# Patient Record
Sex: Male | Born: 1945 | Race: White | Hispanic: No | Marital: Married | Smoking: Former smoker
Health system: Southern US, Community
[De-identification: ages and names within clinical notes are randomized; demographics above are authoritative.]

## PROBLEM LIST (undated history)

## (undated) DIAGNOSIS — U071 COVID-19: Secondary | ICD-10-CM

## (undated) DIAGNOSIS — Z9889 Other specified postprocedural states: Secondary | ICD-10-CM

## (undated) DIAGNOSIS — C801 Malignant (primary) neoplasm, unspecified: Secondary | ICD-10-CM

## (undated) DIAGNOSIS — I2510 Atherosclerotic heart disease of native coronary artery without angina pectoris: Secondary | ICD-10-CM

## (undated) DIAGNOSIS — K219 Gastro-esophageal reflux disease without esophagitis: Secondary | ICD-10-CM

## (undated) DIAGNOSIS — E785 Hyperlipidemia, unspecified: Secondary | ICD-10-CM

## (undated) DIAGNOSIS — Z8673 Personal history of transient ischemic attack (TIA), and cerebral infarction without residual deficits: Secondary | ICD-10-CM

## (undated) DIAGNOSIS — C4490 Unspecified malignant neoplasm of skin, unspecified: Secondary | ICD-10-CM

## (undated) DIAGNOSIS — M1990 Unspecified osteoarthritis, unspecified site: Secondary | ICD-10-CM

## (undated) DIAGNOSIS — N189 Chronic kidney disease, unspecified: Secondary | ICD-10-CM

## (undated) DIAGNOSIS — I219 Acute myocardial infarction, unspecified: Secondary | ICD-10-CM

## (undated) DIAGNOSIS — F32A Depression, unspecified: Secondary | ICD-10-CM

## (undated) DIAGNOSIS — Z87442 Personal history of urinary calculi: Secondary | ICD-10-CM

## (undated) DIAGNOSIS — Z8701 Personal history of pneumonia (recurrent): Secondary | ICD-10-CM

## (undated) DIAGNOSIS — I5032 Chronic diastolic (congestive) heart failure: Secondary | ICD-10-CM

## (undated) DIAGNOSIS — J189 Pneumonia, unspecified organism: Secondary | ICD-10-CM

## (undated) DIAGNOSIS — N19 Unspecified kidney failure: Secondary | ICD-10-CM

## (undated) DIAGNOSIS — I10 Essential (primary) hypertension: Secondary | ICD-10-CM

## (undated) DIAGNOSIS — F4310 Post-traumatic stress disorder, unspecified: Secondary | ICD-10-CM

## (undated) DIAGNOSIS — I340 Nonrheumatic mitral (valve) insufficiency: Secondary | ICD-10-CM

## (undated) DIAGNOSIS — F329 Major depressive disorder, single episode, unspecified: Secondary | ICD-10-CM

## (undated) DIAGNOSIS — I499 Cardiac arrhythmia, unspecified: Secondary | ICD-10-CM

## (undated) DIAGNOSIS — G709 Myoneural disorder, unspecified: Secondary | ICD-10-CM

## (undated) DIAGNOSIS — I4891 Unspecified atrial fibrillation: Secondary | ICD-10-CM

## (undated) DIAGNOSIS — I639 Cerebral infarction, unspecified: Secondary | ICD-10-CM

## (undated) DIAGNOSIS — F419 Anxiety disorder, unspecified: Secondary | ICD-10-CM

## (undated) DIAGNOSIS — Z8719 Personal history of other diseases of the digestive system: Secondary | ICD-10-CM

## (undated) DIAGNOSIS — IMO0001 Reserved for inherently not codable concepts without codable children: Secondary | ICD-10-CM

## (undated) DIAGNOSIS — G4730 Sleep apnea, unspecified: Secondary | ICD-10-CM

## (undated) HISTORY — PX: CATARACT EXTRACTION W/ INTRAOCULAR LENS IMPLANT: SHX1309

## (undated) HISTORY — DX: COVID-19: U07.1

## (undated) HISTORY — DX: Unspecified kidney failure: N19

## (undated) HISTORY — PX: CIRCUMCISION: SUR203

## (undated) HISTORY — PX: CARDIAC CATHETERIZATION: SHX172

## (undated) HISTORY — DX: Sleep apnea, unspecified: G47.30

## (undated) HISTORY — DX: Nonrheumatic mitral (valve) insufficiency: I34.0

## (undated) HISTORY — DX: Cerebral infarction, unspecified: I63.9

## (undated) HISTORY — PX: CORONARY ANGIOPLASTY: SHX604

## (undated) HISTORY — PX: CORONARY STENT PLACEMENT: SHX1402

## (undated) HISTORY — PX: KNEE ARTHROSCOPY W/ MENISCECTOMY: SHX1879

## (undated) HISTORY — DX: Chronic kidney disease, unspecified: N18.9

## (undated) HISTORY — DX: Unspecified malignant neoplasm of skin, unspecified: C44.90

## (undated) HISTORY — PX: COLONOSCOPY: SHX174

---

## 1983-08-25 HISTORY — PX: BLEPHAROPLASTY: SUR158

## 1993-08-24 HISTORY — PX: CORONARY ARTERY BYPASS GRAFT: SHX141

## 1997-11-22 ENCOUNTER — Encounter (HOSPITAL_COMMUNITY): Admission: RE | Admit: 1997-11-22 | Discharge: 1998-02-20 | Payer: Self-pay | Admitting: Cardiology

## 1998-01-15 ENCOUNTER — Ambulatory Visit (HOSPITAL_COMMUNITY): Admission: RE | Admit: 1998-01-15 | Discharge: 1998-01-15 | Payer: Self-pay | Admitting: Neurosurgery

## 1999-08-05 ENCOUNTER — Ambulatory Visit (HOSPITAL_COMMUNITY): Admission: RE | Admit: 1999-08-05 | Discharge: 1999-08-05 | Payer: Self-pay | Admitting: Cardiology

## 1999-09-01 ENCOUNTER — Encounter (INDEPENDENT_AMBULATORY_CARE_PROVIDER_SITE_OTHER): Payer: Self-pay

## 1999-09-01 ENCOUNTER — Ambulatory Visit (HOSPITAL_COMMUNITY): Admission: RE | Admit: 1999-09-01 | Discharge: 1999-09-01 | Payer: Self-pay | Admitting: Plastic Surgery

## 1999-09-12 ENCOUNTER — Ambulatory Visit (HOSPITAL_COMMUNITY): Admission: RE | Admit: 1999-09-12 | Discharge: 1999-09-12 | Payer: Self-pay | Admitting: Cardiology

## 2000-02-08 ENCOUNTER — Ambulatory Visit (HOSPITAL_BASED_OUTPATIENT_CLINIC_OR_DEPARTMENT_OTHER): Admission: RE | Admit: 2000-02-08 | Discharge: 2000-02-08 | Payer: Self-pay | Admitting: Internal Medicine

## 2000-03-11 ENCOUNTER — Ambulatory Visit (HOSPITAL_COMMUNITY): Admission: RE | Admit: 2000-03-11 | Discharge: 2000-03-11 | Payer: Self-pay | Admitting: Internal Medicine

## 2000-11-29 ENCOUNTER — Encounter: Payer: Self-pay | Admitting: Orthopedic Surgery

## 2000-11-29 ENCOUNTER — Encounter: Admission: RE | Admit: 2000-11-29 | Discharge: 2000-11-29 | Payer: Self-pay | Admitting: Orthopedic Surgery

## 2000-12-06 ENCOUNTER — Ambulatory Visit (HOSPITAL_COMMUNITY): Admission: RE | Admit: 2000-12-06 | Discharge: 2000-12-06 | Payer: Self-pay | Admitting: Cardiology

## 2000-12-06 ENCOUNTER — Encounter: Payer: Self-pay | Admitting: Cardiology

## 2000-12-07 ENCOUNTER — Observation Stay (HOSPITAL_COMMUNITY): Admission: RE | Admit: 2000-12-07 | Discharge: 2000-12-08 | Payer: Self-pay | Admitting: Orthopedic Surgery

## 2000-12-15 ENCOUNTER — Encounter: Admission: RE | Admit: 2000-12-15 | Discharge: 2001-03-15 | Payer: Self-pay | Admitting: Orthopedic Surgery

## 2001-04-18 ENCOUNTER — Encounter: Admission: RE | Admit: 2001-04-18 | Discharge: 2001-04-18 | Payer: Self-pay | Admitting: Orthopedic Surgery

## 2001-04-18 ENCOUNTER — Encounter: Payer: Self-pay | Admitting: Orthopedic Surgery

## 2001-04-19 ENCOUNTER — Ambulatory Visit (HOSPITAL_BASED_OUTPATIENT_CLINIC_OR_DEPARTMENT_OTHER): Admission: RE | Admit: 2001-04-19 | Discharge: 2001-04-19 | Payer: Self-pay | Admitting: Orthopedic Surgery

## 2002-08-21 ENCOUNTER — Encounter: Payer: Self-pay | Admitting: Orthopedic Surgery

## 2002-08-24 HISTORY — PX: OTHER SURGICAL HISTORY: SHX169

## 2002-08-28 ENCOUNTER — Inpatient Hospital Stay (HOSPITAL_COMMUNITY): Admission: RE | Admit: 2002-08-28 | Discharge: 2002-08-31 | Payer: Self-pay | Admitting: Orthopedic Surgery

## 2002-09-07 ENCOUNTER — Encounter: Admission: RE | Admit: 2002-09-07 | Discharge: 2002-10-23 | Payer: Self-pay | Admitting: Orthopedic Surgery

## 2003-06-08 ENCOUNTER — Ambulatory Visit (HOSPITAL_COMMUNITY): Admission: RE | Admit: 2003-06-08 | Discharge: 2003-06-08 | Payer: Self-pay | Admitting: Cardiology

## 2003-06-12 ENCOUNTER — Encounter: Admission: RE | Admit: 2003-06-12 | Discharge: 2003-09-10 | Payer: Self-pay | Admitting: Family Medicine

## 2004-06-26 ENCOUNTER — Emergency Department (HOSPITAL_COMMUNITY): Admission: EM | Admit: 2004-06-26 | Discharge: 2004-06-26 | Payer: Self-pay | Admitting: Emergency Medicine

## 2004-08-04 ENCOUNTER — Ambulatory Visit: Payer: Self-pay | Admitting: Cardiology

## 2005-04-15 ENCOUNTER — Ambulatory Visit (HOSPITAL_COMMUNITY): Admission: RE | Admit: 2005-04-15 | Discharge: 2005-04-15 | Payer: Self-pay | Admitting: Gastroenterology

## 2005-07-10 ENCOUNTER — Ambulatory Visit: Payer: Self-pay | Admitting: Cardiology

## 2005-07-20 ENCOUNTER — Ambulatory Visit: Payer: Self-pay

## 2005-08-12 ENCOUNTER — Ambulatory Visit: Payer: Self-pay | Admitting: Cardiology

## 2005-08-29 ENCOUNTER — Emergency Department (HOSPITAL_COMMUNITY): Admission: EM | Admit: 2005-08-29 | Discharge: 2005-08-30 | Payer: Self-pay | Admitting: Emergency Medicine

## 2005-09-21 ENCOUNTER — Ambulatory Visit: Payer: Self-pay | Admitting: Cardiology

## 2005-09-29 ENCOUNTER — Ambulatory Visit: Payer: Self-pay

## 2005-09-29 ENCOUNTER — Encounter: Payer: Self-pay | Admitting: Internal Medicine

## 2005-10-02 ENCOUNTER — Ambulatory Visit: Payer: Self-pay | Admitting: Cardiology

## 2005-10-09 ENCOUNTER — Ambulatory Visit: Payer: Self-pay | Admitting: Cardiology

## 2005-11-02 ENCOUNTER — Ambulatory Visit (HOSPITAL_COMMUNITY): Admission: RE | Admit: 2005-11-02 | Discharge: 2005-11-02 | Payer: Self-pay | Admitting: Family Medicine

## 2005-11-02 ENCOUNTER — Emergency Department (HOSPITAL_COMMUNITY): Admission: EM | Admit: 2005-11-02 | Discharge: 2005-11-02 | Payer: Self-pay | Admitting: Family Medicine

## 2005-12-28 ENCOUNTER — Ambulatory Visit: Payer: Self-pay | Admitting: Cardiology

## 2006-05-07 ENCOUNTER — Encounter: Admission: RE | Admit: 2006-05-07 | Discharge: 2006-05-07 | Payer: Self-pay | Admitting: Gastroenterology

## 2006-09-14 ENCOUNTER — Ambulatory Visit: Payer: Self-pay | Admitting: Cardiology

## 2007-02-24 ENCOUNTER — Ambulatory Visit: Payer: Self-pay | Admitting: Cardiology

## 2007-12-05 DIAGNOSIS — E1159 Type 2 diabetes mellitus with other circulatory complications: Secondary | ICD-10-CM | POA: Insufficient documentation

## 2007-12-05 DIAGNOSIS — I10 Essential (primary) hypertension: Secondary | ICD-10-CM

## 2007-12-05 DIAGNOSIS — E1165 Type 2 diabetes mellitus with hyperglycemia: Secondary | ICD-10-CM

## 2007-12-05 DIAGNOSIS — E669 Obesity, unspecified: Secondary | ICD-10-CM | POA: Insufficient documentation

## 2007-12-05 DIAGNOSIS — G4733 Obstructive sleep apnea (adult) (pediatric): Secondary | ICD-10-CM

## 2007-12-05 DIAGNOSIS — R69 Illness, unspecified: Secondary | ICD-10-CM | POA: Insufficient documentation

## 2007-12-05 DIAGNOSIS — E785 Hyperlipidemia, unspecified: Secondary | ICD-10-CM

## 2007-12-05 DIAGNOSIS — M1990 Unspecified osteoarthritis, unspecified site: Secondary | ICD-10-CM | POA: Insufficient documentation

## 2007-12-05 DIAGNOSIS — I2510 Atherosclerotic heart disease of native coronary artery without angina pectoris: Secondary | ICD-10-CM | POA: Insufficient documentation

## 2007-12-06 ENCOUNTER — Ambulatory Visit: Payer: Self-pay | Admitting: Internal Medicine

## 2007-12-18 DIAGNOSIS — J309 Allergic rhinitis, unspecified: Secondary | ICD-10-CM | POA: Insufficient documentation

## 2007-12-18 DIAGNOSIS — I219 Acute myocardial infarction, unspecified: Secondary | ICD-10-CM | POA: Insufficient documentation

## 2008-03-06 ENCOUNTER — Ambulatory Visit: Payer: Self-pay | Admitting: Cardiology

## 2008-03-13 ENCOUNTER — Ambulatory Visit: Payer: Self-pay

## 2008-03-21 ENCOUNTER — Ambulatory Visit: Payer: Self-pay | Admitting: Cardiology

## 2008-03-21 LAB — CONVERTED CEMR LAB
BUN: 17 mg/dL (ref 6–23)
Basophils Absolute: 0 10*3/uL (ref 0.0–0.1)
CO2: 29 meq/L (ref 19–32)
Calcium: 9.6 mg/dL (ref 8.4–10.5)
Chloride: 101 meq/L (ref 96–112)
Eosinophils Relative: 5.2 % — ABNORMAL HIGH (ref 0.0–5.0)
GFR calc non Af Amer: 65 mL/min
Glucose, Bld: 73 mg/dL (ref 70–99)
HCT: 40.2 % (ref 39.0–52.0)
Hemoglobin: 13.6 g/dL (ref 13.0–17.0)
INR: 1 (ref 0.8–1.0)
Lymphocytes Relative: 21.8 % (ref 12.0–46.0)
MCHC: 33.9 g/dL (ref 30.0–36.0)
Monocytes Relative: 6.5 % (ref 3.0–12.0)
Platelets: 247 10*3/uL (ref 150–400)
Potassium: 4 meq/L (ref 3.5–5.1)
Prothrombin Time: 12.4 s (ref 10.9–13.3)
RBC: 4.28 M/uL (ref 4.22–5.81)
Sodium: 140 meq/L (ref 135–145)
WBC: 6.3 10*3/uL (ref 4.5–10.5)

## 2008-03-22 ENCOUNTER — Ambulatory Visit: Payer: Self-pay | Admitting: Cardiology

## 2008-03-22 ENCOUNTER — Inpatient Hospital Stay (HOSPITAL_BASED_OUTPATIENT_CLINIC_OR_DEPARTMENT_OTHER): Admission: RE | Admit: 2008-03-22 | Discharge: 2008-03-22 | Payer: Self-pay | Admitting: Cardiology

## 2008-04-18 ENCOUNTER — Ambulatory Visit: Payer: Self-pay | Admitting: Cardiology

## 2008-09-20 ENCOUNTER — Ambulatory Visit (HOSPITAL_COMMUNITY): Admission: RE | Admit: 2008-09-20 | Discharge: 2008-09-20 | Payer: Self-pay | Admitting: General Surgery

## 2008-10-10 ENCOUNTER — Encounter: Admission: RE | Admit: 2008-10-10 | Discharge: 2009-01-08 | Payer: Self-pay | Admitting: General Surgery

## 2008-11-15 DIAGNOSIS — I25810 Atherosclerosis of coronary artery bypass graft(s) without angina pectoris: Secondary | ICD-10-CM

## 2008-11-16 ENCOUNTER — Encounter: Payer: Self-pay | Admitting: Cardiology

## 2009-01-15 ENCOUNTER — Ambulatory Visit (HOSPITAL_COMMUNITY): Admission: RE | Admit: 2009-01-15 | Discharge: 2009-01-16 | Payer: Self-pay | Admitting: *Deleted

## 2009-01-15 HISTORY — PX: UMBILICAL HERNIA REPAIR: SHX196

## 2009-01-15 HISTORY — PX: LAPAROSCOPIC GASTRIC BANDING: SHX1100

## 2009-01-29 ENCOUNTER — Encounter: Admission: RE | Admit: 2009-01-29 | Discharge: 2009-04-29 | Payer: Self-pay | Admitting: General Surgery

## 2009-09-03 ENCOUNTER — Ambulatory Visit (HOSPITAL_COMMUNITY): Admission: RE | Admit: 2009-09-03 | Discharge: 2009-09-03 | Payer: Self-pay | Admitting: Gastroenterology

## 2009-11-12 ENCOUNTER — Ambulatory Visit: Payer: Self-pay | Admitting: Cardiology

## 2009-11-19 ENCOUNTER — Encounter: Admission: RE | Admit: 2009-11-19 | Discharge: 2009-12-31 | Payer: Self-pay | Admitting: Family Medicine

## 2010-03-11 ENCOUNTER — Telehealth (INDEPENDENT_AMBULATORY_CARE_PROVIDER_SITE_OTHER): Payer: Self-pay | Admitting: *Deleted

## 2010-03-24 ENCOUNTER — Telehealth (INDEPENDENT_AMBULATORY_CARE_PROVIDER_SITE_OTHER): Payer: Self-pay | Admitting: *Deleted

## 2010-05-29 ENCOUNTER — Encounter (INDEPENDENT_AMBULATORY_CARE_PROVIDER_SITE_OTHER): Payer: Self-pay | Admitting: *Deleted

## 2010-05-29 ENCOUNTER — Ambulatory Visit: Payer: Self-pay | Admitting: Cardiology

## 2010-05-29 DIAGNOSIS — G459 Transient cerebral ischemic attack, unspecified: Secondary | ICD-10-CM | POA: Insufficient documentation

## 2010-05-29 DIAGNOSIS — R5383 Other fatigue: Secondary | ICD-10-CM

## 2010-05-29 DIAGNOSIS — R5381 Other malaise: Secondary | ICD-10-CM

## 2010-06-06 ENCOUNTER — Ambulatory Visit: Payer: Self-pay | Admitting: Cardiology

## 2010-06-06 LAB — CONVERTED CEMR LAB
Basophils Relative: 1 % (ref 0.0–3.0)
Calcium: 9.4 mg/dL (ref 8.4–10.5)
Eosinophils Relative: 6.6 % — ABNORMAL HIGH (ref 0.0–5.0)
GFR calc non Af Amer: 74.52 mL/min (ref >60)
Hemoglobin: 14.1 g/dL (ref 13.0–17.0)
MCHC: 34.5 g/dL (ref 30.0–36.0)
Monocytes Relative: 5.5 % (ref 3.0–12.0)
Neutrophils Relative %: 67.2 % (ref 43.0–77.0)
Platelets: 201 10*3/uL (ref 150.0–400.0)

## 2010-06-11 ENCOUNTER — Inpatient Hospital Stay (HOSPITAL_BASED_OUTPATIENT_CLINIC_OR_DEPARTMENT_OTHER): Admission: RE | Admit: 2010-06-11 | Discharge: 2010-06-11 | Payer: Self-pay | Admitting: Cardiology

## 2010-06-11 ENCOUNTER — Ambulatory Visit: Payer: Self-pay | Admitting: Cardiology

## 2010-07-01 ENCOUNTER — Ambulatory Visit: Payer: Self-pay

## 2010-07-09 ENCOUNTER — Ambulatory Visit: Payer: Self-pay

## 2010-07-09 ENCOUNTER — Ambulatory Visit: Payer: Self-pay | Admitting: Cardiology

## 2010-07-09 DIAGNOSIS — I6529 Occlusion and stenosis of unspecified carotid artery: Secondary | ICD-10-CM | POA: Insufficient documentation

## 2010-07-11 ENCOUNTER — Ambulatory Visit: Payer: Self-pay | Admitting: Cardiology

## 2010-07-16 LAB — CONVERTED CEMR LAB
ALT: 23 units/L (ref 0–53)
AST: 29 units/L (ref 0–37)
Alkaline Phosphatase: 62 units/L (ref 39–117)
Bilirubin, Direct: 0.2 mg/dL (ref 0.0–0.3)
CK-MB: 2.2 ng/mL (ref 0.3–4.0)
LDL Cholesterol: 71 mg/dL (ref 0–99)
Total Bilirubin: 1.2 mg/dL (ref 0.3–1.2)
Total CHOL/HDL Ratio: 3
VLDL: 23.6 mg/dL (ref 0.0–40.0)

## 2010-07-21 ENCOUNTER — Telehealth: Payer: Self-pay | Admitting: Cardiology

## 2010-09-25 NOTE — Assessment & Plan Note (Signed)
Summary: yearly   Visit Type:  Follow-up Primary Provider:  C. Kathee Delton  CC:  no complaints.  History of Present Illness: The patient is 65 years old and return for management of CAD. He had remote bypass surgery. In 2009 he had a catheterization was found to have patent grafts. I saw him last a year ago as a preoperative evaluation prior to bariatric surgery. He lost 70 pounds before surgery and another 40 pounds after surgery he is working out regularly and feeling much better.  He has had no chest pain shortness of breath or palpitations.  His other problems include hypertension hyperlipidemia diabetes and obstructive sleep apnea.  Current Medications (verified): 1)  Metformin Hcl 1000 Mg Tabs (Metformin Hcl) .Marland Kitchen.. 1 Tab Once Daily 2)  Low-Dose Aspirin 81 Mg  Tabs (Aspirin) .... Take 1 Tablet By Mouth Once A Day 3)  Furosemide 20 Mg  Tabs (Furosemide) .... Take 1 Tab By Mouth Once Daily 4)  Nitroquick 0.4 Mg  Subl (Nitroglycerin) .... Take By Mouth As Needed 5)  Carvedilol 25 Mg  Tabs (Carvedilol) .... 1/2 Ttab Po  Once Daily 6)  Levitra 20 Mg  Tabs (Vardenafil Hcl) .... As Needed 7)  Hydrocodone-Acetaminophen 5-500 Mg  Tabs (Hydrocodone-Acetaminophen) .... As Needed 8)  Cpap 9)  Testosterrone Inj 200mg  .... Every 7 Days 10)  Bupropion 150mg  .... 1 Once Daily 11)  Lansoprazole 30mg  .... 1 Tab By Mouth Once Daily 12)  Vitamin D 50000 Iu .... Q Weekly 13)  Modafinil 200mg q .... 1 Tab Two Times A Day 14)  Crestor 20 Mg Tabs (Rosuvastatin Calcium) .... 1/2 Tab Once Daily 15)  Cipro .... As Needed 16)  Flexril .... Prn 17)  Metronidazole .... Prn  Allergies (verified): 1)  ! Codeine  Past History:  Past Medical History: Reviewed history from 11/15/2008 and no changes required. Current Problems:  OBESITY (ICD-278.00) DM (ICD-250.00) CORONARY ARTERY DISEASE (ICD-414.00) DEGENERATIVE JOINT DISEASE (ICD-715.90) SLEEP APNEA (ICD-780.57) HYPERTENSION  (ICD-401.9) HYPERLIPIDEMIA (ICD-272.4) CHF  Myocardial Infarction Allergic rhinitis diverticulosis 1. Coronary artery disease status post coronary bypass graft surgery     in 1992 and status post stenting of the diagonal branch of the left     anterior descending with patent grafts and nonobstructive disease     at recent catheterization 8/09. 2. Good left ventricular function. 3. Hypertension. 4. Hyperlipidemia. 5. Sleep apnea. 6. Degenerative joint disease. 7. Obesity. 8. Diabetes.   Vital Signs:  Patient profile:   65 year old male Height:      69 inches Weight:      233 pounds BMI:     34.53 Pulse rate:   50 / minute BP sitting:   130 / 80  (left arm) Cuff size:   large  Vitals Entered By: Lubertha Basque, CNA (November 12, 2009 8:52 AM)  Physical Exam  Additional Exam:  Gen. Well-nourished, in no distress   Neck: No JVD, thyroid not enlarged, no carotid bruits Lungs: No tachypnea, clear without rales, rhonchi or wheezes Cardiovascular: Rhythm regular, PMI not displaced,  heart sounds  normal, no murmurs or gallops, no peripheral edema, pulses normal in all 4 extremities. Abdomen: BS normal, abdomen soft and non-tender without masses or organomegaly, no hepatosplenomegaly. MS: No deformities, no cyanosis or clubbing   Neuro:  No focal sns   Skin:  no lesions    Impression & Recommendations:  Problem # 1:  CAD, AUTOLOGOUS BYPASS GRAFT (ICD-414.02)  His updated medication list for this problem includes:  Low-dose Aspirin 81 Mg Tabs (Aspirin) .Marland Kitchen... Take 1 tablet by mouth once a day    Nitroquick 0.4 Mg Subl (Nitroglycerin) .Marland Kitchen... Take by mouth as needed  His updated medication list for this problem includes:    Low-dose Aspirin 81 Mg Tabs (Aspirin) .Marland Kitchen... Take 1 tablet by mouth once a day    Nitroquick 0.4 Mg Subl (Nitroglycerin) .Marland Kitchen... Take by mouth as needed  Orders: EKG w/ Interpretation (93000)  Problem # 2:  HYPERTENSION (ICD-401.9) This is well controlled on  current medications. His updated medication list for this problem includes:    Low-dose Aspirin 81 Mg Tabs (Aspirin) .Marland Kitchen... Take 1 tablet by mouth once a day    Furosemide 20 Mg Tabs (Furosemide) .Marland Kitchen... Take 1 tab by mouth once daily  Orders: EKG w/ Interpretation (93000)  His updated medication list for this problem includes:    Low-dose Aspirin 81 Mg Tabs (Aspirin) .Marland Kitchen... Take 1 tablet by mouth once a day    Furosemide 20 Mg Tabs (Furosemide) .Marland Kitchen... Take 1 tab by mouth once daily  Problem # 3:  HYPERLIPIDEMIA (ICD-272.4) He had a recent lipid profile that he says was very good. His updated medication list for this problem includes:    Crestor 20 Mg Tabs (Rosuvastatin calcium) .Marland Kitchen... 1/2 tab once daily  Problem # 4:  OBESITY (ICD-278.00) He has lost over 100 pounds on his own and then with bariatric surgery. He is doing very well.  Patient Instructions: 1)  Your physician wants you to follow-up in: 1 year with Dr. Aundra Dubin.  You will receive a reminder letter in the mail two months in advance. If you don't receive a letter, please call our office to schedule the follow-up appointment.

## 2010-09-25 NOTE — Miscellaneous (Signed)
Summary: Orders Update  Clinical Lists Changes  Orders: Added new Test order of TLB-CK-MB (Creatine Kinase MB) (82553-CKMB) - Signed

## 2010-09-25 NOTE — Assessment & Plan Note (Signed)
Summary: eph   Visit Type:  Follow-up Primary Provider:  C. Kathee Delton   History of Present Illness: [name] is 65 years old and came in today for visit after his recent catheterization. He had bypass surgery in the 1990s.He recently had symptoms of fatigue and will consider this and these may be related to his ischemic heart disease. He underwent cardiac catheterization and his graft to the right and circumflex were patent stent in the diagonal is patent but he had disease in the distal diagonal which had not changed. Overall his anatomy had not changed and we recommended medical therapy.  He has done well since his catheterization procedure. He had no chest pain and the fatigue is better.  He had carotid studies done today.  His other problems include hypertension, hyperlipidemia, diabetes, and obstructive sleep apnea.  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  Problems Prior to Update: 1)  Carotid Artery Stenosis  (ICD-433.10) 2)  Tia  (ICD-435.9) 3)  Fatigue / Malaise  (ICD-780.79) 4)  Preoperative Examination  (ICD-V72.84) 5)  Cad, Autologous Bypass Graft  (ICD-414.02) 6)  Allergic Rhinitis With Conjunctivitis  (ICD-477.9) 7)  Hx of Myocardial Infarction  (ICD-410.90) 8)  Obesity  (ICD-278.00) 9)  Dm  (ICD-250.00) 10)  Coronary Artery Disease  (ICD-414.00) 11)  Degenerative Joint Disease  (ICD-715.90) 12)  Sleep Apnea  (ICD-780.57) 13)  Hypertension  (ICD-401.9) 14)  Hyperlipidemia  (ICD-272.4)  Current Medications (verified): 1)  Metformin Hcl 1000 Mg Tabs (Metformin Hcl) .Marland Kitchen.. 1 Tab Two Times A Day 2)  Low-Dose Aspirin 81 Mg  Tabs (Aspirin) .... Take 1 Tablet By Mouth Once A Day 3)  Furosemide 20 Mg  Tabs (Furosemide) .... Take 1 Tab By Mouth Once Daily 4)  Carvedilol 25 Mg  Tabs (Carvedilol) .... 1/2 Tab Two Times A Day 5)  Levitra 20 Mg  Tabs (Vardenafil Hcl) .... As Needed 6)   Hydrocodone-Acetaminophen 5-500 Mg  Tabs (Hydrocodone-Acetaminophen) .... As Needed 7)  Cpap 8)  Testosterrone Inj 200mg  .... Every 7 Days 9)  Bupropion 150mg  .... 1 Once Daily 10)  Lansoprazole 30mg  .... 1 Tab By Mouth Once Daily(Prevacid) 11)  Modafinil 200mg q .... 1 Tab Once Daily 12)  Crestor 20 Mg Tabs (Rosuvastatin Calcium) .... 1/2 Tab Once Daily 13)  Cipro .... As Needed 14)  Flexril .... Prn 15)  Metronidazole .... Prn 16)  Proscar 5 Mg Tabs (Finasteride) .Marland Kitchen.. 1 Tab Qhs  Allergies: 1)  ! Codeine  Past History:  Past Medical History: Reviewed history from 11/15/2008 and no changes required. Current Problems:  OBESITY (ICD-278.00) DM (ICD-250.00) CORONARY ARTERY DISEASE (ICD-414.00) DEGENERATIVE JOINT DISEASE (ICD-715.90) SLEEP APNEA (ICD-780.57) HYPERTENSION (ICD-401.9) HYPERLIPIDEMIA (ICD-272.4) CHF  Myocardial Infarction Allergic rhinitis diverticulosis 1. Coronary artery disease status post coronary bypass graft surgery     in 1992 and status post stenting of the diagonal branch of the left     anterior descending with patent grafts and nonobstructive disease     at recent catheterization 8/09. 2. Good left ventricular function. 3. Hypertension. 4. Hyperlipidemia. 5. Sleep apnea. 6. Degenerative joint disease. 7. Obesity. 8. Diabetes.   Review of Systems       ROS is negative except as outlined in HPI.   Vital Signs:  Patient profile:   65 year old male Height:      69 inches Weight:      227 pounds BMI:  33.64 Pulse rate:   61 / minute BP sitting:   110 / 72  (left arm)  Vitals Entered By: Margaretmary Bayley CMA (July 09, 2010 3:00 PM)  Physical Exam  Additional Exam:  Gen. Well-nourished, in no distress   Neck: No JVD, thyroid not enlarged, no carotid bruits Lungs: No tachypnea, clear without rales, rhonchi or wheezes Cardiovascular: Rhythm regular, PMI not displaced,  heart sounds  normal, no murmurs or gallops, no peripheral edema, pulses  normal in all 4 extremities. Abdomen: BS normal, abdomen soft and non-tender without masses or organomegaly, no hepatosplenomegaly. MS: No deformities, no cyanosis or clubbing   Neuro:  No focal sns   Skin:  no lesions    Impression & Recommendations:  Problem # 1:  CAD, AUTOLOGOUS BYPASS GRAFT (ICD-414.02) He had previous bypass surgery. He had a recent catheterization for evaluation of symptoms of fatigue and some chest pain and this showed patent grafts and no major obstructive disease although there was some distal diagonal disease. His problems were stable.   His updated medication list for this problem includes:    Low-dose Aspirin 81 Mg Tabs (Aspirin) .Marland Kitchen... Take 1 tablet by mouth once a day  Problem # 2:  HYPERTENSION (ICD-401.9)  This is well controlled on current medications. His updated medication list for this problem includes:    Low-dose Aspirin 81 Mg Tabs (Aspirin) .Marland Kitchen... Take 1 tablet by mouth once a day    Furosemide 20 Mg Tabs (Furosemide) .Marland Kitchen... Take 1 tab by mouth once daily  His updated medication list for this problem includes:    Low-dose Aspirin 81 Mg Tabs (Aspirin) .Marland Kitchen... Take 1 tablet by mouth once a day    Furosemide 20 Mg Tabs (Furosemide) .Marland Kitchen... Take 1 tab by mouth once daily  Problem # 3:  CAROTID ARTERY STENOSIS (ICD-433.10)  He had carotid studies today which showed 0-39% stenosis bilaterally. His updated medication list for this problem includes:    Low-dose Aspirin 81 Mg Tabs (Aspirin) .Marland Kitchen... Take 1 tablet by mouth once a day  His updated medication list for this problem includes:    Low-dose Aspirin 81 Mg Tabs (Aspirin) .Marland Kitchen... Take 1 tablet by mouth once a day  His updated medication list for this problem includes:    Low-dose Aspirin 81 Mg Tabs (Aspirin) .Marland Kitchen... Take 1 tablet by mouth once a day  Problem # 4:  HYPERLIPIDEMIA (ICD-272.4) A. fasting lipid profile and also get a CK and MB to rule out myopathy. His updated medication list for this  problem includes:    Crestor 20 Mg Tabs (Rosuvastatin calcium) .Marland Kitchen... 1/2 tab once daily  His updated medication list for this problem includes:    Crestor 20 Mg Tabs (Rosuvastatin calcium) .Marland Kitchen... 1/2 tab once daily  Patient Instructions: 1)  Your physician recommends that you continue on your current medications as directed. Please refer to the Current Medication list given to you today. 2)  Your physician wants you to follow-up in: 1 year with Dr. Aundra Dubin.  You will receive a reminder letter in the mail two months in advance. If you don't receive a letter, please call our office to schedule the follow-up appointment. 3)  Return for FASTING labwork on a day that is good for you: lipid/liver/CK-MB (414.01).

## 2010-09-25 NOTE — Assessment & Plan Note (Signed)
Summary: chest pains   Visit Type:  Follow-up Primary Giavanni Zeitlin:  C. Melinda Crutch Eqgle  CC:  chest pain.  History of Present Illness: [name] is 65 years old and came in today for an unscheduled visit because of symptoms of chest pain fatigue and intermittent altered mental status. He had bypass surgery in the 1990s. In 2090 and abnormal Myoview scan and underwent cardiac catheterization and was found to have patent grafts. He had a stent to a diagonal branch of the LAD that was patent. He had 70% narrowing the distal LAD. He had bariatric surgery sometime shortly after that.  He had done well until recently. Over the past several months he has had trouble with increased fatigue. He also has exhaustion. He has had chest pain but it is not clearly related to exertion. About 3 months ago had an episode of slurred speech and alter mental status which responded to sugar and this was attributed to a low sugar.  His other problems include hypertension, hyperlipidemia, diabetes, and obstructive sleep apnea.  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  Current Medications (verified): 1)  Metformin Hcl 1000 Mg Tabs (Metformin Hcl) .Marland Kitchen.. 1 Tab Two Times A Day 2)  Low-Dose Aspirin 81 Mg  Tabs (Aspirin) .... Take 1 Tablet By Mouth Once A Day 3)  Furosemide 20 Mg  Tabs (Furosemide) .... Take 1 Tab By Mouth Once Daily 4)  Carvedilol 25 Mg  Tabs (Carvedilol) .... 1/2 Tab Two Times A Day 5)  Levitra 20 Mg  Tabs (Vardenafil Hcl) .... As Needed 6)  Hydrocodone-Acetaminophen 5-500 Mg  Tabs (Hydrocodone-Acetaminophen) .... As Needed 7)  Cpap 8)  Testosterrone Inj 200mg  .... Every 7 Days 9)  Bupropion 150mg  .... 1 Once Daily 10)  Lansoprazole 30mg  .... 1 Tab By Mouth Once Daily(Prevacid) 11)  Modafinil 200mg q .... 1 Tab Once Daily 12)  Crestor 20 Mg Tabs (Rosuvastatin Calcium) .... 1/2 Tab Once Daily 13)  Cipro .... As Needed 14)   Flexril .... Prn 15)  Metronidazole .... Prn 16)  Proscar 5 Mg Tabs (Finasteride) .Marland Kitchen.. 1 Tab Qhs  Allergies (verified): 1)  ! Codeine  Past History:  Past Medical History: Reviewed history from 11/15/2008 and no changes required. Current Problems:  OBESITY (ICD-278.00) DM (ICD-250.00) CORONARY ARTERY DISEASE (ICD-414.00) DEGENERATIVE JOINT DISEASE (ICD-715.90) SLEEP APNEA (ICD-780.57) HYPERTENSION (ICD-401.9) HYPERLIPIDEMIA (ICD-272.4) CHF  Myocardial Infarction Allergic rhinitis diverticulosis 1. Coronary artery disease status post coronary bypass graft surgery     in 1992 and status post stenting of the diagonal branch of the left     anterior descending with patent grafts and nonobstructive disease     at recent catheterization 8/09. 2. Good left ventricular function. 3. Hypertension. 4. Hyperlipidemia. 5. Sleep apnea. 6. Degenerative joint disease. 7. Obesity. 8. Diabetes.   Review of Systems       ROS is negative except as outlined in HPI.   Vital Signs:  Patient profile:   65 year old male Height:      69 inches Weight:      229 pounds BMI:     33.94 Pulse rate:   56 / minute BP sitting:   122 / 76  (left arm) Cuff size:   large  Vitals Entered By: Lubertha Basque, CNA (May 29, 2010 4:32 PM)  Physical Exam  Additional Exam:  Gen. Well-nourished, in no distress   Neck: No JVD, thyroid not  enlarged, no carotid bruits Lungs: No tachypnea, clear without rales, rhonchi or wheezes Cardiovascular: Rhythm regular, PMI not displaced,  heart sounds  normal, no murmurs or gallops, no peripheral edema, pulses normal in all 4 extremities. Abdomen: BS normal, abdomen soft and non-tender without masses or organomegaly, no hepatosplenomegaly. MS: No deformities, no cyanosis or clubbing   Neuro:  No focal sns   Skin:  no lesions    Impression & Recommendations:  Problem # 1:  FATIGUE / MALAISE (ICD-780.79) He has had generalized symptoms of fatigue and  decreased exercise tolerance. He also has had chest pain although this is nonexertional. He and his wife are concerned this could be his heart. In the past Myoview scans have not been helpful. We will plan further evaluation with cardiac catheterization we'll probably do a right and left heart catheterization.  Problem # 2:  CORONARY ARTERY DISEASE (ICD-414.00) He had previous bypass surgery in the 90s and had a stent to the diagonal branch of LAD and had patent grafts and nonobstructive disease at catheterization in 2009. He is having symptoms that could be ischemic and we plan evaluation as outlined above. The following medications were removed from the medication list:    Nitroquick 0.4 Mg Subl (Nitroglycerin) .Marland Kitchen... Take by mouth as needed His updated medication list for this problem includes:    Low-dose Aspirin 81 Mg Tabs (Aspirin) .Marland Kitchen... Take 1 tablet by mouth once a day  The following medications were removed from the medication list:    Nitroquick 0.4 Mg Subl (Nitroglycerin) .Marland Kitchen... Take by mouth as needed His updated medication list for this problem includes:    Low-dose Aspirin 81 Mg Tabs (Aspirin) .Marland Kitchen... Take 1 tablet by mouth once a day  Problem # 3:  OBESITY (ICD-278.00) He has obesity that has been treated with very active surgery. This was successful although has gained some weight recently. He feels some of its fluid.  Problem # 4:  HYPERTENSION (ICD-401.9) This is controlled on current medications. His updated medication list for this problem includes:    Low-dose Aspirin 81 Mg Tabs (Aspirin) .Marland Kitchen... Take 1 tablet by mouth once a day    Furosemide 20 Mg Tabs (Furosemide) .Marland Kitchen... Take 1 tab by mouth once daily  His updated medication list for this problem includes:    Low-dose Aspirin 81 Mg Tabs (Aspirin) .Marland Kitchen... Take 1 tablet by mouth once a day    Furosemide 20 Mg Tabs (Furosemide) .Marland Kitchen... Take 1 tab by mouth once daily  Problem # 5:  TIA (ICD-435.9) Get some symptoms of slurred  speech and altered mental status about 3 months ago. This was attributed to low blood sugar although this was not documented. He still has some intermittent periods of memory problems or altered mental status. We will plan to get carotid Doppler studies.  Other Orders: EKG w/ Interpretation (93000) Carotid Duplex (Carotid Duplex) T-2 View CXR (71020TC) Cardiac Catheterization (Cardiac Cath)  Patient Instructions: 1)  You will need FASTING labwork prior to your heart cath: bmet/lipid/liver/HgbA1C/cbc/pt/ptt (786.50;414.01) 2)  A chest x-ray takes a picture of the organs and structures inside the chest, including the heart, lungs, and blood vessels. This test can show several things, including, whether the heart is enlarged; whether fluid is building up in the lungs; and whether pacemaker / defibrillator leads are still in place. 3)  Your physician recommends that you schedule a follow-up appointment in: 3 weeks. 4)  Your physician has requested that you have a cardiac catheterization.  Cardiac catheterization  is used to diagnose and/or treat various heart conditions. Doctors may recommend this procedure for a number of different reasons. The most common reason is to evaluate chest pain. Chest pain can be a symptom of coronary artery disease (CAD), and cardiac catheterization can show whether plaque is narrowing or blocking your heart's arteries. This procedure is also used to evaluate the valves, as well as measure the blood flow and oxygen levels in different parts of your heart.  For further information please visit [email or web address].  Please follow instruction sheet, as given. 5)  Your physician has requested that you have a carotid duplex. This test is an ultrasound of the carotid arteries in your neck. It looks at blood flow through these arteries that supply the brain with blood. Allow one hour for this exam. There are no restrictions or special instructions.

## 2010-09-25 NOTE — Letter (Signed)
Summary: Cardiac Catheterization Instructions- [place], [place]  [id number] [place]   [place], [place] [address]   Phone: [phone]  Fax: [phone]     05/29/2010 MRN: [id number]  JAVIEN MULLER [place], [place]  [address]  Dear Mr. WEICK,   You are scheduled for a Cardiac Catheterization on Wed. 06/11/10 with Dr.Brodie  Please arrive to the 1st floor of the [facility] at [facility] at 6:30 am on the day of your procedure. Please do not arrive before 6:30 a.m. Call the Heart and Vascular Center at [phone] if you are unable to make your appointmnet. The Code to get into the parking garage under the building is 0200. Take the elevators to the 1st floor. You must have someone to drive you home. Someone must be with you for the first 24 hours after you arrive home. Please wear clothes that are easy to get on and off and wear slip-on shoes. Do not eat or drink after midnight except water with your medications that morning. Bring all your medications and current insurance cards with you.  _x__ DO NOT take these medications before your procedure: ___- Hold metformin the morning of your procedure and 48 hours after._____________________________________________________________  _x__ Make sure you take your aspirin.  _x__ You may take ALL of your other medications with water that morning. ________________________________________________________________________________________________________________________________  ___ DO NOT take ANY medications before your procedure.  ___ Pre-med instructions:  ________________________________________________________________________________________________________________________________  The usual length of stay after your procedure is 2 to 3 hours. This can vary.  If you have any questions, please call the office at the number listed above.   Alvis Lemmings, RN,  BSN

## 2010-09-25 NOTE — Progress Notes (Signed)
  Request recieved from [facility] sent to [place]. [name]  March 24, 2010 11:55 AM     Appended Document:  Request recieved from [place] sent to [facility]

## 2010-09-25 NOTE — Progress Notes (Signed)
Summary: returning call re results  Phone Note Call from Patient   Caller: Patient [phone] Reason for Call: Talk to Nurse, Lab or Test Results Summary of Call: pt calling back re results Initial call taken by: Lorenda Hatchet,  July 21, 2010 4:49 PM  Follow-up for Phone Call        Pt. and wife aware of lab results. Follow-up by: Carollee Sires, RN, BSN,  July 21, 2010 5:10 PM

## 2010-09-25 NOTE — Progress Notes (Signed)
  Phone Note Other Incoming   Request: Send information Summary of Call: Request for records received from [facility]. Request forwarded to [facility].     

## 2010-11-05 LAB — POCT I-STAT 3, VENOUS BLOOD GAS (G3P V)
Acid-Base Excess: 3 mmol/L — ABNORMAL HIGH (ref 0.0–2.0)
O2 Saturation: 71 %
pCO2, Ven: 49.6 mmHg (ref 45.0–50.0)
pH, Ven: 7.378 — ABNORMAL HIGH (ref 7.250–7.300)
pO2, Ven: 39 mmHg (ref 30.0–45.0)

## 2010-11-05 LAB — POCT I-STAT 3, ART BLOOD GAS (G3+): TCO2: 29 mmol/L (ref 0–100)

## 2010-11-05 LAB — POCT I-STAT GLUCOSE: Operator id: 194801

## 2010-12-02 LAB — CBC
HCT: 42 % (ref 39.0–52.0)
Hemoglobin: 14.1 g/dL (ref 13.0–17.0)
MCHC: 33.6 g/dL (ref 30.0–36.0)
MCV: 94.9 fL (ref 78.0–100.0)
RBC: 4.42 MIL/uL (ref 4.22–5.81)
RDW: 13.1 % (ref 11.5–15.5)

## 2010-12-02 LAB — GLUCOSE, CAPILLARY
Glucose-Capillary: 111 mg/dL — ABNORMAL HIGH (ref 70–99)
Glucose-Capillary: 115 mg/dL — ABNORMAL HIGH (ref 70–99)
Glucose-Capillary: 152 mg/dL — ABNORMAL HIGH (ref 70–99)
Glucose-Capillary: 96 mg/dL (ref 70–99)

## 2010-12-02 LAB — DIFFERENTIAL
Basophils Absolute: 0 10*3/uL (ref 0.0–0.1)
Basophils Relative: 0 % (ref 0–1)
Eosinophils Absolute: 0 10*3/uL (ref 0.0–0.7)
Eosinophils Relative: 0 % (ref 0–5)
Monocytes Absolute: 0.5 10*3/uL (ref 0.1–1.0)
Monocytes Relative: 6 % (ref 3–12)
Neutro Abs: 6.6 10*3/uL (ref 1.7–7.7)

## 2010-12-02 LAB — COMPREHENSIVE METABOLIC PANEL
Alkaline Phosphatase: 55 U/L (ref 39–117)
Calcium: 10 mg/dL (ref 8.4–10.5)
Chloride: 103 mEq/L (ref 96–112)
GFR calc Af Amer: >60 mL/min (ref >60)
Total Bilirubin: 1.1 mg/dL (ref 0.3–1.2)

## 2010-12-02 LAB — HEMOGLOBIN AND HEMATOCRIT, BLOOD: Hemoglobin: 14.1 g/dL (ref 13.0–17.0)

## 2011-01-06 NOTE — Op Note (Signed)
NAME:  [name], [name] NO.:  [id number]   MEDICAL RECORD NO.:  [id number]          PATIENT TYPE:  OIB   LOCATION:  [place]                         FACILITY:  [facility]   PHYSICIAN:  Marland Kitchen T. Hoxworth, M.D.DATE OF BIRTH:  [birth year]   DATE OF PROCEDURE:  01/15/2009  DATE OF DISCHARGE:                               OPERATIVE REPORT   PREOPERATIVE DIAGNOSES:  1. Morbid obesity.  2. Umbilical hernia.   POSTOPERATIVE DIAGNOSES:  1. Morbid obesity.  2. Umbilical hernia.   SURGICAL PROCEDURES:  1. Placement of laparoscopic adjustable gastric band.  2. Repair of umbilical hernia with mesh.   SURGEON:  Darene Lamer. Hoxworth, M.D.   ASSISTANT:  Fenton Malling. Lucia Gaskins, M.D.   ANESTHESIA:  General.   BRIEF HISTORY:  [name] is a 65 year old male with a longstanding  history of morbid obesity unresponsive to medical management who  presents at a weight of 256 pounds at 5', 10 with BMI of 36.7 and  multiple comorbidities including obstructive sleep apnea, coronary  artery disease, hypertension, elevated cholesterol.  He has actually  managed to lose about 50 pounds prior to this presentation, but he has a  history of repeated weight regain after prior weight loss.  He also has  a symptomatic umbilical hernia enlarging causing intermittent pain and  confirmed on exam.  After extensive preoperative workup and discussion  detailed elsewhere, we have elected proceed with placement of  laparoscopic adjustable gastric band for treatment of his morbid obesity  and also repair of his umbilical hernia.  He currently has comorbidities  of diabetes, sleep apnea coronary artery disease, hypertension and  elevated cholesterol.   DESCRIPTION OF OPERATION:  The patient was brought to the operating  room, placed in supine position on the operating room table and general  endotracheal anesthesia was induced.  The abdomen was widely sterilely  prepped and draped with ChloraPrep.  The  correct patient and procedure  were verified.  He received preoperative IV antibiotics.  Heparin 5000  units had been given subcutaneously.  PAS were placed.  Correct patient  and procedures were verified.  Initially, access was obtained in the  left subcostal space with 11 mm OptiView trocar without difficulty and  pneumoperitoneum established.  Under direct vision a 15 mm trocar was  placed laterally in the right upper quadrant, a 12 mm trocar in the  right upper quadrant midclavicular line and 11 mm trocar above the left  of the umbilicus for camera placement.  An additional 5 mm trocar was  placed in the left flank.  Through a 5 mm subxiphoid site a Nathanson  retractor was placed and the left lobe of liver elevated with good  exposure of the hiatus.  Despite the patient's relatively low BMI there  was a large amount of fat in the gastrohepatic omentum and around the  upper stomach.  We chose an AP large band for this reason.  The hiatus  was tested with a 15 mL balloon on the sizing tube and there was no  evidence of hiatal hernia.  Following this, the gastrohepatic omentum  was opened along a small clear area.  There was quite a bit of fat  around the base of the right crus, but with some additional retraction  the base of the right crus was clearly identified and peritoneum incised  just anterior to this at the area of crossing fat.  The finger dissector  was then passed retrogastric and deployed up through the angle of His  which had been dissected down onto the left crus without difficulty.  The AP large band was then introduced into the abdomen with the tubing  placed in the finger retractor which was then brought back around behind  the stomach.  At this point, we did reduce the amount of fat within the  band by further dividing some of the anterior large fatty component of  the gastrohepatic ligament and also did some defatting of the upper  gastric fat pad as well with  Harmonic scalpel.  Following this, with the  sizing tube in place the AP large band was snapped without difficulty  and with the sizing tube removed there did not appear to be any undue  tightness.  Following this, holding tubing toward the patient's feet the  fundus was imbricated up over the band of the small gastric pouch with  three interrupted 2-0 Ethibond sutures.  They appeared to be in  excellent position and there was no bleeding.  Following this, attention  was turned to the umbilical hernia.  There was some chronically  incarcerated omentum which under laparoscopic vision was completely  reduced.  I then made a small curvilinear incision just beneath the  umbilicus and dissection was carried down to the hernia sac which was  completely dissected away from surrounding tissue and excised with  cautery down to the level of the fascia.  The fascial defect was very  small measuring about a centimeter at most.  I then used a Proceed  ventral patch, 4.2 cm size which was introduced through the fascial  defect and deployed and we could see laparoscopically that there was  excellent uniform deployment of the patch intraperitoneally.  The tags  were then sutured on either side to the edge of the fascial defect with  interrupted zero Prolene and the tags trimmed and removed.  This  provided a nice broad coverage.  The abdomen was again inspect for  hemostasis which appeared complete.  The Nathanson retractor had been  removed prior to beginning the hernia repair.  The tubing was then  brought out through the right mid abdominal trocar site.  All CO2 was  evacuated.  The trocars were removed.  The right mid abdominal trocar  site was lengthened slightly along the skin and the subcutaneous pocket  created.  The tubing was trimmed and attached to the port which had a  piece of Prolene mesh sutured to the back and this was placed in a  subcutaneous position just lateral to this incision with  very good  orientation and the tubing seen to curve smoothly back to the abdomen.  The subcutaneous at this site was closed with running 3-0 Vicryl.  The  skin incisions were closed with running 4-0 Monocryl and Dermabond.  Sponge and needle counts were correct.  The patient was taken to the  recovery room in good condition.      Darene Lamer. Hoxworth, M.D.  Electronically Signed     BTH/MEDQ  D:  01/15/2009  T:  01/15/2009  Job:  [id number]

## 2011-01-06 NOTE — Assessment & Plan Note (Signed)
[place]                            CARDIOLOGY OFFICE NOTE   DOMANIC, BOENDER                       MRN:          [id number]  DATE:03/06/2008                            DOB:          19-Nov-1945    PRIMARY CARE PHYSICIAN:  C. Melinda Crutch, M.D.   CLINICAL HISTORY:  Mr. Bruington is 65 years old and had bypass surgery in  1995, and subclavian stenting of diagonal branch of the LAD.  His last  cath was in 2004, at which time he had patent vein to the right system  and 30% narrowing at the stent site in the diagonal branch, and 70-80%  narrowing in the distal LAD with normal LV function.   He said he has done quite well from the standpoint of his heart.  He has  symptoms of fatigue, but no major shortness of breath and no chest pain  or palpitations.   He is considering abandoning surgical procedure for weight loss and his  wife who is a nurse along with Dr. Harrington Challenger is helping in coordinating this.   His past medical history is significant for hypertension,  hyperlipidemia, sleep apnea, degenerative joint disease, and diabetes.   His current medications include,  1. Hydrochlorothiazide 25 mg daily.  2. Male hormone shots daily.  3. Crestor or 20 mg one-half tablet daily.  4. Aspirin 81 mg daily.  5. Prevacid, metformin, gemfibrozil 180 mg b.i.d.  6. Carvedilol 25 mg one-half tablet b.i.d.  7. Glipizide 5 mg b.i.d..   On examination, the blood pressure was 127/82 and pulse 67 and regular.  His rate was 252, which was down from 265 a year ago.  (He indicated his  weight has gone up to 300 in January).  There was no venous tension.  The carotid pulses were full without bruits.  Chest was clear.  Cardiac  rhythm was regular.  No murmurs or gallops.  The abdomen was soft, no  organomegaly.  Peripheral pulses are full.  No peripheral edema.   Electrocardiogram showed left axis deviation and nonspecific ST-T  changes.   IMPRESSION:  1. Coronary  artery disease status post coronary bypass graft surgery      1992 and status post stenting of the diagonal branch of the LAD      with coronary anatomy as described above.  2. Good left ventricular function.  3. Hypertension.  4. Hyperlipidemia.  5. Type 2 diabetes.  6. Sleep apnea.  7. Degenerative joint disease.  8. Excess weight.   RECOMMENDATIONS:  Mr. Bolam is doing well and stable from a cardiac  standpoint.  He is considering surgery for a lap and surgery for weight  loss.  We will plan to screen him with a rest/stress adenosine scan for  evidence of ischemia.  If this is a low risk angina, I think he should  be okay for surgery.  Will schedule him for a follow up visit in a year.     Bruce Alfonso Patten Olevia Perches, MD, [facility]  Electronically Signed    BRB/MedQ  DD: 03/06/2008  DT: 03/07/2008  Job #:  [id number] 

## 2011-01-06 NOTE — Cardiovascular Report (Signed)
NAME:  [name], [name] NO.:  [id number]   MEDICAL RECORD NO.:  [id number]          PATIENT TYPE:  OIB   LOCATION:  1967                         FACILITY:  [facility]   PHYSICIAN:  Bruce R. Olevia Perches, MD, FACCDATE OF BIRTH:  28-Sep-1945   DATE OF PROCEDURE:  03/22/2008  DATE OF DISCHARGE:  03/22/2008                            CARDIAC CATHETERIZATION   INDICATION:  Positive stress test and pregastric bypass surgery.   PROCEDURE:  Left heart catheterization, left ventriculogram, and left  subclavian angiography.   PROCEDURE:  After informed consent was obtained, the patient was  sterilely prepped and draped, and 1% lidocaine was used to locally  anesthetize the right groin site.  The right common femoral artery was  accessed using Seldinger technique and a 4-French vascular sheath was  placed.  The left coronary artery was engaged with the 4-French JL4  catheter.  The right coronary artery was engaged with the 4-French 3DRC  catheter.  The bypass grafts were engaged with the 4-French right  coronary artery bypass catheter and the subclavian artery was engaged  with the 4-French 3DRC catheter and the ventricle was entered using the  4-French pigtail catheter.   FINDINGS:  1. Left ventriculography.  EF was 65%.  There was mild hypokinesis of      the inferobasal wall, otherwise wall motion was normal.  2. Left subclavian angiography.  The left subclavian was patent      without stenosis.  The LIMA appears to be a very small vessel.  3. Coronary angiography.  The native left main is patent.  The native      LAD is a small vessel with moderate diffuse disease distally.      There is a patent stent in the first diagonal and there is 90%      stenosis distal to the stent in the first diagonal.  This appears      to be unchanged compared to the prior heart catheterization.  The      left circumflex artery is totally occluded proximally.  The right      coronary artery is  totally occluded proximally.  The coronary      system is right dominant.  There is a patent vein graft to the      obtuse marginal which fills the vessel well.  There is a patent      vein graft to the PDA, which fills PDA and that fills a PLV vessel.      There is no significant PDA stenosis.   ASSESSMENT:  Patent vein grafts to the posterior descending coronary  artery and to the obtuse marginal.  The native coronary showed totally  occluded proximal circumflex and right coronary artery.  There is a  stent in the first diagonal that is patent and there is a 90% stenosis  distal to that stent that is unchanged compared to the prior heart  catheterization, and of note, the left anterior descending artery is a  relatively small vessel.  Normal left ventricular systolic function.  Plan will be medical management of coronary artery disease.  The patient  should be able to go forward with his surgery from a cardiovascular  standpoint.      [name], MD   Electronically Signed     ______________________________  Vanna Scotland. Olevia Perches, MD, [facility]    DM/MEDQ  D:  03/22/2008  T:  03/23/2008  Job:  [id number]

## 2011-01-06 NOTE — Assessment & Plan Note (Signed)
[place]                            CARDIOLOGY OFFICE NOTE   [name], [name]                       MRN:          [id number]  DATE:02/24/2007                            DOB:          [birth year]    PRIMARY CARE PHYSICIAN:  C. Melinda Crutch, MD   CLINICAL HISTORY:  [name] is 65 years old and had coronary bypass  graft surgery in 1992 and stenting of a diagonal branch subsequently and  had a patent graft and patent stent in 2004. He has had good LV  function. He was seen in January by Estella Husk with increase in his  blood pressure and she increased his metoprolol and added  hydrochlorothiazide and arranged for a followup with Dr.  Harrington Challenger. He has  done fairly well since that time and has had no recent chest pain,  shortness of breath, or palpitations.   PAST MEDICAL HISTORY:  1. Hypertension.  2. Hyperlipidemia.  3. Sleep apnea.  4. Degenerative joint disease.   CURRENT MEDICATIONS:  1. Metformin.  2. Furosemide.  3. Wellbutrin.  4. Acarbose.  5. Metoprolol.  6. Prevacid.  7. Aspirin.  8. Rosuvastatin.   PHYSICAL EXAMINATION:  Blood pressure 146/93, pulse 63 and regular.  There was no venous distention. The carotid pulses were full without  bruits.  CHEST: Was clear.  CARDIAC: Rhythm was regular. I hear no murmurs or gallops.  ABDOMEN: Was protuberant. There is no hepatosplenomegaly and bowel  sounds were normal.  The pedal pulses were equal and there was no peripheral edema.   An electrocardiogram showed left axis deviation and nonspecific ST-T  changes.   IMPRESSION:  1. Coronary artery disease, status post coronary artery bypass graft      in 1992 and stenting of the diagonal branch to left anterior      descending artery (LAD) with patent graft and patent stent in 2004.  2. Good LV function.  3. Hypertension.  4. Hyperlipidemia.  5. Sleep apnea.  6. Degenerative joint disease.  7. Obesity.  8. Diabetes.   RECOMMENDATIONS:  Mr. Severs appears to be stable from a cardiac  standpoint. He still needs to lose weight and exercise more, but he is  limited by his knee problem and he has really struggled with trying to  lose weight. He asked about getting off of some of his medications. His  blood pressure is not quite at target for a diabetic, so I am reluctant  to cut back on any of those medications. I told him he could take the  Prevacid and furosemide on a p.r.n. basis. He is scheduled to see Dr.  Harrington Challenger in the near future and he will get labs with them and I asked him  to forward those to [place]. I will see him back in followup in a year.     Bruce Alfonso Patten Olevia Perches, MD, [facility]  Electronically Signed    BRB/MedQ  DD: 02/24/2007  DT: 02/24/2007  Job #: [id number]

## 2011-01-06 NOTE — Assessment & Plan Note (Signed)
[place]                            CARDIOLOGY OFFICE NOTE   MAINOR, DEWIT                       MRN:          [id number]  DATE:03/21/2008                            DOB:          26-Mar-1946    I spoke with Mr. Chakraborty by phone today.  He had bypass surgery in 1992  and had patent grafts to his right coronary artery and circumflex artery  in 2004 and a patent stent in the diagonal branch of the LAD, with 70%-  80% stenosis in the distal LAD.  he had normal LV function.  He is  contemplating gastric bypass surgery.  We did a scan on him recently,  and this suggested inferoapical ischemia.  I think the findings appeared  fairly definite to me.  I am suspicious that he may have occluded one of  his grafts.  I think he should be evaluated with coronary angiography,  and I discussed this with him, and will arrange for him to come in  either Thursday or Monday, and plan to do this in the outpatient  laboratory.     Bruce Alfonso Patten Olevia Perches, MD, [facility]  Electronically Signed    BRB/MedQ  DD: 03/21/2008  DT: 03/21/2008  Job #: [id number]

## 2011-01-06 NOTE — Assessment & Plan Note (Signed)
[place]                            CARDIOLOGY OFFICE NOTE   AKING, SHULMAN                       MRN:          [id number]  DATE:04/18/2008                            DOB:          1946/02/03    PRIMARY CARE PHYSICIAN:  C. Melinda Crutch, M.D.   CLINICAL HISTORY:  Mr. Schoof is a 65 years old and returned for  followup management of his coronary heart disease after his recent  catheterization.  He had coronary bypass surgery in 1992 and  subsequently had stenting of his diagonal branch to his LAD.  I saw him  recently for preoperative evaluation prior to gastric banding procedure.  He has had Myoview scan which was borderline abnormal and, we proceeded  to do outpatient cardiac catheterization.  Following  __________catheterization, he was found to have a patent vein graft to  the right coronary artery and patent vein graft to the circumflex artery  and __________LAD with tight lesion distal to the stent.  This had not  changed.  This was a small vessel.  The LV function was good.  We  recommended continue medical management.   He has done fine since his catheterization with no cardiac symptoms.   PAST MEDICAL HISTORY:  Significant for hypertension, hyperlipidemia,  sleep apnea, degenerative joint disease, and excess sweating.   CURRENT MEDICATIONS:  1. Rosuvastatin.  2. Aspirin.  3. Prevacid  4. Wellbutrin.  5. Metformin.  6. Modafinil for narcolepsy.  7. Carvedilol 25 mg one-half tablet b.i.d.  8. Furosemide 20 mg daily.   PHYSICAL EXAMINATION:  VITAL SIGNS:  Today, the blood pressure was  139/84 and the pulse 62 and regular.  NECK:  There was no venous distension.  The carotid pulses were full  without bruits.  CHEST:  Clear.  CARDIAC:  Rhythm was regular.  I cannot hear no murmurs or gallops.  ABDOMEN:  Soft with normal bowel sounds.  EXTREMITIES:  The right femoral artery site was well-healed.  Peripheral  pulses are full.  No  peripheral edema.   IMPRESSION:  1. Coronary artery disease status post coronary bypass graft surgery      in 1992 and status post stenting of the diagonal branch of the left      anterior descending with patent grafts and nonobstructive disease      at recent catheterization.  2. Good left ventricular function.  3. Hypertension.  4. Hyperlipidemia.  5. Sleep apnea.  6. Degenerative joint disease.  7. Obesity.  8. Diabetes.   RECOMMENDATIONS:  I think Mr. Raczynski is doing well.  His cardiac  condition is quite stable and I think he should be okay to proceed with  surgery for a banding procedure for weight loss.  He is not on either  Plavix or Coumadin, so stopping these is not an issue.  I will plan to  see him back in followup in 1 year.  We will not make any changes in  medications.     Bruce Alfonso Patten Olevia Perches, MD, [facility]  Electronically Signed    BRB/MedQ  DD: 04/18/2008  DT: 04/19/2008  Job #: [id number]

## 2011-01-09 NOTE — Cardiovascular Report (Signed)
NAME:  MONTRICE, TIDRICK NO.:  [id number]   MEDICAL RECORD NO.:  [id number]                   PATIENT TYPE:  OIB   LOCATION:  2899                                 [facility]   PHYSICIAN:  Eustace Quail, M.D.                  DATE OF BIRTH:  1946/01/08   DATE OF PROCEDURE:  06/08/2003  DATE OF DISCHARGE:  06/08/2003                              CARDIAC CATHETERIZATION   CLINICAL HISTORY:  Mr. Haith is 65 years old and had bypass surgery in  1992.  He was last catheterized in 2001, at which time had patent grafts to  the right and circumflex arteries.  Recently the patient has developed  symptoms of some chest pain and shortness of breath with exertion.  We did a  Cardiolite scan, which gets an apical ischemia.  For this reason he was  scheduled for catheterization.   DESCRIPTION OF PROCEDURE:  The procedure was performed through the right  femoral artery using an arterial sheath and 6 French preformed coronary  catheter.  A front wall arterial puncture was performed and Omnipaque  contrast was used.  The vein grafts were injected with a JR4 diagnostic  catheter.  Distal aortogram was performed to rule out abdominal aortic  aneurysm.  The patient tolerated the procedure well and left the laboratory  in satisfactory condition.   RESULTS:  1. Left main coronary artery:  The left main coronary is free of significant     disease.  2. Left anterior descending artery:  The left anterior descending gave rise     to a diagonal branch and septal perforator.  There was 30% narrowing at     the stent site in the diagonal branch.  There was 90% stenosis in the     very distal portion of the diagonal branch.  The very distal LAD had 70-     80% stenosis with a small-caliber vessel distally that did not reach the     apex.  3. Circumflex artery:  The circumflex artery gave rise to a small marginal     branch and atrial branch and then was completely occluded  proximally.  4. Right coronary artery:  The right coronary artery is completely occluded     proximally.  5. The saphenous vein graft to the posterior descending branch of the right     coronary artery was patent and filled the posterior descending branch and     a posterolateral branch.  6. The saphenous vein graft to the marginal branch of the circumflex artery     was patent and functioned normally.  7. Left ventriculogram:  The left ventriculogram performed in the RAO     projection showed good wall motion with no evidence of hypokinesis.  The     estimated ejection fraction was 60%.  8. Distal aortogram:  A distal aortogram was performed that showed  patent     renal arteries and no significant aortoiliac obstruction.   CONCLUSION:  1. Coronary artery disease, status post coronary artery bypass graft surgery     in 1992.  2. Severe native vessel disease with total occlusion of the right coronary     artery and circumflex arteries and 30% narrowing in the stent in the     diagonal branch of the left anterior descending artery with 70-80%     stenosis in the very distal portion of the left anterior descending     artery, which was a very small vessel.  3. Patent saphenous vein graft to the posterior descending branch of the     right coronary artery and patent vein graft to the marginal branch of the     circumflex artery.  4. Normal left ventricular function.   RECOMMENDATIONS:  Both grafts are widely patent, and the patient's main  channel in the LAD is still open.  He has small-vessel disease in the very  distal LAD and very distal portion of the diagonal branch.  I am not sure  this is  enough to account for his abdominal Cardiolite scan and symptoms.  It is  possible it could contribute.  Will plan continued medical therapy and  continued secondary risk factor modification.  Will let him go home as an  outpatient today.                                                  Eustace Quail, M.D.    BB/MEDQ  D:  06/08/2003  T:  06/09/2003  Job:  [id number]   cc:   Sherren Kerns. [address], [place]  [place] [address]  Fax: [phone]   Cardiopulmonary Lab

## 2011-01-09 NOTE — Assessment & Plan Note (Signed)
[facility]                            CARDIOLOGY OFFICE NOTE   RADLEE, MANECKE                       MRN:          [id number]  DATE:09/14/2006                            DOB:          1945/12/06    This is a pleasant 65 year old married white male patient who has a  history of coronary artery disease, status post CABG in 1992 and  stenting of a diagonal branch with patent grafts and patent stent in  2004.  The patient called in because his blood pressure has been  elevated recently.  He went to the [place] for an eye checkup, and they  noticed that his blood pressure was up that day.  The past Sunday, he  used his father-in-law's cuff to check, and it was 184/104.  A year back  in May, 2007, his blood pressure medications were lowered because of  hypotension and fatigue.  He denies any significant chest pain or  shortness of breath.  He always has some chronic dyspnea on exertion but  nothing out of the ordinary.  The chest pain is occasionally a stabbing  pain in his left chest into his back that has been present since his  bypass surgery and occurs infrequently.   CURRENT MEDICATIONS:  1. Metformin 850 mg b.i.d.  2. Bupropion XL 150 daily.  3. Provigil 200 mg daily.  4. Rosuvastatin 20 mg 1/2 daily.  5. Aspirin 81 mg daily.  6. Metoprolol 25 mg daily.  7. Prevacid 30 mg daily.   PHYSICAL EXAMINATION:  GENERAL:  This is a pleasant, overweight 1-year-  old white male in no acute distress.  VITAL SIGNS:  Blood pressure 160/94, pulse 63, weight 265, which is  actually down from 279 from May, 2007.  NECK:  Without JVD, HJR, bruits, or thyromegaly.  LUNGS:  Clear anterior, posterior, and lateral.  HEART:  Regular rate and rhythm at 63 beats per minute.  Normal S1 and  S2.  Distant heart sounds.  No significant murmur heard.  ABDOMEN:  Obese.  Normoactive bowel sounds are heard throughout.  EXTREMITIES:  Without clubbing, cyanosis or edema.   There are good  distal pulses.   EKG:  Sinus bradycardia with inferolateral T wave inversion, nonspecific  ST changes.  No acute change.   IMPRESSION:  1. Hypertension.  2. Coronary artery disease, status post coronary artery bypass graft      in 1992 with stenting of the diagonal branch with patent grafts and      patent stent in 2004.  Negative Adenosine in November, 2006.  3. Normal left ventricular function.  4. Hyperlipidemia.  5. History of sleep apnea.  6. Degenerative joint disease.  7. Obesity.  8. Diabetes mellitus.   PLAN:  I have asked the patient to increase his metoprolol to 25 mg  b.i.d. and take his hydrochlorothiazide 25 mg daily.  He is to contact  his medical doctor, Dr. Melinda Crutch for a followup on his blood pressure,  and he will see Dr. Olevia Perches back in May.  He is to call if he has any  further problems.  He will keep track of his blood pressure on a daily  basis.      Ermalinda Barrios, PA-C  Electronically Signed      Vanna Scotland. Olevia Perches, MD, [facility]  Electronically Signed   ML/MedQ  DD: 09/14/2006  DT: 09/14/2006  Job #: [id number]

## 2011-01-09 NOTE — Op Note (Signed)
NAME:  [name], [name] NO.:  [id number]   MEDICAL RECORD NO.:  [id number]          PATIENT TYPE:  AMB   LOCATION:  ENDO                         FACILITY:  [facility]   PHYSICIAN:  Jeryl Columbia, M.D.    DATE OF BIRTH:  04-09-1946   DATE OF PROCEDURE:  04/15/2005  DATE OF DISCHARGE:                                 OPERATIVE REPORT   PROCEDURE:  Colonoscopy.   INDICATIONS FOR PROCEDURE:  Recurrent mild diverticulitis, patient due for  colonic screening.   Consent was signed after risks, benefits, methods, and options were  thoroughly discussed in the office.   MEDICINES USED:  Demerol 70, Versed 7.   DESCRIPTION OF PROCEDURE:  Rectal inspection was pertinent for external  hemorrhoids, small. Digital exam was negative. The video colonoscope was  inserted, easily advanced around the colon to the cecum. This did not  require any abdominal pressure or any position changes. Other than left  greater than rare right diverticula, no abnormalities were seen on  insertion. The scope was advanced a short ways into the terminal ileum which  was normal. Photo documentation was obtained. The scope was slowly  withdrawn. On slow withdrawal through the colon, the prep was adequate.  There was some liquid stool that required washing and suctioning. Other than  the left sided moderate greater than rare right sided diverticula, there was  a minimal amount of left sided inflammation around some of the diverticula  but no polyps, tumors, masses, or other abnormalities were seen as we slowly  withdrew back to the rectum. Anal rectal pullthrough and retroflexion  confirmed some small hemorrhoids. The scope was straightened and readvanced  a short ways up the left side of the colon, air was suctioned, scope  removed. The patient tolerated the procedure well. There was no obvious or  immediate complications.   ENDOSCOPIC DIAGNOSIS:  1.  Internal and external hemorrhoids.  2.  Left greater  than rare right diverticula.  3.  Otherwise within normal limits to the cecum and terminal ileum.   PLAN:  Recheck colon screening in 5-10 years. Happy to see back p.r.n.,  return care to Dr. Harrington Challenger for the customary health care screening and  maintenance.           ______________________________  Jeryl Columbia, M.D.     MEM/MEDQ  D:  04/15/2005  T:  04/15/2005  Job:  [id number]   cc:   C. Melinda Crutch, M.D.  [address]  [place]  [place] [address]  Fax: [phone]

## 2011-01-09 NOTE — Discharge Summary (Signed)
NAME:  DAMARCO, OVERCASH NO.:  [id number]   MEDICAL RECORD NO.:  [id number]                   PATIENT TYPE:  REC   LOCATION:  OREH                                 FACILITY:  [facility]   PHYSICIAN:  Gaynelle Arabian, M.D.                 DATE OF BIRTH:  [birth year]   DATE OF ADMISSION:  08/28/2002  DATE OF DISCHARGE:  08/31/2002                                 DISCHARGE SUMMARY   ADMISSION DIAGNOSES:  1. Medial compartment arthritis right knee.  2. Myocardial infarction.  3. Coronary arterial disease.   DISCHARGE DIAGNOSES:  1. Right knee medial compartment arthritis, status post right knee     unicompartmental replacement arthroplasty.  2. Myocardial infarction.  3. Coronary arterial disease.   PROCEDURE:  The patient was taken to the OR on August 28, 2002, and  underwent a right knee unicompartmental replacement arthroplasty.  Surgeon:  Dr. Pilar Plate Aluisio.  Assistant:  Arlee Muslim, P.A.C.  Surgery under  spinal/epidural.  Estimated blood loss minimal.  Hemovac drain x 1.  Tourniquet time 64 minutes at 300 mmHg.   CONSULTATIONS:  None.   BRIEF HISTORY:  The patient is a 65 year old male who has been seen by Dr.  Wynelle Link for right knee pain.  He was found to have medial compartment  arthritis.  Had undergone injections in the past with only temporary relief.  The arthritis has been refractory to conservative measures.  He is felt to  be an excellent candidate for a unicompartmental knee replacement due to his  isolated arthritis.  The risks and benefits were discussed, and the patient  was subsequently admitted to the hospital.   LABORATORY DATA:  CBC on admission:  Hemoglobin of 14.4, hematocrit of 40.5,  white cell count 6.6, red cell count 4.53.  Postoperative H&H 12.6 and 35.7.  Last noted H&H 12.9 and 36.8.  The differential on the admission CBC all  within normal limits.  PT and PTT on admission were 13.3 and 32  respectively, with an INR of 1.   Serial protimes followed per Coumadin  protocol.  Last noted PT and INR 16.7 and 1.4.  Chemistry panel on  admission:  Elevated glucose of 175.  Remaining chemistry panel all within  normal limits.  Follow-up BMET showed a slight drop in sodium from 138 to  134, glucose again was noted at 171.  Urinalysis on admission was negative.  Blood group type O positive.   Chest x-ray dated August 21, 2002:  Stable mild cardiomegaly.  No active  disease.   HOSPITAL COURSE:  The patient was admitted to [facility] and taken  to the OR, underwent the above-stated procedure without complication.  The  patient tolerated the procedure well and was later transferred to the  recovery room.  He was placed on an epidural catheter for postoperative pain  control.  The epidural was removed on postoperative  day #1.  He was switched  over to p.o. analgesics.  The patient did very well with his pain control  initially.  PT and OT were consulted to assist with gait training and  ambulation.  Dressing changes initiated on postoperative day #2.  Hemovac  drain placed at the time of surgery was pulled postoperatively without  difficulty.  Hemoglobin remained stable.  He did very well with physical  therapy, up ambulating approximately 120 feet on postoperative day #1 and to  200 feet by postoperative day #2.  Dressing changes initiated on  postoperative day #2.  Incision was healing well.  By day #3 he was doing  quite well, tolerating p.o. medications, and was discharged home.   DISCHARGE MEDICATIONS AND PLAN:  1. The patient was discharged home on August 31, 2002.  2. Discharge diagnoses:  Please see above.  3. Discharge medications:  Vicodin for pain.  Robaxin for spasm.  Coumadin     as per pharmacy protocol.  4. Diet:  Cardiac diet.  5. Follow-up:  He is to follow up the next Tuesday in the office following     discharge.  Call the office for an appointment.  6. Activity:  Weightbearing as  tolerated.  Home health PT, home health     nursing through [facility].  __________.   DISPOSITION:  Home.   CONDITION ON DISCHARGE:  Improved.     Alexzandrew L. Dara Lords, P.A.              Gaynelle Arabian, M.D.    ALP/MEDQ  D:  09/27/2002  T:  09/27/2002  Job:  [id number]

## 2011-01-09 NOTE — Op Note (Signed)
. [facility]  Patient:    [name], BAGOT Visit Number: [id number] MRN: [id number]          Service Type: DSU Location: [facility] Attending Physician:  Pryor Curia Dictated by:   Geroge Baseman Alphonzo Cruise, M.D. Proc. Date: 04/19/01 Adm. Date:  04/19/2001                             Operative Report  PREOPERATIVE DIAGNOSIS:  Previous partial medial meniscectomy, right knee, with possible new internal derangement.  POSTOPERATIVE DIAGNOSES: 1. Partial previous medial meniscectomy, right knee, with new tear in remnant    of medial meniscus. 2. Early osteoarthritis, weightbearing area medial femoral condyle and femoral    notch area, right knee, with grade 2 and grade 3 cartilage damage.  PROCEDURES: 1. Arthroscopy. 2. Chondroplasty of femoral notch and medial femoral condyle. 3. Partial medial meniscectomy, right knee.  SURGEON:  Geroge Baseman. Alphonzo Cruise, M.D.  ANESTHESIA:  MAC.  PATHOLOGY:  With the arthroscope in the knee, a very careful examination of both compartments undertaken.  The lateral compartment was visualized first. The articular cartilage over the lateral femoral condyle and lateral tibial plateau and the entire circumference of the lateral meniscus was absolutely normal.  Anterior cruciate ligament was visualized, and this was normal.  In the medial compartment, patient had a previous partial medial meniscectomy. At in the midthird of the medial meniscus where it had been partially debrided, there was a new tear.  There was also grade 2 and some grade 3 cartilage damage over the weightbearing area of the medial femoral condyle. The articular cartilage over the medial tibial plateau appeared fairly normal. With the arthroscope in the femoral notch area, there was grade 2 and grade 3 cartilage damage, and this was in the trochlear area.  DESCRIPTION OF PROCEDURE:  The patient was placed on the operating table in a supine position  with the pneumatic tourniquet about the right upper thigh. The right leg was placed in the leg holder and the entire right lower extremity prepped with Duraprep and draped out in the usual manner.  Marcaine had been placed in the knee and Xylocaine and epinephrine used to infiltrate the puncture wounds.  An infusion cannula was placed in the superior medial pouch.  Anteromedial and anterolateral portals were made.  The arthroscope was introduced, and the findings in the lateral compartment were noted as above. Attention was then turned to the medial compartment.  Through both medial and lateral portals, the tear in the medial meniscus was debrided once again. This was a fairly small tear and was in the midthird of the meniscus, where there had been some previous surgery.  There was also grade 2 and grade 3 cartilage fraying of the articular cartilage of the weightbearing area of the medial femoral condyle, and the chondroplastic shaver was used to smooth this down.  The arthroscope was then passed through the femoral notch area, and there was also fraying and tearing of articular cartilage in the trochlear area, and this was debrided with the chondroplastic shaver.  Marcaine was then placed in the knee and a large bulky pressure dressing was applied.  The patient then returned to the recovery room in excellent condition. Technically this procedure went extremely well.  COMPLICATIONS:  None.  FOLLOW-UP CARE: 1. The patient will stay overnight because he has sleep apnea.  He is to stay    for observation. 2. Discharge in  the a.m. by anesthesia. [place] for pain on discharge. 4. Return to my office next week for follow-up exam. Dictated by:   Geroge Baseman. Alphonzo Cruise, M.D. Attending Physician:  Pryor Curia DD:  04/19/01 TD:  04/20/01 Job: [id number]

## 2011-01-09 NOTE — H&P (Signed)
NAME:  [name], [name] NO.:  [id number]   MEDICAL RECORD NO.:  [id number]                   PATIENT TYPE:  INP   LOCATION:  0468                                 FACILITY:  [facility]   PHYSICIAN:  Gaynelle Arabian, M.D.                 DATE OF BIRTH:  July 21, 1946   DATE OF ADMISSION:  08/28/2002  DATE OF DISCHARGE:                                HISTORY & PHYSICAL   CHIEF COMPLAINT:  Right knee pain.   HISTORY OF PRESENT ILLNESS:  The patient is a 65 year old male who has been  seen and evaluated by Gaynelle Arabian, M.D. for right knee pain.  He was found  to have right knee medial compartment arthritis.  He has been treated  conservatively in the past for his arthritis.  He has undergone cortisone  injections which only provided limited relief.  He has also undergone a  trial of Synvisc injections which, again, only provided temporary relief.  He was found in the office to have medial compartment arthritis which has  been refractory to conservative treatment.  It is felt he is an excellent  candidate for unicompartmental knee replacement due to the isolated area of  his arthritis.  The knee pain has been progressive and has reached a point  where the patient would like to have something done about it.  Risks and  benefits of this procedure have been discussed with the patient along with  the possibility that at the time of surgery if he is found to have a more  advanced arthritis, there is a possibility of a total knee replacement.  The  patient has undergone discussion with these risks and benefits and has  elected to proceed with surgery.  The patient is subsequently admitted to  [place]:  No known drug allergies.  Intolerances:  POTASSIUM and CODEINE  cause a tingling or paraesthesia type symptom.   CURRENT MEDICATIONS:  1. Aspirin 81 mg daily stopped prior to surgery.  2. Lisinopril/hydrochlorothiazide 20/12.5 mg b.i.d.  3.  Toprol XL 25 mg daily.  4. Simvastatin 80 mg one-half tablet daily.  5. Naproxen 500 mg daily stopped prior to surgery.  6. Sonata 10 mg p.r.n.   PAST MEDICAL HISTORY:  1. Myocardial infarction.  2. Coronary arterial disease.   PAST SURGICAL HISTORY:  1. Knee arthroscopy x2.  2. Coronary artery bypass grafting 1995 of three vessels.  3. Circumcision.  4. He has also undergone cardiac catheterization with stenting.   SOCIAL HISTORY:  He is married and has one son.  Denies use of tobacco  products.  Only very seldom intake of alcohol.  He is a job Chief Strategy Officer.   FAMILY HISTORY:  Sister with open heart surgery and stroke.  Mother living  age 58 with history of joint problems.   REVIEW OF SYSTEMS:  GENERAL:  No fevers, chills, night sweats.  NEUROLOGIC:  No seizures, syncope, paralysis.  RESPIRATORY:  No shortness of breath,  productive cough, or hemoptysis.  CARDIOVASCULAR:  No chest pain, angina,  orthopnea.  GASTROINTESTINAL:  No nausea, vomiting, diarrhea, constipation.  GENITOURINARY:  No dysuria, hematuria, or discharge.  MUSCULOSKELETAL:  Pertinent to that of the right knee found in history of present illness.   PHYSICAL EXAMINATION:  VITAL SIGNS:  Pulse 60, respirations 12, blood  pressure 120/70.  GENERAL:  The patient is a 65 year old white male well-nourished, well-  developed, slightly overweight.  No acute distress.  He is alert, oriented,  cooperative, very pleasant at time of examination.  HEENT:  Normocephalic, atraumatic.  Pupils round, reactive.  Noted to wear  glasses.  Oropharynx is clear.  NECK:  Supple.  CHEST:  He is a large barrel chested individual.  Breath sounds are clear to  anterior/posterior chest walls.  No rhonchi, rales, or wheezing.  HEART:  Regular rate and rhythm.  No murmur.  S1, S2 noted.  ABDOMEN:  Soft, nontender.  Bowel sounds are present.  Abdomen is round on  examination.  RECTAL:  Not done.  Not pertinent to present illness.  BREASTS:   Not done.  Not pertinent to present illness.  GENITALIA:  Not done.  Not pertinent to present illness.  EXTREMITIES:  Right knee:  He has range of motion of 0-125 degrees.  He is  noted to have a moderate amount of patellofemoral crepitus on passive range  of motion.  Knee appears stable.  There is no signs of infection.  No  obvious effusion.  He does have some tenderness noted medial along the  patellofemoral region down toward the medial joint line.   IMPRESSION:  1. Medial compartment arthritis right knee.  2. Myocardial infarction.  3. Coronary arterial disease.   PLAN:  The patient will be admitted to [facility] to undergo a  right knee unicompartmental replacement versus a possible right total knee  replacement.  Surgery was performed by Gaynelle Arabian, M.D.  Dr. Olevia Perches is  the patient's medical doctor and has cleared the patient for the up and  coming surgery.  Dr. Olevia Perches will be notified of the room number on admission  and will be consulted if needed for any medical assistance with this patient  throughout the hospital course.     Alexzandrew L. Dara Lords, P.A.              Gaynelle Arabian, M.D.    ALP/MEDQ  D:  08/29/2002  T:  08/29/2002  Job:  [id number]   cc:   Olevia Perches, M.D.   Gaynelle Arabian, M.D.  [address]  [place]  [place] [address]  Fax: [phone]

## 2011-01-09 NOTE — Consult Note (Signed)
NAME:  [name], [name] NO.:  [id number]   MEDICAL RECORD NO.:  [id number]          PATIENT TYPE:  EMS   LOCATION:  ED                           FACILITY:  [facility]   PHYSICIAN:  Benito Mccreedy, M.D.DATE OF BIRTH:  1946/05/07   DATE OF CONSULTATION:  08/30/2005  DATE OF DISCHARGE:  08/30/2005                                   CONSULTATION   REFERRING PHYSICIAN:  Tiffany Kocher L. Eulis Foster, M.D.   REASON FOR CONSULTATION:  Evaluation to decide on possible admission for  abdominal pain.   HISTORY OF PRESENT ILLNESS:  The patient is 65 year old Caucasian gentleman  with history of diverticular disease who presented with abdominal pain and 3  episodes of diarrhea which had remitted at the time of evaluation in the ED.  Apparently he came to the ED with abdominal pain which is mainly in the  subumbilical area, described as crampy in characteristic with an aching and  a dull component.  He has felt nauseated and has vomited once with decreased  appetite.  The patient was 66/10 at the time of evaluation in the ED.  The  patient had also ultrasound of the abdomen which a suboptimal study because  of his body habitus without any stones.  He was also mildly febrile with a  temperature of a 100.6 in the ED.  Lab tests reviewed and no leukocytosis.  CT scan of the abdomen and pelvis was ordered for evaluation by the  hospitalist service was requested.  At the time of my evaluation, the  patient's pain had resolved and pain score was 0/10.  He was feeling better,  has not had any nausea or vomiting.  He has not had any more diarrhea at the  emergency room.  The patient was feeling fine and was actually wanting to go  home.  I reviewed the patient's CT scan and discussed with Dr. Thornton Papas of  Radiology, and it showed sigmoid diverticulosis without any diverticulitis.  There was a small amount of free fluid at the base of the right pericolic  ductal and upper right pelvis.  There was a  questionable bowel wall segment  and minimal surrounding pericolic inflammatory changes at the cecum and  proximal ascending colon which was nonspecific with normal appendix.   The patient denied any chest pain, shortness of breath, palpitations, PND,  orthopnea.   PAST MEDICAL HISTORY:  1.  History of coronary artery disease, status post CABG.  2.  History of partial right knee placement.  3.  History of diabetes mellitus.  4.  Depression.   MEDICATIONS:  Hydrochlorothiazide, lisinopril, Avandia, Effexor,  gemfibrozil, metoprolol, ranitidine, and Zetia.  He also takes atorvastatin  for dyslipidemia.   PHYSICAL EXAMINATION:  GENERAL:  The patient was comfortable, not in  distress.  He was well looking.  HEENT:  Normocephalic, atraumatic.  Pupils equal, round and reactive to  light.  Extraocular movements intact.  Oropharynx moist.  NECK:  Supple.  No JVD.  CARDIAC:  The patient did not have any tachycardia.  Rhythm was regular.  There was no gallop.  ABDOMEN:  Obese, soft, bowel sounds present. No obvious tenderness though  the patient had had diffuse tenderness in __________ area according to ED  physician's notes.  EXTREMITIES:  No cyanosis.  CNS:  Exam was nonfocal.   LABORATORY DATA:  Ultrasound of the abdomen and CT scan of the abdomen with  findings as noted above.  The patient's WBC count was 8.9, hemoglobin 11.9,  hematocrit 34.2, MCV 2.3, platelet count 203.  Urinalysis negative for  urinary infection.  Sodium 136, potassium 3.7, chloride 104, CO2 25, glucose  98, BUN 21, creatinine 1.5, calcium 8.9.  Total protein __________, albumin  3.7, AST 14, ALT 12, alkaline phosphatase 49, total bilirubin 0.9, lipase  28.   IMPRESSION:  1.  Abdominal pain, resolved.  2.  Colitis from a clinical evaluation, as well as radiologic review.  The      patient does not have any leukocytosis.  His fever (earlier) has      spontaneously improved.   I offered to admit this gentleman  for 23-hour observation but patient  indicated that he is feeling fine now and would prefer to go home and  therefore declined observation status admission.  The patient will be  discharged home.  He has Cipro and Flagyl at home that he normally takes  when he has a flareup of his diverticula  every 2 hours. Would recommend  that patient start Flagyl 500 mg p.o. t.i.d. and Cipro 500 mg p.o. b.i.d.  for about 7 days.  Follow up with his PCP, Dr. Si Raider, sometime next  week.  The patient would obviously need a followup CT scan of the abdomen to  follow up on the bowel wall segment.  The patient will discuss with ED  physician, Dr. Christ Kick.      Benito Mccreedy, M.D.  Electronically Signed     GO/MEDQ  D:  08/30/2005  T:  08/30/2005  Job:  [id number]

## 2011-01-09 NOTE — Cardiovascular Report (Signed)
[place]. [facility]  Patient:    [name]                        MRN: [id number] Proc. Date: 09/12/99 Adm. Date:  [id number] Attending:  Fatima Sanger CC:         Sherren Kerns. Pamella Pert, M.D.             Bruce Alfonso Patten Olevia Perches, M.D. [facility]             Cardiac Catheterization Laboratory                        Cardiac Catheterization  CLINICAL HISTORY:  Mr. Schearer is 65 years old and had bypass surgery several years ago.  He also had stenting of a diagonal branch of the LAD in 1999.  He works in [employer] and has had a great deal of dyspnea on exertion and has had difficulty doing his work.  He in an Chief Executive Officer.  He also has sleep apnea and had back problems which also limit his work capacity.  Because of uncertainty regarding whether his symptoms might be related to his heart, we brought him in for evaluation with catheterization.  DESCRIPTION OF PROCEDURE:   The procedure was performed via the right femoral artery using an arterial sheath and 6-French preformed coronary catheters.  A front wall arterial puncture was performed and Omnipaque contrast was used.  We closed the right femoral artery with Perclose at the end of the procedure.  The grafts  were injected with a JR-4 diagnostic right catheter.  The patient tolerated the  procedure well and left the laboratory in satisfactory condition.  RESULTS:  PRESSURES:  The aortic pressure was 133/85, with a mean of 103.  The left ventricular pressure was 133/23.  CORONARY ANGIOGRAPHY: 1. The left main coronary artery is free of significant disease.  2. The left anterior descending artery gave rise to a large diagonal branch.    The LAD itself was short and terminated before the apex.  There was 50%    narrowing in the LAD after the diagonal branch.  There was 40% narrowing in    the mid diagonal branch after a subbranch at the previous stent site.  3. The circumflex  artery gave rise to a marginal branch, an atrial branch, and    then was completely occluded.  4. The right coronary artery was completely occluded proximally.  SAPHENOUS VEIN GRAFTS: 1. The saphenous vein graft to the posterior descending artery was patent and    functioned normally.  The graft filled both the posterior descending artery    and a large posterolateral branch.  2. The saphenous vein graft to the marginal branch of the circumflex artery    was patent and functioned normally.  LEFT VENTRICULOGRAM:  Left ventriculogram was performed in the RAO projection and showed good wall motion with no areas of hypokinesis.  The estimated ejection fraction was 60%.  CONCLUSIONS:  Coronary artery disease, status post prior coronary artery bypass  graft surgery and status post prior stenting of a diagonal branch of the left anterior descending artery in 1999.  The native circulation shows 50% narrowing in the proximal left anterior descending artery, 40% narrowing at the stent site in the diagonal branch, and total occlusion of the circumflex and right coronary arteries.  The vein grafts to the posterior descending branch of the right coronary artery  and marginal branch of the circumflex artery are patent and functioning normally.  The left ventricular function is normal.  RECOMMENDATIONS:  The patient has no source of ischemia and good left ventricular function.  I think his symptoms of dyspnea are not related to his cardiac problems. We will plan reassurance and further rehabilitation. DD:  09/12/99 TD:  09/12/99 Job: [id number]

## 2011-01-09 NOTE — Op Note (Signed)
[place]. [facility]  Patient:    [name], [name]                       MRN: [id number] Proc. Date: 12/07/00 Adm. Date:  [id number] Disc. Date: [id number] Attending:  Pryor Curia                           Operative Report  PREOPERATIVE DIAGNOSIS:  Tear of posterior horn medial meniscus right knee.  POSTOPERATIVE DIAGNOSIS:  Complex tear posterior horn medial meniscus right knee with some cartilage damage in weightbearing area of medial femoral condyle secondary to torn posterior horn.  OPERATION:  Arthroscopy; debris of torn posterior horn medial meniscus right knee; chondroplasty of medial femoral condyle right knee.  SURGEON:  Geroge Baseman. Alphonzo Cruise, M.D.  ASSISTANT:  ANESTHESIA:  Spinal  PATHOLOGY:  We arthroscoped the knee, very care examination of both compartments.  Lateral compartment was visualized first.  Articular cartilage of the lateral femoral condyle, lateral tibial plateau, and entire circumference of the lateral meniscus was absolutely normal.  The anterior cruciate ligament was visualized and this was normal. The arthroscope was then placed in the medial compartment. There was a complex tear of the posterior horn medial meniscus and there was some scuffing and tearing of the articular cartilage over posterior aspect of the medial femoral condyle adjacent to the torn posterior horn.  It appears as though the torn posterior horn caused some damage to the medial femoral condyle.  The arthroscope was then passed in the patellofemoral notch and this appeared normal.  DESCRIPTION OF PROCEDURE:  The patient placed on the operating table in the supine position with pneumatic tourniquet above the right upper thigh.  Right leg placed in leg holder and the entire right lower extremity prepped with DuraPrep and draped out in the usual manner.  An infusion ________________ knee was distended with saline.  After admitting anterior lateral  portal and a midline patella portal was made. The arthroscope was introduced.  The findings were as described above.  Pathology was seen in the medial compartment. Through all three anterior portals a series of baskets were inserted and the posterior horn medial meniscus was very aggressively debrided. This was followed up with the anterior articular shaver and debris was removed. The rim was smooth and balanced and nice transition of the mid third of the medial meniscus. There was also some grade II cartilage damage to the posterior aspect of the medial femoral condyle and this was debrided with a chondroplasty shaver.  No other significant pathology was seen in the knee.  A large bulky sterile dressing was applied and the patient returned to the recovery room in excellent condition.  _________________ extremely well.  DRAINS:  None.  COMPLICATIONS:  None. DD:  12/07/00 TD:  12/08/00 Job: [id number]

## 2011-01-09 NOTE — Op Note (Signed)
NAME:  [name], [name] NO.:  [id number]   MEDICAL RECORD NO.:  [id number]                   PATIENT TYPE:  INP   LOCATION:  X003                                 FACILITY:  [facility]   PHYSICIAN:  Gaynelle Arabian, M.D.                 DATE OF BIRTH:  19-Jan-1946   DATE OF PROCEDURE:  08/28/2002  DATE OF DISCHARGE:                                 OPERATIVE REPORT   PREOPERATIVE DIAGNOSIS:  Medial compartment arthritis, right knee.   POSTOPERATIVE DIAGNOSIS:  Medial compartment arthritis, right knee.   OPERATION PERFORMED:  Right knee ED compartment replacement.   SURGEON:  Gaynelle Arabian, M.D.   ASSISTANT:  Alexzandrew L. Dara Lords, P.A.   ANESTHESIA:  Spinal/epidural.   ESTIMATED BLOOD LOSS:  Minimal.   DRAINS:  Hemovac times one.   COMPLICATIONS:  None.   TOURNIQUET TIME:  64 minutes at 300 mmHg.   CONDITION ON DISCHARGE:  Stable to recovery.   INDICATIONS FOR PROCEDURE:  The patient is a 65 year old male with  significant right knee pain and end stage arthritis of the medial  compartment.  The patellofemoral and lateral compartments look fine on x-  ray.  He has failed nonoperative management including injections and  presents now for unicompartmental versus total knee arthroplasty.   DESCRIPTION OF PROCEDURE:  After successful administration of  spinal/epidural anesthetic, a tourniquet was placed high on his right thigh  and right lower extremity prepped and draped in the usual sterile fashion.  Extremity was wrapped in an Esmarch, knee flexed, tourniquet inflated to 300  mmHg.  We made the mini incision from the superior aspect of the patella  medially, forcing distally to just medial to the tibial tubercle.  The skin  was cut with a 10 blade through subcutaneous tissue to the level of the  extensor mechanism.  Medial arthrotomy was made with a fresh blade.  Soft  tissue over the proximal medial tibia was elevated into the joint line with  a  knife and the semimembranosus exposed with a curved osteotome.  Inspection  of the patellofemoral joint and the undersurface of the patella shows no  evidence of any significant wear.  He had some slight chondromalacia in the  trochlea but no full thickness defects.  The lateral compartment looks  completely normal.  The extramedullary tibial alignment guide was then  placed referencing proximally to the medial aspect of the tibial tubercle  and distally along the second metatarsal axis and tibial crest.  The  blocking pins were moved  4 mm off the medial side.  The tibial resection was made with an oscillating  saw through the majority of it and then a sagittal saw to create the border  in the intercondylar region.  The bone wafer was removed and then the knee  held in full extension and the distal femoral block placed.  This was pinned  and  the distal femoral resection made with an oscillating saw.  The sizing  block was then placed on the femur and size 4 was most appropriate.  We  marked the holes for the cutting block and then did the gouging anteriorly  to mark the superior aspect of the anterior chamfer.  The cutting block was  then placed and we did the posterior chamfer, posterior condylar and  anterior chamfer cuts off the block.  Sizing block again placed.  Size 4 was  most appropriate.  We centered in the femoral condyle and then with the  center drill and center keel punch.  The tibia was again addressed and a  size 3 was the most appropriate tibia.  We effectively pinned the tibial  block and then with a bur created the groove for the tibial keel.  Once this  was completed, we did the punch through the block and removed that cutting  block and then got the trial 3 x 7 mm tibial component and permanent size 4  femoral component and placed those on the tibia and femur respectively.  The  knee had full extension with excellent balance throughout and the component  sat in  excellent position without lifting off.  We then removed those  components, washed the components and thoroughly irrigated the joint and cut  bone surfaces with pulsatile lavage.  Cement was mixed and once ready for  implantation, the size 3 7 mm thick tibial components cemented into place  and size 4 femoral components cemented into place.  Knee was held in full  extension and all extruded cement removed.  Once the cement was fully  hardened, the knee was placed through a range of motion and components were  found to be stable throughout.  The wound was further irrigated and the  arthrotomy closed over a Hemovac drain with interrupted #1 PDS.  The  tourniquet was released.  Total time of 64 minutes.  Subcu was closed with  interrupted 2-0 Vicryl, subcuticular running 4-0 Monocryl.  Incision was  clean and dry and Steri-Strips and bulky sterile dressing applied.  Drain is  hooked to suction.  Knee was placed into a knee immobilizer.  Awakened and  transported to recovery in stable condition.                                               Gaynelle Arabian, M.D.    FA/MEDQ  D:  08/28/2002  T:  08/28/2002  Job:  [id number]

## 2011-04-13 ENCOUNTER — Ambulatory Visit (INDEPENDENT_AMBULATORY_CARE_PROVIDER_SITE_OTHER): Payer: Medicare Other | Admitting: Internal Medicine

## 2011-04-13 ENCOUNTER — Encounter: Payer: Self-pay | Admitting: Internal Medicine

## 2011-04-13 VITALS — BP 124/80 | HR 96 | Ht 70.5 in | Wt 222.8 lb

## 2011-04-13 DIAGNOSIS — G4730 Sleep apnea, unspecified: Secondary | ICD-10-CM

## 2011-04-13 DIAGNOSIS — J309 Allergic rhinitis, unspecified: Secondary | ICD-10-CM

## 2011-04-13 NOTE — Assessment & Plan Note (Signed)
He reports recent failure of antihistamines and nasal sprays with subsequent response to a prednisone burst. We began a preliminary consideration of the impact of CPAP and filtered air on his allergic rhinitis.

## 2011-04-13 NOTE — Assessment & Plan Note (Addendum)
Severe OSA. Succdessfully compliant with CPAP. We will autotitrate for pressure reconsideration and replace mask.  We have reviewed the medical concerns and available treatments for obstructive sleep apnea.

## 2011-04-13 NOTE — Progress Notes (Signed)
Subjective:    Patient ID: [name], male    DOB: [birth year], 65 y.o.   MRN: [id number]  HPI 04/13/11- 36yoM with OSA/ CPAP, complicated by CAD, DM, HBP  PCP Dr CA Ross/ [facility] Last here 2009- [facility] wanted him to update contact. He has been succesfully using CPAP all night every night for years. Now feels that he sucks in water from his machine. We discussed masks and settings. Current machine works normally and is using nasal pillows mask NPSG 02/08/00 severe OSA  AHI 116/hr. Weighed 250 lbs. Was using CPAP 7. Weight got up over 300 lbs and he had lap-band surgery with successful weight loss. He thinks current pressure might be 10. No ENT surgery. History of MI, CABG 1995, stent in 1996. Bedtime between 11 PM and 2 AM short sleep latency before waking between 4:30 and 5:30 AM. Review of Systems Constitutional:   No- recent  weight loss, no- night sweats, fevers, chills, fatigue, lassitude. HEENT:   No-  headaches, difficulty swallowing, tooth/dental problems, sore throat,       No-current  sneezing, itching, ear ache, nasal congestion, post nasal drip; he was treated with prednisone for exacerbation of allergic rhinitis with cough. CV:  No-   chest pain, orthopnea, PND, swelling in lower extremities, anasarca, dizziness, palpitations Resp: No-   shortness of breath with exertion or at rest.              No-   productive cough,  No non-productive cough,  No-  coughing up of blood.              No-   change in color of mucus.  No- wheezing.   Skin: No-   rash or lesions. GI:  No-   heartburn, indigestion, abdominal pain, nausea, vomiting, diarrhea,                 change in bowel habits, loss of appetite GU: No-   dysuria, change in color of urine, no urgency or frequency.  No- flank pain. MS:  No-   joint pain or swelling.  No- decreased range of motion.  No- back pain. Neuro- grossly normal to observation, Or:  Psych:  No- change in mood or affect. No depression or anxiety.  No memory  loss.      Objective:   Physical Exam General- Alert, Oriented, Affect-appropriate, Distress- none acute; obese Skin- rash-none, lesions- none, excoriation- none Lymphadenopathy- none Head- atraumatic            Eyes- Gross vision intact, PERRLA, conjunctivae clear secretions            Ears- Hearing, canals normal            Nose- Clear, no-Septal dev, mucus, polyps, erosion, perforation             Throat- Mallampati IV , mucosa clear , drainage- none, tonsils- atrophic Neck- flexible , trachea midline, no stridor , thyroid nl, carotid no bruit Chest - symmetrical excursion , unlabored           Heart/CV- RRR , no murmur , no gallop  , no rub, nl s1 s2                           - JVD- none , edema- none, stasis changes- none, varices- none           Lung- clear to P&A, wheeze- none, cough- none , dullness-none, rub-  none           Chest wall-  Abd- tender-no, distended-no, bowel sounds-present, HSM- no Br/ Gen/ Rectal- Not done, not indicated Extrem- cyanosis- none, clubbing, none, atrophy- none, strength- nl Neuro- grossly intact to observation         Assessment & Plan:

## 2011-04-13 NOTE — Patient Instructions (Signed)
Order- [facility]- replacement CPAP mask of choice and supplies            -  Autotitrate x 2 weeks for pressure recommendation    5-20 cwp

## 2011-04-21 ENCOUNTER — Encounter: Payer: Self-pay | Admitting: Internal Medicine

## 2011-05-12 ENCOUNTER — Encounter: Payer: Self-pay | Admitting: Internal Medicine

## 2011-05-12 ENCOUNTER — Ambulatory Visit (INDEPENDENT_AMBULATORY_CARE_PROVIDER_SITE_OTHER): Payer: Medicare Other | Admitting: Internal Medicine

## 2011-05-12 VITALS — BP 118/76 | HR 59 | Ht 70.5 in | Wt 228.6 lb

## 2011-05-12 DIAGNOSIS — G4733 Obstructive sleep apnea (adult) (pediatric): Secondary | ICD-10-CM

## 2011-05-12 DIAGNOSIS — G4730 Sleep apnea, unspecified: Secondary | ICD-10-CM

## 2011-05-12 DIAGNOSIS — J309 Allergic rhinitis, unspecified: Secondary | ICD-10-CM

## 2011-05-12 MED ORDER — IPRATROPIUM BROMIDE 0.03 % NA SOLN: 2.0000 | Freq: Four times a day (QID) | NASAL | Status: DC

## 2011-05-12 NOTE — Patient Instructions (Signed)
Sample Patanase nasal antihistamine spray      1-2 puffs each nostril up to twice daily if needed  Script to try ipratropium nasal spray as an alternative   Order - [place]- [facility]- change CPAP to fixed 13   [facility] as needed  [facility]al [facility]out, [facility]odont[facility], for consideration of Oral appliance for snoring and sleep apnea. (staff- hold records till completed)

## 2011-05-12 NOTE — Progress Notes (Signed)
Subjective:    Patient ID: [name], male    DOB: 01-Apr-1946, 65 y.o.   MRN: [id number]  HPI 05/12/2011-18 year old never smoker followed for obstructive sleep apnea/CPAP, complicated by, allergic rhinitis, CAD/MI/CABG, carotid stenosis/TIA, DM, HBP............... PCP is Dr. Jonna Coup Last here 04/13/2011-note reviewed. We have sought evaluation of CPAP pressure with auto titration and download report is pending. He turned his data in today. Using a nasal pillows mask, he has had some sinus congestion. This has been aggravated by, and inhalant triggers but also by nonspecific vasomotor triggers such as food and sex. He has tried prednisone, Allegra, Flonase. Decongestant nasal spray helps but burns his nose. Nasal congestion affects his sleep. Hot humid weather makes him feel weak and short of breath in a nonspecific way. We discussed treatment choices and considered ipratropium nasal spray, noting that he denies history of glaucoma. He asks about alternatives to CPAP and is particularly interested in oral appliances. Review of Systems Constitutional:   No-   weight loss, night sweats, fevers, chills, fatigue, lassitude. HEENT:   No-  headaches, difficulty swallowing, tooth/dental problems, sore throat,       No-  sneezing, itching, ear ache,            +nasal congestion, post nasal drip,  CV:  No-   chest pain, orthopnea, PND, swelling in lower extremities, anasarca, dizziness, palpitations Resp: No-   shortness of breath with exertion or at rest.              No-   productive cough,  No non-productive cough,  No-  coughing up of blood.              No-   change in color of mucus.  No- wheezing.   Skin: No-   rash or lesions. GI:  No-   heartburn, indigestion, abdominal pain, nausea, vomiting, diarrhea,                 change in bowel habits, loss of appetite GU: No-   dysuria, change in color of urine, no urgency or frequency.  No- flank pain. MS:  No-   joint pain or swelling.  No-  decreased range of motion.  No- back pain. Neuro- grossly normal to observation, Or:  Psych:  No- change in mood or affect. No depression or anxiety.  No memory loss.      Objective:   Physical Exam General- Alert, Oriented, Affect-appropriate, Distress- none acute, overweight but not morbidly obese. Skin- rash-none, lesions- none, excoriation- none Lymphadenopathy- none Head- atraumatic            Eyes- Gross vision intact, PERRLA, conjunctivae clear secretions            Ears- Hearing, canals-normal            Nose- Clear, no-Septal dev, mucus, polyps, erosion, perforation. Turbinates are not markedly edematous now.            Throat- Mallampati IV , mucosa clear , drainage- none, tonsils- atrophic Neck- flexible , trachea midline, no stridor , thyroid nl, carotid no bruit Chest - symmetrical excursion , unlabored           Heart/CV- RRR , no murmur , no gallop  , no rub, nl s1 s2                           - JVD- none , edema- none, stasis changes- none, varices- none  Lung- clear to P&A, wheeze- none, cough- none , dullness-none, rub- none           Chest wall-  Abd- tender-no, distended-no, bowel sounds-present, HSM- no Br/ Gen/ Rectal- Not done, not indicated Extrem- cyanosis- none, clubbing, none, atrophy- none, strength- nl Neuro- grossly intact to observation         Assessment & Plan:

## 2011-05-14 NOTE — Assessment & Plan Note (Signed)
Treatment choices reviewed. We're going to give sample Patenase nasal antihistamine for trial on a when necessary basis.

## 2011-05-14 NOTE — Assessment & Plan Note (Signed)
We are waiting for the results of the download auto titration report turned in today. He can live with the CPAP and has demonstrated good control in the past. However he is very much interested in oral appliance is as an alternative, and found  CPAP somewhat annoying. We have reviewed, and alternatives including Provent and surgery.

## 2011-06-23 ENCOUNTER — Other Ambulatory Visit: Payer: Self-pay | Admitting: Dermatology

## 2011-06-29 ENCOUNTER — Encounter: Payer: Self-pay | Admitting: Cardiology

## 2011-06-29 ENCOUNTER — Ambulatory Visit (INDEPENDENT_AMBULATORY_CARE_PROVIDER_SITE_OTHER): Payer: Medicare Other | Admitting: Cardiology

## 2011-06-29 DIAGNOSIS — Z01818 Encounter for other preprocedural examination: Secondary | ICD-10-CM

## 2011-06-29 DIAGNOSIS — E785 Hyperlipidemia, unspecified: Secondary | ICD-10-CM

## 2011-06-29 DIAGNOSIS — I509 Heart failure, unspecified: Secondary | ICD-10-CM

## 2011-06-29 DIAGNOSIS — I2510 Atherosclerotic heart disease of native coronary artery without angina pectoris: Secondary | ICD-10-CM

## 2011-06-29 DIAGNOSIS — E7800 Pure hypercholesterolemia, unspecified: Secondary | ICD-10-CM

## 2011-06-29 DIAGNOSIS — I5032 Chronic diastolic (congestive) heart failure: Secondary | ICD-10-CM

## 2011-06-29 LAB — HEPATIC FUNCTION PANEL
Albumin: 4.3 g/dL (ref 3.5–5.2)
Alkaline Phosphatase: 60 U/L (ref 39–117)
Total Bilirubin: 0.8 mg/dL (ref 0.3–1.2)

## 2011-06-29 LAB — BASIC METABOLIC PANEL
CO2: 27 mEq/L (ref 19–32)
Chloride: 104 mEq/L (ref 96–112)
Creatinine, Ser: 1.1 mg/dL (ref 0.4–1.5)
Glucose, Bld: 132 mg/dL — ABNORMAL HIGH (ref 70–99)

## 2011-06-29 LAB — LIPID PANEL
HDL: 53 mg/dL (ref >39.00)
LDL Cholesterol: 70 mg/dL (ref 0–99)
Total CHOL/HDL Ratio: 3
Triglycerides: 98 mg/dL (ref 0.0–149.0)

## 2011-06-29 NOTE — Assessment & Plan Note (Signed)
Patient does not appear volume overloaded on exam.  EF preserved on LHC last October.  Continue current Lasix dose.  Will get BMET to follow creatinine and K.

## 2011-06-29 NOTE — Assessment & Plan Note (Addendum)
If patient needs knee surgery, he does not need further cardiac testing (stable with no ischemic symptoms and recent cath).  He should take his Coreg peri-operatively.  He is acceptable risk to undergo surgery.

## 2011-06-29 NOTE — Patient Instructions (Signed)
Your physician recommends that you have  a FASTING lipid profile /liver profile/BMET today--414.05  272.0  Your physician wants you to follow-up in: 1 year with Dr Aundra Dubin. (November 2013). You will receive a reminder letter in the mail two months in advance. If you don't receive a letter, please call our office to schedule the follow-up appointment.

## 2011-06-29 NOTE — Assessment & Plan Note (Signed)
Stable with no ischemic symptoms.  Last cath in 10/11, no intervention.  Continue ASA 81, Coreg, Crestor.

## 2011-06-29 NOTE — Assessment & Plan Note (Signed)
Check lipids/LFTs today, goal LDL < 70.  

## 2011-06-29 NOTE — Progress Notes (Signed)
PCP: Dr Harrington Challenger  65 yo with history of CAD s/p CABG and obesity s/p lap banding procedure presents for cardiology followup.  Patient has seen Dr. Olevia Perches in the past and is seeing me for the first time today.  Lately he has been doing well from a cardiac standpoint.  Unfortunately, he recently injured his right knee and may need surgery.  Prior to the knee injury, he was working out 3 days a week in a gym.  On other days, he was walking outside for exercise or on a treadmill (2-5 miles/day).  No exertional dyspnea, no exertional chest pain.  He has occasional episodes of very atypical chest pain.  This is mild and nonexertional.  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   ECG: NSR, iLBBB, LVH, T wave inversions in III and AVF.   PMH: 1. OSA: on CPAP.  2. Diabetes mellitus 3. Allergic rhinitis 4. CAD: s/p CABG 1995.  LHC (10/11) with 90% distal D1 (patent proximal D1 stent), total occlusion CFX, total occlusion RCA, no significant stenoses in LAD, SVG-PDA patent, SVG-OM patent, EF 55%.  Medical management.   5. Obesity: lap-banding in 5/11.  6. Diastolic CHF: Echo (99991111) with EF 55-60%.   7. TIA: Carotid dopplers in 11/11 with 0-39% bilateral stenosis.  8. OA 9. HTN 10. Hyperlipidemia 11. 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  Lives in [place].   FH: Noncontributory.   ROS: All systems reviewed and negative except as per HPI.   Current Outpatient Prescriptions  Medication Sig Dispense Refill  . aspirin 81 MG tablet Take 81 mg by mouth daily.        . carvedilol (COREG) 25 MG tablet Take 1/2 tablet twice a day      . cyclobenzaprine (FLEXERIL) 10 MG tablet Take 10 mg by mouth 3 (three) times daily as needed.        . ergocalciferol (VITAMIN D2) 50000 UNITS capsule Take 50,000 Units by mouth once a week.        . finasteride (PROSCAR) 5 MG tablet Take 5 mg by mouth daily.        . furosemide (LASIX) 20 MG tablet Take 20 mg by mouth  daily.        Marland Kitchen HYDROcodone-acetaminophen (VICODIN) 5-500 MG per tablet Take 1 tablet by mouth every 6 (six) hours as needed.        Marland Kitchen ipratropium (ATROVENT) 0.03 % nasal spray Place 2 sprays into the nose 4 (four) times daily. As needed  30 mL  1  . lansoprazole (PREVACID) 30 MG capsule Take 15 mg by mouth daily.        . metFORMIN (GLUCOPHAGE) 1000 MG tablet Take 1,000 mg by mouth daily with breakfast.        . rosuvastatin (CRESTOR) 20 MG tablet Take 10 mg by mouth daily.        Marland Kitchen testosterone enanthate (DELATESTRYL) 200 MG/ML injection Inject 200mg  every 7 days        BP 122/76  Pulse 55  Ht 5\' 10"  (1.778 m)  Wt 100.358 kg (221 lb 4 oz)  BMI 31.75 kg/m2 General: NAD Neck: No JVD, no thyromegaly or thyroid nodule.  Lungs: Clear to auscultation bilaterally with normal respiratory effort. CV: Nondisplaced PMI.  Heart regular S1/S2, no S3/S4, no murmur.  No peripheral edema.  No carotid bruit.  Normal pedal pulses.  Abdomen: Soft, nontender, no hepatosplenomegaly, no distention.  Neurologic: Alert and  oriented x 3.  Psych: Normal affect. Extremities: No clubbing or cyanosis.

## 2011-07-07 ENCOUNTER — Other Ambulatory Visit: Payer: Self-pay | Admitting: Orthopedic Surgery

## 2011-07-15 ENCOUNTER — Encounter (HOSPITAL_BASED_OUTPATIENT_CLINIC_OR_DEPARTMENT_OTHER): Payer: Self-pay | Admitting: *Deleted

## 2011-07-20 ENCOUNTER — Encounter (HOSPITAL_BASED_OUTPATIENT_CLINIC_OR_DEPARTMENT_OTHER): Payer: Self-pay | Admitting: *Deleted

## 2011-07-21 ENCOUNTER — Other Ambulatory Visit: Payer: Self-pay | Admitting: Orthopedic Surgery

## 2011-07-21 ENCOUNTER — Encounter (HOSPITAL_BASED_OUTPATIENT_CLINIC_OR_DEPARTMENT_OTHER): Payer: Self-pay | Admitting: *Deleted

## 2011-07-21 MED ORDER — DEXTROSE 5 % IV SOLN: 1.0000 g | Freq: Once | INTRAVENOUS | Status: DC

## 2011-07-21 NOTE — Progress Notes (Signed)
NPO AFTER MN WITH EXCEPTION WATER/ GATORADE UNTIL 0700. PT TO ARRIVE AT 1115. NEEDS ISTAT. CURRENT EKG IN EPIC. WILL TAKE COREG AND PREVACID AM OF SURG. W/ SIPS OF WATER.

## 2011-07-21 NOTE — H&P (Signed)
CC- [name] is a 65 y.o. male who presents with right knee pain.  HPI- . Knee Pain: Patient presents with knee swelling involving the  right knee. Onset of the symptoms was about a month ago. Inciting event: pain and swelling after bending down. Current symptoms include stiffness and swelling. Pain is aggravated by any weight bearing, going up and down stairs, pivoting, rising after sitting, standing and walking.  Patient has had prior knee problems. Evaluation to date: plain films: no significant change from prior studies. Treatment to date: brace which is not very effective and aspiration.  Past Medical History  Diagnosis Date  . Diastolic CHF, chronic EF 0000000  . Hypertension   . Coronary artery disease CARDIOLOGIST- [facility]    LAST VISIT 06-29-11 W/ CHART  . History of transient ischemic attack (TIA)     CAROTID DOPPLERS NOV 2011  0-39& BIL. STENOSIS  . Hyperlipidemia   . Allergic rhinitis   . History of diverticulitis of colon     5 YRS AGO  . Status post coronary artery bypass grafting 1995  X3  . Heart attack 1995    s/p cabg  . Sleep apnea CPAP EVERY NIGHT  . Diabetes mellitus STATES RELATED TO EXPOSURE TO AGENT  ORANGE    ORAL MEDS  . OA (osteoarthritis)     RIGHT KNEE ARTHOFIBROSIS W/ PAIN  (S/P  REPLACEMENT 2004)    Past Surgical History  Procedure Date  . Right knee ed compartment replacement 2004  . Laparoscopic gastric banding 01-15-09  . Knee arthroscopy w/ meniscectomy X2 IN 2002-- RIGHT KNEE  . Umbilical hernia repair [id number]    W/ GASTRIC BANDING PROCEDURE  . Coronary artery bypass graft 1995    X3 VESSEL  . Coronary angioplasty 1996-- POST CABG    W/ STENT  . Blepharoplasty 1985    BILATERAL  . Circumcision 35 YRS  AGO    Prior to Admission medications   Medication Sig Start Date End Date Taking? Authorizing Provider  aspirin 81 MG tablet Take 81 mg by mouth daily.    Yes Historical Provider, MD  carvedilol (COREG) 25 MG tablet Take 12.5 mg  by mouth 2 (two) times daily with a meal. Take 1/2 tablet twice a day 06/29/11  Yes [name], MD  cyclobenzaprine (FLEXERIL) 10 MG tablet Take 10 mg by mouth 3 (three) times daily as needed.    Yes Historical Provider, MD  furosemide (LASIX) 20 MG tablet Take 20 mg by mouth daily.    Yes Historical Provider, MD  HYDROcodone-acetaminophen (VICODIN) 5-500 MG per tablet Take 1 tablet by mouth every 6 (six) hours as needed.    Yes Historical Provider, MD  ipratropium (ATROVENT) 0.03 % nasal spray Place 2 sprays into the nose 4 (four) times daily as needed. As needed  05/12/11 05/11/12 Yes Clinton D Young, MD  lansoprazole (PREVACID) 30 MG capsule Take 15 mg by mouth as needed.    Yes Historical Provider, MD  metFORMIN (GLUCOPHAGE) 1000 MG tablet Take 500 mg by mouth 2 (two) times daily with a meal.    Yes Historical Provider, MD  rosuvastatin (CRESTOR) 20 MG tablet Take 5 mg by mouth every evening.    Yes Historical Provider, MD  testosterone enanthate (DELATESTRYL) 200 MG/ML injection 200 mg. Inject 200mg  every 7 days 03/26/11  Yes Historical Provider, MD  ergocalciferol (VITAMIN D2) 50000 UNITS capsule Take 50,000 Units by mouth every 21 ( twenty-one) days.     Historical Provider, MD  RIGHT KNEE EXAM soft tissue tenderness over lateral side, effusion, reduced range of motion, collateral ligaments intact, normal ipsilateral hip exam  Physical Examination: General appearance - alert, well appearing, and in no distress Mental status - alert, oriented to person, place, and time Chest - clear to auscultation, no wheezes, rales or rhonchi, symmetric air entry Heart - normal rate, regular rhythm, normal S1, S2, no murmurs, rubs, clicks or gallops Abdomen - soft, nontender, nondistended, no masses or organomegaly   Asessment/Plan--- Right knee loose body and possible meniscal tear- - Plan right knee arthroscopy with meniscal debridement. Procedure risks and potential comps discussed with patient who  elects to proceed. Goals are decreased pain and increased function with a high likelihood of achieving both

## 2011-07-22 ENCOUNTER — Encounter (HOSPITAL_BASED_OUTPATIENT_CLINIC_OR_DEPARTMENT_OTHER): Payer: Self-pay | Admitting: Anesthesiology

## 2011-07-22 ENCOUNTER — Encounter (HOSPITAL_BASED_OUTPATIENT_CLINIC_OR_DEPARTMENT_OTHER)
Admission: RE | Disposition: A | Discharge status: Home / Self Care | Payer: Self-pay | Source: Ambulatory Visit | Attending: Orthopedic Surgery

## 2011-07-22 ENCOUNTER — Ambulatory Visit (HOSPITAL_BASED_OUTPATIENT_CLINIC_OR_DEPARTMENT_OTHER)
Admission: RE | Admit: 2011-07-22 | Discharge: 2011-07-22 | Disposition: A | Discharge status: Home / Self Care | Payer: Medicare Other | Source: Ambulatory Visit | Attending: Orthopedic Surgery | Admitting: Orthopedic Surgery

## 2011-07-22 ENCOUNTER — Encounter (HOSPITAL_BASED_OUTPATIENT_CLINIC_OR_DEPARTMENT_OTHER): Payer: Self-pay | Admitting: *Deleted

## 2011-07-22 ENCOUNTER — Encounter (HOSPITAL_BASED_OUTPATIENT_CLINIC_OR_DEPARTMENT_OTHER): Payer: Self-pay | Admitting: Orthopedic Surgery

## 2011-07-22 ENCOUNTER — Ambulatory Visit (HOSPITAL_BASED_OUTPATIENT_CLINIC_OR_DEPARTMENT_OTHER): Payer: Medicare Other | Admitting: Anesthesiology

## 2011-07-22 DIAGNOSIS — M1990 Unspecified osteoarthritis, unspecified site: Secondary | ICD-10-CM | POA: Insufficient documentation

## 2011-07-22 DIAGNOSIS — Z951 Presence of aortocoronary bypass graft: Secondary | ICD-10-CM | POA: Insufficient documentation

## 2011-07-22 DIAGNOSIS — I509 Heart failure, unspecified: Secondary | ICD-10-CM | POA: Insufficient documentation

## 2011-07-22 DIAGNOSIS — G4730 Sleep apnea, unspecified: Secondary | ICD-10-CM | POA: Insufficient documentation

## 2011-07-22 DIAGNOSIS — Z79899 Other long term (current) drug therapy: Secondary | ICD-10-CM | POA: Insufficient documentation

## 2011-07-22 DIAGNOSIS — E119 Type 2 diabetes mellitus without complications: Secondary | ICD-10-CM | POA: Insufficient documentation

## 2011-07-22 DIAGNOSIS — I252 Old myocardial infarction: Secondary | ICD-10-CM | POA: Insufficient documentation

## 2011-07-22 DIAGNOSIS — E785 Hyperlipidemia, unspecified: Secondary | ICD-10-CM | POA: Insufficient documentation

## 2011-07-22 DIAGNOSIS — M2340 Loose body in knee, unspecified knee: Secondary | ICD-10-CM | POA: Insufficient documentation

## 2011-07-22 DIAGNOSIS — Z7982 Long term (current) use of aspirin: Secondary | ICD-10-CM | POA: Insufficient documentation

## 2011-07-22 DIAGNOSIS — M6789 Other specified disorders of synovium and tendon, multiple sites: Secondary | ICD-10-CM | POA: Insufficient documentation

## 2011-07-22 DIAGNOSIS — M25569 Pain in unspecified knee: Secondary | ICD-10-CM

## 2011-07-22 DIAGNOSIS — I5032 Chronic diastolic (congestive) heart failure: Secondary | ICD-10-CM | POA: Insufficient documentation

## 2011-07-22 DIAGNOSIS — M2390 Unspecified internal derangement of unspecified knee: Secondary | ICD-10-CM | POA: Insufficient documentation

## 2011-07-22 DIAGNOSIS — I2510 Atherosclerotic heart disease of native coronary artery without angina pectoris: Secondary | ICD-10-CM | POA: Insufficient documentation

## 2011-07-22 HISTORY — PX: SYNOVECTOMY: SHX5180

## 2011-07-22 HISTORY — DX: Atherosclerotic heart disease of native coronary artery without angina pectoris: I25.10

## 2011-07-22 HISTORY — DX: Unspecified osteoarthritis, unspecified site: M19.90

## 2011-07-22 HISTORY — DX: Hyperlipidemia, unspecified: E78.5

## 2011-07-22 HISTORY — PX: KNEE ARTHROSCOPY: SHX127

## 2011-07-22 HISTORY — DX: Chronic diastolic (congestive) heart failure: I50.32

## 2011-07-22 HISTORY — DX: Personal history of transient ischemic attack (TIA), and cerebral infarction without residual deficits: Z86.73

## 2011-07-22 HISTORY — DX: Essential (primary) hypertension: I10

## 2011-07-22 HISTORY — PX: CHONDROPLASTY: SHX5177

## 2011-07-22 HISTORY — DX: Personal history of other diseases of the digestive system: Z87.19

## 2011-07-22 LAB — GLUCOSE, CAPILLARY: Glucose-Capillary: 102 mg/dL — ABNORMAL HIGH (ref 70–99)

## 2011-07-22 SURGERY — ARTHROSCOPY, KNEE
Anesthesia: General | Site: Knee | Laterality: Right | Wound class: Clean

## 2011-07-22 MED ORDER — BUPIVACAINE-EPINEPHRINE 0.25% -1:200000 IJ SOLN
INTRAMUSCULAR | Status: DC | PRN
  Administered 2011-07-22: 20 mL

## 2011-07-22 MED ORDER — PROMETHAZINE HCL 25 MG/ML IJ SOLN: 6.2500 mg | INTRAMUSCULAR | Status: DC | PRN

## 2011-07-22 MED ORDER — OXYCODONE-ACETAMINOPHEN 5-325 MG PO TABS: 1.0000 | ORAL_TABLET | ORAL | Status: AC | PRN

## 2011-07-22 MED ORDER — CEFAZOLIN SODIUM-DEXTROSE 2-3 GM-% IV SOLR
2.0000 g | Freq: Once | INTRAVENOUS | Status: AC
  Administered 2011-07-22: 2 g via INTRAVENOUS

## 2011-07-22 MED ORDER — SODIUM CHLORIDE 0.9 % IV SOLN: INTRAVENOUS | Status: DC

## 2011-07-22 MED ORDER — FENTANYL CITRATE 0.05 MG/ML IJ SOLN
INTRAMUSCULAR | Status: DC | PRN
  Administered 2011-07-22 (×4): 50 ug via INTRAVENOUS

## 2011-07-22 MED ORDER — LIDOCAINE HCL (CARDIAC) 20 MG/ML IV SOLN
INTRAVENOUS | Status: DC | PRN
  Administered 2011-07-22: 60 mg via INTRAVENOUS

## 2011-07-22 MED ORDER — METHOCARBAMOL 500 MG PO TABS: 500.0000 mg | ORAL_TABLET | Freq: Four times a day (QID) | ORAL | Status: AC

## 2011-07-22 MED ORDER — SODIUM CHLORIDE 0.9 % IR SOLN
Status: DC | PRN
  Administered 2011-07-22: 9000 mL

## 2011-07-22 MED ORDER — GLYCOPYRROLATE 0.2 MG/ML IJ SOLN
INTRAMUSCULAR | Status: DC | PRN
  Administered 2011-07-22: .4 mg via INTRAVENOUS

## 2011-07-22 MED ORDER — PROPOFOL 10 MG/ML IV EMUL
INTRAVENOUS | Status: DC | PRN
  Administered 2011-07-22: 200 mg via INTRAVENOUS

## 2011-07-22 MED ORDER — FENTANYL CITRATE 0.05 MG/ML IJ SOLN
25.0000 ug | INTRAMUSCULAR | Status: DC | PRN
  Administered 2011-07-22: 25 ug via INTRAVENOUS

## 2011-07-22 MED ORDER — OXYCODONE-ACETAMINOPHEN 5-325 MG/5ML PO SOLN: 5.0000 mL | ORAL | Status: DC | PRN

## 2011-07-22 MED ORDER — CHLORHEXIDINE GLUCONATE 4 % EX LIQD: 60.0000 mL | Freq: Once | CUTANEOUS | Status: DC

## 2011-07-22 MED ORDER — OXYCODONE-ACETAMINOPHEN 5-325 MG PO TABS
1.0000 | ORAL_TABLET | ORAL | Status: AC | PRN
  Administered 2011-07-22: 1 via ORAL

## 2011-07-22 MED ORDER — LACTATED RINGERS IV SOLN
INTRAVENOUS | Status: DC
  Administered 2011-07-22: 15:00:00 via INTRAVENOUS
  Administered 2011-07-22: 100 mL/h via INTRAVENOUS
  Administered 2011-07-22: 12:00:00 via INTRAVENOUS

## 2011-07-22 MED ORDER — LACTATED RINGERS IV SOLN: INTRAVENOUS | Status: DC

## 2011-07-22 MED ORDER — ONDANSETRON HCL 4 MG/2ML IJ SOLN
INTRAMUSCULAR | Status: DC | PRN
  Administered 2011-07-22: 4 mg via INTRAVENOUS

## 2011-07-22 SURGICAL SUPPLY — 28 items
BANDAGE ELASTIC 6 VELCRO ST LF (GAUZE/BANDAGES/DRESSINGS) ×3 IMPLANT
BLADE 4.2CUDA (BLADE) ×3 IMPLANT
CANISTER SUCT LVC 12 LTR MEDI- (MISCELLANEOUS) ×3 IMPLANT
CLOTH BEACON ORANGE TIMEOUT ST (SAFETY) ×3 IMPLANT
DRAPE ARTHROSCOPY W/POUCH 114 (DRAPES) ×3 IMPLANT
DRSG EMULSION OIL 3X3 NADH (GAUZE/BANDAGES/DRESSINGS) ×3 IMPLANT
DURAPREP 26ML APPLICATOR (WOUND CARE) ×3 IMPLANT
GLOVE BIO SURGEON STRL SZ8 (GLOVE) ×3 IMPLANT
GLOVE BIOGEL PI IND STRL 6.5 (GLOVE) IMPLANT
GLOVE BIOGEL PI INDICATOR 6.5 (GLOVE) ×1
GLOVE ECLIPSE 6.5 STRL STRAW (GLOVE) ×1 IMPLANT
GLOVE INDICATOR 8.0 STRL GRN (GLOVE) ×3 IMPLANT
GOWN PREVENTION PLUS LG XLONG (DISPOSABLE) ×4 IMPLANT
IV NS IRRIG 3000ML ARTHROMATIC (IV SOLUTION) ×4 IMPLANT
KNEE WRAP E Z 3 GEL PACK (MISCELLANEOUS) ×3 IMPLANT
PACK ARTHROSCOPY DSU (CUSTOM PROCEDURE TRAY) ×3 IMPLANT
PACK BASIN DAY SURGERY FS (CUSTOM PROCEDURE TRAY) ×3 IMPLANT
PADDING CAST ABS 4INX4YD NS (CAST SUPPLIES) ×1
PADDING CAST ABS COTTON 4X4 ST (CAST SUPPLIES) ×2 IMPLANT
PADDING CAST COTTON 6X4 STRL (CAST SUPPLIES) ×3 IMPLANT
PADDING WEBRIL 6 STERILE (GAUZE/BANDAGES/DRESSINGS) ×1 IMPLANT
SET ARTHROSCOPY TUBING (MISCELLANEOUS) ×3
SET ARTHROSCOPY TUBING LN (MISCELLANEOUS) ×2 IMPLANT
SPONGE GAUZE 4X4 12PLY (GAUZE/BANDAGES/DRESSINGS) ×3 IMPLANT
SUT ETHILON 4 0 PS 2 18 (SUTURE) ×3 IMPLANT
TOWEL OR 17X24 6PK STRL BLUE (TOWEL DISPOSABLE) ×3 IMPLANT
WAND 90 DEG TURBOVAC W/CORD (SURGICAL WAND) ×1 IMPLANT
WATER STERILE IRR 500ML POUR (IV SOLUTION) ×3 IMPLANT

## 2011-07-22 NOTE — Brief Op Note (Signed)
Brief op note  Pre-op dx- Right knee loose bodies and lateral meniscal tear  Post-op dx- Right knee loose bodies and chondral defects  Procedure- Right knee arthroscopy with synovectomy and chondroplasty  Surg- [name]  Anesthesia- General  EBL- minimal  Comps- None  Condition-- Stable to PACU  Dictation [phone]

## 2011-07-22 NOTE — Anesthesia Postprocedure Evaluation (Signed)
  Anesthesia Post-op Note  Patient: [name]  Procedure(s) Performed:  ARTHROSCOPY KNEE - WITH DEBRIDEMENT ; CHONDROPLASTY; SYNOVECTOMY  Patient Location: PACU  Anesthesia Type: General  Level of Consciousness: alert  and oriented  Airway and Oxygen Therapy: Patient Spontanous Breathing  Post-op Pain: mild  Post-op Assessment: Post-op Vital signs reviewed, Patient's Cardiovascular Status Stable, Respiratory Function Stable and Patent Airway  Post-op Vital Signs: stable  Complications: No apparent anesthesia complications

## 2011-07-22 NOTE — Interval H&P Note (Signed)
History and Physical Interval Note:  07/22/2011 2:03 PM  [name]  has presented today for surgery, with the diagnosis of ARTHOFIBROSIS OF THE RIGHT KNEE  The various methods of treatment have been discussed with the patient and family. After consideration of risks, benefits and other options for treatment, the patient has consented to  Procedure(s): ARTHROSCOPY KNEE as a surgical intervention .  The patients' history has been reviewed, patient examined, no change in status, stable for surgery.  I have reviewed the patients' chart and labs.  Questions were answered to the patient's satisfaction.     [name]

## 2011-07-22 NOTE — Transfer of Care (Signed)
Immediate Anesthesia Transfer of Care Note  Patient: [name]  Procedure(s) Performed:  ARTHROSCOPY KNEE - WITH DEBRIDEMENT ; CHONDROPLASTY; SYNOVECTOMY  Patient Location: Patient transported to PACU with oxygen via face mask at 6 Liters / Min  Anesthesia Type: General  Level of Consciousness: awake and alert   Airway & Oxygen Therapy: Patient Spontanous Breathing and Patient connected to face mask oxygen Post-op Assessment: Report given to PACU RN and Post -op Vital signs reviewed and stable  Post vital signs: Reviewed and stable  Complications: No apparent anesthesia complications, Small Cut on upper lip.

## 2011-07-22 NOTE — Anesthesia Preprocedure Evaluation (Signed)
Anesthesia Evaluation  Patient identified by MRN, date of birth, ID band Patient awake    Reviewed: Allergy & Precautions, H&P , NPO status , Patient's Chart, lab work & pertinent test results  Airway Mallampati: II TM Distance: >3 FB Neck ROM: Full    Dental No notable dental hx.    Pulmonary neg pulmonary ROS, sleep apnea and Continuous Positive Airway Pressure Ventilation ,  clear to auscultation  Pulmonary exam normal       Cardiovascular hypertension, Pt. on home beta blockers + angina + CAD, + Past MI and neg cardio ROS Regular Normal    Neuro/Psych Negative Neurological ROS  Negative Psych ROS   GI/Hepatic negative GI ROS, Neg liver ROS,   Endo/Other  Negative Endocrine ROSDiabetes mellitus-, Well Controlled  Renal/GU negative Renal ROS  Genitourinary negative   Musculoskeletal negative musculoskeletal ROS (+)   Abdominal (+) obese,   Peds negative pediatric ROS (+)  Hematology negative hematology ROS (+)   Anesthesia Other Findings   Reproductive/Obstetrics negative OB ROS                           Anesthesia Physical Anesthesia Plan  ASA: III  Anesthesia Plan: General   Post-op Pain Management:    Induction: Intravenous  Airway Management Planned: LMA  Additional Equipment:   Intra-op Plan:   Post-operative Plan: Extubation in OR  Informed Consent: I have reviewed the patients History and Physical, chart, labs and discussed the procedure including the risks, benefits and alternatives for the proposed anesthesia with the patient or authorized representative who has indicated his/her understanding and acceptance.   Dental advisory given  Plan Discussed with: CRNA  Anesthesia Plan Comments:         Anesthesia Quick Evaluation

## 2011-07-22 NOTE — Anesthesia Procedure Notes (Addendum)
Procedure Name: LMA Insertion Date/Time: 07/22/2011 2:17 PM Performed by: Edwyna Perfect Pre-anesthesia Checklist: Patient identified, Emergency Drugs available, Suction available and Patient being monitored Patient Re-evaluated:Patient Re-evaluated prior to inductionOxygen Delivery Method: Circle System Utilized Preoxygenation: Pre-oxygenation with 100% oxygen Intubation Type: IV induction Ventilation: Mask ventilation without difficulty LMA: LMA with gastric port inserted LMA Size: 4.0 Number of attempts: 1 Placement Confirmation: positive ETCO2 Tube secured with: Tape Dental Injury: Teeth and Oropharynx as per pre-operative assessment

## 2011-07-23 ENCOUNTER — Encounter (HOSPITAL_BASED_OUTPATIENT_CLINIC_OR_DEPARTMENT_OTHER): Payer: Self-pay | Admitting: Orthopedic Surgery

## 2011-07-23 LAB — POCT I-STAT 4, (NA,K, GLUC, HGB,HCT)
Potassium: 4.4 mEq/L (ref 3.5–5.1)
Sodium: 142 mEq/L (ref 135–145)

## 2011-07-23 NOTE — Op Note (Signed)
NAME:  [name], [name] NO.:  [id number]  MEDICAL RECORD NO.:  [id number]  LOCATION:                                 FACILITY:  PHYSICIAN:  Gaynelle Arabian, M.D.    DATE OF BIRTH:  [birth year]  DATE OF PROCEDURE:  07/22/2011 DATE OF DISCHARGE:                              OPERATIVE REPORT   PREOPERATIVE DIAGNOSIS:  Right knee loose bodies and possible lateral meniscal tear.  POSTOPERATIVE DIAGNOSIS:  Right knee loose bodies and chondral defect trochlea.  PROCEDURE:  Right knee arthroscopy with synovectomy and chondroplasty.  SURGEON:  Gaynelle Arabian, M.D.  ASSISTANT:  None.  ANESTHESIA:  General.  ESTIMATED BLOOD LOSS:  Minimal.  DRAINS:  None.  COMPLICATION:  None.  CONDITION:  Stable to recovery.  CLINICAL NOTE:  [name] is a 65 year old male, who had a previous right knee unicompartmental replacement several years ago.  Recently, developed significant pain and mechanical symptoms in the knee.  X-ray showed no evidence of any loosening of the component.  He has had recurrent effusions consistent with possible loose body reaction in knee.  He presents now for scope and debridement.  PROCEDURE IN DETAIL:  After successful administration of general anesthetic, a tourniquet was placed high on the right thigh, and right lower extremity prepped and draped in usual sterile fashion.  Standard superomedial and inferolateral incisions made, inflow cannula passed superomedial, camera passed inferolateral.  Arthroscopic visualization proceeds.  Undersurface of patella shows some exposed bone and grade 3 chondromalacia also.  There is no unstable cartilage.  The trochlea has some grade 3 change with unstable cartilage centrally.  The tremendous amount of hypertrophic synovium and tremendous amount of loose bodies in the suprapatellar region as well as in medial and lateral gutters.  The medial compartment was inspected.  Both components were probed and found to  be stable.  No evidence of any gross loosening.  Some of the hypertrophic synovium is impinging in the medial joint.  Spinal needle was used to localize the inferomedial portal and small incision made. The hypertrophic synovium was debrided with a shaver and the ArthroCare device to cauterize.  This was done throughout the gutters and suprapatellar area.  The unstable cartilage on the surface of the trochlea debrided back to stable bony base with stable cartilaginous edges.  The bone was abraded.  The intercondylar notch was then visualized.  There were some intercondylar osteophyte with the ACL appears intact.  Lateral compartment was entered.  There was some degenerative change in the lateral compartment.  No evidence of any exposed bone or unstable cartilage.  There was grade 2 and 3 change on the chondral surfaces and some surface fraying at the lateral meniscus, but no gross tears.  Joints again inspected.  No other tears, defects, or loose bodies noted.  Arthroscopic equipment was removed from inferior portals, which were closed with interrupted 4-0 nylon.  A 20 mL of 0.25% Marcaine with epi injected through the inflow cannula, and that was removed and that portal closed with nylon.  The incision was clean and dried and bulky sterile dressing applied.  He was awakened and transported to recovery in stable condition.  Gaynelle Arabian, M.D.     FA/MEDQ  D:  07/22/2011  T:  07/22/2011  Job:  [id number]

## 2011-09-14 DIAGNOSIS — L570 Actinic keratosis: Secondary | ICD-10-CM | POA: Diagnosis not present

## 2011-09-21 DIAGNOSIS — R42 Dizziness and giddiness: Secondary | ICD-10-CM | POA: Diagnosis not present

## 2011-09-22 ENCOUNTER — Other Ambulatory Visit: Payer: Self-pay | Admitting: Family Medicine

## 2011-09-22 DIAGNOSIS — H539 Unspecified visual disturbance: Secondary | ICD-10-CM

## 2011-09-22 DIAGNOSIS — R42 Dizziness and giddiness: Secondary | ICD-10-CM

## 2011-09-23 ENCOUNTER — Ambulatory Visit
Admission: RE | Admit: 2011-09-23 | Discharge: 2011-09-23 | Disposition: A | Discharge status: Home / Self Care | Payer: Medicare Other | Source: Ambulatory Visit | Attending: Family Medicine | Admitting: Family Medicine

## 2011-09-23 DIAGNOSIS — R5381 Other malaise: Secondary | ICD-10-CM | POA: Diagnosis not present

## 2011-09-23 DIAGNOSIS — R42 Dizziness and giddiness: Secondary | ICD-10-CM

## 2011-09-23 DIAGNOSIS — H539 Unspecified visual disturbance: Secondary | ICD-10-CM

## 2011-09-23 DIAGNOSIS — H538 Other visual disturbances: Secondary | ICD-10-CM | POA: Diagnosis not present

## 2011-09-23 MED ORDER — IOHEXOL 300 MG/ML  SOLN
75.0000 mL | Freq: Once | INTRAMUSCULAR | Status: AC | PRN
  Administered 2011-09-23: 75 mL via INTRAVENOUS

## 2011-10-05 DIAGNOSIS — L570 Actinic keratosis: Secondary | ICD-10-CM | POA: Diagnosis not present

## 2011-10-16 DIAGNOSIS — L570 Actinic keratosis: Secondary | ICD-10-CM | POA: Diagnosis not present

## 2011-11-13 DIAGNOSIS — L570 Actinic keratosis: Secondary | ICD-10-CM | POA: Diagnosis not present

## 2011-11-26 DIAGNOSIS — H0230 Blepharochalasis unspecified eye, unspecified eyelid: Secondary | ICD-10-CM | POA: Diagnosis not present

## 2011-11-26 DIAGNOSIS — H2510 Age-related nuclear cataract, unspecified eye: Secondary | ICD-10-CM | POA: Diagnosis not present

## 2011-11-26 DIAGNOSIS — E119 Type 2 diabetes mellitus without complications: Secondary | ICD-10-CM | POA: Diagnosis not present

## 2011-12-31 ENCOUNTER — Other Ambulatory Visit: Payer: Self-pay | Admitting: Orthopedic Surgery

## 2011-12-31 NOTE — Progress Notes (Signed)
Preoperative surgical orders have been place into the Epic hospital system for [name] on 12/31/2011, 11:44 AM  by Mickel Crow for surgery on 04/27/2012.  Preop Total Knee orders including Bupivacaine On-Q pump, IV Tylenol, and IV Decadron as long as there are no contraindications to the above medications.

## 2012-01-01 ENCOUNTER — Ambulatory Visit (INDEPENDENT_AMBULATORY_CARE_PROVIDER_SITE_OTHER): Payer: Medicare Other | Admitting: Physician Assistant

## 2012-01-01 ENCOUNTER — Encounter: Payer: Self-pay | Admitting: Physician Assistant

## 2012-01-01 ENCOUNTER — Encounter: Payer: Self-pay | Admitting: *Deleted

## 2012-01-01 VITALS — BP 106/76 | HR 51 | Ht 70.0 in | Wt 229.0 lb

## 2012-01-01 DIAGNOSIS — I10 Essential (primary) hypertension: Secondary | ICD-10-CM

## 2012-01-01 DIAGNOSIS — R079 Chest pain, unspecified: Secondary | ICD-10-CM

## 2012-01-01 DIAGNOSIS — I509 Heart failure, unspecified: Secondary | ICD-10-CM

## 2012-01-01 DIAGNOSIS — R0602 Shortness of breath: Secondary | ICD-10-CM | POA: Diagnosis not present

## 2012-01-01 DIAGNOSIS — I2510 Atherosclerotic heart disease of native coronary artery without angina pectoris: Secondary | ICD-10-CM

## 2012-01-01 DIAGNOSIS — I5032 Chronic diastolic (congestive) heart failure: Secondary | ICD-10-CM

## 2012-01-01 LAB — CBC WITH DIFFERENTIAL/PLATELET
Basophils Absolute: 0.1 10*3/uL (ref 0.0–0.1)
Eosinophils Absolute: 0.4 10*3/uL (ref 0.0–0.7)
Hemoglobin: 15.7 g/dL (ref 13.0–17.0)
Lymphocytes Relative: 25 % (ref 12.0–46.0)
MCHC: 33.9 g/dL (ref 30.0–36.0)
Neutro Abs: 3.7 10*3/uL (ref 1.4–7.7)
Platelets: 170 10*3/uL (ref 150.0–400.0)
RDW: 12.5 % (ref 11.5–14.6)

## 2012-01-01 LAB — BRAIN NATRIURETIC PEPTIDE: Pro B Natriuretic peptide (BNP): 48 pg/mL (ref 0.0–100.0)

## 2012-01-01 LAB — BASIC METABOLIC PANEL
BUN: 17 mg/dL (ref 6–23)
CO2: 28 mEq/L (ref 19–32)
Calcium: 9.7 mg/dL (ref 8.4–10.5)
Creatinine, Ser: 1.1 mg/dL (ref 0.4–1.5)
Glucose, Bld: 122 mg/dL — ABNORMAL HIGH (ref 70–99)

## 2012-01-01 MED ORDER — NITROGLYCERIN 0.4 MG SL SUBL: 0.4000 mg | SUBLINGUAL_TABLET | SUBLINGUAL | Status: DC | PRN

## 2012-01-01 NOTE — Patient Instructions (Signed)
Your physician has requested that you have a LEFT cardiac catheterization ON 01/07/12 @ 1:30 PM WITH DR. Aundra Dubin. Cardiac catheterization is used to diagnose and/or treat various heart conditions. Doctors may recommend this procedure for a number of different reasons. The most common reason is to evaluate chest pain. Chest pain can be a symptom of coronary artery disease (CAD), and cardiac catheterization can show whether plaque is narrowing or blocking your heart's arteries. This procedure is also used to evaluate the valves, as well as measure the blood flow and oxygen levels in different parts of your heart. For further information please visit [email or web address]. Please follow instruction sheet, as given.  PRE CATH LABS TODAY BMET, CBC W/DIFF, PT/INR AND BNP

## 2012-01-01 NOTE — Progress Notes (Signed)
[name] [place], [place]  [address] Phone: [phone] Fax:  [phone]  Date:  01/01/2012   Name:  [name]   DOB:  July 23, 1946   MRN:  [id number]  PCP:  [name], MD, MD  Primary Cardiologist:  Dr. [name]  Primary Electrophysiologist:  None    History of Present Illness: [name] is a 66 y.o. male who returns for surgical clearance.  He has a history of CAD s/p CABG and obesity s/p lap banding procedure. Patient had seen Dr. Olevia Perches in the past and established with Dr. [name] 06/2011.  His bypass surgery was in 1995 and has had prior PCI to the diagonal.  LHC 05/2010: stent in Dx patent, dLAD 90% in Dx branch, pCFX occluded, oOM1 90%, pRCA occluded, S-PDA ok, S-OM ok with 40% in distal portion of graft, EF 55%.  The patient had arthroscopic knee surgery after seeing Dr. [name] that went ok.  He now needs right knee TKR.  This is scheduled in 04/2012.  Patient has noted problems with increased fatigue, DOE and intermittent CP over the last month.  CP radiates to his back.  Cannot really relate to activity.  No orthopnea, PND, edema.  Decreased activity over last several mos with 30 lb weight gain due to knee problems.  Overall, dyspnea and CP seems similar to prior angina. Has not tried any NTG.     Wt Readings from Last 3 Encounters:  01/01/12 229 lb (103.874 kg)  07/21/11 200 lb (90.719 kg)  07/21/11 200 lb (90.719 kg)     Potassium  Date/Time Value Range Status  07/22/2011 12:17 PM 4.4  3.5-5.1 (mEq/L) Final     Creatinine, Ser  Date/Time Value Range Status  06/29/2011 10:20 AM 1.1  0.4-1.5 (mg/dL) Final     ALT  Date/Time Value Range Status  06/29/2011 10:20 AM 16  0-53 (U/L) Final     Hemoglobin  Date/Time Value Range Status  07/22/2011 12:17 PM 15.0  13.0-17.0 (g/dL) Final    Past Medical History: 1. OSA: on CPAP.  2. Diabetes mellitus  3. Allergic rhinitis  4. CAD: s/p CABG 1995. LHC (10/11)  with 90% distal D1 (patent proximal D1 stent), total occlusion CFX, total occlusion RCA, no significant stenoses in LAD, SVG-PDA patent, SVG-OM patent, EF 55%. Medical management.  5. Obesity: lap-banding in 5/11.  6. Diastolic CHF: Echo (99991111) with EF 55-60%.  7. TIA: Carotid dopplers in 11/11 with 0-39% bilateral stenosis.  8. OA  9. HTN  10. Hyperlipidemia  11. Diverticulosis  Past Surgical History  Procedure Date  . Right knee ed compartment replacement 2004  . Laparoscopic gastric banding 01-15-09  . Knee arthroscopy w/ meniscectomy X2 IN 2002-- RIGHT KNEE  . Umbilical hernia repair [id number]    W/ GASTRIC BANDING PROCEDURE  . Coronary artery bypass graft 1995    X3 VESSEL  . Coronary angioplasty 1996-- POST CABG    W/ STENT  . Blepharoplasty 1985    BILATERAL  . Circumcision 35 YRS  AGO  . Knee arthroscopy 07/22/2011    Procedure: ARTHROSCOPY KNEE;  Surgeon: [name];  Location: [facility];  Service: Orthopedics;  Laterality: Right;  WITH DEBRIDEMENT   . Chondroplasty 07/22/2011    Procedure: CHONDROPLASTY;  Surgeon: [name];  Location: [facility];  Service: Orthopedics;;  . Synovectomy 07/22/2011    Procedure: SYNOVECTOMY;  Surgeon: Dione Plover Aluisio;  Location: [facility];  Service: Orthopedics;;      Current Outpatient Prescriptions  Medication Sig Dispense Refill  . aspirin 81 MG tablet Take 81 mg by mouth daily.       . carvedilol (COREG) 25 MG tablet Take 12.5 mg by mouth 2 (two) times daily with a meal. Take 1/2 tablet twice a day      . cyclobenzaprine (FLEXERIL) 10 MG tablet Take 10 mg by mouth 3 (three) times daily as needed.       . ergocalciferol (VITAMIN D2) 50000 UNITS capsule Take 50,000 Units by mouth. Take one tablet every two weeks.      . furosemide (LASIX) 20 MG tablet Take 20 mg by mouth daily.       Marland Kitchen HYDROcodone-acetaminophen (VICODIN) 5-500 MG per tablet Take 1 tablet by mouth every 6  (six) hours as needed.       Marland Kitchen ipratropium (ATROVENT) 0.03 % nasal spray Place 2 sprays into the nose 4 (four) times daily as needed. As needed       . lansoprazole (PREVACID) 30 MG capsule Take 15 mg by mouth as needed.       . metFORMIN (GLUCOPHAGE) 1000 MG tablet Take 500 mg by mouth 2 (two) times daily with a meal.       . rosuvastatin (CRESTOR) 20 MG tablet Take 5 mg by mouth every evening.       . testosterone enanthate (DELATESTRYL) 200 MG/ML injection 200 mg. Inject 200mg  every 7 days      . DISCONTD: ipratropium (ATROVENT) 0.03 % nasal spray Place 2 sprays into the nose 4 (four) times daily. As needed  30 mL  1    Allergies: Allergies  Allergen Reactions  . Codeine Other (See Comments)    Extremity tingling-- can take synthetic    History  Substance Use Topics  . Smoking status: Never Smoker   . Smokeless tobacco: Never Used  . Alcohol Use: Yes     OCCASIONAL     ROS:  Please see the history of present illness.   All other systems reviewed and negative.   PHYSICAL EXAM: VS:  BP 106/76  Pulse 51  Ht 5\' 10"  (1.778 m)  Wt 229 lb (103.874 kg)  BMI 32.86 kg/m2 Well nourished, well developed, in no acute distress HEENT: normal Neck: no JVD Vascular:  No carotid bruits Cardiac:  normal S1, S2; RRR; no murmur Lungs:  clear to auscultation bilaterally, no wheezing, rhonchi or rales Abd: soft, nontender, no hepatomegaly Ext: no edema Skin: warm and dry Neuro:  CNs 2-12 intact, no focal abnormalities noted  EKG:  Sinus rhythm, heart rate 51, left axis deviation, TW inversions in 3, aVF, subtle TW inversions V4-5    ASSESSMENT AND PLAN:  1.  Chest Pain   -  Somewhat atypical.  But symptoms remind him on prior angina.   -  Refill NTG Rx.   -  Recommend proceeding with cardiac cath.  D/w Dr. [name] who agreed.   -  Risks and benefits of cardiac catheterization have been discussed with the patient.  These include bleeding, infection, kidney damage, stroke, heart  attack, death.  The patient understands these risks and is willing to proceed.    -  Will arrange LHC with Dr. [name] next week in outpatient lab.  2.  CAD s/p CABG   -  Continue ASA and statin and proceed with cath as noted.  3.  Chronic Diastolic CHF   -  Does  not appear volume overloaded.   -  Check BNP.  Continue current dose of lasix.   4.  Hypertension   -  Controlled.  Continue current therapy.   5.  Hyperlipidemia  Lab Results  Component Value Date   CHOL 143 06/29/2011   HDL 53.00 06/29/2011   LDLCALC 70 06/29/2011   TRIG 98.0 06/29/2011   CHOLHDL 3 06/29/2011      -  Continue current dose of statin.   6.  Osteoarthritis    -  Needs surgery.   -  Will proceed with cardiac cath before making determination regarding cardiac clearance.     Danton Sewer, PA-C  11:32 AM 01/01/2012

## 2012-01-01 NOTE — H&P (Signed)
History and Physical  Date:  01/01/2012   Name:  [name]   DOB:  10/26/1945   MRN:  [id number]  PCP:  [name], MD, MD  Primary Cardiologist:  Dr. [name]  Primary Electrophysiologist:  None    History of Present Illness: [name] is a 66 y.o. male who returns for surgical clearance.  He has a history of CAD s/p CABG and obesity s/p lap banding procedure. Patient had seen Dr. Olevia Perches in the past and established with Dr. [name] 06/2011.  His bypass surgery was in 1995 and has had prior PCI to the diagonal.  LHC 05/2010: stent in Dx patent, dLAD 90% in Dx branch, pCFX occluded, oOM1 90%, pRCA occluded, S-PDA ok, S-OM ok with 40% in distal portion of graft, EF 55%.  The patient had arthroscopic knee surgery after seeing Dr. [name] that went ok.  He now needs right knee TKR.  This is scheduled in 04/2012.  Patient has noted problems with increased fatigue, DOE and intermittent CP over the last month.  CP radiates to his back.  Cannot really relate to activity.  No orthopnea, PND, edema.  Decreased activity over last several mos with 30 lb weight gain due to knee problems.  Overall, dyspnea and CP seems similar to prior angina. Has not tried any NTG.     Wt Readings from Last 3 Encounters:  01/01/12 229 lb (103.874 kg)  07/21/11 200 lb (90.719 kg)  07/21/11 200 lb (90.719 kg)     Potassium  Date/Time Value Range Status  07/22/2011 12:17 PM 4.4  3.5-5.1 (mEq/L) Final     Creatinine, Ser  Date/Time Value Range Status  06/29/2011 10:20 AM 1.1  0.4-1.5 (mg/dL) Final     ALT  Date/Time Value Range Status  06/29/2011 10:20 AM 16  0-53 (U/L) Final     Hemoglobin  Date/Time Value Range Status  07/22/2011 12:17 PM 15.0  13.0-17.0 (g/dL) Final    Past Medical History: 1. OSA: on CPAP.  2. Diabetes mellitus  3. Allergic rhinitis  4. CAD: s/p CABG 1995. LHC (10/11) with 90% distal D1 (patent proximal D1 stent), total occlusion CFX, total occlusion RCA,  no significant stenoses in LAD, SVG-PDA patent, SVG-OM patent, EF 55%. Medical management.  5. Obesity: lap-banding in 5/11.  6. Diastolic CHF: Echo (99991111) with EF 55-60%.  7. TIA: Carotid dopplers in 11/11 with 0-39% bilateral stenosis.  8. OA  9. HTN  10. Hyperlipidemia  11. Diverticulosis  Past Surgical History  Procedure Date  . Right knee ed compartment replacement 2004  . Laparoscopic gastric banding 01-15-09  . Knee arthroscopy w/ meniscectomy X2 IN 2002-- RIGHT KNEE  . Umbilical hernia repair [id number]    W/ GASTRIC BANDING PROCEDURE  . Coronary artery bypass graft 1995    X3 VESSEL  . Coronary angioplasty 1996-- POST CABG    W/ STENT  . Blepharoplasty 1985    BILATERAL  . Circumcision 35 YRS  AGO  . Knee arthroscopy 07/22/2011    Procedure: ARTHROSCOPY KNEE;  Surgeon: [name];  Location: [place];  Service: Orthopedics;  Laterality: Right;  WITH DEBRIDEMENT   . Chondroplasty 07/22/2011    Procedure: CHONDROPLASTY;  Surgeon: [name];  Location: [place];  Service: Orthopedics;;  . Synovectomy 07/22/2011    Procedure: SYNOVECTOMY;  Surgeon: Dione Plover Aluisio;  Location: [place];  Service: Orthopedics;;      Current Outpatient Prescriptions  Medication  Sig Dispense Refill  . aspirin 81 MG tablet Take 81 mg by mouth daily.       . carvedilol (COREG) 25 MG tablet Take 12.5 mg by mouth 2 (two) times daily with a meal. Take 1/2 tablet twice a day      . cyclobenzaprine (FLEXERIL) 10 MG tablet Take 10 mg by mouth 3 (three) times daily as needed.       . ergocalciferol (VITAMIN D2) 50000 UNITS capsule Take 50,000 Units by mouth. Take one tablet every two weeks.      . furosemide (LASIX) 20 MG tablet Take 20 mg by mouth daily.       Marland Kitchen HYDROcodone-acetaminophen (VICODIN) 5-500 MG per tablet Take 1 tablet by mouth every 6 (six) hours as needed.       Marland Kitchen ipratropium (ATROVENT) 0.03 % nasal spray Place 2 sprays  into the nose 4 (four) times daily as needed. As needed       . lansoprazole (PREVACID) 30 MG capsule Take 15 mg by mouth as needed.       . metFORMIN (GLUCOPHAGE) 1000 MG tablet Take 500 mg by mouth 2 (two) times daily with a meal.       . rosuvastatin (CRESTOR) 20 MG tablet Take 5 mg by mouth every evening.       . testosterone enanthate (DELATESTRYL) 200 MG/ML injection 200 mg. Inject 200mg  every 7 days      . DISCONTD: ipratropium (ATROVENT) 0.03 % nasal spray Place 2 sprays into the nose 4 (four) times daily. As needed  30 mL  1    Allergies: Allergies  Allergen Reactions  . Codeine Other (See Comments)    Extremity tingling-- can take synthetic    History  Substance Use Topics  . Smoking status: Never Smoker   . Smokeless tobacco: Never Used  . Alcohol Use: Yes     OCCASIONAL     ROS:  Please see the history of present illness.   All other systems reviewed and negative.   PHYSICAL EXAM: VS:  BP 106/76  Pulse 51  Ht 5\' 10"  (1.778 m)  Wt 229 lb (103.874 kg)  BMI 32.86 kg/m2 Well nourished, well developed, in no acute distress HEENT: normal Neck: no JVD Vascular:  No carotid bruits Cardiac:  normal S1, S2; RRR; no murmur Lungs:  clear to auscultation bilaterally, no wheezing, rhonchi or rales Abd: soft, nontender, no hepatomegaly Ext: no edema Skin: warm and dry Neuro:  CNs 2-12 intact, no focal abnormalities noted  EKG:  Sinus rhythm, heart rate 51, left axis deviation, TW inversions in 3, aVF, subtle TW inversions V4-5    ASSESSMENT AND PLAN:  1.  Chest Pain   -  Somewhat atypical.  But symptoms remind him of prior angina.   -  Refill NTG Rx.   -  Recommend proceeding with cardiac cath.  D/w Dr. [name] who agreed.   -  Risks and benefits of cardiac catheterization have been discussed with the patient.  These include bleeding, infection, kidney damage, stroke, heart attack, death.  The patient understands these risks and is willing to proceed.    -  Will  arrange LHC with Dr. [name] next week in outpatient lab.  2.  CAD s/p CABG   -  Continue ASA and statin and proceed with cath as noted.  3.  Chronic Diastolic CHF   -  Does not appear volume overloaded.   -  Check BNP.  Continue current dose  of lasix.   4.  Hypertension   -  Controlled.  Continue current therapy.   5.  Hyperlipidemia  Lab Results  Component Value Date   CHOL 143 06/29/2011   HDL 53.00 06/29/2011   LDLCALC 70 06/29/2011   TRIG 98.0 06/29/2011   CHOLHDL 3 06/29/2011      -  Continue current dose of statin.   6.  Osteoarthritis    -  Needs surgery.   -  Will proceed with cardiac cath before making determination regarding cardiac clearance.     Danton Sewer, PA-C  11:32 AM 01/01/2012

## 2012-01-07 ENCOUNTER — Inpatient Hospital Stay (HOSPITAL_BASED_OUTPATIENT_CLINIC_OR_DEPARTMENT_OTHER)
Admission: RE | Admit: 2012-01-07 | Discharge: 2012-01-07 | Disposition: A | Discharge status: Home / Self Care | Payer: Medicare Other | Source: Ambulatory Visit | Attending: Cardiology | Admitting: Cardiology

## 2012-01-07 ENCOUNTER — Encounter (HOSPITAL_BASED_OUTPATIENT_CLINIC_OR_DEPARTMENT_OTHER)
Admission: RE | Disposition: A | Discharge status: Home / Self Care | Payer: Self-pay | Source: Ambulatory Visit | Attending: Cardiology

## 2012-01-07 DIAGNOSIS — R079 Chest pain, unspecified: Secondary | ICD-10-CM | POA: Diagnosis not present

## 2012-01-07 DIAGNOSIS — I10 Essential (primary) hypertension: Secondary | ICD-10-CM | POA: Insufficient documentation

## 2012-01-07 DIAGNOSIS — Z9884 Bariatric surgery status: Secondary | ICD-10-CM | POA: Insufficient documentation

## 2012-01-07 DIAGNOSIS — G4733 Obstructive sleep apnea (adult) (pediatric): Secondary | ICD-10-CM | POA: Insufficient documentation

## 2012-01-07 DIAGNOSIS — R0989 Other specified symptoms and signs involving the circulatory and respiratory systems: Secondary | ICD-10-CM | POA: Insufficient documentation

## 2012-01-07 DIAGNOSIS — M1990 Unspecified osteoarthritis, unspecified site: Secondary | ICD-10-CM | POA: Insufficient documentation

## 2012-01-07 DIAGNOSIS — I5020 Unspecified systolic (congestive) heart failure: Secondary | ICD-10-CM | POA: Insufficient documentation

## 2012-01-07 DIAGNOSIS — I509 Heart failure, unspecified: Secondary | ICD-10-CM | POA: Insufficient documentation

## 2012-01-07 DIAGNOSIS — E785 Hyperlipidemia, unspecified: Secondary | ICD-10-CM | POA: Insufficient documentation

## 2012-01-07 DIAGNOSIS — I2582 Chronic total occlusion of coronary artery: Secondary | ICD-10-CM | POA: Diagnosis not present

## 2012-01-07 DIAGNOSIS — J309 Allergic rhinitis, unspecified: Secondary | ICD-10-CM | POA: Insufficient documentation

## 2012-01-07 DIAGNOSIS — Z9861 Coronary angioplasty status: Secondary | ICD-10-CM | POA: Insufficient documentation

## 2012-01-07 DIAGNOSIS — I2510 Atherosclerotic heart disease of native coronary artery without angina pectoris: Secondary | ICD-10-CM | POA: Insufficient documentation

## 2012-01-07 DIAGNOSIS — R0609 Other forms of dyspnea: Secondary | ICD-10-CM | POA: Insufficient documentation

## 2012-01-07 DIAGNOSIS — E119 Type 2 diabetes mellitus without complications: Secondary | ICD-10-CM | POA: Insufficient documentation

## 2012-01-07 SURGERY — JV LEFT HEART CATHETERIZATION WITH CORONARY ANGIOGRAM
Anesthesia: Moderate Sedation

## 2012-01-07 MED ORDER — SODIUM CHLORIDE 0.9 % IV SOLN
INTRAVENOUS | Status: DC
  Administered 2012-01-07: 13:00:00 via INTRAVENOUS

## 2012-01-07 MED ORDER — ONDANSETRON HCL 4 MG/2ML IJ SOLN: 4.0000 mg | Freq: Four times a day (QID) | INTRAMUSCULAR | Status: DC | PRN

## 2012-01-07 MED ORDER — SODIUM CHLORIDE 0.9 % IJ SOLN: 3.0000 mL | INTRAMUSCULAR | Status: DC | PRN

## 2012-01-07 MED ORDER — SODIUM CHLORIDE 0.9 % IV SOLN: INTRAVENOUS | Status: AC

## 2012-01-07 MED ORDER — SODIUM CHLORIDE 0.9 % IJ SOLN: 3.0000 mL | Freq: Two times a day (BID) | INTRAMUSCULAR | Status: DC

## 2012-01-07 MED ORDER — ASPIRIN 81 MG PO CHEW
324.0000 mg | CHEWABLE_TABLET | ORAL | Status: AC
  Administered 2012-01-07: 324 mg via ORAL

## 2012-01-07 MED ORDER — SODIUM CHLORIDE 0.9 % IV SOLN: 250.0000 mL | INTRAVENOUS | Status: DC | PRN

## 2012-01-07 MED ORDER — ACETAMINOPHEN 325 MG PO TABS: 650.0000 mg | ORAL_TABLET | ORAL | Status: DC | PRN

## 2012-01-07 NOTE — CV Procedure (Signed)
   Cardiac Catheterization Procedure Note  Name: [name] MRN: [id number] DOB: 1945-08-29  Procedure: Left Heart Cath, Selective Coronary Angiography, SVG angiography, LV angiography  Indication: Chest pain, exertional dyspnea, known CAD.    Procedural details: The right groin was prepped, draped, and anesthetized with 1% lidocaine. Using modified Seldinger technique, a 5 French sheath was introduced into the right femoral artery. MP catheter and JR4 catheter were used for coronary angiography and left ventriculography. Catheter exchanges were performed over a guidewire. There were no immediate procedural complications. The patient was transferred to the post catheterization recovery area for further monitoring.  Procedural Findings: Hemodynamics:  AO 136/66 LV 137/21   Coronary angiography: Coronary dominance: right  Left mainstem: 20% distal left main stenosis.   Left anterior descending (LAD): The LAD gives off a moderate, branching 1st diagonal that is comparable in size to the continuation of the LAD.  The LAD beyond D1 has no significant disease but is small in caliber.  D1 has a stent just the take-off of a proximal branch.  There is 75% stenosis in the proximal portion of the stent.  Beyond the stent in the distal D1, there is a 90% stenosis.  The vessel is small caliber at this point.   Left circumflex (LCx): Totally occluded proximally. SVG-OM is patent with 40% distal graft stenosis.  OM is patent after touchdown.   Right coronary artery (RCA): Total occlusion of the proximal RCA. SVG-PDA is patent.  The PDA is patent after the touchdown and the graft backfills into a PLV.   Left ventriculography: Left ventricular systolic function is normal, LVEF is estimated at 55%.  Basal inferior hypokinesis.   Final Conclusions:  No change compared to prior cath in 2011.  The grafts are patent.  There is significant disease in a moderate diagonal but it is not amenable to  intervention (90% distal diagonal stenosis, small caliber at that point).   Recommendations: Continue medical management.   [name] 01/07/2012, 2:39 PM

## 2012-01-07 NOTE — OR Nursing (Signed)
Meal served 

## 2012-01-07 NOTE — Discharge Instructions (Signed)
Followup with Dr. Aundra Dubin in 2 wks.

## 2012-01-07 NOTE — OR Nursing (Signed)
Dr McLean at bedside to discuss results and treatment plan with pt and family 

## 2012-01-07 NOTE — Interval H&P Note (Signed)
History and Physical Interval Note:  01/07/2012 2:05 PM  [name]  has presented today for surgery, with the diagnosis of cp  The various methods of treatment have been discussed with the patient and family. After consideration of risks, benefits and other options for treatment, the patient has consented to  Procedure(s) (LRB): JV LEFT HEART CATHETERIZATION WITH CORONARY ANGIOGRAM (N/A) as a surgical intervention .  The patients' history has been reviewed, patient examined, no change in status, stable for surgery.  I have reviewed the patients' chart and labs.  Questions were answered to the patient's satisfaction.     Bleu Minerd [employer]

## 2012-01-07 NOTE — OR Nursing (Signed)
Tegaderm dressing applied, site level 0, bedrest begins at 1455

## 2012-01-08 ENCOUNTER — Other Ambulatory Visit: Payer: Self-pay | Admitting: Dermatology

## 2012-01-08 DIAGNOSIS — L570 Actinic keratosis: Secondary | ICD-10-CM | POA: Diagnosis not present

## 2012-01-08 DIAGNOSIS — L821 Other seborrheic keratosis: Secondary | ICD-10-CM | POA: Diagnosis not present

## 2012-01-08 DIAGNOSIS — L819 Disorder of pigmentation, unspecified: Secondary | ICD-10-CM | POA: Diagnosis not present

## 2012-01-22 ENCOUNTER — Ambulatory Visit (INDEPENDENT_AMBULATORY_CARE_PROVIDER_SITE_OTHER): Payer: Medicare Other | Admitting: Nurse Practitioner

## 2012-01-22 ENCOUNTER — Encounter: Payer: Self-pay | Admitting: Nurse Practitioner

## 2012-01-22 VITALS — BP 115/75 | HR 62 | Ht 70.0 in | Wt 233.0 lb

## 2012-01-22 DIAGNOSIS — I208 Other forms of angina pectoris: Secondary | ICD-10-CM

## 2012-01-22 DIAGNOSIS — I209 Angina pectoris, unspecified: Secondary | ICD-10-CM

## 2012-01-22 DIAGNOSIS — I2510 Atherosclerotic heart disease of native coronary artery without angina pectoris: Secondary | ICD-10-CM | POA: Diagnosis not present

## 2012-01-22 MED ORDER — ISOSORBIDE MONONITRATE ER 30 MG PO TB24: 30.0000 mg | ORAL_TABLET | Freq: Every day | ORAL | Status: DC

## 2012-01-22 NOTE — Progress Notes (Signed)
Patient Name: [name] Date of Encounter: 01/22/2012  Primary Care Provider:  [name], MD, MD Primary Cardiologist:  Einar Crow, MD  Patient Profile  66 y/o male with h/o CAD who presents for f/u after recent cath.  Problem List   Past Medical History  Diagnosis Date  . Diastolic CHF, chronic     a. EF 55-60% by echo 2007  . Hypertension   . Coronary artery disease     a. s/p MI & CABG; b. s/p PCI Diag;  c. Cath 12/2011: LAD diff dzs/small, Diag 75% isr, 90 dist to stent (small), LCX & RCA occluded, VG->OM 40, VG->PDA patent - med rx.  . History of transient ischemic attack (TIA)     CAROTID DOPPLERS NOV 2011  0-39& BIL. STENOSIS  . Hyperlipidemia   . Allergic rhinitis   . History of diverticulitis of colon     5 YRS AGO  . Sleep apnea     a. on cpap  . Diabetes mellitus     ORAL MEDS  . OA (osteoarthritis)     RIGHT KNEE ARTHOFIBROSIS W/ PAIN  (S/P  REPLACEMENT 2004)   Past Surgical History  Procedure Date  . Right knee ed compartment replacement 2004  . Laparoscopic gastric banding 01-15-09  . Knee arthroscopy w/ meniscectomy X2 IN 2002-- RIGHT KNEE  . Umbilical hernia repair [id number]    W/ GASTRIC BANDING PROCEDURE  . Coronary artery bypass graft 1995    X3 VESSEL  . Coronary angioplasty 1996-- POST CABG    W/ STENT  . Blepharoplasty 1985    BILATERAL  . Circumcision 35 YRS  AGO  . Knee arthroscopy 07/22/2011    Procedure: ARTHROSCOPY KNEE;  Surgeon: [name];  Location: [place];  Service: Orthopedics;  Laterality: Right;  WITH DEBRIDEMENT   . Chondroplasty 07/22/2011    Procedure: CHONDROPLASTY;  Surgeon: [name];  Location: [place];  Service: Orthopedics;;  . Synovectomy 07/22/2011    Procedure: SYNOVECTOMY;  Surgeon: Dione Plover Aluisio;  Location: [place];  Service: Orthopedics;;    Allergies  Allergies  Allergen Reactions  . Codeine Other (See Comments)    Extremity  tingling-- can take synthetic    HPI  66 y/o male with the above problem list.  He recently underwent cath 2/2 recurrent chest pain and dyspnea, which showed stable anatomy.  He has diagonal dzs but this is a small vessel.  Since his cath, he has cont to have exertional c/p and dyspnea.  He says he knows his limits and when to stop.  He has not had c/p @ rest.  He denies pnd, orthopnea, n, v, dizziness, syncope, edema, or early satiety.  Home Medications  Prior to Admission medications   Medication Sig Start Date End Date Taking? Authorizing Provider  aspirin 81 MG tablet Take 81 mg by mouth daily.    Yes Historical Provider, MD  carvedilol (COREG) 25 MG tablet Take 12.5 mg by mouth 2 (two) times daily with a meal. Take 1/2 tablet twice a day 06/29/11  Yes [name], MD  cyclobenzaprine (FLEXERIL) 10 MG tablet Take 10 mg by mouth 3 (three) times daily as needed.    Yes Historical Provider, MD  ergocalciferol (VITAMIN D2) 50000 UNITS capsule Take 50,000 Units by mouth. Take one tablet every two weeks.   Yes Historical Provider, MD  furosemide (LASIX) 40 MG tablet Take 40 mg by mouth daily.   Yes  Historical Provider, MD  HYDROcodone-acetaminophen (VICODIN) 5-500 MG per tablet Take 1 tablet by mouth every 6 (six) hours as needed.    Yes Historical Provider, MD  ipratropium (ATROVENT) 0.03 % nasal spray Place 2 sprays into the nose 4 (four) times daily as needed. As needed  05/12/11 05/11/12 Yes Clinton D Young, MD  KLOR-CON M20 20 MEQ tablet Take 1 tablet by mouth daily. 01/08/12  Yes Historical Provider, MD  lansoprazole (PREVACID) 30 MG capsule Take 15 mg by mouth as needed.    Yes Historical Provider, MD  metFORMIN (GLUCOPHAGE) 1000 MG tablet Take 500 mg by mouth 2 (two) times daily with a meal.    Yes Historical Provider, MD  nitroGLYCERIN (NITROSTAT) 0.4 MG SL tablet Place 1 tablet (0.4 mg total) under the tongue every 5 (five) minutes as needed for chest pain. 01/01/12 12/31/12 Yes Liliane Shi, PA  rosuvastatin (CRESTOR) 20 MG tablet Take 5 mg by mouth every evening.    Yes Historical Provider, MD  testosterone (ANDROGEL) 50 MG/5GM GEL daily.   Yes Historical Provider, MD  isosorbide mononitrate (IMDUR) 30 MG 24 hr tablet Take 1 tablet (30 mg total) by mouth daily. 01/22/12 01/21/13  Rogelia Mire, NP   Review of Systems Exertional chest pain and dyspnea as above. All other systems reviewed and are otherwise negative except as noted above.  Physical Exam  Blood pressure 115/75, pulse 62, height 5\' 10"  (1.778 m), weight 233 lb (105.688 kg).  General: Pleasant, NAD Psych: Normal affect. Neuro: Alert and oriented X 3. Moves all extremities spontaneously. HEENT: Normal  Neck: Supple without bruits or JVD. Lungs:  Resp regular and unlabored, CTA. Heart: RRR no s3, s4, or murmurs. Abdomen: Soft, non-tender, non-distended, BS + x 4.  Extremities: No clubbing, cyanosis or edema. DP/PT/Radials 2+ and equal bilaterally.  Groin catheter site without bleeding, bruit, or hematoma.  Assessment & Plan  1.  Stable angina/coronary artery disease:  A. Recent and underwent diagnostic catheterization showing stable anatomy.  He continues to have exertional chest pain dyspnea.  I will initiate Imdur 30 mg daily.  Counseled him regarding usage of long-acting nitrates and contraindication for usage of erectile dysfunction medications she says he sometimes uses.  Continue aspirin, beta blocker, statin therapy.  2.  Hypertension:  Stable.  3.  Hyperlipidemia:  Continue statin therapy.  4.  Diabetes mellitus:  Per internal medicine.  5.  Disposition:  Followup with Dr. [name] in about a month.  Murray Hodgkins, NP 01/22/2012, 5:28 PM

## 2012-01-22 NOTE — Patient Instructions (Signed)
Your physician recommends that you schedule a follow-up appointment in: 1 month with Dr Aundra Dubin Your physician has recommended you make the following change in your medication: START Isosorbide 30 mg daily

## 2012-01-28 ENCOUNTER — Ambulatory Visit (INDEPENDENT_AMBULATORY_CARE_PROVIDER_SITE_OTHER): Payer: Medicare Other | Admitting: Physician Assistant

## 2012-01-28 ENCOUNTER — Encounter (INDEPENDENT_AMBULATORY_CARE_PROVIDER_SITE_OTHER): Payer: Self-pay

## 2012-01-28 VITALS — BP 116/84 | Ht 69.0 in | Wt 234.6 lb

## 2012-01-28 DIAGNOSIS — Z4651 Encounter for fitting and adjustment of gastric lap band: Secondary | ICD-10-CM | POA: Diagnosis not present

## 2012-01-28 NOTE — Progress Notes (Signed)
  HISTORY: [name] is a 66 y.o.male who received an AP-Large lap-band in May 2010 by Dr. Excell Seltzer. He comes in having been seen last in December 2011 and has gained 4 lbs. He is just one pound shy of his pre-op weight. He is persistently hungry and is eating large portions of food. He is also making poor food choices, turning often to 'junk' food. He's having difficulty exercising secondary to right knee pain. He's scheduled for surgery this coming September.  VITAL SIGNS: Filed Vitals:   01/28/12 0953  BP: 116/84    PHYSICAL EXAM: Physical exam reveals a very well-appearing 66 y.o.male in no apparent distress Neurologic: Awake, alert, oriented Psych: Bright affect, conversant Respiratory: Breathing even and unlabored. No stridor or wheezing Abdomen: Soft, nontender, nondistended to palpation. Incisions well-healed. No incisional hernias. Port easily palpated. Extremities: Atraumatic, good range of motion.  ASSESMENT: 66 y.o.  male  s/p AP-Large lap-band.   PLAN: The patient's port was accessed with a 20G Huber needle without difficulty. Clear fluid was aspirated and 1 mL saline was added to the port to give a total predicted volume of 8.5 mL. The patient was able to swallow water without difficulty following the procedure and was instructed to take clear liquids for the next 24-48 hours and advance slowly as tolerated.

## 2012-01-28 NOTE — Patient Instructions (Signed)
Take clear liquids tonight. Thin protein shakes are ok to start tomorrow morning. Slowly advance your diet thereafter. Call us if you have persistent vomiting or regurgitation, night cough or reflux symptoms. Return as scheduled or sooner if you notice no changes in hunger/portion sizes.  

## 2012-02-11 ENCOUNTER — Encounter (INDEPENDENT_AMBULATORY_CARE_PROVIDER_SITE_OTHER): Payer: Medicare Other

## 2012-02-24 ENCOUNTER — Ambulatory Visit: Payer: Medicare Other | Admitting: Cardiology

## 2012-03-30 ENCOUNTER — Encounter: Payer: Self-pay | Admitting: Cardiology

## 2012-03-30 ENCOUNTER — Ambulatory Visit (INDEPENDENT_AMBULATORY_CARE_PROVIDER_SITE_OTHER): Payer: Medicare Other | Admitting: Cardiology

## 2012-03-30 VITALS — BP 132/76 | HR 60 | Ht 69.0 in | Wt 230.5 lb

## 2012-03-30 DIAGNOSIS — E785 Hyperlipidemia, unspecified: Secondary | ICD-10-CM

## 2012-03-30 DIAGNOSIS — I25810 Atherosclerosis of coronary artery bypass graft(s) without angina pectoris: Secondary | ICD-10-CM

## 2012-03-30 DIAGNOSIS — Z01818 Encounter for other preprocedural examination: Secondary | ICD-10-CM | POA: Diagnosis not present

## 2012-03-30 NOTE — Patient Instructions (Signed)
Your physician wants you to follow-up in: 6 months with Dr Aundra Dubin. (February 2014). You will receive a reminder letter in the mail two months in advance. If you don't receive a letter, please call our office to schedule the follow-up appointment.

## 2012-03-31 DIAGNOSIS — Z01818 Encounter for other preprocedural examination: Secondary | ICD-10-CM | POA: Insufficient documentation

## 2012-03-31 NOTE — Assessment & Plan Note (Signed)
Recent cath with stable anatomy.  Currently, no ischemic symptoms.  I think he is of acceptable risk to undergo TKR in 9/13.  He should continue his Coreg peri-operatively.

## 2012-03-31 NOTE — Progress Notes (Signed)
Patient ID: [name], male   DOB: 1946-06-15, 66 y.o.   MRN: [id number] PCP: Dr Harrington Challenger  66 yo with history of CAD s/p CABG and obesity s/p lap banding procedure presents for cardiology followup.  During the Spring, he developed increased exertional symptoms.  I did a left heart cath on him in 5/13.  This showed patent grafts with a 90% distal D1 stenosis that was not amenable to intervention.    Since that time he has done well.  No chest pain.  He can walk 3-5 miles at a time on flat ground without dyspnea.  He is short of breath walking up a flight of stairs or up a hill.  He needs a right TKR.  This is planned for 9/13.    Labs (11/12): LDL 70 Labs (5/13): K 4.1, creatinine 1.1, BNP 48  PMH: 1. OSA: on CPAP.  2. Diabetes mellitus 3. Allergic rhinitis 4. CAD: s/p CABG 1995.  LHC (10/11) with 90% distal D1 (patent proximal D1 stent), total occlusion CFX, total occlusion RCA, no significant stenoses in LAD, SVG-PDA patent, SVG-OM patent, EF 55%.  Medical management.  LHC (5/13) with patent grafts and 90% distal D1 stenosis (no significant change from 10/11).  5. Obesity: lap-banding in 5/11.  6. Diastolic CHF: Echo (99991111) with EF 55-60%.   7. TIA: Carotid dopplers in 11/11 with 0-39% bilateral stenosis.  8. OA: Knees, C-spine 9. HTN 10. Hyperlipidemia 11. 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  Lives in [place].   FH: Noncontributory.    Current Outpatient Prescriptions  Medication Sig Dispense Refill  . aspirin 81 MG tablet Take 81 mg by mouth daily.       . carvedilol (COREG) 25 MG tablet Take 12.5 mg by mouth 2 (two) times daily with a meal. Take 1/2 tablet twice a day      . cyclobenzaprine (FLEXERIL) 10 MG tablet Take 10 mg by mouth 3 (three) times daily as needed.       . ergocalciferol (VITAMIN D2) 50000 UNITS capsule Take 50,000 Units by mouth. Take one tablet every two weeks.      . furosemide (LASIX) 40 MG tablet Take 40 mg by  mouth daily.      Marland Kitchen HYDROcodone-acetaminophen (VICODIN) 5-500 MG per tablet Take 1 tablet by mouth every 6 (six) hours as needed.       Marland Kitchen ipratropium (ATROVENT) 0.03 % nasal spray Place 2 sprays into the nose 4 (four) times daily as needed. As needed       . KLOR-CON M20 20 MEQ tablet Take 1 tablet by mouth daily.      . lansoprazole (PREVACID) 30 MG capsule Take 15 mg by mouth as needed.       . metFORMIN (GLUCOPHAGE) 1000 MG tablet Take 500 mg by mouth 2 (two) times daily with a meal.       . nitroGLYCERIN (NITROSTAT) 0.4 MG SL tablet Place 1 tablet (0.4 mg total) under the tongue every 5 (five) minutes as needed for chest pain.  25 tablet  3  . rosuvastatin (CRESTOR) 20 MG tablet Take 5 mg by mouth every evening.       . testosterone (ANDROGEL) 50 MG/5GM GEL daily.      Marland Kitchen DISCONTD: ipratropium (ATROVENT) 0.03 % nasal spray Place 2 sprays into the nose 4 (four) times daily. As needed  30 mL  1    BP 132/76  Pulse 60  Ht 5\' 9"  (  1.753 m)  Wt 230 lb 8 oz (104.554 kg)  BMI 34.04 kg/m2 General: NAD Neck: No JVD, no thyromegaly or thyroid nodule.  Lungs: Clear to auscultation bilaterally with normal respiratory effort. CV: Nondisplaced PMI.  Heart regular S1/S2, no S3/S4, no murmur.  No peripheral edema.  No carotid bruit.  Normal pedal pulses.  Abdomen: Soft, nontender, no hepatosplenomegaly, no distention.  Neurologic: Alert and oriented x 3.  Psych: Normal affect. Extremities: No clubbing or cyanosis.

## 2012-03-31 NOTE — Assessment & Plan Note (Signed)
No recent exertional symptoms.  Grafts patent on 5/13 cath.  90% distal D1 stenosis was seen on prior cath as well and is not amenable to intervention.  Continue ASA 81, statin, Coreg.

## 2012-03-31 NOTE — Assessment & Plan Note (Signed)
Goal LDL < 70.  He will have labs done by PCP in about a week.  I asked him to forward a copy to [place].

## 2012-04-06 DIAGNOSIS — N529 Male erectile dysfunction, unspecified: Secondary | ICD-10-CM | POA: Diagnosis not present

## 2012-04-06 DIAGNOSIS — I2510 Atherosclerotic heart disease of native coronary artery without angina pectoris: Secondary | ICD-10-CM | POA: Diagnosis not present

## 2012-04-06 DIAGNOSIS — E7800 Pure hypercholesterolemia, unspecified: Secondary | ICD-10-CM | POA: Diagnosis not present

## 2012-04-06 DIAGNOSIS — Z79899 Other long term (current) drug therapy: Secondary | ICD-10-CM | POA: Diagnosis not present

## 2012-04-06 DIAGNOSIS — I10 Essential (primary) hypertension: Secondary | ICD-10-CM | POA: Diagnosis not present

## 2012-04-06 DIAGNOSIS — E291 Testicular hypofunction: Secondary | ICD-10-CM | POA: Diagnosis not present

## 2012-04-14 ENCOUNTER — Encounter (HOSPITAL_COMMUNITY): Payer: Self-pay | Admitting: Pharmacy Technician

## 2012-04-18 ENCOUNTER — Other Ambulatory Visit: Payer: Self-pay | Admitting: Orthopedic Surgery

## 2012-04-18 MED ORDER — DEXAMETHASONE SODIUM PHOSPHATE 10 MG/ML IJ SOLN: 10.0000 mg | Freq: Once | INTRAMUSCULAR | Status: DC

## 2012-04-18 MED ORDER — BUPIVACAINE 0.25 % ON-Q PUMP SINGLE CATH 300ML: 300.0000 mL | INJECTION | Status: DC

## 2012-04-18 NOTE — Progress Notes (Signed)
Preoperative surgical orders have been place into the Epic hospital system for [name] on 04/18/2012, 8:08 AM  by Mickel Crow for surgery on 04/27/2012.  Preop Total Knee orders including Bupivacaine On-Q pump, IV Tylenol, and IV Decadron as long as there are no contraindications to the above medications. Arlee Muslim, PA-C  These orders were originally entered on 12/31/11 but due to a default 90 day rule, the orders were deleted automatically by the EPIC system requiring them to be reentered again. Arlee Muslim, PA-C

## 2012-04-19 NOTE — Patient Instructions (Signed)
[place]  04/19/2012   Your procedure is scheduled on:  04/27/12 0830am-1000am  Report to [facility] at [place] AM.  Call this number if you have problems the morning of surgery: [phone]   Remember:   Do not eat food:After Midnight.  May have clear liquids:until Midnight .    Take these medicines the morning of surgery with A SIP OF WATER   Do not wear jewelry,   Do not wear lotions, powders, or perfumes.    Men may shave face and neck.  Do not bring valuables to the hospital.  Contacts, dentures or bridgework may not be worn into surgery.  Leave suitcase in the car. After surgery it may be brought to your room.  For patients admitted to the hospital, checkout time is 11:00 AM the day of discharge.    Special Instructions: CHG Shower Use Special Wash: 1/2 bottle night before surgery and 1/2 bottle morning of surgery. shower chin to toes with CHG. Wash face and private parts with regular soap.    Please read over the following fact sheets that you were given: MRSA Information, coughing and deep breathing exercises, leg exercises, Blood Transfusion Fact Sheet, Incentive Spirometry Fact Sheet

## 2012-04-20 ENCOUNTER — Ambulatory Visit (HOSPITAL_COMMUNITY)
Admission: RE | Admit: 2012-04-20 | Discharge: 2012-04-20 | Disposition: A | Discharge status: Home / Self Care | Payer: Medicare Other | Source: Ambulatory Visit | Attending: Orthopedic Surgery | Admitting: Orthopedic Surgery

## 2012-04-20 ENCOUNTER — Encounter (HOSPITAL_COMMUNITY): Payer: Self-pay

## 2012-04-20 ENCOUNTER — Encounter (HOSPITAL_COMMUNITY)
Admission: RE | Admit: 2012-04-20 | Discharge: 2012-04-20 | Disposition: A | Discharge status: Home / Self Care | Payer: Medicare Other | Source: Ambulatory Visit | Attending: Orthopedic Surgery | Admitting: Orthopedic Surgery

## 2012-04-20 DIAGNOSIS — Z01818 Encounter for other preprocedural examination: Secondary | ICD-10-CM | POA: Diagnosis not present

## 2012-04-20 DIAGNOSIS — Z96659 Presence of unspecified artificial knee joint: Secondary | ICD-10-CM | POA: Insufficient documentation

## 2012-04-20 DIAGNOSIS — T84099A Other mechanical complication of unspecified internal joint prosthesis, initial encounter: Secondary | ICD-10-CM | POA: Insufficient documentation

## 2012-04-20 DIAGNOSIS — Y831 Surgical operation with implant of artificial internal device as the cause of abnormal reaction of the patient, or of later complication, without mention of misadventure at the time of the procedure: Secondary | ICD-10-CM | POA: Insufficient documentation

## 2012-04-20 HISTORY — DX: Myoneural disorder, unspecified: G70.9

## 2012-04-20 HISTORY — DX: Anxiety disorder, unspecified: F41.9

## 2012-04-20 HISTORY — DX: Gastro-esophageal reflux disease without esophagitis: K21.9

## 2012-04-20 HISTORY — DX: Malignant (primary) neoplasm, unspecified: C80.1

## 2012-04-20 HISTORY — DX: Depression, unspecified: F32.A

## 2012-04-20 HISTORY — DX: Major depressive disorder, single episode, unspecified: F32.9

## 2012-04-20 LAB — COMPREHENSIVE METABOLIC PANEL
AST: 16 U/L (ref 0–37)
Albumin: 4.3 g/dL (ref 3.5–5.2)
BUN: 16 mg/dL (ref 6–23)
Calcium: 10 mg/dL (ref 8.4–10.5)
Creatinine, Ser: 1.11 mg/dL (ref 0.50–1.35)
Total Bilirubin: 0.5 mg/dL (ref 0.3–1.2)

## 2012-04-20 LAB — URINALYSIS, ROUTINE W REFLEX MICROSCOPIC
Bilirubin Urine: NEGATIVE
Ketones, ur: NEGATIVE mg/dL
Leukocytes, UA: NEGATIVE
Nitrite: NEGATIVE
Specific Gravity, Urine: 1.016 (ref 1.005–1.030)
Urobilinogen, UA: 0.2 mg/dL (ref 0.0–1.0)
pH: 5.5 (ref 5.0–8.0)

## 2012-04-20 LAB — CBC
HCT: 43.3 % (ref 39.0–52.0)
MCH: 31.5 pg (ref 26.0–34.0)
MCHC: 35.3 g/dL (ref 30.0–36.0)
MCV: 89.3 fL (ref 78.0–100.0)
Platelets: 162 10*3/uL (ref 150–400)
RDW: 12.4 % (ref 11.5–15.5)

## 2012-04-20 LAB — SURGICAL PCR SCREEN: Staphylococcus aureus: POSITIVE — AB

## 2012-04-20 LAB — PROTIME-INR
INR: 0.99 (ref 0.00–1.49)
Prothrombin Time: 13.3 seconds (ref 11.6–15.2)

## 2012-04-20 NOTE — Progress Notes (Signed)
Last office visit - DR Aundra Dubin 03/30/12 in [facility]  2007 ECHO in [facility] [address]  01/07/12 CATH in [facility]

## 2012-04-21 ENCOUNTER — Encounter (INDEPENDENT_AMBULATORY_CARE_PROVIDER_SITE_OTHER): Payer: Medicare Other

## 2012-04-26 ENCOUNTER — Other Ambulatory Visit: Payer: Self-pay | Admitting: Orthopedic Surgery

## 2012-04-26 MED ORDER — SODIUM CHLORIDE 0.9 % IV SOLN: INTRAVENOUS | Status: DC

## 2012-04-26 MED ORDER — DEXTROSE 5 % IV SOLN
3.0000 g | INTRAVENOUS | Status: AC
  Administered 2012-04-27: 2 g via INTRAVENOUS
  Filled 2012-04-26: qty 3000

## 2012-04-26 NOTE — H&P (Signed)
[name]  DOB: Mar 09, 1946 Married / Language: English / Race: White Male  Date of Admission:  04/27/2012  Chief Complaint: Right Knee Pain  History of Present Illness The patient is a 66 year old male who comes in for a preoperative History and Physical. The patient is scheduled for a right revision uni to total knee repalcement to be performed by Dr. Dione Plover. Aluisio, MD at [facility] on 04/27/2012. He reports that he is currently taking hydrocodone for pain but only needs it once every very days depending on his activity level. He is experiencing mild swelling, but states that applying ice every night has helped with that. He is still wearing the hinge knee brace and states that he feels that it is what is limiting his symptoms. They have been treated conservatively in the past for the above stated problem and despite conservative measures, they continue to have progressive pain and severe functional limitations and dysfunction. They have failed non-operative management. It is felt that they would benefit from undergoing total joint replacement. Risks and benefits of the procedure have been discussed with the patient and they elect to proceed with surgery. There are no active contraindications to surgery such as ongoing infection or rapidly progressive neurological disease.   Allergies Codeine. 08/12/2001 Tingling sensation (please note that he can take the synthetic codeines)   Family History Cerebrovascular Accident. father Congestive Heart Failure. sister and brother Depression. father Diabetes Mellitus. sister Heart Disease. father, sister and brother Heart disease in male family member before age 29 Heart disease in male family member before age 26 Hypertension. father, sister and brother   Social History Alcohol use. Occasional alcohol use. current drinker; drinks beer, wine and hard liquor; only occasionally per week Children. 1 Current work  status. working full time Drug/Alcohol Rehab (Currently). no Exercise. Exercises daily; does running / walking, individual sport and gym / weights Illicit drug use. no Living situation. live with spouse Marital status. 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 no Tobacco / smoke exposure. no Tobacco use. Never smoker. never smoker Post-Surgical Plans. Plan is for home. Advance Directives. Living Will, Healthcare POA   Medication History Testosterone Enanthate (200MG /ML Oil, Intramuscular) Active. Aspirin Adult Low Strength (81MG  Tablet DR, Oral) Active. Furosemide (20MG  Tablet, Oral) Active. Carvedilol (25MG  Tablet, Oral) Active. Lansoprazole (30MG  Capsule DR, Oral) Active. MetFORMIN HCl (1000MG  Tablet, Oral) Active. Rosuvastatin Calcium (20MG  Tablet, Oral) Active. Vitamin D ( Oral) Specific dose unknown - Active. Potassium Chloride CR (20MEQ Tablet ER, Oral) Active. Isosorbide Mononitrate (30MG  Tablet ER 24HR, Oral) Active. Fluticasone Propionate (50MCG/ACT Suspension, Nasal) Active. Hydrocodone-Acetaminophen (10-500MG  Tablet, Oral) Active. Cyclobenzaprine HCl (10MG  Tablet, Oral) Active. Zaleplon (10MG  Capsule, Oral) Active. Nitrostat (0.4MG  Tab Sublingual, Sublingual) Active. ZyrTEC Allergy (10MG  Tablet, Oral) Active.   Past Surgical History Arthroscopy of Knee. right Coronary Artery Bypass Graft. 3 vessels Heart Stents Other Surgery. Partial right knee replacement  Past Medical History Congestive Heart Failure Coronary artery disease Diabetes Mellitus, Type II Diverticulitis Of Colon High blood pressure Hypercholesterolemia Myocardial infarction Osteoarthritis Sleep Apnea. uses CPAP   Review of Systems General:Not Present- Chills, Fever, Night Sweats, Appetite Loss, Fatigue, Feeling sick, Weight Gain and Weight Loss. Skin:Not Present- Itching, Rash, Skin Color  Changes, Ulcer, Psoriasis and Change in Hair or Nails. HEENT:Not Present- Sensitivity to light, Nose Bleed, Visual Loss, Decreased Hearing and Ringing in the Ears. Neck:Not Present- Swollen Glands and Neck Mass. Respiratory:Not Present- Shortness  of breath, Snoring, Chronic Cough and Bloody sputum. Cardiovascular:Not Present- Shortness of Breath, Chest Pain, Swelling of Extremities, Leg Cramps and Palpitations. Gastrointestinal:Not Present- Bloody Stool, Heartburn, Abdominal Pain, Vomiting, Nausea and Incontinence of Stool. Male Genitourinary:Not Present- Blood in Urine, Frequency, Incontinence and Nocturia. Musculoskeletal:Present- Joint Pain and Morning Stiffness. Not Present- Muscle Weakness, Muscle Pain, Joint Stiffness, Joint Swelling and Back Pain. Neurological:Not Present- Tingling, Numbness, Burning, Tremor, Headaches and Dizziness. Psychiatric:Not Present- Anxiety, Depression and Memory Loss. Endocrine:Not Present- Cold Intolerance, Heat Intolerance, Excessive hunger and Excessive Thirst. Hematology:Not Present- Abnormal Bleeding, Abnormal bruising, Anemia and Blood Clots.   Vitals 04/04/2012 2:34 PM Pulse: 56 (Regular) Resp.: 12 (Unlabored) BP: 122/78 (Sitting, Left Arm, Standard)  Physical Exam The physical exam findings are as follows:   General Mental Status - Alert, cooperative and good historian. General Appearance- pleasant. Not in acute distress. Orientation- Oriented X3. Build & Nutrition- Well nourished and Well developed.   Head and Neck Head- normocephalic, atraumatic . Neck Global Assessment- supple. no bruit auscultated on the right and no bruit auscultated on the left.   Eye Pupil- Bilateral- Regular and Round. Motion- Bilateral- EOMI. wears glasses  Chest and Lung Exam Auscultation: Breath sounds:- clear at anterior chest wall and - clear at posterior chest wall. Adventitious sounds:- No Adventitious  sounds.   Cardiovascular Auscultation:Rhythm- Regular rate and rhythm. Heart Sounds- S1 WNL and S2 WNL. Murmurs & Other Heart Sounds:Auscultation of the heart reveals - No Murmurs.   Abdomen Palpation/Percussion:Tenderness- Abdomen is non-tender to palpation. Rigidity (guarding)- Abdomen is soft. Auscultation:Auscultation of the abdomen reveals - Bowel sounds normal.   Male Genitourinary Not done, not pertinent to present illness  Musculoskeletal On physical exam a well developed male, alert and oriented in no apparent distress. His right knee shows no effusion. His range is about 5 to 120. There is no tenderness or instability.  Assessment & Plan Failed right uni knee  Patient is for a revision right unit knee to a right total knee repalcement by Dr. Wynelle Link.  Plan is to go home  PCP - Dr. Myriam Jacobson Cards - Dr. [name] CVTS - Dr. Gilford Raid Gen. Surgery - Dr. Excell Seltzer Pulm - Dr. Keturah Barre NS - Dr. Glenna Fellows  Signed electronically by Jeneen Montgomery, PA-C

## 2012-04-26 NOTE — Anesthesia Preprocedure Evaluation (Addendum)
Anesthesia Evaluation  Patient identified by MRN, date of birth, ID band Patient awake    Reviewed: Allergy & Precautions, H&P , NPO status , Patient's Chart, lab work & pertinent test results, reviewed documented beta blocker date and time   Airway Mallampati: III TM Distance: >3 FB Neck ROM: full    Dental  (+) Caps and Dental Advisory Given,    Pulmonary sleep apnea and Continuous Positive Airway Pressure Ventilation ,  breath sounds clear to auscultation  Pulmonary exam normal       Cardiovascular hypertension, Pt. on medications and Pt. on home beta blockers + CAD, + Past MI, + CABG and +CHF Rhythm:regular Rate:Normal  5/13 cath showed 2 grafts occluded and 90% block in small distal artery.  EF 55% in 2007.  Diastolic dysfunction and CHF.   Neuro/Psych Anxiety Depression Left arm numbness. negative neurological ROS  negative psych ROS   GI/Hepatic negative GI ROS, Neg liver ROS, GERD-  Medicated and Controlled,  Endo/Other  Well Controlled, Type 2, Oral Hypoglycemic Agents  Renal/GU negative Renal ROS  negative genitourinary   Musculoskeletal   Abdominal   Peds  Hematology negative hematology ROS (+)   Anesthesia Other Findings   Reproductive/Obstetrics negative OB ROS                        Anesthesia Physical Anesthesia Plan  ASA: III  Anesthesia Plan: Spinal   Post-op Pain Management:    Induction:   Airway Management Planned:   Additional Equipment:   Intra-op Plan:   Post-operative Plan:   Informed Consent: I have reviewed the patients History and Physical, chart, labs and discussed the procedure including the risks, benefits and alternatives for the proposed anesthesia with the patient or authorized representative who has indicated his/her understanding and acceptance.   Dental Advisory Given  Plan Discussed with: Surgeon  Anesthesia Plan Comments:         Anesthesia Quick Evaluation

## 2012-04-27 ENCOUNTER — Ambulatory Visit (HOSPITAL_COMMUNITY): Payer: Medicare Other | Admitting: Anesthesiology

## 2012-04-27 ENCOUNTER — Encounter (HOSPITAL_COMMUNITY): Payer: Self-pay | Admitting: Anesthesiology

## 2012-04-27 ENCOUNTER — Encounter (HOSPITAL_COMMUNITY)
Admission: RE | Disposition: A | Discharge status: Home Health | Payer: Self-pay | Source: Ambulatory Visit | Attending: Orthopedic Surgery

## 2012-04-27 ENCOUNTER — Inpatient Hospital Stay (HOSPITAL_COMMUNITY)
Admission: RE | Admit: 2012-04-27 | Discharge: 2012-04-30 | DRG: 467 | Disposition: A | Discharge status: Home Health | Payer: Medicare Other | Source: Ambulatory Visit | Attending: Orthopedic Surgery | Admitting: Orthopedic Surgery

## 2012-04-27 ENCOUNTER — Encounter (HOSPITAL_COMMUNITY): Payer: Self-pay | Admitting: *Deleted

## 2012-04-27 DIAGNOSIS — Z951 Presence of aortocoronary bypass graft: Secondary | ICD-10-CM | POA: Diagnosis not present

## 2012-04-27 DIAGNOSIS — F3289 Other specified depressive episodes: Secondary | ICD-10-CM | POA: Diagnosis present

## 2012-04-27 DIAGNOSIS — I5032 Chronic diastolic (congestive) heart failure: Secondary | ICD-10-CM | POA: Diagnosis not present

## 2012-04-27 DIAGNOSIS — I509 Heart failure, unspecified: Secondary | ICD-10-CM | POA: Diagnosis present

## 2012-04-27 DIAGNOSIS — I2510 Atherosclerotic heart disease of native coronary artery without angina pectoris: Secondary | ICD-10-CM | POA: Diagnosis present

## 2012-04-27 DIAGNOSIS — IMO0002 Reserved for concepts with insufficient information to code with codable children: Secondary | ICD-10-CM | POA: Diagnosis not present

## 2012-04-27 DIAGNOSIS — F411 Generalized anxiety disorder: Secondary | ICD-10-CM | POA: Diagnosis present

## 2012-04-27 DIAGNOSIS — T84498A Other mechanical complication of other internal orthopedic devices, implants and grafts, initial encounter: Secondary | ICD-10-CM | POA: Diagnosis not present

## 2012-04-27 DIAGNOSIS — T84099A Other mechanical complication of unspecified internal joint prosthesis, initial encounter: Principal | ICD-10-CM | POA: Diagnosis present

## 2012-04-27 DIAGNOSIS — D649 Anemia, unspecified: Secondary | ICD-10-CM | POA: Diagnosis not present

## 2012-04-27 DIAGNOSIS — R079 Chest pain, unspecified: Secondary | ICD-10-CM

## 2012-04-27 DIAGNOSIS — I252 Old myocardial infarction: Secondary | ICD-10-CM

## 2012-04-27 DIAGNOSIS — M1710 Unilateral primary osteoarthritis, unspecified knee: Secondary | ICD-10-CM | POA: Diagnosis present

## 2012-04-27 DIAGNOSIS — Z96659 Presence of unspecified artificial knee joint: Secondary | ICD-10-CM | POA: Diagnosis not present

## 2012-04-27 DIAGNOSIS — K219 Gastro-esophageal reflux disease without esophagitis: Secondary | ICD-10-CM | POA: Diagnosis present

## 2012-04-27 DIAGNOSIS — T8484XA Pain due to internal orthopedic prosthetic devices, implants and grafts, initial encounter: Secondary | ICD-10-CM

## 2012-04-27 DIAGNOSIS — Y831 Surgical operation with implant of artificial internal device as the cause of abnormal reaction of the patient, or of later complication, without mention of misadventure at the time of the procedure: Secondary | ICD-10-CM | POA: Diagnosis present

## 2012-04-27 DIAGNOSIS — I10 Essential (primary) hypertension: Secondary | ICD-10-CM | POA: Diagnosis present

## 2012-04-27 DIAGNOSIS — E119 Type 2 diabetes mellitus without complications: Secondary | ICD-10-CM | POA: Diagnosis present

## 2012-04-27 DIAGNOSIS — G4730 Sleep apnea, unspecified: Secondary | ICD-10-CM | POA: Diagnosis present

## 2012-04-27 DIAGNOSIS — E7800 Pure hypercholesterolemia, unspecified: Secondary | ICD-10-CM

## 2012-04-27 DIAGNOSIS — F329 Major depressive disorder, single episode, unspecified: Secondary | ICD-10-CM | POA: Diagnosis present

## 2012-04-27 DIAGNOSIS — T84069A Wear of articular bearing surface of unspecified internal prosthetic joint, initial encounter: Secondary | ICD-10-CM | POA: Diagnosis not present

## 2012-04-27 LAB — GLUCOSE, CAPILLARY
Glucose-Capillary: 154 mg/dL — ABNORMAL HIGH (ref 70–99)
Glucose-Capillary: 132 mg/dL — ABNORMAL HIGH (ref 70–99)
Glucose-Capillary: 136 mg/dL — ABNORMAL HIGH (ref 70–99)
Glucose-Capillary: 170 mg/dL — ABNORMAL HIGH (ref 70–99)

## 2012-04-27 LAB — ABO/RH: ABO/RH(D): O POS

## 2012-04-27 LAB — TYPE AND SCREEN

## 2012-04-27 SURGERY — CONVERSION, ARTHROPLASTY, KNEE, PARTIAL, TO TOTAL KNEE ARTHROPLASTY
Anesthesia: General | Site: Knee | Laterality: Right | Wound class: Clean

## 2012-04-27 MED ORDER — BISACODYL 10 MG RE SUPP: 10.0000 mg | Freq: Every day | RECTAL | Status: DC | PRN

## 2012-04-27 MED ORDER — METOCLOPRAMIDE HCL 10 MG PO TABS: 5.0000 mg | ORAL_TABLET | Freq: Three times a day (TID) | ORAL | Status: DC | PRN

## 2012-04-27 MED ORDER — MORPHINE SULFATE 2 MG/ML IJ SOLN
INTRAMUSCULAR | Status: AC
  Filled 2012-04-27: qty 1

## 2012-04-27 MED ORDER — CHLORHEXIDINE GLUCONATE 4 % EX LIQD: 60.0000 mL | Freq: Once | CUTANEOUS | Status: DC

## 2012-04-27 MED ORDER — ACETAMINOPHEN 10 MG/ML IV SOLN
INTRAVENOUS | Status: AC
  Filled 2012-04-27: qty 100

## 2012-04-27 MED ORDER — OXYCODONE HCL 5 MG PO TABS
5.0000 mg | ORAL_TABLET | ORAL | Status: DC | PRN
  Administered 2012-04-27 – 2012-04-29 (×11): 10 mg via ORAL
  Administered 2012-04-29: 5 mg via ORAL
  Administered 2012-04-30: 10 mg via ORAL
  Filled 2012-04-27: qty 2
  Filled 2012-04-27: qty 1
  Filled 2012-04-27: qty 2
  Filled 2012-04-27: qty 1
  Filled 2012-04-27 (×7): qty 2
  Filled 2012-04-27: qty 1
  Filled 2012-04-27 (×5): qty 2

## 2012-04-27 MED ORDER — FLUTICASONE PROPIONATE 50 MCG/ACT NA SUSP
1.0000 | Freq: Every day | NASAL | Status: DC
  Filled 2012-04-27: qty 16

## 2012-04-27 MED ORDER — FUROSEMIDE 40 MG PO TABS
40.0000 mg | ORAL_TABLET | Freq: Every day | ORAL | Status: DC
  Administered 2012-04-27 – 2012-04-30 (×4): 40 mg via ORAL
  Filled 2012-04-27 (×4): qty 1

## 2012-04-27 MED ORDER — ACETAMINOPHEN 325 MG PO TABS: 650.0000 mg | ORAL_TABLET | Freq: Four times a day (QID) | ORAL | Status: DC | PRN

## 2012-04-27 MED ORDER — CARVEDILOL 12.5 MG PO TABS
12.5000 mg | ORAL_TABLET | Freq: Two times a day (BID) | ORAL | Status: DC
  Administered 2012-04-27 – 2012-04-30 (×6): 12.5 mg via ORAL
  Filled 2012-04-27 (×8): qty 1

## 2012-04-27 MED ORDER — TESTOSTERONE 20.25 MG/ACT (1.62%) TD GEL: 2.0000 | Freq: Every day | TRANSDERMAL | Status: DC

## 2012-04-27 MED ORDER — PHENYLEPHRINE HCL 10 MG/ML IJ SOLN
10.0000 mg | INTRAMUSCULAR | Status: DC | PRN
  Administered 2012-04-27: 40 ug/min via INTRAVENOUS

## 2012-04-27 MED ORDER — LACTATED RINGERS IV SOLN: INTRAVENOUS | Status: DC

## 2012-04-27 MED ORDER — ZOLPIDEM TARTRATE 10 MG PO TABS: 10.0000 mg | ORAL_TABLET | Freq: Every evening | ORAL | Status: DC | PRN

## 2012-04-27 MED ORDER — IPRATROPIUM BROMIDE 0.03 % NA SOLN
2.0000 | Freq: Four times a day (QID) | NASAL | Status: DC | PRN
  Filled 2012-04-27: qty 30

## 2012-04-27 MED ORDER — LORATADINE 10 MG PO TABS
10.0000 mg | ORAL_TABLET | Freq: Every day | ORAL | Status: DC | PRN
  Filled 2012-04-27: qty 1

## 2012-04-27 MED ORDER — ACETAMINOPHEN 10 MG/ML IV SOLN: 1000.0000 mg | Freq: Once | INTRAVENOUS | Status: DC

## 2012-04-27 MED ORDER — ACETAMINOPHEN 10 MG/ML IV SOLN
1000.0000 mg | Freq: Four times a day (QID) | INTRAVENOUS | Status: AC
  Administered 2012-04-27 – 2012-04-28 (×4): 1000 mg via INTRAVENOUS
  Filled 2012-04-27 (×6): qty 100

## 2012-04-27 MED ORDER — METOCLOPRAMIDE HCL 5 MG/ML IJ SOLN: 5.0000 mg | Freq: Three times a day (TID) | INTRAMUSCULAR | Status: DC | PRN

## 2012-04-27 MED ORDER — ONDANSETRON HCL 4 MG PO TABS: 4.0000 mg | ORAL_TABLET | Freq: Four times a day (QID) | ORAL | Status: DC | PRN

## 2012-04-27 MED ORDER — ATORVASTATIN CALCIUM 10 MG PO TABS
10.0000 mg | ORAL_TABLET | Freq: Every day | ORAL | Status: DC
  Administered 2012-04-27 – 2012-04-29 (×3): 10 mg via ORAL
  Filled 2012-04-27 (×4): qty 1

## 2012-04-27 MED ORDER — PHENOL 1.4 % MT LIQD: 1.0000 | OROMUCOSAL | Status: DC | PRN

## 2012-04-27 MED ORDER — INSULIN ASPART 100 UNIT/ML ~~LOC~~ SOLN
0.0000 [IU] | Freq: Three times a day (TID) | SUBCUTANEOUS | Status: DC
  Administered 2012-04-28 (×3): 2 [IU] via SUBCUTANEOUS
  Administered 2012-04-29 – 2012-04-30 (×3): 3 [IU] via SUBCUTANEOUS

## 2012-04-27 MED ORDER — PROPOFOL INFUSION 10 MG/ML OPTIME
INTRAVENOUS | Status: DC | PRN
  Administered 2012-04-27: 25 ug/kg/min via INTRAVENOUS

## 2012-04-27 MED ORDER — HYDROMORPHONE HCL PF 1 MG/ML IJ SOLN: 0.2500 mg | INTRAMUSCULAR | Status: DC | PRN

## 2012-04-27 MED ORDER — MORPHINE SULFATE 2 MG/ML IJ SOLN
1.0000 mg | INTRAMUSCULAR | Status: DC | PRN
  Administered 2012-04-27 (×4): 2 mg via INTRAVENOUS
  Filled 2012-04-27 (×3): qty 1

## 2012-04-27 MED ORDER — RIVAROXABAN 10 MG PO TABS
10.0000 mg | ORAL_TABLET | Freq: Every day | ORAL | Status: DC
  Administered 2012-04-28 – 2012-04-30 (×3): 10 mg via ORAL
  Filled 2012-04-27 (×4): qty 1

## 2012-04-27 MED ORDER — LACTATED RINGERS IV SOLN
INTRAVENOUS | Status: DC | PRN
  Administered 2012-04-27 (×3): via INTRAVENOUS

## 2012-04-27 MED ORDER — METFORMIN HCL 500 MG PO TABS
500.0000 mg | ORAL_TABLET | Freq: Two times a day (BID) | ORAL | Status: DC
  Administered 2012-04-27: 500 mg via ORAL
  Filled 2012-04-27 (×4): qty 1

## 2012-04-27 MED ORDER — CEFAZOLIN SODIUM-DEXTROSE 2-3 GM-% IV SOLR
INTRAVENOUS | Status: AC
  Filled 2012-04-27: qty 50

## 2012-04-27 MED ORDER — PANTOPRAZOLE SODIUM 20 MG PO TBEC
20.0000 mg | DELAYED_RELEASE_TABLET | Freq: Every day | ORAL | Status: DC
  Filled 2012-04-27 (×2): qty 1

## 2012-04-27 MED ORDER — BUPIVACAINE HCL 0.75 % IJ SOLN
INTRAMUSCULAR | Status: DC | PRN
  Administered 2012-04-27: 15 mg

## 2012-04-27 MED ORDER — FLEET ENEMA 7-19 GM/118ML RE ENEM: 1.0000 | ENEMA | Freq: Once | RECTAL | Status: AC | PRN

## 2012-04-27 MED ORDER — TRAMADOL HCL 50 MG PO TABS: 50.0000 mg | ORAL_TABLET | Freq: Four times a day (QID) | ORAL | Status: DC | PRN

## 2012-04-27 MED ORDER — FENTANYL CITRATE 0.05 MG/ML IJ SOLN
INTRAMUSCULAR | Status: DC | PRN
  Administered 2012-04-27: 25 ug via INTRAVENOUS

## 2012-04-27 MED ORDER — CEFAZOLIN SODIUM 1-5 GM-% IV SOLN
1.0000 g | Freq: Four times a day (QID) | INTRAVENOUS | Status: AC
  Administered 2012-04-27 (×2): 1 g via INTRAVENOUS
  Filled 2012-04-27 (×2): qty 50

## 2012-04-27 MED ORDER — MIDAZOLAM HCL 5 MG/5ML IJ SOLN
INTRAMUSCULAR | Status: DC | PRN
  Administered 2012-04-27: 2 mg via INTRAVENOUS

## 2012-04-27 MED ORDER — ONDANSETRON HCL 4 MG/2ML IJ SOLN: 4.0000 mg | Freq: Four times a day (QID) | INTRAMUSCULAR | Status: DC | PRN

## 2012-04-27 MED ORDER — DOCUSATE SODIUM 100 MG PO CAPS
100.0000 mg | ORAL_CAPSULE | Freq: Two times a day (BID) | ORAL | Status: DC
  Administered 2012-04-27 – 2012-04-30 (×7): 100 mg via ORAL

## 2012-04-27 MED ORDER — POTASSIUM CHLORIDE CRYS ER 20 MEQ PO TBCR
20.0000 meq | EXTENDED_RELEASE_TABLET | Freq: Every day | ORAL | Status: DC
  Administered 2012-04-27 – 2012-04-30 (×4): 20 meq via ORAL
  Filled 2012-04-27 (×4): qty 1

## 2012-04-27 MED ORDER — BUPIVACAINE 0.25 % ON-Q PUMP SINGLE CATH 300ML
INJECTION | Status: DC | PRN
  Administered 2012-04-27: 300 mL

## 2012-04-27 MED ORDER — NITROGLYCERIN 0.4 MG SL SUBL: 0.4000 mg | SUBLINGUAL_TABLET | SUBLINGUAL | Status: DC | PRN

## 2012-04-27 MED ORDER — DIPHENHYDRAMINE HCL 12.5 MG/5ML PO ELIX: 12.5000 mg | ORAL_SOLUTION | ORAL | Status: DC | PRN

## 2012-04-27 MED ORDER — 0.9 % SODIUM CHLORIDE (POUR BTL) OPTIME
TOPICAL | Status: DC | PRN
  Administered 2012-04-27: 1000 mL

## 2012-04-27 MED ORDER — METHOCARBAMOL 500 MG PO TABS
500.0000 mg | ORAL_TABLET | Freq: Four times a day (QID) | ORAL | Status: DC | PRN
  Administered 2012-04-27 – 2012-04-29 (×4): 500 mg via ORAL
  Filled 2012-04-27 (×4): qty 1

## 2012-04-27 MED ORDER — SODIUM CHLORIDE 0.9 % IV SOLN
INTRAVENOUS | Status: DC
  Administered 2012-04-27 (×2): via INTRAVENOUS

## 2012-04-27 MED ORDER — BUPIVACAINE ON-Q PAIN PUMP (FOR ORDER SET NO CHG): INJECTION | Status: DC

## 2012-04-27 MED ORDER — METHOCARBAMOL 100 MG/ML IJ SOLN
500.0000 mg | Freq: Four times a day (QID) | INTRAVENOUS | Status: DC | PRN
  Administered 2012-04-27: 500 mg via INTRAVENOUS
  Filled 2012-04-27: qty 5

## 2012-04-27 MED ORDER — SODIUM CHLORIDE 0.9 % IR SOLN
Status: DC | PRN
  Administered 2012-04-27: 1000 mL

## 2012-04-27 MED ORDER — ACETAMINOPHEN 650 MG RE SUPP: 650.0000 mg | Freq: Four times a day (QID) | RECTAL | Status: DC | PRN

## 2012-04-27 MED ORDER — BUPIVACAINE 0.25 % ON-Q PUMP SINGLE CATH 300ML
INJECTION | Status: AC
  Filled 2012-04-27: qty 300

## 2012-04-27 MED ORDER — MENTHOL 3 MG MT LOZG: 1.0000 | LOZENGE | OROMUCOSAL | Status: DC | PRN

## 2012-04-27 MED ORDER — POLYETHYLENE GLYCOL 3350 17 G PO PACK: 17.0000 g | PACK | Freq: Every day | ORAL | Status: DC | PRN

## 2012-04-27 SURGICAL SUPPLY — 61 items
AUG TIB SZ5 5 REV STP WDG STRL (Knees) ×1 IMPLANT
BAG SPEC THK2 15X12 ZIP CLS (MISCELLANEOUS) ×1
BAG ZIPLOCK 12X15 (MISCELLANEOUS) ×2 IMPLANT
BANDAGE ELASTIC 6 VELCRO ST LF (GAUZE/BANDAGES/DRESSINGS) ×2 IMPLANT
BANDAGE ESMARK 6X9 LF (GAUZE/BANDAGES/DRESSINGS) ×1 IMPLANT
BLADE SAG 18X100X1.27 (BLADE) ×2 IMPLANT
BLADE SAW SGTL 11.0X1.19X90.0M (BLADE) ×2 IMPLANT
BNDG CMPR 9X6 STRL LF SNTH (GAUZE/BANDAGES/DRESSINGS) ×1
BNDG ESMARK 6X9 LF (GAUZE/BANDAGES/DRESSINGS) ×2
BOWL SMART MIX CTS (DISPOSABLE) ×1 IMPLANT
CATH KIT ON-Q SILVERSOAK 5 (CATHETERS) ×1 IMPLANT
CATH KIT ON-Q SILVERSOAK 5IN (CATHETERS) ×2 IMPLANT
CEMENT HV SMART SET (Cement) ×3 IMPLANT
CEMENT RESTRICTOR DEPUY SZ 4 (Cement) ×1 IMPLANT
CLOTH BEACON ORANGE TIMEOUT ST (SAFETY) ×2 IMPLANT
CLSR STERI-STRIP ANTIMIC 1/2X4 (GAUZE/BANDAGES/DRESSINGS) ×2 IMPLANT
CUFF TOURN SGL QUICK 34 (TOURNIQUET CUFF) ×2
CUFF TRNQT CYL 34X4X40X1 (TOURNIQUET CUFF) ×1 IMPLANT
DRAPE EXTREMITY T 121X128X90 (DRAPE) ×2 IMPLANT
DRAPE POUCH INSTRU U-SHP 10X18 (DRAPES) ×2 IMPLANT
DRAPE U-SHAPE 47X51 STRL (DRAPES) ×2 IMPLANT
DRSG ADAPTIC 3X8 NADH LF (GAUZE/BANDAGES/DRESSINGS) ×2 IMPLANT
DRSG PAD ABDOMINAL 8X10 ST (GAUZE/BANDAGES/DRESSINGS) ×1 IMPLANT
DURAPREP 26ML APPLICATOR (WOUND CARE) ×2 IMPLANT
ELECT REM PT RETURN 9FT ADLT (ELECTROSURGICAL) ×2
ELECTRODE REM PT RTRN 9FT ADLT (ELECTROSURGICAL) ×1 IMPLANT
EVACUATOR 1/8 PVC DRAIN (DRAIN) ×2 IMPLANT
FACESHIELD LNG OPTICON STERILE (SAFETY) ×10 IMPLANT
FEMUR SIGMA PS SZ 4.0 R (Femur) ×1 IMPLANT
GLOVE BIO SURGEON STRL SZ8 (GLOVE) ×2 IMPLANT
GLOVE BIOGEL PI IND STRL 8 (GLOVE) ×2 IMPLANT
GLOVE BIOGEL PI INDICATOR 8 (GLOVE) ×2
GLOVE ECLIPSE 8.0 STRL XLNG CF (GLOVE) ×2 IMPLANT
GOWN STRL NON-REIN LRG LVL3 (GOWN DISPOSABLE) ×4 IMPLANT
GOWN STRL REIN XL XLG (GOWN DISPOSABLE) ×2 IMPLANT
HANDPIECE INTERPULSE COAX TIP (DISPOSABLE) ×2
IMMOBILIZER KNEE 20 (SOFTGOODS) ×2
IMMOBILIZER KNEE 20 THIGH 36 (SOFTGOODS) ×1 IMPLANT
KIT BASIN OR (CUSTOM PROCEDURE TRAY) ×2 IMPLANT
MANIFOLD NEPTUNE II (INSTRUMENTS) ×2 IMPLANT
NS IRRIG 1000ML POUR BTL (IV SOLUTION) ×2 IMPLANT
PACK TOTAL JOINT (CUSTOM PROCEDURE TRAY) ×2 IMPLANT
PADDING CAST COTTON 6X4 STRL (CAST SUPPLIES) ×6 IMPLANT
PATELLA DOME PFC 41MM (Knees) ×1 IMPLANT
PLATE ROT INSERT 15MM SIZE 4 (Plate) ×1 IMPLANT
POSITIONER SURGICAL ARM (MISCELLANEOUS) ×2 IMPLANT
SET HNDPC FAN SPRY TIP SCT (DISPOSABLE) ×1 IMPLANT
SPONGE GAUZE 4X4 12PLY (GAUZE/BANDAGES/DRESSINGS) ×2 IMPLANT
STAPLER VISISTAT 35W (STAPLE) ×1 IMPLANT
STEM TIBIA PFC 13X30MM (Stem) ×1 IMPLANT
SUCTION FRAZIER 12FR DISP (SUCTIONS) ×2 IMPLANT
SUT MNCRL AB 4-0 PS2 18 (SUTURE) ×1 IMPLANT
SUT VIC AB 2-0 CT1 27 (SUTURE) ×6
SUT VIC AB 2-0 CT1 TAPERPNT 27 (SUTURE) ×3 IMPLANT
SUT VLOC 180 0 24IN GS25 (SUTURE) ×1 IMPLANT
TOWEL OR 17X26 10 PK STRL BLUE (TOWEL DISPOSABLE) ×4 IMPLANT
TRAY FOLEY CATH 14FRSI W/METER (CATHETERS) ×2 IMPLANT
TRAY TIB SZ 5 REVISION (Knees) ×1 IMPLANT
WATER STERILE IRR 1500ML POUR (IV SOLUTION) ×2 IMPLANT
WEDGE SZ 5 5MM (Knees) ×1 IMPLANT
WRAP KNEE MAXI GEL POST OP (GAUZE/BANDAGES/DRESSINGS) ×3 IMPLANT

## 2012-04-27 NOTE — Brief Op Note (Signed)
04/27/2012  10:09 AM  PATIENT:  Rich Brave  66 y.o. male  PRE-OPERATIVE DIAGNOSIS:  Failed right knee unicompartmental replacement  POST-OPERATIVE DIAGNOSIS:  Failed right knee unicompartmental replacement  PROCEDURE:  Procedure(s) (LRB) with comments: CONVERSION TO TOTAL KNEE (Right)  SURGEON:  Surgeon(s) and Role:    * [name], MD - Primary  PHYSICIAN ASSISTANT:   ASSISTANTS: Molli Barrows, PA-C   ANESTHESIA:   spinal  EBL:  Total I/O In: 2000 [I.V.:2000] Out: 300 [Urine:200; Blood:100]  BLOOD ADMINISTERED:none  DRAINS: (medium) Hemovact drain(s) in the right knee with  Suction Open   LOCAL MEDICATIONS USED:  OTHER On-Q Marcaine pain pump  COUNTS:  YES  TOURNIQUET:   Total Tourniquet Time Documented: Thigh (Right) - 54 minutes  DICTATION: .Other Dictation: Dictation Number [id number]  PLAN OF CARE: Admit to inpatient   PATIENT DISPOSITION:  PACU - hemodynamically stable.

## 2012-04-27 NOTE — Transfer of Care (Signed)
Immediate Anesthesia Transfer of Care Note  Patient: [name]  Procedure(s) Performed: Procedure(s) (LRB): CONVERSION TO TOTAL KNEE (Right)  Patient Location: PACU  Anesthesia Type: Regional  Level of Consciousness: sedated, patient cooperative and responds to stimulaton  Airway & Oxygen Therapy: Patient Spontanous Breathing and Patient connected to face mask oxgen  Post-op Assessment: Report given to PACU RN and Post -op Vital signs reviewed and stable  Post vital signs: Reviewed and stable  Complications: No apparent anesthesia complications [id number] level on exam and release to PACU staff.

## 2012-04-27 NOTE — H&P (View-Only) (Signed)
[name]  DOB: 01-May-1946 Married / Language: English / Race: White Male  Date of Admission:  04/27/2012  Chief Complaint: Right Knee Pain  History of Present Illness The patient is a 66 year old male who comes in for a preoperative History and Physical. The patient is scheduled for a right revision uni to total knee repalcement to be performed by Dr. Dione Plover. Aluisio, MD at [facility] on 04/27/2012. He reports that he is currently taking hydrocodone for pain but only needs it once every very days depending on his activity level. He is experiencing mild swelling, but states that applying ice every night has helped with that. He is still wearing the hinge knee brace and states that he feels that it is what is limiting his symptoms. They have been treated conservatively in the past for the above stated problem and despite conservative measures, they continue to have progressive pain and severe functional limitations and dysfunction. They have failed non-operative management. It is felt that they would benefit from undergoing total joint replacement. Risks and benefits of the procedure have been discussed with the patient and they elect to proceed with surgery. There are no active contraindications to surgery such as ongoing infection or rapidly progressive neurological disease.   Allergies Codeine. 08/12/2001 Tingling sensation (please note that he can take the synthetic codeines)   Family History Cerebrovascular Accident. father Congestive Heart Failure. sister and brother Depression. father Diabetes Mellitus. sister Heart Disease. father, sister and brother Heart disease in male family member before age 53 Heart disease in male family member before age 55 Hypertension. father, sister and brother   Social History Alcohol use. Occasional alcohol use. current drinker; drinks beer, wine and hard liquor; only occasionally per week Children. 1 Current work  status. working full time Drug/Alcohol Rehab (Currently). no Exercise. Exercises daily; does running / walking, individual sport and gym / weights Illicit drug use. no Living situation. live with spouse Marital status. 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 no Tobacco / smoke exposure. no Tobacco use. Never smoker. never smoker Post-Surgical Plans. Plan is for home. Advance Directives. Living Will, Healthcare POA   Medication History Testosterone Enanthate (200MG /ML Oil, Intramuscular) Active. Aspirin Adult Low Strength (81MG  Tablet DR, Oral) Active. Furosemide (20MG  Tablet, Oral) Active. Carvedilol (25MG  Tablet, Oral) Active. Lansoprazole (30MG  Capsule DR, Oral) Active. MetFORMIN HCl (1000MG  Tablet, Oral) Active. Rosuvastatin Calcium (20MG  Tablet, Oral) Active. Vitamin D ( Oral) Specific dose unknown - Active. Potassium Chloride CR (20MEQ Tablet ER, Oral) Active. Isosorbide Mononitrate (30MG  Tablet ER 24HR, Oral) Active. Fluticasone Propionate (50MCG/ACT Suspension, Nasal) Active. Hydrocodone-Acetaminophen (10-500MG  Tablet, Oral) Active. Cyclobenzaprine HCl (10MG  Tablet, Oral) Active. Zaleplon (10MG  Capsule, Oral) Active. Nitrostat (0.4MG  Tab Sublingual, Sublingual) Active. ZyrTEC Allergy (10MG  Tablet, Oral) Active.   Past Surgical History Arthroscopy of Knee. right Coronary Artery Bypass Graft. 3 vessels Heart Stents Other Surgery. Partial right knee replacement  Past Medical History Congestive Heart Failure Coronary artery disease Diabetes Mellitus, Type II Diverticulitis Of Colon High blood pressure Hypercholesterolemia Myocardial infarction Osteoarthritis Sleep Apnea. uses CPAP   Review of Systems General:Not Present- Chills, Fever, Night Sweats, Appetite Loss, Fatigue, Feeling sick, Weight Gain and Weight Loss. Skin:Not Present- Itching, Rash, Skin Color  Changes, Ulcer, Psoriasis and Change in Hair or Nails. HEENT:Not Present- Sensitivity to light, Nose Bleed, Visual Loss, Decreased Hearing and Ringing in the Ears. Neck:Not Present- Swollen Glands and Neck Mass. Respiratory:Not Present- Shortness  of breath, Snoring, Chronic Cough and Bloody sputum. Cardiovascular:Not Present- Shortness of Breath, Chest Pain, Swelling of Extremities, Leg Cramps and Palpitations. Gastrointestinal:Not Present- Bloody Stool, Heartburn, Abdominal Pain, Vomiting, Nausea and Incontinence of Stool. Male Genitourinary:Not Present- Blood in Urine, Frequency, Incontinence and Nocturia. Musculoskeletal:Present- Joint Pain and Morning Stiffness. Not Present- Muscle Weakness, Muscle Pain, Joint Stiffness, Joint Swelling and Back Pain. Neurological:Not Present- Tingling, Numbness, Burning, Tremor, Headaches and Dizziness. Psychiatric:Not Present- Anxiety, Depression and Memory Loss. Endocrine:Not Present- Cold Intolerance, Heat Intolerance, Excessive hunger and Excessive Thirst. Hematology:Not Present- Abnormal Bleeding, Abnormal bruising, Anemia and Blood Clots.   Vitals 04/04/2012 2:34 PM Pulse: 56 (Regular) Resp.: 12 (Unlabored) BP: 122/78 (Sitting, Left Arm, Standard)  Physical Exam The physical exam findings are as follows:   General Mental Status - Alert, cooperative and good historian. General Appearance- pleasant. Not in acute distress. Orientation- Oriented X3. Build & Nutrition- Well nourished and Well developed.   Head and Neck Head- normocephalic, atraumatic . Neck Global Assessment- supple. no bruit auscultated on the right and no bruit auscultated on the left.   Eye Pupil- Bilateral- Regular and Round. Motion- Bilateral- EOMI. wears glasses  Chest and Lung Exam Auscultation: Breath sounds:- clear at anterior chest wall and - clear at posterior chest wall. Adventitious sounds:- No Adventitious  sounds.   Cardiovascular Auscultation:Rhythm- Regular rate and rhythm. Heart Sounds- S1 WNL and S2 WNL. Murmurs & Other Heart Sounds:Auscultation of the heart reveals - No Murmurs.   Abdomen Palpation/Percussion:Tenderness- Abdomen is non-tender to palpation. Rigidity (guarding)- Abdomen is soft. Auscultation:Auscultation of the abdomen reveals - Bowel sounds normal.   Male Genitourinary Not done, not pertinent to present illness  Musculoskeletal On physical exam a well developed male, alert and oriented in no apparent distress. His right knee shows no effusion. His range is about 5 to 120. There is no tenderness or instability.  Assessment & Plan Failed right uni knee  Patient is for a revision right unit knee to a right total knee repalcement by Dr. Wynelle Link.  Plan is to go home  PCP - Dr. Myriam Jacobson Cards - Dr. [name] CVTS - Dr. Gilford Raid Gen. Surgery - Dr. Excell Seltzer Pulm - Dr. Keturah Barre NS - Dr. Glenna Fellows  Signed electronically by Jeneen Montgomery, PA-C

## 2012-04-27 NOTE — Progress Notes (Signed)
Utilization review completed.  

## 2012-04-27 NOTE — Anesthesia Postprocedure Evaluation (Signed)
  Anesthesia Post-op Note  Patient: [name]  Procedure(s) Performed: Procedure(s) (LRB): CONVERSION TO TOTAL KNEE (Right)  Patient Location: PACU  Anesthesia Type: Spinal  Level of Consciousness: awake and alert   Airway and Oxygen Therapy: Patient Spontanous Breathing  Post-op Pain: mild  Post-op Assessment: Post-op Vital signs reviewed, Patient's Cardiovascular Status Stable, Respiratory Function Stable, Patent Airway and No signs of Nausea or vomiting  Post-op Vital Signs: stable  Complications: No apparent anesthesia complications

## 2012-04-27 NOTE — Progress Notes (Signed)
Rt placed pt on CPAP per MD order. PT is on Auto with 2lpm bleed in O2. Pt doing well at this time.

## 2012-04-27 NOTE — Interval H&P Note (Signed)
History and Physical Interval Note:  04/27/2012 8:16 AM  [name]  has presented today for surgery, with the diagnosis of Failed right knee unicompartmental replacement  The various methods of treatment have been discussed with the patient and family. After consideration of risks, benefits and other options for treatment, the patient has consented to  Procedure(s) (LRB) with comments: CONVERSION TO TOTAL KNEE (Right) as a surgical intervention .  The patient's history has been reviewed, patient examined, no change in status, stable for surgery.  I have reviewed the patient's chart and labs.  Questions were answered to the patient's satisfaction.     [name]

## 2012-04-27 NOTE — Anesthesia Procedure Notes (Signed)
Spinal  Patient location during procedure: OR Start time: 04/27/2012 8:30 AM End time: 04/27/2012 8:35 AM Staffing CRNA/Resident: Anne Fu Preanesthetic Checklist Completed: patient identified, site marked, surgical consent, pre-op evaluation, timeout performed, IV checked, risks and benefits discussed and monitors and equipment checked Spinal Block Patient position: sitting Prep: Betadine Patient monitoring: heart rate, continuous pulse ox and blood pressure Location: L2-3 Injection technique: single-shot Needle Needle type: Spinocan  Needle gauge: 25 G Needle length: 9 cm Assessment Sensory level: T4 Additional Notes Expiration date of kit checked and confirmed. Patient tolerated procedure well, without complications. L2-L3 X 1 attempt with clear CSF return and easy aspiration and flush.

## 2012-04-28 LAB — CBC
MCH: 31.6 pg (ref 26.0–34.0)
MCV: 89.8 fL (ref 78.0–100.0)
Platelets: 136 10*3/uL — ABNORMAL LOW (ref 150–400)
RBC: 3.64 MIL/uL — ABNORMAL LOW (ref 4.22–5.81)

## 2012-04-28 LAB — BASIC METABOLIC PANEL
BUN: 12 mg/dL (ref 6–23)
CO2: 27 mEq/L (ref 19–32)
Calcium: 8.2 mg/dL — ABNORMAL LOW (ref 8.4–10.5)
Creatinine, Ser: 0.97 mg/dL (ref 0.50–1.35)

## 2012-04-28 LAB — GLUCOSE, CAPILLARY: Glucose-Capillary: 143 mg/dL — ABNORMAL HIGH (ref 70–99)

## 2012-04-28 MED ORDER — NON FORMULARY: 30.0000 mg | Freq: Every day | Status: DC

## 2012-04-28 MED ORDER — LANSOPRAZOLE 30 MG PO CPDR
30.0000 mg | DELAYED_RELEASE_CAPSULE | Freq: Every day | ORAL | Status: DC
  Administered 2012-04-29: 30 mg via ORAL
  Filled 2012-04-28 (×3): qty 1

## 2012-04-28 MED ORDER — SODIUM CHLORIDE 0.9 % IV SOLN
INTRAVENOUS | Status: DC
  Administered 2012-04-28 – 2012-04-29 (×2): via INTRAVENOUS

## 2012-04-28 MED ORDER — SODIUM CHLORIDE 0.9 % IV BOLUS (SEPSIS): 250.0000 mL | Freq: Once | INTRAVENOUS | Status: DC

## 2012-04-28 NOTE — Progress Notes (Signed)
   Subjective: 1 Day Post-Op Procedure(s) (LRB): CONVERSION TO TOTAL KNEE (Right) Patient reports pain as mild and moderate.   Patient seen in rounds with Dr. Wynelle Link. Patient is well, but has had some minor complaints of pain in the knee, requiring pain medications and some low heart rate through the night but was asymptomatic. We will start therapy today.  Plan is to go Home after hospital stay.  Objective: Vital signs in last 24 hours: Temp:  [97.4 F (36.3 C)-98.7 F (37.1 C)] 98.7 F (37.1 C) (09/05 0525) Pulse Rate:  [40-83] 56  (09/05 0525) Resp:  [12-20] 20  (09/05 0525) BP: (94-121)/(55-72) 94/58 mmHg (09/05 0525) SpO2:  [95 %-100 %] 95 % (09/05 0525) FiO2 (%):  [100 %] 100 % (09/04 1802) Weight:  [104.15 kg (229 lb 9.8 oz)] 104.15 kg (229 lb 9.8 oz) (09/04 1304)  Intake/Output from previous day:  Intake/Output Summary (Last 24 hours) at 04/28/12 0743 Last data filed at 04/28/12 0600  Gross per 24 hour  Intake 4883.33 ml  Output   3465 ml  Net 1418.33 ml    Intake/Output this shift: UOP 800 since MN  Labs:  [facility] 04/28/12 0350  HGB 11.5*    Basename 04/28/12 0350  WBC 8.1  RBC 3.64*  HCT 32.7*  PLT 136*    Basename 04/28/12 0350  NA 136  K 3.6  CL 102  CO2 27  BUN 12  CREATININE 0.97  GLUCOSE 134*  CALCIUM 8.2*   No results found for this basename: LABPT:2,INR:2 in the last 72 hours  EXAM General - Patient is Alert, Appropriate and Oriented Extremity - Neurovascular intact Sensation intact distally Dorsiflexion/Plantar flexion intact Dressing - moderate drainage from the hemovac site.  Changed dressing this morning.  No bleeding from the incision. Motor Function - intact, moving foot and toes well on exam.  Hemovac pulled without difficulty.  Past Medical History  Diagnosis Date  . Diastolic CHF, chronic     a. EF 55-60% by echo 2007  . Hypertension   . Coronary artery disease     a. s/p MI & CABG; b. s/p PCI Diag;  c. Cath  12/2011: LAD diff dzs/small, Diag 75% isr, 90 dist to stent (small), LCX & RCA occluded, VG->OM 40, VG->PDA patent - med rx.  . History of transient ischemic attack (TIA)     CAROTID DOPPLERS NOV 2011  0-39& BIL. STENOSIS  . Hyperlipidemia   . Allergic rhinitis   . History of diverticulitis of colon     5 YRS AGO  . Diabetes mellitus     ORAL MEDS  . 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 Anxiety   . Depression   . Neuromuscular disorder     left arm numbness   . GERD (gastroesophageal reflux disease)   . Cancer     hx of skin cancers   . Sleep apnea     cpap- see ov note in EPIC 05/12/11 for settings     Assessment/Plan: 1 Day Post-Op Procedure(s) (LRB): CONVERSION TO TOTAL KNEE (Right) Principal Problem:  *Pain due to unicompartmental arthroplasty of knee   Advance diet Up with therapy Discharge home with home health  DVT Prophylaxis - Xarelto Weight-Bearing as tolerated to right leg No vaccines. IV push meds D/C O2 and Pulse OX and try on Room [address]  Mickel Crow 04/28/2012, 7:43 AM

## 2012-04-28 NOTE — Op Note (Signed)
NAME:  [name], [name] NO.:  [id number]  MEDICAL RECORD NO.:  [id number]  LOCATION:  [id number]                         FACILITY:  [facility]  PHYSICIAN:  Gaynelle Arabian, M.D.    DATE OF BIRTH:  Nov 17, 1945  DATE OF PROCEDURE:  04/27/2012 DATE OF DISCHARGE:                              OPERATIVE REPORT   PREOPERATIVE DIAGNOSIS:  Failed right unicompartmental knee replacement.  POSTOPERATIVE DIAGNOSIS:  Failed right unicompartmental knee replacement.  PROCEDURE:  Revision of right knee unicompartmental to total knee arthroplasty.  SURGEON:  Gaynelle Arabian, M.D.  ASSISTANT:  Judith Part. Chabon, P.A.  ANESTHESIA:  Spinal.  ESTIMATED BLOOD LOSS:  Minimal.  DRAINS:  Hemovac x1.  TOURNIQUET TIME:  50 minutes at 300 mmHg.  COMPLICATIONS:  None.  CONDITION:  Stable to recovery.  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  He has failed nonoperative management and presents now for revision to total knee arthroplasty.  PROCEDURE IN DETAIL:  After successful administration of spinal anesthetic, a tourniquet was placed high on the right thigh and right lower extremity, prepped and draped in a usual sterile fashion. Extremity was wrapped in an Esmarch, knee flexed, and tourniquet inflated to 300 mmHg.  A midline incision was made with a 10 blade through subcutaneous tissue to the level of the extensor mechanism. Fresh blade was used to make a medial parapatellar arthrotomy.  Did not encounter any fluid in the joint.  Soft tissue over the proximal medial tibia subperiosteally elevated to the joint line with a knife and into the semimembranosus bursa with a Cobb elevator.  Soft tissue laterally was elevated with attention being paid to avoiding the patellar tendon on the tibial  tubercle.  Patella was everted and knee flexed 90 degrees. ACL and PCL removed.  I then removed the femoral component by disrupting the interface between the metal component and bone with osteotomes. There was minimal bone loss.  Drill was used then to create a starting hole in the distal femur.  The canal was thoroughly irrigated with saline.  A 5-degree right valgus alignment guide was placed and distal femoral cutting block pinned to remove an 11-mm of distal femur. Resection was made with an oscillating saw.  We got down to normal- appearing bone.  The tibia subluxed forward and the lateral meniscus removed.  The extramedullary tibial alignment guide was placed referencing proximally at the medial aspect of tibial tubercle and distally along the second metatarsal axis and tibial crest.  Block was pinned to make our resection level just below the medial polyethylene component.  Resection was made with an oscillating saw.  The bone was very sclerotic and the cement present medially.  I decided to place a 5-mm augment medial, so I resected additional 5 mm on the medial side to get down to normal bone. We then prepared the proximal tibia for size 5 MBT revision tray. Proximally, we prepared with the modular drill allowing for placement of 13 x 30 stem extension.  We then did a  keel punch.  Trial tibia was placed, size 5 MBT revision tray, 5 mm medial augment, and 13 x 30 stem extension.  There was a great fit on the tibial surface.  The femur was then prepared.  Next, the sizing guide was placed, size 4 was most appropriate.  Rotations marked at the epicondylar axis and confirmed by creating rectangular flexion gap at 90 degrees.  The block was pinned in this rotation.  The anterior, posterior, and chamfer cuts made.  Intercondylar blocks placed and cut was made.  Trial size 4 posterior stabilized femur was placed.  Start with 12.5 insert was a little bit of hyperextension, went to 15,  which allowed for full extension with excellent varus-valgus and anterior-posterior balance throughout full range of motion.  Patella was everted and showed severe degenerative changes of the patella with large spurs.  Scar was removed. Thickness was measured to be 27 mm.  Freehand resection taken to 15 mm, 41 template was placed, lug holes were drilled, trial patella was placed and it tracks normally.  Osteophytes removed of the posterior femur with the trial in place.  All trials were removed and the cement restrictor trial was placed, size 4 most appropriate.  Size 4 restrictor was placed the appropriate depth in the tibial canal.  The cut bone surfaces were then prepared with pulsatile lavage.  Cement was mixed and once ready for implantation, the size 5 MBT revision tray with 13 x 30 stem extension and a 5 mm medial augment was cemented into place.  Extruded cement removed.  Size 4 posterior stabilized femur was placed and a 15- mm insert placed.  Knee held in full extension, and then a 41 patella cemented in place and held with a clamp.  Once cement fully hardened and all extruded cement removed, the permanent 15 mm posterior stabilized rotating platform insert was placed into the tibial tray.  Wounds copiously irrigated with saline solution and the arthrotomy closed over Hemovac drain with running #1 V-Loc.  Flexion against gravity to 135 degrees.  Patella tracks normally.  Tourniquet was released total time of 54 minutes.  The subcu was then closed with interrupted 2-0 Vicryl, and subcuticular running 4-0 Monocryl.  Catheter for the Marcaine pain pumps placed and pumps initiated.  Incision was cleaned and dried, and Steri-Strips and a bulky sterile dressing applied.  He was placed into a knee immobilizer, awakened, and transported to recovery in stable condition.     Gaynelle Arabian, M.D.     FA/MEDQ  D:  04/27/2012  T:  04/28/2012  Job:  [id number]

## 2012-04-28 NOTE — Evaluation (Addendum)
Occupational Therapy Evaluation Patient Details Name: [name] MRN: [id number] DOB: 1946-07-30 Today's Date: 04/28/2012 Time: 1340-1409 OT Time Calculation (min): 29 min  OT Assessment / Plan / Recommendation Clinical Impression  Pt doing well POD 1 R uni knee conversion to R TKR. Skilled OT recommended to maximize independence with BADLs to supervision level in prep for d/c home with prn A from wife.    OT Assessment  Patient needs continued OT Services    Follow Up Recommendations  No OT follow up    Barriers to Discharge      Equipment Recommendations  None recommended by OT    Recommendations for Other Services    Frequency  Min 2X/week    Precautions / Restrictions Precautions Precautions: Knee Restrictions Weight Bearing Restrictions: No   Pertinent Vitals/Pain C/o 4/10 pain. RN notified & repositioned for comfort.    ADL  Grooming: Simulated;Min guard Where Assessed - Grooming: Supported standing Upper Body Bathing: Simulated;Set up Where Assessed - Upper Body Bathing: Unsupported sitting Lower Body Bathing: Simulated;Minimal assistance Where Assessed - Lower Body Bathing: Supported sit to stand Upper Body Dressing: Simulated;Set up Where Assessed - Upper Body Dressing: Unsupported sitting Lower Body Dressing: Simulated;Minimal assistance Where Assessed - Lower Body Dressing: Supported sit to stand Toilet Transfer: Performed;Minimal assistance Toilet Transfer Method: Sit to Loss adjuster, chartered: Raised toilet seat with arms (or 3-in-1 over toilet) Toileting - Clothing Manipulation and Hygiene: Simulated;Minimal assistance Where Assessed - Best boy and Hygiene: Sit to stand from 3-in-1 or toilet Equipment Used: Rolling walker;Gait belt Transfers/Ambulation Related to ADLs: Pt ambulated to the bathroom with minguard A. Wife is RN & will be able to assist pt with bathing/dressing.    OT Diagnosis: Generalized weakness  OT  Problem List: Decreased activity tolerance;Decreased knowledge of use of DME or AE OT Treatment Interventions: Self-care/ADL training;Therapeutic activities;DME and/or AE instruction;Patient/family education   OT Goals Acute Rehab OT Goals OT Goal Formulation: With patient/family Time For Goal Achievement: 05/05/12 Potential to Achieve Goals: Good ADL Goals Pt Will Transfer to Toilet: with supervision;3-in-1;Comfort height toilet;Ambulation ADL Goal: Toilet Transfer - Progress: Goal set today Pt Will Perform Toileting - Clothing Manipulation: with supervision;Sitting on 3-in-1 or toilet;Standing ADL Goal: Toileting - Clothing Manipulation - Progress: Goal set today Pt Will Perform Toileting - Hygiene: with supervision;Sit to stand from 3-in-1/toilet ADL Goal: Toileting - Hygiene - Progress: Goal set today Pt Will Perform Tub/Shower Transfer: Shower transfer;with supervision;Ambulation ADL Goal: Tub/Shower Transfer - Progress: Goal set today  Visit Information  Last OT Received On: 04/28/12 Assistance Needed: +1    Subjective Data  Subjective: I still go to work everyday. Patient Stated Goal: I want to be dancing on Friday and Saturday nights!   Prior Functioning  Vision/Perception  Home Living Lives With: Spouse Available Help at Discharge: Family Type of Home: House Home Access: Level entry Home Layout: Multi-level;Able to live on main level with bedroom/bathroom Bathroom Shower/Tub: Holiday representative Toilet: Handicapped height Home Adaptive Equipment: Grab bars in shower;Shower chair without back;Bedside commode/3-in-1;Walker - rolling;Walker - standard Prior Function Level of Independence: Independent Driving: Yes Vocation: Full time employment Communication Communication: No difficulties Dominant Hand: Right      Cognition  Overall Cognitive Status: Appears within functional limits for tasks assessed/performed Arousal/Alertness: Awake/alert Orientation  Level: Appears intact for tasks assessed Behavior During Session: [facility] for tasks performed    Extremity/Trunk Assessment Right Upper Extremity Assessment RUE ROM/Strength/Tone: [facility] for tasks assessed Left Upper Extremity Assessment LUE ROM/Strength/Tone:  WFL for tasks assessed   Mobility  Shoulder Instructions  Transfers Transfers: Sit to Stand;Stand to Sit Sit to Stand: 4: Min assist;With upper extremity assist;From chair/3-in-1;With armrests Stand to Sit: 4: Min assist;With upper extremity assist;With armrests;To chair/3-in-1 Details for Transfer Assistance: Min VCs for hand placement and RLE management.       Exercise     Balance     End of Session OT - End of Session Equipment Utilized During Treatment: Gait belt Activity Tolerance: Patient tolerated treatment well Patient left: in chair;with call bell/phone within reach CPM Right Knee CPM Right Knee: Off  [place] A OTR/L [id number] 04/28/2012, 3:20 PM

## 2012-04-28 NOTE — Evaluation (Signed)
Physical Therapy Evaluation Patient Details Name: [name] MRN: [id number] DOB: 06/20/1946 Today's Date: 04/28/2012 Time: 1200-1229 PT Time Calculation (min): 29 min  PT Assessment / Plan / Recommendation Clinical Impression  Pt s/p R TKR presents with decreased R LE strength/ROM and pain limiting functional mobility    PT Assessment  Patient needs continued PT services    Follow Up Recommendations  Home health PT    Barriers to Discharge        Equipment Recommendations  None recommended by OT    Recommendations for Other Services OT consult   Frequency 7X/week    Precautions / Restrictions Precautions Precautions: Knee Required Braces or Orthoses: Knee Immobilizer - Right Knee Immobilizer - Right: Discontinue once straight leg raise with < 10 degree lag (Pt performed IND SLR this am) Restrictions Weight Bearing Restrictions: No Other Position/Activity Restrictions: WBAT   Pertinent Vitals/Pain 5/10l; premedicated, cold packs provided      Mobility  Bed Mobility Bed Mobility: Supine to Sit Supine to Sit: 4: Min assist Details for Bed Mobility Assistance: cues for sequence Transfers Transfers: Sit to Stand;Stand to Sit Sit to Stand: 4: Min assist;With upper extremity assist;From chair/3-in-1;With armrests Stand to Sit: 4: Min assist;With upper extremity assist;With armrests;To chair/3-in-1 Details for Transfer Assistance: Min VCs for hand placement and RLE management. Ambulation/Gait Ambulation/Gait Assistance: 4: Min assist;3: Mod assist Ambulation Distance (Feet): 48 Feet Assistive device: Rolling walker Ambulation/Gait Assistance Details: cues for sequence, posture, position from RW Gait Pattern: Step-to pattern    Exercises Total Joint Exercises Ankle Circles/Pumps: AROM;10 reps;Supine;Both Quad Sets: AROM;10 reps;Supine;Both Heel Slides: AAROM;10 reps;Supine;Right Straight Leg Raises: AROM;AAROM;15 reps;Right   PT Diagnosis: Difficulty walking  PT  Problem List: Decreased strength;Decreased range of motion;Decreased activity tolerance;Decreased mobility;Decreased knowledge of use of DME;Pain PT Treatment Interventions: DME instruction;Gait training;Stair training;Functional mobility training;Therapeutic activities;Therapeutic exercise;Patient/family education   PT Goals Acute Rehab PT Goals PT Goal Formulation: With patient Time For Goal Achievement: 04/28/12 Potential to Achieve Goals: Good Pt will go Supine/Side to Sit: with supervision PT Goal: Supine/Side to Sit - Progress: Goal set today Pt will go Sit to Supine/Side: with supervision PT Goal: Sit to Supine/Side - Progress: Goal set today Pt will go Sit to Stand: with supervision PT Goal: Sit to Stand - Progress: Goal set today Pt will go Stand to Sit: with supervision PT Goal: Stand to Sit - Progress: Goal set today Pt will Ambulate: >150 feet;with supervision;with rolling walker PT Goal: Ambulate - Progress: Goal set today  Visit Information  Last PT Received On: 04/28/12 Assistance Needed: +1    Subjective Data  Subjective: My wife is a nurse [place] Patient Stated Goal: Resume previous active lifestyle with decreased pain   Prior Functioning  Home Living Lives With: Spouse Available Help at Discharge: Family Type of Home: House Home Access: Level entry Home Layout: Multi-level;Able to live on main level with bedroom/bathroom Bathroom Shower/Tub: Holiday representative Toilet: Handicapped height Home Adaptive Equipment: Grab bars in shower;Shower chair without back;Bedside commode/3-in-1;Walker - rolling;Walker - standard Prior Function Level of Independence: Independent Able to Take Stairs?: Yes Driving: Yes Vocation: Full time employment Communication Communication: No difficulties Dominant Hand: Right    Cognition  Overall Cognitive Status: Appears within functional limits for tasks assessed/performed Arousal/Alertness:  Awake/alert Orientation Level: Appears intact for tasks assessed Behavior During Session: [facility] for tasks performed    Extremity/Trunk Assessment Right Upper Extremity Assessment RUE ROM/Strength/Tone: [facility] for tasks assessed Left Upper Extremity Assessment LUE  ROM/Strength/Tone: [facility] for tasks assessed Right Lower Extremity Assessment RLE ROM/Strength/Tone: Deficits RLE ROM/Strength/Tone Deficits: Quads 3/5 with AAROM at knee -10 - 40 Left Lower Extremity Assessment LLE ROM/Strength/Tone: WFL for tasks assessed   Balance    End of Session PT - End of Session Equipment Utilized During Treatment: Gait belt Activity Tolerance: Patient tolerated treatment well Patient left: in chair;with call bell/phone within reach;with family/visitor present Nurse Communication: Mobility status CPM Right Knee CPM Right Knee: Off  GP     Timo Hartwig 04/28/2012, 3:57 PM

## 2012-04-28 NOTE — Progress Notes (Signed)
Physical Therapy Treatment Patient Details Name: [name] MRN: [id number] DOB: 1946-05-02 Today's Date: 04/28/2012 Time: [id number] PT Time Calculation (min): 24 min  PT Assessment / Plan / Recommendation Comments on Treatment Session       Follow Up Recommendations  Home health PT    Barriers to Discharge        Equipment Recommendations  None recommended by OT    Recommendations for Other Services OT consult  Frequency 7X/week   Plan Discharge plan remains appropriate    Precautions / Restrictions Precautions Precautions: Knee Restrictions Weight Bearing Restrictions: No Other Position/Activity Restrictions: WBAT   Pertinent Vitals/Pain 4/10; premedicated    Mobility  Bed Mobility Bed Mobility: Sit to Supine Sit to Supine: 4: Min assist Details for Bed Mobility Assistance: cues for sequence Transfers Transfers: Sit to Stand;Stand to Sit Sit to Stand: 4: Min assist;With upper extremity assist;From chair/3-in-1;With armrests Stand to Sit: 4: Min assist;With upper extremity assist;With armrests;To chair/3-in-1 Details for Transfer Assistance: Min VCs for hand placement and RLE management. Ambulation/Gait Ambulation/Gait Assistance: 4: Min assist Ambulation Distance (Feet): 189 Feet Assistive device: Rolling walker Ambulation/Gait Assistance Details: cues for sequence, posture, and position from RW Gait Pattern: Step-to pattern    Exercises     PT Diagnosis:    PT Problem List:   PT Treatment Interventions:     PT Goals Acute Rehab PT Goals PT Goal Formulation: With patient Time For Goal Achievement: 04/28/12 Potential to Achieve Goals: Good Pt will go Supine/Side to Sit: with supervision PT Goal: Supine/Side to Sit - Progress: Goal set today Pt will go Sit to Supine/Side: with supervision PT Goal: Sit to Supine/Side - Progress: Goal set today Pt will go Sit to Stand: with supervision PT Goal: Sit to Stand - Progress: Progressing toward goal Pt will  go Stand to Sit: with supervision PT Goal: Stand to Sit - Progress: Progressing toward goal Pt will Ambulate: >150 feet;with supervision;with rolling walker PT Goal: Ambulate - Progress: Progressing toward goal  Visit Information  Last PT Received On: 04/28/12 Assistance Needed: +1    Subjective Data  Patient Stated Goal: Resume previous active lifestyle with decreased pain   Cognition  Overall Cognitive Status: Appears within functional limits for tasks assessed/performed Arousal/Alertness: Awake/alert Orientation Level: Appears intact for tasks assessed Behavior During Session: [facility] for tasks performed    Balance     End of Session PT - End of Session Equipment Utilized During Treatment: Gait belt Activity Tolerance: Patient tolerated treatment well Patient left: with call bell/phone within reach;in bed Nurse Communication: Mobility status CPM Right Knee CPM Right Knee: Off   GP     Remo Kirschenmann 04/28/2012, 4:02 PM

## 2012-04-29 DIAGNOSIS — IMO0002 Reserved for concepts with insufficient information to code with codable children: Secondary | ICD-10-CM | POA: Diagnosis not present

## 2012-04-29 LAB — BASIC METABOLIC PANEL
CO2: 27 mEq/L (ref 19–32)
Calcium: 8.6 mg/dL (ref 8.4–10.5)
Chloride: 98 mEq/L (ref 96–112)
Creatinine, Ser: 0.92 mg/dL (ref 0.50–1.35)
Glucose, Bld: 152 mg/dL — ABNORMAL HIGH (ref 70–99)
Sodium: 135 mEq/L (ref 135–145)

## 2012-04-29 LAB — GLUCOSE, CAPILLARY: Glucose-Capillary: 158 mg/dL — ABNORMAL HIGH (ref 70–99)

## 2012-04-29 LAB — CBC
Hemoglobin: 11.3 g/dL — ABNORMAL LOW (ref 13.0–17.0)
MCH: 31.7 pg (ref 26.0–34.0)
MCV: 88.8 fL (ref 78.0–100.0)
RBC: 3.57 MIL/uL — ABNORMAL LOW (ref 4.22–5.81)
WBC: 9.2 10*3/uL (ref 4.0–10.5)

## 2012-04-29 MED ORDER — METHOCARBAMOL 500 MG PO TABS: 500.0000 mg | ORAL_TABLET | Freq: Four times a day (QID) | ORAL | Status: AC | PRN

## 2012-04-29 MED ORDER — RIVAROXABAN 10 MG PO TABS: 10.0000 mg | ORAL_TABLET | Freq: Every day | ORAL | Status: DC

## 2012-04-29 MED ORDER — HYDROCODONE-ACETAMINOPHEN 5-325 MG PO TABS
1.0000 | ORAL_TABLET | ORAL | Status: DC | PRN
  Administered 2012-04-29 – 2012-04-30 (×3): 2 via ORAL
  Filled 2012-04-29: qty 2
  Filled 2012-04-29 (×2): qty 1
  Filled 2012-04-29: qty 2

## 2012-04-29 MED ORDER — OXYCODONE HCL 5 MG PO TABS: 5.0000 mg | ORAL_TABLET | ORAL | Status: AC | PRN

## 2012-04-29 NOTE — Progress Notes (Signed)
Occupational Therapy Treatment Patient Details Name: [name] MRN: [id number] DOB: 03-09-1946 Today's Date: 04/29/2012 Time: [id number] OT Time Calculation (min): 19 min  OT Assessment / Plan / Recommendation Comments on Treatment Session Pt limited this session by pain but wife should be able to assist pt at home.    Follow Up Recommendations  No OT follow up    Barriers to Discharge       Equipment Recommendations  None recommended by OT    Recommendations for Other Services    Frequency Min 2X/week   Plan Discharge plan remains appropriate    Precautions / Restrictions Precautions Precautions: Knee Required Braces or Orthoses: Knee Immobilizer - Right Knee Immobilizer - Right: Discontinue once straight leg raise with < 10 degree lag Restrictions Weight Bearing Restrictions: No   Pertinent Vitals/Pain Pt reported 10/10 pain in RLE today. Pt was premedicated prior to OTs arrival.    ADL  Tub/Shower Transfer: Performed;Minimal assistance Tub/Shower Transfer Method: Therapist, art: Walk in shower Equipment Used: Rolling walker ADL Comments: Educated pt and wife how to safely step into the shower. Wife states their threshold is lower at home.    OT Diagnosis:    OT Problem List:   OT Treatment Interventions:     OT Goals ADL Goals ADL Goal: Tub/Shower Transfer - Progress: Progressing toward goals  Visit Information  Last OT Received On: 04/29/12 Assistance Needed: +1    Subjective Data  Subjective: [name] was a walk in the park compared to today.   Prior Functioning       Cognition  Overall Cognitive Status: Appears within functional limits for tasks assessed/performed Arousal/Alertness: Awake/alert Orientation Level: Appears intact for tasks assessed Behavior During Session: [facility] for tasks performed    Mobility  Shoulder Instructions Bed Mobility Supine to Sit: 4: Min assist;With rails;HOB elevated Details for Bed  Mobility Assistance: min cues for hand placement and sequencing. Transfers Sit to Stand: 4: Min assist;With upper extremity assist;From bed Details for Transfer Assistance: Min VCs for hand placement and RLE management.       Exercises      Balance     End of Session OT - End of Session Equipment Utilized During Treatment: Gait belt Activity Tolerance: Patient limited by pain Patient left: Other (comment) (ambulating with PT.)  GO     [name] A OTR/L [phone] 04/29/2012, 10:48 AM

## 2012-04-29 NOTE — Progress Notes (Signed)
Placed patient on auto-mode CPAP with maximum pressure of 25cm and minimum pressure of 4cm. Oxygen set at 2lpm with Sp02=96%. Will continue to monitor.

## 2012-04-29 NOTE — Progress Notes (Signed)
Physical Therapy Treatment Patient Details Name: [name] MRN: [id number] DOB: Dec 06, 1945 Today's Date: 04/29/2012 Time: [id number] PT Time Calculation (min): 40 min  PT Assessment / Plan / Recommendation Comments on Treatment Session  Amb pt again and performed TKR TE's, also given handout HEP and instructed on feq and use of ICE after. Both pt and spouse are NOT comfotable with D/C to home today, reported to nurse.  Pt having increased issues with pain control.    Follow Up Recommendations  Home health PT    Barriers to Discharge        Equipment Recommendations  None recommended by PT;None recommended by OT    Recommendations for Other Services    Frequency 7X/week   Plan Discharge plan remains appropriate    Precautions / Restrictions Precautions Precautions: Knee Precaution Comments: instructed pt on KI use for ambulation Required Braces or Orthoses: Knee Immobilizer - Right Knee Immobilizer - Right: Discontinue once straight leg raise with < 10 degree lag Restrictions Weight Bearing Restrictions: No Other Position/Activity Restrictions: WBAT   Pertinent Vitals/Pain C/o 6/10 R knee pain ICE applied    Mobility  Transfers Transfers: Sit to Stand;Stand to Sit Sit to Stand: 4: Min assist;From bed;3: Mod assist Stand to Sit: 3: Mod assist;4: Min assist;To chair/3-in-1 Details for Transfer Assistance: 50% VC's on proper tech and hand placement. Plus increased time needed.  Ambulation/Gait Ambulation/Gait Assistance: 4: Min assist;3: Mod assist Ambulation Distance (Feet): 85 Feet Assistive device: Rolling walker Ambulation/Gait Assistance Details: increased time and 25% VC's on proper sequencing and R LE placement within RW.  Pt c/o increased pain today vs yesterday. Pt also c/o L outter ankle pain. No swelling or discolor noted. Gait Pattern: Step-to pattern;Decreased stance time - right;Trunk flexed Gait velocity: decreased    Exercises Total Joint  Exercises Ankle Circles/Pumps: AROM;Both;10 reps;Supine Quad Sets: AROM;Both;10 reps;Supine Gluteal Sets: AROM;Both;10 reps;Supine Towel Squeeze: AROM;Both;10 reps;Supine Heel Slides: AAROM;Right;10 reps;Supine Hip ABduction/ADduction: AAROM;Right;10 reps;Supine Straight Leg Raises: AAROM;Right;10 reps;Supine    PT Goals                        progressing    Visit Information  Last PT Received On: 04/29/12 Assistance Needed: +1    Subjective Data  Subjective: I'm hurting more today Patient Stated Goal: home    Cognition  Overall Cognitive Status: Appears within functional limits for tasks assessed/performed Arousal/Alertness: Awake/alert Orientation Level: Appears intact for tasks assessed Behavior During Session: [facility] for tasks performed    Balance   fair+  End of Session PT - End of Session Equipment Utilized During Treatment: Gait belt Activity Tolerance: Patient limited by pain Patient left: in chair;with call bell/phone within reach;with family/visitor present (ICE to R knee)   Rica Koyanagi  PTA [facility]  Acute  Rehab Pager     [phone]

## 2012-04-29 NOTE — Progress Notes (Signed)
Subjective: 2 Days Post-Op Procedure(s) (LRB): CONVERSION TO TOTAL KNEE (Right) Patient reports pain as mild.   Patient seen in rounds with Dr. Wynelle Link. Patient is well, and has had no acute complaints or problems Patient is doing fairly well and as long as he continues to progress, he will likely be ready later today if does well with therapy.  Will get staff to call if he is ready to go home today.  Objective: Vital signs in last 24 hours: Temp:  [97.5 F (36.4 C)-99.3 F (37.4 C)] 99.3 F (37.4 C) (09/06 0428) Pulse Rate:  [52-77] 76  (09/06 0428) Resp:  [16] 16  (09/06 0428) BP: (107-167)/(55-83) 167/83 mmHg (09/06 0428) SpO2:  [93 %-97 %] 97 % (09/06 0428)  Intake/Output from previous day:  Intake/Output Summary (Last 24 hours) at 04/29/12 0814 Last data filed at 04/29/12 0554  Gross per 24 hour  Intake   1435 ml  Output   1600 ml  Net   -165 ml    Labs:  Basename 04/29/12 0410 04/28/12 0350  HGB 11.3* 11.5*    Basename 04/29/12 0410 04/28/12 0350  WBC 9.2 8.1  RBC 3.57* 3.64*  HCT 31.7* 32.7*  PLT 123* 136*    Basename 04/29/12 0410 04/28/12 0350  NA 135 136  K 3.7 3.6  CL 98 102  CO2 27 27  BUN 9 12  CREATININE 0.92 0.97  GLUCOSE 152* 134*  CALCIUM 8.6 8.2*   No results found for this basename: LABPT:2,INR:2 in the last 72 hours  EXAM: General - Patient is Alert, Appropriate and Oriented Extremity - Neurovascular intact Sensation intact distally Dorsiflexion/Plantar flexion intact No cellulitis present Incision - clean, dry, no drainage, healing Motor Function - intact, moving foot and toes well on exam.  Pain pump removed without difficulty.  Assessment/Plan: 2 Days Post-Op Procedure(s) (LRB): CONVERSION TO TOTAL KNEE (Right) Procedure(s) (LRB): CONVERSION TO TOTAL KNEE (Right) Past Medical History  Diagnosis Date  . Diastolic CHF, chronic     a. EF 55-60% by echo 2007  . Hypertension   . Coronary artery disease     a. s/p MI &  CABG; b. s/p PCI Diag;  c. Cath 12/2011: LAD diff dzs/small, Diag 75% isr, 90 dist to stent (small), LCX & RCA occluded, VG->OM 40, VG->PDA patent - med rx.  . History of transient ischemic attack (TIA)     CAROTID DOPPLERS NOV 2011  0-39& BIL. STENOSIS  . Hyperlipidemia   . Allergic rhinitis   . History of diverticulitis of colon     5 YRS AGO  . Diabetes mellitus     ORAL MEDS  . 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 Anxiety   . Depression   . Neuromuscular disorder     left arm numbness   . GERD (gastroesophageal reflux disease)   . Cancer     hx of skin cancers   . Sleep apnea     cpap- see ov note in [facility] 05/12/11 for settings    Principal Problem:  *Pain due to unicompartmental arthroplasty of knee Active Problems:  Expected Blood loss, postoperative   Up with therapy Discharge home with home health as long as he meets all goals. Diet - Cardiac Diet, Diabetic Diet Follow up - in 2 weeks Activity - WBAT Disposition - Home Condition Upon Discharge - Good D/C Meds - See DC Summary DVT Prophylaxis - Xarelto  PERKINS, ALEXZANDREW  04/29/2012, 8:14 AM

## 2012-04-29 NOTE — Progress Notes (Signed)
Physical Therapy Treatment Patient Details Name: [name] MRN: [id number] DOB: [birth year] Today's Date: 04/29/2012 Time: 1015-1050 PT Time Calculation (min): 35 min  PT Assessment / Plan / Recommendation Comments on Treatment Session  Pt c/o increased R knee pain today with increased difficulty amb vs yesterday.  Pt also c/o L outter ankle pain the he stated, "started last night". Spouse concerned about posss of pt going home today.  "He did better yesterday" stated spouse. Will see agin for PT.    Follow Up Recommendations  Home health PT    Barriers to Discharge        Equipment Recommendations  None recommended by PT;None recommended by OT    Recommendations for Other Services    Frequency 7X/week   Plan Discharge plan remains appropriate    Precautions / Restrictions Precautions Precautions: Knee Precaution Comments: instructed pt on KI use for ambulation Required Braces or Orthoses: Knee Immobilizer - Right Knee Immobilizer - Right: Discontinue once straight leg raise with < 10 degree lag Restrictions Weight Bearing Restrictions: No Other Position/Activity Restrictions: WBAT    Pertinent Vitals/Pain C/o 7/10 R knee pain 8/10 L ankle pain    Mobility  Bed Mobility Bed Mobility: Supine to Sit Supine to Sit: 3: Mod assist Details for Bed Mobility Assistance: Mod assist to pull upper body to sit and mod assist to support R LE off bed.  Transfers Transfers: Sit to Stand;Stand to Sit Sit to Stand: 4: Min assist;From bed;3: Mod assist Stand to Sit: 3: Mod assist;4: Min assist;To chair/3-in-1 Details for Transfer Assistance: 50% VC's on proper tech and hand placement. Plus increased time needed.  Ambulation/Gait Ambulation/Gait Assistance: 4: Min assist;3: Mod assist Ambulation Distance (Feet): 65 Feet Assistive device: Rolling walker Ambulation/Gait Assistance Details: increased time and 25% VC's on proper sequencing and R LE placement within RW.  Pt c/o  increased pain today vs yesterday. Pt also c/o L outter ankle pain. No swelling or discolor noted. Gait Pattern: Step-to pattern;Decreased stance time - right;Trunk flexed Gait velocity: decreased     PT Goals                        progressing    Visit Information  Last PT Received On: 04/29/12 Assistance Needed: +1    Subjective Data  Subjective: I'm hurting more today Patient Stated Goal: home    Cognition  Overall Cognitive Status: Appears within functional limits for tasks assessed/performed Arousal/Alertness: Awake/alert Orientation Level: Appears intact for tasks assessed Behavior During Session: [facility] for tasks performed    Balance   fair+  End of Session PT - End of Session Equipment Utilized During Treatment: Gait belt;Right knee immobilizer Activity Tolerance: Patient limited by pain Patient left: in chair;with call bell/phone within reach;with family/visitor present (ICE to R knee)   [name]  PTA [facility]  Acute  Rehab Pager     [phone]

## 2012-04-29 NOTE — Care Management Note (Unsigned)
    Page 1 of 2   04/29/2012     11:35:18 AM   CARE MANAGEMENT NOTE 04/29/2012  Patient:  [name], [name]   Account Number:  [id number]  Date Initiated:  04/29/2012  Documentation initiated by:  Sherrin Daisy  Subjective/Objective Assessment:   dx failed rt knee unicompartmental replacement; Conversion to total knee replacemnt     Action/Plan:   Pt states he plans to [name] home with spouse as caregiver upon discharge. Already has DME-RW from prev surgery. Wishes to use [facility] for [facility] services required   Anticipated DC Date:  04/30/2012   Anticipated DC Plan:  [place] referral  NA      DC Planning Services  CM consult      [facility] Choice  HOME HEALTH   Choice offered to / List presented to:  C-1 Patient   DME arranged  NA      DME agency  NA     [place] arranged  HH-2 PT      [place]   Status of service:  Completed, signed off Medicare Important Message given?  NA - LOS <3 / Initial given by admissions (If response is "NO", the following Medicare IM given date fields will be blank) Date Medicare IM given:   Date Additional Medicare IM given:    Discharge Disposition:    Per UR Regulation:  Reviewed for med. necessity/level of care/duration of stay  If discussed at [place] of Stay Meetings, dates discussed:    Comments:  04/28/2012 [place] Highland BSN CCM [phone] [name] will start [facility] services day after discharge. Anticipate d/c today or tomorrow

## 2012-04-29 NOTE — Progress Notes (Signed)
Physical Therapy Treatment Patient Details Name: [name] MRN: [id number] DOB: 11-May-1946 Today's Date: 04/29/2012 Time: [id number] PT Time Calculation (min): 25 min  PT Assessment / Plan / Recommendation Comments on Treatment Session  Amb pt third time, progressing better but still slow with c/o L ankle pain remaining.  Pt fully WB and tolerating amb. Assisted pt back to bed for CPM.    Follow Up Recommendations  Home health PT    Barriers to Discharge        Equipment Recommendations  None recommended by PT;None recommended by OT    Recommendations for Other Services    Frequency 7X/week   Plan Discharge plan remains appropriate    Precautions / Restrictions Precautions Precautions: Knee Precaution Comments: instructed pt on KI use for ambulation Required Braces or Orthoses: Knee Immobilizer - Right Knee Immobilizer - Right: Discontinue once straight leg raise with < 10 degree lag Restrictions Weight Bearing Restrictions: No Other Position/Activity Restrictions: WBAT   Pertinent Vitals/Pain C/o 5/10 R knee pain    Mobility  Bed Mobility Bed Mobility: Sit to Supine Supine to Sit: 3: Mod assist Sit to Supine: 4: Min assist Details for Bed Mobility Assistance: Min assist to support R LE up onto bed  Transfers Transfers: Sit to Stand;Stand to Sit Sit to Stand: 4: Min guard;4: Min assist;From chair/3-in-1 Stand to Sit: 4: Min guard;4: Min assist;To bed Details for Transfer Assistance: 25% VC's on proper tech, hand placement and to extend R LE prior to sit  Ambulation/Gait Ambulation/Gait Assistance: 4: Min Wellsite geologist (Feet): 75 Feet Assistive device: Rolling walker Ambulation/Gait Assistance Details: increased time, short step length and still c/o L ankle pain. RN aware. Gait Pattern: Step-to pattern;Trunk flexed Gait velocity: decreased         PT Goals                     progressing    Visit Information  Last PT Received On:  04/29/12 Assistance Needed: +1    Subjective Data  Subjective: I'm hurting more today Patient Stated Goal: home              End of Session PT - End of Session Equipment Utilized During Treatment: Gait belt Activity Tolerance: Patient tolerated treatment well Patient left: in bed;with call bell/phone within reach;with family/visitor present  Rica Koyanagi  PTA [facility]  Acute  Rehab Pager     [phone]

## 2012-04-29 NOTE — Discharge Summary (Signed)
Physician Discharge Summary   Patient ID: [name] MRN: [id number] DOB/AGE: Apr 03, 1946 66 y.o.  Admit date: 04/27/2012 Discharge date: 04/29/2012  Primary Diagnosis: Failed right unicompartmental knee replacement.  Admission Diagnoses:  Past Medical History  Diagnosis Date  . Diastolic CHF, chronic     a. EF 55-60% by echo 2007  . Hypertension   . Coronary artery disease     a. s/p MI & CABG; b. s/p PCI Diag;  c. Cath 12/2011: LAD diff dzs/small, Diag 75% isr, 90 dist to stent (small), LCX & RCA occluded, VG->OM 40, VG->PDA patent - med rx.  . History of transient ischemic attack (TIA)     CAROTID DOPPLERS NOV 2011  0-39& BIL. STENOSIS  . Hyperlipidemia   . Allergic rhinitis   . History of diverticulitis of colon     5 YRS AGO  . Diabetes mellitus     ORAL MEDS  . 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 Anxiety   . Depression   . Neuromuscular disorder     left arm numbness   . GERD (gastroesophageal reflux disease)   . Cancer     hx of skin cancers   . Sleep apnea     cpap- see ov note in [facility] 05/12/11 for settings    Discharge Diagnoses:   Principal Problem:  *Pain due to unicompartmental arthroplasty of knee Active Problems:  Expected Blood loss, postoperative  Procedure:  Procedure(s) (LRB): CONVERSION TO TOTAL KNEE (Right)   Consults: None  HPI: [name] is a 66 year old male who had a unicompartmental replacement done several years ago, did well for long time, then has had progressively worsening pain and progressive arthritis in the patellofemoral compartment. He has failed nonoperative management and presents now for revision to total knee arthroplasty.  Laboratory Data: Admission on 01/07/2012, Discharged on 01/07/2012  Component Date Value Range Status  . Operator id 01/07/2012 [id number]   Final  . Glucose, Bld 01/07/2012 109* 70 - 99 mg/dL Final    Basename 04/29/12 0410 04/28/12 0350  HGB 11.3* 11.5*     Basename 04/29/12 0410 04/28/12 0350  WBC 9.2 8.1  RBC 3.57* 3.64*  HCT 31.7* 32.7*  PLT 123* 136*    Basename 04/29/12 0410 04/28/12 0350  NA 135 136  K 3.7 3.6  CL 98 102  CO2 27 27  BUN 9 12  CREATININE 0.92 0.97  GLUCOSE 152* 134*  CALCIUM 8.6 8.2*   No results found for this basename: LABPT:2,INR:2 in the last 72 hours  X-Rays:Dg Chest 2 View  04/20/2012  *RADIOLOGY REPORT*  Clinical Data: Preop.  CHEST - 2 VIEW  Comparison: 06/06/2010.  Findings: Trachea is midline.  Heart size stable.  Lungs are low in volume with probable vascular crowding.  Slight blunting of the right costophrenic angle without correlation on the lateral view, favoring scarring or fat.  No pleural fluid.  IMPRESSION: Low lung volumes without acute finding.   Original Report Authenticated By: Luretha Rued, M.D.     EKG: Orders placed in visit on 01/01/12  . EKG 12-LEAD     Hospital Course: Patient was admitted to [facility] and taken to the OR and underwent the above state procedure without complications.  Patient tolerated the procedure well and was later transferred to the recovery room and then to the orthopaedic floor for postoperative care.  They were given PO and IV analgesics for pain control following  their surgery.  They were given 24 hours of postoperative antibiotics and started on DVT prophylaxis in the form of Xarelto.   PT and OT were ordered for total joint protocol.  Discharge planning consulted to help with postop disposition and equipment needs.  Patient had a decent night on the evening of surgery and started to get up OOB with therapy on day one.  He walked 48 feet and 198 feet with therapy. Hemovac drain was pulled without difficulty.   Dressing was changed on day one due to drainage on the dressing and the incision looked great.  The drainage was coming from the hemovac area.  Patient was seen in rounds by Dr. Wynelle Link and there was a possibility of the patient going home  on day 2.  Will have him work with therapy and if he meets his goals then home later on day 2.  Discharge Medications: Prior to Admission medications   Medication Sig Start Date End Date Taking? Authorizing Provider  carvedilol (COREG) 25 MG tablet Take 12.5 mg by mouth 2 (two) times daily with a meal. Take 1/2 tablet twice a day 06/29/11  Yes [name], MD  cetirizine (ZYRTEC) 10 MG tablet Take 10 mg by mouth daily. As needed   Yes Historical Provider, MD  fluticasone (FLONASE) 50 MCG/ACT nasal spray Place 1 spray into the nose daily.   Yes Historical Provider, MD  KLOR-CON M20 20 MEQ tablet Take 1 tablet by mouth daily. 01/08/12  Yes Historical Provider, MD  lansoprazole (PREVACID) 30 MG capsule Take 15 mg by mouth daily. Pt takes in evening, rarely takes   Yes Historical Provider, MD  metFORMIN (GLUCOPHAGE) 500 MG tablet Take 500 mg by mouth 2 (two) times daily with a meal.   Yes Historical Provider, MD  rosuvastatin (CRESTOR) 10 MG tablet Take 5 mg by mouth every evening.   Yes Historical Provider, MD  Testosterone 20.25 MG/ACT (1.62%) GEL Apply 2 Squirts topically daily. Apply to upper arm daily   Yes Historical Provider, MD  zolpidem (AMBIEN) 10 MG tablet Take 10 mg by mouth at bedtime as needed. For sleep   Yes Historical Provider, MD  furosemide (LASIX) 40 MG tablet Take 40 mg by mouth daily.    Historical Provider, MD  ipratropium (ATROVENT) 0.03 % nasal spray Place 2 sprays into the nose 4 (four) times daily as needed. As needed 05/12/11 05/11/12  [name], MD  methocarbamol (ROBAXIN) 500 MG tablet Take 1 tablet (500 mg total) by mouth every 6 (six) hours as needed. Rx provided. 04/29/12 05/09/12  Anilah Huck, PA  nitroGLYCERIN (NITROSTAT) 0.4 MG SL tablet Place 1 tablet (0.4 mg total) under the tongue every 5 (five) minutes as needed for chest pain. 01/01/12 12/31/12  Liliane Shi, PA  oxyCODONE (OXY IR/ROXICODONE) 5 MG immediate release tablet Take 1-4 tablets (5-20 mg  total) by mouth every 4 (four) hours as needed for pain. Rx provided. 04/29/12 05/09/12  Dalila Arca Dara Lords, PA  rivaroxaban (XARELTO) 10 MG TABS tablet Take 1 tablet (10 mg total) by mouth daily with breakfast. Take Xarelto for two and a half more weeks, then discontinue Xarelto. Once the patient has completed the Xarelto, they may resume the 81 mg Aspirin. Rx provided.  04/29/12   Nasire Reali Dara Lords, PA    Diet: Cardiac diet and Diabetic diet Activity:WBAT Follow-up:in 2 weeks Disposition - Home Discharged Condition: good   Discharge Orders    Future Orders Please Complete By Expires   Diet -  low sodium heart healthy      Diet Carb Modified      Call MD / Call 911      Comments:   If you experience chest pain or shortness of breath, CALL 911 and be transported to the hospital emergency room.  If you develope a fever above 101 F, pus (white drainage) or increased drainage or redness at the wound, or calf pain, call your surgeon's office.   Discharge instructions      Comments:   Pick up stool softner and laxative for home. Do not submerge incision under water. May shower. Continue to use ice for pain and swelling from surgery.  Take Xarelto for two and a half more weeks, then discontinue Xarelto.   Constipation Prevention      Comments:   Drink plenty of fluids.  Prune juice may be helpful.  You may use a stool softener, such as Colace (over the counter) 100 mg twice a day.  Use MiraLax (over the counter) for constipation as needed.   Increase activity slowly as tolerated      Patient may shower      Comments:   You may shower without a dressing once there is no drainage.  Do not wash over the wound.  If drainage remains, do not shower until drainage stops.   Driving restrictions      Comments:   No driving until released by the physician.   Lifting restrictions      Comments:   No lifting until released by the physician.   TED hose      Comments:   Use stockings (TED hose)  for 3 weeks on both leg(s).  You may remove them at night for sleeping.   Change dressing      Comments:   Change dressing daily with sterile 4 x 4 inch gauze dressing and apply TED hose. Do not submerge the incision under water.   Do not put a pillow under the knee. Place it under the heel.      Do not sit on low chairs, stoools or toilet seats, as it may be difficult to get up from low surfaces        Medication List  As of 04/29/2012  8:21 AM   STOP taking these medications         aspirin 81 MG tablet      cyclobenzaprine 10 MG tablet      ergocalciferol 50000 UNITS capsule      HYDROcodone-acetaminophen 5-500 MG per tablet         TAKE these medications         carvedilol 25 MG tablet   Commonly known as: COREG   Take 12.5 mg by mouth 2 (two) times daily with a meal. Take 1/2 tablet twice a day      cetirizine 10 MG tablet   Commonly known as: ZYRTEC   Take 10 mg by mouth daily. As needed      fluticasone 50 MCG/ACT nasal spray   Commonly known as: FLONASE   Place 1 spray into the nose daily.      furosemide 40 MG tablet   Commonly known as: LASIX   Take 40 mg by mouth daily.      ipratropium 0.03 % nasal spray   Commonly known as: ATROVENT   Place 2 sprays into the nose 4 (four) times daily as needed. As needed      KLOR-CON M20 20 MEQ tablet  Generic drug: potassium chloride SA   Take 1 tablet by mouth daily.      lansoprazole 30 MG capsule   Commonly known as: PREVACID   Take 15 mg by mouth daily. Pt takes in evening, rarely takes      metFORMIN 500 MG tablet   Commonly known as: GLUCOPHAGE   Take 500 mg by mouth 2 (two) times daily with a meal.      methocarbamol 500 MG tablet   Commonly known as: ROBAXIN   Take 1 tablet (500 mg total) by mouth every 6 (six) hours as needed.      nitroGLYCERIN 0.4 MG SL tablet   Commonly known as: NITROSTAT   Place 1 tablet (0.4 mg total) under the tongue every 5 (five) minutes as needed for chest pain.       oxyCODONE 5 MG immediate release tablet   Commonly known as: Oxy IR/ROXICODONE   Take 1-4 tablets (5-20 mg total) by mouth every 4 (four) hours as needed for pain.      rivaroxaban 10 MG Tabs tablet   Commonly known as: XARELTO   Take 1 tablet (10 mg total) by mouth daily with breakfast.      rosuvastatin 10 MG tablet   Commonly known as: CRESTOR   Take 5 mg by mouth every evening.      Testosterone 20.25 MG/ACT (1.62%) Gel   Apply 2 Squirts topically daily. Apply to upper arm daily      zolpidem 10 MG tablet   Commonly known as: AMBIEN   Take 10 mg by mouth at bedtime as needed. For sleep           Follow-up Information    Follow up with [name], MD. Schedule an appointment as soon as possible for a visit in 2 weeks.   Contact information:   [facility] [address], [address] [place] [id number]          Signed: Mickel Crow 04/29/2012, 8:21 AM

## 2012-04-30 LAB — CBC
HCT: 27.3 % — ABNORMAL LOW (ref 39.0–52.0)
MCHC: 35.9 g/dL (ref 30.0–36.0)
MCV: 87.8 fL (ref 78.0–100.0)
Platelets: 124 10*3/uL — ABNORMAL LOW (ref 150–400)
RDW: 12 % (ref 11.5–15.5)
WBC: 7.3 10*3/uL (ref 4.0–10.5)

## 2012-04-30 NOTE — Plan of Care (Signed)
Problem: Discharge Progression Outcomes Goal: Anticoagulant follow-up in place Outcome: Not Applicable Date Met:  [id number] xarelto

## 2012-04-30 NOTE — Progress Notes (Signed)
Notified PA @ 1030 re: pt's concern about left ankle pain at times related to old injury. Ankle is without edema, normal to physical assessment. No new orders. Pt instructed to notify MD if left ankle pain continues. Discharged from floor via w/c, spouse with pt. No changes in assessment. Yves Fodor, [employer]

## 2012-04-30 NOTE — Progress Notes (Signed)
Physical Therapy Treatment Patient Details Name: [name] MRN: [id number] DOB: 28-Aug-1945 Today's Date: 04/30/2012 Time: [id number] PT Time Calculation (min): 40 min  PT Assessment / Plan / Recommendation Comments on Treatment Session  Pt is progressing well with mobility. He ambulated 250' today with RW and supervision. REady to DC home from PT standpoint. Pt reporting L ankle pain with weightbearing. No edema noted L ankle, strength/AROM WNL L ankle. Pt reports prior ankle fx 40 yrs ago. RN notified.    Follow Up Recommendations  Home health PT    Barriers to Discharge        Equipment Recommendations  None recommended by PT    Recommendations for Other Services    Frequency 7X/week   Plan Discharge plan remains appropriate;Frequency remains appropriate    Precautions / Restrictions Precautions Precautions: Knee Precaution Comments: instructed pt on KI use for ambulation Required Braces or Orthoses: Knee Immobilizer - Right Knee Immobilizer - Right: Discontinue once straight leg raise with < 10 degree lag Restrictions Weight Bearing Restrictions: No Other Position/Activity Restrictions: WBAT   Pertinent Vitals/Pain *5/10 R knee, 5/10 L ankle with walking Ice applied to R knee, pt premedicated RN notified of L ankle pain**    Mobility  Bed Mobility Bed Mobility: Supine to Sit Supine to Sit: 4: Min assist Details for Bed Mobility Assistance: Min assist to support R LE  Transfers Transfers: Sit to Stand;Stand to Sit Sit to Stand: 5: Supervision;From bed Stand to Sit: To chair/3-in-1;5: Supervision Details for Transfer Assistance: 25% VC's on proper tech, hand placement and to extend R LE prior to sit Ambulation/Gait Ambulation/Gait Assistance: 5: Supervision Ambulation Distance (Feet): 250 Feet Assistive device: Rolling walker Gait Pattern: Step-to pattern;Trunk flexed Gait velocity: decreased General Gait Details: VCs to breathe    Exercises Total Joint  Exercises Ankle Circles/Pumps: AROM;Both;10 reps;Supine Quad Sets: AROM;Both;10 reps;Supine Heel Slides: AAROM;Right;Supine;15 reps   PT Diagnosis:    PT Problem List:   PT Treatment Interventions:     PT Goals Acute Rehab PT Goals PT Goal Formulation: With patient Time For Goal Achievement: 04/30/12 Potential to Achieve Goals: Good Pt will go Supine/Side to Sit: with supervision PT Goal: Supine/Side to Sit - Progress: Progressing toward goal Pt will go Sit to Supine/Side: with supervision Pt will go Sit to Stand: with supervision PT Goal: Sit to Stand - Progress: Met Pt will go Stand to Sit: with supervision PT Goal: Stand to Sit - Progress: Met Pt will Ambulate: >150 feet;with supervision;with rolling walker PT Goal: Ambulate - Progress: Met  Visit Information  Last PT Received On: 04/30/12 Assistance Needed: +1    Subjective Data  Subjective: My  left ankle is hurting. 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 Patient St[place]   Cognition  Overall Cognitive Status: Appears within functional limits for tasks assessed/performed Arousal/Alertness: Awake/alert Orientation Level: Appears intact for tasks assessed Behavior During Session: [facility] for tasks performed    Balance     End of Session PT - End of Session Equipment Utilized During Treatment: Gait belt Activity Tolerance: Patient tolerated treatment well Patient left: in chair;with call bell/phone within reach;with nursing in room Nurse Communication: Mobility status CPM Right Knee CPM Right Knee: Off   GP     Blondell Reveal Kistler 04/30/2012, 9:50 AM [phone]

## 2012-04-30 NOTE — Progress Notes (Signed)
Orthopedics Progress Note  Subjective: Pt doing fairly well Mild to moderate pain to right knee today Denies any other symptoms  Objective:  Filed Vitals:   04/30/12 0511  BP: 135/76  Pulse: 69  Temp: 98.5 F (36.9 C)  Resp: 17    General: Awake and alert  Musculoskeletal: right knee incision healing well, dressing changed, nv intact distally Neurovascularly intact  Lab Results  Component Value Date   WBC 7.3 04/30/2012   HGB 9.8* 04/30/2012   HCT 27.3* 04/30/2012   MCV 87.8 04/30/2012   PLT 124* 04/30/2012       Component Value Date/Time   NA 135 04/29/2012 0410   K 3.7 04/29/2012 0410   CL 98 04/29/2012 0410   CO2 27 04/29/2012 0410   GLUCOSE 152* 04/29/2012 0410   BUN 9 04/29/2012 0410   CREATININE 0.92 04/29/2012 0410   CALCIUM 8.6 04/29/2012 0410   GFRNONAA 86* 04/29/2012 0410   GFRAA >90 04/29/2012 0410    Lab Results  Component Value Date   INR 0.99 04/20/2012   INR 1.0 01/01/2012   INR 1.0 ratio 06/06/2010    Assessment/Plan: POD #3 s/p Procedure(s):right CONVERSION TO TOTAL KNEE  Plan to d/c home today after therapy F/u in 2 weeks  Doran Heater. Veverly Fells, MD 04/30/2012 7:35 AM

## 2012-05-01 DIAGNOSIS — I509 Heart failure, unspecified: Secondary | ICD-10-CM | POA: Diagnosis not present

## 2012-05-01 DIAGNOSIS — M1710 Unilateral primary osteoarthritis, unspecified knee: Secondary | ICD-10-CM | POA: Diagnosis not present

## 2012-05-01 DIAGNOSIS — Z4789 Encounter for other orthopedic aftercare: Secondary | ICD-10-CM | POA: Diagnosis not present

## 2012-05-01 DIAGNOSIS — E119 Type 2 diabetes mellitus without complications: Secondary | ICD-10-CM | POA: Diagnosis not present

## 2012-05-01 DIAGNOSIS — IMO0001 Reserved for inherently not codable concepts without codable children: Secondary | ICD-10-CM | POA: Diagnosis not present

## 2012-05-01 DIAGNOSIS — Z96659 Presence of unspecified artificial knee joint: Secondary | ICD-10-CM | POA: Diagnosis not present

## 2012-05-02 DIAGNOSIS — E119 Type 2 diabetes mellitus without complications: Secondary | ICD-10-CM | POA: Diagnosis not present

## 2012-05-02 DIAGNOSIS — IMO0001 Reserved for inherently not codable concepts without codable children: Secondary | ICD-10-CM | POA: Diagnosis not present

## 2012-05-02 DIAGNOSIS — Z96659 Presence of unspecified artificial knee joint: Secondary | ICD-10-CM | POA: Diagnosis not present

## 2012-05-02 DIAGNOSIS — I509 Heart failure, unspecified: Secondary | ICD-10-CM | POA: Diagnosis not present

## 2012-05-02 DIAGNOSIS — Z4789 Encounter for other orthopedic aftercare: Secondary | ICD-10-CM | POA: Diagnosis not present

## 2012-05-03 DIAGNOSIS — I509 Heart failure, unspecified: Secondary | ICD-10-CM | POA: Diagnosis not present

## 2012-05-03 DIAGNOSIS — IMO0001 Reserved for inherently not codable concepts without codable children: Secondary | ICD-10-CM | POA: Diagnosis not present

## 2012-05-03 DIAGNOSIS — Z4789 Encounter for other orthopedic aftercare: Secondary | ICD-10-CM | POA: Diagnosis not present

## 2012-05-03 DIAGNOSIS — E119 Type 2 diabetes mellitus without complications: Secondary | ICD-10-CM | POA: Diagnosis not present

## 2012-05-03 DIAGNOSIS — Z96659 Presence of unspecified artificial knee joint: Secondary | ICD-10-CM | POA: Diagnosis not present

## 2012-05-04 DIAGNOSIS — E119 Type 2 diabetes mellitus without complications: Secondary | ICD-10-CM | POA: Diagnosis not present

## 2012-05-04 DIAGNOSIS — Z4789 Encounter for other orthopedic aftercare: Secondary | ICD-10-CM | POA: Diagnosis not present

## 2012-05-04 DIAGNOSIS — IMO0001 Reserved for inherently not codable concepts without codable children: Secondary | ICD-10-CM | POA: Diagnosis not present

## 2012-05-04 DIAGNOSIS — I509 Heart failure, unspecified: Secondary | ICD-10-CM | POA: Diagnosis not present

## 2012-05-04 DIAGNOSIS — Z96659 Presence of unspecified artificial knee joint: Secondary | ICD-10-CM | POA: Diagnosis not present

## 2012-05-06 DIAGNOSIS — I509 Heart failure, unspecified: Secondary | ICD-10-CM | POA: Diagnosis not present

## 2012-05-06 DIAGNOSIS — E119 Type 2 diabetes mellitus without complications: Secondary | ICD-10-CM | POA: Diagnosis not present

## 2012-05-06 DIAGNOSIS — Z96659 Presence of unspecified artificial knee joint: Secondary | ICD-10-CM | POA: Diagnosis not present

## 2012-05-06 DIAGNOSIS — IMO0001 Reserved for inherently not codable concepts without codable children: Secondary | ICD-10-CM | POA: Diagnosis not present

## 2012-05-06 DIAGNOSIS — Z4789 Encounter for other orthopedic aftercare: Secondary | ICD-10-CM | POA: Diagnosis not present

## 2012-05-09 DIAGNOSIS — IMO0001 Reserved for inherently not codable concepts without codable children: Secondary | ICD-10-CM | POA: Diagnosis not present

## 2012-05-09 DIAGNOSIS — Z4789 Encounter for other orthopedic aftercare: Secondary | ICD-10-CM | POA: Diagnosis not present

## 2012-05-09 DIAGNOSIS — I509 Heart failure, unspecified: Secondary | ICD-10-CM | POA: Diagnosis not present

## 2012-05-09 DIAGNOSIS — E119 Type 2 diabetes mellitus without complications: Secondary | ICD-10-CM | POA: Diagnosis not present

## 2012-05-09 DIAGNOSIS — Z96659 Presence of unspecified artificial knee joint: Secondary | ICD-10-CM | POA: Diagnosis not present

## 2012-05-10 DIAGNOSIS — Z4789 Encounter for other orthopedic aftercare: Secondary | ICD-10-CM | POA: Diagnosis not present

## 2012-05-10 DIAGNOSIS — IMO0001 Reserved for inherently not codable concepts without codable children: Secondary | ICD-10-CM | POA: Diagnosis not present

## 2012-05-10 DIAGNOSIS — I509 Heart failure, unspecified: Secondary | ICD-10-CM | POA: Diagnosis not present

## 2012-05-10 DIAGNOSIS — Z96659 Presence of unspecified artificial knee joint: Secondary | ICD-10-CM | POA: Diagnosis not present

## 2012-05-10 DIAGNOSIS — E119 Type 2 diabetes mellitus without complications: Secondary | ICD-10-CM | POA: Diagnosis not present

## 2012-05-11 DIAGNOSIS — Z4789 Encounter for other orthopedic aftercare: Secondary | ICD-10-CM | POA: Diagnosis not present

## 2012-05-11 DIAGNOSIS — I509 Heart failure, unspecified: Secondary | ICD-10-CM | POA: Diagnosis not present

## 2012-05-11 DIAGNOSIS — E119 Type 2 diabetes mellitus without complications: Secondary | ICD-10-CM | POA: Diagnosis not present

## 2012-05-11 DIAGNOSIS — IMO0001 Reserved for inherently not codable concepts without codable children: Secondary | ICD-10-CM | POA: Diagnosis not present

## 2012-05-11 DIAGNOSIS — Z96659 Presence of unspecified artificial knee joint: Secondary | ICD-10-CM | POA: Diagnosis not present

## 2012-05-12 DIAGNOSIS — E119 Type 2 diabetes mellitus without complications: Secondary | ICD-10-CM | POA: Diagnosis not present

## 2012-05-12 DIAGNOSIS — Z96659 Presence of unspecified artificial knee joint: Secondary | ICD-10-CM | POA: Diagnosis not present

## 2012-05-12 DIAGNOSIS — IMO0001 Reserved for inherently not codable concepts without codable children: Secondary | ICD-10-CM | POA: Diagnosis not present

## 2012-05-12 DIAGNOSIS — I509 Heart failure, unspecified: Secondary | ICD-10-CM | POA: Diagnosis not present

## 2012-05-12 DIAGNOSIS — Z4789 Encounter for other orthopedic aftercare: Secondary | ICD-10-CM | POA: Diagnosis not present

## 2012-05-13 DIAGNOSIS — Z4789 Encounter for other orthopedic aftercare: Secondary | ICD-10-CM | POA: Diagnosis not present

## 2012-05-13 DIAGNOSIS — IMO0001 Reserved for inherently not codable concepts without codable children: Secondary | ICD-10-CM | POA: Diagnosis not present

## 2012-05-13 DIAGNOSIS — E119 Type 2 diabetes mellitus without complications: Secondary | ICD-10-CM | POA: Diagnosis not present

## 2012-05-13 DIAGNOSIS — I509 Heart failure, unspecified: Secondary | ICD-10-CM | POA: Diagnosis not present

## 2012-05-13 DIAGNOSIS — Z96659 Presence of unspecified artificial knee joint: Secondary | ICD-10-CM | POA: Diagnosis not present

## 2012-05-16 DIAGNOSIS — Z4789 Encounter for other orthopedic aftercare: Secondary | ICD-10-CM | POA: Diagnosis not present

## 2012-05-16 DIAGNOSIS — IMO0001 Reserved for inherently not codable concepts without codable children: Secondary | ICD-10-CM | POA: Diagnosis not present

## 2012-05-16 DIAGNOSIS — I509 Heart failure, unspecified: Secondary | ICD-10-CM | POA: Diagnosis not present

## 2012-05-16 DIAGNOSIS — E119 Type 2 diabetes mellitus without complications: Secondary | ICD-10-CM | POA: Diagnosis not present

## 2012-05-16 DIAGNOSIS — Z96659 Presence of unspecified artificial knee joint: Secondary | ICD-10-CM | POA: Diagnosis not present

## 2012-05-18 DIAGNOSIS — Z4789 Encounter for other orthopedic aftercare: Secondary | ICD-10-CM | POA: Diagnosis not present

## 2012-05-18 DIAGNOSIS — I509 Heart failure, unspecified: Secondary | ICD-10-CM | POA: Diagnosis not present

## 2012-05-18 DIAGNOSIS — Z96659 Presence of unspecified artificial knee joint: Secondary | ICD-10-CM | POA: Diagnosis not present

## 2012-05-18 DIAGNOSIS — E119 Type 2 diabetes mellitus without complications: Secondary | ICD-10-CM | POA: Diagnosis not present

## 2012-05-18 DIAGNOSIS — IMO0001 Reserved for inherently not codable concepts without codable children: Secondary | ICD-10-CM | POA: Diagnosis not present

## 2012-05-20 DIAGNOSIS — Z96659 Presence of unspecified artificial knee joint: Secondary | ICD-10-CM | POA: Diagnosis not present

## 2012-05-20 DIAGNOSIS — Z4789 Encounter for other orthopedic aftercare: Secondary | ICD-10-CM | POA: Diagnosis not present

## 2012-05-20 DIAGNOSIS — E119 Type 2 diabetes mellitus without complications: Secondary | ICD-10-CM | POA: Diagnosis not present

## 2012-05-20 DIAGNOSIS — I509 Heart failure, unspecified: Secondary | ICD-10-CM | POA: Diagnosis not present

## 2012-05-20 DIAGNOSIS — IMO0001 Reserved for inherently not codable concepts without codable children: Secondary | ICD-10-CM | POA: Diagnosis not present

## 2012-05-23 DIAGNOSIS — E119 Type 2 diabetes mellitus without complications: Secondary | ICD-10-CM | POA: Diagnosis not present

## 2012-05-23 DIAGNOSIS — Z4789 Encounter for other orthopedic aftercare: Secondary | ICD-10-CM | POA: Diagnosis not present

## 2012-05-23 DIAGNOSIS — IMO0001 Reserved for inherently not codable concepts without codable children: Secondary | ICD-10-CM | POA: Diagnosis not present

## 2012-05-23 DIAGNOSIS — I509 Heart failure, unspecified: Secondary | ICD-10-CM | POA: Diagnosis not present

## 2012-05-23 DIAGNOSIS — Z96659 Presence of unspecified artificial knee joint: Secondary | ICD-10-CM | POA: Diagnosis not present

## 2012-05-25 DIAGNOSIS — Z96659 Presence of unspecified artificial knee joint: Secondary | ICD-10-CM | POA: Diagnosis not present

## 2012-05-25 DIAGNOSIS — E119 Type 2 diabetes mellitus without complications: Secondary | ICD-10-CM | POA: Diagnosis not present

## 2012-05-25 DIAGNOSIS — Z4789 Encounter for other orthopedic aftercare: Secondary | ICD-10-CM | POA: Diagnosis not present

## 2012-05-25 DIAGNOSIS — IMO0001 Reserved for inherently not codable concepts without codable children: Secondary | ICD-10-CM | POA: Diagnosis not present

## 2012-05-25 DIAGNOSIS — I509 Heart failure, unspecified: Secondary | ICD-10-CM | POA: Diagnosis not present

## 2012-05-27 DIAGNOSIS — IMO0001 Reserved for inherently not codable concepts without codable children: Secondary | ICD-10-CM | POA: Diagnosis not present

## 2012-05-27 DIAGNOSIS — Z96659 Presence of unspecified artificial knee joint: Secondary | ICD-10-CM | POA: Diagnosis not present

## 2012-05-27 DIAGNOSIS — I509 Heart failure, unspecified: Secondary | ICD-10-CM | POA: Diagnosis not present

## 2012-05-27 DIAGNOSIS — E119 Type 2 diabetes mellitus without complications: Secondary | ICD-10-CM | POA: Diagnosis not present

## 2012-05-27 DIAGNOSIS — Z4789 Encounter for other orthopedic aftercare: Secondary | ICD-10-CM | POA: Diagnosis not present

## 2012-05-31 DIAGNOSIS — Z96659 Presence of unspecified artificial knee joint: Secondary | ICD-10-CM | POA: Diagnosis not present

## 2012-05-31 DIAGNOSIS — M1710 Unilateral primary osteoarthritis, unspecified knee: Secondary | ICD-10-CM | POA: Diagnosis not present

## 2012-06-02 DIAGNOSIS — M1710 Unilateral primary osteoarthritis, unspecified knee: Secondary | ICD-10-CM | POA: Diagnosis not present

## 2012-06-07 DIAGNOSIS — M1710 Unilateral primary osteoarthritis, unspecified knee: Secondary | ICD-10-CM | POA: Diagnosis not present

## 2012-06-09 DIAGNOSIS — M1710 Unilateral primary osteoarthritis, unspecified knee: Secondary | ICD-10-CM | POA: Diagnosis not present

## 2012-06-13 DIAGNOSIS — M1710 Unilateral primary osteoarthritis, unspecified knee: Secondary | ICD-10-CM | POA: Diagnosis not present

## 2012-06-15 DIAGNOSIS — M1710 Unilateral primary osteoarthritis, unspecified knee: Secondary | ICD-10-CM | POA: Diagnosis not present

## 2012-06-17 DIAGNOSIS — M1710 Unilateral primary osteoarthritis, unspecified knee: Secondary | ICD-10-CM | POA: Diagnosis not present

## 2012-06-20 DIAGNOSIS — M1710 Unilateral primary osteoarthritis, unspecified knee: Secondary | ICD-10-CM | POA: Diagnosis not present

## 2012-06-22 DIAGNOSIS — M1710 Unilateral primary osteoarthritis, unspecified knee: Secondary | ICD-10-CM | POA: Diagnosis not present

## 2012-06-24 DIAGNOSIS — M1710 Unilateral primary osteoarthritis, unspecified knee: Secondary | ICD-10-CM | POA: Diagnosis not present

## 2012-06-27 DIAGNOSIS — M1710 Unilateral primary osteoarthritis, unspecified knee: Secondary | ICD-10-CM | POA: Diagnosis not present

## 2012-06-29 DIAGNOSIS — M1710 Unilateral primary osteoarthritis, unspecified knee: Secondary | ICD-10-CM | POA: Diagnosis not present

## 2012-07-01 DIAGNOSIS — M1710 Unilateral primary osteoarthritis, unspecified knee: Secondary | ICD-10-CM | POA: Diagnosis not present

## 2012-07-29 DIAGNOSIS — J069 Acute upper respiratory infection, unspecified: Secondary | ICD-10-CM | POA: Diagnosis not present

## 2012-08-19 ENCOUNTER — Emergency Department (HOSPITAL_COMMUNITY): Payer: Medicare Other

## 2012-08-19 ENCOUNTER — Encounter (HOSPITAL_COMMUNITY): Payer: Self-pay | Admitting: *Deleted

## 2012-08-19 ENCOUNTER — Observation Stay (HOSPITAL_COMMUNITY)
Admission: EM | Admit: 2012-08-19 | Discharge: 2012-08-20 | Disposition: A | Discharge status: Home / Self Care | Payer: Medicare Other | Attending: Cardiology | Admitting: Cardiology

## 2012-08-19 DIAGNOSIS — Z7982 Long term (current) use of aspirin: Secondary | ICD-10-CM | POA: Diagnosis not present

## 2012-08-19 DIAGNOSIS — Z96659 Presence of unspecified artificial knee joint: Secondary | ICD-10-CM | POA: Insufficient documentation

## 2012-08-19 DIAGNOSIS — Z8673 Personal history of transient ischemic attack (TIA), and cerebral infarction without residual deficits: Secondary | ICD-10-CM | POA: Diagnosis not present

## 2012-08-19 DIAGNOSIS — R079 Chest pain, unspecified: Secondary | ICD-10-CM

## 2012-08-19 DIAGNOSIS — Z79899 Other long term (current) drug therapy: Secondary | ICD-10-CM | POA: Diagnosis not present

## 2012-08-19 DIAGNOSIS — E7800 Pure hypercholesterolemia, unspecified: Secondary | ICD-10-CM

## 2012-08-19 DIAGNOSIS — F329 Major depressive disorder, single episode, unspecified: Secondary | ICD-10-CM | POA: Diagnosis not present

## 2012-08-19 DIAGNOSIS — I509 Heart failure, unspecified: Secondary | ICD-10-CM | POA: Insufficient documentation

## 2012-08-19 DIAGNOSIS — I10 Essential (primary) hypertension: Secondary | ICD-10-CM | POA: Diagnosis not present

## 2012-08-19 DIAGNOSIS — G4730 Sleep apnea, unspecified: Secondary | ICD-10-CM | POA: Insufficient documentation

## 2012-08-19 DIAGNOSIS — Z23 Encounter for immunization: Secondary | ICD-10-CM | POA: Insufficient documentation

## 2012-08-19 DIAGNOSIS — I5033 Acute on chronic diastolic (congestive) heart failure: Secondary | ICD-10-CM | POA: Diagnosis not present

## 2012-08-19 DIAGNOSIS — F411 Generalized anxiety disorder: Secondary | ICD-10-CM | POA: Diagnosis not present

## 2012-08-19 DIAGNOSIS — K219 Gastro-esophageal reflux disease without esophagitis: Secondary | ICD-10-CM | POA: Insufficient documentation

## 2012-08-19 DIAGNOSIS — E119 Type 2 diabetes mellitus without complications: Secondary | ICD-10-CM | POA: Insufficient documentation

## 2012-08-19 DIAGNOSIS — R0789 Other chest pain: Secondary | ICD-10-CM | POA: Diagnosis not present

## 2012-08-19 DIAGNOSIS — E785 Hyperlipidemia, unspecified: Secondary | ICD-10-CM | POA: Diagnosis not present

## 2012-08-19 DIAGNOSIS — M1710 Unilateral primary osteoarthritis, unspecified knee: Secondary | ICD-10-CM | POA: Diagnosis not present

## 2012-08-19 DIAGNOSIS — Z7902 Long term (current) use of antithrombotics/antiplatelets: Secondary | ICD-10-CM | POA: Diagnosis not present

## 2012-08-19 DIAGNOSIS — J9819 Other pulmonary collapse: Secondary | ICD-10-CM | POA: Diagnosis not present

## 2012-08-19 DIAGNOSIS — J811 Chronic pulmonary edema: Secondary | ICD-10-CM | POA: Diagnosis not present

## 2012-08-19 DIAGNOSIS — I2510 Atherosclerotic heart disease of native coronary artery without angina pectoris: Secondary | ICD-10-CM | POA: Diagnosis not present

## 2012-08-19 DIAGNOSIS — F3289 Other specified depressive episodes: Secondary | ICD-10-CM | POA: Insufficient documentation

## 2012-08-19 HISTORY — DX: Acute myocardial infarction, unspecified: I21.9

## 2012-08-19 LAB — CREATININE, SERUM
GFR calc Af Amer: 87 mL/min — ABNORMAL LOW (ref >90)
GFR calc non Af Amer: 75 mL/min — ABNORMAL LOW (ref >90)

## 2012-08-19 LAB — POCT I-STAT, CHEM 8
Calcium, Ion: 1.2 mmol/L (ref 1.13–1.30)
Creatinine, Ser: 1.1 mg/dL (ref 0.50–1.35)
Glucose, Bld: 189 mg/dL — ABNORMAL HIGH (ref 70–99)
Hemoglobin: 13.3 g/dL (ref 13.0–17.0)
TCO2: 25 mmol/L (ref 0–100)

## 2012-08-19 LAB — CBC
HCT: 38.7 % — ABNORMAL LOW (ref 39.0–52.0)
Hemoglobin: 13.4 g/dL (ref 13.0–17.0)
MCH: 30.5 pg (ref 26.0–34.0)
MCHC: 34.6 g/dL (ref 30.0–36.0)
Platelets: 172 10*3/uL (ref 150–400)
RBC: 4.4 MIL/uL (ref 4.22–5.81)
RDW: 13.5 % (ref 11.5–15.5)
WBC: 5.9 10*3/uL (ref 4.0–10.5)

## 2012-08-19 LAB — GLUCOSE, CAPILLARY: Glucose-Capillary: 103 mg/dL — ABNORMAL HIGH (ref 70–99)

## 2012-08-19 LAB — CK TOTAL AND CKMB (NOT AT ARMC): Total CK: 41 U/L (ref 7–232)

## 2012-08-19 LAB — TROPONIN I: Troponin I: <0.3 ng/mL (ref <0.30)

## 2012-08-19 MED ORDER — ONDANSETRON HCL 4 MG/2ML IJ SOLN: 4.0000 mg | Freq: Four times a day (QID) | INTRAMUSCULAR | Status: DC | PRN

## 2012-08-19 MED ORDER — INSULIN ASPART 100 UNIT/ML ~~LOC~~ SOLN
0.0000 [IU] | Freq: Three times a day (TID) | SUBCUTANEOUS | Status: DC
  Administered 2012-08-20: 2 [IU] via SUBCUTANEOUS
  Administered 2012-08-20: 3 [IU] via SUBCUTANEOUS

## 2012-08-19 MED ORDER — HEPARIN SODIUM (PORCINE) 5000 UNIT/ML IJ SOLN
5000.0000 [IU] | Freq: Three times a day (TID) | INTRAMUSCULAR | Status: DC
  Administered 2012-08-19 – 2012-08-20 (×3): 5000 [IU] via SUBCUTANEOUS
  Filled 2012-08-19 (×6): qty 1

## 2012-08-19 MED ORDER — ASPIRIN EC 81 MG PO TBEC
81.0000 mg | DELAYED_RELEASE_TABLET | Freq: Every day | ORAL | Status: DC
  Administered 2012-08-19: 81 mg via ORAL
  Filled 2012-08-19 (×2): qty 1

## 2012-08-19 MED ORDER — MORPHINE SULFATE 4 MG/ML IJ SOLN: 4.0000 mg | Freq: Once | INTRAMUSCULAR | Status: DC

## 2012-08-19 MED ORDER — ONDANSETRON HCL 4 MG/2ML IJ SOLN: 4.0000 mg | Freq: Once | INTRAMUSCULAR | Status: DC

## 2012-08-19 MED ORDER — HYDROCODONE-ACETAMINOPHEN 10-325 MG PO TABS: 1.0000 | ORAL_TABLET | Freq: Four times a day (QID) | ORAL | Status: DC | PRN

## 2012-08-19 MED ORDER — ATORVASTATIN CALCIUM 10 MG PO TABS
10.0000 mg | ORAL_TABLET | Freq: Every day | ORAL | Status: DC
  Administered 2012-08-19: 10 mg via ORAL
  Filled 2012-08-19 (×2): qty 1

## 2012-08-19 MED ORDER — SODIUM CHLORIDE 0.9 % IJ SOLN
3.0000 mL | Freq: Two times a day (BID) | INTRAMUSCULAR | Status: DC
  Administered 2012-08-19 – 2012-08-20 (×3): 3 mL via INTRAVENOUS

## 2012-08-19 MED ORDER — ZOLPIDEM TARTRATE 5 MG PO TABS: 5.0000 mg | ORAL_TABLET | Freq: Every evening | ORAL | Status: DC | PRN

## 2012-08-19 MED ORDER — NITROGLYCERIN 0.4 MG SL SUBL: 0.4000 mg | SUBLINGUAL_TABLET | SUBLINGUAL | Status: DC | PRN

## 2012-08-19 MED ORDER — CARVEDILOL 12.5 MG PO TABS
12.5000 mg | ORAL_TABLET | Freq: Two times a day (BID) | ORAL | Status: DC
  Administered 2012-08-19 – 2012-08-20 (×2): 12.5 mg via ORAL
  Filled 2012-08-19 (×4): qty 1

## 2012-08-19 MED ORDER — SODIUM CHLORIDE 0.9 % IV SOLN: 250.0000 mL | INTRAVENOUS | Status: DC | PRN

## 2012-08-19 MED ORDER — SODIUM CHLORIDE 0.9 % IJ SOLN: 3.0000 mL | INTRAMUSCULAR | Status: DC | PRN

## 2012-08-19 MED ORDER — FUROSEMIDE 10 MG/ML IJ SOLN
40.0000 mg | Freq: Once | INTRAMUSCULAR | Status: AC
  Administered 2012-08-19: 40 mg via INTRAVENOUS
  Filled 2012-08-19: qty 4

## 2012-08-19 MED ORDER — PANTOPRAZOLE SODIUM 20 MG PO TBEC
20.0000 mg | DELAYED_RELEASE_TABLET | Freq: Every day | ORAL | Status: DC
  Administered 2012-08-19 – 2012-08-20 (×2): 20 mg via ORAL
  Filled 2012-08-19 (×2): qty 1

## 2012-08-19 MED ORDER — POTASSIUM CHLORIDE CRYS ER 20 MEQ PO TBCR
20.0000 meq | EXTENDED_RELEASE_TABLET | Freq: Every day | ORAL | Status: DC
  Administered 2012-08-19: 20 meq via ORAL
  Filled 2012-08-19 (×2): qty 1

## 2012-08-19 MED ORDER — INFLUENZA VIRUS VACC SPLIT PF IM SUSP
0.5000 mL | INTRAMUSCULAR | Status: AC
  Administered 2012-08-20: 0.5 mL via INTRAMUSCULAR
  Filled 2012-08-19: qty 0.5

## 2012-08-19 MED ORDER — NITROGLYCERIN 0.4 MG SL SUBL
0.4000 mg | SUBLINGUAL_TABLET | SUBLINGUAL | Status: AC | PRN
  Administered 2012-08-19 (×3): 0.4 mg via SUBLINGUAL
  Filled 2012-08-19: qty 75

## 2012-08-19 MED ORDER — FUROSEMIDE 40 MG PO TABS
40.0000 mg | ORAL_TABLET | Freq: Every day | ORAL | Status: DC
  Administered 2012-08-20: 40 mg via ORAL
  Filled 2012-08-19: qty 1

## 2012-08-19 MED ORDER — ACETAMINOPHEN 325 MG PO TABS: 650.0000 mg | ORAL_TABLET | ORAL | Status: DC | PRN

## 2012-08-19 NOTE — H&P (Signed)
History and Physical  Patient ID: [name] MRN: [id number], SOB: July 29, 1946 66 y.o. Date of Encounter: 08/19/2012, 10:17 AM  Primary Physician: [name], MD Primary Cardiologist: Dr. Aundra Dubin  Chief Complaint: Chest pain  HPI: 66 y.o. male w/ PMHx significant for CAD (s/p 3v CABG '95, PCI to prox D1 '96, last cath 12/2011 patent grafts w/ 90% distal D1 stenosis not amenable to intervention), Chronic Diastolic CHF (EF 0000000), HTN, HLD, TIA, Carotid Artery Dz, DM, OSA, and obesity (s/p lab banding '10) who presented to [facility] on 08/19/2012 with complaints of chest pain.  Echo 2007 EF 55-60%. Last cath 12/2011 showed LAD diff dzs/small, Diag 75% isr, 90 dist to stent (small), LCX & RCA occluded, VG->OM 40, VG->PDA patent - med rx. During hospital follow up 12/2011 he complained of continued chest pain for which he was prescribed 30mg  Imdur, but never started it because he didn't have any further chest pain. Last evaluated by Dr. Aundra Dubin 03/2012 at which time he was without complaints of chest pain. Underwent total right knee replacement 04/2012.  He reports feeling well recently other than some cold symptoms (sore throat, cough) a few weeks ago. He has been walking frequently and climbing stairs without chest pain. On 12/25 he had severe indigestion after over eating which he says happens any time he eats too much due to his lap band. Last night while sitting down he felt "chest aches". Woke up at 4am with tightness and pressure in his chest. No shortness of breath, nausea, or diaphoresis. Felt pain in shoulders and back as well. It was unlike his "heart pain" that he has had in the past. He thought it was from heart failure bc he thinks he forgot to take his Lasix yesterday and has gained 8lbs over the last 3 days so he took a lasix. He also took NTG x3 without relief (thinks it was a very old prescription). In the ED his pain was relieved with oxygen and NTG. Denies sob,  orthopnea, LE edema, palpitations, syncope, abd pain, change in bladder/bowel habits, melena/hematochezia. No excessive salt or fluid intake.  In the ED EKG reveals NSR with no acute ST/T changes. CXR demonstrates mild vascular congestion, borderline cardiomegaly, and mild bilateral atelectasis. Labs are significant for normal poc troponin, pBNP 124, glucose 189, otherwise unremarkable BMET/CBC. He is chest pain free.   Past Medical History  Diagnosis Date  . Diastolic CHF, chronic     a. EF 55-60% by echo 2007  . Hypertension   . Coronary artery disease     a. s/p MI & CABG; b. s/p PCI Diag;  c. Cath 12/2011: LAD diff dzs/small, Diag 75% isr, 90 dist to stent (small), LCX & RCA occluded, VG->OM 40, VG->PDA patent - med rx.  . History of transient ischemic attack (TIA)     CAROTID DOPPLERS NOV 2011  0-39& BIL. STENOSIS  . Hyperlipidemia   . Allergic rhinitis   . History of diverticulitis of colon     5 YRS AGO  . Diabetes mellitus     ORAL MEDS  . 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 Anxiety   . Depression   . Neuromuscular disorder     left arm numbness   . GERD (gastroesophageal reflux disease)   . Cancer     hx of skin cancers   . Sleep apnea     cpap- see ov note in EPIC  05/12/11 for settings     12/2011 - Cardiac Cath Hemodynamics:  AO 136/66  LV 137/21  Coronary angiography:  Coronary dominance: right  Left mainstem: 20% distal left main stenosis.  Left anterior descending (LAD): The LAD gives off a moderate, branching 1st diagonal that is comparable in size to the continuation of the LAD. The LAD beyond D1 has no significant disease but is small in caliber. D1 has a stent just the take-off of a proximal branch. There is 75% stenosis in the proximal portion of the stent. Beyond the stent in the distal D1, there is a 90% stenosis. The vessel is small caliber at this point.  Left circumflex (LCx): Totally occluded proximally.  SVG-OM is patent with 40% distal graft stenosis. OM is patent after touchdown.  Right coronary artery (RCA): Total occlusion of the proximal RCA. SVG-PDA is patent. The PDA is patent after the touchdown and the graft backfills into a PLV.  Left ventriculography: Left ventricular systolic function is normal, LVEF is estimated at 55%. Basal inferior hypokinesis.  Final Conclusions: No change compared to prior cath in 2011. The grafts are patent. There is significant disease in a moderate diagonal but it is not amenable to intervention (90% distal diagonal stenosis, small caliber at that point).  Recommendations: Continue medical management.  2007 - Echo SUMMARY: - Overall left ventricular systolic function was normal. Left ventricular ejection fraction was estimated , range being 55% to 60%. This study was inadequate for the evaluation of left ventricular regional wall motion. - Aortic valve thickness was mildly increased. - The left atrium was mildly dilated. - The estimated peak pulmonary artery systolic pressure was mildly increased.    Surgical History:  Past Surgical History  Procedure Date  . Right knee ed compartment replacement 2004  . Laparoscopic gastric banding 01-15-09  . Knee arthroscopy w/ meniscectomy X2 IN 2002-- RIGHT KNEE  . Umbilical hernia repair [id number]    W/ GASTRIC BANDING PROCEDURE  . Coronary artery bypass graft 1995    X3 VESSEL  . Blepharoplasty 1985    BILATERAL  . Circumcision 35 YRS  AGO  . Knee arthroscopy 07/22/2011    Procedure: ARTHROSCOPY KNEE;  Surgeon: [name];  Location: [facility];  Service: Orthopedics;  Laterality: Right;  WITH DEBRIDEMENT   . Chondroplasty 07/22/2011    Procedure: CHONDROPLASTY;  Surgeon: [name];  Location: [facility];  Service: Orthopedics;;  . Synovectomy 07/22/2011    Procedure: SYNOVECTOMY;  Surgeon: Dione Plover Aluisio;  Location: [facility];  Service:  Orthopedics;;  . Coronary angioplasty 1996-- POST CABG    W/ STENT, last cath 01/07/2012   . Coronary stent placement      Home Meds: Medication Sig  aspirin 325 MG EC tablet Take 325 mg by mouth once.  aspirin EC 81 MG tablet Take 81 mg by mouth at bedtime.  carvedilol (COREG) 25 MG tablet Take 12.5 mg by mouth 2 (two) times daily with a meal. Take 1/2 tablet twice a day  furosemide (LASIX) 40 MG tablet Take 40 mg by mouth daily.  HYDROcodone-acetaminophen (NORCO) 10-325 MG per tablet Take 1 tablet by mouth every 6 (six) hours as needed. For pain  KLOR-CON M20 20 MEQ tablet Take 1 tablet by mouth daily.  lansoprazole (PREVACID) 30 MG capsule Take 15 mg by mouth daily as needed. As needed in the evening for indigestion  metFORMIN (GLUCOPHAGE) 500 MG tablet Take 500 mg by mouth 2 (two) times  daily with a meal.  nitroGLYCERIN (NITROSTAT) 0.4 MG SL tablet Place 1 tablet (0.4 mg total) under the tongue every 5 (five) minutes as needed for chest pain.  Atorvastatin (LIPITOR) 20 MG tablet Take 10 mg by mouth every evening.  Testosterone 20.25 MG/ACT (1.62%) GEL Apply 2 Squirts topically daily. Apply to upper arm daily  Vitamin D, Ergocalciferol, (DRISDOL) 50000 UNITS CAPS Take 50,000 Units by mouth every 14 (fourteen) days. Every other Tuesday  ipratropium (ATROVENT) 0.03 % nasal spray Place 2 sprays into the nose 4 (four) times daily as needed. As needed    Allergies:  Allergies  Allergen Reactions  . Codeine Other (See Comments)    Extremity tingling-- can take synthetic    History   Social History  . Marital Status: Married    Spouse Name: N/A    Number of Children: N/A  . Years of Education: N/A   Occupational History  . Not on file.   Social History Main Topics  . Smoking status: Never Smoker   . Smokeless tobacco: Never Used  . Alcohol Use: Yes     Comment: OCCASIONAL  . Drug Use: No  . Sexually Active: Not on file   Other Topics Concern  . Not on file   Social  History Narrative  . No narrative on file     Family history: Noncontributory  Review of Systems: General: (+) weight gain; negative for chills, fever, night sweats Cardiovascular: As per HPI Dermatological: negative for rash Respiratory: negative for cough or wheezing Urologic: negative for hematuria Abdominal: negative for nausea, vomiting, diarrhea, bright red blood per rectum, melena, or hematemesis Neurologic: negative for visual changes, syncope, or dizziness All other systems reviewed and are otherwise negative except as noted above.  Labs:   Component Value Date   WBC 6.7 08/19/2012   HGB 13.3 08/19/2012   HCT 39.0 08/19/2012   MCV 88.2 08/19/2012   PLT 177 08/19/2012    Lab 08/19/12 0548  NA 139  K 3.7  CL 105  CO2 25  BUN 8  CREATININE 1.10  GLUCOSE 189*     08/19/2012 05:46  Troponin i, poc 0.01     08/19/2012 08:17  Pro B Natriuretic peptide (BNP) 124.2    Radiology/Studies:   08/19/2012 - PORTABLE CHEST - 1 VIEW Findings: The lungs are well-aerated.  Mild bilateral atelectasis is noted.  Mild vascular congestion is seen.  There is no evidence of focal opacification, pleural effusion or pneumothorax.  The cardiomediastinal silhouette is borderline enlarged; the patient is status post median sternotomy, with evidence of prior CABG.  No acute osseous abnormalities are seen.  IMPRESSION: Mild vascular congestion and borderline cardiomegaly.  Mild bilateral atelectasis seen; lungs otherwise clear.     EKG: 08/19/12 @ 0525 - NSR with no acute ST/T changes compared to prior EKG  Physical Exam: Blood pressure 110/67, pulse 62, temperature 97.2 F (36.2 C), temperature source Oral, resp. rate 18, height 5\' 10"  (1.778 m), weight 222 lb (100.699 kg), SpO2 99.00%. General: Overweight white male in no acute distress. Head: Normocephalic, atraumatic, sclera non-icteric, nares are without discharge Neck: Supple. Negative for carotid bruits or JVD Lungs: Clear  bilaterally to auscultation without wheezes, rales, or rhonchi. Breathing is unlabored. Heart: RRR with S1 S2. No murmurs, rubs, or gallops appreciated. Abdomen: Soft, non-tender, non-distended with normoactive bowel sounds. No rebound/guarding. No obvious abdominal masses. Msk:  Strength and tone appear normal for age. Extremities: Trace bilat ankle edema. No clubbing or cyanosis. Distal  pedal pulses are intact and equal bilaterally. Neuro: Alert and oriented X 3. Moves all extremities spontaneously. Psych:  Responds to questions appropriately with a normal affect.    ASSESSMENT AND PLAN:  66 y.o. male w/ PMHx significant for CAD (s/p 3v CABG '95, PCI to prox D1 '96, last cath 12/2011 patent grafts w/ 90% distal D1 stenosis not amenable to intervention), Chronic Diastolic CHF (EF XX123456), HTN, HLD, TIA, Carotid Artery Dz, DM, OSA, and obesity (s/p lab banding '10) who presented to [facility] on 08/19/2012 with complaints of chest pain.  1. Acute on Chronic Diastolic CHF EF [id number] by cath 12/2011 2. Chest pain concerning for unstable angina 3. Coronary Artery Disease s/p 3v CABG '95, PCI to prox D1 '96, last cath 12/2011 patent grafts w/ 90% distal D1 stenosis not amenable to intervention 4. Hypertension 5. Hyperlipidemia 6. Diabetes mellitus, Type 2, Uncontrolled 7. Obesity s/p lap band 8. Cerebrovascular Disease 9. OSA on CPAP  Patient presents with chest pain that is concerning for unstable angina as well as an 8lb weight gain and CXR that shows vascular congestion. He reports "indigestion" a couple of times over the last week and an episode of chest pressure/tightness that woke him up this morning. He has known distal D1 disease not amenable to PCI. First poc troponin is normal and there are no acute EKG changes. BNP is normal and he is not overtly volume overloaded on exam. Given his cardiac history will place him in observation overnight, monitor on telemetry, cycle enzymes, and gently  diurese (40mg  IV lasix now, then resume oral home lasix tomorrow). If he rules out then he can be dc'd tomorrow with close follow up with Dr. Aundra Dubin. Consider addition of Imdur at dc (note he does take Cialis and Viagra occasionally for ED). If enzymes turn positive, recurrent chest pain, or EKG changes would consider repeat cath. Hold metformin, place on SSI, check A1C and lipid panel in am. Subq heparin for VTE prophylaxis.     Signed, HOPE, JESSICA PA-C 08/19/2012, 10:17 AM  Patient seen and examined.  He presented with chest pain occurring last night, then awoke him from sleep.  He ended up taking NGT times 5 (Never used before but did so at the request of his wife).  The pain subsided.  His weight is up ten pounds but he has had prior lap band surgery, and gets full easily so does not think he overate. He now feels good.  Prior angio report reviewed.  He would like to go home, but does agree to observation with ambulation over night to make sure he is safe for DC and close follow up.  He did take a furosemide.  Exam now reveals no obvious JVD.  Lungs are clear, and cardiac exam is unremarkable.  ECG suggests nonspecific T flattening, and CXR has mild congestion.  Enzyme neg so far.    Plan  1.  Serial enzymes 2.  Repeat ECG 3.  Ambulate in hall 4.  If high risk markers, then would cath, otherwise home tomorrow with early follow up with Dr. Aundra Dubin.    Discussed with patient and wife  (wife is OR Marine scientist at [employer])  Will hold off on Imdur given use of inhibitors.

## 2012-08-19 NOTE — ED Notes (Signed)
RN in with pt; family at bedside

## 2012-08-19 NOTE — ED Notes (Addendum)
Pt to ED c/o chest pain x 3 days.  He felt it was indigestion, but the pain has become too great.  Hx of 1 MI, triple bypass and 1 stent.  Took 3 0.4 sl nitro before leaving house and 2 enroute with no relief.  Also took 1 325 mg asa.

## 2012-08-19 NOTE — ED Notes (Signed)
Repeat EKG performed per Janett Billow, [facility] and handed to [place], MD Cardiology

## 2012-08-19 NOTE — ED Provider Notes (Signed)
History     CSN: [id number]  Arrival date & time 08/19/12  [id number]   First MD Initiated Contact with Patient 08/19/12 [phone]      Chief Complaint  Patient presents with  . Chest Pain    (Consider location/radiation/quality/duration/timing/severity/associated sxs/prior treatment) HPI History provided by patient. Has significant history of coronary artery disease followed by Dr. Aundra Dubin. Previous CABG about 20 years ago and also has a stent. Yesterday was feeling some indigestion but denies any significant chest pain. Sporting woke up with left-sided chest pressure. No associated shortness of breath, diaphoresis, did have some nausea. He took 3 nitroglycerin without relief and called EMS. He received aspirin and nitroglycerin in route which did relieve his pain. Symptoms moderate in severity/ patient states she has never had take nitroglycerin in the past. Had a cardiac catheterization a few months ago.   Past Medical History  Diagnosis Date  . Diastolic CHF, chronic     a. EF 55-60% by echo 2007  . Hypertension   . Coronary artery disease     a. s/p MI & CABG; b. s/p PCI Diag;  c. Cath 12/2011: LAD diff dzs/small, Diag 75% isr, 90 dist to stent (small), LCX & RCA occluded, VG->OM 40, VG->PDA patent - med rx.  . History of transient ischemic attack (TIA)     CAROTID DOPPLERS NOV 2011  0-39& BIL. STENOSIS  . Hyperlipidemia   . Allergic rhinitis   . History of diverticulitis of colon     5 YRS AGO  . Diabetes mellitus     ORAL MEDS  . 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 Anxiety   . Depression   . Neuromuscular disorder     left arm numbness   . GERD (gastroesophageal reflux disease)   . Cancer     hx of skin cancers   . Sleep apnea     cpap- see ov note in [facility] 05/12/11 for settings     Past Surgical History  Procedure Date  . Right knee ed compartment replacement 2004  . Laparoscopic gastric banding 01-15-09  . Knee arthroscopy  w/ meniscectomy X2 IN 2002-- RIGHT KNEE  . Umbilical hernia repair [id number]    W/ GASTRIC BANDING PROCEDURE  . Coronary artery bypass graft 1995    X3 VESSEL  . Blepharoplasty 1985    BILATERAL  . Circumcision 35 YRS  AGO  . Knee arthroscopy 07/22/2011    Procedure: ARTHROSCOPY KNEE;  Surgeon: [name];  Location: [place];  Service: Orthopedics;  Laterality: Right;  WITH DEBRIDEMENT   . Chondroplasty 07/22/2011    Procedure: CHONDROPLASTY;  Surgeon: [name];  Location: [place];  Service: Orthopedics;;  . Synovectomy 07/22/2011    Procedure: SYNOVECTOMY;  Surgeon: Dione Plover Aluisio;  Location: [place];  Service: Orthopedics;;  . Coronary angioplasty 1996-- POST CABG    W/ STENT, last cath 01/07/2012   . Coronary stent placement     No family history on file.  History  Substance Use Topics  . Smoking status: Never Smoker   . Smokeless tobacco: Never Used  . Alcohol Use: Yes     Comment: OCCASIONAL      Review of Systems  Constitutional: Negative for fever and chills.  HENT: Negative for neck pain and neck stiffness.   Eyes: Negative for pain.  Respiratory: Negative for shortness of breath.   Cardiovascular: Positive for  chest pain.  Gastrointestinal: Negative for vomiting and abdominal pain.  Genitourinary: Negative for dysuria.  Musculoskeletal: Negative for back pain.  Skin: Negative for rash.  Neurological: Negative for headaches.  All other systems reviewed and are negative.    Allergies  Codeine  Home Medications   Current Outpatient Rx  Name  Route  Sig  Dispense  Refill  . ASPIRIN 325 MG PO TBEC   Oral   Take 325 mg by mouth once.         . ASPIRIN EC 81 MG PO TBEC   Oral   Take 81 mg by mouth at bedtime.         Marland Kitchen CARVEDILOL 25 MG PO TABS   Oral   Take 12.5 mg by mouth 2 (two) times daily with a meal. Take 1/2 tablet twice a day         . FUROSEMIDE 40 MG PO TABS    Oral   Take 40 mg by mouth daily.         Marland Kitchen HYDROCODONE-ACETAMINOPHEN 10-325 MG PO TABS   Oral   Take 1 tablet by mouth every 6 (six) hours as needed. For pain         . KLOR-CON M20 20 MEQ PO TBCR   Oral   Take 1 tablet by mouth daily.         Marland Kitchen LANSOPRAZOLE 30 MG PO CPDR   Oral   Take 15 mg by mouth daily as needed. As needed in the evening for indigestion         . METFORMIN HCL 500 MG PO TABS   Oral   Take 500 mg by mouth 2 (two) times daily with a meal.         . NITROGLYCERIN 0.4 MG SL SUBL   Sublingual   Place 1 tablet (0.4 mg total) under the tongue every 5 (five) minutes as needed for chest pain.   25 tablet   3   . ROSUVASTATIN CALCIUM 10 MG PO TABS   Oral   Take 5 mg by mouth every evening.         . TESTOSTERONE 20.25 MG/ACT (1.62%) TD GEL   Topical   Apply 2 Squirts topically daily. Apply to upper arm daily         . VITAMIN D (ERGOCALCIFEROL) 50000 UNITS PO CAPS   Oral   Take 50,000 Units by mouth every 14 (fourteen) days. Every other Tuesday         . IPRATROPIUM BROMIDE 0.03 % NA SOLN   Nasal   Place 2 sprays into the nose 4 (four) times daily as needed. As needed           BP 115/80  Pulse 97  Temp 97.2 F (36.2 C) (Oral)  Resp 18  Ht 5\' 10"  (1.778 m)  Wt 222 lb (100.699 kg)  BMI 31.85 kg/m2  SpO2 97%  Physical Exam  Constitutional: He is oriented to person, place, and time. He appears well-developed and well-nourished.  HENT:  Head: Normocephalic and atraumatic.  Eyes: Conjunctivae normal and EOM are normal. Pupils are equal, round, and reactive to light.  Neck: Trachea normal. Neck supple. No thyromegaly present.  Cardiovascular: Normal rate, regular rhythm, S1 normal, S2 normal and normal pulses.     No systolic murmur is present   No diastolic murmur is present  Pulses:      Radial pulses are 2+ on the right side, and 2+ on the left  side.  Pulmonary/Chest: Effort normal and breath sounds normal. He has no wheezes.  He has no rhonchi. He has no rales. He exhibits no tenderness.  Abdominal: Soft. Normal appearance and bowel sounds are normal. There is no tenderness. There is no CVA tenderness and negative Murphy's sign.  Musculoskeletal:       BLE:s Calves nontender, no cords or erythema, negative Homans sign  Neurological: He is alert and oriented to person, place, and time. He has normal strength. No cranial nerve deficit or sensory deficit. GCS eye subscore is 4. GCS verbal subscore is 5. GCS motor subscore is 6.  Skin: Skin is warm and dry. No rash noted. He is not diaphoretic.  Psychiatric: His speech is normal.       Cooperative and appropriate    ED Course  Procedures (including critical care time)  Labs Reviewed  CBC - Abnormal; Notable for the following:    HCT 38.7 (*)     All other components within normal limits  POCT I-STAT, CHEM 8 - Abnormal; Notable for the following:    Glucose, Bld 189 (*)     All other components within normal limits  POCT I-STAT TROPONIN I   Dg Chest Portable 1 View  08/19/2012  *RADIOLOGY REPORT*  Clinical Data: Mid chest pain and back pain.  PORTABLE CHEST - 1 VIEW  Comparison: Chest radiograph performed 04/20/2012  Findings: The lungs are well-aerated.  Mild bilateral atelectasis is noted.  Mild vascular congestion is seen.  There is no evidence of focal opacification, pleural effusion or pneumothorax.  The cardiomediastinal silhouette is borderline enlarged; the patient is status post median sternotomy, with evidence of prior CABG.  No acute osseous abnormalities are seen.  IMPRESSION: Mild vascular congestion and borderline cardiomegaly.  Mild bilateral atelectasis seen; lungs otherwise clear.   Original Report Authenticated By: Santa Lighter, M.D.       Date: 08/19/2012  Rate: 76  Rhythm: normal sinus rhythm  QRS Axis: normal  Intervals: normal  ST/T Wave abnormalities: nonspecific ST changes  Conduction Disutrbances:none  Narrative Interpretation:  Normal sinus rhythm with artifact  Old EKG Reviewed: unchanged  Aspirin provided. Nitroglycerin prior to arrival  6:38 AM case discussed with Dr. Lia Foyer as above, will evaluate in emergency department  MDM   CP with significant cardiac history. He resolved with nitroglycerin. Aspirin nitroglycerin. He was evaluated his EKG, chest x-ray labs as above. Cardiology consult for ED evaluation        Teressa Lower, MD 08/19/12 0730

## 2012-08-19 NOTE — ED Notes (Signed)
Refusing Morphine and Zofran, patient denies pain and nausea wife at the bedside

## 2012-08-20 DIAGNOSIS — R079 Chest pain, unspecified: Secondary | ICD-10-CM | POA: Diagnosis not present

## 2012-08-20 LAB — BASIC METABOLIC PANEL
BUN: 10 mg/dL (ref 6–23)
Calcium: 9.3 mg/dL (ref 8.4–10.5)
Creatinine, Ser: 1.07 mg/dL (ref 0.50–1.35)
GFR calc non Af Amer: 70 mL/min — ABNORMAL LOW (ref >90)
Glucose, Bld: 135 mg/dL — ABNORMAL HIGH (ref 70–99)
Potassium: 3.4 mEq/L — ABNORMAL LOW (ref 3.5–5.1)

## 2012-08-20 LAB — LIPID PANEL
LDL Cholesterol: 59 mg/dL (ref 0–99)
Triglycerides: 284 mg/dL — ABNORMAL HIGH (ref <150)
VLDL: 57 mg/dL — ABNORMAL HIGH (ref 0–40)

## 2012-08-20 LAB — GLUCOSE, CAPILLARY: Glucose-Capillary: 123 mg/dL — ABNORMAL HIGH (ref 70–99)

## 2012-08-20 LAB — HEMOGLOBIN A1C: Hgb A1c MFr Bld: 6.5 % — ABNORMAL HIGH (ref <5.7)

## 2012-08-20 LAB — TROPONIN I: Troponin I: <0.3 ng/mL (ref <0.30)

## 2012-08-20 LAB — CK TOTAL AND CKMB (NOT AT ARMC)
CK, MB: 2.2 ng/mL (ref 0.3–4.0)
Relative Index: INVALID (ref 0.0–2.5)
Total CK: 40 U/L (ref 7–232)

## 2012-08-20 MED ORDER — POTASSIUM CHLORIDE CRYS ER 20 MEQ PO TBCR
40.0000 meq | EXTENDED_RELEASE_TABLET | Freq: Once | ORAL | Status: AC
  Administered 2012-08-20: 40 meq via ORAL
  Filled 2012-08-20: qty 1

## 2012-08-20 NOTE — Progress Notes (Signed)
Pt no longer uses CPAP.  Utilizes a jaw re-positioning device now.

## 2012-08-20 NOTE — Progress Notes (Signed)
Patient ID: [name], male   DOB: [birth year], 66 y.o.   MRN: [id number]   Patient Name: [name] Date of Encounter: 08/20/2012    SUBJECTIVE  Breathing much better with no further chest discomfort. Diuresis noted. Cardiac markers negative.  CURRENT MEDS    . aspirin EC  81 mg Oral QHS  . atorvastatin  10 mg Oral q1800  . carvedilol  12.5 mg Oral BID WC  . furosemide  40 mg Oral Daily  . heparin  5,000 Units Subcutaneous Q8H  . influenza  inactive virus vaccine  0.5 mL Intramuscular Tomorrow-1000  . insulin aspart  0-15 Units Subcutaneous TID WC  .  morphine injection  4 mg Intravenous Once  . ondansetron  4 mg Intravenous Once  . pantoprazole  20 mg Oral Daily  . potassium chloride SA  20 mEq Oral Daily  . sodium chloride  3 mL Intravenous Q12H    OBJECTIVE  Filed Vitals:   08/19/12 1505 08/19/12 2017 08/20/12 0156 08/20/12 0511  BP: 127/89 121/67 136/73 137/80  Pulse: 68 71 66 71  Temp: 98.1 F (36.7 C) 97.8 F (36.6 C)  97.8 F (36.6 C)  TempSrc: Oral Oral  Oral  Resp: 21 18 18 18   Height: 5\' 10"  (1.778 m)     Weight: 220 lb 9.6 oz (100.064 kg)   218 lb 3.2 oz (98.975 kg)  SpO2: 99% 99%  98%    Intake/Output Summary (Last 24 hours) at 08/20/12 1003 Last data filed at 08/20/12 0900  Gross per 24 hour  Intake    603 ml  Output   1725 ml  Net  -1122 ml   Filed Weights   08/19/12 0520 08/19/12 1505 08/20/12 0511  Weight: 222 lb (100.699 kg) 220 lb 9.6 oz (100.064 kg) 218 lb 3.2 oz (98.975 kg)    PHYSICAL EXAM  General: Pleasant, NAD. Neuro: Alert and oriented X 3. Moves all extremities spontaneously. Psych: Normal affect. HEENT:  Normal  Neck: Supple without bruits or JVD. Lungs:  Resp regular and unlabored, CTA. Heart: RRR no s3, s4, or murmurs. Abdomen: Soft, non-tender, non-distended, BS + x 4.  Extremities: No clubbing, cyanosis or edema. DP/PT/Radials 2+ and equal bilaterally.  Accessory Clinical Findings  CBC  Basename 08/19/12  1534 08/19/12 0548 08/19/12 0531  WBC 5.9 -- 6.7  NEUTROABS -- -- --  HGB 12.8* 13.3 --  HCT 39.2 39.0 --  MCV 89.1 -- 88.2  PLT 172 -- [id number]   Basic Metabolic Panel  Basename [id number] 08/19/12 1534 08/19/12 0548  NA 138 -- 139  K 3.4* -- 3.7  CL 100 -- 105  CO2 29 -- --  GLUCOSE 135* -- 189*  BUN 10 -- 8  CREATININE 1.07 1.01 --  CALCIUM 9.3 -- --  MG -- -- --  PHOS -- -- --   Liver Function Tests No results found for this basename: AST:2,ALT:2,ALKPHOS:2,BILITOT:2,PROT:2,ALBUMIN:2 in the last 72 hours No results found for this basename: LIPASE:2,AMYLASE:2 in the last 72 hours Cardiac Enzymes  Basename 08/20/12 0325 08/19/12 2038 08/19/12 1534  CKTOTAL 40 41 41  CKMB 2.2 2.4 2.7  CKMBINDEX -- -- --  TROPONINI <0.30 <0.30 <0.30   BNP No components found with this basename: POCBNP:3 D-Dimer No results found for this basename: DDIMER:2 in the last 72 hours Hemoglobin A1C No results found for this basename: HGBA1C in the last 72 hours Fasting Lipid Panel  Basename 08/20/12 0622  CHOL 153  HDL 37*  LDLCALC 59  TRIG 284*  CHOLHDL 4.1  LDLDIRECT --   Thyroid Function Tests No results found for this basename: TSH,T4TOTAL,FREET3,T3FREE,THYROIDAB in the last 72 hours  TELE  Normal sinus rhythm  ECG  Radiology/Studies  Dg Chest Portable 1 View  08/19/2012  *RADIOLOGY REPORT*  Clinical Data: Mid chest pain and back pain.  PORTABLE CHEST - 1 VIEW  Comparison: Chest radiograph performed 04/20/2012  Findings: The lungs are well-aerated.  Mild bilateral atelectasis is noted.  Mild vascular congestion is seen.  There is no evidence of focal opacification, pleural effusion or pneumothorax.  The cardiomediastinal silhouette is borderline enlarged; the patient is status post median sternotomy, with evidence of prior CABG.  No acute osseous abnormalities are seen.  IMPRESSION: Mild vascular congestion and borderline cardiomegaly.  Mild bilateral atelectasis seen; lungs  otherwise clear.   Original Report Authenticated By: Santa Lighter, M.D.     ASSESSMENT AND PLAN  Patient admitted with acute on chronic diastolic heart failure. He states that he missed a dose of his Lasix. He does weigh himself every day and noted that his weight was up 8 pounds. Since diuresis he feels better with no chest pain. Cardiac markers negative.  We'll plan on discharge today. We'll supplement potassium this morning and make sure he gets a.m. Lasix today. Patient is to continue weigh himself each morning and take extra Lasix if weight up 3 pounds or more. No change in medications at home.     Signed, Jenell Milliner MD

## 2012-08-20 NOTE — Discharge Summary (Signed)
Discharge Summary   Patient ID: [name],  MRN: [id number], DOB/AGE: September 08, 1945 66 y.o.  Admit date: 08/19/2012 Discharge date: 08/20/2012   Primary Care Physician:  [name]   Primary Cardiologist:  Dr. Loralie [name]  Primary Electrophysiologist:  None   Reason for Admission:  Acute on Chronic Diastolic CHF  Primary Discharge Diagnoses:   1. Acute on Chronic Diastolic CHF  -  Improved at d/c.  Usual dose of Lasix resumed.    Secondary Discharge Diagnoses:   Past Medical History  Diagnosis Date  . Diastolic CHF, chronic     a. EF 55-60% by echo 2007  . Hypertension   . Coronary artery disease     a. s/p MI & CABG; b. s/p PCI Diag;  c. Cath 12/2011: LAD diff dzs/small, Diag 75% isr, 90 dist to stent (small), LCX & RCA occluded, VG->OM 40, VG->PDA patent - med rx.  . History of transient ischemic attack (TIA)     CAROTID DOPPLERS NOV 2011  0-39& BIL. STENOSIS  . Hyperlipidemia   . Allergic rhinitis   . History of diverticulitis of colon     5 YRS AGO  . Diabetes mellitus     ORAL MEDS  . 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 Anxiety   . Depression   . Neuromuscular disorder     left arm numbness   . GERD (gastroesophageal reflux disease)   . Cancer     hx of skin cancers   . Sleep apnea     cpap- see ov note in [facility] 05/12/11 for settings   . Myocardial infarction      Allergies:    Allergies  Allergen Reactions  . Codeine Other (See Comments)    Extremity tingling-- can take synthetic      Procedures Performed This Admission:  None   Hospital Course: [name] is a 66 y.o. male with a hx of CAD (s/p 3v CABG '95, PCI to prox D1 '96, last cath 12/2011 patent grafts w/ 90% distal D1 stenosis not amenable to intervention), Chronic Diastolic CHF (EF 0000000), HTN, HL, TIA, Carotid Artery Dz, DM2, OSA, and obesity (s/p lab banding '10) who presented to [facility] on 08/19/2012 with complaints  of chest pain.  On day of admission he awoke at 4am with tightness and pressure in his chest. No shortness of breath, nausea, or diaphoresis. Felt pain in shoulders and back as well.  Unlike his "heart pain" that he has had in the past. Patient did miss Lasix dose x 1 and was up 8 lbs.  CXR demonstrated mild vascular congestion, borderline cardiomegaly, and mild bilateral atelectasis. Labs were significant for pBNP 124.  EKG without acute changes.  It was suspected that his symptoms were related to acute on chronic diastolic CHF. He was gently diuresed with one dose of IV Lasix. His usual dose of oral Lasix was resumed. On the morning of 12/28 he was feeling much better.  Cardiac markers remained negative.  He was evaluated by Dr. Jenell Milliner and felt to be in stable condition and ready for discharge to home. Potassium was low at 3.4 and this was supplemented prior to discharge. Patient has been instructed to weigh himself daily and to adjust his Lasix if he has a weight gain of 3 lbs or greater. Will plan on followup in the office in the next 2 weeks.   Discharge Vitals: Blood pressure  137/80, pulse 71, temperature 97.8 F (36.6 C), temperature source Oral, resp. rate 18, height 5\' 10"  (1.778 m), weight 218 lb 3.2 oz (98.975 kg), SpO2 98.00%.   Labs: Lab Results  Component Value Date   WBC 5.9 08/19/2012   HGB 12.8* 08/19/2012   HCT 39.2 08/19/2012   MCV 89.1 08/19/2012   PLT 172 08/19/2012     Lab 08/20/12 0622  NA 138  K 3.4*  CL 100  CO2 29  BUN 10  CREATININE 1.07  CALCIUM 9.3  PROT --  BILITOT --  ALKPHOS --  ALT --  AST --  GLUCOSE 135*    Basename 08/20/12 0325 08/19/12 2038 08/19/12 1534  CKTOTAL 40 41 41  CKMB 2.2 2.4 2.7  TROPONINI <0.30 <0.30 <0.30   Lab Results  Component Value Date   CHOL 153 08/20/2012   HDL 37* 08/20/2012   LDLCALC 59 08/20/2012   TRIG 284* 08/20/2012    Diagnostic Procedures and Studies:  Dg Chest Portable 1 View  08/19/2012      IMPRESSION: Mild vascular congestion and borderline cardiomegaly.  Mild bilateral atelectasis seen; lungs otherwise clear.   Original Report Authenticated By: Santa Lighter, M.D.      Disposition:   Pt is being discharged home today in good condition.  Follow-up Plans & Appointments      Follow-up Information    Follow up with [name]. In 2 weeks. (office will call with appt with Dr. Aundra Dubin or Richardson Dopp, PA-C)    Contact information:   [id number] [place] [address] [phone]       Call Jason Cruel, [name]. (as needed)    Contact information:   [address] [place] [address] [phone]         Discharge Medications    Medication List     As of 08/20/2012 12:46 PM    STOP taking these medications         ipratropium 0.03 % nasal spray   Commonly known as: ATROVENT      TAKE these medications         aspirin EC 81 MG tablet   Take 81 mg by mouth at bedtime.      atorvastatin 20 MG tablet   Commonly known as: LIPITOR   Take 10 mg by mouth daily.      carvedilol 25 MG tablet   Commonly known as: COREG   Take 12.5 mg by mouth 2 (two) times daily with a meal. Take 1/2 tablet twice a day      furosemide 40 MG tablet   Commonly known as: LASIX   Take 40 mg by mouth daily.      HYDROcodone-acetaminophen 10-325 MG per tablet   Commonly known as: NORCO   Take 1 tablet by mouth every 6 (six) hours as needed. For pain      KLOR-CON M20 20 MEQ tablet   Generic drug: potassium chloride SA   Take 1 tablet by mouth daily.      lansoprazole 30 MG capsule   Commonly known as: PREVACID   Take 15 mg by mouth daily as needed. As needed in the evening for indigestion      metFORMIN 500 MG tablet   Commonly known as: GLUCOPHAGE   Take 500 mg by mouth 2 (two) times daily with a meal.      nitroGLYCERIN 0.4 MG SL tablet   Commonly known as: NITROSTAT  Place 1 tablet (0.4 mg total) under the  tongue every 5 (five) minutes as needed for chest pain.      sildenafil 100 MG tablet   Commonly known as: VIAGRA   Take 50 mg by mouth daily as needed.      tadalafil 20 MG tablet   Commonly known as: CIALIS   Take 20 mg by mouth daily as needed.      Testosterone 20.25 MG/ACT (1.62%) Gel   Apply 2 Squirts topically daily. Apply to upper arm daily      Vitamin D (Ergocalciferol) 50000 UNITS Caps   Commonly known as: DRISDOL   Take 50,000 Units by mouth every 14 (fourteen) days. Every other Tuesday           Outstanding Labs/Studies 1.  Consider BMET at Follow up visit  Duration of Discharge Encounter: Greater than 30 minutes including physician and PA time.  Signed, Richardson Dopp, PA-C  12:46 PM 08/20/2012      [address] [place],   [address] Phone: [phone] Fax: Marijo Conception. Verl Blalock, [name], [facility] Pager:  [phone]  [phone]

## 2012-08-20 NOTE — Progress Notes (Signed)
Pt d/c to home w/wife. D/c instructions and medications reviewed with Pt and wife. All questions answered for Pt and wife. Both state understanding/

## 2012-08-23 NOTE — Progress Notes (Signed)
Utilization Review Completed.   Cinda Hara, RN, BSN Nurse Case Manager  [phone]  

## 2012-09-08 ENCOUNTER — Ambulatory Visit (INDEPENDENT_AMBULATORY_CARE_PROVIDER_SITE_OTHER): Payer: Medicare Other | Admitting: Cardiology

## 2012-09-08 ENCOUNTER — Encounter: Payer: Self-pay | Admitting: Cardiology

## 2012-09-08 VITALS — BP 121/77 | HR 54 | Ht 70.0 in | Wt 226.1 lb

## 2012-09-08 DIAGNOSIS — I25810 Atherosclerosis of coronary artery bypass graft(s) without angina pectoris: Secondary | ICD-10-CM | POA: Diagnosis not present

## 2012-09-08 DIAGNOSIS — R0602 Shortness of breath: Secondary | ICD-10-CM

## 2012-09-08 DIAGNOSIS — I2510 Atherosclerotic heart disease of native coronary artery without angina pectoris: Secondary | ICD-10-CM

## 2012-09-08 DIAGNOSIS — I509 Heart failure, unspecified: Secondary | ICD-10-CM

## 2012-09-08 DIAGNOSIS — I5032 Chronic diastolic (congestive) heart failure: Secondary | ICD-10-CM | POA: Diagnosis not present

## 2012-09-08 DIAGNOSIS — E785 Hyperlipidemia, unspecified: Secondary | ICD-10-CM

## 2012-09-08 LAB — BASIC METABOLIC PANEL
BUN: 14 mg/dL (ref 6–23)
Creatinine, Ser: 1.1 mg/dL (ref 0.4–1.5)
GFR: 68.04 mL/min (ref >60.00)
Glucose, Bld: 156 mg/dL — ABNORMAL HIGH (ref 70–99)
Potassium: 3.9 mEq/L (ref 3.5–5.1)

## 2012-09-08 NOTE — Patient Instructions (Addendum)
Your physician has requested that you have an echocardiogram. Echocardiography is a painless test that uses sound waves to create images of your heart. It provides your doctor with information about the size and shape of your heart and how well your heart's chambers and valves are working. This procedure takes approximately one hour. There are no restrictions for this procedure.  LABS TODAY:  BNP, BMET  Your physician wants you to follow-up in: 6 months with Dr. Aundra Dubin. You will receive a reminder letter in the mail two months in advance. If you don't receive a letter, please call our office to schedule the follow-up appointment.

## 2012-09-08 NOTE — Progress Notes (Signed)
Patient ID: [name], male   DOB: 1946/07/03, 67 y.o.   MRN: [id number] PCP: Dr Harrington Challenger  67 yo with history of CAD s/p CABG and obesity s/p lap banding procedure presents for cardiology followup.  During the Spring, he developed increased exertional symptoms.  I did a left heart cath on him in 5/13.  This showed patent grafts with a 90% distal D1 stenosis that was not amenable to intervention.  He was admitted just after Christmas 12/13 with chest tightness and dyspnea.  He had had a lot of dietary salt and forgotten his Lasix for a couple of days.  He was diuresed, cardiac enzymes were negative.   Since discharge, he has felt well.  No further exertional dyspnea.  He has some generalized fatigue.  He is going to the gym three days a week.  He is mildly short of breath walking up a flight of steps.  He still goes shag dancing on the weekends with his wife.    Labs (11/12): LDL 70 Labs (5/13): K 4.1, creatinine 1.1, BNP 48 Labs (12/13): K 3.4, creatinine 1.07, LDL 59, HDL 37  PMH: 1. OSA: on CPAP.  2. Diabetes mellitus 3. Allergic rhinitis 4. CAD: s/p CABG 1995.  LHC (10/11) with 90% distal D1 (patent proximal D1 stent), total occlusion CFX, total occlusion RCA, no significant stenoses in LAD, SVG-PDA patent, SVG-OM patent, EF 55%.  Medical management.  LHC (5/13) with patent grafts and 90% distal D1 stenosis (no significant change from 10/11).  5. Obesity: lap-banding in 5/11.  6. Diastolic CHF: Echo (99991111) with EF 55-60%.   7. TIA: Carotid dopplers in 11/11 with 0-39% bilateral stenosis.  8. OA: Knees, C-spine. TKR 9/13.  9. HTN 10. Hyperlipidemia 11. 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  Lives in [place].   FH: Noncontributory.   ROS: All systems reviewed and negative except as per HPI.    Current Outpatient Prescriptions  Medication Sig Dispense Refill  . aspirin EC 81 MG tablet Take 81 mg by mouth at bedtime.      Marland Kitchen atorvastatin  (LIPITOR) 20 MG tablet Take 10 mg by mouth daily.      . carvedilol (COREG) 25 MG tablet Take 12.5 mg by mouth 2 (two) times daily with a meal. Take 1/2 tablet twice a day      . furosemide (LASIX) 40 MG tablet Take 40 mg by mouth daily.      Marland Kitchen HYDROcodone-acetaminophen (NORCO) 10-325 MG per tablet Take 1 tablet by mouth every 6 (six) hours as needed. For pain      . KLOR-CON M20 20 MEQ tablet Take 1 tablet by mouth daily.      . lansoprazole (PREVACID) 30 MG capsule Take 15 mg by mouth daily as needed. As needed in the evening for indigestion      . metFORMIN (GLUCOPHAGE) 500 MG tablet Take 500 mg by mouth 2 (two) times daily with a meal.      . nitroGLYCERIN (NITROSTAT) 0.4 MG SL tablet Place 1 tablet (0.4 mg total) under the tongue every 5 (five) minutes as needed for chest pain.  25 tablet  3  . sildenafil (VIAGRA) 100 MG tablet Take 50 mg by mouth daily as needed.      . tadalafil (CIALIS) 20 MG tablet Take 20 mg by mouth daily as needed.      . Testosterone 20.25 MG/ACT (1.62%) GEL Apply 2 Squirts topically daily. Apply to upper arm  daily      . Vitamin D, Ergocalciferol, (DRISDOL) 50000 UNITS CAPS Take 50,000 Units by mouth every 14 (fourteen) days. Every other Tuesday        BP 121/77  Pulse 54  Ht 5\' 10"  (1.778 m)  Wt 226 lb 1.9 oz (102.567 kg)  BMI 32.44 kg/m2 General: NAD Neck: No JVD, no thyromegaly or thyroid nodule.  Lungs: Clear to auscultation bilaterally with normal respiratory effort. CV: Nondisplaced PMI.  Heart regular S1/S2, no S3/S4, no murmur.  No peripheral edema.  No carotid bruit.  Normal pedal pulses.  Abdomen: Soft, nontender, no hepatosplenomegaly, no distention.  Neurologic: Alert and oriented x 3.  Psych: Normal affect. Extremities: No clubbing or cyanosis.   Assessment/Plan: 1. Diastolic CHF: Suspect that recent exacerbation was related to dietary indiscretion around Christmas + forgetting his Lasix.  He does not appear volume overloaded on exam today.  -  Continue current Lasix dose.  - Check BMET/BNP today. - Will get echo to make sure LV systolic function remains within normal range.  2. CAD: Negative cardiac enzymes during admission in 12/13.  Catheterization < 1 year ago with no change from prior study/grafts patent.  Continue ASA 81, statin, Coreg.  Will add ACEI in the future.  3. Hyperlipidemia: Good lipids in 12/13.  Continue atorvastatin.   [name] 09/08/2012

## 2012-09-13 ENCOUNTER — Other Ambulatory Visit (HOSPITAL_COMMUNITY): Payer: Medicare Other

## 2012-09-20 ENCOUNTER — Other Ambulatory Visit (HOSPITAL_COMMUNITY): Payer: Medicare Other

## 2012-09-21 ENCOUNTER — Other Ambulatory Visit: Payer: Self-pay

## 2012-09-21 ENCOUNTER — Ambulatory Visit (HOSPITAL_COMMUNITY): Payer: Medicare Other | Attending: Cardiology

## 2012-09-21 DIAGNOSIS — I059 Rheumatic mitral valve disease, unspecified: Secondary | ICD-10-CM | POA: Diagnosis not present

## 2012-09-21 DIAGNOSIS — I2510 Atherosclerotic heart disease of native coronary artery without angina pectoris: Secondary | ICD-10-CM

## 2012-09-21 DIAGNOSIS — R0989 Other specified symptoms and signs involving the circulatory and respiratory systems: Secondary | ICD-10-CM | POA: Insufficient documentation

## 2012-09-21 DIAGNOSIS — I25810 Atherosclerosis of coronary artery bypass graft(s) without angina pectoris: Secondary | ICD-10-CM

## 2012-09-21 DIAGNOSIS — R0609 Other forms of dyspnea: Secondary | ICD-10-CM | POA: Diagnosis not present

## 2012-09-21 DIAGNOSIS — L98499 Non-pressure chronic ulcer of skin of other sites with unspecified severity: Secondary | ICD-10-CM | POA: Insufficient documentation

## 2012-09-21 DIAGNOSIS — I379 Nonrheumatic pulmonary valve disorder, unspecified: Secondary | ICD-10-CM | POA: Diagnosis not present

## 2012-09-21 DIAGNOSIS — R0602 Shortness of breath: Secondary | ICD-10-CM | POA: Diagnosis not present

## 2012-10-20 DIAGNOSIS — Z96659 Presence of unspecified artificial knee joint: Secondary | ICD-10-CM | POA: Diagnosis not present

## 2012-10-25 DIAGNOSIS — G479 Sleep disorder, unspecified: Secondary | ICD-10-CM | POA: Diagnosis not present

## 2012-10-25 DIAGNOSIS — E1149 Type 2 diabetes mellitus with other diabetic neurological complication: Secondary | ICD-10-CM | POA: Diagnosis not present

## 2012-10-25 DIAGNOSIS — E119 Type 2 diabetes mellitus without complications: Secondary | ICD-10-CM | POA: Diagnosis not present

## 2012-10-25 DIAGNOSIS — N529 Male erectile dysfunction, unspecified: Secondary | ICD-10-CM | POA: Diagnosis not present

## 2012-10-25 DIAGNOSIS — M659 Synovitis and tenosynovitis, unspecified: Secondary | ICD-10-CM | POA: Diagnosis not present

## 2012-10-25 DIAGNOSIS — L609 Nail disorder, unspecified: Secondary | ICD-10-CM | POA: Diagnosis not present

## 2012-11-03 ENCOUNTER — Encounter: Payer: Self-pay | Admitting: Internal Medicine

## 2012-11-10 DIAGNOSIS — L259 Unspecified contact dermatitis, unspecified cause: Secondary | ICD-10-CM | POA: Diagnosis not present

## 2012-11-10 DIAGNOSIS — Z85828 Personal history of other malignant neoplasm of skin: Secondary | ICD-10-CM | POA: Diagnosis not present

## 2012-11-15 DIAGNOSIS — M545 Low back pain, unspecified: Secondary | ICD-10-CM | POA: Diagnosis not present

## 2012-11-21 ENCOUNTER — Ambulatory Visit: Payer: Medicare Other | Attending: Family Medicine

## 2012-11-21 DIAGNOSIS — M25659 Stiffness of unspecified hip, not elsewhere classified: Secondary | ICD-10-CM | POA: Insufficient documentation

## 2012-11-21 DIAGNOSIS — M545 Low back pain, unspecified: Secondary | ICD-10-CM | POA: Insufficient documentation

## 2012-11-21 DIAGNOSIS — R5381 Other malaise: Secondary | ICD-10-CM | POA: Diagnosis not present

## 2012-11-21 DIAGNOSIS — IMO0001 Reserved for inherently not codable concepts without codable children: Secondary | ICD-10-CM | POA: Diagnosis not present

## 2012-11-22 ENCOUNTER — Ambulatory Visit: Payer: Medicare Other | Attending: Family Medicine

## 2012-11-22 DIAGNOSIS — M545 Low back pain, unspecified: Secondary | ICD-10-CM | POA: Diagnosis not present

## 2012-11-22 DIAGNOSIS — M25659 Stiffness of unspecified hip, not elsewhere classified: Secondary | ICD-10-CM | POA: Insufficient documentation

## 2012-11-22 DIAGNOSIS — IMO0001 Reserved for inherently not codable concepts without codable children: Secondary | ICD-10-CM | POA: Diagnosis not present

## 2012-11-22 DIAGNOSIS — R5381 Other malaise: Secondary | ICD-10-CM | POA: Diagnosis not present

## 2012-11-28 ENCOUNTER — Encounter: Payer: Medicare Other | Admitting: Physical Therapy

## 2012-11-30 ENCOUNTER — Ambulatory Visit: Payer: Medicare Other

## 2012-12-05 ENCOUNTER — Ambulatory Visit: Payer: Medicare Other | Admitting: Physical Therapy

## 2012-12-07 ENCOUNTER — Ambulatory Visit: Payer: Medicare Other | Admitting: Physical Therapy

## 2012-12-12 ENCOUNTER — Ambulatory Visit: Payer: Medicare Other

## 2012-12-14 ENCOUNTER — Encounter: Payer: Medicare Other | Admitting: Physical Therapy

## 2013-01-26 DIAGNOSIS — R5383 Other fatigue: Secondary | ICD-10-CM | POA: Diagnosis not present

## 2013-01-26 DIAGNOSIS — IMO0001 Reserved for inherently not codable concepts without codable children: Secondary | ICD-10-CM | POA: Diagnosis not present

## 2013-03-09 DIAGNOSIS — M5412 Radiculopathy, cervical region: Secondary | ICD-10-CM | POA: Diagnosis not present

## 2013-03-09 DIAGNOSIS — M546 Pain in thoracic spine: Secondary | ICD-10-CM | POA: Diagnosis not present

## 2013-03-09 DIAGNOSIS — E669 Obesity, unspecified: Secondary | ICD-10-CM | POA: Diagnosis not present

## 2013-03-16 DIAGNOSIS — E559 Vitamin D deficiency, unspecified: Secondary | ICD-10-CM | POA: Diagnosis not present

## 2013-03-16 DIAGNOSIS — IMO0001 Reserved for inherently not codable concepts without codable children: Secondary | ICD-10-CM | POA: Diagnosis not present

## 2013-03-16 DIAGNOSIS — E7800 Pure hypercholesterolemia, unspecified: Secondary | ICD-10-CM | POA: Diagnosis not present

## 2013-03-22 ENCOUNTER — Other Ambulatory Visit: Payer: Self-pay | Admitting: Neurosurgery

## 2013-03-22 DIAGNOSIS — M5412 Radiculopathy, cervical region: Secondary | ICD-10-CM

## 2013-03-22 DIAGNOSIS — M2550 Pain in unspecified joint: Secondary | ICD-10-CM | POA: Diagnosis not present

## 2013-03-22 DIAGNOSIS — IMO0001 Reserved for inherently not codable concepts without codable children: Secondary | ICD-10-CM | POA: Diagnosis not present

## 2013-03-22 DIAGNOSIS — M546 Pain in thoracic spine: Secondary | ICD-10-CM

## 2013-03-22 DIAGNOSIS — J329 Chronic sinusitis, unspecified: Secondary | ICD-10-CM | POA: Diagnosis not present

## 2013-03-27 ENCOUNTER — Ambulatory Visit
Admission: RE | Admit: 2013-03-27 | Discharge: 2013-03-27 | Disposition: A | Discharge status: Home / Self Care | Payer: Medicare Other | Source: Ambulatory Visit | Attending: Neurosurgery | Admitting: Neurosurgery

## 2013-03-27 DIAGNOSIS — M5030 Other cervical disc degeneration, unspecified cervical region: Secondary | ICD-10-CM | POA: Diagnosis not present

## 2013-03-27 DIAGNOSIS — M5412 Radiculopathy, cervical region: Secondary | ICD-10-CM

## 2013-03-27 DIAGNOSIS — M546 Pain in thoracic spine: Secondary | ICD-10-CM

## 2013-03-27 DIAGNOSIS — M4802 Spinal stenosis, cervical region: Secondary | ICD-10-CM | POA: Diagnosis not present

## 2013-03-31 DIAGNOSIS — M546 Pain in thoracic spine: Secondary | ICD-10-CM | POA: Diagnosis not present

## 2013-04-11 DIAGNOSIS — M79609 Pain in unspecified limb: Secondary | ICD-10-CM | POA: Diagnosis not present

## 2013-04-11 DIAGNOSIS — R5381 Other malaise: Secondary | ICD-10-CM | POA: Diagnosis not present

## 2013-04-11 DIAGNOSIS — M542 Cervicalgia: Secondary | ICD-10-CM | POA: Diagnosis not present

## 2013-04-11 DIAGNOSIS — M1990 Unspecified osteoarthritis, unspecified site: Secondary | ICD-10-CM | POA: Diagnosis not present

## 2013-04-11 DIAGNOSIS — M2550 Pain in unspecified joint: Secondary | ICD-10-CM | POA: Diagnosis not present

## 2013-04-11 DIAGNOSIS — M545 Low back pain: Secondary | ICD-10-CM | POA: Diagnosis not present

## 2013-05-04 DIAGNOSIS — M2550 Pain in unspecified joint: Secondary | ICD-10-CM | POA: Diagnosis not present

## 2013-05-04 DIAGNOSIS — M1990 Unspecified osteoarthritis, unspecified site: Secondary | ICD-10-CM | POA: Diagnosis not present

## 2013-05-04 DIAGNOSIS — M545 Low back pain: Secondary | ICD-10-CM | POA: Diagnosis not present

## 2013-07-19 DIAGNOSIS — H04129 Dry eye syndrome of unspecified lacrimal gland: Secondary | ICD-10-CM | POA: Diagnosis not present

## 2013-07-19 DIAGNOSIS — H0230 Blepharochalasis unspecified eye, unspecified eyelid: Secondary | ICD-10-CM | POA: Diagnosis not present

## 2013-07-19 DIAGNOSIS — E119 Type 2 diabetes mellitus without complications: Secondary | ICD-10-CM | POA: Diagnosis not present

## 2013-07-19 DIAGNOSIS — H2510 Age-related nuclear cataract, unspecified eye: Secondary | ICD-10-CM | POA: Diagnosis not present

## 2013-08-08 DIAGNOSIS — M1710 Unilateral primary osteoarthritis, unspecified knee: Secondary | ICD-10-CM | POA: Diagnosis not present

## 2013-08-08 DIAGNOSIS — Z471 Aftercare following joint replacement surgery: Secondary | ICD-10-CM | POA: Diagnosis not present

## 2013-08-08 DIAGNOSIS — Z96659 Presence of unspecified artificial knee joint: Secondary | ICD-10-CM | POA: Diagnosis not present

## 2013-08-10 DIAGNOSIS — M1710 Unilateral primary osteoarthritis, unspecified knee: Secondary | ICD-10-CM | POA: Diagnosis not present

## 2013-09-04 ENCOUNTER — Encounter: Payer: Self-pay | Admitting: Physician Assistant

## 2013-09-04 ENCOUNTER — Encounter: Payer: Self-pay | Admitting: *Deleted

## 2013-09-04 ENCOUNTER — Ambulatory Visit (INDEPENDENT_AMBULATORY_CARE_PROVIDER_SITE_OTHER): Payer: Medicare Other | Admitting: Physician Assistant

## 2013-09-04 VITALS — BP 132/74 | HR 52 | Ht 70.0 in | Wt 232.0 lb

## 2013-09-04 DIAGNOSIS — I509 Heart failure, unspecified: Secondary | ICD-10-CM

## 2013-09-04 DIAGNOSIS — I5032 Chronic diastolic (congestive) heart failure: Secondary | ICD-10-CM

## 2013-09-04 DIAGNOSIS — E785 Hyperlipidemia, unspecified: Secondary | ICD-10-CM

## 2013-09-04 DIAGNOSIS — I208 Other forms of angina pectoris: Secondary | ICD-10-CM

## 2013-09-04 DIAGNOSIS — R079 Chest pain, unspecified: Secondary | ICD-10-CM

## 2013-09-04 DIAGNOSIS — I209 Angina pectoris, unspecified: Secondary | ICD-10-CM | POA: Diagnosis not present

## 2013-09-04 DIAGNOSIS — R0602 Shortness of breath: Secondary | ICD-10-CM | POA: Diagnosis not present

## 2013-09-04 DIAGNOSIS — I2510 Atherosclerotic heart disease of native coronary artery without angina pectoris: Secondary | ICD-10-CM | POA: Diagnosis not present

## 2013-09-04 DIAGNOSIS — I10 Essential (primary) hypertension: Secondary | ICD-10-CM

## 2013-09-04 LAB — BASIC METABOLIC PANEL
BUN: 16 mg/dL (ref 6–23)
CO2: 29 meq/L (ref 19–32)
Calcium: 9.3 mg/dL (ref 8.4–10.5)
Chloride: 107 mEq/L (ref 96–112)
Creatinine, Ser: 1.2 mg/dL (ref 0.4–1.5)
GFR: 63.94 mL/min (ref >60.00)
Glucose, Bld: 126 mg/dL — ABNORMAL HIGH (ref 70–99)
POTASSIUM: 4.3 meq/L (ref 3.5–5.1)
SODIUM: 142 meq/L (ref 135–145)

## 2013-09-04 LAB — PROTIME-INR
INR: 1.1 ratio — ABNORMAL HIGH (ref 0.8–1.0)
Prothrombin Time: 11.5 s (ref 10.2–12.4)

## 2013-09-04 LAB — CBC WITH DIFFERENTIAL/PLATELET
BASOS PCT: 0.7 % (ref 0.0–3.0)
Basophils Absolute: 0 10*3/uL (ref 0.0–0.1)
EOS ABS: 0.5 10*3/uL (ref 0.0–0.7)
EOS PCT: 9 % — AB (ref 0.0–5.0)
HEMATOCRIT: 40.3 % (ref 39.0–52.0)
HEMOGLOBIN: 13.7 g/dL (ref 13.0–17.0)
LYMPHS ABS: 1 10*3/uL (ref 0.7–4.0)
Lymphocytes Relative: 17.9 % (ref 12.0–46.0)
MCHC: 34 g/dL (ref 30.0–36.0)
MCV: 92.2 fl (ref 78.0–100.0)
MONO ABS: 0.3 10*3/uL (ref 0.1–1.0)
Monocytes Relative: 5.3 % (ref 3.0–12.0)
NEUTROS ABS: 3.9 10*3/uL (ref 1.4–7.7)
Neutrophils Relative %: 67.1 % (ref 43.0–77.0)
Platelets: 175 10*3/uL (ref 150.0–400.0)
RBC: 4.38 Mil/uL (ref 4.22–5.81)
RDW: 13 % (ref 11.5–14.6)
WBC: 5.8 10*3/uL (ref 4.5–10.5)

## 2013-09-04 MED ORDER — AMLODIPINE BESYLATE 2.5 MG PO TABS: 2.5000 mg | ORAL_TABLET | Freq: Every day | ORAL | Status: DC

## 2013-09-04 MED ORDER — NITROGLYCERIN 0.4 MG SL SUBL: 0.4000 mg | SUBLINGUAL_TABLET | SUBLINGUAL | Status: DC | PRN

## 2013-09-04 NOTE — H&P (Signed)
History and Physical   Date:  09/04/2013   ID:  [name], DOB 30-Apr-1946, MRN [id number]  PCP:   Melinda Crutch, MD  Cardiologist:  Dr. [name]     History of Present Illness: [name] is a 68 y.o. male with a hx of CAD s/p CABG and obesity s/p lap banding procedure.  Last LHC in 12/2011 showed patent grafts with a 90% distal D1 stenosis that was not amenable to intervention. He was admitted just after Christmas 07/2012 with chest tightness and dyspnea. He had had a lot of dietary salt and had forgotten his Lasix for a couple of days. He was diuresed, cardiac enzymes were negative. Last seen by Dr. [name] 08/2012.   Echo (09/21/2012): Mild LVH, EF 60-65%, normal wall motion, normal diastolic function, mild MR mild LAE, mild RVE, RAE, atrial septal aneurysm.  He returns for annual follow up.  Patient has recently noted several episodes of chest discomfort. He typically does shag dancing. Apparently, this is fast-paced. Several weeks ago, he had to stop because of left-sided chest tightness. He had radiation to his jaw, posterior neck and back. He had associated shortness of breath. He denies associated nausea or diaphoresis. He denies syncope or near-syncope. Symptoms improved with rest. He had another episode a couple weeks ago with going up several ladders at work. His last episode was late last week. This again occurred while dancing. He took nitroglycerin with relief. His symptoms are similar to his prior angina.   Recent Labs: 09/08/2012: Creatinine 1.1; Potassium 3.9; Pro B Natriuretic peptide (BNP) 56.0   Wt Readings from Last 3 Encounters:  09/04/13 232 lb (105.235 kg)  09/08/12 226 lb 1.9 oz (102.567 kg)  08/20/12 218 lb 3.2 oz (98.975 kg)     Past Medical History: 1. OSA: on CPAP.  2. Diabetes mellitus  3. Allergic rhinitis  4. CAD: s/p CABG 1995. LHC (10/11) with 90% distal D1 (patent proximal D1 stent), total occlusion CFX, total occlusion RCA, no significant  stenoses in LAD, SVG-PDA patent, SVG-OM patent, EF 55%. Medical management. LHC (5/13) with patent grafts and 90% distal D1 stenosis (no significant change from 10/11).  5. Obesity: lap-banding in 5/11.  6. Diastolic CHF: Echo (99991111) with EF 55-60%.  7. TIA: Carotid dopplers in 11/11 with 0-39% bilateral stenosis.  8. OA: Knees, C-spine. TKR 9/13.  9. HTN  10. Hyperlipidemia  11. Diverticulosis  Past Medical History  Diagnosis Date  . Diastolic CHF, chronic     a. EF 55-60% by echo 2007  . Hypertension   . Coronary artery disease     a. s/p MI & CABG; b. s/p PCI Diag;  c. Cath 12/2011: LAD diff dzs/small, Diag 75% isr, 90 dist to stent (small), LCX & RCA occluded, VG->OM 40, VG->PDA patent - med rx.  . History of transient ischemic attack (TIA)     CAROTID DOPPLERS NOV 2011  0-39& BIL. STENOSIS  . Hyperlipidemia   . Allergic rhinitis   . History of diverticulitis of colon     5 YRS AGO  . Diabetes mellitus     ORAL MEDS  . 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 Anxiety   . Depression   . Neuromuscular disorder     left arm numbness   . GERD (gastroesophageal reflux disease)   . Cancer     hx of skin cancers   . Sleep apnea  cpap- see ov note in [facility] 05/12/11 for settings   . Myocardial infarction     Current Outpatient Prescriptions  Medication Sig Dispense Refill  . aspirin EC 81 MG tablet Take 81 mg by mouth at bedtime.      . carvedilol (COREG) 25 MG tablet Take 12.5 mg by mouth 2 (two) times daily with a meal. Take 1/2 tablet twice a day      . cyclobenzaprine (FLEXERIL) 10 MG tablet Take 10 mg by mouth 3 (three) times daily as needed for muscle spasms.      . fluticasone (FLONASE) 50 MCG/ACT nasal spray Place 1 spray into both nostrils daily.      . furosemide (LASIX) 40 MG tablet Take 40 mg by mouth daily.      Marland Kitchen HYDROcodone-acetaminophen (NORCO) 10-325 MG per tablet Take 1 tablet by mouth every 6 (six) hours as needed.  For pain      . KLOR-CON M20 20 MEQ tablet Take 1 tablet by mouth daily.      . metFORMIN (GLUCOPHAGE) 500 MG tablet Take 500 mg by mouth 2 (two) times daily with a meal.      . mirtazapine (REMERON) 15 MG tablet Take 15 mg by mouth at bedtime.      . nitroGLYCERIN (NITROSTAT) 0.4 MG SL tablet Place 1 tablet (0.4 mg total) under the tongue every 5 (five) minutes as needed for chest pain.  25 tablet  3  . rosuvastatin (CRESTOR) 10 MG tablet Take 10 mg by mouth daily.      . sildenafil (VIAGRA) 100 MG tablet Take 50 mg by mouth daily as needed.      . Testosterone 20.25 MG/ACT (1.62%) GEL Apply 2 Squirts topically daily. Apply to upper arm daily      . traMADol (ULTRAM) 50 MG tablet Take 50 mg by mouth every 6 (six) hours as needed.      . Vitamin D, Ergocalciferol, (DRISDOL) 50000 UNITS CAPS Take 50,000 Units by mouth every 14 (fourteen) days. Every other Tuesday       No current facility-administered medications for this visit.    Allergies:   Codeine   Social History:  The patient  reports that he has never smoked. He has never used smokeless tobacco. He reports that he drinks alcohol. He reports that he does not use illicit drugs.   Family History:  The patient's family history is not on file.   ROS:  Please see the history of present illness.   All other systems reviewed and negative.   PHYSICAL EXAM: VS:  BP 132/74  Pulse 52  Ht 5\' 10"  (1.778 m)  Wt 232 lb (105.235 kg)  BMI 33.29 kg/m2 Well nourished, well developed, in no acute distress HEENT: normal Neck: no JVD Cardiac:  normal S1, S2; RRR; 2/6 systolic murmur along LLSB Lungs:  clear to auscultation bilaterally, no wheezing, rhonchi or rales Abd: soft, nontender, no hepatomegaly Ext: trace bilateral LE edema Skin: warm and dry Neuro:  CNs 2-12 intact, no focal abnormalities noted  EKG:  Sinus bradycardia, HR 52, left axis deviation, increased artifact, nonspecific ST-T wave change     ASSESSMENT AND PLAN:  1. Chest  Pain: The patient describes chest discomfort with more extreme exertion. He is overall describing CCS class II symptoms. We had a long discussion about further workup for his symptoms. He is not interested in pursuing stress testing. I discussed his case today with Dr. Aundra Dubin. We have recommended proceeding with cardiac catheterization  to further evaluate his symptoms. Risks and benefits of cardiac catheterization have been discussed with the patient.  These include bleeding, infection, kidney damage, stroke, heart attack, death.  The patient understands these risks and is willing to proceed.  I will start him on Amlodipine 2.5 mg QD as an anti-anginal.  I will avoid nitrates as he takes PDE-5 inhibitors.   2. CAD:  Proceed with LHC as noted.  Continue aspirin, beta blocker, statin. Start amlodipine is noted. 3. Chronic Diastolic CHF: Volume stable. Continue current therapy 4. Diabetes Mellitus: Hold metformin for cardiac catheterization. 5. Hypertension: Controlled. 6. Hyperlipidemia: Continue statin. 7. Disposition: Proceed with cardiac catheterization next week with Dr. Aundra Dubin  Signed, Richardson Dopp, PA-C  09/04/2013 12:12 PM

## 2013-09-04 NOTE — Patient Instructions (Addendum)
START AMLODIPINE 2.5 MG DAILY  Your physician has requested that you have a cardiac catheterization. LEFT HEART CATH WITH DR. Aundra Dubin 09/11/13 @ 9 AM. Cardiac catheterization is used to diagnose and/or treat various heart conditions. Doctors may recommend this procedure for a number of different reasons. The most common reason is to evaluate chest pain. Chest pain can be a symptom of coronary artery disease (CAD), and cardiac catheterization can show whether plaque is narrowing or blocking your heart's arteries. This procedure is also used to evaluate the valves, as well as measure the blood flow and oxygen levels in different parts of your heart. For further information please visit [email or web address]. Please follow instruction sheet, as given.   LAB WORK TODAY; BMET, CBC W/DIFF, PT/INR

## 2013-09-04 NOTE — Progress Notes (Signed)
[address] [place], [place]  [address] Phone: [phone] Fax:  [phone]  Date:  09/04/2013   ID:  [name], DOB [birth year], MRN [id number]  PCP:   Melinda Crutch, MD  Cardiologist:  Dr. [name]     History of Present Illness: [name] is a 68 y.o. male with a hx of CAD s/p CABG and obesity s/p lap banding procedure.  Last LHC in 12/2011 showed patent grafts with a 90% distal D1 stenosis that was not amenable to intervention. He was admitted just after Christmas 07/2012 with chest tightness and dyspnea. He had had a lot of dietary salt and had forgotten his Lasix for a couple of days. He was diuresed, cardiac enzymes were negative. Last seen by Dr. [name] 08/2012.   Echo (09/21/2012): Mild LVH, EF 60-65%, normal wall motion, normal diastolic function, mild MR mild LAE, mild RVE, RAE, atrial septal aneurysm.  He returns for annual follow up.  Patient has recently noted several episodes of chest discomfort. He typically does shag dancing. Apparently, this is fast-paced. Several weeks ago, he had to stop because of left-sided chest tightness. He had radiation to his jaw, posterior neck and back. He had associated shortness of breath. He denies associated nausea or diaphoresis. He denies syncope or near-syncope. Symptoms improved with rest. He had another episode a couple weeks ago with going up several ladders at work. His last episode was late last week. This again occurred while dancing. He took nitroglycerin with relief. His symptoms are similar to his prior angina.   Recent Labs: 09/08/2012: Creatinine 1.1; Potassium 3.9; Pro B Natriuretic peptide (BNP) 56.0   Wt Readings from Last 3 Encounters:  09/04/13 232 lb (105.235 kg)  09/08/12 226 lb 1.9 oz (102.567 kg)  08/20/12 218 lb 3.2 oz (98.975 kg)     Past Medical History: 1. OSA: on CPAP.  2. Diabetes mellitus  3. Allergic rhinitis  4. CAD: s/p CABG 1995. LHC (10/11) with 90% distal D1 (patent  proximal D1 stent), total occlusion CFX, total occlusion RCA, no significant stenoses in LAD, SVG-PDA patent, SVG-OM patent, EF 55%. Medical management. LHC (5/13) with patent grafts and 90% distal D1 stenosis (no significant change from 10/11).  5. Obesity: lap-banding in 5/11.  6. Diastolic CHF: Echo (99991111) with EF 55-60%.  7. TIA: Carotid dopplers in 11/11 with 0-39% bilateral stenosis.  8. OA: Knees, C-spine. TKR 9/13.  9. HTN  10. Hyperlipidemia  11. Diverticulosis  Past Medical History  Diagnosis Date  . Diastolic CHF, chronic     a. EF 55-60% by echo 2007  . Hypertension   . Coronary artery disease     a. s/p MI & CABG; b. s/p PCI Diag;  c. Cath 12/2011: LAD diff dzs/small, Diag 75% isr, 90 dist to stent (small), LCX & RCA occluded, VG->OM 40, VG->PDA patent - med rx.  . History of transient ischemic attack (TIA)     CAROTID DOPPLERS NOV 2011  0-39& BIL. STENOSIS  . Hyperlipidemia   . Allergic rhinitis   . History of diverticulitis of colon     5 YRS AGO  . Diabetes mellitus     ORAL MEDS  . 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 Anxiety   . Depression   . Neuromuscular disorder     left arm numbness   . GERD (gastroesophageal reflux disease)   . Cancer  hx of skin cancers   . Sleep apnea     cpap- see ov note in [facility] 05/12/11 for settings   . Myocardial infarction     Current Outpatient Prescriptions  Medication Sig Dispense Refill  . aspirin EC 81 MG tablet Take 81 mg by mouth at bedtime.      . carvedilol (COREG) 25 MG tablet Take 12.5 mg by mouth 2 (two) times daily with a meal. Take 1/2 tablet twice a day      . cyclobenzaprine (FLEXERIL) 10 MG tablet Take 10 mg by mouth 3 (three) times daily as needed for muscle spasms.      . fluticasone (FLONASE) 50 MCG/ACT nasal spray Place 1 spray into both nostrils daily.      . furosemide (LASIX) 40 MG tablet Take 40 mg by mouth daily.      Marland Kitchen HYDROcodone-acetaminophen  (NORCO) 10-325 MG per tablet Take 1 tablet by mouth every 6 (six) hours as needed. For pain      . KLOR-CON M20 20 MEQ tablet Take 1 tablet by mouth daily.      . metFORMIN (GLUCOPHAGE) 500 MG tablet Take 500 mg by mouth 2 (two) times daily with a meal.      . mirtazapine (REMERON) 15 MG tablet Take 15 mg by mouth at bedtime.      . nitroGLYCERIN (NITROSTAT) 0.4 MG SL tablet Place 1 tablet (0.4 mg total) under the tongue every 5 (five) minutes as needed for chest pain.  25 tablet  3  . rosuvastatin (CRESTOR) 10 MG tablet Take 10 mg by mouth daily.      . sildenafil (VIAGRA) 100 MG tablet Take 50 mg by mouth daily as needed.      . Testosterone 20.25 MG/ACT (1.62%) GEL Apply 2 Squirts topically daily. Apply to upper arm daily      . traMADol (ULTRAM) 50 MG tablet Take 50 mg by mouth every 6 (six) hours as needed.      . Vitamin D, Ergocalciferol, (DRISDOL) 50000 UNITS CAPS Take 50,000 Units by mouth every 14 (fourteen) days. Every other Tuesday       No current facility-administered medications for this visit.    Allergies:   Codeine   Social History:  The patient  reports that he has never smoked. He has never used smokeless tobacco. He reports that he drinks alcohol. He reports that he does not use illicit drugs.   Family History:  The patient's family history is not on file.   ROS:  Please see the history of present illness.   All other systems reviewed and negative.   PHYSICAL EXAM: VS:  BP 132/74  Pulse 52  Ht 5\' 10"  (1.778 m)  Wt 232 lb (105.235 kg)  BMI 33.29 kg/m2 Well nourished, well developed, in no acute distress HEENT: normal Neck: no JVD Cardiac:  normal S1, S2; RRR; 2/6 systolic murmur along LLSB Lungs:  clear to auscultation bilaterally, no wheezing, rhonchi or rales Abd: soft, nontender, no hepatomegaly Ext: trace bilateral LE edema Skin: warm and dry Neuro:  CNs 2-12 intact, no focal abnormalities noted  EKG:  Sinus bradycardia, HR 52, left axis deviation,  increased artifact, nonspecific ST-T wave change     ASSESSMENT AND PLAN:  1. Chest Pain: The patient describes chest discomfort with more extreme exertion. He is overall describing CCS class II symptoms. We had a long discussion about further workup for his symptoms. He is not interested in pursuing stress testing. I discussed  his case today with Dr. Aundra Dubin. We have recommended proceeding with cardiac catheterization to further evaluate his symptoms. Risks and benefits of cardiac catheterization have been discussed with the patient.  These include bleeding, infection, kidney damage, stroke, heart attack, death.  The patient understands these risks and is willing to proceed.  I will start him on Amlodipine 2.5 mg QD as an anti-anginal.  I will avoid nitrates as he takes PDE-5 inhibitors.   2. CAD:  Proceed with LHC as noted.  Continue aspirin, beta blocker, statin. Start amlodipine is noted. 3. Chronic Diastolic CHF: Volume stable. Continue current therapy 4. Diabetes Mellitus: Hold metformin for cardiac catheterization. 5. Hypertension: Controlled. 6. Hyperlipidemia: Continue statin. 7. Disposition: Proceed with cardiac catheterization next week with Dr. Aundra Dubin  Signed, Richardson Dopp, PA-C  09/04/2013 12:12 PM

## 2013-09-05 NOTE — H&P (Signed)
I agree with the above note, plan for [facility] on 1/19 with me.

## 2013-09-08 ENCOUNTER — Ambulatory Visit: Payer: Medicare Other | Admitting: Cardiology

## 2013-09-11 ENCOUNTER — Ambulatory Visit (HOSPITAL_COMMUNITY)
Admission: RE | Admit: 2013-09-11 | Discharge: 2013-09-11 | Disposition: A | Discharge status: Home / Self Care | Payer: Medicare Other | Source: Ambulatory Visit | Attending: Cardiology | Admitting: Cardiology

## 2013-09-11 ENCOUNTER — Encounter (HOSPITAL_COMMUNITY)
Admission: RE | Disposition: A | Discharge status: Home / Self Care | Payer: Self-pay | Source: Ambulatory Visit | Attending: Cardiology

## 2013-09-11 DIAGNOSIS — Z8673 Personal history of transient ischemic attack (TIA), and cerebral infarction without residual deficits: Secondary | ICD-10-CM | POA: Insufficient documentation

## 2013-09-11 DIAGNOSIS — I5032 Chronic diastolic (congestive) heart failure: Secondary | ICD-10-CM | POA: Diagnosis not present

## 2013-09-11 DIAGNOSIS — G4733 Obstructive sleep apnea (adult) (pediatric): Secondary | ICD-10-CM | POA: Insufficient documentation

## 2013-09-11 DIAGNOSIS — I2510 Atherosclerotic heart disease of native coronary artery without angina pectoris: Secondary | ICD-10-CM | POA: Insufficient documentation

## 2013-09-11 DIAGNOSIS — I509 Heart failure, unspecified: Secondary | ICD-10-CM | POA: Insufficient documentation

## 2013-09-11 DIAGNOSIS — Z7982 Long term (current) use of aspirin: Secondary | ICD-10-CM | POA: Diagnosis not present

## 2013-09-11 DIAGNOSIS — E669 Obesity, unspecified: Secondary | ICD-10-CM | POA: Insufficient documentation

## 2013-09-11 DIAGNOSIS — E119 Type 2 diabetes mellitus without complications: Secondary | ICD-10-CM | POA: Diagnosis not present

## 2013-09-11 DIAGNOSIS — I200 Unstable angina: Secondary | ICD-10-CM | POA: Insufficient documentation

## 2013-09-11 DIAGNOSIS — Y831 Surgical operation with implant of artificial internal device as the cause of abnormal reaction of the patient, or of later complication, without mention of misadventure at the time of the procedure: Secondary | ICD-10-CM | POA: Insufficient documentation

## 2013-09-11 DIAGNOSIS — T82897A Other specified complication of cardiac prosthetic devices, implants and grafts, initial encounter: Secondary | ICD-10-CM | POA: Insufficient documentation

## 2013-09-11 DIAGNOSIS — Z85828 Personal history of other malignant neoplasm of skin: Secondary | ICD-10-CM | POA: Diagnosis not present

## 2013-09-11 DIAGNOSIS — I10 Essential (primary) hypertension: Secondary | ICD-10-CM | POA: Diagnosis not present

## 2013-09-11 DIAGNOSIS — R079 Chest pain, unspecified: Secondary | ICD-10-CM

## 2013-09-11 DIAGNOSIS — IMO0002 Reserved for concepts with insufficient information to code with codable children: Secondary | ICD-10-CM | POA: Diagnosis not present

## 2013-09-11 DIAGNOSIS — J309 Allergic rhinitis, unspecified: Secondary | ICD-10-CM | POA: Insufficient documentation

## 2013-09-11 DIAGNOSIS — Z951 Presence of aortocoronary bypass graft: Secondary | ICD-10-CM | POA: Diagnosis not present

## 2013-09-11 DIAGNOSIS — I208 Other forms of angina pectoris: Secondary | ICD-10-CM

## 2013-09-11 DIAGNOSIS — I2582 Chronic total occlusion of coronary artery: Secondary | ICD-10-CM | POA: Diagnosis not present

## 2013-09-11 DIAGNOSIS — E785 Hyperlipidemia, unspecified: Secondary | ICD-10-CM | POA: Diagnosis not present

## 2013-09-11 DIAGNOSIS — M1710 Unilateral primary osteoarthritis, unspecified knee: Secondary | ICD-10-CM | POA: Insufficient documentation

## 2013-09-11 HISTORY — PX: LEFT HEART CATHETERIZATION WITH CORONARY ANGIOGRAM: SHX5451

## 2013-09-11 LAB — GLUCOSE, CAPILLARY
Glucose-Capillary: 150 mg/dL — ABNORMAL HIGH (ref 70–99)
Glucose-Capillary: 132 mg/dL — ABNORMAL HIGH (ref 70–99)

## 2013-09-11 SURGERY — LEFT HEART CATHETERIZATION WITH CORONARY ANGIOGRAM
Anesthesia: LOCAL

## 2013-09-11 MED ORDER — MIDAZOLAM HCL 2 MG/2ML IJ SOLN
INTRAMUSCULAR | Status: AC
  Filled 2013-09-11: qty 2

## 2013-09-11 MED ORDER — HEPARIN (PORCINE) IN NACL 2-0.9 UNIT/ML-% IJ SOLN
INTRAMUSCULAR | Status: AC
  Filled 2013-09-11: qty 1000

## 2013-09-11 MED ORDER — VERAPAMIL HCL 2.5 MG/ML IV SOLN
INTRAVENOUS | Status: AC
  Filled 2013-09-11: qty 2

## 2013-09-11 MED ORDER — NITROGLYCERIN 0.2 MG/ML ON CALL CATH LAB
INTRAVENOUS | Status: AC
  Filled 2013-09-11: qty 1

## 2013-09-11 MED ORDER — LIDOCAINE HCL (PF) 1 % IJ SOLN
INTRAMUSCULAR | Status: AC
  Filled 2013-09-11: qty 30

## 2013-09-11 MED ORDER — SODIUM CHLORIDE 0.9 % IV SOLN: INTRAVENOUS | Status: AC

## 2013-09-11 MED ORDER — SODIUM CHLORIDE 0.9 % IV SOLN: 250.0000 mL | INTRAVENOUS | Status: DC | PRN

## 2013-09-11 MED ORDER — ONDANSETRON HCL 4 MG/2ML IJ SOLN: 4.0000 mg | Freq: Four times a day (QID) | INTRAMUSCULAR | Status: DC | PRN

## 2013-09-11 MED ORDER — ACETAMINOPHEN 325 MG PO TABS: 650.0000 mg | ORAL_TABLET | ORAL | Status: DC | PRN

## 2013-09-11 MED ORDER — ASPIRIN 81 MG PO CHEW: 81.0000 mg | CHEWABLE_TABLET | ORAL | Status: DC

## 2013-09-11 MED ORDER — SODIUM CHLORIDE 0.9 % IJ SOLN: 3.0000 mL | Freq: Two times a day (BID) | INTRAMUSCULAR | Status: DC

## 2013-09-11 MED ORDER — HEPARIN SODIUM (PORCINE) 1000 UNIT/ML IJ SOLN
INTRAMUSCULAR | Status: AC
  Filled 2013-09-11: qty 1

## 2013-09-11 MED ORDER — NITROGLYCERIN 0.4 MG SL SUBL
SUBLINGUAL_TABLET | SUBLINGUAL | Status: AC
  Filled 2013-09-11: qty 25

## 2013-09-11 MED ORDER — FENTANYL CITRATE 0.05 MG/ML IJ SOLN
INTRAMUSCULAR | Status: AC
  Filled 2013-09-11: qty 2

## 2013-09-11 MED ORDER — SODIUM CHLORIDE 0.9 % IV SOLN
INTRAVENOUS | Status: DC
  Administered 2013-09-11: 07:00:00 via INTRAVENOUS

## 2013-09-11 MED ORDER — NITROGLYCERIN 0.4 MG SL SUBL
0.4000 mg | SUBLINGUAL_TABLET | SUBLINGUAL | Status: DC | PRN
  Administered 2013-09-11: 0.4 mg via SUBLINGUAL
  Filled 2013-09-11: qty 25

## 2013-09-11 MED ORDER — SODIUM CHLORIDE 0.9 % IJ SOLN: 3.0000 mL | INTRAMUSCULAR | Status: DC | PRN

## 2013-09-11 NOTE — Interval H&P Note (Signed)
Cath Lab Visit (complete for each Cath Lab visit)  Clinical Evaluation Leading to the Procedure:   ACS: no  Non-ACS:    Anginal Classification: CCS III  Anti-ischemic medical therapy: Minimal Therapy (1 class of medications)  Non-Invasive Test Results: No non-invasive testing performed  Prior CABG: Previous CABG      History and Physical Interval Note:  09/11/2013 9:35 AM  [name]  has presented today for surgery, with the diagnosis of Chest pain  The various methods of treatment have been discussed with the patient and family. After consideration of risks, benefits and other options for treatment, the patient has consented to  Procedure(s): LEFT HEART CATHETERIZATION WITH CORONARY ANGIOGRAM (N/A) as a surgical intervention .  The patient's history has been reviewed, patient examined, no change in status, stable for surgery.  I have reviewed the patient's chart and labs.  Questions were answered to the patient's satisfaction.     Hazael Olveda [employer]

## 2013-09-11 NOTE — CV Procedure (Signed)
    Cardiac Catheterization Procedure Note  Name: MUSSA MCQUAIG MRN: [id number] DOB: [birth year]  Procedure:  Selective Coronary Angiography, SVG angiography Indication: Accelerated anginal symptoms, known CAD s/p CABG.    Procedural Details: Allen's test was positive on the right wrist.  The right wrist was prepped, draped, and anesthetized with 1% lidocaine. Using the modified Seldinger technique, a 5 French sheath was introduced into the right radial artery. 3 mg of verapamil was administered through the sheath, weight-based unfractionated heparin was administered intravenously. JL3.5 used for LCA, RCB for SVG-RCA system, JR4 for SVG-OM. Catheter exchanges were performed over an exchange length guidewire. LV-gram not done with CKD.  There were no immediate procedural complications. A TR band was used for radial hemostasis at the completion of the procedure.  The patient was transferred to the post catheterization recovery area for further monitoring.  Procedural Findings: Hemodynamics: AO 131/76  Coronary angiography:  Coronary dominance: right   Left mainstem: 20% distal left main stenosis.   Left anterior descending (LAD): The LAD gives off a large, branching 1st diagonal that is comparable in size to the continuation of the LAD. The LAD beyond D1 has no significant disease but is small in caliber. D1 has a stent just after the take-off of a proximal branch. There is 75% stenosis in the proximal portion of the stent. Beyond the stent in the distal D1, there is a 90% stenosis. The vessel is small caliber at this point.  This is similar to the prior study.  Left circumflex (LCx): Totally occluded proximally. SVG-OM is patent with 40% mid graft stenosis. OM is patent after touchdown and backfills to the AV LCx and a more proximal OM.   Right coronary artery (RCA): Total occlusion of the proximal RCA (known from prior cath). SVG-PDA is patent with 30% stenosis in the proximal graft. The PDA  is patent after the touchdown and the graft backfills into a PLV (40% proximal PLV stenosis).   Left ventriculography: Not done.   Final Conclusions: No change compared to prior cath in 2013. The grafts are patent. There is significant disease in a large diagonal just beyond a branch but it is not amenable to intervention (90% distal diagonal stenosis, small caliber at that point).  Will add Imdur 30 mg daily to his regimen and will see him in 2 wks.   [name] 09/11/2013, 10:17 AM

## 2013-09-11 NOTE — Discharge Instructions (Signed)

## 2013-09-11 NOTE — Progress Notes (Signed)
Patient denies chest pain.  Will discharge patient home.

## 2013-09-11 NOTE — Progress Notes (Signed)
Spoke with Dr. Aundra Dubin about patient's 4/10 chest pain/discomfort.  Instructed to give patient nitroglycerin and reassess.  After one nitroglycerin, patient states pain is resolved.  Dr. Aundra Dubin stated to observe patient for one hour to monitor to reoccurrence of pain.  If no reoccurrence of pain, patient can be discharged. Will monitor patient.

## 2013-09-27 ENCOUNTER — Ambulatory Visit (INDEPENDENT_AMBULATORY_CARE_PROVIDER_SITE_OTHER): Payer: Medicare Other | Admitting: Cardiology

## 2013-09-27 ENCOUNTER — Encounter: Payer: Self-pay | Admitting: Cardiology

## 2013-09-27 VITALS — BP 124/78 | HR 51 | Ht 70.0 in | Wt 234.0 lb

## 2013-09-27 DIAGNOSIS — I5032 Chronic diastolic (congestive) heart failure: Secondary | ICD-10-CM | POA: Diagnosis not present

## 2013-09-27 DIAGNOSIS — I2510 Atherosclerotic heart disease of native coronary artery without angina pectoris: Secondary | ICD-10-CM

## 2013-09-27 DIAGNOSIS — I209 Angina pectoris, unspecified: Secondary | ICD-10-CM | POA: Diagnosis not present

## 2013-09-27 DIAGNOSIS — R0602 Shortness of breath: Secondary | ICD-10-CM

## 2013-09-27 DIAGNOSIS — E785 Hyperlipidemia, unspecified: Secondary | ICD-10-CM

## 2013-09-27 DIAGNOSIS — I509 Heart failure, unspecified: Secondary | ICD-10-CM

## 2013-09-27 MED ORDER — POTASSIUM CHLORIDE CRYS ER 20 MEQ PO TBCR: EXTENDED_RELEASE_TABLET | ORAL | Status: DC

## 2013-09-27 MED ORDER — ISOSORBIDE MONONITRATE ER 60 MG PO TB24: 60.0000 mg | ORAL_TABLET | Freq: Every day | ORAL | Status: DC

## 2013-09-27 MED ORDER — FUROSEMIDE 40 MG PO TABS: ORAL_TABLET | ORAL | Status: DC

## 2013-09-27 NOTE — Patient Instructions (Signed)
Increase imdur(isosorbide mononitrate) to 60mg  daily. You can take 2 of your 30mg  tablets daily at the same time and use your current supply.  Increase lasix (furosemide) to 40mg  two times a day for 4 days, then decrease to 40mg  (1 tablet) in the AM and 20MG  (1/2 of a 40mg  tablet) in the PM.  Increase KCL(potassium) to 30 mEq daily. This will be 1 and 1/2 of a 20 mEq tablet daily.   You have been referred to [place].   Your physician recommends that you schedule a follow-up appointment in: 2 weeks with Dr Aundra Dubin.   Your physician recommends that you return for a FASTING lipid profile /BMET/BNP in 2 weeks when you see Dr Aundra Dubin.

## 2013-09-27 NOTE — Progress Notes (Signed)
Patient ID: [name], male   DOB: February 23, 1946, 68 y.o.   MRN: [id number] PCP: Dr Harrington Challenger  68 yo with history of CAD s/p CABG and obesity s/p lap banding procedure presents for cardiology followup.  Over the last few months, he had increased exertional dyspnea and chest tightness.  Therefore, I took him for [facility] in 1/15.  This showed no change from prior.  Grafts were patent and there was severe stenosis in distal D1, not amenable to PCI.  I started him on Imdur 30 mg daily.   Patient continues to have exertional dyspnea. He is short of breath walking about 100 yards.  He went dancing with his wife last weekend and had a lot of dyspnea while dancing.  He has chest pain periodically.  Sometimes this occurs with rest and sometimes with exertion . There is no clear pattern.  He does not feel that Imdur has helped much.  ECG: NSR, IVCD (120 msec)  Labs (11/12): LDL 70 Labs (5/13): K 4.1, creatinine 1.1, BNP 48 Labs (12/13): K 3.4, creatinine 1.07, LDL 59, HDL 37 Labs (1/15): K 4.3, creatinine 1.2, HCT 40.3  PMH: 1. OSA: on CPAP.  2. Diabetes mellitus 3. Allergic rhinitis 4. CAD: s/p CABG 1995.  LHC (10/11) with 90% distal D1 (patent proximal D1 stent), total occlusion CFX, total occlusion RCA, no significant stenoses in LAD, SVG-PDA patent, SVG-OM patent, EF 55%.  Medical management.  LHC (5/13) with patent grafts and 90% distal D1 stenosis (no significant change from 10/11).  LHC (1/15) with patent grafts, 75% ISR in D1 stent, 90% stenosis distal D1 (small caliber). D1 not amenable to intervention.  5. Obesity: lap-banding in 5/11.  6. Diastolic CHF: Echo (99991111) with EF 55-60%.   7. TIA: Carotid dopplers in 11/11 with 0-39% bilateral stenosis.  8. OA: Knees, C-spine. TKR 9/13.  9. HTN 10. Hyperlipidemia 11. Diverticulosis 12. Chronic diastolic CHF: Echo ([id number]) with EF 60-65%, mildly dilated RV with normal systolic function.   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  Lives in  [place].   FH: Noncontributory.   ROS: All systems reviewed and negative except as per HPI.    Current Outpatient Prescriptions  Medication Sig Dispense Refill  . aspirin EC 81 MG tablet Take 81 mg by mouth at bedtime.      . carvedilol (COREG) 25 MG tablet Take 12.5 mg by mouth 2 (two) times daily with a meal. Take 1/2 tablet twice a day      . cyclobenzaprine (FLEXERIL) 10 MG tablet Take 10 mg by mouth 3 (three) times daily as needed for muscle spasms.      Marland Kitchen HYDROcodone-acetaminophen (NORCO) 10-325 MG per tablet Take 1 tablet by mouth every 6 (six) hours as needed for moderate pain. For pain      . metFORMIN (GLUCOPHAGE) 500 MG tablet Take 500 mg by mouth 2 (two) times daily with a meal.      . mirtazapine (REMERON) 15 MG tablet Take 15 mg by mouth at bedtime.      . nitroGLYCERIN (NITROSTAT) 0.4 MG SL tablet Place 1 tablet (0.4 mg total) under the tongue every 5 (five) minutes as needed for chest pain.  25 tablet  3  . rosuvastatin (CRESTOR) 10 MG tablet Take 10 mg by mouth daily.      . sildenafil (VIAGRA) 100 MG tablet Take 50 mg by mouth daily as needed.      . Testosterone 20.25 MG/ACT (1.62%) GEL Apply  2 Squirts topically daily. Apply to upper arm daily      . traMADol (ULTRAM) 50 MG tablet Take 50 mg by mouth every 6 (six) hours as needed for moderate pain.       . Vitamin D, Ergocalciferol, (DRISDOL) 50000 UNITS CAPS Take 50,000 Units by mouth every 14 (fourteen) days. Every other Tuesday      . furosemide (LASIX) 40 MG tablet 1 tablet two times a day for 4 days, then 1 tablet(40mg ) AM and 1/2 tablet (20mg ) PM  60 tablet  3  . isosorbide mononitrate (IMDUR) 60 MG 24 hr tablet Take 1 tablet (60 mg total) by mouth daily.  30 tablet  3  . potassium chloride SA (K-DUR,KLOR-CON) 20 MEQ tablet 1 and 1/2 tablets (30 mEq) daily  45 tablet  3   No current facility-administered medications for this visit.    BP 124/78  Pulse 51  Ht 5\' 10"  (1.778 m)  Wt 234 lb (106.142 kg)   BMI 33.58 kg/m2 General: NAD Neck: Thick, JVP 8 cm, no thyromegaly or thyroid nodule.  Lungs: Clear to auscultation bilaterally with normal respiratory effort. CV: Nondisplaced PMI.  Heart regular S1/S2, no S3/S4, no murmur.  No peripheral edema.  No carotid bruit.  Normal pedal pulses.  Abdomen: Soft, nontender, no hepatosplenomegaly, no distention.  Neurologic: Alert and oriented x 3.  Psych: Normal affect. Extremities: No clubbing or cyanosis.   Assessment/Plan: 1. Diastolic CHF: Increased exertional dyspnea for the last few months.  Coronary anatomy has not changed on LHC.  I suspect that he does have some volume overload which is likely playing a role in his symptoms. - Increase Lasix to 40 mg bid x 4 days, then 40 qam/20 qpm.  Increase KCl to 30 mEq daily.  Check BMET/BNP in 2 wks.   - Cut back sodium in diet.  2. CAD: Cardiac cath showed stable coronary anatomy.  He has severe stenosis in a distal D1 that is not amenable to intervention. As this does not appear to have changed when looking at the prior study, it is hard to pin the chest pain on this.  Continue ASA 81, statin, Coreg.  Increase Imdur to 60 mg daily today to see if it helps. Given possible chronic angina, will see if I can arrange for cardiac rehab.  3. Hyperlipidemia: Check lipids.  Continue atorvastatin.   [name] 09/27/2013

## 2013-10-04 DIAGNOSIS — J069 Acute upper respiratory infection, unspecified: Secondary | ICD-10-CM | POA: Diagnosis not present

## 2013-10-05 ENCOUNTER — Encounter (HOSPITAL_COMMUNITY)
Admission: RE | Admit: 2013-10-05 | Discharge: 2013-10-05 | Disposition: A | Discharge status: Home / Self Care | Payer: Medicare Other | Source: Ambulatory Visit | Attending: Cardiology | Admitting: Cardiology

## 2013-10-05 ENCOUNTER — Telehealth (HOSPITAL_COMMUNITY): Payer: Self-pay | Admitting: *Deleted

## 2013-10-05 NOTE — Telephone Encounter (Signed)
Message copied by Rowe Pavy on Thu Oct 05, 2013 12:47 PM ------      Message from: [name]      Created: Thu Oct 05, 2013 12:38 PM      Regarding: RE: Plan of care for pt's chronic angina?       Start slowly and have him rest if he develops chest pain. I may add ranolazine to his regimen but not totally convinced the chest discomfort is always cardiac.             ----- Message -----         From: Rowe Pavy, RN         Sent: 10/05/2013   9:06 AM           To: [name], MD      Subject: Plan of care for pt's chronic angina?                    Pt in this morning for orientation to cardiac rehab ( no exercise -assessment and information about the program).  Pt quite fatigued upon arrival walked to our department from the [place] tower entrance.              During vital sign assessment pt reported that he had a rough night -inquired.  Pt stated that he took 3 ntg for chest pain and he almost called 911.  Asked why he didn't he responded that his wife who is a nurse told him to "chill out".  Pt  complains of chest discomfort  1-2/10 during VS but did not mention this while he completed his paper work (15 minutes had past)  Bp 98/60, O2 sat 94 ra and HR 74 - which pt said was high so I did not administer ntg.  Pain did resolved while he sat down during our conversation.  I did review the cath report and your office note.    Pt is compliant with his medications and did take his imdur 60mg  this morning.               How would you like for [place] to proceed particular for exercise for pt with chronic angina to avoid stopping exercise and trips to the ER? - Parameters specific for this particular pt.            Thanks so much      Psychologist, clinical             ------

## 2013-10-05 NOTE — Progress Notes (Signed)
Cardiac Rehab Medication Review by a Pharmacist  Does the patient  feel that his/her medications are working for him/her?  yes  Has the patient been experiencing any side effects to the medications prescribed?  no  Does the patient measure his/her own blood pressure or blood glucose at home?  yes   Does the patient have any problems obtaining medications due to transportation or finances?   no  Understanding of regimen: good Understanding of indications: good Potential of compliance: good  Pharmacist comments: Patient has a good understanding of his medications. He had to be prompted on some of the indications for his medications. He mentioned he had chest pain yesterday and took nitroglycerin x 3. He said his wife is a Marine scientist and took his vitals and did not believe he was having a heart attack. I counseled the patient on the appropriate use of nitroglycerin and when to report to the ED. The chest pain could be due to the patient having a cough the past few days. He was recently started on amoxicillin 500mg  BID x 10 days and Hycodan. Patient also mentioned that he has had less energy recently.   [name] A. Pincus Badder, PharmD Clinical Pharmacist - Resident Pager: [phone] Pharmacy: [phone] 10/05/2013 9:55 AM

## 2013-10-09 ENCOUNTER — Encounter (HOSPITAL_COMMUNITY): Payer: Medicare Other

## 2013-10-11 ENCOUNTER — Encounter (HOSPITAL_COMMUNITY): Payer: Medicare Other

## 2013-10-13 ENCOUNTER — Encounter (HOSPITAL_COMMUNITY): Payer: Medicare Other

## 2013-10-16 ENCOUNTER — Telehealth (HOSPITAL_COMMUNITY): Payer: Self-pay | Admitting: Family Medicine

## 2013-10-16 ENCOUNTER — Other Ambulatory Visit: Payer: Self-pay | Admitting: *Deleted

## 2013-10-16 ENCOUNTER — Encounter: Payer: Self-pay | Admitting: *Deleted

## 2013-10-16 ENCOUNTER — Encounter (HOSPITAL_COMMUNITY): Payer: Medicare Other

## 2013-10-18 ENCOUNTER — Encounter (HOSPITAL_COMMUNITY): Payer: Medicare Other

## 2013-10-19 ENCOUNTER — Other Ambulatory Visit: Payer: Medicare Other

## 2013-10-19 ENCOUNTER — Ambulatory Visit: Payer: Medicare Other | Admitting: Cardiology

## 2013-10-20 ENCOUNTER — Encounter (HOSPITAL_COMMUNITY): Payer: Medicare Other

## 2013-10-23 ENCOUNTER — Encounter (HOSPITAL_COMMUNITY): Payer: Medicare Other

## 2013-10-25 ENCOUNTER — Encounter (HOSPITAL_COMMUNITY): Payer: Medicare Other

## 2013-10-26 ENCOUNTER — Telehealth (HOSPITAL_COMMUNITY): Payer: Self-pay | Admitting: *Deleted

## 2013-10-26 ENCOUNTER — Other Ambulatory Visit (INDEPENDENT_AMBULATORY_CARE_PROVIDER_SITE_OTHER): Payer: Medicare Other

## 2013-10-26 DIAGNOSIS — I209 Angina pectoris, unspecified: Secondary | ICD-10-CM | POA: Diagnosis not present

## 2013-10-26 DIAGNOSIS — I2510 Atherosclerotic heart disease of native coronary artery without angina pectoris: Secondary | ICD-10-CM

## 2013-10-26 DIAGNOSIS — I5032 Chronic diastolic (congestive) heart failure: Secondary | ICD-10-CM | POA: Diagnosis not present

## 2013-10-26 DIAGNOSIS — R0602 Shortness of breath: Secondary | ICD-10-CM

## 2013-10-26 LAB — BASIC METABOLIC PANEL
BUN: 20 mg/dL (ref 6–23)
CO2: 26 mEq/L (ref 19–32)
Calcium: 9.5 mg/dL (ref 8.4–10.5)
Chloride: 101 mEq/L (ref 96–112)
Creatinine, Ser: 1.2 mg/dL (ref 0.4–1.5)
GFR: 64.53 mL/min (ref >60.00)
GLUCOSE: 124 mg/dL — AB (ref 70–99)
POTASSIUM: 4 meq/L (ref 3.5–5.1)
Sodium: 138 mEq/L (ref 135–145)

## 2013-10-26 LAB — BRAIN NATRIURETIC PEPTIDE: Pro B Natriuretic peptide (BNP): 55 pg/mL (ref 0.0–100.0)

## 2013-10-26 LAB — LIPID PANEL
Cholesterol: 159 mg/dL (ref 0–200)
HDL: 39.8 mg/dL (ref >39.00)
LDL Cholesterol: 81 mg/dL (ref 0–99)
Total CHOL/HDL Ratio: 4
Triglycerides: 192 mg/dL — ABNORMAL HIGH (ref 0.0–149.0)
VLDL: 38.4 mg/dL (ref 0.0–40.0)

## 2013-10-27 ENCOUNTER — Encounter: Payer: Self-pay | Admitting: *Deleted

## 2013-10-27 ENCOUNTER — Other Ambulatory Visit: Payer: Medicare Other

## 2013-10-27 ENCOUNTER — Encounter (HOSPITAL_COMMUNITY): Payer: Medicare Other

## 2013-10-30 ENCOUNTER — Encounter (HOSPITAL_COMMUNITY): Payer: Medicare Other

## 2013-11-01 ENCOUNTER — Encounter (HOSPITAL_COMMUNITY)
Admission: RE | Admit: 2013-11-01 | Discharge: 2013-11-01 | Disposition: A | Discharge status: Home / Self Care | Payer: Medicare Other | Source: Ambulatory Visit | Attending: Cardiology | Admitting: Cardiology

## 2013-11-01 DIAGNOSIS — I10 Essential (primary) hypertension: Secondary | ICD-10-CM | POA: Insufficient documentation

## 2013-11-01 DIAGNOSIS — E669 Obesity, unspecified: Secondary | ICD-10-CM | POA: Insufficient documentation

## 2013-11-01 DIAGNOSIS — Z951 Presence of aortocoronary bypass graft: Secondary | ICD-10-CM | POA: Insufficient documentation

## 2013-11-01 DIAGNOSIS — E119 Type 2 diabetes mellitus without complications: Secondary | ICD-10-CM | POA: Insufficient documentation

## 2013-11-01 DIAGNOSIS — IMO0001 Reserved for inherently not codable concepts without codable children: Secondary | ICD-10-CM | POA: Insufficient documentation

## 2013-11-01 DIAGNOSIS — I2510 Atherosclerotic heart disease of native coronary artery without angina pectoris: Secondary | ICD-10-CM | POA: Diagnosis not present

## 2013-11-01 DIAGNOSIS — I509 Heart failure, unspecified: Secondary | ICD-10-CM | POA: Insufficient documentation

## 2013-11-01 DIAGNOSIS — E785 Hyperlipidemia, unspecified: Secondary | ICD-10-CM | POA: Insufficient documentation

## 2013-11-01 LAB — GLUCOSE, CAPILLARY
GLUCOSE-CAPILLARY: 144 mg/dL — AB (ref 70–99)
Glucose-Capillary: 141 mg/dL — ABNORMAL HIGH (ref 70–99)

## 2013-11-01 NOTE — Progress Notes (Signed)
Pt started cardiac rehab today.  Pt tolerated light exercise without difficulty. Telemetry rhythm Sinus rhythm. Vital signs stable. Will continue to monitor the patient throughout  the program.

## 2013-11-03 ENCOUNTER — Other Ambulatory Visit: Payer: Self-pay | Admitting: *Deleted

## 2013-11-03 ENCOUNTER — Encounter (HOSPITAL_COMMUNITY)
Admission: RE | Admit: 2013-11-03 | Discharge: 2013-11-03 | Disposition: A | Discharge status: Home / Self Care | Payer: Medicare Other | Source: Ambulatory Visit | Attending: Cardiology | Admitting: Cardiology

## 2013-11-03 DIAGNOSIS — E669 Obesity, unspecified: Secondary | ICD-10-CM | POA: Diagnosis not present

## 2013-11-03 DIAGNOSIS — IMO0001 Reserved for inherently not codable concepts without codable children: Secondary | ICD-10-CM | POA: Diagnosis not present

## 2013-11-03 DIAGNOSIS — E119 Type 2 diabetes mellitus without complications: Secondary | ICD-10-CM | POA: Diagnosis not present

## 2013-11-03 DIAGNOSIS — I509 Heart failure, unspecified: Secondary | ICD-10-CM | POA: Diagnosis not present

## 2013-11-03 DIAGNOSIS — I10 Essential (primary) hypertension: Secondary | ICD-10-CM | POA: Diagnosis not present

## 2013-11-03 DIAGNOSIS — E785 Hyperlipidemia, unspecified: Secondary | ICD-10-CM | POA: Diagnosis not present

## 2013-11-03 LAB — GLUCOSE, CAPILLARY
Glucose-Capillary: 136 mg/dL — ABNORMAL HIGH (ref 70–99)
Glucose-Capillary: 139 mg/dL — ABNORMAL HIGH (ref 70–99)

## 2013-11-06 ENCOUNTER — Encounter (HOSPITAL_COMMUNITY)
Admission: RE | Admit: 2013-11-06 | Discharge: 2013-11-06 | Disposition: A | Discharge status: Home / Self Care | Payer: Medicare Other | Source: Ambulatory Visit | Attending: Cardiology | Admitting: Cardiology

## 2013-11-06 DIAGNOSIS — I10 Essential (primary) hypertension: Secondary | ICD-10-CM | POA: Diagnosis not present

## 2013-11-06 DIAGNOSIS — E119 Type 2 diabetes mellitus without complications: Secondary | ICD-10-CM | POA: Diagnosis not present

## 2013-11-06 DIAGNOSIS — IMO0001 Reserved for inherently not codable concepts without codable children: Secondary | ICD-10-CM | POA: Diagnosis not present

## 2013-11-06 DIAGNOSIS — E669 Obesity, unspecified: Secondary | ICD-10-CM | POA: Diagnosis not present

## 2013-11-06 DIAGNOSIS — E785 Hyperlipidemia, unspecified: Secondary | ICD-10-CM | POA: Diagnosis not present

## 2013-11-06 DIAGNOSIS — I509 Heart failure, unspecified: Secondary | ICD-10-CM | POA: Diagnosis not present

## 2013-11-06 LAB — GLUCOSE, CAPILLARY
Glucose-Capillary: 142 mg/dL — ABNORMAL HIGH (ref 70–99)
Glucose-Capillary: 127 mg/dL — ABNORMAL HIGH (ref 70–99)

## 2013-11-08 ENCOUNTER — Encounter (HOSPITAL_COMMUNITY)
Admission: RE | Admit: 2013-11-08 | Discharge: 2013-11-08 | Disposition: A | Discharge status: Home / Self Care | Payer: Medicare Other | Source: Ambulatory Visit | Attending: Cardiology | Admitting: Cardiology

## 2013-11-08 DIAGNOSIS — I509 Heart failure, unspecified: Secondary | ICD-10-CM | POA: Diagnosis not present

## 2013-11-08 DIAGNOSIS — E119 Type 2 diabetes mellitus without complications: Secondary | ICD-10-CM | POA: Diagnosis not present

## 2013-11-08 DIAGNOSIS — IMO0001 Reserved for inherently not codable concepts without codable children: Secondary | ICD-10-CM | POA: Diagnosis not present

## 2013-11-08 DIAGNOSIS — I10 Essential (primary) hypertension: Secondary | ICD-10-CM | POA: Diagnosis not present

## 2013-11-08 DIAGNOSIS — E669 Obesity, unspecified: Secondary | ICD-10-CM | POA: Diagnosis not present

## 2013-11-08 DIAGNOSIS — E785 Hyperlipidemia, unspecified: Secondary | ICD-10-CM | POA: Diagnosis not present

## 2013-11-08 LAB — GLUCOSE, CAPILLARY: Glucose-Capillary: 150 mg/dL — ABNORMAL HIGH (ref 70–99)

## 2013-11-10 ENCOUNTER — Encounter (HOSPITAL_COMMUNITY): Payer: Medicare Other

## 2013-11-10 ENCOUNTER — Telehealth (HOSPITAL_COMMUNITY): Payer: Self-pay | Admitting: Family Medicine

## 2013-11-13 ENCOUNTER — Encounter (HOSPITAL_COMMUNITY)
Admission: RE | Admit: 2013-11-13 | Discharge: 2013-11-13 | Disposition: A | Discharge status: Home / Self Care | Payer: Medicare Other | Source: Ambulatory Visit | Attending: Cardiology | Admitting: Cardiology

## 2013-11-13 ENCOUNTER — Encounter: Payer: Self-pay | Admitting: Cardiology

## 2013-11-13 ENCOUNTER — Ambulatory Visit (INDEPENDENT_AMBULATORY_CARE_PROVIDER_SITE_OTHER): Payer: Medicare Other | Admitting: Cardiology

## 2013-11-13 VITALS — BP 122/58 | HR 60 | Ht 70.0 in | Wt 235.0 lb

## 2013-11-13 DIAGNOSIS — I2510 Atherosclerotic heart disease of native coronary artery without angina pectoris: Secondary | ICD-10-CM | POA: Diagnosis not present

## 2013-11-13 DIAGNOSIS — E785 Hyperlipidemia, unspecified: Secondary | ICD-10-CM | POA: Diagnosis not present

## 2013-11-13 DIAGNOSIS — I509 Heart failure, unspecified: Secondary | ICD-10-CM

## 2013-11-13 DIAGNOSIS — I5032 Chronic diastolic (congestive) heart failure: Secondary | ICD-10-CM

## 2013-11-13 DIAGNOSIS — IMO0001 Reserved for inherently not codable concepts without codable children: Secondary | ICD-10-CM | POA: Diagnosis not present

## 2013-11-13 DIAGNOSIS — I209 Angina pectoris, unspecified: Secondary | ICD-10-CM | POA: Diagnosis not present

## 2013-11-13 DIAGNOSIS — I10 Essential (primary) hypertension: Secondary | ICD-10-CM

## 2013-11-13 DIAGNOSIS — E119 Type 2 diabetes mellitus without complications: Secondary | ICD-10-CM | POA: Diagnosis not present

## 2013-11-13 DIAGNOSIS — E669 Obesity, unspecified: Secondary | ICD-10-CM | POA: Diagnosis not present

## 2013-11-13 LAB — GLUCOSE, CAPILLARY
GLUCOSE-CAPILLARY: 150 mg/dL — AB (ref 70–99)
GLUCOSE-CAPILLARY: 141 mg/dL — AB (ref 70–99)

## 2013-11-13 NOTE — Patient Instructions (Signed)
Your physician wants you to follow-up in: 4 months with Dr Aundra Dubin. ( July 2015).  You will receive a reminder letter in the mail two months in advance. If you don't receive a letter, please call our office to schedule the follow-up appointment.

## 2013-11-13 NOTE — Progress Notes (Signed)
[name] 68 y.o. male Nutrition Note Spoke with pt.  Nutrition plan and goals reviewed with pt. Pt has not returned his Nutrition Survey. Pt plans to turn in his survey this week. Pt wants to lose wt. Pt has not been actively trying to lose wt. Pt reports difficulty with "my craving for sweets." According to pt, his wt is up 40 lb over the past 6 months. Some wt gain due to fluid gain with CHF. This Probation officer emphasized the need to eat balanced meals to help decrease sugar cravings. Wt loss tips reviewed.  Pt is diabetic. No recent A1c noted. Pt reports his MD told him his last A1c was "a little on the low side." Pt states his CBG's 2 hours post-prandial typically run from 110-130 mg/dL and pt checks CBG's at home "when I feel bad." Pt expressed understanding of the information reviewed. Pt aware of nutrition education classes offered.  Nutrition Diagnosis   Food-and nutrition-related knowledge deficit related to lack of exposure to information as related to diagnosis of: ? CVD ? DM    Obesity related to excessive energy intake as evidenced by a BMI of 34.6  Nutrition RX/ Estimated Daily Nutrition Needs for: wt loss  1700-2200 Kcal, 45-60 gm fat, 11-15 gm sat fat, 1.7-2.2 gm trans-fat, <1500 mg sodium, 250-325 gm CHO   Nutrition Intervention   Pt's individual nutrition plan reviewed with pt.   Benefits of adopting Therapeutic Lifestyle Changes discussed when Medficts reviewed.   Pt to attend the Portion Distortion class   Pt to attend the Diabetes Q & A class - met 11/03/13   Pt given handouts for: ? Nutrition I class ? Nutrition II class ? Diabetes Blitz class   Continue client-centered nutrition education by RD, as part of interdisciplinary care. Goal(s)   Pt to identify and limit food sources of saturated fat, trans fat, and cholesterol   Pt to identify food quantities necessary to achieve: ? wt loss to a goal wt of 208-226 lb (94.6-102.8 kg) at graduation from cardiac rehab.    Pt to  describe the benefit of including fruits, vegetables, whole grains, and low-fat dairy products in a heart healthy meal plan. Monitor and Evaluate progress toward nutrition goal with team. Nutrition Risk: Change to Moderate Derek Mound, M.Ed, RD, LDN, CDE 11/13/2013 10:44 AM

## 2013-11-14 NOTE — Progress Notes (Signed)
Patient ID: [name], male   DOB: 1946-08-18, 68 y.o.   MRN: [id number] PCP: Dr Harrington Challenger  68 yo with history of CAD s/p CABG and obesity s/p lap banding procedure presents for cardiology followup.  Over the last few months, he had increased exertional dyspnea and chest tightness.  Therefore, I took him for [facility] in 1/15.  This showed no change from prior.  Grafts were patent and there was severe stenosis in distal D1, not amenable to PCI.  I started him on Imdur 30 mg daily, later increasing it to 60 mg daily. At last appointment, I also increased his Lasix.   Patient has been doing better since last appointment.  He has had only 1 episode of chest pain.  This was while he was dancing and was relatively mild.  He has been dancing several times since I saw him last.  He has done well without significant exertional dyspnea and only that one episode of chest pain that I mentioned above.    Labs (11/12): LDL 70 Labs (5/13): K 4.1, creatinine 1.1, BNP 48 Labs (12/13): K 3.4, creatinine 1.07, LDL 59, HDL 37 Labs (1/15): K 4.3, creatinine 1.2, HCT 40.3 Labs (3/15): LDL 38, HDL 81, BNP 55, K 4, creatinine 1.2  PMH: 1. OSA: on CPAP.  2. Diabetes mellitus 3. Allergic rhinitis 4. CAD: s/p CABG 1995.  LHC (10/11) with 90% distal D1 (patent proximal D1 stent), total occlusion CFX, total occlusion RCA, no significant stenoses in LAD, SVG-PDA patent, SVG-OM patent, EF 55%.  Medical management.  LHC (5/13) with patent grafts and 90% distal D1 stenosis (no significant change from 10/11).  LHC (1/15) with patent grafts, 75% ISR in D1 stent, 90% stenosis distal D1 (small caliber). D1 not amenable to intervention.  5. Obesity: lap-banding in 5/11.  6. Diastolic CHF: Echo (99991111) with EF 55-60%.   7. TIA: Carotid dopplers in 11/11 with 0-39% bilateral stenosis.  8. OA: Knees, C-spine. TKR 9/13.  9. HTN 10. Hyperlipidemia 11. Diverticulosis 12. Chronic diastolic CHF: Echo ([id number]) with EF 60-65%, mildly dilated RV  with normal systolic function.   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   FH: Noncontributory.    Current Outpatient Prescriptions  Medication Sig Dispense Refill  . amLODipine (NORVASC) 2.5 MG tablet Take 1 tablet by mouth daily.      Marland Kitchen amoxicillin (AMOXIL) 500 MG capsule Take 500 mg by mouth 2 (two) times daily.      Marland Kitchen aspirin EC 81 MG tablet Take 81 mg by mouth at bedtime.      . carvedilol (COREG) 25 MG tablet Take 12.5 mg by mouth 2 (two) times daily with a meal.       . fluticasone (FLONASE) 50 MCG/ACT nasal spray Place 1 spray into both nostrils daily as needed for allergies or rhinitis.      . furosemide (LASIX) 40 MG tablet Take 40 mg by mouth 2 (two) times daily. 1 tablet(40mg ) AM and 1/2 tablet (20mg ) PM      . HYDROcodone-acetaminophen (NORCO) 10-325 MG per tablet Take 1 tablet by mouth every 6 (six) hours as needed for moderate pain. For pain      . HYDROcodone-homatropine (HYCODAN) 5-1.5 MG/5ML syrup Take 5 mLs by mouth 2 (two) times daily as needed for cough.      . isosorbide mononitrate (IMDUR) 60 MG 24 hr tablet Take 1 tablet (60 mg total) by mouth daily.  30 tablet  3  . metFORMIN (GLUCOPHAGE) 500 MG tablet Take 500 mg by mouth 2 (two) times daily with a meal.      . mirtazapine (REMERON) 15 MG tablet Take 15 mg by mouth at bedtime.      . nitroGLYCERIN (NITROSTAT) 0.4 MG SL tablet Place 1 tablet (0.4 mg total) under the tongue every 5 (five) minutes as needed for chest pain.  25 tablet  3  . potassium chloride SA (K-DUR,KLOR-CON) 20 MEQ tablet Take by mouth daily. 1 and 1/2 tablets (30 mEq) daily      . rosuvastatin (CRESTOR) 10 MG tablet Take 10 mg by mouth daily.      . Vitamin D, Ergocalciferol, (DRISDOL) 50000 UNITS CAPS Take 50,000 Units by mouth every 14 (fourteen) days. Every other Tuesday       No current facility-administered medications for this visit.    BP 122/58  Pulse 60  Ht 5\' 10"  (1.778 m)  Wt 106.595 kg  (235 lb)  BMI 33.72 kg/m2 General: NAD Neck: Thick, JVP 7 cm, no thyromegaly or thyroid nodule.  Lungs: Clear to auscultation bilaterally with normal respiratory effort. CV: Nondisplaced PMI.  Heart regular S1/S2, no S3/S4, no murmur.  No peripheral edema.  No carotid bruit.  Normal pedal pulses.  Abdomen: Soft, nontender, no hepatosplenomegaly, no distention.  Neurologic: Alert and oriented x 3.  Psych: Normal affect. Extremities: No clubbing or cyanosis.   Assessment/Plan: 1. Diastolic CHF: Patient is doing better on higher dose of Lasix.  I suspect that he is near-euvolemic.  I will continue the current dose of Lasix and KCl.  Recent BMET was good.  2. CAD: Most recent cardiac cath showed stable coronary anatomy.  He has severe stenosis in a distal D1 that is not amenable to intervention. As this does not appear to have changed when looking at the prior study, it is hard to pin the chest pain on this.  Continue ASA 81, statin, Coreg.  Continue Imdur 60 mg daily as the higher dose of nitrate seems to have lessened chest pain.  3. Hyperlipidemia: Good lipids in 3/15.   [name] 11/14/2013

## 2013-11-15 ENCOUNTER — Encounter (HOSPITAL_COMMUNITY)
Admission: RE | Admit: 2013-11-15 | Discharge: 2013-11-15 | Disposition: A | Discharge status: Home / Self Care | Payer: Medicare Other | Source: Ambulatory Visit | Attending: Cardiology | Admitting: Cardiology

## 2013-11-15 DIAGNOSIS — IMO0001 Reserved for inherently not codable concepts without codable children: Secondary | ICD-10-CM | POA: Diagnosis not present

## 2013-11-15 DIAGNOSIS — I10 Essential (primary) hypertension: Secondary | ICD-10-CM | POA: Diagnosis not present

## 2013-11-15 DIAGNOSIS — I509 Heart failure, unspecified: Secondary | ICD-10-CM | POA: Diagnosis not present

## 2013-11-15 DIAGNOSIS — E785 Hyperlipidemia, unspecified: Secondary | ICD-10-CM | POA: Diagnosis not present

## 2013-11-15 DIAGNOSIS — E119 Type 2 diabetes mellitus without complications: Secondary | ICD-10-CM | POA: Diagnosis not present

## 2013-11-15 DIAGNOSIS — E669 Obesity, unspecified: Secondary | ICD-10-CM | POA: Diagnosis not present

## 2013-11-15 LAB — GLUCOSE, CAPILLARY: GLUCOSE-CAPILLARY: 151 mg/dL — AB (ref 70–99)

## 2013-11-16 DIAGNOSIS — L820 Inflamed seborrheic keratosis: Secondary | ICD-10-CM | POA: Diagnosis not present

## 2013-11-16 DIAGNOSIS — Z85828 Personal history of other malignant neoplasm of skin: Secondary | ICD-10-CM | POA: Diagnosis not present

## 2013-11-16 DIAGNOSIS — L570 Actinic keratosis: Secondary | ICD-10-CM | POA: Diagnosis not present

## 2013-11-16 DIAGNOSIS — L819 Disorder of pigmentation, unspecified: Secondary | ICD-10-CM | POA: Diagnosis not present

## 2013-11-17 ENCOUNTER — Encounter (HOSPITAL_COMMUNITY): Payer: Medicare Other

## 2013-11-20 ENCOUNTER — Encounter (HOSPITAL_COMMUNITY)
Admission: RE | Admit: 2013-11-20 | Discharge: 2013-11-20 | Disposition: A | Discharge status: Home / Self Care | Payer: Medicare Other | Source: Ambulatory Visit | Attending: Cardiology | Admitting: Cardiology

## 2013-11-20 DIAGNOSIS — I10 Essential (primary) hypertension: Secondary | ICD-10-CM | POA: Diagnosis not present

## 2013-11-20 DIAGNOSIS — E119 Type 2 diabetes mellitus without complications: Secondary | ICD-10-CM | POA: Diagnosis not present

## 2013-11-20 DIAGNOSIS — I509 Heart failure, unspecified: Secondary | ICD-10-CM | POA: Diagnosis not present

## 2013-11-20 DIAGNOSIS — IMO0001 Reserved for inherently not codable concepts without codable children: Secondary | ICD-10-CM | POA: Diagnosis not present

## 2013-11-20 DIAGNOSIS — E669 Obesity, unspecified: Secondary | ICD-10-CM | POA: Diagnosis not present

## 2013-11-20 DIAGNOSIS — E785 Hyperlipidemia, unspecified: Secondary | ICD-10-CM | POA: Diagnosis not present

## 2013-11-20 LAB — GLUCOSE, CAPILLARY: Glucose-Capillary: 145 mg/dL — ABNORMAL HIGH (ref 70–99)

## 2013-11-20 NOTE — Progress Notes (Signed)
Academic Intern eviewed home exercise with pt today.  Pt plans to walk at home for exercise.  Reviewed THR, pulse, RPE, sign and symptoms, and when to call 911 or MD.  Pt voiced understanding. Alberteen Sam, MA, ACSM RCEP

## 2013-11-22 ENCOUNTER — Encounter (HOSPITAL_COMMUNITY)
Admission: RE | Admit: 2013-11-22 | Discharge: 2013-11-22 | Disposition: A | Discharge status: Home / Self Care | Payer: Medicare Other | Source: Ambulatory Visit | Attending: Cardiology | Admitting: Cardiology

## 2013-11-22 ENCOUNTER — Telehealth: Payer: Self-pay | Admitting: Cardiology

## 2013-11-22 DIAGNOSIS — E785 Hyperlipidemia, unspecified: Secondary | ICD-10-CM | POA: Diagnosis not present

## 2013-11-22 DIAGNOSIS — I509 Heart failure, unspecified: Secondary | ICD-10-CM | POA: Diagnosis not present

## 2013-11-22 DIAGNOSIS — I2510 Atherosclerotic heart disease of native coronary artery without angina pectoris: Secondary | ICD-10-CM | POA: Diagnosis not present

## 2013-11-22 DIAGNOSIS — I10 Essential (primary) hypertension: Secondary | ICD-10-CM | POA: Insufficient documentation

## 2013-11-22 DIAGNOSIS — E669 Obesity, unspecified: Secondary | ICD-10-CM | POA: Insufficient documentation

## 2013-11-22 DIAGNOSIS — Z951 Presence of aortocoronary bypass graft: Secondary | ICD-10-CM | POA: Diagnosis not present

## 2013-11-22 DIAGNOSIS — E119 Type 2 diabetes mellitus without complications: Secondary | ICD-10-CM | POA: Insufficient documentation

## 2013-11-22 DIAGNOSIS — IMO0001 Reserved for inherently not codable concepts without codable children: Secondary | ICD-10-CM | POA: Insufficient documentation

## 2013-11-22 LAB — GLUCOSE, CAPILLARY: Glucose-Capillary: 192 mg/dL — ABNORMAL HIGH (ref 70–99)

## 2013-11-22 NOTE — Telephone Encounter (Signed)
Take Lasix 40 mg bid x 4 days, then go back to 40 qam/20 qpm.

## 2013-11-22 NOTE — Telephone Encounter (Signed)
He can take 60 mg bid x 3 days then back to 40 mg bid.

## 2013-11-22 NOTE — Telephone Encounter (Signed)
Spoke with pt, he is currently taking lasix 40 bid Will resend to dr Aundra Dubin

## 2013-11-22 NOTE — Telephone Encounter (Signed)
New message    [name] calling from cardiac rehab . Patient there now . C/O sob, weight gain 1.8 kg since Monday

## 2013-11-22 NOTE — Telephone Encounter (Signed)
Spoke with Verdis Frederickson from cardiac rehab. Lungs clear, no edema, and O2 sat 95%. Per Verdis Frederickson patient in no distress and she is letting him go home. Patient did miss a dose of Lasix earlier in the week and ate more sodium than usual but doubled Lasix the next day. She will fax information to Dr Aundra Dubin who is in the CHF clinic this am and tomorrow for him to review. Will forward to [place] and Dr Aundra Dubin

## 2013-11-22 NOTE — Progress Notes (Signed)
Patient has a weight gain today of 1.8 kg from Monday's last exercise session.  Oxygen saturation 95% on room air.  Upon assessment lung fields essentially clear no peripheral edema noted. [name] reported some increased shortness of breath today. Patient reported not sleeping well last night. [name] said he took an extra dose of lasix yesterday because he thinks he forgot to take a dose on Monday.  Dr Claris Gladden office called and notified spoke with Alvina Filbert LPN. Will fax exercise flow sheets to Dr. Claris Gladden office for review. Patient instructed to watch his salt intake.

## 2013-11-23 NOTE — Telephone Encounter (Signed)
Per Dr. Aundra Dubin advised wife to increase Lasix to 60 mg BID for 3 days then back to 40 mg BID.  Advised if not feeling any better to call DOD or call on Monday.  She verbalizes understanding and will call if necessary.

## 2013-11-24 ENCOUNTER — Encounter (HOSPITAL_COMMUNITY)
Admission: RE | Admit: 2013-11-24 | Discharge: 2013-11-24 | Disposition: A | Discharge status: Home / Self Care | Payer: Medicare Other | Source: Ambulatory Visit | Attending: Cardiology | Admitting: Cardiology

## 2013-11-24 LAB — GLUCOSE, CAPILLARY: Glucose-Capillary: 167 mg/dL — ABNORMAL HIGH (ref 70–99)

## 2013-11-27 ENCOUNTER — Encounter (HOSPITAL_COMMUNITY): Payer: Medicare Other

## 2013-11-27 ENCOUNTER — Telehealth (HOSPITAL_COMMUNITY): Payer: Self-pay | Admitting: *Deleted

## 2013-11-27 DIAGNOSIS — L570 Actinic keratosis: Secondary | ICD-10-CM | POA: Diagnosis not present

## 2013-11-29 ENCOUNTER — Telehealth: Payer: Self-pay | Admitting: Cardiology

## 2013-11-29 ENCOUNTER — Encounter (HOSPITAL_COMMUNITY): Payer: Medicare Other

## 2013-11-29 ENCOUNTER — Telehealth (HOSPITAL_COMMUNITY): Payer: Self-pay | Admitting: Family Medicine

## 2013-11-29 MED ORDER — CARVEDILOL 6.25 MG PO TABS
6.2500 mg | ORAL_TABLET | Freq: Two times a day (BID) | ORAL | Status: DC
Start: 2013-11-29 — End: 2014-08-03

## 2013-11-29 NOTE — Telephone Encounter (Signed)
Pt advised,verbalized understanding. 

## 2013-11-29 NOTE — Telephone Encounter (Signed)
Follow up     Pt returning Rn call.  Per Rn will call him back.

## 2013-11-29 NOTE — Telephone Encounter (Signed)
His weight Monday was on Cardiac Rehab scale, his weight today was his home scale. I will forward to Dr Aundra Dubin for review

## 2013-11-29 NOTE — Telephone Encounter (Signed)
LMTCB

## 2013-11-29 NOTE — Telephone Encounter (Signed)
He can try to cut back his Coreg to 6.25 mg bid to see if he feels better.

## 2013-11-29 NOTE — Telephone Encounter (Signed)
New problem   Pt has no energy and was 5lbs heavier when he woke up this morning. Pt want to speak to nurse.

## 2013-11-29 NOTE — Telephone Encounter (Signed)
Pt calling because he has no energy and is tired. Pt states once he goes to Cardiac Rehab and exercise there without problems, he just does not feel like going to Cardiac Rehab. He states his BP is 118/69 pulse 55. He states his weight is up 5 pounds from Monday but he does not feel like this is the problem. He feels it is related to one of his medications.

## 2013-11-30 ENCOUNTER — Telehealth (HOSPITAL_COMMUNITY): Payer: Self-pay | Admitting: *Deleted

## 2013-12-01 ENCOUNTER — Encounter (HOSPITAL_COMMUNITY)
Admission: RE | Admit: 2013-12-01 | Discharge: 2013-12-01 | Disposition: A | Discharge status: Home / Self Care | Payer: Medicare Other | Source: Ambulatory Visit | Attending: Cardiology | Admitting: Cardiology

## 2013-12-01 LAB — GLUCOSE, CAPILLARY: Glucose-Capillary: 140 mg/dL — ABNORMAL HIGH (ref 70–99)

## 2013-12-04 ENCOUNTER — Encounter (HOSPITAL_COMMUNITY)
Admission: RE | Admit: 2013-12-04 | Discharge: 2013-12-04 | Disposition: A | Discharge status: Home / Self Care | Payer: Medicare Other | Source: Ambulatory Visit | Attending: Cardiology | Admitting: Cardiology

## 2013-12-04 NOTE — Progress Notes (Signed)
Reviewed [name]'s quality of life questionnaire. [name] had low score in the health and functioning area of the questionnaire. [name] reports he has not had an energy and has been experiencing shortness of breath. The patient states his shortness of breath and energy level has been slowly improving. [name] plans to incorporate walking on the days he does not participate in cardiac rehab. Emotional support given to the patient.  Will fax [name]'s quality of life for Dr Aundra Dubin to review.

## 2013-12-06 ENCOUNTER — Encounter (HOSPITAL_COMMUNITY)
Admission: RE | Admit: 2013-12-06 | Discharge: 2013-12-06 | Disposition: A | Discharge status: Home / Self Care | Payer: Medicare Other | Source: Ambulatory Visit | Attending: Cardiology | Admitting: Cardiology

## 2013-12-08 ENCOUNTER — Encounter (HOSPITAL_COMMUNITY): Payer: Medicare Other

## 2013-12-11 ENCOUNTER — Encounter (HOSPITAL_COMMUNITY)
Admission: RE | Admit: 2013-12-11 | Discharge: 2013-12-11 | Disposition: A | Discharge status: Home / Self Care | Payer: Medicare Other | Source: Ambulatory Visit | Attending: Cardiology | Admitting: Cardiology

## 2013-12-13 ENCOUNTER — Encounter (HOSPITAL_COMMUNITY)
Admission: RE | Admit: 2013-12-13 | Discharge: 2013-12-13 | Disposition: A | Discharge status: Home / Self Care | Payer: Medicare Other | Source: Ambulatory Visit | Attending: Cardiology | Admitting: Cardiology

## 2013-12-15 ENCOUNTER — Encounter (HOSPITAL_COMMUNITY): Payer: Medicare Other

## 2013-12-15 ENCOUNTER — Telehealth (HOSPITAL_COMMUNITY): Payer: Self-pay | Admitting: Family Medicine

## 2013-12-18 ENCOUNTER — Encounter (HOSPITAL_COMMUNITY)
Admission: RE | Admit: 2013-12-18 | Discharge: 2013-12-18 | Disposition: A | Discharge status: Home / Self Care | Payer: Medicare Other | Source: Ambulatory Visit | Attending: Cardiology | Admitting: Cardiology

## 2013-12-20 ENCOUNTER — Encounter (HOSPITAL_COMMUNITY)
Admission: RE | Admit: 2013-12-20 | Discharge: 2013-12-20 | Disposition: A | Discharge status: Home / Self Care | Payer: Medicare Other | Source: Ambulatory Visit | Attending: Cardiology | Admitting: Cardiology

## 2013-12-20 LAB — GLUCOSE, CAPILLARY: Glucose-Capillary: 140 mg/dL — ABNORMAL HIGH (ref 70–99)

## 2013-12-22 ENCOUNTER — Encounter (HOSPITAL_COMMUNITY)
Admission: RE | Admit: 2013-12-22 | Discharge: 2013-12-22 | Disposition: A | Discharge status: Home / Self Care | Payer: Medicare Other | Source: Ambulatory Visit | Attending: Cardiology | Admitting: Cardiology

## 2013-12-22 ENCOUNTER — Encounter (HOSPITAL_COMMUNITY): Payer: Medicare Other

## 2013-12-22 DIAGNOSIS — I2510 Atherosclerotic heart disease of native coronary artery without angina pectoris: Secondary | ICD-10-CM | POA: Diagnosis not present

## 2013-12-22 DIAGNOSIS — Z951 Presence of aortocoronary bypass graft: Secondary | ICD-10-CM | POA: Insufficient documentation

## 2013-12-22 DIAGNOSIS — I509 Heart failure, unspecified: Secondary | ICD-10-CM | POA: Insufficient documentation

## 2013-12-22 DIAGNOSIS — E119 Type 2 diabetes mellitus without complications: Secondary | ICD-10-CM | POA: Insufficient documentation

## 2013-12-22 DIAGNOSIS — IMO0001 Reserved for inherently not codable concepts without codable children: Secondary | ICD-10-CM | POA: Insufficient documentation

## 2013-12-22 DIAGNOSIS — E669 Obesity, unspecified: Secondary | ICD-10-CM | POA: Diagnosis not present

## 2013-12-22 DIAGNOSIS — I10 Essential (primary) hypertension: Secondary | ICD-10-CM | POA: Insufficient documentation

## 2013-12-22 DIAGNOSIS — E785 Hyperlipidemia, unspecified: Secondary | ICD-10-CM | POA: Insufficient documentation

## 2013-12-25 ENCOUNTER — Encounter (HOSPITAL_COMMUNITY): Payer: Medicare Other

## 2013-12-25 ENCOUNTER — Encounter (HOSPITAL_COMMUNITY)
Admission: RE | Admit: 2013-12-25 | Discharge: 2013-12-25 | Disposition: A | Discharge status: Home / Self Care | Payer: Medicare Other | Source: Ambulatory Visit | Attending: Cardiology | Admitting: Cardiology

## 2013-12-25 NOTE — Progress Notes (Signed)
Pt weight up 3kg since last exercise session on 12/22/13.  Pt c/o dyspnea same as usual, with some ankle swelling.  On exam-O2 sat-97% room air, lungs clear, trace pedal edema bilaterally.  Pt reports he is currently taking lasix 40mg  BID.  Pt denies missed doses of medication or increased sodium intake with exception of eating out Friday night.  Phone call to Dr Aundra Dubin, left message for nurse to return call.  Rehab report with weight trend faxed to Dr Aundra Dubin for review.

## 2013-12-27 ENCOUNTER — Telehealth (HOSPITAL_COMMUNITY): Payer: Self-pay | Admitting: Cardiac Rehabilitation

## 2013-12-27 ENCOUNTER — Encounter (HOSPITAL_COMMUNITY)
Admission: RE | Admit: 2013-12-27 | Discharge: 2013-12-27 | Disposition: A | Discharge status: Home / Self Care | Payer: Medicare Other | Source: Ambulatory Visit | Attending: Cardiology | Admitting: Cardiology

## 2013-12-27 ENCOUNTER — Encounter (HOSPITAL_COMMUNITY): Payer: Medicare Other

## 2013-12-27 NOTE — Telephone Encounter (Signed)
Fax order received from Dr. Aundra Dubin in response to recent 3 kg weight gain.  Per Dr. Aundra Dubin, pt instructed to take lasix 60mg  BID for 3 days, then resume Lasix 40mg  BID.  Pt verbalized understanding.

## 2013-12-29 ENCOUNTER — Encounter (HOSPITAL_COMMUNITY): Payer: Medicare Other

## 2013-12-29 ENCOUNTER — Encounter (HOSPITAL_COMMUNITY)
Admission: RE | Admit: 2013-12-29 | Discharge: 2013-12-29 | Disposition: A | Discharge status: Home / Self Care | Payer: Medicare Other | Source: Ambulatory Visit | Attending: Cardiology | Admitting: Cardiology

## 2013-12-29 DIAGNOSIS — L570 Actinic keratosis: Secondary | ICD-10-CM | POA: Diagnosis not present

## 2014-01-01 ENCOUNTER — Encounter (HOSPITAL_COMMUNITY): Payer: Medicare Other

## 2014-01-03 ENCOUNTER — Encounter (HOSPITAL_COMMUNITY): Payer: Medicare Other

## 2014-01-03 ENCOUNTER — Encounter (HOSPITAL_COMMUNITY)
Admission: RE | Admit: 2014-01-03 | Discharge: 2014-01-03 | Disposition: A | Discharge status: Home / Self Care | Payer: Medicare Other | Source: Ambulatory Visit | Attending: Cardiology | Admitting: Cardiology

## 2014-01-03 LAB — GLUCOSE, CAPILLARY: Glucose-Capillary: 157 mg/dL — ABNORMAL HIGH (ref 70–99)

## 2014-01-05 ENCOUNTER — Encounter (HOSPITAL_COMMUNITY): Payer: Medicare Other

## 2014-01-05 ENCOUNTER — Encounter (HOSPITAL_COMMUNITY)
Admission: RE | Admit: 2014-01-05 | Discharge: 2014-01-05 | Disposition: A | Discharge status: Home / Self Care | Payer: Medicare Other | Source: Ambulatory Visit | Attending: Cardiology | Admitting: Cardiology

## 2014-01-08 ENCOUNTER — Encounter (HOSPITAL_COMMUNITY): Payer: Medicare Other

## 2014-01-08 ENCOUNTER — Encounter (HOSPITAL_COMMUNITY)
Admission: RE | Admit: 2014-01-08 | Discharge: 2014-01-08 | Disposition: A | Discharge status: Home / Self Care | Payer: Medicare Other | Source: Ambulatory Visit | Attending: Cardiology | Admitting: Cardiology

## 2014-01-08 ENCOUNTER — Telehealth (HOSPITAL_COMMUNITY): Payer: Self-pay | Admitting: Cardiac Rehabilitation

## 2014-01-08 NOTE — Telephone Encounter (Signed)
Pt informed of Dr Claris Gladden order to increase lasix 60mg  BID for 3 days then resume usual dose.  Pt also instructed to follow strict low sodium diet.  Understanding verbalized

## 2014-01-08 NOTE — Telephone Encounter (Signed)
Message copied by Lowell Guitar on Mon Jan 08, 2014  3:35 PM ------      Message from: [name]      Created: Mon Jan 08, 2014  9:28 AM      Regarding: RE: [facility]       He can do the same again (60 mg bid x 3 days). Watch sodium intake.       ----- Message -----         From: Lowell Guitar, RN         Sent: 01/08/2014   9:11 AM           To: [name], MD      Subject: Cardaic Rehab                                            Dear Dr. Aundra Dubin,            Pt weight up 2.2kg since Friday at Cardiac Rehab today.  (105.6kg up from 103.4kg)  Pt denies dyspnea, however c/o fatigue and finger swelling. Lungs clear.  No pedal edema.  Would you like pt to adjust lasix dose?  He is currently taking 40mg  BID. However, a few weeks ago he increased to 60mg  BID for 3 days for weight gain.              Thank you,      Andi Hence, RN      Cardiac Pulmonary Rehab             ------

## 2014-01-10 ENCOUNTER — Encounter (HOSPITAL_COMMUNITY): Payer: Medicare Other

## 2014-01-10 ENCOUNTER — Encounter (HOSPITAL_COMMUNITY)
Admission: RE | Admit: 2014-01-10 | Discharge: 2014-01-10 | Disposition: A | Discharge status: Home / Self Care | Payer: Medicare Other | Source: Ambulatory Visit | Attending: Cardiology | Admitting: Cardiology

## 2014-01-10 NOTE — Progress Notes (Signed)
Pt arrived at cardiac rehab c/o dizziness this morning. Pt states dizziness worse with head movement.  Orthostatic BP: 120/79 HR- 66 lying, 108/82 HR-70 sitting, 108/78 Hr-73 standing.  Pt states he increased lasix 60mg  BID x2 days with decrease in weight and dyspnea/swelling improved.  Pt weight back down so he will take lasix 40mg  this evening.  Pt states he has not had breakfast this morning for fear of raising his blood sugar, fasting 155.  Pt given gatorade and peanut butter crackers with relief of symptoms. Pt able to exercise without difficulty. Pt also c/o fatigue at home.  Pt also reports episodes of jaw and back pain that comes and goes occasionally, has been chronic since his surgery.    Not associated with activity, relieved on its own.    Pt encouraged to discuss these symptoms with MD/PA at next appointment.   pc to Dr. Claris Gladden office to schedule appointment with  Amie [place], PA June 8 at 12:45.  Pt instructed to call triage nurse for earlier appointment if symptoms unimproved or worsen.  Pt given written and verbal instruction, understanding verbalized.

## 2014-01-12 ENCOUNTER — Encounter (HOSPITAL_COMMUNITY): Payer: Medicare Other

## 2014-01-17 ENCOUNTER — Encounter (HOSPITAL_COMMUNITY)
Admission: RE | Admit: 2014-01-17 | Discharge: 2014-01-17 | Disposition: A | Discharge status: Home / Self Care | Payer: Medicare Other | Source: Ambulatory Visit | Attending: Cardiology | Admitting: Cardiology

## 2014-01-17 ENCOUNTER — Encounter (HOSPITAL_COMMUNITY): Payer: Medicare Other

## 2014-01-19 ENCOUNTER — Encounter (HOSPITAL_COMMUNITY): Payer: Medicare Other

## 2014-01-19 ENCOUNTER — Telehealth (HOSPITAL_COMMUNITY): Payer: Self-pay | Admitting: Family Medicine

## 2014-01-22 ENCOUNTER — Encounter (HOSPITAL_COMMUNITY)
Admission: RE | Admit: 2014-01-22 | Discharge: 2014-01-22 | Disposition: A | Discharge status: Home / Self Care | Payer: Medicare Other | Source: Ambulatory Visit | Attending: Cardiology | Admitting: Cardiology

## 2014-01-22 ENCOUNTER — Encounter (HOSPITAL_COMMUNITY): Payer: Medicare Other

## 2014-01-22 DIAGNOSIS — M2550 Pain in unspecified joint: Secondary | ICD-10-CM | POA: Diagnosis not present

## 2014-01-22 DIAGNOSIS — I10 Essential (primary) hypertension: Secondary | ICD-10-CM | POA: Diagnosis not present

## 2014-01-22 DIAGNOSIS — E669 Obesity, unspecified: Secondary | ICD-10-CM | POA: Diagnosis not present

## 2014-01-22 DIAGNOSIS — I509 Heart failure, unspecified: Secondary | ICD-10-CM | POA: Diagnosis not present

## 2014-01-22 DIAGNOSIS — I2510 Atherosclerotic heart disease of native coronary artery without angina pectoris: Secondary | ICD-10-CM | POA: Insufficient documentation

## 2014-01-22 DIAGNOSIS — M545 Low back pain, unspecified: Secondary | ICD-10-CM | POA: Diagnosis not present

## 2014-01-22 DIAGNOSIS — R5381 Other malaise: Secondary | ICD-10-CM | POA: Diagnosis not present

## 2014-01-22 DIAGNOSIS — E119 Type 2 diabetes mellitus without complications: Secondary | ICD-10-CM | POA: Insufficient documentation

## 2014-01-22 DIAGNOSIS — E559 Vitamin D deficiency, unspecified: Secondary | ICD-10-CM | POA: Diagnosis not present

## 2014-01-22 DIAGNOSIS — E291 Testicular hypofunction: Secondary | ICD-10-CM | POA: Diagnosis not present

## 2014-01-22 DIAGNOSIS — R5383 Other fatigue: Secondary | ICD-10-CM | POA: Diagnosis not present

## 2014-01-22 DIAGNOSIS — E785 Hyperlipidemia, unspecified: Secondary | ICD-10-CM | POA: Diagnosis not present

## 2014-01-22 DIAGNOSIS — Z125 Encounter for screening for malignant neoplasm of prostate: Secondary | ICD-10-CM | POA: Diagnosis not present

## 2014-01-22 DIAGNOSIS — Z951 Presence of aortocoronary bypass graft: Secondary | ICD-10-CM | POA: Insufficient documentation

## 2014-01-22 DIAGNOSIS — IMO0001 Reserved for inherently not codable concepts without codable children: Secondary | ICD-10-CM | POA: Insufficient documentation

## 2014-01-22 DIAGNOSIS — E7800 Pure hypercholesterolemia, unspecified: Secondary | ICD-10-CM | POA: Diagnosis not present

## 2014-01-22 DIAGNOSIS — M159 Polyosteoarthritis, unspecified: Secondary | ICD-10-CM | POA: Diagnosis not present

## 2014-01-24 ENCOUNTER — Encounter: Payer: Self-pay | Admitting: Cardiology

## 2014-01-24 ENCOUNTER — Encounter (HOSPITAL_COMMUNITY)
Admission: RE | Admit: 2014-01-24 | Discharge: 2014-01-24 | Disposition: A | Discharge status: Home / Self Care | Payer: Medicare Other | Source: Ambulatory Visit | Attending: Cardiology | Admitting: Cardiology

## 2014-01-24 ENCOUNTER — Encounter (HOSPITAL_COMMUNITY): Payer: Medicare Other

## 2014-01-25 ENCOUNTER — Ambulatory Visit (INDEPENDENT_AMBULATORY_CARE_PROVIDER_SITE_OTHER): Payer: Medicare Other | Admitting: Physician Assistant

## 2014-01-25 ENCOUNTER — Encounter (INDEPENDENT_AMBULATORY_CARE_PROVIDER_SITE_OTHER): Payer: Self-pay

## 2014-01-25 VITALS — BP 122/76 | HR 72 | Temp 97.8°F | Resp 14 | Ht 69.0 in | Wt 230.0 lb

## 2014-01-25 DIAGNOSIS — Z4651 Encounter for fitting and adjustment of gastric lap band: Secondary | ICD-10-CM

## 2014-01-25 NOTE — Progress Notes (Signed)
  HISTORY: [name] is a 68 y.o.male who received an AP-Large lap-band in May 2010 by Dr. Excell Seltzer. He was last seen two years ago. He has lost close to 5 lbs although he reports having lost down to 180 lbs, most of that regained. He notes that his hunger and portion sizes have both increased over time. He has no issues with persistent regurgitation or reflux. His exercise regimen currently consists of cardiac rehab, but he hopes to get back into the gym following that course of treatment.  VITAL SIGNS: Filed Vitals:   01/25/14 1006  BP: 122/76  Pulse: 72  Temp: 97.8 F (36.6 C)  Resp: 14    PHYSICAL EXAM: Physical exam reveals a very well-appearing 67 y.o.male in no apparent distress Neurologic: Awake, alert, oriented Psych: Bright affect, conversant Respiratory: Breathing even and unlabored. No stridor or wheezing Abdomen: Soft, nontender, nondistended to palpation. Incisions well-healed. No incisional hernias. Port easily palpated. Extremities: Atraumatic, good range of motion.  ASSESMENT: 68 y.o.  male  s/p AP-Large lap-band.   PLAN: The patient's port was accessed with a 20G Huber needle without difficulty. Clear fluid was aspirated and 0.5 mL saline was added to the port to give a total predicted volume of 9 mL. The patient was able to swallow water without difficulty following the procedure and was instructed to take clear liquids for the next 24-48 hours and advance slowly as tolerated. I've asked him to return in one month for re-evaluation or sooner if needed.

## 2014-01-25 NOTE — Patient Instructions (Signed)

## 2014-01-26 ENCOUNTER — Encounter (HOSPITAL_COMMUNITY): Payer: Medicare Other

## 2014-01-26 ENCOUNTER — Encounter (HOSPITAL_COMMUNITY)
Admission: RE | Admit: 2014-01-26 | Discharge: 2014-01-26 | Disposition: A | Discharge status: Home / Self Care | Payer: Medicare Other | Source: Ambulatory Visit | Attending: Cardiology | Admitting: Cardiology

## 2014-01-29 ENCOUNTER — Encounter (HOSPITAL_COMMUNITY)
Admission: RE | Admit: 2014-01-29 | Discharge: 2014-01-29 | Disposition: A | Discharge status: Home / Self Care | Payer: Medicare Other | Source: Ambulatory Visit | Attending: Cardiology | Admitting: Cardiology

## 2014-01-29 ENCOUNTER — Encounter: Payer: Self-pay | Admitting: Physician Assistant

## 2014-01-29 ENCOUNTER — Encounter (HOSPITAL_COMMUNITY): Payer: Medicare Other

## 2014-01-29 ENCOUNTER — Ambulatory Visit (INDEPENDENT_AMBULATORY_CARE_PROVIDER_SITE_OTHER): Payer: Medicare Other | Admitting: Physician Assistant

## 2014-01-29 VITALS — BP 100/70 | HR 56 | Ht 70.0 in | Wt 231.0 lb

## 2014-01-29 DIAGNOSIS — I209 Angina pectoris, unspecified: Secondary | ICD-10-CM

## 2014-01-29 DIAGNOSIS — I2510 Atherosclerotic heart disease of native coronary artery without angina pectoris: Secondary | ICD-10-CM

## 2014-01-29 DIAGNOSIS — R0602 Shortness of breath: Secondary | ICD-10-CM | POA: Diagnosis not present

## 2014-01-29 DIAGNOSIS — I959 Hypotension, unspecified: Secondary | ICD-10-CM | POA: Insufficient documentation

## 2014-01-29 DIAGNOSIS — I5032 Chronic diastolic (congestive) heart failure: Secondary | ICD-10-CM | POA: Diagnosis not present

## 2014-01-29 DIAGNOSIS — I10 Essential (primary) hypertension: Secondary | ICD-10-CM

## 2014-01-29 DIAGNOSIS — I509 Heart failure, unspecified: Secondary | ICD-10-CM

## 2014-01-29 MED ORDER — FUROSEMIDE 40 MG PO TABS: 40.0000 mg | ORAL_TABLET | Freq: Every day | ORAL | Status: DC

## 2014-01-29 MED ORDER — ISOSORBIDE MONONITRATE ER 30 MG PO TB24: 30.0000 mg | ORAL_TABLET | Freq: Every day | ORAL | Status: DC
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
## 2014-01-29 NOTE — Assessment & Plan Note (Signed)
Heart failure has been stable. We are decreasing his Lasix because of dizziness. He will keep a close eye on his weight and edema. He can take an extra Lasix if needed and call us if he has any weight gain or edema.

## 2014-01-29 NOTE — Patient Instructions (Addendum)
Your physician recommends that you schedule a follow-up appointment in: [place] physician has recommended you make the following change in your medication:   DECREASE YOUR LASIX 40 [place]  Your physician recommends that you continue on your current medications as directed. Please refer to the Current Medication list given to you today.  2 Gram Low Sodium Diet A 2 gram sodium diet restricts the amount of sodium in the diet to no more than 2 g or 2000 mg daily. Limiting the amount of sodium is often used to help lower blood pressure. It is important if you have heart, liver, or kidney problems. Many foods contain sodium for flavor and sometimes as a preservative. When the amount of sodium in a diet needs to be low, it is important to know what to look for when choosing foods and drinks. The following includes some information and guidelines to help make it easier for you to adapt to a low sodium diet. QUICK TIPS  Do not add salt to food.  Avoid convenience items and fast food.  Choose unsalted snack foods.  Buy lower sodium products, often labeled as "lower sodium" or "no salt added."  Check food labels to learn how much sodium is in 1 serving.  When eating at a restaurant, ask that your food be prepared with less salt or none, if possible. READING FOOD LABELS FOR SODIUM INFORMATION The nutrition facts label is a good place to find how much sodium is in foods. Look for products with no more than 500 to 600 mg of sodium per meal and no more than 150 mg per serving. Remember that 2 g = 2000 mg. The food label may also list foods as:  Sodium-free: Less than 5 mg in a serving.  Very low sodium: 35 mg or less in a serving.  Low-sodium: 140 mg or less in a serving.  Light in sodium: 50% less sodium in a serving. For example, if a food that usually has 300 mg of sodium is changed to become light in sodium, it  will have 150 mg of sodium.  Reduced sodium: 25% less sodium in a serving. For example, if a food that usually has 400 mg of sodium is changed to reduced sodium, it will have 300 mg of sodium. CHOOSING FOODS Grains  Avoid: Salted crackers and snack items. Some cereals, including instant hot cereals. Bread stuffing and biscuit mixes. Seasoned rice or pasta mixes.  Choose: Unsalted snack items. Low-sodium cereals, oats, puffed wheat and rice, shredded wheat. English muffins and bread. Pasta. Meats  Avoid: Salted, canned, smoked, spiced, pickled meats, including fish and poultry. Bacon, ham, sausage, cold cuts, hot dogs, anchovies.  Choose: Low-sodium canned tuna and salmon. Fresh or frozen meat, poultry, and fish. Dairy  Avoid: Processed cheese and spreads. Cottage cheese. Buttermilk and condensed milk. Regular cheese.  Choose: Milk. Low-sodium cottage cheese. Yogurt. Sour cream. Low-sodium cheese. Fruits and Vegetables  Avoid: Regular canned vegetables. Regular canned tomato sauce and paste. Frozen vegetables in sauces. Olives. Angie Fava. Relishes. Sauerkraut.  Choose: Low-sodium canned vegetables. Low-sodium tomato sauce and paste. Frozen or fresh vegetables. Fresh and frozen fruit. Condiments  Avoid: Canned and packaged gravies. Worcestershire sauce. Tartar sauce. Barbecue sauce. Soy sauce. Steak sauce. Ketchup. Onion, garlic, and table salt. Meat flavorings and tenderizers.  Choose: Fresh and dried herbs and spices. Low-sodium varieties of mustard and ketchup. Lemon juice. Tabasco sauce.  Horseradish. SAMPLE 2 GRAM SODIUM MEAL PLAN Breakfast / Sodium (mg)  1 cup low-fat milk / A999333 mg  2 slices whole-wheat toast / 270 mg  1 tbs heart-healthy margarine / 153 mg  1 hard-boiled egg / 139 mg  1 small orange / 0 mg Lunch / Sodium (mg)  1 cup raw carrots / 76 mg   cup hummus / 298 mg  1 cup low-fat milk / 143 mg   cup red grapes / 2 mg  1 whole-wheat pita bread / 356  mg Dinner / Sodium (mg)  1 cup whole-wheat pasta / 2 mg  1 cup low-sodium tomato sauce / 73 mg  3 oz lean ground beef / 57 mg  1 small side salad (1 cup raw spinach leaves,  cup cucumber,  cup yellow bell pepper) with 1 tsp olive oil and 1 tsp red wine vinegar / 25 mg Snack / Sodium (mg)  1 container low-fat vanilla yogurt / 107 mg  3 graham cracker squares / 127 mg Nutrient Analysis  Calories: 2033  Protein: 77 g  Carbohydrate: 282 g  Fat: 72 g  Sodium: 1971 mg Document Released: 08/10/2005 Document Revised: 11/02/2011 Document Reviewed: 11/11/2009 [employer] Patient Information 2014 [employer].

## 2014-01-29 NOTE — Assessment & Plan Note (Signed)
Patient's blood pressures been low and he's become dizzy at cardiac rehabilitation ever since his Imdur and Lasix was increased. We will decrease his Imdur to 30 mg once daily. He will decrease his Lasix to 40 mg once daily. He will weigh himself daily and take extra Lasix as needed. He has had no further chest pain. He will call us if he has any increase in chest pain on the lower dose Imdur.

## 2014-01-31 ENCOUNTER — Encounter (HOSPITAL_COMMUNITY)
Admission: RE | Admit: 2014-01-31 | Discharge: 2014-01-31 | Disposition: A | Discharge status: Home / Self Care | Payer: Medicare Other | Source: Ambulatory Visit | Attending: Cardiology | Admitting: Cardiology

## 2014-01-31 ENCOUNTER — Encounter (HOSPITAL_COMMUNITY): Payer: Medicare Other

## 2014-02-02 ENCOUNTER — Encounter (HOSPITAL_COMMUNITY): Payer: Medicare Other

## 2014-02-02 ENCOUNTER — Encounter (HOSPITAL_COMMUNITY)
Admission: RE | Admit: 2014-02-02 | Discharge: 2014-02-02 | Disposition: A | Discharge status: Home / Self Care | Payer: Medicare Other | Source: Ambulatory Visit | Attending: Cardiology | Admitting: Cardiology

## 2014-02-05 ENCOUNTER — Encounter (HOSPITAL_COMMUNITY)
Admission: RE | Admit: 2014-02-05 | Discharge: 2014-02-05 | Disposition: A | Discharge status: Home / Self Care | Payer: Medicare Other | Source: Ambulatory Visit | Attending: Cardiology | Admitting: Cardiology

## 2014-02-05 ENCOUNTER — Encounter (HOSPITAL_COMMUNITY): Payer: Medicare Other

## 2014-02-06 ENCOUNTER — Encounter: Payer: Self-pay | Admitting: Cardiology

## 2014-02-07 ENCOUNTER — Encounter (HOSPITAL_COMMUNITY)
Admission: RE | Admit: 2014-02-07 | Discharge: 2014-02-07 | Disposition: A | Discharge status: Home / Self Care | Payer: Medicare Other | Source: Ambulatory Visit | Attending: Cardiology | Admitting: Cardiology

## 2014-02-07 ENCOUNTER — Encounter (HOSPITAL_COMMUNITY): Payer: Medicare Other

## 2014-02-09 ENCOUNTER — Encounter (HOSPITAL_COMMUNITY)
Admission: RE | Admit: 2014-02-09 | Discharge: 2014-02-09 | Disposition: A | Discharge status: Home / Self Care | Payer: Medicare Other | Source: Ambulatory Visit | Attending: Cardiology | Admitting: Cardiology

## 2014-02-09 ENCOUNTER — Encounter (HOSPITAL_COMMUNITY): Payer: Medicare Other

## 2014-02-12 ENCOUNTER — Encounter (HOSPITAL_COMMUNITY)
Admission: RE | Admit: 2014-02-12 | Discharge: 2014-02-12 | Disposition: A | Discharge status: Home / Self Care | Payer: Medicare Other | Source: Ambulatory Visit | Attending: Cardiology | Admitting: Cardiology

## 2014-02-14 ENCOUNTER — Encounter (HOSPITAL_COMMUNITY)
Admission: RE | Admit: 2014-02-14 | Discharge: 2014-02-14 | Disposition: A | Discharge status: Home / Self Care | Payer: Medicare Other | Source: Ambulatory Visit | Attending: Cardiology | Admitting: Cardiology

## 2014-02-14 ENCOUNTER — Encounter (HOSPITAL_COMMUNITY): Payer: Self-pay

## 2014-02-14 NOTE — Progress Notes (Addendum)
Pt graduated from cardiac rehab program today.  Medication list reconciled.  PHQ9 score-  0.  Pt has made significant lifestyle changes and should be commended for his success. Pt feels he has "exceeded" his rehab goals by significantly increasing his strength, stamina and energy.  Pt symptoms of fatigue and dizziness are resolved with medication changes per cardiology.  Pt self reflected quality of life scores have greatly increased.  Pt states this is due to his increased ability to perform tasks without symptoms.  Pt encouraged to continue exercise program to maintain his functional ability.  Pt plans to continue exercising on his own walking and going to the gym.

## 2014-02-21 DIAGNOSIS — Z85828 Personal history of other malignant neoplasm of skin: Secondary | ICD-10-CM | POA: Diagnosis not present

## 2014-02-21 DIAGNOSIS — L821 Other seborrheic keratosis: Secondary | ICD-10-CM | POA: Diagnosis not present

## 2014-02-21 DIAGNOSIS — L570 Actinic keratosis: Secondary | ICD-10-CM | POA: Diagnosis not present

## 2014-02-22 ENCOUNTER — Encounter (INDEPENDENT_AMBULATORY_CARE_PROVIDER_SITE_OTHER): Payer: Medicare Other

## 2014-04-19 DIAGNOSIS — M5380 Other specified dorsopathies, site unspecified: Secondary | ICD-10-CM | POA: Diagnosis not present

## 2014-04-19 DIAGNOSIS — M999 Biomechanical lesion, unspecified: Secondary | ICD-10-CM | POA: Diagnosis not present

## 2014-04-19 DIAGNOSIS — IMO0002 Reserved for concepts with insufficient information to code with codable children: Secondary | ICD-10-CM | POA: Diagnosis not present

## 2014-04-19 DIAGNOSIS — M5137 Other intervertebral disc degeneration, lumbosacral region: Secondary | ICD-10-CM | POA: Diagnosis not present

## 2014-04-20 DIAGNOSIS — IMO0002 Reserved for concepts with insufficient information to code with codable children: Secondary | ICD-10-CM | POA: Diagnosis not present

## 2014-04-20 DIAGNOSIS — M5137 Other intervertebral disc degeneration, lumbosacral region: Secondary | ICD-10-CM | POA: Diagnosis not present

## 2014-04-20 DIAGNOSIS — M999 Biomechanical lesion, unspecified: Secondary | ICD-10-CM | POA: Diagnosis not present

## 2014-04-20 DIAGNOSIS — M5380 Other specified dorsopathies, site unspecified: Secondary | ICD-10-CM | POA: Diagnosis not present

## 2014-04-23 DIAGNOSIS — M5380 Other specified dorsopathies, site unspecified: Secondary | ICD-10-CM | POA: Diagnosis not present

## 2014-04-23 DIAGNOSIS — M5137 Other intervertebral disc degeneration, lumbosacral region: Secondary | ICD-10-CM | POA: Diagnosis not present

## 2014-04-23 DIAGNOSIS — IMO0002 Reserved for concepts with insufficient information to code with codable children: Secondary | ICD-10-CM | POA: Diagnosis not present

## 2014-04-23 DIAGNOSIS — M999 Biomechanical lesion, unspecified: Secondary | ICD-10-CM | POA: Diagnosis not present

## 2014-04-24 DIAGNOSIS — J329 Chronic sinusitis, unspecified: Secondary | ICD-10-CM | POA: Diagnosis not present

## 2014-04-25 ENCOUNTER — Encounter: Payer: Self-pay | Admitting: Cardiology

## 2014-04-25 ENCOUNTER — Ambulatory Visit (INDEPENDENT_AMBULATORY_CARE_PROVIDER_SITE_OTHER): Payer: Medicare Other | Admitting: Cardiology

## 2014-04-25 VITALS — BP 110/70 | HR 68 | Ht 70.0 in | Wt 227.0 lb

## 2014-04-25 DIAGNOSIS — I5032 Chronic diastolic (congestive) heart failure: Secondary | ICD-10-CM | POA: Diagnosis not present

## 2014-04-25 DIAGNOSIS — M5380 Other specified dorsopathies, site unspecified: Secondary | ICD-10-CM | POA: Diagnosis not present

## 2014-04-25 DIAGNOSIS — M5137 Other intervertebral disc degeneration, lumbosacral region: Secondary | ICD-10-CM | POA: Diagnosis not present

## 2014-04-25 DIAGNOSIS — E785 Hyperlipidemia, unspecified: Secondary | ICD-10-CM | POA: Diagnosis not present

## 2014-04-25 DIAGNOSIS — I209 Angina pectoris, unspecified: Secondary | ICD-10-CM

## 2014-04-25 DIAGNOSIS — IMO0002 Reserved for concepts with insufficient information to code with codable children: Secondary | ICD-10-CM | POA: Diagnosis not present

## 2014-04-25 DIAGNOSIS — I509 Heart failure, unspecified: Secondary | ICD-10-CM

## 2014-04-25 DIAGNOSIS — I2510 Atherosclerotic heart disease of native coronary artery without angina pectoris: Secondary | ICD-10-CM

## 2014-04-25 DIAGNOSIS — M999 Biomechanical lesion, unspecified: Secondary | ICD-10-CM | POA: Diagnosis not present

## 2014-04-25 NOTE — Patient Instructions (Signed)
Your physician wants you to follow-up in: 6 months with Dr Aundra Dubin. (March 2016).  You will receive a reminder letter in the mail two months in advance. If you don't receive a letter, please call our office to schedule the follow-up appointment.

## 2014-04-26 NOTE — Progress Notes (Signed)
Patient ID: [name], male   DOB: May 17, 1946, 68 y.o.   MRN: [id number] PCP: Dr Harrington Challenger  68 yo with history of CAD s/p CABG and obesity s/p lap banding procedure presents for cardiology followup.  Given exertional dyspnea and chest tightness, he had LHC in 1/15.  This showed no change from prior.  Grafts were patent and there was severe stenosis in distal D1, not amenable to PCI.  I started him on Imdur and increased his Lasix. He has subsequently stopped Imdur.   Patient has been doing well recently. No chest pain or NTG use.  No exertional dyspnea. He continues to work part-time.  He has completed cardiac rehab.  Weight is down 8 lbs.   Labs (11/12): LDL 70 Labs (5/13): K 4.1, creatinine 1.1, BNP 48 Labs (12/13): K 3.4, creatinine 1.07, LDL 59, HDL 37 Labs (1/15): K 4.3, creatinine 1.2, HCT 40.3 Labs (3/15): LDL 38, HDL 81, BNP 55, K 4, creatinine 1.2 Labs (6/15): K 4.2, creatinine 1.4, LFTs normal, LDL 66, HDL 35  PMH: 1. OSA: on CPAP.  2. Diabetes mellitus 3. Allergic rhinitis 4. CAD: s/p CABG 1995.  LHC (10/11) with 90% distal D1 (patent proximal D1 stent), total occlusion CFX, total occlusion RCA, no significant stenoses in LAD, SVG-PDA patent, SVG-OM patent, EF 55%.  Medical management.  LHC (5/13) with patent grafts and 90% distal D1 stenosis (no significant change from 10/11).  LHC (1/15) with patent grafts, 75% ISR in D1 stent, 90% stenosis distal D1 (small caliber). D1 not amenable to intervention.  5. Obesity: lap-banding in 5/11.  6. Diastolic CHF: Echo (99991111) with EF 55-60%.   7. TIA: Carotid dopplers in 11/11 with 0-39% bilateral stenosis.  8. OA: Knees, C-spine. TKR 9/13.  9. HTN 10. Hyperlipidemia 11. Diverticulosis 12. Chronic diastolic CHF: Echo ([id number]) with EF 60-65%, mildly dilated RV with normal systolic function.   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  Lives in [place].   FH: Noncontributory.    Current Outpatient Prescriptions   Medication Sig Dispense Refill  . amoxicillin (AMOXIL) 500 MG capsule Take 500 mg by mouth 2 (two) times daily.      Marland Kitchen azithromycin (ZITHROMAX) 250 MG tablet       . carvedilol (COREG) 6.25 MG tablet Take 1 tablet (6.25 mg total) by mouth 2 (two) times daily.  60 tablet  6  . fluticasone (FLONASE) 50 MCG/ACT nasal spray Place 1 spray into both nostrils daily as needed for allergies or rhinitis.      . furosemide (LASIX) 40 MG tablet Take 60 mg by mouth daily. Pt takes 60mg  qAM(1.5 tab daily)      . isosorbide mononitrate (IMDUR) 30 MG 24 hr tablet Take 15 mg by mouth daily. Pt takes 0.5 tab daily      . metFORMIN (GLUCOPHAGE) 500 MG tablet Take 500 mg by mouth 2 (two) times daily with a meal.      . mirtazapine (REMERON) 15 MG tablet Take 15 mg by mouth at bedtime.      . nitroGLYCERIN (NITROSTAT) 0.4 MG SL tablet Place 1 tablet (0.4 mg total) under the tongue every 5 (five) minutes as needed for chest pain.  25 tablet  3  . potassium chloride SA (K-DUR,KLOR-CON) 20 MEQ tablet Take by mouth daily. 1 and 1/2 tablets (30 mEq) daily      . rosuvastatin (CRESTOR) 10 MG tablet Take 10 mg by mouth daily.      . tadalafil (  CIALIS) 20 MG tablet Take 20 mg by mouth daily as needed for erectile dysfunction.       No current facility-administered medications for this visit.    BP 110/70  Pulse 68  Ht 5\' 10"  (1.778 m)  Wt 227 lb (102.967 kg)  BMI 32.57 kg/m2  SpO2 98% General: NAD Neck: Thick, JVP 7 cm, no thyromegaly or thyroid nodule.  Lungs: Clear to auscultation bilaterally with normal respiratory effort. CV: Nondisplaced PMI.  Heart regular S1/S2, no S3/S4, no murmur.  No peripheral edema.  No carotid bruit.  Normal pedal pulses.  Abdomen: Soft, nontender, no hepatosplenomegaly, no distention.  Neurologic: Alert and oriented x 3.  Psych: Normal affect. Extremities: No clubbing or cyanosis.   Assessment/Plan: 1. Diastolic CHF: Patient is doing well on current Lasix dose.  I suspect that he  is near-euvolemic.  I will continue the current dose of Lasix and KCl.  Recent BMET was ok.  2. CAD: Most recent cardiac cath showed stable coronary anatomy.  He has severe stenosis in a distal D1 that is not amenable to intervention. He is not having chest pain. Continue ASA 81, statin, Coreg.   3. Hyperlipidemia: Good lipids in 6/15.   [name] 04/26/2014

## 2014-04-27 DIAGNOSIS — M999 Biomechanical lesion, unspecified: Secondary | ICD-10-CM | POA: Diagnosis not present

## 2014-04-27 DIAGNOSIS — IMO0002 Reserved for concepts with insufficient information to code with codable children: Secondary | ICD-10-CM | POA: Diagnosis not present

## 2014-04-27 DIAGNOSIS — M5380 Other specified dorsopathies, site unspecified: Secondary | ICD-10-CM | POA: Diagnosis not present

## 2014-04-27 DIAGNOSIS — M5137 Other intervertebral disc degeneration, lumbosacral region: Secondary | ICD-10-CM | POA: Diagnosis not present

## 2014-05-02 DIAGNOSIS — M999 Biomechanical lesion, unspecified: Secondary | ICD-10-CM | POA: Diagnosis not present

## 2014-05-02 DIAGNOSIS — M5137 Other intervertebral disc degeneration, lumbosacral region: Secondary | ICD-10-CM | POA: Diagnosis not present

## 2014-05-02 DIAGNOSIS — M5380 Other specified dorsopathies, site unspecified: Secondary | ICD-10-CM | POA: Diagnosis not present

## 2014-05-02 DIAGNOSIS — IMO0002 Reserved for concepts with insufficient information to code with codable children: Secondary | ICD-10-CM | POA: Diagnosis not present

## 2014-05-04 DIAGNOSIS — M5137 Other intervertebral disc degeneration, lumbosacral region: Secondary | ICD-10-CM | POA: Diagnosis not present

## 2014-05-04 DIAGNOSIS — IMO0002 Reserved for concepts with insufficient information to code with codable children: Secondary | ICD-10-CM | POA: Diagnosis not present

## 2014-05-04 DIAGNOSIS — M999 Biomechanical lesion, unspecified: Secondary | ICD-10-CM | POA: Diagnosis not present

## 2014-05-04 DIAGNOSIS — M5380 Other specified dorsopathies, site unspecified: Secondary | ICD-10-CM | POA: Diagnosis not present

## 2014-05-09 DIAGNOSIS — M5137 Other intervertebral disc degeneration, lumbosacral region: Secondary | ICD-10-CM | POA: Diagnosis not present

## 2014-05-09 DIAGNOSIS — IMO0002 Reserved for concepts with insufficient information to code with codable children: Secondary | ICD-10-CM | POA: Diagnosis not present

## 2014-05-09 DIAGNOSIS — M5380 Other specified dorsopathies, site unspecified: Secondary | ICD-10-CM | POA: Diagnosis not present

## 2014-05-09 DIAGNOSIS — M999 Biomechanical lesion, unspecified: Secondary | ICD-10-CM | POA: Diagnosis not present

## 2014-05-11 DIAGNOSIS — IMO0002 Reserved for concepts with insufficient information to code with codable children: Secondary | ICD-10-CM | POA: Diagnosis not present

## 2014-05-11 DIAGNOSIS — M5380 Other specified dorsopathies, site unspecified: Secondary | ICD-10-CM | POA: Diagnosis not present

## 2014-05-11 DIAGNOSIS — M999 Biomechanical lesion, unspecified: Secondary | ICD-10-CM | POA: Diagnosis not present

## 2014-05-11 DIAGNOSIS — M5137 Other intervertebral disc degeneration, lumbosacral region: Secondary | ICD-10-CM | POA: Diagnosis not present

## 2014-05-23 DIAGNOSIS — M999 Biomechanical lesion, unspecified: Secondary | ICD-10-CM | POA: Diagnosis not present

## 2014-05-23 DIAGNOSIS — M5137 Other intervertebral disc degeneration, lumbosacral region: Secondary | ICD-10-CM | POA: Diagnosis not present

## 2014-05-23 DIAGNOSIS — IMO0002 Reserved for concepts with insufficient information to code with codable children: Secondary | ICD-10-CM | POA: Diagnosis not present

## 2014-05-23 DIAGNOSIS — M5380 Other specified dorsopathies, site unspecified: Secondary | ICD-10-CM | POA: Diagnosis not present

## 2014-05-25 DIAGNOSIS — M25511 Pain in right shoulder: Secondary | ICD-10-CM | POA: Diagnosis not present

## 2014-05-25 DIAGNOSIS — S40011A Contusion of right shoulder, initial encounter: Secondary | ICD-10-CM | POA: Diagnosis not present

## 2014-05-25 DIAGNOSIS — M75101 Unspecified rotator cuff tear or rupture of right shoulder, not specified as traumatic: Secondary | ICD-10-CM | POA: Diagnosis not present

## 2014-05-25 DIAGNOSIS — S43421A Sprain of right rotator cuff capsule, initial encounter: Secondary | ICD-10-CM | POA: Diagnosis not present

## 2014-05-30 DIAGNOSIS — M5408 Panniculitis affecting regions of neck and back, sacral and sacrococcygeal region: Secondary | ICD-10-CM | POA: Diagnosis not present

## 2014-05-30 DIAGNOSIS — M9904 Segmental and somatic dysfunction of sacral region: Secondary | ICD-10-CM | POA: Diagnosis not present

## 2014-05-30 DIAGNOSIS — M5136 Other intervertebral disc degeneration, lumbar region: Secondary | ICD-10-CM | POA: Diagnosis not present

## 2014-05-30 DIAGNOSIS — M9903 Segmental and somatic dysfunction of lumbar region: Secondary | ICD-10-CM | POA: Diagnosis not present

## 2014-05-30 DIAGNOSIS — M9902 Segmental and somatic dysfunction of thoracic region: Secondary | ICD-10-CM | POA: Diagnosis not present

## 2014-05-30 DIAGNOSIS — M5416 Radiculopathy, lumbar region: Secondary | ICD-10-CM | POA: Diagnosis not present

## 2014-06-06 DIAGNOSIS — M5416 Radiculopathy, lumbar region: Secondary | ICD-10-CM | POA: Diagnosis not present

## 2014-06-06 DIAGNOSIS — S43421D Sprain of right rotator cuff capsule, subsequent encounter: Secondary | ICD-10-CM | POA: Diagnosis not present

## 2014-06-06 DIAGNOSIS — M5136 Other intervertebral disc degeneration, lumbar region: Secondary | ICD-10-CM | POA: Diagnosis not present

## 2014-06-06 DIAGNOSIS — S40011D Contusion of right shoulder, subsequent encounter: Secondary | ICD-10-CM | POA: Diagnosis not present

## 2014-06-06 DIAGNOSIS — M5408 Panniculitis affecting regions of neck and back, sacral and sacrococcygeal region: Secondary | ICD-10-CM | POA: Diagnosis not present

## 2014-06-06 DIAGNOSIS — M9903 Segmental and somatic dysfunction of lumbar region: Secondary | ICD-10-CM | POA: Diagnosis not present

## 2014-06-06 DIAGNOSIS — M25511 Pain in right shoulder: Secondary | ICD-10-CM | POA: Diagnosis not present

## 2014-06-06 DIAGNOSIS — M9904 Segmental and somatic dysfunction of sacral region: Secondary | ICD-10-CM | POA: Diagnosis not present

## 2014-06-06 DIAGNOSIS — M75101 Unspecified rotator cuff tear or rupture of right shoulder, not specified as traumatic: Secondary | ICD-10-CM | POA: Diagnosis not present

## 2014-06-06 DIAGNOSIS — M9902 Segmental and somatic dysfunction of thoracic region: Secondary | ICD-10-CM | POA: Diagnosis not present

## 2014-06-14 DIAGNOSIS — M5414 Radiculopathy, thoracic region: Secondary | ICD-10-CM | POA: Diagnosis not present

## 2014-06-14 DIAGNOSIS — M5137 Other intervertebral disc degeneration, lumbosacral region: Secondary | ICD-10-CM | POA: Diagnosis not present

## 2014-06-14 DIAGNOSIS — M9902 Segmental and somatic dysfunction of thoracic region: Secondary | ICD-10-CM | POA: Diagnosis not present

## 2014-06-14 DIAGNOSIS — M5432 Sciatica, left side: Secondary | ICD-10-CM | POA: Diagnosis not present

## 2014-06-14 DIAGNOSIS — M9904 Segmental and somatic dysfunction of sacral region: Secondary | ICD-10-CM | POA: Diagnosis not present

## 2014-06-14 DIAGNOSIS — M9903 Segmental and somatic dysfunction of lumbar region: Secondary | ICD-10-CM | POA: Diagnosis not present

## 2014-06-18 DIAGNOSIS — M5432 Sciatica, left side: Secondary | ICD-10-CM | POA: Diagnosis not present

## 2014-06-18 DIAGNOSIS — M9903 Segmental and somatic dysfunction of lumbar region: Secondary | ICD-10-CM | POA: Diagnosis not present

## 2014-06-18 DIAGNOSIS — M5137 Other intervertebral disc degeneration, lumbosacral region: Secondary | ICD-10-CM | POA: Diagnosis not present

## 2014-06-18 DIAGNOSIS — M9904 Segmental and somatic dysfunction of sacral region: Secondary | ICD-10-CM | POA: Diagnosis not present

## 2014-06-18 DIAGNOSIS — M9902 Segmental and somatic dysfunction of thoracic region: Secondary | ICD-10-CM | POA: Diagnosis not present

## 2014-06-18 DIAGNOSIS — M5414 Radiculopathy, thoracic region: Secondary | ICD-10-CM | POA: Diagnosis not present

## 2014-06-20 DIAGNOSIS — M5414 Radiculopathy, thoracic region: Secondary | ICD-10-CM | POA: Diagnosis not present

## 2014-06-20 DIAGNOSIS — M9904 Segmental and somatic dysfunction of sacral region: Secondary | ICD-10-CM | POA: Diagnosis not present

## 2014-06-20 DIAGNOSIS — M5137 Other intervertebral disc degeneration, lumbosacral region: Secondary | ICD-10-CM | POA: Diagnosis not present

## 2014-06-20 DIAGNOSIS — M9902 Segmental and somatic dysfunction of thoracic region: Secondary | ICD-10-CM | POA: Diagnosis not present

## 2014-06-20 DIAGNOSIS — M5432 Sciatica, left side: Secondary | ICD-10-CM | POA: Diagnosis not present

## 2014-06-20 DIAGNOSIS — M9903 Segmental and somatic dysfunction of lumbar region: Secondary | ICD-10-CM | POA: Diagnosis not present

## 2014-06-27 DIAGNOSIS — M5137 Other intervertebral disc degeneration, lumbosacral region: Secondary | ICD-10-CM | POA: Diagnosis not present

## 2014-06-27 DIAGNOSIS — M9903 Segmental and somatic dysfunction of lumbar region: Secondary | ICD-10-CM | POA: Diagnosis not present

## 2014-06-27 DIAGNOSIS — M5432 Sciatica, left side: Secondary | ICD-10-CM | POA: Diagnosis not present

## 2014-06-27 DIAGNOSIS — M9904 Segmental and somatic dysfunction of sacral region: Secondary | ICD-10-CM | POA: Diagnosis not present

## 2014-06-27 DIAGNOSIS — M9902 Segmental and somatic dysfunction of thoracic region: Secondary | ICD-10-CM | POA: Diagnosis not present

## 2014-06-27 DIAGNOSIS — M5414 Radiculopathy, thoracic region: Secondary | ICD-10-CM | POA: Diagnosis not present

## 2014-07-09 DIAGNOSIS — J019 Acute sinusitis, unspecified: Secondary | ICD-10-CM | POA: Diagnosis not present

## 2014-07-09 DIAGNOSIS — I2510 Atherosclerotic heart disease of native coronary artery without angina pectoris: Secondary | ICD-10-CM | POA: Diagnosis not present

## 2014-07-09 DIAGNOSIS — J309 Allergic rhinitis, unspecified: Secondary | ICD-10-CM | POA: Diagnosis not present

## 2014-07-09 DIAGNOSIS — J40 Bronchitis, not specified as acute or chronic: Secondary | ICD-10-CM | POA: Diagnosis not present

## 2014-07-12 DIAGNOSIS — M2392 Unspecified internal derangement of left knee: Secondary | ICD-10-CM | POA: Diagnosis not present

## 2014-07-12 DIAGNOSIS — M2242 Chondromalacia patellae, left knee: Secondary | ICD-10-CM | POA: Diagnosis not present

## 2014-07-12 DIAGNOSIS — M25562 Pain in left knee: Secondary | ICD-10-CM | POA: Diagnosis not present

## 2014-07-12 DIAGNOSIS — M25662 Stiffness of left knee, not elsewhere classified: Secondary | ICD-10-CM | POA: Diagnosis not present

## 2014-07-17 DIAGNOSIS — S83232D Complex tear of medial meniscus, current injury, left knee, subsequent encounter: Secondary | ICD-10-CM | POA: Diagnosis not present

## 2014-07-17 DIAGNOSIS — M25662 Stiffness of left knee, not elsewhere classified: Secondary | ICD-10-CM | POA: Diagnosis not present

## 2014-07-17 DIAGNOSIS — M2392 Unspecified internal derangement of left knee: Secondary | ICD-10-CM | POA: Diagnosis not present

## 2014-07-25 DIAGNOSIS — K219 Gastro-esophageal reflux disease without esophagitis: Secondary | ICD-10-CM | POA: Diagnosis not present

## 2014-07-25 DIAGNOSIS — I10 Essential (primary) hypertension: Secondary | ICD-10-CM | POA: Diagnosis not present

## 2014-07-25 DIAGNOSIS — M179 Osteoarthritis of knee, unspecified: Secondary | ICD-10-CM | POA: Diagnosis not present

## 2014-07-25 DIAGNOSIS — Z79899 Other long term (current) drug therapy: Secondary | ICD-10-CM | POA: Diagnosis not present

## 2014-07-25 DIAGNOSIS — E119 Type 2 diabetes mellitus without complications: Secondary | ICD-10-CM | POA: Diagnosis not present

## 2014-07-25 DIAGNOSIS — E780 Pure hypercholesterolemia: Secondary | ICD-10-CM | POA: Diagnosis not present

## 2014-07-25 DIAGNOSIS — E291 Testicular hypofunction: Secondary | ICD-10-CM | POA: Diagnosis not present

## 2014-07-25 DIAGNOSIS — E559 Vitamin D deficiency, unspecified: Secondary | ICD-10-CM | POA: Diagnosis not present

## 2014-08-02 ENCOUNTER — Encounter (HOSPITAL_COMMUNITY): Payer: Self-pay | Admitting: Cardiology

## 2014-08-03 ENCOUNTER — Other Ambulatory Visit: Payer: Self-pay | Admitting: Cardiology

## 2014-08-28 DIAGNOSIS — H02831 Dermatochalasis of right upper eyelid: Secondary | ICD-10-CM | POA: Diagnosis not present

## 2014-08-28 DIAGNOSIS — E119 Type 2 diabetes mellitus without complications: Secondary | ICD-10-CM | POA: Diagnosis not present

## 2014-08-28 DIAGNOSIS — H04123 Dry eye syndrome of bilateral lacrimal glands: Secondary | ICD-10-CM | POA: Diagnosis not present

## 2014-08-28 DIAGNOSIS — H02834 Dermatochalasis of left upper eyelid: Secondary | ICD-10-CM | POA: Diagnosis not present

## 2014-08-28 DIAGNOSIS — H2513 Age-related nuclear cataract, bilateral: Secondary | ICD-10-CM | POA: Diagnosis not present

## 2014-09-26 ENCOUNTER — Other Ambulatory Visit: Payer: Self-pay | Admitting: Dermatology

## 2014-09-26 DIAGNOSIS — D692 Other nonthrombocytopenic purpura: Secondary | ICD-10-CM | POA: Diagnosis not present

## 2014-09-26 DIAGNOSIS — L570 Actinic keratosis: Secondary | ICD-10-CM | POA: Diagnosis not present

## 2014-09-26 DIAGNOSIS — D0439 Carcinoma in situ of skin of other parts of face: Secondary | ICD-10-CM | POA: Diagnosis not present

## 2014-09-26 DIAGNOSIS — D485 Neoplasm of uncertain behavior of skin: Secondary | ICD-10-CM | POA: Diagnosis not present

## 2014-09-26 DIAGNOSIS — Z85828 Personal history of other malignant neoplasm of skin: Secondary | ICD-10-CM | POA: Diagnosis not present

## 2014-09-26 DIAGNOSIS — L821 Other seborrheic keratosis: Secondary | ICD-10-CM | POA: Diagnosis not present

## 2014-10-08 ENCOUNTER — Other Ambulatory Visit: Payer: Self-pay | Admitting: Dermatology

## 2014-10-08 DIAGNOSIS — C44329 Squamous cell carcinoma of skin of other parts of face: Secondary | ICD-10-CM | POA: Diagnosis not present

## 2014-10-08 DIAGNOSIS — Z85828 Personal history of other malignant neoplasm of skin: Secondary | ICD-10-CM | POA: Diagnosis not present

## 2014-10-15 DIAGNOSIS — L570 Actinic keratosis: Secondary | ICD-10-CM | POA: Diagnosis not present

## 2014-10-22 DIAGNOSIS — L570 Actinic keratosis: Secondary | ICD-10-CM | POA: Diagnosis not present

## 2014-10-26 DIAGNOSIS — J329 Chronic sinusitis, unspecified: Secondary | ICD-10-CM | POA: Diagnosis not present

## 2014-10-29 DIAGNOSIS — L570 Actinic keratosis: Secondary | ICD-10-CM | POA: Diagnosis not present

## 2014-11-19 DIAGNOSIS — L570 Actinic keratosis: Secondary | ICD-10-CM | POA: Diagnosis not present

## 2014-11-26 DIAGNOSIS — L570 Actinic keratosis: Secondary | ICD-10-CM | POA: Diagnosis not present

## 2014-12-25 DIAGNOSIS — R5383 Other fatigue: Secondary | ICD-10-CM | POA: Diagnosis not present

## 2014-12-25 DIAGNOSIS — Z79899 Other long term (current) drug therapy: Secondary | ICD-10-CM | POA: Diagnosis not present

## 2014-12-25 DIAGNOSIS — E291 Testicular hypofunction: Secondary | ICD-10-CM | POA: Diagnosis not present

## 2014-12-25 DIAGNOSIS — E559 Vitamin D deficiency, unspecified: Secondary | ICD-10-CM | POA: Diagnosis not present

## 2014-12-25 DIAGNOSIS — M2550 Pain in unspecified joint: Secondary | ICD-10-CM | POA: Diagnosis not present

## 2014-12-25 DIAGNOSIS — R3915 Urgency of urination: Secondary | ICD-10-CM | POA: Diagnosis not present

## 2014-12-25 DIAGNOSIS — I10 Essential (primary) hypertension: Secondary | ICD-10-CM | POA: Diagnosis not present

## 2014-12-25 DIAGNOSIS — E119 Type 2 diabetes mellitus without complications: Secondary | ICD-10-CM | POA: Diagnosis not present

## 2015-01-24 DIAGNOSIS — L570 Actinic keratosis: Secondary | ICD-10-CM | POA: Diagnosis not present

## 2015-01-24 DIAGNOSIS — Z85828 Personal history of other malignant neoplasm of skin: Secondary | ICD-10-CM | POA: Diagnosis not present

## 2015-02-18 DIAGNOSIS — H18413 Arcus senilis, bilateral: Secondary | ICD-10-CM | POA: Diagnosis not present

## 2015-02-18 DIAGNOSIS — H25013 Cortical age-related cataract, bilateral: Secondary | ICD-10-CM | POA: Diagnosis not present

## 2015-02-18 DIAGNOSIS — H52223 Regular astigmatism, bilateral: Secondary | ICD-10-CM | POA: Diagnosis not present

## 2015-02-18 DIAGNOSIS — E119 Type 2 diabetes mellitus without complications: Secondary | ICD-10-CM | POA: Diagnosis not present

## 2015-02-18 DIAGNOSIS — H524 Presbyopia: Secondary | ICD-10-CM | POA: Diagnosis not present

## 2015-02-18 DIAGNOSIS — H2513 Age-related nuclear cataract, bilateral: Secondary | ICD-10-CM | POA: Diagnosis not present

## 2015-02-18 DIAGNOSIS — H5203 Hypermetropia, bilateral: Secondary | ICD-10-CM | POA: Diagnosis not present

## 2015-02-18 DIAGNOSIS — H11153 Pinguecula, bilateral: Secondary | ICD-10-CM | POA: Diagnosis not present

## 2015-03-07 DIAGNOSIS — H25813 Combined forms of age-related cataract, bilateral: Secondary | ICD-10-CM | POA: Diagnosis not present

## 2015-03-07 DIAGNOSIS — E119 Type 2 diabetes mellitus without complications: Secondary | ICD-10-CM | POA: Diagnosis not present

## 2015-03-25 DIAGNOSIS — H268 Other specified cataract: Secondary | ICD-10-CM | POA: Diagnosis not present

## 2015-03-25 DIAGNOSIS — H2511 Age-related nuclear cataract, right eye: Secondary | ICD-10-CM | POA: Diagnosis not present

## 2015-04-04 ENCOUNTER — Emergency Department (HOSPITAL_COMMUNITY): Payer: Medicare Other

## 2015-04-04 ENCOUNTER — Emergency Department (HOSPITAL_COMMUNITY)
Admission: EM | Admit: 2015-04-04 | Discharge: 2015-04-04 | Disposition: A | Discharge status: Home / Self Care | Payer: Medicare Other | Attending: Emergency Medicine | Admitting: Emergency Medicine

## 2015-04-04 ENCOUNTER — Encounter (HOSPITAL_COMMUNITY): Payer: Self-pay | Admitting: *Deleted

## 2015-04-04 DIAGNOSIS — I10 Essential (primary) hypertension: Secondary | ICD-10-CM | POA: Diagnosis not present

## 2015-04-04 DIAGNOSIS — J159 Unspecified bacterial pneumonia: Secondary | ICD-10-CM | POA: Diagnosis not present

## 2015-04-04 DIAGNOSIS — I252 Old myocardial infarction: Secondary | ICD-10-CM | POA: Diagnosis not present

## 2015-04-04 DIAGNOSIS — Z79899 Other long term (current) drug therapy: Secondary | ICD-10-CM | POA: Insufficient documentation

## 2015-04-04 DIAGNOSIS — F329 Major depressive disorder, single episode, unspecified: Secondary | ICD-10-CM | POA: Diagnosis not present

## 2015-04-04 DIAGNOSIS — I5032 Chronic diastolic (congestive) heart failure: Secondary | ICD-10-CM | POA: Diagnosis not present

## 2015-04-04 DIAGNOSIS — Z8673 Personal history of transient ischemic attack (TIA), and cerebral infarction without residual deficits: Secondary | ICD-10-CM | POA: Insufficient documentation

## 2015-04-04 DIAGNOSIS — F419 Anxiety disorder, unspecified: Secondary | ICD-10-CM | POA: Diagnosis not present

## 2015-04-04 DIAGNOSIS — J984 Other disorders of lung: Secondary | ICD-10-CM | POA: Diagnosis not present

## 2015-04-04 DIAGNOSIS — R0602 Shortness of breath: Secondary | ICD-10-CM | POA: Diagnosis not present

## 2015-04-04 DIAGNOSIS — Z85828 Personal history of other malignant neoplasm of skin: Secondary | ICD-10-CM | POA: Diagnosis not present

## 2015-04-04 DIAGNOSIS — E785 Hyperlipidemia, unspecified: Secondary | ICD-10-CM | POA: Insufficient documentation

## 2015-04-04 DIAGNOSIS — Z8719 Personal history of other diseases of the digestive system: Secondary | ICD-10-CM | POA: Diagnosis not present

## 2015-04-04 DIAGNOSIS — I2510 Atherosclerotic heart disease of native coronary artery without angina pectoris: Secondary | ICD-10-CM | POA: Diagnosis not present

## 2015-04-04 DIAGNOSIS — R509 Fever, unspecified: Secondary | ICD-10-CM | POA: Diagnosis not present

## 2015-04-04 DIAGNOSIS — J189 Pneumonia, unspecified organism: Secondary | ICD-10-CM

## 2015-04-04 DIAGNOSIS — E119 Type 2 diabetes mellitus without complications: Secondary | ICD-10-CM | POA: Diagnosis not present

## 2015-04-04 LAB — URINALYSIS, ROUTINE W REFLEX MICROSCOPIC
BILIRUBIN URINE: NEGATIVE
Glucose, UA: NEGATIVE mg/dL
Hgb urine dipstick: NEGATIVE
KETONES UR: NEGATIVE mg/dL
Leukocytes, UA: NEGATIVE
NITRITE: NEGATIVE
PROTEIN: NEGATIVE mg/dL
Specific Gravity, Urine: 1.009 (ref 1.005–1.030)
Urobilinogen, UA: 0.2 mg/dL (ref 0.0–1.0)
pH: 5 (ref 5.0–8.0)

## 2015-04-04 LAB — BASIC METABOLIC PANEL
Anion gap: 10 (ref 5–15)
BUN: 17 mg/dL (ref 6–20)
CHLORIDE: 101 mmol/L (ref 101–111)
CO2: 25 mmol/L (ref 22–32)
Calcium: 9.5 mg/dL (ref 8.9–10.3)
Creatinine, Ser: 1.6 mg/dL — ABNORMAL HIGH (ref 0.61–1.24)
GFR calc Af Amer: 49 mL/min — ABNORMAL LOW (ref >60)
GFR calc non Af Amer: 42 mL/min — ABNORMAL LOW (ref >60)
GLUCOSE: 164 mg/dL — AB (ref 65–99)
POTASSIUM: 4 mmol/L (ref 3.5–5.1)
SODIUM: 136 mmol/L (ref 135–145)

## 2015-04-04 LAB — CBC
HEMATOCRIT: 42.4 % (ref 39.0–52.0)
Hemoglobin: 14.4 g/dL (ref 13.0–17.0)
MCH: 31.6 pg (ref 26.0–34.0)
MCHC: 34 g/dL (ref 30.0–36.0)
MCV: 93.2 fL (ref 78.0–100.0)
Platelets: 171 10*3/uL (ref 150–400)
RBC: 4.55 MIL/uL (ref 4.22–5.81)
RDW: 13.3 % (ref 11.5–15.5)
WBC: 15.1 10*3/uL — ABNORMAL HIGH (ref 4.0–10.5)

## 2015-04-04 LAB — I-STAT TROPONIN, ED: TROPONIN I, POC: 0.01 ng/mL (ref 0.00–0.08)

## 2015-04-04 LAB — BRAIN NATRIURETIC PEPTIDE: B Natriuretic Peptide: 63.7 pg/mL (ref 0.0–100.0)

## 2015-04-04 MED ORDER — AMOXICILLIN-POT CLAVULANATE 875-125 MG PO TABS: 1.0000 | ORAL_TABLET | Freq: Two times a day (BID) | ORAL | Status: DC

## 2015-04-04 MED ORDER — AZITHROMYCIN 250 MG PO TABS: 250.0000 mg | ORAL_TABLET | Freq: Every day | ORAL | Status: DC

## 2015-04-04 MED ORDER — DOXYCYCLINE HYCLATE 100 MG PO CAPS: 100.0000 mg | ORAL_CAPSULE | Freq: Two times a day (BID) | ORAL | Status: DC

## 2015-04-04 MED ORDER — SODIUM CHLORIDE 0.9 % IV BOLUS (SEPSIS)
500.0000 mL | Freq: Once | INTRAVENOUS | Status: AC
  Administered 2015-04-04: 500 mL via INTRAVENOUS

## 2015-04-04 NOTE — ED Notes (Signed)
Pt verbalizes understanding of d/c instructions and denies any further needs at this time. 

## 2015-04-04 NOTE — ED Provider Notes (Signed)
Assumed care of pt from Dr. Rex Kras pending CT scan of chest.  This demonstrated CAP, with concern for aspiration.  Augmentin and azithro prescribed.  DC home in stable condition.  Community acquired pneumonia  Fever - Plan: CT Chest Wo Contrast, CT Chest Wo Contrast     Debby Freiberg, MD 04/04/15 1733

## 2015-04-04 NOTE — ED Notes (Signed)
Pt returned from CT °

## 2015-04-04 NOTE — ED Notes (Signed)
Patient transported to CT 

## 2015-04-04 NOTE — Discharge Instructions (Signed)
Fever, Adult A fever is a higher than normal body temperature. In an adult, an oral temperature around 98.6 F (37 C) is considered normal. A temperature of 100.4 F (38 C) or higher is generally considered a fever. Mild or moderate fevers generally have no long-term effects and often do not require treatment. Extreme fever (greater than or equal to 106 F or 41.1 C) can cause seizures. The sweating that may occur with repeated or prolonged fever may cause dehydration. Elderly people can develop confusion during a fever. A measured temperature can vary with:  Age.  Time of day.  Method of measurement (mouth, underarm, rectal, or ear). The fever is confirmed by taking a temperature with a thermometer. Temperatures can be taken different ways. Some methods are accurate and some are not.  An oral temperature is used most commonly. Electronic thermometers are fast and accurate.  An ear temperature will only be accurate if the thermometer is positioned as recommended by the manufacturer.  A rectal temperature is accurate and done for those adults who have a condition where an oral temperature cannot be taken.  An underarm (axillary) temperature is not accurate and not recommended. Fever is a symptom, not a disease.  CAUSES   Infections commonly cause fever.  Some noninfectious causes for fever include:  Some arthritis conditions.  Some thyroid or adrenal gland conditions.  Some immune system conditions.  Some types of cancer.  A medicine reaction.  High doses of certain street drugs such as methamphetamine.  Dehydration.  Exposure to high outside or room temperatures.  Occasionally, the source of a fever cannot be determined. This is sometimes called a "fever of unknown origin" (FUO).  Some situations may lead to a temporary rise in body temperature that may go away on its own. Examples are:  Childbirth.  Surgery.  Intense exercise. HOME CARE INSTRUCTIONS   Take  appropriate medicines for fever. Follow dosing instructions carefully. If you use acetaminophen to reduce the fever, be careful to avoid taking other medicines that also contain acetaminophen. Do not take aspirin for a fever if you are younger than age 67. There is an association with Reye's syndrome. Reye's syndrome is a rare but potentially deadly disease.  If an infection is present and antibiotics have been prescribed, take them as directed. Finish them even if you start to feel better.  Rest as needed.  Maintain an adequate fluid intake. To prevent dehydration during an illness with prolonged or recurrent fever, you may need to drink extra fluid.Drink enough fluids to keep your urine clear or pale yellow.  Sponging or bathing with room temperature water may help reduce body temperature. Do not use ice water or alcohol sponge baths.  Dress comfortably, but do not over-bundle. SEEK MEDICAL CARE IF:   You are unable to keep fluids down.  You develop vomiting or diarrhea.  You are not feeling at least partly better after 3 days.  You develop new symptoms or problems. SEEK IMMEDIATE MEDICAL CARE IF:   You have shortness of breath or trouble breathing.  You develop excessive weakness.  You are dizzy or you faint.  You are extremely thirsty or you are making little or no urine.  You develop new pain that was not there before (such as in the head, neck, chest, back, or abdomen).  You have persistent vomiting and diarrhea for more than 1 to 2 days.  You develop a stiff neck or your eyes become sensitive to light.  You develop a  skin rash.  You have a fever or persistent symptoms for more than 2 to 3 days.  You have a fever and your symptoms suddenly get worse. MAKE SURE YOU:   Understand these instructions.  Will watch your condition.  Will get help right away if you are not doing well or get worse. Document Released: 02/03/2001 Document Revised: 12/25/2013 Document  Reviewed: 06/11/2011 [facility] Patient Information 2015 [place], [place]. This information is not intended to replace advice given to you by your health care provider. Make sure you discuss any questions you have with your health care provider.   Please obtain all of your results from medical records or have your doctors office obtain the results - share them with your doctor - you should be seen at your doctors office in the next 2 days. Call today to arrange your follow up. Take the medications as prescribed. Please review all of the medicines and only take them if you do not have an allergy to them. Please be aware that if you are taking birth control pills, taking other prescriptions, ESPECIALLY ANTIBIOTICS may make the birth control ineffective - if this is the case, either do not engage in sexual activity or use alternative methods of birth control such as condoms until you have finished the medicine and your family doctor says it is OK to restart them. If you are on a blood thinner such as COUMADIN, be aware that any other medicine that you take may cause the coumadin to either work too much, or not enough - you should have your coumadin level rechecked in next 7 days if this is the case.  ?  It is also a possibility that you have an allergic reaction to any of the medicines that you have been prescribed - Everybody reacts differently to medications and while MOST people have no trouble with most medicines, you may have a reaction such as nausea, vomiting, rash, swelling, shortness of breath. If this is the case, please stop taking the medicine immediately and contact your physician.  ?  You should return to the ER if you develop severe or worsening symptoms.     **IT IS VERY IMPORTANT FOR YOU TO FOLLOW UP WITH YOUR PRIMARY CARE PROVIDER IN A FEW DAYS FOR RECHECK OF CREATININE**Pneumonia Pneumonia is an infection of the lungs.  CAUSES Pneumonia may be caused by bacteria or a virus. Usually, these  infections are caused by breathing infectious particles into the lungs (respiratory tract). SIGNS AND SYMPTOMS   Cough.  Fever.  Chest pain.  Increased rate of breathing.  Wheezing.  Mucus production. DIAGNOSIS  If you have the common symptoms of pneumonia, your health care provider will typically confirm the diagnosis with a chest X-ray. The X-ray will show an abnormality in the lung (pulmonary infiltrate) if you have pneumonia. Other tests of your blood, urine, or sputum may be done to find the specific cause of your pneumonia. Your health care provider may also do tests (blood gases or pulse oximetry) to see how well your lungs are working. TREATMENT  Some forms of pneumonia may be spread to other people when you cough or sneeze. You may be asked to wear a mask before and during your exam. Pneumonia that is caused by bacteria is treated with antibiotic medicine. Pneumonia that is caused by the influenza virus may be treated with an antiviral medicine. Most other viral infections must run their course. These infections will not respond to antibiotics.  HOME CARE INSTRUCTIONS  Cough suppressants may be used if you are losing too much rest. However, coughing protects you by clearing your lungs. You should avoid using cough suppressants if you can.  Your health care provider may have prescribed medicine if he or she thinks your pneumonia is caused by bacteria or influenza. Finish your medicine even if you start to feel better.  Your health care provider may also prescribe an expectorant. This loosens the mucus to be coughed up.  Take medicines only as directed by your health care provider.  Do not smoke. Smoking is a common cause of bronchitis and can contribute to pneumonia. If you are a smoker and continue to smoke, your cough may last several weeks after your pneumonia has cleared.  A cold steam vaporizer or humidifier in your room or home may help loosen mucus.  Coughing is often  worse at night. Sleeping in a semi-upright position in a recliner or using a couple pillows under your head will help with this.  Get rest as you feel it is needed. Your body will usually let you know when you need to rest. PREVENTION A pneumococcal shot (vaccine) is available to prevent a common bacterial cause of pneumonia. This is usually suggested for:  People over 69 years old.  Patients on chemotherapy.  People with chronic lung problems, such as bronchitis or emphysema.  People with immune system problems. If you are over 65 or have a high risk condition, you may receive the pneumococcal vaccine if you have not received it before. In some countries, a routine influenza vaccine is also recommended. This vaccine can help prevent some cases of pneumonia.You may be offered the influenza vaccine as part of your care. If you smoke, it is time to quit. You may receive instructions on how to stop smoking. Your health care provider can provide medicines and counseling to help you quit. SEEK MEDICAL CARE IF: You have a fever. SEEK IMMEDIATE MEDICAL CARE IF:   Your illness becomes worse. This is especially true if you are elderly or weakened from any other disease.  You cannot control your cough with suppressants and are losing sleep.  You begin coughing up blood.  You develop pain which is getting worse or is uncontrolled with medicines.  Any of the symptoms which initially brought you in for treatment are getting worse rather than better.  You develop shortness of breath or chest pain. MAKE SURE YOU:   Understand these instructions.  Will watch your condition.  Will get help right away if you are not doing well or get worse. Document Released: 08/10/2005 Document Revised: 12/25/2013 Document Reviewed: 10/30/2010 [facility] Patient Information 2015 [place], [place]. This information is not intended to replace advice given to you by your health care provider. Make sure you discuss any  questions you have with your health care provider.

## 2015-04-04 NOTE — ED Provider Notes (Signed)
CSN: [id number]     Arrival date & time 04/04/15  1305 History   First MD Initiated Contact with Patient 04/04/15 1431     Chief Complaint  Patient presents with  . Shortness of Breath  . Fever     (Consider location/radiation/quality/duration/timing/severity/associated sxs/prior Treatment) HPI Comments: 69 year old male with extensive past medical history including diastolic CHF, CAD status post MI, TIA, type 2 diabetes mellitus, hypertension, sleep apnea, GERD who presents with fever and malaise. The patient states that just prior to arrival, he was getting ready to know the grass when he had a sudden onset of generalized fatigue and malaise. He started developing chills and had an episode of blurry vision so he sat down on his couch. He covered up and a lot of blankets and later noted to have a fever of 10 wine. He became very thirsty and was shivering and drink water, after which his wife gave him ibuprofen. He states that he has chronic shortness of breath which has not changed recently. His wife does note that last night he seemed more short of breath than usual and was pale and not feeling well before bed. He has had a 7-10 pound weight gain over the past few weeks and has been taking increased Lasix at home to try to help. He denies any episodes of chest pain. No urinary symptoms. No abdominal pain, vomiting, diarrhea, rashes, headache, neck stiffness, or sick contacts. No cough.  Patient is a 68 y.o. male presenting with shortness of breath and fever. The history is provided by the patient.  Shortness of Breath Associated symptoms: fever   Fever   Past Medical History  Diagnosis Date  . Diastolic CHF, chronic     a. EF 55-60% by echo 2007  . Hypertension   . Coronary artery disease     a. s/p MI & CABG; b. s/p PCI Diag;  c. Cath 12/2011: LAD diff dzs/small, Diag 75% isr, 90 dist to stent (small), LCX & RCA occluded, VG->OM 40, VG->PDA patent - med rx.  . History of transient  ischemic attack (TIA)     CAROTID DOPPLERS NOV 2011  0-39& BIL. STENOSIS  . Hyperlipidemia   . Allergic rhinitis   . History of diverticulitis of colon     5 YRS AGO  . Diabetes mellitus     ORAL MEDS  . 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 Anxiety   . Depression   . Neuromuscular disorder     left arm numbness   . GERD (gastroesophageal reflux disease)   . Cancer     hx of skin cancers   . Sleep apnea     cpap- see ov [facility] in [facility] 05/12/11 for settings   . Myocardial infarction    Past Surgical History  Procedure Laterality Date  . Right knee ed compartment replacement  2004  . Laparoscopic gastric banding  01-15-09  . Knee arthroscopy w/ meniscectomy  X2 IN 2002-- RIGHT KNEE  . Umbilical hernia repair  01-15-09    W/ GASTRIC BANDING PROCEDURE  . Coronary artery bypass graft  1995    X3 VESSEL  . Blepharoplasty  1985    BILATERAL  . Circumcision  35 YRS  AGO  . Knee arthroscopy  07/22/2011    Procedure: ARTHROSCOPY KNEE;  Surgeon: [name];  Location: [place];  Service: Orthopedics;  Laterality: Right;  WITH DEBRIDEMENT   .  Chondroplasty  07/22/2011    Procedure: CHONDROPLASTY;  Surgeon: [name];  Location: [place];  Service: Orthopedics;;  . Synovectomy  07/22/2011    Procedure: SYNOVECTOMY;  Surgeon: Dione Plover Aluisio;  Location: [place];  Service: Orthopedics;;  . Coronary angioplasty  1996-- POST CABG    W/ STENT, last cath 01/07/2012   . Coronary stent placement    . Cardiac catheterization  2001, 2004, 2009, 2011  . Left heart catheterization with coronary angiogram N/A 09/11/2013    Procedure: LEFT HEART CATHETERIZATION WITH CORONARY ANGIOGRAM;  Surgeon: [name], MD;  Location: [facility] CATH LAB;  Service: Cardiovascular;  Laterality: N/A;   Family History  Problem Relation Age of Onset  . Heart disease Sister     CABG  . Stroke Sister   .  Other Mother     joint problems  . Hypertension Sister   . Congestive Heart Failure Sister   . Diabetes Sister   . Heart disease Brother   . Hypertension Brother   . Congestive Heart Failure Brother   . Hypertension Father   . Heart disease Father   . CVA Father   . Depression Father    Social History  Substance Use Topics  . Smoking status: Never Smoker   . Smokeless tobacco: Never Used  . Alcohol Use: Yes     Comment: OCCASIONAL    Review of Systems  Constitutional: Positive for fever.  Respiratory: Positive for shortness of breath.     10 Systems reviewed and are negative for acute change except as noted in the HPI.   Allergies  Codeine  Home Medications   Prior to Admission medications   Medication Sig Start Date End Date Taking? Authorizing Provider  amoxicillin (AMOXIL) 500 MG capsule Take 500 mg by mouth 2 (two) times daily.    Historical Provider, MD  azithromycin (ZITHROMAX) 250 MG tablet  04/24/14   Historical Provider, MD  carvedilol (COREG) 6.25 MG tablet TAKE ONE TABLET BY MOUTH TWICE DAILY 08/03/14   [name], MD  fluticasone [facility]) 50 MCG/ACT nasal spray Place 1 spray into both nostrils daily as needed for allergies or rhinitis.    Historical Provider, MD  furosemide (LASIX) 40 MG tablet Take 60 mg by mouth daily. Pt takes 60mg  qAM(1.5 tab daily) 01/29/14   Imogene Burn, PA-C  isosorbide mononitrate (IMDUR) 30 MG 24 hr tablet Take 15 mg by mouth daily. Pt takes 0.5 tab daily 01/29/14   Imogene Burn, PA-C  metFORMIN (GLUCOPHAGE) 500 MG tablet Take 500 mg by mouth 2 (two) times daily with a meal.    Historical Provider, MD  mirtazapine (REMERON) 15 MG tablet Take 15 mg by mouth at bedtime.    Historical Provider, MD  nitroGLYCERIN (NITROSTAT) 0.4 MG SL tablet Place 1 tablet (0.4 mg total) under the tongue every 5 (five) minutes as needed for chest pain. 09/04/13 05/09/15  Liliane Shi, PA-C  potassium chloride SA (K-DUR,KLOR-CON) 20 MEQ tablet Take  by mouth daily. 1 and 1/2 tablets (30 mEq) daily 09/27/13   [name], MD  rosuvastatin (CRESTOR) 10 MG tablet Take 10 mg by mouth daily.    Historical Provider, MD  tadalafil (CIALIS) 20 MG tablet Take 20 mg by mouth daily as needed for erectile dysfunction.    Historical Provider, MD   BP 112/65 mmHg  Pulse 70  Temp(Src) 98.8 F (37.1 C) (Oral)  Resp 16  Ht 5\' 10"  (1.778  m)  Wt 226 lb 1.6 oz (102.558 kg)  BMI 32.44 kg/m2  SpO2 96% Physical Exam  Constitutional: He is oriented to person, place, and time. He appears well-developed and well-nourished. No distress.  HENT:  Head: Normocephalic and atraumatic.  Moist mucous membranes  Eyes: Conjunctivae are normal. Pupils are equal, round, and reactive to light.  Neck: Neck supple.  Cardiovascular: Normal rate, regular rhythm and normal heart sounds.   No murmur heard. Pulmonary/Chest: Effort normal and breath sounds normal. No respiratory distress.  Abdominal: Soft. Bowel sounds are normal. He exhibits no distension. There is no tenderness.  Port for lap band in R upper abdomen  Musculoskeletal: He exhibits no edema.  Neurological: He is alert and oriented to person, place, and time.  Fluent speech  Skin: Skin is warm and dry.  No skin breakdown on feet  Psychiatric: He has a normal mood and affect. Judgment normal.  Nursing note and vitals reviewed.   ED Course  Procedures (including critical care time) Labs Review Labs Reviewed  BASIC METABOLIC PANEL - Abnormal; Notable for the following:    Glucose, Bld 164 (*)    Creatinine, Ser 1.60 (*)    GFR calc non Af Amer 42 (*)    GFR calc Af Amer 49 (*)    All other components within normal limits  CBC - Abnormal; Notable for the following:    WBC 15.1 (*)    All other components within normal limits  URINE CULTURE  BRAIN NATRIURETIC PEPTIDE  URINALYSIS, ROUTINE W REFLEX MICROSCOPIC (NOT AT [facility])  Randolm Idol, ED    Imaging Review Dg Chest 2 View  04/04/2015    CLINICAL DATA:  Shortness of breath.  EXAM: CHEST  2 VIEW  COMPARISON:  August 19, 2012.  FINDINGS: Stable cardiomediastinal silhouette. Both lungs are clear. No pneumothorax or pleural effusion is noted. Status post coronary artery bypass graft. The visualized skeletal structures are unremarkable.  IMPRESSION: No active cardiopulmonary disease.   Electronically Signed   By: Marijo Conception, M.D.   On: 04/04/2015 14:51     EKG Interpretation   Date/Time:  Thursday April 04 2015 13:11:50 EDT Ventricular Rate:  78 PR Interval:  148 QRS Duration: 114 QT Interval:  366 QTC Calculation: 417 R Axis:   -15 Text Interpretation:  Sinus rhythm with Premature atrial complexes  Incomplete left bundle branch block Borderline ECG No significant change  since last tracing Confirmed by Lorice Lafave MD, Flynt Breeze [id number]) on 04/04/2015  2:38:23 PM      MDM   Final diagnoses:  None   fever AKI Chronic SOB    69 year old male with extensive cardiac history who presents with an episode of fever that occurred this afternoon and was associated with generalized malaise and fatigue. At presentation, the patient was awake, alert, and in no acute distress. Vital signs unremarkable. EKG on arrival is unchanged from previous. Patient denies any complaints of chest pain. Obtained lab work listed above which was notable for elevated creatinine at 1.6, WBC 15.1. BNP normal. Gave the patient a small IV fluid bolus for his AK I.  CXR without acute process. The patient denies any escalation of his chronic shortness of breath but his wife does note that he seemed more short of breath last night. Given the patient's elevated white cell count, I have ordered a CT of the chest to evaluate for occult pneumonia. The patient has normal vital signs with no evidence of tachycardia or hypoxia and given his fever,  I feel that PE is an unlikely explanation of his symptoms. He denies any rash but does endorse significant time outdoors. If  his CT scan is negative, and the patient remains well appearing with normal vital signs, I anticipate that he will be able to go home with doxycycline for empiric treatment of tickborne illness. I have informed him and his wife of his elevated creatinine which may be due to the fact that the patient has increased his Lasix at home. They have voiced understanding. I am signing the patient out to the oncoming attending physician, who will follow up with the CT scan and discuss results with patient.  Sharlett Iles, MD 04/06/15 [phone]

## 2015-04-04 NOTE — ED Notes (Signed)
Pt reports feeling ok this am, went to mow grass and sudden onset of fatigue, sob and family reported a fever. Hx of chf and reports recent weight gain. ekg done at triage.

## 2015-04-06 LAB — URINE CULTURE
CULTURE: NO GROWTH
Special Requests: NORMAL

## 2015-04-08 DIAGNOSIS — J189 Pneumonia, unspecified organism: Secondary | ICD-10-CM | POA: Diagnosis not present

## 2015-04-08 DIAGNOSIS — N289 Disorder of kidney and ureter, unspecified: Secondary | ICD-10-CM | POA: Diagnosis not present

## 2015-04-09 DIAGNOSIS — H2512 Age-related nuclear cataract, left eye: Secondary | ICD-10-CM | POA: Diagnosis not present

## 2015-04-11 ENCOUNTER — Other Ambulatory Visit: Payer: Self-pay | Admitting: Family Medicine

## 2015-04-11 DIAGNOSIS — J189 Pneumonia, unspecified organism: Secondary | ICD-10-CM

## 2015-04-11 DIAGNOSIS — N289 Disorder of kidney and ureter, unspecified: Secondary | ICD-10-CM

## 2015-04-15 ENCOUNTER — Other Ambulatory Visit: Payer: No Typology Code available for payment source

## 2015-04-15 DIAGNOSIS — S93492A Sprain of other ligament of left ankle, initial encounter: Secondary | ICD-10-CM | POA: Diagnosis not present

## 2015-04-15 DIAGNOSIS — H25812 Combined forms of age-related cataract, left eye: Secondary | ICD-10-CM | POA: Diagnosis not present

## 2015-04-15 DIAGNOSIS — H2512 Age-related nuclear cataract, left eye: Secondary | ICD-10-CM | POA: Diagnosis not present

## 2015-04-19 ENCOUNTER — Ambulatory Visit
Admission: RE | Admit: 2015-04-19 | Discharge: 2015-04-19 | Disposition: A | Discharge status: Home / Self Care | Payer: Medicare Other | Source: Ambulatory Visit | Attending: Family Medicine | Admitting: Family Medicine

## 2015-04-19 DIAGNOSIS — N289 Disorder of kidney and ureter, unspecified: Secondary | ICD-10-CM

## 2015-04-24 ENCOUNTER — Other Ambulatory Visit: Payer: Self-pay | Admitting: Family Medicine

## 2015-04-24 DIAGNOSIS — N2889 Other specified disorders of kidney and ureter: Secondary | ICD-10-CM

## 2015-04-30 DIAGNOSIS — S93402A Sprain of unspecified ligament of left ankle, initial encounter: Secondary | ICD-10-CM | POA: Diagnosis not present

## 2015-05-06 ENCOUNTER — Other Ambulatory Visit: Payer: Self-pay | Admitting: Family Medicine

## 2015-05-06 ENCOUNTER — Ambulatory Visit
Admission: RE | Admit: 2015-05-06 | Discharge: 2015-05-06 | Disposition: A | Discharge status: Home / Self Care | Payer: Medicare Other | Source: Ambulatory Visit | Attending: Family Medicine | Admitting: Family Medicine

## 2015-05-06 DIAGNOSIS — N2889 Other specified disorders of kidney and ureter: Secondary | ICD-10-CM

## 2015-05-06 DIAGNOSIS — J189 Pneumonia, unspecified organism: Secondary | ICD-10-CM

## 2015-05-06 DIAGNOSIS — N289 Disorder of kidney and ureter, unspecified: Secondary | ICD-10-CM | POA: Diagnosis not present

## 2015-05-06 MED ORDER — GADOBENATE DIMEGLUMINE 529 MG/ML IV SOLN
10.0000 mL | Freq: Once | INTRAVENOUS | Status: AC | PRN
  Administered 2015-05-06: 10 mL via INTRAVENOUS

## 2015-05-09 ENCOUNTER — Other Ambulatory Visit: Payer: No Typology Code available for payment source

## 2015-05-16 DIAGNOSIS — M5415 Radiculopathy, thoracolumbar region: Secondary | ICD-10-CM | POA: Diagnosis not present

## 2015-05-16 DIAGNOSIS — M5137 Other intervertebral disc degeneration, lumbosacral region: Secondary | ICD-10-CM | POA: Diagnosis not present

## 2015-05-16 DIAGNOSIS — M9902 Segmental and somatic dysfunction of thoracic region: Secondary | ICD-10-CM | POA: Diagnosis not present

## 2015-05-16 DIAGNOSIS — M9904 Segmental and somatic dysfunction of sacral region: Secondary | ICD-10-CM | POA: Diagnosis not present

## 2015-05-16 DIAGNOSIS — M9903 Segmental and somatic dysfunction of lumbar region: Secondary | ICD-10-CM | POA: Diagnosis not present

## 2015-05-16 DIAGNOSIS — M5408 Panniculitis affecting regions of neck and back, sacral and sacrococcygeal region: Secondary | ICD-10-CM | POA: Diagnosis not present

## 2015-05-18 ENCOUNTER — Other Ambulatory Visit: Payer: Self-pay | Admitting: Cardiology

## 2015-05-20 DIAGNOSIS — M5137 Other intervertebral disc degeneration, lumbosacral region: Secondary | ICD-10-CM | POA: Diagnosis not present

## 2015-05-20 DIAGNOSIS — M5408 Panniculitis affecting regions of neck and back, sacral and sacrococcygeal region: Secondary | ICD-10-CM | POA: Diagnosis not present

## 2015-05-20 DIAGNOSIS — M9903 Segmental and somatic dysfunction of lumbar region: Secondary | ICD-10-CM | POA: Diagnosis not present

## 2015-05-20 DIAGNOSIS — M5415 Radiculopathy, thoracolumbar region: Secondary | ICD-10-CM | POA: Diagnosis not present

## 2015-05-20 DIAGNOSIS — M9904 Segmental and somatic dysfunction of sacral region: Secondary | ICD-10-CM | POA: Diagnosis not present

## 2015-05-20 DIAGNOSIS — M9902 Segmental and somatic dysfunction of thoracic region: Secondary | ICD-10-CM | POA: Diagnosis not present

## 2015-05-22 DIAGNOSIS — M5137 Other intervertebral disc degeneration, lumbosacral region: Secondary | ICD-10-CM | POA: Diagnosis not present

## 2015-05-22 DIAGNOSIS — M9904 Segmental and somatic dysfunction of sacral region: Secondary | ICD-10-CM | POA: Diagnosis not present

## 2015-05-22 DIAGNOSIS — M5408 Panniculitis affecting regions of neck and back, sacral and sacrococcygeal region: Secondary | ICD-10-CM | POA: Diagnosis not present

## 2015-05-22 DIAGNOSIS — M9902 Segmental and somatic dysfunction of thoracic region: Secondary | ICD-10-CM | POA: Diagnosis not present

## 2015-05-22 DIAGNOSIS — M9903 Segmental and somatic dysfunction of lumbar region: Secondary | ICD-10-CM | POA: Diagnosis not present

## 2015-05-22 DIAGNOSIS — M5415 Radiculopathy, thoracolumbar region: Secondary | ICD-10-CM | POA: Diagnosis not present

## 2015-05-24 DIAGNOSIS — M5137 Other intervertebral disc degeneration, lumbosacral region: Secondary | ICD-10-CM | POA: Diagnosis not present

## 2015-05-24 DIAGNOSIS — M9903 Segmental and somatic dysfunction of lumbar region: Secondary | ICD-10-CM | POA: Diagnosis not present

## 2015-05-24 DIAGNOSIS — M9902 Segmental and somatic dysfunction of thoracic region: Secondary | ICD-10-CM | POA: Diagnosis not present

## 2015-05-24 DIAGNOSIS — M5415 Radiculopathy, thoracolumbar region: Secondary | ICD-10-CM | POA: Diagnosis not present

## 2015-05-24 DIAGNOSIS — M5408 Panniculitis affecting regions of neck and back, sacral and sacrococcygeal region: Secondary | ICD-10-CM | POA: Diagnosis not present

## 2015-05-24 DIAGNOSIS — M9904 Segmental and somatic dysfunction of sacral region: Secondary | ICD-10-CM | POA: Diagnosis not present

## 2015-05-27 DIAGNOSIS — M9902 Segmental and somatic dysfunction of thoracic region: Secondary | ICD-10-CM | POA: Diagnosis not present

## 2015-05-27 DIAGNOSIS — M9903 Segmental and somatic dysfunction of lumbar region: Secondary | ICD-10-CM | POA: Diagnosis not present

## 2015-05-27 DIAGNOSIS — M5408 Panniculitis affecting regions of neck and back, sacral and sacrococcygeal region: Secondary | ICD-10-CM | POA: Diagnosis not present

## 2015-05-27 DIAGNOSIS — M5415 Radiculopathy, thoracolumbar region: Secondary | ICD-10-CM | POA: Diagnosis not present

## 2015-05-27 DIAGNOSIS — M5137 Other intervertebral disc degeneration, lumbosacral region: Secondary | ICD-10-CM | POA: Diagnosis not present

## 2015-05-27 DIAGNOSIS — M9904 Segmental and somatic dysfunction of sacral region: Secondary | ICD-10-CM | POA: Diagnosis not present

## 2015-05-29 DIAGNOSIS — M5408 Panniculitis affecting regions of neck and back, sacral and sacrococcygeal region: Secondary | ICD-10-CM | POA: Diagnosis not present

## 2015-05-29 DIAGNOSIS — M5415 Radiculopathy, thoracolumbar region: Secondary | ICD-10-CM | POA: Diagnosis not present

## 2015-05-29 DIAGNOSIS — M9902 Segmental and somatic dysfunction of thoracic region: Secondary | ICD-10-CM | POA: Diagnosis not present

## 2015-05-29 DIAGNOSIS — M9904 Segmental and somatic dysfunction of sacral region: Secondary | ICD-10-CM | POA: Diagnosis not present

## 2015-05-29 DIAGNOSIS — M5137 Other intervertebral disc degeneration, lumbosacral region: Secondary | ICD-10-CM | POA: Diagnosis not present

## 2015-05-29 DIAGNOSIS — M9903 Segmental and somatic dysfunction of lumbar region: Secondary | ICD-10-CM | POA: Diagnosis not present

## 2015-06-06 DIAGNOSIS — M5415 Radiculopathy, thoracolumbar region: Secondary | ICD-10-CM | POA: Diagnosis not present

## 2015-06-06 DIAGNOSIS — M9902 Segmental and somatic dysfunction of thoracic region: Secondary | ICD-10-CM | POA: Diagnosis not present

## 2015-06-06 DIAGNOSIS — M9903 Segmental and somatic dysfunction of lumbar region: Secondary | ICD-10-CM | POA: Diagnosis not present

## 2015-06-06 DIAGNOSIS — M9904 Segmental and somatic dysfunction of sacral region: Secondary | ICD-10-CM | POA: Diagnosis not present

## 2015-06-06 DIAGNOSIS — M5408 Panniculitis affecting regions of neck and back, sacral and sacrococcygeal region: Secondary | ICD-10-CM | POA: Diagnosis not present

## 2015-06-06 DIAGNOSIS — M5137 Other intervertebral disc degeneration, lumbosacral region: Secondary | ICD-10-CM | POA: Diagnosis not present

## 2015-06-11 DIAGNOSIS — M25562 Pain in left knee: Secondary | ICD-10-CM | POA: Diagnosis not present

## 2015-06-11 DIAGNOSIS — M2392 Unspecified internal derangement of left knee: Secondary | ICD-10-CM | POA: Diagnosis not present

## 2015-06-11 DIAGNOSIS — S83232D Complex tear of medial meniscus, current injury, left knee, subsequent encounter: Secondary | ICD-10-CM | POA: Diagnosis not present

## 2015-06-12 DIAGNOSIS — M9904 Segmental and somatic dysfunction of sacral region: Secondary | ICD-10-CM | POA: Diagnosis not present

## 2015-06-12 DIAGNOSIS — M9902 Segmental and somatic dysfunction of thoracic region: Secondary | ICD-10-CM | POA: Diagnosis not present

## 2015-06-12 DIAGNOSIS — M9903 Segmental and somatic dysfunction of lumbar region: Secondary | ICD-10-CM | POA: Diagnosis not present

## 2015-06-12 DIAGNOSIS — M5408 Panniculitis affecting regions of neck and back, sacral and sacrococcygeal region: Secondary | ICD-10-CM | POA: Diagnosis not present

## 2015-06-12 DIAGNOSIS — M5137 Other intervertebral disc degeneration, lumbosacral region: Secondary | ICD-10-CM | POA: Diagnosis not present

## 2015-06-12 DIAGNOSIS — M5415 Radiculopathy, thoracolumbar region: Secondary | ICD-10-CM | POA: Diagnosis not present

## 2015-06-19 DIAGNOSIS — R05 Cough: Secondary | ICD-10-CM | POA: Diagnosis not present

## 2015-06-26 DIAGNOSIS — M5415 Radiculopathy, thoracolumbar region: Secondary | ICD-10-CM | POA: Diagnosis not present

## 2015-06-26 DIAGNOSIS — M9903 Segmental and somatic dysfunction of lumbar region: Secondary | ICD-10-CM | POA: Diagnosis not present

## 2015-06-26 DIAGNOSIS — M5137 Other intervertebral disc degeneration, lumbosacral region: Secondary | ICD-10-CM | POA: Diagnosis not present

## 2015-06-26 DIAGNOSIS — M9902 Segmental and somatic dysfunction of thoracic region: Secondary | ICD-10-CM | POA: Diagnosis not present

## 2015-06-26 DIAGNOSIS — M5408 Panniculitis affecting regions of neck and back, sacral and sacrococcygeal region: Secondary | ICD-10-CM | POA: Diagnosis not present

## 2015-06-26 DIAGNOSIS — M9904 Segmental and somatic dysfunction of sacral region: Secondary | ICD-10-CM | POA: Diagnosis not present

## 2015-07-01 DIAGNOSIS — E291 Testicular hypofunction: Secondary | ICD-10-CM | POA: Diagnosis not present

## 2015-07-01 DIAGNOSIS — E119 Type 2 diabetes mellitus without complications: Secondary | ICD-10-CM | POA: Diagnosis not present

## 2015-07-01 DIAGNOSIS — Z79899 Other long term (current) drug therapy: Secondary | ICD-10-CM | POA: Diagnosis not present

## 2015-07-01 DIAGNOSIS — Z23 Encounter for immunization: Secondary | ICD-10-CM | POA: Diagnosis not present

## 2015-07-01 DIAGNOSIS — R5383 Other fatigue: Secondary | ICD-10-CM | POA: Diagnosis not present

## 2015-07-01 DIAGNOSIS — N5201 Erectile dysfunction due to arterial insufficiency: Secondary | ICD-10-CM | POA: Diagnosis not present

## 2015-07-05 DIAGNOSIS — M9902 Segmental and somatic dysfunction of thoracic region: Secondary | ICD-10-CM | POA: Diagnosis not present

## 2015-07-05 DIAGNOSIS — M5415 Radiculopathy, thoracolumbar region: Secondary | ICD-10-CM | POA: Diagnosis not present

## 2015-07-05 DIAGNOSIS — M5137 Other intervertebral disc degeneration, lumbosacral region: Secondary | ICD-10-CM | POA: Diagnosis not present

## 2015-07-05 DIAGNOSIS — M5408 Panniculitis affecting regions of neck and back, sacral and sacrococcygeal region: Secondary | ICD-10-CM | POA: Diagnosis not present

## 2015-07-05 DIAGNOSIS — M9903 Segmental and somatic dysfunction of lumbar region: Secondary | ICD-10-CM | POA: Diagnosis not present

## 2015-07-05 DIAGNOSIS — M9904 Segmental and somatic dysfunction of sacral region: Secondary | ICD-10-CM | POA: Diagnosis not present

## 2015-07-10 DIAGNOSIS — M9902 Segmental and somatic dysfunction of thoracic region: Secondary | ICD-10-CM | POA: Diagnosis not present

## 2015-07-10 DIAGNOSIS — M5137 Other intervertebral disc degeneration, lumbosacral region: Secondary | ICD-10-CM | POA: Diagnosis not present

## 2015-07-10 DIAGNOSIS — M9903 Segmental and somatic dysfunction of lumbar region: Secondary | ICD-10-CM | POA: Diagnosis not present

## 2015-07-10 DIAGNOSIS — M5415 Radiculopathy, thoracolumbar region: Secondary | ICD-10-CM | POA: Diagnosis not present

## 2015-07-10 DIAGNOSIS — M5408 Panniculitis affecting regions of neck and back, sacral and sacrococcygeal region: Secondary | ICD-10-CM | POA: Diagnosis not present

## 2015-07-10 DIAGNOSIS — M9904 Segmental and somatic dysfunction of sacral region: Secondary | ICD-10-CM | POA: Diagnosis not present

## 2015-07-24 DIAGNOSIS — M9902 Segmental and somatic dysfunction of thoracic region: Secondary | ICD-10-CM | POA: Diagnosis not present

## 2015-07-24 DIAGNOSIS — M9904 Segmental and somatic dysfunction of sacral region: Secondary | ICD-10-CM | POA: Diagnosis not present

## 2015-07-24 DIAGNOSIS — M5408 Panniculitis affecting regions of neck and back, sacral and sacrococcygeal region: Secondary | ICD-10-CM | POA: Diagnosis not present

## 2015-07-24 DIAGNOSIS — M9903 Segmental and somatic dysfunction of lumbar region: Secondary | ICD-10-CM | POA: Diagnosis not present

## 2015-07-24 DIAGNOSIS — M5137 Other intervertebral disc degeneration, lumbosacral region: Secondary | ICD-10-CM | POA: Diagnosis not present

## 2015-07-24 DIAGNOSIS — M5415 Radiculopathy, thoracolumbar region: Secondary | ICD-10-CM | POA: Diagnosis not present

## 2015-07-26 DIAGNOSIS — S83232D Complex tear of medial meniscus, current injury, left knee, subsequent encounter: Secondary | ICD-10-CM | POA: Diagnosis not present

## 2015-07-26 DIAGNOSIS — M25662 Stiffness of left knee, not elsewhere classified: Secondary | ICD-10-CM | POA: Diagnosis not present

## 2015-08-08 ENCOUNTER — Encounter: Payer: Self-pay | Admitting: Cardiology

## 2015-08-08 ENCOUNTER — Ambulatory Visit (INDEPENDENT_AMBULATORY_CARE_PROVIDER_SITE_OTHER): Payer: Medicare Other | Admitting: Cardiology

## 2015-08-08 VITALS — BP 122/66 | HR 63 | Ht 70.0 in | Wt 213.0 lb

## 2015-08-08 DIAGNOSIS — R079 Chest pain, unspecified: Secondary | ICD-10-CM

## 2015-08-08 DIAGNOSIS — E785 Hyperlipidemia, unspecified: Secondary | ICD-10-CM | POA: Diagnosis not present

## 2015-08-08 DIAGNOSIS — I2510 Atherosclerotic heart disease of native coronary artery without angina pectoris: Secondary | ICD-10-CM

## 2015-08-08 DIAGNOSIS — I208 Other forms of angina pectoris: Secondary | ICD-10-CM

## 2015-08-08 DIAGNOSIS — I5032 Chronic diastolic (congestive) heart failure: Secondary | ICD-10-CM

## 2015-08-08 DIAGNOSIS — I209 Angina pectoris, unspecified: Secondary | ICD-10-CM | POA: Diagnosis not present

## 2015-08-08 DIAGNOSIS — I2089 Other forms of angina pectoris: Secondary | ICD-10-CM

## 2015-08-08 LAB — BASIC METABOLIC PANEL
BUN: 12 mg/dL (ref 7–25)
CALCIUM: 9.4 mg/dL (ref 8.6–10.3)
CO2: 28 mmol/L (ref 20–31)
Chloride: 101 mmol/L (ref 98–110)
Creat: 1.36 mg/dL — ABNORMAL HIGH (ref 0.70–1.18)
GLUCOSE: 112 mg/dL — AB (ref 65–99)
Potassium: 4 mmol/L (ref 3.5–5.3)
SODIUM: 138 mmol/L (ref 135–146)

## 2015-08-08 LAB — LIPID PANEL
CHOL/HDL RATIO: 3.3 ratio (ref <=5.0)
CHOLESTEROL: 121 mg/dL — AB (ref 125–200)
HDL: 37 mg/dL — ABNORMAL LOW (ref >=40)
LDL Cholesterol: 61 mg/dL (ref <130)
Triglycerides: 113 mg/dL (ref <150)
VLDL: 23 mg/dL (ref <30)

## 2015-08-08 MED ORDER — NITROGLYCERIN 0.4 MG SL SUBL
0.4000 mg | SUBLINGUAL_TABLET | SUBLINGUAL | Status: DC | PRN
Start: 2015-08-08 — End: 2017-04-13

## 2015-08-08 NOTE — Patient Instructions (Signed)
Medication Instructions:  No changes today  Labwork: BMET/Lipid profile today  Testing/Procedures: None   Follow-Up: Your physician wants you to follow-up in: 1 year with Dr Aundra Dubin. (December 2017). You will receive a reminder letter in the mail two months in advance. If you don't receive a letter, please call our office to schedule the follow-up appointment.        If you need a refill on your cardiac medications before your next appointment, please call your pharmacy.

## 2015-08-10 NOTE — Progress Notes (Signed)
Patient ID: [name], male   DOB: Apr 01, 1946, 69 y.o.   MRN: [id number] PCP: Dr Harrington Challenger  69 yo with history of CAD s/p CABG and obesity s/p lap banding procedure presents for cardiology followup.  Given exertional dyspnea and chest tightness, he had LHC in 1/15.  This showed no change from prior.  Grafts were patent and there was severe stenosis in distal D1, not amenable to PCI.   Patient has been doing well recently.  Weight is down 14 lbs.  No chest pain or NTG use.  No exertional dyspnea. He continues to work part-time.  He decreased Coreg to 3.125 mg bid and feels much better in terms of energy.  He goes dancing about 3 times/week.   Labs (11/12): LDL 70 Labs (5/13): K 4.1, creatinine 1.1, BNP 48 Labs (12/13): K 3.4, creatinine 1.07, LDL 59, HDL 37 Labs (1/15): K 4.3, creatinine 1.2, HCT 40.3 Labs (3/15): LDL 38, HDL 81, BNP 55, K 4, creatinine 1.2 Labs (6/15): K 4.2, creatinine 1.4, LFTs normal, LDL 66, HDL 35 Labs (8/16): K 4, creatinine 1.6, BNP 64  PMH: 1. OSA: on CPAP.  2. Diabetes mellitus 3. Allergic rhinitis 4. CAD: s/p CABG 1995.  LHC (10/11) with 90% distal D1 (patent proximal D1 stent), total occlusion CFX, total occlusion RCA, no significant stenoses in LAD, SVG-PDA patent, SVG-OM patent, EF 55%.  Medical management.  LHC (5/13) with patent grafts and 90% distal D1 stenosis (no significant change from 10/11).  LHC (1/15) with patent grafts, 75% ISR in D1 stent, 90% stenosis distal D1 (small caliber). D1 not amenable to intervention.  5. Obesity: lap-banding in 5/11.  6. Diastolic CHF: Echo (99991111) with EF 55-60%.   7. TIA: Carotid dopplers in 11/11 with 0-39% bilateral stenosis.  8. OA: Knees, C-spine. TKR 9/13.  9. HTN 10. Hyperlipidemia 11. Diverticulosis 12. Chronic diastolic CHF: Echo ([id number]) with EF 60-65%, mildly dilated RV with normal systolic function.   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  Lives in [place].   FH: Noncontributory.     Current Outpatient Prescriptions  Medication Sig Dispense Refill  . aspirin 81 MG tablet Take 81 mg by mouth daily.    Marland Kitchen b complex vitamins tablet Take 1 tablet by mouth daily.    . carvedilol (COREG) 6.25 MG tablet TAKE ONE TABLET BY MOUTH TWICE DAILY 60 tablet 2  . cetirizine (ZYRTEC) 10 MG chewable tablet Chew 10 mg by mouth daily.    . fluticasone (FLONASE) 50 MCG/ACT nasal spray Place 1 spray into both nostrils daily as needed for allergies or rhinitis.    . furosemide (LASIX) 40 MG tablet Take 40 mg by mouth daily. TAKE ON EXTRA TABLET AT NIGHT FOR EDEMA    . HYDROcodone-acetaminophen (NORCO) 10-325 MG tablet Take 1 tablet by mouth 3 (three) times daily as needed for moderate pain.    . metFORMIN (GLUCOPHAGE) 500 MG tablet Take 500 mg by mouth 2 (two) times daily with a meal.    . Multiple Vitamins-Minerals (CENTRUM ADULTS PO) Take 1 tablet by mouth daily.    Marland Kitchen PARoxetine (PAXIL) 40 MG tablet Take 20 mg by mouth every morning.    . potassium chloride SA (K-DUR,KLOR-CON) 20 MEQ tablet Take by mouth daily. 1 and 1/2 tablets (30 mEq) daily    . rosuvastatin (CRESTOR) 10 MG tablet Take 10 mg by mouth daily.    . tadalafil (CIALIS) 20 MG tablet Take 20 mg by mouth daily as needed  for erectile dysfunction.    . tamsulosin (FLOMAX) 0.4 MG CAPS capsule Take 0.4 mg by mouth daily.    Marland Kitchen testosterone cypionate (DEPOTESTOSTERONE CYPIONATE) 200 MG/ML injection AS DIRECTED    . traZODone (DESYREL) 100 MG tablet Take 100 mg by mouth at bedtime as needed for sleep.    . mirtazapine (REMERON) 15 MG tablet Take 15 mg by mouth at bedtime.    . nitroGLYCERIN (NITROSTAT) 0.4 MG SL tablet Place 1 tablet (0.4 mg total) under the tongue every 5 (five) minutes as needed for chest pain. 25 tablet 3   No current facility-administered medications for this visit.    BP 122/66 mmHg  Pulse 63  Ht 5\' 10"  (1.778 m)  Wt 213 lb (96.616 kg)  BMI 30.56 kg/m2 General: NAD Neck: Thick, JVP 7 cm, no thyromegaly or  thyroid nodule.  Lungs: Clear to auscultation bilaterally with normal respiratory effort. CV: Nondisplaced PMI.  Heart regular S1/S2, no S3/S4, no murmur.  No peripheral edema.  No carotid bruit.  Normal pedal pulses.  Abdomen: Soft, nontender, no hepatosplenomegaly, no distention.  Neurologic: Alert and oriented x 3.  Psych: Normal affect. Extremities: No clubbing or cyanosis.   Assessment/Plan: 1. Diastolic CHF: Patient is doing well on current Lasix dose.  I suspect that he is near-euvolemic.  I will continue the current dose of Lasix and KCl.  BMET today.  2. CAD: Most recent cardiac cath showed stable coronary anatomy.  He has severe stenosis in a distal D1 that is not amenable to intervention. He is not having chest pain. Continue ASA 81, statin, Coreg at lower dose.   3. Hyperlipidemia: Check lipids today.    [name] 08/10/2015

## 2015-08-12 DIAGNOSIS — Z9849 Cataract extraction status, unspecified eye: Secondary | ICD-10-CM | POA: Diagnosis not present

## 2015-08-12 DIAGNOSIS — H538 Other visual disturbances: Secondary | ICD-10-CM | POA: Diagnosis not present

## 2015-08-12 DIAGNOSIS — J069 Acute upper respiratory infection, unspecified: Secondary | ICD-10-CM | POA: Diagnosis not present

## 2015-08-24 DIAGNOSIS — J209 Acute bronchitis, unspecified: Secondary | ICD-10-CM | POA: Diagnosis not present

## 2015-08-28 DIAGNOSIS — R05 Cough: Secondary | ICD-10-CM | POA: Diagnosis not present

## 2015-09-26 ENCOUNTER — Encounter: Payer: Self-pay | Admitting: Cardiology

## 2015-09-26 ENCOUNTER — Encounter: Payer: Self-pay | Admitting: Cardiovascular Disease

## 2015-11-12 DIAGNOSIS — Z7984 Long term (current) use of oral hypoglycemic drugs: Secondary | ICD-10-CM | POA: Diagnosis not present

## 2015-11-12 DIAGNOSIS — I2510 Atherosclerotic heart disease of native coronary artery without angina pectoris: Secondary | ICD-10-CM | POA: Diagnosis not present

## 2015-11-12 DIAGNOSIS — I509 Heart failure, unspecified: Secondary | ICD-10-CM | POA: Diagnosis not present

## 2015-11-12 DIAGNOSIS — R0989 Other specified symptoms and signs involving the circulatory and respiratory systems: Secondary | ICD-10-CM | POA: Diagnosis not present

## 2015-11-12 DIAGNOSIS — B309 Viral conjunctivitis, unspecified: Secondary | ICD-10-CM | POA: Diagnosis not present

## 2015-11-12 DIAGNOSIS — E119 Type 2 diabetes mellitus without complications: Secondary | ICD-10-CM | POA: Diagnosis not present

## 2015-11-12 DIAGNOSIS — R52 Pain, unspecified: Secondary | ICD-10-CM | POA: Diagnosis not present

## 2015-12-13 DIAGNOSIS — M9904 Segmental and somatic dysfunction of sacral region: Secondary | ICD-10-CM | POA: Diagnosis not present

## 2015-12-13 DIAGNOSIS — M9903 Segmental and somatic dysfunction of lumbar region: Secondary | ICD-10-CM | POA: Diagnosis not present

## 2015-12-13 DIAGNOSIS — M5137 Other intervertebral disc degeneration, lumbosacral region: Secondary | ICD-10-CM | POA: Diagnosis not present

## 2015-12-13 DIAGNOSIS — L570 Actinic keratosis: Secondary | ICD-10-CM | POA: Diagnosis not present

## 2015-12-13 DIAGNOSIS — L814 Other melanin hyperpigmentation: Secondary | ICD-10-CM | POA: Diagnosis not present

## 2015-12-13 DIAGNOSIS — M5414 Radiculopathy, thoracic region: Secondary | ICD-10-CM | POA: Diagnosis not present

## 2015-12-13 DIAGNOSIS — M9902 Segmental and somatic dysfunction of thoracic region: Secondary | ICD-10-CM | POA: Diagnosis not present

## 2015-12-13 DIAGNOSIS — L821 Other seborrheic keratosis: Secondary | ICD-10-CM | POA: Diagnosis not present

## 2015-12-13 DIAGNOSIS — M5386 Other specified dorsopathies, lumbar region: Secondary | ICD-10-CM | POA: Diagnosis not present

## 2015-12-13 DIAGNOSIS — D225 Melanocytic nevi of trunk: Secondary | ICD-10-CM | POA: Diagnosis not present

## 2015-12-13 DIAGNOSIS — Z85828 Personal history of other malignant neoplasm of skin: Secondary | ICD-10-CM | POA: Diagnosis not present

## 2015-12-13 DIAGNOSIS — L820 Inflamed seborrheic keratosis: Secondary | ICD-10-CM | POA: Diagnosis not present

## 2015-12-16 DIAGNOSIS — I509 Heart failure, unspecified: Secondary | ICD-10-CM | POA: Diagnosis not present

## 2015-12-16 DIAGNOSIS — R61 Generalized hyperhidrosis: Secondary | ICD-10-CM | POA: Diagnosis not present

## 2015-12-16 DIAGNOSIS — R05 Cough: Secondary | ICD-10-CM | POA: Diagnosis not present

## 2015-12-17 DIAGNOSIS — M5386 Other specified dorsopathies, lumbar region: Secondary | ICD-10-CM | POA: Diagnosis not present

## 2015-12-17 DIAGNOSIS — M9902 Segmental and somatic dysfunction of thoracic region: Secondary | ICD-10-CM | POA: Diagnosis not present

## 2015-12-17 DIAGNOSIS — M9903 Segmental and somatic dysfunction of lumbar region: Secondary | ICD-10-CM | POA: Diagnosis not present

## 2015-12-17 DIAGNOSIS — M9904 Segmental and somatic dysfunction of sacral region: Secondary | ICD-10-CM | POA: Diagnosis not present

## 2015-12-17 DIAGNOSIS — M5137 Other intervertebral disc degeneration, lumbosacral region: Secondary | ICD-10-CM | POA: Diagnosis not present

## 2015-12-17 DIAGNOSIS — M5414 Radiculopathy, thoracic region: Secondary | ICD-10-CM | POA: Diagnosis not present

## 2015-12-20 DIAGNOSIS — M5386 Other specified dorsopathies, lumbar region: Secondary | ICD-10-CM | POA: Diagnosis not present

## 2015-12-20 DIAGNOSIS — M5414 Radiculopathy, thoracic region: Secondary | ICD-10-CM | POA: Diagnosis not present

## 2015-12-20 DIAGNOSIS — M9904 Segmental and somatic dysfunction of sacral region: Secondary | ICD-10-CM | POA: Diagnosis not present

## 2015-12-20 DIAGNOSIS — M9902 Segmental and somatic dysfunction of thoracic region: Secondary | ICD-10-CM | POA: Diagnosis not present

## 2015-12-20 DIAGNOSIS — M9903 Segmental and somatic dysfunction of lumbar region: Secondary | ICD-10-CM | POA: Diagnosis not present

## 2015-12-20 DIAGNOSIS — M5137 Other intervertebral disc degeneration, lumbosacral region: Secondary | ICD-10-CM | POA: Diagnosis not present

## 2015-12-25 ENCOUNTER — Ambulatory Visit (INDEPENDENT_AMBULATORY_CARE_PROVIDER_SITE_OTHER): Payer: Medicare Other | Admitting: Cardiology

## 2015-12-25 ENCOUNTER — Encounter: Payer: Self-pay | Admitting: *Deleted

## 2015-12-25 ENCOUNTER — Encounter: Payer: Self-pay | Admitting: Cardiology

## 2015-12-25 VITALS — BP 114/70 | HR 69 | Ht 70.0 in | Wt 224.0 lb

## 2015-12-25 DIAGNOSIS — E559 Vitamin D deficiency, unspecified: Secondary | ICD-10-CM | POA: Insufficient documentation

## 2015-12-25 DIAGNOSIS — I509 Heart failure, unspecified: Secondary | ICD-10-CM | POA: Insufficient documentation

## 2015-12-25 DIAGNOSIS — E291 Testicular hypofunction: Secondary | ICD-10-CM | POA: Insufficient documentation

## 2015-12-25 DIAGNOSIS — E7800 Pure hypercholesterolemia, unspecified: Secondary | ICD-10-CM | POA: Insufficient documentation

## 2015-12-25 DIAGNOSIS — R0602 Shortness of breath: Secondary | ICD-10-CM | POA: Diagnosis not present

## 2015-12-25 DIAGNOSIS — I5032 Chronic diastolic (congestive) heart failure: Secondary | ICD-10-CM

## 2015-12-25 DIAGNOSIS — I2510 Atherosclerotic heart disease of native coronary artery without angina pectoris: Secondary | ICD-10-CM | POA: Insufficient documentation

## 2015-12-25 DIAGNOSIS — I25118 Atherosclerotic heart disease of native coronary artery with other forms of angina pectoris: Secondary | ICD-10-CM | POA: Diagnosis not present

## 2015-12-25 DIAGNOSIS — M179 Osteoarthritis of knee, unspecified: Secondary | ICD-10-CM | POA: Insufficient documentation

## 2015-12-25 DIAGNOSIS — I10 Essential (primary) hypertension: Secondary | ICD-10-CM | POA: Insufficient documentation

## 2015-12-25 DIAGNOSIS — Z9849 Cataract extraction status, unspecified eye: Secondary | ICD-10-CM | POA: Insufficient documentation

## 2015-12-25 DIAGNOSIS — M1710 Unilateral primary osteoarthritis, unspecified knee: Secondary | ICD-10-CM | POA: Insufficient documentation

## 2015-12-25 DIAGNOSIS — J309 Allergic rhinitis, unspecified: Secondary | ICD-10-CM | POA: Insufficient documentation

## 2015-12-25 LAB — MAGNESIUM: Magnesium: 1.9 mg/dL (ref 1.5–2.5)

## 2015-12-25 LAB — BASIC METABOLIC PANEL
BUN: 16 mg/dL (ref 7–25)
CALCIUM: 9.6 mg/dL (ref 8.6–10.3)
CHLORIDE: 101 mmol/L (ref 98–110)
CO2: 29 mmol/L (ref 20–31)
Creat: 1.58 mg/dL — ABNORMAL HIGH (ref 0.70–1.18)
GLUCOSE: 192 mg/dL — AB (ref 65–99)
Potassium: 4 mmol/L (ref 3.5–5.3)
SODIUM: 140 mmol/L (ref 135–146)

## 2015-12-25 MED ORDER — POTASSIUM CHLORIDE CRYS ER 20 MEQ PO TBCR: 20.0000 meq | EXTENDED_RELEASE_TABLET | Freq: Two times a day (BID) | ORAL | Status: DC

## 2015-12-25 MED ORDER — FUROSEMIDE 40 MG PO TABS: ORAL_TABLET | ORAL | Status: DC

## 2015-12-25 NOTE — Patient Instructions (Addendum)
Medication Instructions:  Increase lasix (furosemide) to 40mg  in the morning and 20mg  (1/2 of a 40mg  tablet)about 4PM  Increase KCL (potassium) to 20 mEq two times a day.  Labwork: BMET/Magnesium today  Testing/Procedures: Your physician has requested that you have an echocardiogram. Echocardiography is a painless test that uses sound waves to create images of your heart. It provides your doctor with information about the size and shape of your heart and how well your heart's chambers and valves are working. This procedure takes approximately one hour. There are no restrictions for this procedure.  Your physician has requested that you have a lexiscan myoview. For further information please visit [email or web address]. Please follow instruction sheet, as given.    Follow-Up: Your physician recommends that you schedule a follow-up appointment in: 3 months with Dr Aundra Dubin.        If you need a refill on your cardiac medications before your next appointment, please call your pharmacy.

## 2015-12-26 NOTE — Progress Notes (Signed)
Patient ID: [name], male   DOB: [birth year], 70 y.o.   MRN: [id number] PCP: Dr Harrington Challenger  70 yo with history of CAD s/p CABG and obesity s/p lap banding procedure presents for cardiology followup.  Given exertional dyspnea and chest tightness, he had LHC in 1/15.  This showed no change from prior.  Grafts were patent and there was severe stenosis in distal D1, not amenable to PCI.   Patient reports significant night sweats over the last month.  Workup with PCP so far is unrevealing.  He has gained weight over the last few weeks (up 9 lbs compared to prior appt).  His ankles have been swelling a lot. He increased his Lasix to 80 mg daily for a few days and swelling is back down.  He is also getting fatigued more easily.  He goes dancing 4 times a week with his wife and is having a harder time keeping up. No chest pain.  No dyspnea walking on flat ground or up a flight of steps.  Chronic right knee pain does limit his ambulation.   ECG: NSR, bigeminal PVCs  Labs (11/12): LDL 70 Labs (5/13): K 4.1, creatinine 1.1, BNP 48 Labs (12/13): K 3.4, creatinine 1.07, LDL 59, HDL 37 Labs (1/15): K 4.3, creatinine 1.2, HCT 40.3 Labs (3/15): LDL 38, HDL 81, BNP 55, K 4, creatinine 1.2 Labs (6/15): K 4.2, creatinine 1.4, LFTs normal, LDL 66, HDL 35 Labs (8/16): K 4, creatinine 1.6, BNP 64 Labs (12/16): LDL 61, HDL 37 Labs (4/17): K 4.2, creatinine 1.35, HCT 38.7, BNP 80  PMH: 1. OSA: on CPAP.  2. Diabetes mellitus 3. Allergic rhinitis 4. CAD: s/p CABG 1995.  LHC (10/11) with 90% distal D1 (patent proximal D1 stent), total occlusion CFX, total occlusion RCA, no significant stenoses in LAD, SVG-PDA patent, SVG-OM patent, EF 55%.  Medical management.  LHC (5/13) with patent grafts and 90% distal D1 stenosis (no significant change from 10/11).  LHC (1/15) with patent grafts, 75% ISR in D1 stent, 90% stenosis distal D1 (small caliber). D1 not amenable to intervention.  5. Obesity: lap-banding in 5/11.  6.  Diastolic CHF: Echo (99991111) with EF 55-60%.   7. TIA: Carotid dopplers in 11/11 with 0-39% bilateral stenosis.  8. OA: Knees, C-spine. TKR 9/13.  9. HTN 10. Hyperlipidemia 11. Diverticulosis 12. Chronic diastolic CHF: Echo ([id number]) with EF 60-65%, mildly dilated RV with normal systolic function.   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   FH: Noncontributory.    Current Outpatient Prescriptions  Medication Sig Dispense Refill  . aspirin 81 MG tablet Take 81 mg by mouth daily.    Marland Kitchen b complex vitamins tablet Take 1 tablet by mouth daily.    . carvedilol (COREG) 6.25 MG tablet TAKE ONE TABLET BY MOUTH TWICE DAILY 60 tablet 2  . cetirizine (ZYRTEC) 10 MG chewable tablet Chew 10 mg by mouth daily.    . fluticasone (FLONASE) 50 MCG/ACT nasal spray Place 1 spray into both nostrils daily as needed for allergies or rhinitis.    Marland Kitchen HYDROcodone-acetaminophen (NORCO) 10-325 MG tablet Take 1 tablet by mouth 3 (three) times daily as needed for moderate pain.    . metFORMIN (GLUCOPHAGE) 500 MG tablet Take 500 mg by mouth 2 (two) times daily with a meal.    . mirtazapine (REMERON) 15 MG tablet Take 15 mg by mouth at bedtime.    . Multiple Vitamins-Minerals (CENTRUM ADULTS PO) Take 1  tablet by mouth daily.    . nitroGLYCERIN (NITROSTAT) 0.4 MG SL tablet Place 1 tablet (0.4 mg total) under the tongue every 5 (five) minutes as needed for chest pain. 25 tablet 3  . PARoxetine (PAXIL) 40 MG tablet Take 20 mg by mouth every morning.    . rosuvastatin (CRESTOR) 10 MG tablet Take 10 mg by mouth daily.    . tadalafil (CIALIS) 20 MG tablet Take 20 mg by mouth daily as needed for erectile dysfunction.    . tamsulosin (FLOMAX) 0.4 MG CAPS capsule Take 0.4 mg by mouth daily.    Marland Kitchen testosterone cypionate (DEPOTESTOSTERONE CYPIONATE) 200 MG/ML injection AS DIRECTED    . traZODone (DESYREL) 100 MG tablet Take 100 mg by mouth at bedtime as needed for sleep.    . furosemide (LASIX)  40 MG tablet Take 1 tablet (40mg ) by mouth in the morning and 1/2 tablet (20mg ) about 4PM 135 tablet 1  . potassium chloride SA (K-DUR,KLOR-CON) 20 MEQ tablet Take 1 tablet (20 mEq total) by mouth 2 (two) times daily. 180 tablet 1   No current facility-administered medications for this visit.    BP 114/70 mmHg  Pulse 69  Ht 5\' 10"  (1.778 m)  Wt 224 lb (101.606 kg)  BMI 32.14 kg/m2 General: NAD Neck: Thick, JVP 7 cm, no thyromegaly or thyroid nodule.  Lungs: Clear to auscultation bilaterally with normal respiratory effort. CV: Nondisplaced PMI.  Heart regular S1/S2, no S3/S4, no murmur.  Trace edema.  No carotid bruit.  Normal pedal pulses.  Abdomen: Soft, nontender, no hepatosplenomegaly, no distention.  Neurologic: Alert and oriented x 3.  Psych: Normal affect. Extremities: No clubbing or cyanosis.   Assessment/Plan: 1. Diastolic CHF: Weight is up and patient has had more lower extremity edema.  He feels improved after taking Lasix 80 mg daily for several days, and he does not appear volume overloaded on exam now.  - Can cut Lasix back to 40 qam, 20 qpm.  - Increase KCl to 40 daily.  Check BMET today and again in 10 days.  It is possible that some of his fatigue, etc, is due to hypokalemia in setting of taking a higher dose of Lasix for several days without more potassium.  - Repeat echo today.   2. CAD: Most recent cardiac cath showed stable coronary anatomy.  He has severe stenosis in a distal D1 that is not amenable to intervention. He is not having chest pain but is having significantly increased exertional fatigue. - Continue ASA 81, statin, Coreg.   - I will risk stratify with Lexiscan Cardiolite.  3. Hyperlipidemia: Good lipids 12/16.     [name] 12/26/2015

## 2015-12-27 ENCOUNTER — Telehealth: Payer: Self-pay | Admitting: Cardiology

## 2015-12-27 NOTE — Telephone Encounter (Signed)
Have them get him in sooner, should be able to be done.

## 2015-12-27 NOTE — Telephone Encounter (Signed)
Patient c/o Palpitations:  High priority if patient c/o lightheadedness and shortness of breath.  1. How long have you been having palpitations? 12/25/15  2. Are you currently experiencing lightheadedness and shortness of breath? SOB  3. Have you checked your BP and heart rate? (document readings) NO  4. Are you experiencing any other symptoms? NO

## 2015-12-27 NOTE — Telephone Encounter (Signed)
Pt states he is calling to see about getting myoview and echo done sooner than 01/08/16. Pt states symptoms are unchanged and not any worse than at time of appt with Dr Aundra Dubin 12/25/15.  Pt states he is not symptomatic at this time, he did have some exhaustion after dancing last night. Pt states his wife is concerned and feels he needs to have testing completed before 01/08/16.  Pt advised that I will ask [facility] to contact him to try to arrange sooner appts for myoview and echo.  Pt advised to limit physical activity until he can come to office for myoview and echo.

## 2015-12-27 NOTE — Telephone Encounter (Signed)
To Dr Aundra Dubin for review.

## 2015-12-30 ENCOUNTER — Telehealth (HOSPITAL_COMMUNITY): Payer: Self-pay | Admitting: *Deleted

## 2015-12-30 NOTE — Telephone Encounter (Signed)
Left message on voicemail in reference to upcoming appointment scheduled for 01/02/16. Phone number given for a call back so details instructions can be given. Jessly Lebeck W   

## 2015-12-31 ENCOUNTER — Telehealth (HOSPITAL_COMMUNITY): Payer: Self-pay | Admitting: *Deleted

## 2015-12-31 DIAGNOSIS — E1165 Type 2 diabetes mellitus with hyperglycemia: Secondary | ICD-10-CM | POA: Diagnosis not present

## 2015-12-31 DIAGNOSIS — Z7984 Long term (current) use of oral hypoglycemic drugs: Secondary | ICD-10-CM | POA: Diagnosis not present

## 2015-12-31 DIAGNOSIS — Z79899 Other long term (current) drug therapy: Secondary | ICD-10-CM | POA: Diagnosis not present

## 2015-12-31 DIAGNOSIS — E7800 Pure hypercholesterolemia, unspecified: Secondary | ICD-10-CM | POA: Diagnosis not present

## 2015-12-31 DIAGNOSIS — E119 Type 2 diabetes mellitus without complications: Secondary | ICD-10-CM | POA: Diagnosis not present

## 2015-12-31 DIAGNOSIS — E291 Testicular hypofunction: Secondary | ICD-10-CM | POA: Diagnosis not present

## 2015-12-31 DIAGNOSIS — Z125 Encounter for screening for malignant neoplasm of prostate: Secondary | ICD-10-CM | POA: Diagnosis not present

## 2015-12-31 NOTE — Telephone Encounter (Signed)
Patient given detailed instructions per Myocardial Perfusion Study Information Sheet for the test on 01/02/16 at 0730. Patient notified to arrive 15 minutes early and that it is imperative to arrive on time for appointment to keep from having the test rescheduled.  If you need to cancel or reschedule your appointment, please call the office within 24 hours of your appointment. Failure to do so may result in a cancellation of your appointment, and a $50 no show fee. Patient verbalized understanding.Miel Wisener, Ranae Palms

## 2016-01-02 ENCOUNTER — Ambulatory Visit (HOSPITAL_COMMUNITY): Payer: Medicare Other | Attending: Internal Medicine

## 2016-01-02 DIAGNOSIS — I110 Hypertensive heart disease with heart failure: Secondary | ICD-10-CM | POA: Insufficient documentation

## 2016-01-02 DIAGNOSIS — R9439 Abnormal result of other cardiovascular function study: Secondary | ICD-10-CM | POA: Insufficient documentation

## 2016-01-02 DIAGNOSIS — Z8249 Family history of ischemic heart disease and other diseases of the circulatory system: Secondary | ICD-10-CM | POA: Diagnosis not present

## 2016-01-02 DIAGNOSIS — R5383 Other fatigue: Secondary | ICD-10-CM | POA: Insufficient documentation

## 2016-01-02 DIAGNOSIS — R0609 Other forms of dyspnea: Secondary | ICD-10-CM | POA: Insufficient documentation

## 2016-01-02 DIAGNOSIS — R0602 Shortness of breath: Secondary | ICD-10-CM | POA: Diagnosis not present

## 2016-01-02 DIAGNOSIS — R002 Palpitations: Secondary | ICD-10-CM | POA: Insufficient documentation

## 2016-01-02 DIAGNOSIS — I5032 Chronic diastolic (congestive) heart failure: Secondary | ICD-10-CM | POA: Insufficient documentation

## 2016-01-02 LAB — MYOCARDIAL PERFUSION IMAGING
CHL CUP RESTING HR STRESS: 63 {beats}/min
CSEPPHR: 80 {beats}/min
LHR: 0.27
LV dias vol: 148 mL (ref 62–150)
LVSYSVOL: 78 mL
SDS: 4
SRS: 0
SSS: 4
TID: 1

## 2016-01-02 MED ORDER — TECHNETIUM TC 99M SESTAMIBI GENERIC - CARDIOLITE
10.7000 | Freq: Once | INTRAVENOUS | Status: AC | PRN
  Administered 2016-01-02: 11 via INTRAVENOUS

## 2016-01-02 MED ORDER — TECHNETIUM TC 99M SESTAMIBI GENERIC - CARDIOLITE
32.8000 | Freq: Once | INTRAVENOUS | Status: AC | PRN
  Administered 2016-01-02: 32.8 via INTRAVENOUS

## 2016-01-02 MED ORDER — REGADENOSON 0.4 MG/5ML IV SOLN
0.4000 mg | Freq: Once | INTRAVENOUS | Status: AC
  Administered 2016-01-02: 0.4 mg via INTRAVENOUS

## 2016-01-08 ENCOUNTER — Ambulatory Visit (HOSPITAL_COMMUNITY): Payer: Medicare Other | Attending: Cardiology

## 2016-01-08 ENCOUNTER — Encounter (HOSPITAL_COMMUNITY): Payer: Medicare Other

## 2016-01-08 DIAGNOSIS — E669 Obesity, unspecified: Secondary | ICD-10-CM | POA: Diagnosis not present

## 2016-01-08 DIAGNOSIS — E785 Hyperlipidemia, unspecified: Secondary | ICD-10-CM | POA: Diagnosis not present

## 2016-01-08 DIAGNOSIS — Z6832 Body mass index (BMI) 32.0-32.9, adult: Secondary | ICD-10-CM | POA: Insufficient documentation

## 2016-01-08 DIAGNOSIS — I2510 Atherosclerotic heart disease of native coronary artery without angina pectoris: Secondary | ICD-10-CM | POA: Insufficient documentation

## 2016-01-08 DIAGNOSIS — I071 Rheumatic tricuspid insufficiency: Secondary | ICD-10-CM | POA: Diagnosis not present

## 2016-01-08 DIAGNOSIS — R0602 Shortness of breath: Secondary | ICD-10-CM | POA: Diagnosis not present

## 2016-01-08 DIAGNOSIS — E119 Type 2 diabetes mellitus without complications: Secondary | ICD-10-CM | POA: Diagnosis not present

## 2016-01-08 DIAGNOSIS — I110 Hypertensive heart disease with heart failure: Secondary | ICD-10-CM | POA: Diagnosis not present

## 2016-01-08 DIAGNOSIS — I5032 Chronic diastolic (congestive) heart failure: Secondary | ICD-10-CM | POA: Diagnosis not present

## 2016-01-10 DIAGNOSIS — R05 Cough: Secondary | ICD-10-CM | POA: Diagnosis not present

## 2016-01-15 ENCOUNTER — Telehealth: Payer: Self-pay | Admitting: Cardiology

## 2016-01-15 DIAGNOSIS — I25118 Atherosclerotic heart disease of native coronary artery with other forms of angina pectoris: Secondary | ICD-10-CM

## 2016-01-15 DIAGNOSIS — I5032 Chronic diastolic (congestive) heart failure: Secondary | ICD-10-CM

## 2016-01-15 DIAGNOSIS — I340 Nonrheumatic mitral (valve) insufficiency: Secondary | ICD-10-CM

## 2016-01-15 NOTE — Telephone Encounter (Signed)
Returned call to patient's wife (DPR) and informed her that her husband was notified of results on 5/19. She states they were waiting on a call back from the nurse regarding what to do about the "leaky valve". Explained to patient's wife that this was noted on his 2014 echo as well and the severity of this has not changed much in 3 years. She also states patient has PACs and PVCs - explained this is not noted in the echo report. Patient's wife would like call back about what will be the next steps. She states he is "70 years old" and this is something that shouldn't wait. Apologized that they have not gotten any more information thus far but that I will route this message to the MD and his nurse for follow up. She requested a call back on their home # [phone]

## 2016-01-15 NOTE — Telephone Encounter (Signed)
I had a discussion with [name] today.  Having ongoing new exertional dyspnea and fatigue x 3-4 weeks.  I reviewed echo, at least moderate Jason and may be worse.  Needs TEE to assess severity of mitral regurgitation.  Also, has equivocal Cardiolite for inferior ischemia.  Would also do right and left heart cath (R radial and R brachial access).  I discussed risks/benefits with patient and he agrees to proceed with study.  Please call and arrange for Wednesday or Thursday morning next week or Friday next week for me.  Will need to do TEE and LHC/RHC on the same day (I will do both).

## 2016-01-15 NOTE — Telephone Encounter (Signed)
New message   Pt wife is calling she states that pt has been waiting for a call from the rn about pt echo  She said it has been over a week

## 2016-01-16 ENCOUNTER — Encounter: Payer: Self-pay | Admitting: *Deleted

## 2016-01-16 ENCOUNTER — Other Ambulatory Visit: Payer: Medicare Other | Admitting: *Deleted

## 2016-01-16 DIAGNOSIS — I340 Nonrheumatic mitral (valve) insufficiency: Secondary | ICD-10-CM | POA: Diagnosis not present

## 2016-01-16 DIAGNOSIS — I348 Other nonrheumatic mitral valve disorders: Secondary | ICD-10-CM | POA: Diagnosis not present

## 2016-01-16 DIAGNOSIS — I25118 Atherosclerotic heart disease of native coronary artery with other forms of angina pectoris: Secondary | ICD-10-CM | POA: Diagnosis not present

## 2016-01-16 DIAGNOSIS — I5032 Chronic diastolic (congestive) heart failure: Secondary | ICD-10-CM

## 2016-01-16 LAB — BASIC METABOLIC PANEL
BUN: 28 mg/dL — AB (ref 7–25)
CALCIUM: 9.1 mg/dL (ref 8.6–10.3)
CO2: 23 mmol/L (ref 20–31)
CREATININE: 1.49 mg/dL — AB (ref 0.70–1.18)
Chloride: 101 mmol/L (ref 98–110)
GLUCOSE: 196 mg/dL — AB (ref 65–99)
Potassium: 3.7 mmol/L (ref 3.5–5.3)
SODIUM: 138 mmol/L (ref 135–146)

## 2016-01-16 LAB — CBC WITH DIFFERENTIAL/PLATELET
Basophils Absolute: 121 cells/uL (ref 0–200)
Basophils Relative: 1 %
EOS PCT: 5 %
Eosinophils Absolute: 605 cells/uL — ABNORMAL HIGH (ref 15–500)
HCT: 43.8 % (ref 38.5–50.0)
Hemoglobin: 14.7 g/dL (ref 13.2–17.1)
LYMPHS ABS: 1815 {cells}/uL (ref 850–3900)
LYMPHS PCT: 15 %
MCH: 31.6 pg (ref 27.0–33.0)
MCHC: 33.6 g/dL (ref 32.0–36.0)
MCV: 94.2 fL (ref 80.0–100.0)
MONOS PCT: 6 %
MPV: 9.7 fL (ref 7.5–12.5)
Monocytes Absolute: 726 cells/uL (ref 200–950)
NEUTROS PCT: 73 %
Neutro Abs: 8833 cells/uL — ABNORMAL HIGH (ref 1500–7800)
Platelets: 225 10*3/uL (ref 140–400)
RBC: 4.65 MIL/uL (ref 4.20–5.80)
RDW: 14.6 % (ref 11.0–15.0)
WBC: 12.1 10*3/uL — AB (ref 3.8–10.8)

## 2016-01-16 NOTE — Telephone Encounter (Signed)
Pt advised TEE, R and LHC scheduled for 01/23/16, pre procedure lab scheduled for today.  Pt advised to hold metformin starting evening dose 01/22/16 and for 48 hours after cath, hold Cialis starting today, this information  is included in copy of procedure instructions left a front desk for pt to pick up today.

## 2016-01-17 LAB — PROTIME-INR
INR: 1.1 (ref <1.50)
PROTHROMBIN TIME: 14.3 s (ref 11.6–15.2)

## 2016-01-23 ENCOUNTER — Other Ambulatory Visit (HOSPITAL_COMMUNITY): Payer: Self-pay | Admitting: Cardiology

## 2016-01-23 ENCOUNTER — Ambulatory Visit (HOSPITAL_BASED_OUTPATIENT_CLINIC_OR_DEPARTMENT_OTHER): Payer: Medicare Other

## 2016-01-23 ENCOUNTER — Encounter (HOSPITAL_COMMUNITY): Payer: Self-pay

## 2016-01-23 ENCOUNTER — Encounter (HOSPITAL_COMMUNITY)
Admission: RE | Disposition: A | Discharge status: Home / Self Care | Payer: Self-pay | Source: Ambulatory Visit | Attending: Cardiology

## 2016-01-23 ENCOUNTER — Ambulatory Visit (HOSPITAL_COMMUNITY)
Admission: RE | Admit: 2016-01-23 | Discharge: 2016-01-23 | Disposition: A | Discharge status: Home / Self Care | Payer: Medicare Other | Source: Ambulatory Visit | Attending: Cardiology | Admitting: Cardiology

## 2016-01-23 DIAGNOSIS — I5032 Chronic diastolic (congestive) heart failure: Secondary | ICD-10-CM | POA: Diagnosis not present

## 2016-01-23 DIAGNOSIS — E669 Obesity, unspecified: Secondary | ICD-10-CM | POA: Diagnosis not present

## 2016-01-23 DIAGNOSIS — M170 Bilateral primary osteoarthritis of knee: Secondary | ICD-10-CM | POA: Diagnosis not present

## 2016-01-23 DIAGNOSIS — Z9884 Bariatric surgery status: Secondary | ICD-10-CM | POA: Insufficient documentation

## 2016-01-23 DIAGNOSIS — E876 Hypokalemia: Secondary | ICD-10-CM | POA: Insufficient documentation

## 2016-01-23 DIAGNOSIS — I2582 Chronic total occlusion of coronary artery: Secondary | ICD-10-CM | POA: Insufficient documentation

## 2016-01-23 DIAGNOSIS — Y712 Prosthetic and other implants, materials and accessory cardiovascular devices associated with adverse incidents: Secondary | ICD-10-CM | POA: Diagnosis not present

## 2016-01-23 DIAGNOSIS — E785 Hyperlipidemia, unspecified: Secondary | ICD-10-CM | POA: Insufficient documentation

## 2016-01-23 DIAGNOSIS — I110 Hypertensive heart disease with heart failure: Secondary | ICD-10-CM | POA: Insufficient documentation

## 2016-01-23 DIAGNOSIS — I340 Nonrheumatic mitral (valve) insufficiency: Secondary | ICD-10-CM | POA: Insufficient documentation

## 2016-01-23 DIAGNOSIS — I25810 Atherosclerosis of coronary artery bypass graft(s) without angina pectoris: Secondary | ICD-10-CM | POA: Diagnosis not present

## 2016-01-23 DIAGNOSIS — Z8673 Personal history of transient ischemic attack (TIA), and cerebral infarction without residual deficits: Secondary | ICD-10-CM | POA: Diagnosis not present

## 2016-01-23 DIAGNOSIS — Z7982 Long term (current) use of aspirin: Secondary | ICD-10-CM | POA: Insufficient documentation

## 2016-01-23 DIAGNOSIS — I2510 Atherosclerotic heart disease of native coronary artery without angina pectoris: Secondary | ICD-10-CM | POA: Diagnosis not present

## 2016-01-23 DIAGNOSIS — Z6832 Body mass index (BMI) 32.0-32.9, adult: Secondary | ICD-10-CM | POA: Insufficient documentation

## 2016-01-23 DIAGNOSIS — G4733 Obstructive sleep apnea (adult) (pediatric): Secondary | ICD-10-CM | POA: Insufficient documentation

## 2016-01-23 DIAGNOSIS — E119 Type 2 diabetes mellitus without complications: Secondary | ICD-10-CM | POA: Insufficient documentation

## 2016-01-23 DIAGNOSIS — Z7984 Long term (current) use of oral hypoglycemic drugs: Secondary | ICD-10-CM | POA: Insufficient documentation

## 2016-01-23 DIAGNOSIS — J309 Allergic rhinitis, unspecified: Secondary | ICD-10-CM | POA: Insufficient documentation

## 2016-01-23 DIAGNOSIS — T82855A Stenosis of coronary artery stent, initial encounter: Secondary | ICD-10-CM | POA: Diagnosis not present

## 2016-01-23 DIAGNOSIS — Z951 Presence of aortocoronary bypass graft: Secondary | ICD-10-CM | POA: Diagnosis not present

## 2016-01-23 HISTORY — PX: CARDIAC CATHETERIZATION: SHX172

## 2016-01-23 HISTORY — PX: TEE WITHOUT CARDIOVERSION: SHX5443

## 2016-01-23 LAB — GLUCOSE, CAPILLARY
GLUCOSE-CAPILLARY: 182 mg/dL — AB (ref 65–99)
GLUCOSE-CAPILLARY: 156 mg/dL — AB (ref 65–99)

## 2016-01-23 LAB — POCT I-STAT 3, VENOUS BLOOD GAS (G3P V)
ACID-BASE DEFICIT: 1 mmol/L (ref 0.0–2.0)
Acid-base deficit: 4 mmol/L — ABNORMAL HIGH (ref 0.0–2.0)
Bicarbonate: 22 mEq/L (ref 20.0–24.0)
Bicarbonate: 24.9 mEq/L — ABNORMAL HIGH (ref 20.0–24.0)
O2 Saturation: 57 %
O2 Saturation: 57 %
PCO2 VEN: 43.1 mmHg — AB (ref 45.0–50.0)
PH VEN: 7.317 — AB (ref 7.250–7.300)
TCO2: 23 mmol/L (ref 0–100)
TCO2: 26 mmol/L (ref 0–100)
pCO2, Ven: 44.9 mmHg — ABNORMAL LOW (ref 45.0–50.0)
pH, Ven: 7.352 — ABNORMAL HIGH (ref 7.250–7.300)
pO2, Ven: 32 mmHg (ref 31.0–45.0)
pO2, Ven: 32 mmHg (ref 31.0–45.0)

## 2016-01-23 SURGERY — ECHOCARDIOGRAM, TRANSESOPHAGEAL
Anesthesia: Moderate Sedation

## 2016-01-23 SURGERY — RIGHT/LEFT HEART CATH AND CORONARY ANGIOGRAPHY

## 2016-01-23 MED ORDER — VERAPAMIL HCL 2.5 MG/ML IV SOLN
INTRAVENOUS | Status: AC
  Filled 2016-01-23: qty 2

## 2016-01-23 MED ORDER — SODIUM CHLORIDE 0.9 % IV SOLN: 250.0000 mL | INTRAVENOUS | Status: DC | PRN

## 2016-01-23 MED ORDER — IOPAMIDOL (ISOVUE-370) INJECTION 76%
INTRAVENOUS | Status: AC
  Filled 2016-01-23: qty 100

## 2016-01-23 MED ORDER — ASPIRIN 81 MG PO CHEW
81.0000 mg | CHEWABLE_TABLET | ORAL | Status: AC
  Administered 2016-01-23: 81 mg via ORAL
  Filled 2016-01-23: qty 1

## 2016-01-23 MED ORDER — SODIUM CHLORIDE 0.9 % WEIGHT BASED INFUSION: 3.0000 mL/kg/h | INTRAVENOUS | Status: DC

## 2016-01-23 MED ORDER — SODIUM CHLORIDE 0.9% FLUSH: 3.0000 mL | INTRAVENOUS | Status: DC | PRN

## 2016-01-23 MED ORDER — MIDAZOLAM HCL 2 MG/2ML IJ SOLN
INTRAMUSCULAR | Status: DC | PRN
  Administered 2016-01-23 (×2): 1 mg via INTRAVENOUS

## 2016-01-23 MED ORDER — MIDAZOLAM HCL 5 MG/ML IJ SOLN
INTRAMUSCULAR | Status: AC
Start: 2016-01-23 — End: 2016-01-23
  Filled 2016-01-23: qty 2

## 2016-01-23 MED ORDER — LIDOCAINE VISCOUS 2 % MT SOLN
OROMUCOSAL | Status: AC
  Filled 2016-01-23: qty 15

## 2016-01-23 MED ORDER — LIDOCAINE HCL (PF) 1 % IJ SOLN
INTRAMUSCULAR | Status: AC
  Filled 2016-01-23: qty 30

## 2016-01-23 MED ORDER — ONDANSETRON HCL 4 MG/2ML IJ SOLN: 4.0000 mg | Freq: Four times a day (QID) | INTRAMUSCULAR | Status: DC | PRN

## 2016-01-23 MED ORDER — SODIUM CHLORIDE 0.9 % IV SOLN: INTRAVENOUS | Status: DC

## 2016-01-23 MED ORDER — MIDAZOLAM HCL 2 MG/2ML IJ SOLN
INTRAMUSCULAR | Status: AC
  Filled 2016-01-23: qty 2

## 2016-01-23 MED ORDER — SODIUM CHLORIDE 0.9 % WEIGHT BASED INFUSION: 1.0000 mL/kg/h | INTRAVENOUS | Status: DC

## 2016-01-23 MED ORDER — SODIUM CHLORIDE 0.9% FLUSH: 3.0000 mL | Freq: Two times a day (BID) | INTRAVENOUS | Status: DC

## 2016-01-23 MED ORDER — LIDOCAINE HCL (PF) 1 % IJ SOLN
INTRAMUSCULAR | Status: DC | PRN
  Administered 2016-01-23 (×2): 2 mL

## 2016-01-23 MED ORDER — FENTANYL CITRATE (PF) 100 MCG/2ML IJ SOLN
INTRAMUSCULAR | Status: AC
  Filled 2016-01-23: qty 2

## 2016-01-23 MED ORDER — HEPARIN SODIUM (PORCINE) 1000 UNIT/ML IJ SOLN
INTRAMUSCULAR | Status: DC | PRN
  Administered 2016-01-23: 5000 [IU] via INTRAVENOUS

## 2016-01-23 MED ORDER — BUTAMBEN-TETRACAINE-BENZOCAINE 2-2-14 % EX AERO
INHALATION_SPRAY | CUTANEOUS | Status: DC | PRN
  Administered 2016-01-23: 2 via TOPICAL

## 2016-01-23 MED ORDER — IOPAMIDOL (ISOVUE-370) INJECTION 76%
INTRAVENOUS | Status: DC | PRN
  Administered 2016-01-23: 110 mL via INTRAVENOUS

## 2016-01-23 MED ORDER — HEPARIN (PORCINE) IN NACL 2-0.9 UNIT/ML-% IJ SOLN
INTRAMUSCULAR | Status: DC | PRN
  Administered 2016-01-23: 1000 mL

## 2016-01-23 MED ORDER — HEPARIN (PORCINE) IN NACL 2-0.9 UNIT/ML-% IJ SOLN
INTRAMUSCULAR | Status: AC
  Filled 2016-01-23: qty 1000

## 2016-01-23 MED ORDER — FENTANYL CITRATE (PF) 100 MCG/2ML IJ SOLN
INTRAMUSCULAR | Status: DC | PRN
  Administered 2016-01-23 (×2): 25 ug via INTRAVENOUS

## 2016-01-23 MED ORDER — MIDAZOLAM HCL 10 MG/2ML IJ SOLN
INTRAMUSCULAR | Status: DC | PRN
  Administered 2016-01-23: 2 mg via INTRAVENOUS

## 2016-01-23 MED ORDER — HEPARIN SODIUM (PORCINE) 1000 UNIT/ML IJ SOLN
INTRAMUSCULAR | Status: AC
  Filled 2016-01-23: qty 1

## 2016-01-23 MED ORDER — IOPAMIDOL (ISOVUE-370) INJECTION 76%
INTRAVENOUS | Status: AC
  Filled 2016-01-23: qty 50

## 2016-01-23 MED ORDER — DIPHENHYDRAMINE HCL 50 MG/ML IJ SOLN
INTRAMUSCULAR | Status: AC
  Filled 2016-01-23: qty 1

## 2016-01-23 MED ORDER — ACETAMINOPHEN 325 MG PO TABS: 650.0000 mg | ORAL_TABLET | ORAL | Status: DC | PRN

## 2016-01-23 MED ORDER — FENTANYL CITRATE (PF) 100 MCG/2ML IJ SOLN
INTRAMUSCULAR | Status: DC | PRN
  Administered 2016-01-23: 25 ug via INTRAVENOUS

## 2016-01-23 SURGICAL SUPPLY — 16 items
CATH BALLN WEDGE 5F 110CM (CATHETERS) ×2 IMPLANT
CATH INFINITI 5 FR JL3.5 (CATHETERS) ×2 IMPLANT
CATH INFINITI 5 FR RCB (CATHETERS) ×2 IMPLANT
CATH INFINITI 5FR ANG PIGTAIL (CATHETERS) ×2 IMPLANT
CATH INFINITI JR4 5F (CATHETERS) ×2 IMPLANT
DEVICE RAD COMP TR BAND LRG (VASCULAR PRODUCTS) ×2 IMPLANT
GLIDESHEATH SLEND SS 6F .021 (SHEATH) ×2 IMPLANT
GUIDEWIRE .025 260CM (WIRE) ×2 IMPLANT
KIT HEART LEFT (KITS) ×3 IMPLANT
KIT HEART RIGHT NAMIC (KITS) ×3 IMPLANT
PACK CARDIAC CATHETERIZATION (CUSTOM PROCEDURE TRAY) ×3 IMPLANT
SHEATH FAST CATH BRACH 5F 5CM (SHEATH) ×2 IMPLANT
SYR MEDRAD MARK V 150ML (SYRINGE) ×3 IMPLANT
TRANSDUCER W/STOPCOCK (MISCELLANEOUS) ×6 IMPLANT
TUBING CIL FLEX 10 FLL-RA (TUBING) ×3 IMPLANT
WIRE EMERALD 3MM-J .025X260CM (WIRE) ×2 IMPLANT

## 2016-01-23 NOTE — Progress Notes (Signed)
  Echocardiogram 2D Echocardiogram has been performed.  [name] 01/23/2016, 9:51 AM

## 2016-01-23 NOTE — CV Procedure (Signed)
Procedure: TEE  Indication: Mitral regurgitation  Sedation: Versed 2 mg IV, Fentanyl 25 mcg IV  Findings: Please see echo section for full report.  Normal LV size with moderate LV hypertrophy.  EF 55-60%.  Normal wall motion.  Mildly dilated RV with normal systolic function.  Peak RV-RA gradient 45 mmHg.  Mild TR.  Trileaflet aortic valve with no stenosis or regurgitation. Mild-moderately dilated LA with no LAA thrombus.  Normal RA.  Small PFO.  There was eccentric, posteriorly-directed MR.  Due to eccentricity, unable to measure PISA.  Suspect severe MR, wraps around left atrium.  However, there was no systolic flow reversal in the pulmonary venous doppler pattern.  There was a small area of prolapse of a portion of the anterior leaflet. Normal caliber aorta with mild plaque.   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   [name] 01/23/2016

## 2016-01-23 NOTE — Interval H&P Note (Signed)
Cath Lab Visit (complete for each Cath Lab visit)  Clinical Evaluation Leading to the Procedure:   ACS: No.  Non-ACS:    Anginal Classification: CCS III  Anti-ischemic medical therapy: Minimal Therapy (1 class of medications)  Non-Invasive Test Results: Intermediate-risk stress test findings: cardiac mortality 1-3%/year  Prior CABG: Previous CABG      History and Physical Interval Note:  01/23/2016 12:08 PM  Rich Brave  has presented today for surgery, with the diagnosis of fatigue - abnormal myview  The various methods of treatment have been discussed with the patient and family. After consideration of risks, benefits and other options for treatment, the patient has consented to  Procedure(s): Right/Left Heart Cath and Coronary Angiography (N/A) as a surgical intervention .  The patient's history has been reviewed, patient examined, no change in status, stable for surgery.  I have reviewed the patient's chart and labs.  Questions were answered to the patient's satisfaction.     Eldred Sooy [employer]

## 2016-01-23 NOTE — H&P (View-Only) (Signed)
Patient ID: [name], male   DOB: 04-Oct-1945, 70 y.o.   MRN: [id number] PCP: Dr Harrington Challenger  70 yo with history of CAD s/p CABG and obesity s/p lap banding procedure presents for cardiology followup.  Given exertional dyspnea and chest tightness, he had LHC in 1/15.  This showed no change from prior.  Grafts were patent and there was severe stenosis in distal D1, not amenable to PCI.   Patient reports significant night sweats over the last month.  Workup with PCP so far is unrevealing.  He has gained weight over the last few weeks (up 9 lbs compared to prior appt).  His ankles have been swelling a lot. He increased his Lasix to 80 mg daily for a few days and swelling is back down.  He is also getting fatigued more easily.  He goes dancing 4 times a week with his wife and is having a harder time keeping up. No chest pain.  No dyspnea walking on flat ground or up a flight of steps.  Chronic right knee pain does limit his ambulation.   ECG: NSR, bigeminal PVCs  Labs (11/12): LDL 70 Labs (5/13): K 4.1, creatinine 1.1, BNP 48 Labs (12/13): K 3.4, creatinine 1.07, LDL 59, HDL 37 Labs (1/15): K 4.3, creatinine 1.2, HCT 40.3 Labs (3/15): LDL 38, HDL 81, BNP 55, K 4, creatinine 1.2 Labs (6/15): K 4.2, creatinine 1.4, LFTs normal, LDL 66, HDL 35 Labs (8/16): K 4, creatinine 1.6, BNP 64 Labs (12/16): LDL 61, HDL 37 Labs (4/17): K 4.2, creatinine 1.35, HCT 38.7, BNP 80  PMH: 1. OSA: on CPAP.  2. Diabetes mellitus 3. Allergic rhinitis 4. CAD: s/p CABG 1995.  LHC (10/11) with 90% distal D1 (patent proximal D1 stent), total occlusion CFX, total occlusion RCA, no significant stenoses in LAD, SVG-PDA patent, SVG-OM patent, EF 55%.  Medical management.  LHC (5/13) with patent grafts and 90% distal D1 stenosis (no significant change from 10/11).  LHC (1/15) with patent grafts, 75% ISR in D1 stent, 90% stenosis distal D1 (small caliber). D1 not amenable to intervention.  5. Obesity: lap-banding in 5/11.  6.  Diastolic CHF: Echo (99991111) with EF 55-60%.   7. TIA: Carotid dopplers in 11/11 with 0-39% bilateral stenosis.  8. OA: Knees, C-spine. TKR 9/13.  9. HTN 10. Hyperlipidemia 11. Diverticulosis 12. Chronic diastolic CHF: Echo ([id number]) with EF 60-65%, mildly dilated RV with normal systolic function.   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   FH: Noncontributory.    Current Outpatient Prescriptions  Medication Sig Dispense Refill  . aspirin 81 MG tablet Take 81 mg by mouth daily.    Marland Kitchen b complex vitamins tablet Take 1 tablet by mouth daily.    . carvedilol (COREG) 6.25 MG tablet TAKE ONE TABLET BY MOUTH TWICE DAILY 60 tablet 2  . cetirizine (ZYRTEC) 10 MG chewable tablet Chew 10 mg by mouth daily.    . fluticasone (FLONASE) 50 MCG/ACT nasal spray Place 1 spray into both nostrils daily as needed for allergies or rhinitis.    Marland Kitchen HYDROcodone-acetaminophen (NORCO) 10-325 MG tablet Take 1 tablet by mouth 3 (three) times daily as needed for moderate pain.    . metFORMIN (GLUCOPHAGE) 500 MG tablet Take 500 mg by mouth 2 (two) times daily with a meal.    . mirtazapine (REMERON) 15 MG tablet Take 15 mg by mouth at bedtime.    . Multiple Vitamins-Minerals (CENTRUM ADULTS PO) Take 1  tablet by mouth daily.    . nitroGLYCERIN (NITROSTAT) 0.4 MG SL tablet Place 1 tablet (0.4 mg total) under the tongue every 5 (five) minutes as needed for chest pain. 25 tablet 3  . PARoxetine (PAXIL) 40 MG tablet Take 20 mg by mouth every morning.    . rosuvastatin (CRESTOR) 10 MG tablet Take 10 mg by mouth daily.    . tadalafil (CIALIS) 20 MG tablet Take 20 mg by mouth daily as needed for erectile dysfunction.    . tamsulosin (FLOMAX) 0.4 MG CAPS capsule Take 0.4 mg by mouth daily.    Marland Kitchen testosterone cypionate (DEPOTESTOSTERONE CYPIONATE) 200 MG/ML injection AS DIRECTED    . traZODone (DESYREL) 100 MG tablet Take 100 mg by mouth at bedtime as needed for sleep.    . furosemide (LASIX)  40 MG tablet Take 1 tablet (40mg ) by mouth in the morning and 1/2 tablet (20mg ) about 4PM 135 tablet 1  . potassium chloride SA (K-DUR,KLOR-CON) 20 MEQ tablet Take 1 tablet (20 mEq total) by mouth 2 (two) times daily. 180 tablet 1   No current facility-administered medications for this visit.    BP 114/70 mmHg  Pulse 69  Ht 5\' 10"  (1.778 m)  Wt 224 lb (101.606 kg)  BMI 32.14 kg/m2 General: NAD Neck: Thick, JVP 7 cm, no thyromegaly or thyroid nodule.  Lungs: Clear to auscultation bilaterally with normal respiratory effort. CV: Nondisplaced PMI.  Heart regular S1/S2, no S3/S4, no murmur.  Trace edema.  No carotid bruit.  Normal pedal pulses.  Abdomen: Soft, nontender, no hepatosplenomegaly, no distention.  Neurologic: Alert and oriented x 3.  Psych: Normal affect. Extremities: No clubbing or cyanosis.   Assessment/Plan: 1. Diastolic CHF: Weight is up and patient has had more lower extremity edema.  He feels improved after taking Lasix 80 mg daily for several days, and he does not appear volume overloaded on exam now.  - Can cut Lasix back to 40 qam, 20 qpm.  - Increase KCl to 40 daily.  Check BMET today and again in 10 days.  It is possible that some of his fatigue, etc, is due to hypokalemia in setting of taking a higher dose of Lasix for several days without more potassium.  - Repeat echo today.   2. CAD: Most recent cardiac cath showed stable coronary anatomy.  He has severe stenosis in a distal D1 that is not amenable to intervention. He is not having chest pain but is having significantly increased exertional fatigue. - Continue ASA 81, statin, Coreg.   - I will risk stratify with Lexiscan Cardiolite.  3. Hyperlipidemia: Good lipids 12/16.     [name] 12/26/2015

## 2016-01-23 NOTE — Interval H&P Note (Signed)
History and Physical Interval Note:  01/23/2016 8:16 AM  [name]  has presented today for surgery, with the diagnosis of mitral regurgitation and shortness of breath  The various methods of treatment have been discussed with the patient and family. After consideration of risks, benefits and other options for treatment, the patient has consented to  Procedure(s): TRANSESOPHAGEAL ECHOCARDIOGRAM (TEE) (N/A) as a surgical intervention .  The patient's history has been reviewed, patient examined, no change in status, stable for surgery.  I have reviewed the patient's chart and labs.  Questions were answered to the patient's satisfaction.     [employer]

## 2016-01-23 NOTE — Discharge Instructions (Signed)
Radial Site Care °Refer to this sheet in the next few weeks. These instructions provide you with information about caring for yourself after your procedure. Your health care provider may also give you more specific instructions. Your treatment has been planned according to current medical practices, but problems sometimes occur. Call your health care provider if you have any problems or questions after your procedure. °WHAT TO EXPECT AFTER THE PROCEDURE °After your procedure, it is typical to have the following: °· Bruising at the radial site that usually fades within 1-2 weeks. °· Blood collecting in the tissue (hematoma) that may be painful to the touch. It should usually decrease in size and tenderness within 1-2 weeks. °HOME CARE INSTRUCTIONS °· Take medicines only as directed by your health care provider. °· You may shower 24-48 hours after the procedure or as directed by your health care provider. Remove the bandage (dressing) and gently wash the site with plain soap and water. Pat the area dry with a clean towel. Do not rub the site, because this may cause bleeding. °· Do not take baths, swim, or use a hot tub until your health care provider approves. °· Check your insertion site every day for redness, swelling, or drainage. °· Do not apply powder or lotion to the site. °· Do not flex or bend the affected arm for 24 hours or as directed by your health care provider. °· Do not push or pull heavy objects with the affected arm for 24 hours or as directed by your health care provider. °· Do not lift over 10 lb (4.5 kg) for 5 days after your procedure or as directed by your health care provider. °· Ask your health care provider when it is okay to: °¨ Return to work or school. °¨ Resume usual physical activities or sports. °¨ Resume sexual activity. °· Do not drive home if you are discharged the same day as the procedure. Have someone else drive you. °· You may drive 24 hours after the procedure unless otherwise  instructed by your health care provider. °· Do not operate machinery or power tools for 24 hours after the procedure. °· If your procedure was done as an outpatient procedure, which means that you went home the same day as your procedure, a responsible adult should be with you for the first 24 hours after you arrive home. °· Keep all follow-up visits as directed by your health care provider. This is important. °SEEK MEDICAL CARE IF: °· You have a fever. °· You have chills. °· You have increased bleeding from the radial site. Hold pressure on the site. °SEEK IMMEDIATE MEDICAL CARE IF: °· You have unusual pain at the radial site. °· You have redness, warmth, or swelling at the radial site. °· You have drainage (other than a small amount of blood on the dressing) from the radial site. °· The radial site is bleeding, and the bleeding does not stop after 30 minutes of holding steady pressure on the site. °· Your arm or hand becomes pale, cool, tingly, or numb. °  °This information is not intended to replace advice given to you by your health care provider. Make sure you discuss any questions you have with your health care provider. °  °Document Released: 09/12/2010 Document Revised: 08/31/2014 Document Reviewed: 02/26/2014 °[employer] Interactive Patient Education ©2016 [employer] ° °

## 2016-01-24 ENCOUNTER — Encounter (HOSPITAL_COMMUNITY): Payer: Self-pay | Admitting: Cardiology

## 2016-01-24 MED FILL — Verapamil HCl IV Soln 2.5 MG/ML: INTRAVENOUS | Qty: 2 | Status: AC

## 2016-02-03 ENCOUNTER — Encounter: Payer: Self-pay | Admitting: Surgery

## 2016-02-03 ENCOUNTER — Institutional Professional Consult (permissible substitution) (INDEPENDENT_AMBULATORY_CARE_PROVIDER_SITE_OTHER): Payer: Medicare Other | Admitting: Surgery

## 2016-02-03 VITALS — BP 120/73 | HR 74 | Resp 16 | Ht 69.0 in | Wt 215.0 lb

## 2016-02-03 DIAGNOSIS — I340 Nonrheumatic mitral (valve) insufficiency: Secondary | ICD-10-CM

## 2016-02-03 DIAGNOSIS — I5032 Chronic diastolic (congestive) heart failure: Secondary | ICD-10-CM

## 2016-02-04 ENCOUNTER — Encounter: Payer: Self-pay | Admitting: Surgery

## 2016-02-04 NOTE — Progress Notes (Signed)
Cardiothoracic Surgery Consultation  PCP is  Melinda Crutch, MD Referring Provider is [name], MD  Chief Complaint  Patient presents with  . Mitral Regurgitation    eval for MVR.Marland KitchenMarland KitchenSTRESS 01/02/16, ECHO 01/08/16, CATH/TEE 61/17    HPI:  The patient is a 70 year old gentleman with a history of hypertension, hyperlipidemia, diabetes and coronary artery disease s/p CABG by me in 1995 with SVG's to the OM and PDA. He subsequently underwent stenting of a large D1. He reports a several month history of exertional fatigue and shortness of breath as well as weight gain and ankle swelling. He has some chest pressure associated with this. No symptoms at rest but he notices it when walking up an incline, working over his head or dancing with his wife. A nuclear stress test on 01/02/2016 showed prior inferior infarct with mild peri-infarct ischemia. LVEF was 47%. There were no ST segment changes during stress. He underwent an echo on 01/08/2016 that showed normal LV function with variable eccentric MR felt to be questionably mild to moderate with restricted posterior leaflet motion. A TEE on 6/1 by Dr. Aundra Dubin showed an EF of 55-60%. There was eccentric, posteriorly-directed MR that was felt to be probably severe and wrapped around the atrium but there was no systolic flow reversal in the pulmonary veins. There was some prolapse of the anterior leaflet. He also underwent cath on 6/1 showing no significant change from his cath in 2015 with patent grafts to the PDA and OM. There is a diagonal that is about the same size as the LAD but small caliber that has a 90% distal lesion. The LVEF is normal with 3+ MR. PA 42/10 with CVP 10.  Past Medical History  Diagnosis Date  . Diastolic CHF, chronic (HCC)     a. EF 55-60% by echo 2007  . Hypertension   . Coronary artery disease     a. s/p MI & CABG; b. s/p PCI Diag;  c. Cath 12/2011: LAD diff dzs/small, Diag 75% isr, 90 dist to stent (small), LCX & RCA occluded,  VG->OM 40, VG->PDA patent - med rx.  . History of transient ischemic attack (TIA)     CAROTID DOPPLERS NOV 2011  0-39& BIL. STENOSIS  . Hyperlipidemia   . Allergic rhinitis   . History of diverticulitis of colon     5 YRS AGO  . Diabetes mellitus     ORAL MEDS  . 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 Anxiety   . Depression   . Neuromuscular disorder ([place])     left arm numbness   . GERD (gastroesophageal reflux disease)   . Cancer ([place])     hx of skin cancers   . Sleep apnea     cpap- see ov note in [facility] 05/12/11 for settings   . Myocardial infarction [facility])     Past Surgical History  Procedure Laterality Date  . Right knee ed compartment replacement  2004  . Laparoscopic gastric banding  01-15-09  . Knee arthroscopy w/ meniscectomy  X2 IN 2002-- RIGHT KNEE  . Umbilical hernia repair  01-15-09    W/ GASTRIC BANDING PROCEDURE  . Coronary artery bypass graft  1995    X3 VESSEL  . Blepharoplasty  1985    BILATERAL  . Circumcision  35 YRS  AGO  . Knee arthroscopy  07/22/2011    Procedure: ARTHROSCOPY KNEE;  Surgeon: [name];  Location: [place];  Service: Orthopedics;  Laterality: Right;  WITH DEBRIDEMENT   . Chondroplasty  07/22/2011    Procedure: CHONDROPLASTY;  Surgeon: [name];  Location: [place];  Service: Orthopedics;;  . Synovectomy  07/22/2011    Procedure: SYNOVECTOMY;  Surgeon: Dione Plover Aluisio;  Location: [place];  Service: Orthopedics;;  . Coronary angioplasty  1996-- POST CABG    W/ STENT, last cath 01/07/2012   . Coronary stent placement    . Cardiac catheterization  2001, 2004, 2009, 2011  . Left heart catheterization with coronary angiogram N/A 09/11/2013    Procedure: LEFT HEART CATHETERIZATION WITH CORONARY ANGIOGRAM;  Surgeon: [name], MD;  Location: [facility] CATH LAB;  Service: Cardiovascular;  Laterality: N/A;  . Cardiac catheterization  N/A 01/23/2016    Procedure: Right/Left Heart Cath and Coronary Angiography;  Surgeon: [name], MD;  Location: [place] CV LAB;  Service: Cardiovascular;  Laterality: N/A;  . Tee without cardioversion N/A 01/23/2016    Procedure: TRANSESOPHAGEAL ECHOCARDIOGRAM (TEE);  Surgeon: [name], MD;  Location: [facility] ENDOSCOPY;  Service: Cardiovascular;  Laterality: N/A;    Family History  Problem Relation Age of Onset  . Heart disease Sister     CABG  . Stroke Sister   . Other Mother     joint problems  . Hypertension Sister   . Congestive Heart Failure Sister   . Diabetes Sister   . Heart disease Brother   . Hypertension Brother   . Congestive Heart Failure Brother   . Hypertension Father   . Heart disease Father   . CVA Father   . Depression Father     Social History Social History  Substance Use Topics  . Smoking status: Never Smoker   . Smokeless tobacco: Never Used  . Alcohol Use: Yes     Comment: OCCASIONAL    Current Outpatient Prescriptions  Medication Sig Dispense Refill  . aspirin EC 81 MG tablet Take 81 mg by mouth daily.    Marland Kitchen b complex vitamins tablet Take 1 tablet by mouth daily.    . carvedilol (COREG) 6.25 MG tablet TAKE ONE TABLET BY MOUTH TWICE DAILY 60 tablet 2  . cetirizine (ZYRTEC) 10 MG chewable tablet Chew 10 mg by mouth daily.    . fluticasone (FLONASE) 50 MCG/ACT nasal spray Place 1 spray into both nostrils daily as needed for allergies or rhinitis.    . furosemide (LASIX) 40 MG tablet Take 1 tablet (40mg ) by mouth in the morning and 1/2 tablet (20mg ) about 4PM (Patient taking differently: Take 20-40 mg by mouth 2 (two) times daily. Take 1 tablet (40mg ) by mouth in the morning and 1/2 tablet (20mg ) about 4PM) 135 tablet 1  . HYDROcodone-acetaminophen (NORCO) 10-325 MG tablet Take 1 tablet by mouth 3 (three) times daily as needed for moderate pain.    . metFORMIN (GLUCOPHAGE) 500 MG tablet Take 500 mg by mouth 2 (two) times daily with a meal.    .  Multiple Vitamins-Minerals (CENTRUM ADULTS PO) Take 1 tablet by mouth daily.    . nitroGLYCERIN (NITROSTAT) 0.4 MG SL tablet Place 1 tablet (0.4 mg total) under the tongue every 5 (five) minutes as needed for chest pain. 25 tablet 3  . PARoxetine (PAXIL) 40 MG tablet Take 20 mg by mouth daily as needed (For depression.).     Marland Kitchen potassium chloride SA (K-DUR,KLOR-CON) 20 MEQ tablet Take 1 tablet (20 mEq total) by mouth 2 (  two) times daily. (Patient taking differently: Take 20 mEq by mouth daily. ) 180 tablet 1  . rosuvastatin (CRESTOR) 10 MG tablet Take 10 mg by mouth daily.    . tadalafil (CIALIS) 20 MG tablet Take 20 mg by mouth daily as needed for erectile dysfunction.    . tamsulosin (FLOMAX) 0.4 MG CAPS capsule Take 0.4 mg by mouth daily.    Marland Kitchen testosterone cypionate (DEPOTESTOSTERONE CYPIONATE) 200 MG/ML injection Inject 100 mg into the muscle every Tuesday.     . traZODone (DESYREL) 100 MG tablet Take 100 mg by mouth at bedtime as needed for sleep.    . VENTOLIN HFA 108 (90 Base) MCG/ACT inhaler Inhale 1 puff into the lungs every 4 (four) hours as needed for wheezing or shortness of breath.     . mirtazapine (REMERON) 15 MG tablet Take 15 mg by mouth at bedtime as needed (For sleep.).      No current facility-administered medications for this visit.    Allergies  Allergen Reactions  . Codeine Itching and Other (See Comments)    Extremity tingling-- can take synthetic    Review of Systems  Constitutional: Positive for activity change and fatigue. Negative for fever, chills and appetite change.       Weight gain  HENT: Negative.   Eyes: Negative.   Respiratory: Positive for chest tightness.        Exertional shortness of breath  Cardiovascular: Positive for chest pain and leg swelling.  Gastrointestinal: Negative.   Endocrine: Negative.   Genitourinary: Negative.   Musculoskeletal: Negative.   Skin: Negative.   Allergic/Immunologic: Negative.   Neurological: Negative.     Hematological: Negative.   Psychiatric/Behavioral: Negative.     BP 120/73 mmHg  Pulse 74  Resp 16  Ht 5\' 9"  (1.753 m)  Wt 215 lb (97.523 kg)  BMI 31.74 kg/m2  SpO2 96% Physical Exam  Constitutional: He is oriented to person, place, and time. He appears well-developed and well-nourished. No distress.  HENT:  Head: Normocephalic and atraumatic.  Mouth/Throat: Oropharynx is clear and moist.  Eyes: EOM are normal. Pupils are equal, round, and reactive to light.  Neck: Normal range of motion. Neck supple. No JVD present. No thyromegaly present.  Cardiovascular: Normal rate, regular rhythm and intact distal pulses.   Murmur heard. 3/6 systolic murmur at apex to axilla.  Pulmonary/Chest: Effort normal and breath sounds normal. No respiratory distress. He has no wheezes. He has no rales.  Old sternotomy scar  Abdominal: Soft. Bowel sounds are normal. He exhibits no distension and no mass. There is no tenderness.  Musculoskeletal: Normal range of motion. He exhibits no edema or tenderness.  Lymphadenopathy:    He has no cervical adenopathy.  Neurological: He is alert and oriented to person, place, and time. He has normal strength. No cranial nerve deficit or sensory deficit.  Skin: Skin is warm and dry.  Psychiatric: He has a normal mood and affect.     Diagnostic Tests:  Nuclear Stress:  Study Highlights     Nuclear stress EF: 47%.  There was no ST segment deviation noted during stress.  The left ventricular ejection fraction is mildly decreased (45-54%).  This is a low risk study.  Low risk stress nuclear study with moderate size, severe intensity, partially reversible inferior and apical defect; findings consistent with prior infarct vs thinning and very mild ischemia towards the apex; EF 47 with global hyokinesis.     [facility]  [address]  [place], [place] [address]   [phone]  ------------------------------------------------------------------- Echocardiography  Patient: Fin, Bachus MR #: [id number] Study Date: 01/08/2016 Gender: M Age: 62 Height: 177.8 cm Weight: 101.6 kg BSA: 2.27 m^2 Pt. Status: Room:  SONOGRAPHER Blondell Reveal ATTENDING [name], M.D. ORDERING [name], M.D. REFERRING [name], M.D. PERFORMING [facility], Outpatient  cc:  -------------------------------------------------------------------  ------------------------------------------------------------------- Indications: (R06.02).  ------------------------------------------------------------------- History: PMH: LE edema. Acquired from the patient and from the patient&'s chart. Dyspnea. Coronary artery disease. Congestive heart failure. Risk factors: Hypertension. Diabetes mellitus. Obese. Dyslipidemia.  ------------------------------------------------------------------- Study Conclusions  - Left ventricle: Wall thickness was increased in a pattern of  moderate LVH. Left ventricular diastolic function parameters were  normal. - Mitral valve: Restricted posterior leaflet motion Variable  eccentric MR ? mild to moderate. Consider TEE to further assess. - Left atrium: The atrium was moderately dilated. - Atrial septum: No defect or patent foramen ovale was identified. - Pulmonary arteries: PA peak pressure: 37 mm Hg (S).  ------------------------------------------------------------------- Labs, prior tests, procedures, and surgery: Coronary artery bypass grafting.  Echocardiography. M-mode, complete 2D, spectral Doppler, and color Doppler. Birthdate: Patient birthdate: Nov 08, 1945. Age: Patient is 70 yr old. Sex: Gender: male. BMI: 32.1 kg/m^2. Blood pressure: 114/70 Patient status: Outpatient. Study date: Study date: 01/08/2016. Study time: 07:35  AM. Location: [facility]  -------------------------------------------------------------------  ------------------------------------------------------------------- Left ventricle: Wall thickness was increased in a pattern of moderate LVH. The transmitral flow pattern was normal. The deceleration time of the early transmitral flow velocity was normal. The pulmonary vein flow pattern was normal. The tissue Doppler parameters were normal. Left ventricular diastolic function parameters were normal.  ------------------------------------------------------------------- Aortic valve: Sclerosis without stenosis.  ------------------------------------------------------------------- Aorta: Ascending aorta: The ascending aorta was mildly dilated.  ------------------------------------------------------------------- Mitral valve: Restricted posterior leaflet motion Variable eccentric MR ? mild to moderate. Consider TEE to further assess. Doppler: Peak gradient (D): 4 mm Hg.  ------------------------------------------------------------------- Left atrium: The atrium was moderately dilated.  ------------------------------------------------------------------- Atrial septum: No defect or patent foramen ovale was identified.  ------------------------------------------------------------------- Right ventricle: The cavity size was normal. Wall thickness was normal. Systolic function was normal.  ------------------------------------------------------------------- Pulmonic valve: Doppler: There was mild regurgitation.  ------------------------------------------------------------------- Tricuspid valve: Doppler: There was mild regurgitation.  ------------------------------------------------------------------- Right atrium: The atrium was normal in size.  ------------------------------------------------------------------- Pericardium: The pericardium was  normal in appearance.  ------------------------------------------------------------------- Measurements  Left ventricle Value Reference LV ID, ED, PLAX chordal 51.9 mm 43 - 52 LV ID, ES, PLAX chordal 36.3 mm 23 - 38 LV fx shortening, PLAX chordal 30 % >=29 LV PW thickness, ED 12.6 mm --------- IVS/LV PW ratio, ED 1.21 <=1.3 Stroke volume, 2D 63 ml --------- Stroke volume/bsa, 2D 28 ml/m^2 ---------  Ventricular septum Value Reference IVS thickness, ED 15.2 mm ---------  LVOT Value Reference LVOT ID, S 22 mm --------- LVOT area 3.8 cm^2 --------- LVOT ID 22 mm --------- LVOT peak velocity, S 102 cm/s --------- LVOT mean velocity, S 58.7 cm/s --------- LVOT VTI, S 16.7 cm --------- LVOT peak gradient, S 4 mm Hg --------- Stroke volume (SV), LVOT DP 63.5 ml --------- Stroke index (SV/bsa), LVOT DP 28 ml/m^2 ---------  Aorta Value Reference Aortic root ID, ED 39 mm --------- Ascending aorta ID, A-P, S 41 mm ---------  Left atrium Value Reference LA ID, A-P, ES 49 mm --------- LA ID/bsa, A-P 2.16 cm/m^2 <=2.2 LA volume, S 116 ml --------- LA volume/bsa, S 51.1 ml/m^2 --------- LA volume, ES, 1-p A4C 106  ml --------- LA volume/bsa, ES,  1-p A4C 46.7 ml/m^2 --------- LA volume, ES, 1-p A2C 126 ml --------- LA volume/bsa, ES, 1-p A2C 55.5 ml/m^2 ---------  Mitral valve Value Reference Mitral E-wave peak velocity 95.8 cm/s --------- Mitral A-wave peak velocity 48.9 cm/s --------- Mitral deceleration time 222 ms 150 - 230 Mitral peak gradient, D 4 mm Hg --------- Mitral E/A ratio, peak 1.8 ---------  Pulmonary arteries Value Reference PA pressure, S, DP (H) 37 mm Hg <=30  Tricuspid valve Value Reference Tricuspid regurg peak velocity 290 cm/s --------- Tricuspid peak RV-RA gradient 34 mm Hg ---------  Systemic veins Value Reference Estimated CVP 3 mm Hg ---------  Right ventricle Value Reference RV pressure, S, DP (H) 37 mm Hg <=30  Legend: (L) and (H) mark values outside specified reference range.  ------------------------------------------------------------------- Prepared and Electronically Authenticated by  Jenkins Rouge, M.D. 2017-05-17T14:56:39   TEE:  CV Procedure by [name], MD at 01/23/2016 8:43 AM    Author: [name], MD Service: TEE Author Type: Physician   Filed: 01/23/2016 8:48 AM Note Time: 01/23/2016 8:43 AM Status: Signed   Editor: [name], MD (Physician)     Expand All Collapse All   Procedure: TEE  Indication: Mitral regurgitation  Sedation: Versed 2 mg IV, Fentanyl 25 mcg IV  Findings: Please see echo section for full report. Normal LV size with moderate LV hypertrophy. EF 55-60%. Normal wall motion. Mildly  dilated RV with normal systolic function. Peak RV-RA gradient 45 mmHg. Mild TR. Trileaflet aortic valve with no stenosis or regurgitation. Mild-moderately dilated LA with no LAA thrombus. Normal RA. Small PFO. There was eccentric, posteriorly-directed MR. Due to eccentricity, unable to measure PISA. Suspect severe MR, wraps around left atrium. However, there was no systolic flow reversal in the pulmonary venous doppler pattern. There was a small area of prolapse of a portion of the anterior leaflet. Normal caliber aorta with mild plaque.   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   [name] 01/23/2016       [name], MD (Primary)      Procedures    Right/Left Heart Cath and Coronary Angiography    Conclusion    1. No significant change compared to prior cath in 2015. The grafts are patent. There is significant disease in a large diagonal system that is similar in caliber to the continuation of the LAD. This disease is not amenable to intervention (90% distal diagonal stenosis, small caliber at that point).  2. Mildly elevated left and right heart filling pressures.  3. Normal EF with 3+ MR. Suspect low cardiac output reading by Fick is not accurate.   I will hydrate today but increase Lasix to 40 mg po bid starting tomorrow.   Will refer to Dr Cyndia Bent for surgical evaluation given suspected severe MR with symptoms. ?MV clip candidate.     Technique and Indications    Procedure: Right Heart Cath, Left Heart Cath, Selective Coronary Angiography, LV angiography  Indication: Mitral regurgitation, abnormal stress test, exertional dyspnea.   Procedural Details: The right brachial and radial areas were prepped, draped, and anesthetized with 1% lidocaine. There was a pre-existing peripheral IV in the right brachial area that was replaced with a 865F venous sheath. A Swan-Ganz catheter was used for the right heart catheterization. Standard protocol was followed  for recording of right heart pressures and sampling of oxygen saturations. Fick cardiac output was calculated. The right radial artery was entered using modified Seldinger technique and a 65F sheath was placed. JR4  was used to engage the SVG-OM, RCB was used to engage SVG-PDA, JL3.5 was used to engage left main. RCA known to be occluded proximally. There were no immediate procedural complications. The patient was transferred to the post catheterization recovery area for further monitoring.  During this procedure the patient is administered a total of Versed 2 mg and Fentanyl 50 mg to achieve and maintain moderate conscious sedation. The patient's heart rate, blood pressure, and oxygen saturation are monitored continuously during the procedure. The period of conscious sedation is 60 minutes, of which I was present face-to-face 100% of this time. Estimated blood loss <50 mL.    Coronary Findings    Dominance: Right   Left Main  20% distal tapering.     Left Anterior Descending  The LAD gives off a large, branching 1st diagonal that is comparable in size to the continuation of the LAD. The LAD beyond D1 has luminal irregularities. D1 has a stent just after the take-off of a proximal branch. There is 80-90% in-stent restenosis in the proximal portion of the stent. Beyond the stent in the distal D1, there is a 90% stenosis. The vessel is small caliber at this point. The proximal branch off the D1 has a 60-70% ostial stenosis. These findings are similar to the prior study.     Left Circumflex  Totally occluded proximally. SVG-OM is patent with 40% proximal and 40% distal stenoses.     Right Coronary Artery  Not injected, known to be occluded. SVG-PDA is patent with 30% proximal stenosis. PDA does not appear to have significant disease.       Right Heart Pressures RHC Procedural Findings: Hemodynamics (mmHg) RA 10 RV 43/12 PA 42/10, mean 17 Unable to advance PA catheter far enough to wedge  given small caliber brachial vein.  LV 115/20 AO 116/74  Oxygen saturations: PA 57% AO 98%  Cardiac Output (Fick) 3.55  Cardiac Index (Fick) 1.62 I suspect that these Fick numbers are not accurate. LV EF appears within normal range.    Wall Motion    EF 55-60%, 3+ MR.         Impression:  The patient has a several month history of progressive exertional fatigue and shortness of breath associated with chest discomfort. His cath shows continued patency of his bypass grafts with some disease in a significant but small caliber diagonal. I don't think there is any reconstructable coronary disease there and his nuclear stress only showed inferior infarct with mild peri-infarct ischemia probably related to old RCA occlusion with a patent PDA vein graft. He does have significant MR that is felt to be probably severe by TEE and is a change compared to his prior echo in 08/2012 when he had only mild MR. This certainly could account for his symptoms and weight gain/leg edema. He has had prior CABG and if mitral valve repair was to be done a minimally invasive approach may be best. I have recommended that we have him see Dr. Roxy Manns to see what he thinks about the degree of MR and whether a minimally invasive mitral valve repair is an option. The patient and his wife are very concerned about his degree of symptoms since they are really limiting his lifestyle and activity level. They would like to pursue further evaluation.  Plan:  He will return to see Dr. Roxy Manns for evaluation for minimally invasive mitral valve repair.   Gaye Pollack, MD [facility] [phone]

## 2016-02-07 DIAGNOSIS — L570 Actinic keratosis: Secondary | ICD-10-CM | POA: Diagnosis not present

## 2016-02-07 DIAGNOSIS — L814 Other melanin hyperpigmentation: Secondary | ICD-10-CM | POA: Diagnosis not present

## 2016-02-07 DIAGNOSIS — L821 Other seborrheic keratosis: Secondary | ICD-10-CM | POA: Diagnosis not present

## 2016-02-07 DIAGNOSIS — Z85828 Personal history of other malignant neoplasm of skin: Secondary | ICD-10-CM | POA: Diagnosis not present

## 2016-02-07 DIAGNOSIS — L723 Sebaceous cyst: Secondary | ICD-10-CM | POA: Diagnosis not present

## 2016-02-07 DIAGNOSIS — D485 Neoplasm of uncertain behavior of skin: Secondary | ICD-10-CM | POA: Diagnosis not present

## 2016-02-07 DIAGNOSIS — C44622 Squamous cell carcinoma of skin of right upper limb, including shoulder: Secondary | ICD-10-CM | POA: Diagnosis not present

## 2016-02-12 ENCOUNTER — Encounter: Payer: Self-pay | Admitting: Thoracic Surgery (Cardiothoracic Vascular Surgery)

## 2016-02-12 DIAGNOSIS — I340 Nonrheumatic mitral (valve) insufficiency: Secondary | ICD-10-CM | POA: Insufficient documentation

## 2016-02-13 ENCOUNTER — Encounter: Payer: Self-pay | Admitting: Thoracic Surgery (Cardiothoracic Vascular Surgery)

## 2016-02-13 ENCOUNTER — Institutional Professional Consult (permissible substitution) (INDEPENDENT_AMBULATORY_CARE_PROVIDER_SITE_OTHER): Payer: Medicare Other | Admitting: Thoracic Surgery (Cardiothoracic Vascular Surgery)

## 2016-02-13 VITALS — BP 130/70 | HR 71 | Resp 20 | Ht 69.0 in | Wt 215.0 lb

## 2016-02-13 DIAGNOSIS — E119 Type 2 diabetes mellitus without complications: Secondary | ICD-10-CM | POA: Diagnosis not present

## 2016-02-13 DIAGNOSIS — H02403 Unspecified ptosis of bilateral eyelids: Secondary | ICD-10-CM | POA: Diagnosis not present

## 2016-02-13 DIAGNOSIS — I340 Nonrheumatic mitral (valve) insufficiency: Secondary | ICD-10-CM

## 2016-02-13 DIAGNOSIS — I5032 Chronic diastolic (congestive) heart failure: Secondary | ICD-10-CM | POA: Diagnosis not present

## 2016-02-13 DIAGNOSIS — Z961 Presence of intraocular lens: Secondary | ICD-10-CM | POA: Diagnosis not present

## 2016-02-13 MED ORDER — AMIODARONE HCL 200 MG PO TABS: 200.0000 mg | ORAL_TABLET | Freq: Two times a day (BID) | ORAL | Status: DC

## 2016-02-13 NOTE — Progress Notes (Signed)
[address]       [place] [address]             [facility] REPORT  Referring Provider is Gaye Pollack, MD  Primary Cardiologist is [name], MD PCP is  Melinda Crutch, MD  Chief Complaint  Patient presents with  . Mitral Regurgitation    Surgical evaluation for minimally invasive mitral valve repair, saw Dr Cyndia Bent 02/03/16    HPI:  Patient is a 70 year old male with history of coronary artery disease status post coronary artery bypass grafting by Dr. Cyndia Bent in 1995, hypertension, chronic diastolic congestive heart failure, hyperlipidemia, type 2 diabetes mellitus, and obstructive sleep apnea who has been referred for a second surgical opinion to discuss treatment options for management of severe symptomatic mitral regurgitation.  The patient underwent coronary artery bypass grafting in 1995.  Although details of the procedure are not currently available, grafts placed at the time of surgery included saphenous vein graft to the obtuse marginal branch and saphenous vein graft to the posterior descending coronary artery.  He states that he recovered uneventfully.  Since then he has done well from a cardiac standpoint until recently.  He was followed for many years by Dr. Maurene Capes, and more recently he has been followed by Dr. Aundra Dubin.  Over the past year the patient complains of progressive symptoms of exertional shortness of breath and fatigue. Symptoms have gotten considerably worse over the past 6-8 weeks, and the patient has began to experience significant fluid retention. He was seen in follow-up by Dr. Aundra Dubin in early May and his dose of Lasix was doubled. He underwent nuclear stress test 12/03/2015 that revealed previous inferior wall infarction with mild peri-infarct ischemia and resting ejection fraction estimated 47%. There were no ST segment changes noted during stress. Transthoracic echocardiogram performed  01/08/2016 revealed normal left ventricular systolic function with at least moderate mitral regurgitation that was eccentric and directed posteriorly. The patient subsequently underwent transesophageal echocardiogram on 01/23/2016 by Dr. Aundra Dubin. Ejection fraction was estimated 55-60%. There was severe mitral regurgitation with an eccentric, posteriorly directed jet. There was no flow reversal in the pulmonary veins. The posterior leaflet appeared somewhat restricted and there may be some associated prolapse of the anterior leaflet. Follow-up catheterization performed 01/23/2016 revealed continued patency of vein graft to the posterior descending coronary artery and the obtuse marginal branch of the left circumflex coronary artery. There is high-grade stenosis in the distal segment of a diagonal branch that was unchanged from previous catheterizations. The left anterior descending coronary artery was relatively small and free of disease with exception of a discrete 90% lesion in the very distal portion of the vessel. Pulmonary artery pressures were mild to moderately elevated. The patient was referred for surgical consultation and previously evaluated by Dr. Cyndia Bent. The patient has been referred for a second surgical opinion to discuss the possibility of minimally invasive approach for mitral valve repair.  The patient is married and lives locally in [place] with his wife.  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 In the past he made an effort to get some type of exercise on a regular basis. He states that oe e pt 6 months or more he has not been able to exercise at all because of exertional shortness of breath and fatigue.  He  now gets short of breath and tired with moderate level activity. He denies any history of resting shortness of breath, PND, or orthopnea. He has had some lower extremity edema that  has improved with increased diuretic therapy. However, despite the increased dose of Lasix he feels as though his exercise tolerance continues to deteriorate. He has not had any chest pain or chest tightness either with activity or rest. He has had occasional palpitations without dizzy spells or syncope.  Past Medical History  Diagnosis Date  . Diastolic CHF, chronic (HCC)     a. EF 55-60% by echo 2007  . Hypertension   . Coronary artery disease     a. s/p MI & CABG; b. s/p PCI Diag;  c. Cath 12/2011: LAD diff dzs/small, Diag 75% isr, 90 dist to stent (small), LCX & RCA occluded, VG->OM 40, VG->PDA patent - med rx.  . History of transient ischemic attack (TIA)     CAROTID DOPPLERS NOV 2011  0-39& BIL. STENOSIS  . Hyperlipidemia   . Allergic rhinitis   . History of diverticulitis of colon     5 YRS AGO  . Diabetes mellitus     ORAL MEDS  . 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 Anxiety   . Depression   . Neuromuscular disorder ([place])     left arm numbness   . GERD (gastroesophageal reflux disease)   . Cancer ([place])     hx of skin cancers   . Sleep apnea     cpap- see ov note in [facility] 05/12/11 for settings   . Myocardial infarction ([place])   . Mitral regurgitation     Past Surgical History  Procedure Laterality Date  . Right knee ed compartment replacement  2004  . Laparoscopic gastric banding  01-15-09  . Knee arthroscopy w/ meniscectomy  X2 IN 2002-- RIGHT KNEE  . Umbilical hernia repair  01-15-09    W/ GASTRIC BANDING PROCEDURE  . Coronary artery bypass graft  1995    X3 VESSEL  . Blepharoplasty  1985    BILATERAL  . Circumcision  35 YRS  AGO  . Knee arthroscopy  07/22/2011    Procedure: ARTHROSCOPY KNEE;  Surgeon: [name];  Location: [facility];  Service: Orthopedics;  Laterality: Right;  WITH DEBRIDEMENT   . Chondroplasty  07/22/2011    Procedure: CHONDROPLASTY;  Surgeon: [name];  Location:  [facility];  Service: Orthopedics;;  . Synovectomy  07/22/2011    Procedure: SYNOVECTOMY;  Surgeon: Dione Plover Aluisio;  Location: [facility];  Service: Orthopedics;;  . Coronary angioplasty  1996-- POST CABG    W/ STENT, last cath 01/07/2012   . Coronary stent placement    . Cardiac catheterization  2001, 2004, 2009, 2011  . Left heart catheterization with coronary angiogram N/A 09/11/2013    Procedure: LEFT HEART CATHETERIZATION WITH CORONARY ANGIOGRAM;  Surgeon: [name], MD;  Location: [facility] CATH LAB;  Service: Cardiovascular;  Laterality: N/A;  . Cardiac catheterization N/A 01/23/2016    Procedure: Right/Left Heart Cath and Coronary Angiography;  Surgeon: [name], MD;  Location: [facility] CV LAB;  Service: Cardiovascular;  Laterality: N/A;  . Tee without cardioversion N/A 01/23/2016    Procedure: TRANSESOPHAGEAL ECHOCARDIOGRAM (TEE);  Surgeon: [name], MD;  Location: [facility] ENDOSCOPY;  Service: Cardiovascular;  Laterality: N/A;    Family History  Problem Relation Age of  Onset  . Heart disease Sister     CABG  . Stroke Sister   . Other Mother     joint problems  . Hypertension Sister   . Congestive Heart Failure Sister   . Diabetes Sister   . Heart disease Brother   . Hypertension Brother   . Congestive Heart Failure Brother   . Hypertension Father   . Heart disease Father   . CVA Father   . Depression Father     Social History   Social History  . Marital Status: Married    Spouse Name: N/A  . Number of Children: N/A  . Years of Education: N/A   Occupational History  . Not on file.   Social History Main Topics  . Smoking status: Never Smoker   . Smokeless tobacco: Never Used  . Alcohol Use: Yes     Comment: OCCASIONAL  . Drug Use: No  . Sexual Activity: Not on file   Other Topics Concern  . Not on file   Social History Narrative    Current Outpatient Prescriptions  Medication Sig Dispense Refill  . aspirin EC 81  MG tablet Take 81 mg by mouth daily.    Marland Kitchen b complex vitamins tablet Take 1 tablet by mouth daily.    . carvedilol (COREG) 6.25 MG tablet TAKE ONE TABLET BY MOUTH TWICE DAILY 60 tablet 2  . cetirizine (ZYRTEC) 10 MG chewable tablet Chew 10 mg by mouth daily.    . fluticasone (FLONASE) 50 MCG/ACT nasal spray Place 1 spray into both nostrils daily as needed for allergies or rhinitis.    . furosemide (LASIX) 40 MG tablet Take 1 tablet (40mg ) by mouth in the morning and 1/2 tablet (20mg ) about 4PM (Patient taking differently: Take 20-40 mg by mouth 2 (two) times daily. Take 1 tablet (40mg ) by mouth in the morning and 1/2 tablet (20mg ) about 4PM) 135 tablet 1  . HYDROcodone-acetaminophen (NORCO) 10-325 MG tablet Take 1 tablet by mouth 3 (three) times daily as needed for moderate pain.    . metFORMIN (GLUCOPHAGE) 500 MG tablet Take 500 mg by mouth 2 (two) times daily with a meal.    . mirtazapine (REMERON) 15 MG tablet Take 15 mg by mouth at bedtime as needed (For sleep.).     Marland Kitchen Multiple Vitamins-Minerals (CENTRUM ADULTS PO) Take 1 tablet by mouth daily.    . nitroGLYCERIN (NITROSTAT) 0.4 MG SL tablet Place 1 tablet (0.4 mg total) under the tongue every 5 (five) minutes as needed for chest pain. 25 tablet 3  . PARoxetine (PAXIL) 40 MG tablet Take 20 mg by mouth daily as needed (For depression.).     Marland Kitchen potassium chloride SA (K-DUR,KLOR-CON) 20 MEQ tablet Take 1 tablet (20 mEq total) by mouth 2 (two) times daily. (Patient taking differently: Take 20 mEq by mouth daily. ) 180 tablet 1  . rosuvastatin (CRESTOR) 10 MG tablet Take 10 mg by mouth daily.    . tadalafil (CIALIS) 20 MG tablet Take 20 mg by mouth daily as needed for erectile dysfunction.    . tamsulosin (FLOMAX) 0.4 MG CAPS capsule Take 0.4 mg by mouth daily.    Marland Kitchen testosterone cypionate (DEPOTESTOSTERONE CYPIONATE) 200 MG/ML injection Inject 100 mg into the muscle every Tuesday.     . traZODone (DESYREL) 100 MG tablet Take 100 mg by mouth at bedtime  as needed for sleep.    . VENTOLIN HFA 108 (90 Base) MCG/ACT inhaler Inhale 1 puff into the lungs every  4 (four) hours as needed for wheezing or shortness of breath.      No current facility-administered medications for this visit.    Allergies  Allergen Reactions  . Codeine Itching and Other (See Comments)    Extremity tingling-- can take synthetic      Review of Systems:   General:  normal appetite, decreased energy, + weight gain, no weight loss, no fever  Cardiac:  no chest pain with exertion, no chest pain at rest, +SOB with exertion, no resting SOB, no PND, no orthopnea, + palpitations, no arrhythmia, no atrial fibrillation, + LE edema, no dizzy spells, no syncope  Respiratory:  + shortness of breath, no home oxygen, + productive cough, + dry cough, + bronchitis, + wheezing, no hemoptysis, no asthma, no pain with inspiration or cough, + sleep apnea, + CPAP at night  GI:   no difficulty swallowing, no reflux, no frequent heartburn, no hiatal hernia, no abdominal pain, no constipation, no diarrhea, no hematochezia, no hematemesis, no melena  GU:   no dysuria,  + frequency, no urinary tract infection, no hematuria, no enlarged prostate, no kidney stones, no kidney disease  Vascular:  no pain suggestive of claudication, + pain in feet, no leg cramps, no varicose veins, no DVT, no non-healing foot ulcer  Neuro:   no stroke, no TIA's, no seizures, no headaches, no temporary blindness one eye,  no slurred speech, no peripheral neuropathy, no chronic pain, no instability of gait, no memory/cognitive dysfunction  Musculoskeletal: no arthritis, no joint swelling, no myalgias, no difficulty walking, normal mobility   Skin:   no rash, no itching, no skin infections, no pressure sores or ulcerations  Psych:   + anxiety, + depression, no nervousness, + unusual recent stress  Eyes:   no blurry vision, no floaters, no recent vision changes, does not wear glasses or contacts  ENT:   no hearing loss,  no loose or painful teeth, no dentures, last saw dentist within the past year  Hematologic:  + easy bruising, no abnormal bleeding, no clotting disorder, no frequent epistaxis  Endocrine:  + diabetes, does occasionally check CBG's at home     Physical Exam:   BP 130/70 mmHg  Pulse 71  Resp 20  Ht 5\' 9"  (1.753 m)  Wt 215 lb (97.523 kg)  BMI 31.74 kg/m2  SpO2 96%  General:  Moderately obese,  well-appearing  HEENT:  Unremarkable   Neck:   no JVD, no bruits, no adenopathy   Chest:   clear to auscultation, symmetrical breath sounds, no wheezes, no rhonchi   CV:   RRR, grade II/VI blowing holosystolic murmur   Abdomen:  soft, non-tender, no masses   Extremities:  warm, well-perfused, pulses palpable, no LE edema  Rectal/GU  Deferred  Neuro:   Grossly non-focal and symmetrical throughout  Skin:   Clean and dry, no rashes, no breakdown   Diagnostic Tests:  NUCLEAR STRESS TEST  Vitals    Height Weight BMI (Calculated)   5\' 10"  (1.778 m) 223 lb 15.8 oz (101.6 kg) 32.2    Study Highlights     Nuclear stress EF: 47%.  There was no ST segment deviation noted during stress.  The left ventricular ejection fraction is mildly decreased (45-54%).  This is a low risk study.  Low risk stress nuclear study with moderate size, severe intensity, partially reversible inferior and apical defect; findings consistent with prior infarct vs thinning and very mild ischemia towards the apex; EF 47 with global hyokinesis.  Nuclear History and Indications    History and Indications Indication for Stress Test: Evaluation of extent and severity of coronary artery disease History: CAD;'06 MPI;EF=63% and scar Cardiac Risk Factors: Family History - CAD, Hypertension and Lipids  Symptoms: Chest Pain at Rest and with Exertion (last date of chest discomfort yesterday), DOE, Fatigue with Exertion, Palpitations and SOB    Stress Findings    ECG Sunus with ventricular bigemeny, incomplete  RBBB, inferior lateral TWI. Marland Kitchen   Stress Findings A pharmacological stress test was performed using IV Lexiscan 0.4mg  over 10 seconds performed without concurrent submaximal exercise.  The patient reported shortness of breath and fatigue during the stress test.   Test was stopped per protocol.   Response to Stress There was no ST segment deviation noted during stress.  Arrhythmias during stress: frequent PVCs.  Arrhythmias during recovery: frequent PVCs.  Arrhythmias were not significant.  ECG was interpretable and there was no significant change from baseline.    Stress Measurements    Baseline Vitals  Rest HR 63 bpm    Rest BP 105/69 mmHg    Peak Stress Vitals  Peak HR 80 bpm    Peak BP 117/73 mmHg         Nuclear Stress Measurements    LV sys vol 78 mL    TID 1     LV dias vol 148 mL    LHR 0.27     SSS 4     SRS 0     SDS 4            Nuclear Stress Findings    Isotope administration Rest isotope was administered with an IV injection of 10.7 mCi Tc67m Sestamibi. Rest SPECT images were obtained approximately 45 minutes post tracer injection. Stress isotope was administered with an IV injection of 32.8 mCi Tc39m Sestamibi 20 seconds post IV Lexiscan administration. Stress SPECT images were obtained approximately 60 minutes post tracer injection.   Nuclear Measurements Study was gated.   Rest Perfusion There is a defect present in the basal inferior, apical inferior and apex location.   Stress Perfusion There is a defect present in the basal inferior, apical inferior and apex location.   Overall Study Impression Myocardial perfusion is abnormal. This is a low risk study. Overall left ventricular systolic function was abnormal. LV cavity size is mildly enlarged. Nuclear stress EF: 47%. The left ventricular ejection fraction is mildly decreased (45-54%). There are no images available for comparison.  From: ACCF/SCAI/STS/AATS/AHA/ASNC/HFSA/SCCT 2012  Appropriate Use Criteria for Coronary Revascularization Focused Update    Wall Scoring    Score Index: 2.000 Percent Normal: 0.0%          The left ventricle is globally hypokinetic.            Signed    Electronically signed by Lelon Perla, MD on 01/02/16 at 1433 EDT        Echocardiography  Patient: Izear, [place] MR #: [id number] Study Date: 01/08/2016 Gender: M Age: 68 Height: 177.8 cm Weight: 101.6 kg BSA: 2.27 m^2 Pt. Status: Room:  SONOGRAPHER Blondell Reveal ATTENDING [name], M.D. ORDERING [name], M.D. REFERRING [name], M.D. PERFORMING [facility], Outpatient  cc:  -------------------------------------------------------------------  ------------------------------------------------------------------- Indications: (R06.02).  ------------------------------------------------------------------- History: PMH: LE edema. Acquired from the patient and from the patient&'s chart. Dyspnea. Coronary artery disease. Congestive heart failure. Risk factors: Hypertension. Diabetes mellitus. Obese. Dyslipidemia.  ------------------------------------------------------------------- Study Conclusions  - Left ventricle: Wall thickness was increased in a pattern  of  moderate LVH. Left ventricular diastolic function parameters were  normal. - Mitral valve: Restricted posterior leaflet motion Variable  eccentric MR ? mild to moderate. Consider TEE to further assess. - Left atrium: The atrium was moderately dilated. - Atrial septum: No defect or patent foramen ovale was identified. - Pulmonary arteries: PA peak pressure: 37 mm Hg (S).  ------------------------------------------------------------------- Labs, prior tests, procedures, and surgery: Coronary artery bypass grafting.  Echocardiography. M-mode, complete 2D, spectral Doppler, and color Doppler. Birthdate:  Patient birthdate: 06-Nov-1945. Age: Patient is 70 yr old. Sex: Gender: male. BMI: 32.1 kg/m^2. Blood pressure: 114/70 Patient status: Outpatient. Study date: Study date: 01/08/2016. Study time: 07:35 AM. Location: [facility]  -------------------------------------------------------------------  ------------------------------------------------------------------- Left ventricle: Wall thickness was increased in a pattern of moderate LVH. The transmitral flow pattern was normal. The deceleration time of the early transmitral flow velocity was normal. The pulmonary vein flow pattern was normal. The tissue Doppler parameters were normal. Left ventricular diastolic function parameters were normal.  ------------------------------------------------------------------- Aortic valve: Sclerosis without stenosis.  ------------------------------------------------------------------- Aorta: Ascending aorta: The ascending aorta was mildly dilated.  ------------------------------------------------------------------- Mitral valve: Restricted posterior leaflet motion Variable eccentric MR ? mild to moderate. Consider TEE to further assess. Doppler: Peak gradient (D): 4 mm Hg.  ------------------------------------------------------------------- Left atrium: The atrium was moderately dilated.  ------------------------------------------------------------------- Atrial septum: No defect or patent foramen ovale was identified.  ------------------------------------------------------------------- Right ventricle: The cavity size was normal. Wall thickness was normal. Systolic function was normal.  ------------------------------------------------------------------- Pulmonic valve: Doppler: There was mild regurgitation.  ------------------------------------------------------------------- Tricuspid valve: Doppler: There was mild  regurgitation.  ------------------------------------------------------------------- Right atrium: The atrium was normal in size.  ------------------------------------------------------------------- Pericardium: The pericardium was normal in appearance.  ------------------------------------------------------------------- Measurements  Left ventricle Value Reference LV ID, ED, PLAX chordal 51.9 mm 43 - 52 LV ID, ES, PLAX chordal 36.3 mm 23 - 38 LV fx shortening, PLAX chordal 30 % >=29 LV PW thickness, ED 12.6 mm --------- IVS/LV PW ratio, ED 1.21 <=1.3 Stroke volume, 2D 63 ml --------- Stroke volume/bsa, 2D 28 ml/m^2 ---------  Ventricular septum Value Reference IVS thickness, ED 15.2 mm ---------  LVOT Value Reference LVOT ID, S 22 mm --------- LVOT area 3.8 cm^2 --------- LVOT ID 22 mm --------- LVOT peak velocity, S 102 cm/s --------- LVOT mean velocity, S 58.7 cm/s --------- LVOT VTI, S 16.7 cm --------- LVOT peak gradient, S 4 mm Hg --------- Stroke volume (SV), LVOT DP 63.5 ml --------- Stroke index (SV/bsa), LVOT DP 28 ml/m^2 ---------  Aorta Value Reference Aortic root ID, ED 39 mm --------- Ascending aorta ID, A-P, S 41 mm ---------  Left atrium Value Reference LA ID, A-P, ES 49 mm  --------- LA ID/bsa, A-P 2.16 cm/m^2 <=2.2 LA volume, S 116 ml --------- LA volume/bsa, S 51.1 ml/m^2 --------- LA volume, ES, 1-p A4C 106 ml --------- LA volume/bsa, ES, 1-p A4C 46.7 ml/m^2 --------- LA volume, ES, 1-p A2C 126 ml --------- LA volume/bsa, ES, 1-p A2C 55.5 ml/m^2 ---------  Mitral valve Value Reference Mitral E-wave peak velocity 95.8 cm/s --------- Mitral A-wave peak velocity 48.9 cm/s --------- Mitral deceleration time 222 ms 150 - 230 Mitral peak gradient, D 4 mm Hg --------- Mitral E/A ratio, peak 1.8 ---------  Pulmonary arteries Value Reference PA pressure, S, DP (H) 37 mm Hg <=30  Tricuspid valve Value Reference Tricuspid regurg peak velocity 290 cm/s --------- Tricuspid peak RV-RA gradient 34 mm Hg ---------  Systemic veins Value Reference Estimated CVP 3 mm Hg ---------  Right  ventricle Value Reference RV pressure, S, DP (H) 37 mm Hg <=30  Legend: (L) and (H) mark values outside specified reference range.  ------------------------------------------------------------------- Prepared and Electronically Authenticated by  Jenkins Rouge, M.D. 2017-05-17T14:56:39   Procedure: TEE  Indication: Mitral regurgitation  Sedation: Versed 2 mg IV, Fentanyl 25 mcg IV  Findings: Please see echo section for full report. Normal LV size with moderate LV hypertrophy. EF 55-60%. Normal wall motion. Mildly dilated RV with normal systolic function.  Peak RV-RA gradient 45 mmHg. Mild TR. Trileaflet aortic valve with no stenosis or regurgitation. Mild-moderately dilated LA with no LAA thrombus. Normal RA. Small PFO. There was eccentric, posteriorly-directed MR. Due to eccentricity, unable to measure PISA. Suspect severe MR, wraps around left atrium. However, there was no systolic flow reversal in the pulmonary venous doppler pattern. There was a small area of prolapse of a portion of the anterior leaflet. Normal caliber aorta with mild plaque.   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   [name] 01/23/2016   CARDIAC CATHETERIZATION  Procedures    Right/Left Heart Cath and Coronary Angiography    Conclusion    1. No significant change compared to prior cath in 2015. The grafts are patent. There is significant disease in a large diagonal system that is similar in caliber to the continuation of the LAD. This disease is not amenable to intervention (90% distal diagonal stenosis, small caliber at that point).  2. Mildly elevated left and right heart filling pressures.  3. Normal EF with 3+ MR. Suspect low cardiac output reading by Fick is not accurate.   I will hydrate today but increase Lasix to 40 mg po bid starting tomorrow.   Will refer to Dr Cyndia Bent for surgical evaluation given suspected severe MR with symptoms. ?MV clip candidate.     Technique and Indications    Procedure: Right Heart Cath, Left Heart Cath, Selective Coronary Angiography, LV angiography  Indication: Mitral regurgitation, abnormal stress test, exertional dyspnea.   Procedural Details: The right brachial and radial areas were prepped, draped, and anesthetized with 1% lidocaine. There was a pre-existing peripheral IV in the right brachial area that was replaced with a 43F venous sheath. A Swan-Ganz catheter was used for the right heart catheterization. Standard protocol was followed for recording of right heart pressures and sampling of  oxygen saturations. Fick cardiac output was calculated. The right radial artery was entered using modified Seldinger technique and a 5F sheath was placed. JR4 was used to engage the SVG-OM, RCB was used to engage SVG-PDA, JL3.5 was used to engage left main. RCA known to be occluded proximally. There were no immediate procedural complications. The patient was transferred to the post catheterization recovery area for further monitoring.  During this procedure the patient is administered a total of Versed 2 mg and Fentanyl 50 mg to achieve and maintain moderate conscious sedation. The patient's heart rate, blood pressure, and oxygen saturation are monitored continuously during the procedure. The period of conscious sedation is 60 minutes, of which I was present face-to-face 100% of this time. Estimated blood loss <50 mL.    Coronary Findings    Dominance: Right   Left Main  20% distal tapering.     Left Anterior Descending  The LAD gives off a large, branching 1st diagonal that is comparable in size to the continuation of the LAD. The LAD beyond D1 has luminal irregularities. D1 has a stent just after the take-off of a proximal branch. There is 80-90% in-stent restenosis  in the proximal portion of the stent. Beyond the stent in the distal D1, there is a 90% stenosis. The vessel is small caliber at this point. The proximal branch off the D1 has a 60-70% ostial stenosis. These findings are similar to the prior study.     Left Circumflex  Totally occluded proximally. SVG-OM is patent with 40% proximal and 40% distal stenoses.     Right Coronary Artery  Not injected, known to be occluded. SVG-PDA is patent with 30% proximal stenosis. PDA does not appear to have significant disease.       Right Heart Pressures R[facility] Procedural Findings: Hemodynamics (mmHg) RA 10 RV 43/12 PA 42/10, mean 17 Unable to advance PA catheter far enough to wedge given small caliber brachial vein.  LV 115/20 AO  116/74  Oxygen saturations: PA 57% AO 98%  Cardiac Output (Fick) 3.55  Cardiac Index (Fick) 1.62 I suspect that these Fick numbers are not accurate. LV EF appears within normal range.    Wall Motion    EF 55-60%, 3+ MR.             STS Risk Calculator  Procedure    Redo sternotomy for mitral valve repair  Risk of Mortality   2.8% Morbidity or Mortality  23.9% Prolonged LOS   9.1% Short LOS    27.3% Permanent Stroke   2.2% Prolonged Vent Support  16.4% DSW Infection    0.3% Renal Failure    7.9% Reoperation    7.4%   Impression:  Patient has stage D severe symptomatic primary mitral regurgitation. He presents with progressive symptoms of exertional shortness of breath and fatigue consistent with chronic diastolic congestive heart failure, New York Heart Association functional class IIB-III.  I have personally reviewed the patient's recent transthoracic and transesophageal echocardiograms. He has mild left ventricular systolic dysfunction with ejection fraction estimated 50-60% in the setting of severe mitral regurgitation. Transesophageal echocardiogram confirms the presence of severe mitral regurgitation with an eccentric jet directed posteriorly around the left atrium. The posterior leaflet appears partially restricted and there may be some degree of prolapse or overriding of the anterior leaflet. There does not appear to be an extensive amount of calcification in the posterior leaflet or posterior annulus, and it would appear that there remains a fairly high likelihood that his valve should be repairable.  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  There is high-grade stenosis in a small diagonal branch but no significant proximal flow limiting disease in the left anterior descending coronary artery territory.   I would agree that there is no indication for redo coronary artery bypass grafting at this time.  Risks associated with mitral valve repair are somewhat elevated by the patient's age, comorbid medical problems, and previous coronary artery bypass grafting, but risks associated with conventional surgery or certainly not prohibitive. Moreover, risks of surgery might be somewhat less with a minimally invasive approach for surgery.   Plan:  The patient and his wife were counseled at length regarding the indications, risks and potential benefits of mitral valve repair.  The rationale for elective surgery has been explained, including a comparison between surgery and continued medical therapy with close follow-up.  The likelihood of successful and durable valve repair has been discussed with particular reference to the findings of their recent echocardiogram.  Based upon these findings and  previous experience, I have quoted them a greater than 90 percent likelihood of successful valve repair.  In the unlikely event that their valve cannot be successfully repaired, we discussed the possibility of replacing the mitral valve using a mechanical prosthesis with the attendant need for long-term anticoagulation versus the alternative of replacing it using a bioprosthetic tissue valve with its potential for late structural valve deterioration and failure, depending upon the patient's longevity.  The patient specifically requests that if the mitral valve must be replaced that it be done using a bioprosthetic tissue valve.   Alternative surgical approaches have been discussed including a comparison between conventional sternotomy and minimally-invasive techniques.  The relative risks and benefits of each have been reviewed as they pertain to the patient's specific circumstances, and all of their questions have been addressed.  Specific risks potentially related to the minimally-invasive approach were discussed at length,  including but not limited to risk of conversion to full or partial sternotomy, aortic dissection or other major vascular complication, unilateral acute lung injury or pulmonary edema, phrenic nerve dysfunction or paralysis, rib fracture, chronic pain, lung hernia, or lymphocele.  The patient understands and accepts all potential risks of surgery including but not limited to risk of death, stroke or other neurologic complication, myocardial infarction, congestive heart failure, respiratory failure, renal failure, bleeding requiring transfusion and/or reexploration, arrhythmia, infection or other wound complications, pneumonia, pleural and/or pericardial effusion, pulmonary embolus, aortic dissection or other major vascular complication, or delayed complications related to valve repair or replacement including but not limited to structural valve deterioration and failure, thrombosis, embolization, endocarditis, or paravalvular leak.  We tentatively plan to proceed with surgery on Wednesday, 03/25/2016. During the interim period of time the patient will undergo CT angiography of the aorta and iliac vessels to evaluate the feasibility of peripheral arterial cannulation for surgery. The patient has been given a prescription for amiodarone to begin 5 days prior to surgery to decrease his risk of perioperative atrial dysrhythmias. He has already stopped taking trazodone. The patient will return for follow-up on Monday, 03/23/2016 prior to surgery. All of their questions have been answered.   I spent in excess of 90 minutes during the conduct of this office consultation and >50% of this time involved direct face-to-face encounter with the patient for counseling and/or coordination of their care.   Valentina Gu. Roxy Manns, MD 02/13/2016 4:22 PM

## 2016-02-13 NOTE — Patient Instructions (Addendum)
Continue all previous medications without any changes at this time  Begin taking amiodarone 5 days before your surgery  Schedule CT angiogram as soon as practical

## 2016-02-14 DIAGNOSIS — M9903 Segmental and somatic dysfunction of lumbar region: Secondary | ICD-10-CM | POA: Diagnosis not present

## 2016-02-14 DIAGNOSIS — M9902 Segmental and somatic dysfunction of thoracic region: Secondary | ICD-10-CM | POA: Diagnosis not present

## 2016-02-14 DIAGNOSIS — M5386 Other specified dorsopathies, lumbar region: Secondary | ICD-10-CM | POA: Diagnosis not present

## 2016-02-14 DIAGNOSIS — M9904 Segmental and somatic dysfunction of sacral region: Secondary | ICD-10-CM | POA: Diagnosis not present

## 2016-02-14 DIAGNOSIS — M5137 Other intervertebral disc degeneration, lumbosacral region: Secondary | ICD-10-CM | POA: Diagnosis not present

## 2016-02-14 DIAGNOSIS — M5414 Radiculopathy, thoracic region: Secondary | ICD-10-CM | POA: Diagnosis not present

## 2016-02-17 ENCOUNTER — Other Ambulatory Visit: Payer: Self-pay | Admitting: *Deleted

## 2016-02-17 DIAGNOSIS — M9903 Segmental and somatic dysfunction of lumbar region: Secondary | ICD-10-CM | POA: Diagnosis not present

## 2016-02-17 DIAGNOSIS — Z01818 Encounter for other preprocedural examination: Secondary | ICD-10-CM

## 2016-02-17 DIAGNOSIS — I340 Nonrheumatic mitral (valve) insufficiency: Secondary | ICD-10-CM

## 2016-02-17 DIAGNOSIS — M5137 Other intervertebral disc degeneration, lumbosacral region: Secondary | ICD-10-CM | POA: Diagnosis not present

## 2016-02-17 DIAGNOSIS — M5414 Radiculopathy, thoracic region: Secondary | ICD-10-CM | POA: Diagnosis not present

## 2016-02-17 DIAGNOSIS — M5386 Other specified dorsopathies, lumbar region: Secondary | ICD-10-CM | POA: Diagnosis not present

## 2016-02-17 DIAGNOSIS — M9902 Segmental and somatic dysfunction of thoracic region: Secondary | ICD-10-CM | POA: Diagnosis not present

## 2016-02-17 DIAGNOSIS — M9904 Segmental and somatic dysfunction of sacral region: Secondary | ICD-10-CM | POA: Diagnosis not present

## 2016-02-19 DIAGNOSIS — M5137 Other intervertebral disc degeneration, lumbosacral region: Secondary | ICD-10-CM | POA: Diagnosis not present

## 2016-02-19 DIAGNOSIS — M5414 Radiculopathy, thoracic region: Secondary | ICD-10-CM | POA: Diagnosis not present

## 2016-02-19 DIAGNOSIS — M9902 Segmental and somatic dysfunction of thoracic region: Secondary | ICD-10-CM | POA: Diagnosis not present

## 2016-02-19 DIAGNOSIS — M9904 Segmental and somatic dysfunction of sacral region: Secondary | ICD-10-CM | POA: Diagnosis not present

## 2016-02-19 DIAGNOSIS — M5386 Other specified dorsopathies, lumbar region: Secondary | ICD-10-CM | POA: Diagnosis not present

## 2016-02-19 DIAGNOSIS — M9903 Segmental and somatic dysfunction of lumbar region: Secondary | ICD-10-CM | POA: Diagnosis not present

## 2016-02-21 DIAGNOSIS — M9902 Segmental and somatic dysfunction of thoracic region: Secondary | ICD-10-CM | POA: Diagnosis not present

## 2016-02-21 DIAGNOSIS — M5137 Other intervertebral disc degeneration, lumbosacral region: Secondary | ICD-10-CM | POA: Diagnosis not present

## 2016-02-21 DIAGNOSIS — M9904 Segmental and somatic dysfunction of sacral region: Secondary | ICD-10-CM | POA: Diagnosis not present

## 2016-02-21 DIAGNOSIS — M5414 Radiculopathy, thoracic region: Secondary | ICD-10-CM | POA: Diagnosis not present

## 2016-02-21 DIAGNOSIS — M9903 Segmental and somatic dysfunction of lumbar region: Secondary | ICD-10-CM | POA: Diagnosis not present

## 2016-02-21 DIAGNOSIS — M5386 Other specified dorsopathies, lumbar region: Secondary | ICD-10-CM | POA: Diagnosis not present

## 2016-02-26 DIAGNOSIS — M5137 Other intervertebral disc degeneration, lumbosacral region: Secondary | ICD-10-CM | POA: Diagnosis not present

## 2016-02-26 DIAGNOSIS — M9904 Segmental and somatic dysfunction of sacral region: Secondary | ICD-10-CM | POA: Diagnosis not present

## 2016-02-26 DIAGNOSIS — M5414 Radiculopathy, thoracic region: Secondary | ICD-10-CM | POA: Diagnosis not present

## 2016-02-26 DIAGNOSIS — M5386 Other specified dorsopathies, lumbar region: Secondary | ICD-10-CM | POA: Diagnosis not present

## 2016-02-26 DIAGNOSIS — M9903 Segmental and somatic dysfunction of lumbar region: Secondary | ICD-10-CM | POA: Diagnosis not present

## 2016-02-26 DIAGNOSIS — M9902 Segmental and somatic dysfunction of thoracic region: Secondary | ICD-10-CM | POA: Diagnosis not present

## 2016-02-28 DIAGNOSIS — M9903 Segmental and somatic dysfunction of lumbar region: Secondary | ICD-10-CM | POA: Diagnosis not present

## 2016-02-28 DIAGNOSIS — M9902 Segmental and somatic dysfunction of thoracic region: Secondary | ICD-10-CM | POA: Diagnosis not present

## 2016-02-28 DIAGNOSIS — M9904 Segmental and somatic dysfunction of sacral region: Secondary | ICD-10-CM | POA: Diagnosis not present

## 2016-02-28 DIAGNOSIS — M5414 Radiculopathy, thoracic region: Secondary | ICD-10-CM | POA: Diagnosis not present

## 2016-02-28 DIAGNOSIS — M5137 Other intervertebral disc degeneration, lumbosacral region: Secondary | ICD-10-CM | POA: Diagnosis not present

## 2016-02-28 DIAGNOSIS — M5386 Other specified dorsopathies, lumbar region: Secondary | ICD-10-CM | POA: Diagnosis not present

## 2016-03-03 DIAGNOSIS — M9904 Segmental and somatic dysfunction of sacral region: Secondary | ICD-10-CM | POA: Diagnosis not present

## 2016-03-03 DIAGNOSIS — M9903 Segmental and somatic dysfunction of lumbar region: Secondary | ICD-10-CM | POA: Diagnosis not present

## 2016-03-03 DIAGNOSIS — M5386 Other specified dorsopathies, lumbar region: Secondary | ICD-10-CM | POA: Diagnosis not present

## 2016-03-03 DIAGNOSIS — M5414 Radiculopathy, thoracic region: Secondary | ICD-10-CM | POA: Diagnosis not present

## 2016-03-03 DIAGNOSIS — M5137 Other intervertebral disc degeneration, lumbosacral region: Secondary | ICD-10-CM | POA: Diagnosis not present

## 2016-03-03 DIAGNOSIS — M9902 Segmental and somatic dysfunction of thoracic region: Secondary | ICD-10-CM | POA: Diagnosis not present

## 2016-03-05 DIAGNOSIS — M9904 Segmental and somatic dysfunction of sacral region: Secondary | ICD-10-CM | POA: Diagnosis not present

## 2016-03-05 DIAGNOSIS — M5137 Other intervertebral disc degeneration, lumbosacral region: Secondary | ICD-10-CM | POA: Diagnosis not present

## 2016-03-05 DIAGNOSIS — M9902 Segmental and somatic dysfunction of thoracic region: Secondary | ICD-10-CM | POA: Diagnosis not present

## 2016-03-05 DIAGNOSIS — M5386 Other specified dorsopathies, lumbar region: Secondary | ICD-10-CM | POA: Diagnosis not present

## 2016-03-05 DIAGNOSIS — M9903 Segmental and somatic dysfunction of lumbar region: Secondary | ICD-10-CM | POA: Diagnosis not present

## 2016-03-05 DIAGNOSIS — M5414 Radiculopathy, thoracic region: Secondary | ICD-10-CM | POA: Diagnosis not present

## 2016-03-06 DIAGNOSIS — L600 Ingrowing nail: Secondary | ICD-10-CM | POA: Diagnosis not present

## 2016-03-10 ENCOUNTER — Encounter: Payer: Self-pay | Admitting: Podiatry

## 2016-03-10 ENCOUNTER — Ambulatory Visit (INDEPENDENT_AMBULATORY_CARE_PROVIDER_SITE_OTHER): Payer: Medicare Other

## 2016-03-10 ENCOUNTER — Ambulatory Visit (INDEPENDENT_AMBULATORY_CARE_PROVIDER_SITE_OTHER): Payer: Medicare Other | Admitting: Podiatry

## 2016-03-10 ENCOUNTER — Ambulatory Visit: Payer: Medicare Other

## 2016-03-10 VITALS — BP 145/62 | HR 48 | Resp 16 | Ht 69.0 in | Wt 205.0 lb

## 2016-03-10 DIAGNOSIS — M79676 Pain in unspecified toe(s): Secondary | ICD-10-CM | POA: Diagnosis not present

## 2016-03-10 DIAGNOSIS — E559 Vitamin D deficiency, unspecified: Secondary | ICD-10-CM | POA: Insufficient documentation

## 2016-03-10 DIAGNOSIS — L603 Nail dystrophy: Secondary | ICD-10-CM

## 2016-03-10 DIAGNOSIS — E119 Type 2 diabetes mellitus without complications: Secondary | ICD-10-CM | POA: Diagnosis not present

## 2016-03-10 DIAGNOSIS — B351 Tinea unguium: Secondary | ICD-10-CM | POA: Diagnosis not present

## 2016-03-10 DIAGNOSIS — Z79899 Other long term (current) drug therapy: Secondary | ICD-10-CM | POA: Insufficient documentation

## 2016-03-10 DIAGNOSIS — L600 Ingrowing nail: Secondary | ICD-10-CM

## 2016-03-10 DIAGNOSIS — M1710 Unilateral primary osteoarthritis, unspecified knee: Secondary | ICD-10-CM | POA: Insufficient documentation

## 2016-03-10 DIAGNOSIS — I2510 Atherosclerotic heart disease of native coronary artery without angina pectoris: Secondary | ICD-10-CM | POA: Insufficient documentation

## 2016-03-10 DIAGNOSIS — I509 Heart failure, unspecified: Secondary | ICD-10-CM | POA: Insufficient documentation

## 2016-03-10 DIAGNOSIS — M179 Osteoarthritis of knee, unspecified: Secondary | ICD-10-CM | POA: Insufficient documentation

## 2016-03-10 DIAGNOSIS — Z9849 Cataract extraction status, unspecified eye: Secondary | ICD-10-CM | POA: Insufficient documentation

## 2016-03-10 NOTE — Progress Notes (Signed)
   Subjective:    Patient ID: [name], male    DOB: 12/19/1945, 70 y.o.   MRN: [id number]  HPI: He presents today with a chief complaint of pain to the hallux nail right foot. He states this then this way for many months now. He's tried over-the-counter medications to no avail though he does state that he utilizes Neosporin around the nail plantarly and medially. He states it is been bothersome like to have something done however he is about the Mitral valve surgery and did not have any type of minor surgery performed at this point. He states that his diabetes is under good control hemoglobin A1c is a 7.0.    Review of Systems  All other systems reviewed and are negative.      Objective:   Physical Exam: Vital signs are stable he is alert 3 pulses are palpable bilateral. Neurologic sensorium is intact per Semmes-Weinstein monofilament. Deep tendon reflexes are intact. Orthopedic evaluation as result of systems of ankle range of motion without crepitation. There he does have a thickening of the hallux interphalangeal joint of the right foot which is mildly tender on palpation. Cutaneous evaluation does not demonstrates any type of open lesions or wounds that he does have a thick nail plate to the right hallux with mild erythema but no signs of infection. I debrided the nail plate today and thinned it out which made it feel much better.          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  Plan: I debrided his nails for him bilaterally today and I will follow-up with him as needed for his hallux nail right.

## 2016-03-11 DIAGNOSIS — M5386 Other specified dorsopathies, lumbar region: Secondary | ICD-10-CM | POA: Diagnosis not present

## 2016-03-11 DIAGNOSIS — M9902 Segmental and somatic dysfunction of thoracic region: Secondary | ICD-10-CM | POA: Diagnosis not present

## 2016-03-11 DIAGNOSIS — M5414 Radiculopathy, thoracic region: Secondary | ICD-10-CM | POA: Diagnosis not present

## 2016-03-11 DIAGNOSIS — M5137 Other intervertebral disc degeneration, lumbosacral region: Secondary | ICD-10-CM | POA: Diagnosis not present

## 2016-03-11 DIAGNOSIS — M9904 Segmental and somatic dysfunction of sacral region: Secondary | ICD-10-CM | POA: Diagnosis not present

## 2016-03-11 DIAGNOSIS — M9903 Segmental and somatic dysfunction of lumbar region: Secondary | ICD-10-CM | POA: Diagnosis not present

## 2016-03-12 ENCOUNTER — Ambulatory Visit
Admission: RE | Admit: 2016-03-12 | Discharge: 2016-03-12 | Disposition: A | Discharge status: Home / Self Care | Payer: Medicare Other | Source: Ambulatory Visit | Attending: Thoracic Surgery (Cardiothoracic Vascular Surgery) | Admitting: Thoracic Surgery (Cardiothoracic Vascular Surgery)

## 2016-03-12 DIAGNOSIS — Z01818 Encounter for other preprocedural examination: Secondary | ICD-10-CM

## 2016-03-12 DIAGNOSIS — R0602 Shortness of breath: Secondary | ICD-10-CM | POA: Diagnosis not present

## 2016-03-12 DIAGNOSIS — I340 Nonrheumatic mitral (valve) insufficiency: Secondary | ICD-10-CM

## 2016-03-12 MED ORDER — IOPAMIDOL (ISOVUE-370) INJECTION 76%
75.0000 mL | Freq: Once | INTRAVENOUS | Status: AC | PRN
  Administered 2016-03-12: 75 mL via INTRAVENOUS

## 2016-03-16 DIAGNOSIS — H02834 Dermatochalasis of left upper eyelid: Secondary | ICD-10-CM | POA: Diagnosis not present

## 2016-03-16 DIAGNOSIS — H0279 Other degenerative disorders of eyelid and periocular area: Secondary | ICD-10-CM | POA: Diagnosis not present

## 2016-03-16 DIAGNOSIS — H02831 Dermatochalasis of right upper eyelid: Secondary | ICD-10-CM | POA: Diagnosis not present

## 2016-03-16 DIAGNOSIS — H11433 Conjunctival hyperemia, bilateral: Secondary | ICD-10-CM | POA: Diagnosis not present

## 2016-03-18 DIAGNOSIS — M9903 Segmental and somatic dysfunction of lumbar region: Secondary | ICD-10-CM | POA: Diagnosis not present

## 2016-03-18 DIAGNOSIS — M9902 Segmental and somatic dysfunction of thoracic region: Secondary | ICD-10-CM | POA: Diagnosis not present

## 2016-03-18 DIAGNOSIS — M9904 Segmental and somatic dysfunction of sacral region: Secondary | ICD-10-CM | POA: Diagnosis not present

## 2016-03-18 DIAGNOSIS — M5137 Other intervertebral disc degeneration, lumbosacral region: Secondary | ICD-10-CM | POA: Diagnosis not present

## 2016-03-18 DIAGNOSIS — M5386 Other specified dorsopathies, lumbar region: Secondary | ICD-10-CM | POA: Diagnosis not present

## 2016-03-18 DIAGNOSIS — M5414 Radiculopathy, thoracic region: Secondary | ICD-10-CM | POA: Diagnosis not present

## 2016-03-23 ENCOUNTER — Encounter (HOSPITAL_COMMUNITY)
Admission: RE | Admit: 2016-03-23 | Discharge: 2016-03-23 | Disposition: A | Discharge status: Home / Self Care | Payer: Medicare Other | Source: Ambulatory Visit | Attending: Thoracic Surgery (Cardiothoracic Vascular Surgery) | Admitting: Thoracic Surgery (Cardiothoracic Vascular Surgery)

## 2016-03-23 ENCOUNTER — Ambulatory Visit (INDEPENDENT_AMBULATORY_CARE_PROVIDER_SITE_OTHER): Payer: Medicare Other | Admitting: Thoracic Surgery (Cardiothoracic Vascular Surgery)

## 2016-03-23 ENCOUNTER — Encounter (HOSPITAL_COMMUNITY): Payer: Self-pay

## 2016-03-23 ENCOUNTER — Ambulatory Visit (HOSPITAL_COMMUNITY)
Admission: RE | Admit: 2016-03-23 | Discharge: 2016-03-23 | Disposition: A | Discharge status: Home / Self Care | Payer: Medicare Other | Source: Ambulatory Visit | Attending: Thoracic Surgery (Cardiothoracic Vascular Surgery) | Admitting: Thoracic Surgery (Cardiothoracic Vascular Surgery)

## 2016-03-23 ENCOUNTER — Encounter: Payer: Self-pay | Admitting: Thoracic Surgery (Cardiothoracic Vascular Surgery)

## 2016-03-23 ENCOUNTER — Ambulatory Visit (HOSPITAL_BASED_OUTPATIENT_CLINIC_OR_DEPARTMENT_OTHER)
Admission: RE | Admit: 2016-03-23 | Discharge: 2016-03-23 | Disposition: A | Discharge status: Home / Self Care | Payer: Medicare Other | Source: Ambulatory Visit | Attending: Thoracic Surgery (Cardiothoracic Vascular Surgery) | Admitting: Thoracic Surgery (Cardiothoracic Vascular Surgery)

## 2016-03-23 VITALS — BP 116/72 | HR 57 | Resp 20 | Ht 69.0 in | Wt 205.0 lb

## 2016-03-23 DIAGNOSIS — Z7984 Long term (current) use of oral hypoglycemic drugs: Secondary | ICD-10-CM

## 2016-03-23 DIAGNOSIS — I110 Hypertensive heart disease with heart failure: Secondary | ICD-10-CM | POA: Insufficient documentation

## 2016-03-23 DIAGNOSIS — Z79899 Other long term (current) drug therapy: Secondary | ICD-10-CM | POA: Insufficient documentation

## 2016-03-23 DIAGNOSIS — E668 Other obesity: Secondary | ICD-10-CM | POA: Diagnosis present

## 2016-03-23 DIAGNOSIS — I5032 Chronic diastolic (congestive) heart failure: Secondary | ICD-10-CM | POA: Diagnosis not present

## 2016-03-23 DIAGNOSIS — Z6832 Body mass index (BMI) 32.0-32.9, adult: Secondary | ICD-10-CM

## 2016-03-23 DIAGNOSIS — E785 Hyperlipidemia, unspecified: Secondary | ICD-10-CM

## 2016-03-23 DIAGNOSIS — I340 Nonrheumatic mitral (valve) insufficiency: Secondary | ICD-10-CM

## 2016-03-23 DIAGNOSIS — Z01812 Encounter for preprocedural laboratory examination: Secondary | ICD-10-CM

## 2016-03-23 DIAGNOSIS — I511 Rupture of chordae tendineae, not elsewhere classified: Secondary | ICD-10-CM | POA: Diagnosis not present

## 2016-03-23 DIAGNOSIS — Z01810 Encounter for preprocedural cardiovascular examination: Secondary | ICD-10-CM | POA: Insufficient documentation

## 2016-03-23 DIAGNOSIS — Z96651 Presence of right artificial knee joint: Secondary | ICD-10-CM | POA: Diagnosis present

## 2016-03-23 DIAGNOSIS — Z955 Presence of coronary angioplasty implant and graft: Secondary | ICD-10-CM

## 2016-03-23 DIAGNOSIS — G4730 Sleep apnea, unspecified: Secondary | ICD-10-CM | POA: Insufficient documentation

## 2016-03-23 DIAGNOSIS — Z951 Presence of aortocoronary bypass graft: Secondary | ICD-10-CM

## 2016-03-23 DIAGNOSIS — D696 Thrombocytopenia, unspecified: Secondary | ICD-10-CM | POA: Diagnosis not present

## 2016-03-23 DIAGNOSIS — E119 Type 2 diabetes mellitus without complications: Secondary | ICD-10-CM

## 2016-03-23 DIAGNOSIS — I252 Old myocardial infarction: Secondary | ICD-10-CM

## 2016-03-23 DIAGNOSIS — Z01818 Encounter for other preprocedural examination: Secondary | ICD-10-CM

## 2016-03-23 DIAGNOSIS — J939 Pneumothorax, unspecified: Secondary | ICD-10-CM | POA: Diagnosis not present

## 2016-03-23 DIAGNOSIS — I2510 Atherosclerotic heart disease of native coronary artery without angina pectoris: Secondary | ICD-10-CM | POA: Insufficient documentation

## 2016-03-23 DIAGNOSIS — Z885 Allergy status to narcotic agent status: Secondary | ICD-10-CM

## 2016-03-23 DIAGNOSIS — E1151 Type 2 diabetes mellitus with diabetic peripheral angiopathy without gangrene: Secondary | ICD-10-CM | POA: Diagnosis not present

## 2016-03-23 DIAGNOSIS — D62 Acute posthemorrhagic anemia: Secondary | ICD-10-CM | POA: Diagnosis not present

## 2016-03-23 DIAGNOSIS — T8182XA Emphysema (subcutaneous) resulting from a procedure, initial encounter: Secondary | ICD-10-CM | POA: Diagnosis not present

## 2016-03-23 DIAGNOSIS — Q211 Atrial septal defect: Secondary | ICD-10-CM | POA: Diagnosis not present

## 2016-03-23 DIAGNOSIS — E876 Hypokalemia: Secondary | ICD-10-CM | POA: Diagnosis not present

## 2016-03-23 DIAGNOSIS — Z9884 Bariatric surgery status: Secondary | ICD-10-CM

## 2016-03-23 DIAGNOSIS — I4891 Unspecified atrial fibrillation: Secondary | ICD-10-CM | POA: Diagnosis present

## 2016-03-23 DIAGNOSIS — Z8249 Family history of ischemic heart disease and other diseases of the circulatory system: Secondary | ICD-10-CM

## 2016-03-23 DIAGNOSIS — Z823 Family history of stroke: Secondary | ICD-10-CM

## 2016-03-23 DIAGNOSIS — R001 Bradycardia, unspecified: Secondary | ICD-10-CM | POA: Insufficient documentation

## 2016-03-23 DIAGNOSIS — Z7982 Long term (current) use of aspirin: Secondary | ICD-10-CM

## 2016-03-23 DIAGNOSIS — Z8673 Personal history of transient ischemic attack (TIA), and cerebral infarction without residual deficits: Secondary | ICD-10-CM

## 2016-03-23 DIAGNOSIS — K219 Gastro-esophageal reflux disease without esophagitis: Secondary | ICD-10-CM | POA: Diagnosis present

## 2016-03-23 DIAGNOSIS — Z79890 Hormone replacement therapy (postmenopausal): Secondary | ICD-10-CM

## 2016-03-23 DIAGNOSIS — G4733 Obstructive sleep apnea (adult) (pediatric): Secondary | ICD-10-CM | POA: Diagnosis present

## 2016-03-23 HISTORY — DX: Reserved for inherently not codable concepts without codable children: IMO0001

## 2016-03-23 HISTORY — DX: Personal history of pneumonia (recurrent): Z87.01

## 2016-03-23 HISTORY — DX: Post-traumatic stress disorder, unspecified: F43.10

## 2016-03-23 HISTORY — DX: Cardiac arrhythmia, unspecified: I49.9

## 2016-03-23 LAB — PULMONARY FUNCTION TEST
DL/VA % PRED: 92 %
DL/VA: 4.25 ml/min/mmHg/L
DLCO UNC % PRED: 58 %
DLCO UNC: 18.94 ml/min/mmHg
FEF 25-75 POST: 1.41 L/s
FEF 25-75 PRE: 1.51 L/s
FEF2575-%CHANGE-POST: -6 %
FEF2575-%PRED-POST: 58 %
FEF2575-%Pred-Pre: 62 %
FEV1-%CHANGE-POST: -1 %
FEV1-%Pred-Post: 68 %
FEV1-%Pred-Pre: 69 %
FEV1-Post: 2.19 L
FEV1-Pre: 2.23 L
FEV1FVC-%CHANGE-POST: 2 %
FEV1FVC-%PRED-PRE: 99 %
FEV6-%Change-Post: -3 %
FEV6-%PRED-POST: 70 %
FEV6-%Pred-Pre: 73 %
FEV6-Post: 2.94 L
FEV6-Pre: 3.03 L
FEV6FVC-%CHANGE-POST: 1 %
FEV6FVC-%Pred-Post: 106 %
FEV6FVC-%Pred-Pre: 105 %
FVC-%Change-Post: -4 %
FVC-%PRED-POST: 66 %
FVC-%Pred-Pre: 69 %
FVC-Post: 2.94 L
FVC-Pre: 3.07 L
POST FEV1/FVC RATIO: 75 %
PRE FEV6/FVC RATIO: 99 %
Post FEV6/FVC ratio: 100 %
Pre FEV1/FVC ratio: 73 %
RV % pred: 114 %
RV: 2.82 L
TLC % pred: 83 %
TLC: 5.87 L

## 2016-03-23 LAB — COMPREHENSIVE METABOLIC PANEL
ALBUMIN: 4.1 g/dL (ref 3.5–5.0)
ALK PHOS: 60 U/L (ref 38–126)
ALT: 13 U/L — AB (ref 17–63)
AST: 18 U/L (ref 15–41)
Anion gap: 8 (ref 5–15)
BILIRUBIN TOTAL: 1.1 mg/dL (ref 0.3–1.2)
BUN: 12 mg/dL (ref 6–20)
CALCIUM: 9.3 mg/dL (ref 8.9–10.3)
CO2: 21 mmol/L — AB (ref 22–32)
CREATININE: 1.42 mg/dL — AB (ref 0.61–1.24)
Chloride: 109 mmol/L (ref 101–111)
GFR calc Af Amer: 56 mL/min — ABNORMAL LOW (ref >60)
GFR calc non Af Amer: 49 mL/min — ABNORMAL LOW (ref >60)
GLUCOSE: 141 mg/dL — AB (ref 65–99)
Potassium: 4.2 mmol/L (ref 3.5–5.1)
SODIUM: 138 mmol/L (ref 135–145)
TOTAL PROTEIN: 6.3 g/dL — AB (ref 6.5–8.1)

## 2016-03-23 LAB — VAS US DOPPLER PRE CABG
LCCAPSYS: 98 cm/s
LEFT ECA DIAS: -6 cm/s
LEFT VERTEBRAL DIAS: -12 cm/s
Left CCA dist dias: 15 cm/s
Left CCA dist sys: 60 cm/s
Left CCA prox dias: 26 cm/s
Left ICA dist dias: -24 cm/s
Left ICA dist sys: -63 cm/s
Left ICA prox dias: -21 cm/s
Left ICA prox sys: -61 cm/s
RCCADSYS: -44 cm/s
RIGHT ECA DIAS: -10 cm/s
RIGHT VERTEBRAL DIAS: 11 cm/s
Right CCA prox dias: 14 cm/s
Right CCA prox sys: 76 cm/s

## 2016-03-23 LAB — URINALYSIS, ROUTINE W REFLEX MICROSCOPIC
BILIRUBIN URINE: NEGATIVE
Glucose, UA: NEGATIVE mg/dL
Hgb urine dipstick: NEGATIVE
KETONES UR: NEGATIVE mg/dL
Leukocytes, UA: NEGATIVE
NITRITE: NEGATIVE
PH: 6 (ref 5.0–8.0)
PROTEIN: NEGATIVE mg/dL
Specific Gravity, Urine: 1.01 (ref 1.005–1.030)

## 2016-03-23 LAB — BLOOD GAS, ARTERIAL
Acid-base deficit: 0.3 mmol/L (ref 0.0–2.0)
BICARBONATE: 23.6 meq/L (ref 20.0–24.0)
Drawn by: 206361
FIO2: 0.21
O2 SAT: 96.3 %
PATIENT TEMPERATURE: 98.6
PH ART: 7.425 (ref 7.350–7.450)
PO2 ART: 81.2 mmHg (ref 80.0–100.0)
TCO2: 24.7 mmol/L (ref 0–100)
pCO2 arterial: 36.6 mmHg (ref 35.0–45.0)

## 2016-03-23 LAB — APTT: aPTT: 32 seconds (ref 24–36)

## 2016-03-23 LAB — TYPE AND SCREEN
ABO/RH(D): O POS
ANTIBODY SCREEN: NEGATIVE

## 2016-03-23 LAB — CBC
HEMATOCRIT: 42.7 % (ref 39.0–52.0)
HEMOGLOBIN: 14.3 g/dL (ref 13.0–17.0)
MCH: 31 pg (ref 26.0–34.0)
MCHC: 33.5 g/dL (ref 30.0–36.0)
MCV: 92.6 fL (ref 78.0–100.0)
Platelets: 148 10*3/uL — ABNORMAL LOW (ref 150–400)
RBC: 4.61 MIL/uL (ref 4.22–5.81)
RDW: 13.1 % (ref 11.5–15.5)
WBC: 6.5 10*3/uL (ref 4.0–10.5)

## 2016-03-23 LAB — SURGICAL PCR SCREEN
MRSA, PCR: NEGATIVE
STAPHYLOCOCCUS AUREUS: NEGATIVE

## 2016-03-23 LAB — PROTIME-INR
INR: 1.13
PROTHROMBIN TIME: 14.6 s (ref 11.4–15.2)

## 2016-03-23 LAB — ABO/RH: ABO/RH(D): O POS

## 2016-03-23 LAB — GLUCOSE, CAPILLARY: Glucose-Capillary: 139 mg/dL — ABNORMAL HIGH (ref 65–99)

## 2016-03-23 MED ORDER — ALBUTEROL SULFATE (2.5 MG/3ML) 0.083% IN NEBU
2.5000 mg | INHALATION_SOLUTION | Freq: Once | RESPIRATORY_TRACT | Status: AC
  Administered 2016-03-23: 2.5 mg via RESPIRATORY_TRACT

## 2016-03-23 NOTE — Progress Notes (Signed)
PCP: Dr. Hulan Fess Cardiologist: Dr. Aundra Dubin EKG: 03/23/16 CXR: 03/23/16 Echo: 01/23/16 Stress test: 01/02/16 Cath: 01/23/16 Sleep study: 02/08/00, pt with sleep apnea and wears CPAP  Pt instructed to go to PFTs and Dopplar after this appointment, pt verbalized understanding.   Pt with no shortness of breath or cough, no complaints of chest pain or infection. Pt states he did gain 10lb since yesterday morning on his scale at home, and pt states his hands are more swollen than normal. VSS Pt states he took his normal lasix dose this morning and has lost 4 lb since then according to our scale. Ebony Hail NP notified, pt states he saw Dr. Roxy Manns this morning, who instructed him to take an extra dose of lasix today. Pt states he has been voiding frequently today.   Pt last A1c "five something" 6 months ago, pt does not routinely check CBGs, but states it is normally 140 fasting when he does take it. Pt instructed to begin checking CBG 4x/day before surgery and to notify PCP if CBG >200 fasting.

## 2016-03-23 NOTE — Progress Notes (Signed)
[address]       [place] [address]             [phone]     CARDIOTHORACIC SURGERY OFFICE NOTE  Referring Provider is Cyndia Bent, Fernande Boyden, MD  Primary Cardiologist is [name], MD PCP is Gennette Pac, MD   HPI:  Patient returns to the office today for follow-up of stage D severe symptomatic primary mitral regurgitation. He was last seen in consultation on 02/13/2016 at which time we made plans for surgery scheduled for 03/25/2016. The patient reports no significant changes over the last several weeks. He continues to experience fatigue and shortness of breath with low-level activity. His breathing has been stable and he specifically denies any resting shortness of breath, PND, orthopnea, or lower extremity edema.   Current Outpatient Prescriptions  Medication Sig Dispense Refill  . amiodarone (PACERONE) 200 MG tablet Take 1 tablet (200 mg total) by mouth 2 (two) times daily. 14 tablet 0  . aspirin EC 81 MG tablet Take 81 mg by mouth daily.    . carvedilol (COREG) 6.25 MG tablet TAKE ONE TABLET BY MOUTH TWICE DAILY 60 tablet 2  . cetirizine (ZYRTEC) 10 MG chewable tablet Chew 10 mg by mouth daily.    . fluticasone (FLONASE) 50 MCG/ACT nasal spray Place 1 spray into both nostrils daily as needed for allergies or rhinitis.    . furosemide (LASIX) 40 MG tablet Take 1 tablet (40mg ) by mouth in the morning and 1/2 tablet (20mg ) about 4PM (Patient taking differently: Take 20-40 mg by mouth 2 (two) times daily. Take 1 tablet (40mg ) by mouth in the morning and 1/2 tablet (20mg ) about 4PM) 135 tablet 1  . HYDROcodone-acetaminophen (NORCO) 10-325 MG tablet Take 1 tablet by mouth 3 (three) times daily as needed for moderate pain.    . metFORMIN (GLUCOPHAGE) 500 MG tablet Take 500 mg by mouth 2 (two) times daily with a meal.    . Multiple Vitamins-Minerals (CENTRUM ADULTS PO) Take 1 tablet by mouth daily.    . nitroGLYCERIN (NITROSTAT) 0.4 MG SL tablet Place 1  tablet (0.4 mg total) under the tongue every 5 (five) minutes as needed for chest pain. 25 tablet 3  . potassium chloride SA (K-DUR,KLOR-CON) 20 MEQ tablet Take 1 tablet (20 mEq total) by mouth 2 (two) times daily. (Patient taking differently: Take 20 mEq by mouth daily. ) 180 tablet 1  . rosuvastatin (CRESTOR) 10 MG tablet Take 10 mg by mouth daily.    . tadalafil (CIALIS) 20 MG tablet Take 20 mg by mouth daily as needed for erectile dysfunction.    Marland Kitchen testosterone cypionate (DEPOTESTOSTERONE CYPIONATE) 200 MG/ML injection Inject 100 mg into the muscle every Tuesday.     . traZODone (DESYREL) 100 MG tablet Take 100 mg by mouth at bedtime as needed for sleep.    . VENTOLIN HFA 108 (90 Base) MCG/ACT inhaler Inhale 1 puff into the lungs every 4 (four) hours as needed for wheezing or shortness of breath.      No current facility-administered medications for this visit.       Physical Exam:   BP 116/72 (BP Location: Right Arm, Patient Position: Sitting, Cuff Size: Normal)   Pulse (!) 57   Resp 20   Ht 5\' 9"  (1.753 m)   Wt 205 lb (93 kg)   SpO2 97%   BMI 30.27 kg/m   General:  Well-appearing  Chest:   Clear to auscultation  CV:   Regular rate and rhythm with soft systolic murmur  Incisions:  n/a  Abdomen:  Soft nontender  Extremities:  Warm and well-perfused  Diagnostic Tests:  CT ANGIOGRAPHY CHEST, ABDOMEN AND PELVIS  TECHNIQUE: Multidetector CT imaging through the chest, abdomen and pelvis was performed using the standard protocol during bolus administration of intravenous contrast. Multiplanar reconstructed images and MIPs were obtained and reviewed to evaluate the vascular anatomy.  CONTRAST:  75 mL of Isovue 370.  COMPARISON:  CT of the chest 05/06/2015. CT the abdomen and pelvis 05/07/2006.  FINDINGS: Creatinine was obtained on site at [place] at [address]  Results: Creatinine 1.4 mg/dL.  CTA CHEST FINDINGS  Cardiovascular: Heart size  is mildly enlarged with left atrial dilatation. There is no significant pericardial fluid, thickening or pericardial calcification. There is aortic atherosclerosis, as well as atherosclerosis of the great vessels of the mediastinum and the coronary arteries, including calcified atherosclerotic plaque in the left main, left anterior descending, left circumflex and right coronary arteries. Status post median sternotomy for CABG. Pulmonic trunk is mildly dilated measuring 3.5 cm in diameter.  Mediastinum/Nodes: No pathologically enlarged mediastinal or hilar lymph nodes. Esophagus is normal in appearance. No axillary lymphadenopathy.  Lungs/Pleura: No suspicious appearing pulmonary nodules or masses. No acute consolidative airspace disease. No pleural effusions.  Musculoskeletal: Median sternotomy wires. Old healed fractures of the antrerolateral aspects of the right third and fourth ribs incidentally noted. There are no aggressive appearing lytic or blastic lesions noted in the visualized portions of the skeleton.  Review of the MIP images confirms the above findings.  CTA ABDOMEN AND PELVIS FINDINGS  Hepatobiliary: No suspicious cystic or solid hepatic lesions. No intra or extrahepatic biliary ductal dilatation. Small foci of high attenuation lying dependently in the gallbladder may reflect biliary sludge and/or tiny gallstones. No evidence of acute cholecystitis at this time.  Pancreas: No pancreatic mass. No pancreatic ductal dilatation. No pancreatic or peripancreatic fluid or inflammatory changes.  Spleen: Unremarkable.  Small splenule inferior to the spleen.  Adrenals/Urinary Tract: There are 2 nonobstructive calculi within the right renal collecting system, largest of which measures up to 7 mm in the lower pole. No additional calculi are noted within the left renal collecting system, along the course of either ureter, or within the lumen of the urinary bladder.  No hydroureteronephrosis to indicate urinary tract obstruction at this time. Mild bilateral renal atrophy. No suspicious renal lesions. Urinary bladder is normal in appearance. Bilateral adrenal glands are normal in appearance.  Stomach/Bowel: LapBand in position, which appears properly located. Stomach is otherwise normal in appearance. No pathologic dilatation of small bowel or colon. Numerous colonic diverticuli are noted, particularly in the descending colon and sigmoid colon, without surrounding inflammatory changes to suggest an acute diverticulitis at this time. Normal appendix.  Vascular/Lymphatic: Aortic atherosclerosis, in addition to atherosclerotic disease throughout the remaining abdominal and pelvic vasculature, without evidence of aneurysm or dissection. Ectasia of the common iliac arteries bilaterally, which both measure up to 14 mm in diameter. Marked tortuosity of the pelvic arteries. Celiac axis, superior mesenteric artery, inferior mesenteric artery and their major branches are all widely patent without hemodynamically significant stenosis. Single renal arteries bilaterally, both of which are widely patent. No lymphadenopathy noted in the abdomen or pelvis.  Reproductive: Prostate gland and seminal vesicles are unremarkable in appearance.  Other: No significant volume of ascites.  No pneumoperitoneum.  Musculoskeletal: There are no aggressive appearing lytic or blastic lesions noted in the  visualized portions of the skeleton.  Review of the MIP images confirms the above findings.  IMPRESSION: 1. Cardiomegaly with moderate left atrial dilatation. 2. Aortic atherosclerosis, in addition to left main and 3 vessel coronary artery disease. Status post median sternotomy for CABG. 3. Ectasia of the common iliac arteries bilaterally which measure up to 14 mm in diameter. Marked tortuosity of the pelvic arterial vessels. 4. Mild dilatation of the pulmonic  trunk, which could suggest underlying pulmonary arterial hypertension. 5. Mild bilateral renal atrophy. 6. Nonobstructive calculi within the collecting system of the right kidney measuring up to 7 mm in the lower pole. No ureteral stones or findings of urinary tract obstruction are noted at this time. 7. Tiny gallstones or biliary sludge lying dependently in the gallbladder. No evidence of acute cholecystitis at this time. 8. Colonic diverticulosis without evidence of acute diverticulitis at this time. 9. Additional incidental findings, as above.   Electronically Signed   By: Vinnie Langton M.D.   On: 03/12/2016 15:29   Impression:  Patient has stage D severe symptomatic primary mitral regurgitation. He presents with progressive symptoms of exertional shortness of breath and fatigue consistent with chronic diastolic congestive heart failure, New York Heart Association functional class IIB-III.  I have personally reviewed the patient's recent transthoracic and transesophageal echocardiograms. He has mild left ventricular systolic dysfunction with ejection fraction estimated 50-60% in the setting of severe mitral regurgitation. Transesophageal echocardiogram confirms the presence of severe mitral regurgitation with an eccentric jet directed posteriorly around the left atrium. The posterior leaflet appears partially restricted and there may be some degree of prolapse or overriding of the anterior leaflet. There does not appear to be an extensive amount of calcification in the posterior leaflet or posterior annulus, and it would appear that there remains a fairly high likelihood that his valve should be repairable.  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  There is high-grade stenosis in a small diagonal branch but no  significant proximal flow limiting disease in the left anterior descending coronary artery territory.  I would agree that there is no indication for redo coronary artery bypass grafting at this time.  Risks associated with mitral valve repair are somewhat elevated by the patient's age, comorbid medical problems, and previous coronary artery bypass grafting, but risks associated with conventional surgery or certainly not prohibitive. Moreover, risks of surgery might be somewhat less with a minimally invasive approach for surgery.  CT angiography reveals no significant contraindications to peripheral arterial cannulation for surgery.  Plan:  The patient and his wife were again counseled at length regarding the indications, risks and potential benefits of mitral valve repair.  The rationale for elective surgery has been explained, including a comparison between surgery and continued medical therapy with close follow-up.  The likelihood of successful and durable valve repair has been discussed with particular reference to the findings of their recent echocardiogram.  Based upon these findings and previous experience, I have quoted them a greater than 90 percent likelihood of successful valve repair.  In the unlikely event that their valve cannot be successfully repaired, we discussed the possibility of replacing the mitral valve using a mechanical prosthesis with the attendant need for long-term anticoagulation versus the alternative of replacing it using a bioprosthetic tissue valve with its potential for late structural valve deterioration and failure, depending upon  the patient's longevity.  The patient specifically requests that if the mitral valve must be replaced that it be done using a bioprosthetic tissue valve.   Alternative surgical approaches have been discussed including a comparison between conventional sternotomy and minimally-invasive techniques.  The relative risks and benefits of each have been  reviewed as they pertain to the patient's specific circumstances, and all of their questions have been addressed.  Specific risks potentially related to the minimally-invasive approach were discussed at length, including but not limited to risk of conversion to full or partial sternotomy, aortic dissection or other major vascular complication, unilateral acute lung injury or pulmonary edema, phrenic nerve dysfunction or paralysis, rib fracture, chronic pain, lung hernia, or lymphocele.  The patient understands and accepts all potential risks of surgery including but not limited to risk of death, stroke or other neurologic complication, myocardial infarction, congestive heart failure, respiratory failure, renal failure, bleeding requiring transfusion and/or reexploration, arrhythmia, infection or other wound complications, pneumonia, pleural and/or pericardial effusion, pulmonary embolus, aortic dissection or other major vascular complication, or delayed complications related to valve repair or replacement including but not limited to structural valve deterioration and failure, thrombosis, embolization, endocarditis, or paravalvular leak.  We plan to proceed with surgery on Wednesday, 03/25/2016.   All of their questions have been answered.  I spent in excess of 30 minutes during the conduct of this office consultation and >50% of this time involved direct face-to-face encounter with the patient for counseling and/or coordination of their care.    Valentina Gu. Roxy Manns, MD 03/23/2016 10:02 AM

## 2016-03-23 NOTE — H&P (Signed)
[address]       [place] [address]             [phone]          CARDIOTHORACIC SURGERY HISTORY AND PHYSICAL EXAM  Referring Provider is Gaye Pollack, MD  Primary Cardiologist is [name], MD PCP is  Melinda Crutch, MD      Chief Complaint  Patient presents with  . Mitral Regurgitation    Surgical evaluation for minimally invasive mitral valve repair, saw Dr Cyndia Bent 02/03/16    HPI:  Patient is a 70 year old male with history of coronary artery disease status post coronary artery bypass grafting by Dr. Cyndia Bent in 1995, hypertension, chronic diastolic congestive heart failure, hyperlipidemia, type 2 diabetes mellitus, and obstructive sleep apnea who has been referred for a second surgical opinion to discuss treatment options for management of severe symptomatic mitral regurgitation.  The patient underwent coronary artery bypass grafting in 1995.  Although details of the procedure are not currently available, grafts placed at the time of surgery included saphenous vein graft to the obtuse marginal branch and saphenous vein graft to the posterior descending coronary artery.  He states that he recovered uneventfully.  Since then he has done well from a cardiac standpoint until recently.  He was followed for many years by Dr. Maurene Capes, and more recently he has been followed by Dr. Aundra Dubin.  Over the past year the patient complains of progressive symptoms of exertional shortness of breath and fatigue. Symptoms have gotten considerably worse over the past 6-8 weeks, and the patient has began to experience significant fluid retention. He was seen in follow-up by Dr. Aundra Dubin in early May and his dose of Lasix was doubled. He underwent nuclear stress test 12/03/2015 that revealed previous inferior wall infarction with mild peri-infarct ischemia and resting ejection fraction estimated 47%. There were no ST segment changes noted during stress. Transthoracic  echocardiogram performed 01/08/2016 revealed normal left ventricular systolic function with at least moderate mitral regurgitation that was eccentric and directed posteriorly. The patient subsequently underwent transesophageal echocardiogram on 01/23/2016 by Dr. Aundra Dubin. Ejection fraction was estimated 55-60%. There was severe mitral regurgitation with an eccentric, posteriorly directed jet. There was no flow reversal in the pulmonary veins. The posterior leaflet appeared somewhat restricted and there may be some associated prolapse of the anterior leaflet. Follow-up catheterization performed 01/23/2016 revealed continued patency of vein graft to the posterior descending coronary artery and the obtuse marginal branch of the left circumflex coronary artery. There is high-grade stenosis in the distal segment of a diagonal branch that was unchanged from previous catheterizations. The left anterior descending coronary artery was relatively small and free of disease with exception of a discrete 90% lesion in the very distal portion of the vessel. Pulmonary artery pressures were mild to moderately elevated. The patient was referred for surgical consultation and previously evaluated by Dr. Cyndia Bent. The patient has been referred for a second surgical opinion to discuss the possibility of minimally invasive approach for mitral valve repair.  The patient is married and lives locally in [place] with his wife.  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 In the past he made an effort to get some type of exercise on a regular basis. He states that oe e pt 6 months or more he has not been able to exercise at  all because of exertional shortness of breath and fatigue.  He now gets short of breath and tired with moderate level activity. He denies any history of resting shortness of breath, PND, or orthopnea. He has had some  lower extremity edema that has improved with increased diuretic therapy. However, despite the increased dose of Lasix he feels as though his exercise tolerance continues to deteriorate. He has not had any chest pain or chest tightness either with activity or rest. He has had occasional palpitations without dizzy spells or syncope.    Past Medical History:  Diagnosis Date  . Allergic rhinitis   . Anxiety   . Cancer ([facility])    hx of skin cancers   . Coronary artery disease    a. s/p MI & CABG; b. s/p PCI Diag;  c. Cath 12/2011: LAD diff dzs/small, Diag 75% isr, 90 dist to stent (small), LCX & RCA occluded, VG->OM 40, VG->PDA patent - med rx.  . Depression   . Diabetes mellitus    ORAL MEDS  . Diastolic CHF, chronic (HCC)    a. EF 55-60% by echo 2007  . Dysrhythmia    "abnormal beat"  . GERD (gastroesophageal reflux disease)    "not anymore" " I had a lap band"  . History of diverticulitis of colon    5 YRS AGO  . History of pneumonia    2017  . History of transient ischemic attack (TIA)    CAROTID DOPPLERS NOV 2011  0-39& BIL. STENOSIS  . Hyperlipidemia   . Hypertension   . Mitral regurgitation   . Myocardial infarction ([place])   . Neuromuscular disorder ([place])    left arm numbness   . 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 PTSD (post-traumatic stress disorder)    from [place]  . Shortness of breath dyspnea    with exertion  . Sleep apnea    cpap- see ov [facility] in [facility] 05/12/11 for settings     Past Surgical History:  Procedure Laterality Date  . BLEPHAROPLASTY  1985   BILATERAL  . CARDIAC CATHETERIZATION  2001, 2004, 2009, 2011  . CARDIAC CATHETERIZATION N/A 01/23/2016   Procedure: Right/Left Heart Cath and Coronary Angiography;  Surgeon: [name], MD;  Location: [facility];  Service: Cardiovascular;  Laterality: N/A;  . CATARACT EXTRACTION W/ INTRAOCULAR LENS IMPLANT Bilateral   . CHONDROPLASTY  07/22/2011    Procedure: CHONDROPLASTY;  Surgeon: Dione Plover Aluisio;  Location: [facility];  Service: Orthopedics;;  . CIRCUMCISION  35 YRS  AGO  . COLONOSCOPY    . CORONARY ANGIOPLASTY  1996-- POST CABG   W/ STENT, last cath 01/07/2012   . CORONARY ARTERY BYPASS GRAFT  1995   X3 VESSEL  . CORONARY STENT PLACEMENT    . KNEE ARTHROSCOPY  07/22/2011   Procedure: ARTHROSCOPY KNEE;  Surgeon: [name];  Location: [facility];  Service: Orthopedics;  Laterality: Right;  WITH DEBRIDEMENT   . KNEE ARTHROSCOPY W/ MENISCECTOMY  X2 IN 2002-- RIGHT KNEE  . LAPAROSCOPIC GASTRIC BANDING  01-15-09  . LEFT HEART CATHETERIZATION WITH CORONARY ANGIOGRAM N/A 09/11/2013   Procedure: LEFT HEART CATHETERIZATION WITH CORONARY ANGIOGRAM;  Surgeon: [name], MD;  Location: [facility];  Service: Cardiovascular;  Laterality: N/A;  . RIGHT KNEE ED COMPARTMENT REPLACEMENT  2004  . SYNOVECTOMY  07/22/2011   Procedure: SYNOVECTOMY;  Surgeon: [name];  Location: [facility];  Service: Orthopedics;;  . TEE WITHOUT CARDIOVERSION N/A 01/23/2016   Procedure: TRANSESOPHAGEAL ECHOCARDIOGRAM (TEE);  Surgeon: [name], MD;  Location: [facility];  Service: Cardiovascular;  Laterality: N/A;  . UMBILICAL HERNIA REPAIR  01-15-09   W/ GASTRIC BANDING PROCEDURE    Family History  Problem Relation Age of Onset  . Other Mother     joint problems  . Hypertension Father   . Heart disease Father   . CVA Father   . Depression Father   . Heart disease Sister     CABG  . Stroke Sister   . Hypertension Sister   . Congestive Heart Failure Sister   . Diabetes Sister   . Heart disease Brother   . Hypertension Brother   . Congestive Heart Failure Brother     Social History Social History  Substance Use Topics  . Smoking status: Never Smoker  . Smokeless tobacco: Never Used  . Alcohol use Yes     Comment: OCCASIONAL    Prior to Admission medications   Medication Sig  Start Date End Date Taking? Authorizing Provider  amiodarone (PACERONE) 200 MG tablet Take 1 tablet (200 mg total) by mouth 2 (two) times daily. 02/13/16  Yes [name], MD  aspirin EC 81 MG tablet Take 81 mg by mouth daily.   Yes Historical Provider, MD  carvedilol (COREG) 6.25 MG tablet TAKE ONE TABLET BY MOUTH TWICE DAILY 05/20/15  Yes [name], MD  cetirizine (ZYRTEC) 10 MG chewable tablet Chew 10 mg by mouth daily.   Yes Historical Provider, MD  fluticasone (FLONASE) 50 MCG/ACT nasal spray Place 1 spray into both nostrils daily as needed for allergies or rhinitis.   Yes Historical Provider, MD  furosemide (LASIX) 40 MG tablet Take 1 tablet (40mg ) by mouth in the morning and 1/2 tablet (20mg ) about 4PM Patient taking differently: Take 20-40 mg by mouth 2 (two) times daily. Take 1 tablet (40mg ) by mouth in the morning and 1/2 tablet (20mg ) about 4PM 12/25/15  Yes [name], MD  HYDROcodone-acetaminophen [facility]) 10-325 MG tablet Take 1 tablet by mouth 3 (three) times daily as needed for moderate pain.   Yes Historical Provider, MD  metFORMIN (GLUCOPHAGE) 500 MG tablet Take 500 mg by mouth 2 (two) times daily with a meal.   Yes Historical Provider, MD  Multiple Vitamins-Minerals (CENTRUM ADULTS PO) Take 1 tablet by mouth daily.   Yes Historical Provider, MD  nitroGLYCERIN (NITROSTAT) 0.4 MG SL tablet Place 1 tablet (0.4 mg total) under the tongue every 5 (five) minutes as needed for chest pain. 08/08/15 04/11/17 Yes [name], MD  potassium chloride SA (K-DUR,KLOR-CON) 20 MEQ tablet Take 1 tablet (20 mEq total) by mouth 2 (two) times daily. Patient taking differently: Take 20 mEq by mouth daily.  12/25/15  Yes [name], MD  rosuvastatin (CRESTOR) 10 MG tablet Take 10 mg by mouth daily.   Yes Historical Provider, MD  tadalafil (CIALIS) 20 MG tablet Take 20 mg by mouth daily as needed for erectile dysfunction.   Yes Historical Provider, MD  testosterone cypionate  (DEPOTESTOSTERONE CYPIONATE) 200 MG/ML injection Inject 100 mg into the muscle every Tuesday.  06/25/15  Yes Historical Provider, MD  traZODone (DESYREL) 100 MG tablet Take 100 mg by mouth at bedtime as needed for sleep.   Yes Historical Provider, MD  VENTOLIN HFA 108 (90 Base) MCG/ACT inhaler Inhale 1 puff into the lungs every 4 (four) hours as  needed for wheezing or shortness of breath.  01/10/16  Yes Historical Provider, MD    Allergies  Allergen Reactions  . Codeine Itching and Other (See Comments)    Extremity tingling-- can take synthetic    Review of Systems:                        General:                                          normal appetite, decreased energy, + weight gain, no weight loss, no fever                       Cardiac:                                           no chest pain with exertion, no chest pain at rest, +SOB with exertion, no resting SOB, no PND, no orthopnea, + palpitations, no arrhythmia, no atrial fibrillation, + LE edema, no dizzy spells, no syncope                       Respiratory:                                     + shortness of breath, no home oxygen, + productive cough, + dry cough, + bronchitis, + wheezing, no hemoptysis, no asthma, no pain with inspiration or cough, + sleep apnea, + CPAP at night                       GI:                                                             no difficulty swallowing, no reflux, no frequent heartburn, no hiatal hernia, no abdominal pain, no constipation, no diarrhea, no hematochezia, no hematemesis, no melena                       GU:                                                            no dysuria,  + frequency, no urinary tract infection, no hematuria, no enlarged prostate, no kidney stones, no kidney disease                       Vascular:                                         no pain suggestive of claudication, + pain in feet, no leg cramps, no varicose veins,  no DVT, no non-healing foot ulcer                        Neuro:                                                       no stroke, no TIA's, no seizures, no headaches, no temporary blindness one eye,  no slurred speech, no peripheral neuropathy, no chronic pain, no instability of gait, no memory/cognitive dysfunction                       Musculoskeletal:                   no arthritis, no joint swelling, no myalgias, no difficulty walking, normal mobility                        Skin:                                                          no rash, no itching, no skin infections, no pressure sores or ulcerations                       Psych:                                                       + anxiety, + depression, no nervousness, + unusual recent stress                       Eyes:                                                         no blurry vision, no floaters, no recent vision changes, does not wear glasses or contacts                       ENT:                                                          no hearing loss, no loose or painful teeth, no dentures, last saw dentist within the past year                       Hematologic:                                   + easy bruising, no abnormal  bleeding, no clotting disorder, no frequent epistaxis                       Endocrine:                                       + diabetes, does occasionally check CBG's at home                                               Physical Exam:                        BP 130/70 mmHg  Pulse 71  Resp 20  Ht 5\' 9"  (1.753 m)  Wt 215 lb (97.523 kg)  BMI 31.74 kg/m2  SpO2 96%                       General:                                          Moderately obese,  well-appearing                       HEENT:                                           Unremarkable                        Neck:                                                         no JVD, no bruits, no adenopathy                        Chest:                                                         clear to auscultation, symmetrical breath sounds, no wheezes, no rhonchi                        CV:                                                            RRR, grade II/VI blowing holosystolic murmur                        Abdomen:  soft, non-tender, no masses                        Extremities:                                     warm, well-perfused, pulses palpable, no LE edema                       Rectal/GU                                       Deferred                       Neuro:                                                       Grossly non-focal and symmetrical throughout                       Skin:                                                          Clean and dry, no rashes, no breakdown   Diagnostic Tests:  NUCLEAR STRESS TEST            Vitals     Height Weight BMI (Calculated)    5\' 10"  (1.778 m) 223 lb 15.8 oz (101.6 kg) 32.2      Study Highlights      Nuclear stress EF: 47%.  There was no ST segment deviation noted during stress.  The left ventricular ejection fraction is mildly decreased (45-54%).  This is a low risk study.  Low risk stress nuclear study with moderate size, severe intensity, partially reversible inferior and apical defect; findings consistent with prior infarct vs thinning and very mild ischemia towards the apex; EF 47 with global hyokinesis.       Nuclear History and Indications     History and Indications Indication for Stress Test: Evaluation of extent and severity of coronary artery disease History: CAD;'06 MPI;EF=63% and scar Cardiac Risk Factors: Family History - CAD, Hypertension and Lipids  Symptoms: Chest Pain at Rest and with Exertion (last date of chest discomfort yesterday), DOE, Fatigue with Exertion, Palpitations and SOB      Stress Findings     ECG Sunus with ventricular bigemeny, incomplete RBBB, inferior lateral TWI. Marland Kitchen    Stress Findings A pharmacological  stress test was performed using IV Lexiscan 0.4mg  over 10 seconds performed without concurrent submaximal exercise.  The patient reported shortness of breath and fatigue during the stress test.   Test was stopped per protocol.    Response to Stress There was no ST segment deviation noted during stress.  Arrhythmias during stress: frequent PVCs.  Arrhythmias during recovery: frequent PVCs.  Arrhythmias were not significant.  ECG was interpretable and there was no significant change from baseline.  Stress Measurements        Baseline Vitals  Rest HR 63 bpm    Rest BP 105/69 mmHg    Peak Stress Vitals  Peak HR 80 bpm    Peak BP 117/73 mmHg           Nuclear Stress Measurements     LV sys vol 78 mL    TID 1     LV dias vol 148 mL    LHR 0.27     SSS 4     SRS 0     SDS 4              Nuclear Stress Findings     Isotope administration Rest isotope was administered with an IV injection of 10.7 mCi Tc44m Sestamibi. Rest SPECT images were obtained approximately 45 minutes post tracer injection. Stress isotope was administered with an IV injection of 32.8 mCi Tc24m Sestamibi 20 seconds post IV Lexiscan administration. Stress SPECT images were obtained approximately 60 minutes post tracer injection.    Nuclear Measurements Study was gated.    Rest Perfusion There is a defect present in the basal inferior, apical inferior and apex location.    Stress Perfusion There is a defect present in the basal inferior, apical inferior and apex location.    Overall Study Impression Myocardial perfusion is abnormal. This is a low risk study. Overall left ventricular systolic function was abnormal. LV cavity size is mildly enlarged. Nuclear stress EF: 47%. The left ventricular ejection fraction is mildly decreased (45-54%). There are no images available for comparison.  From: ACCF/SCAI/STS/AATS/AHA/ASNC/HFSA/SCCT 2012 Appropriate Use Criteria for Coronary  Revascularization Focused Update      Wall Scoring                 Score Index: 2.000 Percent Normal: 0.0%           The left ventricle is globally hypokinetic.                Signed     Electronically signed by Lelon Perla, MD on 01/02/16 at 1433 EDT          Echocardiography  Patient: Dreyon, [place] MR #: [id number] Study Date: 01/08/2016 Gender: M Age: 28 Height: 177.8 cm Weight: 101.6 kg BSA: 2.27 m^2 Pt. Status: Room:  SONOGRAPHER Blondell Reveal ATTENDING [name], M.D. ORDERING [name], M.D. REFERRING [name], M.D. PERFORMING [facility], Outpatient  cc:  -------------------------------------------------------------------  ------------------------------------------------------------------- Indications: (R06.02).  ------------------------------------------------------------------- History: PMH: LE edema. Acquired from the patient and from the patient&'s chart. Dyspnea. Coronary artery disease. Congestive heart failure. Risk factors: Hypertension. Diabetes mellitus. Obese. Dyslipidemia.  ------------------------------------------------------------------- Study Conclusions  - Left ventricle: Wall thickness was increased in a pattern of  moderate LVH. Left ventricular diastolic function parameters were  normal. - Mitral valve: Restricted posterior leaflet motion Variable  eccentric MR ? mild to moderate. Consider TEE to further assess. - Left atrium: The atrium was moderately dilated. - Atrial septum: No defect or patent foramen ovale was identified. - Pulmonary arteries: PA peak pressure: 37 mm Hg (S).  ------------------------------------------------------------------- Labs, prior tests, procedures, and surgery: Coronary artery bypass grafting.  Echocardiography. M-mode, complete 2D, spectral Doppler, and color Doppler. Birthdate: Patient  birthdate: 1945/12/05. Age: Patient is 70 yr old. Sex: Gender: male. BMI: 32.1 kg/m^2. Blood pressure: 114/70 Patient status: Outpatient. Study date: Study date: 01/08/2016. Study time: 07:35 AM. Location: [facility]  -------------------------------------------------------------------  ------------------------------------------------------------------- Left ventricle: Wall thickness was increased in  a pattern of moderate LVH. The transmitral flow pattern was normal. The deceleration time of the early transmitral flow velocity was normal. The pulmonary vein flow pattern was normal. The tissue Doppler parameters were normal. Left ventricular diastolic function parameters were normal.  ------------------------------------------------------------------- Aortic valve: Sclerosis without stenosis.  ------------------------------------------------------------------- Aorta: Ascending aorta: The ascending aorta was mildly dilated.  ------------------------------------------------------------------- Mitral valve: Restricted posterior leaflet motion Variable eccentric MR ? mild to moderate. Consider TEE to further assess. Doppler: Peak gradient (D): 4 mm Hg.  ------------------------------------------------------------------- Left atrium: The atrium was moderately dilated.  ------------------------------------------------------------------- Atrial septum: No defect or patent foramen ovale was identified.  ------------------------------------------------------------------- Right ventricle: The cavity size was normal. Wall thickness was normal. Systolic function was normal.  ------------------------------------------------------------------- Pulmonic valve: Doppler: There was mild regurgitation.  ------------------------------------------------------------------- Tricuspid valve: Doppler: There was mild  regurgitation.  ------------------------------------------------------------------- Right atrium: The atrium was normal in size.  ------------------------------------------------------------------- Pericardium: The pericardium was normal in appearance.  ------------------------------------------------------------------- Measurements  Left ventricle Value Reference LV ID, ED, PLAX chordal 51.9 mm 43 - 52 LV ID, ES, PLAX chordal 36.3 mm 23 - 38 LV fx shortening, PLAX chordal 30 % >=29 LV PW thickness, ED 12.6 mm --------- IVS/LV PW ratio, ED 1.21 <=1.3 Stroke volume, 2D 63 ml --------- Stroke volume/bsa, 2D 28 ml/m^2 ---------  Ventricular septum Value Reference IVS thickness, ED 15.2 mm ---------  LVOT Value Reference LVOT ID, S 22 mm --------- LVOT area 3.8 cm^2 --------- LVOT ID 22 mm --------- LVOT peak velocity, S 102 cm/s --------- LVOT mean velocity, S 58.7 cm/s --------- LVOT VTI, S 16.7 cm --------- LVOT peak gradient, S 4 mm Hg --------- Stroke volume (SV), LVOT DP 63.5 ml --------- Stroke index (SV/bsa), LVOT DP 28 ml/m^2 ---------  Aorta Value Reference Aortic root ID, ED 39 mm --------- Ascending aorta ID, A-P, S 41 mm ---------  Left atrium Value Reference LA ID, A-P, ES 49 mm  --------- LA ID/bsa, A-P 2.16 cm/m^2 <=2.2 LA volume, S 116 ml --------- LA volume/bsa, S 51.1 ml/m^2 --------- LA volume, ES, 1-p A4C 106 ml --------- LA volume/bsa, ES, 1-p A4C 46.7 ml/m^2 --------- LA volume, ES, 1-p A2C 126 ml --------- LA volume/bsa, ES, 1-p A2C 55.5 ml/m^2 ---------  Mitral valve Value Reference Mitral E-wave peak velocity 95.8 cm/s --------- Mitral A-wave peak velocity 48.9 cm/s --------- Mitral deceleration time 222 ms 150 - 230 Mitral peak gradient, D 4 mm Hg --------- Mitral E/A ratio, peak 1.8 ---------  Pulmonary arteries Value Reference PA pressure, S, DP (H) 37 mm Hg <=30  Tricuspid valve Value Reference Tricuspid regurg peak velocity 290 cm/s --------- Tricuspid peak RV-RA gradient 34 mm Hg ---------  Systemic veins Value Reference Estimated CVP 3 mm Hg ---------  Right ventricle Value Reference RV pressure, S, DP (H) 37 mm Hg <=30  Legend: (L) and (H) mark values outside specified reference range.  ------------------------------------------------------------------- Prepared and Electronically Authenticated by  Jenkins Rouge, M.D. 2017-05-17T14:56:39   Procedure: TEE  Indication: Mitral regurgitation  Sedation: Versed 2 mg IV, Fentanyl 25 mcg IV  Findings: Please see echo section for full report. Normal LV size with moderate LV hypertrophy. EF 55-60%. Normal wall motion. Mildly dilated RV with normal systolic  function. Peak RV-RA gradient 45 mmHg. Mild TR. Trileaflet aortic valve with no stenosis or regurgitation. Mild-moderately dilated LA with no LAA thrombus. Normal RA. Small PFO. There was eccentric, posteriorly-directed MR. Due to eccentricity, unable to measure PISA. Suspect severe MR, wraps around left atrium. However, there was no  systolic flow reversal in the pulmonary venous doppler pattern. There was a small area of prolapse of a portion of the anterior leaflet. Normal caliber aorta with mild plaque.   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   [name] 01/23/2016   CARDIAC CATHETERIZATION     Procedures    Right/Left Heart Cath and Coronary Angiography    Conclusion    1. No significant change compared to prior cath in 2015. The grafts are patent. There is significant disease in a large diagonal system that is similar in caliber to the continuation of the LAD. This disease is not amenable to intervention (90% distal diagonal stenosis, small caliber at that point).  2. Mildly elevated left and right heart filling pressures.  3. Normal EF with 3+ MR. Suspect low cardiac output reading by Fick is not accurate.   I will hydrate today but increase Lasix to 40 mg po bid starting tomorrow.   Will refer to Dr Cyndia Bent for surgical evaluation given suspected severe MR with symptoms. ?MV clip candidate.     Technique and Indications    Procedure: Right Heart Cath, Left Heart Cath, Selective Coronary Angiography, LV angiography  Indication: Mitral regurgitation, abnormal stress test, exertional dyspnea.   Procedural Details: The right brachial and radial areas were prepped, draped, and anesthetized with 1% lidocaine. There was a pre-existing peripheral IV in the right brachial area that was replaced with a 62F venous sheath. A Swan-Ganz catheter was used for the right heart catheterization. Standard protocol was followed for recording of right heart pressures  and sampling of oxygen saturations. Fick cardiac output was calculated. The right radial artery was entered using modified Seldinger technique and a 44F sheath was placed. JR4 was used to engage the SVG-OM, RCB was used to engage SVG-PDA, JL3.5 was used to engage left main. RCA known to be occluded proximally. There were no immediate procedural complications. The patient was transferred to the post catheterization recovery area for further monitoring.  During this procedure the patient is administered a total of Versed 2 mg and Fentanyl 50 mg to achieve and maintain moderate conscious sedation. The patient's heart rate, blood pressure, and oxygen saturation are monitored continuously during the procedure. The period of conscious sedation is 60 minutes, of which I was present face-to-face 100% of this time. Estimated blood loss <50 mL.    Coronary Findings    Dominance: Right   Left Main  20% distal tapering.     Left Anterior Descending  The LAD gives off a large, branching 1st diagonal that is comparable in size to the continuation of the LAD. The LAD beyond D1 has luminal irregularities. D1 has a stent just after the take-off of a proximal branch. There is 80-90% in-stent restenosis in the proximal portion of the stent. Beyond the stent in the distal D1, there is a 90% stenosis. The vessel is small caliber at this point. The proximal branch off the D1 has a 60-70% ostial stenosis. These findings are similar to the prior study.     Left Circumflex  Totally occluded proximally. SVG-OM is patent with 40% proximal and 40% distal stenoses.     Right Coronary Artery  Not injected, known to be occluded. SVG-PDA is patent with 30% proximal stenosis. PDA does not appear to have significant disease.       Right Heart Pressures RHC Procedural Findings: Hemodynamics (mmHg) RA 10 RV 43/12 PA 42/10, mean 17 Unable to advance PA catheter far enough to wedge given small  caliber brachial vein.   LV 115/20 AO 116/74  Oxygen saturations: PA 57% AO 98%  Cardiac Output (Fick) 3.55  Cardiac Index (Fick) 1.62 I suspect that these Fick numbers are not accurate. LV EF appears within normal range.    Wall Motion    EF 55-60%, 3+ MR.           CT ANGIOGRAPHY CHEST, ABDOMEN AND PELVIS  TECHNIQUE: Multidetector CT imaging through the chest, abdomen and pelvis was performed using the standard protocol during bolus administration of intravenous contrast. Multiplanar reconstructed images and MIPs were obtained and reviewed to evaluate the vascular anatomy.  CONTRAST: 75 mL of Isovue 370.  COMPARISON: CT of the chest 05/06/2015. CT the abdomen and pelvis 05/07/2006.  FINDINGS: Creatinine was obtained on site at [facility] at [address]  Results: Creatinine 1.4 mg/dL.  CTA CHEST FINDINGS  Cardiovascular: Heart size is mildly enlarged with left atrial dilatation. There is no significant pericardial fluid, thickening or pericardial calcification. There is aortic atherosclerosis, as well as atherosclerosis of the great vessels of the mediastinum and the coronary arteries, including calcified atherosclerotic plaque in the left main, left anterior descending, left circumflex and right coronary arteries. Status post median sternotomy for CABG. Pulmonic trunk is mildly dilated measuring 3.5 cm in diameter.  Mediastinum/Nodes: No pathologically enlarged mediastinal or hilar lymph nodes. Esophagus is normal in appearance. No axillary lymphadenopathy.  Lungs/Pleura: No suspicious appearing pulmonary nodules or masses. No acute consolidative airspace disease. No pleural effusions.  Musculoskeletal: Median sternotomy wires. Old healed fractures of the antrerolateral aspects of the right third and fourth ribs incidentally noted. There are no aggressive appearing lytic or blastic lesions noted in the visualized portions of the  skeleton.  Review of the MIP images confirms the above findings.  CTA ABDOMEN AND PELVIS FINDINGS  Hepatobiliary: No suspicious cystic or solid hepatic lesions. No intra or extrahepatic biliary ductal dilatation. Small foci of high attenuation lying dependently in the gallbladder may reflect biliary sludge and/or tiny gallstones. No evidence of acute cholecystitis at this time.  Pancreas: No pancreatic mass. No pancreatic ductal dilatation. No pancreatic or peripancreatic fluid or inflammatory changes.  Spleen: Unremarkable. Small splenule inferior to the spleen.  Adrenals/Urinary Tract: There are 2 nonobstructive calculi within the right renal collecting system, largest of which measures up to 7 mm in the lower pole. No additional calculi are noted within the left renal collecting system, along the course of either ureter, or within the lumen of the urinary bladder. No hydroureteronephrosis to indicate urinary tract obstruction at this time. Mild bilateral renal atrophy. No suspicious renal lesions. Urinary bladder is normal in appearance. Bilateral adrenal glands are normal in appearance.  Stomach/Bowel: LapBand in position, which appears properly located. Stomach is otherwise normal in appearance. No pathologic dilatation of small bowel or colon. Numerous colonic diverticuli are noted, particularly in the descending colon and sigmoid colon, without surrounding inflammatory changes to suggest an acute diverticulitis at this time. Normal appendix.  Vascular/Lymphatic: Aortic atherosclerosis, in addition to atherosclerotic disease throughout the remaining abdominal and pelvic vasculature, without evidence of aneurysm or dissection. Ectasia of the common iliac arteries bilaterally, which both measure up to 14 mm in diameter. Marked tortuosity of the pelvic arteries. Celiac axis, superior mesenteric artery, inferior mesenteric artery and their major branches are all  widely patent without hemodynamically significant stenosis. Single renal arteries bilaterally, both of which are widely patent. No lymphadenopathy noted in the abdomen or pelvis.  Reproductive: Prostate gland and  seminal vesicles are unremarkable in appearance.  Other: No significant volume of ascites. No pneumoperitoneum.  Musculoskeletal: There are no aggressive appearing lytic or blastic lesions noted in the visualized portions of the skeleton.  Review of the MIP images confirms the above findings.  IMPRESSION: 1. Cardiomegaly with moderate left atrial dilatation. 2. Aortic atherosclerosis, in addition to left main and 3 vessel coronary artery disease. Status post median sternotomy for CABG. 3. Ectasia of the common iliac arteries bilaterally which measure up to 14 mm in diameter. Marked tortuosity of the pelvic arterial vessels. 4. Mild dilatation of the pulmonic trunk, which could suggest underlying pulmonary arterial hypertension. 5. Mild bilateral renal atrophy. 6. Nonobstructive calculi within the collecting system of the right kidney measuring up to 7 mm in the lower pole. No ureteral stones or findings of urinary tract obstruction are noted at this time. 7. Tiny gallstones or biliary sludge lying dependently in the gallbladder. No evidence of acute cholecystitis at this time. 8. Colonic diverticulosis without evidence of acute diverticulitis at this time. 9. Additional incidental findings, as above.   Electronically Signed By: Vinnie Langton M.D. On: 03/12/2016 15:29       Impression:  Patient has stage D severe symptomatic primary mitral regurgitation. He presents with progressive symptoms of exertional shortness of breath and fatigue consistent with chronic diastolic congestive heart failure, New York Heart Association functional class IIB-III. I have personally reviewed the patient's recent transthoracic and transesophageal echocardiograms.  He has mild left ventricular systolic dysfunction with ejection fraction estimated 50-60% in the setting of severe mitral regurgitation. Transesophageal echocardiogram confirms the presence of severe mitral regurgitation with an eccentric jet directed posteriorly around the left atrium. The posterior leaflet appears partially restricted and there may be some degree of prolapse or overriding of the anterior leaflet. There does not appear to be an extensive amount of calcification in the posterior leaflet or posterior annulus, and it would appear that there remains a fairly high likelihood that his valve should be repairable. 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 There is high-grade stenosis in a small diagonal branch but no significant proximal flow limiting disease in the left anterior descending coronary artery territory. I would agree that there is no indication for redo coronary artery bypass grafting at this time. Risks associated with mitral valve repair are somewhat elevated by the patient's age, comorbid medical problems, and previous coronary artery bypass grafting, but risks associated with conventional surgery or certainly not prohibitive. Moreover, risks of surgery might be somewhat less with a minimally invasive approach for surgery.  CT angiography reveals no significant contraindications to peripheral arterial cannulation for surgery.  Plan:  The patient and his wife were again counseled at length regarding the indications, risks and potential benefits of mitral valve repair. The rationale for elective surgery has been explained, including a comparison between surgery and continued medical therapy with close follow-up. The likelihood of successful and durable valve repair has been discussed with particular reference to  the findings of their recent echocardiogram. Based upon these findings and previous experience, I have quoted them a greater than 90 percent likelihood of successful valve repair. In the unlikely event that their valve cannot be successfully repaired, we discussed the possibility of replacing the mitral valve using a mechanical prosthesis with the attendant need for long-term anticoagulation versus the alternative of  replacing it using a bioprosthetic tissue valve with its potential for late structural valve deterioration and failure, depending upon the patient's longevity. The patient specifically requests that if the mitral valve must be replaced that it be done using a bioprosthetic tissue valve. Alternative surgical approaches have been discussed including a comparison between conventional sternotomy and minimally-invasive techniques. The relative risks and benefits of each have been reviewed as they pertain to the patient's specific circumstances, and all of their questions have been addressed. Specific risks potentially related to the minimally-invasive approach were discussed at length, including but not limited to risk of conversion to full or partial sternotomy, aortic dissection or other major vascular complication, unilateral acute lung injury or pulmonary edema, phrenic nerve dysfunction or paralysis, rib fracture, chronic pain, lung hernia, or lymphocele. The patient understands and accepts all potential risks of surgery including but not limited to risk of death, stroke or other neurologic complication, myocardial infarction, congestive heart failure, respiratory failure, renal failure, bleeding requiring transfusion and/or reexploration, arrhythmia, infection or other wound complications, pneumonia, pleural and/or pericardial effusion, pulmonary embolus, aortic dissection or other major vascular complication, or delayed complications related to valve repair or replacement including but not  limited to structural valve deterioration and failure, thrombosis, embolization, endocarditis, or paravalvular leak. We plan to proceed with surgery on Wednesday, 03/25/2016.   All of their questions have been answered.     Valentina Gu. Roxy Manns, MD 03/23/2016 10:02 AM

## 2016-03-23 NOTE — Patient Instructions (Signed)
   Patient should continue taking all current medications without change through the day before surgery.  Patient should have nothing to eat or drink after midnight the night before surgery.  On the morning of surgery patient should take only carvedilol with a sip of water.

## 2016-03-23 NOTE — Pre-Procedure Instructions (Addendum)
[place]  03/23/2016      [facility] ([place] [place] [id number] [place] ([place]) [place] [address] Phone: [phone] Fax: [phone]    Your procedure is scheduled on August 2  Report to [place] at [place].M.  Call this number if you have problems the morning of surgery:  [phone]   Remember:  Do not eat food or drink liquids after midnight.   Take these medicines the morning of surgery with A SIP OF WATER amiodarone (pacerone), carvedilol (Coreg), cetirizine (zyrtec), fluticasone (flonase), hydrocone-acetaminophen (norco), nitro if needed, trazodone (desyrel), inhaler if needed  7 days prior to surgery STOP taking any Aspirin, Aleve, Naproxen, Ibuprofen, Motrin, Advil, Goody's, BC's, all herbal medications, fish oil, and all vitamins   WHAT DO I DO ABOUT MY DIABETES MEDICATION?   Marland Kitchen Do not take oral diabetes medicines (pills) the morning of surgery. DO NOT TAKE METFORMIN   How to Manage Your Diabetes Before and After Surgery  Why is it important to control my blood sugar before and after surgery? . Improving blood sugar levels before and after surgery helps healing and can limit problems. . A way of improving blood sugar control is eating a healthy diet by: o  Eating less sugar and carbohydrates o  Increasing activity/exercise o  Talking with your doctor about reaching your blood sugar goals . High blood sugars (greater than 180 mg/dL) can raise your risk of infections and slow your recovery, so you will need to focus on controlling your diabetes during the weeks before surgery. . Make sure that the doctor who takes care of your diabetes knows about your planned surgery including the date and location.  How do I manage my blood sugar before surgery? . Check your blood sugar at least 4 times a day, starting 2 days before surgery, to make sure that the level is not too high or low. o Check your  blood sugar the morning of your surgery when you wake up and every 2 hours until you get to the Short Stay unit. . If your blood sugar is less than 70 mg/dL, you will need to treat for low blood sugar: o Do not take insulin. o Treat a low blood sugar (less than 70 mg/dL) with  cup of clear juice (cranberry or apple), 4 glucose tablets, OR glucose gel. o Recheck blood sugar in 15 minutes after treatment (to make sure it is greater than 70 mg/dL). If your blood sugar is not greater than 70 mg/dL on recheck, call [phone] for further instructions. . Report your blood sugar to the short stay nurse when you get to Short Stay.  . If you are admitted to the hospital after surgery: o Your blood sugar will be checked by the staff and you will probably be given insulin after surgery (instead of oral diabetes medicines) to make sure you have good blood sugar levels. o The goal for blood sugar control after surgery is 80-180 mg/dL.    Do not wear jewelry..  Do not wear lotions, powders, or colonge.  You may  NOT wear deoderant.  Men may shave face and neck.  Do not bring valuables to the hospital.  [facility] is not responsible for any belongings or valuables.  Contacts, dentures or bridgework may not be worn into surgery.  Leave your suitcase in the car.  After surgery it may be brought to your room.  For  patients admitted to the hospital, discharge time will be determined by your treatment team.  Patients discharged the day of surgery will not be allowed to drive home.    Special instructions:  [facility]- Preparing For Surgery  Before surgery, you can play an important role. Because skin is not sterile, your skin needs to be as free of germs as possible. You can reduce the number of germs on your skin by washing with CHG (chlorahexidine gluconate) Soap before surgery.  CHG is an antiseptic cleaner which kills germs and bonds with the skin to continue killing germs even after  washing.  Please do not use if you have an allergy to CHG or antibacterial soaps. If your skin becomes reddened/irritated stop using the CHG.  Do not shave (including legs and underarms) for at least 48 hours prior to first CHG shower. It is OK to shave your face.  Please follow these instructions carefully.   1. Shower the NIGHT BEFORE SURGERY and the MORNING OF SURGERY with CHG.   2. If you chose to wash your hair, wash your hair first as usual with your normal shampoo.  3. After you shampoo, rinse your hair and body thoroughly to remove the shampoo.  4. Use CHG as you would any other liquid soap. You can apply CHG directly to the skin and wash gently with a scrungie or a clean washcloth.   5. Apply the CHG Soap to your body ONLY FROM THE NECK DOWN.  Do not use on open wounds or open sores. Avoid contact with your eyes, ears, mouth and genitals (private parts). Wash genitals (private parts) with your normal soap.  6. Wash thoroughly, paying special attention to the area where your surgery will be performed.  7. Thoroughly rinse your body with warm water from the neck down.  8. DO NOT shower/wash with your normal soap after using and rinsing off the CHG Soap.  9. Pat yourself dry with a CLEAN TOWEL.   10. Wear CLEAN PAJAMAS   11. Place CLEAN SHEETS on your bed the night of your first shower and DO NOT SLEEP WITH PETS.    Day of Surgery: Do not apply any deodorants/lotions. Please wear clean clothes to the hospital/surgery center.      Please read over the following fact sheets that you were given. Pain Booklet, Coughing and Deep Breathing, Blood Transfusion Information, MRSA Information and Surgical Site Infection Prevention, incentive spirometry

## 2016-03-23 NOTE — Progress Notes (Signed)
Pre-op Cardiac Surgery  Carotid Findings:  No significant stenosis in bilateral carotid arteries 1-39%. Vertebral arteries demonstrated antegrade flow.  Upper Extremity Right Left  Brachial Pressures 122 126  Radial Waveforms Triphasic Triphasic  Ulnar Waveforms Triphasic Triphasic  Palmar Arch (Allen's Test) WNL WNL

## 2016-03-24 LAB — HEMOGLOBIN A1C
Hgb A1c MFr Bld: 7.2 % — ABNORMAL HIGH (ref 4.8–5.6)
Mean Plasma Glucose: 160 mg/dL

## 2016-03-24 MED ORDER — PHENYLEPHRINE HCL 10 MG/ML IJ SOLN
30.0000 ug/min | INTRAMUSCULAR | Status: AC
  Administered 2016-03-25: 25 ug/min via INTRAVENOUS
  Filled 2016-03-24: qty 2

## 2016-03-24 MED ORDER — DOPAMINE-DEXTROSE 3.2-5 MG/ML-% IV SOLN
0.0000 ug/kg/min | INTRAVENOUS | Status: DC
  Filled 2016-03-24: qty 250

## 2016-03-24 MED ORDER — VANCOMYCIN HCL 10 G IV SOLR
1250.0000 mg | INTRAVENOUS | Status: AC
  Administered 2016-03-25: 1500 mg via INTRAVENOUS
  Filled 2016-03-24: qty 1250

## 2016-03-24 MED ORDER — POTASSIUM CHLORIDE 2 MEQ/ML IV SOLN
80.0000 meq | INTRAVENOUS | Status: DC
  Filled 2016-03-24: qty 40

## 2016-03-24 MED ORDER — INSULIN REGULAR HUMAN 100 UNIT/ML IJ SOLN
INTRAMUSCULAR | Status: AC
  Administered 2016-03-25: 1.5 [IU]/h via INTRAVENOUS
  Filled 2016-03-24: qty 2.5

## 2016-03-24 MED ORDER — DEXTROSE 5 % IV SOLN
1.5000 g | INTRAVENOUS | Status: AC
  Administered 2016-03-25: .75 g via INTRAVENOUS
  Administered 2016-03-25: 1.5 g via INTRAVENOUS
  Filled 2016-03-24 (×2): qty 1.5

## 2016-03-24 MED ORDER — MAGNESIUM SULFATE 50 % IJ SOLN
40.0000 meq | INTRAMUSCULAR | Status: DC
  Filled 2016-03-24: qty 10

## 2016-03-24 MED ORDER — VANCOMYCIN HCL 1000 MG IV SOLR
INTRAVENOUS | Status: AC
  Administered 2016-03-25: 1000 mL
  Filled 2016-03-24: qty 1000

## 2016-03-24 MED ORDER — PLASMA-LYTE 148 IV SOLN
INTRAVENOUS | Status: AC
  Administered 2016-03-25: 500 mL
  Filled 2016-03-24: qty 2.5

## 2016-03-24 MED ORDER — DEXMEDETOMIDINE HCL IN NACL 400 MCG/100ML IV SOLN
0.1000 ug/kg/h | INTRAVENOUS | Status: AC
  Administered 2016-03-25: .5 ug/kg/h via INTRAVENOUS
  Filled 2016-03-24: qty 100

## 2016-03-24 MED ORDER — DEXTROSE 5 % IV SOLN
750.0000 mg | INTRAVENOUS | Status: DC
  Filled 2016-03-24: qty 750

## 2016-03-24 MED ORDER — SODIUM CHLORIDE 0.9 % IV SOLN
INTRAVENOUS | Status: AC
  Administered 2016-03-25: 70 mL/h via INTRAVENOUS
  Filled 2016-03-24: qty 40

## 2016-03-24 MED ORDER — SODIUM CHLORIDE 0.9 % IV SOLN
INTRAVENOUS | Status: DC
  Filled 2016-03-24: qty 30

## 2016-03-24 MED ORDER — NITROGLYCERIN IN D5W 200-5 MCG/ML-% IV SOLN
2.0000 ug/min | INTRAVENOUS | Status: DC
  Filled 2016-03-24: qty 250

## 2016-03-24 MED ORDER — EPINEPHRINE HCL 1 MG/ML IJ SOLN
0.0000 ug/min | INTRAVENOUS | Status: DC
  Filled 2016-03-24: qty 4

## 2016-03-24 NOTE — Progress Notes (Signed)
Anesthesia chart review: Patient is a 70 year old male scheduled for minimally invasive mitral valve repair or replacement on 03/25/2016 by Dr. Roxy Manns.  History includes severe mitral regurgitation, CAD/MI s/p CABG (SVG-OM, SVG-PDA) '95 (Dr. Cyndia Bent) s/p PCI DIAG '99, dysrhythmia, HTN, chronic diastolic CHF, hyperlipidemia, diabetes mellitus type 2, OSA (wears CPAP), GERD, TIA '11, osteoarthritis, diverticulitis, anxiety, depression, PTSD ([place] War), left arm numbness, skin cancer, exertional dyspnea, laparoscopic gastric banding, umbilical hernia repair.  PCP is listed as Dr. Hulan Fess. Primary cardiologist is Dr. [name].  Meds include amiodarone, ASAS 81mg , Coreg, Zyrtec, Flonase, Lasix, Norco, metformin, nitroglycerin, KCl, Crestor, Cialis, testosterone injection, Ventolin HFA, trazodone.  PAT Vitals: BP 123/71, HR 50, RR 20, T 36.4C, O2 sat 100%. CBG 139. BMI 32. At PAT, he reported a 9-10 lb weight gain 03/22/16-03/23/16. Had not voided well on 03/22/16, but following am  Lasix dose on 03/23/16 was down 4 lbs. Had seen Dr. Roxy Manns just before his PAT visit. He says he was instructed to take an extra Lasix dose on 03/23/16. O2 sats good. CXR showed no acute CHF. Lungs clear by Dr. Guy Sandifer exam.   03/23/16 EKG: SB at 53 bpm.  01/23/16 TEE: Study Conclusions - Left ventricle: The cavity size was normal. Wall thickness was   increased in a pattern of moderate LVH. Systolic function was   normal. The estimated ejection fraction was in the range of 55%   to 60%. Wall motion was normal; there were no regional wall   motion abnormalities. - Aortic valve: There was no stenosis. - Aorta: Normal caliber aorta with mild plaque. - Mitral valve: There was eccentric, posteriorly-directed MR. Due   to eccentricity, unable to measure PISA. Suspect severe MR, wraps   around left atrium. However, there was no systolic flow reversal   in the pulmonary venous doppler pattern. There was a small area   of  prolapse of a portion of the anterior leaflet. - Left atrium: The atrium was mildly to moderately dilated. No   evidence of thrombus in the atrial cavity or appendage. - Right ventricle: The cavity size was mildly dilated. Systolic   function was normal. - Right atrium: No evidence of thrombus in the atrial cavity or   appendage. - Atrial septum: Small PFO noted. - Tricuspid valve: Peak RV-RV gradient 45 mmHg.  01/23/16 Cardiac cath: 1. No significant change compared to prior cath in 2015. The grafts are patent. There is significant disease in a large diagonal system that is similar in caliber to the continuation of the LAD. This disease is not amenable to intervention (90% distal diagonal stenosis, small caliber at that point).  2. Mildly elevated left and right heart filling pressures.  3. Normal EF with 3+ MR.  Suspect low cardiac output reading by Fick is not accurate.   Plan: I will hydrate today but increase Lasix to 40 mg po bid starting tomorrow. Will refer to Dr Cyndia Bent for surgical evaluation given suspected severe MR with symptoms.  ?MV clip candidate.   01/02/16 Nuclear stress test: Myocardial perfusion is abnormal. This is a low risk study. Overall left ventricular systolic function was abnormal. LV cavity size is mildly enlarged. Nuclear stress EF: 47%. The left ventricular ejection fraction is mildly decreased (45-54%). There are no images available for comparison.  03/23/16 Carotid U/S: Summary: - Carotid Duplex Evaluation - No significant extracranial carotid   artery stenosis demonstrated -1-39% category. Vertebral arteries   demonstrated patent antegrade flow.  03/12/16 CTA Chest/abd/pelvis: IMPRESSION: 1. Cardiomegaly  with moderate left atrial dilatation. 2. Aortic atherosclerosis, in addition to left main and 3 vessel coronary artery disease. Status post median sternotomy for CABG. 3. Ectasia of the common iliac arteries bilaterally which measure up to 14 mm in diameter.  Marked tortuosity of the pelvic arterial vessels. 4. Mild dilatation of the pulmonic trunk, which could suggest underlying pulmonary arterial hypertension. 5. Mild bilateral renal atrophy. 6. Nonobstructive calculi within the collecting system of the right kidney measuring up to 7 mm in the lower pole. No ureteral stones or findings of urinary tract obstruction are noted at this time. 7. Tiny gallstones or biliary sludge lying dependently in the gallbladder. No evidence of acute cholecystitis at this time. 8. Colonic diverticulosis without evidence of acute diverticulitis at this time. 9. Additional incidental findings, as above (see full report).  03/23/16 CXR: IMPRESSION: No active cardiopulmonary disease.  03/23/16 PFTs: FVC 3.07 (69%), FEV1 2.33 (69%), DLCOunc 18.94 (58%).   Preoperative labs noted. Cr 1.41, stable sine 07/2015. Glucose 141. H/H, PT/PTT, UA WNL. PLT 148K. A1c 7.2.   If no acute changes then I anticipate that he can proceed as planned.   George Hugh [facility] Short Stay Center/Anesthesiology Phone [phone] 03/24/2016 10:20 AM

## 2016-03-24 NOTE — Anesthesia Preprocedure Evaluation (Signed)
Anesthesia Evaluation  Patient identified by MRN, date of birth, ID band Patient awake    Reviewed: Allergy & Precautions, NPO status , Patient's Chart, lab work & pertinent test results, reviewed documented beta blocker date and time   History of Anesthesia Complications Negative for: history of anesthetic complications  Airway Mallampati: IV  TM Distance: >3 FB Neck ROM: Limited    Dental  (+) Dental Advisory Given   Pulmonary shortness of breath, sleep apnea and Continuous Positive Airway Pressure Ventilation ,    breath sounds clear to auscultation       Cardiovascular hypertension, Pt. on medications and Pt. on home beta blockers + CAD, + Past MI, + CABG, + Peripheral Vascular Disease and +CHF  + Valvular Problems/Murmurs MR  Rhythm:Regular Rate:Normal  6/17 cath: no change from 2015, normal LVF, grafts patent, some resid LAD disease which is not amienable to intervention 6/17 ECHO: normal LVF, post directed severe MR jet, small PFO 5/17 stress: low risk, no ischemia, EF 47%   Neuro/Psych Anxiety Depression TIA   GI/Hepatic Neg liver ROS, GERD  Medicated and Controlled,  Endo/Other  diabetes, Oral Hypoglycemic AgentsMorbid obesity  Renal/GU Renal InsufficiencyRenal disease (creat 1.42)     Musculoskeletal   Abdominal (+) + obese,   Peds  Hematology   Anesthesia Other Findings   Reproductive/Obstetrics                            Anesthesia Physical Anesthesia Plan  ASA: III  Anesthesia Plan: General   Post-op Pain Management:    Induction: Intravenous  Airway Management Planned: Double Lumen EBT and Video Laryngoscope Planned  Additional Equipment: Arterial line, CVP, PA Cath, 3D TEE and Ultrasound Guidance Line Placement  Intra-op Plan:   Post-operative Plan: Post-operative intubation/ventilation  Informed Consent: I have reviewed the patients History and Physical, chart,  labs and discussed the procedure including the risks, benefits and alternatives for the proposed anesthesia with the patient or authorized representative who has indicated his/her understanding and acceptance.   Dental advisory given  Plan Discussed with: CRNA and Surgeon  Anesthesia Plan Comments: (Plan routine monitors, A line, PA cath, GETA with TEE, post op ventilation)        Anesthesia Quick Evaluation

## 2016-03-25 ENCOUNTER — Inpatient Hospital Stay (HOSPITAL_COMMUNITY): Payer: Medicare Other | Admitting: Vascular Surgery

## 2016-03-25 ENCOUNTER — Encounter (HOSPITAL_COMMUNITY): Payer: Self-pay | Admitting: *Deleted

## 2016-03-25 ENCOUNTER — Encounter (HOSPITAL_COMMUNITY)
Admission: RE | Disposition: A | Discharge status: Home / Self Care | Payer: Self-pay | Source: Ambulatory Visit | Attending: Thoracic Surgery (Cardiothoracic Vascular Surgery)

## 2016-03-25 ENCOUNTER — Inpatient Hospital Stay (HOSPITAL_COMMUNITY): Payer: Medicare Other

## 2016-03-25 ENCOUNTER — Inpatient Hospital Stay (HOSPITAL_COMMUNITY)
Admission: RE | Admit: 2016-03-25 | Discharge: 2016-04-01 | DRG: 219 | Disposition: A | Discharge status: Home / Self Care | Payer: Medicare Other | Source: Ambulatory Visit | Attending: Thoracic Surgery (Cardiothoracic Vascular Surgery) | Admitting: Thoracic Surgery (Cardiothoracic Vascular Surgery)

## 2016-03-25 ENCOUNTER — Inpatient Hospital Stay (HOSPITAL_COMMUNITY): Payer: Medicare Other | Admitting: Certified Registered Nurse Anesthetist

## 2016-03-25 DIAGNOSIS — Z452 Encounter for adjustment and management of vascular access device: Secondary | ICD-10-CM | POA: Diagnosis not present

## 2016-03-25 DIAGNOSIS — I2510 Atherosclerotic heart disease of native coronary artery without angina pectoris: Secondary | ICD-10-CM | POA: Diagnosis present

## 2016-03-25 DIAGNOSIS — Z951 Presence of aortocoronary bypass graft: Secondary | ICD-10-CM | POA: Diagnosis not present

## 2016-03-25 DIAGNOSIS — E785 Hyperlipidemia, unspecified: Secondary | ICD-10-CM | POA: Diagnosis present

## 2016-03-25 DIAGNOSIS — R079 Chest pain, unspecified: Secondary | ICD-10-CM | POA: Diagnosis not present

## 2016-03-25 DIAGNOSIS — Z9889 Other specified postprocedural states: Secondary | ICD-10-CM

## 2016-03-25 DIAGNOSIS — I5032 Chronic diastolic (congestive) heart failure: Secondary | ICD-10-CM | POA: Diagnosis not present

## 2016-03-25 DIAGNOSIS — C801 Malignant (primary) neoplasm, unspecified: Secondary | ICD-10-CM | POA: Diagnosis present

## 2016-03-25 DIAGNOSIS — Z9689 Presence of other specified functional implants: Secondary | ICD-10-CM

## 2016-03-25 DIAGNOSIS — G4733 Obstructive sleep apnea (adult) (pediatric): Secondary | ICD-10-CM | POA: Diagnosis present

## 2016-03-25 DIAGNOSIS — J9811 Atelectasis: Secondary | ICD-10-CM | POA: Diagnosis not present

## 2016-03-25 DIAGNOSIS — I10 Essential (primary) hypertension: Secondary | ICD-10-CM | POA: Diagnosis present

## 2016-03-25 DIAGNOSIS — Z9884 Bariatric surgery status: Secondary | ICD-10-CM | POA: Diagnosis not present

## 2016-03-25 DIAGNOSIS — I110 Hypertensive heart disease with heart failure: Secondary | ICD-10-CM | POA: Diagnosis present

## 2016-03-25 DIAGNOSIS — T8182XA Emphysema (subcutaneous) resulting from a procedure, initial encounter: Secondary | ICD-10-CM | POA: Diagnosis not present

## 2016-03-25 DIAGNOSIS — I252 Old myocardial infarction: Secondary | ICD-10-CM | POA: Diagnosis not present

## 2016-03-25 DIAGNOSIS — I25118 Atherosclerotic heart disease of native coronary artery with other forms of angina pectoris: Secondary | ICD-10-CM | POA: Diagnosis present

## 2016-03-25 DIAGNOSIS — R69 Illness, unspecified: Secondary | ICD-10-CM | POA: Diagnosis present

## 2016-03-25 DIAGNOSIS — J439 Emphysema, unspecified: Secondary | ICD-10-CM | POA: Diagnosis not present

## 2016-03-25 DIAGNOSIS — Z4682 Encounter for fitting and adjustment of non-vascular catheter: Secondary | ICD-10-CM | POA: Diagnosis not present

## 2016-03-25 DIAGNOSIS — I340 Nonrheumatic mitral (valve) insufficiency: Secondary | ICD-10-CM | POA: Diagnosis present

## 2016-03-25 DIAGNOSIS — K219 Gastro-esophageal reflux disease without esophagitis: Secondary | ICD-10-CM | POA: Diagnosis present

## 2016-03-25 DIAGNOSIS — Z8673 Personal history of transient ischemic attack (TIA), and cerebral infarction without residual deficits: Secondary | ICD-10-CM | POA: Diagnosis not present

## 2016-03-25 DIAGNOSIS — Z6832 Body mass index (BMI) 32.0-32.9, adult: Secondary | ICD-10-CM | POA: Diagnosis not present

## 2016-03-25 DIAGNOSIS — Q211 Atrial septal defect: Secondary | ICD-10-CM | POA: Diagnosis not present

## 2016-03-25 DIAGNOSIS — E876 Hypokalemia: Secondary | ICD-10-CM | POA: Diagnosis not present

## 2016-03-25 DIAGNOSIS — I509 Heart failure, unspecified: Secondary | ICD-10-CM

## 2016-03-25 DIAGNOSIS — J939 Pneumothorax, unspecified: Secondary | ICD-10-CM | POA: Diagnosis not present

## 2016-03-25 DIAGNOSIS — E1151 Type 2 diabetes mellitus with diabetic peripheral angiopathy without gangrene: Secondary | ICD-10-CM | POA: Diagnosis present

## 2016-03-25 DIAGNOSIS — R0602 Shortness of breath: Secondary | ICD-10-CM | POA: Diagnosis not present

## 2016-03-25 DIAGNOSIS — Z955 Presence of coronary angioplasty implant and graft: Secondary | ICD-10-CM | POA: Diagnosis not present

## 2016-03-25 DIAGNOSIS — Z96651 Presence of right artificial knee joint: Secondary | ICD-10-CM | POA: Diagnosis present

## 2016-03-25 DIAGNOSIS — I4891 Unspecified atrial fibrillation: Secondary | ICD-10-CM | POA: Diagnosis present

## 2016-03-25 DIAGNOSIS — J95811 Postprocedural pneumothorax: Secondary | ICD-10-CM | POA: Diagnosis not present

## 2016-03-25 DIAGNOSIS — I25810 Atherosclerosis of coronary artery bypass graft(s) without angina pectoris: Secondary | ICD-10-CM | POA: Diagnosis present

## 2016-03-25 DIAGNOSIS — I511 Rupture of chordae tendineae, not elsewhere classified: Secondary | ICD-10-CM | POA: Diagnosis present

## 2016-03-25 DIAGNOSIS — E669 Obesity, unspecified: Secondary | ICD-10-CM | POA: Diagnosis present

## 2016-03-25 DIAGNOSIS — T797XXA Traumatic subcutaneous emphysema, initial encounter: Secondary | ICD-10-CM

## 2016-03-25 DIAGNOSIS — D62 Acute posthemorrhagic anemia: Secondary | ICD-10-CM | POA: Diagnosis not present

## 2016-03-25 DIAGNOSIS — D696 Thrombocytopenia, unspecified: Secondary | ICD-10-CM | POA: Diagnosis not present

## 2016-03-25 DIAGNOSIS — I517 Cardiomegaly: Secondary | ICD-10-CM | POA: Diagnosis not present

## 2016-03-25 DIAGNOSIS — E668 Other obesity: Secondary | ICD-10-CM | POA: Diagnosis not present

## 2016-03-25 DIAGNOSIS — E7800 Pure hypercholesterolemia, unspecified: Secondary | ICD-10-CM | POA: Diagnosis present

## 2016-03-25 HISTORY — DX: Other specified postprocedural states: Z98.890

## 2016-03-25 HISTORY — PX: MITRAL VALVE REPAIR: SHX2039

## 2016-03-25 HISTORY — PX: TEE WITHOUT CARDIOVERSION: SHX5443

## 2016-03-25 LAB — POCT I-STAT, CHEM 8
BUN: 14 mg/dL (ref 6–20)
BUN: 13 mg/dL (ref 6–20)
BUN: 14 mg/dL (ref 6–20)
BUN: 15 mg/dL (ref 6–20)
BUN: 14 mg/dL (ref 6–20)
BUN: 13 mg/dL (ref 6–20)
BUN: 13 mg/dL (ref 6–20)
CALCIUM ION: 1.17 mmol/L (ref 1.12–1.23)
CHLORIDE: 101 mmol/L (ref 101–111)
CHLORIDE: 100 mmol/L — AB (ref 101–111)
CHLORIDE: 100 mmol/L — AB (ref 101–111)
CHLORIDE: 107 mmol/L (ref 101–111)
CREATININE: 1.2 mg/dL (ref 0.61–1.24)
CREATININE: 1.3 mg/dL — AB (ref 0.61–1.24)
CREATININE: 1.4 mg/dL — AB (ref 0.61–1.24)
Calcium, Ion: 1.23 mmol/L (ref 1.12–1.23)
Calcium, Ion: 1.09 mmol/L — ABNORMAL LOW (ref 1.12–1.23)
Calcium, Ion: 1.24 mmol/L — ABNORMAL HIGH (ref 1.12–1.23)
Calcium, Ion: 1.03 mmol/L — ABNORMAL LOW (ref 1.12–1.23)
Calcium, Ion: 1.09 mmol/L — ABNORMAL LOW (ref 1.12–1.23)
Calcium, Ion: 1.16 mmol/L (ref 1.12–1.23)
Chloride: 101 mmol/L (ref 101–111)
Chloride: 101 mmol/L (ref 101–111)
Chloride: 100 mmol/L — ABNORMAL LOW (ref 101–111)
Creatinine, Ser: 1.3 mg/dL — ABNORMAL HIGH (ref 0.61–1.24)
Creatinine, Ser: 1.4 mg/dL — ABNORMAL HIGH (ref 0.61–1.24)
Creatinine, Ser: 1.2 mg/dL (ref 0.61–1.24)
Creatinine, Ser: 1.3 mg/dL — ABNORMAL HIGH (ref 0.61–1.24)
GLUCOSE: 123 mg/dL — AB (ref 65–99)
Glucose, Bld: 133 mg/dL — ABNORMAL HIGH (ref 65–99)
Glucose, Bld: 129 mg/dL — ABNORMAL HIGH (ref 65–99)
Glucose, Bld: 154 mg/dL — ABNORMAL HIGH (ref 65–99)
Glucose, Bld: 180 mg/dL — ABNORMAL HIGH (ref 65–99)
Glucose, Bld: 158 mg/dL — ABNORMAL HIGH (ref 65–99)
Glucose, Bld: 111 mg/dL — ABNORMAL HIGH (ref 65–99)
HEMATOCRIT: 35 % — AB (ref 39.0–52.0)
HEMATOCRIT: 31 % — AB (ref 39.0–52.0)
HEMATOCRIT: 35 % — AB (ref 39.0–52.0)
HEMATOCRIT: 30 % — AB (ref 39.0–52.0)
HEMATOCRIT: 26 % — AB (ref 39.0–52.0)
HEMATOCRIT: 27 % — AB (ref 39.0–52.0)
HEMATOCRIT: 34 % — AB (ref 39.0–52.0)
HEMOGLOBIN: 11.9 g/dL — AB (ref 13.0–17.0)
HEMOGLOBIN: 8.8 g/dL — AB (ref 13.0–17.0)
HEMOGLOBIN: 9.2 g/dL — AB (ref 13.0–17.0)
Hemoglobin: 11.9 g/dL — ABNORMAL LOW (ref 13.0–17.0)
Hemoglobin: 10.5 g/dL — ABNORMAL LOW (ref 13.0–17.0)
Hemoglobin: 10.2 g/dL — ABNORMAL LOW (ref 13.0–17.0)
Hemoglobin: 11.6 g/dL — ABNORMAL LOW (ref 13.0–17.0)
POTASSIUM: 3.6 mmol/L (ref 3.5–5.1)
POTASSIUM: 3.7 mmol/L (ref 3.5–5.1)
POTASSIUM: 3.6 mmol/L (ref 3.5–5.1)
POTASSIUM: 4.1 mmol/L (ref 3.5–5.1)
Potassium: 3.4 mmol/L — ABNORMAL LOW (ref 3.5–5.1)
Potassium: 4.6 mmol/L (ref 3.5–5.1)
Potassium: 4.4 mmol/L (ref 3.5–5.1)
SODIUM: 140 mmol/L (ref 135–145)
SODIUM: 137 mmol/L (ref 135–145)
SODIUM: 139 mmol/L (ref 135–145)
SODIUM: 138 mmol/L (ref 135–145)
SODIUM: 141 mmol/L (ref 135–145)
Sodium: 137 mmol/L (ref 135–145)
Sodium: 139 mmol/L (ref 135–145)
TCO2: 27 mmol/L (ref 0–100)
TCO2: 24 mmol/L (ref 0–100)
TCO2: 25 mmol/L (ref 0–100)
TCO2: 25 mmol/L (ref 0–100)
TCO2: 27 mmol/L (ref 0–100)
TCO2: 24 mmol/L (ref 0–100)
TCO2: 23 mmol/L (ref 0–100)

## 2016-03-25 LAB — POCT I-STAT 4, (NA,K, GLUC, HGB,HCT)
GLUCOSE: 99 mg/dL (ref 65–99)
HCT: 35 % — ABNORMAL LOW (ref 39.0–52.0)
Hemoglobin: 11.9 g/dL — ABNORMAL LOW (ref 13.0–17.0)
POTASSIUM: 3.4 mmol/L — AB (ref 3.5–5.1)
SODIUM: 140 mmol/L (ref 135–145)

## 2016-03-25 LAB — POCT I-STAT 3, ART BLOOD GAS (G3+)
ACID-BASE DEFICIT: 3 mmol/L — AB (ref 0.0–2.0)
ACID-BASE DEFICIT: 6 mmol/L — AB (ref 0.0–2.0)
Acid-base deficit: 4 mmol/L — ABNORMAL HIGH (ref 0.0–2.0)
Acid-base deficit: 5 mmol/L — ABNORMAL HIGH (ref 0.0–2.0)
BICARBONATE: 21.1 meq/L (ref 20.0–24.0)
Bicarbonate: 22.5 mEq/L (ref 20.0–24.0)
Bicarbonate: 22 mEq/L (ref 20.0–24.0)
Bicarbonate: 20 mEq/L (ref 20.0–24.0)
O2 SAT: 100 %
O2 SAT: 94 %
O2 SAT: 98 %
O2 Saturation: 95 %
PCO2 ART: 40.4 mmHg (ref 35.0–45.0)
PCO2 ART: 42.8 mmHg (ref 35.0–45.0)
PCO2 ART: 40.5 mmHg (ref 35.0–45.0)
PCO2 ART: 41.5 mmHg (ref 35.0–45.0)
PH ART: 7.354 (ref 7.350–7.450)
PH ART: 7.314 — AB (ref 7.350–7.450)
PH ART: 7.3 — AB (ref 7.350–7.450)
PH ART: 7.317 — AB (ref 7.350–7.450)
PO2 ART: 112 mmHg — AB (ref 80.0–100.0)
PO2 ART: 83 mmHg (ref 80.0–100.0)
Patient temperature: 36.6
Patient temperature: 37.5
TCO2: 24 mmol/L (ref 0–100)
TCO2: 23 mmol/L (ref 0–100)
TCO2: 21 mmol/L (ref 0–100)
TCO2: 22 mmol/L (ref 0–100)
pO2, Arterial: 314 mmHg — ABNORMAL HIGH (ref 80.0–100.0)
pO2, Arterial: 74 mmHg — ABNORMAL LOW (ref 80.0–100.0)

## 2016-03-25 LAB — CBC
HCT: 37.8 % — ABNORMAL LOW (ref 39.0–52.0)
HEMATOCRIT: 36.4 % — AB (ref 39.0–52.0)
HEMOGLOBIN: 13.3 g/dL (ref 13.0–17.0)
HEMOGLOBIN: 12.3 g/dL — AB (ref 13.0–17.0)
MCH: 31.9 pg (ref 26.0–34.0)
MCH: 30.8 pg (ref 26.0–34.0)
MCHC: 35.2 g/dL (ref 30.0–36.0)
MCHC: 33.8 g/dL (ref 30.0–36.0)
MCV: 90.6 fL (ref 78.0–100.0)
MCV: 91 fL (ref 78.0–100.0)
PLATELETS: 106 10*3/uL — AB (ref 150–400)
Platelets: 119 10*3/uL — ABNORMAL LOW (ref 150–400)
RBC: 4.17 MIL/uL — AB (ref 4.22–5.81)
RBC: 4 MIL/uL — ABNORMAL LOW (ref 4.22–5.81)
RDW: 13.2 % (ref 11.5–15.5)
RDW: 13.1 % (ref 11.5–15.5)
WBC: 11.7 10*3/uL — ABNORMAL HIGH (ref 4.0–10.5)
WBC: 12.7 10*3/uL — AB (ref 4.0–10.5)

## 2016-03-25 LAB — GLUCOSE, CAPILLARY
GLUCOSE-CAPILLARY: 79 mg/dL (ref 65–99)
Glucose-Capillary: 136 mg/dL — ABNORMAL HIGH (ref 65–99)
Glucose-Capillary: 83 mg/dL (ref 65–99)
Glucose-Capillary: 122 mg/dL — ABNORMAL HIGH (ref 65–99)

## 2016-03-25 LAB — APTT: aPTT: 35 seconds (ref 24–36)

## 2016-03-25 LAB — HEMOGLOBIN AND HEMATOCRIT, BLOOD
HCT: 26.6 % — ABNORMAL LOW (ref 39.0–52.0)
Hemoglobin: 9.1 g/dL — ABNORMAL LOW (ref 13.0–17.0)

## 2016-03-25 LAB — PLATELET COUNT: Platelets: 121 10*3/uL — ABNORMAL LOW (ref 150–400)

## 2016-03-25 LAB — PROTIME-INR
INR: 1.39
PROTHROMBIN TIME: 17.1 s — AB (ref 11.4–15.2)

## 2016-03-25 SURGERY — REPAIR, MITRAL VALVE, MINIMALLY INVASIVE
Anesthesia: General | Site: Chest | Laterality: Right

## 2016-03-25 MED ORDER — METOPROLOL TARTRATE 5 MG/5ML IV SOLN: 2.5000 mg | INTRAVENOUS | Status: DC | PRN

## 2016-03-25 MED ORDER — MORPHINE SULFATE (PF) 2 MG/ML IV SOLN: 1.0000 mg | INTRAVENOUS | Status: DC | PRN

## 2016-03-25 MED ORDER — POTASSIUM CHLORIDE 10 MEQ/50ML IV SOLN
10.0000 meq | INTRAVENOUS | Status: AC
  Administered 2016-03-25 (×3): 10 meq via INTRAVENOUS

## 2016-03-25 MED ORDER — MORPHINE SULFATE (PF) 2 MG/ML IV SOLN
2.0000 mg | INTRAVENOUS | Status: DC | PRN
  Administered 2016-03-25: 4 mg via INTRAVENOUS
  Administered 2016-03-26 (×2): 2 mg via INTRAVENOUS
  Filled 2016-03-25: qty 1
  Filled 2016-03-25: qty 2
  Filled 2016-03-25: qty 1

## 2016-03-25 MED ORDER — FAMOTIDINE IN NACL 20-0.9 MG/50ML-% IV SOLN
20.0000 mg | Freq: Two times a day (BID) | INTRAVENOUS | Status: DC
  Administered 2016-03-25: 20 mg via INTRAVENOUS

## 2016-03-25 MED ORDER — METOPROLOL TARTRATE 25 MG/10 ML ORAL SUSPENSION
12.5000 mg | Freq: Two times a day (BID) | ORAL | Status: DC
Start: 2016-03-25 — End: 2016-03-26

## 2016-03-25 MED ORDER — CHLORHEXIDINE GLUCONATE 0.12 % MT SOLN
15.0000 mL | OROMUCOSAL | Status: AC
  Administered 2016-03-25: 15 mL via OROMUCOSAL

## 2016-03-25 MED ORDER — DOCUSATE SODIUM 100 MG PO CAPS
200.0000 mg | ORAL_CAPSULE | Freq: Every day | ORAL | Status: DC
  Administered 2016-03-26 – 2016-04-01 (×6): 200 mg via ORAL
  Filled 2016-03-25 (×6): qty 2

## 2016-03-25 MED ORDER — MIDAZOLAM HCL 5 MG/5ML IJ SOLN
INTRAMUSCULAR | Status: DC | PRN
  Administered 2016-03-25: 1 mg via INTRAVENOUS
  Administered 2016-03-25 (×2): 2 mg via INTRAVENOUS
  Administered 2016-03-25: 1 mg via INTRAVENOUS
  Administered 2016-03-25: 4 mg via INTRAVENOUS

## 2016-03-25 MED ORDER — ACETAMINOPHEN 160 MG/5ML PO SOLN: 1000.0000 mg | Freq: Four times a day (QID) | ORAL | Status: DC

## 2016-03-25 MED ORDER — ACETAMINOPHEN 650 MG RE SUPP
650.0000 mg | Freq: Once | RECTAL | Status: AC
  Administered 2016-03-25: 650 mg via RECTAL

## 2016-03-25 MED ORDER — SODIUM CHLORIDE 0.9 % IV SOLN: INTRAVENOUS | Status: DC

## 2016-03-25 MED ORDER — OXYCODONE HCL 5 MG PO TABS
5.0000 mg | ORAL_TABLET | ORAL | Status: DC | PRN
  Administered 2016-03-26 – 2016-03-27 (×3): 10 mg via ORAL
  Administered 2016-03-27: 5 mg via ORAL
  Administered 2016-03-27 – 2016-04-01 (×11): 10 mg via ORAL
  Filled 2016-03-25 (×16): qty 2

## 2016-03-25 MED ORDER — METOPROLOL TARTRATE 12.5 MG HALF TABLET: 12.5000 mg | ORAL_TABLET | Freq: Two times a day (BID) | ORAL | Status: DC

## 2016-03-25 MED ORDER — LACTATED RINGERS IV SOLN: 500.0000 mL | Freq: Once | INTRAVENOUS | Status: DC | PRN

## 2016-03-25 MED ORDER — ACETAMINOPHEN 160 MG/5ML PO SOLN: 650.0000 mg | Freq: Once | ORAL | Status: AC

## 2016-03-25 MED ORDER — INSULIN REGULAR BOLUS VIA INFUSION
0.0000 [IU] | Freq: Three times a day (TID) | INTRAVENOUS | Status: DC
  Filled 2016-03-25: qty 10

## 2016-03-25 MED ORDER — ACETAMINOPHEN 500 MG PO TABS
1000.0000 mg | ORAL_TABLET | Freq: Four times a day (QID) | ORAL | Status: AC
  Administered 2016-03-26 – 2016-03-30 (×17): 1000 mg via ORAL
  Filled 2016-03-25 (×16): qty 2

## 2016-03-25 MED ORDER — NITROGLYCERIN IN D5W 200-5 MCG/ML-% IV SOLN: 0.0000 ug/min | INTRAVENOUS | Status: DC

## 2016-03-25 MED ORDER — DEXMEDETOMIDINE HCL IN NACL 200 MCG/50ML IV SOLN
0.0000 ug/kg/h | INTRAVENOUS | Status: DC
  Filled 2016-03-25 (×2): qty 50

## 2016-03-25 MED ORDER — ROCURONIUM BROMIDE 100 MG/10ML IV SOLN
INTRAVENOUS | Status: DC | PRN
  Administered 2016-03-25: 50 mg via INTRAVENOUS
  Administered 2016-03-25: 30 mg via INTRAVENOUS
  Administered 2016-03-25: 20 mg via INTRAVENOUS

## 2016-03-25 MED ORDER — 0.9 % SODIUM CHLORIDE (POUR BTL) OPTIME
TOPICAL | Status: DC | PRN
  Administered 2016-03-25: 5000 mL

## 2016-03-25 MED ORDER — PROTAMINE SULFATE 10 MG/ML IV SOLN
INTRAVENOUS | Status: DC | PRN
  Administered 2016-03-25: 180 mg via INTRAVENOUS
  Administered 2016-03-25: 40 mg via INTRAVENOUS

## 2016-03-25 MED ORDER — SODIUM CHLORIDE 0.9 % IV SOLN
INTRAVENOUS | Status: DC
  Administered 2016-03-25: 2.5 [IU]/h via INTRAVENOUS
  Filled 2016-03-25: qty 2.5

## 2016-03-25 MED ORDER — POTASSIUM CHLORIDE 10 MEQ/50ML IV SOLN
10.0000 meq | Freq: Once | INTRAVENOUS | Status: AC
  Administered 2016-03-25: 10 meq via INTRAVENOUS

## 2016-03-25 MED ORDER — MIDAZOLAM HCL 2 MG/2ML IJ SOLN: 2.0000 mg | INTRAMUSCULAR | Status: DC | PRN

## 2016-03-25 MED ORDER — ASPIRIN EC 325 MG PO TBEC
325.0000 mg | DELAYED_RELEASE_TABLET | Freq: Every day | ORAL | Status: DC
  Administered 2016-03-26: 325 mg via ORAL
  Filled 2016-03-25: qty 1

## 2016-03-25 MED ORDER — FENTANYL CITRATE (PF) 250 MCG/5ML IJ SOLN
INTRAMUSCULAR | Status: AC
  Filled 2016-03-25: qty 25

## 2016-03-25 MED ORDER — LACTATED RINGERS IV SOLN
INTRAVENOUS | Status: DC | PRN
  Administered 2016-03-25: 09:00:00 via INTRAVENOUS

## 2016-03-25 MED ORDER — FENTANYL CITRATE (PF) 100 MCG/2ML IJ SOLN
INTRAMUSCULAR | Status: DC | PRN
  Administered 2016-03-25: 300 ug via INTRAVENOUS
  Administered 2016-03-25: 400 ug via INTRAVENOUS
  Administered 2016-03-25: 150 ug via INTRAVENOUS
  Administered 2016-03-25: 50 ug via INTRAVENOUS
  Administered 2016-03-25: 150 ug via INTRAVENOUS
  Administered 2016-03-25 (×2): 100 ug via INTRAVENOUS

## 2016-03-25 MED ORDER — MIDAZOLAM HCL 10 MG/2ML IJ SOLN
INTRAMUSCULAR | Status: AC
  Filled 2016-03-25: qty 2

## 2016-03-25 MED ORDER — BISACODYL 5 MG PO TBEC
10.0000 mg | DELAYED_RELEASE_TABLET | Freq: Every day | ORAL | Status: DC
  Administered 2016-03-26 – 2016-04-01 (×6): 10 mg via ORAL
  Filled 2016-03-25 (×6): qty 2

## 2016-03-25 MED ORDER — SODIUM BICARBONATE 8.4 % IV SOLN
50.0000 meq | Freq: Once | INTRAVENOUS | Status: AC
  Administered 2016-03-25: 50 meq via INTRAVENOUS

## 2016-03-25 MED ORDER — MAGNESIUM SULFATE 4 GM/100ML IV SOLN
4.0000 g | Freq: Once | INTRAVENOUS | Status: AC
  Administered 2016-03-25: 4 g via INTRAVENOUS
  Filled 2016-03-25: qty 100

## 2016-03-25 MED ORDER — CHLORHEXIDINE GLUCONATE 0.12 % MT SOLN
15.0000 mL | Freq: Once | OROMUCOSAL | Status: AC
  Administered 2016-03-25: 15 mL via OROMUCOSAL
  Filled 2016-03-25: qty 15

## 2016-03-25 MED ORDER — PROPOFOL 10 MG/ML IV BOLUS
INTRAVENOUS | Status: DC | PRN
  Administered 2016-03-25: 30 mg via INTRAVENOUS

## 2016-03-25 MED ORDER — SODIUM CHLORIDE 0.45 % IV SOLN: INTRAVENOUS | Status: DC | PRN

## 2016-03-25 MED ORDER — ONDANSETRON HCL 4 MG/2ML IJ SOLN: 4.0000 mg | Freq: Four times a day (QID) | INTRAMUSCULAR | Status: DC | PRN

## 2016-03-25 MED ORDER — METOPROLOL TARTRATE 12.5 MG HALF TABLET
12.5000 mg | ORAL_TABLET | Freq: Once | ORAL | Status: DC
Start: 2016-03-25 — End: 2016-03-25

## 2016-03-25 MED ORDER — PHENYLEPHRINE HCL 10 MG/ML IJ SOLN
0.0000 ug/min | INTRAMUSCULAR | Status: DC
  Filled 2016-03-25: qty 2

## 2016-03-25 MED ORDER — ARTIFICIAL TEARS OP OINT
TOPICAL_OINTMENT | OPHTHALMIC | Status: DC | PRN
  Administered 2016-03-25: 1 via OPHTHALMIC

## 2016-03-25 MED ORDER — TRAMADOL HCL 50 MG PO TABS
50.0000 mg | ORAL_TABLET | ORAL | Status: DC | PRN
  Administered 2016-03-31: 50 mg via ORAL
  Filled 2016-03-25: qty 1

## 2016-03-25 MED ORDER — LACTATED RINGERS IV SOLN: INTRAVENOUS | Status: DC

## 2016-03-25 MED ORDER — VECURONIUM BROMIDE 10 MG IV SOLR
INTRAVENOUS | Status: DC | PRN
Start: 2016-03-25 — End: 2016-03-25
  Administered 2016-03-25 (×2): 4 mg via INTRAVENOUS

## 2016-03-25 MED ORDER — LACTATED RINGERS IV SOLN
INTRAVENOUS | Status: DC
  Administered 2016-03-26: 13:00:00 via INTRAVENOUS

## 2016-03-25 MED ORDER — HEPARIN SODIUM (PORCINE) 1000 UNIT/ML IJ SOLN
INTRAMUSCULAR | Status: DC | PRN
Start: 2016-03-25 — End: 2016-03-25
  Administered 2016-03-25: 23000 [IU] via INTRAVENOUS

## 2016-03-25 MED ORDER — SODIUM CHLORIDE 0.9 % IV SOLN
INTRAVENOUS | Status: DC
  Administered 2016-03-25: 16:00:00 via INTRAVENOUS

## 2016-03-25 MED ORDER — PROPOFOL 10 MG/ML IV BOLUS
INTRAVENOUS | Status: AC
  Filled 2016-03-25: qty 20

## 2016-03-25 MED ORDER — SODIUM CHLORIDE 0.9% FLUSH: 3.0000 mL | INTRAVENOUS | Status: DC | PRN

## 2016-03-25 MED ORDER — BISACODYL 10 MG RE SUPP: 10.0000 mg | Freq: Every day | RECTAL | Status: DC

## 2016-03-25 MED ORDER — CHLORHEXIDINE GLUCONATE 4 % EX LIQD: 30.0000 mL | CUTANEOUS | Status: DC

## 2016-03-25 MED ORDER — SODIUM CHLORIDE 0.9 % IV SOLN
250.0000 mL | INTRAVENOUS | Status: DC
  Administered 2016-03-26: 250 mL via INTRAVENOUS

## 2016-03-25 MED ORDER — DEXTROSE 5 % IV SOLN
1.5000 g | Freq: Two times a day (BID) | INTRAVENOUS | Status: AC
  Administered 2016-03-26 – 2016-03-27 (×4): 1.5 g via INTRAVENOUS
  Filled 2016-03-25 (×4): qty 1.5

## 2016-03-25 MED ORDER — VANCOMYCIN HCL IN DEXTROSE 1-5 GM/200ML-% IV SOLN
1000.0000 mg | Freq: Once | INTRAVENOUS | Status: AC
  Administered 2016-03-25: 1000 mg via INTRAVENOUS
  Filled 2016-03-25: qty 200

## 2016-03-25 MED ORDER — PANTOPRAZOLE SODIUM 40 MG PO TBEC
40.0000 mg | DELAYED_RELEASE_TABLET | Freq: Every day | ORAL | Status: DC
  Administered 2016-03-27 – 2016-04-01 (×6): 40 mg via ORAL
  Filled 2016-03-25 (×6): qty 1

## 2016-03-25 MED ORDER — ALBUMIN HUMAN 5 % IV SOLN
250.0000 mL | INTRAVENOUS | Status: DC | PRN
  Administered 2016-03-25 (×3): 250 mL via INTRAVENOUS
  Filled 2016-03-25: qty 250

## 2016-03-25 MED ORDER — SODIUM CHLORIDE 0.9% FLUSH
3.0000 mL | Freq: Two times a day (BID) | INTRAVENOUS | Status: DC
  Administered 2016-03-26 – 2016-03-31 (×10): 3 mL via INTRAVENOUS

## 2016-03-25 MED ORDER — ASPIRIN 81 MG PO CHEW: 324.0000 mg | CHEWABLE_TABLET | Freq: Every day | ORAL | Status: DC

## 2016-03-25 MED ORDER — SODIUM CHLORIDE 0.9 % IR SOLN
Status: DC | PRN
  Administered 2016-03-25: 3000 mL

## 2016-03-25 SURGICAL SUPPLY — 115 items
ADAPTER CARDIO PERF ANTE/RETRO (ADAPTER) ×3 IMPLANT
ADH SKN CLS APL DERMABOND .7 (GAUZE/BANDAGES/DRESSINGS) ×2
ADPR PRFSN 84XANTGRD RTRGD (ADAPTER) ×2
BAG DECANTER FOR FLEXI CONT (MISCELLANEOUS) ×6 IMPLANT
BLADE SURG 11 STRL SS (BLADE) ×3 IMPLANT
CANISTER SUCTION 2500CC (MISCELLANEOUS) ×6 IMPLANT
CANNULA FEM VENOUS REMOTE 22FR (CANNULA) IMPLANT
CANNULA FEMORAL ART 14 SM (MISCELLANEOUS) ×3 IMPLANT
CANNULA GUNDRY RCSP 15FR (MISCELLANEOUS) ×3 IMPLANT
CANNULA OPTISITE PERFUSION 16F (CANNULA) IMPLANT
CANNULA OPTISITE PERFUSION 18F (CANNULA) ×1 IMPLANT
CANNULA SUMP PERICARDIAL (CANNULA) ×6 IMPLANT
CATH KIT ON Q 5IN SLV (PAIN MANAGEMENT) IMPLANT
CELLS DAT CNTRL 66122 CELL SVR (MISCELLANEOUS) ×2 IMPLANT
CONN ST 1/4X3/8  BEN (MISCELLANEOUS) ×2
CONN ST 1/4X3/8 BEN (MISCELLANEOUS) ×4 IMPLANT
CONNECTOR 1/2X3/8X1/2 3 WAY (MISCELLANEOUS) ×1
CONNECTOR 1/2X3/8X1/2 3WAY (MISCELLANEOUS) ×2 IMPLANT
CONT SPEC 4OZ CLIKSEAL STRL BL (MISCELLANEOUS) ×2 IMPLANT
CONT SPEC STER OR (MISCELLANEOUS) ×3 IMPLANT
COVER BACK TABLE 24X17X13 BIG (DRAPES) ×3 IMPLANT
CRADLE DONUT ADULT HEAD (MISCELLANEOUS) ×3 IMPLANT
DERMABOND ADVANCED (GAUZE/BANDAGES/DRESSINGS) ×1
DERMABOND ADVANCED .7 DNX12 (GAUZE/BANDAGES/DRESSINGS) ×4 IMPLANT
DEVICE PMI PUNCTURE CLOSURE (MISCELLANEOUS) ×3 IMPLANT
DEVICE SUT CK QUICK LOAD MINI (Prosthesis & Implant Heart) ×2 IMPLANT
DEVICE TROCAR PUNCTURE CLOSURE (ENDOMECHANICALS) ×3 IMPLANT
DRAIN CHANNEL 28F RND 3/8 FF (WOUND CARE) ×6 IMPLANT
DRAPE BILATERAL SPLIT (DRAPES) ×3 IMPLANT
DRAPE C-ARM 42X72 X-RAY (DRAPES) ×3 IMPLANT
DRAPE CV SPLIT W-CLR ANES SCRN (DRAPES) ×3 IMPLANT
DRAPE INCISE IOBAN 66X45 STRL (DRAPES) ×10 IMPLANT
DRAPE SLUSH/WARMER DISC (DRAPES) ×3 IMPLANT
DRSG AQUACEL AG ADV 3.5X 6 (GAUZE/BANDAGES/DRESSINGS) ×1 IMPLANT
DRSG COVADERM 4X8 (GAUZE/BANDAGES/DRESSINGS) ×3 IMPLANT
ELECT REM PT RETURN 9FT ADLT (ELECTROSURGICAL) ×6
ELECTRODE REM PT RTRN 9FT ADLT (ELECTROSURGICAL) ×4 IMPLANT
FELT TEFLON 1X6 (MISCELLANEOUS) ×3 IMPLANT
FEMORAL VENOUS CANN RAP (CANNULA) IMPLANT
FLUID NSS /IRRIG 3000 ML XXX (IV SOLUTION) ×1 IMPLANT
GAUZE SPONGE 4X4 12PLY STRL (GAUZE/BANDAGES/DRESSINGS) ×1 IMPLANT
GLOVE BIO SURGEON STRL SZ7.5 (GLOVE) ×1 IMPLANT
GLOVE BIOGEL M 6.5 STRL (GLOVE) ×3 IMPLANT
GLOVE BIOGEL M 7.0 STRL (GLOVE) ×3 IMPLANT
GLOVE BIOGEL PI IND STRL 6.5 (GLOVE) IMPLANT
GLOVE BIOGEL PI IND STRL 7.0 (GLOVE) IMPLANT
GLOVE BIOGEL PI INDICATOR 6.5 (GLOVE) ×1
GLOVE BIOGEL PI INDICATOR 7.0 (GLOVE) ×2
GLOVE ORTHO TXT STRL SZ7.5 (GLOVE) ×9 IMPLANT
GOWN STRL REUS W/ TWL LRG LVL3 (GOWN DISPOSABLE) ×8 IMPLANT
GOWN STRL REUS W/ TWL XL LVL3 (GOWN DISPOSABLE) IMPLANT
GOWN STRL REUS W/TWL LRG LVL3 (GOWN DISPOSABLE) ×24
GOWN STRL REUS W/TWL XL LVL3 (GOWN DISPOSABLE) ×3
KIT BASIN OR (CUSTOM PROCEDURE TRAY) ×3 IMPLANT
KIT DILATOR VASC 18G NDL (KITS) ×3 IMPLANT
KIT DRAINAGE VACCUM ASSIST (KITS) ×1 IMPLANT
KIT ROOM TURNOVER OR (KITS) ×3 IMPLANT
KIT SUCTION CATH 14FR (SUCTIONS) ×3 IMPLANT
KIT SUT CK MINI COMBO 4X17 (Prosthesis & Implant Heart) ×1 IMPLANT
LEAD PACING MYOCARDI (MISCELLANEOUS) ×3 IMPLANT
LINE VENT (MISCELLANEOUS) ×1 IMPLANT
NDL AORTIC ROOT 14G 7F (CATHETERS) ×2 IMPLANT
NEEDLE AORTIC ROOT 14G 7F (CATHETERS) ×3 IMPLANT
NS IRRIG 1000ML POUR BTL (IV SOLUTION) ×15 IMPLANT
PACK OPEN HEART (CUSTOM PROCEDURE TRAY) ×3 IMPLANT
PAD ARMBOARD 7.5X6 YLW CONV (MISCELLANEOUS) ×6 IMPLANT
PAD ELECT DEFIB RADIOL ZOLL (MISCELLANEOUS) ×3 IMPLANT
RETRACTOR TRL SOFT TISSUE LG (INSTRUMENTS) IMPLANT
RETRACTOR TRM SOFT TISSUE 7.5 (INSTRUMENTS) IMPLANT
RETRACTOR WND ALEXIS 18 MED (MISCELLANEOUS) IMPLANT
RING MITRAL MEMO 3D 26MM SMD26 (Prosthesis & Implant Heart) ×1 IMPLANT
RTRCTR WOUND ALEXIS 18CM MED (MISCELLANEOUS) ×3
SET CANNULATION TOURNIQUET (MISCELLANEOUS) ×3 IMPLANT
SET CARDIOPLEGIA MPS 5001102 (MISCELLANEOUS) ×1 IMPLANT
SET IRRIG TUBING LAPAROSCOPIC (IRRIGATION / IRRIGATOR) ×3 IMPLANT
SOLUTION ANTI FOG 6CC (MISCELLANEOUS) ×3 IMPLANT
SPONGE GAUZE 4X4 12PLY STER LF (GAUZE/BANDAGES/DRESSINGS) ×3 IMPLANT
SPONGE LAP 4X18 X RAY DECT (DISPOSABLE) ×1 IMPLANT
SUT BONE WAX W31G (SUTURE) ×3 IMPLANT
SUT E-PACK MINIMALLY INVASIVE (SUTURE) ×3 IMPLANT
SUT ETHIBOND (SUTURE) ×3 IMPLANT
SUT ETHIBOND 2 0 SH (SUTURE) ×1 IMPLANT
SUT ETHIBOND 2-0 RB-1 WHT (SUTURE) ×3 IMPLANT
SUT ETHIBOND X763 2 0 SH 1 (SUTURE) ×4 IMPLANT
SUT GORETEX CV 4 TH 22 36 (SUTURE) ×5 IMPLANT
SUT GORETEX CV-5THC-13 36IN (SUTURE) ×2 IMPLANT
SUT GORETEX CV4 TH-18 (SUTURE) ×8 IMPLANT
SUT PROLENE 3 0 SH DA (SUTURE) ×1 IMPLANT
SUT PROLENE 3 0 SH1 36 (SUTURE) ×15 IMPLANT
SUT PROLENE 4 0 RB 1 (SUTURE) ×9
SUT PROLENE 4-0 RB1 .5 CRCL 36 (SUTURE) IMPLANT
SUT PROLENE 5 0 C 1 36 (SUTURE) ×1 IMPLANT
SUT PROLENE 6 0 C 1 30 (SUTURE) ×3 IMPLANT
SUT PTFE CHORD X 20MM (SUTURE) ×1 IMPLANT
SUT SILK 2 0 SH CR/8 (SUTURE) IMPLANT
SUT SILK 3 0 SH CR/8 (SUTURE) IMPLANT
SUT VIC AB 2-0 CTX 36 (SUTURE) IMPLANT
SUT VIC AB 2-0 UR6 27 (SUTURE) ×1 IMPLANT
SUT VIC AB 3-0 SH 8-18 (SUTURE) ×2 IMPLANT
SUT VICRYL 2 TP 1 (SUTURE) IMPLANT
SYRINGE 10CC LL (SYRINGE) ×3 IMPLANT
SYSTEM SAHARA CHEST DRAIN ATS (WOUND CARE) ×3 IMPLANT
TAPE CLOTH SURG 4X10 WHT LF (GAUZE/BANDAGES/DRESSINGS) ×1 IMPLANT
TAPE PAPER 2X10 WHT MICROPORE (GAUZE/BANDAGES/DRESSINGS) ×1 IMPLANT
TOWEL OR 17X24 6PK STRL BLUE (TOWEL DISPOSABLE) ×6 IMPLANT
TOWEL OR 17X26 10 PK STRL BLUE (TOWEL DISPOSABLE) ×6 IMPLANT
TRAY FOLEY IC TEMP SENS 16FR (CATHETERS) ×3 IMPLANT
TROCAR XCEL BLADELESS 5X75MML (TROCAR) ×3 IMPLANT
TROCAR XCEL NON-BLD 11X100MML (ENDOMECHANICALS) ×6 IMPLANT
TUBE SUCT INTRACARD DLP 20F (MISCELLANEOUS) ×3 IMPLANT
TUNNELER SHEATH ON-Q 11GX8 DSP (PAIN MANAGEMENT) IMPLANT
UNDERPAD 30X30 INCONTINENT (UNDERPADS AND DIAPERS) ×3 IMPLANT
WATER STERILE IRR 1000ML POUR (IV SOLUTION) ×6 IMPLANT
WIRE .035 3MM-J 145CM (WIRE) ×1 IMPLANT
WIRE J 3MM .035X145CM (WIRE) ×3 IMPLANT

## 2016-03-25 NOTE — Anesthesia Procedure Notes (Signed)
Central Venous Catheter Insertion Performed by: anesthesiologist Patient location: Pre-op. Preanesthetic checklist: patient identified, IV checked, site marked, risks and benefits discussed, surgical consent, monitors and equipment checked, pre-op evaluation, timeout performed and anesthesia consent Position: Trendelenburg Lidocaine 1% used for infiltration Landmarks identified and Seldinger technique used Catheter size: 8.5 Fr Central line and PA cath was placed.Sheath introducer Swan type and PA catheter depth:thermodilation and 55PA Cath depth:55 Procedure performed using ultrasound guided technique. Attempts: 1 Following insertion, line sutured and dressing applied. Post procedure assessment: blood return through all ports, free fluid flow and no air. Patient tolerated the procedure well with no immediate complications.

## 2016-03-25 NOTE — Brief Op Note (Signed)
03/25/2016  2:23 PM  PATIENT:  Rich Brave  70 y.o. male  PRE-OPERATIVE DIAGNOSIS:  MR  POST-OPERATIVE DIAGNOSIS:  MR  PROCEDURE:  TRANSESOPHAGEAL ECHOCARDIOGRAM (TEE),  MINIMALLY INVASIVE RE OPERATION FOR MITRAL VALVE REPAIR  (MVR) (using a Sorin Memo 3D, size 26)   SURGEON:  Surgeon(s) and Role:    * [name], MD - Primary  PHYSICIAN ASSISTANT: Valentina Gu SA  ANESTHESIA:   general  EBL:  Total I/O In: -  Out: 385 [Urine:385]  DRAINS: Chest tubes in the mediastinal and pleural spaces   COUNTS CORRECT:  YES  DICTATION: .Dragon Dictation  PLAN OF CARE: Admit to inpatient   PATIENT DISPOSITION:  ICU - intubated and hemodynamically stable.   Delay start of Pharmacological VTE agent (>24hrs) due to surgical blood loss or risk of bleeding: yes   BASELINE WEIGHT: 98.9 kg

## 2016-03-25 NOTE — Anesthesia Procedure Notes (Signed)
Procedure Name: Intubation Date/Time: 03/25/2016 9:11 AM Performed by: Oletta Lamas Pre-anesthesia Checklist: Patient identified, Emergency Drugs available, Suction available and Patient being monitored Patient Re-evaluated:Patient Re-evaluated prior to inductionOxygen Delivery Method: Circle System Utilized Preoxygenation: Pre-oxygenation with 100% oxygen Intubation Type: IV induction Ventilation: Mask ventilation without difficulty Laryngoscope Size: Mac and 4 Grade View: Grade II Tube type: Oral Endobronchial tube: Left, Double lumen EBT, EBT position confirmed by auscultation and EBT position confirmed by fiberoptic bronchoscope and 39 Fr Number of attempts: 1 Airway Equipment and Method: Stylet and Oral airway Placement Confirmation: ETT inserted through vocal cords under direct vision,  positive ETCO2 and breath sounds checked- equal and bilateral Tube secured with: Tape Dental Injury: Teeth and Oropharynx as per pre-operative assessment and Dental damage  Comments: See note regarding removal of front incisor.  Tooth noted to be loose prior to intubation.

## 2016-03-25 NOTE — Progress Notes (Signed)
Pulmonary mechanics done was good patient effort., pt perform Vital capacity x 3 and the same result each time .82 - .60.  pt FVC .6. And NIF -32. ABG values PCO2 40.5 and Bicarb 20.0 and pH is 7.30. Pt is stable at this time. Pt does have a positive cuff leak and can raise head off the bed for 5+sec. RN aware and notifying MD of findings/weaning results.

## 2016-03-25 NOTE — Anesthesia Procedure Notes (Signed)
Central Venous Catheter Insertion Performed by: anesthesiologist Patient location: Pre-op. Preanesthetic checklist: patient identified, IV checked, site marked, risks and benefits discussed, surgical consent, monitors and equipment checked, pre-op evaluation, timeout performed and anesthesia consent Landmarks identified PA cath was placed.Swan type and PA catheter depth:thermodilationProcedure performed using ultrasound guided technique. Attempts: 1 Patient tolerated the procedure well with no immediate complications.

## 2016-03-25 NOTE — Transfer of Care (Signed)
Immediate Anesthesia Transfer of Care Note  Patient: [name]  Procedure(s) Performed: Procedure(s): MINIMALLY INVASIVE REOPERATION FOR MITRAL VALVE REPAIR  (MVR) with size 18 Sorin Memo 3D (Right) TRANSESOPHAGEAL ECHOCARDIOGRAM (TEE) (N/A)  Patient Location: ICU  Anesthesia Type:General  Level of Consciousness: unresponsive and Patient remains intubated per anesthesia plan  Airway & Oxygen Therapy: Patient remains intubated per anesthesia plan and Patient placed on Ventilator (see vital sign flow sheet for setting)  Post-op Assessment: Report given to RN and Post -op Vital signs reviewed and stable  Post vital signs: Reviewed and stable  Last Vitals: There were no vitals filed for this visit.  Last Pain: There were no vitals filed for this visit.    Patients Stated Pain Goal: 3 ([id number])  Complications: No apparent anesthesia complications

## 2016-03-25 NOTE — Progress Notes (Signed)
Per MD Ricard Dillon wants Recruitment maneuver prior to weaning. recruitment initiated now, RN aware

## 2016-03-25 NOTE — OR Nursing (Signed)
Patient's front tooth cap came off during intubation.  Patient's cap was placed in a collection cup and labeled with a patient label.  Cup was given to [facility] when patient was taken to SICU 11.

## 2016-03-25 NOTE — Brief Op Note (Signed)
03/25/2016  3:32 PM  PATIENT:  Rich Brave  70 y.o. 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

## 2016-03-25 NOTE — Procedures (Signed)
Extubation Procedure Note  Patient Details:   Name: [name] DOB: 1946/03/02 MRN: [id number]   Airway Documentation:  Airway (Active)  Secured at (cm) 23 cm 03/25/2016  8:07 PM  Measured From Lips 03/25/2016  8:07 PM  Secured Location Right 03/25/2016  8:07 PM  Secured By Pink Tape 03/25/2016  8:07 PM  Tube Holder Repositioned Yes 03/25/2016  4:14 PM  Cuff Pressure (cm H2O) 26 cm H2O 03/25/2016  4:14 PM  Site Condition Dry 03/25/2016  8:07 PM    Evaluation  O2 sats: stable throughout Complications: No apparent complications Patient did tolerate procedure well. Bilateral Breath Sounds: Clear, Diminished   Yes  Pt had a postive cuff leak prior to extubation, patient was able to lift his head and maintain it off the pillow for 5-10sec. Procedure was explained to patient, pt nodded his head for understanding. Pt was able to speak and state his name post-extubation. Pt was extubated to a 4L [place] with bubble O2 humidity. Pt tolerated procedure well, no distress or complications noted.   Leigh Aurora, BS, RRT, RCP 03/25/2016, 9:05 PM

## 2016-03-25 NOTE — Progress Notes (Signed)
Pt placed back on a rate at this time.

## 2016-03-25 NOTE — Progress Notes (Signed)
Patient ID: [name], male   DOB: 1946/05/25, 70 y.o.   MRN: [id number] EVENING ROUNDS NOTE :     [address]       [place] [address]             [phone]                 Day of Surgery Procedure(s) (LRB): MINIMALLY INVASIVE REOPERATION FOR MITRAL VALVE REPAIR  (MVR) with size 65 Sorin Memo 3D (Right) TRANSESOPHAGEAL ECHOCARDIOGRAM (TEE) (N/A)  Total Length of Stay:  LOS: 0 days  BP 102/80   Pulse 89   Temp 97 F (36.1 C)   Resp 14   Wt 218 lb (98.9 kg)   SpO2 100%   BMI 32.19 kg/m   .Intake/Output      08/02 0701 - 08/03 0700   I.V. (mL/kg) 392.3 (4)   Blood 1360   IV Piggyback 750   Total Intake(mL/kg) 2502.3 (25.3)   Urine (mL/kg/hr) 715 (0.6)   Blood 1635 (1.4)   Chest Tube 180 (0.1)   Total Output 2530   Net -27.7         . sodium chloride 20 mL/hr at 03/25/16 1800  . sodium chloride 100 mL/hr at 03/25/16 1800  . [START ON 03/26/2016] sodium chloride    . sodium chloride 10 mL/hr at 03/25/16 1800  . dexmedetomidine 0.3 mcg/kg/hr (03/25/16 1845)  . insulin (NOVOLIN-R) infusion 0.9 Units/hr (03/25/16 1856)  . lactated ringers Stopped (03/25/16 1605)  . lactated ringers 20 mL/hr at 03/25/16 1800  . nitroGLYCERIN Stopped (03/25/16 1800)  . phenylephrine (NEO-SYNEPHRINE) Adult infusion Stopped (03/25/16 1605)     Lab Results  Component Value Date   WBC 11.7 (H) 03/25/2016   HGB 13.3 03/25/2016   HCT 37.8 (L) 03/25/2016   PLT 106 (L) 03/25/2016   GLUCOSE 158 (H) 03/25/2016   CHOL 121 (L) 08/08/2015   TRIG 113 08/08/2015   HDL 37 (L) 08/08/2015   LDLCALC 61 08/08/2015   ALT 13 (L) 03/23/2016   AST 18 03/23/2016   NA 138 03/25/2016   K 3.6 03/25/2016   CL 100 (L) 03/25/2016   CREATININE 1.30 (H) 03/25/2016   BUN 13 03/25/2016   CO2 21 (L) 03/23/2016   INR 1.39 03/25/2016   HGBA1C 7.2 (H) 03/23/2016   Still intubated, waking up  Not bleeding  Grace Isaac MD  Beeper [phone] Office [phone] 03/25/2016 7:10  PM

## 2016-03-25 NOTE — Progress Notes (Signed)
Dr. Roxy Manns at bedside notified of K 3.4 on arrival, ABG and chest tube air leak in chamber 1. Verbal order to give 4 runs of K, Rate increased to 14 on vent. With one recruitment. Will continue to closely monitor patient.  Rowe Pavy, RN

## 2016-03-25 NOTE — Interval H&P Note (Signed)
History and Physical Interval Note:  03/25/2016 7:14 AM  [name]  has presented today for surgery, with the diagnosis of MR  The various methods of treatment have been discussed with the patient and family. After consideration of risks, benefits and other options for treatment, the patient has consented to  Procedure(s): MINIMALLY INVASIVE REDO MITRAL VALVE REPAIR OR REPLACEMENT (MVR) (Right) TRANSESOPHAGEAL ECHOCARDIOGRAM (TEE) (N/A) as a surgical intervention .  The patient's history has been reviewed, patient examined, no change in status, stable for surgery.  I have reviewed the patient's chart and labs.  Questions were answered to the patient's satisfaction.     [name]

## 2016-03-25 NOTE — Op Note (Signed)
CARDIOTHORACIC SURGERY OPERATIVE NOTE  Date of Procedure:  03/25/2016  Preoperative Diagnosis: Severe Mitral Regurgitation  Postoperative Diagnosis: Same  Procedure:    Minimally-Invasive Reoperation for Mitral Valve Repair  Complex valvuloplasty including artificial Gore-tex neochord placement x4  Plication of anterior commissure  Sorin Memo 3D Ring Annuloplasty (size 73mm, catalog # [id number], serial # [id number])    Surgeon: Valentina Gu. 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 Cyndia Bent in 1995, hypertension, chronic diastolic congestive heart failure, hyperlipidemia, type 2 diabetes mellitus, and obstructive sleep apnea who has been referred for a second surgical opinion to discuss treatment options for management of severe symptomatic mitral regurgitation. The patient underwent coronary artery bypass grafting in 1995. Although details of the procedure are not currently available, grafts placed at the time of surgery included saphenous vein graft to the obtuse marginal branch and saphenous vein graft to the posterior descending coronary artery. He states that he recovered uneventfully. Since then he has done well from a cardiac standpoint until recently. He was followed for many years by Dr. Maurene Capes, and more recently he has been followed by Dr. Aundra Dubin. Over the past year the patient complains of progressive symptoms of exertional shortness of breath and fatigue.  Symptoms have gotten considerably worse over the past 6-8 weeks, and the patient has began to experience significant fluid retention. He was seen in follow-up by Dr. Aundra Dubin in early May and his dose of Lasix was doubled. He underwent nuclear stress test 12/03/2015 that revealed previous inferior wall infarction with mild peri-infarct ischemia and resting ejection fraction estimated 47%. There were no ST segment changes noted during stress. Transthoracic echocardiogram performed 01/08/2016 revealed normal left ventricular systolic function with at least moderate mitral regurgitation that was eccentric and directed posteriorly. The patient subsequently underwent transesophageal echocardiogram on 01/23/2016 by Dr. Aundra Dubin. Ejection fraction was estimated 55-60%. There was severe mitral regurgitation with an eccentric, posteriorly directed jet. There was no flow reversal in the pulmonary veins. The posterior leaflet appeared somewhat restricted and there may be some associated prolapse of the anterior leaflet. Follow-up catheterization performed 01/23/2016 revealed continued patency of vein graft to the posterior descending coronary artery and the obtuse marginal branch of the left circumflex coronary artery. There is high-grade stenosis in the distal segment of a diagonal branch that was unchanged from previous catheterizations. The left anterior descending coronary artery was relatively small and free of disease with exception of a discrete 90% lesion in the very distal portion of the vessel. Pulmonary artery pressures were mild to moderately elevated. The patient was referred for surgical consultation and previously evaluated by Dr. Cyndia Bent. The patient has been referred for a second surgical opinion to discuss the possibility of minimally invasive approach for mitral valve repair.  The patient has been seen in consultation and counseled at length regarding the indications, risks and potential benefits of surgery.  All  questions have been answered, and the patient provides full informed consent for the operation as described.     DETAILS  OF THE OPERATIVE PROCEDURE  Preparation:  The patient is brought to the operating room on the above mentioned date and central monitoring was established by the anesthesia team including placement of Swan-Ganz catheter through the left internal jugular vein.  A radial arterial line is placed. The patient is placed in the supine position on the operating table.  Intravenous antibiotics are administered. General endotracheal anesthesia is induced uneventfully. The patient is initially intubated using a dual lumen endotracheal tube.  A Foley catheter is placed.  Baseline transesophageal echocardiogram was performed.  Findings were notable for severe mitral regurgitation. The jet of regurgitation was directed posteriorly around the left atrium. There appeared to be prolapse of the commissural leaflet in the anterior commissure. There was normal left ventricular systolic function. There was trace aortic insufficiency. Right ventricular size and function was normal. There was mild tricuspid regurgitation. The tricuspid annulus was not dilated.  A soft roll is placed behind the patient's left scapula and the neck gently extended and turned to the left.   The patient's right neck, chest, abdomen, both groins, and both lower extremities are prepared and draped in a sterile manner. A time out procedure is performed.  Surgical Approach:  A right miniature anterolateral thoracotomy incision is performed. The incision is placed just lateral to and superior to the right nipple. The pectoralis major muscle is retracted medially and completely preserved. The right pleural space is entered through the 3rd intercostal space. A soft tissue retractor is placed.  Two 11 mm ports are placed through separate stab incisions inferiorly. The right pleural space is insufflated continuously with carbon  dioxide gas through the posterior port during the remainder of the operation.  A pledgeted sutures placed through the dome of the right hemidiaphragm and retracted inferiorly to facilitate exposure.     Extracorporeal Cardiopulmonary Bypass and Myocardial Protection:  A small incision is made in the right inguinal crease and the anterior surface of the right common femoral artery and right common femoral vein are identified.  The patient is placed in Trendelenburg position. The right internal jugular vein is cannulated with Seldinger technique and a guidewire advanced into the right atrium. The patient is heparinized systemically. The right internal jugular vein is cannulated with a 14 [place] pediatric femoral venous cannula. Pursestring sutures are placed on the anterior surface of the right common femoral vein and right common femoral artery. The right common femoral vein is cannulated with the Seldinger technique and a guidewire is advanced under transesophageal echocardiogram guidance through the right atrium. The femoral vein is cannulated with a long 22 French femoral venous cannula. The right common femoral artery is cannulated with Seldinger technique and a flexible guidewire is advanced until it can be appreciated intraluminally in the descending thoracic aorta on transesophageal echocardiogram. The femoral artery is cannulated with an 18 French femoral arterial cannula.  Adequate heparinization is verified.     The entire pre-bypass portion of the operation was notable for stable hemodynamics.  Cardiopulmonary bypass was begun.  Vacuum assist venous drainage is utilized. Venous drainage and exposure are notably excellent.   A combination of sharp dissection and electrocautery is utilized to dissect the pericardial fat away from the surface of the pericardium. The pericardium is incised longitudinally approximately 3 cm anterior to the phrenic nerve. There are adhesions between the right atrium and  the pericardium from previous surgery. A combination of sharp dissection and electrocautery is utilized to divide the adhesions. The previous vein graft to the posterior  descending coronary artery is identified and care is taken to stay away from the vein graft. Dissection is continued inferiorly until the inferior surface of the right ventricle is exposed. Dissection is continued posteriorly to expose the entire intra-atrial groove.  A bipolar epicardial pacing wire is placed into the right ventricular surface. The patient is placed in Trendelenburg's position. The patient is cooled to 28 systemic temperature. Ventricular fibrillation is induced using rapid ventricular pacing. The entire mitral valve repair is performed under cold fibrillatory arrest.   Mitral Valve Repair:  A left atriotomy incision was performed through the interatrial groove and extended partially across the back wall of the left atrium after opening the oblique sinus inferiorly.  The mitral valve is exposed using a self-retaining retractor.  The mitral valve was inspected and notable for fibroelastic deficiency type degenerative disease. There is a single ruptured primary chordae tendineae to the A1 segment of the anterior leaflet. There is prolapse involving the commissural leaflet of the anterior commissure. The remainder of the mitral valve appears essentially normal. There is no significant restriction of the posterior leaflet. The annulus is dilated in the overall size of the anterior leaflet is relatively small.   Interrupted 2-0 Ethibond horizontal mattress sutures are placed circumferentially around the entire mitral valve annulus. The sutures will ultimately be utilized for ring annuloplasty, and at this juncture there are utilized to suspend the valve symmetrically.  Artificial neochord placement was performed using Chord-X multi-strand CV-4 Goretex pre-measured loops.  The appropriate cord length was measured from  corresponding normal length primary cords from the A2 segment of the anterior leaflet. The papillary muscle suture of the Chord-X multi-strand suture was placed through the head of the anterior papillary muscle in a horizontal mattress fashion and tied over Teflon felt pledgets. Two of the three pre-measured loops were then reimplanted into the free margin of the commissural leaflet.  The remaining premeasured loop is placed through the A1 segment of the anterior leaflet.  The valve was tested with saline and appeared competent even without ring annuloplasty complete. The valve was sized to a 26 mm annuloplasty ring, based upon the transverse distance between the left and right commissures and the height of the anterior leaflet, corresponding to a size just slightly larger than the overall surface area of the anterior leaflet.  A Sorin Memo 3D annuloplasty ring (size 61mm, catalog [id number], serial [id number]) was secured in place uneventfully. All ring sutures were secured using a Cor-knot device.  The valve was tested with saline and appeared competent. However, the A1 segment appeared restricted. Subsequently the Gore-Tex loop to the A1 segment was removed. To correct the prolapse of A1 the anterior commissure was plicated using a pair of interrupted everting CV 5 Gore-Tex sutures.  The valve is again tested with saline and appears to be perfectly competent with a broad symmetrical line of coaptation of the anterior and posterior leaflet. There is no residual leak. There was a broad, symmetrical line of coaptation of the anterior and posterior leaflet.  Rewarming is begun.   Procedure Completion:  The atriotomy was closed using a 2-layer closure of running 3-0 Prolene suture after placing a sump drain across the mitral valve to serve as a left ventricular vent.  Epicardial pacing wires are fixed to the right atrial appendage. The patient is rewarmed to 37C temperature. The left ventricular vent is removed.   The patient is ventilated and flow volumes turndown while the mitral valve repair is inspected using transesophageal echocardiogram.  The valve repair appears intact with no residual leak.  The patient is weaned and disconnected from cardiopulmonary bypass.  The patient's rhythm at separation from bypass was AV paced.  The patient was weaned from bypass without any inotropic support. Total cardiopulmonary bypass time for the operation was 180 minutes.  Followup transesophageal echocardiogram performed after separation from bypass revealed a well-seated annuloplasty ring in the mitral position with a normal functioning mitral valve. There was no residual leak.  Left ventricular function was unchanged from preoperatively.  The mean gradient across the mitral valve was estimated to be 3 mmHg.  The femoral arterial and venous cannulae were removed uneventfully. There was a palpable pulse in the distal right common femoral artery after removal of the cannula. Protamine was administered to reverse the anticoagulation. The right internal jugular cannula was removed and manual pressure held on the neck for 15 minutes.  Single lung ventilation was begun. The atriotomy closure was inspected for hemostasis. The pericardial sac was drained using a 28 French Bard drain placed through the anterior port incision.  The pericardium was closed using a patch of core matrix bovine submucosal tissue patch. The right pleural space is irrigated with saline solution and inspected for hemostasis. The right pleural space was drained using a 28 French Bard drain placed through the posterior port incision. The miniature thoracotomy incision was closed in multiple layers in routine fashion. The right groin incision was inspected for hemostasis and closed in multiple layers in routine fashion.  The post-bypass portion of the operation was notable for stable rhythm and hemodynamics.  No blood products were administered during the  operation.   Disposition:  The patient tolerated the procedure well.  The patient was reintubated using a single lumen endotracheal tube and subsequently transported to the surgical intensive care unit in stable condition. There were no intraoperative complications. All sponge instrument and needle counts are verified correct at completion of the operation.     Valentina Gu. Roxy Manns MD 03/25/2016 3:34 PM

## 2016-03-25 NOTE — Progress Notes (Signed)
Weaning initiated. Pt is on SIMV 4 40% at this time. RT at bedside.

## 2016-03-25 NOTE — Progress Notes (Signed)
MD aware and wants to proceed with extubation after Bicarb push.

## 2016-03-25 NOTE — Progress Notes (Signed)
  Echocardiogram Echocardiogram Transesophageal has been performed.  [name] M 03/25/2016, 4:07 PM

## 2016-03-25 NOTE — Progress Notes (Signed)
CPAP/PS weaning at this time. RN aware and at bedside. RT bedside.  Pt tolerating it well

## 2016-03-26 ENCOUNTER — Inpatient Hospital Stay (HOSPITAL_COMMUNITY): Payer: Medicare Other

## 2016-03-26 ENCOUNTER — Encounter (HOSPITAL_COMMUNITY): Payer: Self-pay | Admitting: Thoracic Surgery (Cardiothoracic Vascular Surgery)

## 2016-03-26 LAB — GLUCOSE, CAPILLARY
GLUCOSE-CAPILLARY: 126 mg/dL — AB (ref 65–99)
GLUCOSE-CAPILLARY: 101 mg/dL — AB (ref 65–99)
GLUCOSE-CAPILLARY: 102 mg/dL — AB (ref 65–99)
GLUCOSE-CAPILLARY: 114 mg/dL — AB (ref 65–99)
GLUCOSE-CAPILLARY: 119 mg/dL — AB (ref 65–99)
Glucose-Capillary: 108 mg/dL — ABNORMAL HIGH (ref 65–99)
Glucose-Capillary: 111 mg/dL — ABNORMAL HIGH (ref 65–99)
Glucose-Capillary: 112 mg/dL — ABNORMAL HIGH (ref 65–99)
Glucose-Capillary: 129 mg/dL — ABNORMAL HIGH (ref 65–99)

## 2016-03-26 LAB — POCT I-STAT, CHEM 8
BUN: 12 mg/dL (ref 6–20)
CALCIUM ION: 1.12 mmol/L (ref 1.12–1.23)
CHLORIDE: 99 mmol/L — AB (ref 101–111)
Creatinine, Ser: 1.5 mg/dL — ABNORMAL HIGH (ref 0.61–1.24)
Glucose, Bld: 139 mg/dL — ABNORMAL HIGH (ref 65–99)
HCT: 30 % — ABNORMAL LOW (ref 39.0–52.0)
Hemoglobin: 10.2 g/dL — ABNORMAL LOW (ref 13.0–17.0)
Potassium: 3.8 mmol/L (ref 3.5–5.1)
SODIUM: 136 mmol/L (ref 135–145)
TCO2: 22 mmol/L (ref 0–100)

## 2016-03-26 LAB — CBC
HCT: 20.4 % — ABNORMAL LOW (ref 39.0–52.0)
HCT: 32.6 % — ABNORMAL LOW (ref 39.0–52.0)
HEMATOCRIT: 35.5 % — AB (ref 39.0–52.0)
Hemoglobin: 11.8 g/dL — ABNORMAL LOW (ref 13.0–17.0)
Hemoglobin: 6.7 g/dL — CL (ref 13.0–17.0)
Hemoglobin: 10.6 g/dL — ABNORMAL LOW (ref 13.0–17.0)
MCH: 30.4 pg (ref 26.0–34.0)
MCH: 30.3 pg (ref 26.0–34.0)
MCH: 30.2 pg (ref 26.0–34.0)
MCHC: 33.2 g/dL (ref 30.0–36.0)
MCHC: 32.8 g/dL (ref 30.0–36.0)
MCHC: 32.5 g/dL (ref 30.0–36.0)
MCV: 91.5 fL (ref 78.0–100.0)
MCV: 92.3 fL (ref 78.0–100.0)
MCV: 92.9 fL (ref 78.0–100.0)
PLATELETS: 123 10*3/uL — AB (ref 150–400)
PLATELETS: 68 10*3/uL — AB (ref 150–400)
PLATELETS: 99 10*3/uL — AB (ref 150–400)
RBC: 3.88 MIL/uL — ABNORMAL LOW (ref 4.22–5.81)
RBC: 2.21 MIL/uL — ABNORMAL LOW (ref 4.22–5.81)
RBC: 3.51 MIL/uL — AB (ref 4.22–5.81)
RDW: 13.4 % (ref 11.5–15.5)
RDW: 13.6 % (ref 11.5–15.5)
RDW: 13.6 % (ref 11.5–15.5)
WBC: 12.4 10*3/uL — ABNORMAL HIGH (ref 4.0–10.5)
WBC: 6.3 10*3/uL (ref 4.0–10.5)
WBC: 9.4 10*3/uL (ref 4.0–10.5)

## 2016-03-26 LAB — MAGNESIUM
MAGNESIUM: 2.3 mg/dL (ref 1.7–2.4)
MAGNESIUM: 2.2 mg/dL (ref 1.7–2.4)
Magnesium: 2.3 mg/dL (ref 1.7–2.4)
Magnesium: 1.2 mg/dL — ABNORMAL LOW (ref 1.7–2.4)

## 2016-03-26 LAB — CREATININE, SERUM
CREATININE: 1.48 mg/dL — AB (ref 0.61–1.24)
CREATININE: 0.84 mg/dL (ref 0.61–1.24)
CREATININE: 1.62 mg/dL — AB (ref 0.61–1.24)
GFR calc Af Amer: >60 mL/min (ref >60)
GFR calc Af Amer: 48 mL/min — ABNORMAL LOW (ref >60)
GFR calc non Af Amer: >60 mL/min (ref >60)
GFR, EST AFRICAN AMERICAN: 54 mL/min — AB (ref >60)
GFR, EST NON AFRICAN AMERICAN: 46 mL/min — AB (ref >60)
GFR, EST NON AFRICAN AMERICAN: 41 mL/min — AB (ref >60)

## 2016-03-26 LAB — BASIC METABOLIC PANEL
ANION GAP: 7 (ref 5–15)
BUN: 12 mg/dL (ref 6–20)
CHLORIDE: 110 mmol/L (ref 101–111)
CO2: 22 mmol/L (ref 22–32)
Calcium: 7.7 mg/dL — ABNORMAL LOW (ref 8.9–10.3)
Creatinine, Ser: 1.34 mg/dL — ABNORMAL HIGH (ref 0.61–1.24)
GFR calc Af Amer: >60 mL/min (ref >60)
GFR, EST NON AFRICAN AMERICAN: 52 mL/min — AB (ref >60)
GLUCOSE: 106 mg/dL — AB (ref 65–99)
POTASSIUM: 4 mmol/L (ref 3.5–5.1)
Sodium: 139 mmol/L (ref 135–145)

## 2016-03-26 MED ORDER — WARFARIN SODIUM 2.5 MG PO TABS
2.5000 mg | ORAL_TABLET | Freq: Every day | ORAL | Status: DC
  Administered 2016-03-26 – 2016-03-28 (×3): 2.5 mg via ORAL
  Filled 2016-03-26 (×3): qty 1

## 2016-03-26 MED ORDER — AMIODARONE HCL 200 MG PO TABS
200.0000 mg | ORAL_TABLET | Freq: Two times a day (BID) | ORAL | Status: DC
  Administered 2016-03-27 – 2016-03-28 (×3): 200 mg via ORAL
  Filled 2016-03-26 (×3): qty 1

## 2016-03-26 MED ORDER — INSULIN DETEMIR 100 UNIT/ML ~~LOC~~ SOLN
20.0000 [IU] | Freq: Two times a day (BID) | SUBCUTANEOUS | Status: DC
  Administered 2016-03-26 – 2016-03-29 (×7): 20 [IU] via SUBCUTANEOUS
  Filled 2016-03-26 (×8): qty 0.2

## 2016-03-26 MED ORDER — ROSUVASTATIN CALCIUM 10 MG PO TABS
10.0000 mg | ORAL_TABLET | Freq: Every day | ORAL | Status: DC
  Administered 2016-03-26 – 2016-03-31 (×6): 10 mg via ORAL
  Filled 2016-03-26 (×6): qty 1

## 2016-03-26 MED ORDER — WARFARIN - PHYSICIAN DOSING INPATIENT
Freq: Every day | Status: DC
  Administered 2016-03-26: 1
  Administered 2016-03-27: 18:00:00

## 2016-03-26 MED ORDER — MORPHINE SULFATE (PF) 2 MG/ML IV SOLN
2.0000 mg | INTRAVENOUS | Status: DC | PRN
  Administered 2016-03-29: 2 mg via INTRAVENOUS
  Filled 2016-03-26: qty 1

## 2016-03-26 MED ORDER — TRAZODONE HCL 100 MG PO TABS: 100.0000 mg | ORAL_TABLET | Freq: Every evening | ORAL | Status: DC | PRN

## 2016-03-26 MED ORDER — FUROSEMIDE 10 MG/ML IJ SOLN
20.0000 mg | Freq: Four times a day (QID) | INTRAMUSCULAR | Status: AC
  Administered 2016-03-26 – 2016-03-27 (×3): 20 mg via INTRAVENOUS
  Filled 2016-03-26 (×3): qty 2

## 2016-03-26 MED ORDER — INSULIN ASPART 100 UNIT/ML ~~LOC~~ SOLN
0.0000 [IU] | SUBCUTANEOUS | Status: DC
  Administered 2016-03-26 – 2016-03-27 (×3): 2 [IU] via SUBCUTANEOUS

## 2016-03-26 MED ORDER — LACTATED RINGERS IV SOLN: INTRAVENOUS | Status: DC

## 2016-03-26 MED ORDER — ASPIRIN EC 81 MG PO TBEC
81.0000 mg | DELAYED_RELEASE_TABLET | Freq: Every day | ORAL | Status: DC
  Administered 2016-03-27 – 2016-04-01 (×6): 81 mg via ORAL
  Filled 2016-03-26 (×6): qty 1

## 2016-03-26 MED ORDER — CARVEDILOL 6.25 MG PO TABS
6.2500 mg | ORAL_TABLET | Freq: Two times a day (BID) | ORAL | Status: DC
  Administered 2016-03-26 – 2016-03-27 (×4): 6.25 mg via ORAL
  Filled 2016-03-26 (×4): qty 1

## 2016-03-26 MED FILL — Electrolyte-R (PH 7.4) Solution: INTRAVENOUS | Qty: 3000 | Status: AC

## 2016-03-26 MED FILL — Mannitol IV Soln 20%: INTRAVENOUS | Qty: 500 | Status: AC

## 2016-03-26 MED FILL — Sodium Bicarbonate IV Soln 8.4%: INTRAVENOUS | Qty: 50 | Status: AC

## 2016-03-26 MED FILL — Sodium Chloride IV Soln 0.9%: INTRAVENOUS | Qty: 3000 | Status: AC

## 2016-03-26 MED FILL — Heparin Sodium (Porcine) Inj 1000 Unit/ML: INTRAMUSCULAR | Qty: 60 | Status: AC

## 2016-03-26 NOTE — Progress Notes (Signed)
Pt is on NIV at this time tolerating it well.  

## 2016-03-26 NOTE — Anesthesia Postprocedure Evaluation (Signed)
Anesthesia Post Note  Patient: [name]  Procedure(s) Performed: Procedure(s) (LRB): MINIMALLY INVASIVE REOPERATION FOR MITRAL VALVE REPAIR  (MVR) with size 73 Sorin Memo 3D (Right) TRANSESOPHAGEAL ECHOCARDIOGRAM (TEE) (N/A)  Patient location during evaluation: ICU Anesthesia Type: General Level of consciousness: awake Pain management: pain level controlled Vital Signs Assessment: post-procedure vital signs reviewed and stable Respiratory status: spontaneous breathing Cardiovascular status: stable Anesthetic complications: no Comments: Patient sitting in chair at bedside, alert and oriented.  States that he is tired but having no pain. Explained to patient that his left upper incisor was very loose when we opened his mouth and it was removed to prevent it from being lost while instrumenting his airway.  Patient stated that he had no problem and that particular tooth has come loose many times over the last month. Also, this morning a back tooth had fallen out during breakfast.    Last Vitals:  Vitals:   03/26/16 1500 03/26/16 1608  BP:    Pulse: 64   Resp: (!) 22   Temp: 36.2 C 36.4 C    Last Pain:  Vitals:   03/26/16 1608  TempSrc: Oral  PainSc:                  Zamir Staples R

## 2016-03-26 NOTE — Progress Notes (Signed)
Pt has his home unit CPAP. RT set up for patient. Pt is stable at this time no distress or complications noted. Pt states he has his settings on auto titrate 5-20cmh2o pressure.

## 2016-03-26 NOTE — Progress Notes (Signed)
[address]       [place] [address]             [phone]        CARDIOTHORACIC SURGERY PROGRESS NOTE   R1 Day Post-Op Procedure(s) (LRB): MINIMALLY INVASIVE REOPERATION FOR MITRAL VALVE REPAIR  (MVR) with size 26 Sorin Memo 3D (Right) TRANSESOPHAGEAL ECHOCARDIOGRAM (TEE) (N/A)  Subjective: Looks really good and feels well.  Minimal pain, only with cough.  No SOB.  Slept well.  Objective: Vital signs: BP Readings from Last 1 Encounters:  03/26/16 99/68   Pulse Readings from Last 1 Encounters:  03/26/16 75   Resp Readings from Last 1 Encounters:  03/26/16 20   Temp Readings from Last 1 Encounters:  03/26/16 99.7 F (37.6 C)    Hemodynamics: PAP: (24-48)/(17-28) 42/19 CO:  [2.8 L/min-6.6 L/min] 5.3 L/min CI:  [1.3 L/min/m2-3.1 L/min/m2] 2.5 L/min/m2  Physical Exam:  Rhythm:   sinus  Breath sounds: clear  Heart sounds:  RRR w/out murmur  Incisions:  Dressings dry, intact  Abdomen:  Soft, non-distended, non-tender  Extremities:  Warm, well-perfused  Chest tubes:  Low volume thin serosanguinous output, + intermittent air leak ? connections    Intake/Output from previous day: 08/02 0701 - 08/03 0700 In: 4220.6 [I.V.:1780.6; Blood:1360; NG/GT:30; IV [id number] Out: [id number] [Urine:1300; Emesis/NG output:100; Blood:1635; Chest Tube:610] Intake/Output this shift: No intake/output data recorded.  Lab Results:  CBC: Recent Labs  03/25/16 2200 03/25/16 2321 03/26/16 0411  WBC 12.7*  --  12.4*  HGB 12.3* 11.6* 11.8*  HCT 36.4* 34.0* 35.5*  PLT 119*  --  123*    BMET:  Recent Labs  03/23/16 1131  03/25/16 2321 03/26/16 0411  NA 138  < > 141 139  K 4.2  < > 4.1 4.0  CL 109  < > 107 110  CO2 21*  --   --  22  GLUCOSE 141*  < > 111* 106*  BUN 12  < > 13 12  CREATININE 1.42*  < > 1.40* 1.34*  CALCIUM 9.3  --   --  7.7*  < > = values in this interval not displayed.   PT/INR:   Recent Labs  03/25/16 1615  LABPROT 17.1*    INR 1.39    CBG (last 3)   Recent Labs  03/25/16 1855 03/25/16 2030 03/25/16 2146  GLUCAP 83 122* 126*    ABG    Component Value Date/Time   PHART 7.317 (L) 03/25/2016 2317   PCO2ART 41.5 03/25/2016 2317   PO2ART 83.0 03/25/2016 2317   HCO3 21.1 03/25/2016 2317   TCO2 23 03/25/2016 2321   ACIDBASEDEF 5.0 (H) 03/25/2016 2317   O2SAT 95.0 03/25/2016 2317    CXR: PORTABLE CHEST 1 VIEW  COMPARISON:  March 25, 2016  FINDINGS: Endotracheal tube and nasogastric tube have been removed. Swan-Ganz catheter tip is in the main pulmonary outflow tract. There is a chest tube on the right. Temporary pacemaker wires are attached to the right heart. No pneumothorax is evident. There is patchy airspace opacity in the left base. Lungs elsewhere clear. Heart is mildly enlarged with pulmonary vascularity within normal limits. No adenopathy. There is atherosclerotic calcification in the aortic arch.  IMPRESSION: Tube and catheter positions as described without pneumothorax. Patchy airspace opacity in the left base consistent with either atelectasis or early pneumonia. Both entities may be present concurrently. Lungs elsewhere clear. Stable cardiac silhouette. Aortic atherosclerosis.   Electronically Signed   By:  Lowella Grip III M.D.   On: 03/26/2016 08:14  Assessment/Plan: S/P Procedure(s) (LRB): MINIMALLY INVASIVE REOPERATION FOR MITRAL VALVE REPAIR  (MVR) with size 72 Sorin Memo 3D (Right) TRANSESOPHAGEAL ECHOCARDIOGRAM (TEE) (N/A)  Doing very well POD1 Maintaining NSR w/ stable hemodynamics and low PA pressures, no drips Breathing comfortably w/ O2 sats 98% on 4 L/min Expected post op acute blood loss anemia, very mild Chronic diastolic CHF with expected post-op volume excess Type II diabetes mellitus, excellent glycemic control on insulin drip CAD s/p CABG in remote past Hypertension OSA on CPAP at home Obesity   Mobilize  D/C  lines  Diuresis  Levemir insulin and wean drip  Start coumadin slowly   [name], MD 03/26/2016 8:00 AM

## 2016-03-26 NOTE — Progress Notes (Signed)
Patient ID: [name], male   DOB: 01/03/1946, 70 y.o.   MRN: [id number]   SICU Evening Rounds:   Hemodynamically stable in sinus rhythm  Up in a chair most of the day and ambulated   Urine output good  CT output low  CBC    Component Value Date/Time   WBC 12.4 (H) 03/26/2016 0411   RBC 3.88 (L) 03/26/2016 0411   HGB 10.2 (L) 03/26/2016 1728   HCT 30.0 (L) 03/26/2016 1728   PLT 123 (L) 03/26/2016 0411   MCV 91.5 03/26/2016 0411   MCH 30.4 03/26/2016 0411   MCHC 33.2 03/26/2016 0411   RDW 13.4 03/26/2016 0411   LYMPHSABS 1,815 01/16/2016 1513   MONOABS 726 01/16/2016 1513   EOSABS 605 (H) 01/16/2016 1513   BASOSABS 121 01/16/2016 1513     BMET    Component Value Date/Time   NA 136 03/26/2016 1728   K 3.8 03/26/2016 1728   CL 99 (L) 03/26/2016 1728   CO2 22 03/26/2016 0411   GLUCOSE 139 (H) 03/26/2016 1728   BUN 12 03/26/2016 1728   CREATININE 1.50 (H) 03/26/2016 1728   CREATININE 1.49 (H) 01/16/2016 1513   CALCIUM 7.7 (L) 03/26/2016 0411   GFRNONAA 52 (L) 03/26/2016 0411   GFRAA >60 03/26/2016 0411     A/P:  Stable postop course. Continue current plans

## 2016-03-26 NOTE — Progress Notes (Signed)
CPAP machines not available, all equipment in patient use. If/when equipment becomes available RT will come set up NIV for patient.

## 2016-03-26 NOTE — Plan of Care (Signed)
Problem: Cardiac: Goal: Will show no signs and symptoms of excessive bleeding Outcome: Progressing Minimal ctd

## 2016-03-27 ENCOUNTER — Inpatient Hospital Stay (HOSPITAL_COMMUNITY): Payer: Medicare Other

## 2016-03-27 LAB — CBC
HCT: 29.8 % — ABNORMAL LOW (ref 39.0–52.0)
Hemoglobin: 10 g/dL — ABNORMAL LOW (ref 13.0–17.0)
MCH: 31.1 pg (ref 26.0–34.0)
MCHC: 33.6 g/dL (ref 30.0–36.0)
MCV: 92.5 fL (ref 78.0–100.0)
PLATELETS: 101 10*3/uL — AB (ref 150–400)
RBC: 3.22 MIL/uL — AB (ref 4.22–5.81)
RDW: 13.4 % (ref 11.5–15.5)
WBC: 8.5 10*3/uL (ref 4.0–10.5)

## 2016-03-27 LAB — GLUCOSE, CAPILLARY
GLUCOSE-CAPILLARY: 114 mg/dL — AB (ref 65–99)
GLUCOSE-CAPILLARY: 117 mg/dL — AB (ref 65–99)
GLUCOSE-CAPILLARY: 103 mg/dL — AB (ref 65–99)
Glucose-Capillary: 109 mg/dL — ABNORMAL HIGH (ref 65–99)
Glucose-Capillary: 116 mg/dL — ABNORMAL HIGH (ref 65–99)
Glucose-Capillary: 120 mg/dL — ABNORMAL HIGH (ref 65–99)
Glucose-Capillary: 121 mg/dL — ABNORMAL HIGH (ref 65–99)
Glucose-Capillary: 127 mg/dL — ABNORMAL HIGH (ref 65–99)
Glucose-Capillary: 131 mg/dL — ABNORMAL HIGH (ref 65–99)
Glucose-Capillary: 142 mg/dL — ABNORMAL HIGH (ref 65–99)
Glucose-Capillary: 75 mg/dL (ref 65–99)
Glucose-Capillary: 87 mg/dL (ref 65–99)

## 2016-03-27 LAB — BASIC METABOLIC PANEL
Anion gap: 6 (ref 5–15)
BUN: 11 mg/dL (ref 6–20)
CALCIUM: 7.8 mg/dL — AB (ref 8.9–10.3)
CHLORIDE: 104 mmol/L (ref 101–111)
CO2: 26 mmol/L (ref 22–32)
CREATININE: 1.42 mg/dL — AB (ref 0.61–1.24)
GFR calc non Af Amer: 49 mL/min — ABNORMAL LOW (ref >60)
GFR, EST AFRICAN AMERICAN: 56 mL/min — AB (ref >60)
Glucose, Bld: 105 mg/dL — ABNORMAL HIGH (ref 65–99)
Potassium: 3.5 mmol/L (ref 3.5–5.1)
SODIUM: 136 mmol/L (ref 135–145)

## 2016-03-27 LAB — PROTIME-INR
INR: 1.34
PROTHROMBIN TIME: 16.7 s — AB (ref 11.4–15.2)

## 2016-03-27 MED ORDER — FUROSEMIDE 40 MG PO TABS
40.0000 mg | ORAL_TABLET | Freq: Two times a day (BID) | ORAL | Status: DC
  Administered 2016-03-28 – 2016-04-01 (×9): 40 mg via ORAL
  Filled 2016-03-27 (×9): qty 1

## 2016-03-27 MED ORDER — SODIUM CHLORIDE 0.9 % IV BOLUS (SEPSIS): 500.0000 mL | Freq: Once | INTRAVENOUS | Status: DC

## 2016-03-27 MED ORDER — SODIUM CHLORIDE 0.9% FLUSH
3.0000 mL | Freq: Two times a day (BID) | INTRAVENOUS | Status: DC
  Administered 2016-03-27 – 2016-03-31 (×9): 3 mL via INTRAVENOUS

## 2016-03-27 MED ORDER — POTASSIUM CHLORIDE 10 MEQ/50ML IV SOLN
10.0000 meq | INTRAVENOUS | Status: AC
  Administered 2016-03-27 (×2): 10 meq via INTRAVENOUS

## 2016-03-27 MED ORDER — POTASSIUM CHLORIDE 10 MEQ/50ML IV SOLN
10.0000 meq | INTRAVENOUS | Status: AC | PRN
  Administered 2016-03-27 (×3): 10 meq via INTRAVENOUS
  Filled 2016-03-27: qty 50

## 2016-03-27 MED ORDER — POTASSIUM CHLORIDE CRYS ER 20 MEQ PO TBCR
20.0000 meq | EXTENDED_RELEASE_TABLET | Freq: Two times a day (BID) | ORAL | Status: DC
  Administered 2016-03-28 – 2016-03-29 (×4): 20 meq via ORAL
  Filled 2016-03-27 (×4): qty 1

## 2016-03-27 MED ORDER — FUROSEMIDE 10 MG/ML IJ SOLN
40.0000 mg | Freq: Once | INTRAMUSCULAR | Status: AC
  Administered 2016-03-27: 40 mg via INTRAVENOUS

## 2016-03-27 MED ORDER — FUROSEMIDE 10 MG/ML IJ SOLN
40.0000 mg | Freq: Once | INTRAMUSCULAR | Status: AC
  Filled 2016-03-27: qty 4

## 2016-03-27 MED ORDER — FUROSEMIDE 40 MG PO TABS: 40.0000 mg | ORAL_TABLET | Freq: Two times a day (BID) | ORAL | Status: DC

## 2016-03-27 MED ORDER — MOVING RIGHT ALONG BOOK
Freq: Once | Status: AC
  Administered 2016-03-27: 08:00:00
  Filled 2016-03-27: qty 1

## 2016-03-27 MED ORDER — INSULIN ASPART 100 UNIT/ML ~~LOC~~ SOLN
0.0000 [IU] | Freq: Three times a day (TID) | SUBCUTANEOUS | Status: DC
  Administered 2016-03-27 – 2016-03-28 (×4): 2 [IU] via SUBCUTANEOUS
  Administered 2016-03-28: 4 [IU] via SUBCUTANEOUS
  Administered 2016-03-28 – 2016-03-29 (×2): 2 [IU] via SUBCUTANEOUS
  Administered 2016-03-29: 4 [IU] via SUBCUTANEOUS
  Administered 2016-03-29: 2 [IU] via SUBCUTANEOUS
  Administered 2016-03-30: 4 [IU] via SUBCUTANEOUS
  Administered 2016-03-30 (×2): 2 [IU] via SUBCUTANEOUS
  Administered 2016-03-30: 4 [IU] via SUBCUTANEOUS
  Administered 2016-03-31: 2 [IU] via SUBCUTANEOUS
  Administered 2016-03-31: 4 [IU] via SUBCUTANEOUS
  Administered 2016-03-31 – 2016-04-01 (×3): 2 [IU] via SUBCUTANEOUS

## 2016-03-27 MED ORDER — SODIUM CHLORIDE 0.9 % IV SOLN: 250.0000 mL | INTRAVENOUS | Status: DC | PRN

## 2016-03-27 MED ORDER — SODIUM CHLORIDE 0.9% FLUSH: 3.0000 mL | INTRAVENOUS | Status: DC | PRN

## 2016-03-27 MED ORDER — POTASSIUM CHLORIDE 10 MEQ/50ML IV SOLN
INTRAVENOUS | Status: AC
  Administered 2016-03-27: 10 meq via INTRAVENOUS
  Filled 2016-03-27: qty 150

## 2016-03-27 NOTE — Discharge Summary (Signed)
Physician Discharge Summary  Patient ID: [name] MRN: [id number] DOB/AGE: [birth year] 70 y.o.  Admit date: 03/25/2016 Discharge date: 04/01/2016  Admission Diagnoses:  Patient Active Problem List   Diagnosis Date Noted  . Atherosclerotic heart disease of native coronary artery without angina pectoris 03/10/2016  . Heart failure ([place]) 03/10/2016  . Cataract extraction status of eye 03/10/2016  . Other long term (current) drug therapy 03/10/2016  . Osteoarthritis of knee 03/10/2016  . Type 2 diabetes mellitus without complications ([place]) [id number]  . Vitamin D deficiency 03/10/2016  . Mitral regurgitation   . Avitaminosis D 12/25/2015  . Controlled type 2 diabetes mellitus without complication ([place]) [id number]  . 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 Arthritis of knee, degenerative 12/25/2015  . H/O cataract extraction 12/25/2015  . Arteriosclerosis of coronary artery 12/25/2015  . Congestive heart failure ([place]) 12/25/2015  . Pure hypercholesterolemia 12/25/2015  . Allergic rhinitis 12/25/2015  . Essential (primary) hypertension 12/25/2015  . Hypotension 01/29/2014  . Expected Blood loss, postoperative 04/29/2012  . Pain due to unicompartmental arthroplasty of knee ([place]) 04/27/2012  . Preoperative evaluation of a medical condition to rule out surgical contraindications (TAR required) 03/31/2012  . Diastolic CHF, chronic ([place]) 06/29/2011  . Preoperative evaluation to rule out surgical contraindication 06/29/2011  . CAROTID ARTERY STENOSIS 07/09/2010  . TIA 05/29/2010  . FATIGUE / MALAISE 05/29/2010  . CAD, AUTOLOGOUS BYPASS GRAFT 11/15/2008  . MYOCARDIAL INFARCTION 12/18/2007  . ALLERGIC RHINITIS WITH CONJUNCTIVITIS 12/18/2007  . DM 12/05/2007  . HYPERLIPIDEMIA 12/05/2007  . OBESITY 12/05/2007  . Essential hypertension 12/05/2007  . Coronary atherosclerosis 12/05/2007  . DEGENERATIVE JOINT DISEASE 12/05/2007  . SLEEP APNEA 12/05/2007   Discharge Diagnoses:   Patient  Active Problem List   Diagnosis Date Noted  . Pneumothorax on right 03/29/2016  . 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/25/2016  . Atherosclerotic heart disease of native coronary artery without angina pectoris 03/10/2016  . Heart failure ([place]) 03/10/2016  . Cataract extraction status of eye 03/10/2016  . Other long term (current) drug therapy 03/10/2016  . Osteoarthritis of knee 03/10/2016  . Type 2 diabetes mellitus without complications ([place]) [id number]  . Vitamin D deficiency 03/10/2016  . Mitral regurgitation   . Avitaminosis D 12/25/2015  . Controlled type 2 diabetes mellitus without complication ([place]) [id number]  . 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 Arthritis of knee, degenerative 12/25/2015  . H/O cataract extraction 12/25/2015  . Arteriosclerosis of coronary artery 12/25/2015  . Congestive heart failure ([place]) 12/25/2015  . Pure hypercholesterolemia 12/25/2015  . Allergic rhinitis 12/25/2015  . Essential (primary) hypertension 12/25/2015  . Hypotension 01/29/2014  . Expected Blood loss, postoperative 04/29/2012  . Pain due to unicompartmental arthroplasty of knee ([place]) 04/27/2012  . Preoperative evaluation of a medical condition to rule out surgical contraindications (TAR required) 03/31/2012  . Diastolic CHF, chronic ([place]) 06/29/2011  . Preoperative evaluation to rule out surgical contraindication 06/29/2011  . CAROTID ARTERY STENOSIS 07/09/2010  . TIA 05/29/2010  . FATIGUE / MALAISE 05/29/2010  . CAD, AUTOLOGOUS BYPASS GRAFT 11/15/2008  . MYOCARDIAL INFARCTION 12/18/2007  . ALLERGIC RHINITIS WITH CONJUNCTIVITIS 12/18/2007  . DM 12/05/2007  . HYPERLIPIDEMIA 12/05/2007  . OBESITY 12/05/2007  . Essential hypertension 12/05/2007  . Coronary atherosclerosis 12/05/2007  . DEGENERATIVE JOINT DISEASE 12/05/2007  . SLEEP APNEA 12/05/2007   Discharged Condition: good  History of Present Illness:  [name] is a 70 yo male with history of coronary artery  disease status post coronary artery bypass grafting by Dr. Cyndia Bent  in 1995, hypertension, chronic diastolic congestive heart failure, hyperlipidemia, type 2 diabetes mellitus, and obstructive sleep apnea who has been referred for a second surgical opinion to discuss treatment options for management of severe symptomatic mitral regurgitation. The patient underwent coronary artery bypass grafting in 1995. Although details of the procedure are not currently available, grafts placed at the time of surgery included saphenous vein graft to the obtuse marginal branch and saphenous vein graft to the posterior descending coronary artery. He states that he recovered uneventfully. Since then he has done well from a cardiac standpoint until recently. He was followed for many years by Dr. Maurene Capes, and more recently he has been followed by Dr. Aundra Dubin. Over the past year the patient complains of progressive symptoms of exertional shortness of breath and fatigue. Symptoms have gotten considerably worse over the past 6-8 weeks, and the patient has began to experience significant fluid retention. He was seen in follow-up by Dr. Aundra Dubin in early May and his dose of Lasix was doubled. He underwent nuclear stress test 12/03/2015 that revealed previous inferior wall infarction with mild peri-infarct ischemia and resting ejection fraction estimated 47%. There were no ST segment changes noted during stress. Transthoracic echocardiogram performed 01/08/2016 revealed normal left ventricular systolic function with at least moderate mitral regurgitation that was eccentric and directed posteriorly. The patient subsequently underwent transesophageal echocardiogram on 01/23/2016 by Dr. Aundra Dubin. Ejection fraction was estimated 55-60%. There was severe mitral regurgitation with an eccentric, posteriorly directed jet. There was no flow reversal in the pulmonary veins. The posterior leaflet appeared somewhat restricted and there may be some  associated prolapse of the anterior leaflet. Follow-up catheterization performed 01/23/2016 revealed continued patency of vein graft to the posterior descending coronary artery and the obtuse marginal branch of the left circumflex coronary artery. There is high-grade stenosis in the distal segment of a diagonal branch that was unchanged from previous catheterizations. The left anterior descending coronary artery was relatively small and free of disease with exception of a discrete 90% lesion in the very distal portion of the vessel. Pulmonary artery pressures were mild to moderately elevated. The patient was referred for surgical consultation and previously evaluated by Dr. Cyndia Bent who referred the patient to Dr. Roxy Manns for a second surgical opinion to discuss the possibility of minimally invasive approach for mitral valve repair.  He was evaluated by Dr. Roxy Manns who felt a minimally invasive approach was feasible.  The risks and benefits of the procedure were explained to the patient and he was agreeable to proceed.  Hospital Course:   Mr. Reisdorf presented to [facility] on [facility]/25/2016.  He was taken to the operating room and underwent Mitral Valve Repair with a 26 mm Sorin Memo 3D ring.  He tolerated the procedure without difficulty and was taken to the SICU in stable condition.  The patient was extubated the evening of surgery.  During his stay in the SICU the patient progressed with minimal difficulty.  He was maintaining NSR.  His arterial lines were removed without difficulty.  He was started on low dose coumadin therapy for his mitral valve repair.  PT and INR were monitored daily. His most recent INR was 1.58 and he is on 5 mg of Coumadin. He was ambulating with minimal difficulty and felt stable for transfer to the step down unit on POD #2.  Chest tubes were removed on 03/28/2016. Follow up chest x ray this am showed small right apical pneumothorax and subcutaneous emphysema right chest wall, neck.  He  underwent chest tube placement, however due to pleural adhesions this was difficult to place.  Follow up CXR has remained stable and his chest tube was removed on 03/31/2016.  He had ABL anemia.Marland Kitchen His last H and H was 10.6 and 32.1. He also had mild thrombocytopenia. His last platelet count was 114,000.  He is maintaining NSR.  His pacing wires have been removed.  He is ambulating without difficulty.  He is felt medically stable for discharge home today.  Significant Diagnostic Studies: angiography:   Left ventricle: Wall thickness was increased in a pattern of   moderate LVH. Left ventricular diastolic function parameters were   normal. - Mitral valve: Restricted posterior leaflet motion Variable   eccentric MR ? mild to moderate. Consider TEE to further assess. - Left atrium: The atrium was moderately dilated. - Atrial septum: No defect or patent foramen ovale was identified. - Pulmonary arteries: PA peak pressure: 37 mm Hg (S).  Treatments: surgery:    Minimally-Invasive Reoperation for Mitral Valve Repair                       Complex valvuloplasty including artificial Gore-tex neochord placement x4                       Plication of anterior commissure                       Sorin Memo 3D Ring Annuloplasty (size 42mm, catalog # [id number], serial # [id number])   Disposition: 01-Home or Self Care  Discharge Medications:   Discharge Instructions    Amb Referral to Cardiac Rehabilitation    Complete by:  As directed   Diagnosis:  Valve Repair Comment - min invasive   Valve:  Mitral       Medication List    TAKE these medications   amiodarone 200 MG tablet Commonly known as:  PACERONE Take 1 tablet (200 mg total) by mouth daily. What changed:  when to take this   aspirin EC 81 MG tablet Take 81 mg by mouth daily.   carvedilol 3.125 MG tablet Commonly known as:  COREG Take 1 tablet (3.125 mg total) by mouth 2 (two) times daily. What changed:  See the new instructions.   CENTRUM  ADULTS PO Take 1 tablet by mouth daily.   cetirizine 10 MG chewable tablet Commonly known as:  ZYRTEC Chew 10 mg by mouth daily.   fluticasone 50 MCG/ACT nasal spray Commonly known as:  FLONASE Place 1 spray into both nostrils daily as needed for allergies or rhinitis.   furosemide 40 MG tablet Commonly known as:  LASIX Take 1 tablet (40mg ) by mouth in the morning and 1/2 tablet (20mg ) about 4PM What changed:  how much to take  how to take this  when to take this  additional instructions   HYDROcodone-acetaminophen 10-325 MG tablet Commonly known as:  NORCO Take 1 tablet by mouth every 4 (four) hours as needed for moderate pain. What changed:  when to take this   metFORMIN 500 MG tablet Commonly known as:  GLUCOPHAGE Take 500 mg by mouth 2 (two) times daily with a meal.   nitroGLYCERIN 0.4 MG SL tablet Commonly known as:  NITROSTAT Place 1 tablet (0.4 mg total) under the tongue every 5 (five) minutes as needed for chest pain.   potassium chloride SA 20 MEQ tablet Commonly known as:  K-DUR,KLOR-CON Take 1 tablet (20 mEq total) by  mouth 2 (two) times daily. What changed:  when to take this   rosuvastatin 10 MG tablet Commonly known as:  CRESTOR Take 10 mg by mouth daily.   tadalafil 20 MG tablet Commonly known as:  CIALIS Take 20 mg by mouth daily as needed for erectile dysfunction.   testosterone cypionate 200 MG/ML injection Commonly known as:  DEPOTESTOSTERONE CYPIONATE Inject 100 mg into the muscle every Tuesday.   traZODone 100 MG tablet Commonly known as:  DESYREL Take 100 mg by mouth at bedtime as needed for sleep.   VENTOLIN HFA 108 (90 Base) MCG/ACT inhaler Generic drug:  albuterol Inhale 1 puff into the lungs every 4 (four) hours as needed for wheezing or shortness of breath.   warfarin 5 MG tablet Commonly known as:  COUMADIN Take 1 tablet (5 mg total) by mouth daily at 6 PM.      The patient has been discharged on:   1.Beta Blocker:   Yes [ x  ]                              No   [   ]                              If No, reason:  2.Ace Inhibitor/ARB: Yes [   ]                                     No  [  x  ]                                     If No, reason: Elevated creatinine  3.Statin:   Yes [x   ]                  No  [   ]                  If No, reason:  4.Ecasa:  Yes  [x   ]                  No   [   ]                  If No, reason:  Follow-up Information    [name], MD Follow up on 04/13/2016.   Specialty:  Cardiothoracic Surgery Why:  Appointment is at 2:30, please get CXR at 2:00 at [facility] located on first floor of [facility] medical center Contact information: [place] [address] [phone]        [facility] Follow up on 04/03/2016.   Specialty:  Cardiology Why:  Appointment is at 9:30 Contact information: [address], [address] [place]       [name], MD Follow up on 04/10/2016.   Specialty:  Cardiology Why:  Appointment is at 1:45 Contact information: [address] [place] [address] [phone]           Signed: Caryn Bee 04/01/2016, 9:47 AM

## 2016-03-27 NOTE — Progress Notes (Signed)
Report to 2 W RN 

## 2016-03-27 NOTE — Care Management Note (Signed)
Case Management Note  Patient Details  Name: [name] MRN: [id number] Date of Birth: 07-20-1946  Subjective/Objective:      Pt admitted s/p MINI MVR               Action/Plan:  PTA independent from home with wife - occasional use of walker and cane already in the home.  Wife will be with pt post discharge.  CM will continue to follow for discharge needs   Expected Discharge Date:                  Expected Discharge Plan:  Home/Self Care  In-House Referral:     Discharge planning Services  CM Consult  Post Acute Care Choice:    Choice offered to:     DME Arranged:    DME Agency:     HH Arranged:    HH Agency:     Status of Service:  In process, will continue to follow  If discussed at Long Length of Stay Meetings, dates discussed:    Additional Comments:  Maryclare Labrador, RN 03/27/2016, 2:27 PM

## 2016-03-27 NOTE — Plan of Care (Signed)
Dr. Roxy Manns notified of HR 40-60's and VVI back up pacer at 60bpm. OK to transfer with backup pacer.

## 2016-03-27 NOTE — Progress Notes (Signed)
Patient lying in bed, no needs at this time, call light within reach 

## 2016-03-27 NOTE — Progress Notes (Signed)
Pt states that he can handle his home CPAP device. o2 humidity provided.

## 2016-03-27 NOTE — Progress Notes (Signed)
Transferred to 2W20 via [place] and monitor, SCD's with pt, Ct hooked to 20 cm suction, RN to receive in room

## 2016-03-27 NOTE — Progress Notes (Signed)
Patient in a fib but HR dropping to 40's, VVI pacer on and set at 60bpm.

## 2016-03-27 NOTE — Progress Notes (Signed)
[address]       [place] [address]             [phone]        CARDIOTHORACIC SURGERY PROGRESS NOTE   R2 Days Post-Op Procedure(s) (LRB): MINIMALLY INVASIVE REOPERATION FOR MITRAL VALVE REPAIR  (MVR) with size 26 Sorin Memo 3D (Right) TRANSESOPHAGEAL ECHOCARDIOGRAM (TEE) (N/A)  Subjective: No complaints.  Looks good.  Objective: Vital signs: BP Readings from Last 1 Encounters:  03/27/16 101/64   Pulse Readings from Last 1 Encounters:  03/27/16 76   Resp Readings from Last 1 Encounters:  03/27/16 16   Temp Readings from Last 1 Encounters:  03/27/16 98.2 F (36.8 C) (Oral)    Hemodynamics: PAP: (40)/(15-18) 40/15  Physical Exam:  Rhythm:   sinus  Breath sounds: clear  Heart sounds:  RRR w/out murmur  Incisions:  Dressings dry, intact  Abdomen:  Soft, non-distended, non-tender  Extremities:  Warm, well-perfused  Chest tubes:  decreasing volume thin serosanguinous output, no air leak    Intake/Output from previous day: 08/03 0701 - 08/04 0700 In: 757.6 [P.O.:240; I.V.:317.6; IV Piggyback:200] Out: 4090 [Urine:3690; Chest Tube:400] Intake/Output this shift: No intake/output data recorded.  Lab Results:  CBC: Recent Labs  03/26/16 1839 03/27/16 0400  WBC 9.4 8.5  HGB 10.6* 10.0*  HCT 32.6* 29.8*  PLT 99* 101*    BMET:  Recent Labs  03/26/16 0411 03/26/16 1728  03/26/16 1839 03/27/16 0400  NA 139 136  --   --  136  K 4.0 3.8  --   --  3.5  CL 110 99*  --   --  104  CO2 22  --   --   --  26  GLUCOSE 106* 139*  --   --  105*  BUN 12 12  --   --  11  CREATININE 1.34* 1.50*  < > 1.62* 1.42*  CALCIUM 7.7*  --   --   --  7.8*  < > = values in this interval not displayed.   PT/INR:   Recent Labs  03/27/16 0400  LABPROT 16.7*  INR 1.34    CBG (last 3)   Recent Labs  03/26/16 1926 03/26/16 2338 03/27/16 0327  GLUCAP 129* 111* 120*    ABG    Component Value Date/Time   PHART 7.317 (L) 03/25/2016 2317     PCO2ART 41.5 03/25/2016 2317   PO2ART 83.0 03/25/2016 2317   HCO3 21.1 03/25/2016 2317   TCO2 22 03/26/2016 1728   ACIDBASEDEF 5.0 (H) 03/25/2016 2317   O2SAT 95.0 03/25/2016 2317    CXR: PORTABLE CHEST 1 VIEW  COMPARISON:  One-view chest x-ray 03/26/2016  FINDINGS: Median sternotomy for CABG is noted. The Swan-Ganz catheter has been removed. A left IJ sheath remains. A right-sided chest tube remains in place. There is no pneumothorax. Aeration at the bases is slightly improved. Lung volumes remain low. Bibasilar atelectasis is evident. Epicardial pacing wires are in place.  IMPRESSION: 1. Interval removal of Swan-Ganz catheter. 2. Low lung volumes with slight improved aeration.   Electronically Signed   By: San Morelle M.D.   On: 03/27/2016 07:37   Assessment/Plan: S/P Procedure(s) (LRB): MINIMALLY INVASIVE REOPERATION FOR MITRAL VALVE REPAIR  (MVR) with size 42 Sorin Memo 3D (Right) TRANSESOPHAGEAL ECHOCARDIOGRAM (TEE) (N/A)  Doing very well POD2 Maintaining NSR w/ stable BP Breathing comfortably w/ O2 sats 94-97% on RA Expected post op acute blood loss anemia, very mild,  stable Chronic diastolic CHF with expected post-op volume excess Type II diabetes mellitus, excellent glycemic control off insulin drip CAD s/p CABG in remote past Hypertension OSA on CPAP at home Obesity   Mobilize  Diuresis  Leave chest tubes in today  Continue Levemir and change CBG's and SSI to ac/hs  Coumadin  Transfer step down  [name], MD 03/27/2016 7:47 AM

## 2016-03-28 LAB — GLUCOSE, CAPILLARY
GLUCOSE-CAPILLARY: 137 mg/dL — AB (ref 65–99)
Glucose-Capillary: 137 mg/dL — ABNORMAL HIGH (ref 65–99)
Glucose-Capillary: 148 mg/dL — ABNORMAL HIGH (ref 65–99)
Glucose-Capillary: 170 mg/dL — ABNORMAL HIGH (ref 65–99)

## 2016-03-28 LAB — CBC
HEMATOCRIT: 32.1 % — AB (ref 39.0–52.0)
HEMOGLOBIN: 10.6 g/dL — AB (ref 13.0–17.0)
MCH: 30.5 pg (ref 26.0–34.0)
MCHC: 33 g/dL (ref 30.0–36.0)
MCV: 92.2 fL (ref 78.0–100.0)
Platelets: 114 10*3/uL — ABNORMAL LOW (ref 150–400)
RBC: 3.48 MIL/uL — AB (ref 4.22–5.81)
RDW: 13.4 % (ref 11.5–15.5)
WBC: 9.1 10*3/uL (ref 4.0–10.5)

## 2016-03-28 LAB — BASIC METABOLIC PANEL
ANION GAP: 6 (ref 5–15)
BUN: 12 mg/dL (ref 6–20)
CALCIUM: 8.6 mg/dL — AB (ref 8.9–10.3)
CHLORIDE: 100 mmol/L — AB (ref 101–111)
CO2: 30 mmol/L (ref 22–32)
Creatinine, Ser: 1.4 mg/dL — ABNORMAL HIGH (ref 0.61–1.24)
GFR calc non Af Amer: 49 mL/min — ABNORMAL LOW (ref >60)
GFR, EST AFRICAN AMERICAN: 57 mL/min — AB (ref >60)
GLUCOSE: 163 mg/dL — AB (ref 65–99)
POTASSIUM: 4.4 mmol/L (ref 3.5–5.1)
Sodium: 136 mmol/L (ref 135–145)

## 2016-03-28 LAB — PROTIME-INR
INR: 1.21
Prothrombin Time: 15.4 seconds — ABNORMAL HIGH (ref 11.4–15.2)

## 2016-03-28 MED ORDER — WARFARIN VIDEO
Freq: Once | Status: AC
  Administered 2016-03-28: 16:00:00

## 2016-03-28 MED ORDER — AMIODARONE HCL 200 MG PO TABS
200.0000 mg | ORAL_TABLET | Freq: Every day | ORAL | Status: DC
  Administered 2016-03-29 – 2016-04-01 (×4): 200 mg via ORAL
  Filled 2016-03-28 (×4): qty 1

## 2016-03-28 MED ORDER — CARVEDILOL 3.125 MG PO TABS
3.1250 mg | ORAL_TABLET | Freq: Two times a day (BID) | ORAL | Status: DC
  Administered 2016-03-28 – 2016-04-01 (×9): 3.125 mg via ORAL
  Filled 2016-03-28 (×9): qty 1

## 2016-03-28 MED ORDER — COUMADIN BOOK
Freq: Once | Status: AC
  Administered 2016-03-28: 11:00:00
  Filled 2016-03-28: qty 1

## 2016-03-28 NOTE — Progress Notes (Signed)
CARDIAC REHAB PHASE I   Pt has ambulated twice today with family, no complaints. Pt declines ambulation with cardiac rehab at this time, states he will walk with his wife later this afternoon. Cardiac surgery discharge education completed. Reviewed risk factors, IS, restrictions, activity progression, exercise, heart healty diet, carb counting, sodium restrictions, daily weights, s/s heart failure and phase 2 cardiac rehab. Pt verbalized understanding. Pt agrees to phase 2 cardiac rehab referral, will send to [facility] per pt request. Pt in bed, call bell within reach.  [place], RN, BSN 03/28/2016 2:15 PM

## 2016-03-28 NOTE — Progress Notes (Signed)
Patient ambulated in hallway with wife. Back in room call bell within reach will montior patient. [name], [name] RN

## 2016-03-28 NOTE — Progress Notes (Signed)
Both Chest tubes removed as ordered. Patient tolerated well, dressing applied will monitor patient. Aries Kasa, [name] RN

## 2016-03-28 NOTE — Progress Notes (Signed)
Patient has watched the coumadin video with spouse and has been given the coumadin book. [name], [name] RN

## 2016-03-28 NOTE — Progress Notes (Signed)
Patient ambulated in hallway with wife and walker. Will monitor patient. Zedric Deroy, [name] RN

## 2016-03-28 NOTE — Progress Notes (Addendum)
      [address]       [place] [address]             [phone]        3 Days Post-Op Procedure(s) (LRB): MINIMALLY INVASIVE REOPERATION FOR MITRAL VALVE REPAIR  (MVR) with size 26 Sorin Memo 3D (Right) TRANSESOPHAGEAL ECHOCARDIOGRAM (TEE) (N/A)  Subjective: Patient with no specific complaints.  Objective: Vital signs in last 24 hours: Temp:  [97.3 F (36.3 C)-98.7 F (37.1 C)] 98.7 F (37.1 C) (08/05 0419) Pulse Rate:  [47-81] 60 (08/05 0419) Cardiac Rhythm: Ventricular paced;Atrial fibrillation (08/05 0721) Resp:  [13-23] 20 (08/05 0419) BP: (87-111)/(55-72) 108/63 (08/05 0419) SpO2:  [91 %-100 %] 96 % (08/05 0419) Weight:  [224 lb 4.8 oz (101.7 kg)] 224 lb 4.8 oz (101.7 kg) (08/05 0419)  Pre op weight 98.9 kg Current Weight  03/28/16 224 lb 4.8 oz (101.7 kg)       Intake/Output from previous day: 08/04 0701 - 08/05 0700 In: 1040 [P.O.:840; IV Piggyback:200] Out: [id number] [Urine:1325; Chest Tube:100]   Physical Exam:  Cardiovascular: RRR Pulmonary: Slightly diminished at bases Abdomen: Soft, non tender, bowel sounds present. Extremities: Mild bilateral lower extremity edema. Wounds: Dressing removed and wound is clean and dry.  No erythema or signs of infection.  Lab Results: CBC: Recent Labs  03/27/16 0400 03/28/16 0259  WBC 8.5 9.1  HGB 10.0* 10.6*  HCT 29.8* 32.1*  PLT 101* 114*   BMET:  Recent Labs  03/27/16 0400 03/28/16 0259  NA 136 136  K 3.5 4.4  CL 104 100*  CO2 26 30  GLUCOSE 105* 163*  BUN 11 12  CREATININE 1.42* 1.40*  CALCIUM 7.8* 8.6*    PT/INR:  Lab Results  Component Value Date   INR 1.21 03/28/2016   INR 1.34 03/27/2016   INR 1.39 03/25/2016   ABG:  INR: Will add last result for INR, ABG once components are confirmed Will add last 4 CBG results once components are confirmed  Assessment/Plan:  1. CV - SR in the 70's, occasionally V paced. On Amiodarone 200 mg bid,Coreg 6.25 mg bid, and  Coumadin. Will decrease Coreg secondary to HR. INR decreased from 1.34 to 1.21. Await am's INR to determine if should increase Coumadin. 2.  Pulmonary - On room air. Chest tube with 100 cc of output last 24 hours. Will remove chest tubes.Encourage incentive spirometer. Will order PA/LAT CXR for am. 3. Volume Overload - On Lasix 40 mg bid 4.  Acute blood loss anemia - H and H stable at 10.6 and 32.1 5. Mild thrombocytopenia-platelets up to 114,000 6. Creatinine remains 1.4 (admiision creatinine was 1.42) 7. DM-CBGs 87/131/137. Pre op HGA1C 7.2. On Insulin. Was on Metformin pre op. Will restart once decrease Lasix to help creatinine remain stable.  ZIMMERMAN,DONIELLE MPA-C 03/28/2016,8:10 AM  I have seen and examined the patient and agree with the assessment and plan as outlined.  Decrease amiodarone to 200 mg/day.  [name], MD 03/28/2016 11:13 AM

## 2016-03-29 ENCOUNTER — Inpatient Hospital Stay (HOSPITAL_COMMUNITY): Payer: Medicare Other

## 2016-03-29 DIAGNOSIS — J939 Pneumothorax, unspecified: Secondary | ICD-10-CM | POA: Diagnosis not present

## 2016-03-29 DIAGNOSIS — J95811 Postprocedural pneumothorax: Secondary | ICD-10-CM

## 2016-03-29 LAB — GLUCOSE, CAPILLARY
GLUCOSE-CAPILLARY: 105 mg/dL — AB (ref 65–99)
GLUCOSE-CAPILLARY: 138 mg/dL — AB (ref 65–99)
GLUCOSE-CAPILLARY: 147 mg/dL — AB (ref 65–99)
Glucose-Capillary: 195 mg/dL — ABNORMAL HIGH (ref 65–99)
Glucose-Capillary: 182 mg/dL — ABNORMAL HIGH (ref 65–99)

## 2016-03-29 LAB — PROTIME-INR
INR: 1.2
PROTHROMBIN TIME: 15.2 s (ref 11.4–15.2)

## 2016-03-29 MED ORDER — MIDAZOLAM HCL 2 MG/2ML IJ SOLN
INTRAMUSCULAR | Status: AC
  Administered 2016-03-29: 2 mg
  Filled 2016-03-29: qty 2

## 2016-03-29 MED ORDER — WARFARIN SODIUM 5 MG PO TABS
5.0000 mg | ORAL_TABLET | Freq: Every day | ORAL | Status: DC
  Administered 2016-03-29 – 2016-03-31 (×3): 5 mg via ORAL
  Filled 2016-03-29 (×3): qty 1

## 2016-03-29 MED ORDER — LIDOCAINE HCL (PF) 1 % IJ SOLN
INTRAMUSCULAR | Status: AC
  Administered 2016-03-29: 13:00:00
  Filled 2016-03-29: qty 30

## 2016-03-29 MED ORDER — METFORMIN HCL 500 MG PO TABS
500.0000 mg | ORAL_TABLET | Freq: Two times a day (BID) | ORAL | Status: DC
  Administered 2016-03-29 – 2016-04-01 (×6): 500 mg via ORAL
  Filled 2016-03-29 (×6): qty 1

## 2016-03-29 MED ORDER — LIDOCAINE HCL (PF) 1 % IJ SOLN
INTRAMUSCULAR | Status: AC
  Filled 2016-03-29: qty 5

## 2016-03-29 MED ORDER — LACTULOSE 10 GM/15ML PO SOLN
20.0000 g | Freq: Once | ORAL | Status: AC
  Administered 2016-03-29: 20 g via ORAL
  Filled 2016-03-29: qty 30

## 2016-03-29 NOTE — Progress Notes (Addendum)
Radiologist called regarding recent chest xray after chest tube placement. And stated results in EPIC. Dr. Roxy Manns paged and made aware no new orders received at this time. Will continue to monitor patient. Tommaso Cavitt, [name] RN

## 2016-03-29 NOTE — Progress Notes (Addendum)
CARDIOTHORACIC SURGERY OPERATIVE NOTE  Date of Procedure:  03/29/2016  Preoperative Diagnosis: Right Pneumothorax  Postoperative Diagnosis: Same  Procedure:   Right chest tube placement  Surgeon:   Valentina Gu. Roxy Manns, MD  Anesthesia:  1% lidocaine local with intravenous sedation    DETAILS OF THE OPERATIVE PROCEDURE  Following full informed consent the patient was given midazolam 2 mg intravenously and continuously monitored for rhythm, BP and oxygen saturation. The right chest was prepared and draped in a sterile manner. 1% lidocaine was utilized to anesthetize the skin and subcutaneous tissues. The previous right lateral chest tube incision was reopened and a 28 French straight chest tube was placed through the incision into the pleural space. The tube would not advance far into the pleural space due to adhesions that were palpable by finger exploration.  The tube was secured to the skin and connected to a closed suction collection device. The patient tolerated the procedure well. A portable CXR was ordered. There were no complications.    Valentina Gu. Roxy Manns, MD 03/29/2016 12:35 PM

## 2016-03-29 NOTE — Progress Notes (Signed)
Dressing to chest tube site saturated. Dressing changed and no air leak noted. Will continue to monitor patient. Jareb Radoncic, [name] RN

## 2016-03-29 NOTE — Progress Notes (Signed)
Patient ambulated in hallway with wife back in room will monitor patient. Ma Munoz, [name] RN

## 2016-03-29 NOTE — Progress Notes (Signed)
Patient's chest tube dressing saturated with serosanguinous fluid dressing changed and new dressing applied, will monitor patient. Fynn Vanblarcom, [name] RN

## 2016-03-29 NOTE — Progress Notes (Addendum)
      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 Only complaint is "rubbing on right nipple".  Objective: Vital signs in last 24 hours: Temp:  [98.6 F (37 C)-99.1 F (37.3 C)] 99.1 F (37.3 C) (08/06 0428) Pulse Rate:  [76-80] 80 (08/06 0428) Cardiac Rhythm: Atrial fibrillation (08/06 0718) Resp:  [18-20] 20 (08/06 0428) BP: (99-123)/(72-85) 99/85 (08/06 0428) SpO2:  [98 %-100 %] 98 % (08/06 0428) Weight:  [221 lb 11.2 oz (100.6 kg)] 221 lb 11.2 oz (100.6 kg) (08/06 0428)  Pre op weight 98.9 kg Current Weight  03/29/16 221 lb 11.2 oz (100.6 kg)       Intake/Output from previous day: 08/05 0701 - 08/06 0700 In: 720 [P.O.:720] Out: 1370 [Urine:1250; Chest Tube:120]   Physical Exam:  Cardiovascular: [facility] Pulmonary: Slightly diminished at bases, subcutaneous emphysema anterior/posterior right chest wall and neck Abdomen: Soft, non tender, bowel sounds present. Extremities:Trace bilateral lower extremity edema. Wounds: Wound is clean and dry.  No erythema or signs of infection.  Lab Results: CBC:  Recent Labs  03/27/16 0400 03/28/16 0259  WBC 8.5 9.1  HGB 10.0* 10.6*  HCT 29.8* 32.1*  PLT 101* 114*   BMET:   Recent Labs  03/27/16 0400 03/28/16 0259  NA 136 136  K 3.5 4.4  CL 104 100*  CO2 26 30  GLUCOSE 105* 163*  BUN 11 12  CREATININE 1.42* 1.40*  CALCIUM 7.8* 8.6*    PT/INR:  Lab Results  Component Value Date   INR 1.20 03/29/2016   INR 1.21 03/28/2016   INR 1.34 03/27/2016   ABG:  INR: Will add last result for INR, ABG once components are confirmed Will add last 4 CBG results once components are confirmed  Assessment/Plan:  1. CV - AF in the 60-70's. On  Amiodarone 200 mg daily,Coreg 3.125 mg bid, and Coumadin. INR slightly decreased from 1.21 to 1.20 so will increase Coumadin. 2.  Pulmonary - On room air. CXR this am appears to show small right apical pneumothorax,subcutaneous emphysema right lateral chest wall and neck, small effusions. Check CXR in am.Encourage incentive spirometer. 3. Volume Overload - On Lasix 40 mg bid and hope to decrease to daily soon 4.  Acute blood loss anemia - H and H stable at 10.6 and 32.1 5. Mild thrombocytopenia-platelets up to 114,000 6. Creatinine remains 1.4 (admiision creatinine was 1.42) 7. DM-CBGs 137/170/105. Pre op HGA1C 7.2. On Insulin. Was on Metformin pre op. Will restart once decrease Lasix to help creatinine remain stable.  ZIMMERMAN,DONIELLE MPA-C 03/29/2016,7:32 AM    I have seen and examined the patient and agree with the assessment and plan as outlined.  Will get repeat CXR later today and tomorrow morning.  May need chest tube replaced if PTX or subQ air gets any worse.  D/C pacing wires.  Increase coumadin.  Resume metformin and stop levemir insulin.  [name], MD 03/29/2016 10:23 AM

## 2016-03-29 NOTE — Discharge Instructions (Signed)
Mitral Valve Repair, Care After Refer to this sheet in the next few weeks. These instructions provide you with information on caring for yourself after your procedure. Your health care provider may also give you specific instructions. Your treatment has been planned according to current medical practices, but problems sometimes occur. Call your health care provider if you have any problems or questions after your procedure.  HOME CARE INSTRUCTIONS   Take medicines only as directed by your health care provider.  Take your temperature every morning for the first 7 days after surgery. Write these down.  Weigh yourself every morning for at least 7 days after surgery. Write your weight down.  Wear elastic stockings during the day for at least 2 weeks after surgery or as directed by your health care provider. Use them longer if your ankles are swollen. The stockings help blood flow and help reduce swelling in the legs.  Take frequent naps or rest often throughout the day.  Avoid lifting more than 10 lb (4.5 kg) or pushing or pulling things with your arms for 6-8 weeks or as directed by your health care provider.  Avoid driving or airplane travel for 4-6 weeks after surgery or as directed. If you are riding in a car for an extended period, stop every 1-2 hours to stretch your legs.  Avoid crossing your legs.  Avoid climbing stairs and using the handrail to pull yourself up for the first 2-3 weeks after surgery.  Do not take baths for 2-4 weeks after surgery. Take showers once your health care provider approves. Pat incisions dry. Do not rub incisions with a washcloth or towel.  Return to work as directed by your health care provider.  Drink enough fluids to keep your urine clear or pale yellow.  Do not strain to have a bowel movement. Eat high-fiber foods if you become constipated. You may also take a medicine to help you have a bowel movement (laxative) as directed by your health care  provider.  Resume sexual activity as directed by your health care provider. SEEK MEDICAL CARE IF:   You develop a skin rash.   Your weight is increasing each day over 2-3 days.  Your weight increases by 2 or more pounds (1 kg) in a single day.  You have a fever. SEEK IMMEDIATE MEDICAL CARE IF:   You develop chest pain that is not coming from your incision.  You develop shortness of breath or difficulty breathing.  You have drainage, redness, swelling, or pain at your incision site.  You have pus coming from your incision.  You develop light-headedness. MAKE SURE YOU:  Understand these directions.  Will watch your condition.  Will get help right away if you are not doing well or get worse.   This information is not intended to replace advice given to you by your health care provider. Make sure you discuss any questions you have with your health care provider.   Document Released: 02/27/2005 Document Revised: 08/31/2014 Document Reviewed: 01/10/2013 Elsevier Interactive Patient Education 2016 [place] on my medicine - Coumadin   (Warfarin)  This medication education was reviewed with me or my healthcare representative as part of my discharge preparation.  The pharmacist that spoke with me during my hospital stay was:  Einar Grad, [facility]  Why was Coumadin prescribed for you? Coumadin was prescribed for you because you have a blood clot or a medical condition that can cause an increased risk of forming blood clots. Blood  clots can cause serious health problems by blocking the flow of blood to the heart, lung, or brain. Coumadin can prevent harmful blood clots from forming. As a reminder your indication for Coumadin is:   Stroke Prevention Because Of Atrial Fibrillation  What test will check on my response to Coumadin? While on Coumadin (warfarin) you will need to have an INR test regularly to ensure that your dose is keeping you in the desired range.  The INR (international normalized ratio) number is calculated from the result of the laboratory test called prothrombin time (PT).  If an INR APPOINTMENT HAS NOT ALREADY BEEN MADE FOR YOU please schedule an appointment to have this lab work done by your health care provider within 7 days. Your INR goal is usually a number between:  2 to 3 or your provider may give you a more narrow range like 2-2.5.  Ask your health care provider during an office visit what your goal INR is.  What  do you need to  know  About  COUMADIN? Take Coumadin (warfarin) exactly as prescribed by your healthcare provider about the same time each day.  DO NOT stop taking without talking to the doctor who prescribed the medication.  Stopping without other blood clot prevention medication to take the place of Coumadin may increase your risk of developing a new clot or stroke.  Get refills before you run out.  What do you do if you miss a dose? If you miss a dose, take it as soon as you remember on the same day then continue your regularly scheduled regimen the next day.  Do not take two doses of Coumadin at the same time.  Important Safety Information A possible side effect of Coumadin (Warfarin) is an increased risk of bleeding. You should call your healthcare provider right away if you experience any of the following: ? Bleeding from an injury or your nose that does not stop. ? Unusual colored urine (red or dark brown) or unusual colored stools (red or black). ? Unusual bruising for unknown reasons. ? A serious fall or if you hit your head (even if there is no bleeding).  Some foods or medicines interact with Coumadin (warfarin) and might alter your response to warfarin. To help avoid this: ? Eat a balanced diet, maintaining a consistent amount of Vitamin K. ? Notify your provider about major diet changes you plan to make. ? Avoid alcohol or limit your intake to 1 drink for women and 2 drinks for men per day. (1 drink is  5 oz. wine, 12 oz. beer, or 1.5 oz. liquor.)  Make sure that ANY health care provider who prescribes medication for you knows that you are taking Coumadin (warfarin).  Also make sure the healthcare provider who is monitoring your Coumadin knows when you have started a new medication including herbals and non-prescription products.  Coumadin (Warfarin)  Major Drug Interactions  Increased Warfarin Effect Decreased Warfarin Effect  Alcohol (large quantities) Antibiotics (esp. Septra/Bactrim, Flagyl, Cipro) Amiodarone (Cordarone) Aspirin (ASA) Cimetidine (Tagamet) Megestrol (Megace) NSAIDs (ibuprofen, naproxen, etc.) Piroxicam (Feldene) Propafenone (Rythmol SR) Propranolol (Inderal) Isoniazid (INH) Posaconazole (Noxafil) Barbiturates (Phenobarbital) Carbamazepine (Tegretol) Chlordiazepoxide (Librium) Cholestyramine (Questran) Griseofulvin Oral Contraceptives Rifampin Sucralfate (Carafate) Vitamin K   Coumadin (Warfarin) Major Herbal Interactions  Increased Warfarin Effect Decreased Warfarin Effect  Garlic Ginseng Ginkgo biloba Coenzyme Q10 Green tea St. Johns wort    Coumadin (Warfarin) FOOD Interactions  Eat a consistent number of servings per week of foods HIGH in Vitamin  K (1 serving =  cup)  Collards (cooked, or boiled & drained) Kale (cooked, or boiled & drained) Mustard greens (cooked, or boiled & drained) Parsley *serving size only =  cup Spinach (cooked, or boiled & drained) Swiss chard (cooked, or boiled & drained) Turnip greens (cooked, or boiled & drained)  Eat a consistent number of servings per week of foods MEDIUM-HIGH in Vitamin K (1 serving = 1 cup)  Asparagus (cooked, or boiled & drained) Broccoli (cooked, boiled & drained, or raw & chopped) Brussel sprouts (cooked, or boiled & drained) *serving size only =  cup Lettuce, raw (green leaf, endive, romaine) Spinach, raw Turnip greens, raw & chopped   These websites have more information on  Coumadin (warfarin):  FailFactory.se; VeganReport.com.au;

## 2016-03-29 NOTE — Progress Notes (Addendum)
EPW pulled By Dr. Roxy Manns after chest tube placement, will monitor patient. Christyanna Mckeon, [name] RN

## 2016-03-29 NOTE — Progress Notes (Addendum)
Right chest tube placement by Dr. Roxy Manns at bedside, medication given as ordered per Dr Roxy Manns, Versed 2mg  and Morphine IV 2mg , given  Patient  BP and oxygen saturation monitored frequently during procedure Patient resting comfortably now, chest tube to suction as ordered. Will continue to monitor patient. Alakai Macbride, [name] RN

## 2016-03-30 ENCOUNTER — Inpatient Hospital Stay (HOSPITAL_COMMUNITY): Payer: Medicare Other

## 2016-03-30 LAB — BASIC METABOLIC PANEL
ANION GAP: 6 (ref 5–15)
BUN: 14 mg/dL (ref 6–20)
CALCIUM: 8.6 mg/dL — AB (ref 8.9–10.3)
CO2: 30 mmol/L (ref 22–32)
Chloride: 98 mmol/L — ABNORMAL LOW (ref 101–111)
Creatinine, Ser: 1.51 mg/dL — ABNORMAL HIGH (ref 0.61–1.24)
GFR calc Af Amer: 52 mL/min — ABNORMAL LOW (ref >60)
GFR calc non Af Amer: 45 mL/min — ABNORMAL LOW (ref >60)
GLUCOSE: 124 mg/dL — AB (ref 65–99)
Potassium: 3.5 mmol/L (ref 3.5–5.1)
Sodium: 134 mmol/L — ABNORMAL LOW (ref 135–145)

## 2016-03-30 LAB — GLUCOSE, CAPILLARY
GLUCOSE-CAPILLARY: 158 mg/dL — AB (ref 65–99)
Glucose-Capillary: 137 mg/dL — ABNORMAL HIGH (ref 65–99)
Glucose-Capillary: 173 mg/dL — ABNORMAL HIGH (ref 65–99)
Glucose-Capillary: 175 mg/dL — ABNORMAL HIGH (ref 65–99)

## 2016-03-30 LAB — PROTIME-INR
INR: 1.22
Prothrombin Time: 15.5 seconds — ABNORMAL HIGH (ref 11.4–15.2)

## 2016-03-30 LAB — CBC
HEMATOCRIT: 30.5 % — AB (ref 39.0–52.0)
Hemoglobin: 10.1 g/dL — ABNORMAL LOW (ref 13.0–17.0)
MCH: 30.1 pg (ref 26.0–34.0)
MCHC: 33.1 g/dL (ref 30.0–36.0)
MCV: 90.8 fL (ref 78.0–100.0)
Platelets: 156 10*3/uL (ref 150–400)
RBC: 3.36 MIL/uL — ABNORMAL LOW (ref 4.22–5.81)
RDW: 13.1 % (ref 11.5–15.5)
WBC: 7.6 10*3/uL (ref 4.0–10.5)

## 2016-03-30 MED ORDER — POTASSIUM CHLORIDE CRYS ER 20 MEQ PO TBCR
40.0000 meq | EXTENDED_RELEASE_TABLET | Freq: Two times a day (BID) | ORAL | Status: DC
  Administered 2016-03-30 – 2016-04-01 (×5): 40 meq via ORAL
  Filled 2016-03-30 (×5): qty 2

## 2016-03-30 MED FILL — Heparin Sodium (Porcine) Inj 1000 Unit/ML: INTRAMUSCULAR | Qty: 30 | Status: AC

## 2016-03-30 MED FILL — Potassium Chloride Inj 2 mEq/ML: INTRAVENOUS | Qty: 40 | Status: AC

## 2016-03-30 MED FILL — Magnesium Sulfate Inj 50%: INTRAMUSCULAR | Qty: 10 | Status: AC

## 2016-03-30 NOTE — Progress Notes (Signed)
Patient again with ambulation in hallway, walker and family friend. Will monitor patient. Richa Shor, [name] RN

## 2016-03-30 NOTE — Progress Notes (Signed)
Patient ambulated in hallway with family friend. Will monitor patient. [name], [name] RN

## 2016-03-30 NOTE — Progress Notes (Signed)
Inpatient Diabetes Program Recommendations  AACE/ADA: New Consensus Statement on Inpatient Glycemic Control (2015)  Target Ranges:  Prepandial:   less than 140 mg/dL      Peak postprandial:   less than 180 mg/dL (1-2 hours)      Critically ill patients:  140 - 180 mg/dL   Lab Results  Component Value Date   GLUCAP 137 (H) 03/30/2016   HGBA1C 7.2 (H) 03/23/2016    Review of Glycemic Control  Results for KEYAAN, KOPPEL (MRN [id number]) as of 03/30/2016 08:50  Ref. Range 03/29/2016 10:59 03/29/2016 16:13 03/29/2016 20:38 03/29/2016 22:20 03/30/2016 06:35  Glucose-Capillary Latest Ref Range: 65 - 99 mg/dL 195 (H) 138 (H) 182 (H) 147 (H) 137 (H)    Diabetes history: Type 2 Outpatient Diabetes medications: Glucophage 500mg  bid Current orders for Inpatient glycemic control: Glucophage 500mg  bid, Novolog 0-24 units tid/hs  Inpatient Diabetes Program Recommendations:  Agree with current orders for blood sugar management.  Gentry Fitz, RN, BA, MHA, CDE Diabetes Coordinator Inpatient Diabetes Program  [phone] (Team Pager) [phone] ([place]) 03/30/2016 8:51 AM

## 2016-03-30 NOTE — Progress Notes (Signed)
CARDIAC REHAB PHASE I   PRE:  Rate/Rhythm: 79 ? HB    BP: sitting 116/72    SaO2: 98 RA  MODE:  Ambulation: 850 ft   POST:  Rate/Rhythm: 86    BP: sitting 121/78     SaO2: 98 RA  Tolerated very well, no c/o. Independent. Pt is also walking with family. To recliner. [place], ACSM 03/30/2016 11:18 AM

## 2016-03-30 NOTE — Care Management Important Message (Signed)
Important Message  Patient Details  Name: [name] MRN: [id number] Date of Birth: March 15, 1946   Medicare Important Message Given:  Yes    [name] 03/30/2016, 11:01 AM

## 2016-03-30 NOTE — Progress Notes (Addendum)
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  He states his chest tube dressing continues to saturate.  He also states sometimes its hard to get a full breath.  + BM  Objective: Vital signs in last 24 hours: Temp:  [97.7 F (36.5 C)-98.2 F (36.8 C)] 97.7 F (36.5 C) (08/07 0331) Pulse Rate:  [68-88] 88 (08/07 0331) Cardiac Rhythm: Heart block;Bundle branch block (08/07 0258) Resp:  [19] 19 (08/07 0331) BP: (98-131)/(59-80) 131/80 (08/07 0331) SpO2:  [93 %-100 %] 98 % (08/07 0331) Weight:  [219 lb 9.6 oz (99.6 kg)] 219 lb 9.6 oz (99.6 kg) (08/07 0331)  Intake/Output from previous day: 08/06 0701 - 08/07 0700 In: 240 [P.O.:240] Out: 1685 [Urine:1675; Chest Tube:10] Intake/Output this shift: Total I/O In: -  Out: 250 [Urine:200; Chest Tube:50]  General appearance: alert, cooperative and no distress Heart: regular rate and rhythm Lungs: clear to auscultation bilaterally Abdomen: soft, non-tender; bowel sounds normal; no masses,  no organomegaly Extremities: extremities normal, atraumatic, no cyanosis or edema Wound: + sub q emphysema along right chest with extension into left chest and starting into right neck... Dressing was saturated  Lab Results:  Recent Labs  03/28/16 0259 03/30/16 0240  WBC 9.1 7.6  HGB 10.6* 10.1*  HCT 32.1* 30.5*  PLT 114* 156   BMET:  Recent Labs  03/28/16 0259 03/30/16 0240  NA 136 134*  K 4.4 3.5  CL 100* 98*  CO2 30 30  GLUCOSE 163* 124*  BUN 12 14  CREATININE 1.40* 1.51*  CALCIUM 8.6* 8.6*    PT/INR:  Recent Labs  03/30/16 0240  LABPROT 15.5*  INR 1.22   ABG    Component Value Date/Time   PHART 7.317 (L) 03/25/2016 2317   HCO3 21.1 03/25/2016 2317   TCO2 22 03/26/2016 1728   ACIDBASEDEF 5.0 (H) 03/25/2016 2317   O2SAT 95.0 03/25/2016 2317   CBG (last 3)   Recent Labs  03/29/16 2038 03/29/16 2220 03/30/16 0635  GLUCAP 182* 147* 137*    Assessment/Plan: S/P Procedure(s) (LRB): MINIMALLY 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 CV- AF, continue Amiodarone, Coreg, Coumadin.. INR 1.22, repeat dose of 5 mg today 2. Pulm- remains on room air, extensive sub q air remains present....  3. Renal- + hypokalemia, will increase potassium supplementation, weight is at baseline, on Lasix 4. Thrombocytopenia- plt count improving 5. DM- sugars controlled 6. Dispo-patient stable, sub q emphysema is biggest issue right now, chest tube management per Dr. Roxy Manns   LOS: 5 days    [name], Junie Panning 03/30/2016  I have seen and examined the patient and agree with the assessment and plan as outlined.  Mr Pharo looks better this morning with decreased subQ air and no pneumothorax on CXR.  Chest tube tip is just outside of pleural space.  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  As long as he continues to improve will leave tube where it is.  If he develops PTX  or subcutaneous emphysema gets any worse will need non-contrast CT scan of chest to identify best location for tube replacement.  No CPAP.  [name], MD 03/30/2016 9:22 AM

## 2016-03-30 NOTE — Progress Notes (Signed)
Patient ambulated with wife in hallway back in room, will monitor patient. Wynter Grave, [name] RN

## 2016-03-31 ENCOUNTER — Inpatient Hospital Stay (HOSPITAL_COMMUNITY): Payer: Medicare Other

## 2016-03-31 LAB — GLUCOSE, CAPILLARY
GLUCOSE-CAPILLARY: 156 mg/dL — AB (ref 65–99)
GLUCOSE-CAPILLARY: 126 mg/dL — AB (ref 65–99)
GLUCOSE-CAPILLARY: 131 mg/dL — AB (ref 65–99)
Glucose-Capillary: 137 mg/dL — ABNORMAL HIGH (ref 65–99)

## 2016-03-31 LAB — PROTIME-INR
INR: 1.53
PROTHROMBIN TIME: 18.5 s — AB (ref 11.4–15.2)

## 2016-03-31 LAB — BASIC METABOLIC PANEL
ANION GAP: 8 (ref 5–15)
BUN: 18 mg/dL (ref 6–20)
CALCIUM: 8.9 mg/dL (ref 8.9–10.3)
CO2: 27 mmol/L (ref 22–32)
Chloride: 99 mmol/L — ABNORMAL LOW (ref 101–111)
Creatinine, Ser: 1.4 mg/dL — ABNORMAL HIGH (ref 0.61–1.24)
GFR calc Af Amer: 57 mL/min — ABNORMAL LOW (ref >60)
GFR, EST NON AFRICAN AMERICAN: 49 mL/min — AB (ref >60)
GLUCOSE: 135 mg/dL — AB (ref 65–99)
POTASSIUM: 4.2 mmol/L (ref 3.5–5.1)
SODIUM: 134 mmol/L — AB (ref 135–145)

## 2016-03-31 NOTE — Progress Notes (Addendum)
      [address]       [place] [address]             [phone]      6 Days Post-Op Procedure(s) (LRB): MINIMALLY INVASIVE REOPERATION FOR MITRAL VALVE REPAIR  (MVR) with size 26 Sorin Memo 3D (Right) TRANSESOPHAGEAL ECHOCARDIOGRAM (TEE) (N/A)   Subjective:  [name] has no new complaints.  Continues to fatigue with ambulation.  Denies pain and shortness of breath.  Objective: Vital signs in last 24 hours: Temp:  [97.4 F (36.3 C)-98.6 F (37 C)] 98.6 F (37 C) (08/08 0616) Pulse Rate:  [75-83] 83 (08/08 0616) Cardiac Rhythm: Heart block (08/07 1904) Resp:  [20] 20 (08/08 0616) BP: (115-130)/(68-78) 129/77 (08/08 0616) SpO2:  [96 %-99 %] 99 % (08/08 0616) Weight:  [219 lb 9.6 oz (99.6 kg)] 219 lb 9.6 oz (99.6 kg) (08/08 0616)  Intake/Output from previous day: 08/07 0701 - 08/08 0700 In: -  Out: 4257 [Urine:4175; Chest Tube:82]  General appearance: alert, cooperative and no distress Heart: regular rate and rhythm Lungs: clear to auscultation bilaterally, sub q air appears unchanged on right side with mild extension into left side Abdomen: soft, non-tender; bowel sounds normal; no masses,  no organomegaly Extremities: edema trace Wound: clean and dry,  Lab Results:  Recent Labs  03/30/16 0240  WBC 7.6  HGB 10.1*  HCT 30.5*  PLT 156   BMET:  Recent Labs  03/30/16 0240 03/31/16 0318  NA 134* 134*  K 3.5 4.2  CL 98* 99*  CO2 30 27  GLUCOSE 124* 135*  BUN 14 18  CREATININE 1.51* 1.40*  CALCIUM 8.6* 8.9    PT/INR:  Recent Labs  03/31/16 0318  LABPROT 18.5*  INR 1.53   ABG    Component Value Date/Time   PHART 7.317 (L) 03/25/2016 2317   HCO3 21.1 03/25/2016 2317   TCO2 22 03/26/2016 1728   ACIDBASEDEF 5.0 (H) 03/25/2016 2317   O2SAT 95.0 03/25/2016 2317   CBG (last 3)   Recent Labs  03/30/16 1655 03/30/16 2118 03/31/16 0614  GLUCAP 175* 158* 137*    Assessment/Plan: S/P Procedure(s) (LRB): MINIMALLY INVASIVE  REOPERATION FOR MITRAL VALVE REPAIR  (MVR) with size 26 Sorin Memo 3D (Right) TRANSESOPHAGEAL ECHOCARDIOGRAM (TEE) (N/A)  1. CV- H/O A. Fib, currently NSR- continue Amiodarone, Coreg... Creatinine is elevated  no ACE for now 2. Pulm- chest tube remains in place, sub q emphysema appears unchanged 3. Renal- creatinine continues to decrease down to 1.40, Hypokalemia improved,  weight remains stable, continue Lasix for now 4. DM- sugars controlled, continue metformin 5. Dispo- patient stable, ordered CXR for this AM will review, leave chest tube in place for now   LOS: 6 days    [name], [name] 03/31/2016  I have seen and examined the patient and agree with the assessment and plan as outlined.  Subcutaneous air looks much improved on exam.  No air leak.   Will d/c chest tube.  Possible d/c home tomorrow.  No CPAP for at least 2 weeks.  If subcutaneous air gets worse after tube removal will need non-contrast CT scan of chest to guide chest tube replacement, but I am encouraged that air leak has resolved.  Creatinine slightly improved and essentially stable at pre-op baseline so continue metformin.  BP well controlled.  No ACE-I or ARB due to renal dysfunction.  [name], MD 03/31/2016 10:35 AM

## 2016-03-31 NOTE — Progress Notes (Signed)
Chest tube has beend D/C'd per doctors orders. Pt tolerated it well. Will continue to monitor.

## 2016-03-31 NOTE — Progress Notes (Signed)
[place] with pt to see if he wanted to walk. Pt stated he walked three times during the night because he could not sleep and once this morning. Sleepy now. Wife stated will walk later and pt in agreement. Will continue to follow. Graylon Good RN BSN 03/31/2016 10:35 AM

## 2016-04-01 ENCOUNTER — Inpatient Hospital Stay (HOSPITAL_COMMUNITY): Payer: Medicare Other

## 2016-04-01 LAB — GLUCOSE, CAPILLARY
Glucose-Capillary: 137 mg/dL — ABNORMAL HIGH (ref 65–99)
Glucose-Capillary: 123 mg/dL — ABNORMAL HIGH (ref 65–99)

## 2016-04-01 LAB — PROTIME-INR
INR: 1.58
Prothrombin Time: 19 seconds — ABNORMAL HIGH (ref 11.4–15.2)

## 2016-04-01 MED ORDER — WARFARIN SODIUM 5 MG PO TABS: 5.0000 mg | ORAL_TABLET | Freq: Every day | ORAL | 3 refills | Status: DC

## 2016-04-01 MED ORDER — AMIODARONE HCL 200 MG PO TABS: 200.0000 mg | ORAL_TABLET | Freq: Every day | ORAL | 1 refills | Status: DC

## 2016-04-01 MED ORDER — HYDROCODONE-ACETAMINOPHEN 10-325 MG PO TABS: 1.0000 | ORAL_TABLET | ORAL | 0 refills | Status: DC | PRN

## 2016-04-01 MED ORDER — CARVEDILOL 3.125 MG PO TABS: 3.1250 mg | ORAL_TABLET | Freq: Two times a day (BID) | ORAL | 3 refills | Status: DC

## 2016-04-01 NOTE — Progress Notes (Signed)
CARDIAC REHAB PHASE I   Pt ambulating independently, declines additional ambulation. Pt states he has no questions regarding education at this time. Phase 2 cardiac rehab referral sent to [facility] per pt request.   Lenna Sciara, RN, BSN 04/01/2016 10:33 AM

## 2016-04-01 NOTE — Progress Notes (Addendum)
      [address]       [place] [address]             [phone]      7 Days Post-Op Procedure(s) (LRB): MINIMALLY INVASIVE REOPERATION FOR MITRAL VALVE REPAIR  (MVR) with size 26 Sorin Memo 3D (Right) TRANSESOPHAGEAL ECHOCARDIOGRAM (TEE) (N/A)   Subjective:  No new complaints.  Continues to feel tired.  States he thinks his chest is not as swollen.  + ambulation  Objective: Vital signs in last 24 hours: Temp:  [97.7 F (36.5 C)-98.3 F (36.8 C)] 98.3 F (36.8 C) (08/09 0628) Pulse Rate:  [76-78] 78 (08/09 0628) Cardiac Rhythm: Heart block;Normal sinus rhythm;Bundle branch block (08/09 0712) Resp:  [18-20] 18 (08/09 0628) BP: (102-115)/(70-86) 115/76 (08/09 0628) SpO2:  [97 %-100 %] 98 % (08/09 0628) Weight:  [215 lb 8 oz (97.8 kg)] 215 lb 8 oz (97.8 kg) (08/09 0628)  Intake/Output from previous day: 08/08 0701 - 08/09 0700 In: 240 [P.O.:240] Out: 3150 [Urine:3150]  General appearance: alert, cooperative and no distress Heart: regular rate and rhythm Lungs: clear to auscultation bilaterally Abdomen: soft, non-tender; bowel sounds normal; no masses,  no organomegaly Wound: clean, sub q emphysema remains present, appears stable  Lab Results:  Recent Labs  03/30/16 0240  WBC 7.6  HGB 10.1*  HCT 30.5*  PLT 156   BMET:  Recent Labs  03/30/16 0240 03/31/16 0318  NA 134* 134*  K 3.5 4.2  CL 98* 99*  CO2 30 27  GLUCOSE 124* 135*  BUN 14 18  CREATININE 1.51* 1.40*  CALCIUM 8.6* 8.9    PT/INR:  Recent Labs  04/01/16 0345  LABPROT 19.0*  INR 1.58   ABG    Component Value Date/Time   PHART 7.317 (L) 03/25/2016 2317   HCO3 21.1 03/25/2016 2317   TCO2 22 03/26/2016 1728   ACIDBASEDEF 5.0 (H) 03/25/2016 2317   O2SAT 95.0 03/25/2016 2317   CBG (last 3)   Recent Labs  03/31/16 1600 03/31/16 2111 04/01/16 0623  GLUCAP 126* 131* 137*    Assessment/Plan: S/P Procedure(s) (LRB): MINIMALLY INVASIVE REOPERATION FOR MITRAL VALVE  REPAIR  (MVR) with size 26 Sorin Memo 3D (Right) TRANSESOPHAGEAL ECHOCARDIOGRAM (TEE) (N/A)  1. CV- H/O A. Fib, currently NSR- continue Amiodarone, Coreg- no ACE with elevated Creatinine 2. Pulm- chest tube removed yesterday, no pneumothorax appreciated, sub q air appears stable 3. INR 1.58, continue coumadin at 5 mg daily 4. Renal- creatinine trending down, continue Lasix, no ACE/ARB 5. DM- sugars controlled 6. Dispo- patient stable, sub q air appears stable... Will defer discharge decision to Dr. Roxy Manns    LOS: 7 days    [name], [name] 04/01/2016  I have seen and examined the patient and agree with the assessment and plan as outlined.  D/C home today.  Instructions given.  [name], MD 04/01/2016 9:17 AM

## 2016-04-03 ENCOUNTER — Ambulatory Visit (INDEPENDENT_AMBULATORY_CARE_PROVIDER_SITE_OTHER): Payer: Medicare Other | Admitting: Pharmacist

## 2016-04-03 DIAGNOSIS — Z9889 Other specified postprocedural states: Secondary | ICD-10-CM

## 2016-04-03 DIAGNOSIS — Z7189 Other specified counseling: Secondary | ICD-10-CM | POA: Insufficient documentation

## 2016-04-03 DIAGNOSIS — Z5181 Encounter for therapeutic drug level monitoring: Secondary | ICD-10-CM | POA: Insufficient documentation

## 2016-04-03 LAB — POCT INR: INR: 2.7

## 2016-04-10 ENCOUNTER — Encounter: Payer: Self-pay | Admitting: *Deleted

## 2016-04-10 ENCOUNTER — Other Ambulatory Visit: Payer: Self-pay | Admitting: Thoracic Surgery (Cardiothoracic Vascular Surgery)

## 2016-04-10 ENCOUNTER — Encounter: Payer: Self-pay | Admitting: Cardiology

## 2016-04-10 ENCOUNTER — Encounter (INDEPENDENT_AMBULATORY_CARE_PROVIDER_SITE_OTHER): Payer: Self-pay

## 2016-04-10 ENCOUNTER — Ambulatory Visit (INDEPENDENT_AMBULATORY_CARE_PROVIDER_SITE_OTHER): Payer: Medicare Other | Admitting: Pharmacist

## 2016-04-10 ENCOUNTER — Ambulatory Visit (INDEPENDENT_AMBULATORY_CARE_PROVIDER_SITE_OTHER): Payer: Medicare Other | Admitting: Cardiology

## 2016-04-10 VITALS — BP 106/72 | HR 81 | Ht 70.0 in | Wt 213.0 lb

## 2016-04-10 DIAGNOSIS — Z9889 Other specified postprocedural states: Secondary | ICD-10-CM

## 2016-04-10 DIAGNOSIS — R0602 Shortness of breath: Secondary | ICD-10-CM | POA: Diagnosis not present

## 2016-04-10 DIAGNOSIS — J939 Pneumothorax, unspecified: Secondary | ICD-10-CM

## 2016-04-10 DIAGNOSIS — I5032 Chronic diastolic (congestive) heart failure: Secondary | ICD-10-CM

## 2016-04-10 DIAGNOSIS — Z5181 Encounter for therapeutic drug level monitoring: Secondary | ICD-10-CM

## 2016-04-10 DIAGNOSIS — I25118 Atherosclerotic heart disease of native coronary artery with other forms of angina pectoris: Secondary | ICD-10-CM | POA: Diagnosis not present

## 2016-04-10 DIAGNOSIS — I340 Nonrheumatic mitral (valve) insufficiency: Secondary | ICD-10-CM | POA: Diagnosis not present

## 2016-04-10 LAB — CBC WITH DIFFERENTIAL/PLATELET
BASOS ABS: 130 {cells}/uL (ref 0–200)
Basophils Relative: 1 %
EOS ABS: 910 {cells}/uL — AB (ref 15–500)
EOS PCT: 7 %
HEMATOCRIT: 36.5 % — AB (ref 38.5–50.0)
HEMOGLOBIN: 12.2 g/dL — AB (ref 13.2–17.1)
Lymphocytes Relative: 11 %
Lymphs Abs: 1430 cells/uL (ref 850–3900)
MCH: 30.6 pg (ref 27.0–33.0)
MCHC: 33.4 g/dL (ref 32.0–36.0)
MCV: 91.5 fL (ref 80.0–100.0)
MONO ABS: 520 {cells}/uL (ref 200–950)
MPV: 8.3 fL (ref 7.5–12.5)
Monocytes Relative: 4 %
NEUTROS ABS: 10010 {cells}/uL — AB (ref 1500–7800)
NEUTROS PCT: 77 %
Platelets: 384 10*3/uL (ref 140–400)
RBC: 3.99 MIL/uL — ABNORMAL LOW (ref 4.20–5.80)
RDW: 14.1 % (ref 11.0–15.0)
WBC: 13 10*3/uL — ABNORMAL HIGH (ref 3.8–10.8)

## 2016-04-10 LAB — BASIC METABOLIC PANEL
BUN: 28 mg/dL — ABNORMAL HIGH (ref 7–25)
CALCIUM: 9.3 mg/dL (ref 8.6–10.3)
CHLORIDE: 100 mmol/L (ref 98–110)
CO2: 29 mmol/L (ref 20–31)
CREATININE: 1.66 mg/dL — AB (ref 0.70–1.18)
GLUCOSE: 115 mg/dL — AB (ref 65–99)
Potassium: 4.8 mmol/L (ref 3.5–5.3)
SODIUM: 137 mmol/L (ref 135–146)

## 2016-04-10 LAB — POCT INR: INR: 2.5

## 2016-04-10 NOTE — Patient Instructions (Signed)
Medication Instructions:  Your physician recommends that you continue on your current medications as directed. Please refer to the Current Medication list given to you today.   Labwork: BMET/BNP/CBCd today  Testing/Procedures: Your physician has requested that you have an echocardiogram. Echocardiography is a painless test that uses sound waves to create images of your heart. It provides your doctor with information about the size and shape of your heart and how well your heart's chambers and valves are working. This procedure takes approximately one hour. There are no restrictions for this procedure.    Follow-Up: Your physician recommends that you schedule a follow-up appointment in: abotu 6 weeks with Dr Aundra Dubin   Any Other Special Instructions Will Be Listed Below (If Applicable). You have been referred to Cardiac Rehab at [facility].     If you need a refill on your cardiac medications before your next appointment, please call your pharmacy.

## 2016-04-11 LAB — BRAIN NATRIURETIC PEPTIDE: Brain Natriuretic Peptide: 158.7 pg/mL — ABNORMAL HIGH (ref <100)

## 2016-04-11 NOTE — Progress Notes (Signed)
Patient ID: [name], male   DOB: 28-May-1946, 70 y.o.   MRN: [id number] PCP: Dr Harrington Challenger  70 yo with history of CAD s/p CABG and obesity s/p lap banding procedure presents for cardiology followup.  Given exertional dyspnea and chest tightness, he had LHC in 1/15.  This showed no change from prior.  Grafts were patent and there was severe stenosis in distal D1, not amenable to PCI.   This year, he developed increased exertional dyspnea.  Eventually had a TEE showing severe MR with prolapse of a portion of the anterior mitral valve leaflet.  Coronary angiography in 6/17 showed patent grafts and 90% stenosis of distal diagonal not amenable to intervention.  In 8/17, he had mitral valve repair.    He seems to be doing well post-op.  Some fatigue, minimal dypsnea.  He was able to walk 1.5 miles this weekend but was very tired.  Chest is mildly sore.    ECG: NSR, 1st degree AV block, LAFB  Labs (11/12): LDL 70 Labs (5/13): K 4.1, creatinine 1.1, BNP 48 Labs (12/13): K 3.4, creatinine 1.07, LDL 59, HDL 37 Labs (1/15): K 4.3, creatinine 1.2, HCT 40.3 Labs (3/15): LDL 38, HDL 81, BNP 55, K 4, creatinine 1.2 Labs (6/15): K 4.2, creatinine 1.4, LFTs normal, LDL 66, HDL 35 Labs (8/16): K 4, creatinine 1.6, BNP 64 Labs (12/16): LDL 61, HDL 37 Labs (4/17): K 4.2, creatinine 1.35, HCT 38.7, BNP 80 Labs (8/17): K 4.2, creatinine 1.4  PMH: 1. OSA: on CPAP.  2. Diabetes mellitus 3. Allergic rhinitis 4. CAD: s/p CABG 1995.  LHC (10/11) with 90% distal D1 (patent proximal D1 stent), total occlusion CFX, total occlusion RCA, no significant stenoses in LAD, SVG-PDA patent, SVG-OM patent, EF 55%.  Medical management.  LHC (5/13) with patent grafts and 90% distal D1 stenosis (no significant change from 10/11).  LHC (1/15) with patent grafts, 75% ISR in D1 stent, 90% stenosis distal D1 (small caliber). D1 not amenable to intervention.  - Coronary angiography (6/17): grafts patent, 90% stenosis distally in large  diagonal not amenable to intervention.   5. Obesity: lap-banding in 5/11.  6. Diastolic CHF  7. TIA: Carotid dopplers in 11/11 with 0-39% bilateral stenosis.  Carotid dopplers 6/17 with 1-39% RICA stenosis.  8. OA: Knees, C-spine. TKR 9/13.  9. HTN 10. Hyperlipidemia 11. Diverticulosis 12. MItral regurgitation: TEE (6/17) with EF 55-60%, severe eccentric MR, prolapse of a portion of the anterior leaflet, peak RV-RA gradient 45 mmHg.  - Mitral valve repair 8/17,   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  Lives in [place].   FH: Noncontributory.    Current Outpatient Prescriptions  Medication Sig Dispense Refill  . amiodarone (PACERONE) 200 MG tablet Take 1 tablet (200 mg total) by mouth daily. 30 tablet 1  . aspirin EC 81 MG tablet Take 81 mg by mouth daily.    . carvedilol (COREG) 3.125 MG tablet Take 1 tablet (3.125 mg total) by mouth 2 (two) times daily. 60 tablet 3  . cetirizine (ZYRTEC) 10 MG chewable tablet Chew 10 mg by mouth daily.    . fluticasone (FLONASE) 50 MCG/ACT nasal spray Place 1 spray into both nostrils daily as needed for allergies or rhinitis.    . furosemide (LASIX) 40 MG tablet Take one (1) tablet (40 mg total) by mouth every morning. Take half (1/2) tablet (20 mg total) by mouth mid-day.    Marland Kitchen HYDROcodone-acetaminophen (NORCO) 10-325 MG tablet Take 1  tablet by mouth every 4 (four) hours as needed for moderate pain. 20 tablet 0  . metFORMIN (GLUCOPHAGE) 500 MG tablet Take 500 mg by mouth 2 (two) times daily with a meal.    . Multiple Vitamins-Minerals (CENTRUM ADULTS PO) Take 1 tablet by mouth daily.    . nitroGLYCERIN (NITROSTAT) 0.4 MG SL tablet Place 1 tablet (0.4 mg total) under the tongue every 5 (five) minutes as needed for chest pain. 25 tablet 3  . potassium chloride SA (K-DUR,KLOR-CON) 20 MEQ tablet Take 20 mEq by mouth 2 (two) times daily.    . rosuvastatin (CRESTOR) 10 MG tablet Take 5 mg by mouth daily.    . tadalafil (CIALIS) 20 MG  tablet Take 20 mg by mouth daily as needed for erectile dysfunction.    Marland Kitchen testosterone cypionate (DEPOTESTOSTERONE CYPIONATE) 200 MG/ML injection Inject 100 mg into the muscle every Tuesday.     . traZODone (DESYREL) 100 MG tablet Take 100 mg by mouth at bedtime as needed for sleep.    . VENTOLIN HFA 108 (90 Base) MCG/ACT inhaler Inhale 1 puff into the lungs every 4 (four) hours as needed for wheezing or shortness of breath.     . warfarin (COUMADIN) 5 MG tablet Take 1 tablet (5 mg total) by mouth daily at 6 PM. 30 tablet 3   No current facility-administered medications for this visit.     BP 106/72   Pulse 81   Ht 5\' 10"  (1.778 m)   Wt 213 lb (96.6 kg)   BMI 30.56 kg/m  General: NAD Neck: Thick, JVP 7 cm, no thyromegaly or thyroid nodule.  Lungs: Clear to auscultation bilaterally with normal respiratory effort. CV: Nondisplaced PMI.  Heart regular S1/S2, no S3/S4, no murmur.  No edema.  No carotid bruit.  Normal pedal pulses.  Abdomen: Soft, nontender, no hepatosplenomegaly, no distention.  Neurologic: Alert and oriented x 3.  Psych: Normal affect. Extremities: No clubbing or cyanosis.   Assessment/Plan: 1. S/p mitral valve repair: Doing well so far. No evidence for active CHF at this time.  - Needs post-op echo, will arrange.  - No atrial fibrillation, can stop amiodarone at 1 month post-procedure.  - Can stop warfarin after 3 months.  Will then continue ASA 81.   - Check CBC today.  - Start cardiac rehab.  2. CAD: Most recent cardiac cath showed stable coronary anatomy.  He has severe stenosis in a distal D1 that is not amenable to intervention.  - Continue ASA 81, statin, Coreg.   3. Hyperlipidemia: Good lipids 12/16.    4. Chronic diastolic CHF: Continue current Lasix.  Check BMET/BNP today.  5. OSA: Continue CPAP.   [name] 71/19/2017

## 2016-04-13 ENCOUNTER — Encounter: Payer: Self-pay | Admitting: Thoracic Surgery (Cardiothoracic Vascular Surgery)

## 2016-04-13 ENCOUNTER — Ambulatory Visit (INDEPENDENT_AMBULATORY_CARE_PROVIDER_SITE_OTHER): Payer: Self-pay | Admitting: Thoracic Surgery (Cardiothoracic Vascular Surgery)

## 2016-04-13 ENCOUNTER — Ambulatory Visit
Admission: RE | Admit: 2016-04-13 | Discharge: 2016-04-13 | Disposition: A | Discharge status: Home / Self Care | Payer: Medicare Other | Source: Ambulatory Visit | Attending: Thoracic Surgery (Cardiothoracic Vascular Surgery) | Admitting: Thoracic Surgery (Cardiothoracic Vascular Surgery)

## 2016-04-13 VITALS — BP 110/77 | HR 79 | Resp 20 | Ht 70.0 in | Wt 213.0 lb

## 2016-04-13 DIAGNOSIS — Z9889 Other specified postprocedural states: Secondary | ICD-10-CM

## 2016-04-13 DIAGNOSIS — I340 Nonrheumatic mitral (valve) insufficiency: Secondary | ICD-10-CM

## 2016-04-13 DIAGNOSIS — J939 Pneumothorax, unspecified: Secondary | ICD-10-CM | POA: Diagnosis not present

## 2016-04-13 MED ORDER — HYDROCODONE-ACETAMINOPHEN 10-325 MG PO TABS: 1.0000 | ORAL_TABLET | ORAL | 0 refills | Status: DC | PRN

## 2016-04-13 NOTE — Patient Instructions (Addendum)
Stop taking amiodarone when your current prescription runs out.  Notify the coumadin clinic when you stop taking amiodarone.  Continue all other medications without any changes at this time  You may continue to gradually increase your physical activity as tolerated.  Refrain from any heavy lifting or strenuous use of your arms and shoulders until at least 8 weeks from the time of your surgery, and avoid activities that cause increased pain in your chest on the side of your surgical incision.  Otherwise you may continue to increase activities without any particular limitations.  Increase the intensity and duration of physical activity gradually.  You are encouraged to enroll and participate in the outpatient cardiac rehab program beginning as soon as practical.  You may return to driving an automobile as long as you are no longer requiring oral narcotic pain relievers during the daytime.  It would be wise to start driving only short distances during the daylight and gradually increase from there as you feel comfortable.

## 2016-04-13 NOTE — Progress Notes (Signed)
[address]       [place] [address]             [phone]     CARDIOTHORACIC SURGERY OFFICE NOTE  Referring Provider is [name], MD PCP is Gennette Pac, MD   HPI:  Patient returns to the office today for routine follow-up status post minimally invasive mitral valve repair on 03/25/2016 for severe symptomatic primary mitral regurgitation. His early postoperative recovery was notable for the development of recurrent pneumothorax after chest tube removal required chest tube replacement.  He otherwise recovered uneventfully and was discharged home on the seventh postoperative day. Since hospital discharge he has continued to do very well area and he was seen in follow-up last week by Dr. Aundra Dubin.  His INR was therapeutic at 2.5.  The patient states that he has done very well since hospital discharge. His activity level has increased fairly rapidly. He is walking every day at least one to 2 miles. He is asking about going swimming. He has very mild residual soreness in his chest. He has some mild soreness in his right thigh that seems to be related to his femoral cannulation site. He has no shortness of breath. He states that overall he feels like his energy level is already better than it was prior to surgery. His blood sugars have been running a little bit on the high side but he admits that he has not been very good about sticking to a diabetic diet.   Current Outpatient Prescriptions  Medication Sig Dispense Refill  . amiodarone (PACERONE) 200 MG tablet Take 1 tablet (200 mg total) by mouth daily. 30 tablet 1  . aspirin EC 81 MG tablet Take 81 mg by mouth daily.    . carvedilol (COREG) 3.125 MG tablet Take 1 tablet (3.125 mg total) by mouth 2 (two) times daily. 60 tablet 3  . cetirizine (ZYRTEC) 10 MG chewable tablet Chew 10 mg by mouth daily.    . fluticasone (FLONASE) 50 MCG/ACT nasal spray Place 1 spray into both nostrils daily as needed for  allergies or rhinitis.    . furosemide (LASIX) 40 MG tablet Take one (1) tablet (40 mg total) by mouth every morning. Take half (1/2) tablet (20 mg total) by mouth mid-day.    Marland Kitchen HYDROcodone-acetaminophen (NORCO) 10-325 MG tablet Take 1 tablet by mouth every 4 (four) hours as needed for moderate pain. 20 tablet 0  . metFORMIN (GLUCOPHAGE) 500 MG tablet Take 500 mg by mouth 2 (two) times daily with a meal.    . Multiple Vitamins-Minerals (CENTRUM ADULTS PO) Take 1 tablet by mouth daily.    . nitroGLYCERIN (NITROSTAT) 0.4 MG SL tablet Place 1 tablet (0.4 mg total) under the tongue every 5 (five) minutes as needed for chest pain. 25 tablet 3  . potassium chloride SA (K-DUR,KLOR-CON) 20 MEQ tablet Take 20 mEq by mouth 2 (two) times daily.    . rosuvastatin (CRESTOR) 10 MG tablet Take 5 mg by mouth daily.    . tadalafil (CIALIS) 20 MG tablet Take 20 mg by mouth daily as needed for erectile dysfunction.    Marland Kitchen testosterone cypionate (DEPOTESTOSTERONE CYPIONATE) 200 MG/ML injection Inject 100 mg into the muscle every Tuesday.     . traZODone (DESYREL) 100 MG tablet Take 100 mg by mouth at bedtime as needed for sleep.    . VENTOLIN HFA 108 (90 Base) MCG/ACT inhaler Inhale 1 puff into the lungs every 4 (four) hours  as needed for wheezing or shortness of breath.     . warfarin (COUMADIN) 5 MG tablet Take 1 tablet (5 mg total) by mouth daily at 6 PM. 30 tablet 3   No current facility-administered medications for this visit.       Physical Exam:   BP 110/77 (BP Location: Right Arm, Patient Position: Sitting, Cuff Size: Small)   Pulse 79   Resp 20   Ht 5\' 10"  (1.778 m)   Wt 213 lb (96.6 kg)   SpO2 93%   BMI 30.56 kg/m   General:  Well appearing  Chest:   Clear to auscultation  CV:   Regular rate and rhythm without murmur  Incisions:  Healing nicely, chest tube sutures are removed  Abdomen:  Soft nontender  Extremities:  Warm and well-perfused  Diagnostic Tests:  CHEST  2 VIEW  COMPARISON:   04/01/2016  FINDINGS: Post CABG changes are again seen.  The cardiac silhouette is mildly enlarged. Mediastinal contours appear intact.  There is no evidence of focal airspace consolidation, pleural effusion or pneumothorax. There is minimal residual right chest wall emphysema.  Osseous structures are without acute abnormality.  IMPRESSION: Minimal residual right chest wall emphysema. No evidence of pneumothorax.   Electronically Signed   By: Fidela Salisbury M.D.   On: 04/13/2016 09:24   Impression:  Patient is doing very well approximately 3 weeks status post minimally invasive mitral valve repair.  Plan:  I have encouraged the patient to continue to gradually increase his physical activity as tolerated with his primary limitation that he remain from heavy lifting or strenuous use of his arms or shoulders for at least another 4-6 weeks. I think he may resume driving an automobile. I have encouraged him to enroll and participate in the outpatient cardiac rehabilitation program. We have not recommended any changes to his current medications. However, I have instructed the patient to stop taking amiodarone at the end of this month when his current prescription runs out. I have also advised the patient to notify the Coumadin clinic when he stops taking amiodarone. The patient will return to our office in approximately 2 months for routine follow-up.    Valentina Gu. Roxy Manns, MD 04/13/2016 9:32 AM

## 2016-04-17 ENCOUNTER — Ambulatory Visit (INDEPENDENT_AMBULATORY_CARE_PROVIDER_SITE_OTHER): Payer: Medicare Other | Admitting: *Deleted

## 2016-04-17 ENCOUNTER — Other Ambulatory Visit: Payer: Self-pay

## 2016-04-17 ENCOUNTER — Ambulatory Visit (HOSPITAL_COMMUNITY): Payer: Medicare Other | Attending: Cardiology

## 2016-04-17 DIAGNOSIS — R0602 Shortness of breath: Secondary | ICD-10-CM | POA: Diagnosis not present

## 2016-04-17 DIAGNOSIS — G4733 Obstructive sleep apnea (adult) (pediatric): Secondary | ICD-10-CM | POA: Diagnosis not present

## 2016-04-17 DIAGNOSIS — E785 Hyperlipidemia, unspecified: Secondary | ICD-10-CM | POA: Diagnosis not present

## 2016-04-17 DIAGNOSIS — Z9889 Other specified postprocedural states: Secondary | ICD-10-CM | POA: Insufficient documentation

## 2016-04-17 DIAGNOSIS — I119 Hypertensive heart disease without heart failure: Secondary | ICD-10-CM | POA: Diagnosis not present

## 2016-04-17 DIAGNOSIS — E119 Type 2 diabetes mellitus without complications: Secondary | ICD-10-CM | POA: Insufficient documentation

## 2016-04-17 DIAGNOSIS — I77810 Thoracic aortic ectasia: Secondary | ICD-10-CM | POA: Insufficient documentation

## 2016-04-17 DIAGNOSIS — Z951 Presence of aortocoronary bypass graft: Secondary | ICD-10-CM | POA: Diagnosis not present

## 2016-04-17 DIAGNOSIS — Z8673 Personal history of transient ischemic attack (TIA), and cerebral infarction without residual deficits: Secondary | ICD-10-CM | POA: Diagnosis not present

## 2016-04-17 DIAGNOSIS — I059 Rheumatic mitral valve disease, unspecified: Secondary | ICD-10-CM | POA: Diagnosis present

## 2016-04-17 DIAGNOSIS — Z5181 Encounter for therapeutic drug level monitoring: Secondary | ICD-10-CM

## 2016-04-17 DIAGNOSIS — I2510 Atherosclerotic heart disease of native coronary artery without angina pectoris: Secondary | ICD-10-CM | POA: Insufficient documentation

## 2016-04-17 DIAGNOSIS — I340 Nonrheumatic mitral (valve) insufficiency: Secondary | ICD-10-CM | POA: Diagnosis not present

## 2016-04-17 LAB — POCT INR: INR: 2.7

## 2016-04-24 ENCOUNTER — Ambulatory Visit (INDEPENDENT_AMBULATORY_CARE_PROVIDER_SITE_OTHER): Payer: Medicare Other | Admitting: Pharmacist

## 2016-04-24 DIAGNOSIS — Z5181 Encounter for therapeutic drug level monitoring: Secondary | ICD-10-CM

## 2016-04-24 DIAGNOSIS — Z9889 Other specified postprocedural states: Secondary | ICD-10-CM | POA: Diagnosis not present

## 2016-04-24 LAB — POCT INR: INR: 2.7

## 2016-04-29 DIAGNOSIS — C801 Malignant (primary) neoplasm, unspecified: Secondary | ICD-10-CM | POA: Diagnosis present

## 2016-05-07 ENCOUNTER — Encounter (HOSPITAL_COMMUNITY): Payer: Self-pay

## 2016-05-07 ENCOUNTER — Ambulatory Visit (INDEPENDENT_AMBULATORY_CARE_PROVIDER_SITE_OTHER): Payer: Medicare Other | Admitting: Pharmacist

## 2016-05-07 ENCOUNTER — Encounter (HOSPITAL_COMMUNITY)
Admission: RE | Admit: 2016-05-07 | Discharge: 2016-05-07 | Disposition: A | Discharge status: Home / Self Care | Payer: Medicare Other | Source: Ambulatory Visit | Attending: Cardiology | Admitting: Cardiology

## 2016-05-07 VITALS — BP 120/76 | HR 80 | Ht 67.75 in | Wt 216.3 lb

## 2016-05-07 DIAGNOSIS — Z5181 Encounter for therapeutic drug level monitoring: Secondary | ICD-10-CM | POA: Diagnosis not present

## 2016-05-07 DIAGNOSIS — Z9889 Other specified postprocedural states: Secondary | ICD-10-CM

## 2016-05-07 LAB — POCT INR: INR: 2.4

## 2016-05-07 NOTE — Progress Notes (Signed)
Cardiac Individual Treatment Plan  Patient Details  Name: [name] MRN: [id number] Date of Birth: 12-23-1945 Referring Provider:   Flowsheet Row CARDIAC REHAB PHASE II ORIENTATION from 05/07/2016 in [place]  Referring Provider  Aundra Dubin, [place],MD      Initial Encounter Date:  [place] PHASE II ORIENTATION from 05/07/2016 in [place]  Date  05/07/16  Referring Provider  Aundra Dubin, [place],MD      Visit Diagnosis: 03/25/16 S/P MVR (mitral valve repair)  Patient's Home Medications on Admission:  Current Outpatient Prescriptions:  .  aspirin EC 81 MG tablet, Take 81 mg by mouth daily., Disp: , Rfl:  .  carvedilol (COREG) 3.125 MG tablet, Take 1 tablet (3.125 mg total) by mouth 2 (two) times daily., Disp: 60 tablet, Rfl: 3 .  cetirizine (ZYRTEC) 10 MG chewable tablet, Chew 10 mg by mouth daily., Disp: , Rfl:  .  fluticasone (FLONASE) 50 MCG/ACT nasal spray, Place 1 spray into both nostrils daily as needed for allergies or rhinitis., Disp: , Rfl:  .  furosemide (LASIX) 40 MG tablet, Take one (1) tablet (40 mg total) by mouth every morning. Take half (1/2) tablet (20 mg total) by mouth mid-day., Disp: , Rfl:  .  HYDROcodone-acetaminophen (NORCO) 10-325 MG tablet, Take 1 tablet by mouth every 4 (four) hours as needed for moderate pain., Disp: 20 tablet, Rfl: 0 .  metFORMIN (GLUCOPHAGE) 500 MG tablet, Take 500 mg by mouth 2 (two) times daily with a meal., Disp: , Rfl:  .  nitroGLYCERIN (NITROSTAT) 0.4 MG SL tablet, Place 1 tablet (0.4 mg total) under the tongue every 5 (five) minutes as needed for chest pain., Disp: 25 tablet, Rfl: 3 .  potassium chloride SA (K-DUR,KLOR-CON) 20 MEQ tablet, Take 20 mEq by mouth 2 (two) times daily., Disp: , Rfl:  .  rosuvastatin (CRESTOR) 10 MG tablet, Take 5 mg by mouth daily., Disp: , Rfl:  .  tadalafil (CIALIS) 20 MG tablet, Take 20 mg by mouth daily as needed for erectile  dysfunction., Disp: , Rfl:  .  traZODone (DESYREL) 100 MG tablet, Take 100 mg by mouth at bedtime as needed for sleep., Disp: , Rfl:  .  VENTOLIN HFA 108 (90 Base) MCG/ACT inhaler, Inhale 1 puff into the lungs every 4 (four) hours as needed for wheezing or shortness of breath. , Disp: , Rfl:  .  warfarin (COUMADIN) 5 MG tablet, Take 1 tablet (5 mg total) by mouth daily at 6 PM. (Patient taking differently: Take 5 mg by mouth daily at 6 PM. Pt takes 1.5 tabs (7.5mg  total) MWF, 1 tab (5mg  total) all other days), Disp: 30 tablet, Rfl: 3  Past Medical History: Past Medical History:  Diagnosis Date  . Allergic rhinitis   . Anxiety   . Cancer ([place])    hx of skin cancers   . Coronary artery disease    a. s/p MI & CABG; b. s/p PCI Diag;  c. Cath 12/2011: LAD diff dzs/small, Diag 75% isr, 90 dist to stent (small), LCX & RCA occluded, VG->OM 40, VG->PDA patent - med rx.  . Depression   . Diabetes mellitus    ORAL MEDS  . Diastolic CHF, chronic (HCC)    a. EF 55-60% by echo 2007  . Dysrhythmia    "abnormal beat"  . GERD (gastroesophageal reflux disease)    "not anymore" " I had a lap band"  . History of diverticulitis of colon  5 YRS AGO  . History of pneumonia    2017  . History of transient ischemic attack (TIA)    CAROTID DOPPLERS NOV 2011  0-39& BIL. STENOSIS  . Hyperlipidemia   . Hypertension   . Mitral regurgitation   . Myocardial infarction ([place])   . Neuromuscular disorder ([place])    left arm numbness   . 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 PTSD (post-traumatic stress disorder)    from [place]  . 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 Shortness of breath dyspnea    with exertion  . Sleep apnea    cpap- see ov [facility] in [facility] 05/12/11  for settings     Tobacco Use: History  Smoking Status  . Never Smoker  Smokeless Tobacco  . Never Used    Labs: Recent Review Flowsheet Data    Labs for ITP Cardiac and Pulmonary Rehab Latest Ref Rng & Units 03/25/2016 03/25/2016 03/25/2016 03/25/2016 03/26/2016   Cholestrol 125 - 200 mg/dL - - - - -   LDLCALC <130 mg/dL - - - - -   HDL >=40 mg/dL - - - - -   Trlycerides <150 mg/dL - - - - -   Hemoglobin A1c 4.8 - 5.6 % - - - - -   PHART 7.350 - 7.450 7.314(L) 7.300(L) 7.317(L) - -   PCO2ART 35.0 - 45.0 mmHg 42.8 40.5 41.5 - -   HCO3 20.0 - 24.0 mEq/L 22.0 20.0 21.1 - -   TCO2 0 - 100 mmol/L 23 21 22 23 22    ACIDBASEDEF 0.0 - 2.0 mmol/L 4.0(H) 6.0(H) 5.0(H) - -   O2SAT % 94.0 98.0 95.0 - -      Capillary Blood Glucose: Lab Results  Component Value Date   GLUCAP 123 (H) 04/01/2016   GLUCAP 137 (H) 04/01/2016   GLUCAP 131 (H) 03/31/2016   GLUCAP 126 (H) 03/31/2016   GLUCAP 156 (H) 03/31/2016     Exercise Target Goals: Date: 05/07/16  Exercise Program Goal: Individual exercise prescription set with THRR, safety & activity barriers. Participant demonstrates ability to understand and report RPE using BORG scale, to self-measure pulse accurately, and to acknowledge the importance of the exercise prescription.  Exercise Prescription Goal: Starting with aerobic activity 30 plus minutes a day, 3 days per week for initial exercise prescription. Provide home exercise prescription and guidelines that participant acknowledges understanding prior to discharge.  Activity Barriers & Risk Stratification:     Activity Barriers & Cardiac Risk Stratification - 05/07/16 0914      Activity Barriers & Cardiac Risk Stratification   Activity Barriers Right Hip Replacement;Other (comment)   Comments L ankle pain   Cardiac Risk Stratification High      6 Minute Walk:     6 Minute Walk    Row Name 05/07/16 1533         6 Minute Walk   Phase Initial     Distance 1653 feet     Walk  Time 6 minutes     # of Rest Breaks 0     MPH 3.13     METS 3.1     RPE 11     VO2 Peak 10.69     Symptoms No     Resting HR  80 bpm     Resting BP 120/76     Max Ex. HR 85 bpm     Max Ex. BP 132/76     2 Minute Post BP 108/64        Initial Exercise Prescription:     Initial Exercise Prescription - 05/07/16 1500      Date of Initial Exercise RX and Referring Provider   Date 05/07/16   Referring Provider Aundra Dubin, [place],MD     Treadmill   MPH 2.6   Grade 0   Minutes 10   METs 2.99     NuStep   Level 3   Minutes 10   METs 2     Cybex   Level 2   Minutes 10   METs 2     Prescription Details   Frequency (times per week) 3   Duration Progress to 30 minutes of continuous aerobic without signs/symptoms of physical distress     Intensity   THRR 40-80% of Max Heartrate 60-120   Ratings of Perceived Exertion 11-13   Perceived Dyspnea 0-4     Progression   Progression Continue to progress workloads to maintain intensity without signs/symptoms of physical distress.     Resistance Training   Training Prescription Yes   Weight 2lbs   Reps 10-12      Perform Capillary Blood Glucose checks as needed.  Exercise Prescription Changes:   Exercise Comments:   Discharge Exercise Prescription (Final Exercise Prescription Changes):   Nutrition:  Target Goals: Understanding of nutrition guidelines, daily intake of sodium 1500mg , cholesterol 200mg , calories 30% from fat and 7% or less from saturated fats, daily to have 5 or more servings of fruits and vegetables.  Biometrics:     Pre Biometrics - 05/07/16 1543      Pre Biometrics   Waist Circumference 45 inches   Hip Circumference 41 inches   Waist to Hip Ratio 1.1 %   Triceps Skinfold 9 mm   % Body Fat 30 %   Grip Strength 44 kg   Flexibility 10 in   Single Leg Stand 8.25 seconds       Nutrition Therapy Plan and Nutrition Goals:   Nutrition Discharge: Nutrition Scores:   Nutrition Goals  Re-Evaluation:   Psychosocial: Target Goals: Acknowledge presence or absence of depression, maximize coping skills, provide positive support system. Participant is able to verbalize types and ability to use techniques and skills needed for reducing stress and depression.  Initial Review & Psychosocial Screening:     Initial Psych Review & Screening - 05/07/16 1628      Family Dynamics   Good Support System? Yes   Comments Pt wife accompanied him for his orientation appt with cardiac rehab.     Barriers   Psychosocial barriers to participate in program There are no identifiable barriers or psychosocial needs.      Quality of Life Scores:     Quality of Life - 05/07/16 1545      Quality of Life Scores   Health/Function Pre 22 %   Socioeconomic Pre 29 %   Psych/Spiritual Pre 30 %   Family Pre 27.6 %   GLOBAL Pre 25.82 %      PHQ-9: Recent Review Flowsheet Data    Depression screen [facility] 2/9 02/14/2014   Decreased Interest 0   Down, Depressed, Hopeless 0   PHQ - 2 Score 0      Psychosocial Evaluation and Intervention:   Psychosocial Re-Evaluation:   Vocational Rehabilitation:  Provide vocational rehab assistance to qualifying candidates.   Vocational Rehab Evaluation & Intervention:     Vocational Rehab - 05/07/16 1629      Initial Vocational Rehab Evaluation & Intervention   Assessment shows need for Vocational Rehabilitation No     Discharge Vocational Rehab   Discharge Vocational Rehabilitation Pt does not plan to return to competive employment.      Education: Education Goals: Education classes will be provided on a weekly basis, covering required topics. Participant will state understanding/return demonstration of topics presented.  Learning Barriers/Preferences:     Learning Barriers/Preferences - 05/07/16 0915      Learning Barriers/Preferences   Learning Barriers Sight   Learning Preferences Skilled Demonstration;Video;Written Material;Verbal  Instruction;Individual Instruction;Pictoral;Group Instruction;Computer/Internet;Audio      Education Topics: Count Your Pulse:  -Group instruction provided by verbal instruction, demonstration, patient participation and written materials to support subject.  Instructors address importance of being able to find your pulse and how to count your pulse when at home without a heart monitor.  Patients get hands on experience counting their pulse with staff help and individually.   Heart Attack, Angina, and Risk Factor Modification:  -Group instruction provided by verbal instruction, video, and written materials to support subject.  Instructors address signs and symptoms of angina and heart attacks.    Also discuss risk factors for heart disease and how to make changes to improve heart health risk factors.   Functional Fitness:  -Group instruction provided by verbal instruction, demonstration, patient participation, and written materials to support subject.  Instructors address safety measures for doing things around the house.  Discuss how to get up and down off the floor, how to pick things up properly, how to safely get out of a chair without assistance, and balance training.   Meditation and Mindfulness:  -Group instruction provided by verbal instruction, patient participation, and written materials to support subject.  Instructor addresses importance of mindfulness and meditation practice to help reduce stress and improve awareness.  Instructor also leads participants through a meditation exercise.    Stretching for Flexibility and Mobility:  -Group instruction provided by verbal instruction, patient participation, and written materials to support subject.  Instructors lead participants through series of stretches that are designed to increase flexibility thus improving mobility.  These stretches are additional exercise for major muscle groups that are typically performed during regular warm up  and cool down.   Hands Only CPR Anytime:  -Group instruction provided by verbal instruction, video, patient participation and written materials to support subject.  Instructors co-teach with AHA video for hands only CPR.  Participants get hands on experience with mannequins.   Nutrition I class: Heart Healthy Eating:  -Group instruction provided by PowerPoint slides, verbal discussion, and written materials to support subject matter. The instructor gives an explanation and review of the Therapeutic Lifestyle Changes diet recommendations, which includes a discussion on lipid goals, dietary fat, sodium, fiber, plant stanol/sterol esters, sugar, and the components of a well-balanced, healthy diet.   Nutrition II class: Lifestyle Skills:  -Group instruction provided by PowerPoint slides, verbal discussion, and written materials to support subject matter. The instructor gives an explanation and review of label reading, grocery shopping for heart health, heart healthy recipe modifications, and ways to make healthier choices when eating out.   Diabetes Question & Answer:  -Group instruction provided by PowerPoint slides, verbal discussion, and written materials to support subject matter. The instructor gives an explanation and review of diabetes co-morbidities, pre- and  post-prandial blood glucose goals, pre-exercise blood glucose goals, signs, symptoms, and treatment of hypoglycemia and hyperglycemia, and foot care basics.   Diabetes Blitz:  -Group instruction provided by PowerPoint slides, verbal discussion, and written materials to support subject matter. The instructor gives an explanation and review of the physiology behind type 1 and type 2 diabetes, diabetes medications and rational behind using different medications, pre- and post-prandial blood glucose recommendations and Hemoglobin A1c goals, diabetes diet, and exercise including blood glucose guidelines for exercising safely.    Portion  Distortion:  -Group instruction provided by PowerPoint slides, verbal discussion, written materials, and food models to support subject matter. The instructor gives an explanation of serving size versus portion size, changes in portions sizes over the last 20 years, and what consists of a serving from each food group.   Stress Management:  -Group instruction provided by verbal instruction, video, and written materials to support subject matter.  Instructors review role of stress in heart disease and how to cope with stress positively.     Exercising on Your Own:  -Group instruction provided by verbal instruction, power point, and written materials to support subject.  Instructors discuss benefits of exercise, components of exercise, frequency and intensity of exercise, and end points for exercise.  Also discuss use of nitroglycerin and activating EMS.  Review options of places to exercise outside of rehab.  Review guidelines for sex with heart disease.   Cardiac Drugs I:  -Group instruction provided by verbal instruction and written materials to support subject.  Instructor reviews cardiac drug classes: antiplatelets, anticoagulants, beta blockers, and statins.  Instructor discusses reasons, side effects, and lifestyle considerations for each drug class.   Cardiac Drugs II:  -Group instruction provided by verbal instruction and written materials to support subject.  Instructor reviews cardiac drug classes: angiotensin converting enzyme inhibitors (ACE-I), angiotensin II receptor blockers (ARBs), nitrates, and calcium channel blockers.  Instructor discusses reasons, side effects, and lifestyle considerations for each drug class.   Anatomy and Physiology of the Circulatory System:  -Group instruction provided by verbal instruction, video, and written materials to support subject.  Reviews functional anatomy of heart, how it relates to various diagnoses, and what role the heart plays in the overall  system.   Knowledge Questionnaire Score:     Knowledge Questionnaire Score - 05/07/16 1532      Knowledge Questionnaire Score   Pre Score 18/24      Core Components/Risk Factors/Patient Goals at Admission:     Personal Goals and Risk Factors at Admission - 05/07/16 0915      Core Components/Risk Factors/Patient Goals on Admission    Weight Management Yes;Weight Loss   Intervention Weight Management: Develop a combined nutrition and exercise program designed to reach desired caloric intake, while maintaining appropriate intake of nutrient and fiber, sodium and fats, and appropriate energy expenditure required for the weight goal.;Weight Management: Provide education and appropriate resources to help participant work on and attain dietary goals.;Weight Management/Obesity: Establish reasonable short term and long term weight goals.;Obesity: Provide education and appropriate resources to help participant work on and attain dietary goals.   Expected Outcomes Long Term: Adherence to nutrition and physical activity/exercise program aimed toward attainment of established weight goal;Short Term: Continue to assess and modify interventions until short term weight is achieved;Weight Maintenance: Understanding of the daily nutrition guidelines, which includes 25-35% calories from fat, 7% or less cal from saturated fats, less than 200mg  cholesterol, less than 1.5gm of sodium, & 5 or more servings  of fruits and vegetables daily;Weight Loss: Understanding of general recommendations for a balanced deficit meal plan, which promotes 1-2 lb weight loss per week and includes a negative energy balance of [phone] kcal/d;Understanding recommendations for meals to include 15-35% energy as protein, 25-35% energy from fat, 35-60% energy from carbohydrates, less than 200mg  of dietary cholesterol, 20-35 gm of total fiber daily;Understanding of distribution of calorie intake throughout the day with the consumption of 4-5  meals/snacks   Sedentary Yes   Intervention Provide advice, education, support and counseling about physical activity/exercise needs.;Develop an individualized exercise prescription for aerobic and resistive training based on initial evaluation findings, risk stratification, comorbidities and participant's personal goals.   Expected Outcomes Achievement of increased cardiorespiratory fitness and enhanced flexibility, muscular endurance and strength shown through measurements of functional capacity and personal statement of participant.   Increase Strength and Stamina Yes   Intervention Provide advice, education, support and counseling about physical activity/exercise needs.;Develop an individualized exercise prescription for aerobic and resistive training based on initial evaluation findings, risk stratification, comorbidities and participant's personal goals.   Expected Outcomes Achievement of increased cardiorespiratory fitness and enhanced flexibility, muscular endurance and strength shown through measurements of functional capacity and personal statement of participant.   Diabetes Yes   Intervention Provide education about signs/symptoms and action to take for hypo/hyperglycemia.;Provide education about proper nutrition, including hydration, and aerobic/resistive exercise prescription along with prescribed medications to achieve blood glucose in normal ranges: Fasting glucose 65-99 mg/dL   Expected Outcomes Short Term: Participant verbalizes understanding of the signs/symptoms and immediate care of hyper/hypoglycemia, proper foot care and importance of medication, aerobic/resistive exercise and nutrition plan for blood glucose control.;Long Term: Attainment of HbA1C < 7%.   Personal Goal Other Yes   Personal Goal short: develop an exercise routine   long: gain energy and stamina and get back on track with health and learn limitations   Intervention Provide exercise programming to improve  cardiovascular endurance and provide education on cardiac awareness and risk factors   Expected Outcomes Pt will be more aware of cardiac condition, improve strength and stamina and be complaint with exercise routine      Core Components/Risk Factors/Patient Goals Review:    Core Components/Risk Factors/Patient Goals at Discharge (Final Review):    ITP Comments:     ITP Comments    Row Name 05/07/16 0912           ITP Comments Dr. Fransico Him, Medical Director          Comments:  Pt in today for cardiac rehab orientation from 0800-1000.  As part of the cardiac rehab orientation, pt completed 6 minute walk test.  Pt tolerated well with no complaints.  Monitor showed SR with first degree block. This is present on most recent 12 lead ekg.  Pt is looking forward to participating in cardiac rehab again.  Pt wife accompanied him for the appt. Cherre Huger, BSN

## 2016-05-07 NOTE — Progress Notes (Signed)
Cardiac Rehab Medication Review by a Pharmacist  Does the patient  feel that his/her medications are working for him/her?  yes  Has the patient been experiencing any side effects to the medications prescribed?  yes  Does the patient measure his/her own blood pressure or blood glucose at home?  no   Does the patient have any problems obtaining medications due to transportation or finances?   no  Understanding of regimen: good Understanding of indications: good Potential of compliance: good    Pharmacist comments: Pt presents for cardiac rehab appointment. Pt reports mild muscle weakness, but thinks it is likely due to not being able to work out. Pt had question regarding whether he could begin taking Cialis again, counseled pt to follow-up with cardiologist. Counseled pt to begin monitoring blood pressure and blood sugar regularly. No other issues reported and med list has been updated (warfarin dose updated, amiodarone discontinued).   Arrie Senate, PharmD PGY-1 Pharmacy Resident Pager: [phone]

## 2016-05-11 ENCOUNTER — Encounter (HOSPITAL_COMMUNITY)
Admission: RE | Admit: 2016-05-11 | Discharge: 2016-05-11 | Disposition: A | Discharge status: Home / Self Care | Payer: Medicare Other | Source: Ambulatory Visit | Attending: Cardiology | Admitting: Cardiology

## 2016-05-11 DIAGNOSIS — Z9889 Other specified postprocedural states: Secondary | ICD-10-CM

## 2016-05-11 LAB — GLUCOSE, CAPILLARY
GLUCOSE-CAPILLARY: 176 mg/dL — AB (ref 65–99)
Glucose-Capillary: 148 mg/dL — ABNORMAL HIGH (ref 65–99)

## 2016-05-11 NOTE — Progress Notes (Signed)
Daily Session Note  Patient Details  Name: BRYSEN SHANKMAN MRN: [id number] Date of Birth: 01-10-1946 Referring Provider:   Flowsheet Row CARDIAC REHAB PHASE II ORIENTATION from 05/07/2016 in [place]  Referring Provider  Aundra Dubin, [place]      Encounter Date: 05/11/2016  Check In:   Capillary Blood Glucose: No results found for this or any previous visit (from the past 24 hour(s)).   Goals Met:  Exercise tolerated well  Goals Unmet:  Not Applicable  Comments: Pt started cardiac rehab today.  Pt tolerated light exercise without difficulty. VSS, telemetry-Sinus with a first degree heart block, asymptomatic.  Medication list reconciled. Pt denies barriers to medicaiton compliance.  PSYCHOSOCIAL ASSESSMENT:  PHQ-0. Pt exhibits positive coping skills, hopeful outlook with supportive family. No psychosocial needs identified at this time, no psychosocial interventions necessary.    [place].   Pt oriented to exercise equipment and routine.    Understanding verbalized.Barnet Pall, RN,BSN 05/11/2016 5:33 PM   Dr. Fransico Him is Medical Director for Cardiac Rehab at [facility].

## 2016-05-13 ENCOUNTER — Encounter (HOSPITAL_COMMUNITY)
Admission: RE | Admit: 2016-05-13 | Discharge: 2016-05-13 | Disposition: A | Discharge status: Home / Self Care | Payer: Medicare Other | Source: Ambulatory Visit | Attending: Cardiology | Admitting: Cardiology

## 2016-05-13 DIAGNOSIS — Z9889 Other specified postprocedural states: Secondary | ICD-10-CM

## 2016-05-13 LAB — GLUCOSE, CAPILLARY
GLUCOSE-CAPILLARY: 147 mg/dL — AB (ref 65–99)
Glucose-Capillary: 141 mg/dL — ABNORMAL HIGH (ref 65–99)

## 2016-05-15 ENCOUNTER — Encounter (HOSPITAL_COMMUNITY)
Admission: RE | Admit: 2016-05-15 | Discharge: 2016-05-15 | Disposition: A | Discharge status: Home / Self Care | Payer: Medicare Other | Source: Ambulatory Visit | Attending: Cardiology | Admitting: Cardiology

## 2016-05-15 DIAGNOSIS — Z9889 Other specified postprocedural states: Secondary | ICD-10-CM | POA: Diagnosis not present

## 2016-05-15 LAB — GLUCOSE, CAPILLARY: Glucose-Capillary: 145 mg/dL — ABNORMAL HIGH (ref 65–99)

## 2016-05-18 ENCOUNTER — Telehealth: Payer: Self-pay | Admitting: Cardiology

## 2016-05-18 ENCOUNTER — Encounter (HOSPITAL_COMMUNITY)
Admission: RE | Admit: 2016-05-18 | Discharge: 2016-05-18 | Disposition: A | Discharge status: Home / Self Care | Payer: Medicare Other | Source: Ambulatory Visit | Attending: Cardiology | Admitting: Cardiology

## 2016-05-18 DIAGNOSIS — Z9889 Other specified postprocedural states: Secondary | ICD-10-CM | POA: Diagnosis not present

## 2016-05-18 NOTE — Telephone Encounter (Signed)
Pt states he needs to go to the dentist and have 2 crowns  replaced.  Pt is asking if it is okay with Dr Aundra Dubin to go to dentist and have crowns replaced. Pt is asking for a prescription for antibiotic to take prior to dental appointments, pt states he is not allergic to any antibiotics.   Pt states he occasionally goes to chiropractor to have his back adjusted but has not been since valve repair. Pt asking if okay with Dr Aundra Dubin to go to chiropractor.   Pt advised I will forward to Dr Aundra Dubin for review.

## 2016-05-18 NOTE — Progress Notes (Signed)
Reviewed home exercise guidelines with patient including endpoints, temperature precautions, target heart rate and rate of perceived exertion. Pt is walking 30-40 minutes, 3-4 days per week as his mode of home exercise. Pt voices understanding of instructions given. Sol Passer, MS, ACSM CCEP

## 2016-05-18 NOTE — Telephone Encounter (Signed)
Can use 2 g amoxicillin prior to dental work.  Ok to go to [employer], [employer].

## 2016-05-18 NOTE — Telephone Encounter (Signed)
Pt states he had 2 crowns knocked out during surgery.  Pt states he needs to go to the dentist and have them replaced.  Pt is asking if it is okay with Dr Aundra Dubin to go to dentist and have crowns replaced. Pt is aware I have sent in a prescription for amoxicillin for endocarditis prophylaxis prior to dental appointments.    Pt states he occasionally goes to chiropractor to have his back adjusted but has not been since valve repair. Pt asking if okay with Dr Aundra Dubin to go to chiropractor.  Pt advised I will forward to Dr Aundra Dubin for review.   05/20/16 LMTCB for pt  Discussed Dr Claris Gladden recommendations with pt.

## 2016-05-18 NOTE — Telephone Encounter (Signed)
LMTCB

## 2016-05-18 NOTE — Telephone Encounter (Signed)
New message      Pt had valve surgery 03-25-16.  He want to know if he can go to the dentist to have his caps put back in and see a chiropractor.  I told him to have the office call us for clearance-----but pt wanted to tell the nurse himself

## 2016-05-20 ENCOUNTER — Encounter (HOSPITAL_COMMUNITY)
Admission: RE | Admit: 2016-05-20 | Discharge: 2016-05-20 | Disposition: A | Discharge status: Home / Self Care | Payer: Medicare Other | Source: Ambulatory Visit | Attending: Cardiology | Admitting: Cardiology

## 2016-05-20 ENCOUNTER — Telehealth: Payer: Self-pay | Admitting: Cardiology

## 2016-05-20 DIAGNOSIS — Z9889 Other specified postprocedural states: Secondary | ICD-10-CM | POA: Diagnosis not present

## 2016-05-20 MED ORDER — AMOXICILLIN 500 MG PO TABS: ORAL_TABLET | ORAL | 0 refills | Status: DC

## 2016-05-20 NOTE — Telephone Encounter (Signed)
New message ° ° ° ° ° ° °Pt returning nurse call  °

## 2016-05-20 NOTE — Telephone Encounter (Signed)
See phone note 05/18/16

## 2016-05-22 ENCOUNTER — Encounter (HOSPITAL_COMMUNITY)
Admission: RE | Admit: 2016-05-22 | Discharge: 2016-05-22 | Disposition: A | Discharge status: Home / Self Care | Payer: Medicare Other | Source: Ambulatory Visit | Attending: Cardiology | Admitting: Cardiology

## 2016-05-22 DIAGNOSIS — Z9889 Other specified postprocedural states: Secondary | ICD-10-CM

## 2016-05-22 NOTE — Progress Notes (Signed)
[name] 70 y.o. male Nutrition Note Spoke with pt. Nutrition Plan and Nutrition Survey goals reviewed with pt. Pt is not following the Therapeutic Lifestyle Changes diet at this time. Pt reports he followed the TLC diet "for a long time until about a year ago." Pt wants to lose wt. Ptstates he weighed 373 lb and lost 70 lb "on my own before my lap band surgery." Pt reports his lowest post-lap band surgery was 178 lb. Pt c/o gaining wt to 216 due to decreased energy due to CHF. Pt has been trying to lose wt by changing his diet and exercising more. Wt loss tips reviewed. Pt is a diabetic. Last A1c indicates blood glucose not optimally controlled. Per discussion, pt has had difficulty getting his CBG's down since his mini MVR. This Probation officer went over Diabetes Education test results. Pt checks CBG's 1 time a day. Fasting CBG's reportedly ~178 mg/dL. Pt with dx of CHF. Per discussion, pt does not use canned/convenience foods often. Pt eats out frequently. Barriers to diet/lifestyle changes include pt's wife is not "on board with all of the things I need to do." Pt is taking Coumadin. Pt is aware of the need to follow a diet with consistent intake of vitamin K foods. Pt expressed understanding of the information reviewed. Pt aware of nutrition education classes offered.  Lab Results  Component Value Date   HGBA1C 7.2 (H) 03/23/2016   Wt Readings from Last 3 Encounters:  05/07/16 216 lb 4.3 oz (98.1 kg)  04/13/16 213 lb (96.6 kg)  04/10/16 213 lb (96.6 kg)    Nutrition Diagnosis ? Food-and nutrition-related knowledge deficit related to lack of exposure to information as related to diagnosis of: ? CVD ? DM ? Obesity related to excessive energy intake as evidenced by a BMI of 33.2  Nutrition Intervention ? Pt's individual nutrition plan reviewed with pt. ? Benefits of adopting Therapeutic Lifestyle Changes discussed when Med[employer]s reviewed. ? Pt to attend the Portion Distortion class ? Pt to attend  the Diabetes Q & A class ? Pt given handouts for: ? Nutrition I class ? Nutrition II class ? Diabetes Blitz Class  ? Continue client-centered nutrition education by RD, as part of interdisciplinary care. Goal(s) ? Pt to identify and limit food sources of saturated fat, trans fat, and sodium ? Pt to identify food quantities necessary to achieve weight loss of 6-24 lb (2.7-10.9 kg) at graduation from cardiac rehab.  ? CBG concentrations in the normal range or as close to normal as is safely possible. Monitor and Evaluate progress toward nutrition goal with team. [name], M.Ed, RD, LDN, CDE 05/22/2016 1:32 PM

## 2016-05-25 ENCOUNTER — Telehealth: Payer: Self-pay | Admitting: Cardiology

## 2016-05-25 ENCOUNTER — Encounter (HOSPITAL_COMMUNITY)
Admission: RE | Admit: 2016-05-25 | Discharge: 2016-05-25 | Disposition: A | Discharge status: Home / Self Care | Payer: Medicare Other | Source: Ambulatory Visit | Attending: Cardiology | Admitting: Cardiology

## 2016-05-25 DIAGNOSIS — M9903 Segmental and somatic dysfunction of lumbar region: Secondary | ICD-10-CM | POA: Diagnosis not present

## 2016-05-25 DIAGNOSIS — M5386 Other specified dorsopathies, lumbar region: Secondary | ICD-10-CM | POA: Diagnosis not present

## 2016-05-25 DIAGNOSIS — Z9889 Other specified postprocedural states: Secondary | ICD-10-CM | POA: Diagnosis not present

## 2016-05-25 DIAGNOSIS — M9902 Segmental and somatic dysfunction of thoracic region: Secondary | ICD-10-CM | POA: Diagnosis not present

## 2016-05-25 DIAGNOSIS — M5137 Other intervertebral disc degeneration, lumbosacral region: Secondary | ICD-10-CM | POA: Diagnosis not present

## 2016-05-25 DIAGNOSIS — M9904 Segmental and somatic dysfunction of sacral region: Secondary | ICD-10-CM | POA: Diagnosis not present

## 2016-05-25 DIAGNOSIS — M5414 Radiculopathy, thoracic region: Secondary | ICD-10-CM | POA: Diagnosis not present

## 2016-05-25 NOTE — Telephone Encounter (Signed)
Mechele Claude from [facility] calls in reporting patient came to see them today complaining of light-headedness/SOB/abd bloating/wt gain.   Patient reports wt gain of 5/6 lbs according to their home scale.  CR reports he weighed 99 kg on Friday and today weighed 101.6 kg.  Recent MVR in August, and OV w/ [place] on 8/18.  Echo completed on 8/25 w/ normal EF. Will forward to [facility] for advisement.  Patient is aware we will call him once advised by physician.

## 2016-05-25 NOTE — Telephone Encounter (Signed)
I spoke with the pt and made him aware of instructions per Dr Aundra Dubin.  The pt also complained of muscle and joint aches and pain. The pt is currently taking Crestor and I advised that he hold this for 7 days to see if symptoms improve.  The pt is scheduled for follow-up with Richardson Dopp PA-C on 06/01/16 to address fluid status and muscle and joint pain.

## 2016-05-25 NOTE — Telephone Encounter (Signed)
F/u ° ° ° ° ° °Pt returning nurse call.  °

## 2016-05-25 NOTE — Progress Notes (Signed)
Pt at cardiac rehab c/o dypsnea on exertion, more than usual, with fatigue, abdominal bloating and mild lightheadedness,  Symptoms began Saturday night.   Pt is  reporting 6lb weight gain overnight on his home scales, which correlates with cardiac rehab weights. Pt denies increased PO sodium intake.  Lungs clear, no extremity edema, abdomen round and distended, positive bowel sounds.  PC to Dr. Aundra Dubin office to report pt symptoms. Triage nurse will review information with Dr. Aundra Dubin and further advise pt at his home phone.  Pt verbalized understanding.

## 2016-05-25 NOTE — Telephone Encounter (Signed)
New message    [facility] calling    Pt C/O Shortness Of Breath: STAT if SOB developed within the last 24 hours or pt is noticeably SOB on the phone  1. Are you currently SOB (can you hear that pt is SOB on the phone)? [facility]- [facility] - patient there now   2. How long have you been experiencing SOB? Per Mechele Claude - patient verbalized Saturday night    3. Are you SOB when sitting or when up moving around? Per Mechele Claude -moving around with activities  4. Are you currently experiencing any other symptoms? Per Mechele Claude -  abdomen  bloating still lightheadedness.    Vital signs 95 % , hr 81 118/60 weight  101.6 kg  - per patient 6 lbs from scales at homes

## 2016-05-25 NOTE — Telephone Encounter (Signed)
He should be taking Lasix 40 qam/20 qpm.  Increase to 60 qam/40 qpm with an extra 20 mEq KCl for 3 days then back to 40 qam/20 qpm.  Needs office appointment in next week or so.

## 2016-05-27 ENCOUNTER — Encounter (HOSPITAL_COMMUNITY)
Admission: RE | Admit: 2016-05-27 | Discharge: 2016-05-27 | Disposition: A | Discharge status: Home / Self Care | Payer: Medicare Other | Source: Ambulatory Visit | Attending: Cardiology | Admitting: Cardiology

## 2016-05-27 ENCOUNTER — Ambulatory Visit (INDEPENDENT_AMBULATORY_CARE_PROVIDER_SITE_OTHER): Payer: Medicare Other | Admitting: *Deleted

## 2016-05-27 DIAGNOSIS — M9903 Segmental and somatic dysfunction of lumbar region: Secondary | ICD-10-CM | POA: Diagnosis not present

## 2016-05-27 DIAGNOSIS — Z5181 Encounter for therapeutic drug level monitoring: Secondary | ICD-10-CM | POA: Diagnosis not present

## 2016-05-27 DIAGNOSIS — M9902 Segmental and somatic dysfunction of thoracic region: Secondary | ICD-10-CM | POA: Diagnosis not present

## 2016-05-27 DIAGNOSIS — Z9889 Other specified postprocedural states: Secondary | ICD-10-CM

## 2016-05-27 DIAGNOSIS — M5386 Other specified dorsopathies, lumbar region: Secondary | ICD-10-CM | POA: Diagnosis not present

## 2016-05-27 DIAGNOSIS — M9904 Segmental and somatic dysfunction of sacral region: Secondary | ICD-10-CM | POA: Diagnosis not present

## 2016-05-27 DIAGNOSIS — M5414 Radiculopathy, thoracic region: Secondary | ICD-10-CM | POA: Diagnosis not present

## 2016-05-27 DIAGNOSIS — M5137 Other intervertebral disc degeneration, lumbosacral region: Secondary | ICD-10-CM | POA: Diagnosis not present

## 2016-05-27 LAB — POCT INR: INR: 2.4

## 2016-05-29 ENCOUNTER — Encounter (HOSPITAL_COMMUNITY)
Admission: RE | Admit: 2016-05-29 | Discharge: 2016-05-29 | Disposition: A | Discharge status: Home / Self Care | Payer: Medicare Other | Source: Ambulatory Visit | Attending: Cardiology | Admitting: Cardiology

## 2016-05-29 DIAGNOSIS — Z9889 Other specified postprocedural states: Secondary | ICD-10-CM

## 2016-05-29 DIAGNOSIS — M5414 Radiculopathy, thoracic region: Secondary | ICD-10-CM | POA: Diagnosis not present

## 2016-05-29 DIAGNOSIS — M5386 Other specified dorsopathies, lumbar region: Secondary | ICD-10-CM | POA: Diagnosis not present

## 2016-05-29 DIAGNOSIS — M9902 Segmental and somatic dysfunction of thoracic region: Secondary | ICD-10-CM | POA: Diagnosis not present

## 2016-05-29 DIAGNOSIS — M5137 Other intervertebral disc degeneration, lumbosacral region: Secondary | ICD-10-CM | POA: Diagnosis not present

## 2016-05-29 DIAGNOSIS — M9904 Segmental and somatic dysfunction of sacral region: Secondary | ICD-10-CM | POA: Diagnosis not present

## 2016-05-29 DIAGNOSIS — M9903 Segmental and somatic dysfunction of lumbar region: Secondary | ICD-10-CM | POA: Diagnosis not present

## 2016-06-01 ENCOUNTER — Ambulatory Visit: Payer: Medicare Other | Admitting: Physician Assistant

## 2016-06-01 ENCOUNTER — Encounter (HOSPITAL_COMMUNITY)
Admission: RE | Admit: 2016-06-01 | Discharge: 2016-06-01 | Disposition: A | Discharge status: Home / Self Care | Payer: Medicare Other | Source: Ambulatory Visit | Attending: Cardiology | Admitting: Cardiology

## 2016-06-01 DIAGNOSIS — Z9889 Other specified postprocedural states: Secondary | ICD-10-CM | POA: Diagnosis not present

## 2016-06-02 DIAGNOSIS — M9902 Segmental and somatic dysfunction of thoracic region: Secondary | ICD-10-CM | POA: Diagnosis not present

## 2016-06-02 DIAGNOSIS — M9903 Segmental and somatic dysfunction of lumbar region: Secondary | ICD-10-CM | POA: Diagnosis not present

## 2016-06-02 DIAGNOSIS — M5386 Other specified dorsopathies, lumbar region: Secondary | ICD-10-CM | POA: Diagnosis not present

## 2016-06-02 DIAGNOSIS — M5414 Radiculopathy, thoracic region: Secondary | ICD-10-CM | POA: Diagnosis not present

## 2016-06-02 DIAGNOSIS — M5137 Other intervertebral disc degeneration, lumbosacral region: Secondary | ICD-10-CM | POA: Diagnosis not present

## 2016-06-02 DIAGNOSIS — M9904 Segmental and somatic dysfunction of sacral region: Secondary | ICD-10-CM | POA: Diagnosis not present

## 2016-06-03 ENCOUNTER — Encounter (HOSPITAL_COMMUNITY)
Admission: RE | Admit: 2016-06-03 | Discharge: 2016-06-03 | Disposition: A | Discharge status: Home / Self Care | Payer: Medicare Other | Source: Ambulatory Visit | Attending: Cardiology | Admitting: Cardiology

## 2016-06-03 DIAGNOSIS — M5137 Other intervertebral disc degeneration, lumbosacral region: Secondary | ICD-10-CM | POA: Diagnosis not present

## 2016-06-03 DIAGNOSIS — Z9889 Other specified postprocedural states: Secondary | ICD-10-CM

## 2016-06-03 DIAGNOSIS — M9904 Segmental and somatic dysfunction of sacral region: Secondary | ICD-10-CM | POA: Diagnosis not present

## 2016-06-03 DIAGNOSIS — M9902 Segmental and somatic dysfunction of thoracic region: Secondary | ICD-10-CM | POA: Diagnosis not present

## 2016-06-03 DIAGNOSIS — M9903 Segmental and somatic dysfunction of lumbar region: Secondary | ICD-10-CM | POA: Diagnosis not present

## 2016-06-03 DIAGNOSIS — M5386 Other specified dorsopathies, lumbar region: Secondary | ICD-10-CM | POA: Diagnosis not present

## 2016-06-03 DIAGNOSIS — M5414 Radiculopathy, thoracic region: Secondary | ICD-10-CM | POA: Diagnosis not present

## 2016-06-04 NOTE — Progress Notes (Signed)
Cardiac Individual Treatment Plan  Patient Details  Name: [name] MRN: [id number] Date of Birth: 09-12-1945 Referring Provider:   Flowsheet Row CARDIAC REHAB PHASE II ORIENTATION from 05/07/2016 in [facility]  Referring Provider  Aundra Dubin, [place],MD      Initial Encounter Date:  [place] PHASE II ORIENTATION from 05/07/2016 in [facility]  Date  05/07/16  Referring Provider  Aundra Dubin, [place],MD      Visit Diagnosis: 03/25/16 S/P MVR (mitral valve repair)  Patient's Home Medications on Admission:  Current Outpatient Prescriptions:  .  amoxicillin (AMOXIL) 500 MG tablet, Take 4 tablets (2 g) by mouth 30-60 minutes prior to dental appointment, Disp: 12 tablet, Rfl: 0 .  aspirin EC 81 MG tablet, Take 81 mg by mouth daily., Disp: , Rfl:  .  carvedilol (COREG) 3.125 MG tablet, Take 1 tablet (3.125 mg total) by mouth 2 (two) times daily., Disp: 60 tablet, Rfl: 3 .  cetirizine (ZYRTEC) 10 MG chewable tablet, Chew 10 mg by mouth daily., Disp: , Rfl:  .  fluticasone (FLONASE) 50 MCG/ACT nasal spray, Place 1 spray into both nostrils daily as needed for allergies or rhinitis., Disp: , Rfl:  .  furosemide (LASIX) 40 MG tablet, Take one (1) tablet (40 mg total) by mouth every morning. Take half (1/2) tablet (20 mg total) by mouth mid-day., Disp: , Rfl:  .  HYDROcodone-acetaminophen (NORCO) 10-325 MG tablet, Take 1 tablet by mouth every 4 (four) hours as needed for moderate pain., Disp: 20 tablet, Rfl: 0 .  metFORMIN (GLUCOPHAGE) 500 MG tablet, Take 500 mg by mouth 2 (two) times daily with a meal., Disp: , Rfl:  .  nitroGLYCERIN (NITROSTAT) 0.4 MG SL tablet, Place 1 tablet (0.4 mg total) under the tongue every 5 (five) minutes as needed for chest pain., Disp: 25 tablet, Rfl: 3 .  potassium chloride SA (K-DUR,KLOR-CON) 20 MEQ tablet, Take 20 mEq by mouth 2 (two) times daily., Disp: , Rfl:  .  rosuvastatin (CRESTOR) 10 MG  tablet, Take 5 mg by mouth daily., Disp: , Rfl:  .  tadalafil (CIALIS) 20 MG tablet, Take 20 mg by mouth daily as needed for erectile dysfunction., Disp: , Rfl:  .  traZODone (DESYREL) 100 MG tablet, Take 100 mg by mouth at bedtime as needed for sleep., Disp: , Rfl:  .  VENTOLIN HFA 108 (90 Base) MCG/ACT inhaler, Inhale 1 puff into the lungs every 4 (four) hours as needed for wheezing or shortness of breath. , Disp: , Rfl:  .  warfarin (COUMADIN) 5 MG tablet, Take 1 tablet (5 mg total) by mouth daily at 6 PM. (Patient taking differently: Take 5 mg by mouth daily at 6 PM. Pt takes 1.5 tabs (7.5mg  total) MWF, 1 tab (5mg  total) all other days), Disp: 30 tablet, Rfl: 3  Past Medical History: Past Medical History:  Diagnosis Date  . Allergic rhinitis   . Anxiety   . Cancer ([place])    hx of skin cancers   . Coronary artery disease    a. s/p MI & CABG; b. s/p PCI Diag;  c. Cath 12/2011: LAD diff dzs/small, Diag 75% isr, 90 dist to stent (small), LCX & RCA occluded, VG->OM 40, VG->PDA patent - med rx.  . Depression   . Diabetes mellitus    ORAL MEDS  . Diastolic CHF, chronic (HCC)    a. EF 55-60% by echo 2007  . Dysrhythmia    "abnormal beat"  .  GERD (gastroesophageal reflux disease)    "not anymore" " I had a lap band"  . History of diverticulitis of colon    5 YRS AGO  . History of pneumonia    2017  . History of transient ischemic attack (TIA)    CAROTID DOPPLERS NOV 2011  0-39& BIL. STENOSIS  . Hyperlipidemia   . Hypertension   . Mitral regurgitation   . Myocardial infarction   . Neuromuscular disorder ([place])    left arm numbness   . 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 PTSD (post-traumatic stress disorder)    from [place]  . 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 Shortness of breath dyspnea    with exertion  . Sleep apnea    cpap- see ov note in [facility] 05/12/11 for settings     Tobacco Use: History  Smoking Status  . Never Smoker  Smokeless Tobacco  . Never Used    Labs: Recent Review Flowsheet Data    Labs for ITP Cardiac and Pulmonary Rehab Latest Ref Rng & Units 03/25/2016 03/25/2016 03/25/2016 03/25/2016 03/26/2016   Cholestrol 125 - 200 mg/dL - - - - -   LDLCALC <130 mg/dL - - - - -   HDL >=40 mg/dL - - - - -   Trlycerides <150 mg/dL - - - - -   Hemoglobin A1c 4.8 - 5.6 % - - - - -   PHART 7.350 - 7.450 7.314(L) 7.300(L) 7.317(L) - -   PCO2ART 35.0 - 45.0 mmHg 42.8 40.5 41.5 - -   HCO3 20.0 - 24.0 mEq/L 22.0 20.0 21.1 - -   TCO2 0 - 100 mmol/L 23 21 22 23 22    ACIDBASEDEF 0.0 - 2.0 mmol/L 4.0(H) 6.0(H) 5.0(H) - -   O2SAT % 94.0 98.0 95.0 - -      Capillary Blood Glucose: Lab Results  Component Value Date   GLUCAP 145 (H) 05/15/2016   GLUCAP 147 (H) 05/13/2016   GLUCAP 141 (H) 05/13/2016   GLUCAP 148 (H) 05/11/2016   GLUCAP 176 (H) 05/11/2016     Exercise Target Goals:    Exercise Program Goal: Individual exercise prescription set with THRR, safety & activity barriers. Participant demonstrates ability to understand and report RPE using BORG scale, to self-measure pulse accurately, and to acknowledge the importance of the exercise prescription.  Exercise Prescription Goal: Starting with aerobic activity 30 plus minutes a day, 3 days per week for initial exercise prescription. Provide home exercise prescription and guidelines that participant acknowledges understanding prior to discharge.  Activity Barriers & Risk Stratification:     Activity Barriers & Cardiac Risk Stratification - 05/07/16 0914      Activity Barriers & Cardiac Risk Stratification   Activity Barriers Right Hip Replacement;Other (comment)   Comments L ankle pain   Cardiac Risk Stratification High      6 Minute Walk:     6  Minute Walk    Row Name 05/07/16 1533         6 Minute Walk   Phase Initial     Distance 1653 feet     Walk Time 6 minutes     # of Rest Breaks 0     MPH 3.13     METS 3.1  RPE 11     VO2 Peak 10.69     Symptoms No     Resting HR 80 bpm     Resting BP 120/76     Max Ex. HR 85 bpm     Max Ex. BP 132/76     2 Minute Post BP 108/64        Initial Exercise Prescription:     Initial Exercise Prescription - 05/07/16 1500      Date of Initial Exercise RX and Referring Provider   Date 05/07/16   Referring Provider Aundra Dubin, [place],MD     Treadmill   MPH 2.6   Grade 0   Minutes 10   METs 2.99     NuStep   Level 3   Minutes 10   METs 2     Cybex   Level 2   Minutes 10   METs 2     Prescription Details   Frequency (times per week) 3   Duration Progress to 30 minutes of continuous aerobic without signs/symptoms of physical distress     Intensity   THRR 40-80% of Max Heartrate 60-120   Ratings of Perceived Exertion 11-13   Perceived Dyspnea 0-4     Progression   Progression Continue to progress workloads to maintain intensity without signs/symptoms of physical distress.     Resistance Training   Training Prescription Yes   Weight 2lbs   Reps 10-12      Perform Capillary Blood Glucose checks as needed.  Exercise Prescription Changes:     Exercise Prescription Changes    Row Name 05/29/16 1000             Exercise Review   Progression Yes         Response to Exercise   Blood Pressure (Admit) 104/62       Blood Pressure (Exercise) 124/68       Blood Pressure (Exit) 98/60       Heart Rate (Admit) 79 bpm       Heart Rate (Exercise) 114 bpm       Heart Rate (Exit) 80 bpm       Rating of Perceived Exertion (Exercise) 11       Comments Reviewed home exercise guidelines on 05/18/16.       Duration Progress to 30 minutes of continuous aerobic without signs/symptoms of physical distress       Intensity THRR unchanged         Progression    Progression Continue to progress workloads to maintain intensity without signs/symptoms of physical distress.       Average METs 4.3         Resistance Training   Training Prescription Yes       Weight 5lbs       Reps 10-12         Treadmill   MPH 3       Grade 3       Minutes 10       METs 4.54         NuStep   Level 4       Minutes 10       METs 4.1         Cybex   Level 3.2       Minutes 10         Home Exercise Plan   Plans to continue exercise at Home       Frequency Add 3 additional days to  program exercise sessions.          Exercise Comments:     Exercise Comments    Row Name 05/18/16 1113 05/27/16 1016         Exercise Comments Reviewed home exercise guidelines with patient. Currently walking 3-4 days at home in addition to cardiac rehab. Participant walking 1 hour, 3-4 days per week.         Discharge Exercise Prescription (Final Exercise Prescription Changes):     Exercise Prescription Changes - 05/29/16 1000      Exercise Review   Progression Yes     Response to Exercise   Blood Pressure (Admit) 104/62   Blood Pressure (Exercise) 124/68   Blood Pressure (Exit) 98/60   Heart Rate (Admit) 79 bpm   Heart Rate (Exercise) 114 bpm   Heart Rate (Exit) 80 bpm   Rating of Perceived Exertion (Exercise) 11   Comments Reviewed home exercise guidelines on 05/18/16.   Duration Progress to 30 minutes of continuous aerobic without signs/symptoms of physical distress   Intensity THRR unchanged     Progression   Progression Continue to progress workloads to maintain intensity without signs/symptoms of physical distress.   Average METs 4.3     Resistance Training   Training Prescription Yes   Weight 5lbs   Reps 10-12     Treadmill   MPH 3   Grade 3   Minutes 10   METs 4.54     NuStep   Level 4   Minutes 10   METs 4.1     Cybex   Level 3.2   Minutes 10     Home Exercise Plan   Plans to continue exercise at Home   Frequency Add 3 additional  days to program exercise sessions.      Nutrition:  Target Goals: Understanding of nutrition guidelines, daily intake of sodium 1500mg , cholesterol 200mg , calories 30% from fat and 7% or less from saturated fats, daily to have 5 or more servings of fruits and vegetables.  Biometrics:     Pre Biometrics - 05/07/16 1543      Pre Biometrics   Waist Circumference 45 inches   Hip Circumference 41 inches   Waist to Hip Ratio 1.1 %   Triceps Skinfold 9 mm   % Body Fat 30 %   Grip Strength 44 kg   Flexibility 10 in   Single Leg Stand 8.25 seconds       Nutrition Therapy Plan and Nutrition Goals:     Nutrition Therapy & Goals - 05/11/16 0819      Nutrition Therapy   Diet Carb Modified, Therapeutic Lifestyle Changes   Drug/Food Interactions Coumadin/Vit K     Personal Nutrition Goals   Personal Goal #1 1-2 lb wt loss/week to a wt loss goal of 6-24 lb at graduation from Cardiac Rehab   Personal Goal #2 Improved glycemic control as evidenced by an A1c of 7 or less     Intervention Plan   Intervention Prescribe, educate and counsel regarding individualized specific dietary modifications aiming towards targeted core components such as weight, hypertension, lipid management, diabetes, heart failure and other comorbidities.   Expected Outcomes Short Term Goal: Understand basic principles of dietary content, such as calories, fat, sodium, cholesterol and nutrients.;Long Term Goal: Adherence to prescribed nutrition plan.      Nutrition Discharge: Nutrition Scores:     Nutrition Assessments - 05/22/16 1332      MEDFICTS Scores   Pre Score 89  Nutrition Goals Re-Evaluation:   Psychosocial: Target Goals: Acknowledge presence or absence of depression, maximize coping skills, provide positive support system. Participant is able to verbalize types and ability to use techniques and skills needed for reducing stress and depression.  Initial Review & Psychosocial Screening:      Initial Psych Review & Screening - 05/07/16 1628      Family Dynamics   Good Support System? Yes   Comments Pt wife accompanied him for his orientation appt with cardiac rehab.     Barriers   Psychosocial barriers to participate in program There are no identifiable barriers or psychosocial needs.      Quality of Life Scores:     Quality of Life - 05/07/16 1545      Quality of Life Scores   Health/Function Pre 22 %   Socioeconomic Pre 29 %   Psych/Spiritual Pre 30 %   Family Pre 27.6 %   GLOBAL Pre 25.82 %      PHQ-9: Recent Review Flowsheet Data    Depression screen [facility] 2/9 05/11/2016 02/14/2014   Decreased Interest 0 0   Down, Depressed, Hopeless 0 0   PHQ - 2 Score 0 0      Psychosocial Evaluation and Intervention:   Psychosocial Re-Evaluation:     Psychosocial Re-Evaluation    Row Name 06/04/16 1604             Psychosocial Re-Evaluation   Interventions Encouraged to attend Cardiac Rehabilitation for the exercise       Continued Psychosocial Services Needed No          Vocational Rehabilitation: Provide vocational rehab assistance to qualifying candidates.   Vocational Rehab Evaluation & Intervention:     Vocational Rehab - 05/07/16 1629      Initial Vocational Rehab Evaluation & Intervention   Assessment shows need for Vocational Rehabilitation No     Discharge Vocational Rehab   Discharge Vocational Rehabilitation Pt does not plan to return to competive employment.      Education: Education Goals: Education classes will be provided on a weekly basis, covering required topics. Participant will state understanding/return demonstration of topics presented.  Learning Barriers/Preferences:     Learning Barriers/Preferences - 05/07/16 0915      Learning Barriers/Preferences   Learning Barriers Sight   Learning Preferences Skilled Demonstration;Video;Written Material;Verbal Instruction;Individual Instruction;Pictoral;Group  Instruction;Computer/Internet;Audio      Education Topics: Count Your Pulse:  -Group instruction provided by verbal instruction, demonstration, patient participation and written materials to support subject.  Instructors address importance of being able to find your pulse and how to count your pulse when at home without a heart monitor.  Patients get hands on experience counting their pulse with staff help and individually.   Heart Attack, Angina, and Risk Factor Modification:  -Group instruction provided by verbal instruction, video, and written materials to support subject.  Instructors address signs and symptoms of angina and heart attacks.    Also discuss risk factors for heart disease and how to make changes to improve heart health risk factors.   Functional Fitness:  -Group instruction provided by verbal instruction, demonstration, patient participation, and written materials to support subject.  Instructors address safety measures for doing things around the house.  Discuss how to get up and down off the floor, how to pick things up properly, how to safely get out of a chair without assistance, and balance training.   Meditation and Mindfulness:  -Group instruction provided by verbal instruction, patient participation, and  written materials to support subject.  Instructor addresses importance of mindfulness and meditation practice to help reduce stress and improve awareness.  Instructor also leads participants through a meditation exercise.    Stretching for Flexibility and Mobility:  -Group instruction provided by verbal instruction, patient participation, and written materials to support subject.  Instructors lead participants through series of stretches that are designed to increase flexibility thus improving mobility.  These stretches are additional exercise for major muscle groups that are typically performed during regular warm up and cool down.   Hands Only CPR Anytime:   -Group instruction provided by verbal instruction, video, patient participation and written materials to support subject.  Instructors co-teach with AHA video for hands only CPR.  Participants get hands on experience with mannequins.   Nutrition I class: Heart Healthy Eating:  -Group instruction provided by PowerPoint slides, verbal discussion, and written materials to support subject matter. The instructor gives an explanation and review of the Therapeutic Lifestyle Changes diet recommendations, which includes a discussion on lipid goals, dietary fat, sodium, fiber, plant stanol/sterol esters, sugar, and the components of a well-balanced, healthy diet. Flowsheet Row CARDIAC REHAB PHASE II EXERCISE from 05/22/2016 in [place]  Date  05/22/16  Educator  RD  Instruction Review Code  Not applicable [class handouts given]      Nutrition II class: Lifestyle Skills:  -Group instruction provided by PowerPoint slides, verbal discussion, and written materials to support subject matter. The instructor gives an explanation and review of label reading, grocery shopping for heart health, heart healthy recipe modifications, and ways to make healthier choices when eating out. Flowsheet Row CARDIAC REHAB PHASE II EXERCISE from 05/22/2016 in [place]  Date  05/22/16  Educator  RD  Instruction Review Code  Not applicable [class handouts given]      Diabetes Question & Answer:  -Group instruction provided by PowerPoint slides, verbal discussion, and written materials to support subject matter. The instructor gives an explanation and review of diabetes co-morbidities, pre- and post-prandial blood glucose goals, pre-exercise blood glucose goals, signs, symptoms, and treatment of hypoglycemia and hyperglycemia, and foot care basics.   Diabetes Blitz:  -Group instruction provided by PowerPoint slides, verbal discussion, and written materials to  support subject matter. The instructor gives an explanation and review of the physiology behind type 1 and type 2 diabetes, diabetes medications and rational behind using different medications, pre- and post-prandial blood glucose recommendations and Hemoglobin A1c goals, diabetes diet, and exercise including blood glucose guidelines for exercising safely.  Flowsheet Row CARDIAC REHAB PHASE II EXERCISE from 05/22/2016 in [place]  Date  05/22/16  Educator  RD  Instruction Review Code  Not applicable [class handouts given]      Portion Distortion:  -Group instruction provided by PowerPoint slides, verbal discussion, written materials, and food models to support subject matter. The instructor gives an explanation of serving size versus portion size, changes in portions sizes over the last 20 years, and what consists of a serving from each food group.   Stress Management:  -Group instruction provided by verbal instruction, video, and written materials to support subject matter.  Instructors review role of stress in heart disease and how to cope with stress positively.     Exercising on Your Own:  -Group instruction provided by verbal instruction, power point, and written materials to support subject.  Instructors discuss benefits of exercise, components of exercise, frequency and intensity of  exercise, and end points for exercise.  Also discuss use of nitroglycerin and activating EMS.  Review options of places to exercise outside of rehab.  Review guidelines for sex with heart disease.   Cardiac Drugs I:  -Group instruction provided by verbal instruction and written materials to support subject.  Instructor reviews cardiac drug classes: antiplatelets, anticoagulants, beta blockers, and statins.  Instructor discusses reasons, side effects, and lifestyle considerations for each drug class.   Cardiac Drugs II:  -Group instruction provided by verbal instruction and  written materials to support subject.  Instructor reviews cardiac drug classes: angiotensin converting enzyme inhibitors (ACE-I), angiotensin II receptor blockers (ARBs), nitrates, and calcium channel blockers.  Instructor discusses reasons, side effects, and lifestyle considerations for each drug class.   Anatomy and Physiology of the Circulatory System:  -Group instruction provided by verbal instruction, video, and written materials to support subject.  Reviews functional anatomy of heart, how it relates to various diagnoses, and what role the heart plays in the overall system.   Knowledge Questionnaire Score:     Knowledge Questionnaire Score - 05/11/16 0826      Knowledge Questionnaire Score   Pre Score 18/24     DM 12/15      Core Components/Risk Factors/Patient Goals at Admission:     Personal Goals and Risk Factors at Admission - 05/07/16 0915      Core Components/Risk Factors/Patient Goals on Admission    Weight Management Yes;Weight Loss   Intervention Weight Management: Develop a combined nutrition and exercise program designed to reach desired caloric intake, while maintaining appropriate intake of nutrient and fiber, sodium and fats, and appropriate energy expenditure required for the weight goal.;Weight Management: Provide education and appropriate resources to help participant work on and attain dietary goals.;Weight Management/Obesity: Establish reasonable short term and long term weight goals.;Obesity: Provide education and appropriate resources to help participant work on and attain dietary goals.   Expected Outcomes Long Term: Adherence to nutrition and physical activity/exercise program aimed toward attainment of established weight goal;Short Term: Continue to assess and modify interventions until short term weight is achieved;Weight Maintenance: Understanding of the daily nutrition guidelines, which includes 25-35% calories from fat, 7% or less cal from saturated fats,  less than 200mg  cholesterol, less than 1.5gm of sodium, & 5 or more servings of fruits and vegetables daily;Weight Loss: Understanding of general recommendations for a balanced deficit meal plan, which promotes 1-2 lb weight loss per week and includes a negative energy balance of [phone] kcal/d;Understanding recommendations for meals to include 15-35% energy as protein, 25-35% energy from fat, 35-60% energy from carbohydrates, less than 200mg  of dietary cholesterol, 20-35 gm of total fiber daily;Understanding of distribution of calorie intake throughout the day with the consumption of 4-5 meals/snacks   Sedentary Yes   Intervention Provide advice, education, support and counseling about physical activity/exercise needs.;Develop an individualized exercise prescription for aerobic and resistive training based on initial evaluation findings, risk stratification, comorbidities and participant's personal goals.   Expected Outcomes Achievement of increased cardiorespiratory fitness and enhanced flexibility, muscular endurance and strength shown through measurements of functional capacity and personal statement of participant.   Increase Strength and Stamina Yes   Intervention Provide advice, education, support and counseling about physical activity/exercise needs.;Develop an individualized exercise prescription for aerobic and resistive training based on initial evaluation findings, risk stratification, comorbidities and participant's personal goals.   Expected Outcomes Achievement of increased cardiorespiratory fitness and enhanced flexibility, muscular endurance and strength shown through measurements of functional capacity  and personal statement of participant.   Diabetes Yes   Intervention Provide education about signs/symptoms and action to take for hypo/hyperglycemia.;Provide education about proper nutrition, including hydration, and aerobic/resistive exercise prescription along with prescribed medications  to achieve blood glucose in normal ranges: Fasting glucose 65-99 mg/dL   Expected Outcomes Short Term: Participant verbalizes understanding of the signs/symptoms and immediate care of hyper/hypoglycemia, proper foot care and importance of medication, aerobic/resistive exercise and nutrition plan for blood glucose control.;Long Term: Attainment of HbA1C < 7%.   Personal Goal Other Yes   Personal Goal short: develop an exercise routine   long: gain energy and stamina and get back on track with health and learn limitations   Intervention Provide exercise programming to improve cardiovascular endurance and provide education on cardiac awareness and risk factors   Expected Outcomes Pt will be more aware of cardiac condition, improve strength and stamina and be complaint with exercise routine      Core Components/Risk Factors/Patient Goals Review:      Goals and Risk Factor Review    Row Name 05/29/16 1017             Core Components/Risk Factors/Patient Goals Review   Personal Goals Review Other       Review Participant has established an exercise routine. Currently walking 1 hour at least 3-4 days per week.       Expected Outcomes Maintain regular aerobic and resistance training routine to help improve cardiorespiratory fitness.          Core Components/Risk Factors/Patient Goals at Discharge (Final Review):      Goals and Risk Factor Review - 05/29/16 1017      Core Components/Risk Factors/Patient Goals Review   Personal Goals Review Other   Review Participant has established an exercise routine. Currently walking 1 hour at least 3-4 days per week.   Expected Outcomes Maintain regular aerobic and resistance training routine to help improve cardiorespiratory fitness.      ITP Comments:     ITP Comments    Row Name 05/07/16 0912           ITP Comments Dr. Fransico Him, Medical Director          Comments: Jacoby is making expected progress toward personal goals after  completing 12 sessions. Recommend continued exercise and life style modification education including  stress management and relaxation techniques to decrease cardiac risk profile. Barnet Pall, RN,BSN 06/04/2016 4:06 PM

## 2016-06-05 ENCOUNTER — Ambulatory Visit (INDEPENDENT_AMBULATORY_CARE_PROVIDER_SITE_OTHER): Payer: Medicare Other | Admitting: Cardiology

## 2016-06-05 ENCOUNTER — Encounter (HOSPITAL_COMMUNITY)
Admission: RE | Admit: 2016-06-05 | Discharge: 2016-06-05 | Disposition: A | Discharge status: Home / Self Care | Payer: Medicare Other | Source: Ambulatory Visit | Attending: Cardiology | Admitting: Cardiology

## 2016-06-05 ENCOUNTER — Encounter: Payer: Self-pay | Admitting: Cardiology

## 2016-06-05 VITALS — BP 110/60 | HR 65 | Ht 70.0 in | Wt 224.8 lb

## 2016-06-05 DIAGNOSIS — Z9889 Other specified postprocedural states: Secondary | ICD-10-CM | POA: Diagnosis not present

## 2016-06-05 DIAGNOSIS — I5032 Chronic diastolic (congestive) heart failure: Secondary | ICD-10-CM

## 2016-06-05 DIAGNOSIS — E7800 Pure hypercholesterolemia, unspecified: Secondary | ICD-10-CM | POA: Diagnosis not present

## 2016-06-05 DIAGNOSIS — R0602 Shortness of breath: Secondary | ICD-10-CM

## 2016-06-05 DIAGNOSIS — I10 Essential (primary) hypertension: Secondary | ICD-10-CM

## 2016-06-05 DIAGNOSIS — M9903 Segmental and somatic dysfunction of lumbar region: Secondary | ICD-10-CM | POA: Diagnosis not present

## 2016-06-05 DIAGNOSIS — M5386 Other specified dorsopathies, lumbar region: Secondary | ICD-10-CM | POA: Diagnosis not present

## 2016-06-05 DIAGNOSIS — M9902 Segmental and somatic dysfunction of thoracic region: Secondary | ICD-10-CM | POA: Diagnosis not present

## 2016-06-05 DIAGNOSIS — I25118 Atherosclerotic heart disease of native coronary artery with other forms of angina pectoris: Secondary | ICD-10-CM

## 2016-06-05 DIAGNOSIS — M5137 Other intervertebral disc degeneration, lumbosacral region: Secondary | ICD-10-CM | POA: Diagnosis not present

## 2016-06-05 DIAGNOSIS — M5414 Radiculopathy, thoracic region: Secondary | ICD-10-CM | POA: Diagnosis not present

## 2016-06-05 DIAGNOSIS — M9904 Segmental and somatic dysfunction of sacral region: Secondary | ICD-10-CM | POA: Diagnosis not present

## 2016-06-05 DIAGNOSIS — Z79899 Other long term (current) drug therapy: Secondary | ICD-10-CM | POA: Diagnosis not present

## 2016-06-05 LAB — BASIC METABOLIC PANEL
BUN: 19 mg/dL (ref 7–25)
CHLORIDE: 102 mmol/L (ref 98–110)
CO2: 28 mmol/L (ref 20–31)
CREATININE: 1.66 mg/dL — AB (ref 0.70–1.18)
Calcium: 9.2 mg/dL (ref 8.6–10.3)
Glucose, Bld: 125 mg/dL — ABNORMAL HIGH (ref 65–99)
Potassium: 3.6 mmol/L (ref 3.5–5.3)
Sodium: 141 mmol/L (ref 135–146)

## 2016-06-05 MED ORDER — PRAVASTATIN SODIUM 40 MG PO TABS: 40.0000 mg | ORAL_TABLET | Freq: Every evening | ORAL | 3 refills | Status: DC

## 2016-06-05 NOTE — Patient Instructions (Signed)
Medication Instructions:  Please start Pravastatin 40 mg a day. You may stop your Warfarin in 1 month or when your surgeon says it's OK to discontinue. Continue all other medications as listed.  Labwork: Please have blood work today (BNP, BMP). Have fasting blood work in 2 months (Lipid,hepatic panel)  Follow-Up: Please follow up with Dr [facility]und[facility]in [facility].  Thank you for choosing [facility]!!

## 2016-06-07 NOTE — Progress Notes (Signed)
Patient ID: [name], male   DOB: February 07, 1946, 70 y.o.   MRN: [id number] PCP: Dr Harrington Challenger  70 yo with history of CAD s/p CABG and obesity s/p lap banding procedure presents for cardiology followup.  Given exertional dyspnea and chest tightness, he had LHC in 1/15.  This showed no change from prior.  Grafts were patent and there was severe stenosis in distal D1, not amenable to PCI.   This year, he developed increased exertional dyspnea.  Eventually had a TEE showing severe MR with prolapse of a portion of the anterior mitral valve leaflet.  Coronary angiography in 6/17 showed patent grafts and 90% stenosis of distal diagonal not amenable to intervention.  In 8/17, he had mitral valve repair.  Post-op diastolic CHF, now on Lasix 80 qam/40 qpm.   Gradually improving, less exertional dyspnea. Short of breath only with heavy exertion though he fatigues easily.  He developed myalgias and stopped Crestor.  Myalgias resolved.   ECG: NSR, normal  Labs (11/12): LDL 70 Labs (5/13): K 4.1, creatinine 1.1, BNP 48 Labs (12/13): K 3.4, creatinine 1.07, LDL 59, HDL 37 Labs (1/15): K 4.3, creatinine 1.2, HCT 40.3 Labs (3/15): LDL 38, HDL 81, BNP 55, K 4, creatinine 1.2 Labs (6/15): K 4.2, creatinine 1.4, LFTs normal, LDL 66, HDL 35 Labs (8/16): K 4, creatinine 1.6, BNP 64 Labs (12/16): LDL 61, HDL 37 Labs (4/17): K 4.2, creatinine 1.35, HCT 38.7, BNP 80 Labs (8/17): K 4.2, creatinine 1.4 => 1.66, BNP 159  PMH: 1. OSA: on CPAP.  2. Diabetes mellitus 3. Allergic rhinitis 4. CAD: s/p CABG 1995.  LHC (10/11) with 90% distal D1 (patent proximal D1 stent), total occlusion CFX, total occlusion RCA, no significant stenoses in LAD, SVG-PDA patent, SVG-OM patent, EF 55%.  Medical management.  LHC (5/13) with patent grafts and 90% distal D1 stenosis (no significant change from 10/11).  LHC (1/15) with patent grafts, 75% ISR in D1 stent, 90% stenosis distal D1 (small caliber). D1 not amenable to intervention.  -  Coronary angiography (6/17): grafts patent, 90% stenosis distally in large diagonal not amenable to intervention.   5. Obesity: lap-banding in 5/11.  6. Chronic diastolic CHF: Echo (7/82) with EF 55-60%, moderate LVH, moderate diastolic dysfunction, s/p mitral valve repair with elevated mean gradient but normal pressure half-time.  7. TIA: Carotid dopplers in 11/11 with 0-39% bilateral stenosis.  Carotid dopplers 6/17 with 1-39% RICA stenosis.  8. OA: Knees, C-spine. TKR 9/13.  9. HTN 10. Hyperlipidemia: Myalgias with atorvastatin and Crestor.  11. Diverticulosis 12. MItral regurgitation: TEE (6/17) with EF 55-60%, severe eccentric MR, prolapse of a portion of the anterior leaflet, peak RV-RA gradient 45 mmHg.  - Mitral valve repair 8/17 13. CKD: Stage III.   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  Lives in [place].   Family History  Problem Relation Age of Onset  . Other Mother     joint problems  . Hypertension Father   . Heart disease Father   . CVA Father   . Depression Father   . Heart disease Sister     CABG  . Stroke Sister   . Hypertension Sister   . Congestive Heart Failure Sister   . Diabetes Sister   . Heart disease Brother   . Hypertension Brother   . Congestive Heart Failure Brother     ROS: All systems reviewed and negative except as per HPI.    Current Outpatient Prescriptions  Medication Sig  Dispense Refill  . amoxicillin (AMOXIL) 500 MG tablet Take 4 tablets (2 g) by mouth 30-60 minutes prior to dental appointment 12 tablet 0  . aspirin EC 81 MG tablet Take 81 mg by mouth daily.    . carvedilol (COREG) 3.125 MG tablet Take 1 tablet (3.125 mg total) by mouth 2 (two) times daily. 60 tablet 3  . cetirizine (ZYRTEC) 10 MG chewable tablet Chew 10 mg by mouth daily.    . fluticasone (FLONASE) 50 MCG/ACT nasal spray Place 1 spray into both nostrils daily as needed for allergies or rhinitis.    . furosemide (LASIX) 40 MG tablet Take 40 mg by  mouth as directed. Take 80 mg in the morning and 40 mg in the afternoon    . HYDROcodone-acetaminophen (NORCO) 10-325 MG tablet Take 1 tablet by mouth every 4 (four) hours as needed for moderate pain. 20 tablet 0  . metFORMIN (GLUCOPHAGE) 500 MG tablet Take 500 mg by mouth 2 (two) times daily with a meal.    . nitroGLYCERIN (NITROSTAT) 0.4 MG SL tablet Place 1 tablet (0.4 mg total) under the tongue every 5 (five) minutes as needed for chest pain. 25 tablet 3  . potassium chloride SA (K-DUR,KLOR-CON) 20 MEQ tablet Take 20 mEq by mouth 2 (two) times daily.    . tadalafil (CIALIS) 20 MG tablet Take 20 mg by mouth daily as needed for erectile dysfunction.    . traZODone (DESYREL) 100 MG tablet Take 100 mg by mouth at bedtime as needed for sleep.    . VENTOLIN HFA 108 (90 Base) MCG/ACT inhaler Inhale 1 puff into the lungs every 4 (four) hours as needed for wheezing or shortness of breath.     . warfarin (COUMADIN) 5 MG tablet Take 1 tablet (5 mg total) by mouth daily at 6 PM. (Patient taking differently: Take 5 mg by mouth daily at 6 PM. Pt takes 1.5 tabs (7.5mg  total) MWF, 1 tab (5mg  total) all other days) 30 tablet 3  . pravastatin (PRAVACHOL) 40 MG tablet Take 1 tablet (40 mg total) by mouth every evening. 90 tablet 3   No current facility-administered medications for this visit.     BP 110/60   Pulse 65   Ht 5\' 10"  (1.778 m)   Wt 224 lb 12.8 oz (102 kg)   SpO2 97%   BMI 32.26 kg/m  General: NAD Neck: Thick, JVP 7 cm, no thyromegaly or thyroid nodule.  Lungs: Clear to auscultation bilaterally with normal respiratory effort. CV: Nondisplaced PMI.  Heart regular S1/S2, no S3/S4, no murmur.  No edema.  No carotid bruit.  Normal pedal pulses.  Abdomen: Soft, nontender, no hepatosplenomegaly, no distention.  Neurologic: Alert and oriented x 3.  Psych: Normal affect. Extremities: No clubbing or cyanosis.   Assessment/Plan: 1. S/p mitral valve repair: Doing well so far. Post-op echo in 8/17  showed elevated mean gradient across the valve but normal pressure half-time (?high flow).  - Can stop warfarin 3 months post-op in 11/17.  Will then continue ASA 81.    2. CAD: Most recent cardiac cath showed stable coronary anatomy.  He has severe stenosis in a distal D1 that is not amenable to intervention.  - Continue ASA 81,Coreg.   3. Hyperlipidemia: Myalgias with Crestor and atorvastatin.  I will start him on pravastatin 40 mg daily with lipids/LFTs in 2 months.  If he cannot tolerate this, will need Repatha.    4. Chronic diastolic CHF: Volume status looks good today.  LVH and diastolic dysfunction on last echo.  - Continue Lasix 80 qam/40 qpm.  BMET/BNP today.   5. OSA: Continue CPAP.   Followup in 4 months.   [name] 06/07/2016

## 2016-06-08 ENCOUNTER — Encounter (HOSPITAL_COMMUNITY)
Admission: RE | Admit: 2016-06-08 | Discharge: 2016-06-08 | Disposition: A | Discharge status: Home / Self Care | Payer: Medicare Other | Source: Ambulatory Visit | Attending: Cardiology | Admitting: Cardiology

## 2016-06-08 DIAGNOSIS — M5137 Other intervertebral disc degeneration, lumbosacral region: Secondary | ICD-10-CM | POA: Diagnosis not present

## 2016-06-08 DIAGNOSIS — Z9889 Other specified postprocedural states: Secondary | ICD-10-CM | POA: Diagnosis not present

## 2016-06-08 DIAGNOSIS — M9902 Segmental and somatic dysfunction of thoracic region: Secondary | ICD-10-CM | POA: Diagnosis not present

## 2016-06-08 DIAGNOSIS — M9904 Segmental and somatic dysfunction of sacral region: Secondary | ICD-10-CM | POA: Diagnosis not present

## 2016-06-08 DIAGNOSIS — M9903 Segmental and somatic dysfunction of lumbar region: Secondary | ICD-10-CM | POA: Diagnosis not present

## 2016-06-08 DIAGNOSIS — M5414 Radiculopathy, thoracic region: Secondary | ICD-10-CM | POA: Diagnosis not present

## 2016-06-08 DIAGNOSIS — M5386 Other specified dorsopathies, lumbar region: Secondary | ICD-10-CM | POA: Diagnosis not present

## 2016-06-10 ENCOUNTER — Encounter (HOSPITAL_COMMUNITY)
Admission: RE | Admit: 2016-06-10 | Discharge: 2016-06-10 | Disposition: A | Discharge status: Home / Self Care | Payer: Medicare Other | Source: Ambulatory Visit | Attending: Cardiology | Admitting: Cardiology

## 2016-06-10 DIAGNOSIS — Z9889 Other specified postprocedural states: Secondary | ICD-10-CM | POA: Diagnosis not present

## 2016-06-11 DIAGNOSIS — M5414 Radiculopathy, thoracic region: Secondary | ICD-10-CM | POA: Diagnosis not present

## 2016-06-11 DIAGNOSIS — M5386 Other specified dorsopathies, lumbar region: Secondary | ICD-10-CM | POA: Diagnosis not present

## 2016-06-11 DIAGNOSIS — M9904 Segmental and somatic dysfunction of sacral region: Secondary | ICD-10-CM | POA: Diagnosis not present

## 2016-06-11 DIAGNOSIS — M5137 Other intervertebral disc degeneration, lumbosacral region: Secondary | ICD-10-CM | POA: Diagnosis not present

## 2016-06-11 DIAGNOSIS — M9903 Segmental and somatic dysfunction of lumbar region: Secondary | ICD-10-CM | POA: Diagnosis not present

## 2016-06-11 DIAGNOSIS — M9902 Segmental and somatic dysfunction of thoracic region: Secondary | ICD-10-CM | POA: Diagnosis not present

## 2016-06-12 ENCOUNTER — Encounter (HOSPITAL_COMMUNITY): Payer: Medicare Other

## 2016-06-12 DIAGNOSIS — M75101 Unspecified rotator cuff tear or rupture of right shoulder, not specified as traumatic: Secondary | ICD-10-CM | POA: Diagnosis not present

## 2016-06-12 DIAGNOSIS — S4981XA Other specified injuries of right shoulder and upper arm, initial encounter: Secondary | ICD-10-CM | POA: Diagnosis not present

## 2016-06-12 DIAGNOSIS — M25511 Pain in right shoulder: Secondary | ICD-10-CM | POA: Diagnosis not present

## 2016-06-15 ENCOUNTER — Encounter (HOSPITAL_COMMUNITY): Payer: Medicare Other

## 2016-06-15 ENCOUNTER — Encounter: Payer: Medicare Other | Admitting: Thoracic Surgery (Cardiothoracic Vascular Surgery)

## 2016-06-15 ENCOUNTER — Telehealth (HOSPITAL_COMMUNITY): Payer: Self-pay | Admitting: Family Medicine

## 2016-06-15 DIAGNOSIS — M75101 Unspecified rotator cuff tear or rupture of right shoulder, not specified as traumatic: Secondary | ICD-10-CM | POA: Diagnosis not present

## 2016-06-15 DIAGNOSIS — S4981XA Other specified injuries of right shoulder and upper arm, initial encounter: Secondary | ICD-10-CM | POA: Diagnosis not present

## 2016-06-15 DIAGNOSIS — S5011XA Contusion of right forearm, initial encounter: Secondary | ICD-10-CM | POA: Diagnosis not present

## 2016-06-17 ENCOUNTER — Encounter (HOSPITAL_COMMUNITY): Payer: Medicare Other

## 2016-06-19 ENCOUNTER — Encounter (HOSPITAL_COMMUNITY): Payer: Medicare Other

## 2016-06-22 ENCOUNTER — Ambulatory Visit (INDEPENDENT_AMBULATORY_CARE_PROVIDER_SITE_OTHER): Payer: Self-pay | Admitting: Thoracic Surgery (Cardiothoracic Vascular Surgery)

## 2016-06-22 ENCOUNTER — Encounter (HOSPITAL_COMMUNITY): Payer: Medicare Other

## 2016-06-22 ENCOUNTER — Encounter: Payer: Self-pay | Admitting: Thoracic Surgery (Cardiothoracic Vascular Surgery)

## 2016-06-22 VITALS — BP 123/83 | HR 76 | Resp 16 | Ht 70.0 in | Wt 221.0 lb

## 2016-06-22 DIAGNOSIS — Z9889 Other specified postprocedural states: Secondary | ICD-10-CM
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
## 2016-06-22 NOTE — Addendum Note (Signed)
Addended by: [name] on: 06/22/2016 01:04 PM   Modules accepted: Orders

## 2016-06-22 NOTE — Patient Instructions (Signed)
You may stop taking Coumadin (warfarin)  Continue all other previous medications without any changes at this time  You may resume unrestricted physical activity without any particular limitations at this time.  Endocarditis is a potentially serious infection of heart valves or inside lining of the heart.  It occurs more commonly in patients with diseased heart valves (such as patient's with aortic or mitral valve disease) and in patients who have undergone heart valve repair or replacement.  Certain surgical and dental procedures may put you at risk, such as dental cleaning, other dental procedures, or any surgery involving the respiratory, urinary, gastrointestinal tract, gallbladder or prostate gland.   To minimize your chances for develooping endocarditis, maintain good oral health and seek prompt medical attention for any infections involving the mouth, teeth, gums, skin or urinary tract.    Always notify your doctor or dentist about your underlying heart valve condition before having any invasive procedures. You will need to take antibiotics before certain procedures, including all routine dental cleanings or other dental procedures.  Your cardiologist or dentist should prescribe these antibiotics for you to be taken ahead of time.

## 2016-06-24 ENCOUNTER — Encounter (HOSPITAL_COMMUNITY): Payer: Medicare Other

## 2016-06-25 DIAGNOSIS — M75101 Unspecified rotator cuff tear or rupture of right shoulder, not specified as traumatic: Secondary | ICD-10-CM | POA: Diagnosis not present

## 2016-06-25 DIAGNOSIS — J029 Acute pharyngitis, unspecified: Secondary | ICD-10-CM | POA: Diagnosis not present

## 2016-06-26 ENCOUNTER — Encounter (HOSPITAL_COMMUNITY): Payer: Medicare Other

## 2016-06-29 ENCOUNTER — Encounter (HOSPITAL_COMMUNITY)
Admission: RE | Admit: 2016-06-29 | Discharge: 2016-06-29 | Disposition: A | Discharge status: Home / Self Care | Payer: Medicare Other | Source: Ambulatory Visit | Attending: Cardiology | Admitting: Cardiology

## 2016-06-29 DIAGNOSIS — Z9889 Other specified postprocedural states: Secondary | ICD-10-CM | POA: Insufficient documentation

## 2016-07-01 ENCOUNTER — Encounter (HOSPITAL_COMMUNITY)
Admission: RE | Admit: 2016-07-01 | Discharge: 2016-07-01 | Disposition: A | Discharge status: Home / Self Care | Payer: Medicare Other | Source: Ambulatory Visit | Attending: Cardiology | Admitting: Cardiology

## 2016-07-01 DIAGNOSIS — M75101 Unspecified rotator cuff tear or rupture of right shoulder, not specified as traumatic: Secondary | ICD-10-CM | POA: Diagnosis not present

## 2016-07-01 DIAGNOSIS — Z9889 Other specified postprocedural states: Secondary | ICD-10-CM | POA: Diagnosis not present

## 2016-07-01 NOTE — Progress Notes (Signed)
Cardiac Individual Treatment Plan  Patient Details  Name: [name] MRN: [id number] Date of Birth: 04/17/1946 Referring Provider:   Flowsheet Row CARDIAC REHAB PHASE II ORIENTATION from 05/07/2016 in [place]  Referring Provider  Aundra Dubin, [place],MD      Initial Encounter Date:  [place] PHASE II ORIENTATION from 05/07/2016 in [place]  Date  05/07/16  Referring Provider  Aundra Dubin, [place],MD      Visit Diagnosis: 03/25/16 S/P MVR (mitral valve repair)  Patient's Home Medications on Admission:  Current Outpatient Prescriptions:  .  amoxicillin (AMOXIL) 500 MG tablet, Take 4 tablets (2 g) by mouth 30-60 minutes prior to dental appointment, Disp: 12 tablet, Rfl: 0 .  aspirin EC 81 MG tablet, Take 81 mg by mouth daily., Disp: , Rfl:  .  carvedilol (COREG) 3.125 MG tablet, Take 1 tablet (3.125 mg total) by mouth 2 (two) times daily., Disp: 60 tablet, Rfl: 3 .  cetirizine (ZYRTEC) 10 MG chewable tablet, Chew 10 mg by mouth daily., Disp: , Rfl:  .  fluticasone (FLONASE) 50 MCG/ACT nasal spray, Place 1 spray into both nostrils daily as needed for allergies or rhinitis., Disp: , Rfl:  .  furosemide (LASIX) 40 MG tablet, Take 40 mg by mouth as directed. Take 80 mg in the morning and 40 mg in the afternoon, Disp: , Rfl:  .  HYDROcodone-acetaminophen (NORCO) 10-325 MG tablet, Take 1 tablet by mouth every 4 (four) hours as needed for moderate pain., Disp: 20 tablet, Rfl: 0 .  metFORMIN (GLUCOPHAGE) 500 MG tablet, Take 500 mg by mouth 2 (two) times daily with a meal., Disp: , Rfl:  .  nitroGLYCERIN (NITROSTAT) 0.4 MG SL tablet, Place 1 tablet (0.4 mg total) under the tongue every 5 (five) minutes as needed for chest pain., Disp: 25 tablet, Rfl: 3 .  potassium chloride SA (K-DUR,KLOR-CON) 20 MEQ tablet, Take 20 mEq by mouth 2 (two) times daily., Disp: , Rfl:  .  pravastatin (PRAVACHOL) 40 MG tablet, Take 1 tablet (40  mg total) by mouth every evening., Disp: 90 tablet, Rfl: 3 .  tadalafil (CIALIS) 20 MG tablet, Take 20 mg by mouth daily as needed for erectile dysfunction., Disp: , Rfl:  .  traZODone (DESYREL) 100 MG tablet, Take 100 mg by mouth at bedtime as needed for sleep., Disp: , Rfl:  .  VENTOLIN HFA 108 (90 Base) MCG/ACT inhaler, Inhale 1 puff into the lungs every 4 (four) hours as needed for wheezing or shortness of breath. , Disp: , Rfl:   Past Medical History: Past Medical History:  Diagnosis Date  . Allergic rhinitis   . Anxiety   . Cancer ([place])    hx of skin cancers   . Coronary artery disease    a. s/p MI & CABG; b. s/p PCI Diag;  c. Cath 12/2011: LAD diff dzs/small, Diag 75% isr, 90 dist to stent (small), LCX & RCA occluded, VG->OM 40, VG->PDA patent - med rx.  . Depression   . Diabetes mellitus    ORAL MEDS  . Diastolic CHF, chronic (HCC)    a. EF 55-60% by echo 2007  . Dysrhythmia    "abnormal beat"  . GERD (gastroesophageal reflux disease)    "not anymore" " I had a lap band"  . History of diverticulitis of colon    5 YRS AGO  . History of pneumonia    2017  . History of transient ischemic attack (TIA)  CAROTID DOPPLERS NOV 2011  0-39& BIL. STENOSIS  . Hyperlipidemia   . Hypertension   . Mitral regurgitation   . Myocardial infarction   . Neuromuscular disorder ([place])    left arm numbness   . 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 PTSD (post-traumatic stress disorder)    from [place]  . 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 Shortness of breath dyspnea    with exertion  . Sleep apnea    cpap- see ov note in [facility] 05/12/11 for settings     Tobacco Use: History  Smoking Status  . Never Smoker  Smokeless Tobacco  . Never  Used    Labs: Recent Review Flowsheet Data    Labs for ITP Cardiac and Pulmonary Rehab Latest Ref Rng & Units 03/25/2016 03/25/2016 03/25/2016 03/25/2016 03/26/2016   Cholestrol 125 - 200 mg/dL - - - - -   LDLCALC <130 mg/dL - - - - -   HDL >=40 mg/dL - - - - -   Trlycerides <150 mg/dL - - - - -   Hemoglobin A1c 4.8 - 5.6 % - - - - -   PHART 7.350 - 7.450 7.314(L) 7.300(L) 7.317(L) - -   PCO2ART 35.0 - 45.0 mmHg 42.8 40.5 41.5 - -   HCO3 20.0 - 24.0 mEq/L 22.0 20.0 21.1 - -   TCO2 0 - 100 mmol/L 23 21 22 23 22    ACIDBASEDEF 0.0 - 2.0 mmol/L 4.0(H) 6.0(H) 5.0(H) - -   O2SAT % 94.0 98.0 95.0 - -      Capillary Blood Glucose: Lab Results  Component Value Date   GLUCAP 145 (H) 05/15/2016   GLUCAP 147 (H) 05/13/2016   GLUCAP 141 (H) 05/13/2016   GLUCAP 148 (H) 05/11/2016   GLUCAP 176 (H) 05/11/2016     Exercise Target Goals:    Exercise Program Goal: Individual exercise prescription set with THRR, safety & activity barriers. Participant demonstrates ability to understand and report RPE using BORG scale, to self-measure pulse accurately, and to acknowledge the importance of the exercise prescription.  Exercise Prescription Goal: Starting with aerobic activity 30 plus minutes a day, 3 days per week for initial exercise prescription. Provide home exercise prescription and guidelines that participant acknowledges understanding prior to discharge.  Activity Barriers & Risk Stratification:     Activity Barriers & Cardiac Risk Stratification - 05/07/16 0914      Activity Barriers & Cardiac Risk Stratification   Activity Barriers Right Hip Replacement;Other (comment)   Comments L ankle pain   Cardiac Risk Stratification High      6 Minute Walk:     6 Minute Walk    Row Name 05/07/16 1533         6 Minute Walk   Phase Initial     Distance 1653 feet     Walk Time 6 minutes     # of Rest Breaks 0     MPH 3.13     METS 3.1     RPE 11     VO2 Peak 10.69     Symptoms No      Resting HR 80 bpm     Resting BP 120/76     Max Ex. HR 85 bpm     Max Ex.  BP 132/76     2 Minute Post BP 108/64        Initial Exercise Prescription:     Initial Exercise Prescription - 05/07/16 1500      Date of Initial Exercise RX and Referring Provider   Date 05/07/16   Referring Provider Aundra Dubin, [place],MD     Treadmill   MPH 2.6   Grade 0   Minutes 10   METs 2.99     NuStep   Level 3   Minutes 10   METs 2     Cybex   Level 2   Minutes 10   METs 2     Prescription Details   Frequency (times per week) 3   Duration Progress to 30 minutes of continuous aerobic without signs/symptoms of physical distress     Intensity   THRR 40-80% of Max Heartrate 60-120   Ratings of Perceived Exertion 11-13   Perceived Dyspnea 0-4     Progression   Progression Continue to progress workloads to maintain intensity without signs/symptoms of physical distress.     Resistance Training   Training Prescription Yes   Weight 2lbs   Reps 10-12      Perform Capillary Blood Glucose checks as needed.  Exercise Prescription Changes:     Exercise Prescription Changes    Row Name 05/29/16 1000 06/10/16 1700 06/29/16 1700         Exercise Review   Progression Yes Yes Yes       Response to Exercise   Blood Pressure (Admit) 104/62 114/74 110/78     Blood Pressure (Exercise) 124/68 102/66 130/70     Blood Pressure (Exit) 98/60 100/62 100/70     Heart Rate (Admit) 79 bpm 91 bpm 95 bpm     Heart Rate (Exercise) 114 bpm 113 bpm 120 bpm     Heart Rate (Exit) 80 bpm 78 bpm 95 bpm     Rating of Perceived Exertion (Exercise) 11 11 12      Comments Reviewed home exercise guidelines on 05/18/16. Reviewed home exercise guidelines on 05/18/16. Reviewed home exercise guidelines on 05/18/16.     Duration Progress to 30 minutes of continuous aerobic without signs/symptoms of physical distress Progress to 30 minutes of continuous aerobic without signs/symptoms of physical distress Progress to 30  minutes of continuous aerobic without signs/symptoms of physical distress     Intensity THRR unchanged THRR unchanged THRR unchanged       Progression   Progression Continue to progress workloads to maintain intensity without signs/symptoms of physical distress. Continue to progress workloads to maintain intensity without signs/symptoms of physical distress. Continue to progress workloads to maintain intensity without signs/symptoms of physical distress.     Average METs 4.3 4.8 5.22       Resistance Training   Training Prescription Yes Yes Yes     Weight 5lbs 5lbs 3lbs     Reps 10-12 10-12 10-12       Treadmill   MPH 3 3 3.2     Grade 3 3 4      Minutes 10 10 10      METs 4.54 4.54 5.21       NuStep   Level 4 4 5      Minutes 10 10 10      METs 4.1 3 3.5       Cybex   Level 3.2 3.2 3.2     Minutes 10 10 10      METs  - 6.91 6.95  Home Exercise Plan   Plans to continue exercise at [place] 3 additional days to program exercise sessions. Add 3 additional days to program exercise sessions. Add 3 additional days to program exercise sessions.        Exercise Comments:     Exercise Comments    Row Name 05/18/16 1113 05/27/16 1016 06/10/16 1713 06/29/16 1710     Exercise Comments Reviewed home exercise guidelines with patient. Currently walking 3-4 days at home in addition to cardiac rehab. Participant walking 1 hour, 3-4 days per week. Pt does a stretch routine 2xs/day, walks 3 miles, 3 days/week Pt does a stretch routine 2xs/day, walks 3 miles, 3 days/week, continues to progress with exercise        Discharge Exercise Prescription (Final Exercise Prescription Changes):     Exercise Prescription Changes - 06/29/16 1700      Exercise Review   Progression Yes     Response to Exercise   Blood Pressure (Admit) 110/78   Blood Pressure (Exercise) 130/70   Blood Pressure (Exit) 100/70   Heart Rate (Admit) 95 bpm   Heart Rate (Exercise) 120 bpm    Heart Rate (Exit) 95 bpm   Rating of Perceived Exertion (Exercise) 12   Comments Reviewed home exercise guidelines on 05/18/16.   Duration Progress to 30 minutes of continuous aerobic without signs/symptoms of physical distress   Intensity THRR unchanged     Progression   Progression Continue to progress workloads to maintain intensity without signs/symptoms of physical distress.   Average METs 5.22     Resistance Training   Training Prescription Yes   Weight 3lbs   Reps 10-12     Treadmill   MPH 3.2   Grade 4   Minutes 10   METs 5.21     NuStep   Level 5   Minutes 10   METs 3.5     Cybex   Level 3.2   Minutes 10   METs 6.95     Home Exercise Plan   Plans to continue exercise at Home   Frequency Add 3 additional days to program exercise sessions.      Nutrition:  Target Goals: Understanding of nutrition guidelines, daily intake of sodium 1500mg , cholesterol 200mg , calories 30% from fat and 7% or less from saturated fats, daily to have 5 or more servings of fruits and vegetables.  Biometrics:     Pre Biometrics - 05/07/16 1543      Pre Biometrics   Waist Circumference 45 inches   Hip Circumference 41 inches   Waist to Hip Ratio 1.1 %   Triceps Skinfold 9 mm   % Body Fat 30 %   Grip Strength 44 kg   Flexibility 10 in   Single Leg Stand 8.25 seconds       Nutrition Therapy Plan and Nutrition Goals:     Nutrition Therapy & Goals - 05/11/16 0819      Nutrition Therapy   Diet Carb Modified, Therapeutic Lifestyle Changes   Drug/Food Interactions Coumadin/Vit K     Personal Nutrition Goals   Personal Goal #1 1-2 lb wt loss/week to a wt loss goal of 6-24 lb at graduation from Cardiac Rehab   Personal Goal #2 Improved glycemic control as evidenced by an A1c of 7 or less     Intervention Plan   Intervention Prescribe, educate and counsel regarding individualized specific dietary modifications aiming towards targeted core components such as weight,  hypertension, lipid management, diabetes, heart failure and other comorbidities.   Expected Outcomes Short Term Goal: Understand basic principles of dietary content, such as calories, fat, sodium, cholesterol and nutrients.;Long Term Goal: Adherence to prescribed nutrition plan.      Nutrition Discharge: Nutrition Scores:     Nutrition Assessments - 05/22/16 1332      MEDFICTS Scores   Pre Score 89      Nutrition Goals Re-Evaluation:   Psychosocial: Target Goals: Acknowledge presence or absence of depression, maximize coping skills, provide positive support system. Participant is able to verbalize types and ability to use techniques and skills needed for reducing stress and depression.  Initial Review & Psychosocial Screening:     Initial Psych Review & Screening - 05/07/16 1628      Family Dynamics   Good Support System? Yes   Comments Pt wife accompanied him for his orientation appt with cardiac rehab.     Barriers   Psychosocial barriers to participate in program There are no identifiable barriers or psychosocial needs.      Quality of Life Scores:     Quality of Life - 05/07/16 1545      Quality of Life Scores   Health/Function Pre 22 %   Socioeconomic Pre 29 %   Psych/Spiritual Pre 30 %   Family Pre 27.6 %   GLOBAL Pre 25.82 %      PHQ-9: Recent Review Flowsheet Data    Depression screen [facility] 2/9 05/11/2016 02/14/2014   Decreased Interest 0 0   Down, Depressed, Hopeless 0 0   PHQ - 2 Score 0 0      Psychosocial Evaluation and Intervention:   Psychosocial Re-Evaluation:     Psychosocial Re-Evaluation    Row Name 06/04/16 1604 07/01/16 1825           Psychosocial Re-Evaluation   Interventions Encouraged to attend Cardiac Rehabilitation for the exercise Encouraged to attend Cardiac Rehabilitation for the exercise      Continued Psychosocial Services Needed No No         Vocational Rehabilitation: Provide vocational rehab assistance to  qualifying candidates.   Vocational Rehab Evaluation & Intervention:     Vocational Rehab - 05/07/16 1629      Initial Vocational Rehab Evaluation & Intervention   Assessment shows need for Vocational Rehabilitation No     Discharge Vocational Rehab   Discharge Vocational Rehabilitation Pt does not plan to return to competive employment.      Education: Education Goals: Education classes will be provided on a weekly basis, covering required topics. Participant will state understanding/return demonstration of topics presented.  Learning Barriers/Preferences:     Learning Barriers/Preferences - 05/07/16 0915      Learning Barriers/Preferences   Learning Barriers Sight   Learning Preferences Skilled Demonstration;Video;Written Material;Verbal Instruction;Individual Instruction;Pictoral;Group Instruction;Computer/Internet;Audio      Education Topics: Count Your Pulse:  -Group instruction provided by verbal instruction, demonstration, patient participation and written materials to support subject.  Instructors address importance of being able to find your pulse and how to count your pulse when at home without a heart monitor.  Patients get hands on experience counting their pulse with staff help and individually.   Heart Attack, Angina, and Risk Factor Modification:  -Group instruction provided by verbal instruction, video, and written materials to support subject.  Instructors address signs and symptoms of angina and heart attacks.    Also discuss risk factors for heart disease and how to make changes to improve heart health  risk factors.   Functional Fitness:  -Group instruction provided by verbal instruction, demonstration, patient participation, and written materials to support subject.  Instructors address safety measures for doing things around the house.  Discuss how to get up and down off the floor, how to pick things up properly, how to safely get out of a chair without  assistance, and balance training.   Meditation and Mindfulness:  -Group instruction provided by verbal instruction, patient participation, and written materials to support subject.  Instructor addresses importance of mindfulness and meditation practice to help reduce stress and improve awareness.  Instructor also leads participants through a meditation exercise.    Stretching for Flexibility and Mobility:  -Group instruction provided by verbal instruction, patient participation, and written materials to support subject.  Instructors lead participants through series of stretches that are designed to increase flexibility thus improving mobility.  These stretches are additional exercise for major muscle groups that are typically performed during regular warm up and cool down.   Hands Only CPR Anytime:  -Group instruction provided by verbal instruction, video, patient participation and written materials to support subject.  Instructors co-teach with AHA video for hands only CPR.  Participants get hands on experience with mannequins.   Nutrition I class: Heart Healthy Eating:  -Group instruction provided by PowerPoint slides, verbal discussion, and written materials to support subject matter. The instructor gives an explanation and review of the Therapeutic Lifestyle Changes diet recommendations, which includes a discussion on lipid goals, dietary fat, sodium, fiber, plant stanol/sterol esters, sugar, and the components of a well-balanced, healthy diet. Flowsheet Row CARDIAC REHAB PHASE II EXERCISE from 05/22/2016 in [place]  Date  05/22/16  Educator  RD  Instruction Review Code  Not applicable [class handouts given]      Nutrition II class: Lifestyle Skills:  -Group instruction provided by PowerPoint slides, verbal discussion, and written materials to support subject matter. The instructor gives an explanation and review of label reading, grocery shopping for  heart health, heart healthy recipe modifications, and ways to make healthier choices when eating out. Flowsheet Row CARDIAC REHAB PHASE II EXERCISE from 05/22/2016 in [place]  Date  05/22/16  Educator  RD  Instruction Review Code  Not applicable [class handouts given]      Diabetes Question & Answer:  -Group instruction provided by PowerPoint slides, verbal discussion, and written materials to support subject matter. The instructor gives an explanation and review of diabetes co-morbidities, pre- and post-prandial blood glucose goals, pre-exercise blood glucose goals, signs, symptoms, and treatment of hypoglycemia and hyperglycemia, and foot care basics.   Diabetes Blitz:  -Group instruction provided by PowerPoint slides, verbal discussion, and written materials to support subject matter. The instructor gives an explanation and review of the physiology behind type 1 and type 2 diabetes, diabetes medications and rational behind using different medications, pre- and post-prandial blood glucose recommendations and Hemoglobin A1c goals, diabetes diet, and exercise including blood glucose guidelines for exercising safely.  Flowsheet Row CARDIAC REHAB PHASE II EXERCISE from 05/22/2016 in [place]  Date  05/22/16  Educator  RD  Instruction Review Code  Not applicable [class handouts given]      Portion Distortion:  -Group instruction provided by PowerPoint slides, verbal discussion, written materials, and food models to support subject matter. The instructor gives an explanation of serving size versus portion size, changes in portions sizes over the last 20 years, and what consists  of a serving from each food group.   Stress Management:  -Group instruction provided by verbal instruction, video, and written materials to support subject matter.  Instructors review role of stress in heart disease and how to cope with stress positively.      Exercising on Your Own:  -Group instruction provided by verbal instruction, power point, and written materials to support subject.  Instructors discuss benefits of exercise, components of exercise, frequency and intensity of exercise, and end points for exercise.  Also discuss use of nitroglycerin and activating EMS.  Review options of places to exercise outside of rehab.  Review guidelines for sex with heart disease.   Cardiac Drugs I:  -Group instruction provided by verbal instruction and written materials to support subject.  Instructor reviews cardiac drug classes: antiplatelets, anticoagulants, beta blockers, and statins.  Instructor discusses reasons, side effects, and lifestyle considerations for each drug class.   Cardiac Drugs II:  -Group instruction provided by verbal instruction and written materials to support subject.  Instructor reviews cardiac drug classes: angiotensin converting enzyme inhibitors (ACE-I), angiotensin II receptor blockers (ARBs), nitrates, and calcium channel blockers.  Instructor discusses reasons, side effects, and lifestyle considerations for each drug class.   Anatomy and Physiology of the Circulatory System:  -Group instruction provided by verbal instruction, video, and written materials to support subject.  Reviews functional anatomy of heart, how it relates to various diagnoses, and what role the heart plays in the overall system.   Knowledge Questionnaire Score:     Knowledge Questionnaire Score - 05/11/16 0826      Knowledge Questionnaire Score   Pre Score 18/24     DM 12/15      Core Components/Risk Factors/Patient Goals at Admission:     Personal Goals and Risk Factors at Admission - 05/07/16 0915      Core Components/Risk Factors/Patient Goals on Admission    Weight Management Yes;Weight Loss   Intervention Weight Management: Develop a combined nutrition and exercise program designed to reach desired caloric intake, while maintaining  appropriate intake of nutrient and fiber, sodium and fats, and appropriate energy expenditure required for the weight goal.;Weight Management: Provide education and appropriate resources to help participant work on and attain dietary goals.;Weight Management/Obesity: Establish reasonable short term and long term weight goals.;Obesity: Provide education and appropriate resources to help participant work on and attain dietary goals.   Expected Outcomes Long Term: Adherence to nutrition and physical activity/exercise program aimed toward attainment of established weight goal;Short Term: Continue to assess and modify interventions until short term weight is achieved;Weight Maintenance: Understanding of the daily nutrition guidelines, which includes 25-35% calories from fat, 7% or less cal from saturated fats, less than 200mg  cholesterol, less than 1.5gm of sodium, & 5 or more servings of fruits and vegetables daily;Weight Loss: Understanding of general recommendations for a balanced deficit meal plan, which promotes 1-2 lb weight loss per week and includes a negative energy balance of [phone] kcal/d;Understanding recommendations for meals to include 15-35% energy as protein, 25-35% energy from fat, 35-60% energy from carbohydrates, less than 200mg  of dietary cholesterol, 20-35 gm of total fiber daily;Understanding of distribution of calorie intake throughout the day with the consumption of 4-5 meals/snacks   Sedentary Yes   Intervention Provide advice, education, support and counseling about physical activity/exercise needs.;Develop an individualized exercise prescription for aerobic and resistive training based on initial evaluation findings, risk stratification, comorbidities and participant's personal goals.   Expected Outcomes Achievement of increased cardiorespiratory fitness and enhanced flexibility,  muscular endurance and strength shown through measurements of functional capacity and personal statement of  participant.   Increase Strength and Stamina Yes   Intervention Provide advice, education, support and counseling about physical activity/exercise needs.;Develop an individualized exercise prescription for aerobic and resistive training based on initial evaluation findings, risk stratification, comorbidities and participant's personal goals.   Expected Outcomes Achievement of increased cardiorespiratory fitness and enhanced flexibility, muscular endurance and strength shown through measurements of functional capacity and personal statement of participant.   Diabetes Yes   Intervention Provide education about signs/symptoms and action to take for hypo/hyperglycemia.;Provide education about proper nutrition, including hydration, and aerobic/resistive exercise prescription along with prescribed medications to achieve blood glucose in normal ranges: Fasting glucose 65-99 mg/dL   Expected Outcomes Short Term: Participant verbalizes understanding of the signs/symptoms and immediate care of hyper/hypoglycemia, proper foot care and importance of medication, aerobic/resistive exercise and nutrition plan for blood glucose control.;Long Term: Attainment of HbA1C < 7%.   Personal Goal Other Yes   Personal Goal short: develop an exercise routine   long: gain energy and stamina and get back on track with health and learn limitations   Intervention Provide exercise programming to improve cardiovascular endurance and provide education on cardiac awareness and risk factors   Expected Outcomes Pt will be more aware of cardiac condition, improve strength and stamina and be complaint with exercise routine      Core Components/Risk Factors/Patient Goals Review:      Goals and Risk Factor Review    Row Name 05/29/16 1017 06/10/16 1714           Core Components/Risk Factors/Patient Goals Review   Personal Goals Review Other Other      Review Participant has established an exercise routine. Currently walking 1 hour  at least 3-4 days per week. Pt states he feels like he has gained more strength.  Stretches twice/day, walks 3 miles, 3 days/wk      Expected Outcomes Maintain regular aerobic and resistance training routine to help improve cardiorespiratory fitness. Maintain regular aerobic and resistance training routine to help improve cardiorespiratory fitness, continue with stretch routine to increase flexibility         Core Components/Risk Factors/Patient Goals at Discharge (Final Review):      Goals and Risk Factor Review - 06/10/16 1714      Core Components/Risk Factors/Patient Goals Review   Personal Goals Review Other   Review Pt states he feels like he has gained more strength.  Stretches twice/day, walks 3 miles, 3 days/wk   Expected Outcomes Maintain regular aerobic and resistance training routine to help improve cardiorespiratory fitness, continue with stretch routine to increase flexibility      ITP Comments:     ITP Comments    Row Name 05/07/16 0912           ITP Comments Dr. Fransico Him, Medical Director          Comments: Stefan is making expected progress toward personal goals after completing 17 sessions. Recommend continued exercise and life style modification education including  stress management and relaxation techniques to decrease cardiac risk profile.Harrell Gave RN BSN

## 2016-07-02 DIAGNOSIS — L814 Other melanin hyperpigmentation: Secondary | ICD-10-CM | POA: Diagnosis not present

## 2016-07-02 DIAGNOSIS — L738 Other specified follicular disorders: Secondary | ICD-10-CM | POA: Diagnosis not present

## 2016-07-02 DIAGNOSIS — L570 Actinic keratosis: Secondary | ICD-10-CM | POA: Diagnosis not present

## 2016-07-02 DIAGNOSIS — C44329 Squamous cell carcinoma of skin of other parts of face: Secondary | ICD-10-CM | POA: Diagnosis not present

## 2016-07-02 DIAGNOSIS — L821 Other seborrheic keratosis: Secondary | ICD-10-CM | POA: Diagnosis not present

## 2016-07-02 DIAGNOSIS — Z85828 Personal history of other malignant neoplasm of skin: Secondary | ICD-10-CM | POA: Diagnosis not present

## 2016-07-03 ENCOUNTER — Encounter (HOSPITAL_COMMUNITY)
Admission: RE | Admit: 2016-07-03 | Discharge: 2016-07-03 | Disposition: A | Discharge status: Home / Self Care | Payer: Medicare Other | Source: Ambulatory Visit | Attending: Cardiology | Admitting: Cardiology

## 2016-07-03 DIAGNOSIS — M75101 Unspecified rotator cuff tear or rupture of right shoulder, not specified as traumatic: Secondary | ICD-10-CM | POA: Diagnosis not present

## 2016-07-03 DIAGNOSIS — Z9889 Other specified postprocedural states: Secondary | ICD-10-CM

## 2016-07-06 ENCOUNTER — Encounter (HOSPITAL_COMMUNITY): Payer: Medicare Other

## 2016-07-06 DIAGNOSIS — M75101 Unspecified rotator cuff tear or rupture of right shoulder, not specified as traumatic: Secondary | ICD-10-CM | POA: Diagnosis not present

## 2016-07-08 ENCOUNTER — Encounter (HOSPITAL_COMMUNITY)
Admission: RE | Admit: 2016-07-08 | Discharge: 2016-07-08 | Disposition: A | Discharge status: Home / Self Care | Payer: Medicare Other | Source: Ambulatory Visit | Attending: Cardiology | Admitting: Cardiology

## 2016-07-08 DIAGNOSIS — Z9889 Other specified postprocedural states: Secondary | ICD-10-CM

## 2016-07-10 ENCOUNTER — Encounter (HOSPITAL_COMMUNITY)
Admission: RE | Admit: 2016-07-10 | Discharge: 2016-07-10 | Disposition: A | Discharge status: Home / Self Care | Payer: Medicare Other | Source: Ambulatory Visit | Attending: Cardiology | Admitting: Cardiology

## 2016-07-10 DIAGNOSIS — Z9889 Other specified postprocedural states: Secondary | ICD-10-CM | POA: Diagnosis not present

## 2016-07-10 DIAGNOSIS — M75101 Unspecified rotator cuff tear or rupture of right shoulder, not specified as traumatic: Secondary | ICD-10-CM | POA: Diagnosis not present

## 2016-07-13 ENCOUNTER — Encounter (HOSPITAL_COMMUNITY)
Admission: RE | Admit: 2016-07-13 | Discharge: 2016-07-13 | Disposition: A | Discharge status: Home / Self Care | Payer: Medicare Other | Source: Ambulatory Visit | Attending: Cardiology | Admitting: Cardiology

## 2016-07-13 DIAGNOSIS — Z9889 Other specified postprocedural states: Secondary | ICD-10-CM

## 2016-07-13 DIAGNOSIS — M75101 Unspecified rotator cuff tear or rupture of right shoulder, not specified as traumatic: Secondary | ICD-10-CM | POA: Diagnosis not present

## 2016-07-15 ENCOUNTER — Encounter (HOSPITAL_COMMUNITY)
Admission: RE | Admit: 2016-07-15 | Discharge: 2016-07-15 | Disposition: A | Discharge status: Home / Self Care | Payer: Medicare Other | Source: Ambulatory Visit | Attending: Cardiology | Admitting: Cardiology

## 2016-07-15 DIAGNOSIS — M75101 Unspecified rotator cuff tear or rupture of right shoulder, not specified as traumatic: Secondary | ICD-10-CM | POA: Diagnosis not present

## 2016-07-15 DIAGNOSIS — Z9889 Other specified postprocedural states: Secondary | ICD-10-CM

## 2016-07-20 ENCOUNTER — Encounter (HOSPITAL_COMMUNITY)
Admission: RE | Admit: 2016-07-20 | Discharge: 2016-07-20 | Disposition: A | Discharge status: Home / Self Care | Payer: Medicare Other | Source: Ambulatory Visit | Attending: Cardiology | Admitting: Cardiology

## 2016-07-20 DIAGNOSIS — M75101 Unspecified rotator cuff tear or rupture of right shoulder, not specified as traumatic: Secondary | ICD-10-CM | POA: Diagnosis not present

## 2016-07-20 DIAGNOSIS — Z9889 Other specified postprocedural states: Secondary | ICD-10-CM | POA: Diagnosis not present

## 2016-07-22 ENCOUNTER — Encounter (HOSPITAL_COMMUNITY)
Admission: RE | Admit: 2016-07-22 | Discharge: 2016-07-22 | Disposition: A | Discharge status: Home / Self Care | Payer: Medicare Other | Source: Ambulatory Visit | Attending: Cardiology | Admitting: Cardiology

## 2016-07-22 DIAGNOSIS — M75101 Unspecified rotator cuff tear or rupture of right shoulder, not specified as traumatic: Secondary | ICD-10-CM | POA: Diagnosis not present

## 2016-07-22 DIAGNOSIS — Z9889 Other specified postprocedural states: Secondary | ICD-10-CM | POA: Diagnosis not present

## 2016-07-23 DIAGNOSIS — H53483 Generalized contraction of visual field, bilateral: Secondary | ICD-10-CM | POA: Diagnosis not present

## 2016-07-23 DIAGNOSIS — H02031 Senile entropion of right upper eyelid: Secondary | ICD-10-CM | POA: Diagnosis not present

## 2016-07-23 DIAGNOSIS — H02034 Senile entropion of left upper eyelid: Secondary | ICD-10-CM | POA: Diagnosis not present

## 2016-07-24 ENCOUNTER — Encounter (HOSPITAL_COMMUNITY): Payer: Medicare Other

## 2016-07-27 ENCOUNTER — Telehealth (HOSPITAL_COMMUNITY): Payer: Self-pay | Admitting: Family Medicine

## 2016-07-27 ENCOUNTER — Encounter (HOSPITAL_COMMUNITY): Payer: Medicare Other

## 2016-07-27 DIAGNOSIS — J209 Acute bronchitis, unspecified: Secondary | ICD-10-CM | POA: Diagnosis not present

## 2016-07-28 DIAGNOSIS — E7800 Pure hypercholesterolemia, unspecified: Secondary | ICD-10-CM | POA: Diagnosis not present

## 2016-07-28 DIAGNOSIS — I10 Essential (primary) hypertension: Secondary | ICD-10-CM | POA: Diagnosis not present

## 2016-07-28 DIAGNOSIS — E291 Testicular hypofunction: Secondary | ICD-10-CM | POA: Diagnosis not present

## 2016-07-28 DIAGNOSIS — Z7984 Long term (current) use of oral hypoglycemic drugs: Secondary | ICD-10-CM | POA: Diagnosis not present

## 2016-07-28 DIAGNOSIS — Z125 Encounter for screening for malignant neoplasm of prostate: Secondary | ICD-10-CM | POA: Diagnosis not present

## 2016-07-28 DIAGNOSIS — R972 Elevated prostate specific antigen [PSA]: Secondary | ICD-10-CM | POA: Diagnosis not present

## 2016-07-28 DIAGNOSIS — E559 Vitamin D deficiency, unspecified: Secondary | ICD-10-CM | POA: Diagnosis not present

## 2016-07-28 DIAGNOSIS — Z0000 Encounter for general adult medical examination without abnormal findings: Secondary | ICD-10-CM | POA: Diagnosis not present

## 2016-07-28 DIAGNOSIS — N5201 Erectile dysfunction due to arterial insufficiency: Secondary | ICD-10-CM | POA: Diagnosis not present

## 2016-07-28 DIAGNOSIS — E119 Type 2 diabetes mellitus without complications: Secondary | ICD-10-CM | POA: Diagnosis not present

## 2016-07-28 DIAGNOSIS — Z79899 Other long term (current) drug therapy: Secondary | ICD-10-CM | POA: Diagnosis not present

## 2016-07-29 ENCOUNTER — Encounter (HOSPITAL_COMMUNITY): Payer: Medicare Other

## 2016-07-30 ENCOUNTER — Telehealth (HOSPITAL_COMMUNITY): Payer: Self-pay | Admitting: *Deleted

## 2016-07-30 ENCOUNTER — Encounter (HOSPITAL_COMMUNITY): Payer: Self-pay | Admitting: *Deleted

## 2016-07-30 DIAGNOSIS — M25611 Stiffness of right shoulder, not elsewhere classified: Secondary | ICD-10-CM | POA: Diagnosis not present

## 2016-07-30 DIAGNOSIS — Z9889 Other specified postprocedural states: Secondary | ICD-10-CM

## 2016-07-30 DIAGNOSIS — M75101 Unspecified rotator cuff tear or rupture of right shoulder, not specified as traumatic: Secondary | ICD-10-CM | POA: Diagnosis not present

## 2016-07-30 NOTE — Progress Notes (Signed)
Cardiac Individual Treatment Plan  Patient Details  Name: [name] MRN: [id number] Date of Birth: 04-10-1946 Referring Provider:   Flowsheet Row CARDIAC REHAB PHASE II ORIENTATION from 05/07/2016 in [facility]  Referring Provider  Aundra Dubin, [place],MD      Initial Encounter Date:  [place] PHASE II ORIENTATION from 05/07/2016 in [facility]  Date  05/07/16  Referring Provider  Aundra Dubin, [place],MD      Visit Diagnosis: 03/25/16 S/P MVR (mitral valve repair)  Patient's Home Medications on Admission:  Current Outpatient Prescriptions:  .  amoxicillin (AMOXIL) 500 MG tablet, Take 4 tablets (2 g) by mouth 30-60 minutes prior to dental appointment, Disp: 12 tablet, Rfl: 0 .  aspirin EC 81 MG tablet, Take 81 mg by mouth daily., Disp: , Rfl:  .  carvedilol (COREG) 3.125 MG tablet, Take 1 tablet (3.125 mg total) by mouth 2 (two) times daily., Disp: 60 tablet, Rfl: 3 .  cetirizine (ZYRTEC) 10 MG chewable tablet, Chew 10 mg by mouth daily., Disp: , Rfl:  .  fluticasone (FLONASE) 50 MCG/ACT nasal spray, Place 1 spray into both nostrils daily as needed for allergies or rhinitis., Disp: , Rfl:  .  furosemide (LASIX) 40 MG tablet, Take 40 mg by mouth as directed. Take 80 mg in the morning and 40 mg in the afternoon, Disp: , Rfl:  .  HYDROcodone-acetaminophen (NORCO) 10-325 MG tablet, Take 1 tablet by mouth every 4 (four) hours as needed for moderate pain., Disp: 20 tablet, Rfl: 0 .  metFORMIN (GLUCOPHAGE) 500 MG tablet, Take 500 mg by mouth 2 (two) times daily with a meal., Disp: , Rfl:  .  nitroGLYCERIN (NITROSTAT) 0.4 MG SL tablet, Place 1 tablet (0.4 mg total) under the tongue every 5 (five) minutes as needed for chest pain., Disp: 25 tablet, Rfl: 3 .  potassium chloride SA (K-DUR,KLOR-CON) 20 MEQ tablet, Take 20 mEq by mouth 2 (two) times daily., Disp: , Rfl:  .  pravastatin (PRAVACHOL) 40 MG tablet, Take 1 tablet (40  mg total) by mouth every evening., Disp: 90 tablet, Rfl: 3 .  tadalafil (CIALIS) 20 MG tablet, Take 20 mg by mouth daily as needed for erectile dysfunction., Disp: , Rfl:  .  traZODone (DESYREL) 100 MG tablet, Take 100 mg by mouth at bedtime as needed for sleep., Disp: , Rfl:  .  VENTOLIN HFA 108 (90 Base) MCG/ACT inhaler, Inhale 1 puff into the lungs every 4 (four) hours as needed for wheezing or shortness of breath. , Disp: , Rfl:   Past Medical History: Past Medical History:  Diagnosis Date  . Allergic rhinitis   . Anxiety   . Cancer ([place])    hx of skin cancers   . Coronary artery disease    a. s/p MI & CABG; b. s/p PCI Diag;  c. Cath 12/2011: LAD diff dzs/small, Diag 75% isr, 90 dist to stent (small), LCX & RCA occluded, VG->OM 40, VG->PDA patent - med rx.  . Depression   . Diabetes mellitus    ORAL MEDS  . Diastolic CHF, chronic (HCC)    a. EF 55-60% by echo 2007  . Dysrhythmia    "abnormal beat"  . GERD (gastroesophageal reflux disease)    "not anymore" " I had a lap band"  . History of diverticulitis of colon    5 YRS AGO  . History of pneumonia    2017  . History of transient ischemic attack (TIA)  CAROTID DOPPLERS NOV 2011  0-39& BIL. STENOSIS  . Hyperlipidemia   . Hypertension   . Mitral regurgitation   . Myocardial infarction   . Neuromuscular disorder ([place])    left arm numbness   . 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 PTSD (post-traumatic stress disorder)    from [place]  . 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 Shortness of breath dyspnea    with exertion  . Sleep apnea    cpap- see ov note in [facility] 05/12/11 for settings     Tobacco Use: History  Smoking Status  . Never Smoker  Smokeless Tobacco  . Never  Used    Labs: Recent Review Flowsheet Data    Labs for ITP Cardiac and Pulmonary Rehab Latest Ref Rng & Units 03/25/2016 03/25/2016 03/25/2016 03/25/2016 03/26/2016   Cholestrol 125 - 200 mg/dL - - - - -   LDLCALC <130 mg/dL - - - - -   HDL >=40 mg/dL - - - - -   Trlycerides <150 mg/dL - - - - -   Hemoglobin A1c 4.8 - 5.6 % - - - - -   PHART 7.350 - 7.450 7.314(L) 7.300(L) 7.317(L) - -   PCO2ART 35.0 - 45.0 mmHg 42.8 40.5 41.5 - -   HCO3 20.0 - 24.0 mEq/L 22.0 20.0 21.1 - -   TCO2 0 - 100 mmol/L 23 21 22 23 22    ACIDBASEDEF 0.0 - 2.0 mmol/L 4.0(H) 6.0(H) 5.0(H) - -   O2SAT % 94.0 98.0 95.0 - -      Capillary Blood Glucose: Lab Results  Component Value Date   GLUCAP 145 (H) 05/15/2016   GLUCAP 147 (H) 05/13/2016   GLUCAP 141 (H) 05/13/2016   GLUCAP 148 (H) 05/11/2016   GLUCAP 176 (H) 05/11/2016     Exercise Target Goals:    Exercise Program Goal: Individual exercise prescription set with THRR, safety & activity barriers. Participant demonstrates ability to understand and report RPE using BORG scale, to self-measure pulse accurately, and to acknowledge the importance of the exercise prescription.  Exercise Prescription Goal: Starting with aerobic activity 30 plus minutes a day, 3 days per week for initial exercise prescription. Provide home exercise prescription and guidelines that participant acknowledges understanding prior to discharge.  Activity Barriers & Risk Stratification:   6 Minute Walk:   Initial Exercise Prescription:   Perform Capillary Blood Glucose checks as needed.  Exercise Prescription Changes:     Exercise Prescription Changes    Row Name 06/10/16 1700 06/29/16 1700 07/29/16 1400         Exercise Review   Progression Yes Yes Yes       Response to Exercise   Blood Pressure (Admit) 114/74 110/78 120/70     Blood Pressure (Exercise) 102/66 130/70 158/72     Blood Pressure (Exit) 100/62 100/70 104/62     Heart Rate (Admit) 91 bpm 95 bpm 91 bpm      Heart Rate (Exercise) 113 bpm 120 bpm 126 bpm     Heart Rate (Exit) 78 bpm 95 bpm 91 bpm     Rating of Perceived Exertion (Exercise) 11 12 13      Comments Reviewed home exercise guidelines on 05/18/16. Reviewed home exercise guidelines on 05/18/16. Reviewed home exercise guidelines  on 05/18/16.     Duration Progress to 30 minutes of continuous aerobic without signs/symptoms of physical distress Progress to 30 minutes of continuous aerobic without signs/symptoms of physical distress Progress to 30 minutes of continuous aerobic without signs/symptoms of physical distress     Intensity THRR unchanged THRR unchanged THRR unchanged       Progression   Progression Continue to progress workloads to maintain intensity without signs/symptoms of physical distress. Continue to progress workloads to maintain intensity without signs/symptoms of physical distress. Continue to progress workloads to maintain intensity without signs/symptoms of physical distress.     Average METs 4.8 5.22 5.5       Resistance Training   Training Prescription Yes Yes Yes     Weight 5lbs 3lbs 5lbs     Reps 10-12 10-12 10-12       Treadmill   MPH 3 3.2 3.2     Grade 3 4 4      Minutes 10 10 20      METs 4.54 5.21 5.21       NuStep   Level 4 5 5      Minutes 10 10 10      METs 3 3.5 3.6       Cybex   Level 3.2 3.2  -     Minutes 10 10  -     METs 6.91 6.95  -       Home Exercise Plan   Plans to continue exercise at [facility]     Frequency Add 3 additional days to program exercise sessions. Add 3 additional days to program exercise sessions. Add 3 additional days to program exercise sessions.        Exercise Comments:     Exercise Comments    Row Name 06/10/16 1713 06/29/16 1710 07/29/16 1402       Exercise Comments Pt does a stretch routine 2xs/day, walks 3 miles, 3 days/week Pt does a stretch routine 2xs/day, walks 3 miles, 3 days/week, continues to progress with exercise  Reviewed METs and goals with pt.  Pt  continues to do well with exercise.         Discharge Exercise Prescription (Final Exercise Prescription Changes):     Exercise Prescription Changes - 07/29/16 1400      Exercise Review   Progression Yes     Response to Exercise   Blood Pressure (Admit) 120/70   Blood Pressure (Exercise) 158/72   Blood Pressure (Exit) 104/62   Heart Rate (Admit) 91 bpm   Heart Rate (Exercise) 126 bpm   Heart Rate (Exit) 91 bpm   Rating of Perceived Exertion (Exercise) 13   Comments Reviewed home exercise guidelines on 05/18/16.   Duration Progress to 30 minutes of continuous aerobic without signs/symptoms of physical distress   Intensity THRR unchanged     Progression   Progression Continue to progress workloads to maintain intensity without signs/symptoms of physical distress.   Average METs 5.5     Resistance Training   Training Prescription Yes   Weight 5lbs   Reps 10-12     Treadmill   MPH 3.2   Grade 4   Minutes 20   METs 5.21     NuStep   Level 5   Minutes 10   METs 3.6     Home Exercise Plan   Plans to continue exercise at Home   Frequency Add 3 additional days to program exercise sessions.      Nutrition:  Target Goals: Understanding  of nutrition guidelines, daily intake of sodium 1500mg , cholesterol 200mg , calories 30% from fat and 7% or less from saturated fats, daily to have 5 or more servings of fruits and vegetables.  Biometrics:    Nutrition Therapy Plan and Nutrition Goals:   Nutrition Discharge: Nutrition Scores:   Nutrition Goals Re-Evaluation:   Psychosocial: Target Goals: Acknowledge presence or absence of depression, maximize coping skills, provide positive support system. Participant is able to verbalize types and ability to use techniques and skills needed for reducing stress and depression.  Initial Review & Psychosocial Screening:   Quality of Life Scores:   PHQ-9: Recent Review Flowsheet Data    Depression screen [facility] 2/9 05/11/2016  02/14/2014   Decreased Interest 0 0   Down, Depressed, Hopeless 0 0   PHQ - 2 Score 0 0      Psychosocial Evaluation and Intervention:   Psychosocial Re-Evaluation:     Psychosocial Re-Evaluation    Row Name 06/04/16 1604 07/01/16 1825 07/30/16 1630         Psychosocial Re-Evaluation   Interventions Encouraged to attend Cardiac Rehabilitation for the exercise Encouraged to attend Cardiac Rehabilitation for the exercise Encouraged to attend Cardiac Rehabilitation for the exercise     Continued Psychosocial Services Needed No No No        Vocational Rehabilitation: Provide vocational rehab assistance to qualifying candidates.   Vocational Rehab Evaluation & Intervention:   Education: Education Goals: Education classes will be provided on a weekly basis, covering required topics. Participant will state understanding/return demonstration of topics presented.  Learning Barriers/Preferences:   Education Topics: Count Your Pulse:  -Group instruction provided by verbal instruction, demonstration, patient participation and written materials to support subject.  Instructors address importance of being able to find your pulse and how to count your pulse when at home without a heart monitor.  Patients get hands on experience counting their pulse with staff help and individually.   Heart Attack, Angina, and Risk Factor Modification:  -Group instruction provided by verbal instruction, video, and written materials to support subject.  Instructors address signs and symptoms of angina and heart attacks.    Also discuss risk factors for heart disease and how to make changes to improve heart health risk factors.   Functional Fitness:  -Group instruction provided by verbal instruction, demonstration, patient participation, and written materials to support subject.  Instructors address safety measures for doing things around the house.  Discuss how to get up and down off the floor, how to  pick things up properly, how to safely get out of a chair without assistance, and balance training.   Meditation and Mindfulness:  -Group instruction provided by verbal instruction, patient participation, and written materials to support subject.  Instructor addresses importance of mindfulness and meditation practice to help reduce stress and improve awareness.  Instructor also leads participants through a meditation exercise.    Stretching for Flexibility and Mobility:  -Group instruction provided by verbal instruction, patient participation, and written materials to support subject.  Instructors lead participants through series of stretches that are designed to increase flexibility thus improving mobility.  These stretches are additional exercise for major muscle groups that are typically performed during regular warm up and cool down.   Hands Only CPR Anytime:  -Group instruction provided by verbal instruction, video, patient participation and written materials to support subject.  Instructors co-teach with AHA video for hands only CPR.  Participants get hands on experience with mannequins.   Nutrition I class: Heart  Healthy Eating:  -Group instruction provided by PowerPoint slides, verbal discussion, and written materials to support subject matter. The instructor gives an explanation and review of the Therapeutic Lifestyle Changes diet recommendations, which includes a discussion on lipid goals, dietary fat, sodium, fiber, plant stanol/sterol esters, sugar, and the components of a well-balanced, healthy diet. Flowsheet Row CARDIAC REHAB PHASE II EXERCISE from 07/10/2016 in [facility]  Date  05/22/16  Educator  RD  Instruction Review Code  Not applicable [class handouts given]      Nutrition II class: Lifestyle Skills:  -Group instruction provided by PowerPoint slides, verbal discussion, and written materials to support subject matter. The instructor gives  an explanation and review of label reading, grocery shopping for heart health, heart healthy recipe modifications, and ways to make healthier choices when eating out. Flowsheet Row CARDIAC REHAB PHASE II EXERCISE from 07/10/2016 in [facility]  Date  05/22/16  Educator  RD  Instruction Review Code  Not applicable [class handouts given]      Diabetes Question & Answer:  -Group instruction provided by PowerPoint slides, verbal discussion, and written materials to support subject matter. The instructor gives an explanation and review of diabetes co-morbidities, pre- and post-prandial blood glucose goals, pre-exercise blood glucose goals, signs, symptoms, and treatment of hypoglycemia and hyperglycemia, and foot care basics.   Diabetes Blitz:  -Group instruction provided by PowerPoint slides, verbal discussion, and written materials to support subject matter. The instructor gives an explanation and review of the physiology behind type 1 and type 2 diabetes, diabetes medications and rational behind using different medications, pre- and post-prandial blood glucose recommendations and Hemoglobin A1c goals, diabetes diet, and exercise including blood glucose guidelines for exercising safely.  Flowsheet Row CARDIAC REHAB PHASE II EXERCISE from 07/10/2016 in [facility]  Date  05/22/16  Educator  RD  Instruction Review Code  Not applicable [class handouts given]      Portion Distortion:  -Group instruction provided by PowerPoint slides, verbal discussion, written materials, and food models to support subject matter. The instructor gives an explanation of serving size versus portion size, changes in portions sizes over the last 20 years, and what consists of a serving from each food group.   Stress Management:  -Group instruction provided by verbal instruction, video, and written materials to support subject matter.  Instructors review role  of stress in heart disease and how to cope with stress positively.   Flowsheet Row CARDIAC REHAB PHASE II EXERCISE from 07/10/2016 in [facility]  Date  07/08/16  Instruction Review Code  2- meets goals/outcomes      Exercising on Your Own:  -Group instruction provided by verbal instruction, power point, and written materials to support subject.  Instructors discuss benefits of exercise, components of exercise, frequency and intensity of exercise, and end points for exercise.  Also discuss use of nitroglycerin and activating EMS.  Review options of places to exercise outside of rehab.  Review guidelines for sex with heart disease. Flowsheet Row CARDIAC REHAB PHASE II EXERCISE from 07/10/2016 in [facility]  Date  07/10/16  Instruction Review Code  2- meets goals/outcomes      Cardiac Drugs I:  -Group instruction provided by verbal instruction and written materials to support subject.  Instructor reviews cardiac drug classes: antiplatelets, anticoagulants, beta blockers, and statins.  Instructor discusses reasons, side effects, and lifestyle considerations for each drug  class.   Cardiac Drugs II:  -Group instruction provided by verbal instruction and written materials to support subject.  Instructor reviews cardiac drug classes: angiotensin converting enzyme inhibitors (ACE-I), angiotensin II receptor blockers (ARBs), nitrates, and calcium channel blockers.  Instructor discusses reasons, side effects, and lifestyle considerations for each drug class.   Anatomy and Physiology of the Circulatory System:  -Group instruction provided by verbal instruction, video, and written materials to support subject.  Reviews functional anatomy of heart, how it relates to various diagnoses, and what role the heart plays in the overall system.   Knowledge Questionnaire Score:   Core Components/Risk Factors/Patient Goals at Admission:   Core  Components/Risk Factors/Patient Goals Review:      Goals and Risk Factor Review    Row Name 06/10/16 1714 07/29/16 1402           Core Components/Risk Factors/Patient Goals Review   Personal Goals Review Other Other      Review Pt states he feels like he has gained more strength.  Stretches twice/day, walks 3 miles, 3 days/wk Pt is walking one hour 3days/week in addition to CRPII.  He also sees the Chiropractor 2xs/wk      Expected Outcomes Maintain regular aerobic and resistance training routine to help improve cardiorespiratory fitness, continue with stretch routine to increase flexibility Maintain regular aerobic and resistance training routine to help improve cardiorespiratory fitness, continue with stretch routine to increase flexibility         Core Components/Risk Factors/Patient Goals at Discharge (Final Review):      Goals and Risk Factor Review - 07/29/16 1402      Core Components/Risk Factors/Patient Goals Review   Personal Goals Review Other   Review Pt is walking one hour 3days/week in addition to CRPII.  He also sees the Chiropractor 2xs/wk   Expected Outcomes Maintain regular aerobic and resistance training routine to help improve cardiorespiratory fitness, continue with stretch routine to increase flexibility      ITP Comments:   Comments: [name] is making expected progress toward personal goals after completing 24 sessions. Recommend continued exercise and life style modification education including  stress management and relaxation techniques to decrease cardiac risk profile. [name] has been out with a cold and hopes to return to exercise on Monday.

## 2016-07-31 ENCOUNTER — Encounter (HOSPITAL_COMMUNITY)
Admission: RE | Admit: 2016-07-31 | Discharge: 2016-07-31 | Disposition: A | Discharge status: Home / Self Care | Payer: Medicare Other | Source: Ambulatory Visit | Attending: Cardiology | Admitting: Cardiology

## 2016-07-31 DIAGNOSIS — Z9889 Other specified postprocedural states: Secondary | ICD-10-CM | POA: Insufficient documentation

## 2016-08-03 ENCOUNTER — Encounter (HOSPITAL_COMMUNITY)
Admission: RE | Admit: 2016-08-03 | Discharge: 2016-08-03 | Disposition: A | Discharge status: Home / Self Care | Payer: Medicare Other | Source: Ambulatory Visit | Attending: Cardiology | Admitting: Cardiology

## 2016-08-03 DIAGNOSIS — Z9889 Other specified postprocedural states: Secondary | ICD-10-CM

## 2016-08-05 ENCOUNTER — Telehealth: Payer: Self-pay | Admitting: Cardiology

## 2016-08-05 ENCOUNTER — Encounter (HOSPITAL_COMMUNITY)
Admission: RE | Admit: 2016-08-05 | Discharge: 2016-08-05 | Disposition: A | Discharge status: Home / Self Care | Payer: Medicare Other | Source: Ambulatory Visit | Attending: Cardiology | Admitting: Cardiology

## 2016-08-05 ENCOUNTER — Telehealth: Payer: Self-pay | Admitting: *Deleted

## 2016-08-05 DIAGNOSIS — Z9889 Other specified postprocedural states: Secondary | ICD-10-CM

## 2016-08-05 NOTE — Telephone Encounter (Signed)
Thanks.  That is good.

## 2016-08-05 NOTE — Telephone Encounter (Signed)
Pt was in Rehab and his wt is up 2 Kg and he is SOB also with cold sxs he will take extra 40 mg this afternoon.  We will have him seen in the office this week.

## 2016-08-05 NOTE — Progress Notes (Signed)
Jason [name]'s weight is up 2.0 kg from Monday. [name] reports feeling short of breath with exertion and has no energy. [place] recently had a cold and finished taking a z pack. Upon assessment lung fields with diminished breath sounds left base other wise lung fields clear upon ascultation. Temperature 98.1. Oxygen saturation 95% on room air. No peripheral edema noted. Cecilie Kicks ANP called and notified. [name] told me he took an extra lasix this morning. Mickel Baas talked with [name] via telephone and gave him instructions on his lasix. [place] said he will also follow up with his primary care physician. [name] has an appointment for lab work on Friday at Dr [employer] office. Barnet Pall, RN,BSN 08/05/2016 11:21 AM

## 2016-08-05 NOTE — Telephone Encounter (Signed)
Per Cecilie Kicks, NP, she received a call from Cardiac Rehab re:pt, and he is having some CHF symptoms and needs an appt this week.  Pt made aware that he is scheduled to come in and see Cecilie Kicks, NP on 08/07/16 arriving 9:45   Pt verbalized understanding.

## 2016-08-06 DIAGNOSIS — I509 Heart failure, unspecified: Secondary | ICD-10-CM | POA: Diagnosis not present

## 2016-08-06 DIAGNOSIS — R0989 Other specified symptoms and signs involving the circulatory and respiratory systems: Secondary | ICD-10-CM | POA: Diagnosis not present

## 2016-08-06 DIAGNOSIS — R635 Abnormal weight gain: Secondary | ICD-10-CM | POA: Diagnosis not present

## 2016-08-06 NOTE — Progress Notes (Signed)
Cardiology Office Note   Date:  08/07/2016   ID:  [name], DOB [birth year], MRN [id number]  PCP:  Gennette Pac, MD  Cardiologist:  Dr. Aundra Dubin    No chief complaint on file.     History of Present Illness: [name] is a 70 y.o. male who presents for SOB and increased wt.   He has a history of CAD s/p CABG and obesity s/p lap banding procedure presents for cardiology followup.  Given exertional dyspnea and chest tightness, he had LHC in 1/15.  This showed no change from prior.  Grafts were patent and there was severe stenosis in distal D1, not amenable to PCI.   This year, he developed increased exertional dyspnea.  Eventually had a TEE showing severe MR with prolapse of a portion of the anterior mitral valve leaflet.  Coronary angiography in 6/17 showed patent grafts and 90% stenosis of distal diagonal not amenable to intervention.  In 8/17, he had mitral valve repair.  Post-op diastolic CHF, now on Lasix 80 qam/40 qpm.   Gradually improving, less exertional dyspnea. Short of breath only with heavy exertion though he fatigues easily.  He developed myalgias and stopped Crestor.  Myalgias resolved.   Per Dr. Roxy Manns, "The patient has been reminded regarding the importance of dental hygiene and the lifelong need for antibiotic prophylaxis for all dental cleanings and other related invasive procedures.  We have discussed the results of his recent follow-up echocardiogram including the fact that he may have some mild degree of functional mitral stenosis following mitral valve repair using a relatively small sized annuloplasty ring. Implications regarding his peak exercise tolerance and long term prognosis were discussed"  Today He feels better, he has seen PCP and another round of ABX was done and BNP was low.  He did void a lot with extra 40 of lasix.  His wife is with him today and she reports irregular pulse at times.  She is concerned that he has a fib.  At times with any  incline he becomes SOB.  He has to stop.  He can do treadmill without problems.  The worst episode per wife was in Oct. And he became pale and had to stop activity.  His pulse was irregular.  Less episodes since.  HR at rehab has been stable. No rapid HR and rare elevated BP.    Past Medical History:  Diagnosis Date  . Allergic rhinitis   . Anxiety   . Cancer ([place])    hx of skin cancers   . Coronary artery disease    a. s/p MI & CABG; b. s/p PCI Diag;  c. Cath 12/2011: LAD diff dzs/small, Diag 75% isr, 90 dist to stent (small), LCX & RCA occluded, VG->OM 40, VG->PDA patent - med rx.  . Depression   . Diabetes mellitus    ORAL MEDS  . Diastolic CHF, chronic (HCC)    a. EF 55-60% by echo 2007  . Dysrhythmia    "abnormal beat"  . GERD (gastroesophageal reflux disease)    "not anymore" " I had a lap band"  . History of diverticulitis of colon    5 YRS AGO  . History of pneumonia    2017  . History of transient ischemic attack (TIA)    CAROTID DOPPLERS NOV 2011  0-39& BIL. STENOSIS  . Hyperlipidemia   . Hypertension   . Mitral regurgitation   . Myocardial infarction   . Neuromuscular disorder ([place])    left  arm numbness   . 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 PTSD (post-traumatic stress disorder)    from [place]  . 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 Shortness of breath dyspnea    with exertion  . Sleep apnea    cpap- see ov note in [facility] 05/12/11 for settings     Past Surgical History:  Procedure Laterality Date  . BLEPHAROPLASTY  1985   BILATERAL  . CARDIAC CATHETERIZATION  2001, 2004, 2009, 2011  . CARDIAC CATHETERIZATION N/A 01/23/2016   Procedure: Right/Left Heart Cath and Coronary Angiography;  Surgeon: [name],  MD;  Location: [place] [facility];  Service: Cardiovascular;  Laterality: N/A;  . CATARACT EXTRACTION W/ INTRAOCULAR LENS IMPLANT Bilateral   . CHONDROPLASTY  07/22/2011   Procedure: CHONDROPLASTY;  Surgeon: Dione Plover Aluisio;  Location: [facility];  Service: Orthopedics;;  . CIRCUMCISION  35 YRS  AGO  . COLONOSCOPY    . CORONARY ANGIOPLASTY  1996-- POST CABG   W/ STENT, last cath 01/07/2012   . CORONARY ARTERY BYPASS GRAFT  1995   X3 VESSEL  . CORONARY STENT PLACEMENT    . KNEE ARTHROSCOPY  07/22/2011   Procedure: ARTHROSCOPY KNEE;  Surgeon: [name];  Location: [facility];  Service: Orthopedics;  Laterality: Right;  WITH DEBRIDEMENT   . KNEE ARTHROSCOPY W/ MENISCECTOMY  X2 IN 2002-- RIGHT KNEE  . LAPAROSCOPIC GASTRIC BANDING  01-15-09  . LEFT HEART CATHETERIZATION WITH CORONARY ANGIOGRAM N/A 09/11/2013   Procedure: LEFT HEART CATHETERIZATION WITH CORONARY ANGIOGRAM;  Surgeon: [name], MD;  [facility];  Service: Cardiovascular;  Laterality: N/A;  . MITRAL VALVE REPAIR Right 03/25/2016   Procedure: MINIMALLY INVASIVE REOPERATION FOR MITRAL VALVE REPAIR  (MVR) with size 26 Sorin Memo 3D;  Surgeon: [name], MD;  Location: [facility];  Service: Open Heart Surgery;  Laterality: Right;  . RIGHT KNEE ED COMPARTMENT REPLACEMENT  2004  . SYNOVECTOMY  07/22/2011   Procedure: SYNOVECTOMY;  Surgeon: [name];  Location: [place] [facility];  Service: Orthopedics;;  . TEE WITHOUT CARDIOVERSION N/A 01/23/2016   Procedure: TRANSESOPHAGEAL ECHOCARDIOGRAM (TEE);  Surgeon: [name], MD;  Location: [facility];  Service: Cardiovascular;  Laterality: N/A;  . TEE WITHOUT CARDIOVERSION N/A 03/25/2016   Procedure: TRANSESOPHAGEAL ECHOCARDIOGRAM (TEE);  Surgeon: [name], MD;  Location: [facility];  Service: Open Heart Surgery;  Laterality: N/A;  . UMBILICAL HERNIA REPAIR  01-15-09   W/ GASTRIC BANDING PROCEDURE     Current  Outpatient Prescriptions  Medication Sig Dispense Refill  . amoxicillin (AMOXIL) 500 MG tablet Take 4 tablets (2 g) by mouth 30-60 minutes prior to dental appointment 12 tablet 0  . aspirin EC 81 MG tablet Take 81 mg by mouth daily.    Marland Kitchen azithromycin (ZITHROMAX) 250 MG tablet Take 250 mg by mouth daily. TAKE TWO FIRST DAY THEN TAKE ONE FOR FOUR DAYS    . carvedilol (COREG) 3.125 MG tablet Take 1 tablet (3.125 mg total) by mouth 2 (two) times daily. 60 tablet 3  . cetirizine (ZYRTEC) 10 MG chewable tablet Chew 10 mg by mouth as needed.     . fluticasone (FLONASE) 50 MCG/ACT nasal spray Place 1 spray into  both nostrils daily as needed for allergies or rhinitis.    . furosemide (LASIX) 40 MG tablet Take 40 mg by mouth as directed. Take 80 mg in the morning and 40 mg in the afternoon    . HYDROcodone-acetaminophen (NORCO) 10-325 MG tablet Take 1 tablet by mouth every 4 (four) hours as needed for moderate pain. 20 tablet 0  . metFORMIN (GLUCOPHAGE) 500 MG tablet Take 500 mg by mouth 2 (two) times daily with a meal.    . nitroGLYCERIN (NITROSTAT) 0.4 MG SL tablet Place 1 tablet (0.4 mg total) under the tongue every 5 (five) minutes as needed for chest pain. 25 tablet 3  . potassium chloride SA (K-DUR,KLOR-CON) 20 MEQ tablet Take 20 mEq by mouth 2 (two) times daily.    . pravastatin (PRAVACHOL) 40 MG tablet Take 1 tablet (40 mg total) by mouth every evening. 90 tablet 3  . tadalafil (CIALIS) 20 MG tablet Take 20 mg by mouth daily as needed for erectile dysfunction.    . traZODone (DESYREL) 100 MG tablet Take 100 mg by mouth at bedtime.     . VENTOLIN HFA 108 (90 Base) MCG/ACT inhaler Inhale 1 puff into the lungs every 4 (four) hours as needed for wheezing or shortness of breath.      No current facility-administered medications for this visit.     Allergies:   Codeine    Social History:  The patient  reports that he has never smoked. He has never used smokeless tobacco. He reports that he drinks  alcohol. He reports that he does not use drugs.   Family History:  The patient's family history includes CVA in his father; Congestive Heart Failure in his brother and sister; Depression in his father; Diabetes in his sister; Heart disease in his brother, father, and sister; Hypertension in his brother, father, and sister; Other in his mother; Stroke in his sister.    ROS:  General:+ colds no fevers, no weight changes Skin:no rashes or ulcers HEENT:no blurred vision, no congestion CV:see HPI PUL:see HPI GI:no diarrhea constipation or melena, no indigestion GU:no hematuria, no dysuria MS:no joint pain, no claudication Neuro:no syncope, no lightheadedness Endo:+ diabetes, no thyroid disease  Wt Readings from Last 3 Encounters:  08/07/16 220 lb (99.8 kg)  06/22/16 221 lb (100.2 kg)  06/05/16 224 lb 12.8 oz (102 kg)     PHYSICAL EXAM: VS:  BP 108/80   Pulse 90   Ht 5\' 10"  (1.778 m)   Wt 220 lb (99.8 kg)   BMI 31.57 kg/m  , BMI Body mass index is 31.57 kg/m. General:Pleasant affect, NAD Skin:Warm and dry, brisk capillary refill HEENT:normocephalic, sclera clear, mucus membranes moist, voice sounds nasal Neck:supple, no JVD, no bruits  Heart:S1S2 RRR without murmur, gallup, rub or click Lungs:clear without rales, rhonchi, or wheezes WIO:XBDZ, non tender, + BS, do not palpate liver spleen or masses, small lump palpated of gastric sleeve.  Ext:no lower ext edema, 2+ pedal pulses, 2+ radial pulses Neuro:alert and oriented X 3, MAE, follows commands, + facial symmetry    EKG:  EKG is NOT ordered today.    Recent Labs: 03/23/2016: ALT 13 03/26/2016: Magnesium 2.2 04/10/2016: Brain Natriuretic Peptide 158.7; Hemoglobin 12.2; Platelets 384 06/05/2016: BUN 19; Creat 1.66; Potassium 3.6; Sodium 141    Lipid Panel    Component Value Date/Time   CHOL 121 (L) 08/08/2015 1502   TRIG 113 08/08/2015 1502   HDL 37 (L) 08/08/2015 1502   CHOLHDL 3.3 08/08/2015 1502  VLDL 23  08/08/2015 1502   LDLCALC 61 08/08/2015 1502       Other studies Reviewed: Additional studies/ records that were reviewed today include: . ECHO 04/17/16 Study Conclusions  - Left ventricle: The cavity size was normal. There was moderate   concentric hypertrophy. Systolic function was normal. The   estimated ejection fraction was in the range of 55% to 60%. Wall   motion was normal; there were no regional wall motion   abnormalities. Features are consistent with a pseudonormal left   ventricular filling pattern, with concomitant abnormal relaxation   and increased filling pressure (grade 2 diastolic dysfunction). - Aorta: Ascending aortic diameter: 42 mm (S). - Ascending aorta: The ascending aorta was mildly dilated. - Mitral valve: Annuloplasty ring, post mitral valve repair. There   was trivial regurgitation. Mean gradient (D): 12 mm Hg. - Left atrium: The atrium was mildly dilated. - Right ventricle: The cavity size was mildly dilated. Wall   thickness was normal. - Right atrium: The atrium was mildly dilated.   ASSESSMENT AND PLAN:  1.  SOB and Wt gain in combination with URI, may have just been volume overload.  On extra dose of lasix and wt is normal -continue current dose of lasix.  URI treated by PCP.  Follow up with Dr. Aundra Dubin will check with Dr. Aundra Dubin if pt to follow him to HF clinic.    2. Irregular rapid HR at times.  2 week event monitor.  To eval for a fib.   3. S/p mitral valve repair: Doing well so far. Post-op echo in 8/17 showed elevated mean gradient across the valve but normal pressure half-time (?high flow).   4. CAD: Most recent cardiac cath showed stable coronary anatomy.  He has severe stenosis in a distal D1 that is not amenable to intervention.  5. Chronic diastolic CHF: Volume status looks good today.  LVH and diastolic dysfunction on last echo.  - Continue Lasix 80 qam/40 qpm. Recent labs per PCP.   6. OSA: Continue CPAP- wears every  night.  Current medicines are reviewed with the patient today.  The patient Has no concerns regarding medicines.  The following changes have been made:  See above Labs/ tests ordered today include:see above  Disposition:   FU:  see above  Signed, Cecilie Kicks, NP  08/07/2016 10:12 AM    [address], [place], [place], [place] Phone: [phone]; Fax: [phone]

## 2016-08-07 ENCOUNTER — Ambulatory Visit: Payer: Medicare Other | Admitting: Cardiology

## 2016-08-07 ENCOUNTER — Ambulatory Visit (INDEPENDENT_AMBULATORY_CARE_PROVIDER_SITE_OTHER): Payer: Medicare Other | Admitting: Cardiology

## 2016-08-07 ENCOUNTER — Other Ambulatory Visit: Payer: Medicare Other | Admitting: *Deleted

## 2016-08-07 ENCOUNTER — Encounter (HOSPITAL_COMMUNITY): Payer: Medicare Other

## 2016-08-07 ENCOUNTER — Encounter: Payer: Self-pay | Admitting: Cardiology

## 2016-08-07 VITALS — BP 108/80 | HR 90 | Ht 70.0 in | Wt 220.0 lb

## 2016-08-07 DIAGNOSIS — I499 Cardiac arrhythmia, unspecified: Secondary | ICD-10-CM | POA: Diagnosis not present

## 2016-08-07 DIAGNOSIS — E7800 Pure hypercholesterolemia, unspecified: Secondary | ICD-10-CM

## 2016-08-07 DIAGNOSIS — Z9889 Other specified postprocedural states: Secondary | ICD-10-CM | POA: Diagnosis not present

## 2016-08-07 DIAGNOSIS — I5032 Chronic diastolic (congestive) heart failure: Secondary | ICD-10-CM | POA: Diagnosis not present

## 2016-08-07 DIAGNOSIS — Z79899 Other long term (current) drug therapy: Secondary | ICD-10-CM | POA: Diagnosis not present

## 2016-08-07 DIAGNOSIS — R000 Tachycardia, unspecified: Secondary | ICD-10-CM | POA: Diagnosis not present

## 2016-08-07 DIAGNOSIS — I25118 Atherosclerotic heart disease of native coronary artery with other forms of angina pectoris: Secondary | ICD-10-CM

## 2016-08-07 DIAGNOSIS — I10 Essential (primary) hypertension: Secondary | ICD-10-CM

## 2016-08-07 LAB — LIPID PANEL
CHOL/HDL RATIO: 5.3 ratio — AB (ref <5.0)
Cholesterol: 159 mg/dL (ref <200)
HDL: 30 mg/dL — AB (ref >40)
LDL CALC: 60 mg/dL (ref <100)
TRIGLYCERIDES: 345 mg/dL — AB (ref <150)
VLDL: 69 mg/dL — AB (ref <30)

## 2016-08-07 LAB — HEPATIC FUNCTION PANEL
ALBUMIN: 4 g/dL (ref 3.6–5.1)
ALT: 7 U/L — ABNORMAL LOW (ref 9–46)
AST: 12 U/L (ref 10–35)
Alkaline Phosphatase: 78 U/L (ref 40–115)
BILIRUBIN TOTAL: 0.5 mg/dL (ref 0.2–1.2)
Bilirubin, Direct: 0.1 mg/dL (ref <=0.2)
Indirect Bilirubin: 0.4 mg/dL (ref 0.2–1.2)
TOTAL PROTEIN: 5.9 g/dL — AB (ref 6.1–8.1)

## 2016-08-07 NOTE — Patient Instructions (Signed)
Medication Instructions:  The current medical regimen is effective;  continue present plan and medications.  Testing/Procedures: Your physician/PA has recommended that you wear an event monitor for 2 weeks. Event monitors are medical devices that record the heart's electrical activity. Doctors most often [place] these monitors to diagnose arrhythmias. Arrhythmias are problems with the speed or rhythm of the heartbeat. The monitor is a small, portable device. You can wear one while you do your normal daily activities. This is usually used to diagnose what is causing palpitations/syncope (passing out).  Follow-Up: Will be determined by Dr Aundra Dubin.  If you need a refill on your cardiac medications before your next appointment, please call your pharmacy.  Thank you for choosing [facility]!!

## 2016-08-07 NOTE — Progress Notes (Signed)
I will see him in the HF clinic.  Needs followup with me at some point, I think schedule there is filled until after New Years.

## 2016-08-10 ENCOUNTER — Encounter (HOSPITAL_COMMUNITY)
Admission: RE | Admit: 2016-08-10 | Discharge: 2016-08-10 | Disposition: A | Discharge status: Home / Self Care | Payer: Medicare Other | Source: Ambulatory Visit | Attending: Cardiology | Admitting: Cardiology

## 2016-08-10 DIAGNOSIS — Z9889 Other specified postprocedural states: Secondary | ICD-10-CM | POA: Diagnosis not present

## 2016-08-11 ENCOUNTER — Ambulatory Visit (HOSPITAL_COMMUNITY)
Admission: RE | Admit: 2016-08-11 | Discharge: 2016-08-11 | Disposition: A | Discharge status: Home / Self Care | Payer: Medicare Other | Source: Ambulatory Visit | Attending: Cardiology | Admitting: Cardiology

## 2016-08-11 VITALS — BP 122/72 | HR 95 | Wt 219.0 lb

## 2016-08-11 DIAGNOSIS — I340 Nonrheumatic mitral (valve) insufficiency: Secondary | ICD-10-CM | POA: Diagnosis not present

## 2016-08-11 DIAGNOSIS — I2510 Atherosclerotic heart disease of native coronary artery without angina pectoris: Secondary | ICD-10-CM | POA: Diagnosis not present

## 2016-08-11 DIAGNOSIS — Z9889 Other specified postprocedural states: Secondary | ICD-10-CM

## 2016-08-11 DIAGNOSIS — G4733 Obstructive sleep apnea (adult) (pediatric): Secondary | ICD-10-CM | POA: Diagnosis not present

## 2016-08-11 DIAGNOSIS — E669 Obesity, unspecified: Secondary | ICD-10-CM | POA: Insufficient documentation

## 2016-08-11 DIAGNOSIS — Z8673 Personal history of transient ischemic attack (TIA), and cerebral infarction without residual deficits: Secondary | ICD-10-CM | POA: Insufficient documentation

## 2016-08-11 DIAGNOSIS — E785 Hyperlipidemia, unspecified: Secondary | ICD-10-CM | POA: Insufficient documentation

## 2016-08-11 DIAGNOSIS — R002 Palpitations: Secondary | ICD-10-CM | POA: Diagnosis not present

## 2016-08-11 DIAGNOSIS — N183 Chronic kidney disease, stage 3 (moderate): Secondary | ICD-10-CM | POA: Insufficient documentation

## 2016-08-11 DIAGNOSIS — Z7984 Long term (current) use of oral hypoglycemic drugs: Secondary | ICD-10-CM | POA: Diagnosis not present

## 2016-08-11 DIAGNOSIS — I5022 Chronic systolic (congestive) heart failure: Secondary | ICD-10-CM

## 2016-08-11 DIAGNOSIS — Z7982 Long term (current) use of aspirin: Secondary | ICD-10-CM | POA: Diagnosis not present

## 2016-08-11 DIAGNOSIS — I5032 Chronic diastolic (congestive) heart failure: Secondary | ICD-10-CM | POA: Insufficient documentation

## 2016-08-11 DIAGNOSIS — E1122 Type 2 diabetes mellitus with diabetic chronic kidney disease: Secondary | ICD-10-CM | POA: Insufficient documentation

## 2016-08-11 DIAGNOSIS — I130 Hypertensive heart and chronic kidney disease with heart failure and stage 1 through stage 4 chronic kidney disease, or unspecified chronic kidney disease: Secondary | ICD-10-CM | POA: Diagnosis not present

## 2016-08-11 DIAGNOSIS — Z79899 Other long term (current) drug therapy: Secondary | ICD-10-CM | POA: Insufficient documentation

## 2016-08-11 DIAGNOSIS — Z6831 Body mass index (BMI) 31.0-31.9, adult: Secondary | ICD-10-CM | POA: Diagnosis not present

## 2016-08-11 LAB — BASIC METABOLIC PANEL
ANION GAP: 11 (ref 5–15)
BUN: 12 mg/dL (ref 6–20)
CALCIUM: 9.4 mg/dL (ref 8.9–10.3)
CO2: 26 mmol/L (ref 22–32)
Chloride: 101 mmol/L (ref 101–111)
Creatinine, Ser: 1.75 mg/dL — ABNORMAL HIGH (ref 0.61–1.24)
GFR, EST AFRICAN AMERICAN: 43 mL/min — AB (ref >60)
GFR, EST NON AFRICAN AMERICAN: 37 mL/min — AB (ref >60)
GLUCOSE: 262 mg/dL — AB (ref 65–99)
POTASSIUM: 3.6 mmol/L (ref 3.5–5.1)
Sodium: 138 mmol/L (ref 135–145)

## 2016-08-11 MED ORDER — FUROSEMIDE 40 MG PO TABS: ORAL_TABLET | ORAL | 3 refills | Status: DC

## 2016-08-11 NOTE — Progress Notes (Signed)
Patient ID: [name], male   DOB: 09/17/1945, 70 y.o.   MRN: [id number] PCP: Dr Harrington Challenger  70 yo with history of CAD s/p CABG and obesity s/p lap banding procedure presents for cardiology followup.  Given exertional dyspnea and chest tightness, he had LHC in 1/15.  This showed no change from prior.  Grafts were patent and there was severe stenosis in distal D1, not amenable to PCI.   This year, he developed increased exertional dyspnea.  Eventually had a TEE showing severe MR with prolapse of a portion of the anterior mitral valve leaflet.  Coronary angiography in 6/17 showed patent grafts and 90% stenosis of distal diagonal not amenable to intervention.  In 8/17, he had mitral valve repair.  Post-op diastolic CHF, now on Lasix 80 qam/40 qpm.   Today he presents for an acute visit due to increased dyspnea. On Friday he increased lasix to 80 mg twice a day. Says he eats out most days and had ham last week. Attends cardiac rehab 3 days a week. Able to walk 20 minutes at a time at Cardiac Rehab. Yesterday he says he was hypotensive. Mild dypsnea with exeriton. Denies PND/orthopnea. Weight at home 210-215 pounds. Taking all medications. Plans for  2 week event monitor due to palpitations.   ECG: NSR, normal  Labs (11/12): LDL 70 Labs (5/13): K 4.1, creatinine 1.1, BNP 48 Labs (12/13): K 3.4, creatinine 1.07, LDL 59, HDL 37 Labs (1/15): K 4.3, creatinine 1.2, HCT 40.3 Labs (3/15): LDL 38, HDL 81, BNP 55, K 4, creatinine 1.2 Labs (6/15): K 4.2, creatinine 1.4, LFTs normal, LDL 66, HDL 35 Labs (8/16): K 4, creatinine 1.6, BNP 64 Labs (12/16): LDL 61, HDL 37 Labs (4/17): K 4.2, creatinine 1.35, HCT 38.7, BNP 80 Labs (8/17): K 4.2, creatinine 1.4 => 1.66, BNP 159  PMH: 1. OSA: on CPAP.  2. Diabetes mellitus 3. Allergic rhinitis 4. CAD: s/p CABG 1995.  LHC (10/11) with 90% distal D1 (patent proximal D1 stent), total occlusion CFX, total occlusion RCA, no significant stenoses in LAD, SVG-PDA patent,  SVG-OM patent, EF 55%.  Medical management.  LHC (5/13) with patent grafts and 90% distal D1 stenosis (no significant change from 10/11).  LHC (1/15) with patent grafts, 75% ISR in D1 stent, 90% stenosis distal D1 (small caliber). D1 not amenable to intervention.  - Coronary angiography (6/17): grafts patent, 90% stenosis distally in large diagonal not amenable to intervention.   5. Obesity: lap-banding in 5/11.  6. Chronic diastolic CHF: Echo (9/76) with EF 55-60%, moderate LVH, moderate diastolic dysfunction, s/p mitral valve repair with elevated mean gradient but normal pressure half-time.  7. TIA: Carotid dopplers in 11/11 with 0-39% bilateral stenosis.  Carotid dopplers 6/17 with 1-39% RICA stenosis.  8. OA: Knees, C-spine. TKR 9/13.  9. HTN 10. Hyperlipidemia: Myalgias with atorvastatin and Crestor.  11. Diverticulosis 12. MItral regurgitation: TEE (6/17) with EF 55-60%, severe eccentric MR, prolapse of a portion of the anterior leaflet, peak RV-RA gradient 45 mmHg.  - Mitral valve repair 8/17 13. CKD: Stage III.   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  Lives in [place].   Family History  Problem Relation Age of Onset  . Other Mother     joint problems  . Hypertension Father   . Heart disease Father   . CVA Father   . Depression Father   . Heart disease Sister     CABG  . Stroke Sister   .  Hypertension Sister   . Congestive Heart Failure Sister   . Diabetes Sister   . Heart disease Brother   . Hypertension Brother   . Congestive Heart Failure Brother     ROS: All systems reviewed and negative except as per HPI.    Current Outpatient Prescriptions  Medication Sig Dispense Refill  . amoxicillin (AMOXIL) 500 MG tablet Take 4 tablets (2 g) by mouth 30-60 minutes prior to dental appointment 12 tablet 0  . aspirin EC 81 MG tablet Take 81 mg by mouth daily.    Marland Kitchen azithromycin (ZITHROMAX) 250 MG tablet Take 250 mg by mouth daily. TAKE TWO FIRST DAY THEN  TAKE ONE FOR FOUR DAYS    . carvedilol (COREG) 3.125 MG tablet Take 1 tablet (3.125 mg total) by mouth 2 (two) times daily. 60 tablet 3  . cetirizine (ZYRTEC) 10 MG chewable tablet Chew 10 mg by mouth as needed.     . Chlorpheniramine-DM (CORICIDIN HBP COUGH/COLD PO) Take 1 tablet by mouth daily as needed (cold).    . Cholecalciferol (VITAMIN D) 2000 units CAPS Take 2,000 Units by mouth daily.    . fluticasone (FLONASE) 50 MCG/ACT nasal spray Place 1 spray into both nostrils daily as needed for allergies or rhinitis.    . furosemide (LASIX) 40 MG tablet Take 80 mg by mouth 2 (two) times daily.     Marland Kitchen HYDROcodone-acetaminophen (NORCO) 10-325 MG tablet Take 1 tablet by mouth every 4 (four) hours as needed for moderate pain. 20 tablet 0  . metFORMIN (GLUCOPHAGE) 500 MG tablet Take 500 mg by mouth 2 (two) times daily with a meal.    . potassium chloride SA (K-DUR,KLOR-CON) 20 MEQ tablet Take 20 mEq by mouth 2 (two) times daily.    . pravastatin (PRAVACHOL) 40 MG tablet Take 1 tablet (40 mg total) by mouth every evening. 90 tablet 3  . tadalafil (CIALIS) 20 MG tablet Take 20 mg by mouth daily as needed for erectile dysfunction.    . traZODone (DESYREL) 100 MG tablet Take 100 mg by mouth at bedtime.     . VENTOLIN HFA 108 (90 Base) MCG/ACT inhaler Inhale 1 puff into the lungs every 4 (four) hours as needed for wheezing or shortness of breath.     . nitroGLYCERIN (NITROSTAT) 0.4 MG SL tablet Place 1 tablet (0.4 mg total) under the tongue every 5 (five) minutes as needed for chest pain. (Patient not taking: Reported on 08/11/2016) 25 tablet 3   No current facility-administered medications for this encounter.     BP 122/72   Pulse 95   Wt 219 lb (99.3 kg)   SpO2 95%   BMI 31.42 kg/m  General: NAD. Ambulated in the clinic. Wife present. Neck: Thick, JVP 5-6cm, no thyromegaly or thyroid nodule.  Lungs: Clear to auscultation bilaterally with normal respiratory effort. CV: Nondisplaced PMI.  Heart  regular S1/S2, no S3/S4, no murmur.  No edema.  No carotid bruit.  Normal pedal pulses.  Abdomen: Soft, nontender, no hepatosplenomegaly, no distention.  Neurologic: Alert and oriented x 3.  Psych: Normal affect. Extremities: No clubbing or cyanosis.   Assessment/Plan: 1. S/p mitral valve repair: Doing well so far. Post-op echo in 8/17 showed elevated mean gradient across the valve but normal pressure half-time (?high flow).  - Off warfarin 11/17.  Continue ASA 81.    2. CAD: Most recent cardiac cath showed stable coronary anatomy.  He has severe stenosis in a distal D1 that is not amenable to intervention.  -  Continue ASA 81,Coreg.   3. Hyperlipidemia: Myalgias with Crestor and atorvastatin.  I will start him on pravastatin 40 mg daily with lipids/LFTs in 2 months.  If he cannot tolerate this, will need Repatha.    4. Chronic diastolic CHF: Volume status looks good today.  LVH and diastolic dysfunction on last echo.  - Volume status stable. Continue Lasix 80 mg in am and cut back lpm dose to 40 mg .  -Check BMET today.   5. OSA: Continue CPAP.  6. Palpitations- Set up for 2 week event monitor.   Follow up in 3 months.   Tully Burgo NP-C  08/11/2016

## 2016-08-11 NOTE — Progress Notes (Signed)
Advanced Heart Failure Medication Review by a Pharmacist  Does the patient  feel that his/her medications are working for him/her?  yes  Has the patient been experiencing any side effects to the medications prescribed?  no  Does the patient measure his/her own blood pressure or blood glucose at home?  yes   Does the patient have any problems obtaining medications due to transportation or finances?   no  Understanding of regimen: good Understanding of indications: good Potential of compliance: good Patient understands to avoid NSAIDs. Patient understands to avoid decongestants.  Issues to address at subsequent visits: None   Pharmacist comments:  [name] is a pleasant 70 yo M presenting with his wife and without a medication list. He reports good compliance with his regimen but states that since Friday he has been taking furosemide 80 mg BID instead of 80 qam and 40 qpm for increased SOB and weight gain. No other medication-related questions or concerns for me at this time.   Ruta Hinds. Velva Harman, PharmD, BCPS, CPP Clinical Pharmacist Pager: [phone] Phone: [phone] 08/11/2016 2:09 PM      Time with patient: 10 minutes Preparation and documentation time:  2 minutes Total time: 12 minutes

## 2016-08-11 NOTE — Patient Instructions (Addendum)
Lasix 80 mg (2 tabs) in am and 40 mg (1 tab) in pm.  Routine lab work today. Will notify you of abnormal results, otherwise no news is good news!  Follow up with Dr. Aundra Dubin 3 months.  Merry Christmas and Happy New Year!  Do the following things EVERYDAY: 1) Weigh yourself in the morning before breakfast. Write it down and keep it in a log. 2) Take your medicines as prescribed 3) Eat low salt foods-Limit salt (sodium) to 2000 mg per day.  4) Stay as active as you can everyday 5) Limit all fluids for the day to less than 2 liters

## 2016-08-12 ENCOUNTER — Encounter: Payer: Self-pay | Admitting: Cardiology

## 2016-08-12 ENCOUNTER — Telehealth: Payer: Self-pay | Admitting: *Deleted

## 2016-08-12 ENCOUNTER — Telehealth (HOSPITAL_COMMUNITY): Payer: Self-pay

## 2016-08-12 ENCOUNTER — Encounter (HOSPITAL_COMMUNITY)
Admission: RE | Admit: 2016-08-12 | Discharge: 2016-08-12 | Disposition: A | Discharge status: Home / Self Care | Payer: Medicare Other | Source: Ambulatory Visit | Attending: Cardiology | Admitting: Cardiology

## 2016-08-12 DIAGNOSIS — I2510 Atherosclerotic heart disease of native coronary artery without angina pectoris: Secondary | ICD-10-CM

## 2016-08-12 DIAGNOSIS — Z9889 Other specified postprocedural states: Secondary | ICD-10-CM | POA: Diagnosis not present

## 2016-08-12 NOTE — Telephone Encounter (Signed)
Basic metabolic panel  Order: [id number]  Status:  Final result Visible to patient:  Yes (MyChart) Dx:  Chronic systolic heart failure ([place])  Notes Recorded by [name], RN on 08/12/2016 at 11:32 AM EST Patient aware and agreeable. Patient endorses will call his PCP to follow up. ------  Notes Recorded by Conrad Carrier Mills, NP on 08/11/2016 at 7:45 PM EST Renal function elevated. Lasix was cut back today at [place]. Glucose elevated. He needs follow up with PCP for elevated glucose. Please call.

## 2016-08-12 NOTE — Telephone Encounter (Signed)
Notes Recorded by [name], MD on 08/09/2016   Good lipids except high triglycerides. Add fish oil 2 g bid, recheck lipids in 2 months.

## 2016-08-14 ENCOUNTER — Encounter (HOSPITAL_COMMUNITY)
Admission: RE | Admit: 2016-08-14 | Discharge: 2016-08-14 | Disposition: A | Discharge status: Home / Self Care | Payer: Medicare Other | Source: Ambulatory Visit | Attending: Cardiology | Admitting: Cardiology

## 2016-08-14 VITALS — Ht 67.75 in | Wt 218.3 lb

## 2016-08-14 DIAGNOSIS — Z9889 Other specified postprocedural states: Secondary | ICD-10-CM | POA: Diagnosis not present

## 2016-08-19 ENCOUNTER — Encounter (HOSPITAL_COMMUNITY)
Admission: RE | Admit: 2016-08-19 | Discharge: 2016-08-19 | Disposition: A | Discharge status: Home / Self Care | Payer: Medicare Other | Source: Ambulatory Visit | Attending: Cardiology | Admitting: Cardiology

## 2016-08-19 DIAGNOSIS — Z9889 Other specified postprocedural states: Secondary | ICD-10-CM | POA: Diagnosis not present

## 2016-08-19 LAB — GLUCOSE, CAPILLARY: Glucose-Capillary: 158 mg/dL — ABNORMAL HIGH (ref 65–99)

## 2016-08-20 ENCOUNTER — Other Ambulatory Visit: Payer: Self-pay | Admitting: Physician Assistant

## 2016-08-20 DIAGNOSIS — J069 Acute upper respiratory infection, unspecified: Secondary | ICD-10-CM | POA: Diagnosis not present

## 2016-08-21 ENCOUNTER — Encounter (HOSPITAL_COMMUNITY)
Admission: RE | Admit: 2016-08-21 | Discharge: 2016-08-21 | Disposition: A | Discharge status: Home / Self Care | Payer: Medicare Other | Source: Ambulatory Visit | Attending: Cardiology | Admitting: Cardiology

## 2016-08-21 DIAGNOSIS — Z9889 Other specified postprocedural states: Secondary | ICD-10-CM

## 2016-08-21 NOTE — Progress Notes (Signed)
Discharge Summary  Patient Details  Name: [name] MRN: [id number] Date of Birth: 1946-04-26 Referring Provider:   Flowsheet Row CARDIAC REHAB PHASE II ORIENTATION from 05/07/2016 in [facility]  Referring Provider  Aundra Dubin, [place],MD       Number of Visits: 31  Reason for Discharge:  Patient independent in their exercise.  Smoking History:  History  Smoking Status  . Never Smoker  Smokeless Tobacco  . Never Used    Diagnosis:  03/25/16 S/P MVR (mitral valve repair)  ADL UCSD:   Initial Exercise Prescription:     Initial Exercise Prescription - 05/07/16 1500      Date of Initial Exercise RX and Referring Provider   Date 05/07/16   Referring Provider Aundra Dubin, [place],MD     Treadmill   MPH 2.6   Grade 0   Minutes 10   METs 2.99     NuStep   Level 3   Minutes 10   METs 2     Cybex   Level 2   Minutes 10   METs 2     Prescription Details   Frequency (times per week) 3   Duration Progress to 30 minutes of continuous aerobic without signs/symptoms of physical distress     Intensity   THRR 40-80% of Max Heartrate 60-120   Ratings of Perceived Exertion 11-13   Perceived Dyspnea 0-4     Progression   Progression Continue to progress workloads to maintain intensity without signs/symptoms of physical distress.     Resistance Training   Training Prescription Yes   Weight 2lbs   Reps 10-12      Discharge Exercise Prescription (Final Exercise Prescription Changes):     Exercise Prescription Changes - 08/27/16 1200      Exercise Review   Progression Yes     Response to Exercise   Blood Pressure (Admit) 104/60   Blood Pressure (Exercise) 122/62   Blood Pressure (Exit) 117/62   Heart Rate (Admit) 73 bpm   Heart Rate (Exercise) 103 bpm   Heart Rate (Exit) 81 bpm   Rating of Perceived Exertion (Exercise) 10   Comments Reviewed home exercise guidelines on 05/18/16.   Duration Progress to 30 minutes of continuous  aerobic without signs/symptoms of physical distress   Intensity THRR unchanged     Progression   Progression Continue to progress workloads to maintain intensity without signs/symptoms of physical distress.   Average METs 5.4     Resistance Training   Training Prescription Yes   Weight 5lbs   Reps 10-12     Treadmill   MPH 3.2   Grade 4   Minutes 20   METs 5.21     NuStep   Level 5   Minutes 10   METs 4     Cybex   Level 3.2   Minutes 10   METs 7     Home Exercise Plan   Plans to continue exercise at Home   Frequency Add 3 additional days to program exercise sessions.      Functional Capacity:     6 Minute Walk    Row Name 05/07/16 1533 08/27/16 1236       6 Minute Walk   Phase Initial Discharge    Distance 1653 feet 1838 feet    Distance % Change  - 11.2 %    Walk Time 6 minutes 6 minutes    # of Rest Breaks 0 0    MPH 3.13  3.5    METS 3.1 3.5    RPE 11 11    VO2 Peak 10.69 12.3    Symptoms No No    Resting HR 80 bpm 98 bpm    Resting BP 120/76 104/70    Max Ex. HR 85 bpm 120 bpm    Max Ex. BP 132/76 112/68    2 Minute Post BP 108/64 104/70       Psychological, QOL, Others - Outcomes: PHQ 2/9: Depression screen [facility] 2/9 08/21/2016 05/11/2016 02/14/2014  Decreased Interest 0 0 0  Down, Depressed, Hopeless 0 0 0  PHQ - 2 Score 0 0 0    Quality of Life:     Quality of Life - 08/27/16 1247      Quality of Life Scores   Health/Function Pre 22 %   Health/Function Post 28.1 %   Health/Function % Change 27.73 %   Socioeconomic Pre 29 %   Socioeconomic Post 30 %   Socioeconomic % Change  3.45 %   Psych/Spiritual Pre 30 %   Psych/Spiritual Post 30 %   Psych/Spiritual % Change 0 %   Family Pre 27.6 %   Family Post 26.7 %   Family % Change -3.26 %   GLOBAL Pre 25.82 %   GLOBAL Post 28.71 %   GLOBAL % Change 11.19 %      Personal Goals: Goals established at orientation with interventions provided to work toward goal.     Personal Goals  and Risk Factors at Admission - 05/07/16 0915      Core Components/Risk Factors/Patient Goals on Admission    Weight Management Yes;Weight Loss   Intervention Weight Management: Develop a combined nutrition and exercise program designed to reach desired caloric intake, while maintaining appropriate intake of nutrient and fiber, sodium and fats, and appropriate energy expenditure required for the weight goal.;Weight Management: Provide education and appropriate resources to help participant work on and attain dietary goals.;Weight Management/Obesity: Establish reasonable short term and long term weight goals.;Obesity: Provide education and appropriate resources to help participant work on and attain dietary goals.   Expected Outcomes Long Term: Adherence to nutrition and physical activity/exercise program aimed toward attainment of established weight goal;Short Term: Continue to assess and modify interventions until short term weight is achieved;Weight Maintenance: Understanding of the daily nutrition guidelines, which includes 25-35% calories from fat, 7% or less cal from saturated fats, less than 264m cholesterol, less than 1.5gm of sodium, & 5 or more servings of fruits and vegetables daily;Weight Loss: Understanding of general recommendations for a balanced deficit meal plan, which promotes 1-2 lb weight loss per week and includes a negative energy balance of [phone] kcal/d;Understanding recommendations for meals to include 15-35% energy as protein, 25-35% energy from fat, 35-60% energy from carbohydrates, less than 2048mof dietary cholesterol, 20-35 gm of total fiber daily;Understanding of distribution of calorie intake throughout the day with the consumption of 4-5 meals/snacks   Sedentary Yes   Intervention Provide advice, education, support and counseling about physical activity/exercise needs.;Develop an individualized exercise prescription for aerobic and resistive training based on initial  evaluation findings, risk stratification, comorbidities and participant's personal goals.   Expected Outcomes Achievement of increased cardiorespiratory fitness and enhanced flexibility, muscular endurance and strength shown through measurements of functional capacity and personal statement of participant.   Increase Strength and Stamina Yes   Intervention Provide advice, education, support and counseling about physical activity/exercise needs.;Develop an individualized exercise prescription for aerobic and resistive training based on initial  evaluation findings, risk stratification, comorbidities and participant's personal goals.   Expected Outcomes Achievement of increased cardiorespiratory fitness and enhanced flexibility, muscular endurance and strength shown through measurements of functional capacity and personal statement of participant.   Diabetes Yes   Intervention Provide education about signs/symptoms and action to take for hypo/hyperglycemia.;Provide education about proper nutrition, including hydration, and aerobic/resistive exercise prescription along with prescribed medications to achieve blood glucose in normal ranges: Fasting glucose 65-99 mg/dL   Expected Outcomes Short Term: Participant verbalizes understanding of the signs/symptoms and immediate care of hyper/hypoglycemia, proper foot care and importance of medication, aerobic/resistive exercise and nutrition plan for blood glucose control.;Long Term: Attainment of HbA1C < 7%.   Personal Goal Other Yes   Personal Goal short: develop an exercise routine   long: gain energy and stamina and get back on track with health and learn limitations   Intervention Provide exercise programming to improve cardiovascular endurance and provide education on cardiac awareness and risk factors   Expected Outcomes Pt will be more aware of cardiac condition, improve strength and stamina and be complaint with exercise routine       Personal Goals  Discharge:     Goals and Risk Factor Review    Row Name 05/29/16 1017 06/10/16 1714 07/29/16 1402 08/27/16 1246       Core Components/Risk Factors/Patient Goals Review   Personal Goals Review Other Other Other Other    Review Participant has established an exercise routine. Currently walking 1 hour at least 3-4 days per week. Pt states he feels like he has gained more strength.  Stretches twice/day, walks 3 miles, 3 days/wk Pt is walking one hour 3days/week in addition to CRPII.  He also sees the Chiropractor 2xs/wk Pt plans to continue exercising at the club 3 days/week and go dancing 3-4 nights/week     Expected Outcomes Maintain regular aerobic and resistance training routine to help improve cardiorespiratory fitness. Maintain regular aerobic and resistance training routine to help improve cardiorespiratory fitness, continue with stretch routine to increase flexibility Maintain regular aerobic and resistance training routine to help improve cardiorespiratory fitness, continue with stretch routine to increase flexibility Maintain regular aerobic and resistance training routine to help improve cardiorespiratory fitness, continue with stretch routine to increase flexibility       Nutrition & Weight - Outcomes:     Pre Biometrics - 05/07/16 1543      Pre Biometrics   Waist Circumference 45 inches   Hip Circumference 41 inches   Waist to Hip Ratio 1.1 %   Triceps Skinfold 9 mm   % Body Fat 30 %   Grip Strength 44 kg   Flexibility 10 in   Single Leg Stand 8.25 seconds         Post Biometrics - 08/27/16 1245       Post  Biometrics   Height 5' 7.75" (1.721 m)   Weight 218 lb 4.1 oz (99 kg)   Waist Circumference 44 inches   Hip Circumference 42.75 inches   Waist to Hip Ratio 1.03 %   BMI (Calculated) 33.5   Triceps Skinfold 5 mm   % Body Fat 27.3 %   Grip Strength 47.5 kg   Flexibility 11 in   Single Leg Stand 7.5 seconds      Nutrition:     Nutrition Therapy & Goals -  05/11/16 0819      Nutrition Therapy   Diet Carb Modified, Therapeutic Lifestyle Changes   Drug/Food Interactions Coumadin/Vit K  Personal Nutrition Goals   Personal Goal #1 1-2 lb wt loss/week to a wt loss goal of 6-24 lb at graduation from Golf Goal #2 Improved glycemic control as evidenced by an A1c of 7 or less     Intervention Plan   Intervention Prescribe, educate and counsel regarding individualized specific dietary modifications aiming towards targeted core components such as weight, hypertension, lipid management, diabetes, heart failure and other comorbidities.   Expected Outcomes Short Term Goal: Understand basic principles of dietary content, such as calories, fat, sodium, cholesterol and nutrients.;Long Term Goal: Adherence to prescribed nutrition plan.      Nutrition Discharge:     Nutrition Assessments - 08/31/16 1158      MEDFICTS Scores   Pre Score 89   Post Score 0   Score Difference -89      Education Questionnaire Score:     Knowledge Questionnaire Score - 08/27/16 1236      Knowledge Questionnaire Score   Post Score 23/24      Goals reviewed with patient; copy given to patient. Braydyn graduated from cardiac rehab program today with completion of 31 exercise sessions in Phase II. Pt maintained good attendance and progressed nicely during his participation in rehab as evidenced by increased MET level.   Medication list reconciled. Repeat  PHQ score- 0 .  Pt has made significant lifestyle changes and should be commended for his success. Pt feels he has achieved his goals during cardiac rehab.   Pt plans to continue exercise by exercising at his gym at home and walking.Harrell Gave RN BSN.

## 2016-08-25 ENCOUNTER — Ambulatory Visit (INDEPENDENT_AMBULATORY_CARE_PROVIDER_SITE_OTHER): Payer: Medicare Other

## 2016-08-25 DIAGNOSIS — I499 Cardiac arrhythmia, unspecified: Secondary | ICD-10-CM

## 2016-08-25 DIAGNOSIS — R000 Tachycardia, unspecified: Secondary | ICD-10-CM

## 2016-08-26 ENCOUNTER — Encounter (HOSPITAL_COMMUNITY): Admission: RE | Admit: 2016-08-26 | Payer: Medicare Other | Source: Ambulatory Visit

## 2016-08-28 ENCOUNTER — Encounter (HOSPITAL_COMMUNITY): Payer: Medicare Other

## 2016-08-31 ENCOUNTER — Encounter (HOSPITAL_COMMUNITY): Payer: Medicare Other

## 2016-08-31 NOTE — Addendum Note (Signed)
Encounter addended by: Jewel Baize, RD on: 08/31/2016 12:00 PM<BR>    Actions taken: Flowsheet data copied forward, Visit Navigator Flowsheet section accepted

## 2016-09-02 ENCOUNTER — Encounter: Payer: Self-pay | Admitting: *Deleted

## 2016-09-02 ENCOUNTER — Encounter (HOSPITAL_COMMUNITY): Payer: Medicare Other

## 2016-09-04 ENCOUNTER — Encounter (HOSPITAL_COMMUNITY): Payer: Medicare Other

## 2016-09-07 ENCOUNTER — Encounter (HOSPITAL_COMMUNITY): Payer: Medicare Other

## 2016-09-16 ENCOUNTER — Other Ambulatory Visit: Payer: Self-pay | Admitting: Physician Assistant

## 2016-09-17 DIAGNOSIS — Z4651 Encounter for fitting and adjustment of gastric lap band: Secondary | ICD-10-CM | POA: Diagnosis not present

## 2016-09-18 ENCOUNTER — Other Ambulatory Visit: Payer: Self-pay | Admitting: Physician Assistant

## 2016-09-19 DIAGNOSIS — R05 Cough: Secondary | ICD-10-CM | POA: Diagnosis not present

## 2016-09-23 ENCOUNTER — Other Ambulatory Visit: Payer: Self-pay | Admitting: *Deleted

## 2016-09-23 ENCOUNTER — Other Ambulatory Visit: Payer: Self-pay | Admitting: Physician Assistant

## 2016-09-23 MED ORDER — CARVEDILOL 3.125 MG PO TABS: 3.1250 mg | ORAL_TABLET | Freq: Two times a day (BID) | ORAL | 2 refills | Status: DC

## 2016-10-06 ENCOUNTER — Telehealth: Payer: Self-pay | Admitting: Cardiology

## 2016-10-06 NOTE — Telephone Encounter (Signed)
As Info

## 2016-10-06 NOTE — Telephone Encounter (Signed)
Clearance for bilateral upper eyelid ptosis and entropion repair faxed to [phone] as requested.  Patient is to hold aspirin for 3 days prior and may restart 1 day postsurgery.

## 2016-10-06 NOTE — Telephone Encounter (Signed)
New Message:   Pt is having surgery on 10-12-16 on his eyelids and his surgeon's office will be contacting you for clearance.

## 2016-10-12 DIAGNOSIS — H02413 Mechanical ptosis of bilateral eyelids: Secondary | ICD-10-CM | POA: Diagnosis not present

## 2016-10-12 DIAGNOSIS — H02004 Unspecified entropion of left upper eyelid: Secondary | ICD-10-CM | POA: Diagnosis not present

## 2016-10-12 DIAGNOSIS — H04162 Lacrimal gland dislocation, left lacrimal gland: Secondary | ICD-10-CM | POA: Diagnosis not present

## 2016-10-12 DIAGNOSIS — H05222 Edema of left orbit: Secondary | ICD-10-CM | POA: Diagnosis not present

## 2016-10-12 DIAGNOSIS — H02024 Mechanical entropion of left upper eyelid: Secondary | ICD-10-CM | POA: Diagnosis not present

## 2016-10-12 DIAGNOSIS — H02021 Mechanical entropion of right upper eyelid: Secondary | ICD-10-CM | POA: Diagnosis not present

## 2016-10-14 ENCOUNTER — Other Ambulatory Visit: Payer: Medicare Other | Admitting: *Deleted

## 2016-10-14 DIAGNOSIS — I2510 Atherosclerotic heart disease of native coronary artery without angina pectoris: Secondary | ICD-10-CM

## 2016-10-14 NOTE — Addendum Note (Signed)
Addended by: Eulis Foster on: 10/14/2016 12:30 PM   Modules accepted: Orders

## 2016-10-15 LAB — LIPID PANEL
CHOLESTEROL TOTAL: 136 mg/dL (ref 100–199)
Chol/HDL Ratio: 3.8 (ref 0.0–5.0)
HDL: 36 mg/dL — AB (ref >39)
LDL Calculated: 76 (ref 0–99)
Triglycerides: 122 mg/dL (ref 0–149)
VLDL CHOLESTEROL CAL: 24 (ref 5–40)

## 2016-10-19 DIAGNOSIS — Z09 Encounter for follow-up examination after completed treatment for conditions other than malignant neoplasm: Secondary | ICD-10-CM | POA: Diagnosis not present

## 2016-10-19 DIAGNOSIS — H02135 Senile ectropion of left lower eyelid: Secondary | ICD-10-CM | POA: Diagnosis not present

## 2016-10-19 DIAGNOSIS — H02132 Senile ectropion of right lower eyelid: Secondary | ICD-10-CM | POA: Diagnosis not present

## 2016-11-18 ENCOUNTER — Ambulatory Visit: Payer: Self-pay | Admitting: Pharmacist

## 2016-11-18 DIAGNOSIS — Z5181 Encounter for therapeutic drug level monitoring: Secondary | ICD-10-CM

## 2016-11-18 DIAGNOSIS — Z9889 Other specified postprocedural states: Secondary | ICD-10-CM

## 2016-11-23 DIAGNOSIS — R05 Cough: Secondary | ICD-10-CM | POA: Diagnosis not present

## 2016-11-23 DIAGNOSIS — J069 Acute upper respiratory infection, unspecified: Secondary | ICD-10-CM | POA: Diagnosis not present

## 2016-12-25 DIAGNOSIS — M5137 Other intervertebral disc degeneration, lumbosacral region: Secondary | ICD-10-CM | POA: Diagnosis not present

## 2016-12-25 DIAGNOSIS — M9902 Segmental and somatic dysfunction of thoracic region: Secondary | ICD-10-CM | POA: Diagnosis not present

## 2016-12-25 DIAGNOSIS — M9904 Segmental and somatic dysfunction of sacral region: Secondary | ICD-10-CM | POA: Diagnosis not present

## 2016-12-25 DIAGNOSIS — M9903 Segmental and somatic dysfunction of lumbar region: Secondary | ICD-10-CM | POA: Diagnosis not present

## 2016-12-25 DIAGNOSIS — M5386 Other specified dorsopathies, lumbar region: Secondary | ICD-10-CM | POA: Diagnosis not present

## 2016-12-25 DIAGNOSIS — M5414 Radiculopathy, thoracic region: Secondary | ICD-10-CM | POA: Diagnosis not present

## 2016-12-29 DIAGNOSIS — M5386 Other specified dorsopathies, lumbar region: Secondary | ICD-10-CM | POA: Diagnosis not present

## 2016-12-29 DIAGNOSIS — M5137 Other intervertebral disc degeneration, lumbosacral region: Secondary | ICD-10-CM | POA: Diagnosis not present

## 2016-12-29 DIAGNOSIS — M9905 Segmental and somatic dysfunction of pelvic region: Secondary | ICD-10-CM | POA: Diagnosis not present

## 2016-12-29 DIAGNOSIS — M9902 Segmental and somatic dysfunction of thoracic region: Secondary | ICD-10-CM | POA: Diagnosis not present

## 2016-12-29 DIAGNOSIS — M5415 Radiculopathy, thoracolumbar region: Secondary | ICD-10-CM | POA: Diagnosis not present

## 2016-12-29 DIAGNOSIS — M9903 Segmental and somatic dysfunction of lumbar region: Secondary | ICD-10-CM | POA: Diagnosis not present

## 2016-12-31 DIAGNOSIS — M5137 Other intervertebral disc degeneration, lumbosacral region: Secondary | ICD-10-CM | POA: Diagnosis not present

## 2016-12-31 DIAGNOSIS — M5386 Other specified dorsopathies, lumbar region: Secondary | ICD-10-CM | POA: Diagnosis not present

## 2016-12-31 DIAGNOSIS — M9902 Segmental and somatic dysfunction of thoracic region: Secondary | ICD-10-CM | POA: Diagnosis not present

## 2016-12-31 DIAGNOSIS — M9905 Segmental and somatic dysfunction of pelvic region: Secondary | ICD-10-CM | POA: Diagnosis not present

## 2016-12-31 DIAGNOSIS — M5415 Radiculopathy, thoracolumbar region: Secondary | ICD-10-CM | POA: Diagnosis not present

## 2016-12-31 DIAGNOSIS — M9903 Segmental and somatic dysfunction of lumbar region: Secondary | ICD-10-CM | POA: Diagnosis not present

## 2017-01-05 DIAGNOSIS — M5386 Other specified dorsopathies, lumbar region: Secondary | ICD-10-CM | POA: Diagnosis not present

## 2017-01-05 DIAGNOSIS — M5137 Other intervertebral disc degeneration, lumbosacral region: Secondary | ICD-10-CM | POA: Diagnosis not present

## 2017-01-05 DIAGNOSIS — M9905 Segmental and somatic dysfunction of pelvic region: Secondary | ICD-10-CM | POA: Diagnosis not present

## 2017-01-05 DIAGNOSIS — M5415 Radiculopathy, thoracolumbar region: Secondary | ICD-10-CM | POA: Diagnosis not present

## 2017-01-05 DIAGNOSIS — M9903 Segmental and somatic dysfunction of lumbar region: Secondary | ICD-10-CM | POA: Diagnosis not present

## 2017-01-05 DIAGNOSIS — M9902 Segmental and somatic dysfunction of thoracic region: Secondary | ICD-10-CM | POA: Diagnosis not present

## 2017-01-07 DIAGNOSIS — M9902 Segmental and somatic dysfunction of thoracic region: Secondary | ICD-10-CM | POA: Diagnosis not present

## 2017-01-07 DIAGNOSIS — M5415 Radiculopathy, thoracolumbar region: Secondary | ICD-10-CM | POA: Diagnosis not present

## 2017-01-07 DIAGNOSIS — M5137 Other intervertebral disc degeneration, lumbosacral region: Secondary | ICD-10-CM | POA: Diagnosis not present

## 2017-01-07 DIAGNOSIS — M9903 Segmental and somatic dysfunction of lumbar region: Secondary | ICD-10-CM | POA: Diagnosis not present

## 2017-01-07 DIAGNOSIS — M5386 Other specified dorsopathies, lumbar region: Secondary | ICD-10-CM | POA: Diagnosis not present

## 2017-01-07 DIAGNOSIS — M9905 Segmental and somatic dysfunction of pelvic region: Secondary | ICD-10-CM | POA: Diagnosis not present

## 2017-01-14 DIAGNOSIS — M9905 Segmental and somatic dysfunction of pelvic region: Secondary | ICD-10-CM | POA: Diagnosis not present

## 2017-01-14 DIAGNOSIS — M9902 Segmental and somatic dysfunction of thoracic region: Secondary | ICD-10-CM | POA: Diagnosis not present

## 2017-01-14 DIAGNOSIS — M9903 Segmental and somatic dysfunction of lumbar region: Secondary | ICD-10-CM | POA: Diagnosis not present

## 2017-01-14 DIAGNOSIS — M5137 Other intervertebral disc degeneration, lumbosacral region: Secondary | ICD-10-CM | POA: Diagnosis not present

## 2017-01-14 DIAGNOSIS — M5415 Radiculopathy, thoracolumbar region: Secondary | ICD-10-CM | POA: Diagnosis not present

## 2017-01-14 DIAGNOSIS — M5386 Other specified dorsopathies, lumbar region: Secondary | ICD-10-CM | POA: Diagnosis not present

## 2017-01-20 DIAGNOSIS — J069 Acute upper respiratory infection, unspecified: Secondary | ICD-10-CM | POA: Diagnosis not present

## 2017-01-20 DIAGNOSIS — R05 Cough: Secondary | ICD-10-CM | POA: Diagnosis not present

## 2017-01-20 DIAGNOSIS — M545 Low back pain: Secondary | ICD-10-CM | POA: Diagnosis not present

## 2017-01-25 DIAGNOSIS — M9903 Segmental and somatic dysfunction of lumbar region: Secondary | ICD-10-CM | POA: Diagnosis not present

## 2017-01-25 DIAGNOSIS — M9905 Segmental and somatic dysfunction of pelvic region: Secondary | ICD-10-CM | POA: Diagnosis not present

## 2017-01-25 DIAGNOSIS — M5137 Other intervertebral disc degeneration, lumbosacral region: Secondary | ICD-10-CM | POA: Diagnosis not present

## 2017-01-25 DIAGNOSIS — M5415 Radiculopathy, thoracolumbar region: Secondary | ICD-10-CM | POA: Diagnosis not present

## 2017-01-25 DIAGNOSIS — M5386 Other specified dorsopathies, lumbar region: Secondary | ICD-10-CM | POA: Diagnosis not present

## 2017-01-25 DIAGNOSIS — M9902 Segmental and somatic dysfunction of thoracic region: Secondary | ICD-10-CM | POA: Diagnosis not present

## 2017-02-02 DIAGNOSIS — M5386 Other specified dorsopathies, lumbar region: Secondary | ICD-10-CM | POA: Diagnosis not present

## 2017-02-02 DIAGNOSIS — M9905 Segmental and somatic dysfunction of pelvic region: Secondary | ICD-10-CM | POA: Diagnosis not present

## 2017-02-02 DIAGNOSIS — M9903 Segmental and somatic dysfunction of lumbar region: Secondary | ICD-10-CM | POA: Diagnosis not present

## 2017-02-02 DIAGNOSIS — M9902 Segmental and somatic dysfunction of thoracic region: Secondary | ICD-10-CM | POA: Diagnosis not present

## 2017-02-02 DIAGNOSIS — Z7984 Long term (current) use of oral hypoglycemic drugs: Secondary | ICD-10-CM | POA: Diagnosis not present

## 2017-02-02 DIAGNOSIS — N5201 Erectile dysfunction due to arterial insufficiency: Secondary | ICD-10-CM | POA: Diagnosis not present

## 2017-02-02 DIAGNOSIS — E119 Type 2 diabetes mellitus without complications: Secondary | ICD-10-CM | POA: Diagnosis not present

## 2017-02-02 DIAGNOSIS — M5137 Other intervertebral disc degeneration, lumbosacral region: Secondary | ICD-10-CM | POA: Diagnosis not present

## 2017-02-02 DIAGNOSIS — I10 Essential (primary) hypertension: Secondary | ICD-10-CM | POA: Diagnosis not present

## 2017-02-02 DIAGNOSIS — M5415 Radiculopathy, thoracolumbar region: Secondary | ICD-10-CM | POA: Diagnosis not present

## 2017-02-12 DIAGNOSIS — Z4651 Encounter for fitting and adjustment of gastric lap band: Secondary | ICD-10-CM | POA: Diagnosis not present

## 2017-02-15 ENCOUNTER — Ambulatory Visit: Payer: Medicare Other | Admitting: Thoracic Surgery (Cardiothoracic Vascular Surgery)

## 2017-02-16 DIAGNOSIS — M9905 Segmental and somatic dysfunction of pelvic region: Secondary | ICD-10-CM | POA: Diagnosis not present

## 2017-02-16 DIAGNOSIS — M5386 Other specified dorsopathies, lumbar region: Secondary | ICD-10-CM | POA: Diagnosis not present

## 2017-02-16 DIAGNOSIS — M9902 Segmental and somatic dysfunction of thoracic region: Secondary | ICD-10-CM | POA: Diagnosis not present

## 2017-02-16 DIAGNOSIS — M9903 Segmental and somatic dysfunction of lumbar region: Secondary | ICD-10-CM | POA: Diagnosis not present

## 2017-02-16 DIAGNOSIS — M5137 Other intervertebral disc degeneration, lumbosacral region: Secondary | ICD-10-CM | POA: Diagnosis not present

## 2017-02-16 DIAGNOSIS — M5415 Radiculopathy, thoracolumbar region: Secondary | ICD-10-CM | POA: Diagnosis not present

## 2017-03-16 DIAGNOSIS — M9903 Segmental and somatic dysfunction of lumbar region: Secondary | ICD-10-CM | POA: Diagnosis not present

## 2017-03-16 DIAGNOSIS — M5386 Other specified dorsopathies, lumbar region: Secondary | ICD-10-CM | POA: Diagnosis not present

## 2017-03-16 DIAGNOSIS — M9902 Segmental and somatic dysfunction of thoracic region: Secondary | ICD-10-CM | POA: Diagnosis not present

## 2017-03-16 DIAGNOSIS — M5137 Other intervertebral disc degeneration, lumbosacral region: Secondary | ICD-10-CM | POA: Diagnosis not present

## 2017-03-16 DIAGNOSIS — M5415 Radiculopathy, thoracolumbar region: Secondary | ICD-10-CM | POA: Diagnosis not present

## 2017-03-16 DIAGNOSIS — M9905 Segmental and somatic dysfunction of pelvic region: Secondary | ICD-10-CM | POA: Diagnosis not present

## 2017-04-05 ENCOUNTER — Ambulatory Visit: Payer: Medicare Other | Admitting: Thoracic Surgery (Cardiothoracic Vascular Surgery)

## 2017-04-08 DIAGNOSIS — M9902 Segmental and somatic dysfunction of thoracic region: Secondary | ICD-10-CM | POA: Diagnosis not present

## 2017-04-08 DIAGNOSIS — M5386 Other specified dorsopathies, lumbar region: Secondary | ICD-10-CM | POA: Diagnosis not present

## 2017-04-08 DIAGNOSIS — M5137 Other intervertebral disc degeneration, lumbosacral region: Secondary | ICD-10-CM | POA: Diagnosis not present

## 2017-04-08 DIAGNOSIS — M5415 Radiculopathy, thoracolumbar region: Secondary | ICD-10-CM | POA: Diagnosis not present

## 2017-04-08 DIAGNOSIS — M9903 Segmental and somatic dysfunction of lumbar region: Secondary | ICD-10-CM | POA: Diagnosis not present

## 2017-04-08 DIAGNOSIS — M9905 Segmental and somatic dysfunction of pelvic region: Secondary | ICD-10-CM | POA: Diagnosis not present

## 2017-04-12 ENCOUNTER — Ambulatory Visit (INDEPENDENT_AMBULATORY_CARE_PROVIDER_SITE_OTHER): Payer: Medicare Other | Admitting: Thoracic Surgery (Cardiothoracic Vascular Surgery)

## 2017-04-12 ENCOUNTER — Encounter: Payer: Self-pay | Admitting: Thoracic Surgery (Cardiothoracic Vascular Surgery)

## 2017-04-12 VITALS — BP 128/78 | HR 64 | Resp 20 | Ht 67.75 in | Wt 222.0 lb

## 2017-04-12 DIAGNOSIS — I2510 Atherosclerotic heart disease of native coronary artery without angina pectoris: Secondary | ICD-10-CM | POA: Diagnosis not present

## 2017-04-12 DIAGNOSIS — Z9889 Other specified postprocedural states: Secondary | ICD-10-CM | POA: Diagnosis not present
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
## 2017-04-12 NOTE — Progress Notes (Signed)
[name], can you work [name] in to see me this week or early next week? He is having some exertional chest pain probably need cath.

## 2017-04-12 NOTE — Patient Instructions (Addendum)
Continue all previous medications without any changes at this time  Call your cardiologist and/or go directly to the emergency room if you develop chest discomfort unrelieved by rest or nitroglycerin  Endocarditis is a potentially serious infection of heart valves or inside lining of the heart.  It occurs more commonly in patients with diseased heart valves (such as patient's with aortic or mitral valve disease) and in patients who have undergone heart valve repair or replacement.  Certain surgical and dental procedures may put you at risk, such as dental cleaning, other dental procedures, or any surgery involving the respiratory, urinary, gastrointestinal tract, gallbladder or prostate gland.   To minimize your chances for develooping endocarditis, maintain good oral health and seek prompt medical attention for any infections involving the mouth, teeth, gums, skin or urinary tract.    Always notify your doctor or dentist about your underlying heart valve condition before having any invasive procedures. You will need to take antibiotics before certain procedures, including all routine dental cleanings or other dental procedures.  Your cardiologist or dentist should prescribe these antibiotics for you to be taken ahead of time.

## 2017-04-13 ENCOUNTER — Other Ambulatory Visit: Payer: Self-pay | Admitting: Cardiology

## 2017-04-13 ENCOUNTER — Other Ambulatory Visit (HOSPITAL_COMMUNITY): Payer: Self-pay | Admitting: Cardiology

## 2017-04-13 DIAGNOSIS — E785 Hyperlipidemia, unspecified: Secondary | ICD-10-CM

## 2017-04-13 DIAGNOSIS — R079 Chest pain, unspecified: Secondary | ICD-10-CM

## 2017-04-13 DIAGNOSIS — I208 Other forms of angina pectoris: Secondary | ICD-10-CM

## 2017-04-13 DIAGNOSIS — I2510 Atherosclerotic heart disease of native coronary artery without angina pectoris: Secondary | ICD-10-CM

## 2017-04-19 ENCOUNTER — Encounter (HOSPITAL_COMMUNITY): Payer: Self-pay | Admitting: Cardiology

## 2017-04-19 ENCOUNTER — Encounter (HOSPITAL_COMMUNITY): Payer: Self-pay

## 2017-04-19 ENCOUNTER — Ambulatory Visit (HOSPITAL_COMMUNITY)
Admission: RE | Admit: 2017-04-19 | Discharge: 2017-04-19 | Disposition: A | Discharge status: Home / Self Care | Payer: Medicare Other | Source: Ambulatory Visit | Attending: Cardiology | Admitting: Cardiology

## 2017-04-19 VITALS — BP 117/82 | HR 74 | Wt 224.2 lb

## 2017-04-19 DIAGNOSIS — I5032 Chronic diastolic (congestive) heart failure: Secondary | ICD-10-CM | POA: Diagnosis not present

## 2017-04-19 DIAGNOSIS — E785 Hyperlipidemia, unspecified: Secondary | ICD-10-CM | POA: Insufficient documentation

## 2017-04-19 DIAGNOSIS — I2510 Atherosclerotic heart disease of native coronary artery without angina pectoris: Secondary | ICD-10-CM | POA: Insufficient documentation

## 2017-04-19 DIAGNOSIS — Z8673 Personal history of transient ischemic attack (TIA), and cerebral infarction without residual deficits: Secondary | ICD-10-CM | POA: Diagnosis not present

## 2017-04-19 DIAGNOSIS — Z7984 Long term (current) use of oral hypoglycemic drugs: Secondary | ICD-10-CM | POA: Insufficient documentation

## 2017-04-19 DIAGNOSIS — N183 Chronic kidney disease, stage 3 (moderate): Secondary | ICD-10-CM | POA: Insufficient documentation

## 2017-04-19 DIAGNOSIS — Z823 Family history of stroke: Secondary | ICD-10-CM | POA: Diagnosis not present

## 2017-04-19 DIAGNOSIS — I340 Nonrheumatic mitral (valve) insufficiency: Secondary | ICD-10-CM | POA: Insufficient documentation

## 2017-04-19 DIAGNOSIS — I209 Angina pectoris, unspecified: Secondary | ICD-10-CM

## 2017-04-19 DIAGNOSIS — I25810 Atherosclerosis of coronary artery bypass graft(s) without angina pectoris: Secondary | ICD-10-CM

## 2017-04-19 DIAGNOSIS — I130 Hypertensive heart and chronic kidney disease with heart failure and stage 1 through stage 4 chronic kidney disease, or unspecified chronic kidney disease: Secondary | ICD-10-CM | POA: Diagnosis not present

## 2017-04-19 DIAGNOSIS — Z9889 Other specified postprocedural states: Secondary | ICD-10-CM | POA: Diagnosis not present

## 2017-04-19 DIAGNOSIS — Z951 Presence of aortocoronary bypass graft: Secondary | ICD-10-CM | POA: Diagnosis not present

## 2017-04-19 DIAGNOSIS — G4733 Obstructive sleep apnea (adult) (pediatric): Secondary | ICD-10-CM | POA: Diagnosis not present

## 2017-04-19 DIAGNOSIS — Z9884 Bariatric surgery status: Secondary | ICD-10-CM | POA: Diagnosis not present

## 2017-04-19 DIAGNOSIS — Z7982 Long term (current) use of aspirin: Secondary | ICD-10-CM | POA: Insufficient documentation

## 2017-04-19 DIAGNOSIS — E1122 Type 2 diabetes mellitus with diabetic chronic kidney disease: Secondary | ICD-10-CM | POA: Diagnosis not present

## 2017-04-19 DIAGNOSIS — E669 Obesity, unspecified: Secondary | ICD-10-CM | POA: Diagnosis not present

## 2017-04-19 LAB — BASIC METABOLIC PANEL
ANION GAP: 9 (ref 5–15)
BUN: 13 mg/dL (ref 6–20)
CALCIUM: 9.3 mg/dL (ref 8.9–10.3)
CO2: 29 mmol/L (ref 22–32)
CREATININE: 1.88 mg/dL — AB (ref 0.61–1.24)
Chloride: 102 mmol/L (ref 101–111)
GFR, EST AFRICAN AMERICAN: 40 mL/min — AB (ref >60)
GFR, EST NON AFRICAN AMERICAN: 34 mL/min — AB (ref >60)
Glucose, Bld: 141 mg/dL — ABNORMAL HIGH (ref 65–99)
Potassium: 4.2 mmol/L (ref 3.5–5.1)
SODIUM: 140 mmol/L (ref 135–145)

## 2017-04-19 LAB — CBC
HCT: 40.4 % (ref 39.0–52.0)
HEMOGLOBIN: 12.9 g/dL — AB (ref 13.0–17.0)
MCH: 28 pg (ref 26.0–34.0)
MCHC: 31.9 g/dL (ref 30.0–36.0)
MCV: 87.8 fL (ref 78.0–100.0)
Platelets: 205 10*3/uL (ref 150–400)
RBC: 4.6 MIL/uL (ref 4.22–5.81)
RDW: 15.2 % (ref 11.5–15.5)
WBC: 7.9 10*3/uL (ref 4.0–10.5)

## 2017-04-19 LAB — PROTIME-INR
INR: 1.09
PROTHROMBIN TIME: 14.2 s (ref 11.4–15.2)

## 2017-04-19 MED ORDER — FUROSEMIDE 40 MG PO TABS: ORAL_TABLET | ORAL | 3 refills | Status: DC

## 2017-04-19 MED ORDER — ISOSORBIDE MONONITRATE ER 30 MG PO TB24: 30.0000 mg | ORAL_TABLET | Freq: Every day | ORAL | 3 refills | Status: DC

## 2017-04-19 NOTE — Patient Instructions (Addendum)
Labs done today   Stop Cialis  Start Furosemide 80 mg (4 tabs) in am, then 40 mg (2 tabs) in pm  Start Isorbide mononitrate (IMUDUR) 30mg  (1 tab) daily   Have Echo:  Your physician has requested that you have an echocardiogram. Echocardiography is a painless test that uses sound waves to create images of your heart. It provides your doctor with information about the size and shape of your heart and how well your heart's chambers and valves are working. This procedure takes approximately one hour. There are no restrictions for this procedure.  Left heart Cath:  Your physician has requested that you have a cardiac catheterization. Cardiac catheterization is used to diagnose and/or treat various heart conditions. Doctors may recommend this procedure for a number of different reasons. The most common reason is to evaluate chest pain. Chest pain can be a symptom of coronary artery disease (CAD), and cardiac catheterization can show whether plaque is narrowing or blocking your heart's arteries. This procedure is also used to evaluate the valves, as well as measure the blood flow and oxygen levels in different parts of your heart. For further information please visit [email or web address]. Please follow instruction sheet, as given.  Your physician recommends that you schedule a follow-up appointment in: 3-4 weeks

## 2017-04-19 NOTE — Progress Notes (Signed)
Patient ID: [name], male   DOB: 1946/03/25, 71 y.o.   MRN: [id number] PCP: Dr Harrington Challenger Cardiology: Dr. Aundra Dubin  71 y.o. with history of CAD s/p CABG and obesity s/p lap banding procedure presents for cardiology followup.  Given exertional dyspnea and chest tightness, he had LHC in 1/15.  This showed no change from prior.  Grafts were patent and there was severe stenosis in distal D1, not amenable to PCI.   In 2017, he developed increased exertional dyspnea.  Eventually had a TEE showing severe MR with prolapse of a portion of the anterior mitral valve leaflet.  Coronary angiography in 6/17 showed patent grafts and 90% stenosis of distal diagonal not amenable to intervention.  In 8/17, he had mitral valve repair.  Post-op diastolic CHF, Lasix started and increased.   He was doing well until a few weeks ago.  He started to develop tightness in his chest with exertion. This has generally been mild.  He will get it walking up a flight of stairs or walking across his large yard.  Pain will resolve quickly with rest.  He has not taken NTG.  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  About 1 week ago, he had a bad episode of chest pain.  He was dancing with his wife and had severe central chest pain along with dyspnea.  He sat down, and pain resolved after 10-15 minutes. This was the most severe episode he had had.  No palpitations. He recently increased Lasix on his own to 80 mg bid because of increased exertional dyspnea.   ECG (personally reviewed): NSR, left axis deviation  Labs (11/12): LDL 70 Labs (5/13): K 4.1, creatinine 1.1, BNP 48 Labs (12/13): K 3.4, creatinine 1.07, LDL 59, HDL 37 Labs (1/15): K 4.3, creatinine 1.2, HCT 40.3 Labs (3/15): LDL 38, HDL 81, BNP 55, K 4, creatinine 1.2 Labs (6/15): K 4.2, creatinine 1.4, LFTs normal, LDL 66, HDL 35 Labs (8/16): K 4, creatinine 1.6, BNP 64 Labs (12/16): LDL 61, HDL 37 Labs (4/17): K  4.2, creatinine 1.35, HCT 38.7, BNP 80 Labs (8/17): K 4.2, creatinine 1.4 => 1.66, BNP 159 Labs (12/17): K 3.6, creatinine 1.75 Labs (2/18): LDL 76, HDL 36  PMH: 1. OSA: on CPAP.  2. Diabetes mellitus 3. Allergic rhinitis 4. CAD: s/p CABG 1995.  LHC (10/11) with 90% distal D1 (patent proximal D1 stent), total occlusion CFX, total occlusion RCA, no significant stenoses in LAD, SVG-PDA patent, SVG-OM patent, EF 55%.  Medical management.  LHC (5/13) with patent grafts and 90% distal D1 stenosis (no significant change from 10/11).  LHC (1/15) with patent grafts, 75% ISR in D1 stent, 90% stenosis distal D1 (small caliber). D1 not amenable to intervention.  - Coronary angiography (6/17): grafts patent, 90% stenosis distally in large diagonal not amenable to intervention.   5. Obesity: lap-banding in 5/11.  6. Chronic diastolic CHF: Echo (0/34) with EF 55-60%, moderate LVH, moderate diastolic dysfunction, s/p mitral valve repair with elevated mean gradient but normal pressure half-time.  7. TIA: Carotid dopplers in 11/11 with 0-39% bilateral stenosis.  Carotid dopplers 6/17 with 1-39% RICA stenosis.  8. OA: Knees, C-spine. TKR 9/13.  9. HTN 10. Hyperlipidemia: Myalgias with atorvastatin and Crestor.  11. Diverticulosis 12. MItral regurgitation: TEE (6/17) with EF 55-60%, severe eccentric MR, prolapse of a portion of the anterior leaflet, peak RV-RA gradient 45 mmHg.  - Mitral valve repair 8/17 13.  CKD: Stage III.  14. Event monitor (1/18): Occasional PVCs, no significant abnormalities.   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  Lives in [place].   Family History  Problem Relation Age of Onset  . Other Mother        joint problems  . Hypertension Father   . Heart disease Father   . CVA Father   . Depression Father   . Heart disease Sister        CABG  . Stroke Sister   . Hypertension Sister   . Congestive Heart Failure Sister   . Diabetes Sister   . Heart disease  Brother   . Hypertension Brother   . Congestive Heart Failure Brother     ROS: All systems reviewed and negative except as per HPI.    Current Outpatient Prescriptions  Medication Sig Dispense Refill  . amoxicillin (AMOXIL) 500 MG tablet Take 4 tablets (2 g) by mouth 30-60 minutes prior to dental appointment 12 tablet 0  . aspirin EC 81 MG tablet Take 81 mg by mouth daily.    . carvedilol (COREG) 3.125 MG tablet Take 1 tablet (3.125 mg total) by mouth 2 (two) times daily. 180 tablet 2  . cetirizine (ZYRTEC) 10 MG chewable tablet Chew 10 mg by mouth as needed.     . Chlorpheniramine-DM (CORICIDIN HBP COUGH/COLD PO) Take 1 tablet by mouth daily as needed (cold).    . Cholecalciferol (VITAMIN D) 2000 units CAPS Take 2,000 Units by mouth daily.    . fluticasone (FLONASE) 50 MCG/ACT nasal spray Place 1 spray into both nostrils daily as needed for allergies or rhinitis.    . furosemide (LASIX) 40 MG tablet Take 80 mg (2 tabs) in am and 40 mg (1 tab) in pm 90 tablet 3  . HYDROcodone-acetaminophen (NORCO) 10-325 MG tablet Take 1 tablet by mouth every 4 (four) hours as needed for moderate pain. 20 tablet 0  . metFORMIN (GLUCOPHAGE) 500 MG tablet Take 500 mg by mouth 2 (two) times daily with a meal.    . nitroGLYCERIN (NITROSTAT) 0.4 MG SL tablet DISSOLVE ONE TABLET UNDER THE TONGUE EVERY 5 MINUTES AS NEEDED FOR CHEST PAIN.  DO NOT EXCEED A TOTAL OF 3 DOSES IN 15 MINUTES 25 tablet 3  . potassium chloride SA (K-DUR,KLOR-CON) 20 MEQ tablet Take 20 mEq by mouth 2 (two) times daily.    . pravastatin (PRAVACHOL) 40 MG tablet Take 1 tablet (40 mg total) by mouth every evening. 90 tablet 3  . traZODone (DESYREL) 100 MG tablet Take 100 mg by mouth at bedtime.     . VENTOLIN HFA 108 (90 Base) MCG/ACT inhaler Inhale 1 puff into the lungs every 4 (four) hours as needed for wheezing or shortness of breath.     . isosorbide mononitrate (IMDUR) 30 MG 24 hr tablet Take 1 tablet (30 mg total) by mouth daily. 30  tablet 3   No current facility-administered medications for this encounter.     BP 117/82   Pulse 74   Wt 224 lb 4 oz (101.7 kg)   SpO2 100%   BMI 34.35 kg/m  General: NAD Neck: JVP 7-8 cm, no thyromegaly or thyroid nodule.  Lungs: Clear to auscultation bilaterally with normal respiratory effort. CV: Nondisplaced PMI.  Heart regular S1/S2, no S3/S4, 2/6 early SEM RUSB.  1+ ankle edema.  No carotid bruit.  Normal pedal pulses.  Abdomen: Soft, nontender, no hepatosplenomegaly, no distention.  Skin: Intact without lesions or  rashes.  Neurologic: Alert and oriented x 3.  Psych: Normal affect. Extremities: No clubbing or cyanosis.  HEENT: Normal.    Assessment/Plan: 1. S/p mitral valve repair:  Post-op echo in 8/17 showed elevated mean gradient across the valve but normal pressure half-time (?high flow).  - I am going to obtain a repeat echo with increased exertional dyspnea and chest pain.    - With prosthetic material, should use antibiotic prophylaxis with dental work.  2. CAD:  From prior cath, he has severe stenosis in a distal D1 that is not amenable to intervention.  Recently, he has developed worsening exertional chest pain and dyspnea that is concerning for angina.   - Continue ASA 81,Coreg, pravastatin.   - Add Imdur 30 mg daily.  He was warned not to take Cialis while getting Imdur.  - Given worrisome symptoms, I think he should have coronary angiography.  We discussed risks/benefits of the test today and he agrees to proceed with it.  Will need to minimize contrast with CKD and prehydrate on arrival to short stay.  I will also have him hold his metformin peri-cath.  3. Hyperlipidemia: Myalgias with Crestor and atorvastatin.  He is now on pravastatin, lipids were acceptable in 2/18.  4. Chronic diastolic CHF: Volume status looks ok today.  LVH and diastolic dysfunction on last echo. Possible that current exertional dyspnea is ischemia-related.  - Continue Lasix 80 qam/40 qpm  (would decrease back to this from Lasix 80 mg bid).  BMET today.  5. OSA: Continue CPAP.   Followup in 4 months.   [name] 04/19/2017

## 2017-04-23 ENCOUNTER — Other Ambulatory Visit (HOSPITAL_COMMUNITY): Payer: Self-pay

## 2017-04-23 ENCOUNTER — Ambulatory Visit (HOSPITAL_COMMUNITY)
Admission: RE | Admit: 2017-04-23 | Discharge: 2017-04-23 | Disposition: A | Discharge status: Home / Self Care | Payer: Medicare Other | Source: Ambulatory Visit | Attending: Cardiology | Admitting: Cardiology

## 2017-04-23 ENCOUNTER — Telehealth (HOSPITAL_COMMUNITY): Payer: Self-pay

## 2017-04-23 DIAGNOSIS — I050 Rheumatic mitral stenosis: Secondary | ICD-10-CM | POA: Diagnosis not present

## 2017-04-23 DIAGNOSIS — I5032 Chronic diastolic (congestive) heart failure: Secondary | ICD-10-CM | POA: Diagnosis not present

## 2017-04-23 LAB — ECHOCARDIOGRAM COMPLETE
CHL CUP DOP CALC LVOT VTI: 27.4 cm
CHL CUP STROKE VOLUME: 82 mL
EERAT: 16.6
EWDT: 440 ms
FS: 23 % — AB (ref 28–44)
IV/PV OW: 1.02
LA diam end sys: 50 mm
LA vol: 92.5 mL
LADIAMINDEX: 2.34 cm/m2
LASIZE: 50 mm
LAVOLA4C: 82.5 mL
LAVOLIN: 43.2 mL/m2
LV E/e' medial: 16.6
LV dias vol index: 66 mL/m2
LV sys vol: 60 mL (ref 21–61)
LVDIAVOL: 141 mL (ref 62–150)
LVEEAVG: 16.6
LVELAT: 7.95 cm/s
LVOT peak grad rest: 7 mmHg
LVOTPV: 128 cm/s
LVSYSVOLIN: 28 mL/m2
MV Annulus VTI: 54.9 cm
MV Dec: 440
MV M vel: 109
MV pk A vel: 148 m/s
MV pk E vel: 132 m/s
MVAP: 1.67 cm2
MVPG: 7 mmHg
Mean grad: 6 mmHg
P 1/2 time: 132 ms
PW: 12.5 mm — AB (ref 0.6–1.1)
RV LATERAL S' VELOCITY: 12.7 cm/s
RV TAPSE: 11.7 mm
RV sys press: 28 mmHg
Reg peak vel: 251 cm/s
Simpson's disk: 58
TDI e' lateral: 7.95
TDI e' medial: 3.23
TR max vel: 251 cm/s

## 2017-04-23 MED ORDER — FUROSEMIDE 40 MG PO TABS
80.0000 mg | ORAL_TABLET | Freq: Every day | ORAL | 3 refills | Status: DC
Start: 2017-04-23 — End: 2017-10-19

## 2017-04-23 NOTE — Telephone Encounter (Signed)
Basic Metabolic Panel (BMET)  Order: [id number]  Status:  Final result Visible to patient:  Yes (MyChart) Dx:  Coronary atherosclerosis of autologou...  Notes recorded by [name], RN on 04/23/2017 at 12:20 PM EDT Patient aware and agreeable, Rx updated to preferred pharmacy and in patient chart, bmet added on for morning of cath. ------  Notes recorded by Harvie Junior, CMA on 04/20/2017 at 2:38 PM EDT Left VM for pt to call for lab results ------  Notes recorded by [name], MD on 04/19/2017 at 11:00 PM EDT Would have him drop back on Lasix to 80 mg daily until cath and hold it completely the day before cath and day of cath. Repeat BMET am of cath. Cut back on dietary sodium.

## 2017-04-23 NOTE — Progress Notes (Signed)
  Echocardiogram 2D Echocardiogram has been performed.  [name] 04/23/2017, 3:56 PM

## 2017-04-27 ENCOUNTER — Telehealth (HOSPITAL_COMMUNITY): Payer: Self-pay | Admitting: *Deleted

## 2017-04-27 NOTE — Telephone Encounter (Signed)
ECHOCARDIOGRAM COMPLETE  Order: [id number]  Status:  Final result Visible to patient:  Yes (MyChart) Dx:  Diastolic CHF, chronic ([place])  Notes recorded by Darron Doom, RN on 04/27/2017 at 1:48 PM EDT Called and spoke with patient, he is aware and no further questions. ------  Notes recorded by [name], MD on 04/24/2017 at 10:07 PM EDT Normal EF, repaired mitral valve looks ok.

## 2017-04-29 ENCOUNTER — Encounter (HOSPITAL_COMMUNITY): Payer: Self-pay | Admitting: Cardiology

## 2017-04-29 ENCOUNTER — Ambulatory Visit (HOSPITAL_COMMUNITY)
Admission: RE | Admit: 2017-04-29 | Discharge: 2017-04-29 | Disposition: A | Discharge status: Home / Self Care | Payer: Medicare Other | Source: Ambulatory Visit | Attending: Cardiology | Admitting: Cardiology

## 2017-04-29 ENCOUNTER — Other Ambulatory Visit (HOSPITAL_COMMUNITY): Payer: Self-pay | Admitting: *Deleted

## 2017-04-29 ENCOUNTER — Encounter (HOSPITAL_COMMUNITY)
Admission: RE | Disposition: A | Discharge status: Home / Self Care | Payer: Self-pay | Source: Ambulatory Visit | Attending: Cardiology

## 2017-04-29 DIAGNOSIS — I5032 Chronic diastolic (congestive) heart failure: Secondary | ICD-10-CM

## 2017-04-29 DIAGNOSIS — Z9884 Bariatric surgery status: Secondary | ICD-10-CM | POA: Diagnosis not present

## 2017-04-29 DIAGNOSIS — I2582 Chronic total occlusion of coronary artery: Secondary | ICD-10-CM | POA: Diagnosis not present

## 2017-04-29 DIAGNOSIS — I25810 Atherosclerosis of coronary artery bypass graft(s) without angina pectoris: Secondary | ICD-10-CM | POA: Diagnosis not present

## 2017-04-29 DIAGNOSIS — G4733 Obstructive sleep apnea (adult) (pediatric): Secondary | ICD-10-CM | POA: Diagnosis not present

## 2017-04-29 DIAGNOSIS — N183 Chronic kidney disease, stage 3 (moderate): Secondary | ICD-10-CM | POA: Diagnosis not present

## 2017-04-29 DIAGNOSIS — E669 Obesity, unspecified: Secondary | ICD-10-CM | POA: Diagnosis not present

## 2017-04-29 DIAGNOSIS — Z6834 Body mass index (BMI) 34.0-34.9, adult: Secondary | ICD-10-CM | POA: Insufficient documentation

## 2017-04-29 DIAGNOSIS — Z8673 Personal history of transient ischemic attack (TIA), and cerebral infarction without residual deficits: Secondary | ICD-10-CM | POA: Insufficient documentation

## 2017-04-29 DIAGNOSIS — I130 Hypertensive heart and chronic kidney disease with heart failure and stage 1 through stage 4 chronic kidney disease, or unspecified chronic kidney disease: Secondary | ICD-10-CM | POA: Insufficient documentation

## 2017-04-29 DIAGNOSIS — Z7982 Long term (current) use of aspirin: Secondary | ICD-10-CM | POA: Diagnosis not present

## 2017-04-29 DIAGNOSIS — Z7984 Long term (current) use of oral hypoglycemic drugs: Secondary | ICD-10-CM | POA: Insufficient documentation

## 2017-04-29 DIAGNOSIS — R0609 Other forms of dyspnea: Secondary | ICD-10-CM | POA: Diagnosis present

## 2017-04-29 DIAGNOSIS — E785 Hyperlipidemia, unspecified: Secondary | ICD-10-CM | POA: Diagnosis not present

## 2017-04-29 DIAGNOSIS — Y831 Surgical operation with implant of artificial internal device as the cause of abnormal reaction of the patient, or of later complication, without mention of misadventure at the time of the procedure: Secondary | ICD-10-CM | POA: Diagnosis not present

## 2017-04-29 DIAGNOSIS — T82855A Stenosis of coronary artery stent, initial encounter: Secondary | ICD-10-CM | POA: Insufficient documentation

## 2017-04-29 DIAGNOSIS — E1122 Type 2 diabetes mellitus with diabetic chronic kidney disease: Secondary | ICD-10-CM | POA: Insufficient documentation

## 2017-04-29 DIAGNOSIS — I2510 Atherosclerotic heart disease of native coronary artery without angina pectoris: Secondary | ICD-10-CM | POA: Diagnosis not present

## 2017-04-29 HISTORY — PX: LEFT HEART CATH AND CORONARY ANGIOGRAPHY: CATH118249

## 2017-04-29 LAB — BASIC METABOLIC PANEL
ANION GAP: 9 (ref 5–15)
BUN: 15 mg/dL (ref 6–20)
CALCIUM: 8.9 mg/dL (ref 8.9–10.3)
CHLORIDE: 104 mmol/L (ref 101–111)
CO2: 26 mmol/L (ref 22–32)
Creatinine, Ser: 1.76 mg/dL — ABNORMAL HIGH (ref 0.61–1.24)
GFR calc non Af Amer: 37 mL/min — ABNORMAL LOW (ref >60)
GFR, EST AFRICAN AMERICAN: 43 mL/min — AB (ref >60)
Glucose, Bld: 132 mg/dL — ABNORMAL HIGH (ref 65–99)
POTASSIUM: 3.9 mmol/L (ref 3.5–5.1)
Sodium: 139 mmol/L (ref 135–145)

## 2017-04-29 LAB — GLUCOSE, CAPILLARY: Glucose-Capillary: 127 mg/dL — ABNORMAL HIGH (ref 65–99)

## 2017-04-29 SURGERY — LEFT HEART CATH AND CORONARY ANGIOGRAPHY
Anesthesia: LOCAL

## 2017-04-29 MED ORDER — ASPIRIN 81 MG PO CHEW: 81.0000 mg | CHEWABLE_TABLET | ORAL | Status: DC

## 2017-04-29 MED ORDER — SODIUM CHLORIDE 0.9% FLUSH: 3.0000 mL | INTRAVENOUS | Status: DC | PRN

## 2017-04-29 MED ORDER — HEPARIN (PORCINE) IN NACL 2-0.9 UNIT/ML-% IJ SOLN
INTRAMUSCULAR | Status: AC | PRN
  Administered 2017-04-29: 1000 mL

## 2017-04-29 MED ORDER — HEPARIN SODIUM (PORCINE) 1000 UNIT/ML IJ SOLN
INTRAMUSCULAR | Status: AC
  Filled 2017-04-29: qty 1

## 2017-04-29 MED ORDER — HEPARIN SODIUM (PORCINE) 1000 UNIT/ML IJ SOLN
INTRAMUSCULAR | Status: DC | PRN
  Administered 2017-04-29: 5000 [IU] via INTRAVENOUS

## 2017-04-29 MED ORDER — IOPAMIDOL (ISOVUE-370) INJECTION 76%
INTRAVENOUS | Status: AC
  Filled 2017-04-29: qty 100

## 2017-04-29 MED ORDER — IOPAMIDOL (ISOVUE-370) INJECTION 76%
INTRAVENOUS | Status: DC | PRN
  Administered 2017-04-29: 60 mL via INTRA_ARTERIAL

## 2017-04-29 MED ORDER — VERAPAMIL HCL 2.5 MG/ML IV SOLN
INTRAVENOUS | Status: DC | PRN
  Administered 2017-04-29: 10 mL via INTRA_ARTERIAL

## 2017-04-29 MED ORDER — VERAPAMIL HCL 2.5 MG/ML IV SOLN
INTRAVENOUS | Status: AC
  Filled 2017-04-29: qty 2

## 2017-04-29 MED ORDER — SODIUM CHLORIDE 0.9 % WEIGHT BASED INFUSION: 1.0000 mL/kg/h | INTRAVENOUS | Status: DC

## 2017-04-29 MED ORDER — HEPARIN (PORCINE) IN NACL 2-0.9 UNIT/ML-% IJ SOLN
INTRAMUSCULAR | Status: AC
  Filled 2017-04-29: qty 500

## 2017-04-29 MED ORDER — MIDAZOLAM HCL 2 MG/2ML IJ SOLN
INTRAMUSCULAR | Status: AC
  Filled 2017-04-29: qty 2

## 2017-04-29 MED ORDER — SODIUM CHLORIDE 0.9% FLUSH: 3.0000 mL | Freq: Two times a day (BID) | INTRAVENOUS | Status: DC

## 2017-04-29 MED ORDER — FENTANYL CITRATE (PF) 100 MCG/2ML IJ SOLN
INTRAMUSCULAR | Status: DC | PRN
  Administered 2017-04-29: 25 ug via INTRAVENOUS

## 2017-04-29 MED ORDER — SODIUM CHLORIDE 0.9 % IV SOLN: 250.0000 mL | INTRAVENOUS | Status: DC | PRN

## 2017-04-29 MED ORDER — SODIUM CHLORIDE 0.9% FLUSH
3.0000 mL | INTRAVENOUS | Status: DC | PRN
Start: 2017-04-29 — End: 2017-04-29

## 2017-04-29 MED ORDER — ONDANSETRON HCL 4 MG/2ML IJ SOLN: 4.0000 mg | Freq: Four times a day (QID) | INTRAMUSCULAR | Status: DC | PRN

## 2017-04-29 MED ORDER — ACETAMINOPHEN 325 MG PO TABS: 650.0000 mg | ORAL_TABLET | ORAL | Status: DC | PRN

## 2017-04-29 MED ORDER — LIDOCAINE HCL (PF) 1 % IJ SOLN
INTRAMUSCULAR | Status: DC | PRN
  Administered 2017-04-29: 2 mL

## 2017-04-29 MED ORDER — FENTANYL CITRATE (PF) 100 MCG/2ML IJ SOLN
INTRAMUSCULAR | Status: AC
  Filled 2017-04-29: qty 2

## 2017-04-29 MED ORDER — SODIUM CHLORIDE 0.9 % IV SOLN
INTRAVENOUS | Status: DC
  Administered 2017-04-29: 08:00:00 via INTRAVENOUS

## 2017-04-29 MED ORDER — MIDAZOLAM HCL 2 MG/2ML IJ SOLN
INTRAMUSCULAR | Status: DC | PRN
  Administered 2017-04-29: 1 mg via INTRAVENOUS

## 2017-04-29 SURGICAL SUPPLY — 10 items
CATH IMPULSE 5F ANG/FL3.5 (CATHETERS) ×1 IMPLANT
CATH INFINITI 5FR AL1 (CATHETERS) ×1 IMPLANT
DEVICE RAD COMP TR BAND LRG (VASCULAR PRODUCTS) ×1 IMPLANT
GLIDESHEATH SLEND SS 6F .021 (SHEATH) ×1 IMPLANT
GUIDEWIRE INQWIRE 1.5J.035X260 (WIRE) IMPLANT
INQWIRE 1.5J .035X260CM (WIRE) ×2
KIT HEART LEFT (KITS) ×2 IMPLANT
PACK CARDIAC CATHETERIZATION (CUSTOM PROCEDURE TRAY) ×2 IMPLANT
TRANSDUCER W/STOPCOCK (MISCELLANEOUS) ×2 IMPLANT
TUBING CIL FLEX 10 FLL-RA (TUBING) ×2 IMPLANT

## 2017-04-29 NOTE — H&P (View-Only) (Signed)
Patient ID: [name], male   DOB: 07/02/1946, 71 y.o.   MRN: [id number] PCP: Dr Harrington Challenger Cardiology: Dr. Aundra Dubin  71 yo with history of CAD s/p CABG and obesity s/p lap banding procedure presents for cardiology followup.  Given exertional dyspnea and chest tightness, he had LHC in 1/15.  This showed no change from prior.  Grafts were patent and there was severe stenosis in distal D1, not amenable to PCI.   In 2017, he developed increased exertional dyspnea.  Eventually had a TEE showing severe MR with prolapse of a portion of the anterior mitral valve leaflet.  Coronary angiography in 6/17 showed patent grafts and 90% stenosis of distal diagonal not amenable to intervention.  In 8/17, he had mitral valve repair.  Post-op diastolic CHF, Lasix started and increased.   He was doing well until a few weeks ago.  He started to develop tightness in his chest with exertion. This has generally been mild.  He will get it walking up a flight of stairs or walking across his large yard.  Pain will resolve quickly with rest.  He has not taken NTG.  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  About 1 week ago, he had a bad episode of chest pain.  He was dancing with his wife and had severe central chest pain along with dyspnea.  He sat down, and pain resolved after 10-15 minutes. This was the most severe episode he had had.  No palpitations. He recently increased Lasix on his own to 80 mg bid because of increased exertional dyspnea.   ECG (personally reviewed): NSR, left axis deviation  Labs (11/12): LDL 70 Labs (5/13): K 4.1, creatinine 1.1, BNP 48 Labs (12/13): K 3.4, creatinine 1.07, LDL 59, HDL 37 Labs (1/15): K 4.3, creatinine 1.2, HCT 40.3 Labs (3/15): LDL 38, HDL 81, BNP 55, K 4, creatinine 1.2 Labs (6/15): K 4.2, creatinine 1.4, LFTs normal, LDL 66, HDL 35 Labs (8/16): K 4, creatinine 1.6, BNP 64 Labs (12/16): LDL 61, HDL 37 Labs (4/17): K  4.2, creatinine 1.35, HCT 38.7, BNP 80 Labs (8/17): K 4.2, creatinine 1.4 => 1.66, BNP 159 Labs (12/17): K 3.6, creatinine 1.75 Labs (2/18): LDL 76, HDL 36  PMH: 1. OSA: on CPAP.  2. Diabetes mellitus 3. Allergic rhinitis 4. CAD: s/p CABG 1995.  LHC (10/11) with 90% distal D1 (patent proximal D1 stent), total occlusion CFX, total occlusion RCA, no significant stenoses in LAD, SVG-PDA patent, SVG-OM patent, EF 55%.  Medical management.  LHC (5/13) with patent grafts and 90% distal D1 stenosis (no significant change from 10/11).  LHC (1/15) with patent grafts, 75% ISR in D1 stent, 90% stenosis distal D1 (small caliber). D1 not amenable to intervention.  - Coronary angiography (6/17): grafts patent, 90% stenosis distally in large diagonal not amenable to intervention.   5. Obesity: lap-banding in 5/11.  6. Chronic diastolic CHF: Echo (1/70) with EF 55-60%, moderate LVH, moderate diastolic dysfunction, s/p mitral valve repair with elevated mean gradient but normal pressure half-time.  7. TIA: Carotid dopplers in 11/11 with 0-39% bilateral stenosis.  Carotid dopplers 6/17 with 1-39% RICA stenosis.  8. OA: Knees, C-spine. TKR 9/13.  9. HTN 10. Hyperlipidemia: Myalgias with atorvastatin and Crestor.  11. Diverticulosis 12. MItral regurgitation: TEE (6/17) with EF 55-60%, severe eccentric MR, prolapse of a portion of the anterior leaflet, peak RV-RA gradient 45 mmHg.  - Mitral valve repair 8/17 13.  CKD: Stage III.  14. Event monitor (1/18): Occasional PVCs, no significant abnormalities.   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   Family History  Problem Relation Age of Onset  . Other Mother        joint problems  . Hypertension Father   . Heart disease Father   . CVA Father   . Depression Father   . Heart disease Sister        CABG  . Stroke Sister   . Hypertension Sister   . Congestive Heart Failure Sister   . Diabetes Sister   . Heart disease  Brother   . Hypertension Brother   . Congestive Heart Failure Brother     ROS: All systems reviewed and negative except as per HPI.    Current Outpatient Prescriptions  Medication Sig Dispense Refill  . amoxicillin (AMOXIL) 500 MG tablet Take 4 tablets (2 g) by mouth 30-60 minutes prior to dental appointment 12 tablet 0  . aspirin EC 81 MG tablet Take 81 mg by mouth daily.    . carvedilol (COREG) 3.125 MG tablet Take 1 tablet (3.125 mg total) by mouth 2 (two) times daily. 180 tablet 2  . cetirizine (ZYRTEC) 10 MG chewable tablet Chew 10 mg by mouth as needed.     . Chlorpheniramine-DM (CORICIDIN HBP COUGH/COLD PO) Take 1 tablet by mouth daily as needed (cold).    . Cholecalciferol (VITAMIN D) 2000 units CAPS Take 2,000 Units by mouth daily.    . fluticasone (FLONASE) 50 MCG/ACT nasal spray Place 1 spray into both nostrils daily as needed for allergies or rhinitis.    . furosemide (LASIX) 40 MG tablet Take 80 mg (2 tabs) in am and 40 mg (1 tab) in pm 90 tablet 3  . HYDROcodone-acetaminophen (NORCO) 10-325 MG tablet Take 1 tablet by mouth every 4 (four) hours as needed for moderate pain. 20 tablet 0  . metFORMIN (GLUCOPHAGE) 500 MG tablet Take 500 mg by mouth 2 (two) times daily with a meal.    . nitroGLYCERIN (NITROSTAT) 0.4 MG SL tablet DISSOLVE ONE TABLET UNDER THE TONGUE EVERY 5 MINUTES AS NEEDED FOR CHEST PAIN.  DO NOT EXCEED A TOTAL OF 3 DOSES IN 15 MINUTES 25 tablet 3  . potassium chloride SA (K-DUR,KLOR-CON) 20 MEQ tablet Take 20 mEq by mouth 2 (two) times daily.    . pravastatin (PRAVACHOL) 40 MG tablet Take 1 tablet (40 mg total) by mouth every evening. 90 tablet 3  . traZODone (DESYREL) 100 MG tablet Take 100 mg by mouth at bedtime.     . VENTOLIN HFA 108 (90 Base) MCG/ACT inhaler Inhale 1 puff into the lungs every 4 (four) hours as needed for wheezing or shortness of breath.     . isosorbide mononitrate (IMDUR) 30 MG 24 hr tablet Take 1 tablet (30 mg total) by mouth daily. 30  tablet 3   No current facility-administered medications for this encounter.     BP 117/82   Pulse 74   Wt 224 lb 4 oz (101.7 kg)   SpO2 100%   BMI 34.35 kg/m  General: NAD Neck: JVP 7-8 cm, no thyromegaly or thyroid nodule.  Lungs: Clear to auscultation bilaterally with normal respiratory effort. CV: Nondisplaced PMI.  Heart regular S1/S2, no S3/S4, 2/6 early SEM RUSB.  1+ ankle edema.  No carotid bruit.  Normal pedal pulses.  Abdomen: Soft, nontender, no hepatosplenomegaly, no distention.  Skin: Intact without lesions or  rashes.  Neurologic: Alert and oriented x 3.  Psych: Normal affect. Extremities: No clubbing or cyanosis.  HEENT: Normal.    Assessment/Plan: 1. S/p mitral valve repair:  Post-op echo in 8/17 showed elevated mean gradient across the valve but normal pressure half-time (?high flow).  - I am going to obtain a repeat echo with increased exertional dyspnea and chest pain.    - With prosthetic material, should use antibiotic prophylaxis with dental work.  2. CAD:  From prior cath, he has severe stenosis in a distal D1 that is not amenable to intervention.  Recently, he has developed worsening exertional chest pain and dyspnea that is concerning for angina.   - Continue ASA 81,Coreg, pravastatin.   - Add Imdur 30 mg daily.  He was warned not to take Cialis while getting Imdur.  - Given worrisome symptoms, I think he should have coronary angiography.  We discussed risks/benefits of the test today and he agrees to proceed with it.  Will need to minimize contrast with CKD and prehydrate on arrival to short stay.  I will also have him hold his metformin peri-cath.  3. Hyperlipidemia: Myalgias with Crestor and atorvastatin.  He is now on pravastatin, lipids were acceptable in 2/18.  4. Chronic diastolic CHF: Volume status looks ok today.  LVH and diastolic dysfunction on last echo. Possible that current exertional dyspnea is ischemia-related.  - Continue Lasix 80 qam/40 qpm  (would decrease back to this from Lasix 80 mg bid).  BMET today.  5. OSA: Continue CPAP.   Followup in 4 months.   [name] 04/19/2017

## 2017-04-29 NOTE — Interval H&P Note (Signed)
History and Physical Interval Note:  04/29/2017 8:43 AM  [name]  has presented today for surgery, with the diagnosis of hf  The various methods of treatment have been discussed with the patient and family. After consideration of risks, benefits and other options for treatment, the patient has consented to  Procedure(s): LEFT HEART CATH AND CORONARY ANGIOGRAPHY (N/A) as a surgical intervention .  The patient's history has been reviewed, patient examined, no change in status, stable for surgery.  I have reviewed the patient's chart and labs.  Questions were answered to the patient's satisfaction.     Jeanett Antonopoulos [employer]

## 2017-04-29 NOTE — Discharge Instructions (Signed)

## 2017-05-04 ENCOUNTER — Encounter (HOSPITAL_COMMUNITY): Payer: Medicare Other | Admitting: Cardiology

## 2017-05-14 ENCOUNTER — Ambulatory Visit (HOSPITAL_COMMUNITY)
Admission: RE | Admit: 2017-05-14 | Discharge: 2017-05-14 | Disposition: A | Discharge status: Home / Self Care | Payer: Medicare Other | Source: Ambulatory Visit | Attending: Cardiology | Admitting: Cardiology

## 2017-05-14 ENCOUNTER — Telehealth (HOSPITAL_COMMUNITY): Payer: Self-pay

## 2017-05-14 ENCOUNTER — Encounter (HOSPITAL_COMMUNITY): Payer: Self-pay | Admitting: Cardiology

## 2017-05-14 VITALS — BP 109/72 | HR 71 | Wt 225.0 lb

## 2017-05-14 DIAGNOSIS — I5032 Chronic diastolic (congestive) heart failure: Secondary | ICD-10-CM | POA: Diagnosis not present

## 2017-05-14 DIAGNOSIS — M9903 Segmental and somatic dysfunction of lumbar region: Secondary | ICD-10-CM | POA: Diagnosis not present

## 2017-05-14 DIAGNOSIS — Z79899 Other long term (current) drug therapy: Secondary | ICD-10-CM | POA: Insufficient documentation

## 2017-05-14 DIAGNOSIS — Z8673 Personal history of transient ischemic attack (TIA), and cerebral infarction without residual deficits: Secondary | ICD-10-CM | POA: Insufficient documentation

## 2017-05-14 DIAGNOSIS — N183 Chronic kidney disease, stage 3 (moderate): Secondary | ICD-10-CM | POA: Diagnosis not present

## 2017-05-14 DIAGNOSIS — E785 Hyperlipidemia, unspecified: Secondary | ICD-10-CM | POA: Diagnosis not present

## 2017-05-14 DIAGNOSIS — M5137 Other intervertebral disc degeneration, lumbosacral region: Secondary | ICD-10-CM | POA: Diagnosis not present

## 2017-05-14 DIAGNOSIS — I130 Hypertensive heart and chronic kidney disease with heart failure and stage 1 through stage 4 chronic kidney disease, or unspecified chronic kidney disease: Secondary | ICD-10-CM | POA: Insufficient documentation

## 2017-05-14 DIAGNOSIS — Z7984 Long term (current) use of oral hypoglycemic drugs: Secondary | ICD-10-CM | POA: Insufficient documentation

## 2017-05-14 DIAGNOSIS — M9902 Segmental and somatic dysfunction of thoracic region: Secondary | ICD-10-CM | POA: Diagnosis not present

## 2017-05-14 DIAGNOSIS — M5415 Radiculopathy, thoracolumbar region: Secondary | ICD-10-CM | POA: Diagnosis not present

## 2017-05-14 DIAGNOSIS — G4733 Obstructive sleep apnea (adult) (pediatric): Secondary | ICD-10-CM | POA: Diagnosis not present

## 2017-05-14 DIAGNOSIS — Z9889 Other specified postprocedural states: Secondary | ICD-10-CM

## 2017-05-14 DIAGNOSIS — I2510 Atherosclerotic heart disease of native coronary artery without angina pectoris: Secondary | ICD-10-CM | POA: Diagnosis not present

## 2017-05-14 DIAGNOSIS — M5386 Other specified dorsopathies, lumbar region: Secondary | ICD-10-CM | POA: Diagnosis not present

## 2017-05-14 DIAGNOSIS — E1122 Type 2 diabetes mellitus with diabetic chronic kidney disease: Secondary | ICD-10-CM | POA: Diagnosis not present

## 2017-05-14 DIAGNOSIS — E669 Obesity, unspecified: Secondary | ICD-10-CM | POA: Insufficient documentation

## 2017-05-14 DIAGNOSIS — Z7982 Long term (current) use of aspirin: Secondary | ICD-10-CM | POA: Insufficient documentation

## 2017-05-14 DIAGNOSIS — M9905 Segmental and somatic dysfunction of pelvic region: Secondary | ICD-10-CM | POA: Diagnosis not present

## 2017-05-14 LAB — BASIC METABOLIC PANEL
ANION GAP: 8 (ref 5–15)
BUN: 16 mg/dL (ref 6–20)
CALCIUM: 9.3 mg/dL (ref 8.9–10.3)
CO2: 25 mmol/L (ref 22–32)
Chloride: 103 mmol/L (ref 101–111)
Creatinine, Ser: 1.97 mg/dL — ABNORMAL HIGH (ref 0.61–1.24)
GFR calc Af Amer: 38 mL/min — ABNORMAL LOW (ref >60)
GFR, EST NON AFRICAN AMERICAN: 32 mL/min — AB (ref >60)
GLUCOSE: 184 mg/dL — AB (ref 65–99)
Potassium: 3.6 mmol/L (ref 3.5–5.1)
Sodium: 136 mmol/L (ref 135–145)

## 2017-05-14 LAB — CBC
HCT: 36.9 % — ABNORMAL LOW (ref 39.0–52.0)
HEMOGLOBIN: 11.9 g/dL — AB (ref 13.0–17.0)
MCH: 28.1 pg (ref 26.0–34.0)
MCHC: 32.2 g/dL (ref 30.0–36.0)
MCV: 87 fL (ref 78.0–100.0)
Platelets: 243 10*3/uL (ref 150–400)
RBC: 4.24 MIL/uL (ref 4.22–5.81)
RDW: 14.5 % (ref 11.5–15.5)
WBC: 6.1 10*3/uL (ref 4.0–10.5)

## 2017-05-14 MED ORDER — RANOLAZINE ER 500 MG PO TB12: 500.0000 mg | ORAL_TABLET | Freq: Two times a day (BID) | ORAL | 3 refills | Status: DC

## 2017-05-14 NOTE — Patient Instructions (Addendum)
Stop Isorbide Mononitrate (ImDUR)  Start Ranolazine 500 mg (1 tab), twice a day  if chest pain starts  Labs drawn today (if we do not call you, then your lab work was stable)   Your physician recommends that you schedule a follow-up appointment in: 6 months

## 2017-05-14 NOTE — Telephone Encounter (Signed)
I called patient to discuss scheduling for cardiac rehab. Patient stated that he was on his way to an appointment to see Dr. Aundra Dubin and he was going to discuss cardiac rehab with him.  Patient stated he had done cardiac rehab before and Dr. Aundra Dubin really thinks he need to do it he will. I asked patient if it was okay for me to follow up with him on Monday and patient confirmed okay to contact him on Monday.

## 2017-05-14 NOTE — Telephone Encounter (Signed)
Patient insurances are active and benefits verified. Patient insurances are Medicare A/B and [place]. Medicare A/B - no co-pay, deductible $183.00/$183.00 has been met, 20% co-insurance, no out of pocket and no pre-authorization. Passport/reference [phone]. [employer] supplement - no co-pay, no deductible, no co-insurance, no out of pocket and no pre-authorization. Passport/reference [phone].

## 2017-05-17 ENCOUNTER — Telehealth (HOSPITAL_COMMUNITY): Payer: Self-pay

## 2017-05-17 NOTE — Telephone Encounter (Signed)
Notes recorded by Shirley Muscat, RN on 05/17/2017 at 12:12 PM EDT Pt aware of results and agreeable to med changes. Pt has appt for 10/1 at 1045 am for BMET ------  Notes recorded by [name], MD on 05/17/2017 at 12:45 AM EDT Decrease Lasix to 60 mg daily and repeat BMET in 10 days.

## 2017-05-17 NOTE — Progress Notes (Signed)
Patient ID: [name], male   DOB: 1946-03-09, 71 y.o.   MRN: [id number] PCP: Dr Harrington Challenger Cardiology: Dr. Aundra Dubin  71 yo with history of CAD s/p CABG and obesity s/p lap banding procedure presents for cardiology followup.  Given exertional dyspnea and chest tightness, he had LHC in 1/15.  This showed no change from prior.  Grafts were patent and there was severe stenosis in distal D1, not amenable to PCI.   In 2017, he developed increased exertional dyspnea.  Eventually had a TEE showing severe MR with prolapse of a portion of the anterior mitral valve leaflet.  Coronary angiography in 6/17 showed patent grafts and 90% stenosis of distal diagonal not amenable to intervention.  In 8/17, he had mitral valve repair.  Post-op diastolic CHF, Lasix started and increased.   He returns for followup of CAD and CHF.  He had a cath earlier this month showing stable coronary anatomy.  He has not had any more chest pain.  Says that Imdur "wipes me out" and takes all his energy.  No dyspnea walking on flat ground.  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   ECG (personally reviewed): NSR, left axis deviation  Labs (11/12): LDL 70 Labs (5/13): K 4.1, creatinine 1.1, BNP 48 Labs (12/13): K 3.4, creatinine 1.07, LDL 59, HDL 37 Labs (1/15): K 4.3, creatinine 1.2, HCT 40.3 Labs (3/15): LDL 38, HDL 81, BNP 55, K 4, creatinine 1.2 Labs (6/15): K 4.2, creatinine 1.4, LFTs normal, LDL 66, HDL 35 Labs (8/16): K 4, creatinine 1.6, BNP 64 Labs (12/16): LDL 61, HDL 37 Labs (4/17): K 4.2, creatinine 1.35, HCT 38.7, BNP 80 Labs (8/17): K 4.2, creatinine 1.4 => 1.66, BNP 159 Labs (12/17): K 3.6, creatinine 1.75 Labs (2/18): LDL 76, HDL 36 Labs (9/18): K 3.9, creatinine 1.76  PMH: 1. OSA: on CPAP.  2. Diabetes mellitus 3. Allergic rhinitis 4. CAD: s/p CABG 1995.  LHC (10/11) with 90% distal D1 (patent proximal D1 stent), total occlusion CFX, total occlusion RCA, no significant stenoses in LAD, SVG-PDA patent, SVG-OM  patent, EF 55%.  Medical management.  LHC (5/13) with patent grafts and 90% distal D1 stenosis (no significant change from 10/11).  LHC (1/15) with patent grafts, 75% ISR in D1 stent, 90% stenosis distal D1 (small caliber). D1 not amenable to intervention.  - Coronary angiography (6/17): grafts patent, 90% stenosis distally in large diagonal not amenable to intervention.   - LHC (9/18): SVG-OM and SVG-PDA patent,  75% in-stent restenosis in proximal diagonal then 90% stenosis distally (small in caliber at that point), totally occluded LCx and RCA.  Left coronary system similar to prior caths, D1 may be source of angina.  5. Obesity: lap-banding in 5/11.  6. Chronic diastolic CHF: Echo (9/38) with EF 55-60%, moderate LVH, moderate diastolic dysfunction, s/p mitral valve repair with elevated mean gradient but normal pressure half-time.  - Echo (8/18): EF 55-60%, s/p MV repair with mean gradient 6 mmHg, PASP 28 mmHg, mildly decreased RV systolic function.  7. TIA: Carotid dopplers in 11/11 with 0-39% bilateral stenosis.  Carotid dopplers 6/17 with 1-39% RICA stenosis.  8. OA: Knees, C-spine. TKR 9/13.  9. HTN 10. Hyperlipidemia: Myalgias with atorvastatin and Crestor.  11. Diverticulosis 12. MItral regurgitation: TEE (6/17) with EF 55-60%, severe eccentric MR, prolapse of a portion of the anterior leaflet, peak RV-RA gradient 45 mmHg.  - Mitral valve repair 8/17 13. CKD: Stage III.  14. Event monitor (1/18): Occasional PVCs, no significant abnormalities.  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  Lives in [place].   Family History  Problem Relation Age of Onset  . Other Mother        joint problems  . Hypertension Father   . Heart disease Father   . CVA Father   . Depression Father   . Heart disease Sister        CABG  . Stroke Sister   . Hypertension Sister   . Congestive Heart Failure Sister   . Diabetes Sister   . Heart disease Brother   . Hypertension Brother    . Congestive Heart Failure Brother     ROS: All systems reviewed and negative except as per HPI.    Current Outpatient Prescriptions  Medication Sig Dispense Refill  . amoxicillin (AMOXIL) 500 MG tablet Take 4 tablets (2 g) by mouth 30-60 minutes prior to dental appointment 12 tablet 0  . aspirin EC 81 MG tablet Take 81 mg by mouth daily.    . carvedilol (COREG) 3.125 MG tablet Take 1 tablet (3.125 mg total) by mouth 2 (two) times daily. 180 tablet 2  . cetirizine (ZYRTEC) 10 MG tablet Take 10 mg by mouth daily.    . Cholecalciferol (VITAMIN D) 2000 units CAPS Take 2,000 Units by mouth daily.    . fluticasone (FLONASE) 50 MCG/ACT nasal spray Place 1 spray into both nostrils daily as needed for allergies or rhinitis.    . furosemide (LASIX) 40 MG tablet Take 2 tablets (80 mg total) by mouth daily. 90 tablet 3  . HYDROcodone-acetaminophen (NORCO) 10-325 MG tablet Take 1 tablet by mouth every 4 (four) hours as needed for moderate pain. 20 tablet 0  . Hypromellose (ARTIFICIAL TEARS OP) Apply 1 drop to eye 2 (two) times daily as needed (dry eyes).    Marland Kitchen ibuprofen (ADVIL,MOTRIN) 200 MG tablet Take 800 mg by mouth daily as needed for moderate pain.    . metFORMIN (GLUCOPHAGE) 500 MG tablet Take 500 mg by mouth 2 (two) times daily with a meal.    . nitroGLYCERIN (NITROSTAT) 0.4 MG SL tablet DISSOLVE ONE TABLET UNDER THE TONGUE EVERY 5 MINUTES AS NEEDED FOR CHEST PAIN.  DO NOT EXCEED A TOTAL OF 3 DOSES IN 15 MINUTES 25 tablet 3  . OVER THE COUNTER MEDICATION Take 4 sprays by mouth every 3 (three) hours as needed (dry mouth). Saliva spray    . PARoxetine (PAXIL) 40 MG tablet Take 20 mg by mouth daily as needed (PTSD symptoms).    . potassium chloride SA (K-DUR,KLOR-CON) 20 MEQ tablet Take 20 mEq by mouth 2 (two) times daily.    . pravastatin (PRAVACHOL) 40 MG tablet Take 1 tablet (40 mg total) by mouth every evening. 90 tablet 3  . tadalafil (CIALIS) 20 MG tablet Take 20 mg by mouth daily as needed  for erectile dysfunction.    . tamsulosin (FLOMAX) 0.4 MG CAPS capsule Take 0.4 mg by mouth daily.    Marland Kitchen testosterone cypionate (DEPOTESTOSTERONE CYPIONATE) 200 MG/ML injection Inject 200 mg into the muscle once a week.    . traZODone (DESYREL) 150 MG tablet Take 150 mg by mouth at bedtime as needed for sleep.    . ranolazine (RANEXA) 500 MG 12 hr tablet Take 1 tablet (500 mg total) by mouth 2 (two) times daily. 60 tablet 3   No current facility-administered medications for this encounter.     BP 109/72   Pulse 71   Wt 225 lb (102.1  kg)   SpO2 98%   BMI 33.23 kg/m  General: NAD Neck: No JVD, no thyromegaly or thyroid nodule.  Lungs: Clear to auscultation bilaterally with normal respiratory effort. CV: Nondisplaced PMI.  Heart regular S1/S2, no S3/S4, no murmur.  No peripheral edema.  No carotid bruit.  Normal pedal pulses.  Abdomen: Soft, nontender, no hepatosplenomegaly, no distention.  Skin: Intact without lesions or rashes.  Neurologic: Alert and oriented x 3.  Psych: Normal affect. Extremities: No clubbing or cyanosis.  HEENT: Normal.   Assessment/Plan: 1. S/p mitral valve repair:  Echo in 8/18 showed stable mitral valve repair with no MR, mean gradient 6 mmHg.   - With prosthetic material, should use antibiotic prophylaxis with dental work.  2. CAD:  He has severe stenosis in a distal D1 that is not amenable to intervention.  This has been stable on last couple of caths.  No further chest pain.  - Continue ASA 81,Coreg, pravastatin.   - He does not like Imdur, feels like it fatigues him.  He will stop Imdur. If chest pain returns, he can use ranolazine.  3. Hyperlipidemia: Myalgias with Crestor and atorvastatin.  He is now on pravastatin, lipids were acceptable in 2/18.  4. Chronic diastolic CHF: Volume status looks ok today.  LVH and diastolic dysfunction on last echo.  - Continue Lasix 80 qam/40 qpm.  BMET today.  5. OSA: Continue CPAP.   Followup in 4 months.   [name] 05/17/2017

## 2017-05-19 ENCOUNTER — Encounter (HOSPITAL_COMMUNITY): Payer: Self-pay

## 2017-05-24 ENCOUNTER — Ambulatory Visit (HOSPITAL_COMMUNITY)
Admission: RE | Admit: 2017-05-24 | Discharge: 2017-05-24 | Disposition: A | Discharge status: Home / Self Care | Payer: Medicare Other | Source: Ambulatory Visit | Attending: Cardiology | Admitting: Cardiology

## 2017-05-24 ENCOUNTER — Other Ambulatory Visit (HOSPITAL_COMMUNITY): Payer: Self-pay | Admitting: *Deleted

## 2017-05-24 DIAGNOSIS — I5032 Chronic diastolic (congestive) heart failure: Secondary | ICD-10-CM | POA: Diagnosis not present

## 2017-05-24 LAB — BASIC METABOLIC PANEL
Anion gap: 9 (ref 5–15)
BUN: 12 mg/dL (ref 6–20)
CO2: 29 mmol/L (ref 22–32)
Calcium: 9.3 mg/dL (ref 8.9–10.3)
Chloride: 101 mmol/L (ref 101–111)
Creatinine, Ser: 1.9 mg/dL — ABNORMAL HIGH (ref 0.61–1.24)
GFR calc Af Amer: 39 mL/min — ABNORMAL LOW (ref >60)
GFR, EST NON AFRICAN AMERICAN: 34 mL/min — AB (ref >60)
GLUCOSE: 133 mg/dL — AB (ref 65–99)
POTASSIUM: 4 mmol/L (ref 3.5–5.1)
Sodium: 139 mmol/L (ref 135–145)

## 2017-05-27 ENCOUNTER — Telehealth (HOSPITAL_COMMUNITY): Payer: Self-pay

## 2017-05-27 NOTE — Telephone Encounter (Signed)
I called patient to discuss scheduling for cardiac rehab. Patient declined at this time. Patient stated that our times does not work with his schedule. Referral closed.

## 2017-06-24 ENCOUNTER — Other Ambulatory Visit (HOSPITAL_COMMUNITY): Payer: Self-pay | Admitting: Pharmacist

## 2017-06-24 MED ORDER — RANOLAZINE ER 500 MG PO TB12: 500.0000 mg | ORAL_TABLET | Freq: Two times a day (BID) | ORAL | 3 refills | Status: DC

## 2017-06-24 MED ORDER — CARVEDILOL 3.125 MG PO TABS
3.1250 mg | ORAL_TABLET | Freq: Two times a day (BID) | ORAL | 3 refills | Status: DC
Start: 2017-06-24 — End: 2017-08-19

## 2017-07-02 DIAGNOSIS — M5415 Radiculopathy, thoracolumbar region: Secondary | ICD-10-CM | POA: Diagnosis not present

## 2017-07-02 DIAGNOSIS — M9902 Segmental and somatic dysfunction of thoracic region: Secondary | ICD-10-CM | POA: Diagnosis not present

## 2017-07-02 DIAGNOSIS — M5137 Other intervertebral disc degeneration, lumbosacral region: Secondary | ICD-10-CM | POA: Diagnosis not present

## 2017-07-02 DIAGNOSIS — M9905 Segmental and somatic dysfunction of pelvic region: Secondary | ICD-10-CM | POA: Diagnosis not present

## 2017-07-02 DIAGNOSIS — M9903 Segmental and somatic dysfunction of lumbar region: Secondary | ICD-10-CM | POA: Diagnosis not present

## 2017-07-02 DIAGNOSIS — M5386 Other specified dorsopathies, lumbar region: Secondary | ICD-10-CM | POA: Diagnosis not present

## 2017-07-23 DIAGNOSIS — J069 Acute upper respiratory infection, unspecified: Secondary | ICD-10-CM | POA: Diagnosis not present

## 2017-08-06 DIAGNOSIS — Z125 Encounter for screening for malignant neoplasm of prostate: Secondary | ICD-10-CM | POA: Diagnosis not present

## 2017-08-06 DIAGNOSIS — M179 Osteoarthritis of knee, unspecified: Secondary | ICD-10-CM | POA: Diagnosis not present

## 2017-08-06 DIAGNOSIS — E291 Testicular hypofunction: Secondary | ICD-10-CM | POA: Diagnosis not present

## 2017-08-06 DIAGNOSIS — E119 Type 2 diabetes mellitus without complications: Secondary | ICD-10-CM | POA: Diagnosis not present

## 2017-08-06 DIAGNOSIS — Z0000 Encounter for general adult medical examination without abnormal findings: Secondary | ICD-10-CM | POA: Diagnosis not present

## 2017-08-06 DIAGNOSIS — E7800 Pure hypercholesterolemia, unspecified: Secondary | ICD-10-CM | POA: Diagnosis not present

## 2017-08-06 DIAGNOSIS — I10 Essential (primary) hypertension: Secondary | ICD-10-CM | POA: Diagnosis not present

## 2017-08-06 DIAGNOSIS — Z79899 Other long term (current) drug therapy: Secondary | ICD-10-CM | POA: Diagnosis not present

## 2017-08-06 DIAGNOSIS — N5201 Erectile dysfunction due to arterial insufficiency: Secondary | ICD-10-CM | POA: Diagnosis not present

## 2017-08-06 DIAGNOSIS — I4891 Unspecified atrial fibrillation: Secondary | ICD-10-CM | POA: Diagnosis not present

## 2017-08-06 DIAGNOSIS — I499 Cardiac arrhythmia, unspecified: Secondary | ICD-10-CM | POA: Diagnosis not present

## 2017-08-06 DIAGNOSIS — E559 Vitamin D deficiency, unspecified: Secondary | ICD-10-CM | POA: Diagnosis not present

## 2017-08-11 ENCOUNTER — Telehealth (HOSPITAL_COMMUNITY): Payer: Self-pay | Admitting: *Deleted

## 2017-08-11 ENCOUNTER — Emergency Department (HOSPITAL_COMMUNITY): Payer: Medicare Other

## 2017-08-11 ENCOUNTER — Emergency Department (HOSPITAL_COMMUNITY)
Admission: EM | Admit: 2017-08-11 | Discharge: 2017-08-12 | Disposition: A | Discharge status: Home / Self Care | Payer: Medicare Other | Attending: Emergency Medicine | Admitting: Emergency Medicine

## 2017-08-11 ENCOUNTER — Other Ambulatory Visit: Payer: Self-pay

## 2017-08-11 ENCOUNTER — Encounter (HOSPITAL_COMMUNITY): Payer: Self-pay | Admitting: *Deleted

## 2017-08-11 DIAGNOSIS — I2510 Atherosclerotic heart disease of native coronary artery without angina pectoris: Secondary | ICD-10-CM | POA: Insufficient documentation

## 2017-08-11 DIAGNOSIS — I110 Hypertensive heart disease with heart failure: Secondary | ICD-10-CM | POA: Insufficient documentation

## 2017-08-11 DIAGNOSIS — Z7984 Long term (current) use of oral hypoglycemic drugs: Secondary | ICD-10-CM | POA: Insufficient documentation

## 2017-08-11 DIAGNOSIS — R0602 Shortness of breath: Secondary | ICD-10-CM | POA: Diagnosis not present

## 2017-08-11 DIAGNOSIS — Z951 Presence of aortocoronary bypass graft: Secondary | ICD-10-CM | POA: Insufficient documentation

## 2017-08-11 DIAGNOSIS — Z955 Presence of coronary angioplasty implant and graft: Secondary | ICD-10-CM | POA: Diagnosis not present

## 2017-08-11 DIAGNOSIS — Z79899 Other long term (current) drug therapy: Secondary | ICD-10-CM | POA: Insufficient documentation

## 2017-08-11 DIAGNOSIS — I509 Heart failure, unspecified: Secondary | ICD-10-CM | POA: Diagnosis not present

## 2017-08-11 DIAGNOSIS — I4892 Unspecified atrial flutter: Secondary | ICD-10-CM | POA: Diagnosis not present

## 2017-08-11 DIAGNOSIS — E119 Type 2 diabetes mellitus without complications: Secondary | ICD-10-CM | POA: Diagnosis not present

## 2017-08-11 DIAGNOSIS — Z7982 Long term (current) use of aspirin: Secondary | ICD-10-CM | POA: Insufficient documentation

## 2017-08-11 DIAGNOSIS — Z85828 Personal history of other malignant neoplasm of skin: Secondary | ICD-10-CM | POA: Diagnosis not present

## 2017-08-11 DIAGNOSIS — R072 Precordial pain: Secondary | ICD-10-CM | POA: Diagnosis present

## 2017-08-11 LAB — CBC
HCT: 38.7 % — ABNORMAL LOW (ref 39.0–52.0)
HEMOGLOBIN: 12.6 g/dL — AB (ref 13.0–17.0)
MCH: 27 pg (ref 26.0–34.0)
MCHC: 32.6 g/dL (ref 30.0–36.0)
MCV: 82.9 fL (ref 78.0–100.0)
Platelets: 221 10*3/uL (ref 150–400)
RBC: 4.67 MIL/uL (ref 4.22–5.81)
RDW: 17 % — AB (ref 11.5–15.5)
WBC: 6.7 10*3/uL (ref 4.0–10.5)

## 2017-08-11 LAB — BASIC METABOLIC PANEL
ANION GAP: 8 (ref 5–15)
BUN: 24 mg/dL — ABNORMAL HIGH (ref 6–20)
CALCIUM: 9.1 mg/dL (ref 8.9–10.3)
CO2: 26 mmol/L (ref 22–32)
Chloride: 100 mmol/L — ABNORMAL LOW (ref 101–111)
Creatinine, Ser: 1.99 mg/dL — ABNORMAL HIGH (ref 0.61–1.24)
GFR calc Af Amer: 37 mL/min — ABNORMAL LOW (ref >60)
GFR, EST NON AFRICAN AMERICAN: 32 mL/min — AB (ref >60)
GLUCOSE: 144 mg/dL — AB (ref 65–99)
Potassium: 4.1 mmol/L (ref 3.5–5.1)
Sodium: 134 mmol/L — ABNORMAL LOW (ref 135–145)

## 2017-08-11 LAB — I-STAT TROPONIN, ED
TROPONIN I, POC: 0.02 ng/mL (ref 0.00–0.08)
Troponin i, poc: 0 ng/mL (ref 0.00–0.08)

## 2017-08-11 MED ORDER — APIXABAN 2.5 MG PO TABS
5.0000 mg | ORAL_TABLET | Freq: Once | ORAL | Status: DC
  Filled 2017-08-11: qty 2

## 2017-08-11 MED ORDER — APIXABAN 5 MG PO TABS
5.0000 mg | ORAL_TABLET | Freq: Once | ORAL | Status: DC
  Filled 2017-08-11: qty 1

## 2017-08-11 MED ORDER — ASPIRIN 81 MG PO CHEW
324.0000 mg | CHEWABLE_TABLET | Freq: Once | ORAL | Status: AC
  Administered 2017-08-11: 324 mg via ORAL
  Filled 2017-08-11: qty 4

## 2017-08-11 MED ORDER — APIXABAN 5 MG PO TABS
5.0000 mg | ORAL_TABLET | Freq: Once | ORAL | Status: AC
  Administered 2017-08-12: 5 mg via ORAL
  Filled 2017-08-11: qty 1

## 2017-08-11 NOTE — ED Notes (Signed)
Pt ambulatory to restroom with steady gait.

## 2017-08-11 NOTE — ED Triage Notes (Signed)
Pt states that he feels like someone hit him in his back and goes straight to front of chest, sob, confused a bit at one time.

## 2017-08-11 NOTE — ED Provider Notes (Signed)
[facility] EMERGENCY DEPARTMENT Provider Note   CSN: [id number] Arrival date & time: 08/11/17  1350     History   Chief Complaint Chief Complaint  Patient presents with  . Chest Pain    HPI [name] is a 71 y.o. male.  HPI A 71 year old male who presents with intermittent chest pain.  He has history of coronary artery disease status post CABG and stenting, diabetes, hypertension, hyperlipidemia.  He has had a recent catheterization in September 2018 showing lesion in D1 that is not amenable to stenting and causing him some intermittent anginal symptoms.  States normally does not have chest pain but occasionally once every few months.  Today has had 2 episodes of chest pain.  States it was sharp, radiating from the left scapula to the anterior chest.  Was associated with mild shortness of breath.  Did take a dose of nitroglycerin which resolved the pain.  Had recurrence of this pain again later on and had a second dose of nitroglycerin and has been chest pain-free since then.  States that symptoms occurred at rest.  He did walk up and down the stairs today without any recurrent chest pains.  Diaphoresis, nausea or vomiting, leg swelling, weakness, dyspnea on exertion, orthopnea or PND.  No recent illnesses.  Followed by Dr. [name] from cardiology.   Past Medical History:  Diagnosis Date  . Allergic rhinitis   . Anxiety   . Cancer ([place])    hx of skin cancers   . Coronary artery disease    a. s/p MI & CABG; b. s/p PCI Diag;  c. Cath 12/2011: LAD diff dzs/small, Diag 75% isr, 90 dist to stent (small), LCX & RCA occluded, VG->OM 40, VG->PDA patent - med rx.  . Depression   . Diabetes mellitus    ORAL MEDS  . Diastolic CHF, chronic (HCC)    a. EF 55-60% by echo 2007  . Dysrhythmia    "abnormal beat"  . GERD (gastroesophageal reflux disease)    "not anymore" " I had a lap band"  . History of diverticulitis of colon    5 YRS AGO  . History of pneumonia    2017  . History of transient ischemic attack (TIA)    CAROTID DOPPLERS NOV 2011  0-39& BIL. STENOSIS  . Hyperlipidemia   . Hypertension   . Mitral regurgitation   . Myocardial infarction ([place])   . Neuromuscular disorder ([place])    left arm numbness   . 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 PTSD (post-traumatic stress disorder)    from [place]  . 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 Shortness of breath dyspnea    with exertion  . Sleep apnea    cpap- see ov note in [facility] 05/12/11 for settings     Patient Active Problem List   Diagnosis Date Noted  . Cancer ([place])   . Encounter for therapeutic drug monitoring 04/03/2016  . Pneumothorax on right 03/29/2016  . 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/25/2016  . Atherosclerotic heart disease of native coronary artery without angina pectoris 03/10/2016  . Heart failure ([place]) 03/10/2016  . Cataract extraction status of eye 03/10/2016  . Other long term (current) drug therapy 03/10/2016  . Osteoarthritis of knee  03/10/2016  . Type 2 diabetes mellitus without complications ([place]) 37/05/6268  . Vitamin D deficiency 03/10/2016  . Mitral regurgitation   . Avitaminosis D 12/25/2015  . Controlled type 2 diabetes mellitus without complication ([place]) 48/54/6270  . 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 Arthritis of knee, degenerative 12/25/2015  . H/O cataract extraction 12/25/2015  . Arteriosclerosis of coronary artery 12/25/2015  . Congestive heart failure ([place]) 12/25/2015  . Pure hypercholesterolemia 12/25/2015  . Allergic rhinitis 12/25/2015  . Essential (primary) hypertension 12/25/2015  . Hypotension 01/29/2014  . Expected Blood loss, postoperative 04/29/2012  . Pain due to  unicompartmental arthroplasty of knee ([place]) 04/27/2012  . Preoperative evaluation of a medical condition to rule out surgical contraindications (TAR required) 03/31/2012  . Diastolic CHF, chronic ([place]) 06/29/2011  . Preoperative evaluation to rule out surgical contraindication 06/29/2011  . CAROTID ARTERY STENOSIS 07/09/2010  . TIA 05/29/2010  . FATIGUE / MALAISE 05/29/2010  . CAD, AUTOLOGOUS BYPASS GRAFT 11/15/2008  . MYOCARDIAL INFARCTION 12/18/2007  . ALLERGIC RHINITIS WITH CONJUNCTIVITIS 12/18/2007  . DM 12/05/2007  . HYPERLIPIDEMIA 12/05/2007  . OBESITY 12/05/2007  . Essential hypertension 12/05/2007  . Coronary atherosclerosis 12/05/2007  . DEGENERATIVE JOINT DISEASE 12/05/2007  . SLEEP APNEA 12/05/2007    Past Surgical History:  Procedure Laterality Date  . BLEPHAROPLASTY  1985   BILATERAL  . CARDIAC CATHETERIZATION  2001, 2004, 2009, 2011  . CARDIAC CATHETERIZATION N/A 01/23/2016   Procedure: Right/Left Heart Cath and Coronary Angiography;  Surgeon: [name], MD;  Location: [facility];  Service: Cardiovascular;  Laterality: N/A;  . CATARACT EXTRACTION W/ INTRAOCULAR LENS IMPLANT Bilateral   . CHONDROPLASTY  07/22/2011   Procedure: CHONDROPLASTY;  Surgeon: Dione Plover Aluisio;  Location: [facility];  Service: Orthopedics;;  . CIRCUMCISION  35 YRS  AGO  . COLONOSCOPY    . CORONARY ANGIOPLASTY  1996-- POST CABG   W/ STENT, last cath 01/07/2012   . CORONARY ARTERY BYPASS GRAFT  1995   X3 VESSEL  . CORONARY STENT PLACEMENT    . KNEE ARTHROSCOPY  07/22/2011   Procedure: ARTHROSCOPY KNEE;  Surgeon: [name];  Location: [facility];  Service: Orthopedics;  Laterality: Right;  WITH DEBRIDEMENT   . KNEE ARTHROSCOPY W/ MENISCECTOMY  X2 IN 2002-- RIGHT KNEE  . LAPAROSCOPIC GASTRIC BANDING  01-15-09  . LEFT HEART CATH AND CORONARY ANGIOGRAPHY N/A 04/29/2017   Procedure: LEFT HEART CATH AND CORONARY ANGIOGRAPHY;  Surgeon: [name], MD;  Location: [facility];  Service: Cardiovascular;  Laterality: N/A;  . LEFT HEART CATHETERIZATION WITH CORONARY ANGIOGRAM N/A 09/11/2013   Procedure: LEFT HEART CATHETERIZATION WITH CORONARY ANGIOGRAM;  Surgeon: [name], MD;  Location: [facility] CATH LAB;  Service: Cardiovascular;  Laterality: N/A;  . MITRAL VALVE REPAIR Right 03/25/2016   Procedure: MINIMALLY INVASIVE REOPERATION FOR MITRAL VALVE REPAIR  (MVR) with size 26 Sorin Memo 3D;  Surgeon: [name], MD;  Location: [facility];  Service: Open Heart Surgery;  Laterality: Right;  . RIGHT KNEE ED COMPARTMENT REPLACEMENT  2004  . SYNOVECTOMY  07/22/2011   Procedure: SYNOVECTOMY;  Surgeon: [name];  Location: [facility];  Service: Orthopedics;;  . TEE WITHOUT CARDIOVERSION N/A 01/23/2016   Procedure: TRANSESOPHAGEAL ECHOCARDIOGRAM (TEE);  Surgeon: [name], MD;  Location: [facility];  Service: Cardiovascular;  Laterality: N/A;  . TEE WITHOUT CARDIOVERSION N/A 03/25/2016   Procedure: TRANSESOPHAGEAL ECHOCARDIOGRAM (TEE);  Surgeon: [name], MD;  Location: [facility];  Service: Open Heart Surgery;  Laterality: N/A;  . UMBILICAL HERNIA REPAIR  01-15-09   W/ GASTRIC BANDING PROCEDURE       Home Medications    Prior to Admission medications   Medication Sig Start Date End Date Taking? Authorizing Provider  amoxicillin (AMOXIL) 500 MG tablet Take 4 tablets (2 g) by mouth 30-60 minutes prior to dental appointment Patient taking differently: Take 2,000 mg by mouth See admin instructions. 2,000 mg 30-60 minutes prior to dental or medical procedures 05/20/16  Yes [name], MD  Artificial Saliva (DRY MOUTH SPRAY) SOLN 4 sprays every 3 (three) hours as needed (for "dry mouth").   Yes [provider]  aspirin EC 81 MG tablet Take 81 mg by mouth daily.   Yes [provider]  carvedilol (COREG) 3.125 MG tablet Take 1 tablet (3.125 mg total) by mouth 2 (two) times daily. 06/24/17  Yes  [name], MD  cetirizine (ZYRTEC) 10 MG tablet Take 10 mg by mouth daily.   Yes [provider]  Cholecalciferol (VITAMIN D) 2000 units CAPS Take 2,000 Units by mouth daily.   Yes [provider]  fluticasone (FLONASE) 50 MCG/ACT nasal spray Place 1 spray into both nostrils daily as needed for allergies or rhinitis.   Yes [provider]  furosemide (LASIX) 40 MG tablet Take 2 tablets (80 mg total) by mouth daily. Patient taking differently: Take 40-80 mg by mouth See admin instructions. 80 mg in the morning and may take an additional 40 mg once a day as needed for swelling or edema 04/23/17  Yes [name], MD  HYDROcodone-acetaminophen [facility]) 10-325 MG tablet Take 1 tablet by mouth every 4 (four) hours as needed for moderate pain. 04/13/16  Yes [name], MD  Hypromellose (ARTIFICIAL TEARS OP) Apply 1 drop to eye 2 (two) times daily as needed (dry eyes).   Yes [provider]  ibuprofen (ADVIL,MOTRIN) 200 MG tablet Take 200-800 mg by mouth daily as needed for moderate pain.    Yes [provider]  metFORMIN (GLUCOPHAGE) 500 MG tablet Take 500 mg by mouth 2 (two) times daily with a meal.   Yes [provider]  nitroGLYCERIN (NITROSTAT) 0.4 MG SL tablet DISSOLVE ONE TABLET UNDER THE TONGUE EVERY 5 MINUTES AS NEEDED FOR CHEST PAIN.  DO NOT EXCEED A TOTAL OF 3 DOSES IN 15 MINUTES 04/13/17  Yes [name], MD  potassium chloride SA (K-DUR,KLOR-CON) 20 MEQ tablet Take 20 mEq by mouth 2 (two) times daily.   Yes [provider]  pravastatin (PRAVACHOL) 40 MG tablet Take 1 tablet (40 mg total) by mouth every evening. 06/05/16 08/11/17 Yes [name], MD  ranolazine (RANEXA) 500 MG 12 hr tablet Take 1 tablet (500 mg total) by mouth 2 (two) times daily. 06/24/17  Yes [name], MD  traZODone (DESYREL) 150 MG tablet Take 150 mg by mouth at bedtime.    Yes [provider]  apixaban (ELIQUIS) 5 MG TABS  tablet Take 1 tablet (5 mg total) by mouth 2 (two) times daily. 08/12/17   Forde Dandy, MD  PARoxetine (PAXIL) 40 MG tablet Take 20 mg by mouth daily as needed (PTSD symptoms).    [provider]  tadalafil (CIALIS) 20 MG tablet Take 20 mg by mouth daily as needed for erectile dysfunction.    [provider]  testosterone cypionate (DEPOTESTOSTERONE CYPIONATE) 200 MG/ML injection Inject 200 mg into the muscle  once a week.    [provider]    Family History Family History  Problem Relation Age of Onset  . Other Mother        joint problems  . Hypertension Father   . Heart disease Father   . CVA Father   . Depression Father   . Heart disease Sister        CABG  . Stroke Sister   . Hypertension Sister   . Congestive Heart Failure Sister   . Diabetes Sister   . Heart disease Brother   . Hypertension Brother   . Congestive Heart Failure Brother     Social History Social History   Tobacco Use  . Smoking status: Never Smoker  . Smokeless tobacco: Never Used  Substance Use Topics  . Alcohol use: Yes    Comment: OCCASIONAL  . Drug use: No     Allergies   Codeine; Crestor [rosuvastatin]; and Lipitor [atorvastatin]   Review of Systems Review of Systems  Constitutional: Negative for fatigue and fever.  Respiratory: Negative for shortness of breath.   Cardiovascular: Positive for chest pain.  Gastrointestinal: Negative for abdominal pain, diarrhea, nausea and vomiting.  All other systems reviewed and are negative.    Physical Exam Updated Vital Signs BP 130/90   Pulse 88   Temp 98.4 F (36.9 C) (Oral)   Resp (!) 25   Ht 5\' 9"  (1.753 m)   Wt 102.1 kg (225 lb)   SpO2 97%   BMI 33.23 kg/m   Physical Exam Physical Exam  Nursing note and vitals reviewed. Constitutional: Well developed, well nourished, non-toxic, and in no acute distress Head: Normocephalic and atraumatic.  Mouth/Throat: Oropharynx is clear and moist.  Neck: Normal  range of motion. Neck supple.  Cardiovascular: Normal rate and regular rhythm.   Pulmonary/Chest: Effort normal and breath sounds normal.  Abdominal: Soft. There is no tenderness. There is no rebound and no guarding.  Musculoskeletal: Normal range of motion. no edema Neurological: Alert, no facial droop, fluent speech, moves all extremities symmetrically Skin: Skin is warm and dry.  Psychiatric: Cooperative   ED Treatments / Results  Labs (all labs ordered are listed, but only abnormal results are displayed) Labs Reviewed  BASIC METABOLIC PANEL - Abnormal; Notable for the following components:      Result Value   Sodium 134 (*)    Chloride 100 (*)    Glucose, Bld 144 (*)    BUN 24 (*)    Creatinine, Ser 1.99 (*)    GFR calc non Af Amer 32 (*)    GFR calc Af Amer 37 (*)    All other components within normal limits  CBC - Abnormal; Notable for the following components:   Hemoglobin 12.6 (*)    HCT 38.7 (*)    RDW 17.0 (*)    All other components within normal limits  I-STAT TROPONIN, ED  I-STAT TROPONIN, ED  I-STAT TROPONIN, ED    EKG  EKG Interpretation  Date/Time:  Wednesday August 11 2017 19:24:52 EST Ventricular Rate:  60 PR Interval:    QRS Duration: 126 QT Interval:  421 QTC Calculation: 421 R Axis:   -20 Text Interpretation:  Atrial flutter with varied AV block, Nonspecific intraventricular conduction delay Consider anterior infarct new onset atrial flutter  Confirmed by Brantley Stage [phone]) on 08/11/2017 10:20:22 PM       Radiology Dg Chest 2 View  Result Date: 08/11/2017 CLINICAL DATA:  Short of breath EXAM: CHEST  2 VIEW COMPARISON:  08/06/2016 FINDINGS: Postop CABG with cardiac enlargement. Mitral valve replacement. Negative for heart failure. Lungs are clear without infiltrate effusion or mass. IMPRESSION: No active cardiopulmonary disease. Electronically Signed   By: Franchot Gallo M.D.   On: 08/11/2017 14:38    Procedures Procedures (including  critical care time)  Medications Ordered in ED Medications  apixaban (ELIQUIS) tablet 5 mg (not administered)  aspirin chewable tablet 324 mg (324 mg Oral Given 08/11/17 1907)     Initial Impression / Assessment and Plan / ED Course  I have reviewed the triage vital signs and the nursing notes.  Pertinent labs & imaging results that were available during my care of the patient were reviewed by me and considered in my medical decision making (see chart for details).    Records reviewed. 04/2017 [facility] copy and pasted below: 1. Patent SVG-OM and SVG-PDA.  2. LCA system looks similar to prior cath in 6/17.  D1 is a likely source for angina.  No good interventional option here, will continue to treat medically.  He has been started on Imdur and has not had any further chest pain.  I warned him about avoiding Cialis while using Imdur.  3. Creatinine at his baseline, 1.7.  Will hydrate prior to discharge   Patient does appear to be in atrial flutter with variable conduction, rate controlled primarily with heart rate in the 70s.  Patient's nurse states that patient's heart rate briefly 38-42 at one point, but when viewed on the monitor, patient had not been placed on the cardiac monitor, and was only based on the pleth of his pulse ox, which does not appear accurate.  He was asymptomatic. This does not appear to be true bradycardia for him. When placed on monitor no events of bradycardia during long ED stay.    Due to high stroke risk with new aflutter, discussed starting blood thinner. Will start Eliquis.  Patient has remained chest pain-free while here in the emergency department.  Concern for anginal equivalent however patient was recently catheterized in September 2018 for anginal symptoms.  He had stable cardiac cath, showing chronic stenosis at D1 that was not amenable to intervention.  This was felt to be the culprit.  He was also recently taken off of imdur due to being on Cialis and causing  fatigue.  His EKG is nonischemic.  Serial troponins have been normal.  Chest x-ray visualized and shows no acute cardiopulmonary processes. Given recent catheterization with known unamenable lesion, question if admission would be indicated.  Discussed with Dr. Teena Dunk from cardiology. He does recommend third troponin (10 hour repeat troponin). Patient would not warrant repeat catheterization or additional testing in the hospital.  If negative, he recommended discharge with close cardiology follow-up.   Third troponin normal. Discussed with patient who also is requesting to go home. Referral to afib clinic is made. Will start on Eliquis, and pharmacy teaching is provided. He will call Dr. Marcial Pacas office tomorrow as well. Strict return and follow-up instructions reviewed. He expressed understanding of all discharge instructions and felt comfortable with the plan of care.      This patients CHA2DS2-VASc Score and unadjusted Ischemic Stroke Rate (% per year) is equal to 4.8 % stroke rate/year from a score of 4  Above score calculated as 1 point each if present [CHF, HTN, DM, Vascular=MI/PAD/Aortic Plaque, Age if 65-74, or Male] Above score calculated as 2 points each if present [Age > 75, or Stroke/TIA/TE]    Final  Clinical Impressions(s) / ED Diagnoses   Final diagnoses:  New onset atrial flutter (HCC)  Precordial chest pain    ED Discharge Orders        Ordered    apixaban (ELIQUIS) 5 MG TABS tablet  2 times daily     08/12/17 0000    Amb referral to [facility]     08/12/17 0002       Forde Dandy, MD 08/14/17 [phone]

## 2017-08-11 NOTE — ED Notes (Signed)
Repeat POCT trop drawn, pharmacy notified re teaching needed

## 2017-08-11 NOTE — Telephone Encounter (Signed)
Pt called c/o active chest pain. He stated hes had chest pain before but this is by far the worst. I advised patient to report to the emergency room. He agreed and is on the way now. Dr.McLean notified.

## 2017-08-11 NOTE — ED Notes (Signed)
Per Dr. Oleta Mouse, still waiting on response from cardiology. Cardiologist has been paged multiple times.. Pt and family aware of delay.

## 2017-08-11 NOTE — ED Notes (Signed)
EDP at bedside. Pt with occasional episode of HR in the 38-42 bpm range. Dr. Oleta Mouse aware, will attempt to capture on monitor.

## 2017-08-12 DIAGNOSIS — I4892 Unspecified atrial flutter: Secondary | ICD-10-CM | POA: Diagnosis not present

## 2017-08-12 LAB — I-STAT TROPONIN, ED: TROPONIN I, POC: 0.01 ng/mL (ref 0.00–0.08)

## 2017-08-12 MED ORDER — APIXABAN 5 MG PO TABS: 5.0000 mg | ORAL_TABLET | Freq: Two times a day (BID) | ORAL | 0 refills | Status: DC

## 2017-08-12 NOTE — Discharge Instructions (Signed)
Your chest pain evaluation as been reassuring. This pain is likely your anginal pain.  However given your recent catheterization with known lesion that is not amenable to intervention, her cardiologist felt that you would be safe to go home with her your reassuring workup.  please return without fail for worsening symptoms, including recurrent chest pains, difficulty breathing, passing out, confusion or any other symptoms concerning to you.  You have been started on a blood thinner because you are in atrial fibrillation which can increase her stroke risk.  We have arranged for you to be seen in the atrial fibrillation clinic.  They should call you for an appointment within the next few days.  Please also call Dr. Darci Current office to let them know that you are here in the ED.

## 2017-08-19 ENCOUNTER — Ambulatory Visit (HOSPITAL_COMMUNITY)
Admission: RE | Admit: 2017-08-19 | Discharge: 2017-08-19 | Disposition: A | Discharge status: Home / Self Care | Payer: Medicare Other | Source: Ambulatory Visit | Attending: Nurse Practitioner | Admitting: Nurse Practitioner

## 2017-08-19 ENCOUNTER — Encounter (HOSPITAL_COMMUNITY): Payer: Self-pay | Admitting: Nurse Practitioner

## 2017-08-19 VITALS — BP 126/82 | HR 95 | Ht 69.0 in | Wt 212.0 lb

## 2017-08-19 DIAGNOSIS — E785 Hyperlipidemia, unspecified: Secondary | ICD-10-CM | POA: Insufficient documentation

## 2017-08-19 DIAGNOSIS — I252 Old myocardial infarction: Secondary | ICD-10-CM | POA: Diagnosis not present

## 2017-08-19 DIAGNOSIS — I444 Left anterior fascicular block: Secondary | ICD-10-CM | POA: Insufficient documentation

## 2017-08-19 DIAGNOSIS — F4310 Post-traumatic stress disorder, unspecified: Secondary | ICD-10-CM | POA: Insufficient documentation

## 2017-08-19 DIAGNOSIS — G4730 Sleep apnea, unspecified: Secondary | ICD-10-CM | POA: Insufficient documentation

## 2017-08-19 DIAGNOSIS — Z9889 Other specified postprocedural states: Secondary | ICD-10-CM | POA: Diagnosis not present

## 2017-08-19 DIAGNOSIS — M1711 Unilateral primary osteoarthritis, right knee: Secondary | ICD-10-CM | POA: Diagnosis not present

## 2017-08-19 DIAGNOSIS — I4892 Unspecified atrial flutter: Secondary | ICD-10-CM | POA: Insufficient documentation

## 2017-08-19 DIAGNOSIS — F329 Major depressive disorder, single episode, unspecified: Secondary | ICD-10-CM | POA: Diagnosis not present

## 2017-08-19 DIAGNOSIS — I5032 Chronic diastolic (congestive) heart failure: Secondary | ICD-10-CM | POA: Diagnosis not present

## 2017-08-19 DIAGNOSIS — K219 Gastro-esophageal reflux disease without esophagitis: Secondary | ICD-10-CM | POA: Insufficient documentation

## 2017-08-19 DIAGNOSIS — I110 Hypertensive heart disease with heart failure: Secondary | ICD-10-CM | POA: Diagnosis not present

## 2017-08-19 DIAGNOSIS — Z951 Presence of aortocoronary bypass graft: Secondary | ICD-10-CM | POA: Diagnosis not present

## 2017-08-19 DIAGNOSIS — E119 Type 2 diabetes mellitus without complications: Secondary | ICD-10-CM | POA: Diagnosis not present

## 2017-08-19 DIAGNOSIS — Z8673 Personal history of transient ischemic attack (TIA), and cerebral infarction without residual deficits: Secondary | ICD-10-CM | POA: Diagnosis not present

## 2017-08-19 DIAGNOSIS — I2510 Atherosclerotic heart disease of native coronary artery without angina pectoris: Secondary | ICD-10-CM | POA: Diagnosis not present

## 2017-08-19 DIAGNOSIS — Z96651 Presence of right artificial knee joint: Secondary | ICD-10-CM | POA: Insufficient documentation

## 2017-08-19 DIAGNOSIS — Z7982 Long term (current) use of aspirin: Secondary | ICD-10-CM | POA: Diagnosis not present

## 2017-08-19 DIAGNOSIS — Z7984 Long term (current) use of oral hypoglycemic drugs: Secondary | ICD-10-CM | POA: Diagnosis not present

## 2017-08-19 DIAGNOSIS — Z79899 Other long term (current) drug therapy: Secondary | ICD-10-CM | POA: Diagnosis not present

## 2017-08-19 MED ORDER — CARVEDILOL 3.125 MG PO TABS: 6.2500 mg | ORAL_TABLET | Freq: Two times a day (BID) | ORAL | 3 refills | Status: DC

## 2017-08-19 NOTE — Progress Notes (Signed)
Primary Care Physician: Hulan Fess, MD Referring Physician: [facility] ER f/u Cardiologist: Dr. Shelba Flake is a 71 y.o. male with a h/o CABG, DM, obesity- s/p lap banding procedure, Diastolic HF,HTN, minimally invasive MVR 03/2016, that presented to the [facility] ER 12/19 with chest pain. He was found to be in new onset atrial flutter. He had a catherization  September 2018 showing a lesion in D1 that was not amenable to stenting and causing him some intermittent anginal symptoms. He was started on Eliquis 5 mg bid and sent home. In the office, no further chest pain but remains in atrial flutter at 95 bpm. He is noticing fatigue. He gives a history of afib around the time of his valve surgery. Uses cpap for OSA.  Today, he denies symptoms of palpitations, chest pain, shortness of breath, orthopnea, PND, lower extremity edema, dizziness, presyncope, syncope, or neurologic sequela. The patient is tolerating medications without difficulties and is otherwise without complaint today.   Past Medical History:  Diagnosis Date  . Allergic rhinitis   . Anxiety   . Cancer ([place])    hx of skin cancers   . Coronary artery disease    a. s/p MI & CABG; b. s/p PCI Diag;  c. Cath 12/2011: LAD diff dzs/small, Diag 75% isr, 90 dist to stent (small), LCX & RCA occluded, VG->OM 40, VG->PDA patent - med rx.  . Depression   . Diabetes mellitus    ORAL MEDS  . Diastolic CHF, chronic (HCC)    a. EF 55-60% by echo 2007  . Dysrhythmia    "abnormal beat"  . GERD (gastroesophageal reflux disease)    "not anymore" " I had a lap band"  . History of diverticulitis of colon    5 YRS AGO  . History of pneumonia    2017  . History of transient ischemic attack (TIA)    CAROTID DOPPLERS NOV 2011  0-39& BIL. STENOSIS  . Hyperlipidemia   . Hypertension   . Mitral regurgitation   . Myocardial infarction ([place])   . Neuromuscular disorder ([place])    left arm numbness   . OA (osteoarthritis)    RIGHT KNEE ARTHOFIBROSIS  W/ PAIN  (S/P  REPLACEMENT 2004)  . PTSD (post-traumatic stress disorder)    from [place]  . 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 Shortness of breath dyspnea    with exertion  . Sleep apnea    cpap- see ov note in [facility] 05/12/11 for settings    Past Surgical History:  Procedure Laterality Date  . BLEPHAROPLASTY  1985   BILATERAL  . CARDIAC CATHETERIZATION  2001, 2004, 2009, 2011  . CARDIAC CATHETERIZATION N/A 01/23/2016   Procedure: Right/Left Heart Cath and Coronary Angiography;  Surgeon: [name], MD;  Location: [place] [facility];  Service: Cardiovascular;  Laterality: N/A;  . CATARACT EXTRACTION W/ INTRAOCULAR LENS IMPLANT Bilateral   . CHONDROPLASTY  07/22/2011   Procedure: CHONDROPLASTY;  Surgeon: Dione Plover Aluisio;  Location: [place];  Service: Orthopedics;;  . CIRCUMCISION  35 YRS  AGO  . COLONOSCOPY    . CORONARY ANGIOPLASTY  1996-- POST CABG   W/ STENT, last cath 01/07/2012   . CORONARY ARTERY BYPASS GRAFT  1995   X3 VESSEL  . CORONARY STENT PLACEMENT    .  KNEE ARTHROSCOPY  07/22/2011   Procedure: ARTHROSCOPY KNEE;  Surgeon: [name];  Location: [place];  Service: Orthopedics;  Laterality: Right;  WITH DEBRIDEMENT   . KNEE ARTHROSCOPY W/ MENISCECTOMY  X2 IN 2002-- RIGHT KNEE  . LAPAROSCOPIC GASTRIC BANDING  01-15-09  . LEFT HEART CATH AND CORONARY ANGIOGRAPHY N/A 04/29/2017   Procedure: LEFT HEART CATH AND CORONARY ANGIOGRAPHY;  Surgeon: [name], MD;  Location: [place] [facility];  Service: Cardiovascular;  Laterality: N/A;  . LEFT HEART CATHETERIZATION WITH CORONARY ANGIOGRAM N/A 09/11/2013   Procedure: LEFT HEART CATHETERIZATION WITH CORONARY ANGIOGRAM;  Surgeon: [name], MD;  Location: [facility] CATH LAB;  Service:  Cardiovascular;  Laterality: N/A;  . MITRAL VALVE REPAIR Right 03/25/2016   Procedure: MINIMALLY INVASIVE REOPERATION FOR MITRAL VALVE REPAIR  (MVR) with size 26 Sorin Memo 3D;  Surgeon: [name], MD;  Location: [place];  Service: Open Heart Surgery;  Laterality: Right;  . RIGHT KNEE ED COMPARTMENT REPLACEMENT  2004  . SYNOVECTOMY  07/22/2011   Procedure: SYNOVECTOMY;  Surgeon: [name];  Location: [place];  Service: Orthopedics;;  . TEE WITHOUT CARDIOVERSION N/A 01/23/2016   Procedure: TRANSESOPHAGEAL ECHOCARDIOGRAM (TEE);  Surgeon: [name], MD;  Location: [place];  Service: Cardiovascular;  Laterality: N/A;  . TEE WITHOUT CARDIOVERSION N/A 03/25/2016   Procedure: TRANSESOPHAGEAL ECHOCARDIOGRAM (TEE);  Surgeon: [name], MD;  Location: [place];  Service: Open Heart Surgery;  Laterality: N/A;  . UMBILICAL HERNIA REPAIR  01-15-09   W/ GASTRIC BANDING PROCEDURE    Current Outpatient Medications  Medication Sig Dispense Refill  . amoxicillin (AMOXIL) 500 MG tablet Take 4 tablets (2 g) by mouth 30-60 minutes prior to dental appointment (Patient taking differently: Take 2,000 mg by mouth See admin instructions. 2,000 mg 30-60 minutes prior to dental or medical procedures) 12 tablet 0  . apixaban (ELIQUIS) 5 MG TABS tablet Take 1 tablet (5 mg total) by mouth 2 (two) times daily. 60 tablet 0  . Artificial Saliva (DRY MOUTH SPRAY) SOLN 4 sprays every 3 (three) hours as needed (for "dry mouth").    . aspirin EC 81 MG tablet Take 81 mg by mouth daily.    . carvedilol (COREG) 3.125 MG tablet Take 2 tablets (6.25 mg total) by mouth 2 (two) times daily. 180 tablet 3  . cetirizine (ZYRTEC) 10 MG tablet Take 10 mg by mouth daily.    . Cholecalciferol (VITAMIN D) 2000 units CAPS Take 2,000 Units by mouth daily.    . fluticasone (FLONASE) 50 MCG/ACT nasal spray Place 1 spray into both nostrils daily as needed for allergies or rhinitis.    . furosemide (LASIX) 40 MG  tablet Take 2 tablets (80 mg total) by mouth daily. (Patient taking differently: Take 40-80 mg by mouth See admin instructions. 80 mg in the morning and may take an additional 40 mg once a day as needed for swelling or edema) 90 tablet 3  . HYDROcodone-acetaminophen (NORCO) 10-325 MG tablet Take 1 tablet by mouth every 4 (four) hours as needed for moderate pain. 20 tablet 0  . Hypromellose (ARTIFICIAL TEARS OP) Apply 1 drop to eye 2 (two) times daily as needed (dry eyes).    . metFORMIN (GLUCOPHAGE) 500 MG tablet Take 500 mg by mouth 2 (two) times daily with a meal.    . nitroGLYCERIN (NITROSTAT) 0.4 MG SL tablet DISSOLVE ONE TABLET UNDER THE TONGUE EVERY 5 MINUTES AS NEEDED FOR CHEST  PAIN.  DO NOT EXCEED A TOTAL OF 3 DOSES IN 15 MINUTES 25 tablet 3  . PARoxetine (PAXIL) 40 MG tablet Take 20 mg by mouth daily as needed (PTSD symptoms).    . potassium chloride SA (K-DUR,KLOR-CON) 20 MEQ tablet Take 20 mEq by mouth 2 (two) times daily.    . pravastatin (PRAVACHOL) 40 MG tablet Take 1 tablet (40 mg total) by mouth every evening. 90 tablet 3  . ranolazine (RANEXA) 500 MG 12 hr tablet Take 1 tablet (500 mg total) by mouth 2 (two) times daily. 180 tablet 3  . tadalafil (CIALIS) 20 MG tablet Take 20 mg by mouth daily as needed for erectile dysfunction.    . traZODone (DESYREL) 150 MG tablet Take 150 mg by mouth at bedtime.     Marland Kitchen testosterone cypionate (DEPOTESTOSTERONE CYPIONATE) 200 MG/ML injection Inject 200 mg into the muscle once a week.     No current facility-administered medications for this encounter.     Allergies  Allergen Reactions  . Codeine Itching and Other (See Comments)    Extremity tingling-- can take synthetic  . Crestor [Rosuvastatin] Other (See Comments)    Muscle aches   . Lipitor [Atorvastatin] Other (See Comments)    Muscle aches    Social History   Socioeconomic History  . Marital status: Married    Spouse name: Not on file  . Number of children: Not on file  . Years  of education: Not on file  . Highest education level: Not on file  Social Needs  . Financial resource strain: Not on file  . Food insecurity - worry: Not on file  . Food insecurity - inability: Not on file  . Transportation needs - medical: Not on file  . Transportation needs - non-medical: Not on file  Occupational History  . Not on file  Tobacco Use  . Smoking status: Never Smoker  . Smokeless tobacco: Never Used  Substance and Sexual Activity  . Alcohol use: Yes    Comment: OCCASIONAL  . Drug use: No  . Sexual activity: Not on file  Other Topics Concern  . Not on file  Social History Narrative  . Not on file    Family History  Problem Relation Age of Onset  . Other Mother        joint problems  . Hypertension Father   . Heart disease Father   . CVA Father   . Depression Father   . Heart disease Sister        CABG  . Stroke Sister   . Hypertension Sister   . Congestive Heart Failure Sister   . Diabetes Sister   . Heart disease Brother   . Hypertension Brother   . Congestive Heart Failure Brother     ROS- All systems are reviewed and negative except as per the HPI above  Physical Exam: Vitals:   08/19/17 1522  BP: 126/82  Pulse: 95  Weight: 212 lb (96.2 kg)  Height: 5\' 9"  (1.753 m)   Wt Readings from Last 3 Encounters:  08/19/17 212 lb (96.2 kg)  08/11/17 225 lb (102.1 kg)  05/14/17 225 lb (102.1 kg)    Labs: Lab Results  Component Value Date   NA 134 (L) 08/11/2017   K 4.1 08/11/2017   CL 100 (L) 08/11/2017   CO2 26 08/11/2017   GLUCOSE 144 (H) 08/11/2017   BUN 24 (H) 08/11/2017   CREATININE 1.99 (H) 08/11/2017   CALCIUM 9.1 08/11/2017   MG  2.2 03/26/2016   Lab Results  Component Value Date   INR 1.09 04/19/2017   Lab Results  Component Value Date   CHOL 136 10/14/2016   HDL 36 (L) 10/14/2016   LDLCALC 76 10/14/2016   TRIG 122 10/14/2016     GEN- The patient is well appearing, alert and oriented x 3 today.   Head- normocephalic,  atraumatic Eyes-  Sclera clear, conjunctiva pink Ears- hearing intact Oropharynx- clear Neck- supple, no JVP Lymph- no cervical lymphadenopathy Lungs- Clear to ausculation bilaterally, normal work of breathing Heart- Regular rate and rhythm, no murmurs, rubs or gallops, PMI not laterally displaced GI- soft, NT, ND, + BS Extremities- no clubbing, cyanosis, or edema MS- no significant deformity or atrophy Skin- no rash or lesion Psych- euthymic mood, full affect Neuro- strength and sensation are intact  EKG-atrial flutter at 95 bpm, reviewed with Dr. Rayann Heman, questionable if it is typical flutter.  Echo-Study Conclusions  - Left ventricle: The cavity size was normal. There was mild   concentric hypertrophy. Systolic function was normal. The   estimated ejection fraction was in the range of 55% to 60%. Wall   motion was normal; there were no regional wall motion   abnormalities. Doppler parameters are consistent with abnormal   left ventricular relaxation (grade 1 diastolic dysfunction).   Doppler parameters are consistent with high ventricular filling   pressure. - Aortic valve: Transvalvular velocity was within the normal range.   There was no stenosis. There was no regurgitation. - Mitral valve: Calcified annulus. Prior procedures included   surgical repair. The findings are consistent with moderate   stenosis. Valve area by pressure half-time: 1.67 cm^2. - Left atrium: The atrium was severely dilated. - Right ventricle: The cavity size was normal. Wall thickness was   normal. Systolic function was mildly reduced. - Tricuspid valve: There was trivial regurgitation. - Pulmonary arteries: Systolic pressure was within the normal   range. PA peak pressure: 28 mm Hg (S).  Impressions:  - Compared with the echo 03/2016 the mean gradient across the   repaired mitral vavle has decreased from 12 mmHg to 6 mmHg.    Assessment and Plan: 1. New onset atrial flutter/ ?  Typical Discussed with Dr. Rayann Heman and will make an appointment with him to clarify if typical and if he is ablation candidate In the interim, will increase carvedilol to 6.25 mg bid Continue eliquis 5 mg bid for a chadsvasc score of 5, started 12/20 Bleeding precautions discussed  Will see back in one week for increase in BB, with EKG/VS  Butch Penny C. Kemper Hochman, [facility] [address] [place], [place] [address] [phone]

## 2017-08-19 NOTE — Progress Notes (Signed)
Yes, have him see Dr. Rayann Heman first to determine whether he will have an ablation, if not I will set up for DCCV.  Have him see me after he sees Dr. Rayann Heman.

## 2017-08-19 NOTE — Patient Instructions (Signed)
Your physician has recommended you make the following change in your medication:  1)Increase coreg 6.25mg  twice a day

## 2017-08-27 ENCOUNTER — Ambulatory Visit (HOSPITAL_COMMUNITY)
Admission: RE | Admit: 2017-08-27 | Discharge: 2017-08-27 | Disposition: A | Discharge status: Home / Self Care | Payer: Medicare Other | Source: Ambulatory Visit | Attending: Nurse Practitioner | Admitting: Nurse Practitioner

## 2017-08-27 DIAGNOSIS — R9431 Abnormal electrocardiogram [ECG] [EKG]: Secondary | ICD-10-CM | POA: Diagnosis not present

## 2017-08-27 DIAGNOSIS — I4892 Unspecified atrial flutter: Secondary | ICD-10-CM | POA: Insufficient documentation

## 2017-08-27 NOTE — Progress Notes (Addendum)
Pt in for repeat EKG..  To be reviewed by [name], NP  Ekg today shows aflutter with v resonse at 69 bpm vrs in the 90's last week when BB was increased. BP 126/84. He is still pending eval with Dr. Rayann Heman for his opion if he is a flutter candidate. If not he will f/u with Dr. Aundra Dubin for cardioversion. He started Doac 12/20 and has not met 3 week load yet.

## 2017-08-30 ENCOUNTER — Other Ambulatory Visit (HOSPITAL_COMMUNITY): Payer: Self-pay | Admitting: *Deleted

## 2017-08-30 MED ORDER — APIXABAN 5 MG PO TABS: 5.0000 mg | ORAL_TABLET | Freq: Two times a day (BID) | ORAL | 3 refills | Status: DC

## 2017-09-03 ENCOUNTER — Encounter: Payer: Self-pay | Admitting: Internal Medicine

## 2017-09-03 ENCOUNTER — Ambulatory Visit (INDEPENDENT_AMBULATORY_CARE_PROVIDER_SITE_OTHER): Payer: Medicare Other | Admitting: Internal Medicine

## 2017-09-03 VITALS — BP 120/82 | HR 63 | Ht 69.0 in | Wt 211.4 lb

## 2017-09-03 DIAGNOSIS — I4892 Unspecified atrial flutter: Secondary | ICD-10-CM | POA: Diagnosis not present

## 2017-09-03 DIAGNOSIS — I4891 Unspecified atrial fibrillation: Secondary | ICD-10-CM | POA: Diagnosis not present

## 2017-09-03 MED ORDER — APIXABAN 5 MG PO TABS: 5.0000 mg | ORAL_TABLET | Freq: Two times a day (BID) | ORAL | 3 refills | Status: DC

## 2017-09-03 NOTE — Patient Instructions (Addendum)
    Medication Instructions:  Your physician recommends that you continue on your current medications as directed. Please refer to the Current Medication list given to you today.   Labwork: Your physician recommends that you return for lab BMP/CBC   Testing/Procedures: Your physician has recommended that you have a Cardioversion (DCCV). Electrical Cardioversion uses a jolt of electricity to your heart either through paddles or wired patches attached to your chest. This is a controlled, usually prescheduled, procedure. Defibrillation is done under light anesthesia in the hospital, and you usually go home the day of the procedure. This is done to get your heart back into a normal rhythm. You are not awake for the procedure. Please see the instruction sheet given to you today.--09/07/17  Please arrive at [facility] at 11:30am Do not eat or drink after midnight the night prior to the procedure Do not take any medications the morning of the test Will need someone to drive you home at discharge      Follow-Up: Your physician recommends that you schedule a follow-up appointment in: 4 weeks from 09/07/17 with [name], NP in [place] clinic

## 2017-09-03 NOTE — Progress Notes (Signed)
Electrophysiology Office Note   Date:  09/03/2017   ID:  [name], DOB 12/17/1945, MRN [id number]  PCP:  [name], MD  Cardiologist:  Dr Aundra Dubin Primary Electrophysiologist: Thompson Grayer, MD    Chief Complaint  Patient presents with  . Atrial Fibrillation     History of Present Illness: [name] is a 72 y.o. male who presents today for electrophysiology evaluation.   He is referred by [name] NP at Dr [name] for EP consultation regarding atrial arrhythmias.  He has chronic diastolic dysfunction, chronic renal failure, HTN, and prior minimally invasive MVR 03/2016.  He also had CAD with chronic stable angina for which medical management has been advised.  He has OSA and uses CPAP.   He had afib around the time of his surgery but has mostly been without arrhythmias.  Recently he has developed atrial flutter with elevated V rates and symptoms of fatigue and decreased exercise tolerance.  He is now on eliquis.   Today, he denies symptoms of palpitations, chest pain, shortness of breath, orthopnea, PND, lower extremity edema, claudication, dizziness, presyncope, syncope, bleeding, or neurologic sequela. The patient is tolerating medications without difficulties and is otherwise without complaint today.    Past Medical History:  Diagnosis Date  . Allergic rhinitis   . Anxiety   . Cancer ([place])    hx of skin cancers   . Coronary artery disease    a. s/p MI & CABG; b. s/p PCI Diag;  c. Cath 12/2011: LAD diff dzs/small, Diag 75% isr, 90 dist to stent (small), LCX & RCA occluded, VG->OM 40, VG->PDA patent - med rx.  . Depression   . Diabetes mellitus    ORAL MEDS  . Diastolic CHF, chronic (HCC)    a. EF 55-60% by echo 2007  . Dysrhythmia    "abnormal beat"  . GERD (gastroesophageal reflux disease)    "not anymore" " I had a lap band"  . History of diverticulitis of colon    5 YRS AGO  . History of pneumonia    2017  . History of transient ischemic attack  (TIA)    CAROTID DOPPLERS NOV 2011  0-39& BIL. STENOSIS  . Hyperlipidemia   . Hypertension   . Mitral regurgitation   . Myocardial infarction ([place])   . Neuromuscular disorder ([place])    left arm numbness   . 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 PTSD (post-traumatic stress disorder)    from [place]  . 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 Shortness of breath dyspnea    with exertion  . Sleep apnea    cpap- see ov note in [facility] 05/12/11 for settings    Past Surgical History:  Procedure Laterality Date  . BLEPHAROPLASTY  1985   BILATERAL  . CARDIAC CATHETERIZATION  2001, 2004, 2009, 2011  . CARDIAC CATHETERIZATION N/A 01/23/2016   Procedure: Right/Left Heart Cath and Coronary Angiography;  Surgeon: [name], MD;  Location: [place] [facility];  Service: Cardiovascular;  Laterality: N/A;  . CATARACT EXTRACTION W/ INTRAOCULAR LENS IMPLANT Bilateral   . CHONDROPLASTY  07/22/2011   Procedure: CHONDROPLASTY;  Surgeon: Dione Plover Aluisio;  Location: [place];  Service: Orthopedics;;  . CIRCUMCISION  [place]  AGO  . COLONOSCOPY    . CORONARY ANGIOPLASTY  1996-- POST CABG   W/ STENT, last cath 01/07/2012   . CORONARY ARTERY BYPASS GRAFT  1995   X3 VESSEL  . CORONARY STENT PLACEMENT    . KNEE ARTHROSCOPY  07/22/2011   Procedure: ARTHROSCOPY KNEE;  Surgeon: [name];  Location: [place];  Service: Orthopedics;  Laterality: Right;  WITH DEBRIDEMENT   . KNEE ARTHROSCOPY W/ MENISCECTOMY  X2 IN 2002-- RIGHT KNEE  . LAPAROSCOPIC GASTRIC BANDING  01-15-09  . LEFT HEART CATH AND CORONARY ANGIOGRAPHY N/A 04/29/2017   Procedure: LEFT HEART CATH AND CORONARY ANGIOGRAPHY;  Surgeon: [name], MD;  Location:  [place] [facility];  Service: Cardiovascular;  Laterality: N/A;  . LEFT HEART CATHETERIZATION WITH CORONARY ANGIOGRAM N/A 09/11/2013   Procedure: LEFT HEART CATHETERIZATION WITH CORONARY ANGIOGRAM;  Surgeon: [name], MD;  Location: [facility] CATH LAB;  Service: Cardiovascular;  Laterality: N/A;  . MITRAL VALVE REPAIR Right 03/25/2016   Procedure: MINIMALLY INVASIVE REOPERATION FOR MITRAL VALVE REPAIR  (MVR) with size 26 Sorin Memo 3D;  Surgeon: [name], MD;  Location: [place];  Service: Open Heart Surgery;  Laterality: Right;  . RIGHT KNEE ED COMPARTMENT REPLACEMENT  2004  . SYNOVECTOMY  07/22/2011   Procedure: SYNOVECTOMY;  Surgeon: [name];  Location: [place];  Service: Orthopedics;;  . TEE WITHOUT CARDIOVERSION N/A 01/23/2016   Procedure: TRANSESOPHAGEAL ECHOCARDIOGRAM (TEE);  Surgeon: [name], MD;  Location: [place];  Service: Cardiovascular;  Laterality: N/A;  . TEE WITHOUT CARDIOVERSION N/A 03/25/2016   Procedure: TRANSESOPHAGEAL ECHOCARDIOGRAM (TEE);  Surgeon: [name], MD;  Location: [place];  Service: Open Heart Surgery;  Laterality: N/A;  . UMBILICAL HERNIA REPAIR  01-15-09   W/ GASTRIC BANDING PROCEDURE     Current Outpatient Medications  Medication Sig Dispense Refill  . amoxicillin (AMOXIL) 500 MG tablet Take 4 tablets (2 g) by mouth 30-60 minutes prior to dental appointment (Patient taking differently: Take 2,000 mg by mouth See admin instructions. 2,000 mg 30-60 minutes prior to dental or medical procedures) 12 tablet 0  . apixaban (ELIQUIS) 5 MG TABS tablet Take 1 tablet (5 mg total) by mouth 2 (two) times daily. 60 tablet 3  . Artificial Saliva (DRY MOUTH SPRAY) SOLN 4 sprays every 3 (three) hours as needed (for "dry mouth").    . aspirin EC 81 MG tablet Take 81 mg by mouth daily.    . carvedilol (COREG) 3.125 MG tablet Take 2 tablets (6.25 mg total) by mouth 2 (two) times daily. 180 tablet 3  . cetirizine (ZYRTEC) 10 MG tablet  Take 10 mg by mouth daily.    . Cholecalciferol (VITAMIN D) 2000 units CAPS Take 2,000 Units by mouth daily.    . fluticasone (FLONASE) 50 MCG/ACT nasal spray Place 1 spray into both nostrils daily as needed for allergies or rhinitis.    . furosemide (LASIX) 40 MG tablet Take 2 tablets (80 mg total) by mouth daily. (Patient taking differently: Take 40-80 mg by mouth See admin instructions. 80 mg in the morning and may take an additional 40 mg once a day as needed for swelling or edema) 90 tablet 3  . HYDROcodone-acetaminophen (NORCO) 10-325 MG tablet Take 1 tablet by mouth every 4 (four) hours as needed for moderate pain. 20 tablet 0  . Hypromellose (ARTIFICIAL TEARS OP) Apply 1 drop to eye 2 (two) times daily as needed (dry eyes).    Marland Kitchen  metFORMIN (GLUCOPHAGE) 500 MG tablet Take 500 mg by mouth 2 (two) times daily with a meal.    . nitroGLYCERIN (NITROSTAT) 0.4 MG SL tablet DISSOLVE ONE TABLET UNDER THE TONGUE EVERY 5 MINUTES AS NEEDED FOR CHEST PAIN.  DO NOT EXCEED A TOTAL OF 3 DOSES IN 15 MINUTES 25 tablet 3  . PARoxetine (PAXIL) 40 MG tablet Take 20 mg by mouth daily as needed (PTSD symptoms).    . potassium chloride SA (K-DUR,KLOR-CON) 20 MEQ tablet Take 20 mEq by mouth 2 (two) times daily.    . ranolazine (RANEXA) 500 MG 12 hr tablet Take 1 tablet (500 mg total) by mouth 2 (two) times daily. 180 tablet 3  . tadalafil (CIALIS) 20 MG tablet Take 20 mg by mouth daily as needed for erectile dysfunction.    Marland Kitchen testosterone cypionate (DEPOTESTOSTERONE CYPIONATE) 200 MG/ML injection Inject 200 mg into the muscle once a week.    . traZODone (DESYREL) 150 MG tablet Take 150 mg by mouth at bedtime.     . pravastatin (PRAVACHOL) 40 MG tablet Take 1 tablet (40 mg total) by mouth every evening. 90 tablet 3   No current facility-administered medications for this visit.     Allergies:   Codeine; Crestor [rosuvastatin]; and Lipitor [atorvastatin]   Social History:  The patient  reports that  has never  smoked. he has never used smokeless tobacco. He reports that he drinks alcohol. He reports that he does not use drugs.   Family History:  The patient's  family history includes CVA in his father; Congestive Heart Failure in his brother and sister; Depression in his father; Diabetes in his sister; Heart disease in his brother, father, and sister; Hypertension in his brother, father, and sister; Other in his mother; Stroke in his sister.    ROS:  Please see the history of present illness.   All other systems are personally reviewed and negative.    PHYSICAL EXAM: VS:  Ht 5\' 9"  (1.753 m)   BMI 31.31 kg/m  , BMI Body mass index is 31.31 kg/m. GEN: Well nourished, well developed, in no acute distress  HEENT: normal  Neck: no JVD, carotid bruits, or masses Cardiac: iRRR; no murmurs, rubs, or gallops,no edema  Respiratory:  clear to auscultation bilaterally, normal work of breathing GI: soft, nontender, nondistended, + BS MS: no deformity or atrophy  Skin: warm and dry  Neuro:  Strength and sensation are intact Psych: euthymic mood, full affect  EKG:  EKG is ordered today. The ekg ordered today is personally reviewed and shows afib, V rate 63 bpm, nonspecific interventricular conduction delay, nonspecific ST/T changes.  Dr Lovena Le and I have reviewd the ekg today together and both agree that it is afib.  I have reviewed AF clinic visit ekg which reveals atrial flutter   Recent Labs: 08/11/2017: BUN 24; Creatinine, Ser 1.99; Hemoglobin 12.6; Platelets 221; Potassium 4.1; Sodium 134  personally reviewed   Lipid Panel     Component Value Date/Time   CHOL 136 10/14/2016 1230   TRIG 122 10/14/2016 1230   HDL 36 (L) 10/14/2016 1230   CHOLHDL 3.8 10/14/2016 1230   CHOLHDL 5.3 (H) 08/07/2016 0940   VLDL 69 (H) 08/07/2016 0940   LDLCALC 76 10/14/2016 1230   personally reviewed   Wt Readings from Last 3 Encounters:  08/19/17 212 lb (96.2 kg)  08/11/17 225 lb (102.1 kg)  05/14/17 225  lb (102.1 kg)      Other studies personally reviewed: Additional studies/  records that were reviewed today include:  Prior operative note, office notes, ekgs, echo  Review of the above records today demonstrates: as abovwe   ASSESSMENT AND PLAN:  1.  Persistent Atrial fibrillation/ atrial flutter The patient recently presented to the AF clinic with atrial flutter but is now in afib.  He is s/p prior MV repair, without maze or LAA amputation.  He is appropriately anticoagulated with eliquis for chads2vasc score of at least 7.  I would advise long term anticoagulation. As this is his first arrhythmia since recovery from MAZE in 2017, I would advise cardioversion alone.  If he remains in sinus rhythm then a conservative approach is best.  If he has further atrial arrhythmias then AAD should be considered though given renal failure, our options are limited to sotalol or amiodarone.  I have  Spoken with Dr Aundra Dubin this am and formulated plan with him.  Will arrange cardioversion with Dr Aundra Dubin in the next week.  Importance of compliance with anticoagulation was stressed today.  2. OSA Compliance with CPAP encouraged  Follow-up:  AF clinic 4 weeks after cardioversion.  Current medicines are reviewed at length with the patient today.   The patient does not have concerns regarding his medicines.  The following changes were made today:  none   Signed, Thompson Grayer, MD  09/03/2017 8:47 AM     [facility] [address] [place] [address] [phone] (office) [phone] (fax)

## 2017-09-03 NOTE — H&P (View-Only) (Signed)
Electrophysiology Office Note   Date:  09/03/2017   ID:  [name], DOB 19-Jun-1946, MRN [id number]  PCP:  [name], MD  Cardiologist:  Dr Aundra Dubin Primary Electrophysiologist: Thompson Grayer, MD    Chief Complaint  Patient presents with  . Atrial Fibrillation     History of Present Illness: [name] is a 72 y.o. male who presents today for electrophysiology evaluation.   He is referred by [name] NP at Dr [name] for EP consultation regarding atrial arrhythmias.  He has chronic diastolic dysfunction, chronic renal failure, HTN, and prior minimally invasive MVR 03/2016.  He also had CAD with chronic stable angina for which medical management has been advised.  He has OSA and uses CPAP.   He had afib around the time of his surgery but has mostly been without arrhythmias.  Recently he has developed atrial flutter with elevated V rates and symptoms of fatigue and decreased exercise tolerance.  He is now on eliquis.   Today, he denies symptoms of palpitations, chest pain, shortness of breath, orthopnea, PND, lower extremity edema, claudication, dizziness, presyncope, syncope, bleeding, or neurologic sequela. The patient is tolerating medications without difficulties and is otherwise without complaint today.    Past Medical History:  Diagnosis Date  . Allergic rhinitis   . Anxiety   . Cancer ([place])    hx of skin cancers   . Coronary artery disease    a. s/p MI & CABG; b. s/p PCI Diag;  c. Cath 12/2011: LAD diff dzs/small, Diag 75% isr, 90 dist to stent (small), LCX & RCA occluded, VG->OM 40, VG->PDA patent - med rx.  . Depression   . Diabetes mellitus    ORAL MEDS  . Diastolic CHF, chronic (HCC)    a. EF 55-60% by echo 2007  . Dysrhythmia    "abnormal beat"  . GERD (gastroesophageal reflux disease)    "not anymore" " I had a lap band"  . History of diverticulitis of colon    5 YRS AGO  . History of pneumonia    2017  . History of transient ischemic attack  (TIA)    CAROTID DOPPLERS NOV 2011  0-39& BIL. STENOSIS  . Hyperlipidemia   . Hypertension   . Mitral regurgitation   . Myocardial infarction ([place])   . Neuromuscular disorder ([place])    left arm numbness   . 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 PTSD (post-traumatic stress disorder)    from [place]  . 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 Shortness of breath dyspnea    with exertion  . Sleep apnea    cpap- see ov note in [facility] 05/12/11 for settings    Past Surgical History:  Procedure Laterality Date  . BLEPHAROPLASTY  1985   BILATERAL  . CARDIAC CATHETERIZATION  2001, 2004, 2009, 2011  . CARDIAC CATHETERIZATION N/A 01/23/2016   Procedure: Right/Left Heart Cath and Coronary Angiography;  Surgeon: [name], MD;  Location: [place] [facility];  Service: Cardiovascular;  Laterality: N/A;  . CATARACT EXTRACTION W/ INTRAOCULAR LENS IMPLANT Bilateral   . CHONDROPLASTY  07/22/2011   Procedure: CHONDROPLASTY;  Surgeon: Dione Plover Aluisio;  Location: [place];  Service: Orthopedics;;  . CIRCUMCISION  [place]  AGO  . COLONOSCOPY    . CORONARY ANGIOPLASTY  1996-- POST CABG   W/ STENT, last cath 01/07/2012   . CORONARY ARTERY BYPASS GRAFT  1995   X3 VESSEL  . CORONARY STENT PLACEMENT    . KNEE ARTHROSCOPY  07/22/2011   Procedure: ARTHROSCOPY KNEE;  Surgeon: [name];  Location: [place];  Service: Orthopedics;  Laterality: Right;  WITH DEBRIDEMENT   . KNEE ARTHROSCOPY W/ MENISCECTOMY  X2 IN 2002-- RIGHT KNEE  . LAPAROSCOPIC GASTRIC BANDING  01-15-09  . LEFT HEART CATH AND CORONARY ANGIOGRAPHY N/A 04/29/2017   Procedure: LEFT HEART CATH AND CORONARY ANGIOGRAPHY;  Surgeon: [name], MD;  Location:  [place] [facility];  Service: Cardiovascular;  Laterality: N/A;  . LEFT HEART CATHETERIZATION WITH CORONARY ANGIOGRAM N/A 09/11/2013   Procedure: LEFT HEART CATHETERIZATION WITH CORONARY ANGIOGRAM;  Surgeon: [name], MD;  Location: [facility] C[facility];  Service: Cardiovascular;  Laterality: N/A;  . MITRAL VALVE REPAIR Right 03/25/2016   Procedure: MINIMALLY INVASIVE REOPERATION FOR MITRAL VALVE REPAIR  (MVR) with size 26 Sorin Memo 3D;  Surgeon: [name], MD;  Location: [place];  Service: Open Heart Surgery;  Laterality: Right;  . RIGHT KNEE ED COMPARTMENT REPLACEMENT  2004  . SYNOVECTOMY  07/22/2011   Procedure: SYNOVECTOMY;  Surgeon: [name];  Location: [employer];  Service: Orthopedics;;  . TEE WITHOUT CARDIOVERSION N/A 01/23/2016   Procedure: TRANSESOPHAGEAL ECHOCARDIOGRAM (TEE);  Surgeon: [name], MD;  Location: [place];  Service: Cardiovascular;  Laterality: N/A;  . TEE WITHOUT CARDIOVERSION N/A 03/25/2016   Procedure: TRANSESOPHAGEAL ECHOCARDIOGRAM (TEE);  Surgeon: [name], MD;  Location: [place];  Service: Open Heart Surgery;  Laterality: N/A;  . UMBILICAL HERNIA REPAIR  01-15-09   W/ GASTRIC BANDING PROCEDURE     Current Outpatient Medications  Medication Sig Dispense Refill  . amoxicillin (AMOXIL) 500 MG tablet Take 4 tablets (2 g) by mouth 30-60 minutes prior to dental appointment (Patient taking differently: Take 2,000 mg by mouth See admin instructions. 2,000 mg 30-60 minutes prior to dental or medical procedures) 12 tablet 0  . apixaban (ELIQUIS) 5 MG TABS tablet Take 1 tablet (5 mg total) by mouth 2 (two) times daily. 60 tablet 3  . Artificial Saliva (DRY MOUTH SPRAY) SOLN 4 sprays every 3 (three) hours as needed (for "dry mouth").    . aspirin EC 81 MG tablet Take 81 mg by mouth daily.    . carvedilol (COREG) 3.125 MG tablet Take 2 tablets (6.25 mg total) by mouth 2 (two) times daily. 180 tablet 3  . cetirizine (ZYRTEC) 10 MG tablet  Take 10 mg by mouth daily.    . Cholecalciferol (VITAMIN D) 2000 units CAPS Take 2,000 Units by mouth daily.    . fluticasone (FLONASE) 50 MCG/ACT nasal spray Place 1 spray into both nostrils daily as needed for allergies or rhinitis.    . furosemide (LASIX) 40 MG tablet Take 2 tablets (80 mg total) by mouth daily. (Patient taking differently: Take 40-80 mg by mouth See admin instructions. 80 mg in the morning and may take an additional 40 mg once a day as needed for swelling or edema) 90 tablet 3  . HYDROcodone-acetaminophen (NORCO) 10-325 MG tablet Take 1 tablet by mouth every 4 (four) hours as needed for moderate pain. 20 tablet 0  . Hypromellose (ARTIFICIAL TEARS OP) Apply 1 drop to eye 2 (two) times daily as needed (dry eyes).    Marland Kitchen  metFORMIN (GLUCOPHAGE) 500 MG tablet Take 500 mg by mouth 2 (two) times daily with a meal.    . nitroGLYCERIN (NITROSTAT) 0.4 MG SL tablet DISSOLVE ONE TABLET UNDER THE TONGUE EVERY 5 MINUTES AS NEEDED FOR CHEST PAIN.  DO NOT EXCEED A TOTAL OF 3 DOSES IN 15 MINUTES 25 tablet 3  . PARoxetine (PAXIL) 40 MG tablet Take 20 mg by mouth daily as needed (PTSD symptoms).    . potassium chloride SA (K-DUR,KLOR-CON) 20 MEQ tablet Take 20 mEq by mouth 2 (two) times daily.    . ranolazine (RANEXA) 500 MG 12 hr tablet Take 1 tablet (500 mg total) by mouth 2 (two) times daily. 180 tablet 3  . tadalafil (CIALIS) 20 MG tablet Take 20 mg by mouth daily as needed for erectile dysfunction.    Marland Kitchen testosterone cypionate (DEPOTESTOSTERONE CYPIONATE) 200 MG/ML injection Inject 200 mg into the muscle once a week.    . traZODone (DESYREL) 150 MG tablet Take 150 mg by mouth at bedtime.     . pravastatin (PRAVACHOL) 40 MG tablet Take 1 tablet (40 mg total) by mouth every evening. 90 tablet 3   No current facility-administered medications for this visit.     Allergies:   Codeine; Crestor [rosuvastatin]; and Lipitor [atorvastatin]   Social History:  The patient  reports that  has never  smoked. he has never used smokeless tobacco. He reports that he drinks alcohol. He reports that he does not use drugs.   Family History:  The patient's  family history includes CVA in his father; Congestive Heart Failure in his brother and sister; Depression in his father; Diabetes in his sister; Heart disease in his brother, father, and sister; Hypertension in his brother, father, and sister; Other in his mother; Stroke in his sister.    ROS:  Please see the history of present illness.   All other systems are personally reviewed and negative.    PHYSICAL EXAM: VS:  Ht 5\' 9"  (1.753 m)   BMI 31.31 kg/m  , BMI Body mass index is 31.31 kg/m. GEN: Well nourished, well developed, in no acute distress  HEENT: normal  Neck: no JVD, carotid bruits, or masses Cardiac: iRRR; no murmurs, rubs, or gallops,no edema  Respiratory:  clear to auscultation bilaterally, normal work of breathing GI: soft, nontender, nondistended, + BS MS: no deformity or atrophy  Skin: warm and dry  Neuro:  Strength and sensation are intact Psych: euthymic mood, full affect  EKG:  EKG is ordered today. The ekg ordered today is personally reviewed and shows afib, V rate 63 bpm, nonspecific interventricular conduction delay, nonspecific ST/T changes.  Dr Lovena Le and I have reviewd the ekg today together and both agree that it is afib.  I have reviewed AF clinic visit ekg which reveals atrial flutter   Recent Labs: 08/11/2017: BUN 24; Creatinine, Ser 1.99; Hemoglobin 12.6; Platelets 221; Potassium 4.1; Sodium 134  personally reviewed   Lipid Panel     Component Value Date/Time   CHOL 136 10/14/2016 1230   TRIG 122 10/14/2016 1230   HDL 36 (L) 10/14/2016 1230   CHOLHDL 3.8 10/14/2016 1230   CHOLHDL 5.3 (H) 08/07/2016 0940   VLDL 69 (H) 08/07/2016 0940   LDLCALC 76 10/14/2016 1230   personally reviewed   Wt Readings from Last 3 Encounters:  08/19/17 212 lb (96.2 kg)  08/11/17 225 lb (102.1 kg)  05/14/17 225  lb (102.1 kg)      Other studies personally reviewed: Additional studies/  records that were reviewed today include:  Prior operative note, office notes, ekgs, echo  Review of the above records today demonstrates: as abovwe   ASSESSMENT AND PLAN:  1.  Persistent Atrial fibrillation/ atrial flutter The patient recently presented to the AF clinic with atrial flutter but is now in afib.  He is s/p prior MV repair, without maze or LAA amputation.  He is appropriately anticoagulated with eliquis for chads2vasc score of at least 7.  I would advise long term anticoagulation. As this is his first arrhythmia since recovery from MAZE in 2017, I would advise cardioversion alone.  If he remains in sinus rhythm then a conservative approach is best.  If he has further atrial arrhythmias then AAD should be considered though given renal failure, our options are limited to sotalol or amiodarone.  I have  Spoken with Dr Aundra Dubin this am and formulated plan with him.  Will arrange cardioversion with Dr Aundra Dubin in the next week.  Importance of compliance with anticoagulation was stressed today.  2. OSA Compliance with CPAP encouraged  Follow-up:  AF clinic 4 weeks after cardioversion.  Current medicines are reviewed at length with the patient today.   The patient does not have concerns regarding his medicines.  The following changes were made today:  none   Signed, Thompson Grayer, MD  09/03/2017 8:47 AM     [facility] [address] [place] [address] [phone] (office) [phone] (fax)

## 2017-09-04 LAB — CBC WITH DIFFERENTIAL/PLATELET
Basophils Absolute: 0 10*3/uL (ref 0.0–0.2)
Basos: 1 %
EOS (ABSOLUTE): 0.4 10*3/uL (ref 0.0–0.4)
EOS: 6 %
HEMOGLOBIN: 14 g/dL (ref 13.0–17.7)
Hematocrit: 42.5 % (ref 37.5–51.0)
Immature Grans (Abs): 0 10*3/uL (ref 0.0–0.1)
Immature Granulocytes: 1 %
Lymphocytes Absolute: 1.1 10*3/uL (ref 0.7–3.1)
Lymphs: 18 %
MCH: 28.2 pg (ref 26.6–33.0)
MCHC: 32.9 g/dL (ref 31.5–35.7)
MCV: 86 fL (ref 79–97)
MONOCYTES: 7 %
MONOS ABS: 0.4 10*3/uL (ref 0.1–0.9)
NEUTROS ABS: 4.4 10*3/uL (ref 1.4–7.0)
Neutrophils: 67 %
Platelets: 200 10*3/uL (ref 150–379)
RBC: 4.97 x10E6/uL (ref 4.14–5.80)
RDW: 19.2 % — AB (ref 12.3–15.4)
WBC: 6.5 10*3/uL (ref 3.4–10.8)

## 2017-09-04 LAB — BASIC METABOLIC PANEL
BUN / CREAT RATIO: 14 (ref 10–24)
BUN: 24 mg/dL (ref 8–27)
CO2: 24 mmol/L (ref 20–29)
CREATININE: 1.76 mg/dL — AB (ref 0.76–1.27)
Calcium: 9.8 mg/dL (ref 8.6–10.2)
Chloride: 100 mmol/L (ref 96–106)
GFR calc non Af Amer: 38 mL/min/{1.73_m2} — ABNORMAL LOW (ref >59)
GFR, EST AFRICAN AMERICAN: 44 mL/min/{1.73_m2} — AB (ref >59)
GLUCOSE: 162 mg/dL — AB (ref 65–99)
Potassium: 4.9 mmol/L (ref 3.5–5.2)
SODIUM: 140 mmol/L (ref 134–144)

## 2017-09-06 DIAGNOSIS — M25572 Pain in left ankle and joints of left foot: Secondary | ICD-10-CM | POA: Insufficient documentation

## 2017-09-06 DIAGNOSIS — M25571 Pain in right ankle and joints of right foot: Secondary | ICD-10-CM | POA: Diagnosis not present

## 2017-09-07 ENCOUNTER — Encounter (HOSPITAL_COMMUNITY): Payer: Self-pay | Admitting: *Deleted

## 2017-09-07 ENCOUNTER — Ambulatory Visit (HOSPITAL_COMMUNITY)
Admission: RE | Admit: 2017-09-07 | Discharge: 2017-09-07 | Disposition: A | Discharge status: Home / Self Care | Payer: Medicare Other | Source: Ambulatory Visit | Attending: Cardiology | Admitting: Cardiology

## 2017-09-07 ENCOUNTER — Encounter (HOSPITAL_COMMUNITY)
Admission: RE | Disposition: A | Discharge status: Home / Self Care | Payer: Self-pay | Source: Ambulatory Visit | Attending: Cardiology

## 2017-09-07 ENCOUNTER — Ambulatory Visit (HOSPITAL_COMMUNITY): Payer: Medicare Other | Admitting: Anesthesiology

## 2017-09-07 ENCOUNTER — Other Ambulatory Visit: Payer: Self-pay

## 2017-09-07 DIAGNOSIS — I110 Hypertensive heart disease with heart failure: Secondary | ICD-10-CM | POA: Diagnosis not present

## 2017-09-07 DIAGNOSIS — I2510 Atherosclerotic heart disease of native coronary artery without angina pectoris: Secondary | ICD-10-CM | POA: Diagnosis not present

## 2017-09-07 DIAGNOSIS — I130 Hypertensive heart and chronic kidney disease with heart failure and stage 1 through stage 4 chronic kidney disease, or unspecified chronic kidney disease: Secondary | ICD-10-CM | POA: Diagnosis not present

## 2017-09-07 DIAGNOSIS — I252 Old myocardial infarction: Secondary | ICD-10-CM | POA: Insufficient documentation

## 2017-09-07 DIAGNOSIS — E119 Type 2 diabetes mellitus without complications: Secondary | ICD-10-CM | POA: Insufficient documentation

## 2017-09-07 DIAGNOSIS — Z955 Presence of coronary angioplasty implant and graft: Secondary | ICD-10-CM | POA: Diagnosis not present

## 2017-09-07 DIAGNOSIS — Z8673 Personal history of transient ischemic attack (TIA), and cerebral infarction without residual deficits: Secondary | ICD-10-CM | POA: Insufficient documentation

## 2017-09-07 DIAGNOSIS — I4891 Unspecified atrial fibrillation: Secondary | ICD-10-CM | POA: Diagnosis not present

## 2017-09-07 DIAGNOSIS — I481 Persistent atrial fibrillation: Secondary | ICD-10-CM | POA: Diagnosis not present

## 2017-09-07 DIAGNOSIS — G4733 Obstructive sleep apnea (adult) (pediatric): Secondary | ICD-10-CM | POA: Diagnosis not present

## 2017-09-07 DIAGNOSIS — Z9884 Bariatric surgery status: Secondary | ICD-10-CM | POA: Insufficient documentation

## 2017-09-07 DIAGNOSIS — Z7982 Long term (current) use of aspirin: Secondary | ICD-10-CM | POA: Diagnosis not present

## 2017-09-07 DIAGNOSIS — Z951 Presence of aortocoronary bypass graft: Secondary | ICD-10-CM | POA: Diagnosis not present

## 2017-09-07 DIAGNOSIS — Z823 Family history of stroke: Secondary | ICD-10-CM | POA: Diagnosis not present

## 2017-09-07 DIAGNOSIS — I4892 Unspecified atrial flutter: Secondary | ICD-10-CM | POA: Insufficient documentation

## 2017-09-07 DIAGNOSIS — Z79899 Other long term (current) drug therapy: Secondary | ICD-10-CM | POA: Diagnosis not present

## 2017-09-07 DIAGNOSIS — I340 Nonrheumatic mitral (valve) insufficiency: Secondary | ICD-10-CM | POA: Insufficient documentation

## 2017-09-07 DIAGNOSIS — Z9989 Dependence on other enabling machines and devices: Secondary | ICD-10-CM | POA: Insufficient documentation

## 2017-09-07 DIAGNOSIS — Z7984 Long term (current) use of oral hypoglycemic drugs: Secondary | ICD-10-CM | POA: Diagnosis not present

## 2017-09-07 DIAGNOSIS — F329 Major depressive disorder, single episode, unspecified: Secondary | ICD-10-CM | POA: Diagnosis not present

## 2017-09-07 DIAGNOSIS — Z8249 Family history of ischemic heart disease and other diseases of the circulatory system: Secondary | ICD-10-CM | POA: Diagnosis not present

## 2017-09-07 DIAGNOSIS — Z85828 Personal history of other malignant neoplasm of skin: Secondary | ICD-10-CM | POA: Insufficient documentation

## 2017-09-07 DIAGNOSIS — E785 Hyperlipidemia, unspecified: Secondary | ICD-10-CM | POA: Diagnosis not present

## 2017-09-07 DIAGNOSIS — N189 Chronic kidney disease, unspecified: Secondary | ICD-10-CM | POA: Diagnosis not present

## 2017-09-07 DIAGNOSIS — I5032 Chronic diastolic (congestive) heart failure: Secondary | ICD-10-CM | POA: Diagnosis not present

## 2017-09-07 DIAGNOSIS — Z96651 Presence of right artificial knee joint: Secondary | ICD-10-CM | POA: Insufficient documentation

## 2017-09-07 HISTORY — PX: CARDIOVERSION: SHX1299

## 2017-09-07 LAB — GLUCOSE, CAPILLARY: GLUCOSE-CAPILLARY: 153 mg/dL — AB (ref 65–99)

## 2017-09-07 SURGERY — CARDIOVERSION
Anesthesia: General

## 2017-09-07 MED ORDER — LIDOCAINE 2% (20 MG/ML) 5 ML SYRINGE
INTRAMUSCULAR | Status: DC | PRN
  Administered 2017-09-07: 40 mg via INTRAVENOUS

## 2017-09-07 MED ORDER — SODIUM CHLORIDE 0.9 % IV SOLN
INTRAVENOUS | Status: DC
  Administered 2017-09-07: 13:00:00 via INTRAVENOUS

## 2017-09-07 MED ORDER — APIXABAN 5 MG PO TABS
5.0000 mg | ORAL_TABLET | Freq: Once | ORAL | Status: AC
  Administered 2017-09-07: 5 mg via ORAL
  Filled 2017-09-07: qty 1

## 2017-09-07 MED ORDER — PROPOFOL 10 MG/ML IV BOLUS
INTRAVENOUS | Status: DC | PRN
  Administered 2017-09-07: 60 mg via INTRAVENOUS

## 2017-09-07 NOTE — Procedures (Signed)
Electrical Cardioversion Procedure Note [name] [id number] 02-01-1946  Procedure: Electrical Cardioversion Indications:  Atrial Fibrillation  Procedure Details Consent: Risks of procedure as well as the alternatives and risks of each were explained to the (patient/caregiver).  Consent for procedure obtained. Time Out: Verified patient identification, verified procedure, site/side was marked, verified correct patient position, special equipment/implants available, medications/allergies/relevent history reviewed, required imaging and test results available.  Performed  Patient placed on cardiac monitor, pulse oximetry, supplemental oxygen as necessary.  Sedation given: Propofol per anesthesiology. Pacer pads placed anterior and posterior chest.  Cardioverted 1 time(s).  Cardioverted at [place].  Evaluation Findings: Post procedure EKG shows: NSR Complications: None Patient did tolerate procedure well.   [name] 09/07/2017, 1:40 PM

## 2017-09-07 NOTE — Anesthesia Preprocedure Evaluation (Addendum)
Anesthesia Evaluation  Patient identified by MRN, date of birth, ID band Patient awake    Reviewed: Allergy & Precautions, NPO status , Patient's Chart, lab work & pertinent test results  Airway Mallampati: II  TM Distance: >3 FB Neck ROM: Full    Dental  (+) Teeth Intact   Pulmonary    breath sounds clear to auscultation       Cardiovascular hypertension,  Rhythm:Irregular Rate:Normal     Neuro/Psych    GI/Hepatic   Endo/Other  diabetes  Renal/GU      Musculoskeletal   Abdominal   Peds  Hematology   Anesthesia Other Findings   Reproductive/Obstetrics                            Anesthesia Physical Anesthesia Plan  ASA: III  Anesthesia Plan: General   Post-op Pain Management:    Induction: Intravenous  PONV Risk Score and Plan:   Airway Management Planned: Mask  Additional Equipment:   Intra-op Plan:   Post-operative Plan:   Informed Consent: I have reviewed the patients History and Physical, chart, labs and discussed the procedure including the risks, benefits and alternatives for the proposed anesthesia with the patient or authorized representative who has indicated his/her understanding and acceptance.     Plan Discussed with: CRNA and Anesthesiologist  Anesthesia Plan Comments:         Anesthesia Quick Evaluation

## 2017-09-07 NOTE — Discharge Instructions (Signed)
Electrical Cardioversion, Care After °This sheet gives you information about how to care for yourself after your procedure. Your health care provider may also give you more specific instructions. If you have problems or questions, contact your health care provider. °What can I expect after the procedure? °After the procedure, it is common to have: °· Some redness on the skin where the shocks were given. ° °Follow these instructions at home: °· Do not drive for 24 hours if you were given a medicine to help you relax (sedative). °· Take over-the-counter and prescription medicines only as told by your health care provider. °· Ask your health care provider how to check your pulse. Check it often. °· Rest for 48 hours after the procedure or as told by your health care provider. °· Avoid or limit your caffeine use as told by your health care provider. °Contact a health care provider if: °· You feel like your heart is beating too quickly or your pulse is not regular. °· You have a serious muscle cramp that does not go away. °Get help right away if: °· You have discomfort in your chest. °· You are dizzy or you feel faint. °· You have trouble breathing or you are short of breath. °· Your speech is slurred. °· You have trouble moving an arm or leg on one side of your body. °· Your fingers or toes turn cold or blue. °This information is not intended to replace advice given to you by your health care provider. Make sure you discuss any questions you have with your health care provider. °Document Released: 05/31/2013 Document Revised: 03/13/2016 Document Reviewed: 02/14/2016 °Elsevier Interactive Patient Education © 2018 [employer] Inc[employer] ° °

## 2017-09-07 NOTE — Transfer of Care (Signed)
Immediate Anesthesia Transfer of Care Note  Patient: [name]  Procedure(s) Performed: CARDIOVERSION (N/A )  Patient Location: Endoscopy Unit  Anesthesia Type:General  Level of Consciousness: awake, alert  and oriented  Airway & Oxygen Therapy: Patient Spontanous Breathing and Patient connected to nasal cannula oxygen  Post-op Assessment: Report given to RN and Post -op Vital signs reviewed and stable  Post vital signs: Reviewed and stable  Last Vitals:  Vitals:   09/07/17 1219  BP: 125/65  Pulse: 72  Resp: 20  Temp: 36.6 C  SpO2: 96%    Last Pain:  Vitals:   09/07/17 1219  TempSrc: Oral  PainSc:          Complications: No apparent anesthesia complications

## 2017-09-07 NOTE — Anesthesia Postprocedure Evaluation (Signed)
Anesthesia Post Note  Patient: [name]  Procedure(s) Performed: CARDIOVERSION (N/A )     Patient location during evaluation: Endoscopy Anesthesia Type: General Level of consciousness: awake and alert Pain management: pain level controlled Vital Signs Assessment: post-procedure vital signs reviewed and stable Respiratory status: spontaneous breathing, nonlabored ventilation, respiratory function stable and patient connected to nasal cannula oxygen Cardiovascular status: blood pressure returned to baseline and stable Postop Assessment: no apparent nausea or vomiting Anesthetic complications: no    Last Vitals:  Vitals:   09/07/17 1349 09/07/17 1355  BP:  126/81  Pulse: 79 83  Resp: 16 13  Temp: 36.7 C   SpO2: 98% 96%    Last Pain:  Vitals:   09/07/17 1349  TempSrc: Tympanic  PainSc:                  Alyscia Carmon COKER

## 2017-09-07 NOTE — Interval H&P Note (Signed)
History and Physical Interval Note:  09/07/2017 1:28 PM  [name]  has presented today for surgery, with the diagnosis of a-fib  The various methods of treatment have been discussed with the patient and family. After consideration of risks, benefits and other options for treatment, the patient has consented to  Procedure(s): CARDIOVERSION (N/A) as a surgical intervention .  The patient's history has been reviewed, patient examined, no change in status, stable for surgery.  I have reviewed the patient's chart and labs.  Questions were answered to the patient's satisfaction.     Alonnie Bieker [employer]

## 2017-09-08 ENCOUNTER — Encounter (HOSPITAL_COMMUNITY): Payer: Self-pay | Admitting: Cardiology

## 2017-09-09 ENCOUNTER — Other Ambulatory Visit (HOSPITAL_COMMUNITY): Payer: Self-pay | Admitting: *Deleted

## 2017-09-09 ENCOUNTER — Telehealth (HOSPITAL_COMMUNITY): Payer: Self-pay | Admitting: Pharmacist

## 2017-09-09 MED ORDER — APIXABAN 5 MG PO TABS: 5.0000 mg | ORAL_TABLET | Freq: Two times a day (BID) | ORAL | 3 refills | Status: DC

## 2017-09-09 NOTE — Telephone Encounter (Signed)
[name] stated that he cannot afford the >$200 copay for his Eliquis. Unfortunately, he has a deductible to meet with his Part D insurance and will need to meet this deductible before his copays will be reduced. He stated that he wants to try to get his Eliquis at the [place] so will print the Rx for him to take the [place].   Ruta Hinds. Velva Harman, PharmD, BCPS, CPP Clinical Pharmacist Phone: [phone] 09/09/2017 9:50 AM

## 2017-09-16 ENCOUNTER — Telehealth (HOSPITAL_COMMUNITY): Payer: Self-pay | Admitting: *Deleted

## 2017-09-16 NOTE — Telephone Encounter (Signed)
He should not feel sore anywhere other than his chest where the pad was with the cardioversion.  If he has a burn from the cardioversion can use hydrocortisone cream.  Soreness in other areas is probably something other than from the cardioversion.

## 2017-09-16 NOTE — Telephone Encounter (Signed)
Patient called complaining of still being sore all over since his cardioversion last week.  He takes tylenol but it only helps for a short period of time.  He is concerned and wanted me to ask Dr. Aundra Dubin if this was to be expected.    Will forward to Dr. Aundra Dubin and will call him back with his response.

## 2017-09-17 NOTE — Telephone Encounter (Signed)
Called and spoke with patient, no burn marks present.  I told him if the soreness continues or becomes worse he can call his PCP or go to an urgent care.  He verbalized understanding and no further questions.

## 2017-10-01 ENCOUNTER — Other Ambulatory Visit: Payer: Self-pay

## 2017-10-01 ENCOUNTER — Ambulatory Visit (HOSPITAL_COMMUNITY)
Admission: RE | Admit: 2017-10-01 | Discharge: 2017-10-01 | Disposition: A | Discharge status: Home / Self Care | Payer: Medicare Other | Source: Ambulatory Visit | Attending: Cardiology | Admitting: Cardiology

## 2017-10-01 ENCOUNTER — Encounter (HOSPITAL_COMMUNITY): Payer: Self-pay | Admitting: Cardiology

## 2017-10-01 VITALS — BP 127/80 | HR 70 | Wt 213.0 lb

## 2017-10-01 DIAGNOSIS — Z79899 Other long term (current) drug therapy: Secondary | ICD-10-CM | POA: Insufficient documentation

## 2017-10-01 DIAGNOSIS — G4733 Obstructive sleep apnea (adult) (pediatric): Secondary | ICD-10-CM | POA: Diagnosis not present

## 2017-10-01 DIAGNOSIS — I5032 Chronic diastolic (congestive) heart failure: Secondary | ICD-10-CM | POA: Diagnosis not present

## 2017-10-01 DIAGNOSIS — Z7984 Long term (current) use of oral hypoglycemic drugs: Secondary | ICD-10-CM | POA: Diagnosis not present

## 2017-10-01 DIAGNOSIS — E1122 Type 2 diabetes mellitus with diabetic chronic kidney disease: Secondary | ICD-10-CM | POA: Diagnosis not present

## 2017-10-01 DIAGNOSIS — Z7901 Long term (current) use of anticoagulants: Secondary | ICD-10-CM | POA: Insufficient documentation

## 2017-10-01 DIAGNOSIS — Z8673 Personal history of transient ischemic attack (TIA), and cerebral infarction without residual deficits: Secondary | ICD-10-CM | POA: Diagnosis not present

## 2017-10-01 DIAGNOSIS — I4891 Unspecified atrial fibrillation: Secondary | ICD-10-CM | POA: Diagnosis not present

## 2017-10-01 DIAGNOSIS — Z6831 Body mass index (BMI) 31.0-31.9, adult: Secondary | ICD-10-CM | POA: Diagnosis not present

## 2017-10-01 DIAGNOSIS — I130 Hypertensive heart and chronic kidney disease with heart failure and stage 1 through stage 4 chronic kidney disease, or unspecified chronic kidney disease: Secondary | ICD-10-CM | POA: Diagnosis not present

## 2017-10-01 DIAGNOSIS — Z9889 Other specified postprocedural states: Secondary | ICD-10-CM

## 2017-10-01 DIAGNOSIS — Z951 Presence of aortocoronary bypass graft: Secondary | ICD-10-CM | POA: Diagnosis not present

## 2017-10-01 DIAGNOSIS — I2510 Atherosclerotic heart disease of native coronary artery without angina pectoris: Secondary | ICD-10-CM | POA: Insufficient documentation

## 2017-10-01 DIAGNOSIS — M791 Myalgia, unspecified site: Secondary | ICD-10-CM | POA: Insufficient documentation

## 2017-10-01 DIAGNOSIS — E669 Obesity, unspecified: Secondary | ICD-10-CM | POA: Insufficient documentation

## 2017-10-01 DIAGNOSIS — N183 Chronic kidney disease, stage 3 (moderate): Secondary | ICD-10-CM | POA: Insufficient documentation

## 2017-10-01 DIAGNOSIS — E785 Hyperlipidemia, unspecified: Secondary | ICD-10-CM | POA: Insufficient documentation

## 2017-10-01 DIAGNOSIS — I480 Paroxysmal atrial fibrillation: Secondary | ICD-10-CM | POA: Diagnosis not present

## 2017-10-01 DIAGNOSIS — M170 Bilateral primary osteoarthritis of knee: Secondary | ICD-10-CM | POA: Insufficient documentation

## 2017-10-01 DIAGNOSIS — Z823 Family history of stroke: Secondary | ICD-10-CM | POA: Insufficient documentation

## 2017-10-01 NOTE — Patient Instructions (Signed)
You have been referred to Cardiac Rehab  Your physician recommends that you schedule a follow-up appointment in: 6 months with Dr. Aundra Dubin  (we will call you)

## 2017-10-03 NOTE — Progress Notes (Signed)
Patient ID: [name], male   DOB: 14-Jun-1946, 72 y.o.   MRN: [id number] PCP: Dr Harrington Challenger Cardiology: Dr. Aundra Dubin  72 y.o. with history of CAD s/p CABG and obesity s/p lap banding procedure presents for cardiology followup.  Given exertional dyspnea and chest tightness, he had LHC in 1/15.  This showed no change from prior.  Grafts were patent and there was severe stenosis in distal D1, not amenable to PCI.   In 2017, he developed increased exertional dyspnea.  Eventually had a TEE showing severe MR with prolapse of a portion of the anterior mitral valve leaflet.  Coronary angiography in 6/17 showed patent grafts and 90% stenosis of distal diagonal not amenable to intervention.  In 8/17, he had mitral valve repair.  Post-op diastolic CHF, Lasix started and increased.   He had a cath again in 9/18 showing stable coronary anatomy.    He developed atrial arrhythmias in 12/18.  Both fibrillation and flutter were seen.  Saw Dr. Rayann Heman, recommended DCCV with amiodarone or sotalol if fibrillation recurs.  He had DCCV in 1/19.   He returns for followup of CHF, CAD, and atrial fibrillation today.  He remains in NSR.  Has not felt any palpitations.  Feels much better in NSR.  No dyspnea walking on flat ground or up a hill.  He is going to go back to exercising again.  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  No chest pain.  No orthopnea/PND. Weight is down 12 lbs.   ECG (personally reviewed): NSR, left axis deviation  Labs (11/12): LDL 70 Labs (5/13): K 4.1, creatinine 1.1, BNP 48 Labs (12/13): K 3.4, creatinine 1.07, LDL 59, HDL 37 Labs (1/15): K 4.3, creatinine 1.2, HCT 40.3 Labs (3/15): LDL 38, HDL 81, BNP 55, K 4, creatinine 1.2 Labs (6/15): K 4.2, creatinine 1.4, LFTs normal, LDL 66, HDL 35 Labs (8/16): K 4, creatinine 1.6, BNP 64 Labs (12/16): LDL 61, HDL 37 Labs (4/17): K 4.2, creatinine 1.35, HCT 38.7, BNP 80 Labs (8/17): K 4.2, creatinine 1.4 => 1.66, BNP 159 Labs (12/17): K 3.6, creatinine  1.75 Labs (2/18): LDL 76, HDL 36 Labs (9/18): K 3.9, creatinine 1.76 Labs (1/19): K 4.9, creatinine 1.76, hgb 14  PMH: 1. OSA: on CPAP.  2. Diabetes mellitus 3. Allergic rhinitis 4. CAD: s/p CABG 1995.  LHC (10/11) with 90% distal D1 (patent proximal D1 stent), total occlusion CFX, total occlusion RCA, no significant stenoses in LAD, SVG-PDA patent, SVG-OM patent, EF 55%.  Medical management.  LHC (5/13) with patent grafts and 90% distal D1 stenosis (no significant change from 10/11).  LHC (1/15) with patent grafts, 75% ISR in D1 stent, 90% stenosis distal D1 (small caliber). D1 not amenable to intervention.  - Coronary angiography (6/17): grafts patent, 90% stenosis distally in large diagonal not amenable to intervention.   - LHC (9/18): SVG-OM and SVG-PDA patent,  75% in-stent restenosis in proximal diagonal then 90% stenosis distally (small in caliber at that point), totally occluded LCx and RCA.  Left coronary system similar to prior caths, D1 may be source of angina.  5. Obesity: lap-banding in 5/11.  6. Chronic diastolic CHF: Echo (4/09) with EF 55-60%, moderate LVH, moderate diastolic dysfunction, s/p mitral valve repair with elevated mean gradient but normal pressure half-time.  - Echo (8/18): EF 55-60%, s/p MV repair with mean gradient 6 mmHg, PASP 28 mmHg, mildly decreased RV systolic function.  7. TIA: Carotid dopplers in 11/11 with 0-39% bilateral stenosis.  Carotid dopplers 6/17  with 1-39% RICA stenosis.  8. OA: Knees, C-spine. TKR 9/13.  9. HTN 10. Hyperlipidemia: Myalgias with atorvastatin and Crestor.  11. Diverticulosis 12. MItral regurgitation: TEE (6/17) with EF 55-60%, severe eccentric MR, prolapse of a portion of the anterior leaflet, peak RV-RA gradient 45 mmHg.  - Mitral valve repair 8/17 13. CKD: Stage III.  14. Atrial fibrillation/atrial flutter: Both arrhythmias noted in 12/18.  DCCV 12/19.    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  Lives in [place].   Family History  Problem Relation Age of Onset  . Other Mother        joint problems  . Hypertension Father   . Heart disease Father   . CVA Father   . Depression Father   . Heart disease Sister        CABG  . Stroke Sister   . Hypertension Sister   . Congestive Heart Failure Sister   . Diabetes Sister   . Heart disease Brother   . Hypertension Brother   . Congestive Heart Failure Brother     ROS: All systems reviewed and negative except as per HPI.    Current Outpatient Medications  Medication Sig Dispense Refill  . amoxicillin (AMOXIL) 500 MG tablet Take 4 tablets (2 g) by mouth 30-60 minutes prior to dental appointment (Patient taking differently: Take 2,000 mg by mouth See admin instructions. 2,000 mg 30-60 minutes prior to dental or medical procedures) 12 tablet 0  . apixaban (ELIQUIS) 5 MG TABS tablet Take 1 tablet (5 mg total) by mouth 2 (two) times daily. 180 tablet 3  . Artificial Saliva (MOUTH KOTE MT) Use as directed 4 sprays in the mouth or throat every 3 (three) hours as needed (for dry mouth).    . carvedilol (COREG) 6.25 MG tablet Take 6.25 mg by mouth 2 (two) times daily with a meal.    . cetirizine (ZYRTEC) 10 MG tablet Take 10 mg by mouth daily as needed (for allergies.).     Marland Kitchen Cholecalciferol (VITAMIN D) 2000 units CAPS Take 2,000 Units by mouth daily.    . diphenhydrAMINE (BENADRYL) 25 MG tablet Take 25 mg by mouth at bedtime as needed (for sleep.).    Marland Kitchen fluticasone (FLONASE) 50 MCG/ACT nasal spray Place 1 spray into both nostrils daily as needed for allergies or rhinitis.    . furosemide (LASIX) 40 MG tablet Take 2 tablets (80 mg total) by mouth daily. 90 tablet 3  . HYDROcodone-acetaminophen (NORCO) 10-325 MG tablet Take 1 tablet by mouth every 4 (four) hours as needed for moderate pain. 20 tablet 0  . Hypromellose (ARTIFICIAL TEARS OP) Apply 1 drop to eye 2 (two) times daily as needed (dry eyes).    Marland Kitchen ibuprofen (ADVIL,MOTRIN) 200 MG tablet Take  400-800 mg by mouth every 8 (eight) hours as needed (for knee/back pain or arthritis pain.).    Marland Kitchen metFORMIN (GLUCOPHAGE) 500 MG tablet Take 500 mg by mouth 2 (two) times daily with a meal.    . nitroGLYCERIN (NITROSTAT) 0.4 MG SL tablet DISSOLVE ONE TABLET UNDER THE TONGUE EVERY 5 MINUTES AS NEEDED FOR CHEST PAIN.  DO NOT EXCEED A TOTAL OF 3 DOSES IN 15 MINUTES 25 tablet 3  . PARoxetine (PAXIL) 40 MG tablet Take 20 mg by mouth daily as needed (PTSD symptoms).    . potassium chloride SA (K-DUR,KLOR-CON) 20 MEQ tablet Take 20 mEq by mouth 2 (two) times daily.    . pravastatin (PRAVACHOL) 40 MG tablet  Take 1 tablet (40 mg total) by mouth every evening. 90 tablet 3  . ranolazine (RANEXA) 500 MG 12 hr tablet Take 1 tablet (500 mg total) by mouth 2 (two) times daily. 180 tablet 3  . tadalafil (CIALIS) 20 MG tablet Take 20 mg by mouth daily as needed for erectile dysfunction.    Marland Kitchen testosterone cypionate (DEPOTESTOSTERONE CYPIONATE) 200 MG/ML injection Inject 200 mg into the muscle once a week.    . traZODone (DESYREL) 150 MG tablet Take 150 mg by mouth at bedtime.      No current facility-administered medications for this encounter.     BP 127/80   Pulse 70   Wt 213 lb (96.6 kg)   SpO2 98%   BMI 31.45 kg/m  General: NAD Neck: No JVD, no thyromegaly or thyroid nodule.  Lungs: Clear to auscultation bilaterally with normal respiratory effort. CV: Nondisplaced PMI.  Heart regular S1/S2, no S3/S4, no murmur.  No peripheral edema.  No carotid bruit.  Normal pedal pulses.  Abdomen: Soft, nontender, no hepatosplenomegaly, no distention.  Skin: Intact without lesions or rashes.  Neurologic: Alert and oriented x 3.  Psych: Normal affect. Extremities: No clubbing or cyanosis.  HEENT: Normal.    Assessment/Plan: 1. S/p mitral valve repair:  Echo in 8/18 showed stable mitral valve repair with no MR, mean gradient 6 mmHg.   - With prosthetic material, should use antibiotic prophylaxis with dental work.   2. CAD:  He has severe stenosis in a distal D1 that is not amenable to intervention.  This has been stable on last couple of caths.  No further chest pain.  - Continue pravastatin and Coreg.   - No ASA with stable coronary disease and apixaban use.  - He does not like Imdur, feels like it fatigues him. If chest pain returns, he can use ranolazine.  - Will try to arrange for cardiac rehab for chronic stable angina.  3. Hyperlipidemia: Myalgias with Crestor and atorvastatin.  He is now on pravastatin, lipids were acceptable in 2/18. Needs repeat lipids, will arrange.  4. Chronic diastolic CHF: Volume status looks ok today.  LVH and diastolic dysfunction on last echo.  - Continue Lasix 80 mg daily, creatinine stable in 1/19.  5. OSA: Continue CPAP.  6. Atrial fibrillation: Paroxysmal, in NSR after DCCV in 1/19.  If recurs, may need amiodarone or sotalol.  - Continue apixaban 5 mg bid.  - Ranolazine will likely decrease risk of recurrence.   Followup in 6 months.   [name] 10/03/2017

## 2017-10-05 ENCOUNTER — Ambulatory Visit (HOSPITAL_COMMUNITY): Payer: Medicare Other | Admitting: Nurse Practitioner

## 2017-10-05 ENCOUNTER — Other Ambulatory Visit (HOSPITAL_COMMUNITY): Payer: Self-pay

## 2017-10-05 DIAGNOSIS — E785 Hyperlipidemia, unspecified: Secondary | ICD-10-CM

## 2017-10-05 NOTE — Progress Notes (Signed)
Has appt 10/06/17 at 845 am

## 2017-10-06 ENCOUNTER — Ambulatory Visit (HOSPITAL_COMMUNITY)
Admission: RE | Admit: 2017-10-06 | Discharge: 2017-10-06 | Disposition: A | Discharge status: Home / Self Care | Payer: Medicare Other | Source: Ambulatory Visit | Attending: Cardiology | Admitting: Cardiology

## 2017-10-06 DIAGNOSIS — E785 Hyperlipidemia, unspecified: Secondary | ICD-10-CM | POA: Insufficient documentation

## 2017-10-06 LAB — LIPID PANEL
CHOL/HDL RATIO: 3.8 ratio
CHOLESTEROL: 188 mg/dL (ref 0–200)
HDL: 49 mg/dL (ref >40)
LDL Cholesterol: 92 mg/dL (ref 0–99)
TRIGLYCERIDES: 237 mg/dL — AB (ref <150)
VLDL: 47 mg/dL — AB (ref 0–40)

## 2017-10-07 ENCOUNTER — Telehealth (HOSPITAL_COMMUNITY): Payer: Self-pay

## 2017-10-07 NOTE — Telephone Encounter (Signed)
Called to speak with patient in regards to Cardiac Rehab - Patient is interested in the program. Scheduled orientation on 11/23/2017 at 8:45am. Patient will attend the 11:15am exc class.

## 2017-10-11 ENCOUNTER — Telehealth (HOSPITAL_COMMUNITY): Payer: Self-pay

## 2017-10-11 ENCOUNTER — Encounter (HOSPITAL_COMMUNITY): Payer: Self-pay

## 2017-10-11 ENCOUNTER — Other Ambulatory Visit (HOSPITAL_COMMUNITY): Payer: Self-pay

## 2017-10-11 DIAGNOSIS — E785 Hyperlipidemia, unspecified: Secondary | ICD-10-CM

## 2017-10-11 MED ORDER — PRAVASTATIN SODIUM 80 MG PO TABS: 80.0000 mg | ORAL_TABLET | Freq: Every evening | ORAL | 3 refills | Status: DC

## 2017-10-11 NOTE — Telephone Encounter (Signed)
Pt aware of results, agreeable to med changes (changes made in [facility]), Lab appointment made (orders placed), Lipid Letter mailed to patient   Notes recorded by [name], MD on 10/08/2017 at 4:09 PM EST LDL is too high. He needs to increase pravastatin to 80 mg daily. Lipids/LFTs in 2 months

## 2017-10-19 ENCOUNTER — Other Ambulatory Visit (HOSPITAL_COMMUNITY): Payer: Self-pay | Admitting: Cardiology

## 2017-10-19 ENCOUNTER — Other Ambulatory Visit (HOSPITAL_COMMUNITY): Payer: Self-pay | Admitting: *Deleted

## 2017-10-19 MED ORDER — CARVEDILOL 6.25 MG PO TABS: 6.2500 mg | ORAL_TABLET | Freq: Two times a day (BID) | ORAL | 3 refills | Status: DC

## 2017-10-19 MED ORDER — FUROSEMIDE 40 MG PO TABS: 80.0000 mg | ORAL_TABLET | Freq: Every day | ORAL | 3 refills | Status: DC

## 2017-10-28 DIAGNOSIS — D2339 Other benign neoplasm of skin of other parts of face: Secondary | ICD-10-CM | POA: Diagnosis not present

## 2017-10-28 DIAGNOSIS — L820 Inflamed seborrheic keratosis: Secondary | ICD-10-CM | POA: Diagnosis not present

## 2017-10-28 DIAGNOSIS — D0439 Carcinoma in situ of skin of other parts of face: Secondary | ICD-10-CM | POA: Diagnosis not present

## 2017-10-28 DIAGNOSIS — D485 Neoplasm of uncertain behavior of skin: Secondary | ICD-10-CM | POA: Diagnosis not present

## 2017-10-28 DIAGNOSIS — L570 Actinic keratosis: Secondary | ICD-10-CM | POA: Diagnosis not present

## 2017-10-28 DIAGNOSIS — L821 Other seborrheic keratosis: Secondary | ICD-10-CM | POA: Diagnosis not present

## 2017-10-28 DIAGNOSIS — L814 Other melanin hyperpigmentation: Secondary | ICD-10-CM | POA: Diagnosis not present

## 2017-10-28 DIAGNOSIS — L720 Epidermal cyst: Secondary | ICD-10-CM | POA: Diagnosis not present

## 2017-10-28 DIAGNOSIS — Z85828 Personal history of other malignant neoplasm of skin: Secondary | ICD-10-CM | POA: Diagnosis not present

## 2017-11-12 DIAGNOSIS — E1122 Type 2 diabetes mellitus with diabetic chronic kidney disease: Secondary | ICD-10-CM | POA: Diagnosis not present

## 2017-11-12 DIAGNOSIS — E669 Obesity, unspecified: Secondary | ICD-10-CM | POA: Diagnosis not present

## 2017-11-12 DIAGNOSIS — I509 Heart failure, unspecified: Secondary | ICD-10-CM | POA: Diagnosis not present

## 2017-11-12 DIAGNOSIS — E785 Hyperlipidemia, unspecified: Secondary | ICD-10-CM | POA: Diagnosis not present

## 2017-11-12 DIAGNOSIS — N289 Disorder of kidney and ureter, unspecified: Secondary | ICD-10-CM | POA: Diagnosis not present

## 2017-11-12 DIAGNOSIS — I129 Hypertensive chronic kidney disease with stage 1 through stage 4 chronic kidney disease, or unspecified chronic kidney disease: Secondary | ICD-10-CM | POA: Diagnosis not present

## 2017-11-12 DIAGNOSIS — N183 Chronic kidney disease, stage 3 (moderate): Secondary | ICD-10-CM | POA: Diagnosis not present

## 2017-11-12 DIAGNOSIS — I4891 Unspecified atrial fibrillation: Secondary | ICD-10-CM | POA: Diagnosis not present

## 2017-11-12 DIAGNOSIS — N2581 Secondary hyperparathyroidism of renal origin: Secondary | ICD-10-CM | POA: Diagnosis not present

## 2017-11-12 DIAGNOSIS — I2510 Atherosclerotic heart disease of native coronary artery without angina pectoris: Secondary | ICD-10-CM | POA: Diagnosis not present

## 2017-11-12 DIAGNOSIS — Z9889 Other specified postprocedural states: Secondary | ICD-10-CM | POA: Diagnosis not present

## 2017-11-12 DIAGNOSIS — D631 Anemia in chronic kidney disease: Secondary | ICD-10-CM | POA: Diagnosis not present

## 2017-11-15 ENCOUNTER — Other Ambulatory Visit: Payer: Self-pay | Admitting: Nephrology

## 2017-11-15 DIAGNOSIS — N183 Chronic kidney disease, stage 3 unspecified: Secondary | ICD-10-CM

## 2017-11-16 ENCOUNTER — Ambulatory Visit
Admission: RE | Admit: 2017-11-16 | Discharge: 2017-11-16 | Disposition: A | Discharge status: Home / Self Care | Payer: Medicare Other | Source: Ambulatory Visit | Attending: Nephrology | Admitting: Nephrology

## 2017-11-16 DIAGNOSIS — N183 Chronic kidney disease, stage 3 unspecified: Secondary | ICD-10-CM

## 2017-11-18 ENCOUNTER — Encounter (HOSPITAL_COMMUNITY)
Admission: RE | Admit: 2017-11-18 | Discharge: 2017-11-18 | Disposition: A | Discharge status: Home / Self Care | Payer: Medicare Other | Source: Ambulatory Visit | Attending: Cardiology | Admitting: Cardiology

## 2017-11-18 ENCOUNTER — Encounter (HOSPITAL_COMMUNITY): Payer: Self-pay

## 2017-11-18 VITALS — Ht 69.0 in | Wt 211.9 lb

## 2017-11-18 DIAGNOSIS — Z79899 Other long term (current) drug therapy: Secondary | ICD-10-CM | POA: Diagnosis not present

## 2017-11-18 DIAGNOSIS — I25118 Atherosclerotic heart disease of native coronary artery with other forms of angina pectoris: Secondary | ICD-10-CM | POA: Diagnosis not present

## 2017-11-18 DIAGNOSIS — I208 Other forms of angina pectoris: Secondary | ICD-10-CM

## 2017-11-18 DIAGNOSIS — I509 Heart failure, unspecified: Secondary | ICD-10-CM | POA: Diagnosis not present

## 2017-11-18 LAB — GLUCOSE, CAPILLARY: Glucose-Capillary: 151 mg/dL — ABNORMAL HIGH (ref 65–99)

## 2017-11-18 NOTE — Progress Notes (Signed)
Cardiac Rehab Medication Review by a RN  Does the patient  feel that his/her medications are working for him/her?  yes  Has the patient been experiencing any side effects to the medications prescribed?  Yes Recently increased beta blocker and subsequent fatigue and low energy.  Does the patient measure his/her own blood pressure or blood glucose at home?  No; Pt does have a glucose meter and BP cuff but does not check his blood sugar or blood pressure.   Does the patient have any problems obtaining medications due to transportation or finances?   no  Understanding of regimen: excellent Understanding of indications: excellent Potential of compliance: excellent    [name] 11/18/2017 10:31 AM

## 2017-11-18 NOTE — Progress Notes (Signed)
[name] 72 y.o. male DOB: [birth year] MRN: [id number]      Nutrition Note  1. Stable angina [facility])    Past Medical History:  Diagnosis Date  . Allergic rhinitis   . Anxiety   . Cancer ([place])    hx of skin cancers   . Coronary artery disease    a. s/p MI & CABG; b. s/p PCI Diag;  c. Cath 12/2011: LAD diff dzs/small, Diag 75% isr, 90 dist to stent (small), LCX & RCA occluded, VG->OM 40, VG->PDA patent - med rx.  . Depression   . Diabetes mellitus    [facility]  . Diastolic CHF, chronic (HCC)    a. EF 55-60% by echo 2007  . Dysrhythmia    "abnormal beat"  . GERD (gastroesophageal reflux disease)    "not anymore" " I had a lap band"  . History of diverticulitis of colon    5 YRS AGO  . History of pneumonia    2017  . History of transient ischemic attack (TIA)    CAROTID DOPPLERS NOV 2011  0-39& BIL. STENOSIS  . Hyperlipidemia   . Hypertension   . Mitral regurgitation   . Myocardial infarction [place])   . Neuromuscular disorder ([place])    left arm numbness   . 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 PTSD (post-traumatic stress disorder)    from [place]  . 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 Shortness of breath dyspnea    with exertion  . Sleep apnea    cpap- see ov note in [facility] 05/12/11 for settings    Meds reviewed. Metformin noted  HT: Ht Readings from Last 1 Encounters:  11/18/17 5\' 9"  (1.753 m)    WT: Wt Readings from Last 3 Encounters:  11/18/17 211 lb 13.8 oz (96.1 kg)  10/01/17 213 lb (96.6 kg)  09/07/17 205 lb (93 kg)     BMI 31.2   Current tobacco use? No   Labs:  Lipid Panel     Component Value Date/Time   CHOL 188 10/06/2017 0814   CHOL 136 10/14/2016 1230   TRIG 237 (H) 10/06/2017 0814   HDL 49  10/06/2017 0814   HDL 36 (L) 10/14/2016 1230   CHOLHDL 3.8 10/06/2017 0814   VLDL 47 (H) 10/06/2017 0814   LDLCALC 92 10/06/2017 0814   LDLCALC 76 10/14/2016 1230    Lab Results  Component Value Date   HGBA1C 7.2 (H) 03/23/2016   CBG (last 3)  Recent Labs    11/18/17 0914  GLUCAP 151*    Nutrition Note Spoke with pt. Nutrition plan and goals reviewed with pt. Pt is following Step 2 of the Therapeutic Lifestyle Changes diet. Pt wants to lose wt. Per discussion, pt highest wt 373 lb before his lap band procedure ~ 9 years ago. Wt loss tips reviewed.  Pt is diabetic. Last A1c indicates blood glucose not optimally controlled. Pt does not check his CBG's at home. Pt with dx of CHF. Per discussion, pt eats out frequently. Sodium in restaurant foods discussed. Pt expressed understanding of the information reviewed. Pt aware of nutrition education classes offered and plans on attending nutrition classes.  Nutrition Diagnosis ? Food-and nutrition-related knowledge deficit related to lack of exposure to information as related  to diagnosis of: ? CVD ? DM  ? Obesity related to excessive energy intake as evidenced by a BMI of 31.2  Nutrition Intervention ? Pt's individual nutrition plan and goals reviewed with pt.  Nutrition Goal(s):  ? Pt to identify food quantities necessary to achieve weight loss of 6-24 lb (2.7-10.9 kg) at graduation from cardiac rehab. Goal wt of 170 lb desired.  ? Improved blood glucose control as evidenced by pt's A1c trending from 7.2 toward less than 7.0. ? Pt able to name foods that affect blood glucose   Plan:  Pt to attend nutrition classes ? Nutrition I ? Nutrition II ? Portion Distortion ? Diabetes Blitz ? Diabetes Q & A Will provide client-centered nutrition education as part of interdisciplinary care.   Monitor and evaluate progress toward nutrition goal with team.  Derek Mound, M.Ed, RD, LDN, CDE 11/18/2017 11:18 AM

## 2017-11-18 NOTE — Progress Notes (Signed)
Cardiac Individual Treatment Plan  Patient Details  Name: [name] MRN: [id number] Date of Birth: [birth year] Referring Provider:     CARDIAC REHAB PHASE II ORIENTATION from 11/18/2017 in [place]  Referring Provider  [name] MD      Initial Encounter Date:    CARDIAC REHAB PHASE II ORIENTATION from 11/18/2017 in [place]  Date  11/18/17  Referring Provider  [name] MD      Visit Diagnosis: Stable angina [facility])  Patient's Home Medications on Admission:  Current Outpatient Medications:  .  amoxicillin (AMOXIL) 500 MG tablet, Take 4 tablets (2 g) by mouth 30-60 minutes prior to dental appointment (Patient taking differently: Take 2,000 mg by mouth See admin instructions. 2,000 mg 30-60 minutes prior to dental or medical procedures), Disp: 12 tablet, Rfl: 0 .  apixaban (ELIQUIS) 5 MG TABS tablet, Take 1 tablet (5 mg total) by mouth 2 (two) times daily., Disp: 180 tablet, Rfl: 3 .  Artificial Saliva (MOUTH KOTE MT), Use as directed 4 sprays in the mouth or throat every 3 (three) hours as needed (for dry mouth)., Disp: , Rfl:  .  carvedilol (COREG) 6.25 MG tablet, Take 1 tablet (6.25 mg total) by mouth 2 (two) times daily with a meal., Disp: 180 tablet, Rfl: 3 .  cetirizine (ZYRTEC) 10 MG tablet, Take 10 mg by mouth daily as needed (for allergies.). , Disp: , Rfl:  .  Cholecalciferol (VITAMIN D) 2000 units CAPS, Take 2,000 Units by mouth daily., Disp: , Rfl:  .  diphenhydrAMINE (BENADRYL) 25 MG tablet, Take 25 mg by mouth at bedtime as needed (for sleep.)., Disp: , Rfl:  .  fluticasone (FLONASE) 50 MCG/ACT nasal spray, Place 1 spray into both nostrils daily as needed for allergies or rhinitis., Disp: , Rfl:  .  furosemide (LASIX) 40 MG tablet, Take 2 tablets (80 mg total) by mouth daily., Disp: 180 tablet, Rfl: 3 .  HYDROcodone-acetaminophen (NORCO) 10-325 MG tablet, Take 1 tablet by mouth every 4 (four)  hours as needed for moderate pain., Disp: 20 tablet, Rfl: 0 .  Hypromellose (ARTIFICIAL TEARS OP), Apply 1 drop to eye 2 (two) times daily as needed (dry eyes)., Disp: , Rfl:  .  metFORMIN (GLUCOPHAGE) 500 MG tablet, Take 500 mg by mouth 2 (two) times daily with a meal., Disp: , Rfl:  .  nitroGLYCERIN (NITROSTAT) 0.4 MG SL tablet, DISSOLVE ONE TABLET UNDER THE TONGUE EVERY 5 MINUTES AS NEEDED FOR CHEST PAIN.  DO NOT EXCEED A TOTAL OF 3 DOSES IN 15 MINUTES, Disp: 25 tablet, Rfl: 3 .  PARoxetine (PAXIL) 40 MG tablet, Take 20 mg by mouth daily as needed (PTSD symptoms)., Disp: , Rfl:  .  potassium chloride SA (K-DUR,KLOR-CON) 20 MEQ tablet, Take 20 mEq by mouth 2 (two) times daily., Disp: , Rfl:  .  pravastatin (PRAVACHOL) 80 MG tablet, Take 1 tablet (80 mg total) by mouth every evening., Disp: 30 tablet, Rfl: 3 .  ranolazine (RANEXA) 500 MG 12 hr tablet, Take 1 tablet (500 mg total) by mouth 2 (two) times daily., Disp: 180 tablet, Rfl: 3 .  tadalafil (CIALIS) 20 MG tablet, Take 20 mg by mouth daily as needed for erectile dysfunction., Disp: , Rfl:  .  testosterone cypionate (DEPOTESTOSTERONE CYPIONATE) 200 MG/ML injection, Inject 200 mg into the muscle once a week., Disp: , Rfl:  .  traZODone (DESYREL) 150 MG tablet, Take 150 mg by mouth at  bedtime. , Disp: , Rfl:  .  ibuprofen (ADVIL,MOTRIN) 200 MG tablet, Take 400-800 mg by mouth every 8 (eight) hours as needed (for knee/back pain or arthritis pain.)., Disp: , Rfl:   Past Medical History: Past Medical History:  Diagnosis Date  . Allergic rhinitis   . Anxiety   . Cancer ([place])    hx of skin cancers   . Coronary artery disease    a. s/p MI & CABG; b. s/p PCI Diag;  c. Cath 12/2011: LAD diff dzs/small, Diag 75% isr, 90 dist to stent (small), LCX & RCA occluded, VG->OM 40, VG->PDA patent - med rx.  . Depression   . Diabetes mellitus    ORAL MEDS  . Diastolic CHF, chronic (HCC)    a. EF 55-60% by echo 2007  . Dysrhythmia    "abnormal beat"  .  GERD (gastroesophageal reflux disease)    "not anymore" " I had a lap band"  . History of diverticulitis of colon    5 YRS AGO  . History of pneumonia    2017  . History of transient ischemic attack (TIA)    CAROTID DOPPLERS NOV 2011  0-39& BIL. STENOSIS  . Hyperlipidemia   . Hypertension   . Mitral regurgitation   . Myocardial infarction ([place])   . Neuromuscular disorder ([place])    left arm numbness   . 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 PTSD (post-traumatic stress disorder)    from [place]  . 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 Shortness of breath dyspnea    with exertion  . Sleep apnea    cpap- see ov note in [facility] 05/12/11 for settings     Tobacco Use: Social History   Tobacco Use  Smoking Status Never Smoker  Smokeless Tobacco Never Used    Labs: Recent Review Flowsheet Data    Labs for ITP Cardiac and Pulmonary Rehab Latest Ref Rng & Units 03/25/2016 03/26/2016 08/07/2016 10/14/2016 10/06/2017   Cholestrol 0 - 200 mg/dL - - 159 136 188   LDLCALC 0 - 99 mg/dL - - 60 76 92   HDL >40 mg/dL - - 30(L) 36(L) 49   Trlycerides <150 mg/dL - - 345(H) 122 237(H)   Hemoglobin A1c 4.8 - 5.6 % - - - - -   PHART 7.350 - 7.450 - - - - -   PCO2ART 35.0 - 45.0 mmHg - - - - -   HCO3 20.0 - 24.0 mEq/L - - - - -   TCO2 0 - 100 mmol/L 23 22 - - -   ACIDBASEDEF 0.0 - 2.0 mmol/L - - - - -   O2SAT % - - - - -      Capillary Blood Glucose: Lab Results  Component Value Date   GLUCAP 151 (H) 11/18/2017   GLUCAP 153 (H) 09/07/2017   GLUCAP 127 (H) 04/29/2017   GLUCAP 158 (H) 08/19/2016   GLUCAP 145 (H) 05/15/2016     Exercise Target Goals: Date: 11/18/17  Exercise Program Goal: Individual exercise prescription set using results from  initial 6 min walk test and THRR while considering  patient's activity barriers and safety.   Exercise Prescription Goal: Initial exercise prescription builds to 30-45 minutes a day of aerobic activity, 2-3 days per  week.  Home exercise guidelines will be given to patient during program as part of exercise prescription that the participant will acknowledge.  Activity Barriers & Risk Stratification: Activity Barriers & Cardiac Risk Stratification - 11/18/17 0920      Activity Barriers & Cardiac Risk Stratification   Activity Barriers  Right Hip Replacement;Other (comment);Back Problems;Deconditioning;Muscular Weakness;Arthritis    Comments  R toe discomfort    Cardiac Risk Stratification  High       6 Minute Walk: 6 Minute Walk    Row Name 11/18/17 1039         6 Minute Walk   Phase  Initial     Distance  1723 feet     Distance % Change  0 %     Distance Feet Change  0 ft     Walk Time  6 minutes     # of Rest Breaks  0     MPH  3.26     METS  3.31     RPE  13     Perceived Dyspnea   0     VO2 Peak  11.58     Symptoms  Yes (comment)     Comments  Right Toe Discomfort +5/10     Resting HR  64 bpm     Resting BP  118/62     Resting Oxygen Saturation   97 %     Exercise Oxygen Saturation  during 6 min walk  100 %     Max Ex. HR  100 bpm     Max Ex. BP  128/70     2 Minute Post BP  118/70        Oxygen Initial Assessment:   Oxygen Re-Evaluation:   Oxygen Discharge (Final Oxygen Re-Evaluation):   Initial Exercise Prescription: Initial Exercise Prescription - 11/18/17 1100      Date of Initial Exercise RX and Referring Provider   Date  11/18/17    Referring Provider  [name] MD      Treadmill   MPH  2.8    Grade  0    Minutes  10    METs  3.14      Bike   Level  1    Minutes  10    METs  2.99      NuStep   Level  4    SPM  70    Minutes  10    METs  2.6      Prescription Details   Frequency (times per week)  3x    Duration  Progress to  30 minutes of continuous aerobic without signs/symptoms of physical distress      Intensity   THRR 40-80% of Max Heartrate  59-118    Ratings of Perceived Exertion  11-15    Perceived Dyspnea  0-4      Progression   Progression  Continue progressive overload as per policy without signs/symptoms or physical distress.      Resistance Training   Training Prescription  Yes    Weight  3lbs    Reps  10-15       Perform Capillary Blood Glucose checks as needed.  Exercise Prescription Changes:   Exercise Comments:   Exercise Goals and Review: Exercise Goals    Row Name 11/18/17 0921             Exercise Goals   Increase Physical Activity  Yes       Intervention  Provide advice, education, support and counseling about physical activity/exercise needs.;Develop an individualized exercise prescription for aerobic and resistive training based on initial evaluation findings, risk stratification, comorbidities and participant's personal goals.       Expected Outcomes  Short Term: Attend rehab on a regular basis to increase amount of physical activity.;Long Term: Exercising regularly at least 3-5 days a week.;Long Term: Add in home exercise to make exercise part of routine and to increase amount of physical activity.       Increase Strength and Stamina  Yes Develop exercise routine to get back to gym. Also increase energy levels.       Intervention  Provide advice, education, support and counseling about physical activity/exercise needs.;Develop an individualized exercise prescription for aerobic and resistive training based on initial evaluation findings, risk stratification, comorbidities and participant's personal goals.       Expected Outcomes  Short Term: Increase workloads from initial exercise prescription for resistance, speed, and METs.;Short Term: Perform resistance training exercises routinely during rehab and add in resistance training at home;Long Term: Improve cardiorespiratory  fitness, muscular endurance and strength as measured by increased METs and functional capacity (6MWT)       Able to understand and use rate of perceived exertion (RPE) scale  Yes       Intervention  Provide education and explanation on how to use RPE scale       Expected Outcomes  Short Term: Able to use RPE daily in rehab to express subjective intensity level;Long Term:  Able to use RPE to guide intensity level when exercising independently       Knowledge and understanding of Target Heart Rate Range (THRR)  Yes       Intervention  Provide education and explanation of THRR including how the numbers were predicted and where they are located for reference       Expected Outcomes  Short Term: Able to state/look up THRR;Long Term: Able to use THRR to govern intensity when exercising independently       Able to check pulse independently  Yes       Intervention  Provide education and demonstration on how to check pulse in carotid and radial arteries.;Review the importance of being able to check your own pulse for safety during independent exercise       Expected Outcomes  Short Term: Able to explain why pulse checking is important during independent exercise;Long Term: Able to check pulse independently and accurately       Understanding of Exercise Prescription  Yes       Intervention  Provide education, explanation, and written materials on patient's individual exercise prescription       Expected Outcomes  Short Term: Able to explain program exercise prescription;Long Term: Able to explain home exercise prescription to exercise independently          Exercise Goals Re-Evaluation :    Discharge Exercise Prescription (Final Exercise Prescription Changes):   Nutrition:  Target Goals: Understanding of nutrition guidelines, daily intake of sodium 1500mg , cholesterol 200mg , calories 30% from fat and 7% or less from saturated fats, daily to have 5 or more servings of fruits and  vegetables.  Biometrics: Pre Biometrics - 11/18/17 1046      Pre Biometrics   Height  5\' 9"  (1.753 m)    Weight  211 lb 13.8 oz (96.1 kg)    Waist Circumference  43.5 inches    Hip Circumference  42 inches    Waist to Hip Ratio  1.04 %    BMI (Calculated)  31.27    Triceps Skinfold  10 mm    % Body Fat  28.9 %    Grip Strength  46 kg    Flexibility  9.5 in    Single Leg Stand  5 seconds        Nutrition Therapy Plan and Nutrition Goals: Nutrition Therapy & Goals - 11/18/17 1123      Nutrition Therapy   Diet  Carb Modified, Therapeutic Lifestyle Changes      Personal Nutrition Goals   Nutrition Goal  1-2 lb wt loss/week to a wt loss goal of 6-24 lb at graduation from Cardiac Rehab    Personal Goal #2  Improved glycemic control as evidenced by an A1c trending from 7.2 toward less than 7      Intervention Plan   Intervention  Prescribe, educate and counsel regarding individualized specific dietary modifications aiming towards targeted core components such as weight, hypertension, lipid management, diabetes, heart failure and other comorbidities.    Expected Outcomes  Short Term Goal: Understand basic principles of dietary content, such as calories, fat, sodium, cholesterol and nutrients.;Long Term Goal: Adherence to prescribed nutrition plan.       Nutrition Assessments: Nutrition Assessments - 11/18/17 1124      MEDFICTS Scores   Pre Score  17       Nutrition Goals Re-Evaluation:   Nutrition Goals Re-Evaluation:   Nutrition Goals Discharge (Final Nutrition Goals Re-Evaluation):   Psychosocial: Target Goals: Acknowledge presence or absence of significant depression and/or stress, maximize coping skills, provide positive support system. Participant is able to verbalize types and ability to use techniques and skills needed for reducing stress and depression.  Initial Review & Psychosocial Screening: Initial Psych Review & Screening - 11/18/17 1150      Initial  Review   Current issues with  None Identified      Family Dynamics   Good Support System?  Yes Pt reports that he has supportive wife, family, and friends.       Barriers   Psychosocial barriers to participate in program  There are no identifiable barriers or psychosocial needs.      Screening Interventions   Interventions  Encouraged to exercise    Expected Outcomes  Short Term goal: Identification and review with participant of any Quality of Life or Depression concerns found by scoring the questionnaire.;Long Term goal: The participant improves quality of Life and PHQ9 Scores as seen by post scores and/or verbalization of changes       Quality of Life Scores: Quality of Life - 11/18/17 1047      Quality of Life Scores   Health/Function Pre  20.23 %    Socioeconomic Pre  29.25 %    Psych/Spiritual Pre  30 %    Family Pre  19.2 %    GLOBAL Pre  24.1 %      Scores of 19 and below usually indicate a poorer quality of life in these areas.  A difference of  2-3 points is a clinically meaningful difference.  A difference of 2-3 points in the total score of the Quality of Life Index has been associated with significant improvement in overall quality of life, self-image, physical symptoms, and general health in studies assessing change in quality of life.  PHQ-9: Recent Review Flowsheet Data    Depression screen [facility] 2/9 08/21/2016 05/11/2016 02/14/2014   Decreased Interest 0 0 0   Down, Depressed, Hopeless 0 0  0   PHQ - 2 Score 0 0 0     Interpretation of Total Score  Total Score Depression Severity:  1-4 = Minimal depression, 5-9 = Mild depression, 10-14 = Moderate depression, 15-19 = Moderately severe depression, 20-27 = Severe depression   Psychosocial Evaluation and Intervention:   Psychosocial Re-Evaluation:   Psychosocial Discharge (Final Psychosocial Re-Evaluation):   Vocational Rehabilitation: Provide vocational rehab assistance to qualifying candidates.    Vocational Rehab Evaluation & Intervention: Vocational Rehab - 11/18/17 1147      Initial Vocational Rehab Evaluation & Intervention   Assessment shows need for Vocational Rehabilitation  No       Education: Education Goals: Education classes will be provided on a weekly basis, covering required topics. Participant will state understanding/return demonstration of topics presented.  Learning Barriers/Preferences: Learning Barriers/Preferences - 11/18/17 0938      Learning Barriers/Preferences   Learning Barriers  None    Learning Preferences  Skilled Demonstration       Education Topics: Count Your Pulse:  -Group instruction provided by verbal instruction, demonstration, patient participation and written materials to support subject.  Instructors address importance of being able to find your pulse and how to count your pulse when at home without a heart monitor.  Patients get hands on experience counting their pulse with staff help and individually.   Heart Attack, Angina, and Risk Factor Modification:  -Group instruction provided by verbal instruction, video, and written materials to support subject.  Instructors address signs and symptoms of angina and heart attacks.    Also discuss risk factors for heart disease and how to make changes to improve heart health risk factors.   Functional Fitness:  -Group instruction provided by verbal instruction, demonstration, patient participation, and written materials to support subject.  Instructors address safety measures for doing things around the house.  Discuss how to get up and down off the floor, how to pick things up properly, how to safely get out of a chair without assistance, and balance training.   Meditation and Mindfulness:  -Group instruction provided by verbal instruction, patient participation, and written materials to support subject.  Instructor addresses importance of mindfulness and meditation practice to help reduce  stress and improve awareness.  Instructor also leads participants through a meditation exercise.    Stretching for Flexibility and Mobility:  -Group instruction provided by verbal instruction, patient participation, and written materials to support subject.  Instructors lead participants through series of stretches that are designed to increase flexibility thus improving mobility.  These stretches are additional exercise for major muscle groups that are typically performed during regular warm up and cool down.   Hands Only CPR:  -Group verbal, video, and participation provides a basic overview of AHA guidelines for community CPR. Role-play of emergencies allow participants the opportunity to practice calling for help and chest compression technique with discussion of AED use.   Hypertension: -Group verbal and written instruction that provides a basic overview of hypertension including the most recent diagnostic guidelines, risk factor reduction with self-care instructions and medication management.    Nutrition I class: Heart Healthy Eating:  -Group instruction provided by PowerPoint slides, verbal discussion, and written materials to support subject matter. The instructor gives an explanation and review of the Therapeutic Lifestyle Changes diet recommendations, which includes a discussion on lipid goals, dietary fat, sodium, fiber, plant stanol/sterol esters, sugar, and the components of a well-balanced, healthy diet.   CARDIAC REHAB PHASE II EXERCISE from 07/10/2016 in [place]  [place]  Date  05/22/16  Educator  RD  Instruction Review Code (Retired)  Not applicable [employer] handouts given]      Nutrition II class: Lifestyle Skills:  -Group instruction provided by [employer], verbal discussion, and written materials to support subject matter. The instructor gives an explanation and review of label reading, grocery shopping for heart health, heart healthy  recipe modifications, and ways to make healthier choices when eating out.   CARDIAC REHAB PHASE II EXERCISE from 07/10/2016 in [place]  Date  05/22/16  Educator  RD  Instruction Review Code (Retired)  Not applicable [class handouts given]      Diabetes Question & Answer:  -Group instruction provided by PowerPoint slides, verbal discussion, and written materials to support subject matter. The instructor gives an explanation and review of diabetes co-morbidities, pre- and post-prandial blood glucose goals, pre-exercise blood glucose goals, signs, symptoms, and treatment of hypoglycemia and hyperglycemia, and foot care basics.   Diabetes Blitz:  -Group instruction provided by PowerPoint slides, verbal discussion, and written materials to support subject matter. The instructor gives an explanation and review of the physiology behind type 1 and type 2 diabetes, diabetes medications and rational behind using different medications, pre- and post-prandial blood glucose recommendations and Hemoglobin A1c goals, diabetes diet, and exercise including blood glucose guidelines for exercising safely.    CARDIAC REHAB PHASE II EXERCISE from 07/10/2016 in [place]  Date  05/22/16  Educator  RD  Instruction Review Code (Retired)  Not applicable [employer] handouts given]      Portion Distortion:  -Group instruction provided by [employer], verbal discussion, written materials, and food models to support subject matter. The instructor gives an explanation of serving size versus portion size, changes in portions sizes over the last 20 years, and what consists of a serving from each food group.   Stress Management:  -Group instruction provided by verbal instruction, video, and written materials to support subject matter.  Instructors review role of stress in heart disease and how to cope with stress positively.     CARDIAC REHAB PHASE II  EXERCISE from 07/10/2016 in [place]  Date  07/08/16  Instruction Review Code (Retired)  2- meets goals/outcomes      Exercising on Your Own:  -Group instruction provided by verbal instruction, power point, and written materials to support subject.  Instructors discuss benefits of exercise, components of exercise, frequency and intensity of exercise, and end points for exercise.  Also discuss use of nitroglycerin and activating EMS.  Review options of places to exercise outside of rehab.  Review guidelines for sex with heart disease.   CARDIAC REHAB PHASE II EXERCISE from 07/10/2016 in [place]  Date  07/10/16  Instruction Review Code (Retired)  2- meets goals/outcomes      Cardiac Drugs I:  -Group instruction provided by verbal instruction and written materials to support subject.  Instructor reviews cardiac drug classes: antiplatelets, anticoagulants, beta blockers, and statins.  Instructor discusses reasons, side effects, and lifestyle considerations for each drug class.   Cardiac Drugs II:  -Group instruction provided by verbal instruction and written materials to support subject.  Instructor reviews cardiac drug classes: angiotensin converting enzyme inhibitors (ACE-I), angiotensin II receptor blockers (ARBs), nitrates, and calcium channel blockers.  Instructor discusses reasons, side effects, and lifestyle considerations for each drug class.   Anatomy and Physiology of the Circulatory System:  Group verbal and written instruction and models provide basic cardiac anatomy and physiology, with the coronary electrical and arterial systems. Review of: AMI, Angina, Valve disease, Heart Failure, Peripheral Artery Disease, Cardiac Arrhythmia, Pacemakers, and the ICD.   Other Education:  -Group or individual verbal, written, or video instructions that support the educational goals of the cardiac rehab program.   Holiday  Eating Survival Tips:  -Group instruction provided by PowerPoint slides, verbal discussion, and written materials to support subject matter. The instructor gives patients tips, tricks, and techniques to help them not only survive but enjoy the holidays despite the onslaught of food that accompanies the holidays.   Knowledge Questionnaire Score: Knowledge Questionnaire Score - 11/18/17 1047      Knowledge Questionnaire Score   Pre Score  22/24       Core Components/Risk Factors/Patient Goals at Admission: Personal Goals and Risk Factors at Admission - 11/18/17 1038      Core Components/Risk Factors/Patient Goals on Admission    Weight Management  Yes;Weight Loss    Intervention  Weight Management: Develop a combined nutrition and exercise program designed to reach desired caloric intake, while maintaining appropriate intake of nutrient and fiber, sodium and fats, and appropriate energy expenditure required for the weight goal.;Weight Management: Provide education and appropriate resources to help participant work on and attain dietary goals.;Weight Management/Obesity: Establish reasonable short term and long term weight goals.;Obesity: Provide education and appropriate resources to help participant work on and attain dietary goals.    Admit Weight  211 lb (95.7 kg)    Goal Weight: Short Term  208 lb (94.3 kg)    Goal Weight: Long Term  199 lb (90.3 kg)    Expected Outcomes  Short Term: Continue to assess and modify interventions until short term weight is achieved;Long Term: Adherence to nutrition and physical activity/exercise program aimed toward attainment of established weight goal;Weight Loss: Understanding of general recommendations for a balanced deficit meal plan, which promotes 1-2 lb weight loss per week and includes a negative energy balance of [phone] kcal/d;Understanding recommendations for meals to include 15-35% energy as protein, 25-35% energy from fat, 35-60% energy from  carbohydrates, less than 200mg  of dietary cholesterol, 20-35 gm of total fiber daily;Understanding of distribution of calorie intake throughout the day with the consumption of 4-5 meals/snacks    Diabetes  Yes    Intervention  Provide education about signs/symptoms and action to take for hypo/hyperglycemia.;Provide education about proper nutrition, including hydration, and aerobic/resistive exercise prescription along with prescribed medications to achieve blood glucose in normal ranges: Fasting glucose 65-99 mg/dL    Expected Outcomes  Short Term: Participant verbalizes understanding of the signs/symptoms and immediate care of hyper/hypoglycemia, proper foot care and importance of medication, aerobic/resistive exercise and nutrition plan for blood glucose control.;Long Term: Attainment of HbA1C < 7%.    Hypertension  Yes    Intervention  Provide education on lifestyle modifcations including regular physical activity/exercise, weight management, moderate sodium restriction and increased consumption of fresh fruit, vegetables, and low fat dairy, alcohol moderation, and smoking cessation.;Monitor prescription use compliance.    Expected Outcomes  Short Term: Continued assessment and intervention until BP is < 140/87mm HG in hypertensive participants. < 130/25mm HG in hypertensive participants with diabetes, heart failure or chronic kidney disease.;Long Term: Maintenance of blood pressure at goal levels.    Lipids  Yes    Intervention  Provide education and support for participant on nutrition & aerobic/resistive exercise along with prescribed medications to achieve LDL 70mg ,  HDL >40mg .    Expected Outcomes  Short Term: Participant states understanding of desired cholesterol values and is compliant with medications prescribed. Participant is following exercise prescription and nutrition guidelines.;Long Term: Cholesterol controlled with medications as prescribed, with individualized exercise RX and with  personalized nutrition plan. Value goals: LDL < 70mg , HDL > 40 mg.       Core Components/Risk Factors/Patient Goals Review:    Core Components/Risk Factors/Patient Goals at Discharge (Final Review):    ITP Comments: ITP Comments    Row Name 11/18/17 0919           ITP Comments  Dr. Fransico Him, Medical Director          Comments: Patient attended orientation from [phone] to 1020 to review rules and guidelines for program. Completed 6 minute walk test, Intitial ITP, and exercise prescription.  VSS. Telemetry-SR.  Asymptomatic. Tolerated light exercise well. No psychosocial needs identified. No interventions necessary at this time.   Noel Christmas

## 2017-11-22 ENCOUNTER — Encounter (HOSPITAL_COMMUNITY)
Admission: RE | Admit: 2017-11-22 | Discharge: 2017-11-22 | Disposition: A | Discharge status: Home / Self Care | Payer: Medicare Other | Source: Ambulatory Visit | Attending: Cardiology | Admitting: Cardiology

## 2017-11-22 DIAGNOSIS — I25118 Atherosclerotic heart disease of native coronary artery with other forms of angina pectoris: Secondary | ICD-10-CM | POA: Diagnosis not present

## 2017-11-22 DIAGNOSIS — I208 Other forms of angina pectoris: Secondary | ICD-10-CM | POA: Diagnosis present

## 2017-11-22 DIAGNOSIS — Z79899 Other long term (current) drug therapy: Secondary | ICD-10-CM | POA: Insufficient documentation

## 2017-11-22 DIAGNOSIS — I509 Heart failure, unspecified: Secondary | ICD-10-CM | POA: Insufficient documentation

## 2017-11-22 LAB — GLUCOSE, CAPILLARY
GLUCOSE-CAPILLARY: 135 mg/dL — AB (ref 65–99)
GLUCOSE-CAPILLARY: 168 mg/dL — AB (ref 65–99)

## 2017-11-22 NOTE — Progress Notes (Signed)
Daily Session Note  Patient Details  Name: [name] MRN: [id number] Date of Birth: 1946/04/01 Referring Provider:     CARDIAC REHAB PHASE II ORIENTATION from 11/18/2017 in [place]  Referring Provider  Jason Dresser MD      Encounter Date: 11/22/2017  Check In: Session Check In - 11/22/17 1225      Check-In   Location  [facility]    Staff Present  Jason Nutting Fair, MS, ACSM RCEP, Exercise Physiologist;Jason Nevels, MS,ACSM CEP, Exercise Physiologist;Other;Jason Celesta Aver, MS, ACSM CEP, Exercise Physiologist;Jason Whitaker, RN, BSN    Supervising physician immediately available to respond to emergencies  [facility] immediately available    Physician(s)  Dr. Posey Pronto    Medication changes reported      No    Fall or balance concerns reported     No    Tobacco Cessation  No Change    Warm-up and Cool-down  Performed as group-led instruction    Resistance Training Performed  Yes    VAD Patient?  No      Pain Assessment   Currently in Pain?  No/denies    Multiple Pain Sites  No       Capillary Blood Glucose: Results for orders placed or performed during the hospital encounter of 11/22/17 (from the past 24 hour(s))  Glucose, capillary     Status: Abnormal   Collection Time: 11/22/17 11:28 AM  Result Value Ref Range   Glucose-Capillary 135 (H) 65 - 99 mg/dL  Glucose, capillary     Status: Abnormal   Collection Time: 11/22/17 12:13 PM  Result Value Ref Range   Glucose-Capillary 168 (H) 65 - 99 mg/dL      Social History   Tobacco Use  Smoking Status Never Smoker  Smokeless Tobacco Never Used    Goals Met:  Exercise tolerated well  Goals Unmet:  Not Applicable  Comments: [name] started cardiac rehab today.  Pt tolerated light exercise without difficulty. VSS, telemetry-Sinus Rhythm with arrythmia, asymptomatic.  Medication list reconciled. Pt denies barriers to medicaiton compliance.  PSYCHOSOCIAL ASSESSMENT:   PHQ-0. Pt exhibits positive coping skills, hopeful outlook with supportive family. No psychosocial needs identified at this time, no psychosocial interventions necessary.    Pt enjoys dancing and going to the lake.   Pt oriented to exercise equipment and routine.    Understanding verbalized.Jason Pall, RN,BSN 11/22/2017 12:38 PM   Dr. Fransico Him is Medical Director for Cardiac Rehab at [facility].

## 2017-11-23 ENCOUNTER — Ambulatory Visit (HOSPITAL_COMMUNITY): Payer: Medicare Other

## 2017-11-24 ENCOUNTER — Encounter (HOSPITAL_COMMUNITY)
Admission: RE | Admit: 2017-11-24 | Discharge: 2017-11-24 | Disposition: A | Discharge status: Home / Self Care | Payer: Medicare Other | Source: Ambulatory Visit | Attending: Cardiology | Admitting: Cardiology

## 2017-11-24 DIAGNOSIS — Z9889 Other specified postprocedural states: Secondary | ICD-10-CM

## 2017-11-24 DIAGNOSIS — I208 Other forms of angina pectoris: Secondary | ICD-10-CM

## 2017-11-24 DIAGNOSIS — I509 Heart failure, unspecified: Secondary | ICD-10-CM | POA: Diagnosis not present

## 2017-11-24 DIAGNOSIS — Z79899 Other long term (current) drug therapy: Secondary | ICD-10-CM | POA: Diagnosis not present

## 2017-11-24 DIAGNOSIS — I25118 Atherosclerotic heart disease of native coronary artery with other forms of angina pectoris: Secondary | ICD-10-CM | POA: Diagnosis not present

## 2017-11-24 LAB — GLUCOSE, CAPILLARY
GLUCOSE-CAPILLARY: 144 mg/dL — AB (ref 65–99)
GLUCOSE-CAPILLARY: 157 mg/dL — AB (ref 65–99)

## 2017-11-25 DIAGNOSIS — R002 Palpitations: Secondary | ICD-10-CM | POA: Diagnosis not present

## 2017-11-25 DIAGNOSIS — J329 Chronic sinusitis, unspecified: Secondary | ICD-10-CM | POA: Diagnosis not present

## 2017-11-26 ENCOUNTER — Encounter (HOSPITAL_COMMUNITY): Payer: Medicare Other

## 2017-11-26 DIAGNOSIS — D0439 Carcinoma in situ of skin of other parts of face: Secondary | ICD-10-CM | POA: Diagnosis not present

## 2017-11-26 DIAGNOSIS — L570 Actinic keratosis: Secondary | ICD-10-CM | POA: Diagnosis not present

## 2017-11-26 DIAGNOSIS — Z85828 Personal history of other malignant neoplasm of skin: Secondary | ICD-10-CM | POA: Diagnosis not present

## 2017-11-29 ENCOUNTER — Ambulatory Visit (HOSPITAL_COMMUNITY): Payer: Medicare Other

## 2017-11-29 ENCOUNTER — Encounter (HOSPITAL_COMMUNITY)
Admission: RE | Admit: 2017-11-29 | Discharge: 2017-11-29 | Disposition: A | Discharge status: Home / Self Care | Payer: Medicare Other | Source: Ambulatory Visit | Attending: Cardiology | Admitting: Cardiology

## 2017-11-29 DIAGNOSIS — I509 Heart failure, unspecified: Secondary | ICD-10-CM | POA: Diagnosis not present

## 2017-11-29 DIAGNOSIS — Z9889 Other specified postprocedural states: Secondary | ICD-10-CM

## 2017-11-29 DIAGNOSIS — I208 Other forms of angina pectoris: Secondary | ICD-10-CM

## 2017-11-29 DIAGNOSIS — I25118 Atherosclerotic heart disease of native coronary artery with other forms of angina pectoris: Secondary | ICD-10-CM | POA: Diagnosis not present

## 2017-11-29 DIAGNOSIS — Z79899 Other long term (current) drug therapy: Secondary | ICD-10-CM | POA: Diagnosis not present

## 2017-12-01 ENCOUNTER — Encounter (HOSPITAL_COMMUNITY)
Admission: RE | Admit: 2017-12-01 | Discharge: 2017-12-01 | Disposition: A | Discharge status: Home / Self Care | Payer: Medicare Other | Source: Ambulatory Visit | Attending: Cardiology | Admitting: Cardiology

## 2017-12-01 ENCOUNTER — Ambulatory Visit (HOSPITAL_COMMUNITY): Payer: Medicare Other

## 2017-12-01 DIAGNOSIS — Z79899 Other long term (current) drug therapy: Secondary | ICD-10-CM | POA: Diagnosis not present

## 2017-12-01 DIAGNOSIS — I25118 Atherosclerotic heart disease of native coronary artery with other forms of angina pectoris: Secondary | ICD-10-CM | POA: Diagnosis not present

## 2017-12-01 DIAGNOSIS — I208 Other forms of angina pectoris: Secondary | ICD-10-CM

## 2017-12-01 DIAGNOSIS — I509 Heart failure, unspecified: Secondary | ICD-10-CM | POA: Diagnosis not present

## 2017-12-03 ENCOUNTER — Ambulatory Visit (HOSPITAL_COMMUNITY): Payer: Medicare Other

## 2017-12-03 ENCOUNTER — Encounter (HOSPITAL_COMMUNITY)
Admission: RE | Admit: 2017-12-03 | Discharge: 2017-12-03 | Disposition: A | Discharge status: Home / Self Care | Payer: Medicare Other | Source: Ambulatory Visit | Attending: Cardiology | Admitting: Cardiology

## 2017-12-03 DIAGNOSIS — I25118 Atherosclerotic heart disease of native coronary artery with other forms of angina pectoris: Secondary | ICD-10-CM | POA: Diagnosis not present

## 2017-12-03 DIAGNOSIS — Z9889 Other specified postprocedural states: Secondary | ICD-10-CM

## 2017-12-03 DIAGNOSIS — I208 Other forms of angina pectoris: Secondary | ICD-10-CM

## 2017-12-03 DIAGNOSIS — I509 Heart failure, unspecified: Secondary | ICD-10-CM | POA: Diagnosis not present

## 2017-12-03 DIAGNOSIS — Z79899 Other long term (current) drug therapy: Secondary | ICD-10-CM | POA: Diagnosis not present

## 2017-12-06 ENCOUNTER — Encounter (HOSPITAL_COMMUNITY)
Admission: RE | Admit: 2017-12-06 | Discharge: 2017-12-06 | Disposition: A | Discharge status: Home / Self Care | Payer: Medicare Other | Source: Ambulatory Visit | Attending: Cardiology | Admitting: Cardiology

## 2017-12-06 ENCOUNTER — Ambulatory Visit (HOSPITAL_COMMUNITY): Payer: Medicare Other

## 2017-12-06 DIAGNOSIS — Z79899 Other long term (current) drug therapy: Secondary | ICD-10-CM | POA: Diagnosis not present

## 2017-12-06 DIAGNOSIS — Z9889 Other specified postprocedural states: Secondary | ICD-10-CM

## 2017-12-06 DIAGNOSIS — I25118 Atherosclerotic heart disease of native coronary artery with other forms of angina pectoris: Secondary | ICD-10-CM | POA: Diagnosis not present

## 2017-12-06 DIAGNOSIS — I509 Heart failure, unspecified: Secondary | ICD-10-CM | POA: Diagnosis not present

## 2017-12-06 DIAGNOSIS — I208 Other forms of angina pectoris: Secondary | ICD-10-CM

## 2017-12-08 ENCOUNTER — Encounter (HOSPITAL_COMMUNITY): Payer: Self-pay

## 2017-12-08 ENCOUNTER — Encounter (HOSPITAL_COMMUNITY)
Admission: RE | Admit: 2017-12-08 | Discharge: 2017-12-08 | Disposition: A | Discharge status: Home / Self Care | Payer: Medicare Other | Source: Ambulatory Visit | Attending: Cardiology | Admitting: Cardiology

## 2017-12-08 ENCOUNTER — Ambulatory Visit (HOSPITAL_COMMUNITY): Payer: Medicare Other

## 2017-12-08 ENCOUNTER — Encounter (HOSPITAL_COMMUNITY): Payer: Medicare Other

## 2017-12-08 DIAGNOSIS — I208 Other forms of angina pectoris: Secondary | ICD-10-CM

## 2017-12-08 DIAGNOSIS — I25118 Atherosclerotic heart disease of native coronary artery with other forms of angina pectoris: Secondary | ICD-10-CM | POA: Diagnosis not present

## 2017-12-08 DIAGNOSIS — Z79899 Other long term (current) drug therapy: Secondary | ICD-10-CM | POA: Diagnosis not present

## 2017-12-08 DIAGNOSIS — I509 Heart failure, unspecified: Secondary | ICD-10-CM | POA: Diagnosis not present

## 2017-12-09 ENCOUNTER — Ambulatory Visit (HOSPITAL_COMMUNITY)
Admission: RE | Admit: 2017-12-09 | Discharge: 2017-12-09 | Disposition: A | Discharge status: Home / Self Care | Payer: Medicare Other | Source: Ambulatory Visit | Attending: Cardiology | Admitting: Cardiology

## 2017-12-09 DIAGNOSIS — E785 Hyperlipidemia, unspecified: Secondary | ICD-10-CM | POA: Insufficient documentation

## 2017-12-09 LAB — LIPID PANEL
CHOL/HDL RATIO: 4 ratio
CHOLESTEROL: 151 mg/dL (ref 0–200)
HDL: 38 mg/dL — ABNORMAL LOW (ref >40)
LDL CALC: 75 mg/dL (ref 0–99)
TRIGLYCERIDES: 188 mg/dL — AB (ref <150)
VLDL: 38 mg/dL (ref 0–40)

## 2017-12-09 LAB — HEPATIC FUNCTION PANEL
ALBUMIN: 4.1 g/dL (ref 3.5–5.0)
ALK PHOS: 54 U/L (ref 38–126)
ALT: 12 U/L — ABNORMAL LOW (ref 17–63)
AST: 14 U/L — AB (ref 15–41)
Bilirubin, Direct: 0.1 mg/dL (ref 0.1–0.5)
Indirect Bilirubin: 0.8 mg/dL (ref 0.3–0.9)
TOTAL PROTEIN: 6.7 g/dL (ref 6.5–8.1)
Total Bilirubin: 0.9 mg/dL (ref 0.3–1.2)

## 2017-12-09 NOTE — Progress Notes (Signed)
I have reviewed a Home Exercise Prescription with [name] . [name] is currently exercising at home.  The patient was advised to continue to exercise 2-3 days a week for 30-45 minutes at [employer]. Pt walks 15-20 minutes on Treadmill, 15-20 minutes on Bike and resistance training.  [name] and I discussed how to progress their exercise prescription. The patient stated that they understand the exercise prescription.  We reviewed exercise guidelines, target heart rate during exercise, weather, NTG use endpoints for exercise, warmup and cool down.Patient is encouraged to come to me with any questions. I will continue to follow up with the patient to assist them with progression and safety.    Carma Lair MS, ACSM CEP 8:15 AM 12/09/2017

## 2017-12-09 NOTE — Progress Notes (Addendum)
Cardiac Individual Treatment Plan  Patient Details  Name: [name] MRN: [id number] Date of Birth: 05-Apr-1946 Referring Provider:   Flowsheet Row CARDIAC REHAB PHASE II ORIENTATION from 11/18/2017 in [place]  Referring Provider  [name] MD      Initial Encounter Date:  [place] PHASE II ORIENTATION from 11/18/2017 in [place]  Date  11/18/17  Referring Provider  [name] MD      Visit Diagnosis: Stable angina [facility])  Patient's Home Medications on Admission:  Current Outpatient Medications:  .  amoxicillin (AMOXIL) 500 MG tablet, Take 4 tablets (2 g) by mouth 30-60 minutes prior to dental appointment (Patient taking differently: Take 2,000 mg by mouth See admin instructions. 2,000 mg 30-60 minutes prior to dental or medical procedures), Disp: 12 tablet, Rfl: 0 .  apixaban (ELIQUIS) 5 MG TABS tablet, Take 1 tablet (5 mg total) by mouth 2 (two) times daily., Disp: 180 tablet, Rfl: 3 .  Artificial Saliva (MOUTH KOTE MT), Use as directed 4 sprays in the mouth or throat every 3 (three) hours as needed (for dry mouth)., Disp: , Rfl:  .  carvedilol (COREG) 6.25 MG tablet, Take 1 tablet (6.25 mg total) by mouth 2 (two) times daily with a meal., Disp: 180 tablet, Rfl: 3 .  cetirizine (ZYRTEC) 10 MG tablet, Take 10 mg by mouth daily as needed (for allergies.). , Disp: , Rfl:  .  Cholecalciferol (VITAMIN D) 2000 units CAPS, Take 2,000 Units by mouth daily., Disp: , Rfl:  .  diphenhydrAMINE (BENADRYL) 25 MG tablet, Take 25 mg by mouth at bedtime as needed (for sleep.)., Disp: , Rfl:  .  fluticasone (FLONASE) 50 MCG/ACT nasal spray, Place 1 spray into both nostrils daily as needed for allergies or rhinitis., Disp: , Rfl:  .  furosemide (LASIX) 40 MG tablet, Take 2 tablets (80 mg total) by mouth daily., Disp: 180 tablet, Rfl: 3 .  HYDROcodone-acetaminophen (NORCO) 10-325 MG tablet, Take 1 tablet  by mouth every 4 (four) hours as needed for moderate pain., Disp: 20 tablet, Rfl: 0 .  Hypromellose (ARTIFICIAL TEARS OP), Apply 1 drop to eye 2 (two) times daily as needed (dry eyes)., Disp: , Rfl:  .  ibuprofen (ADVIL,MOTRIN) 200 MG tablet, Take 400-800 mg by mouth every 8 (eight) hours as needed (for knee/back pain or arthritis pain.)., Disp: , Rfl:  .  metFORMIN (GLUCOPHAGE) 500 MG tablet, Take 500 mg by mouth 2 (two) times daily with a meal., Disp: , Rfl:  .  nitroGLYCERIN (NITROSTAT) 0.4 MG SL tablet, DISSOLVE ONE TABLET UNDER THE TONGUE EVERY 5 MINUTES AS NEEDED FOR CHEST PAIN.  DO NOT EXCEED A TOTAL OF 3 DOSES IN 15 MINUTES, Disp: 25 tablet, Rfl: 3 .  PARoxetine (PAXIL) 40 MG tablet, Take 20 mg by mouth daily as needed (PTSD symptoms)., Disp: , Rfl:  .  potassium chloride SA (K-DUR,KLOR-CON) 20 MEQ tablet, Take 20 mEq by mouth 2 (two) times daily., Disp: , Rfl:  .  pravastatin (PRAVACHOL) 80 MG tablet, Take 1 tablet (80 mg total) by mouth every evening., Disp: 30 tablet, Rfl: 3 .  ranolazine (RANEXA) 500 MG 12 hr tablet, Take 1 tablet (500 mg total) by mouth 2 (two) times daily., Disp: 180 tablet, Rfl: 3 .  tadalafil (CIALIS) 20 MG tablet, Take 20 mg by mouth daily as needed for erectile dysfunction., Disp: , Rfl:  .  testosterone cypionate (DEPOTESTOSTERONE CYPIONATE) 200  MG/ML injection, Inject 200 mg into the muscle once a week., Disp: , Rfl:  .  traZODone (DESYREL) 150 MG tablet, Take 150 mg by mouth at bedtime. , Disp: , Rfl:   Past Medical History: Past Medical History:  Diagnosis Date  . Allergic rhinitis   . Anxiety   . Cancer ([place])    hx of skin cancers   . Coronary artery disease    a. s/p MI & CABG; b. s/p PCI Diag;  c. Cath 12/2011: LAD diff dzs/small, Diag 75% isr, 90 dist to stent (small), LCX & RCA occluded, VG->OM 40, VG->PDA patent - med rx.  . Depression   . Diabetes mellitus    ORAL MEDS  . Diastolic CHF, chronic (HCC)    a. EF 55-60% by echo 2007  . Dysrhythmia     "abnormal beat"  . GERD (gastroesophageal reflux disease)    "not anymore" " I had a lap band"  . History of diverticulitis of colon    5 YRS AGO  . History of pneumonia    2017  . History of transient ischemic attack (TIA)    CAROTID DOPPLERS NOV 2011  0-39& BIL. STENOSIS  . Hyperlipidemia   . Hypertension   . Mitral regurgitation   . Myocardial infarction ([place])   . Neuromuscular disorder ([place])    left arm numbness   . 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 PTSD (post-traumatic stress disorder)    from [place]  . 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 Shortness of breath dyspnea    with exertion  . Sleep apnea    cpap- see ov note in [facility] 05/12/11 for settings     Tobacco Use: Social History   Tobacco Use  Smoking Status Never Smoker  Smokeless Tobacco Never Used    Labs: Recent Review Flowsheet Data    Labs for ITP Cardiac and Pulmonary Rehab Latest Ref Rng & Units 03/26/2016 08/07/2016 10/14/2016 10/06/2017 12/09/2017   Cholestrol 0 - 200 mg/dL - 159 136 188 151   LDLCALC 0 - 99 mg/dL - 60 76 92 75   HDL >40 mg/dL - 30(L) 36(L) 49 38(L)   Trlycerides <150 mg/dL - 345(H) 122 237(H) 188(H)   Hemoglobin A1c 4.8 - 5.6 % - - - - -   PHART 7.350 - 7.450 - - - - -   PCO2ART 35.0 - 45.0 mmHg - - - - -   HCO3 20.0 - 24.0 mEq/L - - - - -   TCO2 0 - 100 mmol/L 22 - - - -   ACIDBASEDEF 0.0 - 2.0 mmol/L - - - - -   O2SAT % - - - - -      Capillary Blood Glucose: Lab Results  Component Value Date   GLUCAP 157 (H) 11/24/2017   GLUCAP 144 (H) 11/24/2017   GLUCAP 168 (H) 11/22/2017   GLUCAP 135 (H) 11/22/2017   GLUCAP 151 (H) 11/18/2017     Exercise Target Goals:    Exercise Program Goal: Individual exercise prescription  set using results from initial 6 min walk test and THRR while considering  patient's activity barriers and safety.   Exercise Prescription Goal: Initial exercise prescription builds to 30-45 minutes a day of aerobic activity, 2-3 days per  week.  Home exercise guidelines will be given to patient during program as part of exercise prescription that the participant will acknowledge.  Activity Barriers & Risk Stratification: Activity Barriers & Cardiac Risk Stratification - 11/18/17 0920    Activity Barriers & Cardiac Risk Stratification          Activity Barriers  Right Hip Replacement;Other (comment);Back Problems;Deconditioning;Muscular Weakness;Arthritis    Comments  R toe discomfort    Cardiac Risk Stratification  High           6 Minute Walk: 6 Minute Walk    6 Minute Walk    Row Name 11/18/17 1039   Phase  Initial   Distance  1723 feet   Distance % Change  0 %   Distance Feet Change  0 ft   Walk Time  6 minutes   # of Rest Breaks  0   MPH  3.26   METS  3.31   RPE  13   Perceived Dyspnea   0   VO2 Peak  11.58   Symptoms  Yes (comment)   Comments  Right Toe Discomfort +5/10   Resting HR  64 bpm   Resting BP  118/62   Resting Oxygen Saturation   97 %   Exercise Oxygen Saturation  during 6 min walk  100 %   Max Ex. HR  100 bpm   Max Ex. BP  128/70   2 Minute Post BP  118/70          Oxygen Initial Assessment:   Oxygen Re-Evaluation:   Oxygen Discharge (Final Oxygen Re-Evaluation):   Initial Exercise Prescription: Initial Exercise Prescription - 11/18/17 1100    Date of Initial Exercise RX and Referring Provider          Date  11/18/17    Referring Provider  [name] MD        Treadmill          MPH  2.8    Grade  0    Minutes  10    METs  3.14        Bike          Level  1    Minutes  10    METs  2.99        NuStep          Level  4    SPM  70    Minutes  10    METs  2.6        Prescription Details          Frequency  (times per week)  3x    Duration  Progress to 30 minutes of continuous aerobic without signs/symptoms of physical distress        Intensity          THRR 40-80% of Max Heartrate  59-118    Ratings of Perceived Exertion  11-15    Perceived Dyspnea  0-4        Progression          Progression  Continue progressive overload as per policy without signs/symptoms or physical distress.        Resistance Training          Training Prescription  Yes    Weight  3lbs    Reps  10-15           Perform Capillary Blood Glucose checks as needed.  Exercise Prescription Changes: Exercise Prescription Changes    Response  to Exercise    Row Name 11/22/17 1600 12/08/17 0816   Blood Pressure (Admit)  106/72  114/68   Blood Pressure (Exercise)  128/70  126/70   Blood Pressure (Exit)  100/70  104/62   Heart Rate (Admit)  72 bpm  67 bpm   Heart Rate (Exercise)  89 bpm  87 bpm   Heart Rate (Exit)  83 bpm  77 bpm   Rating of Perceived Exertion (Exercise)  12  11   Perceived Dyspnea (Exercise)  no documentation  0   Symptoms  None  None   Comments  Pt oriented to exercise equipment  no documentation   Duration  Progress to 30 minutes of  aerobic without signs/symptoms of physical distress  Continue with 30 min of aerobic exercise without signs/symptoms of physical distress.   Intensity  THRR New  THRR unchanged       Progression    Row Name 11/22/17 1600 12/08/17 0816   Progression  Continue to progress workloads to maintain intensity without signs/symptoms of physical distress.  Continue to progress workloads to maintain intensity without signs/symptoms of physical distress.   Average METs  2.9  3.53       Resistance Training    Row Name 11/22/17 1600 12/08/17 0816   Training Prescription  Yes  Yes   Weight  3lbs  6lbs   Reps  10-15  10-15   Time  10 Minutes  10 Minutes       Interval Training    Row Name 11/22/17 1600 12/08/17 0816   Interval Training  no documentation  No        Treadmill    Row Name 11/22/17 1600 12/08/17 0816   MPH  2.8  2.8   Grade  0  3   Minutes  10  10   METs  3.14  4.3       Bike    Row Name 11/22/17 1600 12/08/17 0816   Level  1  1.3   Minutes  10  10   METs  2.96  3.6       NuStep    Row Name 11/22/17 1600 12/08/17 0816   Level  4  4   SPM  85  85   Minutes  10  10   METs  2.6  3       Home Exercise Plan    Row Name 11/22/17 1600 12/08/17 0816   Plans to continue exercise at  no documentation  [place] (comment) [employer]   Frequency  no documentation  Add 3 additional days to program exercise sessions.   Initial Home Exercises Provided  no documentation  12/06/17          Exercise Comments: Exercise Comments    Row Name 11/22/17 1626 12/09/17 0819 12/09/17 0820   Exercise Comments  Pt oriented to exericse equipment. Pt off to a great start. Will continue to monitor and follow up with pt.   Pt is doing great in cardiac rehab with exercise. Pt is responding well to workload increases. Will continue to monitor and progress pt.   Pt is doing great in cardiac rehab with exercise. Pt is responding well to workload increases. Reveiwed HEP with pt. Pt is currently exericses 3 days a week at local fitness center. Will continue to monitor and progress pt.       Exercise Goals and Review: Exercise Goals    Exercise Goals    Row Name  11/18/17 0921   Increase Physical Activity  Yes   Intervention  Provide advice, education, support and counseling about physical activity/exercise needs.;Develop an individualized exercise prescription for aerobic and resistive training based on initial evaluation findings, risk stratification, comorbidities and participant's personal goals.   Expected Outcomes  Short Term: Attend rehab on a regular basis to increase amount of physical activity.;Long Term: Exercising regularly at least 3-5 days a week.;Long Term: Add in home exercise to make exercise part of routine and to  increase amount of physical activity.   Increase Strength and Stamina  Yes Develop exercise routine to get back to gym. Also increase energy levels.   Intervention  Provide advice, education, support and counseling about physical activity/exercise needs.;Develop an individualized exercise prescription for aerobic and resistive training based on initial evaluation findings, risk stratification, comorbidities and participant's personal goals.   Expected Outcomes  Short Term: Increase workloads from initial exercise prescription for resistance, speed, and METs.;Short Term: Perform resistance training exercises routinely during rehab and add in resistance training at home;Long Term: Improve cardiorespiratory fitness, muscular endurance and strength as measured by increased METs and functional capacity (6MWT)   Able to understand and use rate of perceived exertion (RPE) scale  Yes   Intervention  Provide education and explanation on how to use RPE scale   Expected Outcomes  Short Term: Able to use RPE daily in rehab to express subjective intensity level;Long Term:  Able to use RPE to guide intensity level when exercising independently   Knowledge and understanding of Target Heart Rate Range (THRR)  Yes   Intervention  Provide education and explanation of THRR including how the numbers were predicted and where they are located for reference   Expected Outcomes  Short Term: Able to state/look up THRR;Long Term: Able to use THRR to govern intensity when exercising independently   Able to check pulse independently  Yes   Intervention  Provide education and demonstration on how to check pulse in carotid and radial arteries.;Review the importance of being able to check your own pulse for safety during independent exercise   Expected Outcomes  Short Term: Able to explain why pulse checking is important during independent exercise;Long Term: Able to check pulse independently and accurately   Understanding of  Exercise Prescription  Yes   Intervention  Provide education, explanation, and written materials on patient's individual exercise prescription   Expected Outcomes  Short Term: Able to explain program exercise prescription;Long Term: Able to explain home exercise prescription to exercise independently          Exercise Goals Re-Evaluation : Exercise Goals Re-Evaluation    Exercise Goal Re-Evaluation    Row Name 12/09/17 0820 12/09/17 0821   Exercise Goals Review  Increase Physical Activity  Increase Physical Activity;Increase Strength and Stamina;Able to understand and use rate of perceived exertion (RPE) scale;Knowledge and understanding of Target Heart Rate Range (THRR);Able to check pulse independently;Understanding of Exercise Prescription   Comments  no documentation  Reviewed Home Exercise Program with pt. Also reviewed RPE Scale, THRR, NTG use, weather conditions, endpoints of exercise. Pt is responding great to exercise and is continuing to progress workloads.    Expected Outcomes  no documentation  Pt will continue to exercise 3 additional days in addition to exercise at cardiac rehab. Pt will continue to increase funtional capacity. Will continue to monitor and progress pt as tolerated.            Discharge Exercise Prescription (Final Exercise Prescription Changes): Exercise Prescription  Changes - 12/08/17 0816    Response to Exercise          Blood Pressure (Admit)  114/68    Blood Pressure (Exercise)  126/70    Blood Pressure (Exit)  104/62    Heart Rate (Admit)  67 bpm    Heart Rate (Exercise)  87 bpm    Heart Rate (Exit)  77 bpm    Rating of Perceived Exertion (Exercise)  11    Perceived Dyspnea (Exercise)  0    Symptoms  None    Duration  Continue with 30 min of aerobic exercise without signs/symptoms of physical distress.    Intensity  THRR unchanged        Progression          Progression  Continue to progress workloads to maintain intensity without  signs/symptoms of physical distress.    Average METs  3.53        Resistance Training          Training Prescription  Yes    Weight  6lbs    Reps  10-15    Time  10 Minutes        Interval Training          Interval Training  No        Treadmill          MPH  2.8    Grade  3    Minutes  10    METs  4.3        Bike          Level  1.3    Minutes  10    METs  3.6        NuStep          Level  4    SPM  85    Minutes  10    METs  3        Home Exercise Plan          Plans to continue exercise at  [employer] (comment) [employer]    Frequency  Add 3 additional days to program exercise sessions.    Initial Home Exercises Provided  12/06/17           Nutrition:  Target Goals: Understanding of nutrition guidelines, daily intake of sodium 1500mg , cholesterol 200mg , calories 30% from fat and 7% or less from saturated fats, daily to have 5 or more servings of fruits and vegetables.  Biometrics: Pre Biometrics - 11/18/17 1046    Pre Biometrics          Height  5\' 9"  (1.753 m)    Weight  211 lb 13.8 oz (96.1 kg)    Waist Circumference  43.5 inches    Hip Circumference  42 inches    Waist to Hip Ratio  1.04 %    BMI (Calculated)  31.27    Triceps Skinfold  10 mm    % Body Fat  28.9 %    Grip Strength  46 kg    Flexibility  9.5 in    Single Leg Stand  5 seconds            Nutrition Therapy Plan and Nutrition Goals: Nutrition Therapy & Goals - 11/18/17 1123    Nutrition Therapy          Diet  Carb Modified, Therapeutic Lifestyle Changes        Personal Nutrition Goals  Nutrition Goal  1-2 lb wt loss/week to a wt loss goal of 6-24 lb at graduation from [place] Goal #2  Improved glycemic control as evidenced by an A1c trending from 7.2 toward less than 7        Intervention Plan          Intervention  Prescribe, educate and counsel regarding individualized specific dietary modifications aiming towards  targeted core components such as weight, hypertension, lipid management, diabetes, heart failure and other comorbidities.    Expected Outcomes  Short Term Goal: Understand basic principles of dietary content, such as calories, fat, sodium, cholesterol and nutrients.;Long Term Goal: Adherence to prescribed nutrition plan.           Nutrition Assessments: Nutrition Assessments - 11/18/17 1124    MEDFICTS Scores          Pre Score  17           Nutrition Goals Re-Evaluation:   Nutrition Goals Re-Evaluation:   Nutrition Goals Discharge (Final Nutrition Goals Re-Evaluation):   Psychosocial: Target Goals: Acknowledge presence or absence of significant depression and/or stress, maximize coping skills, provide positive support system. Participant is able to verbalize types and ability to use techniques and skills needed for reducing stress and depression.  Initial Review & Psychosocial Screening: Initial Psych Review & Screening - 11/18/17 1150    Initial Review          Current issues with  None Identified        Family Dynamics          Good Support System?  Yes Pt reports that he has supportive wife, family, and friends.         Barriers          Psychosocial barriers to participate in program  There are no identifiable barriers or psychosocial needs.        Screening Interventions          Interventions  Encouraged to exercise    Expected Outcomes  Short Term goal: Identification and review with participant of any Quality of Life or Depression concerns found by scoring the questionnaire.;Long Term goal: The participant improves quality of Life and PHQ9 Scores as seen by post scores and/or verbalization of changes           Quality of Life Scores: Quality of Life - 11/18/17 1047    Quality of Life Scores          Health/Function Pre  20.23 %    Socioeconomic Pre  29.25 %    Psych/Spiritual Pre  30 %    Family Pre  19.2 %    GLOBAL Pre  24.1 %           Scores of 19 and below usually indicate a poorer quality of life in these areas.  A difference of  2-3 points is a clinically meaningful difference.  A difference of 2-3 points in the total score of the Quality of Life Index has been associated with significant improvement in overall quality of life, self-image, physical symptoms, and general health in studies assessing change in quality of life.  PHQ-9: Recent Review Flowsheet Data    Depression screen [facility] 2/9 11/22/2017 08/21/2016 05/11/2016 02/14/2014   Decreased Interest 0 0 0 0   Down, Depressed, Hopeless 0 0 0 0   PHQ - 2 Score 0 0 0 0     Interpretation of Total Score  Total Score Depression Severity:  1-4 = Minimal depression, 5-9 = Mild depression, 10-14 = Moderate depression, 15-19 = Moderately severe depression, 20-27 = Severe depression   Psychosocial Evaluation and Intervention: Psychosocial Evaluation - 12/08/17 1149    Psychosocial Evaluation & Interventions          Interventions  Encouraged to exercise with the program and follow exercise prescription    Comments  no psychosocial needs identified, no interventions necessary     Expected Outcomes  pt will exhibit positive outlook with good coping skills.     Continue Psychosocial Services   No Follow up required           Psychosocial Re-Evaluation: Psychosocial Re-Evaluation    Psychosocial Re-Evaluation    [place] Name 12/09/17 1348   Current issues with  None Identified   Comments  no psychosocial needs identified, no interventions necessary    Expected Outcomes  pt will exhibit positive outlook with good coping skills.    Interventions  Encouraged to attend Cardiac Rehabilitation for the exercise   Continue Psychosocial Services   No Follow up required          Psychosocial Discharge (Final Psychosocial Re-Evaluation): Psychosocial Re-Evaluation - 12/09/17 1348    Psychosocial Re-Evaluation          Current issues with  None Identified    Comments  no  psychosocial needs identified, no interventions necessary     Expected Outcomes  pt will exhibit positive outlook with good coping skills.     Interventions  Encouraged to attend Cardiac Rehabilitation for the exercise    Continue Psychosocial Services   No Follow up required           Vocational Rehabilitation: Provide vocational rehab assistance to qualifying candidates.   Vocational Rehab Evaluation & Intervention: Vocational Rehab - 11/18/17 1147    Initial Vocational Rehab Evaluation & Intervention          Assessment shows need for Vocational Rehabilitation  No           Education: Education Goals: Education classes will be provided on a weekly basis, covering required topics. Participant will state understanding/return demonstration of topics presented.  Learning Barriers/Preferences: Learning Barriers/Preferences - 11/18/17 1660    Learning Barriers/Preferences          Learning Barriers  None    Learning Preferences  Skilled Demonstration           Education Topics: Count Your Pulse:  -Group instruction provided by verbal instruction, demonstration, patient participation and written materials to support subject.  Instructors address importance of being able to find your pulse and how to count your pulse when at home without a heart monitor.  Patients get hands on experience counting their pulse with staff help and individually. Flowsheet Row CARDIAC REHAB PHASE II EXERCISE from 12/03/2017 in [place]  Date  12/03/17  Instruction Review Code  1- Verbalizes Understanding      Heart Attack, Angina, and Risk Factor Modification:  -Group instruction provided by verbal instruction, video, and written materials to support subject.  Instructors address signs and symptoms of angina and heart attacks.    Also discuss risk factors for heart disease and how to make changes to improve heart health risk factors.   Functional Fitness:   -Group instruction provided by verbal instruction, demonstration, patient participation, and written materials to support subject.  Instructors address safety measures for doing things around the house.  Discuss how to get up and down off the  floor, how to pick things up properly, how to safely get out of a chair without assistance, and balance training.   Meditation and Mindfulness:  -Group instruction provided by verbal instruction, patient participation, and written materials to support subject.  Instructor addresses importance of mindfulness and meditation practice to help reduce stress and improve awareness.  Instructor also leads participants through a meditation exercise.    Stretching for Flexibility and Mobility:  -Group instruction provided by verbal instruction, patient participation, and written materials to support subject.  Instructors lead participants through series of stretches that are designed to increase flexibility thus improving mobility.  These stretches are additional exercise for major muscle groups that are typically performed during regular warm up and cool down.   Hands Only CPR:  -Group verbal, video, and participation provides a basic overview of AHA guidelines for community CPR. Role-play of emergencies allow participants the opportunity to practice calling for help and chest compression technique with discussion of AED use.   Hypertension: -Group verbal and written instruction that provides a basic overview of hypertension including the most recent diagnostic guidelines, risk factor reduction with self-care instructions and medication management.    Nutrition I class: Heart Healthy Eating:  -Group instruction provided by PowerPoint slides, verbal discussion, and written materials to support subject matter. The instructor gives an explanation and review of the Therapeutic Lifestyle Changes diet recommendations, which includes a discussion on lipid goals, dietary  fat, sodium, fiber, plant stanol/sterol esters, sugar, and the components of a well-balanced, healthy diet. Flowsheet Row CARDIAC REHAB PHASE II EXERCISE from 07/10/2016 in [place]  Date  05/22/16  Educator  RD  Instruction Review Code (Retired)  Not applicable [employer] handouts given]      Nutrition II class: Lifestyle Skills:  -Group instruction provided by [employer], verbal discussion, and written materials to support subject matter. The instructor gives an explanation and review of label reading, grocery shopping for heart health, heart healthy recipe modifications, and ways to make healthier choices when eating out. Flowsheet Row CARDIAC REHAB PHASE II EXERCISE from 07/10/2016 in [place]  Date  05/22/16  Educator  RD  Instruction Review Code (Retired)  Not applicable [employer] handouts given]      Diabetes Question & Answer:  -Group instruction provided by PowerPoint slides, verbal discussion, and written materials to support subject matter. The instructor gives an explanation and review of diabetes co-morbidities, pre- and post-prandial blood glucose goals, pre-exercise blood glucose goals, signs, symptoms, and treatment of hypoglycemia and hyperglycemia, and foot care basics.   Diabetes Blitz:  -Group instruction provided by PowerPoint slides, verbal discussion, and written materials to support subject matter. The instructor gives an explanation and review of the physiology behind type 1 and type 2 diabetes, diabetes medications and rational behind using different medications, pre- and post-prandial blood glucose recommendations and Hemoglobin A1c goals, diabetes diet, and exercise including blood glucose guidelines for exercising safely.  Flowsheet Row CARDIAC REHAB PHASE II EXERCISE from 07/10/2016 in [place]  Date  05/22/16  Educator  RD  Instruction Review Code (Retired)   Not applicable [employer] handouts given]      Portion Distortion:  -Group instruction provided by [employer], verbal discussion, written materials, and food models to support subject matter. The instructor gives an explanation of serving size versus portion size, changes in portions sizes over the last 20 years, and what consists of a serving from each food group.  Stress Management:  -Group instruction provided by verbal instruction, video, and written materials to support subject matter.  Instructors review role of stress in heart disease and how to cope with stress positively.   Flowsheet Row CARDIAC REHAB PHASE II EXERCISE from 12/03/2017 in [place]  Date  11/24/17  Instruction Review Code  2- Demonstrated Understanding      Exercising on Your Own:  -Group instruction provided by verbal instruction, power point, and written materials to support subject.  Instructors discuss benefits of exercise, components of exercise, frequency and intensity of exercise, and end points for exercise.  Also discuss use of nitroglycerin and activating EMS.  Review options of places to exercise outside of rehab.  Review guidelines for sex with heart disease. Flowsheet Row CARDIAC REHAB PHASE II EXERCISE from 07/10/2016 in [place]  Date  07/10/16  Instruction Review Code (Retired)  2- meets goals/outcomes      Cardiac Drugs I:  -Group instruction provided by verbal instruction and written materials to support subject.  Instructor reviews cardiac drug classes: antiplatelets, anticoagulants, beta blockers, and statins.  Instructor discusses reasons, side effects, and lifestyle considerations for each drug class.   Cardiac Drugs II:  -Group instruction provided by verbal instruction and written materials to support subject.  Instructor reviews cardiac drug classes: angiotensin converting enzyme inhibitors (ACE-I), angiotensin II  receptor blockers (ARBs), nitrates, and calcium channel blockers.  Instructor discusses reasons, side effects, and lifestyle considerations for each drug class. Flowsheet Row CARDIAC REHAB PHASE II EXERCISE from 12/03/2017 in [place]  Date  12/01/17  Instruction Review Code  2- Demonstrated Understanding      Anatomy and Physiology of the Circulatory System:  Group verbal and written instruction and models provide basic cardiac anatomy and physiology, with the coronary electrical and arterial systems. Review of: AMI, Angina, Valve disease, Heart Failure, Peripheral Artery Disease, Cardiac Arrhythmia, Pacemakers, and the ICD.   Other Education:  -Group or individual verbal, written, or video instructions that support the educational goals of the cardiac rehab program.   Holiday Eating Survival Tips:  -Group instruction provided by PowerPoint slides, verbal discussion, and written materials to support subject matter. The instructor gives patients tips, tricks, and techniques to help them not only survive but enjoy the holidays despite the onslaught of food that accompanies the holidays.   Knowledge Questionnaire Score: Knowledge Questionnaire Score - 11/18/17 1047    Knowledge Questionnaire Score          Pre Score  22/24           Core Components/Risk Factors/Patient Goals at Admission: Personal Goals and Risk Factors at Admission - 11/18/17 1038    Core Components/Risk Factors/Patient Goals on Admission           Weight Management  Yes;Weight Loss    Intervention  Weight Management: Develop a combined nutrition and exercise program designed to reach desired caloric intake, while maintaining appropriate intake of nutrient and fiber, sodium and fats, and appropriate energy expenditure required for the weight goal.;Weight Management: Provide education and appropriate resources to help participant work on and attain dietary goals.;Weight  Management/Obesity: Establish reasonable short term and long term weight goals.;Obesity: Provide education and appropriate resources to help participant work on and attain dietary goals.    Admit Weight  211 lb (95.7 kg)    Goal Weight: Short Term  208 lb (94.3 kg)    Goal Weight: Long Term  199 lb (90.3 kg)    Expected Outcomes  Short Term: Continue to assess and modify interventions until short term weight is achieved;Long Term: Adherence to nutrition and physical activity/exercise program aimed toward attainment of established weight goal;Weight Loss: Understanding of general recommendations for a balanced deficit meal plan, which promotes 1-2 lb weight loss per week and includes a negative energy balance of [phone] kcal/d;Understanding recommendations for meals to include 15-35% energy as protein, 25-35% energy from fat, 35-60% energy from carbohydrates, less than 200mg  of dietary cholesterol, 20-35 gm of total fiber daily;Understanding of distribution of calorie intake throughout the day with the consumption of 4-5 meals/snacks    Diabetes  Yes    Intervention  Provide education about signs/symptoms and action to take for hypo/hyperglycemia.;Provide education about proper nutrition, including hydration, and aerobic/resistive exercise prescription along with prescribed medications to achieve blood glucose in normal ranges: Fasting glucose 65-99 mg/dL    Expected Outcomes  Short Term: Participant verbalizes understanding of the signs/symptoms and immediate care of hyper/hypoglycemia, proper foot care and importance of medication, aerobic/resistive exercise and nutrition plan for blood glucose control.;Long Term: Attainment of HbA1C < 7%.    Hypertension  Yes    Intervention  Provide education on lifestyle modifcations including regular physical activity/exercise, weight management, moderate sodium restriction and increased consumption of fresh fruit, vegetables, and low fat dairy, alcohol moderation,  and smoking cessation.;Monitor prescription use compliance.    Expected Outcomes  Short Term: Continued assessment and intervention until BP is < 140/64mm HG in hypertensive participants. < 130/8mm HG in hypertensive participants with diabetes, heart failure or chronic kidney disease.;Long Term: Maintenance of blood pressure at goal levels.    Lipids  Yes    Intervention  Provide education and support for participant on nutrition & aerobic/resistive exercise along with prescribed medications to achieve LDL 70mg , HDL >40mg .    Expected Outcomes  Short Term: Participant states understanding of desired cholesterol values and is compliant with medications prescribed. Participant is following exercise prescription and nutrition guidelines.;Long Term: Cholesterol controlled with medications as prescribed, with individualized exercise RX and with personalized nutrition plan. Value goals: LDL < 70mg , HDL > 40 mg.           Core Components/Risk Factors/Patient Goals Review:  Goals and Risk Factor Review    Core Components/Risk Factors/Patient Goals Review    Row Name 12/08/17 1147   Personal Goals Review  Other;Weight Management/Obesity;Diabetes;Hypertension;Lipids;Stress   Review  pt with multiiple CAD RF demonstrates eagerness to particiate in CR program at this time.    Expected Outcomes  pt will participatein CR exercise, nutrition and lifestyle modification education to decrease overall RF.           Core Components/Risk Factors/Patient Goals at Discharge (Final Review):  Goals and Risk Factor Review - 12/08/17 1147    Core Components/Risk Factors/Patient Goals Review          Personal Goals Review  Other;Weight Management/Obesity;Diabetes;Hypertension;Lipids;Stress    Review  pt with multiiple CAD RF demonstrates eagerness to particiate in CR program at this time.     Expected Outcomes  pt will participatein CR exercise, nutrition and lifestyle modification education to decrease overall  RF.            ITP Comments: 30 day ITP review.  Pt with good attendance and participation.   Andi Hence, RN, BSN Cardiac Pulmonary Rehab 12/09/17 1:51 PM

## 2017-12-10 ENCOUNTER — Ambulatory Visit (HOSPITAL_COMMUNITY): Payer: Medicare Other

## 2017-12-10 ENCOUNTER — Telehealth (HOSPITAL_COMMUNITY): Payer: Self-pay | Admitting: Cardiac Rehabilitation

## 2017-12-10 ENCOUNTER — Telehealth (HOSPITAL_COMMUNITY): Payer: Self-pay | Admitting: *Deleted

## 2017-12-10 ENCOUNTER — Encounter (HOSPITAL_COMMUNITY)
Admission: RE | Admit: 2017-12-10 | Discharge: 2017-12-10 | Disposition: A | Discharge status: Home / Self Care | Payer: Medicare Other | Source: Ambulatory Visit | Attending: Cardiology | Admitting: Cardiology

## 2017-12-10 ENCOUNTER — Encounter (HOSPITAL_COMMUNITY): Payer: Self-pay

## 2017-12-10 ENCOUNTER — Encounter (HOSPITAL_COMMUNITY): Payer: Medicare Other

## 2017-12-10 DIAGNOSIS — I25118 Atherosclerotic heart disease of native coronary artery with other forms of angina pectoris: Secondary | ICD-10-CM | POA: Diagnosis not present

## 2017-12-10 DIAGNOSIS — Z79899 Other long term (current) drug therapy: Secondary | ICD-10-CM | POA: Diagnosis not present

## 2017-12-10 DIAGNOSIS — I208 Other forms of angina pectoris: Secondary | ICD-10-CM

## 2017-12-10 DIAGNOSIS — I509 Heart failure, unspecified: Secondary | ICD-10-CM | POA: Diagnosis not present

## 2017-12-10 DIAGNOSIS — Z9889 Other specified postprocedural states: Secondary | ICD-10-CM

## 2017-12-10 NOTE — Telephone Encounter (Signed)
[name] from Cardiac Rehab called to report pt is more fatigued, increased shortness of breath, and his weight is up 4lbs in 1 week. Per Dr.McLean increase lasix to 80mg  in the AM and 40mg  in the PM for 3 days and to call Monday if no improvement. Pt aware and agreeable.

## 2017-12-10 NOTE — Telephone Encounter (Signed)
Pt c/o fatigue, swelling and generalized muscle ache. Pt reports this is chronic for him, however worse today. Pt weight  96.1kg, (up 2kg over 4 days).  Pt c/o fingers swelling causing ring tightness.   Pt denies dyspnea or chest pain.  PC to [facility], Dr. Oleh Genin, nurse.  Jasmine will contact pt to schedule appointment and further advise.   Andi Hence, RN, BSN Cardiac Pulmonary Rehab 12/10/17 10:56 AM

## 2017-12-13 ENCOUNTER — Ambulatory Visit (HOSPITAL_COMMUNITY): Payer: Medicare Other

## 2017-12-13 ENCOUNTER — Encounter (HOSPITAL_COMMUNITY)
Admission: RE | Admit: 2017-12-13 | Discharge: 2017-12-13 | Disposition: A | Discharge status: Home / Self Care | Payer: Medicare Other | Source: Ambulatory Visit | Attending: Cardiology | Admitting: Cardiology

## 2017-12-13 ENCOUNTER — Encounter (HOSPITAL_COMMUNITY): Payer: Medicare Other

## 2017-12-13 DIAGNOSIS — Z79899 Other long term (current) drug therapy: Secondary | ICD-10-CM | POA: Diagnosis not present

## 2017-12-13 DIAGNOSIS — I25118 Atherosclerotic heart disease of native coronary artery with other forms of angina pectoris: Secondary | ICD-10-CM | POA: Diagnosis not present

## 2017-12-13 DIAGNOSIS — I509 Heart failure, unspecified: Secondary | ICD-10-CM | POA: Diagnosis not present

## 2017-12-13 DIAGNOSIS — I208 Other forms of angina pectoris: Secondary | ICD-10-CM

## 2017-12-15 ENCOUNTER — Encounter (HOSPITAL_COMMUNITY)
Admission: RE | Admit: 2017-12-15 | Discharge: 2017-12-15 | Disposition: A | Discharge status: Home / Self Care | Payer: Medicare Other | Source: Ambulatory Visit | Attending: Cardiology | Admitting: Cardiology

## 2017-12-15 ENCOUNTER — Encounter (HOSPITAL_COMMUNITY): Payer: Medicare Other

## 2017-12-15 ENCOUNTER — Ambulatory Visit (HOSPITAL_COMMUNITY): Payer: Medicare Other

## 2017-12-15 DIAGNOSIS — I208 Other forms of angina pectoris: Secondary | ICD-10-CM

## 2017-12-15 DIAGNOSIS — Z79899 Other long term (current) drug therapy: Secondary | ICD-10-CM | POA: Diagnosis not present

## 2017-12-15 DIAGNOSIS — I509 Heart failure, unspecified: Secondary | ICD-10-CM | POA: Diagnosis not present

## 2017-12-15 DIAGNOSIS — I25118 Atherosclerotic heart disease of native coronary artery with other forms of angina pectoris: Secondary | ICD-10-CM | POA: Diagnosis not present

## 2017-12-16 DIAGNOSIS — N183 Chronic kidney disease, stage 3 (moderate): Secondary | ICD-10-CM | POA: Diagnosis not present

## 2017-12-16 DIAGNOSIS — Z9889 Other specified postprocedural states: Secondary | ICD-10-CM | POA: Diagnosis not present

## 2017-12-16 DIAGNOSIS — D631 Anemia in chronic kidney disease: Secondary | ICD-10-CM | POA: Diagnosis not present

## 2017-12-16 DIAGNOSIS — I509 Heart failure, unspecified: Secondary | ICD-10-CM | POA: Diagnosis not present

## 2017-12-16 DIAGNOSIS — I4891 Unspecified atrial fibrillation: Secondary | ICD-10-CM | POA: Diagnosis not present

## 2017-12-16 DIAGNOSIS — E1122 Type 2 diabetes mellitus with diabetic chronic kidney disease: Secondary | ICD-10-CM | POA: Diagnosis not present

## 2017-12-16 DIAGNOSIS — I129 Hypertensive chronic kidney disease with stage 1 through stage 4 chronic kidney disease, or unspecified chronic kidney disease: Secondary | ICD-10-CM | POA: Diagnosis not present

## 2017-12-16 DIAGNOSIS — E669 Obesity, unspecified: Secondary | ICD-10-CM | POA: Diagnosis not present

## 2017-12-16 DIAGNOSIS — N2581 Secondary hyperparathyroidism of renal origin: Secondary | ICD-10-CM | POA: Diagnosis not present

## 2017-12-16 DIAGNOSIS — Z79899 Other long term (current) drug therapy: Secondary | ICD-10-CM | POA: Diagnosis not present

## 2017-12-16 DIAGNOSIS — E785 Hyperlipidemia, unspecified: Secondary | ICD-10-CM | POA: Diagnosis not present

## 2017-12-16 DIAGNOSIS — I2510 Atherosclerotic heart disease of native coronary artery without angina pectoris: Secondary | ICD-10-CM | POA: Diagnosis not present

## 2017-12-17 ENCOUNTER — Ambulatory Visit (HOSPITAL_COMMUNITY): Payer: Medicare Other

## 2017-12-17 ENCOUNTER — Encounter (HOSPITAL_COMMUNITY)
Admission: RE | Admit: 2017-12-17 | Discharge: 2017-12-17 | Disposition: A | Discharge status: Home / Self Care | Payer: Medicare Other | Source: Ambulatory Visit | Attending: Cardiology | Admitting: Cardiology

## 2017-12-17 ENCOUNTER — Encounter (HOSPITAL_COMMUNITY): Payer: Medicare Other

## 2017-12-17 DIAGNOSIS — I509 Heart failure, unspecified: Secondary | ICD-10-CM | POA: Diagnosis not present

## 2017-12-17 DIAGNOSIS — I208 Other forms of angina pectoris: Secondary | ICD-10-CM

## 2017-12-17 DIAGNOSIS — Z79899 Other long term (current) drug therapy: Secondary | ICD-10-CM | POA: Diagnosis not present

## 2017-12-17 DIAGNOSIS — I25118 Atherosclerotic heart disease of native coronary artery with other forms of angina pectoris: Secondary | ICD-10-CM | POA: Diagnosis not present

## 2017-12-17 NOTE — Progress Notes (Signed)
Daily Session Note  Patient Details  Name: [name] MRN: [id number] Date of Birth: [birth year] Referring Provider:   Flowsheet Row CARDIAC REHAB PHASE II ORIENTATION from 11/18/2017 in [place]  Referring Provider  [name] MD      Encounter Date: 12/17/2017  Check In: Session Check In - 12/17/17 0813    Check-In          Location  [facility]    Staff Present  Dorna Bloom, MS, ACSM RCEP, Exercise Physiologist;Joann Rion, RN, [facility] [place], ACSM RCEP, Exercise Physiologist;Tara Karle Starch, RN BSN    Supervising physician immediately available to respond to emergencies  [facility] immediately available    Physician(s)  Dr. Maylene Roes    Medication changes reported      No    Fall or balance concerns reported     No    Tobacco Cessation  No Change    Warm-up and Cool-down  Performed as group-led instruction    Resistance Training Performed  Yes    VAD Patient?  No        Pain Assessment          Currently in Pain?  No/denies    Multiple Pain Sites  No           Capillary Blood Glucose: No results found for this or any previous visit (from the past 24 hour(s)).    Social History   Tobacco Use  Smoking Status Never Smoker  Smokeless Tobacco Never Used    Goals Met:  Exercise tolerated well  Goals Unmet:  Not Applicable  Comments: pt c/o continued fatigue and decreased stamina. Pt reports he recently tried to walk steep incline 10 times and is concerned because he was not able to accomplish without rest breaks. Pt also concerned thathe notices continued fatigue with  his day-day activities.  This is not new. Pt also c/o muscle cramps.  He discussed this concern with his nephrologist who asked him to hold statin for 5 days.  Pt interested in resuming testosterone injection.  Message sent to Dr. Aundra Dubin for recommendation.  Pt encouraged to continue current CR and Home exercise plan.  Pt also  congratulated on his current exertional level.   Pt verbalized understanding.  Andi Hence, RN, BSN Cardiac Pulmonary Rehab 12/17/17 9:55 AM    Dr. Fransico Him is Medical Director for Cardiac Rehab at [facility].

## 2017-12-20 ENCOUNTER — Encounter (HOSPITAL_COMMUNITY)
Admission: RE | Admit: 2017-12-20 | Discharge: 2017-12-20 | Disposition: A | Discharge status: Home / Self Care | Payer: Medicare Other | Source: Ambulatory Visit | Attending: Cardiology | Admitting: Cardiology

## 2017-12-20 ENCOUNTER — Encounter (HOSPITAL_COMMUNITY): Payer: Medicare Other

## 2017-12-20 ENCOUNTER — Telehealth (HOSPITAL_COMMUNITY): Payer: Self-pay | Admitting: Cardiac Rehabilitation

## 2017-12-20 ENCOUNTER — Ambulatory Visit (HOSPITAL_COMMUNITY): Payer: Medicare Other

## 2017-12-20 DIAGNOSIS — Z79899 Other long term (current) drug therapy: Secondary | ICD-10-CM | POA: Diagnosis not present

## 2017-12-20 DIAGNOSIS — I208 Other forms of angina pectoris: Secondary | ICD-10-CM

## 2017-12-20 DIAGNOSIS — I25118 Atherosclerotic heart disease of native coronary artery with other forms of angina pectoris: Secondary | ICD-10-CM | POA: Diagnosis not present

## 2017-12-20 DIAGNOSIS — I509 Heart failure, unspecified: Secondary | ICD-10-CM | POA: Diagnosis not present

## 2017-12-20 NOTE — Telephone Encounter (Signed)
-----   Message from [name], MD sent at 12/18/2017  2:19 PM EDT ----- Regarding: RE: cardiac rehab  [facility] for him to do that.   ----- Message ----- From: Lowell Guitar, RN Sent: 12/17/2017   9:41 AM To: [name], MD Subject: cardiac rehab                                  Dear Dr. Aundra Dubin,  Pt is interested in resuming his testosterone injection.  Are there any contraindications from your opinion?  Thank you, Andi Hence, RN, BSN Cardiac Pulmonary Rehab

## 2017-12-22 ENCOUNTER — Ambulatory Visit (HOSPITAL_COMMUNITY): Payer: Medicare Other

## 2017-12-22 ENCOUNTER — Encounter (HOSPITAL_COMMUNITY): Payer: Medicare Other

## 2017-12-22 ENCOUNTER — Encounter (HOSPITAL_COMMUNITY)
Admission: RE | Admit: 2017-12-22 | Discharge: 2017-12-22 | Disposition: A | Discharge status: Home / Self Care | Payer: Medicare Other | Source: Ambulatory Visit | Attending: Cardiology | Admitting: Cardiology

## 2017-12-22 DIAGNOSIS — I25118 Atherosclerotic heart disease of native coronary artery with other forms of angina pectoris: Secondary | ICD-10-CM | POA: Insufficient documentation

## 2017-12-22 DIAGNOSIS — Z79899 Other long term (current) drug therapy: Secondary | ICD-10-CM | POA: Diagnosis not present

## 2017-12-22 DIAGNOSIS — I509 Heart failure, unspecified: Secondary | ICD-10-CM | POA: Diagnosis not present

## 2017-12-22 DIAGNOSIS — I208 Other forms of angina pectoris: Secondary | ICD-10-CM

## 2017-12-22 DIAGNOSIS — Z9889 Other specified postprocedural states: Secondary | ICD-10-CM

## 2017-12-22 LAB — GLUCOSE, CAPILLARY: GLUCOSE-CAPILLARY: 144 mg/dL — AB (ref 65–99)

## 2017-12-24 ENCOUNTER — Encounter (HOSPITAL_COMMUNITY): Payer: Medicare Other

## 2017-12-24 ENCOUNTER — Telehealth (HOSPITAL_COMMUNITY): Payer: Self-pay | Admitting: *Deleted

## 2017-12-24 ENCOUNTER — Encounter (HOSPITAL_COMMUNITY)
Admission: RE | Admit: 2017-12-24 | Discharge: 2017-12-24 | Disposition: A | Discharge status: Home / Self Care | Payer: Medicare Other | Source: Ambulatory Visit | Attending: Cardiology | Admitting: Cardiology

## 2017-12-24 ENCOUNTER — Ambulatory Visit (HOSPITAL_COMMUNITY): Payer: Medicare Other

## 2017-12-24 DIAGNOSIS — I509 Heart failure, unspecified: Secondary | ICD-10-CM | POA: Diagnosis not present

## 2017-12-24 DIAGNOSIS — Z79899 Other long term (current) drug therapy: Secondary | ICD-10-CM | POA: Diagnosis not present

## 2017-12-24 DIAGNOSIS — I25118 Atherosclerotic heart disease of native coronary artery with other forms of angina pectoris: Secondary | ICD-10-CM | POA: Diagnosis not present

## 2017-12-24 DIAGNOSIS — I208 Other forms of angina pectoris: Secondary | ICD-10-CM

## 2017-12-24 DIAGNOSIS — E785 Hyperlipidemia, unspecified: Secondary | ICD-10-CM

## 2017-12-24 NOTE — Telephone Encounter (Signed)
Pt aware and agreeable, referral placed 

## 2017-12-24 NOTE — Telephone Encounter (Signed)
Refer to lipid clinic for Rep[place].  

## 2017-12-24 NOTE — Telephone Encounter (Signed)
Pt states his nephrologist told him to stop his Pravastatin due to c/o joint/muscle aches and see if it helped.  Pt reports he has been off med for a week now and he feels 100% better, he states he has more energy, no joint/muscle aches and has been able to preform better in cardiac rehab.  He would like to know if there is something else he can take to keep his cholesterol down instead of a statin.  Will send to Dr Aundra Dubin for review.

## 2017-12-27 ENCOUNTER — Ambulatory Visit (HOSPITAL_COMMUNITY): Payer: Medicare Other

## 2017-12-27 ENCOUNTER — Encounter (HOSPITAL_COMMUNITY)
Admission: RE | Admit: 2017-12-27 | Discharge: 2017-12-27 | Disposition: A | Discharge status: Home / Self Care | Payer: Medicare Other | Source: Ambulatory Visit | Attending: Cardiology | Admitting: Cardiology

## 2017-12-27 ENCOUNTER — Encounter (HOSPITAL_COMMUNITY): Payer: Medicare Other

## 2017-12-27 DIAGNOSIS — Z9889 Other specified postprocedural states: Secondary | ICD-10-CM

## 2017-12-27 DIAGNOSIS — I25118 Atherosclerotic heart disease of native coronary artery with other forms of angina pectoris: Secondary | ICD-10-CM | POA: Diagnosis not present

## 2017-12-27 DIAGNOSIS — I208 Other forms of angina pectoris: Secondary | ICD-10-CM

## 2017-12-27 DIAGNOSIS — I509 Heart failure, unspecified: Secondary | ICD-10-CM | POA: Diagnosis not present

## 2017-12-27 DIAGNOSIS — Z79899 Other long term (current) drug therapy: Secondary | ICD-10-CM | POA: Diagnosis not present

## 2017-12-29 ENCOUNTER — Encounter (HOSPITAL_COMMUNITY): Payer: Medicare Other

## 2017-12-29 ENCOUNTER — Encounter (HOSPITAL_COMMUNITY)
Admission: RE | Admit: 2017-12-29 | Discharge: 2017-12-29 | Disposition: A | Discharge status: Home / Self Care | Payer: Medicare Other | Source: Ambulatory Visit | Attending: Cardiology | Admitting: Cardiology

## 2017-12-29 ENCOUNTER — Ambulatory Visit (HOSPITAL_COMMUNITY): Payer: Medicare Other

## 2017-12-29 DIAGNOSIS — I208 Other forms of angina pectoris: Secondary | ICD-10-CM

## 2017-12-29 DIAGNOSIS — I25118 Atherosclerotic heart disease of native coronary artery with other forms of angina pectoris: Secondary | ICD-10-CM | POA: Diagnosis not present

## 2017-12-29 DIAGNOSIS — Z79899 Other long term (current) drug therapy: Secondary | ICD-10-CM | POA: Diagnosis not present

## 2017-12-29 DIAGNOSIS — Z9889 Other specified postprocedural states: Secondary | ICD-10-CM

## 2017-12-29 DIAGNOSIS — I509 Heart failure, unspecified: Secondary | ICD-10-CM | POA: Diagnosis not present

## 2017-12-31 ENCOUNTER — Encounter (HOSPITAL_COMMUNITY)
Admission: RE | Admit: 2017-12-31 | Discharge: 2017-12-31 | Disposition: A | Discharge status: Home / Self Care | Payer: Medicare Other | Source: Ambulatory Visit | Attending: Cardiology | Admitting: Cardiology

## 2017-12-31 ENCOUNTER — Encounter (HOSPITAL_COMMUNITY): Payer: Medicare Other

## 2017-12-31 ENCOUNTER — Encounter (HOSPITAL_COMMUNITY): Payer: Self-pay

## 2017-12-31 ENCOUNTER — Ambulatory Visit (HOSPITAL_COMMUNITY): Payer: Medicare Other

## 2017-12-31 DIAGNOSIS — Z9889 Other specified postprocedural states: Secondary | ICD-10-CM

## 2017-12-31 DIAGNOSIS — Z79899 Other long term (current) drug therapy: Secondary | ICD-10-CM | POA: Diagnosis not present

## 2017-12-31 DIAGNOSIS — I25118 Atherosclerotic heart disease of native coronary artery with other forms of angina pectoris: Secondary | ICD-10-CM | POA: Diagnosis not present

## 2017-12-31 DIAGNOSIS — I208 Other forms of angina pectoris: Secondary | ICD-10-CM

## 2017-12-31 DIAGNOSIS — I509 Heart failure, unspecified: Secondary | ICD-10-CM | POA: Diagnosis not present

## 2018-01-03 ENCOUNTER — Encounter (HOSPITAL_COMMUNITY): Payer: Self-pay

## 2018-01-03 ENCOUNTER — Encounter (HOSPITAL_COMMUNITY)
Admission: RE | Admit: 2018-01-03 | Discharge: 2018-01-03 | Disposition: A | Discharge status: Home / Self Care | Payer: Medicare Other | Source: Ambulatory Visit | Attending: Cardiology | Admitting: Cardiology

## 2018-01-03 ENCOUNTER — Encounter (HOSPITAL_COMMUNITY): Payer: Medicare Other

## 2018-01-03 ENCOUNTER — Ambulatory Visit (HOSPITAL_COMMUNITY): Payer: Medicare Other

## 2018-01-03 DIAGNOSIS — I509 Heart failure, unspecified: Secondary | ICD-10-CM | POA: Diagnosis not present

## 2018-01-03 DIAGNOSIS — I208 Other forms of angina pectoris: Secondary | ICD-10-CM

## 2018-01-03 DIAGNOSIS — Z9889 Other specified postprocedural states: Secondary | ICD-10-CM

## 2018-01-03 DIAGNOSIS — I25118 Atherosclerotic heart disease of native coronary artery with other forms of angina pectoris: Secondary | ICD-10-CM | POA: Diagnosis not present

## 2018-01-03 DIAGNOSIS — Z79899 Other long term (current) drug therapy: Secondary | ICD-10-CM | POA: Diagnosis not present

## 2018-01-05 ENCOUNTER — Ambulatory Visit (HOSPITAL_COMMUNITY): Payer: Medicare Other

## 2018-01-05 ENCOUNTER — Encounter (HOSPITAL_COMMUNITY)
Admission: RE | Admit: 2018-01-05 | Discharge: 2018-01-05 | Disposition: A | Discharge status: Home / Self Care | Payer: Medicare Other | Source: Ambulatory Visit | Attending: Cardiology | Admitting: Cardiology

## 2018-01-05 ENCOUNTER — Encounter (HOSPITAL_COMMUNITY): Payer: Medicare Other

## 2018-01-05 DIAGNOSIS — I208 Other forms of angina pectoris: Secondary | ICD-10-CM

## 2018-01-05 DIAGNOSIS — I25118 Atherosclerotic heart disease of native coronary artery with other forms of angina pectoris: Secondary | ICD-10-CM | POA: Diagnosis not present

## 2018-01-05 DIAGNOSIS — Z79899 Other long term (current) drug therapy: Secondary | ICD-10-CM | POA: Diagnosis not present

## 2018-01-05 DIAGNOSIS — I509 Heart failure, unspecified: Secondary | ICD-10-CM | POA: Diagnosis not present

## 2018-01-05 DIAGNOSIS — Z9889 Other specified postprocedural states: Secondary | ICD-10-CM

## 2018-01-06 NOTE — Progress Notes (Signed)
Cardiac Individual Treatment Plan  Patient Details  Name: [name] MRN: [id number] Date of Birth: 11-08-1945 Referring Provider:   Flowsheet Row CARDIAC REHAB PHASE II ORIENTATION from 11/18/2017 in [facility]  Referring Provider  [name] MD      Initial Encounter Date:  [place] PHASE II ORIENTATION from 11/18/2017 in [facility]  Date  11/18/17  Referring Provider  [name] MD      Visit Diagnosis: 03/25/16 S/P MVR (mitral valve repair)  Stable angina ([place])  Patient's Home Medications on Admission:  Current Outpatient Medications:  .  amoxicillin (AMOXIL) 500 MG tablet, Take 4 tablets (2 g) by mouth 30-60 minutes prior to dental appointment (Patient taking differently: Take 2,000 mg by mouth See admin instructions. 2,000 mg 30-60 minutes prior to dental or medical procedures), Disp: 12 tablet, Rfl: 0 .  apixaban (ELIQUIS) 5 MG TABS tablet, Take 1 tablet (5 mg total) by mouth 2 (two) times daily., Disp: 180 tablet, Rfl: 3 .  Artificial Saliva (MOUTH KOTE MT), Use as directed 4 sprays in the mouth or throat every 3 (three) hours as needed (for dry mouth)., Disp: , Rfl:  .  carvedilol (COREG) 6.25 MG tablet, Take 1 tablet (6.25 mg total) by mouth 2 (two) times daily with a meal., Disp: 180 tablet, Rfl: 3 .  cetirizine (ZYRTEC) 10 MG tablet, Take 10 mg by mouth daily as needed (for allergies.). , Disp: , Rfl:  .  Cholecalciferol (VITAMIN D) 2000 units CAPS, Take 2,000 Units by mouth daily., Disp: , Rfl:  .  diphenhydrAMINE (BENADRYL) 25 MG tablet, Take 25 mg by mouth at bedtime as needed (for sleep.)., Disp: , Rfl:  .  fluticasone (FLONASE) 50 MCG/ACT nasal spray, Place 1 spray into both nostrils daily as needed for allergies or rhinitis., Disp: , Rfl:  .  furosemide (LASIX) 40 MG tablet, Take 2 tablets (80 mg total) by mouth daily., Disp: 180 tablet, Rfl: 3 .  HYDROcodone-acetaminophen  (NORCO) 10-325 MG tablet, Take 1 tablet by mouth every 4 (four) hours as needed for moderate pain., Disp: 20 tablet, Rfl: 0 .  Hypromellose (ARTIFICIAL TEARS OP), Apply 1 drop to eye 2 (two) times daily as needed (dry eyes)., Disp: , Rfl:  .  ibuprofen (ADVIL,MOTRIN) 200 MG tablet, Take 400-800 mg by mouth every 8 (eight) hours as needed (for knee/back pain or arthritis pain.)., Disp: , Rfl:  .  metFORMIN (GLUCOPHAGE) 500 MG tablet, Take 500 mg by mouth 2 (two) times daily with a meal., Disp: , Rfl:  .  nitroGLYCERIN (NITROSTAT) 0.4 MG SL tablet, DISSOLVE ONE TABLET UNDER THE TONGUE EVERY 5 MINUTES AS NEEDED FOR CHEST PAIN.  DO NOT EXCEED A TOTAL OF 3 DOSES IN 15 MINUTES, Disp: 25 tablet, Rfl: 3 .  PARoxetine (PAXIL) 40 MG tablet, Take 20 mg by mouth daily as needed (PTSD symptoms)., Disp: , Rfl:  .  potassium chloride SA (K-DUR,KLOR-CON) 20 MEQ tablet, Take 20 mEq by mouth 2 (two) times daily., Disp: , Rfl:  .  ranolazine (RANEXA) 500 MG 12 hr tablet, Take 1 tablet (500 mg total) by mouth 2 (two) times daily., Disp: 180 tablet, Rfl: 3 .  tadalafil (CIALIS) 20 MG tablet, Take 20 mg by mouth daily as needed for erectile dysfunction., Disp: , Rfl:  .  testosterone cypionate (DEPOTESTOSTERONE CYPIONATE) 200 MG/ML injection, Inject 200 mg into the muscle once a week., Disp: , Rfl:  .  traZODone (DESYREL) 150 MG tablet, Take 150 mg by mouth at bedtime. , Disp: , Rfl:   Past Medical History: Past Medical History:  Diagnosis Date  . Allergic rhinitis   . Anxiety   . Cancer ([place])    hx of skin cancers   . Coronary artery disease    a. s/p MI & CABG; b. s/p PCI Diag;  c. Cath 12/2011: LAD diff dzs/small, Diag 75% isr, 90 dist to stent (small), LCX & RCA occluded, VG->OM 40, VG->PDA patent - med rx.  . Depression   . Diabetes mellitus    ORAL MEDS  . Diastolic CHF, chronic (HCC)    a. EF 55-60% by echo 2007  . Dysrhythmia    "abnormal beat"  . GERD (gastroesophageal reflux disease)    "not anymore"  " I had a lap band"  . History of diverticulitis of colon    5 YRS AGO  . History of pneumonia    2017  . History of transient ischemic attack (TIA)    CAROTID DOPPLERS NOV 2011  0-39& BIL. STENOSIS  . Hyperlipidemia   . Hypertension   . Mitral regurgitation   . Myocardial infarction ([place])   . Neuromuscular disorder ([place])    left arm numbness   . 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 PTSD (post-traumatic stress disorder)    from [place]  . 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 Shortness of breath dyspnea    with exertion  . Sleep apnea    cpap- see ov note in [facility] 05/12/11 for settings     Tobacco Use: Social History   Tobacco Use  Smoking Status Never Smoker  Smokeless Tobacco Never Used    Labs: Recent Review Flowsheet Data    Labs for ITP Cardiac and Pulmonary Rehab Latest Ref Rng & Units 03/26/2016 08/07/2016 10/14/2016 10/06/2017 12/09/2017   Cholestrol 0 - 200 mg/dL - 159 136 188 151   LDLCALC 0 - 99 mg/dL - 60 76 92 75   HDL >40 mg/dL - 30(L) 36(L) 49 38(L)   Trlycerides <150 mg/dL - 345(H) 122 237(H) 188(H)   Hemoglobin A1c 4.8 - 5.6 % - - - - -   PHART 7.350 - 7.450 - - - - -   PCO2ART 35.0 - 45.0 mmHg - - - - -   HCO3 20.0 - 24.0 mEq/L - - - - -   TCO2 0 - 100 mmol/L 22 - - - -   ACIDBASEDEF 0.0 - 2.0 mmol/L - - - - -   O2SAT % - - - - -      Capillary Blood Glucose: Lab Results  Component Value Date   GLUCAP 144 (H) 12/22/2017   GLUCAP 157 (H) 11/24/2017   GLUCAP 144 (H) 11/24/2017   GLUCAP 168 (H) 11/22/2017   GLUCAP 135 (H) 11/22/2017     Exercise Target Goals:    Exercise Program Goal: Individual exercise prescription set using results from initial 6 min walk test and THRR while considering   patient's activity barriers and safety.   Exercise Prescription Goal: Initial exercise prescription builds to 30-45 minutes a day of aerobic activity, 2-3 days per week.  Home exercise guidelines will be given to patient during program as part of exercise prescription  that the participant will acknowledge.  Activity Barriers & Risk Stratification: Activity Barriers & Cardiac Risk Stratification - 11/18/17 0920    Activity Barriers & Cardiac Risk Stratification          Activity Barriers  Right Hip Replacement;Other (comment);Back Problems;Deconditioning;Muscular Weakness;Arthritis    Comments  R toe discomfort    Cardiac Risk Stratification  High           6 Minute Walk: 6 Minute Walk    6 Minute Walk    Row Name 11/18/17 1039   Phase  Initial   Distance  1723 feet   Distance % Change  0 %   Distance Feet Change  0 ft   Walk Time  6 minutes   # of Rest Breaks  0   MPH  3.26   METS  3.31   RPE  13   Perceived Dyspnea   0   VO2 Peak  11.58   Symptoms  Yes (comment)   Comments  Right Toe Discomfort +5/10   Resting HR  64 bpm   Resting BP  118/62   Resting Oxygen Saturation   97 %   Exercise Oxygen Saturation  during 6 min walk  100 %   Max Ex. HR  100 bpm   Max Ex. BP  128/70   2 Minute Post BP  118/70          Oxygen Initial Assessment:   Oxygen Re-Evaluation:   Oxygen Discharge (Final Oxygen Re-Evaluation):   Initial Exercise Prescription: Initial Exercise Prescription - 11/18/17 1100    Date of Initial Exercise RX and Referring Provider          Date  11/18/17    Referring Provider  [name] MD        Treadmill          MPH  2.8    Grade  0    Minutes  10    METs  3.14        Bike          Level  1    Minutes  10    METs  2.99        NuStep          Level  4    SPM  70    Minutes  10    METs  2.6        Prescription Details          Frequency (times per week)  3x    Duration  Progress to 30 minutes of continuous aerobic  without signs/symptoms of physical distress        Intensity          THRR 40-80% of Max Heartrate  59-118    Ratings of Perceived Exertion  11-15    Perceived Dyspnea  0-4        Progression          Progression  Continue progressive overload as per policy without signs/symptoms or physical distress.        Resistance Training          Training Prescription  Yes    Weight  3lbs    Reps  10-15           Perform Capillary Blood Glucose checks as needed.  Exercise Prescription Changes: Exercise Prescription Changes    Response to Exercise    Row Name 11/22/17 1600 12/08/17 0816 12/22/17 1100 01/03/18 1632  Blood Pressure (Admit)  106/72  114/68  122/70  108/60   Blood Pressure (Exercise)  128/70  126/70  120/64  120/70   Blood Pressure (Exit)  100/70  104/62  118/78  98/60   Heart Rate (Admit)  72 bpm  67 bpm  95 bpm  89 bpm   Heart Rate (Exercise)  89 bpm  87 bpm  109 bpm  100 bpm   Heart Rate (Exit)  83 bpm  77 bpm  81 bpm  81 bpm   Rating of Perceived Exertion (Exercise)  12  11  10  11    Perceived Dyspnea (Exercise)  no documentation  0  0  0   Symptoms  None  None  None  None   Comments  Pt oriented to exercise equipment  no documentation  no documentation  no documentation   Duration  Progress to 30 minutes of  aerobic without signs/symptoms of physical distress  Continue with 30 min of aerobic exercise without signs/symptoms of physical distress.  Continue with 30 min of aerobic exercise without signs/symptoms of physical distress.  Continue with 30 min of aerobic exercise without signs/symptoms of physical distress.   Intensity  THRR New  THRR unchanged  THRR unchanged  THRR unchanged       Progression    Row Name 11/22/17 1600 12/08/17 0816 12/22/17 1100 01/03/18 1632   Progression  Continue to progress workloads to maintain intensity without signs/symptoms of physical distress.  Continue to progress workloads to maintain intensity without signs/symptoms of  physical distress.  Continue to progress workloads to maintain intensity without signs/symptoms of physical distress.  Continue to progress workloads to maintain intensity without signs/symptoms of physical distress.   Average METs  2.9  3.53  4  3.8       Resistance Training    Row Name 11/22/17 1600 12/08/17 0816 12/22/17 1100 01/03/18 1632   Training Prescription  Yes  Yes  No relaxation day  no documentation   Weight  3lbs  6lbs  no documentation  8lbs   Reps  10-15  10-15  no documentation  10-15   Time  10 Minutes  10 Minutes  10 Minutes  10 Minutes       Interval Training    Row Name 11/22/17 1600 12/08/17 0816 12/22/17 1100 01/03/18 1632   Interval Training  no documentation  No  No  No       Treadmill    Row Name 11/22/17 1600 12/08/17 0816 12/22/17 1100 01/03/18 1632   MPH  2.8  2.8  2.8  3   Grade  0  3  3  3    Minutes  10  10  10  10    METs  3.14  4.3  4.3  4.54       [place] Name 11/22/17 1600 12/08/17 0816 12/22/17 1100 01/03/18 1632   Level  1  1.3  1.3  1.3   Minutes  10  10  10  10    METs  2.96  3.6  3.6  3.59       NuStep    Row Name 11/22/17 1600 12/08/17 0816 12/22/17 1100 01/03/18 1632   Level  4  4  4  5    SPM  85  85  85  90   Minutes  10  10  10  10    METs  2.6  3  3   3.2  [place] Name 11/22/17 1600 12/08/17 0816 12/22/17 1100 01/03/18 1632   Plans to continue exercise at  no documentation  Forensic scientist (comment) Health visitor (comment) Health visitor (comment) [employer]   Frequency  no documentation  Add 3 additional days to program exercise sessions.  Add 3 additional days to program exercise sessions.  Add 3 additional days to program exercise sessions.   Initial Home Exercises Provided  no documentation  12/06/17  12/06/17  12/06/17          Exercise Comments: Exercise Comments    Row Name 11/22/17 1626 12/09/17 0819 12/09/17 0820 01/04/18  1445   Exercise Comments  Pt oriented to exericse equipment. Pt off to a great start. Will continue to monitor and follow up with pt.   Pt is doing great in cardiac rehab with exercise. Pt is responding well to workload increases. Will continue to monitor and progress pt.   Pt is doing great in cardiac rehab with exercise. Pt is responding well to workload increases. Reveiwed HEP with pt. Pt is currently exericses 3 days a week at local fitness center. Will continue to monitor and progress pt.   Reviewed METs and goals. Pt is tolerating exercise prescription very well. Pt is often limited due to SOB symptoms and overdoing activities. Will continue to monitor and progress as able.       Exercise Goals and Review: Exercise Goals    Exercise Goals    Row Name 11/18/17 0921   Increase Physical Activity  Yes   Intervention  Provide advice, education, support and counseling about physical activity/exercise needs.;Develop an individualized exercise prescription for aerobic and resistive training based on initial evaluation findings, risk stratification, comorbidities and participant's personal goals.   Expected Outcomes  Short Term: Attend rehab on a regular basis to increase amount of physical activity.;Long Term: Exercising regularly at least 3-5 days a week.;Long Term: Add in home exercise to make exercise part of routine and to increase amount of physical activity.   Increase Strength and Stamina  Yes Develop exercise routine to get back to gym. Also increase energy levels.   Intervention  Provide advice, education, support and counseling about physical activity/exercise needs.;Develop an individualized exercise prescription for aerobic and resistive training based on initial evaluation findings, risk stratification, comorbidities and participant's personal goals.   Expected Outcomes  Short Term: Increase workloads from initial exercise prescription for resistance, speed, and METs.;Short Term: Perform  resistance training exercises routinely during rehab and add in resistance training at home;Long Term: Improve cardiorespiratory fitness, muscular endurance and strength as measured by increased METs and functional capacity (6MWT)   Able to understand and use rate of perceived exertion (RPE) scale  Yes   Intervention  Provide education and explanation on how to use RPE scale   Expected Outcomes  Short Term: Able to use RPE daily in rehab to express subjective intensity level;Long Term:  Able to use RPE to guide intensity level when exercising independently   Knowledge and understanding of Target Heart Rate Range (THRR)  Yes   Intervention  Provide education and explanation of THRR including how the numbers were predicted and where they are located for reference   Expected Outcomes  Short Term: Able to state/look up THRR;Long Term: Able to use THRR to govern intensity when exercising independently   Able to check pulse independently  Yes   Intervention  Provide education and demonstration  on how to check pulse in carotid and radial arteries.;Review the importance of being able to check your own pulse for safety during independent exercise   Expected Outcomes  Short Term: Able to explain why pulse checking is important during independent exercise;Long Term: Able to check pulse independently and accurately   Understanding of Exercise Prescription  Yes   Intervention  Provide education, explanation, and written materials on patient's individual exercise prescription   Expected Outcomes  Short Term: Able to explain program exercise prescription;Long Term: Able to explain home exercise prescription to exercise independently          Exercise Goals Re-Evaluation : Exercise Goals Re-Evaluation    Exercise Goal Re-Evaluation    Row Name 12/09/17 0820 12/09/17 0821 01/04/18 1449   Exercise Goals Review  Increase Physical Activity  Increase Physical Activity;Increase Strength and Stamina;Able to  understand and use rate of perceived exertion (RPE) scale;Knowledge and understanding of Target Heart Rate Range (THRR);Able to check pulse independently;Understanding of Exercise Prescription  Increase Physical Activity;Increase Strength and Stamina;Able to understand and use rate of perceived exertion (RPE) scale;Knowledge and understanding of Target Heart Rate Range (THRR);Able to check pulse independently;Understanding of Exercise Prescription   Comments  no documentation  Reviewed Home Exercise Program with pt. Also reviewed RPE Scale, THRR, NTG use, weather conditions, endpoints of exercise. Pt is responding great to exercise and is continuing to progress workloads.   Pt is steady making progress in activity levels. Pt is active with HEP but needs reminding of activity limitations. Pt has a tendency to overdo it; further discussed activity limitations and emergency precautions. Pt voiced understanding.    Expected Outcomes  no documentation  Pt will continue to exercise 3 additional days in addition to exercise at cardiac rehab. Pt will continue to increase funtional capacity. Will continue to monitor and progress pt as tolerated.   Pt will continue to improve in cardiorespiratory fitness, adhere to activity limitations and be compliant with HEP.            Discharge Exercise Prescription (Final Exercise Prescription Changes): Exercise Prescription Changes - 01/03/18 1632    Response to Exercise          Blood Pressure (Admit)  108/60    Blood Pressure (Exercise)  120/70    Blood Pressure (Exit)  98/60    Heart Rate (Admit)  89 bpm    Heart Rate (Exercise)  100 bpm    Heart Rate (Exit)  81 bpm    Rating of Perceived Exertion (Exercise)  11    Perceived Dyspnea (Exercise)  0    Symptoms  None    Duration  Continue with 30 min of aerobic exercise without signs/symptoms of physical distress.    Intensity  THRR unchanged        Progression          Progression  Continue to progress  workloads to maintain intensity without signs/symptoms of physical distress.    Average METs  3.8        Resistance Training          Weight  8lbs    Reps  10-15    Time  10 Minutes        Interval Training          Interval Training  No        Treadmill          MPH  3    Grade  3    Minutes  10  METs  4.54        Bike          Level  1.3    Minutes  10    METs  3.59        NuStep          Level  5    SPM  90    Minutes  10    METs  3.2        Home Exercise Plan          Plans to continue exercise at  [employer] (comment) [employer]    Frequency  Add 3 additional days to program exercise sessions.    Initial Home Exercises Provided  12/06/17           Nutrition:  Target Goals: Understanding of nutrition guidelines, daily intake of sodium 1500mg , cholesterol 200mg , calories 30% from fat and 7% or less from saturated fats, daily to have 5 or more servings of fruits and vegetables.  Biometrics: Pre Biometrics - 11/18/17 1046    Pre Biometrics          Height  5\' 9"  (1.753 m)    Weight  211 lb 13.8 oz (96.1 kg)    Waist Circumference  43.5 inches    Hip Circumference  42 inches    Waist to Hip Ratio  1.04 %    BMI (Calculated)  31.27    Triceps Skinfold  10 mm    % Body Fat  28.9 %    Grip Strength  46 kg    Flexibility  9.5 in    Single Leg Stand  5 seconds            Nutrition Therapy Plan and Nutrition Goals: Nutrition Therapy & Goals - 11/18/17 1123    Nutrition Therapy          Diet  Carb Modified, Therapeutic Lifestyle Changes        Personal Nutrition Goals          Nutrition Goal  1-2 lb wt loss/week to a wt loss goal of 6-24 lb at graduation from Cardiac Rehab    Personal Goal #2  Improved glycemic control as evidenced by an A1c trending from 7.2 toward less than 7        Intervention Plan          Intervention  Prescribe, educate and counsel regarding individualized specific dietary modifications  aiming towards targeted core components such as weight, hypertension, lipid management, diabetes, heart failure and other comorbidities.    Expected Outcomes  Short Term Goal: Understand basic principles of dietary content, such as calories, fat, sodium, cholesterol and nutrients.;Long Term Goal: Adherence to prescribed nutrition plan.           Nutrition Assessments: Nutrition Assessments - 11/18/17 1124    MEDFICTS Scores          Pre Score  17           Nutrition Goals Re-Evaluation:   Nutrition Goals Re-Evaluation:   Nutrition Goals Discharge (Final Nutrition Goals Re-Evaluation):   Psychosocial: Target Goals: Acknowledge presence or absence of significant depression and/or stress, maximize coping skills, provide positive support system. Participant is able to verbalize types and ability to use techniques and skills needed for reducing stress and depression.  Initial Review & Psychosocial Screening: Initial Psych Review & Screening - 11/18/17 1150    Initial Review          Current issues  with  None Identified        Family Dynamics          Good Support System?  Yes Pt reports that he has supportive wife, family, and friends.         Barriers          Psychosocial barriers to participate in program  There are no identifiable barriers or psychosocial needs.        Screening Interventions          Interventions  Encouraged to exercise    Expected Outcomes  Short Term goal: Identification and review with participant of any Quality of Life or Depression concerns found by scoring the questionnaire.;Long Term goal: The participant improves quality of Life and PHQ9 Scores as seen by post scores and/or verbalization of changes           Quality of Life Scores: Quality of Life - 11/18/17 1047    Quality of Life Scores          Health/Function Pre  20.23 %    Socioeconomic Pre  29.25 %    Psych/Spiritual Pre  30 %    Family Pre  19.2 %    GLOBAL Pre  24.1 %           Scores of 19 and below usually indicate a poorer quality of life in these areas.  A difference of  2-3 points is a clinically meaningful difference.  A difference of 2-3 points in the total score of the Quality of Life Index has been associated with significant improvement in overall quality of life, self-image, physical symptoms, and general health in studies assessing change in quality of life.  PHQ-9: Recent Review Flowsheet Data    Depression screen [facility] 2/9 11/22/2017 08/21/2016 05/11/2016 02/14/2014   Decreased Interest 0 0 0 0   Down, Depressed, Hopeless 0 0 0 0   PHQ - 2 Score 0 0 0 0     Interpretation of Total Score  Total Score Depression Severity:  1-4 = Minimal depression, 5-9 = Mild depression, 10-14 = Moderate depression, 15-19 = Moderately severe depression, 20-27 = Severe depression   Psychosocial Evaluation and Intervention: Psychosocial Evaluation - 12/08/17 1149    Psychosocial Evaluation & Interventions          Interventions  Encouraged to exercise with the program and follow exercise prescription    Comments  no psychosocial needs identified, no interventions necessary     Expected Outcomes  pt will exhibit positive outlook with good coping skills.     Continue Psychosocial Services   No Follow up required           Psychosocial Re-Evaluation: Psychosocial Re-Evaluation    Psychosocial Re-Evaluation    [place] Name 12/09/17 1348 12/31/17 1155   Current issues with  None Identified  None Identified   Comments  no psychosocial needs identified, no interventions necessary   no psychosocial needs identified, no interventions necessary    Expected Outcomes  pt will exhibit positive outlook with good coping skills.   pt will exhibit positive outlook with good coping skills.    Interventions  Encouraged to attend Cardiac Rehabilitation for the exercise  Encouraged to attend Cardiac Rehabilitation for the exercise   Continue Psychosocial Services   No Follow up  required  No Follow up required          Psychosocial Discharge (Final Psychosocial Re-Evaluation): Psychosocial Re-Evaluation - 12/31/17 1155    Psychosocial Re-Evaluation  Current issues with  None Identified    Comments  no psychosocial needs identified, no interventions necessary     Expected Outcomes  pt will exhibit positive outlook with good coping skills.     Interventions  Encouraged to attend Cardiac Rehabilitation for the exercise    Continue Psychosocial Services   No Follow up required           Vocational Rehabilitation: Provide vocational rehab assistance to qualifying candidates.   Vocational Rehab Evaluation & Intervention: Vocational Rehab - 11/18/17 1147    Initial Vocational Rehab Evaluation & Intervention          Assessment shows need for Vocational Rehabilitation  No           Education: Education Goals: Education classes will be provided on a weekly basis, covering required topics. Participant will state understanding/return demonstration of topics presented.  Learning Barriers/Preferences: Learning Barriers/Preferences - 11/18/17 8921    Learning Barriers/Preferences          Learning Barriers  None    Learning Preferences  Skilled Demonstration           Education Topics: Count Your Pulse:  -Group instruction provided by verbal instruction, demonstration, patient participation and written materials to support subject.  Instructors address importance of being able to find your pulse and how to count your pulse when at home without a heart monitor.  Patients get hands on experience counting their pulse with staff help and individually. Flowsheet Row CARDIAC REHAB PHASE II EXERCISE from 01/05/2018 in [place]  Date  12/03/17  Instruction Review Code  1- Verbalizes Understanding      Heart Attack, Angina, and Risk Factor Modification:  -Group instruction provided by verbal instruction, video, and  written materials to support subject.  Instructors address signs and symptoms of angina and heart attacks.    Also discuss risk factors for heart disease and how to make changes to improve heart health risk factors. Flowsheet Row CARDIAC REHAB PHASE II EXERCISE from 01/05/2018 in [place]  Date  12/15/17  Instruction Review Code  2- Demonstrated Understanding      Functional Fitness:  -Group instruction provided by verbal instruction, demonstration, patient participation, and written materials to support subject.  Instructors address safety measures for doing things around the house.  Discuss how to get up and down off the floor, how to pick things up properly, how to safely get out of a chair without assistance, and balance training. Flowsheet Row CARDIAC REHAB PHASE II EXERCISE from 01/05/2018 in [place]  Date  12/17/17  Instruction Review Code  1- Verbalizes Understanding      Meditation and Mindfulness:  -Group instruction provided by verbal instruction, patient participation, and written materials to support subject.  Instructor addresses importance of mindfulness and meditation practice to help reduce stress and improve awareness.  Instructor also leads participants through a meditation exercise.  Flowsheet Row CARDIAC REHAB PHASE II EXERCISE from 01/05/2018 in [place]  Date  01/05/18  Instruction Review Code  2- Demonstrated Understanding      Stretching for Flexibility and Mobility:  -Group instruction provided by verbal instruction, patient participation, and written materials to support subject.  Instructors lead participants through series of stretches that are designed to increase flexibility thus improving mobility.  These stretches are additional exercise for major muscle groups that are typically performed during regular warm up and cool down.  Hands Only CPR:  -Group  verbal, video, and participation provides a basic overview of AHA guidelines for community CPR. Role-play of emergencies allow participants the opportunity to practice calling for help and chest compression technique with discussion of AED use.   Hypertension: -Group verbal and written instruction that provides a basic overview of hypertension including the most recent diagnostic guidelines, risk factor reduction with self-care instructions and medication management. Flowsheet Row CARDIAC REHAB PHASE II EXERCISE from 01/05/2018 in [place]  Date  12/24/17  Instruction Review Code  2- Demonstrated Understanding       Nutrition I class: Heart Healthy Eating:  -Group instruction provided by PowerPoint slides, verbal discussion, and written materials to support subject matter. The instructor gives an explanation and review of the Therapeutic Lifestyle Changes diet recommendations, which includes a discussion on lipid goals, dietary fat, sodium, fiber, plant stanol/sterol esters, sugar, and the components of a well-balanced, healthy diet. Flowsheet Row CARDIAC REHAB PHASE II EXERCISE from 07/10/2016 in [place]  Date  05/22/16  Educator  RD  Instruction Review Code (Retired)  Not applicable [employer] handouts given]      Nutrition II class: Lifestyle Skills:  -Group instruction provided by [employer], verbal discussion, and written materials to support subject matter. The instructor gives an explanation and review of label reading, grocery shopping for heart health, heart healthy recipe modifications, and ways to make healthier choices when eating out. Flowsheet Row CARDIAC REHAB PHASE II EXERCISE from 07/10/2016 in [place]  Date  05/22/16  Educator  RD  Instruction Review Code (Retired)  Not applicable [employer] handouts given]      Diabetes Question & Answer:  -Group instruction provided  by PowerPoint slides, verbal discussion, and written materials to support subject matter. The instructor gives an explanation and review of diabetes co-morbidities, pre- and post-prandial blood glucose goals, pre-exercise blood glucose goals, signs, symptoms, and treatment of hypoglycemia and hyperglycemia, and foot care basics. Flowsheet Row CARDIAC REHAB PHASE II EXERCISE from 01/05/2018 in [place]  Date  12/31/17  Educator  RD  Instruction Review Code  2- Demonstrated Understanding      Diabetes Blitz:  -Group instruction provided by PowerPoint slides, verbal discussion, and written materials to support subject matter. The instructor gives an explanation and review of the physiology behind type 1 and type 2 diabetes, diabetes medications and rational behind using different medications, pre- and post-prandial blood glucose recommendations and Hemoglobin A1c goals, diabetes diet, and exercise including blood glucose guidelines for exercising safely.  Flowsheet Row CARDIAC REHAB PHASE II EXERCISE from 07/10/2016 in [place]  Date  05/22/16  Educator  RD  Instruction Review Code (Retired)  Not applicable [employer] handouts given]      Portion Distortion:  -Group instruction provided by [employer], verbal discussion, written materials, and food models to support subject matter. The instructor gives an explanation of serving size versus portion size, changes in portions sizes over the last 20 years, and what consists of a serving from each food group.   Stress Management:  -Group instruction provided by verbal instruction, video, and written materials to support subject matter.  Instructors review role of stress in heart disease and how to cope with stress positively.   Flowsheet Row CARDIAC REHAB PHASE II EXERCISE from 01/05/2018 in [place]  Date  11/24/17  Instruction Review Code  2-  Demonstrated Understanding      Exercising on Your Own:  -Group instruction provided by verbal instruction, power point, and written materials to support subject.  Instructors discuss benefits of exercise, components of exercise, frequency and intensity of exercise, and end points for exercise.  Also discuss use of nitroglycerin and activating EMS.  Review options of places to exercise outside of rehab.  Review guidelines for sex with heart disease. Flowsheet Row CARDIAC REHAB PHASE II EXERCISE from 01/05/2018 in [place]  Date  12/22/17  Educator  EP  Instruction Review Code  2- Demonstrated Understanding      Cardiac Drugs I:  -Group instruction provided by verbal instruction and written materials to support subject.  Instructor reviews cardiac drug classes: antiplatelets, anticoagulants, beta blockers, and statins.  Instructor discusses reasons, side effects, and lifestyle considerations for each drug class. Flowsheet Row CARDIAC REHAB PHASE II EXERCISE from 01/05/2018 in [place]  Date  12/29/17  Instruction Review Code  2- Demonstrated Understanding      Cardiac Drugs II:  -Group instruction provided by verbal instruction and written materials to support subject.  Instructor reviews cardiac drug classes: angiotensin converting enzyme inhibitors (ACE-I), angiotensin II receptor blockers (ARBs), nitrates, and calcium channel blockers.  Instructor discusses reasons, side effects, and lifestyle considerations for each drug class. Flowsheet Row CARDIAC REHAB PHASE II EXERCISE from 01/05/2018 in [place]  Date  12/01/17  Instruction Review Code  2- Demonstrated Understanding      Anatomy and Physiology of the Circulatory System:  Group verbal and written instruction and models provide basic cardiac anatomy and physiology, with the coronary electrical and arterial systems. Review of: AMI,  Angina, Valve disease, Heart Failure, Peripheral Artery Disease, Cardiac Arrhythmia, Pacemakers, and the ICD.   Other Education:  -Group or individual verbal, written, or video instructions that support the educational goals of the cardiac rehab program.   Holiday Eating Survival Tips:  -Group instruction provided by PowerPoint slides, verbal discussion, and written materials to support subject matter. The instructor gives patients tips, tricks, and techniques to help them not only survive but enjoy the holidays despite the onslaught of food that accompanies the holidays.   Knowledge Questionnaire Score: Knowledge Questionnaire Score - 11/18/17 1047    Knowledge Questionnaire Score          Pre Score  22/24           Core Components/Risk Factors/Patient Goals at Admission: Personal Goals and Risk Factors at Admission - 11/18/17 1038    Core Components/Risk Factors/Patient Goals on Admission           Weight Management  Yes;Weight Loss    Intervention  Weight Management: Develop a combined nutrition and exercise program designed to reach desired caloric intake, while maintaining appropriate intake of nutrient and fiber, sodium and fats, and appropriate energy expenditure required for the weight goal.;Weight Management: Provide education and appropriate resources to help participant work on and attain dietary goals.;Weight Management/Obesity: Establish reasonable short term and long term weight goals.;Obesity: Provide education and appropriate resources to help participant work on and attain dietary goals.    Admit Weight  211 lb (95.7 kg)    Goal Weight: Short Term  208 lb (94.3 kg)    Goal Weight: Long Term  199 lb (90.3 kg)    Expected Outcomes  Short Term: Continue to assess and modify interventions until short term weight is achieved;Long Term:  Adherence to nutrition and physical activity/exercise program aimed toward attainment of established weight goal;Weight Loss: Understanding  of general recommendations for a balanced deficit meal plan, which promotes 1-2 lb weight loss per week and includes a negative energy balance of [phone] kcal/d;Understanding recommendations for meals to include 15-35% energy as protein, 25-35% energy from fat, 35-60% energy from carbohydrates, less than 200mg  of dietary cholesterol, 20-35 gm of total fiber daily;Understanding of distribution of calorie intake throughout the day with the consumption of 4-5 meals/snacks    Diabetes  Yes    Intervention  Provide education about signs/symptoms and action to take for hypo/hyperglycemia.;Provide education about proper nutrition, including hydration, and aerobic/resistive exercise prescription along with prescribed medications to achieve blood glucose in normal ranges: Fasting glucose 65-99 mg/dL    Expected Outcomes  Short Term: Participant verbalizes understanding of the signs/symptoms and immediate care of hyper/hypoglycemia, proper foot care and importance of medication, aerobic/resistive exercise and nutrition plan for blood glucose control.;Long Term: Attainment of HbA1C < 7%.    Hypertension  Yes    Intervention  Provide education on lifestyle modifcations including regular physical activity/exercise, weight management, moderate sodium restriction and increased consumption of fresh fruit, vegetables, and low fat dairy, alcohol moderation, and smoking cessation.;Monitor prescription use compliance.    Expected Outcomes  Short Term: Continued assessment and intervention until BP is < 140/26mm HG in hypertensive participants. < 130/20mm HG in hypertensive participants with diabetes, heart failure or chronic kidney disease.;Long Term: Maintenance of blood pressure at goal levels.    Lipids  Yes    Intervention  Provide education and support for participant on nutrition & aerobic/resistive exercise along with prescribed medications to achieve LDL 70mg , HDL >40mg .    Expected Outcomes  Short Term: Participant  states understanding of desired cholesterol values and is compliant with medications prescribed. Participant is following exercise prescription and nutrition guidelines.;Long Term: Cholesterol controlled with medications as prescribed, with individualized exercise RX and with personalized nutrition plan. Value goals: LDL < 70mg , HDL > 40 mg.           Core Components/Risk Factors/Patient Goals Review:  Goals and Risk Factor Review    Core Components/Risk Factors/Patient Goals Review    Row Name 12/08/17 1147 12/17/17 0936 12/31/17 1153   Personal Goals Review  Other;Weight Management/Obesity;Diabetes;Hypertension;Lipids;Stress  no documentation  Other;Weight Management/Obesity;Diabetes;Hypertension;Lipids   Review  pt with multiiple CAD RF demonstrates eagerness to particiate in CR program at this time.   pt concerned about continued fatigue and decreased stamina.  pt plans to hold statin for 5 days per nephrologist direction.  pt would like to resume testoserone.    pt concerned about continued fatigue and decreased stamina.  pt reports improved muscle aches with holding statin.  pt interested in [facility].   pt advised appropriate to resume testoterone treatments per Dr. Aundra Dubin.  pt discouraged with persistent fatigue worse with high humidity,  however hopeful his efforts will improve strength/stamina.   Expected Outcomes  pt will participatein CR exercise, nutrition and lifestyle modification education to decrease overall RF.   pt will participatein CR exercise, nutrition and lifestyle modification education to decrease overall RF.   pt will participatein CR exercise, nutrition and lifestyle modification education to decrease overall RF.           Core Components/Risk Factors/Patient Goals at Discharge (Final Review):  Goals and Risk Factor Review - 12/31/17 1153    Core Components/Risk Factors/Patient Goals Review  Personal Goals Review  Other;Weight  Management/Obesity;Diabetes;Hypertension;Lipids    Review  pt concerned about continued fatigue and decreased stamina.  pt reports improved muscle aches with holding statin.  pt interested in [facility].   pt advised appropriate to resume testoterone treatments per Dr. Aundra Dubin.  pt discouraged with persistent fatigue worse with high humidity,  however hopeful his efforts will improve strength/stamina.    Expected Outcomes  pt will participatein CR exercise, nutrition and lifestyle modification education to decrease overall RF.            ITP Comments: ITP Comments    Row Name 11/18/17 0919 12/08/17 1145 12/31/17 1152 01/03/18 1702   ITP Comments  Dr. Fransico Him, Medical Director  30 day ITP review. pt rcently transferred to 8:15 group exercise class. pt demonstrates eagerness to participate in program at this time.   30 day ITP review. pt tolerating group exercise without difficulty.  pt demonstrates eagerness to participate in program at this time.   30 day ITP review. pt tolerating group exercise without difficulty.  pt demonstrates eagerness to participate in program at this time.       Comments:

## 2018-01-07 ENCOUNTER — Encounter (HOSPITAL_COMMUNITY): Payer: Medicare Other

## 2018-01-07 ENCOUNTER — Encounter (HOSPITAL_COMMUNITY)
Admission: RE | Admit: 2018-01-07 | Discharge: 2018-01-07 | Disposition: A | Discharge status: Home / Self Care | Payer: Medicare Other | Source: Ambulatory Visit | Attending: Cardiology | Admitting: Cardiology

## 2018-01-07 ENCOUNTER — Ambulatory Visit (HOSPITAL_COMMUNITY): Payer: Medicare Other

## 2018-01-10 ENCOUNTER — Encounter (HOSPITAL_COMMUNITY): Payer: Medicare Other

## 2018-01-10 ENCOUNTER — Ambulatory Visit (HOSPITAL_COMMUNITY): Payer: Medicare Other

## 2018-01-12 ENCOUNTER — Encounter (HOSPITAL_COMMUNITY)
Admission: RE | Admit: 2018-01-12 | Discharge: 2018-01-12 | Disposition: A | Discharge status: Home / Self Care | Payer: Medicare Other | Source: Ambulatory Visit | Attending: Cardiology | Admitting: Cardiology

## 2018-01-12 ENCOUNTER — Ambulatory Visit (HOSPITAL_COMMUNITY): Payer: Medicare Other

## 2018-01-12 ENCOUNTER — Encounter (HOSPITAL_COMMUNITY): Payer: Medicare Other

## 2018-01-12 DIAGNOSIS — Z79899 Other long term (current) drug therapy: Secondary | ICD-10-CM | POA: Diagnosis not present

## 2018-01-12 DIAGNOSIS — I25118 Atherosclerotic heart disease of native coronary artery with other forms of angina pectoris: Secondary | ICD-10-CM | POA: Diagnosis not present

## 2018-01-12 DIAGNOSIS — Z9889 Other specified postprocedural states: Secondary | ICD-10-CM

## 2018-01-12 DIAGNOSIS — I208 Other forms of angina pectoris: Secondary | ICD-10-CM

## 2018-01-12 DIAGNOSIS — I509 Heart failure, unspecified: Secondary | ICD-10-CM | POA: Diagnosis not present

## 2018-01-14 ENCOUNTER — Encounter (HOSPITAL_COMMUNITY)
Admission: RE | Admit: 2018-01-14 | Discharge: 2018-01-14 | Disposition: A | Discharge status: Home / Self Care | Payer: Medicare Other | Source: Ambulatory Visit | Attending: Cardiology | Admitting: Cardiology

## 2018-01-14 ENCOUNTER — Ambulatory Visit (HOSPITAL_COMMUNITY): Payer: Medicare Other

## 2018-01-14 ENCOUNTER — Encounter (HOSPITAL_COMMUNITY): Payer: Medicare Other

## 2018-01-14 DIAGNOSIS — I25118 Atherosclerotic heart disease of native coronary artery with other forms of angina pectoris: Secondary | ICD-10-CM | POA: Diagnosis not present

## 2018-01-14 DIAGNOSIS — Z79899 Other long term (current) drug therapy: Secondary | ICD-10-CM | POA: Diagnosis not present

## 2018-01-14 DIAGNOSIS — Z9889 Other specified postprocedural states: Secondary | ICD-10-CM

## 2018-01-14 DIAGNOSIS — I509 Heart failure, unspecified: Secondary | ICD-10-CM | POA: Diagnosis not present

## 2018-01-14 DIAGNOSIS — I208 Other forms of angina pectoris: Secondary | ICD-10-CM

## 2018-01-19 ENCOUNTER — Encounter (HOSPITAL_COMMUNITY)
Admission: RE | Admit: 2018-01-19 | Discharge: 2018-01-19 | Disposition: A | Discharge status: Home / Self Care | Payer: Medicare Other | Source: Ambulatory Visit | Attending: Cardiology | Admitting: Cardiology

## 2018-01-19 ENCOUNTER — Ambulatory Visit (HOSPITAL_COMMUNITY): Payer: Medicare Other

## 2018-01-19 ENCOUNTER — Encounter (HOSPITAL_COMMUNITY): Payer: Medicare Other

## 2018-01-19 VITALS — Wt 218.5 lb

## 2018-01-19 DIAGNOSIS — I208 Other forms of angina pectoris: Secondary | ICD-10-CM

## 2018-01-19 DIAGNOSIS — I509 Heart failure, unspecified: Secondary | ICD-10-CM | POA: Diagnosis not present

## 2018-01-19 DIAGNOSIS — Z79899 Other long term (current) drug therapy: Secondary | ICD-10-CM | POA: Diagnosis not present

## 2018-01-19 DIAGNOSIS — Z9889 Other specified postprocedural states: Secondary | ICD-10-CM

## 2018-01-19 DIAGNOSIS — I25118 Atherosclerotic heart disease of native coronary artery with other forms of angina pectoris: Secondary | ICD-10-CM | POA: Diagnosis not present

## 2018-01-20 ENCOUNTER — Telehealth (HOSPITAL_COMMUNITY): Payer: Self-pay | Admitting: *Deleted

## 2018-01-20 ENCOUNTER — Encounter (HOSPITAL_COMMUNITY): Payer: Self-pay

## 2018-01-20 ENCOUNTER — Ambulatory Visit (HOSPITAL_COMMUNITY)
Admission: RE | Admit: 2018-01-20 | Discharge: 2018-01-20 | Disposition: A | Discharge status: Home / Self Care | Payer: Medicare Other | Source: Ambulatory Visit | Attending: Cardiology | Admitting: Cardiology

## 2018-01-20 VITALS — HR 74 | Wt 215.4 lb

## 2018-01-20 DIAGNOSIS — I509 Heart failure, unspecified: Secondary | ICD-10-CM | POA: Insufficient documentation

## 2018-01-20 DIAGNOSIS — R5383 Other fatigue: Secondary | ICD-10-CM | POA: Diagnosis not present

## 2018-01-20 DIAGNOSIS — R0602 Shortness of breath: Secondary | ICD-10-CM | POA: Insufficient documentation

## 2018-01-20 LAB — BASIC METABOLIC PANEL
ANION GAP: 10 (ref 5–15)
BUN: 22 mg/dL — AB (ref 6–20)
CO2: 25 mmol/L (ref 22–32)
Calcium: 9.4 mg/dL (ref 8.9–10.3)
Chloride: 103 mmol/L (ref 101–111)
Creatinine, Ser: 3.35 mg/dL — ABNORMAL HIGH (ref 0.61–1.24)
GFR, EST AFRICAN AMERICAN: 20 mL/min — AB (ref >60)
GFR, EST NON AFRICAN AMERICAN: 17 mL/min — AB (ref >60)
Glucose, Bld: 107 mg/dL — ABNORMAL HIGH (ref 65–99)
POTASSIUM: 4.3 mmol/L (ref 3.5–5.1)
SODIUM: 138 mmol/L (ref 135–145)

## 2018-01-20 LAB — CBC
HEMATOCRIT: 32.3 % — AB (ref 39.0–52.0)
Hemoglobin: 10.7 g/dL — ABNORMAL LOW (ref 13.0–17.0)
MCH: 31.8 pg (ref 26.0–34.0)
MCHC: 33.1 g/dL (ref 30.0–36.0)
MCV: 96.1 fL (ref 78.0–100.0)
Platelets: 236 10*3/uL (ref 150–400)
RBC: 3.36 MIL/uL — ABNORMAL LOW (ref 4.22–5.81)
RDW: 13.7 % (ref 11.5–15.5)
WBC: 6.1 10*3/uL (ref 4.0–10.5)

## 2018-01-20 NOTE — Telephone Encounter (Signed)
Come in for nursing visit for BMET, CBC, and ECG.  Needs followup in the clinic soon, next available APP appt.

## 2018-01-20 NOTE — Telephone Encounter (Signed)
Pt added to nurse schedule today. Will schedule APP appt during nurse visit.

## 2018-01-20 NOTE — Progress Notes (Signed)
Pt seen today for EKG and lab work. PT called earlier  c/o no energy, more fatigue, and increased shortness of breath with exertion. Patients weight is stable at 213lbs no edema. No energy after cardiac rehab. Pt wants to know if we can cut back on meds or if he needs a visit. Routed to [place] for advice. Per Dr.McLean come in for nursing visit for BMET, CBC, and ECG.  Needs followup in the clinic soon, next available APP appt. ECG normal sinus rhythm. Patient will call to schedule f/u visit he had a family emergency. We will call patients once labs result.

## 2018-01-20 NOTE — Telephone Encounter (Signed)
Pt called c/o no energy, more fatigue, and increased shortness of breath with exertion. Patients weight is stable at 213lbs no edema. No energy after cardiac rehab. Pt wants to know if we can cut back on meds or if he needs a visit. Routed to [place] for advice.

## 2018-01-21 ENCOUNTER — Ambulatory Visit (HOSPITAL_COMMUNITY): Payer: Medicare Other

## 2018-01-21 ENCOUNTER — Encounter (HOSPITAL_COMMUNITY): Payer: Medicare Other

## 2018-01-24 ENCOUNTER — Ambulatory Visit (HOSPITAL_COMMUNITY): Payer: Medicare Other

## 2018-01-24 ENCOUNTER — Encounter (HOSPITAL_COMMUNITY): Payer: Medicare Other

## 2018-01-26 ENCOUNTER — Encounter (HOSPITAL_COMMUNITY): Payer: Medicare Other

## 2018-01-26 ENCOUNTER — Ambulatory Visit (HOSPITAL_COMMUNITY)
Admission: RE | Admit: 2018-01-26 | Discharge: 2018-01-26 | Disposition: A | Discharge status: Home / Self Care | Payer: Medicare Other | Source: Ambulatory Visit | Attending: Internal Medicine | Admitting: Internal Medicine

## 2018-01-26 ENCOUNTER — Telehealth (HOSPITAL_COMMUNITY): Payer: Self-pay

## 2018-01-26 ENCOUNTER — Ambulatory Visit (HOSPITAL_COMMUNITY): Payer: Medicare Other

## 2018-01-26 ENCOUNTER — Encounter (HOSPITAL_COMMUNITY)
Admission: RE | Admit: 2018-01-26 | Discharge: 2018-01-26 | Disposition: A | Discharge status: Home / Self Care | Payer: Medicare Other | Source: Ambulatory Visit | Attending: Cardiology | Admitting: Cardiology

## 2018-01-26 DIAGNOSIS — I25118 Atherosclerotic heart disease of native coronary artery with other forms of angina pectoris: Secondary | ICD-10-CM | POA: Insufficient documentation

## 2018-01-26 DIAGNOSIS — I509 Heart failure, unspecified: Secondary | ICD-10-CM | POA: Diagnosis not present

## 2018-01-26 DIAGNOSIS — N1330 Unspecified hydronephrosis: Secondary | ICD-10-CM

## 2018-01-26 DIAGNOSIS — Z79899 Other long term (current) drug therapy: Secondary | ICD-10-CM | POA: Insufficient documentation

## 2018-01-26 LAB — BASIC METABOLIC PANEL
ANION GAP: 5 (ref 5–15)
BUN: 24 mg/dL — ABNORMAL HIGH (ref 6–20)
CO2: 26 mmol/L (ref 22–32)
Calcium: 9.1 mg/dL (ref 8.9–10.3)
Chloride: 105 mmol/L (ref 101–111)
Creatinine, Ser: 3.26 mg/dL — ABNORMAL HIGH (ref 0.61–1.24)
GFR calc Af Amer: 20 mL/min — ABNORMAL LOW (ref >60)
GFR calc non Af Amer: 18 mL/min — ABNORMAL LOW (ref >60)
GLUCOSE: 116 mg/dL — AB (ref 65–99)
POTASSIUM: 5.5 mmol/L — AB (ref 3.5–5.1)
Sodium: 136 mmol/L (ref 135–145)

## 2018-01-26 NOTE — Telephone Encounter (Signed)
Notes recorded by Shirley Muscat, RN on 01/26/2018 at 4:29 PM EDT Pt aware of results, agreeable to med changes (changes made in [facility]), orders placed for ultrasound, and pt has his own ------  Notes recorded by [name], MD on 01/26/2018 at 2:03 PM EDT Stop K asap. Low K diet. Make sure he says off Lasix. Increase hydration. Needs renal ultrasound asap for hydronephrolsis. Needs appointment asap with CHF clinic and would refer to renal with AKI. Repeat BMET on Friday

## 2018-01-27 ENCOUNTER — Ambulatory Visit (INDEPENDENT_AMBULATORY_CARE_PROVIDER_SITE_OTHER): Payer: Medicare Other | Admitting: Pharmacist

## 2018-01-27 ENCOUNTER — Encounter (HOSPITAL_COMMUNITY): Payer: Self-pay

## 2018-01-27 ENCOUNTER — Telehealth (HOSPITAL_COMMUNITY): Payer: Self-pay

## 2018-01-27 ENCOUNTER — Ambulatory Visit (HOSPITAL_COMMUNITY)
Admission: RE | Admit: 2018-01-27 | Discharge: 2018-01-27 | Disposition: A | Discharge status: Home / Self Care | Payer: Medicare Other | Source: Ambulatory Visit | Attending: Internal Medicine | Admitting: Internal Medicine

## 2018-01-27 ENCOUNTER — Ambulatory Visit (HOSPITAL_COMMUNITY)
Admission: RE | Admit: 2018-01-27 | Discharge: 2018-01-27 | Disposition: A | Discharge status: Home / Self Care | Payer: Medicare Other | Source: Ambulatory Visit | Attending: Adult Health | Admitting: Adult Health

## 2018-01-27 ENCOUNTER — Other Ambulatory Visit: Payer: Self-pay

## 2018-01-27 ENCOUNTER — Telehealth (HOSPITAL_COMMUNITY): Payer: Self-pay | Admitting: *Deleted

## 2018-01-27 ENCOUNTER — Encounter: Payer: Self-pay | Admitting: Pharmacist

## 2018-01-27 VITALS — BP 130/72 | HR 67 | Wt 222.0 lb

## 2018-01-27 DIAGNOSIS — E1122 Type 2 diabetes mellitus with diabetic chronic kidney disease: Secondary | ICD-10-CM | POA: Diagnosis not present

## 2018-01-27 DIAGNOSIS — I480 Paroxysmal atrial fibrillation: Secondary | ICD-10-CM

## 2018-01-27 DIAGNOSIS — I130 Hypertensive heart and chronic kidney disease with heart failure and stage 1 through stage 4 chronic kidney disease, or unspecified chronic kidney disease: Secondary | ICD-10-CM | POA: Diagnosis not present

## 2018-01-27 DIAGNOSIS — Z9889 Other specified postprocedural states: Secondary | ICD-10-CM | POA: Diagnosis not present

## 2018-01-27 DIAGNOSIS — I340 Nonrheumatic mitral (valve) insufficiency: Secondary | ICD-10-CM | POA: Diagnosis not present

## 2018-01-27 DIAGNOSIS — E669 Obesity, unspecified: Secondary | ICD-10-CM | POA: Diagnosis not present

## 2018-01-27 DIAGNOSIS — Z823 Family history of stroke: Secondary | ICD-10-CM | POA: Insufficient documentation

## 2018-01-27 DIAGNOSIS — E875 Hyperkalemia: Secondary | ICD-10-CM | POA: Insufficient documentation

## 2018-01-27 DIAGNOSIS — N179 Acute kidney failure, unspecified: Secondary | ICD-10-CM

## 2018-01-27 DIAGNOSIS — Z8673 Personal history of transient ischemic attack (TIA), and cerebral infarction without residual deficits: Secondary | ICD-10-CM | POA: Insufficient documentation

## 2018-01-27 DIAGNOSIS — E7800 Pure hypercholesterolemia, unspecified: Secondary | ICD-10-CM

## 2018-01-27 DIAGNOSIS — E785 Hyperlipidemia, unspecified: Secondary | ICD-10-CM | POA: Diagnosis not present

## 2018-01-27 DIAGNOSIS — Z951 Presence of aortocoronary bypass graft: Secondary | ICD-10-CM | POA: Insufficient documentation

## 2018-01-27 DIAGNOSIS — I5032 Chronic diastolic (congestive) heart failure: Secondary | ICD-10-CM | POA: Diagnosis not present

## 2018-01-27 DIAGNOSIS — Z7984 Long term (current) use of oral hypoglycemic drugs: Secondary | ICD-10-CM | POA: Insufficient documentation

## 2018-01-27 DIAGNOSIS — N400 Enlarged prostate without lower urinary tract symptoms: Secondary | ICD-10-CM | POA: Diagnosis not present

## 2018-01-27 DIAGNOSIS — I25810 Atherosclerosis of coronary artery bypass graft(s) without angina pectoris: Secondary | ICD-10-CM | POA: Diagnosis not present

## 2018-01-27 DIAGNOSIS — N183 Chronic kidney disease, stage 3 (moderate): Secondary | ICD-10-CM | POA: Diagnosis not present

## 2018-01-27 DIAGNOSIS — Z79899 Other long term (current) drug therapy: Secondary | ICD-10-CM | POA: Diagnosis not present

## 2018-01-27 DIAGNOSIS — G4733 Obstructive sleep apnea (adult) (pediatric): Secondary | ICD-10-CM | POA: Insufficient documentation

## 2018-01-27 DIAGNOSIS — Z7901 Long term (current) use of anticoagulants: Secondary | ICD-10-CM | POA: Insufficient documentation

## 2018-01-27 DIAGNOSIS — I2582 Chronic total occlusion of coronary artery: Secondary | ICD-10-CM | POA: Diagnosis not present

## 2018-01-27 DIAGNOSIS — I2510 Atherosclerotic heart disease of native coronary artery without angina pectoris: Secondary | ICD-10-CM | POA: Diagnosis not present

## 2018-01-27 DIAGNOSIS — Z6832 Body mass index (BMI) 32.0-32.9, adult: Secondary | ICD-10-CM | POA: Diagnosis not present

## 2018-01-27 MED ORDER — ALIROCUMAB 150 MG/ML ~~LOC~~ SOPN: 150.0000 mg | PEN_INJECTOR | SUBCUTANEOUS | 11 refills | Status: DC

## 2018-01-27 NOTE — Assessment & Plan Note (Addendum)
LDL-c remains above goal for secondary prevention while on maximum tolerated statin dose ; therefore, is appropriate to initiate PCSK9i therapy. Current AKI will not contrainidicate PCSK9i therapy at this time. Patient stopped statin recently after increasing muscle weakness and pain.  Repatha/Praluent indication, mechanism of action, dosing, administration, storage, common side effect, and pre-approval process were discuss during OV. Patient requests print Rx to take to [facility] were he has a classification of 100% service connected and received medication free of charge.  Positive lifestyle modifications ,with emphasis on diet, were also discussed during this appointment.  Will provide printed Rx for Praluent 150mg  [place] every 14 days to patient. He is to call clinic once able to initiate therapy so we can schedule follow up blood work.

## 2018-01-27 NOTE — Telephone Encounter (Signed)
Recent lab results faxed to Dr. Jimmy Footman per patient request.  Renee Pain, RN

## 2018-01-27 NOTE — Patient Instructions (Signed)
STOP taking potassium  STOP taking Metformin.  Please call your PCP Today and let them know that due to Acute Kidney Injury we're discontinuing metformin.    Dr. Jimmy Footman will call you to schedule an appointment as soon as possible per Dr. Aundra Dubin.  Please call us if you haven't heard anything in the next couple days.   You have been scheduled for a Renal Ultrasound this afternoon at 2:45pm at [facility]. Please report to the main entrance and they will help you to Radiology.    Labs next week (bmet)  Follow up as scheduled with Dr. Aundra Dubin.

## 2018-01-27 NOTE — Telephone Encounter (Signed)
Result Notes for US Renal   Notes recorded by Darron Doom, RN on 01/27/2018 at 4:49 PM EDT Called and spoke with patient and he is aware of results. He stated he has an appointment at [facility] tomorrow morning at 8:00AM. I will fax results to their office. ------  Notes recorded by Conrad Cologne, NP on 01/27/2018 at 4:13 PM EDT Please call. Renal US ok. Normal kidney and bladder

## 2018-01-27 NOTE — Patient Instructions (Addendum)
[facility] (pharmacist)  *Will send prescription to [facility]* *Repeat fasting blood work 3 months after starting Repatha/Praluent*   Cholesterol Cholesterol is a fat. Your body needs a small amount of cholesterol. Cholesterol (plaque) may build up in your blood vessels (arteries). That makes you more likely to have a heart attack or stroke. You cannot feel your cholesterol level. Having a blood test is the only way to find out if your level is high. Keep your test results. Work with your doctor to keep your cholesterol at a good level. What do the results mean?  Total cholesterol is how much cholesterol is in your blood.  LDL is bad cholesterol. This is the type that can build up. Try to have low LDL.  HDL is good cholesterol. It cleans your blood vessels and carries LDL away. Try to have high HDL.  Triglycerides are fat that the body can store or burn for energy. What are good levels of cholesterol?  Total cholesterol below 200.  LDL below 100 is good for people who have health risks. LDL below 70 is good for people who have very high risks.  HDL above 40 is good. It is best to have HDL of 60 or higher.  Triglycerides below 150. How can I lower my cholesterol? Diet Follow your diet program as told by your doctor.  Choose fish, white meat chicken, or [place] that is roasted or baked. Try not to eat red meat, fried foods, sausage, or lunch meats.  Eat lots of fresh fruits and vegetables.  Choose whole grains, beans, pasta, potatoes, and cereals.  Choose olive oil, corn oil, or canola oil. Only use small amounts.  Try not to eat butter, mayonnaise, shortening, or palm kernel oils.  Try not to eat foods with trans fats.  Choose low-fat or nonfat dairy foods. ? Drink skim or nonfat milk. ? Eat low-fat or nonfat yogurt and cheeses. ? Try not to drink whole milk or cream. ? Try not to eat ice cream, egg yolks, or full-fat cheeses.  Healthy desserts include angel food cake,  ginger snaps, animal crackers, hard candy, popsicles, and low-fat or nonfat frozen yogurt. Try not to eat pastries, cakes, pies, and cookies.  Exercise Follow your exercise program as told by your doctor.  Be more active. Try gardening, walking, and taking the stairs.  Ask your doctor about ways that you can be more active.  Medicine  Take over-the-counter and prescription medicines only as told by your doctor. This information is not intended to replace advice given to you by your health care provider. Make sure you discuss any questions you have with your health care provider. Document Released: 11/06/2008 Document Revised: 03/11/2016 Document Reviewed: 02/20/2016 Elsevier Interactive Patient Education  [employer].

## 2018-01-27 NOTE — Progress Notes (Signed)
Patient ID: [name]                 DOB: Nov 03, 1945                    MRN: [id number]     HPI: SAHITH NURSE is a 72 y.o. male patient referred to lipid clinic by Dr. Aundra Dubin. PMH is significant for hypertension, CAD s/p MI and chronic stable angina, carotid stenosis, TIA, heart failure, DM, degenerative arthritis of knee, and obesity. Patient reports intolerance to multiple stations including atorvastatin, and pravastatin.  Presents to lipid clinic today accompany by his wife for potential initiation of PCSK9 inhibitor. Noted patient is currently under assessment for AKI and reports "feeling tired all the time".  Current Medications: none  Intolerances:  rosuvastatin 5mg  PTWSF(6812 & 2017) atorvastatin 10mg  daily (Sep/2013-Dec/2013) pravastatin 40mg  daily (Oct/2017 - Feb/2019) - lack clinical response pravastatin 80mg  daily (Feb/2019 - 12/2017) - muscle pain/difficulty walking  LDL goal: <70 mg/dL  Diet: Followed the Mediterranean diet for a while but him and his wife stopped the diet some time ago. Plan to resume mediterranean diet.  Exercise: activities of daily leaving. We discuss initiation of 15 minutes (non-stop) walks 3 times per week.  Family History: sister recently died of MI, reports hypertension in father, brother and sister; CVA/stroke in father and sister  Social History:  reports that  has never smoked. he has never used smokeless tobacco. He reports that he drinks alcohol. He reports that he does not use drugs  Labs: 12/09/2017: CHO 151; TG 188; HDL 38; LDL-c 75 (on pravastatin 80mg  daily) - stopped due to muscle pain  Past Medical History:  Diagnosis Date  . Allergic rhinitis   . Anxiety   . Cancer ([place])    hx of skin cancers   . Coronary artery disease    a. s/p MI & CABG; b. s/p PCI Diag;  c. Cath 12/2011: LAD diff dzs/small, Diag 75% isr, 90 dist to stent (small), LCX & RCA occluded, VG->OM 40, VG->PDA patent - med rx.  . Depression   . Diabetes mellitus      ORAL MEDS  . Diastolic CHF, chronic (HCC)    a. EF 55-60% by echo 2007  . Dysrhythmia    "abnormal beat"  . GERD (gastroesophageal reflux disease)    "not anymore" " I had a lap band"  . History of diverticulitis of colon    5 YRS AGO  . History of pneumonia    2017  . History of transient ischemic attack (TIA)    CAROTID DOPPLERS NOV 2011  0-39& BIL. STENOSIS  . Hyperlipidemia   . Hypertension   . Mitral regurgitation   . Myocardial infarction ([place])   . Neuromuscular disorder ([place])    left arm numbness   . 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 PTSD (post-traumatic stress disorder)    from [place]  . 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 Shortness of breath dyspnea    with exertion  . Sleep apnea    cpap- see ov note in [facility] 05/12/11 for settings     Current Outpatient Medications on File Prior to Visit  Medication Sig Dispense Refill  . amoxicillin (AMOXIL) 500 MG tablet Take 4  tablets (2 g) by mouth 30-60 minutes prior to dental appointment (Patient taking differently: Take 2,000 mg by mouth See admin instructions. 2,000 mg 30-60 minutes prior to dental or medical procedures) 12 tablet 0  . apixaban (ELIQUIS) 5 MG TABS tablet Take 1 tablet (5 mg total) by mouth 2 (two) times daily. 180 tablet 3  . Artificial Saliva (MOUTH KOTE MT) Use as directed 4 sprays in the mouth or throat every 3 (three) hours as needed (for dry mouth).    . carvedilol (COREG) 6.25 MG tablet Take 1 tablet (6.25 mg total) by mouth 2 (two) times daily with a meal. 180 tablet 3  . cetirizine (ZYRTEC) 10 MG tablet Take 10 mg by mouth daily as needed (for allergies.).     Marland Kitchen Cholecalciferol (VITAMIN D) 2000 units CAPS Take 2,000 Units by mouth daily.    .  diphenhydrAMINE (BENADRYL) 25 MG tablet Take 25 mg by mouth at bedtime as needed (for sleep.).    Marland Kitchen fluticasone (FLONASE) 50 MCG/ACT nasal spray Place 1 spray into both nostrils daily as needed for allergies or rhinitis.    Marland Kitchen HYDROcodone-acetaminophen (NORCO) 10-325 MG tablet Take 1 tablet by mouth every 4 (four) hours as needed for moderate pain. 20 tablet 0  . Hypromellose (ARTIFICIAL TEARS OP) Apply 1 drop to eye 2 (two) times daily as needed (dry eyes).    Marland Kitchen ibuprofen (ADVIL,MOTRIN) 200 MG tablet Take 400-800 mg by mouth every 8 (eight) hours as needed (for knee/back pain or arthritis pain.).    Marland Kitchen nitroGLYCERIN (NITROSTAT) 0.4 MG SL tablet DISSOLVE ONE TABLET UNDER THE TONGUE EVERY 5 MINUTES AS NEEDED FOR CHEST PAIN.  DO NOT EXCEED A TOTAL OF 3 DOSES IN 15 MINUTES 25 tablet 3  . PARoxetine (PAXIL) 40 MG tablet Take 20 mg by mouth daily as needed (PTSD symptoms).    . ranolazine (RANEXA) 500 MG 12 hr tablet Take 1 tablet (500 mg total) by mouth 2 (two) times daily. 180 tablet 3  . tadalafil (CIALIS) 20 MG tablet Take 20 mg by mouth daily as needed for erectile dysfunction.    Marland Kitchen testosterone cypionate (DEPOTESTOSTERONE CYPIONATE) 200 MG/ML injection Inject 200 mg into the muscle once a week.    . traZODone (DESYREL) 150 MG tablet Take 150 mg by mouth at bedtime.      No current facility-administered medications on file prior to visit.     Allergies  Allergen Reactions  . Codeine Itching and Other (See Comments)    Extremity tingling-- can take synthetic  . Crestor [Rosuvastatin] Other (See Comments)    Muscle aches   . Lipitor [Atorvastatin] Other (See Comments)    Muscle aches  . Pravastatin     Muscle aches    Pure hypercholesterolemia LDL-c remains above goal for secondary prevention while on maximum tolerated statin dose ; therefore, is appropriate to initiate PCSK9i therapy. Current AKI will not contrainidicate PCSK9i therapy at this time. Patient stopped statin recently after  increasing muscle weakness and pain.  Repatha/Praluent indication, mechanism of action, dosing, administration, storage, common side effect, and pre-approval process were discuss during OV. Patient requests print Rx to take to [facility] were he has a classification of 100% service connected and received medication free of charge.  Positive lifestyle modifications ,with emphasis on diet, were also discussed during this appointment.  Will provide printed Rx for Praluent 150mg  Lyle every 14 days to patient. He is to call clinic once able to initiate therapy so we can schedule  follow up blood work.   Brayah Urquilla Rodriguez-Guzman PharmD, BCPS, [place] [id number] 01/27/2018 4:42 PM

## 2018-01-27 NOTE — Progress Notes (Signed)
Patient ID: [name], male   DOB: October 26, 1945, 72 y.o.   MRN: [id number] PCP: Dr Harrington Challenger Cardiology: Dr. Aundra Dubin  73 y.o. with history of CAD s/p CABG and obesity s/p lap banding procedure presents for cardiology followup.  Given exertional dyspnea and chest tightness, he had LHC in 1/15.  This showed no change from prior.  Grafts were patent and there was severe stenosis in distal D1, not amenable to PCI.   In 2017, he developed increased exertional dyspnea.  Eventually had a TEE showing severe MR with prolapse of a portion of the anterior mitral valve leaflet.  Coronary angiography in 6/17 showed patent grafts and 90% stenosis of distal diagonal not amenable to intervention.  In 8/17, he had mitral valve repair.  Post-op diastolic CHF, Lasix started and increased.   He had a cath again in 9/18 showing stable coronary anatomy.    He developed atrial arrhythmias in 12/18.  Both fibrillation and flutter were seen.  Saw Dr. Rayann Heman, recommended DCCV with amiodarone or sotalol if fibrillation recurs.  He had DCCV in 1/19.   Today he returns for an acute visit due to worsening renal function. Last week he called the HF clinic due to fatigue. We obtained lab work that showed worsening renal funciton. He was instructed hold diuretics on 5/30. Diuretics held but creatinine remained unchanged. Today he is complaining of fatigue. Denies SOB/PND/Orthopnea . He is not aware of any hypotensive episodes. He has not been taking an NSAIDs. Taking all medications.   Labs (11/12): LDL 70 Labs (5/13): K 4.1, creatinine 1.1, BNP 48 Labs (12/13): K 3.4, creatinine 1.07, LDL 59, HDL 37 Labs (1/15): K 4.3, creatinine 1.2, HCT 40.3 Labs (3/15): LDL 38, HDL 81, BNP 55, K 4, creatinine 1.2 Labs (6/15): K 4.2, creatinine 1.4, LFTs normal, LDL 66, HDL 35 Labs (8/16): K 4, creatinine 1.6, BNP 64 Labs (12/16): LDL 61, HDL 37 Labs (4/17): K 4.2, creatinine 1.35, HCT 38.7, BNP 80 Labs (8/17): K 4.2, creatinine 1.4 => 1.66,  BNP 159 Labs (12/17): K 3.6, creatinine 1.75 Labs (2/18): LDL 76, HDL 36 Labs (9/18): K 3.9, creatinine 1.76 Labs (1/19): K 4.9, creatinine 1.76, hgb 14 Labs (01/20/18): K 4.3 Creatinine 3.35 Labs (01/26/2018): K 5.5 Creatinine 3.26   PMH: 1. OSA: on CPAP.  2. Diabetes mellitus 3. Allergic rhinitis 4. CAD: s/p CABG 1995.  LHC (10/11) with 90% distal D1 (patent proximal D1 stent), total occlusion CFX, total occlusion RCA, no significant stenoses in LAD, SVG-PDA patent, SVG-OM patent, EF 55%.  Medical management.  LHC (5/13) with patent grafts and 90% distal D1 stenosis (no significant change from 10/11).  LHC (1/15) with patent grafts, 75% ISR in D1 stent, 90% stenosis distal D1 (small caliber). D1 not amenable to intervention.  - Coronary angiography (6/17): grafts patent, 90% stenosis distally in large diagonal not amenable to intervention.   - LHC (9/18): SVG-OM and SVG-PDA patent,  75% in-stent restenosis in proximal diagonal then 90% stenosis distally (small in caliber at that point), totally occluded LCx and RCA.  Left coronary system similar to prior caths, D1 may be source of angina.  5. Obesity: lap-banding in 5/11.  6. Chronic diastolic CHF: Echo (3/76) with EF 55-60%, moderate LVH, moderate diastolic dysfunction, s/p mitral valve repair with elevated mean gradient but normal pressure half-time.  - Echo (8/18): EF 55-60%, s/p MV repair with mean gradient 6 mmHg, PASP 28 mmHg, mildly decreased RV systolic function.  7. TIA: Carotid dopplers in 11/11  with 0-39% bilateral stenosis.  Carotid dopplers 6/17 with 1-39% RICA stenosis.  8. OA: Knees, C-spine. TKR 9/13.  9. HTN 10. Hyperlipidemia: Myalgias with atorvastatin and Crestor.  11. Diverticulosis 12. MItral regurgitation: TEE (6/17) with EF 55-60%, severe eccentric MR, prolapse of a portion of the anterior leaflet, peak RV-RA gradient 45 mmHg.  - Mitral valve repair 8/17 13. CKD: Stage III.  14. Atrial fibrillation/atrial flutter:  Both arrhythmias noted in 12/18.  DCCV 12/19.    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  Lives in [place].   Family History  Problem Relation Age of Onset  . Other Mother        joint problems  . Hypertension Father   . Heart disease Father   . CVA Father   . Depression Father   . Heart disease Sister        CABG  . Stroke Sister   . Hypertension Sister   . Congestive Heart Failure Sister   . Diabetes Sister   . Heart disease Brother   . Hypertension Brother   . Congestive Heart Failure Brother     ROS: All systems reviewed and negative except as per HPI.    Current Outpatient Medications  Medication Sig Dispense Refill  . amoxicillin (AMOXIL) 500 MG tablet Take 4 tablets (2 g) by mouth 30-60 minutes prior to dental appointment (Patient taking differently: Take 2,000 mg by mouth See admin instructions. 2,000 mg 30-60 minutes prior to dental or medical procedures) 12 tablet 0  . apixaban (ELIQUIS) 5 MG TABS tablet Take 1 tablet (5 mg total) by mouth 2 (two) times daily. 180 tablet 3  . Artificial Saliva (MOUTH KOTE MT) Use as directed 4 sprays in the mouth or throat every 3 (three) hours as needed (for dry mouth).    Marland Kitchen buPROPion (WELLBUTRIN) 75 MG tablet Take 75 mg by mouth daily.    . carvedilol (COREG) 6.25 MG tablet Take 1 tablet (6.25 mg total) by mouth 2 (two) times daily with a meal. 180 tablet 3  . cetirizine (ZYRTEC) 10 MG tablet Take 10 mg by mouth daily as needed (for allergies.).     Marland Kitchen Cholecalciferol (VITAMIN D) 2000 units CAPS Take 2,000 Units by mouth daily.    . diphenhydrAMINE (BENADRYL) 25 MG tablet Take 25 mg by mouth at bedtime as needed (for sleep.).    Marland Kitchen fluticasone (FLONASE) 50 MCG/ACT nasal spray Place 1 spray into both nostrils daily as needed for allergies or rhinitis.    Marland Kitchen HYDROcodone-acetaminophen (NORCO) 10-325 MG tablet Take 1 tablet by mouth every 4 (four) hours as needed for moderate pain. 20 tablet 0  . Hypromellose  (ARTIFICIAL TEARS OP) Apply 1 drop to eye 2 (two) times daily as needed (dry eyes).    Marland Kitchen ibuprofen (ADVIL,MOTRIN) 200 MG tablet Take 400-800 mg by mouth every 8 (eight) hours as needed (for knee/back pain or arthritis pain.).    Marland Kitchen metFORMIN (GLUCOPHAGE) 500 MG tablet Take 500 mg by mouth 2 (two) times daily with a meal.    . nitroGLYCERIN (NITROSTAT) 0.4 MG SL tablet DISSOLVE ONE TABLET UNDER THE TONGUE EVERY 5 MINUTES AS NEEDED FOR CHEST PAIN.  DO NOT EXCEED A TOTAL OF 3 DOSES IN 15 MINUTES 25 tablet 3  . PARoxetine (PAXIL) 40 MG tablet Take 20 mg by mouth daily as needed (PTSD symptoms).    . ranolazine (RANEXA) 500 MG 12 hr tablet Take 1 tablet (500 mg total) by mouth 2 (two)  times daily. 180 tablet 3  . tadalafil (CIALIS) 20 MG tablet Take 20 mg by mouth daily as needed for erectile dysfunction.    Marland Kitchen testosterone cypionate (DEPOTESTOSTERONE CYPIONATE) 200 MG/ML injection Inject 200 mg into the muscle once a week.    . traZODone (DESYREL) 150 MG tablet Take 150 mg by mouth at bedtime.      No current facility-administered medications for this encounter.     BP 130/72   Pulse 67   Wt 222 lb (100.7 kg)   SpO2 96%   BMI 32.78 kg/m  General:  . No resp difficulty. Wife present  HEENT: normal Neck: supple. JVP 6-7. Carotids 2+ bilat; no bruits. No lymphadenopathy or thryomegaly appreciated. Cor: PMI nondisplaced. Regular rate & rhythm. No rubs, gallops or murmurs. Lungs: clear Abdomen: soft, nontender, nondistended. No hepatosplenomegaly. No bruits or masses. Good bowel sounds. Extremities: no cyanosis, clubbing, rash, edema Neuro: alert & orientedx3, cranial nerves grossly intact. moves all 4 extremities w/o difficulty. Affect pleasant   Assessment/Plan: 1. S/p mitral valve repair:  Echo in 8/18 showed stable mitral valve repair with no MR, mean gradient 6 mmHg.   - With prosthetic material, should use antibiotic prophylaxis with dental work.  2. CAD:  He has severe stenosis in a  distal D1 that is not amenable to intervention.  This has been stable on last couple of caths.   No s/s ischemia .  - Continue pravastatin and Coreg.   - No ASA with stable coronary disease and apixaban use.  - He does not like Imdur, feels like it fatigues him. If chest pain returns, he can use ranolazine.  -3. Hyperlipidemia: Myalgias with Crestor and atorvastatin.  He is now on pravastatin, lipids were acceptable in 2/18. Needs repeat lipids, will arrange.  4. Chronic diastolic CHF:  NYHA II-III. Volume status stable despite being off diuretics.  Keep off diuretics for now. 5. OSA: Continue CPAP.  6. Atrial fibrillation: Paroxysmal, in NSR after DCCV in 1/19.  If recurs, may need amiodarone or sotalol.  - Continue apixaban 5 mg bid.  - Ranolazine will likely decrease risk of recurrence.  7. AKI Creatinine up from 1.8>3.35 Keep off diuretics. He has not been taking NSAIDs. I am not sure what happened.  Check renal US today---> normal kidney and bladder.  Set up follow with [facility].  8. DMII Stop metformin with worsening renal function. Instructed to follow up with PCP.  9. Hyperkalemia Stop potassium    Follow up with Dr Aundra Dubin in 4 weeks. He was instructed to call the HF clinic with 3-4 pound weight gain in 24 hours.   Yanely Mast NP-C  01/27/2018

## 2018-01-28 ENCOUNTER — Ambulatory Visit (HOSPITAL_COMMUNITY): Payer: Medicare Other

## 2018-01-28 ENCOUNTER — Encounter (HOSPITAL_COMMUNITY): Payer: Medicare Other

## 2018-01-28 DIAGNOSIS — N183 Chronic kidney disease, stage 3 (moderate): Secondary | ICD-10-CM | POA: Diagnosis not present

## 2018-01-28 DIAGNOSIS — D631 Anemia in chronic kidney disease: Secondary | ICD-10-CM | POA: Diagnosis not present

## 2018-01-28 DIAGNOSIS — I129 Hypertensive chronic kidney disease with stage 1 through stage 4 chronic kidney disease, or unspecified chronic kidney disease: Secondary | ICD-10-CM | POA: Diagnosis not present

## 2018-01-28 DIAGNOSIS — I2510 Atherosclerotic heart disease of native coronary artery without angina pectoris: Secondary | ICD-10-CM | POA: Diagnosis not present

## 2018-01-28 DIAGNOSIS — I509 Heart failure, unspecified: Secondary | ICD-10-CM | POA: Diagnosis not present

## 2018-01-28 DIAGNOSIS — Z9889 Other specified postprocedural states: Secondary | ICD-10-CM | POA: Diagnosis not present

## 2018-01-28 DIAGNOSIS — I4891 Unspecified atrial fibrillation: Secondary | ICD-10-CM | POA: Diagnosis not present

## 2018-01-28 DIAGNOSIS — N2581 Secondary hyperparathyroidism of renal origin: Secondary | ICD-10-CM | POA: Diagnosis not present

## 2018-01-28 DIAGNOSIS — E1122 Type 2 diabetes mellitus with diabetic chronic kidney disease: Secondary | ICD-10-CM | POA: Diagnosis not present

## 2018-01-28 DIAGNOSIS — N179 Acute kidney failure, unspecified: Secondary | ICD-10-CM | POA: Diagnosis not present

## 2018-01-28 DIAGNOSIS — E669 Obesity, unspecified: Secondary | ICD-10-CM | POA: Diagnosis not present

## 2018-01-28 DIAGNOSIS — E785 Hyperlipidemia, unspecified: Secondary | ICD-10-CM | POA: Diagnosis not present

## 2018-01-31 ENCOUNTER — Ambulatory Visit (HOSPITAL_COMMUNITY): Payer: Medicare Other

## 2018-01-31 ENCOUNTER — Telehealth: Payer: Self-pay | Admitting: Pharmacist

## 2018-01-31 ENCOUNTER — Encounter (HOSPITAL_COMMUNITY): Payer: Medicare Other

## 2018-01-31 DIAGNOSIS — R5381 Other malaise: Secondary | ICD-10-CM | POA: Diagnosis not present

## 2018-01-31 DIAGNOSIS — E1122 Type 2 diabetes mellitus with diabetic chronic kidney disease: Secondary | ICD-10-CM | POA: Diagnosis not present

## 2018-01-31 NOTE — Telephone Encounter (Signed)
Rx for Praluent left up front for pt to bring to the [place]. Spoke with pt and he will pick up rx today.

## 2018-02-01 NOTE — Telephone Encounter (Signed)
Supporting documentation faxed to [facility] request to support non-formulary Praluent rx. Fax sent to Arnoldo Morale, RN [phone].

## 2018-02-02 ENCOUNTER — Ambulatory Visit (HOSPITAL_COMMUNITY): Payer: Medicare Other

## 2018-02-02 ENCOUNTER — Encounter (HOSPITAL_COMMUNITY): Payer: Medicare Other

## 2018-02-03 ENCOUNTER — Ambulatory Visit (HOSPITAL_COMMUNITY)
Admission: RE | Admit: 2018-02-03 | Discharge: 2018-02-03 | Disposition: A | Discharge status: Home / Self Care | Payer: Medicare Other | Source: Ambulatory Visit | Attending: Cardiology | Admitting: Cardiology

## 2018-02-03 ENCOUNTER — Telehealth (HOSPITAL_COMMUNITY): Payer: Self-pay | Admitting: *Deleted

## 2018-02-03 ENCOUNTER — Telehealth (HOSPITAL_COMMUNITY): Payer: Self-pay | Admitting: Cardiac Rehabilitation

## 2018-02-03 DIAGNOSIS — I5032 Chronic diastolic (congestive) heart failure: Secondary | ICD-10-CM | POA: Diagnosis not present

## 2018-02-03 LAB — BASIC METABOLIC PANEL
ANION GAP: 9 (ref 5–15)
BUN: 16 mg/dL (ref 6–20)
CHLORIDE: 102 mmol/L (ref 101–111)
CO2: 28 mmol/L (ref 22–32)
Calcium: 8.8 mg/dL — ABNORMAL LOW (ref 8.9–10.3)
Creatinine, Ser: 3.46 mg/dL — ABNORMAL HIGH (ref 0.61–1.24)
GFR calc Af Amer: 19 mL/min — ABNORMAL LOW (ref >60)
GFR, EST NON AFRICAN AMERICAN: 16 mL/min — AB (ref >60)
GLUCOSE: 151 mg/dL — AB (ref 65–99)
Potassium: 3.6 mmol/L (ref 3.5–5.1)
Sodium: 139 mmol/L (ref 135–145)

## 2018-02-03 NOTE — Telephone Encounter (Signed)
Result Notes for Basic metabolic panel   Notes recorded by Darron Doom, RN on 02/03/2018 at 4:46 PM EDT Patient had follow up appointment scheduled with [facility] on 01/28/18. Labs and Renal US results were faxed to there office on 01/27/18. ------  Notes recorded by Darrick Grinder D, NP on 02/03/2018 at 4:17 PM EDT BMET unchanged. Make sure he has follow up with Nephrology. Needs appointment ASAP

## 2018-02-03 NOTE — Telephone Encounter (Signed)
pc to assess reason for continued absence from cardiac rehab.  LMOM.  Andi Hence, RN, BSN Cardiac Pulmonary Rehab 02/03/18

## 2018-02-04 ENCOUNTER — Ambulatory Visit (HOSPITAL_COMMUNITY): Payer: Medicare Other

## 2018-02-04 ENCOUNTER — Encounter (HOSPITAL_COMMUNITY): Payer: Medicare Other

## 2018-02-04 DIAGNOSIS — R0781 Pleurodynia: Secondary | ICD-10-CM | POA: Diagnosis not present

## 2018-02-04 NOTE — Progress Notes (Signed)
Cardiac Individual Treatment Plan  Patient Details  Name: [name] MRN: [id number] Date of Birth: 1945/12/04 Referring Provider:   Flowsheet Row CARDIAC REHAB PHASE II ORIENTATION from 11/18/2017 in [facility] [facility]  Referring Provider  [name] MD      Initial Encounter Date:  Flowsheet Row CARDIAC REHAB PHASE II ORIENTATION from 11/18/2017 in [facility]  Date  11/18/17  Referring Provider  [name] MD      Visit Diagnosis: 03/25/16 S/P MVR (mitral valve repair)  Stable angina ([place])  Patient's Home Medications on Admission:  Current Outpatient Medications:  .  Alirocumab (PRALUENT) 150 MG/ML SOPN, Inject 150 mg into the skin every 14 (fourteen) days., Disp: 2 pen, Rfl: 11 .  amoxicillin (AMOXIL) 500 MG tablet, Take 4 tablets (2 g) by mouth 30-60 minutes prior to dental appointment (Patient taking differently: Take 2,000 mg by mouth See admin instructions. 2,000 mg 30-60 minutes prior to dental or medical procedures), Disp: 12 tablet, Rfl: 0 .  apixaban (ELIQUIS) 5 MG TABS tablet, Take 1 tablet (5 mg total) by mouth 2 (two) times daily., Disp: 180 tablet, Rfl: 3 .  Artificial Saliva (MOUTH KOTE MT), Use as directed 4 sprays in the mouth or throat every 3 (three) hours as needed (for dry mouth)., Disp: , Rfl:  .  buPROPion (WELLBUTRIN) 75 MG tablet, Take 75 mg by mouth daily., Disp: , Rfl:  .  carvedilol (COREG) 6.25 MG tablet, Take 1 tablet (6.25 mg total) by mouth 2 (two) times daily with a meal., Disp: 180 tablet, Rfl: 3 .  cetirizine (ZYRTEC) 10 MG tablet, Take 10 mg by mouth daily as needed (for allergies.). , Disp: , Rfl:  .  Cholecalciferol (VITAMIN D) 2000 units CAPS, Take 2,000 Units by mouth daily., Disp: , Rfl:  .  diphenhydrAMINE (BENADRYL) 25 MG tablet, Take 25 mg by mouth at bedtime as needed (for sleep.)., Disp: , Rfl:  .  fluticasone (FLONASE) 50 MCG/ACT nasal spray, Place 1 spray into both  nostrils daily as needed for allergies or rhinitis., Disp: , Rfl:  .  HYDROcodone-acetaminophen (NORCO) 10-325 MG tablet, Take 1 tablet by mouth every 4 (four) hours as needed for moderate pain., Disp: 20 tablet, Rfl: 0 .  Hypromellose (ARTIFICIAL TEARS OP), Apply 1 drop to eye 2 (two) times daily as needed (dry eyes)., Disp: , Rfl:  .  ibuprofen (ADVIL,MOTRIN) 200 MG tablet, Take 400-800 mg by mouth every 8 (eight) hours as needed (for knee/back pain or arthritis pain.)., Disp: , Rfl:  .  nitroGLYCERIN (NITROSTAT) 0.4 MG SL tablet, DISSOLVE ONE TABLET UNDER THE TONGUE EVERY 5 MINUTES AS NEEDED FOR CHEST PAIN.  DO NOT EXCEED A TOTAL OF 3 DOSES IN 15 MINUTES, Disp: 25 tablet, Rfl: 3 .  PARoxetine (PAXIL) 40 MG tablet, Take 20 mg by mouth daily as needed (PTSD symptoms)., Disp: , Rfl:  .  ranolazine (RANEXA) 500 MG 12 hr tablet, Take 1 tablet (500 mg total) by mouth 2 (two) times daily., Disp: 180 tablet, Rfl: 3 .  tadalafil (CIALIS) 20 MG tablet, Take 20 mg by mouth daily as needed for erectile dysfunction., Disp: , Rfl:  .  testosterone cypionate (DEPOTESTOSTERONE CYPIONATE) 200 MG/ML injection, Inject 200 mg into the muscle once a week., Disp: , Rfl:  .  traZODone (DESYREL) 150 MG tablet, Take 150 mg by mouth at bedtime. , Disp: , Rfl:   Past Medical History: Past Medical History:  Diagnosis Date  . Allergic rhinitis   . Anxiety   . Cancer ([place])    hx of skin cancers   . Coronary artery disease    a. s/p MI & CABG; b. s/p PCI Diag;  c. Cath 12/2011: LAD diff dzs/small, Diag 75% isr, 90 dist to stent (small), LCX & RCA occluded, VG->OM 40, VG->PDA patent - med rx.  . Depression   . Diabetes mellitus    ORAL MEDS  . Diastolic CHF, chronic (HCC)    a. EF 55-60% by echo 2007  . Dysrhythmia    "abnormal beat"  . GERD (gastroesophageal reflux disease)    "not anymore" " I had a lap band"  . History of diverticulitis of colon    5 YRS AGO  . History of pneumonia    2017  . History of  transient ischemic attack (TIA)    CAROTID DOPPLERS NOV 2011  0-39& BIL. STENOSIS  . Hyperlipidemia   . Hypertension   . Mitral regurgitation   . Myocardial infarction ([place])   . Neuromuscular disorder ([place])    left arm numbness   . 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 PTSD (post-traumatic stress disorder)    from [place]  . 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 Shortness of breath dyspnea    with exertion  . Sleep apnea    cpap- see ov note in [facility] 05/12/11 for settings     Tobacco Use: Social History   Tobacco Use  Smoking Status Never Smoker  Smokeless Tobacco Never Used    Labs: Recent Review Flowsheet Data    Labs for ITP Cardiac and Pulmonary Rehab Latest Ref Rng & Units 03/26/2016 08/07/2016 10/14/2016 10/06/2017 12/09/2017   Cholestrol 0 - 200 mg/dL - 159 136 188 151   LDLCALC 0 - 99 mg/dL - 60 76 92 75   HDL >40 mg/dL - 30(L) 36(L) 49 38(L)   Trlycerides <150 mg/dL - 345(H) 122 237(H) 188(H)   Hemoglobin A1c 4.8 - 5.6 % - - - - -   PHART 7.350 - 7.450 - - - - -   PCO2ART 35.0 - 45.0 mmHg - - - - -   HCO3 20.0 - 24.0 mEq/L - - - - -   TCO2 0 - 100 mmol/L 22 - - - -   ACIDBASEDEF 0.0 - 2.0 mmol/L - - - - -   O2SAT % - - - - -      Capillary Blood Glucose: Lab Results  Component Value Date   GLUCAP 144 (H) 12/22/2017   GLUCAP 157 (H) 11/24/2017   GLUCAP 144 (H) 11/24/2017   GLUCAP 168 (H) 11/22/2017   GLUCAP 135 (H) 11/22/2017     Exercise Target Goals:    Exercise Program Goal: Individual exercise prescription set using results from initial 6 min walk test and THRR while considering  patient's activity barriers and safety.   Exercise Prescription Goal: Initial exercise prescription builds to 30-45 minutes  a day of aerobic activity, 2-3 days per week.  Home exercise guidelines will be given to patient during program as part of exercise prescription that the participant will acknowledge.  Activity Barriers & Risk Stratification: Activity Barriers & Cardiac Risk Stratification - 11/18/17 0920    Activity Barriers &  Cardiac Risk Stratification          Activity Barriers  Right Hip Replacement;Other (comment);Back Problems;Deconditioning;Muscular Weakness;Arthritis    Comments  R toe discomfort    Cardiac Risk Stratification  High           6 Minute Walk: 6 Minute Walk    6 Minute Walk    Row Name 11/18/17 1039   Phase  Initial   Distance  1723 feet   Distance % Change  0 %   Distance Feet Change  0 ft   Walk Time  6 minutes   # of Rest Breaks  0   MPH  3.26   METS  3.31   RPE  13   Perceived Dyspnea   0   VO2 Peak  11.58   Symptoms  Yes (comment)   Comments  Right Toe Discomfort +5/10   Resting HR  64 bpm   Resting BP  118/62   Resting Oxygen Saturation   97 %   Exercise Oxygen Saturation  during 6 min walk  100 %   Max Ex. HR  100 bpm   Max Ex. BP  128/70   2 Minute Post BP  118/70          Oxygen Initial Assessment:   Oxygen Re-Evaluation:   Oxygen Discharge (Final Oxygen Re-Evaluation):   Initial Exercise Prescription: Initial Exercise Prescription - 11/18/17 1100    Date of Initial Exercise RX and Referring Provider          Date  11/18/17    Referring Provider  [name] MD        Treadmill          MPH  2.8    Grade  0    Minutes  10    METs  3.14        Bike          Level  1    Minutes  10    METs  2.99        NuStep          Level  4    SPM  70    Minutes  10    METs  2.6        Prescription Details          Frequency (times per week)  3x    Duration  Progress to 30 minutes of continuous aerobic without signs/symptoms of physical distress        Intensity          THRR 40-80% of Max Heartrate  59-118    Ratings of  Perceived Exertion  11-15    Perceived Dyspnea  0-4        Progression          Progression  Continue progressive overload as per policy without signs/symptoms or physical distress.        Resistance Training          Training Prescription  Yes    Weight  3lbs    Reps  10-15           Perform Capillary Blood Glucose checks as needed.  Exercise Prescription Changes: Exercise Prescription Changes    Response to Exercise    Row Name 11/22/17 1600 12/08/17 0816 12/22/17 1100 01/03/18 1632 01/19/18 1123   Blood Pressure (Admit)  106/72  114/68  122/70  108/60  118/70   Blood Pressure (Exercise)  128/70  126/70  120/64  120/70  128/60   Blood Pressure (Exit)  100/70  104/62  118/78  98/60  114/64   Heart Rate (Admit)  72 bpm  67 bpm  95 bpm  89 bpm  73 bpm   Heart Rate (Exercise)  89 bpm  87 bpm  109 bpm  100 bpm  105 bpm   Heart Rate (Exit)  83 bpm  77 bpm  81 bpm  81 bpm  67 bpm   Rating of Perceived Exertion (Exercise)  12  11  10  11  11    Perceived Dyspnea (Exercise)  no documentation  0  0  0  0   Symptoms  None  None  None  None  None   Comments  Pt oriented to exercise equipment  no documentation  no documentation  no documentation  no documentation   Duration  Progress to 30 minutes of  aerobic without signs/symptoms of physical distress  Continue with 30 min of aerobic exercise without signs/symptoms of physical distress.  Continue with 30 min of aerobic exercise without signs/symptoms of physical distress.  Continue with 30 min of aerobic exercise without signs/symptoms of physical distress.  Continue with 30 min of aerobic exercise without signs/symptoms of physical distress.   Intensity  THRR New  THRR unchanged  THRR unchanged  THRR unchanged  THRR unchanged       Progression    Row Name 11/22/17 1600 12/08/17 0816 12/22/17 1100 01/03/18 1632 01/19/18 1123   Progression  Continue to progress workloads to maintain intensity without signs/symptoms of physical distress.   Continue to progress workloads to maintain intensity without signs/symptoms of physical distress.  Continue to progress workloads to maintain intensity without signs/symptoms of physical distress.  Continue to progress workloads to maintain intensity without signs/symptoms of physical distress.  Continue to progress workloads to maintain intensity without signs/symptoms of physical distress.   Average METs  2.9  3.53  4  3.8  3.8       Resistance Training    Row Name 11/22/17 1600 12/08/17 0816 12/22/17 1100 01/03/18 1632 01/19/18 1123   Training Prescription  Yes  Yes  No relaxation day  no documentation  No relaxation day   Weight  3lbs  6lbs  no documentation  8lbs  no documentation   Reps  10-15  10-15  no documentation  10-15  no documentation   Time  10 Minutes  10 Minutes  10 Minutes  10 Minutes  10 Minutes       Interval Training    Row Name 11/22/17 1600 12/08/17 0816 12/22/17 1100 01/03/18 1632 01/19/18 1123   Interval Training  no documentation  No  No  No  No       Treadmill    Row Name 11/22/17 1600 12/08/17 0816 12/22/17 1100 01/03/18 1632 01/19/18 1123   MPH  2.8  2.8  2.8  3  3    Grade  0  3  3  3  3    Minutes  10  10  10  10  10    METs  3.14  4.3  4.3  4.54  4.54       Bike    Row Name 11/22/17 1600 12/08/17 0816 12/22/17 1100 01/03/18 1632 01/19/18 1123   Level  1  1.3  1.3  1.3  1.3   Minutes  10  10  10  10  10    METs  2.96  3.6  3.6  3.59  3.59  NuStep    Row Name 11/22/17 1600 12/08/17 0816 12/22/17 1100 01/03/18 1632 01/19/18 1123   Level  4  4  4  5  5    SPM  85  85  85  90  90   Minutes  10  10  10  10  10    METs  2.6  3  3   3.2  3.2       Home Exercise Plan    Row Name 11/22/17 1600 12/08/17 0816 12/22/17 1100 01/03/18 1632 01/19/18 1123   Plans to continue exercise at  no documentation  Forensic scientist (comment) Health visitor (comment) Health visitor (comment) Financial controller (comment) Educational psychologist [employer]   Frequency  no documentation  Add 3 additional days to program exercise sessions.  Add 3 additional days to program exercise sessions.  Add 3 additional days to program exercise sessions.  Add 3 additional days to program exercise sessions.   Initial Home Exercises Provided  no documentation  12/06/17  12/06/17  12/06/17  12/06/17          Exercise Comments: Exercise Comments    Row Name 11/22/17 1626 12/09/17 0819 12/09/17 0820 01/04/18 1445 02/02/18 1624   Exercise Comments  Pt oriented to exericse equipment. Pt off to a great start. Will continue to monitor and follow up with pt.   Pt is doing great in cardiac rehab with exercise. Pt is responding well to workload increases. Will continue to monitor and progress pt.   Pt is doing great in cardiac rehab with exercise. Pt is responding well to workload increases. Reveiwed HEP with pt. Pt is currently exericses 3 days a week at local fitness center. Will continue to monitor and progress pt.   Reviewed METs and goals. Pt is tolerating exercise prescription very well. Pt is often limited due to SOB symptoms and overdoing activities. Will continue to monitor and progress as able.   Unable to review METs and goal at this time. Pt last exercise exercise session on 01/19/18. Will f/u with patient.      Exercise Goals and Review: Exercise Goals    Exercise Goals    Row Name 11/18/17 0921   Increase Physical Activity  Yes   Intervention  Provide advice, education, support and counseling about physical activity/exercise needs.;Develop an individualized exercise prescription for aerobic and resistive training based on initial evaluation findings, risk stratification, comorbidities and participant's personal goals.   Expected Outcomes  Short Term: Attend rehab on a regular basis to increase amount of physical activity.;Long Term: Exercising regularly at least 3-5 days a week.;Long Term: Add in home exercise  to make exercise part of routine and to increase amount of physical activity.   Increase Strength and Stamina  Yes Develop exercise routine to get back to gym. Also increase energy levels.   Intervention  Provide advice, education, support and counseling about physical activity/exercise needs.;Develop an individualized exercise prescription for aerobic and resistive training based on initial evaluation findings, risk stratification, comorbidities and participant's personal goals.   Expected Outcomes  Short Term: Increase workloads from initial exercise prescription for resistance, speed, and METs.;Short Term: Perform resistance training exercises routinely during rehab and add in resistance training at home;Long Term: Improve cardiorespiratory fitness, muscular endurance and strength as measured by increased METs and functional capacity (6MWT)   Able to understand and use rate of perceived exertion (RPE) scale  Yes   Intervention  Provide  education and explanation on how to use RPE scale   Expected Outcomes  Short Term: Able to use RPE daily in rehab to express subjective intensity level;Long Term:  Able to use RPE to guide intensity level when exercising independently   Knowledge and understanding of Target Heart Rate Range (THRR)  Yes   Intervention  Provide education and explanation of THRR including how the numbers were predicted and where they are located for reference   Expected Outcomes  Short Term: Able to state/look up THRR;Long Term: Able to use THRR to govern intensity when exercising independently   Able to check pulse independently  Yes   Intervention  Provide education and demonstration on how to check pulse in carotid and radial arteries.;Review the importance of being able to check your own pulse for safety during independent exercise   Expected Outcomes  Short Term: Able to explain why pulse checking is important during independent exercise;Long Term: Able to check pulse independently  and accurately   Understanding of Exercise Prescription  Yes   Intervention  Provide education, explanation, and written materials on patient's individual exercise prescription   Expected Outcomes  Short Term: Able to explain program exercise prescription;Long Term: Able to explain home exercise prescription to exercise independently          Exercise Goals Re-Evaluation : Exercise Goals Re-Evaluation    Exercise Goal Re-Evaluation    Row Name 12/09/17 0820 12/09/17 0821 01/04/18 1449 02/02/18 1627   Exercise Goals Review  Increase Physical Activity  Increase Physical Activity;Increase Strength and Stamina;Able to understand and use rate of perceived exertion (RPE) scale;Knowledge and understanding of Target Heart Rate Range (THRR);Able to check pulse independently;Understanding of Exercise Prescription  Increase Physical Activity;Increase Strength and Stamina;Able to understand and use rate of perceived exertion (RPE) scale;Knowledge and understanding of Target Heart Rate Range (THRR);Able to check pulse independently;Understanding of Exercise Prescription  Increase Physical Activity;Increase Strength and Stamina;Able to understand and use rate of perceived exertion (RPE) scale;Knowledge and understanding of Target Heart Rate Range (THRR);Able to check pulse independently;Understanding of Exercise Prescription   Comments  no documentation  Reviewed Home Exercise Program with pt. Also reviewed RPE Scale, THRR, NTG use, weather conditions, endpoints of exercise. Pt is responding great to exercise and is continuing to progress workloads.   Pt is steady making progress in activity levels. Pt is active with HEP but needs reminding of activity limitations. Pt has a tendency to overdo it; further discussed activity limitations and emergency precautions. Pt voiced understanding.   Pt is steady making progress in activity levels. Pt is active with HEP but needs reminding of activity limitations. Pt has a  tendency to overdo it; further discussed activity limitations and emergency precautions. Pt voiced understanding.    Expected Outcomes  no documentation  Pt will continue to exercise 3 additional days in addition to exercise at cardiac rehab. Pt will continue to increase funtional capacity. Will continue to monitor and progress pt as tolerated.   Pt will continue to improve in cardiorespiratory fitness, adhere to activity limitations and be compliant with HEP.   Pt will continue to improve in cardiorespiratory fitness, adhere to activity limitations and be compliant with HEP.            Discharge Exercise Prescription (Final Exercise Prescription Changes): Exercise Prescription Changes - 01/19/18 1123    Response to Exercise          Blood Pressure (Admit)  118/70    Blood Pressure (Exercise)  128/60  Blood Pressure (Exit)  114/64    Heart Rate (Admit)  73 bpm    Heart Rate (Exercise)  105 bpm    Heart Rate (Exit)  67 bpm    Rating of Perceived Exertion (Exercise)  11    Perceived Dyspnea (Exercise)  0    Symptoms  None    Duration  Continue with 30 min of aerobic exercise without signs/symptoms of physical distress.    Intensity  THRR unchanged        Progression          Progression  Continue to progress workloads to maintain intensity without signs/symptoms of physical distress.    Average METs  3.8        Resistance Training          Training Prescription  No relaxation day    Time  10 Minutes        Interval Training          Interval Training  No        Treadmill          MPH  3    Grade  3    Minutes  10    METs  4.54        Bike          Level  1.3    Minutes  10    METs  3.59        NuStep          Level  5    SPM  90    Minutes  10    METs  3.2        Home Exercise Plan          Plans to continue exercise at  [employer] (comment) [employer]    Frequency  Add 3 additional days to program exercise sessions.    Initial  Home Exercises Provided  12/06/17           Nutrition:  Target Goals: Understanding of nutrition guidelines, daily intake of sodium 1500mg , cholesterol 200mg , calories 30% from fat and 7% or less from saturated fats, daily to have 5 or more servings of fruits and vegetables.  Biometrics: Pre Biometrics - 11/18/17 1046    Pre Biometrics          Height  5\' 9"  (1.753 m)    Weight  211 lb 13.8 oz (96.1 kg)    Waist Circumference  43.5 inches    Hip Circumference  42 inches    Waist to Hip Ratio  1.04 %    BMI (Calculated)  31.27    Triceps Skinfold  10 mm    % Body Fat  28.9 %    Grip Strength  46 kg    Flexibility  9.5 in    Single Leg Stand  5 seconds            Nutrition Therapy Plan and Nutrition Goals: Nutrition Therapy & Goals - 11/18/17 1123    Nutrition Therapy          Diet  Carb Modified, Therapeutic Lifestyle Changes        Personal Nutrition Goals          Nutrition Goal  1-2 lb wt loss/week to a wt loss goal of 6-24 lb at graduation from Cardiac Rehab    Personal Goal #2  Improved glycemic control as evidenced by an A1c trending from 7.2 toward less than 7  Intervention Plan          Intervention  Prescribe, educate and counsel regarding individualized specific dietary modifications aiming towards targeted core components such as weight, hypertension, lipid management, diabetes, heart failure and other comorbidities.    Expected Outcomes  Short Term Goal: Understand basic principles of dietary content, such as calories, fat, sodium, cholesterol and nutrients.;Long Term Goal: Adherence to prescribed nutrition plan.           Nutrition Assessments: Nutrition Assessments - 11/18/17 1124    MEDFICTS Scores          Pre Score  17           Nutrition Goals Re-Evaluation:   Nutrition Goals Re-Evaluation:   Nutrition Goals Discharge (Final Nutrition Goals Re-Evaluation):   Psychosocial: Target Goals: Acknowledge presence or absence of  significant depression and/or stress, maximize coping skills, provide positive support system. Participant is able to verbalize types and ability to use techniques and skills needed for reducing stress and depression.  Initial Review & Psychosocial Screening: Initial Psych Review & Screening - 11/18/17 1150    Initial Review          Current issues with  None Identified        Family Dynamics          Good Support System?  Yes Pt reports that he has supportive wife, family, and friends.         Barriers          Psychosocial barriers to participate in program  There are no identifiable barriers or psychosocial needs.        Screening Interventions          Interventions  Encouraged to exercise    Expected Outcomes  Short Term goal: Identification and review with participant of any Quality of Life or Depression concerns found by scoring the questionnaire.;Long Term goal: The participant improves quality of Life and PHQ9 Scores as seen by post scores and/or verbalization of changes           Quality of Life Scores: Quality of Life - 11/18/17 1047    Quality of Life Scores          Health/Function Pre  20.23 %    Socioeconomic Pre  29.25 %    Psych/Spiritual Pre  30 %    Family Pre  19.2 %    GLOBAL Pre  24.1 %          Scores of 19 and below usually indicate a poorer quality of life in these areas.  A difference of  2-3 points is a clinically meaningful difference.  A difference of 2-3 points in the total score of the Quality of Life Index has been associated with significant improvement in overall quality of life, self-image, physical symptoms, and general health in studies assessing change in quality of life.  PHQ-9: Recent Review Flowsheet Data    Depression screen [facility] 2/9 11/22/2017 08/21/2016 05/11/2016 02/14/2014   Decreased Interest 0 0 0 0   Down, Depressed, Hopeless 0 0 0 0   PHQ - 2 Score 0 0 0 0     Interpretation of Total Score  Total Score Depression  Severity:  1-4 = Minimal depression, 5-9 = Mild depression, 10-14 = Moderate depression, 15-19 = Moderately severe depression, 20-27 = Severe depression   Psychosocial Evaluation and Intervention: Psychosocial Evaluation - 12/08/17 1149    Psychosocial Evaluation & Interventions          Interventions  Encouraged to exercise with the program and follow exercise prescription    Comments  no psychosocial needs identified, no interventions necessary     Expected Outcomes  pt will exhibit positive outlook with good coping skills.     Continue Psychosocial Services   No Follow up required           Psychosocial Re-Evaluation: Psychosocial Re-Evaluation    Psychosocial Re-Evaluation    [place] Name 12/09/17 1348 12/31/17 1155   Current issues with  None Identified  None Identified   Comments  no psychosocial needs identified, no interventions necessary   no psychosocial needs identified, no interventions necessary    Expected Outcomes  pt will exhibit positive outlook with good coping skills.   pt will exhibit positive outlook with good coping skills.    Interventions  Encouraged to attend Cardiac Rehabilitation for the exercise  Encouraged to attend Cardiac Rehabilitation for the exercise   Continue Psychosocial Services   No Follow up required  No Follow up required          Psychosocial Discharge (Final Psychosocial Re-Evaluation): Psychosocial Re-Evaluation - 12/31/17 1155    Psychosocial Re-Evaluation          Current issues with  None Identified    Comments  no psychosocial needs identified, no interventions necessary     Expected Outcomes  pt will exhibit positive outlook with good coping skills.     Interventions  Encouraged to attend Cardiac Rehabilitation for the exercise    Continue Psychosocial Services   No Follow up required           Vocational Rehabilitation: Provide vocational rehab assistance to qualifying candidates.   Vocational Rehab Evaluation &  Intervention: Vocational Rehab - 11/18/17 1147    Initial Vocational Rehab Evaluation & Intervention          Assessment shows need for Vocational Rehabilitation  No           Education: Education Goals: Education classes will be provided on a weekly basis, covering required topics. Participant will state understanding/return demonstration of topics presented.  Learning Barriers/Preferences: Learning Barriers/Preferences - 11/18/17 0960    Learning Barriers/Preferences          Learning Barriers  None    Learning Preferences  Skilled Demonstration           Education Topics: Count Your Pulse:  -Group instruction provided by verbal instruction, demonstration, patient participation and written materials to support subject.  Instructors address importance of being able to find your pulse and how to count your pulse when at home without a heart monitor.  Patients get hands on experience counting their pulse with staff help and individually. Flowsheet Row CARDIAC REHAB PHASE II EXERCISE from 01/19/2018 in [place]  Date  12/03/17  Instruction Review Code  1- Verbalizes Understanding      Heart Attack, Angina, and Risk Factor Modification:  -Group instruction provided by verbal instruction, video, and written materials to support subject.  Instructors address signs and symptoms of angina and heart attacks.    Also discuss risk factors for heart disease and how to make changes to improve heart health risk factors. Flowsheet Row CARDIAC REHAB PHASE II EXERCISE from 01/19/2018 in [place]  Date  12/15/17  Instruction Review Code  2- Demonstrated Understanding      Functional Fitness:  -Group instruction provided by verbal instruction, demonstration, patient participation, and written materials to support subject.  Instructors  address safety measures for doing things around the house.  Discuss how to get up and down off  the floor, how to pick things up properly, how to safely get out of a chair without assistance, and balance training. Flowsheet Row CARDIAC REHAB PHASE II EXERCISE from 01/19/2018 in [place]  Date  01/14/18  Educator  EP  Instruction Review Code  2- Demonstrated Understanding      Meditation and Mindfulness:  -Group instruction provided by verbal instruction, patient participation, and written materials to support subject.  Instructor addresses importance of mindfulness and meditation practice to help reduce stress and improve awareness.  Instructor also leads participants through a meditation exercise.  Flowsheet Row CARDIAC REHAB PHASE II EXERCISE from 01/19/2018 in [place]  Date  01/05/18  Instruction Review Code  2- Demonstrated Understanding      Stretching for Flexibility and Mobility:  -Group instruction provided by verbal instruction, patient participation, and written materials to support subject.  Instructors lead participants through series of stretches that are designed to increase flexibility thus improving mobility.  These stretches are additional exercise for major muscle groups that are typically performed during regular warm up and cool down.   Hands Only CPR:  -Group verbal, video, and participation provides a basic overview of AHA guidelines for community CPR. Role-play of emergencies allow participants the opportunity to practice calling for help and chest compression technique with discussion of AED use.   Hypertension: -Group verbal and written instruction that provides a basic overview of hypertension including the most recent diagnostic guidelines, risk factor reduction with self-care instructions and medication management. Flowsheet Row CARDIAC REHAB PHASE II EXERCISE from 01/19/2018 in [place]  Date  12/24/17  Instruction Review Code  2- Demonstrated  Understanding       Nutrition I class: Heart Healthy Eating:  -Group instruction provided by PowerPoint slides, verbal discussion, and written materials to support subject matter. The instructor gives an explanation and review of the Therapeutic Lifestyle Changes diet recommendations, which includes a discussion on lipid goals, dietary fat, sodium, fiber, plant stanol/sterol esters, sugar, and the components of a well-balanced, healthy diet. Flowsheet Row CARDIAC REHAB PHASE II EXERCISE from 07/10/2016 in [place]  Date  05/22/16  Educator  RD  Instruction Review Code (Retired)  Not applicable [employer] handouts given]      Nutrition II class: Lifestyle Skills:  -Group instruction provided by [employer], verbal discussion, and written materials to support subject matter. The instructor gives an explanation and review of label reading, grocery shopping for heart health, heart healthy recipe modifications, and ways to make healthier choices when eating out. Flowsheet Row CARDIAC REHAB PHASE II EXERCISE from 07/10/2016 in [place]  Date  05/22/16  Educator  RD  Instruction Review Code (Retired)  Not applicable [employer] handouts given]      Diabetes Question & Answer:  -Group instruction provided by PowerPoint slides, verbal discussion, and written materials to support subject matter. The instructor gives an explanation and review of diabetes co-morbidities, pre- and post-prandial blood glucose goals, pre-exercise blood glucose goals, signs, symptoms, and treatment of hypoglycemia and hyperglycemia, and foot care basics. Flowsheet Row CARDIAC REHAB PHASE II EXERCISE from 01/19/2018 in [place]  Date  12/31/17  Educator  RD  Instruction Review Code  2- Demonstrated Understanding      Diabetes Blitz:  -Group  instruction provided by PowerPoint slides, verbal discussion, and written  materials to support subject matter. The instructor gives an explanation and review of the physiology behind type 1 and type 2 diabetes, diabetes medications and rational behind using different medications, pre- and post-prandial blood glucose recommendations and Hemoglobin A1c goals, diabetes diet, and exercise including blood glucose guidelines for exercising safely.  Flowsheet Row CARDIAC REHAB PHASE II EXERCISE from 07/10/2016 in [place]  Date  05/22/16  Educator  RD  Instruction Review Code (Retired)  Not applicable [employer] handouts given]      Portion Distortion:  -Group instruction provided by [employer], verbal discussion, written materials, and food models to support subject matter. The instructor gives an explanation of serving size versus portion size, changes in portions sizes over the last 20 years, and what consists of a serving from each food group. Flowsheet Row CARDIAC REHAB PHASE II EXERCISE from 01/19/2018 in [place]  Date  01/12/18  Educator  RD  Instruction Review Code  2- Demonstrated Understanding      Stress Management:  -Group instruction provided by verbal instruction, video, and written materials to support subject matter.  Instructors review role of stress in heart disease and how to cope with stress positively.   Flowsheet Row CARDIAC REHAB PHASE II EXERCISE from 01/19/2018 in [place]  Date  01/19/18  Educator  RN  Instruction Review Code  2- Demonstrated Understanding      Exercising on Your Own:  -Group instruction provided by verbal instruction, power point, and written materials to support subject.  Instructors discuss benefits of exercise, components of exercise, frequency and intensity of exercise, and end points for exercise.  Also discuss use of nitroglycerin and activating EMS.  Review options of places to exercise outside of rehab.  Review  guidelines for sex with heart disease. Flowsheet Row CARDIAC REHAB PHASE II EXERCISE from 01/19/2018 in [place]  Date  12/22/17  Educator  EP  Instruction Review Code  2- Demonstrated Understanding      Cardiac Drugs I:  -Group instruction provided by verbal instruction and written materials to support subject.  Instructor reviews cardiac drug classes: antiplatelets, anticoagulants, beta blockers, and statins.  Instructor discusses reasons, side effects, and lifestyle considerations for each drug class. Flowsheet Row CARDIAC REHAB PHASE II EXERCISE from 01/19/2018 in [place]  Date  12/29/17  Instruction Review Code  2- Demonstrated Understanding      Cardiac Drugs II:  -Group instruction provided by verbal instruction and written materials to support subject.  Instructor reviews cardiac drug classes: angiotensin converting enzyme inhibitors (ACE-I), angiotensin II receptor blockers (ARBs), nitrates, and calcium channel blockers.  Instructor discusses reasons, side effects, and lifestyle considerations for each drug class. Flowsheet Row CARDIAC REHAB PHASE II EXERCISE from 01/19/2018 in [place]  Date  12/01/17  Instruction Review Code  2- Demonstrated Understanding      Anatomy and Physiology of the Circulatory System:  Group verbal and written instruction and models provide basic cardiac anatomy and physiology, with the coronary electrical and arterial systems. Review of: AMI, Angina, Valve disease, Heart Failure, Peripheral Artery Disease, Cardiac Arrhythmia, Pacemakers, and the ICD.   Other Education:  -Group or individual verbal, written, or video instructions that support the educational goals of the cardiac rehab program.   Holiday Eating Survival Tips:  -Group instruction provided by  PowerPoint slides, verbal discussion, and written materials to support subject matter. The  instructor gives patients tips, tricks, and techniques to help them not only survive but enjoy the holidays despite the onslaught of food that accompanies the holidays.   Knowledge Questionnaire Score: Knowledge Questionnaire Score - 11/18/17 1047    Knowledge Questionnaire Score          Pre Score  22/24           Core Components/Risk Factors/Patient Goals at Admission: Personal Goals and Risk Factors at Admission - 11/18/17 1038    Core Components/Risk Factors/Patient Goals on Admission           Weight Management  Yes;Weight Loss    Intervention  Weight Management: Develop a combined nutrition and exercise program designed to reach desired caloric intake, while maintaining appropriate intake of nutrient and fiber, sodium and fats, and appropriate energy expenditure required for the weight goal.;Weight Management: Provide education and appropriate resources to help participant work on and attain dietary goals.;Weight Management/Obesity: Establish reasonable short term and long term weight goals.;Obesity: Provide education and appropriate resources to help participant work on and attain dietary goals.    Admit Weight  211 lb (95.7 kg)    Goal Weight: Short Term  208 lb (94.3 kg)    Goal Weight: Long Term  199 lb (90.3 kg)    Expected Outcomes  Short Term: Continue to assess and modify interventions until short term weight is achieved;Long Term: Adherence to nutrition and physical activity/exercise program aimed toward attainment of established weight goal;Weight Loss: Understanding of general recommendations for a balanced deficit meal plan, which promotes 1-2 lb weight loss per week and includes a negative energy balance of [phone] kcal/d;Understanding recommendations for meals to include 15-35% energy as protein, 25-35% energy from fat, 35-60% energy from carbohydrates, less than 200mg  of dietary cholesterol, 20-35 gm of total fiber daily;Understanding of distribution of calorie intake  throughout the day with the consumption of 4-5 meals/snacks    Diabetes  Yes    Intervention  Provide education about signs/symptoms and action to take for hypo/hyperglycemia.;Provide education about proper nutrition, including hydration, and aerobic/resistive exercise prescription along with prescribed medications to achieve blood glucose in normal ranges: Fasting glucose 65-99 mg/dL    Expected Outcomes  Short Term: Participant verbalizes understanding of the signs/symptoms and immediate care of hyper/hypoglycemia, proper foot care and importance of medication, aerobic/resistive exercise and nutrition plan for blood glucose control.;Long Term: Attainment of HbA1C < 7%.    Hypertension  Yes    Intervention  Provide education on lifestyle modifcations including regular physical activity/exercise, weight management, moderate sodium restriction and increased consumption of fresh fruit, vegetables, and low fat dairy, alcohol moderation, and smoking cessation.;Monitor prescription use compliance.    Expected Outcomes  Short Term: Continued assessment and intervention until BP is < 140/25mm HG in hypertensive participants. < 130/18mm HG in hypertensive participants with diabetes, heart failure or chronic kidney disease.;Long Term: Maintenance of blood pressure at goal levels.    Lipids  Yes    Intervention  Provide education and support for participant on nutrition & aerobic/resistive exercise along with prescribed medications to achieve LDL 70mg , HDL >40mg .    Expected Outcomes  Short Term: Participant states understanding of desired cholesterol values and is compliant with medications prescribed. Participant is following exercise prescription and nutrition guidelines.;Long Term: Cholesterol controlled with medications as prescribed, with individualized exercise RX and with personalized nutrition plan. Value goals: LDL < 70mg , HDL > 40 mg.  Core Components/Risk Factors/Patient Goals Review:   Goals and Risk Factor Review    Core Components/Risk Factors/Patient Goals Review    Row Name 12/08/17 1147 12/17/17 0936 12/31/17 1153 02/04/18 1644   Personal Goals Review  Other;Weight Management/Obesity;Diabetes;Hypertension;Lipids;Stress  no documentation  Other;Weight Management/Obesity;Diabetes;Hypertension;Lipids  Other;Weight Management/Obesity;Diabetes;Hypertension;Lipids   Review  pt with multiiple CAD RF demonstrates eagerness to particiate in CR program at this time.   pt concerned about continued fatigue and decreased stamina.  pt plans to hold statin for 5 days per nephrologist direction.  pt would like to resume testoserone.    pt concerned about continued fatigue and decreased stamina.  pt reports improved muscle aches with holding statin.  pt interested in [facility].   pt advised appropriate to resume testoterone treatments per Dr. Aundra Dubin.  pt discouraged with persistent fatigue worse with high humidity,  however hopeful his efforts will improve strength/stamina.  pt concerned about continued fatigue and decreased stamina.  pt reports improved muscle aches with holding statin.  pt interested in [facility].   pt advised appropriate to resume testoterone treatments per Dr. Aundra Dubin.  pt discouraged with persistent fatigue worse with high humidity,  however hopeful his efforts will improve strength/stamina.   Expected Outcomes  pt will participatein CR exercise, nutrition and lifestyle modification education to decrease overall RF.   pt will participatein CR exercise, nutrition and lifestyle modification education to decrease overall RF.   pt will participatein CR exercise, nutrition and lifestyle modification education to decrease overall RF.   pt will participatein CR exercise, nutrition and lifestyle modification education to decrease overall RF.           Core Components/Risk Factors/Patient Goals at Discharge (Final Review):  Goals and Risk Factor Review - 02/04/18 1644    Core  Components/Risk Factors/Patient Goals Review          Personal Goals Review  Other;Weight Management/Obesity;Diabetes;Hypertension;Lipids    Review  pt concerned about continued fatigue and decreased stamina.  pt reports improved muscle aches with holding statin.  pt interested in [facility].   pt advised appropriate to resume testoterone treatments per Dr. Aundra Dubin.  pt discouraged with persistent fatigue worse with high humidity,  however hopeful his efforts will improve strength/stamina.    Expected Outcomes  pt will participatein CR exercise, nutrition and lifestyle modification education to decrease overall RF.            ITP Comments: ITP Comments    Row Name 11/18/17 0919 12/08/17 1145 12/31/17 1152 01/03/18 1702 02/04/18 1644   ITP Comments  Dr. Fransico Him, Medical Director  30 day ITP review. pt rcently transferred to 8:15 group exercise class. pt demonstrates eagerness to participate in program at this time.   30 day ITP review. pt tolerating group exercise without difficulty.  pt demonstrates eagerness to participate in program at this time.   30 day ITP review. pt tolerating group exercise without difficulty.  pt demonstrates eagerness to participate in program at this time.   30 day ITP review. pt tolerating group exercise without difficulty.  pt with frequent absences for unknown reason from CR.       Comments:

## 2018-02-07 ENCOUNTER — Encounter (HOSPITAL_COMMUNITY): Payer: Medicare Other

## 2018-02-07 ENCOUNTER — Ambulatory Visit (HOSPITAL_COMMUNITY): Payer: Medicare Other

## 2018-02-07 DIAGNOSIS — H04123 Dry eye syndrome of bilateral lacrimal glands: Secondary | ICD-10-CM | POA: Diagnosis not present

## 2018-02-07 DIAGNOSIS — H26493 Other secondary cataract, bilateral: Secondary | ICD-10-CM | POA: Diagnosis not present

## 2018-02-07 DIAGNOSIS — E119 Type 2 diabetes mellitus without complications: Secondary | ICD-10-CM | POA: Diagnosis not present

## 2018-02-07 DIAGNOSIS — Z961 Presence of intraocular lens: Secondary | ICD-10-CM | POA: Diagnosis not present

## 2018-02-08 DIAGNOSIS — I2510 Atherosclerotic heart disease of native coronary artery without angina pectoris: Secondary | ICD-10-CM | POA: Diagnosis not present

## 2018-02-08 DIAGNOSIS — I129 Hypertensive chronic kidney disease with stage 1 through stage 4 chronic kidney disease, or unspecified chronic kidney disease: Secondary | ICD-10-CM | POA: Diagnosis not present

## 2018-02-08 DIAGNOSIS — E1122 Type 2 diabetes mellitus with diabetic chronic kidney disease: Secondary | ICD-10-CM | POA: Diagnosis not present

## 2018-02-08 DIAGNOSIS — N179 Acute kidney failure, unspecified: Secondary | ICD-10-CM | POA: Diagnosis not present

## 2018-02-08 DIAGNOSIS — E669 Obesity, unspecified: Secondary | ICD-10-CM | POA: Diagnosis not present

## 2018-02-08 DIAGNOSIS — N183 Chronic kidney disease, stage 3 (moderate): Secondary | ICD-10-CM | POA: Diagnosis not present

## 2018-02-08 DIAGNOSIS — I509 Heart failure, unspecified: Secondary | ICD-10-CM | POA: Diagnosis not present

## 2018-02-08 DIAGNOSIS — N2581 Secondary hyperparathyroidism of renal origin: Secondary | ICD-10-CM | POA: Diagnosis not present

## 2018-02-08 DIAGNOSIS — E785 Hyperlipidemia, unspecified: Secondary | ICD-10-CM | POA: Diagnosis not present

## 2018-02-08 DIAGNOSIS — Z9889 Other specified postprocedural states: Secondary | ICD-10-CM | POA: Diagnosis not present

## 2018-02-08 DIAGNOSIS — I4891 Unspecified atrial fibrillation: Secondary | ICD-10-CM | POA: Diagnosis not present

## 2018-02-08 DIAGNOSIS — D631 Anemia in chronic kidney disease: Secondary | ICD-10-CM | POA: Diagnosis not present

## 2018-02-09 ENCOUNTER — Ambulatory Visit (HOSPITAL_COMMUNITY): Payer: Medicare Other

## 2018-02-09 ENCOUNTER — Encounter (HOSPITAL_COMMUNITY): Payer: Medicare Other

## 2018-02-11 ENCOUNTER — Encounter (HOSPITAL_COMMUNITY): Payer: Medicare Other

## 2018-02-11 ENCOUNTER — Ambulatory Visit (HOSPITAL_COMMUNITY): Payer: Medicare Other

## 2018-02-14 ENCOUNTER — Encounter (HOSPITAL_COMMUNITY): Payer: Medicare Other

## 2018-02-14 ENCOUNTER — Ambulatory Visit (HOSPITAL_COMMUNITY): Payer: Medicare Other

## 2018-02-15 ENCOUNTER — Other Ambulatory Visit: Payer: Self-pay | Admitting: Nephrology

## 2018-02-15 DIAGNOSIS — N183 Chronic kidney disease, stage 3 unspecified: Secondary | ICD-10-CM

## 2018-02-16 ENCOUNTER — Encounter (HOSPITAL_COMMUNITY): Payer: Medicare Other

## 2018-02-16 ENCOUNTER — Ambulatory Visit
Admission: RE | Admit: 2018-02-16 | Discharge: 2018-02-16 | Disposition: A | Discharge status: Home / Self Care | Payer: Medicare Other | Source: Ambulatory Visit | Attending: Nephrology | Admitting: Nephrology

## 2018-02-16 ENCOUNTER — Ambulatory Visit (HOSPITAL_COMMUNITY): Payer: Medicare Other

## 2018-02-16 DIAGNOSIS — N183 Chronic kidney disease, stage 3 unspecified: Secondary | ICD-10-CM

## 2018-02-18 ENCOUNTER — Encounter (HOSPITAL_COMMUNITY): Payer: Medicare Other

## 2018-02-18 ENCOUNTER — Ambulatory Visit (HOSPITAL_COMMUNITY): Payer: Medicare Other

## 2018-02-21 ENCOUNTER — Encounter (HOSPITAL_COMMUNITY): Payer: Medicare Other

## 2018-02-21 ENCOUNTER — Ambulatory Visit (HOSPITAL_COMMUNITY): Payer: Medicare Other

## 2018-02-21 DIAGNOSIS — L0390 Cellulitis, unspecified: Secondary | ICD-10-CM | POA: Diagnosis not present

## 2018-02-23 ENCOUNTER — Ambulatory Visit (HOSPITAL_COMMUNITY): Payer: Medicare Other

## 2018-02-23 ENCOUNTER — Encounter (HOSPITAL_COMMUNITY): Payer: Medicare Other

## 2018-02-23 DIAGNOSIS — M79672 Pain in left foot: Secondary | ICD-10-CM | POA: Diagnosis not present

## 2018-02-23 DIAGNOSIS — L03116 Cellulitis of left lower limb: Secondary | ICD-10-CM | POA: Diagnosis not present

## 2018-02-23 DIAGNOSIS — N183 Chronic kidney disease, stage 3 (moderate): Secondary | ICD-10-CM | POA: Diagnosis not present

## 2018-02-25 ENCOUNTER — Ambulatory Visit (HOSPITAL_COMMUNITY): Payer: Medicare Other

## 2018-02-28 ENCOUNTER — Ambulatory Visit (HOSPITAL_COMMUNITY): Payer: Medicare Other

## 2018-02-28 NOTE — Addendum Note (Signed)
Encounter addended by: Dorna Bloom D on: 02/28/2018 10:47 AM  Actions taken: Flowsheet data copied forward, Visit Navigator Flowsheet section accepted

## 2018-03-01 DIAGNOSIS — L03116 Cellulitis of left lower limb: Secondary | ICD-10-CM | POA: Insufficient documentation

## 2018-03-01 NOTE — Addendum Note (Signed)
Encounter addended by: Ivonne Andrew, RD on: 03/01/2018 2:06 PM  Actions taken: Flowsheet data copied forward, Visit Navigator Flowsheet section accepted

## 2018-03-01 NOTE — Telephone Encounter (Signed)
Spoke [facility] - [facility] he [facility] and that they cannot find the chart records that were faxed to them at their request either. [facility] and will [facility] all of this to pt again so that he can bring it in in person to the [place].

## 2018-03-02 ENCOUNTER — Ambulatory Visit (HOSPITAL_COMMUNITY): Payer: Medicare Other

## 2018-03-03 NOTE — Addendum Note (Signed)
Encounter addended by: Lowell Guitar, RN on: 03/03/2018 4:54 PM  Actions taken: Episode resolved

## 2018-03-03 NOTE — Addendum Note (Signed)
Encounter addended by: Lowell Guitar, RN on: 03/03/2018 4:53 PM  Actions taken: Flowsheet data copied forward, Visit Navigator Flowsheet section accepted, Sign clinical note

## 2018-03-03 NOTE — Progress Notes (Signed)
Discharge Progress Report  Patient Details  Name: [name] MRN: [id number] Date of Birth: 05-22-1946 Referring Provider:   Flowsheet Row CARDIAC REHAB PHASE II ORIENTATION from 11/18/2017 in [facility] [place]  Referring Provider  [name] MD       Number of Visits: 23   Reason for Discharge:  Early Exit:  Lack of attendance  Smoking History:  Social History   Tobacco Use  Smoking Status Never Smoker  Smokeless Tobacco Never Used    Diagnosis:  03/25/16 S/P MVR (mitral valve repair)  Stable angina ([place])  ADL [facility]:   Initial Exercise Prescription: Initial Exercise Prescription - 11/18/17 1100    Date of Initial Exercise RX and Referring Provider          Date  11/18/17    Referring Provider  [name] MD        Treadmill          MPH  2.8    Grade  0    Minutes  10    METs  3.14        Bike          Level  1    Minutes  10    METs  2.99        NuStep          Level  4    SPM  70    Minutes  10    METs  2.6        Prescription Details          Frequency (times per week)  3x    Duration  Progress to 30 minutes of continuous aerobic without signs/symptoms of physical distress        Intensity          THRR 40-80% of Max Heartrate  59-118    Ratings of Perceived Exertion  11-15    Perceived Dyspnea  0-4        Progression          Progression  Continue progressive overload as per policy without signs/symptoms or physical distress.        Resistance Training          Training Prescription  Yes    Weight  3lbs    Reps  10-15           Discharge Exercise Prescription (Final Exercise Prescription Changes): Exercise Prescription Changes - 01/19/18 1123    Response to Exercise          Blood Pressure (Admit)  118/70    Blood Pressure (Exercise)  128/60    Blood Pressure (Exit)  114/64    Heart Rate (Admit)  73 bpm    Heart Rate (Exercise)  105 bpm    Heart Rate (Exit)  67 bpm    Rating  of Perceived Exertion (Exercise)  11    Perceived Dyspnea (Exercise)  0    Symptoms  None    Duration  Continue with 30 min of aerobic exercise without signs/symptoms of physical distress.    Intensity  THRR unchanged        Progression          Progression  Continue to progress workloads to maintain intensity without signs/symptoms of physical distress.    Average METs  3.8        Resistance Training          Training Prescription  No relaxation day  Time  10 Minutes        Interval Training          Interval Training  No        Treadmill          MPH  3    Grade  3    Minutes  10    METs  4.54        Bike          Level  1.3    Minutes  10    METs  3.59        NuStep          Level  5    SPM  90    Minutes  10    METs  3.2        Home Exercise Plan          Plans to continue exercise at  [employer] (comment) [employer]    Frequency  Add 3 additional days to program exercise sessions.    Initial Home Exercises Provided  12/06/17           Functional Capacity: 6 Minute Walk    6 Minute Walk    Row Name 11/18/17 1039   Phase  Initial   Distance  1723 feet   Distance % Change  0 %   Distance Feet Change  0 ft   Walk Time  6 minutes   # of Rest Breaks  0   MPH  3.26   METS  3.31   RPE  13   Perceived Dyspnea   0   VO2 Peak  11.58   Symptoms  Yes (comment)   Comments  Right Toe Discomfort +5/10   Resting HR  64 bpm   Resting BP  118/62   Resting Oxygen Saturation   97 %   Exercise Oxygen Saturation  during 6 min walk  100 %   Max Ex. HR  100 bpm   Max Ex. BP  128/70   2 Minute Post BP  118/70          Psychological, QOL, Others - Outcomes: PHQ 2/9: Depression screen [facility] 2/9 11/22/2017 08/21/2016 05/11/2016 02/14/2014  Decreased Interest 0 0 0 0  Down, Depressed, Hopeless 0 0 0 0  PHQ - 2 Score 0 0 0 0  Some recent data might be hidden    Quality of Life: Quality of Life - 11/18/17 1047    Quality of Life  Scores          Health/Function Pre  20.23 %    Socioeconomic Pre  29.25 %    Psych/Spiritual Pre  30 %    Family Pre  19.2 %    GLOBAL Pre  24.1 %           Personal Goals: Goals established at orientation with interventions provided to work toward goal. Personal Goals and Risk Factors at Admission - 11/18/17 1038    Core Components/Risk Factors/Patient Goals on Admission           Weight Management  Yes;Weight Loss    Intervention  Weight Management: Develop a combined nutrition and exercise program designed to reach desired caloric intake, while maintaining appropriate intake of nutrient and fiber, sodium and fats, and appropriate energy expenditure required for the weight goal.;Weight Management: Provide education and appropriate resources to help participant work on and attain dietary goals.;Weight Management/Obesity: Establish reasonable short term and long term weight goals.;Obesity:  Provide education and appropriate resources to help participant work on and attain dietary goals.    Admit Weight  211 lb (95.7 kg)    Goal Weight: Short Term  208 lb (94.3 kg)    Goal Weight: Long Term  199 lb (90.3 kg)    Expected Outcomes  Short Term: Continue to assess and modify interventions until short term weight is achieved;Long Term: Adherence to nutrition and physical activity/exercise program aimed toward attainment of established weight goal;Weight Loss: Understanding of general recommendations for a balanced deficit meal plan, which promotes 1-2 lb weight loss per week and includes a negative energy balance of [phone] kcal/d;Understanding recommendations for meals to include 15-35% energy as protein, 25-35% energy from fat, 35-60% energy from carbohydrates, less than 200mg  of dietary cholesterol, 20-35 gm of total fiber daily;Understanding of distribution of calorie intake throughout the day with the consumption of 4-5 meals/snacks    Diabetes  Yes    Intervention  Provide education about  signs/symptoms and action to take for hypo/hyperglycemia.;Provide education about proper nutrition, including hydration, and aerobic/resistive exercise prescription along with prescribed medications to achieve blood glucose in normal ranges: Fasting glucose 65-99 mg/dL    Expected Outcomes  Short Term: Participant verbalizes understanding of the signs/symptoms and immediate care of hyper/hypoglycemia, proper foot care and importance of medication, aerobic/resistive exercise and nutrition plan for blood glucose control.;Long Term: Attainment of HbA1C < 7%.    Hypertension  Yes    Intervention  Provide education on lifestyle modifcations including regular physical activity/exercise, weight management, moderate sodium restriction and increased consumption of fresh fruit, vegetables, and low fat dairy, alcohol moderation, and smoking cessation.;Monitor prescription use compliance.    Expected Outcomes  Short Term: Continued assessment and intervention until BP is < 140/12mm HG in hypertensive participants. < 130/38mm HG in hypertensive participants with diabetes, heart failure or chronic kidney disease.;Long Term: Maintenance of blood pressure at goal levels.    Lipids  Yes    Intervention  Provide education and support for participant on nutrition & aerobic/resistive exercise along with prescribed medications to achieve LDL 70mg , HDL >40mg .    Expected Outcomes  Short Term: Participant states understanding of desired cholesterol values and is compliant with medications prescribed. Participant is following exercise prescription and nutrition guidelines.;Long Term: Cholesterol controlled with medications as prescribed, with individualized exercise RX and with personalized nutrition plan. Value goals: LDL < 70mg , HDL > 40 mg.            Personal Goals Discharge: Goals and Risk Factor Review    Core Components/Risk Factors/Patient Goals Review    Row Name 12/08/17 1147 12/17/17 0936 12/31/17 1153 02/04/18  1644 03/03/18 1650   Personal Goals Review  Other;Weight Management/Obesity;Diabetes;Hypertension;Lipids;Stress  no documentation  Other;Weight Management/Obesity;Diabetes;Hypertension;Lipids  Other;Weight Management/Obesity;Diabetes;Hypertension;Lipids  Other;Weight Management/Obesity;Diabetes;Hypertension;Lipids   Review  pt with multiiple CAD RF demonstrates eagerness to particiate in CR program at this time.   pt concerned about continued fatigue and decreased stamina.  pt plans to hold statin for 5 days per nephrologist direction.  pt would like to resume testoserone.    pt concerned about continued fatigue and decreased stamina.  pt reports improved muscle aches with holding statin.  pt interested in [facility].   pt advised appropriate to resume testoterone treatments per Dr. Aundra Dubin.  pt discouraged with persistent fatigue worse with high humidity,  however hopeful his efforts will improve strength/stamina.  pt concerned about continued fatigue and decreased stamina.  pt reports improved muscle aches with holding statin.  pt interested in [facility].   pt advised appropriate to resume testoterone treatments per Dr. Aundra Dubin.  pt discouraged with persistent fatigue worse with high humidity,  however hopeful his efforts will improve strength/stamina.  pt discharged from program for nonattendance.    Expected Outcomes  pt will participatein CR exercise, nutrition and lifestyle modification education to decrease overall RF.   pt will participatein CR exercise, nutrition and lifestyle modification education to decrease overall RF.   pt will participatein CR exercise, nutrition and lifestyle modification education to decrease overall RF.   pt will participatein CR exercise, nutrition and lifestyle modification education to decrease overall RF.   pt will participate in exercise, nutrition and lifestyle modification activities in the community.           Exercise Goals and Review: Exercise Goals     Exercise Goals    Row Name 11/18/17 0921   Increase Physical Activity  Yes   Intervention  Provide advice, education, support and counseling about physical activity/exercise needs.;Develop an individualized exercise prescription for aerobic and resistive training based on initial evaluation findings, risk stratification, comorbidities and participant's personal goals.   Expected Outcomes  Short Term: Attend rehab on a regular basis to increase amount of physical activity.;Long Term: Exercising regularly at least 3-5 days a week.;Long Term: Add in home exercise to make exercise part of routine and to increase amount of physical activity.   Increase Strength and Stamina  Yes Develop exercise routine to get back to gym. Also increase energy levels.   Intervention  Provide advice, education, support and counseling about physical activity/exercise needs.;Develop an individualized exercise prescription for aerobic and resistive training based on initial evaluation findings, risk stratification, comorbidities and participant's personal goals.   Expected Outcomes  Short Term: Increase workloads from initial exercise prescription for resistance, speed, and METs.;Short Term: Perform resistance training exercises routinely during rehab and add in resistance training at home;Long Term: Improve cardiorespiratory fitness, muscular endurance and strength as measured by increased METs and functional capacity (6MWT)   Able to understand and use rate of perceived exertion (RPE) scale  Yes   Intervention  Provide education and explanation on how to use RPE scale   Expected Outcomes  Short Term: Able to use RPE daily in rehab to express subjective intensity level;Long Term:  Able to use RPE to guide intensity level when exercising independently   Knowledge and understanding of Target Heart Rate Range (THRR)  Yes   Intervention  Provide education and explanation of THRR including how the numbers were predicted and where they  are located for reference   Expected Outcomes  Short Term: Able to state/look up THRR;Long Term: Able to use THRR to govern intensity when exercising independently   Able to check pulse independently  Yes   Intervention  Provide education and demonstration on how to check pulse in carotid and radial arteries.;Review the importance of being able to check your own pulse for safety during independent exercise   Expected Outcomes  Short Term: Able to explain why pulse checking is important during independent exercise;Long Term: Able to check pulse independently and accurately   Understanding of Exercise Prescription  Yes   Intervention  Provide education, explanation, and written materials on patient's individual exercise prescription   Expected Outcomes  Short Term: Able to explain program exercise prescription;Long Term: Able to explain home exercise prescription to exercise independently          Nutrition & Weight - Outcomes: Pre Biometrics - 11/18/17  1046    Pre Biometrics          Height  5\' 9"  (1.753 m)    Weight  211 lb 13.8 oz (96.1 kg)    Waist Circumference  43.5 inches    Hip Circumference  42 inches    Waist to Hip Ratio  1.04 %    BMI (Calculated)  31.27    Triceps Skinfold  10 mm    % Body Fat  28.9 %    Grip Strength  46 kg    Flexibility  9.5 in    Single Leg Stand  5 seconds          Post Biometrics - 02/28/18 1026     Post  Biometrics          Weight  218 lb 7.6 oz (99.1 kg)           Nutrition: Nutrition Therapy & Goals - 11/18/17 1123    Nutrition Therapy          Diet  Carb Modified, Therapeutic Lifestyle Changes        Personal Nutrition Goals          Nutrition Goal  1-2 lb wt loss/week to a wt loss goal of 6-24 lb at graduation from [place] Goal #2  Improved glycemic control as evidenced by an A1c trending from 7.2 toward less than 7        Intervention Plan          Intervention  Prescribe, educate and counsel regarding  individualized specific dietary modifications aiming towards targeted core components such as weight, hypertension, lipid management, diabetes, heart failure and other comorbidities.    Expected Outcomes  Short Term Goal: Understand basic principles of dietary content, such as calories, fat, sodium, cholesterol and nutrients.;Long Term Goal: Adherence to prescribed nutrition plan.           Nutrition Discharge: Nutrition Assessments - 03/01/18 1357    MEDFICTS Scores          Pre Score  17    Post Score  -- no survey available to assess at this time           Education Questionnaire Score: Knowledge Questionnaire Score - 11/18/17 1047    Knowledge Questionnaire Score          Pre Score  22/24           Goals reviewed with patient; copy given to patient.

## 2018-03-10 ENCOUNTER — Ambulatory Visit (HOSPITAL_COMMUNITY)
Admission: RE | Admit: 2018-03-10 | Discharge: 2018-03-10 | Disposition: A | Discharge status: Home / Self Care | Payer: Medicare Other | Source: Ambulatory Visit | Attending: Cardiology | Admitting: Cardiology

## 2018-03-10 ENCOUNTER — Encounter: Payer: Medicare Other | Attending: Nephrology | Admitting: Skilled Nursing Facility1

## 2018-03-10 ENCOUNTER — Encounter (HOSPITAL_COMMUNITY): Payer: Self-pay | Admitting: Cardiology

## 2018-03-10 ENCOUNTER — Encounter: Payer: Self-pay | Admitting: Skilled Nursing Facility1

## 2018-03-10 VITALS — BP 140/79 | HR 65 | Wt 216.0 lb

## 2018-03-10 DIAGNOSIS — N179 Acute kidney failure, unspecified: Secondary | ICD-10-CM | POA: Diagnosis not present

## 2018-03-10 DIAGNOSIS — E782 Mixed hyperlipidemia: Secondary | ICD-10-CM | POA: Insufficient documentation

## 2018-03-10 DIAGNOSIS — I130 Hypertensive heart and chronic kidney disease with heart failure and stage 1 through stage 4 chronic kidney disease, or unspecified chronic kidney disease: Secondary | ICD-10-CM | POA: Diagnosis not present

## 2018-03-10 DIAGNOSIS — I129 Hypertensive chronic kidney disease with stage 1 through stage 4 chronic kidney disease, or unspecified chronic kidney disease: Secondary | ICD-10-CM | POA: Diagnosis not present

## 2018-03-10 DIAGNOSIS — I25810 Atherosclerosis of coronary artery bypass graft(s) without angina pectoris: Secondary | ICD-10-CM | POA: Diagnosis not present

## 2018-03-10 DIAGNOSIS — Z794 Long term (current) use of insulin: Secondary | ICD-10-CM | POA: Diagnosis not present

## 2018-03-10 DIAGNOSIS — N183 Chronic kidney disease, stage 3 (moderate): Secondary | ICD-10-CM | POA: Diagnosis not present

## 2018-03-10 DIAGNOSIS — Z6831 Body mass index (BMI) 31.0-31.9, adult: Secondary | ICD-10-CM | POA: Insufficient documentation

## 2018-03-10 DIAGNOSIS — E669 Obesity, unspecified: Secondary | ICD-10-CM | POA: Insufficient documentation

## 2018-03-10 DIAGNOSIS — I5032 Chronic diastolic (congestive) heart failure: Secondary | ICD-10-CM | POA: Diagnosis not present

## 2018-03-10 DIAGNOSIS — G4733 Obstructive sleep apnea (adult) (pediatric): Secondary | ICD-10-CM | POA: Diagnosis not present

## 2018-03-10 DIAGNOSIS — Z9889 Other specified postprocedural states: Secondary | ICD-10-CM | POA: Diagnosis not present

## 2018-03-10 DIAGNOSIS — E1122 Type 2 diabetes mellitus with diabetic chronic kidney disease: Secondary | ICD-10-CM | POA: Diagnosis not present

## 2018-03-10 DIAGNOSIS — Z79899 Other long term (current) drug therapy: Secondary | ICD-10-CM | POA: Diagnosis not present

## 2018-03-10 DIAGNOSIS — Z713 Dietary counseling and surveillance: Secondary | ICD-10-CM | POA: Insufficient documentation

## 2018-03-10 DIAGNOSIS — I480 Paroxysmal atrial fibrillation: Secondary | ICD-10-CM | POA: Diagnosis not present

## 2018-03-10 DIAGNOSIS — Z7901 Long term (current) use of anticoagulants: Secondary | ICD-10-CM | POA: Insufficient documentation

## 2018-03-10 DIAGNOSIS — Z951 Presence of aortocoronary bypass graft: Secondary | ICD-10-CM | POA: Diagnosis not present

## 2018-03-10 DIAGNOSIS — I25119 Atherosclerotic heart disease of native coronary artery with unspecified angina pectoris: Secondary | ICD-10-CM | POA: Diagnosis not present

## 2018-03-10 DIAGNOSIS — E785 Hyperlipidemia, unspecified: Secondary | ICD-10-CM | POA: Insufficient documentation

## 2018-03-10 DIAGNOSIS — Z8673 Personal history of transient ischemic attack (TIA), and cerebral infarction without residual deficits: Secondary | ICD-10-CM | POA: Diagnosis not present

## 2018-03-10 LAB — BASIC METABOLIC PANEL
Anion gap: 9 (ref 5–15)
BUN: 25 mg/dL — ABNORMAL HIGH (ref 8–23)
CO2: 28 mmol/L (ref 22–32)
CREATININE: 3.66 mg/dL — AB (ref 0.61–1.24)
Calcium: 8.9 mg/dL (ref 8.9–10.3)
Chloride: 100 mmol/L (ref 98–111)
GFR calc non Af Amer: 15 mL/min — ABNORMAL LOW (ref >60)
GFR, EST AFRICAN AMERICAN: 18 mL/min — AB (ref >60)
GLUCOSE: 180 mg/dL — AB (ref 70–99)
Potassium: 4.1 mmol/L (ref 3.5–5.1)
Sodium: 137 mmol/L (ref 135–145)

## 2018-03-10 LAB — CBC
HCT: 37.6 % — ABNORMAL LOW (ref 39.0–52.0)
Hemoglobin: 12.7 g/dL — ABNORMAL LOW (ref 13.0–17.0)
MCH: 32.6 pg (ref 26.0–34.0)
MCHC: 33.8 g/dL (ref 30.0–36.0)
MCV: 96.4 fL (ref 78.0–100.0)
PLATELETS: 243 10*3/uL (ref 150–400)
RBC: 3.9 MIL/uL — AB (ref 4.22–5.81)
RDW: 12.5 % (ref 11.5–15.5)
WBC: 8.9 10*3/uL (ref 4.0–10.5)

## 2018-03-10 NOTE — Progress Notes (Signed)
Patient ID: [name], male   DOB: 04-Sep-1945, 72 y.o.   MRN: [id number] PCP: Dr Harrington Challenger Cardiology: Dr. Aundra Dubin  72 y.o. with history of CAD s/p CABG and obesity s/p lap banding procedure presents for cardiology followup.  Given exertional dyspnea and chest tightness, he had LHC in 1/15.  This showed no change from prior.  Grafts were patent and there was severe stenosis in distal D1, not amenable to PCI.   In 2017, he developed increased exertional dyspnea.  Eventually had a TEE showing severe MR with prolapse of a portion of the anterior mitral valve leaflet.  Coronary angiography in 6/17 showed patent grafts and 90% stenosis of distal diagonal not amenable to intervention.  In 8/17, he had mitral valve repair.  Post-op diastolic CHF, Lasix started and increased.   He had a cath again in 9/18 showing stable coronary anatomy.    He developed atrial arrhythmias in 12/18.  Both fibrillation and flutter were seen.  Saw Dr. Rayann Heman, recommended DCCV with amiodarone or sotalol if fibrillation recurs.  He had DCCV in 1/19.   In 6/19, he was noted to have developed AKI. Creatinine went up to 3.46.  Renal US was done, showing normal kidneys. Cause of AKI was not clear.  We stopped his Lasix. He saw nephrology and eventually went back on lower dose of Lasix, 40 mg daily.  He is not using any NSAIDs.    He stopped pravastatin due to myalgias and feels much better off it, more energetic.   He returns for followup of CHF, CAD, and atrial fibrillation today.  He remains in NSR. More energy and less achiness off pravastatin. No significant exertional dyspnea. Weight down 6 lbs since last appointment.  Overall, feels pretty good.   ECG (personally reviewed): NSR, PACs  Labs (11/12): LDL 70 Labs (5/13): K 4.1, creatinine 1.1, BNP 48 Labs (12/13): K 3.4, creatinine 1.07, LDL 59, HDL 37 Labs (1/15): K 4.3, creatinine 1.2, HCT 40.3 Labs (3/15): LDL 38, HDL 81, BNP 55, K 4, creatinine 1.2 Labs (6/15): K 4.2,  creatinine 1.4, LFTs normal, LDL 66, HDL 35 Labs (8/16): K 4, creatinine 1.6, BNP 64 Labs (12/16): LDL 61, HDL 37 Labs (4/17): K 4.2, creatinine 1.35, HCT 38.7, BNP 80 Labs (8/17): K 4.2, creatinine 1.4 => 1.66, BNP 159 Labs (12/17): K 3.6, creatinine 1.75 Labs (2/18): LDL 76, HDL 36 Labs (9/18): K 3.9, creatinine 1.76 Labs (1/19): K 4.9, creatinine 1.76, hgb 14 Labs (6/19): K 3.6, creatinine 3.46  PMH: 1. OSA: on CPAP.  2. Diabetes mellitus 3. Allergic rhinitis 4. CAD: s/p CABG 1995.  LHC (10/11) with 90% distal D1 (patent proximal D1 stent), total occlusion CFX, total occlusion RCA, no significant stenoses in LAD, SVG-PDA patent, SVG-OM patent, EF 55%.  Medical management.  LHC (5/13) with patent grafts and 90% distal D1 stenosis (no significant change from 10/11).  LHC (1/15) with patent grafts, 75% ISR in D1 stent, 90% stenosis distal D1 (small caliber). D1 not amenable to intervention.  - Coronary angiography (6/17): grafts patent, 90% stenosis distally in large diagonal not amenable to intervention.   - LHC (9/18): SVG-OM and SVG-PDA patent,  75% in-stent restenosis in proximal diagonal then 90% stenosis distally (small in caliber at that point), totally occluded LCx and RCA.  Left coronary system similar to prior caths, D1 may be source of angina.  5. Obesity: lap-banding in 5/11.  6. Chronic diastolic CHF: Echo (1/32) with EF 55-60%, moderate LVH, moderate diastolic dysfunction,  s/p mitral valve repair with elevated mean gradient but normal pressure half-time.  - Echo (8/18): EF 55-60%, s/p MV repair with mean gradient 6 mmHg, PASP 28 mmHg, mildly decreased RV systolic function.  7. TIA: Carotid dopplers in 11/11 with 0-39% bilateral stenosis.  Carotid dopplers 6/17 with 1-39% RICA stenosis.  8. OA: Knees, C-spine. TKR 9/13.  9. HTN 10. Hyperlipidemia: Myalgias with atorvastatin and Crestor.  11. Diverticulosis 12. MItral regurgitation: TEE (6/17) with EF 55-60%, severe eccentric  MR, prolapse of a portion of the anterior leaflet, peak RV-RA gradient 45 mmHg.  - Mitral valve repair 8/17 13. CKD: Stage III.  14. Atrial fibrillation/atrial flutter: Both arrhythmias noted in 12/18.  DCCV 12/19.   15. AKI 8/46: uncertain etiology.  Renal US was normal.   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  Lives in [place].   Family History  Problem Relation Age of Onset  . Other Mother        joint problems  . Hypertension Father   . Heart disease Father   . CVA Father   . Depression Father   . Heart disease Sister        CABG  . Stroke Sister   . Hypertension Sister   . Congestive Heart Failure Sister   . Diabetes Sister   . Heart disease Brother   . Hypertension Brother   . Congestive Heart Failure Brother     ROS: All systems reviewed and negative except as per HPI.    Current Outpatient Medications  Medication Sig Dispense Refill  . Alirocumab (PRALUENT) 150 MG/ML SOPN Inject 150 mg into the skin every 14 (fourteen) days. 2 pen 11  . amoxicillin (AMOXIL) 500 MG tablet Take 4 tablets (2 g) by mouth 30-60 minutes prior to dental appointment (Patient taking differently: Take 2,000 mg by mouth See admin instructions. 2,000 mg 30-60 minutes prior to dental or medical procedures) 12 tablet 0  . apixaban (ELIQUIS) 5 MG TABS tablet Take 1 tablet (5 mg total) by mouth 2 (two) times daily. 180 tablet 3  . Artificial Saliva (MOUTH KOTE MT) Use as directed 4 sprays in the mouth or throat every 3 (three) hours as needed (for dry mouth).    Marland Kitchen buPROPion (WELLBUTRIN) 75 MG tablet Take 75 mg by mouth daily.    . carvedilol (COREG) 6.25 MG tablet Take 1 tablet (6.25 mg total) by mouth 2 (two) times daily with a meal. 180 tablet 3  . cetirizine (ZYRTEC) 10 MG tablet Take 10 mg by mouth daily as needed (for allergies.).     Marland Kitchen Cholecalciferol (VITAMIN D) 2000 units CAPS Take 2,000 Units by mouth daily.    . diphenhydrAMINE (BENADRYL) 25 MG tablet Take 25 mg by  mouth at bedtime as needed (for sleep.).    Marland Kitchen fluticasone (FLONASE) 50 MCG/ACT nasal spray Place 1 spray into both nostrils daily as needed for allergies or rhinitis.    Marland Kitchen HYDROcodone-acetaminophen (NORCO) 10-325 MG tablet Take 1 tablet by mouth every 4 (four) hours as needed for moderate pain. 20 tablet 0  . Hypromellose (ARTIFICIAL TEARS OP) Apply 1 drop to eye 2 (two) times daily as needed (dry eyes).    Marland Kitchen ibuprofen (ADVIL,MOTRIN) 200 MG tablet Take 400-800 mg by mouth every 8 (eight) hours as needed (for knee/back pain or arthritis pain.).    Marland Kitchen nitroGLYCERIN (NITROSTAT) 0.4 MG SL tablet DISSOLVE ONE TABLET UNDER THE TONGUE EVERY 5 MINUTES AS NEEDED FOR CHEST PAIN.  DO  NOT EXCEED A TOTAL OF 3 DOSES IN 15 MINUTES 25 tablet 3  . PARoxetine (PAXIL) 40 MG tablet Take 20 mg by mouth daily as needed (PTSD symptoms).    . ranolazine (RANEXA) 500 MG 12 hr tablet Take 1 tablet (500 mg total) by mouth 2 (two) times daily. 180 tablet 3  . tadalafil (CIALIS) 20 MG tablet Take 20 mg by mouth daily as needed for erectile dysfunction.    Marland Kitchen testosterone cypionate (DEPOTESTOSTERONE CYPIONATE) 200 MG/ML injection Inject 200 mg into the muscle once a week.    . traZODone (DESYREL) 150 MG tablet Take 150 mg by mouth at bedtime.      No current facility-administered medications for this encounter.     BP 140/79   Pulse 65   Wt 216 lb (98 kg)   SpO2 97%   BMI 31.90 kg/m  General: NAD Neck: No JVD, no thyromegaly or thyroid nodule.  Lungs: Clear to auscultation bilaterally with normal respiratory effort. CV: Nondisplaced PMI.  Heart regular S1/S2, no S3/S4, 1/6 SEM RUSB.  1+ left ankle edema.  No carotid bruit.  Normal pedal pulses.  Abdomen: Soft, nontender, no hepatosplenomegaly, no distention.  Skin: Intact without lesions or rashes.  Neurologic: Alert and oriented x 3.  Psych: Normal affect. Extremities: No clubbing or cyanosis.  HEENT: Normal.   Assessment/Plan: 1. S/p mitral valve repair:  Echo in  8/18 showed stable mitral valve repair with no MR, mean gradient 6 mmHg.   - With prosthetic material, should use antibiotic prophylaxis with dental work.  2. CAD:  He has severe stenosis in a distal D1 that is not amenable to intervention.  This has been stable on last couple of caths.  No further chest pain.  - No ASA with stable coronary disease and apixaban use.  - He is on ranolazine for angina.  - He cannot tolerate statins with myalgias, working on getting Praluent through the [place].  3. Hyperlipidemia: Myalgias with Crestor, pravastatin, and atorvastatin.  We are now trying to arrange for him to get Praluent through the [place].  4. Chronic diastolic CHF: Volume status looks ok today.  LVH and diastolic dysfunction on last echo.  - Lasix now at 40 mg daily. BMET today.  5. OSA: Continue CPAP.  6. Atrial fibrillation: Paroxysmal, in NSR after DCCV in 1/19.  If recurs, may need amiodarone or sotalol.  - Continue apixaban 5 mg bid.  - Ranolazine will likely decrease risk of recurrence.  7. CKD stage 3: Recent AKI, uncertain etiology.  Off all NSAIDs.  BMET today.   Followup in 3 months.   [name] 03/10/2018

## 2018-03-10 NOTE — Patient Instructions (Signed)
Labs today  We will contact you in 3 months to schedule your next appointment.  

## 2018-03-10 NOTE — Progress Notes (Signed)
  Assessment:  Primary concerns today: kidney disease.    Pt states he has the lap band that does have fluid in it. Pt states he feels bloated with no weight on his arms and legs. Pt states he does not want his lap band removed. Pt states he was taking metformin but has sopped since discovering CK stage 3. Pt states he wakes up about 6am. Pt states he sleeps about 4-6 hours a night. Pt states he has felt better since taking off the cholesterol medicine. 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 Pt states he does not like salt. Pt states he does have 1.5 gallons of water a day. Pt states he will not chicken becasue he ate so much as a kid. Pt states if he eats too fast or takes too big of Bite it will feel like it gets stuck. Pt states he eats out every day.   MEDICATIONS: See List   DIETARY INTAKE:  Usual eating pattern includes 3 meals and 2 snacks per day.  Everyday foods include none stated.  Avoided foods include none stated.    24-hr recall: 2 snacks before dinner and 1 snack after dinner B ( 9:30-10AM): biscuit with contents ripped out with steak or chicken or scrambled eggs and grits or grits with mayo and mustard Snk ( AM):  Crackers and peanut butter  L ( PM): hamburger steak and grilled onions Snk ( PM): fruit and cheesits and popcorn  D ( PM): grilled fish or beef or pork and hushpuppies Snk ( PM): popcorn or peanubutter with nilla wafer Beverages: diet crangrape juice, crystal light lemonade, clear soda, 1-1.5 gallons of water  Usual physical activity: ADL's  Estimated energy needs: 1800 calories 200 g carbohydrates 135 g protein 50 g fat  Progress Towards Goal(s):  In progress.    Intervention:  Nutrition counseling. Dietitian educated the pt on a kidney friendly/low sodium diet. Goals: -Only eat out 1 time a week and read the nutrition facts of the meal you want prior to going to the restaurant  -Aim for 2000 mg or less of sodium: 400 or  less per meal -Follow the kidney nutrition pamphlet for daily guidance   Teaching Method Utilized:  Visual Auditory Hands on  Handouts given during visit include:  Kidney meals pamphlet   Barriers to learning/adherence to lifestyle change: denial of kidney disease   Demonstrated degree of understanding via:  Teach Back   Monitoring/Evaluation:  Dietary intake, exercise, and body weight prn.

## 2018-03-10 NOTE — Patient Instructions (Addendum)
-  Aim for 80 ounces of fluid and more if sweating  -Try Zevia  -Try Googling [facility] recipes

## 2018-03-11 DIAGNOSIS — R0781 Pleurodynia: Secondary | ICD-10-CM | POA: Diagnosis not present

## 2018-03-15 ENCOUNTER — Telehealth: Payer: Self-pay | Admitting: Internal Medicine

## 2018-03-15 DIAGNOSIS — I2510 Atherosclerotic heart disease of native coronary artery without angina pectoris: Secondary | ICD-10-CM | POA: Diagnosis not present

## 2018-03-15 DIAGNOSIS — I129 Hypertensive chronic kidney disease with stage 1 through stage 4 chronic kidney disease, or unspecified chronic kidney disease: Secondary | ICD-10-CM | POA: Diagnosis not present

## 2018-03-15 DIAGNOSIS — I4891 Unspecified atrial fibrillation: Secondary | ICD-10-CM | POA: Diagnosis not present

## 2018-03-15 DIAGNOSIS — I509 Heart failure, unspecified: Secondary | ICD-10-CM | POA: Diagnosis not present

## 2018-03-15 DIAGNOSIS — E785 Hyperlipidemia, unspecified: Secondary | ICD-10-CM | POA: Diagnosis not present

## 2018-03-15 DIAGNOSIS — N2581 Secondary hyperparathyroidism of renal origin: Secondary | ICD-10-CM | POA: Diagnosis not present

## 2018-03-15 DIAGNOSIS — N184 Chronic kidney disease, stage 4 (severe): Secondary | ICD-10-CM | POA: Diagnosis not present

## 2018-03-15 DIAGNOSIS — E1122 Type 2 diabetes mellitus with diabetic chronic kidney disease: Secondary | ICD-10-CM | POA: Diagnosis not present

## 2018-03-15 DIAGNOSIS — N179 Acute kidney failure, unspecified: Secondary | ICD-10-CM | POA: Diagnosis not present

## 2018-03-15 DIAGNOSIS — Z9889 Other specified postprocedural states: Secondary | ICD-10-CM | POA: Diagnosis not present

## 2018-03-15 DIAGNOSIS — E669 Obesity, unspecified: Secondary | ICD-10-CM | POA: Diagnosis not present

## 2018-03-15 DIAGNOSIS — D631 Anemia in chronic kidney disease: Secondary | ICD-10-CM | POA: Diagnosis not present

## 2018-03-15 NOTE — Telephone Encounter (Signed)
[facility] - [facility]ral[facility] because patient was not taking pravastatin + Zetia and has a CrCl < 30. Advised them that pt has already taken pravastatin and experienced myalgias, and that Zetia monotherapy would not bring LDL to goal. Also advised them that there is no contraindication or renal dose adjustment for Praluent as monoclonal antibodies are not cleared renally. Representative advised he would send request back to clinical pharmacy team at the [facility] for review.

## 2018-03-16 NOTE — Telephone Encounter (Signed)
Called patient this morning; he has been scheduled to re-establish with [facility] on Friday 03/18/18 at 11:30am.

## 2018-03-17 ENCOUNTER — Telehealth (HOSPITAL_COMMUNITY): Payer: Self-pay | Admitting: *Deleted

## 2018-03-17 NOTE — Telephone Encounter (Signed)
Result Notes for Basic metabolic panel   Notes recorded by Darron Doom, RN on 03/17/2018 at 3:59 PM EDT Called and left detailed message to follow up with renal. ------  Notes recorded by Darron Doom, RN on 03/14/2018 at 9:11 AM EDT Called and left message. ------  Notes recorded by [name], MD on 03/13/2018 at 11:46 PM EDT Creatinine remains high without explanation. Make sure he has followup with renal. ------  Notes recorded by Scarlette Calico, RN on 03/11/2018 at 3:32 PM EDT Labs reviewed by RN, will forward to MD for review

## 2018-03-18 ENCOUNTER — Ambulatory Visit (INDEPENDENT_AMBULATORY_CARE_PROVIDER_SITE_OTHER): Payer: Medicare Other | Admitting: Internal Medicine

## 2018-03-18 ENCOUNTER — Encounter: Payer: Self-pay | Admitting: Internal Medicine

## 2018-03-18 DIAGNOSIS — G4733 Obstructive sleep apnea (adult) (pediatric): Secondary | ICD-10-CM | POA: Diagnosis not present

## 2018-03-18 DIAGNOSIS — I2510 Atherosclerotic heart disease of native coronary artery without angina pectoris: Secondary | ICD-10-CM | POA: Diagnosis not present

## 2018-03-18 DIAGNOSIS — I208 Other forms of angina pectoris: Secondary | ICD-10-CM | POA: Diagnosis not present

## 2018-03-18 NOTE — Progress Notes (Signed)
03/18/2018-72 year old male [place] never smoker for sleep evaluation.  NPSG 02/08/00- AHI 116/ hr Medical problem list includes allergic rhinitis/conjunctivitis, CAD/ MI/CABG/dCHF, Carotid stenosis/ TIA, Mitral Valve Replacement, DM 2, HBP, GERD, -----Has used CPAP 13 for the last 30 years, needs a new machine , [facility], would like to come here now for treatment, he likes his CPAP and uses everyday Has appointment with Dr. Ron Parker to get oral appliance to use with travel. Epworth score 21 His last sleep study was about 3 years ago.  He thinks his machine is at least 5 and probably more like 20 or 73 years old.  Says he could not sleep without CPAP now. No ENT surgery.  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  He is not aware of lung disease and says his cardiac status is currently stable but closely monitored.   Prior to Admission medications   Medication Sig Start Date End Date Taking? Authorizing Provider  Alirocumab (PRALUENT) 150 MG/ML SOPN Inject 150 mg into the skin every 14 (fourteen) days. 01/27/18  Yes [name], MD  amoxicillin (AMOXIL) 500 MG tablet Take 4 tablets (2 g) by mouth 30-60 minutes prior to dental appointment Patient taking differently: Take 2,000 mg by mouth See admin instructions. 2,000 mg 30-60 minutes prior to dental or medical procedures 05/20/16  Yes [name], MD  apixaban (ELIQUIS) 5 MG TABS tablet Take 1 tablet (5 mg total) by mouth 2 (two) times daily. 09/09/17  Yes [name], MD  Artificial Saliva (MOUTH KOTE MT) Use as directed 4 sprays in the mouth or throat every 3 (three) hours as needed (for dry mouth).   Yes [provider]  buPROPion (WELLBUTRIN) 75 MG tablet Take 75 mg by mouth daily.   Yes [provider]  carvedilol (COREG) 6.25 MG tablet Take 1 tablet (6.25 mg total) by mouth 2 (two) times daily with a meal. 10/19/17  Yes [name], MD  cetirizine (ZYRTEC) 10 MG tablet Take 10 mg by mouth daily as needed  (for allergies.).    Yes [provider]  Cholecalciferol (VITAMIN D) 2000 units CAPS Take 2,000 Units by mouth daily.   Yes [provider]  diphenhydrAMINE (BENADRYL) 25 MG tablet Take 25 mg by mouth at bedtime as needed (for sleep.).   Yes [provider]  fluticasone (FLONASE) 50 MCG/ACT nasal spray Place 1 spray into both nostrils daily as needed for allergies or rhinitis.   Yes [provider]  HYDROcodone-acetaminophen (NORCO) 10-325 MG tablet Take 1 tablet by mouth every 4 (four) hours as needed for moderate pain. 04/13/16  Yes [name], MD  Hypromellose (ARTIFICIAL TEARS OP) Apply 1 drop to eye 2 (two) times daily as needed (dry eyes).   Yes [provider]  ibuprofen (ADVIL,MOTRIN) 200 MG tablet Take 400-800 mg by mouth every 8 (eight) hours as needed (for knee/back pain or arthritis pain.).   Yes [provider]  nitroGLYCERIN (NITROSTAT) 0.4 MG SL tablet DISSOLVE ONE TABLET UNDER THE TONGUE EVERY 5 MINUTES AS NEEDED FOR CHEST PAIN.  DO NOT EXCEED A TOTAL OF 3 DOSES IN 15 MINUTES 04/13/17  Yes [name], MD  PARoxetine (PAXIL) 40 MG tablet Take 20 mg by mouth daily as needed (PTSD symptoms).   Yes [provider]  ranolazine (RANEXA) 500 MG 12 hr tablet Take 1 tablet (500 mg total) by mouth 2 (two) times daily. 06/24/17  Yes [name], MD  tadalafil (CIALIS) 20  MG tablet Take 20 mg by mouth daily as needed for erectile dysfunction.   Yes [provider]  testosterone cypionate (DEPOTESTOSTERONE CYPIONATE) 200 MG/ML injection Inject 200 mg into the muscle once a week.   Yes [provider]  traZODone (DESYREL) 150 MG tablet Take 150 mg by mouth at bedtime.    Yes [provider]   Past Medical History:  Diagnosis Date  . Allergic rhinitis   . Anxiety   . Cancer ([place])    hx of skin cancers   . Chronic kidney disease   . Coronary artery disease    a. s/p MI & CABG; b. s/p PCI  Diag;  c. Cath 12/2011: LAD diff dzs/small, Diag 75% isr, 90 dist to stent (small), LCX & RCA occluded, VG->OM 40, VG->PDA patent - med rx.  . Depression   . Diabetes mellitus    ORAL MEDS  . Diastolic CHF, chronic (HCC)    a. EF 55-60% by echo 2007  . Dysrhythmia    "abnormal beat"  . GERD (gastroesophageal reflux disease)    "not anymore" " I had a lap band"  . History of diverticulitis of colon    5 YRS AGO  . History of pneumonia    2017  . History of transient ischemic attack (TIA)    CAROTID DOPPLERS NOV 2011  0-39& BIL. STENOSIS  . Hyperlipidemia   . Hypertension   . Mitral regurgitation   . Myocardial infarction ([place])   . Neuromuscular disorder ([place])    left arm numbness   . 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 PTSD (post-traumatic stress disorder)    from [place]  . 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 Shortness of breath dyspnea    with exertion  . Sleep apnea    cpap- see ov note in [facility] 05/12/11 for settings    Past Surgical History:  Procedure Laterality Date  . BLEPHAROPLASTY  1985   BILATERAL  . CARDIAC CATHETERIZATION  2001, 2004, 2009, 2011  . CARDIAC CATHETERIZATION N/A 01/23/2016   Procedure: Right/Left Heart Cath and Coronary Angiography;  Surgeon: [name], MD;  Location: [place] CV LAB;  Service: Cardiovascular;  Laterality: N/A;  . CARDIOVERSION N/A 09/07/2017   Procedure: CARDIOVERSION;  Surgeon: [name], MD;  Location: [facility] ENDOSCOPY;  Service: Cardiovascular;  Laterality: N/A;  . CATARACT EXTRACTION W/ INTRAOCULAR LENS IMPLANT Bilateral   . CHONDROPLASTY  07/22/2011   Procedure: CHONDROPLASTY;  Surgeon: Dione Plover Aluisio;  Location: [place];  Service: Orthopedics;;  .  CIRCUMCISION  35 YRS  AGO  . COLONOSCOPY    . CORONARY ANGIOPLASTY  1996-- POST CABG   W/ STENT, last cath 01/07/2012   . CORONARY ARTERY BYPASS GRAFT  1995   X3 VESSEL  . CORONARY STENT PLACEMENT    . KNEE ARTHROSCOPY  07/22/2011   Procedure: ARTHROSCOPY KNEE;  Surgeon: [name];  Location: [place];  Service: Orthopedics;  Laterality: Right;  WITH DEBRIDEMENT   . KNEE ARTHROSCOPY W/ MENISCECTOMY  X2 IN 2002-- RIGHT KNEE  . LAPAROSCOPIC GASTRIC BANDING  01-15-09  . LEFT HEART CATH AND CORONARY ANGIOGRAPHY N/A 04/29/2017   Procedure: LEFT HEART CATH AND CORONARY ANGIOGRAPHY;  Surgeon: [name], MD;  Location: [place] CV LAB;  Service: Cardiovascular;  Laterality: N/A;  . LEFT HEART CATHETERIZATION WITH CORONARY ANGIOGRAM N/A 09/11/2013   Procedure: LEFT HEART CATHETERIZATION WITH CORONARY ANGIOGRAM;  Surgeon: [name], MD;  Location: [facility] CATH LAB;  Service: Cardiovascular;  Laterality: N/A;  . MITRAL VALVE REPAIR Right 03/25/2016   Procedure: MINIMALLY INVASIVE REOPERATION FOR MITRAL VALVE REPAIR  (MVR) with size 26 Sorin Memo 3D;  Surgeon: [name], MD;  Location: [place];  Service: Open Heart Surgery;  Laterality: Right;  . RIGHT KNEE ED COMPARTMENT REPLACEMENT  2004  . SYNOVECTOMY  07/22/2011   Procedure: SYNOVECTOMY;  Surgeon: [name];  Location: [place];  Service: Orthopedics;;  . TEE WITHOUT CARDIOVERSION N/A 01/23/2016   Procedure: TRANSESOPHAGEAL ECHOCARDIOGRAM (TEE);  Surgeon: [name], MD;  Location: [place];  Service: Cardiovascular;  Laterality: N/A;  . TEE WITHOUT CARDIOVERSION N/A 03/25/2016   Procedure: TRANSESOPHAGEAL ECHOCARDIOGRAM (TEE);  Surgeon: [name], MD;  Location: [place];  Service: Open Heart Surgery;  Laterality: N/A;  . UMBILICAL HERNIA REPAIR  01-15-09   W/ GASTRIC BANDING PROCEDURE   Breast Cancer-relatedfamily history is not on file. Social History   Socioeconomic History   . Marital status: Married    Spouse name: Not on file  . Number of children: Not on file  . Years of education: Not on file  . Highest education level: Not on file  Occupational History  . Not on file  Social Needs  . Financial resource strain: Not on file  . Food insecurity:    Worry: Not on file    Inability: Not on file  . Transportation needs:    Medical: Not on file    Non-medical: Not on file  Tobacco Use  . Smoking status: Never Smoker  . Smokeless tobacco: Never Used  Substance and Sexual Activity  . Alcohol use: Yes    Comment: OCCASIONAL  . Drug use: No  . Sexual activity: Not on file  Lifestyle  . Physical activity:    Days per week: Not on file    Minutes per session: Not on file  . Stress: Not on file  Relationships  . Social connections:    Talks on phone: Not on file    Gets together: Not on file    Attends religious service: Not on file    Active member of club or organization: Not on file    Attends meetings of clubs or organizations: Not on file    Relationship status: Not on file  . Intimate partner violence:    Fear of current or ex partner: Not on file    Emotionally abused: Not on file    Physically abused: Not on file    Forced sexual activity: Not on file  Other Topics Concern  . Not on file  Social History Narrative  . Not on file  .yr OBJ- Physical Exam General- Alert, Oriented, Affect-appropriate, Distress- none acute, not obese Skin- rash-none, lesions- none, excoriation- none Lymphadenopathy- none Head- atraumatic            Eyes- Gross vision intact, PERRLA, conjunctivae and secretions clear            Ears- Hearing, canals-normal            Nose- Clear, +Septal dev, mucus, polyps, erosion, perforation             Throat- Mallampati IV , mucosa clear , drainage- none, tonsils- atrophic Neck-  flexible , trachea midline, no stridor , thyroid nl, carotid no bruit Chest - symmetrical excursion , unlabored           Heart/CV- RRR ,  no murmur , no gallop  , no rub, nl s1 s2                           - JVD- none , edema- none, stasis changes- none, varices- none           Lung- clear to P&A, wheeze- none, cough- none , dullness-none, rub- none           Chest wall-  Abd-  Br/ Gen/ Rectal- Not done, not indicated Extrem- cyanosis- none, clubbing, none, atrophy- none, strength- nl Neuro- grossly intact to observation

## 2018-03-18 NOTE — Patient Instructions (Signed)
Order- please schedule unattended home sleep test   Dx OSA  Please call me about 2 weeks after your sleep study, for results and recommendations. If appropriate, we should be able to get you a new CPAP machine and new DME company here to work with you.  We will send records to Dr Ron Parker so he can help you with your oral appliance

## 2018-03-21 NOTE — Assessment & Plan Note (Addendum)
He has been benefiting from CPAP with improved sleep and has had excellent compliance and control, reporting he is unable to sleep without CPAP now.  We will update documentation and replace his old machine anticipating change from fixed pressure 13 to AutoPap. He wants to continue using an oral appliance for travel and is going to Dr. Ron Parker. Plan-schedule sleep study and replace machine. Oral appliance for travel

## 2018-03-21 NOTE — Assessment & Plan Note (Signed)
This his heart disease is his primary concern and feels he is stable now, actively managed by cardiology.  He denies chest pain or palpitations.

## 2018-03-22 DIAGNOSIS — M79672 Pain in left foot: Secondary | ICD-10-CM | POA: Diagnosis not present

## 2018-03-22 DIAGNOSIS — M79671 Pain in right foot: Secondary | ICD-10-CM | POA: Insufficient documentation

## 2018-04-18 DIAGNOSIS — N184 Chronic kidney disease, stage 4 (severe): Secondary | ICD-10-CM | POA: Diagnosis not present

## 2018-05-10 DIAGNOSIS — B9689 Other specified bacterial agents as the cause of diseases classified elsewhere: Secondary | ICD-10-CM | POA: Diagnosis not present

## 2018-05-10 DIAGNOSIS — J019 Acute sinusitis, unspecified: Secondary | ICD-10-CM | POA: Diagnosis not present

## 2018-05-12 ENCOUNTER — Other Ambulatory Visit (HOSPITAL_COMMUNITY): Payer: Self-pay | Admitting: Cardiology

## 2018-05-12 DIAGNOSIS — E785 Hyperlipidemia, unspecified: Secondary | ICD-10-CM

## 2018-05-12 DIAGNOSIS — R079 Chest pain, unspecified: Secondary | ICD-10-CM

## 2018-05-12 DIAGNOSIS — I2510 Atherosclerotic heart disease of native coronary artery without angina pectoris: Secondary | ICD-10-CM

## 2018-05-12 DIAGNOSIS — I208 Other forms of angina pectoris: Secondary | ICD-10-CM

## 2018-05-16 DIAGNOSIS — L603 Nail dystrophy: Secondary | ICD-10-CM | POA: Diagnosis not present

## 2018-05-25 DIAGNOSIS — L603 Nail dystrophy: Secondary | ICD-10-CM | POA: Diagnosis not present

## 2018-05-27 DIAGNOSIS — R05 Cough: Secondary | ICD-10-CM | POA: Diagnosis not present

## 2018-05-27 DIAGNOSIS — R0989 Other specified symptoms and signs involving the circulatory and respiratory systems: Secondary | ICD-10-CM | POA: Diagnosis not present

## 2018-06-08 DIAGNOSIS — D485 Neoplasm of uncertain behavior of skin: Secondary | ICD-10-CM | POA: Diagnosis not present

## 2018-06-08 DIAGNOSIS — L739 Follicular disorder, unspecified: Secondary | ICD-10-CM | POA: Diagnosis not present

## 2018-06-08 DIAGNOSIS — Z85828 Personal history of other malignant neoplasm of skin: Secondary | ICD-10-CM | POA: Diagnosis not present

## 2018-06-08 DIAGNOSIS — L570 Actinic keratosis: Secondary | ICD-10-CM | POA: Diagnosis not present

## 2018-06-08 DIAGNOSIS — L720 Epidermal cyst: Secondary | ICD-10-CM | POA: Diagnosis not present

## 2018-06-14 DIAGNOSIS — N184 Chronic kidney disease, stage 4 (severe): Secondary | ICD-10-CM | POA: Diagnosis not present

## 2018-06-14 DIAGNOSIS — E1122 Type 2 diabetes mellitus with diabetic chronic kidney disease: Secondary | ICD-10-CM | POA: Diagnosis not present

## 2018-06-14 DIAGNOSIS — I2510 Atherosclerotic heart disease of native coronary artery without angina pectoris: Secondary | ICD-10-CM | POA: Diagnosis not present

## 2018-06-14 DIAGNOSIS — I4891 Unspecified atrial fibrillation: Secondary | ICD-10-CM | POA: Diagnosis not present

## 2018-06-14 DIAGNOSIS — N189 Chronic kidney disease, unspecified: Secondary | ICD-10-CM | POA: Diagnosis not present

## 2018-06-14 DIAGNOSIS — N2581 Secondary hyperparathyroidism of renal origin: Secondary | ICD-10-CM | POA: Diagnosis not present

## 2018-06-14 DIAGNOSIS — Z9889 Other specified postprocedural states: Secondary | ICD-10-CM | POA: Diagnosis not present

## 2018-06-14 DIAGNOSIS — J329 Chronic sinusitis, unspecified: Secondary | ICD-10-CM | POA: Diagnosis not present

## 2018-06-14 DIAGNOSIS — E785 Hyperlipidemia, unspecified: Secondary | ICD-10-CM | POA: Diagnosis not present

## 2018-06-14 DIAGNOSIS — I509 Heart failure, unspecified: Secondary | ICD-10-CM | POA: Diagnosis not present

## 2018-06-14 DIAGNOSIS — D631 Anemia in chronic kidney disease: Secondary | ICD-10-CM | POA: Diagnosis not present

## 2018-06-14 DIAGNOSIS — N390 Urinary tract infection, site not specified: Secondary | ICD-10-CM | POA: Diagnosis not present

## 2018-06-14 DIAGNOSIS — N179 Acute kidney failure, unspecified: Secondary | ICD-10-CM | POA: Diagnosis not present

## 2018-06-14 DIAGNOSIS — I129 Hypertensive chronic kidney disease with stage 1 through stage 4 chronic kidney disease, or unspecified chronic kidney disease: Secondary | ICD-10-CM | POA: Diagnosis not present

## 2018-06-17 ENCOUNTER — Telehealth (HOSPITAL_COMMUNITY): Payer: Self-pay

## 2018-06-17 ENCOUNTER — Ambulatory Visit (HOSPITAL_COMMUNITY)
Admission: RE | Admit: 2018-06-17 | Discharge: 2018-06-17 | Disposition: A | Discharge status: Home / Self Care | Payer: Medicare Other | Source: Ambulatory Visit | Attending: Cardiology | Admitting: Cardiology

## 2018-06-17 VITALS — BP 136/84 | HR 63 | Wt 217.6 lb

## 2018-06-17 DIAGNOSIS — Z818 Family history of other mental and behavioral disorders: Secondary | ICD-10-CM | POA: Insufficient documentation

## 2018-06-17 DIAGNOSIS — Z951 Presence of aortocoronary bypass graft: Secondary | ICD-10-CM | POA: Diagnosis not present

## 2018-06-17 DIAGNOSIS — I5022 Chronic systolic (congestive) heart failure: Secondary | ICD-10-CM | POA: Diagnosis not present

## 2018-06-17 DIAGNOSIS — N183 Chronic kidney disease, stage 3 (moderate): Secondary | ICD-10-CM | POA: Diagnosis not present

## 2018-06-17 DIAGNOSIS — E785 Hyperlipidemia, unspecified: Secondary | ICD-10-CM

## 2018-06-17 DIAGNOSIS — I340 Nonrheumatic mitral (valve) insufficiency: Secondary | ICD-10-CM | POA: Insufficient documentation

## 2018-06-17 DIAGNOSIS — E1122 Type 2 diabetes mellitus with diabetic chronic kidney disease: Secondary | ICD-10-CM | POA: Diagnosis not present

## 2018-06-17 DIAGNOSIS — Z6831 Body mass index (BMI) 31.0-31.9, adult: Secondary | ICD-10-CM | POA: Insufficient documentation

## 2018-06-17 DIAGNOSIS — I4892 Unspecified atrial flutter: Secondary | ICD-10-CM | POA: Diagnosis not present

## 2018-06-17 DIAGNOSIS — E669 Obesity, unspecified: Secondary | ICD-10-CM | POA: Diagnosis not present

## 2018-06-17 DIAGNOSIS — Z9884 Bariatric surgery status: Secondary | ICD-10-CM | POA: Diagnosis not present

## 2018-06-17 DIAGNOSIS — Z8673 Personal history of transient ischemic attack (TIA), and cerebral infarction without residual deficits: Secondary | ICD-10-CM | POA: Insufficient documentation

## 2018-06-17 DIAGNOSIS — I130 Hypertensive heart and chronic kidney disease with heart failure and stage 1 through stage 4 chronic kidney disease, or unspecified chronic kidney disease: Secondary | ICD-10-CM | POA: Insufficient documentation

## 2018-06-17 DIAGNOSIS — I2510 Atherosclerotic heart disease of native coronary artery without angina pectoris: Secondary | ICD-10-CM | POA: Insufficient documentation

## 2018-06-17 DIAGNOSIS — I25810 Atherosclerosis of coronary artery bypass graft(s) without angina pectoris: Secondary | ICD-10-CM

## 2018-06-17 DIAGNOSIS — Z8249 Family history of ischemic heart disease and other diseases of the circulatory system: Secondary | ICD-10-CM | POA: Insufficient documentation

## 2018-06-17 DIAGNOSIS — R079 Chest pain, unspecified: Secondary | ICD-10-CM

## 2018-06-17 DIAGNOSIS — G4733 Obstructive sleep apnea (adult) (pediatric): Secondary | ICD-10-CM | POA: Insufficient documentation

## 2018-06-17 DIAGNOSIS — Z823 Family history of stroke: Secondary | ICD-10-CM | POA: Insufficient documentation

## 2018-06-17 DIAGNOSIS — I5032 Chronic diastolic (congestive) heart failure: Secondary | ICD-10-CM | POA: Diagnosis not present

## 2018-06-17 DIAGNOSIS — Z9889 Other specified postprocedural states: Secondary | ICD-10-CM

## 2018-06-17 DIAGNOSIS — Z7901 Long term (current) use of anticoagulants: Secondary | ICD-10-CM | POA: Insufficient documentation

## 2018-06-17 DIAGNOSIS — Z833 Family history of diabetes mellitus: Secondary | ICD-10-CM | POA: Diagnosis not present

## 2018-06-17 DIAGNOSIS — Z79899 Other long term (current) drug therapy: Secondary | ICD-10-CM | POA: Diagnosis not present

## 2018-06-17 DIAGNOSIS — I480 Paroxysmal atrial fibrillation: Secondary | ICD-10-CM | POA: Diagnosis not present

## 2018-06-17 LAB — CBC
HEMATOCRIT: 41.7 % (ref 39.0–52.0)
Hemoglobin: 13.4 g/dL (ref 13.0–17.0)
MCH: 29.5 pg (ref 26.0–34.0)
MCHC: 32.1 g/dL (ref 30.0–36.0)
MCV: 91.9 fL (ref 80.0–100.0)
NRBC: 0 % (ref 0.0–0.2)
Platelets: 221 10*3/uL (ref 150–400)
RBC: 4.54 MIL/uL (ref 4.22–5.81)
RDW: 14.5 % (ref 11.5–15.5)
WBC: 6.4 10*3/uL (ref 4.0–10.5)

## 2018-06-17 LAB — BASIC METABOLIC PANEL
ANION GAP: 8 (ref 5–15)
BUN: 32 mg/dL — ABNORMAL HIGH (ref 8–23)
CHLORIDE: 100 mmol/L (ref 98–111)
CO2: 29 mmol/L (ref 22–32)
Calcium: 9.3 mg/dL (ref 8.9–10.3)
Creatinine, Ser: 4.15 mg/dL — ABNORMAL HIGH (ref 0.61–1.24)
GFR calc non Af Amer: 13 mL/min — ABNORMAL LOW (ref >60)
GFR, EST AFRICAN AMERICAN: 15 mL/min — AB (ref >60)
GLUCOSE: 156 mg/dL — AB (ref 70–99)
Potassium: 4.1 mmol/L (ref 3.5–5.1)
Sodium: 137 mmol/L (ref 135–145)

## 2018-06-17 LAB — LIPID PANEL
Cholesterol: 245 mg/dL — ABNORMAL HIGH (ref 0–200)
HDL: 35 mg/dL — AB (ref >40)
LDL CALC: 177 mg/dL — AB (ref 0–99)
TRIGLYCERIDES: 166 mg/dL — AB (ref <150)
Total CHOL/HDL Ratio: 7 RATIO
VLDL: 33 mg/dL (ref 0–40)

## 2018-06-17 LAB — TROPONIN I: Troponin I: <0.03 ng/mL (ref <0.03)

## 2018-06-17 MED ORDER — ISOSORBIDE MONONITRATE ER 30 MG PO TB24: 30.0000 mg | ORAL_TABLET | Freq: Every day | ORAL | 3 refills | Status: DC

## 2018-06-17 NOTE — Telephone Encounter (Signed)
Pt called with results Creatinine is higher, decrease Lasix back to 60 mg daily (has been taking 80 mg daily) . Troponin negative.  LDL is very high.  Give wife a copy to take to the [place] so he can get Praluent/Repatha.

## 2018-06-17 NOTE — Patient Instructions (Signed)
Start Imdur 30mg  (1 tab) daily. Prescription was sent to your pharmacy  Labs were drawn today, office will contact you if results are abnormal  Your physician ordered you for Lexiscan (stress test) and an Echo.   Take the Zetia you have at home.   Your physician recommends that you schedule a follow-up appointment in: 10 days

## 2018-06-19 NOTE — Progress Notes (Signed)
Patient ID: [name], male   DOB: [birth year], 72 y.o.   MRN: [id number] PCP: Dr Harrington Challenger Cardiology: Dr. Aundra Dubin  72 y.o. with history of CAD s/p CABG and obesity s/p lap banding procedure presents for cardiology followup.  Given exertional dyspnea and chest tightness, he had LHC in 1/15.  This showed no change from prior.  Grafts were patent and there was severe stenosis in distal D1, not amenable to PCI.   In 2017, he developed increased exertional dyspnea.  Eventually had a TEE showing severe MR with prolapse of a portion of the anterior mitral valve leaflet.  Coronary angiography in 6/17 showed patent grafts and 90% stenosis of distal diagonal not amenable to intervention.  In 8/17, he had mitral valve repair.  Post-op diastolic CHF, Lasix started and increased.   He had a cath again in 9/18 showing stable coronary anatomy.    He developed atrial arrhythmias in 12/18.  Both fibrillation and flutter were seen.  Saw Dr. Rayann Heman, recommended DCCV with amiodarone or sotalol if fibrillation recurs.  He had DCCV in 1/19.   In 6/19, he was noted to have developed AKI. Creatinine went up to 3.46.  Renal US was done, showing normal kidneys. Cause of AKI was not clear.  We stopped his Lasix. He saw nephrology and eventually went back on Lasix, currently taking 60 mg daily.  He is not using any NSAIDs.    He stopped pravastatin due to myalgias. We tried to get him on a PCSK9-inhibitor through the [place] but apparently he has to take Zetia first.  He has received Zetia from the [place].   He was worked in today because of chest pain.  3 days ago, he had a 30 minute episode of substernal chest heaviness associated with nausea and shoulder pain.  Symptoms resolved after her took NTG x 3.  There was no trigger to the symptoms.  He did not go to the ER.  Since that time, he has had no further chest pain. No dyspnea walking on flat ground, gets short of breath chronically walking up stairs.  No lightheadedness or syncope.  Weight is stable.    Troponin level was sent off stat from clinic today and was normal.   ECG (personally reviewed): NSR, PVC, iRBBB  Labs (11/12): LDL 70 Labs (5/13): K 4.1, creatinine 1.1, BNP 48 Labs (12/13): K 3.4, creatinine 1.07, LDL 59, HDL 37 Labs (1/15): K 4.3, creatinine 1.2, HCT 40.3 Labs (3/15): LDL 38, HDL 81, BNP 55, K 4, creatinine 1.2 Labs (6/15): K 4.2, creatinine 1.4, LFTs normal, LDL 66, HDL 35 Labs (8/16): K 4, creatinine 1.6, BNP 64 Labs (12/16): LDL 61, HDL 37 Labs (4/17): K 4.2, creatinine 1.35, HCT 38.7, BNP 80 Labs (8/17): K 4.2, creatinine 1.4 => 1.66, BNP 159 Labs (12/17): K 3.6, creatinine 1.75 Labs (2/18): LDL 76, HDL 36 Labs (9/18): K 3.9, creatinine 1.76 Labs (1/19): K 4.9, creatinine 1.76, hgb 14 Labs (6/19): K 3.6, creatinine 3.46 Labs (7/19): K 4.1, creatinine 3.66, hgb 12.7  PMH: 1. OSA: on CPAP.  2. Diabetes mellitus 3. Allergic rhinitis 4. CAD: s/p CABG 1995.  LHC (10/11) with 90% distal D1 (patent proximal D1 stent), total occlusion CFX, total occlusion RCA, no significant stenoses in LAD, SVG-PDA patent, SVG-OM patent, EF 55%.  Medical management.  LHC (5/13) with patent grafts and 90% distal D1 stenosis (no significant change from 10/11).  LHC (1/15) with patent grafts, 75% ISR in D1 stent, 90% stenosis distal D1 (  small caliber). D1 not amenable to intervention.  - Coronary angiography (6/17): grafts patent, 90% stenosis distally in large diagonal not amenable to intervention.   - LHC (9/18): SVG-OM and SVG-PDA patent,  75% in-stent restenosis in proximal diagonal then 90% stenosis distally (small in caliber at that point), totally occluded LCx and RCA.  Left coronary system similar to prior caths, D1 may be source of angina.  5. Obesity: lap-banding in 5/11.  6. Chronic diastolic CHF: Echo (3/49) with EF 55-60%, moderate LVH, moderate diastolic dysfunction, s/p mitral valve repair with elevated mean gradient but normal pressure half-time.  -  Echo (8/18): EF 55-60%, s/p MV repair with mean gradient 6 mmHg, PASP 28 mmHg, mildly decreased RV systolic function.  7. TIA: Carotid dopplers in 11/11 with 0-39% bilateral stenosis.  Carotid dopplers 6/17 with 1-39% RICA stenosis.  8. OA: Knees, C-spine. TKR 9/13.  9. HTN 10. Hyperlipidemia: Myalgias with atorvastatin and Crestor.  11. Diverticulosis 12. MItral regurgitation: TEE (6/17) with EF 55-60%, severe eccentric MR, prolapse of a portion of the anterior leaflet, peak RV-RA gradient 45 mmHg.  - Mitral valve repair 8/17 13. CKD: Stage III.  14. Atrial fibrillation/atrial flutter: Both arrhythmias noted in 12/18.  DCCV 12/19.   15. 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  Renal US was normal.   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  Lives in [place].   Family History  Problem Relation Age of Onset  . Other Mother        joint problems  . Hypertension Father   . Heart disease Father   . CVA Father   . Depression Father   . Heart disease Sister        CABG  . Stroke Sister   . Hypertension Sister   . Congestive Heart Failure Sister   . Diabetes Sister   . Heart disease Brother   . Hypertension Brother   . Congestive Heart Failure Brother     ROS: All systems reviewed and negative except as per HPI.    Current Outpatient Medications  Medication Sig Dispense Refill  . Alirocumab (PRALUENT) 150 MG/ML SOPN Inject 150 mg into the skin every 14 (fourteen) days. 2 pen 11  . amoxicillin (AMOXIL) 500 MG tablet Take 4 tablets (2 g) by mouth 30-60 minutes prior to dental appointment (Patient taking differently: Take 2,000 mg by mouth See admin instructions. 2,000 mg 30-60 minutes prior to dental or medical procedures) 12 tablet 0  . apixaban (ELIQUIS) 5 MG TABS tablet Take 1 tablet (5 mg total) by mouth 2 (two) times daily. 180 tablet 3  . Artificial Saliva (MOUTH KOTE MT) Use as directed 4 sprays in the mouth or throat every 3 (three) hours as needed  (for dry mouth).    Marland Kitchen buPROPion (WELLBUTRIN) 75 MG tablet Take 75 mg by mouth daily.    . carvedilol (COREG) 6.25 MG tablet Take 1 tablet (6.25 mg total) by mouth 2 (two) times daily with a meal. 180 tablet 3  . cetirizine (ZYRTEC) 10 MG tablet Take 10 mg by mouth daily as needed (for allergies.).     Marland Kitchen Cholecalciferol (VITAMIN D) 2000 units CAPS Take 2,000 Units by mouth daily.    . diphenhydrAMINE (BENADRYL) 25 MG tablet Take 25 mg by mouth at bedtime as needed (for sleep.).    Marland Kitchen fluticasone (FLONASE) 50 MCG/ACT nasal spray Place 1 spray into both nostrils daily as needed for allergies or rhinitis.    Marland Kitchen  furosemide (LASIX) 20 MG tablet Take 60 mg by mouth daily.    Marland Kitchen HYDROcodone-acetaminophen (NORCO) 10-325 MG tablet Take 1 tablet by mouth every 4 (four) hours as needed for moderate pain. 20 tablet 0  . Hypromellose (ARTIFICIAL TEARS OP) Apply 1 drop to eye 2 (two) times daily as needed (dry eyes).    Marland Kitchen ibuprofen (ADVIL,MOTRIN) 200 MG tablet Take 400-800 mg by mouth every 8 (eight) hours as needed (for knee/back pain or arthritis pain.).    Marland Kitchen nitroGLYCERIN (NITROSTAT) 0.4 MG SL tablet PLACE 1 TABLET UNDER THE TONGUE EVERY 5 MINUTES AS NEEDED FOR CHEST PAIN UP TO 3 DOSES, IF SYMPTOMS PERSIST CALL 911 25 tablet 3  . PARoxetine (PAXIL) 40 MG tablet Take 20 mg by mouth daily as needed (PTSD symptoms).    . ranolazine (RANEXA) 500 MG 12 hr tablet Take 1 tablet (500 mg total) by mouth 2 (two) times daily. 180 tablet 3  . tadalafil (CIALIS) 20 MG tablet Take 20 mg by mouth daily as needed for erectile dysfunction.    Marland Kitchen testosterone cypionate (DEPOTESTOSTERONE CYPIONATE) 200 MG/ML injection Inject 200 mg into the muscle once a week.    . traZODone (DESYREL) 150 MG tablet Take 150 mg by mouth at bedtime.     . isosorbide mononitrate (IMDUR) 30 MG 24 hr tablet Take 1 tablet (30 mg total) by mouth daily. 90 tablet 3   No current facility-administered medications for this encounter.     BP 136/84   Pulse  63   Wt 98.7 kg (217 lb 9.6 oz)   SpO2 97%   BMI 31.22 kg/m  General: NAD Neck: Thick, no JVD, no thyromegaly or thyroid nodule.  Lungs: Clear to auscultation bilaterally with normal respiratory effort. CV: Nondisplaced PMI.  Heart regular S1/S2, no S3/S4, no murmur.  No peripheral edema.  No carotid bruit.  Normal pedal pulses.  Abdomen: Soft, nontender, no hepatosplenomegaly, no distention.  Skin: Intact without lesions or rashes.  Neurologic: Alert and oriented x 3.  Psych: Normal affect. Extremities: No clubbing or cyanosis.  HEENT: Normal.   Assessment/Plan: 1. S/p mitral valve repair:  Echo in 8/18 showed stable mitral valve repair with no MR, mean gradient 6 mmHg.   - With prosthetic material, should use antibiotic prophylaxis with dental work.  2. CAD:  He has severe stenosis in a distal D1 that is not amenable to intervention.  This has been stable on last couple of caths.  He had a worrisome episode of prolonged chest pain about 3 days ago.  He has had no chest pain since then and was not having pain before.  - Would not do coronary angiography unless he presented acutely with a clear ACS given CKD stage 3-4.  Given symptoms 3 days ago, I am going to arrange for a Lexiscan Cardiolite to assess for significant ischemia (would expect small amount of ischemia in the D1 territory).  I will also arrange for echo.  - No ASA with apixaban use.  - He is on Coreg and ranolazine for angina. I will add Imdur 30 mg daily.  - He cannot tolerate statins with myalgias.  [facility] until after he has been on Zetia.  Will check lipids on Zetia today.  If LDL > 70, he will need PCSK9-inhibitor. If he cannot get this through the [place], I will try again through our lipid clinic.  3. Hyperlipidemia: Myalgias with Crestor, pravastatin, and atorvastatin.  See discussion  above regarding lipids on Zetia and trying again to get PCSK9 inhibitor.   4. Chronic diastolic CHF: Volume  status looks ok today.  LVH and diastolic dysfunction on last echo.  - Lasix now at 60 mg daily, adjusted by renal. BMET today.  5. OSA: Continue CPAP.  6. Atrial fibrillation: Paroxysmal, in NSR after DCCV in 1/19.  If recurs, may need amiodarone or sotalol.  - Continue apixaban 5 mg bid. CBC today.  - Ranolazine will likely decrease risk of recurrence.  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   Followup in 10 days.   [name] 06/19/2018

## 2018-06-20 ENCOUNTER — Encounter: Payer: Self-pay | Admitting: Internal Medicine

## 2018-06-20 ENCOUNTER — Ambulatory Visit (INDEPENDENT_AMBULATORY_CARE_PROVIDER_SITE_OTHER): Payer: Medicare Other | Admitting: Internal Medicine

## 2018-06-20 VITALS — BP 124/62 | HR 63 | Ht 70.0 in | Wt 219.6 lb

## 2018-06-20 DIAGNOSIS — I208 Other forms of angina pectoris: Secondary | ICD-10-CM

## 2018-06-20 DIAGNOSIS — I2510 Atherosclerotic heart disease of native coronary artery without angina pectoris: Secondary | ICD-10-CM

## 2018-06-20 DIAGNOSIS — G4733 Obstructive sleep apnea (adult) (pediatric): Secondary | ICD-10-CM

## 2018-06-20 NOTE — Assessment & Plan Note (Signed)
In order for him to work through a DME, which would be more convenient for him, he will need to update sleep study.  He will also go back through Dr. Ron Parker to get his oral appliance adjusted for comfort since he uses this for travel. Plan-schedule HST then anticipate we will be able to getting the DME company and replace his very old CPAP machine.  Meanwhile he will go back to Dr. Ron Parker for adjustment of his oral appliance.

## 2018-06-20 NOTE — Patient Instructions (Signed)
Order- please schedule unattended home sleep test  Dx OSA  Please call us for results about 2 weeks after this test gets done. If appropriate, we will be able to establish you with a DME company to replace your very old CPAP machine.  Suggest you call Dr Ron Parker to get the oral appliance adjusted for comfort.

## 2018-06-20 NOTE — Assessment & Plan Note (Signed)
Describes a recent episode of chest pain for which he chose not to go to the hospital.  He is pending cardiology follow-up with stress test.

## 2018-06-20 NOTE — Progress Notes (Signed)
HPI male veteran never smoker followed for OSA, implicated by allergic rhinitis/conjunctivitis, CAD/ MI/CABG/dCHF, Carotid stenosis/ TIA, Mitral Valve Replacement, DM 2, HBP, GERD, NPSG 02/08/00- AHI 116/ hr  --------------------------------------------------------------------------------- 03/18/2018-72 year old male Veteran never smoker for sleep evaluation.  NPSG 02/08/00- AHI 116/ hr Medical problem list includes allergic rhinitis/conjunctivitis, CAD/ MI/CABG/dCHF, Carotid stenosis/ TIA, Mitral Valve Replacement, DM 2, HBP, GERD, -----Has used CPAP 13 for the last 30 years, needs a new machine , [facility], would like to come here now for treatment, he likes his CPAP and uses everyday Has appointment with Dr. Ron [name] to get oral appliance to use with travel. Epworth score 21 His last sleep study was about 3 years ago.  He thinks his machine is at least 5 and probably more like 15 or 73 years old.  Says he could not sleep without CPAP now. No ENT surgery.  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  He is not aware of lung disease and says his cardiac status is currently stable but closely monitored.  06/20/2018-72 year old male veteran never smoker followed for OSA, implicated by allergic rhinitis/conjunctivitis, CAD/ MI/CABG/dCHF, Carotid stenosis/ TIA, Mitral Valve Replacement, DM 2, HBP, GERD, CPAP -----follow up for OSA.  Apparently order was not placed last visit to schedule HST.  We discussed this and we will reschedule.  He says it would be more convenient for him to work through his insurance to get a [place] and replace his old CPAP machine, rather than having to go to [place] through the [place].  He did see Dr. Ron [name] for an oral appliance to use when traveling.  It is uncomfortable and he is going back this week for adjustment. He had an episode of chest pain at home last week and chose not to go to the hospital.  He is pending a chemical stress test for cardiology follow-up  soon. Has already had flu shot.  ROS-see HPI   + = positive Constitutional:    weight loss, night sweats, fevers, chills, fatigue, lassitude. HEENT:    headaches, difficulty swallowing, tooth/dental problems, sore throat,       sneezing, itching, ear ache, nasal congestion, post nasal drip, snoring CV:    chest pain, orthopnea, PND, swelling in lower extremities, anasarca,                                  dizziness, palpitations Resp:   shortness of breath with exertion or at rest.                productive cough,   non-productive cough, coughing up of blood.              change in color of mucus.  wheezing.   Skin:    rash or lesions. GI:  No-   heartburn, indigestion, abdominal pain, nausea, vomiting, diarrhea,                 change in bowel habits, loss of appetite GU: dysuria, change in color of urine, no urgency or frequency.   flank pain. MS:   joint pain, stiffness, decreased range of motion, back pain. Neuro-     nothing unusual Psych:  change in mood or affect.  depression or anxiety.   memory loss.  OBJ- Physical Exam General- Alert, Oriented, Affect-appropriate, Distress- none acute, not obese Skin- rash-none, lesions- none, excoriation- none Lymphadenopathy- none Head- atraumatic  Eyes- Gross vision intact, PERRLA, conjunctivae and secretions clear            Ears- Hearing, canals-normal            Nose- Clear, +Septal dev, mucus, polyps, erosion, perforation             Throat- Mallampati IV , mucosa clear , drainage- none, tonsils- atrophic Neck- flexible , trachea midline, no stridor , thyroid nl, carotid no bruit Chest - symmetrical excursion , unlabored           Heart/CV- RRR , no murmur , no gallop  , no rub, nl s1 s2                           - JVD- none , edema- none, stasis changes- none, varices- none           Lung- clear to P&A, wheeze- none, cough- none , dullness-none, rub- none           Chest wall-  Abd-  Br/ Gen/ Rectal- Not done, not  indicated Extrem- cyanosis- none, clubbing, none, atrophy- none, strength- nl Neuro- grossly intact to observation

## 2018-06-21 ENCOUNTER — Telehealth: Payer: Self-pay | Admitting: *Deleted

## 2018-06-21 NOTE — Telephone Encounter (Signed)
Patient given detailed instructions per Myocardial Perfusion Study Information Sheet for the test on 06/24/18. Patient notified to arrive 15 minutes early and that it is imperative to arrive on time for appointment to keep from having the test rescheduled.  If you need to cancel or reschedule your appointment, please call the office within 24 hours of your appointment. . Patient verbalized understanding. Kirstie Peri

## 2018-06-22 DIAGNOSIS — N184 Chronic kidney disease, stage 4 (severe): Secondary | ICD-10-CM | POA: Diagnosis not present

## 2018-06-22 DIAGNOSIS — I129 Hypertensive chronic kidney disease with stage 1 through stage 4 chronic kidney disease, or unspecified chronic kidney disease: Secondary | ICD-10-CM | POA: Diagnosis not present

## 2018-06-22 DIAGNOSIS — N183 Chronic kidney disease, stage 3 (moderate): Secondary | ICD-10-CM | POA: Diagnosis not present

## 2018-06-22 DIAGNOSIS — R339 Retention of urine, unspecified: Secondary | ICD-10-CM | POA: Diagnosis not present

## 2018-06-24 ENCOUNTER — Telehealth (HOSPITAL_COMMUNITY): Payer: Self-pay

## 2018-06-24 ENCOUNTER — Telehealth: Payer: Self-pay | Admitting: Pharmacist

## 2018-06-24 ENCOUNTER — Other Ambulatory Visit: Payer: Self-pay

## 2018-06-24 ENCOUNTER — Ambulatory Visit (HOSPITAL_BASED_OUTPATIENT_CLINIC_OR_DEPARTMENT_OTHER): Payer: Medicare Other

## 2018-06-24 ENCOUNTER — Ambulatory Visit (HOSPITAL_COMMUNITY): Payer: Medicare Other | Attending: Cardiovascular Disease

## 2018-06-24 DIAGNOSIS — I5032 Chronic diastolic (congestive) heart failure: Secondary | ICD-10-CM

## 2018-06-24 DIAGNOSIS — R079 Chest pain, unspecified: Secondary | ICD-10-CM | POA: Diagnosis not present

## 2018-06-24 LAB — MYOCARDIAL PERFUSION IMAGING
CHL CUP NUCLEAR SDS: 2
CSEPPHR: 78 {beats}/min
LV dias vol: 104 mL (ref 62–150)
LV sys vol: 56 mL
Rest HR: 59 {beats}/min
SRS: 1
SSS: 3
TID: 0.92

## 2018-06-24 LAB — ECHOCARDIOGRAM COMPLETE
HEIGHTINCHES: 70 in
Weight: 3504 oz

## 2018-06-24 MED ORDER — REGADENOSON 0.4 MG/5ML IV SOLN
0.4000 mg | Freq: Once | INTRAVENOUS | Status: AC
  Administered 2018-06-24: 0.4 mg via INTRAVENOUS

## 2018-06-24 MED ORDER — TECHNETIUM TC 99M TETROFOSMIN IV KIT
32.6000 | PACK | Freq: Once | INTRAVENOUS | Status: AC | PRN
  Administered 2018-06-24: 32.6 via INTRAVENOUS
  Filled 2018-06-24: qty 33

## 2018-06-24 MED ORDER — TECHNETIUM TC 99M TETROFOSMIN IV KIT
10.3000 | PACK | Freq: Once | INTRAVENOUS | Status: AC | PRN
  Administered 2018-06-24: 10.3 via INTRAVENOUS
  Filled 2018-06-24: qty 11

## 2018-06-24 NOTE — Telephone Encounter (Signed)
Pt called with results EF 47% with old inferior infarct, no ischemia.  This is similar to the study from 2017. Would not proceed to cath with elevated  Creatinine.

## 2018-06-24 NOTE — Telephone Encounter (Signed)
EF remains in normal range, mildly increased gradient across mitral valve. Dilated IVC suggests fluid excess, pt stated that his kidney dr wanted to make changes to his medication and cut his lasix back and wanted to change his blood pressure meds pt has an appoint with Dr.McLean to talk about ECHO and medication changes

## 2018-06-24 NOTE — Telephone Encounter (Signed)
Pt presents to clinic today to pick up another prescription for Praluent since the [place] still won't fill his prescription. His LDL is 177 which is far above his goal < 70 due to his history of ASCVD.   Previous intolerances are listed below, he experienced myalgias with all: rosuvastatin 5mg  daily (2013 & 2017) atorvastatin 10mg  daily (Sep/2013-Dec/2013) pravastatin 40mg  daily (Oct/2017 - Feb/2019) pravastatin 80mg  daily (Feb/2019 - 12/2017)  Zetia will only lower his LDL 18% and will not bring his LDL anywhere near his goal. He requires Praluent to bring LDL to goal. Will print out this note again so that pt can bring to the [place] for Praluent therapy rationale. If they still do not want to cover Pralent, they should call [facility] [phone].   [facility] had [facility] because he was not taking pravastatin + Zetia and has a CrCl < 30. Advised them that pt had already taken pravastatin and experienced myalgias, and that Zetia monotherapy would not bring LDL to goal. Also advised them that there is no contraindication or renal dose adjustment for Praluent as monoclonal antibodies are not cleared renally. They were supposed to bring this request back to their clinical team for review, however it appears that they never did this.

## 2018-06-24 NOTE — Telephone Encounter (Signed)
Pt presents to clinic today to pick up another prescription for Praluent since the [place] still won't fill his prescription. His LDL is 177 which is far above his goal < 70 due to his history of ASCVD.   Previous intolerances are listed below, he experienced myalgias with all: rosuvastatin 5mg  daily (2013 & 2017) atorvastatin 10mg  daily (Sep/2013-Dec/2013) pravastatin 40mg  daily (Oct/2017 - Feb/2019) pravastatin 80mg  daily (Feb/2019 - 12/2017)  Zetia will only lower his LDL 18% and will not bring his LDL anywhere near his goal. He requires Praluent to bring LDL to goal. Will print out this note again so that pt can bring to the [place] for Praluent therapy rationale. If they still do not want to cover Pralent, they should call [facility] [phone].

## 2018-06-29 ENCOUNTER — Encounter (HOSPITAL_COMMUNITY): Payer: Self-pay | Admitting: Cardiology

## 2018-06-29 ENCOUNTER — Ambulatory Visit (HOSPITAL_COMMUNITY)
Admission: RE | Admit: 2018-06-29 | Discharge: 2018-06-29 | Disposition: A | Discharge status: Home / Self Care | Payer: Medicare Other | Source: Ambulatory Visit | Attending: Cardiology | Admitting: Cardiology

## 2018-06-29 ENCOUNTER — Other Ambulatory Visit: Payer: Self-pay

## 2018-06-29 VITALS — BP 119/74 | HR 63 | Wt 213.8 lb

## 2018-06-29 DIAGNOSIS — Z9889 Other specified postprocedural states: Secondary | ICD-10-CM | POA: Diagnosis not present

## 2018-06-29 DIAGNOSIS — I130 Hypertensive heart and chronic kidney disease with heart failure and stage 1 through stage 4 chronic kidney disease, or unspecified chronic kidney disease: Secondary | ICD-10-CM | POA: Diagnosis not present

## 2018-06-29 DIAGNOSIS — E1122 Type 2 diabetes mellitus with diabetic chronic kidney disease: Secondary | ICD-10-CM | POA: Diagnosis not present

## 2018-06-29 DIAGNOSIS — E7800 Pure hypercholesterolemia, unspecified: Secondary | ICD-10-CM

## 2018-06-29 DIAGNOSIS — I2510 Atherosclerotic heart disease of native coronary artery without angina pectoris: Secondary | ICD-10-CM | POA: Insufficient documentation

## 2018-06-29 DIAGNOSIS — I252 Old myocardial infarction: Secondary | ICD-10-CM | POA: Diagnosis not present

## 2018-06-29 DIAGNOSIS — G4733 Obstructive sleep apnea (adult) (pediatric): Secondary | ICD-10-CM | POA: Insufficient documentation

## 2018-06-29 DIAGNOSIS — I25810 Atherosclerosis of coronary artery bypass graft(s) without angina pectoris: Secondary | ICD-10-CM | POA: Diagnosis not present

## 2018-06-29 DIAGNOSIS — I5032 Chronic diastolic (congestive) heart failure: Secondary | ICD-10-CM | POA: Diagnosis not present

## 2018-06-29 DIAGNOSIS — Z6830 Body mass index (BMI) 30.0-30.9, adult: Secondary | ICD-10-CM | POA: Insufficient documentation

## 2018-06-29 DIAGNOSIS — Z79899 Other long term (current) drug therapy: Secondary | ICD-10-CM | POA: Insufficient documentation

## 2018-06-29 DIAGNOSIS — N184 Chronic kidney disease, stage 4 (severe): Secondary | ICD-10-CM | POA: Diagnosis not present

## 2018-06-29 DIAGNOSIS — I480 Paroxysmal atrial fibrillation: Secondary | ICD-10-CM | POA: Diagnosis not present

## 2018-06-29 DIAGNOSIS — Z794 Long term (current) use of insulin: Secondary | ICD-10-CM | POA: Insufficient documentation

## 2018-06-29 DIAGNOSIS — Z7901 Long term (current) use of anticoagulants: Secondary | ICD-10-CM | POA: Insufficient documentation

## 2018-06-29 DIAGNOSIS — E785 Hyperlipidemia, unspecified: Secondary | ICD-10-CM | POA: Diagnosis not present

## 2018-06-29 DIAGNOSIS — Z951 Presence of aortocoronary bypass graft: Secondary | ICD-10-CM | POA: Diagnosis not present

## 2018-06-29 DIAGNOSIS — Z8673 Personal history of transient ischemic attack (TIA), and cerebral infarction without residual deficits: Secondary | ICD-10-CM | POA: Diagnosis not present

## 2018-06-29 DIAGNOSIS — I340 Nonrheumatic mitral (valve) insufficiency: Secondary | ICD-10-CM | POA: Diagnosis not present

## 2018-06-29 DIAGNOSIS — E669 Obesity, unspecified: Secondary | ICD-10-CM | POA: Insufficient documentation

## 2018-06-29 NOTE — Patient Instructions (Signed)
Your physician recommends that you schedule a follow-up appointment in: 6 weeks  

## 2018-06-30 ENCOUNTER — Other Ambulatory Visit: Payer: Self-pay

## 2018-06-30 DIAGNOSIS — N185 Chronic kidney disease, stage 5: Secondary | ICD-10-CM

## 2018-06-30 NOTE — Progress Notes (Signed)
Patient ID: [name], male   DOB: October 14, 1945, 72 y.o.   MRN: [id number] PCP: Dr Harrington Challenger Cardiology: Dr. Aundra Dubin  72 y.o. with history of CAD s/p CABG and obesity s/p lap banding procedure presents for cardiology followup.  Given exertional dyspnea and chest tightness, he had LHC in 1/15.  This showed no change from prior.  Grafts were patent and there was severe stenosis in distal D1, not amenable to PCI.   In 2017, he developed increased exertional dyspnea.  Eventually had a TEE showing severe MR with prolapse of a portion of the anterior mitral valve leaflet.  Coronary angiography in 6/17 showed patent grafts and 90% stenosis of distal diagonal not amenable to intervention.  In 8/17, he had mitral valve repair.  Post-op diastolic CHF, Lasix started and increased.   He had a cath again in 9/18 showing stable coronary anatomy.    He developed atrial arrhythmias in 12/18.  Both fibrillation and flutter were seen.  Saw Dr. Rayann Heman, recommended DCCV with amiodarone or sotalol if fibrillation recurs.  He had DCCV in 1/19.   In 6/19, he was noted to have developed AKI. Creatinine went up to 3.46.  Renal US was done, showing normal kidneys. Cause of AKI was not clear.  We stopped his Lasix. He saw nephrology and eventually went back on Lasix, currently taking 60 mg daily.  He is not using any NSAIDs.    He stopped pravastatin due to myalgias. We tried to get him on a PCSK9-inhibitor through the [place] but apparently he has to take Zetia first.    At last appointment, he reported that several days prior he had had a 30 minute episode of substernal chest heaviness associated with nausea and shoulder pain.  Symptoms resolved after her took NTG x 3.  There was no trigger to the symptoms.  He did not go to the ER.  We checked a BMET that day and creatinine was up to 4.15. Troponin was negative.   I had him do a Agricultural consultant, which showed old inferior MI with no ischemia.  Echo showed EF 55-60%, s/p MV  repair with mean gradient 6 mmHg.     He returns today for followup of CAD and CHF.  He has had no further chest pain.  He remains short of breath walking around the yard.  Dr. Jimmy Footman cut his Lasix back to 40 mg daily.  No orthopnea/PND.  Weight is actually down 4 lbs.  He has been told that he will likely go on HD soon.  It looks like he should be able to get Praluent through the [place].   Labs (11/12): LDL 70 Labs (5/13): K 4.1, creatinine 1.1, BNP 48 Labs (12/13): K 3.4, creatinine 1.07, LDL 59, HDL 37 Labs (1/15): K 4.3, creatinine 1.2, HCT 40.3 Labs (3/15): LDL 38, HDL 81, BNP 55, K 4, creatinine 1.2 Labs (6/15): K 4.2, creatinine 1.4, LFTs normal, LDL 66, HDL 35 Labs (8/16): K 4, creatinine 1.6, BNP 64 Labs (12/16): LDL 61, HDL 37 Labs (4/17): K 4.2, creatinine 1.35, HCT 38.7, BNP 80 Labs (8/17): K 4.2, creatinine 1.4 => 1.66, BNP 159 Labs (12/17): K 3.6, creatinine 1.75 Labs (2/18): LDL 76, HDL 36 Labs (9/18): K 3.9, creatinine 1.76 Labs (1/19): K 4.9, creatinine 1.76, hgb 14 Labs (6/19): K 3.6, creatinine 3.46 Labs (7/19): K 4.1, creatinine 3.66, hgb 12.7 Labs (10/19): LDL 177, hgb 13.4, troponin negative, K 4, creatinine 4.15  PMH: 1. OSA: on CPAP.  2.  Diabetes mellitus 3. Allergic rhinitis 4. CAD: s/p CABG 1995.  LHC (10/11) with 90% distal D1 (patent proximal D1 stent), total occlusion CFX, total occlusion RCA, no significant stenoses in LAD, SVG-PDA patent, SVG-OM patent, EF 55%.  Medical management.  LHC (5/13) with patent grafts and 90% distal D1 stenosis (no significant change from 10/11).  LHC (1/15) with patent grafts, 75% ISR in D1 stent, 90% stenosis distal D1 (small caliber). D1 not amenable to intervention.  - Coronary angiography (6/17): grafts patent, 90% stenosis distally in large diagonal not amenable to intervention.   - LHC (9/18): SVG-OM and SVG-PDA patent,  75% in-stent restenosis in proximal diagonal then 90% stenosis distally (small in caliber at that point),  totally occluded LCx and RCA.  Left coronary system similar to prior caths, D1 may be source of angina.  - Lexiscan Cardiolite (11/19): EF 47%, old inferior MI, no ischemia.  5. Obesity: lap-banding in 5/11.  6. Chronic diastolic CHF: Echo (3/90) with EF 55-60%, moderate LVH, moderate diastolic dysfunction, s/p mitral valve repair with elevated mean gradient but normal pressure half-time.  - Echo (8/18): EF 55-60%, s/p MV repair with mean gradient 6 mmHg, PASP 28 mmHg, mildly decreased RV systolic function.  - Echo (11/19): EF 55-60%, s/p MV repair with mean gradient 6, PASP 42 mmHg, mildly dilated RV, dilated IVC.  7. TIA: Carotid dopplers in 11/11 with 0-39% bilateral stenosis.  Carotid dopplers 6/17 with 1-39% RICA stenosis.  8. OA: Knees, C-spine. TKR 9/13.  9. HTN 10. Hyperlipidemia: Myalgias with atorvastatin and Crestor.  11. Diverticulosis 12. MItral regurgitation: TEE (6/17) with EF 55-60%, severe eccentric MR, prolapse of a portion of the anterior leaflet, peak RV-RA gradient 45 mmHg.  - Mitral valve repair 8/17 13. CKD: Stage III.  14. Atrial fibrillation/atrial flutter: Both arrhythmias noted in 12/18.  DCCV 12/19.   15. AKI 6/19 => CKD stage 4: uncertain etiology.  Renal US was normal.   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  Lives in [place].   Family History  Problem Relation Age of Onset  . Other Mother        joint problems  . Hypertension Father   . Heart disease Father   . CVA Father   . Depression Father   . Heart disease Sister        CABG  . Stroke Sister   . Hypertension Sister   . Congestive Heart Failure Sister   . Diabetes Sister   . Heart disease Brother   . Hypertension Brother   . Congestive Heart Failure Brother     ROS: All systems reviewed and negative except as per HPI.    Current Outpatient Medications  Medication Sig Dispense Refill  . Alirocumab (PRALUENT) 150 MG/ML SOPN Inject 150 mg into the skin every 14  (fourteen) days. 2 pen 11  . amoxicillin (AMOXIL) 500 MG tablet Take 4 tablets (2 g) by mouth 30-60 minutes prior to dental appointment 12 tablet 0  . apixaban (ELIQUIS) 5 MG TABS tablet Take 1 tablet (5 mg total) by mouth 2 (two) times daily. 180 tablet 3  . Artificial Saliva (MOUTH KOTE MT) Use as directed 4 sprays in the mouth or throat every 3 (three) hours as needed (for dry mouth).    Marland Kitchen buPROPion (WELLBUTRIN) 75 MG tablet Take 75 mg by mouth daily.    . carvedilol (COREG) 6.25 MG tablet Take 1 tablet (6.25 mg total) by mouth 2 (two) times daily with a  meal. 180 tablet 3  . cetirizine (ZYRTEC) 10 MG tablet Take 10 mg by mouth daily as needed (for allergies.).     Marland Kitchen Cholecalciferol (VITAMIN D) 2000 units CAPS Take 2,000 Units by mouth daily.    . diphenhydrAMINE (BENADRYL) 25 MG tablet Take 25 mg by mouth at bedtime as needed (for sleep.).    Marland Kitchen fluticasone (FLONASE) 50 MCG/ACT nasal spray Place 1 spray into both nostrils daily as needed for allergies or rhinitis.    . furosemide (LASIX) 20 MG tablet Take 40 mg by mouth daily.    Marland Kitchen HYDROcodone-acetaminophen (NORCO) 10-325 MG tablet Take 1 tablet by mouth every 4 (four) hours as needed for moderate pain. 20 tablet 0  . Hypromellose (ARTIFICIAL TEARS OP) Apply 1 drop to eye 2 (two) times daily as needed (dry eyes).    Marland Kitchen ibuprofen (ADVIL,MOTRIN) 200 MG tablet Take 400-800 mg by mouth every 8 (eight) hours as needed (for knee/back pain or arthritis pain.).    Marland Kitchen isosorbide mononitrate (IMDUR) 30 MG 24 hr tablet Take 1 tablet (30 mg total) by mouth daily. 90 tablet 3  . nitroGLYCERIN (NITROSTAT) 0.4 MG SL tablet PLACE 1 TABLET UNDER THE TONGUE EVERY 5 MINUTES AS NEEDED FOR CHEST PAIN UP TO 3 DOSES, IF SYMPTOMS PERSIST CALL 911 25 tablet 3  . PARoxetine (PAXIL) 40 MG tablet Take 20 mg by mouth daily as needed (PTSD symptoms).    . ranolazine (RANEXA) 500 MG 12 hr tablet Take 1 tablet (500 mg total) by mouth 2 (two) times daily. 180 tablet 3  .  tadalafil (CIALIS) 20 MG tablet Take 20 mg by mouth daily as needed for erectile dysfunction.    Marland Kitchen testosterone cypionate (DEPOTESTOSTERONE CYPIONATE) 200 MG/ML injection Inject 200 mg into the muscle once a week.    . traZODone (DESYREL) 150 MG tablet Take 150 mg by mouth at bedtime.      No current facility-administered medications for this encounter.     BP 119/74   Pulse 63   Wt 97 kg (213 lb 12.8 oz)   SpO2 99%   BMI 30.68 kg/m  General: NAD Neck: JVP 8 cm, no thyromegaly or thyroid nodule.  Lungs: Clear to auscultation bilaterally with normal respiratory effort. CV: Nondisplaced PMI.  Heart regular S1/S2, no S3/S4, no murmur.  Trace ankle edema.  Abdomen: Soft, nontender, no hepatosplenomegaly, no distention.  Skin: Intact without lesions or rashes.  Neurologic: Alert and oriented x 3.  Psych: Normal affect. Extremities: No clubbing or cyanosis.  HEENT: Normal.   Assessment/Plan: 1. S/p mitral valve repair:  Echo 11/19 showed stable mitral valve repair compared to prior echo with no MR, mean gradient 6 mmHg.   - With prosthetic material, should use antibiotic prophylaxis with dental work.  2. CAD:  He has severe stenosis in a distal D1 that is not amenable to intervention.  This has been stable on last couple of caths.  He had a worrisome episode of prolonged chest pain a few days prior to last appointment.  He has had no chest pain since then and was not having pain before.  Cardiolite showed old inferior MI, no ischemia.  He has occluded RCA and LCx known from the past with SVGs to OM and PDA.  EF remains preserved by echo repeat echo.  - With no ischemia by Cardiolite and no current ACS, would not do cardiac cath given creatinine most recently of 4.1.  - No ASA with apixaban use.  - Continue Coreg,  ranolazine, and Imdur.  If he goes on HD, should probably stop ranolazine.  QTc on last ECG was 418 msec.  - He cannot tolerate statins with myalgias.  He initially was turned down  for PCSK9-inhibitor by the [place].  I sent him to our lipid clinic and they are now trying to work with the [place] to get him on Praluent. LDL is very high.  This needs to be done as soon as possible.  3. Hyperlipidemia: Myalgias with Crestor, pravastatin, and atorvastatin.  LDL 177.  See discussion above regarding effort to get him on Praluent.  4. Chronic diastolic CHF:  With renal dysfunction, volume retention has been worsening.  Most recent echo showed higher PA pressure and dilated IVC.  NYHA class II-III symptoms.  - Lasix 40 mg daily.  With CKD stage IV, Dr. Jimmy Footman has been adjusting.    5. OSA: Continue CPAP.  6. Atrial fibrillation: Paroxysmal, in NSR after DCCV in 1/19.  If recurs, may need amiodarone to maintain NSR.  - Continue apixaban 5 mg bid. CBC today.  - Ranolazine will likely decrease risk of recurrence.  7. CKD stage 4: AKI without recovery, uncertain etiology.  He sees nephrology, there is concern that he is progressing towards HD.   Followup 6 wks unless he has further chest pain.   [name] 06/30/2018

## 2018-07-01 ENCOUNTER — Other Ambulatory Visit: Payer: Self-pay | Admitting: *Deleted

## 2018-07-01 DIAGNOSIS — G4733 Obstructive sleep apnea (adult) (pediatric): Secondary | ICD-10-CM | POA: Diagnosis not present

## 2018-07-06 ENCOUNTER — Encounter: Payer: Self-pay | Admitting: Vascular Surgery

## 2018-07-06 ENCOUNTER — Other Ambulatory Visit: Payer: Self-pay

## 2018-07-06 ENCOUNTER — Ambulatory Visit (INDEPENDENT_AMBULATORY_CARE_PROVIDER_SITE_OTHER)
Admission: RE | Admit: 2018-07-06 | Discharge: 2018-07-06 | Disposition: A | Discharge status: Home / Self Care | Payer: Medicare Other | Source: Ambulatory Visit | Attending: Vascular Surgery | Admitting: Vascular Surgery

## 2018-07-06 ENCOUNTER — Ambulatory Visit (HOSPITAL_COMMUNITY)
Admission: RE | Admit: 2018-07-06 | Discharge: 2018-07-06 | Disposition: A | Discharge status: Home / Self Care | Payer: Medicare Other | Source: Ambulatory Visit | Attending: Vascular Surgery | Admitting: Vascular Surgery

## 2018-07-06 ENCOUNTER — Ambulatory Visit (INDEPENDENT_AMBULATORY_CARE_PROVIDER_SITE_OTHER): Payer: Medicare Other | Admitting: Vascular Surgery

## 2018-07-06 VITALS — BP 137/84 | HR 68 | Temp 97.6°F | Resp 16 | Wt 213.0 lb

## 2018-07-06 DIAGNOSIS — I208 Other forms of angina pectoris: Secondary | ICD-10-CM

## 2018-07-06 DIAGNOSIS — N184 Chronic kidney disease, stage 4 (severe): Secondary | ICD-10-CM

## 2018-07-06 DIAGNOSIS — N185 Chronic kidney disease, stage 5: Secondary | ICD-10-CM

## 2018-07-06 NOTE — Progress Notes (Signed)
REASON FOR CONSULT:    To evaluate for hemodialysis access.  The consult is requested by Rosalyn Charters.   HPI:   [name] is a pleasant 72 y.o. male, who is referred for evaluation for hemodialysis access.  I have reviewed the records from the referring office.  The patient was seen on 06/14/2018.  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  The patient also has a history of secondary hyperparathyroidism.  Of note in the referring notes we were asked to not place an AV graft with an AV fistula was not possible.  On my history, the patient denies any recent uremic symptoms.  Specifically, he denies nausea, vomiting, fatigue, anorexia, or palpitations.  Of note he is on Eliquis for atrial fibrillation.  He is right-handed.  Past Medical History:  Diagnosis Date  . Allergic rhinitis   . Anxiety   . Cancer ([place])    hx of skin cancers   . Chronic kidney disease   . Coronary artery disease    a. s/p MI & CABG; b. s/p PCI Diag;  c. Cath 12/2011: LAD diff dzs/small, Diag 75% isr, 90 dist to stent (small), LCX & RCA occluded, VG->OM 40, VG->PDA patent - med rx.  . Depression   . Diabetes mellitus    ORAL MEDS  . Diastolic CHF, chronic (HCC)    a. EF 55-60% by echo 2007  . Dysrhythmia    "abnormal beat"  . GERD (gastroesophageal reflux disease)    "not anymore" " I had a lap band"  . History of diverticulitis of colon    5 YRS AGO  . History of pneumonia    2017  . History of transient ischemic attack (TIA)    CAROTID DOPPLERS NOV 2011  0-39& BIL. STENOSIS  . Hyperlipidemia   . Hypertension   . Mitral regurgitation   . Myocardial infarction ([place])   . Neuromuscular disorder ([place])    left arm numbness   . 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 PTSD (post-traumatic stress disorder)    from [place]  . 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 Shortness of breath dyspnea    with exertion  . Sleep apnea    cpap- see ov note in EPIC 05/12/11 for settings     Family History  Problem Relation Age of Onset  . Other Mother        joint problems  . Hypertension Father   . Heart disease Father   . CVA Father   . Depression Father   . Heart disease Sister        CABG  . Stroke Sister   . Hypertension Sister   . Congestive Heart Failure Sister   . Diabetes Sister   . Heart disease Brother   . Hypertension Brother   . Congestive Heart Failure Brother     SOCIAL HISTORY: Social History   Socioeconomic History  . Marital status: Married    Spouse name: Not on file  . Number of children: Not on file  . Years of education: Not on file  . Highest education level: Not on file  Occupational History  . Not on file  Social Needs  . Financial resource strain: Not on file  .  Food insecurity:    Worry: Not on file    Inability: Not on file  . Transportation needs:    Medical: Not on file    Non-medical: Not on file  Tobacco Use  . Smoking status: Never Smoker  . Smokeless tobacco: Never Used  Substance and Sexual Activity  . Alcohol use: Yes    Comment: OCCASIONAL  . Drug use: No  . Sexual activity: Not on file  Lifestyle  . Physical activity:    Days per week: Not on file    Minutes per session: Not on file  . Stress: Not on file  Relationships  . Social connections:    Talks on phone: Not on file    Gets together: Not on file    Attends religious service: Not on file    Active member of club or organization: Not on file    Attends meetings of clubs or organizations: Not on file    Relationship status: Not on file  . Intimate partner violence:    Fear of current or ex partner: Not on file    Emotionally abused: Not on file    Physically abused: Not on file     Forced sexual activity: Not on file  Other Topics Concern  . Not on file  Social History Narrative  . Not on file    Allergies  Allergen Reactions  . Codeine Itching and Other (See Comments)    Extremity tingling-- can take synthetic  . Crestor [Rosuvastatin] Other (See Comments)    Muscle aches   . Lipitor [Atorvastatin] Other (See Comments)    Muscle aches  . Pravastatin     Muscle aches    Current Outpatient Medications  Medication Sig Dispense Refill  . Alirocumab (PRALUENT) 150 MG/ML SOPN Inject 150 mg into the skin every 14 (fourteen) days. (Patient taking differently: Inject 150 mg into the skin every 14 (fourteen) days. Not taking yet but will soon.) 2 pen 11  . amoxicillin (AMOXIL) 500 MG tablet Take 4 tablets (2 g) by mouth 30-60 minutes prior to dental appointment 12 tablet 0  . apixaban (ELIQUIS) 5 MG TABS tablet Take 1 tablet (5 mg total) by mouth 2 (two) times daily. 180 tablet 3  . Artificial Saliva (MOUTH KOTE MT) Use as directed 4 sprays in the mouth or throat every 3 (three) hours as needed (for dry mouth).    Marland Kitchen buPROPion (WELLBUTRIN) 75 MG tablet Take 75 mg by mouth daily.    . calcitRIOL (ROCALTROL) 0.25 MCG capsule Take 0.25 mcg by mouth daily.    . carvedilol (COREG) 6.25 MG tablet Take 1 tablet (6.25 mg total) by mouth 2 (two) times daily with a meal. 180 tablet 3  . cetirizine (ZYRTEC) 10 MG tablet Take 10 mg by mouth daily as needed (for allergies.).     Marland Kitchen diphenhydrAMINE (BENADRYL) 25 MG tablet Take 25 mg by mouth at bedtime as needed (for sleep.).    Marland Kitchen fluticasone (FLONASE) 50 MCG/ACT nasal spray Place 1 spray into both nostrils daily as needed for allergies or rhinitis.    . furosemide (LASIX) 20 MG tablet Take 40 mg by mouth daily.    Marland Kitchen HYDROcodone-acetaminophen (NORCO) 10-325 MG tablet Take 1 tablet by mouth every 4 (four) hours as needed for moderate pain. 20 tablet 0  . Hypromellose (ARTIFICIAL TEARS OP) Apply 1 drop to eye 2 (two) times daily as  needed (dry eyes).    Marland Kitchen ibuprofen (ADVIL,MOTRIN) 200 MG tablet Take 400-800  mg by mouth every 8 (eight) hours as needed (for knee/back pain or arthritis pain.).    Marland Kitchen isosorbide mononitrate (IMDUR) 30 MG 24 hr tablet Take 1 tablet (30 mg total) by mouth daily. 90 tablet 3  . nitroGLYCERIN (NITROSTAT) 0.4 MG SL tablet PLACE 1 TABLET UNDER THE TONGUE EVERY 5 MINUTES AS NEEDED FOR CHEST PAIN UP TO 3 DOSES, IF SYMPTOMS PERSIST CALL 911 25 tablet 3  . PARoxetine (PAXIL) 40 MG tablet Take 20 mg by mouth daily as needed (PTSD symptoms).    . ranolazine (RANEXA) 500 MG 12 hr tablet Take 1 tablet (500 mg total) by mouth 2 (two) times daily. 180 tablet 3  . tadalafil (CIALIS) 20 MG tablet Take 20 mg by mouth daily as needed for erectile dysfunction.    Marland Kitchen testosterone cypionate (DEPOTESTOSTERONE CYPIONATE) 200 MG/ML injection Inject 200 mg into the muscle once a week.    . traZODone (DESYREL) 150 MG tablet Take 150 mg by mouth at bedtime.      No current facility-administered medications for this visit.     REVIEW OF SYSTEMS:  [X]  denotes positive finding, [ ]  denotes negative finding Cardiac  Comments:  Chest pain or chest pressure: x   Shortness of breath upon exertion: x   Short of breath when lying flat:    Irregular heart rhythm: x       Vascular    Pain in calf, thigh, or hip brought on by ambulation:    Pain in feet at night that wakes you up from your sleep:     Blood clot in your veins:    Leg swelling:         Pulmonary    Oxygen at home:    Productive cough:     Wheezing:         Neurologic    Sudden weakness in arms or legs:     Sudden numbness in arms or legs:     Sudden onset of difficulty speaking or slurred speech:    Temporary loss of vision in one eye:     Problems with dizziness:         Gastrointestinal    Blood in stool:     Vomited blood:         Genitourinary    Burning when urinating:     Blood in urine:        Psychiatric    Major depression:           Hematologic    Bleeding problems:    Problems with blood clotting too easily:        Skin    Rashes or ulcers:        Constitutional    Fever or chills:     PHYSICAL EXAM:   Vitals:   07/06/18 1550  BP: 137/84  Pulse: 68  Resp: 16  Temp: 97.6 F (36.4 C)  TempSrc: Oral  SpO2: 99%  Weight: 213 lb (96.6 kg)    GENERAL: The patient is a well-nourished male, in no acute distress. The vital signs are documented above. CARDIAC: There is a regular rate and rhythm.  VASCULAR: I do not detect carotid bruits. He has a palpable brachial and radial pulse bilaterally. On my exam it appears that his forearm cephalic vein on the left enters into his basilic system.  Despite the marginal size on his vein map I think the left forearm cephalic vein does look reasonable for a fistula. PULMONARY: There is  good air exchange bilaterally without wheezing or rales. ABDOMEN: Soft and non-tender with normal pitched bowel sounds.  MUSCULOSKELETAL: There are no major deformities or cyanosis. NEUROLOGIC: No focal weakness or paresthesias are detected. SKIN: There are no ulcers or rashes noted. PSYCHIATRIC: The patient has a normal affect.  DATA:    BILATERAL UPPER EXTREMITY VEIN MAP: I have independently interpreted his upper extremity vein map.  On the right side the forearm cephalic vein is marginal in size.  The upper arm cephalic vein does not appear to be adequate.  The basilic vein on the right looks reasonable in size.  On the left side the forearm cephalic vein is marginal in size.  The upper arm cephalic vein does not appear to be adequate.  The basilic vein on the left appears reasonable in size.  UPPER EXTREMITY ARTERIAL DUPLEX: I have independently interpreted his upper extremity arterial duplex scan.  On the right side there is a triphasic radial and ulnar signal.  Brachial artery measures 0.61 cm in maximum diameter.  On the left side there is a triphasic radial and ulnar signal.   The brachial artery measures 0.64 cm in diameter.  LABS: I have reviewed the most recent labs dated 06/14/2018.  This shows a GFR of 13.  ASSESSMENT & PLAN:   STAGE IV CHRONIC KIDNEY DISEASE: This patient has stage IV chronic kidney disease.  We have been asked to place an AV fistula and not place an AV graft if the fistula is not possible.  I think he is a reasonable candidate for attempting a left radiocephalic AV fistula on.  I think the cephalic system on the left empties into the basilic system.  The vein is somewhat lateral so this would likely require 2 separate incisions and a small tunnel.  I have discussed with him the indications for the procedure and the potential complications including but not limited to failure of the fistula to mature, the need for subsequent interventions, and steal symptoms.  All his questions were answered and he will call to schedule this in the near future.  Of note we will need to hold his Eliquis preoperatively for 48 hours.   Deitra Mayo [facility] [phone]

## 2018-07-06 NOTE — H&P (View-Only) (Signed)
REASON FOR CONSULT:    To evaluate for hemodialysis access.  The consult is requested by Rosalyn Charters.   HPI:   [name] is a pleasant 72 y.o. male, who is referred for evaluation for hemodialysis access.  I have reviewed the records from the referring office.  The patient was seen on 06/14/2018.  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  The patient also has a history of secondary hyperparathyroidism.  Of note in the referring notes we were asked to not place an AV graft with an AV fistula was not possible.  On my history, the patient denies any recent uremic symptoms.  Specifically, he denies nausea, vomiting, fatigue, anorexia, or palpitations.  Of note he is on Eliquis for atrial fibrillation.  He is right-handed.  Past Medical History:  Diagnosis Date  . Allergic rhinitis   . Anxiety   . Cancer ([place])    hx of skin cancers   . Chronic kidney disease   . Coronary artery disease    a. s/p MI & CABG; b. s/p PCI Diag;  c. Cath 12/2011: LAD diff dzs/small, Diag 75% isr, 90 dist to stent (small), LCX & RCA occluded, VG->OM 40, VG->PDA patent - med rx.  . Depression   . Diabetes mellitus    ORAL MEDS  . Diastolic CHF, chronic (HCC)    a. EF 55-60% by echo 2007  . Dysrhythmia    "abnormal beat"  . GERD (gastroesophageal reflux disease)    "not anymore" " I had a lap band"  . History of diverticulitis of colon    5 YRS AGO  . History of pneumonia    2017  . History of transient ischemic attack (TIA)    CAROTID DOPPLERS NOV 2011  0-39& BIL. STENOSIS  . Hyperlipidemia   . Hypertension   . Mitral regurgitation   . Myocardial infarction ([place])   . Neuromuscular disorder ([place])    left arm numbness   . 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 PTSD (post-traumatic stress disorder)    from [place]  . 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 Shortness of breath dyspnea    with exertion  . Sleep apnea    cpap- see ov note in EPIC 05/12/11 for settings     Family History  Problem Relation Age of Onset  . Other Mother        joint problems  . Hypertension Father   . Heart disease Father   . CVA Father   . Depression Father   . Heart disease Sister        CABG  . Stroke Sister   . Hypertension Sister   . Congestive Heart Failure Sister   . Diabetes Sister   . Heart disease Brother   . Hypertension Brother   . Congestive Heart Failure Brother     SOCIAL HISTORY: Social History   Socioeconomic History  . Marital status: Married    Spouse name: Not on file  . Number of children: Not on file  . Years of education: Not on file  . Highest education level: Not on file  Occupational History  . Not on file  Social Needs  . Financial resource strain: Not on file  .  Food insecurity:    Worry: Not on file    Inability: Not on file  . Transportation needs:    Medical: Not on file    Non-medical: Not on file  Tobacco Use  . Smoking status: Never Smoker  . Smokeless tobacco: Never Used  Substance and Sexual Activity  . Alcohol use: Yes    Comment: OCCASIONAL  . Drug use: No  . Sexual activity: Not on file  Lifestyle  . Physical activity:    Days per week: Not on file    Minutes per session: Not on file  . Stress: Not on file  Relationships  . Social connections:    Talks on phone: Not on file    Gets together: Not on file    Attends religious service: Not on file    Active member of club or organization: Not on file    Attends meetings of clubs or organizations: Not on file    Relationship status: Not on file  . Intimate partner violence:    Fear of current or ex partner: Not on file    Emotionally abused: Not on file    Physically abused: Not on file     Forced sexual activity: Not on file  Other Topics Concern  . Not on file  Social History Narrative  . Not on file    Allergies  Allergen Reactions  . Codeine Itching and Other (See Comments)    Extremity tingling-- can take synthetic  . Crestor [Rosuvastatin] Other (See Comments)    Muscle aches   . Lipitor [Atorvastatin] Other (See Comments)    Muscle aches  . Pravastatin     Muscle aches    Current Outpatient Medications  Medication Sig Dispense Refill  . Alirocumab (PRALUENT) 150 MG/ML SOPN Inject 150 mg into the skin every 14 (fourteen) days. (Patient taking differently: Inject 150 mg into the skin every 14 (fourteen) days. Not taking yet but will soon.) 2 pen 11  . amoxicillin (AMOXIL) 500 MG tablet Take 4 tablets (2 g) by mouth 30-60 minutes prior to dental appointment 12 tablet 0  . apixaban (ELIQUIS) 5 MG TABS tablet Take 1 tablet (5 mg total) by mouth 2 (two) times daily. 180 tablet 3  . Artificial Saliva (MOUTH KOTE MT) Use as directed 4 sprays in the mouth or throat every 3 (three) hours as needed (for dry mouth).    Marland Kitchen buPROPion (WELLBUTRIN) 75 MG tablet Take 75 mg by mouth daily.    . calcitRIOL (ROCALTROL) 0.25 MCG capsule Take 0.25 mcg by mouth daily.    . carvedilol (COREG) 6.25 MG tablet Take 1 tablet (6.25 mg total) by mouth 2 (two) times daily with a meal. 180 tablet 3  . cetirizine (ZYRTEC) 10 MG tablet Take 10 mg by mouth daily as needed (for allergies.).     Marland Kitchen diphenhydrAMINE (BENADRYL) 25 MG tablet Take 25 mg by mouth at bedtime as needed (for sleep.).    Marland Kitchen fluticasone (FLONASE) 50 MCG/ACT nasal spray Place 1 spray into both nostrils daily as needed for allergies or rhinitis.    . furosemide (LASIX) 20 MG tablet Take 40 mg by mouth daily.    Marland Kitchen HYDROcodone-acetaminophen (NORCO) 10-325 MG tablet Take 1 tablet by mouth every 4 (four) hours as needed for moderate pain. 20 tablet 0  . Hypromellose (ARTIFICIAL TEARS OP) Apply 1 drop to eye 2 (two) times daily as  needed (dry eyes).    Marland Kitchen ibuprofen (ADVIL,MOTRIN) 200 MG tablet Take 400-800  mg by mouth every 8 (eight) hours as needed (for knee/back pain or arthritis pain.).    Marland Kitchen isosorbide mononitrate (IMDUR) 30 MG 24 hr tablet Take 1 tablet (30 mg total) by mouth daily. 90 tablet 3  . nitroGLYCERIN (NITROSTAT) 0.4 MG SL tablet PLACE 1 TABLET UNDER THE TONGUE EVERY 5 MINUTES AS NEEDED FOR CHEST PAIN UP TO 3 DOSES, IF SYMPTOMS PERSIST CALL 911 25 tablet 3  . PARoxetine (PAXIL) 40 MG tablet Take 20 mg by mouth daily as needed (PTSD symptoms).    . ranolazine (RANEXA) 500 MG 12 hr tablet Take 1 tablet (500 mg total) by mouth 2 (two) times daily. 180 tablet 3  . tadalafil (CIALIS) 20 MG tablet Take 20 mg by mouth daily as needed for erectile dysfunction.    Marland Kitchen testosterone cypionate (DEPOTESTOSTERONE CYPIONATE) 200 MG/ML injection Inject 200 mg into the muscle once a week.    . traZODone (DESYREL) 150 MG tablet Take 150 mg by mouth at bedtime.      No current facility-administered medications for this visit.     REVIEW OF SYSTEMS:  [X]  denotes positive finding, [ ]  denotes negative finding Cardiac  Comments:  Chest pain or chest pressure: x   Shortness of breath upon exertion: x   Short of breath when lying flat:    Irregular heart rhythm: x       Vascular    Pain in calf, thigh, or hip brought on by ambulation:    Pain in feet at night that wakes you up from your sleep:     Blood clot in your veins:    Leg swelling:         Pulmonary    Oxygen at home:    Productive cough:     Wheezing:         Neurologic    Sudden weakness in arms or legs:     Sudden numbness in arms or legs:     Sudden onset of difficulty speaking or slurred speech:    Temporary loss of vision in one eye:     Problems with dizziness:         Gastrointestinal    Blood in stool:     Vomited blood:         Genitourinary    Burning when urinating:     Blood in urine:        Psychiatric    Major depression:           Hematologic    Bleeding problems:    Problems with blood clotting too easily:        Skin    Rashes or ulcers:        Constitutional    Fever or chills:     PHYSICAL EXAM:   Vitals:   07/06/18 1550  BP: 137/84  Pulse: 68  Resp: 16  Temp: 97.6 F (36.4 C)  TempSrc: Oral  SpO2: 99%  Weight: 213 lb (96.6 kg)    GENERAL: The patient is a well-nourished male, in no acute distress. The vital signs are documented above. CARDIAC: There is a regular rate and rhythm.  VASCULAR: I do not detect carotid bruits. He has a palpable brachial and radial pulse bilaterally. On my exam it appears that his forearm cephalic vein on the left enters into his basilic system.  Despite the marginal size on his vein map I think the left forearm cephalic vein does look reasonable for a fistula. PULMONARY: There is  good air exchange bilaterally without wheezing or rales. ABDOMEN: Soft and non-tender with normal pitched bowel sounds.  MUSCULOSKELETAL: There are no major deformities or cyanosis. NEUROLOGIC: No focal weakness or paresthesias are detected. SKIN: There are no ulcers or rashes noted. PSYCHIATRIC: The patient has a normal affect.  DATA:    BILATERAL UPPER EXTREMITY VEIN MAP: I have independently interpreted his upper extremity vein map.  On the right side the forearm cephalic vein is marginal in size.  The upper arm cephalic vein does not appear to be adequate.  The basilic vein on the right looks reasonable in size.  On the left side the forearm cephalic vein is marginal in size.  The upper arm cephalic vein does not appear to be adequate.  The basilic vein on the left appears reasonable in size.  UPPER EXTREMITY ARTERIAL DUPLEX: I have independently interpreted his upper extremity arterial duplex scan.  On the right side there is a triphasic radial and ulnar signal.  Brachial artery measures 0.61 cm in maximum diameter.  On the left side there is a triphasic radial and ulnar signal.   The brachial artery measures 0.64 cm in diameter.  LABS: I have reviewed the most recent labs dated 06/14/2018.  This shows a GFR of 13.  ASSESSMENT & PLAN:   STAGE IV CHRONIC KIDNEY DISEASE: This patient has stage IV chronic kidney disease.  We have been asked to place an AV fistula and not place an AV graft if the fistula is not possible.  I think he is a reasonable candidate for attempting a left radiocephalic AV fistula on.  I think the cephalic system on the left empties into the basilic system.  The vein is somewhat lateral so this would likely require 2 separate incisions and a small tunnel.  I have discussed with him the indications for the procedure and the potential complications including but not limited to failure of the fistula to mature, the need for subsequent interventions, and steal symptoms.  All his questions were answered and he will call to schedule this in the near future.  Of note we will need to hold his Eliquis preoperatively for 48 hours.   Deitra Mayo [facility] [phone]

## 2018-07-07 DIAGNOSIS — R972 Elevated prostate specific antigen [PSA]: Secondary | ICD-10-CM | POA: Diagnosis not present

## 2018-07-07 DIAGNOSIS — E291 Testicular hypofunction: Secondary | ICD-10-CM | POA: Diagnosis not present

## 2018-07-08 ENCOUNTER — Other Ambulatory Visit: Payer: Self-pay | Admitting: *Deleted

## 2018-08-01 ENCOUNTER — Other Ambulatory Visit: Payer: Self-pay

## 2018-08-01 ENCOUNTER — Encounter (HOSPITAL_COMMUNITY): Payer: Self-pay | Admitting: *Deleted

## 2018-08-01 NOTE — Anesthesia Preprocedure Evaluation (Addendum)
Anesthesia Evaluation  Patient identified by MRN, date of birth, ID band Patient awake    Reviewed: Allergy & Precautions, NPO status , Patient's Chart, lab work & pertinent test results  History of Anesthesia Complications Negative for: history of anesthetic complications  Airway Mallampati: II  TM Distance: >3 FB Neck ROM: Full    Dental  (+) Caps, Dental Advisory Given   Pulmonary sleep apnea and Continuous Positive Airway Pressure Ventilation ,    Pulmonary exam normal        Cardiovascular hypertension, + CAD, + Past MI and + CABG  Normal cardiovascular exam+ dysrhythmias Atrial Fibrillation   Narrative   Nuclear stress EF: 47%.  There was no ST segment deviation noted during stress. Show more   Findings consistent with prior myocardial infarction.  This is an intermediate risk study.  The left ventricular ejection fraction is mildly decreased (45-54%).  Moderate sized inferior wall infarct from apex to base No ischemia EF 47% with inferior and apical hypokinesis Intermediate risk study due to EF   Impressions:  - Normal LV size with mild LV hypertrophy. EF 55-60% with basal   inferior hypokinesis. Mildly dilated RV with normal systolic   function. S/p mitral valve repair with mildly elevated gradient   across the valve. Mild pulmonary hypertension with dilated IVC.    . s/p MI & CABG; b. s/p PCI Diag;  c. Cath 12/2011: LAD diff dzs/small, Diag 75% isr, 90 dist to stent (small), LCX & RCA occluded, VG->OM 40, VG->PDA patent - med rx.   Neuro/Psych PSYCHIATRIC DISORDERS Anxiety Depression negative neurological ROS     GI/Hepatic Neg liver ROS, GERD  ,  Endo/Other  negative endocrine ROSdiabetes  Renal/GU Renal disease     Musculoskeletal negative musculoskeletal ROS (+)   Abdominal   Peds  Hematology negative hematology ROS (+)   Anesthesia Other Findings Day of surgery medications reviewed  with the patient.  Reproductive/Obstetrics                            Anesthesia Physical Anesthesia Plan  ASA: III  Anesthesia Plan: MAC   Post-op Pain Management:    Induction: Intravenous  PONV Risk Score and Plan: 2 and Ondansetron and Propofol infusion  Airway Management Planned: Natural Airway and Simple Face Mask  Additional Equipment:   Intra-op Plan:   Post-operative Plan:   Informed Consent: I have reviewed the patients History and Physical, chart, labs and discussed the procedure including the risks, benefits and alternatives for the proposed anesthesia with the patient or authorized representative who has indicated his/her understanding and acceptance.   Dental advisory given  Plan Discussed with: CRNA and Anesthesiologist  Anesthesia Plan Comments:        Anesthesia Quick Evaluation

## 2018-08-01 NOTE — Progress Notes (Signed)
Anesthesia Chart Review: [name]  Case:  [id number] Date/Time:  08/02/18 0707   Procedure:  ARTERIOVENOUS (AV) FISTULA CREATION RADIOCEPHALIC (Right )   Anesthesia type:  Monitor Anesthesia Care   Pre-op diagnosis:  chronic kidney disease stage 5   Location:  [facility] OR ROOM 11 / [place] [facility]   Surgeon:  Angelia Mould, MD      DISCUSSION: Patient is a 72 year old male scheduled for the above procedure.  History includes CAD/MI (s/p CABG: SVG-OM-SVG-PDA '95; s/p PCI DIAG '99), severe mitral regurgitation (s/p minimally invasive MV repair with complex valvuloplasty and Sorin Memo 3D 26 mm Ring annuloplasty 03/25/16), afib/flutter (08/11/17, s/p DCCV 09/07/17), HTN, chronic diastolic CHF, HLD, DM2, OSA (CPAP), GERD, TIA '11, PTSD ([place] War), left arm numbness, CKD with AKI, exertional dyspnea, obesity (s/p laparoscopic gastric banding).  Patient with extensive cardiac history, but with recent cardiology follow-up last month following stress test. No plans for repeat LHC at that time. (See PROVIDERS section for details). Patient is a same day work-up, so labs and evaluation on the day of surgery. He is not sure of his current DM control--last A1c 6.1 in June 2019 [facility]), but now off metformin due to CKD. He hasn't found an endocrinologist yet. He checks CBGs intermittently--had one of 199 before breakfast within the last week.   If no acute changes and labs acceptable then I am anticipating that he can proceed as planned. He reported being instructed to hold Eliquis for 48 hours prior to procedure.   PROVIDERS: [name], MD is PCP. Notes indicate that he may receive some care at the [facility] as well.  [name], MD is HF cardiologist. Last visit 06/29/18. No further chest pain since 06/24/18 stress test. No good PCI options for severe D1 disease by 2018 cath. Aware that patient may need to start hemodialysis in the near future. He will consider stopping ranolazine if HD started. Needs  antibiotic prophylaxis for dental procedures. Six week follow-up planned. Thompson Grayer, MD is EP cardiologist.  Baird Lyons, MD is pulmonologist. Last visit 06/20/18.  - Deterding, Jeneen Rinks, MD is nephrologist. Reportedly, he stopped patient's metformin due to CKD and referred patient to an endocrinologist, but patient has not seen one yet.    LABS: He is for labs on the day of surgery. Cr 4.15, K 4.1, glucose 156, H/H 13.4/41.7 on 06/17/18.   PFTs 03/23/16: FVC 3.07 (69%), FEV1 2.33 (69%), DLCOunc 18.94 (58%).    IMAGES: U/S Renal 02/16/18: IMPRESSION: Kidneys appear normal.  Mild prostatic enlargement is noted.  CXR 08/11/17: IMPRESSION: No active cardiopulmonary disease.   EKG: 06/17/18: SR with occasional PVCs.   CV: Echo 06/24/18: Study Conclusions - Left ventricle: The cavity size was normal. Wall thickness was   increased in a pattern of mild LVH. Systolic function was normal.   The estimated ejection fraction was in the range of 55% to 60%.   Basal inferior hypokinesis. Indeterminant diastolic function. - Aortic valve: There was no stenosis. - Aorta: Mildly dilated ascending aorta. Ascending aortic diameter:   38 mm (S). - Mitral valve: S/p mitral valve repair. Mildly elevated gradient   across the repaired valve. There was no significant   regurgitation. Mean gradient: 6 mmHg. Valve area by pressure   half-time: 1.57 cm^2. - Left atrium: The atrium was moderately dilated. - Right ventricle: The cavity size was mildly dilated. Systolic   function was normal. - Right atrium: The atrium was mildly dilated. -  Tricuspid valve: Peak RV-RA gradient (S): 27 mm Hg. - Pulmonary arteries: PA peak pressure: 42 mm Hg (S). - Systemic veins: IVC measured 2.3 cm with < 50% respirophasic   variation, suggesting RA pressure 15 mmHg. Impressions: - Normal LV size with mild LV hypertrophy. EF 55-60% with basal   inferior hypokinesis. Mildly dilated RV with normal systolic    function. S/p mitral valve repair with mildly elevated gradient   across the valve. Mild pulmonary hypertension with dilated IVC.  Nuclear stress test 06/24/18:  Nuclear stress EF: 47%.  There was no ST segment deviation noted during stress.  Findings consistent with prior myocardial infarction.  This is an intermediate risk study.  The left ventricular ejection fraction is mildly decreased (45-54%). Moderate sized inferior wall infarct from apex to base No ischemia EF 47% with inferior and apical hypokinesis Intermediate risk study due to EF  (Results reviewed by [name], MD who wrote, "EF 47% with old inferior infarct, no ischemia. This is similar to the study from 2017. Would not proceed to cath with elevated Creatinine.")  Cardiac cath 04/29/17: Left Main: 20% distal left main narrowing. Left Anterior Descending: Give off a large, branching 1st diagonal that is comparable in size to the continuation of the LAD. The LAD beyond D1 has luminal irregularities. D1 has a stent just after the take-off of a proximal branch. There is 75% in-stent restenosis in the proximal portion of the stent. Beyond the stent in the distal D1, there is a 90% stenosis. The vessel is small in caliber at this point. The proximal branch off the D1 has a 50% ostial stenosis. These findings are similar to the prior study.  Left Circumflex: Totally occluded proximally. SVG-OM is patent with 40% stenosis in the mid-graft and 40% stenosis in the distal graft.  Right Coronary Artery: Native RCA known to be occluded. SVG-PDA is patient and also backfills PLV. 30% proximal graft stenosis. CONCLUSION: 1. Patent SVG-OM and SVG-PDA.  2. LCA system looks similar to prior cath in 6/17.  D1 is a likely source for angina.  No good interventional option here, will continue to treat medically.  He has been started on Imdur and has not had any further chest pain.  I warned him about avoiding Cialis while using Imdur.  3.  Creatinine at his baseline, 1.7.  Will hydrate prior to discharge.   14 Day Cardiac event monitor 08/25/16-09/07/16:  Study Highlights: Predominantly NSR. A few PVCs noted.  Carotid U/S 03/23/16:  Summary: - Carotid Duplex Evaluation - No significant extracranial carotid artery stenosis demonstrated -1-39% category. Vertebral arteries demonstrated patent antegrade flow.   Past Medical History:  Diagnosis Date  . Allergic rhinitis   . Anxiety   . Atrial fibrillation ([place])   . Cancer ([place])    hx of skin cancers   . Chronic kidney disease   . Coronary artery disease    a. s/p MI & CABG; b. s/p PCI Diag;  c. Cath 12/2011: LAD diff dzs/small, Diag 75% isr, 90 dist to stent (small), LCX & RCA occluded, VG->OM 40, VG->PDA patent - med rx.  . Depression    PTSD  . Diabetes mellitus    not on medications  . Diastolic CHF, chronic (HCC)    a. EF 55-60% by echo 2007  . Dysrhythmia    "abnormal beat"  . GERD (gastroesophageal reflux disease)    "not anymore" " I had a lap band"  . History of diverticulitis of colon  5 YRS AGO  . History of pneumonia    2017  . History of transient ischemic attack (TIA)    CAROTID DOPPLERS NOV 2011  0-39& BIL. STENOSIS  . Hyperlipidemia   . Hypertension   . Mitral regurgitation   . Myocardial infarction ([place])   . Neuromuscular disorder ([place])    left arm numbness   . 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 PTSD (post-traumatic stress disorder)    from [place]  . 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 Shortness of breath dyspnea    with exertion  . Sleep apnea    cpap- see ov note in [facility] 05/12/11 for settings     Past Surgical History:  Procedure Laterality Date  . BLEPHAROPLASTY  1985    BILATERAL  . CARDIAC CATHETERIZATION  2001, 2004, 2009, 2011  . CARDIAC CATHETERIZATION N/A 01/23/2016   Procedure: Right/Left Heart Cath and Coronary Angiography;  Surgeon: [name], MD;  Location: [place] CV LAB;  Service: Cardiovascular;  Laterality: N/A;  . CARDIOVERSION N/A 09/07/2017   Procedure: CARDIOVERSION;  Surgeon: [name], MD;  Location: [facility] ENDOSCOPY;  Service: Cardiovascular;  Laterality: N/A;  . CATARACT EXTRACTION W/ INTRAOCULAR LENS IMPLANT Bilateral   . CHONDROPLASTY  07/22/2011   Procedure: CHONDROPLASTY;  Surgeon: Dione Plover Aluisio;  Location: [place];  Service: Orthopedics;;  . CIRCUMCISION  35 YRS  AGO  . COLONOSCOPY    . CORONARY ANGIOPLASTY  1996-- POST CABG   W/ STENT, last cath 01/07/2012   . CORONARY ARTERY BYPASS GRAFT  1995   X3 VESSEL  . CORONARY STENT PLACEMENT    . KNEE ARTHROSCOPY  07/22/2011   Procedure: ARTHROSCOPY KNEE;  Surgeon: [name];  Location: [place];  Service: Orthopedics;  Laterality: Right;  WITH DEBRIDEMENT   . KNEE ARTHROSCOPY W/ MENISCECTOMY  X2 IN 2002-- RIGHT KNEE  . LAPAROSCOPIC GASTRIC BANDING  01-15-09  . LEFT HEART CATH AND CORONARY ANGIOGRAPHY N/A 04/29/2017   Procedure: LEFT HEART CATH AND CORONARY ANGIOGRAPHY;  Surgeon: [name], MD;  Location: [place] CV LAB;  Service: Cardiovascular;  Laterality: N/A;  . LEFT HEART CATHETERIZATION WITH CORONARY ANGIOGRAM N/A 09/11/2013   Procedure: LEFT HEART CATHETERIZATION WITH CORONARY ANGIOGRAM;  Surgeon: [name], MD;  Location[facility] C[facility] LAB;  Service: Cardiovascular;  Laterality: N/A;  . MITRAL VALVE REPAIR Right 03/25/2016   Procedure: MINIMALLY INVASIVE REOPERATION FOR MITRAL VALVE REPAIR  (MVR) with size 26 Sorin Memo 3D;  Surgeon: [name], MD;  Location: [place];  Service: Open Heart Surgery;  Laterality: Right;  . RIGHT KNEE ED COMPARTMENT REPLACEMENT  2004  . SYNOVECTOMY  07/22/2011   Procedure:  SYNOVECTOMY;  Surgeon: [name];  Location: [place];  Service: Orthopedics;;  . TEE WITHOUT CARDIOVERSION N/A 01/23/2016   Procedure: TRANSESOPHAGEAL ECHOCARDIOGRAM (TEE);  Surgeon: [name], MD;  Location: [place];  Service: Cardiovascular;  Laterality: N/A;  . TEE WITHOUT CARDIOVERSION N/A 03/25/2016   Procedure: TRANSESOPHAGEAL ECHOCARDIOGRAM (TEE);  Surgeon: [name], MD;  Location: [place];  Service: Open Heart Surgery;  Laterality: N/A;  . UMBILICAL HERNIA REPAIR  01-15-09   W/ GASTRIC BANDING PROCEDURE    MEDICATIONS: No current  facility-administered medications for this encounter.    [place] Kitchen amoxicillin (AMOXIL) 500 MG tablet  . apixaban (ELIQUIS) 5 MG TABS tablet  . Artificial Saliva (MOUTH KOTE MT)  . calcitRIOL (ROCALTROL) 0.25 MCG capsule  . carvedilol (COREG) 3.125 MG tablet  . cetirizine (ZYRTEC) 10 MG tablet  . fluticasone (FLONASE) 50 MCG/ACT nasal spray  . furosemide (LASIX) 80 MG tablet  . HYDROcodone-acetaminophen (NORCO/VICODIN) 5-325 MG tablet  . Hypromellose (ARTIFICIAL TEARS OP)  . ibuprofen (ADVIL,MOTRIN) 200 MG tablet  . isosorbide mononitrate (IMDUR) 30 MG 24 hr tablet  . nitroGLYCERIN (NITROSTAT) 0.4 MG SL tablet  . potassium chloride SA (K-DUR,KLOR-CON) 20 MEQ tablet  . ranolazine (RANEXA) 500 MG 12 hr tablet  . tadalafil (CIALIS) 20 MG tablet  . testosterone cypionate (DEPOTESTOSTERONE CYPIONATE) 200 MG/ML injection  . traZODone (DESYREL) 100 MG tablet  . Alirocumab (PRALUENT) 150 MG/ML SOPN  . carvedilol (COREG) 6.25 MG tablet    George Hugh [facility] Short Stay Center/Anesthesiology Phone [phone] 08/01/2018 1:23 PM

## 2018-08-01 NOTE — Progress Notes (Signed)
Spoke with pt's wife, [name] for pre-op call. DPR on file. Pt has extensive cardiac history with CAD, CABG, A-fib, stents and Mitral Valve repair. Pt's cardiologist is Dr. Aundra Dubin. Pt is on Eliquis and was instructed to stop it 48 hours prior to surgery, last dose per Katharine Look was 07/29/18 after his PM dose. Pt is a type 2 diabetic. Katharine Look states Dr. Jimmy Footman took him off of Metformin and told pt to get an Endocrinologist. Katharine Look states that they have not found one yet. Pt is not taking any medications and she states he really doesn't want to take any. She states he checks his blood sugar randomly. She states is was 199 the other day before he ate breakfast. I have called Dr. Melinda Crutch' office to get pt's last A1C that was done in June, 2019. Instructed Katharine Look to have pt check his blood sugar in the AM when he gets up. If blood sugar is 70 or below, treat with 1/2 cup of clear juice (apple or cranberry) and recheck blood sugar 15 minutes after drinking juice. Katharine Look voiced understanding.

## 2018-08-02 ENCOUNTER — Ambulatory Visit (HOSPITAL_COMMUNITY)
Admission: RE | Admit: 2018-08-02 | Discharge: 2018-08-02 | Disposition: A | Discharge status: Home / Self Care | Payer: Medicare Other | Source: Ambulatory Visit | Attending: Vascular Surgery | Admitting: Vascular Surgery

## 2018-08-02 ENCOUNTER — Encounter (HOSPITAL_COMMUNITY): Payer: Self-pay | Admitting: Surgery

## 2018-08-02 ENCOUNTER — Ambulatory Visit (HOSPITAL_COMMUNITY): Payer: Medicare Other | Admitting: Vascular Surgery

## 2018-08-02 ENCOUNTER — Encounter (HOSPITAL_COMMUNITY)
Admission: RE | Disposition: A | Discharge status: Home / Self Care | Payer: Self-pay | Source: Ambulatory Visit | Attending: Vascular Surgery

## 2018-08-02 DIAGNOSIS — N2581 Secondary hyperparathyroidism of renal origin: Secondary | ICD-10-CM | POA: Diagnosis not present

## 2018-08-02 DIAGNOSIS — E785 Hyperlipidemia, unspecified: Secondary | ICD-10-CM | POA: Insufficient documentation

## 2018-08-02 DIAGNOSIS — K219 Gastro-esophageal reflux disease without esophagitis: Secondary | ICD-10-CM | POA: Diagnosis not present

## 2018-08-02 DIAGNOSIS — I5032 Chronic diastolic (congestive) heart failure: Secondary | ICD-10-CM | POA: Insufficient documentation

## 2018-08-02 DIAGNOSIS — Z955 Presence of coronary angioplasty implant and graft: Secondary | ICD-10-CM | POA: Insufficient documentation

## 2018-08-02 DIAGNOSIS — Z79899 Other long term (current) drug therapy: Secondary | ICD-10-CM | POA: Diagnosis not present

## 2018-08-02 DIAGNOSIS — I4891 Unspecified atrial fibrillation: Secondary | ICD-10-CM | POA: Diagnosis not present

## 2018-08-02 DIAGNOSIS — Z951 Presence of aortocoronary bypass graft: Secondary | ICD-10-CM | POA: Insufficient documentation

## 2018-08-02 DIAGNOSIS — N185 Chronic kidney disease, stage 5: Secondary | ICD-10-CM | POA: Diagnosis not present

## 2018-08-02 DIAGNOSIS — I130 Hypertensive heart and chronic kidney disease with heart failure and stage 1 through stage 4 chronic kidney disease, or unspecified chronic kidney disease: Secondary | ICD-10-CM | POA: Diagnosis not present

## 2018-08-02 DIAGNOSIS — E1122 Type 2 diabetes mellitus with diabetic chronic kidney disease: Secondary | ICD-10-CM | POA: Insufficient documentation

## 2018-08-02 DIAGNOSIS — Z8249 Family history of ischemic heart disease and other diseases of the circulatory system: Secondary | ICD-10-CM | POA: Insufficient documentation

## 2018-08-02 DIAGNOSIS — G4730 Sleep apnea, unspecified: Secondary | ICD-10-CM | POA: Insufficient documentation

## 2018-08-02 DIAGNOSIS — I252 Old myocardial infarction: Secondary | ICD-10-CM | POA: Insufficient documentation

## 2018-08-02 DIAGNOSIS — M1990 Unspecified osteoarthritis, unspecified site: Secondary | ICD-10-CM | POA: Insufficient documentation

## 2018-08-02 DIAGNOSIS — Z7901 Long term (current) use of anticoagulants: Secondary | ICD-10-CM | POA: Insufficient documentation

## 2018-08-02 DIAGNOSIS — I132 Hypertensive heart and chronic kidney disease with heart failure and with stage 5 chronic kidney disease, or end stage renal disease: Secondary | ICD-10-CM | POA: Diagnosis not present

## 2018-08-02 DIAGNOSIS — Z823 Family history of stroke: Secondary | ICD-10-CM | POA: Diagnosis not present

## 2018-08-02 DIAGNOSIS — I2510 Atherosclerotic heart disease of native coronary artery without angina pectoris: Secondary | ICD-10-CM | POA: Diagnosis not present

## 2018-08-02 DIAGNOSIS — Z8673 Personal history of transient ischemic attack (TIA), and cerebral infarction without residual deficits: Secondary | ICD-10-CM | POA: Insufficient documentation

## 2018-08-02 DIAGNOSIS — N184 Chronic kidney disease, stage 4 (severe): Secondary | ICD-10-CM | POA: Diagnosis not present

## 2018-08-02 HISTORY — PX: AV FISTULA PLACEMENT: SHX1204

## 2018-08-02 HISTORY — DX: Unspecified atrial fibrillation: I48.91

## 2018-08-02 LAB — PROTIME-INR
INR: 1.13
Prothrombin Time: 14.4 seconds (ref 11.4–15.2)

## 2018-08-02 LAB — POCT I-STAT 4, (NA,K, GLUC, HGB,HCT)
Glucose, Bld: 178 mg/dL — ABNORMAL HIGH (ref 70–99)
HEMATOCRIT: 38 % — AB (ref 39.0–52.0)
Hemoglobin: 12.9 g/dL — ABNORMAL LOW (ref 13.0–17.0)
POTASSIUM: 3.4 mmol/L — AB (ref 3.5–5.1)
Sodium: 137 mmol/L (ref 135–145)

## 2018-08-02 LAB — GLUCOSE, CAPILLARY: Glucose-Capillary: 171 mg/dL — ABNORMAL HIGH (ref 70–99)

## 2018-08-02 SURGERY — ARTERIOVENOUS (AV) FISTULA CREATION
Anesthesia: Monitor Anesthesia Care | Laterality: Left

## 2018-08-02 MED ORDER — HEPARIN SODIUM (PORCINE) 1000 UNIT/ML IJ SOLN
INTRAMUSCULAR | Status: AC
  Filled 2018-08-02: qty 1

## 2018-08-02 MED ORDER — LIDOCAINE HCL (PF) 1 % IJ SOLN
INTRAMUSCULAR | Status: AC
  Filled 2018-08-02: qty 30

## 2018-08-02 MED ORDER — MIDAZOLAM HCL 2 MG/2ML IJ SOLN
INTRAMUSCULAR | Status: DC | PRN
  Administered 2018-08-02: 1 mg via INTRAVENOUS

## 2018-08-02 MED ORDER — PROPOFOL 500 MG/50ML IV EMUL
INTRAVENOUS | Status: DC | PRN
  Administered 2018-08-02: 100 ug/kg/min via INTRAVENOUS

## 2018-08-02 MED ORDER — SODIUM CHLORIDE 0.9 % IV SOLN
INTRAVENOUS | Status: DC | PRN
  Administered 2018-08-02: 07:00:00

## 2018-08-02 MED ORDER — FENTANYL CITRATE (PF) 250 MCG/5ML IJ SOLN
INTRAMUSCULAR | Status: AC
  Filled 2018-08-02: qty 5

## 2018-08-02 MED ORDER — PROPOFOL 10 MG/ML IV BOLUS
INTRAVENOUS | Status: AC
  Filled 2018-08-02: qty 20

## 2018-08-02 MED ORDER — PROTAMINE SULFATE 10 MG/ML IV SOLN
INTRAVENOUS | Status: AC
  Filled 2018-08-02: qty 5

## 2018-08-02 MED ORDER — HEPARIN SODIUM (PORCINE) 1000 UNIT/ML IJ SOLN
INTRAMUSCULAR | Status: DC | PRN
  Administered 2018-08-02: 9000 [IU] via INTRAVENOUS

## 2018-08-02 MED ORDER — LIDOCAINE-EPINEPHRINE 0.5 %-1:200000 IJ SOLN
INTRAMUSCULAR | Status: AC
  Filled 2018-08-02: qty 1

## 2018-08-02 MED ORDER — CEFAZOLIN SODIUM-DEXTROSE 2-4 GM/100ML-% IV SOLN
2.0000 g | INTRAVENOUS | Status: AC
  Administered 2018-08-02: 2 g via INTRAVENOUS
  Filled 2018-08-02 (×2): qty 100

## 2018-08-02 MED ORDER — ONDANSETRON HCL 4 MG/2ML IJ SOLN
INTRAMUSCULAR | Status: AC
  Filled 2018-08-02: qty 2

## 2018-08-02 MED ORDER — PHENYLEPHRINE 40 MCG/ML (10ML) SYRINGE FOR IV PUSH (FOR BLOOD PRESSURE SUPPORT)
PREFILLED_SYRINGE | INTRAVENOUS | Status: AC
  Filled 2018-08-02: qty 10

## 2018-08-02 MED ORDER — PHENYLEPHRINE 40 MCG/ML (10ML) SYRINGE FOR IV PUSH (FOR BLOOD PRESSURE SUPPORT)
PREFILLED_SYRINGE | INTRAVENOUS | Status: DC | PRN
  Administered 2018-08-02 (×2): 80 ug via INTRAVENOUS
  Administered 2018-08-02: 40 ug via INTRAVENOUS

## 2018-08-02 MED ORDER — PROTAMINE SULFATE 10 MG/ML IV SOLN
INTRAVENOUS | Status: DC | PRN
  Administered 2018-08-02: 40 mg via INTRAVENOUS

## 2018-08-02 MED ORDER — PROPOFOL 1000 MG/100ML IV EMUL
INTRAVENOUS | Status: AC
  Filled 2018-08-02: qty 100

## 2018-08-02 MED ORDER — MIDAZOLAM HCL 2 MG/2ML IJ SOLN
INTRAMUSCULAR | Status: AC
  Filled 2018-08-02: qty 2

## 2018-08-02 MED ORDER — FENTANYL CITRATE (PF) 100 MCG/2ML IJ SOLN
INTRAMUSCULAR | Status: DC | PRN
  Administered 2018-08-02: 50 ug via INTRAVENOUS

## 2018-08-02 MED ORDER — 0.9 % SODIUM CHLORIDE (POUR BTL) OPTIME
TOPICAL | Status: DC | PRN
  Administered 2018-08-02: 1000 mL

## 2018-08-02 MED ORDER — ONDANSETRON HCL 4 MG/2ML IJ SOLN
INTRAMUSCULAR | Status: DC | PRN
  Administered 2018-08-02: 4 mg via INTRAVENOUS

## 2018-08-02 MED ORDER — LIDOCAINE HCL (PF) 1 % IJ SOLN
INTRAMUSCULAR | Status: DC | PRN
  Administered 2018-08-02: 30 mL

## 2018-08-02 MED ORDER — SODIUM CHLORIDE 0.9 % IV SOLN
INTRAVENOUS | Status: AC
  Filled 2018-08-02: qty 1.2

## 2018-08-02 MED ORDER — SODIUM CHLORIDE 0.9 % IV SOLN
INTRAVENOUS | Status: DC
  Administered 2018-08-02: 07:00:00 via INTRAVENOUS

## 2018-08-02 SURGICAL SUPPLY — 40 items
ADH SKN CLS APL DERMABOND .7 (GAUZE/BANDAGES/DRESSINGS) ×1
ARMBAND PINK RESTRICT EXTREMIT (MISCELLANEOUS) ×6 IMPLANT
CANISTER SUCT 3000ML PPV (MISCELLANEOUS) ×3 IMPLANT
CANNULA VESSEL 3MM 2 BLNT TIP (CANNULA) ×3 IMPLANT
CLIP VESOCCLUDE MED 6/CT (CLIP) ×3 IMPLANT
CLIP VESOCCLUDE SM WIDE 6/CT (CLIP) ×3 IMPLANT
COVER PROBE W GEL 5X96 (DRAPES) IMPLANT
COVER WAND RF STERILE (DRAPES) ×3 IMPLANT
DECANTER SPIKE VIAL GLASS SM (MISCELLANEOUS) ×3 IMPLANT
DERMABOND ADVANCED (GAUZE/BANDAGES/DRESSINGS) ×2
DERMABOND ADVANCED .7 DNX12 (GAUZE/BANDAGES/DRESSINGS) ×1 IMPLANT
ELECT REM PT RETURN 9FT ADLT (ELECTROSURGICAL) ×3
ELECTRODE REM PT RTRN 9FT ADLT (ELECTROSURGICAL) ×1 IMPLANT
GLOVE BIO SURGEON STRL SZ7.5 (GLOVE) ×3 IMPLANT
GLOVE BIOGEL PI IND STRL 6.5 (GLOVE) IMPLANT
GLOVE BIOGEL PI IND STRL 7.0 (GLOVE) IMPLANT
GLOVE BIOGEL PI IND STRL 7.5 (GLOVE) IMPLANT
GLOVE BIOGEL PI IND STRL 8 (GLOVE) ×1 IMPLANT
GLOVE BIOGEL PI INDICATOR 6.5 (GLOVE) ×4
GLOVE BIOGEL PI INDICATOR 7.0 (GLOVE) ×2
GLOVE BIOGEL PI INDICATOR 7.5 (GLOVE) ×2
GLOVE BIOGEL PI INDICATOR 8 (GLOVE) ×4
GLOVE ECLIPSE 6.5 STRL STRAW (GLOVE) ×2 IMPLANT
GLOVE ECLIPSE 7.0 STRL STRAW (GLOVE) ×2 IMPLANT
GLOVE SURG SS PI 6.5 STRL IVOR (GLOVE) ×2 IMPLANT
GOWN STRL REUS W/ TWL LRG LVL3 (GOWN DISPOSABLE) ×3 IMPLANT
GOWN STRL REUS W/TWL LRG LVL3 (GOWN DISPOSABLE) ×12
KIT BASIN OR (CUSTOM PROCEDURE TRAY) ×3 IMPLANT
KIT TURNOVER KIT B (KITS) ×3 IMPLANT
NS IRRIG 1000ML POUR BTL (IV SOLUTION) ×3 IMPLANT
PACK CV ACCESS (CUSTOM PROCEDURE TRAY) ×3 IMPLANT
PAD ARMBOARD 7.5X6 YLW CONV (MISCELLANEOUS) ×6 IMPLANT
SPONGE SURGIFOAM ABS GEL 100 (HEMOSTASIS) IMPLANT
SUT PROLENE 6 0 BV (SUTURE) ×3 IMPLANT
SUT VIC AB 3-0 SH 27 (SUTURE) ×6
SUT VIC AB 3-0 SH 27X BRD (SUTURE) ×1 IMPLANT
SUT VICRYL 4-0 PS2 18IN ABS (SUTURE) ×5 IMPLANT
TOWEL GREEN STERILE (TOWEL DISPOSABLE) ×3 IMPLANT
UNDERPAD 30X30 (UNDERPADS AND DIAPERS) ×3 IMPLANT
WATER STERILE IRR 1000ML POUR (IV SOLUTION) ×3 IMPLANT

## 2018-08-02 NOTE — Anesthesia Postprocedure Evaluation (Signed)
Anesthesia Post Note  Patient: [name]  Procedure(s) Performed: ARTERIOVENOUS (AV) FISTULA CREATION RADIOCEPHALIC (Left )     Patient location during evaluation: PACU Anesthesia Type: MAC Level of consciousness: awake and alert Pain management: pain level controlled Vital Signs Assessment: post-procedure vital signs reviewed and stable Respiratory status: spontaneous breathing and respiratory function stable Cardiovascular status: stable Postop Assessment: no apparent nausea or vomiting Anesthetic complications: no    Last Vitals:  Vitals:   08/02/18 0915 08/02/18 0933  BP:  124/80  Pulse: 77 73  Resp: (!) 21 19  Temp:    SpO2: 99% 98%    Last Pain:  Vitals:   08/02/18 0933  TempSrc:   PainSc: 0-No pain                 Yifan Auker DANIEL

## 2018-08-02 NOTE — Op Note (Signed)
    NAME: [name]    MRN: [id number] DOB: 04-29-1946    DATE OF OPERATION: 08/02/2018  PREOP DIAGNOSIS:    Stage IV chronic kidney disease  POSTOP DIAGNOSIS:    Same  PROCEDURE:    Left radiocephalic AV fistula  SURGEON: Judeth Cornfield. Scot Dock, MD, FACS  ASSIST: Laurence Slate, PA  ANESTHESIA: Local with sedation  EBL: Minimal  INDICATIONS:    [name] is a 72 y.o. male who presents for new access.  He has not yet on dialysis.  FINDINGS:   3 mm forearm cephalic vein  TECHNIQUE:   The patient was taken to the operating room and sedated by anesthesia.  The left upper extremity was prepped and draped in the usual sterile fashion.  The vein was lateral almost on the anterior aspect of the forearm.. I therefore elected to make an incision over the radial artery and a separate incision over the vein after the skin was anesthetized.  The vein was dissected free and branches were divided between clips and 3-0 silk ties.  This was ligated distally and irrigated up nicely with heparinized saline.  The artery was dissected free beneath the fascia.  A tunnel was created between the 2 incisions and the patient was heparinized.  The radial artery was clamped proximally and distally and a longitudinal arteriotomy was made.  The vein was sewn into side to the artery using continuous 6-0 Prolene suture.  Prior to completing anastomosis I did pass a 2 dilator proximally and distally through the anastomosis and the anastomosis was completed.  There was an excellent thrill in the fistula.  Hemostasis was obtained in the wounds.  The wounds were closed with a deep layer of 3-0 Vicryl to skin closed with 4-0 Vicryl.  Dermabond was applied.  The patient tolerated the procedure well was transferred to the recovery room in stable condition.  All needle and sponge counts were correct.  Deitra Mayo, MD, FACS [facility]  DATE OF DICTATION:    08/02/2018

## 2018-08-02 NOTE — Discharge Instructions (Signed)
° °  [facility] [place] ° °Discharge Instructions ° °AV Fistula or Graft Surgery for Dialysis Access ° °Please refer to the following instructions for your post-procedure care. Your surgeon or physician assistant will discuss any changes with you. ° °Activity ° °You may drive the day following your surgery, if you are comfortable and no longer taking prescription pain medication. Resume full activity as the soreness in your incision resolves. ° °Bathing/Showering ° °You may shower after you go home. Keep your incision dry for 48 hours. Do not soak in a bathtub, hot tub, or swim until the incision heals completely. You may not shower if you have a hemodialysis catheter. ° °Incision Care ° °Clean your incision with mild soap and water after 48 hours. Pat the area dry with a clean towel. You do not need a bandage unless otherwise instructed. Do not apply any ointments or creams to your incision. You may have skin glue on your incision. Do not peel it off. It will come off on its own in about one week. Your arm may swell a bit after surgery. To reduce swelling use pillows to elevate your arm so it is above your heart. Your doctor will tell you if you need to lightly wrap your arm with an ACE bandage. ° °Diet ° °Resume your normal diet. There are not special food restrictions following this procedure. In order to heal from your surgery, it is CRITICAL to get adequate nutrition. Your body requires vitamins, minerals, and protein. Vegetables are the best source of vitamins and minerals. Vegetables also provide the perfect balance of protein. Processed food has little nutritional value, so try to avoid this. ° °Medications ° °Resume taking all of your medications. If your incision is causing pain, you may take over-the counter pain relievers such as acetaminophen (Tylenol). If you were prescribed a stronger pain medication, please be aware these medications can cause nausea and constipation. Prevent  nausea by taking the medication with a snack or meal. Avoid constipation by drinking plenty of fluids and eating foods with high amount of fiber, such as fruits, vegetables, and grains. Do not take Tylenol if you are taking prescription pain medications. ° ° ° ° °Follow up °Your surgeon may want to see you in the office following your access surgery. If so, this will be arranged at the time of your surgery. ° °Please call us immediately for any of the following conditions: ° °Increased pain, redness, drainage (pus) from your incision site °Fever of 101 degrees or higher °Severe or worsening pain at your incision site °Hand pain or numbness. ° °Reduce your risk of vascular disease: ° °Stop smoking. If you would like help, call [facility] at 1-800-QUIT-NOW (1-800-784-8669) or [place] at 336-586-4000 ° °Manage your cholesterol °Maintain a desired weight °Control your diabetes °Keep your blood pressure down ° °Dialysis ° °It will take several weeks to several months for your new dialysis access to be ready for use. Your surgeon will determine when it is OK to use it. Your nephrologist will continue to direct your dialysis. You can continue to use your Permcath until your new access is ready for use. ° °If you have any questions, please call the office at [phone]. ° °

## 2018-08-02 NOTE — Transfer of Care (Signed)
Immediate Anesthesia Transfer of Care Note  Patient: [name]  Procedure(s) Performed: ARTERIOVENOUS (AV) FISTULA CREATION RADIOCEPHALIC (Left )  Patient Location: PACU  Anesthesia Type:MAC  Level of Consciousness: awake, alert  and oriented  Airway & Oxygen Therapy: Patient Spontanous Breathing  Post-op Assessment: Report given to RN  Post vital signs: Reviewed and stable  Last Vitals:  Vitals Value Taken Time  BP 109/77 08/02/2018  8:53 AM  Temp    Pulse 79 08/02/2018  8:53 AM  Resp 31 08/02/2018  8:53 AM  SpO2 99 % 08/02/2018  8:53 AM  Vitals shown include unvalidated device data.  Last Pain:  Vitals:   08/02/18 0638  TempSrc:   PainSc: 0-No pain      Patients Stated Pain Goal: 4 (62/83/66 [id number])  Complications: No apparent anesthesia complications

## 2018-08-02 NOTE — Interval H&P Note (Signed)
History and Physical Interval Note:  08/02/2018 7:18 AM  [name]  has presented today for surgery, with the diagnosis of chronic kidney disease stage 5  The various methods of treatment have been discussed with the patient and family. After consideration of risks, benefits and other options for treatment, the patient has consented to  Procedure(s): ARTERIOVENOUS (AV) FISTULA CREATION RADIOCEPHALIC (Left) as a surgical intervention .  The patient's history has been reviewed, patient examined, no change in status, stable for surgery.  I have reviewed the patient's chart and labs.  Questions were answered to the patient's satisfaction.     Deitra Mayo

## 2018-08-02 NOTE — Anesthesia Procedure Notes (Signed)
Procedure Name: MAC Date/Time: 08/02/2018 7:32 AM Performed by: Barrington Ellison, CRNA Pre-anesthesia Checklist: Patient identified, Emergency Drugs available, Suction available, Patient being monitored and Timeout performed Patient Re-evaluated:Patient Re-evaluated prior to induction Oxygen Delivery Method: Simple face mask

## 2018-08-03 ENCOUNTER — Encounter (HOSPITAL_COMMUNITY): Payer: Self-pay | Admitting: Vascular Surgery

## 2018-08-03 ENCOUNTER — Telehealth: Payer: Self-pay | Admitting: Vascular Surgery

## 2018-08-03 ENCOUNTER — Telehealth: Payer: Self-pay | Admitting: Internal Medicine

## 2018-08-03 DIAGNOSIS — G4733 Obstructive sleep apnea (adult) (pediatric): Secondary | ICD-10-CM

## 2018-08-03 NOTE — Addendum Note (Signed)
Addended by: Parke Poisson E on: 08/03/2018 09:40 AM   Modules accepted: Orders

## 2018-08-03 NOTE — Telephone Encounter (Signed)
sch appt spk to pt mld ltr 09/14/2018 3pm Dialysis Duplex 4pm p/o PA

## 2018-08-03 NOTE — Telephone Encounter (Signed)
Second HST ordered so that results can be scanned/attached to the order

## 2018-08-03 NOTE — Telephone Encounter (Signed)
[name] picked up the HST machine on 06/29/2018 to do the sleep study that night. While he was here he mentioned having to do another sleep study for [name] for his appliance fitting. I explained to him that we have had patients that would take him the HST machine and do the first night without CPAP or appliance and the 2nd night with the appliance. [name] liked this idea and I asked [name] if it was ok for him to do this instead of having to schedule the sleep study with [name]. [name] stated most certainly that would be just fine. So the [name] did exactly that the 1st night without his appliance to prove he still needed the appliance. The 2nd night he used his appliance to see how much [name] would need to adjust his appliance.

## 2018-08-03 NOTE — Telephone Encounter (Signed)
-----   Message from Mena Goes, RN sent at 08/02/2018  9:23 AM EST ----- Regarding: 5-6 weeks with duplex and PA   ----- Message ----- From: Ulyses Amor, PA-C Sent: 08/02/2018   8:42 AM EST To: [facility] Admin Pool, [facility] Charge Pool  S/p left radial Cephalic av fistula needs f/u in 5-6 weeks with fistula duplex in [place] clinic on a Wed.

## 2018-08-12 ENCOUNTER — Encounter (HOSPITAL_COMMUNITY): Payer: Self-pay

## 2018-08-15 DIAGNOSIS — M9902 Segmental and somatic dysfunction of thoracic region: Secondary | ICD-10-CM | POA: Diagnosis not present

## 2018-08-15 DIAGNOSIS — M5032 Other cervical disc degeneration, mid-cervical region, unspecified level: Secondary | ICD-10-CM | POA: Diagnosis not present

## 2018-08-15 DIAGNOSIS — M5414 Radiculopathy, thoracic region: Secondary | ICD-10-CM | POA: Diagnosis not present

## 2018-08-15 DIAGNOSIS — M9903 Segmental and somatic dysfunction of lumbar region: Secondary | ICD-10-CM | POA: Diagnosis not present

## 2018-08-15 DIAGNOSIS — M9901 Segmental and somatic dysfunction of cervical region: Secondary | ICD-10-CM | POA: Diagnosis not present

## 2018-08-15 DIAGNOSIS — M5386 Other specified dorsopathies, lumbar region: Secondary | ICD-10-CM | POA: Diagnosis not present

## 2018-08-22 DIAGNOSIS — M5414 Radiculopathy, thoracic region: Secondary | ICD-10-CM | POA: Diagnosis not present

## 2018-08-22 DIAGNOSIS — M9902 Segmental and somatic dysfunction of thoracic region: Secondary | ICD-10-CM | POA: Diagnosis not present

## 2018-08-22 DIAGNOSIS — M9901 Segmental and somatic dysfunction of cervical region: Secondary | ICD-10-CM | POA: Diagnosis not present

## 2018-08-22 DIAGNOSIS — M9903 Segmental and somatic dysfunction of lumbar region: Secondary | ICD-10-CM | POA: Diagnosis not present

## 2018-08-22 DIAGNOSIS — M5032 Other cervical disc degeneration, mid-cervical region, unspecified level: Secondary | ICD-10-CM | POA: Diagnosis not present

## 2018-08-22 DIAGNOSIS — M5386 Other specified dorsopathies, lumbar region: Secondary | ICD-10-CM | POA: Diagnosis not present

## 2018-08-23 ENCOUNTER — Ambulatory Visit (HOSPITAL_COMMUNITY)
Admission: RE | Admit: 2018-08-23 | Discharge: 2018-08-23 | Disposition: A | Discharge status: Home / Self Care | Payer: Medicare Other | Source: Ambulatory Visit | Attending: Cardiology | Admitting: Cardiology

## 2018-08-23 ENCOUNTER — Telehealth: Payer: Self-pay | Admitting: Pharmacist

## 2018-08-23 ENCOUNTER — Encounter (HOSPITAL_COMMUNITY): Payer: Self-pay | Admitting: Cardiology

## 2018-08-23 VITALS — BP 132/74 | HR 71 | Wt 215.0 lb

## 2018-08-23 DIAGNOSIS — G4733 Obstructive sleep apnea (adult) (pediatric): Secondary | ICD-10-CM | POA: Diagnosis not present

## 2018-08-23 DIAGNOSIS — I2510 Atherosclerotic heart disease of native coronary artery without angina pectoris: Secondary | ICD-10-CM | POA: Diagnosis not present

## 2018-08-23 DIAGNOSIS — I480 Paroxysmal atrial fibrillation: Secondary | ICD-10-CM | POA: Diagnosis not present

## 2018-08-23 DIAGNOSIS — E785 Hyperlipidemia, unspecified: Secondary | ICD-10-CM | POA: Diagnosis not present

## 2018-08-23 DIAGNOSIS — I252 Old myocardial infarction: Secondary | ICD-10-CM | POA: Insufficient documentation

## 2018-08-23 DIAGNOSIS — Z951 Presence of aortocoronary bypass graft: Secondary | ICD-10-CM | POA: Insufficient documentation

## 2018-08-23 DIAGNOSIS — E1122 Type 2 diabetes mellitus with diabetic chronic kidney disease: Secondary | ICD-10-CM | POA: Diagnosis not present

## 2018-08-23 DIAGNOSIS — Z9889 Other specified postprocedural states: Secondary | ICD-10-CM | POA: Diagnosis not present

## 2018-08-23 DIAGNOSIS — I491 Atrial premature depolarization: Secondary | ICD-10-CM | POA: Insufficient documentation

## 2018-08-23 DIAGNOSIS — Z833 Family history of diabetes mellitus: Secondary | ICD-10-CM | POA: Diagnosis not present

## 2018-08-23 DIAGNOSIS — Z8673 Personal history of transient ischemic attack (TIA), and cerebral infarction without residual deficits: Secondary | ICD-10-CM | POA: Diagnosis not present

## 2018-08-23 DIAGNOSIS — I130 Hypertensive heart and chronic kidney disease with heart failure and stage 1 through stage 4 chronic kidney disease, or unspecified chronic kidney disease: Secondary | ICD-10-CM | POA: Insufficient documentation

## 2018-08-23 DIAGNOSIS — Z8249 Family history of ischemic heart disease and other diseases of the circulatory system: Secondary | ICD-10-CM | POA: Insufficient documentation

## 2018-08-23 DIAGNOSIS — N179 Acute kidney failure, unspecified: Secondary | ICD-10-CM | POA: Diagnosis not present

## 2018-08-23 DIAGNOSIS — I25810 Atherosclerosis of coronary artery bypass graft(s) without angina pectoris: Secondary | ICD-10-CM

## 2018-08-23 DIAGNOSIS — Z6830 Body mass index (BMI) 30.0-30.9, adult: Secondary | ICD-10-CM | POA: Insufficient documentation

## 2018-08-23 DIAGNOSIS — E119 Type 2 diabetes mellitus without complications: Secondary | ICD-10-CM

## 2018-08-23 DIAGNOSIS — I5032 Chronic diastolic (congestive) heart failure: Secondary | ICD-10-CM | POA: Insufficient documentation

## 2018-08-23 DIAGNOSIS — N184 Chronic kidney disease, stage 4 (severe): Secondary | ICD-10-CM | POA: Diagnosis not present

## 2018-08-23 DIAGNOSIS — N185 Chronic kidney disease, stage 5: Secondary | ICD-10-CM

## 2018-08-23 DIAGNOSIS — Z79899 Other long term (current) drug therapy: Secondary | ICD-10-CM | POA: Diagnosis not present

## 2018-08-23 DIAGNOSIS — E669 Obesity, unspecified: Secondary | ICD-10-CM | POA: Insufficient documentation

## 2018-08-23 DIAGNOSIS — Z7901 Long term (current) use of anticoagulants: Secondary | ICD-10-CM | POA: Insufficient documentation

## 2018-08-23 LAB — BASIC METABOLIC PANEL
Anion gap: 11 (ref 5–15)
BUN: 39 mg/dL — ABNORMAL HIGH (ref 8–23)
CO2: 27 mmol/L (ref 22–32)
CREATININE: 3.88 mg/dL — AB (ref 0.61–1.24)
Calcium: 9 mg/dL (ref 8.9–10.3)
Chloride: 97 mmol/L — ABNORMAL LOW (ref 98–111)
GFR calc Af Amer: 17 mL/min — ABNORMAL LOW (ref >60)
GFR calc non Af Amer: 14 mL/min — ABNORMAL LOW (ref >60)
GLUCOSE: 182 mg/dL — AB (ref 70–99)
Potassium: 4.3 mmol/L (ref 3.5–5.1)
Sodium: 135 mmol/L (ref 135–145)

## 2018-08-23 LAB — CBC
HCT: 42 % (ref 39.0–52.0)
Hemoglobin: 14.2 g/dL (ref 13.0–17.0)
MCH: 31.1 pg (ref 26.0–34.0)
MCHC: 33.8 g/dL (ref 30.0–36.0)
MCV: 91.9 fL (ref 80.0–100.0)
Platelets: 223 10*3/uL (ref 150–400)
RBC: 4.57 MIL/uL (ref 4.22–5.81)
RDW: 13.9 % (ref 11.5–15.5)
WBC: 8.8 10*3/uL (ref 4.0–10.5)
nRBC: 0 % (ref 0.0–0.2)

## 2018-08-23 MED ORDER — RANOLAZINE ER 500 MG PO TB12: 500.0000 mg | ORAL_TABLET | Freq: Two times a day (BID) | ORAL | 3 refills | Status: DC

## 2018-08-23 NOTE — Patient Instructions (Addendum)
You have been referred to Dr. Cruzita Lederer (Endocrinology) for Diabetes management.   Labs today We will only contact you if something comes back abnormal or we need to make some changes. Otherwise no news is good news!  Your physician recommends that you schedule a follow-up appointment in: 3 months with Dr. Aundra Dubin   Do the following things EVERYDAY: 1) Weigh yourself in the morning before breakfast. Write it down and keep it in a log. 2) Take your medicines as prescribed 3) Eat low salt foods-Limit salt (sodium) to 2000 mg per day.  4) Stay as active as you can everyday 5) Limit all fluids for the day to less than 2 liters

## 2018-08-23 NOTE — Telephone Encounter (Signed)
Pt walked in to office, previously submitted paperwork to the [place] multiple times including pt dropping off clinical documentation for Praluent, however they have "lost it" each time and pt still has not been able to start on PCSK9i therapy. Will submit prior auth through patient's Part D insurance instead to bypass the [facility].

## 2018-08-24 NOTE — Progress Notes (Signed)
Patient ID: [name], male   DOB: Mar 31, 1946, 73 y.o.   MRN: [id number] PCP: Dr Harrington Challenger Cardiology: Dr. Aundra Dubin  73 y.o. with history of CAD s/p CABG and obesity s/p lap banding procedure presents for cardiology followup.  Given exertional dyspnea and chest tightness, he had LHC in 1/15.  This showed no change from prior.  Grafts were patent and there was severe stenosis in distal D1, not amenable to PCI.   In 2017, he developed increased exertional dyspnea.  Eventually had a TEE showing severe MR with prolapse of a portion of the anterior mitral valve leaflet.  Coronary angiography in 6/17 showed patent grafts and 90% stenosis of distal diagonal not amenable to intervention.  In 8/17, he had mitral valve repair.  Post-op diastolic CHF, Lasix started and increased.   He had a cath again in 9/18 showing stable coronary anatomy.    He developed atrial arrhythmias in 12/18.  Both fibrillation and flutter were seen.  Saw Dr. Rayann Heman, recommended DCCV with amiodarone or sotalol if fibrillation recurs.  He had DCCV in 1/19.   In 6/19, he was noted to have developed AKI. Creatinine went up to 3.46.  Renal US was done, showing normal kidneys. Cause of AKI was not clear.  We stopped his Lasix. He saw nephrology and eventually went back on Lasix, currently taking 60 mg daily.  He is not using any NSAIDs.    He stopped pravastatin due to myalgias. We tried to get him on a PCSK9-inhibitor through the [place] but apparently he has to take Zetia first.    At last appointment, he reported that several days prior he had had a 30 minute episode of substernal chest heaviness associated with nausea and shoulder pain.  Symptoms resolved after her took NTG x 3.  There was no trigger to the symptoms.  He did not go to the ER.  We checked a BMET that day and creatinine was up to 4.15. Troponin was negative.   I had him do a Lexiscan Cardiolite in 11/19, which showed old inferior MI with no ischemia.  Echo in 11/19 showed EF  55-60%, s/p MV repair with mean gradient 6 mmHg.     He returns today for followup of CAD and CHF.  He has had no further chest pain.  Creatinine has remained significantly elevated, following closely with Dr. Jimmy Footman.  He is short of breath walking up hills and inclines.  He is short of breath walking long distances on flat ground.  No orthopnea/PND.  No palpitations. He had AV fistula constructed recently.   ECG (personally reviewed): NSR, PAC, IVCD 118 msec  Labs (11/12): LDL 70 Labs (5/13): K 4.1, creatinine 1.1, BNP 48 Labs (12/13): K 3.4, creatinine 1.07, LDL 59, HDL 37 Labs (1/15): K 4.3, creatinine 1.2, HCT 40.3 Labs (3/15): LDL 38, HDL 81, BNP 55, K 4, creatinine 1.2 Labs (6/15): K 4.2, creatinine 1.4, LFTs normal, LDL 66, HDL 35 Labs (8/16): K 4, creatinine 1.6, BNP 64 Labs (12/16): LDL 61, HDL 37 Labs (4/17): K 4.2, creatinine 1.35, HCT 38.7, BNP 80 Labs (8/17): K 4.2, creatinine 1.4 => 1.66, BNP 159 Labs (12/17): K 3.6, creatinine 1.75 Labs (2/18): LDL 76, HDL 36 Labs (9/18): K 3.9, creatinine 1.76 Labs (1/19): K 4.9, creatinine 1.76, hgb 14 Labs (6/19): K 3.6, creatinine 3.46 Labs (7/19): K 4.1, creatinine 3.66, hgb 12.7 Labs (10/19): LDL 177, hgb 13.4, troponin negative, K 4, creatinine 4.15  PMH: 1. OSA: on CPAP.  2. Diabetes mellitus 3. Allergic rhinitis 4. CAD: s/p CABG 1995.  LHC (10/11) with 90% distal D1 (patent proximal D1 stent), total occlusion CFX, total occlusion RCA, no significant stenoses in LAD, SVG-PDA patent, SVG-OM patent, EF 55%.  Medical management.  LHC (5/13) with patent grafts and 90% distal D1 stenosis (no significant change from 10/11).  LHC (1/15) with patent grafts, 75% ISR in D1 stent, 90% stenosis distal D1 (small caliber). D1 not amenable to intervention.  - Coronary angiography (6/17): grafts patent, 90% stenosis distally in large diagonal not amenable to intervention.   - LHC (9/18): SVG-OM and SVG-PDA patent,  75% in-stent restenosis in  proximal diagonal then 90% stenosis distally (small in caliber at that point), totally occluded LCx and RCA.  Left coronary system similar to prior caths, D1 may be source of angina.  - Lexiscan Cardiolite (11/19): EF 47%, old inferior MI, no ischemia.  5. Obesity: lap-banding in 5/11.  6. Chronic diastolic CHF: Echo (2/35) with EF 55-60%, moderate LVH, moderate diastolic dysfunction, s/p mitral valve repair with elevated mean gradient but normal pressure half-time.  - Echo (8/18): EF 55-60%, s/p MV repair with mean gradient 6 mmHg, PASP 28 mmHg, mildly decreased RV systolic function.  - Echo (11/19): EF 55-60%, s/p MV repair with mean gradient 6, PASP 42 mmHg, mildly dilated RV, dilated IVC.  7. TIA: Carotid dopplers in 11/11 with 0-39% bilateral stenosis.  Carotid dopplers 6/17 with 1-39% RICA stenosis.  8. OA: Knees, C-spine. TKR 9/13.  9. HTN 10. Hyperlipidemia: Myalgias with atorvastatin and Crestor.  11. Diverticulosis 12. MItral regurgitation: TEE (6/17) with EF 55-60%, severe eccentric MR, prolapse of a portion of the anterior leaflet, peak RV-RA gradient 45 mmHg.  - Mitral valve repair 8/17 13. CKD: Stage III.  14. Atrial fibrillation/atrial flutter: Both arrhythmias noted in 12/18.  DCCV 12/19.   15. AKI 6/19 => CKD stage 4: uncertain etiology.  Renal US was normal.   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  Lives in [place].   Family History  Problem Relation Age of Onset  . Other Mother        joint problems  . Hypertension Father   . Heart disease Father   . CVA Father   . Depression Father   . Heart disease Sister        CABG  . Stroke Sister   . Hypertension Sister   . Congestive Heart Failure Sister   . Diabetes Sister   . Heart disease Brother   . Hypertension Brother   . Congestive Heart Failure Brother     ROS: All systems reviewed and negative except as per HPI.    Current Outpatient Medications  Medication Sig Dispense Refill  .  amoxicillin (AMOXIL) 500 MG tablet Take 4 tablets (2 g) by mouth 30-60 minutes prior to dental appointment (Patient taking differently: Take 2,000 mg by mouth See admin instructions. Take 4 tablets (2 g) by mouth 30-60 minutes prior to dental appointment) 12 tablet 0  . apixaban (ELIQUIS) 5 MG TABS tablet Take 1 tablet (5 mg total) by mouth 2 (two) times daily. 180 tablet 3  . Artificial Saliva (MOUTH KOTE MT) Use as directed 4 sprays in the mouth or throat every 3 (three) hours as needed (for dry mouth).    . calcitRIOL (ROCALTROL) 0.25 MCG capsule Take 0.25 mcg by mouth daily.    . carvedilol (COREG) 3.125 MG tablet Take 3.125 mg by mouth 2 (two) times daily.    Marland Kitchen  cetirizine (ZYRTEC) 10 MG tablet Take 10 mg by mouth daily.     Marland Kitchen ezetimibe (ZETIA) 10 MG tablet Take 10 mg by mouth daily.    . furosemide (LASIX) 80 MG tablet Take 80 mg by mouth daily.     Marland Kitchen HYDROcodone-acetaminophen (NORCO/VICODIN) 5-325 MG tablet Take 0.5 tablets by mouth every 6 (six) hours as needed for moderate pain.    . Hypromellose (ARTIFICIAL TEARS OP) Apply 1 drop to eye 2 (two) times daily as needed (dry eyes).    . isosorbide mononitrate (IMDUR) 30 MG 24 hr tablet Take 1 tablet (30 mg total) by mouth daily. (Patient taking differently: Take 30 mg by mouth every evening. ) 90 tablet 3  . potassium chloride SA (K-DUR,KLOR-CON) 20 MEQ tablet Take 20 mEq by mouth daily.    . ranolazine (RANEXA) 500 MG 12 hr tablet Take 1 tablet (500 mg total) by mouth 2 (two) times daily. 180 tablet 3  . testosterone cypionate (DEPOTESTOSTERONE CYPIONATE) 200 MG/ML injection Inject 200 mg into the muscle once a week.     . traZODone (DESYREL) 100 MG tablet Take 100 mg by mouth at bedtime.    . fluticasone (FLONASE) 50 MCG/ACT nasal spray Place 1 spray into both nostrils daily as needed for allergies or rhinitis.    Marland Kitchen nitroGLYCERIN (NITROSTAT) 0.4 MG SL tablet PLACE 1 TABLET UNDER THE TONGUE EVERY 5 MINUTES AS NEEDED FOR CHEST PAIN UP TO 3  DOSES, IF SYMPTOMS PERSIST CALL 911 (Patient not taking: No sig reported) 25 tablet 3  . tadalafil (CIALIS) 20 MG tablet Take 20 mg by mouth daily as needed for erectile dysfunction.     No current facility-administered medications for this encounter.     BP 132/74   Pulse 71   Wt 97.5 kg (215 lb)   SpO2 95%   BMI 30.85 kg/m  General: NAD Neck: No JVD, no thyromegaly or thyroid nodule.  Lungs: Clear to auscultation bilaterally with normal respiratory effort. CV: Nondisplaced PMI.  Heart regular S1/S2, no S3/S4, no murmur.  No peripheral edema.  No carotid bruit.  Normal pedal pulses.  Abdomen: Soft, nontender, no hepatosplenomegaly, no distention.  Skin: Intact without lesions or rashes.  Neurologic: Alert and oriented x 3.  Psych: Normal affect. Extremities: No clubbing or cyanosis.  HEENT: Normal.   Assessment/Plan: 1. S/p mitral valve repair:  Echo 11/19 showed stable mitral valve repair compared to prior echo with no MR, mean gradient 6 mmHg.   - With prosthetic material, should use antibiotic prophylaxis with dental work.  2. CAD:  He has severe stenosis in a distal D1 that is not amenable to intervention.  This has been stable on last couple of caths. He has occluded RCA and LCx known from the past with SVGs to OM and PDA.  EF remains preserved by echo. Cardiolite in 11/19 showed no ischemia. No further chest pain.  - No ASA with apixaban use.  - Continue Coreg, ranolazine, and Imdur.  If he goes on HD, should probably stop ranolazine.  QTc acceptable. - He cannot tolerate statins with myalgias.    3. Hyperlipidemia: Myalgias with Crestor, pravastatin, and atorvastatin.  LDL 177.  He is having a hard time getting PCSK9-inhibitor through the [place] though he is an ideal candidate for it.  Will see if we can get it approved through our lipid clinic.  4. Chronic diastolic CHF:  With renal dysfunction, volume retention has been worsening.  Most recent echo showed higher PA pressure  and  dilated IVC.  NYHA class II-III symptoms. He is not volume overloaded on exam today.  - Lasix 80 mg daily.  With CKD stage IV, Dr. Jimmy Footman has been adjusting.    5. OSA: Continue CPAP.  6. Atrial fibrillation: Paroxysmal, in NSR after DCCV in 1/19.  If recurs, may need amiodarone to maintain NSR.  - Continue apixaban 5 mg bid. CBC today.  - Ranolazine will likely decrease risk of recurrence.  7. CKD stage 4: AKI without recovery, uncertain etiology.  He sees nephrology, there is concern that he is progressing towards HD. He now has AV fistula.  - Check BMET today.  8. Diabetes: He is concerned because control is worsening off metformin (stopped because of renal dysfunction).  - I will refer to endocrinology.   Followup 3 mos  [name] 08/24/2018

## 2018-08-25 ENCOUNTER — Telehealth (HOSPITAL_COMMUNITY): Payer: Self-pay

## 2018-08-25 DIAGNOSIS — M5032 Other cervical disc degeneration, mid-cervical region, unspecified level: Secondary | ICD-10-CM | POA: Diagnosis not present

## 2018-08-25 DIAGNOSIS — M9901 Segmental and somatic dysfunction of cervical region: Secondary | ICD-10-CM | POA: Diagnosis not present

## 2018-08-25 DIAGNOSIS — M5386 Other specified dorsopathies, lumbar region: Secondary | ICD-10-CM | POA: Diagnosis not present

## 2018-08-25 DIAGNOSIS — M9903 Segmental and somatic dysfunction of lumbar region: Secondary | ICD-10-CM | POA: Diagnosis not present

## 2018-08-25 DIAGNOSIS — M5414 Radiculopathy, thoracic region: Secondary | ICD-10-CM | POA: Diagnosis not present

## 2018-08-25 DIAGNOSIS — M9902 Segmental and somatic dysfunction of thoracic region: Secondary | ICD-10-CM | POA: Diagnosis not present

## 2018-08-25 MED ORDER — ALIROCUMAB 150 MG/ML ~~LOC~~ SOAJ: 1.0000 "pen " | SUBCUTANEOUS | 11 refills | Status: DC

## 2018-08-25 NOTE — Telephone Encounter (Signed)
Praluent prior authorization has been approved. Rx sent to pharmacy to determine copay.

## 2018-08-25 NOTE — Telephone Encounter (Signed)
Pt called and given lab results

## 2018-08-26 DIAGNOSIS — R3129 Other microscopic hematuria: Secondary | ICD-10-CM | POA: Diagnosis not present

## 2018-08-26 DIAGNOSIS — N5201 Erectile dysfunction due to arterial insufficiency: Secondary | ICD-10-CM | POA: Diagnosis not present

## 2018-08-26 NOTE — Telephone Encounter (Signed)
Praluent copay incredibly cost prohibitive at $750 per month. Will submit Repatha copay to see if this is any cheaper.

## 2018-08-30 DIAGNOSIS — M5032 Other cervical disc degeneration, mid-cervical region, unspecified level: Secondary | ICD-10-CM | POA: Diagnosis not present

## 2018-08-30 DIAGNOSIS — M9901 Segmental and somatic dysfunction of cervical region: Secondary | ICD-10-CM | POA: Diagnosis not present

## 2018-08-30 DIAGNOSIS — M9903 Segmental and somatic dysfunction of lumbar region: Secondary | ICD-10-CM | POA: Diagnosis not present

## 2018-08-30 DIAGNOSIS — M5414 Radiculopathy, thoracic region: Secondary | ICD-10-CM | POA: Diagnosis not present

## 2018-08-30 DIAGNOSIS — M9902 Segmental and somatic dysfunction of thoracic region: Secondary | ICD-10-CM | POA: Diagnosis not present

## 2018-08-30 DIAGNOSIS — M5386 Other specified dorsopathies, lumbar region: Secondary | ICD-10-CM | POA: Diagnosis not present

## 2018-08-30 NOTE — Telephone Encounter (Signed)
Unfortunately his plan denied coverage of Repatha. I have written a letter of medical necessity for Praluent and have faxed this to the [place] along with chart notes and documentation. Pt is aware to call us if he has not heard from the [place] in 1-2 weeks with their decision.

## 2018-08-30 NOTE — Telephone Encounter (Signed)
Ok, check on Repatha through [place] too. Thanks.

## 2018-08-30 NOTE — Telephone Encounter (Addendum)
Repatha prior authorization denied and Praluent cost prohibitive through Part D insurance at $750 per month. Will try a 4th time to send paperwork to the [place] to see if they will cover Praluent. They have "lost" previous paperwork despite pt dropping it off in person and handing it directly to his physician as well as our office faxing over clinical documentation multiple times. This will be our 4th attempt since June 2019 to ask the [employer] to cover Praluent for pt. LDL is now even higher than it was back in June, currently at 177 far above goal < 70 due to history of CAD.

## 2018-09-02 DIAGNOSIS — M5414 Radiculopathy, thoracic region: Secondary | ICD-10-CM | POA: Diagnosis not present

## 2018-09-02 DIAGNOSIS — M9903 Segmental and somatic dysfunction of lumbar region: Secondary | ICD-10-CM | POA: Diagnosis not present

## 2018-09-02 DIAGNOSIS — M5386 Other specified dorsopathies, lumbar region: Secondary | ICD-10-CM | POA: Diagnosis not present

## 2018-09-02 DIAGNOSIS — M5032 Other cervical disc degeneration, mid-cervical region, unspecified level: Secondary | ICD-10-CM | POA: Diagnosis not present

## 2018-09-02 DIAGNOSIS — M9902 Segmental and somatic dysfunction of thoracic region: Secondary | ICD-10-CM | POA: Diagnosis not present

## 2018-09-02 DIAGNOSIS — M9901 Segmental and somatic dysfunction of cervical region: Secondary | ICD-10-CM | POA: Diagnosis not present

## 2018-09-13 ENCOUNTER — Other Ambulatory Visit: Payer: Self-pay

## 2018-09-13 DIAGNOSIS — N184 Chronic kidney disease, stage 4 (severe): Secondary | ICD-10-CM

## 2018-09-13 NOTE — Progress Notes (Signed)
POST OPERATIVE OFFICE NOTE    CC:  F/u for surgery  HPI:  This is a 73 y.o. male who is s/p left RC AVF by Dr. Scot Dock on 08/02/18.  He presents today for follow up.  He states he is not on dialysis yet.  He denies any pain or numbness in his hand, but if he sleeps on his left arm, it "goes to sleep".  His nephrologist is Dr. Jimmy Footman.   Allergies  Allergen Reactions  . Crestor [Rosuvastatin] Other (See Comments)    Muscle aches   . Lipitor [Atorvastatin] Other (See Comments)    Muscle aches  . Pravastatin Other (See Comments)    Muscle aches  . Codeine Itching and Other (See Comments)    Extremity tingling-- can take synthetic    Current Outpatient Medications  Medication Sig Dispense Refill  . Alirocumab (PRALUENT) 150 MG/ML SOAJ Inject 1 pen into the skin every 14 (fourteen) days. 2 pen 11  . amoxicillin (AMOXIL) 500 MG tablet Take 4 tablets (2 g) by mouth 30-60 minutes prior to dental appointment (Patient taking differently: Take 2,000 mg by mouth See admin instructions. Take 4 tablets (2 g) by mouth 30-60 minutes prior to dental appointment) 12 tablet 0  . apixaban (ELIQUIS) 5 MG TABS tablet Take 1 tablet (5 mg total) by mouth 2 (two) times daily. 180 tablet 3  . Artificial Saliva (MOUTH KOTE MT) Use as directed 4 sprays in the mouth or throat every 3 (three) hours as needed (for dry mouth).    . calcitRIOL (ROCALTROL) 0.25 MCG capsule Take 0.25 mcg by mouth daily.    . carvedilol (COREG) 3.125 MG tablet Take 3.125 mg by mouth 2 (two) times daily.    . cetirizine (ZYRTEC) 10 MG tablet Take 10 mg by mouth daily.     Marland Kitchen ezetimibe (ZETIA) 10 MG tablet Take 10 mg by mouth daily.    . fluticasone (FLONASE) 50 MCG/ACT nasal spray Place 1 spray into both nostrils daily as needed for allergies or rhinitis.    . furosemide (LASIX) 80 MG tablet Take 80 mg by mouth daily.     Marland Kitchen HYDROcodone-acetaminophen (NORCO/VICODIN) 5-325 MG tablet Take 0.5 tablets by mouth every 6 (six) hours as  needed for moderate pain.    . Hypromellose (ARTIFICIAL TEARS OP) Apply 1 drop to eye 2 (two) times daily as needed (dry eyes).    . isosorbide mononitrate (IMDUR) 30 MG 24 hr tablet Take 1 tablet (30 mg total) by mouth daily. (Patient taking differently: Take 30 mg by mouth every evening. ) 90 tablet 3  . nitroGLYCERIN (NITROSTAT) 0.4 MG SL tablet PLACE 1 TABLET UNDER THE TONGUE EVERY 5 MINUTES AS NEEDED FOR CHEST PAIN UP TO 3 DOSES, IF SYMPTOMS PERSIST CALL 911 (Patient not taking: No sig reported) 25 tablet 3  . potassium chloride SA (K-DUR,KLOR-CON) 20 MEQ tablet Take 20 mEq by mouth daily.    . ranolazine (RANEXA) 500 MG 12 hr tablet Take 1 tablet (500 mg total) by mouth 2 (two) times daily. 180 tablet 3  . tadalafil (CIALIS) 20 MG tablet Take 20 mg by mouth daily as needed for erectile dysfunction.    Marland Kitchen testosterone cypionate (DEPOTESTOSTERONE CYPIONATE) 200 MG/ML injection Inject 200 mg into the muscle once a week.     . traZODone (DESYREL) 100 MG tablet Take 100 mg by mouth at bedtime.     No current facility-administered medications for this visit.      ROS:  See HPI  Physical Exam:  Today's Vitals   09/14/18 1527  BP: 106/66  Pulse: 79  Resp: 20  Temp: 97.8 F (36.6 C)  SpO2: 96%  Weight: 215 lb (97.5 kg)  Height: 5\' 10"  (1.778 m)   Body mass index is 30.85 kg/m.   Incision:  Healed nicely Extremities:  Easily palpable left radial pulse; the graft is easily palpable and has an excellent thrill    Dialysis duplex 09/14/2018: Depth:  0.25cm-0.45cm Diameter:  0.47cm-0.69cm   Assessment/Plan:  This is a 73 y.o. male who is s/p: Left RC AVF by Dr. Scot Dock on 08/02/18  -pt fistula is maturing nicely and is easily palpable with excellent thrill.  He has a palpable left radial pulse.  He does not have any evidence of steal sx.   -his fistula will be ready to use November 01, 2018  -discussed with he and his wife that this may require maintenance in the future and they  understand this -he will f/u as needed   Leontine Locket, PA-C [facility] [phone]  Clinic MD:  Oneida Alar

## 2018-09-14 ENCOUNTER — Ambulatory Visit (INDEPENDENT_AMBULATORY_CARE_PROVIDER_SITE_OTHER): Payer: Self-pay | Admitting: Physician Assistant

## 2018-09-14 ENCOUNTER — Other Ambulatory Visit: Payer: Self-pay

## 2018-09-14 ENCOUNTER — Ambulatory Visit (HOSPITAL_COMMUNITY)
Admission: RE | Admit: 2018-09-14 | Discharge: 2018-09-14 | Disposition: A | Discharge status: Home / Self Care | Payer: Medicare Other | Source: Ambulatory Visit | Attending: Family | Admitting: Family

## 2018-09-14 VITALS — BP 106/66 | HR 79 | Temp 97.8°F | Resp 20 | Ht 70.0 in | Wt 215.0 lb

## 2018-09-14 DIAGNOSIS — N184 Chronic kidney disease, stage 4 (severe): Secondary | ICD-10-CM

## 2018-09-15 DIAGNOSIS — E1122 Type 2 diabetes mellitus with diabetic chronic kidney disease: Secondary | ICD-10-CM | POA: Diagnosis not present

## 2018-09-15 DIAGNOSIS — R972 Elevated prostate specific antigen [PSA]: Secondary | ICD-10-CM | POA: Diagnosis not present

## 2018-09-15 DIAGNOSIS — I2510 Atherosclerotic heart disease of native coronary artery without angina pectoris: Secondary | ICD-10-CM | POA: Diagnosis not present

## 2018-09-15 DIAGNOSIS — N183 Chronic kidney disease, stage 3 (moderate): Secondary | ICD-10-CM | POA: Diagnosis not present

## 2018-09-15 DIAGNOSIS — I10 Essential (primary) hypertension: Secondary | ICD-10-CM | POA: Diagnosis not present

## 2018-09-15 DIAGNOSIS — E7800 Pure hypercholesterolemia, unspecified: Secondary | ICD-10-CM | POA: Diagnosis not present

## 2018-09-15 DIAGNOSIS — Z125 Encounter for screening for malignant neoplasm of prostate: Secondary | ICD-10-CM | POA: Diagnosis not present

## 2018-09-15 DIAGNOSIS — E291 Testicular hypofunction: Secondary | ICD-10-CM | POA: Diagnosis not present

## 2018-09-15 DIAGNOSIS — E559 Vitamin D deficiency, unspecified: Secondary | ICD-10-CM | POA: Diagnosis not present

## 2018-09-19 DIAGNOSIS — N184 Chronic kidney disease, stage 4 (severe): Secondary | ICD-10-CM | POA: Diagnosis not present

## 2018-09-19 DIAGNOSIS — R972 Elevated prostate specific antigen [PSA]: Secondary | ICD-10-CM | POA: Diagnosis not present

## 2018-09-19 DIAGNOSIS — E1122 Type 2 diabetes mellitus with diabetic chronic kidney disease: Secondary | ICD-10-CM | POA: Diagnosis not present

## 2018-09-19 DIAGNOSIS — E291 Testicular hypofunction: Secondary | ICD-10-CM | POA: Diagnosis not present

## 2018-09-19 DIAGNOSIS — E559 Vitamin D deficiency, unspecified: Secondary | ICD-10-CM | POA: Diagnosis not present

## 2018-09-19 DIAGNOSIS — Z0000 Encounter for general adult medical examination without abnormal findings: Secondary | ICD-10-CM | POA: Diagnosis not present

## 2018-09-19 DIAGNOSIS — I10 Essential (primary) hypertension: Secondary | ICD-10-CM | POA: Diagnosis not present

## 2018-09-19 DIAGNOSIS — I509 Heart failure, unspecified: Secondary | ICD-10-CM | POA: Diagnosis not present

## 2018-09-20 ENCOUNTER — Ambulatory Visit: Payer: Medicare Other | Admitting: Internal Medicine

## 2018-10-04 DIAGNOSIS — N2581 Secondary hyperparathyroidism of renal origin: Secondary | ICD-10-CM | POA: Diagnosis not present

## 2018-10-04 DIAGNOSIS — N185 Chronic kidney disease, stage 5: Secondary | ICD-10-CM | POA: Diagnosis not present

## 2018-10-04 DIAGNOSIS — I120 Hypertensive chronic kidney disease with stage 5 chronic kidney disease or end stage renal disease: Secondary | ICD-10-CM | POA: Diagnosis not present

## 2018-10-04 DIAGNOSIS — R339 Retention of urine, unspecified: Secondary | ICD-10-CM | POA: Diagnosis not present

## 2018-10-04 DIAGNOSIS — Z9889 Other specified postprocedural states: Secondary | ICD-10-CM | POA: Diagnosis not present

## 2018-10-04 DIAGNOSIS — I770 Arteriovenous fistula, acquired: Secondary | ICD-10-CM | POA: Diagnosis not present

## 2018-10-04 DIAGNOSIS — I509 Heart failure, unspecified: Secondary | ICD-10-CM | POA: Diagnosis not present

## 2018-10-04 DIAGNOSIS — E1129 Type 2 diabetes mellitus with other diabetic kidney complication: Secondary | ICD-10-CM | POA: Diagnosis not present

## 2018-10-04 DIAGNOSIS — I2510 Atherosclerotic heart disease of native coronary artery without angina pectoris: Secondary | ICD-10-CM | POA: Diagnosis not present

## 2018-10-04 DIAGNOSIS — Z79899 Other long term (current) drug therapy: Secondary | ICD-10-CM | POA: Diagnosis not present

## 2018-10-04 DIAGNOSIS — R319 Hematuria, unspecified: Secondary | ICD-10-CM | POA: Diagnosis not present

## 2018-10-04 DIAGNOSIS — D631 Anemia in chronic kidney disease: Secondary | ICD-10-CM | POA: Diagnosis not present

## 2018-10-04 DIAGNOSIS — E1122 Type 2 diabetes mellitus with diabetic chronic kidney disease: Secondary | ICD-10-CM | POA: Diagnosis not present

## 2018-10-18 DIAGNOSIS — R0989 Other specified symptoms and signs involving the circulatory and respiratory systems: Secondary | ICD-10-CM | POA: Diagnosis not present

## 2018-10-18 DIAGNOSIS — R05 Cough: Secondary | ICD-10-CM | POA: Diagnosis not present

## 2018-10-18 DIAGNOSIS — J0110 Acute frontal sinusitis, unspecified: Secondary | ICD-10-CM | POA: Diagnosis not present

## 2018-10-21 ENCOUNTER — Other Ambulatory Visit: Payer: Self-pay

## 2018-10-21 ENCOUNTER — Ambulatory Visit (INDEPENDENT_AMBULATORY_CARE_PROVIDER_SITE_OTHER): Payer: Medicare Other | Admitting: Internal Medicine

## 2018-10-21 ENCOUNTER — Encounter: Payer: Self-pay | Admitting: Internal Medicine

## 2018-10-21 VITALS — BP 130/80 | HR 74 | Ht 70.0 in | Wt 221.0 lb

## 2018-10-21 DIAGNOSIS — E1165 Type 2 diabetes mellitus with hyperglycemia: Secondary | ICD-10-CM

## 2018-10-21 DIAGNOSIS — E1159 Type 2 diabetes mellitus with other circulatory complications: Secondary | ICD-10-CM | POA: Diagnosis not present

## 2018-10-21 MED ORDER — SEMAGLUTIDE(0.25 OR 0.5MG/DOS) 2 MG/1.5ML ~~LOC~~ SOPN: 0.5000 mg | PEN_INJECTOR | SUBCUTANEOUS | 5 refills | Status: DC

## 2018-10-21 NOTE — Progress Notes (Signed)
Faxed per request.

## 2018-10-21 NOTE — Progress Notes (Signed)
Patient ID: [name], male   DOB: 1946/05/20, 73 y.o.   MRN: [id number]   HPI: [name] is a 73 y.o.-year-old male, referred by his nephrologist, Dr. Jimmy Footman, for management of DM2 2/2 [place], dx in 1990s, non-insulin-dependent, uncontrolled, with long-term complications (CAD- s/p CABG 1990s, CHF, A. fib, CKD stage V, cerebrovascular disease, s/p TIA, ED).  Reviewed latest HbA1c level: 10/04/2018: HbA1c 8.2% Lab Results  Component Value Date   HGBA1C 7.2 (H) 03/23/2016   HGBA1C 6.5 (H) 08/20/2012   He is currently not on any diabetes medications after being taken off metformin due to worsening CKD 03/2018.  Pt checks his sugars only if he feels poorly >> 80s-200 -recently  - am: n/c - 2h after b'fast: n/c - before lunch: n/c - 2h after lunch: n/c - before dinner: n/c - 2h after dinner: n/c - bedtime: n/c - nighttime: n/c Lowest sugar was 80s (not recently); he has hypoglycemia awareness at 70.  Highest sugar was 200.  Glucometer: ?  Pt's meals are: - Breakfast: pineapple juice! Or rarely egg and bacon (not lately) - Brunch: meat + veggies - Dinner: same as lunch - Snacks: crackers, popcorn - 1 bag a night, "junk food"  - + Stage V CKD, seeing nephrology, had fistula placed >> no need for HD now; last BUN/creatinine:  10/04/2018: 32/3.78, GFR 15, glucose 184 Lab Results  Component Value Date   BUN 39 (H) 08/23/2018   BUN 32 (H) 06/17/2018   CREATININE 3.88 (H) 08/23/2018   CREATININE 4.15 (H) 06/17/2018  Not on ACE inhibitor/ARB.  -+ HL; last set of lipids: Lab Results  Component Value Date   CHOL 245 (H) 06/17/2018   HDL 35 (L) 06/17/2018   LDLCALC 177 (H) 06/17/2018   TRIG 166 (H) 06/17/2018   CHOLHDL 7.0 06/17/2018  He was taken off pravastatin due to generalized aches and pains.  He is currently on Praluent every 2 weeks - recently started, and stopped Zetia 10 mg daily.  - last eye exam was in 2019. No DR reportedly. Dr. Katy Fitch.  - no  numbness and tingling in his feet.  Pt has FH of DM in sister and brother.  He has a history of lap band surgery 2010. Lost from 373 lbs to 215-220 lbs.  Also, h/o HTN.  ROS: Constitutional: + weight gain, + weight loss, + fatigue, no subjective hyperthermia, + subjective hypothermia, no nocturia Eyes: no blurry vision, no xerophthalmia ENT: no sore throat, no nodules palpated in neck, no dysphagia, no odynophagia, no hoarseness, no tinnitus, + hypoacusis Cardiovascular: + all: CP, SOB, palpitations, leg swelling Respiratory: no cough, + SOB, no wheezing Gastrointestinal: no N, no V, no D, + C, no acid reflux Musculoskeletal: + muscle, + joint aches Skin: no rash, no hair loss, + easy bruising Neurological: no tremors, no numbness or tingling/no dizziness/no HAs Psychiatric: no depression, no anxiety + diff with erections  Past Medical History:  Diagnosis Date  . Allergic rhinitis   . Anxiety   . Atrial fibrillation ([place])   . Cancer ([place])    hx of skin cancers   . Chronic kidney disease   . Coronary artery disease    a. s/p MI & CABG; b. s/p PCI Diag;  c. Cath 12/2011: LAD diff dzs/small, Diag 75% isr, 90 dist to stent (small), LCX & RCA occluded, VG->OM 40, VG->PDA patent - med rx.  . Depression    PTSD  . Diabetes mellitus    not  on medications  . Diastolic CHF, chronic (HCC)    a. EF 55-60% by echo 2007  . Dysrhythmia    "abnormal beat"  . GERD (gastroesophageal reflux disease)    "not anymore" " I had a lap band"  . History of diverticulitis of colon    5 YRS AGO  . History of pneumonia    2017  . History of transient ischemic attack (TIA)    CAROTID DOPPLERS NOV 2011  0-39& BIL. STENOSIS  . Hyperlipidemia   . Hypertension   . Mitral regurgitation   . Myocardial infarction ([place])   . Neuromuscular disorder ([place])    left arm numbness   . 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 PTSD (post-traumatic stress disorder)     from [place]  . 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 Shortness of breath dyspnea    with exertion  . Sleep apnea    cpap- see ov note in [facility] 05/12/11 for settings    Past Surgical History:  Procedure Laterality Date  . AV FISTULA PLACEMENT Left 08/02/2018   Procedure: ARTERIOVENOUS (AV) FISTULA CREATION RADIOCEPHALIC;  Surgeon: Angelia Mould, MD;  Location: [facility];  Service: Vascular;  Laterality: Left;  . BLEPHAROPLASTY  1985   BILATERAL  . CARDIAC CATHETERIZATION  2001, 2004, 2009, 2011  . CARDIAC CATHETERIZATION N/A 01/23/2016   Procedure: Right/Left Heart Cath and Coronary Angiography;  Surgeon: [name], MD;  Location: [facility] CV LAB;  Service: Cardiovascular;  Laterality: N/A;  . CARDIOVERSION N/A 09/07/2017   Procedure: CARDIOVERSION;  Surgeon: [name], MD;  Location[facility] ENDOSCOPY;  Service: Cardiovascular;  Laterality: N/A;  . CATARACT EXTRACTION W/ INTRAOCULAR LENS IMPLANT Bilateral   . CHONDROPLASTY  07/22/2011   Procedure: CHONDROPLASTY;  Surgeon: Dione Plover Aluisio;  Location: [facility];  Service: Orthopedics;;  . CIRCUMCISION  35 YRS  AGO  . COLONOSCOPY    . CORONARY ANGIOPLASTY  1996-- POST CABG   W/ STENT, last cath 01/07/2012   . CORONARY ARTERY BYPASS GRAFT  1995   X3 VESSEL  . CORONARY STENT PLACEMENT    . KNEE ARTHROSCOPY  07/22/2011   Procedure: ARTHROSCOPY KNEE;  Surgeon: [name];  Location: [facility];  Service: Orthopedics;  Laterality: Right;  WITH DEBRIDEMENT   . KNEE ARTHROSCOPY W/ MENISCECTOMY  X2 IN 2002-- RIGHT KNEE  . LAPAROSCOPIC GASTRIC BANDING  01-15-09  . LEFT HEART CATH AND CORONARY ANGIOGRAPHY N/A 04/29/2017   Procedure: LEFT HEART CATH AND CORONARY ANGIOGRAPHY;  Surgeon: [name], MD;  Location: [place] CV LAB;  Service: Cardiovascular;  Laterality: N/A;  . LEFT HEART CATHETERIZATION WITH CORONARY ANGIOGRAM N/A 09/11/2013   Procedure: LEFT HEART CATHETERIZATION WITH CORONARY ANGIOGRAM;  Surgeon: [name], MD;  Location: [facility] CATH LAB;  Service: Cardiovascular;  Laterality: N/A;  . MITRAL VALVE REPAIR Right 03/25/2016   Procedure: MINIMALLY INVASIVE REOPERATION FOR MITRAL VALVE REPAIR  (MVR) with size 26 Sorin Memo 3D;  Surgeon: [name], MD;  Location: [facility];  Service: Open Heart Surgery;  Laterality: Right;  . RIGHT KNEE ED COMPARTMENT REPLACEMENT  2004  . SYNOVECTOMY  07/22/2011   Procedure: SYNOVECTOMY;  Surgeon: [name];  Location: [facility];  Service: Orthopedics;;  . TEE WITHOUT CARDIOVERSION N/A 01/23/2016   Procedure: TRANSESOPHAGEAL ECHOCARDIOGRAM (TEE);  Surgeon: [name], MD;  Location: [facility];  Service: Cardiovascular;  Laterality: N/A;  . TEE WITHOUT CARDIOVERSION N/A 03/25/2016   Procedure: TRANSESOPHAGEAL ECHOCARDIOGRAM (TEE);  Surgeon: [name], MD;  Location: [facility];  Service: Open Heart Surgery;  Laterality: N/A;  . UMBILICAL HERNIA REPAIR  01-15-09   W/ GASTRIC BANDING PROCEDURE   Social History   Socioeconomic History  . Marital status: Married    Spouse name: Not on file  . Number of children: 1  . Years of education: Not on file  . Highest education level: Not on file  Occupational History  . Contractor, semi-retired  Social Needs  . Financial resource strain: Not on file  . Food insecurity:    Worry: Not on file    Inability: Not on file  . Transportation needs:    Medical: Not on file    Non-medical: Not on file  Tobacco Use  . Smoking status: Never Smoker  . Smokeless tobacco: Never Used  Substance and Sexual Activity  . Alcohol use: Yes, liquor    Comment: OCCASIONAL  . Drug use: No  . Sexual activity: Not on file  Lifestyle  . Physical activity:    Days per  week: Not on file    Minutes per session: Not on file  . Stress: Not on file  Relationships  . Social connections:    Talks on phone: Not on file    Gets together: Not on file    Attends religious service: Not on file    Active member of club or organization: Not on file    Attends meetings of clubs or organizations: Not on file    Relationship status: Not on file  . Intimate partner violence:    Fear of current or ex partner: Not on file    Emotionally abused: Not on file    Physically abused: Not on file    Forced sexual activity: Not on file  Other Topics Concern  . Not on file  Social History Narrative  . Not on file   Current Outpatient Medications on File Prior to Visit  Medication Sig Dispense Refill  . Alirocumab (PRALUENT) 150 MG/ML SOAJ Inject 1 pen into the skin every 14 (fourteen) days. 2 pen 11  . apixaban (ELIQUIS) 5 MG TABS tablet Take 1 tablet (5 mg total) by mouth 2 (two) times daily. 180 tablet 3  . Artificial Saliva (MOUTH KOTE MT) Use as directed 4 sprays in the mouth or throat every 3 (three) hours as needed (for dry mouth).    . calcitRIOL (ROCALTROL) 0.25 MCG capsule Take 0.25 mcg by mouth daily.    . carvedilol (COREG) 3.125 MG tablet Take 3.125 mg by mouth 2 (two) times daily.    . cetirizine (ZYRTEC) 10 MG tablet Take 10 mg by mouth daily.     Marland Kitchen ezetimibe (ZETIA) 10 MG tablet Take 10 mg by mouth daily.    . fluticasone (FLONASE) 50 MCG/ACT nasal spray Place 1 spray into both nostrils daily as needed for allergies or rhinitis.    . furosemide (LASIX) 80 MG tablet Take 80 mg by mouth daily.     Marland Kitchen HYDROcodone-acetaminophen (NORCO/VICODIN) 5-325 MG tablet Take 0.5 tablets by mouth every 6 (six) hours as needed for moderate pain.    . Hypromellose (ARTIFICIAL TEARS OP) Apply 1 drop to eye 2 (two) times daily as needed (dry  eyes).    . nitroGLYCERIN (NITROSTAT) 0.4 MG SL tablet PLACE 1 TABLET UNDER THE TONGUE EVERY 5 MINUTES AS NEEDED FOR CHEST PAIN UP TO 3  DOSES, IF SYMPTOMS PERSIST CALL 911 25 tablet 3  . potassium chloride SA (K-DUR,KLOR-CON) 20 MEQ tablet Take 20 mEq by mouth daily.    . ranolazine (RANEXA) 500 MG 12 hr tablet Take 1 tablet (500 mg total) by mouth 2 (two) times daily. 180 tablet 3  . tadalafil (CIALIS) 20 MG tablet Take 20 mg by mouth daily as needed for erectile dysfunction.    Marland Kitchen testosterone cypionate (DEPOTESTOSTERONE CYPIONATE) 200 MG/ML injection Inject 200 mg into the muscle once a week.     . traZODone (DESYREL) 100 MG tablet Take 100 mg by mouth at bedtime.    Marland Kitchen amoxicillin (AMOXIL) 500 MG tablet Take 4 tablets (2 g) by mouth 30-60 minutes prior to dental appointment (Patient not taking: Reported on 10/21/2018) 12 tablet 0  . isosorbide mononitrate (IMDUR) 30 MG 24 hr tablet Take 1 tablet (30 mg total) by mouth daily. (Patient taking differently: Take 30 mg by mouth every evening. ) 90 tablet 3   No current facility-administered medications on file prior to visit.    Allergies  Allergen Reactions  . Crestor [Rosuvastatin] Other (See Comments)    Muscle aches   . Lipitor [Atorvastatin] Other (See Comments)    Muscle aches  . Pravastatin Other (See Comments)    Muscle aches  . Codeine Itching and Other (See Comments)    Extremity tingling-- can take synthetic   Family History  Problem Relation Age of Onset  . Other Mother        joint problems  . Hypertension Father   . Heart disease Father   . CVA Father   . Depression Father   . Heart disease Sister        CABG  . Stroke Sister   . Hypertension Sister   . Congestive Heart Failure Sister   . Diabetes Sister   . Heart disease Brother   . Hypertension Brother   . Congestive Heart Failure Brother     PE: BP 130/80   Pulse 74   Ht 5\' 10"  (1.778 m)   Wt 221 lb (100.2 kg)   SpO2 99%   BMI 31.71 kg/m  Wt Readings from Last 3 Encounters:  10/21/18 221 lb (100.2 kg)  09/14/18 215 lb (97.5 kg)  08/23/18 215 lb (97.5 kg)   Constitutional:  overweight, in NAD Eyes: PERRLA, EOMI, no exophthalmos ENT: moist mucous membranes, no thyromegaly, no cervical lymphadenopathy Cardiovascular: RRR, No MRG Respiratory: CTA B Gastrointestinal: abdomen soft, NT, ND, BS+ Musculoskeletal: no deformities, strength intact in all 4 Skin: moist, warm, no rashes Neurological: no tremor with outstretched hands, DTR normal in all 4  ASSESSMENT: 1. DM2, non-insulin-dependent, uncontrolled, with long-term complications - CAD, s/p coronaroplasty, then CABG 1995 - cardiologist - Dr. Aundra Dubin - CHF, EF 75 to 65% - Atrial fibrillation, cardioversion in 2019 - Carotid artery disease - Cerebrovascular disease, s/p TIA - CKD stage V, pending dialysis -Dr. Jimmy Footman - ED  PLAN:  1. Patient with long-standing, uncontrolled diabetes, off metformin due to worsening kidney function.  Is currently not on any diabetic medication.  Most recent HbA1c was 8.2%. - At this visit, he is not checking sugars >> strongly advised him to start checking once a day - we discussed that he will need to be on medication to control his diabetes, which is a  factor in worsening kidney function and heart disease.  We discussed about starting insulin which will lower his sugars, however, he does not have cardiovascular benefits.  My bias would be to try a weekly GLP-1 receptor agonist.  He agrees to try this.  We discussed about benefits and possible side effects.  We will start at a low dose and advance as tolerated. We cannot use an SGLT 2 inhibitor due to his poor kidney function. He will likely get this from the [place]. - more importantly, I advised him to stop pineapple juice which is one of the sweetest juices.  - I also offered a referral to nutrition >> he will think about this - I suggested to:  Patient Instructions  Please stop juice!  Please start Ozempic 0.25 mg weekly in a.m. (for example on Sunday morning) x 4 weeks, then increase to 0.5 mg weekly in a.m. if no nausea or  hypoglycemia.  Please let me know if the sugars are consistently <80 or >200.  Please come back for a follow-up appointment in 3 months.  - Strongly advised him to start checking sugars at different times of the day - check 1x a day, rotating checks - discussed about CBG targets for treatment: 80-130 mg/dL before meals and <180 mg/dL after meals; target HbA1c <7%. - given sugar log and advised how to fill it and to bring it at next appt  - given foot care handout and explained the principles  - given instructions for hypoglycemia management "15-15 rule"  - advised for yearly eye exams  - Return to clinic in 3 mo with sugar log   Philemon Kingdom, MD PhD [facility] Endocrinology

## 2018-10-21 NOTE — Patient Instructions (Addendum)
Please stop juice!  Please start Ozempic 0.25 mg weekly in a.m. (for example on Sunday morning) x 4 weeks, then increase to 0.5 mg weekly in a.m. if no nausea or hypoglycemia.  Please let me know if the sugars are consistently <80 or >200.  Please come back for a follow-up appointment in 3 months.  PATIENT INSTRUCTIONS FOR TYPE 2 DIABETES:  **Please join MyChart!** - see attached instructions about how to join if you have not done so already.  DIET AND EXERCISE Diet and exercise is an important part of diabetic treatment.  We recommended aerobic exercise in the form of brisk walking (working between 40-60% of maximal aerobic capacity, similar to brisk walking) for 150 minutes per week (such as 30 minutes five days per week) along with 3 times per week performing 'resistance' training (using various gauge rubber tubes with handles) 5-10 exercises involving the major muscle groups (upper body, lower body and core) performing 10-15 repetitions (or near fatigue) each exercise. Start at half the above goal but build slowly to reach the above goals. If limited by weight, joint pain, or disability, we recommend daily walking in a swimming pool with water up to waist to reduce pressure from joints while allow for adequate exercise.    BLOOD GLUCOSES Monitoring your blood glucoses is important for continued management of your diabetes. Please check your blood glucoses 2-4 times a day: fasting, before meals and at bedtime (you can rotate these measurements - e.g. one day check before the 3 meals, the next day check before 2 of the meals and before bedtime, etc.).   HYPOGLYCEMIA (low blood sugar) Hypoglycemia is usually a reaction to not eating, exercising, or taking too much insulin/ other diabetes drugs.  Symptoms include tremors, sweating, hunger, confusion, headache, etc. Treat IMMEDIATELY with 15 grams of Carbs: . 4 glucose tablets .  cup regular juice/soda . 2 tablespoons raisins . 4 teaspoons  sugar . 1 tablespoon honey Recheck blood glucose in 15 mins and repeat above if still symptomatic/blood glucose <100.  RECOMMENDATIONS TO REDUCE YOUR RISK OF DIABETIC COMPLICATIONS: * Take your prescribed MEDICATION(S) * Follow a DIABETIC diet: Complex carbs, fiber rich foods, (monounsaturated and polyunsaturated) fats * AVOID saturated/trans fats, high fat foods, >2,300 mg salt per day. * EXERCISE at least 5 times a week for 30 minutes or preferably daily.  * DO NOT SMOKE OR DRINK more than 1 drink a day. * Check your FEET every day. Do not wear tightfitting shoes. Contact us if you develop an ulcer * See your EYE doctor once a year or more if needed * Get a FLU shot once a year * Get a PNEUMONIA vaccine once before and once after age 20 years  GOALS:  * Your Hemoglobin A1c of <7%  * fasting sugars need to be <130 * after meals sugars need to be <180 (2h after you start eating) * Your Systolic BP should be 093 or lower  * Your Diastolic BP should be 80 or lower  * Your HDL (Good Cholesterol) should be 40 or higher  * Your LDL (Bad Cholesterol) should be 100 or lower. * Your Triglycerides should be 150 or lower  * Your Urine microalbumin (kidney function) should be <30 * Your Body Mass Index should be 25 or lower    Please consider the following ways to cut down carbs and fat and increase fiber and micronutrients in your diet: - substitute whole grain for white bread or pasta - substitute brown rice  for white rice - substitute 90-calorie flat bread pieces for slices of bread when possible - substitute sweet potatoes or yams for white potatoes - substitute humus for margarine - substitute tofu for cheese when possible - substitute almond or rice milk for regular milk (would not drink soy milk daily due to concern for soy estrogen influence on breast cancer risk) - substitute dark chocolate for other sweets when possible - substitute water - can add lemon or orange slices for taste  - for diet sodas (artificial sweeteners will trick your body that you can eat sweets without getting calories and will lead you to overeating and weight gain in the long run) - do not skip breakfast or other meals (this will slow down the metabolism and will result in more weight gain over time)  - can try smoothies made from fruit and almond/rice milk in am instead of regular breakfast - can also try old-fashioned (not instant) oatmeal made with almond/rice milk in am - order the dressing on the side when eating salad at a restaurant (pour less than half of the dressing on the salad) - eat as little meat as possible - can try juicing, but should not forget that juicing will get rid of the fiber, so would alternate with eating raw veg./fruits or drinking smoothies - use as little oil as possible, even when using olive oil - can dress a salad with a mix of balsamic vinegar and lemon juice, for e.g. - use agave nectar, stevia sugar, or regular sugar rather than artificial sweateners - steam or broil/roast veggies  - snack on veggies/fruit/nuts (unsalted, preferably) when possible, rather than processed foods - reduce or eliminate aspartame in diet (it is in diet sodas, chewing gum, etc) Read the labels!  Try to read Dr. Janene Harvey book: "Program for Reversing Diabetes" for other ideas for healthy eating.

## 2018-10-31 ENCOUNTER — Telehealth: Payer: Self-pay | Admitting: Internal Medicine

## 2018-10-31 NOTE — Telephone Encounter (Signed)
Patient's wife [name] ph# [phone] called re: patient cannot afford Ozempic. Trying to get Ozempic through the [place] but patients needs medication in the meantime. Please call [name] at above ph# to advise.

## 2018-11-01 NOTE — Telephone Encounter (Signed)
Let's give him 1-2 pens until then, if we have.

## 2018-11-01 NOTE — Telephone Encounter (Signed)
Called patient's wife and let her know we have samples ready for them to pick up.

## 2018-11-02 ENCOUNTER — Ambulatory Visit: Payer: Medicare Other | Admitting: Internal Medicine

## 2018-11-11 ENCOUNTER — Telehealth (HOSPITAL_COMMUNITY): Payer: Self-pay

## 2018-11-11 NOTE — Telephone Encounter (Signed)
Called patient this morning to reschedule due to [facility] Virus risk.  Pt made aware of need to reschedule to protect him and staff. Pt amenable to reschedule. Pt have no current complaints at present.  Pt encouraged to call office if any concerns arise at home.  Pt aware office still open for phone triage.  Pt has no needs right now. Pt rescheduled and appt card mailed.

## 2018-11-14 ENCOUNTER — Encounter: Payer: Self-pay | Admitting: Internal Medicine

## 2018-11-14 ENCOUNTER — Telehealth: Payer: Self-pay

## 2018-11-14 NOTE — Telephone Encounter (Signed)
Patient is trying to get his [name] through the [place] however they sent him a letter stating Dr. Cruzita Lederer must provide a detailed letter stating what he has taken in the past, what he has failed, and that Ozempic is the drug of choice.   Patient's wife brought the letter in and it does not supply a fax number so we will have to mail to patient and have them submit it themselves.

## 2018-11-14 NOTE — Progress Notes (Signed)
Patient ID: [name], male   DOB: 1945/12/28, 73 y.o.   MRN: [id number]   HPI: [name] is a 73 y.o.-year-old male, referred by his nephrologist, Dr. Jimmy Footman, for management of DM2 2/2 [place], dx in 1990s, non-insulin-dependent, uncontrolled, with long-term complications (CAD- s/p CABG 1990s, CHF, A. fib, CKD stage V, cerebrovascular disease, s/p TIA, ED).  Reviewed latest HbA1c level: 10/04/2018: HbA1c 8.2% Lab Results  Component Value Date   HGBA1C 7.2 (H) 03/23/2016   HGBA1C 6.5 (H) 08/20/2012   He is currently not on any diabetes medications after being taken off metformin due to worsening CKD 03/2018.  Pt checks his sugars only if he feels poorly >> 80s-200 -recently  - am: n/c - 2h after b'fast: n/c - before lunch: n/c - 2h after lunch: n/c - before dinner: n/c - 2h after dinner: n/c - bedtime: n/c - nighttime: n/c Lowest sugar was 80s (not recently); he has hypoglycemia awareness at 70.  Highest sugar was 200.  Glucometer: ?  Pt's meals are: - Breakfast: pineapple juice! Or rarely egg and bacon (not lately) - Brunch: meat + veggies - Dinner: same as lunch - Snacks: crackers, popcorn - 1 bag a night, "junk food"  - + Stage V CKD, seeing nephrology, had fistula placed >> no need for HD now; last BUN/creatinine:  10/04/2018: 32/3.78, GFR 15, glucose 184 Lab Results  Component Value Date   BUN 39 (H) 08/23/2018   BUN 32 (H) 06/17/2018   CREATININE 3.88 (H) 08/23/2018   CREATININE 4.15 (H) 06/17/2018  Not on ACE inhibitor/ARB.  -+ HL; last set of lipids: Lab Results  Component Value Date   CHOL 245 (H) 06/17/2018   HDL 35 (L) 06/17/2018   LDLCALC 177 (H) 06/17/2018   TRIG 166 (H) 06/17/2018   CHOLHDL 7.0 06/17/2018  He was taken off pravastatin due to generalized aches and pains.  He is currently on Praluent every 2 weeks - recently started, and stopped Zetia 10 mg daily.  - last eye exam was in 2019. No DR reportedly. Dr. Katy Fitch.  - no  numbness and tingling in his feet.  Pt has FH of DM in sister and brother.  He has a history of lap band surgery 2010. Lost from 373 lbs to 215-220 lbs.  Also, h/o HTN.  ROS: Constitutional: + weight gain, + weight loss, + fatigue, no subjective hyperthermia, + subjective hypothermia, no nocturia Eyes: no blurry vision, no xerophthalmia ENT: no sore throat, no nodules palpated in neck, no dysphagia, no odynophagia, no hoarseness, no tinnitus, + hypoacusis Cardiovascular: + all: CP, SOB, palpitations, leg swelling Respiratory: no cough, + SOB, no wheezing Gastrointestinal: no N, no V, no D, + C, no acid reflux Musculoskeletal: + muscle, + joint aches Skin: no rash, no hair loss, + easy bruising Neurological: no tremors, no numbness or tingling/no dizziness/no HAs Psychiatric: no depression, no anxiety + diff with erections  Past Medical History:  Diagnosis Date  . Allergic rhinitis   . Anxiety   . Atrial fibrillation ([place])   . Cancer ([place])    hx of skin cancers   . Chronic kidney disease   . Coronary artery disease    a. s/p MI & CABG; b. s/p PCI Diag;  c. Cath 12/2011: LAD diff dzs/small, Diag 75% isr, 90 dist to stent (small), LCX & RCA occluded, VG->OM 40, VG->PDA patent - med rx.  . Depression    PTSD  . Diabetes mellitus    not  on medications  . Diastolic CHF, chronic (HCC)    a. EF 55-60% by echo 2007  . Dysrhythmia    "abnormal beat"  . GERD (gastroesophageal reflux disease)    "not anymore" " I had a lap band"  . History of diverticulitis of colon    5 YRS AGO  . History of pneumonia    2017  . History of transient ischemic attack (TIA)    CAROTID DOPPLERS NOV 2011  0-39& BIL. STENOSIS  . Hyperlipidemia   . Hypertension   . Mitral regurgitation   . Myocardial infarction ([place])   . Neuromuscular disorder ([place])    left arm numbness   . 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 PTSD (post-traumatic stress disorder)     from [place]  . 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 Shortness of breath dyspnea    with exertion  . Sleep apnea    cpap- see ov note in [facility] 05/12/11 for settings    Past Surgical History:  Procedure Laterality Date  . AV FISTULA PLACEMENT Left 08/02/2018   Procedure: ARTERIOVENOUS (AV) FISTULA CREATION RADIOCEPHALIC;  Surgeon: Angelia Mould, MD;  Location: [place];  Service: Vascular;  Laterality: Left;  . BLEPHAROPLASTY  1985   BILATERAL  . CARDIAC CATHETERIZATION  2001, 2004, 2009, 2011  . CARDIAC CATHETERIZATION N/A 01/23/2016   Procedure: Right/Left Heart Cath and Coronary Angiography;  Surgeon: [name], MD;  Location: [place] CV LAB;  Service: Cardiovascular;  Laterality: N/A;  . CARDIOVERSION N/A 09/07/2017   Procedure: CARDIOVERSION;  Surgeon: [name], MD;  Location: [facility] ENDOSCOPY;  Service: Cardiovascular;  Laterality: N/A;  . CATARACT EXTRACTION W/ INTRAOCULAR LENS IMPLANT Bilateral   . CHONDROPLASTY  07/22/2011   Procedure: CHONDROPLASTY;  Surgeon: Dione Plover Aluisio;  Location: [place];  Service: Orthopedics;;  . CIRCUMCISION  35 YRS  AGO  . COLONOSCOPY    . CORONARY ANGIOPLASTY  1996-- POST CABG   W/ STENT, last cath 01/07/2012   . CORONARY ARTERY BYPASS GRAFT  1995   X3 VESSEL  . CORONARY STENT PLACEMENT    . KNEE ARTHROSCOPY  07/22/2011   Procedure: ARTHROSCOPY KNEE;  Surgeon: [name];  Location: [place];  Service: Orthopedics;  Laterality: Right;  WITH DEBRIDEMENT   . KNEE ARTHROSCOPY W/ MENISCECTOMY  X2 IN 2002-- RIGHT KNEE  . LAPAROSCOPIC GASTRIC BANDING  01-15-09  . LEFT HEART CATH AND CORONARY ANGIOGRAPHY N/A 04/29/2017   Procedure: LEFT HEART CATH AND CORONARY ANGIOGRAPHY;  Surgeon: [name], MD;  Location: [place] CV LAB;  Service: Cardiovascular;  Laterality: N/A;  . LEFT HEART CATHETERIZATION WITH CORONARY ANGIOGRAM N/A 09/11/2013   Procedure: LEFT HEART CATHETERIZATION WITH CORONARY ANGIOGRAM;  Surgeon: [name], MD;  Location: [facility] CATH LAB;  Service: Cardiovascular;  Laterality: N/A;  . MITRAL VALVE REPAIR Right 03/25/2016   Procedure: MINIMALLY INVASIVE REOPERATION FOR MITRAL VALVE REPAIR  (MVR) with size 26 Sorin Memo 3D;  Surgeon: [name], MD;  Location: [place];  Service: Open Heart Surgery;  Laterality: Right;  . RIGHT KNEE ED COMPARTMENT REPLACEMENT  2004  . SYNOVECTOMY  07/22/2011   Procedure: SYNOVECTOMY;  Surgeon: [name];  Location: [place];  Service: Orthopedics;;  . TEE WITHOUT CARDIOVERSION N/A 01/23/2016   Procedure: TRANSESOPHAGEAL ECHOCARDIOGRAM (TEE);  Surgeon: [name], MD;  Location: [place];  Service: Cardiovascular;  Laterality: N/A;  . TEE WITHOUT CARDIOVERSION N/A 03/25/2016   Procedure: TRANSESOPHAGEAL ECHOCARDIOGRAM (TEE);  Surgeon: [name], MD;  Location: [place];  Service: Open Heart Surgery;  Laterality: N/A;  . UMBILICAL HERNIA REPAIR  01-15-09   W/ GASTRIC BANDING PROCEDURE   Social History   Socioeconomic History  . Marital status: Married    Spouse name: Not on file  . Number of children: 1  . Years of education: Not on file  . Highest education level: Not on file  Occupational History  . Contractor, semi-retired  Social Needs  . Financial resource strain: Not on file  . Food insecurity:    Worry: Not on file    Inability: Not on file  . Transportation needs:    Medical: Not on file    Non-medical: Not on file  Tobacco Use  . Smoking status: Never Smoker  . Smokeless tobacco: Never Used  Substance and Sexual Activity  . Alcohol use: Yes, liquor    Comment: OCCASIONAL  . Drug use: No  . Sexual activity: Not on file  Lifestyle  . Physical activity:    Days per  week: Not on file    Minutes per session: Not on file  . Stress: Not on file  Relationships  . Social connections:    Talks on phone: Not on file    Gets together: Not on file    Attends religious service: Not on file    Active member of club or organization: Not on file    Attends meetings of clubs or organizations: Not on file    Relationship status: Not on file  . Intimate partner violence:    Fear of current or ex partner: Not on file    Emotionally abused: Not on file    Physically abused: Not on file    Forced sexual activity: Not on file  Other Topics Concern  . Not on file  Social History Narrative  . Not on file   Current Outpatient Medications on File Prior to Visit  Medication Sig Dispense Refill  . Alirocumab (PRALUENT) 150 MG/ML SOAJ Inject 1 pen into the skin every 14 (fourteen) days. 2 pen 11  . amoxicillin (AMOXIL) 500 MG tablet Take 4 tablets (2 g) by mouth 30-60 minutes prior to dental appointment (Patient not taking: Reported on 10/21/2018) 12 tablet 0  . apixaban (ELIQUIS) 5 MG TABS tablet Take 1 tablet (5 mg total) by mouth 2 (two) times daily. 180 tablet 3  . Artificial Saliva (MOUTH KOTE MT) Use as directed 4 sprays in the mouth or throat every 3 (three) hours as needed (for dry mouth).    . calcitRIOL (ROCALTROL) 0.25 MCG capsule Take 0.25 mcg by mouth daily.    . carvedilol (COREG) 3.125 MG tablet Take 3.125 mg by mouth 2 (two) times daily.    . cetirizine (ZYRTEC) 10 MG tablet Take 10 mg by mouth daily.     . fluticasone (FLONASE) 50 MCG/ACT nasal spray Place 1 spray into both nostrils daily as needed for allergies or rhinitis.    . furosemide (LASIX) 80 MG tablet Take 80 mg by mouth daily.     Marland Kitchen HYDROcodone-acetaminophen (NORCO/VICODIN) 5-325 MG tablet Take 0.5 tablets by mouth every 6 (six) hours as needed for moderate pain.    . Hypromellose (ARTIFICIAL  TEARS OP) Apply 1 drop to eye 2 (two) times daily as needed (dry eyes).    . isosorbide mononitrate  (IMDUR) 30 MG 24 hr tablet Take 1 tablet (30 mg total) by mouth daily. (Patient taking differently: Take 30 mg by mouth every evening. ) 90 tablet 3  . nitroGLYCERIN (NITROSTAT) 0.4 MG SL tablet PLACE 1 TABLET UNDER THE TONGUE EVERY 5 MINUTES AS NEEDED FOR CHEST PAIN UP TO 3 DOSES, IF SYMPTOMS PERSIST CALL 911 25 tablet 3  . potassium chloride SA (K-DUR,KLOR-CON) 20 MEQ tablet Take 20 mEq by mouth daily.    . ranolazine (RANEXA) 500 MG 12 hr tablet Take 1 tablet (500 mg total) by mouth 2 (two) times daily. 180 tablet 3  . Semaglutide,0.25 or 0.5MG /DOS, (OZEMPIC, 0.25 OR 0.5 MG/DOSE,) 2 MG/1.5ML SOPN Inject 0.5 mg into the skin once a week. 2 pen 5  . tadalafil (CIALIS) 20 MG tablet Take 20 mg by mouth daily as needed for erectile dysfunction.    Marland Kitchen testosterone cypionate (DEPOTESTOSTERONE CYPIONATE) 200 MG/ML injection Inject 200 mg into the muscle once a week.     . traZODone (DESYREL) 100 MG tablet Take 100 mg by mouth at bedtime.     No current facility-administered medications on file prior to visit.    Allergies  Allergen Reactions  . Crestor [Rosuvastatin] Other (See Comments)    Muscle aches   . Lipitor [Atorvastatin] Other (See Comments)    Muscle aches  . Pravastatin Other (See Comments)    Muscle aches  . Codeine Itching and Other (See Comments)    Extremity tingling-- can take synthetic   Family History  Problem Relation Age of Onset  . Other Mother        joint problems  . Hypertension Father   . Heart disease Father   . CVA Father   . Depression Father   . Heart disease Sister        CABG  . Stroke Sister   . Hypertension Sister   . Congestive Heart Failure Sister   . Diabetes Sister   . Heart disease Brother   . Hypertension Brother   . Congestive Heart Failure Brother     PE: There were no vitals taken for this visit. Wt Readings from Last 3 Encounters:  10/21/18 221 lb (100.2 kg)  09/14/18 215 lb (97.5 kg)  08/23/18 215 lb (97.5 kg)   Constitutional:  overweight, in NAD Eyes: PERRLA, EOMI, no exophthalmos ENT: moist mucous membranes, no thyromegaly, no cervical lymphadenopathy Cardiovascular: RRR, No MRG Respiratory: CTA B Gastrointestinal: abdomen soft, NT, ND, BS+ Musculoskeletal: no deformities, strength intact in all 4 Skin: moist, warm, no rashes Neurological: no tremor with outstretched hands, DTR normal in all 4  ASSESSMENT: 1. DM2, non-insulin-dependent, uncontrolled, with long-term complications - CAD, s/p coronaroplasty, then CABG 1995 - cardiologist - Dr. Aundra Dubin - CHF, EF 29 to 65% - Atrial fibrillation, cardioversion in 2019 - Carotid artery disease - Cerebrovascular disease, s/p TIA - CKD stage V, pending dialysis -Dr. Jimmy Footman - ED  PLAN:  1. Patient with long-standing, uncontrolled diabetes, off metformin due to worsening kidney function.  Is currently not on any diabetic medication.  Most recent HbA1c was 8.2%. - At this visit, he is not checking sugars >> strongly advised him to start checking once a day - we discussed that he will need to be on medication to control his diabetes, which is a factor in worsening kidney function and heart disease.  We discussed about  starting insulin which will lower his sugars, however, he does not have cardiovascular benefits.  My bias would be to try a weekly GLP-1 receptor agonist.  He agrees to try this.  We discussed about benefits and possible side effects.  We will start at a low dose and advance as tolerated. We cannot use an SGLT 2 inhibitor due to his poor kidney function. He will likely get this from the [place]. - more importantly, I advised him to stop pineapple juice which is one of the sweetest juices.  - I also offered a referral to nutrition >> he will think about this - I suggested to:  Patient Instructions  Please stop juice!  Please start Ozempic 0.25 mg weekly in a.m. (for example on Sunday morning) x 4 weeks, then increase to 0.5 mg weekly in a.m. if no nausea or  hypoglycemia.  Please let me know if the sugars are consistently <80 or >200.  Please come back for a follow-up appointment in 3 months.  - Strongly advised him to start checking sugars at different times of the day - check 1x a day, rotating checks - discussed about CBG targets for treatment: 80-130 mg/dL before meals and <180 mg/dL after meals; target HbA1c <7%. - given sugar log and advised how to fill it and to bring it at next appt  - given foot care handout and explained the principles  - given instructions for hypoglycemia management "15-15 rule"  - advised for yearly eye exams  - Return to clinic in 3 mo with sugar log   Philemon Kingdom, MD PhD [facility]

## 2018-11-14 NOTE — Telephone Encounter (Signed)
done

## 2018-11-15 NOTE — Telephone Encounter (Signed)
Spoke to patient and he would like letter mailed to him and he will take it to the [place].  Letter mailed.

## 2018-11-16 ENCOUNTER — Encounter (HOSPITAL_COMMUNITY): Payer: Medicare Other | Admitting: Cardiology

## 2018-12-01 DIAGNOSIS — L814 Other melanin hyperpigmentation: Secondary | ICD-10-CM | POA: Diagnosis not present

## 2018-12-01 DIAGNOSIS — L720 Epidermal cyst: Secondary | ICD-10-CM | POA: Diagnosis not present

## 2018-12-01 DIAGNOSIS — L570 Actinic keratosis: Secondary | ICD-10-CM | POA: Diagnosis not present

## 2018-12-01 DIAGNOSIS — L821 Other seborrheic keratosis: Secondary | ICD-10-CM | POA: Diagnosis not present

## 2018-12-01 DIAGNOSIS — D225 Melanocytic nevi of trunk: Secondary | ICD-10-CM | POA: Diagnosis not present

## 2018-12-01 DIAGNOSIS — Z85828 Personal history of other malignant neoplasm of skin: Secondary | ICD-10-CM | POA: Diagnosis not present

## 2018-12-06 DIAGNOSIS — N2581 Secondary hyperparathyroidism of renal origin: Secondary | ICD-10-CM | POA: Diagnosis not present

## 2018-12-06 DIAGNOSIS — I2510 Atherosclerotic heart disease of native coronary artery without angina pectoris: Secondary | ICD-10-CM | POA: Diagnosis not present

## 2018-12-06 DIAGNOSIS — I509 Heart failure, unspecified: Secondary | ICD-10-CM | POA: Diagnosis not present

## 2018-12-06 DIAGNOSIS — I4891 Unspecified atrial fibrillation: Secondary | ICD-10-CM | POA: Diagnosis not present

## 2018-12-06 DIAGNOSIS — D631 Anemia in chronic kidney disease: Secondary | ICD-10-CM | POA: Diagnosis not present

## 2018-12-06 DIAGNOSIS — N185 Chronic kidney disease, stage 5: Secondary | ICD-10-CM | POA: Diagnosis not present

## 2018-12-06 DIAGNOSIS — Z9889 Other specified postprocedural states: Secondary | ICD-10-CM | POA: Diagnosis not present

## 2018-12-06 DIAGNOSIS — Z79899 Other long term (current) drug therapy: Secondary | ICD-10-CM | POA: Diagnosis not present

## 2018-12-06 DIAGNOSIS — I120 Hypertensive chronic kidney disease with stage 5 chronic kidney disease or end stage renal disease: Secondary | ICD-10-CM | POA: Diagnosis not present

## 2018-12-06 DIAGNOSIS — E1122 Type 2 diabetes mellitus with diabetic chronic kidney disease: Secondary | ICD-10-CM | POA: Diagnosis not present

## 2018-12-06 DIAGNOSIS — I770 Arteriovenous fistula, acquired: Secondary | ICD-10-CM | POA: Diagnosis not present

## 2018-12-06 DIAGNOSIS — N189 Chronic kidney disease, unspecified: Secondary | ICD-10-CM | POA: Diagnosis not present

## 2018-12-06 DIAGNOSIS — R319 Hematuria, unspecified: Secondary | ICD-10-CM | POA: Diagnosis not present

## 2019-01-06 ENCOUNTER — Telehealth: Payer: Self-pay | Admitting: Internal Medicine

## 2019-01-06 NOTE — Telephone Encounter (Signed)
His GFR is 15! It is either Ozempic/Trulicity or insulin.  If they refuse the meds I prescribe, I will be forced to let them manage his Diabetes.Jason KitchenMarland [name]

## 2019-01-06 NOTE — Telephone Encounter (Signed)
Added this note to the other one.

## 2019-01-06 NOTE — Telephone Encounter (Signed)
Patient's wife [name] requests to be called at ph# [phone] re: Ozempic/[place]. Please call the above ph# to discuss/advise

## 2019-01-06 NOTE — Telephone Encounter (Signed)
Patients wife called stating she wanted to give this information to [facility]:  January 23, 20202 : A1C 8.3

## 2019-01-06 NOTE — Telephone Encounter (Signed)
Sunday Shams C 29 minutes ago (2:11 PM)      Patients wife called stating she wanted to give this information to [facility]:  January 23, 20202 : A1C 8.3

## 2019-01-06 NOTE — Telephone Encounter (Signed)
Spoke to patient's wife and she states that the [place] endocrine denied to cover ozempic because his A1c is not high enough and that they informed him there are plenty of pills he can be given.  They also will not cover trulicity

## 2019-01-09 ENCOUNTER — Encounter: Payer: Self-pay | Admitting: Internal Medicine

## 2019-01-09 NOTE — Telephone Encounter (Signed)
Patient's wife [name] called re: requesting to be called at ph# [phone] once Dr. Cruzita Lederer has responded to the earlier question about Ozempic.

## 2019-01-10 NOTE — Telephone Encounter (Signed)
I spoke to the patient and let him know of Dr. Arman Filter response, he let me know that he has a new [place] doctor that he discussed this with and she said if we send her most recent notes she can try to get it approved.  Patient will stop by clinic today to pick up paperwork that I have left at the front.

## 2019-01-10 NOTE — Telephone Encounter (Signed)
This is a duplicate phone note created in error please disregard and refer to call on 01/06/2019.

## 2019-01-11 ENCOUNTER — Encounter: Payer: Self-pay | Admitting: Internal Medicine

## 2019-01-25 DIAGNOSIS — M109 Gout, unspecified: Secondary | ICD-10-CM | POA: Diagnosis not present

## 2019-01-25 DIAGNOSIS — M79671 Pain in right foot: Secondary | ICD-10-CM | POA: Diagnosis not present

## 2019-01-25 DIAGNOSIS — M25471 Effusion, right ankle: Secondary | ICD-10-CM | POA: Diagnosis not present

## 2019-01-25 DIAGNOSIS — M25571 Pain in right ankle and joints of right foot: Secondary | ICD-10-CM | POA: Diagnosis not present

## 2019-01-25 DIAGNOSIS — R52 Pain, unspecified: Secondary | ICD-10-CM | POA: Diagnosis not present

## 2019-01-31 ENCOUNTER — Other Ambulatory Visit: Payer: Self-pay

## 2019-02-01 DIAGNOSIS — M1009 Idiopathic gout, multiple sites: Secondary | ICD-10-CM | POA: Diagnosis not present

## 2019-02-01 DIAGNOSIS — M79671 Pain in right foot: Secondary | ICD-10-CM | POA: Diagnosis not present

## 2019-02-02 ENCOUNTER — Other Ambulatory Visit: Payer: Self-pay

## 2019-02-02 ENCOUNTER — Ambulatory Visit (INDEPENDENT_AMBULATORY_CARE_PROVIDER_SITE_OTHER): Payer: Medicare Other | Admitting: Internal Medicine

## 2019-02-02 ENCOUNTER — Encounter: Payer: Self-pay | Admitting: Internal Medicine

## 2019-02-02 VITALS — BP 120/70 | HR 82 | Ht 70.0 in | Wt 205.0 lb

## 2019-02-02 DIAGNOSIS — E782 Mixed hyperlipidemia: Secondary | ICD-10-CM | POA: Diagnosis not present

## 2019-02-02 DIAGNOSIS — E1165 Type 2 diabetes mellitus with hyperglycemia: Secondary | ICD-10-CM

## 2019-02-02 DIAGNOSIS — E1159 Type 2 diabetes mellitus with other circulatory complications: Secondary | ICD-10-CM

## 2019-02-02 DIAGNOSIS — E669 Obesity, unspecified: Secondary | ICD-10-CM | POA: Diagnosis not present

## 2019-02-02 LAB — POCT GLYCOSYLATED HEMOGLOBIN (HGB A1C): Hemoglobin A1C: 8.5 % — AB (ref 4.0–5.6)

## 2019-02-02 MED ORDER — GLIPIZIDE 5 MG PO TABS: 5.0000 mg | ORAL_TABLET | Freq: Two times a day (BID) | ORAL | 3 refills | Status: DC

## 2019-02-02 NOTE — Progress Notes (Signed)
Patient ID: [name] [name], male   DOB: 1945-09-14, 73 y.o.   MRN: [id number]   HPI: [name] is a 73 y.o.-year-old male, initially referred by his nephrologist, Dr. 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 fib, CKD stage V, cerebrovascular disease, s/p TIA, ED).  Last visit 3.5 months ago.  Reviewed his HbA1c levels: 01/11/2019: HbA1c 8.3% 10/04/2018: HbA1c 8.2% Lab Results  Component Value Date   HGBA1C 7.2 (H) 03/23/2016   HGBA1C 6.5 (H) 08/20/2012   At last visit, he was not on any diabetes medications after being taken off metformin due to worsening CKD 03/2018.  At last visit, I advised him to start: - Ozempic 0.25 >> 0.5 mg weekly >> but unfortunately, he could not start [facility] as [facility] medications first before [facility] the GLP-1 receptor agonist since his HbA1c is "not high enough".  I did let them know that his GFR is 15 so the options are either insulin or the GLP-1 receptor agonist.  Pt checks his sugars 1x a day: - am: n/c >> 169-299 - 2h after b'fast: n/c - before lunch: n/c - 2h after lunch: n/c - before dinner: n/c - 2h after dinner: n/c - bedtime: n/c - nighttime: n/c Lowest sugar was 80s (not recently) >> 169; he has hypoglycemia awareness in the 70s. Highest sugar was 200 >> 299.  Glucometer: ?  Pt's meals are: - Breakfast: egg and bacon (not lately).  At last visit, he was drinking pineapple juice every morning and I strongly advised him to stop. - Brunch: meat + veggies - Dinner: same as lunch - Snacks: crackers, popcorn   -+ Stage V CKD, seeing nephrology, had fistula placed >> but no need for hemodialysis quite yet; last BUN/creatinine:  01/11/2019: 45//3.79, GFR 16, glucose 149, ACR 52.5 10/04/2018: 32/3.78, GFR 15, glucose 184 Lab Results  Component Value Date   BUN 39 (H) 08/23/2018   BUN 32 (H)  06/17/2018   CREATININE 3.88 (H) 08/23/2018   CREATININE 4.15 (H) 06/17/2018  No on an ACE inhibitor/ARB  -+ HL; last set of lipids: Lab Results  Component Value Date   CHOL 245 (H) 06/17/2018   HDL 35 (L) 06/17/2018   LDLCALC 177 (H) 06/17/2018   TRIG 166 (H) 06/17/2018   CHOLHDL 7.0 06/17/2018  He was on pravastatin but stopped due to generalized aches and pains. He also stopped Zetia.  Currently on Praluent every 2 weeks.    - last eye exam was in  2019: No DR reportedly. Dr. Katy Fitch.  - no numbness and tingling in his feet.  Pt has FH of DM in sister and brother.  He has a history of lap band surgery 2010. Lost from 373 lbs to 215-220 lbs.  He also has a history of HTN.  ROS: Constitutional: no weight gain/+ weight loss, no fatigue, no subjective hyperthermia, no subjective hypothermia Eyes: no blurry vision, no xerophthalmia ENT: no sore throat, no nodules palpated in neck, no dysphagia, no odynophagia, no hoarseness Cardiovascular: no CP/no SOB/no palpitations/no leg swelling Respiratory: no cough/no SOB/no wheezing Gastrointestinal: no N/no V/no D/no C/no acid reflux Musculoskeletal: no muscle aches/no joint aches Skin: no rashes, no hair loss Neurological: no tremors/no numbness/no tingling/no dizziness  I reviewed pt's medications, allergies, PMH, social hx, family hx, and changes were documented in the history of present illness. Otherwise, unchanged from my initial  visit note.  Past Medical History:  Diagnosis Date  . Allergic rhinitis   . Anxiety   . Atrial fibrillation ([place])   . Cancer ([place])    hx of skin cancers   . Chronic kidney disease   . Coronary artery disease    a. s/p MI & CABG; b. s/p PCI Diag;  c. Cath 12/2011: LAD diff dzs/small, Diag 75% isr, 90 dist to stent (small), LCX & RCA occluded, VG->OM 40, VG->PDA patent - med rx.  . Depression    PTSD  . Diabetes mellitus    not on medications  . Diastolic CHF, chronic (HCC)    a. EF 55-60% by echo  2007  . Dysrhythmia    "abnormal beat"  . GERD (gastroesophageal reflux disease)    "not anymore" " I had a lap band"  . History of diverticulitis of colon    5 YRS AGO  . History of pneumonia    2017  . History of transient ischemic attack (TIA)    CAROTID DOPPLERS NOV 2011  0-39& BIL. STENOSIS  . Hyperlipidemia   . Hypertension   . Mitral regurgitation   . Myocardial infarction ([place])   . Neuromuscular disorder ([place])    left arm numbness   . 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 PTSD (post-traumatic stress disorder)    from [place]  . 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 Shortness of breath dyspnea    with exertion  . Sleep apnea    cpap- see ov note in [facility] 05/12/11 for settings    Past Surgical History:  Procedure Laterality Date  . AV FISTULA PLACEMENT Left 08/02/2018   Procedure: ARTERIOVENOUS (AV) FISTULA CREATION RADIOCEPHALIC;  Surgeon: Angelia Mould, MD;  Location: [place];  Service: Vascular;  Laterality: Left;  . BLEPHAROPLASTY  1985   BILATERAL  . CARDIAC CATHETERIZATION  2001, 2004, 2009, 2011  . CARDIAC CATHETERIZATION N/A 01/23/2016   Procedure: Right/Left Heart Cath and Coronary Angiography;  Surgeon: [name], MD;  Location: [place] CV LAB;  Service: Cardiovascular;  Laterality: N/A;  . CARDIOVERSION N/A 09/07/2017   Procedure: CARDIOVERSION;  Surgeon: [name], MD;  Location: [facility] ENDOSCOPY;  Service: Cardiovascular;  Laterality: N/A;  . CATARACT EXTRACTION W/ INTRAOCULAR LENS IMPLANT Bilateral   . CHONDROPLASTY  07/22/2011   Procedure: CHONDROPLASTY;  Surgeon: Dione Plover Aluisio;  Location: [place];  Service: Orthopedics;;  . CIRCUMCISION  35 YRS  AGO  . COLONOSCOPY    .  CORONARY ANGIOPLASTY  1996-- POST CABG   W/ STENT, last cath 01/07/2012   . CORONARY ARTERY BYPASS GRAFT  1995   X3 VESSEL  . CORONARY STENT PLACEMENT    . KNEE ARTHROSCOPY  07/22/2011   Procedure: ARTHROSCOPY KNEE;  Surgeon: [name];  Location: [place];  Service: Orthopedics;  Laterality: Right;  WITH DEBRIDEMENT   . KNEE ARTHROSCOPY W/ MENISCECTOMY  X2 IN 2002-- RIGHT KNEE  . LAPAROSCOPIC GASTRIC BANDING  01-15-09  . LEFT HEART CATH AND CORONARY ANGIOGRAPHY N/A 04/29/2017   Procedure: LEFT HEART CATH AND CORONARY ANGIOGRAPHY;  Surgeon: [name], MD;  Location: [place] CV LAB;  Service: Cardiovascular;  Laterality: N/A;  . LEFT HEART CATHETERIZATION WITH CORONARY ANGIOGRAM  N/A 09/11/2013   Procedure: LEFT HEART CATHETERIZATION WITH CORONARY ANGIOGRAM;  Surgeon: [name], MD;  Location: [facility] CATH LAB;  Service: Cardiovascular;  Laterality: N/A;  . MITRAL VALVE REPAIR Right 03/25/2016   Procedure: MINIMALLY INVASIVE REOPERATION FOR MITRAL VALVE REPAIR  (MVR) with size 26 Sorin Memo 3D;  Surgeon: [name], MD;  Location: [place];  Service: Open Heart Surgery;  Laterality: Right;  . RIGHT KNEE ED COMPARTMENT REPLACEMENT  2004  . SYNOVECTOMY  07/22/2011   Procedure: SYNOVECTOMY;  Surgeon: [name];  Location: [place];  Service: Orthopedics;;  . TEE WITHOUT CARDIOVERSION N/A 01/23/2016   Procedure: TRANSESOPHAGEAL ECHOCARDIOGRAM (TEE);  Surgeon: [name], MD;  Location: [place];  Service: Cardiovascular;  Laterality: N/A;  . TEE WITHOUT CARDIOVERSION N/A 03/25/2016   Procedure: TRANSESOPHAGEAL ECHOCARDIOGRAM (TEE);  Surgeon: [name], MD;  Location: [employer] [facility];  Service: Open Heart Surgery;  Laterality: N/A;  . UMBILICAL HERNIA REPAIR  01-15-09   W/ GASTRIC BANDING PROCEDURE   Social History   Socioeconomic History  . Marital status: Married    Spouse name: Not on file  . Number of children: 1  . Years of  education: Not on file  . Highest education level: Not on file  Occupational History  . Contractor, semi-retired  Social Needs  . Financial resource strain: Not on file  . Food insecurity:    Worry: Not on file    Inability: Not on file  . Transportation needs:    Medical: Not on file    Non-medical: Not on file  Tobacco Use  . Smoking status: Never Smoker  . Smokeless tobacco: Never Used  Substance and Sexual Activity  . Alcohol use: Yes, liquor    Comment: OCCASIONAL  . Drug use: No  . Sexual activity: Not on file  Lifestyle  . Physical activity:    Days per week: Not on file    Minutes per session: Not on file  . Stress: Not on file  Relationships  . Social connections:    Talks on phone: Not on file    Gets together: Not on file    Attends religious service: Not on file    Active member of club or organization: Not on file    Attends meetings of clubs or organizations: Not on file    Relationship status: Not on file  . Intimate partner violence:    Fear of current or ex partner: Not on file    Emotionally abused: Not on file    Physically abused: Not on file    Forced sexual activity: Not on file  Other Topics Concern  . Not on file  Social History Narrative  . Not on file   Current Outpatient Medications on File Prior to Visit  Medication Sig Dispense Refill  . Alirocumab (PRALUENT) 150 MG/ML SOAJ Inject 1 pen into the skin every 14 (fourteen) days. 2 pen 11  . amoxicillin (AMOXIL) 500 MG tablet Take 4 tablets (2 g) by mouth 30-60 minutes prior to dental appointment (Patient not taking: Reported on 10/21/2018) 12 tablet 0  . apixaban (ELIQUIS) 5 MG TABS tablet Take 1 tablet (5 mg total) by mouth 2 (two) times daily. 180 tablet 3  . Artificial Saliva (MOUTH KOTE MT) Use as directed 4 sprays in the mouth or throat every 3 (three) hours as needed (for dry mouth).    . calcitRIOL (ROCALTROL) 0.25 MCG capsule Take 0.25 mcg by mouth daily.    Marland Kitchen  carvedilol (COREG) 3.125  MG tablet Take 3.125 mg by mouth 2 (two) times daily.    . cetirizine (ZYRTEC) 10 MG tablet Take 10 mg by mouth daily.     . fluticasone (FLONASE) 50 MCG/ACT nasal spray Place 1 spray into both nostrils daily as needed for allergies or rhinitis.    . furosemide (LASIX) 80 MG tablet Take 80 mg by mouth daily.     Marland Kitchen HYDROcodone-acetaminophen (NORCO/VICODIN) 5-325 MG tablet Take 0.5 tablets by mouth every 6 (six) hours as needed for moderate pain.    . Hypromellose (ARTIFICIAL TEARS OP) Apply 1 drop to eye 2 (two) times daily as needed (dry eyes).    . isosorbide mononitrate (IMDUR) 30 MG 24 hr tablet Take 1 tablet (30 mg total) by mouth daily. (Patient taking differently: Take 30 mg by mouth every evening. ) 90 tablet 3  . nitroGLYCERIN (NITROSTAT) 0.4 MG SL tablet PLACE 1 TABLET UNDER THE TONGUE EVERY 5 MINUTES AS NEEDED FOR CHEST PAIN UP TO 3 DOSES, IF SYMPTOMS PERSIST CALL 911 25 tablet 3  . potassium chloride SA (K-DUR,KLOR-CON) 20 MEQ tablet Take 20 mEq by mouth daily.    . ranolazine (RANEXA) 500 MG 12 hr tablet Take 1 tablet (500 mg total) by mouth 2 (two) times daily. 180 tablet 3  . Semaglutide,0.25 or 0.5MG /DOS, (OZEMPIC, 0.25 OR 0.5 MG/DOSE,) 2 MG/1.5ML SOPN Inject 0.5 mg into the skin once a week. 2 pen 5  . tadalafil (CIALIS) 20 MG tablet Take 20 mg by mouth daily as needed for erectile dysfunction.    Marland Kitchen testosterone cypionate (DEPOTESTOSTERONE CYPIONATE) 200 MG/ML injection Inject 200 mg into the muscle once a week.     . traZODone (DESYREL) 100 MG tablet Take 100 mg by mouth at bedtime.     No current facility-administered medications on file prior to visit.    Allergies  Allergen Reactions  . Crestor [Rosuvastatin] Other (See Comments)    Muscle aches   . Lipitor [Atorvastatin] Other (See Comments)    Muscle aches  . Pravastatin Other (See Comments)    Muscle aches  . Codeine Itching and Other (See Comments)    Extremity tingling-- can take synthetic   Family History   Problem Relation Age of Onset  . Other Mother        joint problems  . Hypertension Father   . Heart disease Father   . CVA Father   . Depression Father   . Heart disease Sister        CABG  . Stroke Sister   . Hypertension Sister   . Congestive Heart Failure Sister   . Diabetes Sister   . Heart disease Brother   . Hypertension Brother   . Congestive Heart Failure Brother     PE: BP 120/70   Pulse 82   Ht 5\' 10"  (1.778 m)   Wt 205 lb (93 kg)   SpO2 98%   BMI 29.41 kg/m  Wt Readings from Last 3 Encounters:  02/02/19 205 lb (93 kg)  10/21/18 221 lb (100.2 kg)  09/14/18 215 lb (97.5 kg)   Constitutional: overweight, in NAD Eyes: PERRLA, EOMI, no exophthalmos ENT: moist mucous membranes, no thyromegaly, no cervical lymphadenopathy Cardiovascular: RRR, No MRG Respiratory: CTA B Gastrointestinal: abdomen soft, NT, ND, BS+ Musculoskeletal: no deformities, strength intact in all 4 Skin: moist, warm, no rashes Neurological: no tremor with outstretched hands, DTR normal in all 4  ASSESSMENT: 1. DM2, non-insulin-dependent, uncontrolled, with long-term complications - CAD,  s/p coronaroplasty, then CABG 1995 - cardiologist - Dr. Aundra Dubin - CHF, EF 80 to 65% - Atrial fibrillation, cardioversion in 2019 - Carotid artery disease - Cerebrovascular disease, s/p TIA - CKD stage V, pending dialysis -Dr. Jimmy Footman - ED  2. HL  3.  Obesity  PLAN:  1. Patient with longstanding, uncontrolled, type 2 diabetes, off metformin due to worsening kidney function.  At last visit he was not on antidiabetic medication and he is most recent HbA1c was 8.2%.  At that time, he was not checking sugars and I strongly advised him to stop.  In the setting of his stage IV chronic kidney disease and also heart disease, I suggested a GLP-1 receptor agonist, however, unfortunately, he could not obtain this from the [place] as his HbA1c was not high enough.  Unfortunately, we cannot use an SGLT 2 inhibitor which  would be very beneficial for his heart condition, due to his poor kidney function. -At last visit I offered to refer him to nutrition but he wanted to think about this.  I did strongly advise him at that time to stop pineapple juice which has an extremely high glycemic index. -At this visit, he is not on any medication for diabetes and his sugars are very high.  We again discussed that current guidelines for diabetes management consider medication independent of HbA1c for high risk patients, and he already has CAD, CHF, and CKD, so he is very high risk for cardiovascular complications and he is on the verge of dialysis.  Insulin will not improve his cardiovascular risk so Ozempic is the next best option.  I printed and gave him a copy of  the 2019 update to management of hyperglycemia in type 2 diabetes which is a consensus report by the ADA and the ESAD.  I advised him to take this (or send this) to his [place] doctors to hopefully reconsider the decision to cover his Ozempic or any other GLP-1 receptor agonist.  For now, will try to start glipizide just to temporize his diabetes management until he can get the GLP-1 receptor agonist.  I  advised him to let me know if sugars do not improve on this, in which case, we will need to start insulin.  I hope this would be just a professional measure. - I suggested to:  Patient Instructions  Please start: - Glipizide 5 mg before b'fast and dinner  Please call the [place] and make sure they are working on approving Ozempic for you.  Please return in 3 months with your sugar log.   - today, HbA1c is 8.5% (higher) - continue checking sugars at different times of the day - check 1x a day, rotating checks - advised for yearly eye exams >> he is not UTD - Return to clinic in 3 mo with sugar log   2. HL - Reviewed latest lipid panel from 05/2018: LDL very high, triglycerides slightly high, HDL low Lab Results  Component Value Date   CHOL 245 (H) 06/17/2018   HDL 35  (L) 06/17/2018   LDLCALC 177 (H) 06/17/2018   TRIG 166 (H) 06/17/2018   CHOLHDL 7.0 06/17/2018  -Currently restarted Praluent -He had side effects from statins  3.  Obesity - lost 16 lbs since last OV! - cut down portions - will try to start GLP-1 receptor agonist, which should also help with weight loss   Philemon Kingdom, MD PhD [facility] Endocrinology

## 2019-02-02 NOTE — Patient Instructions (Signed)
Please start: - Glipizide 5 mg before b'fast and dinner  [facility] and make sure they are working on [facility] Ozempic for you.  Please return in 3 months with your sugar log.

## 2019-02-02 NOTE — Addendum Note (Signed)
Addended by: Cardell Peach I on: 02/02/2019 11:34 AM   Modules accepted: Orders

## 2019-02-08 DIAGNOSIS — H52223 Regular astigmatism, bilateral: Secondary | ICD-10-CM | POA: Diagnosis not present

## 2019-02-08 DIAGNOSIS — H5213 Myopia, bilateral: Secondary | ICD-10-CM | POA: Diagnosis not present

## 2019-02-08 DIAGNOSIS — H524 Presbyopia: Secondary | ICD-10-CM | POA: Diagnosis not present

## 2019-02-08 DIAGNOSIS — Z961 Presence of intraocular lens: Secondary | ICD-10-CM | POA: Diagnosis not present

## 2019-02-08 DIAGNOSIS — H26493 Other secondary cataract, bilateral: Secondary | ICD-10-CM | POA: Diagnosis not present

## 2019-02-08 DIAGNOSIS — H04123 Dry eye syndrome of bilateral lacrimal glands: Secondary | ICD-10-CM | POA: Diagnosis not present

## 2019-02-08 DIAGNOSIS — E119 Type 2 diabetes mellitus without complications: Secondary | ICD-10-CM | POA: Diagnosis not present

## 2019-02-08 DIAGNOSIS — H0102A Squamous blepharitis right eye, upper and lower eyelids: Secondary | ICD-10-CM | POA: Diagnosis not present

## 2019-02-08 DIAGNOSIS — H0102B Squamous blepharitis left eye, upper and lower eyelids: Secondary | ICD-10-CM | POA: Diagnosis not present

## 2019-02-14 DIAGNOSIS — Z79899 Other long term (current) drug therapy: Secondary | ICD-10-CM | POA: Diagnosis not present

## 2019-02-14 DIAGNOSIS — I120 Hypertensive chronic kidney disease with stage 5 chronic kidney disease or end stage renal disease: Secondary | ICD-10-CM | POA: Diagnosis not present

## 2019-02-14 DIAGNOSIS — I4891 Unspecified atrial fibrillation: Secondary | ICD-10-CM | POA: Diagnosis not present

## 2019-02-14 DIAGNOSIS — D631 Anemia in chronic kidney disease: Secondary | ICD-10-CM | POA: Diagnosis not present

## 2019-02-14 DIAGNOSIS — Z9889 Other specified postprocedural states: Secondary | ICD-10-CM | POA: Diagnosis not present

## 2019-02-14 DIAGNOSIS — N185 Chronic kidney disease, stage 5: Secondary | ICD-10-CM | POA: Diagnosis not present

## 2019-02-14 DIAGNOSIS — E1122 Type 2 diabetes mellitus with diabetic chronic kidney disease: Secondary | ICD-10-CM | POA: Diagnosis not present

## 2019-02-14 DIAGNOSIS — R319 Hematuria, unspecified: Secondary | ICD-10-CM | POA: Diagnosis not present

## 2019-02-14 DIAGNOSIS — I2510 Atherosclerotic heart disease of native coronary artery without angina pectoris: Secondary | ICD-10-CM | POA: Diagnosis not present

## 2019-02-14 DIAGNOSIS — N2581 Secondary hyperparathyroidism of renal origin: Secondary | ICD-10-CM | POA: Diagnosis not present

## 2019-02-14 DIAGNOSIS — E785 Hyperlipidemia, unspecified: Secondary | ICD-10-CM | POA: Diagnosis not present

## 2019-02-14 DIAGNOSIS — I770 Arteriovenous fistula, acquired: Secondary | ICD-10-CM | POA: Diagnosis not present

## 2019-02-16 ENCOUNTER — Ambulatory Visit (HOSPITAL_COMMUNITY)
Admission: RE | Admit: 2019-02-16 | Discharge: 2019-02-16 | Disposition: A | Discharge status: Home / Self Care | Payer: Medicare Other | Source: Ambulatory Visit | Attending: Cardiology | Admitting: Cardiology

## 2019-02-16 ENCOUNTER — Other Ambulatory Visit: Payer: Self-pay

## 2019-02-16 ENCOUNTER — Encounter (HOSPITAL_COMMUNITY): Payer: Self-pay | Admitting: Cardiology

## 2019-02-16 VITALS — BP 120/80 | HR 94 | Wt 204.6 lb

## 2019-02-16 DIAGNOSIS — I130 Hypertensive heart and chronic kidney disease with heart failure and stage 1 through stage 4 chronic kidney disease, or unspecified chronic kidney disease: Secondary | ICD-10-CM | POA: Diagnosis not present

## 2019-02-16 DIAGNOSIS — Z8249 Family history of ischemic heart disease and other diseases of the circulatory system: Secondary | ICD-10-CM | POA: Insufficient documentation

## 2019-02-16 DIAGNOSIS — N184 Chronic kidney disease, stage 4 (severe): Secondary | ICD-10-CM | POA: Diagnosis not present

## 2019-02-16 DIAGNOSIS — I5032 Chronic diastolic (congestive) heart failure: Secondary | ICD-10-CM | POA: Diagnosis not present

## 2019-02-16 DIAGNOSIS — Z951 Presence of aortocoronary bypass graft: Secondary | ICD-10-CM | POA: Diagnosis not present

## 2019-02-16 DIAGNOSIS — Z79899 Other long term (current) drug therapy: Secondary | ICD-10-CM | POA: Diagnosis not present

## 2019-02-16 DIAGNOSIS — I2510 Atherosclerotic heart disease of native coronary artery without angina pectoris: Secondary | ICD-10-CM | POA: Diagnosis not present

## 2019-02-16 DIAGNOSIS — I480 Paroxysmal atrial fibrillation: Secondary | ICD-10-CM | POA: Diagnosis not present

## 2019-02-16 DIAGNOSIS — Z9889 Other specified postprocedural states: Secondary | ICD-10-CM

## 2019-02-16 DIAGNOSIS — Z8673 Personal history of transient ischemic attack (TIA), and cerebral infarction without residual deficits: Secondary | ICD-10-CM | POA: Insufficient documentation

## 2019-02-16 DIAGNOSIS — Z9884 Bariatric surgery status: Secondary | ICD-10-CM | POA: Insufficient documentation

## 2019-02-16 DIAGNOSIS — I340 Nonrheumatic mitral (valve) insufficiency: Secondary | ICD-10-CM | POA: Diagnosis not present

## 2019-02-16 DIAGNOSIS — I252 Old myocardial infarction: Secondary | ICD-10-CM | POA: Insufficient documentation

## 2019-02-16 DIAGNOSIS — Z7901 Long term (current) use of anticoagulants: Secondary | ICD-10-CM | POA: Diagnosis not present

## 2019-02-16 DIAGNOSIS — E785 Hyperlipidemia, unspecified: Secondary | ICD-10-CM | POA: Diagnosis not present

## 2019-02-16 DIAGNOSIS — Z7984 Long term (current) use of oral hypoglycemic drugs: Secondary | ICD-10-CM | POA: Insufficient documentation

## 2019-02-16 DIAGNOSIS — I4892 Unspecified atrial flutter: Secondary | ICD-10-CM | POA: Diagnosis not present

## 2019-02-16 DIAGNOSIS — E1122 Type 2 diabetes mellitus with diabetic chronic kidney disease: Secondary | ICD-10-CM | POA: Diagnosis not present

## 2019-02-16 DIAGNOSIS — G4733 Obstructive sleep apnea (adult) (pediatric): Secondary | ICD-10-CM | POA: Diagnosis not present

## 2019-02-16 LAB — BASIC METABOLIC PANEL
Anion gap: 12 (ref 5–15)
BUN: 27 mg/dL — ABNORMAL HIGH (ref 8–23)
CO2: 25 mmol/L (ref 22–32)
Calcium: 9.1 mg/dL (ref 8.9–10.3)
Chloride: 101 mmol/L (ref 98–111)
Creatinine, Ser: 3.49 mg/dL — ABNORMAL HIGH (ref 0.61–1.24)
GFR calc Af Amer: 19 mL/min — ABNORMAL LOW (ref >60)
GFR calc non Af Amer: 16 mL/min — ABNORMAL LOW (ref >60)
Glucose, Bld: 160 mg/dL — ABNORMAL HIGH (ref 70–99)
Potassium: 4 mmol/L (ref 3.5–5.1)
Sodium: 138 mmol/L (ref 135–145)

## 2019-02-16 LAB — CBC
HCT: 36.7 % — ABNORMAL LOW (ref 39.0–52.0)
Hemoglobin: 12.2 g/dL — ABNORMAL LOW (ref 13.0–17.0)
MCH: 31.1 pg (ref 26.0–34.0)
MCHC: 33.2 g/dL (ref 30.0–36.0)
MCV: 93.6 fL (ref 80.0–100.0)
Platelets: 188 10*3/uL (ref 150–400)
RBC: 3.92 MIL/uL — ABNORMAL LOW (ref 4.22–5.81)
RDW: 15.2 % (ref 11.5–15.5)
WBC: 6.7 10*3/uL (ref 4.0–10.5)
nRBC: 0 % (ref 0.0–0.2)

## 2019-02-16 LAB — LIPID PANEL
Cholesterol: 185 mg/dL (ref 0–200)
HDL: 36 mg/dL — ABNORMAL LOW (ref >40)
LDL Cholesterol: 110 mg/dL — ABNORMAL HIGH (ref 0–99)
Total CHOL/HDL Ratio: 5.1 RATIO
Triglycerides: 194 mg/dL — ABNORMAL HIGH (ref <150)
VLDL: 39 mg/dL (ref 0–40)

## 2019-02-16 NOTE — Patient Instructions (Addendum)
STOP Ranalozine (RANEXA)  Labs today We will only contact you if something comes back abnormal or we need to make some changes. Otherwise no news is good news!  EKG done today  Your physician recommends that you schedule a follow-up appointment in: 4 months with Dr. Aundra Dubin. You will get a phone call to schedule this appointment in the fall.  If you do not get a call, please call our office to schedule.

## 2019-02-17 NOTE — Progress Notes (Signed)
Patient ID: [name], male   DOB: 1946/08/18, 73 y.o.   MRN: [id number] PCP: Dr Harrington Challenger Cardiology: Dr. Aundra Dubin  73 y.o. with history of CAD s/p CABG and obesity s/p lap banding procedure presents for cardiology followup.  Given exertional dyspnea and chest tightness, he had LHC in 1/15.  This showed no change from prior.  Grafts were patent and there was severe stenosis in distal D1, not amenable to PCI.   In 2017, he developed increased exertional dyspnea.  Eventually had a TEE showing severe MR with prolapse of a portion of the anterior mitral valve leaflet.  Coronary angiography in 6/17 showed patent grafts and 90% stenosis of distal diagonal not amenable to intervention.  In 8/17, he had mitral valve repair.  Post-op diastolic CHF, Lasix started and increased.   He had a cath again in 9/18 showing stable coronary anatomy.    He developed atrial arrhythmias in 12/18.  Both fibrillation and flutter were seen.  Saw Dr. Rayann Heman, recommended DCCV with amiodarone or sotalol if fibrillation recurs.  He had DCCV in 1/19.   In 6/19, he was noted to have developed AKI. Creatinine went up to 3.46.  Renal US was done, showing normal kidneys. Cause of AKI was not clear.  We stopped his Lasix. He saw nephrology and eventually went back on Lasix, currently taking 80 mg daily.  He is not using any NSAIDs.    He stopped pravastatin due to myalgias. We struggled to get him on a PCSK9 inhibitor through the [place] but he was finally able to get Praluent.    I had him do a Lexiscan Cardiolite in 11/19, which showed old inferior MI with no ischemia.  Echo in 11/19 showed EF 55-60%, s/p MV repair with mean gradient 6 mmHg.     He returns today for followup of CAD and CHF.  He has had no chest pain.  His creatinine has remained in the 3-4 range.  He does not get short of breath with exertion but does fatigue easily. 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 He now has an AV fistula.  He sees Dr.  Jimmy Footman for nephrology. Weight is down 11 lbs. BP is controlled.    ECG (personally reviewed): NSR, iLBBB (116 msec)  Labs (11/12): LDL 70 Labs (5/13): K 4.1, creatinine 1.1, BNP 48 Labs (12/13): K 3.4, creatinine 1.07, LDL 59, HDL 37 Labs (1/15): K 4.3, creatinine 1.2, HCT 40.3 Labs (3/15): LDL 38, HDL 81, BNP 55, K 4, creatinine 1.2 Labs (6/15): K 4.2, creatinine 1.4, LFTs normal, LDL 66, HDL 35 Labs (8/16): K 4, creatinine 1.6, BNP 64 Labs (12/16): LDL 61, HDL 37 Labs (4/17): K 4.2, creatinine 1.35, HCT 38.7, BNP 80 Labs (8/17): K 4.2, creatinine 1.4 => 1.66, BNP 159 Labs (12/17): K 3.6, creatinine 1.75 Labs (2/18): LDL 76, HDL 36 Labs (9/18): K 3.9, creatinine 1.76 Labs (1/19): K 4.9, creatinine 1.76, hgb 14 Labs (6/19): K 3.6, creatinine 3.46 Labs (7/19): K 4.1, creatinine 3.66, hgb 12.7 Labs (10/19): LDL 177, hgb 13.4, troponin negative, K 4, creatinine 4.15 Labs (12/19): K 4.3, creatinine 3.88  PMH: 1. OSA: on CPAP.  2. Diabetes mellitus 3. Allergic rhinitis 4. CAD: s/p CABG 1995.  LHC (10/11) with 90% distal D1 (patent proximal D1 stent), total occlusion CFX, total occlusion RCA, no significant stenoses in LAD, SVG-PDA patent, SVG-OM patent, EF 55%.  Medical management.  LHC (5/13) with patent grafts and 90% distal D1 stenosis (  no significant change from 10/11).  LHC (1/15) with patent grafts, 75% ISR in D1 stent, 90% stenosis distal D1 (small caliber). D1 not amenable to intervention.  - Coronary angiography (6/17): grafts patent, 90% stenosis distally in large diagonal not amenable to intervention.   - LHC (9/18): SVG-OM and SVG-PDA patent,  75% in-stent restenosis in proximal diagonal then 90% stenosis distally (small in caliber at that point), totally occluded LCx and RCA.  Left coronary system similar to prior caths, D1 may be source of angina.  - Lexiscan Cardiolite (11/19): EF 47%, old inferior MI, no ischemia.  5. Obesity: lap-banding in 5/11.  6. Chronic diastolic  CHF: Echo (0/76) with EF 55-60%, moderate LVH, moderate diastolic dysfunction, s/p mitral valve repair with elevated mean gradient but normal pressure half-time.  - Echo (8/18): EF 55-60%, s/p MV repair with mean gradient 6 mmHg, PASP 28 mmHg, mildly decreased RV systolic function.  - Echo (11/19): EF 55-60%, s/p MV repair with mean gradient 6, PASP 42 mmHg, mildly dilated RV, dilated IVC.  7. TIA: Carotid dopplers in 11/11 with 0-39% bilateral stenosis.  Carotid dopplers 6/17 with 1-39% RICA stenosis.  8. OA: Knees, C-spine. TKR 9/13.  9. HTN 10. Hyperlipidemia: Myalgias with atorvastatin and Crestor. Now on Praluent.  11. Diverticulosis 12. MItral regurgitation: TEE (6/17) with EF 55-60%, severe eccentric MR, prolapse of a portion of the anterior leaflet, peak RV-RA gradient 45 mmHg.  - Mitral valve repair 8/17 13. CKD: Stage III.  14. Atrial fibrillation/atrial flutter: Both arrhythmias noted in 12/18.  DCCV 12/19.   15. AKI 6/19 => CKD stage 4: uncertain etiology.  Renal US was normal.   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  Lives in [place].   Family History  Problem Relation Age of Onset  . Other Mother        joint problems  . Hypertension Father   . Heart disease Father   . CVA Father   . Depression Father   . Heart disease Sister        CABG  . Stroke Sister   . Hypertension Sister   . Congestive Heart Failure Sister   . Diabetes Sister   . Heart disease Brother   . Hypertension Brother   . Congestive Heart Failure Brother     ROS: All systems reviewed and negative except as per HPI.    Current Outpatient Medications  Medication Sig Dispense Refill  . Alirocumab (PRALUENT) 150 MG/ML SOAJ Inject 1 pen into the skin every 14 (fourteen) days. 2 pen 11  . amoxicillin (AMOXIL) 500 MG tablet Take 4 tablets (2 g) by mouth 30-60 minutes prior to dental appointment 12 tablet 0  . apixaban (ELIQUIS) 5 MG TABS tablet Take 1 tablet (5 mg total) by  mouth 2 (two) times daily. 180 tablet 3  . Artificial Saliva (MOUTH KOTE MT) Use as directed 4 sprays in the mouth or throat every 3 (three) hours as needed (for dry mouth).    . calcitRIOL (ROCALTROL) 0.25 MCG capsule Take 0.25 mcg by mouth daily.    . carvedilol (COREG) 3.125 MG tablet Take 3.125 mg by mouth 2 (two) times daily.    . cetirizine (ZYRTEC) 10 MG tablet Take 10 mg by mouth daily.     . fluticasone (FLONASE) 50 MCG/ACT nasal spray Place 1 spray into both nostrils daily as needed for allergies or rhinitis.    . furosemide (LASIX) 80 MG tablet Take 80 mg by mouth daily.     Marland Kitchen  glipiZIDE (GLUCOTROL) 5 MG tablet Take 1 tablet (5 mg total) by mouth 2 (two) times daily before a meal. 180 tablet 3  . HYDROcodone-acetaminophen (NORCO/VICODIN) 5-325 MG tablet Take 0.5 tablets by mouth every 6 (six) hours as needed for moderate pain.    . Hypromellose (ARTIFICIAL TEARS OP) Apply 1 drop to eye 2 (two) times daily as needed (dry eyes).    . isosorbide mononitrate (IMDUR) 30 MG 24 hr tablet Take 1 tablet (30 mg total) by mouth daily. (Patient taking differently: Take 30 mg by mouth every evening. ) 90 tablet 3  . nitroGLYCERIN (NITROSTAT) 0.4 MG SL tablet PLACE 1 TABLET UNDER THE TONGUE EVERY 5 MINUTES AS NEEDED FOR CHEST PAIN UP TO 3 DOSES, IF SYMPTOMS PERSIST CALL 911 25 tablet 3  . potassium chloride SA (K-DUR,KLOR-CON) 20 MEQ tablet Take 20 mEq by mouth daily.    . traZODone (DESYREL) 100 MG tablet Take 100 mg by mouth at bedtime.     No current facility-administered medications for this encounter.     BP 120/80   Pulse 94   Wt 92.8 kg (204 lb 9.6 oz)   SpO2 97%   BMI 29.36 kg/m  General: NAD Neck: No JVD, no thyromegaly or thyroid nodule.  Lungs: Clear to auscultation bilaterally with normal respiratory effort. CV: Nondisplaced PMI.  Heart regular S1/S2, no S3/S4, no murmur.  No peripheral edema.  No carotid bruit.  Normal pedal pulses.  Abdomen: Soft, nontender, no  hepatosplenomegaly, no distention.  Skin: Intact without lesions or rashes.  Neurologic: Alert and oriented x 3.  Psych: Normal affect. Extremities: No clubbing or cyanosis.  HEENT: Normal.   Assessment/Plan: 1. S/p mitral valve repair:  Echo 11/19 showed stable mitral valve repair compared to prior echo with no MR, mean gradient 6 mmHg.   - With prosthetic material, should use antibiotic prophylaxis with dental work.  2. CAD:  He has severe stenosis in a distal D1 that is not amenable to intervention.  This has been stable on last couple of caths. He has occluded RCA and LCx known from the past with SVGs to OM and PDA.  EF remains preserved by echo. Cardiolite in 11/19 showed no ischemia. No further chest pain.  - No ASA with apixaban use.  - Continue Coreg and Imdur.  With no chest pain and worsening renal function, I am going to go ahead and stop ranolazine.  - He cannot tolerate statins with myalgias, finally on Praluent.    3. Hyperlipidemia: Myalgias with Crestor, pravastatin, and atorvastatin.  We were finally able to get him Praluent through the [place].  - Check lipids today.   4. Chronic diastolic CHF:  NYHA class II symptoms (no dyspnea but prominent fatigue). - Continue Lasix 80 mg daily.  With CKD stage IV, Dr. Jimmy Footman has been adjusting.    - BMET today.  5. OSA: Continue CPAP.  6. Atrial fibrillation: Paroxysmal, in NSR after DCCV in 1/19.  If recurs, may need amiodarone to maintain NSR.  - Continue apixaban 5 mg bid. CBC today.  7. CKD stage 4: AKI without recovery, uncertain etiology.  He sees nephrology, there is concern that he is progressing towards HD. He now has AV fistula.  - Check BMET today.  8. Diabetes: He is now followed by endocrinology.    Followup 4 mos  [name] 02/17/2019

## 2019-02-20 ENCOUNTER — Telehealth (HOSPITAL_COMMUNITY): Payer: Self-pay

## 2019-02-20 ENCOUNTER — Other Ambulatory Visit (HOSPITAL_COMMUNITY): Payer: Self-pay | Admitting: *Deleted

## 2019-02-20 MED ORDER — PRALUENT 150 MG/ML ~~LOC~~ SOAJ: 1.0000 "pen " | SUBCUTANEOUS | 11 refills | Status: DC

## 2019-02-20 NOTE — Telephone Encounter (Signed)
-----   Message from [name], MD sent at 02/16/2019  9:52 PM EDT ----- Stable creatinine.  LDL still above goal at 110.  Increase Praluent to 150 every 2 wks (think he is taking 75 every 2 wks).

## 2019-02-20 NOTE — Telephone Encounter (Signed)
(  see lab 02/17/19)   Pt called back stating he's taking praluent 75mg  every 2 weeks. Requests we leave script for 150mg  at front desk so he can take to the [place]. Script for Praluent 150mg  left at front desk to be picked up.

## 2019-03-22 DIAGNOSIS — Z4651 Encounter for fitting and adjustment of gastric lap band: Secondary | ICD-10-CM | POA: Diagnosis not present

## 2019-04-19 DIAGNOSIS — R319 Hematuria, unspecified: Secondary | ICD-10-CM | POA: Diagnosis not present

## 2019-04-19 DIAGNOSIS — E1122 Type 2 diabetes mellitus with diabetic chronic kidney disease: Secondary | ICD-10-CM | POA: Diagnosis not present

## 2019-04-19 DIAGNOSIS — N189 Chronic kidney disease, unspecified: Secondary | ICD-10-CM | POA: Diagnosis not present

## 2019-04-19 DIAGNOSIS — I770 Arteriovenous fistula, acquired: Secondary | ICD-10-CM | POA: Diagnosis not present

## 2019-04-19 DIAGNOSIS — M109 Gout, unspecified: Secondary | ICD-10-CM | POA: Diagnosis not present

## 2019-04-19 DIAGNOSIS — I2510 Atherosclerotic heart disease of native coronary artery without angina pectoris: Secondary | ICD-10-CM | POA: Diagnosis not present

## 2019-04-19 DIAGNOSIS — D631 Anemia in chronic kidney disease: Secondary | ICD-10-CM | POA: Diagnosis not present

## 2019-04-19 DIAGNOSIS — N2581 Secondary hyperparathyroidism of renal origin: Secondary | ICD-10-CM | POA: Diagnosis not present

## 2019-04-19 DIAGNOSIS — I120 Hypertensive chronic kidney disease with stage 5 chronic kidney disease or end stage renal disease: Secondary | ICD-10-CM | POA: Diagnosis not present

## 2019-04-19 DIAGNOSIS — Z79899 Other long term (current) drug therapy: Secondary | ICD-10-CM | POA: Diagnosis not present

## 2019-04-19 DIAGNOSIS — Z9889 Other specified postprocedural states: Secondary | ICD-10-CM | POA: Diagnosis not present

## 2019-04-19 DIAGNOSIS — N185 Chronic kidney disease, stage 5: Secondary | ICD-10-CM | POA: Diagnosis not present

## 2019-04-19 DIAGNOSIS — I4891 Unspecified atrial fibrillation: Secondary | ICD-10-CM | POA: Diagnosis not present

## 2019-05-09 ENCOUNTER — Telehealth (HOSPITAL_COMMUNITY): Payer: Self-pay

## 2019-05-09 NOTE — Telephone Encounter (Signed)
  See above phone note.   Advise to go to ED for further evaluation. He has coronary disease and history of Paroxysmal A fib.   Amy Clegg NP-C  4:08 PM

## 2019-05-09 NOTE — Telephone Encounter (Signed)
Received message from patient stating he is having dizziness, chest pain, decreased energy and off balanced. He is requesting an appt with office.   LM for patient to call office back to discuss

## 2019-05-09 NOTE — Telephone Encounter (Signed)
Pt advised that he should go to ER however he refuses at this time.  Advised that he should be evaluated based on his history however patient continues to decline and would rather come to office. Per amy, ok to add on for tomorrow for evaluation, labs and ekg.  Pt made aware of same.

## 2019-05-09 NOTE — Telephone Encounter (Signed)
Pt called back to report that he has not been feeling well for about 4-5 days.  He reports that he has low energy, possibly irregular heart rhythm, dizziness and feeling like he will pass out if he bends over.  Advised he will possibly need nursing visit to assess bp and ekg for assessment of heart rhythm.  Routed to NP for advice.

## 2019-05-10 ENCOUNTER — Other Ambulatory Visit: Payer: Self-pay

## 2019-05-10 ENCOUNTER — Ambulatory Visit (HOSPITAL_COMMUNITY)
Admission: RE | Admit: 2019-05-10 | Discharge: 2019-05-10 | Disposition: A | Discharge status: Home / Self Care | Payer: Medicare Other | Source: Ambulatory Visit | Attending: Adult Health | Admitting: Adult Health

## 2019-05-10 ENCOUNTER — Encounter (HOSPITAL_COMMUNITY): Payer: Self-pay

## 2019-05-10 VITALS — BP 120/84 | HR 76 | Wt 206.0 lb

## 2019-05-10 DIAGNOSIS — Z7951 Long term (current) use of inhaled steroids: Secondary | ICD-10-CM | POA: Insufficient documentation

## 2019-05-10 DIAGNOSIS — Z8673 Personal history of transient ischemic attack (TIA), and cerebral infarction without residual deficits: Secondary | ICD-10-CM | POA: Diagnosis not present

## 2019-05-10 DIAGNOSIS — Z79899 Other long term (current) drug therapy: Secondary | ICD-10-CM | POA: Insufficient documentation

## 2019-05-10 DIAGNOSIS — Z823 Family history of stroke: Secondary | ICD-10-CM | POA: Diagnosis not present

## 2019-05-10 DIAGNOSIS — Z8249 Family history of ischemic heart disease and other diseases of the circulatory system: Secondary | ICD-10-CM | POA: Diagnosis not present

## 2019-05-10 DIAGNOSIS — I480 Paroxysmal atrial fibrillation: Secondary | ICD-10-CM | POA: Diagnosis not present

## 2019-05-10 DIAGNOSIS — R079 Chest pain, unspecified: Secondary | ICD-10-CM | POA: Diagnosis not present

## 2019-05-10 DIAGNOSIS — I130 Hypertensive heart and chronic kidney disease with heart failure and stage 1 through stage 4 chronic kidney disease, or unspecified chronic kidney disease: Secondary | ICD-10-CM | POA: Insufficient documentation

## 2019-05-10 DIAGNOSIS — Z7984 Long term (current) use of oral hypoglycemic drugs: Secondary | ICD-10-CM | POA: Insufficient documentation

## 2019-05-10 DIAGNOSIS — Z951 Presence of aortocoronary bypass graft: Secondary | ICD-10-CM | POA: Diagnosis not present

## 2019-05-10 DIAGNOSIS — E1122 Type 2 diabetes mellitus with diabetic chronic kidney disease: Secondary | ICD-10-CM | POA: Diagnosis not present

## 2019-05-10 DIAGNOSIS — E785 Hyperlipidemia, unspecified: Secondary | ICD-10-CM | POA: Insufficient documentation

## 2019-05-10 DIAGNOSIS — Z7901 Long term (current) use of anticoagulants: Secondary | ICD-10-CM | POA: Diagnosis not present

## 2019-05-10 DIAGNOSIS — I252 Old myocardial infarction: Secondary | ICD-10-CM | POA: Insufficient documentation

## 2019-05-10 DIAGNOSIS — I5032 Chronic diastolic (congestive) heart failure: Secondary | ICD-10-CM | POA: Insufficient documentation

## 2019-05-10 DIAGNOSIS — G4733 Obstructive sleep apnea (adult) (pediatric): Secondary | ICD-10-CM | POA: Diagnosis not present

## 2019-05-10 DIAGNOSIS — R0789 Other chest pain: Secondary | ICD-10-CM | POA: Insufficient documentation

## 2019-05-10 DIAGNOSIS — Z9884 Bariatric surgery status: Secondary | ICD-10-CM | POA: Diagnosis not present

## 2019-05-10 DIAGNOSIS — I2510 Atherosclerotic heart disease of native coronary artery without angina pectoris: Secondary | ICD-10-CM | POA: Insufficient documentation

## 2019-05-10 DIAGNOSIS — N184 Chronic kidney disease, stage 4 (severe): Secondary | ICD-10-CM | POA: Diagnosis not present

## 2019-05-10 LAB — CBC
HCT: 42.8 % (ref 39.0–52.0)
Hemoglobin: 14.7 g/dL (ref 13.0–17.0)
MCH: 32.4 pg (ref 26.0–34.0)
MCHC: 34.3 g/dL (ref 30.0–36.0)
MCV: 94.3 fL (ref 80.0–100.0)
Platelets: 199 10*3/uL (ref 150–400)
RBC: 4.54 MIL/uL (ref 4.22–5.81)
RDW: 12 % (ref 11.5–15.5)
WBC: 6.7 10*3/uL (ref 4.0–10.5)
nRBC: 0 % (ref 0.0–0.2)

## 2019-05-10 LAB — BASIC METABOLIC PANEL
Anion gap: 11 (ref 5–15)
BUN: 39 mg/dL — ABNORMAL HIGH (ref 8–23)
CO2: 25 mmol/L (ref 22–32)
Calcium: 9.8 mg/dL (ref 8.9–10.3)
Chloride: 100 mmol/L (ref 98–111)
Creatinine, Ser: 3.39 mg/dL — ABNORMAL HIGH (ref 0.61–1.24)
GFR calc Af Amer: 20 mL/min — ABNORMAL LOW (ref >60)
GFR calc non Af Amer: 17 mL/min — ABNORMAL LOW (ref >60)
Glucose, Bld: 146 mg/dL — ABNORMAL HIGH (ref 70–99)
Potassium: 3.6 mmol/L (ref 3.5–5.1)
Sodium: 136 mmol/L (ref 135–145)

## 2019-05-10 MED ORDER — ISOSORBIDE MONONITRATE ER 60 MG PO TB24: 60.0000 mg | ORAL_TABLET | Freq: Every day | ORAL | 3 refills | Status: DC

## 2019-05-10 NOTE — Progress Notes (Signed)
Patient ID: SEIJI WISWELL, male   DOB: [birth year], 72 y.o.   MRN: [id number] PCP: Dr Harrington Challenger Cardiology: Dr. Aundra Dubin  73 y.o. with history of CAD s/p CABG and obesity s/p lap banding procedure presents for cardiology followup.  Given exertional dyspnea and chest tightness, he had LHC in 1/15.  This showed no change from prior.  Grafts were patent and there was severe stenosis in distal D1, not amenable to PCI.   In 2017, he developed increased exertional dyspnea.  Eventually had a TEE showing severe MR with prolapse of a portion of the anterior mitral valve leaflet.  Coronary angiography in 6/17 showed patent grafts and 90% stenosis of distal diagonal not amenable to intervention.  In 8/17, he had mitral valve repair.  Post-op diastolic CHF, Lasix started and increased.   He had a cath again in 9/18 showing stable coronary anatomy.    He developed atrial arrhythmias in 12/18.  Both fibrillation and flutter were seen.  Saw Dr. Rayann Heman, recommended DCCV with amiodarone or sotalol if fibrillation recurs.  He had DCCV in 1/19.   In 6/19, he was noted to have developed AKI. Creatinine went up to 3.46.  Renal US was done, showing normal kidneys. Cause of AKI was not clear.  We stopped his Lasix. He saw nephrology and eventually went back on Lasix, currently taking 80 mg daily.  He is not using any NSAIDs.    He stopped pravastatin due to myalgias. We struggled to get him on a PCSK9 inhibitor through the [place] but he was finally able to get Praluent.    I had him do a Lexiscan Cardiolite in 11/19, which showed old inferior MI with no ischemia.  Echo in 11/19 showed EF 55-60%, s/p MV repair with mean gradient 6 mmHg.     Today he returns for HF follow up. Yesterday he called the HF clinic due to dizziness and chest pain. Today he is feeling much better. SOB with steps.   Denies PND/Orthopnea. Not  Much energy. Says he stopped antidepressant 7 days ago. Appetite ok. No fever or chills. Weight at home 2020  pounds.  Taking all medications.   ECG  NSR 71 bpm personally reviewed.   Labs (11/12): LDL 70 Labs (5/13): K 4.1, creatinine 1.1, BNP 48 Labs (12/13): K 3.4, creatinine 1.07, LDL 59, HDL 37 Labs (1/15): K 4.3, creatinine 1.2, HCT 40.3 Labs (3/15): LDL 38, HDL 81, BNP 55, K 4, creatinine 1.2 Labs (6/15): K 4.2, creatinine 1.4, LFTs normal, LDL 66, HDL 35 Labs (8/16): K 4, creatinine 1.6, BNP 64 Labs (12/16): LDL 61, HDL 37 Labs (4/17): K 4.2, creatinine 1.35, HCT 38.7, BNP 80 Labs (8/17): K 4.2, creatinine 1.4 => 1.66, BNP 159 Labs (12/17): K 3.6, creatinine 1.75 Labs (2/18): LDL 76, HDL 36 Labs (9/18): K 3.9, creatinine 1.76 Labs (1/19): K 4.9, creatinine 1.76, hgb 14 Labs (6/19): K 3.6, creatinine 3.46 Labs (7/19): K 4.1, creatinine 3.66, hgb 12.7 Labs (10/19): LDL 177, hgb 13.4, troponin negative, K 4, creatinine 4.15 Labs (12/19): K 4.3, creatinine 3.88  PMH: 1. OSA: on CPAP.  2. Diabetes mellitus 3. Allergic rhinitis 4. CAD: s/p CABG 1995.  LHC (10/11) with 90% distal D1 (patent proximal D1 stent), total occlusion CFX, total occlusion RCA, no significant stenoses in LAD, SVG-PDA patent, SVG-OM patent, EF 55%.  Medical management.  LHC (5/13) with patent grafts and 90% distal D1 stenosis (no significant change from 10/11).  LHC (1/15) with patent grafts, 75% ISR in  D1 stent, 90% stenosis distal D1 (small caliber). D1 not amenable to intervention.  - Coronary angiography (6/17): grafts patent, 90% stenosis distally in large diagonal not amenable to intervention.   - LHC (9/18): SVG-OM and SVG-PDA patent,  75% in-stent restenosis in proximal diagonal then 90% stenosis distally (small in caliber at that point), totally occluded LCx and RCA.  Left coronary system similar to prior caths, D1 may be source of angina.  - Lexiscan Cardiolite (11/19): EF 47%, old inferior MI, no ischemia.  5. Obesity: lap-banding in 5/11.  6. Chronic diastolic CHF: Echo (1/19) with EF 55-60%, moderate LVH, moderate  diastolic dysfunction, s/p mitral valve repair with elevated mean gradient but normal pressure half-time.  - Echo (8/18): EF 55-60%, s/p MV repair with mean gradient 6 mmHg, PASP 28 mmHg, mildly decreased RV systolic function.  - Echo (11/19): EF 55-60%, s/p MV repair with mean gradient 6, PASP 42 mmHg, mildly dilated RV, dilated IVC.  7. TIA: Carotid dopplers in 11/11 with 0-39% bilateral stenosis.  Carotid dopplers 6/17 with 1-39% RICA stenosis.  8. OA: Knees, C-spine. TKR 9/13.  9. HTN 10. Hyperlipidemia: Myalgias with atorvastatin and Crestor. Now on Praluent.  11. Diverticulosis 12. MItral regurgitation: TEE (6/17) with EF 55-60%, severe eccentric MR, prolapse of a portion of the anterior leaflet, peak RV-RA gradient 45 mmHg.  - Mitral valve repair 8/17 13. CKD: Stage III.  14. Atrial fibrillation/atrial flutter: Both arrhythmias noted in 12/18.  DCCV 12/19.   15. AKI 6/19 => CKD stage 4: uncertain etiology.  Renal US was normal.   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  Lives in [place].   Family History  Problem Relation Age of Onset  . Other Mother        joint problems  . Hypertension Father   . Heart disease Father   . CVA Father   . Depression Father   . Heart disease Sister        CABG  . Stroke Sister   . Hypertension Sister   . Congestive Heart Failure Sister   . Diabetes Sister   . Heart disease Brother   . Hypertension Brother   . Congestive Heart Failure Brother     ROS: All systems reviewed and negative except as per HPI.    Current Outpatient Medications  Medication Sig Dispense Refill  . Alirocumab (PRALUENT) 150 MG/ML SOAJ Inject 1 pen into the skin every 14 (fourteen) days. 2 pen 11  . amoxicillin (AMOXIL) 500 MG tablet Take 4 tablets (2 g) by mouth 30-60 minutes prior to dental appointment 12 tablet 0  . apixaban (ELIQUIS) 5 MG TABS tablet Take 1 tablet (5 mg total) by mouth 2 (two) times daily. 180 tablet 3  . Artificial  Saliva (MOUTH KOTE MT) Use as directed 4 sprays in the mouth or throat every 3 (three) hours as needed (for dry mouth).    . calcitRIOL (ROCALTROL) 0.25 MCG capsule Take 0.25 mcg by mouth daily.    . carvedilol (COREG) 3.125 MG tablet Take 3.125 mg by mouth 2 (two) times daily.    . cetirizine (ZYRTEC) 10 MG tablet Take 10 mg by mouth daily.     . fluticasone (FLONASE) 50 MCG/ACT nasal spray Place 1 spray into both nostrils daily as needed for allergies or rhinitis.    . furosemide (LASIX) 80 MG tablet Take 80 mg by mouth daily.     Marland Kitchen glipiZIDE (GLUCOTROL) 5 MG tablet Take 1 tablet (5  mg total) by mouth 2 (two) times daily before a meal. 180 tablet 3  . HYDROcodone-acetaminophen (NORCO/VICODIN) 5-325 MG tablet Take 0.5 tablets by mouth every 6 (six) hours as needed for moderate pain.    . Hypromellose (ARTIFICIAL TEARS OP) Apply 1 drop to eye 2 (two) times daily as needed (dry eyes).    . nitroGLYCERIN (NITROSTAT) 0.4 MG SL tablet PLACE 1 TABLET UNDER THE TONGUE EVERY 5 MINUTES AS NEEDED FOR CHEST PAIN UP TO 3 DOSES, IF SYMPTOMS PERSIST CALL 911 25 tablet 3  . potassium chloride SA (K-DUR,KLOR-CON) 20 MEQ tablet Take 20 mEq by mouth daily.    . traZODone (DESYREL) 100 MG tablet Take 100 mg by mouth at bedtime.    . isosorbide mononitrate (IMDUR) 30 MG 24 hr tablet Take 1 tablet (30 mg total) by mouth daily. (Patient taking differently: Take 30 mg by mouth every evening. ) 90 tablet 3   No current facility-administered medications for this encounter.     BP 120/84   Pulse 76   Wt 93.4 kg (206 lb)   SpO2 98%   BMI 29.56 kg/m   Wt Readings from Last 3 Encounters:  05/10/19 93.4 kg (206 lb)  02/16/19 92.8 kg (204 lb 9.6 oz)  02/02/19 93 kg (205 lb)   General:  Well appearing. No resp difficulty HEENT: normal Neck: supple. no JVD. Carotids 2+ bilat; no bruits. No lymphadenopathy or thryomegaly appreciated. Cor: PMI nondisplaced. Regular rate & rhythm. No rubs, gallops or murmurs. Lungs:  clear Abdomen: soft, nontender, nondistended. No hepatosplenomegaly. No bruits or masses. Good bowel sounds. Extremities: no cyanosis, clubbing, rash, edema Neuro: alert & orientedx3, cranial nerves grossly intact. moves all 4 extremities w/o difficulty. Affect pleasant  Assessment/Plan: 1. S/p mitral valve repair:  Echo 11/19 showed stable mitral valve repair compared to prior echo with no MR, mean gradient 6 mmHg.   - With prosthetic material, should use antibiotic prophylaxis with dental work.  2. CAD:  He has severe stenosis in a distal D1 that is not amenable to intervention.  This has been stable on last couple of caths. He has occluded RCA and LCx known from the past with SVGs to OM and PDA.  EF remains preserved by echo. Cardiolite in 11/19 showed no ischemia.  - Having occasional chest tightness. No EKG changes noted.  - No ASA with apixaban use.  - Continue Coreg. Increase Imdur to 60 mg daily.    - He cannot tolerate statins with myalgias, finally on Praluent.    3. Hyperlipidemia: Myalgias with Crestor, pravastatin, and atorvastatin.  We were finally able to get him Praluent through the [place].    4. Chronic diastolic CHF:   NYHA II-III. Volume status stable. No change in diuretics.    With CKD stage IV, Dr. Jimmy Footman has been adjusting.    - BMET today.  5. OSA: Continue CPAP.  6. Atrial fibrillation: Paroxysmal, in NSR after DCCV in 1/19.  If recurs, may need amiodarone to maintain NSR.  -In NSR today.  - Continue apixaban 5 mg bid.  -CBC today 7. CKD stage 4: AKI without recovery, uncertain etiology.  He sees nephrology, there is concern that he is progressing towards HD. He now has AV fistula.  - Check BMET today.  8. Diabetes: He is now followed by endocrinology.    BMET and CBC results reviewed and discussed with him.  Follow up with Dr Aundra Dubin in 3-4 months.  Coltan Spinello 05/10/2019

## 2019-05-10 NOTE — Patient Instructions (Addendum)
Lab work done today. We will notify you of any abnormal lab work. No news is good news!  INCREASE Imdur to 60mg  daily  Please follow up with the [facility] in 8 weeks.  At the [facility], you and your health needs are our priority. As part of our continuing mission to provide you with exceptional heart care, we have created designated Provider Care Teams. These Care Teams include your primary Cardiologist (physician) and Advanced Practice Providers (APPs- Physician Assistants and Nurse Practitioners) who all work together to provide you with the care you need, when you need it.   You may see any of the following providers on your designated Care Team at your next follow up: Marland Kitchen Dr Glori Bickers . Dr [name] . Darrick Grinder, NP   Please be sure to bring in all your medications bottles to every appointment.

## 2019-05-19 ENCOUNTER — Other Ambulatory Visit: Payer: Self-pay

## 2019-05-23 ENCOUNTER — Ambulatory Visit (INDEPENDENT_AMBULATORY_CARE_PROVIDER_SITE_OTHER): Payer: Medicare Other | Admitting: Internal Medicine

## 2019-05-23 ENCOUNTER — Other Ambulatory Visit: Payer: Self-pay

## 2019-05-23 ENCOUNTER — Encounter: Payer: Self-pay | Admitting: Internal Medicine

## 2019-05-23 VITALS — BP 120/82 | HR 81 | Ht 68.0 in | Wt 203.0 lb

## 2019-05-23 DIAGNOSIS — I2510 Atherosclerotic heart disease of native coronary artery without angina pectoris: Secondary | ICD-10-CM

## 2019-05-23 DIAGNOSIS — E1159 Type 2 diabetes mellitus with other circulatory complications: Secondary | ICD-10-CM

## 2019-05-23 DIAGNOSIS — E1165 Type 2 diabetes mellitus with hyperglycemia: Secondary | ICD-10-CM | POA: Diagnosis not present

## 2019-05-23 DIAGNOSIS — E669 Obesity, unspecified: Secondary | ICD-10-CM

## 2019-05-23 DIAGNOSIS — E782 Mixed hyperlipidemia: Secondary | ICD-10-CM

## 2019-05-23 LAB — POCT GLYCOSYLATED HEMOGLOBIN (HGB A1C): Hemoglobin A1C: 7.1 % — AB (ref 4.0–5.6)

## 2019-05-23 MED ORDER — GLIPIZIDE 5 MG PO TABS: 5.0000 mg | ORAL_TABLET | Freq: Every day | ORAL | 3 refills | Status: DC

## 2019-05-23 MED ORDER — OZEMPIC (0.25 OR 0.5 MG/DOSE) 2 MG/1.5ML ~~LOC~~ SOPN: 0.5000 mg | PEN_INJECTOR | SUBCUTANEOUS | 3 refills | Status: DC

## 2019-05-23 NOTE — Progress Notes (Signed)
Patient ID: [name], male   DOB: [birth year], 73 y.o.   MRN: [id number]   HPI: [name] is a 73 y.o.-year-old male, initially referred by his nephrologist, Dr. Jimmy [name], returning for follow-up for DM2 2/2 [place], dx in 1990s, non-insulin-dependent, uncontrolled, with long-term complications (CAD- s/p CABG 1990s, CHF, A. fib, CKD stage V, cerebrovascular disease, s/p TIA, ED).  Last visit 3.5 months ago.  Reviewed latest HbA1c levels: Lab Results  Component Value Date   HGBA1C 8.5 (A) 02/02/2019   HGBA1C 7.2 (H) 03/23/2016   HGBA1C 6.5 (H) 08/20/2012  01/11/2019: HbA1c 8.3% 10/04/2018: HbA1c 8.2%  In the past, I advised him to start: - Ozempic >> initially he could not start this as the [place] wanted him to try oral medications first before approving the GLP-1 receptor agonist since his HbA1c was "not high enough".  I did let them know that his GFR is 15 so the options are either insulin or the GLP-1 receptor agonist.  This was finally approved and he started the med ~1.5 mo ago >> 0.5 mg weekly. He came off metformin due to worsening kidney function. Stopped Glipizide 5 mg before b'fast and dinner when we started Ozempic  Pt checks his sugars 1x a day: - am: n/c >> 169-299 >> 161-185, 193 - 2h after b'fast: n/c - before lunch: n/c - 2h after lunch: n/c - before dinner: 129 - 2h after dinner: n/c - bedtime: n/c - nighttime: n/c Lowest sugar was 80s (not recently) >> 169 >> 161; he has hypoglycemia awareness in the 70s Highest sugar was 200 >> 299 >> 193.  Glucometer: ?  Pt's meals are: - Breakfast: egg and bacon (not lately).   - Brunch: meat + veggies - Dinner: same as lunch - Snacks: crackers, popcorn   -+ Stage V CKD, seeing nephrology, had fistula placed >> no need for dialysis yet; last BUN/creatinine:  Lab Results  Component Value Date   BUN 39 (H) 05/10/2019   BUN 27 (H) 02/16/2019   CREATININE 3.39 (H) 05/10/2019   CREATININE 3.49 (H) 02/16/2019   01/11/2019: 45//3.79, GFR 16, glucose 149, ACR 52.5 10/04/2018: 32/3.78, GFR 15, glucose 184 He is not on an ACE inhibitor/ARB  -+ HL; last set of lipids: Lab Results  Component Value Date   CHOL 185 02/16/2019   HDL 36 (L) 02/16/2019   LDLCALC 110 (H) 02/16/2019   TRIG 194 (H) 02/16/2019   CHOLHDL 5.1 02/16/2019  He was on pravastatin but had to stop due to generalized aches and pains.  He is off Zetia.  Currently on Praluent every 2 weeks  - last eye exam was in 01/2019: Reportedly no DR. Dr. Katy [name].  -He denies numbness and tingling in his feet.  Pt has FH of DM in sister and brother.  He has a history of lap band surgery 2010. Lost from 373 lbs to 215-220 lbs.  He also has a history of HTN.  ROS: Constitutional: no weight gain/no weight loss, no fatigue, no subjective hyperthermia, no subjective hypothermia Eyes: no blurry vision, no xerophthalmia ENT: no sore throat, no nodules palpated in neck, no dysphagia, no odynophagia, no hoarseness Cardiovascular: no CP/no SOB/no palpitations/no leg swelling Respiratory: no cough/no SOB/no wheezing Gastrointestinal: no N/no V/no D/no C/no acid reflux Musculoskeletal: no muscle aches/no joint aches Skin: no rashes, no hair loss Neurological: no tremors/no numbness/no tingling/no dizziness  I reviewed pt's medications, allergies, PMH, social hx, family hx, and changes were documented in the history of  present illness. Otherwise, unchanged from my initial visit note.  Past Medical History:  Diagnosis Date  . Allergic rhinitis   . Anxiety   . Atrial fibrillation ([place])   . Cancer ([place])    hx of skin cancers   . Chronic kidney disease   . Coronary artery disease    a. s/p MI & CABG; b. s/p PCI Diag;  c. Cath 12/2011: LAD diff dzs/small, Diag 75% isr, 90 dist to stent (small), LCX & RCA occluded, VG->OM 40, VG->PDA patent - med rx.  . Depression    PTSD  . Diabetes mellitus    not on medications  . Diastolic CHF, chronic (HCC)     a. EF 55-60% by echo 2007  . Dysrhythmia    "abnormal beat"  . GERD (gastroesophageal reflux disease)    "not anymore" " I had a lap band"  . History of diverticulitis of colon    5 YRS AGO  . History of pneumonia    2017  . History of transient ischemic attack (TIA)    CAROTID DOPPLERS NOV 2011  0-39& BIL. STENOSIS  . Hyperlipidemia   . Hypertension   . Mitral regurgitation   . Myocardial infarction ([place])   . Neuromuscular disorder ([place])    left arm numbness   . 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 PTSD (post-traumatic stress disorder)    from [place]  . 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 Shortness of breath dyspnea    with exertion  . Sleep apnea    cpap- see ov note in [facility] 05/12/11 for settings    Past Surgical History:  Procedure Laterality Date  . AV FISTULA PLACEMENT Left 08/02/2018   Procedure: ARTERIOVENOUS (AV) FISTULA CREATION RADIOCEPHALIC;  Surgeon: Jason Mould, MD;  Location: [place];  Service: Vascular;  Laterality: Left;  . BLEPHAROPLASTY  1985   BILATERAL  . CARDIAC CATHETERIZATION  2001, 2004, 2009, 2011  . CARDIAC CATHETERIZATION N/A 01/23/2016   Procedure: Right/Left Heart Cath and Coronary Angiography;  Surgeon: Jason Dresser, MD;  Location: [place] CV LAB;  Service: Cardiovascular;  Laterality: N/A;  . CARDIOVERSION N/A 09/07/2017   Procedure: CARDIOVERSION;  Surgeon: Jason Dresser, MD;  Location: [facility] ENDOSCOPY;  Service: Cardiovascular;  Laterality: N/A;  . CATARACT EXTRACTION W/ INTRAOCULAR LENS IMPLANT Bilateral   . CHONDROPLASTY  07/22/2011   Procedure: CHONDROPLASTY;  Surgeon: [name];  Location: [place];  Service: Orthopedics;;  . CIRCUMCISION  35 YRS   AGO  . COLONOSCOPY    . CORONARY ANGIOPLASTY  1996-- POST CABG   W/ STENT, last cath 01/07/2012   . CORONARY ARTERY BYPASS GRAFT  1995   X3 VESSEL  . CORONARY STENT PLACEMENT    . KNEE ARTHROSCOPY  07/22/2011   Procedure: ARTHROSCOPY KNEE;  Surgeon: [name];  Location: [place];  Service: Orthopedics;  Laterality: Right;  WITH DEBRIDEMENT   . KNEE ARTHROSCOPY W/ MENISCECTOMY  X2 IN 2002-- RIGHT KNEE  . LAPAROSCOPIC GASTRIC BANDING  01-15-09  . LEFT HEART CATH AND CORONARY ANGIOGRAPHY N/A 04/29/2017   Procedure: LEFT HEART CATH AND CORONARY ANGIOGRAPHY;  Surgeon: Jason Dresser, MD;  Location: [place] CV LAB;  Service: Cardiovascular;  Laterality: N/A;  .  LEFT HEART CATHETERIZATION WITH CORONARY ANGIOGRAM N/A 09/11/2013   Procedure: LEFT HEART CATHETERIZATION WITH CORONARY ANGIOGRAM;  Surgeon: Jason Dresser, MD;  Location: [facility] CATH LAB;  Service: Cardiovascular;  Laterality: N/A;  . MITRAL VALVE REPAIR Right 03/25/2016   Procedure: MINIMALLY INVASIVE REOPERATION FOR MITRAL VALVE REPAIR  (MVR) with size 26 Sorin Memo 3D;  Surgeon: [name], MD;  Location: [place];  Service: Open Heart Surgery;  Laterality: Right;  . RIGHT KNEE ED COMPARTMENT REPLACEMENT  2004  . SYNOVECTOMY  07/22/2011   Procedure: SYNOVECTOMY;  Surgeon: [name];  Location: [place];  Service: Orthopedics;;  . TEE WITHOUT CARDIOVERSION N/A 01/23/2016   Procedure: TRANSESOPHAGEAL ECHOCARDIOGRAM (TEE);  Surgeon: Jason Dresser, MD;  Location: [place];  Service: Cardiovascular;  Laterality: N/A;  . TEE WITHOUT CARDIOVERSION N/A 03/25/2016   Procedure: TRANSESOPHAGEAL ECHOCARDIOGRAM (TEE);  Surgeon: [name], MD;  Location: [place];  Service: Open Heart Surgery;  Laterality: N/A;  . UMBILICAL HERNIA REPAIR  01-15-09   W/ GASTRIC BANDING PROCEDURE   Social History   Socioeconomic History  . Marital status: Married    Spouse name: Not on file  . Number of  children: 1  . Years of education: Not on file  . Highest education level: Not on file  Occupational History  . Contractor, semi-retired  Social Needs  . Financial resource strain: Not on file  . Food insecurity:    Worry: Not on file    Inability: Not on file  . Transportation needs:    Medical: Not on file    Non-medical: Not on file  Tobacco Use  . Smoking status: Never Smoker  . Smokeless tobacco: Never Used  Substance and Sexual Activity  . Alcohol use: Yes, liquor    Comment: OCCASIONAL  . Drug use: No  . Sexual activity: Not on file  Lifestyle  . Physical activity:    Days per week: Not on file    Minutes per session: Not on file  . Stress: Not on file  Relationships  . Social connections:    Talks on phone: Not on file    Gets together: Not on file    Attends religious service: Not on file    Active member of club or organization: Not on file    Attends meetings of clubs or organizations: Not on file    Relationship status: Not on file  . Intimate partner violence:    Fear of current or ex partner: Not on file    Emotionally abused: Not on file    Physically abused: Not on file    Forced sexual activity: Not on file  Other Topics Concern  . Not on file  Social History Narrative  . Not on file   Current Outpatient Medications on File Prior to Visit  Medication Sig Dispense Refill  . Alirocumab (PRALUENT) 150 MG/ML SOAJ Inject 1 pen into the skin every 14 (fourteen) days. 2 pen 11  . amoxicillin (AMOXIL) 500 MG tablet Take 4 tablets (2 g) by mouth 30-60 minutes prior to dental appointment 12 tablet 0  . apixaban (ELIQUIS) 5 MG TABS tablet Take 1 tablet (5 mg total) by mouth 2 (two) times daily. 180 tablet 3  . Artificial Saliva (MOUTH KOTE MT) Use as directed 4 sprays in the mouth or throat every 3 (three) hours as needed (for dry mouth).    . calcitRIOL (ROCALTROL) 0.25 MCG capsule Take 0.25 mcg by mouth daily.    Marland Kitchen  carvedilol (COREG) 3.125 MG tablet Take  3.125 mg by mouth 2 (two) times daily.    . cetirizine (ZYRTEC) 10 MG tablet Take 10 mg by mouth daily.     . fluticasone (FLONASE) 50 MCG/ACT nasal spray Place 1 spray into both nostrils daily as needed for allergies or rhinitis.    . furosemide (LASIX) 80 MG tablet Take 80 mg by mouth daily.     Marland Kitchen glipiZIDE (GLUCOTROL) 5 MG tablet Take 1 tablet (5 mg total) by mouth 2 (two) times daily before a meal. 180 tablet 3  . HYDROcodone-acetaminophen (NORCO/VICODIN) 5-325 MG tablet Take 0.5 tablets by mouth every 6 (six) hours as needed for moderate pain.    . Hypromellose (ARTIFICIAL TEARS OP) Apply 1 drop to eye 2 (two) times daily as needed (dry eyes).    . isosorbide mononitrate (IMDUR) 60 MG 24 hr tablet Take 1 tablet (60 mg total) by mouth daily. 90 tablet 3  . nitroGLYCERIN (NITROSTAT) 0.4 MG SL tablet PLACE 1 TABLET UNDER THE TONGUE EVERY 5 MINUTES AS NEEDED FOR CHEST PAIN UP TO 3 DOSES, IF SYMPTOMS PERSIST CALL 911 25 tablet 3  . potassium chloride SA (K-DUR,KLOR-CON) 20 MEQ tablet Take 20 mEq by mouth daily.    . traZODone (DESYREL) 100 MG tablet Take 100 mg by mouth at bedtime.     No current facility-administered medications on file prior to visit.    Allergies  Allergen Reactions  . Crestor [Rosuvastatin] Other (See Comments)    Muscle aches   . Lipitor [Atorvastatin] Other (See Comments)    Muscle aches  . Pravastatin Other (See Comments)    Muscle aches  . Codeine Itching and Other (See Comments)    Extremity tingling-- can take synthetic   Family History  Problem Relation Age of Onset  . Other Mother        joint problems  . Hypertension Father   . Heart disease Father   . CVA Father   . Depression Father   . Heart disease Sister        CABG  . Stroke Sister   . Hypertension Sister   . Congestive Heart Failure Sister   . Diabetes Sister   . Heart disease Brother   . Hypertension Brother   . Congestive Heart Failure Brother     PE: BP 120/82   Pulse 81   Ht 5'  8" (1.727 m) Comment: measured without shoes  Wt 203 lb (92.1 kg)   SpO2 98%   BMI 30.87 kg/m  Wt Readings from Last 3 Encounters:  05/23/19 203 lb (92.1 kg)  05/10/19 206 lb (93.4 kg)  02/16/19 204 lb 9.6 oz (92.8 kg)   Constitutional: overweight, in NAD Eyes: PERRLA, EOMI, no exophthalmos ENT: moist mucous membranes, no thyromegaly, no cervical lymphadenopathy Cardiovascular: RRR, No MRG Respiratory: CTA B Gastrointestinal: abdomen soft, NT, ND, BS+ Musculoskeletal: no deformities, strength intact in all 4 Skin: moist, warm, no rashes Neurological: no tremor with outstretched hands, DTR normal in all 4  ASSESSMENT: 1. DM2, non-insulin-dependent, uncontrolled, with long-term complications - CAD, s/p coronaroplasty, then CABG 1995 - cardiologist - Dr. Aundra Dubin - CHF, EF 6 to 65% - Atrial fibrillation, cardioversion in 2019 - Carotid artery disease - Cerebrovascular disease, s/p TIA - CKD stage V, pending dialysis -Dr. Jimmy [name] - ED  2. HL  3.  Obesity  PLAN:  1. Patient with longstanding, uncontrolled, type 2 diabetes, off metformin due to worsening kidney function.  Our first visit was  in 09/2018.  At that time, he was not on any diabetes medicines.  I suggested to start Ozempic, since unfortunately, we cannot use an SGLT 2 inhibitor for him due to his poor kidney function.   However, Ozempic was not approved by the [place] as apparently his HbA1c was not high enough.  We discussed at that time to start glipizide but ultimately, if a GLP-1 receptor agonist would not be approved, he needed to start insulin, which would be very unfortunate since insulin does not have any cardiovascular benefits.  At last visit, I also printed and gave him a copy of  the 2019 update to management of hyperglycemia in type 2 diabetes- a consensus report by the ADA and the EASD.  He tells me that this was finally approved for him and he is now on Ozempic for the last 1.5 months.  His sugars decreased but he  is not checking very consistently.  Reviewing his meter downloads, his sugars are still above target in the morning.  He feels that they are higher if he has a meal with more starches at night.  We discussed about adding a low dose of glipizide before dinner if he has a large dinner or if he has dessert.  Otherwise, I would suggest to continue the current dose of Ozempic for now.  At next visit, we may need to increase the GLP-1 receptor agonist dose. -At last visit, I advised him to stop pineapple juice and I also wanted to refer him to nutrition but he wanted to think about that. - I suggested to:  Patient Instructions  Please restart - Glipizide 5 mg before a larger dinner or if you have dessert  Please continue: - Ozempic 0.5 mg weekly   Please return in 4 months with your sugar log.   - we checked his HbA1c: 7.1% (much better) - advised to check sugars at different times of the day - 1x a day, rotating check times - advised for yearly eye exams >> he is UTD - return to clinic in 3-4 months   2. HL -Reviewed latest lipid panel from 01/2019: LDL above goal, triglycerides high, HDL low Lab Results  Component Value Date   CHOL 185 02/16/2019   HDL 36 (L) 02/16/2019   LDLCALC 110 (H) 02/16/2019   TRIG 194 (H) 02/16/2019   CHOLHDL 5.1 02/16/2019  -He had side effects from statins, and is currently on Praluent  3.  Obesity -Before last visit, he lost 16 pounds by cutting down portions -Ozempic will also help with weight loss  Philemon Kingdom, MD PhD [facility] Endocrinology

## 2019-05-23 NOTE — Addendum Note (Signed)
Addended by: Cardell Peach I on: 05/23/2019 11:30 AM   Modules accepted: Orders

## 2019-05-23 NOTE — Patient Instructions (Addendum)
Please restart - Glipizide 5 mg before a larger dinner or if you have dessert  Please continue: - Ozempic 0.5 mg weekly   Please return in 4 months with your sugar log.

## 2019-06-06 DIAGNOSIS — Z23 Encounter for immunization: Secondary | ICD-10-CM | POA: Diagnosis not present

## 2019-06-08 DIAGNOSIS — N189 Chronic kidney disease, unspecified: Secondary | ICD-10-CM | POA: Diagnosis not present

## 2019-06-08 DIAGNOSIS — M109 Gout, unspecified: Secondary | ICD-10-CM | POA: Diagnosis not present

## 2019-06-08 DIAGNOSIS — N185 Chronic kidney disease, stage 5: Secondary | ICD-10-CM | POA: Diagnosis not present

## 2019-06-21 DIAGNOSIS — D631 Anemia in chronic kidney disease: Secondary | ICD-10-CM | POA: Diagnosis not present

## 2019-06-21 DIAGNOSIS — Z9889 Other specified postprocedural states: Secondary | ICD-10-CM | POA: Diagnosis not present

## 2019-06-21 DIAGNOSIS — R319 Hematuria, unspecified: Secondary | ICD-10-CM | POA: Diagnosis not present

## 2019-06-21 DIAGNOSIS — E785 Hyperlipidemia, unspecified: Secondary | ICD-10-CM | POA: Diagnosis not present

## 2019-06-21 DIAGNOSIS — E1122 Type 2 diabetes mellitus with diabetic chronic kidney disease: Secondary | ICD-10-CM | POA: Diagnosis not present

## 2019-06-21 DIAGNOSIS — Z79899 Other long term (current) drug therapy: Secondary | ICD-10-CM | POA: Diagnosis not present

## 2019-06-21 DIAGNOSIS — N2581 Secondary hyperparathyroidism of renal origin: Secondary | ICD-10-CM | POA: Diagnosis not present

## 2019-06-21 DIAGNOSIS — I120 Hypertensive chronic kidney disease with stage 5 chronic kidney disease or end stage renal disease: Secondary | ICD-10-CM | POA: Diagnosis not present

## 2019-06-21 DIAGNOSIS — I4891 Unspecified atrial fibrillation: Secondary | ICD-10-CM | POA: Diagnosis not present

## 2019-06-21 DIAGNOSIS — N185 Chronic kidney disease, stage 5: Secondary | ICD-10-CM | POA: Diagnosis not present

## 2019-06-21 DIAGNOSIS — M109 Gout, unspecified: Secondary | ICD-10-CM | POA: Diagnosis not present

## 2019-06-21 DIAGNOSIS — I770 Arteriovenous fistula, acquired: Secondary | ICD-10-CM | POA: Diagnosis not present

## 2019-07-11 ENCOUNTER — Ambulatory Visit (HOSPITAL_COMMUNITY)
Admission: RE | Admit: 2019-07-11 | Discharge: 2019-07-11 | Disposition: A | Discharge status: Home / Self Care | Payer: Medicare Other | Source: Ambulatory Visit | Attending: Cardiology | Admitting: Cardiology

## 2019-07-11 ENCOUNTER — Encounter (HOSPITAL_COMMUNITY): Payer: Self-pay | Admitting: Cardiology

## 2019-07-11 ENCOUNTER — Other Ambulatory Visit: Payer: Self-pay

## 2019-07-11 VITALS — BP 114/68 | HR 81 | Wt 213.8 lb

## 2019-07-11 DIAGNOSIS — Z6832 Body mass index (BMI) 32.0-32.9, adult: Secondary | ICD-10-CM | POA: Insufficient documentation

## 2019-07-11 DIAGNOSIS — I25810 Atherosclerosis of coronary artery bypass graft(s) without angina pectoris: Secondary | ICD-10-CM | POA: Diagnosis not present

## 2019-07-11 DIAGNOSIS — E669 Obesity, unspecified: Secondary | ICD-10-CM | POA: Insufficient documentation

## 2019-07-11 DIAGNOSIS — N179 Acute kidney failure, unspecified: Secondary | ICD-10-CM | POA: Insufficient documentation

## 2019-07-11 DIAGNOSIS — I252 Old myocardial infarction: Secondary | ICD-10-CM | POA: Insufficient documentation

## 2019-07-11 DIAGNOSIS — Z951 Presence of aortocoronary bypass graft: Secondary | ICD-10-CM | POA: Insufficient documentation

## 2019-07-11 DIAGNOSIS — Z9884 Bariatric surgery status: Secondary | ICD-10-CM | POA: Diagnosis not present

## 2019-07-11 DIAGNOSIS — Z79899 Other long term (current) drug therapy: Secondary | ICD-10-CM | POA: Diagnosis not present

## 2019-07-11 DIAGNOSIS — Z8249 Family history of ischemic heart disease and other diseases of the circulatory system: Secondary | ICD-10-CM | POA: Insufficient documentation

## 2019-07-11 DIAGNOSIS — E785 Hyperlipidemia, unspecified: Secondary | ICD-10-CM | POA: Insufficient documentation

## 2019-07-11 DIAGNOSIS — I5032 Chronic diastolic (congestive) heart failure: Secondary | ICD-10-CM | POA: Insufficient documentation

## 2019-07-11 DIAGNOSIS — Z7901 Long term (current) use of anticoagulants: Secondary | ICD-10-CM | POA: Diagnosis not present

## 2019-07-11 DIAGNOSIS — G4733 Obstructive sleep apnea (adult) (pediatric): Secondary | ICD-10-CM | POA: Insufficient documentation

## 2019-07-11 DIAGNOSIS — E7800 Pure hypercholesterolemia, unspecified: Secondary | ICD-10-CM | POA: Diagnosis not present

## 2019-07-11 DIAGNOSIS — Z952 Presence of prosthetic heart valve: Secondary | ICD-10-CM | POA: Insufficient documentation

## 2019-07-11 DIAGNOSIS — I2510 Atherosclerotic heart disease of native coronary artery without angina pectoris: Secondary | ICD-10-CM | POA: Insufficient documentation

## 2019-07-11 DIAGNOSIS — E1122 Type 2 diabetes mellitus with diabetic chronic kidney disease: Secondary | ICD-10-CM | POA: Insufficient documentation

## 2019-07-11 DIAGNOSIS — N184 Chronic kidney disease, stage 4 (severe): Secondary | ICD-10-CM | POA: Insufficient documentation

## 2019-07-11 DIAGNOSIS — M109 Gout, unspecified: Secondary | ICD-10-CM | POA: Insufficient documentation

## 2019-07-11 DIAGNOSIS — I130 Hypertensive heart and chronic kidney disease with heart failure and stage 1 through stage 4 chronic kidney disease, or unspecified chronic kidney disease: Secondary | ICD-10-CM | POA: Insufficient documentation

## 2019-07-11 DIAGNOSIS — I480 Paroxysmal atrial fibrillation: Secondary | ICD-10-CM | POA: Diagnosis not present

## 2019-07-11 DIAGNOSIS — N529 Male erectile dysfunction, unspecified: Secondary | ICD-10-CM | POA: Diagnosis not present

## 2019-07-11 DIAGNOSIS — Z7984 Long term (current) use of oral hypoglycemic drugs: Secondary | ICD-10-CM | POA: Insufficient documentation

## 2019-07-11 DIAGNOSIS — Z8673 Personal history of transient ischemic attack (TIA), and cerebral infarction without residual deficits: Secondary | ICD-10-CM | POA: Insufficient documentation

## 2019-07-11 LAB — LIPID PANEL
Cholesterol: 149 mg/dL (ref 0–200)
HDL: 38 mg/dL — ABNORMAL LOW (ref >40)
LDL Cholesterol: 73 mg/dL (ref 0–99)
Total CHOL/HDL Ratio: 3.9 RATIO
Triglycerides: 188 mg/dL — ABNORMAL HIGH (ref <150)
VLDL: 38 mg/dL (ref 0–40)

## 2019-07-11 LAB — BASIC METABOLIC PANEL
Anion gap: 12 (ref 5–15)
BUN: 23 mg/dL (ref 8–23)
CO2: 25 mmol/L (ref 22–32)
Calcium: 9.6 mg/dL (ref 8.9–10.3)
Chloride: 96 mmol/L — ABNORMAL LOW (ref 98–111)
Creatinine, Ser: 3.44 mg/dL — ABNORMAL HIGH (ref 0.61–1.24)
GFR calc Af Amer: 19 mL/min — ABNORMAL LOW (ref >60)
GFR calc non Af Amer: 17 mL/min — ABNORMAL LOW (ref >60)
Glucose, Bld: 205 mg/dL — ABNORMAL HIGH (ref 70–99)
Potassium: 3.7 mmol/L (ref 3.5–5.1)
Sodium: 133 mmol/L — ABNORMAL LOW (ref 135–145)

## 2019-07-11 MED ORDER — SILDENAFIL CITRATE 50 MG PO TABS: 50.0000 mg | ORAL_TABLET | Freq: Every day | ORAL | 3 refills | Status: DC | PRN

## 2019-07-11 MED ORDER — CARVEDILOL 6.25 MG PO TABS: 3.2500 mg | ORAL_TABLET | Freq: Two times a day (BID) | ORAL | 5 refills | Status: DC

## 2019-07-11 NOTE — Patient Instructions (Signed)
STOP Imdur (Isosorbide)  INCREASE Coreg (carvedilol) to 6.25mg  (1 tab) twice a day  START Viagra 50mg  (1 tab) as needed. DO NOT TAKE NITROGLYCERIN with this medication.   EKG done today  Labs today We will only contact you if something comes back abnormal or we need to make some changes. Otherwise no news is good news!  Your physician has requested that you have an echocardiogram in December. Echocardiography is a painless test that uses sound waves to create images of your heart. It provides your doctor with information about the size and shape of your heart and how well your heart's chambers and valves are working. This procedure takes approximately one hour. There are no restrictions for this procedure.  Your physician recommends that you schedule a follow-up appointment in: 4 months with Dr Aundra Dubin.   At the [facility], you and your health needs are our priority. As part of our continuing mission to provide you with exceptional heart care, we have created designated Provider Care Teams. These Care Teams include your primary Cardiologist (physician) and Advanced Practice Providers (APPs- Physician Assistants and Nurse Practitioners) who all work together to provide you with the care you need, when you need it.   You may see any of the following providers on your designated Care Team at your next follow up: Marland Kitchen Dr Glori Bickers . Dr [name] . Darrick Grinder, NP . Lyda Jester, PA   Please be sure to bring in all your medications bottles to every appointment.

## 2019-07-12 NOTE — Progress Notes (Signed)
Patient ID: [name], male   DOB: 1946-07-26, 73 y.o.   MRN: [id number] PCP: Dr Harrington Challenger Cardiology: Dr. Aundra Dubin  73 y.o. with history of CAD s/p CABG and obesity s/p lap banding procedure presents for cardiology followup.  Given exertional dyspnea and chest tightness, he had LHC in 1/15.  This showed no change from prior.  Grafts were patent and there was severe stenosis in distal D1, not amenable to PCI.   In 2017, he developed increased exertional dyspnea.  Eventually had a TEE showing severe MR with prolapse of a portion of the anterior mitral valve leaflet.  Coronary angiography in 6/17 showed patent grafts and 90% stenosis of distal diagonal not amenable to intervention.  In 8/17, he had mitral valve repair.  Post-op diastolic CHF, Lasix started and increased.   He had a cath again in 9/18 showing stable coronary anatomy.    He developed atrial arrhythmias in 12/18.  Both fibrillation and flutter were seen.  Saw Dr. Rayann Heman, recommended DCCV with amiodarone or sotalol if fibrillation recurs.  He had DCCV in 1/19.   In 6/19, he was noted to have developed AKI. Creatinine went up to 3.46.  Renal US was done, showing normal kidneys. Cause of AKI was not clear.  We stopped his Lasix. He saw nephrology and eventually went back on Lasix, currently taking 80 mg daily.  He is not using any NSAIDs.    He stopped pravastatin due to myalgias. We struggled to get him on a PCSK9 inhibitor through the [place] but he was finally able to get Praluent.    I had him do a Lexiscan Cardiolite in 11/19, which showed old inferior MI with no ischemia.  Echo in 11/19 showed EF 55-60%, s/p MV repair with mean gradient 6 mmHg.     He returns today for followup of CAD and CHF.  He has been quite active recently, doing some small contracting work.  No chest pain.  No problems walking on flat ground, mild dyspnea walking up a hill.  He is fatigued at the end of the day.  Main concern today is erectile dysfunction.     ECG  (personally reviewed): NSR, IVCD  Labs (11/12): LDL 70 Labs (5/13): K 4.1, creatinine 1.1, BNP 48 Labs (12/13): K 3.4, creatinine 1.07, LDL 59, HDL 37 Labs (1/15): K 4.3, creatinine 1.2, HCT 40.3 Labs (3/15): LDL 38, HDL 81, BNP 55, K 4, creatinine 1.2 Labs (6/15): K 4.2, creatinine 1.4, LFTs normal, LDL 66, HDL 35 Labs (8/16): K 4, creatinine 1.6, BNP 64 Labs (12/16): LDL 61, HDL 37 Labs (4/17): K 4.2, creatinine 1.35, HCT 38.7, BNP 80 Labs (8/17): K 4.2, creatinine 1.4 => 1.66, BNP 159 Labs (12/17): K 3.6, creatinine 1.75 Labs (2/18): LDL 76, HDL 36 Labs (9/18): K 3.9, creatinine 1.76 Labs (1/19): K 4.9, creatinine 1.76, hgb 14 Labs (6/19): K 3.6, creatinine 3.46 Labs (7/19): K 4.1, creatinine 3.66, hgb 12.7 Labs (10/19): LDL 177, hgb 13.4, troponin negative, K 4, creatinine 4.15 Labs (12/19): K 4.3, creatinine 3.88 Labs (4/20): K 3.6, creatinine 3.39  PMH: 1. OSA: on CPAP.  2. Diabetes mellitus 3. Allergic rhinitis 4. CAD: s/p CABG 1995.  LHC (10/11) with 90% distal D1 (patent proximal D1 stent), total occlusion CFX, total occlusion RCA, no significant stenoses in LAD, SVG-PDA patent, SVG-OM patent, EF 55%.  Medical management.  LHC (5/13) with patent grafts and 90% distal D1 stenosis (no significant change from 10/11).  LHC (1/15) with patent grafts, 75%  ISR in D1 stent, 90% stenosis distal D1 (small caliber). D1 not amenable to intervention.  - Coronary angiography (6/17): grafts patent, 90% stenosis distally in large diagonal not amenable to intervention.   - LHC (9/18): SVG-OM and SVG-PDA patent,  75% in-stent restenosis in proximal diagonal then 90% stenosis distally (small in caliber at that point), totally occluded LCx and RCA.  Left coronary system similar to prior caths, D1 may be source of angina.  - Lexiscan Cardiolite (11/19): EF 47%, old inferior MI, no ischemia.  5. Obesity: lap-banding in 5/11.  6. Chronic diastolic CHF: Echo (1/61) with EF 55-60%, moderate LVH,  moderate diastolic dysfunction, s/p mitral valve repair with elevated mean gradient but normal pressure half-time.  - Echo (8/18): EF 55-60%, s/p MV repair with mean gradient 6 mmHg, PASP 28 mmHg, mildly decreased RV systolic function.  - Echo (11/19): EF 55-60%, s/p MV repair with mean gradient 6, PASP 42 mmHg, mildly dilated RV, dilated IVC.  7. TIA: Carotid dopplers in 11/11 with 0-39% bilateral stenosis.  Carotid dopplers 6/17 with 1-39% RICA stenosis.  8. OA: Knees, C-spine. TKR 9/13.  9. HTN 10. Hyperlipidemia: Myalgias with atorvastatin and Crestor. Now on Praluent.  11. Diverticulosis 12. MItral regurgitation: TEE (6/17) with EF 55-60%, severe eccentric MR, prolapse of a portion of the anterior leaflet, peak RV-RA gradient 45 mmHg.  - Mitral valve repair 8/17 13. CKD: Stage III.  14. Atrial fibrillation/atrial flutter: Both arrhythmias noted in 12/18.  DCCV 12/19.   15. AKI 6/19 => CKD stage 4: uncertain etiology.  Renal US was normal.  16. 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  Lives in [place].   Family History  Problem Relation Age of Onset  . Other Mother        joint problems  . Hypertension Father   . Heart disease Father   . CVA Father   . Depression Father   . Heart disease Sister        CABG  . Stroke Sister   . Hypertension Sister   . Congestive Heart Failure Sister   . Diabetes Sister   . Heart disease Brother   . Hypertension Brother   . Congestive Heart Failure Brother     ROS: All systems reviewed and negative except as per HPI.    Current Outpatient Medications  Medication Sig Dispense Refill  . Alirocumab (PRALUENT) 150 MG/ML SOAJ Inject 1 pen into the skin every 14 (fourteen) days. 2 pen 11  . amoxicillin (AMOXIL) 500 MG tablet Take 4 tablets (2 g) by mouth 30-60 minutes prior to dental appointment 12 tablet 0  . apixaban (ELIQUIS) 5 MG TABS tablet Take 1 tablet (5 mg total) by mouth 2 (two) times daily. 180 tablet 3   . Artificial Saliva (MOUTH KOTE MT) Use as directed 4 sprays in the mouth or throat every 3 (three) hours as needed (for dry mouth).    . calcitRIOL (ROCALTROL) 0.25 MCG capsule Take 0.25 mcg by mouth daily.    . carvedilol (COREG) 6.25 MG tablet Take 0.5 tablets (3.125 mg total) by mouth 2 (two) times daily. 60 tablet 5  . cetirizine (ZYRTEC) 10 MG tablet Take 10 mg by mouth daily.     . fluticasone (FLONASE) 50 MCG/ACT nasal spray Place 1 spray into both nostrils daily as needed for allergies or rhinitis.    . furosemide (LASIX) 40 MG tablet Take 40 mg by mouth daily.     Marland Kitchen glipiZIDE (  GLUCOTROL) 5 MG tablet Take 1 tablet (5 mg total) by mouth daily before supper. 90 tablet 3  . HYDROcodone-acetaminophen (NORCO/VICODIN) 5-325 MG tablet Take 0.5 tablets by mouth every 6 (six) hours as needed for moderate pain.    . Hypromellose (ARTIFICIAL TEARS OP) Apply 1 drop to eye 2 (two) times daily as needed (dry eyes).    . nitroGLYCERIN (NITROSTAT) 0.4 MG SL tablet PLACE 1 TABLET UNDER THE TONGUE EVERY 5 MINUTES AS NEEDED FOR CHEST PAIN UP TO 3 DOSES, IF SYMPTOMS PERSIST CALL 911 25 tablet 3  . potassium chloride SA (K-DUR,KLOR-CON) 20 MEQ tablet Take 20 mEq by mouth daily.    . Semaglutide,0.25 or 0.5MG /DOS, (OZEMPIC, 0.25 OR 0.5 MG/DOSE,) 2 MG/1.5ML SOPN Inject 0.5 mg into the skin once a week. 3 pen 3  . traZODone (DESYREL) 100 MG tablet Take 100 mg by mouth at bedtime.    Marland Kitchen allopurinol (ZYLOPRIM) 100 MG tablet     . colchicine 0.6 MG tablet Take 0.6 mg by mouth daily.    . sildenafil (VIAGRA) 50 MG tablet Take 1 tablet (50 mg total) by mouth daily as needed for erectile dysfunction. Do not take nitroglycerin with this medication 10 tablet 3   No current facility-administered medications for this encounter.     BP 114/68   Pulse 81   Wt 97 kg (213 lb 12.8 oz)   SpO2 99%   BMI 32.51 kg/m  General: NAD Neck: No JVD, no thyromegaly or thyroid nodule.  Lungs: Clear to auscultation bilaterally  with normal respiratory effort. CV: Nondisplaced PMI.  Heart regular S1/S2, no S3/S4, no murmur.  No peripheral edema.  No carotid bruit.  Normal pedal pulses.  Abdomen: Soft, nontender, no hepatosplenomegaly, no distention.  Skin: Intact without lesions or rashes.  Neurologic: Alert and oriented x 3.  Psych: Normal affect. Extremities: No clubbing or cyanosis.  HEENT: Normal.   Assessment/Plan: 1. S/p mitral valve repair:  Echo 11/19 showed stable mitral valve repair compared to prior echo with no MR, mean gradient 6 mmHg.   - With prosthetic material, should use antibiotic prophylaxis with dental work.  2. CAD:  He has severe stenosis in a distal D1 that is not amenable to intervention.  This has been stable on last couple of caths. He has occluded RCA and LCx known from the past with SVGs to OM and PDA.  EF remains preserved by echo. Cardiolite in 11/19 showed no ischemia. No further chest pain.  - No ASA with apixaban use.  - With no recent chest pain and desire to use Viagra, I will have him stop Imdur.  He knows not to use nitrates around the time of Viagra use.  - Increase Coerg to 6.25 mg bid since he is stopping Imdur.  - He cannot tolerate statins with myalgias, Now on Praluent.    3. Hyperlipidemia: Myalgias with Crestor, pravastatin, and atorvastatin.  We were finally able to get him Praluent through the [place].  - Check lipids today.   4. Chronic diastolic CHF:  NYHA class II symptoms (no dyspnea but prominent fatigue).  He does not look volume overloaded.  - Continue Lasix 40 mg daily.   - BMET today.  - I will arrange for repeat echo to follow mitral valve and LV function.  5. OSA: Continue CPAP.  6. Atrial fibrillation: Paroxysmal, in NSR after DCCV in 1/19.  If recurs, may need amiodarone to maintain NSR.  - Continue apixaban 5 mg bid.  7. CKD  stage 4: AKI without recovery, uncertain etiology.  He sees nephrology, there is concern that he is progressing towards HD. He now has  AV fistula.  - Check BMET today.  8. Diabetes: He is now followed by endocrinology.   9. Erectile dysfunction: He is going to stop Imdur.  I will let him try Viagra 50 mg prn.  He was told not to use nitrates (Imdur, NTG) with Viagra use.   Followup 4 mos  [name] 07/12/2019

## 2019-07-16 DIAGNOSIS — S93401A Sprain of unspecified ligament of right ankle, initial encounter: Secondary | ICD-10-CM | POA: Diagnosis not present

## 2019-08-07 ENCOUNTER — Other Ambulatory Visit: Payer: Self-pay

## 2019-08-07 ENCOUNTER — Ambulatory Visit (HOSPITAL_COMMUNITY)
Admission: RE | Admit: 2019-08-07 | Discharge: 2019-08-07 | Disposition: A | Discharge status: Home / Self Care | Payer: Medicare Other | Source: Ambulatory Visit | Attending: Cardiology | Admitting: Cardiology

## 2019-08-07 DIAGNOSIS — I110 Hypertensive heart disease with heart failure: Secondary | ICD-10-CM | POA: Diagnosis not present

## 2019-08-07 DIAGNOSIS — I5032 Chronic diastolic (congestive) heart failure: Secondary | ICD-10-CM

## 2019-08-07 DIAGNOSIS — I081 Rheumatic disorders of both mitral and tricuspid valves: Secondary | ICD-10-CM | POA: Insufficient documentation

## 2019-08-07 DIAGNOSIS — E119 Type 2 diabetes mellitus without complications: Secondary | ICD-10-CM | POA: Insufficient documentation

## 2019-08-07 DIAGNOSIS — I252 Old myocardial infarction: Secondary | ICD-10-CM | POA: Insufficient documentation

## 2019-08-07 DIAGNOSIS — E785 Hyperlipidemia, unspecified: Secondary | ICD-10-CM | POA: Diagnosis not present

## 2019-08-07 NOTE — Progress Notes (Signed)
  Echocardiogram 2D Echocardiogram has been performed.  [name] 08/07/2019, 3:09 PM

## 2019-09-05 DIAGNOSIS — M19079 Primary osteoarthritis, unspecified ankle and foot: Secondary | ICD-10-CM | POA: Insufficient documentation

## 2019-09-07 ENCOUNTER — Encounter (HOSPITAL_COMMUNITY): Payer: Self-pay | Admitting: Nurse Practitioner

## 2019-09-07 ENCOUNTER — Other Ambulatory Visit: Payer: Self-pay

## 2019-09-07 ENCOUNTER — Ambulatory Visit (HOSPITAL_COMMUNITY)
Admission: RE | Admit: 2019-09-07 | Discharge: 2019-09-07 | Disposition: A | Discharge status: Home / Self Care | Payer: Medicare Other | Source: Ambulatory Visit | Attending: Nurse Practitioner | Admitting: Nurse Practitioner

## 2019-09-07 VITALS — BP 124/76 | HR 83 | Ht 68.0 in | Wt 210.8 lb

## 2019-09-07 DIAGNOSIS — E785 Hyperlipidemia, unspecified: Secondary | ICD-10-CM | POA: Diagnosis not present

## 2019-09-07 DIAGNOSIS — F4310 Post-traumatic stress disorder, unspecified: Secondary | ICD-10-CM | POA: Insufficient documentation

## 2019-09-07 DIAGNOSIS — Z7901 Long term (current) use of anticoagulants: Secondary | ICD-10-CM | POA: Diagnosis not present

## 2019-09-07 DIAGNOSIS — Z951 Presence of aortocoronary bypass graft: Secondary | ICD-10-CM | POA: Diagnosis not present

## 2019-09-07 DIAGNOSIS — I480 Paroxysmal atrial fibrillation: Secondary | ICD-10-CM | POA: Diagnosis not present

## 2019-09-07 DIAGNOSIS — E669 Obesity, unspecified: Secondary | ICD-10-CM | POA: Insufficient documentation

## 2019-09-07 DIAGNOSIS — I447 Left bundle-branch block, unspecified: Secondary | ICD-10-CM | POA: Insufficient documentation

## 2019-09-07 DIAGNOSIS — Z7984 Long term (current) use of oral hypoglycemic drugs: Secondary | ICD-10-CM | POA: Insufficient documentation

## 2019-09-07 DIAGNOSIS — Z9884 Bariatric surgery status: Secondary | ICD-10-CM | POA: Insufficient documentation

## 2019-09-07 DIAGNOSIS — I2510 Atherosclerotic heart disease of native coronary artery without angina pectoris: Secondary | ICD-10-CM | POA: Insufficient documentation

## 2019-09-07 DIAGNOSIS — Z955 Presence of coronary angioplasty implant and graft: Secondary | ICD-10-CM | POA: Insufficient documentation

## 2019-09-07 DIAGNOSIS — N189 Chronic kidney disease, unspecified: Secondary | ICD-10-CM | POA: Insufficient documentation

## 2019-09-07 DIAGNOSIS — Z79899 Other long term (current) drug therapy: Secondary | ICD-10-CM | POA: Insufficient documentation

## 2019-09-07 DIAGNOSIS — M1990 Unspecified osteoarthritis, unspecified site: Secondary | ICD-10-CM | POA: Insufficient documentation

## 2019-09-07 DIAGNOSIS — I252 Old myocardial infarction: Secondary | ICD-10-CM | POA: Diagnosis not present

## 2019-09-07 DIAGNOSIS — F419 Anxiety disorder, unspecified: Secondary | ICD-10-CM | POA: Insufficient documentation

## 2019-09-07 DIAGNOSIS — D6869 Other thrombophilia: Secondary | ICD-10-CM

## 2019-09-07 DIAGNOSIS — Z85828 Personal history of other malignant neoplasm of skin: Secondary | ICD-10-CM | POA: Insufficient documentation

## 2019-09-07 DIAGNOSIS — Z8673 Personal history of transient ischemic attack (TIA), and cerebral infarction without residual deficits: Secondary | ICD-10-CM | POA: Insufficient documentation

## 2019-09-07 DIAGNOSIS — I5032 Chronic diastolic (congestive) heart failure: Secondary | ICD-10-CM | POA: Diagnosis not present

## 2019-09-07 NOTE — Progress Notes (Signed)
Primary Care Physician: [name], MD Referring Physician: Thayer Dallas Cardiologist: Dr. Shelba Flake is a 74 y.o. male with a h/o CKD, CAD s/p CABG and obesity s/p lap banding. In 2017, he developed increased exertional dyspnea.  Eventually had a TEE showing severe MR with prolapse of a portion of the anterior mitral valve leaflet.  Coronary angiography in 6/17 showed patent grafts and 90% stenosis of distal diagonal not amenable to intervention.  In 8/17, he had mitral valve repair.  Post-op diastolic CHF, Lasix started and increased.   He developed atrial arrhythmias in 12/18.  Both a. fibrillation and flutter were seen. Saw Dr. Rayann Heman, recommended DCCV,   amiodarone or sotalol if fibrillation recurred shortly after cardioversion.  He had DCCV in 1/19 and  has been in [place] for the last 2 years.Marland Kitchen   He is now in afib clinic as he was seen in the [place] yesterday and was noted to be in rate controlled afib. Was referred here. He continues in rate controlled afib today. He was unaware that he was out of rhythm, but now looking back, he feels that he has had fatigue for around 2 weeks.He does not know of an trigger other than  has had some issues with gout. No  missed anticoagulation for at least 3 weeks with a CHA2DS2VASc score of at least 5.  Today, he denies symptoms of palpitations, chest pain, shortness of breath, orthopnea, PND, lower extremity edema, dizziness, presyncope, syncope, or neurologic sequela.+ fatigue. The patient is tolerating medications without difficulties and is otherwise without complaint today.   Past Medical History:  Diagnosis Date  . Allergic rhinitis   . Anxiety   . Atrial fibrillation ([place])   . Cancer ([place])    hx of skin cancers   . Chronic kidney disease   . Coronary artery disease    a. s/p MI & CABG; b. s/p PCI Diag;  c. Cath 12/2011: LAD diff dzs/small, Diag 75% isr, 90 dist to stent (small), LCX & RCA occluded, VG->OM 40, VG->PDA patent -  med rx.  . Depression    PTSD  . Diabetes mellitus    not on medications  . Diastolic CHF, chronic (HCC)    a. EF 55-60% by echo 2007  . Dysrhythmia    "abnormal beat"  . GERD (gastroesophageal reflux disease)    "not anymore" " I had a lap band"  . History of diverticulitis of colon    5 YRS AGO  . History of pneumonia    2017  . History of transient ischemic attack (TIA)    CAROTID DOPPLERS NOV 2011  0-39& BIL. STENOSIS  . Hyperlipidemia   . Hypertension   . Mitral regurgitation   . Myocardial infarction ([place])   . Neuromuscular disorder ([place])    left arm numbness   . 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 PTSD (post-traumatic stress disorder)    from [place]  . 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 Shortness of breath dyspnea    with exertion  . Sleep apnea    cpap- see ov [facility] 05/12/11 for settings    Past Surgical History:  Procedure Laterality Date  . AV FISTULA PLACEMENT Left 08/02/2018   Procedure:  ARTERIOVENOUS (AV) FISTULA CREATION RADIOCEPHALIC;  Surgeon: Angelia Mould, MD;  Location: [facility];  Service: Vascular;  Laterality: Left;  . BLEPHAROPLASTY  1985   BILATERAL  . CARDIAC CATHETERIZATION  2001, 2004, 2009, 2011  . CARDIAC CATHETERIZATION N/A 01/23/2016   Procedure: Right/Left Heart Cath and Coronary Angiography;  Surgeon: [name], MD;  Location: [facility] CV LAB;  Service: Cardiovascular;  Laterality: N/A;  . CARDIOVERSION N/A 09/07/2017   Procedure: CARDIOVERSION;  Surgeon: [name], MD;  Location: [facility] ENDOSCOPY;  Service: Cardiovascular;  Laterality: N/A;  . CATARACT EXTRACTION W/ INTRAOCULAR LENS IMPLANT Bilateral   . CHONDROPLASTY  07/22/2011   Procedure: CHONDROPLASTY;   Surgeon: Dione Plover Aluisio;  Location: [facility];  Service: Orthopedics;;  . CIRCUMCISION  35 YRS  AGO  . COLONOSCOPY    . CORONARY ANGIOPLASTY  1996-- POST CABG   W/ STENT, last cath 01/07/2012   . CORONARY ARTERY BYPASS GRAFT  1995   X3 VESSEL  . CORONARY STENT PLACEMENT    . KNEE ARTHROSCOPY  07/22/2011   Procedure: ARTHROSCOPY KNEE;  Surgeon: [name];  Location: [facility];  Service: Orthopedics;  Laterality: Right;  WITH DEBRIDEMENT   . KNEE ARTHROSCOPY W/ MENISCECTOMY  X2 IN 2002-- RIGHT KNEE  . LAPAROSCOPIC GASTRIC BANDING  01-15-09  . LEFT HEART CATH AND CORONARY ANGIOGRAPHY N/A 04/29/2017   Procedure: LEFT HEART CATH AND CORONARY ANGIOGRAPHY;  Surgeon: [name], MD;  Location: [facility] CV LAB;  Service: Cardiovascular;  Laterality: N/A;  . LEFT HEART CATHETERIZATION WITH CORONARY ANGIOGRAM N/A 09/11/2013   Procedure: LEFT HEART CATHETERIZATION WITH CORONARY ANGIOGRAM;  Surgeon: [name], MD;  Location: [facility] CATH LAB;  Service: Cardiovascular;  Laterality: N/A;  . MITRAL VALVE REPAIR Right 03/25/2016   Procedure: MINIMALLY INVASIVE REOPERATION FOR MITRAL VALVE REPAIR  (MVR) with size 26 Sorin Memo 3D;  Surgeon: [name], MD;  Location: [facility];  Service: Open Heart Surgery;  Laterality: Right;  . RIGHT KNEE ED COMPARTMENT REPLACEMENT  2004  . SYNOVECTOMY  07/22/2011   Procedure: SYNOVECTOMY;  Surgeon: [name];  Location: [facility];  Service: Orthopedics;;  . TEE WITHOUT CARDIOVERSION N/A 01/23/2016   Procedure: TRANSESOPHAGEAL ECHOCARDIOGRAM (TEE);  Surgeon: [name], MD;  Location: [facility];  Service: Cardiovascular;  Laterality: N/A;  . TEE WITHOUT CARDIOVERSION N/A 03/25/2016   Procedure: TRANSESOPHAGEAL ECHOCARDIOGRAM (TEE);  Surgeon: [name], MD;  Location: [facility];  Service: Open Heart Surgery;  Laterality: N/A;  . UMBILICAL HERNIA REPAIR  01-15-09   W/ GASTRIC BANDING PROCEDURE     Current Outpatient Medications  Medication Sig Dispense Refill  . Alirocumab (PRALUENT) 150 MG/ML SOAJ Inject 1 pen into the skin every 14 (fourteen) days. 2 pen 11  . allopurinol (ZYLOPRIM) 100 MG tablet daily. Taking one tablet daily    . amoxicillin (AMOXIL) 500 MG tablet Take 4 tablets (2 g) by mouth 30-60 minutes prior to dental appointment 12 tablet 0  . apixaban (ELIQUIS) 5 MG TABS tablet Take 1 tablet (5 mg total) by mouth 2 (two) times daily. 180 tablet 3  . Artificial Saliva (MOUTH KOTE MT) Use as directed 4 sprays in the mouth or throat every 3 (three) hours as needed (for dry mouth).    . calcitRIOL (ROCALTROL) 0.25 MCG capsule Take 0.25 mcg by mouth daily.    . carvedilol (COREG) 6.25 MG tablet Take 6.25 mg by mouth daily.    Marland Kitchen  cetirizine (ZYRTEC) 10 MG tablet Take 10 mg by mouth daily.     . colchicine 0.6 MG tablet Take 0.6 mg by mouth as needed.     . fluticasone (FLONASE) 50 MCG/ACT nasal spray Place 1 spray into both nostrils daily as needed for allergies or rhinitis.    . furosemide (LASIX) 40 MG tablet Take by mouth. Taking two tablets in the am    . glipiZIDE (GLUCOTROL) 5 MG tablet Take 1 tablet (5 mg total) by mouth daily before supper. 90 tablet 3  . HYDROcodone-acetaminophen (NORCO/VICODIN) 5-325 MG tablet Take 0.5 tablets by mouth as needed for moderate pain.     . Hypromellose (ARTIFICIAL TEARS OP) Apply 1 drop to eye 2 (two) times daily as needed (dry eyes).    . nitroGLYCERIN (NITROSTAT) 0.4 MG SL tablet PLACE 1 TABLET UNDER THE TONGUE EVERY 5 MINUTES AS NEEDED FOR CHEST PAIN UP TO 3 DOSES, IF SYMPTOMS PERSIST CALL 911 25 tablet 3  . pantoprazole (PROTONIX) 40 MG tablet Take 40 mg by mouth as needed.    . potassium chloride SA (K-DUR,KLOR-CON) 20 MEQ tablet Take 20 mEq by mouth daily.    . Semaglutide,0.25 or 0.5MG /DOS, (OZEMPIC, 0.25 OR 0.5 MG/DOSE,) 2 MG/1.5ML SOPN Inject 0.5 mg into the skin once a week. 3 pen 3  . sildenafil (VIAGRA) 50 MG tablet Take 1  tablet (50 mg total) by mouth daily as needed for erectile dysfunction. Do not take nitroglycerin with this medication (Patient taking differently: Take 50 mg by mouth as needed for erectile dysfunction. Do not take nitroglycerin with this medication) 10 tablet 3  . tadalafil (CIALIS) 20 MG tablet as needed.     . testosterone cypionate (DEPOTESTOSTERONE CYPIONATE) 200 MG/ML injection Takes every week    . traZODone (DESYREL) 100 MG tablet Take 100 mg by mouth at bedtime.    . isosorbide mononitrate (IMDUR) 60 MG 24 hr tablet He is not taking at this time     No current facility-administered medications for this encounter.    Allergies  Allergen Reactions  . Crestor [Rosuvastatin] Other (See Comments)    Muscle aches   . Lipitor [Atorvastatin] Other (See Comments)    Muscle aches  . Pravastatin Other (See Comments)    Muscle aches  . Codeine Itching and Other (See Comments)    Extremity tingling-- can take synthetic    Social History   Socioeconomic History  . Marital status: Married    Spouse name: Not on file  . Number of children: Not on file  . Years of education: Not on file  . Highest education level: Not on file  Occupational History  . Not on file  Tobacco Use  . Smoking status: Never Smoker  . Smokeless tobacco: Never Used  Substance and Sexual Activity  . Alcohol use: Yes    Comment: OCCASIONAL  . Drug use: No  . Sexual activity: Not on file  Other Topics Concern  . Not on file  Social History Narrative  . Not on file   Social Determinants of Health   Financial Resource Strain:   . Difficulty of Paying Living Expenses: Not on file  Food Insecurity:   . Worried About Charity fundraiser in the Last Year: Not on file  . Ran Out of Food in the Last Year: Not on file  Transportation Needs:   . Lack of Transportation (Medical): Not on file  . Lack of Transportation (Non-Medical): Not on file  Physical Activity:   .  Days of Exercise per Week: Not on file   . Minutes of Exercise per Session: Not on file  Stress:   . Feeling of Stress : Not on file  Social Connections:   . Frequency of Communication with Friends and Family: Not on file  . Frequency of Social Gatherings with Friends and Family: Not on file  . Attends Religious Services: Not on file  . Active Member of Clubs or Organizations: Not on file  . Attends Archivist Meetings: Not on file  . Marital Status: Not on file  Intimate Partner Violence:   . Fear of Current or Ex-Partner: Not on file  . Emotionally Abused: Not on file  . Physically Abused: Not on file  . Sexually Abused: Not on file    Family History  Problem Relation Age of Onset  . Other Mother        joint problems  . Hypertension Father   . Heart disease Father   . CVA Father   . Depression Father   . Heart disease Sister        CABG  . Stroke Sister   . Hypertension Sister   . Congestive Heart Failure Sister   . Diabetes Sister   . Heart disease Brother   . Hypertension Brother   . Congestive Heart Failure Brother     ROS- All systems are reviewed and negative except as per the HPI above  Physical Exam: Vitals:   09/07/19 1517  BP: 124/76  Pulse: 83  Weight: 95.6 kg  Height: 5\' 8"  (1.727 m)   Wt Readings from Last 3 Encounters:  09/07/19 95.6 kg  07/11/19 97 kg  05/23/19 92.1 kg    Labs: Lab Results  Component Value Date   NA 133 (L) 07/11/2019   K 3.7 07/11/2019   CL 96 (L) 07/11/2019   CO2 25 07/11/2019   GLUCOSE 205 (H) 07/11/2019   BUN 23 07/11/2019   CREATININE 3.44 (H) 07/11/2019   CALCIUM 9.6 07/11/2019   MG 2.2 03/26/2016   Lab Results  Component Value Date   INR 1.13 08/02/2018   Lab Results  Component Value Date   CHOL 149 07/11/2019   HDL 38 (L) 07/11/2019   LDLCALC 73 07/11/2019   TRIG 188 (H) 07/11/2019     GEN- The patient is well appearing, alert and oriented x 3 today.   Head- normocephalic, atraumatic Eyes-  Sclera clear, conjunctiva  pink Ears- hearing intact Oropharynx- clear Neck- supple, no JVP Lymph- no cervical lymphadenopathy Lungs- Clear to ausculation bilaterally, normal work of breathing Heart- irregular rate and rhythm, no murmurs, rubs or gallops, PMI not laterally displaced GI- soft, NT, ND, + BS Extremities- no clubbing, cyanosis, or edema MS- no significant deformity or atrophy Skin- no rash or lesion Psych- euthymic mood, full affect Neuro- strength and sensation are intact  EKG-afib at 83 bpm, with ILBBB EKG from  [place] reviewed from yesterday as well, controlled afib   Assessment and Plan: 1. Paroxysmal afib with CVR ? present for the last 2 weeks  Continue coreg 6.25 mg qd  No known trigger  Will plan for cardioversion next Wednesday with Dr. Aundra Dubin  Cbc/bmet/Covid today   2. CHA2DS2VASc score of 5 Denies any missed doses of anticoagulation x the last 3 weeks  Reminded not to miss any while waiting on DCCV  3. CHF  Weight stable   F/u here in one week after cardioversion  Butch Penny C. Marvine Encalade, [place]  [facility] [address] [place], [place] [address] [phone]

## 2019-09-07 NOTE — Patient Instructions (Signed)
Cardioversion scheduled for January. 20th 2021  - Arrive at the [facility] and go to admitting at 11:00am  -Do not eat or drink anything after midnight the night prior to your procedure.  - Take all your morning medications with a sip of water prior to arrival, and hold off on the diabetes medications until you get home.  - You will not be able to drive home after your procedure.

## 2019-09-07 NOTE — H&P (View-Only) (Signed)
Primary Care Physician: [name], MD Referring Physician: Thayer [place] Cardiologist: Dr. Shelba Flake is a 74 y.o. male with a h/o CKD, CAD s/p CABG and obesity s/p lap banding. In 2017, he developed increased exertional dyspnea.  Eventually had a TEE showing severe MR with prolapse of a portion of the anterior mitral valve leaflet.  Coronary angiography in 6/17 showed patent grafts and 90% stenosis of distal diagonal not amenable to intervention.  In 8/17, he had mitral valve repair.  Post-op diastolic CHF, Lasix started and increased.   He developed atrial arrhythmias in 12/18.  Both a. fibrillation and flutter were seen. Saw Dr. Rayann Heman, recommended DCCV,   amiodarone or sotalol if fibrillation recurred shortly after cardioversion.  He had DCCV in 1/19 and  has been in [place] for the last 2 years.Marland Kitchen   He is now in afib clinic as he was seen in the [place] yesterday and was noted to be in rate controlled afib. Was referred here. He continues in rate controlled afib today. He was unaware that he was out of rhythm, but now looking back, he feels that he has had fatigue for around 2 weeks.He does not know of an trigger other than  has had some issues with gout. No  missed anticoagulation for at least 3 weeks with a CHA2DS2VASc score of at least 5.  Today, he denies symptoms of palpitations, chest pain, shortness of breath, orthopnea, PND, lower extremity edema, dizziness, presyncope, syncope, or neurologic sequela.+ fatigue. The patient is tolerating medications without difficulties and is otherwise without complaint today.   Past Medical History:  Diagnosis Date  . Allergic rhinitis   . Anxiety   . Atrial fibrillation ([place])   . Cancer ([place])    hx of skin cancers   . Chronic kidney disease   . Coronary artery disease    a. s/p MI & CABG; b. s/p PCI Diag;  c. Cath 12/2011: LAD diff dzs/small, Diag 75% isr, 90 dist to stent (small), LCX & RCA occluded, VG->OM 40, VG->PDA patent -  med rx.  . Depression    PTSD  . Diabetes mellitus    not on medications  . Diastolic CHF, chronic (HCC)    a. EF 55-60% by echo 2007  . Dysrhythmia    "abnormal beat"  . GERD (gastroesophageal reflux disease)    "not anymore" " I had a lap band"  . History of diverticulitis of colon    5 YRS AGO  . History of pneumonia    2017  . History of transient ischemic attack (TIA)    CAROTID DOPPLERS NOV 2011  0-39& BIL. STENOSIS  . Hyperlipidemia   . Hypertension   . Mitral regurgitation   . Myocardial infarction ([place])   . Neuromuscular disorder ([place])    left arm numbness   . 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 PTSD (post-traumatic stress disorder)    from [place]  . 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 Shortness of breath dyspnea    with exertion  . Sleep apnea    cpap- see ov note in [facility] 05/12/11 for settings    Past Surgical History:  Procedure Laterality Date  . AV FISTULA PLACEMENT Left 08/02/2018   Procedure:  ARTERIOVENOUS (AV) FISTULA CREATION RADIOCEPHALIC;  Surgeon: Angelia Mould, MD;  Location: [name];  Service: Vascular;  Laterality: Left;  . BLEPHAROPLASTY  1985   BILATERAL  . CARDIAC CATHETERIZATION  2001, 2004, 2009, 2011  . CARDIAC CATHETERIZATION N/A 01/23/2016   Procedure: Right/Left Heart Cath and Coronary Angiography;  Surgeon: [name], MD;  Location: [place] CV LAB;  Service: Cardiovascular;  Laterality: N/A;  . CARDIOVERSION N/A 09/07/2017   Procedure: CARDIOVERSION;  Surgeon: [name], MD;  Location: [facility] ENDOSCOPY;  Service: Cardiovascular;  Laterality: N/A;  . CATARACT EXTRACTION W/ INTRAOCULAR LENS IMPLANT Bilateral   . CHONDROPLASTY  07/22/2011   Procedure: CHONDROPLASTY;   Surgeon: Dione Plover Aluisio;  Location: [place];  Service: Orthopedics;;  . CIRCUMCISION  35 YRS  AGO  . COLONOSCOPY    . CORONARY ANGIOPLASTY  1996-- POST CABG   W/ STENT, last cath 01/07/2012   . CORONARY ARTERY BYPASS GRAFT  1995   X3 VESSEL  . CORONARY STENT PLACEMENT    . KNEE ARTHROSCOPY  07/22/2011   Procedure: ARTHROSCOPY KNEE;  Surgeon: [name];  Location: [place];  Service: Orthopedics;  Laterality: Right;  WITH DEBRIDEMENT   . KNEE ARTHROSCOPY W/ MENISCECTOMY  X2 IN 2002-- RIGHT KNEE  . LAPAROSCOPIC GASTRIC BANDING  01-15-09  . LEFT HEART CATH AND CORONARY ANGIOGRAPHY N/A 04/29/2017   Procedure: LEFT HEART CATH AND CORONARY ANGIOGRAPHY;  Surgeon: [name], MD;  Location: [place] CV LAB;  Service: Cardiovascular;  Laterality: N/A;  . LEFT HEART CATHETERIZATION WITH CORONARY ANGIOGRAM N/A 09/11/2013   Procedure: LEFT HEART CATHETERIZATION WITH CORONARY ANGIOGRAM;  Surgeon: [name], MD;  Location: [facility] CATH LAB;  Service: Cardiovascular;  Laterality: N/A;  . MITRAL VALVE REPAIR Right 03/25/2016   Procedure: MINIMALLY INVASIVE REOPERATION FOR MITRAL VALVE REPAIR  (MVR) with size 26 Sorin Memo 3D;  Surgeon: [name], MD;  Location: [place];  Service: Open Heart Surgery;  Laterality: Right;  . RIGHT KNEE ED COMPARTMENT REPLACEMENT  2004  . SYNOVECTOMY  07/22/2011   Procedure: SYNOVECTOMY;  Surgeon: [name];  Location: [place];  Service: Orthopedics;;  . TEE WITHOUT CARDIOVERSION N/A 01/23/2016   Procedure: TRANSESOPHAGEAL ECHOCARDIOGRAM (TEE);  Surgeon: [name], MD;  Location: [place];  Service: Cardiovascular;  Laterality: N/A;  . TEE WITHOUT CARDIOVERSION N/A 03/25/2016   Procedure: TRANSESOPHAGEAL ECHOCARDIOGRAM (TEE);  Surgeon: [name], MD;  Location: [place];  Service: Open Heart Surgery;  Laterality: N/A;  . UMBILICAL HERNIA REPAIR  01-15-09   W/ GASTRIC BANDING PROCEDURE     Current Outpatient Medications  Medication Sig Dispense Refill  . Alirocumab (PRALUENT) 150 MG/ML SOAJ Inject 1 pen into the skin every 14 (fourteen) days. 2 pen 11  . allopurinol (ZYLOPRIM) 100 MG tablet daily. Taking one tablet daily    . amoxicillin (AMOXIL) 500 MG tablet Take 4 tablets (2 g) by mouth 30-60 minutes prior to dental appointment 12 tablet 0  . apixaban (ELIQUIS) 5 MG TABS tablet Take 1 tablet (5 mg total) by mouth 2 (two) times daily. 180 tablet 3  . Artificial Saliva (MOUTH KOTE MT) Use as directed 4 sprays in the mouth or throat every 3 (three) hours as needed (for dry mouth).    . calcitRIOL (ROCALTROL) 0.25 MCG capsule Take 0.25 mcg by mouth daily.    . carvedilol (COREG) 6.25 MG tablet Take 6.25 mg by mouth daily.    Marland Kitchen  cetirizine (ZYRTEC) 10 MG tablet Take 10 mg by mouth daily.     . colchicine 0.6 MG tablet Take 0.6 mg by mouth as needed.     . fluticasone (FLONASE) 50 MCG/ACT nasal spray Place 1 spray into both nostrils daily as needed for allergies or rhinitis.    . furosemide (LASIX) 40 MG tablet Take by mouth. Taking two tablets in the am    . glipiZIDE (GLUCOTROL) 5 MG tablet Take 1 tablet (5 mg total) by mouth daily before supper. 90 tablet 3  . HYDROcodone-acetaminophen (NORCO/VICODIN) 5-325 MG tablet Take 0.5 tablets by mouth as needed for moderate pain.     . Hypromellose (ARTIFICIAL TEARS OP) Apply 1 drop to eye 2 (two) times daily as needed (dry eyes).    . nitroGLYCERIN (NITROSTAT) 0.4 MG SL tablet PLACE 1 TABLET UNDER THE TONGUE EVERY 5 MINUTES AS NEEDED FOR CHEST PAIN UP TO 3 DOSES, IF SYMPTOMS PERSIST CALL 911 25 tablet 3  . pantoprazole (PROTONIX) 40 MG tablet Take 40 mg by mouth as needed.    . potassium chloride SA (K-DUR,KLOR-CON) 20 MEQ tablet Take 20 mEq by mouth daily.    . Semaglutide,0.25 or 0.5MG /DOS, (OZEMPIC, 0.25 OR 0.5 MG/DOSE,) 2 MG/1.5ML SOPN Inject 0.5 mg into the skin once a week. 3 pen 3  . sildenafil (VIAGRA) 50 MG tablet Take 1  tablet (50 mg total) by mouth daily as needed for erectile dysfunction. Do not take nitroglycerin with this medication (Patient taking differently: Take 50 mg by mouth as needed for erectile dysfunction. Do not take nitroglycerin with this medication) 10 tablet 3  . tadalafil (CIALIS) 20 MG tablet as needed.     . testosterone cypionate (DEPOTESTOSTERONE CYPIONATE) 200 MG/ML injection Takes every week    . traZODone (DESYREL) 100 MG tablet Take 100 mg by mouth at bedtime.    . isosorbide mononitrate (IMDUR) 60 MG 24 hr tablet He is not taking at this time     No current facility-administered medications for this encounter.    Allergies  Allergen Reactions  . Crestor [Rosuvastatin] Other (See Comments)    Muscle aches   . Lipitor [Atorvastatin] Other (See Comments)    Muscle aches  . Pravastatin Other (See Comments)    Muscle aches  . Codeine Itching and Other (See Comments)    Extremity tingling-- can take synthetic    Social History   Socioeconomic History  . Marital status: Married    Spouse name: Not on file  . Number of children: Not on file  . Years of education: Not on file  . Highest education level: Not on file  Occupational History  . Not on file  Tobacco Use  . Smoking status: Never Smoker  . Smokeless tobacco: Never Used  Substance and Sexual Activity  . Alcohol use: Yes    Comment: OCCASIONAL  . Drug use: No  . Sexual activity: Not on file  Other Topics Concern  . Not on file  Social History Narrative  . Not on file   Social Determinants of Health   Financial Resource Strain:   . Difficulty of Paying Living Expenses: Not on file  Food Insecurity:   . Worried About Charity fundraiser in the Last Year: Not on file  . Ran Out of Food in the Last Year: Not on file  Transportation Needs:   . Lack of Transportation (Medical): Not on file  . Lack of Transportation (Non-Medical): Not on file  Physical Activity:   .  Days of Exercise per Week: Not on file   . Minutes of Exercise per Session: Not on file  Stress:   . Feeling of Stress : Not on file  Social Connections:   . Frequency of Communication with Friends and Family: Not on file  . Frequency of Social Gatherings with Friends and Family: Not on file  . Attends Religious Services: Not on file  . Active Member of Clubs or Organizations: Not on file  . Attends Archivist Meetings: Not on file  . Marital Status: Not on file  Intimate Partner Violence:   . Fear of Current or Ex-Partner: Not on file  . Emotionally Abused: Not on file  . Physically Abused: Not on file  . Sexually Abused: Not on file    Family History  Problem Relation Age of Onset  . Other Mother        joint problems  . Hypertension Father   . Heart disease Father   . CVA Father   . Depression Father   . Heart disease Sister        CABG  . Stroke Sister   . Hypertension Sister   . Congestive Heart Failure Sister   . Diabetes Sister   . Heart disease Brother   . Hypertension Brother   . Congestive Heart Failure Brother     ROS- All systems are reviewed and negative except as per the HPI above  Physical Exam: Vitals:   09/07/19 1517  BP: 124/76  Pulse: 83  Weight: 95.6 kg  Height: 5\' 8"  (1.727 m)   Wt Readings from Last 3 Encounters:  09/07/19 95.6 kg  07/11/19 97 kg  05/23/19 92.1 kg    Labs: Lab Results  Component Value Date   NA 133 (L) 07/11/2019   K 3.7 07/11/2019   CL 96 (L) 07/11/2019   CO2 25 07/11/2019   GLUCOSE 205 (H) 07/11/2019   BUN 23 07/11/2019   CREATININE 3.44 (H) 07/11/2019   CALCIUM 9.6 07/11/2019   MG 2.2 03/26/2016   Lab Results  Component Value Date   INR 1.13 08/02/2018   Lab Results  Component Value Date   CHOL 149 07/11/2019   HDL 38 (L) 07/11/2019   LDLCALC 73 07/11/2019   TRIG 188 (H) 07/11/2019     GEN- The patient is well appearing, alert and oriented x 3 today.   Head- normocephalic, atraumatic Eyes-  Sclera clear, conjunctiva  pink Ears- hearing intact Oropharynx- clear Neck- supple, no JVP Lymph- no cervical lymphadenopathy Lungs- Clear to ausculation bilaterally, normal work of breathing Heart- irregular rate and rhythm, no murmurs, rubs or gallops, PMI not laterally displaced GI- soft, NT, ND, + BS Extremities- no clubbing, cyanosis, or edema MS- no significant deformity or atrophy Skin- no rash or lesion Psych- euthymic mood, full affect Neuro- strength and sensation are intact  EKG-afib at 83 bpm, with ILBBB EKG from  [place] reviewed from yesterday as well, controlled afib   Assessment and Plan: 1. Paroxysmal afib with CVR ? present for the last 2 weeks  Continue coreg 6.25 mg qd  No known trigger  Will plan for cardioversion next Wednesday with Dr. Aundra Dubin  Cbc/bmet/Covid today   2. CHA2DS2VASc score of 5 Denies any missed doses of anticoagulation x the last 3 weeks  Reminded not to miss any while waiting on DCCV  3. CHF  Weight stable   F/u here in one week after cardioversion  Butch Penny C. Esparanza Krider, [place]  [facility] [address] [place], [place] [address] [phone]

## 2019-09-09 ENCOUNTER — Other Ambulatory Visit (HOSPITAL_COMMUNITY)
Admission: RE | Admit: 2019-09-09 | Discharge: 2019-09-09 | Disposition: A | Discharge status: Home / Self Care | Payer: Medicare Other | Source: Ambulatory Visit | Attending: Cardiology | Admitting: Cardiology

## 2019-09-09 DIAGNOSIS — Z01812 Encounter for preprocedural laboratory examination: Secondary | ICD-10-CM | POA: Diagnosis not present

## 2019-09-09 DIAGNOSIS — Z20822 Contact with and (suspected) exposure to covid-19: Secondary | ICD-10-CM | POA: Diagnosis not present

## 2019-09-10 LAB — NOVEL CORONAVIRUS, NAA (HOSP ORDER, SEND-OUT TO REF LAB; TAT 18-24 HRS): SARS-CoV-2, NAA: NOT DETECTED

## 2019-09-13 ENCOUNTER — Encounter (HOSPITAL_COMMUNITY)
Admission: RE | Disposition: A | Discharge status: Home / Self Care | Payer: Self-pay | Source: Home / Self Care | Attending: Cardiology

## 2019-09-13 ENCOUNTER — Ambulatory Visit (HOSPITAL_COMMUNITY)
Admission: RE | Admit: 2019-09-13 | Discharge: 2019-09-13 | Disposition: A | Discharge status: Home / Self Care | Payer: Medicare Other | Attending: Cardiology | Admitting: Cardiology

## 2019-09-13 ENCOUNTER — Ambulatory Visit (HOSPITAL_COMMUNITY): Payer: Medicare Other

## 2019-09-13 ENCOUNTER — Other Ambulatory Visit: Payer: Self-pay

## 2019-09-13 ENCOUNTER — Encounter (HOSPITAL_COMMUNITY): Payer: Self-pay | Admitting: Cardiology

## 2019-09-13 DIAGNOSIS — K219 Gastro-esophageal reflux disease without esophagitis: Secondary | ICD-10-CM | POA: Diagnosis not present

## 2019-09-13 DIAGNOSIS — N189 Chronic kidney disease, unspecified: Secondary | ICD-10-CM | POA: Diagnosis not present

## 2019-09-13 DIAGNOSIS — E785 Hyperlipidemia, unspecified: Secondary | ICD-10-CM | POA: Insufficient documentation

## 2019-09-13 DIAGNOSIS — M1990 Unspecified osteoarthritis, unspecified site: Secondary | ICD-10-CM | POA: Insufficient documentation

## 2019-09-13 DIAGNOSIS — F329 Major depressive disorder, single episode, unspecified: Secondary | ICD-10-CM | POA: Insufficient documentation

## 2019-09-13 DIAGNOSIS — I130 Hypertensive heart and chronic kidney disease with heart failure and stage 1 through stage 4 chronic kidney disease, or unspecified chronic kidney disease: Secondary | ICD-10-CM | POA: Insufficient documentation

## 2019-09-13 DIAGNOSIS — E1122 Type 2 diabetes mellitus with diabetic chronic kidney disease: Secondary | ICD-10-CM | POA: Diagnosis not present

## 2019-09-13 DIAGNOSIS — Z951 Presence of aortocoronary bypass graft: Secondary | ICD-10-CM | POA: Diagnosis not present

## 2019-09-13 DIAGNOSIS — I4891 Unspecified atrial fibrillation: Secondary | ICD-10-CM | POA: Diagnosis not present

## 2019-09-13 DIAGNOSIS — I5032 Chronic diastolic (congestive) heart failure: Secondary | ICD-10-CM | POA: Insufficient documentation

## 2019-09-13 DIAGNOSIS — I252 Old myocardial infarction: Secondary | ICD-10-CM | POA: Insufficient documentation

## 2019-09-13 DIAGNOSIS — I2510 Atherosclerotic heart disease of native coronary artery without angina pectoris: Secondary | ICD-10-CM | POA: Diagnosis not present

## 2019-09-13 DIAGNOSIS — G4730 Sleep apnea, unspecified: Secondary | ICD-10-CM | POA: Diagnosis not present

## 2019-09-13 DIAGNOSIS — Z7901 Long term (current) use of anticoagulants: Secondary | ICD-10-CM | POA: Diagnosis not present

## 2019-09-13 DIAGNOSIS — Z79899 Other long term (current) drug therapy: Secondary | ICD-10-CM | POA: Diagnosis not present

## 2019-09-13 DIAGNOSIS — I480 Paroxysmal atrial fibrillation: Secondary | ICD-10-CM | POA: Diagnosis not present

## 2019-09-13 DIAGNOSIS — I110 Hypertensive heart disease with heart failure: Secondary | ICD-10-CM | POA: Diagnosis not present

## 2019-09-13 HISTORY — PX: CARDIOVERSION: SHX1299

## 2019-09-13 LAB — POCT I-STAT, CHEM 8
BUN: 42 mg/dL — ABNORMAL HIGH (ref 8–23)
Calcium, Ion: 1.15 mmol/L (ref 1.15–1.40)
Chloride: 100 mmol/L (ref 98–111)
Creatinine, Ser: 3.3 mg/dL — ABNORMAL HIGH (ref 0.61–1.24)
Glucose, Bld: 134 mg/dL — ABNORMAL HIGH (ref 70–99)
HCT: 47 % (ref 39.0–52.0)
Hemoglobin: 16 g/dL (ref 13.0–17.0)
Potassium: 4.6 mmol/L (ref 3.5–5.1)
Sodium: 137 mmol/L (ref 135–145)
TCO2: 30 mmol/L (ref 22–32)

## 2019-09-13 SURGERY — CARDIOVERSION
Anesthesia: General

## 2019-09-13 MED ORDER — PROPOFOL 10 MG/ML IV BOLUS
INTRAVENOUS | Status: DC | PRN
  Administered 2019-09-13: 60 mg via INTRAVENOUS

## 2019-09-13 MED ORDER — LIDOCAINE 2% (20 MG/ML) 5 ML SYRINGE
INTRAMUSCULAR | Status: DC | PRN
  Administered 2019-09-13: 40 mg via INTRAVENOUS

## 2019-09-13 MED ORDER — SODIUM CHLORIDE 0.9 % IV SOLN: INTRAVENOUS | Status: DC | PRN

## 2019-09-13 NOTE — Interval H&P Note (Signed)
History and Physical Interval Note:  09/13/2019 11:57 AM  [name]  has presented today for surgery, with the diagnosis of AFIB.  The various methods of treatment have been discussed with the patient and family. After consideration of risks, benefits and other options for treatment, the patient has consented to  Procedure(s): CARDIOVERSION (N/A) as a surgical intervention.  The patient's history has been reviewed, patient examined, no change in status, stable for surgery.  I have reviewed the patient's chart and labs.  Questions were answered to the patient's satisfaction.     Hilda Wexler [employer]

## 2019-09-13 NOTE — Procedures (Addendum)
Electrical Cardioversion Procedure Note [name] [id number] [birth year]  Procedure: Electrical Cardioversion Indications:  Atrial Fibrillation  Procedure Details Consent: Risks of procedure as well as the alternatives and risks of each were explained to the (patient/caregiver).  Consent for procedure obtained. Time Out: Verified patient identification, verified procedure, site/side was marked, verified correct patient position, special equipment/implants available, medications/allergies/relevent history reviewed, required imaging and test results available.  Performed  Patient placed on cardiac monitor, pulse oximetry, supplemental oxygen as necessary.  Sedation given: Propofol per anesthesiology Pacer pads placed anterior and posterior chest.  Cardioverted 1 time(s).  Cardioverted at [place].  Evaluation Findings: Post procedure EKG shows: NSR with PACs Complications: None Patient did tolerate procedure well.   [name] 09/13/2019, 12:11 PM

## 2019-09-13 NOTE — Transfer of Care (Signed)
Immediate Anesthesia Transfer of Care Note  Patient: [name]  Procedure(s) Performed: CARDIOVERSION (N/A )  Patient Location: Endoscopy Unit  Anesthesia Type:General  Level of Consciousness: drowsy  Airway & Oxygen Therapy: Patient Spontanous Breathing and Patient connected to nasal cannula oxygen  Post-op Assessment: Report given to RN and Post -op Vital signs reviewed and stable  Post vital signs: Reviewed  Last Vitals:  Vitals Value Taken Time  BP    Temp    Pulse    Resp    SpO2      Last Pain:  Vitals:   09/13/19 1129  TempSrc: Temporal  PainSc: 0-No pain         Complications: No apparent anesthesia complications

## 2019-09-13 NOTE — Anesthesia Preprocedure Evaluation (Addendum)
Anesthesia Evaluation  Patient identified by MRN, date of birth, ID band Patient awake    Reviewed: Allergy & Precautions, NPO status , Patient's Chart, lab work & pertinent test results  Airway Mallampati: I  TM Distance: >3 FB Neck ROM: Full    Dental  (+) Teeth Intact, Dental Advisory Given   Pulmonary sleep apnea ,    breath sounds clear to auscultation       Cardiovascular hypertension, Pt. on home beta blockers + CAD, + Past MI, + Cardiac Stents, + CABG and +CHF  + dysrhythmias Atrial Fibrillation + Valvular Problems/Murmurs MR  Rhythm:Irregular Rate:Abnormal     Neuro/Psych PSYCHIATRIC DISORDERS Anxiety Depression  Neuromuscular disease    GI/Hepatic Neg liver ROS, GERD  Medicated,  Endo/Other  diabetes, Type 2, Oral Hypoglycemic Agents  Renal/GU Renal disease     Musculoskeletal  (+) Arthritis ,   Abdominal Normal abdominal exam  (+)   Peds  Hematology negative hematology ROS (+)   Anesthesia Other Findings   Reproductive/Obstetrics                           Echo:  1. Left ventricular ejection fraction, by visual estimation, is 50 to 55%. The left ventricle has normal function. There is mildly increased left ventricular hypertrophy.  2. Left ventricular diastolic parameters are consistent with Grade II diastolic dysfunction (pseudonormalization).  3. The left ventricle has no regional wall motion abnormalities.  4. Global right ventricle has mildly reduced systolic function.The right ventricular size is moderately enlarged. No increase in right ventricular wall thickness.  5. Left atrial size was severely dilated.  6. Right atrial size was moderately dilated.  7. Moderate calcification of the mitral valve leaflet(s).  8. Moderate mitral annular calcification.  9. Moderate thickening of the mitral valve leaflet(s). 10. The mitral valve is normal in structure. Mild mitral valve  regurgitation. Moderate mitral stenosis. 11. The tricuspid valve is normal in structure. Tricuspid valve regurgitation is mild. 12. The aortic valve is normal in structure. Aortic valve regurgitation is not visualized. Mild to moderate aortic valve sclerosis/calcification without any evidence of aortic stenosis. 13. The pulmonic valve was normal in structure. Pulmonic valve regurgitation is not visualized. 14. Moderately elevated pulmonary artery systolic pressure. 15. The tricuspid regurgitant velocity is 2.62 m/s, and with an assumed right atrial pressure of 15 mmHg, the estimated right ventricular systolic pressure is moderately elevated at 42.5 mmHg. 16. The inferior vena cava is dilated in size with <50% respiratory variability, suggesting right atrial pressure of 15 mmHg.  Anesthesia Physical Anesthesia Plan  ASA: III  Anesthesia Plan: General   Post-op Pain Management:    Induction: Intravenous  PONV Risk Score and Plan: Treatment may vary due to age or medical condition  Airway Management Planned: Natural Airway and Simple Face Mask  Additional Equipment: None  Intra-op Plan:   Post-operative Plan:   Informed Consent: I have reviewed the patients History and Physical, chart, labs and discussed the procedure including the risks, benefits and alternatives for the proposed anesthesia with the patient or authorized representative who has indicated his/her understanding and acceptance.       Plan Discussed with: CRNA  Anesthesia Plan Comments:         Anesthesia Quick Evaluation

## 2019-09-13 NOTE — Anesthesia Postprocedure Evaluation (Signed)
Anesthesia Post Note  Patient: [name]  Procedure(s) Performed: CARDIOVERSION (N/A )     Patient location during evaluation: PACU Anesthesia Type: General Level of consciousness: awake and alert Pain management: pain level controlled Vital Signs Assessment: post-procedure vital signs reviewed and stable Respiratory status: spontaneous breathing, nonlabored ventilation, respiratory function stable and patient connected to nasal cannula oxygen Cardiovascular status: blood pressure returned to baseline and stable Postop Assessment: no apparent nausea or vomiting Anesthetic complications: no    Last Vitals:  Vitals:   09/13/19 1215 09/13/19 1220  BP: (!) 184/81 (!) 173/79  Pulse: 83 71  Resp: 17 20  Temp: 36.6 C   SpO2: 100% 100%    Last Pain:  Vitals:   09/13/19 1220  TempSrc:   PainSc: 0-No pain                 [name]

## 2019-09-13 NOTE — Anesthesia Procedure Notes (Signed)
Procedure Name: General with mask airway Date/Time: 09/13/2019 12:05 PM Performed by: Janene Harvey, CRNA Pre-anesthesia Checklist: Patient identified, Emergency Drugs available, Suction available and Patient being monitored Oxygen Delivery Method: Ambu bag Dental Injury: Teeth and Oropharynx as per pre-operative assessment

## 2019-09-20 ENCOUNTER — Other Ambulatory Visit: Payer: Self-pay

## 2019-09-20 ENCOUNTER — Encounter (HOSPITAL_COMMUNITY): Payer: Self-pay

## 2019-09-20 ENCOUNTER — Encounter (HOSPITAL_COMMUNITY): Payer: Self-pay | Admitting: Nurse Practitioner

## 2019-09-20 ENCOUNTER — Ambulatory Visit (HOSPITAL_COMMUNITY)
Admission: RE | Admit: 2019-09-20 | Discharge: 2019-09-20 | Disposition: A | Discharge status: Home / Self Care | Payer: Medicare Other | Source: Ambulatory Visit | Attending: Nurse Practitioner | Admitting: Nurse Practitioner

## 2019-09-20 VITALS — BP 116/88 | HR 82 | Ht 68.0 in | Wt 211.0 lb

## 2019-09-20 DIAGNOSIS — N189 Chronic kidney disease, unspecified: Secondary | ICD-10-CM | POA: Diagnosis not present

## 2019-09-20 DIAGNOSIS — I340 Nonrheumatic mitral (valve) insufficiency: Secondary | ICD-10-CM | POA: Insufficient documentation

## 2019-09-20 DIAGNOSIS — I130 Hypertensive heart and chronic kidney disease with heart failure and stage 1 through stage 4 chronic kidney disease, or unspecified chronic kidney disease: Secondary | ICD-10-CM | POA: Diagnosis not present

## 2019-09-20 DIAGNOSIS — G4730 Sleep apnea, unspecified: Secondary | ICD-10-CM | POA: Diagnosis not present

## 2019-09-20 DIAGNOSIS — D6869 Other thrombophilia: Secondary | ICD-10-CM | POA: Diagnosis not present

## 2019-09-20 DIAGNOSIS — I4892 Unspecified atrial flutter: Secondary | ICD-10-CM | POA: Diagnosis not present

## 2019-09-20 DIAGNOSIS — I480 Paroxysmal atrial fibrillation: Secondary | ICD-10-CM | POA: Insufficient documentation

## 2019-09-20 DIAGNOSIS — Z7901 Long term (current) use of anticoagulants: Secondary | ICD-10-CM | POA: Insufficient documentation

## 2019-09-20 DIAGNOSIS — G709 Myoneural disorder, unspecified: Secondary | ICD-10-CM | POA: Diagnosis not present

## 2019-09-20 DIAGNOSIS — I252 Old myocardial infarction: Secondary | ICD-10-CM | POA: Insufficient documentation

## 2019-09-20 DIAGNOSIS — I2510 Atherosclerotic heart disease of native coronary artery without angina pectoris: Secondary | ICD-10-CM | POA: Insufficient documentation

## 2019-09-20 DIAGNOSIS — Z8249 Family history of ischemic heart disease and other diseases of the circulatory system: Secondary | ICD-10-CM | POA: Insufficient documentation

## 2019-09-20 DIAGNOSIS — Z79899 Other long term (current) drug therapy: Secondary | ICD-10-CM | POA: Insufficient documentation

## 2019-09-20 DIAGNOSIS — K219 Gastro-esophageal reflux disease without esophagitis: Secondary | ICD-10-CM | POA: Diagnosis not present

## 2019-09-20 DIAGNOSIS — Z8673 Personal history of transient ischemic attack (TIA), and cerebral infarction without residual deficits: Secondary | ICD-10-CM | POA: Diagnosis not present

## 2019-09-20 DIAGNOSIS — E785 Hyperlipidemia, unspecified: Secondary | ICD-10-CM | POA: Diagnosis not present

## 2019-09-20 DIAGNOSIS — F4310 Post-traumatic stress disorder, unspecified: Secondary | ICD-10-CM | POA: Diagnosis not present

## 2019-09-20 DIAGNOSIS — E119 Type 2 diabetes mellitus without complications: Secondary | ICD-10-CM | POA: Insufficient documentation

## 2019-09-20 DIAGNOSIS — M1711 Unilateral primary osteoarthritis, right knee: Secondary | ICD-10-CM | POA: Diagnosis not present

## 2019-09-20 DIAGNOSIS — I4819 Other persistent atrial fibrillation: Secondary | ICD-10-CM | POA: Diagnosis not present

## 2019-09-20 DIAGNOSIS — I5032 Chronic diastolic (congestive) heart failure: Secondary | ICD-10-CM | POA: Insufficient documentation

## 2019-09-21 ENCOUNTER — Encounter (HOSPITAL_COMMUNITY): Payer: Self-pay | Admitting: Nurse Practitioner

## 2019-09-21 ENCOUNTER — Other Ambulatory Visit: Payer: Self-pay

## 2019-09-21 ENCOUNTER — Other Ambulatory Visit (HOSPITAL_COMMUNITY): Payer: Self-pay | Admitting: *Deleted

## 2019-09-21 DIAGNOSIS — E559 Vitamin D deficiency, unspecified: Secondary | ICD-10-CM | POA: Diagnosis not present

## 2019-09-21 DIAGNOSIS — D6859 Other primary thrombophilia: Secondary | ICD-10-CM | POA: Diagnosis not present

## 2019-09-21 DIAGNOSIS — E291 Testicular hypofunction: Secondary | ICD-10-CM | POA: Diagnosis not present

## 2019-09-21 DIAGNOSIS — E1122 Type 2 diabetes mellitus with diabetic chronic kidney disease: Secondary | ICD-10-CM | POA: Diagnosis not present

## 2019-09-21 DIAGNOSIS — R972 Elevated prostate specific antigen [PSA]: Secondary | ICD-10-CM | POA: Diagnosis not present

## 2019-09-21 DIAGNOSIS — Z1389 Encounter for screening for other disorder: Secondary | ICD-10-CM | POA: Diagnosis not present

## 2019-09-21 DIAGNOSIS — I509 Heart failure, unspecified: Secondary | ICD-10-CM | POA: Diagnosis not present

## 2019-09-21 DIAGNOSIS — Z0000 Encounter for general adult medical examination without abnormal findings: Secondary | ICD-10-CM | POA: Diagnosis not present

## 2019-09-21 DIAGNOSIS — I4891 Unspecified atrial fibrillation: Secondary | ICD-10-CM | POA: Diagnosis not present

## 2019-09-21 DIAGNOSIS — N184 Chronic kidney disease, stage 4 (severe): Secondary | ICD-10-CM | POA: Diagnosis not present

## 2019-09-21 DIAGNOSIS — E7800 Pure hypercholesterolemia, unspecified: Secondary | ICD-10-CM | POA: Diagnosis not present

## 2019-09-21 MED ORDER — AMIODARONE HCL 200 MG PO TABS: ORAL_TABLET | ORAL | 0 refills | Status: DC

## 2019-09-21 NOTE — Progress Notes (Signed)
Primary Care Physician: [name], MD Referring Physician: Thayer [place] Cardiologist: Dr. Shelba Flake is a 74 y.o. male with a h/o CKD, CAD s/p CABG and obesity s/p lap banding. In 2017, he developed increased exertional dyspnea.  Eventually had a TEE showing severe MR with prolapse of a portion of the anterior mitral valve leaflet.  Coronary angiography in 6/17 showed patent grafts and 90% stenosis of distal diagonal not amenable to intervention.  In 8/17, he had mitral valve repair.  Post-op diastolic CHF, Lasix started and increased.   He developed atrial arrhythmias in 12/18.  Both a. fibrillation and flutter were seen. Saw Dr. Rayann Heman, recommended DCCV,   amiodarone or sotalol if fibrillation recurred shortly after cardioversion.  He had DCCV in 1/19 and  has been in [place] for the last 2 years.Marland Kitchen   He is now in afib clinic as he was seen in the [place] yesterday and was noted to be in rate controlled afib. Was referred here. He continues in rate controlled afib today. He was unaware that he was out of rhythm, but now looking back, he feels that he has had fatigue for around 2 weeks.He does not know of an trigger other than  has had some issues with gout. No  missed anticoagulation for at least 3 weeks with a CHA2DS2VASc score of at least 5.  F/u in afib clinic, 08/2819. He had a successful cardioversion and felt well for the day, then started feeling worse the next day. He is back in rate controlled afib.  Today, he denies symptoms of palpitations, chest pain, shortness of breath, orthopnea, PND, lower extremity edema, dizziness, presyncope, syncope, or neurologic sequela.+ fatigue. The patient is tolerating medications without difficulties and is otherwise without complaint today.   Past Medical History:  Diagnosis Date  . Allergic rhinitis   . Anxiety   . Atrial fibrillation ([place])   . Cancer ([place])    hx of skin cancers   . Chronic kidney disease   . Coronary artery  disease    a. s/p MI & CABG; b. s/p PCI Diag;  c. Cath 12/2011: LAD diff dzs/small, Diag 75% isr, 90 dist to stent (small), LCX & RCA occluded, VG->OM 40, VG->PDA patent - med rx.  . Depression    PTSD  . Diabetes mellitus    not on medications  . Diastolic CHF, chronic (HCC)    a. EF 55-60% by echo 2007  . Dysrhythmia    "abnormal beat"  . GERD (gastroesophageal reflux disease)    "not anymore" " I had a lap band"  . History of diverticulitis of colon    5 YRS AGO  . History of pneumonia    2017  . History of transient ischemic attack (TIA)    CAROTID DOPPLERS NOV 2011  0-39& BIL. STENOSIS  . Hyperlipidemia   . Hypertension   . Mitral regurgitation   . Myocardial infarction ([place])   . Neuromuscular disorder ([place])    left arm numbness   . 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 PTSD (post-traumatic stress disorder)    from [place]  . 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 Shortness of breath dyspnea    with exertion  . Sleep apnea  cpap- see ov note in [facility] 05/12/11 for settings    Past Surgical History:  Procedure Laterality Date  . AV FISTULA PLACEMENT Left 08/02/2018   Procedure: ARTERIOVENOUS (AV) FISTULA CREATION RADIOCEPHALIC;  Surgeon: Angelia Mould, MD;  Location: [place];  Service: Vascular;  Laterality: Left;  . BLEPHAROPLASTY  1985   BILATERAL  . CARDIAC CATHETERIZATION  2001, 2004, 2009, 2011  . CARDIAC CATHETERIZATION N/A 01/23/2016   Procedure: Right/Left Heart Cath and Coronary Angiography;  Surgeon: [name], MD;  Location: [place] CV LAB;  Service: Cardiovascular;  Laterality: N/A;  . CARDIOVERSION N/A 09/07/2017   Procedure: CARDIOVERSION;  Surgeon: [name], MD;  Location: [facility] ENDOSCOPY;   Service: Cardiovascular;  Laterality: N/A;  . CARDIOVERSION N/A 09/13/2019   Procedure: CARDIOVERSION;  Surgeon: [name], MD;  Location: [facility] ENDOSCOPY;  Service: Cardiovascular;  Laterality: N/A;  . CATARACT EXTRACTION W/ INTRAOCULAR LENS IMPLANT Bilateral   . CHONDROPLASTY  07/22/2011   Procedure: CHONDROPLASTY;  Surgeon: Dione Plover Aluisio;  Location: [place];  Service: Orthopedics;;  . CIRCUMCISION  35 YRS  AGO  . COLONOSCOPY    . CORONARY ANGIOPLASTY  1996-- POST CABG   W/ STENT, last cath 01/07/2012   . CORONARY ARTERY BYPASS GRAFT  1995   X3 VESSEL  . CORONARY STENT PLACEMENT    . KNEE ARTHROSCOPY  07/22/2011   Procedure: ARTHROSCOPY KNEE;  Surgeon: [name];  Location: [place];  Service: Orthopedics;  Laterality: Right;  WITH DEBRIDEMENT   . KNEE ARTHROSCOPY W/ MENISCECTOMY  X2 IN 2002-- RIGHT KNEE  . LAPAROSCOPIC GASTRIC BANDING  01-15-09  . LEFT HEART CATH AND CORONARY ANGIOGRAPHY N/A 04/29/2017   Procedure: LEFT HEART CATH AND CORONARY ANGIOGRAPHY;  Surgeon: [name], MD;  Location: [place] CV LAB;  Service: Cardiovascular;  Laterality: N/A;  . LEFT HEART CATHETERIZATION WITH CORONARY ANGIOGRAM N/A 09/11/2013   Procedure: LEFT HEART CATHETERIZATION WITH CORONARY ANGIOGRAM;  Surgeon: [name], MD;  Location: [facility] CATH LAB;  Service: Cardiovascular;  Laterality: N/A;  . MITRAL VALVE REPAIR Right 03/25/2016   Procedure: MINIMALLY INVASIVE REOPERATION FOR MITRAL VALVE REPAIR  (MVR) with size 26 Sorin Memo 3D;  Surgeon: [name], MD;  Location: [place];  Service: Open Heart Surgery;  Laterality: Right;  . RIGHT KNEE ED COMPARTMENT REPLACEMENT  2004  . SYNOVECTOMY  07/22/2011   Procedure: SYNOVECTOMY;  Surgeon: [name];  Location: [place];  Service: Orthopedics;;  . TEE WITHOUT CARDIOVERSION N/A 01/23/2016   Procedure: TRANSESOPHAGEAL ECHOCARDIOGRAM (TEE);  Surgeon: [name], MD;   Location: [place];  Service: Cardiovascular;  Laterality: N/A;  . TEE WITHOUT CARDIOVERSION N/A 03/25/2016   Procedure: TRANSESOPHAGEAL ECHOCARDIOGRAM (TEE);  Surgeon: [name], MD;  Location: [place];  Service: Open Heart Surgery;  Laterality: N/A;  . UMBILICAL HERNIA REPAIR  01-15-09   W/ GASTRIC BANDING PROCEDURE    Current Outpatient Medications  Medication Sig Dispense Refill  . Alirocumab (PRALUENT) 75 MG/ML SOAJ Inject 75 mg into the skin every 14 (fourteen) days.    Marland Kitchen allopurinol (ZYLOPRIM) 100 MG tablet Take 200 mg by mouth daily.     Marland Kitchen amoxicillin (AMOXIL) 500 MG tablet Take 4 tablets (2 g) by mouth 30-60 minutes prior to dental appointment (Patient taking differently: Take 2,000 mg by mouth See admin instructions. Take 4 tablets (2 g) by mouth 30-60 minutes prior to dental appointment) 12 tablet 0  .  apixaban (ELIQUIS) 5 MG TABS tablet Take 1 tablet (5 mg total) by mouth 2 (two) times daily. 180 tablet 3  . Artificial Saliva (MOUTH KOTE MT) Use as directed 4 sprays in the mouth or throat every 3 (three) hours as needed (for dry mouth).    . calcitRIOL (ROCALTROL) 0.25 MCG capsule Take 0.25 mcg by mouth at bedtime.     . carvedilol (COREG) 6.25 MG tablet Take 6.25 mg by mouth daily.    . cetirizine (ZYRTEC) 10 MG tablet Take 10 mg by mouth daily.     . colchicine 0.6 MG tablet Take 0.6-1.2 mg by mouth 2 (two) times daily as needed (gout).     . furosemide (LASIX) 40 MG tablet Take 80 mg by mouth daily.     Marland Kitchen glipiZIDE (GLUCOTROL) 5 MG tablet Take 1 tablet (5 mg total) by mouth daily before supper. (Patient taking differently: Take 5 mg by mouth daily. ) 90 tablet 3  . HYDROcodone-acetaminophen (NORCO/VICODIN) 5-325 MG tablet Take 0.5 tablets by mouth as needed for moderate pain.     . Hypromellose (ARTIFICIAL TEARS OP) Apply 1 drop to eye 2 (two) times daily as needed (dry eyes).    . nitroGLYCERIN (NITROSTAT) 0.4 MG SL tablet PLACE 1 TABLET UNDER THE TONGUE EVERY 5 MINUTES AS  NEEDED FOR CHEST PAIN UP TO 3 DOSES, IF SYMPTOMS PERSIST CALL 911 (Patient taking differently: Place 0.4 mg under the tongue every 5 (five) minutes x 3 doses as needed for chest pain. ) 25 tablet 3  . pantoprazole (PROTONIX) 40 MG tablet Take 40 mg by mouth daily as needed (acid reflux/indigestion.).     Marland Kitchen potassium chloride SA (K-DUR,KLOR-CON) 20 MEQ tablet Take 20 mEq by mouth daily.    . Semaglutide,0.25 or 0.5MG /DOS, (OZEMPIC, 0.25 OR 0.5 MG/DOSE,) 2 MG/1.5ML SOPN Inject 0.5 mg into the skin once a week. (Patient taking differently: Inject 0.5 mg into the skin every Thursday. ) 3 pen 3  . sildenafil (VIAGRA) 50 MG tablet Take 1 tablet (50 mg total) by mouth daily as needed for erectile dysfunction. Do not take nitroglycerin with this medication 10 tablet 3  . tadalafil (CIALIS) 20 MG tablet Take 20 mg by mouth daily as needed (erectile dysfunction.).     Marland Kitchen testosterone cypionate (DEPOTESTOSTERONE CYPIONATE) 200 MG/ML injection Inject 400 mg into the muscle once a week.     . traZODone (DESYREL) 100 MG tablet Take 100 mg by mouth at bedtime.     No current facility-administered medications for this encounter.    Allergies  Allergen Reactions  . Crestor [Rosuvastatin] Other (See Comments)    Muscle aches   . Lipitor [Atorvastatin] Other (See Comments)    Muscle aches  . Pravastatin Other (See Comments)    Muscle aches  . Codeine Itching and Other (See Comments)    Extremity tingling-- can take synthetic    Social History   Socioeconomic History  . Marital status: Married    Spouse name: Not on file  . Number of children: Not on file  . Years of education: Not on file  . Highest education level: Not on file  Occupational History  . Not on file  Tobacco Use  . Smoking status: Never Smoker  . Smokeless tobacco: Never Used  Substance and Sexual Activity  . Alcohol use: Yes    Alcohol/week: 2.0 standard drinks    Types: 1 Glasses of wine, 1 Shots of liquor per week    Comment:  OCCASIONAL  .  Drug use: No  . Sexual activity: Not on file  Other Topics Concern  . Not on file  Social History Narrative  . Not on file   Social Determinants of Health   Financial Resource Strain:   . Difficulty of Paying Living Expenses: Not on file  Food Insecurity:   . Worried About Charity fundraiser in the Last Year: Not on file  . Ran Out of Food in the Last Year: Not on file  Transportation Needs:   . Lack of Transportation (Medical): Not on file  . Lack of Transportation (Non-Medical): Not on file  Physical Activity:   . Days of Exercise per Week: Not on file  . Minutes of Exercise per Session: Not on file  Stress:   . Feeling of Stress : Not on file  Social Connections:   . Frequency of Communication with Friends and Family: Not on file  . Frequency of Social Gatherings with Friends and Family: Not on file  . Attends Religious Services: Not on file  . Active Member of Clubs or Organizations: Not on file  . Attends Archivist Meetings: Not on file  . Marital Status: Not on file  Intimate Partner Violence:   . Fear of Current or Ex-Partner: Not on file  . Emotionally Abused: Not on file  . Physically Abused: Not on file  . Sexually Abused: Not on file    Family History  Problem Relation Age of Onset  . Other Mother        joint problems  . Hypertension Father   . Heart disease Father   . CVA Father   . Depression Father   . Heart disease Sister        CABG  . Stroke Sister   . Hypertension Sister   . Congestive Heart Failure Sister   . Diabetes Sister   . Heart disease Brother   . Hypertension Brother   . Congestive Heart Failure Brother     ROS- All systems are reviewed and negative except as per the HPI above  Physical Exam: Vitals:   09/20/19 1503  BP: 116/88  Pulse: 82  Weight: 95.7 kg  Height: 5\' 8"  (1.727 m)   Wt Readings from Last 3 Encounters:  09/20/19 95.7 kg  09/13/19 93 kg  09/07/19 95.6 kg    Labs: Lab Results   Component Value Date   NA 137 09/13/2019   K 4.6 09/13/2019   CL 100 09/13/2019   CO2 25 07/11/2019   GLUCOSE 134 (H) 09/13/2019   BUN 42 (H) 09/13/2019   CREATININE 3.30 (H) 09/13/2019   CALCIUM 9.6 07/11/2019   MG 2.2 03/26/2016   Lab Results  Component Value Date   INR 1.13 08/02/2018   Lab Results  Component Value Date   CHOL 149 07/11/2019   HDL 38 (L) 07/11/2019   LDLCALC 73 07/11/2019   TRIG 188 (H) 07/11/2019     GEN- The patient is well appearing, alert and oriented x 3 today.   Head- normocephalic, atraumatic Eyes-  Sclera clear, conjunctiva pink Ears- hearing intact Oropharynx- clear Neck- supple, no JVP Lymph- no cervical lymphadenopathy Lungs- Clear to ausculation bilaterally, normal work of breathing Heart- irregular rate and rhythm, no murmurs, rubs or gallops, PMI not laterally displaced GI- soft, NT, ND, + BS Extremities- no clubbing, cyanosis, or edema MS- no significant deformity or atrophy Skin- no rash or lesion Psych- euthymic mood, full affect Neuro- strength and sensation are intact  EKG-afib  at 82 bpm, with ILBBB    Assessment and Plan: 1. Paroxysmal afib with CVR ? present for the last 2-3 weeks  Successful cardioversion but lasted around one day  Discussed with pt and Dr. Aundra Dubin, only option for antiarrythmic  with crcl cal at 25.80 would be amiodarone  Risk vrs benefit discussed in  detail with pt Will start amiodarone 200 mg bid  for one week and then he will return for EKG If does not convert on drug, will plan  for cardioversion in one month  Continue coreg 6.25 mg qd    2. CHA2DS2VASc score of 5 Continue eliquis 5 mg bid   3. CHF  Weight stable   F/u here in one week for  EKG starting amiodarone   Butch Penny C. Mosella Kasa, [facility] [address] [place], [place] [address] [phone]

## 2019-09-22 DIAGNOSIS — D0439 Carcinoma in situ of skin of other parts of face: Secondary | ICD-10-CM | POA: Diagnosis not present

## 2019-09-22 DIAGNOSIS — D485 Neoplasm of uncertain behavior of skin: Secondary | ICD-10-CM | POA: Diagnosis not present

## 2019-09-22 DIAGNOSIS — Z85828 Personal history of other malignant neoplasm of skin: Secondary | ICD-10-CM | POA: Diagnosis not present

## 2019-09-22 DIAGNOSIS — C44329 Squamous cell carcinoma of skin of other parts of face: Secondary | ICD-10-CM | POA: Diagnosis not present

## 2019-09-22 DIAGNOSIS — L988 Other specified disorders of the skin and subcutaneous tissue: Secondary | ICD-10-CM | POA: Diagnosis not present

## 2019-09-22 DIAGNOSIS — L570 Actinic keratosis: Secondary | ICD-10-CM | POA: Diagnosis not present

## 2019-09-25 ENCOUNTER — Encounter: Payer: Self-pay | Admitting: Internal Medicine

## 2019-09-25 ENCOUNTER — Ambulatory Visit (INDEPENDENT_AMBULATORY_CARE_PROVIDER_SITE_OTHER): Payer: Medicare Other | Admitting: Internal Medicine

## 2019-09-25 ENCOUNTER — Other Ambulatory Visit: Payer: Self-pay

## 2019-09-25 VITALS — BP 110/80 | HR 80 | Ht 68.0 in | Wt 208.8 lb

## 2019-09-25 DIAGNOSIS — E1159 Type 2 diabetes mellitus with other circulatory complications: Secondary | ICD-10-CM

## 2019-09-25 DIAGNOSIS — E782 Mixed hyperlipidemia: Secondary | ICD-10-CM | POA: Diagnosis not present

## 2019-09-25 DIAGNOSIS — E669 Obesity, unspecified: Secondary | ICD-10-CM

## 2019-09-25 DIAGNOSIS — E1165 Type 2 diabetes mellitus with hyperglycemia: Secondary | ICD-10-CM

## 2019-09-25 LAB — POCT GLYCOSYLATED HEMOGLOBIN (HGB A1C): Hemoglobin A1C: 7.1 % — AB (ref 4.0–5.6)

## 2019-09-25 NOTE — Patient Instructions (Signed)
Please continue: - Glipizide 5 mg before b'fast - Ozempic 0.5 mg weekly   Please return in 4 months with your sugar log.

## 2019-09-25 NOTE — Progress Notes (Signed)
Patient ID: [name], male   DOB: 21-Apr-1946, 74 y.o.   MRN: [id number]   This visit occurred during the SARS-CoV-2 public health emergency.  Safety protocols were in place, including screening questions prior to the visit, additional usage of staff PPE, and extensive cleaning of exam room while observing appropriate contact time as indicated for disinfecting solutions.   HPI: [name] is a 74 y.o.-year-old male, initially referred by his nephrologist, Dr. 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 fib, CKD stage V, cerebrovascular disease, s/p TIA, ED).  Last visit 4 months ago.  He had cardioversion since last OV. This lasted 1 day and he is on medication >> may need to have another Cardioversion.  He will have his first flu shot dose tomorrow through the [place].  Reviewed latest HbA1c levels: Lab Results  Component Value Date   HGBA1C 7.1 (A) 05/23/2019   HGBA1C 8.5 (A) 02/02/2019   HGBA1C 7.2 (H) 03/23/2016   HGBA1C 6.5 (H) 08/20/2012  01/11/2019: HbA1c 8.3% 10/04/2018: HbA1c 8.2%  He is currently on: - Ozempic 0.5 mg weekly - Glipizide 5 mg before a larger dinner >> before b'fast He came off metformin due to worsening kidney function. Stopped Glipizide 5 mg before b'fast and dinner when we started Ozempic, but restarted it 04/2019.  Pt checks his sugars once a day, per review of his meter downloads: - am: n/c >> 169-299 >> 161-185, 193 >> 85, 120-157, 170, 182 - 2h after b'fast: n/c - before lunch: n/c - 2h after lunch: n/c - before dinner: 129 >> n/c - 2h after dinner: n/c >> 190 - bedtime: n/c - nighttime: n/c Lowest sugar was 169 >> 161 >> 85; he has hypoglycemia awareness in the 70s Highest sugar was 299 >> 193 >> 190.  Pt's meals are: - Breakfast: egg and bacon (not lately).   - Brunch: meat + veggies - Dinner: same as lunch - Snacks:  crackers, popcorn   -He has stage V CKD and see nephrology.  He had a fistula placed but no need for dialysis yet; last BUN/creatinine:  Lab Results  Component Value Date   BUN 42 (H) 09/13/2019   BUN 23 07/11/2019   CREATININE 3.30 (H) 09/13/2019   CREATININE 3.44 (H) 07/11/2019  01/11/2019: 45//3.79, GFR 16, glucose 149, ACR 52.5 10/04/2018: 32/3.78, GFR 15, glucose 184 He is not on an ACE inhibitor/ARB  -+ HL; last set of lipids: Lab Results  Component Value Date   CHOL 149 07/11/2019   HDL 38 (L) 07/11/2019   LDLCALC 73 07/11/2019   TRIG 188 (H) 07/11/2019   CHOLHDL 3.9 07/11/2019  He was on pravastatin but had to stop due to generalized aches and pains.  She is also off Zetia and currently on Praluent every 2 weeks.  - last eye exam was in 01/2019: Reportedly no DR. Dr. Katy Fitch.  -No numbness and tingling in his feet.  Pt has FH of DM in sister and brother.  He has a history of lap band surgery 2010. Lost from 373 lbs to 215-220 lbs.  She also has a history of HTN.  ROS: Constitutional: + Weight gain/no weight loss, + fatigue, no subjective hyperthermia, no subjective hypothermia Eyes: no blurry vision, no xerophthalmia ENT: no sore throat, no nodules palpated in neck, no dysphagia, no odynophagia, no hoarseness Cardiovascular: no CP/no SOB/no palpitations/no leg swelling Respiratory: no cough/no SOB/no wheezing  Gastrointestinal: no N/no V/no D/no C/no acid reflux Musculoskeletal: no muscle aches/no joint aches Skin: no rashes, no hair loss Neurological: no tremors/no numbness/no tingling/no dizziness  I reviewed pt's medications, allergies, PMH, social hx, family hx, and changes were documented in the history of present illness. Otherwise, unchanged from my initial visit note.  Past Medical History:  Diagnosis Date  . Allergic rhinitis   . Anxiety   . Atrial fibrillation ([place])   . Cancer ([place])    hx of skin cancers   . Chronic kidney disease   . Coronary  artery disease    a. s/p MI & CABG; b. s/p PCI Diag;  c. Cath 12/2011: LAD diff dzs/small, Diag 75% isr, 90 dist to stent (small), LCX & RCA occluded, VG->OM 40, VG->PDA patent - med rx.  . Depression    PTSD  . Diabetes mellitus    not on medications  . Diastolic CHF, chronic (HCC)    a. EF 55-60% by echo 2007  . Dysrhythmia    "abnormal beat"  . GERD (gastroesophageal reflux disease)    "not anymore" " I had a lap band"  . History of diverticulitis of colon    5 YRS AGO  . History of pneumonia    2017  . History of transient ischemic attack (TIA)    CAROTID DOPPLERS NOV 2011  0-39& BIL. STENOSIS  . Hyperlipidemia   . Hypertension   . Mitral regurgitation   . Myocardial infarction ([place])   . Neuromuscular disorder ([place])    left arm numbness   . 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 PTSD (post-traumatic stress disorder)    from [place]  . 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 Shortness of breath dyspnea    with exertion  . Sleep apnea    cpap- see ov [facility] in [facility] 05/12/11 for settings    Past Surgical History:  Procedure Laterality Date  . AV FISTULA PLACEMENT Left 08/02/2018   Procedure: ARTERIOVENOUS (AV) FISTULA CREATION RADIOCEPHALIC;  Surgeon: Angelia Mould, MD;  Location: [place];  Service: Vascular;  Laterality: Left;  . BLEPHAROPLASTY  1985   BILATERAL  . CARDIAC CATHETERIZATION  2001, 2004, 2009, 2011  . CARDIAC CATHETERIZATION N/A 01/23/2016   Procedure: Right/Left Heart Cath and Coronary Angiography;  Surgeon: [name], MD;  Location: [place] CV LAB;  Service: Cardiovascular;  Laterality: N/A;  . CARDIOVERSION N/A 09/07/2017   Procedure: CARDIOVERSION;  Surgeon: [name], MD;  Location: [facility]  ENDOSCOPY;  Service: Cardiovascular;  Laterality: N/A;  . CARDIOVERSION N/A 09/13/2019   Procedure: CARDIOVERSION;  Surgeon: [name], MD;  Location: [facility] ENDOSCOPY;  Service: Cardiovascular;  Laterality: N/A;  . CATARACT EXTRACTION W/ INTRAOCULAR LENS IMPLANT Bilateral   . CHONDROPLASTY  07/22/2011   Procedure: CHONDROPLASTY;  Surgeon: Dione Plover Aluisio;  Location: [place];  Service: Orthopedics;;  . CIRCUMCISION  35 YRS  AGO  . COLONOSCOPY    . CORONARY ANGIOPLASTY  1996-- POST CABG   W/ STENT, last cath 01/07/2012   . CORONARY ARTERY BYPASS GRAFT  1995   X3 VESSEL  . CORONARY STENT PLACEMENT    . KNEE ARTHROSCOPY  07/22/2011   Procedure: ARTHROSCOPY KNEE;  Surgeon: [name];  Location: [place];  Service: Orthopedics;  Laterality: Right;  WITH DEBRIDEMENT   . KNEE ARTHROSCOPY W/ MENISCECTOMY  X2 IN 2002-- RIGHT KNEE  . LAPAROSCOPIC GASTRIC BANDING  01-15-09  . LEFT HEART CATH AND CORONARY ANGIOGRAPHY N/A 04/29/2017   Procedure: LEFT HEART CATH AND CORONARY ANGIOGRAPHY;  Surgeon: [name], MD;  Location: [place] CV LAB;  Service: Cardiovascular;  Laterality: N/A;  . LEFT HEART CATHETERIZATION WITH CORONARY ANGIOGRAM N/A 09/11/2013   Procedure: LEFT HEART CATHETERIZATION WITH CORONARY ANGIOGRAM;  Surgeon: [name], MD;  [facility]: [facility] CATH LAB;  Service: Cardiovascular;  Laterality: N/A;  . MITRAL VALVE REPAIR Right 03/25/2016   Procedure: MINIMALLY INVASIVE REOPERATION FOR MITRAL VALVE REPAIR  (MVR) with size 26 Sorin Memo 3D;  Surgeon: [name], MD;  Location: [place];  Service: Open Heart Surgery;  Laterality: Right;  . RIGHT KNEE ED COMPARTMENT REPLACEMENT  2004  . SYNOVECTOMY  07/22/2011   Procedure: SYNOVECTOMY;  Surgeon: [name];  Location: [place];  Service: Orthopedics;;  . TEE WITHOUT CARDIOVERSION N/A 01/23/2016   Procedure: TRANSESOPHAGEAL ECHOCARDIOGRAM (TEE);  Surgeon: [name],  MD;  Location: [place];  Service: Cardiovascular;  Laterality: N/A;  . TEE WITHOUT CARDIOVERSION N/A 03/25/2016   Procedure: TRANSESOPHAGEAL ECHOCARDIOGRAM (TEE);  Surgeon: [name], MD;  Location: [place];  Service: Open Heart Surgery;  Laterality: N/A;  . UMBILICAL HERNIA REPAIR  01-15-09   W/ GASTRIC BANDING PROCEDURE   Social History   Socioeconomic History  . Marital status: Married    Spouse name: Not on file  . Number of children: 1  . Years of education: Not on file  . Highest education level: Not on file  Occupational History  . Contractor, semi-retired  Social Needs  . Financial resource strain: Not on file  . Food insecurity:    Worry: Not on file    Inability: Not on file  . Transportation needs:    Medical: Not on file    Non-medical: Not on file  Tobacco Use  . Smoking status: Never Smoker  . Smokeless tobacco: Never Used  Substance and Sexual Activity  . Alcohol use: Yes, liquor    Comment: OCCASIONAL  . Drug use: No  . Sexual activity: Not on file  Lifestyle  . Physical activity:    Days per week: Not on file    Minutes per session: Not on file  . Stress: Not on file  Relationships  . Social connections:    Talks on phone: Not on file    Gets together: Not on file    Attends religious service: Not on file    Active member of club or organization: Not on file    Attends meetings of clubs or organizations: Not on file    Relationship status: Not on file  . Intimate partner violence:    Fear of current or ex partner: Not on file    Emotionally abused: Not on file    Physically abused: Not on file    Forced sexual activity: Not on file  Other Topics Concern  . Not on file  Social History Narrative  . Not on file   Current Outpatient Medications on File Prior to Visit  Medication Sig Dispense Refill  . Alirocumab (PRALUENT) 75 MG/ML SOAJ Inject 75 mg into the skin every 14 (fourteen) days.    Marland Kitchen allopurinol (ZYLOPRIM) 100 MG tablet Take 200  mg by mouth daily.     Marland Kitchen  amiodarone (PACERONE) 200 MG tablet Take 1 tablet twice a day for 1 month then reduce to 1 tablet daily 60 tablet 0  . amoxicillin (AMOXIL) 500 MG tablet Take 4 tablets (2 g) by mouth 30-60 minutes prior to dental appointment (Patient taking differently: Take 2,000 mg by mouth See admin instructions. Take 4 tablets (2 g) by mouth 30-60 minutes prior to dental appointment) 12 tablet 0  . apixaban (ELIQUIS) 5 MG TABS tablet Take 1 tablet (5 mg total) by mouth 2 (two) times daily. 180 tablet 3  . Artificial Saliva (MOUTH KOTE MT) Use as directed 4 sprays in the mouth or throat every 3 (three) hours as needed (for dry mouth).    . calcitRIOL (ROCALTROL) 0.25 MCG capsule Take 0.25 mcg by mouth at bedtime.     . carvedilol (COREG) 6.25 MG tablet Take 6.25 mg by mouth daily.    . cetirizine (ZYRTEC) 10 MG tablet Take 10 mg by mouth daily.     . colchicine 0.6 MG tablet Take 0.6-1.2 mg by mouth 2 (two) times daily as needed (gout).     . furosemide (LASIX) 40 MG tablet Take 80 mg by mouth daily.     Marland Kitchen glipiZIDE (GLUCOTROL) 5 MG tablet Take 1 tablet (5 mg total) by mouth daily before supper. (Patient taking differently: Take 5 mg by mouth daily. ) 90 tablet 3  . HYDROcodone-acetaminophen (NORCO/VICODIN) 5-325 MG tablet Take 0.5 tablets by mouth as needed for moderate pain.     . Hypromellose (ARTIFICIAL TEARS OP) Apply 1 drop to eye 2 (two) times daily as needed (dry eyes).    . nitroGLYCERIN (NITROSTAT) 0.4 MG SL tablet PLACE 1 TABLET UNDER THE TONGUE EVERY 5 MINUTES AS NEEDED FOR CHEST PAIN UP TO 3 DOSES, IF SYMPTOMS PERSIST CALL 911 (Patient taking differently: Place 0.4 mg under the tongue every 5 (five) minutes x 3 doses as needed for chest pain. ) 25 tablet 3  . pantoprazole (PROTONIX) 40 MG tablet Take 40 mg by mouth daily as needed (acid reflux/indigestion.).     Marland Kitchen potassium chloride SA (K-DUR,KLOR-CON) 20 MEQ tablet Take 20 mEq by mouth daily.    . Semaglutide,0.25 or  0.5MG /DOS, (OZEMPIC, 0.25 OR 0.5 MG/DOSE,) 2 MG/1.5ML SOPN Inject 0.5 mg into the skin once a week. (Patient taking differently: Inject 0.5 mg into the skin every Thursday. ) 3 pen 3  . sildenafil (VIAGRA) 50 MG tablet Take 1 tablet (50 mg total) by mouth daily as needed for erectile dysfunction. Do not take nitroglycerin with this medication 10 tablet 3  . tadalafil (CIALIS) 20 MG tablet Take 20 mg by mouth daily as needed (erectile dysfunction.).     Marland Kitchen testosterone cypionate (DEPOTESTOSTERONE CYPIONATE) 200 MG/ML injection Inject 400 mg into the muscle once a week.     . traZODone (DESYREL) 100 MG tablet Take 100 mg by mouth at bedtime.     No current facility-administered medications on file prior to visit.   Allergies  Allergen Reactions  . Crestor [Rosuvastatin] Other (See Comments)    Muscle aches   . Lipitor [Atorvastatin] Other (See Comments)    Muscle aches  . Pravastatin Other (See Comments)    Muscle aches  . Codeine Itching and Other (See Comments)    Extremity tingling-- can take synthetic   Family History  Problem Relation Age of Onset  . Other Mother        joint problems  . Hypertension Father   . Heart disease Father   .  CVA Father   . Depression Father   . Heart disease Sister        CABG  . Stroke Sister   . Hypertension Sister   . Congestive Heart Failure Sister   . Diabetes Sister   . Heart disease Brother   . Hypertension Brother   . Congestive Heart Failure Brother     PE: BP 110/80 (BP Location: Right Arm, Patient Position: Sitting, Cuff Size: Normal)   Pulse 80   Ht 5\' 8"  (1.727 m)   Wt 208 lb 12.8 oz (94.7 kg)   SpO2 97%   BMI 31.75 kg/m  Wt Readings from Last 3 Encounters:  09/25/19 208 lb 12.8 oz (94.7 kg)  09/20/19 211 lb (95.7 kg)  09/13/19 205 lb (93 kg)   Constitutional: overweight, in NAD Eyes: PERRLA, EOMI, no exophthalmos ENT: moist mucous membranes, no thyromegaly, no cervical lymphadenopathy Cardiovascular: + Irregularly  irregular, RR, No MRG Respiratory: CTA B Gastrointestinal: abdomen soft, NT, ND, BS+ Musculoskeletal: no deformities, strength intact in all 4 Skin: moist, warm, no rashes Neurological: no tremor with outstretched hands, DTR normal in all 4  ASSESSMENT: 1. DM2, non-insulin-dependent, uncontrolled, with long-term complications - CAD, s/p coronaroplasty, then CABG 1995 - cardiologist - Dr. Aundra Dubin - CHF, EF 67 to 65% - Atrial fibrillation, cardioversion in 2019 - Carotid artery disease - Cerebrovascular disease, s/p TIA - CKD stage V, pending dialysis -Dr. Jimmy Footman - ED  2. HL  3.  Obesity class I  PLAN:  1. Patient with longstanding, uncontrolled, type 2 diabetes, off Metformin due to worsening kidney function.  We cannot use an SGLT2 inhibitor for him due to his poor kidney function and we had a hard time obtaining Ozempic for him, but he is currently on this with good results.  At last visit, HbA1c was much better, at 7.1%.  His sugars decreased but he was not checking very consistently.  They were above target in the morning, whenever he checks and he felt that this was due to having a meal with more starches at night.  We discussed about adding a low-dose glipizide before dinner if he had a large dinner or if he had dessert.  I also advised him to stop pineapple juice and I offered to refer him to nutrition but he declined at that time.  We did not change the dose of Ozempic. -At this visit, sugars have improved in the morning, but some of them are still at goal, related to dietary indiscretions the night before.  For example, in the night when he had ice cream and by the night before, sugars in the morning were in the 180s.  Since overall his sugars have improved, we will continue with the current regimen.  He tells me that he is not taking glipizide before a large dinner, as advised at last visit, but takes it before breakfast in the morning.  For now, we will continue with this practice  but I advised him to gather more data later in the day to make sure he is not dropping his sugars or they do not decrease significantly after dinner. - I suggested to:  Patient Instructions  Please continue: - Glipizide 5 mg before b'fast - Ozempic 0.5 mg weekly   Please return in 4 months with your sugar log.   - we checked his HbA1c: 7.1% (stable) - advised to check sugars at different times of the day - 1x a day, rotating check times - advised for yearly eye  exams >> he is UTD - return to clinic in 4 months   2. HL -Reviewed latest lipid panel from 06/2019: LDL slightly higher than goal, triglycerides high, HDL low Lab Results  Component Value Date   CHOL 149 07/11/2019   HDL 38 (L) 07/11/2019   LDLCALC 73 07/11/2019   TRIG 188 (H) 07/11/2019   CHOLHDL 3.9 07/11/2019  -He had side effects from statins and is currently on Praluent  3.  Obesity class I -Continue Ozempic which should also help with weight loss -He gained 5 pounds since last visit  Philemon Kingdom, MD PhD [facility] Endocrinology

## 2019-09-28 ENCOUNTER — Other Ambulatory Visit: Payer: Self-pay

## 2019-09-28 ENCOUNTER — Encounter (HOSPITAL_COMMUNITY): Payer: Self-pay | Admitting: Nurse Practitioner

## 2019-09-28 ENCOUNTER — Ambulatory Visit (HOSPITAL_COMMUNITY)
Admission: RE | Admit: 2019-09-28 | Discharge: 2019-09-28 | Disposition: A | Discharge status: Home / Self Care | Payer: Medicare Other | Source: Ambulatory Visit | Attending: Nurse Practitioner | Admitting: Nurse Practitioner

## 2019-09-28 VITALS — BP 128/68 | HR 75

## 2019-09-28 DIAGNOSIS — Z79899 Other long term (current) drug therapy: Secondary | ICD-10-CM | POA: Diagnosis not present

## 2019-09-28 DIAGNOSIS — I4819 Other persistent atrial fibrillation: Secondary | ICD-10-CM

## 2019-09-28 DIAGNOSIS — I4891 Unspecified atrial fibrillation: Secondary | ICD-10-CM | POA: Diagnosis not present

## 2019-09-28 LAB — COMPREHENSIVE METABOLIC PANEL
ALT: 11 U/L (ref 0–44)
AST: 11 U/L — ABNORMAL LOW (ref 15–41)
Albumin: 4.1 g/dL (ref 3.5–5.0)
Alkaline Phosphatase: 60 U/L (ref 38–126)
Anion gap: 12 (ref 5–15)
BUN: 37 mg/dL — ABNORMAL HIGH (ref 8–23)
CO2: 27 mmol/L (ref 22–32)
Calcium: 9.8 mg/dL (ref 8.9–10.3)
Chloride: 100 mmol/L (ref 98–111)
Creatinine, Ser: 3.74 mg/dL — ABNORMAL HIGH (ref 0.61–1.24)
GFR calc Af Amer: 17 mL/min — ABNORMAL LOW (ref >60)
GFR calc non Af Amer: 15 mL/min — ABNORMAL LOW (ref >60)
Glucose, Bld: 139 mg/dL — ABNORMAL HIGH (ref 70–99)
Potassium: 3.9 mmol/L (ref 3.5–5.1)
Sodium: 139 mmol/L (ref 135–145)
Total Bilirubin: 0.9 mg/dL (ref 0.3–1.2)
Total Protein: 6.4 g/dL — ABNORMAL LOW (ref 6.5–8.1)

## 2019-09-28 LAB — TSH: TSH: 4.044 u[IU]/mL (ref 0.350–4.500)

## 2019-09-28 NOTE — Progress Notes (Signed)
Pt  for EKG with starting amiodarone 200 mg bid one week ago. Tolerating  ok. Continues in afib rate controlled. Baseline TSH/cmet lab drawn for starting amiodarone. EKG shows afib at 75 bpm, qrs int 118 ms, qtc 475 ms. Will see back in 2 weeks and will get set up for cardioversion.

## 2019-10-11 ENCOUNTER — Encounter (HOSPITAL_COMMUNITY): Payer: Self-pay | Admitting: Nurse Practitioner

## 2019-10-11 ENCOUNTER — Ambulatory Visit (HOSPITAL_COMMUNITY)
Admission: RE | Admit: 2019-10-11 | Discharge: 2019-10-11 | Disposition: A | Discharge status: Home / Self Care | Payer: Medicare Other | Source: Ambulatory Visit | Attending: Nurse Practitioner | Admitting: Nurse Practitioner

## 2019-10-11 ENCOUNTER — Other Ambulatory Visit: Payer: Self-pay

## 2019-10-11 VITALS — BP 110/60 | HR 78 | Ht 68.0 in | Wt 210.4 lb

## 2019-10-11 DIAGNOSIS — G4730 Sleep apnea, unspecified: Secondary | ICD-10-CM | POA: Insufficient documentation

## 2019-10-11 DIAGNOSIS — I480 Paroxysmal atrial fibrillation: Secondary | ICD-10-CM | POA: Diagnosis not present

## 2019-10-11 DIAGNOSIS — I120 Hypertensive chronic kidney disease with stage 5 chronic kidney disease or end stage renal disease: Secondary | ICD-10-CM | POA: Diagnosis not present

## 2019-10-11 DIAGNOSIS — I770 Arteriovenous fistula, acquired: Secondary | ICD-10-CM | POA: Diagnosis not present

## 2019-10-11 DIAGNOSIS — Z79899 Other long term (current) drug therapy: Secondary | ICD-10-CM | POA: Diagnosis not present

## 2019-10-11 DIAGNOSIS — R319 Hematuria, unspecified: Secondary | ICD-10-CM | POA: Diagnosis not present

## 2019-10-11 DIAGNOSIS — I4892 Unspecified atrial flutter: Secondary | ICD-10-CM | POA: Diagnosis not present

## 2019-10-11 DIAGNOSIS — Z01818 Encounter for other preprocedural examination: Secondary | ICD-10-CM | POA: Insufficient documentation

## 2019-10-11 DIAGNOSIS — E785 Hyperlipidemia, unspecified: Secondary | ICD-10-CM | POA: Diagnosis not present

## 2019-10-11 DIAGNOSIS — Z7901 Long term (current) use of anticoagulants: Secondary | ICD-10-CM | POA: Diagnosis not present

## 2019-10-11 DIAGNOSIS — I4819 Other persistent atrial fibrillation: Secondary | ICD-10-CM | POA: Diagnosis not present

## 2019-10-11 DIAGNOSIS — I2510 Atherosclerotic heart disease of native coronary artery without angina pectoris: Secondary | ICD-10-CM | POA: Diagnosis not present

## 2019-10-11 DIAGNOSIS — I5032 Chronic diastolic (congestive) heart failure: Secondary | ICD-10-CM | POA: Diagnosis not present

## 2019-10-11 DIAGNOSIS — Z20822 Contact with and (suspected) exposure to covid-19: Secondary | ICD-10-CM | POA: Insufficient documentation

## 2019-10-11 DIAGNOSIS — N185 Chronic kidney disease, stage 5: Secondary | ICD-10-CM | POA: Diagnosis not present

## 2019-10-11 DIAGNOSIS — E118 Type 2 diabetes mellitus with unspecified complications: Secondary | ICD-10-CM | POA: Insufficient documentation

## 2019-10-11 DIAGNOSIS — I252 Old myocardial infarction: Secondary | ICD-10-CM | POA: Insufficient documentation

## 2019-10-11 DIAGNOSIS — M109 Gout, unspecified: Secondary | ICD-10-CM | POA: Diagnosis not present

## 2019-10-11 DIAGNOSIS — N2581 Secondary hyperparathyroidism of renal origin: Secondary | ICD-10-CM | POA: Diagnosis not present

## 2019-10-11 DIAGNOSIS — I340 Nonrheumatic mitral (valve) insufficiency: Secondary | ICD-10-CM | POA: Diagnosis not present

## 2019-10-11 DIAGNOSIS — Z9889 Other specified postprocedural states: Secondary | ICD-10-CM | POA: Diagnosis not present

## 2019-10-11 DIAGNOSIS — D6869 Other thrombophilia: Secondary | ICD-10-CM

## 2019-10-11 DIAGNOSIS — F4310 Post-traumatic stress disorder, unspecified: Secondary | ICD-10-CM | POA: Insufficient documentation

## 2019-10-11 DIAGNOSIS — E1122 Type 2 diabetes mellitus with diabetic chronic kidney disease: Secondary | ICD-10-CM | POA: Diagnosis not present

## 2019-10-11 DIAGNOSIS — N189 Chronic kidney disease, unspecified: Secondary | ICD-10-CM | POA: Diagnosis not present

## 2019-10-11 DIAGNOSIS — I4891 Unspecified atrial fibrillation: Secondary | ICD-10-CM | POA: Diagnosis not present

## 2019-10-11 DIAGNOSIS — I130 Hypertensive heart and chronic kidney disease with heart failure and stage 1 through stage 4 chronic kidney disease, or unspecified chronic kidney disease: Secondary | ICD-10-CM | POA: Insufficient documentation

## 2019-10-11 DIAGNOSIS — D631 Anemia in chronic kidney disease: Secondary | ICD-10-CM | POA: Diagnosis not present

## 2019-10-11 NOTE — Patient Instructions (Addendum)
Cardioversion scheduled for Thursday, February 25th  - Arrive at the [facility] and go to admitting at [facility] not eat or drink anything after midnight the night prior to your procedure.  - Take all your morning medication with a sip of water prior to arrival.  - You will not be able to drive home after your procedure.

## 2019-10-11 NOTE — H&P (View-Only) (Signed)
Primary Care Physician: [name], MD Referring Physician: Thayer [place] Cardiologist: Dr. Shelba Flake is a 74 y.o. male with a h/o CKD, CAD s/p CABG and obesity s/p lap banding. In 2017, he developed increased exertional dyspnea.  Eventually had a TEE showing severe MR with prolapse of a portion of the anterior mitral valve leaflet.  Coronary angiography in 6/17 showed patent grafts and 90% stenosis of distal diagonal not amenable to intervention.  In 8/17, he had mitral valve repair.  Post-op diastolic CHF, Lasix started and increased.   He developed atrial arrhythmias in 12/18.  Both a. fibrillation and flutter were seen. Saw Dr. Rayann Heman, recommended DCCV,   amiodarone or sotalol if fibrillation recurred shortly after cardioversion.  He had DCCV in 1/19 and  has been in [place] for the last 2 years.Marland Kitchen   He is now in afib clinic as he was seen in the [place] yesterday and was noted to be in rate controlled afib. Was referred here. He continues in rate controlled afib today. He was unaware that he was out of rhythm, but now looking back, he feels that he has had fatigue for around 2 weeks.He does not know of an trigger other than  has had some issues with gout. No  missed anticoagulation for at least 3 weeks with a CHA2DS2VASc score of at least 5.  F/u in afib clinic, 08/2819. He had a successful cardioversion and felt well for the day, then started feeling worse the next day. He is back in rate controlled afib.  F/u in afib clinic, 10/12/19. He has been loading on amiodarone 200 mg bid. He remians in afib with CVR so  will plan for cardioversion. He states no missed doses of eliquis  5 mg bid for the last 3 weeks. He feels very fatigued in afib.   Today, he denies symptoms of palpitations, chest pain, shortness of breath, orthopnea, PND, lower extremity edema, dizziness, presyncope, syncope, or neurologic sequela.+ fatigue. The patient is tolerating medications without difficulties and  is otherwise without complaint today.   Past Medical History:  Diagnosis Date  . Allergic rhinitis   . Anxiety   . Atrial fibrillation ([place])   . Cancer ([place])    hx of skin cancers   . Chronic kidney disease   . Coronary artery disease    a. s/p MI & CABG; b. s/p PCI Diag;  c. Cath 12/2011: LAD diff dzs/small, Diag 75% isr, 90 dist to stent (small), LCX & RCA occluded, VG->OM 40, VG->PDA patent - med rx.  . Depression    PTSD  . Diabetes mellitus    not on medications  . Diastolic CHF, chronic (HCC)    a. EF 55-60% by echo 2007  . Dysrhythmia    "abnormal beat"  . GERD (gastroesophageal reflux disease)    "not anymore" " I had a lap band"  . History of diverticulitis of colon    5 YRS AGO  . History of pneumonia    2017  . History of transient ischemic attack (TIA)    CAROTID DOPPLERS NOV 2011  0-39& BIL. STENOSIS  . Hyperlipidemia   . Hypertension   . Mitral regurgitation   . Myocardial infarction ([place])   . Neuromuscular disorder ([place])    left arm numbness   . 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 PTSD (post-traumatic stress disorder)    from [place]  .  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 Shortness of breath dyspnea    with exertion  . Sleep apnea    cpap- see ov note in [facility] 05/12/11 for settings    Past Surgical History:  Procedure Laterality Date  . AV FISTULA PLACEMENT Left 08/02/2018   Procedure: ARTERIOVENOUS (AV) FISTULA CREATION RADIOCEPHALIC;  Surgeon: Angelia Mould, MD;  Location: [place];  Service: Vascular;  Laterality: Left;  . BLEPHAROPLASTY  1985   BILATERAL  . CARDIAC CATHETERIZATION  2001, 2004, 2009, 2011  . CARDIAC CATHETERIZATION N/A 01/23/2016   Procedure: Right/Left Heart Cath and Coronary  Angiography;  Surgeon: [name], MD;  Location: [place] CV LAB;  Service: Cardiovascular;  Laterality: N/A;  . CARDIOVERSION N/A 09/07/2017   Procedure: CARDIOVERSION;  Surgeon: [name], MD;  Location: [facility] ENDOSCOPY;  Service: Cardiovascular;  Laterality: N/A;  . CARDIOVERSION N/A 09/13/2019   Procedure: CARDIOVERSION;  Surgeon: [name], MD;  Location[facility] ENDOSCOPY;  Service: Cardiovascular;  Laterality: N/A;  . CATARACT EXTRACTION W/ INTRAOCULAR LENS IMPLANT Bilateral   . CHONDROPLASTY  07/22/2011   Procedure: CHONDROPLASTY;  Surgeon: Dione Plover Aluisio;  Location: [place];  Service: Orthopedics;;  . CIRCUMCISION  35 YRS  AGO  . COLONOSCOPY    . CORONARY ANGIOPLASTY  1996-- POST CABG   W/ STENT, last cath 01/07/2012   . CORONARY ARTERY BYPASS GRAFT  1995   X3 VESSEL  . CORONARY STENT PLACEMENT    . KNEE ARTHROSCOPY  07/22/2011   Procedure: ARTHROSCOPY KNEE;  Surgeon: [name];  Location: [place];  Service: Orthopedics;  Laterality: Right;  WITH DEBRIDEMENT   . KNEE ARTHROSCOPY W/ MENISCECTOMY  X2 IN 2002-- RIGHT KNEE  . LAPAROSCOPIC GASTRIC BANDING  01-15-09  . LEFT HEART CATH AND CORONARY ANGIOGRAPHY N/A 04/29/2017   Procedure: LEFT HEART CATH AND CORONARY ANGIOGRAPHY;  Surgeon: [name], MD;  Location: [place] CV LAB;  Service: Cardiovascular;  Laterality: N/A;  . LEFT HEART CATHETERIZATION WITH CORONARY ANGIOGRAM N/A 09/11/2013   Procedure: LEFT HEART CATHETERIZATION WITH CORONARY ANGIOGRAM;  Surgeon: [name], MD;  Location[facility] CATH LAB;  Service: Cardiovascular;  Laterality: N/A;  . MITRAL VALVE REPAIR Right 03/25/2016   Procedure: MINIMALLY INVASIVE REOPERATION FOR MITRAL VALVE REPAIR  (MVR) with size 26 Sorin Memo 3D;  Surgeon: [name], MD;  Location: [place];  Service: Open Heart Surgery;  Laterality: Right;  . RIGHT KNEE ED COMPARTMENT REPLACEMENT  2004  . SYNOVECTOMY  07/22/2011   Procedure:  SYNOVECTOMY;  Surgeon: [name];  Location: [place];  Service: Orthopedics;;  . TEE WITHOUT CARDIOVERSION N/A 01/23/2016   Procedure: TRANSESOPHAGEAL ECHOCARDIOGRAM (TEE);  Surgeon: [name], MD;  Location: [place];  Service: Cardiovascular;  Laterality: N/A;  . TEE WITHOUT CARDIOVERSION N/A 03/25/2016   Procedure: TRANSESOPHAGEAL ECHOCARDIOGRAM (TEE);  Surgeon: [name], MD;  Location: [place];  Service: Open Heart Surgery;  Laterality: N/A;  . UMBILICAL HERNIA REPAIR  01-15-09   W/ GASTRIC BANDING PROCEDURE    Current Outpatient Medications  Medication Sig Dispense Refill  . Alirocumab (PRALUENT) 75 MG/ML SOAJ Inject 75 mg into the skin every 14 (fourteen) days.    Marland Kitchen allopurinol (ZYLOPRIM) 100 MG tablet Take 200 mg by mouth daily.     Marland Kitchen  amiodarone (PACERONE) 200 MG tablet Take 1 tablet twice a day for 1 month then reduce to 1 tablet daily 60 tablet 0  . amoxicillin (AMOXIL) 500 MG tablet Take 4 tablets (2 g) by mouth 30-60 minutes prior to dental appointment (Patient taking differently: Take 2,000 mg by mouth See admin instructions. Take 4 tablets (2 g) by mouth 30-60 minutes prior to dental appointment) 12 tablet 0  . apixaban (ELIQUIS) 5 MG TABS tablet Take 1 tablet (5 mg total) by mouth 2 (two) times daily. 180 tablet 3  . Artificial Saliva (MOUTH KOTE MT) Use as directed 4 sprays in the mouth or throat every 3 (three) hours as needed (for dry mouth).    . calcitRIOL (ROCALTROL) 0.25 MCG capsule Take 0.25 mcg by mouth at bedtime.     . carvedilol (COREG) 6.25 MG tablet Take 6.25 mg by mouth daily.    . cetirizine (ZYRTEC) 10 MG tablet Take 10 mg by mouth daily.     . colchicine 0.6 MG tablet Take 0.6-1.2 mg by mouth 2 (two) times daily as needed (gout).     . furosemide (LASIX) 40 MG tablet Take 80 mg by mouth every morning.     Marland Kitchen glipiZIDE (GLUCOTROL) 5 MG tablet Take 1 tablet (5 mg total) by mouth daily before supper. (Patient taking differently:  Take 5 mg by mouth daily. ) 90 tablet 3  . HYDROcodone-acetaminophen (NORCO/VICODIN) 5-325 MG tablet Take 0.5 tablets by mouth as needed for moderate pain.     . Hypromellose (ARTIFICIAL TEARS OP) Apply 1 drop to eye as needed (dry eyes).     . nitroGLYCERIN (NITROSTAT) 0.4 MG SL tablet PLACE 1 TABLET UNDER THE TONGUE EVERY 5 MINUTES AS NEEDED FOR CHEST PAIN UP TO 3 DOSES, IF SYMPTOMS PERSIST CALL 911 (Patient taking differently: Place 0.4 mg under the tongue every 5 (five) minutes x 3 doses as needed for chest pain. ) 25 tablet 3  . pantoprazole (PROTONIX) 40 MG tablet Take 40 mg by mouth daily as needed (acid reflux/indigestion.).     Marland Kitchen potassium chloride SA (K-DUR,KLOR-CON) 20 MEQ tablet Take 20 mEq by mouth daily.    . Semaglutide,0.25 or 0.5MG /DOS, (OZEMPIC, 0.25 OR 0.5 MG/DOSE,) 2 MG/1.5ML SOPN Inject 0.5 mg into the skin once a week. (Patient taking differently: Inject 0.5 mg into the skin every Thursday. ) 3 pen 3  . sildenafil (VIAGRA) 50 MG tablet Take 1 tablet (50 mg total) by mouth daily as needed for erectile dysfunction. Do not take nitroglycerin with this medication 10 tablet 3  . tadalafil (CIALIS) 20 MG tablet Take 20 mg by mouth daily as needed (erectile dysfunction.).     Marland Kitchen testosterone cypionate (DEPOTESTOSTERONE CYPIONATE) 200 MG/ML injection Inject 400 mg into the muscle once a week.     . traZODone (DESYREL) 100 MG tablet Take 100 mg by mouth at bedtime.     No current facility-administered medications for this encounter.    Allergies  Allergen Reactions  . Crestor [Rosuvastatin] Other (See Comments)    Muscle aches   . Lipitor [Atorvastatin] Other (See Comments)    Muscle aches  . Pravastatin Other (See Comments)    Muscle aches  . Codeine Itching and Other (See Comments)    Extremity tingling-- can take synthetic    Social History   Socioeconomic History  . Marital status: Married    Spouse name: Not on file  . Number of children: Not on file  . Years of  education: Not  on file  . Highest education level: Not on file  Occupational History  . Not on file  Tobacco Use  . Smoking status: Never Smoker  . Smokeless tobacco: Never Used  Substance and Sexual Activity  . Alcohol use: Yes    Alcohol/week: 2.0 standard drinks    Types: 1 Glasses of wine, 1 Shots of liquor per week    Comment: OCCASIONAL  . Drug use: No  . Sexual activity: Not on file  Other Topics Concern  . Not on file  Social History Narrative  . Not on file   Social Determinants of Health   Financial Resource Strain:   . Difficulty of Paying Living Expenses: Not on file  Food Insecurity:   . Worried About Charity fundraiser in the Last Year: Not on file  . Ran Out of Food in the Last Year: Not on file  Transportation Needs:   . Lack of Transportation (Medical): Not on file  . Lack of Transportation (Non-Medical): Not on file  Physical Activity:   . Days of Exercise per Week: Not on file  . Minutes of Exercise per Session: Not on file  Stress:   . Feeling of Stress : Not on file  Social Connections:   . Frequency of Communication with Friends and Family: Not on file  . Frequency of Social Gatherings with Friends and Family: Not on file  . Attends Religious Services: Not on file  . Active Member of Clubs or Organizations: Not on file  . Attends Archivist Meetings: Not on file  . Marital Status: Not on file  Intimate Partner Violence:   . Fear of Current or Ex-Partner: Not on file  . Emotionally Abused: Not on file  . Physically Abused: Not on file  . Sexually Abused: Not on file    Family History  Problem Relation Age of Onset  . Other Mother        joint problems  . Hypertension Father   . Heart disease Father   . CVA Father   . Depression Father   . Heart disease Sister        CABG  . Stroke Sister   . Hypertension Sister   . Congestive Heart Failure Sister   . Diabetes Sister   . Heart disease Brother   . Hypertension Brother   .  Congestive Heart Failure Brother     ROS- All systems are reviewed and negative except as per the HPI above  Physical Exam: There were no vitals filed for this visit. Wt Readings from Last 3 Encounters:  09/25/19 94.7 kg  09/20/19 95.7 kg  09/13/19 93 kg    Labs: Lab Results  Component Value Date   NA 139 09/28/2019   K 3.9 09/28/2019   CL 100 09/28/2019   CO2 27 09/28/2019   GLUCOSE 139 (H) 09/28/2019   BUN 37 (H) 09/28/2019   CREATININE 3.74 (H) 09/28/2019   CALCIUM 9.8 09/28/2019   MG 2.2 03/26/2016   Lab Results  Component Value Date   INR 1.13 08/02/2018   Lab Results  Component Value Date   CHOL 149 07/11/2019   HDL 38 (L) 07/11/2019   LDLCALC 73 07/11/2019   TRIG 188 (H) 07/11/2019     GEN- The patient is well appearing, alert and oriented x 3 today.   Head- normocephalic, atraumatic Eyes-  Sclera clear, conjunctiva pink Ears- hearing intact Oropharynx- clear Neck- supple, no JVP Lymph- no cervical lymphadenopathy Lungs- Clear to  ausculation bilaterally, normal work of breathing Heart- irregular rate and rhythm, no murmurs, rubs or gallops, PMI not laterally displaced GI- soft, NT, ND, + BS Extremities- no clubbing, cyanosis, or edema MS- no significant deformity or atrophy Skin- no rash or lesion Psych- euthymic mood, full affect Neuro- strength and sensation are intact  EKG-afib at 78 bpm, qtc 481 ms    Assessment and Plan: 1. Paroxysmal afib with CVR ? present for the last 5-6 weeks Successful cardioversion but ERAF Not  an ablation candidate because of left atrium severely dilated  Discussed with pt and Dr. Aundra Dubin, only option for antiarrythmic  with crcl cal at 25.80 would be amiodarone  He has now been loading long enough on amiodarone and have scheduled cardioversion 10/19/19 with Dr. Aundra Dubin  Risk vrs benefit discussed in  detail with pt  Continue amiodarone 200 mg bid until  f/u with Dr. Aundra Dubin and will need amiodarone  decreased to  200 mg daily at that time    Continue coreg 6.25 mg qd   2. CHA2DS2VASc score of 5 Continue eliquis 5 mg bid States no missed doses x at least 3 weeks   3. CHF  Weight stable   F/u  with Dr. Aundra Dubin as scheduled 10/31/19 Cbc/bmet drawn today with Dr. Jimmy Footman today and will request results Covid test scheduled for Monday 2/22  Butch Penny C. Averi Cacioppo, [facility] [address] [place], [place] [address] [phone]

## 2019-10-11 NOTE — Progress Notes (Signed)
Primary Care Physician: [name], MD Referring Physician: Thayer [place] Cardiologist: Dr. Shelba Flake is a 74 y.o. male with a h/o CKD, CAD s/p CABG and obesity s/p lap banding. In 2017, he developed increased exertional dyspnea.  Eventually had a TEE showing severe MR with prolapse of a portion of the anterior mitral valve leaflet.  Coronary angiography in 6/17 showed patent grafts and 90% stenosis of distal diagonal not amenable to intervention.  In 8/17, he had mitral valve repair.  Post-op diastolic CHF, Lasix started and increased.   He developed atrial arrhythmias in 12/18.  Both a. fibrillation and flutter were seen. Saw Dr. Rayann Heman, recommended DCCV,   amiodarone or sotalol if fibrillation recurred shortly after cardioversion.  He had DCCV in 1/19 and  has been in [place] for the last 2 years.[name]   He is now in afib clinic as he was seen in the [place] yesterday and was noted to be in rate controlled afib. Was referred here. He continues in rate controlled afib today. He was unaware that he was out of rhythm, but now looking back, he feels that he has had fatigue for around 2 weeks.He does not know of an trigger other than  has had some issues with gout. No  missed anticoagulation for at least 3 weeks with a CHA2DS2VASc score of at least 5.  F/u in afib clinic, 08/2819. He had a successful cardioversion and felt well for the day, then started feeling worse the next day. He is back in rate controlled afib.  F/u in afib clinic, 10/12/19. He has been loading on amiodarone 200 mg bid. He remians in afib with CVR so  will plan for cardioversion. He states no missed doses of eliquis  5 mg bid for the last 3 weeks. He feels very fatigued in afib.   Today, he denies symptoms of palpitations, chest pain, shortness of breath, orthopnea, PND, lower extremity edema, dizziness, presyncope, syncope, or neurologic sequela.+ fatigue. The patient is tolerating medications without difficulties and  is otherwise without complaint today.   Past Medical History:  Diagnosis Date  . Allergic rhinitis   . Anxiety   . Atrial fibrillation ([place])   . Cancer ([place])    hx of skin cancers   . Chronic kidney disease   . Coronary artery disease    a. s/p MI & CABG; b. s/p PCI Diag;  c. Cath 12/2011: LAD diff dzs/small, Diag 75% isr, 90 dist to stent (small), LCX & RCA occluded, VG->OM 40, VG->PDA patent - med rx.  . Depression    PTSD  . Diabetes mellitus    not on medications  . Diastolic CHF, chronic (HCC)    a. EF 55-60% by echo 2007  . Dysrhythmia    "abnormal beat"  . GERD (gastroesophageal reflux disease)    "not anymore" " I had a lap band"  . History of diverticulitis of colon    5 YRS AGO  . History of pneumonia    2017  . History of transient ischemic attack (TIA)    CAROTID DOPPLERS NOV 2011  0-39& BIL. STENOSIS  . Hyperlipidemia   . Hypertension   . Mitral regurgitation   . Myocardial infarction ([place])   . Neuromuscular disorder ([place])    left arm numbness   . 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 PTSD (post-traumatic stress disorder)    from [place]  .  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 Shortness of breath dyspnea    with exertion  . Sleep apnea    cpap- see ov note in [facility] 05/12/11 for settings    Past Surgical History:  Procedure Laterality Date  . AV FISTULA PLACEMENT Left 08/02/2018   Procedure: ARTERIOVENOUS (AV) FISTULA CREATION RADIOCEPHALIC;  Surgeon: Angelia Mould, MD;  Location: [place];  Service: Vascular;  Laterality: Left;  . BLEPHAROPLASTY  1985   BILATERAL  . CARDIAC CATHETERIZATION  2001, 2004, 2009, 2011  . CARDIAC CATHETERIZATION N/A 01/23/2016   Procedure: Right/Left Heart Cath and Coronary  Angiography;  Surgeon: [name], MD;  Location: [place] CV LAB;  Service: Cardiovascular;  Laterality: N/A;  . CARDIOVERSION N/A 09/07/2017   Procedure: CARDIOVERSION;  Surgeon: [name], MD;  Location: [facility] ENDOSCOPY;  Service: Cardiovascular;  Laterality: N/A;  . CARDIOVERSION N/A 09/13/2019   Procedure: CARDIOVERSION;  Surgeon: [name], MD;  Location: [facility] ENDOSCOPY;  Service: Cardiovascular;  Laterality: N/A;  . CATARACT EXTRACTION W/ INTRAOCULAR LENS IMPLANT Bilateral   . CHONDROPLASTY  07/22/2011   Procedure: CHONDROPLASTY;  Surgeon: Dione Plover Aluisio;  Location: [place];  Service: Orthopedics;;  . CIRCUMCISION  35 YRS  AGO  . COLONOSCOPY    . CORONARY ANGIOPLASTY  1996-- POST CABG   W/ STENT, last cath 01/07/2012   . CORONARY ARTERY BYPASS GRAFT  1995   X3 VESSEL  . CORONARY STENT PLACEMENT    . KNEE ARTHROSCOPY  07/22/2011   Procedure: ARTHROSCOPY KNEE;  Surgeon: [name];  Location: [place];  Service: Orthopedics;  Laterality: Right;  WITH DEBRIDEMENT   . KNEE ARTHROSCOPY W/ MENISCECTOMY  X2 IN 2002-- RIGHT KNEE  . LAPAROSCOPIC GASTRIC BANDING  01-15-09  . LEFT HEART CATH AND CORONARY ANGIOGRAPHY N/A 04/29/2017   Procedure: LEFT HEART CATH AND CORONARY ANGIOGRAPHY;  Surgeon: [name], MD;  Location: [place] CV LAB;  Service: Cardiovascular;  Laterality: N/A;  . LEFT HEART CATHETERIZATION WITH CORONARY ANGIOGRAM N/A 09/11/2013   Procedure: LEFT HEART CATHETERIZATION WITH CORONARY ANGIOGRAM;  Surgeon: [name], MD;  Location: [facility] CATH LAB;  Service: Cardiovascular;  Laterality: N/A;  . MITRAL VALVE REPAIR Right 03/25/2016   Procedure: MINIMALLY INVASIVE REOPERATION FOR MITRAL VALVE REPAIR  (MVR) with size 26 Sorin Memo 3D;  Surgeon: [name], MD;  Location: [place];  Service: Open Heart Surgery;  Laterality: Right;  . RIGHT KNEE ED COMPARTMENT REPLACEMENT  2004  . SYNOVECTOMY  07/22/2011   Procedure:  SYNOVECTOMY;  Surgeon: [name];  Location: [place];  Service: Orthopedics;;  . TEE WITHOUT CARDIOVERSION N/A 01/23/2016   Procedure: TRANSESOPHAGEAL ECHOCARDIOGRAM (TEE);  Surgeon: [name], MD;  Location: [place];  Service: Cardiovascular;  Laterality: N/A;  . TEE WITHOUT CARDIOVERSION N/A 03/25/2016   Procedure: TRANSESOPHAGEAL ECHOCARDIOGRAM (TEE);  Surgeon: [name], MD;  Location: [place];  Service: Open Heart Surgery;  Laterality: N/A;  . UMBILICAL HERNIA REPAIR  01-15-09   W/ GASTRIC BANDING PROCEDURE    Current Outpatient Medications  Medication Sig Dispense Refill  . Alirocumab (PRALUENT) 75 MG/ML SOAJ Inject 75 mg into the skin every 14 (fourteen) days.    [name] allopurinol (ZYLOPRIM) 100 MG tablet Take 200 mg by mouth daily.     [name]  amiodarone (PACERONE) 200 MG tablet Take 1 tablet twice a day for 1 month then reduce to 1 tablet daily 60 tablet 0  . amoxicillin (AMOXIL) 500 MG tablet Take 4 tablets (2 g) by mouth 30-60 minutes prior to dental appointment (Patient taking differently: Take 2,000 mg by mouth See admin instructions. Take 4 tablets (2 g) by mouth 30-60 minutes prior to dental appointment) 12 tablet 0  . apixaban (ELIQUIS) 5 MG TABS tablet Take 1 tablet (5 mg total) by mouth 2 (two) times daily. 180 tablet 3  . Artificial Saliva (MOUTH KOTE MT) Use as directed 4 sprays in the mouth or throat every 3 (three) hours as needed (for dry mouth).    . calcitRIOL (ROCALTROL) 0.25 MCG capsule Take 0.25 mcg by mouth at bedtime.     . carvedilol (COREG) 6.25 MG tablet Take 6.25 mg by mouth daily.    . cetirizine (ZYRTEC) 10 MG tablet Take 10 mg by mouth daily.     . colchicine 0.6 MG tablet Take 0.6-1.2 mg by mouth 2 (two) times daily as needed (gout).     . furosemide (LASIX) 40 MG tablet Take 80 mg by mouth every morning.     [name] glipiZIDE (GLUCOTROL) 5 MG tablet Take 1 tablet (5 mg total) by mouth daily before supper. (Patient taking differently:  Take 5 mg by mouth daily. ) 90 tablet 3  . HYDROcodone-acetaminophen (NORCO/VICODIN) 5-325 MG tablet Take 0.5 tablets by mouth as needed for moderate pain.     . Hypromellose (ARTIFICIAL TEARS OP) Apply 1 drop to eye as needed (dry eyes).     . nitroGLYCERIN (NITROSTAT) 0.4 MG SL tablet PLACE 1 TABLET UNDER THE TONGUE EVERY 5 MINUTES AS NEEDED FOR CHEST PAIN UP TO 3 DOSES, IF SYMPTOMS PERSIST CALL 911 (Patient taking differently: Place 0.4 mg under the tongue every 5 (five) minutes x 3 doses as needed for chest pain. ) 25 tablet 3  . pantoprazole (PROTONIX) 40 MG tablet Take 40 mg by mouth daily as needed (acid reflux/indigestion.).     [name] potassium chloride SA (K-DUR,KLOR-CON) 20 MEQ tablet Take 20 mEq by mouth daily.    . Semaglutide,0.25 or 0.5MG /DOS, (OZEMPIC, 0.25 OR 0.5 MG/DOSE,) 2 MG/1.5ML SOPN Inject 0.5 mg into the skin once a week. (Patient taking differently: Inject 0.5 mg into the skin every Thursday. ) 3 pen 3  . sildenafil (VIAGRA) 50 MG tablet Take 1 tablet (50 mg total) by mouth daily as needed for erectile dysfunction. Do not take nitroglycerin with this medication 10 tablet 3  . tadalafil (CIALIS) 20 MG tablet Take 20 mg by mouth daily as needed (erectile dysfunction.).     [name] testosterone cypionate (DEPOTESTOSTERONE CYPIONATE) 200 MG/ML injection Inject 400 mg into the muscle once a week.     . traZODone (DESYREL) 100 MG tablet Take 100 mg by mouth at bedtime.     No current facility-administered medications for this encounter.    Allergies  Allergen Reactions  . Crestor [Rosuvastatin] Other (See Comments)    Muscle aches   . Lipitor [Atorvastatin] Other (See Comments)    Muscle aches  . Pravastatin Other (See Comments)    Muscle aches  . Codeine Itching and Other (See Comments)    Extremity tingling-- can take synthetic    Social History   Socioeconomic History  . Marital status: Married    Spouse name: Not on file  . Number of children: Not on file  . Years of  education: Not  on file  . Highest education level: Not on file  Occupational History  . Not on file  Tobacco Use  . Smoking status: Never Smoker  . Smokeless tobacco: Never Used  Substance and Sexual Activity  . Alcohol use: Yes    Alcohol/week: 2.0 standard drinks    Types: 1 Glasses of wine, 1 Shots of liquor per week    Comment: OCCASIONAL  . Drug use: No  . Sexual activity: Not on file  Other Topics Concern  . Not on file  Social History Narrative  . Not on file   Social Determinants of Health   Financial Resource Strain:   . Difficulty of Paying Living Expenses: Not on file  Food Insecurity:   . Worried About Charity fundraiser in the Last Year: Not on file  . Ran Out of Food in the Last Year: Not on file  Transportation Needs:   . Lack of Transportation (Medical): Not on file  . Lack of Transportation (Non-Medical): Not on file  Physical Activity:   . Days of Exercise per Week: Not on file  . Minutes of Exercise per Session: Not on file  Stress:   . Feeling of Stress : Not on file  Social Connections:   . Frequency of Communication with Friends and Family: Not on file  . Frequency of Social Gatherings with Friends and Family: Not on file  . Attends Religious Services: Not on file  . Active Member of Clubs or Organizations: Not on file  . Attends Archivist Meetings: Not on file  . Marital Status: Not on file  Intimate Partner Violence:   . Fear of Current or Ex-Partner: Not on file  . Emotionally Abused: Not on file  . Physically Abused: Not on file  . Sexually Abused: Not on file    Family History  Problem Relation Age of Onset  . Other Mother        joint problems  . Hypertension Father   . Heart disease Father   . CVA Father   . Depression Father   . Heart disease Sister        CABG  . Stroke Sister   . Hypertension Sister   . Congestive Heart Failure Sister   . Diabetes Sister   . Heart disease Brother   . Hypertension Brother   .  Congestive Heart Failure Brother     ROS- All systems are reviewed and negative except as per the HPI above  Physical Exam: There were no vitals filed for this visit. Wt Readings from Last 3 Encounters:  09/25/19 94.7 kg  09/20/19 95.7 kg  09/13/19 93 kg    Labs: Lab Results  Component Value Date   NA 139 09/28/2019   K 3.9 09/28/2019   CL 100 09/28/2019   CO2 27 09/28/2019   GLUCOSE 139 (H) 09/28/2019   BUN 37 (H) 09/28/2019   CREATININE 3.74 (H) 09/28/2019   CALCIUM 9.8 09/28/2019   MG 2.2 03/26/2016   Lab Results  Component Value Date   INR 1.13 08/02/2018   Lab Results  Component Value Date   CHOL 149 07/11/2019   HDL 38 (L) 07/11/2019   LDLCALC 73 07/11/2019   TRIG 188 (H) 07/11/2019     GEN- The patient is well appearing, alert and oriented x 3 today.   Head- normocephalic, atraumatic Eyes-  Sclera clear, conjunctiva pink Ears- hearing intact Oropharynx- clear Neck- supple, no JVP Lymph- no cervical lymphadenopathy Lungs- Clear to  ausculation bilaterally, normal work of breathing Heart- irregular rate and rhythm, no murmurs, rubs or gallops, PMI not laterally displaced GI- soft, NT, ND, + BS Extremities- no clubbing, cyanosis, or edema MS- no significant deformity or atrophy Skin- no rash or lesion Psych- euthymic mood, full affect Neuro- strength and sensation are intact  EKG-afib at 78 bpm, qtc 481 ms    Assessment and Plan: 1. Paroxysmal afib with CVR ? present for the last 5-6 weeks Successful cardioversion but ERAF Not  an ablation candidate because of left atrium severely dilated  Discussed with pt and Dr. Aundra Dubin, only option for antiarrythmic  with crcl cal at 25.80 would be amiodarone  He has now been loading long enough on amiodarone and have scheduled cardioversion 10/19/19 with Dr. Aundra Dubin  Risk vrs benefit discussed in  detail with pt  Continue amiodarone 200 mg bid until  f/u with Dr. Aundra Dubin and will need amiodarone  decreased to  200 mg daily at that time    Continue coreg 6.25 mg qd   2. CHA2DS2VASc score of 5 Continue eliquis 5 mg bid States no missed doses x at least 3 weeks   3. CHF  Weight stable   F/u  with Dr. Aundra Dubin as scheduled 10/31/19 Cbc/bmet drawn today with Dr. Jimmy Footman today and will request results Covid test scheduled for Monday 2/22  Butch Penny C. Ragen Laver, [facility] [address] [place], [place] [address] [phone]

## 2019-10-12 ENCOUNTER — Ambulatory Visit (HOSPITAL_COMMUNITY): Payer: Medicare Other | Admitting: Nurse Practitioner

## 2019-10-14 ENCOUNTER — Other Ambulatory Visit (HOSPITAL_COMMUNITY): Payer: Self-pay | Admitting: Nurse Practitioner

## 2019-10-16 ENCOUNTER — Other Ambulatory Visit (HOSPITAL_COMMUNITY)
Admission: RE | Admit: 2019-10-16 | Discharge: 2019-10-16 | Disposition: A | Discharge status: Home / Self Care | Payer: Medicare Other | Source: Ambulatory Visit | Attending: Cardiology | Admitting: Cardiology

## 2019-10-16 DIAGNOSIS — Z01812 Encounter for preprocedural laboratory examination: Secondary | ICD-10-CM | POA: Insufficient documentation

## 2019-10-16 DIAGNOSIS — Z20822 Contact with and (suspected) exposure to covid-19: Secondary | ICD-10-CM | POA: Insufficient documentation

## 2019-10-16 LAB — SARS CORONAVIRUS 2 (TAT 6-24 HRS): SARS Coronavirus 2: NEGATIVE

## 2019-10-17 ENCOUNTER — Other Ambulatory Visit (HOSPITAL_COMMUNITY): Payer: Self-pay | Admitting: *Deleted

## 2019-10-17 MED ORDER — AMIODARONE HCL 200 MG PO TABS: ORAL_TABLET | ORAL | 0 refills | Status: DC

## 2019-10-19 ENCOUNTER — Ambulatory Visit (HOSPITAL_COMMUNITY)
Admission: RE | Admit: 2019-10-19 | Discharge: 2019-10-19 | Disposition: A | Discharge status: Home / Self Care | Payer: Medicare Other | Attending: Cardiology | Admitting: Cardiology

## 2019-10-19 ENCOUNTER — Other Ambulatory Visit: Payer: Self-pay

## 2019-10-19 ENCOUNTER — Encounter (HOSPITAL_COMMUNITY)
Admission: RE | Disposition: A | Discharge status: Home / Self Care | Payer: Self-pay | Source: Home / Self Care | Attending: Cardiology

## 2019-10-19 ENCOUNTER — Ambulatory Visit (HOSPITAL_COMMUNITY): Payer: Medicare Other | Admitting: Anesthesiology

## 2019-10-19 DIAGNOSIS — I5032 Chronic diastolic (congestive) heart failure: Secondary | ICD-10-CM | POA: Insufficient documentation

## 2019-10-19 DIAGNOSIS — M1711 Unilateral primary osteoarthritis, right knee: Secondary | ICD-10-CM | POA: Diagnosis not present

## 2019-10-19 DIAGNOSIS — I4891 Unspecified atrial fibrillation: Secondary | ICD-10-CM | POA: Diagnosis not present

## 2019-10-19 DIAGNOSIS — G4730 Sleep apnea, unspecified: Secondary | ICD-10-CM | POA: Insufficient documentation

## 2019-10-19 DIAGNOSIS — I2510 Atherosclerotic heart disease of native coronary artery without angina pectoris: Secondary | ICD-10-CM | POA: Diagnosis not present

## 2019-10-19 DIAGNOSIS — J309 Allergic rhinitis, unspecified: Secondary | ICD-10-CM | POA: Diagnosis not present

## 2019-10-19 DIAGNOSIS — Z888 Allergy status to other drugs, medicaments and biological substances status: Secondary | ICD-10-CM | POA: Diagnosis not present

## 2019-10-19 DIAGNOSIS — N189 Chronic kidney disease, unspecified: Secondary | ICD-10-CM | POA: Insufficient documentation

## 2019-10-19 DIAGNOSIS — I480 Paroxysmal atrial fibrillation: Secondary | ICD-10-CM | POA: Diagnosis not present

## 2019-10-19 DIAGNOSIS — I130 Hypertensive heart and chronic kidney disease with heart failure and stage 1 through stage 4 chronic kidney disease, or unspecified chronic kidney disease: Secondary | ICD-10-CM | POA: Insufficient documentation

## 2019-10-19 DIAGNOSIS — I252 Old myocardial infarction: Secondary | ICD-10-CM | POA: Insufficient documentation

## 2019-10-19 DIAGNOSIS — Z833 Family history of diabetes mellitus: Secondary | ICD-10-CM | POA: Insufficient documentation

## 2019-10-19 DIAGNOSIS — Z95828 Presence of other vascular implants and grafts: Secondary | ICD-10-CM | POA: Insufficient documentation

## 2019-10-19 DIAGNOSIS — Z7984 Long term (current) use of oral hypoglycemic drugs: Secondary | ICD-10-CM | POA: Insufficient documentation

## 2019-10-19 DIAGNOSIS — Z79899 Other long term (current) drug therapy: Secondary | ICD-10-CM | POA: Diagnosis not present

## 2019-10-19 DIAGNOSIS — Z7901 Long term (current) use of anticoagulants: Secondary | ICD-10-CM | POA: Insufficient documentation

## 2019-10-19 DIAGNOSIS — Z8673 Personal history of transient ischemic attack (TIA), and cerebral infarction without residual deficits: Secondary | ICD-10-CM | POA: Insufficient documentation

## 2019-10-19 DIAGNOSIS — Z823 Family history of stroke: Secondary | ICD-10-CM | POA: Insufficient documentation

## 2019-10-19 DIAGNOSIS — Z885 Allergy status to narcotic agent status: Secondary | ICD-10-CM | POA: Diagnosis not present

## 2019-10-19 DIAGNOSIS — Z8249 Family history of ischemic heart disease and other diseases of the circulatory system: Secondary | ICD-10-CM | POA: Insufficient documentation

## 2019-10-19 DIAGNOSIS — E785 Hyperlipidemia, unspecified: Secondary | ICD-10-CM | POA: Diagnosis not present

## 2019-10-19 DIAGNOSIS — E1140 Type 2 diabetes mellitus with diabetic neuropathy, unspecified: Secondary | ICD-10-CM | POA: Diagnosis not present

## 2019-10-19 DIAGNOSIS — E7800 Pure hypercholesterolemia, unspecified: Secondary | ICD-10-CM | POA: Diagnosis not present

## 2019-10-19 DIAGNOSIS — Z951 Presence of aortocoronary bypass graft: Secondary | ICD-10-CM | POA: Insufficient documentation

## 2019-10-19 DIAGNOSIS — E669 Obesity, unspecified: Secondary | ICD-10-CM | POA: Diagnosis not present

## 2019-10-19 DIAGNOSIS — E1122 Type 2 diabetes mellitus with diabetic chronic kidney disease: Secondary | ICD-10-CM | POA: Insufficient documentation

## 2019-10-19 DIAGNOSIS — Z9884 Bariatric surgery status: Secondary | ICD-10-CM | POA: Diagnosis not present

## 2019-10-19 HISTORY — PX: CARDIOVERSION: SHX1299

## 2019-10-19 LAB — POCT I-STAT, CHEM 8
BUN: 47 mg/dL — ABNORMAL HIGH (ref 8–23)
Calcium, Ion: 1.23 mmol/L (ref 1.15–1.40)
Chloride: 99 mmol/L (ref 98–111)
Creatinine, Ser: 3.8 mg/dL — ABNORMAL HIGH (ref 0.61–1.24)
Glucose, Bld: 137 mg/dL — ABNORMAL HIGH (ref 70–99)
HCT: 43 % (ref 39.0–52.0)
Hemoglobin: 14.6 g/dL (ref 13.0–17.0)
Potassium: 3.4 mmol/L — ABNORMAL LOW (ref 3.5–5.1)
Sodium: 136 mmol/L (ref 135–145)
TCO2: 27 mmol/L (ref 22–32)

## 2019-10-19 SURGERY — CARDIOVERSION
Anesthesia: General

## 2019-10-19 MED ORDER — PROPOFOL 10 MG/ML IV BOLUS
INTRAVENOUS | Status: DC | PRN
  Administered 2019-10-19: 60 mg via INTRAVENOUS

## 2019-10-19 MED ORDER — LIDOCAINE 2% (20 MG/ML) 5 ML SYRINGE
INTRAMUSCULAR | Status: DC | PRN
  Administered 2019-10-19: 60 mg via INTRAVENOUS

## 2019-10-19 MED ORDER — AMIODARONE HCL 200 MG PO TABS: 200.0000 mg | ORAL_TABLET | Freq: Every day | ORAL | 0 refills | Status: DC

## 2019-10-19 MED ORDER — SODIUM CHLORIDE 0.9 % IV SOLN: INTRAVENOUS | Status: DC

## 2019-10-19 NOTE — Anesthesia Preprocedure Evaluation (Addendum)
Anesthesia Evaluation  Patient identified by MRN, date of birth, ID band Patient awake    Reviewed: Allergy & Precautions, NPO status , Patient's Chart, lab work & pertinent test results  Airway Mallampati: II  TM Distance: >3 FB Neck ROM: Full    Dental no notable dental hx. (+) Teeth Intact   Pulmonary sleep apnea ,    Pulmonary exam normal breath sounds clear to auscultation       Cardiovascular hypertension, + CAD and + Past MI  Normal cardiovascular exam+ dysrhythmias Atrial Fibrillation  Rhythm:Irregular Rate:Abnormal  08/07/19 Ech EF 50-55% CAD S/P CABG   Neuro/Psych negative neurological ROS  negative psych ROS   GI/Hepatic GERD  ,  Endo/Other  diabetes  Renal/GU CRFRenal disease     Musculoskeletal negative musculoskeletal ROS (+)   Abdominal   Peds  Hematology   Anesthesia Other Findings   Reproductive/Obstetrics                            Anesthesia Physical Anesthesia Plan  ASA: III  Anesthesia Plan: General   Post-op Pain Management:    Induction:   PONV Risk Score and Plan: Treatment may vary due to age or medical condition  Airway Management Planned: Nasal Cannula and Natural Airway  Additional Equipment: None  Intra-op Plan:   Post-operative Plan:   Informed Consent: I have reviewed the patients History and Physical, chart, labs and discussed the procedure including the risks, benefits and alternatives for the proposed anesthesia with the patient or authorized representative who has indicated his/her understanding and acceptance.     Dental advisory given  Plan Discussed with:   Anesthesia Plan Comments:        Anesthesia Quick Evaluation

## 2019-10-19 NOTE — Procedures (Signed)
Electrical Cardioversion Procedure Note [name] [id number] 1946-08-19  Procedure: Electrical Cardioversion Indications:  Atrial Fibrillation  Procedure Details Consent: Risks of procedure as well as the alternatives and risks of each were explained to the (patient/caregiver).  Consent for procedure obtained. Time Out: Verified patient identification, verified procedure, site/side was marked, verified correct patient position, special equipment/implants available, medications/allergies/relevent history reviewed, required imaging and test results available.  Performed  Patient placed on cardiac monitor, pulse oximetry, supplemental oxygen as necessary.  Sedation given: Propofol per anesthesiology Pacer pads placed anterior and posterior chest.  Cardioverted 1 time(s).  Cardioverted at [place].  Evaluation Findings: Post procedure EKG shows: NSR Complications: None Patient did tolerate procedure well.   [name] 10/19/2019, 12:09 PM

## 2019-10-19 NOTE — Transfer of Care (Signed)
Immediate Anesthesia Transfer of Care Note  Patient: [name]  Procedure(s) Performed: CARDIOVERSION (N/A )  Patient Location: Endoscopy Unit  Anesthesia Type:General  Level of Consciousness: unresponsive and patient cooperative  Airway & Oxygen Therapy: Patient Spontanous Breathing  Post-op Assessment: Report given to RN, Post -op Vital signs reviewed and stable and Patient moving all extremities X 4  Post vital signs: Reviewed and stable  Last Vitals:  Vitals Value Taken Time  BP    Temp    Pulse    Resp    SpO2      Last Pain:  Vitals:   10/19/19 1117  TempSrc: Temporal  PainSc: 0-No pain         Complications: No apparent anesthesia complications

## 2019-10-19 NOTE — Interval H&P Note (Signed)
History and Physical Interval Note:  10/19/2019 12:02 PM  [name]  has presented today for surgery, with the diagnosis of AFIB.  The various methods of treatment have been discussed with the patient and family. After consideration of risks, benefits and other options for treatment, the patient has consented to  Procedure(s): CARDIOVERSION (N/A) as a surgical intervention.  The patient's history has been reviewed, patient examined, no change in status, stable for surgery.  I have reviewed the patient's chart and labs.  Questions were answered to the patient's satisfaction.     [name]

## 2019-10-19 NOTE — Discharge Instructions (Signed)
Electrical Cardioversion Electrical cardioversion is the delivery of a jolt of electricity to restore a normal rhythm to the heart. A rhythm that is too fast or is not regular keeps the heart from pumping well. In this procedure, sticky patches or metal paddles are placed on the chest to deliver electricity to the heart from a device. This procedure may be done in an emergency if:  There is low or no blood pressure as a result of the heart rhythm.  Normal rhythm must be restored as fast as possible to protect the brain and heart from further damage.  It may save a life. This may also be a scheduled procedure for irregular or fast heart rhythms that are not immediately life-threatening. Tell a health care provider about:  Any allergies you have.  All medicines you are taking, including vitamins, herbs, eye drops, creams, and over-the-counter medicines.  Any problems you or family members have had with anesthetic medicines.  Any blood disorders you have.  Any surgeries you have had.  Any medical conditions you have.  Whether you are pregnant or may be pregnant. What are the risks? Generally, this is a safe procedure. However, problems may occur, including:  Allergic reactions to medicines.  A blood clot that breaks free and travels to other parts of your body.  The possible return of an abnormal heart rhythm within hours or days after the procedure.  Your heart stopping (cardiac arrest). This is rare. What happens before the procedure? Medicines  Your health care provider may have you start taking: ? Blood-thinning medicines (anticoagulants) so your blood does not clot as easily. ? Medicines to help stabilize your heart rate and rhythm.  Ask your health care provider about: ? Changing or stopping your regular medicines. This is especially important if you are taking diabetes medicines or blood thinners. ? Taking medicines such as aspirin and ibuprofen. These medicines can  thin your blood. Do not take these medicines unless your health care provider tells you to take them. ? Taking over-the-counter medicines, vitamins, herbs, and supplements. General instructions  Follow instructions from your health care provider about eating or drinking restrictions.  Plan to have someone take you home from the hospital or clinic.  If you will be going home right after the procedure, plan to have someone with you for 24 hours.  Ask your health care provider what steps will be taken to help prevent infection. These may include washing your skin with a germ-killing soap. What happens during the procedure?   An IV will be inserted into one of your veins.  Sticky patches (electrodes) or metal paddles may be placed on your chest.  You will be given a medicine to help you relax (sedative).  An electrical shock will be delivered. The procedure may vary among health care providers and hospitals. What can I expect after the procedure?  Your blood pressure, heart rate, breathing rate, and blood oxygen level will be monitored until you leave the hospital or clinic.  Your heart rhythm will be watched to make sure it does not change.  You may have some redness on the skin where the shocks were given. Follow these instructions at home:  Do not drive for 24 hours if you were given a sedative during your procedure.  Take over-the-counter and prescription medicines only as told by your health care provider.  Ask your health care provider how to check your pulse. Check it often.  Rest for 48 hours after the procedure or   as told by your health care provider.  Avoid or limit your caffeine use as told by your health care provider.  Keep all follow-up visits as told by your health care provider. This is important. Contact a health care provider if:  You feel like your heart is beating too quickly or your pulse is not regular.  You have a serious muscle cramp that does not go  away. Get help right away if:  You have discomfort in your chest.  You are dizzy or you feel faint.  You have trouble breathing or you are short of breath.  Your speech is slurred.  You have trouble moving an arm or leg on one side of your body.  Your fingers or toes turn cold or blue. Summary  Electrical cardioversion is the delivery of a jolt of electricity to restore a normal rhythm to the heart.  This procedure may be done right away in an emergency or may be a scheduled procedure if the condition is not an emergency.  Generally, this is a safe procedure.  After the procedure, check your pulse often as told by your health care provider. This information is not intended to replace advice given to you by your health care provider. Make sure you discuss any questions you have with your health care provider. Document Revised: 03/13/2019 Document Reviewed: 03/13/2019 Elsevier Patient Education  2020 [employer] Inc[employer]  

## 2019-10-19 NOTE — Anesthesia Postprocedure Evaluation (Signed)
Anesthesia Post Note  Patient: [name]  Procedure(s) Performed: CARDIOVERSION (N/A )     Patient location during evaluation: Endoscopy Anesthesia Type: General Level of consciousness: awake and alert Pain management: pain level controlled Vital Signs Assessment: post-procedure vital signs reviewed and stable Respiratory status: spontaneous breathing, nonlabored ventilation, respiratory function stable and patient connected to nasal cannula oxygen Cardiovascular status: blood pressure returned to baseline and stable Postop Assessment: no apparent nausea or vomiting Anesthetic complications: no    Last Vitals:  Vitals:   10/19/19 1213 10/19/19 1221  BP: (!) 90/55 101/63  Pulse:  64  Resp: (!) 21 (!) 22  Temp:    SpO2: 99% 96%    Last Pain:  Vitals:   10/19/19 1221  TempSrc:   PainSc: 0-No pain                 Barnet Glasgow

## 2019-10-30 ENCOUNTER — Telehealth (HOSPITAL_COMMUNITY): Payer: Self-pay

## 2019-10-30 NOTE — Telephone Encounter (Signed)

## 2019-10-31 ENCOUNTER — Ambulatory Visit (HOSPITAL_COMMUNITY)
Admission: RE | Admit: 2019-10-31 | Discharge: 2019-10-31 | Disposition: A | Discharge status: Home / Self Care | Payer: Medicare Other | Source: Ambulatory Visit | Attending: Cardiology | Admitting: Cardiology

## 2019-10-31 ENCOUNTER — Encounter (HOSPITAL_COMMUNITY): Payer: Self-pay | Admitting: Cardiology

## 2019-10-31 ENCOUNTER — Other Ambulatory Visit: Payer: Self-pay

## 2019-10-31 VITALS — BP 100/70 | HR 67 | Wt 209.6 lb

## 2019-10-31 DIAGNOSIS — I252 Old myocardial infarction: Secondary | ICD-10-CM | POA: Insufficient documentation

## 2019-10-31 DIAGNOSIS — E1122 Type 2 diabetes mellitus with diabetic chronic kidney disease: Secondary | ICD-10-CM | POA: Insufficient documentation

## 2019-10-31 DIAGNOSIS — Z7901 Long term (current) use of anticoagulants: Secondary | ICD-10-CM | POA: Insufficient documentation

## 2019-10-31 DIAGNOSIS — I130 Hypertensive heart and chronic kidney disease with heart failure and stage 1 through stage 4 chronic kidney disease, or unspecified chronic kidney disease: Secondary | ICD-10-CM | POA: Insufficient documentation

## 2019-10-31 DIAGNOSIS — I5032 Chronic diastolic (congestive) heart failure: Secondary | ICD-10-CM | POA: Insufficient documentation

## 2019-10-31 DIAGNOSIS — Z9889 Other specified postprocedural states: Secondary | ICD-10-CM | POA: Diagnosis not present

## 2019-10-31 DIAGNOSIS — Z951 Presence of aortocoronary bypass graft: Secondary | ICD-10-CM | POA: Insufficient documentation

## 2019-10-31 DIAGNOSIS — E785 Hyperlipidemia, unspecified: Secondary | ICD-10-CM | POA: Insufficient documentation

## 2019-10-31 DIAGNOSIS — M109 Gout, unspecified: Secondary | ICD-10-CM | POA: Insufficient documentation

## 2019-10-31 DIAGNOSIS — Z833 Family history of diabetes mellitus: Secondary | ICD-10-CM | POA: Diagnosis not present

## 2019-10-31 DIAGNOSIS — I447 Left bundle-branch block, unspecified: Secondary | ICD-10-CM | POA: Insufficient documentation

## 2019-10-31 DIAGNOSIS — Z8249 Family history of ischemic heart disease and other diseases of the circulatory system: Secondary | ICD-10-CM | POA: Insufficient documentation

## 2019-10-31 DIAGNOSIS — Z6831 Body mass index (BMI) 31.0-31.9, adult: Secondary | ICD-10-CM | POA: Insufficient documentation

## 2019-10-31 DIAGNOSIS — E669 Obesity, unspecified: Secondary | ICD-10-CM | POA: Insufficient documentation

## 2019-10-31 DIAGNOSIS — I6523 Occlusion and stenosis of bilateral carotid arteries: Secondary | ICD-10-CM | POA: Diagnosis not present

## 2019-10-31 DIAGNOSIS — I340 Nonrheumatic mitral (valve) insufficiency: Secondary | ICD-10-CM | POA: Diagnosis not present

## 2019-10-31 DIAGNOSIS — Z9884 Bariatric surgery status: Secondary | ICD-10-CM | POA: Diagnosis not present

## 2019-10-31 DIAGNOSIS — I480 Paroxysmal atrial fibrillation: Secondary | ICD-10-CM | POA: Diagnosis not present

## 2019-10-31 DIAGNOSIS — R079 Chest pain, unspecified: Secondary | ICD-10-CM | POA: Diagnosis not present

## 2019-10-31 DIAGNOSIS — N179 Acute kidney failure, unspecified: Secondary | ICD-10-CM | POA: Diagnosis not present

## 2019-10-31 DIAGNOSIS — Z823 Family history of stroke: Secondary | ICD-10-CM | POA: Insufficient documentation

## 2019-10-31 DIAGNOSIS — I2510 Atherosclerotic heart disease of native coronary artery without angina pectoris: Secondary | ICD-10-CM | POA: Diagnosis not present

## 2019-10-31 DIAGNOSIS — Z7984 Long term (current) use of oral hypoglycemic drugs: Secondary | ICD-10-CM | POA: Diagnosis not present

## 2019-10-31 DIAGNOSIS — I208 Other forms of angina pectoris: Secondary | ICD-10-CM

## 2019-10-31 DIAGNOSIS — G4733 Obstructive sleep apnea (adult) (pediatric): Secondary | ICD-10-CM | POA: Insufficient documentation

## 2019-10-31 DIAGNOSIS — N184 Chronic kidney disease, stage 4 (severe): Secondary | ICD-10-CM | POA: Insufficient documentation

## 2019-10-31 DIAGNOSIS — Z79899 Other long term (current) drug therapy: Secondary | ICD-10-CM | POA: Insufficient documentation

## 2019-10-31 DIAGNOSIS — Z8673 Personal history of transient ischemic attack (TIA), and cerebral infarction without residual deficits: Secondary | ICD-10-CM | POA: Insufficient documentation

## 2019-10-31 LAB — COMPREHENSIVE METABOLIC PANEL
ALT: 15 U/L (ref 0–44)
AST: 16 U/L (ref 15–41)
Albumin: 3.8 g/dL (ref 3.5–5.0)
Alkaline Phosphatase: 48 U/L (ref 38–126)
Anion gap: 11 (ref 5–15)
BUN: 26 mg/dL — ABNORMAL HIGH (ref 8–23)
CO2: 24 mmol/L (ref 22–32)
Calcium: 9.2 mg/dL (ref 8.9–10.3)
Chloride: 102 mmol/L (ref 98–111)
Creatinine, Ser: 3.32 mg/dL — ABNORMAL HIGH (ref 0.61–1.24)
GFR calc Af Amer: 20 mL/min — ABNORMAL LOW (ref >60)
GFR calc non Af Amer: 17 mL/min — ABNORMAL LOW (ref >60)
Glucose, Bld: 135 mg/dL — ABNORMAL HIGH (ref 70–99)
Potassium: 4.1 mmol/L (ref 3.5–5.1)
Sodium: 137 mmol/L (ref 135–145)
Total Bilirubin: 0.8 mg/dL (ref 0.3–1.2)
Total Protein: 6.3 g/dL — ABNORMAL LOW (ref 6.5–8.1)

## 2019-10-31 LAB — TSH: TSH: 2.42 u[IU]/mL (ref 0.350–4.500)

## 2019-10-31 MED ORDER — NITROGLYCERIN 0.4 MG SL SUBL: SUBLINGUAL_TABLET | SUBLINGUAL | 3 refills | Status: DC

## 2019-10-31 MED ORDER — CARVEDILOL 3.125 MG PO TABS: 3.1250 mg | ORAL_TABLET | Freq: Every day | ORAL | 5 refills | Status: DC

## 2019-10-31 NOTE — Patient Instructions (Signed)
DECREASE Coreg to 3.125mg  (1 tab) twice a day   Labs today We will only contact you if something comes back abnormal or we need to make some changes. Otherwise no news is good news!   Your physician recommends that you schedule a follow-up appointment in: 4 months with Dr Aundra Dubin. We will call you to schedule this appointment.   Please call office at [phone] option [phone] if you have any questions or concerns.   At the [facility], you and your health needs are our priority. As part of our continuing mission to provide you with exceptional heart care, we have created designated Provider Care Teams. These Care Teams include your primary Cardiologist (physician) and Advanced Practice Providers (APPs- Physician Assistants and Nurse Practitioners) who all work together to provide you with the care you need, when you need it.   You may see any of the following providers on your designated Care Team at your next follow up: Marland Kitchen Dr Glori Bickers . Dr [name] . Darrick Grinder, NP . Lyda Jester, PA . Audry Riles, PharmD   Please be sure to bring in all your medications bottles to every appointment.

## 2019-11-01 NOTE — Progress Notes (Signed)
Patient ID: [name], male   DOB: 17-Apr-1946, 74 y.o.   MRN: [id number] PCP: Dr Harrington Challenger Cardiology: Dr. Aundra Dubin  74 y.o. with history of CAD s/p CABG and obesity s/p lap banding procedure presents for cardiology followup.  Given exertional dyspnea and chest tightness, he had LHC in 1/15.  This showed no change from prior.  Grafts were patent and there was severe stenosis in distal D1, not amenable to PCI.   In 2017, he developed increased exertional dyspnea.  Eventually had a TEE showing severe MR with prolapse of a portion of the anterior mitral valve leaflet.  Coronary angiography in 6/17 showed patent grafts and 90% stenosis of distal diagonal not amenable to intervention.  In 8/17, he had mitral valve repair.  Post-op diastolic CHF, Lasix started and increased.   He had a cath again in 9/18 showing stable coronary anatomy.    He developed atrial arrhythmias in 12/18.  Both fibrillation and flutter were seen.  Saw Dr. Rayann Heman, recommended DCCV with amiodarone or sotalol if fibrillation recurs.  He had DCCV in 1/19.   In 6/19, he was noted to have developed AKI. Creatinine went up to 3.46.  Renal US was done, showing normal kidneys. Cause of AKI was not clear.  We stopped his Lasix. He saw nephrology and eventually went back on Lasix, currently taking 80 mg daily.  He is not using any NSAIDs.    He stopped pravastatin due to myalgias. We struggled to get him on a PCSK9 inhibitor through the [place] but he was finally able to get Praluent.    I had him do a Lexiscan Cardiolite in 11/19, which showed old inferior MI with no ischemia.  Echo in 11/19 showed EF 55-60%, s/p MV repair with mean gradient 6 mmHg.   Echo 12/20 showed EF 50-55%, mildly decreased RV systolic function, repaired mitral valve with mild MR, mean gradient 6 mmHg across MV.    He had DCCV from atrial fibrillation to NSR in 1/21 and again in 2/21.   He returns today for followup of CAD, CHF, and atrial fibrillation.  He is in NSR  today.  He is less fatigued and feels better in NSR. Still tires more easily than in the past.  No chest pain and no dyspnea.  Using CPAP.  Weight stable.  No lightheadedness.    ECG (personally reviewed): NSR, 1st degree AVB 216 msec, IVCD 118 msec, poor RWP  Labs (11/12): LDL 70 Labs (5/13): K 4.1, creatinine 1.1, BNP 48 Labs (12/13): K 3.4, creatinine 1.07, LDL 59, HDL 37 Labs (1/15): K 4.3, creatinine 1.2, HCT 40.3 Labs (3/15): LDL 38, HDL 81, BNP 55, K 4, creatinine 1.2 Labs (6/15): K 4.2, creatinine 1.4, LFTs normal, LDL 66, HDL 35 Labs (8/16): K 4, creatinine 1.6, BNP 64 Labs (12/16): LDL 61, HDL 37 Labs (4/17): K 4.2, creatinine 1.35, HCT 38.7, BNP 80 Labs (8/17): K 4.2, creatinine 1.4 => 1.66, BNP 159 Labs (12/17): K 3.6, creatinine 1.75 Labs (2/18): LDL 76, HDL 36 Labs (9/18): K 3.9, creatinine 1.76 Labs (1/19): K 4.9, creatinine 1.76, hgb 14 Labs (6/19): K 3.6, creatinine 3.46 Labs (7/19): K 4.1, creatinine 3.66, hgb 12.7 Labs (10/19): LDL 177, hgb 13.4, troponin negative, K 4, creatinine 4.15 Labs (12/19): K 4.3, creatinine 3.88 Labs (4/20): K 3.6, creatinine 3.39 Labs (11/20): LDL 73, HDL 38 Labs (2/21): K 3.4, creatinine 3.8  PMH: 1. OSA: on CPAP.  2. Diabetes mellitus 3. Allergic rhinitis 4. CAD: s/p CABG  1995.  LHC (10/11) with 90% distal D1 (patent proximal D1 stent), total occlusion CFX, total occlusion RCA, no significant stenoses in LAD, SVG-PDA patent, SVG-OM patent, EF 55%.  Medical management.  LHC (5/13) with patent grafts and 90% distal D1 stenosis (no significant change from 10/11).  LHC (1/15) with patent grafts, 75% ISR in D1 stent, 90% stenosis distal D1 (small caliber). D1 not amenable to intervention.  - Coronary angiography (6/17): grafts patent, 90% stenosis distally in large diagonal not amenable to intervention.   - LHC (9/18): SVG-OM and SVG-PDA patent,  75% in-stent restenosis in proximal diagonal then 90% stenosis distally (small in caliber at  that point), totally occluded LCx and RCA.  Left coronary system similar to prior caths, D1 may be source of angina.  - Lexiscan Cardiolite (11/19): EF 47%, old inferior MI, no ischemia.  5. Obesity: lap-banding in 5/11.  6. Chronic diastolic CHF: Echo (0/09) with EF 55-60%, moderate LVH, moderate diastolic dysfunction, s/p mitral valve repair with elevated mean gradient but normal pressure half-time.  - Echo (8/18): EF 55-60%, s/p MV repair with mean gradient 6 mmHg, PASP 28 mmHg, mildly decreased RV systolic function.  - Echo (11/19): EF 55-60%, s/p MV repair with mean gradient 6, PASP 42 mmHg, mildly dilated RV, dilated IVC.  - Echo (12/20): EF 50-55%, mildly decreased RV systolic function. S/p MV repair with mean gradient 6 mmHg, mild MR.  7. TIA: Carotid dopplers in 11/11 with 0-39% bilateral stenosis.  Carotid dopplers 6/17 with 1-39% RICA stenosis.  8. OA: Knees, C-spine. TKR 9/13.  9. HTN 10. Hyperlipidemia: Myalgias with atorvastatin and Crestor. Now on Praluent.  11. Diverticulosis 12. MItral regurgitation: TEE (6/17) with EF 55-60%, severe eccentric MR, prolapse of a portion of the anterior leaflet, peak RV-RA gradient 45 mmHg.  - Mitral valve repair 8/17 13. CKD: Stage III.  14. Atrial fibrillation/atrial flutter: Both arrhythmias noted in 12/18.  DCCV 12/19.   - DCCV to NSR 1/21.  - DCCV to NSR 2/21.  15. AKI 6/19 => CKD stage 4: uncertain etiology.  Renal US was normal.  16. 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  Lives in [place].   Family History  Problem Relation Age of Onset  . Other Mother        joint problems  . Hypertension Father   . Heart disease Father   . CVA Father   . Depression Father   . Heart disease Sister        CABG  . Stroke Sister   . Hypertension Sister   . Congestive Heart Failure Sister   . Diabetes Sister   . Heart disease Brother   . Hypertension Brother   . Congestive Heart Failure Brother     ROS: All  systems reviewed and negative except as per HPI.    Current Outpatient Medications  Medication Sig Dispense Refill  . Alirocumab (PRALUENT) 75 MG/ML SOAJ Inject 75 mg into the skin every 14 (fourteen) days.    Marland Kitchen allopurinol (ZYLOPRIM) 100 MG tablet Take 100 mg by mouth in the morning and at bedtime.     Marland Kitchen amiodarone (PACERONE) 200 MG tablet Take 1 tablet (200 mg total) by mouth daily. 60 tablet 0  . amoxicillin (AMOXIL) 500 MG tablet Take 4 tablets (2 g) by mouth 30-60 minutes prior to dental appointment (Patient taking differently: Take 2,000 mg by mouth See admin instructions. Take 4 tablets (2 g) by mouth 30-60 minutes prior to  dental appointment) 12 tablet 0  . apixaban (ELIQUIS) 5 MG TABS tablet Take 1 tablet (5 mg total) by mouth 2 (two) times daily. 180 tablet 3  . Artificial Saliva (MOUTH KOTE MT) Use as directed 4 sprays in the mouth or throat every 3 (three) hours as needed (for dry mouth).    . calcitRIOL (ROCALTROL) 0.25 MCG capsule Take 0.25 mcg by mouth at bedtime.     . carvedilol (COREG) 3.125 MG tablet Take 1 tablet (3.125 mg total) by mouth daily. 60 tablet 5  . cetirizine (ZYRTEC) 10 MG tablet Take 10 mg by mouth daily.     . colchicine 0.6 MG tablet Take 0.6-1.2 mg by mouth 2 (two) times daily as needed (gout).     . furosemide (LASIX) 40 MG tablet Take 80 mg by mouth daily.     Marland Kitchen glipiZIDE (GLUCOTROL) 5 MG tablet Take 1 tablet (5 mg total) by mouth daily before supper. (Patient taking differently: Take 5 mg by mouth daily. ) 90 tablet 3  . HYDROcodone-acetaminophen (NORCO/VICODIN) 5-325 MG tablet Take 0.5 tablets by mouth as needed for moderate pain.     . Hypromellose (ARTIFICIAL TEARS OP) Apply 1 drop to eye 4 (four) times daily as needed (dry eyes).     . nitroGLYCERIN (NITROSTAT) 0.4 MG SL tablet PLACE 1 TABLET UNDER THE TONGUE EVERY 5 MINUTES AS NEEDED FOR CHEST PAIN UP TO 3 DOSES, IF SYMPTOMS PERSIST CALL 911 25 tablet 3  . pantoprazole (PROTONIX) 40 MG tablet Take 40  mg by mouth daily as needed (acid reflux/indigestion.).     Marland Kitchen potassium chloride SA (K-DUR,KLOR-CON) 20 MEQ tablet Take 20 mEq by mouth daily.    . Semaglutide,0.25 or 0.5MG /DOS, (OZEMPIC, 0.25 OR 0.5 MG/DOSE,) 2 MG/1.5ML SOPN Inject 0.5 mg into the skin once a week. (Patient taking differently: Inject 0.5 mg into the skin every Thursday. ) 3 pen 3  . sildenafil (VIAGRA) 50 MG tablet Take 1 tablet (50 mg total) by mouth daily as needed for erectile dysfunction. Do not take nitroglycerin with this medication 10 tablet 3  . tadalafil (CIALIS) 20 MG tablet Take 20 mg by mouth daily as needed (erectile dysfunction.).     Marland Kitchen testosterone cypionate (DEPOTESTOSTERONE CYPIONATE) 200 MG/ML injection Inject 400 mg into the muscle once a week.     . traZODone (DESYREL) 100 MG tablet Take 100 mg by mouth at bedtime.     No current facility-administered medications for this encounter.    BP 100/70   Pulse 67   Wt 95.1 kg (209 lb 9.6 oz)   SpO2 98%   BMI 31.87 kg/m  General: NAD Neck: No JVD, no thyromegaly or thyroid nodule.  Lungs: Clear to auscultation bilaterally with normal respiratory effort. CV: Nondisplaced PMI.  Heart regular S1/S2, no S3/S4, no murmur.  No peripheral edema.  No carotid bruit.  Normal pedal pulses.  Abdomen: Soft, nontender, no hepatosplenomegaly, no distention.  Skin: Intact without lesions or rashes.  Neurologic: Alert and oriented x 3.  Psych: Normal affect. Extremities: No clubbing or cyanosis.  HEENT: Normal.   Assessment/Plan: 1. S/p mitral valve repair:  Echo 12/20 showed stable mitral valve repair compared to prior echo with mild MR, mean gradient 6 mmHg.   - With prosthetic material, should use antibiotic prophylaxis with dental work.  2. CAD:  He has severe stenosis in a distal D1 that is not amenable to intervention.  This has been stable on last couple of caths. He has occluded  RCA and LCx known from the past with SVGs to OM and PDA.  EF remains preserved by  echo. Cardiolite in 11/19 showed no ischemia. No further chest pain.  - No ASA with apixaban use.  - With low BP and fatigue, I will have him decrease Coreg to 3.125 mg bid.  - He cannot tolerate statins with myalgias, Now on Praluent.  Good lipids in 11/20.   3. Hyperlipidemia: Myalgias with Crestor, pravastatin, and atorvastatin.  We were finally able to get him Praluent through the [place].  - Lipids in 11/20 ok.  4. Chronic diastolic CHF:  NYHA class II symptoms (no dyspnea but prominent fatigue).  He does not look volume overloaded.  - Continue Lasix 40 mg daily.   - BMET today.  - Decrease Coreg to 3.125 mg bid given prominent fatigue and soft BP.  5. OSA: Continue CPAP.  6. Atrial fibrillation: Paroxysmal, in NSR after DCCV in 2/21.  He is now on amiodarone to maintain NSR given rapid recurrence of AF after DCCV in 1/21.  Probably not a good ablation candidate with size of atria.   - Continue apixaban 5 mg bid.  - Continue amiodarone 200 mg daily.  Check LFTs and TSH today.  He will need a regular eye exam.  7. CKD stage 4: AKI without recovery, uncertain etiology.  He sees nephrology, there is concern that he is progressing towards HD. He now has AV fistula.  - Check BMET today.  8. Diabetes: He is now followed by endocrinology.    Followup 4 mos  [name] 11/01/2019

## 2019-11-28 ENCOUNTER — Other Ambulatory Visit: Payer: Self-pay | Admitting: Urology

## 2019-12-21 DIAGNOSIS — R5383 Other fatigue: Secondary | ICD-10-CM | POA: Diagnosis not present

## 2019-12-21 DIAGNOSIS — R42 Dizziness and giddiness: Secondary | ICD-10-CM | POA: Diagnosis not present

## 2019-12-21 DIAGNOSIS — I4891 Unspecified atrial fibrillation: Secondary | ICD-10-CM | POA: Diagnosis not present

## 2020-01-04 ENCOUNTER — Other Ambulatory Visit: Payer: Self-pay | Admitting: Urology

## 2020-01-10 DIAGNOSIS — D631 Anemia in chronic kidney disease: Secondary | ICD-10-CM | POA: Diagnosis not present

## 2020-01-10 DIAGNOSIS — R319 Hematuria, unspecified: Secondary | ICD-10-CM | POA: Diagnosis not present

## 2020-01-10 DIAGNOSIS — M109 Gout, unspecified: Secondary | ICD-10-CM | POA: Diagnosis not present

## 2020-01-10 DIAGNOSIS — N2581 Secondary hyperparathyroidism of renal origin: Secondary | ICD-10-CM | POA: Diagnosis not present

## 2020-01-10 DIAGNOSIS — N185 Chronic kidney disease, stage 5: Secondary | ICD-10-CM | POA: Diagnosis not present

## 2020-01-10 DIAGNOSIS — I2510 Atherosclerotic heart disease of native coronary artery without angina pectoris: Secondary | ICD-10-CM | POA: Diagnosis not present

## 2020-01-10 DIAGNOSIS — E785 Hyperlipidemia, unspecified: Secondary | ICD-10-CM | POA: Diagnosis not present

## 2020-01-10 DIAGNOSIS — N189 Chronic kidney disease, unspecified: Secondary | ICD-10-CM | POA: Diagnosis not present

## 2020-01-10 DIAGNOSIS — E1122 Type 2 diabetes mellitus with diabetic chronic kidney disease: Secondary | ICD-10-CM | POA: Diagnosis not present

## 2020-01-10 DIAGNOSIS — I770 Arteriovenous fistula, acquired: Secondary | ICD-10-CM | POA: Diagnosis not present

## 2020-01-10 DIAGNOSIS — I4891 Unspecified atrial fibrillation: Secondary | ICD-10-CM | POA: Diagnosis not present

## 2020-01-10 DIAGNOSIS — Z79899 Other long term (current) drug therapy: Secondary | ICD-10-CM | POA: Diagnosis not present

## 2020-01-10 DIAGNOSIS — Z9889 Other specified postprocedural states: Secondary | ICD-10-CM | POA: Diagnosis not present

## 2020-01-10 DIAGNOSIS — I120 Hypertensive chronic kidney disease with stage 5 chronic kidney disease or end stage renal disease: Secondary | ICD-10-CM | POA: Diagnosis not present

## 2020-01-29 ENCOUNTER — Ambulatory Visit (INDEPENDENT_AMBULATORY_CARE_PROVIDER_SITE_OTHER): Payer: Medicare Other | Admitting: Internal Medicine

## 2020-01-29 ENCOUNTER — Encounter: Payer: Self-pay | Admitting: Internal Medicine

## 2020-01-29 ENCOUNTER — Other Ambulatory Visit: Payer: Self-pay

## 2020-01-29 VITALS — BP 130/60 | HR 60 | Ht 68.0 in | Wt 203.0 lb

## 2020-01-29 DIAGNOSIS — E1159 Type 2 diabetes mellitus with other circulatory complications: Secondary | ICD-10-CM

## 2020-01-29 DIAGNOSIS — I208 Other forms of angina pectoris: Secondary | ICD-10-CM | POA: Diagnosis not present

## 2020-01-29 DIAGNOSIS — E1165 Type 2 diabetes mellitus with hyperglycemia: Secondary | ICD-10-CM

## 2020-01-29 DIAGNOSIS — E782 Mixed hyperlipidemia: Secondary | ICD-10-CM

## 2020-01-29 DIAGNOSIS — E669 Obesity, unspecified: Secondary | ICD-10-CM | POA: Diagnosis not present

## 2020-01-29 LAB — POCT GLYCOSYLATED HEMOGLOBIN (HGB A1C): Hemoglobin A1C: 5.9 % — AB (ref 4.0–5.6)

## 2020-01-29 MED ORDER — OZEMPIC (0.25 OR 0.5 MG/DOSE) 2 MG/1.5ML ~~LOC~~ SOPN: 0.5000 mg | PEN_INJECTOR | SUBCUTANEOUS | 3 refills | Status: DC

## 2020-01-29 NOTE — Addendum Note (Signed)
Addended by: Cardell Peach I on: 01/29/2020 11:46 AM   Modules accepted: Orders

## 2020-01-29 NOTE — Patient Instructions (Signed)
Please continue: - Ozempic 0.5 mg weekly   Stop Glipizide.  Please return in 4 months with your sugar log.

## 2020-01-29 NOTE — Progress Notes (Signed)
Patient ID: [name], male   DOB: 07-20-1946, 74 y.o.   MRN: [id number]   This visit occurred during the SARS-CoV-2 public health emergency.  Safety protocols were in place, including screening questions prior to the visit, additional usage of staff PPE, and extensive cleaning of exam room while observing appropriate contact time as indicated for disinfecting solutions.   HPI: [name] is a 74 y.o.-year-old male, initially referred by his nephrologist, Dr. 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 fib, CKD stage V, cerebrovascular disease, s/p TIA, ED).  Last visit 4 months ago.  Reviewed HbA1c levels: Lab Results  Component Value Date   HGBA1C 7.1 (A) 09/25/2019   HGBA1C 7.1 (A) 05/23/2019   HGBA1C 8.5 (A) 02/02/2019   HGBA1C 7.2 (H) 03/23/2016   HGBA1C 6.5 (H) 08/20/2012  01/11/2019: HbA1c 8.3% 10/04/2018: HbA1c 8.2%  He is currently on: - Ozempic 0.5 mg weekly - Glipizide 5 mg before a larger dinner >> before b'fast He came off metformin due to worsening kidney function. Stopped Glipizide 5 mg before b'fast and dinner when we started Ozempic, but restarted it 04/2019.  Pt checks his sugars 1x a day: - am:169-299 >> 161-185, 193 >> 85, 120-157, 170, 182 >> 110-130 - 2h after b'fast: n/c - before lunch: n/c - 2h after lunch: n/c >> 120-130 - before dinner: 129 >> n/c - 2h after dinner: n/c >> 190 >> n/c - bedtime: n/c - nighttime: n/c Lowest sugar was 169 >> 161 >> 85 >> 104; he has hypoglycemia awareness in the 70s. Highest sugar was 299 >> 193 >> 190 >> 160.  Pt's meals are: - Breakfast: egg and bacon (not lately).   - Brunch: meat + veggies - Dinner: same as lunch - Snacks: crackers, popcorn   -+ Stage V CKD-sees nephrology.  He had a fistula placed but no need for dialysis yet; last BUN/creatinine:  Lab Results  Component Value Date    BUN 26 (H) 10/31/2019   BUN 47 (H) 10/19/2019   CREATININE 3.32 (H) 10/31/2019   CREATININE 3.80 (H) 10/19/2019  01/11/2019: 45//3.79, GFR 16, glucose 149, ACR 52.5 10/04/2018: 32/3.78, GFR 15, glucose 184 He is not on ACE inhibitor/ARB.  -+ HL; last set of lipids: Lab Results  Component Value Date   CHOL 149 07/11/2019   HDL 38 (L) 07/11/2019   LDLCALC 73 07/11/2019   TRIG 188 (H) 07/11/2019   CHOLHDL 3.9 07/11/2019  He developed generalized aches and pains from pravastatin.  She is also off Zetia.  Currently on Praluent.  - last eye exam was in 11/2019: Reported[facility] no DR ([facility]).   -No numbness and tingling in his feet.  Pt has FH of DM in sister and brother.  He has a history of lap band surgery 2010. Lost from 373 lbs to 215-220 lbs.  She also has a history of HTN.  ROS: Constitutional: no weight gain/+ weight loss, + fatigue, no subjective hyperthermia, no subjective hypothermia Eyes: no blurry vision, no xerophthalmia ENT: no sore throat, no nodules palpated in neck, no dysphagia, no odynophagia, no hoarseness Cardiovascular: no CP/no SOB/no palpitations/no leg swelling Respiratory: no cough/no SOB/no wheezing Gastrointestinal: no N/no V/no D/no C/no acid reflux Musculoskeletal: no muscle aches/no joint aches Skin: no rashes, no hair loss Neurological: no tremors/no numbness/no tingling/no dizziness  I reviewed pt's medications, allergies, PMH, social hx, family hx, and changes were documented in  the history of present illness. Otherwise, unchanged from my initial visit note.  Past Medical History:  Diagnosis Date  . Allergic rhinitis   . Anxiety   . Atrial fibrillation ([place])   . Cancer ([place])    hx of skin cancers   . Chronic kidney disease   . Coronary artery disease    a. s/p MI & CABG; b. s/p PCI Diag;  c. Cath 12/2011: LAD diff dzs/small, Diag 75% isr, 90 dist to stent (small), LCX & RCA occluded, VG->OM 40, VG->PDA patent - med rx.  . Depression    PTSD  .  Diabetes mellitus    not on medications  . Diastolic CHF, chronic (HCC)    a. EF 55-60% by echo 2007  . Dysrhythmia    "abnormal beat"  . GERD (gastroesophageal reflux disease)    "not anymore" " I had a lap band"  . History of diverticulitis of colon    5 YRS AGO  . History of pneumonia    2017  . History of transient ischemic attack (TIA)    CAROTID DOPPLERS NOV 2011  0-39& BIL. STENOSIS  . Hyperlipidemia   . Hypertension   . Mitral regurgitation   . Myocardial infarction ([place])   . Neuromuscular disorder ([place])    left arm numbness   . 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 PTSD (post-traumatic stress disorder)    from [place]  . 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 Shortness of breath dyspnea    with exertion  . Sleep apnea    cpap- see ov note in [facility] 05/12/11 for settings    Past Surgical History:  Procedure Laterality Date  . AV FISTULA PLACEMENT Left 08/02/2018   Procedure: ARTERIOVENOUS (AV) FISTULA CREATION RADIOCEPHALIC;  Surgeon: Angelia Mould, MD;  Location: [place];  Service: Vascular;  Laterality: Left;  . BLEPHAROPLASTY  1985   BILATERAL  . CARDIAC CATHETERIZATION  2001, 2004, 2009, 2011  . CARDIAC CATHETERIZATION N/A 01/23/2016   Procedure: Right/Left Heart Cath and Coronary Angiography;  Surgeon: [name], MD;  Location: [place] CV LAB;  Service: Cardiovascular;  Laterality: N/A;  . CARDIOVERSION N/A 09/07/2017   Procedure: CARDIOVERSION;  Surgeon: [name], MD;  Location: [facility] ENDOSCOPY;  Service: Cardiovascular;  Laterality: N/A;  . CARDIOVERSION N/A 09/13/2019   Procedure: CARDIOVERSION;  Surgeon: [name], MD;  Location: [facility] ENDOSCOPY;  Service: Cardiovascular;  Laterality: N/A;   . CARDIOVERSION N/A 10/19/2019   Procedure: CARDIOVERSION;  Surgeon: [name], MD;  Location: [facility] ENDOSCOPY;  Service: Cardiovascular;  Laterality: N/A;  . CATARACT EXTRACTION W/ INTRAOCULAR LENS IMPLANT Bilateral   . CHONDROPLASTY  07/22/2011   Procedure: CHONDROPLASTY;  Surgeon: Dione Plover Aluisio;  Location: [place];  Service: Orthopedics;;  . CIRCUMCISION  35 YRS  AGO  . COLONOSCOPY    . CORONARY ANGIOPLASTY  1996-- POST CABG   W/ STENT, last cath 01/07/2012   . CORONARY ARTERY BYPASS GRAFT  1995   X3 VESSEL  . CORONARY STENT PLACEMENT    . KNEE ARTHROSCOPY  07/22/2011   Procedure: ARTHROSCOPY KNEE;  Surgeon: [name];  Location: [place];  Service: Orthopedics;  Laterality: Right;  WITH DEBRIDEMENT   . KNEE ARTHROSCOPY  W/ MENISCECTOMY  X2 IN 2002-- RIGHT KNEE  . LAPAROSCOPIC GASTRIC BANDING  01-15-09  . LEFT HEART CATH AND CORONARY ANGIOGRAPHY N/A 04/29/2017   Procedure: LEFT HEART CATH AND CORONARY ANGIOGRAPHY;  Surgeon: [name], MD;  Location: [facility] CV LAB;  Service: Cardiovascular;  Laterality: N/A;  . LEFT HEART CATHETERIZATION WITH CORONARY ANGIOGRAM N/A 09/11/2013   Procedure: LEFT HEART CATHETERIZATION WITH CORONARY ANGIOGRAM;  Surgeon: [name], MD;  Location: [facility] CATH LAB;  Service: Cardiovascular;  Laterality: N/A;  . MITRAL VALVE REPAIR Right 03/25/2016   Procedure: MINIMALLY INVASIVE REOPERATION FOR MITRAL VALVE REPAIR  (MVR) with size 26 Sorin Memo 3D;  Surgeon: [name], MD;  Location: [place];  Service: Open Heart Surgery;  Laterality: Right;  . RIGHT KNEE ED COMPARTMENT REPLACEMENT  2004  . SYNOVECTOMY  07/22/2011   Procedure: SYNOVECTOMY;  Surgeon: [name];  Location: [place];  Service: Orthopedics;;  . TEE WITHOUT CARDIOVERSION N/A 01/23/2016   Procedure: TRANSESOPHAGEAL ECHOCARDIOGRAM (TEE);  Surgeon: [name], MD;  Location: [place];  Service: Cardiovascular;   Laterality: N/A;  . TEE WITHOUT CARDIOVERSION N/A 03/25/2016   Procedure: TRANSESOPHAGEAL ECHOCARDIOGRAM (TEE);  Surgeon: [name], MD;  Location: [place];  Service: Open Heart Surgery;  Laterality: N/A;  . UMBILICAL HERNIA REPAIR  01-15-09   W/ GASTRIC BANDING PROCEDURE   Social History   Socioeconomic History  . Marital status: Married    Spouse name: Not on file  . Number of children: 1  . Years of education: Not on file  . Highest education level: Not on file  Occupational History  . Contractor, semi-retired  Social Needs  . Financial resource strain: Not on file  . Food insecurity:    Worry: Not on file    Inability: Not on file  . Transportation needs:    Medical: Not on file    Non-medical: Not on file  Tobacco Use  . Smoking status: Never Smoker  . Smokeless tobacco: Never Used  Substance and Sexual Activity  . Alcohol use: Yes, liquor    Comment: OCCASIONAL  . Drug use: No  . Sexual activity: Not on file  Lifestyle  . Physical activity:    Days per week: Not on file    Minutes per session: Not on file  . Stress: Not on file  Relationships  . Social connections:    Talks on phone: Not on file    Gets together: Not on file    Attends religious service: Not on file    Active member of club or organization: Not on file    Attends meetings of clubs or organizations: Not on file    Relationship status: Not on file  . Intimate partner violence:    Fear of current or ex partner: Not on file    Emotionally abused: Not on file    Physically abused: Not on file    Forced sexual activity: Not on file  Other Topics Concern  . Not on file  Social History Narrative  . Not on file   Current Outpatient Medications on File Prior to Visit  Medication Sig Dispense Refill  . Alirocumab (PRALUENT) 75 MG/ML SOAJ Inject 75 mg into the skin every 14 (fourteen) days.    Marland Kitchen allopurinol (ZYLOPRIM) 100 MG tablet Take 100 mg by mouth in the morning and at bedtime.     Marland Kitchen  amiodarone (PACERONE) 200 MG tablet Take 1 tablet (200 mg total)  by mouth daily. 60 tablet 0  . amoxicillin (AMOXIL) 500 MG tablet Take 4 tablets (2 g) by mouth 30-60 minutes prior to dental appointment (Patient taking differently: Take 2,000 mg by mouth See admin instructions. Take 4 tablets (2 g) by mouth 30-60 minutes prior to dental appointment) 12 tablet 0  . apixaban (ELIQUIS) 5 MG TABS tablet Take 1 tablet (5 mg total) by mouth 2 (two) times daily. 180 tablet 3  . Artificial Saliva (MOUTH KOTE MT) Use as directed 4 sprays in the mouth or throat every 3 (three) hours as needed (for dry mouth).    . calcitRIOL (ROCALTROL) 0.25 MCG capsule Take 0.25 mcg by mouth at bedtime.     . carvedilol (COREG) 3.125 MG tablet Take 1 tablet (3.125 mg total) by mouth daily. 60 tablet 5  . cetirizine (ZYRTEC) 10 MG tablet Take 10 mg by mouth daily.     . colchicine 0.6 MG tablet Take 0.6-1.2 mg by mouth 2 (two) times daily as needed (gout).     . furosemide (LASIX) 40 MG tablet Take 80 mg by mouth daily.     Marland Kitchen glipiZIDE (GLUCOTROL) 5 MG tablet Take 1 tablet (5 mg total) by mouth daily before supper. (Patient taking differently: Take 5 mg by mouth daily. ) 90 tablet 3  . HYDROcodone-acetaminophen (NORCO/VICODIN) 5-325 MG tablet Take 0.5 tablets by mouth as needed for moderate pain.     . Hypromellose (ARTIFICIAL TEARS OP) Apply 1 drop to eye 4 (four) times daily as needed (dry eyes).     . nitroGLYCERIN (NITROSTAT) 0.4 MG SL tablet PLACE 1 TABLET UNDER THE TONGUE EVERY 5 MINUTES AS NEEDED FOR CHEST PAIN UP TO 3 DOSES, IF SYMPTOMS PERSIST CALL 911 25 tablet 3  . pantoprazole (PROTONIX) 40 MG tablet Take 40 mg by mouth daily as needed (acid reflux/indigestion.).     Marland Kitchen potassium chloride SA (K-DUR,KLOR-CON) 20 MEQ tablet Take 20 mEq by mouth daily.    . Semaglutide,0.25 or 0.5MG /DOS, (OZEMPIC, 0.25 OR 0.5 MG/DOSE,) 2 MG/1.5ML SOPN Inject 0.5 mg into the skin once a week. (Patient taking differently: Inject 0.5 mg  into the skin every Thursday. ) 3 pen 3  . sildenafil (VIAGRA) 50 MG tablet Take 1 tablet (50 mg total) by mouth daily as needed for erectile dysfunction. Do not take nitroglycerin with this medication 10 tablet 3  . tadalafil (CIALIS) 20 MG tablet TAKE 1 TABLET BY MOUTH ONCE DAILY AS NEEDED 10 tablet 0  . testosterone cypionate (DEPOTESTOSTERONE CYPIONATE) 200 MG/ML injection Inject 400 mg into the muscle once a week.     . traZODone (DESYREL) 100 MG tablet Take 100 mg by mouth at bedtime.     No current facility-administered medications on file prior to visit.   Allergies  Allergen Reactions  . Crestor [Rosuvastatin] Other (See Comments)    Muscle aches   . Lipitor [Atorvastatin] Other (See Comments)    Muscle aches  . Pravastatin Other (See Comments)    Muscle aches  . Codeine Itching and Other (See Comments)    Extremity tingling-- can take synthetic   Family History  Problem Relation Age of Onset  . Other Mother        joint problems  . Hypertension Father   . Heart disease Father   . CVA Father   . Depression Father   . Heart disease Sister        CABG  . Stroke Sister   . Hypertension Sister   .  Congestive Heart Failure Sister   . Diabetes Sister   . Heart disease Brother   . Hypertension Brother   . Congestive Heart Failure Brother     PE: BP 130/60   Pulse 60   Ht 5\' 8"  (1.727 m)   Wt 203 lb (92.1 kg)   SpO2 98%   BMI 30.87 kg/m  Wt Readings from Last 3 Encounters:  01/29/20 203 lb (92.1 kg)  10/31/19 209 lb 9.6 oz (95.1 kg)  10/19/19 200 lb (90.7 kg)   Constitutional: overweight, in NAD Eyes: PERRLA, EOMI, no exophthalmos ENT: moist mucous membranes, no thyromegaly, no cervical lymphadenopathy Cardiovascular: RRR now, No MRG Respiratory: CTA B Gastrointestinal: abdomen soft, NT, ND, BS+ Musculoskeletal: no deformities, strength intact in all 4 Skin: moist, warm, no rashes Neurological: no tremor with outstretched hands, DTR normal in all  4  ASSESSMENT: 1. DM2, non-insulin-dependent, uncontrolled, with long-term complications - CAD, s/p coronaroplasty, then CABG 1995 - cardiologist - Dr. Aundra Dubin - CHF, EF 64 to 65% - Atrial fibrillation, cardioversion in 2019 - Carotid artery disease - Cerebrovascular disease, s/p TIA - CKD stage V, pending dialysis -Dr. Jimmy Footman - ED  2. HL  3.  Obesity class I  PLAN:  1. Patient with longstanding, uncontrolled, type 2 diabetes, off Metformin due to worsening kidney function.  We also cannot use an SGLT2 inhibitor for him due to his poor kidney function.  We had a hard time obtaining Ozempic for him, but he is currently on this with good results.  Latest HbA1c was 7.1%, stable.  His sugars improved in the morning at last visit, but some of them were still above goal, related to dietary indiscretions the night before.  States overall his sugars were better, we continued the same regimen but we discussed that he may need to take glipizide before a large dinner - At this visit, sugars are excellent whenever he checks, but he mainly checks in the morning.  Today, HbA1c: 5.9% (better). -We discussed you about the importance of checking sugars later in the day, also, to make sure he does not have any lows, but for now, to make it safer for him, I advised him to stop glipizide and continue only with Ozempic.  He will start checking sugars at different times of the day and let me know if the sugars increase. - I suggested to:  Patient Instructions  Please continue: - Ozempic 0.5 mg weekly   Stop Glipizide.  Please return in 4 months with your sugar log.   - advised to check sugars at different times of the day - 1x a day, rotating check times - advised for yearly eye exams >> he is UTD - return to clinic in 4 months   2. HL - Reviewed latest lipid panel from 06/2019: LDL close to goal, triglycerides slightly high, HDL slightly low: Lab Results  Component Value Date   CHOL 149 07/11/2019    HDL 38 (L) 07/11/2019   LDLCALC 73 07/11/2019   TRIG 188 (H) 07/11/2019   CHOLHDL 3.9 07/11/2019  - Continues Praluent without side effects.  He had side effects from statins in the past.  3.  Obesity class I -Continue Ozempic which should also help with weight loss - He gained 5 pounds before last visit, 5 more lbs since then  Philemon Kingdom, MD PhD [facility] Endocrinology

## 2020-01-31 DIAGNOSIS — Z85828 Personal history of other malignant neoplasm of skin: Secondary | ICD-10-CM | POA: Diagnosis not present

## 2020-01-31 DIAGNOSIS — L570 Actinic keratosis: Secondary | ICD-10-CM | POA: Diagnosis not present

## 2020-01-31 DIAGNOSIS — D0461 Carcinoma in situ of skin of right upper limb, including shoulder: Secondary | ICD-10-CM | POA: Diagnosis not present

## 2020-01-31 DIAGNOSIS — D485 Neoplasm of uncertain behavior of skin: Secondary | ICD-10-CM | POA: Diagnosis not present

## 2020-01-31 DIAGNOSIS — L821 Other seborrheic keratosis: Secondary | ICD-10-CM | POA: Diagnosis not present

## 2020-01-31 DIAGNOSIS — Q828 Other specified congenital malformations of skin: Secondary | ICD-10-CM | POA: Diagnosis not present

## 2020-01-31 DIAGNOSIS — D171 Benign lipomatous neoplasm of skin and subcutaneous tissue of trunk: Secondary | ICD-10-CM | POA: Diagnosis not present

## 2020-01-31 DIAGNOSIS — D044 Carcinoma in situ of skin of scalp and neck: Secondary | ICD-10-CM | POA: Diagnosis not present

## 2020-02-01 DIAGNOSIS — R972 Elevated prostate specific antigen [PSA]: Secondary | ICD-10-CM | POA: Diagnosis not present

## 2020-02-01 DIAGNOSIS — R3129 Other microscopic hematuria: Secondary | ICD-10-CM | POA: Diagnosis not present

## 2020-03-04 ENCOUNTER — Other Ambulatory Visit (HOSPITAL_COMMUNITY): Payer: Self-pay | Admitting: *Deleted

## 2020-03-04 ENCOUNTER — Telehealth (HOSPITAL_COMMUNITY): Payer: Self-pay | Admitting: *Deleted

## 2020-03-04 NOTE — Telephone Encounter (Signed)
pts wife left VM stating pt was weak and needed an office visit with Dr.McLean. I called pt to get more information no answer/left vm requesting return call.

## 2020-03-05 ENCOUNTER — Other Ambulatory Visit (HOSPITAL_COMMUNITY): Payer: Self-pay | Admitting: Cardiology

## 2020-03-05 DIAGNOSIS — I2510 Atherosclerotic heart disease of native coronary artery without angina pectoris: Secondary | ICD-10-CM

## 2020-03-05 DIAGNOSIS — I208 Other forms of angina pectoris: Secondary | ICD-10-CM

## 2020-03-05 DIAGNOSIS — E785 Hyperlipidemia, unspecified: Secondary | ICD-10-CM

## 2020-03-05 DIAGNOSIS — R079 Chest pain, unspecified: Secondary | ICD-10-CM

## 2020-03-05 NOTE — Telephone Encounter (Signed)
Pt left vm requesting a return call and shortly after pts wife left vm stating patient is weaker she is scared he may have another blockage. Pt wife states his "brain is not working properly". Wife requests pt have an appointment with Dr.McLean. Dr.McLeans first available appointment is in September. Spoke with patients wife she said pt can barely walk to the car without being very tired. Pt is sleeping more often. Wife denies that patient has shortness of breath, or swelling, or chest pain but she said he is rapidly declining. Wife ask that I inform Dr.McLean and see what his recommends.   Routed to [place]

## 2020-03-06 NOTE — Telephone Encounter (Signed)
Pt aware via wife Add on 7/15

## 2020-03-06 NOTE — Telephone Encounter (Signed)
Can add on tomorrow if open spot.

## 2020-03-07 ENCOUNTER — Ambulatory Visit (HOSPITAL_COMMUNITY)
Admission: RE | Admit: 2020-03-07 | Discharge: 2020-03-07 | Disposition: A | Discharge status: Home / Self Care | Payer: Medicare Other | Source: Ambulatory Visit | Attending: Cardiology | Admitting: Cardiology

## 2020-03-07 ENCOUNTER — Other Ambulatory Visit: Payer: Self-pay

## 2020-03-07 ENCOUNTER — Encounter (HOSPITAL_COMMUNITY): Payer: Self-pay | Admitting: Cardiology

## 2020-03-07 VITALS — BP 98/64 | HR 52 | Wt 191.0 lb

## 2020-03-07 DIAGNOSIS — Z833 Family history of diabetes mellitus: Secondary | ICD-10-CM | POA: Diagnosis not present

## 2020-03-07 DIAGNOSIS — I5032 Chronic diastolic (congestive) heart failure: Secondary | ICD-10-CM | POA: Insufficient documentation

## 2020-03-07 DIAGNOSIS — R001 Bradycardia, unspecified: Secondary | ICD-10-CM | POA: Insufficient documentation

## 2020-03-07 DIAGNOSIS — N184 Chronic kidney disease, stage 4 (severe): Secondary | ICD-10-CM | POA: Insufficient documentation

## 2020-03-07 DIAGNOSIS — I2510 Atherosclerotic heart disease of native coronary artery without angina pectoris: Secondary | ICD-10-CM | POA: Diagnosis not present

## 2020-03-07 DIAGNOSIS — R634 Abnormal weight loss: Secondary | ICD-10-CM | POA: Insufficient documentation

## 2020-03-07 DIAGNOSIS — M109 Gout, unspecified: Secondary | ICD-10-CM | POA: Diagnosis not present

## 2020-03-07 DIAGNOSIS — G4733 Obstructive sleep apnea (adult) (pediatric): Secondary | ICD-10-CM | POA: Insufficient documentation

## 2020-03-07 DIAGNOSIS — E1122 Type 2 diabetes mellitus with diabetic chronic kidney disease: Secondary | ICD-10-CM | POA: Insufficient documentation

## 2020-03-07 DIAGNOSIS — Z8249 Family history of ischemic heart disease and other diseases of the circulatory system: Secondary | ICD-10-CM | POA: Insufficient documentation

## 2020-03-07 DIAGNOSIS — Z7901 Long term (current) use of anticoagulants: Secondary | ICD-10-CM | POA: Insufficient documentation

## 2020-03-07 DIAGNOSIS — I480 Paroxysmal atrial fibrillation: Secondary | ICD-10-CM | POA: Diagnosis not present

## 2020-03-07 DIAGNOSIS — E785 Hyperlipidemia, unspecified: Secondary | ICD-10-CM | POA: Diagnosis not present

## 2020-03-07 DIAGNOSIS — I252 Old myocardial infarction: Secondary | ICD-10-CM | POA: Insufficient documentation

## 2020-03-07 DIAGNOSIS — I130 Hypertensive heart and chronic kidney disease with heart failure and stage 1 through stage 4 chronic kidney disease, or unspecified chronic kidney disease: Secondary | ICD-10-CM | POA: Insufficient documentation

## 2020-03-07 DIAGNOSIS — Z8673 Personal history of transient ischemic attack (TIA), and cerebral infarction without residual deficits: Secondary | ICD-10-CM | POA: Diagnosis not present

## 2020-03-07 DIAGNOSIS — Z9884 Bariatric surgery status: Secondary | ICD-10-CM | POA: Diagnosis not present

## 2020-03-07 DIAGNOSIS — Z951 Presence of aortocoronary bypass graft: Secondary | ICD-10-CM | POA: Diagnosis not present

## 2020-03-07 DIAGNOSIS — I447 Left bundle-branch block, unspecified: Secondary | ICD-10-CM | POA: Diagnosis not present

## 2020-03-07 DIAGNOSIS — Z79899 Other long term (current) drug therapy: Secondary | ICD-10-CM | POA: Insufficient documentation

## 2020-03-07 DIAGNOSIS — I770 Arteriovenous fistula, acquired: Secondary | ICD-10-CM | POA: Diagnosis not present

## 2020-03-07 LAB — COMPREHENSIVE METABOLIC PANEL
ALT: 44 U/L (ref 0–44)
AST: 36 U/L (ref 15–41)
Albumin: 3.9 g/dL (ref 3.5–5.0)
Alkaline Phosphatase: 90 U/L (ref 38–126)
Anion gap: 11 (ref 5–15)
BUN: 36 mg/dL — ABNORMAL HIGH (ref 8–23)
CO2: 25 mmol/L (ref 22–32)
Calcium: 9.8 mg/dL (ref 8.9–10.3)
Chloride: 103 mmol/L (ref 98–111)
Creatinine, Ser: 3.66 mg/dL — ABNORMAL HIGH (ref 0.61–1.24)
GFR calc Af Amer: 18 mL/min — ABNORMAL LOW (ref >60)
GFR calc non Af Amer: 15 mL/min — ABNORMAL LOW (ref >60)
Glucose, Bld: 127 mg/dL — ABNORMAL HIGH (ref 70–99)
Potassium: 3.7 mmol/L (ref 3.5–5.1)
Sodium: 139 mmol/L (ref 135–145)
Total Bilirubin: 0.8 mg/dL (ref 0.3–1.2)
Total Protein: 6.1 g/dL — ABNORMAL LOW (ref 6.5–8.1)

## 2020-03-07 LAB — CBC
HCT: 42.2 % (ref 39.0–52.0)
Hemoglobin: 13.8 g/dL (ref 13.0–17.0)
MCH: 30.5 pg (ref 26.0–34.0)
MCHC: 32.7 g/dL (ref 30.0–36.0)
MCV: 93.4 fL (ref 80.0–100.0)
Platelets: 161 10*3/uL (ref 150–400)
RBC: 4.52 MIL/uL (ref 4.22–5.81)
RDW: 15 % (ref 11.5–15.5)
WBC: 6.4 10*3/uL (ref 4.0–10.5)
nRBC: 0 % (ref 0.0–0.2)

## 2020-03-07 LAB — TSH: TSH: 3.158 u[IU]/mL (ref 0.350–4.500)

## 2020-03-07 LAB — BRAIN NATRIURETIC PEPTIDE: B Natriuretic Peptide: 161.1 pg/mL — ABNORMAL HIGH (ref 0.0–100.0)

## 2020-03-07 NOTE — Patient Instructions (Addendum)
Routine lab work today. Will notify you of abnormal results   STOP Carvedilol  Do not take Lasix on 03/08/2020. On 03/09/2020 Start Lasix 40mg  daily.  Follow up with Dr.McLean in 3-4 months

## 2020-03-07 NOTE — Progress Notes (Signed)
Patient ID: [name], male   DOB: 1946/07/06, 74 y.o.   MRN: [id number] PCP: Dr Harrington Challenger Cardiology: Dr. Aundra Dubin  74 y.o. with history of CAD s/p CABG and obesity s/p lap banding procedure presents for cardiology followup.  Given exertional dyspnea and chest tightness, he had LHC in 1/15.  This showed no change from prior.  Grafts were patent and there was severe stenosis in distal D1, not amenable to PCI.   In 2017, he developed increased exertional dyspnea.  Eventually had a TEE showing severe MR with prolapse of a portion of the anterior mitral valve leaflet.  Coronary angiography in 6/17 showed patent grafts and 90% stenosis of distal diagonal not amenable to intervention.  In 8/17, he had mitral valve repair.  Post-op diastolic CHF, Lasix started and increased.   He had a cath again in 9/18 showing stable coronary anatomy.    He developed atrial arrhythmias in 12/18.  Both fibrillation and flutter were seen.  Saw Dr. Rayann Heman, recommended DCCV with amiodarone or sotalol if fibrillation recurs.  He had DCCV in 1/19.   In 6/19, he was noted to have developed AKI. Creatinine went up to 3.46.  Renal US was done, showing normal kidneys. Cause of AKI was not clear.  We stopped his Lasix. He saw nephrology and eventually went back on Lasix, currently taking 80 mg daily.  He is not using any NSAIDs.    He stopped pravastatin due to myalgias. We struggled to get him on a PCSK9 inhibitor through the [place] but he was finally able to get Praluent.    I had him do a Lexiscan Cardiolite in 11/19, which showed old inferior MI with no ischemia.  Echo in 11/19 showed EF 55-60%, s/p MV repair with mean gradient 6 mmHg.   Echo 12/20 showed EF 50-55%, mildly decreased RV systolic function, repaired mitral valve with mild MR, mean gradient 6 mmHg across MV.    He had DCCV from atrial fibrillation to NSR in 1/21 and again in 2/21.   Has had some hematuria. He was evaluated by Urology. The urine sample was negative  for hematuria. He was told he has some small kidney stones.   Today he returns for HF follow up.Overall feeling bad. Complaining of fatigue over the last 6-8 weeks.  SOB up the steps. Denies PND/Orthopnea. Denies BRBPR. Denies chest pain.  Appetite fair. No fever or chills. Weight at home trending down from 209-->  189  pounds. Taking all medications. [facility]-8 [facility]. He was ok to restart testosterone but Urology stopped   ECG (personally reviewed): NSR, 1st degree AVB 216 msec, IVCD 118 msec, poor RWP  Labs (11/12): LDL 70 Labs (5/13): K 4.1, creatinine 1.1, BNP 48 Labs (12/13): K 3.4, creatinine 1.07, LDL 59, HDL 37 Labs (1/15): K 4.3, creatinine 1.2, HCT 40.3 Labs (3/15): LDL 38, HDL 81, BNP 55, K 4, creatinine 1.2 Labs (6/15): K 4.2, creatinine 1.4, LFTs normal, LDL 66, HDL 35 Labs (8/16): K 4, creatinine 1.6, BNP 64 Labs (12/16): LDL 61, HDL 37 Labs (4/17): K 4.2, creatinine 1.35, HCT 38.7, BNP 80 Labs (8/17): K 4.2, creatinine 1.4 => 1.66, BNP 159 Labs (12/17): K 3.6, creatinine 1.75 Labs (2/18): LDL 76, HDL 36 Labs (9/18): K 3.9, creatinine 1.76 Labs (1/19): K 4.9, creatinine 1.76, hgb 14 Labs (6/19): K 3.6, creatinine 3.46 Labs (7/19): K 4.1, creatinine 3.66, hgb 12.7 Labs (10/19): LDL 177, hgb 13.4, troponin negative, K 4, creatinine  4.15 Labs (12/19): K 4.3, creatinine 3.88 Labs (4/20): K 3.6, creatinine 3.39 Labs (11/20): LDL 73, HDL 38 Labs (2/21): K 3.4, creatinine 3.8  PMH: 1. OSA: on CPAP.  2. Diabetes mellitus 3. Allergic rhinitis 4. CAD: s/p CABG 1995.  LHC (10/11) with 90% distal D1 (patent proximal D1 stent), total occlusion CFX, total occlusion RCA, no significant stenoses in LAD, SVG-PDA patent, SVG-OM patent, EF 55%.  Medical management.  LHC (5/13) with patent grafts and 90% distal D1 stenosis (no significant change from 10/11).  LHC (1/15) with patent grafts, 75% ISR in D1 stent, 90% stenosis distal D1 (small caliber). D1 not  amenable to intervention.  - Coronary angiography (6/17): grafts patent, 90% stenosis distally in large diagonal not amenable to intervention.   - LHC (9/18): SVG-OM and SVG-PDA patent,  75% in-stent restenosis in proximal diagonal then 90% stenosis distally (small in caliber at that point), totally occluded LCx and RCA.  Left coronary system similar to prior caths, D1 may be source of angina.  - Lexiscan Cardiolite (11/19): EF 47%, old inferior MI, no ischemia.  5. Obesity: lap-banding in 5/11.  6. Chronic diastolic CHF: Echo (1/32) with EF 55-60%, moderate LVH, moderate diastolic dysfunction, s/p mitral valve repair with elevated mean gradient but normal pressure half-time.  - Echo (8/18): EF 55-60%, s/p MV repair with mean gradient 6 mmHg, PASP 28 mmHg, mildly decreased RV systolic function.  - Echo (11/19): EF 55-60%, s/p MV repair with mean gradient 6, PASP 42 mmHg, mildly dilated RV, dilated IVC.  - Echo (12/20): EF 50-55%, mildly decreased RV systolic function. S/p MV repair with mean gradient 6 mmHg, mild MR.  7. TIA: Carotid dopplers in 11/11 with 0-39% bilateral stenosis.  Carotid dopplers 6/17 with 1-39% RICA stenosis.  8. OA: Knees, C-spine. TKR 9/13.  9. HTN 10. Hyperlipidemia: Myalgias with atorvastatin and Crestor. Now on Praluent.  11. Diverticulosis 12. MItral regurgitation: TEE (6/17) with EF 55-60%, severe eccentric MR, prolapse of a portion of the anterior leaflet, peak RV-RA gradient 45 mmHg.  - Mitral valve repair 8/17 13. CKD: Stage III.  14. Atrial fibrillation/atrial flutter: Both arrhythmias noted in 12/18.  DCCV 12/19.   - DCCV to NSR 1/21.  - DCCV to NSR 2/21.  15. AKI 6/19 => CKD stage 4: uncertain etiology.  Renal US was normal.  16. 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  Lives in [place].   Family History  Problem Relation Age of Onset  . Other Mother        joint problems  . Hypertension Father   . Heart disease Father   .  CVA Father   . Depression Father   . Heart disease Sister        CABG  . Stroke Sister   . Hypertension Sister   . Congestive Heart Failure Sister   . Diabetes Sister   . Heart disease Brother   . Hypertension Brother   . Congestive Heart Failure Brother     ROS: All systems reviewed and negative except as per HPI.    Current Outpatient Medications  Medication Sig Dispense Refill  . Alirocumab (PRALUENT) 75 MG/ML SOAJ Inject 75 mg into the skin every 14 (fourteen) days.    Marland Kitchen allopurinol (ZYLOPRIM) 100 MG tablet Take 100 mg by mouth in the morning and at bedtime.     Marland Kitchen amiodarone (PACERONE) 200 MG tablet Take 1 tablet (200 mg total) by mouth daily. 60 tablet 0  .  apixaban (ELIQUIS) 5 MG TABS tablet Take 1 tablet (5 mg total) by mouth 2 (two) times daily. 180 tablet 3  . Artificial Saliva (MOUTH KOTE MT) Use as directed 4 sprays in the mouth or throat every 3 (three) hours as needed (for dry mouth).    . calcitRIOL (ROCALTROL) 0.25 MCG capsule Take 0.25 mcg by mouth at bedtime.     . carvedilol (COREG) 3.125 MG tablet Take 1 tablet (3.125 mg total) by mouth daily. 60 tablet 5  . cetirizine (ZYRTEC) 10 MG tablet Take 10 mg by mouth daily.     . colchicine 0.6 MG tablet Take 0.6-1.2 mg by mouth 2 (two) times daily as needed (gout).     . furosemide (LASIX) 40 MG tablet Take 80 mg by mouth daily.     Marland Kitchen HYDROcodone-acetaminophen (NORCO/VICODIN) 5-325 MG tablet Take 0.5 tablets by mouth as needed for moderate pain.     . Hypromellose (ARTIFICIAL TEARS OP) Apply 1 drop to eye 4 (four) times daily as needed (dry eyes).     . nitroGLYCERIN (NITROSTAT) 0.4 MG SL tablet 1 TAB UNDER THE TONGUE EVERY 5 MINUTES AS NEEDED FOR CHEST PAIN UP TO 3 DOSES, IF PERSIST CALL 911 75 tablet 1  . pantoprazole (PROTONIX) 40 MG tablet Take 40 mg by mouth daily as needed (acid reflux/indigestion.).     Marland Kitchen potassium chloride SA (K-DUR,KLOR-CON) 20 MEQ tablet Take 20 mEq by mouth daily.    . Semaglutide,0.25 or  0.5MG /DOS, (OZEMPIC, 0.25 OR 0.5 MG/DOSE,) 2 MG/1.5ML SOPN Inject 0.375 mLs (0.5 mg total) into the skin every Thursday. 3 pen 3  . sildenafil (VIAGRA) 50 MG tablet Take 1 tablet (50 mg total) by mouth daily as needed for erectile dysfunction. Do not take nitroglycerin with this medication 10 tablet 3  . tadalafil (CIALIS) 20 MG tablet TAKE 1 TABLET BY MOUTH ONCE DAILY AS NEEDED 10 tablet 0  . testosterone cypionate (DEPOTESTOSTERONE CYPIONATE) 200 MG/ML injection Inject 400 mg into the muscle once a week.     . traZODone (DESYREL) 100 MG tablet Take 100 mg by mouth at bedtime.    Marland Kitchen amoxicillin (AMOXIL) 500 MG tablet Take 4 tablets (2 g) by mouth 30-60 minutes prior to dental appointment (Patient not taking: Reported on 01/29/2020) 12 tablet 0   No current facility-administered medications for this encounter.    BP 98/64   Pulse (!) 52   Wt 86.6 kg (191 lb)   SpO2 98%   BMI 29.04 kg/m    Wt Readings from Last 3 Encounters:  03/07/20 86.6 kg (191 lb)  01/29/20 92.1 kg (203 lb)  10/31/19 95.1 kg (209 lb 9.6 oz)    General:  Well appearing. No resp difficulty HEENT: normal Neck: supple. no JVD. Carotids 2+ bilat; no bruits. No lymphadenopathy or thryomegaly appreciated. Cor: PMI nondisplaced. Regular rate & rhythm. No rubs, gallops or murmurs. Lungs: clear Abdomen: soft, nontender, nondistended. No hepatosplenomegaly. No bruits or masses. Good bowel sounds. Extremities: no cyanosis, clubbing, rash, edema Neuro: alert & orientedx3, cranial nerves grossly intact. moves all 4 extremities w/o difficulty. Affect pleasant  EKG: Sinus Bradycardia 50 bpm    Assessment/Plan: 1. S/p mitral valve repair:  Echo 12/20 showed stable mitral valve repair compared to prior echo with mild MR, mean gradient 6 mmHg.   - With prosthetic material, should use antibiotic prophylaxis with dental work.  2. CAD:  He has severe stenosis in a distal D1 that is not amenable to intervention.  This has  been stable  on last couple of caths. He has occluded RCA and LCx known from the past with SVGs to OM and PDA.  EF remains preserved by echo. Cardiolite in 11/19 showed no ischemia. No chest pain.  - No ASA with apixaban use.  - With low BP and fatigue, we will stop coreg  - He cannot tolerate statins with myalgias, Now on Praluent.  Good lipids in 11/20.   3. Hyperlipidemia: Myalgias with Crestor, pravastatin, and atorvastatin.  We were finally able to get him Praluent through the [place].  - Lipids in 11/20 ok.  4. Chronic diastolic CHF:  nYHA  NYHA III. Volume status low. Hold lasix tomorrow then he will start lasix 40 mg daily.  - With fatigue and bradycardia. Stop coreg.  5. OSA: Continue CPAP.  6. Atrial fibrillation: Paroxysmal, in NSR after DCCV in 2/21.  He is now on amiodarone to maintain NSR given rapid recurrence of AF after DCCV in 1/21.  Probably not a good ablation candidate with size of atria.   - Maintaining NSR.  - Continue apixaban 5 mg bid.  - Continue amiodarone 200 mg daily. Check TSH, CBC today  7. CKD stage 4: AKI without recovery, uncertain etiology.  He sees nephrology, there is concern that he is progressing towards HD. He now has AV fistula. Check BMET  8. Diabetes: He is now followed by endocrinology. 9. Fatigue.  - Check CBC, TSH today.  - He has been on testosterone for the last 6-8 weeks. It is possible fatigue is from this.  - Check testosterone level. Forward results to PCP. Ok to restart if low.    Today we will check BMET, CBC, BNP, TSH.  Hold lasix 7/14 and then cut back to 40 mg daily Stop carvedilol.  Follow up with Dr Aundra Dubin in 2-3 months.     Amy Clegg NP-C  03/07/2020  Patient seen with NP, agree with the above note.   Mr Hendershott reports significantly increased fatigue x 3-4 months.  He is tired walking to the mailbox (though not particularly short of breath).  No chest pain.  He has been off testosterone, says the [place] stopped providing it to him.  Weight is down  18 lbs since I last saw him, no appetite.   ECG: sinus brady 50, LBBB 126 msec  General: NAD Neck: No JVD, no thyromegaly or thyroid nodule.  Lungs: Clear to auscultation bilaterally with normal respiratory effort. CV: Nondisplaced PMI.  Heart regular S1/S2, no S3/S4, no murmur.  No peripheral edema.  No carotid bruit.  Normal pedal pulses.  Abdomen: Soft, nontender, no hepatosplenomegaly, no distention.  Skin: Intact without lesions or rashes.  Neurologic: Alert and oriented x 3.  Psych: Normal affect. Extremities: No clubbing or cyanosis.  HEENT: Normal.   Patient is not volume overloaded on exam.  HR is slow around 50.  BP is on the low side at 98/64.  - I will stop Coreg with bradycardia/low BP and decrease Lasix to 40 mg daily.   - Check CBC for anemia.  - BMET to assess renal function, ?worsening with uremia.  - Check TSH - Check testosterone => if low, would be reasonable to restart replacemennt.   Significant weight loss and fatigue is also concerning for malignancy. He had recent workup for hematuria with urology, sounds like this was likely due to nephrolithiasis.  He denies overt blood in stool.  I suggested that he get an appt with PCP to make sure all  his basic cancer screenings are up to date.   Followup in 3-4 months with me.   [name] 03/07/2020

## 2020-03-08 ENCOUNTER — Other Ambulatory Visit: Payer: Self-pay | Admitting: Internal Medicine

## 2020-03-08 LAB — TESTOSTERONE: Testosterone: 175 ng/dL — ABNORMAL LOW (ref 264–916)

## 2020-03-14 DIAGNOSIS — R634 Abnormal weight loss: Secondary | ICD-10-CM | POA: Diagnosis not present

## 2020-03-14 DIAGNOSIS — E291 Testicular hypofunction: Secondary | ICD-10-CM | POA: Diagnosis not present

## 2020-03-14 DIAGNOSIS — D6869 Other thrombophilia: Secondary | ICD-10-CM | POA: Diagnosis not present

## 2020-03-22 ENCOUNTER — Encounter (HOSPITAL_COMMUNITY): Payer: Medicare Other | Admitting: Cardiology

## 2020-03-25 DIAGNOSIS — N184 Chronic kidney disease, stage 4 (severe): Secondary | ICD-10-CM | POA: Diagnosis not present

## 2020-03-25 DIAGNOSIS — M129 Arthropathy, unspecified: Secondary | ICD-10-CM | POA: Diagnosis not present

## 2020-04-08 DIAGNOSIS — R1011 Right upper quadrant pain: Secondary | ICD-10-CM | POA: Diagnosis not present

## 2020-04-08 DIAGNOSIS — M545 Low back pain: Secondary | ICD-10-CM | POA: Diagnosis not present

## 2020-04-08 DIAGNOSIS — N184 Chronic kidney disease, stage 4 (severe): Secondary | ICD-10-CM | POA: Diagnosis not present

## 2020-04-08 DIAGNOSIS — Z7984 Long term (current) use of oral hypoglycemic drugs: Secondary | ICD-10-CM | POA: Diagnosis not present

## 2020-04-08 DIAGNOSIS — R197 Diarrhea, unspecified: Secondary | ICD-10-CM | POA: Diagnosis not present

## 2020-04-08 DIAGNOSIS — E1122 Type 2 diabetes mellitus with diabetic chronic kidney disease: Secondary | ICD-10-CM | POA: Diagnosis not present

## 2020-04-08 DIAGNOSIS — M549 Dorsalgia, unspecified: Secondary | ICD-10-CM | POA: Insufficient documentation

## 2020-04-08 DIAGNOSIS — R140 Abdominal distension (gaseous): Secondary | ICD-10-CM | POA: Diagnosis not present

## 2020-04-08 DIAGNOSIS — Z9884 Bariatric surgery status: Secondary | ICD-10-CM | POA: Diagnosis not present

## 2020-04-10 DIAGNOSIS — I4891 Unspecified atrial fibrillation: Secondary | ICD-10-CM | POA: Diagnosis not present

## 2020-04-10 DIAGNOSIS — I120 Hypertensive chronic kidney disease with stage 5 chronic kidney disease or end stage renal disease: Secondary | ICD-10-CM | POA: Diagnosis not present

## 2020-04-10 DIAGNOSIS — D631 Anemia in chronic kidney disease: Secondary | ICD-10-CM | POA: Diagnosis not present

## 2020-04-10 DIAGNOSIS — I509 Heart failure, unspecified: Secondary | ICD-10-CM | POA: Diagnosis not present

## 2020-04-10 DIAGNOSIS — I2510 Atherosclerotic heart disease of native coronary artery without angina pectoris: Secondary | ICD-10-CM | POA: Diagnosis not present

## 2020-04-10 DIAGNOSIS — Z9889 Other specified postprocedural states: Secondary | ICD-10-CM | POA: Diagnosis not present

## 2020-04-10 DIAGNOSIS — N185 Chronic kidney disease, stage 5: Secondary | ICD-10-CM | POA: Diagnosis not present

## 2020-04-10 DIAGNOSIS — E1122 Type 2 diabetes mellitus with diabetic chronic kidney disease: Secondary | ICD-10-CM | POA: Diagnosis not present

## 2020-04-10 DIAGNOSIS — M109 Gout, unspecified: Secondary | ICD-10-CM | POA: Diagnosis not present

## 2020-04-10 DIAGNOSIS — I770 Arteriovenous fistula, acquired: Secondary | ICD-10-CM | POA: Diagnosis not present

## 2020-04-10 DIAGNOSIS — N2581 Secondary hyperparathyroidism of renal origin: Secondary | ICD-10-CM | POA: Diagnosis not present

## 2020-04-10 DIAGNOSIS — R319 Hematuria, unspecified: Secondary | ICD-10-CM | POA: Diagnosis not present

## 2020-04-11 ENCOUNTER — Other Ambulatory Visit: Payer: Self-pay

## 2020-04-11 ENCOUNTER — Ambulatory Visit
Admission: RE | Admit: 2020-04-11 | Discharge: 2020-04-11 | Disposition: A | Discharge status: Home / Self Care | Payer: Medicare Other | Source: Ambulatory Visit | Attending: Family Medicine | Admitting: Family Medicine

## 2020-04-11 ENCOUNTER — Other Ambulatory Visit: Payer: Self-pay | Admitting: Family Medicine

## 2020-04-11 DIAGNOSIS — N200 Calculus of kidney: Secondary | ICD-10-CM | POA: Diagnosis not present

## 2020-04-11 DIAGNOSIS — Z9884 Bariatric surgery status: Secondary | ICD-10-CM | POA: Diagnosis not present

## 2020-04-11 DIAGNOSIS — R197 Diarrhea, unspecified: Secondary | ICD-10-CM | POA: Diagnosis not present

## 2020-04-11 DIAGNOSIS — N184 Chronic kidney disease, stage 4 (severe): Secondary | ICD-10-CM | POA: Diagnosis not present

## 2020-04-11 DIAGNOSIS — E1122 Type 2 diabetes mellitus with diabetic chronic kidney disease: Secondary | ICD-10-CM | POA: Diagnosis not present

## 2020-04-11 DIAGNOSIS — I700 Atherosclerosis of aorta: Secondary | ICD-10-CM | POA: Diagnosis not present

## 2020-04-11 DIAGNOSIS — K529 Noninfective gastroenteritis and colitis, unspecified: Secondary | ICD-10-CM | POA: Diagnosis not present

## 2020-04-11 DIAGNOSIS — Z7984 Long term (current) use of oral hypoglycemic drugs: Secondary | ICD-10-CM | POA: Diagnosis not present

## 2020-04-11 DIAGNOSIS — K8020 Calculus of gallbladder without cholecystitis without obstruction: Secondary | ICD-10-CM | POA: Diagnosis not present

## 2020-04-11 DIAGNOSIS — R109 Unspecified abdominal pain: Secondary | ICD-10-CM

## 2020-04-11 DIAGNOSIS — K5730 Diverticulosis of large intestine without perforation or abscess without bleeding: Secondary | ICD-10-CM | POA: Diagnosis not present

## 2020-04-11 DIAGNOSIS — R1011 Right upper quadrant pain: Secondary | ICD-10-CM | POA: Diagnosis not present

## 2020-04-11 DIAGNOSIS — R140 Abdominal distension (gaseous): Secondary | ICD-10-CM | POA: Diagnosis not present

## 2020-04-15 DIAGNOSIS — K529 Noninfective gastroenteritis and colitis, unspecified: Secondary | ICD-10-CM | POA: Diagnosis not present

## 2020-04-15 DIAGNOSIS — R197 Diarrhea, unspecified: Secondary | ICD-10-CM | POA: Diagnosis not present

## 2020-04-16 DIAGNOSIS — K529 Noninfective gastroenteritis and colitis, unspecified: Secondary | ICD-10-CM | POA: Diagnosis not present

## 2020-04-16 DIAGNOSIS — R197 Diarrhea, unspecified: Secondary | ICD-10-CM | POA: Diagnosis not present

## 2020-04-17 ENCOUNTER — Telehealth: Payer: Self-pay | Admitting: *Deleted

## 2020-04-17 NOTE — Telephone Encounter (Signed)
Can hold 2 days if GI prefers.

## 2020-04-17 NOTE — Telephone Encounter (Signed)
Pharmacy please address anticoagulation and then I will contact the patient.  Kerin Ransom PA-C 04/17/2020 12:03 PM

## 2020-04-17 NOTE — Telephone Encounter (Signed)
° °  [facility] Pre-operative Risk Assessment    HEARTCARE STAFF: - Please ensure there is not already an duplicate clearance open for this procedure. - Under Visit Info/Reason for Call, type in Other and utilize the format Clearance MM/DD/YY or Clearance TBD. Do not use dashes or single digits. - If request is for dental extraction, please clarify the # of teeth to be extracted.  Request for surgical clearance:  1. What type of surgery is being performed? COLONOSCOPY WITH POSSIBLE ENDOSCOPY TO BE DONE AS WELL   2. When is this surgery scheduled? TBD   3. What type of clearance is required (medical clearance vs. Pharmacy clearance to hold med vs. Both)? BOTH  4. Are there any medications that need to be held prior to surgery and how long? APIXABAN  5. Practice name and name of physician performing surgery? [facility]; DR. MAGOD   6. What is the office phone number? [phone]   7.   What is the office fax number? [phone]  8.   Anesthesia type (None, local, MAC, general) ? NOT LISTED   Julaine Hua 04/17/2020, 11:10 AM  _________________________________________________________________   (provider comments below)

## 2020-04-17 NOTE — Telephone Encounter (Signed)
   Primary Cardiologist: Dr Aundra Dubin  Chart reviewed and the patient was contacted today by phone as part of pre-operative protocol coverage. Given past medical history and time since last visit, based on ACC/AHA guidelines, [name] would be at acceptable risk for the planned procedure without further cardiovascular testing.   OK to hold Eliquis 2 days pre op, resume as soon as safe post op.   I will route this recommendation to the requesting party via Epic fax function and remove from pre-op pool.  Please call with questions.  Kerin Ransom, PA-C 04/17/2020, 2:56 PM

## 2020-04-17 NOTE — Telephone Encounter (Signed)
Patient with diagnosis of afib on Eliquis for anticoagulation.    Procedure: colonoscopy Date of procedure: TBD  CHADS2-VASc score of 7 (age, CHF, HTN, DM, CAD, TIA)  CrCl 4mL/min Platelet count 161K  Prefer to minimize time off of anticoagulation due to elevated cardiovascular risk, however in setting of renal dysfunction, will defer to MD to see if 1 or 2 day hold is more appropriate.

## 2020-04-30 DIAGNOSIS — Z1159 Encounter for screening for other viral diseases: Secondary | ICD-10-CM | POA: Diagnosis not present

## 2020-05-03 DIAGNOSIS — Z9884 Bariatric surgery status: Secondary | ICD-10-CM | POA: Diagnosis not present

## 2020-05-03 DIAGNOSIS — K5730 Diverticulosis of large intestine without perforation or abscess without bleeding: Secondary | ICD-10-CM | POA: Diagnosis not present

## 2020-05-03 DIAGNOSIS — K449 Diaphragmatic hernia without obstruction or gangrene: Secondary | ICD-10-CM | POA: Diagnosis not present

## 2020-05-03 DIAGNOSIS — R197 Diarrhea, unspecified: Secondary | ICD-10-CM | POA: Diagnosis not present

## 2020-05-03 DIAGNOSIS — R1013 Epigastric pain: Secondary | ICD-10-CM | POA: Diagnosis not present

## 2020-05-03 DIAGNOSIS — K229 Disease of esophagus, unspecified: Secondary | ICD-10-CM | POA: Diagnosis not present

## 2020-05-07 DIAGNOSIS — R197 Diarrhea, unspecified: Secondary | ICD-10-CM | POA: Diagnosis not present

## 2020-05-07 DIAGNOSIS — K5730 Diverticulosis of large intestine without perforation or abscess without bleeding: Secondary | ICD-10-CM | POA: Diagnosis not present

## 2020-05-16 ENCOUNTER — Telehealth (HOSPITAL_COMMUNITY): Payer: Self-pay | Admitting: Vascular Surgery

## 2020-05-16 ENCOUNTER — Telehealth (HOSPITAL_COMMUNITY): Payer: Self-pay

## 2020-05-16 NOTE — Telephone Encounter (Signed)
[facility] need to reschedule 10/12 appt with dm to nxt ava.

## 2020-05-20 DIAGNOSIS — R0981 Nasal congestion: Secondary | ICD-10-CM | POA: Diagnosis not present

## 2020-05-21 DIAGNOSIS — R0981 Nasal congestion: Secondary | ICD-10-CM | POA: Diagnosis not present

## 2020-05-24 ENCOUNTER — Telehealth: Payer: Self-pay | Admitting: Nurse Practitioner

## 2020-05-24 ENCOUNTER — Ambulatory Visit (HOSPITAL_COMMUNITY)
Admission: RE | Admit: 2020-05-24 | Discharge: 2020-05-24 | Disposition: A | Discharge status: Home / Self Care | Payer: Medicare Other | Source: Ambulatory Visit | Attending: Pulmonary Disease | Admitting: Pulmonary Disease

## 2020-05-24 ENCOUNTER — Encounter: Payer: Self-pay | Admitting: Nurse Practitioner

## 2020-05-24 ENCOUNTER — Other Ambulatory Visit: Payer: Self-pay | Admitting: Nurse Practitioner

## 2020-05-24 DIAGNOSIS — I2510 Atherosclerotic heart disease of native coronary artery without angina pectoris: Secondary | ICD-10-CM | POA: Insufficient documentation

## 2020-05-24 DIAGNOSIS — N182 Chronic kidney disease, stage 2 (mild): Secondary | ICD-10-CM

## 2020-05-24 DIAGNOSIS — U071 COVID-19: Secondary | ICD-10-CM | POA: Insufficient documentation

## 2020-05-24 DIAGNOSIS — E119 Type 2 diabetes mellitus without complications: Secondary | ICD-10-CM | POA: Insufficient documentation

## 2020-05-24 DIAGNOSIS — Z23 Encounter for immunization: Secondary | ICD-10-CM | POA: Insufficient documentation

## 2020-05-24 DIAGNOSIS — I10 Essential (primary) hypertension: Secondary | ICD-10-CM

## 2020-05-24 DIAGNOSIS — I129 Hypertensive chronic kidney disease with stage 1 through stage 4 chronic kidney disease, or unspecified chronic kidney disease: Secondary | ICD-10-CM | POA: Insufficient documentation

## 2020-05-24 MED ORDER — METHYLPREDNISOLONE SODIUM SUCC 125 MG IJ SOLR: 125.0000 mg | Freq: Once | INTRAMUSCULAR | Status: DC | PRN

## 2020-05-24 MED ORDER — SODIUM CHLORIDE 0.9 % IV SOLN: INTRAVENOUS | Status: DC | PRN

## 2020-05-24 MED ORDER — ALBUTEROL SULFATE HFA 108 (90 BASE) MCG/ACT IN AERS: 2.0000 | INHALATION_SPRAY | Freq: Once | RESPIRATORY_TRACT | Status: DC | PRN

## 2020-05-24 MED ORDER — FAMOTIDINE IN NACL 20-0.9 MG/50ML-% IV SOLN: 20.0000 mg | Freq: Once | INTRAVENOUS | Status: DC | PRN

## 2020-05-24 MED ORDER — DIPHENHYDRAMINE HCL 50 MG/ML IJ SOLN: 50.0000 mg | Freq: Once | INTRAMUSCULAR | Status: DC | PRN

## 2020-05-24 MED ORDER — EPINEPHRINE 0.3 MG/0.3ML IJ SOAJ: 0.3000 mg | Freq: Once | INTRAMUSCULAR | Status: DC | PRN

## 2020-05-24 MED ORDER — SODIUM CHLORIDE 0.9 % IV SOLN
1200.0000 mg | Freq: Once | INTRAVENOUS | Status: AC
  Administered 2020-05-24: 1200 mg via INTRAVENOUS

## 2020-05-24 NOTE — Discharge Instructions (Signed)

## 2020-05-24 NOTE — Progress Notes (Signed)
  Diagnosis: COVID-19  Physician: Dr. Asencion Noble  Procedure: [facility] Med: casirivimab\imdevimab infusion - Provided patient with casirivimab\imdevimab fact sheet for patients, parents and caregivers prior to infusion.  Complications: No immediate complications noted.  Discharge: Discharged home   [name] 05/24/2020

## 2020-05-24 NOTE — Telephone Encounter (Signed)
I connected by phone with [name] on 05/24/2020 at 3:39 PM to discuss the potential use of a new treatment for mild to moderate COVID-19 viral infection in non-hospitalized patients.  This patient is a 74 y.o. male that meets the FDA criteria for Emergency Use Authorization of COVID monoclonal antibody casirivimab/imdevimab.  Has a (+) direct SARS-CoV-2 viral test result  Has mild or moderate COVID-19   Is NOT hospitalized due to COVID-19  Is within 10 days of symptom onset  Has at least one of the high risk factor(s) for progression to severe COVID-19 and/or hospitalization as defined in EUA.  Specific high risk criteria : Older age (>/= 75 yo); CAD; CHF; CKD; HTN; BMI   I have spoken and communicated the following to the patient or parent/caregiver regarding COVID monoclonal antibody treatment:  1. FDA has authorized the emergency use for the treatment of mild to moderate COVID-19 in adults and pediatric patients with positive results of direct SARS-CoV-2 viral testing who are 17 years of age and older weighing at least 40 kg, and who are at high risk for progressing to severe COVID-19 and/or hospitalization.  2. The significant known and potential risks and benefits of COVID monoclonal antibody, and the extent to which such potential risks and benefits are unknown.  3. Information on available alternative treatments and the risks and benefits of those alternatives, including clinical trials.  4. Patients treated with COVID monoclonal antibody should continue to self-isolate and use infection control measures (e.g., wear mask, isolate, social distance, avoid sharing personal items, clean and disinfect "high touch" surfaces, and frequent handwashing) according to CDC guidelines.   5. The patient or parent/caregiver has the option to accept or refuse COVID monoclonal antibody treatment.  After reviewing this information with the patient, the patient has agreed to receive one of the  available covid 19 monoclonal antibodies and will be provided an appropriate fact sheet prior to infusion.  Jason Hodgkins, NP 05/24/2020 3:39 PM

## 2020-05-24 NOTE — Progress Notes (Signed)
  Diagnosis: COVID-19  Physician: Dr. Wright  Procedure: [facility]fusion [facility] Med: casirivimab\imdevimab infusion - Provided patient with casirivimab\imdevimab fact sheet for patients, parents and caregivers prior to infusion.  Complications: No immediate complications noted.  Discharge: Discharged home   Cambria Osten J Nahsir Venezia 05/24/2020  

## 2020-05-24 NOTE — Progress Notes (Signed)
I connected by phone with [name] on 05/24/2020 at 3:39 PM to discuss the potential use of a new treatment for mild to moderate COVID-19 viral infection in non-hospitalized patients.  This patient is a 74 y.o. male that meets the FDA criteria for Emergency Use Authorization of COVID monoclonal antibody casirivimab/imdevimab.  Has a (+) direct SARS-CoV-2 viral test result  Has mild or moderate COVID-19   Is NOT hospitalized due to COVID-19  Is within 10 days of symptom onset  Has at least one of the high risk factor(s) for progression to severe COVID-19 and/or hospitalization as defined in EUA.  Specific high risk criteria : Older age (>/= 75 yo); CAD; CHF; CKD; HTN; BMI   I have spoken and communicated the following to the patient or parent/caregiver regarding COVID monoclonal antibody treatment:  1. FDA has authorized the emergency use for the treatment of mild to moderate COVID-19 in adults and pediatric patients with positive results of direct SARS-CoV-2 viral testing who are 39 years of age and older weighing at least 40 kg, and who are at high risk for progressing to severe COVID-19 and/or hospitalization.  2. The significant known and potential risks and benefits of COVID monoclonal antibody, and the extent to which such potential risks and benefits are unknown.  3. Information on available alternative treatments and the risks and benefits of those alternatives, including clinical trials.  4. Patients treated with COVID monoclonal antibody should continue to self-isolate and use infection control measures (e.g., wear mask, isolate, social distance, avoid sharing personal items, clean and disinfect "high touch" surfaces, and frequent handwashing) according to CDC guidelines.   5. The patient or parent/caregiver has the option to accept or refuse COVID monoclonal antibody treatment.  After reviewing this information with the patient, the patient has agreed to receive one of the  available covid 19 monoclonal antibodies and will be provided an appropriate fact sheet prior to infusion.  Murray Hodgkins, NP 05/24/2020 3:39 PM

## 2020-06-04 ENCOUNTER — Encounter (HOSPITAL_COMMUNITY): Payer: Medicare Other | Admitting: Cardiology

## 2020-06-04 ENCOUNTER — Encounter: Payer: Self-pay | Admitting: Internal Medicine

## 2020-06-04 ENCOUNTER — Other Ambulatory Visit: Payer: Self-pay

## 2020-06-04 ENCOUNTER — Ambulatory Visit (INDEPENDENT_AMBULATORY_CARE_PROVIDER_SITE_OTHER): Payer: Medicare Other | Admitting: Internal Medicine

## 2020-06-04 VITALS — BP 138/82 | HR 57 | Ht 68.0 in | Wt 191.2 lb

## 2020-06-04 DIAGNOSIS — E1159 Type 2 diabetes mellitus with other circulatory complications: Secondary | ICD-10-CM

## 2020-06-04 DIAGNOSIS — E1165 Type 2 diabetes mellitus with hyperglycemia: Secondary | ICD-10-CM

## 2020-06-04 DIAGNOSIS — I208 Other forms of angina pectoris: Secondary | ICD-10-CM | POA: Diagnosis not present

## 2020-06-04 DIAGNOSIS — E782 Mixed hyperlipidemia: Secondary | ICD-10-CM

## 2020-06-04 DIAGNOSIS — E669 Obesity, unspecified: Secondary | ICD-10-CM | POA: Diagnosis not present

## 2020-06-04 NOTE — Progress Notes (Signed)
Patient ID: [name], male   DOB: Sep 10, 1945, 74 y.o.   MRN: [id number]   This visit occurred during the SARS-CoV-2 public health emergency.  Safety protocols were in place, including screening questions prior to the visit, additional usage of staff PPE, and extensive cleaning of exam room while observing appropriate contact time as indicated for disinfecting solutions.   HPI: [name] is a 74 y.o.-year-old male, initially referred by his nephrologist, Dr. 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 fib, CKD stage V, cerebrovascular disease, s/p TIA, ED).  Last visit 4 months ago.  He recently had COVID-19 infection (positive test 04/2020) and got antibody infusion 11 days ago.  He is feeling better.  He had a steroid inj 2 mo ago >> sugars were higher.  Reviewed HbA1c levels: Lab Results  Component Value Date   HGBA1C 5.9 (A) 01/29/2020   HGBA1C 7.1 (A) 09/25/2019   HGBA1C 7.1 (A) 05/23/2019   HGBA1C 8.5 (A) 02/02/2019   HGBA1C 7.2 (H) 03/23/2016   HGBA1C 6.5 (H) 08/20/2012  01/11/2019: HbA1c 8.3% 10/04/2018: HbA1c 8.2%  He is currently on: - Ozempic 0.5 mg weekly >> stopped 04/2020 - Glipizide 5 mg before a larger dinner >> before b'fast >> stopped at last OV (01/2020) >> restarted 04/2020 He came off metformin due to worsening kidney function. Stopped Glipizide 5 mg before b'fast and dinner when we started Ozempic, but restarted it 04/2019.  Pt checks his sugars once a day: - am: 161-185, 193 >> 85, 120-157, 170, 182 >> 110-130 >> 69, 93, 100-177, 182,  224 - 2h after b'fast: n/c >> 207 - before lunch: n/c - 2h after lunch: n/c >> 120-130 >> n/c >> 165 - before dinner: 129 >> n/c - 2h after dinner: n/c >> 190 >> n/c  - bedtime: n/c - nighttime: n/c Lowest sugar was 169 >> 161 >> 85 >> 104 >> 69; he has hypoglycemia awareness in the  70s. Highest sugar was 299 >> 193 >> 190 >> 160 >> 224.  Pt's meals are: - Breakfast: egg and bacon (not lately).   - Brunch: meat + veggies - Dinner: same as lunch - Snacks: crackers, popcorn   -He has stage V CKD-sees nephrology-did not have to start dialysis yet but has a fistula placed; last BUN/creatinine:  Lab Results  Component Value Date   BUN 36 (H) 03/07/2020   BUN 26 (H) 10/31/2019   CREATININE 3.66 (H) 03/07/2020   CREATININE 3.32 (H) 10/31/2019  01/11/2019: 45//3.79, GFR 16, glucose 149, ACR 52.5 10/04/2018: 32/3.78, GFR 15, glucose 184 He is not on ACE inhibitor/ARB.  -+ HL; last set of lipids: Lab Results  Component Value Date   CHOL 149 07/11/2019   HDL 38 (L) 07/11/2019   LDLCALC 73 07/11/2019   TRIG 188 (H) 07/11/2019   CHOLHDL 3.9 07/11/2019  He developed generalized aches and pains from pravastatin.  Also of Zetia.  He is currently on Praluent.  - last eye exam was in 11/2019: [facility]).   - no numbness and tingling in his feet.  Pt has FH of DM in sister and brother.  He has a history of lap band surgery 2010. Lost from 373 lbs to 215-220 lbs.  She also has a history of HTN.  ROS: Constitutional: no weight gain/+ weight loss, no fatigue, no subjective hyperthermia, no subjective hypothermia Eyes: no blurry vision, no  xerophthalmia ENT: no sore throat, no nodules palpated in neck, no dysphagia, no odynophagia, no hoarseness Cardiovascular: no CP/no SOB/no palpitations/no leg swelling Respiratory: no cough/no SOB/no wheezing Gastrointestinal: no N/no V/no D/no C/no acid reflux Musculoskeletal: no muscle aches/no joint aches Skin: no rashes, no hair loss Neurological: no tremors/no numbness/no tingling/no dizziness  I reviewed pt's medications, allergies, PMH, social hx, family hx, and changes were documented in the history of present illness. Otherwise, unchanged from my initial visit note.  Past Medical History:  Diagnosis Date  .  Allergic rhinitis   . Anxiety   . Atrial fibrillation ([place])   . Cancer ([place])    hx of skin cancers   . Chronic kidney disease   . Coronary artery disease    a. s/p MI & CABG; b. s/p PCI Diag;  c. Cath 12/2011: LAD diff dzs/small, Diag 75% isr, 90 dist to stent (small), LCX & RCA occluded, VG->OM 40, VG->PDA patent - med rx.  . Depression    PTSD  . Diabetes mellitus    not on medications  . Diastolic CHF, chronic (HCC)    a. EF 55-60% by echo 2007  . Dysrhythmia    "abnormal beat"  . GERD (gastroesophageal reflux disease)    "not anymore" " I had a lap band"  . History of diverticulitis of colon    5 YRS AGO  . History of pneumonia    2017  . History of transient ischemic attack (TIA)    CAROTID DOPPLERS NOV 2011  0-39& BIL. STENOSIS  . Hyperlipidemia   . Hypertension   . Mitral regurgitation   . Myocardial infarction ([place])   . Neuromuscular disorder ([place])    left arm numbness   . 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 PTSD (post-traumatic stress disorder)    from [place]  . 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 Shortness of breath dyspnea    with exertion  . Sleep apnea    cpap- see ov note in [facility] 05/12/11 for settings    Past Surgical History:  Procedure Laterality Date  . AV FISTULA PLACEMENT Left 08/02/2018   Procedure: ARTERIOVENOUS (AV) FISTULA CREATION RADIOCEPHALIC;  Surgeon: Angelia Mould, MD;  Location: [place];  Service: Vascular;  Laterality: Left;  . BLEPHAROPLASTY  1985   BILATERAL  . CARDIAC CATHETERIZATION  2001, 2004, 2009, 2011  . CARDIAC CATHETERIZATION N/A 01/23/2016   Procedure: Right/Left Heart Cath and Coronary Angiography;  Surgeon: [name], MD;  Location: [place] CV LAB;  Service:  Cardiovascular;  Laterality: N/A;  . CARDIOVERSION N/A 09/07/2017   Procedure: CARDIOVERSION;  Surgeon: [name], MD;  Location: [facility] ENDOSCOPY;  Service: Cardiovascular;  Laterality: N/A;  . CARDIOVERSION N/A 09/13/2019   Procedure: CARDIOVERSION;  Surgeon: [name], MD;  Location: [facility] ENDOSCOPY;  Service: Cardiovascular;  Laterality: N/A;  . CARDIOVERSION N/A 10/19/2019   Procedure: CARDIOVERSION;  Surgeon: [name], MD;  Location: [facility] ENDOSCOPY;  Service: Cardiovascular;  Laterality: N/A;  . CATARACT EXTRACTION W/ INTRAOCULAR LENS IMPLANT Bilateral   . CHONDROPLASTY  07/22/2011   Procedure: CHONDROPLASTY;  Surgeon: Dione Plover Aluisio;  Location: [place];  Service: Orthopedics;;  . CIRCUMCISION  35 YRS  AGO  . COLONOSCOPY    .  CORONARY ANGIOPLASTY  1996-- POST CABG   W/ STENT, last cath 01/07/2012   . CORONARY ARTERY BYPASS GRAFT  1995   X3 VESSEL  . CORONARY STENT PLACEMENT    . KNEE ARTHROSCOPY  07/22/2011   Procedure: ARTHROSCOPY KNEE;  Surgeon: [name];  Location: [place];  Service: Orthopedics;  Laterality: Right;  WITH DEBRIDEMENT   . KNEE ARTHROSCOPY W/ MENISCECTOMY  X2 IN 2002-- RIGHT KNEE  . LAPAROSCOPIC GASTRIC BANDING  01-15-09  . LEFT HEART CATH AND CORONARY ANGIOGRAPHY N/A 04/29/2017   Procedure: LEFT HEART CATH AND CORONARY ANGIOGRAPHY;  Surgeon: [name], MD;  Location: [place] CV LAB;  Service: Cardiovascular;  Laterality: N/A;  . LEFT HEART CATHETERIZATION WITH CORONARY ANGIOGRAM N/A 09/11/2013   Procedure: LEFT HEART CATHETERIZATION WITH CORONARY ANGIOGRAM;  Surgeon: [name], MD;  Location: [facility] CATH LAB;  Service: Cardiovascular;  Laterality: N/A;  . MITRAL VALVE REPAIR Right 03/25/2016   Procedure: MINIMALLY INVASIVE REOPERATION FOR MITRAL VALVE REPAIR  (MVR) with size 26 Sorin Memo 3D;  Surgeon: [name], MD;  Location: [place];  Service: Open Heart Surgery;  Laterality: Right;  . RIGHT  KNEE ED COMPARTMENT REPLACEMENT  2004  . SYNOVECTOMY  07/22/2011   Procedure: SYNOVECTOMY;  Surgeon: [name];  Location: [place];  Service: Orthopedics;;  . TEE WITHOUT CARDIOVERSION N/A 01/23/2016   Procedure: TRANSESOPHAGEAL ECHOCARDIOGRAM (TEE);  Surgeon: [name], MD;  Location: [place];  Service: Cardiovascular;  Laterality: N/A;  . TEE WITHOUT CARDIOVERSION N/A 03/25/2016   Procedure: TRANSESOPHAGEAL ECHOCARDIOGRAM (TEE);  Surgeon: [name], MD;  Location: [place];  Service: Open Heart Surgery;  Laterality: N/A;  . UMBILICAL HERNIA REPAIR  01-15-09   W/ GASTRIC BANDING PROCEDURE   Social History   Socioeconomic History  . Marital status: Married    Spouse name: Not on file  . Number of children: 1  . Years of education: Not on file  . Highest education level: Not on file  Occupational History  . Contractor, semi-retired  Social Needs  . Financial resource strain: Not on file  . Food insecurity:    Worry: Not on file    Inability: Not on file  . Transportation needs:    Medical: Not on file    Non-medical: Not on file  Tobacco Use  . Smoking status: Never Smoker  . Smokeless tobacco: Never Used  Substance and Sexual Activity  . Alcohol use: Yes, liquor    Comment: OCCASIONAL  . Drug use: No  . Sexual activity: Not on file  Lifestyle  . Physical activity:    Days per week: Not on file    Minutes per session: Not on file  . Stress: Not on file  Relationships  . Social connections:    Talks on phone: Not on file    Gets together: Not on file    Attends religious service: Not on file    Active member of club or organization: Not on file    Attends meetings of clubs or organizations: Not on file    Relationship status: Not on file  . Intimate partner violence:    Fear of current or ex partner: Not on file    Emotionally abused: Not on file    Physically abused: Not on file    Forced sexual activity: Not on file  Other  Topics Concern  . Not on file  Social History Narrative  . Not on  file   Current Outpatient Medications on File Prior to Visit  Medication Sig Dispense Refill  . Alirocumab (PRALUENT) 75 MG/ML SOAJ Inject 75 mg into the skin every 14 (fourteen) days.    Marland Kitchen allopurinol (ZYLOPRIM) 100 MG tablet Take 100 mg by mouth in the morning and at bedtime.     Marland Kitchen amiodarone (PACERONE) 200 MG tablet Take 1 tablet (200 mg total) by mouth daily. 60 tablet 0  . amoxicillin (AMOXIL) 500 MG tablet Take 4 tablets (2 g) by mouth 30-60 minutes prior to dental appointment (Patient not taking: Reported on 01/29/2020) 12 tablet 0  . apixaban (ELIQUIS) 5 MG TABS tablet Take 1 tablet (5 mg total) by mouth 2 (two) times daily. 180 tablet 3  . Artificial Saliva (MOUTH KOTE MT) Use as directed 4 sprays in the mouth or throat every 3 (three) hours as needed (for dry mouth).    . calcitRIOL (ROCALTROL) 0.25 MCG capsule Take 0.25 mcg by mouth at bedtime.     . cetirizine (ZYRTEC) 10 MG tablet Take 10 mg by mouth daily.     . colchicine 0.6 MG tablet Take 0.6-1.2 mg by mouth 2 (two) times daily as needed (gout).     . furosemide (LASIX) 40 MG tablet Take 40 mg by mouth daily.    Marland Kitchen glipiZIDE (GLUCOTROL) 5 MG tablet TAKE 1 TABLET TWICE A DAY BEFORE MEALS 180 tablet 1  . HYDROcodone-acetaminophen (NORCO/VICODIN) 5-325 MG tablet Take 0.5 tablets by mouth as needed for moderate pain.     . Hypromellose (ARTIFICIAL TEARS OP) Apply 1 drop to eye 4 (four) times daily as needed (dry eyes).     . nitroGLYCERIN (NITROSTAT) 0.4 MG SL tablet 1 TAB UNDER THE TONGUE EVERY 5 MINUTES AS NEEDED FOR CHEST PAIN UP TO 3 DOSES, IF PERSIST CALL 911 75 tablet 1  . pantoprazole (PROTONIX) 40 MG tablet Take 40 mg by mouth daily as needed (acid reflux/indigestion.).     Marland Kitchen potassium chloride SA (K-DUR,KLOR-CON) 20 MEQ tablet Take 20 mEq by mouth daily.    . Semaglutide,0.25 or 0.5MG /DOS, (OZEMPIC, 0.25 OR 0.5 MG/DOSE,) 2 MG/1.5ML SOPN Inject 0.375 mLs (0.5  mg total) into the skin every Thursday. 3 pen 3  . sildenafil (VIAGRA) 50 MG tablet Take 1 tablet (50 mg total) by mouth daily as needed for erectile dysfunction. Do not take nitroglycerin with this medication 10 tablet 3  . tadalafil (CIALIS) 20 MG tablet TAKE 1 TABLET BY MOUTH ONCE DAILY AS NEEDED 10 tablet 0  . testosterone cypionate (DEPOTESTOSTERONE CYPIONATE) 200 MG/ML injection Inject 400 mg into the muscle once a week.     . traZODone (DESYREL) 100 MG tablet Take 100 mg by mouth at bedtime.     No current facility-administered medications on file prior to visit.   Allergies  Allergen Reactions  . Crestor [Rosuvastatin] Other (See Comments)    Muscle aches   . Lipitor [Atorvastatin] Other (See Comments)    Muscle aches  . Pravastatin Other (See Comments)    Muscle aches  . Codeine Itching and Other (See Comments)    Extremity tingling-- can take synthetic   Family History  Problem Relation Age of Onset  . Other Mother        joint problems  . Hypertension Father   . Heart disease Father   . CVA Father   . Depression Father   . Heart disease Sister        CABG  . Stroke Sister   .  Hypertension Sister   . Congestive Heart Failure Sister   . Diabetes Sister   . Heart disease Brother   . Hypertension Brother   . Congestive Heart Failure Brother     PE: BP 138/82   Pulse (!) 57   Ht 5\' 8"  (1.727 m)   Wt 191 lb 3.2 oz (86.7 kg)   BMI 29.07 kg/m  Wt Readings from Last 3 Encounters:  06/04/20 191 lb 3.2 oz (86.7 kg)  03/07/20 191 lb (86.6 kg)  01/29/20 203 lb (92.1 kg)   Constitutional: overweight, in NAD Eyes: PERRLA, EOMI, no exophthalmos ENT: moist mucous membranes, no thyromegaly, no cervical lymphadenopathy Cardiovascular: RRR, No MRG Respiratory: CTA B Gastrointestinal: abdomen soft, NT, ND, BS+ Musculoskeletal: no deformities, strength intact in all 4 Skin: moist, warm, no rashes Neurological: no tremor with outstretched hands, DTR normal in all  4  ASSESSMENT: 1. DM2, non-insulin-dependent, uncontrolled, with long-term complications - CAD, s/p coronaroplasty, then CABG 1995 - cardiologist - Dr. Aundra Dubin - CHF, EF 38 to 65% - Atrial fibrillation, cardioversion in 2019 - Carotid artery disease - Cerebrovascular disease, s/p TIA - CKD stage V, pending dialysis -Dr. Jimmy Footman - ED  2. HL  3.  Obesity class I  PLAN:  1. Patient with longstanding, previously uncontrolled, type 2 diabetes, with improved control recently.  He came off Metformin due to worsening kidney function.  Also, we did not use an SGLT2 inhibitor due to his poor kidney function.  He had a hard time obtaining Ozempic but he was then able to restart his.  At last visit, sugars were excellent and his HbA1c was 5.9%, improved.  At that time we stopped glipizide.  He was only checking blood sugars in the morning and I strongly advised him to also check later in the day. -At this visit, he tells me that he stopped Ozempic due to diarrhea that developed last month.  He switch back to glipizide.  However, retrospectively, he was diagnosed with Covid soon afterwards and I believe that his diarrhea is related to his viral infection, since he has been taking Ozempic for a year up until last month, without side effects.  Therefore, we discussed about switching back to Ozempic and stopping glipizide again and see how he feels on this regimen now that his Covid infection is resolved.  He agrees with the plan. - I suggested to:  Patient Instructions  Please stop Glipizide and restart: - Ozempic 0.25 mg weekly x 2 weeks then increase to 0.5 mg weekly  Please return in 4 months with your sugar log.   - we checked his HbA1c: 6.6% (higher) - advised to check sugars at different times of the day - 1x a day, rotating check times - advised for yearly eye exams >> he is UTD - return to clinic in 4 months   2. HL -Reviewed latest lipid panel available to me, from 06/2019: Triglycerides  high, LDL close to goal, HDL slightly low: Lab Results  Component Value Date   CHOL 149 07/11/2019   HDL 38 (L) 07/11/2019   LDLCALC 73 07/11/2019   TRIG 188 (H) 07/11/2019   CHOLHDL 3.9 07/11/2019  -Apparently, [facility] not [facility] more recently -he will bring the results when he comes at next visit -Continues Praluent without side effects.  He had side effects from statins in the past  3.  Obesity class I -We will restart Ozempic which should also help with  weight loss -He gained 5 pounds before each of the previous 2 visits, now lost 12 lbs since last OV  Philemon Kingdom, MD PhD [facility]

## 2020-06-04 NOTE — Patient Instructions (Addendum)
Please stop Glipizide and restart: - Ozempic 0.25 mg weekly x 2 weeks then increase to 0.5 mg weekly  Please return in 4 months with your sugar log.

## 2020-06-06 ENCOUNTER — Ambulatory Visit (HOSPITAL_COMMUNITY)
Admission: RE | Admit: 2020-06-06 | Discharge: 2020-06-06 | Disposition: A | Discharge status: Home / Self Care | Payer: Medicare Other | Source: Ambulatory Visit | Attending: Cardiology | Admitting: Cardiology

## 2020-06-06 ENCOUNTER — Other Ambulatory Visit: Payer: Self-pay

## 2020-06-06 VITALS — BP 115/70 | HR 53 | Wt 194.2 lb

## 2020-06-06 DIAGNOSIS — Z8616 Personal history of COVID-19: Secondary | ICD-10-CM | POA: Insufficient documentation

## 2020-06-06 DIAGNOSIS — Z7984 Long term (current) use of oral hypoglycemic drugs: Secondary | ICD-10-CM | POA: Diagnosis not present

## 2020-06-06 DIAGNOSIS — N184 Chronic kidney disease, stage 4 (severe): Secondary | ICD-10-CM | POA: Insufficient documentation

## 2020-06-06 DIAGNOSIS — I340 Nonrheumatic mitral (valve) insufficiency: Secondary | ICD-10-CM | POA: Insufficient documentation

## 2020-06-06 DIAGNOSIS — Z8673 Personal history of transient ischemic attack (TIA), and cerebral infarction without residual deficits: Secondary | ICD-10-CM | POA: Diagnosis not present

## 2020-06-06 DIAGNOSIS — I130 Hypertensive heart and chronic kidney disease with heart failure and stage 1 through stage 4 chronic kidney disease, or unspecified chronic kidney disease: Secondary | ICD-10-CM | POA: Diagnosis not present

## 2020-06-06 DIAGNOSIS — I4892 Unspecified atrial flutter: Secondary | ICD-10-CM | POA: Insufficient documentation

## 2020-06-06 DIAGNOSIS — I770 Arteriovenous fistula, acquired: Secondary | ICD-10-CM | POA: Diagnosis not present

## 2020-06-06 DIAGNOSIS — G4733 Obstructive sleep apnea (adult) (pediatric): Secondary | ICD-10-CM | POA: Insufficient documentation

## 2020-06-06 DIAGNOSIS — I252 Old myocardial infarction: Secondary | ICD-10-CM | POA: Diagnosis not present

## 2020-06-06 DIAGNOSIS — E1122 Type 2 diabetes mellitus with diabetic chronic kidney disease: Secondary | ICD-10-CM | POA: Diagnosis not present

## 2020-06-06 DIAGNOSIS — Z7901 Long term (current) use of anticoagulants: Secondary | ICD-10-CM | POA: Insufficient documentation

## 2020-06-06 DIAGNOSIS — I2510 Atherosclerotic heart disease of native coronary artery without angina pectoris: Secondary | ICD-10-CM | POA: Diagnosis not present

## 2020-06-06 DIAGNOSIS — M109 Gout, unspecified: Secondary | ICD-10-CM | POA: Diagnosis not present

## 2020-06-06 DIAGNOSIS — I5032 Chronic diastolic (congestive) heart failure: Secondary | ICD-10-CM | POA: Diagnosis not present

## 2020-06-06 DIAGNOSIS — Z8249 Family history of ischemic heart disease and other diseases of the circulatory system: Secondary | ICD-10-CM | POA: Insufficient documentation

## 2020-06-06 DIAGNOSIS — Z6829 Body mass index (BMI) 29.0-29.9, adult: Secondary | ICD-10-CM | POA: Insufficient documentation

## 2020-06-06 DIAGNOSIS — I480 Paroxysmal atrial fibrillation: Secondary | ICD-10-CM | POA: Diagnosis not present

## 2020-06-06 DIAGNOSIS — N179 Acute kidney failure, unspecified: Secondary | ICD-10-CM | POA: Insufficient documentation

## 2020-06-06 DIAGNOSIS — Z79899 Other long term (current) drug therapy: Secondary | ICD-10-CM | POA: Insufficient documentation

## 2020-06-06 DIAGNOSIS — E669 Obesity, unspecified: Secondary | ICD-10-CM | POA: Diagnosis not present

## 2020-06-06 DIAGNOSIS — Z951 Presence of aortocoronary bypass graft: Secondary | ICD-10-CM | POA: Insufficient documentation

## 2020-06-06 DIAGNOSIS — E785 Hyperlipidemia, unspecified: Secondary | ICD-10-CM | POA: Diagnosis not present

## 2020-06-06 DIAGNOSIS — E7800 Pure hypercholesterolemia, unspecified: Secondary | ICD-10-CM | POA: Diagnosis not present

## 2020-06-06 LAB — COMPREHENSIVE METABOLIC PANEL
ALT: 24 U/L (ref 0–44)
AST: 22 U/L (ref 15–41)
Albumin: 3.6 g/dL (ref 3.5–5.0)
Alkaline Phosphatase: 92 U/L (ref 38–126)
Anion gap: 11 (ref 5–15)
BUN: 41 mg/dL — ABNORMAL HIGH (ref 8–23)
CO2: 28 mmol/L (ref 22–32)
Calcium: 10.2 mg/dL (ref 8.9–10.3)
Chloride: 98 mmol/L (ref 98–111)
Creatinine, Ser: 2.81 mg/dL — ABNORMAL HIGH (ref 0.61–1.24)
GFR, Estimated: 21 mL/min — ABNORMAL LOW (ref >60)
Glucose, Bld: 108 mg/dL — ABNORMAL HIGH (ref 70–99)
Potassium: 4.1 mmol/L (ref 3.5–5.1)
Sodium: 137 mmol/L (ref 135–145)
Total Bilirubin: 0.9 mg/dL (ref 0.3–1.2)
Total Protein: 6.2 g/dL — ABNORMAL LOW (ref 6.5–8.1)

## 2020-06-06 LAB — CBC
HCT: 39.4 % (ref 39.0–52.0)
Hemoglobin: 12.7 g/dL — ABNORMAL LOW (ref 13.0–17.0)
MCH: 32.1 pg (ref 26.0–34.0)
MCHC: 32.2 g/dL (ref 30.0–36.0)
MCV: 99.5 fL (ref 80.0–100.0)
Platelets: 208 10*3/uL (ref 150–400)
RBC: 3.96 MIL/uL — ABNORMAL LOW (ref 4.22–5.81)
RDW: 13.9 % (ref 11.5–15.5)
WBC: 7.8 10*3/uL (ref 4.0–10.5)
nRBC: 0 % (ref 0.0–0.2)

## 2020-06-06 LAB — LIPID PANEL
Cholesterol: 168 mg/dL (ref 0–200)
HDL: 54 mg/dL (ref >40)
LDL Cholesterol: 81 mg/dL (ref 0–99)
Total CHOL/HDL Ratio: 3.1 RATIO
Triglycerides: 167 mg/dL — ABNORMAL HIGH (ref <150)
VLDL: 33 mg/dL (ref 0–40)

## 2020-06-06 LAB — TSH: TSH: 2.432 u[IU]/mL (ref 0.350–4.500)

## 2020-06-06 NOTE — Progress Notes (Signed)
Patient ID: [name], male   DOB: May 06, 1946, 74 y.o.   MRN: [id number] PCP: Dr Harrington Challenger Cardiology: Dr. Aundra Dubin  74 y.o. with history of CAD s/p CABG and obesity s/p lap banding procedure presents for cardiology followup.  Given exertional dyspnea and chest tightness, he had LHC in 1/15.  This showed no change from prior.  Grafts were patent and there was severe stenosis in distal D1, not amenable to PCI.   In 2017, he developed increased exertional dyspnea.  Eventually had a TEE showing severe MR with prolapse of a portion of the anterior mitral valve leaflet.  Coronary angiography in 6/17 showed patent grafts and 90% stenosis of distal diagonal not amenable to intervention.  In 8/17, he had mitral valve repair.  Post-op diastolic CHF, Lasix started and increased.   He had a cath again in 9/18 showing stable coronary anatomy.    He developed atrial arrhythmias in 12/18.  Both fibrillation and flutter were seen.  Saw Dr. Rayann Heman, recommended DCCV with amiodarone or sotalol if fibrillation recurs.  He had DCCV in 1/19.   In 6/19, he was noted to have developed AKI. Creatinine went up to 3.46.  Renal US was done, showing normal kidneys. Cause of AKI was not clear.  We stopped his Lasix. He saw nephrology and eventually went back on Lasix, currently taking 80 mg daily.  He is not using any NSAIDs.    He stopped pravastatin due to myalgias. We struggled to get him on a PCSK9 inhibitor through the [place] but he was finally able to get Praluent.    I had him do a Lexiscan Cardiolite in 11/19, which showed old inferior MI with no ischemia.  Echo in 11/19 showed EF 55-60%, s/p MV repair with mean gradient 6 mmHg. Echo 12/20 showed EF 50-55%, mildly decreased RV systolic function, repaired mitral valve with mild MR, mean gradient 6 mmHg across MV.    He had DCCV from atrial fibrillation to NSR in 1/21 and again in 2/21.   At last appointment, he reported significant fatigue and dyspnea in setting of weight  loss with low BP and HR.  I stopped his Coreg. He felt better with that change.    In 9/21, he had COVID-19 infection but was not hospitalized.   Today he returns for HF follow up.  He feels like he is doing well.  Thinks he has completely recovered from COVID-19 infection.  He has been walking for exercise, went 1.3 miles yesterday without dyspnea.  No chest pain.  No palpitations.  No BRBPR/melena.  Creatinine has remained stable recently.   ECG (personally reviewed): NSR, LAFB, poor RWP  Labs (11/12): LDL 70 Labs (5/13): K 4.1, creatinine 1.1, BNP 48 Labs (12/13): K 3.4, creatinine 1.07, LDL 59, HDL 37 Labs (1/15): K 4.3, creatinine 1.2, HCT 40.3 Labs (3/15): LDL 38, HDL 81, BNP 55, K 4, creatinine 1.2 Labs (6/15): K 4.2, creatinine 1.4, LFTs normal, LDL 66, HDL 35 Labs (8/16): K 4, creatinine 1.6, BNP 64 Labs (12/16): LDL 61, HDL 37 Labs (4/17): K 4.2, creatinine 1.35, HCT 38.7, BNP 80 Labs (8/17): K 4.2, creatinine 1.4 => 1.66, BNP 159 Labs (12/17): K 3.6, creatinine 1.75 Labs (2/18): LDL 76, HDL 36 Labs (9/18): K 3.9, creatinine 1.76 Labs (1/19): K 4.9, creatinine 1.76, hgb 14 Labs (6/19): K 3.6, creatinine 3.46 Labs (7/19): K 4.1, creatinine 3.66, hgb 12.7 Labs (10/19): LDL 177, hgb 13.4, troponin negative, K 4, creatinine 4.15 Labs (12/19): K 4.3,  creatinine 3.88 Labs (4/20): K 3.6, creatinine 3.39 Labs (11/20): LDL 73, HDL 38 Labs (2/21): K 3.4, creatinine 3.8 Labs (7/21): K 3.7, creatinine 3.66  PMH: 1. OSA: on CPAP.  2. Diabetes mellitus 3. Allergic rhinitis 4. CAD: s/p CABG 1995.  LHC (10/11) with 90% distal D1 (patent proximal D1 stent), total occlusion CFX, total occlusion RCA, no significant stenoses in LAD, SVG-PDA patent, SVG-OM patent, EF 55%.  Medical management.  LHC (5/13) with patent grafts and 90% distal D1 stenosis (no significant change from 10/11).  LHC (1/15) with patent grafts, 75% ISR in D1 stent, 90% stenosis distal D1 (small caliber). D1 not amenable  to intervention.  - Coronary angiography (6/17): grafts patent, 90% stenosis distally in large diagonal not amenable to intervention.   - LHC (9/18): SVG-OM and SVG-PDA patent,  75% in-stent restenosis in proximal diagonal then 90% stenosis distally (small in caliber at that point), totally occluded LCx and RCA.  Left coronary system similar to prior caths, D1 may be source of angina.  - Lexiscan Cardiolite (11/19): EF 47%, old inferior MI, no ischemia.  5. Obesity: lap-banding in 5/11.  6. Chronic diastolic CHF: Echo (7/35) with EF 55-60%, moderate LVH, moderate diastolic dysfunction, s/p mitral valve repair with elevated mean gradient but normal pressure half-time.  - Echo (8/18): EF 55-60%, s/p MV repair with mean gradient 6 mmHg, PASP 28 mmHg, mildly decreased RV systolic function.  - Echo (11/19): EF 55-60%, s/p MV repair with mean gradient 6, PASP 42 mmHg, mildly dilated RV, dilated IVC.  - Echo (12/20): EF 50-55%, mildly decreased RV systolic function. S/p MV repair with mean gradient 6 mmHg, mild MR.  7. TIA: Carotid dopplers in 11/11 with 0-39% bilateral stenosis.  Carotid dopplers 6/17 with 1-39% RICA stenosis.  8. OA: Knees, C-spine. TKR 9/13.  9. HTN 10. Hyperlipidemia: Myalgias with atorvastatin and Crestor. Now on Praluent.  11. Diverticulosis 12. MItral regurgitation: TEE (6/17) with EF 55-60%, severe eccentric MR, prolapse of a portion of the anterior leaflet, peak RV-RA gradient 45 mmHg.  - Mitral valve repair 8/17 13. CKD: Stage III.  14. Atrial fibrillation/atrial flutter: Both arrhythmias noted in 12/18.  DCCV 12/19.   - DCCV to NSR 1/21.  - DCCV to NSR 2/21.  15. AKI 6/19 => CKD stage 4: uncertain etiology.  Renal US was normal.  16. Gout 17. 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  Lives in [place].   Family History  Problem Relation Age of Onset  . Other Mother        joint problems  . Hypertension Father   . Heart  disease Father   . CVA Father   . Depression Father   . Heart disease Sister        CABG  . Stroke Sister   . Hypertension Sister   . Congestive Heart Failure Sister   . Diabetes Sister   . Heart disease Brother   . Hypertension Brother   . Congestive Heart Failure Brother     ROS: All systems reviewed and negative except as per HPI.    Current Outpatient Medications  Medication Sig Dispense Refill  . Alirocumab (PRALUENT) 75 MG/ML SOAJ Inject 75 mg into the skin every 14 (fourteen) days.    Marland Kitchen allopurinol (ZYLOPRIM) 100 MG tablet Take 100 mg by mouth in the morning and at bedtime.     Marland Kitchen amiodarone (PACERONE) 200 MG tablet Take 1 tablet (200 mg total) by  mouth daily. 60 tablet 0  . apixaban (ELIQUIS) 5 MG TABS tablet Take 1 tablet (5 mg total) by mouth 2 (two) times daily. 180 tablet 3  . Artificial Saliva (MOUTH KOTE MT) Use as directed 4 sprays in the mouth or throat every 3 (three) hours as needed (for dry mouth).    . calcitRIOL (ROCALTROL) 0.25 MCG capsule Take 0.25 mcg by mouth at bedtime.     . cetirizine (ZYRTEC) 10 MG tablet Take 10 mg by mouth daily.     . colchicine 0.6 MG tablet Take 0.6-1.2 mg by mouth 2 (two) times daily as needed (gout).     . furosemide (LASIX) 40 MG tablet Take 40 mg by mouth daily.    Marland Kitchen glipiZIDE (GLUCOTROL) 5 MG tablet TAKE 1 TABLET TWICE A DAY BEFORE MEALS 180 tablet 1  . nitroGLYCERIN (NITROSTAT) 0.4 MG SL tablet 1 TAB UNDER THE TONGUE EVERY 5 MINUTES AS NEEDED FOR CHEST PAIN UP TO 3 DOSES, IF PERSIST CALL 911 75 tablet 1  . potassium chloride SA (K-DUR,KLOR-CON) 20 MEQ tablet Take 20 mEq by mouth daily.    . Semaglutide,0.25 or 0.5MG /DOS, (OZEMPIC, 0.25 OR 0.5 MG/DOSE,) 2 MG/1.5ML SOPN Inject 0.375 mLs (0.5 mg total) into the skin every Thursday. 3 pen 3  . tadalafil (CIALIS) 20 MG tablet TAKE 1 TABLET BY MOUTH ONCE DAILY AS NEEDED 10 tablet 0  . traZODone (DESYREL) 100 MG tablet Take 100 mg by mouth at bedtime.     No current  facility-administered medications for this encounter.    BP 115/70   Pulse (!) 53   Wt 88.1 kg (194 lb 3.2 oz)   SpO2 98%   BMI 29.53 kg/m    Wt Readings from Last 3 Encounters:  06/06/20 88.1 kg (194 lb 3.2 oz)  06/04/20 86.7 kg (191 lb 3.2 oz)  03/07/20 86.6 kg (191 lb)   General: NAD Neck: No JVD, no thyromegaly or thyroid nodule.  Lungs: Clear to auscultation bilaterally with normal respiratory effort. CV: Nondisplaced PMI.  Heart regular S1/S2, no S3/S4, no murmur.  No peripheral edema.  No carotid bruit.  Normal pedal pulses.  Abdomen: Soft, nontender, no hepatosplenomegaly, no distention.  Skin: Intact without lesions or rashes.  Neurologic: Alert and oriented x 3.  Psych: Normal affect. Extremities: No clubbing or cyanosis.  HEENT: Normal.   Assessment/Plan: 1. S/p mitral valve repair:  Echo 12/20 showed stable mitral valve repair compared to prior echo with mild MR, mean gradient 6 mmHg.   - With prosthetic material, should use antibiotic prophylaxis with dental work.  2. CAD:  He has severe stenosis in a distal D1 that is not amenable to intervention.  This has been stable on last couple of caths. He has occluded RCA and LCx known from the past with SVGs to OM and PDA.  EF remains preserved by echo. Cardiolite in 11/19 showed no ischemia. No chest pain.  - No ASA with apixaban use.  - He cannot tolerate statins with myalgias, Now on Praluent.    3. Hyperlipidemia: Myalgias with Crestor, pravastatin, and atorvastatin.  We were finally able to get him Praluent through the [place].  - Check lipids today.  4. Chronic diastolic CHF:  NYHA class II symptoms, he is not volume overloaded on exam.   - Continue Lasix 40 mg daily, BMET today.  5. OSA: Continue CPAP.  6. Atrial fibrillation: Paroxysmal, in NSR after DCCV in 2/21.  He is now on amiodarone to maintain NSR given  rapid recurrence of AF after DCCV in 1/21.  Probably not a good ablation candidate with size of atria.   -  Continue apixaban 5 mg bid.  - Continue amiodarone 200 mg daily. Check LFTs, TSH today.  He will need a regular eye exam.  7. CKD stage 4: AKI without recovery, uncertain etiology.  He sees nephrology, there is concern that he is progressing towards HD. He now has AV fistula. Check BMET  8. Diabetes: He is now followed by endocrinology.  Followup in 6 months with me.   [name] 06/06/2020

## 2020-06-06 NOTE — Patient Instructions (Signed)
Labs done today. We will contact you only if your labs are abnormal.  No medication changes were made. Please continue all current medications as prescribed.  Your physician recommends that you schedule a follow-up appointment in: 6 months. Our office will contact you to schedule an appointment.  If you have any questions or concerns before your next appointment please send [place] a message through [place] or call our office at [phone].    TO LEAVE A MESSAGE FOR THE NURSE SELECT OPTION 2, PLEASE LEAVE A MESSAGE INCLUDING: . YOUR NAME . DATE OF BIRTH . CALL BACK NUMBER . REASON FOR CALL**this is important as we prioritize the call backs  [place] AS LONG AS YOU CALL BEFORE 4:00 PM   At the [facility], you and your health needs are our priority. As part of our continuing mission to provide you with exceptional heart care, we have created designated Provider Care Teams. These Care Teams include your primary Cardiologist (physician) and Advanced Practice Providers (APPs- Physician Assistants and Nurse Practitioners) who all work together to provide you with the care you need, when you need it.   You may see any of the following providers on your designated Care Team at your next follow up: Marland Kitchen Dr Glori Bickers . Dr [name] . Darrick Grinder, NP . Lyda Jester, PA . Audry Riles, PharmD   Please be sure to bring in all your medications bottles to every appointment.

## 2020-06-07 ENCOUNTER — Telehealth (HOSPITAL_COMMUNITY): Payer: Self-pay

## 2020-06-07 NOTE — Telephone Encounter (Signed)
Malena Edman, RN  06/07/2020 2:21 PM EDT Back to Top    Patient advised and verbalized understanding

## 2020-06-07 NOTE — Telephone Encounter (Signed)
-----   Message from [name], MD sent at 06/07/2020 10:49 AM EDT ----- Creatinine improved.  Make sure he is taking Praluent regularly.

## 2020-06-18 DIAGNOSIS — U099 Post covid-19 condition, unspecified: Secondary | ICD-10-CM | POA: Diagnosis not present

## 2020-06-18 DIAGNOSIS — D6869 Other thrombophilia: Secondary | ICD-10-CM | POA: Diagnosis not present

## 2020-06-18 DIAGNOSIS — I2510 Atherosclerotic heart disease of native coronary artery without angina pectoris: Secondary | ICD-10-CM | POA: Diagnosis not present

## 2020-06-18 DIAGNOSIS — R053 Chronic cough: Secondary | ICD-10-CM | POA: Diagnosis not present

## 2020-06-18 DIAGNOSIS — R0600 Dyspnea, unspecified: Secondary | ICD-10-CM | POA: Diagnosis not present

## 2020-06-18 DIAGNOSIS — R413 Other amnesia: Secondary | ICD-10-CM | POA: Diagnosis not present

## 2020-06-26 NOTE — Telephone Encounter (Signed)
Open in error

## 2020-07-11 DIAGNOSIS — I4891 Unspecified atrial fibrillation: Secondary | ICD-10-CM | POA: Diagnosis not present

## 2020-07-11 DIAGNOSIS — I120 Hypertensive chronic kidney disease with stage 5 chronic kidney disease or end stage renal disease: Secondary | ICD-10-CM | POA: Diagnosis not present

## 2020-07-11 DIAGNOSIS — M109 Gout, unspecified: Secondary | ICD-10-CM | POA: Diagnosis not present

## 2020-07-11 DIAGNOSIS — N185 Chronic kidney disease, stage 5: Secondary | ICD-10-CM | POA: Diagnosis not present

## 2020-07-11 DIAGNOSIS — N2581 Secondary hyperparathyroidism of renal origin: Secondary | ICD-10-CM | POA: Diagnosis not present

## 2020-07-11 DIAGNOSIS — E1122 Type 2 diabetes mellitus with diabetic chronic kidney disease: Secondary | ICD-10-CM | POA: Diagnosis not present

## 2020-07-11 DIAGNOSIS — I509 Heart failure, unspecified: Secondary | ICD-10-CM | POA: Diagnosis not present

## 2020-07-11 DIAGNOSIS — D631 Anemia in chronic kidney disease: Secondary | ICD-10-CM | POA: Diagnosis not present

## 2020-08-20 DIAGNOSIS — R053 Chronic cough: Secondary | ICD-10-CM | POA: Diagnosis not present

## 2020-08-20 DIAGNOSIS — U099 Post covid-19 condition, unspecified: Secondary | ICD-10-CM | POA: Diagnosis not present

## 2020-08-20 DIAGNOSIS — D6869 Other thrombophilia: Secondary | ICD-10-CM | POA: Diagnosis not present

## 2020-08-20 DIAGNOSIS — I2510 Atherosclerotic heart disease of native coronary artery without angina pectoris: Secondary | ICD-10-CM | POA: Diagnosis not present

## 2020-08-20 DIAGNOSIS — R413 Other amnesia: Secondary | ICD-10-CM | POA: Diagnosis not present

## 2020-08-20 DIAGNOSIS — R0600 Dyspnea, unspecified: Secondary | ICD-10-CM | POA: Diagnosis not present

## 2020-09-23 DIAGNOSIS — E291 Testicular hypofunction: Secondary | ICD-10-CM | POA: Diagnosis not present

## 2020-09-23 DIAGNOSIS — Z0000 Encounter for general adult medical examination without abnormal findings: Secondary | ICD-10-CM | POA: Diagnosis not present

## 2020-09-23 DIAGNOSIS — N184 Chronic kidney disease, stage 4 (severe): Secondary | ICD-10-CM | POA: Diagnosis not present

## 2020-09-23 DIAGNOSIS — Z9884 Bariatric surgery status: Secondary | ICD-10-CM | POA: Diagnosis not present

## 2020-09-23 DIAGNOSIS — E559 Vitamin D deficiency, unspecified: Secondary | ICD-10-CM | POA: Diagnosis not present

## 2020-09-23 DIAGNOSIS — I4891 Unspecified atrial fibrillation: Secondary | ICD-10-CM | POA: Diagnosis not present

## 2020-09-23 DIAGNOSIS — I509 Heart failure, unspecified: Secondary | ICD-10-CM | POA: Diagnosis not present

## 2020-09-23 DIAGNOSIS — Z79899 Other long term (current) drug therapy: Secondary | ICD-10-CM | POA: Diagnosis not present

## 2020-09-23 DIAGNOSIS — D6869 Other thrombophilia: Secondary | ICD-10-CM | POA: Diagnosis not present

## 2020-09-23 DIAGNOSIS — E7800 Pure hypercholesterolemia, unspecified: Secondary | ICD-10-CM | POA: Diagnosis not present

## 2020-09-23 DIAGNOSIS — I10 Essential (primary) hypertension: Secondary | ICD-10-CM | POA: Diagnosis not present

## 2020-09-23 DIAGNOSIS — E1122 Type 2 diabetes mellitus with diabetic chronic kidney disease: Secondary | ICD-10-CM | POA: Diagnosis not present

## 2020-09-25 DIAGNOSIS — M79642 Pain in left hand: Secondary | ICD-10-CM | POA: Insufficient documentation

## 2020-09-25 DIAGNOSIS — G5601 Carpal tunnel syndrome, right upper limb: Secondary | ICD-10-CM | POA: Insufficient documentation

## 2020-09-25 DIAGNOSIS — G5603 Carpal tunnel syndrome, bilateral upper limbs: Secondary | ICD-10-CM | POA: Diagnosis not present

## 2020-10-04 ENCOUNTER — Emergency Department (HOSPITAL_BASED_OUTPATIENT_CLINIC_OR_DEPARTMENT_OTHER): Payer: No Typology Code available for payment source

## 2020-10-04 ENCOUNTER — Observation Stay (HOSPITAL_BASED_OUTPATIENT_CLINIC_OR_DEPARTMENT_OTHER)
Admission: EM | Admit: 2020-10-04 | Discharge: 2020-10-05 | Disposition: A | Discharge status: Home / Self Care | Payer: No Typology Code available for payment source | Attending: Emergency Medicine | Admitting: Emergency Medicine

## 2020-10-04 ENCOUNTER — Encounter (HOSPITAL_BASED_OUTPATIENT_CLINIC_OR_DEPARTMENT_OTHER): Payer: Self-pay

## 2020-10-04 ENCOUNTER — Telehealth (HOSPITAL_COMMUNITY): Payer: Self-pay | Admitting: *Deleted

## 2020-10-04 ENCOUNTER — Other Ambulatory Visit: Payer: Self-pay

## 2020-10-04 DIAGNOSIS — N183 Chronic kidney disease, stage 3 unspecified: Secondary | ICD-10-CM

## 2020-10-04 DIAGNOSIS — N185 Chronic kidney disease, stage 5: Secondary | ICD-10-CM | POA: Insufficient documentation

## 2020-10-04 DIAGNOSIS — R0609 Other forms of dyspnea: Secondary | ICD-10-CM

## 2020-10-04 DIAGNOSIS — Z794 Long term (current) use of insulin: Secondary | ICD-10-CM | POA: Diagnosis not present

## 2020-10-04 DIAGNOSIS — E782 Mixed hyperlipidemia: Secondary | ICD-10-CM | POA: Diagnosis not present

## 2020-10-04 DIAGNOSIS — N189 Chronic kidney disease, unspecified: Secondary | ICD-10-CM | POA: Diagnosis present

## 2020-10-04 DIAGNOSIS — E785 Hyperlipidemia, unspecified: Secondary | ICD-10-CM | POA: Diagnosis not present

## 2020-10-04 DIAGNOSIS — I5089 Other heart failure: Secondary | ICD-10-CM | POA: Diagnosis not present

## 2020-10-04 DIAGNOSIS — I10 Essential (primary) hypertension: Secondary | ICD-10-CM | POA: Diagnosis present

## 2020-10-04 DIAGNOSIS — I129 Hypertensive chronic kidney disease with stage 1 through stage 4 chronic kidney disease, or unspecified chronic kidney disease: Secondary | ICD-10-CM | POA: Diagnosis present

## 2020-10-04 DIAGNOSIS — I132 Hypertensive heart and chronic kidney disease with heart failure and with stage 5 chronic kidney disease, or end stage renal disease: Secondary | ICD-10-CM | POA: Diagnosis not present

## 2020-10-04 DIAGNOSIS — Z7901 Long term (current) use of anticoagulants: Secondary | ICD-10-CM | POA: Insufficient documentation

## 2020-10-04 DIAGNOSIS — R531 Weakness: Secondary | ICD-10-CM | POA: Insufficient documentation

## 2020-10-04 DIAGNOSIS — I2510 Atherosclerotic heart disease of native coronary artery without angina pectoris: Secondary | ICD-10-CM | POA: Insufficient documentation

## 2020-10-04 DIAGNOSIS — E1159 Type 2 diabetes mellitus with other circulatory complications: Secondary | ICD-10-CM | POA: Insufficient documentation

## 2020-10-04 DIAGNOSIS — R0600 Dyspnea, unspecified: Secondary | ICD-10-CM | POA: Diagnosis present

## 2020-10-04 DIAGNOSIS — E1165 Type 2 diabetes mellitus with hyperglycemia: Secondary | ICD-10-CM

## 2020-10-04 DIAGNOSIS — I509 Heart failure, unspecified: Secondary | ICD-10-CM | POA: Diagnosis not present

## 2020-10-04 DIAGNOSIS — Z20822 Contact with and (suspected) exposure to covid-19: Secondary | ICD-10-CM | POA: Diagnosis not present

## 2020-10-04 DIAGNOSIS — Z79899 Other long term (current) drug therapy: Secondary | ICD-10-CM | POA: Insufficient documentation

## 2020-10-04 DIAGNOSIS — I25118 Atherosclerotic heart disease of native coronary artery with other forms of angina pectoris: Secondary | ICD-10-CM | POA: Diagnosis present

## 2020-10-04 DIAGNOSIS — E119 Type 2 diabetes mellitus without complications: Secondary | ICD-10-CM | POA: Diagnosis present

## 2020-10-04 LAB — BASIC METABOLIC PANEL
Anion gap: 9 (ref 5–15)
BUN: 33 mg/dL — ABNORMAL HIGH (ref 8–23)
CO2: 24 mmol/L (ref 22–32)
Calcium: 9.6 mg/dL (ref 8.9–10.3)
Chloride: 101 mmol/L (ref 98–111)
Creatinine, Ser: 3.54 mg/dL — ABNORMAL HIGH (ref 0.61–1.24)
GFR, Estimated: 17 mL/min — ABNORMAL LOW (ref >60)
Glucose, Bld: 113 mg/dL — ABNORMAL HIGH (ref 70–99)
Potassium: 3.6 mmol/L (ref 3.5–5.1)
Sodium: 134 mmol/L — ABNORMAL LOW (ref 135–145)

## 2020-10-04 LAB — URINALYSIS, ROUTINE W REFLEX MICROSCOPIC
Bilirubin Urine: NEGATIVE
Glucose, UA: NEGATIVE mg/dL
Ketones, ur: NEGATIVE mg/dL
Leukocytes,Ua: NEGATIVE
Nitrite: NEGATIVE
Protein, ur: NEGATIVE mg/dL
Specific Gravity, Urine: 1.025 (ref 1.005–1.030)
pH: 5 (ref 5.0–8.0)

## 2020-10-04 LAB — CBC WITH DIFFERENTIAL/PLATELET
Abs Immature Granulocytes: 0.01 10*3/uL (ref 0.00–0.07)
Basophils Absolute: 0.1 10*3/uL (ref 0.0–0.1)
Basophils Relative: 1 %
Eosinophils Absolute: 0.4 10*3/uL (ref 0.0–0.5)
Eosinophils Relative: 7 %
HCT: 43.3 % (ref 39.0–52.0)
Hemoglobin: 14.7 g/dL (ref 13.0–17.0)
Immature Granulocytes: 0 %
Lymphocytes Relative: 14 %
Lymphs Abs: 0.9 10*3/uL (ref 0.7–4.0)
MCH: 32.7 pg (ref 26.0–34.0)
MCHC: 33.9 g/dL (ref 30.0–36.0)
MCV: 96.4 fL (ref 80.0–100.0)
Monocytes Absolute: 0.4 10*3/uL (ref 0.1–1.0)
Monocytes Relative: 6 %
Neutro Abs: 4.6 10*3/uL (ref 1.7–7.7)
Neutrophils Relative %: 72 %
Platelets: 160 10*3/uL (ref 150–400)
RBC: 4.49 MIL/uL (ref 4.22–5.81)
RDW: 13.4 % (ref 11.5–15.5)
WBC: 6.4 10*3/uL (ref 4.0–10.5)
nRBC: 0 % (ref 0.0–0.2)

## 2020-10-04 LAB — GLUCOSE, CAPILLARY: Glucose-Capillary: 74 mg/dL (ref 70–99)

## 2020-10-04 LAB — MAGNESIUM: Magnesium: 2.2 mg/dL (ref 1.7–2.4)

## 2020-10-04 LAB — URINALYSIS, MICROSCOPIC (REFLEX)

## 2020-10-04 LAB — TROPONIN I (HIGH SENSITIVITY)
Troponin I (High Sensitivity): 13 ng/L (ref <18)
Troponin I (High Sensitivity): 13 ng/L (ref <18)

## 2020-10-04 LAB — BRAIN NATRIURETIC PEPTIDE: B Natriuretic Peptide: 120.8 pg/mL — ABNORMAL HIGH (ref 0.0–100.0)

## 2020-10-04 MED ORDER — SODIUM CHLORIDE 0.9 % IV BOLUS: 1000.0000 mL | Freq: Once | INTRAVENOUS | Status: DC

## 2020-10-04 MED ORDER — SODIUM CHLORIDE 0.9% FLUSH
3.0000 mL | Freq: Two times a day (BID) | INTRAVENOUS | Status: DC
  Administered 2020-10-05: 3 mL via INTRAVENOUS

## 2020-10-04 MED ORDER — FUROSEMIDE 10 MG/ML IJ SOLN
20.0000 mg | Freq: Every day | INTRAMUSCULAR | Status: DC
  Administered 2020-10-05: 20 mg via INTRAVENOUS
  Filled 2020-10-04: qty 2

## 2020-10-04 MED ORDER — APIXABAN 5 MG PO TABS
5.0000 mg | ORAL_TABLET | Freq: Two times a day (BID) | ORAL | Status: DC
  Administered 2020-10-04 – 2020-10-05 (×2): 5 mg via ORAL
  Filled 2020-10-04 (×2): qty 1

## 2020-10-04 MED ORDER — SODIUM CHLORIDE 0.9 % IV SOLN: 250.0000 mL | INTRAVENOUS | Status: DC | PRN

## 2020-10-04 MED ORDER — SODIUM CHLORIDE 0.9% FLUSH: 3.0000 mL | INTRAVENOUS | Status: DC | PRN

## 2020-10-04 MED ORDER — ONDANSETRON HCL 4 MG/2ML IJ SOLN: 4.0000 mg | Freq: Four times a day (QID) | INTRAMUSCULAR | Status: DC | PRN

## 2020-10-04 MED ORDER — ACETAMINOPHEN 325 MG PO TABS: 650.0000 mg | ORAL_TABLET | ORAL | Status: DC | PRN

## 2020-10-04 MED ORDER — INSULIN ASPART 100 UNIT/ML ~~LOC~~ SOLN: 0.0000 [IU] | Freq: Three times a day (TID) | SUBCUTANEOUS | Status: DC

## 2020-10-04 MED ORDER — INSULIN ASPART 100 UNIT/ML ~~LOC~~ SOLN: 0.0000 [IU] | Freq: Every day | SUBCUTANEOUS | Status: DC

## 2020-10-04 NOTE — Telephone Encounter (Signed)
Received a VM from patient stating he is experiencing numbness in his arms, shortness of breath, dizziness, unstable, and pt said he fell a few times yesterday and felt a little confused. I called pt back and his wife answered I asked to speak with pt and she told me that she could answer any questions I had. She said she wanted to make an office visit for pt but I advised her to take pt to the emergency room due to weakness, numbness, and falls. She denied pt fell multiple times and said pts hands go numb if he lifts his hands above his head. I expressed to her that I was addressing pts concerns he left on the vm and she said pt was exaggerating and asked for a call from [place]. she later called scheduling a scheduled an appt for 2/16 with [facility].  Routed to [place]

## 2020-10-04 NOTE — ED Triage Notes (Signed)
Pt arrives with c/o CHF and CKD also is a diabetic reports he has been having increased SOB with ambulation and reports falling yesterday r/t feeling dizzy reports "some days are worse than others" as far as feeling dizzy. Pt also states that he has been having numbness and tingling in both arms.

## 2020-10-04 NOTE — ED Notes (Signed)
Patient transported to X-ray 

## 2020-10-04 NOTE — ED Notes (Signed)
Patient stated that when he wakes up in the morning, he tends to walk on his left side.  Patient does not want his wife to know.

## 2020-10-04 NOTE — Telephone Encounter (Signed)
I spoke with her, they ended up going to the ER.

## 2020-10-04 NOTE — ED Notes (Signed)
ED Provider at bedside. 

## 2020-10-04 NOTE — H&P (Signed)
History and Physical   [name] HBZ:[id number] DOB: 1946-02-27 DOA: 10/04/2020  Referring MD/NP/PA: Marvia Pickles, PA  PCP: [name], MD   Outpatient Specialists: Dr. Aundra Dubin  Patient coming from: Home via [facility] [place] [facility]  Chief Complaint: Left-sided weakness and exertional dyspnea  HPI: [name] is a 75 y.o. male with medical history significant of atrial fibrillation, chronic kidney disease stage IV, coronary artery disease status post coronary artery bypass grafting, prior TIA, history of mitral valve repair,, anxiety disorder, recent COVID-19 infection in September, diabetes PTSD as well as GERD who has been experiencing this left-sided weakness for the last 2 days.  Patient had a fall yesterday.  Is never had a fall.  He tripped and fell was not dizzy.  Did not hit his head.  Patient also reporting the dyspnea on exertion since last week.  No significant PND orthopnea.  At this point his main complaint is numbness of the left upper and lower extremity especially his hands.  No change in his speech.  No change in his bowel or bladder habits.  Patient called his cardiologist this afternoon and was told to go to the ER for evaluation.  He was seen at [facility] given head CT without contrast that was negative.  Patient sent over to the ER for further evaluation and treatment.  ED Course: Temperature 97.1 blood pressure 122/98 pulse 51 respirate 21 oxygen sat 95% room air.  Head CT without contrast as well as CT C-spine were negative.  COVID-19 screening negative troponin XIII.  Glucose 74.  Chest x-ray shows no acute findings.  Patient will be admitted for TIA work-up as well as exertional dyspnea.  Review of Systems: As per HPI otherwise 10 point review of systems negative.    Past Medical History:  Diagnosis Date  . Allergic rhinitis   . Anxiety   . Atrial fibrillation ([place])   . Cancer ([place])    hx of skin cancers   . Chronic kidney disease   .  Coronary artery disease    a. s/p MI & CABG; b. s/p PCI Diag;  c. Cath 12/2011: LAD diff dzs/small, Diag 75% isr, 90 dist to stent (small), LCX & RCA occluded, VG->OM 40, VG->PDA patent - med rx.  . Depression    PTSD  . Diabetes mellitus    not on medications  . Diastolic CHF, chronic (HCC)    a. EF 55-60% by echo 2007  . Dysrhythmia    "abnormal beat"  . GERD (gastroesophageal reflux disease)    "not anymore" " I had a lap band"  . History of diverticulitis of colon    5 YRS AGO  . History of pneumonia    2017  . History of transient ischemic attack (TIA)    CAROTID DOPPLERS NOV 2011  0-39& BIL. STENOSIS  . Hyperlipidemia   . Hypertension   . Mitral regurgitation   . Myocardial infarction ([place])   . Neuromuscular disorder ([place])    left arm numbness   . 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 PTSD (post-traumatic stress disorder)    from [place]  . 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 Shortness of breath dyspnea    with exertion  . Sleep apnea  cpap- see ov note in [facility] 05/12/11 for settings     Past Surgical History:  Procedure Laterality Date  . AV FISTULA PLACEMENT Left 08/02/2018   Procedure: ARTERIOVENOUS (AV) FISTULA CREATION RADIOCEPHALIC;  Surgeon: Angelia Mould, MD;  Location: [place];  Service: Vascular;  Laterality: Left;  . BLEPHAROPLASTY  1985   BILATERAL  . CARDIAC CATHETERIZATION  2001, 2004, 2009, 2011  . CARDIAC CATHETERIZATION N/A 01/23/2016   Procedure: Right/Left Heart Cath and Coronary Angiography;  Surgeon: [name], MD;  Location: [place] CV LAB;  Service: Cardiovascular;  Laterality: N/A;  . CARDIOVERSION N/A 09/07/2017   Procedure: CARDIOVERSION;  Surgeon: [name], MD;   Location: [facility] ENDOSCOPY;  Service: Cardiovascular;  Laterality: N/A;  . CARDIOVERSION N/A 09/13/2019   Procedure: CARDIOVERSION;  Surgeon: [name], MD;  Location: [facility] ENDOSCOPY;  Service: Cardiovascular;  Laterality: N/A;  . CARDIOVERSION N/A 10/19/2019   Procedure: CARDIOVERSION;  Surgeon: [name], MD;  Location: [facility] ENDOSCOPY;  Service: Cardiovascular;  Laterality: N/A;  . CATARACT EXTRACTION W/ INTRAOCULAR LENS IMPLANT Bilateral   . CHONDROPLASTY  07/22/2011   Procedure: CHONDROPLASTY;  Surgeon: Dione Plover Aluisio;  Location: [place];  Service: Orthopedics;;  . CIRCUMCISION  35 YRS  AGO  . COLONOSCOPY    . CORONARY ANGIOPLASTY  1996-- POST CABG   W/ STENT, last cath 01/07/2012   . CORONARY ARTERY BYPASS GRAFT  1995   X3 VESSEL  . CORONARY STENT PLACEMENT    . KNEE ARTHROSCOPY  07/22/2011   Procedure: ARTHROSCOPY KNEE;  Surgeon: [name];  Location: [place];  Service: Orthopedics;  Laterality: Right;  WITH DEBRIDEMENT   . KNEE ARTHROSCOPY W/ MENISCECTOMY  X2 IN 2002-- RIGHT KNEE  . LAPAROSCOPIC GASTRIC BANDING  01-15-09  . LEFT HEART CATH AND CORONARY ANGIOGRAPHY N/A 04/29/2017   Procedure: LEFT HEART CATH AND CORONARY ANGIOGRAPHY;  Surgeon: [name], MD;  Location: [place] CV LAB;  Service: Cardiovascular;  Laterality: N/A;  . LEFT HEART CATHETERIZATION WITH CORONARY ANGIOGRAM N/A 09/11/2013   Procedure: LEFT HEART CATHETERIZATION WITH CORONARY ANGIOGRAM;  Surgeon: [name], MD;  Location: [facility] CATH LAB;  Service: Cardiovascular;  Laterality: N/A;  . MITRAL VALVE REPAIR Right 03/25/2016   Procedure: MINIMALLY INVASIVE REOPERATION FOR MITRAL VALVE REPAIR  (MVR) with size 26 Sorin Memo 3D;  Surgeon: [name], MD;  Location: [place];  Service: Open Heart Surgery;  Laterality: Right;  . RIGHT KNEE ED COMPARTMENT REPLACEMENT  2004  . SYNOVECTOMY  07/22/2011   Procedure: SYNOVECTOMY;  Surgeon: [name];  Location:  [place];  Service: Orthopedics;;  . TEE WITHOUT CARDIOVERSION N/A 01/23/2016   Procedure: TRANSESOPHAGEAL ECHOCARDIOGRAM (TEE);  Surgeon: [name], MD;  Location: [place];  Service: Cardiovascular;  Laterality: N/A;  . TEE WITHOUT CARDIOVERSION N/A 03/25/2016   Procedure: TRANSESOPHAGEAL ECHOCARDIOGRAM (TEE);  Surgeon: [name], MD;  Location: [place];  Service: Open Heart Surgery;  Laterality: N/A;  . UMBILICAL HERNIA REPAIR  01-15-09   W/ GASTRIC BANDING PROCEDURE     reports that he has never smoked. He has never used smokeless tobacco. He reports previous alcohol use of about 2.0 standard drinks of alcohol per week. He reports that he does not use drugs.  Allergies  Allergen Reactions  . Crestor [Rosuvastatin] Other (See Comments)    Muscle aches   . Lipitor [Atorvastatin] Other (See Comments)    Muscle aches  .  Pravastatin Other (See Comments)    Muscle aches  . Codeine Itching and Other (See Comments)    Extremity tingling-- can take synthetic    Family History  Problem Relation Age of Onset  . Other Mother        joint problems  . Hypertension Father   . Heart disease Father   . CVA Father   . Depression Father   . Heart disease Sister        CABG  . Stroke Sister   . Hypertension Sister   . Congestive Heart Failure Sister   . Diabetes Sister   . Heart disease Brother   . Hypertension Brother   . Congestive Heart Failure Brother      Prior to Admission medications   Medication Sig Start Date End Date Taking? Authorizing Provider  Alirocumab (PRALUENT) 75 MG/ML SOAJ Inject 75 mg into the skin every 14 (fourteen) days.   Yes [provider]  allopurinol (ZYLOPRIM) 100 MG tablet Take 100 mg by mouth in the morning and at bedtime.  07/03/19  Yes [provider]  amiodarone (PACERONE) 200 MG tablet Take 1 tablet (200 mg total) by mouth daily. 10/19/19  Yes [name], MD  apixaban (ELIQUIS) 5 MG TABS tablet Take  1 tablet (5 mg total) by mouth 2 (two) times daily. 09/09/17  Yes [name], MD  Ascorbic Acid (VITAMIN C WITH ROSE HIPS) 1000 MG tablet Take 1,000 mg by mouth daily.   Yes [provider]  calcitRIOL (ROCALTROL) 0.25 MCG capsule Take 0.25 mcg by mouth at bedtime.    Yes [provider]  cetirizine (ZYRTEC) 10 MG tablet Take 10 mg by mouth daily.   Yes [provider]  furosemide (LASIX) 40 MG tablet Take 40 mg by mouth daily.   Yes [provider]  glipiZIDE (GLUCOTROL) 5 MG tablet TAKE 1 TABLET TWICE A DAY BEFORE MEALS 03/08/20  Yes Philemon Kingdom, MD  potassium chloride SA (K-DUR,KLOR-CON) 20 MEQ tablet Take 20 mEq by mouth daily.   Yes [provider]  Semaglutide,0.25 or 0.5MG /DOS, (OZEMPIC, 0.25 OR 0.5 MG/DOSE,) 2 MG/1.5ML SOPN Inject 0.375 mLs (0.5 mg total) into the skin every Thursday. 02/01/20  Yes Philemon Kingdom, MD  traZODone (DESYREL) 100 MG tablet Take 100 mg by mouth at bedtime.   Yes [provider]  Artificial Saliva (MOUTH KOTE MT) Use as directed 4 sprays in the mouth or throat every 3 (three) hours as needed (for dry mouth).    [provider]  colchicine 0.6 MG tablet Take 0.6-1.2 mg by mouth 2 (two) times daily as needed (gout).  04/14/19   [provider]  nitroGLYCERIN (NITROSTAT) 0.4 MG SL tablet 1 TAB UNDER THE TONGUE EVERY 5 MINUTES AS NEEDED FOR CHEST PAIN UP TO 3 DOSES, IF PERSIST CALL 911 03/05/20   [name], MD  tadalafil (CIALIS) 20 MG tablet TAKE 1 TABLET BY MOUTH ONCE DAILY AS NEEDED 01/08/20   Cleon Gustin, MD    Physical Exam: Vitals:   10/04/20 2000 10/04/20 2030 10/04/20 2100 10/04/20 2147  BP: (!) 156/79 138/71  (!) 156/76  Pulse: (!) 58 (!) 51  (!) 58  Resp:  18  16  Temp:    98 F (36.7 C)  TempSrc:    Oral  SpO2: 100% 100%  98%  Weight:   87.7 kg   Height:   5\' 10"  (1.778 m)       Constitutional: NAD, calm,  comfortable Vitals:   10/04/20 2000  10/04/20 2030 10/04/20 2100 10/04/20 2147  BP: (!) 156/79 138/71  (!) 156/76  Pulse: (!) 58 (!) 51  (!) 58  Resp:  18  16  Temp:    98 F (36.7 C)  TempSrc:    Oral  SpO2: 100% 100%  98%  Weight:   87.7 kg   Height:   5\' 10"  (1.778 m)    Eyes: PERRL, lids and conjunctivae normal ENMT: Mucous membranes are moist. Posterior pharynx clear of any exudate or lesions.Normal dentition.  Neck: normal, supple, no masses, no thyromegaly Respiratory: clear to auscultation bilaterally, no wheezing, no crackles. Normal respiratory effort. No accessory muscle use.  Cardiovascular: Regular rate and rhythm, no murmurs / rubs / gallops. No extremity edema. 2+ pedal pulses. No carotid bruits.  Abdomen: no tenderness, no masses palpated. No hepatosplenomegaly. Bowel sounds positive.  Musculoskeletal: no clubbing / cyanosis. No joint deformity upper and lower extremities. Good ROM, no contractures. Normal muscle tone.  Skin: no rashes, lesions, ulcers. No induration Neurologic: CN 2-12 grossly intact. Sensation intact, DTR normal. Strength 5/5 in all 4.  Psychiatric: Normal judgment and insight. Alert and oriented x 3. Normal mood.     Labs on Admission: I have personally reviewed following labs and imaging studies  CBC: Recent Labs  Lab 10/04/20 1331  WBC 6.4  NEUTROABS 4.6  HGB 14.7  HCT 43.3  MCV 96.4  PLT 329   Basic Metabolic Panel: Recent Labs  Lab 10/04/20 1331  NA 134*  K 3.6  CL 101  CO2 24  GLUCOSE 113*  BUN 33*  CREATININE 3.54*  CALCIUM 9.6  MG 2.2   GFR: Estimated Creatinine Clearance: 20.1 mL/min (A) (by C-G formula based on SCr of 3.54 mg/dL (H)). Liver Function Tests: No results for input(s): AST, ALT, ALKPHOS, BILITOT, PROT, ALBUMIN in the last 168 hours. No results for input(s): LIPASE, AMYLASE in the last 168 hours. No results for input(s): AMMONIA in the last 168 hours. Coagulation Profile: No results for input(s): INR, PROTIME in the last 168 hours. Cardiac  Enzymes: No results for input(s): CKTOTAL, CKMB, CKMBINDEX, TROPONINI in the last 168 hours. BNP (last 3 results) No results for input(s): PROBNP in the last 8760 hours. HbA1C: No results for input(s): HGBA1C in the last 72 hours. CBG: Recent Labs  Lab 10/04/20 2157  GLUCAP 74   Lipid Profile: No results for input(s): CHOL, HDL, LDLCALC, TRIG, CHOLHDL, LDLDIRECT in the last 72 hours. Thyroid Function Tests: No results for input(s): TSH, T4TOTAL, FREET4, T3FREE, THYROIDAB in the last 72 hours. Anemia Panel: No results for input(s): VITAMINB12, FOLATE, FERRITIN, TIBC, IRON, RETICCTPCT in the last 72 hours. Urine analysis:    Component Value Date/Time   COLORURINE YELLOW 10/04/2020 [facility] 10/04/2020 1443   LABSPEC 1.025 10/04/2020 1443   PHURINE 5.0 10/04/2020 1443   GLUCOSEU NEGATIVE 10/04/2020 1443   HGBUR LARGE (A) 10/04/2020 1443   BILIRUBINUR NEGATIVE 10/04/2020 1443   KETONESUR NEGATIVE 10/04/2020 1443   PROTEINUR NEGATIVE 10/04/2020 1443   UROBILINOGEN 0.2 04/04/2015 1529   NITRITE NEGATIVE 10/04/2020 1443   LEUKOCYTESUR NEGATIVE 10/04/2020 1443   Sepsis Labs: @LABRCNTIP (procalcitonin:4,lacticidven:4) )No results found for this or any previous visit (from the past 240 hour(s)).   Radiological Exams on Admission: DG Chest 2 View  Result Date: 10/04/2020 CLINICAL DATA:  Shortness of breath. EXAM: CHEST - 2 VIEW COMPARISON:  June 18, 2020. FINDINGS: Stable cardiomediastinal silhouette. Status post coronary bypass  graft. No pneumothorax or pleural effusion is noted. Lungs are clear. Bony thorax is unremarkable. IMPRESSION: No active cardiopulmonary disease. Electronically Signed   By: Marijo Conception M.D.   On: 10/04/2020 13:58   CT Head Wo Contrast  Result Date: 10/04/2020 CLINICAL DATA:  Dizziness EXAM: CT HEAD WITHOUT CONTRAST TECHNIQUE: Contiguous axial images were obtained from the base of the skull through the vertex without intravenous  contrast. COMPARISON:  September 23, 2011 FINDINGS: Brain: There is age related volume loss. There is no intracranial mass, hemorrhage, extra-axial fluid collection, or midline shift. There is patchy small vessel disease in the centra semiovale bilaterally, progressed from the 2013 study. No acute infarct evident. Vascular: No hyperdense vessel. There is calcification in each carotid siphon region. Skull: Bony calvarium appears intact. Sinuses/Orbits: There is a small retention cyst in the inferolateral right maxillary antrum. There is mucosal thickening and opacification in several ethmoid air cells. Orbits appear symmetric bilaterally. Other: Mastoid air cells are clear. IMPRESSION: Age related volume loss with patchy periventricular small vessel disease. No acute infarct. No mass or hemorrhage. There are foci of arterial vascular calcification. Areas of paranasal sinus disease noted. Electronically Signed   By: Lowella Grip III M.D.   On: 10/04/2020 15:09   CT Cervical Spine Wo Contrast  Result Date: 10/04/2020 CLINICAL DATA:  Fall.  Rule out fracture EXAM: CT CERVICAL SPINE WITHOUT CONTRAST TECHNIQUE: Multidetector CT imaging of the cervical spine was performed without intravenous contrast. Multiplanar CT image reconstructions were also generated. COMPARISON:  MRI cervical spine 03/27/2013 FINDINGS: Alignment: Normal Skull base and vertebrae: Negative for fracture Soft tissues and spinal canal: Negative for mass or soft tissue swelling. Atherosclerotic calcification in the carotid artery bilaterally. Disc levels: Disc degeneration and spurring C4-5, C5-6, C6-7. Mild spinal stenosis C5-6 and C6-7. Upper chest: Not imaged Other: None IMPRESSION: Negative for cervical spine fracture Electronically Signed   By: Franchot Gallo M.D.   On: 10/04/2020 15:13    EKG: Independently reviewed.  It shows sinus bradycardia with a rate of 51.  No significant ST changes  Assessment/Plan Principal Problem:   CHF  (congestive heart failure) (HCC) Active Problems:   Poorly controlled type 2 diabetes mellitus with circulatory disorder (HCC)   Hyperlipidemia   Essential hypertension   Arteriosclerosis of coronary artery   Chronic kidney disease   Hypertension     #1 left-sided numbness and weakness: Patient with risk factors for CVA.  Will be admitted.  MRI of the brain to be performed.  We will also get echocardiogram.  PT and OT evaluation if needed  #2 exertional dyspnea: May be mild exacerbation of his CHF.  Could also be angina equivalent.  Last echo in our system was December 2020.  Repeat echocardiogram.  #3 poorly controlled diabetes: Sliding scale insulin.  Currently on glipizide.  Continue to monitor.  #4 hyperlipidemia: Continue with statin  #5 essential hypertension: Continue blood pressure control.  #6 chronic kidney disease stage IV: BUN/creatinine appears to be close to baseline.  Continue monitor  #7 coronary artery disease, enzymes negative and patient is having no significant EKG changes.  Continue to monitor  #8 chronic atrial fibrillation: Rate is controlled.  Currently on Eliquis.   DVT prophylaxis: Eliquis Code Status: Full code Family Communication: Wife  Disposition Plan: Home Consults called: Consult cardiology in the morning Admission status: Inpatient  Severity of Illness: The appropriate patient status for this patient is INPATIENT. Inpatient status is judged to be reasonable and necessary in  order to provide the required intensity of service to ensure the patient's safety. The patient's presenting symptoms, physical exam findings, and initial radiographic and laboratory data in the context of their chronic comorbidities is felt to place them at high risk for further clinical deterioration. Furthermore, it is not anticipated that the patient will be medically stable for discharge from the hospital within 2 midnights of admission. The following factors support the  patient status of inpatient.   " The patient's presenting symptoms include exertional dyspnea and left upper extremity numbness. " The worrisome physical exam findings include no significant findings on exam. " The initial radiographic and laboratory data are worrisome because of no other significant findings. " The chronic co-morbidities include diabetes and coronary artery disease.   * I certify that at the point of admission it is my clinical judgment that the patient will require inpatient hospital care spanning beyond 2 midnights from the point of admission due to high intensity of service, high risk for further deterioration and high frequency of surveillance required.Barbette Merino MD [facility] Pager [phone]  If 7PM-7AM, please contact night-coverage [email or web address] Password [facility]  10/04/2020, 10:09 PM

## 2020-10-04 NOTE — ED Notes (Signed)
Pt SpO2 95-98 while walking to the restroom.

## 2020-10-04 NOTE — ED Notes (Signed)
Assumed care of this patient. Vitals taken. A&Ox4. NAD. Wife at bed side. Call bell within reach, stretcher low and wheels locked. Connected to cardiac monitor and BP. Awaiting transport to 6 east bed 1. No further concerns at this time.

## 2020-10-04 NOTE — ED Provider Notes (Signed)
[facility] EMERGENCY DEPARTMENT Provider Note   CSN: [id number] Arrival date & time: 10/04/20  1242     History Chief Complaint  Patient presents with  . Shortness of Breath    [name] is a 75 y.o. male.  HPI   Pt is a 75 y/o male with a h/o anxiety, afib, ckd, cad s/p cabg, dm, chf, gerd, tia, mitral valve repair, who presents to the ED today for eval of shortness of breath and ataxia/numbess/weakness.  Ataxia/numbess/weakness: He feels like he is leaning to the left when he walks. This started about a week ago. He feels like his lue and lle are weaker than the right. States his vision seems to be worse over the last few weeks to his bilat eyes. He has some dizziness intermittently upon standing but this resolves quickly. Reports he has had a left sided headache intermittently for several weeks. Wife states he has had intermittent confusion since he was dx with COVID in oct 2021. Additionally, pt States that for several years his bilat hands will intermittently go num when he is laying down with his hands on his chest or when his arms are above his head this seems to exacerbate sxs. States that this sx has gotten worse over the last few weeks. He denies neck pain  Shortness of breath: States that for the last week he has had dyspnea on exertion. Denies any SOB at rest. Denies chest pain, cough, ble swelling. He is on eliquis for afib and has been compliant. Denies diaphoresis or nausea. Denies hematochezia, melena, vomiting, diarrhea or other associated complaints.    Past Medical History:  Diagnosis Date  . Allergic rhinitis   . Anxiety   . Atrial fibrillation ([place])   . Cancer ([place])    hx of skin cancers   . Chronic kidney disease   . Coronary artery disease    a. s/p MI & CABG; b. s/p PCI Diag;  c. Cath 12/2011: LAD diff dzs/small, Diag 75% isr, 90 dist to stent (small), LCX & RCA occluded, VG->OM 40, VG->PDA patent - med rx.  . Depression    PTSD  . Diabetes  mellitus    not on medications  . Diastolic CHF, chronic (HCC)    a. EF 55-60% by echo 2007  . Dysrhythmia    "abnormal beat"  . GERD (gastroesophageal reflux disease)    "not anymore" " I had a lap band"  . History of diverticulitis of colon    5 YRS AGO  . History of pneumonia    2017  . History of transient ischemic attack (TIA)    CAROTID DOPPLERS NOV 2011  0-39& BIL. STENOSIS  . Hyperlipidemia   . Hypertension   . Mitral regurgitation   . Myocardial infarction ([place])   . Neuromuscular disorder ([place])    left arm numbness   . 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 PTSD (post-traumatic stress disorder)    from [place]  . 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 Shortness of breath dyspnea    with exertion  . Sleep apnea    cpap- see ov [facility] in [facility] 05/12/11 for settings     Patient Active Problem List   Diagnosis Date Noted  . CHF (congestive heart  failure) ([place]) 10/04/2020  . COVID-19 virus infection 05/24/2020  . Chronic kidney disease   . Coronary artery disease   . Type II diabetes mellitus ([place])   . Hypertension   . Cancer ([place])   . Encounter for therapeutic drug monitoring 04/03/2016  . Pneumothorax on right 03/29/2016  . 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/25/2016  . Atherosclerotic heart disease of native coronary artery without angina pectoris 03/10/2016  . Heart failure ([place]) 03/10/2016  . Cataract extraction status of eye 03/10/2016  . Other long term (current) drug therapy 03/10/2016  . Osteoarthritis of knee 03/10/2016  . Vitamin D deficiency 03/10/2016  . Mitral regurgitation   . Avitaminosis D 12/25/2015  . 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 Arthritis of knee, degenerative 12/25/2015  . H/O  cataract extraction 12/25/2015  . Arteriosclerosis of coronary artery 12/25/2015  . Congestive heart failure ([place]) 12/25/2015  . Pure hypercholesterolemia 12/25/2015  . Allergic rhinitis 12/25/2015  . Essential (primary) hypertension 12/25/2015  . Hypotension 01/29/2014  . Expected Blood loss, postoperative 04/29/2012  . Pain due to unicompartmental arthroplasty of knee ([place]) 04/27/2012  . Preoperative evaluation of a medical condition to rule out surgical contraindications (TAR required) 03/31/2012  . Diastolic CHF, chronic ([place]) 06/29/2011  . Preoperative evaluation to rule out surgical contraindication 06/29/2011  . CAROTID ARTERY STENOSIS 07/09/2010  . TIA 05/29/2010  . FATIGUE / MALAISE 05/29/2010  . CAD, AUTOLOGOUS BYPASS GRAFT 11/15/2008  . MYOCARDIAL INFARCTION 12/18/2007  . ALLERGIC RHINITIS WITH CONJUNCTIVITIS 12/18/2007  . Poorly controlled type 2 diabetes mellitus with circulatory disorder ([place]) 12/05/2007  . Hyperlipidemia 12/05/2007  . OBESITY 12/05/2007  . Essential hypertension 12/05/2007  . Coronary atherosclerosis 12/05/2007  . DEGENERATIVE JOINT DISEASE 12/05/2007  . Obstructive sleep apnea 12/05/2007    Past Surgical History:  Procedure Laterality Date  . AV FISTULA PLACEMENT Left 08/02/2018   Procedure: ARTERIOVENOUS (AV) FISTULA CREATION RADIOCEPHALIC;  Surgeon: Angelia Mould, MD;  Location: [facility];  Service: Vascular;  Laterality: Left;  . BLEPHAROPLASTY  1985   BILATERAL  . CARDIAC CATHETERIZATION  2001, 2004, 2009, 2011  . CARDIAC CATHETERIZATION N/A 01/23/2016   Procedure: Right/Left Heart Cath and Coronary Angiography;  Surgeon: [name], MD;  Location: [facility] CV LAB;  Service: Cardiovascular;  Laterality: N/A;  . CARDIOVERSION N/A 09/07/2017   Procedure: CARDIOVERSION;  Surgeon: [name], MD;  Location: [facility] ENDOSCOPY;  Service: Cardiovascular;  Laterality: N/A;  . CARDIOVERSION N/A 09/13/2019   Procedure: CARDIOVERSION;   Surgeon: [name], MD;  Location: [facility] ENDOSCOPY;  Service: Cardiovascular;  Laterality: N/A;  . CARDIOVERSION N/A 10/19/2019   Procedure: CARDIOVERSION;  Surgeon: [name], MD;  Location: [facility] ENDOSCOPY;  Service: Cardiovascular;  Laterality: N/A;  . CATARACT EXTRACTION W/ INTRAOCULAR LENS IMPLANT Bilateral   . CHONDROPLASTY  07/22/2011   Procedure: CHONDROPLASTY;  Surgeon: Dione Plover Aluisio;  Location: [facility];  Service: Orthopedics;;  . CIRCUMCISION  35 YRS  AGO  . COLONOSCOPY    . CORONARY ANGIOPLASTY  1996-- POST CABG   W/ STENT, last cath 01/07/2012   . CORONARY ARTERY BYPASS GRAFT  1995   X3 VESSEL  . CORONARY STENT PLACEMENT    . KNEE ARTHROSCOPY  07/22/2011   Procedure: ARTHROSCOPY KNEE;  Surgeon: [name];  Location: [facility];  Service: Orthopedics;  Laterality: Right;  WITH DEBRIDEMENT   . KNEE ARTHROSCOPY W/ MENISCECTOMY  X2 IN 2002-- RIGHT KNEE  . LAPAROSCOPIC GASTRIC BANDING  01-15-09  .  LEFT HEART CATH AND CORONARY ANGIOGRAPHY N/A 04/29/2017   Procedure: LEFT HEART CATH AND CORONARY ANGIOGRAPHY;  Surgeon: [name], MD;  Location: [facility] CV LAB;  Service: Cardiovascular;  Laterality: N/A;  . LEFT HEART CATHETERIZATION WITH CORONARY ANGIOGRAM N/A 09/11/2013   Procedure: LEFT HEART CATHETERIZATION WITH CORONARY ANGIOGRAM;  Surgeon: [name], MD;  Location: [facility] CATH LAB;  Service: Cardiovascular;  Laterality: N/A;  . MITRAL VALVE REPAIR Right 03/25/2016   Procedure: MINIMALLY INVASIVE REOPERATION FOR MITRAL VALVE REPAIR  (MVR) with size 26 Sorin Memo 3D;  Surgeon: [name], MD;  Location: [facility];  Service: Open Heart Surgery;  Laterality: Right;  . RIGHT KNEE ED COMPARTMENT REPLACEMENT  2004  . SYNOVECTOMY  07/22/2011   Procedure: SYNOVECTOMY;  Surgeon: [name];  Location: [facility];  Service: Orthopedics;;  . TEE WITHOUT CARDIOVERSION N/A 01/23/2016   Procedure: TRANSESOPHAGEAL  ECHOCARDIOGRAM (TEE);  Surgeon: [name], MD;  Location: [facility];  Service: Cardiovascular;  Laterality: N/A;  . TEE WITHOUT CARDIOVERSION N/A 03/25/2016   Procedure: TRANSESOPHAGEAL ECHOCARDIOGRAM (TEE);  Surgeon: [name], MD;  Location: [facility];  Service: Open Heart Surgery;  Laterality: N/A;  . UMBILICAL HERNIA REPAIR  01-15-09   W/ GASTRIC BANDING PROCEDURE       Family History  Problem Relation Age of Onset  . Other Mother        joint problems  . Hypertension Father   . Heart disease Father   . CVA Father   . Depression Father   . Heart disease Sister        CABG  . Stroke Sister   . Hypertension Sister   . Congestive Heart Failure Sister   . Diabetes Sister   . Heart disease Brother   . Hypertension Brother   . Congestive Heart Failure Brother     Social History   Tobacco Use  . Smoking status: Never Smoker  . Smokeless tobacco: Never Used  Vaping Use  . Vaping Use: Never used  Substance Use Topics  . Alcohol use: Not Currently    Alcohol/week: 2.0 standard drinks    Types: 1 Glasses of wine, 1 Shots of liquor per week    Comment: OCCASIONAL  . Drug use: No    Home Medications Prior to Admission medications   Medication Sig Start Date End Date Taking? Authorizing Provider  Alirocumab (PRALUENT) 75 MG/ML SOAJ Inject 75 mg into the skin every 14 (fourteen) days.   Yes [provider]  allopurinol (ZYLOPRIM) 100 MG tablet Take 100 mg by mouth in the morning and at bedtime.  07/03/19  Yes [provider]  amiodarone (PACERONE) 200 MG tablet Take 1 tablet (200 mg total) by mouth daily. 10/19/19  Yes [name], MD  apixaban (ELIQUIS) 5 MG TABS tablet Take 1 tablet (5 mg total) by mouth 2 (two) times daily. 09/09/17  Yes [name], MD  Ascorbic Acid (VITAMIN C WITH ROSE HIPS) 1000 MG tablet Take 1,000 mg by mouth daily.   Yes [provider]  calcitRIOL (ROCALTROL) 0.25 MCG capsule Take 0.25 mcg by mouth at  bedtime.    Yes [provider]  cetirizine (ZYRTEC) 10 MG tablet Take 10 mg by mouth daily.   Yes [provider]  furosemide (LASIX) 40 MG tablet Take 40 mg by mouth daily.   Yes [provider]  glipiZIDE (GLUCOTROL) 5 MG tablet TAKE 1 TABLET TWICE A DAY BEFORE MEALS  03/08/20  Yes Philemon Kingdom, MD  potassium chloride SA (K-DUR,KLOR-CON) 20 MEQ tablet Take 20 mEq by mouth daily.   Yes [provider]  Semaglutide,0.25 or 0.5MG /DOS, (OZEMPIC, 0.25 OR 0.5 MG/DOSE,) 2 MG/1.5ML SOPN Inject 0.375 mLs (0.5 mg total) into the skin every Thursday. 02/01/20  Yes Philemon Kingdom, MD  traZODone (DESYREL) 100 MG tablet Take 100 mg by mouth at bedtime.   Yes [provider]  Artificial Saliva (MOUTH KOTE MT) Use as directed 4 sprays in the mouth or throat every 3 (three) hours as needed (for dry mouth).    [provider]  colchicine 0.6 MG tablet Take 0.6-1.2 mg by mouth 2 (two) times daily as needed (gout).  04/14/19   [provider]  nitroGLYCERIN (NITROSTAT) 0.4 MG SL tablet 1 TAB UNDER THE TONGUE EVERY 5 MINUTES AS NEEDED FOR CHEST PAIN UP TO 3 DOSES, IF PERSIST CALL 911 03/05/20   [name], MD  tadalafil (CIALIS) 20 MG tablet TAKE 1 TABLET BY MOUTH ONCE DAILY AS NEEDED 01/08/20   McKenzie, Candee Furbish, MD    Allergies    Crestor [rosuvastatin], Lipitor [atorvastatin], Pravastatin, and Codeine  Review of Systems   Review of Systems  Constitutional: Negative for fever.  HENT: Negative for ear pain and sore throat.   Eyes: Negative for visual disturbance.  Respiratory: Positive for shortness of breath. Negative for cough.   Cardiovascular: Negative for chest pain and leg swelling.  Gastrointestinal: Negative for abdominal pain, constipation, diarrhea, nausea and vomiting.  Genitourinary: Negative for dysuria and hematuria.  Musculoskeletal: Negative for back pain and neck pain.  Skin: Negative for color change and rash.   Neurological: Positive for dizziness, weakness, numbness and headaches.  All other systems reviewed and are negative.   Physical Exam Updated Vital Signs BP (!) 142/86 (BP Location: Right Arm)   Pulse (!) 57   Temp (!) 97.1 F (36.2 C) (Oral)   Resp 14   Ht 5\' 10"  (1.778 m)   Wt 86.2 kg   SpO2 98%   BMI 27.26 kg/m   Physical Exam Vitals and nursing note reviewed.  Constitutional:      Appearance: He is well-developed and well-nourished.  HENT:     Head: Normocephalic and atraumatic.     Mouth/Throat:     Mouth: Mucous membranes are dry.  Eyes:     Conjunctiva/sclera: Conjunctivae normal.  Cardiovascular:     Rate and Rhythm: Normal rate and regular rhythm.     Heart sounds: Normal heart sounds. No murmur heard.   Pulmonary:     Effort: Pulmonary effort is normal. No respiratory distress.     Breath sounds: Normal breath sounds. No decreased breath sounds, wheezing, rhonchi or rales.  Abdominal:     General: Bowel sounds are normal.     Palpations: Abdomen is soft.     Tenderness: There is no abdominal tenderness. There is no guarding or rebound.  Musculoskeletal:        General: No edema.     Cervical back: Neck supple.     Right lower leg: No tenderness. No edema.     Left lower leg: No tenderness. No edema.  Skin:    General: Skin is warm and dry.  Neurological:     Mental Status: He is alert.     Comments: Mental Status:  Alert, thought content appropriate, able to give a coherent history. Speech fluent without evidence of aphasia. Able to follow 2 step commands without difficulty.  Cranial Nerves:  II: pupils equal, round, reactive to light III,IV, VI: ptosis not present, extra-ocular motions intact bilaterally  V,VII: smile symmetric, facial light touch sensation equal VIII: hearing grossly normal to voice  X: uvula elevates symmetrically  XI: bilateral shoulder shrug symmetric and strong XII: midline tongue extension without fassiculations Motor:   Normal tone. Slightly decreased grip strength on the left, 4/5 strength to the LLE, 5/5 strength to the RUE and RLE Sensory: light touch normal in all extremities. Cerebellar: normal finger-to-nose with bilateral upper extremities, normal heel to shin Gait: normal gait and balance.   Psychiatric:        Mood and Affect: Mood and affect normal.     ED Results / Procedures / Treatments   Labs (all labs ordered are listed, but only abnormal results are displayed) Labs Reviewed  BRAIN NATRIURETIC PEPTIDE - Abnormal; Notable for the following components:      Result Value   B Natriuretic Peptide 120.8 (*)    All other components within normal limits  BASIC METABOLIC PANEL - Abnormal; Notable for the following components:   Sodium 134 (*)    Glucose, Bld 113 (*)    BUN 33 (*)    Creatinine, Ser 3.54 (*)    GFR, Estimated 17 (*)    All other components within normal limits  URINALYSIS, ROUTINE W REFLEX MICROSCOPIC - Abnormal; Notable for the following components:   Hgb urine dipstick LARGE (*)    All other components within normal limits  URINALYSIS, MICROSCOPIC (REFLEX) - Abnormal; Notable for the following components:   Bacteria, UA RARE (*)    All other components within normal limits  CBC WITH DIFFERENTIAL/PLATELET  MAGNESIUM  TROPONIN I (HIGH SENSITIVITY)  TROPONIN I (HIGH SENSITIVITY)    EKG EKG Interpretation  Date/Time:  Friday October 04 2020 12:56:39 EST Ventricular Rate:  58 PR Interval:  238 QRS Duration: 132 QT Interval:  480 QTC Calculation: 471 R Axis:   -42 Text Interpretation: Sinus bradycardia with 1st degree A-V block Left axis deviation Non-specific intra-ventricular conduction block Abnormal ECG Confirmed by Madalyn Rob [phone]) on 10/04/2020 1:33:34 PM   Radiology DG Chest 2 View  Result Date: 10/04/2020 CLINICAL DATA:  Shortness of breath. EXAM: CHEST - 2 VIEW COMPARISON:  June 18, 2020. FINDINGS: Stable cardiomediastinal silhouette.  Status post coronary bypass graft. No pneumothorax or pleural effusion is noted. Lungs are clear. Bony thorax is unremarkable. IMPRESSION: No active cardiopulmonary disease. Electronically Signed   By: Marijo Conception M.D.   On: 10/04/2020 13:58   CT Head Wo Contrast  Result Date: 10/04/2020 CLINICAL DATA:  Dizziness EXAM: CT HEAD WITHOUT CONTRAST TECHNIQUE: Contiguous axial images were obtained from the base of the skull through the vertex without intravenous contrast. COMPARISON:  September 23, 2011 FINDINGS: Brain: There is age related volume loss. There is no intracranial mass, hemorrhage, extra-axial fluid collection, or midline shift. There is patchy small vessel disease in the centra semiovale bilaterally, progressed from the 2013 study. No acute infarct evident. Vascular: No hyperdense vessel. There is calcification in each carotid siphon region. Skull: Bony calvarium appears intact. Sinuses/Orbits: There is a small retention cyst in the inferolateral right maxillary antrum. There is mucosal thickening and opacification in several ethmoid air cells. Orbits appear symmetric bilaterally. Other: Mastoid air cells are clear. IMPRESSION: Age related volume loss with patchy periventricular small vessel disease. No acute infarct. No mass or hemorrhage. There are foci of arterial vascular calcification. Areas of paranasal sinus disease noted.  Electronically Signed   By: Lowella Grip III M.D.   On: 10/04/2020 15:09   CT Cervical Spine Wo Contrast  Result Date: 10/04/2020 CLINICAL DATA:  Fall.  Rule out fracture EXAM: CT CERVICAL SPINE WITHOUT CONTRAST TECHNIQUE: Multidetector CT imaging of the cervical spine was performed without intravenous contrast. Multiplanar CT image reconstructions were also generated. COMPARISON:  MRI cervical spine 03/27/2013 FINDINGS: Alignment: Normal Skull base and vertebrae: Negative for fracture Soft tissues and spinal canal: Negative for mass or soft tissue swelling.  Atherosclerotic calcification in the carotid artery bilaterally. Disc levels: Disc degeneration and spurring C4-5, C5-6, C6-7. Mild spinal stenosis C5-6 and C6-7. Upper chest: Not imaged Other: None IMPRESSION: Negative for cervical spine fracture Electronically Signed   By: Franchot Gallo M.D.   On: 10/04/2020 15:13    Procedures Procedures   Medications Ordered in ED Medications - No data to display  ED Course  I have reviewed the triage vital signs and the nursing notes.  Pertinent labs & imaging results that were available during my care of the patient were reviewed by me and considered in my medical decision making (see chart for details).    MDM Rules/Calculators/A&P                          75 y/o M presenting for eval of ataxia/numbness/weakness and shortness of breath on exertion  Ataxia/numbness/weakness: no ataxia observed here in the ED but he does appear somewhat weaker on the left. CT head reviewed/interpreted  Age related volume loss with patchy periventricular small vessel disease. No acute infarct. No mass or hemorrhage. There are foci of arterial vascular calcification. Areas of paranasal sinus disease noted. CT cervical spine - Negative for cervical spine fracture. Given pts new weakness and reports of ataxia, I feel he needs further w/u with an MRI to r/o posterior circulation cva.  Shortness of breath: lungs ctab, no peripheral edema. Labs remarkable for mildly elevated BNP. Renal impairment is at baseline. Mag wnl.  EKG with sinus brady with 1st degree av block with nonspecific ivcd. CXR reviewed/interpreted and is unremarkable, trops are negative. Pt is very high risk and I am concerned that his doe may be an unstable anginal equivalent. [place] he will need admission for further monitoring and possible echo/stress.   Dr Ralene Bathe personally evaluated the pt and agrees with the plan for admission.   5:22 PM CONSULT with Dr. Roosevelt Locks who accepts patient for admission to [facility].   Final Clinical Impression(s) / ED Diagnoses Final diagnoses:  Left-sided weakness  Dyspnea on exertion    Rx / DC Orders ED Discharge Orders    None       Bishop Dublin 10/04/20 1748    Quintella Reichert, MD 10/05/20 0000

## 2020-10-05 ENCOUNTER — Inpatient Hospital Stay (HOSPITAL_COMMUNITY): Payer: No Typology Code available for payment source

## 2020-10-05 ENCOUNTER — Encounter (HOSPITAL_COMMUNITY): Payer: Self-pay | Admitting: Internal Medicine

## 2020-10-05 DIAGNOSIS — I5032 Chronic diastolic (congestive) heart failure: Secondary | ICD-10-CM

## 2020-10-05 DIAGNOSIS — I5031 Acute diastolic (congestive) heart failure: Secondary | ICD-10-CM | POA: Diagnosis not present

## 2020-10-05 DIAGNOSIS — I509 Heart failure, unspecified: Secondary | ICD-10-CM | POA: Diagnosis not present

## 2020-10-05 DIAGNOSIS — R0600 Dyspnea, unspecified: Secondary | ICD-10-CM | POA: Diagnosis present

## 2020-10-05 LAB — VITAMIN B12: Vitamin B-12: 385 pg/mL (ref 180–914)

## 2020-10-05 LAB — HEMOGLOBIN A1C
Hgb A1c MFr Bld: 6 % — ABNORMAL HIGH (ref 4.8–5.6)
Mean Plasma Glucose: 125.5 mg/dL

## 2020-10-05 LAB — LIPID PANEL
Cholesterol: 119 mg/dL (ref 0–200)
HDL: 43 mg/dL (ref >40)
LDL Cholesterol: 58 mg/dL (ref 0–99)
Total CHOL/HDL Ratio: 2.8 RATIO
Triglycerides: 91 mg/dL (ref <150)
VLDL: 18 mg/dL (ref 0–40)

## 2020-10-05 LAB — TSH: TSH: 2.955 u[IU]/mL (ref 0.350–4.500)

## 2020-10-05 LAB — BASIC METABOLIC PANEL
Anion gap: 12 (ref 5–15)
BUN: 29 mg/dL — ABNORMAL HIGH (ref 8–23)
CO2: 23 mmol/L (ref 22–32)
Calcium: 9.6 mg/dL (ref 8.9–10.3)
Chloride: 103 mmol/L (ref 98–111)
Creatinine, Ser: 3.44 mg/dL — ABNORMAL HIGH (ref 0.61–1.24)
GFR, Estimated: 18 mL/min — ABNORMAL LOW (ref >60)
Glucose, Bld: 76 mg/dL (ref 70–99)
Potassium: 3.5 mmol/L (ref 3.5–5.1)
Sodium: 138 mmol/L (ref 135–145)

## 2020-10-05 LAB — ECHOCARDIOGRAM COMPLETE
Area-P 1/2: 1.68 cm2
Height: 70 in
MV VTI: 1.26 cm2
S' Lateral: 3.9 cm
Single Plane A2C EF: 48.4 %
Weight: 3093.49 oz

## 2020-10-05 LAB — RESP PANEL BY RT-PCR (FLU A&B, COVID) ARPGX2
Influenza A by PCR: NEGATIVE
Influenza B by PCR: NEGATIVE
SARS Coronavirus 2 by RT PCR: NEGATIVE

## 2020-10-05 LAB — GLUCOSE, CAPILLARY
Glucose-Capillary: 73 mg/dL (ref 70–99)
Glucose-Capillary: 79 mg/dL (ref 70–99)

## 2020-10-05 MED ORDER — TRAZODONE HCL 100 MG PO TABS: 100.0000 mg | ORAL_TABLET | Freq: Every day | ORAL | Status: DC

## 2020-10-05 MED ORDER — FUROSEMIDE 40 MG PO TABS: 40.0000 mg | ORAL_TABLET | Freq: Every day | ORAL | Status: DC

## 2020-10-05 MED ORDER — PANTOPRAZOLE SODIUM 40 MG PO TBEC
40.0000 mg | DELAYED_RELEASE_TABLET | Freq: Every day | ORAL | Status: DC
  Filled 2020-10-05: qty 1

## 2020-10-05 MED ORDER — CALCITRIOL 0.25 MCG PO CAPS: 0.2500 ug | ORAL_CAPSULE | Freq: Every day | ORAL | Status: DC

## 2020-10-05 MED ORDER — ASCORBIC ACID 500 MG PO TABS
1000.0000 mg | ORAL_TABLET | Freq: Every day | ORAL | Status: DC
  Administered 2020-10-05: 1000 mg via ORAL
  Filled 2020-10-05: qty 2

## 2020-10-05 MED ORDER — AMIODARONE HCL 200 MG PO TABS
200.0000 mg | ORAL_TABLET | Freq: Every day | ORAL | Status: DC
  Administered 2020-10-05: 200 mg via ORAL
  Filled 2020-10-05: qty 1

## 2020-10-05 MED ORDER — POTASSIUM CHLORIDE CRYS ER 20 MEQ PO TBCR
20.0000 meq | EXTENDED_RELEASE_TABLET | Freq: Every day | ORAL | Status: DC
  Administered 2020-10-05: 20 meq via ORAL
  Filled 2020-10-05: qty 1

## 2020-10-05 MED ORDER — COLCHICINE 0.6 MG PO TABS
0.6000 mg | ORAL_TABLET | Freq: Two times a day (BID) | ORAL | Status: DC | PRN
Start: 2020-10-05 — End: 2020-10-05

## 2020-10-05 MED ORDER — SIMETHICONE 80 MG PO CHEW
80.0000 mg | CHEWABLE_TABLET | Freq: Four times a day (QID) | ORAL | Status: DC | PRN
  Administered 2020-10-05: 80 mg via ORAL
  Filled 2020-10-05: qty 1

## 2020-10-05 MED ORDER — SEMAGLUTIDE(0.25 OR 0.5MG/DOS) 2 MG/1.5ML ~~LOC~~ SOPN: 0.5000 mg | PEN_INJECTOR | SUBCUTANEOUS | Status: DC

## 2020-10-05 MED ORDER — GLIPIZIDE 10 MG PO TABS
5.0000 mg | ORAL_TABLET | Freq: Every day | ORAL | Status: DC
  Administered 2020-10-05: 5 mg via ORAL
  Filled 2020-10-05: qty 1

## 2020-10-05 MED ORDER — LORATADINE 10 MG PO TABS
10.0000 mg | ORAL_TABLET | Freq: Every day | ORAL | Status: DC
  Administered 2020-10-05: 10 mg via ORAL
  Filled 2020-10-05: qty 1

## 2020-10-05 MED ORDER — PANTOPRAZOLE SODIUM 40 MG PO TBEC
40.0000 mg | DELAYED_RELEASE_TABLET | Freq: Every day | ORAL | Status: DC | PRN
  Administered 2020-10-05 (×2): 40 mg via ORAL
  Filled 2020-10-05: qty 1

## 2020-10-05 MED ORDER — NITROGLYCERIN 0.4 MG SL SUBL: 0.4000 mg | SUBLINGUAL_TABLET | SUBLINGUAL | Status: DC | PRN

## 2020-10-05 NOTE — Progress Notes (Signed)
  Echocardiogram 2D Echocardiogram has been performed.  [name] 10/05/2020, 1:42 PM

## 2020-10-05 NOTE — Care Management Obs Status (Signed)
[place] NOTIFICATION   Patient Details  Name: [name] MRN: [id number] Date of Birth: 1945/11/28   Medicare Observation Status Notification Given:  Yes    [name], RN 10/05/2020, 4:14 PM

## 2020-10-05 NOTE — Care Management Obs Status (Signed)
[place] NOTIFICATION   Patient Details  Name: [name] MRN: [id number] Date of Birth: 1945/10/10   Medicare Observation Status Notification Given:  Yes    [name], RN 10/05/2020, 3:59 PM

## 2020-10-05 NOTE — TOC Transition Note (Signed)
Transition of Care [facility]) - CM/SW Discharge Note   Patient Details  Name: [name] MRN: [id number] Date of Birth: [birth year]  [facility] CM/SW Contact:  [name], RN Phone Number: [phone] 10/05/2020, 4:28 PM   Clinical Narrative:   Spoke with patient and spouse. No identifiable transition needs.    Final next level of care: Home/Self Care Barriers to Discharge: No Barriers Identified   Patient Goals and CMS Choice Patient states their goals for this hospitalization and ongoing recovery are:: return home CMS Medicare.gov Compare Post Acute Care list provided to:: Patient    Discharge Placement                       Discharge Plan and Services                  DME Agency: NA                  Social Determinants of Health (SDOH) Interventions     Readmission Risk Interventions No flowsheet data found.   Marthenia Rolling, MSN, RN,BSN Inpatient [facility] Case Manager [phone]

## 2020-10-05 NOTE — Care Management CC44 (Addendum)
Condition Code 44 Documentation Completed  Patient Details  Name: [name] MRN: [id number] Date of Birth: 1945-10-20   Condition Code 44 given:    Patient signature on Condition Code 44 notice:    Documentation of 2 MD's agreement:    Code 44 added to claim:       [name], RN 10/05/2020, 3:48 PM

## 2020-10-05 NOTE — Care Management CC44 (Signed)
Condition Code 44 Documentation Completed  Patient Details  Name: [name] MRN: [id number] Date of Birth: 1945-08-28   Condition Code 44 given:  Yes Patient signature on Condition Code 44 notice:  Yes Documentation of 2 MD's agreement:  Yes Code 44 added to claim:  Yes    [name], RN 10/05/2020, 4:15 PM

## 2020-10-05 NOTE — Consult Note (Addendum)
Cardiology Consultation:   Patient ID: [name] MRN: [id number]; DOB: 1945/11/25  Admit date: 10/04/2020 Date of Consult: 10/05/2020  PCP:  [name], [place]  Cardiologist:  No primary care provider on file. Advanced Practice Provider:  No care team member to display Electrophysiologist:  Thompson Grayer, MD  EP APP: [name] NP Advanced Heart Failure Clinic:  [name], MD        Patient Profile:   [name] is a 75 y.o. male with a hx of CAD s/p CABG, obesity s/p lap banding, MVR, PAF, HLD, DM II, CKD stage IV, COVID 9/21, OSA on CPAP, chronic diastolic CHF and h/o TIA who is being seen today for the evaluation of DOE at the request of Dr. Tyrell Antonio.  History of Present Illness:   [name] is a 75 year old veteran with past medical history of CAD s/p CABG, obesity s/p lap banding, MVR, PAF, HLD, DM II, CKD stage IV, COVID 9/21, OSA on CPAP, chronic diastolic CHF and h/o TIA.  He had cardiac catheterization in January 2014 that showed patent graft, severe stenosis in distal D1 not amenable to PCI.  Repeat cardiac catheterization in June 2017 shows the same coronary anatomy.  At the time, he had increased exertional dyspnea.  TTE revealed severe MR with prolapse of anterior mitral valve leaflet.  He eventually underwent mitral valve repair in August 2017.  Last cardiac catheterization in September 2018 continue to show stable coronary anatomy.  He developed atrial fibrillation and atrial flutter in December 2018 and underwent DCCV.  Due to recurrence of atrial fibrillation, amiodarone was added and she underwent repeated DCCV on January 2019.  In June 2019, he had unexplained AKI with creatinine went up to 3.46.  Renal ultrasound showed normal kidneys.  The cause of AKI was never identified.  Lasix was initiated taken off however restarted by nephrology service.  Unfortunately his kidney function was never recovered and remained in  stage IV chronic kidney disease since then.  He had myalgia associated with pravastatin and was started on PCSK9 inhibitor.  Myoview in November 2019 showed old inferior MI with no ischemia.  Echocardiogram performed around the same time showed EF 50 to 55%, mildly decreased RV systolic function, stable mitral valve repair.  He had repeat cardioversion in January and February 2021.  Due to bradycardia and hypotension, carvedilol was stopped.  His most recent visit with Dr. Aundra Dubin was on 06/06/2020.  His weight at the time was 194.2 pounds.  Unfortunately, he had COVID-19 infection October 2021, despite monoclonal antibody treatment, he continued her dyspnea on exertion.  In the past month, his wife is noticing some left-sided weakness.  For the past week, he feels his dyspnea on exam exertion has been getting worse.  He says he has been compliant with his diuretic and CPAP therapy.  He has not no lower extremity edema, orthopnea or PND.  He has not been getting much sleep lately due to flashbacks from PTSD related to combat.  Patient was admitted to [facility] on 10/04/2021 to evaluate the left-sided weakness and worsening dyspnea on exertion.  Initial BNP was 120.8 which is actually lower than 6 months ago.  Creatinine was 3.54.  White blood cell count and hemoglobin normal.  Urinalysis does not show any obvious sign of UTI.  Head CT showed no sign of acute infarct.  EKG showed a sinus bradycardia with left bundle branch block.  Chest x-ray clear.  MRI of the brain showed small vessel ischemic changes of the cerebral hemisphere, no acute finding, old small vessel infarction in the right cerebellum, old small right parietal occipital cortical infarction.   Past Medical History:  Diagnosis Date  . Allergic rhinitis   . Anxiety   . Atrial fibrillation ([place])   . Cancer ([place])    hx of skin cancers   . Chronic kidney disease   . Coronary artery disease    a. s/p MI & CABG; b. s/p PCI Diag;  c. Cath  12/2011: LAD diff dzs/small, Diag 75% isr, 90 dist to stent (small), LCX & RCA occluded, VG->OM 40, VG->PDA patent - med rx.  . Depression    PTSD  . Diabetes mellitus    not on medications  . Diastolic CHF, chronic (HCC)    a. EF 55-60% by echo 2007  . Dysrhythmia    "abnormal beat"  . GERD (gastroesophageal reflux disease)    "not anymore" " I had a lap band"  . History of diverticulitis of colon    5 YRS AGO  . History of pneumonia    2017  . History of transient ischemic attack (TIA)    CAROTID DOPPLERS NOV 2011  0-39& BIL. STENOSIS  . Hyperlipidemia   . Hypertension   . Mitral regurgitation   . Myocardial infarction ([place])   . Neuromuscular disorder ([place])    left arm numbness   . 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 PTSD (post-traumatic stress disorder)    from [place]  . 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 Shortness of breath dyspnea    with exertion  . Sleep apnea    cpap- see ov note in [facility] 05/12/11 for settings     Past Surgical History:  Procedure Laterality Date  . AV FISTULA PLACEMENT Left 08/02/2018   Procedure: ARTERIOVENOUS (AV) FISTULA CREATION RADIOCEPHALIC;  Surgeon: Angelia Mould, MD;  Location: [place];  Service: Vascular;  Laterality: Left;  . BLEPHAROPLASTY  1985   BILATERAL  . CARDIAC CATHETERIZATION  2001, 2004, 2009, 2011  . CARDIAC CATHETERIZATION N/A 01/23/2016   Procedure: Right/Left Heart Cath and Coronary Angiography;  Surgeon: [name], MD;  Location: [place] CV LAB;  Service: Cardiovascular;  Laterality: N/A;  . CARDIOVERSION N/A 09/07/2017   Procedure: CARDIOVERSION;  Surgeon: [name], MD;  Location: [facility] ENDOSCOPY;  Service: Cardiovascular;  Laterality: N/A;  .  CARDIOVERSION N/A 09/13/2019   Procedure: CARDIOVERSION;  Surgeon: [name], MD;  Location: [facility] ENDOSCOPY;  Service: Cardiovascular;  Laterality: N/A;  . CARDIOVERSION N/A 10/19/2019   Procedure: CARDIOVERSION;  Surgeon: [name], MD;  Location[facility] ENDOSCOPY;  Service: Cardiovascular;  Laterality: N/A;  . CATARACT EXTRACTION W/ INTRAOCULAR LENS IMPLANT Bilateral   . CHONDROPLASTY  07/22/2011   Procedure: CHONDROPLASTY;  Surgeon: Dione Plover Aluisio;  Location: [place];  Service: Orthopedics;;  . CIRCUMCISION  35 YRS  AGO  . COLONOSCOPY    . CORONARY ANGIOPLASTY  1996-- POST CABG   W/ STENT, last cath 01/07/2012   . CORONARY ARTERY BYPASS GRAFT  1995   X3 VESSEL  . CORONARY STENT PLACEMENT    . KNEE ARTHROSCOPY  07/22/2011   Procedure: ARTHROSCOPY KNEE;  Surgeon: Dione Plover  Aluisio;  Location: [place];  Service: Orthopedics;  Laterality: Right;  WITH DEBRIDEMENT   . KNEE ARTHROSCOPY W/ MENISCECTOMY  X2 IN 2002-- RIGHT KNEE  . LAPAROSCOPIC GASTRIC BANDING  01-15-09  . LEFT HEART CATH AND CORONARY ANGIOGRAPHY N/A 04/29/2017   Procedure: LEFT HEART CATH AND CORONARY ANGIOGRAPHY;  Surgeon: [name], MD;  Location: [place] CV LAB;  Service: Cardiovascular;  Laterality: N/A;  . LEFT HEART CATHETERIZATION WITH CORONARY ANGIOGRAM N/A 09/11/2013   Procedure: LEFT HEART CATHETERIZATION WITH CORONARY ANGIOGRAM;  Surgeon: [name], MD;  Location: [facility] CATH LAB;  Service: Cardiovascular;  Laterality: N/A;  . MITRAL VALVE REPAIR Right 03/25/2016   Procedure: MINIMALLY INVASIVE REOPERATION FOR MITRAL VALVE REPAIR  (MVR) with size 26 Sorin Memo 3D;  Surgeon: [name], MD;  Location: [place];  Service: Open Heart Surgery;  Laterality: Right;  . RIGHT KNEE ED COMPARTMENT REPLACEMENT  2004  . SYNOVECTOMY  07/22/2011   Procedure: SYNOVECTOMY;  Surgeon: [name];  Location: [place];  Service: Orthopedics;;  . TEE WITHOUT  CARDIOVERSION N/A 01/23/2016   Procedure: TRANSESOPHAGEAL ECHOCARDIOGRAM (TEE);  Surgeon: [name], MD;  Location: [place];  Service: Cardiovascular;  Laterality: N/A;  . TEE WITHOUT CARDIOVERSION N/A 03/25/2016   Procedure: TRANSESOPHAGEAL ECHOCARDIOGRAM (TEE);  Surgeon: [name], MD;  Location: [place];  Service: Open Heart Surgery;  Laterality: N/A;  . UMBILICAL HERNIA REPAIR  01-15-09   W/ GASTRIC BANDING PROCEDURE     Home Medications:  Prior to Admission medications   Medication Sig Start Date End Date Taking? Authorizing Provider  Alirocumab (PRALUENT) 75 MG/ML SOAJ Inject 75 mg into the skin every 14 (fourteen) days.   Yes [provider]  allopurinol (ZYLOPRIM) 100 MG tablet Take 100 mg by mouth in the morning and at bedtime.  07/03/19  Yes [provider]  amiodarone (PACERONE) 200 MG tablet Take 1 tablet (200 mg total) by mouth daily. 10/19/19  Yes [name], MD  apixaban (ELIQUIS) 5 MG TABS tablet Take 1 tablet (5 mg total) by mouth 2 (two) times daily. 09/09/17  Yes [name], MD  Ascorbic Acid (VITAMIN C WITH ROSE HIPS) 1000 MG tablet Take 1,000 mg by mouth daily.   Yes [provider]  calcitRIOL (ROCALTROL) 0.25 MCG capsule Take 0.25 mcg by mouth at bedtime.    Yes [provider]  cetirizine (ZYRTEC) 10 MG tablet Take 10 mg by mouth daily.   Yes [provider]  furosemide (LASIX) 40 MG tablet Take 40 mg by mouth daily.   Yes [provider]  glipiZIDE (GLUCOTROL) 5 MG tablet TAKE 1 TABLET TWICE A DAY BEFORE MEALS 03/08/20  Yes Philemon Kingdom, MD  potassium chloride SA (K-DUR,KLOR-CON) 20 MEQ tablet Take 20 mEq by mouth daily.   Yes [provider]  Semaglutide,0.25 or 0.5MG /DOS, (OZEMPIC, 0.25 OR 0.5 MG/DOSE,) 2 MG/1.5ML SOPN Inject 0.375 mLs (0.5 mg total) into the skin every Thursday. 02/01/20  Yes Philemon Kingdom, MD  traZODone (DESYREL) 100 MG tablet Take 100 mg by mouth at bedtime.    Yes [provider]  Artificial Saliva (MOUTH KOTE MT) Use as directed 4 sprays in the mouth or throat every 3 (three) hours as needed (for dry mouth).    [provider]  colchicine 0.6 MG tablet Take 0.6-1.2 mg by mouth 2 (two) times daily as needed (gout).  04/14/19   [provider]  nitroGLYCERIN (NITROSTAT)  0.4 MG SL tablet 1 TAB UNDER THE TONGUE EVERY 5 MINUTES AS NEEDED FOR CHEST PAIN UP TO 3 DOSES, IF PERSIST CALL 911 03/05/20   [name], MD  tadalafil (CIALIS) 20 MG tablet TAKE 1 TABLET BY MOUTH ONCE DAILY AS NEEDED 01/08/20   Alyson Ingles Candee Furbish, MD    Inpatient Medications: Scheduled Meds: . amiodarone  200 mg Oral Daily  . apixaban  5 mg Oral BID  . vitamin C with rose hips  1,000 mg Oral Daily  . calcitRIOL  0.25 mcg Oral QHS  . furosemide  20 mg Intravenous Daily  . glipiZIDE  5 mg Oral QAC breakfast  . insulin aspart  0-20 Units Subcutaneous TID WC  . insulin aspart  0-5 Units Subcutaneous QHS  . loratadine  10 mg Oral Daily  . pantoprazole  40 mg Oral Daily  . potassium chloride SA  20 mEq Oral Daily  . [START ON 10/10/2020] Semaglutide(0.25 or 0.5MG /DOS)  0.5 mg Subcutaneous Q Thu  . sodium chloride flush  3 mL Intravenous Q12H  . traZODone  100 mg Oral QHS   Continuous Infusions: . sodium chloride     PRN Meds: sodium chloride, acetaminophen, colchicine, nitroGLYCERIN, ondansetron (ZOFRAN) IV, pantoprazole, simethicone, sodium chloride flush  Allergies:    Allergies  Allergen Reactions  . Crestor [Rosuvastatin] Other (See Comments)    Muscle aches   . Lipitor [Atorvastatin] Other (See Comments)    Muscle aches  . Pravastatin Other (See Comments)    Muscle aches  . Codeine Itching and Other (See Comments)    Extremity tingling-- can take synthetic    Social History:   Social History   Socioeconomic History  . Marital status: Married    Spouse name: Not on file  . Number of children: Not on file  . Years of education:  Not on file  . Highest education level: Not on file  Occupational History  . Not on file  Tobacco Use  . Smoking status: Never Smoker  . Smokeless tobacco: Never Used  Vaping Use  . Vaping Use: Never used  Substance and Sexual Activity  . Alcohol use: Not Currently    Alcohol/week: 2.0 standard drinks    Types: 1 Glasses of wine, 1 Shots of liquor per week    Comment: OCCASIONAL  . Drug use: No  . Sexual activity: Not on file  Other Topics Concern  . Not on file  Social History Narrative  . Not on file   Social Determinants of Health   Financial Resource Strain: Not on file  Food Insecurity: Not on file  Transportation Needs: Not on file  Physical Activity: Not on file  Stress: Not on file  Social Connections: Not on file  Intimate Partner Violence: Not on file    Family History:    Family History  Problem Relation Age of Onset  . Other Mother        joint problems  . Hypertension Father   . Heart disease Father   . CVA Father   . Depression Father   . Heart disease Sister        CABG  . Stroke Sister   . Hypertension Sister   . Congestive Heart Failure Sister   . Diabetes Sister   . Heart disease Brother   . Hypertension Brother   . Congestive Heart Failure Brother      ROS:  Please see the history of present illness.   All other ROS reviewed and  negative.     Physical Exam/Data:   Vitals:   10/04/20 2147 10/05/20 0010 10/05/20 0401 10/05/20 0932  BP: (!) 156/76 (!) 155/81 131/70 129/76  Pulse: (!) 58  (!) 57   Resp: 16  16   Temp: 98 F (36.7 C) 98.3 F (36.8 C) 98.2 F (36.8 C)   TempSrc: Oral Oral Oral   SpO2: 98%  100%   Weight:   87.7 kg   Height:        Intake/Output Summary (Last 24 hours) at 10/05/2020 1417 Last data filed at 10/05/2020 0947 Gross per 24 hour  Intake 6 ml  Output 1080 ml  Net -1074 ml   Last 3 Weights 10/05/2020 10/04/2020 10/04/2020  Weight (lbs) 193 lb 5.5 oz 193 lb 5.5 oz 190 lb  Weight (kg) 87.7 kg 87.7 kg  86.183 kg     Body mass index is 27.74 kg/m.  General:  Well nourished, well developed, in no acute distress HEENT: normal Lymph: no adenopathy Neck: no JVD Endocrine:  No thryomegaly Vascular: No carotid bruits; FA pulses 2+ bilaterally without bruits  Cardiac:  normal S1, S2; RRR; no murmur  Lungs:  clear to auscultation bilaterally, no wheezing, rhonchi or rales  Abd: soft, nontender, no hepatomegaly  Ext: no edema Musculoskeletal:  No deformities, BUE and BLE strength normal and equal Skin: warm and dry  Neuro:  CNs 2-12 intact, no focal abnormalities noted Psych:  Normal affect   EKG:  The EKG was personally reviewed and demonstrates: Sinus rhythm, left bundle branch block, nonspecific ST-T wave changes Telemetry:  Telemetry was personally reviewed and demonstrates: Sinus bradycardia, no recurrence of atrial fibrillation during this admission.  Relevant CV Studies:  Echo 08/07/2019 1. Left ventricular ejection fraction, by visual estimation, is 50 to  55%. The left ventricle has normal function. There is mildly increased  left ventricular hypertrophy.  2. Left ventricular diastolic parameters are consistent with Grade II  diastolic dysfunction (pseudonormalization).  3. The left ventricle has no regional wall motion abnormalities.  4. Global right ventricle has mildly reduced systolic function.The right  ventricular size is moderately enlarged. No increase in right ventricular  wall thickness.  5. Left atrial size was severely dilated.  6. Right atrial size was moderately dilated.  7. Moderate calcification of the mitral valve leaflet(s).  8. Moderate mitral annular calcification.  9. Moderate thickening of the mitral valve leaflet(s).  10. The mitral valve is normal in structure. Mild mitral valve  regurgitation. Moderate mitral stenosis.  11. The tricuspid valve is normal in structure. Tricuspid valve  regurgitation is mild.  12. The aortic valve is normal  in structure. Aortic valve regurgitation is  not visualized. Mild to moderate aortic valve sclerosis/calcification  without any evidence of aortic stenosis.  13. The pulmonic valve was normal in structure. Pulmonic valve  regurgitation is not visualized.  14. Moderately elevated pulmonary artery systolic pressure.  15. The tricuspid regurgitant velocity is 2.62 m/s, and with an assumed  right atrial pressure of 15 mmHg, the estimated right ventricular systolic  pressure is moderately elevated at 42.5 mmHg.  16. The inferior vena cava is dilated in size with <50% respiratory  variability, suggesting right atrial pressure of 15 mmHg.   Laboratory Data:  High Sensitivity Troponin:   Recent Labs  Lab 10/04/20 1331 10/04/20 1603  TROPONINIHS 13 13     Chemistry Recent Labs  Lab 10/04/20 1331 10/05/20 0310  NA 134* 138  K 3.6 3.5  CL 101  103  CO2 24 23  GLUCOSE 113* 76  BUN 33* 29*  CREATININE 3.54* 3.44*  CALCIUM 9.6 9.6  GFRNONAA 17* 18*  ANIONGAP 9 12    No results for input(s): PROT, ALBUMIN, AST, ALT, ALKPHOS, BILITOT in the last 168 hours. Hematology Recent Labs  Lab 10/04/20 1331  WBC 6.4  RBC 4.49  HGB 14.7  HCT 43.3  MCV 96.4  MCH 32.7  MCHC 33.9  RDW 13.4  PLT 160   BNP Recent Labs  Lab 10/04/20 1331  BNP 120.8*    DDimer No results for input(s): DDIMER in the last 168 hours.   Radiology/Studies:  DG Chest 2 View  Result Date: 10/04/2020 CLINICAL DATA:  Shortness of breath. EXAM: CHEST - 2 VIEW COMPARISON:  June 18, 2020. FINDINGS: Stable cardiomediastinal silhouette. Status post coronary bypass graft. No pneumothorax or pleural effusion is noted. Lungs are clear. Bony thorax is unremarkable. IMPRESSION: No active cardiopulmonary disease. Electronically Signed   By: Marijo Conception M.D.   On: 10/04/2020 13:58   CT Head Wo Contrast  Result Date: 10/04/2020 CLINICAL DATA:  Dizziness EXAM: CT HEAD WITHOUT CONTRAST TECHNIQUE: Contiguous axial  images were obtained from the base of the skull through the vertex without intravenous contrast. COMPARISON:  September 23, 2011 FINDINGS: Brain: There is age related volume loss. There is no intracranial mass, hemorrhage, extra-axial fluid collection, or midline shift. There is patchy small vessel disease in the centra semiovale bilaterally, progressed from the 2013 study. No acute infarct evident. Vascular: No hyperdense vessel. There is calcification in each carotid siphon region. Skull: Bony calvarium appears intact. Sinuses/Orbits: There is a small retention cyst in the inferolateral right maxillary antrum. There is mucosal thickening and opacification in several ethmoid air cells. Orbits appear symmetric bilaterally. Other: Mastoid air cells are clear. IMPRESSION: Age related volume loss with patchy periventricular small vessel disease. No acute infarct. No mass or hemorrhage. There are foci of arterial vascular calcification. Areas of paranasal sinus disease noted. Electronically Signed   By: Lowella Grip III M.D.   On: 10/04/2020 15:09   CT Cervical Spine Wo Contrast  Result Date: 10/04/2020 CLINICAL DATA:  Fall.  Rule out fracture EXAM: CT CERVICAL SPINE WITHOUT CONTRAST TECHNIQUE: Multidetector CT imaging of the cervical spine was performed without intravenous contrast. Multiplanar CT image reconstructions were also generated. COMPARISON:  MRI cervical spine 03/27/2013 FINDINGS: Alignment: Normal Skull base and vertebrae: Negative for fracture Soft tissues and spinal canal: Negative for mass or soft tissue swelling. Atherosclerotic calcification in the carotid artery bilaterally. Disc levels: Disc degeneration and spurring C4-5, C5-6, C6-7. Mild spinal stenosis C5-6 and C6-7. Upper chest: Not imaged Other: None IMPRESSION: Negative for cervical spine fracture Electronically Signed   By: Franchot Gallo M.D.   On: 10/04/2020 15:13   MR BRAIN WO CONTRAST  Result Date: 10/05/2020 CLINICAL DATA:   Left-sided weakness. EXAM: MRI HEAD WITHOUT CONTRAST TECHNIQUE: Multiplanar, multiecho pulse sequences of the brain and surrounding structures were obtained without intravenous contrast. COMPARISON:  Head CT yesterday. FINDINGS: Brain: Diffusion imaging does not show any acute or subacute infarction. The brainstem is normal. Old small vessel infarction in the right cerebellum. Cerebral hemispheres show moderate chronic small-vessel ischemic changes of the deep and subcortical white matter. There is an old small right parietooccipital cortical infarction. No large vessel territory infarction. No mass lesion, hemorrhage, hydrocephalus or extra-axial collection. Vascular: Major vessels at the base of the brain show flow. Skull and upper cervical spine: Negative Sinuses/Orbits:  Clear/normal Other: None IMPRESSION: No acute finding by MRI. Moderate chronic small-vessel ischemic changes of the cerebral hemispheric white matter. 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 Old small right parietooccipital cortical infarction. Electronically Signed   By: Nelson Chimes M.D.   On: 10/05/2020 01:49     Assessment and Plan:   1. Dyspnea on exertion: Dyspnea on exertion has been going on since the COVID-19 infection in the beginning of October 2021.  He did undergo monoclonal antibody infusion.  In the past week, he has noticed slightly increased dyspnea on exertion.  On exam, he is very much euvolemic without any lower extremity edema, his lung is clear, there is no JVD.  In fact he is weight is 2 pounds lower than his dry weight when he was seen by Dr. Algernon Huxley in the office he October 2021.  Suspicion for volume overload contributing to dyspnea on exertion is very low at this time.   - He denies any obvious angina recently associated with dyspnea on exertion.    - Although patient previously had a similar symptom associated with recurrence of A. fib, during this admission he has been maintaining sinus  rhythm.   2. Left-sided weakness: First noticed by his wife, he suspect this has been going on for several weeks.  Unfortunately patient cannot recall the first time he noticed the left-sided weakness.  Therefore there is some degree of questionable chronicity related to the onset of the weakness.  The weakness is noticeable by physical exam.  MRI of the brain shows some small old right-sided infarct, however nothing acute.  3. CAD s/p CABG: No recent chest discomfort.  No longer on aspirin given the need for Eliquis  4. Recurrent PAF: Last cardioversion February 2021.  Maintaining sinus bradycardia on no AV nodal blocking agent at home.  Previously taken off of carvedilol due to bradycardia and hypotension.  Continue amiodarone 200 mg daily  5. Chronic diastolic heart failure: On 40 mg daily of Lasix and 20 mEq daily of potassium chloride at home.  Appears to be euvolemic on exam  6. History of MVR: Stable on last echocardiogram, pending repeat echocardiogram today  7. Hyperlipidemia  8. DM2  9. CKD stage IV  10. COVID-19 on 9/21: Received monoclonal antibody treatment  11. Obstructive sleep apnea on CPAP therapy: Compliant with CPAP therapy  12. History of TIA   Risk Assessment/Risk Scores:      CHA2DS2-VASc Score = 7  This indicates a 11.2% annual risk of stroke. The patient's score is based upon: CHF History: Yes HTN History: No Diabetes History: Yes Stroke History: Yes Vascular Disease History: Yes Age Score: 2 Gender Score: 0    For questions or updates, please contact [place] Please consult [email or web address] for contact info under    Signed, Almyra Deforest, [place]  10/05/2020 2:17 PM History and all data above reviewed.  The patient presented with weakness. He had left-sided weakness. He was canted drifting to the left when he walked. He came in mostly for this neurologic complaints. He has had shortness of breath. He says this has been chronic but worse since had Covid.  He is not have any cough fevers or chills. He denies any PND or orthopnea. He gets dyspneic walking a short distance on any incline in his yard. He is not describing chest pressure, neck or arm discomfort. He has not had any new palpitations, presyncope or syncope. His BNP was elevated but lower than previous. Weights have been lower than previous.  He did get an echocardiogram with an EF of 45 to 50% but this is not in my estimation significantly different than previous. He does have some moderate mitral stenosis status post surgery which is unchanged from previous. He had low normal right ventricular systolic pressure. Patient examined.  I agree with the findings as above.  The patient exam reveals COR: Regular rate and rhythm,  Lungs: Clear to auscultation bilaterally,  Abd: Positive bowel sounds normal in frequency in pitch, Ext no edema, no cyanosis, clubbing.  All available labs, radiology testing, previous records reviewed. Agree with documented assessment and plan.   Acute on chronic dyspnea: He did get IV diuretics. However, I do not think he is grossly volume overloaded and I would suggest switching him back to p.o. I do see that the ejection fraction is slightly reduced compared to previous but I do not think this is markedly different. I will not suggest further invasive or other ischemia work-up. He is not having any active symptoms. His renal insufficiency precludes significant med titration. I might suggest that his progressive dyspnea could be related to his COVID and post COVID.  He is in sinus. I do not think that atrial fibrillation is likely etiology for his complaints but we will arrange for him to have a home monitor for couple of weeks. No further inpatient work-up is suggested. Jeneen Rinks Hochrein  3:28 PM  10/05/2020

## 2020-10-05 NOTE — Discharge Summary (Signed)
Physician Discharge Summary  [name] QTM:[id number] DOB: 07-08-1946 DOA: 10/04/2020  PCP: [name], MD  Admit date: 10/04/2020 Discharge date: 10/05/2020  Admitted From: Home  Disposition:  Home   Recommendations for Outpatient Follow-up:  1. Follow up with PCP in 1-2 weeks 2. Please obtain BMP/CBC in one week 3. Needs referral to neurology for further evaluation of left side numbness.  4. Needs to follow up with cardiology for further care of dyspnea on exertion.   Home Health: none  Discharge Condition: Stable.  CODE STATUS: Full code Diet recommendation: Heart Healthy   Brief/Interim Summary: 75 year old with past medical history significant for A. fib, chronic kidney disease a stage IV, CAD, status post coronary bypass grafting, TIA, mitral valve repair, recent COVID-19 in September, diabetes, who presents complaining of left-sided weakness for the last 2 days prior to admission.  He had a fall the day prior to admission.  At the time of admission his main complaint was numbness of the left upper and lower extremity especially his hands.  No change in his speech or vision.  Patient in the ED; CT head and CT spine were negative.  Covid screening negative.  Troponin negative.  Chest x-ray no acute finding.  1-Neurological symptoms: Patient report  left side weakness, resolved at the time of admission. Neuro exam on my evaluation he was 5/5 strength.  He is still complaining of left thigh numbness on and off and hands decreased sensation. He has been on a lot of stress, he has not been able to sleep well at night.  MRI of the brain was negative for acute stroke, did show old stroke that would not explain numbness.  -Patient is already on Eliquis for stroke prevention, statins. LDL 58. HbA1c 6.0 -B 12 385, TSH 2.9 -Discussed with Dr Patrick North case, he recommend out patient neurology evaluation for left side numbness.  adding aspirin will increase risk for bleeding. Wont  start aspirin at this time.   2-Exertional Dyspnea. ECHO mildly reduce EF.  Evaluated by cardiology, symptoms might be related to post covid.   3-HLD; continue with statins.   4-Chronic A fib: Continue with eliquis.       Discharge Diagnoses:  Principal Problem:   CHF (congestive heart failure) (HCC) Active Problems:   Poorly controlled type 2 diabetes mellitus with circulatory disorder (HCC)   Hyperlipidemia   Essential hypertension   Arteriosclerosis of coronary artery   Chronic kidney disease   Hypertension   Dyspnea    Discharge Instructions  Discharge Instructions    Diet - low sodium heart healthy   Complete by: As directed    Increase activity slowly   Complete by: As directed      Allergies as of 10/05/2020      Reactions   Crestor [rosuvastatin] Other (See Comments)   Muscle aches   Lipitor [atorvastatin] Other (See Comments)   Muscle aches   Pravastatin Other (See Comments)   Muscle aches   Codeine Itching, Other (See Comments)   Extremity tingling-- can take synthetic      Medication List    TAKE these medications   allopurinol 100 MG tablet Commonly known as: ZYLOPRIM Take 100 mg by mouth in the morning and at bedtime.   amiodarone 200 MG tablet Commonly known as: PACERONE Take 1 tablet (200 mg total) by mouth daily.   apixaban 5 MG Tabs tablet Commonly known as: Eliquis Take 1 tablet (5 mg total) by mouth 2 (two) times daily.  calcitRIOL 0.25 MCG capsule Commonly known as: ROCALTROL Take 0.25 mcg by mouth at bedtime.   cetirizine 10 MG tablet Commonly known as: ZYRTEC Take 10 mg by mouth daily.   colchicine 0.6 MG tablet Take 0.6-1.2 mg by mouth 2 (two) times daily as needed (gout).   furosemide 40 MG tablet Commonly known as: LASIX Take 40 mg by mouth daily.   glipiZIDE 5 MG tablet Commonly known as: GLUCOTROL TAKE 1 TABLET TWICE A DAY BEFORE MEALS   MOUTH KOTE MT Use as directed 4 sprays in the mouth or throat every 3  (three) hours as needed (for dry mouth).   nitroGLYCERIN 0.4 MG SL tablet Commonly known as: NITROSTAT 1 TAB UNDER THE TONGUE EVERY 5 MINUTES AS NEEDED FOR CHEST PAIN UP TO 3 DOSES, IF PERSIST CALL 911   Ozempic (0.25 or 0.5 MG/DOSE) 2 MG/1.5ML Sopn Generic drug: Semaglutide(0.25 or 0.5MG /DOS) Inject 0.375 mLs (0.5 mg total) into the skin every Thursday.   potassium chloride SA 20 MEQ tablet Commonly known as: KLOR-CON Take 20 mEq by mouth daily.   Praluent 75 MG/ML Soaj Generic drug: Alirocumab Inject 75 mg into the skin every 14 (fourteen) days.   tadalafil 20 MG tablet Commonly known as: CIALIS TAKE 1 TABLET BY MOUTH ONCE DAILY AS NEEDED   traZODone 100 MG tablet Commonly known as: DESYREL Take 100 mg by mouth at bedtime.   vitamin C with rose hips 1000 MG tablet Take 1,000 mg by mouth daily.       Allergies  Allergen Reactions  . Crestor [Rosuvastatin] Other (See Comments)    Muscle aches   . Lipitor [Atorvastatin] Other (See Comments)    Muscle aches  . Pravastatin Other (See Comments)    Muscle aches  . Codeine Itching and Other (See Comments)    Extremity tingling-- can take synthetic    Consultations:  Cardiology  Neurology phone  consultation.    Procedures/Studies: DG Chest 2 View  Result Date: 10/04/2020 CLINICAL DATA:  Shortness of breath. EXAM: CHEST - 2 VIEW COMPARISON:  June 18, 2020. FINDINGS: Stable cardiomediastinal silhouette. Status post coronary bypass graft. No pneumothorax or pleural effusion is noted. Lungs are clear. Bony thorax is unremarkable. IMPRESSION: No active cardiopulmonary disease. Electronically Signed   By: Marijo Conception M.D.   On: 10/04/2020 13:58   CT Head Wo Contrast  Result Date: 10/04/2020 CLINICAL DATA:  Dizziness EXAM: CT HEAD WITHOUT CONTRAST TECHNIQUE: Contiguous axial images were obtained from the base of the skull through the vertex without intravenous contrast. COMPARISON:  September 23, 2011 FINDINGS:  Brain: There is age related volume loss. There is no intracranial mass, hemorrhage, extra-axial fluid collection, or midline shift. There is patchy small vessel disease in the centra semiovale bilaterally, progressed from the 2013 study. No acute infarct evident. Vascular: No hyperdense vessel. There is calcification in each carotid siphon region. Skull: Bony calvarium appears intact. Sinuses/Orbits: There is a small retention cyst in the inferolateral right maxillary antrum. There is mucosal thickening and opacification in several ethmoid air cells. Orbits appear symmetric bilaterally. Other: Mastoid air cells are clear. IMPRESSION: Age related volume loss with patchy periventricular small vessel disease. No acute infarct. No mass or hemorrhage. There are foci of arterial vascular calcification. Areas of paranasal sinus disease noted. Electronically Signed   By: Lowella Grip III M.D.   On: 10/04/2020 15:09   CT Cervical Spine Wo Contrast  Result Date: 10/04/2020 CLINICAL DATA:  Fall.  Rule out fracture EXAM: CT  CERVICAL SPINE WITHOUT CONTRAST TECHNIQUE: Multidetector CT imaging of the cervical spine was performed without intravenous contrast. Multiplanar CT image reconstructions were also generated. COMPARISON:  MRI cervical spine 03/27/2013 FINDINGS: Alignment: Normal Skull base and vertebrae: Negative for fracture Soft tissues and spinal canal: Negative for mass or soft tissue swelling. Atherosclerotic calcification in the carotid artery bilaterally. Disc levels: Disc degeneration and spurring C4-5, C5-6, C6-7. Mild spinal stenosis C5-6 and C6-7. Upper chest: Not imaged Other: None IMPRESSION: Negative for cervical spine fracture Electronically Signed   By: Franchot Gallo M.D.   On: 10/04/2020 15:13   MR BRAIN WO CONTRAST  Result Date: 10/05/2020 CLINICAL DATA:  Left-sided weakness. EXAM: MRI HEAD WITHOUT CONTRAST TECHNIQUE: Multiplanar, multiecho pulse sequences of the brain and surrounding  structures were obtained without intravenous contrast. COMPARISON:  Head CT yesterday. FINDINGS: Brain: Diffusion imaging does not show any acute or subacute infarction. The brainstem is normal. Old small vessel infarction in the right cerebellum. Cerebral hemispheres show moderate chronic small-vessel ischemic changes of the deep and subcortical white matter. There is an old small right parietooccipital cortical infarction. No large vessel territory infarction. No mass lesion, hemorrhage, hydrocephalus or extra-axial collection. Vascular: Major vessels at the base of the brain show flow. Skull and upper cervical spine: Negative Sinuses/Orbits: Clear/normal Other: None IMPRESSION: No acute finding by MRI. Moderate chronic small-vessel ischemic changes of the cerebral hemispheric white matter. 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 Old small right parietooccipital cortical infarction. Electronically Signed   By: Nelson Chimes M.D.   On: 10/05/2020 01:49   ECHOCARDIOGRAM COMPLETE  Result Date: 10/05/2020    ECHOCARDIOGRAM REPORT   Patient Name:   TAHEEM FRICKE [facility] Date of Exam: 10/05/2020 Medical Rec #:  [id number]      Height:       70.0 in Accession #:    [id number]     Weight:       193.3 lb Date of Birth:  05/22/1946      BSA:          2.058 m Patient Age:    29 years       BP:           129/76 mmHg Patient Gender: M              HR:           56 bpm. Exam Location:  Inpatient Procedure: 2D Echo Indications:    acute diastolic chf  History:        Patient has prior history of Echocardiogram examinations, most                 recent 08/07/2019. CAD, mitral valve repair, chronic kidney                 disease; Risk Factors:Diabetes, Sleep Apnea and Hypertension.  Sonographer:    Johny Chess Referring Phys: [place]  1. Left ventricular ejection fraction, by estimation, is 45 to 50%. The left ventricle has mildly decreased function. The left ventricle demonstrates global  hypokinesis. There is mild concentric left ventricular hypertrophy. Left ventricular diastolic parameters are indeterminate.  2. Right ventricular systolic function is low normal. The right ventricular size is moderately enlarged. There is normal pulmonary artery systolic pressure.  3. Left atrial size was severely dilated.  4. There is moderate mitral stenosis with MVA 1.26cm2 by continuity, mean gradient 16mmHg at HR 56bpm. The mitral valve has been repaired/replaced. Trivial mitral valve regurgitation. Moderate mitral  annular calcification.  5. The aortic valve is tricuspid. There is moderate calcification of the aortic valve. There is moderate thickening of the aortic valve. Aortic valve regurgitation is not visualized. Mild to moderate aortic valve sclerosis/calcification is present, without any evidence of aortic stenosis.  6. Aortic dilatation noted. There is mild dilatation of the ascending aorta, measuring 37 mm.  7. The inferior vena cava is dilated in size with >50% respiratory variability, suggesting right atrial pressure of 8 mmHg.  8. Right atrial size was mildly dilated. Comparison(s): No significant change from prior study. FINDINGS  Left Ventricle: Left ventricular ejection fraction, by estimation, is 45 to 50%. The left ventricle has mildly decreased function. The left ventricle demonstrates global hypokinesis. The left ventricular internal cavity size was normal in size. There is  mild concentric left ventricular hypertrophy. Left ventricular diastolic parameters are indeterminate. Right Ventricle: The right ventricular size is moderately enlarged. No increase in right ventricular wall thickness. Right ventricular systolic function is low normal. There is normal pulmonary artery systolic pressure. The tricuspid regurgitant velocity  is 2.62 m/s, and with an assumed right atrial pressure of 3 mmHg, the estimated right ventricular systolic pressure is 61.4 mmHg. Left Atrium: Left atrial size was  severely dilated. Right Atrium: Right atrial size was mildly dilated. Pericardium: There is no evidence of pericardial effusion. Mitral Valve: The mitral valve has been repaired/replaced. There is moderate thickening of the mitral valve leaflet(s). Moderate mitral annular calcification. Trivial mitral valve regurgitation. There is moderate mitral stenosis with MVA 1.26cm2 by continuity, mean gradient 12mmHg at HR 56bpm. MV peak gradient, 18.8 mmHg. The mean mitral valve gradient is 6.0 mmHg. Tricuspid Valve: The tricuspid valve is normal in structure. Tricuspid valve regurgitation is mild. Aortic Valve: The aortic valve is tricuspid. There is moderate calcification of the aortic valve. There is moderate thickening of the aortic valve. Aortic valve regurgitation is not visualized. Mild to moderate aortic valve sclerosis/calcification is present, without any evidence of aortic stenosis. Pulmonic Valve: The pulmonic valve was normal in structure. Pulmonic valve regurgitation is trivial. Aorta: Aortic dilatation noted. There is mild dilatation of the ascending aorta, measuring 37 mm. Venous: The inferior vena cava is dilated in size with greater than 50% respiratory variability, suggesting right atrial pressure of 8 mmHg. IAS/Shunts: No atrial level shunt detected by color flow Doppler.  LEFT VENTRICLE PLAX 2D LVIDd:         4.70 cm LVIDs:         3.90 cm LV PW:         1.20 cm LV IVS:        1.30 cm LVOT diam:     2.20 cm LV SV:         96 LV SV Index:   47 LVOT Area:     3.80 cm  LV Volumes (MOD) LV vol d, MOD A2C: 110.0 ml LV vol s, MOD A2C: 56.8 ml LV SV MOD A2C:     53.2 ml RIGHT VENTRICLE             IVC RV S prime:     12.90 cm/s  IVC diam: 1.90 cm TAPSE (M-mode): 2.2 cm LEFT ATRIUM             Index       RIGHT ATRIUM           Index LA diam:        4.30 cm 2.09 cm/m  RA Area:  22.20 cm LA Vol (A2C):   81.5 ml 39.61 ml/m RA Volume:   62.90 ml  30.57 ml/m LA Vol (A4C):   97.6 ml 47.44 ml/m LA Biplane Vol:  94.4 ml 45.88 ml/m  AORTIC VALVE LVOT Vmax:   103.00 cm/s LVOT Vmean:  67.900 cm/s LVOT VTI:    0.252 m  AORTA Ao Root diam: 3.20 cm Ao Asc diam:  3.70 cm MITRAL VALVE                TRICUSPID VALVE MV Area (PHT): 1.68 cm     TR Peak grad:   27.5 mmHg MV Area VTI:   1.26 cm     TR Vmax:        262.00 cm/s MV Peak grad:  18.8 mmHg MV Mean grad:  6.0 mmHg     SHUNTS MV Vmax:       2.17 m/s     Systemic VTI:  0.25 m MV Vmean:      117.0 cm/s   Systemic Diam: 2.20 cm MV Decel Time: 451 msec MV E velocity: 190.00 cm/s MV A velocity: 136.00 cm/s MV E/A ratio:  1.40 Gwyndolyn Kaufman MD Electronically signed by Gwyndolyn Kaufman MD Signature Date/Time: 10/05/2020/2:20:53 PM    Final     Subjective: He is still having some numbness left side, also hand with decreased sensation.  He denies chest pain. He feels tired. He has not been able to sleep. He report dyspnea on exertion.   Discharge Exam: Vitals:   10/05/20 0401 10/05/20 0932  BP: 131/70 129/76  Pulse: (!) 57   Resp: 16   Temp: 98.2 F (36.8 C)   SpO2: 100%      General: Pt is alert, awake, not in acute distress Cardiovascular: RRR, S1/S2 +, no rubs, no gallops Respiratory: CTA bilaterally, no wheezing, no rhonchi Abdominal: Soft, NT, ND, bowel sounds + Extremities: no edema, no cyanosis    The results of significant diagnostics from this hospitalization (including imaging, microbiology, ancillary and laboratory) are listed below for reference.     Microbiology: Recent Results (from the past 240 hour(s))  Resp Panel by RT-PCR (Flu A&B, Covid) Nasopharyngeal Swab     Status: None   Collection Time: 10/04/20  7:54 PM   Specimen: Nasopharyngeal Swab; Nasopharyngeal(NP) swabs in vial transport medium  Result Value Ref Range Status   SARS Coronavirus 2 by RT PCR NEGATIVE NEGATIVE Final    Comment: (NOTE) SARS-CoV-2 target nucleic acids are NOT DETECTED.  The SARS-CoV-2 RNA is generally detectable in upper respiratory specimens  during the acute phase of infection. The lowest concentration of SARS-CoV-2 viral copies this assay can detect is 138 copies/mL. A negative result does not preclude SARS-Cov-2 infection and should not be used as the sole basis for treatment or other patient management decisions. A negative result may occur with  improper specimen collection/handling, submission of specimen other than nasopharyngeal swab, presence of viral mutation(s) within the areas targeted by this assay, and inadequate number of viral copies(<138 copies/mL). A negative result must be combined with clinical observations, patient history, and epidemiological information. The expected result is Negative.  Fact Sheet for Patients:  EntrepreneurPulse.com.au  Fact Sheet for Healthcare Providers:  IncredibleEmployment.be  This test is no t yet approved or cleared by the [place] FDA and  has been authorized for detection and/or diagnosis of SARS-CoV-2 by FDA under an Emergency Use Authorization (EUA). This EUA will remain  in effect (meaning this test can be used) for  the duration of the COVID-19 declaration under Section 564(b)(1) of the Act, 21 U.S.C.section 360bbb-3(b)(1), unless the authorization is terminated  or revoked sooner.       Influenza A by PCR NEGATIVE NEGATIVE Final   Influenza B by PCR NEGATIVE NEGATIVE Final    Comment: (NOTE) The Xpert Xpress SARS-CoV-2/FLU/RSV plus assay is intended as an aid in the diagnosis of influenza from Nasopharyngeal swab specimens and should not be used as a sole basis for treatment. Nasal washings and aspirates are unacceptable for Xpert Xpress SARS-CoV-2/FLU/RSV testing.  Fact Sheet for Patients: EntrepreneurPulse.com.au  Fact Sheet for Healthcare Providers: IncredibleEmployment.be  This test is not yet approved or cleared by the [place] FDA and has been authorized for detection  and/or diagnosis of SARS-CoV-2 by FDA under an Emergency Use Authorization (EUA). This EUA will remain in effect (meaning this test can be used) for the duration of the COVID-19 declaration under Section 564(b)(1) of the Act, 21 U.S.C. section 360bbb-3(b)(1), unless the authorization is terminated or revoked.  Performed at [facility], [address] [place], [place] [address]      Labs: BNP (last 3 results) Recent Labs    03/07/20 1029 10/04/20 1331  BNP 161.1* 169.6*   Basic Metabolic Panel: Recent Labs  Lab 10/04/20 1331 10/05/20 0310  NA 134* 138  K 3.6 3.5  CL 101 103  CO2 24 23  GLUCOSE 113* 76  BUN 33* 29*  CREATININE 3.54* 3.44*  CALCIUM 9.6 9.6  MG 2.2  --    Liver Function Tests: No results for input(s): AST, ALT, ALKPHOS, BILITOT, PROT, ALBUMIN in the last 168 hours. No results for input(s): LIPASE, AMYLASE in the last 168 hours. No results for input(s): AMMONIA in the last 168 hours. CBC: Recent Labs  Lab 10/04/20 1331  WBC 6.4  NEUTROABS 4.6  HGB 14.7  HCT 43.3  MCV 96.4  PLT 160   Cardiac Enzymes: No results for input(s): CKTOTAL, CKMB, CKMBINDEX, TROPONINI in the last 168 hours. BNP: Invalid input(s): POCBNP CBG: Recent Labs  Lab 10/04/20 2157 10/05/20 0810 10/05/20 1236  GLUCAP 74 73 79   D-Dimer No results for input(s): DDIMER in the last 72 hours. Hgb A1c Recent Labs    10/05/20 0310  HGBA1C 6.0*   Lipid Profile Recent Labs    10/05/20 0912  CHOL 119  HDL 43  LDLCALC 58  TRIG 91  CHOLHDL 2.8   Thyroid function studies Recent Labs    10/05/20 0912  TSH 2.955   Anemia work up Recent Labs    10/05/20 0912  VITAMINB12 385   Urinalysis    Component Value Date/Time   COLORURINE YELLOW 10/04/2020 [place] 10/04/2020 1443   LABSPEC 1.025 10/04/2020 1443   PHURINE 5.0 10/04/2020 1443   GLUCOSEU NEGATIVE 10/04/2020 1443   HGBUR LARGE (A) 10/04/2020 1443   BILIRUBINUR NEGATIVE  10/04/2020 1443   [place] 10/04/2020 1443   PROTEINUR NEGATIVE 10/04/2020 1443   UROBILINOGEN 0.2 04/04/2015 1529   NITRITE NEGATIVE 10/04/2020 1443   LEUKOCYTESUR NEGATIVE 10/04/2020 1443   Sepsis Labs Invalid input(s): PROCALCITONIN,  WBC,  LACTICIDVEN Microbiology Recent Results (from the past 240 hour(s))  Resp Panel by RT-PCR (Flu A&B, Covid) Nasopharyngeal Swab     Status: None   Collection Time: 10/04/20  7:54 PM   Specimen: Nasopharyngeal Swab; Nasopharyngeal(NP) swabs in vial transport medium  Result Value Ref Range Status   SARS Coronavirus 2 by RT PCR NEGATIVE  NEGATIVE Final    Comment: (NOTE) SARS-CoV-2 target nucleic acids are NOT DETECTED.  The SARS-CoV-2 RNA is generally detectable in upper respiratory specimens during the acute phase of infection. The lowest concentration of SARS-CoV-2 viral copies this assay can detect is 138 copies/mL. A negative result does not preclude SARS-Cov-2 infection and should not be used as the sole basis for treatment or other patient management decisions. A negative result may occur with  improper specimen collection/handling, submission of specimen other than nasopharyngeal swab, presence of viral mutation(s) within the areas targeted by this assay, and inadequate number of viral copies(<138 copies/mL). A negative result must be combined with clinical observations, patient history, and epidemiological information. The expected result is Negative.  Fact Sheet for Patients:  EntrepreneurPulse.com.au  Fact Sheet for Healthcare Providers:  IncredibleEmployment.be  This test is no t yet approved or cleared by the [place] FDA and  has been authorized for detection and/or diagnosis of SARS-CoV-2 by FDA under an Emergency Use Authorization (EUA). This EUA will remain  in effect (meaning this test can be used) for the duration of the COVID-19 declaration under Section 564(b)(1) of the  Act, 21 U.S.C.section 360bbb-3(b)(1), unless the authorization is terminated  or revoked sooner.       Influenza A by PCR NEGATIVE NEGATIVE Final   Influenza B by PCR NEGATIVE NEGATIVE Final    Comment: (NOTE) The Xpert Xpress SARS-CoV-2/FLU/RSV plus assay is intended as an aid in the diagnosis of influenza from Nasopharyngeal swab specimens and should not be used as a sole basis for treatment. Nasal washings and aspirates are unacceptable for Xpert Xpress SARS-CoV-2/FLU/RSV testing.  Fact Sheet for Patients: EntrepreneurPulse.com.au  Fact Sheet for Healthcare Providers: IncredibleEmployment.be  This test is not yet approved or cleared by the [place] FDA and has been authorized for detection and/or diagnosis of SARS-CoV-2 by FDA under an Emergency Use Authorization (EUA). This EUA will remain in effect (meaning this test can be used) for the duration of the COVID-19 declaration under Section 564(b)(1) of the Act, 21 U.S.C. section 360bbb-3(b)(1), unless the authorization is terminated or revoked.  Performed at [facility], [address] [place], [place] [address]      Time coordinating discharge: 40 minutes  SIGNED:   Elmarie Shiley, MD  [facility]

## 2020-10-09 ENCOUNTER — Encounter (HOSPITAL_COMMUNITY): Payer: Medicare Other

## 2020-10-09 DIAGNOSIS — I120 Hypertensive chronic kidney disease with stage 5 chronic kidney disease or end stage renal disease: Secondary | ICD-10-CM | POA: Diagnosis not present

## 2020-10-09 DIAGNOSIS — N2581 Secondary hyperparathyroidism of renal origin: Secondary | ICD-10-CM | POA: Diagnosis not present

## 2020-10-09 DIAGNOSIS — N185 Chronic kidney disease, stage 5: Secondary | ICD-10-CM | POA: Diagnosis not present

## 2020-10-09 DIAGNOSIS — M109 Gout, unspecified: Secondary | ICD-10-CM | POA: Diagnosis not present

## 2020-10-09 DIAGNOSIS — G4700 Insomnia, unspecified: Secondary | ICD-10-CM | POA: Diagnosis not present

## 2020-10-09 DIAGNOSIS — I509 Heart failure, unspecified: Secondary | ICD-10-CM | POA: Diagnosis not present

## 2020-10-09 DIAGNOSIS — E1122 Type 2 diabetes mellitus with diabetic chronic kidney disease: Secondary | ICD-10-CM | POA: Diagnosis not present

## 2020-10-09 DIAGNOSIS — D631 Anemia in chronic kidney disease: Secondary | ICD-10-CM | POA: Diagnosis not present

## 2020-10-09 DIAGNOSIS — I4891 Unspecified atrial fibrillation: Secondary | ICD-10-CM | POA: Diagnosis not present

## 2020-10-10 ENCOUNTER — Other Ambulatory Visit: Payer: Self-pay

## 2020-10-10 ENCOUNTER — Encounter: Payer: Self-pay | Admitting: Internal Medicine

## 2020-10-10 ENCOUNTER — Ambulatory Visit (INDEPENDENT_AMBULATORY_CARE_PROVIDER_SITE_OTHER): Payer: Medicare Other | Admitting: Internal Medicine

## 2020-10-10 VITALS — BP 120/70 | HR 57 | Ht 70.0 in | Wt 194.8 lb

## 2020-10-10 DIAGNOSIS — E1165 Type 2 diabetes mellitus with hyperglycemia: Secondary | ICD-10-CM | POA: Diagnosis not present

## 2020-10-10 DIAGNOSIS — E782 Mixed hyperlipidemia: Secondary | ICD-10-CM | POA: Diagnosis not present

## 2020-10-10 DIAGNOSIS — E669 Obesity, unspecified: Secondary | ICD-10-CM

## 2020-10-10 DIAGNOSIS — E1159 Type 2 diabetes mellitus with other circulatory complications: Secondary | ICD-10-CM

## 2020-10-10 NOTE — Patient Instructions (Addendum)
Please continue: - Ozempic 0.5 mg weekly   Please stop Glipizide.  Please return in 4 months with your sugar log.

## 2020-10-10 NOTE — Progress Notes (Signed)
Patient ID: [name], male   DOB: Dec 22, 1945, 75 y.o.   MRN: [id number]   This visit occurred during the SARS-CoV-2 public health emergency.  Safety protocols were in place, including screening questions prior to the visit, additional usage of staff PPE, and extensive cleaning of exam room while observing appropriate contact time as indicated for disinfecting solutions.   HPI: [name] is a 75 y.o.-year-old male, initially referred by his nephrologist, Dr. 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 fib, CKD stage V, cerebrovascular disease, s/p TIA, ED).  Last visit 4 months ago. He is here with his wife who offers part of the history especially regarding blood sugars, medication doses, and past medical history.  Before last visit, he had COVID-19 infection (positive test 04/2020) and got antibody infusion.  He recovered well.  He was recently admitted 10/04/2020 for left-sided weakness after a fall.  A brain MRI showed an old stroke, but no new pathology.  Reviewed HbA1c levels: Lab Results  Component Value Date   HGBA1C 6.0 (H) 10/05/2020   HGBA1C 5.9 (A) 01/29/2020   HGBA1C 7.1 (A) 09/25/2019   HGBA1C 7.1 (A) 05/23/2019   HGBA1C 8.5 (A) 02/02/2019   HGBA1C 7.2 (H) 03/23/2016   HGBA1C 6.5 (H) 08/20/2012  01/11/2019: HbA1c 8.3% 10/04/2018: HbA1c 8.2%  He is currently on: - Ozempic 0.5 mg weekly >> stopped 04/2020 >> restarted 05/2020 - Glipizide 5 mg before a larger dinner >> did not stop as advised in 05/2020 (takes it daily in am) He came off metformin due to worsening kidney function.  Pt checks his sugars once a day: - am: 85, 120-157, 170, 182 >> 110-130 >> 69, 93, 100-177, 182,  224 >> 96-135, 154 - 2h after b'fast: n/c >> 207 >> 90, 110 - before lunch: n/c >> 102-130 - 2h after lunch: n/c >> 120-130 >> n/c >> 134, 165 - before dinner: 129 >>  n/c >> 86 - 2h after dinner: n/c >> 190 >> n/c  - bedtime: n/c >> 77-122 - nighttime: n/c Lowest sugar was 169 >> 161 >> 85 >> 104 >> 69; he has hypoglycemia awareness in the 70s. Highest sugar was 299 >> 193 >> 190 >> 160 >> 224.  Pt's meals are: - Breakfast: egg and bacon (not lately).   - Brunch: meat + veggies - Dinner: same as lunch - Snacks: crackers, popcorn   -He has stage V CKD-sees nephrology-did not have to start dialysis yet but has a fistula placed; last BUN/creatinine:  Lab Results  Component Value Date   BUN 29 (H) 10/05/2020   BUN 33 (H) 10/04/2020   CREATININE 3.44 (H) 10/05/2020   CREATININE 3.54 (H) 10/04/2020  01/11/2019: 45//3.79, GFR 16, glucose 149, ACR 52.5 10/04/2018: 32/3.78, GFR 15, glucose 184 He is not on ACE inhibitor/ARB.  -+ HL; last set of lipids: Lab Results  Component Value Date   CHOL 119 10/05/2020   HDL 43 10/05/2020   LDLCALC 58 10/05/2020   TRIG 91 10/05/2020   CHOLHDL 2.8 10/05/2020  He developed generalized aches and pains from pravastatin.  Also of Zetia.  He is currently on Praluent.  - last eye exam was in 11/2019: Reportedly no DR ([facility]).   - no numbness and tingling in his feet.  Pt has FH of DM in sister and brother.  He has a history of lap band surgery 2010. Lost from 6  lbs to 215-220 lbs.  He also has hypertension.  ROS: Constitutional: no weight gain/no weight loss, no fatigue, no subjective hyperthermia, no subjective hypothermia Eyes: no blurry vision, no xerophthalmia ENT: no sore throat, no nodules palpated in neck, no dysphagia, no odynophagia, no hoarseness Cardiovascular: no CP/no SOB/no palpitations/no leg swelling Respiratory: no cough/no SOB/no wheezing Gastrointestinal: no N/no V/no D/no C/no acid reflux Musculoskeletal: no muscle aches/no joint aches Skin: no rashes, no hair loss Neurological: no tremors/+ numbness/no tingling/no dizziness  I reviewed pt's medications, allergies, PMH, social hx,  family hx, and changes were documented in the history of present illness. Otherwise, unchanged from my initial visit note.  Past Medical History:  Diagnosis Date  . Allergic rhinitis   . Anxiety   . Atrial fibrillation ([place])   . Cancer ([place])    hx of skin cancers   . Chronic kidney disease   . Coronary artery disease    a. s/p MI & CABG; b. s/p PCI Diag;  c. Cath 12/2011: LAD diff dzs/small, Diag 75% isr, 90 dist to stent (small), LCX & RCA occluded, VG->OM 40, VG->PDA patent - med rx.  . Depression    PTSD  . Diabetes mellitus    not on medications  . Diastolic CHF, chronic (HCC)    a. EF 55-60% by echo 2007  . GERD (gastroesophageal reflux disease)    "not anymore" " I had a lap band"  . History of diverticulitis of colon    5 YRS AGO  . History of pneumonia    2017  . History of transient ischemic attack (TIA)    CAROTID DOPPLERS NOV 2011  0-39& BIL. STENOSIS  . Hyperlipidemia   . Hypertension   . Mitral regurgitation   . Myocardial infarction ([place])   . Neuromuscular disorder ([place])    left arm numbness   . 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 PTSD (post-traumatic stress disorder)    from [place]  . 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 Shortness of breath dyspnea    with exertion  . Sleep apnea    cpap- see ov [facility] in [facility] 05/12/11 for settings    Past Surgical History:  Procedure Laterality Date  . AV FISTULA PLACEMENT Left 08/02/2018   Procedure: ARTERIOVENOUS (AV) FISTULA CREATION RADIOCEPHALIC;  Surgeon: Angelia Mould, MD;  Location: [place];  Service: Vascular;  Laterality: Left;  . BLEPHAROPLASTY  1985   BILATERAL  . CARDIAC CATHETERIZATION  2001, 2004, 2009, 2011  . CARDIAC CATHETERIZATION N/A 01/23/2016    Procedure: Right/Left Heart Cath and Coronary Angiography;  Surgeon: [name], MD;  Location: [place] CV LAB;  Service: Cardiovascular;  Laterality: N/A;  . CARDIOVERSION N/A 09/07/2017   Procedure: CARDIOVERSION;  Surgeon: [name], MD;  Location: [facility] ENDOSCOPY;  Service: Cardiovascular;  Laterality: N/A;  . CARDIOVERSION N/A 09/13/2019   Procedure: CARDIOVERSION;  Surgeon: [name], MD;  Location: [facility] ENDOSCOPY;  Service: Cardiovascular;  Laterality: N/A;  . CARDIOVERSION N/A 10/19/2019   Procedure: CARDIOVERSION;  Surgeon: [name], MD;  Location: [facility] ENDOSCOPY;  Service: Cardiovascular;  Laterality: N/A;  . CATARACT EXTRACTION W/ INTRAOCULAR LENS IMPLANT Bilateral   . CHONDROPLASTY  07/22/2011   Procedure: CHONDROPLASTY;  Surgeon: Dione Plover Aluisio;  Location: [place];  Service: Orthopedics;;  . CIRCUMCISION  35 YRS  AGO  . COLONOSCOPY    . CORONARY ANGIOPLASTY  1996-- POST CABG   W/ STENT, last cath 01/07/2012   . CORONARY ARTERY BYPASS GRAFT  1995   X3 VESSEL  . CORONARY STENT PLACEMENT    . KNEE ARTHROSCOPY  07/22/2011   Procedure: ARTHROSCOPY KNEE;  Surgeon: [name];  Location: [place];  Service: Orthopedics;  Laterality: Right;  WITH DEBRIDEMENT   . KNEE ARTHROSCOPY W/ MENISCECTOMY  X2 IN 2002-- RIGHT KNEE  . LAPAROSCOPIC GASTRIC BANDING  01-15-09  . LEFT HEART CATH AND CORONARY ANGIOGRAPHY N/A 04/29/2017   Procedure: LEFT HEART CATH AND CORONARY ANGIOGRAPHY;  Surgeon: [name], MD;  Location: [place] CV LAB;  Service: Cardiovascular;  Laterality: N/A;  . LEFT HEART CATHETERIZATION WITH CORONARY ANGIOGRAM N/A 09/11/2013   Procedure: LEFT HEART CATHETERIZATION WITH CORONARY ANGIOGRAM;  Surgeon: [name], MD;  Location: [facility] CATH LAB;  Service: Cardiovascular;  Laterality: N/A;  . MITRAL VALVE REPAIR Right 03/25/2016   Procedure: MINIMALLY INVASIVE REOPERATION FOR MITRAL VALVE REPAIR  (MVR) with  size 26 Sorin Memo 3D;  Surgeon: [name], MD;  Location: [place];  Service: Open Heart Surgery;  Laterality: Right;  . RIGHT KNEE ED COMPARTMENT REPLACEMENT  2004  . SYNOVECTOMY  07/22/2011   Procedure: SYNOVECTOMY;  Surgeon: [name];  Location: [place];  Service: Orthopedics;;  . TEE WITHOUT CARDIOVERSION N/A 01/23/2016   Procedure: TRANSESOPHAGEAL ECHOCARDIOGRAM (TEE);  Surgeon: [name], MD;  Location: [place];  Service: Cardiovascular;  Laterality: N/A;  . TEE WITHOUT CARDIOVERSION N/A 03/25/2016   Procedure: TRANSESOPHAGEAL ECHOCARDIOGRAM (TEE);  Surgeon: [name], MD;  Location: [place];  Service: Open Heart Surgery;  Laterality: N/A;  . UMBILICAL HERNIA REPAIR  01-15-09   W/ GASTRIC BANDING PROCEDURE   Social History   Socioeconomic History  . Marital status: Married    Spouse name: Not on file  . Number of children: 1  . Years of education: Not on file  . Highest education level: Not on file  Occupational History  . Contractor, semi-retired  Social Needs  . Financial resource strain: Not on file  . Food insecurity:    Worry: Not on file    Inability: Not on file  . Transportation needs:    Medical: Not on file    Non-medical: Not on file  Tobacco Use  . Smoking status: Never Smoker  . Smokeless tobacco: Never Used  Substance and Sexual Activity  . Alcohol use: Yes, liquor    Comment: OCCASIONAL  . Drug use: No  . Sexual activity: Not on file  Lifestyle  . Physical activity:    Days per week: Not on file    Minutes per session: Not on file  . Stress: Not on file  Relationships  . Social connections:    Talks on phone: Not on file    Gets together: Not on file    Attends religious service: Not on file    Active member of club or organization: Not on file    Attends meetings of clubs or organizations: Not on file    Relationship status: Not on file  . Intimate partner violence:    Fear of current or ex partner: Not  on file    Emotionally abused: Not on file    Physically abused: Not on file  Forced sexual activity: Not on file  Other Topics Concern  . Not on file  Social History Narrative  . Not on file   Current Outpatient Medications on File Prior to Visit  Medication Sig Dispense Refill  . Alirocumab (PRALUENT) 75 MG/ML SOAJ Inject 75 mg into the skin every 14 (fourteen) days.    Marland Kitchen allopurinol (ZYLOPRIM) 100 MG tablet Take 100 mg by mouth in the morning and at bedtime.     Marland Kitchen amiodarone (PACERONE) 200 MG tablet Take 1 tablet (200 mg total) by mouth daily. 60 tablet 0  . apixaban (ELIQUIS) 5 MG TABS tablet Take 1 tablet (5 mg total) by mouth 2 (two) times daily. 180 tablet 3  . Artificial Saliva (MOUTH KOTE MT) Use as directed 4 sprays in the mouth or throat every 3 (three) hours as needed (for dry mouth).    . Ascorbic Acid (VITAMIN C WITH ROSE HIPS) 1000 MG tablet Take 1,000 mg by mouth daily.    . calcitRIOL (ROCALTROL) 0.25 MCG capsule Take 0.25 mcg by mouth at bedtime.     . cetirizine (ZYRTEC) 10 MG tablet Take 10 mg by mouth daily.    . colchicine 0.6 MG tablet Take 0.6-1.2 mg by mouth 2 (two) times daily as needed (gout).     . furosemide (LASIX) 40 MG tablet Take 40 mg by mouth daily.    Marland Kitchen glipiZIDE (GLUCOTROL) 5 MG tablet TAKE 1 TABLET TWICE A DAY BEFORE MEALS 180 tablet 1  . nitroGLYCERIN (NITROSTAT) 0.4 MG SL tablet 1 TAB UNDER THE TONGUE EVERY 5 MINUTES AS NEEDED FOR CHEST PAIN UP TO 3 DOSES, IF PERSIST CALL 911 75 tablet 1  . potassium chloride SA (K-DUR,KLOR-CON) 20 MEQ tablet Take 20 mEq by mouth daily.    . Semaglutide,0.25 or 0.5MG /DOS, (OZEMPIC, 0.25 OR 0.5 MG/DOSE,) 2 MG/1.5ML SOPN Inject 0.375 mLs (0.5 mg total) into the skin every Thursday. 3 pen 3  . tadalafil (CIALIS) 20 MG tablet TAKE 1 TABLET BY MOUTH ONCE DAILY AS NEEDED 10 tablet 0  . traZODone (DESYREL) 100 MG tablet Take 100 mg by mouth at bedtime.     No current facility-administered medications on file prior to  visit.   Allergies  Allergen Reactions  . Crestor [Rosuvastatin] Other (See Comments)    Muscle aches   . Lipitor [Atorvastatin] Other (See Comments)    Muscle aches  . Pravastatin Other (See Comments)    Muscle aches  . Codeine Itching and Other (See Comments)    Extremity tingling-- can take synthetic   Family History  Problem Relation Age of Onset  . Other Mother        joint problems  . Hypertension Father   . Heart disease Father   . CVA Father   . Depression Father   . Heart disease Sister        CABG  . Stroke Sister   . Hypertension Sister   . Congestive Heart Failure Sister   . Diabetes Sister   . Heart disease Brother   . Hypertension Brother   . Congestive Heart Failure Brother     PE: BP 120/70   Pulse (!) 57   Ht 5\' 10"  (1.778 m)   Wt 194 lb 12.8 oz (88.4 kg)   SpO2 99%   BMI 27.95 kg/m  Wt Readings from Last 3 Encounters:  10/10/20 194 lb 12.8 oz (88.4 kg)  10/05/20 193 lb 5.5 oz (87.7 kg)  06/06/20 194 lb 3.2 oz (88.1 kg)  Constitutional: overweight, in NAD Eyes: PERRLA, EOMI, no exophthalmos ENT: moist mucous membranes, no thyromegaly, no cervical lymphadenopathy Cardiovascular: RRR, No MRG Respiratory: CTA B Gastrointestinal: abdomen soft, NT, ND, BS+ Musculoskeletal: no deformities, strength intact in all 4 Skin: moist, warm, no rashes Neurological: no tremor with outstretched hands, DTR normal in all 4  ASSESSMENT: 1. DM2, non-insulin-dependent, uncontrolled, with long-term complications - CAD, s/p coronaroplasty, then CABG 1995 - cardiologist - Dr. Aundra Dubin - CHF, EF 55 to 65% - Atrial fibrillation, cardioversion in 2019 - Carotid artery disease - Cerebrovascular disease, s/p TIA - CKD stage V, pending dialysis -Dr. Jimmy Footman - ED  2. HL  3.  Obesity class I  PLAN:  1. Patient with longstanding, previously uncontrolled type 2 diabetes, with improved control before last visit, despite having a positive diagnosis of COVID-19  infection.  He had to come off Metformin in the past due to declining kidney function.  At last visit, he was off Ozempic due to diarrhea, but retrospectively, this was actually caused by his COVID-19 infection so at last visit I advised him to restart at the low-dose and increase as tolerated to 0.5 mg weekly.  At that time, we also stopped glipizide. -Earlier this month, he had another HbA1c, and this is excellent, at 6.0%.  This was checked at the time of admission left-sided weakness, possibly due to TIA. -At this visit, they tell me that they did not remember to stop glipizide. Therefore, patient is taking Ozempic and continues glipizide 5 mg taken before breakfast. He also tells me that he is occasionally skipping breakfast but he continues with the glipizide evening durations. We discussed that glipizide is a medication that should always be taken before a meal to avoid the risk of hypoglycemia. He does report subjective hypoglycemia for which he did not check blood sugars. -At today's visit, I again advised him to stop glipizide especially since he is tolerating Ozempic well. I advised him to let me know if the sugars increase. - I suggested to:  Patient Instructions  Please continue: - Ozempic 0.5 mg weekly   Please stop Glipizide.  Please return in 4 months with your sugar log.   - advised to check sugars at different times of the day - 1x a day, rotating check times - advised for yearly eye exams >> he is UTD - return to clinic in 4 months  2. HL -Reviewed latest lipid panel from 5 days ago: All fractions at goal: Lab Results  Component Value Date   CHOL 119 10/05/2020   HDL 43 10/05/2020   LDLCALC 58 10/05/2020   TRIG 91 10/05/2020   CHOLHDL 2.8 10/05/2020  -He had side effects from statins in the past, currently on Zetia and Praluent.  No side effects.  3.  Obesity class I -At last visit, we restarted Ozempic, which should also help with weight loss -He lost 12 pounds  before last visit but weight stable since then  Philemon Kingdom, MD PhD [facility] Endocrinology

## 2020-10-14 DIAGNOSIS — C4490 Unspecified malignant neoplasm of skin, unspecified: Secondary | ICD-10-CM | POA: Diagnosis not present

## 2020-10-14 DIAGNOSIS — R899 Unspecified abnormal finding in specimens from other organs, systems and tissues: Secondary | ICD-10-CM | POA: Diagnosis not present

## 2020-10-14 DIAGNOSIS — M25572 Pain in left ankle and joints of left foot: Secondary | ICD-10-CM | POA: Diagnosis not present

## 2020-10-14 DIAGNOSIS — Z09 Encounter for follow-up examination after completed treatment for conditions other than malignant neoplasm: Secondary | ICD-10-CM | POA: Diagnosis not present

## 2020-10-14 DIAGNOSIS — R2689 Other abnormalities of gait and mobility: Secondary | ICD-10-CM | POA: Diagnosis not present

## 2020-10-14 DIAGNOSIS — R8290 Unspecified abnormal findings in urine: Secondary | ICD-10-CM | POA: Diagnosis not present

## 2020-10-14 DIAGNOSIS — R200 Anesthesia of skin: Secondary | ICD-10-CM | POA: Diagnosis not present

## 2020-10-14 DIAGNOSIS — R5383 Other fatigue: Secondary | ICD-10-CM | POA: Diagnosis not present

## 2020-10-14 DIAGNOSIS — N184 Chronic kidney disease, stage 4 (severe): Secondary | ICD-10-CM | POA: Diagnosis not present

## 2020-10-14 DIAGNOSIS — I509 Heart failure, unspecified: Secondary | ICD-10-CM | POA: Diagnosis not present

## 2020-10-14 DIAGNOSIS — I679 Cerebrovascular disease, unspecified: Secondary | ICD-10-CM | POA: Diagnosis not present

## 2020-10-17 NOTE — Progress Notes (Signed)
Patient ID: [name], male   DOB: 1945-11-29, 75 y.o.   MRN: [id number] PCP: Dr Harrington Challenger Cardiology: Dr. Aundra Dubin  75 y.o. with history of CAD s/p CABG and obesity s/p lap banding procedure presents for cardiology followup.  Given exertional dyspnea and chest tightness, he had LHC in 1/15.  This showed no change from prior.  Grafts were patent and there was severe stenosis in distal D1, not amenable to PCI.   In 2017, he developed increased exertional dyspnea.  Eventually had a TEE showing severe MR with prolapse of a portion of the anterior mitral valve leaflet.  Coronary angiography in 6/17 showed patent grafts and 90% stenosis of distal diagonal not amenable to intervention.  In 8/17, he had mitral valve repair.  Post-op diastolic CHF, Lasix started and increased.   He had a cath again in 9/18 showing stable coronary anatomy.    He developed atrial arrhythmias in 12/18.  Both fibrillation and flutter were seen.  Saw Dr. Rayann Heman, recommended DCCV with amiodarone or sotalol if fibrillation recurs.  He had DCCV in 1/19.   In 6/19, he was noted to have developed AKI. Creatinine went up to 3.46.  Renal US was done, showing normal kidneys. Cause of AKI was not clear.  We stopped his Lasix. He saw nephrology and eventually went back on Lasix, currently taking 80 mg daily.  He is not using any NSAIDs.    He stopped pravastatin due to myalgias. We struggled to get him on a PCSK9 inhibitor through the [place] but he was finally able to get Praluent.    I had him do a Lexiscan Cardiolite in 11/19, which showed old inferior MI with no ischemia.  Echo in 11/19 showed EF 55-60%, s/p MV repair with mean gradient 6 mmHg. Echo 12/20 showed EF 50-55%, mildly decreased RV systolic function, repaired mitral valve with mild MR, mean gradient 6 mmHg across MV.    He had DCCV from atrial fibrillation to NSR in 1/21 and again in 2/21.   At last appointment, he reported significant fatigue and dyspnea in setting of weight  loss with low BP and HR.  I stopped his Coreg. He felt better with that change.    In 9/21, he had COVID-19 infection but was not hospitalized.   Recent hospitalization (10/04/20) for left-sided weakness and fall. CT head & MRI negative. Repeat echo EF 45-50%, LV global hypokinesis, severe LAE, mild/mod AS. Cardiology arranged for outpatient heart monitor to rule out arrhythmia causing symptoms. Outpatient Neurology follow up also scheduled. Discharge weight 194 lbs.  Today he returns for HF follow up. Overall feeling fine. Still having some left-sided weakness. Denies increasing SOB, CP,edema, or PND/Orthopnea. Has some dizziness with standing, resolves quickly. Appetite ok. No fever or chills. Weight at home ~188 pounds. Taking all medications. Struggling with PTSD/depression symptoms recently.  ECG (personally reviewed): SB AVB, pr 236 ms, 57 bpm (personally reviewed).  Labs (11/12): LDL 70 Labs (5/13): K 4.1, creatinine 1.1, BNP 48 Labs (12/13): K 3.4, creatinine 1.07, LDL 59, HDL 37 Labs (1/15): K 4.3, creatinine 1.2, HCT 40.3 Labs (3/15): LDL 38, HDL 81, BNP 55, K 4, creatinine 1.2 Labs (6/15): K 4.2, creatinine 1.4, LFTs normal, LDL 66, HDL 35 Labs (8/16): K 4, creatinine 1.6, BNP 64 Labs (12/16): LDL 61, HDL 37 Labs (4/17): K 4.2, creatinine 1.35, HCT 38.7, BNP 80 Labs (8/17): K 4.2, creatinine 1.4 => 1.66, BNP 159 Labs (12/17): K 3.6, creatinine 1.75 Labs (2/18): LDL 76, HDL  36 Labs (9/18): K 3.9, creatinine 1.76 Labs (1/19): K 4.9, creatinine 1.76, hgb 14 Labs (6/19): K 3.6, creatinine 3.46 Labs (7/19): K 4.1, creatinine 3.66, hgb 12.7 Labs (10/19): LDL 177, hgb 13.4, troponin negative, K 4, creatinine 4.15 Labs (12/19): K 4.3, creatinine 3.88 Labs (4/20): K 3.6, creatinine 3.39 Labs (11/20): LDL 73, HDL 38 Labs (2/21): K 3.4, creatinine 3.8 Labs (7/21): K 3.7, creatinine 3.66 Labs (2/22): K 3.5, creatinine 3.44, HDL 43, LDL 58, a1c 6.0  PMH: 1. OSA: on CPAP.  2. Diabetes  mellitus 3. Allergic rhinitis 4. CAD: s/p CABG 1995.  LHC (10/11) with 90% distal D1 (patent proximal D1 stent), total occlusion CFX, total occlusion RCA, no significant stenoses in LAD, SVG-PDA patent, SVG-OM patent, EF 55%.  Medical management.  LHC (5/13) with patent grafts and 90% distal D1 stenosis (no significant change from 10/11).  LHC (1/15) with patent grafts, 75% ISR in D1 stent, 90% stenosis distal D1 (small caliber). D1 not amenable to intervention.  - Coronary angiography (6/17): grafts patent, 90% stenosis distally in large diagonal not amenable to intervention.   - LHC (9/18): SVG-OM and SVG-PDA patent,  75% in-stent restenosis in proximal diagonal then 90% stenosis distally (small in caliber at that point), totally occluded LCx and RCA.  Left coronary system similar to prior caths, D1 may be source of angina.  - Lexiscan Cardiolite (11/19): EF 47%, old inferior MI, no ischemia.  5. Obesity: lap-banding in 5/11.  6. Chronic diastolic CHF: Echo (6/94) with EF 55-60%, moderate LVH, moderate diastolic dysfunction, s/p mitral valve repair with elevated mean gradient but normal pressure half-time.  - Echo (8/18): EF 55-60%, s/p MV repair with mean gradient 6 mmHg, PASP 28 mmHg, mildly decreased RV systolic function.  - Echo (11/19): EF 55-60%, s/p MV repair with mean gradient 6, PASP 42 mmHg, mildly dilated RV, dilated IVC.  - Echo (12/20): EF 50-55%, mildly decreased RV systolic function. S/p MV repair with mean gradient 6 mmHg, mild MR.  - Echo (2/22): EF 45-50%, LV global hypokinesis, severe LAE, mild/mod AS.  7. TIA: Carotid dopplers in 11/11 with 0-39% bilateral stenosis.  Carotid dopplers 6/17 with 1-39% RICA stenosis.  8. OA: Knees, C-spine. TKR 9/13.  9. HTN 10. Hyperlipidemia: Myalgias with atorvastatin and Crestor. Now on Praluent.  11. Diverticulosis 12. MItral regurgitation: TEE (6/17) with EF 55-60%, severe eccentric MR, prolapse of a portion of the anterior leaflet, peak  RV-RA gradient 45 mmHg.  - Mitral valve repair 8/17 13. CKD: Stage III.  14. Atrial fibrillation/atrial flutter: Both arrhythmias noted in 12/18.  DCCV 12/19.   - DCCV to NSR 1/21.  - DCCV to NSR 2/21.  15. AKI 6/19 => CKD stage 4: uncertain etiology.  Renal US was normal.  16. Gout 17. 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  Lives in [place].   Family History  Problem Relation Age of Onset  . Other Mother        joint problems  . Hypertension Father   . Heart disease Father   . CVA Father   . Depression Father   . Heart disease Sister        CABG  . Stroke Sister   . Hypertension Sister   . Congestive Heart Failure Sister   . Diabetes Sister   . Heart disease Brother   . Hypertension Brother   . Congestive Heart Failure Brother    ROS: All systems reviewed and negative  except as per HPI.   Current Outpatient Medications  Medication Sig Dispense Refill  . Alirocumab (PRALUENT) 75 MG/ML SOAJ Inject 75 mg into the skin every 14 (fourteen) days.    Marland Kitchen allopurinol (ZYLOPRIM) 100 MG tablet Take 100 mg by mouth in the morning and at bedtime.     Marland Kitchen amiodarone (PACERONE) 200 MG tablet Take 1 tablet (200 mg total) by mouth daily. 60 tablet 0  . apixaban (ELIQUIS) 5 MG TABS tablet Take 1 tablet (5 mg total) by mouth 2 (two) times daily. 180 tablet 3  . Artificial Saliva (MOUTH KOTE MT) Use as directed 4 sprays in the mouth or throat every 3 (three) hours as needed (for dry mouth).    . Ascorbic Acid (VITAMIN C WITH ROSE HIPS) 1000 MG tablet Take 1,000 mg by mouth daily.    . calcitRIOL (ROCALTROL) 0.25 MCG capsule Take 0.25 mcg by mouth every other day.    . cetirizine (ZYRTEC) 10 MG tablet Take 10 mg by mouth daily.    . colchicine 0.6 MG tablet Take 0.6-1.2 mg by mouth 2 (two) times daily as needed (gout).     . furosemide (LASIX) 40 MG tablet Take 40 mg by mouth daily.    . nitroGLYCERIN (NITROSTAT) 0.4 MG SL tablet 1 TAB UNDER THE  TONGUE EVERY 5 MINUTES AS NEEDED FOR CHEST PAIN UP TO 3 DOSES, IF PERSIST CALL 911 75 tablet 1  . potassium chloride SA (K-DUR,KLOR-CON) 20 MEQ tablet Take 20 mEq by mouth daily.    . Semaglutide,0.25 or 0.5MG /DOS, (OZEMPIC, 0.25 OR 0.5 MG/DOSE,) 2 MG/1.5ML SOPN Inject 0.375 mLs (0.5 mg total) into the skin every Thursday. 3 pen 3  . tadalafil (CIALIS) 20 MG tablet TAKE 1 TABLET BY MOUTH ONCE DAILY AS NEEDED 10 tablet 0  . traZODone (DESYREL) 100 MG tablet Take 100 mg by mouth at bedtime.     No current facility-administered medications for this encounter.   BP 126/80   Pulse (!) 56   Wt 87.4 kg (192 lb 9.6 oz)   SpO2 99%   BMI 27.64 kg/m    Wt Readings from Last 3 Encounters:  10/18/20 87.4 kg (192 lb 9.6 oz)  10/10/20 88.4 kg (194 lb 12.8 oz)  10/05/20 87.7 kg (193 lb 5.5 oz)   Physical Exam: General:  NAD. No resp difficulty HEENT: Normal Neck: Supple. No JVD. Carotids 2+ bilat; no bruits. No lymphadenopathy or thryomegaly appreciated. Cor: PMI nondisplaced. Regular rate & rhythm. No rubs, gallops or murmurs. Lungs: Clear Abdomen: Soft, nontender, nondistended. No hepatosplenomegaly. No bruits or masses. Good bowel sounds. Extremities: No cyanosis, clubbing, rash, edema, LUE fistula + bruit/thrill.  Neuro: Alert & oriented x 3, cranial nerves grossly intact. Moves all 4 extremities w/o difficulty. Affect pleasant.  Assessment/Plan: 1. 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 On Eliquis for stroke prevention, statins. Resolved. - LDL 58. HbA1c 6. - Has follow up with Neurology. 2. Exertional dyspnea: Felt to be 2/2 to post COVID. Repeat Echo EF 45-50%. Cardiology during recent admission planned to place Zio to ensure symptoms not caused by worrisome arrhythmias. - Will place Zio Patch 14 days. 3. S/p mitral valve repair:  Echo (2/22) showed stable mitral valve repair, moderate MS compared to prior echo with mild MR, mean gradient 6 mmHg.   -  With prosthetic material, should use antibiotic prophylaxis with dental work.  4. CAD:  He has severe stenosis in a distal D1 that is not  amenable to intervention.  This has been stable on last couple of caths. He has occluded RCA and LCx known from the past with SVGs to OM and PDA. Cardiolite in 11/19 showed no ischemia. No chest pain. Echo (2/22): EF 45-50%.  - No ASA with apixaban use.  - He cannot tolerate statins with myalgias.  Now on Praluent.    5. Hyperlipidemia: Myalgias with Crestor, pravastatin, and atorvastatin.  We were finally able to get him Praluent through the [place].  - Lipids ok (2/22). 6. Chronic diastolic CHF:  NYHA class II symptoms, he is not volume overloaded on exam.   - Continue Lasix 40 mg daily, BMET at PCP this week K 4.0, creatinine 3.82. 7. OSA: Continue CPAP.  8. Atrial fibrillation: Paroxysmal, in NSR after DCCV in 2/21.  He is now on amiodarone to maintain NSR given rapid recurrence of AF after DCCV in 1/21.  Probably not a good ablation candidate with size of atria.   - Continue apixaban 5 mg bid.  - Continue amiodarone 200 mg daily. LFTs & TSH ok (10/21). He will need a regular eye exam.  9. CKD stage 4: AKI without recovery, uncertain etiology.  He sees nephrology, there is concern that he is progressing towards HD. He now has AV fistula. Check BMET today.  10. Diabetes: He is now followed by endocrinology. - a1c 6.0 (2/22).  11. PTSD/depression: Patient endorses worsening irritability, increased fatigue, sleeping disturbances, and obsession with watching war coverage on news; he is a [place] veteran. - [facility]. - Would likely benefit from low-dose SSRI, but will defer to PCP. - Encouraged patient to follow up with PCP for medication management and possible referral for counseling.  Follow up in 3-4 months with Dr. Aundra Dubin.  [name] [facility] FNP-BC 10/18/2020

## 2020-10-18 ENCOUNTER — Ambulatory Visit (HOSPITAL_COMMUNITY)
Admission: RE | Admit: 2020-10-18 | Discharge: 2020-10-18 | Disposition: A | Discharge status: Home / Self Care | Payer: Medicare Other | Source: Ambulatory Visit | Attending: Family Medicine | Admitting: Family Medicine

## 2020-10-18 ENCOUNTER — Encounter (HOSPITAL_COMMUNITY): Payer: Self-pay

## 2020-10-18 ENCOUNTER — Other Ambulatory Visit (HOSPITAL_COMMUNITY): Payer: Self-pay | Admitting: Family Medicine

## 2020-10-18 ENCOUNTER — Other Ambulatory Visit: Payer: Self-pay

## 2020-10-18 VITALS — BP 126/80 | HR 56 | Wt 192.6 lb

## 2020-10-18 DIAGNOSIS — I340 Nonrheumatic mitral (valve) insufficiency: Secondary | ICD-10-CM | POA: Insufficient documentation

## 2020-10-18 DIAGNOSIS — Z9889 Other specified postprocedural states: Secondary | ICD-10-CM

## 2020-10-18 DIAGNOSIS — N184 Chronic kidney disease, stage 4 (severe): Secondary | ICD-10-CM

## 2020-10-18 DIAGNOSIS — F32A Depression, unspecified: Secondary | ICD-10-CM | POA: Diagnosis not present

## 2020-10-18 DIAGNOSIS — I2510 Atherosclerotic heart disease of native coronary artery without angina pectoris: Secondary | ICD-10-CM | POA: Diagnosis not present

## 2020-10-18 DIAGNOSIS — U099 Post covid-19 condition, unspecified: Secondary | ICD-10-CM | POA: Diagnosis not present

## 2020-10-18 DIAGNOSIS — Z9182 Personal history of military deployment: Secondary | ICD-10-CM | POA: Diagnosis not present

## 2020-10-18 DIAGNOSIS — R531 Weakness: Secondary | ICD-10-CM

## 2020-10-18 DIAGNOSIS — Z7984 Long term (current) use of oral hypoglycemic drugs: Secondary | ICD-10-CM | POA: Insufficient documentation

## 2020-10-18 DIAGNOSIS — R0609 Other forms of dyspnea: Secondary | ICD-10-CM

## 2020-10-18 DIAGNOSIS — E785 Hyperlipidemia, unspecified: Secondary | ICD-10-CM | POA: Diagnosis not present

## 2020-10-18 DIAGNOSIS — Z8249 Family history of ischemic heart disease and other diseases of the circulatory system: Secondary | ICD-10-CM | POA: Insufficient documentation

## 2020-10-18 DIAGNOSIS — Z9989 Dependence on other enabling machines and devices: Secondary | ICD-10-CM

## 2020-10-18 DIAGNOSIS — I480 Paroxysmal atrial fibrillation: Secondary | ICD-10-CM | POA: Diagnosis not present

## 2020-10-18 DIAGNOSIS — I130 Hypertensive heart and chronic kidney disease with heart failure and stage 1 through stage 4 chronic kidney disease, or unspecified chronic kidney disease: Secondary | ICD-10-CM | POA: Diagnosis not present

## 2020-10-18 DIAGNOSIS — E1122 Type 2 diabetes mellitus with diabetic chronic kidney disease: Secondary | ICD-10-CM

## 2020-10-18 DIAGNOSIS — F4310 Post-traumatic stress disorder, unspecified: Secondary | ICD-10-CM | POA: Insufficient documentation

## 2020-10-18 DIAGNOSIS — I5032 Chronic diastolic (congestive) heart failure: Secondary | ICD-10-CM | POA: Diagnosis not present

## 2020-10-18 DIAGNOSIS — R0600 Dyspnea, unspecified: Secondary | ICD-10-CM | POA: Diagnosis not present

## 2020-10-18 DIAGNOSIS — I69954 Hemiplegia and hemiparesis following unspecified cerebrovascular disease affecting left non-dominant side: Secondary | ICD-10-CM | POA: Diagnosis not present

## 2020-10-18 DIAGNOSIS — G4733 Obstructive sleep apnea (adult) (pediatric): Secondary | ICD-10-CM | POA: Diagnosis not present

## 2020-10-18 DIAGNOSIS — I252 Old myocardial infarction: Secondary | ICD-10-CM | POA: Diagnosis not present

## 2020-10-18 DIAGNOSIS — R55 Syncope and collapse: Secondary | ICD-10-CM

## 2020-10-18 DIAGNOSIS — E669 Obesity, unspecified: Secondary | ICD-10-CM | POA: Insufficient documentation

## 2020-10-18 DIAGNOSIS — Z7901 Long term (current) use of anticoagulants: Secondary | ICD-10-CM | POA: Insufficient documentation

## 2020-10-18 DIAGNOSIS — Z955 Presence of coronary angioplasty implant and graft: Secondary | ICD-10-CM | POA: Insufficient documentation

## 2020-10-18 DIAGNOSIS — Z79899 Other long term (current) drug therapy: Secondary | ICD-10-CM | POA: Insufficient documentation

## 2020-10-18 DIAGNOSIS — Z951 Presence of aortocoronary bypass graft: Secondary | ICD-10-CM | POA: Insufficient documentation

## 2020-10-18 DIAGNOSIS — Z794 Long term (current) use of insulin: Secondary | ICD-10-CM

## 2020-10-18 DIAGNOSIS — Z6827 Body mass index (BMI) 27.0-27.9, adult: Secondary | ICD-10-CM | POA: Diagnosis not present

## 2020-10-18 DIAGNOSIS — I770 Arteriovenous fistula, acquired: Secondary | ICD-10-CM | POA: Insufficient documentation

## 2020-10-18 NOTE — Patient Instructions (Signed)
Your provider has recommended that  you wear a Zio Patch for 14 days.  This monitor will record your heart rhythm for our review.  IF you have any symptoms while wearing the monitor please press the button.  If you have any issues with the patch or you notice a red or orange light on it please call the company at [phone].  Once you remove the patch please mail it back to the company as soon as possible so we can get the results.  Your physician recommends that you schedule a follow-up appointment in: 4 months  If you have any questions or concerns before your next appointment please send [place] a message through [place] or call our office at [phone].    TO LEAVE A MESSAGE FOR THE NURSE SELECT OPTION 2, PLEASE LEAVE A MESSAGE INCLUDING: . YOUR NAME . DATE OF BIRTH . CALL BACK NUMBER . REASON FOR CALL**this is important as we prioritize the call backs  YOU WILL RECEIVE A CALL BACK THE SAME DAY AS LONG AS YOU CALL BEFORE 4:00 PM

## 2020-10-23 ENCOUNTER — Ambulatory Visit
Admission: EM | Admit: 2020-10-23 | Discharge: 2020-10-23 | Disposition: A | Discharge status: Home / Self Care | Payer: Medicare Other | Attending: Family Medicine | Admitting: Family Medicine

## 2020-10-23 ENCOUNTER — Other Ambulatory Visit: Payer: Self-pay

## 2020-10-23 DIAGNOSIS — R0781 Pleurodynia: Secondary | ICD-10-CM | POA: Diagnosis not present

## 2020-10-23 DIAGNOSIS — S20211A Contusion of right front wall of thorax, initial encounter: Secondary | ICD-10-CM

## 2020-10-23 DIAGNOSIS — R0789 Other chest pain: Secondary | ICD-10-CM

## 2020-10-23 MED ORDER — DEXAMETHASONE SODIUM PHOSPHATE 10 MG/ML IJ SOLN: 5.0000 mg | Freq: Once | INTRAMUSCULAR | Status: DC

## 2020-10-23 MED ORDER — DEXAMETHASONE 1 MG/ML PO CONC
5.0000 mg | Freq: Once | ORAL | Status: AC
  Administered 2020-10-23: 5 mg via ORAL

## 2020-10-23 NOTE — ED Provider Notes (Signed)
[facility]    CSN: [id number] Arrival date & time: 10/23/20  1822      History   Chief Complaint Chief Complaint  Patient presents with  . Rib Injury    HPI [name] is a 75 y.o. male.   HPI  Patient presents today for evaluation of right rib pain related to an injury in which she was attempting to cut away a lock. Injury occur over 1 week ago. He has been taking tylenol as needed for related to pain. Patient endorses bruising right upper chest wall. Localized tenderness. Pain is exacerbated by coughing, deep breathing, and palpable tenderness. Patient is currently prescribed Eliquis anticoagulant therapy and has multiple comorbid conditions that limits medication therapy options. Past Medical History:  Diagnosis Date  . Allergic rhinitis   . Anxiety   . Atrial fibrillation ([place])   . Cancer ([place])    hx of skin cancers   . Chronic kidney disease   . Coronary artery disease    a. s/p MI & CABG; b. s/p PCI Diag;  c. Cath 12/2011: LAD diff dzs/small, Diag 75% isr, 90 dist to stent (small), LCX & RCA occluded, VG->OM 40, VG->PDA patent - med rx.  . Depression    PTSD  . Diabetes mellitus    not on medications  . Diastolic CHF, chronic (HCC)    a. EF 55-60% by echo 2007  . GERD (gastroesophageal reflux disease)    "not anymore" " I had a lap band"  . History of diverticulitis of colon    5 YRS AGO  . History of pneumonia    2017  . History of transient ischemic attack (TIA)    CAROTID DOPPLERS NOV 2011  0-39& BIL. STENOSIS  . Hyperlipidemia   . Hypertension   . Mitral regurgitation   . Myocardial infarction ([place])   . Neuromuscular disorder ([place])    left arm numbness   . 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 PTSD (post-traumatic stress disorder)    from [place]  . 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/25/2016   Complex valvuloplasty including artificial Gore-tex neochord placement x4, plication of  anterior commissure, and 26 mm Sorin Memo 3D ring annuloplasty via right mini thoracotomy approach  . Shortness of breath dyspnea    with exertion  . Sleep apnea    cpap- see ov note in [facility] 05/12/11 for settings     Patient Active Problem List   Diagnosis Date Noted  . Dyspnea 10/05/2020  . CHF (congestive heart failure) ([place]) 10/04/2020  . COVID-19 virus infection 05/24/2020  . Chronic kidney disease   . Coronary artery disease   . Type II diabetes mellitus ([place])   . Hypertension   . Cancer ([place])   . Encounter for therapeutic drug monitoring 04/03/2016  . Pneumothorax on right 03/29/2016  . 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/25/2016  . Atherosclerotic heart disease of native coronary artery without angina pectoris 03/10/2016  . Heart failure ([place]) 03/10/2016  . Cataract extraction status of eye 03/10/2016  . Other long term (current) drug therapy 03/10/2016  . Osteoarthritis of knee 03/10/2016  . Vitamin D deficiency 03/10/2016  . Mitral regurgitation   . Avitaminosis D 12/25/2015  . 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 Arthritis of knee, degenerative 12/25/2015  . H/O cataract extraction 12/25/2015  . Arteriosclerosis of coronary artery 12/25/2015  . Congestive heart failure ([place]) 12/25/2015  . Pure hypercholesterolemia 12/25/2015  .  Allergic rhinitis 12/25/2015  . Essential (primary) hypertension 12/25/2015  . Hypotension 01/29/2014  . Expected Blood loss, postoperative 04/29/2012  . Pain due to unicompartmental arthroplasty of knee ([place]) 04/27/2012  . Preoperative evaluation of a medical condition to rule out surgical contraindications (TAR required) 03/31/2012  . Diastolic CHF, chronic ([place]) 06/29/2011  . Preoperative evaluation to rule out surgical contraindication 06/29/2011  . CAROTID ARTERY STENOSIS 07/09/2010  . TIA 05/29/2010  . FATIGUE / MALAISE 05/29/2010  . CAD, AUTOLOGOUS BYPASS GRAFT 11/15/2008  . MYOCARDIAL INFARCTION 12/18/2007  . ALLERGIC  RHINITIS WITH CONJUNCTIVITIS 12/18/2007  . Poorly controlled type 2 diabetes mellitus with circulatory disorder ([place]) 12/05/2007  . Hyperlipidemia 12/05/2007  . OBESITY 12/05/2007  . Essential hypertension 12/05/2007  . Coronary atherosclerosis 12/05/2007  . DEGENERATIVE JOINT DISEASE 12/05/2007  . Obstructive sleep apnea 12/05/2007    Past Surgical History:  Procedure Laterality Date  . AV FISTULA PLACEMENT Left 08/02/2018   Procedure: ARTERIOVENOUS (AV) FISTULA CREATION RADIOCEPHALIC;  Surgeon: Angelia Mould, MD;  Location: [place];  Service: Vascular;  Laterality: Left;  . BLEPHAROPLASTY  1985   BILATERAL  . CARDIAC CATHETERIZATION  2001, 2004, 2009, 2011  . CARDIAC CATHETERIZATION N/A 01/23/2016   Procedure: Right/Left Heart Cath and Coronary Angiography;  Surgeon: [name], MD;  Location: [place] CV LAB;  Service: Cardiovascular;  Laterality: N/A;  . CARDIOVERSION N/A 09/07/2017   Procedure: CARDIOVERSION;  Surgeon: [name], MD;  Location[facility] ENDOSCOPY;  Service: Cardiovascular;  Laterality: N/A;  . CARDIOVERSION N/A 09/13/2019   Procedure: CARDIOVERSION;  Surgeon: [name], MD;  Location: [facility] ENDOSCOPY;  Service: Cardiovascular;  Laterality: N/A;  . CARDIOVERSION N/A 10/19/2019   Procedure: CARDIOVERSION;  Surgeon: [name], MD;  Location: [facility] ENDOSCOPY;  Service: Cardiovascular;  Laterality: N/A;  . CATARACT EXTRACTION W/ INTRAOCULAR LENS IMPLANT Bilateral   . CHONDROPLASTY  07/22/2011   Procedure: CHONDROPLASTY;  Surgeon: Dione Plover Aluisio;  Location: [place];  Service: Orthopedics;;  . CIRCUMCISION  35 YRS  AGO  . COLONOSCOPY    . CORONARY ANGIOPLASTY  1996-- POST CABG   W/ STENT, last cath 01/07/2012   . CORONARY ARTERY BYPASS GRAFT  1995   X3 VESSEL  . CORONARY STENT PLACEMENT    . KNEE ARTHROSCOPY  07/22/2011   Procedure: ARTHROSCOPY KNEE;  Surgeon: [name];  Location: [place];  Service:  Orthopedics;  Laterality: Right;  WITH DEBRIDEMENT   . KNEE ARTHROSCOPY W/ MENISCECTOMY  X2 IN 2002-- RIGHT KNEE  . LAPAROSCOPIC GASTRIC BANDING  01-15-09  . LEFT HEART CATH AND CORONARY ANGIOGRAPHY N/A 04/29/2017   Procedure: LEFT HEART CATH AND CORONARY ANGIOGRAPHY;  Surgeon: [name], MD;  Location: [place] CV LAB;  Service: Cardiovascular;  Laterality: N/A;  . LEFT HEART CATHETERIZATION WITH CORONARY ANGIOGRAM N/A 09/11/2013   Procedure: LEFT HEART CATHETERIZATION WITH CORONARY ANGIOGRAM;  Surgeon: [name], MD;  Location: [facility] CATH LAB;  Service: Cardiovascular;  Laterality: N/A;  . MITRAL VALVE REPAIR Right 03/25/2016   Procedure: MINIMALLY INVASIVE REOPERATION FOR MITRAL VALVE REPAIR  (MVR) with size 26 Sorin Memo 3D;  Surgeon: [name], MD;  Location: [place];  Service: Open Heart Surgery;  Laterality: Right;  . RIGHT KNEE ED COMPARTMENT REPLACEMENT  2004  . SYNOVECTOMY  07/22/2011   Procedure: SYNOVECTOMY;  Surgeon: [name];  Location: [place];  Service: Orthopedics;;  . TEE WITHOUT CARDIOVERSION N/A 01/23/2016   Procedure:  TRANSESOPHAGEAL ECHOCARDIOGRAM (TEE);  Surgeon: [name], MD;  Location: [place];  Service: Cardiovascular;  Laterality: N/A;  . TEE WITHOUT CARDIOVERSION N/A 03/25/2016   Procedure: TRANSESOPHAGEAL ECHOCARDIOGRAM (TEE);  Surgeon: [name], MD;  Location: [place];  Service: Open Heart Surgery;  Laterality: N/A;  . UMBILICAL HERNIA REPAIR  01-15-09   W/ GASTRIC BANDING PROCEDURE       Home Medications    Prior to Admission medications   Medication Sig Start Date End Date Taking? Authorizing Provider  Alirocumab (PRALUENT) 75 MG/ML SOAJ Inject 75 mg into the skin every 14 (fourteen) days.    [provider]  allopurinol (ZYLOPRIM) 100 MG tablet Take 100 mg by mouth in the morning and at bedtime.  07/03/19   [provider]  amiodarone (PACERONE) 200 MG tablet Take 1 tablet (200 mg total)  by mouth daily. 10/19/19   [name], MD  apixaban (ELIQUIS) 5 MG TABS tablet Take 1 tablet (5 mg total) by mouth 2 (two) times daily. 09/09/17   [name], MD  Artificial Saliva (MOUTH KOTE MT) Use as directed 4 sprays in the mouth or throat every 3 (three) hours as needed (for dry mouth).    [provider]  Ascorbic Acid (VITAMIN C WITH ROSE HIPS) 1000 MG tablet Take 1,000 mg by mouth daily.    [provider]  calcitRIOL (ROCALTROL) 0.25 MCG capsule Take 0.25 mcg by mouth every other day.    [provider]  cetirizine (ZYRTEC) 10 MG tablet Take 10 mg by mouth daily.    [provider]  colchicine 0.6 MG tablet Take 0.6-1.2 mg by mouth 2 (two) times daily as needed (gout).  04/14/19   [provider]  furosemide (LASIX) 40 MG tablet Take 40 mg by mouth daily.    [provider]  nitroGLYCERIN (NITROSTAT) 0.4 MG SL tablet 1 TAB UNDER THE TONGUE EVERY 5 MINUTES AS NEEDED FOR CHEST PAIN UP TO 3 DOSES, IF PERSIST CALL 911 03/05/20   [name], MD  potassium chloride SA (K-DUR,KLOR-CON) 20 MEQ tablet Take 20 mEq by mouth daily.    [provider]  Semaglutide,0.25 or 0.5MG /DOS, (OZEMPIC, 0.25 OR 0.5 MG/DOSE,) 2 MG/1.5ML SOPN Inject 0.375 mLs (0.5 mg total) into the skin every Thursday. 02/01/20   Philemon Kingdom, MD  tadalafil (CIALIS) 20 MG tablet TAKE 1 TABLET BY MOUTH ONCE DAILY AS NEEDED 01/08/20   McKenzie, Candee Furbish, MD  traZODone (DESYREL) 100 MG tablet Take 100 mg by mouth at bedtime.    [provider]    Family History Family History  Problem Relation Age of Onset  . Other Mother        joint problems  . Hypertension Father   . Heart disease Father   . CVA Father   . Depression Father   . Heart disease Sister        CABG  . Stroke Sister   . Hypertension Sister   . Congestive Heart Failure Sister   . Diabetes Sister   . Heart disease Brother   . Hypertension Brother   . Congestive Heart  Failure Brother     Social History Social History   Tobacco Use  . Smoking status: Never Smoker  . Smokeless tobacco: Never Used  Vaping Use  . Vaping Use: Never used  Substance Use Topics  . Alcohol use: Not Currently    Alcohol/week: 2.0 standard drinks    Types: 1 Glasses of  wine, 1 Shots of liquor per week    Comment: OCCASIONAL  . Drug use: No     Allergies   Crestor [rosuvastatin], Lipitor [atorvastatin], Pravastatin, and Codeine   Review of Systems Review of Systems Pertinent negatives listed in HPI  Physical Exam Triage Vital Signs ED Triage Vitals  Enc Vitals Group     BP 10/23/20 1854 (!) 145/66     Pulse Rate 10/23/20 1854 69     Resp 10/23/20 1854 20     Temp 10/23/20 1854 97.8 F (36.6 C)     Temp Source 10/23/20 1854 Oral     SpO2 10/23/20 1854 98 %     Weight --      Height --      Head Circumference --      Peak Flow --      Pain Score 10/23/20 1907 8     Pain Loc --      Pain Edu? --      Excl. in [place]? --    No data found.  Updated Vital Signs BP (!) 145/66 (BP Location: Right Arm)   Pulse 69   Temp 97.8 F (36.6 C) (Oral)   Resp 20   SpO2 98%   Visual Acuity Right Eye Distance:   Left Eye Distance:   Bilateral Distance:    Right Eye Near:   Left Eye Near:    Bilateral Near:     Physical Exam Constitutional:      Appearance: He is not ill-appearing.  HENT:     Head: Normocephalic and atraumatic.  Cardiovascular:     Rate and Rhythm: Normal rate and regular rhythm.  Pulmonary:     Effort: Pulmonary effort is normal.  Chest:     Chest wall: Tenderness present.    Musculoskeletal:        General: Normal range of motion.  Skin:    Capillary Refill: Capillary refill takes less than 2 seconds.  Neurological:     General: No focal deficit present.     Mental Status: He is alert and oriented to person, place, and time.  Psychiatric:        Mood and Affect: Mood normal.        Behavior: Behavior normal.        Thought  Content: Thought content normal.        Judgment: Judgment normal.      UC Treatments / Results  Labs (all labs ordered are listed, but only abnormal results are displayed) Labs Reviewed - No data to display  EKG   Radiology No results found.  Procedures Procedures (including critical care time)  Medications Ordered in UC Medications  dexamethasone (DECADRON) 1 MG/ML solution 5 mg (5 mg Oral Given 10/23/20 1935)    Initial Impression / Assessment and Plan / UC Course  I have reviewed the triage vital signs and the nursing notes.  Pertinent labs & imaging results that were available during my care of the patient were reviewed by me and considered in my medical decision making (see chart for details).    Rib pain and contusion to right chest wall, trial decadron 5 mg oral. Continue tylenol every 4-6 hours as needed for pain. Follow-up with PCP if symptoms worsen or doesn't improvement. Final Clinical Impressions(s) / UC Diagnoses   Final diagnoses:  Rib pain on right side  Contusion of right chest wall, initial encounter   Discharge Instructions   None    ED Prescriptions  None     PDMP not reviewed this encounter.   Scot Jun, FNP 10/27/20 2006

## 2020-10-23 NOTE — ED Triage Notes (Addendum)
Pt states he was cutting a lock off his camper with large pliers and grasped hard causing pain and bruising to rt upper rib and chest area x1wk. States pain hasn't got better. Pt requesting x-rays. States took hydrocodone 5mg  this morning with no relief.

## 2020-11-05 ENCOUNTER — Encounter: Payer: Self-pay | Admitting: *Deleted

## 2020-11-06 ENCOUNTER — Ambulatory Visit (INDEPENDENT_AMBULATORY_CARE_PROVIDER_SITE_OTHER): Payer: Medicare Other | Admitting: Diagnostic Neuroimaging

## 2020-11-06 ENCOUNTER — Encounter: Payer: Self-pay | Admitting: Diagnostic Neuroimaging

## 2020-11-06 ENCOUNTER — Other Ambulatory Visit: Payer: Self-pay

## 2020-11-06 VITALS — BP 115/65 | HR 64 | Ht 70.0 in | Wt 191.6 lb

## 2020-11-06 DIAGNOSIS — R202 Paresthesia of skin: Secondary | ICD-10-CM | POA: Diagnosis not present

## 2020-11-06 DIAGNOSIS — R200 Anesthesia of skin: Secondary | ICD-10-CM

## 2020-11-06 DIAGNOSIS — I2510 Atherosclerotic heart disease of native coronary artery without angina pectoris: Secondary | ICD-10-CM | POA: Diagnosis not present

## 2020-11-06 DIAGNOSIS — R55 Syncope and collapse: Secondary | ICD-10-CM | POA: Diagnosis not present

## 2020-11-06 NOTE — Progress Notes (Signed)
[facility]  PATIENT: [name] DOB: 1946/04/19  REFERRING CLINICIAN: [name], MD HISTORY FROM: PATIENT  REASON FOR VISIT: NEW CONSULT    HISTORICAL  CHIEF COMPLAINT:  Chief Complaint  Patient presents with  . Stroke-like symptms    Rm 7 New Pt, wife- [name] "balance issues, weakness of left side"     HISTORY OF PRESENT ILLNESS:   75 year old male here evaluation of left-sided weakness, numbness, gait difficulty.  Patient presented to hospital on 10/04/2020 for ataxia, numbness, weakness, shortness of breath and dyspnea on exertion.  Patient was admitted for evaluation.  Cardiac and neurology evaluation were completed.  MRI of the brain was negative for acute stroke.  Patient was already on Eliquis and statin.  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  Since that time patient has some issues with veering towards the left side, especially when he wakes up in the middle the night to use the bathroom.  During the daytime he feels better.  Sometimes if he sleeps with his arms folded across his chest he feels some numbness in his hands.  Symptoms improved if he straightens his arms out.   REVIEW OF SYSTEMS: Full 14 system review of systems performed and negative with exception of: as per HPI.  ALLERGIES: Allergies  Allergen Reactions  . Crestor [Rosuvastatin] Other (See Comments)    Muscle aches   . Lipitor [Atorvastatin] Other (See Comments)    Muscle aches  . Pravastatin Other (See Comments)    Muscle aches  . Carvedilol     Other reaction(s): loss of appetite  . Metformin Diarrhea  . Serotonin Reuptake Inhibitors (Ssris)     Other reaction(s): Muscle aches  . Zocor [Simvastatin]     Other reaction(s): Muscle pain  . Codeine Itching and Other (See Comments)    Extremity tingling-- can take synthetic    HOME MEDICATIONS: Outpatient Medications Prior to Visit  Medication Sig Dispense Refill  .  Alirocumab (PRALUENT) 75 MG/ML SOAJ Inject 75 mg into the skin every 14 (fourteen) days.    Marland Kitchen allopurinol (ZYLOPRIM) 100 MG tablet Take 100 mg by mouth in the morning and at bedtime.     Marland Kitchen amiodarone (PACERONE) 200 MG tablet Take 1 tablet (200 mg total) by mouth daily. 60 tablet 0  . apixaban (ELIQUIS) 5 MG TABS tablet Take 1 tablet (5 mg total) by mouth 2 (two) times daily. 180 tablet 3  . Ascorbic Acid (VITAMIN C WITH ROSE HIPS) 1000 MG tablet Take 1,000 mg by mouth daily.    . calcitRIOL (ROCALTROL) 0.25 MCG capsule Take 0.25 mcg by mouth every other day.    . cetirizine (ZYRTEC) 10 MG tablet Take 10 mg by mouth daily.    . colchicine 0.6 MG tablet Take 0.6-1.2 mg by mouth 2 (two) times daily as needed (gout).     . furosemide (LASIX) 40 MG tablet Take 40 mg by mouth daily.    Marland Kitchen glipiZIDE (GLUCOTROL) 5 MG tablet Take 5 mg by mouth daily before breakfast.    . nitroGLYCERIN (NITROSTAT) 0.4 MG SL tablet 1 TAB UNDER THE TONGUE EVERY 5 MINUTES AS NEEDED FOR CHEST PAIN UP TO 3 DOSES, IF PERSIST CALL 911 75 tablet 1  . pantoprazole (PROTONIX) 40 MG tablet Take 40 mg by mouth as needed.    . potassium chloride SA (K-DUR,KLOR-CON) 20 MEQ tablet Take 20 mEq by mouth daily.    . Semaglutide,0.25 or 0.5MG /DOS, ([place],  0.25 OR 0.5 MG/DOSE,) 2 MG/1.5ML SOPN Inject 0.375 mLs (0.5 mg total) into the skin every Thursday. 3 pen 3  . traZODone (DESYREL) 100 MG tablet Take 100 mg by mouth at bedtime.    . Artificial Saliva (MOUTH KOTE MT) Use as directed 4 sprays in the mouth or throat every 3 (three) hours as needed (for dry mouth). (Patient not taking: Reported on 11/06/2020)    . tadalafil (CIALIS) 20 MG tablet TAKE 1 TABLET BY MOUTH ONCE DAILY AS NEEDED (Patient not taking: Reported on 11/06/2020) 10 tablet 0   No facility-administered medications prior to visit.    PAST MEDICAL HISTORY: Past Medical History:  Diagnosis Date  . Allergic rhinitis   . Anxiety   . Atrial fibrillation ([place])   . Cancer  ([place])    hx of skin cancers   . Chronic kidney disease   . Coronary artery disease    a. s/p MI & CABG; b. s/p PCI Diag;  c. Cath 12/2011: LAD diff dzs/small, Diag 75% isr, 90 dist to stent (small), LCX & RCA occluded, VG->OM 40, VG->PDA patent - med rx.  . Depression    PTSD  . Diabetes mellitus    not on medications  . Diastolic CHF, chronic (HCC)    a. EF 55-60% by echo 2007  . GERD (gastroesophageal reflux disease)    "not anymore" " I had a lap band"  . History of diverticulitis of colon    5 YRS AGO  . History of pneumonia    2017  . History of transient ischemic attack (TIA)    CAROTID DOPPLERS NOV 2011  0-39& BIL. STENOSIS  . Hyperlipidemia   . Hypertension   . Kidney failure   . Mitral regurgitation   . Myocardial infarction ([place])   . Neuromuscular disorder ([place])    left arm numbness   . 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 PTSD (post-traumatic stress disorder)    from [place] since 1973  . 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 Shortness of breath dyspnea    with exertion  . Skin cancer   . Sleep apnea    cpap- see ov note in [facility] 05/12/11 for settings   . Stroke ([place])    hx of    PAST SURGICAL HISTORY: Past Surgical History:  Procedure Laterality Date  . AV FISTULA PLACEMENT Left 08/02/2018   Procedure: ARTERIOVENOUS (AV) FISTULA CREATION RADIOCEPHALIC;  Surgeon: Angelia Mould, MD;  Location: [place];  Service: Vascular;  Laterality: Left;  . BLEPHAROPLASTY  1985   BILATERAL  . CARDIAC CATHETERIZATION  2001, 2004, 2009, 2011  . CARDIAC CATHETERIZATION N/A 01/23/2016   Procedure: Right/Left Heart Cath and Coronary Angiography;  Surgeon: [name], MD;  Location: [place] CV LAB;  Service:  Cardiovascular;  Laterality: N/A;  . CARDIOVERSION N/A 09/07/2017   Procedure: CARDIOVERSION;  Surgeon: [name], MD;  Location: [facility] ENDOSCOPY;  Service: Cardiovascular;  Laterality: N/A;  . CARDIOVERSION N/A 09/13/2019   Procedure: CARDIOVERSION;  Surgeon: [name], MD;  Location: [facility] ENDOSCOPY;  Service: Cardiovascular;  Laterality: N/A;  . CARDIOVERSION N/A 10/19/2019   Procedure: CARDIOVERSION;  Surgeon: [name], MD;  Location: [facility] ENDOSCOPY;  Service: Cardiovascular;  Laterality: N/A;  . CATARACT  EXTRACTION W/ INTRAOCULAR LENS IMPLANT Bilateral   . CHONDROPLASTY  07/22/2011   Procedure: CHONDROPLASTY;  Surgeon: Dione Plover Aluisio;  Location: [place];  Service: Orthopedics;;  . CIRCUMCISION  35 YRS  AGO  . COLONOSCOPY    . CORONARY ANGIOPLASTY  1996-- POST CABG   W/ STENT, last cath 01/07/2012   . CORONARY ARTERY BYPASS GRAFT  1995   X3 VESSEL  . CORONARY ARTERY BYPASS GRAFT  1995  . CORONARY STENT PLACEMENT    . KNEE ARTHROSCOPY  07/22/2011   Procedure: ARTHROSCOPY KNEE;  Surgeon: [name];  Location: [place];  Service: Orthopedics;  Laterality: Right;  WITH DEBRIDEMENT   . KNEE ARTHROSCOPY W/ MENISCECTOMY  X2 IN 2002-- RIGHT KNEE  . LAPAROSCOPIC GASTRIC BANDING  01-15-09  . LEFT HEART CATH AND CORONARY ANGIOGRAPHY N/A 04/29/2017   Procedure: LEFT HEART CATH AND CORONARY ANGIOGRAPHY;  Surgeon: [name], MD;  Location: [place] CV LAB;  Service: Cardiovascular;  Laterality: N/A;  . LEFT HEART CATHETERIZATION WITH CORONARY ANGIOGRAM N/A 09/11/2013   Procedure: LEFT HEART CATHETERIZATION WITH CORONARY ANGIOGRAM;  Surgeon: [name], MD;  Location: [facility] CATH LAB;  Service: Cardiovascular;  Laterality: N/A;  . MITRAL VALVE REPAIR Right 03/25/2016   Procedure: MINIMALLY INVASIVE REOPERATION FOR MITRAL VALVE REPAIR  (MVR) with size 26 Sorin Memo 3D;  Surgeon: [name], MD;  Location: [place];  Service: Open Heart  Surgery;  Laterality: Right;  . MITRAL VALVE REPAIR  03/2016  . RIGHT KNEE ED COMPARTMENT REPLACEMENT  2004  . SYNOVECTOMY  07/22/2011   Procedure: SYNOVECTOMY;  Surgeon: [name];  Location: [place];  Service: Orthopedics;;  . TEE WITHOUT CARDIOVERSION N/A 01/23/2016   Procedure: TRANSESOPHAGEAL ECHOCARDIOGRAM (TEE);  Surgeon: [name], MD;  Location: [place];  Service: Cardiovascular;  Laterality: N/A;  . TEE WITHOUT CARDIOVERSION N/A 03/25/2016   Procedure: TRANSESOPHAGEAL ECHOCARDIOGRAM (TEE);  Surgeon: [name], MD;  Location: [place];  Service: Open Heart Surgery;  Laterality: N/A;  . UMBILICAL HERNIA REPAIR  01-15-09   W/ GASTRIC BANDING PROCEDURE    FAMILY HISTORY: Family History  Problem Relation Age of Onset  . Other Mother        joint problems  . Alzheimer's disease Mother   . Hypertension Father   . Heart disease Father   . CVA Father        age 54  . Depression Father   . Heart disease Sister        CABG  . Stroke Sister   . Heart failure Sister        congestive  . Diabetes Sister   . Heart disease Brother   . Hypertension Brother   . Congestive Heart Failure Brother     SOCIAL HISTORY: Social History   Socioeconomic History  . Marital status: Married    Spouse name: [name]  . Number of children: 1  . Years of education: Not on file  . Highest education level: Not on file  Occupational History    Comment: Chief Financial Officer retired  Tobacco Use  . Smoking status: Never Smoker  . Smokeless tobacco: Never Used  Vaping Use  . Vaping Use: Never used  Substance and Sexual Activity  . Alcohol use: Not Currently    Alcohol/week: 2.0 standard drinks    Types: 1 Glasses of wine, 1 Shots of liquor per week    Comment: OCCASIONAL  . Drug use:  Never  . Sexual activity: Not on file  Other Topics Concern  . Not on file  Social History Narrative   Lives with wife   occas soda   Social Determinants of Health    Financial Resource Strain: Not on file  Food Insecurity: Not on file  Transportation Needs: Not on file  Physical Activity: Not on file  Stress: Not on file  Social Connections: Not on file  Intimate Partner Violence: Not on file     PHYSICAL EXAM  GENERAL EXAM/CONSTITUTIONAL: Vitals:  Vitals:   11/06/20 1045  BP: 115/65  Pulse: 64  Weight: 191 lb 9.6 oz (86.9 kg)  Height: 5\' 10"  (1.778 m)   Body mass index is 27.49 kg/m. Wt Readings from Last 3 Encounters:  11/06/20 191 lb 9.6 oz (86.9 kg)  10/18/20 192 lb 9.6 oz (87.4 kg)  10/10/20 194 lb 12.8 oz (88.4 kg)    Patient is in no distress; well developed, nourished and groomed; neck is supple  CARDIOVASCULAR:  Examination of carotid arteries is normal; no carotid bruits  Regular rate and rhythm, no murmurs  Examination of peripheral vascular system by observation and palpation is normal  EYES:  Ophthalmoscopic exam of optic discs and posterior segments is normal; no papilledema or hemorrhages No exam data present  MUSCULOSKELETAL:  Gait, strength, tone, movements noted in Neurologic exam below  NEUROLOGIC: MENTAL STATUS:  No flowsheet data found.  awake, alert, oriented to person, place and time  recent and remote memory intact  normal attention and concentration  language fluent, comprehension intact, naming intact  fund of knowledge appropriate  CRANIAL NERVE:   2nd - no papilledema on fundoscopic exam  2nd, 3rd, 4th, 6th - pupils equal and reactive to light, visual fields full to confrontation, extraocular muscles intact, no nystagmus  5th - facial sensation symmetric  7th - facial strength symmetric  8th - hearing intact  9th - palate elevates symmetrically, uvula midline  11th - shoulder shrug symmetric  12th - tongue protrusion midline  MOTOR:   normal bulk and tone  ATROPHY AND WEAKNESS OF LEFT > RIGHT HAND INTRINSIC MUSCLES  WEAKNESS OF BILATERAL FOOT DF  (4/5)  OTHERWISE FULL STRENGTH  SENSORY:   normal and symmetric to light touch, temperature, vibration; DECR IN FINGERS AND FEET  COORDINATION:   finger-nose-finger, fine finger movements normal  REFLEXES:   deep tendon reflexes TRACE and symmetric  GAIT/STATION:   narrow based gait; LIMPING GAIT; RIGHT FOOT TURNED OUTWARD; ABLE TO TAKE STEPS ON TOES AND HEELS     DIAGNOSTIC DATA (LABS, IMAGING, TESTING) - I reviewed patient records, labs, notes, testing and imaging myself where available.  Lab Results  Component Value Date   WBC 6.4 10/04/2020   HGB 14.7 10/04/2020   HCT 43.3 10/04/2020   MCV 96.4 10/04/2020   PLT 160 10/04/2020      Component Value Date/Time   NA 138 10/05/2020 0310   NA 140 09/03/2017 0933   K 3.5 10/05/2020 0310   CL 103 10/05/2020 0310   CO2 23 10/05/2020 0310   GLUCOSE 76 10/05/2020 0310   BUN 29 (H) 10/05/2020 0310   BUN 24 09/03/2017 0933   CREATININE 3.44 (H) 10/05/2020 0310   CREATININE 1.66 (H) 06/05/2016 1413   CALCIUM 9.6 10/05/2020 0310   PROT 6.2 (L) 06/06/2020 1537   ALBUMIN 3.6 06/06/2020 1537   AST 22 06/06/2020 1537   ALT 24 06/06/2020 1537   ALKPHOS 92 06/06/2020 1537   BILITOT  0.9 06/06/2020 1537   GFRNONAA 18 (L) 10/05/2020 0310   GFRAA 18 (L) 03/07/2020 1046   Lab Results  Component Value Date   CHOL 119 10/05/2020   HDL 43 10/05/2020   LDLCALC 58 10/05/2020   TRIG 91 10/05/2020   CHOLHDL 2.8 10/05/2020   Lab Results  Component Value Date   HGBA1C 6.0 (H) 10/05/2020   Lab Results  Component Value Date   [id number] 948 10/05/2020   Lab Results  Component Value Date   TSH 2.955 10/05/2020    10/05/20 MRI brain [I reviewed images myself and agree with interpretation. -VRP]  - No acute finding by MRI. Moderate chronic small-vessel ischemic changes of the cerebral hemispheric white matter. 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 Old small right parieto-occipital cortical infarction.    ASSESSMENT  AND PLAN  75 y.o. year old male here with numbness and tingling in the hands, lower extremities, gait difficulty, history of right knee replacement, coronary artery disease, chronic kidney disease, atrial fibrillation, dyspnea on exertion.  Patient has signs symptoms consistent with polyneuropathy including atrophy and intrinsic hand muscles.  He also has some gait difficulty related to prior knee surgery and musculoskeletal issues.  We will proceed with further work-up.  Dx:  1. Numbness and tingling in both hands      PLAN:  UPPER AND LOWER EXT WEAKNESS / NUMBNESS (polyneuropathy) - check EMG/NCS (polyneuropathy, ulnar neuropathies, CTS)  Orders Placed This Encounter  Procedures  . NCV with EMG(electromyography)   Return for for NCV/EMG.    Penni Bombard, MD 5/46/2703, 50:09 AM Certified in Neurology, Neurophysiology and Neuroimaging  [facility] [address] [place], [place] [address] [phone]

## 2020-11-19 ENCOUNTER — Telehealth (HOSPITAL_COMMUNITY): Payer: Self-pay | Admitting: Cardiology

## 2020-11-19 DIAGNOSIS — I471 Supraventricular tachycardia: Secondary | ICD-10-CM

## 2020-11-19 NOTE — Telephone Encounter (Signed)
-----   Message from Conrad Richvale, NP sent at 11/18/2020 12:14 PM EDT ----- Dr Aundra Dubin reviewed and recommends he set up an appointment with Dr Rayann Heman. Monitor with frequent SVT runs. He is already on amio 200 mg daily. Last pulse in the office 56. Please call and set up an appointment with Dr Rayann Heman.

## 2020-11-28 ENCOUNTER — Other Ambulatory Visit: Payer: Self-pay

## 2020-11-28 ENCOUNTER — Ambulatory Visit (INDEPENDENT_AMBULATORY_CARE_PROVIDER_SITE_OTHER): Payer: Medicare Other | Admitting: Diagnostic Neuroimaging

## 2020-11-28 ENCOUNTER — Encounter (INDEPENDENT_AMBULATORY_CARE_PROVIDER_SITE_OTHER): Payer: Medicare Other | Admitting: Diagnostic Neuroimaging

## 2020-11-28 DIAGNOSIS — R202 Paresthesia of skin: Secondary | ICD-10-CM | POA: Diagnosis not present

## 2020-11-28 DIAGNOSIS — Z0289 Encounter for other administrative examinations: Secondary | ICD-10-CM

## 2020-11-28 DIAGNOSIS — R200 Anesthesia of skin: Secondary | ICD-10-CM | POA: Diagnosis not present

## 2020-12-04 ENCOUNTER — Other Ambulatory Visit: Payer: Self-pay

## 2020-12-04 ENCOUNTER — Ambulatory Visit (INDEPENDENT_AMBULATORY_CARE_PROVIDER_SITE_OTHER): Payer: Medicare Other | Admitting: Internal Medicine

## 2020-12-04 ENCOUNTER — Encounter: Payer: Self-pay | Admitting: Internal Medicine

## 2020-12-04 VITALS — BP 112/60 | HR 60 | Ht 70.0 in | Wt 187.4 lb

## 2020-12-04 DIAGNOSIS — G4733 Obstructive sleep apnea (adult) (pediatric): Secondary | ICD-10-CM

## 2020-12-04 DIAGNOSIS — Z9989 Dependence on other enabling machines and devices: Secondary | ICD-10-CM | POA: Diagnosis not present

## 2020-12-04 DIAGNOSIS — I471 Supraventricular tachycardia: Secondary | ICD-10-CM

## 2020-12-04 DIAGNOSIS — Z9889 Other specified postprocedural states: Secondary | ICD-10-CM

## 2020-12-04 DIAGNOSIS — D6869 Other thrombophilia: Secondary | ICD-10-CM

## 2020-12-04 DIAGNOSIS — I480 Paroxysmal atrial fibrillation: Secondary | ICD-10-CM

## 2020-12-04 NOTE — Progress Notes (Signed)
Electrophysiology Office Note   Date:  12/04/2020   ID:  [name], DOB August 14, 1946, MRN [id number]  PCP:  [name], MD  Cardiologist:  Dr Aundra Dubin Primary Electrophysiologist: Thompson Grayer, MD    CC: atrial tachycardia   History of Present Illness: [name] is a 75 y.o. male who presents today for electrophysiology evaluation.   He is referred by Dr Aundra Dubin for EP evaluation of recent SVT. I saw him previously in 2019.  He has known Afib and severe LA enlargement.  He also has CRI, HTN, and prior MVR 2017.  His afib appears to have been reasonably controlled.   He has had longstanding difficulty with SOB and fatigue.  He had COVID 9/21.    Per Dr Oleh Genin note: "Recent hospitalization (10/04/20) for left-sided weakness and fall. CT head & MRI negative. Repeat echo EF 45-50%, LV global hypokinesis, severe LAE, mild/mod AS. Cardiology arranged for outpatient heart monitor to rule out arrhythmia causing symptoms."    He was found to have episodes of atrial tachycardia on his event monitor.  He is referred to EP for further assessment.    Past Medical History:  Diagnosis Date  . Allergic rhinitis   . Anxiety   . Atrial fibrillation ([facility])   . Cancer ([place])    hx of skin cancers   . Chronic kidney disease   . Coronary artery disease    a. s/p MI & CABG; b. s/p PCI Diag;  c. Cath 12/2011: LAD diff dzs/small, Diag 75% isr, 90 dist to stent (small), LCX & RCA occluded, VG->OM 40, VG->PDA patent - med rx.  . Depression    PTSD  . Diabetes mellitus    not on medications  . Diastolic CHF, chronic (HCC)    a. EF 55-60% by echo 2007  . GERD (gastroesophageal reflux disease)    "not anymore" " I had a lap band"  . History of diverticulitis of colon    5 YRS AGO  . History of pneumonia    2017  . History of transient ischemic attack (TIA)    CAROTID DOPPLERS NOV 2011  0-39& BIL. STENOSIS  . Hyperlipidemia   . Hypertension   . Kidney failure   . Mitral regurgitation    . Myocardial infarction ([place])   . Neuromuscular disorder ([place])    left arm numbness   . 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 PTSD (post-traumatic stress disorder)    from [place] since 1973  . 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 Shortness of breath dyspnea    with exertion  . Skin cancer   . Sleep apnea    cpap- see ov [facility] in [facility] 05/12/11 for settings   . Stroke ([place])    hx of   Past Surgical History:  Procedure Laterality Date  . AV FISTULA PLACEMENT Left 08/02/2018   Procedure: ARTERIOVENOUS (AV) FISTULA CREATION RADIOCEPHALIC;  Surgeon: Angelia Mould, MD;  Location: [facility];  Service: Vascular;  Laterality: Left;  . BLEPHAROPLASTY  1985   BILATERAL  . CARDIAC CATHETERIZATION  2001, 2004, 2009, 2011  . CARDIAC CATHETERIZATION N/A 01/23/2016   Procedure: Right/Left Heart Cath and Coronary Angiography;  Surgeon: [name], MD;  Location: [place] CV LAB;  Service: Cardiovascular;  Laterality: N/A;  . CARDIOVERSION N/A 09/07/2017   Procedure: CARDIOVERSION;  Surgeon: [name], MD;  Location: [facility] ENDOSCOPY;  Service: Cardiovascular;  Laterality: N/A;  . CARDIOVERSION N/A 09/13/2019   Procedure: CARDIOVERSION;  Surgeon: [name], MD;  Location: [facility] ENDOSCOPY;  Service: Cardiovascular;  Laterality: N/A;  . CARDIOVERSION N/A 10/19/2019   Procedure: CARDIOVERSION;  Surgeon: [name], MD;  Location: [facility] ENDOSCOPY;  Service: Cardiovascular;  Laterality: N/A;  . CATARACT EXTRACTION W/ INTRAOCULAR LENS IMPLANT Bilateral   . CHONDROPLASTY  07/22/2011   Procedure: CHONDROPLASTY;  Surgeon: Dione Plover Aluisio;  Location: [place];  Service: Orthopedics;;  . CIRCUMCISION  35 YRS  AGO  .  COLONOSCOPY    . CORONARY ANGIOPLASTY  1996-- POST CABG   W/ STENT, last cath 01/07/2012   . CORONARY ARTERY BYPASS GRAFT  1995   X3 VESSEL  . CORONARY ARTERY BYPASS GRAFT  1995  . CORONARY STENT PLACEMENT    . KNEE ARTHROSCOPY  07/22/2011   Procedure: ARTHROSCOPY KNEE;  Surgeon: [name];  Location: [place];  Service: Orthopedics;  Laterality: Right;  WITH DEBRIDEMENT   . KNEE ARTHROSCOPY W/ MENISCECTOMY  X2 IN 2002-- RIGHT KNEE  . LAPAROSCOPIC GASTRIC BANDING  01-15-09  . LEFT HEART CATH AND CORONARY ANGIOGRAPHY N/A 04/29/2017   Procedure: LEFT HEART CATH AND CORONARY ANGIOGRAPHY;  Surgeon: [name], MD;  Location: [place] CV LAB;  Service: Cardiovascular;  Laterality: N/A;  . LEFT HEART CATHETERIZATION WITH CORONARY ANGIOGRAM N/A 09/11/2013   Procedure: LEFT HEART CATHETERIZATION WITH CORONARY ANGIOGRAM;  Surgeon: [name], MD;  Location: [facility] CATH LAB;  Service: Cardiovascular;  Laterality: N/A;  . MITRAL VALVE REPAIR Right 03/25/2016   Procedure: MINIMALLY INVASIVE REOPERATION FOR MITRAL VALVE REPAIR  (MVR) with size 26 Sorin Memo 3D;  Surgeon: [name], MD;  Location: [place];  Service: Open Heart Surgery;  Laterality: Right;  . MITRAL VALVE REPAIR  03/2016  . RIGHT KNEE ED COMPARTMENT REPLACEMENT  2004  . SYNOVECTOMY  07/22/2011   Procedure: SYNOVECTOMY;  Surgeon: [name];  Location: [place];  Service: Orthopedics;;  . TEE WITHOUT CARDIOVERSION N/A 01/23/2016   Procedure: TRANSESOPHAGEAL ECHOCARDIOGRAM (TEE);  Surgeon: [name], MD;  Location: [place];  Service: Cardiovascular;  Laterality: N/A;  . TEE WITHOUT CARDIOVERSION N/A 03/25/2016   Procedure: TRANSESOPHAGEAL ECHOCARDIOGRAM (TEE);  Surgeon: [name], MD;  Location: [place];  Service: Open Heart Surgery;  Laterality: N/A;  . UMBILICAL HERNIA REPAIR  01-15-09   W/ GASTRIC BANDING PROCEDURE     Current Outpatient Medications  Medication Sig  Dispense Refill  . Alirocumab (PRALUENT) 75 MG/ML SOAJ Inject 75 mg into the skin every 14 (fourteen) days.    Marland Kitchen allopurinol (ZYLOPRIM) 100 MG tablet Take 100 mg by mouth in the morning and at bedtime.     Marland Kitchen alprostadil (EDEX) 10 MCG injection 10 mcg by Intracavitary route as needed.    Marland Kitchen amiodarone (PACERONE) 200 MG tablet Take 1 tablet (200 mg total) by mouth daily. 60 tablet 0  . apixaban (ELIQUIS) 5 MG TABS tablet Take 1 tablet (5 mg total) by mouth 2 (two) times daily. 180 tablet 3  . Artificial Saliva (MOUTH KOTE MT) Use as directed 4 sprays in the mouth or throat every 3 (three) hours as needed (for dry mouth).    . Ascorbic Acid (VITAMIN C WITH ROSE HIPS) 1000 MG tablet  Take 1,000 mg by mouth daily.    . calcitRIOL (ROCALTROL) 0.25 MCG capsule Take 0.25 mcg by mouth every other day.    . cetirizine (ZYRTEC) 10 MG tablet Take 10 mg by mouth daily.    . colchicine 0.6 MG tablet Take 0.6-1.2 mg by mouth 2 (two) times daily as needed (gout).     . furosemide (LASIX) 40 MG tablet Take 40 mg by mouth daily.    Marland Kitchen HYDROcodone-acetaminophen (NORCO/VICODIN) 5-325 MG tablet Take 1 tablet by mouth as needed for pain.    . methocarbamol (ROBAXIN) 500 MG tablet Take 1 tablet by mouth as needed for muscle spasms.    . nitroGLYCERIN (NITROSTAT) 0.4 MG SL tablet 1 TAB UNDER THE TONGUE EVERY 5 MINUTES AS NEEDED FOR CHEST PAIN UP TO 3 DOSES, IF PERSIST CALL 911 75 tablet 1  . pantoprazole (PROTONIX) 40 MG tablet Take 40 mg by mouth as needed.    . potassium chloride SA (K-DUR,KLOR-CON) 20 MEQ tablet Take 20 mEq by mouth daily.    . Semaglutide,0.25 or 0.5MG /DOS, (OZEMPIC, 0.25 OR 0.5 MG/DOSE,) 2 MG/1.5ML SOPN Inject 0.375 mLs (0.5 mg total) into the skin every Thursday. 3 pen 3  . sildenafil (VIAGRA) 100 MG tablet Take 1 tablet by mouth as needed for erectile dysfunction.    . tadalafil (CIALIS) 20 MG tablet TAKE 1 TABLET BY MOUTH ONCE DAILY AS NEEDED 10 tablet 0  . testosterone cypionate (DEPOTESTOSTERONE  CYPIONATE) 200 MG/ML injection Inject 200 mg into the muscle every 14 (fourteen) days.    . traZODone (DESYREL) 100 MG tablet Take 100 mg by mouth at bedtime.     No current facility-administered medications for this visit.    Allergies:   Crestor [rosuvastatin], Lipitor [atorvastatin], Pravastatin, Carvedilol, Metformin, Serotonin reuptake inhibitors (ssris), Zocor [simvastatin], and Codeine   Social History:  The patient  reports that he has never smoked. He has never used smokeless tobacco. He reports previous alcohol use of about 2.0 standard drinks of alcohol per week. He reports that he does not use drugs.   Family History:  The patient's  family history includes Alzheimer's disease in his mother; CVA in his father; Congestive Heart Failure in his brother; Depression in his father; Diabetes in his sister; Heart disease in his brother, father, and sister; Heart failure in his sister; Hypertension in his brother and father; Other in his mother; Stroke in his sister.    ROS:  Please see the history of present illness.   All other systems are personally reviewed and negative.    PHYSICAL EXAM: VS:  BP 112/60   Pulse 60   Ht 5\' 10"  (1.778 m)   Wt 187 lb 6.4 oz (85 kg)   SpO2 91%   BMI 26.89 kg/m  , BMI Body mass index is 26.89 kg/m. GEN: Well nourished, well developed, in no acute distress, appears older than his stated age 43: normal Neck: no JVD  Cardiac: RRR  Respiratory:    normal work of breathing GI: soft  MS: diffuse atrophy Skin: warm and dry  Neuro:  Strength and sensation are intact Psych: euthymic mood, full affect  EKG:  EKG is ordered today. The ekg ordered today is personally reviewed and shows sinus rhythm with PACs/ PVCs, first degree AV block   Recent Labs: 06/06/2020: ALT 24 10/04/2020: B Natriuretic Peptide 120.8; Hemoglobin 14.7; Magnesium 2.2; Platelets 160 10/05/2020: BUN 29; Creatinine, Ser 3.44; Potassium 3.5; Sodium 138; TSH 2.955  personally  reviewed   Lipid  Panel     Component Value Date/Time   CHOL 119 10/05/2020 0912   CHOL 136 10/14/2016 1230   TRIG 91 10/05/2020 0912   HDL 43 10/05/2020 0912   HDL 36 (L) 10/14/2016 1230   CHOLHDL 2.8 10/05/2020 0912   VLDL 18 10/05/2020 0912   LDLCALC 58 10/05/2020 0912   LDLCALC 76 10/14/2016 1230   personally reviewed   Wt Readings from Last 3 Encounters:  12/04/20 187 lb 6.4 oz (85 kg)  11/06/20 191 lb 9.6 oz (86.9 kg)  10/18/20 192 lb 9.6 oz (87.4 kg)     Other studies personally reviewed: Additional studies/ records that were reviewed today include: Dr Oleh Genin notes, prior echo, event monitor  Review of the above records today demonstrates: as above   ASSESSMENT AND PLAN:  1.  Persistent atrial fibrillation/ atrial flutter/ atrial tachycardia  He has known arrhythmias chronically in the setting of valvular heart disease and severe LA enlargement. He had MAZE in 2017 and has done much better than expected. He recently had an event monitor which documents short episodes of atrial tachycardia.  This is secondary to his atriopathy.   He is already on amiodarone.  He does not have other AAD options.  This particular arrhythmia is not amenable to ablation.  I am not convinced that this is causing him any symptoms.   He has very chronic exertional dyspnea which I believe is unlikely due to his nonsustained and short sustained atrial tachycardia events. (Longest episode was 15 minutes on his event monitor). Conservative management is advised. Continue long term [place] therapy  2. Chronic diastolic dysfunction Stable No change required today  3. HTN Stable No change required today  4. OSA Compliance with CPAP advised  Risks, benefits and potential toxicities for medications prescribed and/or refilled reviewed with patient today.    Follow-up with Dr Aundra Dubin as scheduled I will see as needed    Signed, Thompson Grayer, MD  12/04/2020 10:46 AM     [place]  [address] [place] [address] [phone] (office) [phone] (fax)

## 2020-12-04 NOTE — Patient Instructions (Addendum)
Medication Instructions:  Your physician recommends that you continue on your current medications as directed. Please refer to the Current Medication list given to you today.  Labwork: None ordered.  Testing/Procedures: None ordered.  Follow-Up: Your physician wants you to follow-up in: As needed   Any Other Special Instructions Will Be Listed Below (If Applicable).  If you need a refill on your cardiac medications before your next appointment, please call your pharmacy.

## 2020-12-05 NOTE — Procedures (Signed)
[facility]  NCS (NERVE CONDUCTION STUDY) WITH EMG (ELECTROMYOGRAPHY) REPORT   STUDY DATE: 11/28/20 PATIENT NAME: [name] DOB: May 06, 1946 MRN: [id number]  ORDERING CLINICIAN: Andrey Spearman, MD   TECHNOLOGIST: Sherre Scarlet ELECTROMYOGRAPHER: Earlean Polka. Keyah Blizard, MD  CLINICAL INFORMATION: 74 year old male with numbness.  FINDINGS: NERVE CONDUCTION STUDY:  Right ulnar, left peroneal and left tibial motor responses have decreased amplitude and slow conduction velocity.  Right median motor response is normal.  Right ulnar sensory response cannot be obtained.  Right median sensory response has prolonged peak latency and normal amplitude.  Left sural and left superficial peroneal sensory responses have decreased amplitudes.  Right radial sensory response is normal.   NEEDLE ELECTROMYOGRAPHY:  Needle examination of right upper extremity shows decreased recruitment of large motor units in right first dorsal interosseous muscle.  Right deltoid and right biceps are unremarkable.   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   INTERPRETING PHYSICIAN:  Penni Bombard, MD Certified in Neurology, Neurophysiology and Neuroimaging  [facility] [address] [place], [place] [address] [phone]   [facility]    Nerve / Sites Muscle Latency Ref. Amplitude Ref. Rel Amp Segments Distance Velocity Ref. Area    ms ms mV mV %  cm m/s m/s mVms  R Median - APB     Wrist APB 4.4 ?4.4 6.6 ?4.0 100 Wrist - APB 7   25.2     Upper arm APB 9.1  6.5  97.2 Upper arm - Wrist 23 49 ?49 24.7  R Ulnar - ADM     Wrist ADM 3.3 ?3.3 3.7 ?6.0 100 Wrist - ADM 7   11.0     B.Elbow ADM 8.0  3.3  89.4 B.Elbow - Wrist 19 40 ?49 11.0     A.Elbow ADM 11.3  2.8  83.2 A.Elbow - B.Elbow 10 31 ?49 9.1  L Peroneal - EDB     Ankle EDB 4.4 ?6.5 1.2 ?2.0 100 Ankle - EDB 9   4.4     Fib head EDB 12.5  1.2  99.9 Fib head  - Ankle 30 37 ?44 4.9     Pop fossa EDB 15.2  1.1  93.7 Pop fossa - Fib head 10 37 ?44 4.5         Pop fossa - Ankle      L Tibial - AH     Ankle AH 4.1 ?5.8 2.9 ?4.0 100 Ankle - AH 9   5.8     Pop fossa AH 15.2  2.7  93.2 Pop fossa - Ankle 40 36 ?41 6.5             SNC    Nerve / Sites Rec. Site Peak Lat Ref.  Amp Ref. Segments Distance    ms ms V V  cm  R Radial - Anatomical snuff box (Forearm)     Forearm Wrist 2.5 ?2.9 16 ?15 Forearm - Wrist 10  L Sural - Ankle (Calf)     Calf Ankle 3.8 ?4.4 2 ?6 Calf - Ankle 14  L Superficial peroneal - Ankle     Lat leg Ankle 4.2 ?4.4 3 ?6 Lat leg - Ankle 14  R Median - Orthodromic (Dig II, Mid palm)     Dig II Wrist 4.0 ?3.4 10 ?10 Dig II - Wrist 13  R Ulnar - Orthodromic, (Dig V, Mid palm)     Dig V Wrist NR ?3.1 NR ?5  Dig V - Wrist 11               F  Wave    Nerve F Lat Ref.   ms ms  L Tibial - AH 61.8 ?56.0  R Ulnar - ADM 38.1 ?32.0         EMG Summary Table    Spontaneous MUAP Recruitment  Muscle IA Fib PSW Fasc Other Amp Dur. Poly Pattern  R. Deltoid Normal None None None _______ Normal Normal Normal Normal  R. Biceps brachii Normal None None None _______ Normal Normal Normal Normal  R. First dorsal interosseous Normal None None None _______ Increased Normal Normal Reduced

## 2020-12-26 DIAGNOSIS — U071 COVID-19: Secondary | ICD-10-CM | POA: Diagnosis not present

## 2020-12-31 DIAGNOSIS — R109 Unspecified abdominal pain: Secondary | ICD-10-CM | POA: Diagnosis not present

## 2020-12-31 DIAGNOSIS — R059 Cough, unspecified: Secondary | ICD-10-CM | POA: Diagnosis not present

## 2021-01-06 ENCOUNTER — Emergency Department (HOSPITAL_BASED_OUTPATIENT_CLINIC_OR_DEPARTMENT_OTHER): Payer: No Typology Code available for payment source

## 2021-01-06 ENCOUNTER — Other Ambulatory Visit: Payer: Self-pay

## 2021-01-06 ENCOUNTER — Encounter (HOSPITAL_BASED_OUTPATIENT_CLINIC_OR_DEPARTMENT_OTHER): Payer: Self-pay

## 2021-01-06 ENCOUNTER — Inpatient Hospital Stay (HOSPITAL_BASED_OUTPATIENT_CLINIC_OR_DEPARTMENT_OTHER)
Admission: EM | Admit: 2021-01-06 | Discharge: 2021-01-10 | DRG: 659 | Disposition: A | Discharge status: Home / Self Care | Payer: No Typology Code available for payment source | Attending: Family Medicine | Admitting: Family Medicine

## 2021-01-06 DIAGNOSIS — Z818 Family history of other mental and behavioral disorders: Secondary | ICD-10-CM

## 2021-01-06 DIAGNOSIS — E1159 Type 2 diabetes mellitus with other circulatory complications: Secondary | ICD-10-CM | POA: Diagnosis not present

## 2021-01-06 DIAGNOSIS — E785 Hyperlipidemia, unspecified: Secondary | ICD-10-CM | POA: Diagnosis present

## 2021-01-06 DIAGNOSIS — L89322 Pressure ulcer of left buttock, stage 2: Secondary | ICD-10-CM | POA: Diagnosis present

## 2021-01-06 DIAGNOSIS — F4310 Post-traumatic stress disorder, unspecified: Secondary | ICD-10-CM | POA: Diagnosis present

## 2021-01-06 DIAGNOSIS — R945 Abnormal results of liver function studies: Secondary | ICD-10-CM | POA: Diagnosis not present

## 2021-01-06 DIAGNOSIS — M47816 Spondylosis without myelopathy or radiculopathy, lumbar region: Secondary | ICD-10-CM | POA: Diagnosis not present

## 2021-01-06 DIAGNOSIS — I050 Rheumatic mitral stenosis: Secondary | ICD-10-CM | POA: Diagnosis not present

## 2021-01-06 DIAGNOSIS — E559 Vitamin D deficiency, unspecified: Secondary | ICD-10-CM | POA: Diagnosis not present

## 2021-01-06 DIAGNOSIS — K7200 Acute and subacute hepatic failure without coma: Secondary | ICD-10-CM | POA: Diagnosis present

## 2021-01-06 DIAGNOSIS — N3289 Other specified disorders of bladder: Secondary | ICD-10-CM | POA: Diagnosis present

## 2021-01-06 DIAGNOSIS — Z8616 Personal history of COVID-19: Secondary | ICD-10-CM

## 2021-01-06 DIAGNOSIS — Z823 Family history of stroke: Secondary | ICD-10-CM

## 2021-01-06 DIAGNOSIS — G4733 Obstructive sleep apnea (adult) (pediatric): Secondary | ICD-10-CM | POA: Diagnosis not present

## 2021-01-06 DIAGNOSIS — N189 Chronic kidney disease, unspecified: Secondary | ICD-10-CM | POA: Diagnosis not present

## 2021-01-06 DIAGNOSIS — Z9842 Cataract extraction status, left eye: Secondary | ICD-10-CM

## 2021-01-06 DIAGNOSIS — L8990 Pressure ulcer of unspecified site, unspecified stage: Secondary | ICD-10-CM | POA: Insufficient documentation

## 2021-01-06 DIAGNOSIS — M109 Gout, unspecified: Secondary | ICD-10-CM | POA: Diagnosis present

## 2021-01-06 DIAGNOSIS — N200 Calculus of kidney: Secondary | ICD-10-CM | POA: Diagnosis not present

## 2021-01-06 DIAGNOSIS — I5042 Chronic combined systolic (congestive) and diastolic (congestive) heart failure: Secondary | ICD-10-CM | POA: Diagnosis present

## 2021-01-06 DIAGNOSIS — Z9889 Other specified postprocedural states: Secondary | ICD-10-CM

## 2021-01-06 DIAGNOSIS — Z952 Presence of prosthetic heart valve: Secondary | ICD-10-CM

## 2021-01-06 DIAGNOSIS — R296 Repeated falls: Secondary | ICD-10-CM | POA: Diagnosis not present

## 2021-01-06 DIAGNOSIS — Z85828 Personal history of other malignant neoplasm of skin: Secondary | ICD-10-CM

## 2021-01-06 DIAGNOSIS — N179 Acute kidney failure, unspecified: Principal | ICD-10-CM | POA: Diagnosis present

## 2021-01-06 DIAGNOSIS — G4730 Sleep apnea, unspecified: Secondary | ICD-10-CM | POA: Diagnosis present

## 2021-01-06 DIAGNOSIS — J309 Allergic rhinitis, unspecified: Secondary | ICD-10-CM | POA: Diagnosis present

## 2021-01-06 DIAGNOSIS — G319 Degenerative disease of nervous system, unspecified: Secondary | ICD-10-CM | POA: Diagnosis not present

## 2021-01-06 DIAGNOSIS — Z951 Presence of aortocoronary bypass graft: Secondary | ICD-10-CM | POA: Diagnosis not present

## 2021-01-06 DIAGNOSIS — N184 Chronic kidney disease, stage 4 (severe): Secondary | ICD-10-CM | POA: Diagnosis not present

## 2021-01-06 DIAGNOSIS — R531 Weakness: Secondary | ICD-10-CM | POA: Diagnosis not present

## 2021-01-06 DIAGNOSIS — E119 Type 2 diabetes mellitus without complications: Secondary | ICD-10-CM | POA: Diagnosis present

## 2021-01-06 DIAGNOSIS — F32A Depression, unspecified: Secondary | ICD-10-CM | POA: Diagnosis present

## 2021-01-06 DIAGNOSIS — I2510 Atherosclerotic heart disease of native coronary artery without angina pectoris: Secondary | ICD-10-CM | POA: Diagnosis not present

## 2021-01-06 DIAGNOSIS — R638 Other symptoms and signs concerning food and fluid intake: Secondary | ICD-10-CM | POA: Diagnosis not present

## 2021-01-06 DIAGNOSIS — Z955 Presence of coronary angioplasty implant and graft: Secondary | ICD-10-CM

## 2021-01-06 DIAGNOSIS — E1165 Type 2 diabetes mellitus with hyperglycemia: Secondary | ICD-10-CM | POA: Diagnosis not present

## 2021-01-06 DIAGNOSIS — Z7901 Long term (current) use of anticoagulants: Secondary | ICD-10-CM

## 2021-01-06 DIAGNOSIS — R748 Abnormal levels of other serum enzymes: Secondary | ICD-10-CM

## 2021-01-06 DIAGNOSIS — K219 Gastro-esophageal reflux disease without esophagitis: Secondary | ICD-10-CM | POA: Diagnosis present

## 2021-01-06 DIAGNOSIS — Z885 Allergy status to narcotic agent status: Secondary | ICD-10-CM

## 2021-01-06 DIAGNOSIS — N132 Hydronephrosis with renal and ureteral calculous obstruction: Secondary | ICD-10-CM | POA: Diagnosis not present

## 2021-01-06 DIAGNOSIS — E1122 Type 2 diabetes mellitus with diabetic chronic kidney disease: Secondary | ICD-10-CM | POA: Diagnosis not present

## 2021-01-06 DIAGNOSIS — Z961 Presence of intraocular lens: Secondary | ICD-10-CM | POA: Diagnosis present

## 2021-01-06 DIAGNOSIS — F419 Anxiety disorder, unspecified: Secondary | ICD-10-CM | POA: Diagnosis present

## 2021-01-06 DIAGNOSIS — I130 Hypertensive heart and chronic kidney disease with heart failure and stage 1 through stage 4 chronic kidney disease, or unspecified chronic kidney disease: Secondary | ICD-10-CM | POA: Diagnosis present

## 2021-01-06 DIAGNOSIS — R7989 Other specified abnormal findings of blood chemistry: Secondary | ICD-10-CM | POA: Diagnosis not present

## 2021-01-06 DIAGNOSIS — I10 Essential (primary) hypertension: Secondary | ICD-10-CM | POA: Diagnosis present

## 2021-01-06 DIAGNOSIS — E860 Dehydration: Secondary | ICD-10-CM | POA: Diagnosis not present

## 2021-01-06 DIAGNOSIS — I252 Old myocardial infarction: Secondary | ICD-10-CM

## 2021-01-06 DIAGNOSIS — R82998 Other abnormal findings in urine: Secondary | ICD-10-CM | POA: Diagnosis not present

## 2021-01-06 DIAGNOSIS — Z20822 Contact with and (suspected) exposure to covid-19: Secondary | ICD-10-CM | POA: Diagnosis present

## 2021-01-06 DIAGNOSIS — Z888 Allergy status to other drugs, medicaments and biological substances status: Secondary | ICD-10-CM

## 2021-01-06 DIAGNOSIS — Z8249 Family history of ischemic heart disease and other diseases of the circulatory system: Secondary | ICD-10-CM

## 2021-01-06 DIAGNOSIS — K8020 Calculus of gallbladder without cholecystitis without obstruction: Secondary | ICD-10-CM | POA: Diagnosis not present

## 2021-01-06 DIAGNOSIS — I5030 Unspecified diastolic (congestive) heart failure: Secondary | ICD-10-CM | POA: Diagnosis not present

## 2021-01-06 DIAGNOSIS — N1330 Unspecified hydronephrosis: Secondary | ICD-10-CM | POA: Diagnosis not present

## 2021-01-06 DIAGNOSIS — Z833 Family history of diabetes mellitus: Secondary | ICD-10-CM

## 2021-01-06 DIAGNOSIS — U071 COVID-19: Secondary | ICD-10-CM | POA: Diagnosis not present

## 2021-01-06 DIAGNOSIS — I5022 Chronic systolic (congestive) heart failure: Secondary | ICD-10-CM | POA: Diagnosis not present

## 2021-01-06 DIAGNOSIS — Z8673 Personal history of transient ischemic attack (TIA), and cerebral infarction without residual deficits: Secondary | ICD-10-CM

## 2021-01-06 DIAGNOSIS — Z9841 Cataract extraction status, right eye: Secondary | ICD-10-CM

## 2021-01-06 DIAGNOSIS — I517 Cardiomegaly: Secondary | ICD-10-CM | POA: Diagnosis not present

## 2021-01-06 DIAGNOSIS — R5383 Other fatigue: Secondary | ICD-10-CM | POA: Diagnosis not present

## 2021-01-06 DIAGNOSIS — I6782 Cerebral ischemia: Secondary | ICD-10-CM | POA: Diagnosis not present

## 2021-01-06 DIAGNOSIS — R059 Cough, unspecified: Secondary | ICD-10-CM | POA: Diagnosis not present

## 2021-01-06 DIAGNOSIS — I25810 Atherosclerosis of coronary artery bypass graft(s) without angina pectoris: Secondary | ICD-10-CM | POA: Diagnosis present

## 2021-01-06 LAB — COMPREHENSIVE METABOLIC PANEL
ALT: 35 U/L (ref 0–44)
AST: 50 U/L — ABNORMAL HIGH (ref 15–41)
Albumin: 3.5 g/dL (ref 3.5–5.0)
Alkaline Phosphatase: 66 U/L (ref 38–126)
Anion gap: 14 (ref 5–15)
BUN: 61 mg/dL — ABNORMAL HIGH (ref 8–23)
CO2: 22 mmol/L (ref 22–32)
Calcium: 9.6 mg/dL (ref 8.9–10.3)
Chloride: 95 mmol/L — ABNORMAL LOW (ref 98–111)
Creatinine, Ser: 6.09 mg/dL — ABNORMAL HIGH (ref 0.61–1.24)
GFR, Estimated: 9 mL/min — ABNORMAL LOW (ref >60)
Glucose, Bld: 126 mg/dL — ABNORMAL HIGH (ref 70–99)
Potassium: 5 mmol/L (ref 3.5–5.1)
Sodium: 131 mmol/L — ABNORMAL LOW (ref 135–145)
Total Bilirubin: 1.6 mg/dL — ABNORMAL HIGH (ref 0.3–1.2)
Total Protein: 6.6 g/dL (ref 6.5–8.1)

## 2021-01-06 LAB — CBC WITH DIFFERENTIAL/PLATELET
Abs Immature Granulocytes: 0.08 10*3/uL — ABNORMAL HIGH (ref 0.00–0.07)
Basophils Absolute: 0.1 10*3/uL (ref 0.0–0.1)
Basophils Relative: 1 %
Eosinophils Absolute: 0.1 10*3/uL (ref 0.0–0.5)
Eosinophils Relative: 1 %
HCT: 37.8 % — ABNORMAL LOW (ref 39.0–52.0)
Hemoglobin: 13.2 g/dL (ref 13.0–17.0)
Immature Granulocytes: 1 %
Lymphocytes Relative: 9 %
Lymphs Abs: 0.7 10*3/uL (ref 0.7–4.0)
MCH: 34.2 pg — ABNORMAL HIGH (ref 26.0–34.0)
MCHC: 34.9 g/dL (ref 30.0–36.0)
MCV: 97.9 fL (ref 80.0–100.0)
Monocytes Absolute: 0.7 10*3/uL (ref 0.1–1.0)
Monocytes Relative: 8 %
Neutro Abs: 6.8 10*3/uL (ref 1.7–7.7)
Neutrophils Relative %: 80 %
Platelets: 206 10*3/uL (ref 150–400)
RBC: 3.86 MIL/uL — ABNORMAL LOW (ref 4.22–5.81)
RDW: 15.2 % (ref 11.5–15.5)
WBC: 8.5 10*3/uL (ref 4.0–10.5)
nRBC: 0 % (ref 0.0–0.2)

## 2021-01-06 LAB — URINALYSIS, ROUTINE W REFLEX MICROSCOPIC
Bilirubin Urine: NEGATIVE
Glucose, UA: NEGATIVE mg/dL
Ketones, ur: NEGATIVE mg/dL
Leukocytes,Ua: NEGATIVE
Nitrite: NEGATIVE
Protein, ur: NEGATIVE mg/dL
Specific Gravity, Urine: 1.01 (ref 1.005–1.030)
pH: 6 (ref 5.0–8.0)

## 2021-01-06 LAB — URINALYSIS, MICROSCOPIC (REFLEX)

## 2021-01-06 LAB — RESP PANEL BY RT-PCR (FLU A&B, COVID) ARPGX2
Influenza A by PCR: NEGATIVE
Influenza B by PCR: NEGATIVE
SARS Coronavirus 2 by RT PCR: NEGATIVE

## 2021-01-06 MED ORDER — ACETAMINOPHEN 650 MG RE SUPP: 650.0000 mg | Freq: Four times a day (QID) | RECTAL | Status: DC | PRN

## 2021-01-06 MED ORDER — ALLOPURINOL 100 MG PO TABS
50.0000 mg | ORAL_TABLET | ORAL | Status: DC
  Administered 2021-01-06 – 2021-01-09 (×2): 50 mg via ORAL
  Filled 2021-01-06 (×2): qty 1

## 2021-01-06 MED ORDER — SODIUM CHLORIDE 0.9 % IV SOLN: INTRAVENOUS | Status: AC

## 2021-01-06 MED ORDER — ACETAMINOPHEN 325 MG PO TABS: 650.0000 mg | ORAL_TABLET | Freq: Four times a day (QID) | ORAL | Status: DC | PRN

## 2021-01-06 MED ORDER — TRAZODONE HCL 100 MG PO TABS
100.0000 mg | ORAL_TABLET | Freq: Every day | ORAL | Status: DC
  Administered 2021-01-06 – 2021-01-09 (×4): 100 mg via ORAL
  Filled 2021-01-06 (×4): qty 1

## 2021-01-06 MED ORDER — APIXABAN 2.5 MG PO TABS
2.5000 mg | ORAL_TABLET | Freq: Two times a day (BID) | ORAL | Status: DC
  Administered 2021-01-06 – 2021-01-07 (×2): 2.5 mg via ORAL
  Filled 2021-01-06 (×2): qty 1

## 2021-01-06 MED ORDER — SODIUM CHLORIDE 0.9 % IV BOLUS
500.0000 mL | Freq: Once | INTRAVENOUS | Status: AC
  Administered 2021-01-06: 500 mL via INTRAVENOUS

## 2021-01-06 MED ORDER — INSULIN ASPART 100 UNIT/ML IJ SOLN
0.0000 [IU] | Freq: Three times a day (TID) | INTRAMUSCULAR | Status: DC
  Administered 2021-01-07: 1 [IU] via SUBCUTANEOUS
  Administered 2021-01-07: 2 [IU] via SUBCUTANEOUS

## 2021-01-06 NOTE — H&P (Signed)
History and Physical    Jason CATALA [name]:[id number] DOB: October 17, 1945 DOA: 01/06/2021  PCP: [name], MD  Patient coming from: Home.  Chief Complaint: Weakness.  HPI: [name] is a 75 y.o. male with history of CAD status post CABG, minimally invasive mitral valve replacement atrial fibrillation, chronic and disease stage IV baseline creatinine around 3.4 was recently diagnosed with COVID infection about 11 days ago and since then patient has been having poor appetite and has not been eating well.  Denies any nausea or vomiting.  Has been feeling weak and fatigued.  Lab work showed worsening creatinine and patient presents to the ER.  ED Course: In the ER patient was afebrile and lab work showed creatinine of 6.09 which increased from 3.4 about 3 months ago.  EKG shows normal sinus rhythm.  COVID test is negative.  Patient was given fluid bolus admitted for acute on chronic kidney disease stage IV.  Review of Systems: As per HPI, rest all negative.   Past Medical History:  Diagnosis Date  . Allergic rhinitis   . Anxiety   . Atrial fibrillation ([place])   . Cancer ([place])    hx of skin cancers   . Chronic kidney disease   . Coronary artery disease    a. s/p MI & CABG; b. s/p PCI Diag;  c. Cath 12/2011: LAD diff dzs/small, Diag 75% isr, 90 dist to stent (small), LCX & RCA occluded, VG->OM 40, VG->PDA patent - med rx.  . Depression    PTSD  . Diabetes mellitus    not on medications  . Diastolic CHF, chronic (HCC)    a. EF 55-60% by echo 2007  . GERD (gastroesophageal reflux disease)    "not anymore" " I had a lap band"  . History of diverticulitis of colon    5 YRS AGO  . History of pneumonia    2017  . History of transient ischemic attack (TIA)    CAROTID DOPPLERS NOV 2011  0-39& BIL. STENOSIS  . Hyperlipidemia   . Hypertension   . Kidney failure   . Mitral regurgitation   . Myocardial infarction ([place])   . Neuromuscular disorder ([place])    left arm numbness   .  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 PTSD (post-traumatic stress disorder)    from [place] since 1973  . 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 Shortness of breath dyspnea    with exertion  . Skin cancer   . Sleep apnea    cpap- see ov note in [facility] 05/12/11 for settings   . Stroke ([place])    hx of    Past Surgical History:  Procedure Laterality Date  . AV FISTULA PLACEMENT Left 08/02/2018   Procedure: ARTERIOVENOUS (AV) FISTULA CREATION RADIOCEPHALIC;  Surgeon: Angelia Mould, MD;  Location: [place];  Service: Vascular;  Laterality: Left;  . BLEPHAROPLASTY  1985   BILATERAL  . CARDIAC CATHETERIZATION  2001, 2004, 2009, 2011  . CARDIAC CATHETERIZATION N/A 01/23/2016   Procedure: Right/Left Heart Cath and Coronary Angiography;  Surgeon: [name], MD;  Location: [place] CV LAB;  Service: Cardiovascular;  Laterality: N/A;  . CARDIOVERSION N/A 09/07/2017   Procedure: CARDIOVERSION;  Surgeon: [name], MD;  Location:  [place] ENDOSCOPY;  Service: Cardiovascular;  Laterality: N/A;  . CARDIOVERSION N/A 09/13/2019   Procedure: CARDIOVERSION;  Surgeon: [name], MD;  Location: [facility] ENDOSCOPY;  Service: Cardiovascular;  Laterality: N/A;  . CARDIOVERSION N/A 10/19/2019   Procedure: CARDIOVERSION;  Surgeon: [name], MD;  Location[facility];  Service: Cardiovascular;  Laterality: N/A;  . CATARACT EXTRACTION W/ INTRAOCULAR LENS IMPLANT Bilateral   . CHONDROPLASTY  07/22/2011   Procedure: CHONDROPLASTY;  Surgeon: Dione Plover Aluisio;  Location: [place];  Service: Orthopedics;;  . CIRCUMCISION  35 YRS  AGO  . COLONOSCOPY    . CORONARY ANGIOPLASTY  1996-- POST CABG   W/ STENT, last cath 01/07/2012   .  CORONARY ARTERY BYPASS GRAFT  1995   X3 VESSEL  . CORONARY ARTERY BYPASS GRAFT  1995  . CORONARY STENT PLACEMENT    . KNEE ARTHROSCOPY  07/22/2011   Procedure: ARTHROSCOPY KNEE;  Surgeon: [name];  Location: [place];  Service: Orthopedics;  Laterality: Right;  WITH DEBRIDEMENT   . KNEE ARTHROSCOPY W/ MENISCECTOMY  X2 IN 2002-- RIGHT KNEE  . LAPAROSCOPIC GASTRIC BANDING  01-15-09  . LEFT HEART CATH AND CORONARY ANGIOGRAPHY N/A 04/29/2017   Procedure: LEFT HEART CATH AND CORONARY ANGIOGRAPHY;  Surgeon: [name], MD;  Location: [place] CV LAB;  Service: Cardiovascular;  Laterality: N/A;  . LEFT HEART CATHETERIZATION WITH CORONARY ANGIOGRAM N/A 09/11/2013   Procedure: LEFT HEART CATHETERIZATION WITH CORONARY ANGIOGRAM;  Surgeon: [name], MD;  Location: [facility] CATH LAB;  Service: Cardiovascular;  Laterality: N/A;  . MITRAL VALVE REPAIR Right 03/25/2016   Procedure: MINIMALLY INVASIVE REOPERATION FOR MITRAL VALVE REPAIR  (MVR) with size 26 Sorin Memo 3D;  Surgeon: [name], MD;  Location: [place];  Service: Open Heart Surgery;  Laterality: Right;  . MITRAL VALVE REPAIR  03/2016  . RIGHT KNEE ED COMPARTMENT REPLACEMENT  2004  . SYNOVECTOMY  07/22/2011   Procedure: SYNOVECTOMY;  Surgeon: [name];  Location: [place];  Service: Orthopedics;;  . TEE WITHOUT CARDIOVERSION N/A 01/23/2016   Procedure: TRANSESOPHAGEAL ECHOCARDIOGRAM (TEE);  Surgeon: [name], MD;  Location: [place];  Service: Cardiovascular;  Laterality: N/A;  . TEE WITHOUT CARDIOVERSION N/A 03/25/2016   Procedure: TRANSESOPHAGEAL ECHOCARDIOGRAM (TEE);  Surgeon: [name], MD;  Location: [place];  Service: Open Heart Surgery;  Laterality: N/A;  . UMBILICAL HERNIA REPAIR  01-15-09   W/ GASTRIC BANDING PROCEDURE     reports that he has never smoked. He has never used smokeless tobacco. He reports previous alcohol use of about 2.0 standard drinks of alcohol  per week. He reports that he does not use drugs.  Allergies  Allergen Reactions  . Crestor [Rosuvastatin] Other (See Comments)    Muscle aches   . Lipitor [Atorvastatin] Other (See Comments)    Muscle aches  . Pravastatin Other (See Comments)    Muscle aches  . Carvedilol     Other reaction(s): loss of appetite  . Metformin Diarrhea  . Serotonin Reuptake Inhibitors (Ssris)     Other reaction(s): Muscle aches  . Zocor [Simvastatin]     Other reaction(s): Muscle pain  . Codeine Itching and Other (See Comments)    Extremity tingling-- can take synthetic    Family History  Problem Relation Age of Onset  . Other Mother        joint problems  . Alzheimer's disease Mother   . Hypertension Father   .  Heart disease Father   . CVA Father        age 60  . Depression Father   . Heart disease Sister        CABG  . Stroke Sister   . Heart failure Sister        congestive  . Diabetes Sister   . Heart disease Brother   . Hypertension Brother   . Congestive Heart Failure Brother     Prior to Admission medications   Medication Sig Start Date End Date Taking? Authorizing Provider  Alirocumab (PRALUENT) 75 MG/ML SOAJ Inject 75 mg into the skin every 14 (fourteen) days.   Yes [provider]  allopurinol (ZYLOPRIM) 100 MG tablet Take 100 mg by mouth in the morning and at bedtime.  07/03/19  Yes [provider]  amiodarone (PACERONE) 200 MG tablet Take 1 tablet (200 mg total) by mouth daily. Patient taking differently: Take 200 mg by mouth 2 (two) times daily. 10/19/19  Yes [name], MD  amoxicillin-clavulanate (AUGMENTIN) 875-125 MG tablet Take 1 tablet by mouth 2 (two) times daily. Start date ; 01/02/11 12/31/20  Yes [provider]  apixaban (ELIQUIS) 5 MG TABS tablet Take 1 tablet (5 mg total) by mouth 2 (two) times daily. 09/09/17  Yes [name], MD  Artificial Saliva (MOUTH KOTE MT) Use as directed 4 sprays in the mouth or throat every 3  (three) hours as needed (for dry mouth).   Yes [provider]  Ascorbic Acid (VITAMIN C WITH ROSE HIPS) 1000 MG tablet Take 1,000 mg by mouth daily.   Yes [provider]  calcitRIOL (ROCALTROL) 0.25 MCG capsule Take 0.25 mcg by mouth every other day.   Yes [provider]  Carboxymethylcellulose Sodium 1 % GEL Place 1 drop into both eyes daily as needed (dry eyes). 12/06/20  Yes [provider]  cetirizine (ZYRTEC) 10 MG tablet Take 10 mg by mouth daily.   Yes [provider]  colchicine 0.6 MG tablet Take 0.6-1.2 mg by mouth 2 (two) times daily as needed (gout).  04/14/19  Yes [provider]  fluticasone (FLONASE) 50 MCG/ACT nasal spray Place 1 spray into both nostrils daily as needed for allergies or rhinitis.   Yes [provider]  furosemide (LASIX) 40 MG tablet Take 40 mg by mouth daily.   Yes [provider]  HYDROcodone-acetaminophen (NORCO/VICODIN) 5-325 MG tablet Take 1 tablet by mouth daily as needed for severe pain.   Yes [provider]  nitroGLYCERIN (NITROSTAT) 0.4 MG SL tablet 1 TAB UNDER THE TONGUE EVERY 5 MINUTES AS NEEDED FOR CHEST PAIN UP TO 3 DOSES, IF PERSIST CALL 911 Patient taking differently: Place 0.4 mg under the tongue every 5 (five) minutes as needed for chest pain. 03/05/20  Yes [name], MD  pantoprazole (PROTONIX) 40 MG tablet Take 40 mg by mouth daily as needed (heartburn).   Yes [provider]  potassium chloride SA (K-DUR,KLOR-CON) 20 MEQ tablet Take 20 mEq by mouth daily.   Yes [provider]  Semaglutide,0.25 or 0.5MG /DOS, (OZEMPIC, 0.25 OR 0.5 MG/DOSE,) 2 MG/1.5ML SOPN Inject 0.375 mLs (0.5 mg total) into the skin every Thursday. 02/01/20  Yes Philemon Kingdom, MD  sildenafil (VIAGRA) 100 MG tablet Take 1 tablet by mouth daily as needed for erectile dysfunction. 11/19/20  Yes [provider]  tadalafil (CIALIS) 20 MG tablet TAKE 1 TABLET BY MOUTH  ONCE DAILY AS NEEDED Patient taking differently: Take 20 mg  by mouth daily as needed for erectile dysfunction. 01/08/20  Yes McKenzie, Candee Furbish, MD  testosterone cypionate (DEPOTESTOSTERONE CYPIONATE) 200 MG/ML injection Inject 200 mg into the muscle every 14 (fourteen) days. 09/23/20  Yes [provider]  traZODone (DESYREL) 100 MG tablet Take 100 mg by mouth at bedtime.   Yes [provider]  alprostadil (EDEX) 10 MCG injection 10 mcg by Intracavitary route daily as needed for erectile dysfunction. 11/20/20   [provider]    Physical Exam: Constitutional: Moderately built and nourished. Vitals:   01/06/21 1645 01/06/21 1730 01/06/21 1815 01/06/21 2017  BP: (!) 149/75 (!) 153/80 139/74 139/78  Pulse: 63 66 69 (!) 53  Resp: 15 18 17 18   Temp:    98.2 F (36.8 C)  TempSrc:    Oral  SpO2: 99% 100% 99% 100%  Weight:      Height:       Eyes: Anicteric no pallor. ENMT: No discharge from the ears eyes nose and mouth. Neck: No mass felt.  No neck rigidity. Respiratory: No rhonchi or crepitations. Cardiovascular: S1-S2 heard. Abdomen: Soft nontender bowel sounds present. Musculoskeletal: No edema. Skin: No rash. Neurologic: Alert awake oriented to time place and person.  Moves all extremities. Psychiatric: Appears normal.  Normal affect.   Labs on Admission: I have personally reviewed following labs and imaging studies  CBC: Recent Labs  Lab 01/06/21 1456  WBC 8.5  NEUTROABS 6.8  HGB 13.2  HCT 37.8*  MCV 97.9  PLT 353   Basic Metabolic Panel: Recent Labs  Lab 01/06/21 1456  NA 131*  K 5.0  CL 95*  CO2 22  GLUCOSE 126*  BUN 61*  CREATININE 6.09*  CALCIUM 9.6   GFR: Estimated Creatinine Clearance: 10.8 mL/min (A) (by C-G formula based on SCr of 6.09 mg/dL (H)). Liver Function Tests: Recent Labs  Lab 01/06/21 1456  AST 50*  ALT 35  ALKPHOS 66  BILITOT 1.6*  PROT 6.6  ALBUMIN 3.5   No results for input(s): LIPASE, AMYLASE in the  last 168 hours. No results for input(s): AMMONIA in the last 168 hours. Coagulation Profile: No results for input(s): INR, PROTIME in the last 168 hours. Cardiac Enzymes: No results for input(s): CKTOTAL, CKMB, CKMBINDEX, TROPONINI in the last 168 hours. BNP (last 3 results) No results for input(s): PROBNP in the last 8760 hours. HbA1C: No results for input(s): HGBA1C in the last 72 hours. CBG: No results for input(s): GLUCAP in the last 168 hours. Lipid Profile: No results for input(s): CHOL, HDL, LDLCALC, TRIG, CHOLHDL, LDLDIRECT in the last 72 hours. Thyroid Function Tests: No results for input(s): TSH, T4TOTAL, FREET4, T3FREE, THYROIDAB in the last 72 hours. Anemia Panel: No results for input(s): VITAMINB12, FOLATE, FERRITIN, TIBC, IRON, RETICCTPCT in the last 72 hours. Urine analysis:    Component Value Date/Time   COLORURINE YELLOW 01/06/2021 1456   APPEARANCEUR HAZY (A) 01/06/2021 1456   LABSPEC 1.010 01/06/2021 1456   PHURINE 6.0 01/06/2021 1456   GLUCOSEU NEGATIVE 01/06/2021 1456   HGBUR LARGE (A) 01/06/2021 1456   BILIRUBINUR NEGATIVE 01/06/2021 1456   KETONESUR NEGATIVE 01/06/2021 1456   PROTEINUR NEGATIVE 01/06/2021 1456   UROBILINOGEN 0.2 04/04/2015 1529   NITRITE NEGATIVE 01/06/2021 1456   LEUKOCYTESUR NEGATIVE 01/06/2021 1456   Sepsis Labs: @LABRCNTIP (procalcitonin:4,lacticidven:4) ) Recent Results (from the past 240 hour(s))  Resp Panel by RT-PCR (Flu A&B, Covid) Nasopharyngeal Swab     Status: None   Collection Time: 01/06/21  4:35 PM  Specimen: Nasopharyngeal Swab; Nasopharyngeal(NP) swabs in vial transport medium  Result Value Ref Range Status   SARS Coronavirus 2 by RT PCR NEGATIVE NEGATIVE Final    Comment: (NOTE) SARS-CoV-2 target nucleic acids are NOT DETECTED.  The SARS-CoV-2 RNA is generally detectable in upper respiratory specimens during the acute phase of infection. The lowest concentration of SARS-CoV-2 viral copies this assay can detect  is 138 copies/mL. A negative result does not preclude SARS-Cov-2 infection and should not be used as the sole basis for treatment or other patient management decisions. A negative result may occur with  improper specimen collection/handling, submission of specimen other than nasopharyngeal swab, presence of viral mutation(s) within the areas targeted by this assay, and inadequate number of viral copies(<138 copies/mL). A negative result must be combined with clinical observations, patient history, and epidemiological information. The expected result is Negative.  Fact Sheet for Patients:  EntrepreneurPulse.com.au  Fact Sheet for Healthcare Providers:  IncredibleEmployment.be  This test is no t yet approved or cleared by the [place] FDA and  has been authorized for detection and/or diagnosis of SARS-CoV-2 by FDA under an Emergency Use Authorization (EUA). This EUA will remain  in effect (meaning this test can be used) for the duration of the COVID-19 declaration under Section 564(b)(1) of the Act, 21 U.S.C.section 360bbb-3(b)(1), unless the authorization is terminated  or revoked sooner.       Influenza A by PCR NEGATIVE NEGATIVE Final   Influenza B by PCR NEGATIVE NEGATIVE Final    Comment: (NOTE) The Xpert Xpress SARS-CoV-2/FLU/RSV plus assay is intended as an aid in the diagnosis of influenza from Nasopharyngeal swab specimens and should not be used as a sole basis for treatment. Nasal washings and aspirates are unacceptable for Xpert Xpress SARS-CoV-2/FLU/RSV testing.  Fact Sheet for Patients: EntrepreneurPulse.com.au  Fact Sheet for Healthcare Providers: IncredibleEmployment.be  This test is not yet approved or cleared by the [place] FDA and has been authorized for detection and/or diagnosis of SARS-CoV-2 by FDA under an Emergency Use Authorization (EUA). This EUA will remain in effect  (meaning this test can be used) for the duration of the COVID-19 declaration under Section 564(b)(1) of the Act, 21 U.S.C. section 360bbb-3(b)(1), unless the authorization is terminated or revoked.  Performed at [facility], [address] [place], [place] [address]      Radiological Exams on Admission: CT Head Wo Contrast  Result Date: 01/06/2021 CLINICAL DATA:  Increasing falls, initial encounter EXAM: CT HEAD WITHOUT CONTRAST TECHNIQUE: Contiguous axial images were obtained from the base of the skull through the vertex without intravenous contrast. COMPARISON:  10/04/2020 FINDINGS: Brain: No evidence of acute infarction, hemorrhage, hydrocephalus, extra-axial collection or mass lesion/mass effect. Mild atrophic and ischemic changes are noted. Vascular: No hyperdense vessel or unexpected calcification. Skull: Normal. Negative for fracture or focal lesion. Sinuses/Orbits: No acute finding. Other: None. IMPRESSION: Mild atrophic and ischemic changes stable from the prior exam. No acute abnormality noted. Electronically Signed   By: Inez Catalina M.D.   On: 01/06/2021 16:04   DG Chest Portable 1 View  Result Date: 01/06/2021 CLINICAL DATA:  COVID positive with cough EXAM: PORTABLE CHEST 1 VIEW COMPARISON:  12/31/2020 FINDINGS: Post sternotomy changes. No focal opacity or pleural effusion. Stable cardiomegaly. No pneumothorax IMPRESSION: No active disease. Electronically Signed   By: Donavan Foil M.D.   On: 01/06/2021 15:28    EKG: Independently reviewed.  Normal sinus rhythm.  LBBB.  Assessment/Plan Principal Problem:   AKI (  acute kidney injury) ([place]) Active Problems:   Poorly controlled type 2 diabetes mellitus with circulatory disorder (HCC)   Essential hypertension   CAD, AUTOLOGOUS BYPASS GRAFT   Obstructive sleep apnea   S/P minimally invasive mitral valve repair   Generalized weakness   ARF (acute renal failure) ([place])    1. Acute on chronic kidney disease stage IV  likely from poor oral intake.  We will hold Lasix and gently hydrate.  UA does show some RBCs but no casts or protein.  Discussed with Dr. Hollie Salk nephrologist. 2. History of chronic diastolic CHF with mitral valve replacement we will hold Lasix due to renal failure. 3. Recurrent COVID infection.  Presently asymptomatic. 4. A. fib on Eliquis dose of which will be monitored by pharmacy.  Continue amiodarone. 5. History of gout on allopurinol dose of which will be monitored by pharmacist. 6. CAD status post CABG denies any chest pain. 7. Diabetes mellitus sliding scale coverage.  Takes Ozempic at home.  Since patient has significantly worsened kidney function with history of CHF will need close monitoring and inpatient status.  DVT prophylaxis: Apixaban. Code Status: Full code. Family Communication: Discussed with patient. Disposition Plan: Home. Consults called: Discussed with her nephrologist. Admission status: Inpatient.   Rise Patience MD [facility] Pager [phone].  If 7PM-7AM, please contact night-coverage [email or web address] Password [facility]  01/06/2021, 11:20 PM

## 2021-01-06 NOTE — ED Notes (Signed)
Pt Vitals Cycling and Pt is on the Cardiac Monitor

## 2021-01-06 NOTE — ED Notes (Signed)
Report called to Sarah RN receiving nurse at [employer]

## 2021-01-06 NOTE — Progress Notes (Signed)
PMH: Remote CABG, systolic CHF (53-31%, PAH, DM-2, A. fib on Eliquis, OSA, HTN, HLD, CKD-4, depression, recent COVID-19 infection (positive on 5/1) and hypogonadism presented to [facility] ED with progressive weakness, fatigue and decreased oral intake that has acutely gotten worse over the last 2 days.  HDS.  Afebrile.  Normal sats on RA. Cr/BUN 6.09/61 (baseline about 3.5). Na 131.  Total bili 1.9.  CBC without significant finding.  UA with large Hgb.  CXR and CT head without acute finding.  COVID-19 and influenza PCR pending.  Received NS bolus 500 cc.  Being admitted for AKI on CKD-4.  Admitting to telemetry.  May need nephrology consult on arrival.

## 2021-01-06 NOTE — ED Notes (Signed)
Report given to David RN with [facility]  

## 2021-01-06 NOTE — ED Provider Notes (Signed)
[facility] Provider Note   CSN: [id number] Arrival date & time: 01/06/21  1222     History Chief Complaint  Patient presents with  . Covid Positive    [name] is a 75 y.o. male past history of CHF (EF 45-50%), GERD, A. fib (on Eliquis), hyperlipidemia, hypertension who presents for evaluation of generalized weakness, fatigue.  He reports that he tested positive for COVID on 5/1.  He has had vaccine x2+1 booster.  He states that throughout the last few weeks, he has felt nauseous and has not had any appetite.  He states sometimes his abdomen will hurt and other times it will be fine.  He states he is just not felt like eating and states he has not eaten much in the last 2 weeks.  He states that he does have some mild cough and shortness of breath but feels that it is no worse.  He states the main thing is that he just feels so tired and weak.  He states that if he gets up and walks around, he feels like he has no energy.  He has not had any vomiting.  His last bowel movement was yesterday.  He is still passing flatus.  He denies any numbness, chest pain.  He also states he has fallen over the last few days.  He is not sure if he hit his head.  The history is provided by the patient.       Past Medical History:  Diagnosis Date  . Allergic rhinitis   . Anxiety   . Atrial fibrillation ([place])   . Cancer ([place])    hx of skin cancers   . Chronic kidney disease   . Coronary artery disease    a. s/p MI & CABG; b. s/p PCI Diag;  c. Cath 12/2011: LAD diff dzs/small, Diag 75% isr, 90 dist to stent (small), LCX & RCA occluded, VG->OM 40, VG->PDA patent - med rx.  . Depression    PTSD  . Diabetes mellitus    not on medications  . Diastolic CHF, chronic (HCC)    a. EF 55-60% by echo 2007  . GERD (gastroesophageal reflux disease)    "not anymore" " I had a lap band"  . History of diverticulitis of colon    5 YRS AGO  . History of pneumonia    2017  .  History of transient ischemic attack (TIA)    CAROTID DOPPLERS NOV 2011  0-39& BIL. STENOSIS  . Hyperlipidemia   . Hypertension   . Kidney failure   . Mitral regurgitation   . Myocardial infarction ([place])   . Neuromuscular disorder ([place])    left arm numbness   . 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 PTSD (post-traumatic stress disorder)    from [place] since 1973  . 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 Shortness of breath dyspnea    with exertion  . Skin cancer   . Sleep apnea    cpap- see ov [facility] in [facility] 05/12/11 for settings   . Stroke [facility])    hx of    Patient Active Problem List   Diagnosis Date Noted  . AKI (acute kidney injury) ([place]) 01/06/2021  . Dyspnea 10/05/2020  . CHF (  congestive heart failure) ([place]) 10/04/2020  . COVID-19 virus infection 05/24/2020  . Chronic kidney disease   . Coronary artery disease   . Type II diabetes mellitus ([place])   . Hypertension   . Cancer ([place])   . Encounter for therapeutic drug monitoring 04/03/2016  . Pneumothorax on right 03/29/2016  . 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/25/2016  . Atherosclerotic heart disease of native coronary artery without angina pectoris 03/10/2016  . Heart failure ([place]) 03/10/2016  . Cataract extraction status of eye 03/10/2016  . Other long term (current) drug therapy 03/10/2016  . Osteoarthritis of knee 03/10/2016  . Vitamin D deficiency 03/10/2016  . Mitral regurgitation   . Avitaminosis D 12/25/2015  . 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 Arthritis of knee, degenerative 12/25/2015  . H/O cataract extraction 12/25/2015  . Arteriosclerosis of coronary artery 12/25/2015  . Congestive heart failure ([place]) 12/25/2015  . Pure hypercholesterolemia  12/25/2015  . Allergic rhinitis 12/25/2015  . Essential (primary) hypertension 12/25/2015  . Hypotension 01/29/2014  . Expected Blood loss, postoperative 04/29/2012  . Pain due to unicompartmental arthroplasty of knee ([place]) 04/27/2012  . Preoperative evaluation of a medical condition to rule out surgical contraindications (TAR required) 03/31/2012  . Diastolic CHF, chronic ([place]) 06/29/2011  . Preoperative evaluation to rule out surgical contraindication 06/29/2011  . CAROTID ARTERY STENOSIS 07/09/2010  . TIA 05/29/2010  . FATIGUE / MALAISE 05/29/2010  . CAD, AUTOLOGOUS BYPASS GRAFT 11/15/2008  . MYOCARDIAL INFARCTION 12/18/2007  . ALLERGIC RHINITIS WITH CONJUNCTIVITIS 12/18/2007  . Poorly controlled type 2 diabetes mellitus with circulatory disorder ([place]) 12/05/2007  . Hyperlipidemia 12/05/2007  . OBESITY 12/05/2007  . Essential hypertension 12/05/2007  . Coronary atherosclerosis 12/05/2007  . DEGENERATIVE JOINT DISEASE 12/05/2007  . Obstructive sleep apnea 12/05/2007    Past Surgical History:  Procedure Laterality Date  . AV FISTULA PLACEMENT Left 08/02/2018   Procedure: ARTERIOVENOUS (AV) FISTULA CREATION RADIOCEPHALIC;  Surgeon: Angelia Mould, MD;  Location: [place];  Service: Vascular;  Laterality: Left;  . BLEPHAROPLASTY  1985   BILATERAL  . CARDIAC CATHETERIZATION  2001, 2004, 2009, 2011  . CARDIAC CATHETERIZATION N/A 01/23/2016   Procedure: Right/Left Heart Cath and Coronary Angiography;  Surgeon: [name], MD;  Location: [place] CV LAB;  Service: Cardiovascular;  Laterality: N/A;  . CARDIOVERSION N/A 09/07/2017   Procedure: CARDIOVERSION;  Surgeon: [name], MD;  Location: [facility] ENDOSCOPY;  Service: Cardiovascular;  Laterality: N/A;  . CARDIOVERSION N/A 09/13/2019   Procedure: CARDIOVERSION;  Surgeon: [name], MD;  Location: [facility] ENDOSCOPY;  Service: Cardiovascular;  Laterality: N/A;  . CARDIOVERSION N/A 10/19/2019   Procedure: CARDIOVERSION;   Surgeon: [name], MD;  Location: [facility] ENDOSCOPY;  Service: Cardiovascular;  Laterality: N/A;  . CATARACT EXTRACTION W/ INTRAOCULAR LENS IMPLANT Bilateral   . CHONDROPLASTY  07/22/2011   Procedure: CHONDROPLASTY;  Surgeon: Dione Plover Aluisio;  Location: [place];  Service: Orthopedics;;  . CIRCUMCISION  35 YRS  AGO  . COLONOSCOPY    . CORONARY ANGIOPLASTY  1996-- POST CABG   W/ STENT, last cath 01/07/2012   . CORONARY ARTERY BYPASS GRAFT  1995   X3 VESSEL  . CORONARY ARTERY BYPASS GRAFT  1995  . CORONARY STENT PLACEMENT    . KNEE ARTHROSCOPY  07/22/2011   Procedure: ARTHROSCOPY KNEE;  Surgeon: [name];  Location: [place];  Service: Orthopedics;  Laterality: Right;  WITH DEBRIDEMENT   . KNEE ARTHROSCOPY W/ MENISCECTOMY  X2 IN  2002-- RIGHT KNEE  . LAPAROSCOPIC GASTRIC BANDING  01-15-09  . LEFT HEART CATH AND CORONARY ANGIOGRAPHY N/A 04/29/2017   Procedure: LEFT HEART CATH AND CORONARY ANGIOGRAPHY;  Surgeon: [name], MD;  Location: [place] CV LAB;  Service: Cardiovascular;  Laterality: N/A;  . LEFT HEART CATHETERIZATION WITH CORONARY ANGIOGRAM N/A 09/11/2013   Procedure: LEFT HEART CATHETERIZATION WITH CORONARY ANGIOGRAM;  Surgeon: [name], MD;  Location: [facility] CATH LAB;  Service: Cardiovascular;  Laterality: N/A;  . MITRAL VALVE REPAIR Right 03/25/2016   Procedure: MINIMALLY INVASIVE REOPERATION FOR MITRAL VALVE REPAIR  (MVR) with size 26 Sorin Memo 3D;  Surgeon: [name], MD;  Location: [place];  Service: Open Heart Surgery;  Laterality: Right;  . MITRAL VALVE REPAIR  03/2016  . RIGHT KNEE ED COMPARTMENT REPLACEMENT  2004  . SYNOVECTOMY  07/22/2011   Procedure: SYNOVECTOMY;  Surgeon: [name];  Location: [place];  Service: Orthopedics;;  . TEE WITHOUT CARDIOVERSION N/A 01/23/2016   Procedure: TRANSESOPHAGEAL ECHOCARDIOGRAM (TEE);  Surgeon: [name], MD;  Location: [place];  Service:  Cardiovascular;  Laterality: N/A;  . TEE WITHOUT CARDIOVERSION N/A 03/25/2016   Procedure: TRANSESOPHAGEAL ECHOCARDIOGRAM (TEE);  Surgeon: [name], MD;  Location: [place];  Service: Open Heart Surgery;  Laterality: N/A;  . UMBILICAL HERNIA REPAIR  01-15-09   W/ GASTRIC BANDING PROCEDURE       Family History  Problem Relation Age of Onset  . Other Mother        joint problems  . Alzheimer's disease Mother   . Hypertension Father   . Heart disease Father   . CVA Father        age 36  . Depression Father   . Heart disease Sister        CABG  . Stroke Sister   . Heart failure Sister        congestive  . Diabetes Sister   . Heart disease Brother   . Hypertension Brother   . Congestive Heart Failure Brother     Social History   Tobacco Use  . Smoking status: Never Smoker  . Smokeless tobacco: Never Used  Vaping Use  . Vaping Use: Never used  Substance Use Topics  . Alcohol use: Not Currently    Alcohol/week: 2.0 standard drinks    Types: 1 Glasses of wine, 1 Shots of liquor per week    Comment: OCCASIONAL  . Drug use: Never    Home Medications Prior to Admission medications   Medication Sig Start Date End Date Taking? Authorizing Provider  apixaban (ELIQUIS) 5 MG TABS tablet Take 1 tablet (5 mg total) by mouth 2 (two) times daily. 09/09/17  Yes [name], MD  Alirocumab (PRALUENT) 75 MG/ML SOAJ Inject 75 mg into the skin every 14 (fourteen) days.    [provider]  allopurinol (ZYLOPRIM) 100 MG tablet Take 100 mg by mouth in the morning and at bedtime.  07/03/19   [provider]  alprostadil (EDEX) 10 MCG injection 10 mcg by Intracavitary route as needed. 11/20/20   [provider]  amiodarone (PACERONE) 200 MG tablet Take 1 tablet (200 mg total) by mouth daily. 10/19/19   [name], MD  Artificial Saliva (MOUTH KOTE MT) Use as directed 4 sprays in the mouth or throat every 3 (three) hours as needed (for dry mouth).     [provider]  Ascorbic Acid (VITAMIN C WITH ROSE HIPS) 1000  MG tablet Take 1,000 mg by mouth daily.    [provider]  calcitRIOL (ROCALTROL) 0.25 MCG capsule Take 0.25 mcg by mouth every other day.    [provider]  cetirizine (ZYRTEC) 10 MG tablet Take 10 mg by mouth daily.    [provider]  colchicine 0.6 MG tablet Take 0.6-1.2 mg by mouth 2 (two) times daily as needed (gout).  04/14/19   [provider]  furosemide (LASIX) 40 MG tablet Take 40 mg by mouth daily.    [provider]  HYDROcodone-acetaminophen (NORCO/VICODIN) 5-325 MG tablet Take 1 tablet by mouth as needed for pain.    [provider]  methocarbamol (ROBAXIN) 500 MG tablet Take 1 tablet by mouth as needed for muscle spasms. 07/31/20   [provider]  nitroGLYCERIN (NITROSTAT) 0.4 MG SL tablet 1 TAB UNDER THE TONGUE EVERY 5 MINUTES AS NEEDED FOR CHEST PAIN UP TO 3 DOSES, IF PERSIST CALL 911 03/05/20   [name], MD  pantoprazole (PROTONIX) 40 MG tablet Take 40 mg by mouth as needed.    [provider]  potassium chloride SA (K-DUR,KLOR-CON) 20 MEQ tablet Take 20 mEq by mouth daily.    [provider]  Semaglutide,0.25 or 0.5MG /DOS, (OZEMPIC, 0.25 OR 0.5 MG/DOSE,) 2 MG/1.5ML SOPN Inject 0.375 mLs (0.5 mg total) into the skin every Thursday. 02/01/20   Philemon Kingdom, MD  sildenafil (VIAGRA) 100 MG tablet Take 1 tablet by mouth as needed for erectile dysfunction. 11/19/20   [provider]  tadalafil (CIALIS) 20 MG tablet TAKE 1 TABLET BY MOUTH ONCE DAILY AS NEEDED 01/08/20   McKenzie, Candee Furbish, MD  testosterone cypionate (DEPOTESTOSTERONE CYPIONATE) 200 MG/ML injection Inject 200 mg into the muscle every 14 (fourteen) days. 09/23/20   [provider]  traZODone (DESYREL) 100 MG tablet Take 100 mg by mouth at bedtime.    [provider]    Allergies    Crestor [rosuvastatin], Lipitor [atorvastatin],  Pravastatin, Carvedilol, Metformin, Serotonin reuptake inhibitors (ssris), Zocor [simvastatin], and Codeine  Review of Systems   Review of Systems  Constitutional: Positive for activity change, appetite change and fatigue. Negative for diaphoresis and fever.  Respiratory: Positive for cough. Negative for shortness of breath.   Cardiovascular: Negative for chest pain.  Gastrointestinal: Negative for abdominal pain, nausea and vomiting.  Genitourinary: Negative for dysuria and hematuria.  Neurological: Positive for weakness (generalized). Negative for headaches.  All other systems reviewed and are negative.   Physical Exam Updated Vital Signs BP (!) 151/76 (BP Location: Right Arm)   Pulse 64   Temp 98.1 F (36.7 C) (Oral)   Resp 15   Ht 5\' 10"  (1.778 m)   Wt 80.7 kg   SpO2 98%   BMI 25.54 kg/m   Physical Exam Vitals and nursing note reviewed.  Constitutional:      Appearance: Normal appearance. He is well-developed.  HENT:     Head: Normocephalic and atraumatic.     Comments: No tenderness to palpation of skull. No deformities or crepitus noted. No open wounds, abrasions or lacerations.  Eyes:     General: Lids are normal.     Conjunctiva/sclera: Conjunctivae normal.     Pupils: Pupils are equal, round, and reactive to light.  Neck:     Comments: Full flexion/extension and lateral movement of neck fully intact. No bony midline tenderness. No deformities or crepitus.  Cardiovascular:     Rate and Rhythm: Normal rate and regular rhythm.  Pulses: Normal pulses.     Heart sounds: Normal heart sounds. No murmur heard. No friction rub. No gallop.   Pulmonary:     Effort: Pulmonary effort is normal.     Breath sounds: Normal breath sounds.     Comments: Lungs clear to auscultation bilaterally.  Symmetric chest rise.  No wheezing, rales, rhonchi. Abdominal:     Palpations: Abdomen is soft. Abdomen is not rigid.     Tenderness: There is no abdominal tenderness. There is no  guarding.     Comments: Abdomen is soft, non-distended, non-tender. No rigidity, No guarding. No peritoneal signs.  Musculoskeletal:        General: Normal range of motion.     Cervical back: Full passive range of motion without pain.     Comments: No midline T or L spine tenderness. No deformity or step off noted.   Skin:    General: Skin is warm and dry.     Capillary Refill: Capillary refill takes less than 2 seconds.  Neurological:     Mental Status: He is alert and oriented to person, place, and time.     Comments: Follows commands, Moves all extremities  5/5 strength to BUE and BLE  Sensation intact throughout all major nerve distributions No gait abnormalities  No slurred speech. No facial droop.   Psychiatric:        Speech: Speech normal.     ED Results / Procedures / Treatments   Labs (all labs ordered are listed, but only abnormal results are displayed) Labs Reviewed  COMPREHENSIVE METABOLIC PANEL - Abnormal; Notable for the following components:      Result Value   Sodium 131 (*)    Chloride 95 (*)    Glucose, Bld 126 (*)    BUN 61 (*)    Creatinine, Ser 6.09 (*)    AST 50 (*)    Total Bilirubin 1.6 (*)    GFR, Estimated 9 (*)    All other components within normal limits  CBC WITH DIFFERENTIAL/PLATELET - Abnormal; Notable for the following components:   RBC 3.86 (*)    HCT 37.8 (*)    MCH 34.2 (*)    Abs Immature Granulocytes 0.08 (*)    All other components within normal limits  URINALYSIS, ROUTINE W REFLEX MICROSCOPIC - Abnormal; Notable for the following components:   APPearance HAZY (*)    Hgb urine dipstick LARGE (*)    All other components within normal limits  URINALYSIS, MICROSCOPIC (REFLEX) - Abnormal; Notable for the following components:   Bacteria, UA FEW (*)    All other components within normal limits  RESP PANEL BY RT-PCR (FLU A&B, COVID) [id number]    EKG EKG Interpretation  Date/Time:  Monday Jan 06 2021 14:02:24 EDT Ventricular Rate:   58 PR Interval:  199 QRS Duration: 177 QT Interval:  552 QTC Calculation: 543 R Axis:   -67 Text Interpretation: Sinus rhythm RBBB and LAFB Confirmed by Aletta Edouard [phone]) on 01/06/2021 2:23:22 PM   Radiology CT Head Wo Contrast  Result Date: 01/06/2021 CLINICAL DATA:  Increasing falls, initial encounter EXAM: CT HEAD WITHOUT CONTRAST TECHNIQUE: Contiguous axial images were obtained from the base of the skull through the vertex without intravenous contrast. COMPARISON:  10/04/2020 FINDINGS: Brain: No evidence of acute infarction, hemorrhage, hydrocephalus, extra-axial collection or mass lesion/mass effect. Mild atrophic and ischemic changes are noted. Vascular: No hyperdense vessel or unexpected calcification. Skull: Normal. Negative for fracture or focal lesion. Sinuses/Orbits: No acute finding.  Other: None. IMPRESSION: Mild atrophic and ischemic changes stable from the prior exam. No acute abnormality noted. Electronically Signed   By: Inez Catalina M.D.   On: 01/06/2021 16:04   DG Chest Portable 1 View  Result Date: 01/06/2021 CLINICAL DATA:  COVID positive with cough EXAM: PORTABLE CHEST 1 VIEW COMPARISON:  12/31/2020 FINDINGS: Post sternotomy changes. No focal opacity or pleural effusion. Stable cardiomegaly. No pneumothorax IMPRESSION: No active disease. Electronically Signed   By: Donavan Foil M.D.   On: 01/06/2021 15:28    Procedures Procedures   Medications Ordered in ED Medications  sodium chloride 0.9 % bolus 500 mL (0 mLs Intravenous Stopped 01/06/21 1635)    ED Course  I have reviewed the triage vital signs and the nursing notes.  Pertinent labs & imaging results that were available during my care of the patient were reviewed by me and considered in my medical decision making (see chart for details).    MDM Rules/Calculators/A&P                          75 year old male who presents for evaluation of generalized weakness, fatigue, decreased appetite.  Reports  recently tested positive for COVID a few weeks ago.  Since then has not really wanted to eat much.  States he has had some mild shortness of breath cough that is not his main concern.  He did a telehealth visit earlier today and was prompted to the emergency department for further evaluation.  On initial arrival, he is afebrile, toxic appearing.  Vitals are stable.  Patient with no focal weakness.  Suspect this may be electrolyte imbalance versus dehydration.  Plan check labs.  Additionally, he tells me he is fallen and is unsure if he hit his head.  He is on Eliquis, will obtain imaging of his head. Given reassuring exam and NEXUS criteria, no indication for cervical imaging at this time.   Chest x-ray shows no active disease.  UA shows large hemoglobin.  No evidence of infectious etiology. He does have RBCs.  CMP shows potassium of 5, BUN of 61, creatinine of 6.09.  He has a history of CKD but his baseline creatinine is normally in the 3.  CBC shows no leukocytosis.  Hemoglobin is 13.2.  CT shows mild atrophic and ischemic changes stable from prior exam.  I suspect his AKI is prerenal as he has not been eating and drinking.  He does not have any abdominal pain. Given AKI as well as generalized weakness, will plan for admission.  Discussed patient with Dr. Cyndia Skeeters (hospitalist) who accepts patient for admission.   Updated patient on plan. He is agreeable.   Portions of this note were generated with [employer]. Dictation errors may occur despite best attempts at proofreading.  Final Clinical Impression(s) / ED Diagnoses Final diagnoses:  AKI (acute kidney injury) ([place])  Generalized weakness    Rx / DC Orders ED Discharge Orders    None       Desma Mcgregor 01/06/21 1720    Arnaldo Natal, MD 01/06/21 2329

## 2021-01-06 NOTE — ED Notes (Signed)
Patient transported to CT 

## 2021-01-06 NOTE — ED Triage Notes (Signed)
COVID + on 5/1, continued c/o weakness, SOB, productive cough, and increased falls at home.  Wife here with same c/o.  Denies any CP.  Continued fevers at home.

## 2021-01-06 NOTE — ED Notes (Signed)
Given snacks and sprite

## 2021-01-07 ENCOUNTER — Inpatient Hospital Stay (HOSPITAL_COMMUNITY): Payer: No Typology Code available for payment source

## 2021-01-07 DIAGNOSIS — R531 Weakness: Secondary | ICD-10-CM

## 2021-01-07 DIAGNOSIS — Z9889 Other specified postprocedural states: Secondary | ICD-10-CM

## 2021-01-07 DIAGNOSIS — I5022 Chronic systolic (congestive) heart failure: Secondary | ICD-10-CM

## 2021-01-07 DIAGNOSIS — N179 Acute kidney failure, unspecified: Secondary | ICD-10-CM | POA: Diagnosis not present

## 2021-01-07 DIAGNOSIS — R638 Other symptoms and signs concerning food and fluid intake: Secondary | ICD-10-CM

## 2021-01-07 DIAGNOSIS — R7989 Other specified abnormal findings of blood chemistry: Secondary | ICD-10-CM

## 2021-01-07 DIAGNOSIS — Z951 Presence of aortocoronary bypass graft: Secondary | ICD-10-CM

## 2021-01-07 DIAGNOSIS — I050 Rheumatic mitral stenosis: Secondary | ICD-10-CM

## 2021-01-07 DIAGNOSIS — L8990 Pressure ulcer of unspecified site, unspecified stage: Secondary | ICD-10-CM | POA: Insufficient documentation

## 2021-01-07 DIAGNOSIS — G4733 Obstructive sleep apnea (adult) (pediatric): Secondary | ICD-10-CM

## 2021-01-07 DIAGNOSIS — R5383 Other fatigue: Secondary | ICD-10-CM

## 2021-01-07 LAB — CBC
HCT: 33.8 % — ABNORMAL LOW (ref 39.0–52.0)
Hemoglobin: 11.6 g/dL — ABNORMAL LOW (ref 13.0–17.0)
MCH: 33.6 pg (ref 26.0–34.0)
MCHC: 34.3 g/dL (ref 30.0–36.0)
MCV: 98 fL (ref 80.0–100.0)
Platelets: 223 10*3/uL (ref 150–400)
RBC: 3.45 MIL/uL — ABNORMAL LOW (ref 4.22–5.81)
RDW: 14.7 % (ref 11.5–15.5)
WBC: 8 10*3/uL (ref 4.0–10.5)
nRBC: 0 % (ref 0.0–0.2)

## 2021-01-07 LAB — COMPREHENSIVE METABOLIC PANEL
ALT: 46 U/L — ABNORMAL HIGH (ref 0–44)
AST: 39 U/L (ref 15–41)
Albumin: 3.2 g/dL — ABNORMAL LOW (ref 3.5–5.0)
Alkaline Phosphatase: 60 U/L (ref 38–126)
Anion gap: 9 (ref 5–15)
BUN: 62 mg/dL — ABNORMAL HIGH (ref 8–23)
CO2: 25 mmol/L (ref 22–32)
Calcium: 9.3 mg/dL (ref 8.9–10.3)
Chloride: 99 mmol/L (ref 98–111)
Creatinine, Ser: 6.3 mg/dL — ABNORMAL HIGH (ref 0.61–1.24)
GFR, Estimated: 9 mL/min — ABNORMAL LOW (ref >60)
Glucose, Bld: 110 mg/dL — ABNORMAL HIGH (ref 70–99)
Potassium: 4.4 mmol/L (ref 3.5–5.1)
Sodium: 133 mmol/L — ABNORMAL LOW (ref 135–145)
Total Bilirubin: 1.1 mg/dL (ref 0.3–1.2)
Total Protein: 5.4 g/dL — ABNORMAL LOW (ref 6.5–8.1)

## 2021-01-07 LAB — GLUCOSE, CAPILLARY
Glucose-Capillary: 151 mg/dL — ABNORMAL HIGH (ref 70–99)
Glucose-Capillary: 128 mg/dL — ABNORMAL HIGH (ref 70–99)
Glucose-Capillary: 149 mg/dL — ABNORMAL HIGH (ref 70–99)
Glucose-Capillary: 177 mg/dL — ABNORMAL HIGH (ref 70–99)

## 2021-01-07 LAB — HEMOGLOBIN A1C
Hgb A1c MFr Bld: 6.3 % — ABNORMAL HIGH (ref 4.8–5.6)
Mean Plasma Glucose: 134.11 mg/dL

## 2021-01-07 LAB — CK: Total CK: 34 U/L — ABNORMAL LOW (ref 49–397)

## 2021-01-07 LAB — URIC ACID: Uric Acid, Serum: 4.3 mg/dL (ref 3.7–8.6)

## 2021-01-07 MED ORDER — AMIODARONE HCL 200 MG PO TABS
200.0000 mg | ORAL_TABLET | Freq: Every day | ORAL | Status: DC
  Administered 2021-01-07 – 2021-01-10 (×4): 200 mg via ORAL
  Filled 2021-01-07 (×4): qty 1

## 2021-01-07 MED ORDER — HYDROCODONE-ACETAMINOPHEN 5-325 MG PO TABS
1.0000 | ORAL_TABLET | Freq: Every day | ORAL | Status: DC | PRN
  Administered 2021-01-07 – 2021-01-09 (×2): 1 via ORAL
  Filled 2021-01-07 (×2): qty 1

## 2021-01-07 MED ORDER — HEPARIN SODIUM (PORCINE) 5000 UNIT/ML IJ SOLN
5000.0000 [IU] | Freq: Three times a day (TID) | INTRAMUSCULAR | Status: DC
  Administered 2021-01-07 – 2021-01-10 (×7): 5000 [IU] via SUBCUTANEOUS
  Filled 2021-01-07 (×7): qty 1

## 2021-01-07 MED ORDER — INSULIN ASPART 100 UNIT/ML IJ SOLN
0.0000 [IU] | Freq: Every day | INTRAMUSCULAR | Status: DC
  Administered 2021-01-08: 2 [IU] via SUBCUTANEOUS

## 2021-01-07 MED ORDER — CALCITRIOL 0.25 MCG PO CAPS
0.2500 ug | ORAL_CAPSULE | ORAL | Status: DC
  Administered 2021-01-07: 0.25 ug via ORAL
  Filled 2021-01-07: qty 1

## 2021-01-07 MED ORDER — INSULIN ASPART 100 UNIT/ML IJ SOLN
0.0000 [IU] | Freq: Three times a day (TID) | INTRAMUSCULAR | Status: DC
  Administered 2021-01-07 – 2021-01-08 (×2): 1 [IU] via SUBCUTANEOUS
  Administered 2021-01-09: 3 [IU] via SUBCUTANEOUS
  Administered 2021-01-09 (×2): 2 [IU] via SUBCUTANEOUS
  Administered 2021-01-10: 1 [IU] via SUBCUTANEOUS

## 2021-01-07 NOTE — Progress Notes (Signed)
PROGRESS NOTE  [name] JOI:[id number] DOB: Nov 06, 1945   PCP: Jason Cruel, MD  Patient is from: Home  DOA: 01/06/2021 LOS: 1  Chief complaints: Generalized weakness  Brief Narrative / Interim history: 75 year old M with PMH of CAD with remote CABG, systolic CHF (47-09%), DM-2, A. fib on Eliquis, OSA, HTN, HLD, CKD-4 followed by [name], depression, recent COVID-19 infection (positive on 5/1) and hypogonadism presented to [facility] ED with progressive weakness, fatigue and decreased oral intake that has acutely gotten worse over for 3 to 4 days prior to admission.   He was  HDS.  Afebrile.  Normal sats on RA. Cr/BUN 6.09/61 (baseline about 3.5). Na 131.  Total bili 1.9.  CBC without significant finding.  UA with large Hgb.  CXR and CT head without acute finding.  COVID-19 and influenza PCR negative.  Received NS bolus 500 cc, and started on IV fluid infusion and admitted for AKI on CKD-4 with azotemia.  Subjective: Seen and examined earlier this morning.  No major events overnight of this morning.  No complaints.  Feels better this morning.  He says he walked in the hallway felt stronger.  He denies chest pain, shortness of breath, GI or UTI symptoms.  He denies NSAID use.   Objective: Vitals:   01/06/21 2349 01/07/21 0442 01/07/21 0506 01/07/21 0902  BP: 132/75  111/60 130/77  Pulse: 61  62 67  Resp: 18  18 18   Temp: 98.1 F (36.7 C)   98.7 F (37.1 C)  TempSrc: Oral   Oral  SpO2: 100%  99% 98%  Weight:  81.1 kg 80.9 kg   Height:        Intake/Output Summary (Last 24 hours) at 01/07/2021 1217 Last data filed at 01/07/2021 0635 Gross per 24 hour  Intake 1109.84 ml  Output 1825 ml  Net -715.16 ml   Filed Weights   01/06/21 1253 01/07/21 0442 01/07/21 0506  Weight: 80.7 kg 81.1 kg 80.9 kg    Examination:  GENERAL: No apparent distress.  Nontoxic. HEENT: MMM.  Vision and hearing grossly intact.  NECK: Supple.  No apparent JVD.  RESP: On RA.  No IWOB.  Fair  aeration bilaterally. CVS:  RRR. Heart sounds normal.  ABD/GI/GU: BS+. Abd soft, NTND.  MSK/EXT:  Moves extremities. No apparent deformity. No edema.  SKIN: no apparent skin lesion or wound NEURO: Awake, alert and oriented appropriately.  No apparent focal neuro deficit. PSYCH: Calm. Normal affect.   Procedures:  None  Microbiology summarized: [id number] and influenza PCR nonreactive.  Assessment & Plan: AKI on CKD-4/azotemia: Likely in the setting of poor p.o. intake from recent COVID-19 infection and concurrent use of diuretics.  He could have ATN from COVID infection as well.  He denies NSAIDs.  UA with large Hgb (seems chronic).  And 21-50 RBC.  Denies UTI symptoms. Recent Labs    03/07/20 1046 06/06/20 1537 10/04/20 1331 10/05/20 0310 01/06/21 1456 01/07/21 0433  BUN 36* 41* 33* 29* 61* 62*  CREATININE 3.66* 2.81* 3.54* 3.44* 6.09* 6.30*  -Continue IV fluid -Renal ultrasound, uric acid, CK -Needs repeat UA outpatient  -Admitted discussed with nephrologist, [name] on arrival. -Closely monitor urine output  Hyponatremia: Improving.  Likely hypovolemic. -Continue IV fluid  Chronic systolic CHF: TTE in 04/4764 with LVEF of 45 to 50%, global hypokinesis indeterminate DD severe LAE and moderate MVR.  Patient appears euvolemic on exam.  No cardiopulmonary symptoms. -Continue holding Lasix -Monitor fluid and respiratory status.  CAD  with remote CABG: No anginal symptoms. -Not on medication.  Controlled NIDDM-2 with hyperglycemia: A1c 6.0% in 09/2020. Recent Labs  Lab 01/07/21 0756 01/07/21 1137  GLUCAP 151* 128*  -Recheck hemoglobin A1c -Continue SSI   OSA on CPAP -Continue nightly CPAP  Paroxysmal A. fib on Eliquis -Continue home amiodarone and Eliquis  Essential hypertension? Normotensive.  Does not seem to be on medication for this.  Hyperlipidemia -Continue home medications.  Depression: Stable -Continue home medications.  Hypogonadism: Seems to be a  testosterone injection biweekly.  Last injection in October 2021.  Does not explain patient's acute weakness. -Outpatient follow-up  Recent COVID-19 infection: Reportedly tested positive on 5/1 but did not require hospitalization.  COVID-19 PCR negative on admission. -No indication for airborne or contact precaution  Generalized weakness-likely in the setting of poor p.o. intake, AKI and recent COVID-19 infection.  Improving. -PT/OT eval     Body mass index is 25.58 kg/m.         DVT prophylaxis:  apixaban (ELIQUIS) tablet 2.5 mg Start: 01/07/21 0015 apixaban (ELIQUIS) tablet 2.5 mg  Code Status: Full code Family Communication: Updated patient's wife over the phone. Level of care: Telemetry Status is: Inpatient  Remains inpatient appropriate because:Ongoing diagnostic testing needed not appropriate for outpatient work up, IV treatments appropriate due to intensity of illness or inability to take PO and Inpatient level of care appropriate due to severity of illness   Dispo: The patient is from: Home              Anticipated d/c is to: Home              Patient currently is not medically stable to d/c.   Difficult to place patient No       Consultants:  Nephrology   Sch Meds:  Scheduled Meds: . allopurinol  50 mg Oral Once per day on Mon Thu  . amiodarone  200 mg Oral Daily  . apixaban  2.5 mg Oral BID  . calcitRIOL  0.25 mcg Oral QODAY  . insulin aspart  0-9 Units Subcutaneous TID WC  . traZODone  100 mg Oral QHS   Continuous Infusions: . sodium chloride 100 mL/hr at 01/07/21 0319   PRN Meds:.acetaminophen **OR** acetaminophen, HYDROcodone-acetaminophen  Antimicrobials: Anti-infectives (From admission, onward)   None       I have personally reviewed the following labs and images: CBC: Recent Labs  Lab 01/06/21 1456 01/07/21 0433  WBC 8.5 8.0  NEUTROABS 6.8  --   HGB 13.2 11.6*  HCT 37.8* 33.8*  MCV 97.9 98.0  PLT 206 223   BMP &GFR Recent  Labs  Lab 01/06/21 1456 01/07/21 0433  NA 131* 133*  K 5.0 4.4  CL 95* 99  CO2 22 25  GLUCOSE 126* 110*  BUN 61* 62*  CREATININE 6.09* 6.30*  CALCIUM 9.6 9.3   Estimated Creatinine Clearance: 10.5 mL/min (A) (by C-G formula based on SCr of 6.3 mg/dL (H)). Liver & Pancreas: Recent Labs  Lab 01/06/21 1456 01/07/21 0433  AST 50* 39  ALT 35 46*  ALKPHOS 66 60  BILITOT 1.6* 1.1  PROT 6.6 5.4*  ALBUMIN 3.5 3.2*   No results for input(s): LIPASE, AMYLASE in the last 168 hours. No results for input(s): AMMONIA in the last 168 hours. Diabetic: No results for input(s): HGBA1C in the last 72 hours. Recent Labs  Lab 01/07/21 0756 01/07/21 1137  GLUCAP 151* 128*   Cardiac Enzymes: No results for input(s): CKTOTAL, CKMB, CKMBINDEX,  TROPONINI in the last 168 hours. No results for input(s): PROBNP in the last 8760 hours. Coagulation Profile: No results for input(s): INR, PROTIME in the last 168 hours. Thyroid Function Tests: No results for input(s): TSH, T4TOTAL, FREET4, T3FREE, THYROIDAB in the last 72 hours. Lipid Profile: No results for input(s): CHOL, HDL, LDLCALC, TRIG, CHOLHDL, LDLDIRECT in the last 72 hours. Anemia Panel: No results for input(s): VITAMINB12, FOLATE, FERRITIN, TIBC, IRON, RETICCTPCT in the last 72 hours. Urine analysis:    Component Value Date/Time   COLORURINE YELLOW 01/06/2021 1456   APPEARANCEUR HAZY (A) 01/06/2021 1456   LABSPEC 1.010 01/06/2021 1456   PHURINE 6.0 01/06/2021 1456   GLUCOSEU NEGATIVE 01/06/2021 1456   HGBUR LARGE (A) 01/06/2021 1456   BILIRUBINUR NEGATIVE 01/06/2021 1456   KETONESUR NEGATIVE 01/06/2021 1456   PROTEINUR NEGATIVE 01/06/2021 1456   UROBILINOGEN 0.2 04/04/2015 1529   NITRITE NEGATIVE 01/06/2021 1456   LEUKOCYTESUR NEGATIVE 01/06/2021 1456   Sepsis Labs: Invalid input(s): PROCALCITONIN, [place]  Microbiology: Recent Results (from the past 240 hour(s))  Resp Panel by RT-PCR (Flu A&B, Covid) Nasopharyngeal  Swab     Status: None   Collection Time: 01/06/21  4:35 PM   Specimen: Nasopharyngeal Swab; Nasopharyngeal(NP) swabs in vial transport medium  Result Value Ref Range Status   SARS Coronavirus 2 by RT PCR NEGATIVE NEGATIVE Final    Comment: (NOTE) SARS-CoV-2 target nucleic acids are NOT DETECTED.  The SARS-CoV-2 RNA is generally detectable in upper respiratory specimens during the acute phase of infection. The lowest concentration of SARS-CoV-2 viral copies this assay can detect is 138 copies/mL. A negative result does not preclude SARS-Cov-2 infection and should not be used as the sole basis for treatment or other patient management decisions. A negative result may occur with  improper specimen collection/handling, submission of specimen other than nasopharyngeal swab, presence of viral mutation(s) within the areas targeted by this assay, and inadequate number of viral copies(<138 copies/mL). A negative result must be combined with clinical observations, patient history, and epidemiological information. The expected result is Negative.  Fact Sheet for Patients:  EntrepreneurPulse.com.au  Fact Sheet for Healthcare Providers:  IncredibleEmployment.be  This test is no t yet approved or cleared by the [place] FDA and  has been authorized for detection and/or diagnosis of SARS-CoV-2 by FDA under an Emergency Use Authorization (EUA). This EUA will remain  in effect (meaning this test can be used) for the duration of the COVID-19 declaration under Section 564(b)(1) of the Act, 21 U.S.C.section 360bbb-3(b)(1), unless the authorization is terminated  or revoked sooner.       Influenza A by PCR NEGATIVE NEGATIVE Final   Influenza B by PCR NEGATIVE NEGATIVE Final    Comment: (NOTE) The Xpert Xpress SARS-CoV-2/FLU/RSV plus assay is intended as an aid in the diagnosis of influenza from Nasopharyngeal swab specimens and should not be used as a sole  basis for treatment. Nasal washings and aspirates are unacceptable for Xpert Xpress SARS-CoV-2/FLU/RSV testing.  Fact Sheet for Patients: EntrepreneurPulse.com.au  Fact Sheet for Healthcare Providers: IncredibleEmployment.be  This test is not yet approved or cleared by the [place] FDA and has been authorized for detection and/or diagnosis of SARS-CoV-2 by FDA under an Emergency Use Authorization (EUA). This EUA will remain in effect (meaning this test can be used) for the duration of the COVID-19 declaration under Section 564(b)(1) of the Act, 21 U.S.C. section 360bbb-3(b)(1), unless the authorization is terminated or revoked.  Performed at [facility], [address] [place], [place] [address]     Radiology Studies: CT Head Wo Contrast  Result Date: 01/06/2021 CLINICAL DATA:  Increasing falls, initial encounter EXAM: CT HEAD WITHOUT CONTRAST TECHNIQUE: Contiguous axial images were obtained from the base of the skull through the vertex without intravenous contrast. COMPARISON:  10/04/2020 FINDINGS: Brain: No evidence of acute infarction, hemorrhage, hydrocephalus, extra-axial collection or mass lesion/mass effect. Mild atrophic and ischemic changes are noted. Vascular: No hyperdense vessel or unexpected calcification. Skull: Normal. Negative for fracture or focal lesion. Sinuses/Orbits: No acute finding. Other: None. IMPRESSION: Mild atrophic and ischemic changes stable from the prior exam. No acute abnormality noted. Electronically Signed   By: Inez Catalina M.D.   On: 01/06/2021 16:04   DG Chest Portable 1 View  Result Date: 01/06/2021 CLINICAL DATA:  COVID positive with cough EXAM: PORTABLE CHEST 1 VIEW COMPARISON:  12/31/2020 FINDINGS: Post sternotomy changes. No focal opacity or pleural effusion. Stable cardiomegaly. No pneumothorax IMPRESSION: No active disease. Electronically Signed   By: Donavan Foil M.D.   On: 01/06/2021  15:28      Malaiya Paczkowski T[address] [place]  If 7PM-7AM, please contact night-coverage [email or web address] 01/07/2021, 12:17 PM

## 2021-01-07 NOTE — Progress Notes (Addendum)
Renal US finding discussed with urology, Dr. Tresa Moore.  CT renal stone study ordered to exclude distal obstructing stone.  Holding Eliquis.  Subcu heparin and SCD for DVT prophylaxis.

## 2021-01-07 NOTE — Plan of Care (Signed)
  Problem: Education: Goal: Knowledge of General Education information will improve Description: Including pain rating scale, medication(s)/side effects and non-pharmacologic comfort measures Outcome: Progressing   Problem: Activity: Goal: Risk for activity intolerance will decrease Outcome: Progressing   Problem: Nutrition: Goal: Adequate nutrition will be maintained Outcome: Progressing   

## 2021-01-08 ENCOUNTER — Inpatient Hospital Stay (HOSPITAL_COMMUNITY): Payer: No Typology Code available for payment source

## 2021-01-08 ENCOUNTER — Encounter (HOSPITAL_COMMUNITY)
Admission: EM | Disposition: A | Discharge status: Home / Self Care | Payer: Self-pay | Source: Home / Self Care | Attending: Family Medicine

## 2021-01-08 ENCOUNTER — Encounter (HOSPITAL_COMMUNITY): Payer: Self-pay | Admitting: Internal Medicine

## 2021-01-08 ENCOUNTER — Inpatient Hospital Stay (HOSPITAL_COMMUNITY): Payer: No Typology Code available for payment source | Admitting: Certified Registered"

## 2021-01-08 DIAGNOSIS — N132 Hydronephrosis with renal and ureteral calculous obstruction: Secondary | ICD-10-CM | POA: Diagnosis not present

## 2021-01-08 DIAGNOSIS — I5030 Unspecified diastolic (congestive) heart failure: Secondary | ICD-10-CM | POA: Diagnosis not present

## 2021-01-08 DIAGNOSIS — E559 Vitamin D deficiency, unspecified: Secondary | ICD-10-CM | POA: Diagnosis not present

## 2021-01-08 DIAGNOSIS — N179 Acute kidney failure, unspecified: Secondary | ICD-10-CM | POA: Diagnosis not present

## 2021-01-08 DIAGNOSIS — I130 Hypertensive heart and chronic kidney disease with heart failure and stage 1 through stage 4 chronic kidney disease, or unspecified chronic kidney disease: Secondary | ICD-10-CM | POA: Diagnosis not present

## 2021-01-08 DIAGNOSIS — N189 Chronic kidney disease, unspecified: Secondary | ICD-10-CM | POA: Diagnosis not present

## 2021-01-08 HISTORY — PX: CYSTOSCOPY/URETEROSCOPY/HOLMIUM LASER/STENT PLACEMENT: SHX6546

## 2021-01-08 LAB — IRON AND TIBC
Iron: 56 ug/dL (ref 45–182)
Saturation Ratios: 34 % (ref 17.9–39.5)
TIBC: 165 ug/dL — ABNORMAL LOW (ref 250–450)
UIBC: 109 ug/dL

## 2021-01-08 LAB — GLUCOSE, CAPILLARY
Glucose-Capillary: 122 mg/dL — ABNORMAL HIGH (ref 70–99)
Glucose-Capillary: 102 mg/dL — ABNORMAL HIGH (ref 70–99)
Glucose-Capillary: 103 mg/dL — ABNORMAL HIGH (ref 70–99)
Glucose-Capillary: 110 mg/dL — ABNORMAL HIGH (ref 70–99)
Glucose-Capillary: 245 mg/dL — ABNORMAL HIGH (ref 70–99)

## 2021-01-08 LAB — RENAL FUNCTION PANEL
Albumin: 3.1 g/dL — ABNORMAL LOW (ref 3.5–5.0)
Anion gap: 8 (ref 5–15)
BUN: 56 mg/dL — ABNORMAL HIGH (ref 8–23)
CO2: 24 mmol/L (ref 22–32)
Calcium: 9.2 mg/dL (ref 8.9–10.3)
Chloride: 100 mmol/L (ref 98–111)
Creatinine, Ser: 5.93 mg/dL — ABNORMAL HIGH (ref 0.61–1.24)
GFR, Estimated: 9 mL/min — ABNORMAL LOW (ref >60)
Glucose, Bld: 113 mg/dL — ABNORMAL HIGH (ref 70–99)
Phosphorus: 4.7 mg/dL — ABNORMAL HIGH (ref 2.5–4.6)
Potassium: 4.4 mmol/L (ref 3.5–5.1)
Sodium: 132 mmol/L — ABNORMAL LOW (ref 135–145)

## 2021-01-08 LAB — RETICULOCYTES
Immature Retic Fract: 11.2 % (ref 2.3–15.9)
RBC.: 3.41 MIL/uL — ABNORMAL LOW (ref 4.22–5.81)
Retic Count, Absolute: 47.1 10*3/uL (ref 19.0–186.0)
Retic Ct Pct: 1.4 % (ref 0.4–3.1)

## 2021-01-08 LAB — CBC
HCT: 33.5 % — ABNORMAL LOW (ref 39.0–52.0)
Hemoglobin: 11.4 g/dL — ABNORMAL LOW (ref 13.0–17.0)
MCH: 33.5 pg (ref 26.0–34.0)
MCHC: 34 g/dL (ref 30.0–36.0)
MCV: 98.5 fL (ref 80.0–100.0)
Platelets: 216 10*3/uL (ref 150–400)
RBC: 3.4 MIL/uL — ABNORMAL LOW (ref 4.22–5.81)
RDW: 14.6 % (ref 11.5–15.5)
WBC: 7.5 10*3/uL (ref 4.0–10.5)
nRBC: 0 % (ref 0.0–0.2)

## 2021-01-08 LAB — MAGNESIUM: Magnesium: 2.4 mg/dL (ref 1.7–2.4)

## 2021-01-08 LAB — FERRITIN: Ferritin: 432 ng/mL — ABNORMAL HIGH (ref 24–336)

## 2021-01-08 LAB — FOLATE: Folate: 7.4 ng/mL (ref >5.9)

## 2021-01-08 LAB — VITAMIN B12: Vitamin B-12: 590 pg/mL (ref 180–914)

## 2021-01-08 LAB — SURGICAL PCR SCREEN
MRSA, PCR: NEGATIVE
Staphylococcus aureus: NEGATIVE

## 2021-01-08 LAB — TSH: TSH: 0.707 u[IU]/mL (ref 0.350–4.500)

## 2021-01-08 SURGERY — CYSTOSCOPY/URETEROSCOPY/HOLMIUM LASER/STENT PLACEMENT
Anesthesia: General | Laterality: Right

## 2021-01-08 MED ORDER — LACTATED RINGERS IV SOLN: INTRAVENOUS | Status: AC

## 2021-01-08 MED ORDER — OXYBUTYNIN CHLORIDE 5 MG PO TABS
5.0000 mg | ORAL_TABLET | Freq: Three times a day (TID) | ORAL | Status: DC | PRN
  Administered 2021-01-09: 5 mg via ORAL
  Filled 2021-01-08: qty 1

## 2021-01-08 MED ORDER — OXYCODONE HCL 5 MG/5ML PO SOLN
5.0000 mg | Freq: Once | ORAL | Status: DC | PRN
Start: 2021-01-08 — End: 2021-01-08

## 2021-01-08 MED ORDER — LIDOCAINE 2% (20 MG/ML) 5 ML SYRINGE
INTRAMUSCULAR | Status: DC | PRN
  Administered 2021-01-08: 60 mg via INTRAVENOUS

## 2021-01-08 MED ORDER — PROPOFOL 10 MG/ML IV BOLUS
INTRAVENOUS | Status: AC
  Filled 2021-01-08: qty 20

## 2021-01-08 MED ORDER — IOHEXOL 300 MG/ML  SOLN
INTRAMUSCULAR | Status: DC | PRN
  Administered 2021-01-08: 9 mL via URETHRAL

## 2021-01-08 MED ORDER — CEFAZOLIN (ANCEF) 1 G IV SOLR: 1.0000 g | INTRAVENOUS | Status: AC

## 2021-01-08 MED ORDER — CEFAZOLIN SODIUM-DEXTROSE 2-3 GM-%(50ML) IV SOLR
INTRAVENOUS | Status: DC | PRN
  Administered 2021-01-08: 2 g via INTRAVENOUS

## 2021-01-08 MED ORDER — FENTANYL CITRATE (PF) 100 MCG/2ML IJ SOLN
INTRAMUSCULAR | Status: DC | PRN
  Administered 2021-01-08 (×4): 25 ug via INTRAVENOUS

## 2021-01-08 MED ORDER — ONDANSETRON HCL 4 MG/2ML IJ SOLN
INTRAMUSCULAR | Status: DC | PRN
  Administered 2021-01-08: 4 mg via INTRAVENOUS

## 2021-01-08 MED ORDER — SODIUM CHLORIDE 0.9 % IV SOLN: INTRAVENOUS | Status: DC | PRN

## 2021-01-08 MED ORDER — FENTANYL CITRATE (PF) 100 MCG/2ML IJ SOLN
INTRAMUSCULAR | Status: AC
  Filled 2021-01-08: qty 2

## 2021-01-08 MED ORDER — DEXAMETHASONE SODIUM PHOSPHATE 10 MG/ML IJ SOLN
INTRAMUSCULAR | Status: DC | PRN
  Administered 2021-01-08: 5 mg via INTRAVENOUS

## 2021-01-08 MED ORDER — OXYCODONE HCL 5 MG PO TABS: 5.0000 mg | ORAL_TABLET | Freq: Once | ORAL | Status: DC | PRN

## 2021-01-08 MED ORDER — CEFAZOLIN SODIUM-DEXTROSE 2-4 GM/100ML-% IV SOLN
INTRAVENOUS | Status: AC
  Filled 2021-01-08: qty 100

## 2021-01-08 MED ORDER — PHENYLEPHRINE HCL-NACL 10-0.9 MG/250ML-% IV SOLN
INTRAVENOUS | Status: DC | PRN
  Administered 2021-01-08: 25 ug/min via INTRAVENOUS

## 2021-01-08 MED ORDER — SODIUM CHLORIDE 0.9 % IR SOLN
Status: DC | PRN
  Administered 2021-01-08: 2000 mL via INTRAVESICAL

## 2021-01-08 MED ORDER — PROPOFOL 10 MG/ML IV BOLUS
INTRAVENOUS | Status: DC | PRN
  Administered 2021-01-08: 120 mg via INTRAVENOUS

## 2021-01-08 MED ORDER — ONDANSETRON HCL 4 MG/2ML IJ SOLN: 4.0000 mg | Freq: Four times a day (QID) | INTRAMUSCULAR | Status: DC | PRN

## 2021-01-08 MED ORDER — FENTANYL CITRATE (PF) 100 MCG/2ML IJ SOLN: 25.0000 ug | INTRAMUSCULAR | Status: DC | PRN

## 2021-01-08 SURGICAL SUPPLY — 22 items
BAG URO CATCHER STRL LF (MISCELLANEOUS) ×2 IMPLANT
CATH INTERMIT  6FR 70CM (CATHETERS) ×1 IMPLANT
CLOTH BEACON ORANGE TIMEOUT ST (SAFETY) ×2 IMPLANT
EXTRACTOR STONE NITINOL NGAGE (UROLOGICAL SUPPLIES) IMPLANT
GLOVE BIOGEL M 8.0 STRL (GLOVE) ×3 IMPLANT
GOWN STRL REUS W/TWL XL LVL3 (GOWN DISPOSABLE) ×3 IMPLANT
GUIDEWIRE ANG ZIPWIRE 038X150 (WIRE) IMPLANT
GUIDEWIRE STR DUAL SENSOR (WIRE) ×3 IMPLANT
IV NS 1000ML (IV SOLUTION) ×2
IV NS 1000ML BAXH (IV SOLUTION) ×1 IMPLANT
KIT TURNOVER KIT A (KITS) ×2 IMPLANT
LASER FIB FLEXIVA PULSE ID 365 (Laser) ×1 IMPLANT
MANIFOLD NEPTUNE II (INSTRUMENTS) ×2 IMPLANT
PACK CYSTO (CUSTOM PROCEDURE TRAY) ×2 IMPLANT
SHEATH URETERAL 12FRX28CM (UROLOGICAL SUPPLIES) IMPLANT
SHEATH URETERAL 12FRX35CM (MISCELLANEOUS) ×1 IMPLANT
SHEATH URETERAL 12FRX55CM (UROLOGICAL SUPPLIES) IMPLANT
STENT URET 6FRX28 CONTOUR (STENTS) ×1 IMPLANT
TRACTIP FLEXIVA PULS ID 200XHI (Laser) IMPLANT
TRACTIP FLEXIVA PULSE ID 200 (Laser)
TUBING CONNECTING 10 (TUBING) ×2 IMPLANT
TUBING UROLOGY SET (TUBING) ×1 IMPLANT

## 2021-01-08 NOTE — Consult Note (Signed)
Urology Consult  Consulting MD: Clarita Leber, MD  CC: Kidney stone  HPI: This is a 75year old male with baseline chronic kidney disease, now with a right mid ureteral calculus and acute on chronic kidney disease.  The patient has been feeling bad for several weeks, but also tested positive for COVID earlier this month.  He has had intermittent right flank pain for several months.  As he did have a bump in his creatinine during this hospitalization, ultrasound was performed revealing right hydronephrosis, CT revealed a moderate sized right proximal/mid ureteral calculus.  Urologic consultation is requested.  PMH: Past Medical History:  Diagnosis Date  . Allergic rhinitis   . Anxiety   . Atrial fibrillation ([place])   . Cancer ([place])    hx of skin cancers   . Chronic kidney disease   . Coronary artery disease    a. s/p MI & CABG; b. s/p PCI Diag;  c. Cath 12/2011: LAD diff dzs/small, Diag 75% isr, 90 dist to stent (small), LCX & RCA occluded, VG->OM 40, VG->PDA patent - med rx.  . Depression    PTSD  . Diabetes mellitus    not on medications  . Diastolic CHF, chronic (HCC)    a. EF 55-60% by echo 2007  . GERD (gastroesophageal reflux disease)    "not anymore" " I had a lap band"  . History of diverticulitis of colon    5 YRS AGO  . History of pneumonia    2017  . History of transient ischemic attack (TIA)    CAROTID DOPPLERS NOV 2011  0-39& BIL. STENOSIS  . Hyperlipidemia   . Hypertension   . Kidney failure   . Mitral regurgitation   . Myocardial infarction ([place])   . Neuromuscular disorder ([place])    left arm numbness   . 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 PTSD (post-traumatic stress disorder)    from [place] since 1973  . 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/25/2016   Complex valvuloplasty including artificial Gore-tex neochord placement x4, plication of anterior commissure, and 26 mm Sorin Memo 3D ring annuloplasty via  right mini thoracotomy approach  . Shortness of breath dyspnea    with exertion  . Skin cancer   . Sleep apnea    cpap- see ov note in [facility] 05/12/11 for settings   . Stroke ([place])    hx of    PSH: Past Surgical History:  Procedure Laterality Date  . AV FISTULA PLACEMENT Left 08/02/2018   Procedure: ARTERIOVENOUS (AV) FISTULA CREATION RADIOCEPHALIC;  Surgeon: Angelia Mould, MD;  Location: [place];  Service: Vascular;  Laterality: Left;  . BLEPHAROPLASTY  1985   BILATERAL  . CARDIAC CATHETERIZATION  2001, 2004, 2009, 2011  . CARDIAC CATHETERIZATION N/A 01/23/2016   Procedure: Right/Left Heart Cath and Coronary Angiography;  Surgeon: [name], MD;  Location: [place] CV LAB;  Service: Cardiovascular;  Laterality: N/A;  . CARDIOVERSION N/A 09/07/2017   Procedure: CARDIOVERSION;  Surgeon: [name], MD;  Location: [facility] ENDOSCOPY;  Service: Cardiovascular;  Laterality: N/A;  . CARDIOVERSION N/A 09/13/2019   Procedure: CARDIOVERSION;  Surgeon: [name], MD;  Location: [facility] ENDOSCOPY;  Service: Cardiovascular;  Laterality: N/A;  . CARDIOVERSION N/A 10/19/2019   Procedure: CARDIOVERSION;  Surgeon: [name], MD;  Location: [facility] ENDOSCOPY;  Service: Cardiovascular;  Laterality: N/A;  . CATARACT EXTRACTION W/ INTRAOCULAR LENS IMPLANT Bilateral   . CHONDROPLASTY  07/22/2011   Procedure: CHONDROPLASTY;  Surgeon: [name];  Location: [place];  Service: Orthopedics;;  . CIRCUMCISION  35 YRS  AGO  . COLONOSCOPY    . CORONARY ANGIOPLASTY  1996-- POST CABG   W/ STENT, last cath 01/07/2012   . CORONARY ARTERY BYPASS GRAFT  1995   X3 VESSEL  . CORONARY ARTERY BYPASS GRAFT  1995  . CORONARY STENT PLACEMENT    . KNEE ARTHROSCOPY  07/22/2011   Procedure: ARTHROSCOPY KNEE;  Surgeon: [name];  Location: [place];  Service: Orthopedics;  Laterality: Right;  WITH DEBRIDEMENT   . KNEE ARTHROSCOPY W/ MENISCECTOMY  X2 IN 2002--  RIGHT KNEE  . LAPAROSCOPIC GASTRIC BANDING  01-15-09  . LEFT HEART CATH AND CORONARY ANGIOGRAPHY N/A 04/29/2017   Procedure: LEFT HEART CATH AND CORONARY ANGIOGRAPHY;  Surgeon: [name], MD;  Location: [place] CV LAB;  Service: Cardiovascular;  Laterality: N/A;  . LEFT HEART CATHETERIZATION WITH CORONARY ANGIOGRAM N/A 09/11/2013   Procedure: LEFT HEART CATHETERIZATION WITH CORONARY ANGIOGRAM;  Surgeon: [name], MD;  Location: [facility] CATH LAB;  Service: Cardiovascular;  Laterality: N/A;  . MITRAL VALVE REPAIR Right 03/25/2016   Procedure: MINIMALLY INVASIVE REOPERATION FOR MITRAL VALVE REPAIR  (MVR) with size 26 Sorin Memo 3D;  Surgeon: [name], MD;  Location: [place];  Service: Open Heart Surgery;  Laterality: Right;  . MITRAL VALVE REPAIR  03/2016  . RIGHT KNEE ED COMPARTMENT REPLACEMENT  2004  . SYNOVECTOMY  07/22/2011   Procedure: SYNOVECTOMY;  Surgeon: [name];  Location: [place];  Service: Orthopedics;;  . TEE WITHOUT CARDIOVERSION N/A 01/23/2016   Procedure: TRANSESOPHAGEAL ECHOCARDIOGRAM (TEE);  Surgeon: [name], MD;  Location: [place];  Service: Cardiovascular;  Laterality: N/A;  . TEE WITHOUT CARDIOVERSION N/A 03/25/2016   Procedure: TRANSESOPHAGEAL ECHOCARDIOGRAM (TEE);  Surgeon: [name], MD;  Location: [place];  Service: Open Heart Surgery;  Laterality: N/A;  . UMBILICAL HERNIA REPAIR  01-15-09   W/ GASTRIC BANDING PROCEDURE    Allergies: Allergies  Allergen Reactions  . Crestor [Rosuvastatin] Other (See Comments)    Muscle aches   . Lipitor [Atorvastatin] Other (See Comments)    Muscle aches  . Pravastatin Other (See Comments)    Muscle aches  . Carvedilol     Other reaction(s): loss of appetite  . Metformin Diarrhea  . Serotonin Reuptake Inhibitors (Ssris)     Other reaction(s): Muscle aches  . Zocor [Simvastatin]     Other reaction(s): Muscle pain  . Codeine Itching and Other (See Comments)    Extremity  tingling-- can take synthetic    Medications: Medications Prior to Admission  Medication Sig Dispense Refill Last Dose  . Alirocumab (PRALUENT) 75 MG/ML SOAJ Inject 75 mg into the skin every 14 (fourteen) days.   12/27/2020  . allopurinol (ZYLOPRIM) 100 MG tablet Take 100 mg by mouth in the morning and at bedtime.    01/06/2021 at Unknown time  . amiodarone (PACERONE) 200 MG tablet Take 1 tablet (200 mg total) by mouth daily. (Patient taking differently: Take 200 mg by mouth 2 (two) times daily.) 60 tablet 0 01/06/2021 at Unknown time  . amoxicillin-clavulanate (AUGMENTIN) 875-125 MG tablet Take 1 tablet by mouth 2 (two) times daily. Start date ; 01/02/11   01/04/2021  . apixaban (ELIQUIS) 5 MG TABS tablet Take 1 tablet (5 mg total) by mouth 2 (two) times daily. 180 tablet 3 01/06/2021  at 11 am  . Artificial Saliva (MOUTH KOTE MT) Use as directed 4 sprays in the mouth or throat every 3 (three) hours as needed (for dry mouth).   01/03/2021  . Ascorbic Acid (VITAMIN C WITH ROSE HIPS) 1000 MG tablet Take 1,000 mg by mouth daily.   12/23/2020  . calcitRIOL (ROCALTROL) 0.25 MCG capsule Take 0.25 mcg by mouth every other day.   01/05/2021 at Unknown time  . Carboxymethylcellulose Sodium 1 % GEL Place 1 drop into both eyes daily as needed (dry eyes).   01/04/2021  . cetirizine (ZYRTEC) 10 MG tablet Take 10 mg by mouth daily.   01/06/2021 at Unknown time  . colchicine 0.6 MG tablet Take 0.6-1.2 mg by mouth 2 (two) times daily as needed (gout).    11/15/2020  . fluticasone (FLONASE) 50 MCG/ACT nasal spray Place 1 spray into both nostrils daily as needed for allergies or rhinitis.   01/05/2021 at Unknown time  . furosemide (LASIX) 40 MG tablet Take 40 mg by mouth daily.   01/06/2021 at Unknown time  . HYDROcodone-acetaminophen (NORCO/VICODIN) 5-325 MG tablet Take 1 tablet by mouth daily as needed for severe pain.   01/05/2021 at Unknown time  . nitroGLYCERIN (NITROSTAT) 0.4 MG SL tablet 1 TAB UNDER THE TONGUE EVERY 5  MINUTES AS NEEDED FOR CHEST PAIN UP TO 3 DOSES, IF PERSIST CALL 911 (Patient taking differently: Place 0.4 mg under the tongue every 5 (five) minutes as needed for chest pain.) 75 tablet 1 unk last dose  . pantoprazole (PROTONIX) 40 MG tablet Take 40 mg by mouth daily as needed (heartburn).   01/01/2021  . potassium chloride SA (K-DUR,KLOR-CON) 20 MEQ tablet Take 20 mEq by mouth daily.   01/06/2021 at Unknown time  . Semaglutide,0.25 or 0.5MG /DOS, (OZEMPIC, 0.25 OR 0.5 MG/DOSE,) 2 MG/1.5ML SOPN Inject 0.375 mLs (0.5 mg total) into the skin every Thursday. 3 pen 3 01/02/2021  . sildenafil (VIAGRA) 100 MG tablet Take 1 tablet by mouth daily as needed for erectile dysfunction.   unk last dose  . tadalafil (CIALIS) 20 MG tablet TAKE 1 TABLET BY MOUTH ONCE DAILY AS NEEDED (Patient taking differently: Take 20 mg by mouth daily as needed for erectile dysfunction.) 10 tablet 0 11/14/2020  . testosterone cypionate (DEPOTESTOSTERONE CYPIONATE) 200 MG/ML injection Inject 200 mg into the muscle every 14 (fourteen) days.   Past Month at Unknown time  . traZODone (DESYREL) 100 MG tablet Take 100 mg by mouth at bedtime.   01/05/2021 at Unknown time  . alprostadil (EDEX) 10 MCG injection 10 mcg by Intracavitary route daily as needed for erectile dysfunction.        Social History: Social History   Socioeconomic History  . Marital status: Married    Spouse name: [name]  . Number of children: 1  . Years of education: Not on file  . Highest education level: Not on file  Occupational History    Comment: Chief Financial Officer retired  Tobacco Use  . Smoking status: Never Smoker  . Smokeless tobacco: Never Used  Vaping Use  . Vaping Use: Never used  Substance and Sexual Activity  . Alcohol use: Not Currently    Alcohol/week: 2.0 standard drinks    Types: 1 Glasses of wine, 1 Shots of liquor per week    Comment: OCCASIONAL  . Drug use: Never  . Sexual activity: Not on file  Other Topics Concern  . Not on file   Social History Narrative   Lives with wife  occas soda   Social Determinants of Health   Financial Resource Strain: Not on file  Food Insecurity: Not on file  Transportation Needs: Not on file  Physical Activity: Not on file  Stress: Not on file  Social Connections: Not on file  Intimate Partner Violence: Not on file    Family History: Family History  Problem Relation Age of Onset  . Other Mother        joint problems  . Alzheimer's disease Mother   . Hypertension Father   . Heart disease Father   . CVA Father        age 4  . Depression Father   . Heart disease Sister        CABG  . Stroke Sister   . Heart failure Sister        congestive  . Diabetes Sister   . Heart disease Brother   . Hypertension Brother   . Congestive Heart Failure Brother     Review of Systems: Positive: Right flank pain, urinary frequency, urgency Negative: No gross hematuria or dysuria.  A further 10 point review of systems was negative except what is listed in the HPI.  Physical Exam: @VITALS2 @ General: No acute distress.  Awake. Head:  Normocephalic.  Atraumatic. ENT:  EOMI.  Mucous membranes moist Neck:  Supple.  No lymphadenopathy. CV:  Regular rate. Pulmonary: Equal effort bilaterally.   Skin:  Normal turgor.  No visible rash. Extremity: No gross deformity of extremities.  Neurologic: Alert. Appropriate mood.   Studies:  Recent Labs    01/07/21 0433 01/08/21 0425  HGB 11.6* 11.4*  WBC 8.0 7.5  PLT 223 216    Recent Labs    01/07/21 0433 01/08/21 0425  NA 133* 132*  K 4.4 4.4  CL 99 100  CO2 25 24  BUN 62* 56*  CREATININE 6.30* 5.93*  CALCIUM 9.3 9.2  GFRNONAA 9* 9*     No results for input(s): INR, APTT in the last 72 hours.  Invalid input(s): PT   Invalid input(s): ABG  I independently reviewed the patient's CT scan images as well as his laboratories.  Assessment: Right mid ureteral stone with significant hydronephrosis, acute on chronic renal  insufficiency  Plan: I have discussed management of this stone with the patient and his wife, who is a retired Scientist, research (life sciences).  I recommended at the very least stent placement, but if not a significant anesthetic risk we will proceed with cystoscopy, right retrograde, right ureteroscopy with laser and extraction of stone as well as stent placement which will be needed for at least a couple of weeks after management of this long-term stone.  Risks and complications have been discussed, and are understood.  We will proceed this evening.    Pager:[phone]

## 2021-01-08 NOTE — Anesthesia Procedure Notes (Signed)
Date/Time: 01/08/2021 5:58 PM Performed by: Cynda Familia, CRNA Oxygen Delivery Method: Simple face mask Placement Confirmation: positive ETCO2 and breath sounds checked- equal and bilateral Dental Injury: Teeth and Oropharynx as per pre-operative assessment

## 2021-01-08 NOTE — Anesthesia Preprocedure Evaluation (Signed)
Anesthesia Evaluation  Patient identified by MRN, date of birth, ID band Patient awake    Reviewed: Allergy & Precautions, H&P , NPO status , Patient's Chart, lab work & pertinent test results  Airway Mallampati: II   Neck ROM: full    Dental   Pulmonary shortness of breath, sleep apnea ,    breath sounds clear to auscultation       Cardiovascular hypertension, + CAD, + Past MI, + CABG and +CHF  + Valvular Problems/Murmurs  Rhythm:regular Rate:Normal  S/p mitral valve repair   Neuro/Psych PSYCHIATRIC DISORDERS Anxiety Depression  Neuromuscular disease CVA    GI/Hepatic GERD  ,  Endo/Other  diabetes, Type 2  Renal/GU Renal InsufficiencyRenal disease     Musculoskeletal  (+) Arthritis ,   Abdominal   Peds  Hematology   Anesthesia Other Findings   Reproductive/Obstetrics                             Anesthesia Physical Anesthesia Plan  ASA: III  Anesthesia Plan: General   Post-op Pain Management:    Induction: Intravenous  PONV Risk Score and Plan: 2 and Ondansetron, Dexamethasone and Treatment may vary due to age or medical condition  Airway Management Planned: LMA  Additional Equipment:   Intra-op Plan:   Post-operative Plan: Extubation in OR  Informed Consent: I have reviewed the patients History and Physical, chart, labs and discussed the procedure including the risks, benefits and alternatives for the proposed anesthesia with the patient or authorized representative who has indicated his/her understanding and acceptance.     Dental advisory given  Plan Discussed with: CRNA, Anesthesiologist and Surgeon  Anesthesia Plan Comments:         Anesthesia Quick Evaluation

## 2021-01-08 NOTE — Op Note (Signed)
Preoperative diagnosis: Obstructing right mid ureteral stone with hydronephrosis, acute on chronic renal failure  Postoperative diagnosis: Same  Principal procedure: Cystoscopy, right retrograde ureteropyelogram, fluoroscopic interpretation, right ureteroscopy, holmium laser/dusting of right ureteral stone, placement of 6 French by 28 cm contour double-J stent without tether  Surgeon: Chikita Dogan  Anesthesia: General with LMA  Complications: None  Specimen: None  Estimated blood loss: Less than 20 mL  Indications: 75 year old male with multiple medical problems including chronic renal insufficiency.  The patient was admitted 2 days ago with feeling weak and fatigued.  He was found to have worsening renal insufficiency.  Baseline creatinine is approximately 3-1/2, his creatinine was over 6.  Ultrasound and CT scan were performed, revealing hydronephrosis and then a moderate sized right mid ureteral stone with significant hydronephrosis.  He has been symptomatic with flank pain.  Because of his renal insufficiency and stone, it was recommended that he undergo urgent management including cystoscopy, ureteroscopy and holmium laser of the stone with stent placement.  I discussed the procedure with the patient, and his wife.  She is a retired Scientist, research (life sciences) here at [employer].  They understand the procedure, risks, complications, and desire to proceed.  Findings: Prostate was moderately obstructing with trilobar hypertrophy.  Bladder revealed trabeculations.  There were no urothelial lesions.  There was a little bit of blood at the right ureteral orifice.  Ureteral orifice ease were normal in configuration location.  Upon retrograde study of the right ureter, the distal ureter was normal but the mid and proximal ureter were hydronephrotic and tortuous.  Pyelocalyceal system was dilated.  There was a filling defect at the beginning of the tortuosity consistent with the previously mentioned  stone.  Description of procedure: The patient was properly identified and marked in the holding area.  He received 2 g of Ancef preoperatively.  He was taken to the operating room where general anesthetic was administered with the LMA.  He was placed in the dorsolithotomy position.  Genitalia and perineum were prepped, draped, proper timeout was performed.  21 French panendoscope was advanced into the bladder with the above-mentioned findings noted.  Retrograde study using a 6 [place] open-ended catheter and Omnipaque was performed.  Above-mentioned findings were noted.  I negotiated a sensor tip guidewire with some difficulty up to the proximal ureter and upper pole calyceal system.  It was somewhat difficult due to tortuosity.  I then dilated the mid and distal ureter first with the inner core and then the entire 12/14 ureteral access catheter.  A second guidewire was also placed.  The 6 French short dual-lumen semirigid ureteroscope was advanced easily through the urethra, the bladder and up to the point of the stone in the mid ureter.  It was encountered and the 365 m fiber was utilized to treat the stone.  The Moses laser was put on dusting setting.  The stone was then fragmented into multiple tiny fragments, all of which I felt were easily passable.  There being no larger stone fragments seen, I negotiated the scope up to the renal pelvis.  No other calculi were seen.  At this point, the ureteroscope was removed.  The guidewire was backloaded through the cystoscope and a 28 cm x 6 [place] contour double-J stent was then passed, and once the guidewire was removed, excellent proximal and distal curls were seen using fluoroscopic and cystoscopic guidance, respectively.  The bladder was drained, the scope removed, and the procedure terminated.  The patient was awakened and taken  to the PACU in stable condition, having tolerated the procedure well.

## 2021-01-08 NOTE — Anesthesia Postprocedure Evaluation (Signed)
Anesthesia Post Note  Patient: SOHRAB KEELAN  Procedure(s) Performed: CYSTOSCOPY/RETROGRADE/URETEROSCOPY/HOLMIUM LASER/STENT PLACEMENT (Right )     Patient location during evaluation: PACU Anesthesia Type: General Level of consciousness: awake and alert Pain management: pain level controlled Vital Signs Assessment: post-procedure vital signs reviewed and stable Respiratory status: spontaneous breathing, nonlabored ventilation, respiratory function stable and patient connected to nasal cannula oxygen Cardiovascular status: blood pressure returned to baseline and stable Postop Assessment: no apparent nausea or vomiting Anesthetic complications: no   No complications documented.  Last Vitals:  Vitals:   01/08/21 1845 01/08/21 1858  BP: (!) 142/74 (!) 150/83  Pulse: 61 63  Resp: 14 16  Temp:  36.7 C  SpO2: 99% 100%    Last Pain:  Vitals:   01/08/21 1858  TempSrc: Oral  PainSc:                  [place]

## 2021-01-08 NOTE — Anesthesia Procedure Notes (Signed)
Procedure Name: LMA Insertion Date/Time: 01/08/2021 5:18 PM Performed by: Cynda Familia, CRNA Pre-anesthesia Checklist: Patient identified, Emergency Drugs available, Suction available and Patient being monitored Patient Re-evaluated:Patient Re-evaluated prior to induction Oxygen Delivery Method: Circle System Utilized Preoxygenation: Pre-oxygenation with 100% oxygen Induction Type: IV induction Ventilation: Mask ventilation without difficulty LMA: LMA inserted LMA Size: 4.0 Number of attempts: 1 Airway Equipment and Method: Bite block Placement Confirmation: positive ETCO2 Tube secured with: Tape Dental Injury: Teeth and Oropharynx as per pre-operative assessment  Comments: Smooth IV induction [place]-- LMA insertion AM CRNA atraumatic-- teeth and mouth as preop bilat BS

## 2021-01-08 NOTE — Progress Notes (Signed)
PROGRESS NOTE    [name]  UEK:[id number] DOB: 13-Jan-1946 DOA: 01/06/2021 PCP: [name], MD   Chief Complaint  Patient presents with  . Covid Positive   Brief Narrative:  75 year old M with PMH of CAD with remote CABG, systolic CHF (15-05%), DM-2, [name]. fib on Eliquis, OSA, HTN, HLD, CKD-4 followed by Dr. Johnney [name], depression, recent COVID-19 infection (positive on 5/1) and hypogonadism presented to [facility] ED with progressive weakness, fatigue and decreased oral intake that has acutely gotten worse over for 3 to 4 days prior to admission.  He was found to have acute kidney injury and imaging shows [name] 8-9 mm stone in the mid right ureter causing obstructive change.  Urology has been consulted and is planning stent placement.  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 -CT stone study with 8-9 mm stone in the mid right ureter causing obstructive change -UA with RBC's, large Hb -urology c/s, appreciate recs -> plan for stent placement (vs cystoscopy, right retrograde, right ureteroscopy with laser and extraction of stone as well as stent placement) -Continue IV fluid -Renal ultrasound (right hydro and nephrolithiasis), uric acid (4.3), CK (34) -Needs repeat UA outpatient  -Closely monitor urine output   Hyponatremia: Improving.  Likely hypovolemic. -Continue IV fluid  Chronic systolic CHF: TTE in 01/9793 with LVEF of 45 to 50%, global hypokinesis indeterminate DD severe LAE and moderate MVR.  Patient appears euvolemic on exam.  No cardiopulmonary symptoms. -Continue holding  Lasix -Monitor fluid and respiratory status with additional IVF -Strict I/O, daily weights  CAD with remote CABG: No anginal symptoms. -Not on medication.  Controlled NIDDM-2 with hyperglycemia: A1c 6.0% in 09/2020.   OSA on CPAP -Continue nightly CPAP  Paroxysmal [name]. fib on Eliquis -Continue home amiodarone and Eliquis (currently on hold)  Essential hypertension? Normotensive.  Does not seem to be on medication for this.  Hyperlipidemia -not on statin   Depression: Stable -Continue home medications - trazodone,   Hypogonadism: Seems to be [name] testosterone injection biweekly.  Last injection in October 2021.  Does not explain patient's acute weakness. -Outpatient follow-up  Recent COVID-19 infection: Reportedly tested positive on 5/1 but did not require hospitalization.  COVID-19 PCR negative on admission. -No indication for airborne or contact precaution  Generalized weakness-likely in the setting of poor p.o. intake, AKI and recent COVID-19 infection.  Improving. -PT/OT eval  DVT prophylaxis: heparin  Code Status: full  Family Communication: none at bedside Disposition:   Status is: Inpatient  Remains inpatient appropriate because:Inpatient level of care appropriate due to severity of illness   Dispo: The patient is from: Home              Anticipated d/c is to: Home              Patient currently is not medically stable to d/c.   Difficult to place patient No       Consultants:   urology  Procedures:  none  Antimicrobials:  Anti-infectives (From admission, onward)   Start     Dose/Rate Route Frequency Ordered Stop  01/08/21 1315  ceFAZolin (ANCEF) powder 1 g        1 g Other To Surgery 01/08/21 1223 01/09/21 1315         Subjective: No new complaints Less abdominal pain, feeling better  Objective: Vitals:   01/08/21 0101 01/08/21 0500 01/08/21 0521 01/08/21 1307  BP: 118/69  121/67 114/61  Pulse: 61  (!) 55 (!) 56  Resp: 20  20 18    Temp: 98.1 F (36.7 C)  (!) 97.4 F (36.3 C) 98.3 F (36.8 C)  TempSrc: Oral  Oral Oral  SpO2: 99%  98% 96%  Weight:  81.3 kg    Height:        Intake/Output Summary (Last 24 hours) at 01/08/2021 1412 Last data filed at 01/08/2021 1100 Gross per 24 hour  Intake 978.04 ml  Output 575 ml  Net 403.04 ml   Filed Weights   01/07/21 0442 01/07/21 0506 01/08/21 0500  Weight: 81.1 kg 80.9 kg 81.3 kg    Examination:  General exam: Appears calm and comfortable  Respiratory system: unlabored Cardiovascular system: RRR Gastrointestinal system: Abdomen is nondistended, soft and nontender Central nervous system: Alert and oriented. No focal neurological deficits. Extremities: no LEE Skin: No rashes, lesions or ulcers Psychiatry: Judgement and insight appear normal. Mood & affect appropriate.     Data Reviewed: I have personally reviewed following labs and imaging studies  CBC: Recent Labs  Lab 01/06/21 1456 01/07/21 0433 01/08/21 0425  WBC 8.5 8.0 7.5  NEUTROABS 6.8  --   --   HGB 13.2 11.6* 11.4*  HCT 37.8* 33.8* 33.5*  MCV 97.9 98.0 98.5  PLT 206 223 588    Basic Metabolic Panel: Recent Labs  Lab 01/06/21 1456 01/07/21 0433 01/08/21 0425  NA 131* 133* 132*  K 5.0 4.4 4.4  CL 95* 99 100  CO2 22 25 24   GLUCOSE 126* 110* 113*  BUN 61* 62* 56*  CREATININE 6.09* 6.30* 5.93*  CALCIUM 9.6 9.3 9.2  MG  --   --  2.4  PHOS  --   --  4.7*    GFR: Estimated Creatinine Clearance: 11.1 mL/min ([name]) (by C-G formula based on SCr of 5.93 mg/dL (H)).  Liver Function Tests: Recent Labs  Lab 01/06/21 1456 01/07/21 0433 01/08/21 0425  AST 50* 39  --   ALT 35 46*  --   ALKPHOS 66 60  --   BILITOT 1.6* 1.1  --   PROT 6.6 5.4*  --   ALBUMIN 3.5 3.2* 3.1*    CBG: Recent Labs  Lab 01/07/21 1137 01/07/21 1602 01/07/21 2014 01/08/21 0738 01/08/21 1140  GLUCAP 128* 149* 177* 122* 102*     Recent Results (from the past 240 hour(s))  Resp Panel by RT-PCR (Flu  [name]&B, Covid) Nasopharyngeal Swab     Status: None   Collection Time: 01/06/21  4:35 PM   Specimen: Nasopharyngeal Swab; Nasopharyngeal(NP) swabs in vial transport medium  Result Value Ref Range Status   SARS Coronavirus 2 by RT PCR NEGATIVE NEGATIVE Final    Comment: (NOTE) SARS-CoV-2 target nucleic acids are NOT DETECTED.  The SARS-CoV-2 RNA is generally detectable in upper respiratory specimens during the acute phase of infection. The lowest concentration of SARS-CoV-2 viral copies this assay can detect is 138 copies/mL. Doy Taaffe negative result does not preclude SARS-Cov-2 infection and should not be used as the sole basis for treatment or other patient management decisions. Ravonda Brecheen negative result may occur with  improper specimen collection/handling, submission  of specimen other than nasopharyngeal swab, presence of viral mutation(s) within the areas targeted by this assay, and inadequate number of viral copies(<138 copies/mL). Vadim Centola negative result must be combined with clinical observations, patient history, and epidemiological information. The expected result is Negative.  Fact Sheet for Patients:  EntrepreneurPulse.com.au  Fact Sheet for Healthcare Providers:  IncredibleEmployment.be  This test is no t yet approved or cleared by the [place] FDA and  has been authorized for detection and/or diagnosis of SARS-CoV-2 by FDA under an Emergency Use Authorization (EUA). This EUA will remain  in effect (meaning this test can be used) for the duration of the COVID-19 declaration under Section 564(b)(1) of the Act, 21 U.S.C.section 360bbb-3(b)(1), unless the authorization is terminated  or revoked sooner.       Influenza Ziyon Cedotal by PCR NEGATIVE NEGATIVE Final   Influenza B by PCR NEGATIVE NEGATIVE Final    Comment: (NOTE) The Xpert Xpress SARS-CoV-2/FLU/RSV plus assay is intended as an aid in the diagnosis of influenza from Nasopharyngeal swab specimens  and should not be used as Dustin Bumbaugh sole basis for treatment. Nasal washings and aspirates are unacceptable for Xpert Xpress SARS-CoV-2/FLU/RSV testing.  Fact Sheet for Patients: EntrepreneurPulse.com.au  Fact Sheet for Healthcare Providers: IncredibleEmployment.be  This test is not yet approved or cleared by the [place] FDA and has been authorized for detection and/or diagnosis of SARS-CoV-2 by FDA under an Emergency Use Authorization (EUA). This EUA will remain in effect (meaning this test can be used) for the duration of the COVID-19 declaration under Section 564(b)(1) of the Act, 21 U.S.C. section 360bbb-3(b)(1), unless the authorization is terminated or revoked.  Performed at [facility], [address] [place], [place] [address]          Radiology Studies: CT Head Wo Contrast  Result Date: 01/06/2021 CLINICAL DATA:  Increasing falls, initial encounter EXAM: CT HEAD WITHOUT CONTRAST TECHNIQUE: Contiguous axial images were obtained from the base of the skull through the vertex without intravenous contrast. COMPARISON:  10/04/2020 FINDINGS: Brain: No evidence of acute infarction, hemorrhage, hydrocephalus, extra-axial collection or mass lesion/mass effect. Mild atrophic and ischemic changes are noted. Vascular: No hyperdense vessel or unexpected calcification. Skull: Normal. Negative for fracture or focal lesion. Sinuses/Orbits: No acute finding. Other: None. IMPRESSION: Mild atrophic and ischemic changes stable from the prior exam. No acute abnormality noted. Electronically Signed   By: Inez Catalina M.D.   On: 01/06/2021 16:04   US RENAL  Result Date: 01/07/2021 CLINICAL DATA:  Acute kidney injury EXAM: RENAL / URINARY TRACT ULTRASOUND COMPLETE COMPARISON:  CT 04/11/2020 FINDINGS: Right Kidney: Renal measurements: 12.4 x 5.6 x 6.6 cm = volume: 238 mL. Echogenicity within normal limits. Mild hydronephrosis, new since previous.  Shadowing calculi largest 1.2 cm in the lower pole collecting system. Left Kidney: Renal measurements: 9.7 x 5.5 x 5.2 cm = volume: 145 mL. Echogenicity within normal limits. No mass or hydronephrosis visualized. Bladder: Appears normal for degree of bladder distention. Other: None. IMPRESSION: 1. Right hydronephrosis and nephrolithiasis. 2. Left kidney and bladder unremarkable. Electronically Signed   By: Lucrezia Europe M.D.   On: 01/07/2021 16:13   DG Chest Portable 1 View  Result Date: 01/06/2021 CLINICAL DATA:  COVID positive with cough EXAM: PORTABLE CHEST 1 VIEW COMPARISON:  12/31/2020 FINDINGS: Post sternotomy changes. No focal opacity or pleural effusion. Stable cardiomegaly. No pneumothorax IMPRESSION: No active disease. Electronically Signed   By: Donavan Foil M.D.   On: 01/06/2021 15:28  CT RENAL STONE STUDY  Result Date: 01/07/2021 CLINICAL DATA:  Acute renal failure EXAM: CT ABDOMEN AND PELVIS WITHOUT CONTRAST TECHNIQUE: Multidetector CT imaging of the abdomen and pelvis was performed following the standard protocol without IV contrast. COMPARISON:  Ultrasound from earlier in the same day, CT from 04/11/2020. FINDINGS: Lower chest: Few calcified granulomas are noted in the right lung base. No focal infiltrate or sizable effusion is seen. Hepatobiliary: Gallbladder demonstrates multiple gallstones without complicating factors. The liver appears within normal limits. Pancreas: Unremarkable. No pancreatic ductal dilatation or surrounding inflammatory changes. Spleen: Normal in size without focal abnormality. Adrenals/Urinary Tract: Adrenal glands are within normal limits. Left kidney demonstrates Vikash Nest tiny nonobstructing stone. The ureter is within normal limits. The right kidney demonstrates increased perinephric stranding as well as hydronephrosis. This is secondary to Brandie Lopes mid right ureteral stone best seen on image number 52 of series 2. This measures approximately 8-9 mm in greatest dimension. The  more distal right ureter is within normal limits. Nonobstructing 8 mm stone is noted within the right kidney as well as Yanilen Adamik smaller upper pole stone. Bladder is decompressed. Stomach/Bowel: Scattered diverticular change of the colon is noted without evidence of diverticulitis. The appendix is within normal limits. Small bowel and stomach are unremarkable. Gastric lap band is noted in satisfactory position. Vascular/Lymphatic: Aortic atherosclerosis. No enlarged abdominal or pelvic lymph nodes. Reproductive: Prostate is unremarkable. Other: No abdominal wall hernia or abnormality. No abdominopelvic ascites. Musculoskeletal: Degenerative changes of lumbar spine are noted. IMPRESSION: Nonobstructing bilateral renal calculi. 8-9 mm stone in the mid right ureter causing obstructive change. This corresponds with that seen on recent ultrasound. Stable gastric lap band. Diverticulosis without diverticulitis. Electronically Signed   By: Inez Catalina M.D.   On: 01/07/2021 20:28        Scheduled Meds: . allopurinol  50 mg Oral Once per day on Mon Thu  . amiodarone  200 mg Oral Daily  . calcitRIOL  0.25 mcg Oral QODAY  . ceFAZolin  1 g Other To OR  . heparin  5,000 Units Subcutaneous Q8H  . insulin aspart  0-5 Units Subcutaneous QHS  . insulin aspart  0-9 Units Subcutaneous TID WC  . traZODone  100 mg Oral QHS   Continuous Infusions:   LOS: 2 days    Time spent: over 30 min    Fayrene Helper, MD [facility]   To contact the attending provider between 7A-7P or the covering provider during after hours 7P-7A, please log into the web site [email or web address] and access using universal [place] password for that web site. If you do not have the password, please call the hospital operator.  01/08/2021, 2:12 PM

## 2021-01-08 NOTE — Transfer of Care (Signed)
Immediate Anesthesia Transfer of Care Note  Patient: [name]  Procedure(s) Performed: CYSTOSCOPY/RETROGRADE/URETEROSCOPY/HOLMIUM LASER/STENT PLACEMENT (Right )  Patient Location: PACU  Anesthesia Type:General  Level of Consciousness: awake and alert   Airway & Oxygen Therapy: Patient Spontanous Breathing and Patient connected to face mask oxygen  Post-op Assessment: Report given to RN and Post -op Vital signs reviewed and stable  Post vital signs: Reviewed and stable  Last Vitals:  Vitals Value Taken Time  BP 139/88 01/08/21 1811  Temp    Pulse 58 01/08/21 1812  Resp 15 01/08/21 1812  SpO2 100 % 01/08/21 1812  Vitals shown include unvalidated device data.  Last Pain:  Vitals:   01/08/21 1648  TempSrc: Oral  PainSc: 8       Patients Stated Pain Goal: 5 (51/83/43 7357)  Complications: No complications documented.

## 2021-01-09 ENCOUNTER — Encounter (HOSPITAL_COMMUNITY): Payer: Self-pay | Admitting: Urology

## 2021-01-09 DIAGNOSIS — N179 Acute kidney failure, unspecified: Secondary | ICD-10-CM | POA: Diagnosis not present

## 2021-01-09 LAB — COMPREHENSIVE METABOLIC PANEL
ALT: 120 U/L — ABNORMAL HIGH (ref 0–44)
ALT: 114 U/L — ABNORMAL HIGH (ref 0–44)
AST: 107 U/L — ABNORMAL HIGH (ref 15–41)
AST: 123 U/L — ABNORMAL HIGH (ref 15–41)
Albumin: 3.1 g/dL — ABNORMAL LOW (ref 3.5–5.0)
Albumin: 2.9 g/dL — ABNORMAL LOW (ref 3.5–5.0)
Alkaline Phosphatase: 69 U/L (ref 38–126)
Alkaline Phosphatase: 66 U/L (ref 38–126)
Anion gap: 8 (ref 5–15)
Anion gap: 10 (ref 5–15)
BUN: 60 mg/dL — ABNORMAL HIGH (ref 8–23)
BUN: 61 mg/dL — ABNORMAL HIGH (ref 8–23)
CO2: 22 mmol/L (ref 22–32)
CO2: 23 mmol/L (ref 22–32)
Calcium: 9.2 mg/dL (ref 8.9–10.3)
Calcium: 9.2 mg/dL (ref 8.9–10.3)
Chloride: 100 mmol/L (ref 98–111)
Chloride: 99 mmol/L (ref 98–111)
Creatinine, Ser: 5.57 mg/dL — ABNORMAL HIGH (ref 0.61–1.24)
Creatinine, Ser: 5.34 mg/dL — ABNORMAL HIGH (ref 0.61–1.24)
GFR, Estimated: 10 mL/min — ABNORMAL LOW (ref >60)
GFR, Estimated: 11 mL/min — ABNORMAL LOW (ref >60)
Glucose, Bld: 201 mg/dL — ABNORMAL HIGH (ref 70–99)
Glucose, Bld: 236 mg/dL — ABNORMAL HIGH (ref 70–99)
Potassium: 4.6 mmol/L (ref 3.5–5.1)
Potassium: 4.5 mmol/L (ref 3.5–5.1)
Sodium: 130 mmol/L — ABNORMAL LOW (ref 135–145)
Sodium: 132 mmol/L — ABNORMAL LOW (ref 135–145)
Total Bilirubin: 0.7 mg/dL (ref 0.3–1.2)
Total Bilirubin: 0.7 mg/dL (ref 0.3–1.2)
Total Protein: 5.4 g/dL — ABNORMAL LOW (ref 6.5–8.1)
Total Protein: 5.3 g/dL — ABNORMAL LOW (ref 6.5–8.1)

## 2021-01-09 LAB — CBC WITH DIFFERENTIAL/PLATELET
Abs Immature Granulocytes: 0.03 10*3/uL (ref 0.00–0.07)
Basophils Absolute: 0 10*3/uL (ref 0.0–0.1)
Basophils Relative: 0 %
Eosinophils Absolute: 0 10*3/uL (ref 0.0–0.5)
Eosinophils Relative: 0 %
HCT: 33.4 % — ABNORMAL LOW (ref 39.0–52.0)
Hemoglobin: 11.3 g/dL — ABNORMAL LOW (ref 13.0–17.0)
Immature Granulocytes: 1 %
Lymphocytes Relative: 4 %
Lymphs Abs: 0.2 10*3/uL — ABNORMAL LOW (ref 0.7–4.0)
MCH: 33.6 pg (ref 26.0–34.0)
MCHC: 33.8 g/dL (ref 30.0–36.0)
MCV: 99.4 fL (ref 80.0–100.0)
Monocytes Absolute: 0.1 10*3/uL (ref 0.1–1.0)
Monocytes Relative: 1 %
Neutro Abs: 4.8 10*3/uL (ref 1.7–7.7)
Neutrophils Relative %: 94 %
Platelets: 215 10*3/uL (ref 150–400)
RBC: 3.36 MIL/uL — ABNORMAL LOW (ref 4.22–5.81)
RDW: 14.7 % (ref 11.5–15.5)
WBC: 5.1 10*3/uL (ref 4.0–10.5)
nRBC: 0 % (ref 0.0–0.2)

## 2021-01-09 LAB — HEPATITIS PANEL, ACUTE
HCV Ab: NONREACTIVE
Hep A IgM: NONREACTIVE
Hep B C IgM: NONREACTIVE
Hepatitis B Surface Ag: NONREACTIVE

## 2021-01-09 LAB — GLUCOSE, CAPILLARY
Glucose-Capillary: 159 mg/dL — ABNORMAL HIGH (ref 70–99)
Glucose-Capillary: 217 mg/dL — ABNORMAL HIGH (ref 70–99)
Glucose-Capillary: 197 mg/dL — ABNORMAL HIGH (ref 70–99)
Glucose-Capillary: 152 mg/dL — ABNORMAL HIGH (ref 70–99)

## 2021-01-09 LAB — MAGNESIUM: Magnesium: 2.3 mg/dL (ref 1.7–2.4)

## 2021-01-09 LAB — PHOSPHORUS: Phosphorus: 4.2 mg/dL (ref 2.5–4.6)

## 2021-01-09 NOTE — Care Management Important Message (Signed)
Important Message  Patient Details IM Letter given to the Patient. Name: JAVIAN NUDD MRN: [id number] Date of Birth: 09-05-1945   Medicare Important Message Given:  Yes     Kerin Salen 01/09/2021, 11:13 AM

## 2021-01-09 NOTE — Evaluation (Signed)
Physical Therapy Evaluation Patient Details Name: [name] MRN: [id number] DOB: [birth year] Today's Date: 01/09/2021   History of Present Illness  75 year old M  presented to [facility] with progressive weakness, fatigue and decreased oral intake.  Dx of AKI, R ureter stone, s/p stent placement 01/08/21. Pt with PMH of CAD with remote CABG, systolic CHF (66-06%), DM-2, A. fib on Eliquis, OSA, HTN, HLD, CKD-4 followed by [name], depression, recent COVID-19 infection (positive on 5/1) and hypogonadism.  Clinical Impression  Pt admitted with above diagnosis. Pt ambulated 360' holding IV pole, with mild unsteadiness but no overt loss of balance. Encouraged pt to ambulate in halls TID with supervision. Pt currently with functional limitations due to the deficits listed below (see PT Problem List). Pt will benefit from skilled PT to increase their independence and safety with mobility to allow discharge to the venue listed below.       Follow Up Recommendations No PT follow up    Equipment Recommendations  None recommended by PT    Recommendations for Other Services       Precautions / Restrictions        Mobility  Bed Mobility Overal bed mobility: Needs Assistance Bed Mobility: Rolling;Sidelying to Sit Rolling: Supervision Sidelying to sit: Supervision;HOB elevated       General bed mobility comments: HOB up, VCs for log roll technique    Transfers Overall transfer level: Needs assistance Equipment used: None Transfers: Sit to/from Stand Sit to Stand: Supervision         General transfer comment: supervision for safety  Ambulation/Gait Ambulation/Gait assistance: Supervision Gait Distance (Feet): 360 Feet Assistive device: IV Pole Gait Pattern/deviations: Step-through pattern Gait velocity: WNL   General Gait Details: mild unsteadiness but no overt loss of balance, pt able to self correct balance while holding onto IV pole  Stairs            Wheelchair  Mobility    Modified Rankin (Stroke Patients Only)       Balance Overall balance assessment: Needs assistance Sitting-balance support: Feet supported;No upper extremity supported Sitting balance-Leahy Scale: Good       Standing balance-Leahy Scale: Good                               Pertinent Vitals/Pain Pain Assessment: No/denies pain    Home Living Family/patient expects to be discharged to:: Private residence Living Arrangements: Spouse/significant other;Children;Other relatives Available Help at Discharge: Family;Available 24 hours/day Type of Home: House Home Access: Level entry     Home Layout: Multi-level;Able to live on main level with bedroom/bathroom Home Equipment: Shower seat;Walker - 2 wheels;Cane - single point;Bedside commode      Prior Function Level of Independence: Independent         Comments: likes to swing dance     Hand Dominance        Extremity/Trunk Assessment   Upper Extremity Assessment Upper Extremity Assessment: Overall WFL for tasks assessed    Lower Extremity Assessment Lower Extremity Assessment: Overall WFL for tasks assessed    Cervical / Trunk Assessment Cervical / Trunk Assessment: Normal  Communication   Communication: No difficulties  Cognition Arousal/Alertness: Awake/alert Behavior During Therapy: WFL for tasks assessed/performed Overall Cognitive Status: Within Functional Limits for tasks assessed  General Comments      Exercises     Assessment/Plan    PT Assessment Patient needs continued PT services  PT Problem List Decreased activity tolerance;Decreased balance       PT Treatment Interventions      PT Goals (Current goals can be found in the Care Plan section)  Acute Rehab PT Goals Patient Stated Goal: return to swing and line dancing PT Goal Formulation: With patient/family Time For Goal Achievement: 01/23/21 Potential to  Achieve Goals: Good    Frequency Min 3X/week   Barriers to discharge        Co-evaluation               AM-PAC PT "6 Clicks" Mobility  Outcome Measure Help needed turning from your back to your side while in a flat bed without using bedrails?: None Help needed moving from lying on your back to sitting on the side of a flat bed without using bedrails?: A Little Help needed moving to and from a bed to a chair (including a wheelchair)?: None Help needed standing up from a chair using your arms (e.g., wheelchair or bedside chair)?: None Help needed to walk in hospital room?: None Help needed climbing 3-5 steps with a railing? : None 6 Click Score: 23    End of Session Equipment Utilized During Treatment: Gait belt Activity Tolerance: Patient tolerated treatment well Patient left: in chair;with call bell/phone within reach;with chair alarm set Nurse Communication: Mobility status PT Visit Diagnosis: Difficulty in walking, not elsewhere classified (R26.2)    Time: 1696-7893 PT Time Calculation (min) (ACUTE ONLY): 22 min   Charges:   PT Evaluation $PT Eval Low Complexity: 1 Low        Philomena Doheny PT 01/09/2021  Acute Rehabilitation Services Pager [phone] Office [phone]

## 2021-01-09 NOTE — Progress Notes (Signed)
PROGRESS NOTE    [name]  PYK:[id number] DOB: 08-Feb-1946 DOA: 01/06/2021 PCP: Jason Cruel, MD   Chief Complaint  Patient presents with  . Covid Positive   Brief Narrative:  75 year old M with PMH of CAD with remote CABG, systolic CHF (53-97%), DM-2, [name]. fib on Eliquis, OSA, HTN, HLD, CKD-4 followed by Dr. Johnney [name], depression, recent COVID-19 infection (positive on 5/1) and hypogonadism presented to [name] with progressive weakness, fatigue and decreased oral intake that has acutely gotten worse over for 3 to 4 days prior to admission.  He was found to have acute kidney injury and imaging shows [name] 8-9 mm stone in the mid right ureter causing obstructive change.  Urology has been consulted and is planning stent placement.  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 -CT stone study with 8-9 mm stone in the mid right ureter causing obstructive change -UA with RBC's, large Hb -urology c/s, appreciate recs -s/p cystoscopy, right retrograde ureteropyelogram, fluoroscopic interpretation, right ureteroscopy, holmium laser/dusting of right ureteral stone, placement of 6 french by 28 cm contour double J stent on 5/18  - discussed with Dr. Pierce [name], ok with resuming anticoagulation, will plan for tomorrow -Continue IV fluid - renal function mildly improving -Renal ultrasound (right hydro and nephrolithiasis), uric acid (4.3), CK (34) -Needs repeat UA outpatient  -Closely monitor urine output   Elevated Liver Enzymes Related to meds? Unclear at this time.  Hypovolemia/shock liver?  (no clear hypotension noted). Acute hepatitis panel, follow labs  Bradycardia  Wenckebach Concern for 2nd degree type 2 on telemetry, reviewed with cardiology - noted wenckebach EKG sinus brady Discussed - Ok to continue amiodarone, will continue to monitor  Hyponatremia: Improving.  Likely hypovolemic. -Continue IV fluid  Chronic systolic CHF: TTE in 01/7340 with LVEF of 45 to 50%, global hypokinesis indeterminate DD severe LAE and moderate MVR.  Patient appears euvolemic on exam.  No cardiopulmonary symptoms. -Continue holding Lasix -Monitor fluid and respiratory status with additional IVF -Strict I/O, daily weights  CAD with remote CABG: No anginal symptoms. -Not on medication.  Controlled NIDDM-2 with hyperglycemia: A1c 6.0% in 09/2020.   OSA on CPAP -Continue nightly CPAP  Paroxysmal [name]. fib on Eliquis -Continue home amiodarone and Eliquis (currently on hold)  Essential hypertension? Normotensive.  Does not seem to be on medication for this.  Hyperlipidemia -not on statin   Depression: Stable -Continue home medications - trazodone,   Hypogonadism: Seems to be [name] testosterone injection biweekly.  Last injection in October 2021.  Does not explain patient's acute weakness. -Outpatient follow-up  Recent COVID-19 infection: Reportedly tested positive on 5/1 but did not require hospitalization.  COVID-19 PCR negative on admission. -No indication for airborne or contact precaution  Generalized weakness-likely in the setting of poor p.o. intake, AKI and recent COVID-19 infection.  Improving. -PT/OT eval  DVT prophylaxis: heparin  Code Status: full  Family Communication: wife at bedside Disposition:   Status is: Inpatient  Remains inpatient appropriate because:Inpatient level of care appropriate due to severity of illness   Dispo: The patient is from: Home  Anticipated d/c is to: Home              Patient currently is not medically stable to  d/c.   Difficult to place patient No       Consultants:   urology  Procedures:  none  Antimicrobials:  Anti-infectives (From admission, onward)   Start     Dose/Rate Route Frequency Ordered Stop   01/08/21 1700  ceFAZolin (ANCEF) 2-4 GM/100ML-% IVPB       Note to Pharmacy: [name]   : cabinet override      01/08/21 1700 01/09/21 0514   01/08/21 1315  ceFAZolin (ANCEF) powder 1 g        1 g Other To Surgery 01/08/21 1223 01/09/21 1315         Subjective: Feels more fatigued today  Objective: Vitals:   01/08/21 2150 01/09/21 0306 01/09/21 0540 01/09/21 1507  BP: 112/76 113/61 (!) 153/80 132/66  Pulse: (!) 56 (!) 56 (!) 56 (!) 115  Resp: 17 18    Temp: 98.6 F (37 C) 98.4 F (36.9 C) (!) 97.4 F (36.3 C) 97.7 F (36.5 C)  TempSrc: Oral Oral  Oral  SpO2: 95% 98% 98% 99%  Weight:   81.3 kg   Height:        Intake/Output Summary (Last 24 hours) at 01/09/2021 1816 Last data filed at 01/09/2021 1500 Gross per 24 hour  Intake 396.03 ml  Output 1200 ml  Net -803.97 ml   Filed Weights   01/07/21 0506 01/08/21 0500 01/09/21 0540  Weight: 80.9 kg 81.3 kg 81.3 kg    Examination:  General: No acute distress. Cardiovascular: RRR Lungs: unlabored Abdomen: Soft, nontender, nondistended Neurological: Alert and oriented 3. Moves all extremities 4. Cranial nerves II through XII grossly intact. Skin: Warm and dry. No rashes or lesions. Extremities: No clubbing or cyanosis. No edema.   Data Reviewed: I have personally reviewed following labs and imaging studies  CBC: Recent Labs  Lab 01/06/21 1456 01/07/21 0433 01/08/21 0425 01/09/21 0425  WBC 8.5 8.0 7.5 5.1  NEUTROABS 6.8  --   --  4.8  HGB 13.2 11.6* 11.4* 11.3*  HCT 37.8* 33.8* 33.5* 33.4*  MCV 97.9 98.0 98.5 99.4  PLT 206 223 216 119    Basic Metabolic Panel: Recent Labs  Lab 01/06/21 1456 01/07/21 0433 01/08/21 0425 01/09/21 0425 01/09/21 1404  NA 131* 133* 132* 130* 132*  K 5.0  4.4 4.4 4.6 4.5  CL 95* 99 100 100 99  CO2 22 25 24 22 23   GLUCOSE 126* 110* 113* 201* 236*  BUN 61* 62* 56* 60* 61*  CREATININE 6.09* 6.30* 5.93* 5.57* 5.34*  CALCIUM 9.6 9.3 9.2 9.2 9.2  MG  --   --  2.4 2.3  --   PHOS  --   --  4.7* 4.2  --     GFR: Estimated Creatinine Clearance: 12.3 mL/min (Shatasha Lambing) (by C-G formula based on SCr of 5.34 mg/dL (H)).  Liver Function Tests: Recent Labs  Lab 01/06/21 1456 01/07/21 0433 01/08/21 0425 01/09/21 0425 01/09/21 1404  AST 50* 39  --  107* 123*  ALT 35 46*  --  120* 114*  ALKPHOS 66 60  --  69 66  BILITOT 1.6* 1.1  --  0.7 0.7  PROT 6.6 5.4*  --  5.4* 5.3*  ALBUMIN 3.5 3.2* 3.1* 3.1* 2.9*    CBG: Recent Labs  Lab 01/08/21 1817 01/08/21 2316 01/09/21 0836 01/09/21 1138 01/09/21 1654  GLUCAP 110* 245* 159* 217* 197*     Recent Results (from the past 240 hour(s))  Resp Panel by RT-PCR (Flu Adayah Arocho&B, Covid) Nasopharyngeal Swab     Status: None   Collection Time: 01/06/21  4:35 PM   Specimen: Nasopharyngeal Swab; Nasopharyngeal(NP) swabs in vial transport medium  Result Value Ref Range Status   SARS Coronavirus 2 by RT PCR NEGATIVE NEGATIVE Final    Comment: (NOTE) SARS-CoV-2 target nucleic acids are NOT DETECTED.  The SARS-CoV-2 RNA is generally detectable in upper respiratory specimens during the acute phase of infection. The lowest concentration of SARS-CoV-2 viral copies this assay can detect is 138 copies/mL. Niema Carrara negative result does not preclude SARS-Cov-2 infection and should not be used as the sole basis for treatment or other patient management decisions. Jenner Rosier negative result may occur with  improper specimen collection/handling, submission of specimen other than nasopharyngeal swab, presence of viral mutation(s) within the areas targeted by this assay, and inadequate number of viral copies(<138 copies/mL). Braidyn Scorsone negative result must be combined with clinical observations, patient history, and epidemiological information. The  expected result is Negative.  Fact Sheet for Patients:  EntrepreneurPulse.com.au  Fact Sheet for Healthcare Providers:  IncredibleEmployment.be  This test is no t yet approved or cleared by the [place] FDA and  has been authorized for detection and/or diagnosis of SARS-CoV-2 by FDA under an Emergency Use Authorization (EUA). This EUA will remain  in effect (meaning this test can be used) for the duration of the COVID-19 declaration under Section 564(b)(1) of the Act, 21 U.S.C.section 360bbb-3(b)(1), unless the authorization is terminated  or revoked sooner.       Influenza Riann Oman by PCR NEGATIVE NEGATIVE Final   Influenza B by PCR NEGATIVE NEGATIVE Final    Comment: (NOTE) The Xpert Xpress SARS-CoV-2/FLU/RSV plus assay is intended as an aid in the diagnosis of influenza from Nasopharyngeal swab specimens and should not be used as Zykeria Laguardia sole basis for treatment. Nasal washings and aspirates are unacceptable for Xpert Xpress SARS-CoV-2/FLU/RSV testing.  Fact Sheet for Patients: EntrepreneurPulse.com.au  Fact Sheet for Healthcare Providers: IncredibleEmployment.be  This test is not yet approved or cleared by the [place] FDA and has been authorized for detection and/or diagnosis of SARS-CoV-2 by FDA under an Emergency Use Authorization (EUA). This EUA will remain in effect (meaning this test can be used) for the duration of the COVID-19 declaration under Section 564(b)(1) of the Act, 21 U.S.C. section 360bbb-3(b)(1), unless the authorization is terminated or revoked.  Performed at [facility], [address], [place], [place] [address]   Surgical pcr screen     Status: None   Collection Time: 01/08/21 12:54 PM   Specimen: Nasal Mucosa; Nasal Swab  Result Value Ref Range Status   MRSA, PCR NEGATIVE NEGATIVE Final   Staphylococcus aureus NEGATIVE NEGATIVE Final    Comment: (NOTE) The  Xpert SA Assay (FDA approved for NASAL specimens in patients 90 years of age and older), is one component of Waleed Dettman comprehensive surveillance program. It is not intended to diagnose infection nor to guide or monitor treatment. Performed at [facility], [place] [address] [place], [place] [address]          Radiology Studies: DG C-Arm 1-60 Min-No Report  Result Date: 01/08/2021 Fluoroscopy was utilized by the requesting physician.  No radiographic interpretation.   CT RENAL STONE STUDY  Result Date: 01/07/2021 CLINICAL DATA:  Acute renal failure EXAM: CT ABDOMEN AND PELVIS WITHOUT CONTRAST TECHNIQUE:  Multidetector CT imaging of the abdomen and pelvis was performed following the standard protocol without IV contrast. COMPARISON:  Ultrasound from earlier in the same day, CT from 04/11/2020. FINDINGS: Lower chest: Few calcified granulomas are noted in the right lung base. No focal infiltrate or sizable effusion is seen. Hepatobiliary: Gallbladder demonstrates multiple gallstones without complicating factors. The liver appears within normal limits. Pancreas: Unremarkable. No pancreatic ductal dilatation or surrounding inflammatory changes. Spleen: Normal in size without focal abnormality. Adrenals/Urinary Tract: Adrenal glands are within normal limits. Left kidney demonstrates Tiasha Helvie tiny nonobstructing stone. The ureter is within normal limits. The right kidney demonstrates increased perinephric stranding as well as hydronephrosis. This is secondary to Aseel Truxillo mid right ureteral stone best seen on image number 52 of series 2. This measures approximately 8-9 mm in greatest dimension. The more distal right ureter is within normal limits. Nonobstructing 8 mm stone is noted within the right kidney as well as Petra Sargeant smaller upper pole stone. Bladder is decompressed. Stomach/Bowel: Scattered diverticular change of the colon is noted without evidence of diverticulitis. The appendix is within normal limits.  Small bowel and stomach are unremarkable. Gastric lap band is noted in satisfactory position. Vascular/Lymphatic: Aortic atherosclerosis. No enlarged abdominal or pelvic lymph nodes. Reproductive: Prostate is unremarkable. Other: No abdominal wall hernia or abnormality. No abdominopelvic ascites. Musculoskeletal: Degenerative changes of lumbar spine are noted. IMPRESSION: Nonobstructing bilateral renal calculi. 8-9 mm stone in the mid right ureter causing obstructive change. This corresponds with that seen on recent ultrasound. Stable gastric lap band. Diverticulosis without diverticulitis. Electronically Signed   By: Inez Catalina M.D.   On: 01/07/2021 20:28        Scheduled Meds: . allopurinol  50 mg Oral Once per day on Mon Thu  . amiodarone  200 mg Oral Daily  . heparin  5,000 Units Subcutaneous Q8H  . insulin aspart  0-5 Units Subcutaneous QHS  . insulin aspart  0-9 Units Subcutaneous TID WC  . traZODone  100 mg Oral QHS   Continuous Infusions: . lactated ringers 100 mL/hr at 01/09/21 1539     LOS: 3 days    Time spent: over 30 min    Fayrene Helper, MD [facility]   To contact the attending provider between 7A-7P or the covering provider during after hours 7P-7A, please log into the web site [email or web address] and access using universal [place] password for that web site. If you do not have the password, please call the hospital operator.  01/09/2021, 6:16 PM

## 2021-01-09 NOTE — Plan of Care (Signed)
  Problem: Education: Goal: Knowledge of General Education information will improve Description: Including pain rating scale, medication(s)/side effects and non-pharmacologic comfort measures Outcome: Completed/Met   Problem: Clinical Measurements: Goal: Respiratory complications will improve Outcome: Completed/Met Goal: Cardiovascular complication will be avoided Outcome: Completed/Met   Problem: Nutrition: Goal: Adequate nutrition will be maintained Outcome: Completed/Met   Problem: Coping: Goal: Level of anxiety will decrease Outcome: Completed/Met   Problem: Elimination: Goal: Will not experience complications related to bowel motility Outcome: Completed/Met Goal: Will not experience complications related to urinary retention Outcome: Completed/Met   Problem: Pain Managment: Goal: General experience of comfort will improve Outcome: Completed/Met

## 2021-01-10 ENCOUNTER — Inpatient Hospital Stay (HOSPITAL_COMMUNITY): Payer: No Typology Code available for payment source

## 2021-01-10 DIAGNOSIS — N179 Acute kidney failure, unspecified: Secondary | ICD-10-CM | POA: Diagnosis not present

## 2021-01-10 LAB — CBC WITH DIFFERENTIAL/PLATELET
Abs Immature Granulocytes: 0.07 10*3/uL (ref 0.00–0.07)
Basophils Absolute: 0 10*3/uL (ref 0.0–0.1)
Basophils Relative: 0 %
Eosinophils Absolute: 0 10*3/uL (ref 0.0–0.5)
Eosinophils Relative: 0 %
HCT: 30.6 % — ABNORMAL LOW (ref 39.0–52.0)
Hemoglobin: 10.3 g/dL — ABNORMAL LOW (ref 13.0–17.0)
Immature Granulocytes: 1 %
Lymphocytes Relative: 6 %
Lymphs Abs: 0.6 10*3/uL — ABNORMAL LOW (ref 0.7–4.0)
MCH: 33.4 pg (ref 26.0–34.0)
MCHC: 33.7 g/dL (ref 30.0–36.0)
MCV: 99.4 fL (ref 80.0–100.0)
Monocytes Absolute: 0.5 10*3/uL (ref 0.1–1.0)
Monocytes Relative: 5 %
Neutro Abs: 9 10*3/uL — ABNORMAL HIGH (ref 1.7–7.7)
Neutrophils Relative %: 88 %
Platelets: 215 10*3/uL (ref 150–400)
RBC: 3.08 MIL/uL — ABNORMAL LOW (ref 4.22–5.81)
RDW: 14.8 % (ref 11.5–15.5)
WBC: 10.2 10*3/uL (ref 4.0–10.5)
nRBC: 0 % (ref 0.0–0.2)

## 2021-01-10 LAB — GLUCOSE, CAPILLARY: Glucose-Capillary: 129 mg/dL — ABNORMAL HIGH (ref 70–99)

## 2021-01-10 LAB — COMPREHENSIVE METABOLIC PANEL
ALT: 170 U/L — ABNORMAL HIGH (ref 0–44)
AST: 202 U/L — ABNORMAL HIGH (ref 15–41)
Albumin: 2.7 g/dL — ABNORMAL LOW (ref 3.5–5.0)
Alkaline Phosphatase: 61 U/L (ref 38–126)
Anion gap: 6 (ref 5–15)
BUN: 56 mg/dL — ABNORMAL HIGH (ref 8–23)
CO2: 25 mmol/L (ref 22–32)
Calcium: 8.9 mg/dL (ref 8.9–10.3)
Chloride: 102 mmol/L (ref 98–111)
Creatinine, Ser: 4.81 mg/dL — ABNORMAL HIGH (ref 0.61–1.24)
GFR, Estimated: 12 mL/min — ABNORMAL LOW (ref >60)
Glucose, Bld: 149 mg/dL — ABNORMAL HIGH (ref 70–99)
Potassium: 4.5 mmol/L (ref 3.5–5.1)
Sodium: 133 mmol/L — ABNORMAL LOW (ref 135–145)
Total Bilirubin: 0.6 mg/dL (ref 0.3–1.2)
Total Protein: 4.9 g/dL — ABNORMAL LOW (ref 6.5–8.1)

## 2021-01-10 LAB — PHOSPHORUS: Phosphorus: 3.7 mg/dL (ref 2.5–4.6)

## 2021-01-10 LAB — MAGNESIUM: Magnesium: 2 mg/dL (ref 1.7–2.4)

## 2021-01-10 MED ORDER — ALLOPURINOL 100 MG PO TABS: 50.0000 mg | ORAL_TABLET | ORAL | 0 refills | Status: DC

## 2021-01-10 NOTE — Discharge Summary (Addendum)
Physician Discharge Summary  [name] NKN:[id number] DOB: 07-12-1946 DOA: 01/06/2021  PCP: [name], MD  Admit date: 01/06/2021 Discharge date: 01/10/2021  Time spent: 40 minutes  Recommendations for Outpatient Follow-up:  1. Follow outpatient CBC/CMP 2. Attention to LFT's and renal function at walk in clinic on 5/21 -> expect renal function to continue to improve.  LFT's suspect hemodynamically mediated vs medications.  If LFT's with concerning rise, may need to return to acute care, otherwise, would continue to trend and reevaluate meds and workup as appropriate.   3. Holding lasix/potassium - follow volume status, lytes, creatinine outpatient - resume when indicated 4. Allopurinol reduced to 50 mg twice weekly based on renal function 5. Was taking amiodarone 200 mg twice daily at home, though most recent note from Dr. Rayann Heman has once daily dosing noted (12/04/20).  Discussed with wife, she's going to decrease to daily dosing.  Follow outpatient with cardiology.  6. wenkebach - follow outpatient with cards Pressure injury - appears more c/w deep tissue injury to me, maybe some MASD as well - relieve pressure to area, wash area with soap and water, pat dry, keep free of moisture - follow outpatient with PCP 7.   Discharge Diagnoses:  Principal Problem:   AKI (acute kidney injury) ([place]) Active Problems:   Poorly controlled type 2 diabetes mellitus with circulatory disorder (HCC)   Essential hypertension   CAD, AUTOLOGOUS BYPASS GRAFT   Obstructive sleep apnea   S/P minimally invasive mitral valve repair   Generalized weakness   ARF (acute renal failure) (HCC)   Pressure injury of skin   Discharge Condition: stable  Diet recommendation: heart healthy, diabetic  Filed Weights   01/08/21 0500 01/09/21 0540 01/10/21 0427  Weight: 81.3 kg 81.3 kg 82.4 kg    History of present illness:  75 year old M with PMH of CAD with remote CABG, systolic CHF (37-90%), DM-2, A. fib  on Eliquis, OSA, HTN, HLD, CKD-74fllowed by Dr. KJohnney Ou depression, recent COVID-19 infection (positive on 5/1) and hypogonadism [facility] with progressive weakness, fatigue and decreased oral intake that has acutely gotten worse overfor 3 to 4 days prior to admission.  He was found to have acute kidney injury and imaging shows a 8-9 mm stone in the mid right ureter causing obstructive change.  Urology has been consulted and patient is now s/p holmium laser/dusting of R ureteral stone, placement of 6 french by 28 cm contour double j stent.  Creatinine improving, he's feeling better, but now hospitalization complicated by acute liver injury.  His grandson's wedding is today and while LFT's rising, I don't think it precludes discharge with close follow up with his improved overall symptoms.  Discussed need for follow up with repeat labs tomorrow in walk in clinic.  Stable for discharge on 5/20.  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 -CT stone study with 8-9 mm stone in the mid right ureter causing obstructive change -UA with RBC's, large Hb -urology c/s, appreciate recs -s/p cystoscopy, right retrograde ureteropyelogram, fluoroscopic interpretation, right ureteroscopy, holmium laser/dusting of right ureteral stone, placement of 6 french by 28 cm contour double J stent on 5/18  - discussed with Dr. DPierce Crane ok with resuming anticoagulation, will plan for tomorrow - renal function improving, follow outpatient -Renal ultrasound (right hydro and nephrolithiasis), uric acid (4.3), CK (34) -Needs repeat UA outpatient -follow outpatient with urology, renal   Elevated Liver Enzymes Related to meds? Unclear at this  time.  Hypovolemia/shock liver? (no clear hypotension noted). Acute hepatitis panel, follow labs - negative Rising today, most meds he's being given are home meds - some can potentially cause liver injury (praluent, amiodarone,  allopurinol, colchicine, protonix, etc), but in setting of acute illness/rise in LFT's, I think hemodynamically mediated liver injury may be most likely (shock liver) - we planned for follow up 5/21 in walk in clinic to follow up labs (grandson's wedding today).  RUQ [place] with gallstones, but otherwise unremarkable.  Hopefully will plateau and improve soon, though if concerning rise, or continued persistent rise, may need to return for more extensive workup.  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 Likely hypovolemic. -improved, follow  Chronic systolic CHF:TTE in 0/6301 with LVEF of 45 to 50%, global hypokinesis indeterminate DD severe LAE and moderate MVR. Patient appears euvolemic on exam. No cardiopulmonary symptoms. -Continue holding Lasix/potassium at discharge -Monitor fluid and respiratory status with additional IVF -Strict I/O, daily weights  CAD with remote CABG: No anginal symptoms. -Not on medication.  ControlledNIDDM-2with hyperglycemia: A1c 6.0% in 09/2020.  Resume home meds.   OSA on CPAP -Continue nightly CPAP  Paroxysmal A. fib on Eliquis -Continue home amiodarone and Eliquis (resume on discharge)  Essential hypertension?Normotensive.Does not seem to be on medication for this.  Holding lasix at discharge  Hyperlipidemia -not on statin  - follow praluent outpatient - follow with elevated LFT's  Depression: Stable -Continue home medications - trazodone,   Hypogonadism:Seems to be a testosterone injection biweekly. Last injection in October 2021. Does not explain patient's acute weakness. -Outpatient follow-up  Recent COVID-19 infection:Reportedly tested positive on 5/1 but did not require hospitalization. COVID-19 PCR negative on  admission. -No indication for airborne or contact precaution  Generalized weakness-likely in the setting of poor p.o. intake, AKI and recent COVID-19 infection. Improving. -PT/OT eval - no follow up recommended  Pressure ulcer To me looked more consistent with deep tissue injury, needs close follow up outpatient.  Maybe some MASD as well.  Offload, wash with soap/water, pat dry, keep free of moisture.  Pressure Injury 01/07/21 Buttocks Medial;Upper Stage 2 -  Partial thickness loss of dermis presenting as a shallow open injury with a red, pink wound bed without slough. (Active)  01/07/21 1246  Location: Buttocks  Location Orientation: Medial;Upper  Staging: Stage 2 -  Partial thickness loss of dermis presenting as a shallow open injury with a red, pink wound bed without slough.  Wound Description (Comments):   Present on Admission: Yes       Procedures: 5/18 Cystoscopy, right retrograde ureteropyelogram, fluoroscopic interpretation, right ureteroscopy, holmium laser/dusting of right ureteral stone, placement of 6 French by 28 cm contour double-J stent without tether  Consultations:  urology  Discharge Exam: Vitals:   01/09/21 2110 01/10/21 0427  BP:  (!) 142/77  Pulse:  (!) 56  Resp: (!) 21   Temp:  97.9 F (36.6 C)  SpO2:  98%   Feeling back to normal No new complaints today Discussed d/c plan extensively with patient and his wife - grandson's wedding today, even though LFT's rising, since he's improving, I think ok to discharge and have him follow up in walk in clinic tomorrow - avoid etoh/tylenol.  Stay hydrated.    General: No acute distress. Cardiovascular: Heart sounds show a regular rate, and  rhythm Lungs: Clear to auscultation bilaterally  Abdomen: Soft, nontender, nondistended  Neurological: Alert and oriented 3. Moves all extremities 4. Cranial nerves II through XII grossly intact. Skin: Warm and dry. No rashes or lesions. Extremities: No clubbing or  cyanosis. No edema.  Discharge Instructions   Discharge Instructions    Call MD for:  difficulty breathing, headache or visual disturbances   Complete by: As directed    Call MD for:  extreme fatigue   Complete by: As directed    Call MD for:  hives   Complete by: As directed    Call MD for:  persistant dizziness or light-headedness   Complete by: As directed    Call MD for:  persistant nausea and vomiting   Complete by: As directed    Call MD for:  redness, tenderness, or signs of infection (pain, swelling, redness, odor or green/yellow discharge around incision site)   Complete by: As directed    Call MD for:  severe uncontrolled pain   Complete by: As directed    Call MD for:  temperature >100.4   Complete by: As directed    Diet - low sodium heart healthy   Complete by: As directed    Discharge instructions   Complete by: As directed    You were seen with malaise, weakness, acute kidney injury, and poor oral intake.  You had imaging findings that showed an obstructing stone which has been treated by urology with stent placement and holmium laser/dusting.    Your kidney function is slowly improving.  Stop your lasix and your potassium until you're told to resume this by your PCP or nephrologist (kidney doctor).  Follow your weights daily.  If your weights begin to trend up by 2-3 lbs in a day or 5 lbs in a week, call your PCP or nephrologist for additional instructions.  We've made some adjustments to other medications like allopurinol with your decreased kidney function, continue 50 mg twice weekly while your renal function is reduced.  Follow with your PCP for further adjustments to medications and doses.    Your liver enzymes are rising.  It's unclear exactly what's caused your abnormal liver function.  I suspect this maybe hemodynamically mediated in the setting of your poor oral intake and acute kidney injury on presentation (shock liver).  But, medications are also a common  cause of liver injury in the hospital.  Your AST was 202 on the day of discharge and your ALT was 170.  Your bilirubin and alk phos were normal.  Your ultrasound showed gallstones, but no other concerning findings.  Some of your medicines can cause liver injury, but I think it's less likely as they were home medicines and your liver function was normal on presentation (ask your PCP about your praluent, allopurinol, etc. and other medicines if your LFT's don't improve).  Please follow up at the walk in clinic tomorrow for repeat labs to ensure your liver function is not significantly worsening and to follow your kidney function.  Please repeat your labs again early next week to follow the trend (follow instructions of the provider you see tomorrow).  Do not drink alcohol until we know your liver function is normalized.  Stay hydrated.  Avoid tylenol for now.  Follow outpatient with urology for your stent.  Follow with nephrology outpatient.  Follow with cardiology outpatient.  Follow with your PCP within 1 week.  Return for new, recurrent, or worsening symptoms.  Please ask your PCP to  request records from this hospitalization so they know what was done and what the next steps will be.   Increase activity slowly   Complete by: As directed    No wound care   Complete by: As directed      Allergies as of 01/10/2021      Reactions   Crestor [rosuvastatin] Other (See Comments)   Muscle aches   Lipitor [atorvastatin] Other (See Comments)   Muscle aches   Pravastatin Other (See Comments)   Muscle aches   Carvedilol    Other reaction(s): loss of appetite   Metformin Diarrhea   Serotonin Reuptake Inhibitors (ssris)    Other reaction(s): Muscle aches   Zocor [simvastatin]    Other reaction(s): Muscle pain   Codeine Itching, Other (See Comments)   Extremity tingling-- can take synthetic      Medication List    STOP taking these medications   amoxicillin-clavulanate 875-125 MG  tablet Commonly known as: AUGMENTIN   furosemide 40 MG tablet Commonly known as: LASIX   potassium chloride SA 20 MEQ tablet Commonly known as: KLOR-CON     TAKE these medications   allopurinol 100 MG tablet Commonly known as: ZYLOPRIM Take 0.5 tablets (50 mg total) by mouth 2 (two) times a week. Please discuss dosing with your PCP based on your renal function. Start taking on: Jan 13, 2021 What changed:   how much to take  when to take this  additional instructions   alprostadil 10 MCG injection Commonly known as: EDEX 10 mcg by Intracavitary route daily as needed for erectile dysfunction.   amiodarone 200 MG tablet Commonly known as: PACERONE Take 1 tablet (200 mg total) by mouth daily. What changed: when to take this   apixaban 5 MG Tabs tablet Commonly known as: Eliquis Take 1 tablet (5 mg total) by mouth 2 (two) times daily.   calcitRIOL 0.25 MCG capsule Commonly known as: ROCALTROL Take 0.25 mcg by mouth every other day.   Carboxymethylcellulose Sodium 1 % Gel Place 1 drop into both eyes daily as needed (dry eyes).   cetirizine 10 MG tablet Commonly known as: ZYRTEC Take 10 mg by mouth daily.   colchicine 0.6 MG tablet Take 0.6-1.2 mg by mouth 2 (two) times daily as needed (gout).   fluticasone 50 MCG/ACT nasal spray Commonly known as: FLONASE Place 1 spray into both nostrils daily as needed for allergies or rhinitis.   HYDROcodone-acetaminophen 5-325 MG tablet Commonly known as: NORCO/VICODIN Take 1 tablet by mouth daily as needed for severe pain.   MOUTH KOTE MT Use as directed 4 sprays in the mouth or throat every 3 (three) hours as needed (for dry mouth).   nitroGLYCERIN 0.4 MG SL tablet Commonly known as: NITROSTAT 1 TAB UNDER THE TONGUE EVERY 5 MINUTES AS NEEDED FOR CHEST PAIN UP TO 3 DOSES, IF PERSIST CALL 911 What changed:   how much to take  how to take this  when to take this  reasons to take this  additional instructions    Ozempic (0.25 or 0.5 MG/DOSE) 2 MG/1.5ML Sopn Generic drug: Semaglutide(0.25 or 0.5MG/DOS) Inject 0.375 mLs (0.5 mg total) into the skin every Thursday.   pantoprazole 40 MG tablet Commonly known as: PROTONIX Take 40 mg by mouth daily as needed (heartburn).   Praluent 75 MG/ML Soaj Generic drug: Alirocumab Inject 75 mg into the skin every 14 (fourteen) days.   sildenafil 100 MG tablet Commonly known as: VIAGRA Take 1 tablet by mouth daily as needed  for erectile dysfunction.   tadalafil 20 MG tablet Commonly known as: CIALIS TAKE 1 TABLET BY MOUTH ONCE DAILY AS NEEDED What changed: reasons to take this   testosterone cypionate 200 MG/ML injection Commonly known as: DEPOTESTOSTERONE CYPIONATE Inject 200 mg into the muscle every 14 (fourteen) days.   traZODone 100 MG tablet Commonly known as: DESYREL Take 100 mg by mouth at bedtime.   vitamin C with rose hips 1000 MG tablet Take 1,000 mg by mouth daily.      Allergies  Allergen Reactions  . Crestor [Rosuvastatin] Other (See Comments)    Muscle aches   . Lipitor [Atorvastatin] Other (See Comments)    Muscle aches  . Pravastatin Other (See Comments)    Muscle aches  . Carvedilol     Other reaction(s): loss of appetite  . Metformin Diarrhea  . Serotonin Reuptake Inhibitors (Ssris)     Other reaction(s): Muscle aches  . Zocor [Simvastatin]     Other reaction(s): Muscle pain  . Codeine Itching and Other (See Comments)    Extremity tingling-- can take synthetic    Follow-up Information    [name], MD Follow up.   Specialty: Family Medicine Contact information: [place] [address] [phone]        [name], MD .   Specialty: Cardiology Contact information: [place] [address] [phone]        Thompson Grayer, MD .   Specialty: Cardiology Contact information: [place] [address] [phone]         Franchot Gallo, MD Follow up.   Specialty: Urology Contact information: [place] [address] [phone]        Justin Mend, MD Follow up.   Specialty: Internal Medicine Contact information: [place] [address] [phone]                The results of significant diagnostics from this hospitalization (including imaging, microbiology, ancillary and laboratory) are listed below for reference.    Significant Diagnostic Studies: CT Head Wo Contrast  Result Date: 01/06/2021 CLINICAL DATA:  Increasing falls, initial encounter EXAM: CT HEAD WITHOUT CONTRAST TECHNIQUE: Contiguous axial images were obtained from the base of the skull through the vertex without intravenous contrast. COMPARISON:  10/04/2020 FINDINGS: Brain: No evidence of acute infarction, hemorrhage, hydrocephalus, extra-axial collection or mass lesion/mass effect. Mild atrophic and ischemic changes are noted. Vascular: No hyperdense vessel or unexpected calcification. Skull: Normal. Negative for fracture or focal lesion. Sinuses/Orbits: No acute finding. Other: None. IMPRESSION: Mild atrophic and ischemic changes stable from the prior exam. No acute abnormality noted. Electronically Signed   By: Inez Catalina M.D.   On: 01/06/2021 16:04   US RENAL  Result Date: 01/07/2021 CLINICAL DATA:  Acute kidney injury EXAM: RENAL / URINARY TRACT ULTRASOUND COMPLETE COMPARISON:  CT 04/11/2020 FINDINGS: Right Kidney: Renal measurements: 12.4 x 5.6 x 6.6 cm = volume: 238 mL. Echogenicity within normal limits. Mild hydronephrosis, new since previous. Shadowing calculi largest 1.2 cm in the lower pole collecting system. Left Kidney: Renal measurements: 9.7 x 5.5 x 5.2 cm = volume: 145 mL. Echogenicity within normal limits. No mass or hydronephrosis visualized. Bladder: Appears normal for degree of bladder distention. Other: None. IMPRESSION: 1. Right hydronephrosis and nephrolithiasis. 2. Left kidney  and bladder unremarkable. Electronically Signed   By: Lucrezia Europe M.D.   On: 01/07/2021 16:13   DG Chest Portable  1 View  Result Date: 01/06/2021 CLINICAL DATA:  COVID positive with cough EXAM: PORTABLE CHEST 1 VIEW COMPARISON:  12/31/2020 FINDINGS: Post sternotomy changes. No focal opacity or pleural effusion. Stable cardiomegaly. No pneumothorax IMPRESSION: No active disease. Electronically Signed   By: Donavan Foil M.D.   On: 01/06/2021 15:28   DG C-Arm 1-60 Min-No Report  Result Date: 01/08/2021 Fluoroscopy was utilized by the requesting physician.  No radiographic interpretation.   CT RENAL STONE STUDY  Result Date: 01/07/2021 CLINICAL DATA:  Acute renal failure EXAM: CT ABDOMEN AND PELVIS WITHOUT CONTRAST TECHNIQUE: Multidetector CT imaging of the abdomen and pelvis was performed following the standard protocol without IV contrast. COMPARISON:  Ultrasound from earlier in the same day, CT from 04/11/2020. FINDINGS: Lower chest: Few calcified granulomas are noted in the right lung base. No focal infiltrate or sizable effusion is seen. Hepatobiliary: Gallbladder demonstrates multiple gallstones without complicating factors. The liver appears within normal limits. Pancreas: Unremarkable. No pancreatic ductal dilatation or surrounding inflammatory changes. Spleen: Normal in size without focal abnormality. Adrenals/Urinary Tract: Adrenal glands are within normal limits. Left kidney demonstrates a tiny nonobstructing stone. The ureter is within normal limits. The right kidney demonstrates increased perinephric stranding as well as hydronephrosis. This is secondary to a mid right ureteral stone best seen on image number 52 of series 2. This measures approximately 8-9 mm in greatest dimension. The more distal right ureter is within normal limits. Nonobstructing 8 mm stone is noted within the right kidney as well as a smaller upper pole stone. Bladder is decompressed. Stomach/Bowel: Scattered diverticular  change of the colon is noted without evidence of diverticulitis. The appendix is within normal limits. Small bowel and stomach are unremarkable. Gastric lap band is noted in satisfactory position. Vascular/Lymphatic: Aortic atherosclerosis. No enlarged abdominal or pelvic lymph nodes. Reproductive: Prostate is unremarkable. Other: No abdominal wall hernia or abnormality. No abdominopelvic ascites. Musculoskeletal: Degenerative changes of lumbar spine are noted. IMPRESSION: Nonobstructing bilateral renal calculi. 8-9 mm stone in the mid right ureter causing obstructive change. This corresponds with that seen on recent ultrasound. Stable gastric lap band. Diverticulosis without diverticulitis. Electronically Signed   By: Inez Catalina M.D.   On: 01/07/2021 20:28   US Abdomen Limited RUQ (LIVER/GB)  Result Date: 01/10/2021 CLINICAL DATA:  Elevated liver enzymes EXAM: ULTRASOUND ABDOMEN LIMITED RIGHT UPPER QUADRANT COMPARISON:  CT abdomen and pelvis Jan 07, 2021 FINDINGS: Gallbladder: Small layering gallstones are noted, better delineated on recent CT. Largest gallstone measures 3-4 mm. No gallbladder wall thickening or pericholecystic fluid. No sonographic Murphy sign noted by sonographer. Common bile duct: Diameter: 5 mm. No intrahepatic or extrahepatic biliary duct dilatation. Liver: No focal lesion identified. Within normal limits in parenchymal echogenicity. Portal vein is patent on color Doppler imaging with normal direction of blood flow towards the liver. Other: None. IMPRESSION: Gallstones seen to layer posteriorly, better delineated on recent CT examination. No gallbladder wall thickening or pericholecystic fluid appreciable. Study otherwise unremarkable. Electronically Signed   By: Lowella Grip III M.D.   On: 01/10/2021 09:10    Microbiology: Recent Results (from the past 240 hour(s))  Resp Panel by RT-PCR (Flu A&B, Covid) Nasopharyngeal Swab     Status: None   Collection Time: 01/06/21  4:35 PM    Specimen: Nasopharyngeal Swab; Nasopharyngeal(NP) swabs in vial transport medium  Result Value Ref Range Status   SARS Coronavirus 2 by RT PCR NEGATIVE NEGATIVE Final    Comment: (NOTE) SARS-CoV-2 target nucleic acids are NOT DETECTED.  The SARS-CoV-2 RNA is generally detectable in upper respiratory specimens during the acute phase of infection. The lowest concentration of SARS-CoV-2 viral copies this assay can detect is 138 copies/mL. Nayelis Bonito negative result does not preclude SARS-Cov-2 infection and should not be used as the sole basis for treatment or other patient management decisions. Olga Seyler negative result may occur with  improper specimen collection/handling, submission of specimen other than nasopharyngeal swab, presence of viral mutation(s) within the areas targeted by this assay, and inadequate number of viral copies(<138 copies/mL). Atsushi Yom negative result must be combined with clinical observations, patient history, and epidemiological information. The expected result is Negative.  Fact Sheet for Patients:  EntrepreneurPulse.com.au  Fact Sheet for Healthcare Providers:  IncredibleEmployment.be  This test is no t yet approved or cleared by the [place] FDA and  has been authorized for detection and/or diagnosis of SARS-CoV-2 by FDA under an Emergency Use Authorization (EUA). This EUA will remain  in effect (meaning this test can be used) for the duration of the COVID-19 declaration under Section 564(b)(1) of the Act, 21 U.S.C.section 360bbb-3(b)(1), unless the authorization is terminated  or revoked sooner.       Influenza Amarah Brossman by PCR NEGATIVE NEGATIVE Final   Influenza B by PCR NEGATIVE NEGATIVE Final    Comment: (NOTE) The Xpert Xpress SARS-CoV-2/FLU/RSV plus assay is intended as an aid in the diagnosis of influenza from Nasopharyngeal swab specimens and should not be used as Avril Busser sole basis for treatment. Nasal washings and aspirates are  unacceptable for Xpert Xpress SARS-CoV-2/FLU/RSV testing.  Fact Sheet for Patients: EntrepreneurPulse.com.au  Fact Sheet for Healthcare Providers: IncredibleEmployment.be  This test is not yet approved or cleared by the [place] FDA and has been authorized for detection and/or diagnosis of SARS-CoV-2 by FDA under an Emergency Use Authorization (EUA). This EUA will remain in effect (meaning this test can be used) for the duration of the COVID-19 declaration under Section 564(b)(1) of the Act, 21 U.S.C. section 360bbb-3(b)(1), unless the authorization is terminated or revoked.  Performed at [facility], [address] [place], [place] [address]   Surgical pcr screen     Status: None   Collection Time: 01/08/21 12:54 PM   Specimen: Nasal Mucosa; Nasal Swab  Result Value Ref Range Status   MRSA, PCR NEGATIVE NEGATIVE Final   Staphylococcus aureus NEGATIVE NEGATIVE Final    Comment: (NOTE) The Xpert SA Assay (FDA approved for NASAL specimens in patients 52 years of age and older), is one component of Imara Standiford comprehensive surveillance program. It is not intended to diagnose infection nor to guide or monitor treatment. Performed at [facility], [place] [address], [place], [place] [address]      Labs: Basic Metabolic Panel: Recent Labs  Lab 01/07/21 0433 01/08/21 0425 01/09/21 0425 01/09/21 1404 01/10/21 0414  NA 133* 132* 130* 132* 133*  K 4.4 4.4 4.6 4.5 4.5  CL 99 100 100 99 102  CO2 _0 GLUCOSE 110* 113* 201* 236* 149*  BUN 62* 56* 60* 61* 56*  CREATININE 6.30* 5.93* 5.57* 5.34* 4.81*  CALCIUM 9.3 9.2 9.2 9.2 8.9  MG  --  2.4 2.3  --  2.0  PHOS  --  4.7* 4.2  --  3.7   Liver Function Tests: Recent Labs  Lab 01/06/21 1456 01/07/21 0433 01/08/21 0425 01/09/21 0425 01/09/21 1404 01/10/21 0414  AST 50* 39  --  107* 123* 202*  ALT 35 46*  --  120* 114* 170*  ALKPHOS 66 60  --  69 66  61  BILITOT 1.6* 1.1  --  0.7 0.7 0.6  PROT 6.6 5.4*  --  5.4* 5.3* 4.9*  ALBUMIN 3.5 3.2* 3.1* 3.1* 2.9* 2.7*   No results for input(s): LIPASE, AMYLASE in the last 168 hours. No results for input(s): AMMONIA in the last 168 hours. CBC: Recent Labs  Lab 01/06/21 1456 01/07/21 0433 01/08/21 0425 01/09/21 0425 01/10/21 0414  WBC 8.5 8.0 7.5 5.1 10.2  NEUTROABS 6.8  --   --  4.8 9.0*  HGB 13.2 11.6* 11.4* 11.3* 10.3*  HCT 37.8* 33.8* 33.5* 33.4* 30.6*  MCV 97.9 98.0 98.5 99.4 99.4  PLT 206 223 216 215 215   Cardiac Enzymes: Recent Labs  Lab 01/07/21 1245  CKTOTAL 34*   BNP: BNP (last 3 results) Recent Labs    03/07/20 1029 10/04/20 1331  BNP 161.1* 120.8*    ProBNP (last 3 results) No results for input(s): PROBNP in the last 8760 hours.  CBG: Recent Labs  Lab 01/09/21 0836 01/09/21 1138 01/09/21 1654 01/09/21 2021 01/10/21 0835  GLUCAP 159* 217* 197* 152* 129*       Signed:  Fayrene Helper MD.  [facility] 01/10/2021, 10:40 AM

## 2021-01-11 DIAGNOSIS — R945 Abnormal results of liver function studies: Secondary | ICD-10-CM | POA: Diagnosis not present

## 2021-01-11 DIAGNOSIS — R1031 Right lower quadrant pain: Secondary | ICD-10-CM | POA: Diagnosis not present

## 2021-01-11 DIAGNOSIS — R7989 Other specified abnormal findings of blood chemistry: Secondary | ICD-10-CM | POA: Diagnosis not present

## 2021-01-11 DIAGNOSIS — N200 Calculus of kidney: Secondary | ICD-10-CM | POA: Diagnosis not present

## 2021-01-14 DIAGNOSIS — Z09 Encounter for follow-up examination after completed treatment for conditions other than malignant neoplasm: Secondary | ICD-10-CM | POA: Diagnosis not present

## 2021-01-14 DIAGNOSIS — R0989 Other specified symptoms and signs involving the circulatory and respiratory systems: Secondary | ICD-10-CM | POA: Diagnosis not present

## 2021-01-14 DIAGNOSIS — N184 Chronic kidney disease, stage 4 (severe): Secondary | ICD-10-CM | POA: Diagnosis not present

## 2021-01-14 DIAGNOSIS — R945 Abnormal results of liver function studies: Secondary | ICD-10-CM | POA: Diagnosis not present

## 2021-01-14 DIAGNOSIS — N179 Acute kidney failure, unspecified: Secondary | ICD-10-CM | POA: Diagnosis not present

## 2021-01-14 DIAGNOSIS — N200 Calculus of kidney: Secondary | ICD-10-CM | POA: Diagnosis not present

## 2021-01-16 DIAGNOSIS — I509 Heart failure, unspecified: Secondary | ICD-10-CM | POA: Diagnosis not present

## 2021-01-16 DIAGNOSIS — D631 Anemia in chronic kidney disease: Secondary | ICD-10-CM | POA: Diagnosis not present

## 2021-01-16 DIAGNOSIS — N200 Calculus of kidney: Secondary | ICD-10-CM | POA: Diagnosis not present

## 2021-01-16 DIAGNOSIS — M109 Gout, unspecified: Secondary | ICD-10-CM | POA: Diagnosis not present

## 2021-01-16 DIAGNOSIS — E1122 Type 2 diabetes mellitus with diabetic chronic kidney disease: Secondary | ICD-10-CM | POA: Diagnosis not present

## 2021-01-16 DIAGNOSIS — I4891 Unspecified atrial fibrillation: Secondary | ICD-10-CM | POA: Diagnosis not present

## 2021-01-16 DIAGNOSIS — N2581 Secondary hyperparathyroidism of renal origin: Secondary | ICD-10-CM | POA: Diagnosis not present

## 2021-01-16 DIAGNOSIS — N185 Chronic kidney disease, stage 5: Secondary | ICD-10-CM | POA: Diagnosis not present

## 2021-01-16 DIAGNOSIS — I120 Hypertensive chronic kidney disease with stage 5 chronic kidney disease or end stage renal disease: Secondary | ICD-10-CM | POA: Diagnosis not present

## 2021-01-23 DIAGNOSIS — R0989 Other specified symptoms and signs involving the circulatory and respiratory systems: Secondary | ICD-10-CM | POA: Diagnosis not present

## 2021-01-23 DIAGNOSIS — N179 Acute kidney failure, unspecified: Secondary | ICD-10-CM | POA: Diagnosis not present

## 2021-01-23 DIAGNOSIS — R945 Abnormal results of liver function studies: Secondary | ICD-10-CM | POA: Diagnosis not present

## 2021-01-23 DIAGNOSIS — R5381 Other malaise: Secondary | ICD-10-CM | POA: Diagnosis not present

## 2021-01-23 DIAGNOSIS — Z09 Encounter for follow-up examination after completed treatment for conditions other than malignant neoplasm: Secondary | ICD-10-CM | POA: Diagnosis not present

## 2021-01-23 DIAGNOSIS — N200 Calculus of kidney: Secondary | ICD-10-CM | POA: Diagnosis not present

## 2021-01-23 DIAGNOSIS — N184 Chronic kidney disease, stage 4 (severe): Secondary | ICD-10-CM | POA: Diagnosis not present

## 2021-01-27 DIAGNOSIS — R634 Abnormal weight loss: Secondary | ICD-10-CM | POA: Diagnosis not present

## 2021-01-27 DIAGNOSIS — E1122 Type 2 diabetes mellitus with diabetic chronic kidney disease: Secondary | ICD-10-CM | POA: Diagnosis not present

## 2021-01-27 DIAGNOSIS — D539 Nutritional anemia, unspecified: Secondary | ICD-10-CM | POA: Diagnosis not present

## 2021-01-27 DIAGNOSIS — R5383 Other fatigue: Secondary | ICD-10-CM | POA: Diagnosis not present

## 2021-01-27 DIAGNOSIS — E291 Testicular hypofunction: Secondary | ICD-10-CM | POA: Diagnosis not present

## 2021-01-27 DIAGNOSIS — U071 COVID-19: Secondary | ICD-10-CM | POA: Diagnosis not present

## 2021-01-28 DIAGNOSIS — N201 Calculus of ureter: Secondary | ICD-10-CM | POA: Diagnosis not present

## 2021-01-28 DIAGNOSIS — R972 Elevated prostate specific antigen [PSA]: Secondary | ICD-10-CM | POA: Diagnosis not present

## 2021-01-29 ENCOUNTER — Ambulatory Visit (INDEPENDENT_AMBULATORY_CARE_PROVIDER_SITE_OTHER): Payer: Medicare Other | Admitting: Nurse Practitioner

## 2021-01-29 VITALS — BP 123/62 | HR 56 | Temp 97.9°F | Resp 18

## 2021-01-29 DIAGNOSIS — R439 Unspecified disturbances of smell and taste: Secondary | ICD-10-CM

## 2021-01-29 DIAGNOSIS — Z8616 Personal history of COVID-19: Secondary | ICD-10-CM | POA: Insufficient documentation

## 2021-01-29 DIAGNOSIS — R413 Other amnesia: Secondary | ICD-10-CM | POA: Diagnosis not present

## 2021-01-29 DIAGNOSIS — R5381 Other malaise: Secondary | ICD-10-CM

## 2021-01-29 DIAGNOSIS — R5383 Other fatigue: Secondary | ICD-10-CM | POA: Diagnosis not present

## 2021-01-29 DIAGNOSIS — R269 Unspecified abnormalities of gait and mobility: Secondary | ICD-10-CM

## 2021-01-29 NOTE — Patient Instructions (Addendum)
Covid 19 Fatigue Gait disturbance Memory loss Loss of appetite Altered taste and smell:   Stay well hydrated  Stay active  Deep breathing exercises  May take tylenolf or fever or pain  Will order PT  Will order Speech  Will place referral to neurology - Dr. Brett Fairy - altered taste and smell, altered gait, memory loss  Please eat 6 small meals / large snacks per day that are high protein low carb  Follow up:  Follow up in 3 months or sooner if needed

## 2021-01-29 NOTE — Progress Notes (Signed)
@Patient  ID: [name], male    DOB: 06-08-1946, 75 y.o.   MRN: [id number]  Chief Complaint  Patient presents with  . History of covid    Referring provider: [name], MD    HPI  Patient presents today for post-COVID care clinic visit.  Patient states that he has been sick with COVID twice.  The first time was follow-up last year the most recent was May 2022.  Patient complains today of ongoing loss of taste and smell, decreased appetite with weight loss, fatigue, brain fog and memory loss, sleep disturbance.  Patient does have a history of CHF, CKD, diabetes and is followed by cardiology, nephrology, endocrinology.  Patient also has sleep apnea and is compliant with CPAP he is followed by pulmonary for sleep apnea.  He has had extensive blood work done since diagnosed with COVID.  Patient is fully vaccinated. Denies f/c/s, n/v/d, hemoptysis, PND, chest pain or edema.      Allergies  Allergen Reactions  . Crestor [Rosuvastatin] Other (See Comments)    Muscle aches   . Lipitor [Atorvastatin] Other (See Comments)    Muscle aches  . Pravastatin Other (See Comments)    Muscle aches  . Carvedilol     Other reaction(s): loss of appetite  . Metformin Diarrhea  . Serotonin Reuptake Inhibitors (Ssris)     Other reaction(s): Muscle aches  . Zocor [Simvastatin]     Other reaction(s): Muscle pain  . Codeine Itching and Other (See Comments)    Extremity tingling-- can take synthetic    Immunization History  Administered Date(s) Administered  . Influenza Split 05/05/2011, 08/20/2012    Past Medical History:  Diagnosis Date  . Allergic rhinitis   . Anxiety   . Atrial fibrillation ([place])   . Cancer ([place])    hx of skin cancers   . Chronic kidney disease   . Coronary artery disease    a. s/p MI & CABG; b. s/p PCI Diag;  c. Cath 12/2011: LAD diff dzs/small, Diag 75% isr, 90 dist to stent (small), LCX & RCA occluded, VG->OM 40, VG->PDA patent - med rx.  . Depression     PTSD  . Diabetes mellitus    not on medications  . Diastolic CHF, chronic (HCC)    a. EF 55-60% by echo 2007  . GERD (gastroesophageal reflux disease)    "not anymore" " I had a lap band"  . History of diverticulitis of colon    5 YRS AGO  . History of pneumonia    2017  . History of transient ischemic attack (TIA)    CAROTID DOPPLERS NOV 2011  0-39& BIL. STENOSIS  . Hyperlipidemia   . Hypertension   . Kidney failure   . Mitral regurgitation   . Myocardial infarction ([place])   . Neuromuscular disorder ([place])    left arm numbness   . 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 PTSD (post-traumatic stress disorder)    from [place] since 1973  . 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 Shortness of breath dyspnea    with exertion  . Skin cancer   . Sleep apnea    cpap- see ov note in [facility] 05/12/11 for settings   . Stroke ([place])    hx of  Tobacco History: Social History   Tobacco Use  Smoking Status Never Smoker  Smokeless Tobacco Never Used   Counseling given: Yes   Outpatient Encounter Medications as of 01/29/2021  Medication Sig  . Alirocumab (PRALUENT) 75 MG/ML SOAJ Inject 75 mg into the skin every 14 (fourteen) days.  Marland Kitchen allopurinol (ZYLOPRIM) 100 MG tablet Take 0.5 tablets (50 mg total) by mouth 2 (two) times a week. Please discuss dosing with your PCP based on your renal function.  Marland Kitchen alprostadil (EDEX) 10 MCG injection 10 mcg by Intracavitary route daily as needed for erectile dysfunction.  Marland Kitchen amiodarone (PACERONE) 200 MG tablet Take 1 tablet (200 mg total) by mouth daily. (Patient taking differently: Take 200 mg by mouth 2 (two) times daily.)  . apixaban (ELIQUIS) 5 MG TABS tablet Take 1 tablet (5 mg total) by mouth 2 (two) times  daily.  . Artificial Saliva (MOUTH KOTE MT) Use as directed 4 sprays in the mouth or throat every 3 (three) hours as needed (for dry mouth).  . Ascorbic Acid (VITAMIN C WITH ROSE HIPS) 1000 MG tablet Take 1,000 mg by mouth daily.  . calcitRIOL (ROCALTROL) 0.25 MCG capsule Take 0.25 mcg by mouth every other day.  . Carboxymethylcellulose Sodium 1 % GEL Place 1 drop into both eyes daily as needed (dry eyes).  . cetirizine (ZYRTEC) 10 MG tablet Take 10 mg by mouth daily.  . colchicine 0.6 MG tablet Take 0.6-1.2 mg by mouth 2 (two) times daily as needed (gout).   . fluticasone (FLONASE) 50 MCG/ACT nasal spray Place 1 spray into both nostrils daily as needed for allergies or rhinitis.  Marland Kitchen HYDROcodone-acetaminophen (NORCO/VICODIN) 5-325 MG tablet Take 1 tablet by mouth daily as needed for severe pain.  . nitroGLYCERIN (NITROSTAT) 0.4 MG SL tablet 1 TAB UNDER THE TONGUE EVERY 5 MINUTES AS NEEDED FOR CHEST PAIN UP TO 3 DOSES, IF PERSIST CALL 911 (Patient taking differently: Place 0.4 mg under the tongue every 5 (five) minutes as needed for chest pain.)  . pantoprazole (PROTONIX) 40 MG tablet Take 40 mg by mouth daily as needed (heartburn).  . Semaglutide,0.25 or 0.5MG /DOS, (OZEMPIC, 0.25 OR 0.5 MG/DOSE,) 2 MG/1.5ML SOPN Inject 0.375 mLs (0.5 mg total) into the skin every Thursday.  . sildenafil (VIAGRA) 100 MG tablet Take 1 tablet by mouth daily as needed for erectile dysfunction.  . tadalafil (CIALIS) 20 MG tablet TAKE 1 TABLET BY MOUTH ONCE DAILY AS NEEDED (Patient taking differently: Take 20 mg by mouth daily as needed for erectile dysfunction.)  . testosterone cypionate (DEPOTESTOSTERONE CYPIONATE) 200 MG/ML injection Inject 200 mg into the muscle every 14 (fourteen) days.  . traZODone (DESYREL) 100 MG tablet Take 100 mg by mouth at bedtime.   No facility-administered encounter medications on file as of 01/29/2021.     Review of Systems  Review of Systems  Constitutional: Positive for fatigue.   HENT: Negative.   Respiratory: Positive for shortness of breath. Negative for cough.   Cardiovascular: Negative.  Negative for chest pain, palpitations and leg swelling.  Gastrointestinal: Negative.   Musculoskeletal: Positive for gait problem.  Allergic/Immunologic: Negative.   Psychiatric/Behavioral: Positive for confusion, decreased concentration and sleep disturbance.       Physical Exam  BP 123/62   Pulse (!) 56   Temp 97.9 F (36.6 C)   Resp 18   SpO2 100%   Wt Readings from Last 5 Encounters:  01/10/21 181 lb 9.6 oz (82.4 kg)  12/04/20 187 lb 6.4 oz (85 kg)  11/06/20 191 lb 9.6 oz (86.9 kg)  10/18/20 192 lb 9.6 oz (87.4 kg)  10/10/20 194 lb 12.8 oz (88.4 kg)     Physical Exam Vitals and nursing note reviewed.  Constitutional:      General: He is not in acute distress.    Appearance: He is well-developed.  Cardiovascular:     Rate and Rhythm: Normal rate and regular rhythm.  Pulmonary:     Effort: Pulmonary effort is normal.     Breath sounds: Normal breath sounds.  Musculoskeletal:     Right lower leg: No edema.     Left lower leg: No edema.  Skin:    General: Skin is warm and dry.  Neurological:     Mental Status: He is alert and oriented to person, place, and time.  Psychiatric:        Mood and Affect: Mood normal.        Behavior: Behavior normal.      Imaging: CT Head Wo Contrast  Result Date: 01/06/2021 CLINICAL DATA:  Increasing falls, initial encounter EXAM: CT HEAD WITHOUT CONTRAST TECHNIQUE: Contiguous axial images were obtained from the base of the skull through the vertex without intravenous contrast. COMPARISON:  10/04/2020 FINDINGS: Brain: No evidence of acute infarction, hemorrhage, hydrocephalus, extra-axial collection or mass lesion/mass effect. Mild atrophic and ischemic changes are noted. Vascular: No hyperdense vessel or unexpected calcification. Skull: Normal. Negative for fracture or focal lesion. Sinuses/Orbits: No acute finding.  Other: None. IMPRESSION: Mild atrophic and ischemic changes stable from the prior exam. No acute abnormality noted. Electronically Signed   By: Inez Catalina M.D.   On: 01/06/2021 16:04   US RENAL  Result Date: 01/07/2021 CLINICAL DATA:  Acute kidney injury EXAM: RENAL / URINARY TRACT ULTRASOUND COMPLETE COMPARISON:  CT 04/11/2020 FINDINGS: Right Kidney: Renal measurements: 12.4 x 5.6 x 6.6 cm = volume: 238 mL. Echogenicity within normal limits. Mild hydronephrosis, new since previous. Shadowing calculi largest 1.2 cm in the lower pole collecting system. Left Kidney: Renal measurements: 9.7 x 5.5 x 5.2 cm = volume: 145 mL. Echogenicity within normal limits. No mass or hydronephrosis visualized. Bladder: Appears normal for degree of bladder distention. Other: None. IMPRESSION: 1. Right hydronephrosis and nephrolithiasis. 2. Left kidney and bladder unremarkable. Electronically Signed   By: Lucrezia Europe M.D.   On: 01/07/2021 16:13   DG Chest Portable 1 View  Result Date: 01/06/2021 CLINICAL DATA:  COVID positive with cough EXAM: PORTABLE CHEST 1 VIEW COMPARISON:  12/31/2020 FINDINGS: Post sternotomy changes. No focal opacity or pleural effusion. Stable cardiomegaly. No pneumothorax IMPRESSION: No active disease. Electronically Signed   By: Donavan Foil M.D.   On: 01/06/2021 15:28   DG C-Arm 1-60 Min-No Report  Result Date: 01/08/2021 Fluoroscopy was utilized by the requesting physician.  No radiographic interpretation.   CT RENAL STONE STUDY  Result Date: 01/07/2021 CLINICAL DATA:  Acute renal failure EXAM: CT ABDOMEN AND PELVIS WITHOUT CONTRAST TECHNIQUE: Multidetector CT imaging of the abdomen and pelvis was performed following the standard protocol without IV contrast. COMPARISON:  Ultrasound from earlier in the same day, CT from 04/11/2020. FINDINGS: Lower chest: Few calcified granulomas are noted in the right lung base. No focal infiltrate or sizable effusion is seen. Hepatobiliary: Gallbladder  demonstrates multiple gallstones without complicating factors. The liver appears within normal limits. Pancreas: Unremarkable. No pancreatic ductal dilatation or surrounding inflammatory changes. Spleen: Normal in size without focal abnormality. Adrenals/Urinary Tract: Adrenal glands are within normal limits. Left kidney demonstrates a tiny  nonobstructing stone. The ureter is within normal limits. The right kidney demonstrates increased perinephric stranding as well as hydronephrosis. This is secondary to a mid right ureteral stone best seen on image number 52 of series 2. This measures approximately 8-9 mm in greatest dimension. The more distal right ureter is within normal limits. Nonobstructing 8 mm stone is noted within the right kidney as well as a smaller upper pole stone. Bladder is decompressed. Stomach/Bowel: Scattered diverticular change of the colon is noted without evidence of diverticulitis. The appendix is within normal limits. Small bowel and stomach are unremarkable. Gastric lap band is noted in satisfactory position. Vascular/Lymphatic: Aortic atherosclerosis. No enlarged abdominal or pelvic lymph nodes. Reproductive: Prostate is unremarkable. Other: No abdominal wall hernia or abnormality. No abdominopelvic ascites. Musculoskeletal: Degenerative changes of lumbar spine are noted. IMPRESSION: Nonobstructing bilateral renal calculi. 8-9 mm stone in the mid right ureter causing obstructive change. This corresponds with that seen on recent ultrasound. Stable gastric lap band. Diverticulosis without diverticulitis. Electronically Signed   By: Inez Catalina M.D.   On: 01/07/2021 20:28   US Abdomen Limited RUQ (LIVER/GB)  Result Date: 01/10/2021 CLINICAL DATA:  Elevated liver enzymes EXAM: ULTRASOUND ABDOMEN LIMITED RIGHT UPPER QUADRANT COMPARISON:  CT abdomen and pelvis Jan 07, 2021 FINDINGS: Gallbladder: Small layering gallstones are noted, better delineated on recent CT. Largest gallstone measures  3-4 mm. No gallbladder wall thickening or pericholecystic fluid. No sonographic Murphy sign noted by sonographer. Common bile duct: Diameter: 5 mm. No intrahepatic or extrahepatic biliary duct dilatation. Liver: No focal lesion identified. Within normal limits in parenchymal echogenicity. Portal vein is patent on color Doppler imaging with normal direction of blood flow towards the liver. Other: None. IMPRESSION: Gallstones seen to layer posteriorly, better delineated on recent CT examination. No gallbladder wall thickening or pericholecystic fluid appreciable. Study otherwise unremarkable. Electronically Signed   By: Lowella Grip III M.D.   On: 01/10/2021 09:10     Assessment & Plan:   History of COVID-19 Fatigue Gait disturbance Memory loss Loss of appetite Altered taste and smell:   Stay well hydrated  Stay active  Deep breathing exercises  May take tylenolf or fever or pain  Will order PT  Will order Speech  Will place referral to neurology - Dr. Brett Fairy - altered taste and smell, altered gait, memory loss  Please eat 6 small meals / large snacks per day that are high protein low carb  Follow up:  Follow up in 3 months or sooner if needed      Fenton Foy, NP 01/29/2021

## 2021-01-29 NOTE — Assessment & Plan Note (Signed)
Fatigue Gait disturbance Memory loss Loss of appetite Altered taste and smell:   Stay well hydrated  Stay active  Deep breathing exercises  May take tylenolf or fever or pain  Will order PT  Will order Speech  Will place referral to neurology - Dr. Brett Fairy - altered taste and smell, altered gait, memory loss  Please eat 6 small meals / large snacks per day that are high protein low carb  Follow up:  Follow up in 3 months or sooner if needed

## 2021-02-01 ENCOUNTER — Inpatient Hospital Stay (HOSPITAL_COMMUNITY)
Admission: EM | Admit: 2021-02-01 | Discharge: 2021-02-04 | DRG: 064 | Disposition: A | Discharge status: Home / Self Care | Payer: No Typology Code available for payment source | Attending: Internal Medicine | Admitting: Internal Medicine

## 2021-02-01 ENCOUNTER — Inpatient Hospital Stay (HOSPITAL_COMMUNITY): Payer: No Typology Code available for payment source

## 2021-02-01 ENCOUNTER — Emergency Department (HOSPITAL_COMMUNITY): Payer: No Typology Code available for payment source

## 2021-02-01 ENCOUNTER — Encounter (HOSPITAL_COMMUNITY): Payer: Self-pay | Admitting: Emergency Medicine

## 2021-02-01 DIAGNOSIS — Z85828 Personal history of other malignant neoplasm of skin: Secondary | ICD-10-CM

## 2021-02-01 DIAGNOSIS — I130 Hypertensive heart and chronic kidney disease with heart failure and stage 1 through stage 4 chronic kidney disease, or unspecified chronic kidney disease: Secondary | ICD-10-CM | POA: Diagnosis present

## 2021-02-01 DIAGNOSIS — Z961 Presence of intraocular lens: Secondary | ICD-10-CM | POA: Diagnosis present

## 2021-02-01 DIAGNOSIS — Z820 Family history of epilepsy and other diseases of the nervous system: Secondary | ICD-10-CM

## 2021-02-01 DIAGNOSIS — U099 Post covid-19 condition, unspecified: Secondary | ICD-10-CM | POA: Diagnosis present

## 2021-02-01 DIAGNOSIS — R531 Weakness: Secondary | ICD-10-CM

## 2021-02-01 DIAGNOSIS — G8191 Hemiplegia, unspecified affecting right dominant side: Secondary | ICD-10-CM | POA: Diagnosis present

## 2021-02-01 DIAGNOSIS — Z823 Family history of stroke: Secondary | ICD-10-CM

## 2021-02-01 DIAGNOSIS — I444 Left anterior fascicular block: Secondary | ICD-10-CM | POA: Diagnosis present

## 2021-02-01 DIAGNOSIS — Z20822 Contact with and (suspected) exposure to covid-19: Secondary | ICD-10-CM | POA: Diagnosis present

## 2021-02-01 DIAGNOSIS — F32A Depression, unspecified: Secondary | ICD-10-CM | POA: Diagnosis present

## 2021-02-01 DIAGNOSIS — Z885 Allergy status to narcotic agent status: Secondary | ICD-10-CM

## 2021-02-01 DIAGNOSIS — Z6824 Body mass index (BMI) 24.0-24.9, adult: Secondary | ICD-10-CM

## 2021-02-01 DIAGNOSIS — I6381 Other cerebral infarction due to occlusion or stenosis of small artery: Secondary | ICD-10-CM

## 2021-02-01 DIAGNOSIS — Z818 Family history of other mental and behavioral disorders: Secondary | ICD-10-CM

## 2021-02-01 DIAGNOSIS — I6339 Cerebral infarction due to thrombosis of other cerebral artery: Principal | ICD-10-CM | POA: Diagnosis present

## 2021-02-01 DIAGNOSIS — E43 Unspecified severe protein-calorie malnutrition: Secondary | ICD-10-CM | POA: Insufficient documentation

## 2021-02-01 DIAGNOSIS — K219 Gastro-esophageal reflux disease without esophagitis: Secondary | ICD-10-CM | POA: Diagnosis present

## 2021-02-01 DIAGNOSIS — M21371 Foot drop, right foot: Secondary | ICD-10-CM | POA: Diagnosis present

## 2021-02-01 DIAGNOSIS — R079 Chest pain, unspecified: Secondary | ICD-10-CM

## 2021-02-01 DIAGNOSIS — Z833 Family history of diabetes mellitus: Secondary | ICD-10-CM

## 2021-02-01 DIAGNOSIS — E1122 Type 2 diabetes mellitus with diabetic chronic kidney disease: Secondary | ICD-10-CM | POA: Diagnosis present

## 2021-02-01 DIAGNOSIS — I6330 Cerebral infarction due to thrombosis of unspecified cerebral artery: Secondary | ICD-10-CM | POA: Diagnosis not present

## 2021-02-01 DIAGNOSIS — Z888 Allergy status to other drugs, medicaments and biological substances status: Secondary | ICD-10-CM

## 2021-02-01 DIAGNOSIS — Z955 Presence of coronary angioplasty implant and graft: Secondary | ICD-10-CM

## 2021-02-01 DIAGNOSIS — I252 Old myocardial infarction: Secondary | ICD-10-CM | POA: Diagnosis not present

## 2021-02-01 DIAGNOSIS — Z9841 Cataract extraction status, right eye: Secondary | ICD-10-CM

## 2021-02-01 DIAGNOSIS — I671 Cerebral aneurysm, nonruptured: Secondary | ICD-10-CM | POA: Diagnosis present

## 2021-02-01 DIAGNOSIS — Z7901 Long term (current) use of anticoagulants: Secondary | ICD-10-CM

## 2021-02-01 DIAGNOSIS — I080 Rheumatic disorders of both mitral and aortic valves: Secondary | ICD-10-CM | POA: Diagnosis present

## 2021-02-01 DIAGNOSIS — G4733 Obstructive sleep apnea (adult) (pediatric): Secondary | ICD-10-CM | POA: Diagnosis present

## 2021-02-01 DIAGNOSIS — E785 Hyperlipidemia, unspecified: Secondary | ICD-10-CM | POA: Diagnosis present

## 2021-02-01 DIAGNOSIS — K5900 Constipation, unspecified: Secondary | ICD-10-CM | POA: Diagnosis present

## 2021-02-01 DIAGNOSIS — F4310 Post-traumatic stress disorder, unspecified: Secondary | ICD-10-CM | POA: Diagnosis present

## 2021-02-01 DIAGNOSIS — D539 Nutritional anemia, unspecified: Secondary | ICD-10-CM | POA: Diagnosis present

## 2021-02-01 DIAGNOSIS — R200 Anesthesia of skin: Secondary | ICD-10-CM | POA: Diagnosis present

## 2021-02-01 DIAGNOSIS — I2510 Atherosclerotic heart disease of native coronary artery without angina pectoris: Secondary | ICD-10-CM | POA: Diagnosis present

## 2021-02-01 DIAGNOSIS — I5032 Chronic diastolic (congestive) heart failure: Secondary | ICD-10-CM | POA: Diagnosis present

## 2021-02-01 DIAGNOSIS — R29700 NIHSS score 0: Secondary | ICD-10-CM | POA: Diagnosis present

## 2021-02-01 DIAGNOSIS — N184 Chronic kidney disease, stage 4 (severe): Secondary | ICD-10-CM | POA: Diagnosis present

## 2021-02-01 DIAGNOSIS — Z8249 Family history of ischemic heart disease and other diseases of the circulatory system: Secondary | ICD-10-CM

## 2021-02-01 DIAGNOSIS — Z79899 Other long term (current) drug therapy: Secondary | ICD-10-CM

## 2021-02-01 DIAGNOSIS — I482 Chronic atrial fibrillation, unspecified: Secondary | ICD-10-CM | POA: Diagnosis present

## 2021-02-01 DIAGNOSIS — Z952 Presence of prosthetic heart valve: Secondary | ICD-10-CM

## 2021-02-01 DIAGNOSIS — D509 Iron deficiency anemia, unspecified: Secondary | ICD-10-CM | POA: Diagnosis present

## 2021-02-01 DIAGNOSIS — Z951 Presence of aortocoronary bypass graft: Secondary | ICD-10-CM | POA: Diagnosis not present

## 2021-02-01 DIAGNOSIS — Z9842 Cataract extraction status, left eye: Secondary | ICD-10-CM

## 2021-02-01 LAB — CBC
HCT: 35.3 % — ABNORMAL LOW (ref 39.0–52.0)
Hemoglobin: 11.6 g/dL — ABNORMAL LOW (ref 13.0–17.0)
MCH: 33.8 pg (ref 26.0–34.0)
MCHC: 32.9 g/dL (ref 30.0–36.0)
MCV: 102.9 fL — ABNORMAL HIGH (ref 80.0–100.0)
Platelets: 168 10*3/uL (ref 150–400)
RBC: 3.43 MIL/uL — ABNORMAL LOW (ref 4.22–5.81)
RDW: 13.9 % (ref 11.5–15.5)
WBC: 7.8 10*3/uL (ref 4.0–10.5)
nRBC: 0 % (ref 0.0–0.2)

## 2021-02-01 LAB — URINALYSIS, ROUTINE W REFLEX MICROSCOPIC
Bilirubin Urine: NEGATIVE
Glucose, UA: NEGATIVE mg/dL
Hgb urine dipstick: NEGATIVE
Ketones, ur: NEGATIVE mg/dL
Leukocytes,Ua: NEGATIVE
Nitrite: NEGATIVE
Protein, ur: NEGATIVE mg/dL
Specific Gravity, Urine: 1.005 (ref 1.005–1.030)
pH: 6 (ref 5.0–8.0)

## 2021-02-01 LAB — DIFFERENTIAL
Abs Immature Granulocytes: 0.04 10*3/uL (ref 0.00–0.07)
Basophils Absolute: 0.1 10*3/uL (ref 0.0–0.1)
Basophils Relative: 1 %
Eosinophils Absolute: 0.5 10*3/uL (ref 0.0–0.5)
Eosinophils Relative: 7 %
Immature Granulocytes: 1 %
Lymphocytes Relative: 12 %
Lymphs Abs: 0.9 10*3/uL (ref 0.7–4.0)
Monocytes Absolute: 0.6 10*3/uL (ref 0.1–1.0)
Monocytes Relative: 7 %
Neutro Abs: 5.7 10*3/uL (ref 1.7–7.7)
Neutrophils Relative %: 72 %

## 2021-02-01 LAB — COMPREHENSIVE METABOLIC PANEL
ALT: 35 U/L (ref 0–44)
AST: 28 U/L (ref 15–41)
Albumin: 3.3 g/dL — ABNORMAL LOW (ref 3.5–5.0)
Alkaline Phosphatase: 116 U/L (ref 38–126)
Anion gap: 9 (ref 5–15)
BUN: 29 mg/dL — ABNORMAL HIGH (ref 8–23)
CO2: 25 mmol/L (ref 22–32)
Calcium: 9.3 mg/dL (ref 8.9–10.3)
Chloride: 101 mmol/L (ref 98–111)
Creatinine, Ser: 3.1 mg/dL — ABNORMAL HIGH (ref 0.61–1.24)
GFR, Estimated: 20 mL/min — ABNORMAL LOW (ref >60)
Glucose, Bld: 153 mg/dL — ABNORMAL HIGH (ref 70–99)
Potassium: 3.9 mmol/L (ref 3.5–5.1)
Sodium: 135 mmol/L (ref 135–145)
Total Bilirubin: 0.8 mg/dL (ref 0.3–1.2)
Total Protein: 5.6 g/dL — ABNORMAL LOW (ref 6.5–8.1)

## 2021-02-01 LAB — TROPONIN I (HIGH SENSITIVITY)
Troponin I (High Sensitivity): 16 ng/L (ref <18)
Troponin I (High Sensitivity): 12 ng/L (ref <18)

## 2021-02-01 LAB — I-STAT CHEM 8, ED
BUN: 32 mg/dL — ABNORMAL HIGH (ref 8–23)
Calcium, Ion: 1.23 mmol/L (ref 1.15–1.40)
Chloride: 101 mmol/L (ref 98–111)
Creatinine, Ser: 3.2 mg/dL — ABNORMAL HIGH (ref 0.61–1.24)
Glucose, Bld: 151 mg/dL — ABNORMAL HIGH (ref 70–99)
HCT: 37 % — ABNORMAL LOW (ref 39.0–52.0)
Hemoglobin: 12.6 g/dL — ABNORMAL LOW (ref 13.0–17.0)
Potassium: 3.8 mmol/L (ref 3.5–5.1)
Sodium: 135 mmol/L (ref 135–145)
TCO2: 24 mmol/L (ref 22–32)

## 2021-02-01 LAB — APTT: aPTT: 32 seconds (ref 24–36)

## 2021-02-01 LAB — CBG MONITORING, ED
Glucose-Capillary: 148 mg/dL — ABNORMAL HIGH (ref 70–99)
Glucose-Capillary: 152 mg/dL — ABNORMAL HIGH (ref 70–99)

## 2021-02-01 LAB — PROTIME-INR
INR: 1.6 — ABNORMAL HIGH (ref 0.8–1.2)
Prothrombin Time: 19.1 seconds — ABNORMAL HIGH (ref 11.4–15.2)

## 2021-02-01 MED ORDER — INSULIN ASPART 100 UNIT/ML IJ SOLN
0.0000 [IU] | Freq: Four times a day (QID) | INTRAMUSCULAR | Status: DC
  Administered 2021-02-02 – 2021-02-03 (×6): 1 [IU] via SUBCUTANEOUS
  Administered 2021-02-03 – 2021-02-04 (×3): 2 [IU] via SUBCUTANEOUS

## 2021-02-01 MED ORDER — SODIUM CHLORIDE 0.9 % IV SOLN: INTRAVENOUS | Status: DC

## 2021-02-01 MED ORDER — CEPHALEXIN 250 MG PO CAPS: 500.0000 mg | ORAL_CAPSULE | Freq: Once | ORAL | Status: DC

## 2021-02-01 MED ORDER — SODIUM CHLORIDE 0.9% FLUSH
3.0000 mL | Freq: Once | INTRAVENOUS | Status: AC
Start: 2021-02-01 — End: 2021-02-01
  Administered 2021-02-01: 3 mL via INTRAVENOUS

## 2021-02-01 NOTE — ED Notes (Signed)
Patient transported to MRI 

## 2021-02-01 NOTE — H&P (Signed)
TRH H&P    Patient Demographics:    [name], is a 75 y.o. male  MRN: [id number]  DOB - [birth year]  Admit Date - 02/01/2021  Referring MD/NP/PA: Davonna Belling  Outpatient Primary MD for the patient is Jason Cruel, MD  Patient coming from: Home  Chief complaint-dizziness, right-sided weakness   HPI:    [name]  is a 75 y.o. male, with medical history of atrial fibrillation on chronic anticoagulation with Eliquis, diabetes mellitus type 2, chronic diastolic CHF, CKD stage IV, hyperlipidemia, hypertension, CAD s/p CABG, who was diagnosed with COVID 19 infection a month ago, at that time he did not require hospitalization.  Incidentally patient also had COVID-19 infection last year.  Patient states that since her recent COVID infection he has had ongoing loss of taste and smell, decreased appetite, 20 pound weight loss, brain fog, memory loss, sleep disturbance.  For past 3 days he has developed right-sided leg weakness and also mild arm weakness.  Patient has been dragging his right foot while walking.  No slurred speech.  He also had chest pain 2 days ago, but denies chest pain at this time.  Denies shortness of breath.  Denies nausea or vomiting.  Has been having diarrhea for past 2 days.  Reportedly patient started taking Colace a week ago for constipation.  He also has history of gout, he took colchicine for gout flareup in left heel and that has improved.  Patient's allopurinol was stopped 3 weeks ago when he was discharged from this long hospital. He was hospitalized at [facility] long hospital 3 weeks ago at that time his Lasix was stopped.  As patient started developing leg edema his wife who is a nurse started him back on Lasix 40 mg daily.  In the ED, CT head was negative, creatinine was 3.20, troponin 12, chest x-ray showed no acute disease.    Review of systems:    In addition to the HPI  above,    All other systems reviewed and are negative.    Past History of the following :    Past Medical History:  Diagnosis Date   Allergic rhinitis    Anxiety    Atrial fibrillation ([place])    Cancer (HCC)    hx of skin cancers    Chronic kidney disease    Coronary artery disease    a. s/p MI & CABG; b. s/p PCI Diag;  c. Cath 12/2011: LAD diff dzs/small, Diag 75% isr, 90 dist to stent (small), LCX & RCA occluded, VG->OM 40, VG->PDA patent - med rx.   Depression    PTSD   Diabetes mellitus    not on medications   Diastolic CHF, chronic ([place])    a. EF 55-60% by echo 2007   GERD (gastroesophageal reflux disease)    "not anymore" " I had a lap band"   History of diverticulitis of colon    5 YRS AGO   History of pneumonia    2017   History of transient ischemic attack (TIA)  CAROTID DOPPLERS NOV 2011  0-39& BIL. 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   Complex valvuloplasty including artificial Gore-tex neochord placement x4, plication of anterior commissure, and 26 mm Sorin Memo 3D ring annuloplasty via right mini thoracotomy approach   Shortness of breath dyspnea    with exertion   Skin cancer    Sleep apnea    cpap- see ov note in EPIC 05/12/11 for settings    Stroke [facility])    hx of      Past Surgical History:  Procedure Laterality Date   AV FISTULA PLACEMENT Left 08/02/2018   Procedure: ARTERIOVENOUS (AV) FISTULA CREATION RADIOCEPHALIC;  Surgeon: Angelia Mould, MD;  Location: [place];  Service: Vascular;  Laterality: Left;   BLEPHAROPLASTY  [place]  2001, 2004, 2009, 2011   CARDIAC  CATHETERIZATION N/A 01/23/2016   Procedure: Right/Left Heart Cath and Coronary Angiography;  Surgeon: [name], MD;  Location: [place] CV LAB;  Service: Cardiovascular;  Laterality: N/A;   CARDIOVERSION N/A 09/07/2017   Procedure: CARDIOVERSION;  Surgeon: [name], MD;  Location: [facility] ENDOSCOPY;  Service: Cardiovascular;  Laterality: N/A;   CARDIOVERSION N/A 09/13/2019   Procedure: CARDIOVERSION;  Surgeon: [name], MD;  Location: [facility] ENDOSCOPY;  Service: Cardiovascular;  Laterality: N/A;   CARDIOVERSION N/A 10/19/2019   Procedure: CARDIOVERSION;  Surgeon: [name], MD;  Location: [facility] ENDOSCOPY;  Service: Cardiovascular;  Laterality: N/A;   CATARACT EXTRACTION W/ INTRAOCULAR LENS IMPLANT Bilateral    CHONDROPLASTY  07/22/2011   Procedure: CHONDROPLASTY;  Surgeon: Dione Plover Aluisio;  Location: [place];  Service: Orthopedics;;   CIRCUMCISION  [place]-- POST CABG   W/ STENT, last cath 01/07/2012    CORONARY ARTERY BYPASS GRAFT  1995   X3 VESSEL   CORONARY ARTERY BYPASS GRAFT  1995   CORONARY STENT PLACEMENT     CYSTOSCOPY/URETEROSCOPY/HOLMIUM LASER/STENT PLACEMENT Right 01/08/2021   Procedure: CYSTOSCOPY/RETROGRADE/URETEROSCOPY/HOLMIUM LASER/STENT PLACEMENT;  Surgeon: Franchot Gallo, MD;  Location: [facility];  Service: Urology;  Laterality: Right;   KNEE ARTHROSCOPY  07/22/2011   Procedure: ARTHROSCOPY KNEE;  Surgeon: [name];  Location: [place];  Service: Orthopedics;  Laterality: Right;  WITH DEBRIDEMENT    KNEE ARTHROSCOPY W/ MENISCECTOMY  X2 IN 2002-- RIGHT KNEE   LAPAROSCOPIC GASTRIC BANDING  01-15-09   LEFT HEART CATH AND CORONARY ANGIOGRAPHY N/A 04/29/2017   Procedure: LEFT HEART CATH AND CORONARY ANGIOGRAPHY;  Surgeon: [name], MD;  Location: [place] CV LAB;  Service: Cardiovascular;  Laterality: N/A;   LEFT HEART CATHETERIZATION WITH CORONARY ANGIOGRAM N/A  09/11/2013   Procedure: LEFT HEART CATHETERIZATION WITH CORONARY ANGIOGRAM;  Surgeon: [name], MD;  Location: [facility] CATH LAB;  Service: Cardiovascular;  Laterality: N/A;   MITRAL VALVE REPAIR Right 03/25/2016   Procedure: MINIMALLY INVASIVE REOPERATION FOR MITRAL VALVE REPAIR  (MVR) with size 26 Sorin Memo 3D;  Surgeon: [name], MD;  Location: [place];  Service:  Open Heart Surgery;  Laterality: Right;   MITRAL VALVE REPAIR  03/2016   RIGHT KNEE ED COMPARTMENT REPLACEMENT  2004   SYNOVECTOMY  07/22/2011   Procedure: SYNOVECTOMY;  Surgeon: Dione Plover Aluisio;  Location: [place];  Service: Orthopedics;;   TEE WITHOUT CARDIOVERSION N/A 01/23/2016   Procedure: TRANSESOPHAGEAL ECHOCARDIOGRAM (TEE);  Surgeon: [name], MD;  Location: [place];  Service: Cardiovascular;  Laterality: N/A;   TEE WITHOUT CARDIOVERSION N/A 03/25/2016   Procedure: TRANSESOPHAGEAL ECHOCARDIOGRAM (TEE);  Surgeon: [name], MD;  Location: [place];  Service: Open Heart Surgery;  Laterality: N/A;   UMBILICAL HERNIA REPAIR  01-15-09   W/ GASTRIC BANDING PROCEDURE      Social History:      Social History   Tobacco Use   Smoking status: Never   Smokeless tobacco: Never  Substance Use Topics   Alcohol use: Not Currently    Alcohol/week: 2.0 standard drinks    Types: 1 Glasses of wine, 1 Shots of liquor per week    Comment: OCCASIONAL       Family History :     Family History  Problem Relation Age of Onset   Other Mother        joint problems   Alzheimer's disease Mother    Hypertension Father    Heart disease Father    CVA Father        age 9   Depression Father    Heart disease Sister        CABG   Stroke Sister    Heart failure Sister        congestive   Diabetes Sister    Heart disease Brother    Hypertension Brother    Congestive Heart Failure Brother       Home Medications:   Prior to Admission medications   Medication Sig Start Date End Date Taking?  Authorizing Provider  Alirocumab (PRALUENT) 75 MG/ML SOAJ Inject 75 mg into the skin every 14 (fourteen) days.   Yes [provider]  allopurinol (ZYLOPRIM) 100 MG tablet Take 0.5 tablets (50 mg total) by mouth 2 (two) times a week. Please discuss dosing with your PCP based on your renal function. 01/13/21 02/12/21 Yes [name], MD  amiodarone (PACERONE) 200 MG tablet Take 1 tablet (200 mg total) by mouth daily. 10/19/19  Yes [name], MD  apixaban (ELIQUIS) 5 MG TABS tablet Take 1 tablet (5 mg total) by mouth 2 (two) times daily. 09/09/17  Yes [name], MD  Ascorbic Acid (VITAMIN C WITH ROSE HIPS) 1000 MG tablet Take 1,000 mg by mouth daily.   Yes [provider]  calcitRIOL (ROCALTROL) 0.25 MCG capsule Take 0.25 mcg by mouth every other day.   Yes [provider]  cetirizine (ZYRTEC) 10 MG tablet Take 10 mg by mouth daily.   Yes [provider]  colchicine 0.6 MG tablet Take 0.6-1.2 mg by mouth 2 (two) times daily as needed (gout).  04/14/19  Yes [provider]  fluticasone (FLONASE) 50 MCG/ACT nasal spray Place 1 spray into both nostrils daily as needed for allergies or rhinitis.   Yes [provider]  HYDROcodone-acetaminophen (NORCO/VICODIN) 5-325 MG tablet Take 1 tablet by mouth daily as needed for severe pain.   Yes [provider]  nitroGLYCERIN (NITROSTAT) 0.4 MG SL tablet 1 TAB UNDER THE TONGUE EVERY 5 MINUTES AS NEEDED FOR CHEST PAIN UP TO 3 DOSES, IF PERSIST CALL 911  Patient taking differently: Place 0.4 mg under the tongue every 5 (five) minutes as needed for chest pain. 03/05/20  Yes [name], MD  pantoprazole (PROTONIX) 40 MG tablet Take 40 mg by mouth daily as needed (heartburn).   Yes [provider]  Semaglutide,0.25 or 0.5MG /DOS, (OZEMPIC, 0.25 OR 0.5 MG/DOSE,) 2 MG/1.5ML SOPN Inject 0.375 mLs (0.5 mg total) into the skin every Thursday. Patient taking differently: Inject 0.5 mg  into the skin once a week. 02/01/20  Yes Philemon Kingdom, MD  tadalafil (CIALIS) 20 MG tablet TAKE 1 TABLET BY MOUTH ONCE DAILY AS NEEDED Patient taking differently: Take 20 mg by mouth daily as needed for erectile dysfunction. 01/08/20  Yes McKenzie, Candee Furbish, MD  traZODone (DESYREL) 100 MG tablet Take 100 mg by mouth at bedtime.   Yes [provider]     Allergies:     Allergies  Allergen Reactions   Crestor [Rosuvastatin] Other (See Comments)    Muscle aches    Lipitor [Atorvastatin] Other (See Comments)    Muscle aches   Pravastatin Other (See Comments)    Muscle aches   Carvedilol     Other reaction(s): loss of appetite   Metformin Diarrhea   Serotonin Reuptake Inhibitors (Ssris)     Other reaction(s): Muscle aches   Zocor [Simvastatin]     Other reaction(s): Muscle pain   Codeine Itching and Other (See Comments)    Extremity tingling-- can take synthetic     Physical Exam:   Vitals  Blood pressure 120/66, pulse 61, temperature 97.8 F (36.6 C), temperature source Oral, resp. rate 19, height 5' 10.5" (1.791 m), weight 77.1 kg, SpO2 98 %.  1.  General: Appears in no acute distress  2. Psychiatric: Alert, oriented x3, intact insight and judgment  3. Neurologic: Cranial nerves II through XII grossly intact, motor strength 4 / 5 in right lower extremity, 5/5 in both left upper and lower extremity, very minimal weakness in right upper extremity, positive pronator drift on right side.  DTRs 2+ on left, DTRs absent in the right lower extremity  4. HEENMT:  Extraocular muscles are intact, atraumatic normocephalic  5. Respiratory : Clear to auscultation bilaterally, no wheezing or crackles auscultated  6. Cardiovascular : S1-S2, regular, no murmur auscultated  7. Gastrointestinal:  Abdomen is soft, nontender, no organomegaly  8. Skin:  No rashes noted     Data Review:    CBC Recent Labs  Lab 02/01/21 1211 02/01/21 1233  WBC 7.8  --   HGB  11.6* 12.6*  HCT 35.3* 37.0*  PLT 168  --   MCV 102.9*  --   MCH 33.8  --   MCHC 32.9  --   RDW 13.9  --   LYMPHSABS 0.9  --   MONOABS 0.6  --   EOSABS 0.5  --   BASOSABS 0.1  --    ------------------------------------------------------------------------------------------------------------------  Results for orders placed or performed during the hospital encounter of 02/01/21 (from the past 48 hour(s))  CBG monitoring, ED     Status: Abnormal   Collection Time: 02/01/21 12:07 PM  Result Value Ref Range   Glucose-Capillary 152 (H) 70 - 99 mg/dL    Comment: Glucose reference range applies only to samples taken after fasting for at least 8 hours.  Protime-INR     Status: Abnormal   Collection Time: 02/01/21 12:11 PM  Result Value Ref Range   Prothrombin Time 19.1 (H) 11.4 - 15.2 seconds   INR 1.6 (H)  0.8 - 1.2    Comment: (NOTE) INR goal varies based on device and disease states. Performed at [facility], [place] [address] [place], [place] [address]   APTT     Status: None   Collection Time: 02/01/21 12:11 PM  Result Value Ref Range   aPTT 32 24 - 36 seconds    Comment: Performed at [facility] [address] [place], [place] [address]  CBC     Status: Abnormal   Collection Time: 02/01/21 12:11 PM  Result Value Ref Range   WBC 7.8 4.0 - 10.5 K/uL   RBC 3.43 (L) 4.22 - 5.81 MIL/uL   Hemoglobin 11.6 (L) 13.0 - 17.0 g/dL   HCT 35.3 (L) 39.0 - 52.0 %   MCV 102.9 (H) 80.0 - 100.0 fL   MCH 33.8 26.0 - 34.0 pg   MCHC 32.9 30.0 - 36.0 g/dL   RDW 13.9 11.5 - 15.5 %   Platelets 168 150 - 400 K/uL   nRBC 0.0 0.0 - 0.2 %    Comment: Performed at [facility], [place] [address], [place], [place] [address]  Differential     Status: None   Collection Time: 02/01/21 12:11 PM  Result Value Ref Range   Neutrophils Relative % 72 %   Neutro Abs 5.7 1.7 - 7.7 K/uL   Lymphocytes Relative 12 %   Lymphs Abs 0.9 0.7 - 4.0 K/uL   Monocytes Relative 7 %   Monocytes  Absolute 0.6 0.1 - 1.0 K/uL   Eosinophils Relative 7 %   Eosinophils Absolute 0.5 0.0 - 0.5 K/uL   Basophils Relative 1 %   Basophils Absolute 0.1 0.0 - 0.1 K/uL   Immature Granulocytes 1 %   Abs Immature Granulocytes 0.04 0.00 - 0.07 K/uL    Comment: Performed at [facility] [address], [place], [place] [address]  Comprehensive metabolic panel     Status: Abnormal   Collection Time: 02/01/21 12:11 PM  Result Value Ref Range   Sodium 135 135 - 145 mmol/L   Potassium 3.9 3.5 - 5.1 mmol/L   Chloride 101 98 - 111 mmol/L   CO2 25 22 - 32 mmol/L   Glucose, Bld 153 (H) 70 - 99 mg/dL    Comment: Glucose reference range applies only to samples taken after fasting for at least 8 hours.   BUN 29 (H) 8 - 23 mg/dL   Creatinine, Ser 3.10 (H) 0.61 - 1.24 mg/dL   Calcium 9.3 8.9 - 10.3 mg/dL   Total Protein 5.6 (L) 6.5 - 8.1 g/dL   Albumin 3.3 (L) 3.5 - 5.0 g/dL   AST 28 15 - 41 U/L   ALT 35 0 - 44 U/L   Alkaline Phosphatase 116 38 - 126 U/L   Total Bilirubin 0.8 0.3 - 1.2 mg/dL   GFR, Estimated 20 (L) >60 mL/min    Comment: (NOTE) Calculated using the CKD-EPI Creatinine Equation (2021)    Anion gap 9 5 - 15    Comment: Performed at [facility], [place] [address], [place], [place] [address]  Troponin I (High Sensitivity)     Status: None   Collection Time: 02/01/21 12:11 PM  Result Value Ref Range   Troponin I (High Sensitivity) 16 <18 ng/L    Comment: (NOTE) Elevated high sensitivity troponin I (hsTnI) values and significant  changes across serial measurements may suggest ACS but many other  chronic and acute conditions are known  to elevate hsTnI results.  Refer to the "Links" section for chest pain algorithms and additional  guidance. Performed at [facility], [place] [address] [place] [address] [place], [place] [address]   I-stat chem 8, ED     Status: Abnormal   Collection Time: 02/01/21 12:33 PM  Result Value Ref Range   Sodium 135 135 - 145 mmol/L   Potassium 3.8 3.5 - 5.1  mmol/L   Chloride 101 98 - 111 mmol/L   BUN 32 (H) 8 - 23 mg/dL   Creatinine, Ser 3.20 (H) 0.61 - 1.24 mg/dL   Glucose, Bld 151 (H) 70 - 99 mg/dL    Comment: Glucose reference range applies only to samples taken after fasting for at least 8 hours.   Calcium, Ion 1.23 1.15 - 1.40 mmol/L   TCO2 24 22 - 32 mmol/L   Hemoglobin 12.6 (L) 13.0 - 17.0 g/dL   HCT 37.0 (L) 39.0 - 52.0 %  Troponin I (High Sensitivity)     Status: None   Collection Time: 02/01/21  2:11 PM  Result Value Ref Range   Troponin I (High Sensitivity) 12 <18 ng/L    Comment: (NOTE) Elevated high sensitivity troponin I (hsTnI) values and significant  changes across serial measurements may suggest ACS but many other  chronic and acute conditions are known to elevate hsTnI results.  Refer to the "Links" section for chest pain algorithms and additional  guidance. Performed at [facility], [place] [address], [place], [place] [address]     Chemistries  Recent Labs  Lab 02/01/21 1211 02/01/21 1233  NA 135 135  K 3.9 3.8  CL 101 101  CO2 25  --   GLUCOSE 153* 151*  BUN 29* 32*  CREATININE 3.10* 3.20*  CALCIUM 9.3  --   AST 28  --   ALT 35  --   ALKPHOS 116  --   BILITOT 0.8  --    ------------------------------------------------------------------------------------------------------------------  ------------------------------------------------------------------------------------------------------------------ GFR: Estimated Creatinine Clearance: 20.9 mL/min (A) (by C-G formula based on SCr of 3.2 mg/dL (H)). Liver Function Tests: Recent Labs  Lab 02/01/21 1211  AST 28  ALT 35  ALKPHOS 116  BILITOT 0.8  PROT 5.6*  ALBUMIN 3.3*   No results for input(s): LIPASE, AMYLASE in the last 168 hours. No results for input(s): AMMONIA in the last 168 hours. Coagulation Profile: Recent Labs  Lab 02/01/21 1211  INR 1.6*   Cardiac Enzymes: No results for input(s): CKTOTAL, CKMB, CKMBINDEX, TROPONINI in the  last 168 hours. BNP (last 3 results) No results for input(s): PROBNP in the last 8760 hours. HbA1C: No results for input(s): HGBA1C in the last 72 hours. CBG: Recent Labs  Lab 02/01/21 1207  GLUCAP 152*   Lipid Profile: No results for input(s): CHOL, HDL, LDLCALC, TRIG, CHOLHDL, LDLDIRECT in the last 72 hours. Thyroid Function Tests: No results for input(s): TSH, T4TOTAL, FREET4, T3FREE, THYROIDAB in the last 72 hours. Anemia Panel: No results for input(s): VITAMINB12, FOLATE, FERRITIN, TIBC, IRON, RETICCTPCT in the last 72 hours.  --------------------------------------------------------------------------------------------------------------- Urine analysis:    Component Value Date/Time   COLORURINE YELLOW 01/06/2021 1456   APPEARANCEUR HAZY (A) 01/06/2021 1456   LABSPEC 1.010 01/06/2021 1456   PHURINE 6.0 01/06/2021 1456   GLUCOSEU NEGATIVE 01/06/2021 1456   HGBUR LARGE (A) 01/06/2021 1456   BILIRUBINUR NEGATIVE 01/06/2021 1456   KETONESUR NEGATIVE 01/06/2021 1456   PROTEINUR NEGATIVE 01/06/2021 1456   UROBILINOGEN 0.2 04/04/2015 1529   NITRITE NEGATIVE 01/06/2021 1456  LEUKOCYTESUR NEGATIVE 01/06/2021 1456      Imaging Results:    CT HEAD WO CONTRAST  Result Date: 02/01/2021 CLINICAL DATA:  Right leg weakness for the past 4 days. History of stroke. EXAM: CT HEAD WITHOUT CONTRAST TECHNIQUE: Contiguous axial images were obtained from the base of the skull through the vertex without intravenous contrast. COMPARISON:  CT head dated Jan 06, 2021. FINDINGS: Brain: No evidence of acute infarction, hemorrhage, hydrocephalus, extra-axial collection or mass lesion/mass effect. Stable mild atrophy and moderate chronic microvascular ischemic changes. Vascular: Calcified atherosclerosis at the skullbase. No hyperdense vessel. Skull: Normal. Negative for fracture or focal lesion. Sinuses/Orbits: No acute finding. Other: None. IMPRESSION: 1. No acute intracranial abnormality. 2. Stable  mild atrophy and moderate chronic microvascular ischemic changes. Electronically Signed   By: Titus Dubin M.D.   On: 02/01/2021 12:52   DG Chest Portable 1 View  Result Date: 02/01/2021 CLINICAL DATA:  Chest pain since yesterday EXAM: PORTABLE CHEST 1 VIEW COMPARISON:  01/06/2021 FINDINGS: Prior median sternotomy. Lungs are adequately inflated and otherwise clear. Borderline stable cardiomegaly. Remainder of the exam is unchanged. IMPRESSION: No acute cardiopulmonary disease. Electronically Signed   By: Marin Olp M.D.   On: 02/01/2021 13:59    My personal review of EKG: Rhythm NSR, left anterior fascicular block   Assessment & Plan:    Active Problems:   Right sided weakness   Right-sided weakness-patient has right-sided weakness right leg more than right arm.  CT head is unremarkable.  Will obtain MRI brain without contrast rule out stroke.  Will consult neurology for further evaluation and management. Dizziness-was recently seen at post-COVID care clinic.  He had COVID-19 infection twice.  Since last infection he has been having brain fog, dizziness, memory loss, sleep disturbance also had weight loss.  Will obtain B12 level, check thiamine level, TSH was 0.707 in May 2022.  We will repeat TSH. Chest pain-resolved, patient had chest pain 2 days ago which is currently resolved.  Troponin x2 were unremarkable.  EKG showed no ST changes. Diarrhea-patient denies abdominal pain, has been having loose stool since she started taking Colace a week ago.  We will hold Colace, start normal saline at 75 mL/h. CKD stage IV-creatinine is 3.20, at baseline. Diabetes mellitus type 2-we will start sliding scale insulin NovoLog. Atrial fibrillation-heart rate is controlled, continue Eliquis for anticoagulation. History of chronic diastolic CHF-he had mitral valve replacement, Lasix is currently on hold.  Started on gentle IV hydration with normal saline as above.    DVT Prophylaxis-   Eliquis  AM  Labs Ordered, also please review Full Orders  Family Communication: Admission, patients condition and plan of care including tests being ordered have been discussed with the patient and his wife at bedside who indicate understanding and agree with the plan and Code Status.  Code Status: Full code  Admission status: Inpatient :The appropriate admission status for this patient is INPATIENT. Inpatient status is judged to be reasonable and necessary in order to provide the required intensity of service to ensure the patient's safety. The patient's presenting symptoms, physical exam findings, and initial radiographic and laboratory data in the context of their chronic comorbidities is felt to place them at high risk for further clinical deterioration. Furthermore, it is not anticipated that the patient will be medically stable for discharge from the hospital within 2 midnights of admission. The following factors support the admission status of inpatient.           * I  certify that at the point of admission it is my clinical judgment that the patient will require inpatient hospital care spanning beyond 2 midnights from the point of admission due to high intensity of service, high risk for further deterioration and high frequency of surveillance required.*  Time spent in minutes : 60 minutes   Emiliya Chretien S Advait Buice M.D

## 2021-02-01 NOTE — ED Triage Notes (Signed)
Pt endorses chest pain since yesterday that relieved after nitro. Also not able to lift right leg up for 4 days. 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 CBG 152. Able to lift leg, decreased strength and feeling.

## 2021-02-01 NOTE — Consult Note (Signed)
NEUROLOGY CONSULTATION NOTE   Date of service: February 01, 2021 Patient Name: [name] MRN:  [id number] DOB:  02-27-1946 Reason for consult: "Dizziness and R sided weakness" Requesting Provider: Oswald Hillock, MD _ _ _   _ __   _ __ _ _  __ __   _ __   __ _  History of Present Illness  [name] is a 75 y.o. male with PMH significant for anxiety, afibb on eliquis, CAD, DM2, Depression, CHF, HLD, TIA, mitral regurgitation s/p repair, Osteoarthritis, PTSD, OSA, COVID 40 a month ago who presents with R sided weakness x 3 days and found to have a small subcentimeter subacute L white matter stroke.  Endorses leaning more towards Right when walking, atleast for the last few days. Not entirely sure when it started. Was noted by neighbor to be dragging his R foot and thus told that he might have had a stroke.   He also endorses excessive fatigue, brain fog, apathy, memory loss + sleep disturbance since he was diagnosed with covid in Oct 2021. Was working asa Clinical biochemist prior to that and over the last few months, he is just tired all the time and cant get himself to do anything.  Reports that he was also being given testosterone shots and they were stopped around that time. Has lost more than 20 lbs in the last couple of months.  Everytime, he get up to do any exercise or workout, he get tired very easily.  Has seen several doctors but no changes. Has attempted therapy but unable to get himself to participate.  He got the vaccine after his covid diagnosis along with boosters. Recently got covid again in early may but this episode was mild and he got through this fine.  Endorses eating very little food, feels full very quickly. Unable to taste food or smell it. Had constipation and was prescribed stool softners and then ended up with diarrhea.  mRS: 2 tPA/throbmeectomy: No, outside window LKW: unclear, few days ago atleast. NIHSS components Score: Comment  1a Level of Conscious  0[x]  1[]  2[]  3[]      1b LOC Questions 0[x]  1[]  2[]       1c LOC Commands 0[x]  1[]  2[]       2 Best Gaze 0[x]  1[]  2[]       3 Visual 0[x]  1[]  2[]  3[]      4 Facial Palsy 0[x]  1[]  2[]  3[]      5a Motor Arm - left 0[x]  1[]  2[]  3[]  4[]  UN[]    5b Motor Arm - Right 0[x]  1[]  2[]  3[]  4[]  UN[]    6a Motor Leg - Left 0[x]  1[]  2[]  3[]  4[]  UN[]    6b Motor Leg - Right 0[x]  1[]  2[]  3[]  4[]  UN[]    7 Limb Ataxia 0[x]  1[]  2[]  3[]  UN[]     8 Sensory 0[x]  1[]  2[]  UN[]      9 Best Language 0[x]  1[]  2[]  3[]      10 Dysarthria 0[x]  1[]  2[]  UN[]      11 Extinct. and Inattention 0[x]  1[]  2[]       TOTAL: 0       ROS   Constitutional Endorses weight loss, no fever and chills.   HEENT Denies changes in vision and hearing.  Respiratory Denies SOB and cough.  CV Denies palpitations and CP  GI Denies abdominal pain, nausea, vomiting and diarrhea.  GU Denies dysuria and urinary frequency.  MSK Endorses myalgia and joint pain.  Skin Denies rash and pruritus.  Neurological Denies headache  and syncope.  Psychiatric Denies recent changes in mood. Denies anxiety but endorses depression.   Past History   Past Medical History:  Diagnosis Date  . Allergic rhinitis   . Anxiety   . Atrial fibrillation ([place])   . Cancer ([place])    hx of skin cancers   . Chronic kidney disease   . Coronary artery disease    a. s/p MI & CABG; b. s/p PCI Diag;  c. Cath 12/2011: LAD diff dzs/small, Diag 75% isr, 90 dist to stent (small), LCX & RCA occluded, VG->OM 40, VG->PDA patent - med rx.  . Depression    PTSD  . Diabetes mellitus    not on medications  . Diastolic CHF, chronic (HCC)    a. EF 55-60% by echo 2007  . GERD (gastroesophageal reflux disease)    "not anymore" " I had a lap band"  . History of diverticulitis of colon    5 YRS AGO  . History of pneumonia    2017  . History of transient ischemic attack (TIA)    CAROTID DOPPLERS NOV 2011  0-39& BIL. STENOSIS  . Hyperlipidemia   . Hypertension   . Kidney failure   .  Mitral regurgitation   . Myocardial infarction ([place])   . Neuromuscular disorder ([place])    left arm numbness   . 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 PTSD (post-traumatic stress disorder)    from [place] since 1973  . 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 Shortness of breath dyspnea    with exertion  . Skin cancer   . Sleep apnea    cpap- see ov [facility] in [facility] 05/12/11 for settings   . Stroke ([place])    hx of   Past Surgical History:  Procedure Laterality Date  . AV FISTULA PLACEMENT Left 08/02/2018   Procedure: ARTERIOVENOUS (AV) FISTULA CREATION RADIOCEPHALIC;  Surgeon: Angelia Mould, MD;  Location: [name];  Service: Vascular;  Laterality: Left;  . BLEPHAROPLASTY  1985   BILATERAL  . CARDIAC CATHETERIZATION  2001, 2004, 2009, 2011  . CARDIAC CATHETERIZATION N/A 01/23/2016   Procedure: Right/Left Heart Cath and Coronary Angiography;  Surgeon: [name], MD;  Location: [employer] [facility] CV LAB;  Service: Cardiovascular;  Laterality: N/A;  . CARDIOVERSION N/A 09/07/2017   Procedure: CARDIOVERSION;  Surgeon: [name], MD;  Location: [name] ENDOSCOPY;  Service: Cardiovascular;  Laterality: N/A;  . CARDIOVERSION N/A 09/13/2019   Procedure: CARDIOVERSION;  Surgeon: [name], MD;  Location: [facility] ENDOSCOPY;  Service: Cardiovascular;  Laterality: N/A;  . CARDIOVERSION N/A 10/19/2019   Procedure: CARDIOVERSION;  Surgeon: [name], MD;  Location: [facility] ENDOSCOPY;  Service: Cardiovascular;  Laterality: N/A;  . CATARACT EXTRACTION W/ INTRAOCULAR LENS IMPLANT Bilateral   . CHONDROPLASTY  07/22/2011   Procedure: CHONDROPLASTY;  Surgeon: Dione Plover Aluisio;  Location: [place];  Service: Orthopedics;;  . CIRCUMCISION  34  YRS  AGO  . COLONOSCOPY    . CORONARY ANGIOPLASTY  1996-- POST CABG   W/ STENT, last cath 01/07/2012   . CORONARY ARTERY BYPASS GRAFT  1995   X3 VESSEL  . CORONARY ARTERY BYPASS GRAFT  1995  . CORONARY STENT PLACEMENT    . CYSTOSCOPY/URETEROSCOPY/HOLMIUM LASER/STENT PLACEMENT Right 01/08/2021  Procedure: VQQVZDGLOV/FIEPPIRJJO/ACZYSAYTKZSW/FUXNATF LASER/STENT PLACEMENT;  Surgeon: Franchot Gallo, MD;  Location: [facility];  Service: Urology;  Laterality: Right;  . KNEE ARTHROSCOPY  07/22/2011   Procedure: ARTHROSCOPY KNEE;  Surgeon: [name];  Location: [place];  Service: Orthopedics;  Laterality: Right;  WITH DEBRIDEMENT   . KNEE ARTHROSCOPY W/ MENISCECTOMY  X2 IN 2002-- RIGHT KNEE  . LAPAROSCOPIC GASTRIC BANDING  01-15-09  . LEFT HEART CATH AND CORONARY ANGIOGRAPHY N/A 04/29/2017   Procedure: LEFT HEART CATH AND CORONARY ANGIOGRAPHY;  Surgeon: [name], MD;  Location: [place] CV LAB;  Service: Cardiovascular;  Laterality: N/A;  . LEFT HEART CATHETERIZATION WITH CORONARY ANGIOGRAM N/A 09/11/2013   Procedure: LEFT HEART CATHETERIZATION WITH CORONARY ANGIOGRAM;  Surgeon: [name], MD;  Location: [facility] CATH LAB;  Service: Cardiovascular;  Laterality: N/A;  . MITRAL VALVE REPAIR Right 03/25/2016   Procedure: MINIMALLY INVASIVE REOPERATION FOR MITRAL VALVE REPAIR  (MVR) with size 26 Sorin Memo 3D;  Surgeon: [name], MD;  Location: [place];  Service: Open Heart Surgery;  Laterality: Right;  . MITRAL VALVE REPAIR  03/2016  . RIGHT KNEE ED COMPARTMENT REPLACEMENT  2004  . SYNOVECTOMY  07/22/2011   Procedure: SYNOVECTOMY;  Surgeon: [name];  Location: [place];  Service: Orthopedics;;  . TEE WITHOUT CARDIOVERSION N/A 01/23/2016   Procedure: TRANSESOPHAGEAL ECHOCARDIOGRAM (TEE);  Surgeon: [name], MD;  Location: [place];  Service: Cardiovascular;  Laterality: N/A;  . TEE WITHOUT CARDIOVERSION N/A 03/25/2016   Procedure:  TRANSESOPHAGEAL ECHOCARDIOGRAM (TEE);  Surgeon: [name], MD;  Location: [place];  Service: Open Heart Surgery;  Laterality: N/A;  . UMBILICAL HERNIA REPAIR  01-15-09   W/ GASTRIC BANDING PROCEDURE   Family History  Problem Relation Age of Onset  . Other Mother        joint problems  . Alzheimer's disease Mother   . Hypertension Father   . Heart disease Father   . CVA Father        age 70  . Depression Father   . Heart disease Sister        CABG  . Stroke Sister   . Heart failure Sister        congestive  . Diabetes Sister   . Heart disease Brother   . Hypertension Brother   . Congestive Heart Failure Brother    Social History   Socioeconomic History  . Marital status: Married    Spouse name: [name]  . Number of children: 1  . Years of education: Not on file  . Highest education level: Not on file  Occupational History    Comment: Chief Financial Officer retired  Tobacco Use  . Smoking status: Never  . Smokeless tobacco: Never  Vaping Use  . Vaping Use: Never used  Substance and Sexual Activity  . Alcohol use: Not Currently    Alcohol/week: 2.0 standard drinks    Types: 1 Glasses of wine, 1 Shots of liquor per week    Comment: OCCASIONAL  . Drug use: Never  . Sexual activity: Not on file  Other Topics Concern  . Not on file  Social History Narrative   Lives with wife   occas soda   Social Determinants of Health   Financial Resource Strain: Not on file  Food Insecurity: Not on file  Transportation Needs: Not on file  Physical Activity: Not on file  Stress: Not on file  Social Connections: Not on file  Allergies  Allergen Reactions  . Crestor [Rosuvastatin] Other (See Comments)    Muscle aches   . Lipitor [Atorvastatin] Other (See Comments)    Muscle aches  . Pravastatin Other (See Comments)    Muscle aches  . Carvedilol     Other reaction(s): loss of appetite  . Metformin Diarrhea  . Serotonin Reuptake Inhibitors (Ssris)     Other  reaction(s): Muscle aches  . Zocor [Simvastatin]     Other reaction(s): Muscle pain  . Codeine Itching and Other (See Comments)    Extremity tingling-- can take synthetic    Medications  (Not in a hospital admission)    Vitals   Vitals:   02/01/21 1845 02/01/21 1900 02/01/21 1915 02/01/21 1930  BP: 127/67 128/70 133/75 114/64  Pulse: (!) 58 (!) 58 (!) 58 61  Resp: 15 14 20 15   Temp:      TempSrc:      SpO2: 100% 100% 98% 100%  Weight:      Height:         Body mass index is 24.05 kg/m.  Physical Exam   General: Laying comfortably in bed; in no acute distress. HENT: Normal oropharynx and mucosa. Normal external appearance of ears and nose. Neck: Supple, no pain or tenderness CV: No JVD. No peripheral edema. Pulmonary: Symmetric Chest rise. Normal respiratory effort. Abdomen: Soft to touch, non-tender.  Ext: No cyanosis, edema, or deformity  Skin: No rash. Normal palpation of skin.   Musculoskeletal: Normal digits and nails by inspection. No clubbing.   Neurologic Examination  Mental status/Cognition: Alert, oriented to self, place, month and year, good attention.  Speech/language: Fluent, comprehension intact, object naming intact, repetition intact.  Cranial nerves:   CN II Pupils equal and reactive to light, no VF deficits    CN III,IV,VI EOM intact, no gaze preference or deviation, no nystagmus    CN V normal sensation in V1, V2, and V3 segments bilaterally    CN VII no asymmetry, no nasolabial fold flattening    CN VIII normal hearing to speech    CN IX & X normal palatal elevation, no uvular deviation    CN XI 5/5 head turn and 5/5 shoulder shrug bilaterally    CN XII midline tongue protrusion    Motor:  Muscle bulk: poor, tone normal, pronator drift yes RUE drift tremor no. Mvmt Root Nerve  Muscle Right Left Comments  SA C5/6 Ax Deltoid 4+ 4+   EF C5/6 Mc Biceps 4+ 4+   EE C6/7/8 Rad Triceps 4+ 4+   WF C6/7 Med FCR     WE C7/8 PIN ECU     F Ab C8/T1 U  ADM/FDI 5 5   HF L1/2/3 Fem Illopsoas 4 4+   KE L2/3/4 Fem Quad 4+ 4+   DF L4/5 D Peron Tib Ant 2 5   PF S1/2 Tibial Grc/Sol 5 5    Reflexes:  Right Left Comments  Pectoralis      Biceps (C5/6) 2 2   Brachioradialis (C5/6) 2 2    Triceps (C6/7) 2 2    Patellar (L3/4) 2 2    Achilles (S1)      Hoffman      Plantar     Jaw jerk    Sensation:  Light touch intact   Pin prick    Temperature    Vibration   Proprioception    Coordination/Complex Motor:  - Finger to Nose intact BL - Heel to shin unable to do  due to weakness in RLE. - Rapid alternating movement are slowed in RUE - Gait: unsafe to assess given R foot drop.  Labs   CBC:  Recent Labs  Lab 02/01/21 1211 02/01/21 1233  WBC 7.8  --   NEUTROABS 5.7  --   HGB 11.6* 12.6*  HCT 35.3* 37.0*  MCV 102.9*  --   PLT 168  --     Basic Metabolic Panel:  Lab Results  Component Value Date   NA 135 02/01/2021   K 3.8 02/01/2021   CO2 25 02/01/2021   GLUCOSE 151 (H) 02/01/2021   BUN 32 (H) 02/01/2021   CREATININE 3.20 (H) 02/01/2021   CALCIUM 9.3 02/01/2021   GFRNONAA 20 (L) 02/01/2021   GFRAA 18 (L) 03/07/2020   Lipid Panel:  Lab Results  Component Value Date   LDLCALC 58 10/05/2020   HgbA1c:  Lab Results  Component Value Date   HGBA1C 6.3 (H) 01/07/2021   Urine Drug Screen: No results found for: LABOPIA, COCAINSCRNUR, LABBENZ, AMPHETMU, THCU, LABBARB  Alcohol Level No results found for: [place]  CT Head without contrast: Personally reviewed and CTH was negative for a large hypodensity concerning for a large territory infarct or hyperdensity concerning for an ICH  MR Angio head without contrast and Carotid Duplex BL: pending  MRI Brain: Probable small subcentimeter subacute left cerebral white matter infarct.  Impression   [name] is a 75 y.o. male with PMH significant for anxiety, afibb on eliquis, CAD, DM2, Depression, CHF, HLD, TIA, mitral regurgitation s/p repair, Osteoarthritis, PTSD,  OSA, COVID 19 a month ago who presents with R sided weakness x 3 days and found to have a small subcentimeter subacute L white matter stroke.   His neurologic examination is notable for R foot drop, R hip flexion weakness, slowed rapid alternating movements in R Hand. In addition, also appears deconditioned with weak proximal musculature with no muscle tenderness concerning for myostiis or myopathy.  He has had significant weightloss since diagnosis of COVID in October with residual bran fog, lethargic all the time and unable to do anything along with anosmia and dysgeusia. Likely this is all "Long Covid" but he was taken off testosterone shots around the same time and with his weight loss, is at risk for micronutrient deficiency.  As for his stroke, the appearnce of this stroke with its location in the white matter is most likely due to small vessel disase. I do not think this is eliquis failure.  Primary Diagnosis:  Other cerebral infarction due to occlusion of stenosis of small artery.  Secondary Diagnosis: Long COVID  Recommendations  Plan:  - Frequent Neuro checks per stroke unit protocol - Recommend brain imaging with MRI Brain without contrast - Recommend Vascular imaging with MRA Angio Head without contrast and US Carotid doppler - TTE in feb 2022 with EF of 45-50%, global hypokinesis in L ventricle. No shunt on color flow doppler. I do not think repeating TTE would make any significant change to his management and plan. - Recommend obtaining Lipid panel with LDL - Please start statin if LDL > 70 - Recommend HbA1c - Continue eliquis 5mg  BID(I ordered it) - Recommend DVT ppx - SBP goal - permissive hypertension first 24 h < 220/110. Held home meds.  - Recommend Telemetry monitoring for arrythmia - Recommend bedside swallow screen prior to PO intake. - Stroke education booklet - Recommend PT/OT/SLP consult   In addition to stroke workup above, will recommend following: -  Orthostatic  vitals x 1 - Vit B12, TSH, Folate, Thiamine levels.  ______________________________________________________________________   Thank you for the opportunity to take part in the care of this patient. If you have any further questions, please contact the neurology consultation attending.  Signed,  [place] Pager Number [phone] _ _ _   _ __   _ __ _ _  __ __   _ __   __ _

## 2021-02-01 NOTE — ED Provider Notes (Signed)
[place] EMERGENCY DEPARTMENT Provider Note   CSN: [id number] Arrival date & time: 02/01/21  1159     History Chief Complaint  Patient presents with   Chest Pain    [name] is a 75 y.o. male.   Chest Pain Associated symptoms: fatigue, numbness and weakness   Associated symptoms: no abdominal pain, no back pain and no shortness of breath   Patient presents with chest pain and some confusion and difficulty walking.  Chest pain has been going on the last few days.  Comes and goes.  Is had take his nitroglycerin.  Goes away.  Has been dull in his chest.  Similar to previous chest pains has a known coronary artery disease.  Pain-free now. Patient also had COVID the beginning of last month.  Since then has not been doing all that well.  Has lost of taste and smell.  Reportedly has been more confused and more unsteady.  However over the last few days has been having much more difficulty walking due to his right side.  Has foot drop on the right and more difficulty moving the right side.  States he had some trouble picking up a toolbox.  He is right-handed.  Has had to use a cane.    Past Medical History:  Diagnosis Date   Allergic rhinitis    Anxiety    Atrial fibrillation (HCC)    Cancer (HCC)    hx of skin cancers    Chronic kidney disease    Coronary artery disease    a. s/p MI & CABG; b. s/p PCI Diag;  c. Cath 12/2011: LAD diff dzs/small, Diag 75% isr, 90 dist to stent (small), LCX & RCA occluded, VG->OM 40, VG->PDA patent - med rx.   Depression    PTSD   Diabetes mellitus    not on medications   Diastolic CHF, chronic ([place])    a. EF 55-60% by echo 2007   GERD (gastroesophageal reflux disease)    "not anymore" " I had a lap band"   History of diverticulitis of colon    5 YRS AGO   History of pneumonia    2017   History of transient ischemic attack (TIA)    CAROTID DOPPLERS NOV 2011  0-39& BIL. 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   Complex valvuloplasty including artificial Gore-tex neochord placement x4, plication of anterior commissure, and 26 mm Sorin Memo 3D ring annuloplasty via right mini thoracotomy approach   Shortness of breath dyspnea    with exertion   Skin cancer    Sleep apnea    cpap- see ov note in EPIC 05/12/11 for settings    Stroke [facility])    hx of    Patient Active Problem List   Diagnosis Date Noted   History of COVID-19 01/29/2021   Memory loss 01/29/2021   Olfactory impairment 01/29/2021   Gait disturbance 01/29/2021   Fatigue 01/29/2021   Physical deconditioning 01/29/2021   Pressure injury of skin 01/07/2021   AKI (acute kidney injury) ([place]) 01/06/2021   Generalized weakness 01/06/2021  ARF (acute renal failure) ([place]) 01/06/2021   Dyspnea 10/05/2020   CHF (congestive heart failure) ([place]) 10/04/2020   COVID-19 virus infection 05/24/2020   Chronic kidney disease    Coronary artery disease    Type II diabetes mellitus ([place])    Hypertension    Cancer ([place])    Encounter for therapeutic drug monitoring 04/03/2016   Pneumothorax on right 03/29/2016   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/25/2016   Atherosclerotic heart disease of native coronary artery without angina pectoris 03/10/2016   Heart failure ([place]) 03/10/2016   Cataract extraction status of eye 03/10/2016   Other long term (current) drug therapy 03/10/2016   Osteoarthritis of knee 03/10/2016   Vitamin D deficiency 03/10/2016   Mitral regurgitation    Avitaminosis D 12/25/2015   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   Arthritis of knee, degenerative 12/25/2015   H/O cataract extraction  12/25/2015   Arteriosclerosis of coronary artery 12/25/2015   Congestive heart failure ([place]) 12/25/2015   Pure hypercholesterolemia 12/25/2015   Allergic rhinitis 12/25/2015   Essential (primary) hypertension 12/25/2015   Hypotension 01/29/2014   Expected Blood loss, postoperative 04/29/2012   Pain due to unicompartmental arthroplasty of knee ([place]) 04/27/2012   Preoperative evaluation of a medical condition to rule out surgical contraindications (TAR required) 27/74/1287   Diastolic CHF, chronic ([place]) 06/29/2011   Preoperative evaluation to rule out surgical contraindication 06/29/2011   CAROTID ARTERY STENOSIS 07/09/2010   TIA 05/29/2010   FATIGUE / MALAISE 05/29/2010   CAD, AUTOLOGOUS BYPASS GRAFT 11/15/2008   MYOCARDIAL INFARCTION 12/18/2007   ALLERGIC RHINITIS WITH CONJUNCTIVITIS 12/18/2007   Poorly controlled type 2 diabetes mellitus with circulatory disorder ([place]) 12/05/2007   Hyperlipidemia 12/05/2007   OBESITY 12/05/2007   Essential hypertension 12/05/2007   Coronary atherosclerosis 12/05/2007   DEGENERATIVE JOINT DISEASE 12/05/2007   Obstructive sleep apnea 12/05/2007    Past Surgical History:  Procedure Laterality Date   AV FISTULA PLACEMENT Left 08/02/2018   Procedure: ARTERIOVENOUS (AV) FISTULA CREATION RADIOCEPHALIC;  Surgeon: Angelia Mould, MD;  Location[facility] OR;  Service: Vascular;  Laterality: Left;   BLEPHAROPLASTY  [place]  2001, 2004, 2009, 2011   CARDIAC CATHETERIZATION N/A 01/23/2016   Procedure: Right/Left Heart Cath and Coronary Angiography;  Surgeon: [name], MD;  Location: [place] CV LAB;  Service: Cardiovascular;  Laterality: N/A;   CARDIOVERSION N/A 09/07/2017   Procedure: CARDIOVERSION;  Surgeon: [name], MD;  Location: [facility] ENDOSCOPY;  Service: Cardiovascular;  Laterality: N/A;   CARDIOVERSION N/A 09/13/2019   Procedure: CARDIOVERSION;  Surgeon: [name], MD;  Location: [facility] ENDOSCOPY;   Service: Cardiovascular;  Laterality: N/A;   CARDIOVERSION N/A 10/19/2019   Procedure: CARDIOVERSION;  Surgeon: [name], MD;  Location: [facility] ENDOSCOPY;  Service: Cardiovascular;  Laterality: N/A;   CATARACT EXTRACTION W/ INTRAOCULAR LENS IMPLANT Bilateral    CHONDROPLASTY  07/22/2011   Procedure: CHONDROPLASTY;  Surgeon: Dione Plover Aluisio;  Location: [place];  Service: Orthopedics;;   CIRCUMCISION  [place]-- POST CABG   W/ STENT, last cath 01/07/2012    CORONARY ARTERY BYPASS GRAFT  1995   X3 VESSEL   CORONARY ARTERY BYPASS GRAFT  1995   CORONARY STENT PLACEMENT     CYSTOSCOPY/URETEROSCOPY/HOLMIUM LASER/STENT PLACEMENT Right 01/08/2021   Procedure: CYSTOSCOPY/RETROGRADE/URETEROSCOPY/HOLMIUM LASER/STENT PLACEMENT;  Surgeon: Franchot Gallo, MD;  Location: [facility] ORS;  Service: Urology;  Laterality: Right;  KNEE ARTHROSCOPY  07/22/2011   Procedure: ARTHROSCOPY KNEE;  Surgeon: [name];  Location: [place];  Service: Orthopedics;  Laterality: Right;  WITH DEBRIDEMENT    KNEE ARTHROSCOPY W/ MENISCECTOMY  X2 IN 2002-- RIGHT KNEE   LAPAROSCOPIC GASTRIC BANDING  01-15-09   LEFT HEART CATH AND CORONARY ANGIOGRAPHY N/A 04/29/2017   Procedure: LEFT HEART CATH AND CORONARY ANGIOGRAPHY;  Surgeon: [name], MD;  Location: [place] CV LAB;  Service: Cardiovascular;  Laterality: N/A;   LEFT HEART CATHETERIZATION WITH CORONARY ANGIOGRAM N/A 09/11/2013   Procedure: LEFT HEART CATHETERIZATION WITH CORONARY ANGIOGRAM;  Surgeon: [name], MD;  Location[facility] CATH LAB;  Service: Cardiovascular;  Laterality: N/A;   MITRAL VALVE REPAIR Right 03/25/2016   Procedure: MINIMALLY INVASIVE REOPERATION FOR MITRAL VALVE REPAIR  (MVR) with size 26 Sorin Memo 3D;  Surgeon: [name], MD;  Location: [place];  Service: Open Heart Surgery;  Laterality: Right;   MITRAL VALVE REPAIR  03/2016   RIGHT KNEE ED  COMPARTMENT REPLACEMENT  2004   SYNOVECTOMY  07/22/2011   Procedure: SYNOVECTOMY;  Surgeon: Dione Plover Aluisio;  Location: [place];  Service: Orthopedics;;   TEE WITHOUT CARDIOVERSION N/A 01/23/2016   Procedure: TRANSESOPHAGEAL ECHOCARDIOGRAM (TEE);  Surgeon: [name], MD;  Location: [place];  Service: Cardiovascular;  Laterality: N/A;   TEE WITHOUT CARDIOVERSION N/A 03/25/2016   Procedure: TRANSESOPHAGEAL ECHOCARDIOGRAM (TEE);  Surgeon: [name], MD;  Location: [place];  Service: Open Heart Surgery;  Laterality: N/A;   UMBILICAL HERNIA REPAIR  01-15-09   W/ GASTRIC BANDING PROCEDURE       Family History  Problem Relation Age of Onset   Other Mother        joint problems   Alzheimer's disease Mother    Hypertension Father    Heart disease Father    CVA Father        age 43   Depression Father    Heart disease Sister        CABG   Stroke Sister    Heart failure Sister        congestive   Diabetes Sister    Heart disease Brother    Hypertension Brother    Congestive Heart Failure Brother     Social History   Tobacco Use   Smoking status: Never   Smokeless tobacco: Never  Vaping Use   Vaping Use: Never used  Substance Use Topics   Alcohol use: Not Currently    Alcohol/week: 2.0 standard drinks    Types: 1 Glasses of wine, 1 Shots of liquor per week    Comment: OCCASIONAL   Drug use: Never    Home Medications Prior to Admission medications   Medication Sig Start Date End Date Taking? Authorizing Provider  Alirocumab (PRALUENT) 75 MG/ML SOAJ Inject 75 mg into the skin every 14 (fourteen) days.   Yes [provider]  allopurinol (ZYLOPRIM) 100 MG tablet Take 0.5 tablets (50 mg total) by mouth 2 (two) times a week. Please discuss dosing with your PCP based on your renal function. 01/13/21 02/12/21 Yes [name], MD  amiodarone (PACERONE) 200 MG tablet Take 1 tablet (200 mg total) by mouth daily. 10/19/19  Yes [name], MD  apixaban (ELIQUIS) 5 MG TABS tablet Take 1 tablet (5 mg total) by mouth 2 (two) times daily. 09/09/17  Yes [name], MD  Ascorbic Acid (VITAMIN C WITH ROSE HIPS)  1000 MG tablet Take 1,000 mg by mouth daily.   Yes [provider]  calcitRIOL (ROCALTROL) 0.25 MCG capsule Take 0.25 mcg by mouth every other day.   Yes [provider]  cetirizine (ZYRTEC) 10 MG tablet Take 10 mg by mouth daily.   Yes [provider]  colchicine 0.6 MG tablet Take 0.6-1.2 mg by mouth 2 (two) times daily as needed (gout).  04/14/19  Yes [provider]  fluticasone (FLONASE) 50 MCG/ACT nasal spray Place 1 spray into both nostrils daily as needed for allergies or rhinitis.   Yes [provider]  HYDROcodone-acetaminophen (NORCO/VICODIN) 5-325 MG tablet Take 1 tablet by mouth daily as needed for severe pain.   Yes [provider]  nitroGLYCERIN (NITROSTAT) 0.4 MG SL tablet 1 TAB UNDER THE TONGUE EVERY 5 MINUTES AS NEEDED FOR CHEST PAIN UP TO 3 DOSES, IF PERSIST CALL 911 Patient taking differently: Place 0.4 mg under the tongue every 5 (five) minutes as needed for chest pain. 03/05/20  Yes [name], MD  pantoprazole (PROTONIX) 40 MG tablet Take 40 mg by mouth daily as needed (heartburn).   Yes [provider]  Semaglutide,0.25 or 0.5MG /DOS, (OZEMPIC, 0.25 OR 0.5 MG/DOSE,) 2 MG/1.5ML SOPN Inject 0.375 mLs (0.5 mg total) into the skin every Thursday. Patient taking differently: Inject 0.5 mg into the skin once a week. 02/01/20  Yes Philemon Kingdom, MD  tadalafil (CIALIS) 20 MG tablet TAKE 1 TABLET BY MOUTH ONCE DAILY AS NEEDED Patient taking differently: Take 20 mg by mouth daily as needed for erectile dysfunction. 01/08/20  Yes McKenzie, Candee Furbish, MD  traZODone (DESYREL) 100 MG tablet Take 100 mg by mouth at bedtime.   Yes [provider]    Allergies    Crestor [rosuvastatin], Lipitor [atorvastatin], Pravastatin, Carvedilol,  Metformin, Serotonin reuptake inhibitors (ssris), Zocor [simvastatin], and Codeine  Review of Systems   Review of Systems  Constitutional:  Positive for fatigue. Negative for appetite change.  HENT:  Negative for congestion.   Respiratory:  Negative for shortness of breath.   Cardiovascular:  Positive for chest pain.  Gastrointestinal:  Negative for abdominal pain.  Musculoskeletal:  Negative for back pain.  Skin:  Negative for rash.  Neurological:  Positive for weakness and numbness.  Psychiatric/Behavioral:  Negative for confusion.    Physical Exam Updated Vital Signs BP 120/66   Pulse 61   Temp 97.8 F (36.6 C) (Oral)   Resp 19   Ht 5' 10.5" (1.791 m)   Wt 77.1 kg   SpO2 98%   BMI 24.05 kg/m   Physical Exam Vitals reviewed.  Constitutional:      Appearance: He is well-developed.  HENT:     Head: Atraumatic.  Eyes:     Extraocular Movements: Extraocular movements intact.  Cardiovascular:     Rate and Rhythm: Normal rate and regular rhythm.  Pulmonary:     Breath sounds: No wheezing, rhonchi or rales.  Chest:     Chest wall: No tenderness.  Abdominal:     Tenderness: There is no abdominal tenderness.  Musculoskeletal:     Right lower leg: No tenderness. No edema.     Left lower leg: No tenderness. No edema.  Skin:    General: Skin is warm.     Capillary Refill: Capillary refill takes less than 2 seconds.  Neurological:     Mental Status: He is alert.     Comments: Face symmetric eye movements intact.  Strength appears slightly decreased in  right upper extremity compared to left.  Sensation grossly intact.  Mildly decreased strength on straight leg raise on the right also.  Decent extension at knee.  However has foot drop on the right.  States decreased sensation on right lower extremity also.    ED Results / Procedures / Treatments   Labs (all labs ordered are listed, but only abnormal results are displayed) Labs Reviewed  PROTIME-INR - Abnormal; Notable for  the following components:      Result Value   Prothrombin Time 19.1 (*)    INR 1.6 (*)    All other components within normal limits  CBC - Abnormal; Notable for the following components:   RBC 3.43 (*)    Hemoglobin 11.6 (*)    HCT 35.3 (*)    MCV 102.9 (*)    All other components within normal limits  COMPREHENSIVE METABOLIC PANEL - Abnormal; Notable for the following components:   Glucose, Bld 153 (*)    BUN 29 (*)    Creatinine, Ser 3.10 (*)    Total Protein 5.6 (*)    Albumin 3.3 (*)    GFR, Estimated 20 (*)    All other components within normal limits  I-STAT CHEM 8, ED - Abnormal; Notable for the following components:   BUN 32 (*)    Creatinine, Ser 3.20 (*)    Glucose, Bld 151 (*)    Hemoglobin 12.6 (*)    HCT 37.0 (*)    All other components within normal limits  CBG MONITORING, ED - Abnormal; Notable for the following components:   Glucose-Capillary 152 (*)    All other components within normal limits  APTT  DIFFERENTIAL  TROPONIN I (HIGH SENSITIVITY)  TROPONIN I (HIGH SENSITIVITY)    EKG EKG Interpretation  Date/Time:  Saturday February 01 2021 12:10:21 EDT Ventricular Rate:  67 PR Interval:  190 QRS Duration: 122 QT Interval:  446 QTC Calculation: 471 R Axis:   -57 Text Interpretation: Normal sinus rhythm Left anterior fascicular block Minimal voltage criteria for LVH, may be normal variant ( Cornell product ) Abnormal ECG No significant change since last tracing Confirmed by Davonna Belling [phone]) on 02/01/2021 1:04:47 PM  Radiology CT HEAD WO CONTRAST  Result Date: 02/01/2021 CLINICAL DATA:  Right leg weakness for the past 4 days. History of stroke. EXAM: CT HEAD WITHOUT CONTRAST TECHNIQUE: Contiguous axial images were obtained from the base of the skull through the vertex without intravenous contrast. COMPARISON:  CT head dated Jan 06, 2021. FINDINGS: Brain: No evidence of acute infarction, hemorrhage, hydrocephalus, extra-axial collection or mass  lesion/mass effect. Stable mild atrophy and moderate chronic microvascular ischemic changes. Vascular: Calcified atherosclerosis at the skullbase. No hyperdense vessel. Skull: Normal. Negative for fracture or focal lesion. Sinuses/Orbits: No acute finding. Other: None. IMPRESSION: 1. No acute intracranial abnormality. 2. Stable mild atrophy and moderate chronic microvascular ischemic changes. Electronically Signed   By: Titus Dubin M.D.   On: 02/01/2021 12:52   DG Chest Portable 1 View  Result Date: 02/01/2021 CLINICAL DATA:  Chest pain since yesterday EXAM: PORTABLE CHEST 1 VIEW COMPARISON:  01/06/2021 FINDINGS: Prior median sternotomy. Lungs are adequately inflated and otherwise clear. Borderline stable cardiomegaly. Remainder of the exam is unchanged. IMPRESSION: No acute cardiopulmonary disease. Electronically Signed   By: Marin Olp M.D.   On: 02/01/2021 13:59    Procedures Procedures   Medications Ordered in ED Medications  sodium chloride flush (NS) 0.9 % injection 3 mL (has no administration in time  range)    ED Course  I have reviewed the triage vital signs and the nursing notes.  Pertinent labs & imaging results that were available during my care of the patient were reviewed by me and considered in my medical decision making (see chart for details).    MDM Rules/Calculators/A&P                          Patient presents with chest pain.  Dull at times.  Comes and goes.  Not exertional.  Had taken nitro twice which is somewhat unusual for him.  States he did not have to take a second nitroglycerin either time.  Troponins negative.  EKG reassuring.  Doubt acute cardiac ischemia but I think it is worth more work-up with his history. However patient also has some right-sided weakness.  Particularly in his right foot with the foot drop, however has some milder weakness on the right compared to left of the arm and the leg.  May have some paresthesias on the foot.  Reportedly has  been worsening for a while but particularly over the last 3 days.  I do not think patient is a tPA candidate since symptoms have been for at least 3 days.  Has been started dwindling since he had COVID around a month and a half ago.  Head CT reassuring.  However with focal neurodeficits I feels the patient would benefit from mission to the hospital.  Will discuss with hospitalist Final Clinical Impression(s) / ED Diagnoses Final diagnoses:  Chest pain, unspecified type  Right foot drop  Right sided weakness    Rx / DC Orders ED Discharge Orders     None        Davonna Belling, MD 02/01/21 2007

## 2021-02-01 NOTE — ED Notes (Signed)
Patient transported to X-ray 

## 2021-02-02 ENCOUNTER — Inpatient Hospital Stay (HOSPITAL_COMMUNITY): Payer: No Typology Code available for payment source

## 2021-02-02 ENCOUNTER — Encounter (HOSPITAL_COMMUNITY): Payer: No Typology Code available for payment source

## 2021-02-02 ENCOUNTER — Other Ambulatory Visit: Payer: Self-pay

## 2021-02-02 DIAGNOSIS — R531 Weakness: Secondary | ICD-10-CM

## 2021-02-02 DIAGNOSIS — E43 Unspecified severe protein-calorie malnutrition: Secondary | ICD-10-CM

## 2021-02-02 DIAGNOSIS — I6330 Cerebral infarction due to thrombosis of unspecified cerebral artery: Secondary | ICD-10-CM

## 2021-02-02 LAB — CBC
HCT: 35.4 % — ABNORMAL LOW (ref 39.0–52.0)
Hemoglobin: 11.9 g/dL — ABNORMAL LOW (ref 13.0–17.0)
MCH: 34.1 pg — ABNORMAL HIGH (ref 26.0–34.0)
MCHC: 33.6 g/dL (ref 30.0–36.0)
MCV: 101.4 fL — ABNORMAL HIGH (ref 80.0–100.0)
Platelets: 167 10*3/uL (ref 150–400)
RBC: 3.49 MIL/uL — ABNORMAL LOW (ref 4.22–5.81)
RDW: 13.7 % (ref 11.5–15.5)
WBC: 7.2 10*3/uL (ref 4.0–10.5)
nRBC: 0 % (ref 0.0–0.2)

## 2021-02-02 LAB — GLUCOSE, CAPILLARY
Glucose-Capillary: 127 mg/dL — ABNORMAL HIGH (ref 70–99)
Glucose-Capillary: 130 mg/dL — ABNORMAL HIGH (ref 70–99)
Glucose-Capillary: 134 mg/dL — ABNORMAL HIGH (ref 70–99)
Glucose-Capillary: 147 mg/dL — ABNORMAL HIGH (ref 70–99)
Glucose-Capillary: 150 mg/dL — ABNORMAL HIGH (ref 70–99)

## 2021-02-02 LAB — RESP PANEL BY RT-PCR (FLU A&B, COVID) ARPGX2
Influenza A by PCR: NEGATIVE
Influenza B by PCR: NEGATIVE
SARS Coronavirus 2 by RT PCR: NEGATIVE

## 2021-02-02 LAB — COMPREHENSIVE METABOLIC PANEL
ALT: 32 U/L (ref 0–44)
AST: 27 U/L (ref 15–41)
Albumin: 3.1 g/dL — ABNORMAL LOW (ref 3.5–5.0)
Alkaline Phosphatase: 95 U/L (ref 38–126)
Anion gap: 9 (ref 5–15)
BUN: 31 mg/dL — ABNORMAL HIGH (ref 8–23)
CO2: 26 mmol/L (ref 22–32)
Calcium: 9.3 mg/dL (ref 8.9–10.3)
Chloride: 103 mmol/L (ref 98–111)
Creatinine, Ser: 2.88 mg/dL — ABNORMAL HIGH (ref 0.61–1.24)
GFR, Estimated: 22 mL/min — ABNORMAL LOW (ref >60)
Glucose, Bld: 96 mg/dL (ref 70–99)
Potassium: 4 mmol/L (ref 3.5–5.1)
Sodium: 138 mmol/L (ref 135–145)
Total Bilirubin: 0.8 mg/dL (ref 0.3–1.2)
Total Protein: 5.1 g/dL — ABNORMAL LOW (ref 6.5–8.1)

## 2021-02-02 LAB — VITAMIN B12: Vitamin B-12: 630 pg/mL (ref 180–914)

## 2021-02-02 LAB — LIPID PANEL
Cholesterol: 115 mg/dL (ref 0–200)
HDL: 46 mg/dL (ref >40)
LDL Cholesterol: 50 mg/dL (ref 0–99)
Total CHOL/HDL Ratio: 2.5 RATIO
Triglycerides: 94 mg/dL (ref <150)
VLDL: 19 mg/dL (ref 0–40)

## 2021-02-02 LAB — FOLATE: Folate: 8.6 ng/mL (ref >5.9)

## 2021-02-02 LAB — TSH: TSH: 2.33 u[IU]/mL (ref 0.350–4.500)

## 2021-02-02 MED ORDER — ONDANSETRON HCL 4 MG PO TABS: 4.0000 mg | ORAL_TABLET | Freq: Four times a day (QID) | ORAL | Status: DC | PRN

## 2021-02-02 MED ORDER — AMIODARONE HCL 200 MG PO TABS
200.0000 mg | ORAL_TABLET | Freq: Every day | ORAL | Status: DC
  Administered 2021-02-02 – 2021-02-04 (×3): 200 mg via ORAL
  Filled 2021-02-02 (×3): qty 1

## 2021-02-02 MED ORDER — APIXABAN 5 MG PO TABS
5.0000 mg | ORAL_TABLET | Freq: Two times a day (BID) | ORAL | Status: DC
  Administered 2021-02-02 – 2021-02-04 (×6): 5 mg via ORAL
  Filled 2021-02-02 (×6): qty 1

## 2021-02-02 MED ORDER — ONDANSETRON HCL 4 MG/2ML IJ SOLN: 4.0000 mg | Freq: Four times a day (QID) | INTRAMUSCULAR | Status: DC | PRN

## 2021-02-02 MED ORDER — ASCORBIC ACID 500 MG PO TABS
1000.0000 mg | ORAL_TABLET | Freq: Every day | ORAL | Status: DC
  Administered 2021-02-02 – 2021-02-04 (×3): 1000 mg via ORAL
  Filled 2021-02-02 (×3): qty 2

## 2021-02-02 MED ORDER — CALCITRIOL 0.25 MCG PO CAPS
0.2500 ug | ORAL_CAPSULE | ORAL | Status: DC
  Administered 2021-02-02 – 2021-02-04 (×2): 0.25 ug via ORAL
  Filled 2021-02-02 (×2): qty 1

## 2021-02-02 MED ORDER — TRAZODONE HCL 100 MG PO TABS
100.0000 mg | ORAL_TABLET | Freq: Every day | ORAL | Status: DC
  Administered 2021-02-02 – 2021-02-03 (×2): 100 mg via ORAL
  Filled 2021-02-02 (×2): qty 1

## 2021-02-02 MED ORDER — PANTOPRAZOLE SODIUM 40 MG PO TBEC
40.0000 mg | DELAYED_RELEASE_TABLET | Freq: Every day | ORAL | Status: DC
  Administered 2021-02-02 – 2021-02-04 (×3): 40 mg via ORAL
  Filled 2021-02-02 (×3): qty 1

## 2021-02-02 MED ORDER — APIXABAN 5 MG PO TABS: 5.0000 mg | ORAL_TABLET | Freq: Two times a day (BID) | ORAL | Status: DC

## 2021-02-02 NOTE — Progress Notes (Addendum)
Patient HR sinus brady with range of 38-59. Patient not having symptoms or chest pain. When asked if patient usually has a low heart rate during rest/sleep, he replies with "I know it can get low cause I also have sleep apnea, but I didn't know how low it could get."   Cardiac Monitoring service also made me aware of pt having 2 seconds to 2.56 second pause at some intervals.   Longest pause was 3.11 seconds at 0650 on 6/12 morning.    Night MD has been paged to make aware.

## 2021-02-02 NOTE — ED Notes (Signed)
Tried to call report to 3W, phone just kept ringing. Will call back in a few minutes

## 2021-02-02 NOTE — Evaluation (Signed)
Occupational Therapy Evaluation Patient Details Name: [name] MRN: [id number] DOB: [birth year] Today's Date: 02/02/2021    History of Present Illness [name]  is a 75 y.o. male, admited with past 3 days having right-sided leg weakness and also mild arm weakness. GU:[id number]; MRI: Probable small subcentimeter subacute left cerebral white matter infarct. Patient has been dragging his right foot while walking. Has been having diarrhea for past 2 days. PHMx:  atrial fibrillation, DM2, chronic diastolic CHF, CKD stage IV, hyperlipidemia, HTN, CAD s/p CABG, COVID 19 infection a month ago and also last year.Patient states that since her recent COVID infection he has had ongoing loss of taste and smell, decreased appetite, 20 pound weight loss, brain fog, memory loss, sleep disturbance. He also has history of gout   Clinical Impression   This 75 yo male admitted with above presents to acute OT with PLOF of being totally independent with basic ADLs but had not been doing much of anything else in the last two weeks due to decreased endurance for activity. He currently is setup/S-min guard A for all basic ADLs and Independent to min guard A for all mobility, he has drop foot on right with trace movement. His wife is a retired Engineer, drilling from [employer] and is able to manage him at this level as well as he should be able to get back to his baseline pretty quickly as well. They have a handicapped accessible home.No further OT needs, we will sign off.    Follow Up Recommendations  No OT follow up;Supervision/Assistance - 24 hour    Equipment Recommendations  None recommended by OT       Precautions / Restrictions Precautions Precautions: Fall Restrictions Weight Bearing Restrictions: No      Mobility Bed Mobility Overal bed mobility: Independent                  Transfers Overall transfer level: Needs assistance Equipment used: None Transfers: Sit to/from Stand Sit to  Stand: Supervision         General transfer comment: Ambulation in hallway min guard A without AD    Balance Overall balance assessment: Mild deficits observed, not formally tested                                         ADL either performed or assessed with clinical judgement   ADL Overall ADL's : Needs assistance/impaired Eating/Feeding: Independent;Sitting   Grooming: Set up;Standing   Upper Body Bathing: Set up;Sitting;Standing   Lower Body Bathing: Set up;Sit to/from stand   Upper Body Dressing : Set up;Sitting   Lower Body Dressing: Set up;Sit to/from stand   Toilet Transfer: Min guard;Ambulation   Toileting- Clothing Manipulation and Hygiene: Supervision/safety;Sit to/from stand               Vision Patient Visual Report: No change from baseline Vision Assessment?: Yes Eye Alignment: Within Functional Limits Ocular Range of Motion: Within Functional Limits Alignment/Gaze Preference: Within Defined Limits Tracking/Visual Pursuits: Able to track stimulus in all quads without difficulty Saccades: Within functional limits Convergence: Within functional limits Visual Fields: No apparent deficits            Pertinent Vitals/Pain Pain Assessment: No/denies pain     Hand Dominance Right   Extremity/Trunk Assessment Upper Extremity Assessment Upper Extremity Assessment: Overall WFL for tasks assessed  Communication Communication Communication: No difficulties   Cognition Arousal/Alertness: Awake/alert Behavior During Therapy: WFL for tasks assessed/performed Overall Cognitive Status: Within Functional Limits for tasks assessed                                                Home Living Family/patient expects to be discharged to:: Private residence Living Arrangements: Spouse/significant other Available Help at Discharge: Family;Available 24 hours/day Type of Home: House Home Access: Level entry      Home Layout: Multi-level;Able to live on main level with bedroom/bathroom     Bathroom Shower/Tub: Occupational psychologist: Handicapped height     Home Equipment: Clinical cytogeneticist - 2 wheels;Cane - single point;Bedside commode          Prior Functioning/Environment Level of Independence: Independent        Comments: likes to swing dance, wife was an OR Nurse at [employer] for 40 years        OT Problem List: Decreased strength;Decreased range of motion;Impaired balance (sitting and/or standing)      OT Treatment/Interventions:      OT Goals(Current goals can be found in the care plan section) Acute Rehab OT Goals Patient Stated Goal: to get a shower, to go home                AM-PAC OT "6 Clicks" Daily Activity     Outcome Measure Help from another person eating meals?: None Help from another person taking care of personal grooming?: A Little Help from another person toileting, which includes using toliet, bedpan, or urinal?: A Little Help from another person bathing (including washing, rinsing, drying)?: A Little Help from another person to put on and taking off regular upper body clothing?: A Little Help from another person to put on and taking off regular lower body clothing?: A Little 6 Click Score: 19   End of Session Equipment Utilized During Treatment: Gait belt  Activity Tolerance: Patient tolerated treatment well Patient left:  (sitting EOB waiting to for staff to disconnect IV so he can shower)  OT Visit Diagnosis: Unsteadiness on feet (R26.81);Other abnormalities of gait and mobility (R26.89)                Time: 8675-4492 OT Time Calculation (min): 32 min Charges:  OT General Charges $OT Visit: 1 Visit OT Evaluation $OT Eval Moderate Complexity: 1 Mod OT Treatments $Self Care/Home Management : 8-22 mins  Golden Circle, OTR/L Acute [employer] Pager [phone] Office [phone]    Almon Register 02/02/2021, 6:08  PM

## 2021-02-02 NOTE — Discharge Instructions (Signed)

## 2021-02-02 NOTE — Progress Notes (Addendum)
PROGRESS NOTE    [name]  FTD:[id number] DOB: 17-Jun-1946 DOA: 02/01/2021 PCP: [name], MD   Chief Complaint  Patient presents with   Chest Pain    Brief Narrative:   75 year old gentleman prior history of atrial fibrillation on chronic anticoagulation with Eliquis, type 2 diabetes, chronic systolic heart failure, stage IV CKD, hypertension and hyperlipidemia, coronary artery disease s/p CABG, recent COVID-19 infection presents with right-sided leg weakness and arm weakness. Initial CT of the head of negative for acute stroke.  Is admitted for evaluation of stroke  Assessment & Plan:   Active Problems:   Right sided weakness  Small subcentimeter subacute left cerebral white matter infarct. Further stroke work-up to follow, allergy on board, further recommendations to follow. Continue with Eliquis 5 mg twice daily. LDL is 50, hemoglobin A1c is pending. Therapy evaluations are pending echocardiogram is pending    Macrocytic anemia Vitamin C62 and folic acid levels are within normal limits.   Stage IV CKD Creatinine at baseline.    Type 2 diabetes mellitus Continue with sliding scale insulin, get hemoglobin A1c.    Hyperlipidemia LDL is 50,    Chronic atrial fibrillation Rate control with amiodarone and on Eliquis for anticoagulation.    GERD Stable on PPI.     DVT prophylaxis: eliquis Code Status: (Full Code Family Communication: None at bedside.  Disposition:   Status is: Inpatient  Remains inpatient appropriate because:Ongoing diagnostic testing needed not appropriate for outpatient work up and Unsafe d/c plan  Dispo: The patient is from: Home              Anticipated d/c is to:  pending.               Patient currently is not medically stable to d/c.   Difficult to place patient No       Consultants:   Neurology.   Procedures:   Echocardiogram.   Antimicrobials: none.    Subjective: No chest pain or sob.    Objective: Vitals:   02/01/21 2045 02/02/21 0132 02/02/21 0408 02/02/21 0718  BP: 123/69 120/72 124/65 111/65  Pulse: (!) 59 (!) 57 62 (!) 50  Resp: 15 17 17 18   Temp:  98.4 F (36.9 C) 98.3 F (36.8 C) 97.6 F (36.4 C)  TempSrc:  Oral Oral Oral  SpO2: 99% 100% 99% 100%  Weight:      Height:        Intake/Output Summary (Last 24 hours) at 02/02/2021 1123 Last data filed at 02/02/2021 0634 Gross per 24 hour  Intake 859.9 ml  Output --  Net 859.9 ml   Filed Weights   02/01/21 1206  Weight: 77.1 kg    Examination:  General exam: Appears calm and comfortable  Respiratory system: Clear to auscultation. Respiratory effort normal. Cardiovascular system: S1 & S2 heard, RRR. No JVD, murmurs, rubs, gallops or clicks. No pedal edema. Gastrointestinal system: Abdomen is nondistended, soft and nontenderNormal bowel sounds heard. Central nervous system: Alert and oriented. No focal neurological deficits. Extremities: Symmetric 5 x 5 power. Skin: No rashes, lesions or ulcers Psychiatry:  Mood & affect appropriate.     Data Reviewed: I have personally reviewed following labs and imaging studies  CBC: Recent Labs  Lab 02/01/21 1211 02/01/21 1233 02/02/21 0214  WBC 7.8  --  7.2  NEUTROABS 5.7  --   --   HGB 11.6* 12.6* 11.9*  HCT 35.3* 37.0* 35.4*  MCV 102.9*  --  101.4*  PLT  168  --  505    Basic Metabolic Panel: Recent Labs  Lab 02/01/21 1211 02/01/21 1233 02/02/21 0214  NA 135 135 138  K 3.9 3.8 4.0  CL 101 101 103  CO2 25  --  26  GLUCOSE 153* 151* 96  BUN 29* 32* 31*  CREATININE 3.10* 3.20* 2.88*  CALCIUM 9.3  --  9.3    GFR: Estimated Creatinine Clearance: 23.3 mL/min (A) (by C-G formula based on SCr of 2.88 mg/dL (H)).  Liver Function Tests: Recent Labs  Lab 02/01/21 1211 02/02/21 0214  AST 28 27  ALT 35 32  ALKPHOS 116 95  BILITOT 0.8 0.8  PROT 5.6* 5.1*  ALBUMIN 3.3* 3.1*    CBG: Recent Labs  Lab 02/01/21 1207 02/01/21 2358  02/02/21 0612  GLUCAP 152* 148* 134*     Recent Results (from the past 240 hour(s))  Resp Panel by RT-PCR (Flu A&B, Covid)     Status: None   Collection Time: 02/02/21  2:46 AM  Result Value Ref Range Status   SARS Coronavirus 2 by RT PCR NEGATIVE NEGATIVE Final    Comment: (NOTE) SARS-CoV-2 target nucleic acids are NOT DETECTED.  The SARS-CoV-2 RNA is generally detectable in upper respiratory specimens during the acute phase of infection. The lowest concentration of SARS-CoV-2 viral copies this assay can detect is 138 copies/mL. A negative result does not preclude SARS-Cov-2 infection and should not be used as the sole basis for treatment or other patient management decisions. A negative result may occur with  improper specimen collection/handling, submission of specimen other than nasopharyngeal swab, presence of viral mutation(s) within the areas targeted by this assay, and inadequate number of viral copies(<138 copies/mL). A negative result must be combined with clinical observations, patient history, and epidemiological information. The expected result is Negative.  Fact Sheet for Patients:  EntrepreneurPulse.com.au  Fact Sheet for Healthcare Providers:  IncredibleEmployment.be  This test is no t yet approved or cleared by the [place] FDA and  has been authorized for detection and/or diagnosis of SARS-CoV-2 by FDA under an Emergency Use Authorization (EUA). This EUA will remain  in effect (meaning this test can be used) for the duration of the COVID-19 declaration under Section 564(b)(1) of the Act, 21 U.S.C.section 360bbb-3(b)(1), unless the authorization is terminated  or revoked sooner.       Influenza A by PCR NEGATIVE NEGATIVE Final   Influenza B by PCR NEGATIVE NEGATIVE Final    Comment: (NOTE) The Xpert Xpress SARS-CoV-2/FLU/RSV plus assay is intended as an aid in the diagnosis of influenza from Nasopharyngeal swab  specimens and should not be used as a sole basis for treatment. Nasal washings and aspirates are unacceptable for Xpert Xpress SARS-CoV-2/FLU/RSV testing.  Fact Sheet for Patients: EntrepreneurPulse.com.au  Fact Sheet for Healthcare Providers: IncredibleEmployment.be  This test is not yet approved or cleared by the [place] FDA and has been authorized for detection and/or diagnosis of SARS-CoV-2 by FDA under an Emergency Use Authorization (EUA). This EUA will remain in effect (meaning this test can be used) for the duration of the COVID-19 declaration under Section 564(b)(1) of the Act, 21 U.S.C. section 360bbb-3(b)(1), unless the authorization is terminated or revoked.  Performed at [facility], [place] [address], [place], [place] [address]          Radiology Studies: CT HEAD WO CONTRAST  Result Date: 02/01/2021 CLINICAL DATA:  Right leg weakness for the past 4 days. History of stroke. EXAM: CT HEAD  WITHOUT CONTRAST TECHNIQUE: Contiguous axial images were obtained from the base of the skull through the vertex without intravenous contrast. COMPARISON:  CT head dated Jan 06, 2021. FINDINGS: Brain: No evidence of acute infarction, hemorrhage, hydrocephalus, extra-axial collection or mass lesion/mass effect. Stable mild atrophy and moderate chronic microvascular ischemic changes. Vascular: Calcified atherosclerosis at the skullbase. No hyperdense vessel. Skull: Normal. Negative for fracture or focal lesion. Sinuses/Orbits: No acute finding. Other: None. IMPRESSION: 1. No acute intracranial abnormality. 2. Stable mild atrophy and moderate chronic microvascular ischemic changes. Electronically Signed   By: Titus Dubin M.D.   On: 02/01/2021 12:52   MR ANGIO HEAD WO CONTRAST  Result Date: 02/02/2021 CLINICAL DATA:  Initial evaluation for neuro deficit, right-sided weakness, small stroke on prior brain MRI. EXAM: MRA HEAD WITHOUT CONTRAST  TECHNIQUE: Angiographic images of the Circle of Willis were acquired using MRA technique without intravenous contrast. COMPARISON:  Comparison made with previous brain MRI from 02/01/2021. FINDINGS: Anterior circulation: Visualized distal cervical segments of the internal carotid arteries are widely patent with antegrade flow. Petrous, cavernous, and supraclinoid segments widely patent without stenosis or other abnormality. A1 segments patent bilaterally. Right A1 hypoplastic, accounting for the slightly diminutive right ICA is compared to the left. Tiny 1-2 mm outpouching arising from the mid aspect of the anterior communicating artery complex suspicious for a small aneurysm (series 5, image 110). Anterior cerebral arteries patent to their distal aspects without stenosis. No M1 stenosis or occlusion. Normal MCA bifurcations. Distal MCA branches well perfused and symmetric. Posterior circulation: Both V4 segments widely patent to the vertebrobasilar junction without stenosis. Left vertebral artery slightly dominant. Both PICA origins patent and normal. Basilar widely patent to its distal aspect without stenosis. Superior cerebellar arteries patent bilaterally. Both PCAs primarily supplied via the basilar well perfused to their distal aspects without stenosis. Anatomic variants: Hypoplastic right A1 segment. Other: None. IMPRESSION: 1. Negative intracranial MRA with essentially normal appearance of the medium and large vessels of the intracranial circulation. No large vessel occlusion. No hemodynamically significant or correctable stenosis. 2. 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 Electronically Signed   By: Jeannine Boga M.D.   On: 02/02/2021 05:26   MR BRAIN WO CONTRAST  Result Date: 02/01/2021 CLINICAL DATA:  Dizziness EXAM: MRI HEAD WITHOUT CONTRAST TECHNIQUE: Multiplanar, multiecho pulse sequences of the brain and surrounding structures  were obtained without intravenous contrast. COMPARISON:  10/05/2020 FINDINGS: Brain: There is no acute infarction or intracranial hemorrhage. A small ill-defined area of diffusion hyperintensity and ADC isointensity is present in the left centrum semiovale. There is no intracranial mass or mass effect. There is no hydrocephalus or extra-axial fluid collection. Prominence of the ventricles and sulci reflects mild generalized parenchymal volume loss. Scattered punctate foci of susceptibility hypointensity likely reflecting chronic microhemorrhages. Patchy and confluent areas of T2 hyperintensity in the supratentorial white matter are nonspecific but probably reflect moderate chronic microvascular ischemic changes. There are chronic left frontal infarcts. Small chronic right parietooccipital infarct. Tiny chronic right cerebellar infarct. Vascular: Major vessel flow voids at the skull base are preserved. Skull and upper cervical spine: Normal marrow signal is preserved. Sinuses/Orbits: Paranasal sinuses are aerated. Orbits are unremarkable. Other: Sella is unremarkable.  Mastoid air cells are clear. IMPRESSION: Probable small subcentimeter subacute left cerebral white matter infarct. Stable chronic microvascular ischemic changes and chronic infarcts. Similar burden of scattered chronic microhemorrhages reflecting sequelae of chronic hypertension and/or amyloid angiopathy. Electronically Signed  By: Macy Mis M.D.   On: 02/01/2021 18:11   DG Chest Portable 1 View  Result Date: 02/01/2021 CLINICAL DATA:  Chest pain since yesterday EXAM: PORTABLE CHEST 1 VIEW COMPARISON:  01/06/2021 FINDINGS: Prior median sternotomy. Lungs are adequately inflated and otherwise clear. Borderline stable cardiomegaly. Remainder of the exam is unchanged. IMPRESSION: No acute cardiopulmonary disease. Electronically Signed   By: Marin Olp M.D.   On: 02/01/2021 13:59        Scheduled Meds:  amiodarone  200 mg Oral Daily    apixaban  5 mg Oral BID   vitamin C with rose hips  1,000 mg Oral Daily   calcitRIOL  0.25 mcg Oral QODAY   insulin aspart  0-9 Units Subcutaneous Q6H   pantoprazole  40 mg Oral Daily   traZODone  100 mg Oral QHS   Continuous Infusions:  sodium chloride 75 mL/hr at 02/02/21 0356     LOS: 1 day        Hosie Poisson, MD [facility]   To contact the attending provider between 7A-7P or the covering provider during after hours 7P-7A, please log into the web site [email or web address] and access using universal [place] password for that web site. If you do not have the password, please call the hospital operator.  02/02/2021, 11:23 AM

## 2021-02-02 NOTE — Progress Notes (Signed)
STROKE TEAM PROGRESS NOTE   INTERVAL HISTORY  Presented with right sided weakness for several days.  He was found to have a small subcentimeter subacute L white matter stroke.  His neighbor noticed that he was dragging his right foot while walking.  The patient has been experiencing excessive fatigue, brain fog, memory lapses, and decrease in appetite since he was diagnosed with covid in October 2021.  He lost his sense of smell and taste.  Had constipation and was prescribed stool softners and then ended up with diarrhea.  He has lost approximately 20lbs in the last few months.  He has had a difficult time working since he was first diagnosed with covid.  He has seen his PCP, neurologist, and endocrinologist.     Vitals:   02/02/21 0132 02/02/21 0408 02/02/21 0718 02/02/21 1138  BP: 120/72 124/65 111/65 105/68  Pulse: (!) 57 62 (!) 50 (!) 57  Resp: 17 17 18 16   Temp: 98.4 F (36.9 C) 98.3 F (36.8 C) 97.6 F (36.4 C) (!) 97.4 F (36.3 C)  TempSrc: Oral Oral Oral Oral  SpO2: 100% 99% 100% 100%  Weight:      Height:       CBC:  Recent Labs  Lab 02/01/21 1211 02/01/21 1233 02/02/21 0214  WBC 7.8  --  7.2  NEUTROABS 5.7  --   --   HGB 11.6* 12.6* 11.9*  HCT 35.3* 37.0* 35.4*  MCV 102.9*  --  101.4*  PLT 168  --  419   Basic Metabolic Panel:  Recent Labs  Lab 02/01/21 1211 02/01/21 1233 02/02/21 0214  NA 135 135 138  K 3.9 3.8 4.0  CL 101 101 103  CO2 25  --  26  GLUCOSE 153* 151* 96  BUN 29* 32* 31*  CREATININE 3.10* 3.20* 2.88*  CALCIUM 9.3  --  9.3   Lipid Panel:  Recent Labs  Lab 02/02/21 0214  CHOL 115  TRIG 94  HDL 46  CHOLHDL 2.5  VLDL 19  LDLCALC 50   HgbA1c: No results for input(s): HGBA1C in the last 168 hours. Urine Drug Screen: No results for input(s): LABOPIA, COCAINSCRNUR, LABBENZ, AMPHETMU, THCU, LABBARB in the last 168 hours.  Alcohol Level No results for input(s): ETH in the last 168 hours.  IMAGING past 24 hours  MR ANGIO HEAD WO  CONTRAST Result Date: 02/02/2021 IMPRESSION:  1. Negative intracranial MRA with essentially normal appearance of the medium and large vessels of the intracranial circulation. No large vessel occlusion. No hemodynamically significant or correctable stenosis.  2. 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   CT head Result date: 02/01/2021 IMPRESSION: 1. No acute intracranial abnormality. 2. Stable mild atrophy and moderate chronic microvascular ischemic changes.  MRI Brain Result date 02/01/2021 IMPRESSION: Probable small subcentimeter subacute left cerebral white matter infarct.   Stable chronic microvascular ischemic changes and chronic infarcts. Similar burden of scattered chronic microhemorrhages reflecting sequelae of chronic hypertension and/or amyloid angiopathy.  PHYSICAL EXAM General: Laying comfortably in bed; in no acute distress. HENT: Normal oropharynx and mucosa.  Neck: Supple, no pain or tenderness CV: No JVD. No peripheral edema. Pulmonary: Symmetric Chest rise. Normal respiratory effort. Abdomen: Soft to touch, non-tender. Ext: No cyanosis, edema, or deformity  Skin: No rash. Normal palpation of skin.   Musculoskeletal: Normal digits and nails by inspection. No clubbing.   Neurologic Examination  Mental status/Cognition: Alert, oriented to self, place, month and  year, good attention. Speech/language: Fluent, comprehension intact, object naming intact, repetition intact. Cranial nerves:   CN II Pupils equal and reactive to light, no VF deficits   CN III,IV,VI EOM intact, no gaze preference or deviation, no nystagmus   CN V normal sensation in V1, V2, and V3 segments bilaterally   CN VII no asymmetry, no nasolabial fold flattening   CN VIII normal hearing to speech   CN IX & X normal palatal elevation, no uvular deviation   CN XI 5/5 head turn and 5/5 shoulder shrug bilaterally   CN XII midline tongue  protrusion   Motor: RUE 4+/5 RLE 3/5, LUE 5/5 LL 5/5.   Coordination/Complex Motor: - Finger to Nose intact BL - Heel to shin unable to do due to weakness in RLE. - Rapid alternating movement are slowed in RUE - Gait: deferred  ASSESSMENT/PLAN [name] is a 75 y.o. male with history of anxiety, afibb on eliquis, CAD, DM2, Depression, CHF, HLD, TIA, mitral regurgitation s/p repair, Osteoarthritis, PTSD, OSA, COVID 19 a month ago who presents with R sided weakness x 3 days and found to have a small subcentimeter subacute L white matter stroke.  Stroke:  subacute left cerebral white matter infarct. CT head No acute intracranial abnormality.  Chronic microvascular ischemic changes MRI  subacute left cerebral white matter infarct. MRA  No large vessel occlusion Carotid Doppler  pending 2D Echo (10/05/20): EF 45-50%, left ventricular hypertrophy, moderate mitral stenosis. There is moderate calcification of the aortic valve. moderate thickening of the aortic valve LDL 50 HgbA1c 6.3 VTE prophylaxis - SCDs, Eliquis Diet: heart healthy Eliquis (apixaban) daily prior to admission, now on Eliquis (apixaban) daily.  Therapy recommendations:  Pending Disposition:  Pending  Hypertension Home meds:  none Stable Permissive hypertension (OK if < 220/120) but gradually normalize in 5-7 days Long-term BP goal normotensive  Atrial Fibrillation Amiodarone 200mg  daily Eliquis 5mg  bid  Hyperlipidemia Home meds:  none LDL 50, goal < 70  Diabetes type II Controlled Home meds:  none HgbA1c 6.3, goal < 7.0 CBGs Recent Labs    02/01/21 2358 02/02/21 0612 02/02/21 1137  GLUCAP 148* 134* 127*    SSI  Other Stroke Risk Factors Advanced Age >/= 54  Hx stroke Family hx stroke ([name]) Coronary artery disease: MI, s/p CABG, sp PCI Obstructive sleep apnea, on CPAP at home Diastolic congestive heart failure  Other Active Problems 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 Will need outpatient follow-up Depression/PTSD: trazodone 100mg  nightly Right foot drop:ankle foot orthosis ordered by [facility] day # 1  Lissy Olivencia-Simmons, ACNP-BC Stroke NP  To contact Stroke Continuity provider, please refer to [email or web address] After hours, contact General Neurology

## 2021-02-03 ENCOUNTER — Inpatient Hospital Stay (HOSPITAL_COMMUNITY): Payer: No Typology Code available for payment source

## 2021-02-03 DIAGNOSIS — I6330 Cerebral infarction due to thrombosis of unspecified cerebral artery: Secondary | ICD-10-CM

## 2021-02-03 LAB — GLUCOSE, CAPILLARY
Glucose-Capillary: 119 mg/dL — ABNORMAL HIGH (ref 70–99)
Glucose-Capillary: 129 mg/dL — ABNORMAL HIGH (ref 70–99)
Glucose-Capillary: 167 mg/dL — ABNORMAL HIGH (ref 70–99)
Glucose-Capillary: 193 mg/dL — ABNORMAL HIGH (ref 70–99)

## 2021-02-03 LAB — HEMOGLOBIN A1C
Hgb A1c MFr Bld: 6.2 % — ABNORMAL HIGH (ref 4.8–5.6)
Mean Plasma Glucose: 131 mg/dL

## 2021-02-03 MED ORDER — PROSOURCE PLUS PO LIQD
30.0000 mL | Freq: Two times a day (BID) | ORAL | Status: DC
  Administered 2021-02-03 – 2021-02-04 (×2): 30 mL via ORAL
  Filled 2021-02-03 (×2): qty 30

## 2021-02-03 MED ORDER — ENSURE MAX PROTEIN PO LIQD
11.0000 [oz_av] | Freq: Two times a day (BID) | ORAL | Status: DC
  Administered 2021-02-03: 11 [oz_av] via ORAL
  Filled 2021-02-03 (×5): qty 330

## 2021-02-03 MED ORDER — GLUCERNA SHAKE PO LIQD
237.0000 mL | Freq: Three times a day (TID) | ORAL | Status: DC
  Administered 2021-02-03 – 2021-02-04 (×3): 237 mL via ORAL

## 2021-02-03 MED ORDER — RENA-VITE PO TABS
1.0000 | ORAL_TABLET | Freq: Every day | ORAL | Status: DC
  Administered 2021-02-03: 1 via ORAL
  Filled 2021-02-03: qty 1

## 2021-02-03 MED ORDER — ASPIRIN EC 81 MG PO TBEC
81.0000 mg | DELAYED_RELEASE_TABLET | Freq: Every day | ORAL | Status: DC
  Administered 2021-02-03 – 2021-02-04 (×2): 81 mg via ORAL
  Filled 2021-02-03 (×2): qty 1

## 2021-02-03 NOTE — Progress Notes (Signed)
STROKE TEAM PROGRESS NOTE   INTERVAL HISTORY Wife at the bedside. Pt lying in bed, still complaining of right foot drop. He denies any right arm weakness, or right sided numbness. On exam, he seems also have mild right leg proximal weakness, but more prominent at right foot drop.  PT/OT recommend outpt PT/OT.   Vitals:   02/02/21 2337 02/03/21 0317 02/03/21 0803 02/03/21 1300  BP: 121/69 114/65 139/66 126/65  Pulse: (!) 58 (!) 55 62 62  Resp: 16 17  18   Temp: 98.5 F (36.9 C) 97.9 F (36.6 C) 97.6 F (36.4 C) 98.4 F (36.9 C)  TempSrc: Oral Oral Oral Oral  SpO2: 100% 99% 100% 100%  Weight:      Height:       CBC:  Recent Labs  Lab 02/01/21 1211 02/01/21 1233 02/02/21 0214  WBC 7.8  --  7.2  NEUTROABS 5.7  --   --   HGB 11.6* 12.6* 11.9*  HCT 35.3* 37.0* 35.4*  MCV 102.9*  --  101.4*  PLT 168  --  683   Basic Metabolic Panel:  Recent Labs  Lab 02/01/21 1211 02/01/21 1233 02/02/21 0214  NA 135 135 138  K 3.9 3.8 4.0  CL 101 101 103  CO2 25  --  26  GLUCOSE 153* 151* 96  BUN 29* 32* 31*  CREATININE 3.10* 3.20* 2.88*  CALCIUM 9.3  --  9.3   Lipid Panel:  Recent Labs  Lab 02/02/21 0214  CHOL 115  TRIG 94  HDL 46  CHOLHDL 2.5  VLDL 19  LDLCALC 50   HgbA1c:  Recent Labs  Lab 02/02/21 0214  HGBA1C 6.2*   Urine Drug Screen: No results for input(s): LABOPIA, COCAINSCRNUR, LABBENZ, AMPHETMU, THCU, LABBARB in the last 168 hours.  Alcohol Level No results for input(s): ETH in the last 168 hours.  IMAGING past 24 hours  MR ANGIO HEAD WO CONTRAST Result Date: 02/02/2021 IMPRESSION:  1. Negative intracranial MRA with essentially normal appearance of the medium and large vessels of the intracranial circulation. No large vessel occlusion. No hemodynamically significant or correctable stenosis.  2. 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   CT head Result date: 02/01/2021 IMPRESSION: 1. No acute  intracranial abnormality. 2. Stable mild atrophy and moderate chronic microvascular ischemic changes.  MRI Brain Result date 02/01/2021 IMPRESSION: Probable small subcentimeter subacute left cerebral white matter infarct.   Stable chronic microvascular ischemic changes and chronic infarcts. Similar burden of scattered chronic microhemorrhages reflecting sequelae of chronic hypertension and/or amyloid angiopathy.  PHYSICAL EXAM General: Laying comfortably in bed; in no acute distress. HENT: Normal oropharynx and mucosa.  Neck: Supple, no pain or tenderness CV: No JVD. No peripheral edema. Pulmonary: Symmetric Chest rise. Normal respiratory effort. Abdomen: Soft to touch, non-tender. Ext: No cyanosis, edema, or deformity  Skin: No rash. Normal palpation of skin.   Musculoskeletal: Normal digits and nails by inspection. No clubbing.   Neurologic Examination  Mental status/Cognition: Alert, oriented to self, place, month and year, good attention. Speech/language: Fluent, comprehension intact, object naming intact, repetition intact. Cranial nerves:   CN II Pupils equal and reactive to light, no VF deficits   CN III,IV,VI EOM intact, no gaze preference or deviation, no nystagmus   CN V normal sensation in V1, V2, and V3 segments bilaterally   CN VII no asymmetry, no nasolabial fold flattening   CN VIII normal hearing to speech   CN  IX & X normal palatal elevation, no uvular deviation   CN XI 5/5 head turn and 5/5 shoulder shrug bilaterally   CN XII midline tongue protrusion   Motor: BUE 5/5, LLE 5/5, RLE proximal psoas 4/5 with slow drift, knee extension 5/5, ankle DF 2/5, PF 5/5.  Coordination/Complex Motor: - Finger to Nose intact BL - Heel to shin slow in RLE.  Sensation - decreased light touch at right LE laterally   Gait: deferred  ASSESSMENT/PLAN [name] is a 75 y.o. male with history of anxiety, afibb on eliquis, CAD, DM2, Depression, CHF, HLD, TIA,  mitral regurgitation s/p repair, Osteoarthritis, PTSD, OSA, COVID 19 a month ago who presents with R sided weakness x 3 days and found to have a small subcentimeter subacute L white matter stroke.  Possible stroke:  subacute left SO infarct, likely small vessel disease CT head No acute intracranial abnormality.  Chronic microvascular ischemic changes MRI  subacute left cerebral white matter infarct. MRA  No large vessel occlusion Carotid Doppler unremarkable  2D Echo (10/05/20): EF 45-50% LDL 50 HgbA1c 6.3 VTE prophylaxis - SCDs, Eliquis Eliquis (apixaban) daily prior to admission, now on Eliquis (apixaban) daily. Recommend to add ASA 81 daily on top of eliquis  Therapy recommendations:  outpt PT/OT Disposition:  home today  ? Right peroneal nerve injury Prominent right foot drop Continue PT/OT Will follow up with Dr. Leta Baptist closely to repeat EMG/NCS  Hypertension Home meds:  none Stable Long-term BP goal normotensive  Atrial Fibrillation Amiodarone 200mg  daily Eliquis 5mg  bid Continue eliquis on discharge  Hyperlipidemia Home meds:  none LDL 50, at goal of < 70 No statin needed given LDL at goal  Diabetes type II Controlled Home meds:  none HgbA1c 6.3, goal < 7.0 CBGs SSI  Other Stroke Risk Factors Advanced Age >/= 43  Hx stroke Family hx stroke (sister) Coronary artery disease: MI, s/p CABG, sp PCI Obstructive sleep apnea, on CPAP at home Diastolic congestive heart failure  Other Active Problems 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 Will need outpatient follow-up Depression/PTSD: trazodone 100mg  nightly Right foot drop:ankle foot orthosis ordered by [facility] day # 2  Neurology will sign off. Please call with questions. Pt will follow up with Dr. Leta Baptist at [facility] in about 4 weeks. Thanks for the consult.   Rosalin Hawking, MD PhD Stroke Neurology 02/03/2021 5:11 PM  To contact  Stroke Continuity provider, please refer to [email or web address] After hours, contact General Neurology

## 2021-02-03 NOTE — Progress Notes (Signed)
Initial Nutrition Assessment  DOCUMENTATION CODES:  Severe malnutrition in context of acute illness/injury  INTERVENTION:  Add Glucerna Shake po TID, each supplement provides 220 kcal and 10 grams of protein.  Add Ensure Max po BID, each supplement provides 150 kcal and 30 grams of protein.    Add 30 ml ProSource Plus po BID, each supplement provides 100 kcal and 15 grams of protein.    Add Rena-Vite daily.  NUTRITION DIAGNOSIS:  Severe Malnutrition related to acute illness as evidenced by energy intake < or equal to 50% for > or equal to 5 days, percent weight loss, moderate fat depletion, severe fat depletion, mild muscle depletion, moderate muscle depletion.  GOAL:  Patient will meet greater than or equal to 90% of their needs  MONITOR:  PO intake, Supplement acceptance, Labs, Weight trends, I & O's  REASON FOR ASSESSMENT:  Consult, Malnutrition Screening Tool Assessment of nutrition requirement/status  ASSESSMENT:  75 yo male with a PMH of A-fib on chronic anticoagulation, Q6PY, chronic systolic HF, CKD stage 4, essential HTN, CAD s/p CABG, and recent COVID-19 infection who presents with R sided weakness d/t cerebral thrombosis with cerebral infarction.  Spoke with pt and wife at bedside. Pt reports that he has lost his taste and sense of smell since getting COVID in the beginning of May. He has been eating a little here and there, but nothing substantial. His wife also had COVID and she lost her taste/smell as well. He reports that it has started to come back and he has been able to eat the hospital food pretty well.  He reports a 20-25 lb weight loss since 12/22/20. Per Epic, pt has lost ~17 lbs (9.3%) since 12/04/20, which is significant and severe for the time frame.  Pt has mild-moderate depletions throughout his body.  Given above information, pt is severely malnourished in the acute setting.  Recommend adding Glucerna shakes TID, Ensure Max BID, and ProSource BID to  promote intake, as well as Rena-Vite daily.  Discussed multivitamins for pt to take at home, as well as oral nutrition supplements to help regain muscle mass.  Medications: reviewed; Vitamin C, SSI, Protonix  Labs: reviewed; CBG 119-167 HbA1c: 6.2% (01/2021)  NUTRITION - FOCUSED PHYSICAL EXAM: Flowsheet Row Most Recent Value  Orbital Region Moderate depletion  Upper Arm Region Severe depletion  Thoracic and Lumbar Region No depletion  Buccal Region Moderate depletion  Temple Region Moderate depletion  Clavicle Bone Region Moderate depletion  Clavicle and Acromion Bone Region Mild depletion  Scapular Bone Region Mild depletion  Dorsal Hand Moderate depletion  Patellar Region Mild depletion  Anterior Thigh Region Mild depletion  Posterior Calf Region Mild depletion  Edema (RD Assessment) None  Hair Reviewed  Eyes Reviewed  Mouth Reviewed  Skin Reviewed  Nails Reviewed   Diet Order:   Diet Order             Diet heart healthy/carb modified Room service appropriate? Yes; Fluid consistency: Thin  Diet effective now                  EDUCATION NEEDS:  Education needs have been addressed  Skin:  Skin Assessment: Reviewed RN Assessment  Last BM:  02/01/21  Height:  Ht Readings from Last 1 Encounters:  02/01/21 5' 10.5" (1.791 m)   Weight:  Wt Readings from Last 1 Encounters:  02/01/21 77.1 kg   Ideal Body Weight:  76.8 kg  BMI:  Body mass index is 24.05 kg/m.  Estimated Nutritional  Needs:  Kcal:  1950-2150 Protein:  95-110 grams Fluid:  >2 L  Derrel Nip, RD, LDN Registered Dietitian I After-Hours/Weekend Pager # in [phone]

## 2021-02-03 NOTE — Progress Notes (Addendum)
[facility] PROGRESS NOTE    Progress Note  [name]  OHY:[id number] DOB: 22-Sep-1945 DOA: 02/01/2021 PCP: [name], MD     Brief Narrative:   [name] is an 75 y.o. male past medical history of atrial fibrillation on chronic anticoagulation Eliquis, type 2 diabetes mellitus, chronic systolic heart failure stage IV chronic kidney disease essential hypertension, CAD with a history of CABG, with a recent COVID-19 infection presented with right-sided weakness    Assessment/Plan:   Right sided weakness due to  Cerebral thrombosis with cerebral infarction: Brain showed probable small subcentimeter left cerebral white matter infarct with chronic microvascular changes and chronic infarcts.  Probably due to chronic uncontrolled hypertension. Neurology was consulted recommended to continue Eliquis, statins and blood pressure control. Occupational Therapy recommended home health supervision, awaiting physical therapy evaluation. Awaiting with neurology further recommendations.  Chronic atrial fibrillation: Rate controlled on amiodarone, continue Eliquis.  Hyperlipidemia: LDL 50, Continue statins.   Macrocytic anemia: Further evaluation as an outpatient, B12 and folate are within normal limits.   Stage IV chronic kidney disease: chronically creatinine at baseline.  Diabetes mellitus type 2: Glucose fairly controlled requiring minimal insulin. A1c is pending.   DVT prophylaxis: lovenox Family Communication:none Status is: Inpatient  Remains inpatient appropriate because:Hemodynamically unstable  Dispo: The patient is from: Home              Anticipated d/c is to: Home              Patient currently is not medically stable to d/c.   Difficult to place patient No        Code Status:     Code Status Orders  (From admission, onward)           Start     Ordered   02/02/21 0147  Full code  Continuous        02/02/21 0148            Code Status History     Date Active Date Inactive Code Status Order ID Comments User Context   01/06/2021 2319 01/10/2021 1656 Full Code [id number]  Rise Patience, MD Inpatient   10/04/2020 2207 10/05/2020 2158 Full Code [id number]  Elwyn Reach, MD Inpatient   04/29/2017 0937 04/29/2017 1513 Full Code [id number]  [name], MD Inpatient   03/25/2016 1602 04/01/2016 1458 Full Code [id number]  [name], MD Inpatient   01/23/2016 1357 01/23/2016 2136 Full Code [id number]  [name], MD Inpatient   04/27/2012 1139 04/30/2012 1435 Full Code [id number]  Sharlett Iles, RN Inpatient      Advance Directive Documentation    Flowsheet Row Most Recent Value  Type of Advance Directive Living will  Pre-existing out of facility DNR order (yellow form or pink MOST form) --  "MOST" Form in Place? --         IV Access:   Peripheral IV   Procedures and diagnostic studies:   CT HEAD WO CONTRAST  Result Date: 02/01/2021 CLINICAL DATA:  Right leg weakness for the past 4 days. History of stroke. EXAM: CT HEAD WITHOUT CONTRAST TECHNIQUE: Contiguous axial images were obtained from the base of the skull through the vertex without intravenous contrast. COMPARISON:  CT head dated Jan 06, 2021. FINDINGS: Brain: No evidence of acute infarction, hemorrhage, hydrocephalus, extra-axial collection or mass lesion/mass effect. Stable mild atrophy and moderate chronic microvascular ischemic changes. Vascular: Calcified atherosclerosis at the skullbase. No hyperdense vessel. Skull: Normal.  Negative for fracture or focal lesion. Sinuses/Orbits: No acute finding. Other: None. IMPRESSION: 1. No acute intracranial abnormality. 2. Stable mild atrophy and moderate chronic microvascular ischemic changes. Electronically Signed   By: Titus Dubin M.D.   On: 02/01/2021 12:52   MR ANGIO HEAD WO CONTRAST  Result Date: 02/02/2021 CLINICAL DATA:  Initial evaluation for neuro deficit, right-sided  weakness, small stroke on prior brain MRI. EXAM: MRA HEAD WITHOUT CONTRAST TECHNIQUE: Angiographic images of the Circle of Willis were acquired using MRA technique without intravenous contrast. COMPARISON:  Comparison made with previous brain MRI from 02/01/2021. FINDINGS: Anterior circulation: Visualized distal cervical segments of the internal carotid arteries are widely patent with antegrade flow. Petrous, cavernous, and supraclinoid segments widely patent without stenosis or other abnormality. A1 segments patent bilaterally. Right A1 hypoplastic, accounting for the slightly diminutive right ICA is compared to the left. Tiny 1-2 mm outpouching arising from the mid aspect of the anterior communicating artery complex suspicious for a small aneurysm (series 5, image 110). Anterior cerebral arteries patent to their distal aspects without stenosis. No M1 stenosis or occlusion. Normal MCA bifurcations. Distal MCA branches well perfused and symmetric. Posterior circulation: Both V4 segments widely patent to the vertebrobasilar junction without stenosis. Left vertebral artery slightly dominant. Both PICA origins patent and normal. Basilar widely patent to its distal aspect without stenosis. Superior cerebellar arteries patent bilaterally. Both PCAs primarily supplied via the basilar well perfused to their distal aspects without stenosis. Anatomic variants: Hypoplastic right A1 segment. Other: None. IMPRESSION: 1. Negative intracranial MRA with essentially normal appearance of the medium and large vessels of the intracranial circulation. No large vessel occlusion. No hemodynamically significant or correctable stenosis. 2. 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 Electronically Signed   By: Jeannine Boga M.D.   On: 02/02/2021 05:26   MR BRAIN WO CONTRAST  Result Date: 02/01/2021 CLINICAL DATA:  Dizziness EXAM: MRI HEAD WITHOUT CONTRAST TECHNIQUE:  Multiplanar, multiecho pulse sequences of the brain and surrounding structures were obtained without intravenous contrast. COMPARISON:  10/05/2020 FINDINGS: Brain: There is no acute infarction or intracranial hemorrhage. A small ill-defined area of diffusion hyperintensity and ADC isointensity is present in the left centrum semiovale. There is no intracranial mass or mass effect. There is no hydrocephalus or extra-axial fluid collection. Prominence of the ventricles and sulci reflects mild generalized parenchymal volume loss. Scattered punctate foci of susceptibility hypointensity likely reflecting chronic microhemorrhages. Patchy and confluent areas of T2 hyperintensity in the supratentorial white matter are nonspecific but probably reflect moderate chronic microvascular ischemic changes. There are chronic left frontal infarcts. Small chronic right parietooccipital infarct. Tiny chronic right cerebellar infarct. Vascular: Major vessel flow voids at the skull base are preserved. Skull and upper cervical spine: Normal marrow signal is preserved. Sinuses/Orbits: Paranasal sinuses are aerated. Orbits are unremarkable. Other: Sella is unremarkable.  Mastoid air cells are clear. IMPRESSION: Probable small subcentimeter subacute left cerebral white matter infarct. Stable chronic microvascular ischemic changes and chronic infarcts. Similar burden of scattered chronic microhemorrhages reflecting sequelae of chronic hypertension and/or amyloid angiopathy. Electronically Signed   By: Macy Mis M.D.   On: 02/01/2021 18:11   DG Chest Portable 1 View  Result Date: 02/01/2021 CLINICAL DATA:  Chest pain since yesterday EXAM: PORTABLE CHEST 1 VIEW COMPARISON:  01/06/2021 FINDINGS: Prior median sternotomy. Lungs are adequately inflated and otherwise clear. Borderline stable cardiomegaly. Remainder of the exam is unchanged. IMPRESSION: No acute cardiopulmonary disease. Electronically Signed  By: Marin Olp M.D.   On:  02/01/2021 13:59     Medical Consultants:   None.   Subjective:    Tkai Serfass Aken no complaints  Objective:    Vitals:   02/02/21 1138 02/02/21 2017 02/02/21 2337 02/03/21 0317  BP: 105/68 123/69 121/69 114/65  Pulse: (!) 57 (!) 58 (!) 58 (!) 55  Resp: 16 16 16 17   Temp: (!) 97.4 F (36.3 C) 99 F (37.2 C) 98.5 F (36.9 C) 97.9 F (36.6 C)  TempSrc: Oral Oral Oral Oral  SpO2: 100% 100% 100% 99%  Weight:      Height:       SpO2: 99 %   Intake/Output Summary (Last 24 hours) at 02/03/2021 0709 Last data filed at 02/02/2021 1238 Gross per 24 hour  Intake 360 ml  Output 400 ml  Net -40 ml   Filed Weights   02/01/21 1206  Weight: 77.1 kg    Exam: General exam: In no acute distress. Respiratory system: Good air movement and clear to auscultation. Cardiovascular system: S1 & S2 heard, RRR. No JVD. Gastrointestinal system: Abdomen is nondistended, soft and nontender.   Extremities: No pedal edema. Skin: No rashes, lesions or ulcers Psychiatry: Judgement and insight appear normal. Mood & affect appropriate.    Data Reviewed:    Labs: Basic Metabolic Panel: Recent Labs  Lab 02/01/21 1211 02/01/21 1233 02/02/21 0214  NA 135 135 138  K 3.9 3.8 4.0  CL 101 101 103  CO2 25  --  26  GLUCOSE 153* 151* 96  BUN 29* 32* 31*  CREATININE 3.10* 3.20* 2.88*  CALCIUM 9.3  --  9.3   GFR Estimated Creatinine Clearance: 23.3 mL/min (A) (by C-G formula based on SCr of 2.88 mg/dL (H)). Liver Function Tests: Recent Labs  Lab 02/01/21 1211 02/02/21 0214  AST 28 27  ALT 35 32  ALKPHOS 116 95  BILITOT 0.8 0.8  PROT 5.6* 5.1*  ALBUMIN 3.3* 3.1*   No results for input(s): LIPASE, AMYLASE in the last 168 hours. No results for input(s): AMMONIA in the last 168 hours. Coagulation profile Recent Labs  Lab 02/01/21 1211  INR 1.6*   COVID-19 Labs  No results for input(s): DDIMER, FERRITIN, LDH, CRP in the last 72 hours.  Lab Results  Component Value Date    SARSCOV2NAA NEGATIVE 02/02/2021   SARSCOV2NAA NEGATIVE 01/06/2021   SARSCOV2NAA NEGATIVE 10/04/2020   [place] NEGATIVE 10/16/2019    CBC: Recent Labs  Lab 02/01/21 1211 02/01/21 1233 02/02/21 0214  WBC 7.8  --  7.2  NEUTROABS 5.7  --   --   HGB 11.6* 12.6* 11.9*  HCT 35.3* 37.0* 35.4*  MCV 102.9*  --  101.4*  PLT 168  --  167   Cardiac Enzymes: No results for input(s): CKTOTAL, CKMB, CKMBINDEX, TROPONINI in the last 168 hours. BNP (last 3 results) No results for input(s): PROBNP in the last 8760 hours. CBG: Recent Labs  Lab 02/02/21 1137 02/02/21 1809 02/02/21 2329 02/02/21 2342 02/03/21 0540  GLUCAP 127* 130* 147* 150* 119*   D-Dimer: No results for input(s): DDIMER in the last 72 hours. Hgb A1c: No results for input(s): HGBA1C in the last 72 hours. Lipid Profile: Recent Labs    02/02/21 0214  CHOL 115  HDL 46  LDLCALC 50  TRIG 94  CHOLHDL 2.5   Thyroid function studies: Recent Labs    02/02/21 0214  TSH 2.330   Anemia work up: Recent [employer]  02/02/21 0214  VITAMINB12 630  FOLATE 8.6   Sepsis Labs: Recent Labs  Lab 02/01/21 1211 02/02/21 0214  WBC 7.8 7.2   Microbiology Recent Results (from the past 240 hour(s))  Resp Panel by RT-PCR (Flu A&B, Covid)     Status: None   Collection Time: 02/02/21  2:46 AM  Result Value Ref Range Status   SARS Coronavirus 2 by RT PCR NEGATIVE NEGATIVE Final    Comment: (NOTE) SARS-CoV-2 target nucleic acids are NOT DETECTED.  The SARS-CoV-2 RNA is generally detectable in upper respiratory specimens during the acute phase of infection. The lowest concentration of SARS-CoV-2 viral copies this assay can detect is 138 copies/mL. A negative result does not preclude SARS-Cov-2 infection and should not be used as the sole basis for treatment or other patient management decisions. A negative result may occur with  improper specimen collection/handling, submission of specimen other than nasopharyngeal swab,  presence of viral mutation(s) within the areas targeted by this assay, and inadequate number of viral copies(<138 copies/mL). A negative result must be combined with clinical observations, patient history, and epidemiological information. The expected result is Negative.  Fact Sheet for Patients:  EntrepreneurPulse.com.au  Fact Sheet for Healthcare Providers:  IncredibleEmployment.be  This test is no t yet approved or cleared by the [place] FDA and  has been authorized for detection and/or diagnosis of SARS-CoV-2 by FDA under an Emergency Use Authorization (EUA). This EUA will remain  in effect (meaning this test can be used) for the duration of the COVID-19 declaration under Section 564(b)(1) of the Act, 21 U.S.C.section 360bbb-3(b)(1), unless the authorization is terminated  or revoked sooner.       Influenza A by PCR NEGATIVE NEGATIVE Final   Influenza B by PCR NEGATIVE NEGATIVE Final    Comment: (NOTE) The Xpert Xpress SARS-CoV-2/FLU/RSV plus assay is intended as an aid in the diagnosis of influenza from Nasopharyngeal swab specimens and should not be used as a sole basis for treatment. Nasal washings and aspirates are unacceptable for Xpert Xpress SARS-CoV-2/FLU/RSV testing.  Fact Sheet for Patients: EntrepreneurPulse.com.au  Fact Sheet for Healthcare Providers: IncredibleEmployment.be  This test is not yet approved or cleared by the [place] FDA and has been authorized for detection and/or diagnosis of SARS-CoV-2 by FDA under an Emergency Use Authorization (EUA). This EUA will remain in effect (meaning this test can be used) for the duration of the COVID-19 declaration under Section 564(b)(1) of the Act, 21 U.S.C. section 360bbb-3(b)(1), unless the authorization is terminated or revoked.  Performed at [facility], [place] [address], [place], [place] [address]      Medications:     amiodarone  200 mg Oral Daily   apixaban  5 mg Oral BID   vitamin C with rose hips  1,000 mg Oral Daily   calcitRIOL  0.25 mcg Oral QODAY   insulin aspart  0-9 Units Subcutaneous Q6H   pantoprazole  40 mg Oral Daily   traZODone  100 mg Oral QHS   Continuous Infusions:  sodium chloride 75 mL/hr at 02/02/21 2351      LOS: 2 days   Charlynne Cousins  [facility]  02/03/2021, 7:09 AM

## 2021-02-03 NOTE — Progress Notes (Signed)
Orthopedic Tech Progress Note Patient Details:  [name] [birth year] [id number]  Went to apply PRAFO BOOT to patient RLE and patient stated with wife at bedside "MD  wanted patient to work with PT before I apply PRAFO, stated patient may not need product.   Patient ID: [name], male   DOB: 03-17-1946, 75 y.o.   MRN: [id number]  Janit Pagan 02/03/2021, 11:47 AM

## 2021-02-03 NOTE — Progress Notes (Signed)
Carotid artery duplex completed. Refer to "CV Proc" under chart review to view preliminary results.  02/03/2021 11:38 AM Kelby Aline., MHA, RVT, RDCS, RDMS

## 2021-02-03 NOTE — Evaluation (Signed)
Physical Therapy Evaluation Patient Details Name: [name] MRN: [id number] DOB: [birth year] Today's Date: 02/03/2021   History of Present Illness  [name]  is a 75 y.o. male, admited with past 3 days having right-sided leg weakness and also mild arm weakness. 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 Patient has been dragging his right foot while walking. Has been having diarrhea for past 2 days. PHMx:  atrial fibrillation, DM2, chronic diastolic CHF, CKD stage IV, hyperlipidemia, HTN, CAD s/p CABG, COVID 19 infection a month ago and also last year.Patient states that since her recent COVID infection he has had ongoing loss of taste and smell, decreased appetite, 20 pound weight loss, brain fog, memory loss, sleep disturbance. He also has history of gout  Clinical Impression  Pt admitted with above diagnosis. Pt presents with active df L ankle in decreased range with 2/5 strength. Recommended to hold off on AFO at this time as hopeful he will get more recovery. Pt independent with bed mobility and transfers. Demonstrated balance deficits with ambulation including occasional cross over step.  Would benefit form outpt neuro PT for balance. Pt currently with functional limitations due to the deficits listed below (see PT Problem List). Pt will benefit from skilled PT to increase their independence and safety with mobility to allow discharge to the venue listed below.       Follow Up Recommendations Outpatient PT;Supervision - Intermittent    Equipment Recommendations  None recommended by PT    Recommendations for Other Services       Precautions / Restrictions Precautions Precautions: Fall Restrictions Weight Bearing Restrictions: No      Mobility  Bed Mobility Overal bed mobility: Independent                  Transfers Overall transfer level: Needs assistance Equipment used: None Transfers: Sit to/from Stand Sit to  Stand: Supervision         General transfer comment: pt stood multiple times from various height surfaces without LOB  Ambulation/Gait Ambulation/Gait assistance: Min guard Gait Distance (Feet): 300 Feet Assistive device: None Gait Pattern/deviations: Staggering left Gait velocity: decreased Gait velocity interpretation: 1.31 - 2.62 ft/sec, indicative of limited community ambulator General Gait Details: pt with 2-3 bouts of crossing R foot over L with L stagger, min A to correct. Pt with decreased awareness of this with vc's to attend.  Stairs Stairs: Yes Stairs assistance: Supervision Stair Management: One rail Right;Alternating pattern;Forwards Number of Stairs: 10 General stair comments: no difficulties with use of rail. Pt able to step up with R foot without catching toe on step  Wheelchair Mobility    Modified Rankin (Stroke Patients Only) Modified Rankin (Stroke Patients Only) Pre-Morbid Rankin Score: No significant disability Modified Rankin: Moderate disability     Balance Overall balance assessment: Mild deficits observed, not formally tested                                           Pertinent Vitals/Pain Pain Assessment: No/denies pain    Home Living Family/patient expects to be discharged to:: Private residence Living Arrangements: Spouse/significant other Available Help at Discharge: Family;Available 24 hours/day Type of Home: House Home Access: Level entry     Home Layout: Multi-level;Able to live on main level with bedroom/bathroom Home Equipment: Shower seat;Walker - 2 wheels;Cane - single point;Bedside commode  Prior Function Level of Independence: Independent         Comments: likes to swing dance, wife was an OR Marine scientist at [employer] for 40 years. Has been mostly sitting since most recent covid infection     Hand Dominance   Dominant Hand: Right    Extremity/Trunk Assessment   Upper Extremity Assessment Upper Extremity  Assessment: Defer to OT evaluation    Lower Extremity Assessment Lower Extremity Assessment: RLE deficits/detail RLE Deficits / Details: ankle df 2-/5, hip flex 4-/5 (compared to 4/5 on L), knee ext 4/5 (very close to L side) RLE Sensation: decreased proprioception RLE Coordination: decreased gross motor    Cervical / Trunk Assessment Cervical / Trunk Assessment: Normal  Communication   Communication: No difficulties  Cognition Arousal/Alertness: Awake/alert Behavior During Therapy: WFL for tasks assessed/performed Overall Cognitive Status: Impaired/Different from baseline Area of Impairment: Safety/judgement                         Safety/Judgement: Decreased awareness of deficits;Decreased awareness of safety     General Comments: pt able to verbalize that he has balance issues but decreased awareness when he cross over steps and decreased agreeability to using RW at night when he gets up to go to the bathroom groggy      General Comments General comments (skin integrity, edema, etc.): discussed fall risk and d/c rec for outpt PT. Pt and wife agreeable. Discussed not using an AFO at this time since he does have active df and to consider >3-6 mos if not improving. Encouraged pt to use RW at home in the night when he gets up to go to the bathroom. Pt hesitant    Exercises Other Exercises Other Exercises: R gastroc stretch x 30 sec with blanket Other Exercises: ankle pumps against gravity and perpendicular to gravity, 10x each   Assessment/Plan    PT Assessment Patient needs continued PT services  PT Problem List Decreased strength;Decreased range of motion;Decreased balance;Decreased cognition;Decreased activity tolerance;Decreased knowledge of use of DME;Decreased knowledge of precautions;Decreased safety awareness       PT Treatment Interventions Gait training;Stair training;Functional mobility training;Therapeutic activities;Therapeutic exercise;Balance  training;Patient/family education    PT Goals (Current goals can be found in the Care Plan section)  Acute Rehab PT Goals Patient Stated Goal: to get a shower, to go home PT Goal Formulation: With patient Time For Goal Achievement: 02/10/21 Potential to Achieve Goals: Good Additional Goals Additional Goal #1: Pt to perform >45/56 on [place] balance    Frequency Min 4X/week   Barriers to discharge        Co-evaluation               AM-PAC PT "6 Clicks" Mobility  Outcome Measure Help needed turning from your back to your side while in a flat bed without using bedrails?: None Help needed moving from lying on your back to sitting on the side of a flat bed without using bedrails?: None Help needed moving to and from a bed to a chair (including a wheelchair)?: None Help needed standing up from a chair using your arms (e.g., wheelchair or bedside chair)?: None Help needed to walk in hospital room?: A Little Help needed climbing 3-5 steps with a railing? : None 6 Click Score: 23    End of Session   Activity Tolerance: Patient tolerated treatment well Patient left: Other (comment);with family/visitor present (in bathroom) Nurse Communication: Mobility status PT Visit Diagnosis: Unsteadiness on feet (R26.81)  Time: 1208-1238 PT Time Calculation (min) (ACUTE ONLY): 30 min   Charges:   PT Evaluation $PT Eval Moderate Complexity: 1 Mod PT Treatments $Gait Training: 8-22 mins        Leighton Roach, [place]  Pager [phone] Office [place] 02/03/2021, 3:03 PM

## 2021-02-03 NOTE — Progress Notes (Signed)
Orthopedic Tech Progress Note Patient Details:  [name] Jan 22, 1946 [id number]  THERAPIST recommended to hold off on PRAFO/AFO at this time." Per clinical impression   Patient ID: LADERRICK WILK, male   DOB: 22-Feb-1946, 75 y.o.   MRN: [id number]  Janit Pagan 02/03/2021, 4:37 PM

## 2021-02-03 NOTE — Progress Notes (Signed)
Cardiac Monitoring service made me aware that when patient's HR goes down to 38, it becomes also like a junctional rhythm but does not sustain. Goes back into Sinus [place].   Lowest HR I was notified of was 31, as of 0108.   Checked on patient and is sleeping peacefully.   Have made night MD aware.

## 2021-02-03 NOTE — TOC Transition Note (Signed)
Transition of Care [facility]) - CM/SW Discharge Note   Patient Details  Name: [name] MRN: [id number] Date of Birth: 1946/04/22  [facility] CM/SW Contact:  Pollie Friar, RN Phone Number: 02/03/2021, 3:48 PM   Clinical Narrative:    Patient discharging home with outpatient therapy. Pt prefers to attend therapy closer to home. Orders faxed to [facility] in [place]. [phone] Pt has supervision at home and transportation to home.     Final next level of care: OP Rehab Barriers to Discharge: No Barriers Identified   Patient Goals and CMS Choice   CMS Medicare.gov Compare Post Acute Care list provided to:: Patient Choice offered to / list presented to : Patient, Spouse  Discharge Placement                       Discharge Plan and Services                                     Social Determinants of Health (SDOH) Interventions     Readmission Risk Interventions No flowsheet data found.

## 2021-02-04 ENCOUNTER — Encounter (HOSPITAL_COMMUNITY): Payer: No Typology Code available for payment source | Admitting: Cardiology

## 2021-02-04 DIAGNOSIS — E43 Unspecified severe protein-calorie malnutrition: Secondary | ICD-10-CM | POA: Insufficient documentation

## 2021-02-04 DIAGNOSIS — M21371 Foot drop, right foot: Secondary | ICD-10-CM

## 2021-02-04 LAB — GLUCOSE, CAPILLARY
Glucose-Capillary: 103 mg/dL — ABNORMAL HIGH (ref 70–99)
Glucose-Capillary: 151 mg/dL — ABNORMAL HIGH (ref 70–99)

## 2021-02-04 MED ORDER — SIMETHICONE 80 MG PO CHEW
80.0000 mg | CHEWABLE_TABLET | Freq: Two times a day (BID) | ORAL | Status: DC | PRN
  Administered 2021-02-04: 80 mg via ORAL
  Filled 2021-02-04 (×2): qty 1

## 2021-02-04 MED ORDER — ASPIRIN 81 MG PO TBEC: 81.0000 mg | DELAYED_RELEASE_TABLET | Freq: Every day | ORAL | 11 refills | Status: DC

## 2021-02-04 NOTE — Progress Notes (Signed)
Physical Therapy Treatment Patient Details Name: [name] MRN: [id number] DOB: 22-Jul-1946 Today's Date: 02/04/2021    History of Present Illness [name]  is a 75 y.o. male, admited with past 3 days having right-sided leg weakness and also mild arm weakness. [id number]; MRI: Probable small subcentimeter subacute left cerebral white matter infarct. Patient has been dragging his right foot while walking. Has been having diarrhea for past 2 days. PHMx:  atrial fibrillation, DM2, chronic diastolic CHF, CKD stage IV, hyperlipidemia, HTN, CAD s/p CABG, COVID 19 infection a month ago and also last year.Patient states that since her recent COVID infection he has had ongoing loss of taste and smell, decreased appetite, 20 pound weight loss, brain fog, memory loss, sleep disturbance. He also has history of gout    PT Comments    Pt with no change in AROM R ankle but functionally had better foot clearance with gait. SPC used today which greatly improved pt's safety. He continued to have a tendency to adduct RLE at times but was aware today and was able to correct before cross over step occurred. Pt ambulated 500' at supervision level with cane. Worked on dynamic balance during gait and discussed activity level for return home. Expect pt will progress well with outpt PT. PT will continue to follow.    Follow Up Recommendations  Outpatient PT;Supervision - Intermittent     Equipment Recommendations  None recommended by PT    Recommendations for Other Services       Precautions / Restrictions Precautions Precautions: Fall Restrictions Weight Bearing Restrictions: No    Mobility  Bed Mobility Overal bed mobility: Independent                  Transfers Overall transfer level: Needs assistance Equipment used: None Transfers: Sit to/from Stand Sit to Stand: Modified independent (Device/Increase time)         General transfer comment: pt waits with initial standing to gain  balance  Ambulation/Gait Ambulation/Gait assistance: Min guard;Supervision Gait Distance (Feet): 500 Feet Assistive device: Straight cane Gait Pattern/deviations: Decreased dorsiflexion - right Gait velocity: decreased Gait velocity interpretation: >2.62 ft/sec, indicative of community ambulatory General Gait Details: pt did much better today with use of cane, could see RLE adducting but with additional support of cane pt able to correct without crossing over. Pt tried cane in R and L hand and both were equally helpful. Better R foot clearance today with gait as well.   Stairs             Wheelchair Mobility    Modified Rankin (Stroke Patients Only) Modified Rankin (Stroke Patients Only) Pre-Morbid Rankin Score: No significant disability Modified Rankin: Moderate disability     Balance Overall balance assessment: Mild deficits observed, not formally tested                                          Cognition Arousal/Alertness: Awake/alert Behavior During Therapy: WFL for tasks assessed/performed Overall Cognitive Status: Impaired/Different from baseline Area of Impairment: Safety/judgement                         Safety/Judgement: Decreased awareness of deficits;Decreased awareness of safety     General Comments: pt more aware of balance suring gait today, aware of tendency to adduct RLE at times and able to correct  Exercises Other Exercises Other Exercises: R gastroc stretch x 30 sec with blanket Other Exercises: perpendicular to gravity, 15x    General Comments General comments (skin integrity, edema, etc.): worked on stops and starts with gait as well as head turns, these did throw pt off balance, cane used to correct.      Pertinent Vitals/Pain Pain Assessment: No/denies pain    Home Living                      Prior Function            PT Goals (current goals can now be found in the care plan section) Acute  Rehab PT Goals Patient Stated Goal: be able to dance again PT Goal Formulation: With patient Time For Goal Achievement: 02/10/21 Potential to Achieve Goals: Good Progress towards PT goals: Progressing toward goals    Frequency    Min 4X/week      PT Plan Current plan remains appropriate    Co-evaluation              AM-PAC PT "6 Clicks" Mobility   Outcome Measure  Help needed turning from your back to your side while in a flat bed without using bedrails?: None Help needed moving from lying on your back to sitting on the side of a flat bed without using bedrails?: None Help needed moving to and from a bed to a chair (including a wheelchair)?: None Help needed standing up from a chair using your arms (e.g., wheelchair or bedside chair)?: None Help needed to walk in hospital room?: A Little Help needed climbing 3-5 steps with a railing? : None 6 Click Score: 23    End of Session Equipment Utilized During Treatment: Gait belt Activity Tolerance: Patient tolerated treatment well Patient left: in bed;with call bell/phone within reach Nurse Communication: Mobility status PT Visit Diagnosis: Unsteadiness on feet (R26.81)     Time: 4650-3546 PT Time Calculation (min) (ACUTE ONLY): 18 min  Charges:  $Gait Training: 8-22 mins                     [place]  Pager [phone] Office [place] 02/04/2021, 10:04 AM

## 2021-02-04 NOTE — Discharge Summary (Signed)
Physician Discharge Summary  [name] JJO:[id number] DOB: Apr 17, 1946 DOA: 02/01/2021  PCP: [name], MD  Admit date: 02/01/2021 Discharge date: 02/04/2021  Admitted From: Home Disposition:  Home  Recommendations for Outpatient Follow-up:  Follow up with PCP in 1-2 weeks Please obtain BMP/CBC in one week Please follow up on the following pending results  Home Health:no Equipment/Devices:None  Discharge Condition:Stable CODE STATUS:Full Diet recommendation: Heart Healthy   Brief/Interim Summary: 75 y.o. male past medical history of atrial fibrillation on chronic anticoagulation Eliquis, type 2 diabetes mellitus, chronic systolic heart failure stage IV chronic kidney disease essential hypertension, CAD with a history of CABG, with a recent COVID-19 infection presented with right-sided weakness    Discharge Diagnoses:  Active Problems:   Right sided weakness   Cerebral thrombosis with cerebral infarction   Protein-calorie malnutrition, severe  Right-sided weakness due to CVA: MRI of the brain showed probable small subcentimeter left cerebral white matter infarct with chronic microvascular changes.  Neurology was consulted recommended to continue aspirin on top of Eliquis.  Therapy and Occupational Therapy evaluated the patient recommended home health PT. He is not on statins due to intolerance.  Chronic atrial fibrillation: Continue amiodarone and Eliquis.  Hyperlipidemia: LDL 50, not on a statin due to intolerance.  Microcytic anemia: Further evaluation as an outpatient, B12 and folate are within normal limits.  Stage IV chronic kidney disease: His creatinine is at baseline fistula is mature.  Diabetes mellitus type 2: Fairly controlled no change made to his medication.  An A1c was 6.2.  Discharge Instructions  Discharge Instructions     Ambulatory referral to Neurology   Complete by: As directed    Follow up with Dr. Leonie Man at [facility] in 2 weeks to repeat  EMG. Pt saw Dr. Leta Baptist recently for EMG. Thanks.   Ambulatory referral to Physical Therapy   Complete by: As directed       Allergies as of 02/04/2021       Reactions   Crestor [rosuvastatin] Other (See Comments)   Muscle aches   Lipitor [atorvastatin] Other (See Comments)   Muscle aches   Pravastatin Other (See Comments)   Muscle aches   Carvedilol    Other reaction(s): loss of appetite   Metformin Diarrhea   Serotonin Reuptake Inhibitors (ssris)    Other reaction(s): Muscle aches   Zocor [simvastatin]    Other reaction(s): Muscle pain   Codeine Itching, Other (See Comments)   Extremity tingling-- can take synthetic        Medication List     TAKE these medications    allopurinol 100 MG tablet Commonly known as: ZYLOPRIM Take 0.5 tablets (50 mg total) by mouth 2 (two) times a week. Please discuss dosing with your PCP based on your renal function.   amiodarone 200 MG tablet Commonly known as: PACERONE Take 1 tablet (200 mg total) by mouth daily.   apixaban 5 MG Tabs tablet Commonly known as: Eliquis Take 1 tablet (5 mg total) by mouth 2 (two) times daily.   aspirin 81 MG EC tablet Take 1 tablet (81 mg total) by mouth daily. Swallow whole.   calcitRIOL 0.25 MCG capsule Commonly known as: ROCALTROL Take 0.25 mcg by mouth every other day.   cetirizine 10 MG tablet Commonly known as: ZYRTEC Take 10 mg by mouth daily.   colchicine 0.6 MG tablet Take 0.6-1.2 mg by mouth 2 (two) times daily as needed (gout).   fluticasone 50 MCG/ACT nasal spray Commonly known as: [place]  1 spray into both nostrils daily as needed for allergies or rhinitis.   HYDROcodone-acetaminophen 5-325 MG tablet Commonly known as: NORCO/VICODIN Take 1 tablet by mouth daily as needed for severe pain.   nitroGLYCERIN 0.4 MG SL tablet Commonly known as: NITROSTAT 1 TAB UNDER THE TONGUE EVERY 5 MINUTES AS NEEDED FOR CHEST PAIN UP TO 3 DOSES, IF PERSIST CALL 911 What changed:   how much to take how to take this when to take this reasons to take this additional instructions   Ozempic (0.25 or 0.5 MG/DOSE) 2 MG/1.5ML Sopn Generic drug: Semaglutide(0.25 or 0.5MG /DOS) Inject 0.375 mLs (0.5 mg total) into the skin every Thursday. What changed: when to take this   pantoprazole 40 MG tablet Commonly known as: PROTONIX Take 40 mg by mouth daily as needed (heartburn).   Praluent 75 MG/ML Soaj Generic drug: Alirocumab Inject 75 mg into the skin every 14 (fourteen) days.   tadalafil 20 MG tablet Commonly known as: CIALIS TAKE 1 TABLET BY MOUTH ONCE DAILY AS NEEDED What changed: reasons to take this   traZODone 100 MG tablet Commonly known as: DESYREL Take 100 mg by mouth at bedtime.   vitamin C with rose hips 1000 MG tablet Take 1,000 mg by mouth daily.        Follow-up [facility] Follow up.   Why: The outpatient rehab will contact you for the first appointment Contact information: [address] [place], [place] [address]  [phone]        Penni Bombard, MD Follow up in 2 week(s).   Specialties: Neurology, Radiology Contact information: [address] [place] [address] [phone]                Allergies  Allergen Reactions   Crestor [Rosuvastatin] Other (See Comments)    Muscle aches    Lipitor [Atorvastatin] Other (See Comments)    Muscle aches   Pravastatin Other (See Comments)    Muscle aches   Carvedilol     Other reaction(s): loss of appetite   Metformin Diarrhea   Serotonin Reuptake Inhibitors (Ssris)     Other reaction(s): Muscle aches   Zocor [Simvastatin]     Other reaction(s): Muscle pain   Codeine Itching and Other (See Comments)    Extremity tingling-- can take synthetic    Consultations: Neurology  Procedures/Studies: CT HEAD WO CONTRAST  Result Date: 02/01/2021 CLINICAL DATA:  Right leg weakness for the past 4 days. History  of stroke. EXAM: CT HEAD WITHOUT CONTRAST TECHNIQUE: Contiguous axial images were obtained from the base of the skull through the vertex without intravenous contrast. COMPARISON:  CT head dated Jan 06, 2021. FINDINGS: Brain: No evidence of acute infarction, hemorrhage, hydrocephalus, extra-axial collection or mass lesion/mass effect. Stable mild atrophy and moderate chronic microvascular ischemic changes. Vascular: Calcified atherosclerosis at the skullbase. No hyperdense vessel. Skull: Normal. Negative for fracture or focal lesion. Sinuses/Orbits: No acute finding. Other: None. IMPRESSION: 1. No acute intracranial abnormality. 2. Stable mild atrophy and moderate chronic microvascular ischemic changes. Electronically Signed   By: Titus Dubin M.D.   On: 02/01/2021 12:52   CT Head Wo Contrast  Result Date: 01/06/2021 CLINICAL DATA:  Increasing falls, initial encounter EXAM: CT HEAD WITHOUT CONTRAST TECHNIQUE: Contiguous axial images were obtained from the base of the skull through the vertex without intravenous contrast. COMPARISON:  10/04/2020 FINDINGS: Brain: No evidence of acute infarction, hemorrhage, hydrocephalus, extra-axial collection or mass  lesion/mass effect. Mild atrophic and ischemic changes are noted. Vascular: No hyperdense vessel or unexpected calcification. Skull: Normal. Negative for fracture or focal lesion. Sinuses/Orbits: No acute finding. Other: None. IMPRESSION: Mild atrophic and ischemic changes stable from the prior exam. No acute abnormality noted. Electronically Signed   By: Inez Catalina M.D.   On: 01/06/2021 16:04   MR ANGIO HEAD WO CONTRAST  Result Date: 02/02/2021 CLINICAL DATA:  Initial evaluation for neuro deficit, right-sided weakness, small stroke on prior brain MRI. EXAM: MRA HEAD WITHOUT CONTRAST TECHNIQUE: Angiographic images of the Circle of Willis were acquired using MRA technique without intravenous contrast. COMPARISON:  Comparison made with previous brain MRI from  02/01/2021. FINDINGS: Anterior circulation: Visualized distal cervical segments of the internal carotid arteries are widely patent with antegrade flow. Petrous, cavernous, and supraclinoid segments widely patent without stenosis or other abnormality. A1 segments patent bilaterally. Right A1 hypoplastic, accounting for the slightly diminutive right ICA is compared to the left. Tiny 1-2 mm outpouching arising from the mid aspect of the anterior communicating artery complex suspicious for a small aneurysm (series 5, image 110). Anterior cerebral arteries patent to their distal aspects without stenosis. No M1 stenosis or occlusion. Normal MCA bifurcations. Distal MCA branches well perfused and symmetric. Posterior circulation: Both V4 segments widely patent to the vertebrobasilar junction without stenosis. Left vertebral artery slightly dominant. Both PICA origins patent and normal. Basilar widely patent to its distal aspect without stenosis. Superior cerebellar arteries patent bilaterally. Both PCAs primarily supplied via the basilar well perfused to their distal aspects without stenosis. Anatomic variants: Hypoplastic right A1 segment. Other: None. IMPRESSION: 1. Negative intracranial MRA with essentially normal appearance of the medium and large vessels of the intracranial circulation. No large vessel occlusion. No hemodynamically significant or correctable stenosis. 2. 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 Electronically Signed   By: Jeannine Boga M.D.   On: 02/02/2021 05:26   MR BRAIN WO CONTRAST  Result Date: 02/01/2021 CLINICAL DATA:  Dizziness EXAM: MRI HEAD WITHOUT CONTRAST TECHNIQUE: Multiplanar, multiecho pulse sequences of the brain and surrounding structures were obtained without intravenous contrast. COMPARISON:  10/05/2020 FINDINGS: Brain: There is no acute infarction or intracranial hemorrhage. A small ill-defined area of  diffusion hyperintensity and ADC isointensity is present in the left centrum semiovale. There is no intracranial mass or mass effect. There is no hydrocephalus or extra-axial fluid collection. Prominence of the ventricles and sulci reflects mild generalized parenchymal volume loss. Scattered punctate foci of susceptibility hypointensity likely reflecting chronic microhemorrhages. Patchy and confluent areas of T2 hyperintensity in the supratentorial white matter are nonspecific but probably reflect moderate chronic microvascular ischemic changes. There are chronic left frontal infarcts. Small chronic right parietooccipital infarct. Tiny chronic right cerebellar infarct. Vascular: Major vessel flow voids at the skull base are preserved. Skull and upper cervical spine: Normal marrow signal is preserved. Sinuses/Orbits: Paranasal sinuses are aerated. Orbits are unremarkable. Other: Sella is unremarkable.  Mastoid air cells are clear. IMPRESSION: Probable small subcentimeter subacute left cerebral white matter infarct. Stable chronic microvascular ischemic changes and chronic infarcts. Similar burden of scattered chronic microhemorrhages reflecting sequelae of chronic hypertension and/or amyloid angiopathy. Electronically Signed   By: Macy Mis M.D.   On: 02/01/2021 18:11   US RENAL  Result Date: 01/07/2021 CLINICAL DATA:  Acute kidney injury EXAM: RENAL / URINARY TRACT ULTRASOUND COMPLETE COMPARISON:  CT 04/11/2020 FINDINGS: Right Kidney: Renal measurements: 12.4 x 5.6 x 6.6 cm =  volume: 238 mL. Echogenicity within normal limits. Mild hydronephrosis, new since previous. Shadowing calculi largest 1.2 cm in the lower pole collecting system. Left Kidney: Renal measurements: 9.7 x 5.5 x 5.2 cm = volume: 145 mL. Echogenicity within normal limits. No mass or hydronephrosis visualized. Bladder: Appears normal for degree of bladder distention. Other: None. IMPRESSION: 1. Right hydronephrosis and nephrolithiasis. 2.  Left kidney and bladder unremarkable. Electronically Signed   By: Lucrezia Europe M.D.   On: 01/07/2021 16:13   DG Chest Portable 1 View  Result Date: 02/01/2021 CLINICAL DATA:  Chest pain since yesterday EXAM: PORTABLE CHEST 1 VIEW COMPARISON:  01/06/2021 FINDINGS: Prior median sternotomy. Lungs are adequately inflated and otherwise clear. Borderline stable cardiomegaly. Remainder of the exam is unchanged. IMPRESSION: No acute cardiopulmonary disease. Electronically Signed   By: Marin Olp M.D.   On: 02/01/2021 13:59   DG Chest Portable 1 View  Result Date: 01/06/2021 CLINICAL DATA:  COVID positive with cough EXAM: PORTABLE CHEST 1 VIEW COMPARISON:  12/31/2020 FINDINGS: Post sternotomy changes. No focal opacity or pleural effusion. Stable cardiomegaly. No pneumothorax IMPRESSION: No active disease. Electronically Signed   By: Donavan Foil M.D.   On: 01/06/2021 15:28   DG C-Arm 1-60 Min-No Report  Result Date: 01/08/2021 Fluoroscopy was utilized by the requesting physician.  No radiographic interpretation.   CT RENAL STONE STUDY  Result Date: 01/07/2021 CLINICAL DATA:  Acute renal failure EXAM: CT ABDOMEN AND PELVIS WITHOUT CONTRAST TECHNIQUE: Multidetector CT imaging of the abdomen and pelvis was performed following the standard protocol without IV contrast. COMPARISON:  Ultrasound from earlier in the same day, CT from 04/11/2020. FINDINGS: Lower chest: Few calcified granulomas are noted in the right lung base. No focal infiltrate or sizable effusion is seen. Hepatobiliary: Gallbladder demonstrates multiple gallstones without complicating factors. The liver appears within normal limits. Pancreas: Unremarkable. No pancreatic ductal dilatation or surrounding inflammatory changes. Spleen: Normal in size without focal abnormality. Adrenals/Urinary Tract: Adrenal glands are within normal limits. Left kidney demonstrates a tiny nonobstructing stone. The ureter is within normal limits. The right kidney  demonstrates increased perinephric stranding as well as hydronephrosis. This is secondary to a mid right ureteral stone best seen on image number 52 of series 2. This measures approximately 8-9 mm in greatest dimension. The more distal right ureter is within normal limits. Nonobstructing 8 mm stone is noted within the right kidney as well as a smaller upper pole stone. Bladder is decompressed. Stomach/Bowel: Scattered diverticular change of the colon is noted without evidence of diverticulitis. The appendix is within normal limits. Small bowel and stomach are unremarkable. Gastric lap band is noted in satisfactory position. Vascular/Lymphatic: Aortic atherosclerosis. No enlarged abdominal or pelvic lymph nodes. Reproductive: Prostate is unremarkable. Other: No abdominal wall hernia or abnormality. No abdominopelvic ascites. Musculoskeletal: Degenerative changes of lumbar spine are noted. IMPRESSION: Nonobstructing bilateral renal calculi. 8-9 mm stone in the mid right ureter causing obstructive change. This corresponds with that seen on recent ultrasound. Stable gastric lap band. Diverticulosis without diverticulitis. Electronically Signed   By: Inez Catalina M.D.   On: 01/07/2021 20:28   VAS US CAROTID  Result Date: 02/03/2021 Carotid Arterial Duplex Study Patient Name:  [name] [facility]  Date of Exam:   02/03/2021 Medical Rec #: [id number]       Accession #:    [id number] Date of Birth: 10-18-1945       Patient Gender: M Patient Age:   74Y Exam Location:  [facility] Procedure:  VAS US CAROTID Referring Phys: [id number] [place] --------------------------------------------------------------------------------  Indications:       CVA. Risk Factors:      Hypertension, hyperlipidemia, Diabetes, prior MI, coronary                    artery disease. Comparison Study:  No prior study Performing Technologist: Maudry Mayhew MHA, RDMS, RVT, RDCS  Examination Guidelines: A complete evaluation includes  B-mode imaging, spectral Doppler, color Doppler, and power Doppler as needed of all accessible portions of each vessel. Bilateral testing is considered an integral part of a complete examination. Limited examinations for reoccurring indications may be performed as noted.  Right Carotid Findings: +----------+--------+--------+--------+-----------------------+--------+           PSV cm/sEDV cm/sStenosisPlaque Description     Comments +----------+--------+--------+--------+-----------------------+--------+ CCA Prox  58      10                                              +----------+--------+--------+--------+-----------------------+--------+ CCA Distal56      13              smooth and heterogenous         +----------+--------+--------+--------+-----------------------+--------+ ICA Prox  37      10                                              +----------+--------+--------+--------+-----------------------+--------+ ICA Distal45      11                                              +----------+--------+--------+--------+-----------------------+--------+ ECA       74      10              focal and heterogenous          +----------+--------+--------+--------+-----------------------+--------+ +----------+--------+-------+----------------+-------------------+           PSV cm/sEDV cmsDescribe        Arm Pressure (mmHG) +----------+--------+-------+----------------+-------------------+ [id number]            Multiphasic, WNL                    +----------+--------+-------+----------------+-------------------+ +---------+--------+--+--------+-+---------+ VertebralPSV cm/s31EDV cm/s9Antegrade +---------+--------+--+--------+-+---------+  Left Carotid Findings: +----------+--------+--------+--------+-----------------------+--------+           PSV cm/sEDV cm/sStenosisPlaque Description     Comments  +----------+--------+--------+--------+-----------------------+--------+ CCA Prox  87      22                                              +----------+--------+--------+--------+-----------------------+--------+ CCA Distal67      16                                              +----------+--------+--------+--------+-----------------------+--------+ ICA Prox  64      14  smooth and heterogenous         +----------+--------+--------+--------+-----------------------+--------+ ICA Distal79      25                                              +----------+--------+--------+--------+-----------------------+--------+ ECA       64      11                                              +----------+--------+--------+--------+-----------------------+--------+ +----------+--------+--------+----------+-------------------+           PSV cm/sEDV cm/sDescribe  Arm Pressure (mmHG) +----------+--------+--------+----------+-------------------+ Subclavian114             Monophasic                    +----------+--------+--------+----------+-------------------+ +---------+--------+--+--------+--+---------+ VertebralPSV cm/s41EDV cm/s12Antegrade +---------+--------+--+--------+--+---------+   Summary: Right Carotid: Velocities in the right ICA are consistent with a 1-39% stenosis. Left Carotid: Velocities in the left ICA are consistent with a 1-39% stenosis. Vertebrals:  Bilateral vertebral arteries demonstrate antegrade flow. Subclavians: Normal flow hemodynamics were seen in the right subclavian artery.              Monophasic left subclavian artery, suggestive of possible proximal              obstruction. *See table(s) above for measurements and observations.  Electronically signed by Antony Contras MD on 02/03/2021 at 5:30:18 PM.    Final    US Abdomen Limited RUQ (LIVER/GB)  Result Date: 01/10/2021 CLINICAL DATA:  Elevated liver enzymes EXAM: ULTRASOUND ABDOMEN LIMITED  RIGHT UPPER QUADRANT COMPARISON:  CT abdomen and pelvis Jan 07, 2021 FINDINGS: Gallbladder: Small layering gallstones are noted, better delineated on recent CT. Largest gallstone measures 3-4 mm. No gallbladder wall thickening or pericholecystic fluid. No sonographic Murphy sign noted by sonographer. Common bile duct: Diameter: 5 mm. No intrahepatic or extrahepatic biliary duct dilatation. Liver: No focal lesion identified. Within normal limits in parenchymal echogenicity. Portal vein is patent on color Doppler imaging with normal direction of blood flow towards the liver. Other: None. IMPRESSION: Gallstones seen to layer posteriorly, better delineated on recent CT examination. No gallbladder wall thickening or pericholecystic fluid appreciable. Study otherwise unremarkable. Electronically Signed   By: Lowella Grip III M.D.   On: 01/10/2021 09:10    Subjective: No new complaints  Discharge Exam: Vitals:   02/03/21 2318 02/04/21 0410  BP: 123/64 116/68  Pulse: (!) 57 64  Resp: 18 18  Temp: 99.3 F (37.4 C) 97.7 F (36.5 C)  SpO2: 100% 99%   Vitals:   02/03/21 2020 02/03/21 2318 02/03/21 2318 02/04/21 0410  BP: 130/67 123/64 123/64 116/68  Pulse: (!) 51 61 (!) 57 64  Resp: 18 18 18 18   Temp: 98.4 F (36.9 C) 99.3 F (37.4 C) 99.3 F (37.4 C) 97.7 F (36.5 C)  TempSrc: Oral Oral Oral Oral  SpO2: 100% 98% 100% 99%  Weight:      Height:        General: Pt is alert, awake, not in acute distress Cardiovascular: RRR, S1/S2 +, no rubs, no gallops Respiratory: CTA bilaterally, no wheezing, no rhonchi Abdominal: Soft, NT, ND, bowel sounds + Extremities: no edema, no cyanosis    The results  of significant diagnostics from this hospitalization (including imaging, microbiology, ancillary and laboratory) are listed below for reference.     Microbiology: Recent Results (from the past 240 hour(s))  Resp Panel by RT-PCR (Flu A&B, Covid)     Status: None   Collection Time: 02/02/21   2:46 AM  Result Value Ref Range Status   SARS Coronavirus 2 by RT PCR NEGATIVE NEGATIVE Final    Comment: (NOTE) SARS-CoV-2 target nucleic acids are NOT DETECTED.  The SARS-CoV-2 RNA is generally detectable in upper respiratory specimens during the acute phase of infection. The lowest concentration of SARS-CoV-2 viral copies this assay can detect is 138 copies/mL. A negative result does not preclude SARS-Cov-2 infection and should not be used as the sole basis for treatment or other patient management decisions. A negative result may occur with  improper specimen collection/handling, submission of specimen other than nasopharyngeal swab, presence of viral mutation(s) within the areas targeted by this assay, and inadequate number of viral copies(<138 copies/mL). A negative result must be combined with clinical observations, patient history, and epidemiological information. The expected result is Negative.  Fact Sheet for Patients:  EntrepreneurPulse.com.au  Fact Sheet for Healthcare Providers:  IncredibleEmployment.be  This test is no t yet approved or cleared by the [place] FDA and  has been authorized for detection and/or diagnosis of SARS-CoV-2 by FDA under an Emergency Use Authorization (EUA). This EUA will remain  in effect (meaning this test can be used) for the duration of the COVID-19 declaration under Section 564(b)(1) of the Act, 21 U.S.C.section 360bbb-3(b)(1), unless the authorization is terminated  or revoked sooner.       Influenza A by PCR NEGATIVE NEGATIVE Final   Influenza B by PCR NEGATIVE NEGATIVE Final    Comment: (NOTE) The Xpert Xpress SARS-CoV-2/FLU/RSV plus assay is intended as an aid in the diagnosis of influenza from Nasopharyngeal swab specimens and should not be used as a sole basis for treatment. Nasal washings and aspirates are unacceptable for Xpert Xpress SARS-CoV-2/FLU/RSV testing.  Fact Sheet for  Patients: EntrepreneurPulse.com.au  Fact Sheet for Healthcare Providers: IncredibleEmployment.be  This test is not yet approved or cleared by the [place] FDA and has been authorized for detection and/or diagnosis of SARS-CoV-2 by FDA under an Emergency Use Authorization (EUA). This EUA will remain in effect (meaning this test can be used) for the duration of the COVID-19 declaration under Section 564(b)(1) of the Act, 21 U.S.C. section 360bbb-3(b)(1), unless the authorization is terminated or revoked.  Performed at [facility], [place] [address] [place], [place] [address]      Labs: BNP (last 3 results) Recent Labs    03/07/20 1029 10/04/20 1331  BNP 161.1* 637.8*   Basic Metabolic Panel: Recent Labs  Lab 02/01/21 1211 02/01/21 1233 02/02/21 0214  NA 135 135 138  K 3.9 3.8 4.0  CL 101 101 103  CO2 25  --  26  GLUCOSE 153* 151* 96  BUN 29* 32* 31*  CREATININE 3.10* 3.20* 2.88*  CALCIUM 9.3  --  9.3   Liver Function Tests: Recent Labs  Lab 02/01/21 1211 02/02/21 0214  AST 28 27  ALT 35 32  ALKPHOS 116 95  BILITOT 0.8 0.8  PROT 5.6* 5.1*  ALBUMIN 3.3* 3.1*   No results for input(s): LIPASE, AMYLASE in the last 168 hours. No results for input(s): AMMONIA in the last 168 hours. CBC: Recent Labs  Lab 02/01/21 1211 02/01/21 1233 02/02/21 0214  WBC 7.8  --  7.2  NEUTROABS 5.7  --   --   HGB 11.6* 12.6* 11.9*  HCT 35.3* 37.0* 35.4*  MCV 102.9*  --  101.4*  PLT 168  --  167   Cardiac Enzymes: No results for input(s): CKTOTAL, CKMB, CKMBINDEX, TROPONINI in the last 168 hours. BNP: Invalid input(s): POCBNP CBG: Recent Labs  Lab 02/03/21 0540 02/03/21 1437 02/03/21 1755 02/03/21 2356 02/04/21 0614  GLUCAP 119* 167* 129* 193* 103*   D-Dimer No results for input(s): DDIMER in the last 72 hours. Hgb A1c Recent Labs    02/02/21 0214  HGBA1C 6.2*   Lipid Profile Recent Labs    02/02/21 0214   CHOL 115  HDL 46  LDLCALC 50  TRIG 94  CHOLHDL 2.5   Thyroid function studies Recent Labs    02/02/21 0214  TSH 2.330   Anemia work up Recent Labs    02/02/21 0214  VITAMINB12 630  FOLATE 8.6   Urinalysis    Component Value Date/Time   COLORURINE STRAW (A) 02/01/2021 1608   APPEARANCEUR CLEAR 02/01/2021 1608   LABSPEC 1.005 02/01/2021 1608   PHURINE 6.0 02/01/2021 1608   GLUCOSEU NEGATIVE 02/01/2021 1608   HGBUR NEGATIVE 02/01/2021 1608   [place] 02/01/2021 1608   KETONESUR NEGATIVE 02/01/2021 1608   PROTEINUR NEGATIVE 02/01/2021 1608   UROBILINOGEN 0.2 04/04/2015 1529   NITRITE NEGATIVE 02/01/2021 1608   LEUKOCYTESUR NEGATIVE 02/01/2021 1608   Sepsis Labs Invalid input(s): PROCALCITONIN,  WBC,  LACTICIDVEN Microbiology Recent Results (from the past 240 hour(s))  Resp Panel by RT-PCR (Flu A&B, Covid)     Status: None   Collection Time: 02/02/21  2:46 AM  Result Value Ref Range Status   SARS Coronavirus 2 by RT PCR NEGATIVE NEGATIVE Final    Comment: (NOTE) SARS-CoV-2 target nucleic acids are NOT DETECTED.  The SARS-CoV-2 RNA is generally detectable in upper respiratory specimens during the acute phase of infection. The lowest concentration of SARS-CoV-2 viral copies this assay can detect is 138 copies/mL. A negative result does not preclude SARS-Cov-2 infection and should not be used as the sole basis for treatment or other patient management decisions. A negative result may occur with  improper specimen collection/handling, submission of specimen other than nasopharyngeal swab, presence of viral mutation(s) within the areas targeted by this assay, and inadequate number of viral copies(<138 copies/mL). A negative result must be combined with clinical observations, patient history, and epidemiological information. The expected result is Negative.  Fact Sheet for Patients:  EntrepreneurPulse.com.au  Fact Sheet for Healthcare  Providers:  IncredibleEmployment.be  This test is no t yet approved or cleared by the [place] FDA and  has been authorized for detection and/or diagnosis of SARS-CoV-2 by FDA under an Emergency Use Authorization (EUA). This EUA will remain  in effect (meaning this test can be used) for the duration of the COVID-19 declaration under Section 564(b)(1) of the Act, 21 U.S.C.section 360bbb-3(b)(1), unless the authorization is terminated  or revoked sooner.       Influenza A by PCR NEGATIVE NEGATIVE Final   Influenza B by PCR NEGATIVE NEGATIVE Final    Comment: (NOTE) The Xpert Xpress SARS-CoV-2/FLU/RSV plus assay is intended as an aid in the diagnosis of influenza from Nasopharyngeal swab specimens and should not be used as a sole basis for treatment. Nasal washings and aspirates are unacceptable for Xpert Xpress SARS-CoV-2/FLU/RSV testing.  Fact Sheet for Patients: EntrepreneurPulse.com.au  Fact Sheet for Healthcare Providers: IncredibleEmployment.be  This test is not yet approved  or cleared by the [place] and has been authorized for detection and/or diagnosis of SARS-CoV-2 by FDA under an Emergency Use Authorization (EUA). This EUA will remain in effect (meaning this test can be used) for the duration of the COVID-19 declaration under Section 564(b)(1) of the Act, 21 U.S.C. section 360bbb-3(b)(1), unless the authorization is terminated or revoked.  Performed at [facility], [place] [address], [place], [place] [address]      Time coordinating discharge: Over 30 minutes  SIGNED:   Charlynne Cousins, MD  [facility] 02/04/2021, 7:24 AM Pager   If 7PM-7AM, please contact night-coverage [email or web address] Password [email or web address]

## 2021-02-05 ENCOUNTER — Other Ambulatory Visit: Payer: Self-pay

## 2021-02-05 ENCOUNTER — Encounter (HOSPITAL_COMMUNITY): Payer: Self-pay | Admitting: Cardiology

## 2021-02-05 ENCOUNTER — Ambulatory Visit (HOSPITAL_COMMUNITY)
Admission: RE | Admit: 2021-02-05 | Discharge: 2021-02-05 | Disposition: A | Discharge status: Home / Self Care | Payer: Medicare Other | Source: Ambulatory Visit | Attending: Cardiology | Admitting: Cardiology

## 2021-02-05 VITALS — BP 118/78 | HR 61 | Wt 183.4 lb

## 2021-02-05 DIAGNOSIS — I130 Hypertensive heart and chronic kidney disease with heart failure and stage 1 through stage 4 chronic kidney disease, or unspecified chronic kidney disease: Secondary | ICD-10-CM | POA: Insufficient documentation

## 2021-02-05 DIAGNOSIS — Z79899 Other long term (current) drug therapy: Secondary | ICD-10-CM | POA: Diagnosis not present

## 2021-02-05 DIAGNOSIS — I080 Rheumatic disorders of both mitral and aortic valves: Secondary | ICD-10-CM | POA: Insufficient documentation

## 2021-02-05 DIAGNOSIS — I5032 Chronic diastolic (congestive) heart failure: Secondary | ICD-10-CM | POA: Diagnosis not present

## 2021-02-05 DIAGNOSIS — G4733 Obstructive sleep apnea (adult) (pediatric): Secondary | ICD-10-CM | POA: Diagnosis not present

## 2021-02-05 DIAGNOSIS — I770 Arteriovenous fistula, acquired: Secondary | ICD-10-CM | POA: Diagnosis not present

## 2021-02-05 DIAGNOSIS — Z8249 Family history of ischemic heart disease and other diseases of the circulatory system: Secondary | ICD-10-CM | POA: Diagnosis not present

## 2021-02-05 DIAGNOSIS — I471 Supraventricular tachycardia: Secondary | ICD-10-CM | POA: Diagnosis not present

## 2021-02-05 DIAGNOSIS — E785 Hyperlipidemia, unspecified: Secondary | ICD-10-CM | POA: Insufficient documentation

## 2021-02-05 DIAGNOSIS — R531 Weakness: Secondary | ICD-10-CM | POA: Insufficient documentation

## 2021-02-05 DIAGNOSIS — I480 Paroxysmal atrial fibrillation: Secondary | ICD-10-CM | POA: Diagnosis not present

## 2021-02-05 DIAGNOSIS — I252 Old myocardial infarction: Secondary | ICD-10-CM | POA: Insufficient documentation

## 2021-02-05 DIAGNOSIS — Z7982 Long term (current) use of aspirin: Secondary | ICD-10-CM | POA: Diagnosis not present

## 2021-02-05 DIAGNOSIS — E669 Obesity, unspecified: Secondary | ICD-10-CM | POA: Diagnosis not present

## 2021-02-05 DIAGNOSIS — N179 Acute kidney failure, unspecified: Secondary | ICD-10-CM | POA: Insufficient documentation

## 2021-02-05 DIAGNOSIS — N184 Chronic kidney disease, stage 4 (severe): Secondary | ICD-10-CM | POA: Insufficient documentation

## 2021-02-05 DIAGNOSIS — Z8616 Personal history of COVID-19: Secondary | ICD-10-CM | POA: Diagnosis not present

## 2021-02-05 DIAGNOSIS — I2510 Atherosclerotic heart disease of native coronary artery without angina pectoris: Secondary | ICD-10-CM | POA: Diagnosis not present

## 2021-02-05 DIAGNOSIS — E1122 Type 2 diabetes mellitus with diabetic chronic kidney disease: Secondary | ICD-10-CM | POA: Diagnosis not present

## 2021-02-05 DIAGNOSIS — Z7901 Long term (current) use of anticoagulants: Secondary | ICD-10-CM | POA: Insufficient documentation

## 2021-02-05 LAB — VITAMIN B1

## 2021-02-05 LAB — METHYLMALONIC ACID, SERUM: Methylmalonic Acid, Quantitative: 608 nmol/L — ABNORMAL HIGH (ref 0–378)

## 2021-02-05 NOTE — Patient Instructions (Signed)
Labs done today. We will contact you only if your labs are abnormal.  No medication changes were made. Please continue all current medications as prescribed.  Your physician recommends that you schedule a follow-up appointment in: 6 weeks with our [facility] here in our office.    If you have any questions or concerns before your next appointment please send [place] a message through [place] or call our office at [phone].    TO LEAVE A MESSAGE FOR THE NURSE SELECT OPTION 2, PLEASE LEAVE A MESSAGE INCLUDING: YOUR NAME DATE OF BIRTH CALL BACK NUMBER REASON FOR CALL**this is important as we prioritize the call backs  YOU WILL RECEIVE A CALL BACK THE SAME DAY AS LONG AS YOU CALL BEFORE 4:00 PM   Do the following things EVERYDAY: Weigh yourself in the morning before breakfast. Write it down and keep it in a log. Take your medicines as prescribed Eat low salt foods--Limit salt (sodium) to 2000 mg per day.  Stay as active as you can everyday Limit all fluids for the day to less than 2 liters   At the [facility], you and your health needs are our priority. As part of our continuing mission to provide you with exceptional heart care, we have created designated Provider Care Teams. These Care Teams include your primary Cardiologist (physician) and Advanced Practice Providers (APPs- Physician Assistants and Nurse Practitioners) who all work together to provide you with the care you need, when you need it.   You may see any of the following providers on your designated Care Team at your next follow up: Dr Glori Bickers Dr Haynes Kerns, NP Lyda Jester, [place] Audry Riles, PharmD   Please be sure to bring in all your medications bottles to every appointment.

## 2021-02-06 NOTE — Progress Notes (Signed)
Patient ID: [name], male   DOB: Apr 20, 1946, 75 y.o.   MRN: [id number] PCP: Dr Harrington Challenger Cardiology: Dr. Aundra Dubin  75 y.o. with history of CAD s/p CABG and obesity s/p lap banding procedure presents for cardiology followup.  Given exertional dyspnea and chest tightness, he had LHC in 1/15.  This showed no change from prior.  Grafts were patent and there was severe stenosis in distal D1, not amenable to PCI.   In 2017, he developed increased exertional dyspnea.  Eventually had a TEE showing severe MR with prolapse of a portion of the anterior mitral valve leaflet.  Coronary angiography in 6/17 showed patent grafts and 90% stenosis of distal diagonal not amenable to intervention.  In 8/17, he had mitral valve repair.  Post-op diastolic CHF, Lasix started and increased.   He had a cath again in 9/18 showing stable coronary anatomy.    He developed atrial arrhythmias in 12/18.  Both fibrillation and flutter were seen.  Saw Dr. Rayann Heman, recommended DCCV with amiodarone or sotalol if fibrillation recurs.  He had DCCV in 1/19.   In 6/19, he was noted to have developed AKI. Creatinine went up to 3.46.  Renal US was done, showing normal kidneys. Cause of AKI was not clear.  We stopped his Lasix. He saw nephrology and eventually went back on Lasix, currently taking 80 mg daily.  He is not using any NSAIDs.    He stopped pravastatin due to myalgias. We struggled to get him on a PCSK9 inhibitor through the [place] but he was finally able to get Praluent.    I had him do a Lexiscan Cardiolite in 11/19, which showed old inferior MI with no ischemia.  Echo in 11/19 showed EF 55-60%, s/p MV repair with mean gradient 6 mmHg. Echo 12/20 showed EF 50-55%, mildly decreased RV systolic function, repaired mitral valve with mild MR, mean gradient 6 mmHg across MV.    He had DCCV from atrial fibrillation to NSR in 1/21 and again in 2/21.   At last appointment, he reported significant fatigue and dyspnea in setting of weight  loss with low BP and HR.  I stopped his Coreg. He felt better with that change.    In 9/21, he had COVID-19 infection but was not hospitalized.   Zio patch in 3/22 showed frequent short atrial tachycardia episodes.  He saw Dr. Rayann Heman, medical therapy recommended (on amiodarone).   He was admitted in 5/22 with AKI from obstruction of right ureter by stone, he was treated by urology.   He was admitted in 6/22 with right-sided weakness and was found to have left cerebral white matter CVA.  He had been compliant with Eliquis, ASA 81 was added.   Today he returns for HF follow up.  He still has mild right arm and leg weakness from CVA, improved overall.  To start PT on Monday.  He is taking Lasix 40 mg daily.  He fatigues easily. No recent chest pain.  Weight is down 11 lbs.  No palpitations.     ECG (personally reviewed): NSR, IVCD 134 msec  Labs (11/12): LDL 70 Labs (5/13): K 4.1, creatinine 1.1, BNP 48 Labs (12/13): K 3.4, creatinine 1.07, LDL 59, HDL 37 Labs (1/15): K 4.3, creatinine 1.2, HCT 40.3 Labs (3/15): LDL 38, HDL 81, BNP 55, K 4, creatinine 1.2 Labs (6/15): K 4.2, creatinine 1.4, LFTs normal, LDL 66, HDL 35 Labs (8/16): K 4, creatinine 1.6, BNP 64 Labs (12/16): LDL 61, HDL 37 Labs (  4/17): K 4.2, creatinine 1.35, HCT 38.7, BNP 80 Labs (8/17): K 4.2, creatinine 1.4 => 1.66, BNP 159 Labs (12/17): K 3.6, creatinine 1.75 Labs (2/18): LDL 76, HDL 36 Labs (9/18): K 3.9, creatinine 1.76 Labs (1/19): K 4.9, creatinine 1.76, hgb 14 Labs (6/19): K 3.6, creatinine 3.46 Labs (7/19): K 4.1, creatinine 3.66, hgb 12.7 Labs (10/19): LDL 177, hgb 13.4, troponin negative, K 4, creatinine 4.15 Labs (12/19): K 4.3, creatinine 3.88 Labs (4/20): K 3.6, creatinine 3.39 Labs (11/20): LDL 73, HDL 38 Labs (2/21): K 3.4, creatinine 3.8 Labs (7/21): K 3.7, creatinine 3.66 Labs (6/22): K 4, creatinine 2.88, LDL 50  PMH: 1. OSA: on CPAP.  2. Diabetes mellitus 3. Allergic rhinitis 4. CAD: s/p CABG  1995.  LHC (10/11) with 90% distal D1 (patent proximal D1 stent), total occlusion CFX, total occlusion RCA, no significant stenoses in LAD, SVG-PDA patent, SVG-OM patent, EF 55%.  Medical management.  LHC (5/13) with patent grafts and 90% distal D1 stenosis (no significant change from 10/11).  LHC (1/15) with patent grafts, 75% ISR in D1 stent, 90% stenosis distal D1 (small caliber). D1 not amenable to intervention.  - Coronary angiography (6/17): grafts patent, 90% stenosis distally in large diagonal not amenable to intervention.   - LHC (9/18): SVG-OM and SVG-PDA patent,  75% in-stent restenosis in proximal diagonal then 90% stenosis distally (small in caliber at that point), totally occluded LCx and RCA.  Left coronary system similar to prior caths, D1 may be source of angina.  - Lexiscan Cardiolite (11/19): EF 47%, old inferior MI, no ischemia.  5. Obesity: lap-banding in 5/11.  6. Chronic diastolic CHF: Echo (4/48) with EF 55-60%, moderate LVH, moderate diastolic dysfunction, s/p mitral valve repair with elevated mean gradient but normal pressure half-time.  - Echo (8/18): EF 55-60%, s/p MV repair with mean gradient 6 mmHg, PASP 28 mmHg, mildly decreased RV systolic function.  - Echo (11/19): EF 55-60%, s/p MV repair with mean gradient 6, PASP 42 mmHg, mildly dilated RV, dilated IVC.  - Echo (12/20): EF 50-55%, mildly decreased RV systolic function. S/p MV repair with mean gradient 6 mmHg, mild MR.  - Echo (2/22): EF 45-50%, mild LVH, moderate RV enlargement with low normal systolic function, s/p mitral valve repair with mean gradient 5 mmHg and trivial MR, mild-moderate AS.  7. TIA: Carotid dopplers in 11/11 with 0-39% bilateral stenosis.  Carotid dopplers 6/17 with 1-39% RICA stenosis.  8. OA: Knees, C-spine. TKR 9/13.  9. HTN 10. Hyperlipidemia: Myalgias with atorvastatin and Crestor. Now on Praluent.  11. Diverticulosis 12. MItral regurgitation: TEE (6/17) with EF 55-60%, severe eccentric MR,  prolapse of a portion of the anterior leaflet, peak RV-RA gradient 45 mmHg.  - Mitral valve repair 8/17 13. CKD: Stage III.  14. Atrial fibrillation/atrial flutter: Both arrhythmias noted in 12/18.  DCCV 12/19.   - DCCV to NSR 1/21.  - DCCV to NSR 2/21.  15. AKI 6/19 => CKD stage 4: uncertain etiology.  Renal US was normal.  16. Gout 17. COVID-19 infection 9/21 18. 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  - Carotid dopplers (6/22) with mild disease only.  19. Nephrolithiasis.  20. Atrial tachycardia: Noted on 3/22 Zio patch.  21. Aortic stenosis: mild-moderate on 2/22 echo.   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  Lives in [place].   Family History  Problem Relation Age of Onset   Other Mother  joint problems   Alzheimer's disease Mother    Hypertension Father    Heart disease Father    CVA Father        age 46   Depression Father    Heart disease Sister        CABG   Stroke Sister    Heart failure Sister        congestive   Diabetes Sister    Heart disease Brother    Hypertension Brother    Congestive Heart Failure Brother     ROS: All systems reviewed and negative except as per HPI.    Current Outpatient Medications  Medication Sig Dispense Refill   Alirocumab (PRALUENT) 75 MG/ML SOAJ Inject 75 mg into the skin every 14 (fourteen) days.     allopurinol (ZYLOPRIM) 100 MG tablet Take 0.5 tablets (50 mg total) by mouth 2 (two) times a week. Please discuss dosing with your PCP based on your renal function. 4 tablet 0   amiodarone (PACERONE) 200 MG tablet Take 1 tablet (200 mg total) by mouth daily. 60 tablet 0   apixaban (ELIQUIS) 5 MG TABS tablet Take 1 tablet (5 mg total) by mouth 2 (two) times daily. 180 tablet 3   Ascorbic Acid (VITAMIN C WITH ROSE HIPS) 1000 MG tablet Take 1,000 mg by mouth daily.     aspirin EC 81 MG EC tablet Take 1 tablet (81 mg total) by mouth daily. Swallow whole. 30 tablet 11    calcitRIOL (ROCALTROL) 0.25 MCG capsule Take 0.25 mcg by mouth every other day.     cetirizine (ZYRTEC) 10 MG tablet Take 10 mg by mouth daily.     colchicine 0.6 MG tablet Take 0.6-1.2 mg by mouth 2 (two) times daily as needed (gout).      fluticasone (FLONASE) 50 MCG/ACT nasal spray Place 1 spray into both nostrils daily as needed for allergies or rhinitis.     HYDROcodone-acetaminophen (NORCO/VICODIN) 5-325 MG tablet Take 1 tablet by mouth daily as needed for severe pain.     nitroGLYCERIN (NITROSTAT) 0.4 MG SL tablet 1 TAB UNDER THE TONGUE EVERY 5 MINUTES AS NEEDED FOR CHEST PAIN UP TO 3 DOSES, IF PERSIST CALL 911 75 tablet 1   pantoprazole (PROTONIX) 40 MG tablet Take 40 mg by mouth daily as needed (heartburn).     Semaglutide,0.25 or 0.5MG /DOS, (OZEMPIC, 0.25 OR 0.5 MG/DOSE,) 2 MG/1.5ML SOPN Inject 0.375 mLs (0.5 mg total) into the skin every Thursday. 3 pen 3   tadalafil (CIALIS) 20 MG tablet TAKE 1 TABLET BY MOUTH ONCE DAILY AS NEEDED 10 tablet 0   traZODone (DESYREL) 100 MG tablet Take 100 mg by mouth at bedtime.     No current facility-administered medications for this encounter.    BP 118/78   Pulse 61   Wt 83.2 kg (183 lb 6.4 oz)   SpO2 100%   BMI 25.94 kg/m    Wt Readings from Last 3 Encounters:  02/05/21 83.2 kg (183 lb 6.4 oz)  02/01/21 77.1 kg (170 lb)  01/10/21 82.4 kg (181 lb 9.6 oz)   General: NAD Neck: No JVD, no thyromegaly or thyroid nodule.  Lungs: Clear to auscultation bilaterally with normal respiratory effort. CV: Nondisplaced PMI.  Heart regular S1/S2, no S3/S4, 2/6 early SEM RUSB.  No peripheral edema.  No carotid bruit.  Normal pedal pulses.  Abdomen: Soft, nontender, no hepatosplenomegaly, no distention.  Skin: Intact without lesions or rashes.  Neurologic: Alert and oriented x 3.  Psych: Normal  affect. Extremities: No clubbing or cyanosis.  HEENT: Normal.   Assessment/Plan: 1. S/p mitral valve repair:  Echo 2/22 showed stable mitral valve repair  compared to prior echo with trivial MR, mean gradient 5 mmHg.   - With prosthetic material, should use antibiotic prophylaxis with dental work.  2. CAD:  He has severe stenosis in a distal D1 that is not amenable to intervention.  This has been stable on last couple of caths. He has occluded RCA and LCx known from the past with SVGs to OM and PDA.  Echo in 2/22 with EF 45-50%. Cardiolite in 11/19 showed no ischemia. No chest pain.  - ASA 81 daily.  - He cannot tolerate statins with myalgias, Now on Praluent.    3. Hyperlipidemia: Myalgias with Crestor, pravastatin, and atorvastatin.  We were finally able to get him Praluent through the [place]. LDL 50 in 6/22.  4. Chronic primarily diastolic CHF:  NYHA class II symptoms, he is not volume overloaded on exam.  Echo (2/22) with EF 45-50%, mild LVH, moderate RV enlargement with low normal systolic function, s/p mitral valve repair with mean gradient 5 mmHg and trivial MR, mild-moderate AS.  - Continue Lasix 40 mg daily. 5. OSA: Continue CPAP.  6. Atrial fibrillation: Paroxysmal, in NSR after DCCV in 2/21.  He is now on amiodarone to maintain NSR given rapid recurrence of AF after DCCV in 1/21.  Probably not a good ablation candidate with size of atria.   - Continue apixaban 5 mg bid.  - Continue amiodarone 200 mg daily. Check LFTs, TSH at next appt.  He will need a regular eye exam.  7. CKD stage 4: AKI without recovery, uncertain etiology.  He sees nephrology, there is concern that he is progressing towards HD. He now has AV fistula. Recent creatinine 2.88.  8. Diabetes: He is now followed by endocrinology. 9. Atrial tachycardia: Paroxysmal.  Seen by Dr. Rayann Heman, medical management planned.  Continue amiodarone.  10. CVA: Atrial fibrillation-related.  Carotid dopplers 6/22 with mild stenosis only.  - He has been on Eliquis, ASA 81 added by neurology.  11. Aortic stenosis: Mild-moderate on 2/22 echo.   Followup in 6 wks with APP.   [name] 02/06/2021

## 2021-02-10 DIAGNOSIS — I6300 Cerebral infarction due to thrombosis of unspecified precerebral artery: Secondary | ICD-10-CM | POA: Diagnosis not present

## 2021-02-10 DIAGNOSIS — R531 Weakness: Secondary | ICD-10-CM | POA: Diagnosis not present

## 2021-02-13 ENCOUNTER — Encounter: Payer: Self-pay | Admitting: Internal Medicine

## 2021-02-13 ENCOUNTER — Ambulatory Visit (INDEPENDENT_AMBULATORY_CARE_PROVIDER_SITE_OTHER): Payer: Medicare Other | Admitting: Internal Medicine

## 2021-02-13 ENCOUNTER — Other Ambulatory Visit: Payer: Self-pay

## 2021-02-13 VITALS — BP 118/82 | HR 64 | Ht 70.0 in | Wt 171.0 lb

## 2021-02-13 DIAGNOSIS — E669 Obesity, unspecified: Secondary | ICD-10-CM

## 2021-02-13 DIAGNOSIS — E782 Mixed hyperlipidemia: Secondary | ICD-10-CM | POA: Diagnosis not present

## 2021-02-13 DIAGNOSIS — I6330 Cerebral infarction due to thrombosis of unspecified cerebral artery: Secondary | ICD-10-CM | POA: Diagnosis not present

## 2021-02-13 DIAGNOSIS — E1165 Type 2 diabetes mellitus with hyperglycemia: Secondary | ICD-10-CM | POA: Diagnosis not present

## 2021-02-13 DIAGNOSIS — E1159 Type 2 diabetes mellitus with other circulatory complications: Secondary | ICD-10-CM

## 2021-02-13 DIAGNOSIS — R531 Weakness: Secondary | ICD-10-CM | POA: Diagnosis not present

## 2021-02-13 DIAGNOSIS — I6300 Cerebral infarction due to thrombosis of unspecified precerebral artery: Secondary | ICD-10-CM | POA: Diagnosis not present

## 2021-02-13 NOTE — Patient Instructions (Signed)
Please continue: - Ozempic 0.5 mg weekly   OK to take Glipizide 2.5-5 mg before a larger dinner.  Check some sugars at night, also.  Please return in 4 months with your sugar log.

## 2021-02-13 NOTE — Progress Notes (Signed)
Patient ID: [name], male   DOB: Apr 17, 1946, 75 y.o.   MRN: [id number]   This visit occurred during the SARS-CoV-2 public health emergency.  Safety protocols were in place, including screening questions prior to the visit, additional usage of staff PPE, and extensive cleaning of exam room while observing appropriate contact time as indicated for disinfecting solutions.   HPI: [name] is a 75 y.o.-year-old male, initially referred by his nephrologist, Dr. 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 fib, CKD stage V, cerebrovascular disease, s/p TIA, ED).  Last visit 4 months ago. He is here with his wife who offers part of the history especially regarding blood sugars, medication doses, and past medical history.  Interim history: He has a stroke 2 weeks ago - weak R side of body, but improving. He is in PT. He will see Dr. Leta Baptist soon.  He was recently admitted 02/01/2021 for chest pain. He had AKI.  Reviewed HbA1c levels: Lab Results  Component Value Date   HGBA1C 6.2 (H) 02/02/2021   HGBA1C 6.3 (H) 01/07/2021   HGBA1C 6.0 (H) 10/05/2020   HGBA1C 5.9 (A) 01/29/2020   HGBA1C 7.1 (A) 09/25/2019   HGBA1C 7.1 (A) 05/23/2019   HGBA1C 8.5 (A) 02/02/2019   HGBA1C 7.2 (H) 03/23/2016   HGBA1C 6.5 (H) 08/20/2012  01/11/2019: HbA1c 8.3% 10/04/2018: HbA1c 8.2%  He is currently on: - Ozempic 0.5 mg weekly >> stopped 04/2020 >> restarted 05/2020 He came off metformin due to worsening kidney function. We stopped glipizide 09/2020 due to good control.  Pt checks his sugars once a day: - am: 69, 93, 100-177, 182,  224 >> 96-135, 154 >> 104-155 - 2h after b'fast: n/c >> 207 >> 90, 110 >> n/c - before lunch: n/c >> 102-130 - 2h after lunch: n/c >> 120-130 >> n/c >> 134, 165 >> n/c - before dinner: 129 >> n/c >> 86 - 2h after dinner: n/c >> 190 >> n/c  -  bedtime: n/c >> 77-122 >> n/c - nighttime: n/c Lowest sugar was 85 >> 104 >> 69 >> 104; he has hypoglycemia awareness in the 70s. Highest sugar was 299 >> ... 160 >> 224 >> 170.  Pt's meals are: - Breakfast: egg and bacon (not lately).   - Brunch: meat + veggies - Dinner: same as lunch - Snacks: crackers, popcorn   -He has stage V CKD-sees nephrology-did not have to start dialysis yet but has a fistula placed; last BUN/creatinine:  Lab Results  Component Value Date   BUN 31 (H) 02/02/2021   BUN 32 (H) 02/01/2021   CREATININE 2.88 (H) 02/02/2021   CREATININE 3.20 (H) 02/01/2021  01/11/2019: 45//3.79, GFR 16, glucose 149, ACR 52.5 10/04/2018: 32/3.78, GFR 15, glucose 184 He is not on ACE inhibitor/ARB.  -+ HL; last set of lipids: Lab Results  Component Value Date   CHOL 115 02/02/2021   HDL 46 02/02/2021   LDLCALC 50 02/02/2021   TRIG 94 02/02/2021   CHOLHDL 2.5 02/02/2021  He developed generalized aches and pains from pravastatin.  Also of Zetia.  He is currently on Praluent.  - last eye exam was in 04-12/2020: [facility] [place] ([facility]   - no numbness and tingling in his feet.  Pt has FH of DM in sister and brother.  He has a history of lap band surgery 2010. Lost from 373 lbs to 215-220 lbs. 01/23/2021: B12  vitamin 733, TSH 1.83  He also has hypertension.  ROS: Constitutional: no weight gain/+ weight loss, no fatigue, no subjective hyperthermia, no subjective hypothermia Eyes: no blurry vision, no xerophthalmia ENT: no sore throat, no nodules palpated in neck, no dysphagia, no odynophagia, no hoarseness Cardiovascular: no CP/no SOB/no palpitations/no leg swelling Respiratory: no cough/no SOB/no wheezing Gastrointestinal: no N/no V/no D/no C/no acid reflux Musculoskeletal: no muscle aches/no joint aches Skin: no rashes, no hair loss, + scrape on right knee-bleeding (bandaged) Neurological: no tremors/+ numbness/no tingling/no dizziness  I reviewed pt's medications,  allergies, PMH, social hx, family hx, and changes were documented in the history of present illness. Otherwise, unchanged from my initial visit note.  Past Medical History:  Diagnosis Date   Allergic rhinitis    Anxiety    Atrial fibrillation (HCC)    Cancer (HCC)    hx of skin cancers    Chronic kidney disease    Coronary artery disease    a. s/p MI & CABG; b. s/p PCI Diag;  c. Cath 12/2011: LAD diff dzs/small, Diag 75% isr, 90 dist to stent (small), LCX & RCA occluded, VG->OM 40, VG->PDA patent - med rx.   Depression    PTSD   Diabetes mellitus    not on medications   Diastolic CHF, chronic ([place])    a. EF 55-60% by echo 2007   GERD (gastroesophageal reflux disease)    "not anymore" " I had a lap band"   History of diverticulitis of colon    5 YRS AGO   History of pneumonia    2017   History of transient ischemic attack (TIA)    CAROTID DOPPLERS NOV 2011  0-39& BIL. 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/25/2016   Complex valvuloplasty including artificial Gore-tex neochord placement x4, plication of anterior commissure, and 26 mm Sorin Memo 3D ring annuloplasty via right mini thoracotomy approach   Shortness of breath dyspnea    with exertion   Skin cancer    Sleep apnea    cpap- see ov note in EPIC 05/12/11 for settings    Stroke [facility])    hx of   Stroke [facility])    Past Surgical History:  Procedure Laterality Date   AV FISTULA PLACEMENT Left 08/02/2018   Procedure: ARTERIOVENOUS (AV) FISTULA CREATION RADIOCEPHALIC;  Surgeon: Angelia Mould, MD;  Location: [place];  Service: Vascular;  Laterality: Left;   BLEPHAROPLASTY  [place]  2001, 2004, 2009, 2011   CARDIAC CATHETERIZATION N/A 01/23/2016   Procedure: Right/Left Heart Cath and Coronary Angiography;  Surgeon: [name], MD;  Location: [place] CV LAB;  Service: Cardiovascular;  Laterality: N/A;   CARDIOVERSION N/A 09/07/2017   Procedure: CARDIOVERSION;  Surgeon: [name], MD;  Location: [facility] ENDOSCOPY;  Service: Cardiovascular;  Laterality: N/A;   CARDIOVERSION N/A 09/13/2019   Procedure: CARDIOVERSION;  Surgeon: [name], MD;  Location: [facility] ENDOSCOPY;  Service: Cardiovascular;  Laterality: N/A;   CARDIOVERSION N/A 10/19/2019   Procedure: CARDIOVERSION;  Surgeon: [name], MD;  Location: [facility]  ENDOSCOPY;  Service: Cardiovascular;  Laterality: N/A;   CATARACT EXTRACTION W/ INTRAOCULAR LENS IMPLANT Bilateral    CHONDROPLASTY  07/22/2011   Procedure: CHONDROPLASTY;  Surgeon: [name];  Location: [place];  Service: Orthopedics;;   CIRCUMCISION  [place]-- POST CABG   W/ STENT, last cath 01/07/2012    CORONARY ARTERY BYPASS GRAFT  1995   X3 VESSEL   CORONARY ARTERY BYPASS GRAFT  1995   CORONARY STENT PLACEMENT     CYSTOSCOPY/URETEROSCOPY/HOLMIUM LASER/STENT PLACEMENT Right 01/08/2021   Procedure: CYSTOSCOPY/RETROGRADE/URETEROSCOPY/HOLMIUM LASER/STENT PLACEMENT;  Surgeon: Franchot Gallo, MD;  Location: [facility];  Service: Urology;  Laterality: Right;   KNEE ARTHROSCOPY  07/22/2011   Procedure: ARTHROSCOPY KNEE;  Surgeon: [name];  Location: [place];  Service: Orthopedics;  Laterality: Right;  WITH DEBRIDEMENT    KNEE ARTHROSCOPY W/ MENISCECTOMY  X2 IN 2002-- RIGHT KNEE   LAPAROSCOPIC GASTRIC BANDING  01-15-09   LEFT HEART CATH AND CORONARY ANGIOGRAPHY N/A 04/29/2017   Procedure: LEFT HEART CATH AND CORONARY ANGIOGRAPHY;  Surgeon: [name], MD;  Location: [place] CV LAB;  Service: Cardiovascular;  Laterality:  N/A;   LEFT HEART CATHETERIZATION WITH CORONARY ANGIOGRAM N/A 09/11/2013   Procedure: LEFT HEART CATHETERIZATION WITH CORONARY ANGIOGRAM;  Surgeon: [name], MD;  Location: [facility] CATH LAB;  Service: Cardiovascular;  Laterality: N/A;   MITRAL VALVE REPAIR Right 03/25/2016   Procedure: MINIMALLY INVASIVE REOPERATION FOR MITRAL VALVE REPAIR  (MVR) with size 26 Sorin Memo 3D;  Surgeon: [name], MD;  Location: [place];  Service: Open Heart Surgery;  Laterality: Right;   MITRAL VALVE REPAIR  03/2016   RIGHT KNEE ED COMPARTMENT REPLACEMENT  2004   SYNOVECTOMY  07/22/2011   Procedure: SYNOVECTOMY;  Surgeon: Dione Plover Aluisio;  Location: [place];  Service: Orthopedics;;   TEE WITHOUT CARDIOVERSION N/A 01/23/2016   Procedure: TRANSESOPHAGEAL ECHOCARDIOGRAM (TEE);  Surgeon: [name], MD;  Location: [place];  Service: Cardiovascular;  Laterality: N/A;   TEE WITHOUT CARDIOVERSION N/A 03/25/2016   Procedure: TRANSESOPHAGEAL ECHOCARDIOGRAM (TEE);  Surgeon: [name], MD;  Location: [place];  Service: Open Heart Surgery;  Laterality: N/A;   UMBILICAL HERNIA REPAIR  01-15-09   W/ GASTRIC BANDING PROCEDURE   Social History   Socioeconomic History   Marital status: Married    Spouse name: Not on file   Number of children: 1   Years of education: Not on file   Highest education level: Not on file  Occupational History   Chief Strategy Officer, semi-retired  Social Needs   Financial resource strain: Not on file   Food insecurity:    Worry: Not on file    Inability: Not on file   Transportation needs:    Medical: Not on file    Non-medical: Not on file  Tobacco Use   Smoking status: Never Smoker   Smokeless tobacco: Never Used  Substance and Sexual Activity   Alcohol use: Yes, liquor    Comment: OCCASIONAL   Drug use: No   Sexual activity: Not on file  Lifestyle   Physical activity:    Days per week: Not on file    Minutes per session: Not on file   Stress: Not on file   Relationships   Social connections:    Talks on phone: Not on file    Gets together: Not on file  Attends religious service: Not on file    Active member of club or organization: Not on file    Attends meetings of clubs or organizations: Not on file    Relationship status: Not on file   Intimate partner violence:    Fear of current or ex partner: Not on file    Emotionally abused: Not on file    Physically abused: Not on file    Forced sexual activity: Not on file  Other Topics Concern   Not on file  Social History Narrative   Not on file   Current Outpatient Medications on File Prior to Visit  Medication Sig Dispense Refill   Alirocumab (PRALUENT) 75 MG/ML SOAJ Inject 75 mg into the skin every 14 (fourteen) days.     allopurinol (ZYLOPRIM) 100 MG tablet Take 0.5 tablets (50 mg total) by mouth 2 (two) times a week. Please discuss dosing with your PCP based on your renal function. 4 tablet 0   amiodarone (PACERONE) 200 MG tablet Take 1 tablet (200 mg total) by mouth daily. 60 tablet 0   apixaban (ELIQUIS) 5 MG TABS tablet Take 1 tablet (5 mg total) by mouth 2 (two) times daily. 180 tablet 3   Ascorbic Acid (VITAMIN C WITH ROSE HIPS) 1000 MG tablet Take 1,000 mg by mouth daily.     aspirin EC 81 MG EC tablet Take 1 tablet (81 mg total) by mouth daily. Swallow whole. 30 tablet 11   calcitRIOL (ROCALTROL) 0.25 MCG capsule Take 0.25 mcg by mouth every other day.     cetirizine (ZYRTEC) 10 MG tablet Take 10 mg by mouth daily.     colchicine 0.6 MG tablet Take 0.6-1.2 mg by mouth 2 (two) times daily as needed (gout).      fluticasone (FLONASE) 50 MCG/ACT nasal spray Place 1 spray into both nostrils daily as needed for allergies or rhinitis.     HYDROcodone-acetaminophen (NORCO/VICODIN) 5-325 MG tablet Take 1 tablet by mouth daily as needed for severe pain.     nitroGLYCERIN (NITROSTAT) 0.4 MG SL tablet 1 TAB UNDER THE TONGUE EVERY 5 MINUTES AS NEEDED FOR CHEST PAIN UP TO 3 DOSES, IF  PERSIST CALL 911 75 tablet 1   pantoprazole (PROTONIX) 40 MG tablet Take 40 mg by mouth daily as needed (heartburn).     Semaglutide,0.25 or 0.5MG /DOS, (OZEMPIC, 0.25 OR 0.5 MG/DOSE,) 2 MG/1.5ML SOPN Inject 0.375 mLs (0.5 mg total) into the skin every Thursday. 3 pen 3   tadalafil (CIALIS) 20 MG tablet TAKE 1 TABLET BY MOUTH ONCE DAILY AS NEEDED 10 tablet 0   traZODone (DESYREL) 100 MG tablet Take 100 mg by mouth at bedtime.     No current facility-administered medications on file prior to visit.   Allergies  Allergen Reactions   Crestor [Rosuvastatin] Other (See Comments)    Muscle aches    Lipitor [Atorvastatin] Other (See Comments)    Muscle aches   Pravastatin Other (See Comments)    Muscle aches   Carvedilol     Other reaction(s): loss of appetite   Metformin Diarrhea   Serotonin Reuptake Inhibitors (Ssris)     Other reaction(s): Muscle aches   Zocor [Simvastatin]     Other reaction(s): Muscle pain   Codeine Itching and Other (See Comments)    Extremity tingling-- can take synthetic   Family History  Problem Relation Age of Onset   Other Mother        joint problems   Alzheimer's disease Mother  Hypertension Father    Heart disease Father    CVA Father        age 66   Depression Father    Heart disease Sister        CABG   Stroke Sister    Heart failure Sister        congestive   Diabetes Sister    Heart disease Brother    Hypertension Brother    Congestive Heart Failure Brother     PE: BP 118/82   Pulse 64   Ht 5\' 10"  (6.761 m)   Wt 171 lb (77.6 kg)   SpO2 98%   BMI 24.54 kg/m  Wt Readings from Last 3 Encounters:  02/13/21 171 lb (77.6 kg)  02/05/21 183 lb 6.4 oz (83.2 kg)  02/01/21 170 lb (77.1 kg)   Constitutional: overweight, in NAD Eyes: PERRLA, EOMI, no exophthalmos ENT: moist mucous membranes, no thyromegaly, no cervical lymphadenopathy Cardiovascular: RRR, No MRG Respiratory: CTA B Gastrointestinal: abdomen soft, NT, ND,  BS+ Musculoskeletal: no deformities, strength intact in all 4 Skin: moist, warm, no rashes Neurological: no tremor with outstretched hands, DTR normal in all 4  ASSESSMENT: 1. DM2, non-insulin-dependent, uncontrolled, with long-term complications - CAD, s/p coronaroplasty, then CABG 1995 - cardiologist - Dr. Aundra Dubin - CHF, EF 48 to 65% - Atrial fibrillation, cardioversion in 2019 - Carotid artery disease - Cerebrovascular disease, s/p TIA - CKD stage V, pending dialysis -Dr. Jimmy Footman - ED  2. HL  3.  Obesity class I  PLAN:  1. Patient with longstanding, previously uncontrolled type 2 diabetes, with improved control more recently. He had to come off metformin in the past due to declining kidney function and at last OV, we stopped the sulfonylurea due to good control.  HbA1c was excellent, at 6.0%, and he had a recent HbA1c which was higher earlier this month, at 6.2%, but still at goal, despite stopping glipizide and continuing only on Ozempic. -At today's visit, sugars are at goal or slightly above normal goal in the morning and he is not checking sugars later in the day.  I again advised him to start checking some sugars later in the day.  Based on the latest HbA1c, sugars remain at goal throughout the day.  He is wondering whether he can take glipizide before a larger meal and I advised him that this would be ok, but consider to take half a tablet to 1 tablet, depending on the size of the meal. - I suggested to:  Patient Instructions  Please continue: - Ozempic 0.5 mg weekly   OK to take Glipizide 2.5-5 mg before a larger dinner.  Check some sugars at night, also.  Please return in 4 months with your sugar log.   - advised to check sugars at different times of the day - 1x a day, rotating check times - advised for yearly eye exams >> he is UTD - return to clinic in 4 months  2. HL -Reviewed latest lipid panel from earlier this month: All fractions at goal: Lab Results   Component Value Date   CHOL 115 02/02/2021   HDL 46 02/02/2021   LDLCALC 50 02/02/2021   TRIG 94 02/02/2021   CHOLHDL 2.5 02/02/2021  -He had side effects from statins in the past, currently tolerating well Zetia and Praluent  3.  Obesity class I -Continues Ozempic which should also help with weight loss -Weight was stable at last visit, previously lost 12 pounds. -At last visit, he weighed  193 pounds.  Since then, he lost 22 pounds!  He does not feel that these are related to Ozempic.  Philemon Kingdom, MD PhD [facility]

## 2021-02-14 DIAGNOSIS — N185 Chronic kidney disease, stage 5: Secondary | ICD-10-CM | POA: Diagnosis not present

## 2021-02-17 ENCOUNTER — Other Ambulatory Visit: Payer: Self-pay | Admitting: *Deleted

## 2021-02-17 DIAGNOSIS — R531 Weakness: Secondary | ICD-10-CM | POA: Diagnosis not present

## 2021-02-17 DIAGNOSIS — I6300 Cerebral infarction due to thrombosis of unspecified precerebral artery: Secondary | ICD-10-CM | POA: Diagnosis not present

## 2021-02-17 NOTE — Patient Outreach (Signed)
[facility]) Care Management  02/17/2021  [name] [facility] 1946/03/05 [id number]   RED ON EMMI ALERT - STroke Day # 9 Date: 6/23 Red Alert Reason: Questions/Problems about medications   Outreach attempt #1, unsuccessful to both listed numbers, HIPAA compliant voice message left.    Plan: RN CM will send outreach letter and follow up within the next 3-4 business days.  Valente David, [place], MSN [place] [phone]

## 2021-02-18 ENCOUNTER — Other Ambulatory Visit: Payer: Self-pay | Admitting: *Deleted

## 2021-02-18 DIAGNOSIS — I4891 Unspecified atrial fibrillation: Secondary | ICD-10-CM | POA: Diagnosis not present

## 2021-02-18 DIAGNOSIS — I120 Hypertensive chronic kidney disease with stage 5 chronic kidney disease or end stage renal disease: Secondary | ICD-10-CM | POA: Diagnosis not present

## 2021-02-18 DIAGNOSIS — E1122 Type 2 diabetes mellitus with diabetic chronic kidney disease: Secondary | ICD-10-CM | POA: Diagnosis not present

## 2021-02-18 DIAGNOSIS — N200 Calculus of kidney: Secondary | ICD-10-CM | POA: Diagnosis not present

## 2021-02-18 DIAGNOSIS — N2581 Secondary hyperparathyroidism of renal origin: Secondary | ICD-10-CM | POA: Diagnosis not present

## 2021-02-18 DIAGNOSIS — I509 Heart failure, unspecified: Secondary | ICD-10-CM | POA: Diagnosis not present

## 2021-02-18 DIAGNOSIS — D631 Anemia in chronic kidney disease: Secondary | ICD-10-CM | POA: Diagnosis not present

## 2021-02-18 DIAGNOSIS — M109 Gout, unspecified: Secondary | ICD-10-CM | POA: Diagnosis not present

## 2021-02-18 DIAGNOSIS — N185 Chronic kidney disease, stage 5: Secondary | ICD-10-CM | POA: Diagnosis not present

## 2021-02-18 NOTE — Patient Outreach (Addendum)
[facility]) Care Management  02/18/2021  [name] Jun 22, 1946 [id number]   Voice message received from member's wife after missed call yesterday.  Call placed to wife, no answer, HIPAA compliant voice message left.  Will follow up within the next 3 days as planned.  Valente David, [place], MSN [place] [phone]

## 2021-02-19 DIAGNOSIS — R531 Weakness: Secondary | ICD-10-CM | POA: Diagnosis not present

## 2021-02-19 DIAGNOSIS — I6300 Cerebral infarction due to thrombosis of unspecified precerebral artery: Secondary | ICD-10-CM | POA: Diagnosis not present

## 2021-02-21 ENCOUNTER — Other Ambulatory Visit: Payer: Self-pay | Admitting: *Deleted

## 2021-02-21 NOTE — Patient Outreach (Signed)
[place] [facility]) Care Management  02/21/2021  [name] [facility] 12-Nov-1945 [id number]   [facility] outreach for EMMI-stroke Referred on 02/17/21  RED ON EMMI ALERT Referral date 02/17/21  Red Alert Reason: questions about medicines? Yes    Insurance: Human resources officer ([facility]) benefit and [place]are  [facility] admissions x 3 ED visits x 3 in the last 6 months    Outreach attempt # 1 successful at [phone]  Patient's wife [name] is able to verify HIPAA identifiers [facility] Care Management RN reviewed and addressed red alert with [name]  Consent: [facility] RN CM reviewed [facility] services with [name] She gave verbal consent for services [facility] telephonic RN CM.   Advised her that there will be further automated EMMI- post discharge calls to assess how the patient is doing following the recent hospitalization Advised her that another call may be received from a nurse if any of their responses were abnormal. She voiced understanding and was appreciative of f/u call.    EMMI:  [name] voiced that they did have questions about pt's medicines He was ordered aspirin 81 mg and is also on Eliquis  They questioned if this would cause excessive bleeding  Explain post stroke anticoagulant/blood thinner usage Encouraged usage until see neurologist for follow up  Encouraged safety with falls and bruises  Encouraged outreach to MD if frank bleeding or increase bruising noted  No frank bleeding at this time  [name] voiced understanding and appreciation for the outreach  The Nephrologist was seen today   Number for the assigned [facility] RN CM confirmed with [name] [facility] RN CM 24 hour nurse call center number provided and encouraged to outreach to any concerns during the weekend and July 4th holiday  Plan: Patient agrees to the care plan and follow up [place] CM will updated assigned [facility] RN CM of outreach to patient   Joelene Millin L. [name], RN, BSN, [place] Coordinator Office number [phone] Mobile number [phone]  Main [facility] number [phone] Fax number [phone]

## 2021-02-23 ENCOUNTER — Ambulatory Visit
Admission: EM | Admit: 2021-02-23 | Discharge: 2021-02-23 | Disposition: A | Discharge status: Home / Self Care | Payer: Medicare Other | Attending: Family Medicine | Admitting: Family Medicine

## 2021-02-23 ENCOUNTER — Other Ambulatory Visit: Payer: Self-pay

## 2021-02-23 DIAGNOSIS — T148XXA Other injury of unspecified body region, initial encounter: Secondary | ICD-10-CM

## 2021-02-23 DIAGNOSIS — R509 Fever, unspecified: Secondary | ICD-10-CM

## 2021-02-23 NOTE — ED Provider Notes (Signed)
[facility]    CSN: [id number] Arrival date & time: 02/23/21  1422      History   Chief Complaint Chief Complaint  Patient presents with   Hip Pain    Right     HPI  75 year old male presents for evaluation the above.  Patient has recently been admitted to the hospital for stroke.  He has been in rehab.  He has suffered multiple falls since June.  Most recent fall was on 6/27.  They cannot provide any details to how he fell.  Patient states that he has poor balance due to the stroke.  Patient is having pain around the lateral hip.  He has extensive bruising of his right leg.  He is able to ambulate.  He states that it is slowly improving.  Pain 4/10 in severity.  Patient's wife states that he has been fatigued and he has been having intermittent fever for days.  Currently afebrile.  No respiratory symptoms.  No other complaints or concerns at this time.   Past Medical History:  Diagnosis Date   Allergic rhinitis    Anxiety    Atrial fibrillation (HCC)    Cancer (HCC)    hx of skin cancers    Chronic kidney disease    Coronary artery disease    a. s/p MI & CABG; b. s/p PCI Diag;  c. Cath 12/2011: LAD diff dzs/small, Diag 75% isr, 90 dist to stent (small), LCX & RCA occluded, VG->OM 40, VG->PDA patent - med rx.   Depression    PTSD   Diabetes mellitus    not on medications   Diastolic CHF, chronic ([place])    a. EF 55-60% by echo 2007   GERD (gastroesophageal reflux disease)    "not anymore" " I had a lap band"   History of diverticulitis of colon    5 YRS AGO   History of pneumonia    2017   History of transient ischemic attack (TIA)    CAROTID DOPPLERS NOV 2011  0-39& BIL. STENOSIS   Hyperlipidemia    Hypertension    Kidney failure    Mitral regurgitation    Myocardial infarction [facility])    Neuromuscular disorder (HCC)    left arm numbness    OA (osteoarthritis)    RIGHT KNEE ARTHOFIBROSIS W/ PAIN  (S/P  REPLACEMENT 2004)   PTSD (post-traumatic stress  disorder)    from [place] since 1973   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/25/2016   Complex valvuloplasty including artificial Gore-tex neochord placement x4, plication of anterior commissure, and 26 mm Sorin Memo 3D ring annuloplasty via right mini thoracotomy approach   Shortness of breath dyspnea    with exertion   Skin cancer    Sleep apnea    cpap- see ov note in EPIC 05/12/11 for settings    Stroke [facility])    hx of   Stroke [facility])     Patient Active Problem List   Diagnosis Date Noted   Protein-calorie malnutrition, severe 02/04/2021   Cerebral thrombosis with cerebral infarction 02/02/2021   Right sided weakness 02/01/2021   History of COVID-19 01/29/2021   Memory loss 01/29/2021   Olfactory impairment 01/29/2021   Gait disturbance 01/29/2021   Fatigue 01/29/2021   Physical deconditioning 01/29/2021   Pressure injury of skin 01/07/2021   AKI (acute kidney injury) ([place]) 01/06/2021   Generalized weakness 01/06/2021   ARF (acute renal failure) ([place]) 01/06/2021   Dyspnea 10/05/2020   CHF (congestive heart  failure) ([place]) 10/04/2020   COVID-19 virus infection 05/24/2020   Chronic kidney disease    Coronary artery disease    Type II diabetes mellitus ([place])    Hypertension    Cancer ([place])    Encounter for therapeutic drug monitoring 04/03/2016   Pneumothorax on right 03/29/2016   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/25/2016   Atherosclerotic heart disease of native coronary artery without angina pectoris 03/10/2016   Heart failure ([place]) 03/10/2016   Cataract extraction status of eye 03/10/2016   Other long term (current) drug therapy 03/10/2016   Osteoarthritis of knee 03/10/2016   Vitamin D deficiency 03/10/2016   Mitral regurgitation    Avitaminosis D 12/25/2015   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   Arthritis of knee, degenerative 12/25/2015   H/O cataract extraction 12/25/2015   Arteriosclerosis of coronary artery 12/25/2015   Congestive heart  failure ([place]) 12/25/2015   Pure hypercholesterolemia 12/25/2015   Allergic rhinitis 12/25/2015   Essential (primary) hypertension 12/25/2015   Hypotension 01/29/2014   Expected Blood loss, postoperative 04/29/2012   Pain due to unicompartmental arthroplasty of knee ([place]) 04/27/2012   Preoperative evaluation of a medical condition to rule out surgical contraindications (TAR required) 70/96/2836   Diastolic CHF, chronic ([place]) 06/29/2011   Preoperative evaluation to rule out surgical contraindication 06/29/2011   CAROTID ARTERY STENOSIS 07/09/2010   TIA 05/29/2010   FATIGUE / MALAISE 05/29/2010   CAD, AUTOLOGOUS BYPASS GRAFT 11/15/2008   MYOCARDIAL INFARCTION 12/18/2007   ALLERGIC RHINITIS WITH CONJUNCTIVITIS 12/18/2007   Poorly controlled type 2 diabetes mellitus with circulatory disorder ([place]) 12/05/2007   Hyperlipidemia 12/05/2007   OBESITY 12/05/2007   Essential hypertension 12/05/2007   Coronary atherosclerosis 12/05/2007   DEGENERATIVE JOINT DISEASE 12/05/2007   Obstructive sleep apnea 12/05/2007    Past Surgical History:  Procedure Laterality Date   AV FISTULA PLACEMENT Left 08/02/2018   Procedure: ARTERIOVENOUS (AV) FISTULA CREATION RADIOCEPHALIC;  Surgeon: Angelia Mould, MD;  Location: [facility] OR;  Service: Vascular;  Laterality: Left;   BLEPHAROPLASTY  [place]  2001, 2004, 2009, 2011   CARDIAC CATHETERIZATION N/A 01/23/2016   Procedure: Right/Left Heart Cath and Coronary Angiography;  Surgeon: [name], MD;  Location: [place] CV LAB;  Service: Cardiovascular;  Laterality: N/A;   CARDIOVERSION N/A 09/07/2017   Procedure: CARDIOVERSION;  Surgeon: [name], MD;  Location: [facility] ENDOSCOPY;  Service: Cardiovascular;  Laterality: N/A;   CARDIOVERSION N/A 09/13/2019   Procedure: CARDIOVERSION;  Surgeon: [name], MD;  Location: [facility] ENDOSCOPY;  Service: Cardiovascular;  Laterality: N/A;   CARDIOVERSION N/A 10/19/2019    Procedure: CARDIOVERSION;  Surgeon: [name], MD;  Location: [facility] ENDOSCOPY;  Service: Cardiovascular;  Laterality: N/A;   CATARACT EXTRACTION W/ INTRAOCULAR LENS IMPLANT Bilateral    CHONDROPLASTY  07/22/2011   Procedure: CHONDROPLASTY;  Surgeon: Dione Plover Aluisio;  Location: [place];  Service: Orthopedics;;   CIRCUMCISION  [place]-- POST CABG   W/ STENT, last cath 01/07/2012    CORONARY ARTERY BYPASS GRAFT  1995   X3 VESSEL   CORONARY ARTERY BYPASS GRAFT  1995   CORONARY STENT PLACEMENT     CYSTOSCOPY/URETEROSCOPY/HOLMIUM LASER/STENT PLACEMENT Right 01/08/2021   Procedure: CYSTOSCOPY/RETROGRADE/URETEROSCOPY/HOLMIUM LASER/STENT PLACEMENT;  Surgeon: Franchot Gallo, MD;  Location: [facility] ORS;  Service: Urology;  Laterality: Right;   KNEE ARTHROSCOPY  07/22/2011   Procedure: ARTHROSCOPY KNEE;  Surgeon: Dione Plover  Aluisio;  Location: [place];  Service: Orthopedics;  Laterality: Right;  WITH DEBRIDEMENT    KNEE ARTHROSCOPY W/ MENISCECTOMY  X2 IN 2002-- RIGHT KNEE   LAPAROSCOPIC GASTRIC BANDING  01-15-09   LEFT HEART CATH AND CORONARY ANGIOGRAPHY N/A 04/29/2017   Procedure: LEFT HEART CATH AND CORONARY ANGIOGRAPHY;  Surgeon: [name], MD;  Location: [place] CV LAB;  Service: Cardiovascular;  Laterality: N/A;   LEFT HEART CATHETERIZATION WITH CORONARY ANGIOGRAM N/A 09/11/2013   Procedure: LEFT HEART CATHETERIZATION WITH CORONARY ANGIOGRAM;  Surgeon: [name], MD;  Location: [facility] CATH LAB;  Service: Cardiovascular;  Laterality: N/A;   MITRAL VALVE REPAIR Right 03/25/2016   Procedure: MINIMALLY INVASIVE REOPERATION FOR MITRAL VALVE REPAIR  (MVR) with size 26 Sorin Memo 3D;  Surgeon: [name], MD;  Location: [place];  Service: Open Heart Surgery;  Laterality: Right;   MITRAL VALVE REPAIR  03/2016   RIGHT KNEE ED COMPARTMENT REPLACEMENT  2004   SYNOVECTOMY  07/22/2011   Procedure: SYNOVECTOMY;   Surgeon: Dione Plover Aluisio;  Location: [place];  Service: Orthopedics;;   TEE WITHOUT CARDIOVERSION N/A 01/23/2016   Procedure: TRANSESOPHAGEAL ECHOCARDIOGRAM (TEE);  Surgeon: [name], MD;  Location: [place];  Service: Cardiovascular;  Laterality: N/A;   TEE WITHOUT CARDIOVERSION N/A 03/25/2016   Procedure: TRANSESOPHAGEAL ECHOCARDIOGRAM (TEE);  Surgeon: [name], MD;  Location: [place];  Service: Open Heart Surgery;  Laterality: N/A;   UMBILICAL HERNIA REPAIR  01-15-09   W/ GASTRIC BANDING PROCEDURE       Home Medications    Prior to Admission medications   Medication Sig Start Date End Date Taking? Authorizing Provider  Alirocumab (PRALUENT) 75 MG/ML SOAJ Inject 75 mg into the skin every 14 (fourteen) days.   Yes [provider]  amiodarone (PACERONE) 200 MG tablet Take 1 tablet (200 mg total) by mouth daily. 10/19/19  Yes [name], MD  apixaban (ELIQUIS) 5 MG TABS tablet Take 1 tablet (5 mg total) by mouth 2 (two) times daily. 09/09/17  Yes [name], MD  Ascorbic Acid (VITAMIN C WITH ROSE HIPS) 1000 MG tablet Take 1,000 mg by mouth daily.   Yes [provider]  aspirin EC 81 MG EC tablet Take 1 tablet (81 mg total) by mouth daily. Swallow whole. 02/04/21  Yes Charlynne Cousins, MD  calcitRIOL (ROCALTROL) 0.25 MCG capsule Take 0.25 mcg by mouth every other day.   Yes [provider]  cetirizine (ZYRTEC) 10 MG tablet Take 10 mg by mouth daily.   Yes [provider]  colchicine 0.6 MG tablet Take 0.6-1.2 mg by mouth 2 (two) times daily as needed (gout).  04/14/19  Yes [provider]  fluticasone (FLONASE) 50 MCG/ACT nasal spray Place 1 spray into both nostrils daily as needed for allergies or rhinitis.   Yes [provider]  HYDROcodone-acetaminophen (NORCO/VICODIN) 5-325 MG tablet Take 1 tablet by mouth daily as needed for severe pain.   Yes [provider]  nitroGLYCERIN  (NITROSTAT) 0.4 MG SL tablet 1 TAB UNDER THE TONGUE EVERY 5 MINUTES AS NEEDED FOR CHEST PAIN UP TO 3 DOSES, IF PERSIST CALL 911 03/05/20  Yes [name], MD  pantoprazole (PROTONIX) 40 MG tablet Take 40 mg by mouth daily as needed (heartburn).   Yes [provider]  Semaglutide,0.25 or 0.5MG /DOS, (OZEMPIC, 0.25 OR 0.5 MG/DOSE,) 2 MG/1.5ML SOPN Inject 0.375 mLs (0.5 mg total) into the skin every Thursday.  02/01/20  Yes Philemon Kingdom, MD  tadalafil (CIALIS) 20 MG tablet TAKE 1 TABLET BY MOUTH ONCE DAILY AS NEEDED 01/08/20  Yes McKenzie, Candee Furbish, MD  traZODone (DESYREL) 100 MG tablet Take 100 mg by mouth at bedtime.   Yes [provider]  allopurinol (ZYLOPRIM) 100 MG tablet Take 0.5 tablets (50 mg total) by mouth 2 (two) times a week. Please discuss dosing with your PCP based on your renal function. 01/13/21 02/12/21  [name], MD    Family History Family History  Problem Relation Age of Onset   Other Mother        joint problems   Alzheimer's disease Mother    Hypertension Father    Heart disease Father    CVA Father        age 44   Depression Father    Heart disease Sister        CABG   Stroke Sister    Heart failure Sister        congestive   Diabetes Sister    Heart disease Brother    Hypertension Brother    Congestive Heart Failure Brother     Social History Social History   Tobacco Use   Smoking status: Never   Smokeless tobacco: Never  Vaping Use   Vaping Use: Never used  Substance Use Topics   Alcohol use: Not Currently    Alcohol/week: 2.0 standard drinks    Types: 1 Glasses of wine, 1 Shots of liquor per week    Comment: OCCASIONAL   Drug use: Never     Allergies   Crestor [rosuvastatin], Lipitor [atorvastatin], Pravastatin, Carvedilol, Metformin, Serotonin reuptake inhibitors (ssris), Zocor [simvastatin], and Codeine   Review of Systems Review of Systems Per HPI  Physical Exam Triage Vital Signs ED Triage Vitals   Enc Vitals Group     BP 02/23/21 1455 (!) 91/54     Pulse --      Resp 02/23/21 1455 16     Temp 02/23/21 1455 (!) 97.5 F (36.4 C)     Temp Source 02/23/21 1455 Oral     SpO2 02/23/21 1455 93 %     Weight --      Height --      Head Circumference --      Peak Flow --      Pain Score 02/23/21 1457 4     Pain Loc --      Pain Edu? --      Excl. in [place]? --    Updated Vital Signs BP (!) 91/54 (BP Location: Right Arm)   Temp (!) 97.5 F (36.4 C) (Oral)   Resp 16   SpO2 93%   Visual Acuity Right Eye Distance:   Left Eye Distance:   Bilateral Distance:    Right Eye Near:   Left Eye Near:    Bilateral Near:     Physical Exam Vitals and nursing note reviewed.  Constitutional:      General: He is not in acute distress.    Appearance: Normal appearance.  HENT:     Head: Normocephalic and atraumatic.  Eyes:     General:        Right eye: No discharge.        Left eye: No discharge.     Conjunctiva/sclera: Conjunctivae normal.  Cardiovascular:     Rate and Rhythm: Normal rate and regular rhythm.  Pulmonary:     Effort: Pulmonary effort is normal. No respiratory  distress.     Breath sounds: Normal breath sounds.  Skin:    Comments: Hematoma and extensive bruising noted to the right lateral hip and buttock.  Patient also has extensive bruising and a healing wound to the right knee.  Additionally, patient has extensive bruising of the right lower extremity extending down to the toes.  Normal gait.  Neurological:     Mental Status: He is alert.  Psychiatric:        Mood and Affect: Mood normal.        Behavior: Behavior normal.     UC Treatments / Results  Labs (all labs ordered are listed, but only abnormal results are displayed) Labs Reviewed - No data to display  EKG   Radiology No results found.  Procedures Procedures (including critical care time)  Medications Ordered in UC Medications - No data to display  Initial Impression / Assessment and Plan /  UC Course  I have reviewed the triage vital signs and the nursing notes.  Pertinent labs & imaging results that were available during my care of the patient were reviewed by me and considered in my medical decision making (see chart for details).     75 year old male with an extensive past medical history presents with extensive bruising to his right leg.  He is primary concerned about the right hip region.  This appears to be consistent with hematoma.  We discussed imaging and we elected not to proceed as I do not suspect fracture.  Wife also reports that he has had intermittent fever for the past several days.  He has no respiratory symptoms.  He is currently afebrile.  I have advised her that given his extensive comorbidities and recent hospitalization he develops fever again, he should go to the hospital for further evaluation and management.  Of note, oxygen saturations with the devices at this facility appear to run on the lower side of normal.  Final Clinical Impressions(s) / UC Diagnoses   Final diagnoses:  Hematoma  Bruising  Fever, unspecified fever cause     Discharge Instructions      Keep a close eye.  If he develops fever again, he needs to go to the hospital.  Take care  Dr. Lacinda Axon    ED Prescriptions   None    PDMP not reviewed this encounter.   Coral Spikes, [place] 02/23/21 1601

## 2021-02-23 NOTE — Discharge Instructions (Addendum)
Keep a close eye.  If he develops fever again, he needs to go to the hospital.  Take care  Dr. Lacinda Axon

## 2021-02-23 NOTE — ED Triage Notes (Signed)
Patient presents today with right hip pain from fall on 02/17/21. Patient states he wants to make sure nothing is seriously wrong.

## 2021-02-25 DIAGNOSIS — M109 Gout, unspecified: Secondary | ICD-10-CM | POA: Insufficient documentation

## 2021-02-25 DIAGNOSIS — K036 Deposits [accretions] on teeth: Secondary | ICD-10-CM | POA: Insufficient documentation

## 2021-02-25 DIAGNOSIS — F32A Depression, unspecified: Secondary | ICD-10-CM | POA: Insufficient documentation

## 2021-02-25 DIAGNOSIS — K219 Gastro-esophageal reflux disease without esophagitis: Secondary | ICD-10-CM | POA: Insufficient documentation

## 2021-02-25 DIAGNOSIS — F4310 Post-traumatic stress disorder, unspecified: Secondary | ICD-10-CM | POA: Insufficient documentation

## 2021-02-25 DIAGNOSIS — D0439 Carcinoma in situ of skin of other parts of face: Secondary | ICD-10-CM | POA: Insufficient documentation

## 2021-02-25 DIAGNOSIS — F528 Other sexual dysfunction not due to a substance or known physiological condition: Secondary | ICD-10-CM | POA: Insufficient documentation

## 2021-02-25 DIAGNOSIS — K279 Peptic ulcer, site unspecified, unspecified as acute or chronic, without hemorrhage or perforation: Secondary | ICD-10-CM | POA: Insufficient documentation

## 2021-02-25 DIAGNOSIS — I4891 Unspecified atrial fibrillation: Secondary | ICD-10-CM | POA: Insufficient documentation

## 2021-02-25 DIAGNOSIS — Z85828 Personal history of other malignant neoplasm of skin: Secondary | ICD-10-CM | POA: Insufficient documentation

## 2021-02-25 DIAGNOSIS — N529 Male erectile dysfunction, unspecified: Secondary | ICD-10-CM | POA: Insufficient documentation

## 2021-02-25 DIAGNOSIS — C44311 Basal cell carcinoma of skin of nose: Secondary | ICD-10-CM | POA: Insufficient documentation

## 2021-02-25 DIAGNOSIS — F411 Generalized anxiety disorder: Secondary | ICD-10-CM | POA: Insufficient documentation

## 2021-02-25 DIAGNOSIS — R531 Weakness: Secondary | ICD-10-CM | POA: Diagnosis not present

## 2021-02-25 DIAGNOSIS — Z961 Presence of intraocular lens: Secondary | ICD-10-CM | POA: Insufficient documentation

## 2021-02-25 DIAGNOSIS — C44320 Squamous cell carcinoma of skin of unspecified parts of face: Secondary | ICD-10-CM | POA: Insufficient documentation

## 2021-02-25 DIAGNOSIS — L988 Other specified disorders of the skin and subcutaneous tissue: Secondary | ICD-10-CM | POA: Insufficient documentation

## 2021-02-25 DIAGNOSIS — L570 Actinic keratosis: Secondary | ICD-10-CM | POA: Insufficient documentation

## 2021-02-25 DIAGNOSIS — Z4802 Encounter for removal of sutures: Secondary | ICD-10-CM | POA: Insufficient documentation

## 2021-02-25 DIAGNOSIS — K0262 Dental caries on smooth surface penetrating into dentin: Secondary | ICD-10-CM | POA: Insufficient documentation

## 2021-02-25 DIAGNOSIS — C44211 Basal cell carcinoma of skin of unspecified ear and external auricular canal: Secondary | ICD-10-CM | POA: Insufficient documentation

## 2021-02-25 DIAGNOSIS — Z461 Encounter for fitting and adjustment of hearing aid: Secondary | ICD-10-CM | POA: Insufficient documentation

## 2021-02-25 DIAGNOSIS — H903 Sensorineural hearing loss, bilateral: Secondary | ICD-10-CM | POA: Insufficient documentation

## 2021-02-25 DIAGNOSIS — M159 Polyosteoarthritis, unspecified: Secondary | ICD-10-CM | POA: Insufficient documentation

## 2021-02-25 DIAGNOSIS — Z03818 Encounter for observation for suspected exposure to other biological agents ruled out: Secondary | ICD-10-CM | POA: Diagnosis not present

## 2021-02-25 DIAGNOSIS — N401 Enlarged prostate with lower urinary tract symptoms: Secondary | ICD-10-CM | POA: Insufficient documentation

## 2021-02-25 DIAGNOSIS — K5732 Diverticulitis of large intestine without perforation or abscess without bleeding: Secondary | ICD-10-CM | POA: Insufficient documentation

## 2021-02-25 DIAGNOSIS — R509 Fever, unspecified: Secondary | ICD-10-CM | POA: Diagnosis not present

## 2021-02-26 ENCOUNTER — Inpatient Hospital Stay (HOSPITAL_COMMUNITY): Payer: No Typology Code available for payment source

## 2021-02-26 ENCOUNTER — Other Ambulatory Visit: Payer: Self-pay

## 2021-02-26 ENCOUNTER — Encounter (HOSPITAL_COMMUNITY): Payer: Self-pay

## 2021-02-26 ENCOUNTER — Inpatient Hospital Stay (HOSPITAL_COMMUNITY)
Admission: EM | Admit: 2021-02-26 | Discharge: 2021-03-07 | DRG: 314 | Disposition: A | Discharge status: Home Health | Payer: No Typology Code available for payment source | Attending: Internal Medicine | Admitting: Internal Medicine

## 2021-02-26 ENCOUNTER — Emergency Department (HOSPITAL_COMMUNITY): Payer: No Typology Code available for payment source

## 2021-02-26 DIAGNOSIS — M02362 Reiter's disease, left knee: Secondary | ICD-10-CM | POA: Diagnosis present

## 2021-02-26 DIAGNOSIS — I38 Endocarditis, valve unspecified: Secondary | ICD-10-CM | POA: Diagnosis not present

## 2021-02-26 DIAGNOSIS — Z85828 Personal history of other malignant neoplasm of skin: Secondary | ICD-10-CM | POA: Diagnosis not present

## 2021-02-26 DIAGNOSIS — R600 Localized edema: Secondary | ICD-10-CM | POA: Diagnosis not present

## 2021-02-26 DIAGNOSIS — Z452 Encounter for adjustment and management of vascular access device: Secondary | ICD-10-CM | POA: Diagnosis not present

## 2021-02-26 DIAGNOSIS — I482 Chronic atrial fibrillation, unspecified: Secondary | ICD-10-CM | POA: Diagnosis present

## 2021-02-26 DIAGNOSIS — Z961 Presence of intraocular lens: Secondary | ICD-10-CM | POA: Diagnosis present

## 2021-02-26 DIAGNOSIS — M5127 Other intervertebral disc displacement, lumbosacral region: Secondary | ICD-10-CM | POA: Diagnosis not present

## 2021-02-26 DIAGNOSIS — K219 Gastro-esophageal reflux disease without esophagitis: Secondary | ICD-10-CM | POA: Diagnosis present

## 2021-02-26 DIAGNOSIS — I959 Hypotension, unspecified: Secondary | ICD-10-CM | POA: Diagnosis not present

## 2021-02-26 DIAGNOSIS — Z20822 Contact with and (suspected) exposure to covid-19: Secondary | ICD-10-CM | POA: Diagnosis present

## 2021-02-26 DIAGNOSIS — Y831 Surgical operation with implant of artificial internal device as the cause of abnormal reaction of the patient, or of later complication, without mention of misadventure at the time of the procedure: Secondary | ICD-10-CM | POA: Diagnosis present

## 2021-02-26 DIAGNOSIS — M549 Dorsalgia, unspecified: Secondary | ICD-10-CM | POA: Diagnosis present

## 2021-02-26 DIAGNOSIS — I330 Acute and subacute infective endocarditis: Secondary | ICD-10-CM | POA: Diagnosis present

## 2021-02-26 DIAGNOSIS — Z7982 Long term (current) use of aspirin: Secondary | ICD-10-CM

## 2021-02-26 DIAGNOSIS — I25810 Atherosclerosis of coronary artery bypass graft(s) without angina pectoris: Secondary | ICD-10-CM | POA: Diagnosis present

## 2021-02-26 DIAGNOSIS — S20229A Contusion of unspecified back wall of thorax, initial encounter: Secondary | ICD-10-CM | POA: Diagnosis present

## 2021-02-26 DIAGNOSIS — E1122 Type 2 diabetes mellitus with diabetic chronic kidney disease: Secondary | ICD-10-CM | POA: Diagnosis not present

## 2021-02-26 DIAGNOSIS — Z833 Family history of diabetes mellitus: Secondary | ICD-10-CM

## 2021-02-26 DIAGNOSIS — Z8249 Family history of ischemic heart disease and other diseases of the circulatory system: Secondary | ICD-10-CM

## 2021-02-26 DIAGNOSIS — M21371 Foot drop, right foot: Secondary | ICD-10-CM | POA: Diagnosis present

## 2021-02-26 DIAGNOSIS — I2510 Atherosclerotic heart disease of native coronary artery without angina pectoris: Secondary | ICD-10-CM | POA: Diagnosis present

## 2021-02-26 DIAGNOSIS — N179 Acute kidney failure, unspecified: Secondary | ICD-10-CM | POA: Diagnosis not present

## 2021-02-26 DIAGNOSIS — I130 Hypertensive heart and chronic kidney disease with heart failure and stage 1 through stage 4 chronic kidney disease, or unspecified chronic kidney disease: Secondary | ICD-10-CM | POA: Diagnosis not present

## 2021-02-26 DIAGNOSIS — T07XXXA Unspecified multiple injuries, initial encounter: Secondary | ICD-10-CM | POA: Diagnosis not present

## 2021-02-26 DIAGNOSIS — W19XXXA Unspecified fall, initial encounter: Secondary | ICD-10-CM | POA: Diagnosis present

## 2021-02-26 DIAGNOSIS — D631 Anemia in chronic kidney disease: Secondary | ICD-10-CM | POA: Diagnosis not present

## 2021-02-26 DIAGNOSIS — Z8673 Personal history of transient ischemic attack (TIA), and cerebral infarction without residual deficits: Secondary | ICD-10-CM | POA: Diagnosis not present

## 2021-02-26 DIAGNOSIS — Z820 Family history of epilepsy and other diseases of the nervous system: Secondary | ICD-10-CM

## 2021-02-26 DIAGNOSIS — Z9884 Bariatric surgery status: Secondary | ICD-10-CM

## 2021-02-26 DIAGNOSIS — Z951 Presence of aortocoronary bypass graft: Secondary | ICD-10-CM | POA: Diagnosis not present

## 2021-02-26 DIAGNOSIS — E785 Hyperlipidemia, unspecified: Secondary | ICD-10-CM | POA: Diagnosis present

## 2021-02-26 DIAGNOSIS — I452 Bifascicular block: Secondary | ICD-10-CM | POA: Diagnosis present

## 2021-02-26 DIAGNOSIS — M25552 Pain in left hip: Secondary | ICD-10-CM | POA: Diagnosis not present

## 2021-02-26 DIAGNOSIS — Z823 Family history of stroke: Secondary | ICD-10-CM

## 2021-02-26 DIAGNOSIS — Z818 Family history of other mental and behavioral disorders: Secondary | ICD-10-CM

## 2021-02-26 DIAGNOSIS — Z9841 Cataract extraction status, right eye: Secondary | ICD-10-CM

## 2021-02-26 DIAGNOSIS — I082 Rheumatic disorders of both aortic and tricuspid valves: Secondary | ICD-10-CM | POA: Diagnosis not present

## 2021-02-26 DIAGNOSIS — Z952 Presence of prosthetic heart valve: Secondary | ICD-10-CM | POA: Diagnosis not present

## 2021-02-26 DIAGNOSIS — B952 Enterococcus as the cause of diseases classified elsewhere: Secondary | ICD-10-CM | POA: Diagnosis not present

## 2021-02-26 DIAGNOSIS — R296 Repeated falls: Secondary | ICD-10-CM | POA: Diagnosis present

## 2021-02-26 DIAGNOSIS — S8002XA Contusion of left knee, initial encounter: Secondary | ICD-10-CM | POA: Diagnosis present

## 2021-02-26 DIAGNOSIS — S80211A Abrasion, right knee, initial encounter: Secondary | ICD-10-CM | POA: Diagnosis present

## 2021-02-26 DIAGNOSIS — N184 Chronic kidney disease, stage 4 (severe): Secondary | ICD-10-CM | POA: Diagnosis present

## 2021-02-26 DIAGNOSIS — Z7901 Long term (current) use of anticoagulants: Secondary | ICD-10-CM

## 2021-02-26 DIAGNOSIS — I6340 Cerebral infarction due to embolism of unspecified cerebral artery: Secondary | ICD-10-CM | POA: Diagnosis not present

## 2021-02-26 DIAGNOSIS — I341 Nonrheumatic mitral (valve) prolapse: Secondary | ICD-10-CM | POA: Diagnosis not present

## 2021-02-26 DIAGNOSIS — R531 Weakness: Secondary | ICD-10-CM | POA: Diagnosis not present

## 2021-02-26 DIAGNOSIS — E7800 Pure hypercholesterolemia, unspecified: Secondary | ICD-10-CM | POA: Diagnosis not present

## 2021-02-26 DIAGNOSIS — R509 Fever, unspecified: Secondary | ICD-10-CM | POA: Diagnosis present

## 2021-02-26 DIAGNOSIS — I5032 Chronic diastolic (congestive) heart failure: Secondary | ICD-10-CM | POA: Diagnosis present

## 2021-02-26 DIAGNOSIS — T826XXA Infection and inflammatory reaction due to cardiac valve prosthesis, initial encounter: Principal | ICD-10-CM | POA: Diagnosis present

## 2021-02-26 DIAGNOSIS — M1712 Unilateral primary osteoarthritis, left knee: Secondary | ICD-10-CM | POA: Diagnosis present

## 2021-02-26 DIAGNOSIS — D500 Iron deficiency anemia secondary to blood loss (chronic): Secondary | ICD-10-CM | POA: Diagnosis not present

## 2021-02-26 DIAGNOSIS — M25462 Effusion, left knee: Secondary | ICD-10-CM | POA: Diagnosis not present

## 2021-02-26 DIAGNOSIS — I252 Old myocardial infarction: Secondary | ICD-10-CM | POA: Diagnosis not present

## 2021-02-26 DIAGNOSIS — T826XXS Infection and inflammatory reaction due to cardiac valve prosthesis, sequela: Secondary | ICD-10-CM | POA: Diagnosis not present

## 2021-02-26 DIAGNOSIS — R4182 Altered mental status, unspecified: Secondary | ICD-10-CM | POA: Diagnosis not present

## 2021-02-26 DIAGNOSIS — M109 Gout, unspecified: Secondary | ICD-10-CM | POA: Diagnosis present

## 2021-02-26 DIAGNOSIS — R7881 Bacteremia: Secondary | ICD-10-CM | POA: Diagnosis not present

## 2021-02-26 DIAGNOSIS — D649 Anemia, unspecified: Secondary | ICD-10-CM | POA: Diagnosis not present

## 2021-02-26 DIAGNOSIS — Z9842 Cataract extraction status, left eye: Secondary | ICD-10-CM

## 2021-02-26 DIAGNOSIS — M545 Low back pain, unspecified: Secondary | ICD-10-CM | POA: Diagnosis not present

## 2021-02-26 DIAGNOSIS — R9431 Abnormal electrocardiogram [ECG] [EKG]: Secondary | ICD-10-CM | POA: Diagnosis present

## 2021-02-26 DIAGNOSIS — R0689 Other abnormalities of breathing: Secondary | ICD-10-CM | POA: Diagnosis not present

## 2021-02-26 DIAGNOSIS — E871 Hypo-osmolality and hyponatremia: Secondary | ICD-10-CM | POA: Diagnosis not present

## 2021-02-26 DIAGNOSIS — M48061 Spinal stenosis, lumbar region without neurogenic claudication: Secondary | ICD-10-CM | POA: Diagnosis not present

## 2021-02-26 DIAGNOSIS — G319 Degenerative disease of nervous system, unspecified: Secondary | ICD-10-CM | POA: Diagnosis not present

## 2021-02-26 DIAGNOSIS — M4807 Spinal stenosis, lumbosacral region: Secondary | ICD-10-CM | POA: Diagnosis present

## 2021-02-26 DIAGNOSIS — M5440 Lumbago with sciatica, unspecified side: Secondary | ICD-10-CM | POA: Diagnosis not present

## 2021-02-26 DIAGNOSIS — M47816 Spondylosis without myelopathy or radiculopathy, lumbar region: Secondary | ICD-10-CM | POA: Diagnosis not present

## 2021-02-26 DIAGNOSIS — E119 Type 2 diabetes mellitus without complications: Secondary | ICD-10-CM

## 2021-02-26 DIAGNOSIS — M5126 Other intervertebral disc displacement, lumbar region: Secondary | ICD-10-CM | POA: Diagnosis not present

## 2021-02-26 DIAGNOSIS — Z79899 Other long term (current) drug therapy: Secondary | ICD-10-CM

## 2021-02-26 DIAGNOSIS — Q211 Atrial septal defect: Secondary | ICD-10-CM | POA: Diagnosis not present

## 2021-02-26 DIAGNOSIS — I10 Essential (primary) hypertension: Secondary | ICD-10-CM | POA: Diagnosis present

## 2021-02-26 DIAGNOSIS — G4733 Obstructive sleep apnea (adult) (pediatric): Secondary | ICD-10-CM | POA: Diagnosis present

## 2021-02-26 DIAGNOSIS — Z955 Presence of coronary angioplasty implant and graft: Secondary | ICD-10-CM

## 2021-02-26 DIAGNOSIS — R0902 Hypoxemia: Secondary | ICD-10-CM | POA: Diagnosis not present

## 2021-02-26 DIAGNOSIS — F4310 Post-traumatic stress disorder, unspecified: Secondary | ICD-10-CM | POA: Diagnosis present

## 2021-02-26 LAB — CBC WITH DIFFERENTIAL/PLATELET
Abs Immature Granulocytes: 0.04 10*3/uL (ref 0.00–0.07)
Basophils Absolute: 0 10*3/uL (ref 0.0–0.1)
Basophils Relative: 0 %
Eosinophils Absolute: 0 10*3/uL (ref 0.0–0.5)
Eosinophils Relative: 0 %
HCT: 25.8 % — ABNORMAL LOW (ref 39.0–52.0)
Hemoglobin: 8.9 g/dL — ABNORMAL LOW (ref 13.0–17.0)
Immature Granulocytes: 0 %
Lymphocytes Relative: 6 %
Lymphs Abs: 0.5 10*3/uL — ABNORMAL LOW (ref 0.7–4.0)
MCH: 34 pg (ref 26.0–34.0)
MCHC: 34.5 g/dL (ref 30.0–36.0)
MCV: 98.5 fL (ref 80.0–100.0)
Monocytes Absolute: 1 10*3/uL (ref 0.1–1.0)
Monocytes Relative: 10 %
Neutro Abs: 7.7 10*3/uL (ref 1.7–7.7)
Neutrophils Relative %: 84 %
Platelets: 164 10*3/uL (ref 150–400)
RBC: 2.62 MIL/uL — ABNORMAL LOW (ref 4.22–5.81)
RDW: 13.3 % (ref 11.5–15.5)
WBC: 9.2 10*3/uL (ref 4.0–10.5)
nRBC: 0 % (ref 0.0–0.2)

## 2021-02-26 LAB — BLOOD CULTURE ID PANEL (REFLEXED) - BCID2

## 2021-02-26 LAB — COMPREHENSIVE METABOLIC PANEL
ALT: 36 U/L (ref 0–44)
AST: 36 U/L (ref 15–41)
Albumin: 2.8 g/dL — ABNORMAL LOW (ref 3.5–5.0)
Alkaline Phosphatase: 94 U/L (ref 38–126)
Anion gap: 8 (ref 5–15)
BUN: 52 mg/dL — ABNORMAL HIGH (ref 8–23)
CO2: 23 mmol/L (ref 22–32)
Calcium: 8.8 mg/dL — ABNORMAL LOW (ref 8.9–10.3)
Chloride: 96 mmol/L — ABNORMAL LOW (ref 98–111)
Creatinine, Ser: 3.17 mg/dL — ABNORMAL HIGH (ref 0.61–1.24)
GFR, Estimated: 20 mL/min — ABNORMAL LOW (ref >60)
Glucose, Bld: 178 mg/dL — ABNORMAL HIGH (ref 70–99)
Potassium: 3.8 mmol/L (ref 3.5–5.1)
Sodium: 127 mmol/L — ABNORMAL LOW (ref 135–145)
Total Bilirubin: 1.1 mg/dL (ref 0.3–1.2)
Total Protein: 5.2 g/dL — ABNORMAL LOW (ref 6.5–8.1)

## 2021-02-26 LAB — URINALYSIS, ROUTINE W REFLEX MICROSCOPIC
Bilirubin Urine: NEGATIVE
Glucose, UA: NEGATIVE mg/dL
Ketones, ur: NEGATIVE mg/dL
Leukocytes,Ua: NEGATIVE
Nitrite: NEGATIVE
Protein, ur: NEGATIVE mg/dL
Specific Gravity, Urine: 1.01 (ref 1.005–1.030)
pH: 5 (ref 5.0–8.0)

## 2021-02-26 LAB — CBG MONITORING, ED
Glucose-Capillary: 130 mg/dL — ABNORMAL HIGH (ref 70–99)
Glucose-Capillary: 100 mg/dL — ABNORMAL HIGH (ref 70–99)
Glucose-Capillary: 127 mg/dL — ABNORMAL HIGH (ref 70–99)

## 2021-02-26 LAB — BRAIN NATRIURETIC PEPTIDE: B Natriuretic Peptide: 762.7 pg/mL — ABNORMAL HIGH (ref 0.0–100.0)

## 2021-02-26 LAB — LACTIC ACID, PLASMA: Lactic Acid, Venous: 0.8 mmol/L (ref 0.5–1.9)

## 2021-02-26 LAB — TROPONIN I (HIGH SENSITIVITY)
Troponin I (High Sensitivity): 27 ng/L — ABNORMAL HIGH (ref <18)
Troponin I (High Sensitivity): 25 ng/L — ABNORMAL HIGH (ref <18)
Troponin I (High Sensitivity): 27 ng/L — ABNORMAL HIGH (ref <18)

## 2021-02-26 LAB — RESP PANEL BY RT-PCR (FLU A&B, COVID) ARPGX2
Influenza A by PCR: NEGATIVE
Influenza B by PCR: NEGATIVE
SARS Coronavirus 2 by RT PCR: NEGATIVE

## 2021-02-26 LAB — GLUCOSE, CAPILLARY: Glucose-Capillary: 166 mg/dL — ABNORMAL HIGH (ref 70–99)

## 2021-02-26 LAB — POC OCCULT BLOOD, ED: Fecal Occult Bld: NEGATIVE

## 2021-02-26 MED ORDER — NITROGLYCERIN 0.4 MG SL SUBL: 0.4000 mg | SUBLINGUAL_TABLET | SUBLINGUAL | Status: DC | PRN

## 2021-02-26 MED ORDER — HYDROCODONE-ACETAMINOPHEN 5-325 MG PO TABS
1.0000 | ORAL_TABLET | Freq: Four times a day (QID) | ORAL | Status: DC | PRN
  Administered 2021-02-26 – 2021-03-06 (×10): 1 via ORAL
  Filled 2021-02-26 (×11): qty 1

## 2021-02-26 MED ORDER — LACTATED RINGERS IV SOLN: INTRAVENOUS | Status: DC

## 2021-02-26 MED ORDER — LACTATED RINGERS IV BOLUS (SEPSIS)
1000.0000 mL | Freq: Once | INTRAVENOUS | Status: AC
Start: 2021-02-26 — End: 2021-02-26
  Administered 2021-02-26: 1000 mL via INTRAVENOUS

## 2021-02-26 MED ORDER — LACTATED RINGERS IV BOLUS (SEPSIS)
1000.0000 mL | Freq: Once | INTRAVENOUS | Status: AC
  Administered 2021-02-26: 1000 mL via INTRAVENOUS

## 2021-02-26 MED ORDER — TRAZODONE HCL 100 MG PO TABS
100.0000 mg | ORAL_TABLET | Freq: Every day | ORAL | Status: DC
  Administered 2021-02-26 – 2021-03-06 (×9): 100 mg via ORAL
  Filled 2021-02-26 (×9): qty 1

## 2021-02-26 MED ORDER — LACTATED RINGERS IV BOLUS (SEPSIS)
500.0000 mL | Freq: Once | INTRAVENOUS | Status: AC
  Administered 2021-02-26: 500 mL via INTRAVENOUS

## 2021-02-26 MED ORDER — ACETAMINOPHEN 650 MG RE SUPP: 650.0000 mg | Freq: Four times a day (QID) | RECTAL | Status: DC | PRN

## 2021-02-26 MED ORDER — ACETAMINOPHEN 325 MG PO TABS
650.0000 mg | ORAL_TABLET | Freq: Four times a day (QID) | ORAL | Status: DC | PRN
  Administered 2021-03-04 – 2021-03-05 (×4): 650 mg via ORAL
  Filled 2021-02-26 (×4): qty 2

## 2021-02-26 MED ORDER — AMIODARONE HCL 200 MG PO TABS
200.0000 mg | ORAL_TABLET | Freq: Every day | ORAL | Status: DC
  Administered 2021-02-26 – 2021-03-07 (×10): 200 mg via ORAL
  Filled 2021-02-26 (×10): qty 1

## 2021-02-26 MED ORDER — SODIUM CHLORIDE 0.9 % IV SOLN
INTRAVENOUS | Status: DC | PRN
  Administered 2021-02-26 – 2021-02-27 (×2): 250 mL via INTRAVENOUS

## 2021-02-26 MED ORDER — LORATADINE 10 MG PO TABS
10.0000 mg | ORAL_TABLET | Freq: Every day | ORAL | Status: DC
  Administered 2021-02-26 – 2021-03-07 (×10): 10 mg via ORAL
  Filled 2021-02-26 (×10): qty 1

## 2021-02-26 MED ORDER — SODIUM CHLORIDE 0.9 % IV SOLN
2.0000 g | Freq: Three times a day (TID) | INTRAVENOUS | Status: DC
  Administered 2021-02-26 – 2021-03-07 (×27): 2 g via INTRAVENOUS
  Filled 2021-02-26 (×7): qty 2
  Filled 2021-02-26 (×2): qty 2000
  Filled 2021-02-26: qty 2
  Filled 2021-02-26: qty 2000
  Filled 2021-02-26: qty 2
  Filled 2021-02-26: qty 2000
  Filled 2021-02-26 (×2): qty 2
  Filled 2021-02-26 (×2): qty 2000
  Filled 2021-02-26 (×2): qty 2
  Filled 2021-02-26: qty 2000
  Filled 2021-02-26 (×2): qty 2
  Filled 2021-02-26 (×2): qty 2000
  Filled 2021-02-26: qty 2
  Filled 2021-02-26: qty 2000
  Filled 2021-02-26 (×2): qty 2
  Filled 2021-02-26: qty 2000

## 2021-02-26 MED ORDER — SENNOSIDES-DOCUSATE SODIUM 8.6-50 MG PO TABS: 1.0000 | ORAL_TABLET | Freq: Every evening | ORAL | Status: DC | PRN

## 2021-02-26 MED ORDER — APIXABAN 5 MG PO TABS
5.0000 mg | ORAL_TABLET | Freq: Two times a day (BID) | ORAL | Status: DC
  Administered 2021-02-26 – 2021-03-07 (×18): 5 mg via ORAL
  Filled 2021-02-26 (×12): qty 1
  Filled 2021-02-26: qty 2
  Filled 2021-02-26 (×3): qty 1
  Filled 2021-02-26: qty 2
  Filled 2021-02-26 (×3): qty 1

## 2021-02-26 MED ORDER — INSULIN ASPART 100 UNIT/ML IJ SOLN
0.0000 [IU] | Freq: Every day | INTRAMUSCULAR | Status: DC
  Administered 2021-03-03 – 2021-03-06 (×3): 2 [IU] via SUBCUTANEOUS
  Filled 2021-02-26: qty 0.05

## 2021-02-26 MED ORDER — INSULIN ASPART 100 UNIT/ML IJ SOLN
0.0000 [IU] | Freq: Three times a day (TID) | INTRAMUSCULAR | Status: DC
  Administered 2021-02-26 – 2021-02-27 (×3): 1 [IU] via SUBCUTANEOUS
  Administered 2021-02-27: 2 [IU] via SUBCUTANEOUS
  Administered 2021-02-28: 1 [IU] via SUBCUTANEOUS
  Administered 2021-02-28: 2 [IU] via SUBCUTANEOUS
  Administered 2021-03-01: 1 [IU] via SUBCUTANEOUS
  Administered 2021-03-01 – 2021-03-03 (×5): 2 [IU] via SUBCUTANEOUS
  Administered 2021-03-04 – 2021-03-05 (×4): 1 [IU] via SUBCUTANEOUS
  Administered 2021-03-05: 2 [IU] via SUBCUTANEOUS
  Administered 2021-03-06 – 2021-03-07 (×4): 1 [IU] via SUBCUTANEOUS
  Filled 2021-02-26: qty 0.09

## 2021-02-26 NOTE — Progress Notes (Signed)
Patient admitted from ED to room 1428.  CPAP at bedside.  Patient alert and oriented x 4.  Skin assessment completed with 2nd RN verification Theora Gianotti RN.  Dentures in place.  Hearing aids not present- wife has them at home.  Shirt, pants and underwear at bedside.  Cell phone at bedside.

## 2021-02-26 NOTE — ED Provider Notes (Signed)
em  [facility]Y DEPT Provider Note: Georgena Spurling, MD, FACEP  CSN: [id number] MRN: [id number] ARRIVAL: 02/26/21 at [place]: WA03/[id number]   CHIEF COMPLAINT  Fever   HISTORY OF PRESENT ILLNESS  02/26/21 2:21 AM [name] is a 75 y.o. male who had a stroke last month and was admitted 02/01/2021.  Since that time he has had generalized weakness, frequent falls and difficulty with activities of daily living.  Symptoms are better in the morning but worsens throughout the day.  Primarily his weakness is localized to his right leg.  He is here this morning with reports of altered mental status, although he is oriented to me.  He has reportedly been having a fever for several days (per the patient) or a week (per EMS).  His wife, who is a retired Marine scientist, has been giving him acetaminophen and ibuprofen for the fever.  He denies respiratory symptoms such as cough or shortness of breath.  He denies dysuria.  He denies nausea, vomiting or diarrhea.  He denies abdominal pain.  He was seen in an urgent care yesterday and reportedly tested negative for COVID.  He has had various abrasions and contusions due to his falls, notably the right lower leg.  EMS gave the patient a gram of Tylenol prior to arrival.  It was not reported what his temperature was prior to arrival but is 99.4 here.  He has a dialysis fistula but is not yet receiving dialysis.   Past Medical History:  Diagnosis Date   Allergic rhinitis    Anxiety    Atrial fibrillation (HCC)    Cancer (HCC)    hx of skin cancers    Chronic kidney disease    Coronary artery disease    a. s/p MI & CABG; b. s/p PCI Diag;  c. Cath 12/2011: LAD diff dzs/small, Diag 75% isr, 90 dist to stent (small), LCX & RCA occluded, VG->OM 40, VG->PDA patent - med rx.   Depression    PTSD   Diabetes mellitus    not on medications   Diastolic CHF, chronic ([place])    a. EF 55-60% by echo 2007   GERD (gastroesophageal reflux disease)    "not anymore" " I had a  lap band"   History of diverticulitis of colon    5 YRS AGO   History of pneumonia    2017   History of transient ischemic attack (TIA)    CAROTID DOPPLERS NOV 2011  0-39& BIL. 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   Complex valvuloplasty including artificial Gore-tex neochord placement x4, plication of anterior commissure, and 26 mm Sorin Memo 3D ring annuloplasty via right mini thoracotomy approach   Shortness of breath dyspnea    with exertion   Skin cancer    Sleep apnea    cpap- see ov note in EPIC 05/12/11 for settings    Stroke [facility])    hx of   Stroke [facility])     Past Surgical History:  Procedure Laterality Date   AV FISTULA PLACEMENT Left 08/02/2018   Procedure: ARTERIOVENOUS (AV) FISTULA CREATION RADIOCEPHALIC;  Surgeon: Angelia Mould, MD;  Location: [facility] OR;  Service: Vascular;  Laterality: Left;   [place]  2001, 2004, 2009, 2011   CARDIAC CATHETERIZATION N/A 01/23/2016   Procedure: Right/Left Heart Cath and Coronary Angiography;  Surgeon: [name], MD;  Location: [place] CV LAB;  Service: Cardiovascular;  Laterality: N/A;   CARDIOVERSION N/A 09/07/2017   Procedure: CARDIOVERSION;  Surgeon: [name], MD;  Location: [facility] ENDOSCOPY;  Service: Cardiovascular;  Laterality: N/A;   CARDIOVERSION N/A 09/13/2019   Procedure: CARDIOVERSION;  Surgeon: [name], MD;  Location: [facility] ENDOSCOPY;  Service: Cardiovascular;  Laterality: N/A;   CARDIOVERSION N/A 10/19/2019   Procedure: CARDIOVERSION;  Surgeon: [name], MD;  Location: [facility] ENDOSCOPY;  Service:  Cardiovascular;  Laterality: N/A;   CATARACT EXTRACTION W/ INTRAOCULAR LENS IMPLANT Bilateral    CHONDROPLASTY  07/22/2011   Procedure: CHONDROPLASTY;  Surgeon: Dione Plover Aluisio;  Location: [place];  Service: Orthopedics;;   CIRCUMCISION  [place]-- POST CABG   W/ STENT, last cath 01/07/2012    CORONARY ARTERY BYPASS GRAFT  1995   X3 VESSEL   CORONARY ARTERY BYPASS GRAFT  1995   CORONARY STENT PLACEMENT     CYSTOSCOPY/URETEROSCOPY/HOLMIUM LASER/STENT PLACEMENT Right 01/08/2021   Procedure: CYSTOSCOPY/RETROGRADE/URETEROSCOPY/HOLMIUM LASER/STENT PLACEMENT;  Surgeon: Franchot Gallo, MD;  Location: [facility] ORS;  Service: Urology;  Laterality: Right;   KNEE ARTHROSCOPY  07/22/2011   Procedure: ARTHROSCOPY KNEE;  Surgeon: [name];  Location: [place];  Service: Orthopedics;  Laterality: Right;  WITH DEBRIDEMENT    KNEE ARTHROSCOPY W/ MENISCECTOMY  X2 IN 2002-- RIGHT KNEE   LAPAROSCOPIC GASTRIC BANDING  01-15-09   LEFT HEART CATH AND CORONARY ANGIOGRAPHY N/A 04/29/2017   Procedure: LEFT HEART CATH AND CORONARY ANGIOGRAPHY;  Surgeon: [name], MD;  Location: [place] CV LAB;  Service: Cardiovascular;  Laterality: N/A;   LEFT HEART CATHETERIZATION WITH CORONARY ANGIOGRAM N/A 09/11/2013   Procedure: LEFT HEART CATHETERIZATION WITH CORONARY ANGIOGRAM;  Surgeon: [name], MD;  Location: [facility] CATH LAB;  Service: Cardiovascular;  Laterality: N/A;   MITRAL VALVE REPAIR Right 03/25/2016   Procedure: MINIMALLY INVASIVE REOPERATION FOR MITRAL VALVE REPAIR  (MVR) with size 26 Sorin Memo 3D;  Surgeon: [name], MD;  Location: [place];  Service: Open Heart Surgery;  Laterality: Right;   MITRAL VALVE REPAIR  03/2016   RIGHT KNEE ED COMPARTMENT REPLACEMENT  2004   SYNOVECTOMY  07/22/2011   Procedure: SYNOVECTOMY;  Surgeon: Dione Plover Aluisio;  Location: [place];  Service: Orthopedics;;   TEE  WITHOUT CARDIOVERSION N/A 01/23/2016   Procedure: TRANSESOPHAGEAL ECHOCARDIOGRAM (TEE);  Surgeon: [name], MD;  Location: [place];  Service: Cardiovascular;  Laterality: N/A;   TEE WITHOUT CARDIOVERSION N/A 03/25/2016   Procedure: TRANSESOPHAGEAL ECHOCARDIOGRAM (TEE);  Surgeon: [name], MD;  Location: [place];  Service: Open Heart Surgery;  Laterality: N/A;   UMBILICAL HERNIA REPAIR  01-15-09   W/ GASTRIC BANDING PROCEDURE    Family History  Problem Relation Age of Onset   Other Mother        joint problems   Alzheimer's disease Mother    Hypertension Father    Heart disease Father    CVA Father        age 11   Depression Father    Heart  disease Sister        CABG   Stroke Sister    Heart failure Sister        congestive   Diabetes Sister    Heart disease Brother    Hypertension Brother    Congestive Heart Failure Brother     Social History   Tobacco Use   Smoking status: Never   Smokeless tobacco: Never  Vaping Use   Vaping Use: Never used  Substance Use Topics   Alcohol use: Not Currently    Alcohol/week: 2.0 standard drinks    Types: 1 Glasses of wine, 1 Shots of liquor per week    Comment: OCCASIONAL   Drug use: Never    Prior to Admission medications   Medication Sig Start Date End Date Taking? Authorizing Provider  Alirocumab (PRALUENT) 75 MG/ML SOAJ Inject 75 mg into the skin every 14 (fourteen) days.   Yes [provider]  allopurinol (ZYLOPRIM) 100 MG tablet Take 0.5 tablets (50 mg total) by mouth 2 (two) times a week. Please discuss dosing with your PCP based on your renal function. Patient taking differently: Take 50 mg by mouth 2 (two) times a week. Please discuss dosing with your PCP based on your renal function. Tues & Fridays 01/13/21 02/26/21 Yes [name], MD  amiodarone (PACERONE) 200 MG tablet Take 1 tablet (200 mg total) by mouth daily. 10/19/19  Yes [name], MD  apixaban (ELIQUIS) 5 MG TABS tablet Take 1  tablet (5 mg total) by mouth 2 (two) times daily. 09/09/17  Yes [name], MD  Ascorbic Acid (VITAMIN C WITH ROSE HIPS) 1000 MG tablet Take 1,000 mg by mouth daily.   Yes [provider]  aspirin EC 81 MG EC tablet Take 1 tablet (81 mg total) by mouth daily. Swallow whole. 02/04/21  Yes Charlynne Cousins, MD  calcitRIOL (ROCALTROL) 0.25 MCG capsule Take 0.25 mcg by mouth every other day.   Yes [provider]  cetirizine (ZYRTEC) 10 MG tablet Take 10 mg by mouth daily.   Yes [provider]  colchicine 0.6 MG tablet Take 0.6-1.2 mg by mouth 2 (two) times daily as needed (gout).  04/14/19  Yes [provider]  fluticasone (FLONASE) 50 MCG/ACT nasal spray Place 1 spray into both nostrils daily as needed for allergies or rhinitis.   Yes [provider]  HYDROcodone-acetaminophen (NORCO/VICODIN) 5-325 MG tablet Take 1 tablet by mouth daily as needed for severe pain.   Yes [provider]  nitroGLYCERIN (NITROSTAT) 0.4 MG SL tablet 1 TAB UNDER THE TONGUE EVERY 5 MINUTES AS NEEDED FOR CHEST PAIN UP TO 3 DOSES, IF PERSIST CALL 911 03/05/20  Yes [name], MD  pantoprazole (PROTONIX) 40 MG tablet Take 40 mg by mouth daily as needed (heartburn).   Yes [provider]  Semaglutide,0.25 or 0.5MG /DOS, (OZEMPIC, 0.25 OR 0.5 MG/DOSE,) 2 MG/1.5ML SOPN Inject 0.375 mLs (0.5 mg total) into the skin every Thursday. Patient taking differently: Inject 0.5 mg into the skin once a week. Tuesdays 02/01/20  Yes Philemon Kingdom, MD  tadalafil (CIALIS) 20 MG tablet TAKE 1 TABLET BY MOUTH ONCE DAILY AS NEEDED 01/08/20  Yes McKenzie, Candee Furbish, MD  traZODone (DESYREL) 100 MG tablet Take 100 mg by mouth at bedtime.   Yes [provider]    Allergies Crestor [rosuvastatin], Lipitor [atorvastatin], Pravastatin, Carvedilol, Metformin, Serotonin reuptake inhibitors (ssris), Zocor [simvastatin], and Codeine   REVIEW OF SYSTEMS  Negative except  as noted here or in the History of Present Illness.   PHYSICAL EXAMINATION  Initial Vital Signs Blood pressure 96/61, pulse 90, temperature 99.4 F (37.4 C), temperature source Oral, resp. rate 18, height 5\' 10"  (1.778 m), weight 80.7 kg, SpO2 97 %.  Examination General: Well-developed, well-nourished male in no acute distress; appearance consistent with age of record HENT: normocephalic; atraumatic Eyes: Pupils pinpoint; extraocular muscles intact Neck: supple Heart: regular rate and rhythm with frequent irregular beats Lungs: clear to auscultation bilaterally Abdomen: soft; nondistended; nontender; gastric sleeve port right of navel; bowel sounds present Extremities: No deformity; full range of motion; dialysis fistula left forearm with pulse and thrill; abrasion right knee with ecchymoses of the right lower leg:    Neurologic: Awake, alert and oriented x 3; right hemiparesis, primarily in the right lower extremity; no facial droop Skin: Warm and dry Psychiatric: Normal mood and affect   RESULTS  Summary of this visit's results, reviewed and interpreted by myself:   EKG Interpretation  Date/Time:  Wednesday February 26 2021 02:56:13 EDT Ventricular Rate:  89 PR Interval:    QRS Duration: 178 QT Interval:  467 QTC Calculation: 569 R Axis:   -53 Text Interpretation: Indeterminate Rhythm RBBB and LAFB Rate is faster Confirmed by Darthy Manganelli, Jenny Reichmann [phone]) on 02/26/2021 3:06:38 AM        Laboratory Studies: Results for orders placed or performed during the hospital encounter of 02/26/21 (from the past 24 hour(s))  Resp Panel by RT-PCR (Flu A&B, Covid) Nasopharyngeal Swab     Status: None   Collection Time: 02/26/21  2:36 AM   Specimen: Nasopharyngeal Swab; Nasopharyngeal(NP) swabs in vial transport medium  Result Value Ref Range   SARS Coronavirus 2 by RT PCR NEGATIVE NEGATIVE   Influenza A by PCR NEGATIVE NEGATIVE   Influenza B by PCR NEGATIVE NEGATIVE  Lactic acid, plasma      Status: None   Collection Time: 02/26/21  2:36 AM  Result Value Ref Range   Lactic Acid, Venous 0.8 0.5 - 1.9 mmol/L  Comprehensive metabolic panel     Status: Abnormal   Collection Time: 02/26/21  2:36 AM  Result Value Ref Range   Sodium 127 (L) 135 - 145 mmol/L   Potassium 3.8 3.5 - 5.1 mmol/L   Chloride 96 (L) 98 - 111 mmol/L   CO2 23 22 - 32 mmol/L   Glucose, Bld 178 (H) 70 - 99 mg/dL   BUN 52 (H) 8 - 23 mg/dL   Creatinine, Ser 3.17 (H) 0.61 - 1.24 mg/dL   Calcium 8.8 (L) 8.9 - 10.3 mg/dL   Total Protein 5.2 (L) 6.5 - 8.1 g/dL   Albumin 2.8 (L) 3.5 - 5.0 g/dL   AST 36 15 - 41 U/L   ALT 36 0 - 44 U/L   Alkaline Phosphatase 94 38 - 126 U/L   Total Bilirubin 1.1 0.3 - 1.2 mg/dL   GFR, Estimated 20 (L) >60 mL/min   Anion gap 8 5 - 15  CBC WITH DIFFERENTIAL     Status: Abnormal   Collection Time: 02/26/21  2:36 AM  Result Value Ref Range   WBC 9.2 4.0 - 10.5 K/uL   RBC 2.62 (L) 4.22 - 5.81 MIL/uL   Hemoglobin 8.9 (L) 13.0 - 17.0 g/dL   HCT 25.8 (L) 39.0 - 52.0 %   MCV 98.5 80.0 - 100.0 fL   MCH 34.0 26.0 - 34.0 pg   MCHC 34.5 30.0 - 36.0 g/dL   RDW 13.3  11.5 - 15.5 %   Platelets 164 150 - 400 K/uL   nRBC 0.0 0.0 - 0.2 %   Neutrophils Relative % 84 %   Neutro Abs 7.7 1.7 - 7.7 K/uL   Lymphocytes Relative 6 %   Lymphs Abs 0.5 (L) 0.7 - 4.0 K/uL   Monocytes Relative 10 %   Monocytes Absolute 1.0 0.1 - 1.0 K/uL   Eosinophils Relative 0 %   Eosinophils Absolute 0.0 0.0 - 0.5 K/uL   Basophils Relative 0 %   Basophils Absolute 0.0 0.0 - 0.1 K/uL   Immature Granulocytes 0 %   Abs Immature Granulocytes 0.04 0.00 - 0.07 K/uL  Urinalysis, Routine w reflex microscopic     Status: Abnormal   Collection Time: 02/26/21  2:36 AM  Result Value Ref Range   Color, Urine YELLOW YELLOW   APPearance CLEAR CLEAR   Specific Gravity, Urine 1.010 1.005 - 1.030   pH 5.0 5.0 - 8.0   Glucose, UA NEGATIVE NEGATIVE mg/dL   Hgb urine dipstick MODERATE (A) NEGATIVE   Bilirubin Urine NEGATIVE  NEGATIVE   Ketones, ur NEGATIVE NEGATIVE mg/dL   Protein, ur NEGATIVE NEGATIVE mg/dL   Nitrite NEGATIVE NEGATIVE   Leukocytes,Ua NEGATIVE NEGATIVE   RBC / HPF 21-50 0 - 5 RBC/hpf   WBC, UA 0-5 0 - 5 WBC/hpf   Bacteria, UA RARE (A) NONE SEEN   Squamous Epithelial / LPF 0-5 0 - 5   Mucus PRESENT   POC occult blood, ED     Status: None   Collection Time: 02/26/21  5:03 AM  Result Value Ref Range   Fecal Occult Bld NEGATIVE NEGATIVE   Imaging Studies: DG Chest Port 1 View  Result Date: 02/26/2021 CLINICAL DATA:  Fever and body aches for 1 week. EXAM: PORTABLE CHEST 1 VIEW COMPARISON:  02/01/2021 FINDINGS: Postoperative changes in the mediastinum. Shallow inspiration. Heart size and pulmonary vascularity are normal for technique. No airspace disease or consolidation. No pleural effusions. No pneumothorax. Calcification of the aorta. IMPRESSION: Shallow inspiration.  No evidence of active pulmonary disease. Electronically Signed   By: Lucienne Capers M.D.   On: 02/26/2021 02:59    ED COURSE and MDM  Nursing notes, initial and subsequent vitals signs, including pulse oximetry, reviewed and interpreted by myself.  Vitals:   02/26/21 0300 02/26/21 0400 02/26/21 0445 02/26/21 0447  BP: (!) 101/57 (!) 96/48 (!) 103/55   Pulse: 78 68 66   Resp: (!) 25 20 17    Temp:    98 F (36.7 C)  TempSrc:    Oral  SpO2: 97% 98% 96%   Weight:      Height:       Medications  lactated ringers infusion (has no administration in time range)  lactated ringers bolus 1,000 mL (0 mLs Intravenous Stopped 02/26/21 0345)    And  lactated ringers bolus 1,000 mL (0 mLs Intravenous Stopped 02/26/21 0505)    And  lactated ringers bolus 500 mL (has no administration in time range)    2:38 AM Patient does not meet sepsis criteria at this time but sepsis protocol initiated.  5:05 AM No obvious source of the patient's fever was found and his lactate is normal.  He did receive an LR bolus in accordance with the sepsis  protocol.  His hemoglobin has dropped 3 points in the last 3 weeks.  On rectal exam his stool is brown and heme-negative.  The acute blood loss may be due to multiple ecchymoses due  to falls (a large hematoma was seen on the right buttock when performing the rectal exam).  This is likely contributing to his weakness and falling.  We will have him admitted for likely transfusion.  His corrected sodium is 128 which is down from 3 weeks ago when it was 138.  5:28 AM Dr. Tonie Griffith to admit to hospitalist service.  PROCEDURES  Procedures   ED DIAGNOSES     ICD-10-CM   1. Generalized weakness  R53.1     2. Febrile illness  R50.9     3. Symptomatic anemia  D64.9     4. Hyponatremia  E87.1     5. Frequent falls  R29.6     6. Contusion, multiple sites  T07.Encarnacion Chu, MD 02/26/21 [phone]

## 2021-02-26 NOTE — Progress Notes (Signed)
PHARMACY - PHYSICIAN COMMUNICATION CRITICAL VALUE ALERT - BLOOD CULTURE IDENTIFICATION (BCID)  [name] is an 75 y.o. male who presented to [facility] on 02/26/2021 with a chief complaint of multiple falls, weakness.  Assessment:  1 set, both bottles growing enterococcus faecalis (no resistance)  Name of physician (or Provider) Contacted: Gershon Cull, NP  Current antibiotics: none  Changes to prescribed antibiotics recommended:  Recommendations accepted by provider - begin ampicillin, ID will be consulted  Results for orders placed or performed during the hospital encounter of 02/26/21  Blood Culture ID Panel (Reflexed) (Collected: 02/26/2021  2:36 AM)  Result Value Ref Range   Enterococcus faecalis DETECTED (A) NOT DETECTED   Enterococcus Faecium NOT DETECTED NOT DETECTED   Listeria monocytogenes NOT DETECTED NOT DETECTED   Staphylococcus species NOT DETECTED NOT DETECTED   Staphylococcus aureus (BCID) NOT DETECTED NOT DETECTED   Staphylococcus epidermidis NOT DETECTED NOT DETECTED   Staphylococcus lugdunensis NOT DETECTED NOT DETECTED   Streptococcus species NOT DETECTED NOT DETECTED   Streptococcus agalactiae NOT DETECTED NOT DETECTED   Streptococcus pneumoniae NOT DETECTED NOT DETECTED   Streptococcus pyogenes NOT DETECTED NOT DETECTED   A.calcoaceticus-baumannii NOT DETECTED NOT DETECTED   Bacteroides fragilis NOT DETECTED NOT DETECTED   Enterobacterales NOT DETECTED NOT DETECTED   Enterobacter cloacae complex NOT DETECTED NOT DETECTED   Escherichia coli NOT DETECTED NOT DETECTED   Klebsiella aerogenes NOT DETECTED NOT DETECTED   Klebsiella oxytoca NOT DETECTED NOT DETECTED   Klebsiella pneumoniae NOT DETECTED NOT DETECTED   Proteus species NOT DETECTED NOT DETECTED   Salmonella species NOT DETECTED NOT DETECTED   Serratia marcescens NOT DETECTED NOT DETECTED   Haemophilus influenzae NOT DETECTED NOT DETECTED   Neisseria meningitidis NOT DETECTED NOT DETECTED    Pseudomonas aeruginosa NOT DETECTED NOT DETECTED   Stenotrophomonas maltophilia NOT DETECTED NOT DETECTED   Candida albicans NOT DETECTED NOT DETECTED   Candida auris NOT DETECTED NOT DETECTED   Candida glabrata NOT DETECTED NOT DETECTED   Candida krusei NOT DETECTED NOT DETECTED   Candida parapsilosis NOT DETECTED NOT DETECTED   Candida tropicalis NOT DETECTED NOT DETECTED   Cryptococcus neoformans/gattii NOT DETECTED NOT DETECTED   Vancomycin resistance NOT DETECTED NOT DETECTED    Peggyann Juba, PharmD, BCPS Pharmacy: [phone] 02/26/2021  7:19 PM

## 2021-02-26 NOTE — H&P (Signed)
History and Physical    [name] WCB:[id number] DOB: 1946-01-25 DOA: 02/26/2021  PCP: [name], MD   Patient coming from: Home  Chief Complaint: Multiple falls, weakness  HPI: [name] is a 75 y.o. male with medical history significant for atrial fibrillation, CAD, chronic kidney disease stage IV, history of stroke last month, diabetes mellitus type 2, HFpEF, mitral valve repair 5 years ago, gout, OA who presents for evaluation of generalized weakness with frequent falls.  Reports that when he left the hospital a few weeks ago after his stroke he was doing well and able to ambulate and do activities around the house without assistance.  For the last week he has been progressively weaker and having difficulty doing any type of activity.  He has had multiple falls at home over the last week and has multiple bruises on his back, hip, extremities.  He has a abrasion on his right knee.  He states that he just feels weak when he gets up to walk and needs assistance which is new for him.  He reports he has not had any change in vision and he has not had slurred speech or drooping face.  He has been seen in an urgent care yesterday and had a negative COVID test at that time.  He reports he has had COVID 3 times in the last 2 years.  He states he has not had any shortness of breath or cough.  He denies any abdominal pain, nausea vomiting or diarrhea.  He does have a dialysis fistula in his left forearm but he is not on dialysis yet.  ED Course: Patient has had a soft blood pressure in the emergency room but is otherwise hemodynamically stable.  He was given a gram of Tylenol by EMS in route to the hospital.  When he arrived at the emergency room his temperature was 99.4 degrees.  His wife states he has been running temperatures every night for the last 5 nights but during the day his temperature goes away.  He is found to have an anemia with a 3 g drop in his hemoglobin from June 12 of this  year.  On June 12 his hemoglobin was 11.9 and now it is 8.9.  Today hematocrit is 25.8.  Platelets have been stable in the 164,000-167,000 range.  WBCs are 9200.  Sodium is 127, potassium 3.8, chloride 96, bicarb 23, glucose 178, creatinine 3.17.  Creatinine was 2.88 on June 12.  BUN is increased to 52 from 31.  Albumin is 2.8.  Bilirubin is 1.1.  Calcium is 8.8.  Lactic acid is 0.8.  Last month patient had a hemoglobin A1c of 6.2.  Urinalysis is negative.  Urine and blood cultures have been obtained in the emergency room and will be monitored.  Hospital service been asked to admit for further management  Review of Systems:  General: Reports nighttime fever, chills. Reports generalized weakness. Denies weight loss, night sweats. Denies dizziness.  Denies change in appetite HENT: Denies head trauma, headache, denies change in hearing, tinnitus. Denies nasal congestion or bleeding. Denies sore throat.  Denies difficulty swallowing Eyes: Denies blurry vision, pain in eye, drainage.  Denies discoloration of eyes. Neck: Denies pain.  Denies swelling.  Denies pain with movement. Cardiovascular: Denies chest pain, palpitations.  Denies edema.  Denies orthopnea Respiratory: Denies shortness of breath, cough.  Denies wheezing.  Denies sputum production Gastrointestinal: Denies abdominal pain, swelling.  Denies nausea, vomiting, diarrhea.  Denies melena.  Denies  hematemesis. Musculoskeletal: Reports weakness and falls past week. Denies limitation of movement.  Denies deformity of joints. Denies arthralgias or myalgias. Genitourinary: Denies pelvic pain.  Denies urinary frequency or hesitancy.  Denies dysuria.  Skin: Denies rash.  Denies petechiae, purpura. Has multiple ecchymosis. Neurological: Denies syncope. Denies seizure activity. Denies paresthesia. Denies slurred speech, drooping face. Denies visual change. Psychiatric: Denies depression, anxiety. Denies hallucinations.  Past Medical History:  Diagnosis  Date   Allergic rhinitis    Anxiety    Atrial fibrillation (HCC)    Cancer (HCC)    hx of skin cancers    Chronic kidney disease    Coronary artery disease    a. s/p MI & CABG; b. s/p PCI Diag;  c. Cath 12/2011: LAD diff dzs/small, Diag 75% isr, 90 dist to stent (small), LCX & RCA occluded, VG->OM 40, VG->PDA patent - med rx.   Depression    PTSD   Diabetes mellitus    not on medications   Diastolic CHF, chronic ([place])    a. EF 55-60% by echo 2007   GERD (gastroesophageal reflux disease)    "not anymore" " I had a lap band"   History of diverticulitis of colon    5 YRS AGO   History of pneumonia    2017   History of transient ischemic attack (TIA)    CAROTID DOPPLERS NOV 2011  0-39& BIL. STENOSIS   Hyperlipidemia    Hypertension    Kidney failure    Mitral regurgitation    Myocardial infarction [facility])    Neuromuscular disorder (HCC)    left arm numbness    OA (osteoarthritis)    RIGHT KNEE ARTHOFIBROSIS W/ PAIN  (S/P  REPLACEMENT 2004)   PTSD (post-traumatic stress disorder)    from [place] since 1973   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/25/2016   Complex valvuloplasty including artificial Gore-tex neochord placement x4, plication of anterior commissure, and 26 mm Sorin Memo 3D ring annuloplasty via right mini thoracotomy approach   Shortness of breath dyspnea    with exertion   Skin cancer    Sleep apnea    cpap- see ov note in EPIC 05/12/11 for settings    Stroke [facility])    hx of   Stroke [facility])     Past Surgical History:  Procedure Laterality Date   AV FISTULA PLACEMENT Left 08/02/2018   Procedure: ARTERIOVENOUS (AV) FISTULA CREATION RADIOCEPHALIC;  Surgeon: Angelia Mould, MD;  Location: [place];  Service: Vascular;  Laterality: Left;   BLEPHAROPLASTY  [place]  2001, 2004, 2009, 2011   CARDIAC CATHETERIZATION N/A 01/23/2016   Procedure: Right/Left Heart Cath and Coronary Angiography;  Surgeon: [name], MD;   Location: [place] CV LAB;  Service: Cardiovascular;  Laterality: N/A;   CARDIOVERSION N/A 09/07/2017   Procedure: CARDIOVERSION;  Surgeon: [name], MD;  Location: [facility] ENDOSCOPY;  Service: Cardiovascular;  Laterality: N/A;   CARDIOVERSION N/A 09/13/2019   Procedure: CARDIOVERSION;  Surgeon: [name], MD;  Location: [facility] ENDOSCOPY;  Service: Cardiovascular;  Laterality: N/A;   CARDIOVERSION N/A 10/19/2019   Procedure: CARDIOVERSION;  Surgeon: [name], MD;  Location: [facility] ENDOSCOPY;  Service: Cardiovascular;  Laterality: N/A;   CATARACT EXTRACTION W/ INTRAOCULAR LENS IMPLANT Bilateral    CHONDROPLASTY  07/22/2011   Procedure: CHONDROPLASTY;  Surgeon: Dione Plover Aluisio;  Location: [place];  Service: Orthopedics;;   CIRCUMCISION  35 YRS  AGO  COLONOSCOPY     CORONARY ANGIOPLASTY  1996-- POST CABG   W/ STENT, last cath 01/07/2012    CORONARY ARTERY BYPASS GRAFT  1995   X3 VESSEL   CORONARY ARTERY BYPASS GRAFT  1995   CORONARY STENT PLACEMENT     CYSTOSCOPY/URETEROSCOPY/HOLMIUM LASER/STENT PLACEMENT Right 01/08/2021   Procedure: CYSTOSCOPY/RETROGRADE/URETEROSCOPY/HOLMIUM LASER/STENT PLACEMENT;  Surgeon: Franchot Gallo, MD;  Location: [facility];  Service: Urology;  Laterality: Right;   KNEE ARTHROSCOPY  07/22/2011   Procedure: ARTHROSCOPY KNEE;  Surgeon: [name];  Location: [place];  Service: Orthopedics;  Laterality: Right;  WITH DEBRIDEMENT    KNEE ARTHROSCOPY W/ MENISCECTOMY  X2 IN 2002-- RIGHT KNEE   LAPAROSCOPIC GASTRIC BANDING  01-15-09   LEFT HEART CATH AND CORONARY ANGIOGRAPHY N/A 04/29/2017   Procedure: LEFT HEART CATH AND CORONARY ANGIOGRAPHY;  Surgeon: [name], MD;  Location: [place] CV LAB;  Service: Cardiovascular;  Laterality: N/A;   LEFT HEART CATHETERIZATION WITH CORONARY ANGIOGRAM N/A 09/11/2013   Procedure: LEFT HEART CATHETERIZATION WITH CORONARY ANGIOGRAM;  Surgeon: [name], MD;  Location: [facility]  CATH LAB;  Service: Cardiovascular;  Laterality: N/A;   MITRAL VALVE REPAIR Right 03/25/2016   Procedure: MINIMALLY INVASIVE REOPERATION FOR MITRAL VALVE REPAIR  (MVR) with size 26 Sorin Memo 3D;  Surgeon: [name], MD;  Location: [place];  Service: Open Heart Surgery;  Laterality: Right;   MITRAL VALVE REPAIR  03/2016   RIGHT KNEE ED COMPARTMENT REPLACEMENT  2004   SYNOVECTOMY  07/22/2011   Procedure: SYNOVECTOMY;  Surgeon: Dione Plover Aluisio;  Location: [place];  Service: Orthopedics;;   TEE WITHOUT CARDIOVERSION N/A 01/23/2016   Procedure: TRANSESOPHAGEAL ECHOCARDIOGRAM (TEE);  Surgeon: [name], MD;  Location: [place];  Service: Cardiovascular;  Laterality: N/A;   TEE WITHOUT CARDIOVERSION N/A 03/25/2016   Procedure: TRANSESOPHAGEAL ECHOCARDIOGRAM (TEE);  Surgeon: [name], MD;  Location: [place];  Service: Open Heart Surgery;  Laterality: N/A;   UMBILICAL HERNIA REPAIR  01-15-09   W/ GASTRIC BANDING PROCEDURE    Social History  reports that he has never smoked. He has never used smokeless tobacco. He reports previous alcohol use of about 2.0 standard drinks of alcohol per week. He reports that he does not use drugs.  Allergies  Allergen Reactions   Crestor [Rosuvastatin] Other (See Comments)    Muscle aches    Lipitor [Atorvastatin] Other (See Comments)    Muscle aches   Pravastatin Other (See Comments)    Muscle aches   Carvedilol     Other reaction(s): loss of appetite   Metformin Diarrhea   Serotonin Reuptake Inhibitors (Ssris)     Other reaction(s): Muscle aches   Zocor [Simvastatin]     Other reaction(s): Muscle pain   Codeine Itching and Other (See Comments)    Extremity tingling-- can take synthetic    Family History  Problem Relation Age of Onset   Other Mother        joint problems   Alzheimer's disease Mother    Hypertension Father    Heart disease Father    CVA Father        age 46   Depression Father    Heart disease  Sister        CABG   Stroke Sister    Heart failure Sister        congestive   Diabetes Sister    Heart disease Brother    Hypertension Brother  Congestive Heart Failure Brother      Prior to Admission medications   Medication Sig Start Date End Date Taking? Authorizing Provider  Alirocumab (PRALUENT) 75 MG/ML SOAJ Inject 75 mg into the skin every 14 (fourteen) days.   Yes [provider]  allopurinol (ZYLOPRIM) 100 MG tablet Take 0.5 tablets (50 mg total) by mouth 2 (two) times a week. Please discuss dosing with your PCP based on your renal function. Patient taking differently: Take 50 mg by mouth 2 (two) times a week. Please discuss dosing with your PCP based on your renal function. Tues & Fridays 01/13/21 02/26/21 Yes [name], MD  amiodarone (PACERONE) 200 MG tablet Take 1 tablet (200 mg total) by mouth daily. 10/19/19  Yes [name], MD  apixaban (ELIQUIS) 5 MG TABS tablet Take 1 tablet (5 mg total) by mouth 2 (two) times daily. 09/09/17  Yes [name], MD  Ascorbic Acid (VITAMIN C WITH ROSE HIPS) 1000 MG tablet Take 1,000 mg by mouth daily.   Yes [provider]  aspirin EC 81 MG EC tablet Take 1 tablet (81 mg total) by mouth daily. Swallow whole. 02/04/21  Yes Charlynne Cousins, MD  calcitRIOL (ROCALTROL) 0.25 MCG capsule Take 0.25 mcg by mouth every other day.   Yes [provider]  cetirizine (ZYRTEC) 10 MG tablet Take 10 mg by mouth daily.   Yes [provider]  colchicine 0.6 MG tablet Take 0.6-1.2 mg by mouth 2 (two) times daily as needed (gout).  04/14/19  Yes [provider]  fluticasone (FLONASE) 50 MCG/ACT nasal spray Place 1 spray into both nostrils daily as needed for allergies or rhinitis.   Yes [provider]  HYDROcodone-acetaminophen (NORCO/VICODIN) 5-325 MG tablet Take 1 tablet by mouth daily as needed for severe pain.   Yes [provider]  nitroGLYCERIN (NITROSTAT) 0.4 MG SL  tablet 1 TAB UNDER THE TONGUE EVERY 5 MINUTES AS NEEDED FOR CHEST PAIN UP TO 3 DOSES, IF PERSIST CALL 911 03/05/20  Yes [name], MD  pantoprazole (PROTONIX) 40 MG tablet Take 40 mg by mouth daily as needed (heartburn).   Yes [provider]  Semaglutide,0.25 or 0.5MG /DOS, (OZEMPIC, 0.25 OR 0.5 MG/DOSE,) 2 MG/1.5ML SOPN Inject 0.375 mLs (0.5 mg total) into the skin every Thursday. Patient taking differently: Inject 0.5 mg into the skin once a week. Tuesdays 02/01/20  Yes Philemon Kingdom, MD  tadalafil (CIALIS) 20 MG tablet TAKE 1 TABLET BY MOUTH ONCE DAILY AS NEEDED 01/08/20  Yes McKenzie, Candee Furbish, MD  traZODone (DESYREL) 100 MG tablet Take 100 mg by mouth at bedtime.   Yes [provider]    Physical Exam: Vitals:   02/26/21 0300 02/26/21 0400 02/26/21 0445 02/26/21 0447  BP: (!) 101/57 (!) 96/48 (!) 103/55   Pulse: 78 68 66   Resp: (!) 25 20 17    Temp:    98 F (36.7 C)  TempSrc:    Oral  SpO2: 97% 98% 96%   Weight:      Height:        Constitutional: NAD, calm, comfortable Vitals:   02/26/21 0300 02/26/21 0400 02/26/21 0445 02/26/21 0447  BP: (!) 101/57 (!) 96/48 (!) 103/55   Pulse: 78 68 66   Resp: (!) 25 20 17    Temp:    98 F (36.7 C)  TempSrc:    Oral  SpO2: 97% 98% 96%   Weight:      Height:  General: WDWN, Alert and oriented x3.  Eyes: EOMI, PERRL, conjunctivae pale pink.  Sclera nonicteric HENT:  [place]/AT, external ears normal.  Nares patent without epistasis.  Mucous membranes are moist. Posterior pharynx clear of any exudate or lesions.  Neck: Soft, normal range of motion, supple, no masses,Trachea midline Respiratory: clear to auscultation bilaterally, no wheezing, no crackles. Normal respiratory effort. No accessory muscle use.  Cardiovascular:  Regular rhythm and rate, no murmurs / rubs / gallops. No extremity edema. 1+ pedal pulses.  Abdomen: Soft, no tenderness, nondistended, no rebound or guarding.  No masses palpated. Bowel  sounds normoactive. Has port for lap band in mid right abdomen.  Musculoskeletal: FROM. no cyanosis. No joint deformity upper and lower extremities. Normal muscle tone.  Skin: Warm, dry, intact no rashes, lesions, ulcers. No induration.  Has multiple ecchymosis on right hip, low back, lower extremities and upper extremities Neurologic: CN 2-12 grossly intact.  Normal speech.  Sensation intact to touch. Strength 4/5 in all extremities.   Psychiatric: Normal judgment and insight.  Normal mood.    Labs on Admission: I have personally reviewed following labs and imaging studies  CBC: Recent Labs  Lab 02/26/21 0236  WBC 9.2  NEUTROABS 7.7  HGB 8.9*  HCT 25.8*  MCV 98.5  PLT 202    Basic Metabolic Panel: Recent Labs  Lab 02/26/21 0236  NA 127*  K 3.8  CL 96*  CO2 23  GLUCOSE 178*  BUN 52*  CREATININE 3.17*  CALCIUM 8.8*    GFR: Estimated Creatinine Clearance: 20.8 mL/min (A) (by C-G formula based on SCr of 3.17 mg/dL (H)).  Liver Function Tests: Recent Labs  Lab 02/26/21 0236  AST 36  ALT 36  ALKPHOS 94  BILITOT 1.1  PROT 5.2*  ALBUMIN 2.8*    Urine analysis:    Component Value Date/Time   COLORURINE YELLOW 02/26/2021 0236   APPEARANCEUR CLEAR 02/26/2021 0236   LABSPEC 1.010 02/26/2021 0236   PHURINE 5.0 02/26/2021 0236   GLUCOSEU NEGATIVE 02/26/2021 0236   HGBUR MODERATE (A) 02/26/2021 0236   BILIRUBINUR NEGATIVE 02/26/2021 0236   KETONESUR NEGATIVE 02/26/2021 0236   PROTEINUR NEGATIVE 02/26/2021 0236   UROBILINOGEN 0.2 04/04/2015 1529   NITRITE NEGATIVE 02/26/2021 0236   LEUKOCYTESUR NEGATIVE 02/26/2021 0236    Radiological Exams on Admission: DG Chest Port 1 View  Result Date: 02/26/2021 CLINICAL DATA:  Fever and body aches for 1 week. EXAM: PORTABLE CHEST 1 VIEW COMPARISON:  02/01/2021 FINDINGS: Postoperative changes in the mediastinum. Shallow inspiration. Heart size and pulmonary vascularity are normal for technique. No airspace disease or  consolidation. No pleural effusions. No pneumothorax. Calcification of the aorta. IMPRESSION: Shallow inspiration.  No evidence of active pulmonary disease. Electronically Signed   By: Lucienne Capers M.D.   On: 02/26/2021 02:59    EKG: Independently reviewed.  EKG shows atrial fibrillation with rate control.  Right bundle branch block present.  Left and anterior fascicular block.  No acute ST elevation or depression.  QTc 569  Assessment/Plan Principal Problem:   Anemia Mr. Beougher is admitted to telemetry floor.  He has had a 3 gm drop in Hgb over past 3 weeks. Is combination of CKD stage IV complicated by multiple bruises over his body from recent falls.  Patient had brown stool on rectal exam in the emergency room that was Hemoccult negative.  He has no complaint of having melena or bright red blood from rectum.  He denies having hematuria. Patient is wife states that  he has bruising much more and bleeding more easily since having aspirin added to his Eliquis dose after his stroke.  They would like to stop the aspirin  Active Problems:   Type 2 diabetes mellitus without complications  Patient is on Ozempic weekly.  Monitor blood sugars with meals and at bedtime.  Patient placed on a heart low-carb diet. Corrective insulin is ordered for glycemic control    Diastolic CHF, chronic  Stable. Monitor I&Os and volume status. No signs of volume overload at this time.  Check BNP and troponin     Hyponatremia Mild hyponatremia with sodium of 127. Pt given IVF.  Monitor electrolytes and renal function.     Weakness generalized Pt is having frequent falls at home over the past week. After his discharge from hospital for stroke he was ambulating and doing well.  In the last week he has become weaker and off balance and having frequent falls.  He has multiple bruises on his back, hip, extremities from the falls    CKD (chronic kidney disease) stage 4, GFR 15-29 ml/min Stable    Fever, unknown  origin Reports having fevers at night for the last 5 days.  Temperatures have reached 102 degrees at home.  Wife has been giving Tylenol and last night gave Motrin for the fever which helped.  He has not been having fevers during the day according to him and his wife. Blood cultures were obtained in the emergency room and will be monitored.  If blood cultures are positive patient will need echocardiogram to look for endocarditis    Atrial fibrillation, chronic  Stable on amiodarone and Eliquis.    Chronic anticoagulation On Eliquis for atrial fibrillation.  Has been on aspirin for the last few weeks which is caused increase in bleeding and bleeding when he gets cut.  They would like to stop the aspirin at this time    Prolonged QT interval Avoid medications which could further prolong QT interval.  Monitor on telemetry    CAD, AUTOLOGOUS BYPASS GRAFT     DVT prophylaxis: Pt is anticoagulated chronically on Eliquis which is continued. Code Status:   Full code Family Communication:  Diagnosis and plan discussed with patient and his wife who is at bedside.  Patient verbalized understanding agrees with plan.  Further recommendations to follow as clinically indicated Disposition Plan:   Patient is from:  Home  Anticipated DC to:  Home  Anticipated DC date:  Anticipate 2 midnight or more stay in the hospital  Anticipated DC barriers: No barriers to discharge identified at this time  Admission status:  Inpatient   Yevonne Aline Manon Banbury MD [facility]  How to contact the [facility] Attending or Consulting provider [place] or covering provider during after hours [place], for this patient?   Check the care team in [facility] and look for a) attending/consulting [facility] provider listed and b) the [facility] team listed Log into [email or web address] and use [place]'s universal password to access. If you do not have the password, please contact the hospital operator. Locate the [facility] provider you are looking for under  [facility] and page to a number that you can be directly reached. If you still have difficulty reaching the provider, please page the [facility] (Director on Call) for the Hospitalists listed on amion for assistance.  02/26/2021, 5:53 AM

## 2021-02-26 NOTE — Progress Notes (Signed)
RT placed pt on CPAP. Per pt his settings is 6. Pt currently on CPAP 6 with no complications.

## 2021-02-26 NOTE — ED Triage Notes (Signed)
Pt to ED via EMS with c/o fevers x 1 week.  Pt has been alternating tylenol and motrin without resolve.  Pt was tested for COVID and Flu yesterday.  Tylenol 1000mg  given in route by EMS.

## 2021-02-26 NOTE — ED Notes (Signed)
Pt requesting CPAP to help sleep. Paged Mal Misty MD for order.

## 2021-02-26 NOTE — Progress Notes (Signed)
Patient seen and examined at the bedside.  Respiratory therapist was at the bedside to assist with CPAP for sleep.  He feels a little better.  Continue current management.

## 2021-02-27 DIAGNOSIS — R7881 Bacteremia: Secondary | ICD-10-CM | POA: Diagnosis not present

## 2021-02-27 DIAGNOSIS — N184 Chronic kidney disease, stage 4 (severe): Secondary | ICD-10-CM

## 2021-02-27 DIAGNOSIS — T07XXXA Unspecified multiple injuries, initial encounter: Secondary | ICD-10-CM | POA: Diagnosis not present

## 2021-02-27 DIAGNOSIS — B952 Enterococcus as the cause of diseases classified elsewhere: Secondary | ICD-10-CM

## 2021-02-27 DIAGNOSIS — R509 Fever, unspecified: Secondary | ICD-10-CM

## 2021-02-27 DIAGNOSIS — M25462 Effusion, left knee: Secondary | ICD-10-CM

## 2021-02-27 DIAGNOSIS — I5032 Chronic diastolic (congestive) heart failure: Secondary | ICD-10-CM

## 2021-02-27 DIAGNOSIS — I482 Chronic atrial fibrillation, unspecified: Secondary | ICD-10-CM

## 2021-02-27 LAB — BASIC METABOLIC PANEL
Anion gap: 8 (ref 5–15)
BUN: 45 mg/dL — ABNORMAL HIGH (ref 8–23)
CO2: 24 mmol/L (ref 22–32)
Calcium: 9 mg/dL (ref 8.9–10.3)
Chloride: 100 mmol/L (ref 98–111)
Creatinine, Ser: 2.52 mg/dL — ABNORMAL HIGH (ref 0.61–1.24)
GFR, Estimated: 26 mL/min — ABNORMAL LOW (ref >60)
Glucose, Bld: 153 mg/dL — ABNORMAL HIGH (ref 70–99)
Potassium: 4.2 mmol/L (ref 3.5–5.1)
Sodium: 132 mmol/L — ABNORMAL LOW (ref 135–145)

## 2021-02-27 LAB — URINE CULTURE: Culture: <10000 — AB

## 2021-02-27 LAB — CBC
HCT: 26.4 % — ABNORMAL LOW (ref 39.0–52.0)
Hemoglobin: 9 g/dL — ABNORMAL LOW (ref 13.0–17.0)
MCH: 33.6 pg (ref 26.0–34.0)
MCHC: 34.1 g/dL (ref 30.0–36.0)
MCV: 98.5 fL (ref 80.0–100.0)
Platelets: 164 10*3/uL (ref 150–400)
RBC: 2.68 MIL/uL — ABNORMAL LOW (ref 4.22–5.81)
RDW: 13.5 % (ref 11.5–15.5)
WBC: 12.8 10*3/uL — ABNORMAL HIGH (ref 4.0–10.5)
nRBC: 0 % (ref 0.0–0.2)

## 2021-02-27 LAB — GLUCOSE, CAPILLARY
Glucose-Capillary: 145 mg/dL — ABNORMAL HIGH (ref 70–99)
Glucose-Capillary: 117 mg/dL — ABNORMAL HIGH (ref 70–99)
Glucose-Capillary: 172 mg/dL — ABNORMAL HIGH (ref 70–99)
Glucose-Capillary: 141 mg/dL — ABNORMAL HIGH (ref 70–99)

## 2021-02-27 MED ORDER — COLCHICINE 0.6 MG PO TABS: 0.6000 mg | ORAL_TABLET | Freq: Two times a day (BID) | ORAL | Status: DC | PRN

## 2021-02-27 MED ORDER — CHLORHEXIDINE GLUCONATE 0.12 % MT SOLN
15.0000 mL | Freq: Two times a day (BID) | OROMUCOSAL | Status: DC
  Administered 2021-02-27 – 2021-03-07 (×17): 15 mL via OROMUCOSAL
  Filled 2021-02-27 (×17): qty 15

## 2021-02-27 MED ORDER — ORAL CARE MOUTH RINSE
15.0000 mL | Freq: Two times a day (BID) | OROMUCOSAL | Status: DC
  Administered 2021-02-27 – 2021-03-07 (×16): 15 mL via OROMUCOSAL

## 2021-02-27 MED ORDER — HYDROMORPHONE HCL 1 MG/ML IJ SOLN
0.5000 mg | INTRAMUSCULAR | Status: DC | PRN
  Administered 2021-02-27 – 2021-03-06 (×18): 0.5 mg via INTRAVENOUS
  Filled 2021-02-27 (×21): qty 0.5

## 2021-02-27 MED ORDER — PANTOPRAZOLE SODIUM 40 MG PO TBEC: 40.0000 mg | DELAYED_RELEASE_TABLET | Freq: Every day | ORAL | Status: DC | PRN

## 2021-02-27 MED ORDER — FLUTICASONE PROPIONATE 50 MCG/ACT NA SUSP
1.0000 | Freq: Every day | NASAL | Status: DC | PRN
  Filled 2021-02-27: qty 16

## 2021-02-27 MED ORDER — BISACODYL 5 MG PO TBEC
5.0000 mg | DELAYED_RELEASE_TABLET | Freq: Once | ORAL | Status: AC
  Administered 2021-02-27: 5 mg via ORAL
  Filled 2021-02-27: qty 1

## 2021-02-27 MED ORDER — SODIUM CHLORIDE 0.9 % IV SOLN
2.0000 g | Freq: Two times a day (BID) | INTRAVENOUS | Status: DC
  Administered 2021-02-27 – 2021-03-07 (×17): 2 g via INTRAVENOUS
  Filled 2021-02-27: qty 20
  Filled 2021-02-27 (×5): qty 2
  Filled 2021-02-27: qty 20
  Filled 2021-02-27: qty 2
  Filled 2021-02-27: qty 20
  Filled 2021-02-27 (×3): qty 2
  Filled 2021-02-27: qty 20
  Filled 2021-02-27 (×5): qty 2

## 2021-02-27 MED ORDER — CALCITRIOL 0.25 MCG PO CAPS
0.2500 ug | ORAL_CAPSULE | ORAL | Status: DC
  Administered 2021-02-27 – 2021-03-07 (×5): 0.25 ug via ORAL
  Filled 2021-02-27 (×5): qty 1

## 2021-02-27 MED ORDER — ASPIRIN EC 81 MG PO TBEC
81.0000 mg | DELAYED_RELEASE_TABLET | Freq: Every day | ORAL | Status: DC
  Administered 2021-02-27: 81 mg via ORAL
  Filled 2021-02-27: qty 1

## 2021-02-27 NOTE — Evaluation (Signed)
Occupational Therapy Evaluation Patient Details Name: [name] MRN: [id number] DOB: 1945/12/03 Today's Date: 02/27/2021    History of Present Illness Patient admited for anemia. [name] is a 75 y.o. male with medical history significant for atrial fibrillation, CAD, chronic kidney disease stage IV, history of stroke last month, diabetes mellitus type 2, HFpEF, mitral valve repair 5 years ago, gout, OA who presents for evaluation of generalized weakness with frequent falls.   Clinical Impression   [name] is a 75 year old man with a history of recent CVAs with right sided hemiparesis and foot drop who presents now with additional generalized weakness, generalized pain from frequent falls, left lower extremity pain and impaired balance. Patient min assist to transfer to side of bed and stand to transfer to recliner. Activity limited due to pain. Patient also requiring increased assistance for ADLs - especially lower body ADLs and predominantly limited to set up and seated positioning for UB ADLs. Patient will benefit from skilled OT services while in hospital to improve deficits and learn compensatory strategies as needed in order to return to PLOF.  Patient reports having a lot of family assistance at home. Patient receiving OP PT prior to admission. Hope to progress patient so he can discharge home and return to OP PT.     Follow Up Recommendations  No OT follow up    Equipment Recommendations  None recommended by OT    Recommendations for Other Services       Precautions / Restrictions Precautions Precautions: Fall Precaution Comments: Left knee area pain Restrictions Weight Bearing Restrictions: No      Mobility Bed Mobility Overal bed mobility: Needs Assistance Bed Mobility: Supine to Sit     Supine to sit: Min assist;HOB elevated     General bed mobility comments: Min assist to transfer to side of bed. Needed use of bed rail and increased time. Limited  by pain.    Transfers Overall transfer level: Needs assistance Equipment used: Rolling walker (2 wheeled) Transfers: Sit to/from [facility] Sit to Stand: Min assist Stand pivot transfers: Min assist            Balance Overall balance assessment: History of Falls;Needs assistance Sitting-balance support: No upper extremity supported;Feet supported Sitting balance-Leahy Scale: Good     Standing balance support: During functional activity Standing balance-Leahy Scale: Fair                             ADL either performed or assessed with clinical judgement   ADL Overall ADL's : Needs assistance/impaired Eating/Feeding: Independent   Grooming: Set up;Sitting   Upper Body Bathing: Set up;Sitting   Lower Body Bathing: Maximal assistance;Sit to/from stand   Upper Body Dressing : Moderate assistance;Sitting   Lower Body Dressing: Maximal assistance;Sit to/from stand   Toilet Transfer: Min guard;BSC;RW   Toileting- Water quality scientist and Hygiene: Minimal assistance;Sit to/from stand       Functional mobility during ADLs: Minimal assistance;Rolling walker       Vision Patient Visual Report: No change from baseline       Perception     Praxis      Pertinent Vitals/Pain Pain Assessment: Faces Faces Pain Scale: Hurts whole lot Pain Location: L knee Pain Descriptors / Indicators: Aching;Grimacing;Guarding;Sharp Pain Intervention(s): Limited activity within patient's tolerance;Monitored during session;RN gave pain meds during session     Hand Dominance Right   Extremity/Trunk Assessment Upper Extremity Assessment  Upper Extremity Assessment: RUE deficits/detail;LUE deficits/detail RUE Deficits / Details: WFL ROM, overall 4/5 strength RUE Sensation: decreased light touch (dull) RUE Coordination: decreased fine motor (WFL. Reports finger to thumb slower) LUE Deficits / Details: WFL ROM and overall 4/5 strength, pain in left  shoulder LUE Sensation: WNL LUE Coordination: WNL   Lower Extremity Assessment Lower Extremity Assessment: Defer to PT evaluation   Cervical / Trunk Assessment Cervical / Trunk Assessment: Normal   Communication Communication Communication: No difficulties   Cognition Arousal/Alertness: Awake/alert Behavior During Therapy: WFL for tasks assessed/performed Overall Cognitive Status: Within Functional Limits for tasks assessed                                     General Comments       Exercises     Shoulder Instructions      Home Living Family/patient expects to be discharged to:: Private residence Living Arrangements: Spouse/significant other Available Help at Discharge: Family;Available 24 hours/day Type of Home: House Home Access: Level entry     Home Layout: Multi-level;Able to live on main level with bedroom/bathroom     Bathroom Shower/Tub: Occupational psychologist: Handicapped height     Home Equipment: Clinical cytogeneticist - 2 wheels;Cane - single point;Bedside commode   Additional Comments: Reports using a cane since discharge. Hasn't had a chance to get foot brace yet.      Prior Functioning/Environment Level of Independence: Independent        Comments: From Prior Admission: likes to swing dance, wife was an Lag[employer] at [employer] for 40 years. Has been mostly sitting since most recent covid infection        OT Problem List: Decreased activity tolerance;Impaired balance (sitting and/or standing);Pain;Decreased knowledge of use of DME or AE      OT Treatment/Interventions: Self-care/ADL training;Therapeutic exercise;DME and/or AE instruction;Therapeutic activities;Patient/family education;Balance training;Neuromuscular education    OT Goals(Current goals can be found in the care plan section) Acute Rehab OT Goals Patient Stated Goal: walk without falling OT Goal Formulation: With patient Time For Goal Achievement:  03/13/21 Potential to Achieve Goals: Good  OT Frequency: Min 2X/week   Barriers to D/C:            Co-evaluation              AM-PAC OT "6 Clicks" Daily Activity     Outcome Measure Help from another person eating meals?: None Help from another person taking care of personal grooming?: A Little Help from another person toileting, which includes using toliet, bedpan, or urinal?: A Little Help from another person bathing (including washing, rinsing, drying)?: A Lot Help from another person to put on and taking off regular upper body clothing?: A Lot Help from another person to put on and taking off regular lower body clothing?: A Lot 6 Click Score: 16   End of Session Equipment Utilized During Treatment: Rolling walker Nurse Communication: Mobility status  Activity Tolerance: Patient tolerated treatment well Patient left: in chair;with call bell/phone within reach;with chair alarm set  OT Visit Diagnosis: Other abnormalities of gait and mobility (R26.89);Repeated falls (R29.6);Pain                Time: 6962-9528 OT Time Calculation (min): 30 min Charges:  OT General Charges $OT Visit: 1 Visit OT Evaluation $OT Eval Moderate Complexity: 1 Mod OT Treatments $Therapeutic Activity: 8-22 mins  Rucha Wissinger, OTR/L Acute Care  [place] [phone] Pager: [place] 02/27/2021, 11:19 AM

## 2021-02-27 NOTE — Progress Notes (Addendum)
Progress Note    [name]  NID:[id number] DOB: 1946/08/04  DOA: 02/26/2021 PCP: [name], MD      Brief Narrative:    Medical records reviewed and are as summarized below:  [name] is a 75 y.o. male  with medical history significant for atrial fibrillation, CAD, chronic kidney disease stage IV, history of stroke last month, diabetes mellitus type 2, HFpEF, mitral valve repair 5 years ago, gout, osteoarthritis, who presented to the hospital because of generalized weakness, frequent falls at home and fever.  He said symptoms have been going on for about a week now.  He had multiple falls at home that caused him to have a lot of bruises on his back, hip and lower extremities.  He also has generalized body pain from his fall.  He was found to have acute hyponatremia and he was given IV fluids for this.  Blood culture also revealed Enterococcus faecalis so he was started on empiric IV antibiotics.      Assessment/Plan:   Principal Problem:   Anemia Active Problems:   Type 2 diabetes mellitus without complications (HCC)   Essential hypertension   CAD, AUTOLOGOUS BYPASS GRAFT   Diastolic CHF, chronic (HCC)   Hyponatremia   Weakness generalized   CKD (chronic kidney disease) stage 4, GFR 15-29 ml/min (HCC)   Atrial fibrillation, chronic (HCC)   Chronic anticoagulation   Fever   Prolonged QT interval   Bacteremia due to Gram-positive bacteria    Body mass index is 27.11 kg/m.  Acute hyponatremia: Improved.  Discontinue IV fluids to avoid fluid overload.  Monitor BMP off of IV fluids.  Fever at home, Enterococcus faecalis bacteremia: Continue IV Unasyn.  Consulted ID to assist with management.  Cardiology has been consulted for TEE.  Follow-up blood culture sensitivity report.  Chronic diastolic CHF: Compensated.  CKD stage IV: Creatinine is stable  Chronic atrial fibrillation: Continue amiodarone and Eliquis.  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  Anemia of chronic disease: Hemoglobin is stable.  Stool for occult blood was negative.  Mildly elevated but flat troponins: This is likely from demand ischemia.  Type II DM: NovoLog as needed for hyperglycemia.  CAD and history of stroke: Patient said he has experienced easy bruising since addition of aspirin to Eliquis, and he prefers to discontinue aspirin.  Generalized weakness, multiple falls at home: Outpatient PT was recommended.    Diet Order             Diet heart healthy/carb modified Room service appropriate? Yes; Fluid consistency: Thin  Diet effective now                      Consultants: Infectious disease Cardiologist for TEE  Procedures: None    Medications:    amiodarone  200 mg Oral Daily   apixaban  5 mg Oral BID   aspirin EC  81 mg Oral Daily   calcitRIOL  0.25 mcg Oral QODAY   chlorhexidine  15 mL Mouth Rinse BID   insulin aspart  0-5 Units Subcutaneous QHS   insulin aspart  0-9 Units Subcutaneous TID WC   loratadine  10 mg Oral Daily   mouth rinse  15 mL Mouth Rinse q12n4p   traZODone  100 mg Oral QHS   Continuous Infusions:  sodium chloride 10 mL/hr at 02/27/21 0421   ampicillin (OMNIPEN) IV 2 g (02/27/21 1202)  Anti-infectives (From admission, onward)    Start     Dose/Rate Route Frequency Ordered Stop   02/26/21 2000  ampicillin (OMNIPEN) 2 g in sodium chloride 0.9 % 100 mL IVPB        2 g 300 mL/hr over 20 Minutes Intravenous Every 8 hours 02/26/21 1915                Family Communication/Anticipated D/C date and plan/Code Status   DVT prophylaxis:  apixaban (ELIQUIS) tablet 5 mg     Code Status: Full Code  Family Communication: His brother at the bedside Disposition Plan:    Status is: Inpatient  Remains inpatient appropriate because:IV treatments appropriate due to intensity of illness or inability to take PO  Dispo: The patient is from: Home               Anticipated d/c is to: Home              Patient currently is not medically stable to d/c.   Difficult to place patient No           Subjective:   C/o of general body pain including pain in the left elbow and lower abdominal wall from recent fall.  Objective:    Vitals:   02/27/21 0022 02/27/21 0413 02/27/21 0748 02/27/21 1206  BP:  107/60 138/62 98/70  Pulse:  68 63 70  Resp: 19  (!) 22 18  Temp:  98.4 F (36.9 C) 98.4 F (36.9 C) 98 F (36.7 C)  TempSrc:  Oral Oral Oral  SpO2:  94% 100% 99%  Weight:      Height:       No data found.   Intake/Output Summary (Last 24 hours) at 02/27/2021 1253 Last data filed at 02/27/2021 0921 Gross per 24 hour  Intake 132.48 ml  Output 900 ml  Net -767.52 ml   Filed Weights   02/26/21 0207 02/26/21 1847  Weight: 80.7 kg 85.7 kg    Exam:  GEN: NAD SKIN: Multiple bruises on the right thigh and right leg.  Abrasion on the right knee EYES: EOMI, no pallor or icterus ENT: MMM CV: RRR PULM: CTA B ABD: soft, ND, NT, +BS CNS: AAO x 3, non focal EXT: No edema or tenderness    Pressure Injury 01/07/21 Buttocks Medial;Upper Stage 2 -  Partial thickness loss of dermis presenting as a shallow open injury with a red, pink wound bed without slough. (Active)  01/07/21 1246  Location: Buttocks  Location Orientation: Medial;Upper  Staging: Stage 2 -  Partial thickness loss of dermis presenting as a shallow open injury with a red, pink wound bed without slough.  Wound Description (Comments):   Present on Admission: Yes     Data Reviewed:   I have personally reviewed following labs and imaging studies:  Labs: Labs show the following:   Basic Metabolic Panel: Recent Labs  Lab 02/26/21 0236 02/27/21 0337  NA 127* 132*  K 3.8 4.2  CL 96* 100  CO2 23 24  GLUCOSE 178* 153*  BUN 52* 45*  CREATININE 3.17* 2.52*  CALCIUM 8.8* 9.0   GFR Estimated Creatinine Clearance: 26.2 mL/min (A) (by C-G formula based on SCr of 2.52  mg/dL (H)). Liver Function Tests: Recent Labs  Lab 02/26/21 0236  AST 36  ALT 36  ALKPHOS 94  BILITOT 1.1  PROT 5.2*  ALBUMIN 2.8*   No results for input(s): LIPASE, AMYLASE in the last 168 hours. No results for input(s):  AMMONIA in the last 168 hours. Coagulation profile No results for input(s): INR, PROTIME in the last 168 hours.  CBC: Recent Labs  Lab 02/26/21 0236 02/27/21 0337  WBC 9.2 12.8*  NEUTROABS 7.7  --   HGB 8.9* 9.0*  HCT 25.8* 26.4*  MCV 98.5 98.5  PLT 164 164   Cardiac Enzymes: No results for input(s): CKTOTAL, CKMB, CKMBINDEX, TROPONINI in the last 168 hours. BNP (last 3 results) No results for input(s): PROBNP in the last 8760 hours. CBG: Recent Labs  Lab 02/26/21 1247 02/26/21 1705 02/26/21 2059 02/27/21 0722 02/27/21 1103  GLUCAP 100* 127* 166* 145* 117*   D-Dimer: No results for input(s): DDIMER in the last 72 hours. Hgb A1c: No results for input(s): HGBA1C in the last 72 hours. Lipid Profile: No results for input(s): CHOL, HDL, LDLCALC, TRIG, CHOLHDL, LDLDIRECT in the last 72 hours. Thyroid function studies: No results for input(s): TSH, T4TOTAL, T3FREE, THYROIDAB in the last 72 hours.  Invalid input(s): FREET3 Anemia work up: No results for input(s): VITAMINB12, FOLATE, FERRITIN, TIBC, IRON, RETICCTPCT in the last 72 hours. Sepsis Labs: Recent Labs  Lab 02/26/21 0236 02/27/21 0337  WBC 9.2 12.8*  LATICACIDVEN 0.8  --     Microbiology Recent Results (from the past 240 hour(s))  Resp Panel by RT-PCR (Flu A&B, Covid) Nasopharyngeal Swab     Status: None   Collection Time: 02/26/21  2:36 AM   Specimen: Nasopharyngeal Swab; Nasopharyngeal(NP) swabs in vial transport medium  Result Value Ref Range Status   SARS Coronavirus 2 by RT PCR NEGATIVE NEGATIVE Final    Comment: (NOTE) SARS-CoV-2 target nucleic acids are NOT DETECTED.  The SARS-CoV-2 RNA is generally detectable in upper respiratory specimens during the acute phase of  infection. The lowest concentration of SARS-CoV-2 viral copies this assay can detect is 138 copies/mL. A negative result does not preclude SARS-Cov-2 infection and should not be used as the sole basis for treatment or other patient management decisions. A negative result may occur with  improper specimen collection/handling, submission of specimen other than nasopharyngeal swab, presence of viral mutation(s) within the areas targeted by this assay, and inadequate number of viral copies(<138 copies/mL). A negative result must be combined with clinical observations, patient history, and epidemiological information. The expected result is Negative.  Fact Sheet for Patients:  EntrepreneurPulse.com.au  Fact Sheet for Healthcare Providers:  IncredibleEmployment.be  This test is no t yet approved or cleared by the [place] FDA and  has been authorized for detection and/or diagnosis of SARS-CoV-2 by FDA under an Emergency Use Authorization (EUA). This EUA will remain  in effect (meaning this test can be used) for the duration of the COVID-19 declaration under Section 564(b)(1) of the Act, 21 U.S.C.section 360bbb-3(b)(1), unless the authorization is terminated  or revoked sooner.       Influenza A by PCR NEGATIVE NEGATIVE Final   Influenza B by PCR NEGATIVE NEGATIVE Final    Comment: (NOTE) The Xpert Xpress SARS-CoV-2/FLU/RSV plus assay is intended as an aid in the diagnosis of influenza from Nasopharyngeal swab specimens and should not be used as a sole basis for treatment. Nasal washings and aspirates are unacceptable for Xpert Xpress SARS-CoV-2/FLU/RSV testing.  Fact Sheet for Patients: EntrepreneurPulse.com.au  Fact Sheet for Healthcare Providers: IncredibleEmployment.be  This test is not yet approved or cleared by the [place] FDA and has been authorized for detection and/or diagnosis of SARS-CoV-2  by FDA under an Emergency Use Authorization (EUA). This EUA will remain in  effect (meaning this test can be used) for the duration of the COVID-19 declaration under Section 564(b)(1) of the Act, 21 U.S.C. section 360bbb-3(b)(1), unless the authorization is terminated or revoked.  Performed at [facility], [place] [address], [place], [place] [address]   Blood Culture (routine x 2)     Status: Abnormal (Preliminary result)   Collection Time: 02/26/21  2:36 AM   Specimen: BLOOD  Result Value Ref Range Status   Specimen Description   Final    BLOOD SPECIMEN SOURCE NOT MARKED ON REQUISITION Performed at [place] [address], [place], [place] [address]    Special Requests   Final    BOTTLES DRAWN AEROBIC AND ANAEROBIC Blood Culture adequate volume Performed at [place] [address] [place], [place] [address]    Culture  Setup Time   Final    GRAM POSITIVE COCCI IN CHAINS IN BOTH AEROBIC AND ANAEROBIC BOTTLES Organism ID to follow CRITICAL RESULT CALLED TO, READ BACK BY AND VERIFIED WITH: Cristopher Estimable PHARMD 1902 02/26/21 A BROWNING    Culture (A)  Final    ENTEROCOCCUS FAECALIS SUSCEPTIBILITIES TO FOLLOW Performed at [facility], [place] [address], [place], [place] [address]    Report Status PENDING  Incomplete  Urine culture     Status: Abnormal   Collection Time: 02/26/21  2:36 AM   Specimen: In/Out Cath Urine  Result Value Ref Range Status   Specimen Description   Final    IN/OUT CATH URINE Performed at [place] [address] [place], [place] [address]    Special Requests   Final    NONE Performed at [facility], [place] [address], [place], [place] [address]    Culture (A)  Final    <10,000 COLONIES/mL INSIGNIFICANT GROWTH Performed at [place] [address], [place], [place] [address]    Report Status 02/27/2021 FINAL  Final  Blood Culture ID  Panel (Reflexed)     Status: Abnormal   Collection Time: 02/26/21  2:36 AM  Result Value Ref Range Status   Enterococcus faecalis DETECTED (A) NOT DETECTED Final    Comment: CRITICAL RESULT CALLED TO, READ BACK BY AND VERIFIED WITH: E WILLIAMSON PHARMD 1902 02/26/21 A BROWNING    Enterococcus Faecium NOT DETECTED NOT DETECTED Final   Listeria monocytogenes NOT DETECTED NOT DETECTED Final   Staphylococcus species NOT DETECTED NOT DETECTED Final   Staphylococcus aureus (BCID) NOT DETECTED NOT DETECTED Final   Staphylococcus epidermidis NOT DETECTED NOT DETECTED Final   Staphylococcus lugdunensis NOT DETECTED NOT DETECTED Final   Streptococcus species NOT DETECTED NOT DETECTED Final   Streptococcus agalactiae NOT DETECTED NOT DETECTED Final   Streptococcus pneumoniae NOT DETECTED NOT DETECTED Final   Streptococcus pyogenes NOT DETECTED NOT DETECTED Final   A.calcoaceticus-baumannii NOT DETECTED NOT DETECTED Final   Bacteroides fragilis NOT DETECTED NOT DETECTED Final   Enterobacterales NOT DETECTED NOT DETECTED Final   Enterobacter cloacae complex NOT DETECTED NOT DETECTED Final   Escherichia coli NOT DETECTED NOT DETECTED Final   Klebsiella aerogenes NOT DETECTED NOT DETECTED Final   Klebsiella oxytoca NOT DETECTED NOT DETECTED Final   Klebsiella pneumoniae NOT DETECTED NOT DETECTED Final   Proteus species NOT DETECTED NOT DETECTED Final   Salmonella species NOT DETECTED NOT DETECTED Final   Serratia marcescens NOT DETECTED NOT DETECTED Final   Haemophilus influenzae NOT DETECTED NOT DETECTED Final   Neisseria meningitidis NOT DETECTED  NOT DETECTED Final   Pseudomonas aeruginosa NOT DETECTED NOT DETECTED Final   Stenotrophomonas maltophilia NOT DETECTED NOT DETECTED Final   Candida albicans NOT DETECTED NOT DETECTED Final   Candida auris NOT DETECTED NOT DETECTED Final   Candida glabrata NOT DETECTED NOT DETECTED Final   Candida krusei NOT DETECTED NOT DETECTED Final   Candida  parapsilosis NOT DETECTED NOT DETECTED Final   Candida tropicalis NOT DETECTED NOT DETECTED Final   Cryptococcus neoformans/gattii NOT DETECTED NOT DETECTED Final   Vancomycin resistance NOT DETECTED NOT DETECTED Final    Comment: Performed at [facility], [place] [address] [place], [place] [address]    Procedures and diagnostic studies:  DG Lumbar Spine 2-3 Views  Result Date: 02/26/2021 CLINICAL DATA:  Back pain after several falls EXAM: LUMBAR SPINE - 2-3 VIEW COMPARISON:  Abdominal CT 02/12/2020 FINDINGS: Dextroscoliosis and L5-S1 anterolisthesis. Generalized disc space narrowing with endplate spurring. Facet osteoarthritis especially at L4-5 and L5-S1. No acute fracture or evident bone lesion. Lap band. IMPRESSION: 1. No acute finding. 2. Generalized spinal degeneration with scoliosis and L5-S1 listhesis. Electronically Signed   By: Monte Fantasia M.D.   On: 02/26/2021 06:23   DG Chest Port 1 View  Result Date: 02/26/2021 CLINICAL DATA:  Fever and body aches for 1 week. EXAM: PORTABLE CHEST 1 VIEW COMPARISON:  02/01/2021 FINDINGS: Postoperative changes in the mediastinum. Shallow inspiration. Heart size and pulmonary vascularity are normal for technique. No airspace disease or consolidation. No pleural effusions. No pneumothorax. Calcification of the aorta. IMPRESSION: Shallow inspiration.  No evidence of active pulmonary disease. Electronically Signed   By: Lucienne Capers M.D.   On: 02/26/2021 02:59               LOS: 1 day   Da Michelle  [facility]   Pager on [email or web address]. If 7PM-7AM, please contact night-coverage at [email or web address]     02/27/2021, 12:53 PM

## 2021-02-27 NOTE — Consult Note (Signed)
[facility] Inpatient Consult   02/27/2021  [place] February 23, 1946 [id number]  Patient chart has been reviewed for readmissions less than 30 days and for high risk score for unplanned readmissions. Patient is currently active with [facility] CM chronic care management program for assistance with outpatient chronic disease management.   Plan: Continue to follow for progression and disposition plans and update [facility] RN CCM of patient progress.   Of note, [facility] Care Management services does not replace or interfere with any services that are arranged by inpatient case management or social work.   Netta Cedars, MSN, [facility] Liaison Nurse Mobile Phone [phone]  Toll free office [phone]

## 2021-02-27 NOTE — Plan of Care (Signed)
  Problem: Education: Goal: Knowledge of General Education information will improve Description: Including pain rating scale, medication(s)/side effects and non-pharmacologic comfort measures Outcome: Completed/Met

## 2021-02-27 NOTE — Consult Note (Signed)
[facility]  Total days of antibiotics 2/ampicillin         Reason for Consult:enterococcal bacteremia   Referring Physician: YSAYT  Principal Problem:   Anemia Active Problems:   Type 2 diabetes mellitus without complications (HCC)   Essential hypertension   CAD, AUTOLOGOUS BYPASS GRAFT   Diastolic CHF, chronic (HCC)   Hyponatremia   Weakness generalized   CKD (chronic kidney disease) stage 4, GFR 15-29 ml/min (HCC)   Atrial fibrillation, chronic (HCC)   Chronic anticoagulation   Fever   Prolonged QT interval   Bacteremia due to Gram-positive bacteria    HPI: [name] is a 75 y.o. male with hx of CAD s/p CAB, MVR wit dCHF, DM, AFib, recent stroke, covid x 3- last episode in may, now admitted with 5-7 day history of feeling poorly with nightly fevers. He also has had increasing falls since having recent stroke (initially right leg weakness with foot drop)- noticing back pain. Due to ongoing fevers and malaise, he came to ED for evaluation. He was found to have leukocytosis of 12.8K, low grade fever. His infectious work up found enterococcal bacteremia.he was started on ampicillin.   Past Medical History:  Diagnosis Date   Allergic rhinitis    Anxiety    Atrial fibrillation (HCC)    Cancer (HCC)    hx of skin cancers    Chronic kidney disease    Coronary artery disease    a. s/p MI & CABG; b. s/p PCI Diag;  c. Cath 12/2011: LAD diff dzs/small, Diag 75% isr, 90 dist to stent (small), LCX & RCA occluded, VG->OM 40, VG->PDA patent - med rx.   Depression    PTSD   Diabetes mellitus    not on medications   Diastolic CHF, chronic ([place])    a. EF 55-60% by echo 2007   GERD (gastroesophageal reflux disease)    "not anymore" " I had a lap band"   History of diverticulitis of colon    5 YRS AGO   History of pneumonia    2017   History of transient ischemic attack (TIA)    CAROTID DOPPLERS NOV 2011  0-39& BIL. 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   Complex valvuloplasty including artificial Gore-tex neochord placement x4, plication of anterior commissure, and 26 mm Sorin Memo 3D ring annuloplasty via right mini thoracotomy approach   Shortness of breath dyspnea    with exertion   Skin cancer    Sleep apnea    cpap- see ov note in EPIC 05/12/11 for settings    Stroke ([place])    hx of   Stroke [facility])     Allergies:  Allergies  Allergen Reactions   Crestor [Rosuvastatin] Other (See Comments)    Muscle aches    Lipitor [Atorvastatin] Other (See Comments)    Muscle aches   Pravastatin Other (See Comments)    Muscle aches   Carvedilol     Other reaction(s): loss of appetite   Metformin Diarrhea   Serotonin Reuptake Inhibitors (Ssris)  Other reaction(s): Muscle aches   Zocor [Simvastatin]     Other reaction(s): Muscle pain   Codeine Itching and Other (See Comments)    Extremity tingling-- can take synthetic     MEDICATIONS:  amiodarone  200 mg Oral Daily   apixaban  5 mg Oral BID   calcitRIOL  0.25 mcg Oral QODAY   chlorhexidine  15 mL Mouth Rinse BID   insulin aspart  0-5 Units Subcutaneous QHS   insulin aspart  0-9 Units Subcutaneous TID WC   loratadine  10 mg Oral Daily   mouth rinse  15 mL Mouth Rinse q12n4p   traZODone  100 mg Oral QHS    Social History   Tobacco Use   Smoking status: Never   Smokeless tobacco: Never  Vaping Use   Vaping Use: Never used  Substance Use Topics   Alcohol use: Not Currently    Alcohol/week: 2.0 standard drinks    Types: 1 Glasses of wine, 1 Shots of liquor per week    Comment: OCCASIONAL   Drug use: Never    Family History   Problem Relation Age of Onset   Other Mother        joint problems   Alzheimer's disease Mother    Hypertension Father    Heart disease Father    CVA Father        age 56   Depression Father    Heart disease Sister        CABG   Stroke Sister    Heart failure Sister        congestive   Diabetes Sister    Heart disease Brother    Hypertension Brother    Congestive Heart Failure Brother     Review of Systems -   Constitutional: positive for fever, chills, diaphoresis, activity change, appetite change, fatigue and unexpected weight change.  HENT: Negative for congestion, sore throat, rhinorrhea, sneezing, trouble swallowing and sinus pressure.  Eyes: Negative for photophobia and visual disturbance.  Respiratory: Negative for cough, chest tightness, shortness of breath, wheezing and stridor.  Cardiovascular: Negative for chest pain, palpitations and leg swelling.  Gastrointestinal: Negative for nausea, vomiting, abdominal pain, diarrhea, constipation, blood in stool, abdominal distention and anal bleeding.  Genitourinary: Negative for dysuria, hematuria, flank pain and difficulty urinating.  Musculoskeletal: Negative for myalgias, back pain, joint swelling, arthralgias and gait problem.  Skin: Negative for color change, pallor, rash and wound.  Neurological: Negative for dizziness, tremors, weakness and light-headedness.  Hematological: Negative for adenopathy. Does not bruise/bleed easily.  Psychiatric/Behavioral: Negative for behavioral problems, confusion, sleep disturbance, dysphoric mood, decreased concentration and agitation.     OBJECTIVE: Temp:  [98 F (36.7 C)-99.6 F (37.6 C)] 98 F (36.7 C) (07/07 1206) Pulse Rate:  [54-70] 70 (07/07 1206) Resp:  [16-24] 18 (07/07 1206) BP: (98-144)/(57-78) 98/70 (07/07 1206) SpO2:  [91 %-100 %] 99 % (07/07 1206) Weight:  [85.7 kg] 85.7 kg (07/06 1847) Physical Exam  Constitutional: He is oriented to person, place, and time.  He appears well-developed and well-nourished. No distress.  HENT:  Mouth/Throat: Oropharynx is clear and moist. No oropharyngeal exudate.  Cardiovascular: Normal rate, regular rhythm and normal heart sounds. Exam reveals no gallop and no friction rub.  No murmur heard.  Pulmonary/Chest: Effort normal and breath sounds normal. No respiratory distress. He has no wheezes.  Abdominal: Soft. Bowel sounds are normal. He exhibits no distension. There is no tenderness.  Lymphadenopathy:  He has no cervical adenopathy.  Neurological:  He is alert and oriented to person, place, and time. Dorsiflexion limited on right foot Skin: Skin is warm and dry. No rash noted. No erythema.  Psychiatric: He has a normal mood and affect. His behavior is normal.    LABS: Results for orders placed or performed during the hospital encounter of 02/26/21 (from the past 48 hour(s))  Resp Panel by RT-PCR (Flu A&B, Covid) Nasopharyngeal Swab     Status: None   Collection Time: 02/26/21  2:36 AM   Specimen: Nasopharyngeal Swab; Nasopharyngeal(NP) swabs in vial transport medium  Result Value Ref Range   SARS Coronavirus 2 by RT PCR NEGATIVE NEGATIVE    Comment: (NOTE) SARS-CoV-2 target nucleic acids are NOT DETECTED.  The SARS-CoV-2 RNA is generally detectable in upper respiratory specimens during the acute phase of infection. The lowest concentration of SARS-CoV-2 viral copies this assay can detect is 138 copies/mL. A negative result does not preclude SARS-Cov-2 infection and should not be used as the sole basis for treatment or other patient management decisions. A negative result may occur with  improper specimen collection/handling, submission of specimen other than nasopharyngeal swab, presence of viral mutation(s) within the areas targeted by this assay, and inadequate number of viral copies(<138 copies/mL). A negative result must be combined with clinical observations, patient history, and  epidemiological information. The expected result is Negative.  Fact Sheet for Patients:  EntrepreneurPulse.com.au  Fact Sheet for Healthcare Providers:  IncredibleEmployment.be  This test is no t yet approved or cleared by the [place] FDA and  has been authorized for detection and/or diagnosis of SARS-CoV-2 by FDA under an Emergency Use Authorization (EUA). This EUA will remain  in effect (meaning this test can be used) for the duration of the COVID-19 declaration under Section 564(b)(1) of the Act, 21 U.S.C.section 360bbb-3(b)(1), unless the authorization is terminated  or revoked sooner.       Influenza A by PCR NEGATIVE NEGATIVE   Influenza B by PCR NEGATIVE NEGATIVE    Comment: (NOTE) The Xpert Xpress SARS-CoV-2/FLU/RSV plus assay is intended as an aid in the diagnosis of influenza from Nasopharyngeal swab specimens and should not be used as a sole basis for treatment. Nasal washings and aspirates are unacceptable for Xpert Xpress SARS-CoV-2/FLU/RSV testing.  Fact Sheet for Patients: EntrepreneurPulse.com.au  Fact Sheet for Healthcare Providers: IncredibleEmployment.be  This test is not yet approved or cleared by the [place] FDA and has been authorized for detection and/or diagnosis of SARS-CoV-2 by FDA under an Emergency Use Authorization (EUA). This EUA will remain in effect (meaning this test can be used) for the duration of the COVID-19 declaration under Section 564(b)(1) of the Act, 21 U.S.C. section 360bbb-3(b)(1), unless the authorization is terminated or revoked.  Performed at [facility], [place] [address] [place], [place] [address]   Lactic acid, plasma     Status: None   Collection Time: 02/26/21  2:36 AM  Result Value Ref Range   Lactic Acid, Venous 0.8 0.5 - 1.9 mmol/L    Comment: Performed at [facility], [place] [address]  [place], [place] [address]  Comprehensive metabolic panel     Status: Abnormal   Collection Time: 02/26/21  2:36 AM  Result Value Ref Range   Sodium 127 (L) 135 - 145 mmol/L   Potassium 3.8 3.5 - 5.1 mmol/L   Chloride 96 (L) 98 - 111 mmol/L   CO2 23 22 - 32 mmol/L   Glucose, Bld 178 (H) 70 - 99 mg/dL  Comment: Glucose reference range applies only to samples taken after fasting for at least 8 hours.   BUN 52 (H) 8 - 23 mg/dL   Creatinine, Ser 3.17 (H) 0.61 - 1.24 mg/dL   Calcium 8.8 (L) 8.9 - 10.3 mg/dL   Total Protein 5.2 (L) 6.5 - 8.1 g/dL   Albumin 2.8 (L) 3.5 - 5.0 g/dL   AST 36 15 - 41 U/L   ALT 36 0 - 44 U/L   Alkaline Phosphatase 94 38 - 126 U/L   Total Bilirubin 1.1 0.3 - 1.2 mg/dL   GFR, Estimated 20 (L) >60 mL/min    Comment: (NOTE) Calculated using the CKD-EPI Creatinine Equation (2021)    Anion gap 8 5 - 15    Comment: Performed at [facility], [place] [address] [place], [place] [address]  CBC WITH DIFFERENTIAL     Status: Abnormal   Collection Time: 02/26/21  2:36 AM  Result Value Ref Range   WBC 9.2 4.0 - 10.5 K/uL   RBC 2.62 (L) 4.22 - 5.81 MIL/uL   Hemoglobin 8.9 (L) 13.0 - 17.0 g/dL   HCT 25.8 (L) 39.0 - 52.0 %   MCV 98.5 80.0 - 100.0 fL   MCH 34.0 26.0 - 34.0 pg   MCHC 34.5 30.0 - 36.0 g/dL   RDW 13.3 11.5 - 15.5 %   Platelets 164 150 - 400 K/uL   nRBC 0.0 0.0 - 0.2 %   Neutrophils Relative % 84 %   Neutro Abs 7.7 1.7 - 7.7 K/uL   Lymphocytes Relative 6 %   Lymphs Abs 0.5 (L) 0.7 - 4.0 K/uL   Monocytes Relative 10 %   Monocytes Absolute 1.0 0.1 - 1.0 K/uL   Eosinophils Relative 0 %   Eosinophils Absolute 0.0 0.0 - 0.5 K/uL   Basophils Relative 0 %   Basophils Absolute 0.0 0.0 - 0.1 K/uL   Immature Granulocytes 0 %   Abs Immature Granulocytes 0.04 0.00 - 0.07 K/uL    Comment: Performed at [facility], [place] [address], [place], [place] [address]  Blood Culture (routine x 2)     Status: Abnormal (Preliminary result)    Collection Time: 02/26/21  2:36 AM   Specimen: BLOOD  Result Value Ref Range   Specimen Description      BLOOD SPECIMEN SOURCE NOT MARKED ON REQUISITION Performed at [place] [address] [place], [place] [address]    Special Requests      BOTTLES DRAWN AEROBIC AND ANAEROBIC Blood Culture adequate volume Performed at [place] [address] [place], [place] [address]    Culture  Setup Time      GRAM POSITIVE COCCI IN CHAINS IN BOTH AEROBIC AND ANAEROBIC BOTTLES Organism ID to follow CRITICAL RESULT CALLED TO, READ BACK BY AND VERIFIED WITH: Cristopher Estimable PHARMD 1902 02/26/21 A BROWNING    Culture (A)     ENTEROCOCCUS FAECALIS SUSCEPTIBILITIES TO FOLLOW Performed at [facility], [place] [address] [place], [place] [address]    Report Status PENDING   Urinalysis, Routine w reflex microscopic     Status: Abnormal   Collection Time: 02/26/21  2:36 AM  Result Value Ref Range   Color, Urine YELLOW YELLOW   APPearance CLEAR CLEAR   Specific Gravity, Urine 1.010 1.005 - 1.030   pH 5.0 5.0 - 8.0   Glucose, UA NEGATIVE NEGATIVE mg/dL   Hgb urine dipstick MODERATE (A) NEGATIVE   Bilirubin  Urine NEGATIVE NEGATIVE   Ketones, ur NEGATIVE NEGATIVE mg/dL   Protein, ur NEGATIVE NEGATIVE mg/dL   Nitrite NEGATIVE NEGATIVE   Leukocytes,Ua NEGATIVE NEGATIVE   RBC / HPF 21-50 0 - 5 RBC/hpf   WBC, UA 0-5 0 - 5 WBC/hpf   Bacteria, UA RARE (A) NONE SEEN   Squamous Epithelial / LPF 0-5 0 - 5   Mucus PRESENT     Comment: Performed at [facility], [place] [address] [place], [place] [address]  Urine culture     Status: Abnormal   Collection Time: 02/26/21  2:36 AM   Specimen: In/Out Cath Urine  Result Value Ref Range   Specimen Description      IN/OUT CATH URINE Performed at [facility], [place] [address], [place], [place] [address]    Special Requests      NONE Performed at [facility],  [place] [address], [place], [place] [address]    Culture (A)     <10,000 COLONIES/mL INSIGNIFICANT GROWTH Performed at [place] [address], [place], [place] [address]    Report Status 02/27/2021 FINAL   Blood Culture ID Panel (Reflexed)     Status: Abnormal   Collection Time: 02/26/21  2:36 AM  Result Value Ref Range   Enterococcus faecalis DETECTED (A) NOT DETECTED    Comment: CRITICAL RESULT CALLED TO, READ BACK BY AND VERIFIED WITH: Cristopher Estimable PHARMD 1902 02/26/21 A BROWNING    Enterococcus Faecium NOT DETECTED NOT DETECTED   Listeria monocytogenes NOT DETECTED NOT DETECTED   Staphylococcus species NOT DETECTED NOT DETECTED   Staphylococcus aureus (BCID) NOT DETECTED NOT DETECTED   Staphylococcus epidermidis NOT DETECTED NOT DETECTED   Staphylococcus lugdunensis NOT DETECTED NOT DETECTED   Streptococcus species NOT DETECTED NOT DETECTED   Streptococcus agalactiae NOT DETECTED NOT DETECTED   Streptococcus pneumoniae NOT DETECTED NOT DETECTED   Streptococcus pyogenes NOT DETECTED NOT DETECTED   A.calcoaceticus-baumannii NOT DETECTED NOT DETECTED   Bacteroides fragilis NOT DETECTED NOT DETECTED   Enterobacterales NOT DETECTED NOT DETECTED   Enterobacter cloacae complex NOT DETECTED NOT DETECTED   Escherichia coli NOT DETECTED NOT DETECTED   Klebsiella aerogenes NOT DETECTED NOT DETECTED   Klebsiella oxytoca NOT DETECTED NOT DETECTED   Klebsiella pneumoniae NOT DETECTED NOT DETECTED   Proteus species NOT DETECTED NOT DETECTED   Salmonella species NOT DETECTED NOT DETECTED   Serratia marcescens NOT DETECTED NOT DETECTED   Haemophilus influenzae NOT DETECTED NOT DETECTED   Neisseria meningitidis NOT DETECTED NOT DETECTED   Pseudomonas aeruginosa NOT DETECTED NOT DETECTED   Stenotrophomonas maltophilia NOT DETECTED NOT DETECTED   Candida albicans NOT DETECTED NOT DETECTED   Candida auris NOT DETECTED NOT DETECTED   Candida glabrata NOT DETECTED NOT DETECTED    Candida krusei NOT DETECTED NOT DETECTED   Candida parapsilosis NOT DETECTED NOT DETECTED   Candida tropicalis NOT DETECTED NOT DETECTED   Cryptococcus neoformans/gattii NOT DETECTED NOT DETECTED   Vancomycin resistance NOT DETECTED NOT DETECTED    Comment: Performed at [facility], [address] [place], [place] [address]  POC occult blood, ED     Status: None   Collection Time: 02/26/21  5:03 AM  Result Value Ref Range   Fecal Occult Bld NEGATIVE NEGATIVE  CBG monitoring, ED     Status: Abnormal   Collection Time: 02/26/21  8:19 AM  Result Value Ref Range   Glucose-Capillary 130 (H) 70 - 99 mg/dL  Comment: Glucose reference range applies only to samples taken after fasting for at least 8 hours.  Brain natriuretic peptide     Status: Abnormal   Collection Time: 02/26/21  9:01 AM  Result Value Ref Range   B Natriuretic Peptide 762.7 (H) 0.0 - 100.0 pg/mL    Comment: Performed at [facility], [place] [address] [place], [place] [address]  Troponin I (High Sensitivity)     Status: Abnormal   Collection Time: 02/26/21  9:01 AM  Result Value Ref Range   Troponin I (High Sensitivity) 27 (H) <18 ng/L    Comment: (NOTE) Elevated high sensitivity troponin I (hsTnI) values and significant  changes across serial measurements may suggest ACS but many other  chronic and acute conditions are known to elevate hsTnI results.  Refer to the "Links" section for chest pain algorithms and additional  guidance. Performed at [facility], [place] [address] [place], [place] [address]   Troponin I (High Sensitivity)     Status: Abnormal   Collection Time: 02/26/21 11:38 AM  Result Value Ref Range   Troponin I (High Sensitivity) 25 (H) <18 ng/L    Comment: (NOTE) Elevated high sensitivity troponin I (hsTnI) values and significant  changes across serial measurements may suggest ACS but many other  chronic and acute conditions are known to elevate hsTnI  results.  Refer to the "Links" section for chest pain algorithms and additional  guidance. Performed at [facility], [place] [address], [place], [place] [address]   CBG monitoring, ED     Status: Abnormal   Collection Time: 02/26/21 12:47 PM  Result Value Ref Range   Glucose-Capillary 100 (H) 70 - 99 mg/dL    Comment: Glucose reference range applies only to samples taken after fasting for at least 8 hours.  Troponin I (High Sensitivity)     Status: Abnormal   Collection Time: 02/26/21  2:51 PM  Result Value Ref Range   Troponin I (High Sensitivity) 27 (H) <18 ng/L    Comment: (NOTE) Elevated high sensitivity troponin I (hsTnI) values and significant  changes across serial measurements may suggest ACS but many other  chronic and acute conditions are known to elevate hsTnI results.  Refer to the "Links" section for chest pain algorithms and additional  guidance. Performed at [facility], [place] [address] [place], [place] [address]   CBG monitoring, ED     Status: Abnormal   Collection Time: 02/26/21  5:05 PM  Result Value Ref Range   Glucose-Capillary 127 (H) 70 - 99 mg/dL    Comment: Glucose reference range applies only to samples taken after fasting for at least 8 hours.  Glucose, capillary     Status: Abnormal   Collection Time: 02/26/21  8:59 PM  Result Value Ref Range   Glucose-Capillary 166 (H) 70 - 99 mg/dL    Comment: Glucose reference range applies only to samples taken after fasting for at least 8 hours.  Basic metabolic panel     Status: Abnormal   Collection Time: 02/27/21  3:37 AM  Result Value Ref Range   Sodium 132 (L) 135 - 145 mmol/L   Potassium 4.2 3.5 - 5.1 mmol/L   Chloride 100 98 - 111 mmol/L   CO2 24 22 - 32 mmol/L   Glucose, Bld 153 (H) 70 - 99 mg/dL    Comment: Glucose reference range applies only to samples taken after fasting for at least 8 hours.   BUN  45 (H) 8 - 23 mg/dL   Creatinine, Ser 2.52 (H) 0.61 - 1.24  mg/dL   Calcium 9.0 8.9 - 10.3 mg/dL   GFR, Estimated 26 (L) >60 mL/min    Comment: (NOTE) Calculated using the CKD-EPI Creatinine Equation (2021)    Anion gap 8 5 - 15    Comment: Performed at [facility], [place] [address] [place], [place] [address]  CBC     Status: Abnormal   Collection Time: 02/27/21  3:37 AM  Result Value Ref Range   WBC 12.8 (H) 4.0 - 10.5 K/uL   RBC 2.68 (L) 4.22 - 5.81 MIL/uL   Hemoglobin 9.0 (L) 13.0 - 17.0 g/dL   HCT 26.4 (L) 39.0 - 52.0 %   MCV 98.5 80.0 - 100.0 fL   MCH 33.6 26.0 - 34.0 pg   MCHC 34.1 30.0 - 36.0 g/dL   RDW 13.5 11.5 - 15.5 %   Platelets 164 150 - 400 K/uL   nRBC 0.0 0.0 - 0.2 %    Comment: Performed at [facility], [place] [address] [place], [place] [address]  Glucose, capillary     Status: Abnormal   Collection Time: 02/27/21  7:22 AM  Result Value Ref Range   Glucose-Capillary 145 (H) 70 - 99 mg/dL    Comment: Glucose reference range applies only to samples taken after fasting for at least 8 hours.  Glucose, capillary     Status: Abnormal   Collection Time: 02/27/21 11:03 AM  Result Value Ref Range   Glucose-Capillary 117 (H) 70 - 99 mg/dL    Comment: Glucose reference range applies only to samples taken after fasting for at least 8 hours.    MICRO:  IMAGING: DG Lumbar Spine 2-3 Views  Result Date: 02/26/2021 CLINICAL DATA:  Back pain after several falls EXAM: LUMBAR SPINE - 2-3 VIEW COMPARISON:  Abdominal CT 02/12/2020 FINDINGS: Dextroscoliosis and L5-S1 anterolisthesis. Generalized disc space narrowing with endplate spurring. Facet osteoarthritis especially at L4-5 and L5-S1. No acute fracture or evident bone lesion. Lap band. IMPRESSION: 1. No acute finding. 2. Generalized spinal degeneration with scoliosis and L5-S1 listhesis. Electronically Signed   By: Monte Fantasia M.D.   On: 02/26/2021 06:23   DG Chest Port 1 View  Result Date: 02/26/2021 CLINICAL DATA:  Fever and body aches for 1  week. EXAM: PORTABLE CHEST 1 VIEW COMPARISON:  02/01/2021 FINDINGS: Postoperative changes in the mediastinum. Shallow inspiration. Heart size and pulmonary vascularity are normal for technique. No airspace disease or consolidation. No pleural effusions. No pneumothorax. Calcification of the aorta. IMPRESSION: Shallow inspiration.  No evidence of active pulmonary disease. Electronically Signed   By: Lucienne Capers M.D.   On: 02/26/2021 02:59    HISTORICAL MICRO/IMAGING  Assessment/Plan:  75yo M with hx of MV repair now with enterococcal bacteremia - recommend to do dual therapy with ceftriaxone plus ampicillin - please get TEE to evaluate for endocarditis especially of MV repair  -given recurrent falls, recommmend to repeat N[facility]CT but would need to do MRI of spine to see if has evidence of discitis  Urinary difficulty = did not have uti but was slated to go to urology. Will continue to monitor for symptoms.  [facility] [phone]

## 2021-02-27 NOTE — Evaluation (Signed)
Physical Therapy Evaluation Patient Details Name: [name] MRN: [id number] DOB: February 04, 1946 Today's Date: 02/27/2021   History of Present Illness  Patient is a 75 y.o. male admitted for anemia. Past medical history significant for atrial fibrillation, CAD, chronic kidney disease stage IV, history of stroke last month, diabetes mellitus type 2, HFpEF, mitral valve repair 5 years ago, gout, OA  Clinical Impression  Pt admitted with above diagnosis. Pt currently with functional limitations due to the deficits listed below (see PT Problem List). Pt will benefit from skilled PT to increase their independence and safety with mobility to allow discharge to the venue listed below.  Pt reports low back pain and left knee pain after fall at home limiting his ability to ambulate today.  Pt with hx of recent CVA and was working with [facility]  Pt reports multiple family members at home and has w/c if needed to mobilize safely.      Follow Up Recommendations Outpatient PT (continue OPPT)    Equipment Recommendations  None recommended by PT    Recommendations for Other Services       Precautions / Restrictions Precautions Precautions: Fall Precaution Comments: Left knee area pain, R decreased DF Restrictions Weight Bearing Restrictions: No      Mobility  Bed Mobility Overal bed mobility: Needs Assistance Bed Mobility: Supine to Sit     Supine to sit: Min assist;HOB elevated     General bed mobility comments: pt in recliner    Transfers Overall transfer level: Needs assistance Equipment used: Rolling walker (2 wheeled) Transfers: Sit to/from Stand Sit to Stand: Min guard Stand pivot transfers: Min assist       General transfer comment: increased time and effort, min/guard for safety, pt requested to stand for a few minutes due to pain  Ambulation/Gait Ambulation/Gait assistance: Min guard Gait Distance (Feet): 25 Feet Assistive device: Rolling walker (2  wheeled) Gait Pattern/deviations: Step-to pattern;Decreased dorsiflexion - right     General Gait Details: verbal cues for use of UEs to assist with pain and LE weakness, increased time and effort  Stairs            Wheelchair Mobility    Modified Rankin (Stroke Patients Only)       Balance Overall balance assessment: History of Falls;Needs assistance Sitting-balance support: No upper extremity supported;Feet supported Sitting balance-Leahy Scale: Good     Standing balance support: During functional activity Standing balance-Leahy Scale: Fair                               Pertinent Vitals/Pain Pain Assessment: 0-10 Pain Score: 8  Faces Pain Scale: Hurts whole lot Pain Location: left knee and low back Pain Descriptors / Indicators: Aching;Grimacing;Guarding;Sharp Pain Intervention(s): Monitored during session;Repositioned;Premedicated before session    Home Living Family/patient expects to be discharged to:: Private residence Living Arrangements: Spouse/significant other Available Help at Discharge: Family;Available 24 hours/day Type of Home: House Home Access: Level entry     Home Layout: Multi-level;Able to live on main level with bedroom/bathroom Home Equipment: Shower seat;Walker - 2 wheels;Cane - single point;Bedside commode;Wheelchair - manual Additional Comments: Reports using a cane since discharge. Hasn't had a chance to get AFO yet.    Prior Function Level of Independence: Independent         Comments: From Prior Admission: likes to swing dance, wife was an [place] at [employer] for 40 years. Has been mostly sitting since  most recent covid infection     Hand Dominance   Dominant Hand: Right    Extremity/Trunk Assessment   Upper Extremity Assessment Upper Extremity Assessment: RUE deficits/detail;LUE deficits/detail RUE Deficits / Details: WFL ROM, overall 4/5 strength RUE Sensation: decreased light touch (dull) RUE Coordination:  decreased fine motor (WFL. Reports finger to thumb slower) LUE Deficits / Details: WFL ROM and overall 4/5 strength, pain in left shoulder LUE Sensation: WNL LUE Coordination: WNL    Lower Extremity Assessment Lower Extremity Assessment: RLE deficits/detail;LLE deficits/detail RLE Deficits / Details: pt reports mild weakness since CVA, decreased AROM DF (pt reports foot drop but able to lift against gravity, 2+/5) LLE Deficits / Details: reports knee pain and instability s/p fall prior to admission    Cervical / Trunk Assessment Cervical / Trunk Assessment: Normal  Communication   Communication: No difficulties  Cognition Arousal/Alertness: Awake/alert Behavior During Therapy: WFL for tasks assessed/performed Overall Cognitive Status: Within Functional Limits for tasks assessed                                        General Comments      Exercises     Assessment/Plan    PT Assessment Patient needs continued PT services  PT Problem List Decreased knowledge of use of DME;Decreased balance;Decreased strength;Decreased mobility       PT Treatment Interventions Gait training;DME instruction;Therapeutic exercise;Balance training;Functional mobility training;Therapeutic activities;Patient/family education    PT Goals (Current goals can be found in the Care Plan section)  Acute Rehab PT Goals Patient Stated Goal: walk without falling PT Goal Formulation: With patient Time For Goal Achievement: 03/13/21 Potential to Achieve Goals: Good    Frequency Min 3X/week   Barriers to discharge        Co-evaluation               AM-PAC PT "6 Clicks" Mobility  Outcome Measure Help needed turning from your back to your side while in a flat bed without using bedrails?: A Little Help needed moving from lying on your back to sitting on the side of a flat bed without using bedrails?: A Little Help needed moving to and from a bed to a chair (including a  wheelchair)?: A Little Help needed standing up from a chair using your arms (e.g., wheelchair or bedside chair)?: A Little Help needed to walk in hospital room?: A Little Help needed climbing 3-5 steps with a railing? : A Lot 6 Click Score: 17    End of Session Equipment Utilized During Treatment: Gait belt Activity Tolerance: Patient tolerated treatment well Patient left: in chair;with call bell/phone within reach;with chair alarm set;with family/visitor present   PT Visit Diagnosis: Other abnormalities of gait and mobility (R26.89)    Time: 5573-2202 PT Time Calculation (min) (ACUTE ONLY): 30 min   Charges:   PT Evaluation $PT Eval Low Complexity: 1 Low PT Treatments $Gait Training: 8-22 mins      Jannette Spanner PT, DPT Acute Rehabilitation Services Pager: [phone] Office: [phone]   Daisha Filosa,KATHrine E 02/27/2021, 11:59 AM

## 2021-02-28 ENCOUNTER — Inpatient Hospital Stay (HOSPITAL_COMMUNITY): Payer: No Typology Code available for payment source

## 2021-02-28 DIAGNOSIS — N184 Chronic kidney disease, stage 4 (severe): Secondary | ICD-10-CM | POA: Diagnosis not present

## 2021-02-28 DIAGNOSIS — M5440 Lumbago with sciatica, unspecified side: Secondary | ICD-10-CM | POA: Diagnosis not present

## 2021-02-28 DIAGNOSIS — T07XXXA Unspecified multiple injuries, initial encounter: Secondary | ICD-10-CM | POA: Diagnosis not present

## 2021-02-28 DIAGNOSIS — R7881 Bacteremia: Secondary | ICD-10-CM | POA: Diagnosis not present

## 2021-02-28 LAB — CBC WITH DIFFERENTIAL/PLATELET
Abs Immature Granulocytes: 0.1 10*3/uL — ABNORMAL HIGH (ref 0.00–0.07)
Basophils Absolute: 0 10*3/uL (ref 0.0–0.1)
Basophils Relative: 0 %
Eosinophils Absolute: 0.3 10*3/uL (ref 0.0–0.5)
Eosinophils Relative: 3 %
HCT: 28.1 % — ABNORMAL LOW (ref 39.0–52.0)
Hemoglobin: 9.7 g/dL — ABNORMAL LOW (ref 13.0–17.0)
Immature Granulocytes: 1 %
Lymphocytes Relative: 11 %
Lymphs Abs: 1.1 10*3/uL (ref 0.7–4.0)
MCH: 33.4 pg (ref 26.0–34.0)
MCHC: 34.5 g/dL (ref 30.0–36.0)
MCV: 96.9 fL (ref 80.0–100.0)
Monocytes Absolute: 1.1 10*3/uL — ABNORMAL HIGH (ref 0.1–1.0)
Monocytes Relative: 11 %
Neutro Abs: 7.7 10*3/uL (ref 1.7–7.7)
Neutrophils Relative %: 74 %
Platelets: 196 10*3/uL (ref 150–400)
RBC: 2.9 MIL/uL — ABNORMAL LOW (ref 4.22–5.81)
RDW: 13.5 % (ref 11.5–15.5)
WBC: 10.3 10*3/uL (ref 4.0–10.5)
nRBC: 0 % (ref 0.0–0.2)

## 2021-02-28 LAB — BASIC METABOLIC PANEL
Anion gap: 8 (ref 5–15)
BUN: 45 mg/dL — ABNORMAL HIGH (ref 8–23)
CO2: 24 mmol/L (ref 22–32)
Calcium: 9.4 mg/dL (ref 8.9–10.3)
Chloride: 98 mmol/L (ref 98–111)
Creatinine, Ser: 2.73 mg/dL — ABNORMAL HIGH (ref 0.61–1.24)
GFR, Estimated: 24 mL/min — ABNORMAL LOW (ref >60)
Glucose, Bld: 121 mg/dL — ABNORMAL HIGH (ref 70–99)
Potassium: 4.2 mmol/L (ref 3.5–5.1)
Sodium: 130 mmol/L — ABNORMAL LOW (ref 135–145)

## 2021-02-28 LAB — CULTURE, BLOOD (ROUTINE X 2): Special Requests: ADEQUATE

## 2021-02-28 LAB — GLUCOSE, CAPILLARY
Glucose-Capillary: 128 mg/dL — ABNORMAL HIGH (ref 70–99)
Glucose-Capillary: 116 mg/dL — ABNORMAL HIGH (ref 70–99)
Glucose-Capillary: 163 mg/dL — ABNORMAL HIGH (ref 70–99)
Glucose-Capillary: 142 mg/dL — ABNORMAL HIGH (ref 70–99)

## 2021-02-28 NOTE — Progress Notes (Addendum)
Progress Note    [name]  QPR:[id number] DOB: [birth year]  DOA: 02/26/2021 PCP: [name], MD      Brief Narrative:    Medical records reviewed and are as summarized below:  [name] is a 75 y.o. male  with medical history significant for atrial fibrillation, CAD, chronic kidney disease stage IV, history of stroke last month, diabetes mellitus type 2, HFpEF, mitral valve repair 5 years ago, gout, osteoarthritis, who presented to the hospital because of generalized weakness, frequent falls at home and fever.  He said symptoms have been going on for about a week now.  He had multiple falls at home that caused him to have a lot of bruises on his back, hip and lower extremities.  He also has generalized body pain from his fall.  He was found to have acute hyponatremia and he was given IV fluids for this.  Blood culture also revealed Enterococcus faecalis so he was started on empiric IV antibiotics.      Assessment/Plan:   Principal Problem:   Anemia Active Problems:   Type 2 diabetes mellitus without complications (HCC)   Essential hypertension   CAD, AUTOLOGOUS BYPASS GRAFT   Diastolic CHF, chronic (HCC)   Hyponatremia   Weakness generalized   CKD (chronic kidney disease) stage 4, GFR 15-29 ml/min (HCC)   Atrial fibrillation, chronic (HCC)   Chronic anticoagulation   Fever   Prolonged QT interval   Bacteremia due to Gram-positive bacteria    Body mass index is 27.27 kg/m.  Acute hyponatremia: Sodium level is trending down.  Monitor off of IV fluids.  Fever at home, Enterococcus faecalis bacteremia: Continue empiric IV antibiotics.  Case discussed with Trish from [employer].  She will have somebody see the patient today.  She has a expressed concern about drop in hemoglobin from 12-9 from about a month ago.  Next available date for TEE is Tuesday, 03/04/2021.  She wants to see a better hemoglobin.  Repeat CBC tomorrow.  Awaiting cardiology  evaluation for TEE.  Follow-up with ID.  Low back pain: MRI lumbar spine with contrast cannot be done because of CKD stage IV.  Obtain MRI lumbar spine without contrast for further evaluation of low back pain given Enterococcus faecalis bacteremia and recent fall.  Analgesics as needed for pain.  Continue to monitor.  Chronic diastolic CHF: Compensated.  CKD stage IV: Creatinine is stable  Chronic atrial fibrillation: Continue amiodarone and Eliquis.  Prolonged QT interval (569), RBBB and LAFB on EKG from 02/26/2021: Repeat EKG on 02/27/2021 showed LAD, LBBB, prolonged QTC (503).  Anemia of chronic disease: Hemoglobin is stable.  Stool for occult blood was negative.  Mildly elevated but flat troponins: This is likely from demand ischemia.  Type II DM: NovoLog as needed for hyperglycemia.  CAD and history of stroke: Patient said he has experienced easy bruising since addition of aspirin to Eliquis, and he prefers to discontinue aspirin.  Generalized weakness, multiple falls at home: Outpatient PT was recommended.    Diet Order             Diet NPO time specified Except for: Sips with Meds  Diet effective now                      Consultants: Infectious disease Cardiologist for TEE  Procedures: None    Medications:    amiodarone  200 mg Oral Daily   apixaban  5 mg Oral BID  calcitRIOL  0.25 mcg Oral QODAY   chlorhexidine  15 mL Mouth Rinse BID   insulin aspart  0-5 Units Subcutaneous QHS   insulin aspart  0-9 Units Subcutaneous TID WC   loratadine  10 mg Oral Daily   mouth rinse  15 mL Mouth Rinse q12n4p   traZODone  100 mg Oral QHS   Continuous Infusions:  sodium chloride Stopped (02/27/21 1600)   ampicillin (OMNIPEN) IV 2 g (02/28/21 0406)   cefTRIAXone (ROCEPHIN)  IV 2 g (02/27/21 2149)     Anti-infectives (From admission, onward)    Start     Dose/Rate Route Frequency Ordered Stop   02/27/21 1430  cefTRIAXone (ROCEPHIN) 2 g in sodium chloride 0.9 %  100 mL IVPB        2 g 200 mL/hr over 30 Minutes Intravenous Every 12 hours 02/27/21 1339     02/26/21 2000  ampicillin (OMNIPEN) 2 g in sodium chloride 0.9 % 100 mL IVPB        2 g 300 mL/hr over 20 Minutes Intravenous Every 8 hours 02/26/21 1915                Family Communication/Anticipated D/C date and plan/Code Status   DVT prophylaxis:  apixaban (ELIQUIS) tablet 5 mg     Code Status: Full Code  Family Communication: His wife over the phone Disposition Plan:    Status is: Inpatient  Remains inpatient appropriate because:IV treatments appropriate due to intensity of illness or inability to take PO  Dispo: The patient is from: Home              Anticipated d/c is to: Home              Patient currently is not medically stable to d/c.   Difficult to place patient No           Subjective:   C/o low back pain.  He thinks this pain started in the hospital. No fever since admission.  Objective:    Vitals:   02/27/21 2007 02/27/21 2042 02/27/21 2220 02/28/21 0411  BP: 120/62   107/64  Pulse: 89 78  75  Resp: 18 (!) 26 18 20   Temp: 99 F (37.2 C)   99.3 F (37.4 C)  TempSrc: Oral   Oral  SpO2: 94% 97%  94%  Weight:    86.2 kg  Height:       No data found.   Intake/Output Summary (Last 24 hours) at 02/28/2021 0850 Last data filed at 02/28/2021 0750 Gross per 24 hour  Intake 1058.28 ml  Output 1550 ml  Net -491.72 ml   Filed Weights   02/26/21 0207 02/26/21 1847 02/28/21 0411  Weight: 80.7 kg 85.7 kg 86.2 kg    Exam:   GEN: NAD SKIN: Lump on the left lower back (present for many years). Multiple bruises on the right thigh and right leg. Abrasion on right leg.  EYES: No pallor or icterus ENT: MMM CV: RRR PULM: CTA B ABD: soft, ND, NT, +BS CNS: AAO x 3, non focal EXT: No edema or tenderness MSK: Lumbar spinal tenderness       Data Reviewed:   I have personally reviewed following labs and imaging studies:  Labs: Labs show  the following:   Basic Metabolic Panel: Recent Labs  Lab 02/26/21 0236 02/27/21 0337  NA 127* 132*  K 3.8 4.2  CL 96* 100  CO2 23 24  GLUCOSE 178* 153*  BUN 52* 45*  CREATININE 3.17* 2.52*  CALCIUM 8.8* 9.0   GFR Estimated Creatinine Clearance: 26.2 mL/min (A) (by C-G formula based on SCr of 2.52 mg/dL (H)). Liver Function Tests: Recent Labs  Lab 02/26/21 0236  AST 36  ALT 36  ALKPHOS 94  BILITOT 1.1  PROT 5.2*  ALBUMIN 2.8*   No results for input(s): LIPASE, AMYLASE in the last 168 hours. No results for input(s): AMMONIA in the last 168 hours. Coagulation profile No results for input(s): INR, PROTIME in the last 168 hours.  CBC: Recent Labs  Lab 02/26/21 0236 02/27/21 0337 02/28/21 0817  WBC 9.2 12.8* 10.3  NEUTROABS 7.7  --  7.7  HGB 8.9* 9.0* 9.7*  HCT 25.8* 26.4* 28.1*  MCV 98.5 98.5 96.9  PLT 164 164 196   Cardiac Enzymes: No results for input(s): CKTOTAL, CKMB, CKMBINDEX, TROPONINI in the last 168 hours. BNP (last 3 results) No results for input(s): PROBNP in the last 8760 hours. CBG: Recent Labs  Lab 02/27/21 0722 02/27/21 1103 02/27/21 1625 02/27/21 2049 02/28/21 0754  GLUCAP 145* 117* 172* 141* 128*   D-Dimer: No results for input(s): DDIMER in the last 72 hours. Hgb A1c: No results for input(s): HGBA1C in the last 72 hours. Lipid Profile: No results for input(s): CHOL, HDL, LDLCALC, TRIG, CHOLHDL, LDLDIRECT in the last 72 hours. Thyroid function studies: No results for input(s): TSH, T4TOTAL, T3FREE, THYROIDAB in the last 72 hours.  Invalid input(s): FREET3 Anemia work up: No results for input(s): VITAMINB12, FOLATE, FERRITIN, TIBC, IRON, RETICCTPCT in the last 72 hours. Sepsis Labs: Recent Labs  Lab 02/26/21 0236 02/27/21 0337 02/28/21 0817  WBC 9.2 12.8* 10.3  LATICACIDVEN 0.8  --   --     Microbiology Recent Results (from the past 240 hour(s))  Resp Panel by RT-PCR (Flu A&B, Covid) Nasopharyngeal Swab     Status: None    Collection Time: 02/26/21  2:36 AM   Specimen: Nasopharyngeal Swab; Nasopharyngeal(NP) swabs in vial transport medium  Result Value Ref Range Status   SARS Coronavirus 2 by RT PCR NEGATIVE NEGATIVE Final    Comment: (NOTE) SARS-CoV-2 target nucleic acids are NOT DETECTED.  The SARS-CoV-2 RNA is generally detectable in upper respiratory specimens during the acute phase of infection. The lowest concentration of SARS-CoV-2 viral copies this assay can detect is 138 copies/mL. A negative result does not preclude SARS-Cov-2 infection and should not be used as the sole basis for treatment or other patient management decisions. A negative result may occur with  improper specimen collection/handling, submission of specimen other than nasopharyngeal swab, presence of viral mutation(s) within the areas targeted by this assay, and inadequate number of viral copies(<138 copies/mL). A negative result must be combined with clinical observations, patient history, and epidemiological information. The expected result is Negative.  Fact Sheet for Patients:  EntrepreneurPulse.com.au  Fact Sheet for Healthcare Providers:  IncredibleEmployment.be  This test is no t yet approved or cleared by the [place] FDA and  has been authorized for detection and/or diagnosis of SARS-CoV-2 by FDA under an Emergency Use Authorization (EUA). This EUA will remain  in effect (meaning this test can be used) for the duration of the COVID-19 declaration under Section 564(b)(1) of the Act, 21 U.S.C.section 360bbb-3(b)(1), unless the authorization is terminated  or revoked sooner.       Influenza A by PCR NEGATIVE NEGATIVE Final   Influenza B by PCR NEGATIVE NEGATIVE Final    Comment: (NOTE) The Xpert Xpress SARS-CoV-2/FLU/RSV plus assay is intended as  an aid in the diagnosis of influenza from Nasopharyngeal swab specimens and should not be used as a sole basis for treatment.  Nasal washings and aspirates are unacceptable for Xpert Xpress SARS-CoV-2/FLU/RSV testing.  Fact Sheet for Patients: EntrepreneurPulse.com.au  Fact Sheet for Healthcare Providers: IncredibleEmployment.be  This test is not yet approved or cleared by the [place] FDA and has been authorized for detection and/or diagnosis of SARS-CoV-2 by FDA under an Emergency Use Authorization (EUA). This EUA will remain in effect (meaning this test can be used) for the duration of the COVID-19 declaration under Section 564(b)(1) of the Act, 21 U.S.C. section 360bbb-3(b)(1), unless the authorization is terminated or revoked.  Performed at [facility], [place] [employer] [address] [place], [place] [address]   Blood Culture (routine x 2)     Status: Abnormal (Preliminary result)   Collection Time: 02/26/21  2:36 AM   Specimen: BLOOD  Result Value Ref Range Status   Specimen Description   Final    BLOOD SPECIMEN SOURCE NOT MARKED ON REQUISITION Performed at [facility] [address] [place], [place] [address]    Special Requests   Final    BOTTLES DRAWN AEROBIC AND ANAEROBIC Blood Culture adequate volume Performed at [facility] [address], [place], [place] [address]    Culture  Setup Time   Final    GRAM POSITIVE COCCI IN CHAINS IN BOTH AEROBIC AND ANAEROBIC BOTTLES Organism ID to follow CRITICAL RESULT CALLED TO, READ BACK BY AND VERIFIED WITH: Cristopher Estimable PHARMD 1902 02/26/21 A BROWNING    Culture (A)  Final    ENTEROCOCCUS FAECALIS SUSCEPTIBILITIES TO FOLLOW Performed at [facility], [place] [address], [place], [place] [address]    Report Status PENDING  Incomplete  Urine culture     Status: Abnormal   Collection Time: 02/26/21  2:36 AM   Specimen: In/Out Cath Urine  Result Value Ref Range Status   Specimen Description   Final    IN/OUT CATH URINE Performed at [facility] [address], [place], [place] [address]    Special Requests   Final    NONE Performed at [facility], [place] [address] [place],  [address]    Culture (A)  Final    <10,000 COLONIES/mL INSIGNIFICANT GROWTH Performed at [facility] [address], [place],  [address]    Report Status 02/27/2021 FINAL  Final  Blood Culture ID Panel (Reflexed)     Status: Abnormal   Collection Time: 02/26/21  2:36 AM  Result Value Ref Range Status   Enterococcus faecalis DETECTED (A) NOT DETECTED Final    Comment: CRITICAL RESULT CALLED TO, READ BACK BY AND VERIFIED WITH: E WILLIAMSON PHARMD 1902 02/26/21 A BROWNING    Enterococcus Faecium NOT DETECTED NOT DETECTED Final   Listeria monocytogenes NOT DETECTED NOT DETECTED Final   Staphylococcus species NOT DETECTED NOT DETECTED Final   Staphylococcus aureus (BCID) NOT DETECTED NOT DETECTED Final   Staphylococcus epidermidis NOT DETECTED NOT DETECTED Final   Staphylococcus lugdunensis NOT DETECTED NOT DETECTED Final   Streptococcus species NOT DETECTED NOT DETECTED Final   Streptococcus agalactiae NOT DETECTED NOT DETECTED Final   Streptococcus pneumoniae NOT DETECTED NOT DETECTED Final   Streptococcus pyogenes NOT DETECTED NOT DETECTED Final   A.calcoaceticus-baumannii NOT DETECTED NOT DETECTED Final   Bacteroides fragilis NOT DETECTED NOT DETECTED Final   Enterobacterales NOT DETECTED NOT DETECTED Final   Enterobacter cloacae  complex NOT DETECTED NOT DETECTED Final   Escherichia coli NOT DETECTED NOT DETECTED Final   Klebsiella aerogenes NOT DETECTED NOT DETECTED Final   Klebsiella oxytoca NOT DETECTED NOT DETECTED Final   Klebsiella pneumoniae NOT DETECTED NOT DETECTED Final   Proteus species NOT DETECTED NOT DETECTED Final   Salmonella species NOT DETECTED NOT DETECTED Final   Serratia marcescens NOT DETECTED NOT DETECTED Final   Haemophilus influenzae NOT DETECTED NOT DETECTED Final    Neisseria meningitidis NOT DETECTED NOT DETECTED Final   Pseudomonas aeruginosa NOT DETECTED NOT DETECTED Final   Stenotrophomonas maltophilia NOT DETECTED NOT DETECTED Final   Candida albicans NOT DETECTED NOT DETECTED Final   Candida auris NOT DETECTED NOT DETECTED Final   Candida glabrata NOT DETECTED NOT DETECTED Final   Candida krusei NOT DETECTED NOT DETECTED Final   Candida parapsilosis NOT DETECTED NOT DETECTED Final   Candida tropicalis NOT DETECTED NOT DETECTED Final   Cryptococcus neoformans/gattii NOT DETECTED NOT DETECTED Final   Vancomycin resistance NOT DETECTED NOT DETECTED Final    Comment: Performed at [facility], [place] [address] [place], [place] [address]    Procedures and diagnostic studies:  No results found.             LOS: 2 days   Kae Lauman  [facility]   Pager on [email or web address]. If 7PM-7AM, please contact night-coverage at [email or web address]     02/28/2021, 8:50 AM

## 2021-02-28 NOTE — Progress Notes (Signed)
    [facility] has been requested to perform a transesophageal echocardiogram on 03/04/2021 for bacteremia.  After careful review of history and examination, the risks and benefits of transesophageal echocardiogram have been explained including risks of esophageal damage, perforation (1:10,000 risk), bleeding, pharyngeal hematoma as well as other potential complications associated with conscious sedation including aspiration, arrhythmia, respiratory failure and death. Alternatives to treatment were discussed, questions were answered. Patient is willing to proceed.   75 yo presented with anemia (improving) and bacteremia. Initial Hgb down to 8.9, now improving to >9. Platelet ok. Vital sign ok. TEE scheduled for next Tue.   Almyra Deforest, PA-C 02/28/2021 3:05 PM

## 2021-02-28 NOTE — Plan of Care (Signed)
  Problem: Health Behavior/Discharge Planning: Goal: Ability to manage health-related needs will improve Outcome: Progressing   Problem: Clinical Measurements: Goal: Will remain free from infection Outcome: Progressing Goal: Cardiovascular complication will be avoided Outcome: Progressing   Problem: Safety: Goal: Ability to remain free from injury will improve Outcome: Progressing

## 2021-02-28 NOTE — TOC Initial Note (Signed)
Transition of Care [facility]) - Initial/Assessment Note    Patient Details  Name: [name] MRN: [id number] Date of Birth: 11/13/1945  [facility] CM/SW Contact:    Shade Flood, LCSW Phone Number: 02/28/2021, 2:02 PM  Clinical Narrative:                  Pt admitted from home. PT/OT evaluated pt and are recommending continuation of outpatient therapy pt was receiving prior to admission. Per MD, workup is still in progress but there is potential for pt to require long term IV anbx at dc. If this is the case, TOC will discuss with pt his option for [facility] vs SNF for the treatment. If pt does need the IV therapy and elects for [facility] for this care, PT/OT could also be ordered from [facility] if pt desires.  TOC will follow up once workup complete and dc needs are known.  Expected Discharge Plan: [place] Barriers to Discharge: Continued Medical Work up   Patient Goals and CMS Choice        Expected Discharge Plan and Services Expected Discharge Plan: [place] In-house Referral: Clinical Social Work     Living arrangements for the past 2 months: Single Family Home                                      Prior Living Arrangements/Services Living arrangements for the past 2 months: Single Family Home Lives with:: Spouse Patient language and need for interpreter reviewed:: Yes Do you feel safe going back to the place where you live?: Yes      Need for Family Participation in Patient Care: No (Comment) Care giver support system in place?: Yes (comment)   Criminal Activity/Legal Involvement Pertinent to Current Situation/Hospitalization: No - Comment as needed  Activities of Daily Living Home Assistive Devices/Equipment: CBG Meter, CPAP, Eyeglasses ADL Screening (condition at time of admission) Patient's cognitive ability adequate to safely complete daily activities?: Yes Is the patient deaf or have difficulty hearing?: No Does the  patient have difficulty seeing, even when wearing glasses/contacts?: No Does the patient have difficulty concentrating, remembering, or making decisions?: Yes Patient able to express need for assistance with ADLs?: Yes Does the patient have difficulty dressing or bathing?: No Independently performs ADLs?: Yes (appropriate for developmental age) Does the patient have difficulty walking or climbing stairs?: Yes (secondary to weakness) Weakness of Legs: Both (R>L) Weakness of Arms/Hands: None  Permission Sought/Granted                  Emotional Assessment       Orientation: : Oriented to Self, Oriented to Place, Oriented to  Time, Oriented to Situation Alcohol / Substance Use: Not Applicable Psych Involvement: No (comment)  Admission diagnosis:  Back pain [M54.9] Hyponatremia [E87.1] Contusion, multiple sites [T07.XXXA] Anemia [D64.9] Generalized weakness [R53.1] Febrile illness [R50.9] Frequent falls [R29.6] Symptomatic anemia [D64.9] Patient Active Problem List   Diagnosis Date Noted   Bacteremia due to Gram-positive bacteria 02/27/2021   Anemia 02/26/2021   Hyponatremia 02/26/2021   Weakness generalized 02/26/2021   CKD (chronic kidney disease) stage 4, GFR 15-29 ml/min (HCC) 02/26/2021   Atrial fibrillation, chronic (HCC) 02/26/2021   Chronic anticoagulation 02/26/2021   Fever 02/26/2021   Prolonged QT interval 02/26/2021   Depressive disorder 02/25/2021   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   Squamous cell  carcinoma of skin of face 02/25/2021   Actinic keratosis 02/25/2021   Anxiety state 02/25/2021   Basal cell carcinoma of nose 02/25/2021   Basal cell carcinoma of skin of unspecified ear and external auricular canal 02/25/2021   Benign prostatic hyperplasia with lower urinary tract symptoms 02/25/2021   Bilateral pseudophakia 02/25/2021   Carcinoma in situ of skin of other parts of face 02/25/2021   Dental caries on smooth surface penetrating into dentin  02/25/2021   Deposits (accretions) on teeth 02/25/2021   Diverticulitis of colon 02/25/2021   Encounter for fitting and adjustment of hearing aid 02/25/2021   Encounter for removal of sutures 02/25/2021   Esophageal reflux 02/25/2021   Generalized osteoarthrosis, involving multiple sites 02/25/2021   Gout, unspecified 02/25/2021   History of other malignant neoplasm of skin 02/25/2021   Male erectile dysfunction, unspecified 02/25/2021   Peptic ulcer 02/25/2021   Post-traumatic stress disorder, unspecified 02/25/2021   Psychosexual dysfunction with inhibited sexual excitement 02/25/2021   Sensorineural hearing loss, bilateral 02/25/2021   Unspecified atrial fibrillation ([place]) 02/25/2021   Protein-calorie malnutrition, severe 02/04/2021   Cerebral thrombosis with cerebral infarction 02/02/2021   Right sided weakness 02/01/2021   History of COVID-19 01/29/2021   Memory loss 01/29/2021   Olfactory impairment 01/29/2021   Gait disturbance 01/29/2021   Fatigue 01/29/2021   Physical deconditioning 01/29/2021   Pressure injury of skin 01/07/2021   AKI (acute kidney injury) ([place]) 01/06/2021   Generalized weakness 01/06/2021   ARF (acute renal failure) ([place]) 01/06/2021   Dyspnea 10/05/2020   CHF (congestive heart failure) ([place]) 10/04/2020   Carpal tunnel syndrome of right wrist 09/25/2020   Pain of left hand 09/25/2020   COVID-19 virus infection 05/24/2020   Hypertensive chronic kidney disease with stage 1 through stage 4 chronic kidney disease, or unspecified chronic kidney disease    Coronary artery disease    Type II diabetes mellitus ([place])    Hypertension    Low back pain 04/08/2020   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/05/2019   Pain in right foot 03/22/2018   Cellulitis of left foot 03/01/2018   Pain of joint of left ankle and foot 09/06/2017   Cancer ([place])    Other specified counseling 04/03/2016   Pneumothorax on right 03/29/2016   S/P minimally invasive mitral valve repair  03/25/2016   Atherosclerotic heart disease of native coronary artery without angina pectoris 03/10/2016   Heart failure ([place]) 03/10/2016   Cataract extraction status of eye 03/10/2016   Other long term (current) drug therapy 03/10/2016   Osteoarthritis of knee 03/10/2016   Vitamin D deficiency 03/10/2016   Mitral regurgitation    Avitaminosis D 12/25/2015   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   Arthritis of knee, degenerative 12/25/2015   H/O cataract extraction 12/25/2015   Atherosclerotic heart disease of native coronary artery with other forms of angina pectoris ([place]) 12/25/2015   Congestive heart failure ([place]) 12/25/2015   Pure hypercholesterolemia 12/25/2015   Allergic rhinitis 12/25/2015   Essential (primary) hypertension 12/25/2015   Hypotension 01/29/2014   Expected Blood loss, postoperative 04/29/2012   Pain due to unicompartmental arthroplasty of knee ([place]) 04/27/2012   Preoperative evaluation of a medical condition to rule out surgical contraindications (TAR required) 76/28/3151   Diastolic CHF, chronic ([place]) 06/29/2011   Preoperative evaluation to rule out surgical contraindication 06/29/2011   CAROTID ARTERY STENOSIS 07/09/2010   TIA 05/29/2010   FATIGUE / MALAISE 05/29/2010   CAD, AUTOLOGOUS BYPASS GRAFT 11/15/2008   MYOCARDIAL INFARCTION 12/18/2007   ALLERGIC RHINITIS  WITH CONJUNCTIVITIS 12/18/2007   Type 2 diabetes mellitus without complications ([place]) 83/47/5830   Other and unspecified hyperlipidemia 12/05/2007   Other ill-defined and unknown causes of morbidity and mortality 12/05/2007   Essential hypertension 12/05/2007   Coronary atherosclerosis 12/05/2007   DEGENERATIVE JOINT DISEASE 12/05/2007   Obstructive sleep apnea of adult 12/05/2007   PCP:  [name], MD Pharmacy:   [facility] [place] - [address] [place] ([place]) [place] [address] Phone: [phone] Fax: [phone]     Social  Determinants of Health (SDOH) Interventions    Readmission Risk Interventions No flowsheet data found.

## 2021-02-28 NOTE — Progress Notes (Addendum)
[name] for Infectious Disease    Date of Admission:  02/26/2021   Total days of antibiotics 3           ID: [name] is a 75 y.o. 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 Recent stroke Principal Problem:   Anemia Active Problems:   Type 2 diabetes mellitus without complications (HCC)   Essential hypertension   CAD, AUTOLOGOUS BYPASS GRAFT   Diastolic CHF, chronic (HCC)   Hyponatremia   Weakness generalized   CKD (chronic kidney disease) stage 4, GFR 15-29 ml/min (HCC)   Atrial fibrillation, chronic (HCC)   Chronic anticoagulation   Fever   Prolonged QT interval   Bacteremia due to Gram-positive bacteria    Subjective: Feeling poorly, difficulty with left knee and quadricep since having a fall. No fevers or chills but feels that he had to advocate for himself since he called his cardiologist. He doesn't recall if he had loss of consciousness with recent falls at home. He has low back pain when he tries to lift left leg  Medications:   amiodarone  200 mg Oral Daily   apixaban  5 mg Oral BID   calcitRIOL  0.25 mcg Oral QODAY   chlorhexidine  15 mL Mouth Rinse BID   insulin aspart  0-5 Units Subcutaneous QHS   insulin aspart  0-9 Units Subcutaneous TID WC   loratadine  10 mg Oral Daily   mouth rinse  15 mL Mouth Rinse q12n4p   traZODone  100 mg Oral QHS    Objective: Vital signs in last 24 hours: Temp:  [97.9 F (36.6 C)-99.3 F (37.4 C)] 97.9 F (36.6 C) (07/08 1331) Pulse Rate:  [72-89] 72 (07/08 1331) Resp:  [18-26] 20 (07/08 0411) BP: (107-120)/(62-66) 118/66 (07/08 1331) SpO2:  [94 %-97 %] 94 % (07/08 1331) Weight:  [86.2 kg] 86.2 kg (07/08 0411) Physical Exam  Constitutional: He is oriented to person, place, and time. He appears well-developed and well-nourished. No distress.  HENT:  Mouth/Throat: Oropharynx is clear and moist. No oropharyngeal exudate.  Cardiovascular: Normal rate, regular rhythm and normal heart sounds. Exam  reveals no gallop and no friction rub.  No murmur heard.  Pulmonary/Chest: Effort normal and breath sounds normal. No respiratory distress. He has no wheezes.  Abdominal: Soft. Bowel sounds are normal. He exhibits no distension. There is no tenderness.  Ext: left knee effusion but not necessarily warm to touch. Painful to touch to left knee and posterior thigh. Right leg diffuse bruising but no effusion to right knee Neurological: He is alert and oriented to person, place, and time.  Skin: Skin is warm and dry. No rash noted. No erythema.  Psychiatric: He has a normal mood and affect. His behavior is normal.    Lab Results Recent Labs    02/27/21 0337 02/28/21 0817  WBC 12.8* 10.3  HGB 9.0* 9.7*  HCT 26.4* 28.1*  NA 132* 130*  K 4.2 4.2  CL 100 98  CO2 24 24  BUN 45* 45*  CREATININE 2.52* 2.73*   Liver Panel Recent Labs    02/26/21 0236  PROT 5.2*  ALBUMIN 2.8*  AST 36  ALT 36  ALKPHOS 94  BILITOT 1.1   No results found for: [place]  Microbiology: 7/6 blood cx enterococcus faecalis Studies/Results: No results found.   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 Needs TEE, planned for Tuesday tentatively. Will repeat blood  cx to see that he is clearing bacteremia  Recent fall = not a great historian but concern that the cause of his fall maybe due to embolic stroke (recurrence). Will get repeat mri brain  Left leg pain = on exam appears to have effusion, has remote history of gout. Maybe worth getting aspirate of knee to send for cell count, crystals, and aerobic culture  Back pain = getting lumbar spine mri to ensure no evidence of discitis   [facility] for Infect[facility] Cell: [phone] Pager: [phone]  02/28/2021, 5:40 PM

## 2021-03-01 DIAGNOSIS — M25462 Effusion, left knee: Secondary | ICD-10-CM | POA: Diagnosis present

## 2021-03-01 LAB — SYNOVIAL CELL COUNT + DIFF, W/ CRYSTALS
Crystals, Fluid: NONE SEEN
Eosinophils-Synovial: 0 % (ref 0–1)
Lymphocytes-Synovial Fld: 1 % (ref 0–20)
Monocyte-Macrophage-Synovial Fluid: 0 % — ABNORMAL LOW (ref 50–90)
Neutrophil, Synovial: 99 % — ABNORMAL HIGH (ref 0–25)
Other Cells-SYN: NONE SEEN
WBC, Synovial: 12350 /mm3 — ABNORMAL HIGH (ref 0–200)

## 2021-03-01 LAB — BASIC METABOLIC PANEL
Anion gap: 10 (ref 5–15)
BUN: 45 mg/dL — ABNORMAL HIGH (ref 8–23)
CO2: 24 mmol/L (ref 22–32)
Calcium: 9.1 mg/dL (ref 8.9–10.3)
Chloride: 97 mmol/L — ABNORMAL LOW (ref 98–111)
Creatinine, Ser: 2.68 mg/dL — ABNORMAL HIGH (ref 0.61–1.24)
GFR, Estimated: 24 mL/min — ABNORMAL LOW (ref >60)
Glucose, Bld: 114 mg/dL — ABNORMAL HIGH (ref 70–99)
Potassium: 3.7 mmol/L (ref 3.5–5.1)
Sodium: 131 mmol/L — ABNORMAL LOW (ref 135–145)

## 2021-03-01 LAB — CBC WITH DIFFERENTIAL/PLATELET
Abs Immature Granulocytes: 0.15 10*3/uL — ABNORMAL HIGH (ref 0.00–0.07)
Basophils Absolute: 0.1 10*3/uL (ref 0.0–0.1)
Basophils Relative: 1 %
Eosinophils Absolute: 0.3 10*3/uL (ref 0.0–0.5)
Eosinophils Relative: 4 %
HCT: 25.8 % — ABNORMAL LOW (ref 39.0–52.0)
Hemoglobin: 8.8 g/dL — ABNORMAL LOW (ref 13.0–17.0)
Immature Granulocytes: 2 %
Lymphocytes Relative: 9 %
Lymphs Abs: 0.8 10*3/uL (ref 0.7–4.0)
MCH: 33.5 pg (ref 26.0–34.0)
MCHC: 34.1 g/dL (ref 30.0–36.0)
MCV: 98.1 fL (ref 80.0–100.0)
Monocytes Absolute: 0.8 10*3/uL (ref 0.1–1.0)
Monocytes Relative: 10 %
Neutro Abs: 6.6 10*3/uL (ref 1.7–7.7)
Neutrophils Relative %: 74 %
Platelets: 215 10*3/uL (ref 150–400)
RBC: 2.63 MIL/uL — ABNORMAL LOW (ref 4.22–5.81)
RDW: 13.6 % (ref 11.5–15.5)
WBC: 8.8 10*3/uL (ref 4.0–10.5)
nRBC: 0 % (ref 0.0–0.2)

## 2021-03-01 LAB — GLUCOSE, CAPILLARY
Glucose-Capillary: 109 mg/dL — ABNORMAL HIGH (ref 70–99)
Glucose-Capillary: 155 mg/dL — ABNORMAL HIGH (ref 70–99)
Glucose-Capillary: 147 mg/dL — ABNORMAL HIGH (ref 70–99)
Glucose-Capillary: 146 mg/dL — ABNORMAL HIGH (ref 70–99)

## 2021-03-01 MED ORDER — LIDOCAINE 5 % EX PTCH
1.0000 | MEDICATED_PATCH | CUTANEOUS | Status: DC
  Administered 2021-03-01 – 2021-03-06 (×6): 1 via TRANSDERMAL
  Filled 2021-03-01 (×7): qty 1

## 2021-03-01 NOTE — Plan of Care (Signed)
  Problem: Safety: Goal: Ability to remain free from injury will improve Outcome: Progressing   Problem: Skin Integrity: Goal: Risk for impaired skin integrity will decrease Outcome: Progressing   

## 2021-03-01 NOTE — Progress Notes (Addendum)
PROGRESS NOTE  [name] ZOX:[id number] DOB: 05/11/1946 DOA: 02/26/2021 PCP: [name], MD  HPI/Recap of past 24 hours: Patient seen and examined at bedside patient seems to be in pain on his left knee Also complaining of back pain  Assessment/Plan: Principal Problem:   Anemia Active Problems:   Type 2 diabetes mellitus without complications (HCC)   Essential hypertension   CAD, AUTOLOGOUS BYPASS GRAFT   Diastolic CHF, chronic (HCC)   Hyponatremia   Weakness generalized   CKD (chronic kidney disease) stage 4, GFR 15-29 ml/min (HCC)   Atrial fibrillation, chronic (HCC)   Chronic anticoagulation   Fever   Prolonged QT interval   Bacteremia due to Gram-positive bacteria  1. enterococcal bacteremia patient was to have TEE done but was held due to his hemoglobin that is low they want him to have hemoglobin of above 9.  We will continue to monitor Continue antibiotics patient is afebrile at this time  2.  Left knee pain with effusion.  X-ray reveals large effusion of the left knee. I will consult orthopedic Will apply Ace wrap Will apply lidocaine patch for pain control Continue on the oral pain medication Appreciate orthopedic consult with aspiration of the left knee  3.  Coronary artery disease with history of stroke.  Patient has been on Eliquis and aspirin and 4.  Chronic atrial fibrillation on Eliquis and amiodarone  5.  Chronic kidney disease stage IV.  Creatinine is stable  6.  Chronic diastolic CHF well compensated no issues at this time  Code Status: Patient seen and examined at bedside complaining of severe left knee pain with swelling  Severity of Illness: The appropriate patient status for this patient is INPATIENT. Inpatient status is judged to be reasonable and necessary in order to provide the required intensity of service to ensure the patient's safety. The patient's presenting symptoms, physical exam findings, and initial radiographic and  laboratory data in the context of their chronic comorbidities is felt to place them at high risk for further clinical deterioration. Furthermore, it is not anticipated that the patient will be medically stable for discharge from the hospital within 2 midnights of admission. The following factors support the patient status of inpatient.   " Patient still requires IV antibiotics has knee effusion probably need to have drainage by orthopedic  * I certify that at the point of admission it is my clinical judgment that the patient will require inpatient hospital care spanning beyond 2 midnights from the point of admission due to high intensity of service, high risk for further deterioration and high frequency of surveillance required.*   Family Communication: Wife [name] at bedside  Disposition Plan: remains inpatient    Consultants: Orthopedic Infectious disease Cardiology  Procedures: None    Antimicrobials: Ceftriaxone IV ampicillin  DVT prophylaxis: Eliquis   Objective: Vitals:   02/28/21 2036 02/28/21 2330 03/01/21 0156 03/01/21 0544  BP: 105/68   108/63  Pulse: 72 69  (!) 58  Resp: 18 (!) 22  18  Temp: 98.3 F (36.8 C)   97.7 F (36.5 C)  TempSrc:      SpO2: 98%   100%  Weight:   87.6 kg   Height:        Intake/Output Summary (Last 24 hours) at 03/01/2021 0935 Last data filed at 03/01/2021 0600 Gross per 24 hour  Intake 355.68 ml  Output 1100 ml  Net -744.32 ml   Filed Weights   02/26/21 1847 02/28/21 0411 03/01/21 0156  Weight: 85.7 kg 86.2 kg 87.6 kg   Body mass index is 27.71 kg/m.  Exam:  General: 75 y.o. year-old male well developed well nourished in no acute distress.  Alert and oriented x3. Cardiovascular: Regular rate and rhythm with no rubs or gallops.  No thyromegaly or JVD noted.   Respiratory: Clear to auscultation with no wheezes or rales. Good inspiratory effort. Abdomen: Soft nontender nondistended with normal bowel sounds x4  quadrants. Musculoskeletal: No lower extremity edema. 2/4 pulses in all 4 extremities. Left knee has diffuse swelling with fluctuance and tenderness.  Not hot or red reduced range of motion Skin: No ulcerative lesions noted or rashes, Psychiatry: Mood is appropriate for condition and setting    Data Reviewed: CBC: Recent Labs  Lab 02/26/21 0236 02/27/21 0337 02/28/21 0817 03/01/21 0257  WBC 9.2 12.8* 10.3 8.8  NEUTROABS 7.7  --  7.7 6.6  HGB 8.9* 9.0* 9.7* 8.8*  HCT 25.8* 26.4* 28.1* 25.8*  MCV 98.5 98.5 96.9 98.1  PLT 164 164 196 124   Basic Metabolic Panel: Recent Labs  Lab 02/26/21 0236 02/27/21 0337 02/28/21 0817 03/01/21 0257  NA 127* 132* 130* 131*  K 3.8 4.2 4.2 3.7  CL 96* 100 98 97*  CO2 23 24 24 24   GLUCOSE 178* 153* 121* 114*  BUN 52* 45* 45* 45*  CREATININE 3.17* 2.52* 2.73* 2.68*  CALCIUM 8.8* 9.0 9.4 9.1   GFR: Estimated Creatinine Clearance: 26.5 mL/min (A) (by C-G formula based on SCr of 2.68 mg/dL (H)). Liver Function Tests: Recent Labs  Lab 02/26/21 0236  AST 36  ALT 36  ALKPHOS 94  BILITOT 1.1  PROT 5.2*  ALBUMIN 2.8*   No results for input(s): LIPASE, AMYLASE in the last 168 hours. No results for input(s): AMMONIA in the last 168 hours. Coagulation Profile: No results for input(s): INR, PROTIME in the last 168 hours. Cardiac Enzymes: No results for input(s): CKTOTAL, CKMB, CKMBINDEX, TROPONINI in the last 168 hours. BNP (last 3 results) No results for input(s): PROBNP in the last 8760 hours. HbA1C: No results for input(s): HGBA1C in the last 72 hours. CBG: Recent Labs  Lab 02/28/21 0754 02/28/21 1056 02/28/21 1713 02/28/21 2040 03/01/21 0735  GLUCAP 128* 116* 163* 142* 109*   Lipid Profile: No results for input(s): CHOL, HDL, LDLCALC, TRIG, CHOLHDL, LDLDIRECT in the last 72 hours. Thyroid Function Tests: No results for input(s): TSH, T4TOTAL, FREET4, T3FREE, THYROIDAB in the last 72 hours. Anemia Panel: No results for  input(s): VITAMINB12, FOLATE, FERRITIN, TIBC, IRON, RETICCTPCT in the last 72 hours. Urine analysis:    Component Value Date/Time   COLORURINE YELLOW 02/26/2021 0236   APPEARANCEUR CLEAR 02/26/2021 0236   LABSPEC 1.010 02/26/2021 0236   PHURINE 5.0 02/26/2021 0236   GLUCOSEU NEGATIVE 02/26/2021 0236   HGBUR MODERATE (A) 02/26/2021 0236   BILIRUBINUR NEGATIVE 02/26/2021 0236   KETONESUR NEGATIVE 02/26/2021 0236   PROTEINUR NEGATIVE 02/26/2021 0236   UROBILINOGEN 0.2 04/04/2015 1529   NITRITE NEGATIVE 02/26/2021 0236   LEUKOCYTESUR NEGATIVE 02/26/2021 0236   Sepsis Labs: @LABRCNTIP (procalcitonin:4,lacticidven:4)  ) Recent Results (from the past 240 hour(s))  Resp Panel by RT-PCR (Flu A&B, Covid) Nasopharyngeal Swab     Status: None   Collection Time: 02/26/21  2:36 AM   Specimen: Nasopharyngeal Swab; Nasopharyngeal(NP) swabs in vial transport medium  Result Value Ref Range Status   SARS Coronavirus 2 by RT PCR NEGATIVE NEGATIVE Final    Comment: (NOTE) SARS-CoV-2 target nucleic acids are NOT DETECTED.  The SARS-CoV-2 RNA is generally detectable in upper respiratory specimens during the acute phase of infection. The lowest concentration of SARS-CoV-2 viral copies this assay can detect is 138 copies/mL. A negative result does not preclude SARS-Cov-2 infection and should not be used as the sole basis for treatment or other patient management decisions. A negative result may occur with  improper specimen collection/handling, submission of specimen other than nasopharyngeal swab, presence of viral mutation(s) within the areas targeted by this assay, and inadequate number of viral copies(<138 copies/mL). A negative result must be combined with clinical observations, patient history, and epidemiological information. The expected result is Negative.  Fact Sheet for Patients:  EntrepreneurPulse.com.au  Fact Sheet for Healthcare Providers:   IncredibleEmployment.be  This test is no t yet approved or cleared by the [place] FDA and  has been authorized for detection and/or diagnosis of SARS-CoV-2 by FDA under an Emergency Use Authorization (EUA). This EUA will remain  in effect (meaning this test can be used) for the duration of the COVID-19 declaration under Section 564(b)(1) of the Act, 21 U.S.C.section 360bbb-3(b)(1), unless the authorization is terminated  or revoked sooner.       Influenza A by PCR NEGATIVE NEGATIVE Final   Influenza B by PCR NEGATIVE NEGATIVE Final    Comment: (NOTE) The Xpert Xpress SARS-CoV-2/FLU/RSV plus assay is intended as an aid in the diagnosis of influenza from Nasopharyngeal swab specimens and should not be used as a sole basis for treatment. Nasal washings and aspirates are unacceptable for Xpert Xpress SARS-CoV-2/FLU/RSV testing.  Fact Sheet for Patients: EntrepreneurPulse.com.au  Fact Sheet for Healthcare Providers: IncredibleEmployment.be  This test is not yet approved or cleared by the [place] FDA and has been authorized for detection and/or diagnosis of SARS-CoV-2 by FDA under an Emergency Use Authorization (EUA). This EUA will remain in effect (meaning this test can be used) for the duration of the COVID-19 declaration under Section 564(b)(1) of the Act, 21 U.S.C. section 360bbb-3(b)(1), unless the authorization is terminated or revoked.  Performed at [facility], [place] [address], [place], [place] [address]   Blood Culture (routine x 2)     Status: Abnormal   Collection Time: 02/26/21  2:36 AM   Specimen: BLOOD  Result Value Ref Range Status   Specimen Description   Final    BLOOD SPECIMEN SOURCE NOT MARKED ON REQUISITION Performed at [facility] [address], [place], [place] [address]    Special Requests   Final    BOTTLES DRAWN AEROBIC AND ANAEROBIC Blood  Culture adequate volume Performed at [facility] [address] [place], [place] [address]    Culture  Setup Time   Final    GRAM POSITIVE COCCI IN CHAINS IN BOTH AEROBIC AND ANAEROBIC BOTTLES Organism ID to follow CRITICAL RESULT CALLED TO, READ BACK BY AND VERIFIED WITHCristopher Estimable PHARMD 1902 02/26/21 A BROWNING Performed at [facility], [place] [address] [place], [place] [address]    Culture ENTEROCOCCUS FAECALIS (A)  Final   Report Status 02/28/2021 FINAL  Final   Organism ID, Bacteria ENTEROCOCCUS FAECALIS  Final      Susceptibility   Enterococcus faecalis - MIC*    AMPICILLIN <=2 SENSITIVE Sensitive     VANCOMYCIN 1 SENSITIVE Sensitive     GENTAMICIN SYNERGY SENSITIVE Sensitive     * ENTEROCOCCUS FAECALIS  Urine culture     Status: Abnormal   Collection Time: 02/26/21  2:36 AM  Specimen: In/Out Cath Urine  Result Value Ref Range Status   Specimen Description   Final    IN/OUT CATH URINE Performed at [facility] [address], [place], [place] [address]    Special Requests   Final    NONE Performed at [name], [place] [address] [place], [place] [address]    Culture (A)  Final    <10,000 COLONIES/mL INSIGNIFICANT GROWTH Performed at [facility] [address] [place], [place] [address]    Report Status 02/27/2021 FINAL  Final  Blood Culture ID Panel (Reflexed)     Status: Abnormal   Collection Time: 02/26/21  2:36 AM  Result Value Ref Range Status   Enterococcus faecalis DETECTED (A) NOT DETECTED Final    Comment: CRITICAL RESULT CALLED TO, READ BACK BY AND VERIFIED WITH: E WILLIAMSON PHARMD 1902 02/26/21 A BROWNING    Enterococcus Faecium NOT DETECTED NOT DETECTED Final   Listeria monocytogenes NOT DETECTED NOT DETECTED Final   Staphylococcus species NOT DETECTED NOT DETECTED Final   Staphylococcus aureus (BCID) NOT DETECTED NOT DETECTED Final   Staphylococcus epidermidis NOT DETECTED  NOT DETECTED Final   Staphylococcus lugdunensis NOT DETECTED NOT DETECTED Final   Streptococcus species NOT DETECTED NOT DETECTED Final   Streptococcus agalactiae NOT DETECTED NOT DETECTED Final   Streptococcus pneumoniae NOT DETECTED NOT DETECTED Final   Streptococcus pyogenes NOT DETECTED NOT DETECTED Final   A.calcoaceticus-baumannii NOT DETECTED NOT DETECTED Final   Bacteroides fragilis NOT DETECTED NOT DETECTED Final   Enterobacterales NOT DETECTED NOT DETECTED Final   Enterobacter cloacae complex NOT DETECTED NOT DETECTED Final   Escherichia coli NOT DETECTED NOT DETECTED Final   Klebsiella aerogenes NOT DETECTED NOT DETECTED Final   Klebsiella oxytoca NOT DETECTED NOT DETECTED Final   Klebsiella pneumoniae NOT DETECTED NOT DETECTED Final   Proteus species NOT DETECTED NOT DETECTED Final   Salmonella species NOT DETECTED NOT DETECTED Final   Serratia marcescens NOT DETECTED NOT DETECTED Final   Haemophilus influenzae NOT DETECTED NOT DETECTED Final   Neisseria meningitidis NOT DETECTED NOT DETECTED Final   Pseudomonas aeruginosa NOT DETECTED NOT DETECTED Final   Stenotrophomonas maltophilia NOT DETECTED NOT DETECTED Final   Candida albicans NOT DETECTED NOT DETECTED Final   Candida auris NOT DETECTED NOT DETECTED Final   Candida glabrata NOT DETECTED NOT DETECTED Final   Candida krusei NOT DETECTED NOT DETECTED Final   Candida parapsilosis NOT DETECTED NOT DETECTED Final   Candida tropicalis NOT DETECTED NOT DETECTED Final   Cryptococcus neoformans/gattii NOT DETECTED NOT DETECTED Final   Vancomycin resistance NOT DETECTED NOT DETECTED Final    Comment: Performed at [facility], [address] [place], [place] [address]  Culture, blood (Routine X 2) w Reflex to ID Panel     Status: None (Preliminary result)   Collection Time: 02/28/21  4:11 PM   Specimen: BLOOD  Result Value Ref Range Status   Specimen Description   Final    BLOOD RIGHT ANTECUBITAL Performed at  [facility] [address], [place], [place] [address]    Special Requests   Final    BOTTLES DRAWN AEROBIC AND ANAEROBIC Blood Culture adequate volume Performed at [facility] [address] [place], [place] [address]    Culture   Final    NO GROWTH < 12 HOURS Performed at [facility] [address] [place], [place] [address]    Report Status PENDING  Incomplete  Culture, blood (Routine X 2) w Reflex to ID Panel     Status: None (Preliminary result)   Collection Time: 02/28/21  4:22 PM   Specimen: BLOOD RIGHT HAND  Result Value Ref Range Status   Specimen Description   Final    BLOOD RIGHT HAND Performed at [facility] [address], [place], [place] [address]    Special Requests   Final    BOTTLES DRAWN AEROBIC AND ANAEROBIC Blood Culture adequate volume Performed at [facility] [address] [place], [place] [address]    Culture   Final    NO GROWTH < 12 HOURS Performed at [facility] [address], [place], [place] [address]    Report Status PENDING  Incomplete      Studies: DG Knee 1-2 Views Left  Result Date: 02/28/2021 CLINICAL DATA:  Anterior knee pain EXAM: LEFT KNEE - 1-2 VIEW COMPARISON:  None. FINDINGS: No fracture or dislocation of the left knee. Minimal medial compartment joint space loss. Large, nonspecific knee joint effusion. Soft tissue edema about the anterior knee. Vascular calcinosis. IMPRESSION: 1.  No fracture or dislocation of the left knee. 2.  Large, nonspecific knee joint effusion 3.  Soft tissue edema about the anterior knee. Electronically Signed   By: Eddie Candle M.D.   On: 02/28/2021 19:15   MR BRAIN WO CONTRAST  Result Date: 02/28/2021 CLINICAL DATA:  Initial evaluation for possible stroke. EXAM: MRI HEAD WITHOUT CONTRAST TECHNIQUE: Multiplanar, multiecho pulse sequences of the brain and surrounding structures were obtained without intravenous  contrast. COMPARISON:  Recent MRI from 02/01/2021. FINDINGS: Brain: Generalized age-related cerebral atrophy. Again seen is patchy and confluent T2/FLAIR hyperintensity involving the periventricular, deep, and subcortical white matter both cerebral hemispheres, most like related chronic microvascular ischemic disease, moderately advanced in nature. Few scattered superimposed remote lacunar infarcts about the hemispheric cerebral white matter are seen, stable. Small remote left thalamic lacunar infarct noted as well, stable. Small remote cortical infarcts involving the left frontal and right parieto-occipital region again noted, also stable. Tiny remote right cerebellar infarct noted There has been interval evolution of recently seen left cerebral white matter infarct, now demonstrating essentially resolved diffusion signal abnormality. There is a new 6 mm focus of diffusion abnormality involving the posterior left lentiform nucleus (series 5, image 71), new from prior. Associated signal loss on corresponding ADC map (series 6, image 23). Finding consistent with a small acute ischemic infarct. No other diffusion abnormality to suggest acute or subacute ischemia elsewhere within the brain. Gray-white matter differentiation otherwise maintained. No acute intracranial hemorrhage. Few scattered chronic micro hemorrhages again noted involving the right greater than left cerebral hemispheres as well as the cerebellum, stable. No mass lesion, midline shift or mass effect. No hydrocephalus or extra-axial fluid collection. Pituitary gland and suprasellar region within normal limits. Midline structures intact. Vascular: Major intracranial vascular flow voids are maintained. Skull and upper cervical spine: Craniocervical junction within normal limits. Bone marrow signal intensity within normal limits. No scalp soft tissue abnormality. Sinuses/Orbits: Patient status post bilateral ocular lens replacement. Globes and orbital soft  tissues demonstrate no acute finding. Scattered mucosal thickening noted within the ethmoidal air cells. Paranasal sinuses are otherwise largely clear. Trace left mastoid effusion, of doubtful significance. Inner ear structures grossly normal. Other: None. IMPRESSION: 1. New 6 mm acute ischemic nonhemorrhagic infarct involving the posterior left lentiform nucleus. 2. Normal expected interval evolution of recently identified small left cerebral white matter  infarct. 3. Underlying age-related cerebral atrophy with moderate chronic microvascular ischemic disease and multiple remote ischemic infarcts as above, stable. Electronically Signed   By: Jeannine Boga M.D.   On: 02/28/2021 23:43   MR LUMBAR SPINE WO CONTRAST  Result Date: 02/28/2021 CLINICAL DATA:  Initial evaluation for low back pain, bacteremia, infection suspected. EXAM: MRI LUMBAR SPINE WITHOUT CONTRAST TECHNIQUE: Multiplanar, multisequence MR imaging of the lumbar spine was performed. No intravenous contrast was administered. COMPARISON:  Prior radiograph from 02/26/2021. FINDINGS: Segmentation: Standard. Lowest well-formed disc space labeled the L5-S1 level. Alignment: Trace retrolisthesis of L3 on L4 and L4 on L5, with 3 mm anterolisthesis of L5 on S1. Underlying dextroscoliosis. Vertebrae: Mild chronic wedging deformity noted at the superior endplate of L2. Vertebral body height otherwise maintained without acute or subacute fracture. Bone marrow signal intensity within normal limits. No worrisome osseous lesions. Discogenic reactive endplate change present about the L4-5 interspace. No other abnormal marrow edema. No findings to suggest osteomyelitis discitis or septic arthritis. Conus medullaris and cauda equina: Conus extends to the L1 level. Conus and cauda equina appear normal. Paraspinal and other soft tissues: Paraspinous soft tissues demonstrate no acute finding. Visualized kidneys are somewhat atrophic. A few small subcentimeter simple  cysts noted within the visualized right kidney. Circumaortic left renal vein noted. Disc levels: T12-L1: Mild disc bulge with disc desiccation and reactive endplate spurring. No spinal stenosis. Foramina remain patent. L1-2: Mild disc bulge with disc desiccation. Prominent endplate osteophytic spurring on the right. No spinal stenosis. Foramina remain patent. L2-3: Mild disc bulge with disc desiccation. Associated reactive endplate spurring. Mild bilateral facet hypertrophy. No significant spinal stenosis. Foramina remain patent. L3-4: Mild disc bulge with disc desiccation. Mild bilateral facet hypertrophy. No significant spinal stenosis. Foramina remain patent. L4-5: Degenerative intervertebral disc space narrowing with disc desiccation and mild diffuse disc bulge. Associated reactive endplate change with marginal endplate osteophytic spurring, greater on the left. Moderate facet hypertrophy. 0.4 x 1.2 cm cystic lesion at the anteromedial aspect of the left L4-5 facet most likely reflects a synovial cyst (series 8, image 30). Additional synovial cyst at the inferior aspect of the left L4-5 facet measures 1.6 cm (series 7, image 13). Changes resultant severe canal with severe right worse than left lateral recess stenosis. Moderate left worse than right L4 foraminal narrowing. L5-S1: Anterolisthesis. Disc desiccation with mild disc bulge. Superimposed left foraminal disc protrusion contacts the exiting left L5 nerve root (series 8, image 36). Moderate right worse than left facet hypertrophy. No significant spinal stenosis. Mild bilateral L5 foraminal narrowing. IMPRESSION: 1. No MRI evidence for acute infection within the lumbar spine. 2. Multifactorial degenerative changes at L4-5 with resultant severe canal with right worse than left lateral recess stenosis, with moderate left worse than right L4 foraminal narrowing. 3. Small left foraminal disc protrusion at L5-S1, contacting and potentially irritating the exiting  left L5 nerve root. 4. Additional mild noncompressive disc bulging and facet hypertrophy at L1-2 through L3-4 without significant stenosis or neural impingement. Electronically Signed   By: Jeannine Boga M.D.   On: 02/28/2021 23:55    Scheduled Meds:  amiodarone  200 mg Oral Daily   apixaban  5 mg Oral BID   calcitRIOL  0.25 mcg Oral QODAY   chlorhexidine  15 mL Mouth Rinse BID   insulin aspart  0-5 Units Subcutaneous QHS   insulin aspart  0-9 Units Subcutaneous TID WC   loratadine  10 mg Oral Daily   mouth rinse  15  mL Mouth Rinse q12n4p   traZODone  100 mg Oral QHS    Continuous Infusions:  sodium chloride Stopped (02/27/21 1600)   ampicillin (OMNIPEN) IV 2 g (03/01/21 0304)   cefTRIAXone (ROCEPHIN)  IV 2 g (03/01/21 0840)     LOS: 3 days     Cristal Deer, MD [facility]  To reach me or the doctor on call, go to: [email or web address] Password [email or web address]  03/01/2021, 9:35 AM

## 2021-03-01 NOTE — Progress Notes (Signed)
Placed patient on CPAP at [facility] for the night.

## 2021-03-01 NOTE — Progress Notes (Signed)
Assumed care of patient from [facility].  Agree with the shift assessment findings.

## 2021-03-01 NOTE — Consult Note (Signed)
Reason for Consult:  left knee pain Referring Physician: Dr. Blima Rich is an 75 y.o. male.  HPI: Patient is a 75 year old male with past medical history significant for stroke.  He is admitted for bacteremia.  He has had 2 recent falls.  He complains of left knee pain and reports that he fell on that knee in the last week or 2.  He complains of pain and swelling in the knee.  The pain is more severe with any attempted bending or straightening the knee.  He feels better when he is off the foot and keeping the knee still.  He has a history of gout in his feet but has never had a knee.  He is on IV Rocephin for bacteremia.  Past Medical History:  Diagnosis Date   Allergic rhinitis    Anxiety    Atrial fibrillation (HCC)    Cancer (HCC)    hx of skin cancers    Chronic kidney disease    Coronary artery disease    a. s/p MI & CABG; b. s/p PCI Diag;  c. Cath 12/2011: LAD diff dzs/small, Diag 75% isr, 90 dist to stent (small), LCX & RCA occluded, VG->OM 40, VG->PDA patent - med rx.   Depression    PTSD   Diabetes mellitus    not on medications   Diastolic CHF, chronic ([place])    a. EF 55-60% by echo 2007   GERD (gastroesophageal reflux disease)    "not anymore" " I had a lap band"   History of diverticulitis of colon    5 YRS AGO   History of pneumonia    2017   History of transient ischemic attack (TIA)    CAROTID DOPPLERS NOV 2011  0-39& BIL. 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   Complex valvuloplasty including artificial Gore-tex neochord placement x4, plication of anterior commissure, and 26 mm Sorin Memo 3D ring annuloplasty via right  mini thoracotomy approach   Shortness of breath dyspnea    with exertion   Skin cancer    Sleep apnea    cpap- see ov note in EPIC 05/12/11 for settings    Stroke [facility])    hx of   Stroke [facility])     Past Surgical History:  Procedure Laterality Date   AV FISTULA PLACEMENT Left 08/02/2018   Procedure: ARTERIOVENOUS (AV) FISTULA CREATION RADIOCEPHALIC;  Surgeon: Angelia Mould, MD;  Location: [place];  Service: Vascular;  Laterality: Left;   BLEPHAROPLASTY  [place]  2001, 2004, 2009, 2011   CARDIAC CATHETERIZATION N/A 01/23/2016   Procedure: Right/Left Heart Cath and Coronary Angiography;  Surgeon: [name], MD;  Location: [place] CV LAB;  Service: Cardiovascular;  Laterality: N/A;   CARDIOVERSION N/A 09/07/2017   Procedure: CARDIOVERSION;  Surgeon: [name], MD;  Location: [facility] ENDOSCOPY;  Service: Cardiovascular;  Laterality: N/A;   CARDIOVERSION N/A 09/13/2019   Procedure: CARDIOVERSION;  Surgeon: [name], MD;  Location: [facility] ENDOSCOPY;  Service: Cardiovascular;  Laterality: N/A;   CARDIOVERSION N/A 10/19/2019  Procedure: CARDIOVERSION;  Surgeon: [name], MD;  Location: [facility] ENDOSCOPY;  Service: Cardiovascular;  Laterality: N/A;   CATARACT EXTRACTION W/ INTRAOCULAR LENS IMPLANT Bilateral    CHONDROPLASTY  07/22/2011   Procedure: CHONDROPLASTY;  Surgeon: Dione Plover Aluisio;  Location: [place];  Service: Orthopedics;;   CIRCUMCISION  [place]-- POST CABG   W/ STENT, last cath 01/07/2012    CORONARY ARTERY BYPASS GRAFT  1995   X3 VESSEL   CORONARY ARTERY BYPASS GRAFT  1995   CORONARY STENT PLACEMENT     CYSTOSCOPY/URETEROSCOPY/HOLMIUM LASER/STENT PLACEMENT Right 01/08/2021   Procedure: CYSTOSCOPY/RETROGRADE/URETEROSCOPY/HOLMIUM LASER/STENT PLACEMENT;  Surgeon: Franchot Gallo, MD;  Location: [facility];  Service: Urology;  Laterality: Right;   KNEE  ARTHROSCOPY  07/22/2011   Procedure: ARTHROSCOPY KNEE;  Surgeon: [name];  Location: [place];  Service: Orthopedics;  Laterality: Right;  WITH DEBRIDEMENT    KNEE ARTHROSCOPY W/ MENISCECTOMY  X2 IN 2002-- RIGHT KNEE   LAPAROSCOPIC GASTRIC BANDING  01-15-09   LEFT HEART CATH AND CORONARY ANGIOGRAPHY N/A 04/29/2017   Procedure: LEFT HEART CATH AND CORONARY ANGIOGRAPHY;  Surgeon: [name], MD;  Location: [place] CV LAB;  Service: Cardiovascular;  Laterality: N/A;   LEFT HEART CATHETERIZATION WITH CORONARY ANGIOGRAM N/A 09/11/2013   Procedure: LEFT HEART CATHETERIZATION WITH CORONARY ANGIOGRAM;  Surgeon: [name], MD;  Location: [facility] CATH LAB;  Service: Cardiovascular;  Laterality: N/A;   MITRAL VALVE REPAIR Right 03/25/2016   Procedure: MINIMALLY INVASIVE REOPERATION FOR MITRAL VALVE REPAIR  (MVR) with size 26 Sorin Memo 3D;  Surgeon: [name], MD;  Location: [place];  Service: Open Heart Surgery;  Laterality: Right;   MITRAL VALVE REPAIR  03/2016   RIGHT KNEE ED COMPARTMENT REPLACEMENT  2004   SYNOVECTOMY  07/22/2011   Procedure: SYNOVECTOMY;  Surgeon: Dione Plover Aluisio;  Location: [place];  Service: Orthopedics;;   TEE WITHOUT CARDIOVERSION N/A 01/23/2016   Procedure: TRANSESOPHAGEAL ECHOCARDIOGRAM (TEE);  Surgeon: [name], MD;  Location: [place];  Service: Cardiovascular;  Laterality: N/A;   TEE WITHOUT CARDIOVERSION N/A 03/25/2016   Procedure: TRANSESOPHAGEAL ECHOCARDIOGRAM (TEE);  Surgeon: [name], MD;  Location: [place];  Service: Open Heart Surgery;  Laterality: N/A;   UMBILICAL HERNIA REPAIR  01-15-09   W/ GASTRIC BANDING PROCEDURE    Family History  Problem Relation Age of Onset   Other Mother        joint problems   Alzheimer's disease Mother    Hypertension Father    Heart disease Father    CVA Father        age 76   Depression Father    Heart disease Sister        CABG   Stroke Sister    Heart  failure Sister        congestive   Diabetes Sister    Heart disease Brother    Hypertension Brother    Congestive Heart Failure Brother     Social History:  reports that he has never smoked. He has never used smokeless tobacco. He reports previous alcohol use of about 2.0 standard drinks of alcohol per week. He reports that he does not use drugs.  Allergies:  Allergies  Allergen Reactions   Crestor [Rosuvastatin] Other (See Comments)    Muscle aches    Lipitor [Atorvastatin] Other (See Comments)    Muscle  aches   Pravastatin Other (See Comments)    Muscle aches   Carvedilol     Other reaction(s): loss of appetite   Metformin Diarrhea   Serotonin Reuptake Inhibitors (Ssris)     Other reaction(s): Muscle aches   Zocor [Simvastatin]     Other reaction(s): Muscle pain   Codeine Itching and Other (See Comments)    Extremity tingling-- can take synthetic    Medications: I have reviewed the patient's current medications.  Results for orders placed or performed during the hospital encounter of 02/26/21 (from the past 48 hour(s))  Glucose, capillary     Status: Abnormal   Collection Time: 02/27/21  8:49 PM  Result Value Ref Range   Glucose-Capillary 141 (H) 70 - 99 mg/dL    Comment: Glucose reference range applies only to samples taken after fasting for at least 8 hours.  Glucose, capillary     Status: Abnormal   Collection Time: 02/28/21  7:54 AM  Result Value Ref Range   Glucose-Capillary 128 (H) 70 - 99 mg/dL    Comment: Glucose reference range applies only to samples taken after fasting for at least 8 hours.  CBC with Differential/Platelet     Status: Abnormal   Collection Time: 02/28/21  8:17 AM  Result Value Ref Range   WBC 10.3 4.0 - 10.5 K/uL   RBC 2.90 (L) 4.22 - 5.81 MIL/uL   Hemoglobin 9.7 (L) 13.0 - 17.0 g/dL   HCT 28.1 (L) 39.0 - 52.0 %   MCV 96.9 80.0 - 100.0 fL   MCH 33.4 26.0 - 34.0 pg   MCHC 34.5 30.0 - 36.0 g/dL   RDW 13.5 11.5 - 15.5 %   Platelets 196  150 - 400 K/uL   nRBC 0.0 0.0 - 0.2 %   Neutrophils Relative % 74 %   Neutro Abs 7.7 1.7 - 7.7 K/uL   Lymphocytes Relative 11 %   Lymphs Abs 1.1 0.7 - 4.0 K/uL   Monocytes Relative 11 %   Monocytes Absolute 1.1 (H) 0.1 - 1.0 K/uL   Eosinophils Relative 3 %   Eosinophils Absolute 0.3 0.0 - 0.5 K/uL   Basophils Relative 0 %   Basophils Absolute 0.0 0.0 - 0.1 K/uL   Immature Granulocytes 1 %   Abs Immature Granulocytes 0.10 (H) 0.00 - 0.07 K/uL    Comment: Performed at [facility], [place] [address], [place], [place] [address]  Basic metabolic panel     Status: Abnormal   Collection Time: 02/28/21  8:17 AM  Result Value Ref Range   Sodium 130 (L) 135 - 145 mmol/L   Potassium 4.2 3.5 - 5.1 mmol/L   Chloride 98 98 - 111 mmol/L   CO2 24 22 - 32 mmol/L   Glucose, Bld 121 (H) 70 - 99 mg/dL    Comment: Glucose reference range applies only to samples taken after fasting for at least 8 hours.   BUN 45 (H) 8 - 23 mg/dL   Creatinine, Ser 2.73 (H) 0.61 - 1.24 mg/dL   Calcium 9.4 8.9 - 10.3 mg/dL   GFR, Estimated 24 (L) >60 mL/min    Comment: (NOTE) Calculated using the CKD-EPI Creatinine Equation (2021)    Anion gap 8 5 - 15    Comment: Performed at [facility], [place] [address], [place], [place] [address]  Glucose, capillary     Status: Abnormal   Collection Time: 02/28/21 10:56 AM  Result Value Ref Range   Glucose-Capillary 116 (  H) 70 - 99 mg/dL    Comment: Glucose reference range applies only to samples taken after fasting for at least 8 hours.  Culture, blood (Routine X 2) w Reflex to ID Panel     Status: None (Preliminary result)   Collection Time: 02/28/21  4:11 PM   Specimen: BLOOD  Result Value Ref Range   Specimen Description      BLOOD RIGHT ANTECUBITAL Performed at [facility], [place] [address], [place], [place] [address]    Special Requests      BOTTLES DRAWN AEROBIC AND ANAEROBIC Blood Culture adequate  volume Performed at [place] [address], [place], [place] [address]    Culture      NO GROWTH < 12 HOURS Performed at [place] [address] [place], [place] [address]    Report Status PENDING   Culture, blood (Routine X 2) w Reflex to ID Panel     Status: None (Preliminary result)   Collection Time: 02/28/21  4:22 PM   Specimen: BLOOD RIGHT HAND  Result Value Ref Range   Specimen Description      BLOOD RIGHT HAND Performed at [place] [address], [place], [place] [address]    Special Requests      BOTTLES DRAWN AEROBIC AND ANAEROBIC Blood Culture adequate volume Performed at [place] [address] [place], [place] [address]    Culture      NO GROWTH < 12 HOURS Performed at [place] [address] [place], [place] [address]    Report Status PENDING   Glucose, capillary     Status: Abnormal   Collection Time: 02/28/21  5:13 PM  Result Value Ref Range   Glucose-Capillary 163 (H) 70 - 99 mg/dL    Comment: Glucose reference range applies only to samples taken after fasting for at least 8 hours.  Glucose, capillary     Status: Abnormal   Collection Time: 02/28/21  8:40 PM  Result Value Ref Range   Glucose-Capillary 142 (H) 70 - 99 mg/dL    Comment: Glucose reference range applies only to samples taken after fasting for at least 8 hours.  CBC with Differential/Platelet     Status: Abnormal   Collection Time: 03/01/21  2:57 AM  Result Value Ref Range   WBC 8.8 4.0 - 10.5 K/uL   RBC 2.63 (L) 4.22 - 5.81 MIL/uL   Hemoglobin 8.8 (L) 13.0 - 17.0 g/dL   HCT 25.8 (L) 39.0 - 52.0 %   MCV 98.1 80.0 - 100.0 fL   MCH 33.5 26.0 - 34.0 pg   MCHC 34.1 30.0 - 36.0 g/dL   RDW 13.6 11.5 - 15.5 %   Platelets 215 150 - 400 K/uL   nRBC 0.0 0.0 - 0.2 %   Neutrophils Relative % 74 %   Neutro Abs 6.6 1.7 - 7.7 K/uL   Lymphocytes Relative 9 %   Lymphs Abs 0.8 0.7 - 4.0 K/uL   Monocytes  Relative 10 %   Monocytes Absolute 0.8 0.1 - 1.0 K/uL   Eosinophils Relative 4 %   Eosinophils Absolute 0.3 0.0 - 0.5 K/uL   Basophils Relative 1 %   Basophils Absolute 0.1 0.0 - 0.1 K/uL   Immature Granulocytes 2 %   Abs Immature Granulocytes 0.15 (H) 0.00 - 0.07 K/uL    Comment: Performed at [facility], [place] [address] [place], [place] [address]  Basic  metabolic panel     Status: Abnormal   Collection Time: 03/01/21  2:57 AM  Result Value Ref Range   Sodium 131 (L) 135 - 145 mmol/L   Potassium 3.7 3.5 - 5.1 mmol/L   Chloride 97 (L) 98 - 111 mmol/L   CO2 24 22 - 32 mmol/L   Glucose, Bld 114 (H) 70 - 99 mg/dL    Comment: Glucose reference range applies only to samples taken after fasting for at least 8 hours.   BUN 45 (H) 8 - 23 mg/dL   Creatinine, Ser 2.68 (H) 0.61 - 1.24 mg/dL   Calcium 9.1 8.9 - 10.3 mg/dL   GFR, Estimated 24 (L) >60 mL/min    Comment: (NOTE) Calculated using the CKD-EPI Creatinine Equation (2021)    Anion gap 10 5 - 15    Comment: Performed at [facility], [place] [address], [place], [place] [address]  Glucose, capillary     Status: Abnormal   Collection Time: 03/01/21  7:35 AM  Result Value Ref Range   Glucose-Capillary 109 (H) 70 - 99 mg/dL    Comment: Glucose reference range applies only to samples taken after fasting for at least 8 hours.  Glucose, capillary     Status: Abnormal   Collection Time: 03/01/21 11:25 AM  Result Value Ref Range   Glucose-Capillary 155 (H) 70 - 99 mg/dL    Comment: Glucose reference range applies only to samples taken after fasting for at least 8 hours.  Glucose, capillary     Status: Abnormal   Collection Time: 03/01/21  5:22 PM  Result Value Ref Range   Glucose-Capillary 147 (H) 70 - 99 mg/dL    Comment: Glucose reference range applies only to samples taken after fasting for at least 8 hours.    DG Knee 1-2 Views Left  Result Date: 02/28/2021 CLINICAL DATA:  Anterior knee pain  EXAM: LEFT KNEE - 1-2 VIEW COMPARISON:  None. FINDINGS: No fracture or dislocation of the left knee. Minimal medial compartment joint space loss. Large, nonspecific knee joint effusion. Soft tissue edema about the anterior knee. Vascular calcinosis. IMPRESSION: 1.  No fracture or dislocation of the left knee. 2.  Large, nonspecific knee joint effusion 3.  Soft tissue edema about the anterior knee. Electronically Signed   By: Eddie Candle M.D.   On: 02/28/2021 19:15   MR BRAIN WO CONTRAST  Result Date: 02/28/2021 CLINICAL DATA:  Initial evaluation for possible stroke. EXAM: MRI HEAD WITHOUT CONTRAST TECHNIQUE: Multiplanar, multiecho pulse sequences of the brain and surrounding structures were obtained without intravenous contrast. COMPARISON:  Recent MRI from 02/01/2021. FINDINGS: Brain: Generalized age-related cerebral atrophy. Again seen is patchy and confluent T2/FLAIR hyperintensity involving the periventricular, deep, and subcortical white matter both cerebral hemispheres, most like related chronic microvascular ischemic disease, moderately advanced in nature. Few scattered superimposed remote lacunar infarcts about the hemispheric cerebral white matter are seen, stable. Small remote left thalamic lacunar infarct noted as well, stable. Small remote cortical infarcts involving the left frontal and right parieto-occipital region again noted, also stable. Tiny remote right cerebellar infarct noted There has been interval evolution of recently seen left cerebral white matter infarct, now demonstrating essentially resolved diffusion signal abnormality. There is a new 6 mm focus of diffusion abnormality involving the posterior left lentiform nucleus (series 5, image 71), new from prior. Associated signal loss on corresponding ADC map (series 6, image 23). Finding consistent with a small acute ischemic infarct. No other diffusion abnormality to suggest  acute or subacute ischemia elsewhere within the brain.  Gray-white matter differentiation otherwise maintained. No acute intracranial hemorrhage. Few scattered chronic micro hemorrhages again noted involving the right greater than left cerebral hemispheres as well as the cerebellum, stable. No mass lesion, midline shift or mass effect. No hydrocephalus or extra-axial fluid collection. Pituitary gland and suprasellar region within normal limits. Midline structures intact. Vascular: Major intracranial vascular flow voids are maintained. Skull and upper cervical spine: Craniocervical junction within normal limits. Bone marrow signal intensity within normal limits. No scalp soft tissue abnormality. Sinuses/Orbits: Patient status post bilateral ocular lens replacement. Globes and orbital soft tissues demonstrate no acute finding. Scattered mucosal thickening noted within the ethmoidal air cells. Paranasal sinuses are otherwise largely clear. Trace left mastoid effusion, of doubtful significance. Inner ear structures grossly normal. Other: None. IMPRESSION: 1. New 6 mm acute ischemic nonhemorrhagic infarct involving the posterior left lentiform nucleus. 2. Normal expected interval evolution of recently identified small left cerebral white matter infarct. 3. Underlying age-related cerebral atrophy with moderate chronic microvascular ischemic disease and multiple remote ischemic infarcts as above, stable. Electronically Signed   By: Jeannine Boga M.D.   On: 02/28/2021 23:43   MR LUMBAR SPINE WO CONTRAST  Result Date: 02/28/2021 CLINICAL DATA:  Initial evaluation for low back pain, bacteremia, infection suspected. EXAM: MRI LUMBAR SPINE WITHOUT CONTRAST TECHNIQUE: Multiplanar, multisequence MR imaging of the lumbar spine was performed. No intravenous contrast was administered. COMPARISON:  Prior radiograph from 02/26/2021. FINDINGS: Segmentation: Standard. Lowest well-formed disc space labeled the L5-S1 level. Alignment: Trace retrolisthesis of L3 on L4 and L4 on L5,  with 3 mm anterolisthesis of L5 on S1. Underlying dextroscoliosis. Vertebrae: Mild chronic wedging deformity noted at the superior endplate of L2. Vertebral body height otherwise maintained without acute or subacute fracture. Bone marrow signal intensity within normal limits. No worrisome osseous lesions. Discogenic reactive endplate change present about the L4-5 interspace. No other abnormal marrow edema. No findings to suggest osteomyelitis discitis or septic arthritis. Conus medullaris and cauda equina: Conus extends to the L1 level. Conus and cauda equina appear normal. Paraspinal and other soft tissues: Paraspinous soft tissues demonstrate no acute finding. Visualized kidneys are somewhat atrophic. A few small subcentimeter simple cysts noted within the visualized right kidney. Circumaortic left renal vein noted. Disc levels: T12-L1: Mild disc bulge with disc desiccation and reactive endplate spurring. No spinal stenosis. Foramina remain patent. L1-2: Mild disc bulge with disc desiccation. Prominent endplate osteophytic spurring on the right. No spinal stenosis. Foramina remain patent. L2-3: Mild disc bulge with disc desiccation. Associated reactive endplate spurring. Mild bilateral facet hypertrophy. No significant spinal stenosis. Foramina remain patent. L3-4: Mild disc bulge with disc desiccation. Mild bilateral facet hypertrophy. No significant spinal stenosis. Foramina remain patent. L4-5: Degenerative intervertebral disc space narrowing with disc desiccation and mild diffuse disc bulge. Associated reactive endplate change with marginal endplate osteophytic spurring, greater on the left. Moderate facet hypertrophy. 0.4 x 1.2 cm cystic lesion at the anteromedial aspect of the left L4-5 facet most likely reflects a synovial cyst (series 8, image 30). Additional synovial cyst at the inferior aspect of the left L4-5 facet measures 1.6 cm (series 7, image 13). Changes resultant severe canal with severe right  worse than left lateral recess stenosis. Moderate left worse than right L4 foraminal narrowing. L5-S1: Anterolisthesis. Disc desiccation with mild disc bulge. Superimposed left foraminal disc protrusion contacts the exiting left L5 nerve root (series 8, image 36). Moderate right worse than left facet hypertrophy. No  significant spinal stenosis. Mild bilateral L5 foraminal narrowing. IMPRESSION: 1. No MRI evidence for acute infection within the lumbar spine. 2. Multifactorial degenerative changes at L4-5 with resultant severe canal with right worse than left lateral recess stenosis, with moderate left worse than right L4 foraminal narrowing. 3. Small left foraminal disc protrusion at L5-S1, contacting and potentially irritating the exiting left L5 nerve root. 4. Additional mild noncompressive disc bulging and facet hypertrophy at L1-2 through L3-4 without significant stenosis or neural impingement. Electronically Signed   By: Jeannine Boga M.D.   On: 02/28/2021 23:55    ROS: No recent fever, chills, nausea, vomiting or changes in his appetite.  10 system review was otherwise negative. PE:  Blood pressure (!) 107/58, pulse 72, temperature 97.8 F (36.6 C), temperature source Oral, resp. rate 18, height 5\' 10"  (1.778 m), weight 87.6 kg, SpO2 100 %. Well-nourished well-developed man in no apparent distress.  Alert and oriented x4.  Normal affect.  Left knee has a moderate effusion.  No erythema or warmth.  Tender to palpation along parapatellar retinaculum medially and laterally.  Range of motion is 0 to about 90 degrees with pain at the extremes.  5 out of 5 strength of the quad.  No lymphadenopathy or lymphangitis.  There is ecchymosis at the tibial tubercle extending up to the inferior pole of the patella consistent with his history recent fall. .  Skin is healthy and intact.  He feels Light touch dorsally and plantarly at the foot.  Assessment/Plan: Left knee contusion and effusion -the patient's  confusion is likely reactive from his recent fall and underlying arthritis.  However given his history of recent bacteremia I believe its necessary to aspirate the knee for cultures and crystal exam as well as cell count.  He understands the risks and benefits of left knee aspiration and elects to proceed.  Procedure: After informed consent and sterile prep I aspirated 30 cc of clear straw-colored fluid from the knee joint.  He had immediate improvement in his pain.  He tolerated the procedure well with no evident complications.  We will follow his cultures, cell count and crystal exam.  Wylene Simmer 03/01/2021, 6:22 PM

## 2021-03-02 DIAGNOSIS — D500 Iron deficiency anemia secondary to blood loss (chronic): Secondary | ICD-10-CM

## 2021-03-02 LAB — CBC WITH DIFFERENTIAL/PLATELET
Abs Immature Granulocytes: 0.19 10*3/uL — ABNORMAL HIGH (ref 0.00–0.07)
Basophils Absolute: 0 10*3/uL (ref 0.0–0.1)
Basophils Relative: 1 %
Eosinophils Absolute: 0.5 10*3/uL (ref 0.0–0.5)
Eosinophils Relative: 6 %
HCT: 25.7 % — ABNORMAL LOW (ref 39.0–52.0)
Hemoglobin: 8.8 g/dL — ABNORMAL LOW (ref 13.0–17.0)
Immature Granulocytes: 3 %
Lymphocytes Relative: 9 %
Lymphs Abs: 0.7 10*3/uL (ref 0.7–4.0)
MCH: 33.6 pg (ref 26.0–34.0)
MCHC: 34.2 g/dL (ref 30.0–36.0)
MCV: 98.1 fL (ref 80.0–100.0)
Monocytes Absolute: 0.8 10*3/uL (ref 0.1–1.0)
Monocytes Relative: 11 %
Neutro Abs: 5.5 10*3/uL (ref 1.7–7.7)
Neutrophils Relative %: 70 %
Platelets: 232 10*3/uL (ref 150–400)
RBC: 2.62 MIL/uL — ABNORMAL LOW (ref 4.22–5.81)
RDW: 13.6 % (ref 11.5–15.5)
WBC: 7.6 10*3/uL (ref 4.0–10.5)
nRBC: 0 % (ref 0.0–0.2)

## 2021-03-02 LAB — GLUCOSE, CAPILLARY
Glucose-Capillary: 115 mg/dL — ABNORMAL HIGH (ref 70–99)
Glucose-Capillary: 161 mg/dL — ABNORMAL HIGH (ref 70–99)
Glucose-Capillary: 170 mg/dL — ABNORMAL HIGH (ref 70–99)
Glucose-Capillary: 119 mg/dL — ABNORMAL HIGH (ref 70–99)

## 2021-03-02 NOTE — Progress Notes (Signed)
Placed patient on CPAP for the night with pressure set at 6cm.  

## 2021-03-02 NOTE — Plan of Care (Signed)
  Problem: Activity: Goal: Risk for activity intolerance will decrease Outcome: Progressing   Problem: Nutrition: Goal: Adequate nutrition will be maintained Outcome: Progressing   Problem: Pain Managment: Goal: General experience of comfort will improve Outcome: Progressing   Problem: Safety: Goal: Ability to remain free from injury will improve Outcome: Progressing   

## 2021-03-02 NOTE — Progress Notes (Signed)
PROGRESS NOTE  RULON ABDALLA BPZ:[id number] DOB: 1946-02-16 DOA: 02/26/2021 PCP: [name], MD  HPI/Recap of past 24 hours: Patient seen and examined at bedside patient seems to be in pain on his left knee Also complaining of back pain  March 02, 2021: Patient seen and examined at bedside he stated his left knee pain is much better after some fluid was drained yesterday.  Assessment/Plan: Principal Problem:   Anemia Active Problems:   Type 2 diabetes mellitus without complications (HCC)   Essential hypertension   CAD, AUTOLOGOUS BYPASS GRAFT   Diastolic CHF, chronic (HCC)   Hyponatremia   Weakness generalized   CKD (chronic kidney disease) stage 4, GFR 15-29 ml/min (HCC)   Atrial fibrillation, chronic (HCC)   Chronic anticoagulation   Fever   Prolonged QT interval   Bacteremia due to Gram-positive bacteria   Effusion of left knee joint  1. enterococcal bacteremia patient was to have TEE done but was held due to his hemoglobin that is low they want him to have hemoglobin of above 9. His hemoglobin today is 8.8 we will continue to monitor Continue antibiotics patient is afebrile at this time Blood cultures 2 shows no growth after 48 hours infectious diseases following  2.  Left knee pain with effusion.  X-ray reveals large effusion of the left knee. I will consult orthopedic Will apply Ace wrap Will apply lidocaine patch for pain control Continue on the oral pain medication Appreciate orthopedic consult with aspiration of the left knee The synovial fluid analysis shows a white count of 12.3.  His blood cultures are negative x2 after 48 hours Orthopedic following  3.  Coronary artery disease with history of stroke.  Patient has been on Eliquis and aspirin and 4.  Chronic atrial fibrillation on Eliquis and amiodarone  5.  Chronic kidney disease stage IV.  Creatinine is stable  6.  Chronic diastolic CHF well compensated no issues at this time  7 obstructive  sleep apnea patient uses CPAP  Code Status: Patient seen and examined at bedside complaining of severe left knee pain with swelling  Severity of Illness: The appropriate patient status for this patient is INPATIENT. Inpatient status is judged to be reasonable and necessary in order to provide the required intensity of service to ensure the patient's safety. The patient's presenting symptoms, physical exam findings, and initial radiographic and laboratory data in the context of their chronic comorbidities is felt to place them at high risk for further clinical deterioration. Furthermore, it is not anticipated that the patient will be medically stable for discharge from the hospital within 2 midnights of admission. The following factors support the patient status of inpatient.   " Patient still requires IV antibiotics has knee effusion probably need to have drainage by orthopedic  * I certify that at the point of admission it is my clinical judgment that the patient will require inpatient hospital care spanning beyond 2 midnights from the point of admission due to high intensity of service, high risk for further deterioration and high frequency of surveillance required.*   Family Communication: Wife [name] at bedside  Disposition Plan: remains inpatient    Consultants: Orthopedic Infectious disease Cardiology  Procedures: None    Antimicrobials: Ceftriaxone IV ampicillin  DVT prophylaxis: Eliquis   Objective: Vitals:   03/01/21 2000 03/01/21 2002 03/01/21 2246 03/02/21 0523  BP:  (!) 110/57  118/67  Pulse:  (!) 57 81 62  Resp: 14  18 18   Temp:  98.3  F (36.8 C)  98.4 F (36.9 C)  TempSrc:  Oral  Oral  SpO2:  100% 96% 97%  Weight:    86.5 kg  Height:        Intake/Output Summary (Last 24 hours) at 03/02/2021 1003 Last data filed at 03/02/2021 0913 Gross per 24 hour  Intake 1304.33 ml  Output 800 ml  Net 504.33 ml    Filed Weights   02/28/21 0411 03/01/21 0156  03/02/21 0523  Weight: 86.2 kg 87.6 kg 86.5 kg   Body mass index is 27.36 kg/m.  Exam:  General: 75 y.o. year-old male well developed well nourished in no acute distress.  Alert and oriented x3.  Patient is using his CPAP currently Cardiovascular: Regular rate and rhythm with no rubs or gallops.  No thyromegaly or JVD noted.   Respiratory: Clear to auscultation with no wheezes or rales. Good inspiratory effort. Abdomen: Soft nontender nondistended with normal bowel sounds x4 quadrants. Musculoskeletal: No lower extremity edema. 2/4 pulses in all 4 extremities. Left knee has diffuse swelling with fluctuance and tenderness.  Reduced range of motion Skin: No ulcerative lesions noted or rashes, Psychiatry: Mood is appropriate for condition and setting    Data Reviewed: CBC: Recent Labs  Lab 02/26/21 0236 02/27/21 0337 02/28/21 0817 03/01/21 0257 03/02/21 0313  WBC 9.2 12.8* 10.3 8.8 7.6  NEUTROABS 7.7  --  7.7 6.6 5.5  HGB 8.9* 9.0* 9.7* 8.8* 8.8*  HCT 25.8* 26.4* 28.1* 25.8* 25.7*  MCV 98.5 98.5 96.9 98.1 98.1  PLT 164 164 196 215 151    Basic Metabolic Panel: Recent Labs  Lab 02/26/21 0236 02/27/21 0337 02/28/21 0817 03/01/21 0257  NA 127* 132* 130* 131*  K 3.8 4.2 4.2 3.7  CL 96* 100 98 97*  CO2 23 24 24 24   GLUCOSE 178* 153* 121* 114*  BUN 52* 45* 45* 45*  CREATININE 3.17* 2.52* 2.73* 2.68*  CALCIUM 8.8* 9.0 9.4 9.1    GFR: Estimated Creatinine Clearance: 24.6 mL/min (A) (by C-G formula based on SCr of 2.68 mg/dL (H)). Liver Function Tests: Recent Labs  Lab 02/26/21 0236  AST 36  ALT 36  ALKPHOS 94  BILITOT 1.1  PROT 5.2*  ALBUMIN 2.8*    No results for input(s): LIPASE, AMYLASE in the last 168 hours. No results for input(s): AMMONIA in the last 168 hours. Coagulation Profile: No results for input(s): INR, PROTIME in the last 168 hours. Cardiac Enzymes: No results for input(s): CKTOTAL, CKMB, CKMBINDEX, TROPONINI in the last 168 hours. BNP  (last 3 results) No results for input(s): PROBNP in the last 8760 hours. HbA1C: No results for input(s): HGBA1C in the last 72 hours. CBG: Recent Labs  Lab 03/01/21 0735 03/01/21 1125 03/01/21 1722 03/01/21 2034 03/02/21 0810  GLUCAP 109* 155* 147* 146* 115*    Lipid Profile: No results for input(s): CHOL, HDL, LDLCALC, TRIG, CHOLHDL, LDLDIRECT in the last 72 hours. Thyroid Function Tests: No results for input(s): TSH, T4TOTAL, FREET4, T3FREE, THYROIDAB in the last 72 hours. Anemia Panel: No results for input(s): VITAMINB12, FOLATE, FERRITIN, TIBC, IRON, RETICCTPCT in the last 72 hours. Urine analysis:    Component Value Date/Time   COLORURINE YELLOW 02/26/2021 0236   APPEARANCEUR CLEAR 02/26/2021 0236   LABSPEC 1.010 02/26/2021 0236   PHURINE 5.0 02/26/2021 0236   GLUCOSEU NEGATIVE 02/26/2021 0236   HGBUR MODERATE (A) 02/26/2021 0236   BILIRUBINUR NEGATIVE 02/26/2021 0236   KETONESUR NEGATIVE 02/26/2021 0236   PROTEINUR NEGATIVE 02/26/2021 0236  UROBILINOGEN 0.2 04/04/2015 1529   NITRITE NEGATIVE 02/26/2021 0236   LEUKOCYTESUR NEGATIVE 02/26/2021 0236   Sepsis Labs: @LABRCNTIP (procalcitonin:4,lacticidven:4)  ) Recent Results (from the past 240 hour(s))  Resp Panel by RT-PCR (Flu A&B, Covid) Nasopharyngeal Swab     Status: None   Collection Time: 02/26/21  2:36 AM   Specimen: Nasopharyngeal Swab; Nasopharyngeal(NP) swabs in vial transport medium  Result Value Ref Range Status   SARS Coronavirus 2 by RT PCR NEGATIVE NEGATIVE Final    Comment: (NOTE) SARS-CoV-2 target nucleic acids are NOT DETECTED.  The SARS-CoV-2 RNA is generally detectable in upper respiratory specimens during the acute phase of infection. The lowest concentration of SARS-CoV-2 viral copies this assay can detect is 138 copies/mL. A negative result does not preclude SARS-Cov-2 infection and should not be used as the sole basis for treatment or other patient management decisions. A negative  result may occur with  improper specimen collection/handling, submission of specimen other than nasopharyngeal swab, presence of viral mutation(s) within the areas targeted by this assay, and inadequate number of viral copies(<138 copies/mL). A negative result must be combined with clinical observations, patient history, and epidemiological information. The expected result is Negative.  Fact Sheet for Patients:  EntrepreneurPulse.com.au  Fact Sheet for Healthcare Providers:  IncredibleEmployment.be  This test is no t yet approved or cleared by the [place] FDA and  has been authorized for detection and/or diagnosis of SARS-CoV-2 by FDA under an Emergency Use Authorization (EUA). This EUA will remain  in effect (meaning this test can be used) for the duration of the COVID-19 declaration under Section 564(b)(1) of the Act, 21 U.S.C.section 360bbb-3(b)(1), unless the authorization is terminated  or revoked sooner.       Influenza A by PCR NEGATIVE NEGATIVE Final   Influenza B by PCR NEGATIVE NEGATIVE Final    Comment: (NOTE) The Xpert Xpress SARS-CoV-2/FLU/RSV plus assay is intended as an aid in the diagnosis of influenza from Nasopharyngeal swab specimens and should not be used as a sole basis for treatment. Nasal washings and aspirates are unacceptable for Xpert Xpress SARS-CoV-2/FLU/RSV testing.  Fact Sheet for Patients: EntrepreneurPulse.com.au  Fact Sheet for Healthcare Providers: IncredibleEmployment.be  This test is not yet approved or cleared by the [place] FDA and has been authorized for detection and/or diagnosis of SARS-CoV-2 by FDA under an Emergency Use Authorization (EUA). This EUA will remain in effect (meaning this test can be used) for the duration of the COVID-19 declaration under Section 564(b)(1) of the Act, 21 U.S.C. section 360bbb-3(b)(1), unless the authorization is  terminated or revoked.  Performed at [facility], [place] [address], [place], [place] [address]   Blood Culture (routine x 2)     Status: Abnormal   Collection Time: 02/26/21  2:36 AM   Specimen: BLOOD  Result Value Ref Range Status   Specimen Description   Final    BLOOD SPECIMEN SOURCE NOT MARKED ON REQUISITION Performed at [facility] [address] [place], [place] [address]    Special Requests   Final    BOTTLES DRAWN AEROBIC AND ANAEROBIC Blood Culture adequate volume Performed at [facility] [address] [place], [place] [address]    Culture  Setup Time   Final    GRAM POSITIVE COCCI IN CHAINS IN BOTH AEROBIC AND ANAEROBIC BOTTLES Organism ID to follow CRITICAL RESULT CALLED TO, READ BACK BY AND VERIFIED WITHCristopher Estimable PHARMD 1902 02/26/21 A BROWNING Performed at [facility]  Lab, [address] [place], [place] [address]    Culture ENTEROCOCCUS FAECALIS (A)  Final   Report Status 02/28/2021 FINAL  Final   Organism ID, Bacteria ENTEROCOCCUS FAECALIS  Final      Susceptibility   Enterococcus faecalis - MIC*    AMPICILLIN <=2 SENSITIVE Sensitive     VANCOMYCIN 1 SENSITIVE Sensitive     GENTAMICIN SYNERGY SENSITIVE Sensitive     * ENTEROCOCCUS FAECALIS  Urine culture     Status: Abnormal   Collection Time: 02/26/21  2:36 AM   Specimen: In/Out Cath Urine  Result Value Ref Range Status   Specimen Description   Final    IN/OUT CATH URINE Performed at [facility] [address], [place], [place] [address]    Special Requests   Final    NONE Performed at [facility], [place] [address], [place], [place] [address]    Culture (A)  Final    <10,000 COLONIES/mL INSIGNIFICANT GROWTH Performed at [facility] [address] [place], [place] [address]    Report Status 02/27/2021 FINAL  Final  Blood Culture ID Panel (Reflexed)     Status: Abnormal   Collection  Time: 02/26/21  2:36 AM  Result Value Ref Range Status   Enterococcus faecalis DETECTED (A) NOT DETECTED Final    Comment: CRITICAL RESULT CALLED TO, READ BACK BY AND VERIFIED WITH: E WILLIAMSON PHARMD 1902 02/26/21 A BROWNING    Enterococcus Faecium NOT DETECTED NOT DETECTED Final   Listeria monocytogenes NOT DETECTED NOT DETECTED Final   Staphylococcus species NOT DETECTED NOT DETECTED Final   Staphylococcus aureus (BCID) NOT DETECTED NOT DETECTED Final   Staphylococcus epidermidis NOT DETECTED NOT DETECTED Final   Staphylococcus lugdunensis NOT DETECTED NOT DETECTED Final   Streptococcus species NOT DETECTED NOT DETECTED Final   Streptococcus agalactiae NOT DETECTED NOT DETECTED Final   Streptococcus pneumoniae NOT DETECTED NOT DETECTED Final   Streptococcus pyogenes NOT DETECTED NOT DETECTED Final   A.calcoaceticus-baumannii NOT DETECTED NOT DETECTED Final   Bacteroides fragilis NOT DETECTED NOT DETECTED Final   Enterobacterales NOT DETECTED NOT DETECTED Final   Enterobacter cloacae complex NOT DETECTED NOT DETECTED Final   Escherichia coli NOT DETECTED NOT DETECTED Final   Klebsiella aerogenes NOT DETECTED NOT DETECTED Final   Klebsiella oxytoca NOT DETECTED NOT DETECTED Final   Klebsiella pneumoniae NOT DETECTED NOT DETECTED Final   Proteus species NOT DETECTED NOT DETECTED Final   Salmonella species NOT DETECTED NOT DETECTED Final   Serratia marcescens NOT DETECTED NOT DETECTED Final   Haemophilus influenzae NOT DETECTED NOT DETECTED Final   Neisseria meningitidis NOT DETECTED NOT DETECTED Final   Pseudomonas aeruginosa NOT DETECTED NOT DETECTED Final   Stenotrophomonas maltophilia NOT DETECTED NOT DETECTED Final   Candida albicans NOT DETECTED NOT DETECTED Final   Candida auris NOT DETECTED NOT DETECTED Final   Candida glabrata NOT DETECTED NOT DETECTED Final   Candida krusei NOT DETECTED NOT DETECTED Final   Candida parapsilosis NOT DETECTED NOT DETECTED Final   Candida  tropicalis NOT DETECTED NOT DETECTED Final   Cryptococcus neoformans/gattii NOT DETECTED NOT DETECTED Final   Vancomycin resistance NOT DETECTED NOT DETECTED Final    Comment: Performed at [facility], [address] [place], [place] [address]  Culture, blood (Routine X 2) w Reflex to ID Panel     Status: None (Preliminary result)   Collection Time: 02/28/21  4:11 PM   Specimen: BLOOD  Result Value Ref  Range Status   Specimen Description   Final    BLOOD RIGHT ANTECUBITAL Performed at [facility] [address] [place], [place] [address]    Special Requests   Final    BOTTLES DRAWN AEROBIC AND ANAEROBIC Blood Culture adequate volume Performed at [facility] [address] [place], [place] [address]    Culture   Final    NO GROWTH 2 DAYS Performed at [facility] [address] [place], [place] [address]    Report Status PENDING  Incomplete  Culture, blood (Routine X 2) w Reflex to ID Panel     Status: None (Preliminary result)   Collection Time: 02/28/21  4:22 PM   Specimen: BLOOD RIGHT HAND  Result Value Ref Range Status   Specimen Description   Final    BLOOD RIGHT HAND Performed at [name] [address], [place], [place] [address]    Special Requests   Final    BOTTLES DRAWN AEROBIC AND ANAEROBIC Blood Culture adequate volume Performed at [facility] [address], [place],  [address]    Culture   Final    NO GROWTH 2 DAYS Performed at [facility] [address] [place],  [address]    Report Status PENDING  Incomplete      Studies: No results found.  Scheduled Meds:  amiodarone  200 mg Oral Daily   apixaban  5 mg Oral BID   calcitRIOL  0.25 mcg Oral QODAY   chlorhexidine  15 mL Mouth Rinse BID   insulin aspart  0-5 Units Subcutaneous QHS   insulin aspart  0-9 Units Subcutaneous TID WC   lidocaine  1 patch Transdermal Q24H    loratadine  10 mg Oral Daily   mouth rinse  15 mL Mouth Rinse q12n4p   traZODone  100 mg Oral QHS    Continuous Infusions:  sodium chloride Stopped (02/27/21 1600)   ampicillin (OMNIPEN) IV 2 g (03/02/21 0334)   cefTRIAXone (ROCEPHIN)  IV Stopped (03/02/21 0005)     LOS: 4 days     Cristal Deer, MD [facility]  To reach me or the doctor on call, go to: [email or web address] Password [email or web address]  03/02/2021, 10:03 AM

## 2021-03-02 NOTE — Progress Notes (Signed)
Subjective: Day 1 s/p L knee aspiration. States his knee feels much better. Still has not regained full motion but pain improved. No fever or chills. Has not been given ice for the knee.   Objective: Vital signs in last 24 hours: Temp:  [97.8 F (36.6 C)-98.4 F (36.9 C)] 98.4 F (36.9 C) (07/10 0523) Pulse Rate:  [57-81] 62 (07/10 0523) Resp:  [14-18] 18 (07/10 0523) BP: (107-118)/(57-67) 118/67 (07/10 0523) SpO2:  [96 %-100 %] 97 % (07/10 0523) Weight:  [86.5 kg] 86.5 kg (07/10 0523)  Intake/Output from previous day: 07/09 0701 - 07/10 0700 In: 1424.3 [P.O.:720; IV Piggyback:704.3] Out: 500 [Urine:500] Intake/Output this shift: No intake/output data recorded.  Recent Labs    02/28/21 0817 03/01/21 0257 03/02/21 0313  HGB 9.7* 8.8* 8.8*   Recent Labs    03/01/21 0257 03/02/21 0313  WBC 8.8 7.6  RBC 2.63* 2.62*  HCT 25.8* 25.7*  PLT 215 232   Recent Labs    02/28/21 0817 03/01/21 0257  NA 130* 131*  K 4.2 3.7  CL 98 97*  CO2 24 24  BUN 45* 45*  CREATININE 2.73* 2.68*  GLUCOSE 121* 114*  CALCIUM 9.4 9.1   No results for input(s): LABPT, INR in the last 72 hours.  Neurologically intact ABD soft Neurovascular intact Sensation intact distally Intact pulses distally Dorsiflexion/Plantar flexion intact No cellulitis present Compartment soft Left knee mildly tender medial and lateral joint line and at aspiration site. Trace effusion. No erythema, ecchymosis, increased warmth of any sign of infx. No calf pain or sign of DVT    Assessment/Plan: Awaiting cultures of knee aspirate but not suspicious of any infection No crystals seen Will order ice for the left knee If cultures are in fact negative as expected and having ongoing pain could consider cortisone injection Seen by myself and Dr Tonita Cong in rounds   Cecilie Kicks PA-C 03/02/2021, 8:52 AM

## 2021-03-03 DIAGNOSIS — Z8673 Personal history of transient ischemic attack (TIA), and cerebral infarction without residual deficits: Secondary | ICD-10-CM

## 2021-03-03 DIAGNOSIS — N184 Chronic kidney disease, stage 4 (severe): Secondary | ICD-10-CM | POA: Diagnosis not present

## 2021-03-03 DIAGNOSIS — R7881 Bacteremia: Secondary | ICD-10-CM | POA: Diagnosis not present

## 2021-03-03 DIAGNOSIS — M25462 Effusion, left knee: Secondary | ICD-10-CM | POA: Diagnosis not present

## 2021-03-03 LAB — CBC WITH DIFFERENTIAL/PLATELET
Abs Immature Granulocytes: 0.27 10*3/uL — ABNORMAL HIGH (ref 0.00–0.07)
Basophils Absolute: 0.1 10*3/uL (ref 0.0–0.1)
Basophils Relative: 1 %
Eosinophils Absolute: 0.4 10*3/uL (ref 0.0–0.5)
Eosinophils Relative: 4 %
HCT: 29.3 % — ABNORMAL LOW (ref 39.0–52.0)
Hemoglobin: 9.9 g/dL — ABNORMAL LOW (ref 13.0–17.0)
Immature Granulocytes: 3 %
Lymphocytes Relative: 9 %
Lymphs Abs: 0.8 10*3/uL (ref 0.7–4.0)
MCH: 33.4 pg (ref 26.0–34.0)
MCHC: 33.8 g/dL (ref 30.0–36.0)
MCV: 99 fL (ref 80.0–100.0)
Monocytes Absolute: 0.8 10*3/uL (ref 0.1–1.0)
Monocytes Relative: 9 %
Neutro Abs: 6.4 10*3/uL (ref 1.7–7.7)
Neutrophils Relative %: 74 %
Platelets: 280 10*3/uL (ref 150–400)
RBC: 2.96 MIL/uL — ABNORMAL LOW (ref 4.22–5.81)
RDW: 13.9 % (ref 11.5–15.5)
WBC: 8.6 10*3/uL (ref 4.0–10.5)
nRBC: 0 % (ref 0.0–0.2)

## 2021-03-03 LAB — GLUCOSE, CAPILLARY
Glucose-Capillary: 108 mg/dL — ABNORMAL HIGH (ref 70–99)
Glucose-Capillary: 105 mg/dL — ABNORMAL HIGH (ref 70–99)
Glucose-Capillary: 151 mg/dL — ABNORMAL HIGH (ref 70–99)
Glucose-Capillary: 153 mg/dL — ABNORMAL HIGH (ref 70–99)
Glucose-Capillary: 208 mg/dL — ABNORMAL HIGH (ref 70–99)

## 2021-03-03 NOTE — Care Management Important Message (Signed)
Important Message  Patient Details IM Letter given to the Patient. Name: [name] MRN: [id number] Date of Birth: 1945/12/18   Medicare Important Message Given:  Yes     Kerin Salen 03/03/2021, 10:04 AM

## 2021-03-03 NOTE — Progress Notes (Signed)
Subjective:   Procedure(s) (LRB): TRANSESOPHAGEAL ECHOCARDIOGRAM (TEE) (N/A)  Patient reports pain as mild to moderate.  Reports that his L knee feels much better following the aspiration yesterday.  Notes that he was able to get out of bed and move to the chair.  Tolerating POs well.  Admits to flatus.  Objective:   VITALS:  Temp:  [98.2 F (36.8 C)-98.6 F (37 C)] 98.2 F (36.8 C) (07/11 0528) Pulse Rate:  [66-85] 66 (07/11 0528) Resp:  [18] 18 (07/10 2256) BP: (115-132)/(63-66) 132/63 (07/11 0528) SpO2:  [96 %-100 %] 100 % (07/11 0528) Weight:  [87.7 kg] 87.7 kg (07/11 0500)  General: WDWN patient in NAD. Psych:  Appropriate mood and affect. Neuro:  A&O x 3, Moving all extremities, sensation intact to light touch HEENT:  EOMs intact Chest:  Even non-labored respirations Skin: C/D/I, no rashes or lesions Extremities: warm/dry, mild edema to L knee, no erythema or echymosis.  No lymphadenopathy. Pulses: Popliteus 2+ MSK:  ROM: TKE, MMT: able to perform quad set, (-) Homan's    LABS Recent Labs    02/28/21 0817 03/01/21 0257 03/02/21 0313  HGB 9.7* 8.8* 8.8*  WBC 10.3 8.8 7.6  PLT 196 215 232   Recent Labs    02/28/21 0817 03/01/21 0257  NA 130* 131*  K 4.2 3.7  CL 98 97*  CO2 24 24  BUN 45* 45*  CREATININE 2.73* 2.68*  GLUCOSE 121* 114*   No results for input(s): LABPT, INR in the last 72 hours.   Assessment/Plan:   Procedure(s) (LRB): TRANSESOPHAGEAL ECHOCARDIOGRAM (TEE) (N/A)  L knee effusion  WBAT L LE No organisms seen on cultures thus far Clinical presentation most consistent with reactive effusion Plan for outpatient follow up with Dr. Wynelle Link for patient's chronic L knee OA.   Mechele Claude PA-C [facility] Office:  [phone]

## 2021-03-03 NOTE — Progress Notes (Signed)
[facility] for Infectious Disease    Date of Admission:  02/26/2021   Total days of antibiotics 6   ID: [name] is a 75 y.o. 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 Still have some back pain thought to be due to sleeping in bed. Laying in chair.  Medications:   amiodarone  200 mg Oral Daily   apixaban  5 mg Oral BID   calcitRIOL  0.25 mcg Oral QODAY   chlorhexidine  15 mL Mouth Rinse BID   insulin aspart  0-5 Units Subcutaneous QHS   insulin aspart  0-9 Units Subcutaneous TID WC   lidocaine  1 patch Transdermal Q24H   loratadine  10 mg Oral Daily   mouth rinse  15 mL Mouth Rinse q12n4p   traZODone  100 mg Oral QHS    Objective: Vital signs in last 24 hours: Temp:  [98.2 F (36.8 C)-98.7 F (37.1 C)] 98.7 F (37.1 C) (07/11 1300) Pulse Rate:  [58-85] 58 (07/11 1300) Resp:  [18] 18 (07/10 2256) BP: (118-132)/(63-67) 118/67 (07/11 1300) SpO2:  [96 %-100 %] 98 % (07/11 1300) Weight:  [87.7 kg] 87.7 kg (07/11 0500)  Physical Exam  Constitutional: He is oriented to person, place, and time. He appears well-developed and well-nourished. No distress.  HENT:  Mouth/Throat: Oropharynx is clear and moist. No oropharyngeal exudate.  Cardiovascular: Normal rate, regular rhythm and normal heart sounds. Exam reveals no gallop and no friction rub.  No murmur heard.  Pulmonary/Chest: Effort normal and breath sounds  normal. No respiratory distress. He has no wheezes.  Abdominal: Soft. Bowel sounds are normal. He exhibits no distension. There is no tenderness.  Ext:ice pack to left knee. Scattered abrasions and ecchymosis from fall Neurological: He is alert and oriented to person, place, and time.  Skin: Skin is warm and dry. No rash noted. No erythema.  Psychiatric: He has a normal mood and affect. His behavior is normal.    Lab Results Recent Labs    03/01/21 0257 03/02/21 0313 03/03/21 0847  WBC 8.8 7.6 8.6  HGB 8.8* 8.8* 9.9*  HCT 25.8* 25.7* 29.3*  NA 131*  --   --   K 3.7  --   --   CL 97*  --   --   CO2 24  --   --   BUN 45*  --   --   CREATININE 2.68*  --   --    No results found for: [place]  Microbiology: 7/8 blood cx ngtd 7/9 knee aspirate ngtd Studies/Results: No results found.   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 Await TEE for final recommendations  Ckd 4 = continue on renally dosed ampicillin.  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 Suspect trauma of falling on his knee caused his effusion. Continue to provide supportive  care  [facility] Cell: [phone] Pager: [phone]  03/03/2021, 5:58 PM

## 2021-03-03 NOTE — H&P (View-Only) (Signed)
[facility]    Date of Admission:  02/26/2021   Total days of antibiotics 6   ID: [name] is a 75 y.o. 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 Still have some back pain thought to be due to sleeping in bed. Laying in chair.  Medications:   amiodarone  200 mg Oral Daily   apixaban  5 mg Oral BID   calcitRIOL  0.25 mcg Oral QODAY   chlorhexidine  15 mL Mouth Rinse BID   insulin aspart  0-5 Units Subcutaneous QHS   insulin aspart  0-9 Units Subcutaneous TID WC   lidocaine  1 patch Transdermal Q24H   loratadine  10 mg Oral Daily   mouth rinse  15 mL Mouth Rinse q12n4p   traZODone  100 mg Oral QHS    Objective: Vital signs in last 24 hours: Temp:  [98.2 F (36.8 C)-98.7 F (37.1 C)] 98.7 F (37.1 C) (07/11 1300) Pulse Rate:  [58-85] 58 (07/11 1300) Resp:  [18] 18 (07/10 2256) BP: (118-132)/(63-67) 118/67 (07/11 1300) SpO2:  [96 %-100 %] 98 % (07/11 1300) Weight:  [87.7 kg] 87.7 kg (07/11 0500)  Physical Exam  Constitutional: He is oriented to person, place, and time. He appears well-developed and well-nourished. No distress.  HENT:  Mouth/Throat: Oropharynx is clear and moist. No oropharyngeal exudate.  Cardiovascular: Normal rate, regular rhythm and normal heart sounds. Exam reveals no gallop and no friction rub.  No murmur heard.  Pulmonary/Chest: Effort normal and breath sounds  normal. No respiratory distress. He has no wheezes.  Abdominal: Soft. Bowel sounds are normal. He exhibits no distension. There is no tenderness.  Ext:ice pack to left knee. Scattered abrasions and ecchymosis from fall Neurological: He is alert and oriented to person, place, and time.  Skin: Skin is warm and dry. No rash noted. No erythema.  Psychiatric: He has a normal mood and affect. His behavior is normal.    Lab Results Recent Labs    03/01/21 0257 03/02/21 0313 03/03/21 0847  WBC 8.8 7.6 8.6  HGB 8.8* 8.8* 9.9*  HCT 25.8* 25.7* 29.3*  NA 131*  --   --   K 3.7  --   --   CL 97*  --   --   CO2 24  --   --   BUN 45*  --   --   CREATININE 2.68*  --   --    No results found for: [place]  Microbiology: 7/8 blood cx ngtd 7/9 knee aspirate ngtd Studies/Results: No results found.   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 Await TEE for final recommendations  Ckd 4 = continue on renally dosed ampicillin.  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 Suspect trauma of falling on his knee caused his effusion. Continue to provide supportive  care  [facility] Cell: [phone] Pager: [phone]  03/03/2021, 5:58 PM

## 2021-03-03 NOTE — Progress Notes (Signed)
Occupational Therapy Treatment Patient Details Name: [name] MRN: [id number] DOB: 04/02/1946 Today's Date: 03/03/2021    History of present illness Patient is a 75 y.o. male admitted for anemia. Past medical history significant for atrial fibrillation, CAD, chronic kidney disease stage IV, history of stroke last month, diabetes mellitus type 2, HFpEF, mitral valve repair 5 years ago, gout, OA   OT comments  Patient is a pleasant 75 year old male who was lying in bed at start of session with min A for supine to sit with assistance for trunk coming into upright positioning. Patient was noted to have had a left knee aspiration on 7/10. Patient reported having less pain and more mobility on this date.Patient was mod A for initial sit to stand from edge of bed with rolling walker with 2 attempts to come into upright standing. Patient was able to improve transfers during session to min A and then to min guard from various surfaces in room with rolling walker and continued education on proper hand placement for transfers. Patient completed functional activity tolerance challenges in standing around room with rolling walker to increase independence in ADL tasks. Patient reported that he has family at home that can assist with mobility if needed. Patient continues to make progress towards goals at this time. OT will continue to follow acutely.   Follow Up Recommendations  No OT follow up    Equipment Recommendations  None recommended by OT    Recommendations for Other Services      Precautions / Restrictions Precautions Precautions: Fall Restrictions Weight Bearing Restrictions: No       Mobility Bed Mobility Overal bed mobility: Needs Assistance Bed Mobility: Supine to Sit     Supine to sit: HOB elevated;Min assist     General bed mobility comments: patinet needed assistance with getting trunk into upright positioning.    Transfers Overall transfer level: Needs  assistance Equipment used: Rolling walker (2 wheeled) Transfers: Sit to/from Stand Sit to Stand: Mod assist Stand pivot transfers: Min assist       General transfer comment: increased time and effort, mod for sit to stand initally from bed with 2 attempts made. patient was able to complete transfers from sit to stand with min/min guard later in session.  for safety, pt requested to stand for a few minutes due to pain    Balance Overall balance assessment: History of Falls;Needs assistance Sitting-balance support: No upper extremity supported;Feet supported Sitting balance-Leahy Scale: Good     Standing balance support: During functional activity Standing balance-Leahy Scale: Fair Standing balance comment: with rolling walker present                           ADL either performed or assessed with clinical judgement   ADL Overall ADL's : Needs assistance/impaired     Grooming: Set up;Sitting                               Functional mobility during ADLs: Min guard;Rolling walker General ADL Comments: in room to trial toileting tasks.     Vision       Perception     Praxis      Cognition Arousal/Alertness: Awake/alert Behavior During Therapy: WFL for tasks assessed/performed Overall Cognitive Status: Within Functional Limits for tasks assessed  Exercises     Shoulder Instructions       General Comments      Pertinent Vitals/ Pain       Pain Assessment: Faces Faces Pain Scale: Hurts even more Pain Location: left knee and low back Pain Descriptors / Indicators: Aching;Grimacing;Guarding;Sharp Pain Intervention(s): Monitored during session;Limited activity within patient's tolerance  Home Living                                          Prior Functioning/Environment              Frequency  Min 2X/week        Progress Toward Goals  OT Goals(current  goals can now be found in the care plan section)  Progress towards OT goals: Progressing toward goals  Acute Rehab OT Goals Patient Stated Goal: walk without falling OT Goal Formulation: With patient Time For Goal Achievement: 03/13/21 Potential to Achieve Goals: Good  Plan Discharge plan remains appropriate    Co-evaluation                 AM-PAC OT "6 Clicks" Daily Activity     Outcome Measure   Help from another person eating meals?: None Help from another person taking care of personal grooming?: None Help from another person toileting, which includes using toliet, bedpan, or urinal?: A Little Help from another person bathing (including washing, rinsing, drying)?: A Little Help from another person to put on and taking off regular upper body clothing?: A Little Help from another person to put on and taking off regular lower body clothing?: A Lot 6 Click Score: 19    End of Session Equipment Utilized During Treatment: Rolling walker;Gait belt  OT Visit Diagnosis: Other abnormalities of gait and mobility (R26.89);Repeated falls (R29.6);Pain Pain - Right/Left: Left Pain - part of body: Knee   Activity Tolerance Patient tolerated treatment well   Patient Left in chair;with call bell/phone within reach   Nurse Communication Other (comment)        Time: 8101-7510 OT Time Calculation (min): 36 min  Charges: OT General Charges $OT Visit: 1 Visit OT Treatments $Therapeutic Activity: 23-37 mins  Jackelyn Poling OTR/L, [place] Acute Rehabilitation Department Office# [phone] Pager# [phone]    [place] 03/03/2021, 4:54 PM

## 2021-03-03 NOTE — Progress Notes (Addendum)
Progress Note    RAVIN DENARDO  PZW:[id number] DOB: 03-26-1946  DOA: 02/26/2021 PCP: [name], MD      Brief Narrative:    Medical records reviewed and are as summarized below:  [name] is a 75 y.o. male  with medical history significant for atrial fibrillation, CAD, chronic kidney disease stage IV, history of stroke last month, diabetes mellitus type 2, HFpEF, mitral valve repair 5 years ago, gout, osteoarthritis, who presented to the hospital because of generalized weakness, frequent falls at home and fever.  He said symptoms have been going on for about a week now.  He had multiple falls at home that caused him to have a lot of bruises on his back, hip and lower extremities.  He also has generalized body pain from his fall.  He was found to have acute hyponatremia and he was given IV fluids for this.  Blood culture also revealed Enterococcus faecalis so he was started on empiric IV antibiotics.      Assessment/Plan:   Principal Problem:   Anemia Active Problems:   Type 2 diabetes mellitus without complications (HCC)   Essential hypertension   CAD, AUTOLOGOUS BYPASS GRAFT   Diastolic CHF, chronic (HCC)   Hyponatremia   Weakness generalized   CKD (chronic kidney disease) stage 4, GFR 15-29 ml/min (HCC)   Atrial fibrillation, chronic (HCC)   Chronic anticoagulation   Fever   Prolonged QT interval   Bacteremia due to Gram-positive bacteria   Effusion of left knee joint    Body mass index is 27.74 kg/m.  Acute nonhemorrhagic stroke in posterior left lentiform nucleus, history of stroke, CAD: Continue Eliquis.  He does not want to resume Aspirin.  Acute hyponatremia: Improving.  Continue to monitor.    Fever at home, Enterococcus faecalis bacteremia: Continue IV ampicillin and ceftriaxone.  No evidence of osteomyelitis or discitis on MRI lumbar spine.  Plan for TEE tomorrow.  Case was discussed with Dr. Debara Pickett, from cardiology.  He reviewed the  chart and he said hemoglobin is okay for TEE tomorrow.  Follow-up with cardiologist and ID.  Left knee osteoarthritis, left knee joint effusion: No evidence of infection in the left knee thus far.  Outpatient follow-up with Dr. Wynelle Link.  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  Analgesics as needed for pain.   Chronic diastolic CHF: Compensated.  CKD stage IV: Creatinine is stable  Chronic atrial fibrillation: Continue amiodarone and Eliquis.  Prolonged QT interval (569), RBBB and LAFB on EKG from 02/26/2021: Repeat EKG on 02/27/2021 showed LAD, LBBB, prolonged QTC (503).  Anemia of chronic disease: Hemoglobin is stable.  Stool for occult blood was negative.  Mildly elevated but flat troponins: This is likely from demand ischemia.  Type II DM: NovoLog as needed for hyperglycemia.  Generalized weakness, multiple falls at home: Outpatient PT was recommended.    Diet Order             Diet NPO time specified Except for: Sips with Meds  Diet effective midnight           Diet Heart Room service appropriate? Yes; Fluid consistency: Thin  Diet effective now                      Consultants: Infectious disease Cardiologist for TEE  Procedures: None    Medications:  amiodarone  200 mg Oral Daily   apixaban  5 mg Oral BID   calcitRIOL  0.25 mcg Oral QODAY   chlorhexidine  15 mL Mouth Rinse BID   insulin aspart  0-5 Units Subcutaneous QHS   insulin aspart  0-9 Units Subcutaneous TID WC   lidocaine  1 patch Transdermal Q24H   loratadine  10 mg Oral Daily   mouth rinse  15 mL Mouth Rinse q12n4p   traZODone  100 mg Oral QHS   Continuous Infusions:  sodium chloride Stopped (02/27/21 1600)   ampicillin (OMNIPEN) IV 2 g (03/03/21 1221)   cefTRIAXone (ROCEPHIN)  IV 2 g (03/03/21 1052)     Anti-infectives (From admission,  onward)    Start     Dose/Rate Route Frequency Ordered Stop   02/27/21 1430  cefTRIAXone (ROCEPHIN) 2 g in sodium chloride 0.9 % 100 mL IVPB        2 g 200 mL/hr over 30 Minutes Intravenous Every 12 hours 02/27/21 1339     02/26/21 2000  ampicillin (OMNIPEN) 2 g in sodium chloride 0.9 % 100 mL IVPB        2 g 300 mL/hr over 20 Minutes Intravenous Every 8 hours 02/26/21 1915                Family Communication/Anticipated D/C date and plan/Code Status   DVT prophylaxis:  apixaban (ELIQUIS) tablet 5 mg     Code Status: Full Code  Family Communication: His wife over the phone Disposition Plan:    Status is: Inpatient  Remains inpatient appropriate because:IV treatments appropriate due to intensity of illness or inability to take PO  Dispo: The patient is from: Home              Anticipated d/c is to: Home              Patient currently is not medically stable to d/c.   Difficult to place patient No           Subjective:   Interval events noted.  He feels better today.  Pain in the left knee is better.  No fever or chills.  Objective:    Vitals:   03/02/21 2256 03/03/21 0500 03/03/21 0528 03/03/21 1300  BP:   132/63 118/67  Pulse: 85  66 (!) 58  Resp: 18     Temp:   98.2 F (36.8 C) 98.7 F (37.1 C)  TempSrc:   Oral Oral  SpO2: 96%  100% 98%  Weight:  87.7 kg    Height:       No data found.   Intake/Output Summary (Last 24 hours) at 03/03/2021 1621 Last data filed at 03/03/2021 0529 Gross per 24 hour  Intake 500 ml  Output 1000 ml  Net -500 ml   Filed Weights   03/01/21 0156 03/02/21 0523 03/03/21 0500  Weight: 87.6 kg 86.5 kg 87.7 kg    Exam:   GEN: NAD SKIN:.  Bruises on the right thigh and right leg.  Abrasion on right leg. EYES: No pallor or icterus ENT: MMM CV: RRR PULM: CTA B ABD: soft, ND, NT, +BS CNS: AAO x 3, non focal EXT: No edema or tenderness MSK: Mild swelling and tenderness of the left knee.  No  erythema        Data Reviewed:   I have personally reviewed following labs and imaging studies:  Labs: Labs show the following:   Basic Metabolic Panel: Recent Labs  Lab 02/26/21  0236 02/27/21 0337 02/28/21 0817 03/01/21 0257  NA 127* 132* 130* 131*  K 3.8 4.2 4.2 3.7  CL 96* 100 98 97*  CO2 23 24 24 24   GLUCOSE 178* 153* 121* 114*  BUN 52* 45* 45* 45*  CREATININE 3.17* 2.52* 2.73* 2.68*  CALCIUM 8.8* 9.0 9.4 9.1   GFR Estimated Creatinine Clearance: 26.6 mL/min (A) (by C-G formula based on SCr of 2.68 mg/dL (H)). Liver Function Tests: Recent Labs  Lab 02/26/21 0236  AST 36  ALT 36  ALKPHOS 94  BILITOT 1.1  PROT 5.2*  ALBUMIN 2.8*   No results for input(s): LIPASE, AMYLASE in the last 168 hours. No results for input(s): AMMONIA in the last 168 hours. Coagulation profile No results for input(s): INR, PROTIME in the last 168 hours.  CBC: Recent Labs  Lab 02/26/21 0236 02/27/21 0337 02/28/21 0817 03/01/21 0257 03/02/21 0313 03/03/21 0847  WBC 9.2 12.8* 10.3 8.8 7.6 8.6  NEUTROABS 7.7  --  7.7 6.6 5.5 6.4  HGB 8.9* 9.0* 9.7* 8.8* 8.8* 9.9*  HCT 25.8* 26.4* 28.1* 25.8* 25.7* 29.3*  MCV 98.5 98.5 96.9 98.1 98.1 99.0  PLT 164 164 196 215 232 280   Cardiac Enzymes: No results for input(s): CKTOTAL, CKMB, CKMBINDEX, TROPONINI in the last 168 hours. BNP (last 3 results) No results for input(s): PROBNP in the last 8760 hours. CBG: Recent Labs  Lab 03/02/21 1805 03/02/21 2105 03/03/21 0534 03/03/21 0749 03/03/21 1211  GLUCAP 170* 119* 108* 105* 151*   D-Dimer: No results for input(s): DDIMER in the last 72 hours. Hgb A1c: No results for input(s): HGBA1C in the last 72 hours. Lipid Profile: No results for input(s): CHOL, HDL, LDLCALC, TRIG, CHOLHDL, LDLDIRECT in the last 72 hours. Thyroid function studies: No results for input(s): TSH, T4TOTAL, T3FREE, THYROIDAB in the last 72 hours.  Invalid input(s): FREET3 Anemia work up: No results for  input(s): VITAMINB12, FOLATE, FERRITIN, TIBC, IRON, RETICCTPCT in the last 72 hours. Sepsis Labs: Recent Labs  Lab 02/26/21 0236 02/27/21 0337 02/28/21 0817 03/01/21 0257 03/02/21 0313 03/03/21 0847  WBC 9.2   < > 10.3 8.8 7.6 8.6  LATICACIDVEN 0.8  --   --   --   --   --    < > = values in this interval not displayed.    Microbiology Recent Results (from the past 240 hour(s))  Resp Panel by RT-PCR (Flu A&B, Covid) Nasopharyngeal Swab     Status: None   Collection Time: 02/26/21  2:36 AM   Specimen: Nasopharyngeal Swab; Nasopharyngeal(NP) swabs in vial transport medium  Result Value Ref Range Status   SARS Coronavirus 2 by RT PCR NEGATIVE NEGATIVE Final    Comment: (NOTE) SARS-CoV-2 target nucleic acids are NOT DETECTED.  The SARS-CoV-2 RNA is generally detectable in upper respiratory specimens during the acute phase of infection. The lowest concentration of SARS-CoV-2 viral copies this assay can detect is 138 copies/mL. A negative result does not preclude SARS-Cov-2 infection and should not be used as the sole basis for treatment or other patient management decisions. A negative result may occur with  improper specimen collection/handling, submission of specimen other than nasopharyngeal swab, presence of viral mutation(s) within the areas targeted by this assay, and inadequate number of viral copies(<138 copies/mL). A negative result must be combined with clinical observations, patient history, and epidemiological information. The expected result is Negative.  Fact Sheet for Patients:  EntrepreneurPulse.com.au  Fact Sheet for Healthcare Providers:  IncredibleEmployment.be  This test is  no t yet approved or cleared by the [place] and  has been authorized for detection and/or diagnosis of SARS-CoV-2 by FDA under an Emergency Use Authorization (EUA). This EUA will remain  in effect (meaning this test can be used) for the  duration of the COVID-19 declaration under Section 564(b)(1) of the Act, 21 U.S.C.section 360bbb-3(b)(1), unless the authorization is terminated  or revoked sooner.       Influenza A by PCR NEGATIVE NEGATIVE Final   Influenza B by PCR NEGATIVE NEGATIVE Final    Comment: (NOTE) The Xpert Xpress SARS-CoV-2/FLU/RSV plus assay is intended as an aid in the diagnosis of influenza from Nasopharyngeal swab specimens and should not be used as a sole basis for treatment. Nasal washings and aspirates are unacceptable for Xpert Xpress SARS-CoV-2/FLU/RSV testing.  Fact Sheet for Patients: EntrepreneurPulse.com.au  Fact Sheet for Healthcare Providers: IncredibleEmployment.be  This test is not yet approved or cleared by the [place] FDA and has been authorized for detection and/or diagnosis of SARS-CoV-2 by FDA under an Emergency Use Authorization (EUA). This EUA will remain in effect (meaning this test can be used) for the duration of the COVID-19 declaration under Section 564(b)(1) of the Act, 21 U.S.C. section 360bbb-3(b)(1), unless the authorization is terminated or revoked.  Performed at [facility], [place] [address] [place], [place] [address]   Blood Culture (routine x 2)     Status: Abnormal   Collection Time: 02/26/21  2:36 AM   Specimen: BLOOD  Result Value Ref Range Status   Specimen Description   Final    BLOOD SPECIMEN SOURCE NOT MARKED ON REQUISITION Performed at [facility] [address] [place], [place] [address]    Special Requests   Final    BOTTLES DRAWN AEROBIC AND ANAEROBIC Blood Culture adequate volume Performed at [facility] [address] [place], [place] [address]    Culture  Setup Time   Final    GRAM POSITIVE COCCI IN CHAINS IN BOTH AEROBIC AND ANAEROBIC BOTTLES Organism ID to follow CRITICAL RESULT CALLED TO, READ BACK BY AND VERIFIED WITHCristopher Estimable  PHARMD 1902 02/26/21 A BROWNING Performed at [facility], [place] [address], [place], [place] [address]    Culture ENTEROCOCCUS FAECALIS (A)  Final   Report Status 02/28/2021 FINAL  Final   Organism ID, Bacteria ENTEROCOCCUS FAECALIS  Final      Susceptibility   Enterococcus faecalis - MIC*    AMPICILLIN <=2 SENSITIVE Sensitive     VANCOMYCIN 1 SENSITIVE Sensitive     GENTAMICIN SYNERGY SENSITIVE Sensitive     * ENTEROCOCCUS FAECALIS  Urine culture     Status: Abnormal   Collection Time: 02/26/21  2:36 AM   Specimen: In/Out Cath Urine  Result Value Ref Range Status   Specimen Description   Final    IN/OUT CATH URINE Performed at [facility] [address], [place], [place] [address]    Special Requests   Final    NONE Performed at [facility], [place] [address], [place],  [address]    Culture (A)  Final    <10,000 COLONIES/mL INSIGNIFICANT GROWTH Performed at [facility] [address] [place],  [address]    Report Status 02/27/2021 FINAL  Final  Blood Culture ID Panel (Reflexed)     Status: Abnormal   Collection Time: 02/26/21  2:36 AM  Result Value Ref Range Status   Enterococcus faecalis  DETECTED (A) NOT DETECTED Final    Comment: CRITICAL RESULT CALLED TO, READ BACK BY AND VERIFIED WITH: E WILLIAMSON PHARMD 1902 02/26/21 A BROWNING    Enterococcus Faecium NOT DETECTED NOT DETECTED Final   Listeria monocytogenes NOT DETECTED NOT DETECTED Final   Staphylococcus species NOT DETECTED NOT DETECTED Final   Staphylococcus aureus (BCID) NOT DETECTED NOT DETECTED Final   Staphylococcus epidermidis NOT DETECTED NOT DETECTED Final   Staphylococcus lugdunensis NOT DETECTED NOT DETECTED Final   Streptococcus species NOT DETECTED NOT DETECTED Final   Streptococcus agalactiae NOT DETECTED NOT DETECTED Final   Streptococcus pneumoniae NOT DETECTED NOT DETECTED Final   Streptococcus pyogenes NOT DETECTED NOT DETECTED Final    A.calcoaceticus-baumannii NOT DETECTED NOT DETECTED Final   Bacteroides fragilis NOT DETECTED NOT DETECTED Final   Enterobacterales NOT DETECTED NOT DETECTED Final   Enterobacter cloacae complex NOT DETECTED NOT DETECTED Final   Escherichia coli NOT DETECTED NOT DETECTED Final   Klebsiella aerogenes NOT DETECTED NOT DETECTED Final   Klebsiella oxytoca NOT DETECTED NOT DETECTED Final   Klebsiella pneumoniae NOT DETECTED NOT DETECTED Final   Proteus species NOT DETECTED NOT DETECTED Final   Salmonella species NOT DETECTED NOT DETECTED Final   Serratia marcescens NOT DETECTED NOT DETECTED Final   Haemophilus influenzae NOT DETECTED NOT DETECTED Final   Neisseria meningitidis NOT DETECTED NOT DETECTED Final   Pseudomonas aeruginosa NOT DETECTED NOT DETECTED Final   Stenotrophomonas maltophilia NOT DETECTED NOT DETECTED Final   Candida albicans NOT DETECTED NOT DETECTED Final   Candida auris NOT DETECTED NOT DETECTED Final   Candida glabrata NOT DETECTED NOT DETECTED Final   Candida krusei NOT DETECTED NOT DETECTED Final   Candida parapsilosis NOT DETECTED NOT DETECTED Final   Candida tropicalis NOT DETECTED NOT DETECTED Final   Cryptococcus neoformans/gattii NOT DETECTED NOT DETECTED Final   Vancomycin resistance NOT DETECTED NOT DETECTED Final    Comment: Performed at [facility], [address], [place], [place] [address]  Culture, blood (Routine X 2) w Reflex to ID Panel     Status: None (Preliminary result)   Collection Time: 02/28/21  4:11 PM   Specimen: BLOOD  Result Value Ref Range Status   Specimen Description   Final    BLOOD RIGHT ANTECUBITAL Performed at [facility] [address] [place], [place] [address]    Special Requests   Final    BOTTLES DRAWN AEROBIC AND ANAEROBIC Blood Culture adequate volume Performed at [facility] [address], [place], [place] [address]    Culture   Final    NO GROWTH 3 DAYS Performed at  [facility], [place] [address], [place], [place] [address]    Report Status PENDING  Incomplete  Culture, blood (Routine X 2) w Reflex to ID Panel     Status: None (Preliminary result)   Collection Time: 02/28/21  4:22 PM   Specimen: BLOOD RIGHT HAND  Result Value Ref Range Status   Specimen Description   Final    BLOOD RIGHT HAND Performed at [facility] [address], [place], [place] [address]    Special Requests   Final    BOTTLES DRAWN AEROBIC AND ANAEROBIC Blood Culture adequate volume Performed at [facility] [address], [place], [place] [address]    Culture   Final    NO GROWTH 3 DAYS Performed at [facility], [place] [address] [place],  [address]  Report Status PENDING  Incomplete  Body fluid culture w Gram Stain     Status: None (Preliminary result)   Collection Time: 03/01/21  6:31 PM   Specimen: Synovium; Body Fluid  Result Value Ref Range Status   Specimen Description   Final    SYNOVIAL LEFT KNEE Performed at [facility] [address], [place], [place] [address]    Special Requests   Final    Normal Performed at [facility], [place] [address], [place], [place] [address]    Gram Stain   Final    ABUNDANT WBC PRESENT, PREDOMINANTLY PMN NO ORGANISMS SEEN    Culture   Final    NO GROWTH 2 DAYS Performed at [facility], [place] [address] [place], [place] [address]    Report Status PENDING  Incomplete    Procedures and diagnostic studies:  No results found.             LOS: 5 days   Costantino Kohlbeck  [facility]   Pager on [email or web address]. If 7PM-7AM, please contact night-coverage at [email or web address]     03/03/2021, 4:21 PM

## 2021-03-04 ENCOUNTER — Inpatient Hospital Stay (HOSPITAL_COMMUNITY): Payer: No Typology Code available for payment source

## 2021-03-04 ENCOUNTER — Encounter (HOSPITAL_COMMUNITY)
Admission: EM | Disposition: A | Discharge status: Home Health | Payer: Self-pay | Source: Home / Self Care | Attending: Internal Medicine

## 2021-03-04 ENCOUNTER — Encounter (HOSPITAL_COMMUNITY): Payer: Self-pay | Admitting: Family Medicine

## 2021-03-04 ENCOUNTER — Inpatient Hospital Stay (HOSPITAL_COMMUNITY): Payer: No Typology Code available for payment source | Admitting: Anesthesiology

## 2021-03-04 DIAGNOSIS — R7881 Bacteremia: Secondary | ICD-10-CM

## 2021-03-04 DIAGNOSIS — N184 Chronic kidney disease, stage 4 (severe): Secondary | ICD-10-CM | POA: Diagnosis not present

## 2021-03-04 DIAGNOSIS — I38 Endocarditis, valve unspecified: Secondary | ICD-10-CM

## 2021-03-04 DIAGNOSIS — T826XXA Infection and inflammatory reaction due to cardiac valve prosthesis, initial encounter: Principal | ICD-10-CM

## 2021-03-04 DIAGNOSIS — Z952 Presence of prosthetic heart valve: Secondary | ICD-10-CM

## 2021-03-04 DIAGNOSIS — M25462 Effusion, left knee: Secondary | ICD-10-CM | POA: Diagnosis not present

## 2021-03-04 DIAGNOSIS — I082 Rheumatic disorders of both aortic and tricuspid valves: Secondary | ICD-10-CM

## 2021-03-04 HISTORY — PX: TEE WITHOUT CARDIOVERSION: SHX5443

## 2021-03-04 LAB — GLUCOSE, CAPILLARY
Glucose-Capillary: 99 mg/dL (ref 70–99)
Glucose-Capillary: 121 mg/dL — ABNORMAL HIGH (ref 70–99)
Glucose-Capillary: 118 mg/dL — ABNORMAL HIGH (ref 70–99)
Glucose-Capillary: 131 mg/dL — ABNORMAL HIGH (ref 70–99)
Glucose-Capillary: 207 mg/dL — ABNORMAL HIGH (ref 70–99)

## 2021-03-04 LAB — ECHO TEE: Area-P 1/2: 2.1 cm2

## 2021-03-04 SURGERY — ECHOCARDIOGRAM, TRANSESOPHAGEAL
Anesthesia: Monitor Anesthesia Care

## 2021-03-04 MED ORDER — PROPOFOL 10 MG/ML IV BOLUS
INTRAVENOUS | Status: DC | PRN
  Administered 2021-03-04: 20 mg via INTRAVENOUS

## 2021-03-04 MED ORDER — SODIUM CHLORIDE 0.9 % IV SOLN: INTRAVENOUS | Status: DC

## 2021-03-04 MED ORDER — LIDOCAINE 2% (20 MG/ML) 5 ML SYRINGE
INTRAMUSCULAR | Status: DC | PRN
  Administered 2021-03-04: 40 mg via INTRAVENOUS

## 2021-03-04 MED ORDER — PROPOFOL 500 MG/50ML IV EMUL
INTRAVENOUS | Status: DC | PRN
  Administered 2021-03-04: 125 ug/kg/min via INTRAVENOUS

## 2021-03-04 NOTE — Progress Notes (Signed)
Physical Therapy Treatment Patient Details Name: [name] MRN: [id number] DOB: August 25, 1945 Today's Date: 03/04/2021    History of Present Illness Patient is a 75 y.o. male admitted for anemia. Past medical history significant for atrial fibrillation, CAD, chronic kidney disease stage IV, history of stroke last month, diabetes mellitus type 2, HFpEF, mitral valve repair 5 years ago, gout, OA    PT Comments    Pt assisted with ambulating in hallway and tolerated 400 feet with RW after receiving pain meds.  Follow Up Recommendations  Outpatient PT     Equipment Recommendations  None recommended by PT    Recommendations for Other Services       Precautions / Restrictions Precautions Precautions: Fall Precaution Comments: Left knee area pain, R decreased DF    Mobility  Bed Mobility Overal bed mobility: Needs Assistance Bed Mobility: Supine to Sit     Supine to sit: Min assist     General bed mobility comments: assist for trunk into upright positioning.    Transfers Overall transfer level: Needs assistance Equipment used: Rolling walker (2 wheeled) Transfers: Sit to/from Stand Sit to Stand: Min assist         General transfer comment: increased time and effort, assist to rise and steady  Ambulation/Gait Ambulation/Gait assistance: Min guard Gait Distance (Feet): 400 Feet Assistive device: Rolling walker (2 wheeled) Gait Pattern/deviations: Decreased dorsiflexion - right;Step-through pattern     General Gait Details: increased time and effort, increased right hip/knee flexion to compensate for decreased ankle DF   Stairs             Wheelchair Mobility    Modified Rankin (Stroke Patients Only)       Balance                                            Cognition Arousal/Alertness: Awake/alert Behavior During Therapy: WFL for tasks assessed/performed Overall Cognitive Status: Within Functional Limits for tasks assessed                                         Exercises      General Comments        Pertinent Vitals/Pain Pain Assessment: Faces Faces Pain Scale: Hurts a little bit Pain Location: left knee and low back Pain Descriptors / Indicators: Aching;Grimacing;Guarding;Sharp Pain Intervention(s): Repositioned;Monitored during session;RN gave pain meds during session    Home Living                      Prior Function            PT Goals (current goals can now be found in the care plan section) Progress towards PT goals: Progressing toward goals    Frequency    Min 3X/week      PT Plan Current plan remains appropriate    Co-evaluation              AM-PAC PT "6 Clicks" Mobility   Outcome Measure  Help needed turning from your back to your side while in a flat bed without using bedrails?: A Little Help needed moving from lying on your back to sitting on the side of a flat bed without using bedrails?: A Little Help needed moving to and from a bed  to a chair (including a wheelchair)?: A Little Help needed standing up from a chair using your arms (e.g., wheelchair or bedside chair)?: A Little Help needed to walk in hospital room?: A Little Help needed climbing 3-5 steps with a railing? : A Lot 6 Click Score: 17    End of Session Equipment Utilized During Treatment: Gait belt Activity Tolerance: Patient tolerated treatment well Patient left: in chair;with call bell/phone within reach;with chair alarm set   PT Visit Diagnosis: Other abnormalities of gait and mobility (R26.89)     Time: 0131-4388 PT Time Calculation (min) (ACUTE ONLY): 30 min  Charges:  $Gait Training: 23-37 mins                    Jannette Spanner PT, DPT Acute Rehabilitation Services Pager: [phone] Office: [place] 03/04/2021, 1:43 PM

## 2021-03-04 NOTE — Transfer of Care (Signed)
Immediate Anesthesia Transfer of Care Note  Patient: [name]  Procedure(s) Performed: TRANSESOPHAGEAL ECHOCARDIOGRAM (TEE)  Patient Location: Endoscopy Unit  Anesthesia Type:MAC  Level of Consciousness: awake, alert  and oriented  Airway & Oxygen Therapy: Patient Spontanous Breathing  Post-op Assessment: Report given to RN and Post -op Vital signs reviewed and stable  Post vital signs: Reviewed and stable  Last Vitals:  Vitals Value Taken Time  BP    Temp    Pulse 71 03/04/21 1410  Resp 18 03/04/21 1410  SpO2 100 % 03/04/21 1410  Vitals shown include unvalidated device data.  Last Pain:  Vitals:   03/04/21 1322  TempSrc: Oral  PainSc: 8       Patients Stated Pain Goal: 3 (83/43/73 5789)  Complications: No notable events documented.

## 2021-03-04 NOTE — Progress Notes (Signed)
  Echocardiogram Echocardiogram Transesophageal has been performed.  [name] 03/04/2021, 3:31 PM

## 2021-03-04 NOTE — Progress Notes (Addendum)
Progress Note    [name]  ERX:[id number] DOB: 1945-12-17  DOA: 02/26/2021 PCP: [name], MD      Brief Narrative:    Medical records reviewed and are as summarized below:  [name] is a 75 y.o. male  with medical history significant for atrial fibrillation, CAD, chronic kidney disease stage IV, history of stroke last month, diabetes mellitus type 2, HFpEF, mitral valve repair 5 years ago, gout, osteoarthritis, who presented to the hospital because of generalized weakness, frequent falls at home and fever.  He said symptoms have been going on for about a week now.  He had multiple falls at home that caused him to have a lot of bruises on his back, hip and lower extremities.  He also has generalized body pain from his fall.  He was found to have acute hyponatremia and he was given IV fluids for this.  Blood culture also revealed Enterococcus faecalis so he was started on empiric IV antibiotics.      Assessment/Plan:   Principal Problem:   Anemia Active Problems:   Type 2 diabetes mellitus without complications (HCC)   Essential hypertension   CAD, AUTOLOGOUS BYPASS GRAFT   Diastolic CHF, chronic (HCC)   Hyponatremia   Weakness generalized   CKD (chronic kidney disease) stage 4, GFR 15-29 ml/min (HCC)   Atrial fibrillation, chronic (HCC)   Chronic anticoagulation   Fever   Prolonged QT interval   Bacteremia due to Gram-positive bacteria   Effusion of left knee joint    Body mass index is 27.84 kg/m.  Acute nonhemorrhagic stroke in posterior left lentiform nucleus, history of stroke, CAD: Continue Eliquis.  He does not want to resume aspirin.   Acute hyponatremia: Improved.  Sodium level is stable.  Fever at home, Enterococcus faecalis bacteremia: Continue IV ampicillin and ceftriaxone.  No evidence of osteomyelitis or discitis on MRI lumbar spine. S/p TEE on 03/04/2021 which showed history of MV repair, oscillating density associated with MV  annulus possibly due to suture from previous repair but small vegetation could not be excluded.  6 weeks of antibiotics to cover subacute bacterial endocarditis was recommended.  Follow-up with ID.  Consulted interventional radiologist for central line for long-term antibiotics.  Case discussed with Dr. Jarvis Newcomer, IR.  Left knee osteoarthritis, left knee joint effusion (probably from trauma): No evidence of infection in the left knee thus far.  Outpatient follow-up with Dr. Wynelle Link.  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  Analgesics as needed for pain.   Chronic diastolic CHF: Compensated.  CKD stage IV: Creatinine is stable  Chronic atrial fibrillation: Continue amiodarone and Eliquis.  Prolonged QT interval (569), RBBB and LAFB on EKG from 02/26/2021: Repeat EKG on 02/27/2021 showed LAD, LBBB, prolonged QTC (503).  Anemia of chronic disease: Hemoglobin is stable.  Stool for occult blood was negative.  Mildly elevated but flat troponins: This is likely from demand ischemia.  Type II DM: NovoLog as needed for hyperglycemia.  Generalized weakness, multiple falls at home: Outpatient PT was recommended.    Diet Order             Diet NPO time specified Except for: Sips with Meds  Diet effective midnight                      Consultants: Infectious disease Cardiologist for  TEE  Procedures: S/p TEE on 03/04/2021    Medications:    amiodarone  200 mg Oral Daily   apixaban  5 mg Oral BID   calcitRIOL  0.25 mcg Oral QODAY   chlorhexidine  15 mL Mouth Rinse BID   insulin aspart  0-5 Units Subcutaneous QHS   insulin aspart  0-9 Units Subcutaneous TID WC   lidocaine  1 patch Transdermal Q24H   loratadine  10 mg Oral Daily   mouth rinse  15 mL Mouth Rinse q12n4p   traZODone  100 mg Oral QHS   Continuous Infusions:  sodium  chloride Stopped (02/27/21 1600)   sodium chloride 20 mL/hr at 03/04/21 0853   ampicillin (OMNIPEN) IV 2 g (03/04/21 0342)   cefTRIAXone (ROCEPHIN)  IV Stopped (03/03/21 2209)     Anti-infectives (From admission, onward)    Start     Dose/Rate Route Frequency Ordered Stop   02/27/21 1430  cefTRIAXone (ROCEPHIN) 2 g in sodium chloride 0.9 % 100 mL IVPB        2 g 200 mL/hr over 30 Minutes Intravenous Every 12 hours 02/27/21 1339     02/26/21 2000  ampicillin (OMNIPEN) 2 g in sodium chloride 0.9 % 100 mL IVPB        2 g 300 mL/hr over 20 Minutes Intravenous Every 8 hours 02/26/21 1915                Family Communication/Anticipated D/C date and plan/Code Status   DVT prophylaxis:  apixaban (ELIQUIS) tablet 5 mg     Code Status: Full Code  Family Communication: None Disposition Plan:    Status is: Inpatient  Remains inpatient appropriate because:IV treatments appropriate due to intensity of illness or inability to take PO  Dispo: The patient is from: Home              Anticipated d/c is to: Home              Patient currently is not medically stable to d/c.   Difficult to place patient No           Subjective:   Interval events noted.  He still has pain in the left knee although it is better.  No shortness of breath or chest pain.  .  Objective:    Vitals:   03/03/21 1300 03/03/21 2103 03/04/21 0500 03/04/21 0504  BP: 118/67 127/62  124/68  Pulse: (!) 58 (!) 59  63  Resp:  20  16  Temp: 98.7 F (37.1 C) 98.4 F (36.9 C)    TempSrc: Oral Oral    SpO2: 98% 99%  99%  Weight:   88 kg   Height:       No data found.   Intake/Output Summary (Last 24 hours) at 03/04/2021 0856 Last data filed at 03/04/2021 0755 Gross per 24 hour  Intake 362.87 ml  Output 1125 ml  Net -762.13 ml   Filed Weights   03/02/21 0523 03/03/21 0500 03/04/21 0500  Weight: 86.5 kg 87.7 kg 88 kg    Exam:  GEN: NAD SKIN: Warm and dry. EYES: No pallor or  icterus ENT: MMM CV: RRR PULM: CTA B ABD: soft, ND, NT, +BS CNS: AAO x 3, non focal EXT: No edema or tenderness   6    Data Reviewed:   I have personally reviewed following labs and imaging studies:  Labs: Labs show the following:   Basic Metabolic Panel: Recent Labs  Lab 02/26/21 0236  02/27/21 0337 02/28/21 0817 03/01/21 0257  NA 127* 132* 130* 131*  K 3.8 4.2 4.2 3.7  CL 96* 100 98 97*  CO2 23 24 24 24   GLUCOSE 178* 153* 121* 114*  BUN 52* 45* 45* 45*  CREATININE 3.17* 2.52* 2.73* 2.68*  CALCIUM 8.8* 9.0 9.4 9.1   GFR Estimated Creatinine Clearance: 26.6 mL/min (A) (by C-G formula based on SCr of 2.68 mg/dL (H)). Liver Function Tests: Recent Labs  Lab 02/26/21 0236  AST 36  ALT 36  ALKPHOS 94  BILITOT 1.1  PROT 5.2*  ALBUMIN 2.8*   No results for input(s): LIPASE, AMYLASE in the last 168 hours. No results for input(s): AMMONIA in the last 168 hours. Coagulation profile No results for input(s): INR, PROTIME in the last 168 hours.  CBC: Recent Labs  Lab 02/26/21 0236 02/27/21 0337 02/28/21 0817 03/01/21 0257 03/02/21 0313 03/03/21 0847  WBC 9.2 12.8* 10.3 8.8 7.6 8.6  NEUTROABS 7.7  --  7.7 6.6 5.5 6.4  HGB 8.9* 9.0* 9.7* 8.8* 8.8* 9.9*  HCT 25.8* 26.4* 28.1* 25.8* 25.7* 29.3*  MCV 98.5 98.5 96.9 98.1 98.1 99.0  PLT 164 164 196 215 232 280   Cardiac Enzymes: No results for input(s): CKTOTAL, CKMB, CKMBINDEX, TROPONINI in the last 168 hours. BNP (last 3 results) No results for input(s): PROBNP in the last 8760 hours. CBG: Recent Labs  Lab 03/03/21 0749 03/03/21 1211 03/03/21 1707 03/03/21 2100 03/04/21 0752  GLUCAP 105* 151* 153* 208* 99   D-Dimer: No results for input(s): DDIMER in the last 72 hours. Hgb A1c: No results for input(s): HGBA1C in the last 72 hours. Lipid Profile: No results for input(s): CHOL, HDL, LDLCALC, TRIG, CHOLHDL, LDLDIRECT in the last 72 hours. Thyroid function studies: No results for input(s): TSH,  T4TOTAL, T3FREE, THYROIDAB in the last 72 hours.  Invalid input(s): FREET3 Anemia work up: No results for input(s): VITAMINB12, FOLATE, FERRITIN, TIBC, IRON, RETICCTPCT in the last 72 hours. Sepsis Labs: Recent Labs  Lab 02/26/21 0236 02/27/21 0337 02/28/21 0817 03/01/21 0257 03/02/21 0313 03/03/21 0847  WBC 9.2   < > 10.3 8.8 7.6 8.6  LATICACIDVEN 0.8  --   --   --   --   --    < > = values in this interval not displayed.    Microbiology Recent Results (from the past 240 hour(s))  Resp Panel by RT-PCR (Flu A&B, Covid) Nasopharyngeal Swab     Status: None   Collection Time: 02/26/21  2:36 AM   Specimen: Nasopharyngeal Swab; Nasopharyngeal(NP) swabs in vial transport medium  Result Value Ref Range Status   SARS Coronavirus 2 by RT PCR NEGATIVE NEGATIVE Final    Comment: (NOTE) SARS-CoV-2 target nucleic acids are NOT DETECTED.  The SARS-CoV-2 RNA is generally detectable in upper respiratory specimens during the acute phase of infection. The lowest concentration of SARS-CoV-2 viral copies this assay can detect is 138 copies/mL. A negative result does not preclude SARS-Cov-2 infection and should not be used as the sole basis for treatment or other patient management decisions. A negative result may occur with  improper specimen collection/handling, submission of specimen other than nasopharyngeal swab, presence of viral mutation(s) within the areas targeted by this assay, and inadequate number of viral copies(<138 copies/mL). A negative result must be combined with clinical observations, patient history, and epidemiological information. The expected result is Negative.  Fact Sheet for Patients:  EntrepreneurPulse.com.au  Fact Sheet for Healthcare Providers:  IncredibleEmployment.be  This test is no  t yet approved or cleared by the [place] and  has been authorized for detection and/or diagnosis of SARS-CoV-2 by FDA under an  Emergency Use Authorization (EUA). This EUA will remain  in effect (meaning this test can be used) for the duration of the COVID-19 declaration under Section 564(b)(1) of the Act, 21 U.S.C.section 360bbb-3(b)(1), unless the authorization is terminated  or revoked sooner.       Influenza A by PCR NEGATIVE NEGATIVE Final   Influenza B by PCR NEGATIVE NEGATIVE Final    Comment: (NOTE) The Xpert Xpress SARS-CoV-2/FLU/RSV plus assay is intended as an aid in the diagnosis of influenza from Nasopharyngeal swab specimens and should not be used as a sole basis for treatment. Nasal washings and aspirates are unacceptable for Xpert Xpress SARS-CoV-2/FLU/RSV testing.  Fact Sheet for Patients: EntrepreneurPulse.com.au  Fact Sheet for Healthcare Providers: IncredibleEmployment.be  This test is not yet approved or cleared by the [place] FDA and has been authorized for detection and/or diagnosis of SARS-CoV-2 by FDA under an Emergency Use Authorization (EUA). This EUA will remain in effect (meaning this test can be used) for the duration of the COVID-19 declaration under Section 564(b)(1) of the Act, 21 U.S.C. section 360bbb-3(b)(1), unless the authorization is terminated or revoked.  Performed at [facility], [place] [address] [place], [place] [address]   Blood Culture (routine x 2)     Status: Abnormal   Collection Time: 02/26/21  2:36 AM   Specimen: BLOOD  Result Value Ref Range Status   Specimen Description   Final    BLOOD SPECIMEN SOURCE NOT MARKED ON REQUISITION Performed at [facility] [address], [place], [place] [address]    Special Requests   Final    BOTTLES DRAWN AEROBIC AND ANAEROBIC Blood Culture adequate volume Performed at [facility] [address] [place], [place] [address]    Culture  Setup Time   Final    GRAM POSITIVE COCCI IN CHAINS IN BOTH AEROBIC AND  ANAEROBIC BOTTLES Organism ID to follow CRITICAL RESULT CALLED TO, READ BACK BY AND VERIFIED WITHCristopher Estimable PHARMD 1902 02/26/21 A BROWNING Performed at [facility], [place] [address], [place], [place] [address]    Culture ENTEROCOCCUS FAECALIS (A)  Final   Report Status 02/28/2021 FINAL  Final   Organism ID, Bacteria ENTEROCOCCUS FAECALIS  Final      Susceptibility   Enterococcus faecalis - MIC*    AMPICILLIN <=2 SENSITIVE Sensitive     VANCOMYCIN 1 SENSITIVE Sensitive     GENTAMICIN SYNERGY SENSITIVE Sensitive     * ENTEROCOCCUS FAECALIS  Urine culture     Status: Abnormal   Collection Time: 02/26/21  2:36 AM   Specimen: In/Out Cath Urine  Result Value Ref Range Status   Specimen Description   Final    IN/OUT CATH URINE Performed at [facility] [address] [place], [place] [address]    Special Requests   Final    NONE Performed at [facility], [place] [address] [place], [place] [address]    Culture (A)  Final    <10,000 COLONIES/mL INSIGNIFICANT GROWTH Performed at [facility] [address], [place], [place] [address]    Report Status 02/27/2021 FINAL  Final  Blood Culture ID Panel (Reflexed)     Status: Abnormal   Collection Time: 02/26/21  2:36 AM  Result Value Ref Range Status   Enterococcus faecalis DETECTED (  A) NOT DETECTED Final    Comment: CRITICAL RESULT CALLED TO, READ BACK BY AND VERIFIED WITH: E WILLIAMSON PHARMD 1902 02/26/21 A BROWNING    Enterococcus Faecium NOT DETECTED NOT DETECTED Final   Listeria monocytogenes NOT DETECTED NOT DETECTED Final   Staphylococcus species NOT DETECTED NOT DETECTED Final   Staphylococcus aureus (BCID) NOT DETECTED NOT DETECTED Final   Staphylococcus epidermidis NOT DETECTED NOT DETECTED Final   Staphylococcus lugdunensis NOT DETECTED NOT DETECTED Final   Streptococcus species NOT DETECTED NOT DETECTED Final   Streptococcus agalactiae NOT DETECTED NOT DETECTED Final    Streptococcus pneumoniae NOT DETECTED NOT DETECTED Final   Streptococcus pyogenes NOT DETECTED NOT DETECTED Final   A.calcoaceticus-baumannii NOT DETECTED NOT DETECTED Final   Bacteroides fragilis NOT DETECTED NOT DETECTED Final   Enterobacterales NOT DETECTED NOT DETECTED Final   Enterobacter cloacae complex NOT DETECTED NOT DETECTED Final   Escherichia coli NOT DETECTED NOT DETECTED Final   Klebsiella aerogenes NOT DETECTED NOT DETECTED Final   Klebsiella oxytoca NOT DETECTED NOT DETECTED Final   Klebsiella pneumoniae NOT DETECTED NOT DETECTED Final   Proteus species NOT DETECTED NOT DETECTED Final   Salmonella species NOT DETECTED NOT DETECTED Final   Serratia marcescens NOT DETECTED NOT DETECTED Final   Haemophilus influenzae NOT DETECTED NOT DETECTED Final   Neisseria meningitidis NOT DETECTED NOT DETECTED Final   Pseudomonas aeruginosa NOT DETECTED NOT DETECTED Final   Stenotrophomonas maltophilia NOT DETECTED NOT DETECTED Final   Candida albicans NOT DETECTED NOT DETECTED Final   Candida auris NOT DETECTED NOT DETECTED Final   Candida glabrata NOT DETECTED NOT DETECTED Final   Candida krusei NOT DETECTED NOT DETECTED Final   Candida parapsilosis NOT DETECTED NOT DETECTED Final   Candida tropicalis NOT DETECTED NOT DETECTED Final   Cryptococcus neoformans/gattii NOT DETECTED NOT DETECTED Final   Vancomycin resistance NOT DETECTED NOT DETECTED Final    Comment: Performed at [facility], [address] [place], [place] [address]  Culture, blood (Routine X 2) w Reflex to ID Panel     Status: None (Preliminary result)   Collection Time: 02/28/21  4:11 PM   Specimen: BLOOD  Result Value Ref Range Status   Specimen Description   Final    BLOOD RIGHT ANTECUBITAL Performed at [facility] [address], [place], [place] [address]    Special Requests   Final    BOTTLES DRAWN AEROBIC AND ANAEROBIC Blood Culture adequate volume Performed at [facility] [address] [place], [place] [address]    Culture   Final    NO GROWTH 4 DAYS Performed at [facility], [place] [address] [place], [place] [address]    Report Status PENDING  Incomplete  Culture, blood (Routine X 2) w Reflex to ID Panel     Status: None (Preliminary result)   Collection Time: 02/28/21  4:22 PM   Specimen: BLOOD RIGHT HAND  Result Value Ref Range Status   Specimen Description   Final    BLOOD RIGHT HAND Performed at [facility] [address], [place], [place] [address]    Special Requests   Final    BOTTLES DRAWN AEROBIC AND ANAEROBIC Blood Culture adequate volume Performed at [facility] [address], [place], [place] [address]    Culture   Final    NO GROWTH 4 DAYS Performed at [facility], [place] [address] [place], [place] [address]  Report Status PENDING  Incomplete  Body fluid culture w Gram Stain     Status: None (Preliminary result)   Collection Time: 03/01/21  6:31 PM   Specimen: Synovium; Body Fluid  Result Value Ref Range Status   Specimen Description   Final    SYNOVIAL LEFT KNEE Performed at [facility] [address] [place], [place] [address]    Special Requests   Final    Normal Performed at [facility], [place] [address], [place], [place] [address]    Gram Stain   Final    ABUNDANT WBC PRESENT, PREDOMINANTLY PMN NO ORGANISMS SEEN    Culture   Final    NO GROWTH 2 DAYS Performed at [facility], [place] [address] [place], [place] [address]    Report Status PENDING  Incomplete    Procedures and diagnostic studies:  No results found.             LOS: 6 days   Daeshon Grammatico  [facility]   Pager on [email or web address]. If 7PM-7AM, please contact night-coverage at [email or web address]     03/04/2021, 8:56 AM

## 2021-03-04 NOTE — Progress Notes (Signed)
    Transesophageal Echocardiogram Note  [name] [id number] 23-Oct-1945  Procedure: Transesophageal Echocardiogram Indications: Bacteremia  Procedure Details Consent: Obtained Time Out: Verified patient identification, verified procedure, site/side was marked, verified correct patient position, special equipment/implants available, Radiology Safety Procedures followed,  medications/allergies/relevent history reviewed, required imaging and test results available.  Performed  Medications:  Pt sedated by anesthesia with lidocaine 40 mg and diprovan 207 mg IV.  Normal LV function, previous mitral valve repair, small oscillating density associated with mitral valve annulus likely suture from previous repair but cannot exclude small vegetation.  Patient will likely need to be treated with 6 weeks of antibiotics.  No mitral regurgitation.  Elevated mean gradient of 12 mmHg across the mitral valve.  Complications: No apparent complications Patient did tolerate procedure well.  Kirk Ruths, MD

## 2021-03-04 NOTE — Progress Notes (Signed)
Reports received from RN. Agreed with nurse assessment of patient and will cont to monitor.

## 2021-03-04 NOTE — Anesthesia Preprocedure Evaluation (Addendum)
Anesthesia Evaluation  Patient identified by MRN, date of birth, ID band Patient awake    Reviewed: Allergy & Precautions, NPO status , Patient's Chart, lab work & pertinent test results  History of Anesthesia Complications Negative for: history of anesthetic complications  Airway Mallampati: III  TM Distance: >3 FB Neck ROM: Full    Dental no notable dental hx.    Pulmonary sleep apnea and Continuous Positive Airway Pressure Ventilation ,    Pulmonary exam normal breath sounds clear to auscultation       Cardiovascular hypertension, + CAD, + Past MI, + CABG and +CHF  Normal cardiovascular exam+ dysrhythmias Atrial Fibrillation  Rhythm:Regular Rate:Normal  08/07/19 Ech EF 50-55% CAD S/P CABG   Neuro/Psych PSYCHIATRIC DISORDERS Anxiety Depression PTSD (post-traumatic stress disorder)TIACVA    GI/Hepatic PUD, GERD  Controlled,  Endo/Other  diabetes  Renal/GU CRFRenal disease     Musculoskeletal  (+) Arthritis , Gout   Abdominal   Peds  Hematology  (+) anemia ,   Anesthesia Other Findings BACTEREMIA  Reproductive/Obstetrics                            Anesthesia Physical  Anesthesia Plan  ASA: 3  Anesthesia Plan: MAC   Post-op Pain Management:    Induction: Intravenous  PONV Risk Score and Plan: 1 and Propofol infusion and Treatment may vary due to age or medical condition  Airway Management Planned: Nasal Cannula  Additional Equipment:   Intra-op Plan:   Post-operative Plan:   Informed Consent: I have reviewed the patients History and Physical, chart, labs and discussed the procedure including the risks, benefits and alternatives for the proposed anesthesia with the patient or authorized representative who has indicated his/her understanding and acceptance.     Dental advisory given  Plan Discussed with: CRNA  Anesthesia Plan Comments:       Anesthesia Quick  Evaluation

## 2021-03-04 NOTE — Interval H&P Note (Signed)
History and Physical Interval Note:  03/04/2021 12:59 PM  [name]  has presented today for surgery, with the diagnosis of BACTERIMIA.  The various methods of treatment have been discussed with the patient and family. After consideration of risks, benefits and other options for treatment, the patient has consented to  Procedure(s): TRANSESOPHAGEAL ECHOCARDIOGRAM (TEE) (N/A) as a surgical intervention.  The patient's history has been reviewed, patient examined, no change in status, stable for surgery.  I have reviewed the patient's chart and labs.  Questions were answered to the patient's satisfaction.     Kirk Ruths

## 2021-03-04 NOTE — Anesthesia Procedure Notes (Signed)
Procedure Name: MAC Date/Time: 03/04/2021 1:38 PM Performed by: Dorthea Cove, CRNA Pre-anesthesia Checklist: Patient identified, Emergency Drugs available, Suction available, Patient being monitored and Timeout performed Patient Re-evaluated:Patient Re-evaluated prior to induction Oxygen Delivery Method: Simple face mask Preoxygenation: Pre-oxygenation with 100% oxygen Induction Type: IV induction

## 2021-03-04 NOTE — Progress Notes (Signed)
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 [place] for Infectious Diseases [phone]

## 2021-03-04 NOTE — Progress Notes (Signed)
Pt transported to [facility] for TEE. [name]

## 2021-03-04 NOTE — Progress Notes (Signed)
PHARMACY CONSULT NOTE FOR:  OUTPATIENT  PARENTERAL ANTIBIOTIC THERAPY (OPAT)  Indication: Enterococcal endocarditis Regimen: Ampicillin 2 gm IV Q 8 hours + Ceftriaxone 2 gm IV Q 12 hours  End date: 04/11/21  IV antibiotic discharge orders are pended. To discharging provider:  please sign these orders via discharge navigator,  Select New Orders & click on the button choice - Manage This Unsigned Work.     Thank you for allowing pharmacy to be a part of this patient's care.  Jimmy Footman, PharmD, BCPS, BCIDP Infectious Diseases Clinical Pharmacist Phone: [phone] 03/04/2021, 3:58 PM

## 2021-03-05 DIAGNOSIS — Z7901 Long term (current) use of anticoagulants: Secondary | ICD-10-CM | POA: Diagnosis not present

## 2021-03-05 DIAGNOSIS — Z8673 Personal history of transient ischemic attack (TIA), and cerebral infarction without residual deficits: Secondary | ICD-10-CM | POA: Diagnosis not present

## 2021-03-05 DIAGNOSIS — R7881 Bacteremia: Secondary | ICD-10-CM | POA: Diagnosis not present

## 2021-03-05 DIAGNOSIS — N184 Chronic kidney disease, stage 4 (severe): Secondary | ICD-10-CM | POA: Diagnosis not present

## 2021-03-05 DIAGNOSIS — T826XXS Infection and inflammatory reaction due to cardiac valve prosthesis, sequela: Secondary | ICD-10-CM

## 2021-03-05 LAB — GLUCOSE, CAPILLARY
Glucose-Capillary: 136 mg/dL — ABNORMAL HIGH (ref 70–99)
Glucose-Capillary: 173 mg/dL — ABNORMAL HIGH (ref 70–99)
Glucose-Capillary: 146 mg/dL — ABNORMAL HIGH (ref 70–99)
Glucose-Capillary: 135 mg/dL — ABNORMAL HIGH (ref 70–99)

## 2021-03-05 LAB — CULTURE, BLOOD (ROUTINE X 2)
Culture: NO GROWTH
Culture: NO GROWTH
Special Requests: ADEQUATE
Special Requests: ADEQUATE

## 2021-03-05 LAB — BODY FLUID CULTURE W GRAM STAIN
Culture: NO GROWTH
Special Requests: NORMAL

## 2021-03-05 NOTE — TOC Progression Note (Signed)
Transition of Care [facility]) - Progression Note    Patient Details  Name: [name] MRN: [id number] Date of Birth: 1946/04/30  [facility] CM/SW Contact  Ross Ludwig, [place] Phone Number: 03/03/21 4:35pm  Clinical Narrative:     Patient may need home IV antibiotics, CSW following patient's progress throughout discharge planning.  Expected Discharge Plan: [place] Barriers to Discharge: Continued Medical Work up  Expected Discharge Plan and Services Expected Discharge Plan: [place] In-house Referral: Clinical Social Work     Living arrangements for the past 2 months: Single Family Home                                       Social Determinants of Health (SDOH) Interventions    Readmission Risk Interventions No flowsheet data found.

## 2021-03-05 NOTE — Progress Notes (Addendum)
[name]  FXJ:[id number] DOB: 01-09-1946 DOA: 02/26/2021 PCP: [name], MD    Brief Narrative:  75 year old with a history of atrial fibrillation, CAD, CKD stage IV, DM 2, mitral valve repair, gout, and stroke June 2022 who presented to the ED with severe generalized weakness and frequent falls at home along with intermittent fevers.  Significant Events:  His initial work-up revealed acute hyponatremia.  Blood cultures were found to be positive for Enterococcus faecalis.  7/12 TEE  Consultants:  Infectious Disease Cardiology  Code Status: FULL CODE  Antimicrobials:  Ampicillin 7/6 > Ceftriaxone 7/7 >  DVT prophylaxis: Eliquis  Subjective: Afebrile.  Vital signs stable.  Saturation 95% on room air.  Resting comfortably in his bed.  States he is anxious to get back to his rehab facility.  Bowels to get moving and improve his physical recovery.  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 No evidence of osteomyelitis/discitis on MRI lumbar spine -TEE 03/04/2021 noted oscillating density on a repaired mitral valve likely related to suture but small vegetation cannot be ruled out -ID prescribed 6 weeks of ampicillin plus ceftriaxone and outpatient ID follow-up - IR consulted for PICC for long-term antibiotic therapy - should be ready for D/C home after PICC placed   Acute thromboembolic stroke posterior left lentiform nucleus -recent stroke June 2022 Continue Eliquis therapy  Acute hyponatremia Corrected with volume resuscitation  Left knee osteoarthritis with effusion Outpatient follow-up with Dr. Maureen Ralphs -synovial fluid culture no growth  Low back pain Multifactorial degenerative changes noted at L4/5 with severe canal lateral recess stenosis and severe canal lateral recess stenosis M4/B5  Chronic diastolic CHF Compensated at present  AKI on CKD stage IV Renal function now stable  Recent Labs  Lab 02/27/21 0337  02/28/21 0817 03/01/21 0257  CREATININE 2.52* 2.73* 2.68*     Chronic atrial fibrillation Continue usual amiodarone and Eliquis  Prolonged QT  Anemia of chronic disease Hemoglobin stable  DM2 CBG controlled  Generalized weakness with multiple falls at home Outpatient PT   Family Communication:  Status is: Inpatient  Remains inpatient appropriate because:Inpatient level of care appropriate due to severity of illness  Dispo: The patient is from: Home              Anticipated d/c is to: Home              Patient currently is not medically stable to d/c.   Difficult to place patient No    Objective: Blood pressure 124/65, pulse 62, temperature 98.5 F (36.9 C), temperature source Oral, resp. rate 18, height 5\' 10"  (1.778 m), weight 87.1 kg, SpO2 95 %.  Intake/Output Summary (Last 24 hours) at 03/05/2021 0842 Last data filed at 03/05/2021 0531 Gross per 24 hour  Intake 668.18 ml  Output 875 ml  Net -206.82 ml   Filed Weights   03/04/21 0500 03/04/21 1322 03/05/21 0500  Weight: 88 kg 88 kg 87.1 kg    Examination: General: No acute respiratory distress Lungs: Clear to auscultation bilaterally without wheezes or crackles Cardiovascular: Regular rate and rhythm without murmur gallop or rub normal S1 and S2 Abdomen: Nontender, nondistended, soft, bowel sounds positive, no rebound, no ascites, no appreciable mass Extremities: No significant cyanosis, clubbing, or edema bilateral lower extremities  CBC: Recent Labs  Lab 03/01/21 0257 03/02/21 0313 03/03/21 0847  WBC 8.8 7.6 8.6  NEUTROABS 6.6 5.5 6.4  HGB 8.8* 8.8* 9.9*  HCT 25.8* 25.7* 29.3*  MCV  98.1 98.1 99.0  PLT 215 232 696   Basic Metabolic Panel: Recent Labs  Lab 02/27/21 0337 02/28/21 0817 03/01/21 0257  NA 132* 130* 131*  K 4.2 4.2 3.7  CL 100 98 97*  CO2 24 24 24   GLUCOSE 153* 121* 114*  BUN 45* 45* 45*  CREATININE 2.52* 2.73* 2.68*  CALCIUM 9.0 9.4 9.1   GFR: Estimated Creatinine  Clearance: 24.6 mL/min (A) (by C-G formula based on SCr of 2.68 mg/dL (H)).    HbA1C: Hgb A1c MFr Bld  Date/Time Value Ref Range Status  02/02/2021 02:14 AM 6.2 (H) 4.8 - 5.6 % Final    Comment:    (NOTE)         Prediabetes: 5.7 - 6.4         Diabetes: >6.4         Glycemic control for adults with diabetes: <7.0   01/07/2021 12:43 PM 6.3 (H) 4.8 - 5.6 % Final    Comment:    (NOTE) Pre diabetes:          5.7%-6.4%  Diabetes:              >6.4%  Glycemic control for   <7.0% adults with diabetes     CBG: Recent Labs  Lab 03/04/21 1135 03/04/21 1426 03/04/21 1636 03/04/21 2113 03/05/21 0730  GLUCAP 121* 118* 131* 207* 136*    Recent Results (from the past 240 hour(s))  Resp Panel by RT-PCR (Flu A&B, Covid) Nasopharyngeal Swab     Status: None   Collection Time: 02/26/21  2:36 AM   Specimen: Nasopharyngeal Swab; Nasopharyngeal(NP) swabs in vial transport medium  Result Value Ref Range Status   SARS Coronavirus 2 by RT PCR NEGATIVE NEGATIVE Final    Comment: (NOTE) SARS-CoV-2 target nucleic acids are NOT DETECTED.  The SARS-CoV-2 RNA is generally detectable in upper respiratory specimens during the acute phase of infection. The lowest concentration of SARS-CoV-2 viral copies this assay can detect is 138 copies/mL. A negative result does not preclude SARS-Cov-2 infection and should not be used as the sole basis for treatment or other patient management decisions. A negative result may occur with  improper specimen collection/handling, submission of specimen other than nasopharyngeal swab, presence of viral mutation(s) within the areas targeted by this assay, and inadequate number of viral copies(<138 copies/mL). A negative result must be combined with clinical observations, patient history, and epidemiological information. The expected result is Negative.  Fact Sheet for Patients:  EntrepreneurPulse.com.au  Fact Sheet for Healthcare Providers:   IncredibleEmployment.be  This test is no t yet approved or cleared by the [place] FDA and  has been authorized for detection and/or diagnosis of SARS-CoV-2 by FDA under an Emergency Use Authorization (EUA). This EUA will remain  in effect (meaning this test can be used) for the duration of the COVID-19 declaration under Section 564(b)(1) of the Act, 21 U.S.C.section 360bbb-3(b)(1), unless the authorization is terminated  or revoked sooner.       Influenza A by PCR NEGATIVE NEGATIVE Final   Influenza B by PCR NEGATIVE NEGATIVE Final    Comment: (NOTE) The Xpert Xpress SARS-CoV-2/FLU/RSV plus assay is intended as an aid in the diagnosis of influenza from Nasopharyngeal swab specimens and should not be used as a sole basis for treatment. Nasal washings and aspirates are unacceptable for Xpert Xpress SARS-CoV-2/FLU/RSV testing.  Fact Sheet for Patients: EntrepreneurPulse.com.au  Fact Sheet for Healthcare Providers: IncredibleEmployment.be  This test is not yet approved or cleared by  the [employer] and has been authorized for detection and/or diagnosis of SARS-CoV-2 by FDA under an Emergency Use Authorization (EUA). This EUA will remain in effect (meaning this test can be used) for the duration of the COVID-19 declaration under Section 564(b)(1) of the Act, 21 U.S.C. section 360bbb-3(b)(1), unless the authorization is terminated or revoked.  Performed at [facility], [place] [address] [place], [place] [address]   Blood Culture (routine x 2)     Status: Abnormal   Collection Time: 02/26/21  2:36 AM   Specimen: BLOOD  Result Value Ref Range Status   Specimen Description   Final    BLOOD SPECIMEN SOURCE NOT MARKED ON REQUISITION Performed at [facility] [address] [place], [place] [address]    Special Requests   Final    BOTTLES DRAWN AEROBIC AND ANAEROBIC Blood  Culture adequate volume Performed at [facility] [address] [place], [place] [address]    Culture  Setup Time   Final    GRAM POSITIVE COCCI IN CHAINS IN BOTH AEROBIC AND ANAEROBIC BOTTLES Organism ID to follow CRITICAL RESULT CALLED TO, READ BACK BY AND VERIFIED WITHCristopher Estimable PHARMD 1902 02/26/21 A BROWNING Performed at [facility], [place] [address], [place], [place] [address]    Culture ENTEROCOCCUS FAECALIS (A)  Final   Report Status 02/28/2021 FINAL  Final   Organism ID, Bacteria ENTEROCOCCUS FAECALIS  Final      Susceptibility   Enterococcus faecalis - MIC*    AMPICILLIN <=2 SENSITIVE Sensitive     VANCOMYCIN 1 SENSITIVE Sensitive     GENTAMICIN SYNERGY SENSITIVE Sensitive     * ENTEROCOCCUS FAECALIS  Urine culture     Status: Abnormal   Collection Time: 02/26/21  2:36 AM   Specimen: In/Out Cath Urine  Result Value Ref Range Status   Specimen Description   Final    IN/OUT CATH URINE Performed at [facility] [address] [place], [place] [address]    Special Requests   Final    NONE Performed at [facility], [place] [address], [place], [place] [address]    Culture (A)  Final    <10,000 COLONIES/mL INSIGNIFICANT GROWTH Performed at [facility] [address], [place],  [address]    Report Status 02/27/2021 FINAL  Final  Blood Culture ID Panel (Reflexed)     Status: Abnormal   Collection Time: 02/26/21  2:36 AM  Result Value Ref Range Status   Enterococcus faecalis DETECTED (A) NOT DETECTED Final    Comment: CRITICAL RESULT CALLED TO, READ BACK BY AND VERIFIED WITH: E WILLIAMSON PHARMD 1902 02/26/21 A BROWNING    Enterococcus Faecium NOT DETECTED NOT DETECTED Final   Listeria monocytogenes NOT DETECTED NOT DETECTED Final   Staphylococcus species NOT DETECTED NOT DETECTED Final   Staphylococcus aureus (BCID) NOT DETECTED NOT DETECTED Final   Staphylococcus epidermidis NOT DETECTED  NOT DETECTED Final   Staphylococcus lugdunensis NOT DETECTED NOT DETECTED Final   Streptococcus species NOT DETECTED NOT DETECTED Final   Streptococcus agalactiae NOT DETECTED NOT DETECTED Final   Streptococcus pneumoniae NOT DETECTED NOT DETECTED Final   Streptococcus pyogenes NOT DETECTED NOT DETECTED Final   A.calcoaceticus-baumannii NOT DETECTED NOT DETECTED Final   Bacteroides fragilis NOT DETECTED NOT DETECTED Final   Enterobacterales NOT DETECTED NOT DETECTED Final   Enterobacter cloacae complex NOT DETECTED NOT DETECTED Final   Escherichia coli NOT DETECTED NOT  DETECTED Final   Klebsiella aerogenes NOT DETECTED NOT DETECTED Final   Klebsiella oxytoca NOT DETECTED NOT DETECTED Final   Klebsiella pneumoniae NOT DETECTED NOT DETECTED Final   Proteus species NOT DETECTED NOT DETECTED Final   Salmonella species NOT DETECTED NOT DETECTED Final   Serratia marcescens NOT DETECTED NOT DETECTED Final   Haemophilus influenzae NOT DETECTED NOT DETECTED Final   Neisseria meningitidis NOT DETECTED NOT DETECTED Final   Pseudomonas aeruginosa NOT DETECTED NOT DETECTED Final   Stenotrophomonas maltophilia NOT DETECTED NOT DETECTED Final   Candida albicans NOT DETECTED NOT DETECTED Final   Candida auris NOT DETECTED NOT DETECTED Final   Candida glabrata NOT DETECTED NOT DETECTED Final   Candida krusei NOT DETECTED NOT DETECTED Final   Candida parapsilosis NOT DETECTED NOT DETECTED Final   Candida tropicalis NOT DETECTED NOT DETECTED Final   Cryptococcus neoformans/gattii NOT DETECTED NOT DETECTED Final   Vancomycin resistance NOT DETECTED NOT DETECTED Final    Comment: Performed at [facility], [place] [address], [place], [place] [address]  Culture, blood (Routine X 2) w Reflex to ID Panel     Status: None (Preliminary result)   Collection Time: 02/28/21  4:11 PM   Specimen: BLOOD  Result Value Ref Range Status   Specimen Description   Final    BLOOD RIGHT ANTECUBITAL Performed at  [facility] [address], [place], [place] [address]    Special Requests   Final    BOTTLES DRAWN AEROBIC AND ANAEROBIC Blood Culture adequate volume Performed at [facility] [address] [place], [place] [address]    Culture   Final    NO GROWTH 4 DAYS Performed at [facility], [place] [address], [place], [place] [address]    Report Status PENDING  Incomplete  Culture, blood (Routine X 2) w Reflex to ID Panel     Status: None (Preliminary result)   Collection Time: 02/28/21  4:22 PM   Specimen: BLOOD RIGHT HAND  Result Value Ref Range Status   Specimen Description   Final    BLOOD RIGHT HAND Performed at [facility] [address] [place], [place] [address]    Special Requests   Final    BOTTLES DRAWN AEROBIC AND ANAEROBIC Blood Culture adequate volume Performed at [facility] [address], [place], [place] [address]    Culture   Final    NO GROWTH 4 DAYS Performed at [facility], [place] [address] [place], [place] [address]    Report Status PENDING  Incomplete  Body fluid culture w Gram Stain     Status: None (Preliminary result)   Collection Time: 03/01/21  6:31 PM   Specimen: Synovium; Body Fluid  Result Value Ref Range Status   Specimen Description   Final    SYNOVIAL LEFT KNEE Performed at [facility] [address], [place], [place] [address]    Special Requests   Final    Normal Performed at [facility], [name] [address], [place], [place] [address]    Gram Stain   Final    ABUNDANT WBC PRESENT, PREDOMINANTLY PMN NO ORGANISMS SEEN    Culture   Final    NO GROWTH 3 DAYS Performed at [facility], [place] [address] [place], [place] [address]    Report Status PENDING  Incomplete     Scheduled Meds:  amiodarone  200 mg Oral Daily  apixaban  5 mg Oral BID   calcitRIOL  0.25 mcg Oral QODAY   chlorhexidine  15 mL Mouth  Rinse BID   insulin aspart  0-5 Units Subcutaneous QHS   insulin aspart  0-9 Units Subcutaneous TID WC   lidocaine  1 patch Transdermal Q24H   loratadine  10 mg Oral Daily   mouth rinse  15 mL Mouth Rinse q12n4p   traZODone  100 mg Oral QHS   Continuous Infusions:  sodium chloride Stopped (02/27/21 1600)   sodium chloride 10 mL/hr at 03/04/21 1553   ampicillin (OMNIPEN) IV 2 g (03/05/21 0417)   cefTRIAXone (ROCEPHIN)  IV Stopped (03/04/21 2208)     LOS: 7 days   [name], MD [facility] Office  [phone] Pager - Text Page per Shea Evans  If 7PM-7AM, please contact night-coverage per Amion 03/05/2021, 8:42 AM

## 2021-03-05 NOTE — Progress Notes (Signed)
Occupational Therapy Treatment Patient Details Name: [name] MRN: [id number] DOB: 03/16/1946 Today's Date: 03/05/2021    History of present illness Patient is a 75 y.o. male admitted for anemia. Past medical history significant for atrial fibrillation, CAD, chronic kidney disease stage IV, history of stroke last month, diabetes mellitus type 2, HFpEF, mitral valve repair 5 years ago, gout, OA   OT comments  Patient reports already brushing his teeth, washing up and using bathroom today. Asking about UE exercises "I want to build some strength." Provided patient hand out of theraband upper extremity exercises and highlighted ones reviewed. Patient completed 1 set of 10 of exercises listed below with up to mod cues for proper technique. Educate patient on frequency, how to grade exercises easier/harder as needed. Will continue to follow.    Follow Up Recommendations  No OT follow up    Equipment Recommendations  None recommended by OT       Precautions / Restrictions Precautions Precautions: Fall Precaution Comments: Left knee area pain, R decreased DF       Mobility Bed Mobility Overal bed mobility: Needs Assistance Bed Mobility: Supine to Sit;Sit to Supine     Supine to sit: Min assist;HOB elevated Sit to supine: Supervision   General bed mobility comments: min A for trunk support    Transfers                      Balance Overall balance assessment: History of Falls;Needs assistance Sitting-balance support: Feet supported Sitting balance-Leahy Scale: Good                                     ADL either performed or assessed with clinical judgement   ADL                                         General ADL Comments: patient reports already using bathroom, has been up to chair and washed up this morning      Cognition Arousal/Alertness: Awake/alert Behavior During Therapy: WFL for tasks assessed/performed Overall  Cognitive Status: Within Functional Limits for tasks assessed                                          Exercises Exercises: General Upper Extremity;Other exercises General Exercises - Upper Extremity Shoulder Flexion: Strengthening;Both;10 reps;Seated;Theraband Theraband Level (Shoulder Flexion): Level 4 (Blue) Shoulder Extension: Strengthening;Both;10 reps;Seated;Theraband Theraband Level (Shoulder Extension): Level 4 (Blue) Shoulder ABduction: Strengthening;10 reps;Seated;Theraband Theraband Level (Shoulder Abduction): Level 4 (Blue) Elbow Flexion: Strengthening;Both;10 reps;Seated;Theraband Theraband Level (Elbow Flexion): Level 4 (Blue) Elbow Extension: Strengthening;Both;10 reps;Seated;Theraband Theraband Level (Elbow Extension): Level 4 (Blue) Other Exercises Other Exercises: Chest pulls x10 reps Blue theraband. Provided patient a hand out of exercises performed and highlighted in packet. Patient needing up to mod cues for proper technique           Pertinent Vitals/ Pain       Pain Assessment: Faces Faces Pain Scale: Hurts a little bit Pain Location: back Pain Descriptors / Indicators: Sore;Grimacing Pain Intervention(s): Premedicated before session         Frequency  Min 2X/week        Progress Toward Goals  OT Goals(current  goals can now be found in the care plan section)  Progress towards OT goals: Progressing toward goals  Acute Rehab OT Goals Patient Stated Goal: walk without falling OT Goal Formulation: With patient Time For Goal Achievement: 03/13/21 Potential to Achieve Goals: Good ADL Goals Pt Will Perform Lower Body Dressing: with min assist;sit to/from stand Pt Will Transfer to Toilet: with min guard assist;ambulating;regular height toilet;grab bars Pt Will Perform Toileting - Clothing Manipulation and hygiene: with min guard assist;sit to/from stand Additional ADL Goal #1: Patient will stand at sink to perform grooming task as  evidence of improving activity tolerance and balance  Plan Discharge plan remains appropriate       AM-PAC OT "6 Clicks" Daily Activity     Outcome Measure   Help from another person eating meals?: None Help from another person taking care of personal grooming?: None Help from another person toileting, which includes using toliet, bedpan, or urinal?: A Little Help from another person bathing (including washing, rinsing, drying)?: A Little Help from another person to put on and taking off regular upper body clothing?: A Little Help from another person to put on and taking off regular lower body clothing?: A Lot 6 Click Score: 19    End of Session    OT Visit Diagnosis: Other abnormalities of gait and mobility (R26.89);Repeated falls (R29.6);Pain Pain - Right/Left: Left Pain - part of body: Knee (back)   Activity Tolerance Patient tolerated treatment well   Patient Left in bed;with call bell/phone within reach;with bed alarm set   Nurse Communication          Time: 9983-3825 OT Time Calculation (min): 24 min  Charges: OT General Charges $OT Visit: 1 Visit OT Treatments $Therapeutic Exercise: 23-37 mins  Delbert Phenix OT OT pager: [phone]   Rosemary Holms 03/05/2021, 2:43 PM

## 2021-03-05 NOTE — Progress Notes (Signed)
[facility]    Date of Admission:  02/26/2021   Total days of antibiotics 8          ID: [name] is a 75 y.o. 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 Awaiting to get line placed; " I am feeling much better"  Medications:   amiodarone  200 mg Oral Daily   apixaban  5 mg Oral BID   calcitRIOL  0.25 mcg Oral QODAY   chlorhexidine  15 mL Mouth Rinse BID   insulin aspart  0-5 Units Subcutaneous QHS   insulin aspart  0-9 Units Subcutaneous TID WC   lidocaine  1 patch Transdermal Q24H   loratadine  10 mg Oral Daily   mouth rinse  15 mL Mouth Rinse q12n4p   traZODone  100 mg Oral QHS    Objective: Vital signs in last 24 hours: Temp:  [97.8 F (36.6 C)-98.5 F (36.9 C)] 97.8 F (36.6 C) (07/13 1301) Pulse Rate:  [58-62] 58 (07/13 1301) Resp:  [14-18] 16 (07/13 1507) BP: (114-124)/(57-65) 117/65 (07/13 1301) SpO2:  [95 %-100 %] 100 % (07/13 1507) Weight:  [87.1 kg] 87.1 kg (07/13 0500) Physical Exam  Constitutional: He is oriented to person, place, and time. He appears well-developed and well-nourished. No distress.  HENT:  Mouth/Throat: Oropharynx is clear and moist. No oropharyngeal exudate.  Cardiovascular: Normal rate, regular rhythm and normal heart sounds. Exam reveals no gallop and no friction rub.  No murmur heard.  Pulmonary/Chest: Effort normal and breath sounds normal. No respiratory distress. He has no wheezes.   Abdominal: Soft. Bowel sounds are normal. He exhibits no distension. There is no tenderness.  Ext: left forearm thrill Neurological: He is alert and oriented to person, place, and time.  Skin: Skin is warm and dry. No rash noted. No erythema.  Psychiatric: He has a normal mood and affect. His behavior is normal.    Lab Results Recent Labs    03/03/21 0847  WBC 8.6  HGB 9.9*  HCT 29.3*     Microbiology: 7/8 blood cx: NGTD 7/9 aspirate cx: NGTD Studies/Results: ECHO TEE  Result Date: 03/04/2021    TRANSESOPHOGEAL ECHO REPORT   Patient Name:   [name] [facility] Date of Exam: 03/04/2021 Medical Rec #:  [id number]      Height:       70.0 in Accession #:    [id number]     Weight:       194.0 lb Date of Birth:  [birth year]      BSA:          2.060 m Patient Age:    67 years       BP:           118/57 mmHg Patient Gender: M              HR:           68 bpm. Exam Location:  Inpatient Procedure:  3D Echo, Transesophageal Echo, Cardiac Doppler and Color Doppler Indications:     Bacteremia R78.81  History:         Patient has prior history of Echocardiogram examinations, most                  recent 10/05/2020. CHF, CAD and Previous Myocardial Infarction,                  TIA, Mitral Valve Disease; Risk Factors:Hypertension, Diabetes,                  Dyslipidemia and Sleep Apnea.                   Mitral Valve: valve is present in the mitral position.                  Procedure Date: 03/25/2016.  Sonographer:     Tiffany Dance Referring Phys:  [id number] HAO MENG Diagnosing Phys: Kirk Ruths MD PROCEDURE: The transesophogeal probe was passed without difficulty through the esophogus of the patient. Sedation performed by different physician. The patient was monitored while under deep sedation. Anesthestetic sedation was provided intravenously by Anesthesiology: 207mg  of Propofol, 40mg  of Lidocaine. The patient developed no complications during the procedure. IMPRESSIONS  1. S/P MV repair; oscillating density  associated with MV annulus likely suture from previous repair; cannot exclude small vegetation; likely needs 6 weeks antibiotics to cover SBE; no MR; elevated mean gradient (12 mmHg).  2. Left ventricular ejection fraction, by estimation, is 60 to 65%. The left ventricle has normal function. The left ventricle has no regional wall motion abnormalities.  3. Right ventricular systolic function is normal. The right ventricular size is mildly enlarged.  4. Left atrial size was moderately dilated. No left atrial/left atrial appendage thrombus was detected.  5. The mitral valve has been repaired/replaced. No evidence of mitral valve regurgitation. There is a present in the mitral position. Procedure Date: 03/25/2016.  6. The aortic valve is tricuspid. Aortic valve regurgitation is not visualized.  7. There is mild (Grade II) plaque involving the descending aorta.  8. Evidence of atrial level shunting detected by color flow Doppler. There is a large patent foramen ovale. FINDINGS  Left Ventricle: Left ventricular ejection fraction, by estimation, is 60 to 65%. The left ventricle has normal function. The left ventricle has no regional wall motion abnormalities. The left ventricular internal cavity size was normal in size. Right Ventricle: The right ventricular size is mildly enlarged.Right ventricular systolic function is normal. Left Atrium: Left atrial size was moderately dilated. No left atrial/left atrial appendage thrombus was detected. Right Atrium: Right atrial size was normal in size. Pericardium: There is no evidence of pericardial effusion. Mitral Valve: The mitral valve has been repaired/replaced. No evidence of mitral valve regurgitation. There is a present in the mitral position. Procedure Date: 03/25/2016. MV peak gradient, 23.6 mmHg. The mean mitral valve gradient is 12.0 mmHg. Tricuspid Valve: The tricuspid valve is normal in structure. Tricuspid valve regurgitation is mild. Aortic Valve: The aortic valve is  tricuspid. Aortic valve regurgitation is not visualized. Pulmonic Valve: The pulmonic valve was normal in structure. Pulmonic valve regurgitation is mild. Aorta: The aortic root is normal in size and structure. There is mild (Grade II) plaque involving the descending aorta. IAS/Shunts: Evidence of atrial level shunting detected by color flow Doppler. A large patent foramen ovale is detected. Additional Comments: S/P MV repair; oscillating density associated with MV annulus likely suture from  previous repair; cannot exclude small vegetation; likely needs 6 weeks antibiotics to cover SBE; no MR; elevated mean gradient (12 mmHg).   AORTA Ao Root diam: 3.70 cm Ao Asc diam:  3.60 cm MITRAL VALVE MV Area (PHT): 2.10 cm MV Peak grad:  23.6 mmHg MV Mean grad:  12.0 mmHg MV Vmax:       2.43 m/s MV Vmean:      165.0 cm/s MV Decel Time: 362 msec MV E velocity: 210.00 cm/s MV A velocity: 187.00 cm/s MV E/A ratio:  1.12 Kirk Ruths MD Electronically signed by Kirk Ruths MD Signature Date/Time: 03/04/2021/2:55:58 PM    Final      Assessment/Plan: Enterococcal prosthetic valve endocarditis = plan for dual therapy with ampicillin and ceftriaxone. Will need teaching for daily infusion through home health coordinator. 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

## 2021-03-05 NOTE — Anesthesia Postprocedure Evaluation (Signed)
Anesthesia Post Note  Patient: [name]  Procedure(s) Performed: TRANSESOPHAGEAL ECHOCARDIOGRAM (TEE)     Patient location during evaluation: Endoscopy Anesthesia Type: MAC Level of consciousness: awake Pain management: pain level controlled Vital Signs Assessment: post-procedure vital signs reviewed and stable Respiratory status: spontaneous breathing, nonlabored ventilation, respiratory function stable and patient connected to nasal cannula oxygen Cardiovascular status: stable and blood pressure returned to baseline Postop Assessment: no apparent nausea or vomiting Anesthetic complications: no   No notable events documented.  Last Vitals:  Vitals:   03/05/21 0529 03/05/21 0530  BP:  124/65  Pulse: 61 62  Resp:  18  Temp:  36.9 C  SpO2: 97% 95%    Last Pain:  Vitals:   03/05/21 0625  TempSrc:   PainSc: 2                  [name]

## 2021-03-05 NOTE — TOC Progression Note (Signed)
Transition of Care [facility]) - Progression Note    Patient Details  Name: [name] MRN: [id number] Date of Birth: 1946/05/03  Transition of Care [facility]) CM/SW Contact  Ross Ludwig, [place] Phone Number: 03/05/2021, 4:33 PM  Clinical Narrative:     Patient will need IV ABX at home.  [facility] is aware and following, [facility], which clinicals have been sent for approval.  CSW continuing to follow patient's progress throughout discharge planning.  Expected Discharge Plan: [place] Barriers to Discharge: Continued Medical Work up  Expected Discharge Plan and Services Expected Discharge Plan: [place] In-house Referral: Clinical Social Work     Living arrangements for the past 2 months: Single Family Home                                       Social Determinants of Health (SDOH) Interventions    Readmission Risk Interventions No flowsheet data found.

## 2021-03-06 ENCOUNTER — Inpatient Hospital Stay (HOSPITAL_COMMUNITY): Payer: No Typology Code available for payment source

## 2021-03-06 ENCOUNTER — Encounter (HOSPITAL_COMMUNITY): Payer: Self-pay | Admitting: Interventional Radiology

## 2021-03-06 HISTORY — PX: IR PERC TUN PERIT CATH WO PORT S&I /IMAG: IMG2327

## 2021-03-06 LAB — BASIC METABOLIC PANEL
Anion gap: 3 — ABNORMAL LOW (ref 5–15)
BUN: 28 mg/dL — ABNORMAL HIGH (ref 8–23)
CO2: 26 mmol/L (ref 22–32)
Calcium: 9.2 mg/dL (ref 8.9–10.3)
Chloride: 106 mmol/L (ref 98–111)
Creatinine, Ser: 2.36 mg/dL — ABNORMAL HIGH (ref 0.61–1.24)
GFR, Estimated: 28 mL/min — ABNORMAL LOW (ref >60)
Glucose, Bld: 104 mg/dL — ABNORMAL HIGH (ref 70–99)
Potassium: 3.7 mmol/L (ref 3.5–5.1)
Sodium: 135 mmol/L (ref 135–145)

## 2021-03-06 LAB — CBC
HCT: 24.8 % — ABNORMAL LOW (ref 39.0–52.0)
Hemoglobin: 8.3 g/dL — ABNORMAL LOW (ref 13.0–17.0)
MCH: 34 pg (ref 26.0–34.0)
MCHC: 33.5 g/dL (ref 30.0–36.0)
MCV: 101.6 fL — ABNORMAL HIGH (ref 80.0–100.0)
Platelets: 292 10*3/uL (ref 150–400)
RBC: 2.44 MIL/uL — ABNORMAL LOW (ref 4.22–5.81)
RDW: 14.3 % (ref 11.5–15.5)
WBC: 7.5 10*3/uL (ref 4.0–10.5)
nRBC: 0 % (ref 0.0–0.2)

## 2021-03-06 LAB — GLUCOSE, CAPILLARY
Glucose-Capillary: 107 mg/dL — ABNORMAL HIGH (ref 70–99)
Glucose-Capillary: 150 mg/dL — ABNORMAL HIGH (ref 70–99)
Glucose-Capillary: 122 mg/dL — ABNORMAL HIGH (ref 70–99)
Glucose-Capillary: 225 mg/dL — ABNORMAL HIGH (ref 70–99)

## 2021-03-06 MED ORDER — LIDOCAINE-EPINEPHRINE (PF) 2 %-1:200000 IJ SOLN
INTRAMUSCULAR | Status: AC
  Filled 2021-03-06: qty 20

## 2021-03-06 MED ORDER — LIDOCAINE HCL 1 % IJ SOLN
INTRAMUSCULAR | Status: DC | PRN
  Administered 2021-03-06: 10 mL

## 2021-03-06 MED ORDER — SODIUM CHLORIDE 0.9% FLUSH
10.0000 mL | Freq: Two times a day (BID) | INTRAVENOUS | Status: DC
  Administered 2021-03-07: 10 mL

## 2021-03-06 MED ORDER — CHLORHEXIDINE GLUCONATE CLOTH 2 % EX PADS
6.0000 | MEDICATED_PAD | Freq: Every day | CUTANEOUS | Status: DC
  Administered 2021-03-06 – 2021-03-07 (×2): 6 via TOPICAL

## 2021-03-06 NOTE — Procedures (Signed)
Interventional Radiology Procedure Note  Procedure: RIJV Dual Lumen Powerline  Complications: None  Estimated Blood Loss: <69mL  Recommendations: Catheter OK for use.   Signed,  Michaelle Birks, MD

## 2021-03-06 NOTE — Progress Notes (Signed)
Physical Therapy Treatment Patient Details Name: [name] MRN: [id number] DOB: 01-06-1946 Today's Date: 03/06/2021    History of Present Illness Patient is a 75 y.o. male admitted for anemia. Past medical history significant for atrial fibrillation, CAD, chronic kidney disease stage IV, history of stroke last month, diabetes mellitus type 2, HFpEF, mitral valve repair 5 years ago, gout, OA    PT Comments    Pt assisted with ambulating in hallway and performed standing exercises.  Pt anticipates d/c home later today or tomorrow.  Pt encouraged to f/u with enquiring about right AFO.    Follow Up Recommendations  Outpatient PT     Equipment Recommendations  None recommended by PT    Recommendations for Other Services       Precautions / Restrictions Precautions Precautions: Fall Precaution Comments: Left knee area pain, R decreased DF    Mobility  Bed Mobility Overal bed mobility: Needs Assistance Bed Mobility: Supine to Sit     Supine to sit: Supervision;HOB elevated          Transfers Overall transfer level: Needs assistance Equipment used: Rolling walker (2 wheeled) Transfers: Sit to/from Stand Sit to Stand: Min assist         General transfer comment: increased time and effort, assist to rise and steady  Ambulation/Gait Ambulation/Gait assistance: Min guard Gait Distance (Feet): 400 Feet Assistive device: Rolling walker (2 wheeled) Gait Pattern/deviations: Decreased dorsiflexion - right;Step-through pattern     General Gait Details: increased right hip/knee flexion to compensate for decreased ankle DF, cues for RW positioning   Stairs             Wheelchair Mobility    Modified Rankin (Stroke Patients Only)       Balance                                            Cognition Arousal/Alertness: Awake/alert Behavior During Therapy: WFL for tasks assessed/performed Overall Cognitive Status: Within Functional Limits  for tasks assessed                                        Exercises General Exercises - Lower Extremity Hip ABduction/ADduction: AROM;Both;10 reps;Standing;Other (comment) (standing exercises holding RW, therapist blocked RW for safety, pt encouraged to use countertop at home for stability) Hip Flexion/Marching: AROM;Both;10 reps;Standing Mini-Sqauts: AROM;Both;10 reps;Standing (required UE assist, cues for form)    General Comments        Pertinent Vitals/Pain Pain Assessment: Faces Faces Pain Scale: Hurts a little bit Pain Location: back Pain Descriptors / Indicators: Sore;Aching Pain Intervention(s): Repositioned;Monitored during session    Home Living                      Prior Function            PT Goals (current goals can now be found in the care plan section) Progress towards PT goals: Progressing toward goals    Frequency    Min 3X/week      PT Plan Current plan remains appropriate    Co-evaluation              AM-PAC PT "6 Clicks" Mobility   Outcome Measure  Help needed turning from your back to your side while in a  flat bed without using bedrails?: A Little Help needed moving from lying on your back to sitting on the side of a flat bed without using bedrails?: A Little Help needed moving to and from a bed to a chair (including a wheelchair)?: A Little Help needed standing up from a chair using your arms (e.g., wheelchair or bedside chair)?: A Little Help needed to walk in hospital room?: A Little Help needed climbing 3-5 steps with a railing? : A Lot 6 Click Score: 17    End of Session Equipment Utilized During Treatment: Gait belt Activity Tolerance: Patient tolerated treatment well Patient left: in chair;with call bell/phone within reach;with chair alarm set   PT Visit Diagnosis: Other abnormalities of gait and mobility (R26.89)     Time: 7289-7915 PT Time Calculation (min) (ACUTE ONLY): 19 min  Charges:   $Gait Training: 8-22 mins                    Arlyce Dice, DPT Acute Rehabilitation Services Pager: [phone] Office: [place] 03/06/2021, 1:29 PM

## 2021-03-06 NOTE — Progress Notes (Signed)
[name]  TMH:[id number] DOB: 03-28-1946 DOA: 02/26/2021 PCP: [name], MD    Brief Narrative:  [phone] with a history of atrial fibrillation, CAD, CKD stage IV, DM 2, mitral valve repair, Gout, and a Stroke June 2022 who presented to the ED with severe generalized weakness and frequent falls at home along with intermittent fevers.  Significant Events:  His initial work-up revealed acute hyponatremia.  Blood cultures were found to be positive for Enterococcus faecalis.  7/12 TEE  Consultants:  Infectious Disease Cardiology  Code Status: FULL CODE  Antimicrobials:  Ampicillin 7/6 > Ceftriaxone 7/7 >  DVT prophylaxis: Eliquis  Subjective: Complains of severe left hip and left low back pain associated with attempts to ambulate.  This has been present earlier in the hospital stay but was exacerbated today when he worked with PT/OT.  Otherwise he has no complaints.  He states he feels quite well.  He is anxious to get home as soon as he can.  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 No evidence of osteomyelitis/discitis on MRI lumbar spine -TEE 03/04/2021 noted oscillating density on a repaired mitral valve likely related to suture but small vegetation cannot be ruled out - ID prescribed 6 weeks of ampicillin plus ceftriaxone and outpatient ID follow-up - IR consulted for PICC for long-term antibiotic therapy - should be ready for D/C home after PICC placed   Recurring L hip and low back pain MRI of lumbar spine did note signif degenerative change that may be the cause - will also check plain films of hip to be complete   Acute thromboembolic stroke posterior left lentiform nucleus -recent stroke June 2022 Continue Eliquis therapy  Acute hyponatremia Corrected with volume resuscitation  Left knee osteoarthritis with effusion Outpatient follow-up with Dr. Maureen Ralphs -synovial fluid culture no growth - improved on exam  Chronic  diastolic CHF Compensated at present  AKI on CKD stage IV Renal function now stable  Recent Labs  Lab 02/28/21 0817 03/01/21 0257 03/06/21 0317  CREATININE 2.73* 2.68* 2.36*    Chronic atrial fibrillation Continue usual amiodarone and Eliquis  Prolonged QT  Anemia of chronic disease Hemoglobin stable  DM2 CBG controlled  Generalized weakness with multiple falls at home Outpatient PT   Family Communication:  Status is: Inpatient  Remains inpatient appropriate because:Inpatient level of care appropriate due to severity of illness  Dispo: The patient is from: Home              Anticipated d/c is to: Home              Patient currently is not medically stable to d/c.   Difficult to place patient No    Objective: Blood pressure 133/62, pulse (!) 57, temperature 98.2 F (36.8 C), temperature source Oral, resp. rate 19, height 5\' 10"  (1.778 m), weight 87.3 kg, SpO2 99 %.  Intake/Output Summary (Last 24 hours) at 03/06/2021 0923 Last data filed at 03/06/2021 0805 Gross per 24 hour  Intake 1020.78 ml  Output 925 ml  Net 95.78 ml    Filed Weights   03/04/21 1322 03/05/21 0500 03/06/21 0600  Weight: 88 kg 87.1 kg 87.3 kg    Examination: General: No acute respiratory distress Lungs: CTA B - no wheezing  Cardiovascular: RRR - no M or rub  Abdomen: NT/ND, soft, bs+, no mass  Extremities: No signifi edema bilateral lower extremities  CBC: Recent Labs  Lab 03/01/21 0257 03/02/21 0313 03/03/21 0847 03/06/21 0606  WBC 8.8 7.6 8.6 7.5  NEUTROABS 6.6 5.5 6.4  --   HGB 8.8* 8.8* 9.9* 8.3*  HCT 25.8* 25.7* 29.3* 24.8*  MCV 98.1 98.1 99.0 101.6*  PLT 215 232 280 277    Basic Metabolic Panel: Recent Labs  Lab 02/28/21 0817 03/01/21 0257 03/06/21 0317  NA 130* 131* 135  K 4.2 3.7 3.7  CL 98 97* 106  CO2 24 24 26   GLUCOSE 121* 114* 104*  BUN 45* 45* 28*  CREATININE 2.73* 2.68* 2.36*  CALCIUM 9.4 9.1 9.2    GFR: Estimated Creatinine Clearance: 27.9  mL/min (A) (by C-G formula based on SCr of 2.36 mg/dL (H)).    HbA1C: Hgb A1c MFr Bld  Date/Time Value Ref Range Status  02/02/2021 02:14 AM 6.2 (H) 4.8 - 5.6 % Final    Comment:    (NOTE)         Prediabetes: 5.7 - 6.4         Diabetes: >6.4         Glycemic control for adults with diabetes: <7.0   01/07/2021 12:43 PM 6.3 (H) 4.8 - 5.6 % Final    Comment:    (NOTE) Pre diabetes:          5.7%-6.4%  Diabetes:              >6.4%  Glycemic control for   <7.0% adults with diabetes     CBG: Recent Labs  Lab 03/05/21 0730 03/05/21 1145 03/05/21 1647 03/05/21 2114 03/06/21 0804  GLUCAP 136* 173* 146* 135* 107*     Recent Results (from the past 240 hour(s))  Resp Panel by RT-PCR (Flu A&B, Covid) Nasopharyngeal Swab     Status: None   Collection Time: 02/26/21  2:36 AM   Specimen: Nasopharyngeal Swab; Nasopharyngeal(NP) swabs in vial transport medium  Result Value Ref Range Status   SARS Coronavirus 2 by RT PCR NEGATIVE NEGATIVE Final    Comment: (NOTE) SARS-CoV-2 target nucleic acids are NOT DETECTED.  The SARS-CoV-2 RNA is generally detectable in upper respiratory specimens during the acute phase of infection. The lowest concentration of SARS-CoV-2 viral copies this assay can detect is 138 copies/mL. A negative result does not preclude SARS-Cov-2 infection and should not be used as the sole basis for treatment or other patient management decisions. A negative result may occur with  improper specimen collection/handling, submission of specimen other than nasopharyngeal swab, presence of viral mutation(s) within the areas targeted by this assay, and inadequate number of viral copies(<138 copies/mL). A negative result must be combined with clinical observations, patient history, and epidemiological information. The expected result is Negative.  Fact Sheet for Patients:  EntrepreneurPulse.com.au  Fact Sheet for Healthcare Providers:   IncredibleEmployment.be  This test is no t yet approved or cleared by the [place] FDA and  has been authorized for detection and/or diagnosis of SARS-CoV-2 by FDA under an Emergency Use Authorization (EUA). This EUA will remain  in effect (meaning this test can be used) for the duration of the COVID-19 declaration under Section 564(b)(1) of the Act, 21 U.S.C.section 360bbb-3(b)(1), unless the authorization is terminated  or revoked sooner.       Influenza A by PCR NEGATIVE NEGATIVE Final   Influenza B by PCR NEGATIVE NEGATIVE Final    Comment: (NOTE) The Xpert Xpress SARS-CoV-2/FLU/RSV plus assay is intended as an aid in the diagnosis of influenza from Nasopharyngeal swab specimens and should not be used as a sole basis for treatment. Nasal washings  and aspirates are unacceptable for Xpert Xpress SARS-CoV-2/FLU/RSV testing.  Fact Sheet for Patients: EntrepreneurPulse.com.au  Fact Sheet for Healthcare Providers: IncredibleEmployment.be  This test is not yet approved or cleared by the [place] FDA and has been authorized for detection and/or diagnosis of SARS-CoV-2 by FDA under an Emergency Use Authorization (EUA). This EUA will remain in effect (meaning this test can be used) for the duration of the COVID-19 declaration under Section 564(b)(1) of the Act, 21 U.S.C. section 360bbb-3(b)(1), unless the authorization is terminated or revoked.  Performed at [facility], [place] [address] [place], [place] [address]   Blood Culture (routine x 2)     Status: Abnormal   Collection Time: 02/26/21  2:36 AM   Specimen: BLOOD  Result Value Ref Range Status   Specimen Description   Final    BLOOD SPECIMEN SOURCE NOT MARKED ON REQUISITION Performed at [facility] [address], [place], [place] [address]    Special Requests   Final    BOTTLES DRAWN AEROBIC AND ANAEROBIC Blood  Culture adequate volume Performed at [facility] [address] [place], [place] [address]    Culture  Setup Time   Final    GRAM POSITIVE COCCI IN CHAINS IN BOTH AEROBIC AND ANAEROBIC BOTTLES Organism ID to follow CRITICAL RESULT CALLED TO, READ BACK BY AND VERIFIED WITHCristopher Estimable PHARMD 1902 02/26/21 A BROWNING Performed at [facility], [place] [address], [place], [place] [address]    Culture ENTEROCOCCUS FAECALIS (A)  Final   Report Status 02/28/2021 FINAL  Final   Organism ID, Bacteria ENTEROCOCCUS FAECALIS  Final      Susceptibility   Enterococcus faecalis - MIC*    AMPICILLIN <=2 SENSITIVE Sensitive     VANCOMYCIN 1 SENSITIVE Sensitive     GENTAMICIN SYNERGY SENSITIVE Sensitive     * ENTEROCOCCUS FAECALIS  Urine culture     Status: Abnormal   Collection Time: 02/26/21  2:36 AM   Specimen: In/Out Cath Urine  Result Value Ref Range Status   Specimen Description   Final    IN/OUT CATH URINE Performed at [facility] [address], [place], [place] [address]    Special Requests   Final    NONE Performed at [facility], [place] [address] [place], [place] [address]    Culture (A)  Final    <10,000 COLONIES/mL INSIGNIFICANT GROWTH Performed at [facility] [address] [place], [place] [address]    Report Status 02/27/2021 FINAL  Final  Blood Culture ID Panel (Reflexed)     Status: Abnormal   Collection Time: 02/26/21  2:36 AM  Result Value Ref Range Status   Enterococcus faecalis DETECTED (A) NOT DETECTED Final    Comment: CRITICAL RESULT CALLED TO, READ BACK BY AND VERIFIED WITH: E WILLIAMSON PHARMD 1902 02/26/21 A BROWNING    Enterococcus Faecium NOT DETECTED NOT DETECTED Final   Listeria monocytogenes NOT DETECTED NOT DETECTED Final   Staphylococcus species NOT DETECTED NOT DETECTED Final   Staphylococcus aureus (BCID) NOT DETECTED NOT DETECTED Final   Staphylococcus epidermidis NOT DETECTED  NOT DETECTED Final   Staphylococcus lugdunensis NOT DETECTED NOT DETECTED Final   Streptococcus species NOT DETECTED NOT DETECTED Final   Streptococcus agalactiae NOT DETECTED NOT DETECTED Final   Streptococcus pneumoniae NOT DETECTED NOT DETECTED Final   Streptococcus pyogenes NOT DETECTED NOT DETECTED Final   A.calcoaceticus-baumannii NOT DETECTED NOT DETECTED Final  Bacteroides fragilis NOT DETECTED NOT DETECTED Final   Enterobacterales NOT DETECTED NOT DETECTED Final   Enterobacter cloacae complex NOT DETECTED NOT DETECTED Final   Escherichia coli NOT DETECTED NOT DETECTED Final   Klebsiella aerogenes NOT DETECTED NOT DETECTED Final   Klebsiella oxytoca NOT DETECTED NOT DETECTED Final   Klebsiella pneumoniae NOT DETECTED NOT DETECTED Final   Proteus species NOT DETECTED NOT DETECTED Final   Salmonella species NOT DETECTED NOT DETECTED Final   Serratia marcescens NOT DETECTED NOT DETECTED Final   Haemophilus influenzae NOT DETECTED NOT DETECTED Final   Neisseria meningitidis NOT DETECTED NOT DETECTED Final   Pseudomonas aeruginosa NOT DETECTED NOT DETECTED Final   Stenotrophomonas maltophilia NOT DETECTED NOT DETECTED Final   Candida albicans NOT DETECTED NOT DETECTED Final   Candida auris NOT DETECTED NOT DETECTED Final   Candida glabrata NOT DETECTED NOT DETECTED Final   Candida krusei NOT DETECTED NOT DETECTED Final   Candida parapsilosis NOT DETECTED NOT DETECTED Final   Candida tropicalis NOT DETECTED NOT DETECTED Final   Cryptococcus neoformans/gattii NOT DETECTED NOT DETECTED Final   Vancomycin resistance NOT DETECTED NOT DETECTED Final    Comment: Performed at [facility], [place] [address] [place], [place] [address]  Culture, blood (Routine X 2) w Reflex to ID Panel     Status: None   Collection Time: 02/28/21  4:11 PM   Specimen: BLOOD  Result Value Ref Range Status   Specimen Description   Final    BLOOD RIGHT ANTECUBITAL Performed at [facility] [address], [place], [place] [address]    Special Requests   Final    BOTTLES DRAWN AEROBIC AND ANAEROBIC Blood Culture adequate volume Performed at [facility] [address], [place], [place] [address]    Culture   Final    NO GROWTH 5 DAYS Performed at [facility], [place] [address] [place], [place] [address]    Report Status 03/05/2021 FINAL  Final  Culture, blood (Routine X 2) w Reflex to ID Panel     Status: None   Collection Time: 02/28/21  4:22 PM   Specimen: BLOOD RIGHT HAND  Result Value Ref Range Status   Specimen Description   Final    BLOOD RIGHT HAND Performed at [facility] [address] [place], [place] [address]    Special Requests   Final    BOTTLES DRAWN AEROBIC AND ANAEROBIC Blood Culture adequate volume Performed at [facility] [address] [place], [place] [address]    Culture   Final    NO GROWTH 5 DAYS Performed at [facility], [place] [address], [place], [place] [address]    Report Status 03/05/2021 FINAL  Final  Body fluid culture w Gram Stain     Status: None   Collection Time: 03/01/21  6:31 PM   Specimen: Synovium; Body Fluid  Result Value Ref Range Status   Specimen Description   Final    SYNOVIAL LEFT KNEE Performed at [facility] [address] [place],  [address]    Special Requests   Final    Normal Performed at [facility], [place] [address] [place],  [address]    Gram Stain   Final    ABUNDANT WBC PRESENT, PREDOMINANTLY PMN NO ORGANISMS SEEN    Culture   Final    NO GROWTH 3 DAYS Performed at [facility], [address] [place], [place] [address]    Report Status 03/05/2021 FINAL  Final      Scheduled Meds:  amiodarone  200 mg Oral Daily   apixaban  5 mg Oral BID   calcitRIOL  0.25 mcg Oral QODAY   chlorhexidine  15 mL Mouth Rinse BID   insulin aspart  0-5 Units  Subcutaneous QHS   insulin aspart  0-9 Units Subcutaneous TID WC   lidocaine  1 patch Transdermal Q24H   loratadine  10 mg Oral Daily   mouth rinse  15 mL Mouth Rinse q12n4p   traZODone  100 mg Oral QHS   Continuous Infusions:  sodium chloride Stopped (02/27/21 1600)   sodium chloride Stopped (03/06/21 0811)   ampicillin (OMNIPEN) IV 2 g (03/06/21 0454)   cefTRIAXone (ROCEPHIN)  IV 2 g (03/06/21 0902)     LOS: 8 days   [name], MD [facility] Office  [phone] Pager - Text Page per Shea Evans  If 7PM-7AM, please contact night-coverage per Amion 03/06/2021, 9:23 AM

## 2021-03-06 NOTE — Care Management Important Message (Signed)
Important Message  Patient Details IM Letter given to the Patient. Name: [name] MRN: [id number] Date of Birth: 07/07/1946   Medicare Important Message Given:  Yes     Kerin Salen 03/06/2021, 2:35 PM

## 2021-03-06 NOTE — TOC Progression Note (Signed)
Transition of Care [facility]) - Progression Note    Patient Details  Name: [name] MRN: [id number] Date of Birth: 1946/04/01  Transition of Care [facility] CM/SW Contact  Ross Ludwig, [place] Phone Number: 03/06/2021, 5:23 PM  Clinical Narrative:     CSW spoke with [facility] at [facility].  She will be meeting with patient's wife tomorrow to complete teaching.  Patient will be using [facility] for [facility] RN.  CSW to continue to follow patient's progress throughout discharge planning.   Expected Discharge Plan: [place] Barriers to Discharge: Continued Medical Work up  Expected Discharge Plan and Services Expected Discharge Plan: [place] In-house Referral: Clinical Social Work     Living arrangements for the past 2 months: Single Family Home                                       Social Determinants of Health (SDOH) Interventions    Readmission Risk Interventions No flowsheet data found.

## 2021-03-07 ENCOUNTER — Ambulatory Visit: Payer: Self-pay | Admitting: *Deleted

## 2021-03-07 DIAGNOSIS — T826XXA Infection and inflammatory reaction due to cardiac valve prosthesis, initial encounter: Secondary | ICD-10-CM | POA: Diagnosis present

## 2021-03-07 DIAGNOSIS — I38 Endocarditis, valve unspecified: Secondary | ICD-10-CM | POA: Diagnosis present

## 2021-03-07 DIAGNOSIS — B952 Enterococcus as the cause of diseases classified elsewhere: Secondary | ICD-10-CM | POA: Diagnosis present

## 2021-03-07 DIAGNOSIS — Z8673 Personal history of transient ischemic attack (TIA), and cerebral infarction without residual deficits: Secondary | ICD-10-CM

## 2021-03-07 DIAGNOSIS — T07XXXA Unspecified multiple injuries, initial encounter: Secondary | ICD-10-CM | POA: Diagnosis present

## 2021-03-07 DIAGNOSIS — M25462 Effusion, left knee: Secondary | ICD-10-CM

## 2021-03-07 LAB — CBC
HCT: 25.3 % — ABNORMAL LOW (ref 39.0–52.0)
Hemoglobin: 8.2 g/dL — ABNORMAL LOW (ref 13.0–17.0)
MCH: 32.5 pg (ref 26.0–34.0)
MCHC: 32.4 g/dL (ref 30.0–36.0)
MCV: 100.4 fL — ABNORMAL HIGH (ref 80.0–100.0)
Platelets: 317 10*3/uL (ref 150–400)
RBC: 2.52 MIL/uL — ABNORMAL LOW (ref 4.22–5.81)
RDW: 14.5 % (ref 11.5–15.5)
WBC: 6.7 10*3/uL (ref 4.0–10.5)
nRBC: 0 % (ref 0.0–0.2)

## 2021-03-07 LAB — GLUCOSE, CAPILLARY
Glucose-Capillary: 135 mg/dL — ABNORMAL HIGH (ref 70–99)
Glucose-Capillary: 125 mg/dL — ABNORMAL HIGH (ref 70–99)

## 2021-03-07 LAB — FOLATE: Folate: 8.7 ng/mL (ref >5.9)

## 2021-03-07 LAB — VITAMIN B12: Vitamin B-12: 905 pg/mL (ref 180–914)

## 2021-03-07 MED ORDER — AMPICILLIN IV (FOR PTA / DISCHARGE USE ONLY): 2.0000 g | Freq: Three times a day (TID) | INTRAVENOUS | 0 refills | Status: AC

## 2021-03-07 MED ORDER — ACETAMINOPHEN 325 MG PO TABS: 650.0000 mg | ORAL_TABLET | Freq: Four times a day (QID) | ORAL | Status: DC | PRN

## 2021-03-07 MED ORDER — CEFTRIAXONE IV (FOR PTA / DISCHARGE USE ONLY): 2.0000 g | Freq: Two times a day (BID) | INTRAVENOUS | 0 refills | Status: AC

## 2021-03-07 MED ORDER — HYDROCODONE-ACETAMINOPHEN 5-325 MG PO TABS: 1.0000 | ORAL_TABLET | ORAL | 0 refills | Status: DC | PRN

## 2021-03-07 NOTE — TOC Transition Note (Signed)
Transition of Care [facility]) - CM/SW Discharge Note   Patient Details  Name: [name] MRN: [id number] Date of Birth: May 09, 1946  [facility] CM/SW Contact:  Ross Ludwig, LCSW Phone Number: 03/07/2021, 12:57 PM   Clinical Narrative:     Patient will be going home with home health through [facility] and also home IV antibiotics through [facility].  CSW signing off please reconsult with any other social work needs, home health agency has been notified of planned discharge.    Final next level of care: [place] Barriers to Discharge: Barriers Resolved   Patient Goals and CMS Choice Patient states their goals for this hospitalization and ongoing recovery are:: To return home with home health. CMS Medicare.gov Compare Post Acute Care list provided to:: Patient Choice offered to / list presented to : Patient  Discharge Placement  Patient discharging back home with home health and home IV antibiotics.                     Discharge Plan and Services In-house Referral: Clinical Social Work              DME Arranged: IV pump/equipment DME Agency: Other - Comment ([facility]) Date DME Agency Contacted: 03/07/21 Time DME Agency Contacted: [id number] Representative spoke with at DME Agency: Carolynn Sayers HH Arranged: RN [place] Agency: [place] ([place]) Date [place]: 03/06/21 Time [place]: [phone] Representative spoke with at [place]: [place] Determinants of Health ([place]) Interventions     Readmission Risk Interventions No flowsheet data found.

## 2021-03-07 NOTE — Discharge Summary (Signed)
DISCHARGE SUMMARY  [name]  MR#: [id number]  DOB:12/31/1945  Date of Admission: 02/26/2021 Date of Discharge: 03/07/2021  Attending Physician:Danila Eddie Hennie Duos, MD  Patient's TKZ:SWFU, Jason Luo, MD  Consults: Infectious Disease Cardiology  Disposition: D/C home with [facility] Abx tx   Follow-up Appts:  Follow-up Information     [name], MD Follow up in 1 week(s).   Specialty: Family Medicine Contact information: [place] [address] [phone]         [facility]              Follow up.   Why: The clinic will contact you to schedule an appointment. Contact information: [address]  [place] [phone]        Gaynelle Arabian, MD. Schedule an appointment as soon as possible for a visit in 1 month(s).   Specialty: Orthopedic Surgery Why: For a check up on your Left knee as needed. Contact information: 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  Discharge Diagnoses: Enterococcus faecalis bacteremia Prosthetic valve endocarditis Recurring L hip and low back pain Acute thromboembolic stroke posterior left lentiform nucleus -recent stroke June 2022 Acute hyponatremia Left knee osteoarthritis with effusion Chronic diastolic CHF AKI on CKD stage IV Chronic atrial fibrillation Prolonged QT Anemia of chronic disease DM2 Generalized weakness with multiple falls at home  Initial presentation: 74yo with a history of Atrial Fibrillation, CAD, CKD stage IV, DM 2, Mitral Valve Repair, Gout, and a Stroke June 2022 who presented to the ED with severe generalized weakness and frequent falls at home along with intermittent fevers. His initial  work-up revealed acute hyponatremia.  Blood cultures were found to be positive for Enterococcus faecalis.  Hospital Course:  Enterococcus faecalis bacteremia - Prosthetic valve endocarditis No evidence of osteomyelitis/discitis on MRI lumbar spine -TEE 03/04/2021 noted oscillating density on a repaired mitral valve likely related to suture but small vegetation cannot be ruled out - ID prescribed 6 weeks of ampicillin plus ceftriaxone and outpatient ID follow-up - IR consulted for PICC for long-term antibiotic therapy, which was placed 7/14 - HH abx arranged and to be followed by ID Service    Recurring L hip and low back pain MRI of lumbar spine did note signif degenerative change that may be the cause - plain films of L hip w/o acute abnormality     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 Leta Baptist as outpt to repeat EMG/NCS    Acute hyponatremia Corrected with volume resuscitation   Left knee osteoarthritis with effusion Outpatient follow-up with Dr. 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 Noted during June hospitalization - will need outpatient follow-up   Allergies as of 03/07/2021       Reactions   Crestor [rosuvastatin] Other (See Comments)   Muscle aches   Lipitor [atorvastatin] Other (See Comments)   Muscle aches    Pravastatin Other (See Comments)   Muscle aches   Carvedilol    Other reaction(s): loss of appetite   Metformin Diarrhea   Serotonin Reuptake Inhibitors (ssris)    Other reaction(s): Muscle aches   Zocor [simvastatin]    Other reaction(s): Muscle pain   Codeine Itching, Other (See Comments)   Extremity tingling-- can take synthetic        Medication List     TAKE these medications    acetaminophen 325 MG tablet Commonly known as: TYLENOL Take 2 tablets (650 mg total) by mouth every 6 (six) hours as needed for mild pain (or Fever >/= 101).   allopurinol 100 MG tablet Commonly known as: ZYLOPRIM Take 0.5 tablets (50 mg total) by mouth 2 (two) times a week. Please discuss dosing with your PCP based on your renal function. What changed: additional instructions   amiodarone 200 MG tablet Commonly known as: PACERONE Take 1 tablet (200 mg total) by mouth daily.   ampicillin  IVPB Inject 2 g into the vein every 8 (eight) hours. Indication:  Enterococcal endocarditis  First Dose: Yes Last Day of Therapy:  04/11/21  Labs - Once weekly:  CBC/D and BMP, Labs - Every other week:  ESR and CRP Method of administration: Ambulatory Pump (Continuous Infusion) Method of administration may be changed at the discretion of home infusion pharmacist based upon assessment of the patient and/or caregiver's ability to self-administer the medication ordered.   apixaban 5 MG Tabs tablet Commonly known as: Eliquis Take 1 tablet (5 mg total) by mouth 2 (two) times daily.   aspirin 81 MG EC tablet Take 1 tablet (81 mg total) by mouth daily. Swallow whole.   calcitRIOL 0.25 MCG capsule Commonly known as: ROCALTROL Take 0.25 mcg by mouth every other day.   cefTRIAXone  IVPB Commonly known as: ROCEPHIN Inject 2 g into the vein every 12 (twelve) hours. Indication:  Enterococcal endocarditis First Dose: Yes Last Day of Therapy:  04/11/21 Labs - Once weekly:  CBC/D and BMP, Labs - Every other  week:  ESR and CRP Method of administration: IV Push Method of administration may be changed at the discretion of home infusion pharmacist based upon assessment of the patient and/or caregiver's ability to self-administer the medication ordered.   cetirizine 10 MG tablet Commonly known as: ZYRTEC Take 10 mg by mouth daily.   colchicine 0.6 MG tablet Take 0.6-1.2 mg by mouth 2 (two) times daily as needed (gout).   fluticasone 50 MCG/ACT nasal spray Commonly known as: FLONASE Place 1 spray into both nostrils daily as needed for allergies or rhinitis.   HYDROcodone-acetaminophen 5-325 MG tablet Commonly known as: NORCO/VICODIN Take 1 tablet by mouth daily as needed for severe pain.   nitroGLYCERIN 0.4 MG SL tablet Commonly known as: NITROSTAT 1 TAB UNDER THE TONGUE EVERY 5 MINUTES AS NEEDED FOR CHEST PAIN UP TO 3 DOSES, IF PERSIST CALL 911   Ozempic (0.25 or 0.5 MG/DOSE) 2 MG/1.5ML Sopn Generic drug: Semaglutide(0.25 or 0.5MG/DOS) Inject 0.375 mLs (0.5 mg total) into the skin every Thursday. What changed:  when to take this additional instructions  pantoprazole 40 MG tablet Commonly known as: PROTONIX Take 40 mg by mouth daily as needed (heartburn).   Praluent 75 MG/ML Soaj Generic drug: Alirocumab Inject 75 mg into the skin every 14 (fourteen) days.   tadalafil 20 MG tablet Commonly known as: CIALIS TAKE 1 TABLET BY MOUTH ONCE DAILY AS NEEDED   traZODone 100 MG tablet Commonly known as: DESYREL Take 100 mg by mouth at bedtime.   vitamin C with rose hips 1000 MG tablet Take 1,000 mg by mouth daily.               Discharge Care Instructions  (From admission, onward)           Start     Ordered   03/07/21 0000  Change dressing on IV access line weekly and PRN  (Home infusion instructions - [facility] )        03/07/21 0827            Day of Discharge BP 116/65 (BP Location: Right Arm)   Pulse 66   Temp 99.1 F (37.3 C) (Oral)    Resp 16   Ht 5' 10"  (1.778 m)   Wt 86.7 kg   SpO2 99%   BMI 27.43 kg/m   Physical Exam: General: No acute respiratory distress Lungs: Clear to auscultation bilaterally without wheezes or crackles Cardiovascular: Regular rate and rhythm without murmur gallop or rub normal S1 and S2 Abdomen: Nontender, nondistended, soft, bowel sounds positive, no rebound, no ascites, no appreciable mass Extremities: No significant cyanosis, clubbing, or edema bilateral lower extremities  Basic Metabolic Panel: Recent Labs  Lab 03/01/21 0257 03/06/21 0317  NA 131* 135  K 3.7 3.7  CL 97* 106  CO2 24 26  GLUCOSE 114* 104*  BUN 45* 28*  CREATININE 2.68* 2.36*  CALCIUM 9.1 9.2    CBC: Recent Labs  Lab 03/01/21 0257 03/02/21 0313 03/03/21 0847 03/06/21 0606 03/07/21 0436  WBC 8.8 7.6 8.6 7.5 6.7  NEUTROABS 6.6 5.5 6.4  --   --   HGB 8.8* 8.8* 9.9* 8.3* 8.2*  HCT 25.8* 25.7* 29.3* 24.8* 25.3*  MCV 98.1 98.1 99.0 101.6* 100.4*  PLT 215 232 280 292 317     BNP (last 3 results) Recent Labs    10/04/20 1331 02/26/21 0901  BNP 120.8* 762.7*      CBG: Recent Labs  Lab 03/06/21 1147 03/06/21 1743 03/06/21 2127 03/07/21 0748 03/07/21 1219  GLUCAP 150* 122* 225* 135* 125*    Recent Results (from the past 240 hour(s))  Resp Panel by RT-PCR (Flu A&B, Covid) Nasopharyngeal Swab     Status: None   Collection Time: 02/26/21  2:36 AM   Specimen: Nasopharyngeal Swab; Nasopharyngeal(NP) swabs in vial transport medium  Result Value Ref Range Status   SARS Coronavirus 2 by RT PCR NEGATIVE NEGATIVE Final    Comment: (NOTE) SARS-CoV-2 target nucleic acids are NOT DETECTED.  The SARS-CoV-2 RNA is generally detectable in upper respiratory specimens during the acute phase of infection. The lowest concentration of SARS-CoV-2 viral copies this assay can detect is 138 copies/mL. A negative result does not preclude SARS-Cov-2 infection and should not be used as the sole basis for treatment  or other patient management decisions. A negative result may occur with  improper specimen collection/handling, submission of specimen other than nasopharyngeal swab, presence of viral mutation(s) within the areas targeted by this assay, and inadequate number of viral copies(<138 copies/mL). A negative result must be combined with clinical  observations, patient history, and epidemiological information. The expected result is Negative.  Fact Sheet for Patients:  EntrepreneurPulse.com.au  Fact Sheet for Healthcare Providers:  IncredibleEmployment.be  This test is no t yet approved or cleared by the [place] FDA and  has been authorized for detection and/or diagnosis of SARS-CoV-2 by FDA under an Emergency Use Authorization (EUA). This EUA will remain  in effect (meaning this test can be used) for the duration of the COVID-19 declaration under Section 564(b)(1) of the Act, 21 U.S.C.section 360bbb-3(b)(1), unless the authorization is terminated  or revoked sooner.       Influenza A by PCR NEGATIVE NEGATIVE Final   Influenza B by PCR NEGATIVE NEGATIVE Final    Comment: (NOTE) The Xpert Xpress SARS-CoV-2/FLU/RSV plus assay is intended as an aid in the diagnosis of influenza from Nasopharyngeal swab specimens and should not be used as a sole basis for treatment. Nasal washings and aspirates are unacceptable for Xpert Xpress SARS-CoV-2/FLU/RSV testing.  Fact Sheet for Patients: EntrepreneurPulse.com.au  Fact Sheet for Healthcare Providers: IncredibleEmployment.be  This test is not yet approved or cleared by the [place] FDA and has been authorized for detection and/or diagnosis of SARS-CoV-2 by FDA under an Emergency Use Authorization (EUA). This EUA will remain in effect (meaning this test can be used) for the duration of the COVID-19 declaration under Section 564(b)(1) of the Act, 21 U.S.C. section  360bbb-3(b)(1), unless the authorization is terminated or revoked.  Performed at [facility], [place] [address] [place], [place] [address]   Blood Culture (routine x 2)     Status: Abnormal   Collection Time: 02/26/21  2:36 AM   Specimen: BLOOD  Result Value Ref Range Status   Specimen Description   Final    BLOOD SPECIMEN SOURCE NOT MARKED ON REQUISITION Performed at [place] [address] [place], [place] [address]    Special Requests   Final    BOTTLES DRAWN AEROBIC AND ANAEROBIC Blood Culture adequate volume Performed at [facility] [address], [place], [place] [address]    Culture  Setup Time   Final    GRAM POSITIVE COCCI IN CHAINS IN BOTH AEROBIC AND ANAEROBIC BOTTLES Organism ID to follow CRITICAL RESULT CALLED TO, READ BACK BY AND VERIFIED WITHCristopher Estimable PHARMD 1902 02/26/21 A BROWNING Performed at [facility], [place] [address] [place], [place] [address]    Culture ENTEROCOCCUS FAECALIS (A)  Final   Report Status 02/28/2021 FINAL  Final   Organism ID, Bacteria ENTEROCOCCUS FAECALIS  Final      Susceptibility   Enterococcus faecalis - MIC*    AMPICILLIN <=2 SENSITIVE Sensitive     VANCOMYCIN 1 SENSITIVE Sensitive     GENTAMICIN SYNERGY SENSITIVE Sensitive     * ENTEROCOCCUS FAECALIS  Urine culture     Status: Abnormal   Collection Time: 02/26/21  2:36 AM   Specimen: In/Out Cath Urine  Result Value Ref Range Status   Specimen Description   Final    IN/OUT CATH URINE Performed at [facility] [address] [place], [place] [address]    Special Requests   Final    NONE Performed at [facility], [place] [address], [place], [place] [address]    Culture (A)  Final    <10,000 COLONIES/mL INSIGNIFICANT GROWTH Performed at [place] [address] [place], [place] [address]    Report Status 02/27/2021 FINAL  Final  Blood Culture ID Panel  (Reflexed)     Status: Abnormal   Collection Time: 02/26/21  2:36 AM  Result Value Ref Range Status   Enterococcus faecalis DETECTED (A) NOT DETECTED Final    Comment: CRITICAL RESULT CALLED TO, READ BACK BY AND VERIFIED WITH: E WILLIAMSON PHARMD 1902 02/26/21 A BROWNING    Enterococcus Faecium NOT DETECTED NOT DETECTED Final   Listeria monocytogenes NOT DETECTED NOT DETECTED Final   Staphylococcus species NOT DETECTED NOT DETECTED Final   Staphylococcus aureus (BCID) NOT DETECTED NOT DETECTED Final   Staphylococcus epidermidis NOT DETECTED NOT DETECTED Final   Staphylococcus lugdunensis NOT DETECTED NOT DETECTED Final   Streptococcus species NOT DETECTED NOT DETECTED Final   Streptococcus agalactiae NOT DETECTED NOT DETECTED Final   Streptococcus pneumoniae NOT DETECTED NOT DETECTED Final   Streptococcus pyogenes NOT DETECTED NOT DETECTED Final   A.calcoaceticus-baumannii NOT DETECTED NOT DETECTED Final   Bacteroides fragilis NOT DETECTED NOT DETECTED Final   Enterobacterales NOT DETECTED NOT DETECTED Final   Enterobacter cloacae complex NOT DETECTED NOT DETECTED Final   Escherichia coli NOT DETECTED NOT DETECTED Final   Klebsiella aerogenes NOT DETECTED NOT DETECTED Final   Klebsiella oxytoca NOT DETECTED NOT DETECTED Final   Klebsiella pneumoniae NOT DETECTED NOT DETECTED Final   Proteus species NOT DETECTED NOT DETECTED Final   Salmonella species NOT DETECTED NOT DETECTED Final   Serratia marcescens NOT DETECTED NOT DETECTED Final   Haemophilus influenzae NOT DETECTED NOT DETECTED Final   Neisseria meningitidis NOT DETECTED NOT DETECTED Final   Pseudomonas aeruginosa NOT DETECTED NOT DETECTED Final   Stenotrophomonas maltophilia NOT DETECTED NOT DETECTED Final   Candida albicans NOT DETECTED NOT DETECTED Final   Candida auris NOT DETECTED NOT DETECTED Final   Candida glabrata NOT DETECTED NOT DETECTED Final   Candida krusei NOT DETECTED NOT DETECTED Final   Candida parapsilosis  NOT DETECTED NOT DETECTED Final   Candida tropicalis NOT DETECTED NOT DETECTED Final   Cryptococcus neoformans/gattii NOT DETECTED NOT DETECTED Final   Vancomycin resistance NOT DETECTED NOT DETECTED Final    Comment: Performed at [facility], [address] [place], [place] [address]  Culture, blood (Routine X 2) w Reflex to ID Panel     Status: None   Collection Time: 02/28/21  4:11 PM   Specimen: BLOOD  Result Value Ref Range Status   Specimen Description   Final    BLOOD RIGHT ANTECUBITAL Performed at [place] [address], [place], [place] [address]    Special Requests   Final    BOTTLES DRAWN AEROBIC AND ANAEROBIC Blood Culture adequate volume Performed at [place] [address], [place], [place] [address]    Culture   Final    NO GROWTH 5 DAYS Performed at [facility], [place] [address] [place], [place] [address]    Report Status 03/05/2021 FINAL  Final  Culture, blood (Routine X 2) w Reflex to ID Panel     Status: None   Collection Time: 02/28/21  4:22 PM   Specimen: BLOOD RIGHT HAND  Result Value Ref Range Status   Specimen Description   Final    BLOOD RIGHT HAND Performed at [place] [address], [place], [place] [address]    Special Requests   Final    BOTTLES DRAWN AEROBIC AND ANAEROBIC Blood Culture adequate volume Performed at [place] [address], [place], [place] [address]  Culture   Final    NO GROWTH 5 DAYS Performed at [facility], [place] [address] [place], [place] [address]    Report Status 03/05/2021 FINAL  Final  Body fluid culture w Gram Stain     Status: None   Collection Time: 03/01/21  6:31 PM   Specimen: Synovium; Body Fluid  Result Value Ref Range Status   Specimen Description   Final    SYNOVIAL LEFT KNEE Performed at [facility] [address], [place], [place] [address]    Special Requests    Final    Normal Performed at [facility], [place] [address], [place], [place] [address]    Gram Stain   Final    ABUNDANT WBC PRESENT, PREDOMINANTLY PMN NO ORGANISMS SEEN    Culture   Final    NO GROWTH 3 DAYS Performed at [facility], [place] [address] [place], [place] [address]    Report Status 03/05/2021 FINAL  Final      Time spent in discharge (includes decision making & examination of pt): 35 minutes  03/07/2021, 2:26 PM   [name], MD [facility] Office  [phone]

## 2021-03-10 ENCOUNTER — Other Ambulatory Visit: Payer: Self-pay | Admitting: *Deleted

## 2021-03-10 NOTE — Patient Outreach (Signed)
[facility]) Care Management  03/10/2021  [name] [birth year] [id number]   Notified member was discharged home after admission 7/6-7/15 for bacteremia.  Call placed to member's listed home and mobile numbers, HIPAA compliant voice messages left.  Will follow up within the next 3-4 business days.  Valente David, [place], MSN [place] [phone]

## 2021-03-11 ENCOUNTER — Other Ambulatory Visit: Payer: Self-pay

## 2021-03-11 ENCOUNTER — Encounter (HOSPITAL_COMMUNITY): Payer: Self-pay

## 2021-03-11 ENCOUNTER — Telehealth: Payer: Self-pay

## 2021-03-11 ENCOUNTER — Ambulatory Visit (HOSPITAL_COMMUNITY)
Admission: RE | Admit: 2021-03-11 | Discharge: 2021-03-11 | Disposition: A | Discharge status: Home / Self Care | Payer: Medicare Other | Source: Ambulatory Visit | Attending: Cardiology | Admitting: Cardiology

## 2021-03-11 VITALS — BP 114/70 | HR 70 | Wt 182.4 lb

## 2021-03-11 DIAGNOSIS — I5032 Chronic diastolic (congestive) heart failure: Secondary | ICD-10-CM | POA: Insufficient documentation

## 2021-03-11 DIAGNOSIS — E669 Obesity, unspecified: Secondary | ICD-10-CM | POA: Diagnosis not present

## 2021-03-11 DIAGNOSIS — I38 Endocarditis, valve unspecified: Secondary | ICD-10-CM

## 2021-03-11 DIAGNOSIS — Z9884 Bariatric surgery status: Secondary | ICD-10-CM | POA: Diagnosis not present

## 2021-03-11 DIAGNOSIS — T826XXA Infection and inflammatory reaction due to cardiac valve prosthesis, initial encounter: Secondary | ICD-10-CM | POA: Diagnosis not present

## 2021-03-11 DIAGNOSIS — I350 Nonrheumatic aortic (valve) stenosis: Secondary | ICD-10-CM | POA: Diagnosis not present

## 2021-03-11 DIAGNOSIS — I252 Old myocardial infarction: Secondary | ICD-10-CM | POA: Diagnosis not present

## 2021-03-11 DIAGNOSIS — I480 Paroxysmal atrial fibrillation: Secondary | ICD-10-CM | POA: Diagnosis not present

## 2021-03-11 DIAGNOSIS — E785 Hyperlipidemia, unspecified: Secondary | ICD-10-CM | POA: Insufficient documentation

## 2021-03-11 DIAGNOSIS — Q211 Atrial septal defect: Secondary | ICD-10-CM | POA: Insufficient documentation

## 2021-03-11 DIAGNOSIS — Z8673 Personal history of transient ischemic attack (TIA), and cerebral infarction without residual deficits: Secondary | ICD-10-CM | POA: Insufficient documentation

## 2021-03-11 DIAGNOSIS — X58XXXA Exposure to other specified factors, initial encounter: Secondary | ICD-10-CM | POA: Insufficient documentation

## 2021-03-11 DIAGNOSIS — Z79899 Other long term (current) drug therapy: Secondary | ICD-10-CM | POA: Diagnosis not present

## 2021-03-11 DIAGNOSIS — Z8616 Personal history of COVID-19: Secondary | ICD-10-CM | POA: Diagnosis not present

## 2021-03-11 DIAGNOSIS — D649 Anemia, unspecified: Secondary | ICD-10-CM | POA: Insufficient documentation

## 2021-03-11 DIAGNOSIS — B952 Enterococcus as the cause of diseases classified elsewhere: Secondary | ICD-10-CM | POA: Insufficient documentation

## 2021-03-11 DIAGNOSIS — S41111A Laceration without foreign body of right upper arm, initial encounter: Secondary | ICD-10-CM | POA: Insufficient documentation

## 2021-03-11 DIAGNOSIS — Z8249 Family history of ischemic heart disease and other diseases of the circulatory system: Secondary | ICD-10-CM | POA: Insufficient documentation

## 2021-03-11 DIAGNOSIS — E1122 Type 2 diabetes mellitus with diabetic chronic kidney disease: Secondary | ICD-10-CM | POA: Diagnosis not present

## 2021-03-11 DIAGNOSIS — Z7901 Long term (current) use of anticoagulants: Secondary | ICD-10-CM | POA: Insufficient documentation

## 2021-03-11 DIAGNOSIS — I2510 Atherosclerotic heart disease of native coronary artery without angina pectoris: Secondary | ICD-10-CM | POA: Diagnosis not present

## 2021-03-11 DIAGNOSIS — Z7982 Long term (current) use of aspirin: Secondary | ICD-10-CM | POA: Insufficient documentation

## 2021-03-11 DIAGNOSIS — N184 Chronic kidney disease, stage 4 (severe): Secondary | ICD-10-CM | POA: Diagnosis not present

## 2021-03-11 DIAGNOSIS — G4733 Obstructive sleep apnea (adult) (pediatric): Secondary | ICD-10-CM | POA: Diagnosis not present

## 2021-03-11 DIAGNOSIS — I130 Hypertensive heart and chronic kidney disease with heart failure and stage 1 through stage 4 chronic kidney disease, or unspecified chronic kidney disease: Secondary | ICD-10-CM | POA: Insufficient documentation

## 2021-03-11 DIAGNOSIS — Z955 Presence of coronary angioplasty implant and graft: Secondary | ICD-10-CM | POA: Insufficient documentation

## 2021-03-11 DIAGNOSIS — I471 Supraventricular tachycardia: Secondary | ICD-10-CM | POA: Diagnosis not present

## 2021-03-11 DIAGNOSIS — Z833 Family history of diabetes mellitus: Secondary | ICD-10-CM | POA: Insufficient documentation

## 2021-03-11 DIAGNOSIS — Z951 Presence of aortocoronary bypass graft: Secondary | ICD-10-CM | POA: Insufficient documentation

## 2021-03-11 LAB — CBC
HCT: 27.7 % — ABNORMAL LOW (ref 39.0–52.0)
Hemoglobin: 8.9 g/dL — ABNORMAL LOW (ref 13.0–17.0)
MCH: 32.8 pg (ref 26.0–34.0)
MCHC: 32.1 g/dL (ref 30.0–36.0)
MCV: 102.2 fL — ABNORMAL HIGH (ref 80.0–100.0)
Platelets: 406 10*3/uL — ABNORMAL HIGH (ref 150–400)
RBC: 2.71 MIL/uL — ABNORMAL LOW (ref 4.22–5.81)
RDW: 14.5 % (ref 11.5–15.5)
WBC: 8.6 10*3/uL (ref 4.0–10.5)
nRBC: 0 % (ref 0.0–0.2)

## 2021-03-11 LAB — COMPREHENSIVE METABOLIC PANEL
ALT: 45 U/L — ABNORMAL HIGH (ref 0–44)
AST: 34 U/L (ref 15–41)
Albumin: 2.6 g/dL — ABNORMAL LOW (ref 3.5–5.0)
Alkaline Phosphatase: 124 U/L (ref 38–126)
Anion gap: 10 (ref 5–15)
BUN: 22 mg/dL (ref 8–23)
CO2: 29 mmol/L (ref 22–32)
Calcium: 9.7 mg/dL (ref 8.9–10.3)
Chloride: 94 mmol/L — ABNORMAL LOW (ref 98–111)
Creatinine, Ser: 2.78 mg/dL — ABNORMAL HIGH (ref 0.61–1.24)
GFR, Estimated: 23 mL/min — ABNORMAL LOW (ref >60)
Glucose, Bld: 160 mg/dL — ABNORMAL HIGH (ref 70–99)
Potassium: 4 mmol/L (ref 3.5–5.1)
Sodium: 133 mmol/L — ABNORMAL LOW (ref 135–145)
Total Bilirubin: 0.6 mg/dL (ref 0.3–1.2)
Total Protein: 5.7 g/dL — ABNORMAL LOW (ref 6.5–8.1)

## 2021-03-11 LAB — IRON AND TIBC
Iron: 34 ug/dL — ABNORMAL LOW (ref 45–182)
Saturation Ratios: 19 % (ref 17.9–39.5)
TIBC: 181 ug/dL — ABNORMAL LOW (ref 250–450)
UIBC: 147 ug/dL

## 2021-03-11 LAB — FERRITIN: Ferritin: 485 ng/mL — ABNORMAL HIGH (ref 24–336)

## 2021-03-11 MED ORDER — FUROSEMIDE 40 MG PO TABS: ORAL_TABLET | ORAL | 3 refills | Status: DC

## 2021-03-11 MED ORDER — POTASSIUM CHLORIDE CRYS ER 20 MEQ PO TBCR: EXTENDED_RELEASE_TABLET | ORAL | 3 refills | Status: DC

## 2021-03-11 NOTE — Progress Notes (Signed)
Patient ID: [name], male   DOB: [birth year], 75 y.o.   MRN: [id number] PCP: Dr Harrington Challenger Cardiology: Dr. Aundra Dubin  75 y.o. with history of CAD s/p CABG and obesity s/p lap banding procedure presents for cardiology followup.  Given exertional dyspnea and chest tightness, he had LHC in 1/15.  This showed no change from prior.  Grafts were patent and there was severe stenosis in distal D1, not amenable to PCI.   In 2017, he developed increased exertional dyspnea.  Eventually had a TEE showing severe MR with prolapse of a portion of the anterior mitral valve leaflet.  Coronary angiography in 6/17 showed patent grafts and 90% stenosis of distal diagonal not amenable to intervention.  In 8/17, he had mitral valve repair.  Post-op diastolic CHF, Lasix started and increased.   He had a cath again in 9/18 showing stable coronary anatomy.    He developed atrial arrhythmias in 12/18.  Both fibrillation and flutter were seen.  Saw Dr. Rayann Heman, recommended DCCV with amiodarone or sotalol if fibrillation recurs.  He had DCCV in 1/19.   In 6/19, he was noted to have developed AKI. Creatinine went up to 3.46.  Renal US was done, showing normal kidneys. Cause of AKI was not clear.  We stopped his Lasix. He saw nephrology and eventually went back on Lasix, currently taking 80 mg daily.  He is not using any NSAIDs.    He stopped pravastatin due to myalgias. We struggled to get him on a PCSK9 inhibitor through the [place] but he was finally able to get Praluent.    Underwent Lexiscan Cardiolite in 11/19, which showed old inferior MI with no ischemia.  Echo in 11/19 showed EF 55-60%, s/p MV repair with mean gradient 6 mmHg. Echo 12/20 showed EF 50-55%, mildly decreased RV systolic function, repaired mitral valve with mild MR, mean gradient 6 mmHg across MV.    He had DCCV from atrial fibrillation to NSR in 1/21 and again in 2/21.   He reported significant fatigue and dyspnea in setting of weight loss with low BP and HR.   Coreg was discontinued. He felt better with that change.    In 9/21, he had COVID-19 infection but was not hospitalized.   Zio patch in 3/22 showed frequent short atrial tachycardia episodes.  He saw Dr. Rayann Heman, medical therapy recommended (on amiodarone).   He was admitted in 5/22 with AKI from obstruction of right ureter by stone, he was treated by urology.   He was admitted in 6/22 with right-sided weakness and was found to have left cerebral white matter CVA.  He had been compliant with Eliquis, ASA 81 was added.   He was readmitted 7/22 for generalized weakness, falls and intermittent fevers. 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 No evidence of osteomyelitis/discitis on MRI lumbar spine -TEE 03/04/2021 noted oscillating density on a repaired mitral valve likely related to suture but small vegetation cannot be ruled out - ID prescribed 6 weeks of ampicillin plus ceftriaxone and outpatient ID follow-up - IR consulted for PICC for long-term antibiotic therapy, which was placed 7/14 - HH abx arranged and to be followed by ID Service. Also of note, TEE showed evidence of atrial level shunting by color flow doppler w/ large PFO.   He presents to clinic today for f/u. Here w/ wife. Notes fatigued and decreased energy levels. No significant dyspnea beyond usual baseline. Denies fever and chills. He is fluid overloaded w/ 1-2+ bilateral LEE on exam  2/3 up legs. Wt also up 11 lb from baseline. Suspect 2/2 IVFs given recent hospitalization. He reports full compliance w/ Lasix and good UOP on 40 mg daily.   He continues w/ residual Rt sided weakness. Getting outpatient PT.   Also of note, he bumped his arm the other day against a wall resulting in a skin tear. This has been managed at home w/ bandaging but he has had persistent bleeding. Current bandage gauze is saturated.    TEE 7/22  1. S/P MV repair; oscillating density associated with MV annulus likely suture  from previous repair; cannot exclude small vegetation; likely needs 6 weeks antibiotics to cover SBE; no MR; elevated mean gradient (12 mmHg). 2. Left ventricular ejection fraction, by estimation, is 60 to 65%. The left ventricle has normal function. The left ventricle has no regional wall motion abnormalities. 3. Right ventricular systolic function is normal. The right ventricular size is mildly enlarged. 4. Left atrial size was moderately dilated. No left atrial/left atrial appendage thrombus was detected. 5. The mitral valve has been repaired/replaced. No evidence of mitral valve regurgitation. There is a present in the mitral position. Procedure Date: 03/25/2016. 6. The aortic valve is tricuspid. Aortic valve regurgitation is not visualized. 7. There is mild (Grade II) plaque involving the descending aorta. 8. Evidence of atrial level shunting detected by color flow Doppler. There is a large patent foramen ovale.    ECG: not performed   Labs (11/12): LDL 70 Labs (5/13): K 4.1, creatinine 1.1, BNP 48 Labs (12/13): K 3.4, creatinine 1.07, LDL 59, HDL 37 Labs (1/15): K 4.3, creatinine 1.2, HCT 40.3 Labs (3/15): LDL 38, HDL 81, BNP 55, K 4, creatinine 1.2 Labs (6/15): K 4.2, creatinine 1.4, LFTs normal, LDL 66, HDL 35 Labs (8/16): K 4, creatinine 1.6, BNP 64 Labs (12/16): LDL 61, HDL 37 Labs (4/17): K 4.2, creatinine 1.35, HCT 38.7, BNP 80 Labs (8/17): K 4.2, creatinine 1.4 => 1.66, BNP 159 Labs (12/17): K 3.6, creatinine 1.75 Labs (2/18): LDL 76, HDL 36 Labs (9/18): K 3.9, creatinine 1.76 Labs (1/19): K 4.9, creatinine 1.76, hgb 14 Labs (6/19): K 3.6, creatinine 3.46 Labs (7/19): K 4.1, creatinine 3.66, hgb 12.7 Labs (10/19): LDL 177, hgb 13.4, troponin negative, K 4, creatinine 4.15 Labs (12/19): K 4.3, creatinine 3.88 Labs (4/20): K 3.6, creatinine 3.39 Labs (11/20): LDL 73, HDL 38 Labs (2/21): K 3.4, creatinine 3.8 Labs (7/21): K 3.7, creatinine 3.66 Labs (6/22): K 4,  creatinine 2.88, LDL 50 Labs (7/22): K 4.0, creatinine 2.78   PMH: 1. OSA: on CPAP.  2. Diabetes mellitus 3. Allergic rhinitis 4. CAD: s/p CABG 1995.  LHC (10/11) with 90% distal D1 (patent proximal D1 stent), total occlusion CFX, total occlusion RCA, no significant stenoses in LAD, SVG-PDA patent, SVG-OM patent, EF 55%.  Medical management.  LHC (5/13) with patent grafts and 90% distal D1 stenosis (no significant change from 10/11).  LHC (1/15) with patent grafts, 75% ISR in D1 stent, 90% stenosis distal D1 (small caliber). D1 not amenable to intervention.  - Coronary angiography (6/17): grafts patent, 90% stenosis distally in large diagonal not amenable to intervention.   - LHC (9/18): SVG-OM and SVG-PDA patent,  75% in-stent restenosis in proximal diagonal then 90% stenosis distally (small in caliber at that point), totally occluded LCx and RCA.  Left coronary system similar to prior caths, D1 may be source of angina.  - Lexiscan Cardiolite (11/19): EF 47%, old inferior MI, no ischemia.  5. Obesity: lap-banding  in 5/11.  6. Chronic diastolic CHF: Echo (9/38) with EF 55-60%, moderate LVH, moderate diastolic dysfunction, s/p mitral valve repair with elevated mean gradient but normal pressure half-time.  - Echo (8/18): EF 55-60%, s/p MV repair with mean gradient 6 mmHg, PASP 28 mmHg, mildly decreased RV systolic function.  - Echo (11/19): EF 55-60%, s/p MV repair with mean gradient 6, PASP 42 mmHg, mildly dilated RV, dilated IVC.  - Echo (12/20): EF 50-55%, mildly decreased RV systolic function. S/p MV repair with mean gradient 6 mmHg, mild MR.  - Echo (2/22): EF 45-50%, mild LVH, moderate RV enlargement with low normal systolic function, s/p mitral valve repair with mean gradient 5 mmHg and trivial MR, mild-moderate AS.  7. TIA: Carotid dopplers in 11/11 with 0-39% bilateral stenosis.  Carotid dopplers 6/17 with 1-39% RICA stenosis.  8. OA: Knees, C-spine. TKR 9/13.  9. HTN 10. Hyperlipidemia:  Myalgias with atorvastatin and Crestor. Now on Praluent.  11. Diverticulosis 12. MItral regurgitation: TEE (6/17) with EF 55-60%, severe eccentric MR, prolapse of a portion of the anterior leaflet, peak RV-RA gradient 45 mmHg.  - Mitral valve repair 8/17 13. CKD: Stage III.  14. Atrial fibrillation/atrial flutter: Both arrhythmias noted in 12/18.  DCCV 12/19.   - DCCV to NSR 1/21.  - DCCV to NSR 2/21.  15. AKI 6/19 => CKD stage 4: uncertain etiology.  Renal US was normal.  16. Gout 17. COVID-19 infection 9/21 18. 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  - Carotid dopplers (6/22) with mild disease only.  19. Nephrolithiasis.  20. Atrial tachycardia: Noted on 3/22 Zio patch.  21. Aortic stenosis: mild-moderate on 2/22 echo.   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  Lives in [place].   Family History  Problem Relation Age of Onset   Other Mother        joint problems   Alzheimer's disease Mother    Hypertension Father    Heart disease Father    CVA Father        age 5   Depression Father    Heart disease Sister        CABG   Stroke Sister    Heart failure Sister        congestive   Diabetes Sister    Heart disease Brother    Hypertension Brother    Congestive Heart Failure Brother     ROS: All systems reviewed and negative except as per HPI.    Current Outpatient Medications  Medication Sig Dispense Refill   acetaminophen (TYLENOL) 325 MG tablet Take 2 tablets (650 mg total) by mouth every 6 (six) hours as needed for mild pain (or Fever >/= 101).     Alirocumab (PRALUENT) 75 MG/ML SOAJ Inject 75 mg into the skin every 14 (fourteen) days.     allopurinol (ZYLOPRIM) 100 MG tablet Take 0.5 tablets (50 mg total) by mouth 2 (two) times a week. Please discuss dosing with your PCP based on your renal function. 4 tablet 0   amiodarone (PACERONE) 200 MG tablet Take 1 tablet (200 mg total) by mouth daily. 60 tablet 0    ampicillin IVPB Inject 2 g into the vein every 8 (eight) hours. Indication:  Enterococcal endocarditis  First Dose: Yes Last Day of Therapy:  04/11/21  Labs - Once weekly:  CBC/D and BMP, Labs - Every other week:  ESR and CRP Method of administration: Ambulatory Pump (Continuous Infusion) Method of administration may  be changed at the discretion of home infusion pharmacist based upon assessment of the patient and/or caregiver's ability to self-administer the medication ordered. 114 Units 0   apixaban (ELIQUIS) 5 MG TABS tablet Take 1 tablet (5 mg total) by mouth 2 (two) times daily. 180 tablet 3   Ascorbic Acid (VITAMIN C WITH ROSE HIPS) 1000 MG tablet Take 1,000 mg by mouth daily.     aspirin EC 81 MG EC tablet Take 1 tablet (81 mg total) by mouth daily. Swallow whole. 30 tablet 11   calcitRIOL (ROCALTROL) 0.25 MCG capsule Take 0.25 mcg by mouth every other day.     cefTRIAXone (ROCEPHIN) IVPB Inject 2 g into the vein every 12 (twelve) hours. Indication:  Enterococcal endocarditis First Dose: Yes Last Day of Therapy:  04/11/21 Labs - Once weekly:  CBC/D and BMP, Labs - Every other week:  ESR and CRP Method of administration: IV Push Method of administration may be changed at the discretion of home infusion pharmacist based upon assessment of the patient and/or caregiver's ability to self-administer the medication ordered. 76 Units 0   cetirizine (ZYRTEC) 10 MG tablet Take 10 mg by mouth daily.     colchicine 0.6 MG tablet Take 0.6-1.2 mg by mouth 2 (two) times daily as needed (gout).      fluticasone (FLONASE) 50 MCG/ACT nasal spray Place 1 spray into both nostrils daily as needed for allergies or rhinitis.     HYDROcodone-acetaminophen (NORCO/VICODIN) 5-325 MG tablet Take 1-2 tablets by mouth every 4 (four) hours as needed for severe pain. 30 tablet 0   nitroGLYCERIN (NITROSTAT) 0.4 MG SL tablet 1 TAB UNDER THE TONGUE EVERY 5 MINUTES AS NEEDED FOR CHEST PAIN UP TO 3 DOSES, IF PERSIST CALL 911  75 tablet 1   pantoprazole (PROTONIX) 40 MG tablet Take 40 mg by mouth daily as needed (heartburn).     Semaglutide,0.25 or 0.5MG/DOS, (OZEMPIC, 0.25 OR 0.5 MG/DOSE,) 2 MG/1.5ML SOPN Inject 0.375 mLs (0.5 mg total) into the skin every Thursday. 3 pen 3   tadalafil (CIALIS) 20 MG tablet TAKE 1 TABLET BY MOUTH ONCE DAILY AS NEEDED 10 tablet 0   traZODone (DESYREL) 100 MG tablet Take 100 mg by mouth at bedtime.     No current facility-administered medications for this encounter.    BP 114/70   Pulse 70   Wt 82.7 kg   BMI 26.17 kg/m    Wt Readings from Last 3 Encounters:  03/11/21 82.7 kg  03/07/21 86.7 kg  02/13/21 77.6 kg   PHYSICAL EXAM: General:  Well appearing elderly WM. No respiratory difficulty HEENT: normal Neck: supple. JVD 8 cm. Carotids 2+ bilat; no bruits. No lymphadenopathy or thyromegaly appreciated. Cor: PMI nondisplaced. Regular rate & rhythm. 2/6 SEM RUSB + tunneled PICC LU chest  Lungs: clear Abdomen: soft, nontender, nondistended. No hepatosplenomegaly. No bruits or masses. Good bowel sounds. Extremities: no cyanosis, clubbing, rash, 2+ bilateral LEE 2/3 up shins  Neuro: alert & oriented x 3, cranial nerves grossly intact. moves all 4 extremities w/o difficulty. Affect pleasant.   Assessment/Plan: 1. S/p mitral valve repair:  Echo 2/22 showed stable mitral valve repair compared to prior echo with trivial MR, mean gradient 5 mmHg.  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 No evidence of osteomyelitis/discitis on MRI lumbar spine -TEE 03/04/2021 noted oscillating density on a repaired mitral valve likely related to suture but small vegetation cannot be ruled out. No MR. - ID prescribed 6 weeks of ampicillin  plus ceftriaxone - c/w abx x 6 weeks per ID  - With prosthetic material, should use antibiotic prophylaxis with dental work.  2. CAD:  He has severe stenosis in a distal D1 that is not amenable to intervention.  This has  been stable on last couple of caths. He has occluded RCA and LCx known from the past with SVGs to OM and PDA.  Echo in 2/22 with EF 45-50%. Cardiolite in 11/19 showed no ischemia. No chest pain.  - ASA 81 daily.  - He cannot tolerate statins with myalgias, Now on Praluent.    3. Hyperlipidemia: Myalgias with Crestor, pravastatin, and atorvastatin.  We were finally able to get him Praluent through the [place]. LDL 50 in 6/22.  4. Chronic primarily diastolic CHF:  NYHA class II symptoms, he is not volume overloaded on exam.  Echo (2/22) with EF 45-50%, mild LVH, moderate RV enlargement with low normal systolic function, s/p mitral valve repair with mean gradient 5 mmHg and trivial MR, mild-moderate AS. TEE 7/22 EF 60-65%.  - Chronic NYHA Class II-III. He is fluid overloaded w/ 1-2+ bilateral LEE on exam 2/3 up legs. Wt also up 11 lb from baseline. Suspect 2/2 IVFs given recent hospitalization. - increase lasix to 40 mg bid x 2 days, then return to 40 mg daily  - add TED hoses  5. OSA: Continue CPAP.  6. Atrial fibrillation: Paroxysmal, in NSR after DCCV in 2/21.  He is now on amiodarone to maintain NSR given rapid recurrence of AF after DCCV in 1/21.  Probably not a good ablation candidate with size of atria.   - Continue apixaban 5 mg bid.  - Continue amiodarone 200 mg daily. Check LFTs, TSH at next appt.  He will need a regular eye exam.  7. CKD stage 4: AKI without recovery, uncertain etiology.  He sees nephrology, there is concern that he is progressing towards HD. He now has AV fistula. Recent creatinine 2.78 - check BMP today   8. Diabetes: He is now followed by endocrinology. 9. Atrial tachycardia: Paroxysmal.  Seen by Dr. Rayann Heman, medical management planned.  Continue amiodarone.  10. CVA: Atrial fibrillation-related.  Carotid dopplers 6/22 with mild stenosis only.  - He has been on Eliquis, ASA 81 added by neurology.  - Recent TEE 7/22 showed evidence of atrial level shunting detected by color  flow Doppler. There is a large patent foramen ovale. D/w Dr. Aundra Dubin. We will d/w neurology to see if they feel this may have contributed to recent CVA and see if they recommend closure device.  11. Aortic stenosis: Mild-moderate on 2/22 echo.  12. Enterococcus faecalis bacteremia ?Prosthetic valve endocarditis: Recent admit 7/22. No evidence of osteomyelitis/discitis on MRI lumbar spine. TEE 03/04/2021 noted oscillating density on a repaired mitral valve likely related to suture but small vegetation cannot be ruled out. No MR. ID prescribed 6 weeks of ampicillin plus ceftriaxone - c/w abx per ID 13. PFO: noted on recent TEE 7/22. Had recent CVA 6/22. D/w Dr. Aundra Dubin. We will d/w neurology to see if they feel this may have contributed to recent CVA and see if they recommend closure device. If treatment needed, will refer to Dr. Copper for closure device, though this will need to be delayed until he has finished 6 weeks abx and f/u blood cultures are clear.  14. Skin Tear RUE: dressing + sliver nitrate applied by nursing staff. He was advised to f/u w/ PCP if bleeding persists 15. Chronic Anemia: complains of fatigue.  Most recent hgb was 8.2 - repeat CBC + Iron studies, may need ferraheme    F/u in 3-4 weeks    Lyda Jester, PA-C  03/11/2021

## 2021-03-11 NOTE — Telephone Encounter (Signed)
Error

## 2021-03-11 NOTE — Progress Notes (Signed)
Pt with multiple skin tears to L forearm from hitting it on his dresser a couple of days ago. Pt and wife states it continues to bleed despite pressure and bandages. Blood soak bandage was removed and silver nitrate applied by Darrick Grinder, NP, arm bandage and instructions given to leave bandage alone. Pt thankful for assistance.

## 2021-03-11 NOTE — Patient Instructions (Signed)
Increase Furosemide to 40 mg Twice daily FOR 2 DAYS ONLY, then back to 40 mg Daily  Increase Potassium to 40 meq (2 tabs) daily FOR 2 DAYS, then back to 20 meq Daily  Labs done today, your results will be available in MyChart, we will contact you for abnormal readings.  Your physician recommends that you schedule a follow-up appointment in: 1 month  Discuss with Neurology about stopping Aspirin  If you have any questions or concerns before your next appointment please send [place] a message through [place] or call our office at [phone].    TO LEAVE A MESSAGE FOR THE NURSE SELECT OPTION 2, PLEASE LEAVE A MESSAGE INCLUDING: YOUR NAME DATE OF BIRTH CALL BACK NUMBER REASON FOR CALL**this is important as we prioritize the call backs  YOU WILL RECEIVE A CALL BACK THE SAME DAY AS LONG AS YOU CALL BEFORE 4:00 PM  milAt the [facility], you and your health needs are our priority. As part of our continuing mission to provide you with exceptional heart care, we have created designated Provider Care Teams. These Care Teams include your primary Cardiologist (physician) and Advanced Practice Providers (APPs- Physician Assistants and Nurse Practitioners) who all work together to provide you with the care you need, when you need it.   You may see any of the following providers on your designated Care Team at your next follow up: Dr Glori Bickers Dr [name] Dr Patrice Paradise, NP Lyda Jester, [place] Ginnie Smart Audry Riles, PharmD   Please be sure to bring in all your medications bottles to every appointment.

## 2021-03-14 ENCOUNTER — Other Ambulatory Visit: Payer: Self-pay | Admitting: *Deleted

## 2021-03-14 NOTE — Patient Outreach (Signed)
[place] [facility]) Care Management  03/14/2021  [name] [birth year] [id number]   Outreach attempt #2, successful to wife however state she is out grocery shopping, unable to talk.  Will call this care manager back.  Will await call back, if no call back will follow up within the next 3-4 business days.  Valente David, [place], MSN [place] [phone]

## 2021-03-18 ENCOUNTER — Other Ambulatory Visit: Payer: Self-pay

## 2021-03-18 ENCOUNTER — Other Ambulatory Visit (HOSPITAL_COMMUNITY): Payer: Self-pay | Admitting: Family Medicine

## 2021-03-18 ENCOUNTER — Ambulatory Visit (HOSPITAL_COMMUNITY)
Admission: RE | Admit: 2021-03-18 | Discharge: 2021-03-18 | Disposition: A | Discharge status: Home / Self Care | Payer: Medicare Other | Source: Ambulatory Visit | Attending: Family Medicine | Admitting: Family Medicine

## 2021-03-18 DIAGNOSIS — R630 Anorexia: Secondary | ICD-10-CM | POA: Diagnosis not present

## 2021-03-18 DIAGNOSIS — R634 Abnormal weight loss: Secondary | ICD-10-CM | POA: Diagnosis not present

## 2021-03-18 DIAGNOSIS — M79605 Pain in left leg: Secondary | ICD-10-CM | POA: Diagnosis not present

## 2021-03-18 DIAGNOSIS — Z09 Encounter for follow-up examination after completed treatment for conditions other than malignant neoplasm: Secondary | ICD-10-CM | POA: Diagnosis not present

## 2021-03-18 DIAGNOSIS — R52 Pain, unspecified: Secondary | ICD-10-CM | POA: Insufficient documentation

## 2021-03-18 DIAGNOSIS — I69359 Hemiplegia and hemiparesis following cerebral infarction affecting unspecified side: Secondary | ICD-10-CM | POA: Diagnosis not present

## 2021-03-18 DIAGNOSIS — I679 Cerebrovascular disease, unspecified: Secondary | ICD-10-CM | POA: Diagnosis not present

## 2021-03-18 DIAGNOSIS — B952 Enterococcus as the cause of diseases classified elsewhere: Secondary | ICD-10-CM | POA: Diagnosis not present

## 2021-03-18 DIAGNOSIS — M7989 Other specified soft tissue disorders: Secondary | ICD-10-CM | POA: Diagnosis not present

## 2021-03-18 DIAGNOSIS — R531 Weakness: Secondary | ICD-10-CM | POA: Diagnosis not present

## 2021-03-18 DIAGNOSIS — R296 Repeated falls: Secondary | ICD-10-CM | POA: Diagnosis not present

## 2021-03-18 NOTE — Progress Notes (Signed)
Lower extremity venous LT study completed.  Preliminary results relayed to Alyse Low, RN for Harrington Challenger, MD. Patient and spouse informed that providers will contact them on 7/27 to discuss results.   See CV Proc for preliminary results report.   Darlin Coco, RDMS, RVT

## 2021-03-19 DIAGNOSIS — Z791 Long term (current) use of non-steroidal anti-inflammatories (NSAID): Secondary | ICD-10-CM | POA: Diagnosis not present

## 2021-03-20 ENCOUNTER — Other Ambulatory Visit: Payer: Self-pay | Admitting: *Deleted

## 2021-03-20 NOTE — Patient Outreach (Signed)
[place] [facility]) Care Management  03/20/2021  [name] 01/05/1946 [id number]   Outreach attempt #3, successful to member however he request this care manager speak with wife, who is currently not available.  Advised wife call this care manager once she is home, will await call back.  If no call back will follow up within the next 3-4 business days.  Valente David, [place], MSN [place] [phone]

## 2021-03-25 ENCOUNTER — Other Ambulatory Visit: Payer: Self-pay

## 2021-03-25 ENCOUNTER — Ambulatory Visit (INDEPENDENT_AMBULATORY_CARE_PROVIDER_SITE_OTHER): Payer: Medicare Other | Admitting: Internal Medicine

## 2021-03-25 ENCOUNTER — Encounter: Payer: Self-pay | Admitting: Internal Medicine

## 2021-03-25 VITALS — BP 134/65 | HR 60 | Temp 98.4°F | Ht 70.0 in | Wt 173.0 lb

## 2021-03-25 DIAGNOSIS — I330 Acute and subacute infective endocarditis: Secondary | ICD-10-CM | POA: Diagnosis not present

## 2021-03-25 NOTE — Progress Notes (Signed)
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  Doing well on betalactam 7/06 bcx positive 7/08 bcx negative  6 weeks planned abx 7/08-8/19  PLAN: Ok to remove central line on 8/19; no need for further antibiotics iv or oral at that time Tte ordered to be done around 8/19 F/u with dr Baxter Flattery sometimes after the tte is done Continue amp/ceftriaxone   I have spent a total of 30 minutes of face-to-face and non-face-to-face time, excluding clinical staff time, preparing to see patient, ordering tests and/or medications, and provide counseling the patient  Allergies  Allergen Reactions   Crestor [Rosuvastatin] Other (See Comments)    Muscle aches    Lipitor [Atorvastatin] Other (See Comments)    Muscle aches   Pravastatin Other (See Comments)    Muscle aches   Carvedilol     Other reaction(s): loss of appetite   Metformin Diarrhea   Serotonin Reuptake Inhibitors (Ssris)     Other reaction(s): Muscle aches   Zocor [Simvastatin]     Other reaction(s): Muscle pain   Codeine Itching and Other (See Comments)    Extremity tingling-- can take synthetic    Scheduled Meds: Continuous Infusions: PRN Meds:.   SUBJECTIVE: Doing well Right chest line no issue No f/c No n/v/diarrhea No rash No joint pain/back pain  Patient is on amp/ceftriaxone 6 weeks from 7/08-  Review of Systems: ROS All other ROS was negative, except mentioned above     OBJECTIVE: Vitals:   03/25/21 1411  BP: 134/65  Pulse: 60  Temp: 98.4 F (36.9 C)  TempSrc: Oral  SpO2: 100%  Weight: 173 lb (78.5 kg)  Height: 5\' 10"  (1.778 m)   Body mass index is 24.82 kg/m.  Physical Exam General/constitutional: no distress, pleasant HEENT: Normocephalic, PER, Conj Clear, EOMI,  Oropharynx clear Neck supple CV: rrr no mrg Lungs: clear to auscultation, normal respiratory effort Abd: Soft, Nontender Ext: no edema Skin: No Rash -- multiple small echymoses on upper extremities Neuro: nonfocal MSK: no peripheral joint swelling/tenderness/warmth; back spines nontender   Central line presence: right chest catheter site no purulence/erythema/tenderness   Lab Results Lab Results  Component Value Date   WBC 8.6 03/11/2021   HGB 8.9 (L) 03/11/2021   HCT 27.7 (L) 03/11/2021   MCV 102.2 (H) 03/11/2021   PLT 406 (H) 03/11/2021    Lab Results  Component Value Date   CREATININE 2.78 (H) 03/11/2021   BUN 22 03/11/2021   NA 133 (L) 03/11/2021   K 4.0 03/11/2021   CL 94 (L) 03/11/2021   CO2 29 03/11/2021    Lab Results  Component Value Date   ALT 45 (H) 03/11/2021   AST 34 03/11/2021   ALKPHOS 124 03/11/2021   BILITOT 0.6 03/11/2021    [facility]: 7/28 cbc 7/9/300s; cr 2.8   Crp: 7/28      48 (<10) 7/19      75  Microbiology: No results found for this or any previous visit (from the past 240 hour(s)).   Serology:   Imaging: If present, new imagings (plain films, ct scans, and mri) have been personally visualized and interpreted; radiology reports have been reviewed. Decision making incorporated into the Impression / Recommendations.  7/12 tee  1. S/P MV repair; oscillating density associated with  MV annulus likely  suture from previous repair; cannot exclude small vegetation; likely needs  6 weeks antibiotics to cover SBE; no MR; elevated mean gradient (12 mmHg).   2. Left ventricular ejection fraction, by estimation, is 60 to 65%. The  left ventricle has normal function. The left ventricle has no regional  wall motion abnormalities.   3. Right ventricular systolic function is normal. The right ventricular  size is mildly enlarged.   4. Left atrial size was moderately dilated. No left atrial/left atrial  appendage thrombus was detected.   5. The  mitral valve has been repaired/replaced. No evidence of mitral  valve regurgitation. There is a present in the mitral position. Procedure  Date: 03/25/2016.   6. The aortic valve is tricuspid. Aortic valve regurgitation is not  visualized.   7. There is mild (Grade II) plaque involving the descending aorta.   8. Evidence of atrial level shunting detected by color flow Doppler.  There is a large patent foramen ovale.    Jabier Mutton, [name] for Infectious Disease [place] [phone] pager    03/25/2021, 2:35 PM

## 2021-03-25 NOTE — Patient Instructions (Signed)
You should finish iv abx on 8/19  I have ordered an echocardiogram; please do that around 8/19   Your home health will direct you on how the central line is to be removed on 8/19   Follow up with dr Baxter Flattery 1-2 weeks after the echocardiogram is done   Thank you

## 2021-03-25 NOTE — Addendum Note (Signed)
Addended byPrudencio Pair T on: 03/25/2021 02:59 PM   Modules accepted: Orders

## 2021-03-26 ENCOUNTER — Telehealth: Payer: Self-pay | Admitting: *Deleted

## 2021-03-26 DIAGNOSIS — Z792 Long term (current) use of antibiotics: Secondary | ICD-10-CM | POA: Diagnosis not present

## 2021-03-26 NOTE — Telephone Encounter (Signed)
Relayed verbal orders per Dr. Gale Journey to Lenna Sciara at [place] that IV antibiotics will end 04/11/21. Patient has tunneled catheter, will need IR to remove line after 04/11/21. Patient aware this must be removed in the hospital. Dr. Gale Journey placed order for cath removal to IR. Landis Gandy, RN

## 2021-03-27 ENCOUNTER — Other Ambulatory Visit: Payer: Self-pay | Admitting: *Deleted

## 2021-03-27 NOTE — Patient Outreach (Addendum)
[facility]) Care Management  03/27/2021  [name] 09-29-1945 [id number]   Outreach attempt #4, unsuccessful, HIPAA compliant voice message left.  Will send outreach letter and follow up within the next 3-4 business days.    Valente David, [place], MSN [place] [phone]

## 2021-03-28 ENCOUNTER — Encounter: Payer: No Typology Code available for payment source | Admitting: Vascular Surgery

## 2021-03-28 ENCOUNTER — Encounter: Payer: Self-pay | Admitting: *Deleted

## 2021-03-28 ENCOUNTER — Encounter: Payer: Self-pay | Admitting: Vascular Surgery

## 2021-03-28 ENCOUNTER — Other Ambulatory Visit: Payer: Self-pay

## 2021-03-28 ENCOUNTER — Ambulatory Visit (INDEPENDENT_AMBULATORY_CARE_PROVIDER_SITE_OTHER): Payer: Medicare Other | Admitting: Vascular Surgery

## 2021-03-28 VITALS — BP 137/87 | HR 54 | Temp 98.3°F | Resp 20 | Ht 70.0 in | Wt 172.0 lb

## 2021-03-28 DIAGNOSIS — M7122 Synovial cyst of popliteal space [Baker], left knee: Secondary | ICD-10-CM

## 2021-03-28 NOTE — Patient Outreach (Signed)
[facility]) Care Management  [place]  03/28/2021   [name] 1946/02/18 [id number]    Social: Incoming call received back from wife, state member is improving.  They live with several family members, always someone to help if needed.  Member is able to to perform ADL's independently, has [facility] nurse coming for PICC line care.  Wife is administering IV antibiotics, last dose will be 8/19, will have line removed 8/22.  Conditions: Per chart, has history of HTN, CAD, CHF, A-fib, OSA, DM (A1C - 6.2), OA, skin cancer, CKD, Covid, and HLD.  Medications:  Reviewed with member and wife, report taking as instructed.  Denies need for financial assistance as he gets meds from [place].  Consent: Member and wife agrees to [facility] involvement.  Encounter Medications:  Outpatient Encounter Medications as of 03/27/2021  Medication Sig   acetaminophen (TYLENOL) 325 MG tablet Take 2 tablets (650 mg total) by mouth every 6 (six) hours as needed for mild pain (or Fever >/= 101).   Alirocumab (PRALUENT) 75 MG/ML SOAJ Inject 75 mg into the skin every 14 (fourteen) days.   allopurinol (ZYLOPRIM) 100 MG tablet Take 0.5 tablets (50 mg total) by mouth 2 (two) times a week. Please discuss dosing with your PCP based on your renal function.   amiodarone (PACERONE) 200 MG tablet Take 1 tablet (200 mg total) by mouth daily.   ampicillin IVPB Inject 2 g into the vein every 8 (eight) hours. Indication:  Enterococcal endocarditis  First Dose: Yes Last Day of Therapy:  04/11/21  Labs - Once weekly:  CBC/D and BMP, Labs - Every other week:  ESR and CRP Method of administration: Ambulatory Pump (Continuous Infusion) Method of administration may be changed at the discretion of home infusion pharmacist based upon assessment of the patient and/or caregiver's ability to self-administer the medication ordered.   apixaban (ELIQUIS) 5 MG TABS tablet Take 1 tablet (5 mg total) by mouth 2 (two) times daily.    Ascorbic Acid (VITAMIN C WITH ROSE HIPS) 1000 MG tablet Take 1,000 mg by mouth daily.   aspirin EC 81 MG EC tablet Take 1 tablet (81 mg total) by mouth daily. Swallow whole.   calcitRIOL (ROCALTROL) 0.25 MCG capsule Take 0.25 mcg by mouth every other day.   Carboxymethylcellulose Sodium 1 % GEL APPLY 1 DROP TO EACH EYE AT BEDTIME   cefTRIAXone (ROCEPHIN) IVPB Inject 2 g into the vein every 12 (twelve) hours. Indication:  Enterococcal endocarditis First Dose: Yes Last Day of Therapy:  04/11/21 Labs - Once weekly:  CBC/D and BMP, Labs - Every other week:  ESR and CRP Method of administration: IV Push Method of administration may be changed at the discretion of home infusion pharmacist based upon assessment of the patient and/or caregiver's ability to self-administer the medication ordered.   cetirizine (ZYRTEC) 10 MG tablet Take 10 mg by mouth daily.   colchicine 0.6 MG tablet Take 0.6-1.2 mg by mouth 2 (two) times daily as needed (gout).    fluticasone (FLONASE) 50 MCG/ACT nasal spray Place 1 spray into both nostrils daily as needed for allergies or rhinitis.   furosemide (LASIX) 40 MG tablet Take 1 tablet (40 mg total) by mouth 2 (two) times daily for 2 days, THEN 1 tablet (40 mg total) daily.   HYDROcodone-acetaminophen (NORCO/VICODIN) 5-325 MG tablet Take 1-2 tablets by mouth every 4 (four) hours as needed for severe pain.   nitroGLYCERIN (NITROSTAT) 0.4 MG SL tablet 1 TAB UNDER THE  TONGUE EVERY 5 MINUTES AS NEEDED FOR CHEST PAIN UP TO 3 DOSES, IF PERSIST CALL 911   ONETOUCH VERIO test strip 1 each daily.   pantoprazole (PROTONIX) 40 MG tablet Take 40 mg by mouth daily as needed (heartburn).   potassium chloride SA (KLOR-CON) 20 MEQ tablet Take 2 tablets (40 mEq total) by mouth daily for 2 days, THEN 1 tablet (20 mEq total) daily for 2 days.   Semaglutide,0.25 or 0.5MG/DOS, (OZEMPIC, 0.25 OR 0.5 MG/DOSE,) 2 MG/1.5ML SOPN Inject 0.375 mLs (0.5 mg total) into the skin every Thursday.   tadalafil  (CIALIS) 20 MG tablet TAKE 1 TABLET BY MOUTH ONCE DAILY AS NEEDED   traZODone (DESYREL) 100 MG tablet Take 100 mg by mouth at bedtime.   No facility-administered encounter medications on file as of 03/27/2021.    Functional Status:  In your present state of health, do you have any difficulty performing the following activities: 02/26/2021 02/26/2021  Hearing? - N  Vision? - N  Difficulty concentrating or making decisions? - Y  Walking or climbing stairs? - Y  Comment - secondary to weakness  Dressing or bathing? - N  Doing errands, shopping? N -  Some recent data might be hidden    Fall/Depression Screening: Fall Risk  03/27/2021 03/25/2021 11/06/2020  Falls in the past year? 1 1 1   Number falls in past yr: 1 1 1   Injury with Fall? 1 1 1   Comment - - went to hospital  Risk for fall due to : History of fall(s) History of fall(s) -  Follow up Falls evaluation completed Falls evaluation completed -   PHQ 2/9 Scores 03/27/2021 03/25/2021 03/10/2018 11/22/2017 08/21/2016 05/11/2016 02/14/2014  PHQ - 2 Score 0 0 0 0 0 0 0    Assessment:   Care Plan Care Plan : General Plan of Care (Adult)  Updates made by Valente David, RN since 03/28/2021 12:00 AM     Problem: Risk for readmission related to recent stroke and prosthetic valve endocarditis      Long-Range Goal: Member will not be readmitted to hospital within the  next 60 days   Start Date: 03/27/2021  Expected End Date: 06/26/2021  Priority: High  Note:   Current Barriers:  Knowledge Deficits related to plan of care for management of Atrial Fibrillation and Stroke, Prosthetic Valve Endocarditis  Chronic Disease Management support and education needs related to Atrial Fibrillation and Stroke, prosthetic valve endocarditis  RNCM Clinical Goal(s):  Patient will verbalize understanding of plan for management of Atrial Fibrillation and Stroke and prosthetic valve endocarditis work with [facility] to address needs related to Atrial Fibrillation and stroke  and prosthetic valve endocarditis and Cognitive Deficits and Memory Deficits attend all scheduled medical appointments: Vascular 8/5, Neurology 8/9, cardiology 8/16, ID 8/31, and endocrinology 10/27 not experience hospital admission. Hospital Admissions in last 6 months = 4 work with [place] to gain strength, memory and speech  through collaboration with Consulting civil engineer, provider, and care team.   Interventions: 1:1 collaboration with primary care provider regarding development and update of comprehensive plan of care as evidenced by provider attestation and co-signature Inter-disciplinary care team collaboration (see longitudinal plan of care) Evaluation of current treatment plan related to  self management and patient's adherence to plan as established by provider;   A-fib:  (Status: New goal.) Counseled on increased risk of stroke due to Afib and benefits of anticoagulation for stroke prevention Counseled on bleeding risk associated with Eliquis and importance of self-monitoring  for signs/symptoms of bleeding Afib action plan reviewed  Stroke  (Status: New goal.) Evaluation of current treatment plan related to  Stroke and endocarditis , Limited education about PICC line and IV antibiotics* self-management and patient's adherence to plan as established by provider. Discussed plans with patient for ongoing care management follow up and provided patient with direct contact information for care management team.  Active with [facility] for PT and ST as well Advised patient to Contact [facility] nurse or this RNCM with questions of problems with PICC line; Provided education to patient re: PICC line care and IV antibiotics, bacteremia, and stroke prevention; Reviewed scheduled/upcoming provider appointments including Neurology and ID;  Patient Goals/Self-Care Activities: Patient will attend all scheduled provider appointments Patient will continue to perform ADL's independently Patient will call  provider office for new concerns or questions - ask family or friend for a ride - call to cancel if needed - keep a calendar with appointment dates - arrange in-home help services - eat healthy to increase strength - know who to call for help if I fall        Goals Addressed             This Visit's Progress    THN - Keep or Improve My Strength-Stroke       Timeframe:  Long-Range Goal Priority:  Medium Start Date:     8/4                        Expected End Date:    11/4                   Follow Up Date 04/18/2021    - arrange in-home help services - eat healthy to increase strength - know who to call for help if I fall    Why is this important?   Before the stroke you probably did not think much about being safe when you are up and about.  Now, it may be harder for you to get around.  It may also be easier for you to trip or fall.  It is common to have muscle weakness after a stroke. You may also feel like you cannot control an arm or leg.  It will be helpful to work with a physical therapist to get your strength and muscle control back.  It is good to stay as active as you can. Walking and stretching help you stay strong and flexible.  The physical therapist will develop an exercise program just for you.     Notes:        THN - Make and Keep All Appointments       Timeframe:  Short-Term Goal Priority:  High Start Date:      8/4                       Expected End Date:        10/4               Barriers: Knowledge  Follow Up Date 04/18/2021    - ask family or friend for a ride - call to cancel if needed - keep a calendar with appointment dates    Why is this important?   Part of staying healthy is seeing the doctor for follow-up care.  If you forget your appointments, there are some things you can do to stay on track.    Notes:  Plan:  Follow-up: Patient agrees to Care Plan and Follow-up. Follow-up in 3 week(s).  Valente David, [place], MSN [place] [phone]

## 2021-03-28 NOTE — Progress Notes (Signed)
Patient ID: [name], male   DOB: 1945-09-20, 75 y.o.   MRN: [id number]  Reason for Consult: New Patient (Initial Visit)   Referred by [name], MD  Subjective:     HPI:  [name] is a 75 y.o. male history of chronic kidney disease has a fistula in his left upper extremity that has never been used.  He does take apixaban for atrial fibrillation.  Recently had a fall and was hospitalized.  He had persistent swelling of his bilateral lower extremities underwent DVT study which demonstrated abnormal mass posterior to his left knee.  He states that he has some discomfort there no frank pain none of this was present prior to his fall.  He does have associated weight loss that had begun prior to his fall.  He has never had any vein intervention of his lower extremities other than vein harvest for saphenous graft for CABG.  He does walk although he is somewhat slowed at this time due to pain.  He uses Voltaren for his knee pain.  Overall he states that he is improving since his fall.  Past Medical History:  Diagnosis Date   Allergic rhinitis    Anxiety    Atrial fibrillation (HCC)    Cancer (HCC)    hx of skin cancers    Chronic kidney disease    Coronary artery disease    a. s/p MI & CABG; b. s/p PCI Diag;  c. Cath 12/2011: LAD diff dzs/small, Diag 75% isr, 90 dist to stent (small), LCX & RCA occluded, VG->OM 40, VG->PDA patent - med rx.   Depression    PTSD   Diabetes mellitus    not on medications   Diastolic CHF, chronic ([place])    a. EF 55-60% by echo 2007   GERD (gastroesophageal reflux disease)    "not anymore" " I had a lap band"   History of diverticulitis of colon    5 YRS AGO   History of pneumonia    2017   History of transient ischemic attack (TIA)    CAROTID DOPPLERS NOV 2011  0-39& BIL. 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   Complex valvuloplasty including artificial Gore-tex neochord placement x4, plication of anterior commissure, and 26 mm Sorin Memo 3D ring annuloplasty via right mini thoracotomy approach   Shortness of breath dyspnea    with exertion   Skin cancer    Sleep apnea    cpap- see ov note in EPIC 05/12/11 for settings    Stroke ([place])    hx of   Stroke [facility])    Family History  Problem Relation Age of Onset   Other Mother        joint problems   Alzheimer's disease Mother    Hypertension Father    Heart disease Father    CVA Father        age 98   Depression Father    Heart disease Sister        CABG   Stroke Sister    Heart failure Sister  congestive   Diabetes Sister    Heart disease Brother    Hypertension Brother    Congestive Heart Failure Brother    Past Surgical History:  Procedure Laterality Date   AV FISTULA PLACEMENT Left 08/02/2018   Procedure: ARTERIOVENOUS (AV) FISTULA CREATION RADIOCEPHALIC;  Surgeon: Angelia Mould, MD;  Location: [facility] OR;  Service: Vascular;  Laterality: Left;   BLEPHAROPLASTY  [place]  2001, 2004, 2009, 2011   CARDIAC CATHETERIZATION N/A 01/23/2016   Procedure: Right/Left Heart Cath and Coronary Angiography;  Surgeon: [name], MD;  Location: [place] CV LAB;  Service: Cardiovascular;  Laterality: N/A;   CARDIOVERSION N/A 09/07/2017   Procedure: CARDIOVERSION;  Surgeon: [name], MD;  Location: [facility] ENDOSCOPY;  Service: Cardiovascular;  Laterality: N/A;   CARDIOVERSION N/A 09/13/2019   Procedure: CARDIOVERSION;  Surgeon: [name], MD;  Location: [facility] ENDOSCOPY;  Service: Cardiovascular;  Laterality: N/A;   CARDIOVERSION N/A 10/19/2019   Procedure: CARDIOVERSION;   Surgeon: [name], MD;  Location: [facility] ENDOSCOPY;  Service: Cardiovascular;  Laterality: N/A;   CATARACT EXTRACTION W/ INTRAOCULAR LENS IMPLANT Bilateral    CHONDROPLASTY  07/22/2011   Procedure: CHONDROPLASTY;  Surgeon: Dione Plover Aluisio;  Location: [place];  Service: Orthopedics;;   CIRCUMCISION  [place]-- POST CABG   W/ STENT, last cath 01/07/2012    CORONARY ARTERY BYPASS GRAFT  1995   X3 VESSEL   CORONARY ARTERY BYPASS GRAFT  1995   CORONARY STENT PLACEMENT     CYSTOSCOPY/URETEROSCOPY/HOLMIUM LASER/STENT PLACEMENT Right 01/08/2021   Procedure: CYSTOSCOPY/RETROGRADE/URETEROSCOPY/HOLMIUM LASER/STENT PLACEMENT;  Surgeon: Franchot Gallo, MD;  Location: [facility] ORS;  Service: Urology;  Laterality: Right;   IR PERC TUN PERIT CATH WO PORT S&I /IMAG  03/06/2021   KNEE ARTHROSCOPY  07/22/2011   Procedure: ARTHROSCOPY KNEE;  Surgeon: Dione Plover Aluisio;  Location: [place];  Service: Orthopedics;  Laterality: Right;  WITH DEBRIDEMENT    KNEE ARTHROSCOPY W/ MENISCECTOMY  X2 IN 2002-- RIGHT KNEE   LAPAROSCOPIC GASTRIC BANDING  01-15-09   LEFT HEART CATH AND CORONARY ANGIOGRAPHY N/A 04/29/2017   Procedure: LEFT HEART CATH AND CORONARY ANGIOGRAPHY;  Surgeon: [name], MD;  Location: [place] CV LAB;  Service: Cardiovascular;  Laterality: N/A;   LEFT HEART CATHETERIZATION WITH CORONARY ANGIOGRAM N/A 09/11/2013   Procedure: LEFT HEART CATHETERIZATION WITH CORONARY ANGIOGRAM;  Surgeon: [name], MD;  Location: [facility] CATH LAB;  Service: Cardiovascular;  Laterality: N/A;   MITRAL VALVE REPAIR Right 03/25/2016   Procedure: MINIMALLY INVASIVE REOPERATION FOR MITRAL VALVE REPAIR  (MVR) with size 26 Sorin Memo 3D;  Surgeon: [name], MD;  Location: [place];  Service: Open Heart Surgery;  Laterality: Right;   MITRAL VALVE REPAIR  03/2016   RIGHT KNEE ED COMPARTMENT REPLACEMENT  2004   SYNOVECTOMY  07/22/2011    Procedure: SYNOVECTOMY;  Surgeon: Dione Plover Aluisio;  Location: [place];  Service: Orthopedics;;   TEE WITHOUT CARDIOVERSION N/A 01/23/2016   Procedure: TRANSESOPHAGEAL ECHOCARDIOGRAM (TEE);  Surgeon: [name], MD;  Location: [place];  Service: Cardiovascular;  Laterality: N/A;   TEE WITHOUT CARDIOVERSION N/A 03/25/2016   Procedure: TRANSESOPHAGEAL ECHOCARDIOGRAM (TEE);  Surgeon: [name], MD;  Location: [place];  Service: Open Heart Surgery;  Laterality: N/A;   TEE WITHOUT CARDIOVERSION N/A 03/04/2021   Procedure: TRANSESOPHAGEAL ECHOCARDIOGRAM (TEE);  Surgeon: Lelon Perla, MD;  Location: [facility] ENDOSCOPY;  Service: Cardiovascular;  Laterality: N/A;   UMBILICAL HERNIA REPAIR  01-15-09   W/ GASTRIC BANDING PROCEDURE    Short Social History:  Social History   Tobacco Use   Smoking status: Never   Smokeless tobacco: Never  Substance Use Topics   Alcohol use: Not Currently    Alcohol/week: 2.0 standard drinks    Types: 1 Glasses of wine, 1 Shots of liquor per week    Comment: OCCASIONAL    Allergies  Allergen Reactions   Crestor [Rosuvastatin] Other (See Comments)    Muscle aches    Lipitor [Atorvastatin] Other (See Comments)    Muscle aches   Pravastatin Other (See Comments)    Muscle aches   Carvedilol     Other reaction(s): loss of appetite   Metformin Diarrhea   Serotonin Reuptake Inhibitors (Ssris)     Other reaction(s): Muscle aches   Zocor [Simvastatin]     Other reaction(s): Muscle pain   Codeine Itching and Other (See Comments)    Extremity tingling-- can take synthetic    Current Outpatient Medications  Medication Sig Dispense Refill   acetaminophen (TYLENOL) 325 MG tablet Take 2 tablets (650 mg total) by mouth every 6 (six) hours as needed for mild pain (or Fever >/= 101).     Alirocumab (PRALUENT) 75 MG/ML SOAJ Inject 75 mg into the skin every 14 (fourteen) days.     allopurinol (ZYLOPRIM) 100 MG tablet Take 0.5 tablets (50 mg  total) by mouth 2 (two) times a week. Please discuss dosing with your PCP based on your renal function. 4 tablet 0   amiodarone (PACERONE) 200 MG tablet Take 1 tablet (200 mg total) by mouth daily. 60 tablet 0   ampicillin IVPB Inject 2 g into the vein every 8 (eight) hours. Indication:  Enterococcal endocarditis  First Dose: Yes Last Day of Therapy:  04/11/21  Labs - Once weekly:  CBC/D and BMP, Labs - Every other week:  ESR and CRP Method of administration: Ambulatory Pump (Continuous Infusion) Method of administration may be changed at the discretion of home infusion pharmacist based upon assessment of the patient and/or caregiver's ability to self-administer the medication ordered. 114 Units 0   apixaban (ELIQUIS) 5 MG TABS tablet Take 1 tablet (5 mg total) by mouth 2 (two) times daily. 180 tablet 3   Ascorbic Acid (VITAMIN C WITH ROSE HIPS) 1000 MG tablet Take 1,000 mg by mouth daily.     aspirin EC 81 MG EC tablet Take 1 tablet (81 mg total) by mouth daily. Swallow whole. 30 tablet 11   calcitRIOL (ROCALTROL) 0.25 MCG capsule Take 0.25 mcg by mouth every other day.     Carboxymethylcellulose Sodium 1 % GEL APPLY 1 DROP TO EACH EYE AT BEDTIME     cefTRIAXone (ROCEPHIN) IVPB Inject 2 g into the vein every 12 (twelve) hours. Indication:  Enterococcal endocarditis First Dose: Yes Last Day of Therapy:  04/11/21 Labs - Once weekly:  CBC/D and BMP, Labs - Every other week:  ESR and CRP Method of administration: IV Push Method of administration may be changed at the discretion of home infusion pharmacist based upon assessment of the patient and/or caregiver's ability to self-administer the medication ordered. 76 Units 0   cetirizine (ZYRTEC) 10 MG tablet Take 10 mg by mouth daily.     colchicine 0.6 MG tablet Take 0.6-1.2 mg by mouth 2 (two) times daily as needed (gout).  fluticasone (FLONASE) 50 MCG/ACT nasal spray Place 1 spray into both nostrils daily as needed for allergies or rhinitis.      furosemide (LASIX) 40 MG tablet Take 1 tablet (40 mg total) by mouth 2 (two) times daily for 2 days, THEN 1 tablet (40 mg total) daily. 90 tablet 3   HYDROcodone-acetaminophen (NORCO/VICODIN) 5-325 MG tablet Take 1-2 tablets by mouth every 4 (four) hours as needed for severe pain. 30 tablet 0   nitroGLYCERIN (NITROSTAT) 0.4 MG SL tablet 1 TAB UNDER THE TONGUE EVERY 5 MINUTES AS NEEDED FOR CHEST PAIN UP TO 3 DOSES, IF PERSIST CALL 911 75 tablet 1   ONETOUCH VERIO test strip 1 each daily.     pantoprazole (PROTONIX) 40 MG tablet Take 40 mg by mouth daily as needed (heartburn).     Semaglutide,0.25 or 0.5MG/DOS, (OZEMPIC, 0.25 OR 0.5 MG/DOSE,) 2 MG/1.5ML SOPN Inject 0.375 mLs (0.5 mg total) into the skin every Thursday. 3 pen 3   tadalafil (CIALIS) 20 MG tablet TAKE 1 TABLET BY MOUTH ONCE DAILY AS NEEDED 10 tablet 0   traZODone (DESYREL) 100 MG tablet Take 100 mg by mouth at bedtime.     potassium chloride SA (KLOR-CON) 20 MEQ tablet Take 2 tablets (40 mEq total) by mouth daily for 2 days, THEN 1 tablet (20 mEq total) daily for 2 days. 90 tablet 3   No current facility-administered medications for this visit.    Review of Systems  Constitutional:  Constitutional negative. HENT: HENT negative.  Eyes: Eyes negative.  Respiratory: Positive for shortness of breath.  Cardiovascular: Positive for irregular heartbeat and leg swelling.  GI: Gastrointestinal negative.  Musculoskeletal: Positive for gait problem, leg pain and joint pain.  Skin: Skin negative.  Neurological: Positive for dizziness.  Hematologic: Positive for bruises/bleeds easily.  Psychiatric: Psychiatric negative.       Objective:  Objective   Vitals:   03/28/21 1421  BP: 137/87  Pulse: (!) 54  Resp: 20  Temp: 98.3 F (36.8 C)  SpO2: 99%  Weight: 172 lb (78 kg)  Height: _0  (1.778 m)   Body mass index is 24.68 kg/m.  Physical Exam HENT:     Head: Normocephalic.     Nose:     Comments: Wearing a  mask Cardiovascular:     Rate and Rhythm: Normal rate.     Pulses: Normal pulses.     Comments: Large left radiocephalic AV fistula Abdominal:     General: Abdomen is flat.     Palpations: Abdomen is soft.  Musculoskeletal:     Right lower leg: No edema.     Left lower leg: Edema present.     Comments: He has prominent veins throughout his bilateral lower extremities There is a very prominent small saphenous vein on the left which appears to feel dilated.  I cannot feel any discernible palpable mass in his popliteal fossa.  Skin:    Capillary Refill: Capillary refill takes less than 2 seconds.  Neurological:     General: No focal deficit present.     Mental Status: He is alert.  Psychiatric:        Mood and Affect: Mood normal.        Behavior: Behavior normal.        Thought Content: Thought content normal.        Judgment: Judgment normal.    Data: +-----+---------------+---------+-----------+----------+--------------+  RIGHTCompressibilityPhasicitySpontaneityPropertiesThrombus Aging  +-----+---------------+---------+-----------+----------+--------------+  CFV  Full  Yes      Yes                                  +-----+---------------+---------+-----------+----------+--------------+           +---------+---------------+---------+-----------+----------+---------------  ----+  LEFT     CompressibilityPhasicitySpontaneityPropertiesThrombus Aging        +---------+---------------+---------+-----------+----------+---------------  ----+  CFV      Full           Yes      Yes                                        +---------+---------------+---------+-----------+----------+---------------  ----+  SFJ      Full                                                               +---------+---------------+---------+-----------+----------+---------------  ----+  FV Prox  Full                                                                +---------+---------------+---------+-----------+----------+---------------  ----+  FV Mid   Full                                                               +---------+---------------+---------+-----------+----------+---------------  ----+  FV DistalFull                                                               +---------+---------------+---------+-----------+----------+---------------  ----+  PFV      Full                                                               +---------+---------------+---------+-----------+----------+---------------  ----+  POP      Full           Yes      Yes                                        +---------+---------------+---------+-----------+----------+---------------  ----+  PTV      Full                                                               +---------+---------------+---------+-----------+----------+---------------  ----+  PERO     Full                                                               +---------+---------------+---------+-----------+----------+---------------  ----+  Gastroc  Full           No       No                   Dilated  vessels                                                            with rouleaux  flow                                                         observed in  all                                                            gastrocnemius  veins  +---------+---------------+---------+-----------+----------+---------------  ----+     Summary:  RIGHT:  - No evidence of common femoral vein obstruction.     LEFT:  - There is no evidence of deep vein thrombosis in the lower extremity.     - Indicental: A hypervascular, complex structure is found in the popliteal  fossa measuring 4.3 cm x 2.1 cm and containing both internal and  peripheral vasculature.     - Gastrocnemius veins appear markedly dilated with rouleaux  flow.      Assessment/Plan:    75 year old male sustained a fall in early July and was hospitalized.  He had persistent bilateral lower extremity pain he underwent duplex of his left lower extremity to rule out DVT.  He is on Eliquis for atrial fibrillation and DVT study was negative.  There is a dilated gastrocnemius vein which is easily appreciable on physical exam today.  There is also hypervascular structure in his popliteal fossa on the left which I cannot appreciate although there is some fullness there.  Given the patient's chronic kidney disease we cannot really pursue a CT with contrast for further evaluation.  This is not causing much problem at this time and I am unsure exactly what the hypervascular mass is.  Given that is not causing any significant issue at this time we will plan for repeat duplex in 6 to 8 weeks.  Certainly if he has issues prior to that I can see him sooner.     Waynetta Sandy MD [facility]

## 2021-03-31 ENCOUNTER — Other Ambulatory Visit: Payer: Self-pay

## 2021-03-31 DIAGNOSIS — R52 Pain, unspecified: Secondary | ICD-10-CM

## 2021-03-31 DIAGNOSIS — N261 Atrophy of kidney (terminal): Secondary | ICD-10-CM | POA: Diagnosis not present

## 2021-03-31 DIAGNOSIS — R972 Elevated prostate specific antigen [PSA]: Secondary | ICD-10-CM | POA: Diagnosis not present

## 2021-03-31 DIAGNOSIS — N200 Calculus of kidney: Secondary | ICD-10-CM | POA: Diagnosis not present

## 2021-04-01 ENCOUNTER — Encounter: Payer: Self-pay | Admitting: Diagnostic Neuroimaging

## 2021-04-01 ENCOUNTER — Other Ambulatory Visit: Payer: Self-pay

## 2021-04-01 ENCOUNTER — Ambulatory Visit: Payer: Medicare Other | Attending: Nurse Practitioner

## 2021-04-01 ENCOUNTER — Ambulatory Visit: Payer: Medicare Other

## 2021-04-01 ENCOUNTER — Ambulatory Visit (INDEPENDENT_AMBULATORY_CARE_PROVIDER_SITE_OTHER): Payer: Medicare Other | Admitting: Diagnostic Neuroimaging

## 2021-04-01 VITALS — BP 113/64 | HR 59 | Ht 70.5 in | Wt 175.0 lb

## 2021-04-01 DIAGNOSIS — R29898 Other symptoms and signs involving the musculoskeletal system: Secondary | ICD-10-CM

## 2021-04-01 DIAGNOSIS — R41841 Cognitive communication deficit: Secondary | ICD-10-CM | POA: Diagnosis not present

## 2021-04-01 DIAGNOSIS — I6330 Cerebral infarction due to thrombosis of unspecified cerebral artery: Secondary | ICD-10-CM

## 2021-04-01 NOTE — Patient Instructions (Signed)
Memory Compensation Strategies  Use "WARM" strategy. W= write it down A=  associate it R=  repeat it M=  make a mental picture  You can keep a Memory Notebook. Use a 3-ring notebook with sections for the following:  calendar, important names and phone numbers, medications, doctors' names/phone numbers, "to do list"/reminders, and a section to journal what you did each day  Use a calendar to write appointments down.  Write yourself a schedule for the day.  This can be placed on the calendar or in a separate section of the Memory Notebook.  Keeping a regular schedule can help memory.  Use medication organizer with sections for each day or morning/evening pills  You may need help loading it  Keep a basket, or pegboard by the door.   Place items that you need to take out with you in the basket or on the pegboard.  You may also want to include a message board for reminders.  Use sticky notes. Place sticky notes with reminders in a place where the task is performed.  For example:  "turn off the stove" placed by the stove, "lock the door" placed on the door at eye level, "take your medications" on the bathroom mirror or by the place where you normally take your medications  Use alarms/timers.  Use while cooking to remind yourself to check on food or as a reminder to take your medicine, or as a reminder to make a call, or as a reminder to perform another task, etc.  Use a voice recorder app or small tape recorder to record important information and notes for yourself. Go back at the end of the day and listen to these.

## 2021-04-01 NOTE — Therapy (Signed)
[facility] [address] [place], [place], [address] Phone: [phone]   Fax:  [phone]  Speech Language Pathology Evaluation  Patient Details  Name: [name] MRN: [id number] Date of Birth: 12/19/1945 Referring Provider (SLP): Fenton Foy, NP   Encounter Date: 04/01/2021   End of Session - 04/01/21 1323     Visit Number 1    Number of Visits 17    Date for SLP Re-Evaluation 05/27/21    SLP Start Time 78    SLP Stop Time  7035    SLP Time Calculation (min) 45 min    Activity Tolerance Patient tolerated treatment well             Past Medical History:  Diagnosis Date   Allergic rhinitis    Anxiety    Atrial fibrillation ([place])    Cancer ([place])    hx of skin cancers    Chronic kidney disease    Coronary artery disease    a. s/p MI & CABG; b. s/p PCI Diag;  c. Cath 12/2011: LAD diff dzs/small, Diag 75% isr, 90 dist to stent (small), LCX & RCA occluded, VG->OM 40, VG->PDA patent - med rx.   COVID-19    x 3   Depression    PTSD   Diabetes mellitus    not on medications   Diastolic CHF, chronic ([place])    a. EF 55-60% by echo 2007   GERD (gastroesophageal reflux disease)    "not anymore" " I had a lap band"   History of diverticulitis of colon    5 YRS AGO   History of pneumonia    2017   History of transient ischemic attack (TIA)    CAROTID DOPPLERS NOV 2011  0-39& BIL. STENOSIS   Hyperlipidemia    Hypertension    Kidney failure    Mitral regurgitation    Myocardial infarction [facility])    Neuromuscular disorder (HCC)    left arm numbness    OA (osteoarthritis)    RIGHT KNEE ARTHOFIBROSIS W/ PAIN  (S/P  REPLACEMENT 2004)   PTSD (post-traumatic stress disorder)    from [place] since 1973   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/25/2016   Complex valvuloplasty including artificial Gore-tex neochord placement x4, plication of anterior commissure, and 26 mm Sorin Memo 3D ring  annuloplasty via right mini thoracotomy approach   Shortness of breath dyspnea    with exertion   Skin cancer    Sleep apnea    cpap- see ov note in EPIC 05/12/11 for settings    Stroke [facility])    hx of   Stroke [facility])     Past Surgical History:  Procedure Laterality Date   AV FISTULA PLACEMENT Left 08/02/2018   Procedure: ARTERIOVENOUS (AV) FISTULA CREATION RADIOCEPHALIC;  Surgeon: Angelia Mould, MD;  Location: [place];  Service: Vascular;  Laterality: Left;   BLEPHAROPLASTY  [place]  2001, 2004, 2009, 2011   CARDIAC CATHETERIZATION N/A 01/23/2016   Procedure: Right/Left Heart Cath and Coronary Angiography;  Surgeon: [name], MD;  Location: [place] CV LAB;  Service: Cardiovascular;  Laterality: N/A;   CARDIOVERSION N/A 09/07/2017   Procedure: CARDIOVERSION;  Surgeon: [name], MD;  Location: [facility] ENDOSCOPY;  Service: Cardiovascular;  Laterality: N/A;   CARDIOVERSION N/A 09/13/2019   Procedure: CARDIOVERSION;  Surgeon: [name], MD;  Location: [place];  Service: Cardiovascular;  Laterality: N/A;  CARDIOVERSION N/A 10/19/2019   Procedure: CARDIOVERSION;  Surgeon: [name], MD;  Location: [facility] ENDOSCOPY;  Service: Cardiovascular;  Laterality: N/A;   CATARACT EXTRACTION W/ INTRAOCULAR LENS IMPLANT Bilateral    CHONDROPLASTY  07/22/2011   Procedure: CHONDROPLASTY;  Surgeon: Dione Plover Aluisio;  Location: [place];  Service: Orthopedics;;   CIRCUMCISION  [place]-- POST CABG   W/ STENT, last cath 01/07/2012    CORONARY ARTERY BYPASS GRAFT  1995   X3 VESSEL   CORONARY ARTERY BYPASS GRAFT  1995   CORONARY STENT PLACEMENT     CYSTOSCOPY/URETEROSCOPY/HOLMIUM LASER/STENT PLACEMENT Right 01/08/2021   Procedure: CYSTOSCOPY/RETROGRADE/URETEROSCOPY/HOLMIUM LASER/STENT PLACEMENT;  Surgeon: Franchot Gallo, MD;  Location: [facility];  Service: Urology;  Laterality:  Right;   IR PERC TUN PERIT CATH WO PORT S&I /IMAG  03/06/2021   KNEE ARTHROSCOPY  07/22/2011   Procedure: ARTHROSCOPY KNEE;  Surgeon: Dione Plover Aluisio;  Location: [place];  Service: Orthopedics;  Laterality: Right;  WITH DEBRIDEMENT    KNEE ARTHROSCOPY W/ MENISCECTOMY  X2 IN 2002-- RIGHT KNEE   LAPAROSCOPIC GASTRIC BANDING  01-15-09   LEFT HEART CATH AND CORONARY ANGIOGRAPHY N/A 04/29/2017   Procedure: LEFT HEART CATH AND CORONARY ANGIOGRAPHY;  Surgeon: [name], MD;  Location: [place] CV LAB;  Service: Cardiovascular;  Laterality: N/A;   LEFT HEART CATHETERIZATION WITH CORONARY ANGIOGRAM N/A 09/11/2013   Procedure: LEFT HEART CATHETERIZATION WITH CORONARY ANGIOGRAM;  Surgeon: [name], MD;  Location: [facility] CATH LAB;  Service: Cardiovascular;  Laterality: N/A;   MITRAL VALVE REPAIR Right 03/25/2016   Procedure: MINIMALLY INVASIVE REOPERATION FOR MITRAL VALVE REPAIR  (MVR) with size 26 Sorin Memo 3D;  Surgeon: [name], MD;  Location: [place];  Service: Open Heart Surgery;  Laterality: Right;   MITRAL VALVE REPAIR  03/2016   RIGHT KNEE ED COMPARTMENT REPLACEMENT  2004   SYNOVECTOMY  07/22/2011   Procedure: SYNOVECTOMY;  Surgeon: Dione Plover Aluisio;  Location: [place];  Service: Orthopedics;;   TEE WITHOUT CARDIOVERSION N/A 01/23/2016   Procedure: TRANSESOPHAGEAL ECHOCARDIOGRAM (TEE);  Surgeon: [name], MD;  Location: [place];  Service: Cardiovascular;  Laterality: N/A;   TEE WITHOUT CARDIOVERSION N/A 03/25/2016   Procedure: TRANSESOPHAGEAL ECHOCARDIOGRAM (TEE);  Surgeon: [name], MD;  Location: [place];  Service: Open Heart Surgery;  Laterality: N/A;   TEE WITHOUT CARDIOVERSION N/A 03/04/2021   Procedure: TRANSESOPHAGEAL ECHOCARDIOGRAM (TEE);  Surgeon: Lelon Perla, MD;  Location: [facility] ENDOSCOPY;  Service: Cardiovascular;  Laterality: N/A;   UMBILICAL HERNIA REPAIR  01-15-09   W/ GASTRIC BANDING PROCEDURE    There were no  vitals filed for this visit.       SLP Evaluation [facility] - 04/01/21 1230       SLP Visit Information   SLP Received On 01/29/21    Referring Provider (SLP) Fenton Foy, NP    Onset Date May 2022   pt reported having COVID 3x   Medical Diagnosis History of COVID-19, memory loss   stroke in June 2022     Subjective   Patient/Family Stated Goal to improve memory and reduce frustration      Pain Assessment   Pain Score 6     Pain Location Toe (Comment which one)   right- bruised   Pain Orientation Right    Pain Frequency Intermittent      General Information  Mobility Status walks with cane - currently doing [facility] PT/OT      Balance Screen   Has the patient fallen in the past 6 months Yes    How many times? "lost count" 7x minimum since May      Prior Functional Status   Cognitive/Linguistic Baseline Within functional limits    Type of Home House     Lives With Spouse;Family    Available Support Family    Education Master's    Vocation Retired   Psychologist, forensic     Cognition   Overall Cognitive Status Impaired/Different from baseline    Area of Impairment Attention;Memory;Awareness;Problem solving    Current Attention Level Sustained    Attention Comments reduced selective and sustained attention    Memory Decreased short-term memory    Memory Comments wife reports pt forgets if he's taken his meds    Awareness Anticipatory    Awareness Comments reduced error awareness and decreased awareness of extent of current cognitive deficits    Problem Solving Slow processing;Difficulty sequencing;Requires verbal cues    Problem Solving Comments difficulty correcting errors      Auditory Comprehension   Overall Auditory Comprehension Appears within functional limits for tasks assessed      Expression   Primary Mode of Expression Verbal      Verbal Expression   Overall Verbal Expression Appears within functional limits for tasks assessed      Oral  Motor/Sensory Function   Overall Oral Motor/Sensory Function Appears within functional limits for tasks assessed      Motor Speech   Overall Motor Speech Appears within functional limits for tasks assessed      Standardized Assessments   Standardized Assessments  Cognitive Linguistic Quick Test                             SLP Education - 04/01/21 1323     Education Details eval results, possible goals, memory compensations    Person(s) Educated Patient;Spouse    Methods Explanation;Demonstration;Handout    Comprehension Verbalized understanding;Returned demonstration;Need further instruction              SLP Short Term Goals - 04/01/21 1339       SLP SHORT TERM GOAL #1   Title Pt will use memory compensations for appointments, medicine management, and other daily activities with occasional min A over 2 sessions    Time 4    Period Weeks    Status New    Target Date 05/02/21      SLP SHORT TERM GOAL #2   Title Pt will demonstrate selective attention to auditory/written instructions for completion of simple-mod complex task given 2 or less repetitions over 2 sessions    Time 4    Period Weeks    Status New    Target Date 05/02/21      SLP SHORT TERM GOAL #3   Title Pt will ID and correct errors on structured cognitive tasks with 80% accuracy given occasional min A over 2 sessions    Time 4    Period Weeks    Status New    Target Date 05/02/21      SLP SHORT TERM GOAL #4   Title Pt will complete simple to mod complex executive functioning tasks with 80% accuracy given occasional min A over 2 sessions    Time 4    Period Weeks    Status New    Target  Date 05/02/21      SLP SHORT TERM GOAL #5   Title Pt/caregiver will complete cognitive PROM in first 1-2 sessions    Time 4    Period Weeks    Status New    Target Date 05/02/21              SLP Long Term Goals - 04/01/21 1347       SLP LONG TERM GOAL #1   Title Pt will use memory  compensations for appointments, medicine management, and other daily activities with rare min A over 2 sessions    Time 8    Period Weeks    Status New    Target Date 05/30/21      SLP LONG TERM GOAL #2   Title Pt will ID and correct errors on structured cognitive tasks with 90% accuracy given rare min A over 2 sessions    Time 8    Period Weeks    Status New    Target Date 05/30/21      SLP LONG TERM GOAL #3   Title Pt will complete simple to mod complex executive functioning tasks with 80% accuracy given rare min A over 2 sessions    Time 8    Period Weeks    Status New    Target Date 05/30/21      SLP LONG TERM GOAL #4   Title Pt/caregiver will report less frustration and report improved cognitive functioning via PROM by 2 past at last ST session    Time 8    Period Weeks    Status New    Target Date 05/30/21              Plan - 04/01/21 1325     Clinical Impression Statement [name] presents to OPST evaluation secondary to memory loss following COVID-19. Today pt is accompanied by his wife, [name]. Pt reports he has had COVID 3x (07/2018, 05/2020, & 12/2020) as well as a stroke in June 2022. Pt's wife reports changes in memory and slower processing following COVID-19 in May 2022, with no other significant changes noted since stroke in June. Pt reports he sometimes questions himself if he has taken his medications or completed a task. Wife indicated she provides usual cues to aid memory at home, which often frustrates both patient and his wife. Pt exhibited some reduced awareness of his cognitive challenges during intial introductory conversation, as wife provided majority of examples. 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 Pt is currently receiving HH PT/OT with frequent falls reported. CLQT completed this session, which revealed mild memory deficits. All other subtests WNL for age; however, SLP noted usual overt difficulty with attention, memory,  and executive functioning. Pt exhibited  difficulty with recall, following instructions, and identification/correction of errors on clock drawing, design generation, mazes, and symbol trails tasks. Pt inconsistently aware of errors. Pt benefited from SLP repetition of instructions and cues to attend to instructions prior to initiating tasks due to impulsivity. Pt verbalized awareness of how certain tasks were very challenging, which was reportedly unexpected. Pt desires to return to baseline. Skilled ST is warranted to address cognitive communication skills to improve functional independence and safety as well as reduce caregiver burden and frustration related to cognitive impairment.    Speech Therapy Frequency 2x / week    Duration 8 weeks    Treatment/Interventions Compensatory strategies;Cueing hierarchy;Functional tasks;Patient/family education;Cognitive reorganization;Compensatory techniques;Internal/external aids;SLP instruction and feedback;Environmental controls  Potential to [place] provided    Consulted and Agree with Plan of Care Patient;Family member/caregiver    Family Member Consulted wife, [name]             Patient will benefit from skilled therapeutic intervention in order to improve the following deficits and impairments:   Cognitive communication deficit    Problem List Patient Active Problem List   Diagnosis Date Noted   Enterococcal bacteremia    History of stroke    Prosthetic valve endocarditis ([place])    Bacteremia due to Gram-positive bacteria 02/27/2021   Anemia 02/26/2021   Weakness generalized 02/26/2021   CKD (chronic kidney disease) stage 4, GFR 15-29 ml/min ([place]) 02/26/2021   Atrial fibrillation, chronic ([place]) 02/26/2021   Chronic anticoagulation 02/26/2021   Prolonged QT interval 02/26/2021   Depressive disorder 02/25/2021   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[place]) 02/25/2021   Squamous cell carcinoma of skin of face 02/25/2021    Actinic keratosis 02/25/2021   Anxiety state 02/25/2021   Basal cell carcinoma of nose 02/25/2021   Basal cell carcinoma of skin of unspecified ear and external auricular canal 02/25/2021   Benign prostatic hyperplasia with lower urinary tract symptoms 02/25/2021   Bilateral pseudophakia 02/25/2021   Carcinoma in situ of skin of other parts of face 02/25/2021   Dental caries on smooth surface penetrating into dentin 02/25/2021   Deposits (accretions) on teeth 02/25/2021   Diverticulitis of colon 02/25/2021   Encounter for fitting and adjustment of hearing aid 02/25/2021   Encounter for removal of sutures 02/25/2021   Esophageal reflux 02/25/2021   Generalized osteoarthrosis, involving multiple sites 02/25/2021   Gout, unspecified 02/25/2021   History of other malignant neoplasm of skin 02/25/2021   Male erectile dysfunction, unspecified 02/25/2021   Peptic ulcer 02/25/2021   Post-traumatic stress disorder, unspecified 02/25/2021   Psychosexual dysfunction with inhibited sexual excitement 02/25/2021   Sensorineural hearing loss, bilateral 02/25/2021   Unspecified atrial fibrillation ([place]) 02/25/2021   Protein-calorie malnutrition, severe 02/04/2021   Cerebral thrombosis with cerebral infarction 02/02/2021   Right sided weakness 02/01/2021   History of COVID-19 01/29/2021   Memory loss 01/29/2021   Olfactory impairment 01/29/2021   Gait disturbance 01/29/2021   Fatigue 01/29/2021   Physical deconditioning 01/29/2021   Pressure injury of skin 01/07/2021   AKI (acute kidney injury) ([place]) 01/06/2021   Generalized weakness 01/06/2021   ARF (acute renal failure) ([place]) 01/06/2021   Dyspnea 10/05/2020   CHF (congestive heart failure) ([place]) 10/04/2020   Carpal tunnel syndrome of right wrist 09/25/2020   Pain of left hand 09/25/2020   COVID-19 virus infection 05/24/2020   Hypertensive chronic kidney disease with stage 1 through stage 4 chronic kidney disease, or unspecified chronic  kidney disease    Coronary artery disease    Type II diabetes mellitus ([place])    Hypertension    Back pain 04/08/2020   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/05/2019   Pain in right foot 03/22/2018   Cellulitis of left foot 03/01/2018   Pain of joint of left ankle and foot 09/06/2017   Cancer ([place])    Other specified counseling 04/03/2016   Pneumothorax on right 03/29/2016   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/25/2016   Atherosclerotic heart disease of native coronary artery without angina pectoris 03/10/2016   Heart failure ([place]) 03/10/2016   Cataract extraction status of eye 03/10/2016   Other long term (current) drug therapy 03/10/2016   Osteoarthritis of knee 03/10/2016  Vitamin D deficiency 03/10/2016   Mitral regurgitation    Avitaminosis D 12/25/2015   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   Arthritis of knee, degenerative 12/25/2015   H/O cataract extraction 12/25/2015   Atherosclerotic heart disease of native coronary artery with other forms of angina pectoris ([place]) 12/25/2015   Congestive heart failure ([place]) 12/25/2015   Pure hypercholesterolemia 12/25/2015   Allergic rhinitis 12/25/2015   Essential (primary) hypertension 12/25/2015   Hypotension 01/29/2014   Expected Blood loss, postoperative 04/29/2012   Pain due to unicompartmental arthroplasty of knee ([place]) 04/27/2012   Preoperative evaluation of a medical condition to rule out surgical contraindications (TAR required) 70/78/6754   Diastolic CHF, chronic ([place]) 06/29/2011   Preoperative evaluation to rule out surgical contraindication 06/29/2011   CAROTID ARTERY STENOSIS 07/09/2010   TIA 05/29/2010   FATIGUE / MALAISE 05/29/2010   CAD, AUTOLOGOUS BYPASS GRAFT 11/15/2008   MYOCARDIAL INFARCTION 12/18/2007   ALLERGIC RHINITIS WITH CONJUNCTIVITIS 12/18/2007   Type 2 diabetes mellitus without complications ([place]) 49/20/1007   Other and unspecified hyperlipidemia 12/05/2007   Other ill-defined and unknown causes of  morbidity and mortality 12/05/2007   Essential hypertension 12/05/2007   Coronary atherosclerosis 12/05/2007   DEGENERATIVE JOINT DISEASE 12/05/2007   Obstructive sleep apnea of adult 12/05/2007    Alinda Deem, MA CCC-SLP 04/01/2021, 2:07 PM  [facility] [address] [place], [place], [address] Phone: [phone]   Fax:  [phone]  Name: [name] MRN: [id number] Date of Birth: 02-02-1946

## 2021-04-01 NOTE — Progress Notes (Signed)
[facility]  PATIENT: [name] DOB: [birth year]  REFERRING CLINICIAN: [name], MD HISTORY FROM: PATIENT  REASON FOR VISIT: NEW CONSULT    HISTORICAL  CHIEF COMPLAINT:  Chief Complaint  Patient presents with   Post Covid issues, gait issues    Rm 7 new referral, wife- [name]    HISTORY OF PRESENT ILLNESS:  UPDATE (04/01/2021, VRP): Since last visit, was hospitalized in June 2022 for right-sided weakness affecting the right arm and leg.  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  She was hospitalized again in July 2022 for hematoma and then bacteremia.  Patient referred here for evaluation of follow-up of right foot drop which developed after stroke in June 2022.  Right foot drop weakness is slightly improving with physical therapy.  He is having significantly excessive bleeding since aspirin was added to Eliquis in June 2022.  Patient and family are requesting to stop aspirin.  PRIOR HPI (11/06/20): 75 year old male here evaluation of left-sided weakness, numbness, gait difficulty.  Patient presented to hospital on 10/04/2020 for ataxia, numbness, weakness, shortness of breath and dyspnea on exertion.  Patient was admitted for evaluation.  Cardiac and neurology evaluation were completed.  MRI of the brain was negative for acute stroke.  Patient was already on Eliquis and statin.  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  Since that time patient has some issues with veering towards the left side, especially when he wakes up in the middle the night to use the bathroom.  During the daytime he feels better.  Sometimes if he sleeps with his arms folded across his chest he feels some numbness in his hands.  Symptoms improved if he straightens his arms out.   REVIEW OF SYSTEMS: Full 14 system review of systems performed and negative with exception of: as per HPI.  ALLERGIES: Allergies  Allergen Reactions   Crestor  [Rosuvastatin] Other (See Comments)    Muscle aches    Lipitor [Atorvastatin] Other (See Comments)    Muscle aches   Pravastatin Other (See Comments)    Muscle aches   Carvedilol     Other reaction(s): loss of appetite   Metformin Diarrhea   Serotonin Reuptake Inhibitors (Ssris)     Other reaction(s): Muscle aches   Zocor [Simvastatin]     Other reaction(s): Muscle pain   Codeine Itching and Other (See Comments)    Extremity tingling-- can take synthetic    HOME MEDICATIONS: Outpatient Medications Prior to Visit  Medication Sig Dispense Refill   acetaminophen (TYLENOL) 325 MG tablet Take 2 tablets (650 mg total) by mouth every 6 (six) hours as needed for mild pain (or Fever >/= 101).     Alirocumab (PRALUENT) 75 MG/ML SOAJ Inject 75 mg into the skin every 14 (fourteen) days.     allopurinol (ZYLOPRIM) 100 MG tablet Take 0.5 tablets (50 mg total) by mouth 2 (two) times a week. Please discuss dosing with your PCP based on your renal function. 4 tablet 0   amiodarone (PACERONE) 200 MG tablet Take 1 tablet (200 mg total) by mouth daily. 60 tablet 0   ampicillin IVPB Inject 2 g into the vein every 8 (eight) hours. Indication:  Enterococcal endocarditis  First Dose: Yes Last Day of Therapy:  04/11/21  Labs - Once weekly:  CBC/D and BMP, Labs - Every other week:  ESR and CRP Method of administration: Ambulatory Pump (Continuous Infusion) Method of administration may be changed at  the discretion of home infusion pharmacist based upon assessment of the patient and/or caregiver's ability to self-administer the medication ordered. 114 Units 0   apixaban (ELIQUIS) 5 MG TABS tablet Take 1 tablet (5 mg total) by mouth 2 (two) times daily. 180 tablet 3   Ascorbic Acid (VITAMIN C WITH ROSE HIPS) 1000 MG tablet Take 1,000 mg by mouth daily.     aspirin EC 81 MG EC tablet Take 1 tablet (81 mg total) by mouth daily. Swallow whole. 30 tablet 11   calcitRIOL (ROCALTROL) 0.25 MCG capsule Take 0.25 mcg by  mouth every other day.     Carboxymethylcellulose Sodium 1 % GEL APPLY 1 DROP TO EACH EYE AT BEDTIME     cefTRIAXone (ROCEPHIN) IVPB Inject 2 g into the vein every 12 (twelve) hours. Indication:  Enterococcal endocarditis First Dose: Yes Last Day of Therapy:  04/11/21 Labs - Once weekly:  CBC/D and BMP, Labs - Every other week:  ESR and CRP Method of administration: IV Push Method of administration may be changed at the discretion of home infusion pharmacist based upon assessment of the patient and/or caregiver's ability to self-administer the medication ordered. 76 Units 0   cetirizine (ZYRTEC) 10 MG tablet Take 10 mg by mouth daily.     colchicine 0.6 MG tablet Take 0.6-1.2 mg by mouth 2 (two) times daily as needed (gout).      fluticasone (FLONASE) 50 MCG/ACT nasal spray Place 1 spray into both nostrils daily as needed for allergies or rhinitis.     furosemide (LASIX) 40 MG tablet Take 1 tablet (40 mg total) by mouth 2 (two) times daily for 2 days, THEN 1 tablet (40 mg total) daily. 90 tablet 3   HYDROcodone-acetaminophen (NORCO/VICODIN) 5-325 MG tablet Take 1-2 tablets by mouth every 4 (four) hours as needed for severe pain. 30 tablet 0   nitroGLYCERIN (NITROSTAT) 0.4 MG SL tablet 1 TAB UNDER THE TONGUE EVERY 5 MINUTES AS NEEDED FOR CHEST PAIN UP TO 3 DOSES, IF PERSIST CALL 911 75 tablet 1   ONETOUCH VERIO test strip 1 each daily.     pantoprazole (PROTONIX) 40 MG tablet Take 40 mg by mouth daily as needed (heartburn).     Semaglutide,0.25 or 0.5MG/DOS, (OZEMPIC, 0.25 OR 0.5 MG/DOSE,) 2 MG/1.5ML SOPN Inject 0.375 mLs (0.5 mg total) into the skin every Thursday. 3 pen 3   tadalafil (CIALIS) 20 MG tablet TAKE 1 TABLET BY MOUTH ONCE DAILY AS NEEDED 10 tablet 0   traZODone (DESYREL) 100 MG tablet Take 100 mg by mouth at bedtime.     potassium chloride SA (KLOR-CON) 20 MEQ tablet Take 2 tablets (40 mEq total) by mouth daily for 2 days, THEN 1 tablet (20 mEq total) daily for 2 days. 90 tablet 3    No facility-administered medications prior to visit.    PAST MEDICAL HISTORY: Past Medical History:  Diagnosis Date   Allergic rhinitis    Anxiety    Atrial fibrillation ([place])    Cancer (HCC)    hx of skin cancers    Chronic kidney disease    Coronary artery disease    a. s/p MI & CABG; b. s/p PCI Diag;  c. Cath 12/2011: LAD diff dzs/small, Diag 75% isr, 90 dist to stent (small), LCX & RCA occluded, VG->OM 40, VG->PDA patent - med rx.   COVID-19    x 3   Depression    PTSD   Diabetes mellitus    not on medications   Diastolic  CHF, chronic ([place])    a. EF 55-60% by echo 2007   GERD (gastroesophageal reflux disease)    "not anymore" " I had a lap band"   History of diverticulitis of colon    5 YRS AGO   History of pneumonia    2017   History of transient ischemic attack (TIA)    CAROTID DOPPLERS NOV 2011  0-39& BIL. 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   Complex valvuloplasty including artificial Gore-tex neochord placement x4, plication of anterior commissure, and 26 mm Sorin Memo 3D ring annuloplasty via right mini thoracotomy approach   Shortness of breath dyspnea    with exertion   Skin cancer    Sleep apnea    cpap- see ov note in EPIC 05/12/11 for settings    Stroke ([place])    hx of   Stroke [facility])     PAST SURGICAL HISTORY: Past Surgical History:  Procedure Laterality Date   AV FISTULA PLACEMENT Left 08/02/2018   Procedure: ARTERIOVENOUS (AV) FISTULA CREATION RADIOCEPHALIC;  Surgeon: Angelia Mould, MD;  Location: [place];  Service: Vascular;  Laterality: Left;   BLEPHAROPLASTY  [place]  2001, 2004, 2009, 2011   CARDIAC CATHETERIZATION N/A 01/23/2016   Procedure: Right/Left Heart Cath and Coronary Angiography;  Surgeon: [name], MD;  Location: [place] CV LAB;  Service: Cardiovascular;  Laterality: N/A;   CARDIOVERSION N/A 09/07/2017   Procedure: CARDIOVERSION;  Surgeon: [name], MD;  Location: [facility] ENDOSCOPY;  Service: Cardiovascular;  Laterality: N/A;   CARDIOVERSION N/A 09/13/2019   Procedure: CARDIOVERSION;  Surgeon: [name], MD;  Location: [facility] ENDOSCOPY;  Service: Cardiovascular;  Laterality: N/A;   CARDIOVERSION N/A 10/19/2019   Procedure: CARDIOVERSION;  Surgeon: [name], MD;  Location: [facility] ENDOSCOPY;  Service: Cardiovascular;  Laterality: N/A;   CATARACT EXTRACTION W/ INTRAOCULAR LENS IMPLANT Bilateral    CHONDROPLASTY  07/22/2011   Procedure: CHONDROPLASTY;  Surgeon: Dione Plover Aluisio;  Location: [place];  Service: Orthopedics;;   CIRCUMCISION  [place]-- POST CABG   W/ STENT, last cath 01/07/2012    CORONARY ARTERY BYPASS GRAFT  1995   X3 VESSEL   CORONARY ARTERY BYPASS GRAFT  1995   CORONARY STENT PLACEMENT     CYSTOSCOPY/URETEROSCOPY/HOLMIUM LASER/STENT PLACEMENT Right 01/08/2021   Procedure: CYSTOSCOPY/RETROGRADE/URETEROSCOPY/HOLMIUM LASER/STENT PLACEMENT;  Surgeon: Franchot Gallo, MD;  Location: [facility];  Service: Urology;  Laterality: Right;   IR PERC TUN PERIT CATH WO PORT S&I /IMAG  03/06/2021   KNEE ARTHROSCOPY  07/22/2011   Procedure: ARTHROSCOPY KNEE;  Surgeon: Dione Plover Aluisio;  Location: [place];  Service: Orthopedics;  Laterality: Right;  WITH DEBRIDEMENT    KNEE ARTHROSCOPY W/ MENISCECTOMY  X2 IN 2002-- RIGHT KNEE   LAPAROSCOPIC GASTRIC BANDING  01-15-09   LEFT HEART CATH AND CORONARY ANGIOGRAPHY N/A 04/29/2017   Procedure: LEFT HEART CATH AND CORONARY ANGIOGRAPHY;  Surgeon: [name], MD;  Location: [place] [facility]  LAB;  Service: Cardiovascular;  Laterality: N/A;   LEFT HEART CATHETERIZATION WITH CORONARY ANGIOGRAM N/A 09/11/2013   Procedure: LEFT HEART CATHETERIZATION WITH CORONARY ANGIOGRAM;  Surgeon: [name], MD;  Location: [facility] CATH LAB;  Service: Cardiovascular;  Laterality: N/A;   MITRAL VALVE REPAIR Right 03/25/2016   Procedure: MINIMALLY INVASIVE REOPERATION FOR MITRAL VALVE REPAIR  (MVR) with size 26 Sorin Memo 3D;  Surgeon: [name], MD;  Location: [place];  Service: Open Heart Surgery;  Laterality: Right;   MITRAL VALVE REPAIR  03/2016   RIGHT KNEE ED COMPARTMENT REPLACEMENT  2004   SYNOVECTOMY  07/22/2011   Procedure: SYNOVECTOMY;  Surgeon: Dione Plover Aluisio;  Location: [place];  Service: Orthopedics;;   TEE WITHOUT CARDIOVERSION N/A 01/23/2016   Procedure: TRANSESOPHAGEAL ECHOCARDIOGRAM (TEE);  Surgeon: [name], MD;  Location: [place];  Service: Cardiovascular;  Laterality: N/A;   TEE WITHOUT CARDIOVERSION N/A 03/25/2016   Procedure: TRANSESOPHAGEAL ECHOCARDIOGRAM (TEE);  Surgeon: [name], MD;  Location: [place];  Service: Open Heart Surgery;  Laterality: N/A;   TEE WITHOUT CARDIOVERSION N/A 03/04/2021   Procedure: TRANSESOPHAGEAL ECHOCARDIOGRAM (TEE);  Surgeon: Lelon Perla, MD;  Location: [facility] ENDOSCOPY;  Service: Cardiovascular;  Laterality: N/A;   UMBILICAL HERNIA REPAIR  01-15-09   W/ GASTRIC BANDING PROCEDURE    FAMILY HISTORY: Family History  Problem Relation Age of Onset   Other Mother        joint problems   Alzheimer's disease Mother    Hypertension Father    Heart disease Father    CVA Father        age 44   Depression Father    Heart disease Sister        CABG   Stroke Sister    Heart failure Sister        congestive   Diabetes Sister    Heart disease Brother    Hypertension Brother    Congestive Heart Failure Brother     SOCIAL HISTORY: Social History   Socioeconomic History   Marital status: Married    Spouse  name: [name]   Number of children: 1   Years of education: Not on file   Highest education level: Master's degree (e.g., MA, MS, MEng, MEd, MSW, MBA)  Occupational History    Comment: Chief Financial Officer retired  Tobacco Use   Smoking status: Never   Smokeless tobacco: Never  Vaping Use   Vaping Use: Never used  Substance and Sexual Activity   Alcohol use: Not Currently    Alcohol/week: 2.0 standard drinks    Types: 1 Glasses of wine, 1 Shots of liquor per week    Comment: OCCASIONAL   Drug use: Never   Sexual activity: Not on file  Other Topics Concern   Not on file  Social History Narrative   Lives with wife   occas soda   Social Determinants of Health   Financial Resource Strain: Not on file  Food Insecurity: No Food Insecurity   Worried About Charity fundraiser in the Last Year: Never true   [place] in the Last Year: Never true  Transportation Needs: No Transportation Needs   Lack of Transportation (Medical): No   Lack of Transportation (Non-Medical): No  Physical Activity: Not on file  Stress: Not on file  Social Connections: Not on file  Intimate Partner Violence: Not on file     PHYSICAL EXAM  GENERAL EXAM/CONSTITUTIONAL: Vitals:  Vitals:  04/01/21 1139  BP: 113/64  Pulse: (!) 59  Weight: 175 lb (79.4 kg)  Height: 5' 10.5" (1.791 m)   Body mass index is 24.75 kg/m. Wt Readings from Last 3 Encounters:  04/01/21 175 lb (79.4 kg)  03/28/21 172 lb (78 kg)  03/25/21 173 lb (78.5 kg)   Patient is in no distress; well developed, nourished and groomed; neck is supple  CARDIOVASCULAR: Examination of carotid arteries is normal; no carotid bruits Regular rate and rhythm, no murmurs Examination of peripheral vascular system by observation and palpation is normal  EYES: Ophthalmoscopic exam of optic discs and posterior segments is normal; no papilledema or hemorrhages No results found.  MUSCULOSKELETAL: Gait, strength, tone, movements noted  in Neurologic exam below  NEUROLOGIC: MENTAL STATUS:  No flowsheet data found. awake, alert, oriented to person, place and time recent and remote memory intact normal attention and concentration language fluent, comprehension intact, naming intact fund of knowledge appropriate  CRANIAL NERVE:  2nd - no papilledema on fundoscopic exam 2nd, 3rd, 4th, 6th - pupils equal and reactive to light, visual fields full to confrontation, extraocular muscles intact, no nystagmus 5th - facial sensation symmetric 7th - facial strength symmetric 8th - hearing intact 9th - palate elevates symmetrically, uvula midline 11th - shoulder shrug symmetric 12th - tongue protrusion midline  MOTOR:  normal bulk and tone ATROPHY AND WEAKNESS OF LEFT > RIGHT HAND INTRINSIC MUSCLES RUE 4, LUE 5 RLE 4 PROX AND 2 DISTAL LLE 5  SENSORY:  normal and symmetric to light touch, temperature, vibration; DECR IN FINGERS AND FEET  COORDINATION:  finger-nose-finger, fine finger movements normal  REFLEXES:  deep tendon reflexes TRACE and symmetric  GAIT/STATION:  narrow based gait; LIMPING GAIT; RIGHT FOOT / LEG WEAKNESS    DIAGNOSTIC DATA (LABS, IMAGING, TESTING) - I reviewed patient records, labs, notes, testing and imaging myself where available.  Lab Results  Component Value Date   WBC 8.6 03/11/2021   HGB 8.9 (L) 03/11/2021   HCT 27.7 (L) 03/11/2021   MCV 102.2 (H) 03/11/2021   PLT 406 (H) 03/11/2021      Component Value Date/Time   NA 133 (L) 03/11/2021 1429   NA 140 09/03/2017 0933   K 4.0 03/11/2021 1429   CL 94 (L) 03/11/2021 1429   CO2 29 03/11/2021 1429   GLUCOSE 160 (H) 03/11/2021 1429   BUN 22 03/11/2021 1429   BUN 24 09/03/2017 0933   CREATININE 2.78 (H) 03/11/2021 1429   CREATININE 1.66 (H) 06/05/2016 1413   CALCIUM 9.7 03/11/2021 1429   PROT 5.7 (L) 03/11/2021 1429   ALBUMIN 2.6 (L) 03/11/2021 1429   AST 34 03/11/2021 1429   ALT 45 (H) 03/11/2021 1429   ALKPHOS 124  03/11/2021 1429   BILITOT 0.6 03/11/2021 1429   GFRNONAA 23 (L) 03/11/2021 1429   GFRAA 18 (L) 03/07/2020 1046   Lab Results  Component Value Date   CHOL 115 02/02/2021   HDL 46 02/02/2021   LDLCALC 50 02/02/2021   TRIG 94 02/02/2021   CHOLHDL 2.5 02/02/2021   Lab Results  Component Value Date   HGBA1C 6.2 (H) 02/02/2021   Lab Results  Component Value Date   AOZHYQMV78 469 03/07/2021   Lab Results  Component Value Date   TSH 2.330 02/02/2021    10/05/20 MRI brain [I reviewed images myself and agree with interpretation. -VRP]  - No acute finding by MRI. Moderate chronic small-vessel ischemic changes of the cerebral hemispheric white matter. Old small vessel  infarction right cerebellum. Old small right parieto-occipital cortical infarction.  02/28/2021 MRI brain [I reviewed images myself and agree with interpretation. -VRP] 1. New 6 mm acute ischemic nonhemorrhagic infarct involving the posterior left lentiform nucleus. 2. Normal expected interval evolution of recently identified small left cerebral white matter infarct. 3. Underlying age-related cerebral atrophy with moderate chronic microvascular ischemic disease and multiple remote ischemic infarcts as above, stable.    ASSESSMENT AND PLAN  75 y.o. year old male here with numbness and tingling in the hands, lower extremities, gait difficulty, history of right knee replacement, coronary artery disease, chronic kidney disease, atrial fibrillation, dyspnea on exertion.  Patient has signs symptoms consistent with polyneuropathy including atrophy and intrinsic hand muscles.  He also has some gait difficulty related to prior knee surgery and musculoskeletal issues.    Dx:  1. Cerebral thrombosis with cerebral infarction   2. Right arm weakness   3. Right leg weakness      PLAN:  RIGHT SIDED WEAKNESS (right arm and right leg) - due to left brain subcortical stroke - continue eliquis for afib, praluent for lipid mgmt,  CPAP for OSA - ok to stop aspirin (due to excessive bleeding)  RIGHT FOOT DROP (may be due to stroke vs right peroneal neuropathy) - slightly improving; recommend to continue PT exercises; AVOID LEG CROSSING  UPPER AND LOWER EXT WEAKNESS / NUMBNESS (polyneuropathy; borderline diabetes and CKD) - axonal polyneuropathy  Return for return to PCP, pending if symptoms worsen or fail to improve.    Penni Bombard, MD 05/01/9479, 16:55 AM Certified in Neurology, Neurophysiology and Neuroimaging  [facility] [address] [place], [place] [address] [phone]

## 2021-04-03 ENCOUNTER — Other Ambulatory Visit: Payer: Self-pay

## 2021-04-03 ENCOUNTER — Ambulatory Visit: Payer: Medicare Other

## 2021-04-03 DIAGNOSIS — R41841 Cognitive communication deficit: Secondary | ICD-10-CM

## 2021-04-03 NOTE — Therapy (Signed)
[facility] [address] [place], [place], [address] Phone: [phone]   Fax:  [phone]  Speech Language Pathology Treatment  Patient Details  Name: [name] MRN: [id number] Date of Birth: 08-Feb-1946 Referring Provider (SLP): Fenton Foy, NP   Encounter Date: 04/03/2021   End of Session - 04/03/21 1107     Visit Number 2    Number of Visits 17    Date for SLP Re-Evaluation 05/27/21    SLP Start Time 1103    SLP Stop Time  1145    SLP Time Calculation (min) 42 min    Activity Tolerance Patient tolerated treatment well             Past Medical History:  Diagnosis Date   Allergic rhinitis    Anxiety    Atrial fibrillation ([place])    Cancer ([place])    hx of skin cancers    Chronic kidney disease    Coronary artery disease    a. s/p MI & CABG; b. s/p PCI Diag;  c. Cath 12/2011: LAD diff dzs/small, Diag 75% isr, 90 dist to stent (small), LCX & RCA occluded, VG->OM 40, VG->PDA patent - med rx.   COVID-19    x 3   Depression    PTSD   Diabetes mellitus    not on medications   Diastolic CHF, chronic ([place])    a. EF 55-60% by echo 2007   GERD (gastroesophageal reflux disease)    "not anymore" " I had a lap band"   History of diverticulitis of colon    5 YRS AGO   History of pneumonia    2017   History of transient ischemic attack (TIA)    CAROTID DOPPLERS NOV 2011  0-39& BIL. STENOSIS   Hyperlipidemia    Hypertension    Kidney failure    Mitral regurgitation    Myocardial infarction [facility])    Neuromuscular disorder (HCC)    left arm numbness    OA (osteoarthritis)    RIGHT KNEE ARTHOFIBROSIS W/ PAIN  (S/P  REPLACEMENT 2004)   PTSD (post-traumatic stress disorder)    from [place] since 1973   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/25/2016   Complex valvuloplasty including artificial Gore-tex neochord placement x4, plication of anterior commissure, and 26 mm Sorin Memo 3D ring  annuloplasty via right mini thoracotomy approach   Shortness of breath dyspnea    with exertion   Skin cancer    Sleep apnea    cpap- see ov note in EPIC 05/12/11 for settings    Stroke [facility])    hx of   Stroke [facility])     Past Surgical History:  Procedure Laterality Date   AV FISTULA PLACEMENT Left 08/02/2018   Procedure: ARTERIOVENOUS (AV) FISTULA CREATION RADIOCEPHALIC;  Surgeon: Angelia Mould, MD;  Location: [place];  Service: Vascular;  Laterality: Left;   BLEPHAROPLASTY  [place]  2001, 2004, 2009, 2011   CARDIAC CATHETERIZATION N/A 01/23/2016   Procedure: Right/Left Heart Cath and Coronary Angiography;  Surgeon: [name], MD;  Location: [place] CV LAB;  Service: Cardiovascular;  Laterality: N/A;   CARDIOVERSION N/A 09/07/2017   Procedure: CARDIOVERSION;  Surgeon: [name], MD;  Location: [facility] ENDOSCOPY;  Service: Cardiovascular;  Laterality: N/A;   CARDIOVERSION N/A 09/13/2019   Procedure: CARDIOVERSION;  Surgeon: [name], MD;  Location: [place];  Service: Cardiovascular;  Laterality: N/A;  CARDIOVERSION N/A 10/19/2019   Procedure: CARDIOVERSION;  Surgeon: [name], MD;  Location: [facility] ENDOSCOPY;  Service: Cardiovascular;  Laterality: N/A;   CATARACT EXTRACTION W/ INTRAOCULAR LENS IMPLANT Bilateral    CHONDROPLASTY  07/22/2011   Procedure: CHONDROPLASTY;  Surgeon: Dione Plover Aluisio;  Location: [place];  Service: Orthopedics;;   CIRCUMCISION  [place]-- POST CABG   W/ STENT, last cath 01/07/2012    CORONARY ARTERY BYPASS GRAFT  1995   X3 VESSEL   CORONARY ARTERY BYPASS GRAFT  1995   CORONARY STENT PLACEMENT     CYSTOSCOPY/URETEROSCOPY/HOLMIUM LASER/STENT PLACEMENT Right 01/08/2021   Procedure: CYSTOSCOPY/RETROGRADE/URETEROSCOPY/HOLMIUM LASER/STENT PLACEMENT;  Surgeon: Franchot Gallo, MD;  Location: [facility];  Service: Urology;  Laterality:  Right;   IR PERC TUN PERIT CATH WO PORT S&I /IMAG  03/06/2021   KNEE ARTHROSCOPY  07/22/2011   Procedure: ARTHROSCOPY KNEE;  Surgeon: Dione Plover Aluisio;  Location: [place];  Service: Orthopedics;  Laterality: Right;  WITH DEBRIDEMENT    KNEE ARTHROSCOPY W/ MENISCECTOMY  X2 IN 2002-- RIGHT KNEE   LAPAROSCOPIC GASTRIC BANDING  01-15-09   LEFT HEART CATH AND CORONARY ANGIOGRAPHY N/A 04/29/2017   Procedure: LEFT HEART CATH AND CORONARY ANGIOGRAPHY;  Surgeon: [name], MD;  Location: [place] CV LAB;  Service: Cardiovascular;  Laterality: N/A;   LEFT HEART CATHETERIZATION WITH CORONARY ANGIOGRAM N/A 09/11/2013   Procedure: LEFT HEART CATHETERIZATION WITH CORONARY ANGIOGRAM;  Surgeon: [name], MD;  Location: [facility] CATH LAB;  Service: Cardiovascular;  Laterality: N/A;   MITRAL VALVE REPAIR Right 03/25/2016   Procedure: MINIMALLY INVASIVE REOPERATION FOR MITRAL VALVE REPAIR  (MVR) with size 26 Sorin Memo 3D;  Surgeon: [name], MD;  Location: [employer];  Service: Open Heart Surgery;  Laterality: Right;   MITRAL VALVE REPAIR  03/2016   RIGHT KNEE ED COMPARTMENT REPLACEMENT  2004   SYNOVECTOMY  07/22/2011   Procedure: SYNOVECTOMY;  Surgeon: Dione Plover Aluisio;  Location: [place];  Service: Orthopedics;;   TEE WITHOUT CARDIOVERSION N/A 01/23/2016   Procedure: TRANSESOPHAGEAL ECHOCARDIOGRAM (TEE);  Surgeon: [name], MD;  Location: [place];  Service: Cardiovascular;  Laterality: N/A;   TEE WITHOUT CARDIOVERSION N/A 03/25/2016   Procedure: TRANSESOPHAGEAL ECHOCARDIOGRAM (TEE);  Surgeon: [name], MD;  Location: [place];  Service: Open Heart Surgery;  Laterality: N/A;   TEE WITHOUT CARDIOVERSION N/A 03/04/2021   Procedure: TRANSESOPHAGEAL ECHOCARDIOGRAM (TEE);  Surgeon: Lelon Perla, MD;  Location: [facility] ENDOSCOPY;  Service: Cardiovascular;  Laterality: N/A;   UMBILICAL HERNIA REPAIR  01-15-09   W/ GASTRIC BANDING PROCEDURE    There were no  vitals filed for this visit.   Subjective Assessment - 04/03/21 1105     Subjective "tired and weak"    Currently in Pain? No/denies                   ADULT SLP TREATMENT - 04/03/21 1106       General Information   Behavior/Cognition Alert;Cooperative;Pleasant mood      Treatment Provided   Treatment provided Cognitive-Linquistic      Cognitive-Linquistic Treatment   Treatment focused on Cognition;Patient/family/caregiver education    Skilled Treatment Pt reported having reviewed memory compensations provided at evaluation. Pt endorsed using phone calendar and alarms to manage appointments. Pt to bring phone to next ST session to assess current memory system and optimize as needed.  Wife reportedly completing pill organizer and managing bills. Pt reports being more dependent over the last few years since COVID but does not express concern for returning to management these tasks. 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 SLP targeted sequencing of 6 steps to address attention to detail and error awareness. Pt missed 2 steps in verbal presentation. Pt also mislabeled 2 steps, in which pt required mod cues to ID and correct errors. Pt independently self-corrected error x1.      Assessment / Recommendations / Plan   Plan Continue with current plan of care      Progression Toward Goals   Progression toward goals Progressing toward goals              SLP Education - 04/03/21 1153     Education Details functional cognitive tasks, targeted deficits    Person(s) Educated Patient;Spouse    Methods Demonstration;Handout;Explanation    Comprehension Verbalized understanding;Returned demonstration;Need further instruction              SLP Short Term Goals - 04/03/21 1103       SLP SHORT TERM GOAL #1   Title Pt will use memory  compensations for appointments, medicine management, and other daily activities with occasional min A over 2 sessions    Time 4    Period Weeks    Status On-going    Target Date 05/02/21      SLP SHORT TERM GOAL #2   Title Pt will demonstrate selective attention to auditory/written instructions for completion of simple-mod complex task given 2 or less repetitions over 2 sessions    Time 4    Period Weeks    Status On-going    Target Date 05/02/21      SLP SHORT TERM GOAL #3   Title Pt will ID and correct errors on structured cognitive tasks with 80% accuracy given occasional min A over 2 sessions    Time 4    Period Weeks    Status On-going    Target Date 05/02/21      SLP SHORT TERM GOAL #4   Title Pt will complete simple to mod complex executive functioning tasks with 80% accuracy given occasional min A over 2 sessions    Time 4    Period Weeks    Status On-going    Target Date 05/02/21      SLP SHORT TERM GOAL #5   Title Pt/caregiver will complete cognitive PROM in first 1-2 sessions    Time 4    Period Weeks    Status On-going    Target Date 05/02/21              SLP Long Term Goals - 04/03/21 1103       SLP LONG TERM GOAL #1   Title Pt will use memory compensations for appointments, medicine management, and other daily activities with rare min A over 2 sessions    Time 8    Period Weeks    Status On-going    Target Date 05/30/21      SLP LONG TERM GOAL #2   Title Pt will ID and correct errors on structured cognitive tasks with 90% accuracy given rare min A over 2 sessions    Time 8    Period Weeks    Status On-going    Target Date 05/30/21  SLP LONG TERM GOAL #3   Title Pt will complete simple to mod complex executive functioning tasks with 80% accuracy given rare min A over 2 sessions    Time 8    Period Weeks    Status On-going    Target Date 05/30/21      SLP LONG TERM GOAL #4   Title Pt/caregiver will report less frustration and report  improved cognitive functioning via PROM by 2 past at last ST session    Time 8    Period Weeks    Status On-going    Target Date 05/30/21              Plan - 04/03/21 1200     Clinical Impression Statement [place] presents to OPST intervention secondary to memory loss following COVID-19. Reduced awareness of current cognitive deficits indicated, in which SLP reviewed eval results and analysis this session. SLP targeted sequencing task to address error awareness and attention, in which pt required occasional min to mod cues to ID and correct errors. Skilled ST is warranted to address cognitive communication skills to improve functional independence and safety as well as reduce caregiver burden and frustration related to cognitive impairment.    Speech Therapy Frequency 2x / week    Duration 8 weeks    Treatment/Interventions Compensatory strategies;Cueing hierarchy;Functional tasks;Patient/family education;Cognitive reorganization;Compensatory techniques;Internal/external aids;SLP instruction and feedback;Environmental controls    Potential to [place] provided    Consulted and Agree with Plan of Care Patient;Family member/caregiver    Family Member Consulted wife, [name]             Patient will benefit from skilled therapeutic intervention in order to improve the following deficits and impairments:   Cognitive communication deficit    Problem List Patient Active Problem List   Diagnosis Date Noted   Enterococcal bacteremia    History of stroke    Prosthetic valve endocarditis ([place])    Bacteremia due to Gram-positive bacteria 02/27/2021   Anemia 02/26/2021   Weakness generalized 02/26/2021   CKD (chronic kidney disease) stage 4, GFR 15-29 ml/min (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[place]) 02/25/2021   Squamous cell carcinoma of skin of face 02/25/2021   Actinic keratosis 02/25/2021   Anxiety state 02/25/2021   Basal cell carcinoma of nose 02/25/2021   Basal cell carcinoma of skin of unspecified ear and external auricular canal 02/25/2021   Benign prostatic hyperplasia with lower urinary tract symptoms 02/25/2021   Bilateral pseudophakia 02/25/2021   Carcinoma in situ of skin of other parts of face 02/25/2021   Dental caries on smooth surface penetrating into dentin 02/25/2021   Deposits (accretions) on teeth 02/25/2021   Diverticulitis of colon 02/25/2021   Encounter for fitting and adjustment of hearing aid 02/25/2021   Encounter for removal of sutures 02/25/2021   Esophageal reflux 02/25/2021   Generalized osteoarthrosis, involving multiple sites 02/25/2021   Gout, unspecified 02/25/2021   History of other malignant neoplasm of skin 02/25/2021   Male erectile dysfunction, unspecified 02/25/2021   Peptic ulcer 02/25/2021   Post-traumatic stress disorder, unspecified 02/25/2021   Psychosexual dysfunction with inhibited sexual excitement 02/25/2021   Sensorineural hearing loss, bilateral 02/25/2021   Unspecified atrial fibrillation ([place]) 02/25/2021   Protein-calorie malnutrition, severe 02/04/2021   Cerebral thrombosis with cerebral infarction 02/02/2021   Right  sided weakness 02/01/2021   History of COVID-19 01/29/2021   Memory loss 01/29/2021   Olfactory impairment 01/29/2021   Gait disturbance 01/29/2021   Fatigue 01/29/2021   Physical deconditioning 01/29/2021   Pressure injury of skin 01/07/2021   AKI (acute kidney injury) ([place]) 01/06/2021   Generalized weakness 01/06/2021   ARF (acute renal failure) ([place]) 01/06/2021   Dyspnea 10/05/2020   CHF (congestive heart failure) ([place]) 10/04/2020   Carpal tunnel syndrome of right wrist 09/25/2020   Pain of left hand 09/25/2020   COVID-19 virus infection 05/24/2020   Hypertensive chronic kidney disease with  stage 1 through stage 4 chronic kidney disease, or unspecified chronic kidney disease    Coronary artery disease    Type II diabetes mellitus ([place])    Hypertension    Back pain 04/08/2020   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/05/2019   Pain in right foot 03/22/2018   Cellulitis of left foot 03/01/2018   Pain of joint of left ankle and foot 09/06/2017   Cancer ([place])    Other specified counseling 04/03/2016   Pneumothorax on right 03/29/2016   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/25/2016   Atherosclerotic heart disease of native coronary artery without angina pectoris 03/10/2016   Heart failure ([place]) 03/10/2016   Cataract extraction status of eye 03/10/2016   Other long term (current) drug therapy 03/10/2016   Osteoarthritis of knee 03/10/2016   Vitamin D deficiency 03/10/2016   Mitral regurgitation    Avitaminosis D 12/25/2015   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   Arthritis of knee, degenerative 12/25/2015   H/O cataract extraction 12/25/2015   Atherosclerotic heart disease of native coronary artery with other forms of angina pectoris ([place]) 12/25/2015   Congestive heart failure ([place]) 12/25/2015   Pure hypercholesterolemia 12/25/2015   Allergic rhinitis 12/25/2015   Essential (primary) hypertension 12/25/2015   Hypotension 01/29/2014   Expected Blood loss, postoperative 04/29/2012   Pain due to unicompartmental arthroplasty of knee ([place]) 04/27/2012   Preoperative evaluation of a medical condition to rule out surgical contraindications (TAR required) 00/92/3300   Diastolic CHF, chronic ([place]) 06/29/2011   Preoperative evaluation to rule out surgical contraindication 06/29/2011   CAROTID ARTERY STENOSIS 07/09/2010   TIA 05/29/2010   FATIGUE / MALAISE 05/29/2010   CAD, AUTOLOGOUS BYPASS GRAFT 11/15/2008   MYOCARDIAL INFARCTION 12/18/2007   ALLERGIC RHINITIS WITH CONJUNCTIVITIS 12/18/2007   Type 2 diabetes mellitus without complications ([place]) 76/22/6333   Other and unspecified  hyperlipidemia 12/05/2007   Other ill-defined and unknown causes of morbidity and mortality 12/05/2007   Essential hypertension 12/05/2007   Coronary atherosclerosis 12/05/2007   DEGENERATIVE JOINT DISEASE 12/05/2007   Obstructive sleep apnea of adult 12/05/2007    Alinda Deem, MA CCC-SLP 04/03/2021, 2:22 PM  [facility] [address] [place], [place], [address] Phone: [phone]   Fax:  [phone]   Name: AERION BAGDASARIAN MRN: [id number] Date of Birth: August 28, 1945

## 2021-04-03 NOTE — Patient Instructions (Addendum)
We are working on your awareness of errors and attention. Remember to double check your work!  Play card games to work your thinking skills   Please bring your phone to next session so I can see how you are using to help your thinking

## 2021-04-07 NOTE — Progress Notes (Addendum)
Patient ID: [name], male   DOB: December 15, 1945, 75 y.o.   MRN: [id number] PCP: Dr Harrington Challenger Cardiology: Dr. Aundra Dubin  75 y.o. with history of CAD s/p CABG and obesity s/p lap banding procedure presents for cardiology followup.  Given exertional dyspnea and chest tightness, he had LHC in 1/15.  This showed no change from prior.  Grafts were patent and there was severe stenosis in distal D1, not amenable to PCI.   In 2017, he developed increased exertional dyspnea.  Eventually had a TEE showing severe MR with prolapse of a portion of the anterior mitral valve leaflet.  Coronary angiography in 6/17 showed patent grafts and 90% stenosis of distal diagonal not amenable to intervention.  In 8/17, he had mitral valve repair.  Post-op diastolic CHF, Lasix started and increased.   He had a cath again in 9/18 showing stable coronary anatomy.    He developed atrial arrhythmias in 12/18.  Both fibrillation and flutter were seen.  Saw Dr. Rayann Heman, recommended DCCV with amiodarone or sotalol if fibrillation recurs.  He had DCCV in 1/19.   In 6/19, he was noted to have developed AKI. Creatinine went up to 3.46.  Renal US was done, showing normal kidneys. Cause of AKI was not clear.  We stopped his Lasix. He saw nephrology and eventually went back on Lasix, currently taking 80 mg daily.  He is not using any NSAIDs.    He stopped pravastatin due to myalgias. We struggled to get him on a PCSK9 inhibitor through the [place] but he was finally able to get Praluent.    Underwent Lexiscan Cardiolite in 11/19, which showed old inferior MI with no ischemia.  Echo in 11/19 showed EF 55-60%, s/p MV repair with mean gradient 6 mmHg. Echo 12/20 showed EF 50-55%, mildly decreased RV systolic function, repaired mitral valve with mild MR, mean gradient 6 mmHg across MV.    He had DCCV from atrial fibrillation to NSR in 1/21 and again in 2/21.   He reported significant fatigue and dyspnea in setting of weight loss with low BP and HR.   Coreg was discontinued. He felt better with that change.    In 9/21, he had COVID-19 infection but was not hospitalized.   Zio patch in 3/22 showed frequent short atrial tachycardia episodes.  He saw Dr. Rayann Heman, medical therapy recommended (on amiodarone).   He was admitted in 5/22 with AKI from obstruction of right ureter by stone, he was treated by urology.   He was admitted in 6/22 with right-sided weakness and was found to have left cerebral white matter CVA.  He had been compliant with Eliquis, ASA 81 was added.   He was readmitted 7/22 for generalized weakness, falls and intermittent fevers. 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 No evidence of osteomyelitis/discitis on MRI lumbar spine. TEE 03/04/2021 noted oscillating density on a repaired mitral valve likely related to suture but small vegetation cannot be ruled out. ID prescribed 6 weeks of ampicillin plus ceftriaxone and outpatient ID follow-up. IR consulted for PICC for long-term antibiotic therapy, which was placed 7/14.  HH abx arranged and to be followed by ID Service. Also of note, TEE showed evidence of atrial level shunting by color flow doppler w/ large PFO.   Today he returns for HF follow up with his wife. Balance is still poor, but uses a cane for ambulation. Able to get around the house without dyspnea. Denies CP, dizziness, edema, or PND/Orthopnea. Appetite ok. No fever  or chills. Weight at home 172 pounds. Taking all medications. He continues with residual right sided weakness. Doing PT once a week. Occasionally takes an extra lasix.  TEE 7/22  1. S/P MV repair; oscillating density associated with MV annulus likely suture from previous repair; cannot exclude small vegetation; likely needs 6 weeks antibiotics to cover SBE; no MR; elevated mean gradient (12 mmHg). 2. Left ventricular ejection fraction, by estimation, is 60 to 65%. The left ventricle has normal function. The left  ventricle has no regional wall motion abnormalities. 3. Right ventricular systolic function is normal. The right ventricular size is mildly enlarged. 4. Left atrial size was moderately dilated. No left atrial/left atrial appendage thrombus was detected. 5. The mitral valve has been repaired/replaced. No evidence of mitral valve regurgitation. There is a present in the mitral position. Procedure Date: 03/25/2016. 6. The aortic valve is tricuspid. Aortic valve regurgitation is not visualized. 7. There is mild (Grade II) plaque involving the descending aorta. 8. Evidence of atrial level shunting detected by color flow Doppler. There is a large patent foramen ovale.  ECG (personally reviewed): SB LBBB   Labs (11/12): LDL 70 Labs (5/13): K 4.1, creatinine 1.1, BNP 48 Labs (12/13): K 3.4, creatinine 1.07, LDL 59, HDL 37 Labs (1/15): K 4.3, creatinine 1.2, HCT 40.3 Labs (3/15): LDL 38, HDL 81, BNP 55, K 4, creatinine 1.2 Labs (6/15): K 4.2, creatinine 1.4, LFTs normal, LDL 66, HDL 35 Labs (8/16): K 4, creatinine 1.6, BNP 64 Labs (12/16): LDL 61, HDL 37 Labs (4/17): K 4.2, creatinine 1.35, HCT 38.7, BNP 80 Labs (8/17): K 4.2, creatinine 1.4 => 1.66, BNP 159 Labs (12/17): K 3.6, creatinine 1.75 Labs (2/18): LDL 76, HDL 36 Labs (9/18): K 3.9, creatinine 1.76 Labs (1/19): K 4.9, creatinine 1.76, hgb 14 Labs (6/19): K 3.6, creatinine 3.46 Labs (7/19): K 4.1, creatinine 3.66, hgb 12.7 Labs (10/19): LDL 177, hgb 13.4, troponin negative, K 4, creatinine 4.15 Labs (12/19): K 4.3, creatinine 3.88 Labs (4/20): K 3.6, creatinine 3.39 Labs (11/20): LDL 73, HDL 38 Labs (2/21): K 3.4, creatinine 3.8 Labs (7/21): K 3.7, creatinine 3.66 Labs (6/22): K 4, creatinine 2.88, LDL 50 Labs (7/22): K 4.0, creatinine 2.78   PMH: 1. OSA: on CPAP.  2. Diabetes mellitus 3. Allergic rhinitis 4. CAD: s/p CABG 1995.  LHC (10/11) with 90% distal D1 (patent proximal D1 stent), total occlusion CFX, total occlusion  RCA, no significant stenoses in LAD, SVG-PDA patent, SVG-OM patent, EF 55%.  Medical management.  LHC (5/13) with patent grafts and 90% distal D1 stenosis (no significant change from 10/11).  LHC (1/15) with patent grafts, 75% ISR in D1 stent, 90% stenosis distal D1 (small caliber). D1 not amenable to intervention.  - Coronary angiography (6/17): grafts patent, 90% stenosis distally in large diagonal not amenable to intervention.   - LHC (9/18): SVG-OM and SVG-PDA patent,  75% in-stent restenosis in proximal diagonal then 90% stenosis distally (small in caliber at that point), totally occluded LCx and RCA.  Left coronary system similar to prior caths, D1 may be source of angina.  - Lexiscan Cardiolite (11/19): EF 47%, old inferior MI, no ischemia.  5. Obesity: lap-banding in 5/11.  6. Chronic diastolic CHF: Echo (2/84) with EF 55-60%, moderate LVH, moderate diastolic dysfunction, s/p mitral valve repair with elevated mean gradient but normal pressure half-time.  - Echo (8/18): EF 55-60%, s/p MV repair with mean gradient 6 mmHg, PASP 28 mmHg, mildly decreased RV systolic function.  - Echo (11/19):  EF 55-60%, s/p MV repair with mean gradient 6, PASP 42 mmHg, mildly dilated RV, dilated IVC.  - Echo (12/20): EF 50-55%, mildly decreased RV systolic function. S/p MV repair with mean gradient 6 mmHg, mild MR.  - Echo (2/22): EF 45-50%, mild LVH, moderate RV enlargement with low normal systolic function, s/p mitral valve repair with mean gradient 5 mmHg and trivial MR, mild-moderate AS.  7. TIA: Carotid dopplers in 11/11 with 0-39% bilateral stenosis.  Carotid dopplers 6/17 with 1-39% RICA stenosis.  8. OA: Knees, C-spine. TKR 9/13.  9. HTN 10. Hyperlipidemia: Myalgias with atorvastatin and Crestor. Now on Praluent.  11. Diverticulosis 12. MItral regurgitation: TEE (6/17) with EF 55-60%, severe eccentric MR, prolapse of a portion of the anterior leaflet, peak RV-RA gradient 45 mmHg.  - Mitral valve repair  8/17 13. CKD: Stage III.  14. Atrial fibrillation/atrial flutter: Both arrhythmias noted in 12/18.  DCCV 12/19.   - DCCV to NSR 1/21.  - DCCV to NSR 2/21.  15. AKI 6/19 => CKD stage 4: uncertain etiology.  Renal US was normal.  16. Gout 17. COVID-19 infection 9/21 18. 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  - Carotid dopplers (6/22) with mild disease only.  19. Nephrolithiasis.  20. Atrial tachycardia: Noted on 3/22 Zio patch.  21. Aortic stenosis: mild-moderate on 2/22 echo.   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  Lives in [place].   Family History  Problem Relation Age of Onset   Other Mother        joint problems   Alzheimer's disease Mother    Hypertension Father    Heart disease Father    CVA Father        age 53   Depression Father    Heart disease Sister        CABG   Stroke Sister    Heart failure Sister        congestive   Diabetes Sister    Heart disease Brother    Hypertension Brother    Congestive Heart Failure Brother    ROS: All systems reviewed and negative except as per HPI.   Current Outpatient Medications  Medication Sig Dispense Refill   acetaminophen (TYLENOL) 325 MG tablet Take 2 tablets (650 mg total) by mouth every 6 (six) hours as needed for mild pain (or Fever >/= 101).     Alirocumab (PRALUENT) 75 MG/ML SOAJ Inject 75 mg into the skin every 14 (fourteen) days.     allopurinol (ZYLOPRIM) 100 MG tablet Take 0.5 tablets (50 mg total) by mouth 2 (two) times a week. Please discuss dosing with your PCP based on your renal function. 4 tablet 0   amiodarone (PACERONE) 200 MG tablet Take 1 tablet (200 mg total) by mouth daily. 60 tablet 0   ampicillin IVPB Inject 2 g into the vein every 8 (eight) hours. Indication:  Enterococcal endocarditis  First Dose: Yes Last Day of Therapy:  04/11/21  Labs - Once weekly:  CBC/D and BMP, Labs - Every other week:  ESR and CRP Method of administration:  Ambulatory Pump (Continuous Infusion) Method of administration may be changed at the discretion of home infusion pharmacist based upon assessment of the patient and/or caregiver's ability to self-administer the medication ordered. 114 Units 0   apixaban (ELIQUIS) 5 MG TABS tablet Take 1 tablet (5 mg total) by mouth 2 (two) times daily. 180 tablet 3   Ascorbic Acid (VITAMIN C WITH ROSE  HIPS) 1000 MG tablet Take 1,000 mg by mouth daily.     calcitRIOL (ROCALTROL) 0.25 MCG capsule Take 0.25 mcg by mouth every other day.     Carboxymethylcellulose Sodium 1 % GEL APPLY 1 DROP TO EACH EYE AT BEDTIME     cefTRIAXone (ROCEPHIN) IVPB Inject 2 g into the vein every 12 (twelve) hours. Indication:  Enterococcal endocarditis First Dose: Yes Last Day of Therapy:  04/11/21 Labs - Once weekly:  CBC/D and BMP, Labs - Every other week:  ESR and CRP Method of administration: IV Push Method of administration may be changed at the discretion of home infusion pharmacist based upon assessment of the patient and/or caregiver's ability to self-administer the medication ordered. 76 Units 0   cetirizine (ZYRTEC) 10 MG tablet Take 10 mg by mouth daily.     colchicine 0.6 MG tablet Take 0.6-1.2 mg by mouth 2 (two) times daily as needed (gout).      fluticasone (FLONASE) 50 MCG/ACT nasal spray Place 1 spray into both nostrils daily as needed for allergies or rhinitis.     furosemide (LASIX) 40 MG tablet Take 40 mg by mouth daily.     HYDROcodone-acetaminophen (NORCO/VICODIN) 5-325 MG tablet Take 1-2 tablets by mouth every 4 (four) hours as needed for severe pain. 30 tablet 0   nitroGLYCERIN (NITROSTAT) 0.4 MG SL tablet 1 TAB UNDER THE TONGUE EVERY 5 MINUTES AS NEEDED FOR CHEST PAIN UP TO 3 DOSES, IF PERSIST CALL 911 75 tablet 1   ONETOUCH VERIO test strip 1 each daily.     pantoprazole (PROTONIX) 40 MG tablet Take 40 mg by mouth daily as needed (heartburn).     potassium chloride SA (KLOR-CON) 20 MEQ tablet Take 20 mEq by  mouth daily.     Semaglutide,0.25 or 0.5MG/DOS, (OZEMPIC, 0.25 OR 0.5 MG/DOSE,) 2 MG/1.5ML SOPN Inject 0.375 mLs (0.5 mg total) into the skin every Thursday. 3 pen 3   tadalafil (CIALIS) 20 MG tablet TAKE 1 TABLET BY MOUTH ONCE DAILY AS NEEDED 10 tablet 0   traZODone (DESYREL) 100 MG tablet Take 100 mg by mouth at bedtime.     No current facility-administered medications for this encounter.   BP 120/64   Pulse (!) 56   Wt 80.7 kg (178 lb)   SpO2 100%   BMI 25.18 kg/m    Wt Readings from Last 3 Encounters:  04/08/21 80.7 kg (178 lb)  04/01/21 79.4 kg (175 lb)  03/28/21 78 kg (172 lb)   PHYSICAL EXAM: General:  NAD. No resp difficulty, elderly, walked into clinic with cane HEENT: Normal Neck: Supple. No JVD. Carotids 2+ bilat; no bruits. No lymphadenopathy or thryomegaly appreciated. Cor: PMI nondisplaced. Regular rate & rhythm. No rubs, gallops, II/VI SEM RUSB. Lungs: Clear, +tunneled PICC RU chest. Abdomen: Soft, nontender, nondistended. No hepatosplenomegaly. No bruits or masses. Good bowel sounds. Extremities: No cyanosis, clubbing, rash, trace LE edema Neuro: Alert & oriented x 3, cranial nerves grossly intact. Moves all 4 extremities w/o difficulty. Affect pleasant.  Assessment/Plan: 1. S/p mitral valve repair:  Echo 2/22 showed stable mitral valve repair compared to prior echo with trivial MR, mean gradient 5 mmHg.  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 No evidence of osteomyelitis/discitis on MRI lumbar spine -TEE 03/04/2021 noted oscillating density on a repaired mitral valve likely related to suture but small vegetation cannot be ruled out. No MR.  - ID prescribed 6 weeks of ampicillin plus ceftriaxone. - c/w abx x 6 weeks  per ID.  - With prosthetic material, should use antibiotic prophylaxis with dental work.  2. CAD:  He has severe stenosis in a distal D1 that is not amenable to intervention.  This has been stable on last couple  of caths. He has occluded RCA and LCx known from the past with SVGs to OM and PDA.  Echo in 2/22 with EF 45-50%. Cardiolite in 11/19 showed no ischemia. No chest pain.  - He is not off ASA (excessive bleeding). - He cannot tolerate statins with myalgias, Now on Praluent.    3. Hyperlipidemia: Myalgias with Crestor, pravastatin, and atorvastatin.  We were finally able to get him Praluent through the [place]. LDL 50 in 6/22.  4. Chronic primarily diastolic CHF:  Echo (2/63) with EF 45-50%, mild LVH, moderate RV enlargement with low normal systolic function, s/p mitral valve repair with mean gradient 5 mmHg and trivial MR, mild-moderate AS. TEE 7/22 EF 60-65%. Chronic NYHA Class II-III. He is not volume overloaded today. - Continue lasix 40 mg daily.  - Continue TED hoses.  5. OSA: Continue CPAP.  6. Atrial fibrillation: Paroxysmal, in NSR after DCCV in 2/21.  He is now on amiodarone to maintain NSR given rapid recurrence of AF after DCCV in 1/21.  Probably not a good ablation candidate with size of atria.   - Continue apixaban 5 mg bid.  - With HR 54, will decrease amiodarone to 100 mg daily. Check LFTs, TSH today.  He will need a regular eye exam.  7. CKD stage 4: AKI without recovery, uncertain etiology.  He sees nephrology, there is concern that he is progressing towards HD. He now has AV fistula. Recent creatinine 2.78. - BMET today.   8. Diabetes: He is now followed by endocrinology. 9. Atrial tachycardia: Paroxysmal.  Seen by Dr. Rayann Heman, medical management planned.   - Amiodarone as above.  10. CVA: Atrial fibrillation-related.  Carotid dopplers 6/22 with mild stenosis only.  - He has been on Eliquis.  ASA stopped by neurology (due to excessive bleeding). - Recent TEE 7/22 showed evidence of atrial level shunting detected by color flow Doppler. There is a large patent foramen ovale. D/w neurology no further closure device recommended from a stroke standpoint and unlikely recent CVA was from PFO  given advanced age and atrial fibrillation on Eliquis. 11. Aortic stenosis: Mild-moderate on 2/22 echo.  12. Enterococcus faecalis bacteremia ?Prosthetic valve endocarditis: Recent admit 7/22. No evidence of osteomyelitis/discitis on MRI lumbar spine. TEE 03/04/2021 noted oscillating density on a repaired mitral valve likely related to suture but small vegetation cannot be ruled out. No MR. ID prescribed 6 weeks of ampicillin plus ceftriaxone - c/w abx per ID. He finishes his abx this week. 13. PFO: noted on recent TEE 7/22. Had recent CVA 6/22. Per Dr. Erlinda Hong with Neurology, stroke was most likely small vessel disease in origin. Also with chronic afib on Eliquis and advanced age, stroke likely not related to PFO.  - No closure device needed from stroke standpoint. 14. Chronic Anemia: Most recent hgb was 8.9, TIBC 181 - Will arrange for iron infusion soon. He is finished with his IV abx this Friday 8/19 and will have his port removed Monday 8/22.  Follow up with Dr. Aundra Dubin in 6-8 weeks.  [place], [place] 04/08/2021

## 2021-04-08 ENCOUNTER — Other Ambulatory Visit: Payer: Self-pay

## 2021-04-08 ENCOUNTER — Encounter (HOSPITAL_COMMUNITY): Payer: Self-pay

## 2021-04-08 ENCOUNTER — Ambulatory Visit (HOSPITAL_COMMUNITY)
Admission: RE | Admit: 2021-04-08 | Discharge: 2021-04-08 | Disposition: A | Discharge status: Home / Self Care | Payer: Medicare Other | Source: Ambulatory Visit | Attending: Family Medicine | Admitting: Family Medicine

## 2021-04-08 VITALS — BP 120/64 | HR 56 | Wt 178.0 lb

## 2021-04-08 DIAGNOSIS — Z955 Presence of coronary angioplasty implant and graft: Secondary | ICD-10-CM | POA: Insufficient documentation

## 2021-04-08 DIAGNOSIS — E1122 Type 2 diabetes mellitus with diabetic chronic kidney disease: Secondary | ICD-10-CM

## 2021-04-08 DIAGNOSIS — Z8616 Personal history of COVID-19: Secondary | ICD-10-CM | POA: Insufficient documentation

## 2021-04-08 DIAGNOSIS — I2510 Atherosclerotic heart disease of native coronary artery without angina pectoris: Secondary | ICD-10-CM | POA: Diagnosis not present

## 2021-04-08 DIAGNOSIS — Z8673 Personal history of transient ischemic attack (TIA), and cerebral infarction without residual deficits: Secondary | ICD-10-CM | POA: Insufficient documentation

## 2021-04-08 DIAGNOSIS — I350 Nonrheumatic aortic (valve) stenosis: Secondary | ICD-10-CM

## 2021-04-08 DIAGNOSIS — Z9989 Dependence on other enabling machines and devices: Secondary | ICD-10-CM

## 2021-04-08 DIAGNOSIS — I509 Heart failure, unspecified: Secondary | ICD-10-CM | POA: Diagnosis not present

## 2021-04-08 DIAGNOSIS — Z9889 Other specified postprocedural states: Secondary | ICD-10-CM

## 2021-04-08 DIAGNOSIS — B952 Enterococcus as the cause of diseases classified elsewhere: Secondary | ICD-10-CM | POA: Diagnosis not present

## 2021-04-08 DIAGNOSIS — Z833 Family history of diabetes mellitus: Secondary | ICD-10-CM | POA: Insufficient documentation

## 2021-04-08 DIAGNOSIS — N184 Chronic kidney disease, stage 4 (severe): Secondary | ICD-10-CM | POA: Diagnosis not present

## 2021-04-08 DIAGNOSIS — Z79899 Other long term (current) drug therapy: Secondary | ICD-10-CM | POA: Insufficient documentation

## 2021-04-08 DIAGNOSIS — I471 Supraventricular tachycardia: Secondary | ICD-10-CM | POA: Insufficient documentation

## 2021-04-08 DIAGNOSIS — T826XXA Infection and inflammatory reaction due to cardiac valve prosthesis, initial encounter: Secondary | ICD-10-CM

## 2021-04-08 DIAGNOSIS — Z951 Presence of aortocoronary bypass graft: Secondary | ICD-10-CM | POA: Diagnosis not present

## 2021-04-08 DIAGNOSIS — I482 Chronic atrial fibrillation, unspecified: Secondary | ICD-10-CM | POA: Diagnosis not present

## 2021-04-08 DIAGNOSIS — I38 Endocarditis, valve unspecified: Secondary | ICD-10-CM

## 2021-04-08 DIAGNOSIS — G4733 Obstructive sleep apnea (adult) (pediatric): Secondary | ICD-10-CM | POA: Diagnosis not present

## 2021-04-08 DIAGNOSIS — Z794 Long term (current) use of insulin: Secondary | ICD-10-CM

## 2021-04-08 DIAGNOSIS — I480 Paroxysmal atrial fibrillation: Secondary | ICD-10-CM

## 2021-04-08 DIAGNOSIS — I5032 Chronic diastolic (congestive) heart failure: Secondary | ICD-10-CM | POA: Diagnosis not present

## 2021-04-08 DIAGNOSIS — E785 Hyperlipidemia, unspecified: Secondary | ICD-10-CM | POA: Insufficient documentation

## 2021-04-08 DIAGNOSIS — I639 Cerebral infarction, unspecified: Secondary | ICD-10-CM | POA: Diagnosis not present

## 2021-04-08 DIAGNOSIS — I770 Arteriovenous fistula, acquired: Secondary | ICD-10-CM | POA: Diagnosis not present

## 2021-04-08 DIAGNOSIS — Q211 Atrial septal defect: Secondary | ICD-10-CM | POA: Insufficient documentation

## 2021-04-08 DIAGNOSIS — I080 Rheumatic disorders of both mitral and aortic valves: Secondary | ICD-10-CM | POA: Diagnosis not present

## 2021-04-08 DIAGNOSIS — I130 Hypertensive heart and chronic kidney disease with heart failure and stage 1 through stage 4 chronic kidney disease, or unspecified chronic kidney disease: Secondary | ICD-10-CM | POA: Insufficient documentation

## 2021-04-08 DIAGNOSIS — Q2112 Patent foramen ovale: Secondary | ICD-10-CM

## 2021-04-08 DIAGNOSIS — N179 Acute kidney failure, unspecified: Secondary | ICD-10-CM | POA: Insufficient documentation

## 2021-04-08 DIAGNOSIS — Z7901 Long term (current) use of anticoagulants: Secondary | ICD-10-CM | POA: Insufficient documentation

## 2021-04-08 DIAGNOSIS — Z8249 Family history of ischemic heart disease and other diseases of the circulatory system: Secondary | ICD-10-CM | POA: Diagnosis not present

## 2021-04-08 DIAGNOSIS — D509 Iron deficiency anemia, unspecified: Secondary | ICD-10-CM

## 2021-04-08 DIAGNOSIS — D649 Anemia, unspecified: Secondary | ICD-10-CM | POA: Insufficient documentation

## 2021-04-08 DIAGNOSIS — Z791 Long term (current) use of non-steroidal anti-inflammatories (NSAID): Secondary | ICD-10-CM | POA: Diagnosis not present

## 2021-04-08 LAB — COMPREHENSIVE METABOLIC PANEL
ALT: 14 U/L (ref 0–44)
AST: 18 U/L (ref 15–41)
Albumin: 3.2 g/dL — ABNORMAL LOW (ref 3.5–5.0)
Alkaline Phosphatase: 84 U/L (ref 38–126)
Anion gap: 10 (ref 5–15)
BUN: 25 mg/dL — ABNORMAL HIGH (ref 8–23)
CO2: 29 mmol/L (ref 22–32)
Calcium: 9.8 mg/dL (ref 8.9–10.3)
Chloride: 100 mmol/L (ref 98–111)
Creatinine, Ser: 2.79 mg/dL — ABNORMAL HIGH (ref 0.61–1.24)
GFR, Estimated: 23 mL/min — ABNORMAL LOW (ref >60)
Glucose, Bld: 148 mg/dL — ABNORMAL HIGH (ref 70–99)
Potassium: 3.6 mmol/L (ref 3.5–5.1)
Sodium: 139 mmol/L (ref 135–145)
Total Bilirubin: 0.8 mg/dL (ref 0.3–1.2)
Total Protein: 6.4 g/dL — ABNORMAL LOW (ref 6.5–8.1)

## 2021-04-08 LAB — TSH: TSH: 2.562 u[IU]/mL (ref 0.350–4.500)

## 2021-04-08 LAB — CBC
HCT: 31.1 % — ABNORMAL LOW (ref 39.0–52.0)
Hemoglobin: 10.1 g/dL — ABNORMAL LOW (ref 13.0–17.0)
MCH: 33.3 pg (ref 26.0–34.0)
MCHC: 32.5 g/dL (ref 30.0–36.0)
MCV: 102.6 fL — ABNORMAL HIGH (ref 80.0–100.0)
Platelets: 182 10*3/uL (ref 150–400)
RBC: 3.03 MIL/uL — ABNORMAL LOW (ref 4.22–5.81)
RDW: 14.3 % (ref 11.5–15.5)
WBC: 6.6 10*3/uL (ref 4.0–10.5)
nRBC: 0 % (ref 0.0–0.2)

## 2021-04-08 MED ORDER — AMIODARONE HCL 200 MG PO TABS: 100.0000 mg | ORAL_TABLET | Freq: Every day | ORAL | 0 refills | Status: DC

## 2021-04-08 NOTE — Patient Instructions (Addendum)
Lab work was done today, if any labs are abnormal the clinic will call you  EKG was performed today  DECREASE Amiodarone to 100 mg (1/2 tablet) daily  Your physician recommends that you schedule a follow-up appointment in: 2 months  milAt the [facility], you and your health needs are our priority. As part of our continuing mission to provide you with exceptional heart care, we have created designated Provider Care Teams. These Care Teams include your primary Cardiologist (physician) and Advanced Practice Providers (APPs- Physician Assistants and Nurse Practitioners) who all work together to provide you with the care you need, when you need it.   You may see any of the following providers on your designated Care Team at your next follow up: Dr Glori Bickers Dr [name] Dr Patrice Paradise, NP Lyda Jester, [place] Ginnie Smart Audry Riles, PharmD   Please be sure to bring in all your medications bottles to every appointment.    If you have any questions or concerns before your next appointment please send [place] a message through [place] or call our office at [phone].    TO LEAVE A MESSAGE FOR THE NURSE SELECT OPTION 2, PLEASE LEAVE A MESSAGE INCLUDING: YOUR NAME DATE OF BIRTH CALL BACK NUMBER REASON FOR CALL**this is important as we prioritize the call backs  YOU WILL RECEIVE A CALL BACK THE SAME DAY AS LONG AS YOU CALL BEFORE 4:00 PM

## 2021-04-09 ENCOUNTER — Other Ambulatory Visit (HOSPITAL_COMMUNITY): Payer: Self-pay | Admitting: *Deleted

## 2021-04-09 ENCOUNTER — Telehealth (HOSPITAL_COMMUNITY): Payer: Self-pay | Admitting: *Deleted

## 2021-04-09 DIAGNOSIS — D509 Iron deficiency anemia, unspecified: Secondary | ICD-10-CM

## 2021-04-09 DIAGNOSIS — I509 Heart failure, unspecified: Secondary | ICD-10-CM

## 2021-04-09 NOTE — Telephone Encounter (Signed)
Ferriheme infusion scheduled for 04/17/21 at 10 am, pt's wife is aware, orders placed per Allena Katz, NP

## 2021-04-11 ENCOUNTER — Ambulatory Visit (HOSPITAL_COMMUNITY)
Admission: RE | Admit: 2021-04-11 | Discharge: 2021-04-11 | Disposition: A | Discharge status: Home / Self Care | Payer: Medicare Other | Source: Ambulatory Visit | Attending: Internal Medicine | Admitting: Internal Medicine

## 2021-04-11 ENCOUNTER — Other Ambulatory Visit: Payer: Self-pay

## 2021-04-11 DIAGNOSIS — I252 Old myocardial infarction: Secondary | ICD-10-CM | POA: Insufficient documentation

## 2021-04-11 DIAGNOSIS — E785 Hyperlipidemia, unspecified: Secondary | ICD-10-CM | POA: Insufficient documentation

## 2021-04-11 DIAGNOSIS — I119 Hypertensive heart disease without heart failure: Secondary | ICD-10-CM | POA: Insufficient documentation

## 2021-04-11 DIAGNOSIS — I330 Acute and subacute infective endocarditis: Secondary | ICD-10-CM | POA: Diagnosis not present

## 2021-04-11 DIAGNOSIS — I052 Rheumatic mitral stenosis with insufficiency: Secondary | ICD-10-CM | POA: Insufficient documentation

## 2021-04-11 DIAGNOSIS — E119 Type 2 diabetes mellitus without complications: Secondary | ICD-10-CM | POA: Diagnosis not present

## 2021-04-11 DIAGNOSIS — G4730 Sleep apnea, unspecified: Secondary | ICD-10-CM | POA: Diagnosis not present

## 2021-04-11 DIAGNOSIS — N185 Chronic kidney disease, stage 5: Secondary | ICD-10-CM | POA: Diagnosis not present

## 2021-04-11 LAB — ECHOCARDIOGRAM COMPLETE
Area-P 1/2: 2 cm2
Calc EF: 60.6 %
MV VTI: 1.44 cm2
S' Lateral: 3.7 cm
Single Plane A2C EF: 53.2 %
Single Plane A4C EF: 65.7 %

## 2021-04-11 NOTE — Progress Notes (Signed)
  Echocardiogram 2D Echocardiogram has been performed.  [name] 04/11/2021, 12:06 PM

## 2021-04-14 ENCOUNTER — Ambulatory Visit: Payer: Medicare Other

## 2021-04-14 ENCOUNTER — Other Ambulatory Visit: Payer: Self-pay

## 2021-04-14 ENCOUNTER — Ambulatory Visit (HOSPITAL_COMMUNITY)
Admission: RE | Admit: 2021-04-14 | Discharge: 2021-04-14 | Disposition: A | Discharge status: Home / Self Care | Payer: Medicare Other | Source: Ambulatory Visit | Attending: Internal Medicine | Admitting: Internal Medicine

## 2021-04-14 DIAGNOSIS — I330 Acute and subacute infective endocarditis: Secondary | ICD-10-CM

## 2021-04-14 DIAGNOSIS — Z452 Encounter for adjustment and management of vascular access device: Secondary | ICD-10-CM | POA: Insufficient documentation

## 2021-04-14 DIAGNOSIS — R41841 Cognitive communication deficit: Secondary | ICD-10-CM

## 2021-04-14 HISTORY — PX: IR REMOVAL TUN CV CATH W/O FL: IMG2289

## 2021-04-14 MED ORDER — CHLORHEXIDINE GLUCONATE 4 % EX LIQD
CUTANEOUS | Status: AC
  Filled 2021-04-14: qty 15

## 2021-04-14 NOTE — Therapy (Signed)
[facility] [address] [place], [place], [address] Phone: [phone]   Fax:  [phone]  Speech Language Pathology Treatment  Patient Details  Name: [name] MRN: [id number] Date of Birth: [birth year] Referring Provider (SLP): Fenton Foy, NP   Encounter Date: 04/14/2021   End of Session - 04/14/21 1000     Visit Number 3    Number of Visits 17    Date for SLP Re-Evaluation 05/27/21    SLP Start Time 1000    SLP Stop Time  8841    SLP Time Calculation (min) 43 min    Activity Tolerance Patient tolerated treatment well             Past Medical History:  Diagnosis Date   Allergic rhinitis    Anxiety    Atrial fibrillation ([place])    Cancer ([place])    hx of skin cancers    Chronic kidney disease    Coronary artery disease    a. s/p MI & CABG; b. s/p PCI Diag;  c. Cath 12/2011: LAD diff dzs/small, Diag 75% isr, 90 dist to stent (small), LCX & RCA occluded, VG->OM 40, VG->PDA patent - med rx.   COVID-19    x 3   Depression    PTSD   Diabetes mellitus    not on medications   Diastolic CHF, chronic ([place])    a. EF 55-60% by echo 2007   GERD (gastroesophageal reflux disease)    "not anymore" " I had a lap band"   History of diverticulitis of colon    5 YRS AGO   History of pneumonia    2017   History of transient ischemic attack (TIA)    CAROTID DOPPLERS NOV 2011  0-39& BIL. STENOSIS   Hyperlipidemia    Hypertension    Kidney failure    Mitral regurgitation    Myocardial infarction [facility])    Neuromuscular disorder (HCC)    left arm numbness    OA (osteoarthritis)    RIGHT KNEE ARTHOFIBROSIS W/ PAIN  (S/P  REPLACEMENT 2004)   PTSD (post-traumatic stress disorder)    from [place] since 1973   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/25/2016   Complex valvuloplasty including artificial Gore-tex neochord placement x4, plication of anterior commissure, and 26 mm Sorin Memo 3D ring  annuloplasty via right mini thoracotomy approach   Shortness of breath dyspnea    with exertion   Skin cancer    Sleep apnea    cpap- see ov note in EPIC 05/12/11 for settings    Stroke [facility])    hx of   Stroke [facility])     Past Surgical History:  Procedure Laterality Date   AV FISTULA PLACEMENT Left 08/02/2018   Procedure: ARTERIOVENOUS (AV) FISTULA CREATION RADIOCEPHALIC;  Surgeon: Angelia Mould, MD;  Location: [place];  Service: Vascular;  Laterality: Left;   BLEPHAROPLASTY  [place]  2001, 2004, 2009, 2011   CARDIAC CATHETERIZATION N/A 01/23/2016   Procedure: Right/Left Heart Cath and Coronary Angiography;  Surgeon: [name], MD;  Location: [place] CV LAB;  Service: Cardiovascular;  Laterality: N/A;   CARDIOVERSION N/A 09/07/2017   Procedure: CARDIOVERSION;  Surgeon: [name], MD;  Location: [facility] ENDOSCOPY;  Service: Cardiovascular;  Laterality: N/A;   CARDIOVERSION N/A 09/13/2019   Procedure: CARDIOVERSION;  Surgeon: [name], MD;  Location: [place];  Service: Cardiovascular;  Laterality: N/A;  CARDIOVERSION N/A 10/19/2019   Procedure: CARDIOVERSION;  Surgeon: [name], MD;  Location: [facility] ENDOSCOPY;  Service: Cardiovascular;  Laterality: N/A;   CATARACT EXTRACTION W/ INTRAOCULAR LENS IMPLANT Bilateral    CHONDROPLASTY  07/22/2011   Procedure: CHONDROPLASTY;  Surgeon: Dione Plover Aluisio;  Location: [place];  Service: Orthopedics;;   CIRCUMCISION  [place]-- POST CABG   W/ STENT, last cath 01/07/2012    CORONARY ARTERY BYPASS GRAFT  1995   X3 VESSEL   CORONARY ARTERY BYPASS GRAFT  1995   CORONARY STENT PLACEMENT     CYSTOSCOPY/URETEROSCOPY/HOLMIUM LASER/STENT PLACEMENT Right 01/08/2021   Procedure: CYSTOSCOPY/RETROGRADE/URETEROSCOPY/HOLMIUM LASER/STENT PLACEMENT;  Surgeon: Franchot Gallo, MD;  Location: [facility];  Service: Urology;  Laterality:  Right;   IR PERC TUN PERIT CATH WO PORT S&I /IMAG  03/06/2021   KNEE ARTHROSCOPY  07/22/2011   Procedure: ARTHROSCOPY KNEE;  Surgeon: Dione Plover Aluisio;  Location: [place];  Service: Orthopedics;  Laterality: Right;  WITH DEBRIDEMENT    KNEE ARTHROSCOPY W/ MENISCECTOMY  X2 IN 2002-- RIGHT KNEE   LAPAROSCOPIC GASTRIC BANDING  01-15-09   LEFT HEART CATH AND CORONARY ANGIOGRAPHY N/A 04/29/2017   Procedure: LEFT HEART CATH AND CORONARY ANGIOGRAPHY;  Surgeon: [name], MD;  Location: [place] CV LAB;  Service: Cardiovascular;  Laterality: N/A;   LEFT HEART CATHETERIZATION WITH CORONARY ANGIOGRAM N/A 09/11/2013   Procedure: LEFT HEART CATHETERIZATION WITH CORONARY ANGIOGRAM;  Surgeon: [name], MD;  Location: [facility] CATH LAB;  Service: Cardiovascular;  Laterality: N/A;   MITRAL VALVE REPAIR Right 03/25/2016   Procedure: MINIMALLY INVASIVE REOPERATION FOR MITRAL VALVE REPAIR  (MVR) with size 26 Sorin Memo 3D;  Surgeon: [name], MD;  Location: [place];  Service: Open Heart Surgery;  Laterality: Right;   MITRAL VALVE REPAIR  03/2016   RIGHT KNEE ED COMPARTMENT REPLACEMENT  2004   SYNOVECTOMY  07/22/2011   Procedure: SYNOVECTOMY;  Surgeon: Dione Plover Aluisio;  Location: [place];  Service: Orthopedics;;   TEE WITHOUT CARDIOVERSION N/A 01/23/2016   Procedure: TRANSESOPHAGEAL ECHOCARDIOGRAM (TEE);  Surgeon: [name], MD;  Location: [place];  Service: Cardiovascular;  Laterality: N/A;   TEE WITHOUT CARDIOVERSION N/A 03/25/2016   Procedure: TRANSESOPHAGEAL ECHOCARDIOGRAM (TEE);  Surgeon: [name], MD;  Location: [place];  Service: Open Heart Surgery;  Laterality: N/A;   TEE WITHOUT CARDIOVERSION N/A 03/04/2021   Procedure: TRANSESOPHAGEAL ECHOCARDIOGRAM (TEE);  Surgeon: Lelon Perla, MD;  Location: [facility] ENDOSCOPY;  Service: Cardiovascular;  Laterality: N/A;   UMBILICAL HERNIA REPAIR  01-15-09   W/ GASTRIC BANDING PROCEDURE    There were no  vitals filed for this visit.   Subjective Assessment - 04/14/21 1000     Subjective "I misplaced my cane"    Currently in Pain? Yes    Pain Score 5     Pain Location Back                   ADULT SLP TREATMENT - 04/14/21 1000       General Information   Behavior/Cognition Alert;Cooperative;Pleasant mood      Treatment Provided   Treatment provided Cognitive-Linquistic      Cognitive-Linquistic Treatment   Treatment focused on Cognition;Patient/family/caregiver education    Skilled Treatment Pt recalled upcoming appointment today. Pt endorsed misplacing cane over weekend and needing to acquire new one. Pt reports having completed repairs  at [employer], which reportedly went fine. SLP targeted attention to detail instruction tasks (2 directions). SLP cued patient to double check work. Some inconsistent immediate awarenesss of errors exhibited initially, which improved with SLP cued double check and slow rate. Accuracy improved from 80% accuracy to 100% accuracy as tasks progressed with benefit of slow rate. Pt reports some baseline impulsivity; however, SLP educated patient that his current conditions require pausing to think and then act for safety concerns. Pt verbalized understanding as he reports he almost fell as he did not stop to balance himself prior to walking. Cognitive Function PROM completed this session, with score fo 56. Pt indicated "somewhat difficult" remembering where things are placed, learning new tasks or instructions, trouble concentrating, getting started on simple tasks, and trouble making decisions.      Assessment / Recommendations / Plan   Plan Continue with current plan of care      Progression Toward Goals   Progression toward goals Progressing toward goals              SLP Education - 04/14/21 1039     Education Details slow down, double check, attention to detail HEP    Person(s) Educated Patient    Methods  Explanation;Demonstration;Handout;Verbal cues    Comprehension Verbalized understanding;Returned demonstration;Need further instruction;Verbal cues required              SLP Short Term Goals - 04/14/21 1004       SLP SHORT TERM GOAL #1   Title Pt will use memory compensations for appointments, medicine management, and other daily activities with occasional min A over 2 sessions    Baseline 04-14-21    Time 4    Period Weeks    Status On-going    Target Date 05/02/21      SLP SHORT TERM GOAL #2   Title Pt will demonstrate selective attention to auditory/written instructions for completion of simple-mod complex task given 2 or less repetitions over 2 sessions    Baseline 04-14-21    Time 4    Period Weeks    Status On-going    Target Date 05/02/21      SLP SHORT TERM GOAL #3   Title Pt will ID and correct errors on structured cognitive tasks with 80% accuracy given occasional min A over 2 sessions    Baseline 04-14-21    Time 4    Period Weeks    Status On-going    Target Date 05/02/21      SLP SHORT TERM GOAL #4   Title Pt will complete simple to mod complex executive functioning tasks with 80% accuracy given occasional min A over 2 sessions    Time 4    Period Weeks    Status On-going    Target Date 05/02/21      SLP SHORT TERM GOAL #5   Title Pt/caregiver will complete cognitive PROM in first 1-2 sessions    Baseline Cognitive Function=56    Time --    Period --    Status Achieved    Target Date 05/02/21              SLP Long Term Goals - 04/14/21 1004       SLP LONG TERM GOAL #1   Title Pt will use memory compensations for appointments, medicine management, and other daily activities with rare min A over 2 sessions    Time 8    Period Weeks    Status On-going  Target Date 05/30/21      SLP LONG TERM GOAL #2   Title Pt will ID and correct errors on structured cognitive tasks with 90% accuracy given rare min A over 2 sessions    Time 8    Period  Weeks    Status On-going    Target Date 05/30/21      SLP LONG TERM GOAL #3   Title Pt will complete simple to mod complex executive functioning tasks with 80% accuracy given rare min A over 2 sessions    Time 8    Period Weeks    Status On-going    Target Date 05/30/21      SLP LONG TERM GOAL #4   Title Pt/caregiver will report less frustration and report improved cognitive functioning via PROM by 2 past at last ST session    Time 8    Period Weeks    Status On-going    Target Date 05/30/21              Plan - 04/14/21 1005     Clinical Impression Statement Harsh presents to OPST intervention secondary to memory loss following COVID-19. Reduced awareness of current cognitive deficits indicated. SLP targeted attention to detail and following direction tasks to address error awareness and attention, in which pt required occasional fading to rare min A to ID and correct errors. SLP recommended pt double check work, slow down and re-read instructions, and pause prior to initiating actions. Skilled ST is warranted to address cognitive communication skills to improve functional independence and safety as well as reduce caregiver burden and frustration related to cognitive impairment.    Speech Therapy Frequency 2x / week    Duration 8 weeks    Treatment/Interventions Compensatory strategies;Cueing hierarchy;Functional tasks;Patient/family education;Cognitive reorganization;Compensatory techniques;Internal/external aids;SLP instruction and feedback;Environmental controls    Potential to [place] provided    Consulted and Agree with Plan of Care Patient             Patient will benefit from skilled therapeutic intervention in order to improve the following deficits and impairments:   Cognitive communication deficit    Problem List Patient Active Problem List   Diagnosis Date Noted   Enterococcal bacteremia    History of stroke     Prosthetic valve endocarditis ([place])    Bacteremia due to Gram-positive bacteria 02/27/2021   Anemia 02/26/2021   Weakness generalized 02/26/2021   CKD (chronic kidney disease) stage 4, GFR 15-29 ml/min ([place]) 02/26/2021   Atrial fibrillation, chronic (HCC) 02/26/2021   Chronic anticoagulation 02/26/2021   Prolonged QT interval 02/26/2021   Depressive disorder 02/25/2021   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[place]) 02/25/2021   Squamous cell carcinoma of skin of face 02/25/2021   Actinic keratosis 02/25/2021   Anxiety state 02/25/2021   Basal cell carcinoma of nose 02/25/2021   Basal cell carcinoma of skin of unspecified ear and external auricular canal 02/25/2021   Benign prostatic hyperplasia with lower urinary tract symptoms 02/25/2021   Bilateral pseudophakia 02/25/2021   Carcinoma in situ of skin of other parts of face 02/25/2021   Dental caries on smooth surface penetrating into dentin 02/25/2021   Deposits (accretions) on teeth 02/25/2021   Diverticulitis of colon 02/25/2021   Encounter for fitting and adjustment of hearing aid 02/25/2021   Encounter for removal of sutures 02/25/2021   Esophageal reflux 02/25/2021   Generalized osteoarthrosis, involving multiple sites 02/25/2021   Gout, unspecified 02/25/2021   History  of other malignant neoplasm of skin 02/25/2021   Male erectile dysfunction, unspecified 02/25/2021   Peptic ulcer 02/25/2021   Post-traumatic stress disorder, unspecified 02/25/2021   Psychosexual dysfunction with inhibited sexual excitement 02/25/2021   Sensorineural hearing loss, bilateral 02/25/2021   Unspecified atrial fibrillation ([place]) 02/25/2021   Protein-calorie malnutrition, severe 02/04/2021   Cerebral thrombosis with cerebral infarction 02/02/2021   Right sided weakness 02/01/2021   History of COVID-19 01/29/2021   Memory loss 01/29/2021   Olfactory impairment 01/29/2021   Gait disturbance 01/29/2021   Fatigue 01/29/2021   Physical deconditioning 01/29/2021    Pressure injury of skin 01/07/2021   AKI (acute kidney injury) ([place]) 01/06/2021   Generalized weakness 01/06/2021   ARF (acute renal failure) ([place]) 01/06/2021   Dyspnea 10/05/2020   CHF (congestive heart failure) ([place]) 10/04/2020   Carpal tunnel syndrome of right wrist 09/25/2020   Pain of left hand 09/25/2020   COVID-19 virus infection 05/24/2020   Hypertensive chronic kidney disease with stage 1 through stage 4 chronic kidney disease, or unspecified chronic kidney disease    Coronary artery disease    Type II diabetes mellitus ([place])    Hypertension    Back pain 04/08/2020   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/05/2019   Pain in right foot 03/22/2018   Cellulitis of left foot 03/01/2018   Pain of joint of left ankle and foot 09/06/2017   Cancer ([place])    Other specified counseling 04/03/2016   Pneumothorax on right 03/29/2016   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/25/2016   Atherosclerotic heart disease of native coronary artery without angina pectoris 03/10/2016   Heart failure ([place]) 03/10/2016   Cataract extraction status of eye 03/10/2016   Other long term (current) drug therapy 03/10/2016   Osteoarthritis of knee 03/10/2016   Vitamin D deficiency 03/10/2016   Mitral regurgitation    Avitaminosis D 12/25/2015   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   Arthritis of knee, degenerative 12/25/2015   H/O cataract extraction 12/25/2015   Atherosclerotic heart disease of native coronary artery with other forms of angina pectoris ([place]) 12/25/2015   Congestive heart failure ([place]) 12/25/2015   Pure hypercholesterolemia 12/25/2015   Allergic rhinitis 12/25/2015   Essential (primary) hypertension 12/25/2015   Hypotension 01/29/2014   Expected Blood loss, postoperative 04/29/2012   Pain due to unicompartmental arthroplasty of knee ([place]) 04/27/2012   Preoperative evaluation of a medical condition to rule out surgical contraindications (TAR required) 35/70/1779   Diastolic CHF, chronic  ([place]) 06/29/2011   Preoperative evaluation to rule out surgical contraindication 06/29/2011   CAROTID ARTERY STENOSIS 07/09/2010   TIA 05/29/2010   FATIGUE / MALAISE 05/29/2010   CAD, AUTOLOGOUS BYPASS GRAFT 11/15/2008   MYOCARDIAL INFARCTION 12/18/2007   ALLERGIC RHINITIS WITH CONJUNCTIVITIS 12/18/2007   Type 2 diabetes mellitus without complications ([place]) 39/10/90   Other and unspecified hyperlipidemia 12/05/2007   Other ill-defined and unknown causes of morbidity and mortality 12/05/2007   Essential hypertension 12/05/2007   Coronary atherosclerosis 12/05/2007   DEGENERATIVE JOINT DISEASE 12/05/2007   Obstructive sleep apnea of adult 12/05/2007    Alinda Deem, MA CCC-SLP 04/14/2021, 10:49 AM  [facility] [address] [place], [place], [address] Phone: [phone]   Fax:  [phone]   Name: TATSUYA OKRAY MRN: [id number] Date of Birth: 23-Oct-1945

## 2021-04-14 NOTE — Patient Instructions (Addendum)
  Today's lesson: slow down, take your time, think it through, and double check!  You can avoid a lot of errors just by being mindful and taking your time.   This will take practice since you are used to acting quickly.

## 2021-04-14 NOTE — Procedures (Signed)
PROCEDURE SUMMARY:  Successful removal of tunneled CVC.  Patient tolerated well.  EBL < 1 mL  See full dictation in Imaging for details.  Armando Gang Renna Kilmer PA-C 04/14/2021 11:33 AM

## 2021-04-16 ENCOUNTER — Ambulatory Visit: Payer: Medicare Other

## 2021-04-16 ENCOUNTER — Other Ambulatory Visit: Payer: Self-pay

## 2021-04-16 DIAGNOSIS — R41841 Cognitive communication deficit: Secondary | ICD-10-CM

## 2021-04-16 NOTE — Therapy (Signed)
[facility] [address] [place], [place], [address] Phone: [phone]   Fax:  [phone]  Speech Language Pathology Treatment  Patient Details  Name: [name] MRN: [id number] Date of Birth: September 12, 1945 Referring Provider (SLP): Fenton Foy, NP   Encounter Date: 04/16/2021   End of Session - 04/16/21 1430     Visit Number 4    Number of Visits 17    Date for SLP Re-Evaluation 05/27/21    SLP Start Time 58    SLP Stop Time  3762    SLP Time Calculation (min) 45 min    Activity Tolerance Patient tolerated treatment well             Past Medical History:  Diagnosis Date   Allergic rhinitis    Anxiety    Atrial fibrillation ([place])    Cancer ([place])    hx of skin cancers    Chronic kidney disease    Coronary artery disease    a. s/p MI & CABG; b. s/p PCI Diag;  c. Cath 12/2011: LAD diff dzs/small, Diag 75% isr, 90 dist to stent (small), LCX & RCA occluded, VG->OM 40, VG->PDA patent - med rx.   COVID-19    x 3   Depression    PTSD   Diabetes mellitus    not on medications   Diastolic CHF, chronic ([place])    a. EF 55-60% by echo 2007   GERD (gastroesophageal reflux disease)    "not anymore" " I had a lap band"   History of diverticulitis of colon    5 YRS AGO   History of pneumonia    2017   History of transient ischemic attack (TIA)    CAROTID DOPPLERS NOV 2011  0-39& BIL. STENOSIS   Hyperlipidemia    Hypertension    Kidney failure    Mitral regurgitation    Myocardial infarction [facility])    Neuromuscular disorder (HCC)    left arm numbness    OA (osteoarthritis)    RIGHT KNEE ARTHOFIBROSIS W/ PAIN  (S/P  REPLACEMENT 2004)   PTSD (post-traumatic stress disorder)    from [place] since 1973   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/25/2016   Complex valvuloplasty including artificial Gore-tex neochord placement x4, plication of anterior commissure, and 26 mm Sorin Memo 3D ring  annuloplasty via right mini thoracotomy approach   Shortness of breath dyspnea    with exertion   Skin cancer    Sleep apnea    cpap- see ov note in EPIC 05/12/11 for settings    Stroke [facility])    hx of   Stroke [facility])     Past Surgical History:  Procedure Laterality Date   AV FISTULA PLACEMENT Left 08/02/2018   Procedure: ARTERIOVENOUS (AV) FISTULA CREATION RADIOCEPHALIC;  Surgeon: Angelia Mould, MD;  Location: [place];  Service: Vascular;  Laterality: Left;   BLEPHAROPLASTY  [place]  2001, 2004, 2009, 2011   CARDIAC CATHETERIZATION N/A 01/23/2016   Procedure: Right/Left Heart Cath and Coronary Angiography;  Surgeon: [name], MD;  Location: [place] CV LAB;  Service: Cardiovascular;  Laterality: N/A;   CARDIOVERSION N/A 09/07/2017   Procedure: CARDIOVERSION;  Surgeon: [name], MD;  Location: [facility] ENDOSCOPY;  Service: Cardiovascular;  Laterality: N/A;   CARDIOVERSION N/A 09/13/2019   Procedure: CARDIOVERSION;  Surgeon: [name], MD;  Location: [place];  Service: Cardiovascular;  Laterality: N/A;  CARDIOVERSION N/A 10/19/2019   Procedure: CARDIOVERSION;  Surgeon: [name], MD;  Location: [facility] ENDOSCOPY;  Service: Cardiovascular;  Laterality: N/A;   CATARACT EXTRACTION W/ INTRAOCULAR LENS IMPLANT Bilateral    CHONDROPLASTY  07/22/2011   Procedure: CHONDROPLASTY;  Surgeon: Dione Plover Aluisio;  Location: [place];  Service: Orthopedics;;   CIRCUMCISION  [place]-- POST CABG   W/ STENT, last cath 01/07/2012    CORONARY ARTERY BYPASS GRAFT  1995   X3 VESSEL   CORONARY ARTERY BYPASS GRAFT  1995   CORONARY STENT PLACEMENT     CYSTOSCOPY/URETEROSCOPY/HOLMIUM LASER/STENT PLACEMENT Right 01/08/2021   Procedure: CYSTOSCOPY/RETROGRADE/URETEROSCOPY/HOLMIUM LASER/STENT PLACEMENT;  Surgeon: Franchot Gallo, MD;  Location: [facility];  Service: Urology;  Laterality:  Right;   IR PERC TUN PERIT CATH WO PORT S&I /IMAG  03/06/2021   IR REMOVAL TUN CV CATH W/O FL  04/14/2021   KNEE ARTHROSCOPY  07/22/2011   Procedure: ARTHROSCOPY KNEE;  Surgeon: Dione Plover Aluisio;  Location: [place];  Service: Orthopedics;  Laterality: Right;  WITH DEBRIDEMENT    KNEE ARTHROSCOPY W/ MENISCECTOMY  X2 IN 2002-- RIGHT KNEE   LAPAROSCOPIC GASTRIC BANDING  01-15-09   LEFT HEART CATH AND CORONARY ANGIOGRAPHY N/A 04/29/2017   Procedure: LEFT HEART CATH AND CORONARY ANGIOGRAPHY;  Surgeon: [name], MD;  Location: [place] CV LAB;  Service: Cardiovascular;  Laterality: N/A;   LEFT HEART CATHETERIZATION WITH CORONARY ANGIOGRAM N/A 09/11/2013   Procedure: LEFT HEART CATHETERIZATION WITH CORONARY ANGIOGRAM;  Surgeon: [name], MD;  Location: [facility] CATH LAB;  Service: Cardiovascular;  Laterality: N/A;   MITRAL VALVE REPAIR Right 03/25/2016   Procedure: MINIMALLY INVASIVE REOPERATION FOR MITRAL VALVE REPAIR  (MVR) with size 26 Sorin Memo 3D;  Surgeon: [name], MD;  Location: [place];  Service: Open Heart Surgery;  Laterality: Right;   MITRAL VALVE REPAIR  03/2016   RIGHT KNEE ED COMPARTMENT REPLACEMENT  2004   SYNOVECTOMY  07/22/2011   Procedure: SYNOVECTOMY;  Surgeon: Dione Plover Aluisio;  Location: [place];  Service: Orthopedics;;   TEE WITHOUT CARDIOVERSION N/A 01/23/2016   Procedure: TRANSESOPHAGEAL ECHOCARDIOGRAM (TEE);  Surgeon: [name], MD;  Location: [place];  Service: Cardiovascular;  Laterality: N/A;   TEE WITHOUT CARDIOVERSION N/A 03/25/2016   Procedure: TRANSESOPHAGEAL ECHOCARDIOGRAM (TEE);  Surgeon: [name], MD;  Location: [place];  Service: Open Heart Surgery;  Laterality: N/A;   TEE WITHOUT CARDIOVERSION N/A 03/04/2021   Procedure: TRANSESOPHAGEAL ECHOCARDIOGRAM (TEE);  Surgeon: Lelon Perla, MD;  Location: [facility] ENDOSCOPY;  Service: Cardiovascular;  Laterality: N/A;   UMBILICAL HERNIA REPAIR  01-15-09   W/  GASTRIC BANDING PROCEDURE    There were no vitals filed for this visit.   Subjective Assessment - 04/16/21 1433     Subjective "I'm sorry about this morning. There was traffic."    Currently in Pain? No/denies                   ADULT SLP TREATMENT - 04/16/21 1429       General Information   Behavior/Cognition Alert;Cooperative;Pleasant mood      Treatment Provided   Treatment provided Cognitive-Linquistic      Cognitive-Linquistic Treatment   Treatment focused on Cognition;Patient/family/caregiver education    Skilled Treatment Pt missed morning ST session due to traffic. Pt arrived for afternoon session on time. Pt recalled rationale for missed  appointment. Pt forgot HEP folder in wife's car but recalled focusing on slowing down thought process per last ST session recommendation. SLP targeted attention to detail and error awarenss for written task. Pt independently double checked work without prompt, which improved performance as pt able to correct and/or ID additional items. Pt noted to miss items x6 during verbal recap of tasks (often located on left hand side). Pt denied any left neglect, but SLP will continue to monitor.      Assessment / Recommendations / Plan   Plan Continue with current plan of care      Progression Toward Goals   Progression toward goals Progressing toward goals              SLP Education - 04/16/21 1514     Education Details slow down, double check, re-read instructions 2-3x    Person(s) Educated Patient    Methods Explanation;Demonstration;Verbal cues;Handout    Comprehension Verbalized understanding;Returned demonstration;Need further instruction              SLP Short Term Goals - 04/16/21 1439       SLP SHORT TERM GOAL #1   Title Pt will use memory compensations for appointments, medicine management, and other daily activities with occasional min A over 2 sessions    Baseline 04-14-21    Time 4    Period Weeks    Status  On-going    Target Date 05/02/21      SLP SHORT TERM GOAL #2   Title Pt will demonstrate selective attention to auditory/written instructions for completion of simple-mod complex task given 2 or less repetitions over 2 sessions    Baseline 04-14-21    Time 4    Period Weeks    Status On-going    Target Date 05/02/21      SLP SHORT TERM GOAL #3   Title Pt will ID and correct errors on structured cognitive tasks with 80% accuracy given occasional min A over 2 sessions    Baseline 04-14-21    Time 4    Period Weeks    Status On-going    Target Date 05/02/21      SLP SHORT TERM GOAL #4   Title Pt will complete simple to mod complex executive functioning tasks with 80% accuracy given occasional min A over 2 sessions    Time 4    Period Weeks    Status On-going    Target Date 05/02/21      SLP SHORT TERM GOAL #5   Title Pt/caregiver will complete cognitive PROM in first 1-2 sessions    Baseline Cognitive Function=56    Status Achieved    Target Date 05/02/21              SLP Long Term Goals - 04/16/21 1439       SLP LONG TERM GOAL #1   Title Pt will use memory compensations for appointments, medicine management, and other daily activities with rare min A over 2 sessions    Time 8    Period Weeks    Status On-going    Target Date 05/30/21      SLP LONG TERM GOAL #2   Title Pt will ID and correct errors on structured cognitive tasks with 90% accuracy given rare min A over 2 sessions    Time 8    Period Weeks    Status On-going    Target Date 05/30/21      SLP LONG TERM GOAL #3  Title Pt will complete simple to mod complex executive functioning tasks with 80% accuracy given rare min A over 2 sessions    Time 8    Period Weeks    Status On-going    Target Date 05/30/21      SLP LONG TERM GOAL #4   Title Pt/caregiver will report less frustration and report improved cognitive functioning via PROM by 2 past at last ST session    Time 8    Period Weeks    Status  On-going    Target Date 05/30/21              Plan - 04/16/21 1438     Clinical Impression Statement Kiefer presents to OPST intervention to address cognitive changes s/p contraction of COVID-19.SLP targeted attention to detail and following direction tasks to address error awareness and attention, in which pt required occasional fading to rare min A to ID and correct errors. SLP recommended pt slow down and re-read instruction and pause prior to initiating actions. Skilled ST is warranted to address cognitive communication skills to improve functional independence and safety as well as reduce caregiver burden and frustration related to cognitive impairment.    Speech Therapy Frequency 2x / week    Duration 8 weeks    Treatment/Interventions Compensatory strategies;Cueing hierarchy;Functional tasks;Patient/family education;Cognitive reorganization;Compensatory techniques;Internal/external aids;SLP instruction and feedback;Environmental controls    Potential to [place] provided    Consulted and Agree with Plan of Care Patient             Patient will benefit from skilled therapeutic intervention in order to improve the following deficits and impairments:   Cognitive communication deficit    Problem List Patient Active Problem List   Diagnosis Date Noted   Enterococcal bacteremia    History of stroke    Prosthetic valve endocarditis ([place])    Bacteremia due to Gram-positive bacteria 02/27/2021   Anemia 02/26/2021   Weakness generalized 02/26/2021   CKD (chronic kidney disease) stage 4, GFR 15-29 ml/min ([place]) 02/26/2021   Atrial fibrillation, chronic (HCC) 02/26/2021   Chronic anticoagulation 02/26/2021   Prolonged QT interval 02/26/2021   Depressive disorder 02/25/2021   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[place]) 02/25/2021   Squamous cell carcinoma of skin of face 02/25/2021   Actinic keratosis 02/25/2021   Anxiety state 02/25/2021   Basal cell  carcinoma of nose 02/25/2021   Basal cell carcinoma of skin of unspecified ear and external auricular canal 02/25/2021   Benign prostatic hyperplasia with lower urinary tract symptoms 02/25/2021   Bilateral pseudophakia 02/25/2021   Carcinoma in situ of skin of other parts of face 02/25/2021   Dental caries on smooth surface penetrating into dentin 02/25/2021   Deposits (accretions) on teeth 02/25/2021   Diverticulitis of colon 02/25/2021   Encounter for fitting and adjustment of hearing aid 02/25/2021   Encounter for removal of sutures 02/25/2021   Esophageal reflux 02/25/2021   Generalized osteoarthrosis, involving multiple sites 02/25/2021   Gout, unspecified 02/25/2021   History of other malignant neoplasm of skin 02/25/2021   Male erectile dysfunction, unspecified 02/25/2021   Peptic ulcer 02/25/2021   Post-traumatic stress disorder, unspecified 02/25/2021   Psychosexual dysfunction with inhibited sexual excitement 02/25/2021   Sensorineural hearing loss, bilateral 02/25/2021   Unspecified atrial fibrillation ([place]) 02/25/2021   Protein-calorie malnutrition, severe 02/04/2021   Cerebral thrombosis with cerebral infarction 02/02/2021   Right sided weakness 02/01/2021   History of COVID-19 01/29/2021   Memory  loss 01/29/2021   Olfactory impairment 01/29/2021   Gait disturbance 01/29/2021   Fatigue 01/29/2021   Physical deconditioning 01/29/2021   Pressure injury of skin 01/07/2021   AKI (acute kidney injury) ([place]) 01/06/2021   Generalized weakness 01/06/2021   ARF (acute renal failure) ([place]) 01/06/2021   Dyspnea 10/05/2020   CHF (congestive heart failure) ([place]) 10/04/2020   Carpal tunnel syndrome of right wrist 09/25/2020   Pain of left hand 09/25/2020   COVID-19 virus infection 05/24/2020   Hypertensive chronic kidney disease with stage 1 through stage 4 chronic kidney disease, or unspecified chronic kidney disease    Coronary artery disease    Type II diabetes mellitus  ([place])    Hypertension    Back pain 04/08/2020   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/05/2019   Pain in right foot 03/22/2018   Cellulitis of left foot 03/01/2018   Pain of joint of left ankle and foot 09/06/2017   Cancer ([place])    Other specified counseling 04/03/2016   Pneumothorax on right 03/29/2016   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/25/2016   Atherosclerotic heart disease of native coronary artery without angina pectoris 03/10/2016   Heart failure ([place]) 03/10/2016   Cataract extraction status of eye 03/10/2016   Other long term (current) drug therapy 03/10/2016   Osteoarthritis of knee 03/10/2016   Vitamin D deficiency 03/10/2016   Mitral regurgitation    Avitaminosis D 12/25/2015   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   Arthritis of knee, degenerative 12/25/2015   H/O cataract extraction 12/25/2015   Atherosclerotic heart disease of native coronary artery with other forms of angina pectoris ([place]) 12/25/2015   Congestive heart failure ([place]) 12/25/2015   Pure hypercholesterolemia 12/25/2015   Allergic rhinitis 12/25/2015   Essential (primary) hypertension 12/25/2015   Hypotension 01/29/2014   Expected Blood loss, postoperative 04/29/2012   Pain due to unicompartmental arthroplasty of knee ([place]) 04/27/2012   Preoperative evaluation of a medical condition to rule out surgical contraindications (TAR required) 86/57/8469   Diastolic CHF, chronic ([place]) 06/29/2011   Preoperative evaluation to rule out surgical contraindication 06/29/2011   CAROTID ARTERY STENOSIS 07/09/2010   TIA 05/29/2010   FATIGUE / MALAISE 05/29/2010   CAD, AUTOLOGOUS BYPASS GRAFT 11/15/2008   MYOCARDIAL INFARCTION 12/18/2007   ALLERGIC RHINITIS WITH CONJUNCTIVITIS 12/18/2007   Type 2 diabetes mellitus without complications ([place]) 62/95/2841   Other and unspecified hyperlipidemia 12/05/2007   Other ill-defined and unknown causes of morbidity and mortality 12/05/2007   Essential hypertension 12/05/2007    Coronary atherosclerosis 12/05/2007   DEGENERATIVE JOINT DISEASE 12/05/2007   Obstructive sleep apnea of adult 12/05/2007    Alinda Deem, MA CCC-SLP 04/16/2021, 3:17 PM  [facility] [address] [place], [place], [address] Phone: [phone]   Fax:  [phone]   Name: [name] MRN: [id number] Date of Birth: 1946-01-28

## 2021-04-17 ENCOUNTER — Ambulatory Visit (HOSPITAL_COMMUNITY)
Admission: RE | Admit: 2021-04-17 | Discharge: 2021-04-17 | Disposition: A | Discharge status: Home / Self Care | Payer: Medicare Other | Source: Ambulatory Visit | Attending: Cardiology | Admitting: Cardiology

## 2021-04-17 ENCOUNTER — Telehealth: Payer: Self-pay

## 2021-04-17 DIAGNOSIS — I509 Heart failure, unspecified: Secondary | ICD-10-CM | POA: Insufficient documentation

## 2021-04-17 DIAGNOSIS — D509 Iron deficiency anemia, unspecified: Secondary | ICD-10-CM | POA: Insufficient documentation

## 2021-04-17 MED ORDER — FERUMOXYTOL INJECTION 510 MG/17 ML
510.0000 mg | Freq: Once | INTRAVENOUS | Status: AC
Start: 2021-04-17 — End: 2021-04-17
  Administered 2021-04-17: 510 mg via INTRAVENOUS
  Filled 2021-04-17: qty 510

## 2021-04-17 NOTE — Telephone Encounter (Signed)
Echo results reviewed with patient. No questions or concerns at this time. Instructed to follow up as discussed.   Tsuneo Faison Lorita Officer, RN

## 2021-04-17 NOTE — Telephone Encounter (Signed)
-----   Message from Jabier Mutton, MD sent at 04/17/2021 11:45 AM EDT ----- Please let patient know Echo showed nothing concerning. There is persistent vegetation from prior endocarditis which is anticipated. No need to change any treatment plan  He can f/u as previously discussed, with Dr Baxter Flattery. thanks

## 2021-04-18 ENCOUNTER — Other Ambulatory Visit: Payer: Self-pay | Admitting: *Deleted

## 2021-04-18 NOTE — Patient Outreach (Signed)
[facility]) Care Management  04/18/2021  [name] 06/20/1946 [id number]   Outgoing call placed to member/wife, no answer, HIPAA compliant voice message left.  Will follow up within the next 3-4 business days.  Valente David, [place], MSN Mobile [phone]

## 2021-04-21 DIAGNOSIS — I120 Hypertensive chronic kidney disease with stage 5 chronic kidney disease or end stage renal disease: Secondary | ICD-10-CM | POA: Diagnosis not present

## 2021-04-21 DIAGNOSIS — N185 Chronic kidney disease, stage 5: Secondary | ICD-10-CM | POA: Diagnosis not present

## 2021-04-21 DIAGNOSIS — N200 Calculus of kidney: Secondary | ICD-10-CM | POA: Diagnosis not present

## 2021-04-21 DIAGNOSIS — E1122 Type 2 diabetes mellitus with diabetic chronic kidney disease: Secondary | ICD-10-CM | POA: Diagnosis not present

## 2021-04-21 DIAGNOSIS — I4891 Unspecified atrial fibrillation: Secondary | ICD-10-CM | POA: Diagnosis not present

## 2021-04-21 DIAGNOSIS — I509 Heart failure, unspecified: Secondary | ICD-10-CM | POA: Diagnosis not present

## 2021-04-21 DIAGNOSIS — N2581 Secondary hyperparathyroidism of renal origin: Secondary | ICD-10-CM | POA: Diagnosis not present

## 2021-04-21 DIAGNOSIS — D631 Anemia in chronic kidney disease: Secondary | ICD-10-CM | POA: Diagnosis not present

## 2021-04-21 DIAGNOSIS — M109 Gout, unspecified: Secondary | ICD-10-CM | POA: Diagnosis not present

## 2021-04-23 ENCOUNTER — Other Ambulatory Visit: Payer: Self-pay | Admitting: *Deleted

## 2021-04-23 ENCOUNTER — Other Ambulatory Visit: Payer: Self-pay

## 2021-04-23 ENCOUNTER — Ambulatory Visit (INDEPENDENT_AMBULATORY_CARE_PROVIDER_SITE_OTHER): Payer: Medicare Other | Admitting: Internal Medicine

## 2021-04-23 VITALS — BP 138/70 | HR 56 | Temp 98.0°F | Resp 17 | Ht 70.5 in | Wt 185.0 lb

## 2021-04-23 DIAGNOSIS — I330 Acute and subacute infective endocarditis: Secondary | ICD-10-CM | POA: Diagnosis not present

## 2021-04-23 NOTE — Patient Instructions (Signed)
You are well treated for heart valve infection.    For the next 4 weeks, continue to monitor for fever, chill, malaise, or any persistent abnormal sign/symptoms. If present please give our clinic a call  No need to schedule any follow up with our infectious disease clinic at this time

## 2021-04-23 NOTE — Patient Outreach (Signed)
[facility]) Care Management  04/23/2021  [name] 1946/05/19 [id number]   Outreach attempt #2, successful to wife, member also present and heard in the background.  Denies any urgent concerns, encouraged to contact this care manager with questions.  Agrees to follow up within the next 2 weeks.   Goals Addressed             This Visit's Progress    [facility]N - Keep or Improve My Strength-Stroke   On track    Timeframe:  Long-Range Goal Priority:  Medium Start Date:     8/4                        Expected End Date:    11/4                   Barriers: Knowledge    - arrange in-home help services - eat healthy to increase strength - know who to call for help if I fall    Why is this important?   Before the stroke you probably did not think much about being safe when you are up and about.  Now, it may be harder for you to get around.  It may also be easier for you to trip or fall.  It is common to have muscle weakness after a stroke. You may also feel like you cannot control an arm or leg.  It will be helpful to work with a physical therapist to get your strength and muscle control back.  It is good to stay as active as you can. Walking and stretching help you stay strong and flexible.  The physical therapist will develop an exercise program just for you.     Notes:   8/31 - Call placed to [place], notified that member has completed [facility] PT and will need outpatient PT to continue advancement.  Member has had another fall since discharge, encouraged to use walker and/or cane for more stability.  Wife would like member to attend [facility] PT, call placed, they do not have current order.  Call placed to PCP office to request order be placed.  Provided with [name] fax number [phone])      [facility] - Make and Keep All Appointments   On track    Timeframe:  Short-Term Goal Priority:  High Start Date:      8/4                        Expected End Date:        10/4               Barriers: Knowledge  Follow Up Date 04/18/2021    - ask family or friend for a ride - call to cancel if needed - keep a calendar with appointment dates    Why is this important?   Part of staying healthy is seeing the doctor for follow-up care.  If you forget your appointments, there are some things you can do to stay on track.    Notes:   8/31 - Member has attended PCP appointment since discharge, was to follow up yesterday but office had to reschedule for next month.  Will follow up with ID today for assessment of infection       Valente David, RN, MSN [place] Manager [phone]

## 2021-04-23 NOTE — Progress Notes (Signed)
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  Doing well on betalactam 7/06 bcx positive 7/08 bcx negative  6 weeks planned abx 7/08-8/19  ----------------- 8/31 id assessment Patient finished 6 weeks double beta-lactam on 8/19 His central line is removed Tte post tx with persistent mv annuloplasty vegetation not larger than prior to treatment, likely residual sterile thrombus post antibiotics Reviewed OPAT labs 8/16  Patient is well treated without evidence of abx failure   Patient reports feeling cold all the time for the past few years; tsh is normal; chemistry profile and other sx don't suggest adrenal issue Not related to infection   PLAN: Discussed s/s of recrudescence  No need for scheduled id f/u Reviewed scabbing from LLE after fall  --> no sign of cellulitis Follow up with pcp regarding subjective feeling cold  I have spent a total of 20 minutes of face-to-face and non-face-to-face time, excluding clinical staff time, preparing to see patient, ordering tests and/or medications, and provide counseling the patient   Allergies  Allergen Reactions   Crestor [Rosuvastatin] Other (See Comments)    Muscle aches    Lipitor [Atorvastatin] Other (See Comments)    Muscle aches   Pravastatin Other (See Comments)    Muscle aches   Carvedilol     Other reaction(s): loss of appetite   Metformin Diarrhea   Serotonin Reuptake Inhibitors (Ssris)     Other reaction(s): Muscle aches   Zocor [Simvastatin]     Other reaction(s): Muscle pain   Codeine Itching and Other (See Comments)    Extremity tingling-- can take synthetic    Scheduled Meds: Continuous Infusions: PRN Meds:.   SUBJECTIVE: Doing well Chronic feelign cold for years Right chest picc removed No f/c No n/v No  rash/diarrhea  Felt and hit left shin 4-5 days ago; no purulence/redness around the scrape  Review of Systems: ROS All other ROS was negative, except mentioned above     OBJECTIVE: Vitals:   04/23/21 1345  BP: 138/70  Pulse: (!) 56  Resp: 17  Temp: 98 F (36.7 C)  TempSrc: Oral  SpO2: 100%  Weight: 185 lb (83.9 kg)  Height: 5' 10.5" (1.791 m)   Body mass index is 26.17 kg/m.  Physical Exam General/constitutional: no distress, pleasant HEENT: Normocephalic, PER, Conj Clear, EOMI, Oropharynx clear Neck supple CV: rrr no mrg Lungs: clear to auscultation, normal respiratory effort Abd: Soft, Nontender Ext: no edema Skin: scabbing left anterior shin no purulence/surrounding erythema or discharge Neuro: nonfocal MSK: LUE AVF with good thrill    Lab Results Lab Results  Component Value Date   WBC 6.6 04/08/2021   HGB 10.1 (L) 04/08/2021   HCT 31.1 (L) 04/08/2021   MCV 102.6 (H) 04/08/2021   PLT 182 04/08/2021    Lab Results  Component Value Date   CREATININE 2.79 (H) 04/08/2021   BUN 25 (H) 04/08/2021   NA 139 04/08/2021   K 3.6 04/08/2021   CL 100 04/08/2021   CO2 29 04/08/2021    Lab Results  Component Value Date   ALT 14 04/08/2021   AST 18 04/08/2021   ALKPHOS 84 04/08/2021   BILITOT 0.8 04/08/2021      Crp: 8/16        2 (<10) 8/08  5 8/03        6 7/28      48 (<10) 7/19      75  Microbiology: No results found for this or any previous visit (from the past 240 hour(s)).   Serology:   Imaging: If present, new imagings (plain films, ct scans, and mri) have been personally visualized and interpreted; radiology reports have been reviewed. Decision making incorporated into the Impression / Recommendations.  7/12 tee  1. S/P MV repair; oscillating density associated with MV annulus likely  suture from previous repair; cannot exclude small vegetation; likely needs  6 weeks antibiotics to cover SBE; no MR; elevated mean gradient (12  mmHg).   2. Left ventricular ejection fraction, by estimation, is 60 to 65%. The  left ventricle has normal function. The left ventricle has no regional  wall motion abnormalities.   3. Right ventricular systolic function is normal. The right ventricular  size is mildly enlarged.   4. Left atrial size was moderately dilated. No left atrial/left atrial  appendage thrombus was detected.   5. The mitral valve has been repaired/replaced. No evidence of mitral  valve regurgitation. There is a present in the mitral position. Procedure  Date: 03/25/2016.   6. The aortic valve is tricuspid. Aortic valve regurgitation is not  visualized.   7. There is mild (Grade II) plaque involving the descending aorta.   8. Evidence of atrial level shunting detected by color flow Doppler.  There is a large patent foramen ovale.   8/19 tte post treatment abx  1. Left ventricular ejection fraction, by estimation, is 55 to 60%. The  left ventricle has normal function. The left ventricle has no regional  wall motion abnormalities. There is moderate asymmetric left ventricular  hypertrophy of the basal-septal  segment. Left ventricular diastolic parameters are indeterminate.   2. Right ventricular systolic function is normal. The right ventricular  size is normal. There is normal pulmonary artery systolic pressure.   3. Left atrial size was severely dilated.   4. Mean mitral valve gradient is 8 mmHg compared with 12 mmHg 02/2021.  There is a filamentous, mobile density on the left atrial side of the  mitral annulus. Previously noted on TEE 02/2021. No significant change.  Likely an annuloplasty suture. The mitral  valve has been repaired/replaced. Mild mitral valve regurgitation. Mild to  moderate mitral stenosis. There is a 26 mm prosthetic annuloplasty ring  present in the mitral position. Procedure Date: 03/25/2016.   5. The aortic valve is tricuspid. There is mild calcification of the  aortic valve. There is  mild thickening of the aortic valve. Aortic valve  regurgitation is not visualized. No aortic stenosis is present.   6. Aortic dilatation noted. There is mild dilatation of the ascending  aorta, measuring 39 mm.   7. The inferior vena cava is normal in size with greater than 50%  respiratory variability, suggesting right atrial pressure of 3 mmHg.   8. Evidence of atrial level shunting detected by color flow Doppler.  There is a large patent foramen ovale with predominantly left to right  shunting across the atrial septum.   Jabier Mutton, [facility] for Infectious [place] [phone] pager    04/23/2021, 1:52 PM

## 2021-04-30 ENCOUNTER — Ambulatory Visit: Payer: Medicare Other

## 2021-04-30 DIAGNOSIS — R262 Difficulty in walking, not elsewhere classified: Secondary | ICD-10-CM | POA: Diagnosis not present

## 2021-04-30 DIAGNOSIS — R296 Repeated falls: Secondary | ICD-10-CM | POA: Diagnosis not present

## 2021-04-30 DIAGNOSIS — R531 Weakness: Secondary | ICD-10-CM | POA: Diagnosis not present

## 2021-04-30 DIAGNOSIS — I69359 Hemiplegia and hemiparesis following cerebral infarction affecting unspecified side: Secondary | ICD-10-CM | POA: Diagnosis not present

## 2021-05-02 ENCOUNTER — Ambulatory Visit: Payer: Medicare Other

## 2021-05-05 ENCOUNTER — Other Ambulatory Visit: Payer: Self-pay

## 2021-05-05 ENCOUNTER — Other Ambulatory Visit: Payer: Self-pay | Admitting: *Deleted

## 2021-05-05 ENCOUNTER — Ambulatory Visit: Payer: Medicare Other | Attending: Nurse Practitioner

## 2021-05-05 DIAGNOSIS — R41841 Cognitive communication deficit: Secondary | ICD-10-CM | POA: Diagnosis not present

## 2021-05-05 NOTE — Therapy (Signed)
[facility] [address] [place], [place], [address] Phone: [phone]   Fax:  [phone]  Speech Language Pathology Treatment  Patient Details  Name: [name] MRN: [id number] Date of Birth: 17-Apr-1946 Referring Provider (SLP): Fenton Foy, NP   Encounter Date: 05/05/2021   End of Session - 05/05/21 1238     Visit Number 5    Number of Visits 17    Date for SLP Re-Evaluation 05/27/21    SLP Start Time 1236   pt arrived late   SLP Stop Time  1315    SLP Time Calculation (min) 39 min    Activity Tolerance Patient tolerated treatment well             Past Medical History:  Diagnosis Date   Allergic rhinitis    Anxiety    Atrial fibrillation ([place])    Cancer ([place])    hx of skin cancers    Chronic kidney disease    Coronary artery disease    a. s/p MI & CABG; b. s/p PCI Diag;  c. Cath 12/2011: LAD diff dzs/small, Diag 75% isr, 90 dist to stent (small), LCX & RCA occluded, VG->OM 40, VG->PDA patent - med rx.   COVID-19    x 3   Depression    PTSD   Diabetes mellitus    not on medications   Diastolic CHF, chronic ([place])    a. EF 55-60% by echo 2007   GERD (gastroesophageal reflux disease)    "not anymore" " I had a lap band"   History of diverticulitis of colon    5 YRS AGO   History of pneumonia    2017   History of transient ischemic attack (TIA)    CAROTID DOPPLERS NOV 2011  0-39& BIL. STENOSIS   Hyperlipidemia    Hypertension    Kidney failure    Mitral regurgitation    Myocardial infarction [facility])    Neuromuscular disorder (HCC)    left arm numbness    OA (osteoarthritis)    RIGHT KNEE ARTHOFIBROSIS W/ PAIN  (S/P  REPLACEMENT 2004)   PTSD (post-traumatic stress disorder)    from [place] since 1973   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/25/2016   Complex valvuloplasty including artificial Gore-tex neochord placement x4, plication of anterior commissure, and 26 mm Sorin Memo  3D ring annuloplasty via right mini thoracotomy approach   Shortness of breath dyspnea    with exertion   Skin cancer    Sleep apnea    cpap- see ov note in EPIC 05/12/11 for settings    Stroke [facility])    hx of   Stroke [facility])     Past Surgical History:  Procedure Laterality Date   AV FISTULA PLACEMENT Left 08/02/2018   Procedure: ARTERIOVENOUS (AV) FISTULA CREATION RADIOCEPHALIC;  Surgeon: Angelia Mould, MD;  Location: [facility];  Service: Vascular;  Laterality: Left;   BLEPHAROPLASTY  [place]  2001, 2004, 2009, 2011   CARDIAC CATHETERIZATION N/A 01/23/2016   Procedure: Right/Left Heart Cath and Coronary Angiography;  Surgeon: [name], MD;  Location: [facility] CV LAB;  Service: Cardiovascular;  Laterality: N/A;   CARDIOVERSION N/A 09/07/2017   Procedure: CARDIOVERSION;  Surgeon: [name], MD;  Location: [facility] ENDOSCOPY;  Service: Cardiovascular;  Laterality: N/A;   CARDIOVERSION N/A 09/13/2019   Procedure: CARDIOVERSION;  Surgeon: [name], MD;  Location: [facility];  Service: Cardiovascular;  Laterality: N/A;   CARDIOVERSION N/A 10/19/2019   Procedure: CARDIOVERSION;  Surgeon: [name], MD;  Location: [facility] ENDOSCOPY;  Service: Cardiovascular;  Laterality: N/A;   CATARACT EXTRACTION W/ INTRAOCULAR LENS IMPLANT Bilateral    CHONDROPLASTY  07/22/2011   Procedure: CHONDROPLASTY;  Surgeon: Dione Plover Aluisio;  Location: [facility];  Service: Orthopedics;;   CIRCUMCISION  [place]-- POST CABG   W/ STENT, last cath 01/07/2012    CORONARY ARTERY BYPASS GRAFT  1995   X3 VESSEL   CORONARY ARTERY BYPASS GRAFT  1995   CORONARY STENT PLACEMENT     CYSTOSCOPY/URETEROSCOPY/HOLMIUM LASER/STENT PLACEMENT Right 01/08/2021   Procedure: CYSTOSCOPY/RETROGRADE/URETEROSCOPY/HOLMIUM LASER/STENT PLACEMENT;  Surgeon: Franchot Gallo, MD;  Location: [facility];  Service: Urology;   Laterality: Right;   IR PERC TUN PERIT CATH WO PORT S&I /IMAG  03/06/2021   IR REMOVAL TUN CV CATH W/O FL  04/14/2021   KNEE ARTHROSCOPY  07/22/2011   Procedure: ARTHROSCOPY KNEE;  Surgeon: Dione Plover Aluisio;  Location: [facility];  Service: Orthopedics;  Laterality: Right;  WITH DEBRIDEMENT    KNEE ARTHROSCOPY W/ MENISCECTOMY  X2 IN 2002-- RIGHT KNEE   LAPAROSCOPIC GASTRIC BANDING  01-15-09   LEFT HEART CATH AND CORONARY ANGIOGRAPHY N/A 04/29/2017   Procedure: LEFT HEART CATH AND CORONARY ANGIOGRAPHY;  Surgeon: [name], MD;  Location: [facility] CV LAB;  Service: Cardiovascular;  Laterality: N/A;   LEFT HEART CATHETERIZATION WITH CORONARY ANGIOGRAM N/A 09/11/2013   Procedure: LEFT HEART CATHETERIZATION WITH CORONARY ANGIOGRAM;  Surgeon: [name], MD;  Location: [facility] CATH LAB;  Service: Cardiovascular;  Laterality: N/A;   MITRAL VALVE REPAIR Right 03/25/2016   Procedure: MINIMALLY INVASIVE REOPERATION FOR MITRAL VALVE REPAIR  (MVR) with size 26 Sorin Memo 3D;  Surgeon: [name], MD;  Location: [facility];  Service: Open Heart Surgery;  Laterality: Right;   MITRAL VALVE REPAIR  03/2016   RIGHT KNEE ED COMPARTMENT REPLACEMENT  2004   SYNOVECTOMY  07/22/2011   Procedure: SYNOVECTOMY;  Surgeon: Dione Plover Aluisio;  Location: [facility];  Service: Orthopedics;;   TEE WITHOUT CARDIOVERSION N/A 01/23/2016   Procedure: TRANSESOPHAGEAL ECHOCARDIOGRAM (TEE);  Surgeon: [name], MD;  Location: [facility];  Service: Cardiovascular;  Laterality: N/A;   TEE WITHOUT CARDIOVERSION N/A 03/25/2016   Procedure: TRANSESOPHAGEAL ECHOCARDIOGRAM (TEE);  Surgeon: [name], MD;  Location: [facility];  Service: Open Heart Surgery;  Laterality: N/A;   TEE WITHOUT CARDIOVERSION N/A 03/04/2021   Procedure: TRANSESOPHAGEAL ECHOCARDIOGRAM (TEE);  Surgeon: Lelon Perla, MD;  Location: [facility] ENDOSCOPY;  Service: Cardiovascular;  Laterality: N/A;   UMBILICAL HERNIA REPAIR  01-15-09    W/ GASTRIC BANDING PROCEDURE    There were no vitals filed for this visit.   Subjective Assessment - 05/05/21 1236     Subjective "I had about 10 falls while you were gone. Well not that many but a few"    Currently in Pain? No/denies                   ADULT SLP TREATMENT - 05/05/21 1236       General Information   Behavior/Cognition Alert;Cooperative;Pleasant mood      Treatment Provided   Treatment provided Cognitive-Linquistic      Cognitive-Linquistic Treatment   Treatment focused on Cognition;Patient/family/caregiver education    Skilled Treatment Pt reports he completed HEP with no difficulty.  Pt stated "I don't have any trouble thinking. My wife thinks I do though." Pt also reported multiple falls since last ST session, related to foot drag. Pt denied any pain or hitting his head. SLP targeted functional attention and recall related to instructional information of household good. SLP instructed underlining to aid recall of important information and use of slow rate while reading due to usual errors. Pt indicates increased difficulty with reading and writing (spelling) s/p stroke. Pt intermittently distracted during task, which pt was self-aware of tangential responses 75% of time. Pt required use of repetition and clarification x1 while answering questions. SLP again cued double check.      Assessment / Recommendations / Plan   Plan Continue with current plan of care      Progression Toward Goals   Progression toward goals Progressing toward goals              SLP Education - 05/05/21 1436     Education Details double check    Person(s) Educated Patient    Methods Explanation;Demonstration    Comprehension Verbalized understanding;Returned demonstration              SLP Short Term Goals - 05/05/21 1304       SLP SHORT TERM GOAL #1   Title Pt will use memory compensations for appointments, medicine management, and other daily activities with  occasional min A over 2 sessions    Baseline 04-14-21, 05-05-21    Status Achieved    Target Date 05/02/21      SLP SHORT TERM GOAL #2   Title Pt will demonstrate selective attention to auditory/written instructions for completion of simple-mod complex task given 2 or less repetitions over 2 sessions    Baseline 04-14-21, 05-05-21    Status Achieved    Target Date 05/02/21      SLP SHORT TERM GOAL #3   Title Pt will ID and correct errors on structured cognitive tasks with 80% accuracy given occasional min A over 2 sessions    Baseline 04-14-21, 05-05-21    Status Achieved    Target Date 05/02/21      SLP SHORT TERM GOAL #4   Title Pt will complete simple to mod complex executive functioning tasks with 80% accuracy given occasional min A over 2 sessions    Baseline 05-05-21    Status Partially Met    Target Date 05/02/21      SLP SHORT TERM GOAL #5   Title Pt/caregiver will complete cognitive PROM in first 1-2 sessions    Baseline Cognitive Function=56    Status Achieved    Target Date 05/02/21              SLP Long Term Goals - 05/05/21 1305       SLP LONG TERM GOAL #1   Title Pt will use memory compensations for appointments, medicine management, and other daily activities with rare min A over 2 sessions    Time 8    Period Weeks    Status On-going    Target Date 05/30/21      SLP LONG TERM GOAL #2   Title Pt will ID and correct errors on structured cognitive tasks with 90% accuracy given rare min A over 2 sessions    Time 8    Period Weeks    Status On-going    Target Date 05/30/21      SLP LONG TERM GOAL #3   Title Pt will complete simple to mod  complex executive functioning tasks with 80% accuracy given rare min A over 2 sessions    Time 8    Period Weeks    Status On-going    Target Date 05/30/21      SLP LONG TERM GOAL #4   Title Pt/caregiver will report less frustration and report improved cognitive functioning via PROM by 2 past at last ST session    Time  8    Period Weeks    Status On-going    Target Date 05/30/21              Plan - 05/05/21 1238     Clinical Impression Statement [name] presents to OPST intervention to address cognitive changes s/p contraction of COVID-19. SLP targeted recall, attention to detail, and following direction tasks to address error awareness and attention, in which pt required rare min A to aid recall as well as ID and correct errors. SLP recommended pt slow down and re-read instruction and pause prior to initiating actions. Skilled ST is warranted to address cognitive communication skills to improve functional independence and safety as well as reduce caregiver burden and frustration related to cognitive impairment.    Speech Therapy Frequency 2x / week    Duration 8 weeks    Treatment/Interventions Compensatory strategies;Cueing hierarchy;Functional tasks;Patient/family education;Cognitive reorganization;Compensatory techniques;Internal/external aids;SLP instruction and feedback;Environmental controls    Potential to [place] [address] provided    Consulted and Agree with Plan of Care Patient             Patient will benefit from skilled therapeutic intervention in order to improve the following deficits and impairments:   Cognitive communication deficit    Problem List Patient Active Problem List   Diagnosis Date Noted   Enterococcal bacteremia    History of stroke    Prosthetic valve endocarditis ([place])    Bacteremia due to Gram-positive bacteria 02/27/2021   Anemia 02/26/2021   Weakness generalized 02/26/2021   CKD (chronic kidney disease) stage 4, GFR 15-29 ml/min ([place]) 02/26/2021   Atrial fibrillation, chronic (HCC) 02/26/2021   Chronic anticoagulation 02/26/2021   Prolonged QT interval 02/26/2021   Depressive disorder 02/25/2021   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/2021   Squamous cell carcinoma of skin of face 02/25/2021   Actinic keratosis 02/25/2021    Anxiety state 02/25/2021   Basal cell carcinoma of nose 02/25/2021   Basal cell carcinoma of skin of unspecified ear and external auricular canal 02/25/2021   Benign prostatic hyperplasia with lower urinary tract symptoms 02/25/2021   Bilateral pseudophakia 02/25/2021   Carcinoma in situ of skin of other parts of face 02/25/2021   Dental caries on smooth surface penetrating into dentin 02/25/2021   Deposits (accretions) on teeth 02/25/2021   Diverticulitis of colon 02/25/2021   Encounter for fitting and adjustment of hearing aid 02/25/2021   Encounter for removal of sutures 02/25/2021   Esophageal reflux 02/25/2021   Generalized osteoarthrosis, involving multiple sites 02/25/2021   Gout, unspecified 02/25/2021   History of other malignant neoplasm of skin 02/25/2021   Male erectile dysfunction, unspecified 02/25/2021   Peptic ulcer 02/25/2021   Post-traumatic stress disorder, unspecified 02/25/2021   Psychosexual dysfunction with inhibited sexual excitement 02/25/2021   Sensorineural hearing loss, bilateral 02/25/2021   Unspecified atrial fibrillation ([place]) 02/25/2021   Protein-calorie malnutrition, severe 02/04/2021   Cerebral thrombosis with cerebral infarction 02/02/2021   Right sided weakness 02/01/2021   History of COVID-19 01/29/2021   Memory loss 01/29/2021  Olfactory impairment 01/29/2021   Gait disturbance 01/29/2021   Fatigue 01/29/2021   Physical deconditioning 01/29/2021   Pressure injury of skin 01/07/2021   AKI (acute kidney injury) ([place]) 01/06/2021   Generalized weakness 01/06/2021   ARF (acute renal failure) ([place]) 01/06/2021   Dyspnea 10/05/2020   CHF (congestive heart failure) ([place]) 10/04/2020   Carpal tunnel syndrome of right wrist 09/25/2020   Pain of left hand 09/25/2020   COVID-19 virus infection 05/24/2020   Hypertensive chronic kidney disease with stage 1 through stage 4 chronic kidney disease, or unspecified chronic kidney disease    Coronary artery  disease    Type II diabetes mellitus ([place])    Hypertension    Back pain 04/08/2020   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/05/2019   Pain in right foot 03/22/2018   Cellulitis of left foot 03/01/2018   Pain of joint of left ankle and foot 09/06/2017   Cancer ([place])    Other specified counseling 04/03/2016   Pneumothorax on right 03/29/2016   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/25/2016   Atherosclerotic heart disease of native coronary artery without angina pectoris 03/10/2016   Heart failure ([place]) 03/10/2016   Cataract extraction status of eye 03/10/2016   Other long term (current) drug therapy 03/10/2016   Osteoarthritis of knee 03/10/2016   Vitamin D deficiency 03/10/2016   Mitral regurgitation    Avitaminosis D 12/25/2015   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   Arthritis of knee, degenerative 12/25/2015   H/O cataract extraction 12/25/2015   Atherosclerotic heart disease of native coronary artery with other forms of angina pectoris ([place]) 12/25/2015   Congestive heart failure ([place]) 12/25/2015   Pure hypercholesterolemia 12/25/2015   Allergic rhinitis 12/25/2015   Essential (primary) hypertension 12/25/2015   Hypotension 01/29/2014   Expected Blood loss, postoperative 04/29/2012   Pain due to unicompartmental arthroplasty of knee ([place]) 04/27/2012   Preoperative evaluation of a medical condition to rule out surgical contraindications (TAR required) 15/17/6160   Diastolic CHF, chronic ([place]) 06/29/2011   Preoperative evaluation to rule out surgical contraindication 06/29/2011   CAROTID ARTERY STENOSIS 07/09/2010   TIA 05/29/2010   FATIGUE / MALAISE 05/29/2010   CAD, AUTOLOGOUS BYPASS GRAFT 11/15/2008   MYOCARDIAL INFARCTION 12/18/2007   ALLERGIC RHINITIS WITH CONJUNCTIVITIS 12/18/2007   Type 2 diabetes mellitus without complications ([place]) 73/71/0626   Other and unspecified hyperlipidemia 12/05/2007   Other ill-defined and unknown causes of morbidity and mortality 12/05/2007    Essential hypertension 12/05/2007   Coronary atherosclerosis 12/05/2007   DEGENERATIVE JOINT DISEASE 12/05/2007   Obstructive sleep apnea of adult 12/05/2007    Alinda Deem, MA CCC-SLP 05/05/2021, 2:36 PM  [facility] [address] [place], [place], [address] Phone: [phone]   Fax:  [phone]   Name: [name] MRN: [id number] Date of Birth: [birth year]

## 2021-05-05 NOTE — Patient Outreach (Signed)
[facility]) Care Management  05/05/2021  [facility] 1946-08-08 [id number]   Call placed to member/wife to follow up on start of outpatient PT, no answer.  HIPAA compliant voice message left.  Will follow up within the next 3-4 business days.  Valente David, [place], MSN [place] [phone]

## 2021-05-05 NOTE — Patient Instructions (Signed)
Some prompts to practice writing:  What was the most rewarding part of your career and why?       What is your favorite thing about your personality and why?        If you could travel back in time, what decade would you choose to live in?        What is one thing you can't live without?

## 2021-05-06 DIAGNOSIS — R531 Weakness: Secondary | ICD-10-CM | POA: Diagnosis not present

## 2021-05-06 DIAGNOSIS — I69359 Hemiplegia and hemiparesis following cerebral infarction affecting unspecified side: Secondary | ICD-10-CM | POA: Diagnosis not present

## 2021-05-06 DIAGNOSIS — R296 Repeated falls: Secondary | ICD-10-CM | POA: Diagnosis not present

## 2021-05-06 DIAGNOSIS — R262 Difficulty in walking, not elsewhere classified: Secondary | ICD-10-CM | POA: Diagnosis not present

## 2021-05-08 DIAGNOSIS — R296 Repeated falls: Secondary | ICD-10-CM | POA: Diagnosis not present

## 2021-05-08 DIAGNOSIS — R531 Weakness: Secondary | ICD-10-CM | POA: Diagnosis not present

## 2021-05-08 DIAGNOSIS — I69359 Hemiplegia and hemiparesis following cerebral infarction affecting unspecified side: Secondary | ICD-10-CM | POA: Diagnosis not present

## 2021-05-08 DIAGNOSIS — R262 Difficulty in walking, not elsewhere classified: Secondary | ICD-10-CM | POA: Diagnosis not present

## 2021-05-09 ENCOUNTER — Ambulatory Visit: Payer: Medicare Other

## 2021-05-09 ENCOUNTER — Other Ambulatory Visit: Payer: Self-pay | Admitting: *Deleted

## 2021-05-09 NOTE — Patient Outreach (Signed)
[place] [facility]) Care Management  05/09/2021  [name] 02-10-1946 [id number]   Outreach attempt #2, successful to member.  Denies any urgent concerns, encouraged to contact this care manager with questions.  Agrees to follow up within the next month.   Goals Addressed             This Visit's Progress    THN - Keep or Improve My Strength-Stroke   On track    Timeframe:  Long-Range Goal Priority:  Medium Start Date:     8/4                        Expected End Date:    11/4                   Barriers: Knowledge    - arrange in-home help services - eat healthy to increase strength - know who to call for help if I fall    Why is this important?   Before the stroke you probably did not think much about being safe when you are up and about.  Now, it may be harder for you to get around.  It may also be easier for you to trip or fall.  It is common to have muscle weakness after a stroke. You may also feel like you cannot control an arm or leg.  It will be helpful to work with a physical therapist to get your strength and muscle control back.  It is good to stay as active as you can. Walking and stretching help you stay strong and flexible.  The physical therapist will develop an exercise program just for you.     Notes:   8/31 - Call placed to [name], notified that member has completed [name] PT and will need outpatient PT to continue advancement.  Member has had another fall since discharge, encouraged to use walker and/or cane for more stability.  Wife would like member to attend [name] PT, call placed, they do not have current order.  Call placed to PCP office to request order be placed.  Provided with [name] fax number [phone])  9/16 - Confirms he has started with outpatient PT.  Report progress in strength     [facility] - Make and Keep All Appointments   On track    Timeframe:  Short-Term Goal Priority:  High Start  Date:      8/4                       Expected End Date:        10/4               Barriers: Knowledge  Follow Up Date 04/18/2021    - ask family or friend for a ride - call to cancel if needed - keep a calendar with appointment dates    Why is this important?   Part of staying healthy is seeing the doctor for follow-up care.  If you forget your appointments, there are some things you can do to stay on track.    Notes:   8/31 - Member has attended PCP appointment since discharge, was to follow up yesterday but office had to reschedule for next month.  Will follow up with ID today for assessment of infection  9/15 - Has had appointment with ID, no urgent issues noted.  Will see vascular on 9/28, HF  clinic on 10/25 and endocrinology on 10/27       Valente David, RN, MSN [place] Manager [phone]

## 2021-05-12 ENCOUNTER — Other Ambulatory Visit: Payer: Self-pay

## 2021-05-12 ENCOUNTER — Ambulatory Visit: Payer: Medicare Other

## 2021-05-12 DIAGNOSIS — R41841 Cognitive communication deficit: Secondary | ICD-10-CM

## 2021-05-12 NOTE — Patient Instructions (Signed)
Strategies for Improving Your Attention and Memory  Use good eye-contact  Give the speaker your undivided attention  Look directly at the speaker  Complete one task at a time  Avoid multitasking  Complete one task before starting a new one  Write a note to yourself if you think of something else that needs to be done  Let others know when you need quiet time and can't be interrupted  Don't answer the phone, texts, or emails while you are working on another task  Put aside distracting thoughts  If you find your mind wandering, refocus your attention on the speaker  Avoid off-topic comments or responses that may divert your attention   If something important comes to mind, let the speaker know and pause to write yourself a note: "Do you mind holding on a minute, I have to write something down."  Put thoughts on hold and focus on salient information  Limit distractions in your environment  Think about the environment around you  Limit background noise by turning off the TV or music, putting your phone away  Close the door and work in quiet  Use active listening  Actively participate in the conversation to stay focused  Paraphrase what you have heard to include the most important details  Adding some associations may help you remember  Ask questions to clarify certain points  Summarize the speaker's comments periodically  Avoid nodding your head and using "mhm" responses as these are more passive and don't help your attention  Alert the other person/people  It may be helpful to alert your listener to the fact that you may need reminders to keep on track  Tell the speaker in advance that you may need to stop them and have them repeat salient information  If you lose focus, interject and let the person know, "I'm sorry, I lost you, can you tell me again?"  Write down information  Write down pertinent information as it comes up, such as telephone numbers, names  of people, addresses, details from appointments and conversations, etc.  

## 2021-05-12 NOTE — Therapy (Signed)
[facility] [address] [place], [place], [address] Phone: [phone]   Fax:  [phone]  Speech Language Pathology Treatment  Patient Details  Name: [name] MRN: [id number] Date of Birth: 08/09/1946 Referring Provider (SLP): Fenton Foy, NP   Encounter Date: 05/12/2021   End of Session - 05/12/21 1310     Visit Number 6    Number of Visits 17    Date for SLP Re-Evaluation 05/27/21    SLP Start Time 53    SLP Stop Time  7169    SLP Time Calculation (min) 47 min    Activity Tolerance Patient tolerated treatment well             Past Medical History:  Diagnosis Date   Allergic rhinitis    Anxiety    Atrial fibrillation ([place])    Cancer ([place])    hx of skin cancers    Chronic kidney disease    Coronary artery disease    a. s/p MI & CABG; b. s/p PCI Diag;  c. Cath 12/2011: LAD diff dzs/small, Diag 75% isr, 90 dist to stent (small), LCX & RCA occluded, VG->OM 40, VG->PDA patent - med rx.   COVID-19    x 3   Depression    PTSD   Diabetes mellitus    not on medications   Diastolic CHF, chronic ([place])    a. EF 55-60% by echo 2007   GERD (gastroesophageal reflux disease)    "not anymore" " I had a lap band"   History of diverticulitis of colon    5 YRS AGO   History of pneumonia    2017   History of transient ischemic attack (TIA)    CAROTID DOPPLERS NOV 2011  0-39& BIL. STENOSIS   Hyperlipidemia    Hypertension    Kidney failure    Mitral regurgitation    Myocardial infarction [facility])    Neuromuscular disorder (HCC)    left arm numbness    OA (osteoarthritis)    RIGHT KNEE ARTHOFIBROSIS W/ PAIN  (S/P  REPLACEMENT 2004)   PTSD (post-traumatic stress disorder)    from [place] since 1973   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/25/2016   Complex valvuloplasty including artificial Gore-tex neochord placement x4, plication of anterior commissure, and 26 mm Sorin Memo 3D ring  annuloplasty via right mini thoracotomy approach   Shortness of breath dyspnea    with exertion   Skin cancer    Sleep apnea    cpap- see ov note in EPIC 05/12/11 for settings    Stroke [facility])    hx of   Stroke [facility])     Past Surgical History:  Procedure Laterality Date   AV FISTULA PLACEMENT Left 08/02/2018   Procedure: ARTERIOVENOUS (AV) FISTULA CREATION RADIOCEPHALIC;  Surgeon: Angelia Mould, MD;  Location: [facility];  Service: Vascular;  Laterality: Left;   BLEPHAROPLASTY  [place]  2001, 2004, 2009, 2011   CARDIAC CATHETERIZATION N/A 01/23/2016   Procedure: Right/Left Heart Cath and Coronary Angiography;  Surgeon: [name], MD;  Location: [place] CV LAB;  Service: Cardiovascular;  Laterality: N/A;   CARDIOVERSION N/A 09/07/2017   Procedure: CARDIOVERSION;  Surgeon: [name], MD;  Location: [facility] ENDOSCOPY;  Service: Cardiovascular;  Laterality: N/A;   CARDIOVERSION N/A 09/13/2019   Procedure: CARDIOVERSION;  Surgeon: [name], MD;  Location: [place];  Service: Cardiovascular;  Laterality: N/A;  CARDIOVERSION N/A 10/19/2019   Procedure: CARDIOVERSION;  Surgeon: [name], MD;  Location: [facility] ENDOSCOPY;  Service: Cardiovascular;  Laterality: N/A;   CATARACT EXTRACTION W/ INTRAOCULAR LENS IMPLANT Bilateral    CHONDROPLASTY  07/22/2011   Procedure: CHONDROPLASTY;  Surgeon: Dione Plover Aluisio;  Location: [place];  Service: Orthopedics;;   CIRCUMCISION  [place]-- POST CABG   W/ STENT, last cath 01/07/2012    CORONARY ARTERY BYPASS GRAFT  1995   X3 VESSEL   CORONARY ARTERY BYPASS GRAFT  1995   CORONARY STENT PLACEMENT     CYSTOSCOPY/URETEROSCOPY/HOLMIUM LASER/STENT PLACEMENT Right 01/08/2021   Procedure: CYSTOSCOPY/RETROGRADE/URETEROSCOPY/HOLMIUM LASER/STENT PLACEMENT;  Surgeon: Franchot Gallo, MD;  Location: [facility];  Service: Urology;  Laterality:  Right;   IR PERC TUN PERIT CATH WO PORT S&I /IMAG  03/06/2021   IR REMOVAL TUN CV CATH W/O FL  04/14/2021   KNEE ARTHROSCOPY  07/22/2011   Procedure: ARTHROSCOPY KNEE;  Surgeon: Dione Plover Aluisio;  Location: [place];  Service: Orthopedics;  Laterality: Right;  WITH DEBRIDEMENT    KNEE ARTHROSCOPY W/ MENISCECTOMY  X2 IN 2002-- RIGHT KNEE   LAPAROSCOPIC GASTRIC BANDING  01-15-09   LEFT HEART CATH AND CORONARY ANGIOGRAPHY N/A 04/29/2017   Procedure: LEFT HEART CATH AND CORONARY ANGIOGRAPHY;  Surgeon: [name], MD;  Location: [place] CV LAB;  Service: Cardiovascular;  Laterality: N/A;   LEFT HEART CATHETERIZATION WITH CORONARY ANGIOGRAM N/A 09/11/2013   Procedure: LEFT HEART CATHETERIZATION WITH CORONARY ANGIOGRAM;  Surgeon: [name], MD;  Location: [facility] CATH LAB;  Service: Cardiovascular;  Laterality: N/A;   MITRAL VALVE REPAIR Right 03/25/2016   Procedure: MINIMALLY INVASIVE REOPERATION FOR MITRAL VALVE REPAIR  (MVR) with size 26 Sorin Memo 3D;  Surgeon: [name], MD;  Location: [place];  Service: Open Heart Surgery;  Laterality: Right;   MITRAL VALVE REPAIR  03/2016   RIGHT KNEE ED COMPARTMENT REPLACEMENT  2004   SYNOVECTOMY  07/22/2011   Procedure: SYNOVECTOMY;  Surgeon: Dione Plover Aluisio;  Location: [place];  Service: Orthopedics;;   TEE WITHOUT CARDIOVERSION N/A 01/23/2016   Procedure: TRANSESOPHAGEAL ECHOCARDIOGRAM (TEE);  Surgeon: [name], MD;  Location: [place];  Service: Cardiovascular;  Laterality: N/A;   TEE WITHOUT CARDIOVERSION N/A 03/25/2016   Procedure: TRANSESOPHAGEAL ECHOCARDIOGRAM (TEE);  Surgeon: [name], MD;  Location: [place];  Service: Open Heart Surgery;  Laterality: N/A;   TEE WITHOUT CARDIOVERSION N/A 03/04/2021   Procedure: TRANSESOPHAGEAL ECHOCARDIOGRAM (TEE);  Surgeon: Lelon Perla, MD;  Location: [facility] ENDOSCOPY;  Service: Cardiovascular;  Laterality: N/A;   UMBILICAL HERNIA REPAIR  01-15-09   W/  GASTRIC BANDING PROCEDURE    There were no vitals filed for this visit.   Subjective Assessment - 05/12/21 1314     Subjective "I had another fall"    Currently in Pain? No/denies                   ADULT SLP TREATMENT - 05/12/21 1310       General Information   Behavior/Cognition Alert;Cooperative;Pleasant mood      Treatment Provided   Treatment provided Cognitive-Linquistic      Cognitive-Linquistic Treatment   Treatment focused on Cognition;Patient/family/caregiver education    Skilled Treatment Another fall reported over weekend, which pt declined hitting head. Pt is currently working with PT at another facility per patient report. Pt reports  he did not complete HEP. Pt did return with ST folder containing previous ST asssignments. SLP reviewed folder with only 2 assignments initiated. Pt reports he has been attempting to complete them, but he is "easily distracted." Overt reduced attention exhibited on hwk as task usually not completed in entirety. SLP provided education and instruction re: attention compensations to aid focus. Pt verbalized understanding of education.  Pt was verbose re: family matters this session, in which SLP educated patient on the impact of internal distractions on cognitive functioning.      Assessment / Recommendations / Plan   Plan Continue with current plan of care      Progression Toward Goals   Progression toward goals Progressing toward goals              SLP Education - 05/12/21 1346     Education Details double check, attention to detail, motivation to change, finish HEP    Person(s) Educated Patient    Methods Explanation;Demonstration;Verbal cues    Comprehension Verbalized understanding;Returned demonstration;Verbal cues required              SLP Short Term Goals - 05/05/21 1304       SLP SHORT TERM GOAL #1   Title Pt will use memory compensations for appointments, medicine management, and other daily activities  with occasional min A over 2 sessions    Baseline 04-14-21, 05-05-21    Status Achieved    Target Date 05/02/21      SLP SHORT TERM GOAL #2   Title Pt will demonstrate selective attention to auditory/written instructions for completion of simple-mod complex task given 2 or less repetitions over 2 sessions    Baseline 04-14-21, 05-05-21    Status Achieved    Target Date 05/02/21      SLP SHORT TERM GOAL #3   Title Pt will ID and correct errors on structured cognitive tasks with 80% accuracy given occasional min A over 2 sessions    Baseline 04-14-21, 05-05-21    Status Achieved    Target Date 05/02/21      SLP SHORT TERM GOAL #4   Title Pt will complete simple to mod complex executive functioning tasks with 80% accuracy given occasional min A over 2 sessions    Baseline 05-05-21    Status Partially Met    Target Date 05/02/21      SLP SHORT TERM GOAL #5   Title Pt/caregiver will complete cognitive PROM in first 1-2 sessions    Baseline Cognitive Function=56    Status Achieved    Target Date 05/02/21              SLP Long Term Goals - 05/12/21 1311       SLP LONG TERM GOAL #1   Title Pt will use memory compensations for appointments, medicine management, and other daily activities with rare min A over 2 sessions    Time 8    Period Weeks    Status On-going    Target Date 05/30/21      SLP LONG TERM GOAL #2   Title Pt will ID and correct errors on structured cognitive tasks with 90% accuracy given rare min A over 2 sessions    Time 8    Period Weeks    Status On-going    Target Date 05/30/21      SLP LONG TERM GOAL #3   Title Pt will complete simple to mod complex executive functioning tasks with 80% accuracy given rare min  A over 2 sessions    Time 8    Period Weeks    Status On-going    Target Date 05/30/21      SLP LONG TERM GOAL #4   Title Pt/caregiver will report less frustration and report improved cognitive functioning via PROM by 2 past at last ST session     Time 8    Period Weeks    Status On-going    Target Date 05/30/21              Plan - 05/12/21 1311     Clinical Impression Statement [name] presents to OPST intervention to address cognitive changes s/p contraction of COVID-19. SLP educated and instructed recommended attention strategies and impact of internal/external distractions on cognitive functioning. Very minimal HEP completed at this time, in which SLP recommended taking breaks and factoring HEP into routine to aid completion. Pt continues to require occasional min A to ID errors on structured tasks. Skilled ST is warranted to address cognitive communication skills to improve functional independence and safety as well as reduce caregiver burden and frustration related to cognitive impairment.    Speech Therapy Frequency 2x / week    Duration 8 weeks    Treatment/Interventions Compensatory strategies;Cueing hierarchy;Functional tasks;Patient/family education;Cognitive reorganization;Compensatory techniques;Internal/external aids;SLP instruction and feedback;Environmental controls    Potential to Achieve Goals Fair    Potential Considerations Ability to learn/carryover information;Cooperation/participation level    SLP Home Exercise Plan provided    Consulted and Agree with Plan of Care Patient             Patient will benefit from skilled therapeutic intervention in order to improve the following deficits and impairments:   Cognitive communication deficit    Problem List Patient Active Problem List   Diagnosis Date Noted   Enterococcal bacteremia    History of stroke    Prosthetic valve endocarditis ([place])    Bacteremia due to Gram-positive bacteria 02/27/2021   Anemia 02/26/2021   Weakness generalized 02/26/2021   CKD (chronic kidney disease) stage 4, GFR 15-29 ml/min ([place]) 02/26/2021   Atrial fibrillation, chronic (HCC) 02/26/2021   Chronic anticoagulation 02/26/2021   Prolonged QT interval 02/26/2021   Depressive  disorder 02/25/2021   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[place]) 02/25/2021   Squamous cell carcinoma of skin of face 02/25/2021   Actinic keratosis 02/25/2021   Anxiety state 02/25/2021   Basal cell carcinoma of nose 02/25/2021   Basal cell carcinoma of skin of unspecified ear and external auricular canal 02/25/2021   Benign prostatic hyperplasia with lower urinary tract symptoms 02/25/2021   Bilateral pseudophakia 02/25/2021   Carcinoma in situ of skin of other parts of face 02/25/2021   Dental caries on smooth surface penetrating into dentin 02/25/2021   Deposits (accretions) on teeth 02/25/2021   Diverticulitis of colon 02/25/2021   Encounter for fitting and adjustment of hearing aid 02/25/2021   Encounter for removal of sutures 02/25/2021   Esophageal reflux 02/25/2021   Generalized osteoarthrosis, involving multiple sites 02/25/2021   Gout, unspecified 02/25/2021   History of other malignant neoplasm of skin 02/25/2021   Male erectile dysfunction, unspecified 02/25/2021   Peptic ulcer 02/25/2021   Post-traumatic stress disorder, unspecified 02/25/2021   Psychosexual dysfunction with inhibited sexual excitement 02/25/2021   Sensorineural hearing loss, bilateral 02/25/2021   Unspecified atrial fibrillation ([place]) 02/25/2021   Protein-calorie malnutrition, severe 02/04/2021   Cerebral thrombosis with cerebral infarction 02/02/2021   Right sided weakness 02/01/2021   History of COVID-19 01/29/2021   Memory loss  01/29/2021   Olfactory impairment 01/29/2021   Gait disturbance 01/29/2021   Fatigue 01/29/2021   Physical deconditioning 01/29/2021   Pressure injury of skin 01/07/2021   AKI (acute kidney injury) ([place]) 01/06/2021   Generalized weakness 01/06/2021   ARF (acute renal failure) ([place]) 01/06/2021   Dyspnea 10/05/2020   CHF (congestive heart failure) ([place]) 10/04/2020   Carpal tunnel syndrome of right wrist 09/25/2020   Pain of left hand 09/25/2020   COVID-19 virus infection 05/24/2020    Hypertensive chronic kidney disease with stage 1 through stage 4 chronic kidney disease, or unspecified chronic kidney disease    Coronary artery disease    Type II diabetes mellitus ([place])    Hypertension    Back pain 04/08/2020   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/05/2019   Pain in right foot 03/22/2018   Cellulitis of left foot 03/01/2018   Pain of joint of left ankle and foot 09/06/2017   Cancer ([place])    Other specified counseling 04/03/2016   Pneumothorax on right 03/29/2016   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/25/2016   Atherosclerotic heart disease of native coronary artery without angina pectoris 03/10/2016   Heart failure ([place]) 03/10/2016   Cataract extraction status of eye 03/10/2016   Other long term (current) drug therapy 03/10/2016   Osteoarthritis of knee 03/10/2016   Vitamin D deficiency 03/10/2016   Mitral regurgitation    Avitaminosis D 12/25/2015   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   Arthritis of knee, degenerative 12/25/2015   H/O cataract extraction 12/25/2015   Atherosclerotic heart disease of native coronary artery with other forms of angina pectoris ([place]) 12/25/2015   Congestive heart failure ([place]) 12/25/2015   Pure hypercholesterolemia 12/25/2015   Allergic rhinitis 12/25/2015   Essential (primary) hypertension 12/25/2015   Hypotension 01/29/2014   Expected Blood loss, postoperative 04/29/2012   Pain due to unicompartmental arthroplasty of knee ([place]) 04/27/2012   Preoperative evaluation of a medical condition to rule out surgical contraindications (TAR required) 24/58/0998   Diastolic CHF, chronic ([place]) 06/29/2011   Preoperative evaluation to rule out surgical contraindication 06/29/2011   CAROTID ARTERY STENOSIS 07/09/2010   TIA 05/29/2010   FATIGUE / MALAISE 05/29/2010   CAD, AUTOLOGOUS BYPASS GRAFT 11/15/2008   MYOCARDIAL INFARCTION 12/18/2007   ALLERGIC RHINITIS WITH CONJUNCTIVITIS 12/18/2007   Type 2 diabetes mellitus without complications  ([place]) 33/82/5053   Other and unspecified hyperlipidemia 12/05/2007   Other ill-defined and unknown causes of morbidity and mortality 12/05/2007   Essential hypertension 12/05/2007   Coronary atherosclerosis 12/05/2007   DEGENERATIVE JOINT DISEASE 12/05/2007   Obstructive sleep apnea of adult 12/05/2007    Alinda Deem, MA CCC-SLP 05/12/2021, 2:13 PM  [facility] [address] [place], [place], [address] Phone: [phone]   Fax:  [phone]   Name: [name] MRN: [id number] Date of Birth: 08/29/1945

## 2021-05-13 DIAGNOSIS — I69359 Hemiplegia and hemiparesis following cerebral infarction affecting unspecified side: Secondary | ICD-10-CM | POA: Diagnosis not present

## 2021-05-13 DIAGNOSIS — R296 Repeated falls: Secondary | ICD-10-CM | POA: Diagnosis not present

## 2021-05-13 DIAGNOSIS — R531 Weakness: Secondary | ICD-10-CM | POA: Diagnosis not present

## 2021-05-13 DIAGNOSIS — R262 Difficulty in walking, not elsewhere classified: Secondary | ICD-10-CM | POA: Diagnosis not present

## 2021-05-15 ENCOUNTER — Ambulatory Visit: Payer: Medicare Other

## 2021-05-15 ENCOUNTER — Other Ambulatory Visit: Payer: Self-pay

## 2021-05-15 DIAGNOSIS — R262 Difficulty in walking, not elsewhere classified: Secondary | ICD-10-CM | POA: Diagnosis not present

## 2021-05-15 DIAGNOSIS — R296 Repeated falls: Secondary | ICD-10-CM | POA: Diagnosis not present

## 2021-05-15 DIAGNOSIS — I69359 Hemiplegia and hemiparesis following cerebral infarction affecting unspecified side: Secondary | ICD-10-CM | POA: Diagnosis not present

## 2021-05-15 DIAGNOSIS — R41841 Cognitive communication deficit: Secondary | ICD-10-CM | POA: Diagnosis not present

## 2021-05-15 DIAGNOSIS — R531 Weakness: Secondary | ICD-10-CM | POA: Diagnosis not present

## 2021-05-15 NOTE — Therapy (Signed)
[facility] [address] [place], [place], [address] Phone: [phone]   Fax:  [phone]  Speech Language Pathology Treatment  Patient Details  Name: [name] MRN: [id number] Date of Birth: [birth year] Referring Provider (SLP): Fenton Foy, NP   Encounter Date: 05/15/2021   End of Session - 05/15/21 1336     Visit Number 7    Number of Visits 17    Date for SLP Re-Evaluation 05/27/21    SLP Start Time 1315    SLP Stop Time  1401    SLP Time Calculation (min) 46 min    Activity Tolerance Patient tolerated treatment well             Past Medical History:  Diagnosis Date   Allergic rhinitis    Anxiety    Atrial fibrillation ([place])    Cancer ([place])    hx of skin cancers    Chronic kidney disease    Coronary artery disease    a. s/p MI & CABG; b. s/p PCI Diag;  c. Cath 12/2011: LAD diff dzs/small, Diag 75% isr, 90 dist to stent (small), LCX & RCA occluded, VG->OM 40, VG->PDA patent - med rx.   COVID-19    x 3   Depression    PTSD   Diabetes mellitus    not on medications   Diastolic CHF, chronic ([place])    a. EF 55-60% by echo 2007   GERD (gastroesophageal reflux disease)    "not anymore" " I had a lap band"   History of diverticulitis of colon    5 YRS AGO   History of pneumonia    2017   History of transient ischemic attack (TIA)    CAROTID DOPPLERS NOV 2011  0-39& BIL. STENOSIS   Hyperlipidemia    Hypertension    Kidney failure    Mitral regurgitation    Myocardial infarction [facility])    Neuromuscular disorder (HCC)    left arm numbness    OA (osteoarthritis)    RIGHT KNEE ARTHOFIBROSIS W/ PAIN  (S/P  REPLACEMENT 2004)   PTSD (post-traumatic stress disorder)    from [place] since 1973   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/25/2016   Complex valvuloplasty including artificial Gore-tex neochord placement x4, plication of anterior commissure, and 26 mm Sorin Memo 3D ring  annuloplasty via right mini thoracotomy approach   Shortness of breath dyspnea    with exertion   Skin cancer    Sleep apnea    cpap- see ov note in EPIC 05/12/11 for settings    Stroke [facility])    hx of   Stroke [facility])     Past Surgical History:  Procedure Laterality Date   AV FISTULA PLACEMENT Left 08/02/2018   Procedure: ARTERIOVENOUS (AV) FISTULA CREATION RADIOCEPHALIC;  Surgeon: Angelia Mould, MD;  Location: [facility];  Service: Vascular;  Laterality: Left;   BLEPHAROPLASTY  [place]  2001, 2004, 2009, 2011   CARDIAC CATHETERIZATION N/A 01/23/2016   Procedure: Right/Left Heart Cath and Coronary Angiography;  Surgeon: [name], MD;  Location: [place] CV LAB;  Service: Cardiovascular;  Laterality: N/A;   CARDIOVERSION N/A 09/07/2017   Procedure: CARDIOVERSION;  Surgeon: [name], MD;  Location: [facility] ENDOSCOPY;  Service: Cardiovascular;  Laterality: N/A;   CARDIOVERSION N/A 09/13/2019   Procedure: CARDIOVERSION;  Surgeon: [name], MD;  Location: [facility];  Service: Cardiovascular;  Laterality: N/A;  CARDIOVERSION N/A 10/19/2019   Procedure: CARDIOVERSION;  Surgeon: [name], MD;  Location: [facility] ENDOSCOPY;  Service: Cardiovascular;  Laterality: N/A;   CATARACT EXTRACTION W/ INTRAOCULAR LENS IMPLANT Bilateral    CHONDROPLASTY  07/22/2011   Procedure: CHONDROPLASTY;  Surgeon: Dione Plover Aluisio;  Location: [facility];  Service: Orthopedics;;   CIRCUMCISION  [place]-- POST CABG   W/ STENT, last cath 01/07/2012    CORONARY ARTERY BYPASS GRAFT  1995   X3 VESSEL   CORONARY ARTERY BYPASS GRAFT  1995   CORONARY STENT PLACEMENT     CYSTOSCOPY/URETEROSCOPY/HOLMIUM LASER/STENT PLACEMENT Right 01/08/2021   Procedure: CYSTOSCOPY/RETROGRADE/URETEROSCOPY/HOLMIUM LASER/STENT PLACEMENT;  Surgeon: Franchot Gallo, MD;  Location: [facility];  Service: Urology;  Laterality:  Right;   IR PERC TUN PERIT CATH WO PORT S&I /IMAG  03/06/2021   IR REMOVAL TUN CV CATH W/O FL  04/14/2021   KNEE ARTHROSCOPY  07/22/2011   Procedure: ARTHROSCOPY KNEE;  Surgeon: Dione Plover Aluisio;  Location: [facility];  Service: Orthopedics;  Laterality: Right;  WITH DEBRIDEMENT    KNEE ARTHROSCOPY W/ MENISCECTOMY  X2 IN 2002-- RIGHT KNEE   LAPAROSCOPIC GASTRIC BANDING  01-15-09   LEFT HEART CATH AND CORONARY ANGIOGRAPHY N/A 04/29/2017   Procedure: LEFT HEART CATH AND CORONARY ANGIOGRAPHY;  Surgeon: [name], MD;  Location: [place] CV LAB;  Service: Cardiovascular;  Laterality: N/A;   LEFT HEART CATHETERIZATION WITH CORONARY ANGIOGRAM N/A 09/11/2013   Procedure: LEFT HEART CATHETERIZATION WITH CORONARY ANGIOGRAM;  Surgeon: [name], MD;  Location: [facility] CATH LAB;  Service: Cardiovascular;  Laterality: N/A;   MITRAL VALVE REPAIR Right 03/25/2016   Procedure: MINIMALLY INVASIVE REOPERATION FOR MITRAL VALVE REPAIR  (MVR) with size 26 Sorin Memo 3D;  Surgeon: [name], MD;  Location: [facility] [place];  Service: Open Heart Surgery;  Laterality: Right;   MITRAL VALVE REPAIR  03/2016   RIGHT KNEE ED COMPARTMENT REPLACEMENT  2004   SYNOVECTOMY  07/22/2011   Procedure: SYNOVECTOMY;  Surgeon: Dione Plover Aluisio;  Location: [facility];  Service: Orthopedics;;   TEE WITHOUT CARDIOVERSION N/A 01/23/2016   Procedure: TRANSESOPHAGEAL ECHOCARDIOGRAM (TEE);  Surgeon: [name], MD;  Location: [place];  Service: Cardiovascular;  Laterality: N/A;   TEE WITHOUT CARDIOVERSION N/A 03/25/2016   Procedure: TRANSESOPHAGEAL ECHOCARDIOGRAM (TEE);  Surgeon: [name], MD;  Location: [facility];  Service: Open Heart Surgery;  Laterality: N/A;   TEE WITHOUT CARDIOVERSION N/A 03/04/2021   Procedure: TRANSESOPHAGEAL ECHOCARDIOGRAM (TEE);  Surgeon: Lelon Perla, MD;  Location[facility] ENDOSCOPY;  Service: Cardiovascular;  Laterality: N/A;   UMBILICAL HERNIA REPAIR  01-15-09   W/  GASTRIC BANDING PROCEDURE    There were no vitals filed for this visit.   Subjective Assessment - 05/15/21 1339     Subjective "no falls" since last ST session    Currently in Pain? No/denies                   ADULT SLP TREATMENT - 05/15/21 1315       General Information   Behavior/Cognition Alert;Cooperative;Pleasant mood      Treatment Provided   Treatment provided Cognitive-Linquistic      Cognitive-Linquistic Treatment   Treatment focused on Cognition;Patient/family/caregiver education    Skilled Treatment Pt reports no recent falls since last ST session and pt returned with HEP completed. SLP reviewed sequencing task targeting attention to  detail and error awareness, with intermittent min to mod verbal and visual cues required to ID errors. Usual extended time required to correct errors given occasional min to mod A. Pt noted to independently implement two strategies to aid re-completion of task, which was occasionally effective due to poor error awareness.      Assessment / Recommendations / Plan   Plan Continue with current plan of care      Progression Toward Goals   Progression toward goals Progressing toward goals              SLP Education - 05/15/21 1412     Education Details double check, attention to details, systematic strategies    Person(s) Educated Patient    Methods Explanation;Demonstration;Verbal cues    Comprehension Verbalized understanding;Returned demonstration;Need further instruction;Verbal cues required              SLP Short Term Goals - 05/05/21 1304       SLP SHORT TERM GOAL #1   Title Pt will use memory compensations for appointments, medicine management, and other daily activities with occasional min A over 2 sessions    Baseline 04-14-21, 05-05-21    Status Achieved    Target Date 05/02/21      SLP SHORT TERM GOAL #2   Title Pt will demonstrate selective attention to auditory/written instructions for completion of  simple-mod complex task given 2 or less repetitions over 2 sessions    Baseline 04-14-21, 05-05-21    Status Achieved    Target Date 05/02/21      SLP SHORT TERM GOAL #3   Title Pt will ID and correct errors on structured cognitive tasks with 80% accuracy given occasional min A over 2 sessions    Baseline 04-14-21, 05-05-21    Status Achieved    Target Date 05/02/21      SLP SHORT TERM GOAL #4   Title Pt will complete simple to mod complex executive functioning tasks with 80% accuracy given occasional min A over 2 sessions    Baseline 05-05-21    Status Partially Met    Target Date 05/02/21      SLP SHORT TERM GOAL #5   Title Pt/caregiver will complete cognitive PROM in first 1-2 sessions    Baseline Cognitive Function=56    Status Achieved    Target Date 05/02/21              SLP Long Term Goals - 05/15/21 1338       SLP LONG TERM GOAL #1   Title Pt will use memory compensations for appointments, medicine management, and other daily activities with rare min A over 2 sessions    Baseline 05-15-21    Time 8    Period Weeks    Status On-going    Target Date 05/30/21      SLP LONG TERM GOAL #2   Title Pt will ID and correct errors on structured cognitive tasks with 90% accuracy given rare min A over 2 sessions    Time 8    Period Weeks    Status On-going    Target Date 05/30/21      SLP LONG TERM GOAL #3   Title Pt will complete simple to mod complex executive functioning tasks with 80% accuracy given rare min A over 2 sessions    Time 8    Period Weeks    Status On-going    Target Date 05/30/21      SLP LONG TERM GOAL #  4   Title Pt/caregiver will report less frustration and report improved cognitive functioning via PROM by 2 past at last ST session    Time 8    Period Weeks    Status On-going    Target Date 05/30/21              Plan - 05/15/21 1336     Clinical Impression Statement Aldahir presents to OPST intervention to address cognitive changes s/p  contraction of COVID-19. Pt completed HEP for return to ST; however, pt did not double check work resulting in usual errors. Occasional min to mod A required to ID and correct errors on single task. Skilled ST is warranted to address cognitive communication skills to improve functional independence and safety as well as reduce caregiver burden and frustration related to cognitive impairment.    Speech Therapy Frequency 2x / week    Duration 8 weeks    Treatment/Interventions Compensatory strategies;Cueing hierarchy;Functional tasks;Patient/family education;Cognitive reorganization;Compensatory techniques;Internal/external aids;SLP instruction and feedback;Environmental controls    Potential to Achieve Goals Fair    Potential Considerations Ability to learn/carryover information;Cooperation/participation level    SLP Home Exercise Plan provided    Consulted and Agree with Plan of Care Patient             Patient will benefit from skilled therapeutic intervention in order to improve the following deficits and impairments:   Cognitive communication deficit    Problem List Patient Active Problem List   Diagnosis Date Noted   Enterococcal bacteremia    History of stroke    Prosthetic valve endocarditis ([place])    Bacteremia due to Gram-positive bacteria 02/27/2021   Anemia 02/26/2021   Weakness generalized 02/26/2021   CKD (chronic kidney disease) stage 4, GFR 15-29 ml/min ([place]) 02/26/2021   Atrial fibrillation, chronic (HCC) 02/26/2021   Chronic anticoagulation 02/26/2021   Prolonged QT interval 02/26/2021   Depressive disorder 02/25/2021   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[place]) 02/25/2021   Squamous cell carcinoma of skin of face 02/25/2021   Actinic keratosis 02/25/2021   Anxiety state 02/25/2021   Basal cell carcinoma of nose 02/25/2021   Basal cell carcinoma of skin of unspecified ear and external auricular canal 02/25/2021   Benign prostatic hyperplasia with lower urinary tract symptoms  02/25/2021   Bilateral pseudophakia 02/25/2021   Carcinoma in situ of skin of other parts of face 02/25/2021   Dental caries on smooth surface penetrating into dentin 02/25/2021   Deposits (accretions) on teeth 02/25/2021   Diverticulitis of colon 02/25/2021   Encounter for fitting and adjustment of hearing aid 02/25/2021   Encounter for removal of sutures 02/25/2021   Esophageal reflux 02/25/2021   Generalized osteoarthrosis, involving multiple sites 02/25/2021   Gout, unspecified 02/25/2021   History of other malignant neoplasm of skin 02/25/2021   Male erectile dysfunction, unspecified 02/25/2021   Peptic ulcer 02/25/2021   Post-traumatic stress disorder, unspecified 02/25/2021   Psychosexual dysfunction with inhibited sexual excitement 02/25/2021   Sensorineural hearing loss, bilateral 02/25/2021   Unspecified atrial fibrillation ([place]) 02/25/2021   Protein-calorie malnutrition, severe 02/04/2021   Cerebral thrombosis with cerebral infarction 02/02/2021   Right sided weakness 02/01/2021   History of COVID-19 01/29/2021   Memory loss 01/29/2021   Olfactory impairment 01/29/2021   Gait disturbance 01/29/2021   Fatigue 01/29/2021   Physical deconditioning 01/29/2021   Pressure injury of skin 01/07/2021   AKI (acute kidney injury) ([place]) 01/06/2021   Generalized weakness 01/06/2021   ARF (acute renal failure) ([place]) 01/06/2021   Dyspnea  10/05/2020   CHF (congestive heart failure) ([place]) 10/04/2020   Carpal tunnel syndrome of right wrist 09/25/2020   Pain of left hand 09/25/2020   COVID-19 virus infection 05/24/2020   Hypertensive chronic kidney disease with stage 1 through stage 4 chronic kidney disease, or unspecified chronic kidney disease    Coronary artery disease    Type II diabetes mellitus ([place])    Hypertension    Back pain 04/08/2020   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/05/2019   Pain in right foot 03/22/2018   Cellulitis of left foot 03/01/2018   Pain of joint of left ankle  and foot 09/06/2017   Cancer ([place])    Other specified counseling 04/03/2016   Pneumothorax on right 03/29/2016   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/25/2016   Atherosclerotic heart disease of native coronary artery without angina pectoris 03/10/2016   Heart failure ([place]) 03/10/2016   Cataract extraction status of eye 03/10/2016   Other long term (current) drug therapy 03/10/2016   Osteoarthritis of knee 03/10/2016   Vitamin D deficiency 03/10/2016   Mitral regurgitation    Avitaminosis D 12/25/2015   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   Arthritis of knee, degenerative 12/25/2015   H/O cataract extraction 12/25/2015   Atherosclerotic heart disease of native coronary artery with other forms of angina pectoris ([place]) 12/25/2015   Congestive heart failure ([place]) 12/25/2015   Pure hypercholesterolemia 12/25/2015   Allergic rhinitis 12/25/2015   Essential (primary) hypertension 12/25/2015   Hypotension 01/29/2014   Expected Blood loss, postoperative 04/29/2012   Pain due to unicompartmental arthroplasty of knee ([place]) 04/27/2012   Preoperative evaluation of a medical condition to rule out surgical contraindications (TAR required) 94/50/3888   Diastolic CHF, chronic ([place]) 06/29/2011   Preoperative evaluation to rule out surgical contraindication 06/29/2011   CAROTID ARTERY STENOSIS 07/09/2010   TIA 05/29/2010   FATIGUE / MALAISE 05/29/2010   CAD, AUTOLOGOUS BYPASS GRAFT 11/15/2008   MYOCARDIAL INFARCTION 12/18/2007   ALLERGIC RHINITIS WITH CONJUNCTIVITIS 12/18/2007   Type 2 diabetes mellitus without complications ([place]) 28/00/3491   Other and unspecified hyperlipidemia 12/05/2007   Other ill-defined and unknown causes of morbidity and mortality 12/05/2007   Essential hypertension 12/05/2007   Coronary atherosclerosis 12/05/2007   DEGENERATIVE JOINT DISEASE 12/05/2007   Obstructive sleep apnea of adult 12/05/2007    Alinda Deem, MA CCC-SLP 05/15/2021, 2:15 PM  [facility] [address] [place], [place], [address] Phone: [phone]   Fax:  [phone]   Name: LATEEF JUNCAJ MRN: [id number] Date of Birth: 07/28/1946

## 2021-05-19 ENCOUNTER — Other Ambulatory Visit: Payer: Self-pay

## 2021-05-19 ENCOUNTER — Ambulatory Visit: Payer: Medicare Other

## 2021-05-19 DIAGNOSIS — R41841 Cognitive communication deficit: Secondary | ICD-10-CM | POA: Diagnosis not present

## 2021-05-19 NOTE — Patient Instructions (Addendum)
Finish your estimate by ST session   Know your limits: work for 15-20 minutes to aid your focus, limit distractions, take your time   Take break or a walk when your attention wanes

## 2021-05-19 NOTE — Therapy (Signed)
[facility] [address] [place], [place], [address] Phone: [phone]   Fax:  [phone]  Speech Language Pathology Treatment  Patient Details  Name: [name] MRN: [id number] Date of Birth: Nov 11, 1945 Referring Provider (SLP): Fenton Foy, NP   Encounter Date: 05/19/2021   End of Session - 05/19/21 1351     Visit Number 8    Number of Visits 17    Date for SLP Re-Evaluation 05/27/21    SLP Start Time 0076    SLP Stop Time  1400    SLP Time Calculation (min) 43 min    Activity Tolerance Patient tolerated treatment well             Past Medical History:  Diagnosis Date   Allergic rhinitis    Anxiety    Atrial fibrillation ([place])    Cancer ([place])    hx of skin cancers    Chronic kidney disease    Coronary artery disease    a. s/p MI & CABG; b. s/p PCI Diag;  c. Cath 12/2011: LAD diff dzs/small, Diag 75% isr, 90 dist to stent (small), LCX & RCA occluded, VG->OM 40, VG->PDA patent - med rx.   COVID-19    x 3   Depression    PTSD   Diabetes mellitus    not on medications   Diastolic CHF, chronic ([place])    a. EF 55-60% by echo 2007   GERD (gastroesophageal reflux disease)    "not anymore" " I had a lap band"   History of diverticulitis of colon    5 YRS AGO   History of pneumonia    2017   History of transient ischemic attack (TIA)    CAROTID DOPPLERS NOV 2011  0-39& BIL. STENOSIS   Hyperlipidemia    Hypertension    Kidney failure    Mitral regurgitation    Myocardial infarction [facility])    Neuromuscular disorder (HCC)    left arm numbness    OA (osteoarthritis)    RIGHT KNEE ARTHOFIBROSIS W/ PAIN  (S/P  REPLACEMENT 2004)   PTSD (post-traumatic stress disorder)    from [place] since 1973   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/25/2016   Complex valvuloplasty including artificial Gore-tex neochord placement x4, plication of anterior commissure, and 26 mm Sorin Memo 3D ring  annuloplasty via right mini thoracotomy approach   Shortness of breath dyspnea    with exertion   Skin cancer    Sleep apnea    cpap- see ov note in EPIC 05/12/11 for settings    Stroke [facility])    hx of   Stroke [facility])     Past Surgical History:  Procedure Laterality Date   AV FISTULA PLACEMENT Left 08/02/2018   Procedure: ARTERIOVENOUS (AV) FISTULA CREATION RADIOCEPHALIC;  Surgeon: Angelia Mould, MD;  Location: [facility];  Service: Vascular;  Laterality: Left;   BLEPHAROPLASTY  [place]  2001, 2004, 2009, 2011   CARDIAC CATHETERIZATION N/A 01/23/2016   Procedure: Right/Left Heart Cath and Coronary Angiography;  Surgeon: [name], MD;  Location: [place] CV LAB;  Service: Cardiovascular;  Laterality: N/A;   CARDIOVERSION N/A 09/07/2017   Procedure: CARDIOVERSION;  Surgeon: [name], MD;  Location: [facility] ENDOSCOPY;  Service: Cardiovascular;  Laterality: N/A;   CARDIOVERSION N/A 09/13/2019   Procedure: CARDIOVERSION;  Surgeon: [name], MD;  Location: [place];  Service: Cardiovascular;  Laterality: N/A;  CARDIOVERSION N/A 10/19/2019   Procedure: CARDIOVERSION;  Surgeon: [name], MD;  Location: [facility] ENDOSCOPY;  Service: Cardiovascular;  Laterality: N/A;   CATARACT EXTRACTION W/ INTRAOCULAR LENS IMPLANT Bilateral    CHONDROPLASTY  07/22/2011   Procedure: CHONDROPLASTY;  Surgeon: Dione Plover Aluisio;  Location: [place];  Service: Orthopedics;;   CIRCUMCISION  [place]-- POST CABG   W/ STENT, last cath 01/07/2012    CORONARY ARTERY BYPASS GRAFT  1995   X3 VESSEL   CORONARY ARTERY BYPASS GRAFT  1995   CORONARY STENT PLACEMENT     CYSTOSCOPY/URETEROSCOPY/HOLMIUM LASER/STENT PLACEMENT Right 01/08/2021   Procedure: CYSTOSCOPY/RETROGRADE/URETEROSCOPY/HOLMIUM LASER/STENT PLACEMENT;  Surgeon: Franchot Gallo, MD;  Location: [facility];  Service: Urology;  Laterality:  Right;   IR PERC TUN PERIT CATH WO PORT S&I /IMAG  03/06/2021   IR REMOVAL TUN CV CATH W/O FL  04/14/2021   KNEE ARTHROSCOPY  07/22/2011   Procedure: ARTHROSCOPY KNEE;  Surgeon: Dione Plover Aluisio;  Location: [facility];  Service: Orthopedics;  Laterality: Right;  WITH DEBRIDEMENT    KNEE ARTHROSCOPY W/ MENISCECTOMY  X2 IN 2002-- RIGHT KNEE   LAPAROSCOPIC GASTRIC BANDING  01-15-09   LEFT HEART CATH AND CORONARY ANGIOGRAPHY N/A 04/29/2017   Procedure: LEFT HEART CATH AND CORONARY ANGIOGRAPHY;  Surgeon: [name], MD;  Location: [place] CV LAB;  Service: Cardiovascular;  Laterality: N/A;   LEFT HEART CATHETERIZATION WITH CORONARY ANGIOGRAM N/A 09/11/2013   Procedure: LEFT HEART CATHETERIZATION WITH CORONARY ANGIOGRAM;  Surgeon: [name], MD;  Location: [facility] CATH LAB;  Service: Cardiovascular;  Laterality: N/A;   MITRAL VALVE REPAIR Right 03/25/2016   Procedure: MINIMALLY INVASIVE REOPERATION FOR MITRAL VALVE REPAIR  (MVR) with size 26 Sorin Memo 3D;  Surgeon: [name], MD;  Location: [place];  Service: Open Heart Surgery;  Laterality: Right;   MITRAL VALVE REPAIR  03/2016   RIGHT KNEE ED COMPARTMENT REPLACEMENT  2004   SYNOVECTOMY  07/22/2011   Procedure: SYNOVECTOMY;  Surgeon: Dione Plover Aluisio;  Location: [place];  Service: Orthopedics;;   TEE WITHOUT CARDIOVERSION N/A 01/23/2016   Procedure: TRANSESOPHAGEAL ECHOCARDIOGRAM (TEE);  Surgeon: [name], MD;  Location: [place];  Service: Cardiovascular;  Laterality: N/A;   TEE WITHOUT CARDIOVERSION N/A 03/25/2016   Procedure: TRANSESOPHAGEAL ECHOCARDIOGRAM (TEE);  Surgeon: [name], MD;  Location: [place];  Service: Open Heart Surgery;  Laterality: N/A;   TEE WITHOUT CARDIOVERSION N/A 03/04/2021   Procedure: TRANSESOPHAGEAL ECHOCARDIOGRAM (TEE);  Surgeon: Lelon Perla, MD;  Location: [facility] ENDOSCOPY;  Service: Cardiovascular;  Laterality: N/A;   UMBILICAL HERNIA REPAIR  01-15-09   W/  GASTRIC BANDING PROCEDURE    There were no vitals filed for this visit.   Subjective Assessment - 05/19/21 1318     Subjective "the usual"    Currently in Pain? Yes    Pain Score 4     Pain Location Leg    Pain Orientation Right                   ADULT SLP TREATMENT - 05/19/21 1328       General Information   Behavior/Cognition Alert;Cooperative;Pleasant mood      Treatment Provided   Treatment provided Cognitive-Linquistic      Cognitive-Linquistic Treatment   Treatment focused on Cognition;Patient/family/caregiver education    Skilled Treatment No falls reported since last ST session.  Pt returned with HEP "double checked" per SLP request. SLP reviewed HEP with rare intermittent errors noted. Pt able to correct the errors with occasional additional time and rare verbal cues. Pt indicated he had been working on estimate for work, in which pt needed to frequently revise due to errors and becoming distractions. Pt implemented SLP recommendations to reduce background distractions, which was effective. SLP recommended working for 15-20 minutes or as long as patient can efffectively focus then take a break.      Assessment / Recommendations / Plan   Plan Continue with current plan of care      Progression Toward Goals   Progression toward goals Progressing toward goals              SLP Education - 05/19/21 1351     Education Details double check, work for short periods of time then take a break    Person(s) Educated Patient    Methods Explanation;Demonstration;Verbal cues    Comprehension Verbalized understanding;Returned demonstration;Verbal cues required              SLP Short Term Goals - 05/05/21 1304       SLP SHORT TERM GOAL #1   Title Pt will use memory compensations for appointments, medicine management, and other daily activities with occasional min A over 2 sessions    Baseline 04-14-21, 05-05-21    Status Achieved    Target Date 05/02/21       SLP SHORT TERM GOAL #2   Title Pt will demonstrate selective attention to auditory/written instructions for completion of simple-mod complex task given 2 or less repetitions over 2 sessions    Baseline 04-14-21, 05-05-21    Status Achieved    Target Date 05/02/21      SLP SHORT TERM GOAL #3   Title Pt will ID and correct errors on structured cognitive tasks with 80% accuracy given occasional min A over 2 sessions    Baseline 04-14-21, 05-05-21    Status Achieved    Target Date 05/02/21      SLP SHORT TERM GOAL #4   Title Pt will complete simple to mod complex executive functioning tasks with 80% accuracy given occasional min A over 2 sessions    Baseline 05-05-21    Status Partially Met    Target Date 05/02/21      SLP SHORT TERM GOAL #5   Title Pt/caregiver will complete cognitive PROM in first 1-2 sessions    Baseline Cognitive Function=56    Status Achieved    Target Date 05/02/21              SLP Long Term Goals - 05/19/21 1352       SLP LONG TERM GOAL #1   Title Pt will use memory compensations for appointments, medicine management, and other daily activities with rare min A over 2 sessions    Baseline 05-15-21    Time 8    Period Weeks    Status On-going    Target Date 05/30/21      SLP LONG TERM GOAL #2   Title Pt will ID and correct errors on structured cognitive tasks with 90% accuracy given rare min A over 2 sessions    Baseline 05-19-21    Time 8    Period Weeks    Status On-going    Target Date 05/30/21      SLP LONG TERM GOAL #3   Title Pt will complete simple to mod complex executive functioning tasks  with 80% accuracy given rare min A over 2 sessions    Baseline 05-19-21    Time 8    Period Weeks    Status On-going    Target Date 05/30/21      SLP LONG TERM GOAL #4   Title Pt/caregiver will report less frustration and report improved cognitive functioning via PROM by 2 past at last ST session    Time 8    Period Weeks    Status On-going    Target  Date 05/30/21              Plan - 05/19/21 1351     Clinical Impression Statement Zakarie presents to OPST intervention to address cognitive changes s/p contraction of COVID-19. Pt completed HEP for return to ST which was doubled check according to patient. Rare intermittent errors noted, which pt able to correct with rare min A and extra time. SLP provided recommendations to aid attention and error awareness for functional tasks at home. Skilled ST is warranted to address cognitive communication skills to improve functional independence and safety as well as reduce caregiver burden and frustration related to cognitive impairment.    Speech Therapy Frequency 2x / week    Duration 8 weeks    Treatment/Interventions Compensatory strategies;Cueing hierarchy;Functional tasks;Patient/family education;Cognitive reorganization;Compensatory techniques;Internal/external aids;SLP instruction and feedback;Environmental controls    Potential to Achieve Goals Fair    Potential Considerations Ability to learn/carryover information;Cooperation/participation level    SLP Home Exercise Plan provided    Consulted and Agree with Plan of Care Patient             Patient will benefit from skilled therapeutic intervention in order to improve the following deficits and impairments:   Cognitive communication deficit    Problem List Patient Active Problem List   Diagnosis Date Noted   Enterococcal bacteremia    History of stroke    Prosthetic valve endocarditis ([place])    Bacteremia due to Gram-positive bacteria 02/27/2021   Anemia 02/26/2021   Weakness generalized 02/26/2021   CKD (chronic kidney disease) stage 4, GFR 15-29 ml/min ([place]) 02/26/2021   Atrial fibrillation, chronic (HCC) 02/26/2021   Chronic anticoagulation 02/26/2021   Prolonged QT interval 02/26/2021   Depressive disorder 02/25/2021   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[place]) 02/25/2021   Squamous cell carcinoma of skin of face 02/25/2021   Actinic  keratosis 02/25/2021   Anxiety state 02/25/2021   Basal cell carcinoma of nose 02/25/2021   Basal cell carcinoma of skin of unspecified ear and external auricular canal 02/25/2021   Benign prostatic hyperplasia with lower urinary tract symptoms 02/25/2021   Bilateral pseudophakia 02/25/2021   Carcinoma in situ of skin of other parts of face 02/25/2021   Dental caries on smooth surface penetrating into dentin 02/25/2021   Deposits (accretions) on teeth 02/25/2021   Diverticulitis of colon 02/25/2021   Encounter for fitting and adjustment of hearing aid 02/25/2021   Encounter for removal of sutures 02/25/2021   Esophageal reflux 02/25/2021   Generalized osteoarthrosis, involving multiple sites 02/25/2021   Gout, unspecified 02/25/2021   History of other malignant neoplasm of skin 02/25/2021   Male erectile dysfunction, unspecified 02/25/2021   Peptic ulcer 02/25/2021   Post-traumatic stress disorder, unspecified 02/25/2021   Psychosexual dysfunction with inhibited sexual excitement 02/25/2021   Sensorineural hearing loss, bilateral 02/25/2021   Unspecified atrial fibrillation ([place]) 02/25/2021   Protein-calorie malnutrition, severe 02/04/2021   Cerebral thrombosis with cerebral infarction 02/02/2021   Right sided weakness 02/01/2021   History of  COVID-19 01/29/2021   Memory loss 01/29/2021   Olfactory impairment 01/29/2021   Gait disturbance 01/29/2021   Fatigue 01/29/2021   Physical deconditioning 01/29/2021   Pressure injury of skin 01/07/2021   AKI (acute kidney injury) ([place]) 01/06/2021   Generalized weakness 01/06/2021   ARF (acute renal failure) ([place]) 01/06/2021   Dyspnea 10/05/2020   CHF (congestive heart failure) ([place]) 10/04/2020   Carpal tunnel syndrome of right wrist 09/25/2020   Pain of left hand 09/25/2020   COVID-19 virus infection 05/24/2020   Hypertensive chronic kidney disease with stage 1 through stage 4 chronic kidney disease, or unspecified chronic kidney  disease    Coronary artery disease    Type II diabetes mellitus ([place])    Hypertension    Back pain 04/08/2020   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/05/2019   Pain in right foot 03/22/2018   Cellulitis of left foot 03/01/2018   Pain of joint of left ankle and foot 09/06/2017   Cancer ([place])    Other specified counseling 04/03/2016   Pneumothorax on right 03/29/2016   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/25/2016   Atherosclerotic heart disease of native coronary artery without angina pectoris 03/10/2016   Heart failure ([place]) 03/10/2016   Cataract extraction status of eye 03/10/2016   Other long term (current) drug therapy 03/10/2016   Osteoarthritis of knee 03/10/2016   Vitamin D deficiency 03/10/2016   Mitral regurgitation    Avitaminosis D 12/25/2015   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   Arthritis of knee, degenerative 12/25/2015   H/O cataract extraction 12/25/2015   Atherosclerotic heart disease of native coronary artery with other forms of angina pectoris ([place]) 12/25/2015   Congestive heart failure ([place]) 12/25/2015   Pure hypercholesterolemia 12/25/2015   Allergic rhinitis 12/25/2015   Essential (primary) hypertension 12/25/2015   Hypotension 01/29/2014   Expected Blood loss, postoperative 04/29/2012   Pain due to unicompartmental arthroplasty of knee ([place]) 04/27/2012   Preoperative evaluation of a medical condition to rule out surgical contraindications (TAR required) 76/54/6503   Diastolic CHF, chronic ([place]) 06/29/2011   Preoperative evaluation to rule out surgical contraindication 06/29/2011   CAROTID ARTERY STENOSIS 07/09/2010   TIA 05/29/2010   FATIGUE / MALAISE 05/29/2010   CAD, AUTOLOGOUS BYPASS GRAFT 11/15/2008   MYOCARDIAL INFARCTION 12/18/2007   ALLERGIC RHINITIS WITH CONJUNCTIVITIS 12/18/2007   Type 2 diabetes mellitus without complications ([place]) 54/65/6812   Other and unspecified hyperlipidemia 12/05/2007   Other ill-defined and unknown causes of morbidity  and mortality 12/05/2007   Essential hypertension 12/05/2007   Coronary atherosclerosis 12/05/2007   DEGENERATIVE JOINT DISEASE 12/05/2007   Obstructive sleep apnea of adult 12/05/2007    Alinda Deem, MA CCC-SLP 05/19/2021, 2:10 PM  [facility] [address] [place], [place], [address] Phone: [phone]   Fax:  [phone]   Name: THIERNO HUN MRN: [id number] Date of Birth: 23-Feb-1946

## 2021-05-20 ENCOUNTER — Ambulatory Visit: Payer: Medicare Other

## 2021-05-20 DIAGNOSIS — M21371 Foot drop, right foot: Secondary | ICD-10-CM | POA: Diagnosis not present

## 2021-05-20 DIAGNOSIS — R41841 Cognitive communication deficit: Secondary | ICD-10-CM

## 2021-05-20 DIAGNOSIS — I69359 Hemiplegia and hemiparesis following cerebral infarction affecting unspecified side: Secondary | ICD-10-CM | POA: Diagnosis not present

## 2021-05-20 DIAGNOSIS — J019 Acute sinusitis, unspecified: Secondary | ICD-10-CM | POA: Diagnosis not present

## 2021-05-20 DIAGNOSIS — R262 Difficulty in walking, not elsewhere classified: Secondary | ICD-10-CM | POA: Diagnosis not present

## 2021-05-20 DIAGNOSIS — R296 Repeated falls: Secondary | ICD-10-CM | POA: Diagnosis not present

## 2021-05-20 DIAGNOSIS — R531 Weakness: Secondary | ICD-10-CM | POA: Diagnosis not present

## 2021-05-20 NOTE — Therapy (Signed)
[facility] [address] [place], [place], [address] Phone: [phone]   Fax:  [phone]  Speech Language Pathology Treatment  Patient Details  Name: [name] MRN: [id number] Date of Birth: 1946-05-31 Referring Provider (SLP): Fenton Foy, NP   Encounter Date: 05/20/2021   End of Session - 05/20/21 1612     Visit Number 9    Number of Visits 17    Date for SLP Re-Evaluation 05/27/21    SLP Start Time 57    SLP Stop Time  1655    SLP Time Calculation (min) 40 min    Activity Tolerance Patient tolerated treatment well             Past Medical History:  Diagnosis Date   Allergic rhinitis    Anxiety    Atrial fibrillation ([place])    Cancer ([place])    hx of skin cancers    Chronic kidney disease    Coronary artery disease    a. s/p MI & CABG; b. s/p PCI Diag;  c. Cath 12/2011: LAD diff dzs/small, Diag 75% isr, 90 dist to stent (small), LCX & RCA occluded, VG->OM 40, VG->PDA patent - med rx.   COVID-19    x 3   Depression    PTSD   Diabetes mellitus    not on medications   Diastolic CHF, chronic ([place])    a. EF 55-60% by echo 2007   GERD (gastroesophageal reflux disease)    "not anymore" " I had a lap band"   History of diverticulitis of colon    5 YRS AGO   History of pneumonia    2017   History of transient ischemic attack (TIA)    CAROTID DOPPLERS NOV 2011  0-39& BIL. STENOSIS   Hyperlipidemia    Hypertension    Kidney failure    Mitral regurgitation    Myocardial infarction [facility])    Neuromuscular disorder (HCC)    left arm numbness    OA (osteoarthritis)    RIGHT KNEE ARTHOFIBROSIS W/ PAIN  (S/P  REPLACEMENT 2004)   PTSD (post-traumatic stress disorder)    from [place] since 1973   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/25/2016   Complex valvuloplasty including artificial Gore-tex neochord placement x4, plication of anterior commissure, and 26 mm Sorin Memo 3D ring  annuloplasty via right mini thoracotomy approach   Shortness of breath dyspnea    with exertion   Skin cancer    Sleep apnea    cpap- see ov note in EPIC 05/12/11 for settings    Stroke [facility])    hx of   Stroke [facility])     Past Surgical History:  Procedure Laterality Date   AV FISTULA PLACEMENT Left 08/02/2018   Procedure: ARTERIOVENOUS (AV) FISTULA CREATION RADIOCEPHALIC;  Surgeon: Angelia Mould, MD;  Location: [place];  Service: Vascular;  Laterality: Left;   BLEPHAROPLASTY  [place]  2001, 2004, 2009, 2011   CARDIAC CATHETERIZATION N/A 01/23/2016   Procedure: Right/Left Heart Cath and Coronary Angiography;  Surgeon: [name], MD;  Location: [place] CV LAB;  Service: Cardiovascular;  Laterality: N/A;   CARDIOVERSION N/A 09/07/2017   Procedure: CARDIOVERSION;  Surgeon: [name], MD;  Location: [facility] ENDOSCOPY;  Service: Cardiovascular;  Laterality: N/A;   CARDIOVERSION N/A 09/13/2019   Procedure: CARDIOVERSION;  Surgeon: [name], MD;  Location: [place];  Service: Cardiovascular;  Laterality: N/A;  CARDIOVERSION N/A 10/19/2019   Procedure: CARDIOVERSION;  Surgeon: [name], MD;  Location: [facility] ENDOSCOPY;  Service: Cardiovascular;  Laterality: N/A;   CATARACT EXTRACTION W/ INTRAOCULAR LENS IMPLANT Bilateral    CHONDROPLASTY  07/22/2011   Procedure: CHONDROPLASTY;  Surgeon: Dione Plover Aluisio;  Location: [place];  Service: Orthopedics;;   CIRCUMCISION  [place]-- POST CABG   W/ STENT, last cath 01/07/2012    CORONARY ARTERY BYPASS GRAFT  1995   X3 VESSEL   CORONARY ARTERY BYPASS GRAFT  1995   CORONARY STENT PLACEMENT     CYSTOSCOPY/URETEROSCOPY/HOLMIUM LASER/STENT PLACEMENT Right 01/08/2021   Procedure: CYSTOSCOPY/RETROGRADE/URETEROSCOPY/HOLMIUM LASER/STENT PLACEMENT;  Surgeon: Franchot Gallo, MD;  Location: [facility];  Service: Urology;  Laterality:  Right;   IR PERC TUN PERIT CATH WO PORT S&I /IMAG  03/06/2021   IR REMOVAL TUN CV CATH W/O FL  04/14/2021   KNEE ARTHROSCOPY  07/22/2011   Procedure: ARTHROSCOPY KNEE;  Surgeon: Dione Plover Aluisio;  Location: [place];  Service: Orthopedics;  Laterality: Right;  WITH DEBRIDEMENT    KNEE ARTHROSCOPY W/ MENISCECTOMY  X2 IN 2002-- RIGHT KNEE   LAPAROSCOPIC GASTRIC BANDING  01-15-09   LEFT HEART CATH AND CORONARY ANGIOGRAPHY N/A 04/29/2017   Procedure: LEFT HEART CATH AND CORONARY ANGIOGRAPHY;  Surgeon: [name], MD;  Location: [place] CV LAB;  Service: Cardiovascular;  Laterality: N/A;   LEFT HEART CATHETERIZATION WITH CORONARY ANGIOGRAM N/A 09/11/2013   Procedure: LEFT HEART CATHETERIZATION WITH CORONARY ANGIOGRAM;  Surgeon: [name], MD;  Location: [facility] CATH LAB;  Service: Cardiovascular;  Laterality: N/A;   MITRAL VALVE REPAIR Right 03/25/2016   Procedure: MINIMALLY INVASIVE REOPERATION FOR MITRAL VALVE REPAIR  (MVR) with size 26 Sorin Memo 3D;  Surgeon: [name], MD;  Location: [place];  Service: Open Heart Surgery;  Laterality: Right;   MITRAL VALVE REPAIR  03/2016   RIGHT KNEE ED COMPARTMENT REPLACEMENT  2004   SYNOVECTOMY  07/22/2011   Procedure: SYNOVECTOMY;  Surgeon: Dione Plover Aluisio;  Location: [place];  Service: Orthopedics;;   TEE WITHOUT CARDIOVERSION N/A 01/23/2016   Procedure: TRANSESOPHAGEAL ECHOCARDIOGRAM (TEE);  Surgeon: [name], MD;  Location: [place];  Service: Cardiovascular;  Laterality: N/A;   TEE WITHOUT CARDIOVERSION N/A 03/25/2016   Procedure: TRANSESOPHAGEAL ECHOCARDIOGRAM (TEE);  Surgeon: [name], MD;  Location: [place];  Service: Open Heart Surgery;  Laterality: N/A;   TEE WITHOUT CARDIOVERSION N/A 03/04/2021   Procedure: TRANSESOPHAGEAL ECHOCARDIOGRAM (TEE);  Surgeon: Lelon Perla, MD;  Location: [facility] ENDOSCOPY;  Service: Cardiovascular;  Laterality: N/A;   UMBILICAL HERNIA REPAIR  01-15-09   W/  GASTRIC BANDING PROCEDURE    There were no vitals filed for this visit.   Subjective Assessment - 05/20/21 1616     Subjective "You told me to bring my papers"    Currently in Pain? No/denies                   ADULT SLP TREATMENT - 05/20/21 1612       General Information   Behavior/Cognition Alert;Cooperative;Pleasant mood      Treatment Provided   Treatment provided Cognitive-Linquistic      Cognitive-Linquistic Treatment   Treatment focused on Cognition;Patient/family/caregiver education    Skilled Treatment Pt returned with assignment completed for work related task. Pt reported he worked from [place] to 3 am, which was effective  as he did not have external distractions. When pt re-read the document aloud the first time, he identified errors x2. When pt re-read the document aloud the second time, he identified additional errors x2. SLP typed document, in which pt able to identify 1/3 errors. Identification of errors improved with mod visual cues x2. Pt would like to continue with ST intervention; therefore, SLP requested additional sessions as pt is exhibiting good progress.      Assessment / Recommendations / Plan   Plan Continue with current plan of care      Progression Toward Goals   Progression toward goals Progressing toward goals              SLP Education - 05/20/21 1644     Education Details double/triple check, eliminating distractions, step-by-step sequencing    Person(s) Educated Patient    Methods Explanation;Demonstration;Handout    Comprehension Verbalized understanding;Returned demonstration;Need further instruction              SLP Short Term Goals - 05/05/21 1304       SLP SHORT TERM GOAL #1   Title Pt will use memory compensations for appointments, medicine management, and other daily activities with occasional min A over 2 sessions    Baseline 04-14-21, 05-05-21    Status Achieved    Target Date 05/02/21      SLP SHORT TERM GOAL #2    Title Pt will demonstrate selective attention to auditory/written instructions for completion of simple-mod complex task given 2 or less repetitions over 2 sessions    Baseline 04-14-21, 05-05-21    Status Achieved    Target Date 05/02/21      SLP SHORT TERM GOAL #3   Title Pt will ID and correct errors on structured cognitive tasks with 80% accuracy given occasional min A over 2 sessions    Baseline 04-14-21, 05-05-21    Status Achieved    Target Date 05/02/21      SLP SHORT TERM GOAL #4   Title Pt will complete simple to mod complex executive functioning tasks with 80% accuracy given occasional min A over 2 sessions    Baseline 05-05-21    Status Partially Met    Target Date 05/02/21      SLP SHORT TERM GOAL #5   Title Pt/caregiver will complete cognitive PROM in first 1-2 sessions    Baseline Cognitive Function=56    Status Achieved    Target Date 05/02/21              SLP Long Term Goals - 05/20/21 1612       SLP LONG TERM GOAL #1   Title Pt will use memory compensations for appointments, medicine management, and other daily activities with rare min A over 2 sessions    Baseline 05-15-21    Time 8    Period Weeks    Status On-going    Target Date 05/30/21      SLP LONG TERM GOAL #2   Title Pt will ID and correct errors on structured cognitive tasks with 90% accuracy given rare min A over 2 sessions    Baseline 05-19-21    Time 8    Period Weeks    Status On-going    Target Date 05/30/21      SLP LONG TERM GOAL #3   Title Pt will complete simple to mod complex executive functioning tasks with 80% accuracy given rare min A over 2 sessions    Baseline 05-19-21  Time 8    Period Weeks    Status On-going    Target Date 05/30/21      SLP LONG TERM GOAL #4   Title Pt/caregiver will report less frustration and report improved cognitive functioning via PROM by 2 past at last ST session    Time 8    Period Weeks    Status On-going    Target Date 05/30/21               Plan - 05/20/21 1612     Clinical Impression Statement [name] presents to OPST intervention to address cognitive changes s/p contraction of COVID-19. Pt completed HEP for return to ST which was doubled check according to patient. Rare intermittent errors noted, which pt able to correct with rare min A and extra time. SLP provided recommendations to aid attention and error awareness for functional tasks at home. Skilled ST is warranted to address cognitive communication skills to improve functional independence and safety as well as reduce caregiver burden and frustration related to cognitive impairment.    Speech Therapy Frequency 2x / week    Duration 8 weeks    Treatment/Interventions Compensatory strategies;Cueing hierarchy;Functional tasks;Patient/family education;Cognitive reorganization;Compensatory techniques;Internal/external aids;SLP instruction and feedback;Environmental controls    Potential to Achieve Goals Fair    Potential Considerations Ability to learn/carryover information;Cooperation/participation level    SLP Home Exercise Plan provided    Consulted and Agree with Plan of Care Patient             Patient will benefit from skilled therapeutic intervention in order to improve the following deficits and impairments:   Cognitive communication deficit    Problem List Patient Active Problem List   Diagnosis Date Noted   Enterococcal bacteremia    History of stroke    Prosthetic valve endocarditis ([place])    Bacteremia due to Gram-positive bacteria 02/27/2021   Anemia 02/26/2021   Weakness generalized 02/26/2021   CKD (chronic kidney disease) stage 4, GFR 15-29 ml/min ([place]) 02/26/2021   Atrial fibrillation, chronic (HCC) 02/26/2021   Chronic anticoagulation 02/26/2021   Prolonged QT interval 02/26/2021   Depressive disorder 02/25/2021   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[place]) 02/25/2021   Squamous cell carcinoma of skin of face 02/25/2021   Actinic keratosis 02/25/2021    Anxiety state 02/25/2021   Basal cell carcinoma of nose 02/25/2021   Basal cell carcinoma of skin of unspecified ear and external auricular canal 02/25/2021   Benign prostatic hyperplasia with lower urinary tract symptoms 02/25/2021   Bilateral pseudophakia 02/25/2021   Carcinoma in situ of skin of other parts of face 02/25/2021   Dental caries on smooth surface penetrating into dentin 02/25/2021   Deposits (accretions) on teeth 02/25/2021   Diverticulitis of colon 02/25/2021   Encounter for fitting and adjustment of hearing aid 02/25/2021   Encounter for removal of sutures 02/25/2021   Esophageal reflux 02/25/2021   Generalized osteoarthrosis, involving multiple sites 02/25/2021   Gout, unspecified 02/25/2021   History of other malignant neoplasm of skin 02/25/2021   Male erectile dysfunction, unspecified 02/25/2021   Peptic ulcer 02/25/2021   Post-traumatic stress disorder, unspecified 02/25/2021   Psychosexual dysfunction with inhibited sexual excitement 02/25/2021   Sensorineural hearing loss, bilateral 02/25/2021   Unspecified atrial fibrillation ([place]) 02/25/2021   Protein-calorie malnutrition, severe 02/04/2021   Cerebral thrombosis with cerebral infarction 02/02/2021   Right sided weakness 02/01/2021   History of COVID-19 01/29/2021   Memory loss 01/29/2021   Olfactory impairment 01/29/2021   Gait disturbance 01/29/2021  Fatigue 01/29/2021   Physical deconditioning 01/29/2021   Pressure injury of skin 01/07/2021   AKI (acute kidney injury) ([place]) 01/06/2021   Generalized weakness 01/06/2021   ARF (acute renal failure) ([place]) 01/06/2021   Dyspnea 10/05/2020   CHF (congestive heart failure) ([place]) 10/04/2020   Carpal tunnel syndrome of right wrist 09/25/2020   Pain of left hand 09/25/2020   COVID-19 virus infection 05/24/2020   Hypertensive chronic kidney disease with stage 1 through stage 4 chronic kidney disease, or unspecified chronic kidney disease    Coronary artery  disease    Type II diabetes mellitus ([place])    Hypertension    Back pain 04/08/2020   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/05/2019   Pain in right foot 03/22/2018   Cellulitis of left foot 03/01/2018   Pain of joint of left ankle and foot 09/06/2017   Cancer ([place])    Other specified counseling 04/03/2016   Pneumothorax on right 03/29/2016   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/25/2016   Atherosclerotic heart disease of native coronary artery without angina pectoris 03/10/2016   Heart failure ([place]) 03/10/2016   Cataract extraction status of eye 03/10/2016   Other long term (current) drug therapy 03/10/2016   Osteoarthritis of knee 03/10/2016   Vitamin D deficiency 03/10/2016   Mitral regurgitation    Avitaminosis D 12/25/2015   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   Arthritis of knee, degenerative 12/25/2015   H/O cataract extraction 12/25/2015   Atherosclerotic heart disease of native coronary artery with other forms of angina pectoris ([place]) 12/25/2015   Congestive heart failure ([place]) 12/25/2015   Pure hypercholesterolemia 12/25/2015   Allergic rhinitis 12/25/2015   Essential (primary) hypertension 12/25/2015   Hypotension 01/29/2014   Expected Blood loss, postoperative 04/29/2012   Pain due to unicompartmental arthroplasty of knee ([place]) 04/27/2012   Preoperative evaluation of a medical condition to rule out surgical contraindications (TAR required) 21/06/7355   Diastolic CHF, chronic ([place]) 06/29/2011   Preoperative evaluation to rule out surgical contraindication 06/29/2011   CAROTID ARTERY STENOSIS 07/09/2010   TIA 05/29/2010   FATIGUE / MALAISE 05/29/2010   CAD, AUTOLOGOUS BYPASS GRAFT 11/15/2008   MYOCARDIAL INFARCTION 12/18/2007   ALLERGIC RHINITIS WITH CONJUNCTIVITIS 12/18/2007   Type 2 diabetes mellitus without complications ([place]) 70/14/1030   Other and unspecified hyperlipidemia 12/05/2007   Other ill-defined and unknown causes of morbidity and mortality 12/05/2007    Essential hypertension 12/05/2007   Coronary atherosclerosis 12/05/2007   DEGENERATIVE JOINT DISEASE 12/05/2007   Obstructive sleep apnea of adult 12/05/2007    Jason Deem, MA CCC-SLP 05/20/2021, 6:01 PM  [facility] [address] [place], [place], [address] Phone: [phone]   Fax:  [phone]   Name: [name] MRN: [id number] Date of Birth: Apr 10, 1946

## 2021-05-21 ENCOUNTER — Encounter: Payer: Self-pay | Admitting: Vascular Surgery

## 2021-05-21 ENCOUNTER — Other Ambulatory Visit: Payer: Self-pay

## 2021-05-21 ENCOUNTER — Ambulatory Visit (INDEPENDENT_AMBULATORY_CARE_PROVIDER_SITE_OTHER): Payer: Medicare Other | Admitting: Vascular Surgery

## 2021-05-21 ENCOUNTER — Ambulatory Visit (HOSPITAL_COMMUNITY)
Admission: RE | Admit: 2021-05-21 | Discharge: 2021-05-21 | Disposition: A | Discharge status: Home / Self Care | Payer: Medicare Other | Source: Ambulatory Visit | Attending: Vascular Surgery | Admitting: Vascular Surgery

## 2021-05-21 VITALS — BP 125/71 | HR 67 | Temp 98.3°F | Resp 20 | Ht 70.5 in | Wt 181.0 lb

## 2021-05-21 DIAGNOSIS — M7122 Synovial cyst of popliteal space [Baker], left knee: Secondary | ICD-10-CM

## 2021-05-21 DIAGNOSIS — R52 Pain, unspecified: Secondary | ICD-10-CM | POA: Diagnosis not present

## 2021-05-21 NOTE — Progress Notes (Signed)
Patient ID: [name], male   DOB: 02-01-1946, 75 y.o.   MRN: [id number]  Reason for Consult: Follow-up   Referred by [name], MD  Subjective:     HPI:  [name] is a 75 y.o. male with chronic kidney disease.  After a recent fall he underwent duplex of his bilateral lower extremities which demonstrated vascular mass in his left popliteal fossa.  He does have some dilated veins there.  He continues to deny any pain.  Patient walks with the help of a cane.  He is on Eliquis for atrial fibrillation.  Past Medical History:  Diagnosis Date   Allergic rhinitis    Anxiety    Atrial fibrillation (HCC)    Cancer (HCC)    hx of skin cancers    Chronic kidney disease    Coronary artery disease    a. s/p MI & CABG; b. s/p PCI Diag;  c. Cath 12/2011: LAD diff dzs/small, Diag 75% isr, 90 dist to stent (small), LCX & RCA occluded, VG->OM 40, VG->PDA patent - med rx.   COVID-19    x 3   Depression    PTSD   Diabetes mellitus    not on medications   Diastolic CHF, chronic ([place])    a. EF 55-60% by echo 2007   GERD (gastroesophageal reflux disease)    "not anymore" " I had a lap band"   History of diverticulitis of colon    5 YRS AGO   History of pneumonia    2017   History of transient ischemic attack (TIA)    CAROTID DOPPLERS NOV 2011  0-39& BIL. 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   Complex valvuloplasty including artificial Gore-tex neochord placement x4, plication of anterior commissure, and 26 mm Sorin Memo 3D ring annuloplasty via right mini thoracotomy approach   Shortness of breath dyspnea    with exertion   Skin  cancer    Sleep apnea    cpap- see ov note in EPIC 05/12/11 for settings    Stroke ([place])    hx of   [facility])    Family History  Problem Relation Age of Onset   Other Mother        joint problems   Alzheimer's disease Mother    Hypertension Father    Heart disease Father    CVA Father        age 66   Depression Father    Heart disease Sister        CABG   Stroke Sister    Heart failure Sister        congestive   Diabetes Sister    Heart disease Brother    Hypertension Brother    Congestive Heart Failure Brother    Past Surgical History:  Procedure Laterality Date   AV FISTULA PLACEMENT Left 08/02/2018   Procedure: ARTERIOVENOUS (AV) FISTULA CREATION RADIOCEPHALIC;  Surgeon: Angelia Mould, MD;  Location: [facility] OR;  Service: Vascular;  Laterality: Left;   [place]  2001, 2004, 2009, 2011   CARDIAC CATHETERIZATION N/A 01/23/2016   Procedure: Right/Left Heart Cath and Coronary Angiography;  Surgeon: [name], MD;  Location: [place] CV LAB;  Service: Cardiovascular;  Laterality: N/A;   CARDIOVERSION N/A 09/07/2017   Procedure: CARDIOVERSION;  Surgeon: [name], MD;  Location: [facility] ENDOSCOPY;  Service: Cardiovascular;  Laterality: N/A;   CARDIOVERSION N/A 09/13/2019   Procedure: CARDIOVERSION;  Surgeon: [name], MD;  Location: [facility] ENDOSCOPY;  Service: Cardiovascular;  Laterality: N/A;   CARDIOVERSION N/A 10/19/2019   Procedure: CARDIOVERSION;  Surgeon: [name], MD;  Location: [facility] ENDOSCOPY;  Service: Cardiovascular;  Laterality: N/A;   CATARACT EXTRACTION W/ INTRAOCULAR LENS IMPLANT Bilateral    CHONDROPLASTY  07/22/2011   Procedure: CHONDROPLASTY;  Surgeon: Dione Plover Aluisio;  Location: [place];  Service: Orthopedics;;   CIRCUMCISION  [place]-- POST CABG   W/ STENT, last cath 01/07/2012    CORONARY ARTERY BYPASS GRAFT  1995    X3 VESSEL   CORONARY ARTERY BYPASS GRAFT  1995   CORONARY STENT PLACEMENT     CYSTOSCOPY/URETEROSCOPY/HOLMIUM LASER/STENT PLACEMENT Right 01/08/2021   Procedure: CYSTOSCOPY/RETROGRADE/URETEROSCOPY/HOLMIUM LASER/STENT PLACEMENT;  Surgeon: Franchot Gallo, MD;  Location: [facility] ORS;  Service: Urology;  Laterality: Right;   IR PERC TUN PERIT CATH WO PORT S&I /IMAG  03/06/2021   IR REMOVAL TUN CV CATH W/O FL  04/14/2021   KNEE ARTHROSCOPY  07/22/2011   Procedure: ARTHROSCOPY KNEE;  Surgeon: Dione Plover Aluisio;  Location: [place];  Service: Orthopedics;  Laterality: Right;  WITH DEBRIDEMENT    KNEE ARTHROSCOPY W/ MENISCECTOMY  X2 IN 2002-- RIGHT KNEE   LAPAROSCOPIC GASTRIC BANDING  01-15-09   LEFT HEART CATH AND CORONARY ANGIOGRAPHY N/A 04/29/2017   Procedure: LEFT HEART CATH AND CORONARY ANGIOGRAPHY;  Surgeon: [name], MD;  Location: [place] CV LAB;  Service: Cardiovascular;  Laterality: N/A;   LEFT HEART CATHETERIZATION WITH CORONARY ANGIOGRAM N/A 09/11/2013   Procedure: LEFT HEART CATHETERIZATION WITH CORONARY ANGIOGRAM;  Surgeon: [name], MD;  Location: [facility] CATH LAB;  Service: Cardiovascular;  Laterality: N/A;   MITRAL VALVE REPAIR Right 03/25/2016   Procedure: MINIMALLY INVASIVE REOPERATION FOR MITRAL VALVE REPAIR  (MVR) with size 26 Sorin Memo 3D;  Surgeon: [name], MD;  Location: [place];  Service: Open Heart Surgery;  Laterality: Right;   MITRAL VALVE REPAIR  03/2016   RIGHT KNEE ED COMPARTMENT REPLACEMENT  2004   SYNOVECTOMY  07/22/2011   Procedure: SYNOVECTOMY;  Surgeon: Dione Plover Aluisio;  Location: [place];  Service: Orthopedics;;   TEE WITHOUT CARDIOVERSION N/A 01/23/2016   Procedure: TRANSESOPHAGEAL ECHOCARDIOGRAM (TEE);  Surgeon: [name], MD;  Location: [place];  Service: Cardiovascular;  Laterality: N/A;   TEE WITHOUT CARDIOVERSION N/A 03/25/2016   Procedure: TRANSESOPHAGEAL ECHOCARDIOGRAM (TEE);  Surgeon: [name], MD;  Location: [place];  Service: Open Heart Surgery;  Laterality: N/A;   TEE WITHOUT CARDIOVERSION N/A 03/04/2021   Procedure: TRANSESOPHAGEAL ECHOCARDIOGRAM (TEE);  Surgeon: Lelon Perla, MD;  Location[facility] ENDOSCOPY;  Service: Cardiovascular;  Laterality: N/A;   UMBILICAL HERNIA REPAIR  01-15-09   W/ GASTRIC BANDING PROCEDURE    Short Social History:  Social History   Tobacco Use   Smoking status: Never   Smokeless tobacco: Never  Substance Use Topics   Alcohol use: Not Currently    Alcohol/week:  2.0 standard drinks    Types: 1 Glasses of wine, 1 Shots of liquor per week    Comment: OCCASIONAL    Allergies  Allergen Reactions   Crestor [Rosuvastatin] Other (See Comments)    Muscle aches    Lipitor [Atorvastatin] Other (See Comments)    Muscle aches   Pravastatin Other (See Comments)    Muscle aches   Carvedilol     Other reaction(s): loss of appetite   Metformin Diarrhea   Serotonin Reuptake Inhibitors (Ssris)     Other reaction(s): Muscle aches   Zocor [Simvastatin]     Other reaction(s): Muscle pain   Codeine Itching and Other (See Comments)    Extremity tingling-- can take synthetic    Current Outpatient Medications  Medication Sig Dispense Refill   acetaminophen (TYLENOL) 325 MG tablet Take 2 tablets (650 mg total) by mouth every 6 (six) hours as needed for mild pain (or Fever >/= 101).     Alirocumab (PRALUENT) 75 MG/ML SOAJ Inject 75 mg into the skin every 14 (fourteen) days.     allopurinol (ZYLOPRIM) 100 MG tablet Take 0.5 tablets (50 mg total) by mouth 2 (two) times a week. Please discuss dosing with your PCP based on your renal function. 4 tablet 0   amiodarone (PACERONE) 200 MG tablet Take 0.5 tablets (100 mg total) by mouth daily. 60 tablet 0   apixaban (ELIQUIS) 5 MG TABS tablet Take 1 tablet (5 mg total) by mouth 2 (two) times daily. 180 tablet 3   Ascorbic Acid (VITAMIN C WITH ROSE HIPS) 1000 MG tablet Take 1,000 mg by mouth daily.      calcitRIOL (ROCALTROL) 0.25 MCG capsule Take 0.25 mcg by mouth every other day.     Carboxymethylcellulose Sodium 1 % GEL APPLY 1 DROP TO EACH EYE AT BEDTIME     cetirizine (ZYRTEC) 10 MG tablet Take 10 mg by mouth daily.     colchicine 0.6 MG tablet Take 0.6-1.2 mg by mouth 2 (two) times daily as needed (gout).      fluticasone (FLONASE) 50 MCG/ACT nasal spray Place 1 spray into both nostrils daily as needed for allergies or rhinitis.     furosemide (LASIX) 40 MG tablet Take 40 mg by mouth daily.     HYDROcodone-acetaminophen (NORCO/VICODIN) 5-325 MG tablet Take 1-2 tablets by mouth every 4 (four) hours as needed for severe pain. 30 tablet 0   nitroGLYCERIN (NITROSTAT) 0.4 MG SL tablet 1 TAB UNDER THE TONGUE EVERY 5 MINUTES AS NEEDED FOR CHEST PAIN UP TO 3 DOSES, IF PERSIST CALL 911 75 tablet 1   ONETOUCH VERIO test strip 1 each daily.     pantoprazole (PROTONIX) 40 MG tablet Take 40 mg by mouth daily as needed (heartburn).     potassium chloride SA (KLOR-CON) 20 MEQ tablet Take 20 mEq by mouth daily.     Semaglutide,0.25 or 0.5MG /DOS, (OZEMPIC, 0.25 OR 0.5 MG/DOSE,) 2 MG/1.5ML SOPN Inject 0.375 mLs (0.5 mg total) into the skin every Thursday. 3 pen 3   tadalafil (CIALIS) 20 MG tablet TAKE 1 TABLET BY MOUTH ONCE DAILY AS NEEDED 10 tablet 0   traZODone (DESYREL) 100 MG tablet Take 100 mg by mouth at bedtime.     No current facility-administered medications for this visit.    Review of Systems  Constitutional:  Constitutional negative. HENT: HENT negative.  Eyes: Eyes negative.  Cardiovascular: Positive for leg swelling.  GI: Gastrointestinal negative.  Musculoskeletal: Musculoskeletal negative.  Skin: Skin negative.  Neurological: Neurological  negative. Hematologic: Hematologic/lymphatic negative.  Psychiatric: Psychiatric negative.       Objective:  Objective  Vitals:   05/21/21 1600  BP: 125/71  Pulse: 67  Resp: 20  Temp: 98.3 F (36.8 C)  SpO2: 99%      Physical  Exam HENT:     Head: Normocephalic.     Nose:     Comments: Wearing a mask Cardiovascular:     Rate and Rhythm: Normal rate.     Pulses: Normal pulses.  Pulmonary:     Effort: Pulmonary effort is normal.  Abdominal:     General: Abdomen is flat.  Musculoskeletal:        General: Normal range of motion.     Cervical back: Normal range of motion.     Comments: Palpable small saphenous vein on the left No palpable masses in his popliteal fossa on the left Large left radiocephalic  av fistula  Skin:    General: Skin is warm and dry.     Capillary Refill: Capillary refill takes less than 2 seconds.  Neurological:     General: No focal deficit present.     Mental Status: He is alert.    Data: +-----+---------------+---------+-----------+----------+--------------+  RIGHTCompressibilityPhasicitySpontaneityPropertiesThrombus Aging  +-----+---------------+---------+-----------+----------+--------------+  CFV  Full           Yes      Yes                                  +-----+---------------+---------+-----------+----------+--------------+           +---------+---------------+---------+-----------+----------+--------------+   LEFT     CompressibilityPhasicitySpontaneityPropertiesThrombus  Aging  +---------+---------------+---------+-----------+----------+--------------+   CFV      Full           Yes      Yes                                    +---------+---------------+---------+-----------+----------+--------------+   SFJ      Full           Yes      Yes                                    +---------+---------------+---------+-----------+----------+--------------+   FV Prox  Full           Yes      Yes                                    +---------+---------------+---------+-----------+----------+--------------+   FV Mid   Full           Yes      Yes                                     +---------+---------------+---------+-----------+----------+--------------+   FV DistalFull           Yes      Yes                                    +---------+---------------+---------+-----------+----------+--------------+   PFV  Full           Yes      Yes                                    +---------+---------------+---------+-----------+----------+--------------+   POP      Full           Yes      Yes                                    +---------+---------------+---------+-----------+----------+--------------+   PTV      Full           Yes      Yes                                    +---------+---------------+---------+-----------+----------+--------------+   PERO     Full           Yes      Yes                                    +---------+---------------+---------+-----------+----------+--------------+   Gastroc  Full           Yes      Yes                                    +---------+---------------+---------+-----------+----------+--------------+   GSV      Full           Yes      Yes                                    +---------+---------------+---------+-----------+----------+--------------+   SSV      Full           Yes      Yes                                    +---------+---------------+---------+-----------+----------+--------------+               Summary:   LEFT:  - There is no evidence of acute deep vein thrombosis in the lower  extremity.  - Dilated gastrocnemius veins with rouleaux flow again noted.  - No complex structure visualized in the popliteal fossa.      Assessment/Plan:     75 year old male previously had structure identified in his popliteal fossa does have some dilated veins with a easily palpable small saphenous vein.  I cannot palpate anything in his popliteal fossa and he has no pain there.  He can follow-up with me on an as-needed basis.     Waynetta Sandy MD [facility]

## 2021-05-28 ENCOUNTER — Other Ambulatory Visit: Payer: Self-pay

## 2021-05-28 ENCOUNTER — Ambulatory Visit: Payer: Medicare Other | Attending: Nurse Practitioner

## 2021-05-28 DIAGNOSIS — R41841 Cognitive communication deficit: Secondary | ICD-10-CM | POA: Insufficient documentation

## 2021-05-28 NOTE — Patient Instructions (Addendum)
Check to make sure the mouse connection is inserted into the computer   Finish homework. Make sure you double check!

## 2021-05-28 NOTE — Therapy (Signed)
[facility] [address] [place], [place], [address] Phone: [phone]   Fax:  [phone]  Speech Language Pathology Treatment/Progress Note/Recert  Patient Details  Name: [name] MRN: [id number] Date of Birth: 07/20/1946 Referring Provider (SLP): Fenton Foy, NP   Encounter Date: 05/28/2021   End of Session - 05/28/21 1525     Visit Number 10    Number of Visits 17    Date for SLP Re-Evaluation 09/01/30   re-cert for 1x/week for 6 weeks   SLP Start Time 1504   pt called ahead for late arrival due to traffic   SLP Stop Time  1550    SLP Time Calculation (min) 46 min    Activity Tolerance Patient tolerated treatment well             Past Medical History:  Diagnosis Date   Allergic rhinitis    Anxiety    Atrial fibrillation ([place])    Cancer ([place])    hx of skin cancers    Chronic kidney disease    Coronary artery disease    a. s/p MI & CABG; b. s/p PCI Diag;  c. Cath 12/2011: LAD diff dzs/small, Diag 75% isr, 90 dist to stent (small), LCX & RCA occluded, VG->OM 40, VG->PDA patent - med rx.   COVID-19    x 3   Depression    PTSD   Diabetes mellitus    not on medications   Diastolic CHF, chronic ([place])    a. EF 55-60% by echo 2007   GERD (gastroesophageal reflux disease)    "not anymore" " I had a lap band"   History of diverticulitis of colon    5 YRS AGO   History of pneumonia    2017   History of transient ischemic attack (TIA)    CAROTID DOPPLERS NOV 2011  0-39& BIL. STENOSIS   Hyperlipidemia    Hypertension    Kidney failure    Mitral regurgitation    Myocardial infarction [facility])    Neuromuscular disorder (HCC)    left arm numbness    OA (osteoarthritis)    RIGHT KNEE ARTHOFIBROSIS W/ PAIN  (S/P  REPLACEMENT 2004)   PTSD (post-traumatic stress disorder)    from [place] since 1973   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/25/2016   Complex valvuloplasty including artificial  Gore-tex neochord placement x4, plication of anterior commissure, and 26 mm Sorin Memo 3D ring annuloplasty via right mini thoracotomy approach   Shortness of breath dyspnea    with exertion   Skin cancer    Sleep apnea    cpap- see ov note in EPIC 05/12/11 for settings    Stroke [facility])    hx of   Stroke [facility])     Past Surgical History:  Procedure Laterality Date   AV FISTULA PLACEMENT Left 08/02/2018   Procedure: ARTERIOVENOUS (AV) FISTULA CREATION RADIOCEPHALIC;  Surgeon: Angelia Mould, MD;  Location: [place];  Service: Vascular;  Laterality: Left;   BLEPHAROPLASTY  [place]  2001, 2004, 2009, 2011   CARDIAC CATHETERIZATION N/A 01/23/2016   Procedure: Right/Left Heart Cath and Coronary Angiography;  Surgeon: [name], MD;  Location: [place] CV LAB;  Service: Cardiovascular;  Laterality: N/A;   CARDIOVERSION N/A 09/07/2017   Procedure: CARDIOVERSION;  Surgeon: [name], MD;  Location: [facility] ENDOSCOPY;  Service: Cardiovascular;  Laterality: N/A;   CARDIOVERSION N/A 09/13/2019   Procedure: CARDIOVERSION;  Surgeon: [name], MD;  Location: [facility] ENDOSCOPY;  Service: Cardiovascular;  Laterality: N/A;   CARDIOVERSION N/A 10/19/2019   Procedure: CARDIOVERSION;  Surgeon: [name], MD;  Location: [facility] ENDOSCOPY;  Service: Cardiovascular;  Laterality: N/A;   CATARACT EXTRACTION W/ INTRAOCULAR LENS IMPLANT Bilateral    CHONDROPLASTY  07/22/2011   Procedure: CHONDROPLASTY;  Surgeon: Dione Plover Aluisio;  Location: [place];  Service: Orthopedics;;   CIRCUMCISION  [place]-- POST CABG   W/ STENT, last cath 01/07/2012    CORONARY ARTERY BYPASS GRAFT  1995   X3 VESSEL   CORONARY ARTERY BYPASS GRAFT  1995   CORONARY STENT PLACEMENT     CYSTOSCOPY/URETEROSCOPY/HOLMIUM LASER/STENT PLACEMENT Right 01/08/2021   Procedure: CYSTOSCOPY/RETROGRADE/URETEROSCOPY/HOLMIUM LASER/STENT  PLACEMENT;  Surgeon: Franchot Gallo, MD;  Location: [facility];  Service: Urology;  Laterality: Right;   IR PERC TUN PERIT CATH WO PORT S&I /IMAG  03/06/2021   IR REMOVAL TUN CV CATH W/O FL  04/14/2021   KNEE ARTHROSCOPY  07/22/2011   Procedure: ARTHROSCOPY KNEE;  Surgeon: Dione Plover Aluisio;  Location: [place];  Service: Orthopedics;  Laterality: Right;  WITH DEBRIDEMENT    KNEE ARTHROSCOPY W/ MENISCECTOMY  X2 IN 2002-- RIGHT KNEE   LAPAROSCOPIC GASTRIC BANDING  01-15-09   LEFT HEART CATH AND CORONARY ANGIOGRAPHY N/A 04/29/2017   Procedure: LEFT HEART CATH AND CORONARY ANGIOGRAPHY;  Surgeon: [name], MD;  Location: [place] CV LAB;  Service: Cardiovascular;  Laterality: N/A;   LEFT HEART CATHETERIZATION WITH CORONARY ANGIOGRAM N/A 09/11/2013   Procedure: LEFT HEART CATHETERIZATION WITH CORONARY ANGIOGRAM;  Surgeon: [name], MD;  Location: [facility] CATH LAB;  Service: Cardiovascular;  Laterality: N/A;   MITRAL VALVE REPAIR Right 03/25/2016   Procedure: MINIMALLY INVASIVE REOPERATION FOR MITRAL VALVE REPAIR  (MVR) with size 26 Sorin Memo 3D;  Surgeon: [name], MD;  Location: [place];  Service: Open Heart Surgery;  Laterality: Right;   MITRAL VALVE REPAIR  03/2016   RIGHT KNEE ED COMPARTMENT REPLACEMENT  2004   SYNOVECTOMY  07/22/2011   Procedure: SYNOVECTOMY;  Surgeon: Dione Plover Aluisio;  Location: [place];  Service: Orthopedics;;   TEE WITHOUT CARDIOVERSION N/A 01/23/2016   Procedure: TRANSESOPHAGEAL ECHOCARDIOGRAM (TEE);  Surgeon: [name], MD;  Location: [place];  Service: Cardiovascular;  Laterality: N/A;   TEE WITHOUT CARDIOVERSION N/A 03/25/2016   Procedure: TRANSESOPHAGEAL ECHOCARDIOGRAM (TEE);  Surgeon: [name], MD;  Location: [place];  Service: Open Heart Surgery;  Laterality: N/A;   TEE WITHOUT CARDIOVERSION N/A 03/04/2021   Procedure: TRANSESOPHAGEAL ECHOCARDIOGRAM (TEE);  Surgeon: Lelon Perla, MD;  Location: [facility]  ENDOSCOPY;  Service: Cardiovascular;  Laterality: N/A;   UMBILICAL HERNIA REPAIR  01-15-09   W/ GASTRIC BANDING PROCEDURE    There were no vitals filed for this visit.   Subjective Assessment - 05/28/21 1505     Subjective "I proofread it three or four times"    Currently in Pain? No/denies              Speech Therapy Progress Note  Dates of Reporting Period: 04-01-21 to current   Objective Reports of Subjective Statement: Pt has been seen for 10 ST visits targeting cognitive linguistic skills. Pt reports subjective improvements but pt endorses he has not yet returned to baseline.   Objective Measurements: Pt exhibits improvements in cognitive linguistic skills, including attention to  detail, error awareness, and problem solving. Adequate recall exhibited overall with rare episodes of forgetfulness reported or exhibited. SLP continues to educate patient on slowing down to aid processing, double checking for errors, and safety awareness.   Goal Update: see goals below   Plan: continue per POC  Reason Skilled Services are Required: Pt has not maximized rehab potential; therefore, SLP recommends continuation of skilled ST intervention targeting cognitive communication to maximize functional independence and safety.      ADULT SLP TREATMENT - 05/28/21 1506       General Information   Behavior/Cognition Alert;Cooperative;Pleasant mood      Treatment Provided   Treatment provided Cognitive-Linquistic      Cognitive-Linquistic Treatment   Treatment focused on Cognition;Patient/family/caregiver education    Skilled Treatment Pt reported checking work proposal multiple times, in which pt caught 4-5 spelling errors. Pt reports no recent falls, although some questionable safety awareness indicated as near miss reported while picking up branches. Pt was alone and did not have assistive equipment with him. SLP targeted functional calculations to address problem solving and error  awareness. SLP cued double check and provided full sheet of paper, as pt kept reaching for small sticky notes. Pt was 71% accurate and required min to mod cues to ID errors.      Assessment / Recommendations / Plan   Plan Continue with current plan of care;Goals updated      Progression Toward Goals   Progression toward goals Progressing toward goals              SLP Education - 05/28/21 1515     Education Details functional application of memory strategies    Person(s) Educated Patient    Methods Explanation;Demonstration;Handout    Comprehension Verbalized understanding;Returned demonstration;Need further instruction              SLP Short Term Goals - 05/05/21 1304       SLP SHORT TERM GOAL #1   Title Pt will use memory compensations for appointments, medicine management, and other daily activities with occasional min A over 2 sessions    Baseline 04-14-21, 05-05-21    Status Achieved    Target Date 05/02/21      SLP SHORT TERM GOAL #2   Title Pt will demonstrate selective attention to auditory/written instructions for completion of simple-mod complex task given 2 or less repetitions over 2 sessions    Baseline 04-14-21, 05-05-21    Status Achieved    Target Date 05/02/21      SLP SHORT TERM GOAL #3   Title Pt will ID and correct errors on structured cognitive tasks with 80% accuracy given occasional min A over 2 sessions    Baseline 04-14-21, 05-05-21    Status Achieved    Target Date 05/02/21      SLP SHORT TERM GOAL #4   Title Pt will complete simple to mod complex executive functioning tasks with 80% accuracy given occasional min A over 2 sessions    Baseline 05-05-21    Status Partially Met    Target Date 05/02/21      SLP SHORT TERM GOAL #5   Title Pt/caregiver will complete cognitive PROM in first 1-2 sessions    Baseline Cognitive Function=56    Status Achieved    Target Date 05/02/21              SLP Long Term Goals - 05/28/21 1525       SLP  LONG TERM GOAL #1  Title Pt will use memory compensations for appointments, medicine management, and other daily activities with rare min A over 2 sessions    Baseline 05-15-21, 05-28-21    Status Achieved      SLP LONG TERM GOAL #2   Title Pt will ID and correct errors on structured cognitive tasks with 90% accuracy given rare min A over 4 sessions    Baseline 05-19-21    Time 8    Period Weeks    Status Revised    Target Date 07/11/21      SLP LONG TERM GOAL #3   Title Pt will complete simple to mod complex executive functioning tasks with 90% accuracy given rare min A over 3 sessions    Baseline 05-19-21    Time 8    Period Weeks    Status Revised    Target Date 07/11/21      SLP LONG TERM GOAL #4   Title Pt/caregiver will report less frustration and report improved cognitive functioning via PROM by 5 points at last ST session    Time 8    Period Weeks    Status Revised    Target Date 07/11/21      SLP LONG TERM GOAL #5   Title Pt will identify and correct errors on funtional tasks related to work and home given rare min A over 2 sessions    Time 6    Period Weeks    Status New    Target Date 07/11/21              Plan - 05/28/21 1517     Clinical Impression Statement [name] presents to OPST intervention to address cognitive changes s/p contraction of COVID-19. Pt reports he is double checking word related tasks with pt able to ID errors. Pt is exhibiting good progress for attention and problem solving but endorses he has not yet returned to baseline. SLP recommends 1x/week for 6 more weeks to address remaining deficits. Skilled ST is warranted to address cognitive communication skills to improve functional independence and safety as well as reduce caregiver burden and frustration related to cognitive impairment.    Speech Therapy Frequency 1x /week    Duration Other (comment)   6 weeks   Treatment/Interventions Compensatory strategies;Cueing hierarchy;Functional  tasks;Patient/family education;Cognitive reorganization;Compensatory techniques;Internal/external aids;SLP instruction and feedback;Environmental controls    Potential to Achieve Goals Good    SLP Home Exercise Plan provided    Consulted and Agree with Plan of Care Patient             Patient will benefit from skilled therapeutic intervention in order to improve the following deficits and impairments:   Cognitive communication deficit    Problem List Patient Active Problem List   Diagnosis Date Noted   Enterococcal bacteremia    History of stroke    Prosthetic valve endocarditis ([place])    Bacteremia due to Gram-positive bacteria 02/27/2021   Anemia 02/26/2021   Weakness generalized 02/26/2021   CKD (chronic kidney disease) stage 4, GFR 15-29 ml/min ([place]) 02/26/2021   Atrial fibrillation, chronic (HCC) 02/26/2021   Chronic anticoagulation 02/26/2021   Prolonged QT interval 02/26/2021   Depressive disorder 02/25/2021   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[place]) 02/25/2021   Squamous cell carcinoma of skin of face 02/25/2021   Actinic keratosis 02/25/2021   Anxiety state 02/25/2021   Basal cell carcinoma of nose 02/25/2021   Basal cell carcinoma of skin of unspecified ear and external auricular canal 02/25/2021   Benign prostatic hyperplasia  with lower urinary tract symptoms 02/25/2021   Bilateral pseudophakia 02/25/2021   Carcinoma in situ of skin of other parts of face 02/25/2021   Dental caries on smooth surface penetrating into dentin 02/25/2021   Deposits (accretions) on teeth 02/25/2021   Diverticulitis of colon 02/25/2021   Encounter for fitting and adjustment of hearing aid 02/25/2021   Encounter for removal of sutures 02/25/2021   Esophageal reflux 02/25/2021   Generalized osteoarthrosis, involving multiple sites 02/25/2021   Gout, unspecified 02/25/2021   History of other malignant neoplasm of skin 02/25/2021   Male erectile dysfunction, unspecified 02/25/2021   Peptic ulcer  02/25/2021   Post-traumatic stress disorder, unspecified 02/25/2021   Psychosexual dysfunction with inhibited sexual excitement 02/25/2021   Sensorineural hearing loss, bilateral 02/25/2021   Unspecified atrial fibrillation ([place]) 02/25/2021   Protein-calorie malnutrition, severe 02/04/2021   Cerebral thrombosis with cerebral infarction 02/02/2021   Right sided weakness 02/01/2021   History of COVID-19 01/29/2021   Memory loss 01/29/2021   Olfactory impairment 01/29/2021   Gait disturbance 01/29/2021   Fatigue 01/29/2021   Physical deconditioning 01/29/2021   Pressure injury of skin 01/07/2021   AKI (acute kidney injury) ([place]) 01/06/2021   Generalized weakness 01/06/2021   ARF (acute renal failure) ([place]) 01/06/2021   Dyspnea 10/05/2020   CHF (congestive heart failure) ([place]) 10/04/2020   Carpal tunnel syndrome of right wrist 09/25/2020   Pain of left hand 09/25/2020   COVID-19 virus infection 05/24/2020   Hypertensive chronic kidney disease with stage 1 through stage 4 chronic kidney disease, or unspecified chronic kidney disease    Coronary artery disease    Type II diabetes mellitus ([place])    Hypertension    Back pain 04/08/2020   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/05/2019   Pain in right foot 03/22/2018   Cellulitis of left foot 03/01/2018   Pain of joint of left ankle and foot 09/06/2017   Cancer ([place])    Other specified counseling 04/03/2016   Pneumothorax on right 03/29/2016   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/25/2016   Atherosclerotic heart disease of native coronary artery without angina pectoris 03/10/2016   Heart failure ([place]) 03/10/2016   Cataract extraction status of eye 03/10/2016   Other long term (current) drug therapy 03/10/2016   Osteoarthritis of knee 03/10/2016   Vitamin D deficiency 03/10/2016   Mitral regurgitation    Avitaminosis D 12/25/2015   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   Arthritis of knee, degenerative 12/25/2015   H/O cataract  extraction 12/25/2015   Atherosclerotic heart disease of native coronary artery with other forms of angina pectoris ([place]) 12/25/2015   Congestive heart failure ([place]) 12/25/2015   Pure hypercholesterolemia 12/25/2015   Allergic rhinitis 12/25/2015   Essential (primary) hypertension 12/25/2015   Hypotension 01/29/2014   Expected Blood loss, postoperative 04/29/2012   Pain due to unicompartmental arthroplasty of knee ([place]) 04/27/2012   Preoperative evaluation of a medical condition to rule out surgical contraindications (TAR required) 67/20/9470   Diastolic CHF, chronic ([place]) 06/29/2011   Preoperative evaluation to rule out surgical contraindication 06/29/2011   CAROTID ARTERY STENOSIS 07/09/2010   TIA 05/29/2010   FATIGUE / MALAISE 05/29/2010   CAD, AUTOLOGOUS BYPASS GRAFT 11/15/2008   MYOCARDIAL INFARCTION 12/18/2007   ALLERGIC RHINITIS WITH CONJUNCTIVITIS 12/18/2007   Type 2 diabetes mellitus without complications ([place]) 96/28/3662   Other and unspecified hyperlipidemia 12/05/2007   Other ill-defined and unknown causes of morbidity and mortality 12/05/2007   Essential hypertension 12/05/2007   Coronary atherosclerosis 12/05/2007   DEGENERATIVE  JOINT DISEASE 12/05/2007   Obstructive sleep apnea of adult 12/05/2007    Alinda Deem, MA CCC-SLP 05/28/2021, 4:11 PM  [facility] [address] [place], [place], [address] Phone: [phone]   Fax:  [phone]   Name: [name] MRN: [id number] Date of Birth: 12/22/1945

## 2021-05-29 DIAGNOSIS — R531 Weakness: Secondary | ICD-10-CM | POA: Diagnosis not present

## 2021-05-29 DIAGNOSIS — R296 Repeated falls: Secondary | ICD-10-CM | POA: Diagnosis not present

## 2021-05-29 DIAGNOSIS — I69359 Hemiplegia and hemiparesis following cerebral infarction affecting unspecified side: Secondary | ICD-10-CM | POA: Diagnosis not present

## 2021-05-29 DIAGNOSIS — R262 Difficulty in walking, not elsewhere classified: Secondary | ICD-10-CM | POA: Diagnosis not present

## 2021-06-04 ENCOUNTER — Other Ambulatory Visit: Payer: Self-pay

## 2021-06-04 ENCOUNTER — Ambulatory Visit: Payer: Medicare Other

## 2021-06-04 DIAGNOSIS — R41841 Cognitive communication deficit: Secondary | ICD-10-CM | POA: Diagnosis not present

## 2021-06-04 DIAGNOSIS — R531 Weakness: Secondary | ICD-10-CM | POA: Diagnosis not present

## 2021-06-04 DIAGNOSIS — R262 Difficulty in walking, not elsewhere classified: Secondary | ICD-10-CM | POA: Diagnosis not present

## 2021-06-04 DIAGNOSIS — R296 Repeated falls: Secondary | ICD-10-CM | POA: Diagnosis not present

## 2021-06-04 DIAGNOSIS — I69359 Hemiplegia and hemiparesis following cerebral infarction affecting unspecified side: Secondary | ICD-10-CM | POA: Diagnosis not present

## 2021-06-04 NOTE — Therapy (Signed)
[facility] [address] [place], [place], [address] Phone: [phone]   Fax:  [phone]  Speech Language Pathology Treatment  Patient Details  Name: [name] MRN: [id number] Date of Birth: [birth year] Referring Provider (SLP): Fenton Foy, NP   Encounter Date: 06/04/2021   End of Session - 06/04/21 1651     Visit Number 11    Number of Visits 17    Date for SLP Re-Evaluation 80/99/83   re-cert for 1x/week for 6 weeks   SLP Start Time 1452    SLP Stop Time  3825    SLP Time Calculation (min) 43 min    Activity Tolerance Patient tolerated treatment well             Past Medical History:  Diagnosis Date   Allergic rhinitis    Anxiety    Atrial fibrillation ([place])    Cancer ([place])    hx of skin cancers    Chronic kidney disease    Coronary artery disease    a. s/p MI & CABG; b. s/p PCI Diag;  c. Cath 12/2011: LAD diff dzs/small, Diag 75% isr, 90 dist to stent (small), LCX & RCA occluded, VG->OM 40, VG->PDA patent - med rx.   COVID-19    x 3   Depression    PTSD   Diabetes mellitus    not on medications   Diastolic CHF, chronic ([place])    a. EF 55-60% by echo 2007   GERD (gastroesophageal reflux disease)    "not anymore" " I had a lap band"   History of diverticulitis of colon    5 YRS AGO   History of pneumonia    2017   History of transient ischemic attack (TIA)    CAROTID DOPPLERS NOV 2011  0-39& BIL. STENOSIS   Hyperlipidemia    Hypertension    Kidney failure    Mitral regurgitation    Myocardial infarction [facility])    Neuromuscular disorder (HCC)    left arm numbness    OA (osteoarthritis)    RIGHT KNEE ARTHOFIBROSIS W/ PAIN  (S/P  REPLACEMENT 2004)   PTSD (post-traumatic stress disorder)    from [place] since 1973   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/25/2016   Complex valvuloplasty including artificial Gore-tex neochord placement x4, plication of anterior commissure, and  26 mm Sorin Memo 3D ring annuloplasty via right mini thoracotomy approach   Shortness of breath dyspnea    with exertion   Skin cancer    Sleep apnea    cpap- see ov note in EPIC 05/12/11 for settings    Stroke [facility])    hx of   Stroke [facility])     Past Surgical History:  Procedure Laterality Date   AV FISTULA PLACEMENT Left 08/02/2018   Procedure: ARTERIOVENOUS (AV) FISTULA CREATION RADIOCEPHALIC;  Surgeon: Angelia Mould, MD;  Location: [facility];  Service: Vascular;  Laterality: Left;   BLEPHAROPLASTY  [place]  2001, 2004, 2009, 2011   CARDIAC CATHETERIZATION N/A 01/23/2016   Procedure: Right/Left Heart Cath and Coronary Angiography;  Surgeon: [name], MD;  Location: [facility] CV LAB;  Service: Cardiovascular;  Laterality: N/A;   CARDIOVERSION N/A 09/07/2017   Procedure: CARDIOVERSION;  Surgeon: [name], MD;  Location: [facility] ENDOSCOPY;  Service: Cardiovascular;  Laterality: N/A;   CARDIOVERSION N/A 09/13/2019   Procedure: CARDIOVERSION;  Surgeon: [name], MD;  Location: [facility] ENDOSCOPY;  Service: Cardiovascular;  Laterality: N/A;   CARDIOVERSION N/A 10/19/2019   Procedure: CARDIOVERSION;  Surgeon: [name], MD;  Location: [facility] ENDOSCOPY;  Service: Cardiovascular;  Laterality: N/A;   CATARACT EXTRACTION W/ INTRAOCULAR LENS IMPLANT Bilateral    CHONDROPLASTY  07/22/2011   Procedure: CHONDROPLASTY;  Surgeon: Dione Plover Aluisio;  Location: [facility];  Service: Orthopedics;;   CIRCUMCISION  [place]-- POST CABG   W/ STENT, last cath 01/07/2012    CORONARY ARTERY BYPASS GRAFT  1995   X3 VESSEL   CORONARY ARTERY BYPASS GRAFT  1995   CORONARY STENT PLACEMENT     CYSTOSCOPY/URETEROSCOPY/HOLMIUM LASER/STENT PLACEMENT Right 01/08/2021   Procedure: CYSTOSCOPY/RETROGRADE/URETEROSCOPY/HOLMIUM LASER/STENT PLACEMENT;  Surgeon: Franchot Gallo, MD;  Location: [facility];   Service: Urology;  Laterality: Right;   IR PERC TUN PERIT CATH WO PORT S&I /IMAG  03/06/2021   IR REMOVAL TUN CV CATH W/O FL  04/14/2021   KNEE ARTHROSCOPY  07/22/2011   Procedure: ARTHROSCOPY KNEE;  Surgeon: Dione Plover Aluisio;  Location: [facility];  Service: Orthopedics;  Laterality: Right;  WITH DEBRIDEMENT    KNEE ARTHROSCOPY W/ MENISCECTOMY  X2 IN 2002-- RIGHT KNEE   LAPAROSCOPIC GASTRIC BANDING  01-15-09   LEFT HEART CATH AND CORONARY ANGIOGRAPHY N/A 04/29/2017   Procedure: LEFT HEART CATH AND CORONARY ANGIOGRAPHY;  Surgeon: [name], MD;  Location: [place] CV LAB;  Service: Cardiovascular;  Laterality: N/A;   LEFT HEART CATHETERIZATION WITH CORONARY ANGIOGRAM N/A 09/11/2013   Procedure: LEFT HEART CATHETERIZATION WITH CORONARY ANGIOGRAM;  Surgeon: [name], MD;  Location: [facility] CATH LAB;  Service: Cardiovascular;  Laterality: N/A;   MITRAL VALVE REPAIR Right 03/25/2016   Procedure: MINIMALLY INVASIVE REOPERATION FOR MITRAL VALVE REPAIR  (MVR) with size 26 Sorin Memo 3D;  Surgeon: [name], MD;  Location: [place];  Service: Open Heart Surgery;  Laterality: Right;   MITRAL VALVE REPAIR  03/2016   RIGHT KNEE ED COMPARTMENT REPLACEMENT  2004   SYNOVECTOMY  07/22/2011   Procedure: SYNOVECTOMY;  Surgeon: Dione Plover Aluisio;  Location: [place];  Service: Orthopedics;;   TEE WITHOUT CARDIOVERSION N/A 01/23/2016   Procedure: TRANSESOPHAGEAL ECHOCARDIOGRAM (TEE);  Surgeon: [name], MD;  Location: [facility];  Service: Cardiovascular;  Laterality: N/A;   TEE WITHOUT CARDIOVERSION N/A 03/25/2016   Procedure: TRANSESOPHAGEAL ECHOCARDIOGRAM (TEE);  Surgeon: [name], MD;  Location: [place];  Service: Open Heart Surgery;  Laterality: N/A;   TEE WITHOUT CARDIOVERSION N/A 03/04/2021   Procedure: TRANSESOPHAGEAL ECHOCARDIOGRAM (TEE);  Surgeon: Lelon Perla, MD;  Location: [facility] ENDOSCOPY;  Service: Cardiovascular;  Laterality: N/A;   UMBILICAL  HERNIA REPAIR  01-15-09   W/ GASTRIC BANDING PROCEDURE    There were no vitals filed for this visit.   Subjective Assessment - 06/04/21 1652     Subjective "I over did it"    Currently in Pain? Yes    Pain Score 5     Pain Location Other (Comment)   all over   Pain Descriptors / Indicators Sore                   ADULT SLP TREATMENT - 06/04/21 1651       General Information   Behavior/Cognition Alert;Cooperative;Pleasant mood      Treatment Provided   Treatment provided Cognitive-Linquistic      Cognitive-Linquistic Treatment   Treatment focused  on Cognition;Patient/family/caregiver education    Skilled Treatment Pt indicated he "over did it" this weekend as he cut down tree with wife's help. SLP inquired about safety awareness, in which pt indicated this task was safe to complete as he "didn't fall." SLP trialed deductive reasoning puzzle to target problem solving, attention, and awareness. Mod cues required to complete first task, as cued double checking did not aid accuracy. Accuracy improved in subsequent trials with rare mod SLP cue required. Pt was intermittently independently double checking without SLP cues after initial trial.      Assessment / Recommendations / Plan   Plan Continue with current plan of care     Progression Toward Goals   Progression toward goals Progressing toward goals              SLP Education - 06/04/21 1710     Education Details functional problem solving, HEP    Person(s) Educated Patient    Methods Explanation;Demonstration;Handout    Comprehension Verbalized understanding;Returned demonstration;Need further instruction              SLP Short Term Goals - 05/05/21 1304       SLP SHORT TERM GOAL #1   Title Pt will use memory compensations for appointments, medicine management, and other daily activities with occasional min A over 2 sessions    Baseline 04-14-21, 05-05-21    Status Achieved    Target Date 05/02/21       SLP SHORT TERM GOAL #2   Title Pt will demonstrate selective attention to auditory/written instructions for completion of simple-mod complex task given 2 or less repetitions over 2 sessions    Baseline 04-14-21, 05-05-21    Status Achieved    Target Date 05/02/21      SLP SHORT TERM GOAL #3   Title Pt will ID and correct errors on structured cognitive tasks with 80% accuracy given occasional min A over 2 sessions    Baseline 04-14-21, 05-05-21    Status Achieved    Target Date 05/02/21      SLP SHORT TERM GOAL #4   Title Pt will complete simple to mod complex executive functioning tasks with 80% accuracy given occasional min A over 2 sessions    Baseline 05-05-21    Status Partially Met    Target Date 05/02/21      SLP SHORT TERM GOAL #5   Title Pt/caregiver will complete cognitive PROM in first 1-2 sessions    Baseline Cognitive Function=56    Status Achieved    Target Date 05/02/21              SLP Long Term Goals - 06/04/21 1652       SLP LONG TERM GOAL #1   Title Pt will use memory compensations for appointments, medicine management, and other daily activities with rare min A over 2 sessions    Baseline 05-15-21, 05-28-21    Status Achieved      SLP LONG TERM GOAL #2   Title Pt will ID and correct errors on structured cognitive tasks with 90% accuracy given rare min A over 4 sessions    Baseline 05-19-21    Time 8    Period Weeks    Status On-going    Target Date 07/11/21      SLP LONG TERM GOAL #3   Title Pt will complete simple to mod complex executive functioning tasks with 90% accuracy given rare min A over 3 sessions    Baseline 05-19-21,  06-04-21    Time 8    Period Weeks    Status On-going    Target Date 07/11/21      SLP LONG TERM GOAL #4   Title Pt/caregiver will report less frustration and report improved cognitive functioning via PROM by 5 points at last ST session    Time 8    Period Weeks    Status On-going    Target Date 07/11/21      SLP LONG  TERM GOAL #5   Title Pt will identify and correct errors on funtional tasks related to work and home given rare min A over 2 sessions    Time 6    Period Weeks    Status On-going    Target Date 07/11/21              Plan - 06/04/21 Vesper presents to OPST intervention to address cognitive changes s/p contraction of COVID-19. Pt is exhibiting good progress with occasional cues required to aid problem solving, attention to detail, thought processing, and safety awareness. Cues able to faded after therapeutic tasks progressed. Skilled ST is warranted to address cognitive communication skills to improve functional independence and safety as well as reduce caregiver burden and frustration related to cognitive impairment.    Speech Therapy Frequency 1x /week    Duration Other (comment)   6 weeks   Treatment/Interventions Compensatory strategies;Cueing hierarchy;Functional tasks;Patient/family education;Cognitive reorganization;Compensatory techniques;Internal/external aids;SLP instruction and feedback;Environmental controls    Potential to Achieve Goals Good    Potential Considerations Ability to learn/carryover information;Cooperation/participation level    SLP Home Exercise Plan provided    Consulted and Agree with Plan of Care Patient             Patient will benefit from skilled therapeutic intervention in order to improve the following deficits and impairments:   Cognitive communication deficit    Problem List Patient Active Problem List   Diagnosis Date Noted   Enterococcal bacteremia    History of stroke    Prosthetic valve endocarditis ([place])    Bacteremia due to Gram-positive bacteria 02/27/2021   Anemia 02/26/2021   Weakness generalized 02/26/2021   CKD (chronic kidney disease) stage 4, GFR 15-29 ml/min ([place]) 02/26/2021   Atrial fibrillation, chronic (HCC) 02/26/2021   Chronic anticoagulation 02/26/2021   Prolonged QT interval  02/26/2021   Depressive disorder 02/25/2021   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[place]) 02/25/2021   Squamous cell carcinoma of skin of face 02/25/2021   Actinic keratosis 02/25/2021   Anxiety state 02/25/2021   Basal cell carcinoma of nose 02/25/2021   Basal cell carcinoma of skin of unspecified ear and external auricular canal 02/25/2021   Benign prostatic hyperplasia with lower urinary tract symptoms 02/25/2021   Bilateral pseudophakia 02/25/2021   Carcinoma in situ of skin of other parts of face 02/25/2021   Dental caries on smooth surface penetrating into dentin 02/25/2021   Deposits (accretions) on teeth 02/25/2021   Diverticulitis of colon 02/25/2021   Encounter for fitting and adjustment of hearing aid 02/25/2021   Encounter for removal of sutures 02/25/2021   Esophageal reflux 02/25/2021   Generalized osteoarthrosis, involving multiple [name] 02/25/2021   Gout, unspecified 02/25/2021   History of other malignant neoplasm of skin 02/25/2021   Male erectile dysfunction, unspecified 02/25/2021   Peptic ulcer 02/25/2021   Post-traumatic stress disorder, unspecified 02/25/2021   Psychosexual dysfunction with inhibited sexual excitement 02/25/2021   Sensorineural hearing loss, bilateral 02/25/2021   Unspecified  atrial fibrillation ([place]) 02/25/2021   Protein-calorie malnutrition, severe 02/04/2021   Cerebral thrombosis with cerebral infarction 02/02/2021   Right sided weakness 02/01/2021   History of COVID-19 01/29/2021   Memory loss 01/29/2021   Olfactory impairment 01/29/2021   Gait disturbance 01/29/2021   Fatigue 01/29/2021   Physical deconditioning 01/29/2021   Pressure injury of skin 01/07/2021   AKI (acute kidney injury) ([place]) 01/06/2021   Generalized weakness 01/06/2021   ARF (acute renal failure) ([place]) 01/06/2021   Dyspnea 10/05/2020   CHF (congestive heart failure) ([place]) 10/04/2020   Carpal tunnel syndrome of right wrist 09/25/2020   Pain of left hand 09/25/2020   COVID-19 virus  infection 05/24/2020   Hypertensive chronic kidney disease with stage 1 through stage 4 chronic kidney disease, or unspecified chronic kidney disease    Coronary artery disease    Type II diabetes mellitus ([place])    Hypertension    Back pain 04/08/2020   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/05/2019   Pain in right foot 03/22/2018   Cellulitis of left foot 03/01/2018   Pain of joint of left ankle and foot 09/06/2017   Cancer ([place])    Other specified counseling 04/03/2016   Pneumothorax on right 03/29/2016   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/25/2016   Atherosclerotic heart disease of native coronary artery without angina pectoris 03/10/2016   Heart failure ([place]) 03/10/2016   Cataract extraction status of eye 03/10/2016   Other long term (current) drug therapy 03/10/2016   Osteoarthritis of knee 03/10/2016   Vitamin D deficiency 03/10/2016   Mitral regurgitation    Avitaminosis D 12/25/2015   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   Arthritis of knee, degenerative 12/25/2015   H/O cataract extraction 12/25/2015   Atherosclerotic heart disease of native coronary artery with other forms of angina pectoris ([place]) 12/25/2015   Congestive heart failure ([place]) 12/25/2015   Pure hypercholesterolemia 12/25/2015   Allergic rhinitis 12/25/2015   Essential (primary) hypertension 12/25/2015   Hypotension 01/29/2014   Expected Blood loss, postoperative 04/29/2012   Pain due to unicompartmental arthroplasty of knee ([place]) 04/27/2012   Preoperative evaluation of a medical condition to rule out surgical contraindications (TAR required) 83/15/1761   Diastolic CHF, chronic ([place]) 06/29/2011   Preoperative evaluation to rule out surgical contraindication 06/29/2011   CAROTID ARTERY STENOSIS 07/09/2010   TIA 05/29/2010   FATIGUE / MALAISE 05/29/2010   CAD, AUTOLOGOUS BYPASS GRAFT 11/15/2008   MYOCARDIAL INFARCTION 12/18/2007   ALLERGIC RHINITIS WITH CONJUNCTIVITIS 12/18/2007   Type 2 diabetes mellitus  without complications ([place]) 60/73/7106   Other and unspecified hyperlipidemia 12/05/2007   Other ill-defined and unknown causes of morbidity and mortality 12/05/2007   Essential hypertension 12/05/2007   Coronary atherosclerosis 12/05/2007   DEGENERATIVE JOINT DISEASE 12/05/2007   Obstructive sleep apnea of adult 12/05/2007    Alinda Deem, MA CCC-SLP 06/04/2021, 5:37 PM  [facility] [address] [place], [place], [address] Phone: [phone]   Fax:  [phone]   Name: [name] MRN: [id number] Date of Birth: 05-27-1946

## 2021-06-09 ENCOUNTER — Other Ambulatory Visit: Payer: Self-pay | Admitting: *Deleted

## 2021-06-09 NOTE — Patient Outreach (Signed)
[name] Care Management  06/09/2021  MARCELO ICKES 13-Apr-1946 [id number]   Outgoing call placed to member and wife, report he is "good as can be."  Denies any urgent concerns, encouraged to contact this care manager with questions.  Agrees to follow up within the next month.    Goals Addressed             This Visit's Progress    THN - Keep or Improve My Strength-Stroke   On track    Timeframe:  Long-Range Goal Priority:  Medium Start Date:     8/4                        Expected End Date:    11/4                   Barriers: Knowledge    - arrange in-home help services - eat healthy to increase strength - know who to call for help if I fall    Why is this important?   Before the stroke you probably did not think much about being safe when you are up and about.  Now, it may be harder for you to get around.  It may also be easier for you to trip or fall.  It is common to have muscle weakness after a stroke. You may also feel like you cannot control an arm or leg.  It will be helpful to work with a physical therapist to get your strength and muscle control back.  It is good to stay as active as you can. Walking and stretching help you stay strong and flexible.  The physical therapist will develop an exercise program just for you.     Notes:   8/31 - Call placed to [facility], notified that member has completed [facility] PT and will need outpatient PT to continue advancement.  Member has had another fall since discharge, encouraged to use walker and/or cane for more stability.  Wife would like member to attend [facility] PT, call placed, they do not have current order.  Call placed to PCP office to request order be placed.  Provided with [name] fax number [phone])  9/16 - Confirms he has started with outpatient PT.  Report progress in strength  10/17 - Remains active with outpatient therapy.  Member will have shoe insert  for foot drop and [facility].  Also remains active with neuro/memory rehab.  Both will be in place reportedly throughout November     THN - Make and Keep All Appointments   On track    Timeframe:  Short-Term Goal Priority:  High Start Date:      8/4                       Expected End Date:        11/4               Barriers: Knowledge    - ask family or friend for a ride - call to cancel if needed - keep a calendar with appointment dates    Why is this important?   Part of staying healthy is seeing the doctor for follow-up care.  If you forget your appointments, there are some things you can do to stay on track.    Notes:   8/31 - Member has  attended PCP appointment since discharge, was to follow up yesterday but office had to reschedule for next month.  Will follow up with ID today for assessment of infection  9/15 - Has had appointment with ID, no urgent issues noted.  Will see vascular on 9/28, HF clinic on 10/25 and endocrinology on 10/27  10/17 - Reminded member and wife of upcoming appointments with HF clinic and endocrinology.  Report blood sugars has been stable, usually less than 140.  He has not been monitoring HR and BP, reminded to do so and record readings        Valente David, Therapist, sports, MSN [place] Manager [phone]

## 2021-06-11 ENCOUNTER — Other Ambulatory Visit: Payer: Self-pay

## 2021-06-11 ENCOUNTER — Ambulatory Visit: Payer: Medicare Other

## 2021-06-11 DIAGNOSIS — R296 Repeated falls: Secondary | ICD-10-CM | POA: Diagnosis not present

## 2021-06-11 DIAGNOSIS — R41841 Cognitive communication deficit: Secondary | ICD-10-CM | POA: Diagnosis not present

## 2021-06-11 DIAGNOSIS — I69359 Hemiplegia and hemiparesis following cerebral infarction affecting unspecified side: Secondary | ICD-10-CM | POA: Diagnosis not present

## 2021-06-11 DIAGNOSIS — R531 Weakness: Secondary | ICD-10-CM | POA: Diagnosis not present

## 2021-06-11 DIAGNOSIS — R262 Difficulty in walking, not elsewhere classified: Secondary | ICD-10-CM | POA: Diagnosis not present

## 2021-06-11 NOTE — Patient Instructions (Signed)
Write down any episodes or things you forgot as they are happening  Things to try: -Request clarification or rephrasing so you get a second repetition of information  -Make sure you have understand and hear the information. Ask them to repeat if needed -Write down information   Strategies for Improving Your Attention and Memory  Use good eye-contact Give the speaker your undivided attention Look directly at the speaker  Complete one task at a time Avoid multitasking Complete one task before starting a new one Write a note to yourself if you think of something else that needs to be done Let others know when you need quiet time and can't be interrupted Don't answer the phone, texts, or emails while you are working on another task  Put aside distracting thoughts If you find your mind wandering, refocus your attention on the speaker Avoid off-topic comments or responses that may divert your attention  If something important comes to mind, let the speaker know and pause to write yourself a note: "Do you mind holding on a minute, I have to write something down." Put thoughts on hold and focus on salient information  Limit distractions in your environment Think about the environment around you Limit background noise by turning off the TV or music, putting your phone away Close the door and work in quiet  Use active listening Actively participate in the conversation to stay focused Paraphrase what you have heard to include the most important details Adding some associations may help you remember Ask questions to clarify certain points Summarize the speaker's comments periodically Avoid nodding your head and using "mhm" responses as these are more passive and don't help your attention  Alert the other person/people It may be helpful to alert your listener to the fact that you may need reminders to keep on track Tell the speaker in advance that you may need to stop them and have them  repeat salient information If you lose focus, interject and let the person know, "I'm sorry, I lost you, can you tell me again?"  Write down information Write down pertinent information as it comes up, such as telephone numbers, names of people, addresses, details from appointments and conversations, etc.

## 2021-06-11 NOTE — Therapy (Signed)
[facility] [address] [place], [place], [address] Phone: [phone]   Fax:  [phone]  Speech Language Pathology Treatment  Patient Details  Name: [name] MRN: [id number] Date of Birth: 11/07/1945 Referring Provider (SLP): Fenton Foy, NP   Encounter Date: 06/11/2021   End of Session - 06/11/21 1317     Visit Number 12    Number of Visits 17    Date for SLP Re-Evaluation 07/11/21    SLP Start Time 1316    SLP Stop Time  1400    SLP Time Calculation (min) 44 min    Activity Tolerance Patient tolerated treatment well             Past Medical History:  Diagnosis Date   Allergic rhinitis    Anxiety    Atrial fibrillation ([place])    Cancer ([place])    hx of skin cancers    Chronic kidney disease    Coronary artery disease    a. s/p MI & CABG; b. s/p PCI Diag;  c. Cath 12/2011: LAD diff dzs/small, Diag 75% isr, 90 dist to stent (small), LCX & RCA occluded, VG->OM 40, VG->PDA patent - med rx.   COVID-19    x 3   Depression    PTSD   Diabetes mellitus    not on medications   Diastolic CHF, chronic ([place])    a. EF 55-60% by echo 2007   GERD (gastroesophageal reflux disease)    "not anymore" " I had a lap band"   History of diverticulitis of colon    5 YRS AGO   History of pneumonia    2017   History of transient ischemic attack (TIA)    CAROTID DOPPLERS NOV 2011  0-39& BIL. STENOSIS   Hyperlipidemia    Hypertension    Kidney failure    Mitral regurgitation    Myocardial infarction [facility])    Neuromuscular disorder (HCC)    left arm numbness    OA (osteoarthritis)    RIGHT KNEE ARTHOFIBROSIS W/ PAIN  (S/P  REPLACEMENT 2004)   PTSD (post-traumatic stress disorder)    from [place] since 1973   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/25/2016   Complex valvuloplasty including artificial Gore-tex neochord placement x4, plication of anterior commissure, and 26 mm Sorin Memo 3D ring  annuloplasty via right mini thoracotomy approach   Shortness of breath dyspnea    with exertion   Skin cancer    Sleep apnea    cpap- see ov note in EPIC 05/12/11 for settings    Stroke [facility])    hx of   Stroke [facility])     Past Surgical History:  Procedure Laterality Date   AV FISTULA PLACEMENT Left 08/02/2018   Procedure: ARTERIOVENOUS (AV) FISTULA CREATION RADIOCEPHALIC;  Surgeon: Angelia Mould, MD;  Location: [place];  Service: Vascular;  Laterality: Left;   BLEPHAROPLASTY  [place]  2001, 2004, 2009, 2011   CARDIAC CATHETERIZATION N/A 01/23/2016   Procedure: Right/Left Heart Cath and Coronary Angiography;  Surgeon: [name], MD;  Location: [place] CV LAB;  Service: Cardiovascular;  Laterality: N/A;   CARDIOVERSION N/A 09/07/2017   Procedure: CARDIOVERSION;  Surgeon: [name], MD;  Location: [facility] ENDOSCOPY;  Service: Cardiovascular;  Laterality: N/A;   CARDIOVERSION N/A 09/13/2019   Procedure: CARDIOVERSION;  Surgeon: [name], MD;  Location: [place];  Service: Cardiovascular;  Laterality: N/A;  CARDIOVERSION N/A 10/19/2019   Procedure: CARDIOVERSION;  Surgeon: [name], MD;  Location: [facility] ENDOSCOPY;  Service: Cardiovascular;  Laterality: N/A;   CATARACT EXTRACTION W/ INTRAOCULAR LENS IMPLANT Bilateral    CHONDROPLASTY  07/22/2011   Procedure: CHONDROPLASTY;  Surgeon: Dione Plover Aluisio;  Location: [place];  Service: Orthopedics;;   CIRCUMCISION  [place]-- POST CABG   W/ STENT, last cath 01/07/2012    CORONARY ARTERY BYPASS GRAFT  1995   X3 VESSEL   CORONARY ARTERY BYPASS GRAFT  1995   CORONARY STENT PLACEMENT     CYSTOSCOPY/URETEROSCOPY/HOLMIUM LASER/STENT PLACEMENT Right 01/08/2021   Procedure: CYSTOSCOPY/RETROGRADE/URETEROSCOPY/HOLMIUM LASER/STENT PLACEMENT;  Surgeon: Franchot Gallo, MD;  Location: [facility];  Service: Urology;  Laterality:  Right;   IR PERC TUN PERIT CATH WO PORT S&I /IMAG  03/06/2021   IR REMOVAL TUN CV CATH W/O FL  04/14/2021   KNEE ARTHROSCOPY  07/22/2011   Procedure: ARTHROSCOPY KNEE;  Surgeon: Dione Plover Aluisio;  Location: [place];  Service: Orthopedics;  Laterality: Right;  WITH DEBRIDEMENT    KNEE ARTHROSCOPY W/ MENISCECTOMY  X2 IN 2002-- RIGHT KNEE   LAPAROSCOPIC GASTRIC BANDING  01-15-09   LEFT HEART CATH AND CORONARY ANGIOGRAPHY N/A 04/29/2017   Procedure: LEFT HEART CATH AND CORONARY ANGIOGRAPHY;  Surgeon: [name], MD;  Location: [place] CV LAB;  Service: Cardiovascular;  Laterality: N/A;   LEFT HEART CATHETERIZATION WITH CORONARY ANGIOGRAM N/A 09/11/2013   Procedure: LEFT HEART CATHETERIZATION WITH CORONARY ANGIOGRAM;  Surgeon: [name], MD;  Location: [facility] CATH LAB;  Service: Cardiovascular;  Laterality: N/A;   MITRAL VALVE REPAIR Right 03/25/2016   Procedure: MINIMALLY INVASIVE REOPERATION FOR MITRAL VALVE REPAIR  (MVR) with size 26 Sorin Memo 3D;  Surgeon: [name], MD;  Location: [place];  Service: Open Heart Surgery;  Laterality: Right;   MITRAL VALVE REPAIR  03/2016   RIGHT KNEE ED COMPARTMENT REPLACEMENT  2004   SYNOVECTOMY  07/22/2011   Procedure: SYNOVECTOMY;  Surgeon: Dione Plover Aluisio;  Location: [place];  Service: Orthopedics;;   TEE WITHOUT CARDIOVERSION N/A 01/23/2016   Procedure: TRANSESOPHAGEAL ECHOCARDIOGRAM (TEE);  Surgeon: [name], MD;  Location: [place];  Service: Cardiovascular;  Laterality: N/A;   TEE WITHOUT CARDIOVERSION N/A 03/25/2016   Procedure: TRANSESOPHAGEAL ECHOCARDIOGRAM (TEE);  Surgeon: [name], MD;  Location: [place];  Service: Open Heart Surgery;  Laterality: N/A;   TEE WITHOUT CARDIOVERSION N/A 03/04/2021   Procedure: TRANSESOPHAGEAL ECHOCARDIOGRAM (TEE);  Surgeon: Lelon Perla, MD;  Location: [facility] ENDOSCOPY;  Service: Cardiovascular;  Laterality: N/A;   UMBILICAL HERNIA REPAIR  01-15-09   W/  GASTRIC BANDING PROCEDURE    There were no vitals filed for this visit.   Subjective Assessment - 06/11/21 1346     Subjective "My memory and balance are degressing"    Currently in Pain? Yes    Pain Score 6     Pain Location Other (Comment)   all over                  ADULT SLP TREATMENT - 06/11/21 1316       General Information   Behavior/Cognition Alert;Cooperative;Pleasant mood      Treatment Provided   Treatment provided Cognitive-Linquistic      Cognitive-Linquistic Treatment   Treatment focused on Cognition;Patient/family/caregiver education    Skilled Treatment Pt reports memory and balance  is regressing. Pt's wife reportedly repeats herself as pt did not recall information, which is frustrating for patient and wife. SLP suspects attention may be contributing factor to reported memory decline. SLP educated and instructed use of attention strategies via handout. Pt verbalized understanding but reported inabilty to process both verbal and visual stimuli at same time. SLP instructed patient to read instructions and summarize. SLP suggested identify We discussed focusing on eye contact and completing one task at time as part of HEP this week. Pt took necessary phone call and appropriately took notes to aid recall.      Assessment / Recommendations / Plan   Plan Continue with current plan of care     Progression Toward Goals   Progression toward goals Progressing toward goals              SLP Education - 06/11/21 1340     Education Details attention strategies, functional memory goals    Person(s) Educated Patient    Methods Explanation;Demonstration;Handout    Comprehension Verbalized understanding;Returned demonstration;Need further instruction              SLP Short Term Goals - 05/05/21 1304       SLP SHORT TERM GOAL #1   Title Pt will use memory compensations for appointments, medicine management, and other daily activities with occasional min  A over 2 sessions    Baseline 04-14-21, 05-05-21    Status Achieved    Target Date 05/02/21      SLP SHORT TERM GOAL #2   Title Pt will demonstrate selective attention to auditory/written instructions for completion of simple-mod complex task given 2 or less repetitions over 2 sessions    Baseline 04-14-21, 05-05-21    Status Achieved    Target Date 05/02/21      SLP SHORT TERM GOAL #3   Title Pt will ID and correct errors on structured cognitive tasks with 80% accuracy given occasional min A over 2 sessions    Baseline 04-14-21, 05-05-21    Status Achieved    Target Date 05/02/21      SLP SHORT TERM GOAL #4   Title Pt will complete simple to mod complex executive functioning tasks with 80% accuracy given occasional min A over 2 sessions    Baseline 05-05-21    Status Partially Met    Target Date 05/02/21      SLP SHORT TERM GOAL #5   Title Pt/caregiver will complete cognitive PROM in first 1-2 sessions    Baseline Cognitive Function=56    Status Achieved    Target Date 05/02/21              SLP Long Term Goals - 06/11/21 1317       SLP LONG TERM GOAL #1   Title Pt will use memory compensations for appointments, medicine management, and other daily activities with rare min A over 2 sessions    Baseline 05-15-21, 05-28-21    Status Achieved      SLP LONG TERM GOAL #2   Title Pt will ID and correct errors on structured cognitive tasks with 90% accuracy given rare min A over 4 sessions    Baseline 05-19-21    Time 8    Period Weeks    Status On-going    Target Date 07/11/21      SLP LONG TERM GOAL #3   Title Pt will complete simple to mod complex executive functioning tasks with 90% accuracy given rare min A over 3  sessions    Baseline 05-19-21, 06-04-21    Time 8    Period Weeks    Status On-going    Target Date 07/11/21      SLP LONG TERM GOAL #4   Title Pt/caregiver will report less frustration and report improved cognitive functioning via PROM by 5 points at last ST  session    Time 8    Period Weeks    Status On-going    Target Date 07/11/21      SLP LONG TERM GOAL #5   Title Pt will identify and correct errors on funtional tasks related to work and home given rare min A over 2 sessions    Time 6    Period Weeks    Status On-going    Target Date 07/11/21              Plan - 06/11/21 1317     Clinical [place] presents to OPST intervention to address cognitive changes s/p contraction of COVID-19. SLP educated and discussed attention strategies to use at home as SLP suspects reported decline in memory related in part to reduced attention. See skilled treatment for more details. Skilled ST is warranted to address cognitive communication skills to improve functional independence and safety as well as reduce caregiver burden and frustration related to cognitive impairment.    Speech Therapy Frequency 1x /week    Duration Other (comment)   6 weeks   Treatment/Interventions Compensatory strategies;Cueing hierarchy;Functional tasks;Patient/family education;Cognitive reorganization;Compensatory techniques;Internal/external aids;SLP instruction and feedback;Environmental controls    Potential to Achieve Goals Good    Potential Considerations Ability to learn/carryover information;Cooperation/participation level    SLP Home Exercise Plan provided    Consulted and Agree with Plan of Care Patient             Patient will benefit from skilled therapeutic intervention in order to improve the following deficits and impairments:   Cognitive communication deficit    Problem List Patient Active Problem List   Diagnosis Date Noted   Enterococcal bacteremia    History of stroke    Prosthetic valve endocarditis ([place])    Bacteremia due to Gram-positive bacteria 02/27/2021   Anemia 02/26/2021   Weakness generalized 02/26/2021   CKD (chronic kidney disease) stage 4, GFR 15-29 ml/min ([place]) 02/26/2021   Atrial fibrillation, chronic (HCC)  02/26/2021   Chronic anticoagulation 02/26/2021   Prolonged QT interval 02/26/2021   Depressive disorder 02/25/2021   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[place]) 02/25/2021   Squamous cell carcinoma of skin of face 02/25/2021   Actinic keratosis 02/25/2021   Anxiety state 02/25/2021   Basal cell carcinoma of nose 02/25/2021   Basal cell carcinoma of skin of unspecified ear and external auricular canal 02/25/2021   Benign prostatic hyperplasia with lower urinary tract symptoms 02/25/2021   Bilateral pseudophakia 02/25/2021   Carcinoma in situ of skin of other parts of face 02/25/2021   Dental caries on smooth surface penetrating into dentin 02/25/2021   Deposits (accretions) on teeth 02/25/2021   Diverticulitis of colon 02/25/2021   Encounter for fitting and adjustment of hearing aid 02/25/2021   Encounter for removal of sutures 02/25/2021   Esophageal reflux 02/25/2021   Generalized osteoarthrosis, involving multiple sites 02/25/2021   Gout, unspecified 02/25/2021   History of other malignant neoplasm of skin 02/25/2021   Male erectile dysfunction, unspecified 02/25/2021   Peptic ulcer 02/25/2021   Post-traumatic stress disorder, unspecified 02/25/2021   Psychosexual dysfunction with inhibited sexual excitement 02/25/2021   Sensorineural hearing  loss, bilateral 02/25/2021   Unspecified atrial fibrillation ([place]) 02/25/2021   Protein-calorie malnutrition, severe 02/04/2021   Cerebral thrombosis with cerebral infarction 02/02/2021   Right sided weakness 02/01/2021   History of COVID-19 01/29/2021   Memory loss 01/29/2021   Olfactory impairment 01/29/2021   Gait disturbance 01/29/2021   Fatigue 01/29/2021   Physical deconditioning 01/29/2021   Pressure injury of skin 01/07/2021   AKI (acute kidney injury) ([place]) 01/06/2021   Generalized weakness 01/06/2021   ARF (acute renal failure) ([place]) 01/06/2021   Dyspnea 10/05/2020   CHF (congestive heart failure) ([place]) 10/04/2020   Carpal tunnel syndrome  of right wrist 09/25/2020   Pain of left hand 09/25/2020   COVID-19 virus infection 05/24/2020   Hypertensive chronic kidney disease with stage 1 through stage 4 chronic kidney disease, or unspecified chronic kidney disease    Coronary artery disease    Type II diabetes mellitus ([place])    Hypertension    Back pain 04/08/2020   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/05/2019   Pain in right foot 03/22/2018   Cellulitis of left foot 03/01/2018   Pain of joint of left ankle and foot 09/06/2017   Cancer ([place])    Other specified counseling 04/03/2016   Pneumothorax on right 03/29/2016   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/25/2016   Atherosclerotic heart disease of native coronary artery without angina pectoris 03/10/2016   Heart failure ([place]) 03/10/2016   Cataract extraction status of eye 03/10/2016   Other long term (current) drug therapy 03/10/2016   Osteoarthritis of knee 03/10/2016   Vitamin D deficiency 03/10/2016   Mitral regurgitation    Avitaminosis D 12/25/2015   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   Arthritis of knee, degenerative 12/25/2015   H/O cataract extraction 12/25/2015   Atherosclerotic heart disease of native coronary artery with other forms of angina pectoris ([place]) 12/25/2015   Congestive heart failure ([place]) 12/25/2015   Pure hypercholesterolemia 12/25/2015   Allergic rhinitis 12/25/2015   Essential (primary) hypertension 12/25/2015   Hypotension 01/29/2014   Expected Blood loss, postoperative 04/29/2012   Pain due to unicompartmental arthroplasty of knee ([place]) 04/27/2012   Preoperative evaluation of a medical condition to rule out surgical contraindications (TAR required) 75/88/3254   Diastolic CHF, chronic ([name]) 06/29/2011   Preoperative evaluation to rule out surgical contraindication 06/29/2011   CAROTID ARTERY STENOSIS 07/09/2010   TIA 05/29/2010   FATIGUE / MALAISE 05/29/2010   CAD, AUTOLOGOUS BYPASS GRAFT 11/15/2008   MYOCARDIAL INFARCTION 12/18/2007    ALLERGIC RHINITIS WITH CONJUNCTIVITIS 12/18/2007   Type 2 diabetes mellitus without complications ([place]) 98/26/4158   Other and unspecified hyperlipidemia 12/05/2007   Other ill-defined and unknown causes of morbidity and mortality 12/05/2007   Essential hypertension 12/05/2007   Coronary atherosclerosis 12/05/2007   DEGENERATIVE JOINT DISEASE 12/05/2007   Obstructive sleep apnea of adult 12/05/2007    Alinda Deem, MA CCC-SLP 06/11/2021, 2:30 PM  [facility] [address] [place], [place], [address] Phone: [phone]   Fax:  [phone]   Name: [name] MRN: [id number] Date of Birth: May 09, 1946

## 2021-06-17 ENCOUNTER — Encounter (HOSPITAL_COMMUNITY): Payer: Self-pay | Admitting: Cardiology

## 2021-06-17 ENCOUNTER — Other Ambulatory Visit: Payer: Self-pay

## 2021-06-17 ENCOUNTER — Ambulatory Visit (HOSPITAL_COMMUNITY)
Admission: RE | Admit: 2021-06-17 | Discharge: 2021-06-17 | Disposition: A | Discharge status: Home / Self Care | Payer: Medicare Other | Source: Ambulatory Visit | Attending: Cardiology | Admitting: Cardiology

## 2021-06-17 VITALS — BP 122/60 | HR 60 | Wt 188.8 lb

## 2021-06-17 DIAGNOSIS — E7800 Pure hypercholesterolemia, unspecified: Secondary | ICD-10-CM | POA: Diagnosis not present

## 2021-06-17 DIAGNOSIS — I252 Old myocardial infarction: Secondary | ICD-10-CM | POA: Diagnosis not present

## 2021-06-17 DIAGNOSIS — E669 Obesity, unspecified: Secondary | ICD-10-CM | POA: Insufficient documentation

## 2021-06-17 DIAGNOSIS — Z6826 Body mass index (BMI) 26.0-26.9, adult: Secondary | ICD-10-CM | POA: Insufficient documentation

## 2021-06-17 DIAGNOSIS — I770 Arteriovenous fistula, acquired: Secondary | ICD-10-CM | POA: Diagnosis not present

## 2021-06-17 DIAGNOSIS — I480 Paroxysmal atrial fibrillation: Secondary | ICD-10-CM | POA: Insufficient documentation

## 2021-06-17 DIAGNOSIS — I69359 Hemiplegia and hemiparesis following cerebral infarction affecting unspecified side: Secondary | ICD-10-CM | POA: Diagnosis not present

## 2021-06-17 DIAGNOSIS — Z8673 Personal history of transient ischemic attack (TIA), and cerebral infarction without residual deficits: Secondary | ICD-10-CM | POA: Diagnosis not present

## 2021-06-17 DIAGNOSIS — I052 Rheumatic mitral stenosis with insufficiency: Secondary | ICD-10-CM | POA: Insufficient documentation

## 2021-06-17 DIAGNOSIS — Z09 Encounter for follow-up examination after completed treatment for conditions other than malignant neoplasm: Secondary | ICD-10-CM | POA: Insufficient documentation

## 2021-06-17 DIAGNOSIS — M21379 Foot drop, unspecified foot: Secondary | ICD-10-CM | POA: Diagnosis not present

## 2021-06-17 DIAGNOSIS — Z7901 Long term (current) use of anticoagulants: Secondary | ICD-10-CM | POA: Diagnosis not present

## 2021-06-17 DIAGNOSIS — Z79899 Other long term (current) drug therapy: Secondary | ICD-10-CM | POA: Insufficient documentation

## 2021-06-17 DIAGNOSIS — E785 Hyperlipidemia, unspecified: Secondary | ICD-10-CM | POA: Insufficient documentation

## 2021-06-17 DIAGNOSIS — Z8774 Personal history of (corrected) congenital malformations of heart and circulatory system: Secondary | ICD-10-CM | POA: Insufficient documentation

## 2021-06-17 DIAGNOSIS — I2510 Atherosclerotic heart disease of native coronary artery without angina pectoris: Secondary | ICD-10-CM | POA: Diagnosis not present

## 2021-06-17 DIAGNOSIS — E1122 Type 2 diabetes mellitus with diabetic chronic kidney disease: Secondary | ICD-10-CM | POA: Insufficient documentation

## 2021-06-17 DIAGNOSIS — I5032 Chronic diastolic (congestive) heart failure: Secondary | ICD-10-CM | POA: Diagnosis not present

## 2021-06-17 DIAGNOSIS — G4733 Obstructive sleep apnea (adult) (pediatric): Secondary | ICD-10-CM | POA: Insufficient documentation

## 2021-06-17 DIAGNOSIS — I471 Supraventricular tachycardia: Secondary | ICD-10-CM | POA: Diagnosis not present

## 2021-06-17 DIAGNOSIS — I130 Hypertensive heart and chronic kidney disease with heart failure and stage 1 through stage 4 chronic kidney disease, or unspecified chronic kidney disease: Secondary | ICD-10-CM | POA: Diagnosis not present

## 2021-06-17 DIAGNOSIS — N184 Chronic kidney disease, stage 4 (severe): Secondary | ICD-10-CM | POA: Diagnosis not present

## 2021-06-17 DIAGNOSIS — R531 Weakness: Secondary | ICD-10-CM | POA: Diagnosis not present

## 2021-06-17 DIAGNOSIS — Z8616 Personal history of COVID-19: Secondary | ICD-10-CM | POA: Insufficient documentation

## 2021-06-17 DIAGNOSIS — Z8249 Family history of ischemic heart disease and other diseases of the circulatory system: Secondary | ICD-10-CM | POA: Insufficient documentation

## 2021-06-17 DIAGNOSIS — R262 Difficulty in walking, not elsewhere classified: Secondary | ICD-10-CM | POA: Diagnosis not present

## 2021-06-17 DIAGNOSIS — R296 Repeated falls: Secondary | ICD-10-CM | POA: Diagnosis not present

## 2021-06-17 LAB — CBC
HCT: 35.4 % — ABNORMAL LOW (ref 39.0–52.0)
Hemoglobin: 12.1 g/dL — ABNORMAL LOW (ref 13.0–17.0)
MCH: 32.6 pg (ref 26.0–34.0)
MCHC: 34.2 g/dL (ref 30.0–36.0)
MCV: 95.4 fL (ref 80.0–100.0)
Platelets: 198 10*3/uL (ref 150–400)
RBC: 3.71 MIL/uL — ABNORMAL LOW (ref 4.22–5.81)
RDW: 13.3 % (ref 11.5–15.5)
WBC: 7.1 10*3/uL (ref 4.0–10.5)
nRBC: 0 % (ref 0.0–0.2)

## 2021-06-17 LAB — COMPREHENSIVE METABOLIC PANEL
ALT: 15 U/L (ref 0–44)
AST: 40 U/L (ref 15–41)
Albumin: 3.9 g/dL (ref 3.5–5.0)
Alkaline Phosphatase: 83 U/L (ref 38–126)
Anion gap: 10 (ref 5–15)
BUN: 43 mg/dL — ABNORMAL HIGH (ref 8–23)
CO2: 28 mmol/L (ref 22–32)
Calcium: 10.5 mg/dL — ABNORMAL HIGH (ref 8.9–10.3)
Chloride: 99 mmol/L (ref 98–111)
Creatinine, Ser: 3.73 mg/dL — ABNORMAL HIGH (ref 0.61–1.24)
GFR, Estimated: 16 mL/min — ABNORMAL LOW (ref >60)
Glucose, Bld: 158 mg/dL — ABNORMAL HIGH (ref 70–99)
Potassium: 3.8 mmol/L (ref 3.5–5.1)
Sodium: 137 mmol/L (ref 135–145)
Total Bilirubin: 0.9 mg/dL (ref 0.3–1.2)
Total Protein: 6.5 g/dL (ref 6.5–8.1)

## 2021-06-17 LAB — TSH: TSH: 1.61 u[IU]/mL (ref 0.350–4.500)

## 2021-06-17 NOTE — Patient Instructions (Addendum)
EKG done today.  Labs done today. We will contact you only if your labs are abnormal.  No medication changes were made. Please continue all current medications as prescribed.  Your physician recommends that you schedule a follow-up appointment in: 6 months. Please contact our office in March 2023 to schedule a April 2023 appointment.   If you have any questions or concerns before your next appointment please send [place] a message through [place] or call our office at [phone].    TO LEAVE A MESSAGE FOR THE NURSE SELECT OPTION 2, PLEASE LEAVE A MESSAGE INCLUDING: YOUR NAME DATE OF BIRTH CALL BACK NUMBER REASON FOR CALL**this is important as we prioritize the call backs  YOU WILL RECEIVE A CALL BACK THE SAME DAY AS LONG AS YOU CALL BEFORE 4:00 PM   Do the following things EVERYDAY: Weigh yourself in the morning before breakfast. Write it down and keep it in a log. Take your medicines as prescribed Eat low salt foods--Limit salt (sodium) to 2000 mg per day.  Stay as active as you can everyday Limit all fluids for the day to less than 2 liters   At the [facility], you and your health needs are our priority. As part of our continuing mission to provide you with exceptional heart care, we have created designated Provider Care Teams. These Care Teams include your primary Cardiologist (physician) and Advanced Practice Providers (APPs- Physician Assistants and Nurse Practitioners) who all work together to provide you with the care you need, when you need it.   You may see any of the following providers on your designated Care Team at your next follow up: Dr Glori Bickers Dr Haynes Kerns, NP Lyda Jester, [place] Audry Riles, PharmD   Please be sure to bring in all your medications bottles to every appointment.

## 2021-06-18 ENCOUNTER — Ambulatory Visit: Payer: Medicare Other

## 2021-06-18 LAB — LIPID PANEL
Cholesterol: 188 mg/dL (ref 0–200)
HDL: 60 mg/dL (ref >40)
LDL Cholesterol: 104 mg/dL — ABNORMAL HIGH (ref 0–99)
Total CHOL/HDL Ratio: 3.1 RATIO
Triglycerides: 118 mg/dL (ref <150)
VLDL: 24 mg/dL (ref 0–40)

## 2021-06-18 NOTE — Progress Notes (Addendum)
Patient ID: [name], male   DOB: October 31, 1945, 75 y.o.   MRN: [id number] PCP: Dr Harrington Challenger Cardiology: Dr. Aundra Dubin  75 y.o. with history of CAD s/p CABG and obesity s/p lap banding procedure presents for cardiology followup.  Given exertional dyspnea and chest tightness, he had LHC in 1/15.  This showed no change from prior.  Grafts were patent and there was severe stenosis in distal D1, not amenable to PCI.   In 2017, he developed increased exertional dyspnea.  Eventually had a TEE showing severe MR with prolapse of a portion of the anterior mitral valve leaflet.  Coronary angiography in 6/17 showed patent grafts and 90% stenosis of distal diagonal not amenable to intervention.  In 8/17, he had mitral valve repair.  Post-op diastolic CHF, Lasix started and increased.   He had a cath again in 9/18 showing stable coronary anatomy.    He developed atrial arrhythmias in 12/18.  Both fibrillation and flutter were seen.  Saw Dr. Rayann Heman, recommended DCCV with amiodarone or sotalol if fibrillation recurs.  He had DCCV in 1/19.   In 6/19, he was noted to have developed AKI. Creatinine went up to 3.46.  Renal US was done, showing normal kidneys. Cause of AKI was not clear.  We stopped his Lasix. He saw nephrology and eventually went back on Lasix, currently taking 80 mg daily.  He is not using any NSAIDs.    He stopped pravastatin due to myalgias. We struggled to get him on a PCSK9 inhibitor through the [place] but he was finally able to get Praluent.    I had him do a Lexiscan Cardiolite in 11/19, which showed old inferior MI with no ischemia.  Echo in 11/19 showed EF 55-60%, s/p MV repair with mean gradient 6 mmHg. Echo 12/20 showed EF 50-55%, mildly decreased RV systolic function, repaired mitral valve with mild MR, mean gradient 6 mmHg across MV.    He had DCCV from atrial fibrillation to NSR in 1/21 and again in 2/21.   At last appointment, he reported significant fatigue and dyspnea in setting of weight  loss with low BP and HR.  I stopped his Coreg. He felt better with that change.    In 9/21, he had COVID-19 infection but was not hospitalized.   Zio patch in 3/22 showed frequent short atrial tachycardia episodes.  He saw Dr. Rayann Heman, medical therapy recommended (on amiodarone).   He was admitted in 5/22 with AKI from obstruction of right ureter by stone, he was treated by urology.   He was admitted in 6/22 with right-sided weakness and was found to have left cerebral white matter CVA.  He had been compliant with Eliquis, ASA 81 was added.   He was admitted in 7/22 with Enterococcal bacteremia.  TEE showed oscillating density attached to the mitral annulus, ?suture.  However, could not rule out vegetation so had 6 wks of IV abx for SBE.   Echo in 8/22 showed EF 55-60%, moderate focal basal septal hypertrophy, normal RV, severe LAE, s/p mitral valve repair with mean gradient 8 mmHg (mild to moderate mitral stenosis) and mild MR, large PFO, normal IVC.   Today he returns for HF follow up.  Appetite is better and weight is up.  No palpitations.  He tires easily.  Still with right foot drop.  He does not get short of breath walking on flat ground but "wears out."  No chest pain.  No BRBPR/melena.   ECG (personally reviewed): NSR, 1st degree  AVB, LAFB, RBBB  Labs (11/12): LDL 70 Labs (5/13): K 4.1, creatinine 1.1, BNP 48 Labs (12/13): K 3.4, creatinine 1.07, LDL 59, HDL 37 Labs (1/15): K 4.3, creatinine 1.2, HCT 40.3 Labs (3/15): LDL 38, HDL 81, BNP 55, K 4, creatinine 1.2 Labs (6/15): K 4.2, creatinine 1.4, LFTs normal, LDL 66, HDL 35 Labs (8/16): K 4, creatinine 1.6, BNP 64 Labs (12/16): LDL 61, HDL 37 Labs (4/17): K 4.2, creatinine 1.35, HCT 38.7, BNP 80 Labs (8/17): K 4.2, creatinine 1.4 => 1.66, BNP 159 Labs (12/17): K 3.6, creatinine 1.75 Labs (2/18): LDL 76, HDL 36 Labs (9/18): K 3.9, creatinine 1.76 Labs (1/19): K 4.9, creatinine 1.76, hgb 14 Labs (6/19): K 3.6, creatinine  3.46 Labs (7/19): K 4.1, creatinine 3.66, hgb 12.7 Labs (10/19): LDL 177, hgb 13.4, troponin negative, K 4, creatinine 4.15 Labs (12/19): K 4.3, creatinine 3.88 Labs (4/20): K 3.6, creatinine 3.39 Labs (11/20): LDL 73, HDL 38 Labs (2/21): K 3.4, creatinine 3.8 Labs (7/21): K 3.7, creatinine 3.66 Labs (6/22): K 4, creatinine 2.88, LDL 50 Labs (8/22): K 3.6, creatinine 2.79  PMH: 1. OSA: on CPAP.  2. Diabetes mellitus 3. Allergic rhinitis 4. CAD: s/p CABG 1995.  LHC (10/11) with 90% distal D1 (patent proximal D1 stent), total occlusion CFX, total occlusion RCA, no significant stenoses in LAD, SVG-PDA patent, SVG-OM patent, EF 55%.  Medical management.  LHC (5/13) with patent grafts and 90% distal D1 stenosis (no significant change from 10/11).  LHC (1/15) with patent grafts, 75% ISR in D1 stent, 90% stenosis distal D1 (small caliber). D1 not amenable to intervention.  - Coronary angiography (6/17): grafts patent, 90% stenosis distally in large diagonal not amenable to intervention.   - LHC (9/18): SVG-OM and SVG-PDA patent,  75% in-stent restenosis in proximal diagonal then 90% stenosis distally (small in caliber at that point), totally occluded LCx and RCA.  Left coronary system similar to prior caths, D1 may be source of angina.  - Lexiscan Cardiolite (11/19): EF 47%, old inferior MI, no ischemia.  5. Obesity: lap-banding in 5/11.  6. Chronic diastolic CHF: Echo (9/21) with EF 55-60%, moderate LVH, moderate diastolic dysfunction, s/p mitral valve repair with elevated mean gradient but normal pressure half-time.  - Echo (8/18): EF 55-60%, s/p MV repair with mean gradient 6 mmHg, PASP 28 mmHg, mildly decreased RV systolic function.  - Echo (11/19): EF 55-60%, s/p MV repair with mean gradient 6, PASP 42 mmHg, mildly dilated RV, dilated IVC.  - Echo (12/20): EF 50-55%, mildly decreased RV systolic function. S/p MV repair with mean gradient 6 mmHg, mild MR.  - Echo (2/22): EF 45-50%, mild LVH,  moderate RV enlargement with low normal systolic function, s/p mitral valve repair with mean gradient 5 mmHg and trivial MR, mild-moderate AS.  - Echo (8/22): EF 55-60%, moderate focal basal septal hypertrophy, normal RV, severe LAE, s/p mitral valve repair with mean gradient 8 mmHg (mild to moderate mitral stenosis) and mild MR, large PFO, normal IVC. 7. PFO: noted on 8/22 echo.  8. OA: Knees, C-spine. TKR 9/13.  9. HTN 10. Hyperlipidemia: Myalgias with atorvastatin and Crestor. Now on Praluent.  11. Diverticulosis 12. Mitral regurgitation: TEE (6/17) with EF 55-60%, severe eccentric MR, prolapse of a portion of the anterior leaflet, peak RV-RA gradient 45 mmHg.  - Mitral valve repair 8/17 - Echo in 8/22 with repaired MV, mean gradient 8 mmHg (mild-moderate MS), mild MR.  13. CKD: Stage III.  14. Atrial fibrillation/atrial flutter: Both arrhythmias  noted in 12/18.  DCCV 12/19.   - DCCV to NSR 1/21.  - DCCV to NSR 2/21.  15. AKI 6/19 => CKD stage 4: uncertain etiology.  Renal US was normal.  16. Gout 17. COVID-19 infection 9/21 18. 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  - Carotid dopplers (6/22) with mild disease only.  19. Nephrolithiasis.  20. Atrial tachycardia: Noted on 3/22 Zio patch.  21. Aortic stenosis: mild-moderate on 2/22 echo but no significant stenosis on 8/22 echo.  22. 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  TEE 7/22 with oscillating density attached to the mitral annulus, ?suture.  However, could not rule out vegetation so had 6 wks of IV abx for SBE.  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  Lives in [place].   Family History  Problem Relation Age of Onset   Other Mother        joint problems   Alzheimer's disease Mother    Hypertension Father    Heart disease Father    CVA Father        age 13   Depression Father    Heart disease Sister        CABG   Stroke Sister    Heart failure Sister         congestive   Diabetes Sister    Heart disease Brother    Hypertension Brother    Congestive Heart Failure Brother     ROS: All systems reviewed and negative except as per HPI.    Current Outpatient Medications  Medication Sig Dispense Refill   acetaminophen (TYLENOL) 325 MG tablet Take 2 tablets (650 mg total) by mouth every 6 (six) hours as needed for mild pain (or Fever >/= 101).     Alirocumab (PRALUENT) 75 MG/ML SOAJ Inject 75 mg into the skin every 14 (fourteen) days.     allopurinol (ZYLOPRIM) 100 MG tablet Take 0.5 tablets (50 mg total) by mouth 2 (two) times a week. Please discuss dosing with your PCP based on your renal function. 4 tablet 0   amiodarone (PACERONE) 200 MG tablet Take 0.5 tablets (100 mg total) by mouth daily. 60 tablet 0   apixaban (ELIQUIS) 5 MG TABS tablet Take 1 tablet (5 mg total) by mouth 2 (two) times daily. 180 tablet 3   Ascorbic Acid (VITAMIN C WITH ROSE HIPS) 1000 MG tablet Take 1,000 mg by mouth daily.     calcitRIOL (ROCALTROL) 0.25 MCG capsule Take 0.25 mcg by mouth every other day.     Carboxymethylcellulose Sodium 1 % GEL APPLY 1 DROP TO EACH EYE AT BEDTIME     cetirizine (ZYRTEC) 10 MG tablet Take 10 mg by mouth daily.     colchicine 0.6 MG tablet Take 0.6-1.2 mg by mouth 2 (two) times daily as needed (gout).      fluticasone (FLONASE) 50 MCG/ACT nasal spray Place 1 spray into both nostrils daily as needed for allergies or rhinitis.     furosemide (LASIX) 80 MG tablet Take 80 mg by mouth daily.     HYDROcodone-acetaminophen (NORCO/VICODIN) 5-325 MG tablet Take 1-2 tablets by mouth every 4 (four) hours as needed for severe pain. 30 tablet 0   nitroGLYCERIN (NITROSTAT) 0.4 MG SL tablet 1 TAB UNDER THE TONGUE EVERY 5 MINUTES AS NEEDED FOR CHEST PAIN UP TO 3 DOSES, IF PERSIST CALL 911 75 tablet 1   ONETOUCH VERIO test strip 1 each daily.     pantoprazole (PROTONIX) 40 MG tablet  Take 40 mg by mouth daily as needed (heartburn).     potassium chloride SA  (KLOR-CON) 20 MEQ tablet Take 20 mEq by mouth daily.     Semaglutide,0.25 or 0.5MG /DOS, (OZEMPIC, 0.25 OR 0.5 MG/DOSE,) 2 MG/1.5ML SOPN Inject 0.375 mLs (0.5 mg total) into the skin every Thursday. 3 pen 3   tadalafil (CIALIS) 20 MG tablet TAKE 1 TABLET BY MOUTH ONCE DAILY AS NEEDED 10 tablet 0   traZODone (DESYREL) 100 MG tablet Take 100 mg by mouth at bedtime.     No current facility-administered medications for this encounter.    BP 122/60   Pulse 60   Wt 85.6 kg (188 lb 12.8 oz)   SpO2 97%   BMI 26.71 kg/m    Wt Readings from Last 3 Encounters:  06/17/21 85.6 kg (188 lb 12.8 oz)  05/21/21 82.1 kg (181 lb)  04/23/21 83.9 kg (185 lb)   General: NAD Neck: No JVD, no thyromegaly or thyroid nodule.  Lungs: Clear to auscultation bilaterally with normal respiratory effort. CV: Nondisplaced PMI.  Heart regular S1/S2, no S3/S4, 1/6 SEM RUSB.  No peripheral edema.  No carotid bruit.  Normal pedal pulses.  Abdomen: Soft, nontender, no hepatosplenomegaly, no distention.  Skin: Intact without lesions or rashes.  Neurologic: Alert and oriented x 3.  Psych: Normal affect. Extremities: No clubbing or cyanosis.  HEENT: Normal.   Assessment/Plan: 1. S/p mitral valve repair:  Echo 2/22 showed stable mitral valve repair compared to prior echo with trivial MR, mean gradient 5 mmHg.  Echo in 8/22 showed mild-moderate stenosis with mean gradient 8 mmHg and mild MR.  - With prosthetic material, should use antibiotic prophylaxis with dental work.  2. CAD:  He has severe stenosis in a distal D1 that is not amenable to intervention.  This has been stable on last couple of caths. He has occluded RCA and LCx known from the past with SVGs to OM and PDA.  Echo in 2/22 with EF 45-50%. Cardiolite in 11/19 showed no ischemia. Echo in 8/22 with EF up to 55-60%.  No chest pain.  - Currently not on ASA, taking apixaban.  - He cannot tolerate statins with myalgias, Now on Praluent.    3. Hyperlipidemia: Myalgias  with Crestor, pravastatin, and atorvastatin.  We were finally able to get him Praluent through the [place]. LDL 50 in 6/22.  4. Chronic primarily diastolic CHF:  NYHA class II symptoms, he is not volume overloaded on exam.  Echo in 8/22 with EF 55-60%.   - Continue Lasix 80 mg daily.  BMET today.  5. OSA: Continue CPAP.  6. Atrial fibrillation: Paroxysmal, in NSR after DCCV in 2/21.  He is now on amiodarone to maintain NSR given rapid recurrence of AF after DCCV in 1/21.  Probably not a good ablation candidate with size of atria.   - Continue apixaban 5 mg bid. CBC today.  - Continue amiodarone 100 mg daily. Check LFTs, TSH today.  He will need a regular eye exam.  7. CKD stage 4: AKI without recovery, uncertain etiology.  He sees nephrology, there is concern that he is progressing towards HD. He now has AV fistula. Recent creatinine 2.79.  8. Diabetes: He is now followed by endocrinology. 9. Atrial tachycardia: Paroxysmal.  Seen by Dr. Rayann Heman, medical management planned.  Continue amiodarone.  10. CVA: Though to be atrial fibrillation-related.  Carotid dopplers 6/22 with mild stenosis only. However, he was also noted on 8/22 echo to have a PFO.  -  For now, will continue on Eliquis.  11. Aortic stenosis: Mild-moderate on 2/22 echo but minimal on 8/22 echo.   12. Enterococcal bacteremia: In 7/22.  On TEE, it was hard to differentiate between loose suture material and small vegetation.  He was treated for 6 wks with IV abx.   Followup in 6 months.   [name] 06/18/2021

## 2021-06-19 ENCOUNTER — Ambulatory Visit (INDEPENDENT_AMBULATORY_CARE_PROVIDER_SITE_OTHER): Payer: Medicare Other | Admitting: Internal Medicine

## 2021-06-19 ENCOUNTER — Other Ambulatory Visit: Payer: Self-pay

## 2021-06-19 ENCOUNTER — Encounter: Payer: Self-pay | Admitting: Internal Medicine

## 2021-06-19 VITALS — BP 110/62 | HR 56 | Ht 70.5 in | Wt 186.8 lb

## 2021-06-19 DIAGNOSIS — E1165 Type 2 diabetes mellitus with hyperglycemia: Secondary | ICD-10-CM

## 2021-06-19 DIAGNOSIS — E1159 Type 2 diabetes mellitus with other circulatory complications: Secondary | ICD-10-CM

## 2021-06-19 DIAGNOSIS — E669 Obesity, unspecified: Secondary | ICD-10-CM

## 2021-06-19 DIAGNOSIS — I6330 Cerebral infarction due to thrombosis of unspecified cerebral artery: Secondary | ICD-10-CM | POA: Diagnosis not present

## 2021-06-19 DIAGNOSIS — R262 Difficulty in walking, not elsewhere classified: Secondary | ICD-10-CM | POA: Diagnosis not present

## 2021-06-19 DIAGNOSIS — R296 Repeated falls: Secondary | ICD-10-CM | POA: Diagnosis not present

## 2021-06-19 DIAGNOSIS — E782 Mixed hyperlipidemia: Secondary | ICD-10-CM

## 2021-06-19 DIAGNOSIS — I69359 Hemiplegia and hemiparesis following cerebral infarction affecting unspecified side: Secondary | ICD-10-CM | POA: Diagnosis not present

## 2021-06-19 DIAGNOSIS — R531 Weakness: Secondary | ICD-10-CM | POA: Diagnosis not present

## 2021-06-19 LAB — POCT GLYCOSYLATED HEMOGLOBIN (HGB A1C): Hemoglobin A1C: 6.4 % — AB (ref 4.0–5.6)

## 2021-06-19 NOTE — Progress Notes (Signed)
Patient ID: Jason LICHTY, male   DOB: 1945/12/20, 75 y.o.   MRN: [id number]   This visit occurred during the SARS-CoV-2 public health emergency.  Safety protocols were in place, including screening questions prior to the visit, additional usage of staff PPE, and extensive cleaning of exam room while observing appropriate contact time as indicated for disinfecting solutions.   HPI: [name] is a 75 y.o.-year-old male, initially referred by his nephrologist, Dr. 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 fib, CKD stage V, cerebrovascular disease, s/p TIA, ED).  Last visit 4 months ago. He is here with his wife who offers part of the history especially regarding blood sugars, medication doses, and past medical history.  Interim history: He has a stroke 12/2020 - weak R side of body.  He was in physical therapy.  He sees neurology. He has increased urination, blurry vision, nausea, chest pain. He has SOB. He is in PT  -at this today.  He feels fatigued.  Reviewed HbA1c levels: Lab Results  Component Value Date   HGBA1C 6.2 (H) 02/02/2021   HGBA1C 6.3 (H) 01/07/2021   HGBA1C 6.0 (H) 10/05/2020   HGBA1C 5.9 (A) 01/29/2020   HGBA1C 7.1 (A) 09/25/2019   HGBA1C 7.1 (A) 05/23/2019   HGBA1C 8.5 (A) 02/02/2019   HGBA1C 7.2 (H) 03/23/2016   HGBA1C 6.5 (H) 08/20/2012  01/11/2019: HbA1c 8.3% 10/04/2018: HbA1c 8.2%  He is currently on: - Ozempic 0.5 mg weekly >> stopped 04/2020 >> restarted 05/2020 - Glipizide 2.5 to 5 mg before larger dinner  He came off metformin due to worsening kidney function. We stopped glipizide 09/2020 due to good control.  Pt checks his sugars once a day: - am:  96-135, 154 >> 104-155 >> 77, 110-135, 140, 155 - 2h after b'fast: n/c >> 207 >> 90, 110 >> n/c - before lunch: n/c >> 102-130 - 2h after lunch: n/c >> 120-130 >> n/c >> 134, 165 >>  n/c - before dinner: 129 >> n/c >> 86 - 2h after dinner: n/c >> 190 >> n/c  - bedtime: n/c >> 77-122 >> n/c - nighttime: n/c Lowest sugar was 69 >> 104 >> 77; he has hypoglycemia awareness in the 70s. Highest sugar was 299 >> ... 224 >> 170 >> 155.  Pt's meals are: - Breakfast: egg and bacon (not lately).   - Brunch: meat + veggies - Dinner: same as lunch - Snacks: crackers, popcorn   -He has stage V CKD-sees nephrology-did not have to start dialysis yet but has a fistula placed; last BUN/creatinine:  Lab Results  Component Value Date   BUN 43 (H) 06/17/2021   BUN 25 (H) 04/08/2021   CREATININE 3.73 (H) 06/17/2021   CREATININE 2.79 (H) 04/08/2021  01/11/2019: 45//3.79, GFR 16, glucose 149, ACR 52.5 10/04/2018: 32/3.78, GFR 15, glucose 184 He is not on ACE inhibitor/ARB.  -+ HL; last set of lipids: Lab Results  Component Value Date   CHOL 188 06/17/2021   HDL 60 06/17/2021   LDLCALC 104 (H) 06/17/2021   TRIG 118 06/17/2021   CHOLHDL 3.1 06/17/2021  He developed generalized aches and pains from pravastatin.  Now also off Zetia.  He is currently on Praluent.  - last eye exam was in 04-12/2020: [facility] [place] ([facility]).   - no numbness and tingling in his feet.  Pt has FH of DM in sister and brother.  He  has a history of lap band surgery 2010. Lost from 373 lbs to 215-220 lbs. 01/23/2021: B12 vitamin 733, TSH 1.83  He also has hypertension. He has a stroke 12/2020 - weak R side of body, but improving. He was in PT. he started to see neurology. He was admitted 02/01/2021 for chest pain. He had AKI.  ROS: C+ see HPI  I reviewed pt's medications, allergies, PMH, social hx, family hx, and changes were documented in the history of present illness. Otherwise, unchanged from my initial visit note.  Past Medical History:  Diagnosis Date   Allergic rhinitis    Anxiety    Atrial fibrillation (HCC)    Cancer (HCC)    hx of skin cancers    Chronic kidney disease    Coronary  artery disease    a. s/p MI & CABG; b. s/p PCI Diag;  c. Cath 12/2011: LAD diff dzs/small, Diag 75% isr, 90 dist to stent (small), LCX & RCA occluded, VG->OM 40, VG->PDA patent - med rx.   COVID-19    x 3   Depression    PTSD   Diabetes mellitus    not on medications   Diastolic CHF, chronic ([place])    a. EF 55-60% by echo 2007   GERD (gastroesophageal reflux disease)    "not anymore" " I had a lap band"   History of diverticulitis of colon    5 YRS AGO   History of pneumonia    2017   History of transient ischemic attack (TIA)    CAROTID DOPPLERS NOV 2011  0-39& BIL. 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   Complex valvuloplasty including artificial Gore-tex neochord placement x4, plication of anterior commissure, and 26 mm Sorin Memo 3D ring annuloplasty via right mini thoracotomy approach   Shortness of breath dyspnea    with exertion   Skin cancer    Sleep apnea    cpap- see ov note in EPIC 05/12/11 for settings    Stroke [facility])    hx of   Stroke [facility])    Past Surgical History:  Procedure Laterality Date   AV FISTULA PLACEMENT Left 08/02/2018   Procedure: ARTERIOVENOUS (AV) FISTULA CREATION RADIOCEPHALIC;  Surgeon: Angelia Mould, MD;  Location: [place];  Service: Vascular;  Laterality: Left;   BLEPHAROPLASTY  [place]  2001, 2004, 2009, 2011   CARDIAC CATHETERIZATION N/A 01/23/2016   Procedure: Right/Left Heart Cath and Coronary Angiography;  Surgeon: [name], MD;  Location: [place] CV LAB;  Service: Cardiovascular;  Laterality: N/A;   CARDIOVERSION N/A 09/07/2017   Procedure:  CARDIOVERSION;  Surgeon: [name], MD;  Location: [facility] ENDOSCOPY;  Service: Cardiovascular;  Laterality: N/A;   CARDIOVERSION N/A 09/13/2019   Procedure: CARDIOVERSION;  Surgeon: [name], MD;  Location: [facility] ENDOSCOPY;  Service: Cardiovascular;  Laterality: N/A;   CARDIOVERSION N/A 10/19/2019   Procedure: CARDIOVERSION;  Surgeon: [name], MD;  Location: [facility] ENDOSCOPY;  Service: Cardiovascular;  Laterality: N/A;   CATARACT EXTRACTION W/ INTRAOCULAR LENS IMPLANT Bilateral  CHONDROPLASTY  07/22/2011   Procedure: CHONDROPLASTY;  Surgeon: [name];  Location: [place];  Service: Orthopedics;;   CIRCUMCISION  [place]-- POST CABG   W/ STENT, last cath 01/07/2012    CORONARY ARTERY BYPASS GRAFT  1995   X3 VESSEL   CORONARY ARTERY BYPASS GRAFT  1995   CORONARY STENT PLACEMENT     CYSTOSCOPY/URETEROSCOPY/HOLMIUM LASER/STENT PLACEMENT Right 01/08/2021   Procedure: CYSTOSCOPY/RETROGRADE/URETEROSCOPY/HOLMIUM LASER/STENT PLACEMENT;  Surgeon: Franchot Gallo, MD;  Location: [facility];  Service: Urology;  Laterality: Right;   IR PERC TUN PERIT CATH WO PORT S&I /IMAG  03/06/2021   IR REMOVAL TUN CV CATH W/O FL  04/14/2021   KNEE ARTHROSCOPY  07/22/2011   Procedure: ARTHROSCOPY KNEE;  Surgeon: Dione Plover Aluisio;  Location: [place];  Service: Orthopedics;  Laterality: Right;  WITH DEBRIDEMENT    KNEE ARTHROSCOPY W/ MENISCECTOMY  X2 IN 2002-- RIGHT KNEE   LAPAROSCOPIC GASTRIC BANDING  01-15-09   LEFT HEART CATH AND CORONARY ANGIOGRAPHY N/A 04/29/2017   Procedure: LEFT HEART CATH AND CORONARY ANGIOGRAPHY;  Surgeon: [name], MD;  Location: [place] CV LAB;  Service: Cardiovascular;  Laterality: N/A;   LEFT HEART CATHETERIZATION WITH CORONARY ANGIOGRAM N/A 09/11/2013   Procedure: LEFT HEART CATHETERIZATION WITH CORONARY ANGIOGRAM;  Surgeon: [name], MD;  Location: [facility] CATH LAB;  Service:  Cardiovascular;  Laterality: N/A;   MITRAL VALVE REPAIR Right 03/25/2016   Procedure: MINIMALLY INVASIVE REOPERATION FOR MITRAL VALVE REPAIR  (MVR) with size 26 Sorin Memo 3D;  Surgeon: [name], MD;  Location: [place];  Service: Open Heart Surgery;  Laterality: Right;   MITRAL VALVE REPAIR  03/2016   RIGHT KNEE ED COMPARTMENT REPLACEMENT  2004   SYNOVECTOMY  07/22/2011   Procedure: SYNOVECTOMY;  Surgeon: Dione Plover Aluisio;  Location: [place];  Service: Orthopedics;;   TEE WITHOUT CARDIOVERSION N/A 01/23/2016   Procedure: TRANSESOPHAGEAL ECHOCARDIOGRAM (TEE);  Surgeon: [name], MD;  Location: [place];  Service: Cardiovascular;  Laterality: N/A;   TEE WITHOUT CARDIOVERSION N/A 03/25/2016   Procedure: TRANSESOPHAGEAL ECHOCARDIOGRAM (TEE);  Surgeon: [name], MD;  Location: [place];  Service: Open Heart Surgery;  Laterality: N/A;   TEE WITHOUT CARDIOVERSION N/A 03/04/2021   Procedure: TRANSESOPHAGEAL ECHOCARDIOGRAM (TEE);  Surgeon: Lelon Perla, MD;  Location: [facility] ENDOSCOPY;  Service: Cardiovascular;  Laterality: N/A;   UMBILICAL HERNIA REPAIR  01-15-09   W/ GASTRIC BANDING PROCEDURE   Social History   Socioeconomic History   Marital status: Married    Spouse name: Not on file   Number of children: 1   Years of education: Not on file   Highest education level: Not on file  Occupational History   Contractor, semi-retired  Social Needs   Financial resource strain: Not on file   Food insecurity:    Worry: Not on file    Inability: Not on file   Transportation needs:    Medical: Not on file    Non-medical: Not on file  Tobacco Use   Smoking status: Never Smoker   Smokeless tobacco: Never Used  Substance and Sexual Activity   Alcohol use: Yes, liquor    Comment: OCCASIONAL   Drug use: No   Sexual activity: Not on file  Lifestyle   Physical activity:    Days per week: Not on file    Minutes per session: Not  on file   Stress: Not on file   Relationships   Social connections:    Talks on phone: Not on file    Gets together: Not on file    Attends religious service: Not on file    Active member of club or organization: Not on file    Attends meetings of clubs or organizations: Not on file    Relationship status: Not on file   Intimate partner violence:    Fear of current or ex partner: Not on file    Emotionally abused: Not on file    Physically abused: Not on file    Forced sexual activity: Not on file  Other Topics Concern   Not on file  Social History Narrative   Not on file   Current Outpatient Medications on File Prior to Visit  Medication Sig Dispense Refill   acetaminophen (TYLENOL) 325 MG tablet Take 2 tablets (650 mg total) by mouth every 6 (six) hours as needed for mild pain (or Fever >/= 101).     Alirocumab (PRALUENT) 75 MG/ML SOAJ Inject 75 mg into the skin every 14 (fourteen) days.     allopurinol (ZYLOPRIM) 100 MG tablet Take 0.5 tablets (50 mg total) by mouth 2 (two) times a week. Please discuss dosing with your PCP based on your renal function. 4 tablet 0   amiodarone (PACERONE) 200 MG tablet Take 0.5 tablets (100 mg total) by mouth daily. 60 tablet 0   apixaban (ELIQUIS) 5 MG TABS tablet Take 1 tablet (5 mg total) by mouth 2 (two) times daily. 180 tablet 3   Ascorbic Acid (VITAMIN C WITH ROSE HIPS) 1000 MG tablet Take 1,000 mg by mouth daily.     calcitRIOL (ROCALTROL) 0.25 MCG capsule Take 0.25 mcg by mouth every other day.     Carboxymethylcellulose Sodium 1 % GEL APPLY 1 DROP TO EACH EYE AT BEDTIME     cetirizine (ZYRTEC) 10 MG tablet Take 10 mg by mouth daily.     colchicine 0.6 MG tablet Take 0.6-1.2 mg by mouth 2 (two) times daily as needed (gout).      fluticasone (FLONASE) 50 MCG/ACT nasal spray Place 1 spray into both nostrils daily as needed for allergies or rhinitis.     furosemide (LASIX) 80 MG tablet Take 80 mg by mouth daily.     HYDROcodone-acetaminophen (NORCO/VICODIN) 5-325 MG tablet  Take 1-2 tablets by mouth every 4 (four) hours as needed for severe pain. 30 tablet 0   nitroGLYCERIN (NITROSTAT) 0.4 MG SL tablet 1 TAB UNDER THE TONGUE EVERY 5 MINUTES AS NEEDED FOR CHEST PAIN UP TO 3 DOSES, IF PERSIST CALL 911 75 tablet 1   ONETOUCH VERIO test strip 1 each daily.     pantoprazole (PROTONIX) 40 MG tablet Take 40 mg by mouth daily as needed (heartburn).     potassium chloride SA (KLOR-CON) 20 MEQ tablet Take 20 mEq by mouth daily.     Semaglutide,0.25 or 0.5MG /DOS, (OZEMPIC, 0.25 OR 0.5 MG/DOSE,) 2 MG/1.5ML SOPN Inject 0.375 mLs (0.5 mg total) into the skin every Thursday. 3 pen 3   tadalafil (CIALIS) 20 MG tablet TAKE 1 TABLET BY MOUTH ONCE DAILY AS NEEDED 10 tablet 0   traZODone (DESYREL) 100 MG tablet Take 100 mg by mouth at bedtime.     No current facility-administered medications on file prior to visit.   Allergies  Allergen Reactions   Crestor [Rosuvastatin] Other (See Comments)    Muscle aches    Lipitor [Atorvastatin]  Other (See Comments)    Muscle aches   Pravastatin Other (See Comments)    Muscle aches   Carvedilol     Other reaction(s): loss of appetite   Metformin Diarrhea   Serotonin Reuptake Inhibitors (Ssris)     Other reaction(s): Muscle aches   Zocor [Simvastatin]     Other reaction(s): Muscle pain   Codeine Itching and Other (See Comments)    Extremity tingling-- can take synthetic   Family History  Problem Relation Age of Onset   Other Mother        joint problems   Alzheimer's disease Mother    Hypertension Father    Heart disease Father    CVA Father        age 26   Depression Father    Heart disease Sister        CABG   Stroke Sister    Heart failure Sister        congestive   Diabetes Sister    Heart disease Brother    Hypertension Brother    Congestive Heart Failure Brother     PE: BP 110/62 (BP Location: Right Arm, Patient Position: Sitting, Cuff Size: Normal)   Pulse (!) 56   Ht 5' 10.5" (1.791 m)   Wt 186 lb 12.8 oz  (84.7 kg)   SpO2 99%   BMI 26.42 kg/m  Wt Readings from Last 3 Encounters:  06/19/21 186 lb 12.8 oz (84.7 kg)  06/17/21 188 lb 12.8 oz (85.6 kg)  05/21/21 181 lb (82.1 kg)   Constitutional: overweight, in NAD Eyes: PERRLA, EOMI, no exophthalmos ENT: moist mucous membranes, no thyromegaly, no cervical lymphadenopathy Cardiovascular: RRR, No MRG Respiratory: CTA B Gastrointestinal: abdomen soft, NT, ND, BS+ Musculoskeletal: no deformities, strength intact in all 4 Skin: moist, warm, no rashes Neurological: no tremor with outstretched hands, DTR normal in all 4  ASSESSMENT: 1. DM2, non-insulin-dependent, uncontrolled, with long-term complications - CAD, s/p coronaroplasty, then CABG 1995 - cardiologist - Dr. Aundra Dubin - CHF, EF 74 to 65% - Atrial fibrillation, cardioversion in 2019 - Carotid artery disease - Cerebrovascular disease, s/p TIA - CKD stage V, pending dialysis -Dr. Jimmy Footman - ED  2. HL  3.  Obesity class I  PLAN:  1. Patient with longstanding, previously uncontrolled type 2 diabetes, with improved control lately.  He had to come off metformin in the past due to declining kidney function and we also stopped daily sulfonylurea due to good control.  At last visit, sugars were at goal or slightly above goal in the morning and he was not checking sugars later in the day.  I did advise him to try to rotate the sugar checks throughout the day and checking some at that time, also.  We did discuss about possibly taking the glipizide low-dose before a larger dinner if needed.  HbA1c at last visit was excellent, at 6.2%. -At today's visit, sugars remain mostly at goal.  He does have few sugars above goal in the morning and he is not checking them later in the day.  I again advised him about the importance of checking them at different times of the day.  For now, I would not suggest a change in regimen.  He tolerates Ozempic well.  He is only taking glipizide rarely, before a larger  dinner. - I suggested to:  Patient Instructions  Please continue: - Ozempic 0.5 mg weekly   You can take Glipizide 2.5-5 mg before a larger dinner.  Check some  sugars at night, also.  Please return in 4 months with your sugar log.   - we checked his HbA1c: 6.4% (slightly higher) - advised to check sugars at different times of the day - 1x a day, rotating check times - advised for yearly eye exams >> he is UTD - return to clinic in 4 months  2. HL -Reviewed latest lipid panel from 05/2021: LDL above target, the rest of the fractions at goal: Lab Results  Component Value Date   CHOL 188 06/17/2021   HDL 60 06/17/2021   LDLCALC 104 (H) 06/17/2021   TRIG 118 06/17/2021   CHOLHDL 3.1 06/17/2021  -He had side effects from statins in the past, but currently tolerating well Zetia 10 mg daily and Praluent.  3.  Obesity class I -He continues on Ozempic which should also help with weight loss -He lost 22 pounds before last visit!  However, he gained 15 pounds back since then. -He continues physical therapy.  Philemon Kingdom, MD PhD [facility] Endocrinology

## 2021-06-19 NOTE — Patient Instructions (Signed)
Please continue: - Ozempic 0.5 mg weekly   You can take Glipizide 2.5-5 mg before a larger dinner.  Check some sugars at night, also.  Please return in 4 months with your sugar log.

## 2021-06-19 NOTE — Addendum Note (Signed)
Addended by: Lauralyn Primes on: 06/19/2021 03:37 PM   Modules accepted: Orders

## 2021-06-24 ENCOUNTER — Encounter (HOSPITAL_COMMUNITY): Payer: Self-pay | Admitting: *Deleted

## 2021-06-24 DIAGNOSIS — R296 Repeated falls: Secondary | ICD-10-CM | POA: Diagnosis not present

## 2021-06-24 DIAGNOSIS — R531 Weakness: Secondary | ICD-10-CM | POA: Diagnosis not present

## 2021-06-24 DIAGNOSIS — I69359 Hemiplegia and hemiparesis following cerebral infarction affecting unspecified side: Secondary | ICD-10-CM | POA: Diagnosis not present

## 2021-06-24 DIAGNOSIS — R262 Difficulty in walking, not elsewhere classified: Secondary | ICD-10-CM | POA: Diagnosis not present

## 2021-06-25 ENCOUNTER — Ambulatory Visit: Payer: Medicare Other | Attending: Nurse Practitioner

## 2021-06-25 ENCOUNTER — Other Ambulatory Visit: Payer: Self-pay

## 2021-06-25 DIAGNOSIS — R41841 Cognitive communication deficit: Secondary | ICD-10-CM | POA: Diagnosis not present

## 2021-06-25 NOTE — Therapy (Signed)
[facility] [address] [place], [place], [address] Phone: [phone]   Fax:  [phone]  Speech Language Pathology Treatment  Patient Details  Name: [name] MRN: [id number] Date of Birth: 23-Apr-1946 Referring Provider (SLP): Fenton Foy, NP   Encounter Date: 06/25/2021   End of Session - 06/25/21 1304     Visit Number 13    Number of Visits 17    Date for SLP Re-Evaluation 07/11/21    SLP Start Time 1310    SLP Stop Time  1400    SLP Time Calculation (min) 50 min    Activity Tolerance Patient tolerated treatment well             Past Medical History:  Diagnosis Date   Allergic rhinitis    Anxiety    Atrial fibrillation ([place])    Cancer ([place])    hx of skin cancers    Chronic kidney disease    Coronary artery disease    a. s/p MI & CABG; b. s/p PCI Diag;  c. Cath 12/2011: LAD diff dzs/small, Diag 75% isr, 90 dist to stent (small), LCX & RCA occluded, VG->OM 40, VG->PDA patent - med rx.   COVID-19    x 3   Depression    PTSD   Diabetes mellitus    not on medications   Diastolic CHF, chronic ([place])    a. EF 55-60% by echo 2007   GERD (gastroesophageal reflux disease)    "not anymore" " I had a lap band"   History of diverticulitis of colon    5 YRS AGO   History of pneumonia    2017   History of transient ischemic attack (TIA)    CAROTID DOPPLERS NOV 2011  0-39& BIL. STENOSIS   Hyperlipidemia    Hypertension    Kidney failure    Mitral regurgitation    Myocardial infarction [facility])    Neuromuscular disorder (HCC)    left arm numbness    OA (osteoarthritis)    RIGHT KNEE ARTHOFIBROSIS W/ PAIN  (S/P  REPLACEMENT 2004)   PTSD (post-traumatic stress disorder)    from [place] since 1973   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/25/2016   Complex valvuloplasty including artificial Gore-tex neochord placement x4, plication of anterior commissure, and 26 mm Sorin Memo 3D ring  annuloplasty via right mini thoracotomy approach   Shortness of breath dyspnea    with exertion   Skin cancer    Sleep apnea    cpap- see ov note in EPIC 05/12/11 for settings    Stroke [facility])    hx of   Stroke [facility])     Past Surgical History:  Procedure Laterality Date   AV FISTULA PLACEMENT Left 08/02/2018   Procedure: ARTERIOVENOUS (AV) FISTULA CREATION RADIOCEPHALIC;  Surgeon: Angelia Mould, MD;  Location: [place];  Service: Vascular;  Laterality: Left;   BLEPHAROPLASTY  [place]  2001, 2004, 2009, 2011   CARDIAC CATHETERIZATION N/A 01/23/2016   Procedure: Right/Left Heart Cath and Coronary Angiography;  Surgeon: [name], MD;  Location: [place] CV LAB;  Service: Cardiovascular;  Laterality: N/A;   CARDIOVERSION N/A 09/07/2017   Procedure: CARDIOVERSION;  Surgeon: [name], MD;  Location: [facility] ENDOSCOPY;  Service: Cardiovascular;  Laterality: N/A;   CARDIOVERSION N/A 09/13/2019   Procedure: CARDIOVERSION;  Surgeon: [name], MD;  Location: [place];  Service: Cardiovascular;  Laterality: N/A;  CARDIOVERSION N/A 10/19/2019   Procedure: CARDIOVERSION;  Surgeon: [name], MD;  Location: [facility] ENDOSCOPY;  Service: Cardiovascular;  Laterality: N/A;   CATARACT EXTRACTION W/ INTRAOCULAR LENS IMPLANT Bilateral    CHONDROPLASTY  07/22/2011   Procedure: CHONDROPLASTY;  Surgeon: Dione Plover Aluisio;  Location: [place];  Service: Orthopedics;;   CIRCUMCISION  [place]-- POST CABG   W/ STENT, last cath 01/07/2012    CORONARY ARTERY BYPASS GRAFT  1995   X3 VESSEL   CORONARY ARTERY BYPASS GRAFT  1995   CORONARY STENT PLACEMENT     CYSTOSCOPY/URETEROSCOPY/HOLMIUM LASER/STENT PLACEMENT Right 01/08/2021   Procedure: CYSTOSCOPY/RETROGRADE/URETEROSCOPY/HOLMIUM LASER/STENT PLACEMENT;  Surgeon: Franchot Gallo, MD;  Location: [facility];  Service: Urology;  Laterality:  Right;   IR PERC TUN PERIT CATH WO PORT S&I /IMAG  03/06/2021   IR REMOVAL TUN CV CATH W/O FL  04/14/2021   KNEE ARTHROSCOPY  07/22/2011   Procedure: ARTHROSCOPY KNEE;  Surgeon: Dione Plover Aluisio;  Location: [place];  Service: Orthopedics;  Laterality: Right;  WITH DEBRIDEMENT    KNEE ARTHROSCOPY W/ MENISCECTOMY  X2 IN 2002-- RIGHT KNEE   LAPAROSCOPIC GASTRIC BANDING  01-15-09   LEFT HEART CATH AND CORONARY ANGIOGRAPHY N/A 04/29/2017   Procedure: LEFT HEART CATH AND CORONARY ANGIOGRAPHY;  Surgeon: [name], MD;  Location: [place] CV LAB;  Service: Cardiovascular;  Laterality: N/A;   LEFT HEART CATHETERIZATION WITH CORONARY ANGIOGRAM N/A 09/11/2013   Procedure: LEFT HEART CATHETERIZATION WITH CORONARY ANGIOGRAM;  Surgeon: [name], MD;  Location[facility] CATH LAB;  Service: Cardiovascular;  Laterality: N/A;   MITRAL VALVE REPAIR Right 03/25/2016   Procedure: MINIMALLY INVASIVE REOPERATION FOR MITRAL VALVE REPAIR  (MVR) with size 26 Sorin Memo 3D;  Surgeon: [name], MD;  Location: [place];  Service: Open Heart Surgery;  Laterality: Right;   MITRAL VALVE REPAIR  03/2016   RIGHT KNEE ED COMPARTMENT REPLACEMENT  2004   SYNOVECTOMY  07/22/2011   Procedure: SYNOVECTOMY;  Surgeon: Dione Plover Aluisio;  Location: [place];  Service: Orthopedics;;   TEE WITHOUT CARDIOVERSION N/A 01/23/2016   Procedure: TRANSESOPHAGEAL ECHOCARDIOGRAM (TEE);  Surgeon: [name], MD;  Location: [place];  Service: Cardiovascular;  Laterality: N/A;   TEE WITHOUT CARDIOVERSION N/A 03/25/2016   Procedure: TRANSESOPHAGEAL ECHOCARDIOGRAM (TEE);  Surgeon: [name], MD;  Location: [place];  Service: Open Heart Surgery;  Laterality: N/A;   TEE WITHOUT CARDIOVERSION N/A 03/04/2021   Procedure: TRANSESOPHAGEAL ECHOCARDIOGRAM (TEE);  Surgeon: Lelon Perla, MD;  Location: [facility] ENDOSCOPY;  Service: Cardiovascular;  Laterality: N/A;   UMBILICAL HERNIA REPAIR  01-15-09   W/  GASTRIC BANDING PROCEDURE    There were no vitals filed for this visit.   Subjective Assessment - 06/25/21 1308     Subjective "she wanted to ask you some questions"    Patient is accompained by: Family member    Currently in Pain? No/denies                   ADULT SLP TREATMENT - 06/25/21 1304       General Information   Behavior/Cognition Alert;Cooperative;Pleasant mood      Treatment Provided   Treatment provided Cognitive-Linquistic      Cognitive-Linquistic Treatment   Treatment focused on Cognition;Patient/family/caregiver education    Skilled Treatment Pt's wife attended session today. Discussion of current deficits completed, with patient  and wife providing examples. Pt continues to have difficulty with attention, recall, and awareness. SLP generated attention and memory strategies to assist with functional independence at home. SLP suggested establishing attention and allowing extra processing time to aid pt attention and comprehension. SLP educated need to engage in cognitively stimulating tasks to maintain and/or optimize current skills. SLP demonstrated how to appropriately highlight deficits and engage error awareness/problem solving.      Assessment / Recommendations / Plan   Plan Continue with current plan of care      Progression Toward Goals   Progression toward goals Progressing toward goals              SLP Education - 06/25/21 1413     Education Details caregiver ed, compensations    Person(s) Educated Patient;Spouse    Methods Explanation;Demonstration;Handout    Comprehension Verbalized understanding;Returned demonstration;Need further instruction              SLP Short Term Goals - 05/05/21 1304       SLP SHORT TERM GOAL #1   Title Pt will use memory compensations for appointments, medicine management, and other daily activities with occasional min A over 2 sessions    Baseline 04-14-21, 05-05-21    Status Achieved    Target Date  05/02/21      SLP SHORT TERM GOAL #2   Title Pt will demonstrate selective attention to auditory/written instructions for completion of simple-mod complex task given 2 or less repetitions over 2 sessions    Baseline 04-14-21, 05-05-21    Status Achieved    Target Date 05/02/21      SLP SHORT TERM GOAL #3   Title Pt will ID and correct errors on structured cognitive tasks with 80% accuracy given occasional min A over 2 sessions    Baseline 04-14-21, 05-05-21    Status Achieved    Target Date 05/02/21      SLP SHORT TERM GOAL #4   Title Pt will complete simple to mod complex executive functioning tasks with 80% accuracy given occasional min A over 2 sessions    Baseline 05-05-21    Status Partially Met    Target Date 05/02/21      SLP SHORT TERM GOAL #5   Title Pt/caregiver will complete cognitive PROM in first 1-2 sessions    Baseline Cognitive Function=56    Status Achieved    Target Date 05/02/21              SLP Long Term Goals - 06/25/21 1304       SLP LONG TERM GOAL #1   Title Pt will use memory compensations for appointments, medicine management, and other daily activities with rare min A over 2 sessions    Baseline 05-15-21, 05-28-21    Status Achieved      SLP LONG TERM GOAL #2   Title Pt will ID and correct errors on structured cognitive tasks with 90% accuracy given rare min A over 4 sessions    Baseline 05-19-21    Time 8    Period Weeks    Status On-going    Target Date 07/11/21      SLP LONG TERM GOAL #3   Title Pt will complete simple to mod complex executive functioning tasks with 90% accuracy given rare min A over 3 sessions    Baseline 05-19-21, 06-04-21    Time 8    Period Weeks    Status On-going    Target Date 07/11/21  SLP LONG TERM GOAL #4   Title Pt/caregiver will report less frustration and report improved cognitive functioning via PROM by 5 points at last ST session    Time 8    Period Weeks    Status On-going    Target Date 07/11/21       SLP LONG TERM GOAL #5   Title Pt will identify and correct errors on funtional tasks related to work and home given rare min A over 2 sessions    Time 6    Period Weeks    Status On-going    Target Date 07/11/21              Plan - 06/25/21 1304     Clinical Velva presents to OPST intervention to address cognitive changes s/p contraction of COVID-19. SLP educated and instructed how to functionally implement memory and attention strategies at home. Caregiver education provided to wife to assist with daily functioning and reduce frustration. See skilled treatment for more details. Skilled ST is warranted to address cognitive communication skills to improve functional independence and safety as well as reduce caregiver burden and frustration related to cognitive impairment.    Speech Therapy Frequency 1x /week    Duration Other (comment)   6 weeks   Treatment/Interventions Compensatory strategies;Cueing hierarchy;Functional tasks;Patient/family education;Cognitive reorganization;Compensatory techniques;Internal/external aids;SLP instruction and feedback;Environmental controls    Potential to Achieve Goals Good    Potential Considerations Ability to learn/carryover information;Cooperation/participation level    SLP Home Exercise Plan provided    Consulted and Agree with Plan of Care Patient             Patient will benefit from skilled therapeutic intervention in order to improve the following deficits and impairments:   Cognitive communication deficit    Problem List Patient Active Problem List   Diagnosis Date Noted   Enterococcal bacteremia    History of stroke    Prosthetic valve endocarditis ([place])    Bacteremia due to Gram-positive bacteria 02/27/2021   Anemia 02/26/2021   Weakness generalized 02/26/2021   CKD (chronic kidney disease) stage 4, GFR 15-29 ml/min ([place]) 02/26/2021   Atrial fibrillation, chronic (HCC) 02/26/2021   Chronic  anticoagulation 02/26/2021   Prolonged QT interval 02/26/2021   Depressive disorder 02/25/2021   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[place]) 02/25/2021   Squamous cell carcinoma of skin of face 02/25/2021   Actinic keratosis 02/25/2021   Anxiety state 02/25/2021   Basal cell carcinoma of nose 02/25/2021   Basal cell carcinoma of skin of unspecified ear and external auricular canal 02/25/2021   Benign prostatic hyperplasia with lower urinary tract symptoms 02/25/2021   Bilateral pseudophakia 02/25/2021   Carcinoma in situ of skin of other parts of face 02/25/2021   Dental caries on smooth surface penetrating into dentin 02/25/2021   Deposits (accretions) on teeth 02/25/2021   Diverticulitis of colon 02/25/2021   Encounter for fitting and adjustment of hearing aid 02/25/2021   Encounter for removal of sutures 02/25/2021   Esophageal reflux 02/25/2021   Generalized osteoarthrosis, involving multiple sites 02/25/2021   Gout, unspecified 02/25/2021   History of other malignant neoplasm of skin 02/25/2021   Male erectile dysfunction, unspecified 02/25/2021   Peptic ulcer 02/25/2021   Post-traumatic stress disorder, unspecified 02/25/2021   Psychosexual dysfunction with inhibited sexual excitement 02/25/2021   Sensorineural hearing loss, bilateral 02/25/2021   Unspecified atrial fibrillation ([place]) 02/25/2021   Protein-calorie malnutrition, severe 02/04/2021   Cerebral thrombosis with cerebral infarction 02/02/2021   Right sided weakness  02/01/2021   History of COVID-19 01/29/2021   Memory loss 01/29/2021   Olfactory impairment 01/29/2021   Gait disturbance 01/29/2021   Fatigue 01/29/2021   Physical deconditioning 01/29/2021   Pressure injury of skin 01/07/2021   AKI (acute kidney injury) ([place]) 01/06/2021   Generalized weakness 01/06/2021   ARF (acute renal failure) ([name]) 01/06/2021   Dyspnea 10/05/2020   CHF (congestive heart failure) ([place]) 10/04/2020   Carpal tunnel syndrome of right wrist  09/25/2020   Pain of left hand 09/25/2020   COVID-19 virus infection 05/24/2020   Hypertensive chronic kidney disease with stage 1 through stage 4 chronic kidney disease, or unspecified chronic kidney disease    Coronary artery disease    Type II diabetes mellitus ([place])    Hypertension    Back pain 04/08/2020   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/05/2019   Pain in right foot 03/22/2018   Cellulitis of left foot 03/01/2018   Pain of joint of left ankle and foot 09/06/2017   Cancer ([place])    Other specified counseling 04/03/2016   Pneumothorax on right 03/29/2016   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/25/2016   Atherosclerotic heart disease of native coronary artery without angina pectoris 03/10/2016   Heart failure ([place]) 03/10/2016   Cataract extraction status of eye 03/10/2016   Other long term (current) drug therapy 03/10/2016   Osteoarthritis of knee 03/10/2016   Vitamin D deficiency 03/10/2016   Mitral regurgitation    Avitaminosis D 12/25/2015   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   Arthritis of knee, degenerative 12/25/2015   H/O cataract extraction 12/25/2015   Atherosclerotic heart disease of native coronary artery with other forms of angina pectoris ([place]) 12/25/2015   Congestive heart failure ([place]) 12/25/2015   Pure hypercholesterolemia 12/25/2015   Allergic rhinitis 12/25/2015   Essential (primary) hypertension 12/25/2015   Hypotension 01/29/2014   Expected Blood loss, postoperative 04/29/2012   Pain due to unicompartmental arthroplasty of knee ([place]) 04/27/2012   Preoperative evaluation of a medical condition to rule out surgical contraindications (TAR required) 98/28/6751   Diastolic CHF, chronic ([place]) 06/29/2011   Preoperative evaluation to rule out surgical contraindication 06/29/2011   CAROTID ARTERY STENOSIS 07/09/2010   TIA 05/29/2010   FATIGUE / MALAISE 05/29/2010   CAD, AUTOLOGOUS BYPASS GRAFT 11/15/2008   MYOCARDIAL INFARCTION 12/18/2007   ALLERGIC  RHINITIS WITH CONJUNCTIVITIS 12/18/2007   Type 2 diabetes mellitus without complications ([place]) 98/24/2998   Other and unspecified hyperlipidemia 12/05/2007   Other ill-defined and unknown causes of morbidity and mortality 12/05/2007   Essential hypertension 12/05/2007   Coronary atherosclerosis 12/05/2007   DEGENERATIVE JOINT DISEASE 12/05/2007   Obstructive sleep apnea of adult 12/05/2007    Alinda Deem, MA CCC-SLP 06/25/2021, 2:20 PM  [facility] [address] [place], [place], [address] Phone: [phone]   Fax:  [phone]   Name: [name] MRN: [id number] Date of Birth: [birth year]

## 2021-06-25 NOTE — Patient Instructions (Signed)
Establish eye contact and wait 5 seconds before making statement/asking questions  Processing speed, awareness, and attention are the differences now.   Keys/wallet in one location Probation officer?)  Think about tone and how you communicate. You may need to stop and think!

## 2021-07-01 DIAGNOSIS — R262 Difficulty in walking, not elsewhere classified: Secondary | ICD-10-CM | POA: Diagnosis not present

## 2021-07-01 DIAGNOSIS — I69359 Hemiplegia and hemiparesis following cerebral infarction affecting unspecified side: Secondary | ICD-10-CM | POA: Diagnosis not present

## 2021-07-01 DIAGNOSIS — R531 Weakness: Secondary | ICD-10-CM | POA: Diagnosis not present

## 2021-07-01 DIAGNOSIS — R296 Repeated falls: Secondary | ICD-10-CM | POA: Diagnosis not present

## 2021-07-02 ENCOUNTER — Ambulatory Visit (HOSPITAL_COMMUNITY)
Admission: RE | Admit: 2021-07-02 | Discharge: 2021-07-02 | Disposition: A | Discharge status: Home / Self Care | Payer: Medicare Other | Source: Ambulatory Visit | Attending: Internal Medicine | Admitting: Internal Medicine

## 2021-07-02 ENCOUNTER — Ambulatory Visit: Payer: Medicare Other

## 2021-07-02 ENCOUNTER — Other Ambulatory Visit: Payer: Self-pay

## 2021-07-02 DIAGNOSIS — I5022 Chronic systolic (congestive) heart failure: Secondary | ICD-10-CM | POA: Diagnosis not present

## 2021-07-02 DIAGNOSIS — R41841 Cognitive communication deficit: Secondary | ICD-10-CM

## 2021-07-02 LAB — BASIC METABOLIC PANEL
Anion gap: 10 (ref 5–15)
BUN: 45 mg/dL — ABNORMAL HIGH (ref 8–23)
CO2: 28 mmol/L (ref 22–32)
Calcium: 10 mg/dL (ref 8.9–10.3)
Chloride: 100 mmol/L (ref 98–111)
Creatinine, Ser: 3.71 mg/dL — ABNORMAL HIGH (ref 0.61–1.24)
GFR, Estimated: 16 mL/min — ABNORMAL LOW (ref >60)
Glucose, Bld: 126 mg/dL — ABNORMAL HIGH (ref 70–99)
Potassium: 4 mmol/L (ref 3.5–5.1)
Sodium: 138 mmol/L (ref 135–145)

## 2021-07-02 NOTE — Patient Instructions (Signed)
Write down any challenges you experience or forgetfulness you encounter in this next week to discuss with [name] during our last session

## 2021-07-03 NOTE — Therapy (Signed)
[facility] [address] [place], [place], [address] Phone: [phone]   Fax:  [phone]  Speech Language Pathology Treatment  Patient Details  Name: [name] MRN: [id number] Date of Birth: [birth year] Referring Provider (SLP): Fenton Foy, NP   Encounter Date: 07/02/2021   End of Session - 07/02/21 1451     Visit Number 14    Number of Visits 17    Date for SLP Re-Evaluation 07/11/21    SLP Start Time 1451   pt arrived late   SLP Stop Time  32    SLP Time Calculation (min) 39 min    Activity Tolerance Patient tolerated treatment well             Past Medical History:  Diagnosis Date   Allergic rhinitis    Anxiety    Atrial fibrillation ([place])    Cancer ([place])    hx of skin cancers    Chronic kidney disease    Coronary artery disease    a. s/p MI & CABG; b. s/p PCI Diag;  c. Cath 12/2011: LAD diff dzs/small, Diag 75% isr, 90 dist to stent (small), LCX & RCA occluded, VG->OM 40, VG->PDA patent - med rx.   COVID-19    x 3   Depression    PTSD   Diabetes mellitus    not on medications   Diastolic CHF, chronic ([place])    a. EF 55-60% by echo 2007   GERD (gastroesophageal reflux disease)    "not anymore" " I had a lap band"   History of diverticulitis of colon    5 YRS AGO   History of pneumonia    2017   History of transient ischemic attack (TIA)    CAROTID DOPPLERS NOV 2011  0-39& BIL. STENOSIS   Hyperlipidemia    Hypertension    Kidney failure    Mitral regurgitation    Myocardial infarction [facility])    Neuromuscular disorder (HCC)    left arm numbness    OA (osteoarthritis)    RIGHT KNEE ARTHOFIBROSIS W/ PAIN  (S/P  REPLACEMENT 2004)   PTSD (post-traumatic stress disorder)    from [place] since 1973   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/25/2016   Complex valvuloplasty including artificial Gore-tex neochord placement x4, plication of anterior commissure, and 26 mm Sorin Memo  3D ring annuloplasty via right mini thoracotomy approach   Shortness of breath dyspnea    with exertion   Skin cancer    Sleep apnea    cpap- see ov note in EPIC 05/12/11 for settings    Stroke [facility])    hx of   Stroke [facility])     Past Surgical History:  Procedure Laterality Date   AV FISTULA PLACEMENT Left 08/02/2018   Procedure: ARTERIOVENOUS (AV) FISTULA CREATION RADIOCEPHALIC;  Surgeon: Angelia Mould, MD;  Location: [place];  Service: Vascular;  Laterality: Left;   BLEPHAROPLASTY  [place]  2001, 2004, 2009, 2011   CARDIAC CATHETERIZATION N/A 01/23/2016   Procedure: Right/Left Heart Cath and Coronary Angiography;  Surgeon: [name], MD;  Location: [place] CV LAB;  Service: Cardiovascular;  Laterality: N/A;   CARDIOVERSION N/A 09/07/2017   Procedure: CARDIOVERSION;  Surgeon: [name], MD;  Location: [facility] ENDOSCOPY;  Service: Cardiovascular;  Laterality: N/A;   CARDIOVERSION N/A 09/13/2019   Procedure: CARDIOVERSION;  Surgeon: [name], MD;  Location: [place];  Service: Cardiovascular;  Laterality: N/A;   CARDIOVERSION N/A 10/19/2019   Procedure: CARDIOVERSION;  Surgeon: [name], MD;  Location: [facility] ENDOSCOPY;  Service: Cardiovascular;  Laterality: N/A;   CATARACT EXTRACTION W/ INTRAOCULAR LENS IMPLANT Bilateral    CHONDROPLASTY  07/22/2011   Procedure: CHONDROPLASTY;  Surgeon: Dione Plover Aluisio;  Location: [place];  Service: Orthopedics;;   CIRCUMCISION  [place]-- POST CABG   W/ STENT, last cath 01/07/2012    CORONARY ARTERY BYPASS GRAFT  1995   X3 VESSEL   CORONARY ARTERY BYPASS GRAFT  1995   CORONARY STENT PLACEMENT     CYSTOSCOPY/URETEROSCOPY/HOLMIUM LASER/STENT PLACEMENT Right 01/08/2021   Procedure: CYSTOSCOPY/RETROGRADE/URETEROSCOPY/HOLMIUM LASER/STENT PLACEMENT;  Surgeon: Franchot Gallo, MD;  Location: [facility];  Service: Urology;   Laterality: Right;   IR PERC TUN PERIT CATH WO PORT S&I /IMAG  03/06/2021   IR REMOVAL TUN CV CATH W/O FL  04/14/2021   KNEE ARTHROSCOPY  07/22/2011   Procedure: ARTHROSCOPY KNEE;  Surgeon: Dione Plover Aluisio;  Location: [place];  Service: Orthopedics;  Laterality: Right;  WITH DEBRIDEMENT    KNEE ARTHROSCOPY W/ MENISCECTOMY  X2 IN 2002-- RIGHT KNEE   LAPAROSCOPIC GASTRIC BANDING  01-15-09   LEFT HEART CATH AND CORONARY ANGIOGRAPHY N/A 04/29/2017   Procedure: LEFT HEART CATH AND CORONARY ANGIOGRAPHY;  Surgeon: [name], MD;  Location: [place] CV LAB;  Service: Cardiovascular;  Laterality: N/A;   LEFT HEART CATHETERIZATION WITH CORONARY ANGIOGRAM N/A 09/11/2013   Procedure: LEFT HEART CATHETERIZATION WITH CORONARY ANGIOGRAM;  Surgeon: [name], MD;  [facility] CATH LAB;  Service: Cardiovascular;  Laterality: N/A;   MITRAL VALVE REPAIR Right 03/25/2016   Procedure: MINIMALLY INVASIVE REOPERATION FOR MITRAL VALVE REPAIR  (MVR) with size 26 Sorin Memo 3D;  Surgeon: [name], MD;  Location: [place];  Service: Open Heart Surgery;  Laterality: Right;   MITRAL VALVE REPAIR  03/2016   RIGHT KNEE ED COMPARTMENT REPLACEMENT  2004   SYNOVECTOMY  07/22/2011   Procedure: SYNOVECTOMY;  Surgeon: Dione Plover Aluisio;  Location: [place];  Service: Orthopedics;;   TEE WITHOUT CARDIOVERSION N/A 01/23/2016   Procedure: TRANSESOPHAGEAL ECHOCARDIOGRAM (TEE);  Surgeon: [name], MD;  Location: [place];  Service: Cardiovascular;  Laterality: N/A;   TEE WITHOUT CARDIOVERSION N/A 03/25/2016   Procedure: TRANSESOPHAGEAL ECHOCARDIOGRAM (TEE);  Surgeon: [name], MD;  Location: [place];  Service: Open Heart Surgery;  Laterality: N/A;   TEE WITHOUT CARDIOVERSION N/A 03/04/2021   Procedure: TRANSESOPHAGEAL ECHOCARDIOGRAM (TEE);  Surgeon: Lelon Perla, MD;  Location: [facility] ENDOSCOPY;  Service: Cardiovascular;  Laterality: N/A;   UMBILICAL HERNIA REPAIR  01-15-09    W/ GASTRIC BANDING PROCEDURE    There were no vitals filed for this visit.   Subjective Assessment - 07/03/21 0811     Subjective "everything has been fine"    Patient is accompained by: Family member    Currently in Pain? No/denies                   ADULT SLP TREATMENT - 07/02/21 1452       General Information   Behavior/Cognition Alert;Cooperative;Pleasant mood      Treatment Provided   Treatment provided Cognitive-Linquistic      Cognitive-Linquistic Treatment   Treatment focused on Cognition;Patient/family/caregiver education    Skilled Treatment Late arrival correlated to traffic. Pt arrived alone. Pt reported success  with recent job scenarios with no errors or challenges reported. Pt returned with homework completed, with errors noted x5. Mod A required to ID errors with rare cues required to correct. SLP again reiterated double checking to look for errors versus affirm answers. Pt verbalized understanding. SLP provided additional recommendations to utilize at home to optimize cognitive functioning.      Assessment / Recommendations / Plan   Plan Continue with current plan of care      Progression Toward Goals   Progression toward goals Progressing toward goals              SLP Education - 07/02/21 1515     Education Details double check for errors not to affirm your answers    Person(s) Educated Patient    Methods Explanation;Demonstration;Handout    Comprehension Verbalized understanding;Returned demonstration;Need further instruction              SLP Short Term Goals - 05/05/21 1304       SLP SHORT TERM GOAL #1   Title Pt will use memory compensations for appointments, medicine management, and other daily activities with occasional min A over 2 sessions    Baseline 04-14-21, 05-05-21    Status Achieved    Target Date 05/02/21      SLP SHORT TERM GOAL #2   Title Pt will demonstrate selective attention to auditory/written instructions for  completion of simple-mod complex task given 2 or less repetitions over 2 sessions    Baseline 04-14-21, 05-05-21    Status Achieved    Target Date 05/02/21      SLP SHORT TERM GOAL #3   Title Pt will ID and correct errors on structured cognitive tasks with 80% accuracy given occasional min A over 2 sessions    Baseline 04-14-21, 05-05-21    Status Achieved    Target Date 05/02/21      SLP SHORT TERM GOAL #4   Title Pt will complete simple to mod complex executive functioning tasks with 80% accuracy given occasional min A over 2 sessions    Baseline 05-05-21    Status Partially Met    Target Date 05/02/21      SLP SHORT TERM GOAL #5   Title Pt/caregiver will complete cognitive PROM in first 1-2 sessions    Baseline Cognitive Function=56    Status Achieved    Target Date 05/02/21              SLP Long Term Goals - 07/02/21 1517       SLP LONG TERM GOAL #1   Title Pt will use memory compensations for appointments, medicine management, and other daily activities with rare min A over 2 sessions    Baseline 05-15-21, 05-28-21    Status Achieved      SLP LONG TERM GOAL #2   Title Pt will ID and correct errors on structured cognitive tasks with 90% accuracy given rare min A over 4 sessions    Baseline 05-19-21    Time 8    Period Weeks    Status On-going    Target Date 07/11/21      SLP LONG TERM GOAL #3   Title Pt will complete simple to mod complex executive functioning tasks with 90% accuracy given rare min A over 3 sessions    Baseline 05-19-21, 06-04-21    Time 8    Period Weeks    Status On-going      SLP LONG TERM GOAL #4   Title  Pt/caregiver will report less frustration and report improved cognitive functioning via PROM by 5 points at last ST session    Time 8    Period Weeks    Status On-going    Target Date 07/11/21      SLP LONG TERM GOAL #5   Title Pt will identify and correct errors on funtional tasks related to work and home given rare min A over 2 sessions     Baseline 07-02-21    Time 6    Period Weeks    Status On-going    Target Date 07/11/21              Plan - 07/02/21 1517     Clinical Malak[place] presents to OPST intervention to address cognitive changes s/p contraction of COVID-19. SLP educated and instructed how to functionally implement memory and attention strategies at home. See skilled treatment for more details. Skilled ST is warranted to address cognitive communication skills to improve functional independence and safety as well as reduce caregiver burden and frustration related to cognitive impairment.    Speech Therapy Frequency 1x /week    Duration Other (comment)   6 weeks   Treatment/Interventions Compensatory strategies;Cueing hierarchy;Functional tasks;Patient/family education;Cognitive reorganization;Compensatory techniques;Internal/external aids;SLP instruction and feedback;Environmental controls    Potential to Achieve Goals Good    Potential Considerations Ability to learn/carryover information;Cooperation/participation level    SLP Home Exercise Plan provided    Consulted and Agree with Plan of Care Patient             Patient will benefit from skilled therapeutic intervention in order to improve the following deficits and impairments:   Cognitive communication deficit    Problem List Patient Active Problem List   Diagnosis Date Noted   Enterococcal bacteremia    History of stroke    Prosthetic valve endocarditis ([place])    Bacteremia due to Gram-positive bacteria 02/27/2021   Anemia 02/26/2021   Weakness generalized 02/26/2021   CKD (chronic kidney disease) stage 4, GFR 15-29 ml/min ([place]) 02/26/2021   Atrial fibrillation, chronic (HCC) 02/26/2021   Chronic anticoagulation 02/26/2021   Prolonged QT interval 02/26/2021   Depressive disorder 02/25/2021   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[place]) 02/25/2021   Squamous cell carcinoma of skin of face 02/25/2021   Actinic keratosis 02/25/2021   Anxiety  state 02/25/2021   Basal cell carcinoma of nose 02/25/2021   Basal cell carcinoma of skin of unspecified ear and external auricular canal 02/25/2021   Benign prostatic hyperplasia with lower urinary tract symptoms 02/25/2021   Bilateral pseudophakia 02/25/2021   Carcinoma in situ of skin of other parts of face 02/25/2021   Dental caries on smooth surface penetrating into dentin 02/25/2021   Deposits (accretions) on teeth 02/25/2021   Diverticulitis of colon 02/25/2021   Encounter for fitting and adjustment of hearing aid 02/25/2021   Encounter for removal of sutures 02/25/2021   Esophageal reflux 02/25/2021   Generalized osteoarthrosis, involving multiple sites 02/25/2021   Gout, unspecified 02/25/2021   History of other malignant neoplasm of skin 02/25/2021   Male erectile dysfunction, unspecified 02/25/2021   Peptic ulcer 02/25/2021   Post-traumatic stress disorder, unspecified 02/25/2021   Psychosexual dysfunction with inhibited sexual excitement 02/25/2021   Sensorineural hearing loss, bilateral 02/25/2021   Unspecified atrial fibrillation ([place]) 02/25/2021   Protein-calorie malnutrition, severe 02/04/2021   Cerebral thrombosis with cerebral infarction 02/02/2021   Right sided weakness 02/01/2021   History of COVID-19 01/29/2021   Memory loss 01/29/2021   Olfactory impairment  01/29/2021   Gait disturbance 01/29/2021   Fatigue 01/29/2021   Physical deconditioning 01/29/2021   Pressure injury of skin 01/07/2021   AKI (acute kidney injury) ([place]) 01/06/2021   Generalized weakness 01/06/2021   ARF (acute renal failure) ([place]) 01/06/2021   Dyspnea 10/05/2020   CHF (congestive heart failure) ([place]) 10/04/2020   Carpal tunnel syndrome of right wrist 09/25/2020   Pain of left hand 09/25/2020   COVID-19 virus infection 05/24/2020   Hypertensive chronic kidney disease with stage 1 through stage 4 chronic kidney disease, or unspecified chronic kidney disease    Coronary artery disease     Type II diabetes mellitus ([place])    Hypertension    Back pain 04/08/2020   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/05/2019   Pain in right foot 03/22/2018   Cellulitis of left foot 03/01/2018   Pain of joint of left ankle and foot 09/06/2017   Cancer ([place])    Other specified counseling 04/03/2016   Pneumothorax on right 03/29/2016   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/25/2016   Atherosclerotic heart disease of native coronary artery without angina pectoris 03/10/2016   Heart failure ([place]) 03/10/2016   Cataract extraction status of eye 03/10/2016   Other long term (current) drug therapy 03/10/2016   Osteoarthritis of knee 03/10/2016   Vitamin D deficiency 03/10/2016   Mitral regurgitation    Avitaminosis D 12/25/2015   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   Arthritis of knee, degenerative 12/25/2015   H/O cataract extraction 12/25/2015   Atherosclerotic heart disease of native coronary artery with other forms of angina pectoris ([place]) 12/25/2015   Congestive heart failure ([place]) 12/25/2015   Pure hypercholesterolemia 12/25/2015   Allergic rhinitis 12/25/2015   Essential (primary) hypertension 12/25/2015   Hypotension 01/29/2014   Expected Blood loss, postoperative 04/29/2012   Pain due to unicompartmental arthroplasty of knee ([place]) 04/27/2012   Preoperative evaluation of a medical condition to rule out surgical contraindications (TAR required) 13/03/6577   Diastolic CHF, chronic ([place]) 06/29/2011   Preoperative evaluation to rule out surgical contraindication 06/29/2011   CAROTID ARTERY STENOSIS 07/09/2010   TIA 05/29/2010   FATIGUE / MALAISE 05/29/2010   CAD, AUTOLOGOUS BYPASS GRAFT 11/15/2008   MYOCARDIAL INFARCTION 12/18/2007   ALLERGIC RHINITIS WITH CONJUNCTIVITIS 12/18/2007   Type 2 diabetes mellitus without complications ([place]) 46/96/2952   Other and unspecified hyperlipidemia 12/05/2007   Other ill-defined and unknown causes of morbidity and mortality 12/05/2007   Essential  hypertension 12/05/2007   Coronary atherosclerosis 12/05/2007   DEGENERATIVE JOINT DISEASE 12/05/2007   Obstructive sleep apnea of adult 12/05/2007    Alinda Deem, MA CCC-SLP 07/03/2021, 8:13 AM  [facility] [address] [place], [place], [address] Phone: [phone]   Fax:  [phone]   Name: [name] MRN: [id number] Date of Birth: 01-Apr-1946

## 2021-07-07 ENCOUNTER — Other Ambulatory Visit: Payer: Self-pay | Admitting: *Deleted

## 2021-07-07 NOTE — Patient Outreach (Signed)
[facility] Care Management  07/07/2021  JALON SQUIER 02-02-1946 [id number]   Outgoing call placed to member/wife, no answer, HIPAA compliant voice message left.  Will follow up within the next 3-4 business days.  Valente David, [place], MSN [place] [phone]

## 2021-07-08 DIAGNOSIS — R296 Repeated falls: Secondary | ICD-10-CM | POA: Diagnosis not present

## 2021-07-08 DIAGNOSIS — R262 Difficulty in walking, not elsewhere classified: Secondary | ICD-10-CM | POA: Diagnosis not present

## 2021-07-08 DIAGNOSIS — R531 Weakness: Secondary | ICD-10-CM | POA: Diagnosis not present

## 2021-07-08 DIAGNOSIS — I69359 Hemiplegia and hemiparesis following cerebral infarction affecting unspecified side: Secondary | ICD-10-CM | POA: Diagnosis not present

## 2021-07-09 ENCOUNTER — Other Ambulatory Visit: Payer: Self-pay

## 2021-07-09 ENCOUNTER — Ambulatory Visit: Payer: Medicare Other

## 2021-07-09 DIAGNOSIS — R41841 Cognitive communication deficit: Secondary | ICD-10-CM

## 2021-07-09 NOTE — Therapy (Signed)
[facility] [address] [place] [address], [place], [address] Phone: [phone]   Fax:  [phone]  Speech Language Pathology Treatment/Discharge Summary  Patient Details  Name: [name] MRN: [id number] Date of Birth: 1946/04/02 Referring Provider (SLP): Fenton Foy, NP   Encounter Date: 07/09/2021   End of Session - 07/09/21 1324     Visit Number 15    Number of Visits 17    Date for SLP Re-Evaluation 07/11/21    SLP Start Time 9702    SLP Stop Time  1351    SLP Time Calculation (min) 34 min    Activity Tolerance Patient tolerated treatment well             Past Medical History:  Diagnosis Date   Allergic rhinitis    Anxiety    Atrial fibrillation ([place])    Cancer ([place])    hx of skin cancers    Chronic kidney disease    Coronary artery disease    a. s/p MI & CABG; b. s/p PCI Diag;  c. Cath 12/2011: LAD diff dzs/small, Diag 75% isr, 90 dist to stent (small), LCX & RCA occluded, VG->OM 40, VG->PDA patent - med rx.   COVID-19    x 3   Depression    PTSD   Diabetes mellitus    not on medications   Diastolic CHF, chronic ([place])    a. EF 55-60% by echo 2007   GERD (gastroesophageal reflux disease)    "not anymore" " I had a lap band"   History of diverticulitis of colon    5 YRS AGO   History of pneumonia    2017   History of transient ischemic attack (TIA)    CAROTID DOPPLERS NOV 2011  0-39& BIL. STENOSIS   Hyperlipidemia    Hypertension    Kidney failure    Mitral regurgitation    Myocardial infarction [facility])    Neuromuscular disorder (HCC)    left arm numbness    OA (osteoarthritis)    RIGHT KNEE ARTHOFIBROSIS W/ PAIN  (S/P  REPLACEMENT 2004)   PTSD (post-traumatic stress disorder)    from [place] since 1973   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/25/2016   Complex valvuloplasty including artificial Gore-tex neochord placement x4, plication of anterior commissure, and 26 mm Sorin  Memo 3D ring annuloplasty via right mini thoracotomy approach   Shortness of breath dyspnea    with exertion   Skin cancer    Sleep apnea    cpap- see ov note in EPIC 05/12/11 for settings    Stroke [facility])    hx of   Stroke [facility])     Past Surgical History:  Procedure Laterality Date   AV FISTULA PLACEMENT Left 08/02/2018   Procedure: ARTERIOVENOUS (AV) FISTULA CREATION RADIOCEPHALIC;  Surgeon: Angelia Mould, MD;  Location: [facility];  Service: Vascular;  Laterality: Left;   BLEPHAROPLASTY  [place]  2001, 2004, 2009, 2011   CARDIAC CATHETERIZATION N/A 01/23/2016   Procedure: Right/Left Heart Cath and Coronary Angiography;  Surgeon: [name], MD;  Location: [place] CV LAB;  Service: Cardiovascular;  Laterality: N/A;   CARDIOVERSION N/A 09/07/2017   Procedure: CARDIOVERSION;  Surgeon: [name], MD;  Location: [facility] ENDOSCOPY;  Service: Cardiovascular;  Laterality: N/A;   CARDIOVERSION N/A 09/13/2019   Procedure: CARDIOVERSION;  Surgeon: [name], MD;  Location: [facility];  Service: Cardiovascular;  Laterality: N/A;  CARDIOVERSION N/A 10/19/2019   Procedure: CARDIOVERSION;  Surgeon: [name], MD;  Location: [facility] ENDOSCOPY;  Service: Cardiovascular;  Laterality: N/A;   CATARACT EXTRACTION W/ INTRAOCULAR LENS IMPLANT Bilateral    CHONDROPLASTY  07/22/2011   Procedure: CHONDROPLASTY;  Surgeon: Dione Plover Aluisio;  Location: [place];  Service: Orthopedics;;   CIRCUMCISION  [facility]-- POST CABG   W/ STENT, last cath 01/07/2012    CORONARY ARTERY BYPASS GRAFT  1995   X3 VESSEL   CORONARY ARTERY BYPASS GRAFT  1995   CORONARY STENT PLACEMENT     CYSTOSCOPY/URETEROSCOPY/HOLMIUM LASER/STENT PLACEMENT Right 01/08/2021   Procedure: CYSTOSCOPY/RETROGRADE/URETEROSCOPY/HOLMIUM LASER/STENT PLACEMENT;  Surgeon: Franchot Gallo, MD;  Location: [facility];  Service: Urology;   Laterality: Right;   IR PERC TUN PERIT CATH WO PORT S&I /IMAG  03/06/2021   IR REMOVAL TUN CV CATH W/O FL  04/14/2021   KNEE ARTHROSCOPY  07/22/2011   Procedure: ARTHROSCOPY KNEE;  Surgeon: Dione Plover Aluisio;  Location: [facility];  Service: Orthopedics;  Laterality: Right;  WITH DEBRIDEMENT    KNEE ARTHROSCOPY W/ MENISCECTOMY  X2 IN 2002-- RIGHT KNEE   LAPAROSCOPIC GASTRIC BANDING  01-15-09   LEFT HEART CATH AND CORONARY ANGIOGRAPHY N/A 04/29/2017   Procedure: LEFT HEART CATH AND CORONARY ANGIOGRAPHY;  Surgeon: [name], MD;  Location: [facility] CV LAB;  Service: Cardiovascular;  Laterality: N/A;   LEFT HEART CATHETERIZATION WITH CORONARY ANGIOGRAM N/A 09/11/2013   Procedure: LEFT HEART CATHETERIZATION WITH CORONARY ANGIOGRAM;  Surgeon: [name], MD;  Location: [facility] CATH LAB;  Service: Cardiovascular;  Laterality: N/A;   MITRAL VALVE REPAIR Right 03/25/2016   Procedure: MINIMALLY INVASIVE REOPERATION FOR MITRAL VALVE REPAIR  (MVR) with size 26 Sorin Memo 3D;  Surgeon: [name], MD;  Location: [place];  Service: Open Heart Surgery;  Laterality: Right;   MITRAL VALVE REPAIR  03/2016   RIGHT KNEE ED COMPARTMENT REPLACEMENT  2004   SYNOVECTOMY  07/22/2011   Procedure: SYNOVECTOMY;  Surgeon: Dione Plover Aluisio;  Location: [place];  Service: Orthopedics;;   TEE WITHOUT CARDIOVERSION N/A 01/23/2016   Procedure: TRANSESOPHAGEAL ECHOCARDIOGRAM (TEE);  Surgeon: [name], MD;  Location: [place];  Service: Cardiovascular;  Laterality: N/A;   TEE WITHOUT CARDIOVERSION N/A 03/25/2016   Procedure: TRANSESOPHAGEAL ECHOCARDIOGRAM (TEE);  Surgeon: [name], MD;  Location: [place];  Service: Open Heart Surgery;  Laterality: N/A;   TEE WITHOUT CARDIOVERSION N/A 03/04/2021   Procedure: TRANSESOPHAGEAL ECHOCARDIOGRAM (TEE);  Surgeon: Lelon Perla, MD;  Location: [facility] ENDOSCOPY;  Service: Cardiovascular;  Laterality: N/A;   UMBILICAL HERNIA REPAIR  01-15-09    W/ GASTRIC BANDING PROCEDURE    There were no vitals filed for this visit.   Subjective Assessment - 07/09/21 1320     Subjective "I wrapped up PT yesterday"    Currently in Pain? Yes    Pain Score 9     Pain Location Leg              SPEECH THERAPY DISCHARGE SUMMARY  Visits from Start of Care: 15  Current functional level related to goals / functional outcomes: [place] exhibited good progress and improvements in cognitive linguistic functioning, including improved attention to detail, problem solving, recall, and error awareness. Education and training completed with patient and wife for cognitive strategies, compensations, and recommendations for daily functioning. Pt believes he has maximized rehab  potential at this time and verbalized agreement with ST discharge this date.    Remaining deficits: Mild cognitive deficits   Education / Equipment: Attention to detail strategies, memory/attention compensations, caregiver education    Patient agrees to discharge. Patient goals were met. Patient is being discharged due to meeting the stated rehab goals..       ADULT SLP TREATMENT - 07/09/21 1320       General Information   Behavior/Cognition Alert;Cooperative;Pleasant mood      Treatment Provided   Treatment provided Cognitive-Linquistic      Cognitive-Linquistic Treatment   Treatment focused on Cognition;Patient/family/caregiver education    Skilled Treatment Pt verbalized he sometimes forgets phone at home, in which he is compensating by putting phone in one location. SLP also suggested written external aid. Pt returned with functional calcuations completed. Pt achieved 92% accuracy. SLP min A required to ID errors. Pt benefited from re-reading to aid error awareness. SLP again re-educated slowing down, re-reading, and double checking, especially work related tasks. Pt verbalized understanding. Pt believes he has maximized rehab potential at this time and requested ST  discharge this date. Pt exhibited significant improvements in attention and awareness since beginning ST intervention. Cognitive Function PROM completed with 41 point improvement.      Assessment / Recommendations / Plan   Plan Discharge SLP treatment due to (comment)   POC complete     Progression Toward Goals   Progression toward goals Goals met, education completed, patient discharged from [place] Education - 07/09/21 1336     Education Details discharge summary, SLP recommendations    Person(s) Educated Patient    Methods Explanation;Demonstration    Comprehension Verbalized understanding;Returned demonstration              SLP Short Term Goals - 05/05/21 1304       SLP SHORT TERM GOAL #1   Title Pt will use memory compensations for appointments, medicine management, and other daily activities with occasional min A over 2 sessions    Baseline 04-14-21, 05-05-21    Status Achieved    Target Date 05/02/21      SLP SHORT TERM GOAL #2   Title Pt will demonstrate selective attention to auditory/written instructions for completion of simple-mod complex task given 2 or less repetitions over 2 sessions    Baseline 04-14-21, 05-05-21    Status Achieved    Target Date 05/02/21      SLP SHORT TERM GOAL #3   Title Pt will ID and correct errors on structured cognitive tasks with 80% accuracy given occasional min A over 2 sessions    Baseline 04-14-21, 05-05-21    Status Achieved    Target Date 05/02/21      SLP SHORT TERM GOAL #4   Title Pt will complete simple to mod complex executive functioning tasks with 80% accuracy given occasional min A over 2 sessions    Baseline 05-05-21    Status Partially Met    Target Date 05/02/21      SLP SHORT TERM GOAL #5   Title Pt/caregiver will complete cognitive PROM in first 1-2 sessions    Baseline Cognitive Function=56    Status Achieved    Target Date 05/02/21              SLP Long Term Goals - 07/09/21 1326        SLP LONG TERM GOAL #1   Title  Pt will use memory compensations for appointments, medicine management, and other daily activities with rare min A over 2 sessions    Baseline 05-15-21, 05-28-21    Status Achieved      SLP LONG TERM GOAL #2   Title Pt will ID and correct errors on structured cognitive tasks with 90% accuracy given rare min A over 4 sessions    Baseline 05-19-21, 07-09-21    Status Achieved      SLP LONG TERM GOAL #3   Title Pt will complete simple to mod complex executive functioning tasks with 90% accuracy given rare min A over 3 sessions    Baseline 05-19-21, 06-04-21, 07-09-21    Status Achieved      SLP LONG TERM GOAL #4   Title Pt/caregiver will report less frustration and report improved cognitive functioning via PROM by 5 points at last ST session    Baseline Cognitive Function=97    Status Achieved      SLP LONG TERM GOAL #5   Title Pt will identify and correct errors on funtional tasks related to work and home given rare min A over 2 sessions    Baseline 07-02-21, 07-09-21    Status Achieved              Plan - 07/09/21 1325     Clinical Impression Statement Pt exhibited significant progress and improvments in recall, attention, and problem solving. Education and training completed re: functional memory and attention strategies to implement at home. Pt believes he has reached maximum potential at this time and requested ST discharge. Pt aware of ability to request another script if further cognitive changes indicated.    Treatment/Interventions Compensatory strategies;Cueing hierarchy;Functional tasks;Patient/family education;Cognitive reorganization;Compensatory techniques;Internal/external aids;SLP instruction and feedback;Environmental controls    Potential to Achieve Goals Good    SLP Home Exercise Plan provided    Consulted and Agree with Plan of Care Patient             Patient will benefit from skilled therapeutic intervention in order to  improve the following deficits and impairments:   Cognitive communication deficit    Problem List Patient Active Problem List   Diagnosis Date Noted   Enterococcal bacteremia    History of stroke    Prosthetic valve endocarditis ([place])    Bacteremia due to Gram-positive bacteria 02/27/2021   Anemia 02/26/2021   Weakness generalized 02/26/2021   CKD (chronic kidney disease) stage 4, GFR 15-29 ml/min ([place]) 02/26/2021   Atrial fibrillation, chronic (HCC) 02/26/2021   Chronic anticoagulation 02/26/2021   Prolonged QT interval 02/26/2021   Depressive disorder 02/25/2021   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[place]) 02/25/2021   Squamous cell carcinoma of skin of face 02/25/2021   Actinic keratosis 02/25/2021   Anxiety state 02/25/2021   Basal cell carcinoma of nose 02/25/2021   Basal cell carcinoma of skin of unspecified ear and external auricular canal 02/25/2021   Benign prostatic hyperplasia with lower urinary tract symptoms 02/25/2021   Bilateral pseudophakia 02/25/2021   Carcinoma in situ of skin of other parts of face 02/25/2021   Dental caries on smooth surface penetrating into dentin 02/25/2021   Deposits (accretions) on teeth 02/25/2021   Diverticulitis of colon 02/25/2021   Encounter for fitting and adjustment of hearing aid 02/25/2021   Encounter for removal of sutures 02/25/2021   Esophageal reflux 02/25/2021   Generalized osteoarthrosis, involving multiple sites 02/25/2021   Gout, unspecified 02/25/2021   History of other malignant neoplasm of skin 02/25/2021   Male erectile  dysfunction, unspecified 02/25/2021   Peptic ulcer 02/25/2021   Post-traumatic stress disorder, unspecified 02/25/2021   Psychosexual dysfunction with inhibited sexual excitement 02/25/2021   Sensorineural hearing loss, bilateral 02/25/2021   Unspecified atrial fibrillation ([place]) 02/25/2021   Protein-calorie malnutrition, severe 02/04/2021   Cerebral thrombosis with cerebral infarction 02/02/2021   Right sided  weakness 02/01/2021   History of COVID-19 01/29/2021   Memory loss 01/29/2021   Olfactory impairment 01/29/2021   Gait disturbance 01/29/2021   Fatigue 01/29/2021   Physical deconditioning 01/29/2021   Pressure injury of skin 01/07/2021   AKI (acute kidney injury) ([place]) 01/06/2021   Generalized weakness 01/06/2021   ARF (acute renal failure) ([place]) 01/06/2021   Dyspnea 10/05/2020   CHF (congestive heart failure) ([place]) 10/04/2020   Carpal tunnel syndrome of right wrist 09/25/2020   Pain of left hand 09/25/2020   COVID-19 virus infection 05/24/2020   Hypertensive chronic kidney disease with stage 1 through stage 4 chronic kidney disease, or unspecified chronic kidney disease    Coronary artery disease    Type II diabetes mellitus ([place])    Hypertension    Back pain 04/08/2020   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/05/2019   Pain in right foot 03/22/2018   Cellulitis of left foot 03/01/2018   Pain of joint of left ankle and foot 09/06/2017   Cancer ([place])    Other specified counseling 04/03/2016   Pneumothorax on right 03/29/2016   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/25/2016   Atherosclerotic heart disease of native coronary artery without angina pectoris 03/10/2016   Heart failure ([place]) 03/10/2016   Cataract extraction status of eye 03/10/2016   Other long term (current) drug therapy 03/10/2016   Osteoarthritis of knee 03/10/2016   Vitamin D deficiency 03/10/2016   Mitral regurgitation    Avitaminosis D 12/25/2015   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   Arthritis of knee, degenerative 12/25/2015   H/O cataract extraction 12/25/2015   Atherosclerotic heart disease of native coronary artery with other forms of angina pectoris ([place]) 12/25/2015   Congestive heart failure ([place]) 12/25/2015   Pure hypercholesterolemia 12/25/2015   Allergic rhinitis 12/25/2015   Essential (primary) hypertension 12/25/2015   Hypotension 01/29/2014   Expected Blood loss, postoperative 04/29/2012    Pain due to unicompartmental arthroplasty of knee ([place]) 04/27/2012   Preoperative evaluation of a medical condition to rule out surgical contraindications (TAR required) 76/16/0737   Diastolic CHF, chronic ([place]) 06/29/2011   Preoperative evaluation to rule out surgical contraindication 06/29/2011   CAROTID ARTERY STENOSIS 07/09/2010   TIA 05/29/2010   FATIGUE / MALAISE 05/29/2010   CAD, AUTOLOGOUS BYPASS GRAFT 11/15/2008   MYOCARDIAL INFARCTION 12/18/2007   ALLERGIC RHINITIS WITH CONJUNCTIVITIS 12/18/2007   Type 2 diabetes mellitus without complications ([place]) 10/62/6948   Other and unspecified hyperlipidemia 12/05/2007   Other ill-defined and unknown causes of morbidity and mortality 12/05/2007   Essential hypertension 12/05/2007   Coronary atherosclerosis 12/05/2007   DEGENERATIVE JOINT DISEASE 12/05/2007   Obstructive sleep apnea of adult 12/05/2007    Alinda Deem, MA CCC-SLP 07/09/2021, 1:52 PM  [facility] [address] [place], [place], [address] Phone: [phone]   Fax:  [phone]   Name: FERMAN BASILIO MRN: [id number] Date of Birth: March 08, 1946

## 2021-07-10 ENCOUNTER — Other Ambulatory Visit: Payer: Self-pay | Admitting: *Deleted

## 2021-07-10 NOTE — Patient Outreach (Signed)
[facility]) Care Management  [place]  07/10/2021   [name] May 28, 1946 [id number]   Outreach attempt #2, successful to member.  Denies any urgent concerns, encouraged to contact this care manager with questions.    Encounter Medications:  Outpatient Encounter Medications as of 07/10/2021  Medication Sig   acetaminophen (TYLENOL) 325 MG tablet Take 2 tablets (650 mg total) by mouth every 6 (six) hours as needed for mild pain (or Fever >/= 101).   Alirocumab (PRALUENT) 75 MG/ML SOAJ Inject 75 mg into the skin every 14 (fourteen) days.   allopurinol (ZYLOPRIM) 100 MG tablet Take 0.5 tablets (50 mg total) by mouth 2 (two) times a week. Please discuss dosing with your PCP based on your renal function.   amiodarone (PACERONE) 200 MG tablet Take 0.5 tablets (100 mg total) by mouth daily.   apixaban (ELIQUIS) 5 MG TABS tablet Take 1 tablet (5 mg total) by mouth 2 (two) times daily.   Ascorbic Acid (VITAMIN C WITH ROSE HIPS) 1000 MG tablet Take 1,000 mg by mouth daily.   calcitRIOL (ROCALTROL) 0.25 MCG capsule Take 0.25 mcg by mouth every other day.   Carboxymethylcellulose Sodium 1 % GEL APPLY 1 DROP TO EACH EYE AT BEDTIME   cetirizine (ZYRTEC) 10 MG tablet Take 10 mg by mouth daily.   colchicine 0.6 MG tablet Take 0.6-1.2 mg by mouth 2 (two) times daily as needed (gout).    fluticasone (FLONASE) 50 MCG/ACT nasal spray Place 1 spray into both nostrils daily as needed for allergies or rhinitis.   furosemide (LASIX) 80 MG tablet Take 80 mg by mouth daily.   HYDROcodone-acetaminophen (NORCO/VICODIN) 5-325 MG tablet Take 1-2 tablets by mouth every 4 (four) hours as needed for severe pain.   nitroGLYCERIN (NITROSTAT) 0.4 MG SL tablet 1 TAB UNDER THE TONGUE EVERY 5 MINUTES AS NEEDED FOR CHEST PAIN UP TO 3 DOSES, IF PERSIST CALL 911   ONETOUCH VERIO test strip 1 each daily.   pantoprazole (PROTONIX) 40 MG tablet Take 40 mg by mouth daily as needed (heartburn).    potassium chloride SA (KLOR-CON) 20 MEQ tablet Take 20 mEq by mouth daily.   Semaglutide,0.25 or 0.5MG /DOS, (OZEMPIC, 0.25 OR 0.5 MG/DOSE,) 2 MG/1.5ML SOPN Inject 0.375 mLs (0.5 mg total) into the skin every Thursday.   tadalafil (CIALIS) 20 MG tablet TAKE 1 TABLET BY MOUTH ONCE DAILY AS NEEDED   traZODone (DESYREL) 100 MG tablet Take 100 mg by mouth at bedtime.   No facility-administered encounter medications on file as of 07/10/2021.    Functional Status:  In your present state of health, do you have any difficulty performing the following activities: 02/26/2021 02/26/2021  Hearing? - N  Vision? - N  Difficulty concentrating or making decisions? - Y  Walking or climbing stairs? - Y  Comment - secondary to weakness  Dressing or bathing? - N  Doing errands, shopping? N -  Some recent data might be hidden    Fall/Depression Screening: Fall Risk  04/23/2021 04/01/2021 03/27/2021  Falls in the past year? 1 1 1   Number falls in past yr: 1 1 1   Injury with Fall? 1 1 1   Comment - skin tears, bruises -  Risk for fall due to : - - History of fall(s)  Follow up Falls prevention discussed - Falls evaluation completed   PHQ 2/9 Scores 04/23/2021 03/27/2021 03/25/2021 03/10/2018 11/22/2017 08/21/2016 05/11/2016  PHQ - 2 Score 0 0 0 0 0 0 0  Assessment:   Care Plan Care Plan : General Plan of Care (Adult)  Updates made by Valente David, RN since 07/10/2021 12:00 AM     Problem: Risk for readmission related to recent stroke and prosthetic valve endocarditis      Long-Range Goal: Member will not be readmitted to hospital within the  next 90 days Completed 07/10/2021  Start Date: 03/27/2021  Expected End Date: 06/26/2021  Recent Progress: On track  Priority: High  Note:   Current Barriers:  Knowledge Deficits related to plan of care for management of Atrial Fibrillation and Stroke, Prosthetic Valve Endocarditis  Chronic Disease Management support and education needs related to Atrial Fibrillation and  Stroke, prosthetic valve endocarditis  RNCM Clinical Goal(s):  Patient will verbalize understanding of plan for management of Atrial Fibrillation and Stroke and prosthetic valve endocarditis work with [facility] to address needs related to Atrial Fibrillation and stroke and prosthetic valve endocarditis and Cognitive Deficits and Memory Deficits attend all scheduled medical appointments: Vascular 8/5, Neurology 8/9, cardiology 8/16, ID 8/31, and endocrinology 10/27 not experience hospital admission. Hospital Admissions in last 6 months = 4 work with [facility] to gain strength, memory and speech  through collaboration with Consulting civil engineer, provider, and care team.   Interventions: 1:1 collaboration with primary care provider regarding development and update of comprehensive plan of care as evidenced by provider attestation and co-signature Inter-disciplinary care team collaboration (see longitudinal plan of care) Evaluation of current treatment plan related to  self management and patient's adherence to plan as established by provider;   A-fib:  (Status: Goal on track: YES.) Counseled on increased risk of stroke due to Afib and benefits of anticoagulation for stroke prevention Counseled on bleeding risk associated with Eliquis and importance of self-monitoring for signs/symptoms of bleeding Afib action plan reviewed  Stroke  (Status: Goal on Track (progressing): YES.) Evaluation of current treatment plan related to  Stroke and endocarditis , Limited education about PICC line and IV antibiotics* self-management and patient's adherence to plan as established by provider. Discussed plans with patient for ongoing care management follow up and provided patient with direct contact information for care management team.  Active with [facility] for PT and ST as well Advised patient to Continue monitoring and recording BP, HR, and daily weights; Provided education to patient re: HTN and A-fib management and  stroke prevention; Reviewed scheduled/upcoming provider appointments including Neurology and PCP;  Update 11/17 - Completed sessions with PT and memory rehab, No longer receiving IV antibiotics and PICC line removed.  He has been monitoring vitals, state they are "good."  Denies any chest pain or discomfort.   Patient Goals/Self-Care Activities: Patient will attend all scheduled provider appointments Patient will continue to perform ADL's independently Patient will call provider office for new concerns or questions - ask family or friend for a ride - call to cancel if needed - keep a calendar with appointment dates - arrange in-home help services - eat healthy to increase strength - know who to call for help if I fall        Goals Addressed             This Visit's Progress    COMPLETED: [facility] - Keep or Improve My Strength-Stroke       Timeframe:  Long-Range Goal Priority:  Medium Start Date:     8/4  Expected End Date:    11/4                   Barriers: Knowledge    - arrange in-home help services - eat healthy to increase strength - know who to call for help if I fall    Why is this important?   Before the stroke you probably did not think much about being safe when you are up and about.  Now, it may be harder for you to get around.  It may also be easier for you to trip or fall.  It is common to have muscle weakness after a stroke. You may also feel like you cannot control an arm or leg.  It will be helpful to work with a physical therapist to get your strength and muscle control back.  It is good to stay as active as you can. Walking and stretching help you stay strong and flexible.  The physical therapist will develop an exercise program just for you.     Notes:   8/31 - Call placed to [place], notified that member has completed [facility] PT and will need outpatient PT to continue advancement.  Member has had another fall since  discharge, encouraged to use walker and/or cane for more stability.  Wife would like member to attend [facility] PT, call placed, they do not have current order.  Call placed to PCP office to request order be placed.  Provided with [name] fax number [phone])  9/16 - Confirms he has started with outpatient PT.  Report progress in strength  10/17 - Remains active with outpatient therapy.  Member will have shoe insert for foot drop and [facility].  Also remains active with neuro/memory rehab.  Both will be in place reportedly throughout November  11/17 - Rehab completed, report increased/stable strength.     COMPLETED: [facility]N - Make and Keep All Appointments       Timeframe:  Short-Term Goal Priority:  High Start Date:      8/4                       Expected End Date:        11/4               Barriers: Knowledge    - ask family or friend for a ride - call to cancel if needed - keep a calendar with appointment dates    Why is this important?   Part of staying healthy is seeing the doctor for follow-up care.  If you forget your appointments, there are some things you can do to stay on track.    Notes:   8/31 - Member has attended PCP appointment since discharge, was to follow up yesterday but office had to reschedule for next month.  Will follow up with ID today for assessment of infection  9/15 - Has had appointment with ID, no urgent issues noted.  Will see vascular on 9/28, HF clinic on 10/25 and endocrinology on 10/27  10/17 - Reminded member and wife of upcoming appointments with HF clinic and endocrinology.  Report blood sugars has been stable, usually less than 140.  He has not been monitoring HR and BP, reminded to do so and record readings  11/17 - Resolving due to duplicate goal         Plan:  Follow-up: Patient agrees  to Care Plan and Follow-up. Follow-up in 1 month(s)  Valente David, RN, MSN [place] [phone]

## 2021-07-11 ENCOUNTER — Ambulatory Visit: Payer: Self-pay | Admitting: *Deleted

## 2021-07-16 DIAGNOSIS — D485 Neoplasm of uncertain behavior of skin: Secondary | ICD-10-CM | POA: Diagnosis not present

## 2021-07-16 DIAGNOSIS — C44622 Squamous cell carcinoma of skin of right upper limb, including shoulder: Secondary | ICD-10-CM | POA: Diagnosis not present

## 2021-07-16 DIAGNOSIS — D225 Melanocytic nevi of trunk: Secondary | ICD-10-CM | POA: Diagnosis not present

## 2021-07-16 DIAGNOSIS — L570 Actinic keratosis: Secondary | ICD-10-CM | POA: Diagnosis not present

## 2021-07-16 DIAGNOSIS — Z85828 Personal history of other malignant neoplasm of skin: Secondary | ICD-10-CM | POA: Diagnosis not present

## 2021-07-16 DIAGNOSIS — C44329 Squamous cell carcinoma of skin of other parts of face: Secondary | ICD-10-CM | POA: Diagnosis not present

## 2021-07-28 DIAGNOSIS — R5383 Other fatigue: Secondary | ICD-10-CM | POA: Diagnosis not present

## 2021-07-28 DIAGNOSIS — I4891 Unspecified atrial fibrillation: Secondary | ICD-10-CM | POA: Diagnosis not present

## 2021-07-28 DIAGNOSIS — N184 Chronic kidney disease, stage 4 (severe): Secondary | ICD-10-CM | POA: Diagnosis not present

## 2021-07-28 DIAGNOSIS — N189 Chronic kidney disease, unspecified: Secondary | ICD-10-CM | POA: Diagnosis not present

## 2021-07-30 DIAGNOSIS — Z85828 Personal history of other malignant neoplasm of skin: Secondary | ICD-10-CM | POA: Diagnosis not present

## 2021-07-30 DIAGNOSIS — D485 Neoplasm of uncertain behavior of skin: Secondary | ICD-10-CM | POA: Diagnosis not present

## 2021-07-30 DIAGNOSIS — C44622 Squamous cell carcinoma of skin of right upper limb, including shoulder: Secondary | ICD-10-CM | POA: Diagnosis not present

## 2021-07-30 DIAGNOSIS — C44329 Squamous cell carcinoma of skin of other parts of face: Secondary | ICD-10-CM | POA: Diagnosis not present

## 2021-07-30 DIAGNOSIS — L859 Epidermal thickening, unspecified: Secondary | ICD-10-CM | POA: Diagnosis not present

## 2021-07-30 DIAGNOSIS — L570 Actinic keratosis: Secondary | ICD-10-CM | POA: Diagnosis not present

## 2021-08-07 ENCOUNTER — Other Ambulatory Visit: Payer: Self-pay | Admitting: *Deleted

## 2021-08-07 NOTE — Patient Outreach (Signed)
[facility]) Care Management  [place]  08/07/2021   [name] 1945-10-05 [id number]   Outgoing call placed to member, successful.  Denies any urgent concerns, encouraged to contact this care manager with questions.    Encounter Medications:  Outpatient Encounter Medications as of 08/07/2021  Medication Sig   acetaminophen (TYLENOL) 325 MG tablet Take 2 tablets (650 mg total) by mouth every 6 (six) hours as needed for mild pain (or Fever >/= 101).   Alirocumab (PRALUENT) 75 MG/ML SOAJ Inject 75 mg into the skin every 14 (fourteen) days.   allopurinol (ZYLOPRIM) 100 MG tablet Take 0.5 tablets (50 mg total) by mouth 2 (two) times a week. Please discuss dosing with your PCP based on your renal function.   amiodarone (PACERONE) 200 MG tablet Take 0.5 tablets (100 mg total) by mouth daily.   apixaban (ELIQUIS) 5 MG TABS tablet Take 1 tablet (5 mg total) by mouth 2 (two) times daily.   Ascorbic Acid (VITAMIN C WITH ROSE HIPS) 1000 MG tablet Take 1,000 mg by mouth daily.   calcitRIOL (ROCALTROL) 0.25 MCG capsule Take 0.25 mcg by mouth every other day.   Carboxymethylcellulose Sodium 1 % GEL APPLY 1 DROP TO EACH EYE AT BEDTIME   cetirizine (ZYRTEC) 10 MG tablet Take 10 mg by mouth daily.   colchicine 0.6 MG tablet Take 0.6-1.2 mg by mouth 2 (two) times daily as needed (gout).    fluticasone (FLONASE) 50 MCG/ACT nasal spray Place 1 spray into both nostrils daily as needed for allergies or rhinitis.   furosemide (LASIX) 80 MG tablet Take 80 mg by mouth daily.   HYDROcodone-acetaminophen (NORCO/VICODIN) 5-325 MG tablet Take 1-2 tablets by mouth every 4 (four) hours as needed for severe pain.   nitroGLYCERIN (NITROSTAT) 0.4 MG SL tablet 1 TAB UNDER THE TONGUE EVERY 5 MINUTES AS NEEDED FOR CHEST PAIN UP TO 3 DOSES, IF PERSIST CALL 911   ONETOUCH VERIO test strip 1 each daily.   pantoprazole (PROTONIX) 40 MG tablet Take 40 mg by mouth daily as needed (heartburn).    potassium chloride SA (KLOR-CON) 20 MEQ tablet Take 20 mEq by mouth daily.   Semaglutide,0.25 or 0.5MG/DOS, (OZEMPIC, 0.25 OR 0.5 MG/DOSE,) 2 MG/1.5ML SOPN Inject 0.375 mLs (0.5 mg total) into the skin every Thursday.   tadalafil (CIALIS) 20 MG tablet TAKE 1 TABLET BY MOUTH ONCE DAILY AS NEEDED   traZODone (DESYREL) 100 MG tablet Take 100 mg by mouth at bedtime.   No facility-administered encounter medications on file as of 08/07/2021.    Functional Status:  In your present state of health, do you have any difficulty performing the following activities: 02/26/2021 02/26/2021  Hearing? - N  Vision? - N  Difficulty concentrating or making decisions? - Y  Walking or climbing stairs? - Y  Comment - secondary to weakness  Dressing or bathing? - N  Doing errands, shopping? N -  Some recent data might be hidden    Fall/Depression Screening: Fall Risk  04/23/2021 04/01/2021 03/27/2021  Falls in the past year? _0 Number falls in past yr: _1 Injury with Fall? _2 Comment - skin tears, bruises -  Risk for fall due to : - - History of fall(s)  Follow up Falls prevention discussed - Falls evaluation completed   PHQ 2/9 Scores 04/23/2021 03/27/2021 03/25/2021 03/10/2018 11/22/2017 08/21/2016 05/11/2016  PHQ - 2 Score 0 0 0 0 0 0 0  Assessment:   Care Plan Care Plan : General Plan of Care (Adult)  Updates made by Valente David, RN since 08/07/2021 12:00 AM     Problem: Management of chronic medical conditions (HTN, CHF, A-fib)   Priority: High     Long-Range Goal: Member will be able to adequately verbalize plan of care for management of chronic medical conditions   Start Date: 08/07/2021  Expected End Date: 02/03/2022  Priority: High  Note:   Current Barriers:  Chronic Disease Management support and education needs related to Atrial Fibrillation, CHF, and HTN  RNCM Clinical Goal(s):  Patient will verbalize understanding of plan for management of Atrial Fibrillation, CHF, and HTN as  evidenced by verbalizing plan of care take all medications exactly as prescribed and will call provider for medication related questions as evidenced by member reported adherence    attend all scheduled medical appointments: Neurology, PCP, cardiology as evidenced by Member reported adherence        continue to work with RN Care Manager and/or Social Worker to address care management and care coordination needs related to Atrial Fibrillation, CHF, and HTN as evidenced by adherence to CM Team Scheduled appointments     through collaboration with RN Care manager, provider, and care team.   Interventions: Inter-disciplinary care team collaboration (see longitudinal plan of care) Evaluation of current treatment plan related to  self management and patient's adherence to plan as established by provider   A-fib:  (Status: New goal.) Long Term Goal  Counseled on increased risk of stroke due to Afib and benefits of anticoagulation for stroke prevention           Reviewed importance of adherence to anticoagulant exactly as prescribed Afib action plan reviewed Screening for signs and symptoms of depression related to chronic disease state  Heart Failure Interventions:  (Status: New goal.)  Long Term Goal  Provided education on low sodium diet Reviewed Heart Failure Action Plan in depth and provided written copy Discussed importance of daily weight and advised patient to weigh and record daily  Hypertension: (Status: New goal.) Last practice recorded BP readings:  BP Readings from Last 3 Encounters:  06/19/21 110/62  06/17/21 122/60  05/21/21 125/71  Most recent eGFR/CrCl: No results found for: EGFR  No components found for: CRCL  Provided education to patient re: stroke prevention, s/s of heart attack and stroke; Reviewed medications with patient and discussed importance of compliance;  Discussed plans with patient for ongoing care management follow up and provided patient with direct contact  information for care management team;  Patient Goals/Self-Care Activities: Take medications as prescribed   Attend all scheduled provider appointments Call provider office for new concerns or questions  call office if I gain more than 2 pounds in one day or 5 pounds in one week keep legs up while sitting track weight in diary use salt in moderation watch for swelling in feet, ankles and legs every day - check pulse (heart) rate once a day - take medicine as prescribed check blood pressure daily write blood pressure results in a log or diary take blood pressure log to all doctor appointments keep all doctor appointments          Plan:  Follow-up: Patient agrees to Care Plan and Follow-up. Follow-up in 1 month(s).  Valente David, RN, MSN, [place] Manager [phone]

## 2021-08-27 DIAGNOSIS — L570 Actinic keratosis: Secondary | ICD-10-CM | POA: Diagnosis not present

## 2021-08-27 DIAGNOSIS — Z85828 Personal history of other malignant neoplasm of skin: Secondary | ICD-10-CM | POA: Diagnosis not present

## 2021-08-27 DIAGNOSIS — L720 Epidermal cyst: Secondary | ICD-10-CM | POA: Diagnosis not present

## 2021-08-28 ENCOUNTER — Inpatient Hospital Stay (HOSPITAL_BASED_OUTPATIENT_CLINIC_OR_DEPARTMENT_OTHER)
Admission: EM | Admit: 2021-08-28 | Discharge: 2021-09-03 | DRG: 345 | Disposition: A | Discharge status: Home Health | Payer: No Typology Code available for payment source | Attending: Internal Medicine | Admitting: Internal Medicine

## 2021-08-28 ENCOUNTER — Other Ambulatory Visit: Payer: Self-pay

## 2021-08-28 ENCOUNTER — Emergency Department (HOSPITAL_BASED_OUTPATIENT_CLINIC_OR_DEPARTMENT_OTHER): Payer: No Typology Code available for payment source

## 2021-08-28 ENCOUNTER — Encounter (HOSPITAL_BASED_OUTPATIENT_CLINIC_OR_DEPARTMENT_OTHER): Payer: Self-pay | Admitting: *Deleted

## 2021-08-28 DIAGNOSIS — Z951 Presence of aortocoronary bypass graft: Secondary | ICD-10-CM

## 2021-08-28 DIAGNOSIS — Z7901 Long term (current) use of anticoagulants: Secondary | ICD-10-CM

## 2021-08-28 DIAGNOSIS — Z952 Presence of prosthetic heart valve: Secondary | ICD-10-CM

## 2021-08-28 DIAGNOSIS — L03317 Cellulitis of buttock: Secondary | ICD-10-CM | POA: Diagnosis present

## 2021-08-28 DIAGNOSIS — I482 Chronic atrial fibrillation, unspecified: Secondary | ICD-10-CM | POA: Diagnosis present

## 2021-08-28 DIAGNOSIS — K611 Rectal abscess: Secondary | ICD-10-CM | POA: Diagnosis present

## 2021-08-28 DIAGNOSIS — Z833 Family history of diabetes mellitus: Secondary | ICD-10-CM

## 2021-08-28 DIAGNOSIS — I671 Cerebral aneurysm, nonruptured: Secondary | ICD-10-CM | POA: Diagnosis present

## 2021-08-28 DIAGNOSIS — M21371 Foot drop, right foot: Secondary | ICD-10-CM | POA: Diagnosis present

## 2021-08-28 DIAGNOSIS — I5032 Chronic diastolic (congestive) heart failure: Secondary | ICD-10-CM | POA: Diagnosis present

## 2021-08-28 DIAGNOSIS — I252 Old myocardial infarction: Secondary | ICD-10-CM

## 2021-08-28 DIAGNOSIS — A419 Sepsis, unspecified organism: Secondary | ICD-10-CM

## 2021-08-28 DIAGNOSIS — K219 Gastro-esophageal reflux disease without esophagitis: Secondary | ICD-10-CM | POA: Diagnosis present

## 2021-08-28 DIAGNOSIS — Z85828 Personal history of other malignant neoplasm of skin: Secondary | ICD-10-CM | POA: Diagnosis not present

## 2021-08-28 DIAGNOSIS — I2510 Atherosclerotic heart disease of native coronary artery without angina pectoris: Secondary | ICD-10-CM | POA: Diagnosis present

## 2021-08-28 DIAGNOSIS — E785 Hyperlipidemia, unspecified: Secondary | ICD-10-CM | POA: Diagnosis present

## 2021-08-28 DIAGNOSIS — E872 Acidosis, unspecified: Secondary | ICD-10-CM | POA: Diagnosis present

## 2021-08-28 DIAGNOSIS — F32A Depression, unspecified: Secondary | ICD-10-CM | POA: Diagnosis present

## 2021-08-28 DIAGNOSIS — E1122 Type 2 diabetes mellitus with diabetic chronic kidney disease: Secondary | ICD-10-CM | POA: Diagnosis present

## 2021-08-28 DIAGNOSIS — D6489 Other specified anemias: Secondary | ICD-10-CM | POA: Diagnosis present

## 2021-08-28 DIAGNOSIS — I69354 Hemiplegia and hemiparesis following cerebral infarction affecting left non-dominant side: Secondary | ICD-10-CM | POA: Diagnosis not present

## 2021-08-28 DIAGNOSIS — I130 Hypertensive heart and chronic kidney disease with heart failure and stage 1 through stage 4 chronic kidney disease, or unspecified chronic kidney disease: Secondary | ICD-10-CM | POA: Diagnosis present

## 2021-08-28 DIAGNOSIS — Z955 Presence of coronary angioplasty implant and graft: Secondary | ICD-10-CM

## 2021-08-28 DIAGNOSIS — Z8249 Family history of ischemic heart disease and other diseases of the circulatory system: Secondary | ICD-10-CM

## 2021-08-28 DIAGNOSIS — Z8616 Personal history of COVID-19: Secondary | ICD-10-CM | POA: Diagnosis not present

## 2021-08-28 DIAGNOSIS — F4310 Post-traumatic stress disorder, unspecified: Secondary | ICD-10-CM | POA: Diagnosis present

## 2021-08-28 DIAGNOSIS — Z823 Family history of stroke: Secondary | ICD-10-CM | POA: Diagnosis not present

## 2021-08-28 DIAGNOSIS — R32 Unspecified urinary incontinence: Secondary | ICD-10-CM | POA: Diagnosis present

## 2021-08-28 DIAGNOSIS — N184 Chronic kidney disease, stage 4 (severe): Secondary | ICD-10-CM | POA: Diagnosis present

## 2021-08-28 DIAGNOSIS — L0231 Cutaneous abscess of buttock: Secondary | ICD-10-CM | POA: Diagnosis present

## 2021-08-28 DIAGNOSIS — E119 Type 2 diabetes mellitus without complications: Secondary | ICD-10-CM

## 2021-08-28 DIAGNOSIS — I509 Heart failure, unspecified: Secondary | ICD-10-CM

## 2021-08-28 DIAGNOSIS — G4733 Obstructive sleep apnea (adult) (pediatric): Secondary | ICD-10-CM | POA: Diagnosis present

## 2021-08-28 LAB — COMPREHENSIVE METABOLIC PANEL
ALT: 15 U/L (ref 0–44)
AST: 20 U/L (ref 15–41)
Albumin: 3.9 g/dL (ref 3.5–5.0)
Alkaline Phosphatase: 78 U/L (ref 38–126)
Anion gap: 13 (ref 5–15)
BUN: 43 mg/dL — ABNORMAL HIGH (ref 8–23)
CO2: 20 mmol/L — ABNORMAL LOW (ref 22–32)
Calcium: 10.1 mg/dL (ref 8.9–10.3)
Chloride: 100 mmol/L (ref 98–111)
Creatinine, Ser: 3.85 mg/dL — ABNORMAL HIGH (ref 0.61–1.24)
GFR, Estimated: 15 mL/min — ABNORMAL LOW (ref >60)
Glucose, Bld: 158 mg/dL — ABNORMAL HIGH (ref 70–99)
Potassium: 3.9 mmol/L (ref 3.5–5.1)
Sodium: 133 mmol/L — ABNORMAL LOW (ref 135–145)
Total Bilirubin: 1.2 mg/dL (ref 0.3–1.2)
Total Protein: 6.7 g/dL (ref 6.5–8.1)

## 2021-08-28 LAB — URINALYSIS, MICROSCOPIC (REFLEX)

## 2021-08-28 LAB — CBC WITH DIFFERENTIAL/PLATELET
Abs Immature Granulocytes: 0.08 10*3/uL — ABNORMAL HIGH (ref 0.00–0.07)
Basophils Absolute: 0.1 10*3/uL (ref 0.0–0.1)
Basophils Relative: 0 %
Eosinophils Absolute: 0.1 10*3/uL (ref 0.0–0.5)
Eosinophils Relative: 1 %
HCT: 33.6 % — ABNORMAL LOW (ref 39.0–52.0)
Hemoglobin: 11.5 g/dL — ABNORMAL LOW (ref 13.0–17.0)
Immature Granulocytes: 1 %
Lymphocytes Relative: 3 %
Lymphs Abs: 0.5 10*3/uL — ABNORMAL LOW (ref 0.7–4.0)
MCH: 33.1 pg (ref 26.0–34.0)
MCHC: 34.2 g/dL (ref 30.0–36.0)
MCV: 96.8 fL (ref 80.0–100.0)
Monocytes Absolute: 0.8 10*3/uL (ref 0.1–1.0)
Monocytes Relative: 5 %
Neutro Abs: 13.7 10*3/uL — ABNORMAL HIGH (ref 1.7–7.7)
Neutrophils Relative %: 90 %
Platelets: 149 10*3/uL — ABNORMAL LOW (ref 150–400)
RBC: 3.47 MIL/uL — ABNORMAL LOW (ref 4.22–5.81)
RDW: 13.2 % (ref 11.5–15.5)
WBC: 15.2 10*3/uL — ABNORMAL HIGH (ref 4.0–10.5)
nRBC: 0 % (ref 0.0–0.2)

## 2021-08-28 LAB — URINALYSIS, ROUTINE W REFLEX MICROSCOPIC
Bilirubin Urine: NEGATIVE
Glucose, UA: NEGATIVE mg/dL
Ketones, ur: NEGATIVE mg/dL
Leukocytes,Ua: NEGATIVE
Nitrite: NEGATIVE
Protein, ur: NEGATIVE mg/dL
Specific Gravity, Urine: 1.015 (ref 1.005–1.030)
pH: 5.5 (ref 5.0–8.0)

## 2021-08-28 LAB — RESP PANEL BY RT-PCR (FLU A&B, COVID) ARPGX2
Influenza A by PCR: NEGATIVE
Influenza B by PCR: NEGATIVE
SARS Coronavirus 2 by RT PCR: NEGATIVE

## 2021-08-28 LAB — LACTIC ACID, PLASMA: Lactic Acid, Venous: 1.3 mmol/L (ref 0.5–1.9)

## 2021-08-28 MED ORDER — VANCOMYCIN HCL 10 G IV SOLR
500.0000 mg | Freq: Once | INTRAVENOUS | Status: DC
  Filled 2021-08-28: qty 5

## 2021-08-28 MED ORDER — LACTATED RINGERS IV BOLUS: 30.0000 mL/kg | Freq: Once | INTRAVENOUS | Status: DC

## 2021-08-28 MED ORDER — LACTATED RINGERS IV BOLUS (SEPSIS)
500.0000 mL | Freq: Once | INTRAVENOUS | Status: AC
  Administered 2021-08-28: 500 mL via INTRAVENOUS

## 2021-08-28 MED ORDER — VANCOMYCIN HCL IN DEXTROSE 1-5 GM/200ML-% IV SOLN
1000.0000 mg | Freq: Once | INTRAVENOUS | Status: AC
  Administered 2021-08-28: 1000 mg via INTRAVENOUS
  Filled 2021-08-28: qty 200

## 2021-08-28 MED ORDER — SODIUM CHLORIDE 0.9 % IV SOLN
2.0000 g | INTRAVENOUS | Status: DC
  Administered 2021-08-28: 2 g via INTRAVENOUS
  Filled 2021-08-28: qty 20

## 2021-08-28 MED ORDER — PIPERACILLIN-TAZOBACTAM 3.375 G IVPB 30 MIN
3.3750 g | Freq: Once | INTRAVENOUS | Status: AC
  Administered 2021-08-28: 3.375 g via INTRAVENOUS
  Filled 2021-08-28: qty 50

## 2021-08-28 MED ORDER — VANCOMYCIN HCL IN DEXTROSE 1-5 GM/200ML-% IV SOLN
1000.0000 mg | INTRAVENOUS | Status: DC
  Administered 2021-08-31: 1000 mg via INTRAVENOUS
  Filled 2021-08-28: qty 200

## 2021-08-28 MED ORDER — LACTATED RINGERS IV SOLN: INTRAVENOUS | Status: DC

## 2021-08-28 MED ORDER — ACETAMINOPHEN 325 MG PO TABS
650.0000 mg | ORAL_TABLET | Freq: Once | ORAL | Status: AC
  Administered 2021-08-28: 650 mg via ORAL
  Filled 2021-08-28: qty 2

## 2021-08-28 NOTE — ED Notes (Signed)
Pt transported to CT ?

## 2021-08-28 NOTE — Sepsis Progress Note (Signed)
Monitoring for the code sepsis protocol. °

## 2021-08-28 NOTE — ED Notes (Signed)
ED Provider at bedside. Unable to obtain vitals at this time.

## 2021-08-28 NOTE — ED Provider Notes (Signed)
[facility] EMERGENCY DEPARTMENT Provider Note    CSN: [id number] Arrival date & time: 08/28/21 1652  History Chief Complaint  Patient presents with   Abscess   Fever    [name] is a 76 y.o. male with history of sepsis per wife that was from ?heart valve, brought for evaluation of fever to 102.27F at home today. Patient has had pain and swelling to his R buttock/perirectal area for a few days but had not told his wife about it until today. He was seen at his dermatologists office where they were concerned about a skin infection to a lesion on his L shoulder and started him on Doxycycline. He reports pain with sitting and with bowel movements. No problems urinating.   Home Medications Prior to Admission medications   Medication Sig Start Date End Date Taking? Authorizing Provider  acetaminophen (TYLENOL) 325 MG tablet Take 2 tablets (650 mg total) by mouth every 6 (six) hours as needed for mild pain (or Fever >/= 101). 03/07/21   [name], MD  Alirocumab (PRALUENT) 75 MG/ML SOAJ Inject 75 mg into the skin every 14 (fourteen) days.    [provider]  allopurinol (ZYLOPRIM) 100 MG tablet Take 0.5 tablets (50 mg total) by mouth 2 (two) times a week. Please discuss dosing with your PCP based on your renal function. 01/13/21 03/11/22  [name], MD  amiodarone (PACERONE) 200 MG tablet Take 0.5 tablets (100 mg total) by mouth daily. 04/08/21   [name], [name], FNP  apixaban (ELIQUIS) 5 MG TABS tablet Take 1 tablet (5 mg total) by mouth 2 (two) times daily. 09/09/17   [name], MD  Ascorbic Acid (VITAMIN C WITH ROSE HIPS) 1000 MG tablet Take 1,000 mg by mouth daily.    [provider]  calcitRIOL (ROCALTROL) 0.25 MCG capsule Take 0.25 mcg by mouth every other day.    [provider]  Carboxymethylcellulose Sodium 1 % GEL APPLY 1 DROP TO EACH EYE AT BEDTIME 12/06/20   [provider]  cetirizine (ZYRTEC) 10 MG tablet  Take 10 mg by mouth daily.    [provider]  colchicine 0.6 MG tablet Take 0.6-1.2 mg by mouth 2 (two) times daily as needed (gout).  04/14/19   [provider]  fluticasone (FLONASE) 50 MCG/ACT nasal spray Place 1 spray into both nostrils daily as needed for allergies or rhinitis.    [provider]  furosemide (LASIX) 80 MG tablet Take 80 mg by mouth daily.    [provider]  HYDROcodone-acetaminophen (NORCO/VICODIN) 5-325 MG tablet Take 1-2 tablets by mouth every 4 (four) hours as needed for severe pain. 03/07/21   [name], MD  nitroGLYCERIN (NITROSTAT) 0.4 MG SL tablet 1 TAB UNDER THE TONGUE EVERY 5 MINUTES AS NEEDED FOR CHEST PAIN UP TO 3 DOSES, IF PERSIST CALL 911 03/05/20   [name], MD  [facility] VERIO test strip 1 each daily. 02/23/21   [provider]  pantoprazole (PROTONIX) 40 MG tablet Take 40 mg by mouth daily as needed (heartburn).    [provider]  potassium chloride SA (KLOR-CON) 20 MEQ tablet Take 20 mEq by mouth daily.    [provider]  Semaglutide,0.25 or 0.5MG /DOS, (OZEMPIC, 0.25 OR 0.5 MG/DOSE,) 2 MG/1.5ML SOPN Inject 0.375 mLs (0.5 mg total) into the skin every Thursday. 02/01/20   Philemon Kingdom, MD  tadalafil (CIALIS) 20 MG tablet TAKE 1 TABLET BY MOUTH ONCE DAILY AS  NEEDED 01/08/20   McKenzie, Candee Furbish, MD  traZODone (DESYREL) 100 MG tablet Take 100 mg by mouth at bedtime.    [provider]     Allergies    Crestor [rosuvastatin], Lipitor [atorvastatin], Pravastatin, Carvedilol, Metformin, Serotonin reuptake inhibitors (ssris), Zocor [simvastatin], and Codeine   Review of Systems   Review of Systems Please see HPI for pertinent positives and negatives  Physical Exam BP 100/62    Pulse 86    Temp (S) (!) 101.2 F (38.4 C) (Rectal) Comment: MD aware   Resp 16    Ht 5' 9.5" (1.765 m)    Wt 86.2 kg    SpO2 97%    BMI 27.66 kg/m   Physical Exam Vitals and nursing note  reviewed.  Constitutional:      Appearance: Normal appearance.  HENT:     Head: Normocephalic and atraumatic.     Nose: Nose normal.     Mouth/Throat:     Mouth: Mucous membranes are moist.  Eyes:     Extraocular Movements: Extraocular movements intact.     Conjunctiva/sclera: Conjunctivae normal.  Cardiovascular:     Rate and Rhythm: Normal rate.  Pulmonary:     Effort: Pulmonary effort is normal.     Breath sounds: Normal breath sounds.  Abdominal:     General: Abdomen is flat.     Palpations: Abdomen is soft.     Tenderness: There is no abdominal tenderness.  Musculoskeletal:        General: No swelling. Normal range of motion.     Cervical back: Neck supple.  Skin:    General: Skin is warm and dry.     Comments: Small are of superficial skin break down (abrasion vs excoriation) on L shoulder without significant erythema or warmth. There is a large area of induration, warmth and tenderness to R buttock extending to the peri-rectal area, no focal fluctuance.   Neurological:     General: No focal deficit present.     Mental Status: He is alert.  Psychiatric:        Mood and Affect: Mood normal.    ED Results / Procedures / Treatments   EKG None  Procedures Procedures  Medications Ordered in the ED Medications  lactated ringers infusion ( Intravenous Rate/Dose Change 08/28/21 1940)  cefTRIAXone (ROCEPHIN) 2 g in sodium chloride 0.9 % 100 mL IVPB (0 g Intravenous Stopped 08/28/21 2032)  piperacillin-tazobactam (ZOSYN) IVPB 2.25 g (has no administration in time range)  acetaminophen (TYLENOL) tablet 650 mg (650 mg Oral Given 08/28/21 1909)  lactated ringers bolus 500 mL (0 mLs Intravenous Stopped 08/28/21 1952)    Initial Impression and Plan  Patient confirmed to be febrile on my evaluation (not febrile on initial triage) but otherwise vitals are normal. Will check labs and send for CT to evaluate abscess vs cellulitis.   ED Course   Clinical Course as of 08/28/21 2055   Thu Aug 28, 2021  1909 UA neg for infection.  [CS]  1909 CBC shows elevated WBC, with fever he now meets sepsis criteria. Will initiate the protocol and begin empiric Abx for skin/soft tissue source.  [CS]  1928 Lactic acid is neg.  [CS]  1928 BP is trending down, will give additional IVF beyond initial small volume bolus.  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 Otherwise unremarkable. He will have to have a non-contrasted scan which may limit the ability to discern infectious source.  [CS]  1951 Patient's BP is  not confirmed to be low. With the normal lactic acid and his other renal and cardiac problems will hold off on giving a full 30cc/kg bolus unless he shows other signs of improving shock.  [CS]  2026 Covid/Flu are neg.  [CS]  2046 CT images and results reviewed, no abscess in need of drainage. Given sepsis criteria, will discuss admission with Hospitalist. Patient and wife are in agreement.  [CS]  2054 Discussed with Dr. Bridgett Larsson, Hospitalist, who will accept for admission. He requests Zosyn and Vanc for anaerobe and MRSA coverage. Renal dosing ordered.  [CS]    Clinical Course User Index [CS] Truddie Hidden, MD     MDM Rules/Calculators/A&P Medical Decision Making Problems Addressed: Cellulitis of right buttock: acute illness or injury with systemic symptoms Sepsis without acute organ dysfunction, due to unspecified organism [facility]): acute illness or injury with systemic symptoms that poses a threat to life or bodily functions  Amount and/or Complexity of Data Reviewed Independent Historian: spouse Labs: ordered. Decision-making details documented in ED Course. Radiology: ordered and independent interpretation performed. Decision-making details documented in ED Course.  Risk Prescription drug management. Decision regarding hospitalization.    Final Clinical Impression(s) / ED Diagnoses Final diagnoses:  Cellulitis of right buttock  Sepsis without acute organ dysfunction, due  to unspecified organism [facility])    Rx / DC Orders ED Discharge Orders     None        Truddie Hidden, MD 08/28/21 2056

## 2021-08-28 NOTE — ED Triage Notes (Signed)
Abscess on his buttocks off and on for months. Fever, sob and chills x 3 days. He was started on an antibiotic today.

## 2021-08-28 NOTE — Progress Notes (Signed)
Pharmacy Antibiotic Note  [name] is a 76 y.o. male admitted on 08/28/2021 with cellulitis. Patient febrile at home to 102.47F. 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 Pharmacy has been consulted for vancomycin dosing.  Patient now afebrile (Tmax 101.2), WBC elevated at 15.2. Scr today at 3.85, which appears to be at baseline for patient (~3.7).   Plan: Vancomycin IV 1500 mg x 1 Start Vancomycin IV 1000 mg q48h (estimated AUC 448) Goal AUC 400-550 Monitor renal function and signs of clinical improvement Follow-up blood cultures  Height: 5' 9.5" (176.5 cm) Weight: 86.2 kg (190 lb) IBW/kg (Calculated) : 71.85  Temp (24hrs), Avg:100 F (37.8 C), Min:98.8 F (37.1 C), Max:101.2 F (38.4 C)  Recent Labs  Lab 08/28/21 1852  WBC 15.2*  CREATININE 3.85*  LATICACIDVEN 1.3    Estimated Creatinine Clearance: 16.6 mL/min (A) (by C-G formula based on SCr of 3.85 mg/dL (H)).    Allergies  Allergen Reactions   Crestor [Rosuvastatin] Other (See Comments)    Muscle aches    Lipitor [Atorvastatin] Other (See Comments)    Muscle aches   Pravastatin Other (See Comments)    Muscle aches   Carvedilol     Other reaction(s): loss of appetite   Metformin Diarrhea   Serotonin Reuptake Inhibitors (Ssris)     Other reaction(s): Muscle aches   Zocor [Simvastatin]     Other reaction(s): Muscle pain   Codeine Itching and Other (See Comments)    Extremity tingling-- can take synthetic    Antimicrobials this admission: 1/5 vancomycin >>  1/5 Zosyn x 1 1/5 Ceftriaxone x 1  Dose adjustments this admission: none  Microbiology results: 1/5 BCx: pending  Thank you for involving pharmacy in this patient's care.  Elita Quick, PharmD PGY1 Ambulatory Care Pharmacy Resident 08/28/2021 10:03 PM  **Pharmacist phone directory can be found on [email or web address] listed under [place]**

## 2021-08-28 NOTE — ED Notes (Addendum)
[facility] arrived to transport pt to [facility]. Report given to paramedic at bedside.

## 2021-08-28 NOTE — Progress Notes (Signed)
  [facility] will assume care on arrival to accepting facility. Until arrival, care as per EDP. However, [facility] available 24/7 for questions and assistance.   Nursing staff please page [facility] Admits and Consults ([phone]) as soon as the patient arrives to the hospital.  Dierra Riesgo, DO [facility]  

## 2021-08-29 DIAGNOSIS — L03317 Cellulitis of buttock: Secondary | ICD-10-CM | POA: Diagnosis not present

## 2021-08-29 DIAGNOSIS — A419 Sepsis, unspecified organism: Secondary | ICD-10-CM | POA: Diagnosis present

## 2021-08-29 LAB — CBC
HCT: 30.4 % — ABNORMAL LOW (ref 39.0–52.0)
Hemoglobin: 10.3 g/dL — ABNORMAL LOW (ref 13.0–17.0)
MCH: 33.1 pg (ref 26.0–34.0)
MCHC: 33.9 g/dL (ref 30.0–36.0)
MCV: 97.7 fL (ref 80.0–100.0)
Platelets: 133 10*3/uL — ABNORMAL LOW (ref 150–400)
RBC: 3.11 MIL/uL — ABNORMAL LOW (ref 4.22–5.81)
RDW: 13.2 % (ref 11.5–15.5)
WBC: 14.1 10*3/uL — ABNORMAL HIGH (ref 4.0–10.5)
nRBC: 0 % (ref 0.0–0.2)

## 2021-08-29 LAB — BASIC METABOLIC PANEL
Anion gap: 7 (ref 5–15)
BUN: 44 mg/dL — ABNORMAL HIGH (ref 8–23)
CO2: 22 mmol/L (ref 22–32)
Calcium: 9.4 mg/dL (ref 8.9–10.3)
Chloride: 102 mmol/L (ref 98–111)
Creatinine, Ser: 3.97 mg/dL — ABNORMAL HIGH (ref 0.61–1.24)
GFR, Estimated: 15 mL/min — ABNORMAL LOW (ref >60)
Glucose, Bld: 120 mg/dL — ABNORMAL HIGH (ref 70–99)
Potassium: 3.8 mmol/L (ref 3.5–5.1)
Sodium: 131 mmol/L — ABNORMAL LOW (ref 135–145)

## 2021-08-29 LAB — GLUCOSE, CAPILLARY
Glucose-Capillary: 120 mg/dL — ABNORMAL HIGH (ref 70–99)
Glucose-Capillary: 135 mg/dL — ABNORMAL HIGH (ref 70–99)
Glucose-Capillary: 127 mg/dL — ABNORMAL HIGH (ref 70–99)
Glucose-Capillary: 110 mg/dL — ABNORMAL HIGH (ref 70–99)

## 2021-08-29 MED ORDER — SODIUM CHLORIDE 0.9 % IV SOLN
2.0000 g | INTRAVENOUS | Status: DC
  Administered 2021-08-29 – 2021-09-02 (×5): 2 g via INTRAVENOUS
  Filled 2021-08-29 (×5): qty 2

## 2021-08-29 MED ORDER — ACETAMINOPHEN 325 MG PO TABS: 650.0000 mg | ORAL_TABLET | Freq: Four times a day (QID) | ORAL | Status: DC | PRN

## 2021-08-29 MED ORDER — VANCOMYCIN HCL 500 MG/100ML IV SOLN
500.0000 mg | Freq: Once | INTRAVENOUS | Status: AC
  Administered 2021-08-29: 500 mg via INTRAVENOUS
  Filled 2021-08-29: qty 100

## 2021-08-29 MED ORDER — AMIODARONE HCL 100 MG PO TABS
100.0000 mg | ORAL_TABLET | Freq: Every day | ORAL | Status: DC
  Administered 2021-08-29 – 2021-09-03 (×6): 100 mg via ORAL
  Filled 2021-08-29 (×6): qty 1

## 2021-08-29 MED ORDER — INSULIN ASPART 100 UNIT/ML IJ SOLN
0.0000 [IU] | Freq: Every day | INTRAMUSCULAR | Status: DC
  Administered 2021-09-01: 4 [IU] via SUBCUTANEOUS

## 2021-08-29 MED ORDER — SIMETHICONE 80 MG PO CHEW
80.0000 mg | CHEWABLE_TABLET | Freq: Four times a day (QID) | ORAL | Status: DC
  Administered 2021-08-29 – 2021-09-03 (×16): 80 mg via ORAL
  Filled 2021-08-29 (×17): qty 1

## 2021-08-29 MED ORDER — OXYCODONE-ACETAMINOPHEN 5-325 MG PO TABS
1.0000 | ORAL_TABLET | ORAL | Status: DC | PRN
  Administered 2021-08-29 – 2021-08-31 (×3): 2 via ORAL
  Administered 2021-09-01 – 2021-09-02 (×3): 1 via ORAL
  Administered 2021-09-02: 2 via ORAL
  Administered 2021-09-02: 1 via ORAL
  Filled 2021-08-29: qty 1
  Filled 2021-08-29 (×3): qty 2
  Filled 2021-08-29 (×3): qty 1
  Filled 2021-08-29 (×2): qty 2

## 2021-08-29 MED ORDER — ACETAMINOPHEN 650 MG RE SUPP: 650.0000 mg | Freq: Four times a day (QID) | RECTAL | Status: DC | PRN

## 2021-08-29 MED ORDER — INSULIN ASPART 100 UNIT/ML IJ SOLN
0.0000 [IU] | Freq: Three times a day (TID) | INTRAMUSCULAR | Status: DC
  Administered 2021-09-02 (×3): 2 [IU] via SUBCUTANEOUS
  Administered 2021-09-03: 1 [IU] via SUBCUTANEOUS

## 2021-08-29 MED ORDER — PANTOPRAZOLE SODIUM 40 MG PO TBEC: 40.0000 mg | DELAYED_RELEASE_TABLET | Freq: Every day | ORAL | Status: DC | PRN

## 2021-08-29 MED ORDER — NYSTATIN 100000 UNIT/GM EX POWD
Freq: Three times a day (TID) | CUTANEOUS | Status: DC
  Administered 2021-08-29: 1 via TOPICAL
  Filled 2021-08-29: qty 15

## 2021-08-29 MED ORDER — LACTATED RINGERS IV SOLN: INTRAVENOUS | Status: DC

## 2021-08-29 MED ORDER — TRAZODONE HCL 100 MG PO TABS
100.0000 mg | ORAL_TABLET | Freq: Every day | ORAL | Status: DC
  Administered 2021-08-29 – 2021-09-02 (×5): 100 mg via ORAL
  Filled 2021-08-29 (×5): qty 1

## 2021-08-29 NOTE — Consult Note (Signed)
[name] 09/06/1945  [id number].    Requesting MD: Verlon Au, MD Chief Complaint/Reason for Consult: buttock cellulitis   HPI:  [name] is a 76 year old male with multiple medical problems including, but not limited to, stroke, A. fib on Eliquis, CAD status post CABG, CHF, and history of endocarditis 03/2021, who presented to the emergency department with a chief complaint of fever.  He reports 3 days of fever up to 101.  He also reports buttock pain.  States that about a week ago he had a boil on his left buttock, and his right buttock, that spontaneously drained for about 1 week.  States he was showering daily and changing bandages daily.  His wife is an Therapist, sports.  This got better, but then he developed fever and his wife encouraged him to get medical attention.  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  He states these are always superficial and drained spontaneously.  He denies of a history of perianal or perirectal abscess requiring surgical drainage. He reports regular bowel movements that are formed and nonbloody.  He takes stool softeners and MiraLAX at home.  He reports urinary incontinence at baseline and has to wear depends or pads.  At baseline he lives at home with his wife.  He ambulates independently.  States he spends most of the day sitting and his mobility is limited by deconditioning and DOE.  Denies chest pain with mobilization.  Takes Eliquis at home.  Past abdominal surgeries include laparoscopic Lap-Band many years ago.   CT pelvis in the emergency department revealed perirectal inflammatory changes and soft tissue stranding of the right buttock and general surgery was asked to evaluate.  There is no abscess on CT.  ROS: Review of Systems  Constitutional:  Positive for fever.  Eyes: Negative.   Respiratory: Negative.    Cardiovascular: Negative.   Gastrointestinal: Negative.   Genitourinary: Negative.   Musculoskeletal: Negative.    Neurological: Negative.   Endo/Heme/Allergies: Negative.   Psychiatric/Behavioral: Negative.    All other systems reviewed and are negative.  Family History  Problem Relation Age of Onset   Other Mother        joint problems   Alzheimer's disease Mother    Hypertension Father    Heart disease Father    CVA Father        age 68   Depression Father    Heart disease Sister        CABG   Stroke Sister    Heart failure Sister        congestive   Diabetes Sister    Heart disease Brother    Hypertension Brother    Congestive Heart Failure Brother     Past Medical History:  Diagnosis Date   Allergic rhinitis    Anxiety    Atrial fibrillation ([place])    Cancer ([place])    hx of skin cancers    Chronic kidney disease    Coronary artery disease    a. s/p MI & CABG; b. s/p PCI Diag;  c. Cath 12/2011: LAD diff dzs/small, Diag 75% isr, 90 dist to stent (small), LCX & RCA occluded, VG->OM 40, VG->PDA patent - med rx.   COVID-19    x 3   Depression    PTSD   Diabetes mellitus    not on medications   Diastolic CHF, chronic ([place])    a. EF 55-60% by echo 2007   GERD (gastroesophageal reflux disease)    "  not anymore" " I had a lap band"   History of diverticulitis of colon    5 YRS AGO   History of pneumonia    2017   History of transient ischemic attack (TIA)    CAROTID DOPPLERS NOV 2011  0-39& BIL. 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   Complex valvuloplasty including artificial Gore-tex neochord placement x4, plication of anterior commissure, and 26 mm Sorin Memo 3D ring annuloplasty via right mini thoracotomy approach   Shortness of breath  dyspnea    with exertion   Skin cancer    Sleep apnea    cpap- see ov note in EPIC 05/12/11 for settings    Stroke [facility])    hx of   Stroke [facility])     Past Surgical History:  Procedure Laterality Date   AV FISTULA PLACEMENT Left 08/02/2018   Procedure: ARTERIOVENOUS (AV) FISTULA CREATION RADIOCEPHALIC;  Surgeon: Angelia Mould, MD;  Location: [place];  Service: Vascular;  Laterality: Left;   BLEPHAROPLASTY  [place]  2001, 2004, 2009, 2011   CARDIAC CATHETERIZATION N/A 01/23/2016   Procedure: Right/Left Heart Cath and Coronary Angiography;  Surgeon: [name], MD;  Location: [place] CV LAB;  Service: Cardiovascular;  Laterality: N/A;   CARDIOVERSION N/A 09/07/2017   Procedure: CARDIOVERSION;  Surgeon: [name], MD;  Location: [facility] ENDOSCOPY;  Service: Cardiovascular;  Laterality: N/A;   CARDIOVERSION N/A 09/13/2019   Procedure: CARDIOVERSION;  Surgeon: [name], MD;  Location: [facility] ENDOSCOPY;  Service: Cardiovascular;  Laterality: N/A;   CARDIOVERSION N/A 10/19/2019   Procedure: CARDIOVERSION;  Surgeon: [name], MD;  Location: [name] END[facility];  Service: Cardiovascular;  Laterality: N/A;   CATARACT EXTRACTION W/ INTRAOCULAR LENS IMPLANT Bilateral    CHONDROPLASTY  07/22/2011   Procedure: CHONDROPLASTY;  Surgeon: Dione Plover Aluisio;  Location: [place];  Service: Orthopedics;;   CIRCUMCISION  [place]-- POST CABG   W/ STENT, last cath 01/07/2012    CORONARY ARTERY BYPASS GRAFT  1995   X3 VESSEL   CORONARY ARTERY BYPASS GRAFT  1995   CORONARY STENT PLACEMENT     CYSTOSCOPY/URETEROSCOPY/HOLMIUM LASER/STENT PLACEMENT Right 01/08/2021   Procedure: CYSTOSCOPY/RETROGRADE/URETEROSCOPY/HOLMIUM LASER/STENT PLACEMENT;  Surgeon: Franchot Gallo, MD;  Location: [facility];  Service: Urology;  Laterality: Right;   IR PERC TUN PERIT CATH WO PORT S&I /IMAG  03/06/2021   IR  REMOVAL TUN CV CATH W/O FL  04/14/2021   KNEE ARTHROSCOPY  07/22/2011   Procedure: ARTHROSCOPY KNEE;  Surgeon: Dione Plover Aluisio;  Location: [place];  Service: Orthopedics;  Laterality: Right;  WITH DEBRIDEMENT    KNEE ARTHROSCOPY W/ MENISCECTOMY  X2 IN 2002-- RIGHT KNEE   LAPAROSCOPIC GASTRIC BANDING  01-15-09   LEFT HEART CATH AND CORONARY ANGIOGRAPHY N/A 04/29/2017   Procedure: LEFT HEART CATH AND CORONARY ANGIOGRAPHY;  Surgeon: [name], MD;  Location: [place] CV LAB;  Service: Cardiovascular;  Laterality: N/A;   LEFT HEART CATHETERIZATION  WITH CORONARY ANGIOGRAM N/A 09/11/2013   Procedure: LEFT HEART CATHETERIZATION WITH CORONARY ANGIOGRAM;  Surgeon: [name], MD;  Location: [place] [name] CATH LAB;  Service: Cardiovascular;  Laterality: N/A;   MITRAL VALVE REPAIR Right 03/25/2016   Procedure: MINIMALLY INVASIVE REOPERATION FOR MITRAL VALVE REPAIR  (MVR) with size 26 Sorin Memo 3D;  Surgeon: [name], MD;  Location: [place];  Service: Open Heart Surgery;  Laterality: Right;   MITRAL VALVE REPAIR  03/2016   RIGHT KNEE ED COMPARTMENT REPLACEMENT  2004   SYNOVECTOMY  07/22/2011   Procedure: SYNOVECTOMY;  Surgeon: Dione Plover Aluisio;  Location: [place];  Service: Orthopedics;;   TEE WITHOUT CARDIOVERSION N/A 01/23/2016   Procedure: TRANSESOPHAGEAL ECHOCARDIOGRAM (TEE);  Surgeon: [name], MD;  Location: [place];  Service: Cardiovascular;  Laterality: N/A;   TEE WITHOUT CARDIOVERSION N/A 03/25/2016   Procedure: TRANSESOPHAGEAL ECHOCARDIOGRAM (TEE);  Surgeon: [name], MD;  Location: [place];  Service: Open Heart Surgery;  Laterality: N/A;   TEE WITHOUT CARDIOVERSION N/A 03/04/2021   Procedure: TRANSESOPHAGEAL ECHOCARDIOGRAM (TEE);  Surgeon: Lelon Perla, MD;  Location: [facility] ENDOSCOPY;  Service: Cardiovascular;  Laterality: N/A;   UMBILICAL HERNIA REPAIR  01-15-09   W/ GASTRIC BANDING PROCEDURE    Social History:  reports that he has  never smoked. He has never used smokeless tobacco. He reports that he does not currently use alcohol after a past usage of about 2.0 standard drinks per week. He reports that he does not use drugs.  Allergies:  Allergies  Allergen Reactions   Crestor [Rosuvastatin] Other (See Comments)    Muscle aches    Lipitor [Atorvastatin] Other (See Comments)    Muscle aches   Pravastatin Other (See Comments)    Muscle aches   Carvedilol     Other reaction(s): loss of appetite   Metformin Diarrhea   Serotonin Reuptake Inhibitors (Ssris)     Other reaction(s): Muscle aches   Zocor [Simvastatin]     Other reaction(s): Muscle pain   Codeine Itching and Other (See Comments)    Extremity tingling-- can take synthetic    Medications Prior to Admission  Medication Sig Dispense Refill   acetaminophen (TYLENOL) 325 MG tablet Take 2 tablets (650 mg total) by mouth every 6 (six) hours as needed for mild pain (or Fever >/= 101).     Alirocumab (PRALUENT) 75 MG/ML SOAJ Inject 75 mg into the skin every 14 (fourteen) days.     allopurinol (ZYLOPRIM) 100 MG tablet Take 0.5 tablets (50 mg total) by mouth 2 (two) times a week. Please discuss dosing with your PCP based on your renal function. 4 tablet 0   amiodarone (PACERONE) 200 MG tablet Take 0.5 tablets (100 mg total) by mouth daily. 60 tablet 0   apixaban (ELIQUIS) 5 MG TABS tablet Take 1 tablet (5 mg total) by mouth 2 (two) times daily. 180 tablet 3   Ascorbic Acid (VITAMIN C WITH ROSE HIPS) 1000 MG tablet Take 1,000 mg by mouth daily.     calcitRIOL (ROCALTROL) 0.25 MCG capsule Take 0.25 mcg by mouth every other day.     Carboxymethylcellulose Sodium 1 % GEL APPLY 1 DROP TO EACH EYE AT BEDTIME     cetirizine (ZYRTEC) 10 MG tablet Take 10 mg by mouth daily.     colchicine 0.6 MG tablet Take 0.6-1.2 mg by mouth 2 (two) times daily as needed (gout).      doxycycline (VIBRAMYCIN) 100 MG capsule Take 100 mg  by mouth 2 (two) times daily. For 7 days     EFUDEX  5 % cream Apply 1 application topically daily.     fluticasone (FLONASE) 50 MCG/ACT nasal spray Place 1 spray into both nostrils daily as needed for allergies or rhinitis.     furosemide (LASIX) 80 MG tablet Take 80 mg by mouth daily.     nitroGLYCERIN (NITROSTAT) 0.4 MG SL tablet 1 TAB UNDER THE TONGUE EVERY 5 MINUTES AS NEEDED FOR CHEST PAIN UP TO 3 DOSES, IF PERSIST CALL 911 75 tablet 1   ONETOUCH VERIO test strip 1 each daily.     pantoprazole (PROTONIX) 40 MG tablet Take 40 mg by mouth daily as needed (heartburn).     potassium chloride SA (KLOR-CON) 20 MEQ tablet Take 20 mEq by mouth daily.     Semaglutide,0.25 or 0.5MG /DOS, (OZEMPIC, 0.25 OR 0.5 MG/DOSE,) 2 MG/1.5ML SOPN Inject 0.375 mLs (0.5 mg total) into the skin every Thursday. 3 pen 3   tadalafil (CIALIS) 20 MG tablet TAKE 1 TABLET BY MOUTH ONCE DAILY AS NEEDED 10 tablet 0   traZODone (DESYREL) 100 MG tablet Take 100 mg by mouth at bedtime.       Physical Exam: Blood pressure 99/64, pulse 80, temperature 98.9 F (37.2 C), resp. rate 16, height 5' 9.5" (1.765 m), weight 86.2 kg, SpO2 97 %. General: Pleasant white male  laying on [name] bed, appears stated age, NAD. HEENT: head -normocephalic, atraumatic; Eyes: PERRLA, anicteric sclerae Neck- Trachea is midline, no thyromegaly CV- RRR,  M/R/G, radial and dorsalis pedis pulses 2+ BL Pulm- breathing is non-labored. CTABL, no wheezes, rhales, rhonchi. Abd- soft, NT/ND, appropriate bowel sounds in 4 quadrants, no organomegaly. GU: no penile lesions, no scrotal edema Mild discoloration of the intergluteal cleft bilaterally, appears chronic and pressure related.  There is no blanching erythema or cellulitis.  Mild induration of the right intergluteal cleft with a small less than 0.5 cm boil.  There is no drainage or bleeding. External perianal exam appears normal. No lesions.  MSK- UE/LE symmetrical, no cyanosis, clubbing, or edema. Neuro- CN II-XII grossly in tact, no  paresthesias. Psych- Alert and Oriented x3 with appropriate affect Skin: warm and dry, no rashes or lesions    Results for orders placed or performed during the [name] encounter of 08/28/21 (from the past 48 hour(s))  Comprehensive metabolic panel     Status: Abnormal   Collection Time: 08/28/21  6:52 PM  Result Value Ref Range   Sodium 133 (L) 135 - 145 mmol/L   Potassium 3.9 3.5 - 5.1 mmol/L   Chloride 100 98 - 111 mmol/L   CO2 20 (L) 22 - 32 mmol/L   Glucose, Bld 158 (H) 70 - 99 mg/dL    Comment: Glucose reference range applies only to samples taken after fasting for at least 8 hours.   BUN 43 (H) 8 - 23 mg/dL   Creatinine, Ser 3.85 (H) 0.61 - 1.24 mg/dL   Calcium 10.1 8.9 - 10.3 mg/dL   Total Protein 6.7 6.5 - 8.1 g/dL   Albumin 3.9 3.5 - 5.0 g/dL   AST 20 15 - 41 U/L   ALT 15 0 - 44 U/L   Alkaline Phosphatase 78 38 - 126 U/L   Total Bilirubin 1.2 0.3 - 1.2 mg/dL   GFR, Estimated 15 (L) >60 mL/min    Comment: (NOTE) Calculated using the CKD-EPI Creatinine Equation (2021)    Anion gap 13 5 - 15    Comment: Performed  at [facility], [address] [place], [place] [address]  Lactic acid, plasma     Status: None   Collection Time: 08/28/21  6:52 PM  Result Value Ref Range   Lactic Acid, Venous 1.3 0.5 - 1.9 mmol/L    Comment: Performed at [facility], [address] [place], [place] [address]  CBC with Differential     Status: Abnormal   Collection Time: 08/28/21  6:52 PM  Result Value Ref Range   WBC 15.2 (H) 4.0 - 10.5 K/uL   RBC 3.47 (L) 4.22 - 5.81 MIL/uL   Hemoglobin 11.5 (L) 13.0 - 17.0 g/dL   HCT 33.6 (L) 39.0 - 52.0 %   MCV 96.8 80.0 - 100.0 fL   MCH 33.1 26.0 - 34.0 pg   MCHC 34.2 30.0 - 36.0 g/dL   RDW 13.2 11.5 - 15.5 %   Platelets 149 (L) 150 - 400 K/uL   nRBC 0.0 0.0 - 0.2 %   Neutrophils Relative % 90 %   Neutro Abs 13.7 (H) 1.7 - 7.7 K/uL   Lymphocytes Relative 3 %   Lymphs Abs 0.5 (L) 0.7 - 4.0 K/uL   Monocytes Relative  5 %   Monocytes Absolute 0.8 0.1 - 1.0 K/uL   Eosinophils Relative 1 %   Eosinophils Absolute 0.1 0.0 - 0.5 K/uL   Basophils Relative 0 %   Basophils Absolute 0.1 0.0 - 0.1 K/uL   Immature Granulocytes 1 %   Abs Immature Granulocytes 0.08 (H) 0.00 - 0.07 K/uL    Comment: Performed at [facility], [address] [place], [place] [address]  Urinalysis, Routine w reflex microscopic Peripheral     Status: Abnormal   Collection Time: 08/28/21  6:52 PM  Result Value Ref Range   Color, Urine YELLOW YELLOW   APPearance CLEAR CLEAR   Specific Gravity, Urine 1.015 1.005 - 1.030   pH 5.5 5.0 - 8.0   Glucose, UA NEGATIVE NEGATIVE mg/dL   Hgb urine dipstick TRACE (A) NEGATIVE   Bilirubin Urine NEGATIVE NEGATIVE   Ketones, ur NEGATIVE NEGATIVE mg/dL   Protein, ur NEGATIVE NEGATIVE mg/dL   Nitrite NEGATIVE NEGATIVE   Leukocytes,Ua NEGATIVE NEGATIVE    Comment: Performed at [name], [address] [place], [place] [address]  Urinalysis, Microscopic (reflex)     Status: Abnormal   Collection Time: 08/28/21  6:52 PM  Result Value Ref Range   RBC / HPF 0-5 0 - 5 RBC/hpf   WBC, UA 0-5 0 - 5 WBC/hpf   Bacteria, UA RARE (A) NONE SEEN   Squamous Epithelial / LPF 0-5 0 - 5    Comment: Performed at [name], [address] [place], [place] [address]  Culture, blood (routine x 2)     Status: None (Preliminary result)   Collection Time: 08/28/21  6:54 PM   Specimen: BLOOD RIGHT FOREARM  Result Value Ref Range   Specimen Description      BLOOD RIGHT FOREARM Performed at [name], [address] [place], [place] [address]    Special Requests      BOTTLES DRAWN AEROBIC AND ANAEROBIC Blood Culture adequate volume Performed at [facility], [name] [place], [place] [address]    Culture      NO GROWTH < 12 HOURS Performed at [name], [place] [place], [place] [address]  Report Status PENDING    Culture, blood (routine x 2)     Status: None (Preliminary result)   Collection Time: 08/28/21  6:54 PM   Specimen: BLOOD RIGHT HAND  Result Value Ref Range   Specimen Description      BLOOD RIGHT HAND Performed at [name], [address] [place], [place] [address]    Special Requests      BOTTLES DRAWN AEROBIC AND ANAEROBIC Blood Culture adequate volume Performed at [facility], [address] [place], [place] [address]    Culture      NO GROWTH < 12 HOURS Performed at [name], [place] [address], [place],  [address]    Report Status PENDING   Resp Panel by RT-PCR (Flu A&B, Covid) Nasopharyngeal Swab     Status: None   Collection Time: 08/28/21  7:34 PM   Specimen: Nasopharyngeal Swab; Nasopharyngeal(NP) swabs in vial transport medium  Result Value Ref Range   SARS Coronavirus 2 by RT PCR NEGATIVE NEGATIVE    Comment: (NOTE) SARS-CoV-2 target nucleic acids are NOT DETECTED.  The SARS-CoV-2 RNA is generally detectable in upper respiratory specimens during the acute phase of infection. The lowest concentration of SARS-CoV-2 viral copies this assay can detect is 138 copies/mL. A negative result does not preclude SARS-Cov-2 infection and should not be used as the sole basis for treatment or other patient management decisions. A negative result may occur with  improper specimen collection/handling, submission of specimen other than nasopharyngeal swab, presence of viral mutation(s) within the areas targeted by this assay, and inadequate number of viral copies(<138 copies/mL). A negative result must be combined with clinical observations, patient history, and epidemiological information. The expected result is Negative.  Fact Sheet for Patients:  EntrepreneurPulse.com.au  Fact Sheet for Healthcare Providers:  IncredibleEmployment.be  This test is no t yet approved or cleared by the [place]  FDA and  has been authorized for detection and/or diagnosis of SARS-CoV-2 by FDA under an Emergency Use Authorization (EUA). This EUA will remain  in effect (meaning this test can be used) for the duration of the COVID-19 declaration under Section 564(b)(1) of the Act, 21 U.S.C.section 360bbb-3(b)(1), unless the authorization is terminated  or revoked sooner.       Influenza A by PCR NEGATIVE NEGATIVE   Influenza B by PCR NEGATIVE NEGATIVE    Comment: (NOTE) The Xpert Xpress SARS-CoV-2/FLU/RSV plus assay is intended as an aid in the diagnosis of influenza from Nasopharyngeal swab specimens and should not be used as a sole basis for treatment. Nasal washings and aspirates are unacceptable for Xpert Xpress SARS-CoV-2/FLU/RSV testing.  Fact Sheet for Patients: EntrepreneurPulse.com.au  Fact Sheet for Healthcare Providers: IncredibleEmployment.be  This test is not yet approved or cleared by the [place] FDA and has been authorized for detection and/or diagnosis of SARS-CoV-2 by FDA under an Emergency Use Authorization (EUA). This EUA will remain in effect (meaning this test can be used) for the duration of the COVID-19 declaration under Section 564(b)(1) of the Act, 21 U.S.C. section 360bbb-3(b)(1), unless the authorization is terminated or revoked.  Performed at [facility], [address] [place], [place] [address]   Basic metabolic panel     Status: Abnormal   Collection Time: 08/29/21  7:21 AM  Result Value Ref Range   Sodium 131 (L) 135 - 145 mmol/L   Potassium 3.8 3.5 - 5.1 mmol/L   Chloride 102 98 -  111 mmol/L   CO2 22 22 - 32 mmol/L   Glucose, Bld 120 (H) 70 - 99 mg/dL    Comment: Glucose reference range applies only to samples taken after fasting for at least 8 hours.   BUN 44 (H) 8 - 23 mg/dL   Creatinine, Ser 3.97 (H) 0.61 - 1.24 mg/dL   Calcium 9.4 8.9 - 10.3 mg/dL   GFR, Estimated 15 (L) >60 mL/min     Comment: (NOTE) Calculated using the CKD-EPI Creatinine Equation (2021)    Anion gap 7 5 - 15    Comment: Performed at [name], [place] [address], [place], [place] [address]  CBC     Status: Abnormal   Collection Time: 08/29/21  7:21 AM  Result Value Ref Range   WBC 14.1 (H) 4.0 - 10.5 K/uL   RBC 3.11 (L) 4.22 - 5.81 MIL/uL   Hemoglobin 10.3 (L) 13.0 - 17.0 g/dL   HCT 30.4 (L) 39.0 - 52.0 %   MCV 97.7 80.0 - 100.0 fL   MCH 33.1 26.0 - 34.0 pg   MCHC 33.9 30.0 - 36.0 g/dL   RDW 13.2 11.5 - 15.5 %   Platelets 133 (L) 150 - 400 K/uL   nRBC 0.0 0.0 - 0.2 %    Comment: Performed at [facility], [place] [address], [place], [place] [address]  Glucose, capillary     Status: Abnormal   Collection Time: 08/29/21  7:59 AM  Result Value Ref Range   Glucose-Capillary 120 (H) 70 - 99 mg/dL    Comment: Glucose reference range applies only to samples taken after fasting for at least 8 hours.   CT PELVIS WO CONTRAST  Result Date: 08/28/2021 CLINICAL DATA:  Anal/rectal abscess. EXAM: CT PELVIS WITHOUT CONTRAST TECHNIQUE: Multidetector CT imaging of the pelvis was performed following the standard protocol without intravenous contrast. COMPARISON:  CT abdomen and pelvis 01/07/2021. FINDINGS: Urinary Tract:  No abnormality visualized. Bowel: No dilated bowel loops are seen. There is sigmoid colon diverticulosis without evidence for acute diverticulitis. The visualized portion of the appendix is within normal limits. Vascular/Lymphatic: There are atherosclerotic calcifications of the aorta. The visualized aorta is normal in size. No enlarged lymph nodes are identified. Reproductive:  Prostate gland is mildly enlarged. Other: There is no ascites or free air. There are small fat containing bilateral inguinal hernias. There is ill-defined inflammatory stranding surrounding the posterior/right rectum extending into the subcutaneous tissues of the right buttocks. No soft tissue  gas identified. No obvious fluid collections allowing for lack of intravenous contrast. No evidence for foreign body. Musculoskeletal: No suspicious bone lesions identified. Degenerative changes are seen at L4-L5. IMPRESSION: 1. Posterior and right perirectal inflammatory stranding extending into the buttocks. Findings may represent cellulitis or phlegmon. No evidence for soft tissue gas or obvious drainable fluid collection. 2. Colonic diverticulosis. 3.  Aortic Atherosclerosis (ICD10-I70.0). Electronically Signed   By: Ronney Asters M.D.   On: 08/28/2021 20:26      Assessment/Plan Right buttock cellulitis On my exam there is no true cellulitis. This would not explain his fever. Per patient it has improved significantly. There is no abscess. Nothing drainable. Would recommend trying to keep the area clean and dry. Apply a barrier cream to the intergluteal cleft to help wick moisture. No surgical needs and no need for surgical follow up. We will sign off, please call as needed.   [place], [facility] 08/29/2021, 11:01  AM Please see Amion for pager number during day hours 7:00am-4:30pm or 7:00am -11:30am on weekends

## 2021-08-29 NOTE — Plan of Care (Signed)
°  Problem: Education: Goal: Knowledge of General Education information will improve Description: Including pain rating scale, medication(s)/side effects and non-pharmacologic comfort measures Outcome: Progressing   Problem: Health Behavior/Discharge Planning: Goal: Ability to manage health-related needs will improve Outcome: Progressing   Problem: Skin Integrity: Goal: Risk for impaired skin integrity will decrease Outcome: Progressing   Problem: Pain Managment: Goal: General experience of comfort will improve Outcome: Progressing

## 2021-08-29 NOTE — Progress Notes (Addendum)
Seen and examined after midnight  75 year old home dwelling white male Chronic A. 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  Tells me he has had COVID 3 times and has been weak recently and not been able to ambulate as he usually does otherwise he is pretty independent He also reports that he has had some discomfort and 3 episodes of what appear to be abscesses draining from his buttock He tells me that he has large stools and that irritate the area and feels that he may be developing a rash  On exam when I saw him he has mild tenderness in the right ischiorectal region which is significantly improved from prior--it seems that he had a lot more pain yesterday he rates the pain right now at maybe a 3 or 4 on 10 I have held his Eliquis at this time in case surgical intervention or procedures needed but resume some of his home meds including trazodone and amiodarone   we can start heparin if there is concern for need for surgery otherwise we will resume Eliquis in the next 24 hours    I have asked general surgery to see him this morning with regards to possible surgical management of this area although there is no pointing abscess given the fact that he has had this several times in the recent past leads me to think he will need some sort of evaluation I am less concerned about this being a fistula he does not have any history of underlying bowel disease  I expect he will be here at least 2 to 3 days

## 2021-08-29 NOTE — Hospital Course (Signed)
76 year old home dwelling white male Chronic A. 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

## 2021-08-29 NOTE — TOC Initial Note (Signed)
Transition of Care [facility]) - Initial/Assessment Note   Patient Details  Name: [name] MRN: [id number] Date of Birth: 1946-06-22  [facility]) CM/SW Contact:    Sherie Don, LCSW Phone Number: 08/29/2021, 11:35 AM  Clinical Narrative: Readmission checklist completed due to high readmission score. CSW met with patient to complete assessment. Per patient, he resides at home with his wife and several other family members. Patient is independent with ADLs at baseline and reported no issues with affording and taking medications as prescribed. Current DME includes a cane, rolling walker, grab bars, and elevated toilets. Patient is not currently active with [facility] services, but does have a history last year with [facility] for nursing with IV infusions. Patient is currently able to transport himself to medical appointments. TOC to follow for possible discharge needs.  Expected Discharge Plan: Home/Self Care Barriers to Discharge: Continued Medical Work up  Patient Goals and CMS Choice Patient states their goals for this hospitalization and ongoing recovery are:: Return home with family Choice offered to / list presented to : NA  Expected Discharge Plan and Services Expected Discharge Plan: Home/Self Care In-house Referral: Clinical Social Work Financial trader, rolling walker, grab bars) Post Acute Care Choice: NA Living arrangements for the past 2 months: Single Family Home            DME Arranged: N/A DME Agency: NA  Prior Living Arrangements/Services Living arrangements for the past 2 months: Single Family Home Lives with:: Relatives, Spouse Patient language and need for interpreter reviewed:: Yes Do you feel safe going back to the place where you live?: Yes      Need for Family Participation in Patient Care: No (Comment) Care giver support system in place?: Yes (comment) Current home services: DME Criminal Activity/Legal Involvement Pertinent to Current Situation/Hospitalization: No -  Comment as needed  Activities of Daily Living Home Assistive Devices/Equipment: CBG Meter, Cane (specify quad or straight), Eyeglasses ADL Screening (condition at time of admission) Patient's cognitive ability adequate to safely complete daily activities?: Yes Is the patient deaf or have difficulty hearing?: No Does the patient have difficulty seeing, even when wearing glasses/contacts?: No Does the patient have difficulty concentrating, remembering, or making decisions?: No Patient able to express need for assistance with ADLs?: Yes Does the patient have difficulty dressing or bathing?: No Independently performs ADLs?: Yes (appropriate for developmental age) Does the patient have difficulty walking or climbing stairs?: Yes Weakness of Legs: Right Weakness of Arms/Hands: Both  Emotional Assessment Appearance:: Appears stated age Attitude/Demeanor/Rapport: Engaged Affect (typically observed): Accepting Orientation: : Oriented to Self, Oriented to Place, Oriented to  Time, Oriented to Situation Alcohol / Substance Use: Not Applicable Psych Involvement: No (comment)  Admission diagnosis:  Cellulitis of right buttock [L03.317] Sepsis without acute organ dysfunction, due to unspecified organism [facility]) [A41.9] Patient Active Problem List   Diagnosis Date Noted   Sepsis ([place]) 08/29/2021   Cellulitis of right buttock 08/28/2021   Enterococcal bacteremia    History of stroke    Prosthetic valve endocarditis ([place])    Bacteremia due to Gram-positive bacteria 02/27/2021   Anemia 02/26/2021   Weakness generalized 02/26/2021   CKD (chronic kidney disease) stage 4, GFR 15-29 ml/min (HCC) 02/26/2021   Atrial fibrillation, chronic (HCC) 02/26/2021   Chronic anticoagulation 02/26/2021   Prolonged QT interval 02/26/2021   Depressive disorder 02/25/2021   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   Squamous cell carcinoma of skin of face 02/25/2021   Actinic keratosis 02/25/2021   Anxiety state  02/25/2021   Basal cell carcinoma of nose 02/25/2021   Basal cell carcinoma of skin of unspecified ear and external auricular canal 02/25/2021   Benign prostatic hyperplasia with lower urinary tract symptoms 02/25/2021   Bilateral pseudophakia 02/25/2021   Carcinoma in situ of skin of other parts of face 02/25/2021   Dental caries on smooth surface penetrating into dentin 02/25/2021   Deposits (accretions) on teeth 02/25/2021   Diverticulitis of colon 02/25/2021   Encounter for fitting and adjustment of hearing aid 02/25/2021   Encounter for removal of sutures 02/25/2021   Esophageal reflux 02/25/2021   Generalized osteoarthrosis, involving multiple sites 02/25/2021   Gout, unspecified 02/25/2021   History of other malignant neoplasm of skin 02/25/2021   Male erectile dysfunction, unspecified 02/25/2021   Peptic ulcer 02/25/2021   Post-traumatic stress disorder, unspecified 02/25/2021   Psychosexual dysfunction with inhibited sexual excitement 02/25/2021   Sensorineural hearing loss, bilateral 02/25/2021   Unspecified atrial fibrillation ([place]) 02/25/2021   Protein-calorie malnutrition, severe 02/04/2021   Cerebral thrombosis with cerebral infarction 02/02/2021   Right sided weakness 02/01/2021   History of COVID-19 01/29/2021   Memory loss 01/29/2021   Olfactory impairment 01/29/2021   Gait disturbance 01/29/2021   Fatigue 01/29/2021   Physical deconditioning 01/29/2021   Pressure injury of skin 01/07/2021   AKI (acute kidney injury) ([place]) 01/06/2021   Generalized weakness 01/06/2021   ARF (acute renal failure) ([place]) 01/06/2021   Dyspnea 10/05/2020   CHF (congestive heart failure) ([place]) 10/04/2020   Carpal tunnel syndrome of right wrist 09/25/2020   Pain of left hand 09/25/2020   COVID-19 virus infection 05/24/2020   Hypertensive chronic kidney disease with stage 1 through stage 4 chronic kidney disease, or unspecified chronic kidney disease    Coronary artery disease    Type  II diabetes mellitus ([place])    Hypertension    Back pain 04/08/2020   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/05/2019   Pain in right foot 03/22/2018   Cellulitis of left foot 03/01/2018   Pain of joint of left ankle and foot 09/06/2017   Cancer ([place])    Other specified counseling 04/03/2016   Pneumothorax on right 03/29/2016   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/25/2016   Atherosclerotic heart disease of native coronary artery without angina pectoris 03/10/2016   Heart failure ([place]) 03/10/2016   Cataract extraction status of eye 03/10/2016   Other long term (current) drug therapy 03/10/2016   Osteoarthritis of knee 03/10/2016   Vitamin D deficiency 03/10/2016   Mitral regurgitation    Avitaminosis D 12/25/2015   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   Arthritis of knee, degenerative 12/25/2015   H/O cataract extraction 12/25/2015   Atherosclerotic heart disease of native coronary artery with other forms of angina pectoris ([place]) 12/25/2015   Congestive heart failure ([place]) 12/25/2015   Pure hypercholesterolemia 12/25/2015   Allergic rhinitis 12/25/2015   Essential (primary) hypertension 12/25/2015   Hypotension 01/29/2014   Expected Blood loss, postoperative 04/29/2012   Pain due to unicompartmental arthroplasty of knee ([place]) 04/27/2012   Preoperative evaluation of a medical condition to rule out surgical contraindications (TAR required) 37/85/8850   Diastolic CHF, chronic ([place]) 06/29/2011   Preoperative evaluation to rule out surgical contraindication 06/29/2011   CAROTID ARTERY STENOSIS 07/09/2010   TIA 05/29/2010   FATIGUE / MALAISE 05/29/2010   CAD, AUTOLOGOUS BYPASS GRAFT 11/15/2008   MYOCARDIAL INFARCTION 12/18/2007   ALLERGIC RHINITIS WITH CONJUNCTIVITIS 12/18/2007   Type 2 diabetes mellitus without complications ([place]) 27/74/1287   Other and  unspecified hyperlipidemia 12/05/2007   Other ill-defined and unknown causes of morbidity and mortality 12/05/2007   Essential  hypertension 12/05/2007   Coronary atherosclerosis 12/05/2007   DEGENERATIVE JOINT DISEASE 12/05/2007   Obstructive sleep apnea of adult 12/05/2007   PCP:  [name], MD Pharmacy:   [facility] [place] [address] [place]) [place] [address] Phone: [phone] Fax: [place], [place] - [address] [place] [address] Phone: [phone] Fax: [phone]  Readmission Risk Interventions Readmission Risk Prevention Plan 08/29/2021  Transportation Screening Complete  HRI or Home Care Consult Complete  Social Work Consult for [place] Planning/Counseling Complete  Palliative Care Screening Not Applicable  Medication Review Press photographer) Complete  Some recent data might be hidden

## 2021-08-29 NOTE — Progress Notes (Signed)
RT NOTE:  Pt requested to go on CPAP machine. Pt stated his home CPAP setting was 8 and that he does not have oxygen bled into the machine he uses at home. Pt states he feels comfortable at this time on the machine.

## 2021-08-29 NOTE — H&P (Signed)
History and Physical    [name] TDD:[id number] DOB: [birth year] DOA: 08/28/2021  PCP: [name], MD Patient coming from: Home  Chief Complaint: Fever  HPI: [name] is a 76 y.o. male with medical history significant of chronic A. fib on Eliquis, skin cancer, CKD stage IV, CAD status post CABG and PCI, depression, anxiety, non-insulin-dependent type 2 diabetes, chronic diastolic CHF, CVA, hyperlipidemia, hypertension, mitral regurgitation status post valve repair, history of prosthetic valve endocarditis, OSA on CPAP presented to the ED with complaints of fever and pain/swelling of his right buttock/perirectal area for a few days.  In the ED, febrile with temperature 101.2 F.  Blood pressure soft with systolic in the 62B.  Labs showing WBC 15.2.  Hemoglobin 11.5, no significant change from baseline.  Bicarb 20, anion gap 13.  Creatinine 3.8, stable compared to recent labs.  Lactic acid normal.  UA not suggestive of infection.  Blood culture x2 drawn.  COVID and influenza PCR negative.  CT pelvis showing posterior and right perirectal inflammatory stranding extending into the buttocks suspicious for cellulitis or phlegmon.  No soft tissue gas or abscess. Patient was given Tylenol, ceftriaxone, vancomycin, Zosyn, and 500 cc fluid bolus.  Patient states he had "pimples" on his buttocks which popped a few weeks ago and drained a lot of blood.  States his buttock/perirectal area always stays moist.  For the past 2 days he is having fevers and chills.  He is having pain in his right buttock and perirectal area which is making it difficult for him to sit.  Reports chronic dyspnea on exertion due to CHF.  Denies chest pain, abdominal pain, nausea, or vomiting.  Review of Systems:  All systems reviewed and apart from history of presenting illness, are negative.  Past Medical History:  Diagnosis Date   Allergic rhinitis    Anxiety    Atrial fibrillation (HCC)    Cancer (HCC)    hx of  skin cancers    Chronic kidney disease    Coronary artery disease    a. s/p MI & CABG; b. s/p PCI Diag;  c. Cath 12/2011: LAD diff dzs/small, Diag 75% isr, 90 dist to stent (small), LCX & RCA occluded, VG->OM 40, VG->PDA patent - med rx.   COVID-19    x 3   Depression    PTSD   Diabetes mellitus    not on medications   Diastolic CHF, chronic ([place])    a. EF 55-60% by echo 2007   GERD (gastroesophageal reflux disease)    "not anymore" " I had a lap band"   History of diverticulitis of colon    5 YRS AGO   History of pneumonia    2017   History of transient ischemic attack (TIA)    CAROTID DOPPLERS NOV 2011  0-39& BIL. STENOSIS   Hyperlipidemia    Hypertension    Kidney failure    Mitral regurgitation    Myocardial infarction [facility])    Neuromuscular disorder (HCC)    left arm numbness    OA (osteoarthritis)    RIGHT KNEE ARTHOFIBROSIS W/ PAIN  (S/P  REPLACEMENT 2004)   PTSD (post-traumatic stress disorder)    from [place] since 1973   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/25/2016   Complex valvuloplasty including artificial Gore-tex neochord placement x4, plication of anterior commissure, and 26 mm Sorin Memo 3D ring annuloplasty via right mini thoracotomy approach   Shortness of breath dyspnea    with exertion  Skin cancer    Sleep apnea    cpap- see ov note in EPIC 05/12/11 for settings    Stroke [facility])    hx of   Stroke [facility])     Past Surgical History:  Procedure Laterality Date   AV FISTULA PLACEMENT Left 08/02/2018   Procedure: ARTERIOVENOUS (AV) FISTULA CREATION RADIOCEPHALIC;  Surgeon: Angelia Mould, MD;  Location: [place];  Service: Vascular;  Laterality: Left;   [place]  2001, 2004, 2009, 2011   CARDIAC CATHETERIZATION N/A 01/23/2016   Procedure: Right/Left Heart Cath and Coronary Angiography;  Surgeon: [name], MD;  Location: [place] CV LAB;  Service: Cardiovascular;  Laterality: N/A;    CARDIOVERSION N/A 09/07/2017   Procedure: CARDIOVERSION;  Surgeon: [name], MD;  Location: [facility] ENDOSCOPY;  Service: Cardiovascular;  Laterality: N/A;   CARDIOVERSION N/A 09/13/2019   Procedure: CARDIOVERSION;  Surgeon: [name], MD;  Location: [facility] ENDOSCOPY;  Service: Cardiovascular;  Laterality: N/A;   CARDIOVERSION N/A 10/19/2019   Procedure: CARDIOVERSION;  Surgeon: [name], MD;  Location: [facility] ENDOSCOPY;  Service: Cardiovascular;  Laterality: N/A;   CATARACT EXTRACTION W/ INTRAOCULAR LENS IMPLANT Bilateral    CHONDROPLASTY  07/22/2011   Procedure: CHONDROPLASTY;  Surgeon: Dione Plover Aluisio;  Location: [place];  Service: Orthopedics;;   CIRCUMCISION  [place]-- POST CABG   W/ STENT, last cath 01/07/2012    CORONARY ARTERY BYPASS GRAFT  1995   X3 VESSEL   CORONARY ARTERY BYPASS GRAFT  1995   CORONARY STENT PLACEMENT     CYSTOSCOPY/URETEROSCOPY/HOLMIUM LASER/STENT PLACEMENT Right 01/08/2021   Procedure: CYSTOSCOPY/RETROGRADE/URETEROSCOPY/HOLMIUM LASER/STENT PLACEMENT;  Surgeon: Franchot Gallo, MD;  Location: [facility] ORS;  Service: Urology;  Laterality: Right;   IR PERC TUN PERIT CATH WO PORT S&I /IMAG  03/06/2021   IR REMOVAL TUN CV CATH W/O FL  04/14/2021   KNEE ARTHROSCOPY  07/22/2011   Procedure: ARTHROSCOPY KNEE;  Surgeon: Dione Plover Aluisio;  Location: [place];  Service: Orthopedics;  Laterality: Right;  WITH DEBRIDEMENT    KNEE ARTHROSCOPY W/ MENISCECTOMY  X2 IN 2002-- RIGHT KNEE   LAPAROSCOPIC GASTRIC BANDING  01-15-09   LEFT HEART CATH AND CORONARY ANGIOGRAPHY N/A 04/29/2017   Procedure: LEFT HEART CATH AND CORONARY ANGIOGRAPHY;  Surgeon: [name], MD;  Location: [place] CV LAB;  Service: Cardiovascular;  Laterality: N/A;   LEFT HEART CATHETERIZATION WITH CORONARY ANGIOGRAM N/A 09/11/2013   Procedure: LEFT HEART CATHETERIZATION WITH CORONARY ANGIOGRAM;  Surgeon: [name], MD;  Location: [facility] CATH LAB;  Service: Cardiovascular;  Laterality: N/A;   MITRAL VALVE REPAIR Right 03/25/2016   Procedure: MINIMALLY INVASIVE REOPERATION FOR MITRAL VALVE REPAIR  (MVR) with size 26 Sorin Memo 3D;  Surgeon: [name], MD;  Location: [place];  Service: Open Heart Surgery;  Laterality: Right;   MITRAL VALVE REPAIR  03/2016   RIGHT KNEE ED COMPARTMENT REPLACEMENT  2004   SYNOVECTOMY  07/22/2011   Procedure: SYNOVECTOMY;  Surgeon: Dione Plover Aluisio;  Location: [place];  Service: Orthopedics;;   TEE WITHOUT CARDIOVERSION N/A 01/23/2016   Procedure: TRANSESOPHAGEAL ECHOCARDIOGRAM (TEE);  Surgeon: [name], MD;  Location: [place];  Service: Cardiovascular;  Laterality: N/A;   TEE WITHOUT CARDIOVERSION N/A 03/25/2016   Procedure: TRANSESOPHAGEAL ECHOCARDIOGRAM (TEE);  Surgeon: [name], MD;  Location: [facility]  OR;  Service: Open Heart Surgery;  Laterality: N/A;   TEE WITHOUT CARDIOVERSION N/A 03/04/2021   Procedure: TRANSESOPHAGEAL ECHOCARDIOGRAM (TEE);  Surgeon: Lelon Perla, MD;  Location: [facility] ENDOSCOPY;  Service: Cardiovascular;  Laterality: N/A;   UMBILICAL HERNIA REPAIR  01-15-09   W/ GASTRIC BANDING PROCEDURE     reports that he has never smoked. He has never used smokeless tobacco. He reports that he does not currently use alcohol after a past usage of about 2.0 standard drinks per week. He reports that he does not use drugs.  Allergies  Allergen Reactions   Crestor [Rosuvastatin] Other (See Comments)    Muscle aches    Lipitor [Atorvastatin] Other (See Comments)    Muscle aches   Pravastatin Other (See Comments)    Muscle aches   Carvedilol     Other reaction(s): loss of appetite   Metformin Diarrhea   Serotonin Reuptake Inhibitors (Ssris)     Other reaction(s): Muscle aches   Zocor [Simvastatin]     Other reaction(s): Muscle pain   Codeine Itching and Other (See Comments)    Extremity tingling-- can take synthetic     Family History  Problem Relation Age of Onset   Other Mother        joint problems   Alzheimer's disease Mother    Hypertension Father    Heart disease Father    CVA Father        age 43   Depression Father    Heart disease Sister        CABG   Stroke Sister    Heart failure Sister        congestive   Diabetes Sister    Heart disease Brother    Hypertension Brother    Congestive Heart Failure Brother     Prior to Admission medications   Medication Sig Start Date End Date Taking? Authorizing Provider  acetaminophen (TYLENOL) 325 MG tablet Take 2 tablets (650 mg total) by mouth every 6 (six) hours as needed for mild pain (or Fever >/= 101). 03/07/21   [name], MD  Alirocumab (PRALUENT) 75 MG/ML SOAJ Inject 75 mg into the skin every 14 (fourteen) days.    [provider]  allopurinol (ZYLOPRIM) 100 MG tablet Take 0.5 tablets (50 mg total) by mouth 2 (two) times a week. Please discuss dosing with your PCP based on your renal function. 01/13/21 03/11/22  [name], MD  amiodarone (PACERONE) 200 MG tablet Take 0.5 tablets (100 mg total) by mouth daily. 04/08/21   [name], [name], FNP  apixaban (ELIQUIS) 5 MG TABS tablet Take 1 tablet (5 mg total) by mouth 2 (two) times daily. 09/09/17   [name], MD  Ascorbic Acid (VITAMIN C WITH ROSE HIPS) 1000 MG tablet Take 1,000 mg by mouth daily.    [provider]  calcitRIOL (ROCALTROL) 0.25 MCG capsule Take 0.25 mcg by mouth every other day.    [provider]  Carboxymethylcellulose Sodium 1 % GEL APPLY 1 DROP TO EACH EYE AT BEDTIME 12/06/20   [provider]  cetirizine (ZYRTEC) 10 MG tablet Take 10 mg by mouth daily.    [provider]  colchicine 0.6 MG tablet Take 0.6-1.2 mg by mouth 2 (two) times daily as needed (gout).  04/14/19   [provider]  fluticasone (FLONASE) 50 MCG/ACT nasal spray Place 1 spray into both nostrils daily as needed for allergies  or rhinitis.    [provider]  furosemide (LASIX) 80 MG tablet Take 80 mg by mouth daily.    [provider]  HYDROcodone-acetaminophen (NORCO/VICODIN) 5-325 MG tablet Take 1-2 tablets by mouth every 4 (four) hours as needed for severe pain. 03/07/21   [name], MD  nitroGLYCERIN (NITROSTAT) 0.4 MG SL tablet 1 TAB UNDER THE TONGUE EVERY 5 MINUTES AS NEEDED FOR CHEST PAIN UP TO 3 DOSES, IF PERSIST CALL 911 03/05/20   [name], MD  [facility] VERIO test strip 1 each daily. 02/23/21   [provider]  pantoprazole (PROTONIX) 40 MG tablet Take 40 mg by mouth daily as needed (heartburn).    [provider]  potassium chloride SA (KLOR-CON) 20 MEQ tablet Take 20 mEq by mouth daily.    [provider]  Semaglutide,0.25 or 0.5MG /DOS, (OZEMPIC, 0.25 OR 0.5 MG/DOSE,) 2 MG/1.5ML SOPN Inject 0.375 mLs (0.5 mg total) into the skin every Thursday. 02/01/20   Philemon Kingdom, MD  tadalafil (CIALIS) 20 MG tablet TAKE 1 TABLET BY MOUTH ONCE DAILY AS NEEDED 01/08/20   McKenzie, Candee Furbish, MD  traZODone (DESYREL) 100 MG tablet Take 100 mg by mouth at bedtime.    [provider]    Physical Exam: Vitals:   08/28/21 2200 08/28/21 2248 08/29/21 0116 08/29/21 0517  BP: 101/67 (!) 111/59 (!) 99/59 (!) 100/56  Pulse: (!) 57 81 81 75  Resp: 17 18 17 16   Temp:  98.7 F (37.1 C) 99 F (37.2 C) 99.2 F (37.3 C)  TempSrc:   Oral Oral  SpO2: 96% 100% 100% 98%  Weight:      Height:        Physical Exam Constitutional:      General: He is not in acute distress. HENT:     Head: Normocephalic and atraumatic.  Eyes:     Extraocular Movements: Extraocular movements intact.     Conjunctiva/sclera: Conjunctivae normal.  Cardiovascular:     Rate and Rhythm: Normal rate and regular rhythm.     Pulses: Normal pulses.  Pulmonary:     Effort: Pulmonary effort is normal. No respiratory distress.     Breath sounds: Normal breath sounds. No wheezing or  rales.  Abdominal:     General: Bowel sounds are normal. There is no distension.     Palpations: Abdomen is soft.     Tenderness: There is no abdominal tenderness.  Genitourinary:    Comments: Chaperone (Psychologist, counselling) present at bedside Induration and erythema of the perirectal region Musculoskeletal:        General: No swelling or tenderness.     Cervical back: Normal range of motion and neck supple.  Skin:    General: Skin is warm and dry.  Neurological:     General: No focal deficit present.     Mental Status: He is alert and oriented to person, place, and time.     Labs on Admission: I have personally reviewed following labs and imaging studies  CBC: Recent Labs  Lab 08/28/21 1852  WBC 15.2*  NEUTROABS 13.7*  HGB 11.5*  HCT 33.6*  MCV 96.8  PLT 725*   Basic Metabolic Panel: Recent Labs  Lab 08/28/21 1852  NA 133*  K 3.9  CL 100  CO2 20*  GLUCOSE 158*  BUN 43*  CREATININE 3.85*  CALCIUM 10.1   GFR: Estimated Creatinine Clearance: 16.6 mL/min (A) (by C-G formula based on SCr of 3.85 mg/dL (H)). Liver Function Tests: Recent Labs  Lab 08/28/21 1852  AST 20  ALT 15  ALKPHOS 78  BILITOT 1.2  PROT 6.7  ALBUMIN 3.9   No results for input(s): LIPASE, AMYLASE in the last 168 hours. No results for input(s): AMMONIA in the last 168 hours. Coagulation Profile: No results for input(s): INR, PROTIME in the last 168 hours. Cardiac Enzymes: No results for input(s): CKTOTAL, CKMB, CKMBINDEX, TROPONINI in the last 168 hours. BNP (last 3 results) No results for input(s): PROBNP in the last 8760 hours. HbA1C: No results for input(s): HGBA1C in the last 72 hours. CBG: No results for input(s): GLUCAP in the last 168 hours. Lipid Profile: No results for input(s): CHOL, HDL, LDLCALC, TRIG, CHOLHDL, LDLDIRECT in the last 72 hours. Thyroid Function Tests: No results for input(s): TSH, T4TOTAL, FREET4, T3FREE, THYROIDAB in the last 72 hours. Anemia Panel: No  results for input(s): VITAMINB12, FOLATE, FERRITIN, TIBC, IRON, RETICCTPCT in the last 72 hours. Urine analysis:    Component Value Date/Time   COLORURINE YELLOW 08/28/2021 1852   APPEARANCEUR CLEAR 08/28/2021 1852   LABSPEC 1.015 08/28/2021 1852   PHURINE 5.5 08/28/2021 1852   GLUCOSEU NEGATIVE 08/28/2021 1852   HGBUR TRACE (A) 08/28/2021 1852   BILIRUBINUR NEGATIVE 08/28/2021 1852   KETONESUR NEGATIVE 08/28/2021 1852   PROTEINUR NEGATIVE 08/28/2021 1852   UROBILINOGEN 0.2 04/04/2015 1529   NITRITE NEGATIVE 08/28/2021 1852   LEUKOCYTESUR NEGATIVE 08/28/2021 1852    Radiological Exams on Admission: CT PELVIS WO CONTRAST  Result Date: 08/28/2021 CLINICAL DATA:  Anal/rectal abscess. EXAM: CT PELVIS WITHOUT CONTRAST TECHNIQUE: Multidetector CT imaging of the pelvis was performed following the standard protocol without intravenous contrast. COMPARISON:  CT abdomen and pelvis 01/07/2021. FINDINGS: Urinary Tract:  No abnormality visualized. Bowel: No dilated bowel loops are seen. There is sigmoid colon diverticulosis without evidence for acute diverticulitis. The visualized portion of the appendix is within normal limits. Vascular/Lymphatic: There are atherosclerotic calcifications of the aorta. The visualized aorta is normal in size. No enlarged lymph nodes are identified. Reproductive:  Prostate gland is mildly enlarged. Other: There is no ascites or free air. There are small fat containing bilateral inguinal hernias. There is ill-defined inflammatory stranding surrounding the posterior/right rectum extending into the subcutaneous tissues of the right buttocks. No soft tissue gas identified. No obvious fluid collections allowing for lack of intravenous contrast. No evidence for foreign body. Musculoskeletal: No suspicious bone lesions identified. Degenerative changes are seen at L4-L5. IMPRESSION: 1. Posterior and right perirectal inflammatory stranding extending into the buttocks. Findings may  represent cellulitis or phlegmon. No evidence for soft tissue gas or obvious drainable fluid collection. 2. Colonic diverticulosis. 3.  Aortic Atherosclerosis (ICD10-I70.0). Electronically Signed   By: Ronney Asters M.D.   On: 08/28/2021 20:26    Assessment/Plan Principal Problem:   Cellulitis of right buttock Active Problems:   Type II diabetes mellitus (HCC)   CHF (congestive heart failure) (HCC)   Atrial fibrillation, chronic ([place])   Sepsis ([place])  Sepsis secondary to cellulitis of right buttock/perirectal region Febrile on arrival to the ED and labs showing leukocytosis.  Blood pressure soft but no lactic acidosis. CT pelvis showing posterior and right perirectal inflammatory stranding extending into the buttocks suspicious for cellulitis or phlegmon.  No soft tissue gas or abscess.  His diabetes seems to be well controlled with A1c <7 on previous labs. -Continue vancomycin and Zosyn.  IV fluid hydration.  Monitor WBC count.  Blood cultures pending.  CKD stage IV Mild metabolic acidosis Bicarb 20.  Creatinine 3.8, stable compared to recent labs. -Continue  to monitor BMP  Non-insulin-dependent type 2 diabetes Well-controlled-A1c 6.4 on 06/19/2021 and less than 7 on previous labs as well. -Sliding scale insulin sensitive ACHS.  Hypertension -Hold antihypertensives at this time.  OSA -Continue nightly CPAP  A. fib: Stable. CAD: Not endorsing chest pain. Depression, anxiety Chronic diastolic CHF: No signs of volume overload. Hyperlipidemia -Pharmacy med rec pending.  DVT prophylaxis: Resume Eliquis after pharmacy med rec is done. Code Status: Patient wishes to be full code. Family Communication: No family available at this time. Disposition Plan: Status is: Inpatient  Remains inpatient appropriate because: Sepsis secondary to cellulitis of right buttock/perirectal region  Level of care: Level of care: Med-Surg  The medical decision making on this patient was of high  complexity and the patient is at high risk for clinical deterioration, therefore this is a level 3 visit.  Shela Leff MD [facility]  If 7PM-7AM, please contact night-coverage [email or web address]  08/29/2021, 7:30 AM

## 2021-08-29 NOTE — Consult Note (Signed)
° °  [facility] Inpatient Consult   08/29/2021  [name] [birth year] [id number]  Coverage for [employer] [ACO] Patient: Medicare  Primary Care Provider:  [name], MD, [facility] Physician, [facility] is an Emb[facility] provider office with a Chronic Care   Patient is currently active with [facility] for chronic disease management services.and engaged by a [place].  Our community based plan of care has focused on disease management and community resource support.    Plan: Continue to follow for ongoing needs. Can refer to Embedded for CCM program if appropriate. Will follow up with [facility] RN for post hospital CM needs.   Of note, [facility] Care Management services does not replace or interfere with any services that are needed or arranged by inpatient [facility] care management team.  For additional questions or referrals please contact:  Natividad Brood, RN BSN [facility] Liaison  [phone] business mobile phone Toll free office [phone]  Fax number: [phone] [email or web address]

## 2021-08-29 NOTE — Progress Notes (Signed)
Pharmacy Antibiotic Note  [name] is a 76 y.o. male admitted on 08/28/2021 with cellulitis. Patient febrile at home to 102.2F. 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 Pharmacy has been consulted for vancomycin & Cefepime dosing.  Patient now afebrile (Tmax 101.2) WBC 15.2> 14.1 Scr today at 3.85, which appears to be at baseline for patient (~3.7). CrCl ~ 17 ml/min LA 1.3 WNL  Plan: Cefepime 2 gm IV q24 Vancomycin IV 1500 mg x 1 - ordered as 1 gm plus 500 mg at [facility]. 1 gm given 1/5 at 2141 and 500 mg NOT given as ordered at [facility] [place] [facility]> will give 500 mg now Start Vancomycin IV 1000 mg q48h (estimated AUC 444) SCr 3.85; Vd 0.72 Goal AUC 400-550 Monitor renal function and signs of clinical improvement Follow-up blood cultures Vancomycin levels as needed  Height: 5' 9.5" (176.5 cm) Weight: 86.2 kg (190 lb) IBW/kg (Calculated) : 71.85  Temp (24hrs), Avg:99.4 F (37.4 C), Min:98.7 F (37.1 C), Max:101.2 F (38.4 C)  Recent Labs  Lab 08/28/21 1852 08/29/21 0721  WBC 15.2* 14.1*  CREATININE 3.85*  --   LATICACIDVEN 1.3  --      Estimated Creatinine Clearance: 16.6 mL/min (A) (by C-G formula based on SCr of 3.85 mg/dL (H)).    Allergies  Allergen Reactions   Crestor [Rosuvastatin] Other (See Comments)    Muscle aches    Lipitor [Atorvastatin] Other (See Comments)    Muscle aches   Pravastatin Other (See Comments)    Muscle aches   Carvedilol     Other reaction(s): loss of appetite   Metformin Diarrhea   Serotonin Reuptake Inhibitors (Ssris)     Other reaction(s): Muscle aches   Zocor [Simvastatin]     Other reaction(s): Muscle pain   Codeine Itching and Other (See Comments)    Extremity tingling-- can take synthetic    Antimicrobials this admission: 1/5 vancomycin >>  1/5 Zosyn x 1 1/5 Ceftriaxone x 1 1/6 cefepime >> Dose adjustments this admission: none  Microbiology results: 1/5 BCx2: sent  Thank you for involving pharmacy in this  patient's care.  Eudelia Bunch, Pharm.D 08/29/2021 7:54 AM

## 2021-08-29 NOTE — Plan of Care (Signed)
Plan of care initiated and discussed with the patient. 

## 2021-08-30 DIAGNOSIS — L03317 Cellulitis of buttock: Secondary | ICD-10-CM | POA: Diagnosis not present

## 2021-08-30 LAB — GLUCOSE, CAPILLARY
Glucose-Capillary: 92 mg/dL (ref 70–99)
Glucose-Capillary: 81 mg/dL (ref 70–99)
Glucose-Capillary: 165 mg/dL — ABNORMAL HIGH (ref 70–99)
Glucose-Capillary: 155 mg/dL — ABNORMAL HIGH (ref 70–99)
Glucose-Capillary: 107 mg/dL — ABNORMAL HIGH (ref 70–99)

## 2021-08-30 LAB — BASIC METABOLIC PANEL
Anion gap: 7 (ref 5–15)
BUN: 41 mg/dL — ABNORMAL HIGH (ref 8–23)
CO2: 23 mmol/L (ref 22–32)
Calcium: 9.5 mg/dL (ref 8.9–10.3)
Chloride: 104 mmol/L (ref 98–111)
Creatinine, Ser: 3.59 mg/dL — ABNORMAL HIGH (ref 0.61–1.24)
GFR, Estimated: 17 mL/min — ABNORMAL LOW (ref >60)
Glucose, Bld: 96 mg/dL (ref 70–99)
Potassium: 3.4 mmol/L — ABNORMAL LOW (ref 3.5–5.1)
Sodium: 134 mmol/L — ABNORMAL LOW (ref 135–145)

## 2021-08-30 LAB — CBC WITH DIFFERENTIAL/PLATELET
Abs Immature Granulocytes: 0.05 10*3/uL (ref 0.00–0.07)
Basophils Absolute: 0 10*3/uL (ref 0.0–0.1)
Basophils Relative: 0 %
Eosinophils Absolute: 0.6 10*3/uL — ABNORMAL HIGH (ref 0.0–0.5)
Eosinophils Relative: 6 %
HCT: 25.5 % — ABNORMAL LOW (ref 39.0–52.0)
Hemoglobin: 8.5 g/dL — ABNORMAL LOW (ref 13.0–17.0)
Immature Granulocytes: 1 %
Lymphocytes Relative: 8 %
Lymphs Abs: 0.8 10*3/uL (ref 0.7–4.0)
MCH: 32.9 pg (ref 26.0–34.0)
MCHC: 33.3 g/dL (ref 30.0–36.0)
MCV: 98.8 fL (ref 80.0–100.0)
Monocytes Absolute: 0.7 10*3/uL (ref 0.1–1.0)
Monocytes Relative: 8 %
Neutro Abs: 7.1 10*3/uL (ref 1.7–7.7)
Neutrophils Relative %: 77 %
Platelets: 137 10*3/uL — ABNORMAL LOW (ref 150–400)
RBC: 2.58 MIL/uL — ABNORMAL LOW (ref 4.22–5.81)
RDW: 13 % (ref 11.5–15.5)
WBC: 9.3 10*3/uL (ref 4.0–10.5)
nRBC: 0 % (ref 0.0–0.2)

## 2021-08-30 MED ORDER — APIXABAN 5 MG PO TABS
5.0000 mg | ORAL_TABLET | Freq: Two times a day (BID) | ORAL | Status: DC
  Administered 2021-08-30 – 2021-08-31 (×3): 5 mg via ORAL
  Filled 2021-08-30 (×3): qty 1

## 2021-08-30 MED ORDER — SENNOSIDES-DOCUSATE SODIUM 8.6-50 MG PO TABS
1.0000 | ORAL_TABLET | Freq: Every day | ORAL | Status: DC
  Administered 2021-08-30 – 2021-09-03 (×5): 1 via ORAL
  Filled 2021-08-30 (×5): qty 1

## 2021-08-30 MED ORDER — POLYETHYLENE GLYCOL 3350 17 G PO PACK
17.0000 g | PACK | Freq: Every day | ORAL | Status: DC
  Administered 2021-08-30 – 2021-09-03 (×4): 17 g via ORAL
  Filled 2021-08-30 (×5): qty 1

## 2021-08-30 NOTE — Plan of Care (Signed)

## 2021-08-30 NOTE — Progress Notes (Signed)
PROGRESS NOTE   [name]  NKN:[id number] DOB: February 12, 1946 DOA: 08/28/2021 PCP: [name], MD  Brief Narrative:   76 year old home dwelling white male Chronic A. 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  Reported 3 separate episodes of small abscesses in his buttock region perirectal-found to have temperature one 1.2 systolic blood pressure 90, WBC 15 creatinine 3.8 which is stable On admission CT scan showed perirectal inflammation with stranding suspicious for cellulitis Started on Vanco and Zosyn   Hospital-Problem based course  Perirectal cellulitis Seen by general surgery 1/6 deemed to be nonsurgical candidate given no fluctuance Continue Vanco and cefepime --still has swelling pain and discomfort and the swelling is slightly worse than prior Pain control Percocet 1-2 every 4 as needed moderate DC IV fluids 100 cc/H Chronic A. fib CHADS2 score >4 CAD CABG 1995 MV repair 03/2016 HFpEF Stable at this time continue amiodarone 100 daily --resume eliquis 5 twice daily  Lasix 40 daily on hold resume on discharge  Resume Praluent 75 mg q. 14 days on discharge Controlled DM TY 2 Resume Ozempic on discharge CKD 4 at baseline Stable at this time--consider lower dose of Lasix on discharge Prior stroke 01/2021 Peroneal nerve related right-sided foot drop Deficits minimal--outpatient follow-up    DVT prophylaxis: Resumed Eliquis Code Status: Full Family Communication:  Disposition:  Status is: Inpatient  Remains inpatient appropriate because: Illness       Consultants:  General surgery  Procedures:  None  Antimicrobials:  Vanc and cefepime    Subjective:  Having similar amounts of pain feels that the swelling in rectal area is larger No chest pain no fever Abdomen is soft and I examined the left rectal area and the swelling is still present He has no chest pain EOMI NCAT no focal deficit Chest clear no rales rhonchi   Objective: Vitals:   08/29/21 1503 08/29/21 2114 08/30/21 0429 08/30/21 1313  BP: 97/62 114/63 103/62 110/62  Pulse: 72 73 72 72  Resp:  18 17 18   Temp: 98.3 F (36.8 C) 98.4 F (36.9 C) 98.9 F (37.2 C) 97.7 F (36.5 C)  TempSrc: Oral Oral    SpO2: 100% 100% 98% 98%  Weight:      Height:        Intake/Output Summary (Last 24 hours) at 08/30/2021 1343 Last data filed at 08/30/2021 1314 Gross per 24 hour  Intake 3301.65 ml  Output --  Net 3301.65 ml   Filed Weights   08/28/21 1659  Weight: 86.2 kg    Examination:  Abdomen soft no rebound no guarding No lower extremity edema Rectal exam increasing swelling of left perirectal/ischiorectal area-no fluctuance noted however Neuro grossly intact  Data Reviewed: personally reviewed   CBC    Component Value Date/Time   WBC 9.3 08/30/2021 0319   RBC 2.58 (L) 08/30/2021 0319   HGB 8.5 (L) 08/30/2021 0319   HGB 14.0 09/03/2017 0933   HCT 25.5 (L) 08/30/2021 0319   HCT 42.5 09/03/2017 0933   PLT 137 (L) 08/30/2021 0319   PLT 200 09/03/2017 0933   MCV 98.8 08/30/2021 0319   MCV 86 09/03/2017 0933   MCH 32.9 08/30/2021 0319  MCHC 33.3 08/30/2021 0319   RDW 13.0 08/30/2021 0319   RDW 19.2 (H) 09/03/2017 0933   LYMPHSABS 0.8 08/30/2021 0319   LYMPHSABS 1.1 09/03/2017 0933   MONOABS 0.7 08/30/2021 0319   EOSABS 0.6 (H) 08/30/2021 0319   EOSABS 0.4 09/03/2017 0933   BASOSABS 0.0 08/30/2021 0319   BASOSABS 0.0 09/03/2017 0933   CMP Latest Ref Rng & Units 08/30/2021 08/29/2021 08/28/2021  Glucose 70 - 99 mg/dL 96 120(H) 158(H)  BUN 8 - 23 mg/dL 41(H) 44(H) 43(H)  Creatinine 0.61 - 1.24 mg/dL  3.59(H) 3.97(H) 3.85(H)  Sodium 135 - 145 mmol/L 134(L) 131(L) 133(L)  Potassium 3.5 - 5.1 mmol/L 3.4(L) 3.8 3.9  Chloride 98 - 111 mmol/L 104 102 100  CO2 22 - 32 mmol/L 23 22 20(L)  Calcium 8.9 - 10.3 mg/dL 9.5 9.4 10.1  Total Protein 6.5 - 8.1 g/dL - - 6.7  Total Bilirubin 0.3 - 1.2 mg/dL - - 1.2  Alkaline Phos 38 - 126 U/L - - 78  AST 15 - 41 U/L - - 20  ALT 0 - 44 U/L - - 15     Radiology Studies: CT PELVIS WO CONTRAST  Result Date: 08/28/2021 CLINICAL DATA:  Anal/rectal abscess. EXAM: CT PELVIS WITHOUT CONTRAST TECHNIQUE: Multidetector CT imaging of the pelvis was performed following the standard protocol without intravenous contrast. COMPARISON:  CT abdomen and pelvis 01/07/2021. FINDINGS: Urinary Tract:  No abnormality visualized. Bowel: No dilated bowel loops are seen. There is sigmoid colon diverticulosis without evidence for acute diverticulitis. The visualized portion of the appendix is within normal limits. Vascular/Lymphatic: There are atherosclerotic calcifications of the aorta. The visualized aorta is normal in size. No enlarged lymph nodes are identified. Reproductive:  Prostate gland is mildly enlarged. Other: There is no ascites or free air. There are small fat containing bilateral inguinal hernias. There is ill-defined inflammatory stranding surrounding the posterior/right rectum extending into the subcutaneous tissues of the right buttocks. No soft tissue gas identified. No obvious fluid collections allowing for lack of intravenous contrast. No evidence for foreign body. Musculoskeletal: No suspicious bone lesions identified. Degenerative changes are seen at L4-L5. IMPRESSION: 1. Posterior and right perirectal inflammatory stranding extending into the buttocks. Findings may represent cellulitis or phlegmon. No evidence for soft tissue gas or obvious drainable fluid collection. 2. Colonic diverticulosis. 3.  Aortic Atherosclerosis (ICD10-I70.0). Electronically Signed   By: Ronney Asters M.D.   On: 08/28/2021 20:26     Scheduled Meds:  amiodarone  100 mg Oral Daily   insulin aspart  0-5 Units Subcutaneous QHS   insulin aspart  0-9 Units Subcutaneous TID WC   nystatin   Topical TID   simethicone  80 mg Oral QID   traZODone  100 mg Oral QHS   Continuous Infusions:  ceFEPime (MAXIPIME) IV 2 g (08/30/21 0850)   lactated ringers 100 mL/hr at 08/29/21 2113   [START ON 08/31/2021] vancomycin       LOS: 2 days   Time spent: 22  [name], MD [facility] To contact the attending provider between 7A-7P or the covering provider during after hours 7P-7A, please log into the web site [email or web address] and access using universal [place] password for that web site. If you do not have the password, please call the hospital operator.  08/30/2021, 1:43 PM

## 2021-08-30 NOTE — Plan of Care (Signed)
°  Problem: Education: Goal: Knowledge of General Education information will improve Description: Including pain rating scale, medication(s)/side effects and non-pharmacologic comfort measures Outcome: Progressing   Problem: Health Behavior/Discharge Planning: Goal: Ability to manage health-related needs will improve Outcome: Progressing   Problem: Clinical Measurements: Goal: Ability to maintain clinical measurements within normal limits will improve Outcome: Progressing   Problem: Activity: Goal: Risk for activity intolerance will decrease Outcome: Progressing   Problem: Nutrition: Goal: Adequate nutrition will be maintained Outcome: Progressing   Problem: Coping: Goal: Level of anxiety will decrease Outcome: Progressing   Problem: Elimination: Goal: Will not experience complications related to urinary retention Outcome: Progressing

## 2021-08-31 ENCOUNTER — Inpatient Hospital Stay (HOSPITAL_COMMUNITY): Payer: No Typology Code available for payment source

## 2021-08-31 DIAGNOSIS — L03317 Cellulitis of buttock: Secondary | ICD-10-CM | POA: Diagnosis not present

## 2021-08-31 LAB — CBC WITH DIFFERENTIAL/PLATELET
Abs Immature Granulocytes: 0.05 10*3/uL (ref 0.00–0.07)
Basophils Absolute: 0 10*3/uL (ref 0.0–0.1)
Basophils Relative: 1 %
Eosinophils Absolute: 0.5 10*3/uL (ref 0.0–0.5)
Eosinophils Relative: 6 %
HCT: 26.2 % — ABNORMAL LOW (ref 39.0–52.0)
Hemoglobin: 8.8 g/dL — ABNORMAL LOW (ref 13.0–17.0)
Immature Granulocytes: 1 %
Lymphocytes Relative: 11 %
Lymphs Abs: 0.8 10*3/uL (ref 0.7–4.0)
MCH: 33.3 pg (ref 26.0–34.0)
MCHC: 33.6 g/dL (ref 30.0–36.0)
MCV: 99.2 fL (ref 80.0–100.0)
Monocytes Absolute: 0.7 10*3/uL (ref 0.1–1.0)
Monocytes Relative: 9 %
Neutro Abs: 5.6 10*3/uL (ref 1.7–7.7)
Neutrophils Relative %: 72 %
Platelets: 150 10*3/uL (ref 150–400)
RBC: 2.64 MIL/uL — ABNORMAL LOW (ref 4.22–5.81)
RDW: 13 % (ref 11.5–15.5)
WBC: 7.6 10*3/uL (ref 4.0–10.5)
nRBC: 0 % (ref 0.0–0.2)

## 2021-08-31 LAB — BASIC METABOLIC PANEL WITH GFR
Anion gap: 7 (ref 5–15)
BUN: 39 mg/dL — ABNORMAL HIGH (ref 8–23)
CO2: 24 mmol/L (ref 22–32)
Calcium: 9.8 mg/dL (ref 8.9–10.3)
Chloride: 104 mmol/L (ref 98–111)
Creatinine, Ser: 3.4 mg/dL — ABNORMAL HIGH (ref 0.61–1.24)
GFR, Estimated: 18 mL/min — ABNORMAL LOW
Glucose, Bld: 101 mg/dL — ABNORMAL HIGH (ref 70–99)
Potassium: 3.5 mmol/L (ref 3.5–5.1)
Sodium: 135 mmol/L (ref 135–145)

## 2021-08-31 LAB — GLUCOSE, CAPILLARY
Glucose-Capillary: 91 mg/dL (ref 70–99)
Glucose-Capillary: 96 mg/dL (ref 70–99)
Glucose-Capillary: 154 mg/dL — ABNORMAL HIGH (ref 70–99)
Glucose-Capillary: 153 mg/dL — ABNORMAL HIGH (ref 70–99)
Glucose-Capillary: 116 mg/dL — ABNORMAL HIGH (ref 70–99)

## 2021-08-31 MED ORDER — IOHEXOL 9 MG/ML PO SOLN
500.0000 mL | ORAL | Status: AC
  Administered 2021-08-31 (×2): 500 mL via ORAL

## 2021-08-31 MED ORDER — IOHEXOL 9 MG/ML PO SOLN
ORAL | Status: AC
  Filled 2021-08-31: qty 1000

## 2021-08-31 NOTE — Progress Notes (Signed)
PROGRESS NOTE   [name]  LGX:[id number] DOB: 20-Jul-1946 DOA: 08/28/2021 PCP: [name], MD  Brief Narrative:   76 year old home dwelling white male Chronic A. 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  Reported 3 separate episodes of small abscesses in his buttock region perirectal-found to have temperature one 1.2 systolic blood pressure 90, WBC 15 creatinine 3.8 which is stable On admission CT scan showed perirectal inflammation with stranding suspicious for cellulitis Started on Vanco and Zosyn   Hospital-Problem based course  Perirectal cellulitis Seen by general surgery 1/6 deemed to be nonsurgical candidate given no fluctuance Continue Vanco and cefepime --swelling seems to have increased in perirectal area although pain is decreased Repeat CT scan pelvis without contrast today Pain control is moderate on Percocet 1-2 every 4 as needed moderate DC IV fluids 100 cc/H Chronic A. fib CHADS2 score >4 CAD CABG 1995 MV repair 03/2016 HFpEF Stable at this time continue amiodarone 100 daily --resume eliquis 5 twice daily  Lasix 40 daily on hold resume on discharge  Resume Praluent 75 mg q. 14 days on discharge Dilutional anemia without any bleeding Only monitor at this time Controlled DM TY 2 Resume Ozempic on discharge Sugars are well controlled in the 90s to 100 range on labs every morning CKD 4 at baseline Stable at this time--consider lower dose of Lasix on discharge Prior stroke 01/2021 Peroneal nerve related right-sided foot drop Deficits minimal--outpatient follow-up  DVT prophylaxis: Resumed  Eliquis Code Status: Full Family Communication:  Disposition:  Status is: Inpatient  Remains inpatient appropriate because: Illness       Consultants:  General surgery  Procedures: None  Antimicrobials:  Vanc and cefepime    Subjective:  Awake alert coherent States pain is moderate Ambulating Swelling is diffuse and there is fullness in the buttocks more so than yesterday  Objective: Vitals:   08/30/21 0429 08/30/21 1313 08/30/21 2108 08/31/21 0458  BP: 103/62 110/62 119/68 104/65  Pulse: 72 72 79 62  Resp: 17 18 18 17   Temp: 98.9 F (37.2 C) 97.7 F (36.5 C) 98.6 F (37 C) 98 F (36.7 C)  TempSrc:   Oral Oral  SpO2: 98% 98% 97% 98%  Weight:      Height:        Intake/Output Summary (Last 24 hours) at 08/31/2021 0944 Last data filed at 08/30/2021 1800 Gross per 24 hour  Intake 2259.76 ml  Output --  Net 2259.76 ml    Filed Weights   08/28/21 1659  Weight: 86.2 kg    Examination:  Awake coherent no distress Chest clear no added sound Abdomen soft with lap band port present Neurologically intact moving all 4 limbs equally Rectal exam shows increasing right-sided rectal fullness in the ischiorectal/perianal area No lower extremity edema  Data Reviewed: personally reviewed   CBC    Component Value Date/Time   WBC 7.6 08/31/2021 0311   RBC 2.64 (L) 08/31/2021 0311   HGB 8.8 (L) 08/31/2021 0311   HGB 14.0 09/03/2017 0933   HCT 26.2 (L) 08/31/2021 0311   HCT 42.5 09/03/2017 0933   PLT 150 08/31/2021 0311   PLT 200 09/03/2017 0933   MCV 99.2 08/31/2021  0311   MCV 86 09/03/2017 0933   MCH 33.3 08/31/2021 0311   MCHC 33.6 08/31/2021 0311   RDW 13.0 08/31/2021 0311   RDW 19.2 (H) 09/03/2017 0933   LYMPHSABS 0.8 08/31/2021 0311   LYMPHSABS 1.1 09/03/2017 0933   MONOABS 0.7 08/31/2021 0311   EOSABS 0.5 08/31/2021 0311   EOSABS 0.4 09/03/2017 0933   BASOSABS 0.0 08/31/2021 0311   BASOSABS 0.0 09/03/2017 0933   CMP Latest Ref Rng & Units  08/31/2021 08/30/2021 08/29/2021  Glucose 70 - 99 mg/dL 101(H) 96 120(H)  BUN 8 - 23 mg/dL 39(H) 41(H) 44(H)  Creatinine 0.61 - 1.24 mg/dL 3.40(H) 3.59(H) 3.97(H)  Sodium 135 - 145 mmol/L 135 134(L) 131(L)  Potassium 3.5 - 5.1 mmol/L 3.5 3.4(L) 3.8  Chloride 98 - 111 mmol/L 104 104 102  CO2 22 - 32 mmol/L 24 23 22   Calcium 8.9 - 10.3 mg/dL 9.8 9.5 9.4  Total Protein 6.5 - 8.1 g/dL - - -  Total Bilirubin 0.3 - 1.2 mg/dL - - -  Alkaline Phos 38 - 126 U/L - - -  AST 15 - 41 U/L - - -  ALT 0 - 44 U/L - - -     Radiology Studies: No results found.   Scheduled Meds:  amiodarone  100 mg Oral Daily   apixaban  5 mg Oral BID   insulin aspart  0-5 Units Subcutaneous QHS   insulin aspart  0-9 Units Subcutaneous TID WC   nystatin   Topical TID   polyethylene glycol  17 g Oral Daily   senna-docusate  1 tablet Oral Daily   simethicone  80 mg Oral QID   traZODone  100 mg Oral QHS   Continuous Infusions:  ceFEPime (MAXIPIME) IV 2 g (08/31/21 0816)   vancomycin       LOS: 3 days   Time spent: [place], MD [facility] To contact the attending provider between 7A-7P or the covering provider during after hours 7P-7A, please log into the web site [email or web address] and access using universal [place] password for that web site. If you do not have the password, please call the hospital operator.  08/31/2021, 9:44 AM

## 2021-08-31 NOTE — Progress Notes (Signed)
Patient ID: [name], male   DOB: September 10, 1945, 76 y.o.   MRN: [id number]  We were called back to consult on this patient.  WBC remains normal, but there are concerns on physical examination.  Non-contrast CT shows slight enlargement of the right gluteal cellulitis.  Unable to exclude underlying fluid collection/ abscess.  Patient has not been NPO.  He also received two doses of Eliquis.  Hold Eliquis NPO p MN.  Will reevaluate 09/01/21 to determine whether this needs to be debrided surgically.    Continue abx.  Imogene Burn. Georgette Dover, MD, [facility]  General Surgery   08/31/2021 5:13 PM

## 2021-08-31 NOTE — Plan of Care (Signed)
  Problem: Education: Goal: Knowledge of General Education information will improve Description: Including pain rating scale, medication(s)/side effects and non-pharmacologic comfort measures Outcome: Progressing   Problem: Activity: Goal: Risk for activity intolerance will decrease Outcome: Progressing   Problem: Elimination: Goal: Will not experience complications related to urinary retention Outcome: Progressing   Problem: Pain Managment: Goal: General experience of comfort will improve Outcome: Progressing   

## 2021-08-31 NOTE — Plan of Care (Signed)
  Problem: Education: Goal: Knowledge of General Education information will improve Description: Including pain rating scale, medication(s)/side effects and non-pharmacologic comfort measures Outcome: Progressing   Problem: Activity: Goal: Risk for activity intolerance will decrease Outcome: Progressing   Problem: Pain Managment: Goal: General experience of comfort will improve Outcome: Progressing   

## 2021-09-01 ENCOUNTER — Encounter (HOSPITAL_COMMUNITY)
Admission: EM | Disposition: A | Discharge status: Home Health | Payer: Self-pay | Source: Home / Self Care | Attending: Family Medicine

## 2021-09-01 ENCOUNTER — Encounter (HOSPITAL_COMMUNITY): Payer: Self-pay | Admitting: Internal Medicine

## 2021-09-01 ENCOUNTER — Inpatient Hospital Stay (HOSPITAL_COMMUNITY): Payer: No Typology Code available for payment source | Admitting: Anesthesiology

## 2021-09-01 DIAGNOSIS — L03317 Cellulitis of buttock: Secondary | ICD-10-CM | POA: Diagnosis not present

## 2021-09-01 HISTORY — PX: INCISION AND DRAINAGE PERIRECTAL ABSCESS: SHX1804

## 2021-09-01 LAB — CBC WITH DIFFERENTIAL/PLATELET
Abs Immature Granulocytes: 0.05 10*3/uL (ref 0.00–0.07)
Basophils Absolute: 0.1 10*3/uL (ref 0.0–0.1)
Basophils Relative: 1 %
Eosinophils Absolute: 0.4 10*3/uL (ref 0.0–0.5)
Eosinophils Relative: 6 %
HCT: 27.8 % — ABNORMAL LOW (ref 39.0–52.0)
Hemoglobin: 9.4 g/dL — ABNORMAL LOW (ref 13.0–17.0)
Immature Granulocytes: 1 %
Lymphocytes Relative: 14 %
Lymphs Abs: 0.9 10*3/uL (ref 0.7–4.0)
MCH: 33.1 pg (ref 26.0–34.0)
MCHC: 33.8 g/dL (ref 30.0–36.0)
MCV: 97.9 fL (ref 80.0–100.0)
Monocytes Absolute: 0.6 10*3/uL (ref 0.1–1.0)
Monocytes Relative: 10 %
Neutro Abs: 4.2 10*3/uL (ref 1.7–7.7)
Neutrophils Relative %: 68 %
Platelets: 154 10*3/uL (ref 150–400)
RBC: 2.84 MIL/uL — ABNORMAL LOW (ref 4.22–5.81)
RDW: 12.8 % (ref 11.5–15.5)
WBC: 6.2 10*3/uL (ref 4.0–10.5)
nRBC: 0 % (ref 0.0–0.2)

## 2021-09-01 LAB — GLUCOSE, CAPILLARY
Glucose-Capillary: 104 mg/dL — ABNORMAL HIGH (ref 70–99)
Glucose-Capillary: 99 mg/dL (ref 70–99)
Glucose-Capillary: 95 mg/dL (ref 70–99)
Glucose-Capillary: 97 mg/dL (ref 70–99)
Glucose-Capillary: 88 mg/dL (ref 70–99)
Glucose-Capillary: 98 mg/dL (ref 70–99)
Glucose-Capillary: 346 mg/dL — ABNORMAL HIGH (ref 70–99)

## 2021-09-01 LAB — BASIC METABOLIC PANEL
Anion gap: 8 (ref 5–15)
BUN: 30 mg/dL — ABNORMAL HIGH (ref 8–23)
CO2: 24 mmol/L (ref 22–32)
Calcium: 10 mg/dL (ref 8.9–10.3)
Chloride: 104 mmol/L (ref 98–111)
Creatinine, Ser: 3.19 mg/dL — ABNORMAL HIGH (ref 0.61–1.24)
GFR, Estimated: 19 mL/min — ABNORMAL LOW (ref >60)
Glucose, Bld: 101 mg/dL — ABNORMAL HIGH (ref 70–99)
Potassium: 3.8 mmol/L (ref 3.5–5.1)
Sodium: 136 mmol/L (ref 135–145)

## 2021-09-01 SURGERY — INCISION AND DRAINAGE, ABSCESS, PERIRECTAL
Anesthesia: General | Site: Perineum

## 2021-09-01 MED ORDER — DEXAMETHASONE SODIUM PHOSPHATE 10 MG/ML IJ SOLN
INTRAMUSCULAR | Status: DC | PRN
  Administered 2021-09-01: 10 mg via INTRAVENOUS

## 2021-09-01 MED ORDER — FENTANYL CITRATE PF 50 MCG/ML IJ SOSY: 25.0000 ug | PREFILLED_SYRINGE | INTRAMUSCULAR | Status: DC | PRN

## 2021-09-01 MED ORDER — 0.9 % SODIUM CHLORIDE (POUR BTL) OPTIME
TOPICAL | Status: DC | PRN
  Administered 2021-09-01: 1000 mL

## 2021-09-01 MED ORDER — LACTATED RINGERS IV SOLN: INTRAVENOUS | Status: DC

## 2021-09-01 MED ORDER — PROPOFOL 10 MG/ML IV BOLUS
INTRAVENOUS | Status: AC
  Filled 2021-09-01: qty 20

## 2021-09-01 MED ORDER — LIDOCAINE 2% (20 MG/ML) 5 ML SYRINGE
INTRAMUSCULAR | Status: DC | PRN
  Administered 2021-09-01: 100 mg via INTRAVENOUS

## 2021-09-01 MED ORDER — BUPIVACAINE-EPINEPHRINE 0.5% -1:200000 IJ SOLN
INTRAMUSCULAR | Status: DC | PRN
  Administered 2021-09-01: 10 mL

## 2021-09-01 MED ORDER — FENTANYL CITRATE (PF) 100 MCG/2ML IJ SOLN
INTRAMUSCULAR | Status: DC | PRN
  Administered 2021-09-01 (×4): 50 ug via INTRAVENOUS

## 2021-09-01 MED ORDER — OXYCODONE HCL 5 MG PO TABS: 5.0000 mg | ORAL_TABLET | Freq: Once | ORAL | Status: DC | PRN

## 2021-09-01 MED ORDER — FENTANYL CITRATE (PF) 250 MCG/5ML IJ SOLN
INTRAMUSCULAR | Status: AC
  Filled 2021-09-01: qty 5

## 2021-09-01 MED ORDER — LIDOCAINE HCL (PF) 2 % IJ SOLN
INTRAMUSCULAR | Status: AC
  Filled 2021-09-01: qty 5

## 2021-09-01 MED ORDER — PROPOFOL 10 MG/ML IV BOLUS
INTRAVENOUS | Status: DC | PRN
Start: 2021-09-01 — End: 2021-09-01
  Administered 2021-09-01: 160 mg via INTRAVENOUS

## 2021-09-01 MED ORDER — DIPHENHYDRAMINE HCL 25 MG PO CAPS
25.0000 mg | ORAL_CAPSULE | Freq: Four times a day (QID) | ORAL | Status: DC | PRN
  Administered 2021-09-01 – 2021-09-02 (×5): 25 mg via ORAL
  Filled 2021-09-01 (×5): qty 1

## 2021-09-01 MED ORDER — ONDANSETRON HCL 4 MG/2ML IJ SOLN
INTRAMUSCULAR | Status: DC | PRN
Start: 2021-09-01 — End: 2021-09-01
  Administered 2021-09-01: 4 mg via INTRAVENOUS

## 2021-09-01 MED ORDER — DEXAMETHASONE SODIUM PHOSPHATE 10 MG/ML IJ SOLN
INTRAMUSCULAR | Status: AC
  Filled 2021-09-01: qty 1

## 2021-09-01 MED ORDER — ACETAMINOPHEN 500 MG PO TABS
1000.0000 mg | ORAL_TABLET | Freq: Once | ORAL | Status: AC
  Administered 2021-09-01: 1000 mg via ORAL
  Filled 2021-09-01: qty 2

## 2021-09-01 MED ORDER — ONDANSETRON HCL 4 MG/2ML IJ SOLN
INTRAMUSCULAR | Status: AC
  Filled 2021-09-01: qty 2

## 2021-09-01 MED ORDER — CHLORHEXIDINE GLUCONATE 0.12 % MT SOLN
15.0000 mL | OROMUCOSAL | Status: AC
  Administered 2021-09-01: 15 mL via OROMUCOSAL

## 2021-09-01 MED ORDER — ROCURONIUM BROMIDE 10 MG/ML (PF) SYRINGE
PREFILLED_SYRINGE | INTRAVENOUS | Status: AC
  Filled 2021-09-01: qty 10

## 2021-09-01 MED ORDER — OXYCODONE HCL 5 MG/5ML PO SOLN: 5.0000 mg | Freq: Once | ORAL | Status: DC | PRN

## 2021-09-01 MED ORDER — BUPIVACAINE-EPINEPHRINE (PF) 0.25% -1:200000 IJ SOLN
INTRAMUSCULAR | Status: AC
  Filled 2021-09-01: qty 30

## 2021-09-01 SURGICAL SUPPLY — 20 items
BAG COUNTER SPONGE SURGICOUNT (BAG) IMPLANT
BAG SPNG CNTER NS LX DISP (BAG)
BLADE SURG 15 STRL LF DISP TIS (BLADE) ×1 IMPLANT
BLADE SURG 15 STRL SS (BLADE) ×2
DRAIN PENROSE 0.25X18 (DRAIN) ×1 IMPLANT
DRSG PAD ABDOMINAL 8X10 ST (GAUZE/BANDAGES/DRESSINGS) ×1 IMPLANT
GAUZE 4X4 16PLY ~~LOC~~+RFID DBL (SPONGE) ×3 IMPLANT
GAUZE SPONGE 4X4 12PLY STRL (GAUZE/BANDAGES/DRESSINGS) ×1 IMPLANT
GLOVE SURG ENC MOIS LTX SZ6.5 (GLOVE) ×2 IMPLANT
GLOVE SURG UNDER LTX SZ6.5 (GLOVE) ×2 IMPLANT
GLOVE SURG UNDER POLY LF SZ7 (GLOVE) ×2 IMPLANT
GOWN STRL REUS W/TWL XL LVL3 (GOWN DISPOSABLE) ×4 IMPLANT
PACK LITHOTOMY IV (CUSTOM PROCEDURE TRAY) ×2 IMPLANT
PENCIL SMOKE EVACUATOR (MISCELLANEOUS) IMPLANT
SURGILUBE 2OZ TUBE FLIPTOP (MISCELLANEOUS) ×2 IMPLANT
SUT SILK 2 0 (SUTURE)
SUT SILK 2 0 SH (SUTURE) ×1 IMPLANT
SUT SILK 2-0 18XBRD TIE 12 (SUTURE) IMPLANT
TOWEL OR 17X26 10 PK STRL BLUE (TOWEL DISPOSABLE) ×2 IMPLANT
TOWEL OR NON WOVEN STRL DISP B (DISPOSABLE) ×2 IMPLANT

## 2021-09-01 NOTE — Op Note (Signed)
Preoperative diagnosis: perirectal abscess  Postoperative diagnosis: same   Procedure: incision and drainage of perirectal abscess  Surgeon: Gurney Maxin, M.D.  Asst: none  Anesthesia: general anesthesia  Indications for procedure: [name] is a 76 y.o. year old male with symptoms of pain and swelling in the perianal region was found to have a developing abscess that did not respond to antibiotics. He presents for incision and drainage.  Description of procedure: The patient was brought into the operative suite. Anesthesia was administered with General LMA anesthesia. WHO checklist was applied. The patient was then placed in lithotomy position. The area was prepped and draped in the usual sterile fashion.  Next, marcaine was infiltrated around the area. A small incision was made at the area of greatest induration. Purulence drained out, cultures swabs were taken. A counterincision was made and 1/4" penrose looped through. The penrose was sutured to itself with 2-0 silk to complete a circle. The abscess was irrigated with saline. Hemostasis was applied with cautery. The larger wound was packed to help ensure hemostasis given blood thinners.  Findings: perirectal abscess  Specimen: abscess for culture  Implant: 1/4" penrose   Blood loss: 20 ml  Local anesthesia:  10 ml marcaine   Complications: none  Gurney Maxin, M.D. General, Bariatric, & Minimally Invasive Surgery [facility]

## 2021-09-01 NOTE — Progress Notes (Signed)
Pharmacy Antibiotic Note  [name] is a 76 y.o. male admitted on 08/28/2021 with cellulitis. Patient febrile at home to 102.90F. 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 Pharmacy has been consulted for vancomycin & Cefepime dosing.  Patient to OR on 1/9 for I and D  WBC now WNL Scr today at 3.19, trending down  LA 1.3 WNL  Plan: Continue Cefepime 2 gm IV q24 Continue Vancomycin IV 1000 mg q48h (estimated AUC 444) SCr 3.85; Vd 0.72 Goal AUC 400-550 Monitor renal function and signs of clinical improvement Follow-up blood cultures Vancomycin levels as needed  Height: 5' 9.5" (176.5 cm) Weight: 86.2 kg (190 lb) IBW/kg (Calculated) : 71.85  Temp (24hrs), Avg:98.5 F (36.9 C), Min:97.6 F (36.4 C), Max:99.6 F (37.6 C)  Recent Labs  Lab 08/28/21 1852 08/29/21 0721 08/30/21 0319 08/31/21 0311 09/01/21 0746  WBC 15.2* 14.1* 9.3 7.6 6.2  CREATININE 3.85* 3.97* 3.59* 3.40* 3.19*  LATICACIDVEN 1.3  --   --   --   --      Estimated Creatinine Clearance: 20 mL/min (A) (by C-G formula based on SCr of 3.19 mg/dL (H)).    Allergies  Allergen Reactions   Crestor [Rosuvastatin] Other (See Comments)    Muscle aches    Lipitor [Atorvastatin] Other (See Comments)    Muscle aches   Pravastatin Other (See Comments)    Muscle aches   Carvedilol     Other reaction(s): loss of appetite   Metformin Diarrhea   Serotonin Reuptake Inhibitors (Ssris)     Other reaction(s): Muscle aches   Zocor [Simvastatin]     Other reaction(s): Muscle pain   Codeine Itching and Other (See Comments)    Extremity tingling-- can take synthetic    Antimicrobials this admission: 1/5 vancomycin >>  1/5 Zosyn x 1 1/5 Ceftriaxone x 1 1/6 cefepime >> Dose adjustments this admission: none  Microbiology results: 1/5 BCx2: NGTD 1/9 wound culture:   Thank you for involving pharmacy in this patient's care.  Royetta Asal, PharmD, BCPS 09/01/2021 3:38 PM

## 2021-09-01 NOTE — Plan of Care (Signed)
°  Problem: Education: Goal: Knowledge of General Education information will improve Description: Including pain rating scale, medication(s)/side effects and non-pharmacologic comfort measures Outcome: Progressing   Problem: Activity: Goal: Risk for activity intolerance will decrease Outcome: Progressing   Problem: Clinical Measurements: Goal: Ability to maintain clinical measurements within normal limits will improve Outcome: [place], RN 09/01/21 12:37 PM

## 2021-09-01 NOTE — Progress Notes (Signed)
Progress Note     Subjective: Pt reports continued pain and swelling of R buttock. He reports he had something similar to this a few weeks ago on both sides but spontaneous drainage. Denies any drainage from current area. Discussed possible surgery for drainage this afternoon and patient is agreeable to proceed. Reports his wife is a retired Geophysicist/field seismologist.   Objective: Vital signs in last 24 hours: Temp:  [97.4 F (36.3 C)-99.6 F (37.6 C)] 98 F (36.7 C) (01/09 0517) Pulse Rate:  [68-82] 82 (01/09 0517) Resp:  [17-18] 17 (01/09 0517) BP: (128-132)/(72-77) 129/74 (01/09 0517) SpO2:  [98 %-99 %] 99 % (01/09 0517) Last BM Date: 08/28/21  Intake/Output from previous day: 01/08 0701 - 01/09 0700 In: 872.8 [P.O.:600; IV Piggyback:272.8] Out: -  Intake/Output this shift: No intake/output data recorded.  PE: General: pleasant, WD, elderly male who is laying in bed in NAD HEENT: head is normocephalic, atraumatic.  Sclera are noninjected.  Heart: regular, rate, and rhythm.  Normal s1,s2. No obvious murmurs, gallops, or rubs noted.   Lungs: CTAB, no wheezes, rhonchi, or rales noted.  Respiratory effort nonlabored Abd: soft, NT, ND GU: erythema and induration of R buttock, no clear drainage, no obvious fluctuance externally  MS: all 4 extremities are symmetrical with no cyanosis, clubbing, or edema. Skin: warm and dry with no masses, lesions, or rashes Neuro: Cranial nerves 2-12 grossly intact, sensation is normal throughout Psych: A&Ox3 with an appropriate affect.    Lab Results:  Recent Labs    08/31/21 0311 09/01/21 0746  WBC 7.6 6.2  HGB 8.8* 9.4*  HCT 26.2* 27.8*  PLT 150 154   BMET Recent Labs    08/31/21 0311 09/01/21 0746  NA 135 136  K 3.5 3.8  CL 104 104  CO2 24 24  GLUCOSE 101* 101*  BUN 39* 30*  CREATININE 3.40* 3.19*  CALCIUM 9.8 10.0   PT/INR No results for input(s): LABPROT, INR in the last 72 hours. CMP     Component Value Date/Time   NA 136  09/01/2021 0746   NA 140 09/03/2017 0933   K 3.8 09/01/2021 0746   CL 104 09/01/2021 0746   CO2 24 09/01/2021 0746   GLUCOSE 101 (H) 09/01/2021 0746   BUN 30 (H) 09/01/2021 0746   BUN 24 09/03/2017 0933   CREATININE 3.19 (H) 09/01/2021 0746   CREATININE 1.66 (H) 06/05/2016 1413   CALCIUM 10.0 09/01/2021 0746   PROT 6.7 08/28/2021 1852   ALBUMIN 3.9 08/28/2021 1852   AST 20 08/28/2021 1852   ALT 15 08/28/2021 1852   ALKPHOS 78 08/28/2021 1852   BILITOT 1.2 08/28/2021 1852   GFRNONAA 19 (L) 09/01/2021 0746   GFRAA 18 (L) 03/07/2020 1046   Lipase  No results found for: LIPASE     Studies/Results: CT PELVIS WO CONTRAST  Result Date: 08/31/2021 CLINICAL DATA:  Evaluate for anal or rectal abscess. EXAM: CT PELVIS WITHOUT CONTRAST TECHNIQUE: Multidetector CT imaging of the pelvis was performed following the standard protocol without intravenous contrast. COMPARISON:  01/26/2022 FINDINGS: Urinary Tract:  No abnormality visualized. Bowel: Extensive sigmoid diverticulosis identified. Small volume of fluid is identified within the left lower quadrant adjacent to sigmoid colon, image 59/5. Not seen on previous exam. Vascular/Lymphatic: There is extensive aorto scratch set aortic atherosclerotic disease noted. No abdominopelvic adenopathy. Reproductive:  Prostate gland appears normal. Other: There is progressive, asymmetric increased soft tissue infiltration within the right gluteal fold. This area measures approximately 5.8 x 2.7  cm, image 27/5. Formally 4.8 by 2.0 cm. Central low attenuation compatible with underlying phlegmon. Underlying abscess cannot be excluded as assessment for focal fluid collection is limited due to lack of IV contrast material. Musculoskeletal: No suspicious bone lesions identified. IMPRESSION: 1. Progressive, asymmetric increased soft tissue inflammation/infiltration within the right gluteal fold is identified compatible with cellulitis and phlegmon formation. Underlying  abscess cannot be excluded as assessment for focal fluid collection is limited due to lack of IV contrast material. If confirmation of fluid collection is clinically indicated, and the patient is unable to receive IV contrast material, then pelvic MRI without contrast may provide a more sensitive and specific assessment of this area. 2. Extensive sigmoid diverticulosis. 3. Small volume of fluid identified within the left lower quadrant adjacent to sigmoid colon. Not seen on previous exam. 4. Aortic Atherosclerosis (ICD10-I70.0). Electronically Signed   By: Kerby Moors M.D.   On: 08/31/2021 13:09    Anti-infectives: Anti-infectives (From admission, onward)    Start     Dose/Rate Route Frequency Ordered Stop   08/31/21 1200  vancomycin (VANCOCIN) IVPB 1000 mg/200 mL premix        1,000 mg 200 mL/hr over 60 Minutes Intravenous Every 48 hours 08/28/21 2205     08/29/21 1000  ceFEPIme (MAXIPIME) 2 g in sodium chloride 0.9 % 100 mL IVPB        2 g 200 mL/hr over 30 Minutes Intravenous Every 24 hours 08/29/21 0745     08/29/21 0900  vancomycin (VANCOREADY) IVPB 500 mg/100 mL        500 mg 100 mL/hr over 60 Minutes Intravenous  Once 08/29/21 0752 08/29/21 0921   08/28/21 2230  vancomycin (VANCOCIN) 500 mg in sodium chloride 0.9 % 500 mL IVPB  Status:  Discontinued       See Hyperspace for full Linked Orders Report.   500 mg 500 mL/hr over 60 Minutes Intravenous  Once 08/28/21 2126 08/29/21 0752   08/28/21 2130  vancomycin (VANCOCIN) IVPB 1000 mg/200 mL premix       See Hyperspace for full Linked Orders Report.   1,000 mg 200 mL/hr over 60 Minutes Intravenous  Once 08/28/21 2126 08/28/21 2241   08/28/21 2100  piperacillin-tazobactam (ZOSYN) IVPB 3.375 g        3.375 g 100 mL/hr over 30 Minutes Intravenous  Once 08/28/21 2054 08/28/21 2211   08/28/21 1915  cefTRIAXone (ROCEPHIN) 2 g in sodium chloride 0.9 % 100 mL IVPB  Status:  Discontinued        2 g 200 mL/hr over 30 Minutes Intravenous Every  24 hours 08/28/21 1911 08/28/21 2112        Assessment/Plan Right buttock cellulitis with probable perirectal abscess - seen initially 1/6 and no fluctuance noted at that time - persistent pain and cellulitis over the weekend  - CT 1/8: Progressive, asymmetric increased soft tissue inflammation/infiltration within the right gluteal fold is identified compatible with cellulitis and phlegmon formation. Underlying abscess cannot be excluded - no leukocytosis and afebrile but tender and swollen on exam - will discuss with MD but preemptively plan for I&D this afternoon - eliquis was held after AM dose yesterday   FEN: NPO, LR ordered at 75 cc/h VTE: eliquis on hold ID: [email or web address]   - below per TRH -  Chronic A. 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  This care required moderate level of medical decision making.    Norm Parcel, [facility] Surgery 09/01/2021, 8:41 AM Please see Amion for pager number during day hours 7:00am-4:30pm

## 2021-09-01 NOTE — Anesthesia Preprocedure Evaluation (Addendum)
Anesthesia Evaluation  Patient identified by MRN, date of birth, ID band Patient awake    Reviewed: Allergy & Precautions, NPO status , Patient's Chart, lab work & pertinent test results  History of Anesthesia Complications Negative for: history of anesthetic complications  Airway Mallampati: II  TM Distance: >3 FB Neck ROM: Full    Dental no notable dental hx. (+) Missing,    Pulmonary sleep apnea ,    Pulmonary exam normal        Cardiovascular hypertension, + CAD, + Past MI, + CABG and +CHF  Normal cardiovascular exam+ dysrhythmias Atrial Fibrillation   TTE 03/2021: EF 55-60%, moderate asymmetric LVH of the basal-septal segment, severe LAE, filamentous mobile density on the left atrial side of the mitral annulus, likely an annuloplasty suture. S/p MVR, mild MR, mild to moderate MS, mild dilatation of the ascending aorta measuring 39 mm, large PFO with predominantly left to right shunting across the atrial septum   Neuro/Psych Anxiety Depression CVA (left sided weakness), Residual Symptoms    GI/Hepatic Neg liver ROS, PUD, GERD  Medicated and Controlled,perirectal abcess   Endo/Other  diabetes, Type 2  Renal/GU Renal InsufficiencyRenal disease  negative genitourinary   Musculoskeletal  (+) Arthritis ,   Abdominal   Peds  Hematology  (+) anemia , Hgb 9.4   Anesthesia Other Findings Day of surgery medications reviewed with patient.  Reproductive/Obstetrics negative OB ROS                            Anesthesia Physical Anesthesia Plan  ASA: 3  Anesthesia Plan: General   Post-op Pain Management: Tylenol PO (pre-op)   Induction: Intravenous  PONV Risk Score and Plan: 2 and Treatment may vary due to age or medical condition, Ondansetron and Dexamethasone  Airway Management Planned: LMA  Additional Equipment: None  Intra-op Plan:   Post-operative Plan: Extubation in OR  Informed  Consent: I have reviewed the patients History and Physical, chart, labs and discussed the procedure including the risks, benefits and alternatives for the proposed anesthesia with the patient or authorized representative who has indicated his/her understanding and acceptance.     Dental advisory given  Plan Discussed with: CRNA  Anesthesia Plan Comments:        Anesthesia Quick Evaluation

## 2021-09-01 NOTE — Progress Notes (Signed)
Patient and wife want medical team to be aware that he has heart failure, kidney failure, and has a fistula graft in left arm in case dialysis is needed. Patient sees Dr. Algernon Huxley for his heart failure. Ivan Anchors, RN 09/01/21 10:07 AM

## 2021-09-01 NOTE — Transfer of Care (Signed)
Immediate Anesthesia Transfer of Care Note  Patient: [name]  Procedure(s) Performed: IRRIGATION AND DEBRIDEMENT PERIRECTAL ABSCESS (Perineum)  Patient Location: PACU  Anesthesia Type:General  Level of Consciousness: awake, alert  and oriented  Airway & Oxygen Therapy: Patient Spontanous Breathing and Patient connected to face mask oxygen  Post-op Assessment: Report given to RN and Post -op Vital signs reviewed and stable  Post vital signs: Reviewed and stable  Last Vitals:  Vitals Value Taken Time  BP 123/69 09/01/21 1522  Temp    Pulse 76 09/01/21 1525  Resp 21 09/01/21 1525  SpO2 98 % 09/01/21 1525  Vitals shown include unvalidated device data.  Last Pain:  Vitals:   09/01/21 1317  TempSrc: Oral  PainSc:          Complications: No notable events documented.

## 2021-09-01 NOTE — Anesthesia Postprocedure Evaluation (Signed)
Anesthesia Post Note  Patient: [name]  Procedure(s) Performed: IRRIGATION AND DEBRIDEMENT PERIRECTAL ABSCESS (Perineum)     Patient location during evaluation: PACU Anesthesia Type: General Level of consciousness: awake and alert and oriented Pain management: pain level controlled Vital Signs Assessment: post-procedure vital signs reviewed and stable Respiratory status: spontaneous breathing, nonlabored ventilation and respiratory function stable Cardiovascular status: blood pressure returned to baseline Postop Assessment: no apparent nausea or vomiting Anesthetic complications: no   No notable events documented.  Last Vitals:  Vitals:   09/01/21 1530 09/01/21 1545  BP: 122/72 126/73  Pulse: 72 68  Resp: 13 19  Temp:  (!) 36.4 C  SpO2: 97% 95%    Last Pain:  Vitals:   09/01/21 1545  TempSrc:   PainSc: 0-No pain                 Marthenia Rolling

## 2021-09-01 NOTE — Progress Notes (Signed)
PROGRESS NOTE   [name]  EPP:[id number] DOB: 1945-10-06 DOA: 08/28/2021 PCP: [name], MD  Brief Narrative:   76 year old home dwelling white male Chronic A. 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  Reported 3 separate episodes of small abscesses in his buttock region perirectal-found to have temperature one 1.2 systolic blood pressure 90, WBC 15 creatinine 3.8 which is stable On admission CT scan showed perirectal inflammation with stranding suspicious for cellulitis Started on Vanco and Zosyn  Patient evaluated 1/6 by general surgery felt to be nonsurgical but because of increasing swelling in the rectum-reevaluated on 1/9 and may be going for I&D  Hospital-Problem based course  Perirectal cellulitis Reassessed by general surgery 1/9 going for possible I&D based on CT findings 1/18 showing increasing size of abscess Continue Vanco and cefepime Pain control is moderate on Percocet 1-2 every 4 as needed moderate DC IV fluids 100 cc/H Chronic A. fib CHADS2 score >4 CAD CABG 1995 MV repair 03/2016 HFpEF Stable at this time continue amiodarone 100 daily --resume eliquis 5 twice daily  Lasix 40 daily on hold resume on discharge  Resume Praluent 75 mg q. 14 days on discharge Dilutional anemia without any bleeding Only monitor at this time Controlled DM TY 2 Resume Ozempic on discharge Sugars are well controlled in the 90s to 100 range-remained stable CKD 4 at baseline Stable at this time--consider lower dose of Lasix on discharge Prior stroke 01/2021 Peroneal nerve related right-sided foot drop Deficits  minimal--outpatient follow-up  DVT prophylaxis: Resumed Eliquis Code Status: Full Family Communication: No family currently present at this time-patient updated Disposition:  Status is: Inpatient  Remains inpatient appropriate because: Illness       Consultants:  General surgery  Procedures: None  Antimicrobials:  Vanc and cefepime    Subjective:  Patient is awake alert in no distress has been examined and seen by general surgery and plan is for I&D later today    Objective: Vitals:   08/31/21 0458 08/31/21 1432 08/31/21 2104 09/01/21 0517  BP: 104/65 128/72 132/77 129/74  Pulse: 62 73 68 82  Resp: 17 18 18 17   Temp: 98 F (36.7 C) (!) 97.4 F (36.3 C) 99.6 F (37.6 C) 98 F (36.7 C)  TempSrc: Oral Oral Oral Oral  SpO2: 98% 98% 99% 99%  Weight:      Height:        Intake/Output Summary (Last 24 hours) at 09/01/2021 1012 Last data filed at 08/31/2021 1800 Gross per 24 hour  Intake 532.75 ml  Output --  Net 532.75 ml    Filed Weights   08/28/21 1659  Weight: 86.2 kg    Examination:  Awake coherent EOMI NCAT no focal deficit CTAP Wound not examined today  Data Reviewed: personally reviewed   CBC    Component Value Date/Time   WBC 6.2 09/01/2021 0746   RBC 2.84 (L) 09/01/2021 0746   HGB 9.4 (L) 09/01/2021 0746   HGB 14.0 09/03/2017 0933   HCT 27.8 (L) 09/01/2021 0746   HCT 42.5 09/03/2017 0933   PLT 154 09/01/2021 0746   PLT 200 09/03/2017 0933   MCV 97.9 09/01/2021 0746   MCV 86 09/03/2017  0933   MCH 33.1 09/01/2021 0746   MCHC 33.8 09/01/2021 0746   RDW 12.8 09/01/2021 0746   RDW 19.2 (H) 09/03/2017 0933   LYMPHSABS 0.9 09/01/2021 0746   LYMPHSABS 1.1 09/03/2017 0933   MONOABS 0.6 09/01/2021 0746   EOSABS 0.4 09/01/2021 0746   EOSABS 0.4 09/03/2017 0933   BASOSABS 0.1 09/01/2021 0746   BASOSABS 0.0 09/03/2017 0933   CMP Latest Ref Rng & Units 09/01/2021 08/31/2021 08/30/2021  Glucose 70 - 99 mg/dL 101(H) 101(H) 96  BUN 8 - 23 mg/dL  30(H) 39(H) 41(H)  Creatinine 0.61 - 1.24 mg/dL 3.19(H) 3.40(H) 3.59(H)  Sodium 135 - 145 mmol/L 136 135 134(L)  Potassium 3.5 - 5.1 mmol/L 3.8 3.5 3.4(L)  Chloride 98 - 111 mmol/L 104 104 104  CO2 22 - 32 mmol/L 24 24 23   Calcium 8.9 - 10.3 mg/dL 10.0 9.8 9.5  Total Protein 6.5 - 8.1 g/dL - - -  Total Bilirubin 0.3 - 1.2 mg/dL - - -  Alkaline Phos 38 - 126 U/L - - -  AST 15 - 41 U/L - - -  ALT 0 - 44 U/L - - -     Radiology Studies: CT PELVIS WO CONTRAST  Result Date: 08/31/2021 CLINICAL DATA:  Evaluate for anal or rectal abscess. EXAM: CT PELVIS WITHOUT CONTRAST TECHNIQUE: Multidetector CT imaging of the pelvis was performed following the standard protocol without intravenous contrast. COMPARISON:  01/26/2022 FINDINGS: Urinary Tract:  No abnormality visualized. Bowel: Extensive sigmoid diverticulosis identified. Small volume of fluid is identified within the left lower quadrant adjacent to sigmoid colon, image 59/5. Not seen on previous exam. Vascular/Lymphatic: There is extensive aorto scratch set aortic atherosclerotic disease noted. No abdominopelvic adenopathy. Reproductive:  Prostate gland appears normal. Other: There is progressive, asymmetric increased soft tissue infiltration within the right gluteal fold. This area measures approximately 5.8 x 2.7 cm, image 27/5. Formally 4.8 by 2.0 cm. Central low attenuation compatible with underlying phlegmon. Underlying abscess cannot be excluded as assessment for focal fluid collection is limited due to lack of IV contrast material. Musculoskeletal: No suspicious bone lesions identified. IMPRESSION: 1. Progressive, asymmetric increased soft tissue inflammation/infiltration within the right gluteal fold is identified compatible with cellulitis and phlegmon formation. Underlying abscess cannot be excluded as assessment for focal fluid collection is limited due to lack of IV contrast material. If confirmation of fluid collection is clinically indicated,  and the patient is unable to receive IV contrast material, then pelvic MRI without contrast may provide a more sensitive and specific assessment of this area. 2. Extensive sigmoid diverticulosis. 3. Small volume of fluid identified within the left lower quadrant adjacent to sigmoid colon. Not seen on previous exam. 4. Aortic Atherosclerosis (ICD10-I70.0). Electronically Signed   By: Kerby Moors M.D.   On: 08/31/2021 13:09     Scheduled Meds:  amiodarone  100 mg Oral Daily   insulin aspart  0-5 Units Subcutaneous QHS   insulin aspart  0-9 Units Subcutaneous TID WC   nystatin   Topical TID   polyethylene glycol  17 g Oral Daily   senna-docusate  1 tablet Oral Daily   simethicone  80 mg Oral QID   traZODone  100 mg Oral QHS   Continuous Infusions:  ceFEPime (MAXIPIME) IV Stopped (08/31/21 0846)   lactated ringers     vancomycin 1,000 mg (08/31/21 1138)     LOS: 4 days   Time spent: Osg[place], MD [facility] To contact the attending provider  between 7A-7P or the covering provider during after hours 7P-7A, please log into the web site [email or web address] and access using universal [place] password for that web site. If you do not have the password, please call the hospital operator.  09/01/2021, 10:12 AM

## 2021-09-01 NOTE — Anesthesia Procedure Notes (Signed)
Procedure Name: LMA Insertion Date/Time: 09/01/2021 2:41 PM Performed by: Perry Brucato D, CRNA Pre-anesthesia Checklist: Patient identified, Emergency Drugs available, Suction available and Patient being monitored Patient Re-evaluated:Patient Re-evaluated prior to induction Oxygen Delivery Method: Circle system utilized Preoxygenation: Pre-oxygenation with 100% oxygen Induction Type: IV induction Ventilation: Mask ventilation without difficulty LMA: LMA inserted LMA Size: 4.0 Tube type: Oral Number of attempts: 1 Placement Confirmation: positive ETCO2 and breath sounds checked- equal and bilateral Tube secured with: Tape Dental Injury: Teeth and Oropharynx as per pre-operative assessment

## 2021-09-02 ENCOUNTER — Encounter (HOSPITAL_COMMUNITY): Payer: Self-pay | Admitting: General Surgery

## 2021-09-02 DIAGNOSIS — L03317 Cellulitis of buttock: Secondary | ICD-10-CM | POA: Diagnosis not present

## 2021-09-02 LAB — CULTURE, BLOOD (ROUTINE X 2)
Culture: NO GROWTH
Culture: NO GROWTH
Special Requests: ADEQUATE
Special Requests: ADEQUATE

## 2021-09-02 LAB — CBC WITH DIFFERENTIAL/PLATELET
Abs Immature Granulocytes: 0.07 10*3/uL (ref 0.00–0.07)
Basophils Absolute: 0 10*3/uL (ref 0.0–0.1)
Basophils Relative: 0 %
Eosinophils Absolute: 0 10*3/uL (ref 0.0–0.5)
Eosinophils Relative: 0 %
HCT: 28.1 % — ABNORMAL LOW (ref 39.0–52.0)
Hemoglobin: 9.4 g/dL — ABNORMAL LOW (ref 13.0–17.0)
Immature Granulocytes: 2 %
Lymphocytes Relative: 6 %
Lymphs Abs: 0.3 10*3/uL — ABNORMAL LOW (ref 0.7–4.0)
MCH: 32.6 pg (ref 26.0–34.0)
MCHC: 33.5 g/dL (ref 30.0–36.0)
MCV: 97.6 fL (ref 80.0–100.0)
Monocytes Absolute: 0.1 10*3/uL (ref 0.1–1.0)
Monocytes Relative: 3 %
Neutro Abs: 3.9 10*3/uL (ref 1.7–7.7)
Neutrophils Relative %: 89 %
Platelets: 175 10*3/uL (ref 150–400)
RBC: 2.88 MIL/uL — ABNORMAL LOW (ref 4.22–5.81)
RDW: 12.7 % (ref 11.5–15.5)
WBC: 4.3 10*3/uL (ref 4.0–10.5)
nRBC: 0 % (ref 0.0–0.2)

## 2021-09-02 LAB — COMPREHENSIVE METABOLIC PANEL
ALT: 14 U/L (ref 0–44)
AST: 17 U/L (ref 15–41)
Albumin: 3.1 g/dL — ABNORMAL LOW (ref 3.5–5.0)
Alkaline Phosphatase: 65 U/L (ref 38–126)
Anion gap: 10 (ref 5–15)
BUN: 35 mg/dL — ABNORMAL HIGH (ref 8–23)
CO2: 22 mmol/L (ref 22–32)
Calcium: 10.4 mg/dL — ABNORMAL HIGH (ref 8.9–10.3)
Chloride: 104 mmol/L (ref 98–111)
Creatinine, Ser: 3.21 mg/dL — ABNORMAL HIGH (ref 0.61–1.24)
GFR, Estimated: 19 mL/min — ABNORMAL LOW (ref >60)
Glucose, Bld: 257 mg/dL — ABNORMAL HIGH (ref 70–99)
Potassium: 4.3 mmol/L (ref 3.5–5.1)
Sodium: 136 mmol/L (ref 135–145)
Total Bilirubin: 0.7 mg/dL (ref 0.3–1.2)
Total Protein: 5.9 g/dL — ABNORMAL LOW (ref 6.5–8.1)

## 2021-09-02 LAB — GLUCOSE, CAPILLARY
Glucose-Capillary: 189 mg/dL — ABNORMAL HIGH (ref 70–99)
Glucose-Capillary: 177 mg/dL — ABNORMAL HIGH (ref 70–99)
Glucose-Capillary: 181 mg/dL — ABNORMAL HIGH (ref 70–99)
Glucose-Capillary: 174 mg/dL — ABNORMAL HIGH (ref 70–99)
Glucose-Capillary: 186 mg/dL — ABNORMAL HIGH (ref 70–99)

## 2021-09-02 MED ORDER — VANCOMYCIN HCL 1250 MG/250ML IV SOLN
1250.0000 mg | INTRAVENOUS | Status: DC
  Administered 2021-09-02: 1250 mg via INTRAVENOUS
  Filled 2021-09-02: qty 250

## 2021-09-02 MED ORDER — OXYCODONE-ACETAMINOPHEN 5-325 MG PO TABS: 1.0000 | ORAL_TABLET | ORAL | 0 refills | Status: DC | PRN

## 2021-09-02 MED ORDER — AMOXICILLIN-POT CLAVULANATE 875-125 MG PO TABS
1.0000 | ORAL_TABLET | Freq: Two times a day (BID) | ORAL | Status: DC
  Administered 2021-09-02 – 2021-09-03 (×3): 1 via ORAL
  Filled 2021-09-02 (×3): qty 1

## 2021-09-02 MED ORDER — AMOXICILLIN-POT CLAVULANATE 875-125 MG PO TABS: 1.0000 | ORAL_TABLET | Freq: Two times a day (BID) | ORAL | 0 refills | Status: DC

## 2021-09-02 NOTE — Progress Notes (Signed)
°  Patient seen and examined and appreciate general surgery continued attention to wound I am preparing his discharge for tomorrow we will discharge him with a total of 14 days antibiotics with Augmentin and pain control I will defer wound care and packing to general surgery and follow-up as per them  No charge  See separate dictated discharge summary should be stable for discharge 1/11  Verneita Griffes, MD [facility] 11:27 AM

## 2021-09-02 NOTE — TOC Transition Note (Signed)
Transition of Care [facility]) - CM/SW Discharge Note  Patient Details  Name: DAIVD FREDERICKSEN MRN: [id number] Date of Birth: 28-Jul-1946  Transition of Care [facility] CM/SW Contact:  Sherie Don, LCSW Phone Number: 09/02/2021, 2:44 PM  Clinical Narrative: TOC notified patient will need [facility] for wound care. CSW made [facility] referral to Amy with [facility] as the agency is in-network with the [place]. CSW updated wife. Orders are in. TOC signing off.  Final next level of care: [place] Barriers to Discharge: Barriers Resolved  Patient Goals and CMS Choice Patient states their goals for this hospitalization and ongoing recovery are:: Return home with family Choice offered to / list presented to : NA  Discharge Plan and Services In-house Referral: Clinical Social Work (Cane, rolling walker, grab bars) Post Acute Care Choice: NA          DME Arranged: N/A DME Agency: NA HH Arranged: RN [place] Agency: [place] Date HH Agency Contacted: 09/02/21 Representative spoke with at [place]: Amy  Readmission Risk Interventions Readmission Risk Prevention Plan 08/29/2021  Transportation Screening Complete  Man or Home Care Consult Complete  Social Work Consult for [place] Planning/Counseling Complete  Palliative Care Screening Not Applicable  Medication Review [employer] photographer) Complete  Some recent data might be hidden

## 2021-09-02 NOTE — Plan of Care (Signed)

## 2021-09-02 NOTE — Plan of Care (Signed)
  Problem: Activity: Goal: Risk for activity intolerance will decrease Outcome: Progressing   Problem: Pain Managment: Goal: General experience of comfort will improve Outcome: Progressing   Problem: Safety: Goal: Ability to remain free from injury will improve Outcome: Progressing   

## 2021-09-02 NOTE — Discharge Instructions (Addendum)
Home Instructions Following Incision and Drainage of Perirectal Abscess  Wound care - A dressing has been applied to control any bleeding or drainage immediately after your procedure.  You may remove this dressing at your first bowel movement or tomorrow morning, whichever comes first.  There may be packing inside your wound as well that should be removed with the dressing.  You do not need to repack the area.  After the dressing is removed, clean the area gently with a mild soap and warm water and place a piece of 100% cotton over the area.  Change to cotton ever 1-3 hours while awake to keep the area clean and dry.   - Beginning tomorrow, sit in a tub of warm water for 15-20 minutes at least twice a day and after bowel movements.  This will help with healing, pain and discomfort. - A small amount of bleeding is to be expected.  If you notice an increase in the bleeding, place a large piece of cotton (about the size of a golf ball) next to the anal opening and sit on a hard surface for 15 minutes.  If the bleeding persists or if you are concerned, please call the office.  Do not sit on rubber rings.  Instead, sit on a soft pillow.    You have a drain in place that will be removed at followed up.  Diet -Eat a regular diet.  Avoid foods that may constipate you or give you diarrhea.  Drink 6-8 glasses of water a day and avoid seeds, nuts and popcorn until the area heals.  Medication -Take pain medication as directed.  Do not drive or operate machinery if you are taking a prescription pain medication.   - We recommend Extra Strength Tylenol for mild to moderate pain.  This can be taken as instructed on the bottle.   - If you are given a prescription for antibiotics, take as instructed by your doctor until the entire course is completed  Bowel Habits Avoid laxatives unless instructed by your doctor. Take a fiber supplement twice a day (Metamucil, FiberCon, Benefiber) Avoid excessive straining to  have a bowel movement Do not go for more than 3 days without a bowel movement.  Take a regular Fleet enema if you are constipated.  Call the office if unable to do this or no results.    Activity Resume activities as tolerated beginning tomorrow.  Avoid strenuous activities or sports for one week.    Call the office if you have any questions.  Call IMMEDIATELY if you should develop persistent heavy rectal bleeding, increase in pain, difficulty urinating or fever greater than 100 F.

## 2021-09-02 NOTE — Progress Notes (Signed)
Pharmacy Antibiotic Note  [name] is a 76 y.o. male admitted on 08/28/2021 with cellulitis. Patient febrile at home to 102.44F. 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 Pharmacy has been consulted for vancomycin & Cefepime dosing.  Patient to OR on 1/9 for I&D of worsening perianal abscess.   WBC now WNL and decreased.   Scr remains decreased at 3.2   Plan: Continue Cefepime 2 gm IV q24 Increase to Vancomycin IV 1250 mg q48h (estimated AUC 480 using SCr 3.2)  Vancomycin levels as needed; Goal AUC 400-550 Monitor renal function and signs of clinical improvement Follow-up blood cultures  Height: 5' 9.5" (176.5 cm) Weight: 86.2 kg (190 lb) IBW/kg (Calculated) : 71.85  Temp (24hrs), Avg:97.9 F (36.6 C), Min:97.5 F (36.4 C), Max:98.7 F (37.1 C)  Recent Labs  Lab 08/28/21 1852 08/29/21 0721 08/30/21 0319 08/31/21 0311 09/01/21 0746 09/02/21 0319  WBC 15.2* 14.1* 9.3 7.6 6.2 4.3  CREATININE 3.85* 3.97* 3.59* 3.40* 3.19* 3.21*  LATICACIDVEN 1.3  --   --   --   --   --      Estimated Creatinine Clearance: 19.9 mL/min (A) (by C-G formula based on SCr of 3.21 mg/dL (H)).    Allergies  Allergen Reactions   Crestor [Rosuvastatin] Other (See Comments)    Muscle aches    Lipitor [Atorvastatin] Other (See Comments)    Muscle aches   Pravastatin Other (See Comments)    Muscle aches   Carvedilol     Other reaction(s): loss of appetite   Metformin Diarrhea   Serotonin Reuptake Inhibitors (Ssris)     Other reaction(s): Muscle aches   Zocor [Simvastatin]     Other reaction(s): Muscle pain   Codeine Itching and Other (See Comments)    Extremity tingling-- can take synthetic    Antimicrobials this admission: 1/5 vancomycin >>  1/5 Zosyn x 1 1/5 Ceftriaxone x 1 1/6 cefepime >>   Microbiology results: 1/5 BCx2: NGTD 1/9 wound culture:   Thank you for involving pharmacy in this patient's care.  Gretta Arab PharmD, BCPS Clinical Pharmacist [facility] [phone] 09/02/2021 8:10 AM

## 2021-09-02 NOTE — Progress Notes (Signed)
Progress Note  1 Day Post-Op  Subjective: Pain well controlled following procedure yesterday. He did not notice any bleeding overnight. No BM since procedure  Objective: Vital signs in last 24 hours: Temp:  [97.5 F (36.4 C)-98.7 F (37.1 C)] 97.8 F (36.6 C) (01/10 0451) Pulse Rate:  [64-76] 64 (01/10 0451) Resp:  [13-20] 17 (01/10 0451) BP: (121-129)/(69-97) 125/75 (01/10 0451) SpO2:  [95 %-100 %] 96 % (01/10 0451) Last BM Date: 08/31/21  Intake/Output from previous day: 01/09 0701 - 01/10 0700 In: 1001.5 [P.O.:50; I.V.:851.5; IV Piggyback:100.1] Out: -  Intake/Output this shift: No intake/output data recorded.  PE: General: pleasant, WD, elderly male who is laying in bed in NAD HEENT: head is normocephalic, atraumatic.  Sclera are noninjected.  Heart: regular, rate, and rhythm.   Lungs: Respiratory effort nonlabored Abd: soft, NT, ND GU: some continued mild erythema and induration of R buttock around penrose drain site and incision site. Packing removed with blood and small amount of purulent fluid on packing. Continued mild oozing from site and repacked with gauze. No purulent or bloody drainage around penrose  MS: all 4 extremities are symmetrical with no cyanosis, clubbing, or edema. Skin: warm and dry Psych: A&Ox3 with an appropriate affect.    Lab Results:  Recent Labs    09/01/21 0746 09/02/21 0319  WBC 6.2 4.3  HGB 9.4* 9.4*  HCT 27.8* 28.1*  PLT 154 175    BMET Recent Labs    09/01/21 0746 09/02/21 0319  NA 136 136  K 3.8 4.3  CL 104 104  CO2 24 22  GLUCOSE 101* 257*  BUN 30* 35*  CREATININE 3.19* 3.21*  CALCIUM 10.0 10.4*    PT/INR No results for input(s): LABPROT, INR in the last 72 hours. CMP     Component Value Date/Time   NA 136 09/02/2021 0319   NA 140 09/03/2017 0933   K 4.3 09/02/2021 0319   CL 104 09/02/2021 0319   CO2 22 09/02/2021 0319   GLUCOSE 257 (H) 09/02/2021 0319   BUN 35 (H) 09/02/2021 0319   BUN 24 09/03/2017  0933   CREATININE 3.21 (H) 09/02/2021 0319   CREATININE 1.66 (H) 06/05/2016 1413   CALCIUM 10.4 (H) 09/02/2021 0319   PROT 5.9 (L) 09/02/2021 0319   ALBUMIN 3.1 (L) 09/02/2021 0319   AST 17 09/02/2021 0319   ALT 14 09/02/2021 0319   ALKPHOS 65 09/02/2021 0319   BILITOT 0.7 09/02/2021 0319   GFRNONAA 19 (L) 09/02/2021 0319   GFRAA 18 (L) 03/07/2020 1046   Lipase  No results found for: LIPASE     Studies/Results: CT PELVIS WO CONTRAST  Result Date: 08/31/2021 CLINICAL DATA:  Evaluate for anal or rectal abscess. EXAM: CT PELVIS WITHOUT CONTRAST TECHNIQUE: Multidetector CT imaging of the pelvis was performed following the standard protocol without intravenous contrast. COMPARISON:  01/26/2022 FINDINGS: Urinary Tract:  No abnormality visualized. Bowel: Extensive sigmoid diverticulosis identified. Small volume of fluid is identified within the left lower quadrant adjacent to sigmoid colon, image 59/5. Not seen on previous exam. Vascular/Lymphatic: There is extensive aorto scratch set aortic atherosclerotic disease noted. No abdominopelvic adenopathy. Reproductive:  Prostate gland appears normal. Other: There is progressive, asymmetric increased soft tissue infiltration within the right gluteal fold. This area measures approximately 5.8 x 2.7 cm, image 27/5. Formally 4.8 by 2.0 cm. Central low attenuation compatible with underlying phlegmon. Underlying abscess cannot be excluded as assessment for focal fluid collection is limited due to lack of IV contrast  material. Musculoskeletal: No suspicious bone lesions identified. IMPRESSION: 1. Progressive, asymmetric increased soft tissue inflammation/infiltration within the right gluteal fold is identified compatible with cellulitis and phlegmon formation. Underlying abscess cannot be excluded as assessment for focal fluid collection is limited due to lack of IV contrast material. If confirmation of fluid collection is clinically indicated, and the patient is  unable to receive IV contrast material, then pelvic MRI without contrast may provide a more sensitive and specific assessment of this area. 2. Extensive sigmoid diverticulosis. 3. Small volume of fluid identified within the left lower quadrant adjacent to sigmoid colon. Not seen on previous exam. 4. Aortic Atherosclerosis (ICD10-I70.0). Electronically Signed   By: Kerby Moors M.D.   On: 08/31/2021 13:09    Anti-infectives: Anti-infectives (From admission, onward)    Start     Dose/Rate Route Frequency Ordered Stop   09/02/21 1000  vancomycin (VANCOREADY) IVPB 1250 mg/250 mL        1,250 mg 166.7 mL/hr over 90 Minutes Intravenous Every 48 hours 09/02/21 0810     08/31/21 1200  vancomycin (VANCOCIN) IVPB 1000 mg/200 mL premix  Status:  Discontinued        1,000 mg 200 mL/hr over 60 Minutes Intravenous Every 48 hours 08/28/21 2205 09/02/21 0810   08/29/21 1000  ceFEPIme (MAXIPIME) 2 g in sodium chloride 0.9 % 100 mL IVPB        2 g 200 mL/hr over 30 Minutes Intravenous Every 24 hours 08/29/21 0745     08/29/21 0900  vancomycin (VANCOREADY) IVPB 500 mg/100 mL        500 mg 100 mL/hr over 60 Minutes Intravenous  Once 08/29/21 0752 08/29/21 0921   08/28/21 2230  vancomycin (VANCOCIN) 500 mg in sodium chloride 0.9 % 500 mL IVPB  Status:  Discontinued       See Hyperspace for full Linked Orders Report.   500 mg 500 mL/hr over 60 Minutes Intravenous  Once 08/28/21 2126 08/29/21 0752   08/28/21 2130  vancomycin (VANCOCIN) IVPB 1000 mg/200 mL premix       See Hyperspace for full Linked Orders Report.   1,000 mg 200 mL/hr over 60 Minutes Intravenous  Once 08/28/21 2126 08/28/21 2241   08/28/21 2100  piperacillin-tazobactam (ZOSYN) IVPB 3.375 g        3.375 g 100 mL/hr over 30 Minutes Intravenous  Once 08/28/21 2054 08/28/21 2211   08/28/21 1915  cefTRIAXone (ROCEPHIN) 2 g in sodium chloride 0.9 % 100 mL IVPB  Status:  Discontinued        2 g 200 mL/hr over 30 Minutes Intravenous Every 24 hours  08/28/21 1911 08/28/21 2112        Assessment/Plan Right buttock cellulitis with perirectal abscess - CT 1/8: Progressive, asymmetric increased soft tissue inflammation/infiltration within the right gluteal fold is identified compatible with cellulitis and phlegmon formation. Underlying abscess cannot be excluded POD1 s/p incision and drainage of perirectal abscess by Dr. Kieth Brightly 1/9 - penrose placement - wound looks good. Repacked this am for hemostasis. Packing can be removed this afternoon or tomorrow am - follow up in 10-14 days for drain removal - will discuss with MD timing of resuming eliquis   FEN: HH VTE: eliquis on hold ID: [email or web address]   - below per TRH -  Chronic A. 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

## 2021-09-02 NOTE — Plan of Care (Signed)
  Problem: Education: Goal: Knowledge of General Education information will improve Description: Including pain rating scale, medication(s)/side effects and non-pharmacologic comfort measures Outcome: Progressing   Problem: Activity: Goal: Risk for activity intolerance will decrease Outcome: Progressing   Problem: Pain Managment: Goal: General experience of comfort will improve Outcome: Progressing   

## 2021-09-02 NOTE — Discharge Summary (Signed)
Physician Discharge Summary  [name] LGX:[id number] DOB: 1946/02/23 DOA: 08/28/2021  PCP: [name], MD  Admit date: 08/28/2021 Discharge date: 09/02/2021  Time spent: 33 minutes  Recommendations for Outpatient Follow-up:  Complete Augmentin as dictated below Pain control called in for patient to his pharmacy Repeat Chem-12 CBC in about 1 week Needs outpatient PCP follow-up in 1 to 2 weeks possibly could be seen by them for wound check  Discharge Diagnoses:  MAIN problem for hospitalization   Cellulitis and abscess of right buttock status post I&D 1/9 General surgery  Please see below for itemized issues addressed in [place]- refer to other progress notes for clarity if needed  Discharge Condition: Improved  Diet recommendation: Diabetic heart healthy  Filed Weights   08/28/21 1659  Weight: 86.2 kg    History of present illness:  76 year old home dwelling white male Chronic A. 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   Reported 3 separate episodes of small abscesses in his buttock region perirectal-found to have temperature one 1.2 systolic blood pressure 90, WBC 15 creatinine 3.8 which is stable On admission CT scan showed perirectal inflammation with stranding suspicious for cellulitis Started on Vanco and Zosyn   Patient evaluated 1/6 by general surgery felt to be nonsurgical but because of increasing swelling in the rectum-reevaluated on 1/9 and may be going for I&D  Hospital Course:  Perirectal cellulitis Status post I&D 1/9 2/2 CT findings 1/18 showing increasing size of abscess Vanco and  cefepime de-escalated to Augmentin will complete a total of 14 days Pain control is moderate on Percocet 1-2 every 4 as needed moderate--medications: Chronic A. fib CHADS2 score >4 CAD CABG 1995 MV repair 03/2016 HFpEF Stable at this time continue amiodarone 100 daily --resume eliquis 5 twice daily  Lasix 40 daily was held during hospitalization but resumed on discharge Resume Praluent 75 mg q. 14 days on discharge Dilutional anemia without any bleeding Only monitor at this time Controlled DM TY 2 Resume Ozempic on discharge Sugars are well controlled in the 90s to 100 range-remained stable CKD 4 at baseline Stable at this time--consider lower dose of Lasix on discharge on follow-up Prior stroke 01/2021 Peroneal nerve related right-sided foot drop Deficits minimal--outpatient follow-up  Procedures: Incision and drainage 1/9 General surgeon Dr. Cherlyn Roberts   Discharge Exam: Vitals:   09/01/21 2046 09/02/21 0451  BP: 121/81 125/75  Pulse: 76 64  Resp: 17 17  Temp: 98 F (36.7 C) 97.8 F (36.6 C)  SpO2: 96% 96%    Subj on day of d/c   Awake coherent no distress-- Not in significant pain only with dressing change  General Exam on discharge  Pleasant coherent no distress EOMI NCAT no focal deficit CTA B no added sound no rales no rhonchi Abdomen soft no rebound no guarding ROM intact no focal deficit Neurologically is intact moving all 4 limbs equally and is mobile  Discharge Instructions   Discharge Instructions     Diet - low sodium heart healthy   Complete by: As directed    Discharge instructions   Complete by: As directed    Please perform wound care as per general surgery Complete course of Augmentin  which is to ensure that abscess clears up Follow-up in the outpatient setting with her usual specialist in about 1 to 2 weeks and get labs at that time Do not expect will need outpatient general surgery follow-up--- wound should heal on its own Please see  separate instructions from them   Discharge wound care:   Complete by: As directed    As per general surgery   Increase activity slowly   Complete by: As directed       Allergies as of 09/02/2021       Reactions   Crestor [rosuvastatin] Other (See Comments)   Muscle aches   Lipitor [atorvastatin] Other (See Comments)   Muscle aches   Pravastatin Other (See Comments)   Muscle aches   Carvedilol    Other reaction(s): loss of appetite   Metformin Diarrhea   Serotonin Reuptake Inhibitors (ssris)    Other reaction(s): Muscle aches   Zocor [simvastatin]    Other reaction(s): Muscle pain   Codeine Itching, Other (See Comments)   Extremity tingling-- can take synthetic        Medication List     STOP taking these medications    HYDROcodone-acetaminophen 5-325 MG tablet Commonly known as: NORCO/VICODIN   potassium chloride SA 20 MEQ tablet Commonly known as: KLOR-CON M       TAKE these medications    acetaminophen 325 MG tablet Commonly known as: TYLENOL Take 2 tablets (650 mg total) by mouth every 6 (six) hours as needed for mild pain (or Fever >/= 101).   allopurinol 100 MG tablet Commonly known as: ZYLOPRIM Take 0.5 tablets (50 mg total) by mouth 2 (two) times a week. Please discuss dosing with your PCP based on your renal function.   amiodarone 200 MG tablet Commonly known as: PACERONE Take 0.5 tablets (100 mg total) by mouth daily.   amoxicillin-clavulanate 875-125 MG tablet Commonly known as: Augmentin Take 1 tablet by mouth 2 (two) times daily for 8 days.   apixaban 5 MG Tabs tablet Commonly known as: Eliquis Take 1 tablet (5 mg total) by mouth 2 (two) times daily.   calcitRIOL 0.25 MCG capsule Commonly known as: ROCALTROL Take 0.25 mcg by mouth every other day.   Carboxymethylcellulose Sodium 1 % Gel Place 1 drop into both eyes 3 (three) times daily as needed (dry eyes).   cetirizine 10 MG tablet Commonly known as: ZYRTEC Take 10 mg by mouth  daily.   colchicine 0.6 MG tablet Take 0.6-1.2 mg by mouth 2 (two) times daily as needed (gout).   diclofenac Sodium 1 % Gel Commonly known as: VOLTAREN Apply 2 g topically 3 (three) times daily as needed for pain.   Efudex 5 % cream Generic drug: fluorouracil Apply 1 application topically daily.   fluticasone 50 MCG/ACT nasal spray Commonly known as: FLONASE Place 1 spray into both nostrils daily as needed for allergies or rhinitis.   furosemide 40 MG tablet Commonly known as: LASIX Take 40 mg by mouth daily.   methocarbamol 500 MG tablet Commonly known as: ROBAXIN Take 500 mg by mouth 3 (three) times daily as needed for muscle spasms.   multivitamin capsule Take 1 capsule by mouth daily.   nitroGLYCERIN 0.4 MG SL tablet Commonly known as: NITROSTAT 1 TAB UNDER THE TONGUE EVERY 5 MINUTES AS NEEDED FOR CHEST PAIN UP TO 3 DOSES, IF PERSIST CALL 911 What changed:  how much to take when to take this reasons to take this additional instructions   OneTouch Verio  test strip Generic drug: glucose blood 1 each daily.   oxyCODONE-acetaminophen 5-325 MG tablet Commonly known as: PERCOCET/ROXICET Take 1-2 tablets by mouth every 4 (four) hours as needed for moderate pain.   Ozempic (0.25 or 0.5 MG/DOSE) 2 MG/1.5ML Sopn Generic drug: Semaglutide(0.25 or 0.5MG /DOS) Inject 0.375 mLs (0.5 mg total) into the skin every Thursday. What changed:  when to take this additional instructions   pantoprazole 40 MG tablet Commonly known as: PROTONIX Take 40 mg by mouth daily as needed (heartburn).   Praluent 75 MG/ML Soaj Generic drug: Alirocumab Inject 75 mg into the skin every 14 (fourteen) days.   sildenafil 100 MG tablet Commonly known as: VIAGRA Take 50 mg by mouth daily as needed for erectile dysfunction.   traZODone 100 MG tablet Commonly known as: DESYREL Take 100 mg by mouth at bedtime as needed for sleep.   vitamin C with rose hips 1000 MG tablet Take 1,000 mg by  mouth daily.               Discharge Care Instructions  (From admission, onward)           Start     Ordered   09/02/21 0000  Discharge wound care:       Comments: As per general surgery   09/02/21 1126           Allergies  Allergen Reactions   Crestor [Rosuvastatin] Other (See Comments)    Muscle aches    Lipitor [Atorvastatin] Other (See Comments)    Muscle aches   Pravastatin Other (See Comments)    Muscle aches   Carvedilol     Other reaction(s): loss of appetite   Metformin Diarrhea   Serotonin Reuptake Inhibitors (Ssris)     Other reaction(s): Muscle aches   Zocor [Simvastatin]     Other reaction(s): Muscle pain   Codeine Itching and Other (See Comments)    Extremity tingling-- can take synthetic    Follow-up Information     Surgery, [place]. Go to.   Specialty: General Surgery Why: please follow up on 09/09/21 at 9:45 am for wound check. Please arrive 30 minutes early to complete check in process and bring photo id and insurance card if you have them Contact information: [place] [address] [phone]                  The results of significant diagnostics from this hospitalization (including imaging, microbiology, ancillary and laboratory) are listed below for reference.    Significant Diagnostic Studies: CT PELVIS WO CONTRAST  Result Date: 08/31/2021 CLINICAL DATA:  Evaluate for anal or rectal abscess. EXAM: CT PELVIS WITHOUT CONTRAST TECHNIQUE: Multidetector CT imaging of the pelvis was performed following the standard protocol without intravenous contrast. COMPARISON:  01/26/2022 FINDINGS: Urinary Tract:  No abnormality visualized. Bowel: Extensive sigmoid diverticulosis identified. Small volume of fluid is identified within the left lower quadrant adjacent to sigmoid colon, image 59/5. Not seen on previous exam. Vascular/Lymphatic: There is extensive aorto scratch set aortic atherosclerotic disease  noted. No abdominopelvic adenopathy. Reproductive:  Prostate gland appears normal. Other: There is progressive, asymmetric increased soft tissue infiltration within the right gluteal fold. This area measures approximately 5.8 x 2.7 cm, image 27/5. Formally 4.8 by 2.0 cm. Central low attenuation compatible with underlying phlegmon. Underlying abscess cannot be excluded as assessment for focal fluid collection is limited due to lack of IV contrast material. Musculoskeletal: No suspicious bone lesions identified. IMPRESSION: 1. Progressive,  asymmetric increased soft tissue inflammation/infiltration within the right gluteal fold is identified compatible with cellulitis and phlegmon formation. Underlying abscess cannot be excluded as assessment for focal fluid collection is limited due to lack of IV contrast material. If confirmation of fluid collection is clinically indicated, and the patient is unable to receive IV contrast material, then pelvic MRI without contrast may provide a more sensitive and specific assessment of this area. 2. Extensive sigmoid diverticulosis. 3. Small volume of fluid identified within the left lower quadrant adjacent to sigmoid colon. Not seen on previous exam. 4. Aortic Atherosclerosis (ICD10-I70.0). Electronically Signed   By: Kerby Moors M.D.   On: 08/31/2021 13:09   CT PELVIS WO CONTRAST  Result Date: 08/28/2021 CLINICAL DATA:  Anal/rectal abscess. EXAM: CT PELVIS WITHOUT CONTRAST TECHNIQUE: Multidetector CT imaging of the pelvis was performed following the standard protocol without intravenous contrast. COMPARISON:  CT abdomen and pelvis 01/07/2021. FINDINGS: Urinary Tract:  No abnormality visualized. Bowel: No dilated bowel loops are seen. There is sigmoid colon diverticulosis without evidence for acute diverticulitis. The visualized portion of the appendix is within normal limits. Vascular/Lymphatic: There are atherosclerotic calcifications of the aorta. The visualized aorta is  normal in size. No enlarged lymph nodes are identified. Reproductive:  Prostate gland is mildly enlarged. Other: There is no ascites or free air. There are small fat containing bilateral inguinal hernias. There is ill-defined inflammatory stranding surrounding the posterior/right rectum extending into the subcutaneous tissues of the right buttocks. No soft tissue gas identified. No obvious fluid collections allowing for lack of intravenous contrast. No evidence for foreign body. Musculoskeletal: No suspicious bone lesions identified. Degenerative changes are seen at L4-L5. IMPRESSION: 1. Posterior and right perirectal inflammatory stranding extending into the buttocks. Findings may represent cellulitis or phlegmon. No evidence for soft tissue gas or obvious drainable fluid collection. 2. Colonic diverticulosis. 3.  Aortic Atherosclerosis (ICD10-I70.0). Electronically Signed   By: Ronney Asters M.D.   On: 08/28/2021 20:26    Microbiology: Recent Results (from the past 240 hour(s))  Culture, blood (routine x 2)     Status: None   Collection Time: 08/28/21  6:54 PM   Specimen: BLOOD RIGHT FOREARM  Result Value Ref Range Status   Specimen Description   Final    BLOOD RIGHT FOREARM Performed at [facility], [address] [place], [place] [address]    Special Requests   Final    BOTTLES DRAWN AEROBIC AND ANAEROBIC Blood Culture adequate volume Performed at [facility], [address], [place], [place] [address]    Culture   Final    NO GROWTH 5 DAYS Performed at [facility], [place] [address] [place], [place] [address]    Report Status 09/02/2021 FINAL  Final  Culture, blood (routine x 2)     Status: None   Collection Time: 08/28/21  6:54 PM   Specimen: BLOOD RIGHT HAND  Result Value Ref Range Status   Specimen Description   Final    BLOOD RIGHT HAND Performed at [facility], [address] [place], [place] [address]    Special Requests   Final     BOTTLES DRAWN AEROBIC AND ANAEROBIC Blood Culture adequate volume Performed at [facility], [address] [place], [place] [address]    Culture   Final    NO GROWTH 5 DAYS Performed at [facility], [place] [address] [place],  [address]  Report Status 09/02/2021 FINAL  Final  Resp Panel by RT-PCR (Flu A&B, Covid) Nasopharyngeal Swab     Status: None   Collection Time: 08/28/21  7:34 PM   Specimen: Nasopharyngeal Swab; Nasopharyngeal(NP) swabs in vial transport medium  Result Value Ref Range Status   SARS Coronavirus 2 by RT PCR NEGATIVE NEGATIVE Final    Comment: (NOTE) SARS-CoV-2 target nucleic acids are NOT DETECTED.  The SARS-CoV-2 RNA is generally detectable in upper respiratory specimens during the acute phase of infection. The lowest concentration of SARS-CoV-2 viral copies this assay can detect is 138 copies/mL. A negative result does not preclude SARS-Cov-2 infection and should not be used as the sole basis for treatment or other patient management decisions. A negative result may occur with  improper specimen collection/handling, submission of specimen other than nasopharyngeal swab, presence of viral mutation(s) within the areas targeted by this assay, and inadequate number of viral copies(<138 copies/mL). A negative result must be combined with clinical observations, patient history, and epidemiological information. The expected result is Negative.  Fact Sheet for Patients:  EntrepreneurPulse.com.au  Fact Sheet for Healthcare Providers:  IncredibleEmployment.be  This test is no t yet approved or cleared by the [place] FDA and  has been authorized for detection and/or diagnosis of SARS-CoV-2 by FDA under an Emergency Use Authorization (EUA). This EUA will remain  in effect (meaning this test can be used) for the duration of the COVID-19 declaration under Section 564(b)(1) of the Act, 21 U.S.C.section  360bbb-3(b)(1), unless the authorization is terminated  or revoked sooner.       Influenza A by PCR NEGATIVE NEGATIVE Final   Influenza B by PCR NEGATIVE NEGATIVE Final    Comment: (NOTE) The Xpert Xpress SARS-CoV-2/FLU/RSV plus assay is intended as an aid in the diagnosis of influenza from Nasopharyngeal swab specimens and should not be used as a sole basis for treatment. Nasal washings and aspirates are unacceptable for Xpert Xpress SARS-CoV-2/FLU/RSV testing.  Fact Sheet for Patients: EntrepreneurPulse.com.au  Fact Sheet for Healthcare Providers: IncredibleEmployment.be  This test is not yet approved or cleared by the [place] FDA and has been authorized for detection and/or diagnosis of SARS-CoV-2 by FDA under an Emergency Use Authorization (EUA). This EUA will remain in effect (meaning this test can be used) for the duration of the COVID-19 declaration under Section 564(b)(1) of the Act, 21 U.S.C. section 360bbb-3(b)(1), unless the authorization is terminated or revoked.  Performed at [facility], [address] [place], [place] [address]   Aerobic/Anaerobic Culture w Gram Stain (surgical/deep wound)     Status: None (Preliminary result)   Collection Time: 09/01/21  3:02 PM   Specimen: PATH Soft tissue  Result Value Ref Range Status   Specimen Description   Final    ABSCESS PERIRECTAL Performed at [facility], [place] [address] [place], [name] [address]    Special Requests   Final    NONE Performed at [facility], [place] [address], [place], [place] [address]    Gram Stain   Final    MODERATE WBC PRESENT,BOTH PMN AND MONONUCLEAR FEW GRAM POSITIVE COCCI Performed at [facility], [place] [address] [place], [place] [address]    Culture PENDING  Incomplete   Report Status PENDING  Incomplete     Labs: Basic Metabolic Panel: Recent Labs  Lab 08/29/21 0721  08/30/21 0319 08/31/21 0311 09/01/21 0746 09/02/21 0319  NA 131* 134* 135 136 136  K 3.8  3.4* 3.5 3.8 4.3  CL 102 104 104 104 104  CO2 22 23 24 24 22   GLUCOSE 120* 96 101* 101* 257*  BUN 44* 41* 39* 30* 35*  CREATININE 3.97* 3.59* 3.40* 3.19* 3.21*  CALCIUM 9.4 9.5 9.8 10.0 10.4*   Liver Function Tests: Recent Labs  Lab 08/28/21 1852 09/02/21 0319  AST 20 17  ALT 15 14  ALKPHOS 78 65  BILITOT 1.2 0.7  PROT 6.7 5.9*  ALBUMIN 3.9 3.1*   No results for input(s): LIPASE, AMYLASE in the last 168 hours. No results for input(s): AMMONIA in the last 168 hours. CBC: Recent Labs  Lab 08/28/21 1852 08/29/21 0721 08/30/21 0319 08/31/21 0311 09/01/21 0746 09/02/21 0319  WBC 15.2* 14.1* 9.3 7.6 6.2 4.3  NEUTROABS 13.7*  --  7.1 5.6 4.2 3.9  HGB 11.5* 10.3* 8.5* 8.8* 9.4* 9.4*  HCT 33.6* 30.4* 25.5* 26.2* 27.8* 28.1*  MCV 96.8 97.7 98.8 99.2 97.9 97.6  PLT 149* 133* 137* 150 154 175   Cardiac Enzymes: No results for input(s): CKTOTAL, CKMB, CKMBINDEX, TROPONINI in the last 168 hours. BNP: BNP (last 3 results) Recent Labs    10/04/20 1331 02/26/21 0901  BNP 120.8* 762.7*    ProBNP (last 3 results) No results for input(s): PROBNP in the last 8760 hours.  CBG: Recent Labs  Lab 09/01/21 1523 09/01/21 1700 09/01/21 2048 09/02/21 0452 09/02/21 0729  GLUCAP 88 98 346* 189* 177*       Signed:  [name] MD   [facility] 09/02/2021, 11:28 AM

## 2021-09-03 ENCOUNTER — Other Ambulatory Visit (HOSPITAL_COMMUNITY): Payer: Self-pay | Admitting: Cardiology

## 2021-09-03 ENCOUNTER — Other Ambulatory Visit: Payer: Self-pay | Admitting: Internal Medicine

## 2021-09-03 DIAGNOSIS — L0291 Cutaneous abscess, unspecified: Secondary | ICD-10-CM

## 2021-09-03 DIAGNOSIS — E785 Hyperlipidemia, unspecified: Secondary | ICD-10-CM

## 2021-09-03 DIAGNOSIS — I208 Other forms of angina pectoris: Secondary | ICD-10-CM

## 2021-09-03 DIAGNOSIS — R079 Chest pain, unspecified: Secondary | ICD-10-CM

## 2021-09-03 DIAGNOSIS — I2510 Atherosclerotic heart disease of native coronary artery without angina pectoris: Secondary | ICD-10-CM

## 2021-09-03 LAB — GLUCOSE, CAPILLARY: Glucose-Capillary: 139 mg/dL — ABNORMAL HIGH (ref 70–99)

## 2021-09-03 MED ORDER — DOXYCYCLINE HYCLATE 100 MG PO TABS: 100.0000 mg | ORAL_TABLET | Freq: Two times a day (BID) | ORAL | Status: AC

## 2021-09-03 MED ORDER — AMOXICILLIN-POT CLAVULANATE 500-125 MG PO TABS: 1.0000 | ORAL_TABLET | Freq: Two times a day (BID) | ORAL | 0 refills | Status: AC

## 2021-09-03 MED ORDER — APIXABAN 5 MG PO TABS: 5.0000 mg | ORAL_TABLET | Freq: Two times a day (BID) | ORAL | 3 refills | Status: AC

## 2021-09-03 NOTE — Progress Notes (Signed)
Progress Note  2 Days Post-Op  Subjective: No noticeable draining or bleeding yesterday/overnight. Pain controlled. No BM yet but feels like he needs to have one soon  Objective: Vital signs in last 24 hours: Temp:  [97.8 F (36.6 C)-98.2 F (36.8 C)] 98 F (36.7 C) (01/11 0622) Pulse Rate:  [61-76] 61 (01/11 0622) Resp:  [17-18] 17 (01/11 0622) BP: (122-131)/(69-75) 126/69 (01/11 0622) SpO2:  [89 %-99 %] 97 % (01/11 0622) Last BM Date: 08/31/21  Intake/Output from previous day: 01/10 0701 - 01/11 0700 In: 720 [P.O.:720] Out: -  Intake/Output this shift: No intake/output data recorded.  PE: General: pleasant, WD, elderly male who is laying in bed in NAD HEENT: head is normocephalic, atraumatic.  Sclera are noninjected.  Heart: regular, rate, and rhythm.   Lungs: Respiratory effort nonlabored Abd: soft, NT, ND GU: improved mild erythema and induration of R buttock around penrose drain site and incision site. Packing removed with small amount of blood on gauze. No oozing from site. No purulent or bloody drainage around penrose  MS: all 4 extremities are symmetrical with no cyanosis, clubbing, or edema. Skin: warm and dry Psych: A&Ox3 with an appropriate affect.    Lab Results:  Recent Labs    09/01/21 0746 09/02/21 0319  WBC 6.2 4.3  HGB 9.4* 9.4*  HCT 27.8* 28.1*  PLT 154 175    BMET Recent Labs    09/01/21 0746 09/02/21 0319  NA 136 136  K 3.8 4.3  CL 104 104  CO2 24 22  GLUCOSE 101* 257*  BUN 30* 35*  CREATININE 3.19* 3.21*  CALCIUM 10.0 10.4*    PT/INR No results for input(s): LABPROT, INR in the last 72 hours. CMP     Component Value Date/Time   NA 136 09/02/2021 0319   NA 140 09/03/2017 0933   K 4.3 09/02/2021 0319   CL 104 09/02/2021 0319   CO2 22 09/02/2021 0319   GLUCOSE 257 (H) 09/02/2021 0319   BUN 35 (H) 09/02/2021 0319   BUN 24 09/03/2017 0933   CREATININE 3.21 (H) 09/02/2021 0319   CREATININE 1.66 (H) 06/05/2016 1413    CALCIUM 10.4 (H) 09/02/2021 0319   PROT 5.9 (L) 09/02/2021 0319   ALBUMIN 3.1 (L) 09/02/2021 0319   AST 17 09/02/2021 0319   ALT 14 09/02/2021 0319   ALKPHOS 65 09/02/2021 0319   BILITOT 0.7 09/02/2021 0319   GFRNONAA 19 (L) 09/02/2021 0319   GFRAA 18 (L) 03/07/2020 1046   Lipase  No results found for: LIPASE     Studies/Results: No results found.  Anti-infectives: Anti-infectives (From admission, onward)    Start     Dose/Rate Route Frequency Ordered Stop   09/02/21 1400  amoxicillin-clavulanate (AUGMENTIN) 875-125 MG per tablet 1 tablet        1 tablet Oral Every 12 hours 09/02/21 1134     09/02/21 1000  vancomycin (VANCOREADY) IVPB 1250 mg/250 mL  Status:  Discontinued        1,250 mg 166.7 mL/hr over 90 Minutes Intravenous Every 48 hours 09/02/21 0810 09/02/21 1134   09/02/21 0000  amoxicillin-clavulanate (AUGMENTIN) 875-125 MG tablet        1 tablet Oral 2 times daily 09/02/21 1126 09/10/21 2359   08/31/21 1200  vancomycin (VANCOCIN) IVPB 1000 mg/200 mL premix  Status:  Discontinued        1,000 mg 200 mL/hr over 60 Minutes Intravenous Every 48 hours 08/28/21 2205 09/02/21 0810   08/29/21 1000  ceFEPIme (MAXIPIME) 2 g in sodium chloride 0.9 % 100 mL IVPB  Status:  Discontinued        2 g 200 mL/hr over 30 Minutes Intravenous Every 24 hours 08/29/21 0745 09/02/21 1134   08/29/21 0900  vancomycin (VANCOREADY) IVPB 500 mg/100 mL        500 mg 100 mL/hr over 60 Minutes Intravenous  Once 08/29/21 0752 08/29/21 0921   08/28/21 2230  vancomycin (VANCOCIN) 500 mg in sodium chloride 0.9 % 500 mL IVPB  Status:  Discontinued       See Hyperspace for full Linked Orders Report.   500 mg 500 mL/hr over 60 Minutes Intravenous  Once 08/28/21 2126 08/29/21 0752   08/28/21 2130  vancomycin (VANCOCIN) IVPB 1000 mg/200 mL premix       See Hyperspace for full Linked Orders Report.   1,000 mg 200 mL/hr over 60 Minutes Intravenous  Once 08/28/21 2126 08/28/21 2241   08/28/21 2100   piperacillin-tazobactam (ZOSYN) IVPB 3.375 g        3.375 g 100 mL/hr over 30 Minutes Intravenous  Once 08/28/21 2054 08/28/21 2211   08/28/21 1915  cefTRIAXone (ROCEPHIN) 2 g in sodium chloride 0.9 % 100 mL IVPB  Status:  Discontinued        2 g 200 mL/hr over 30 Minutes Intravenous Every 24 hours 08/28/21 1911 08/28/21 2112        Assessment/Plan Right buttock cellulitis with perirectal abscess - CT 1/8: Progressive, asymmetric increased soft tissue inflammation/infiltration within the right gluteal fold is identified compatible with cellulitis and phlegmon formation. Underlying abscess cannot be excluded POD2 s/p incision and drainage of perirectal abscess by Dr. Kieth Brightly 1/9 - penrose placement - wound looks good. No bleeding this am and packing removed and remains out - follow up in 10-14 days for drain removal - resume eliquis in 2 days  FEN: HH VTE: eliquis on hold ID: [email or web address]   - below per TRH -  Chronic A. 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

## 2021-09-03 NOTE — Progress Notes (Signed)
AVS reviewed with patient. All questions answered. Wife called and updated and is on the way to pick up pt.

## 2021-09-03 NOTE — Progress Notes (Signed)
Added doxycycline to the regimen based on the cultures.

## 2021-09-04 ENCOUNTER — Other Ambulatory Visit: Payer: Self-pay | Admitting: *Deleted

## 2021-09-04 DIAGNOSIS — K5900 Constipation, unspecified: Secondary | ICD-10-CM | POA: Diagnosis not present

## 2021-09-04 DIAGNOSIS — I509 Heart failure, unspecified: Secondary | ICD-10-CM | POA: Diagnosis not present

## 2021-09-04 DIAGNOSIS — E1122 Type 2 diabetes mellitus with diabetic chronic kidney disease: Secondary | ICD-10-CM | POA: Diagnosis not present

## 2021-09-04 DIAGNOSIS — K219 Gastro-esophageal reflux disease without esophagitis: Secondary | ICD-10-CM | POA: Diagnosis not present

## 2021-09-04 DIAGNOSIS — I2510 Atherosclerotic heart disease of native coronary artery without angina pectoris: Secondary | ICD-10-CM | POA: Diagnosis not present

## 2021-09-04 DIAGNOSIS — L03317 Cellulitis of buttock: Secondary | ICD-10-CM | POA: Diagnosis not present

## 2021-09-04 DIAGNOSIS — I482 Chronic atrial fibrillation, unspecified: Secondary | ICD-10-CM | POA: Diagnosis not present

## 2021-09-04 DIAGNOSIS — Z792 Long term (current) use of antibiotics: Secondary | ICD-10-CM | POA: Diagnosis not present

## 2021-09-04 DIAGNOSIS — Z794 Long term (current) use of insulin: Secondary | ICD-10-CM | POA: Diagnosis not present

## 2021-09-04 DIAGNOSIS — N184 Chronic kidney disease, stage 4 (severe): Secondary | ICD-10-CM | POA: Diagnosis not present

## 2021-09-04 DIAGNOSIS — Z7901 Long term (current) use of anticoagulants: Secondary | ICD-10-CM | POA: Diagnosis not present

## 2021-09-04 DIAGNOSIS — E785 Hyperlipidemia, unspecified: Secondary | ICD-10-CM | POA: Diagnosis not present

## 2021-09-04 DIAGNOSIS — A419 Sepsis, unspecified organism: Secondary | ICD-10-CM | POA: Diagnosis not present

## 2021-09-04 DIAGNOSIS — M109 Gout, unspecified: Secondary | ICD-10-CM | POA: Diagnosis not present

## 2021-09-04 NOTE — Patient Outreach (Signed)
[facility]) Care Management  [place]  09/04/2021   [name] 1946/04/08 [id number]   Initial outreach to member/wife unsuccessful, voice message left.  Incoming call received back from wife.  Follow up with surgeon and cardiology both next week.  Denies any urgent concerns, encouraged to contact this care manager with questions.    Encounter Medications:  Outpatient Encounter Medications as of 09/04/2021  Medication Sig   acetaminophen (TYLENOL) 325 MG tablet Take 2 tablets (650 mg total) by mouth every 6 (six) hours as needed for mild pain (or Fever >/= 101).   Alirocumab (PRALUENT) 75 MG/ML SOAJ Inject 75 mg into the skin every 14 (fourteen) days.   allopurinol (ZYLOPRIM) 100 MG tablet Take 0.5 tablets (50 mg total) by mouth 2 (two) times a week. Please discuss dosing with your PCP based on your renal function.   amiodarone (PACERONE) 200 MG tablet Take 0.5 tablets (100 mg total) by mouth daily.   amoxicillin-clavulanate (AUGMENTIN) 500-125 MG tablet Take 1 tablet (500 mg total) by mouth in the morning and at bedtime for 7 days.   [START ON 09/05/2021] apixaban (ELIQUIS) 5 MG TABS tablet Take 1 tablet (5 mg total) by mouth 2 (two) times daily.   Ascorbic Acid (VITAMIN C WITH ROSE HIPS) 1000 MG tablet Take 1,000 mg by mouth daily.   calcitRIOL (ROCALTROL) 0.25 MCG capsule Take 0.25 mcg by mouth every other day.   Carboxymethylcellulose Sodium 1 % GEL Place 1 drop into both eyes 3 (three) times daily as needed (dry eyes).   cetirizine (ZYRTEC) 10 MG tablet Take 10 mg by mouth daily.   colchicine 0.6 MG tablet Take 0.6-1.2 mg by mouth 2 (two) times daily as needed (gout).    diclofenac Sodium (VOLTAREN) 1 % GEL Apply 2 g topically 3 (three) times daily as needed for pain.   EFUDEX 5 % cream Apply 1 application topically daily.   fluticasone (FLONASE) 50 MCG/ACT nasal spray Place 1 spray into both nostrils daily as needed for allergies or rhinitis.    furosemide (LASIX) 40 MG tablet Take 40 mg by mouth daily.   methocarbamol (ROBAXIN) 500 MG tablet Take 500 mg by mouth 3 (three) times daily as needed for muscle spasms.   Multiple Vitamin (MULTIVITAMIN) capsule Take 1 capsule by mouth daily.   ONETOUCH VERIO test strip 1 each daily.   oxyCODONE-acetaminophen (PERCOCET/ROXICET) 5-325 MG tablet Take 1-2 tablets by mouth every 4 (four) hours as needed for moderate pain.   pantoprazole (PROTONIX) 40 MG tablet Take 40 mg by mouth daily as needed (heartburn).   Semaglutide,0.25 or 0.5MG/DOS, (OZEMPIC, 0.25 OR 0.5 MG/DOSE,) 2 MG/1.5ML SOPN Inject 0.375 mLs (0.5 mg total) into the skin every Thursday. (Patient taking differently: Inject 0.5 mg into the skin once a week. Tuesday)   sildenafil (VIAGRA) 100 MG tablet Take 50 mg by mouth daily as needed for erectile dysfunction. (Patient not taking: Reported on 08/29/2021)   traZODone (DESYREL) 100 MG tablet Take 100 mg by mouth at bedtime as needed for sleep.   Facility-Administered Encounter Medications as of 09/04/2021  Medication   doxycycline (VIBRA-TABS) tablet 100 mg    Functional Status:  In your present state of health, do you have any difficulty performing the following activities: 08/28/2021 02/26/2021  Hearing? N -  Vision? N -  Difficulty concentrating or making decisions? N -  Walking or climbing stairs? Y -  Comment - -  Dressing or bathing? N -  Doing errands, shopping?  N N  Some recent data might be hidden    Fall/Depression Screening: Fall Risk  04/23/2021 04/01/2021 03/27/2021  Falls in the past year? 1 1 1   Number falls in past yr: 1 1 1   Injury with Fall? 1 1 1   Comment - skin tears, bruises -  Risk for fall due to : - - History of fall(s)  Follow up Falls prevention discussed - Falls evaluation completed   PHQ 2/9 Scores 04/23/2021 03/27/2021 03/25/2021 03/10/2018 11/22/2017 08/21/2016 05/11/2016  PHQ - 2 Score 0 0 0 0 0 0 0    Assessment:   Care Plan Care Plan : General Plan of  Care (Adult)  Updates made by Valente David, RN since 09/04/2021 12:00 AM     Problem: Management of chronic medical conditions (HTN, CHF, A-fib)   Priority: High     Long-Range Goal: Member will be able to adequately verbalize plan of care for management of chronic medical conditions   Start Date: 08/07/2021  Expected End Date: 02/03/2022  This Visit's Progress: On track  Priority: High  Note:   Current Barriers:  Chronic Disease Management support and education needs related to Atrial Fibrillation, CHF, and HTN  RNCM Clinical Goal(s):  Patient will verbalize understanding of plan for management of Atrial Fibrillation, CHF, and HTN as evidenced by verbalizing plan of care take all medications exactly as prescribed and will call provider for medication related questions as evidenced by member reported adherence    attend all scheduled medical appointments: Neurology, PCP, cardiology as evidenced by Member reported adherence        continue to work with RN Care Manager and/or Social Worker to address care management and care coordination needs related to Atrial Fibrillation, CHF, and HTN as evidenced by adherence to CM Team Scheduled appointments     through collaboration with RN Care manager, provider, and care team.   Interventions: Inter-disciplinary care team collaboration (see longitudinal plan of care) Evaluation of current treatment plan related to  self management and patient's adherence to plan as established by provider   A-fib:  (Status: Goal on Track (progressing): YES.) Long Term Goal  Counseled on increased risk of stroke due to Afib and benefits of anticoagulation for stroke prevention           Reviewed importance of adherence to anticoagulant exactly as prescribed Afib action plan reviewed Screening for signs and symptoms of depression related to chronic disease state  Heart Failure Interventions:  (Status: Goal on Track (progressing): YES.)  Long Term Goal  Provided  education on low sodium diet Reviewed Heart Failure Action Plan in depth and provided written copy Discussed importance of daily weight and advised patient to weigh and record daily  Hypertension: (Status: Goal on Track (progressing): YES.) Last practice recorded BP readings:  BP Readings from Last 3 Encounters:  06/19/21 110/62  06/17/21 122/60  05/21/21 125/71  Most recent eGFR/CrCl: No results found for: EGFR  No components found for: CRCL  Provided education to patient re: stroke prevention, s/s of heart attack and stroke; Reviewed medications with patient and discussed importance of compliance;  Discussed plans with patient for ongoing care management follow up and provided patient with direct contact information for care management team;  Patient Goals/Self-Care Activities: Take medications as prescribed   Attend all scheduled provider appointments Call provider office for new concerns or questions  call office if I gain more than 2 pounds in one day or 5 pounds in one week keep legs up  while sitting track weight in diary use salt in moderation watch for swelling in feet, ankles and legs every day - check pulse (heart) rate once a day - take medicine as prescribed check blood pressure daily write blood pressure results in a log or diary take blood pressure log to all doctor appointments keep all doctor appointments   Update 1/12 - Wife report member had been doing well with managing chronic medical conditions but was hospitalized 1/5-1/11 for perianal abscess requiring I&D.  He is now back home with home health for nursing and wound care.  Wife verbalize how continuing to manage chronic medical conditions, especially diabetes, will help with wound healing.  So far, she report member's blood pressure, weights, and HR have remained stable.  Will get back into the habit or daily monitoring as he was just discharged yesterday.  He is taking antibiotics, otherwise no changes in  medications.            Plan:  Follow-up: Patient agrees to Care Plan and Follow-up. Follow-up in 1 month(s).  Valente David, RN, MSN, Jal Manager [phone]

## 2021-09-05 ENCOUNTER — Other Ambulatory Visit (HOSPITAL_COMMUNITY): Payer: Self-pay | Admitting: Cardiology

## 2021-09-05 DIAGNOSIS — R079 Chest pain, unspecified: Secondary | ICD-10-CM

## 2021-09-05 DIAGNOSIS — E785 Hyperlipidemia, unspecified: Secondary | ICD-10-CM

## 2021-09-05 DIAGNOSIS — I208 Other forms of angina pectoris: Secondary | ICD-10-CM

## 2021-09-05 DIAGNOSIS — I2510 Atherosclerotic heart disease of native coronary artery without angina pectoris: Secondary | ICD-10-CM

## 2021-09-06 LAB — AEROBIC/ANAEROBIC CULTURE W GRAM STAIN (SURGICAL/DEEP WOUND)

## 2021-09-10 ENCOUNTER — Other Ambulatory Visit (HOSPITAL_COMMUNITY): Payer: Self-pay | Admitting: *Deleted

## 2021-09-10 ENCOUNTER — Encounter: Payer: Self-pay | Admitting: Internal Medicine

## 2021-09-10 MED ORDER — NITROGLYCERIN 0.4 MG SL SUBL: SUBLINGUAL_TABLET | SUBLINGUAL | 3 refills | Status: DC

## 2021-09-11 NOTE — Progress Notes (Signed)
Cardiology Office Note:    Date:  09/12/2021   ID:  [name], DOB 08-23-1946, MRN [id number]  PCP:  [name], MD  Cardiologist:  None   Referring MD: [name], MD   Chief Complaint  Patient presents with   Coronary Artery Disease   Cardiac Valve Problem    History of Present Illness:    [name] is a 76 y.o. male with a hx of CAD referred by [facility].  (05/2021 AHF note conclusions:  Assessment/Plan: 1. S/p mitral valve repair:  Echo 2/22 showed stable mitral valve repair compared to prior echo with trivial MR, mean gradient 5 mmHg.  Echo in 8/22 showed mild-moderate stenosis with mean gradient 8 mmHg and mild MR.  - With prosthetic material, should use antibiotic prophylaxis with dental work.  2. CAD:  He has severe stenosis in a distal D1 that is not amenable to intervention.  This has been stable on last couple of caths. He has occluded RCA and LCx known from the past with SVGs to OM and PDA.  Echo in 2/22 with EF 45-50%. Cardiolite in 11/19 showed no ischemia. Echo in 8/22 with EF up to 55-60%.  No chest pain.  - Currently not on ASA, taking apixaban.  - He cannot tolerate statins with myalgias, Now on Praluent.    3. Hyperlipidemia: Myalgias with Crestor, pravastatin, and atorvastatin.  We were finally able to get him Praluent through the [place]. LDL 50 in 6/22.  4. Chronic primarily diastolic CHF:  NYHA class II symptoms, he is not volume overloaded on exam.  Echo in 8/22 with EF 55-60%.   - Continue Lasix 80 mg daily.  BMET today.  5. OSA: Continue CPAP.  6. Atrial fibrillation: Paroxysmal, in NSR after DCCV in 2/21.  He is now on amiodarone to maintain NSR given rapid recurrence of AF after DCCV in 1/21.  Probably not a good ablation candidate with size of atria.   - Continue apixaban 5 mg bid. CBC today.  - Continue amiodarone 100 mg daily. Check LFTs, TSH today.  He will need a regular eye exam.  7. 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  He sees nephrology, there is concern that he is progressing towards HD. He now has AV fistula. Recent creatinine 2.79.  8. Diabetes: He is now followed by endocrinology. 9. Atrial tachycardia: Paroxysmal.  Seen by Dr. Rayann Heman, medical management planned.  Continue amiodarone.  10. CVA: Though to be atrial fibrillation-related.  Carotid dopplers 6/22 with mild stenosis only. However, he was also noted on 8/22 echo to have a PFO.  - For now, will continue on Eliquis.  11. Aortic stenosis: Mild-moderate on 2/22 echo but minimal on 8/22 echo.   12. Enterococcal bacteremia: In 7/22.  On TEE, it was hard to differentiate between loose suture material and small vegetation.  He was treated for 6 wks with IV abx. Set for 6 month f/u))   He has no specific cardiac complaints today.  He says that the [facility] signed him up to come for general cardiology follow-up.  He has been following with Dr. Benjamine Mola in the advanced heart failure clinic for years.  States dyspnea has improved.  He is able to lay flat without dyspnea.  No lower extremity swelling, significant palpitations, or syncope.  Also followed in the [facility] Heart care EP section by Dr. Rayann Heman.  Not a Dr. Rayann Heman is gone he thought that coming to  see me with give him a substitute electrophysiologist.  In reviewing the notes by Dr. Benjamine Mola, he is basically taking care of the patient's total cardiac care.    Past Medical History:  Diagnosis Date   Allergic rhinitis    Anxiety    Atrial fibrillation (HCC)    Cancer (HCC)    hx of skin cancers    Chronic kidney disease    Coronary artery disease    a. s/p MI & CABG; b. s/p PCI Diag;  c. Cath 12/2011: LAD diff dzs/small, Diag 75% isr, 90 dist to stent (small), LCX & RCA occluded, VG->OM 40, VG->PDA patent - med rx.   COVID-19    x 3   Depression    PTSD   Diabetes mellitus    not on medications   Diastolic CHF, chronic ([place])    a. EF 55-60% by echo 2007   GERD  (gastroesophageal reflux disease)    "not anymore" " I had a lap band"   History of diverticulitis of colon    5 YRS AGO   History of pneumonia    2017   History of transient ischemic attack (TIA)    CAROTID DOPPLERS NOV 2011  0-39& BIL. 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/25/2016   Complex valvuloplasty including artificial Gore-tex neochord placement x4, plication of anterior commissure, and 26 mm Sorin Memo 3D ring annuloplasty via right mini thoracotomy approach   Shortness of breath dyspnea    with exertion   Skin cancer    Sleep apnea    cpap- see ov note in EPIC 05/12/11 for settings    Stroke [facility])    hx of   Stroke [facility])     Past Surgical History:  Procedure Laterality Date   AV FISTULA PLACEMENT Left 08/02/2018   Procedure: ARTERIOVENOUS (AV) FISTULA CREATION RADIOCEPHALIC;  Surgeon: Angelia Mould, MD;  Location: [place];  Service: Vascular;  Laterality: Left;   BLEPHAROPLASTY  [place]  2001, 2004, 2009, 2011   CARDIAC CATHETERIZATION N/A 01/23/2016   Procedure: Right/Left Heart Cath and Coronary Angiography;  Surgeon: [name], MD;  Location: [place] CV LAB;  Service: Cardiovascular;  Laterality: N/A;   CARDIOVERSION N/A 09/07/2017   Procedure: CARDIOVERSION;  Surgeon: [name], MD;  Location: [facility] ENDOSCOPY;  Service: Cardiovascular;  Laterality: N/A;   CARDIOVERSION N/A 09/13/2019   Procedure: CARDIOVERSION;  Surgeon: [name], MD;  Location: [facility] ENDOSCOPY;  Service: Cardiovascular;  Laterality: N/A;   CARDIOVERSION N/A 10/19/2019   Procedure: CARDIOVERSION;  Surgeon: [name], MD;  Location: [facility] ENDOSCOPY;  Service: Cardiovascular;  Laterality: N/A;   CATARACT EXTRACTION W/ INTRAOCULAR LENS IMPLANT Bilateral    CHONDROPLASTY  07/22/2011   Procedure: CHONDROPLASTY;  Surgeon: Dione Plover Aluisio;  Location: [place];  Service: Orthopedics;;   CIRCUMCISION  [place]-- POST CABG   W/ STENT, last cath 01/07/2012    [place]   X3  VESSEL   CORONARY ARTERY BYPASS GRAFT  1995   CORONARY STENT PLACEMENT     CYSTOSCOPY/URETEROSCOPY/HOLMIUM LASER/STENT PLACEMENT Right 01/08/2021   Procedure: CYSTOSCOPY/RETROGRADE/URETEROSCOPY/HOLMIUM LASER/STENT PLACEMENT;  Surgeon: Franchot Gallo, MD;  Location: [facility];  Service: Urology;  Laterality: Right;   INCISION AND DRAINAGE PERIRECTAL ABSCESS N/A 09/01/2021   Procedure: IRRIGATION AND DEBRIDEMENT PERIRECTAL ABSCESS;  Surgeon: Kieth Brightly Arta Bruce, MD;  Location: [facility];  Service: General;  Laterality: N/A;   IR PERC TUN PERIT CATH WO PORT S&I D[place]a [place]  03/06/2021   IR REMOVAL TUN CV CATH W/O FL  04/14/2021   KNEE ARTHROSCOPY  07/22/2011   Procedure: ARTHROSCOPY KNEE;  Surgeon: [name];  Location: [place];  Service: Orthopedics;  Laterality: Right;  WITH DEBRIDEMENT    KNEE ARTHROSCOPY W/ MENISCECTOMY  X2 IN 2002-- RIGHT KNEE   LAPAROSCOPIC GASTRIC BANDING  01-15-09   LEFT HEART CATH AND CORONARY ANGIOGRAPHY N/A 04/29/2017   Procedure: LEFT HEART CATH AND CORONARY ANGIOGRAPHY;  Surgeon: [name], MD;  Location: [place] CV LAB;  Service: Cardiovascular;  Laterality: N/A;   LEFT HEART CATHETERIZATION WITH CORONARY ANGIOGRAM N/A 09/11/2013   Procedure: LEFT HEART CATHETERIZATION WITH CORONARY ANGIOGRAM;  Surgeon: [name], MD;  Location: [facility] CATH LAB;  Service: Cardiovascular;  Laterality: N/A;   MITRAL VALVE REPAIR Right 03/25/2016   Procedure: MINIMALLY INVASIVE REOPERATION FOR MITRAL VALVE REPAIR   (MVR) with size 26 Sorin Memo 3D;  Surgeon: [name], MD;  Location: [place];  Service: Open Heart Surgery;  Laterality: Right;   MITRAL VALVE REPAIR  03/2016   RIGHT KNEE ED COMPARTMENT REPLACEMENT  2004   SYNOVECTOMY  07/22/2011   Procedure: SYNOVECTOMY;  Surgeon: Dione Plover Aluisio;  Location: [place];  Service: Orthopedics;;   TEE WITHOUT CARDIOVERSION N/A 01/23/2016   Procedure: TRANSESOPHAGEAL ECHOCARDIOGRAM (TEE);  Surgeon: [name], MD;  Location: [place];  Service: Cardiovascular;  Laterality: N/A;   TEE WITHOUT CARDIOVERSION N/A 03/25/2016   Procedure: TRANSESOPHAGEAL ECHOCARDIOGRAM (TEE);  Surgeon: [name], MD;  Location: [place];  Service: Open Heart Surgery;  Laterality: N/A;   TEE WITHOUT CARDIOVERSION N/A 03/04/2021   Procedure: TRANSESOPHAGEAL ECHOCARDIOGRAM (TEE);  Surgeon: Lelon Perla, MD;  Location: [facility] ENDOSCOPY;  Service: Cardiovascular;  Laterality: N/A;   UMBILICAL HERNIA REPAIR  01-15-09   W/ GASTRIC BANDING PROCEDURE    Current Medications: Current Meds  Medication Sig   acetaminophen (TYLENOL) 325 MG tablet Take 2 tablets (650 mg total) by mouth every 6 (six) hours as needed for mild pain (or Fever >/= 101).   Alirocumab (PRALUENT) 75 MG/ML SOAJ Inject 75 mg into the skin every 14 (fourteen) days.   allopurinol (ZYLOPRIM) 100 MG tablet Take 0.5 tablets (50 mg total) by mouth 2 (two) times a week. Please discuss dosing with your PCP based on your renal function.   amiodarone (PACERONE) 200 MG tablet Take 0.5 tablets (100 mg total) by mouth daily.   apixaban (ELIQUIS) 5 MG TABS tablet Take 1 tablet (5 mg total) by mouth 2 (two) times daily.   Ascorbic Acid (VITAMIN C WITH ROSE HIPS) 1000 MG tablet Take 1,000 mg by mouth daily.   calcitRIOL (ROCALTROL) 0.25 MCG capsule Take 0.25 mcg by mouth every other day.   Carboxymethylcellulose Sodium 1 % GEL Place 1 drop into both eyes 3 (three) times daily as needed (dry eyes).    cetirizine (ZYRTEC) 10 MG tablet Take 10 mg by mouth daily.  colchicine 0.6 MG tablet Take 0.6-1.2 mg by mouth 2 (two) times daily as needed (gout).    diclofenac Sodium (VOLTAREN) 1 % GEL Apply 2 g topically 3 (three) times daily as needed for pain.   EFUDEX 5 % cream Apply 1 application topically daily.   fluticasone (FLONASE) 50 MCG/ACT nasal spray Place 1 spray into both nostrils daily as needed for allergies or rhinitis.   furosemide (LASIX) 40 MG tablet Take 40 mg by mouth daily.   HYDROcodone-acetaminophen (NORCO/VICODIN) 5-325 MG tablet Take 1 tablet by mouth as needed.   methocarbamol (ROBAXIN) 500 MG tablet Take 500 mg by mouth 3 (three) times daily as needed for muscle spasms.   Multiple Vitamin (MULTIVITAMIN) capsule Take 1 capsule by mouth daily.   nitroGLYCERIN (NITROSTAT) 0.4 MG SL tablet 1 TAB UNDER THE TONGUE EVERY 5 MINUTES AS NEEDED FOR CHEST PAIN UP TO 3 DOSES, IF PERSIST CALL 911   ONETOUCH VERIO test strip 1 each daily.   oxyCODONE-acetaminophen (PERCOCET/ROXICET) 5-325 MG tablet Take 1-2 tablets by mouth every 4 (four) hours as needed for moderate pain.   pantoprazole (PROTONIX) 40 MG tablet Take 40 mg by mouth daily as needed (heartburn).   Semaglutide,0.25 or 0.5MG /DOS, (OZEMPIC, 0.25 OR 0.5 MG/DOSE,) 2 MG/1.5ML SOPN Inject 0.375 mLs (0.5 mg total) into the skin every Thursday. (Patient taking differently: Inject 0.5 mg into the skin once a week. Tuesday)   sildenafil (VIAGRA) 100 MG tablet Take 50 mg by mouth daily as needed for erectile dysfunction.   traZODone (DESYREL) 100 MG tablet Take 100 mg by mouth at bedtime as needed for sleep.     Allergies:   Crestor [rosuvastatin], Lipitor [atorvastatin], Pravastatin, Carvedilol, Metformin, Serotonin, Serotonin reuptake inhibitors (ssris), Zocor [simvastatin], and Codeine   Social History   Socioeconomic History   Marital status: Married    Spouse name: [name]   Number of children: 1   Years of education: Not on file    Highest education level: Master's degree (e.g., MA, MS, MEng, MEd, MSW, MBA)  Occupational History    Comment: Chief Financial Officer retired  Tobacco Use   Smoking status: Never   Smokeless tobacco: Never  Vaping Use   Vaping Use: Never used  Substance and Sexual Activity   Alcohol use: Not Currently    Alcohol/week: 2.0 standard drinks    Types: 1 Glasses of wine, 1 Shots of liquor per week    Comment: OCCASIONAL   Drug use: Never   Sexual activity: Not on file  Other Topics Concern   Not on file  Social History Narrative   Lives with wife   occas soda   Social Determinants of Health   Financial Resource Strain: Not on file  Food Insecurity: No Food Insecurity   Worried About Charity fundraiser in the Last Year: Never true   [place] in the Last Year: Never true  Transportation Needs: No Transportation Needs   Lack of Transportation (Medical): No   Lack of Transportation (Non-Medical): No  Physical Activity: Not on file  Stress: Not on file  Social Connections: Not on file     Family History: The patient's family history includes Alzheimer's disease in his mother; CVA in his father; Congestive Heart Failure in his brother; Depression in his father; Diabetes in his sister; Heart disease in his brother, father, and sister; Heart failure in his sister; Hypertension in his brother and father; Other in his mother; Stroke in his sister.   ROS:  Please see the history of present illness.    Having drainage from recent rectal abscess resection.  All other systems reviewed and are negative.  EKGs/Labs/Other Studies Reviewed:    The following studies were reviewed today: No recent imaging data not already studied and evaluated by Dr. Rayann Heman.  2D Doppler echocardiogram 04/11/2021: IMPRESSIONS    1. Left ventricular ejection fraction, by estimation, is 55 to 60%. The  left ventricle has normal function. The left ventricle has no regional  wall motion  abnormalities. There is moderate asymmetric left ventricular  hypertrophy of the basal-septal  segment. Left ventricular diastolic parameters are indeterminate.   2. Right ventricular systolic function is normal. The right ventricular  size is normal. There is normal pulmonary artery systolic pressure.   3. Left atrial size was severely dilated.   4. Mean mitral valve gradient is 8 mmHg compared with 12 mmHg 02/2021.  There is a filamentous, mobile density on the left atrial side of the  mitral annulus. Previously noted on TEE 02/2021. No significant change.  Likely an annuloplasty suture. The mitral  valve has been repaired/replaced. Mild mitral valve regurgitation. Mild to  moderate mitral stenosis. There is a 26 mm prosthetic annuloplasty ring  present in the mitral position. Procedure Date: 03/25/2016.   5. The aortic valve is tricuspid. There is mild calcification of the  aortic valve. There is mild thickening of the aortic valve. Aortic valve  regurgitation is not visualized. No aortic stenosis is present.   6. Aortic dilatation noted. There is mild dilatation of the ascending  aorta, measuring 39 mm.   7. The inferior vena cava is normal in size with greater than 50%  respiratory variability, suggesting right atrial pressure of 3 mmHg.   8. Evidence of atrial level shunting detected by color flow Doppler.  There is a large patent foramen ovale with predominantly left to right  shunting across the atrial septum.    EKG:  EKG performed June 17, 2021 reveals normal sinus rhythm/sinus bradycardia, left anterior hemiblock, right bundle branch block.  No acute change.  Recent Labs: 01/10/2021: Magnesium 2.0 02/26/2021: B Natriuretic Peptide 762.7 06/17/2021: TSH 1.610 09/02/2021: ALT 14; BUN 35; Creatinine, Ser 3.21; Hemoglobin 9.4; Platelets 175; Potassium 4.3; Sodium 136  Recent Lipid Panel    Component Value Date/Time   CHOL 188 06/17/2021 1534   CHOL 136 10/14/2016 1230   TRIG  118 06/17/2021 1534   HDL 60 06/17/2021 1534   HDL 36 (L) 10/14/2016 1230   CHOLHDL 3.1 06/17/2021 1534   VLDL 24 06/17/2021 1534   LDLCALC 104 (H) 06/17/2021 1534   LDLCALC 76 10/14/2016 1230    Physical Exam:    VS:  BP 118/64    Pulse (!) 48    Ht 5' 9.5" (1.765 m)    Wt 192 lb 6.4 oz (87.3 kg)    SpO2 100%    BMI 28.01 kg/m     Wt Readings from Last 3 Encounters:  09/12/21 192 lb 6.4 oz (87.3 kg)  08/28/21 190 lb (86.2 kg)  06/19/21 186 lb 12.8 oz (84.7 kg)     GEN: Healthy appearing. No acute distress HEENT: Normal NECK: No JVD. LYMPHATICS: No lymphadenopathy CARDIAC: Soft right upper sternal systolic diamond-shaped murmur.  Soft left lower sternal systolic murmur. RRR no gallop, or edema. VASCULAR:  Normal Pulses. No bruits. RESPIRATORY:  Clear to auscultation without rales, wheezing or rhonchi  ABDOMEN: Soft, non-tender, non-distended, No pulsatile mass, MUSCULOSKELETAL: No deformity  SKIN: Warm  and dry NEUROLOGIC:  Alert and oriented x 3 PSYCHIATRIC:  Normal affect   ASSESSMENT:    1. Atherosclerosis of native coronary artery of native heart without angina pectoris   2. Chronic systolic congestive heart failure (HCC)   3. Pure hypercholesterolemia   4. S/P mitral valve repair   5. On amiodarone therapy   6. Iron deficiency anemia, unspecified iron deficiency anemia type   7. Paroxysmal atrial fibrillation (HCC)   8. OSA on CPAP   9. Chronic kidney disease (CKD), stage IV (severe) ([place])   10. Type 2 diabetes mellitus with stage 4 chronic kidney disease, with long-term current use of insulin ([place])   11. Cerebrovascular accident (CVA), unspecified mechanism ([place])   12. Aortic valve stenosis, etiology of cardiac valve disease unspecified    PLAN:    In order of problems listed above:  Secondary prevention discussed LV function has recovered.  No heart failure therapy. Continue aggressive lowering with Praluent Continue to follow the mitral valve.   Auscultation is benign. Continue amiodarone 100 mg/day.  Needs liver and thyroid as well as chest x-ray follow-up serologic Did not discuss anemia but on Eliquis needs to be followed closely. Rhythm control on low-dose amiodarone Encourage compliance Significant but relatively stable.  Significant morbidity/mortality in [place] or over time. No new neurological complaints. Soft right upper sternal systolic murmur as well.  Echo did not reveal any significant aortic stenosis.   Not sure why he is here to see me.  Seems to have redundancy in care.  He is being managed by Dr. Aundra Dubin and is stable.  Unless Dr. Benjamine Mola wants the patient followed by me I would leave things as they are.  We will be available to see the patient as needed if needed.   Medication Adjustments/Labs and Tests Ordered: Current medicines are reviewed at length with the patient today.  Concerns regarding medicines are outlined above.  No orders of the defined types were placed in this encounter.  No orders of the defined types were placed in this encounter.   Patient Instructions  Medication Instructions:  Your physician recommends that you continue on your current medications as directed. Please refer to the Current Medication list given to you today.  *If you need a refill on your cardiac medications before your next appointment, please call your pharmacy*   Lab Work: None If you have labs (blood work) drawn today and your tests are completely normal, you will receive your results only by: [place] (if you have MyChart) OR A paper copy in the mail If you have any lab test that is abnormal or we need to change your treatment, we will call you to review the results.   Testing/Procedures: None   Follow-Up: At [facility], you and your health needs are our priority.  As part of our continuing mission to provide you with exceptional heart care, we have created designated Provider Care Teams.  These  Care Teams include your primary Cardiologist (physician) and Advanced Practice Providers (APPs -  Physician Assistants and Nurse Practitioners) who all work together to provide you with the care you need, when you need it.  We recommend signing up for the patient portal called "MyChart".  Sign up information is provided on this After Visit Summary.  MyChart is used to connect with patients for Virtual Visits (Telemedicine).  Patients are able to view lab/test results, encounter notes, upcoming appointments, etc.  Non-urgent messages can be sent to your provider as well.  To learn more about what you can do with MyChart, go to [email or web address].    Your next appointment:   As needed  The format for your next appointment:   In Person  Provider:   [facility]. Blenda Bridegroom, MD    Other Instructions     Signed, Sinclair Grooms, MD  09/12/2021 2:46 PM    [place]

## 2021-09-12 ENCOUNTER — Encounter: Payer: Self-pay | Admitting: Interventional Cardiology

## 2021-09-12 ENCOUNTER — Ambulatory Visit (INDEPENDENT_AMBULATORY_CARE_PROVIDER_SITE_OTHER): Payer: No Typology Code available for payment source | Admitting: Interventional Cardiology

## 2021-09-12 ENCOUNTER — Other Ambulatory Visit: Payer: Self-pay

## 2021-09-12 VITALS — BP 118/64 | HR 48 | Ht 69.5 in | Wt 192.4 lb

## 2021-09-12 DIAGNOSIS — Z794 Long term (current) use of insulin: Secondary | ICD-10-CM

## 2021-09-12 DIAGNOSIS — E785 Hyperlipidemia, unspecified: Secondary | ICD-10-CM

## 2021-09-12 DIAGNOSIS — I639 Cerebral infarction, unspecified: Secondary | ICD-10-CM

## 2021-09-12 DIAGNOSIS — Z9889 Other specified postprocedural states: Secondary | ICD-10-CM

## 2021-09-12 DIAGNOSIS — E7800 Pure hypercholesterolemia, unspecified: Secondary | ICD-10-CM | POA: Diagnosis not present

## 2021-09-12 DIAGNOSIS — I2510 Atherosclerotic heart disease of native coronary artery without angina pectoris: Secondary | ICD-10-CM | POA: Diagnosis not present

## 2021-09-12 DIAGNOSIS — M109 Gout, unspecified: Secondary | ICD-10-CM | POA: Diagnosis not present

## 2021-09-12 DIAGNOSIS — I509 Heart failure, unspecified: Secondary | ICD-10-CM | POA: Diagnosis not present

## 2021-09-12 DIAGNOSIS — I480 Paroxysmal atrial fibrillation: Secondary | ICD-10-CM

## 2021-09-12 DIAGNOSIS — E1122 Type 2 diabetes mellitus with diabetic chronic kidney disease: Secondary | ICD-10-CM

## 2021-09-12 DIAGNOSIS — I5022 Chronic systolic (congestive) heart failure: Secondary | ICD-10-CM | POA: Diagnosis not present

## 2021-09-12 DIAGNOSIS — D509 Iron deficiency anemia, unspecified: Secondary | ICD-10-CM

## 2021-09-12 DIAGNOSIS — Z79899 Other long term (current) drug therapy: Secondary | ICD-10-CM

## 2021-09-12 DIAGNOSIS — G4733 Obstructive sleep apnea (adult) (pediatric): Secondary | ICD-10-CM

## 2021-09-12 DIAGNOSIS — I350 Nonrheumatic aortic (valve) stenosis: Secondary | ICD-10-CM

## 2021-09-12 DIAGNOSIS — N184 Chronic kidney disease, stage 4 (severe): Secondary | ICD-10-CM

## 2021-09-12 DIAGNOSIS — L03317 Cellulitis of buttock: Secondary | ICD-10-CM | POA: Diagnosis not present

## 2021-09-12 DIAGNOSIS — A419 Sepsis, unspecified organism: Secondary | ICD-10-CM | POA: Diagnosis not present

## 2021-09-12 DIAGNOSIS — I482 Chronic atrial fibrillation, unspecified: Secondary | ICD-10-CM | POA: Diagnosis not present

## 2021-09-12 DIAGNOSIS — Z9989 Dependence on other enabling machines and devices: Secondary | ICD-10-CM

## 2021-09-12 NOTE — Patient Instructions (Signed)
Medication Instructions:  Your physician recommends that you continue on your current medications as directed. Please refer to the Current Medication list given to you today.  *If you need a refill on your cardiac medications before your next appointment, please call your pharmacy*   Lab Work: None If you have labs (blood work) drawn today and your tests are completely normal, you will receive your results only by: [place] (if you have MyChart) OR A paper copy in the mail If you have any lab test that is abnormal or we need to change your treatment, we will call you to review the results.   Testing/Procedures: None   Follow-Up: At [facility], you and your health needs are our priority.  As part of our continuing mission to provide you with exceptional heart care, we have created designated Provider Care Teams.  These Care Teams include your primary Cardiologist (physician) and Advanced Practice Providers (APPs -  Physician Assistants and Nurse Practitioners) who all work together to provide you with the care you need, when you need it.  We recommend signing up for the patient portal called "MyChart".  Sign up information is provided on this After Visit Summary.  MyChart is used to connect with patients for Virtual Visits (Telemedicine).  Patients are able to view lab/test results, encounter notes, upcoming appointments, etc.  Non-urgent messages can be sent to your provider as well.   To learn more about what you can do with MyChart, go to [email or web address].    Your next appointment:   As needed  The format for your next appointment:   In Person  Provider:   [facility]. Blenda Bridegroom, MD    Other Instructions

## 2021-09-18 DIAGNOSIS — E1122 Type 2 diabetes mellitus with diabetic chronic kidney disease: Secondary | ICD-10-CM | POA: Diagnosis not present

## 2021-09-18 DIAGNOSIS — L03317 Cellulitis of buttock: Secondary | ICD-10-CM | POA: Diagnosis not present

## 2021-09-18 DIAGNOSIS — I482 Chronic atrial fibrillation, unspecified: Secondary | ICD-10-CM | POA: Diagnosis not present

## 2021-09-18 DIAGNOSIS — A419 Sepsis, unspecified organism: Secondary | ICD-10-CM | POA: Diagnosis not present

## 2021-09-18 DIAGNOSIS — I509 Heart failure, unspecified: Secondary | ICD-10-CM | POA: Diagnosis not present

## 2021-09-18 DIAGNOSIS — M109 Gout, unspecified: Secondary | ICD-10-CM | POA: Diagnosis not present

## 2021-09-24 DIAGNOSIS — I10 Essential (primary) hypertension: Secondary | ICD-10-CM | POA: Diagnosis not present

## 2021-09-24 DIAGNOSIS — I482 Chronic atrial fibrillation, unspecified: Secondary | ICD-10-CM | POA: Diagnosis not present

## 2021-09-24 DIAGNOSIS — E1122 Type 2 diabetes mellitus with diabetic chronic kidney disease: Secondary | ICD-10-CM | POA: Diagnosis not present

## 2021-09-24 DIAGNOSIS — A419 Sepsis, unspecified organism: Secondary | ICD-10-CM | POA: Diagnosis not present

## 2021-09-24 DIAGNOSIS — E559 Vitamin D deficiency, unspecified: Secondary | ICD-10-CM | POA: Diagnosis not present

## 2021-09-24 DIAGNOSIS — M109 Gout, unspecified: Secondary | ICD-10-CM | POA: Diagnosis not present

## 2021-09-24 DIAGNOSIS — I509 Heart failure, unspecified: Secondary | ICD-10-CM | POA: Diagnosis not present

## 2021-09-24 DIAGNOSIS — E7800 Pure hypercholesterolemia, unspecified: Secondary | ICD-10-CM | POA: Diagnosis not present

## 2021-09-24 DIAGNOSIS — Z9884 Bariatric surgery status: Secondary | ICD-10-CM | POA: Diagnosis not present

## 2021-09-24 DIAGNOSIS — L03317 Cellulitis of buttock: Secondary | ICD-10-CM | POA: Diagnosis not present

## 2021-09-24 DIAGNOSIS — K909 Intestinal malabsorption, unspecified: Secondary | ICD-10-CM | POA: Diagnosis not present

## 2021-09-29 DIAGNOSIS — Z125 Encounter for screening for malignant neoplasm of prostate: Secondary | ICD-10-CM | POA: Diagnosis not present

## 2021-09-29 DIAGNOSIS — E7800 Pure hypercholesterolemia, unspecified: Secondary | ICD-10-CM | POA: Diagnosis not present

## 2021-09-29 DIAGNOSIS — I69359 Hemiplegia and hemiparesis following cerebral infarction affecting unspecified side: Secondary | ICD-10-CM | POA: Diagnosis not present

## 2021-09-29 DIAGNOSIS — Z0000 Encounter for general adult medical examination without abnormal findings: Secondary | ICD-10-CM | POA: Diagnosis not present

## 2021-09-29 DIAGNOSIS — I10 Essential (primary) hypertension: Secondary | ICD-10-CM | POA: Diagnosis not present

## 2021-09-29 DIAGNOSIS — E1122 Type 2 diabetes mellitus with diabetic chronic kidney disease: Secondary | ICD-10-CM | POA: Diagnosis not present

## 2021-09-29 DIAGNOSIS — N189 Chronic kidney disease, unspecified: Secondary | ICD-10-CM | POA: Diagnosis not present

## 2021-09-29 DIAGNOSIS — E291 Testicular hypofunction: Secondary | ICD-10-CM | POA: Diagnosis not present

## 2021-09-29 DIAGNOSIS — E559 Vitamin D deficiency, unspecified: Secondary | ICD-10-CM | POA: Diagnosis not present

## 2021-10-03 ENCOUNTER — Other Ambulatory Visit: Payer: Self-pay | Admitting: *Deleted

## 2021-10-03 NOTE — Patient Instructions (Signed)
Visit Information  Thank you for taking time to visit with me today. Please don't hesitate to contact me if I can be of assistance to you before our next scheduled telephone appointment.  Following are the goals we discussed today:  Continue daily self monitoring and notify provider with consistent abnormal readings.   Our next appointment is by telephone in 1 month  The patient verbalized understanding of instructions, educational materials, and care plan provided today and agreed to receive a mailed copy of patient instructions, educational materials, and care plan.   The patient has been provided with contact information for the care management team and has been advised to call with any health related questions or concerns.   Valente David, RN, MSN, [place] Manager [phone]

## 2021-10-03 NOTE — Patient Outreach (Signed)
[facility] Care Management Telephonic RN Care Manager Note   10/03/2021 Name:  [name] MRN:  [id number] DOB:  1946/01/20  Summary: [name] call placed to member, successful.  Denies any urgent concerns, encouraged to contact this care manager with questions.   Recommendations/Changes made from today's visit: Continue daily self monitoring of blood pressure, heart rate, weights, and blood sugar.  Follow low sodium and diabetic diet.   Subjective: [name] is an 76 y.o. year old male who is a primary patient of [name], MD. The care management team was consulted for assistance with care management and/or care coordination needs.    Telephonic RN Care Manager completed Telephone Visit today.  Objective:   Medications Reviewed Today     Reviewed by Belva Crome, MD (Physician) on 09/12/21 at [place] List Status: <None>   Medication Order Taking? Sig Documenting Provider Last Dose Status Informant  acetaminophen (TYLENOL) 325 MG tablet [id number] Yes Take 2 tablets (650 mg total) by mouth every 6 (six) hours as needed for mild pain (or Fever >/= 101). [name], MD Taking Active Multiple Informants  Alirocumab (PRALUENT) 75 MG/ML Darden Palmer [id number] Yes Inject 75 mg into the skin every 14 (fourteen) days. [provider] Taking Active Multiple Informants  allopurinol (ZYLOPRIM) 100 MG tablet [id number] Yes Take 0.5 tablets (50 mg total) by mouth 2 (two) times a week. Please discuss dosing with your PCP based on your renal function. [name], MD Taking Active Multiple Informants  amiodarone (PACERONE) 200 MG tablet [id number] Yes Take 0.5 tablets (100 mg total) by mouth daily. Chester, [name], FNP Taking Active Multiple Informants  apixaban (ELIQUIS) 5 MG TABS tablet [id number] Yes Take 1 tablet (5 mg total) by mouth 2 (two) times daily. [name], MD Taking Active   Ascorbic Acid (VITAMIN C WITH ROSE HIPS) 1000  MG tablet [id number] Yes Take 1,000 mg by mouth daily. [provider] Taking Active Multiple Informants  calcitRIOL (ROCALTROL) 0.25 MCG capsule [id number] Yes Take 0.25 mcg by mouth every other day. [provider] Taking Active Multiple Informants  Carboxymethylcellulose Sodium 1 % GEL [id number] Yes Place 1 drop into both eyes 3 (three) times daily as needed (dry eyes). [provider] Taking Active Multiple Informants  cetirizine (ZYRTEC) 10 MG tablet [id number] Yes Take 10 mg by mouth daily. [provider] Taking Active Multiple Informants  colchicine 0.6 MG tablet [id number] Yes Take 0.6-1.2 mg by mouth 2 (two) times daily as needed (gout).  [provider] Taking Active Multiple Informants  diclofenac Sodium (VOLTAREN) 1 % GEL [id number] Yes Apply 2 g topically 3 (three) times daily as needed for pain. [provider] Taking Active Multiple Informants  EFUDEX 5 % cream [id number] Yes Apply 1 application topically daily. [provider] Taking Active Multiple Informants  fluticasone (FLONASE) 50 MCG/ACT nasal spray [id number] Yes Place 1 spray into both nostrils daily as needed for allergies or rhinitis. [provider] Taking Active Multiple Informants  furosemide (LASIX) 40 MG tablet [id number] Yes Take 40 mg by mouth daily. [provider] Taking Active Multiple Informants  HYDROcodone-acetaminophen (NORCO/VICODIN) 5-325 MG tablet [id number] Yes Take 1 tablet by mouth as needed. [provider] Taking Active   methocarbamol (ROBAXIN) 500 MG tablet [id number] Yes Take 500 mg by mouth 3 (three) times daily as needed for muscle spasms. [provider] Taking Active Multiple Informants  Multiple Vitamin (MULTIVITAMIN) capsule [id number] Yes Take 1  capsule by mouth daily. [provider] Taking Active Multiple Informants  nitroGLYCERIN (NITROSTAT) 0.4 MG SL tablet [id number] Yes 1 TAB UNDER THE TONGUE  EVERY 5 MINUTES AS NEEDED FOR CHEST PAIN UP TO 3 DOSES, IF PERSIST CALL 911 [name], MD Taking Active   [facility] VERIO test strip [id number] Yes 1 each daily. [provider] Taking Active Multiple Informants  oxyCODONE-acetaminophen (PERCOCET/ROXICET) 5-325 MG tablet [id number] Yes Take 1-2 tablets by mouth every 4 (four) hours as needed for moderate pain. [name], MD Taking Active   pantoprazole (PROTONIX) 40 MG tablet [id number] Yes Take 40 mg by mouth daily as needed (heartburn). [provider] Taking Active Multiple Informants  Semaglutide,0.25 or 0.5MG/DOS, (OZEMPIC, 0.25 OR 0.5 MG/DOSE,) 2 MG/1.5ML SOPN [id number] Yes Inject 0.375 mLs (0.5 mg total) into the skin every Thursday.  Patient taking differently: Inject 0.5 mg into the skin once a week. Tuesday   Philemon Kingdom, MD Taking Active Multiple Informants  sildenafil (VIAGRA) 100 MG tablet [id number] Yes Take 50 mg by mouth daily as needed for erectile dysfunction. [provider] Taking Active Multiple Informants  traZODone (DESYREL) 100 MG tablet [id number] Yes Take 100 mg by mouth at bedtime as needed for sleep. [provider] Taking Active Multiple Informants  Med List Note Annamaria Boots, [place], MD 04/13/11 2118): CPAP             SDOH:  (Social Determinants of Health) assessments and interventions performed:     Care Plan  Review of patient past medical history, allergies, medications, health status, including review of consultants reports, laboratory and other test data, was performed as part of comprehensive evaluation for care management services.   Care Plan : General Plan of Care (Adult)  Updates made by Valente David, RN since 10/03/2021 12:00 AM     Problem: Management of chronic medical conditions (HTN, CHF, A-fib)   Priority: High     Long-Range Goal: Member will be able to adequately verbalize plan of care for management of chronic medical conditions    Start Date: 08/07/2021  Expected End Date: 02/03/2022  This Visit's Progress: On track  Recent Progress: On track  Priority: High  Note:   Current Barriers:  Chronic Disease Management support and education needs related to Atrial Fibrillation, CHF, and HTN  RNCM Clinical Goal(s):  Patient will verbalize understanding of plan for management of Atrial Fibrillation, CHF, and HTN as evidenced by verbalizing plan of care take all medications exactly as prescribed and will call provider for medication related questions as evidenced by member reported adherence    attend all scheduled medical appointments: Neurology, PCP, cardiology as evidenced by Member reported adherence        continue to work with RN Care Manager and/or Social Worker to address care management and care coordination needs related to Atrial Fibrillation, CHF, and HTN as evidenced by adherence to CM Team Scheduled appointments     through collaboration with RN Care manager, provider, and care team.   Interventions: Inter-disciplinary care team collaboration (see longitudinal plan of care) Evaluation of current treatment plan related to  self management and patient's adherence to plan as established by provider   A-fib:  (Status: Goal on Track (progressing): YES.) Long Term Goal  Counseled on increased risk of stroke due to Afib and benefits of anticoagulation for stroke prevention           Reviewed importance of adherence to anticoagulant exactly as prescribed Afib action plan reviewed Screening for signs  and symptoms of depression related to chronic disease state  Heart Failure Interventions:  (Status: Goal on Track (progressing): YES.)  Long Term Goal  Provided education on low sodium diet Reviewed Heart Failure Action Plan in depth and provided written copy Discussed importance of daily weight and advised patient to weigh and record daily  Hypertension: (Status: Goal on Track (progressing): YES.) Last practice  recorded BP readings:  BP Readings from Last 3 Encounters:  06/19/21 110/62  06/17/21 122/60  05/21/21 125/71  Most recent eGFR/CrCl: No results found for: EGFR  No components found for: CRCL  Provided education to patient re: stroke prevention, s/s of heart attack and stroke; Reviewed medications with patient and discussed importance of compliance;  Discussed plans with patient for ongoing care management follow up and provided patient with direct contact information for care management team;  Patient Goals/Self-Care Activities: Take medications as prescribed   Attend all scheduled provider appointments Call provider office for new concerns or questions  call office if I gain more than 2 pounds in one day or 5 pounds in one week keep legs up while sitting track weight in diary use salt in moderation watch for swelling in feet, ankles and legs every day - check pulse (heart) rate once a day - take medicine as prescribed check blood pressure daily write blood pressure results in a log or diary take blood pressure log to all doctor appointments keep all doctor appointments   Update 1/12 - Wife report member had been doing well with managing chronic medical conditions but was hospitalized 1/5-1/11 for perianal abscess requiring I&D.  He is now back home with home health for nursing and wound care.  Wife verbalize how continuing to manage chronic medical conditions, especially diabetes, will help with wound healing.  So far, she report member's blood pressure, weights, and HR have remained stable.  Will get back into the habit or daily monitoring as he was just discharged yesterday.  He is taking antibiotics, otherwise no changes in medications.     Update 2/10 - Member report he is doing much better.  Wound is healed, released from home health last week.  He was also seen by PCP last Monday, denies any major concerns, will see again in 3 months.  Continues to monitor blood pressure,  weights, and blood sugar daily.  Weight range 189-190, denies swelling or shortness of breath.  Blood sugar yesterday was 118, has not checked today yet.  Will follow up with endocrinology on 3/9.  Recent visit with cardiology on 1/20, state he will no longer have to visit regular cardiologist as he is being seen at the heart failure clinic.  Last office visit there was on October with instructions to follow up in 6 months.  Next visit not on schedule, call placed to HF clinic, requested to have call directly to member to schedule next visit.         Plan:  Telephone follow up appointment with care management team member scheduled for:  1 month The patient has been provided with contact information for the care management team and has been advised to call with any health related questions or concerns.   Valente David, RN, MSN, [place] Manager [phone]

## 2021-10-15 DIAGNOSIS — J019 Acute sinusitis, unspecified: Secondary | ICD-10-CM | POA: Diagnosis not present

## 2021-10-15 DIAGNOSIS — R059 Cough, unspecified: Secondary | ICD-10-CM | POA: Diagnosis not present

## 2021-10-30 ENCOUNTER — Encounter: Payer: Self-pay | Admitting: Internal Medicine

## 2021-10-30 ENCOUNTER — Ambulatory Visit (INDEPENDENT_AMBULATORY_CARE_PROVIDER_SITE_OTHER): Payer: No Typology Code available for payment source | Admitting: Internal Medicine

## 2021-10-30 ENCOUNTER — Other Ambulatory Visit: Payer: Self-pay

## 2021-10-30 VITALS — BP 120/84 | HR 86 | Ht 69.0 in | Wt 198.6 lb

## 2021-10-30 DIAGNOSIS — I208 Other forms of angina pectoris: Secondary | ICD-10-CM | POA: Diagnosis not present

## 2021-10-30 DIAGNOSIS — E1159 Type 2 diabetes mellitus with other circulatory complications: Secondary | ICD-10-CM | POA: Diagnosis not present

## 2021-10-30 DIAGNOSIS — E782 Mixed hyperlipidemia: Secondary | ICD-10-CM

## 2021-10-30 LAB — POCT GLYCOSYLATED HEMOGLOBIN (HGB A1C): Hemoglobin A1C: 6.3 % — AB (ref 4.0–5.6)

## 2021-10-30 NOTE — Progress Notes (Signed)
Patient ID: [name], male   DOB: 1945/10/19, 76 y.o.   MRN: [id number]   This visit occurred during the SARS-CoV-2 public health emergency.  Safety protocols were in place, including screening questions prior to the visit, additional usage of staff PPE, and extensive cleaning of exam room while observing appropriate contact time as indicated for disinfecting solutions.   HPI: [name] is a 76 y.o.-year-old male, initially referred by his nephrologist, Dr. 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 fib, CKD stage V, cerebrovascular disease, Aortic atherosclerosis, s/p TIA, ED).  Last visit 5 months ago. He is here with his wife who offers part of the history especially regarding blood sugars, medication doses, and past medical history.  Interim history: He has a stroke 12/2020 - weak R side of body.  He was in physical therapy, now finished.  He was admitted with perineal cellulitis in 08/2021.  He had a perirectal abscess I&D 09/01/2021. He does have increased urination, but improved; no blurry vision, nausea, chest pain but has chrnic shortness of breath.  Reviewed HbA1c levels: 09/24/2021: HbA1c 5.8% Lab Results  Component Value Date   HGBA1C 6.4 (A) 06/19/2021   HGBA1C 6.2 (H) 02/02/2021   HGBA1C 6.3 (H) 01/07/2021   HGBA1C 6.0 (H) 10/05/2020   HGBA1C 5.9 (A) 01/29/2020   HGBA1C 7.1 (A) 09/25/2019   HGBA1C 7.1 (A) 05/23/2019   HGBA1C 8.5 (A) 02/02/2019   HGBA1C 7.2 (H) 03/23/2016   HGBA1C 6.5 (H) 08/20/2012  01/11/2019: HbA1c 8.3% 10/04/2018: HbA1c 8.2%  He is currently on: - Ozempic 0.5 mg weekly >> stopped 04/2020 >> restarted 05/2020 - through the [place]. - (Glipizide 2.5 to 5 mg as needed before a  larger dinner) He came off metformin due to worsening kidney function. We stopped glipizide 09/2020 due to good control.  Pt checks his sugars once a  day: - am:  104-155 >> 77, 110-135, 140, 155 >> 80-135, 140 - 2h after b'fast: n/c >> 207 >> 90, 110 >> n/c - before lunch: n/c >> 102-130 - 2h after lunch: n/c >> 120-130 >> n/c >> 134, 165 >> n/c - before dinner: 129 >> n/c >> 86 >> n/c - 2h after dinner: n/c >> 190 >> n/c  - bedtime: n/c >> 77-122 >> n/c - nighttime: n/c Lowest sugar was 69 >> 104 >> 77 >> 80; he has hypoglycemia awareness in the 70s. Highest sugar was 299 >> ... 224 >> 170 >> 155 >> 140.  Pt's meals are: - Breakfast: egg and bacon (not lately).   - Brunch: meat + veggies - Dinner: same as lunch - Snacks: crackers, popcorn   -He has stage V CKD-sees nephrology-did not have to start dialysis yet but has a fistula placed; last BUN/creatinine:  09/24/2021: 45/3.21, GFR 19, glucose 120 Lab Results  Component Value Date   BUN 35 (H) 09/02/2021   BUN 30 (H) 09/01/2021   CREATININE 3.21 (H) 09/02/2021   CREATININE 3.19 (H) 09/01/2021  01/11/2019: 45//3.79, GFR 16, glucose 149, ACR 52.5 10/04/2018: 32/3.78, GFR 15, glucose 184 He is not on ACE inhibitor/ARB.  -+ HL; last set of lipids: 09/24/2021: 142/100/59/65 Lab Results  Component Value Date   CHOL 188 06/17/2021   HDL 60 06/17/2021   LDLCALC 104 (H) 06/17/2021   TRIG 118 06/17/2021   CHOLHDL 3.1 06/17/2021  He developed generalized aches and pains from pravastatin.  Now also  off Zetia.  He is currently on Praluent.  - last eye exam was in 04-12/2020: [facility] [place] [facility]).   - no numbness and tingling in his feet.  Pt has FH of DM in sister and brother.  He also has hypertension. He has a stroke 12/2020 - weak R side of body, but improving. He was in PT. he started to see neurology. He was admitted 02/01/2021 for chest pain. He had AKI. He has a history of lap band surgery 2010. Lost from 373 lbs to 215-220 lbs. 01/23/2021: B12 vitamin 733, TSH 1.83  ROS: C+ see HPI  I reviewed pt's medications, allergies, PMH, social hx, family hx, and changes were  documented in the history of present illness. Otherwise, unchanged from my initial visit note.  Past Medical History:  Diagnosis Date   Allergic rhinitis    Anxiety    Atrial fibrillation (HCC)    Cancer (HCC)    hx of skin cancers    Chronic kidney disease    Coronary artery disease    a. s/p MI & CABG; b. s/p PCI Diag;  c. Cath 12/2011: LAD diff dzs/small, Diag 75% isr, 90 dist to stent (small), LCX & RCA occluded, VG->OM 40, VG->PDA patent - med rx.   COVID-19    x 3   Depression    PTSD   Diabetes mellitus    not on medications   Diastolic CHF, chronic ([place])    a. EF 55-60% by echo 2007   GERD (gastroesophageal reflux disease)    "not anymore" " I had a lap band"   History of diverticulitis of colon    5 YRS AGO   History of pneumonia    2017   History of transient ischemic attack (TIA)    CAROTID DOPPLERS NOV 2011  0-39& BIL. STENOSIS   Hyperlipidemia    Hypertension    Kidney failure    Mitral regurgitation    Myocardial infarction 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   Complex valvuloplasty including artificial Gore-tex neochord placement x4, plication of anterior commissure, and 26 mm Sorin Memo 3D ring annuloplasty via right mini thoracotomy approach   Shortness of breath dyspnea    with exertion   Skin cancer    Sleep apnea    cpap- see ov note in EPIC 05/12/11 for settings    Stroke [facility])    hx of   Stroke [facility])    Past Surgical History:  Procedure Laterality Date   AV FISTULA PLACEMENT Left 08/02/2018   Procedure: ARTERIOVENOUS (AV) FISTULA CREATION RADIOCEPHALIC;  Surgeon: Angelia Mould, MD;  Location: [place];  Service: Vascular;  Laterality: Left;   BLEPHAROPLASTY  [place]  2001,  2004, 2009, 2011   CARDIAC CATHETERIZATION N/A 01/23/2016   Procedure: Right/Left Heart Cath and Coronary Angiography;  Surgeon: [name], MD;  Location: [place] CV LAB;  Service: Cardiovascular;  Laterality: N/A;   CARDIOVERSION N/A 09/07/2017   Procedure: CARDIOVERSION;  Surgeon: [name], MD;  Location: [facility] ENDOSCOPY;  Service: Cardiovascular;  Laterality: N/A;   CARDIOVERSION N/A 09/13/2019   Procedure: CARDIOVERSION;  Surgeon: [name], MD;  Location: [place];  Service:  Cardiovascular;  Laterality: N/A;   CARDIOVERSION N/A 10/19/2019   Procedure: CARDIOVERSION;  Surgeon: [name], MD;  Location: [facility] ENDOSCOPY;  Service: Cardiovascular;  Laterality: N/A;   CATARACT EXTRACTION W/ INTRAOCULAR LENS IMPLANT Bilateral    CHONDROPLASTY  07/22/2011   Procedure: CHONDROPLASTY;  Surgeon: Dione Plover Aluisio;  Location: [place];  Service: Orthopedics;;   CIRCUMCISION  [place]-- POST CABG   W/ STENT, last cath 01/07/2012    CORONARY ARTERY BYPASS GRAFT  1995   X3 VESSEL   CORONARY ARTERY BYPASS GRAFT  1995   CORONARY STENT PLACEMENT     CYSTOSCOPY/URETEROSCOPY/HOLMIUM LASER/STENT PLACEMENT Right 01/08/2021   Procedure: CYSTOSCOPY/RETROGRADE/URETEROSCOPY/HOLMIUM LASER/STENT PLACEMENT;  Surgeon: Franchot Gallo, MD;  Location: [facility];  Service: Urology;  Laterality: Right;   INCISION AND DRAINAGE PERIRECTAL ABSCESS N/A 09/01/2021   Procedure: IRRIGATION AND DEBRIDEMENT PERIRECTAL ABSCESS;  Surgeon: Kieth Brightly Arta Bruce, MD;  Location: [facility];  Service: General;  Laterality: N/A;   IR PERC TUN PERIT CATH WO PORT S&I Dartha [place]  03/06/2021   IR REMOVAL TUN CV CATH W/O FL  04/14/2021   KNEE ARTHROSCOPY  07/22/2011   Procedure: ARTHROSCOPY KNEE;  Surgeon: [name];  Location: [place];  Service: Orthopedics;  Laterality: Right;  WITH DEBRIDEMENT    KNEE ARTHROSCOPY W/ MENISCECTOMY  X2 IN  2002-- RIGHT KNEE   LAPAROSCOPIC GASTRIC BANDING  01-15-09   LEFT HEART CATH AND CORONARY ANGIOGRAPHY N/A 04/29/2017   Procedure: LEFT HEART CATH AND CORONARY ANGIOGRAPHY;  Surgeon: [name], MD;  Location: [place] CV LAB;  Service: Cardiovascular;  Laterality: N/A;   LEFT HEART CATHETERIZATION WITH CORONARY ANGIOGRAM N/A 09/11/2013   Procedure: LEFT HEART CATHETERIZATION WITH CORONARY ANGIOGRAM;  Surgeon: [name], MD;  [facility] CATH LAB;  Service: Cardiovascular;  Laterality: N/A;   MITRAL VALVE REPAIR Right 03/25/2016   Procedure: MINIMALLY INVASIVE REOPERATION FOR MITRAL VALVE REPAIR  (MVR) with size 26 Sorin Memo 3D;  Surgeon: [name], MD;  Location: [place];  Service: Open Heart Surgery;  Laterality: Right;   MITRAL VALVE REPAIR  03/2016   RIGHT KNEE ED COMPARTMENT REPLACEMENT  2004   SYNOVECTOMY  07/22/2011   Procedure: SYNOVECTOMY;  Surgeon: Dione Plover Aluisio;  Location: [place];  Service: Orthopedics;;   TEE WITHOUT CARDIOVERSION N/A 01/23/2016   Procedure: TRANSESOPHAGEAL ECHOCARDIOGRAM (TEE);  Surgeon: [name], MD;  Location: [place];  Service: Cardiovascular;  Laterality: N/A;   TEE WITHOUT CARDIOVERSION N/A 03/25/2016   Procedure: TRANSESOPHAGEAL ECHOCARDIOGRAM (TEE);  Surgeon: [name], MD;  Location: [place];  Service: Open Heart Surgery;  Laterality: N/A;   TEE WITHOUT CARDIOVERSION N/A 03/04/2021   Procedure: TRANSESOPHAGEAL ECHOCARDIOGRAM (TEE);  Surgeon: Lelon Perla, MD;  Location: [facility] ENDOSCOPY;  Service: Cardiovascular;  Laterality: N/A;   UMBILICAL HERNIA REPAIR  01-15-09   W/ GASTRIC BANDING PROCEDURE   Social History   Socioeconomic History   Marital status: Married    Spouse name: Not on file   Number of children: 1   Years of education: Not on file   Highest education level: Not on file  Occupational History   Contractor, semi-retired  Social Needs   Financial resource strain: Not on file   Food  insecurity:    Worry: Not on file    Inability: Not on file   Transportation needs:    Medical:  Not on file    Non-medical: Not on file  Tobacco Use   Smoking status: Never Smoker   Smokeless tobacco: Never Used  Substance and Sexual Activity   Alcohol use: Yes, liquor    Comment: OCCASIONAL   Drug use: No   Sexual activity: Not on file  Lifestyle   Physical activity:    Days per week: Not on file    Minutes per session: Not on file   Stress: Not on file  Relationships   Social connections:    Talks on phone: Not on file    Gets together: Not on file    Attends religious service: Not on file    Active member of club or organization: Not on file    Attends meetings of clubs or organizations: Not on file    Relationship status: Not on file   Intimate partner violence:    Fear of current or ex partner: Not on file    Emotionally abused: Not on file    Physically abused: Not on file    Forced sexual activity: Not on file  Other Topics Concern   Not on file  Social History Narrative   Not on file   Current Outpatient Medications on File Prior to Visit  Medication Sig Dispense Refill   acetaminophen (TYLENOL) 325 MG tablet Take 2 tablets (650 mg total) by mouth every 6 (six) hours as needed for mild pain (or Fever >/= 101).     Alirocumab (PRALUENT) 75 MG/ML SOAJ Inject 75 mg into the skin every 14 (fourteen) days.     allopurinol (ZYLOPRIM) 100 MG tablet Take 0.5 tablets (50 mg total) by mouth 2 (two) times a week. Please discuss dosing with your PCP based on your renal function. 4 tablet 0   amiodarone (PACERONE) 200 MG tablet Take 0.5 tablets (100 mg total) by mouth daily. 60 tablet 0   apixaban (ELIQUIS) 5 MG TABS tablet Take 1 tablet (5 mg total) by mouth 2 (two) times daily. 180 tablet 3   Ascorbic Acid (VITAMIN C WITH ROSE HIPS) 1000 MG tablet Take 1,000 mg by mouth daily.     calcitRIOL (ROCALTROL) 0.25 MCG capsule Take 0.25 mcg by mouth every other day.      Carboxymethylcellulose Sodium 1 % GEL Place 1 drop into both eyes 3 (three) times daily as needed (dry eyes).     cetirizine (ZYRTEC) 10 MG tablet Take 10 mg by mouth daily.     colchicine 0.6 MG tablet Take 0.6-1.2 mg by mouth 2 (two) times daily as needed (gout).      diclofenac Sodium (VOLTAREN) 1 % GEL Apply 2 g topically 3 (three) times daily as needed for pain.     EFUDEX 5 % cream Apply 1 application topically daily.     fluticasone (FLONASE) 50 MCG/ACT nasal spray Place 1 spray into both nostrils daily as needed for allergies or rhinitis.     furosemide (LASIX) 40 MG tablet Take 40 mg by mouth daily.     HYDROcodone-acetaminophen (NORCO/VICODIN) 5-325 MG tablet Take 1 tablet by mouth as needed.     methocarbamol (ROBAXIN) 500 MG tablet Take 500 mg by mouth 3 (three) times daily as needed for muscle spasms.     Multiple Vitamin (MULTIVITAMIN) capsule Take 1 capsule by mouth daily.     nitroGLYCERIN (NITROSTAT) 0.4 MG SL tablet 1 TAB UNDER THE TONGUE EVERY 5 MINUTES AS NEEDED FOR CHEST PAIN UP TO 3 DOSES, IF PERSIST CALL [phone]  tablet 3   ONETOUCH VERIO test strip 1 each daily.     oxyCODONE-acetaminophen (PERCOCET/ROXICET) 5-325 MG tablet Take 1-2 tablets by mouth every 4 (four) hours as needed for moderate pain. 30 tablet 0   pantoprazole (PROTONIX) 40 MG tablet Take 40 mg by mouth daily as needed (heartburn).     Semaglutide,0.25 or 0.'5MG'$ /DOS, (OZEMPIC, 0.25 OR 0.5 MG/DOSE,) 2 MG/1.5ML SOPN Inject 0.375 mLs (0.5 mg total) into the skin every Thursday. (Patient taking differently: Inject 0.5 mg into the skin once a week. Tuesday) 3 pen 3   sildenafil (VIAGRA) 100 MG tablet Take 50 mg by mouth daily as needed for erectile dysfunction.     traZODone (DESYREL) 100 MG tablet Take 100 mg by mouth at bedtime as needed for sleep.     No current facility-administered medications on file prior to visit.   Allergies  Allergen Reactions   Crestor [Rosuvastatin] Other (See Comments)    Muscle  aches    Lipitor [Atorvastatin] Other (See Comments)    Muscle aches   Pravastatin Other (See Comments)    Muscle aches   Carvedilol     Other reaction(s): loss of appetite   Metformin Diarrhea   Serotonin     Other reaction(s): Other (See Comments) Other reaction(s): Muscle aches   Serotonin Reuptake Inhibitors (Ssris)     Other reaction(s): Muscle aches   Zocor [Simvastatin]     Other reaction(s): Muscle pain   Codeine Itching and Other (See Comments)    Extremity tingling-- can take synthetic   Family History  Problem Relation Age of Onset   Other Mother        joint problems   Alzheimer's disease Mother    Hypertension Father    Heart disease Father    CVA Father        age 79   Depression Father    Heart disease Sister        CABG   Stroke Sister    Heart failure Sister        congestive   Diabetes Sister    Heart disease Brother    Hypertension Brother    Congestive Heart Failure Brother     PE: BP 120/84 (BP Location: Right Arm, Patient Position: Sitting, Cuff Size: Normal)    Pulse 86    Ht '5\' 9"'$  (1.753 m)    Wt 198 lb 9.6 oz (90.1 kg)    SpO2 96%    BMI 29.33 kg/m  Wt Readings from Last 3 Encounters:  10/30/21 198 lb 9.6 oz (90.1 kg)  09/12/21 192 lb 6.4 oz (87.3 kg)  08/28/21 190 lb (86.2 kg)   Constitutional: overweight, in NAD Eyes: PERRLA, EOMI, no exophthalmos ENT: moist mucous membranes, no thyromegaly, no cervical lymphadenopathy Cardiovascular: RRR, No MRG Respiratory: CTA B Musculoskeletal: no deformities, strength intact in all 4 Skin: moist, warm, no rashes except multiple ecchymosis on bilateral dorsum of hands Neurological: + tremor with outstretched R hand, DTR normal in all 4  ASSESSMENT: 1. DM2, non-insulin-dependent, uncontrolled, with long-term complications - CAD, s/p coronaroplasty, then CABG 1995 - cardiologist - Dr. Aundra Dubin - CHF, EF 55 to 65% - Atrial fibrillation, cardioversion in 2019 - Carotid artery disease -  Cerebrovascular disease, s/p TIA - Ao ATS per abdominal CT (08/31/2021) - CKD stage V, pending dialysis -Dr. Jimmy Footman - ED  2. HL  3.  Obesity class I  PLAN:  1. Patient with longstanding, previously uncontrolled type 2 diabetes, with improved control lately.  He had to come off metformin due to declining kidney function.  We also stopped sulfonylurea, due to good control.  Currently on Ozempic.  At last visit he had a few blood sugars above goal in the morning and he was not checking later in the day.  We discussed about checking some sugars at least at bedtime, also.  Otherwise, we did not change his regimen.  HbA1c was slightly higher at 5.4%, but since then he had another HbA1c obtained on 09/24/2021 and this was lower, at 5.8%. -At today's visit, he still only checks his sugars in the morning and did not start taking at night.  The sugars in the morning are at goal with only occasional slightly higher blood sugars.  However, I advised him to check some sugars later in the day, also.  He is interested in a CGM and I recommended the Dexcom G7 device.  He will try to get this through the [place]. -For now, we will continue the current regimen.  He tolerates Ozempic well.  He gets this through the [place], also. - I suggested to:  Patient Instructions  Please continue: - Ozempic 0.5 mg weekly   You can take Glipizide 2.5-5 mg before a larger dinner.  Check some sugars at night, also.  Please try to start the [place] CGM.  Please return in 4 months with your sugar log or CGM.   - we checked his HbA1c: 6.3% (higher) - advised to check sugars at different times of the day - 1x a day, rotating check times - advised for yearly eye exams >> he is UTD - UTD with foot exam by PCP - return to clinic in 4 months  2. HL -Reviewed latest lipid panel from 09/24/2021: 142/100/59/65: LDL slightly above target but much improved, the rest of the fractions at goal: Lab Results  Component Value Date   CHOL 188  06/17/2021   HDL 60 06/17/2021   LDLCALC 104 (H) 06/17/2021   TRIG 118 06/17/2021   CHOLHDL 3.1 06/17/2021  -He is on Zetia 10 mg daily and Praluent.  He had side effects from statins in the past.  3.  Obesity class I -We will continue Ozempic which should also help with weight loss -He gained 15 pounds before last visit, previously lost 22 -since last OV, he gained 12 lbs  Philemon Kingdom, MD PhD [facility] Endocrinology

## 2021-10-30 NOTE — Patient Instructions (Addendum)
Please continue: ?- Ozempic 0.5 mg weekly  ? ?You can take Glipizide 2.5-5 mg before a larger dinner. ? ?Check some sugars at night, also. ? ?Please try to start the [place] CGM. ? ?Please return in 4 months with your sugar log or CGM.  ? ? ?

## 2021-11-04 ENCOUNTER — Other Ambulatory Visit: Payer: Self-pay | Admitting: *Deleted

## 2021-11-04 ENCOUNTER — Ambulatory Visit: Payer: No Typology Code available for payment source | Admitting: *Deleted

## 2021-11-04 NOTE — Patient Outreach (Signed)
?[name]) Care Management ?Telephonic RN Care Manager Note ? ? ?11/04/2021 ?Name:  [name] MRN:  [id number] DOB:  [birth year] ? ?Summary: ?Outgoing call placed to member, successful.  Denies any urgent concerns, encouraged to contact this care manager with questions.  ? ?Recommendations/Changes made from today's visit: ?Continue to adhere to low salt diet.  Monitor and record daily heart rate, blood pressure, weight, and blood sugar. ? ?Subjective: ?[name] is an 76 y.o. year old male who is a primary patient of Jason Cruel, MD. The care management team was consulted for assistance with care management and/or care coordination needs.   ? ?Telephonic RN Care Manager completed Telephone Visit today. ? ?Objective:  ? ?Medications Reviewed Today   ? ? Reviewed by Philemon Kingdom, MD (Physician) on 10/30/21 at 1414  Med List Status: <None>  ? ?Medication Order Taking? Sig Documenting Provider Last Dose Status Informant  ?acetaminophen (TYLENOL) 325 MG tablet [id number] Yes Take 2 tablets (650 mg total) by mouth every 6 (six) hours as needed for mild pain (or Fever >/= 101). [name], MD Taking Active Multiple Informants  ?Alirocumab (PRALUENT) 75 MG/ML SOAJ [id number] Yes Inject 75 mg into the skin every 14 (fourteen) days. [provider] Taking Active Multiple Informants  ?allopurinol (ZYLOPRIM) 100 MG tablet [id number] Yes Take 0.5 tablets (50 mg total) by mouth 2 (two) times a week. Please discuss dosing with your PCP based on your renal function. [name], MD Taking Active Multiple Informants  ?amiodarone (PACERONE) 200 MG tablet [id number] Yes Take 0.5 tablets (100 mg total) by mouth daily. [name], [name], FNP Taking Active Multiple Informants  ?apixaban (ELIQUIS) 5 MG TABS tablet [id number] Yes Take 1 tablet (5 mg total) by mouth 2 (two) times daily. [name], MD Taking Active   ?Ascorbic Acid (VITAMIN C WITH ROSE HIPS) 1000 MG tablet  [id number] Yes Take 1,000 mg by mouth daily. [provider] Taking Active Multiple Informants  ?calcitRIOL (ROCALTROL) 0.25 MCG capsule [id number] Yes Take 0.25 mcg by mouth every other day. [provider] Taking Active Multiple Informants  ?Carboxymethylcellulose Sodium 1 % GEL [id number] Yes Place 1 drop into both eyes 3 (three) times daily as needed (dry eyes). [provider] Taking Active Multiple Informants  ?cetirizine (ZYRTEC) 10 MG tablet [id number] Yes Take 10 mg by mouth daily. [provider] Taking Active Multiple Informants  ?colchicine 0.6 MG tablet [id number] Yes Take 0.6-1.2 mg by mouth 2 (two) times daily as needed (gout).  [provider] Taking Active Multiple Informants  ?diclofenac Sodium (VOLTAREN) 1 % GEL [id number] Yes Apply 2 g topically 3 (three) times daily as needed for pain. [provider] Taking Active Multiple Informants  ?EFUDEX 5 % cream [id number] Yes Apply 1 application topically daily. [provider] Taking Active Multiple Informants  ?fluticasone (FLONASE) 50 MCG/ACT nasal spray [id number] Yes Place 1 spray into both nostrils daily as needed for allergies or rhinitis. [provider] Taking Active Multiple Informants  ?furosemide (LASIX) 40 MG tablet [id number] Yes Take 40 mg by mouth daily. [provider] Taking Active Multiple Informants  ?HYDROcodone-acetaminophen (NORCO/VICODIN) 5-325 MG tablet [id number] Yes Take 1 tablet by mouth as needed. [provider] Taking Active   ?methocarbamol (ROBAXIN) 500 MG tablet [id number] Yes Take 500 mg by mouth 3 (three) times daily as needed for muscle spasms. [provider] Taking Active Multiple Informants  ?Multiple Vitamin (MULTIVITAMIN) capsule [id number] Yes Take 1 capsule by  mouth daily. [provider] Taking Active Multiple Informants  ?nitroGLYCERIN (NITROSTAT) 0.4 MG SL tablet [id number] Yes 1 TAB UNDER THE TONGUE EVERY 5  MINUTES AS NEEDED FOR CHEST PAIN UP TO 3 DOSES, IF PERSIST CALL 911 [name], MD Taking Active   ?ONETOUCH VERIO test strip [id number] Yes 1 each daily. [provider] Taking Active Multiple Informants  ?oxyCODONE-acetaminophen (PERCOCET/ROXICET) 5-325 MG tablet [id number] Yes Take 1-2 tablets by mouth every 4 (four) hours as needed for moderate pain. [name], MD Taking Active   ?pantoprazole (PROTONIX) 40 MG tablet [id number] Yes Take 40 mg by mouth daily as needed (heartburn). [provider] Taking Active Multiple Informants  ?Semaglutide,0.25 or 0.5MG/DOS, (OZEMPIC, 0.25 OR 0.5 MG/DOSE,) 2 MG/1.5ML SOPN [id number] Yes Inject 0.375 mLs (0.5 mg total) into the skin every Thursday.  ?Patient taking differently: Inject 0.5 mg into the skin once a week. Tuesday  ? Philemon Kingdom, MD Taking Active Multiple Informants  ?sildenafil (VIAGRA) 100 MG tablet [id number] Yes Take 50 mg by mouth daily as needed for erectile dysfunction. [provider] Taking Active Multiple Informants  ?traZODone (DESYREL) 100 MG tablet [id number] Yes Take 100 mg by mouth at bedtime as needed for sleep. [provider] Taking Active Multiple Informants  ?Med List Note [name], MD 04/13/11 2118): CPAP  ? ?  ?  ? ?  ? ? ? ?SDOH:  (Social Determinants of Health) assessments and interventions performed:  ? ? ? ?Care Plan ? ?Review of patient past medical history, allergies, medications, health status, including review of consultants reports, laboratory and other test data, was performed as part of comprehensive evaluation for care management services.  ? ?Care Plan : General Plan of Care (Adult)  ?Updates made by Valente David, RN since 11/04/2021 12:00 AM  ?  ? ?Problem: Management of chronic medical conditions (HTN, CHF, A-fib)   ?Priority: High  ?  ? ?Long-Range Goal: Member will be able to adequately verbalize plan of care for management of chronic medical conditions   ?Start  Date: 08/07/2021  ?Expected End Date: 02/03/2022  ?This Visit's Progress: On track  ?Recent Progress: On track  ?Priority: High  ?Note:   ?Current Barriers:  ?Chronic Disease Management support and education needs related to Atrial Fibrillation, CHF, and HTN ? ?RNCM Clinical Goal(s):  ?Patient will verbalize understanding of plan for management of Atrial Fibrillation, CHF, and HTN as evidenced by verbalizing plan of care ?take all medications exactly as prescribed and will call provider for medication related questions as evidenced by member reported adherence    ?attend all scheduled medical appointments: Neurology, PCP, cardiology as evidenced by Member reported adherence        ?continue to work with Consulting civil engineer and/or Social Worker to address care management and care coordination needs related to Atrial Fibrillation, CHF, and HTN as evidenced by adherence to CM Team Scheduled appointments     through collaboration with RN Care manager, provider, and care team.  ? ?Interventions: ?Inter-disciplinary care team collaboration (see longitudinal plan of care) ?Evaluation of current treatment plan related to  self management and patient's adherence to plan as established by provider ? ? ?A-fib:  (Status: Goal on Track (progressing): YES.) Long Term Goal  ?Counseled on increased risk of stroke due to Afib and benefits of anticoagulation for stroke prevention           ?Reviewed importance of adherence to anticoagulant exactly as prescribed ?Afib action plan reviewed ?Screening for signs and symptoms  of depression related to chronic disease state ? ?Heart Failure Interventions:  (Status: Goal on Track (progressing): YES.)  Long Term Goal  ?Provided education on low sodium diet ?Reviewed Heart Failure Action Plan in depth and provided written copy ?Discussed importance of daily weight and advised patient to weigh and record daily ? ?Hypertension: (Status: Goal on Track (progressing): YES.) ?Last practice recorded BP  readings:  ?BP Readings from Last 3 Encounters:  ?10/30/21 120/84  ?09/12/21 118/64  ?09/03/21 126/69  ?Most recent eGFR/CrCl: No results found for: EGFR  No components found for: CRCL ? ?Provided education to

## 2021-12-02 ENCOUNTER — Other Ambulatory Visit: Payer: Self-pay | Admitting: *Deleted

## 2021-12-02 DIAGNOSIS — E291 Testicular hypofunction: Secondary | ICD-10-CM | POA: Diagnosis not present

## 2021-12-02 DIAGNOSIS — I10 Essential (primary) hypertension: Secondary | ICD-10-CM | POA: Diagnosis not present

## 2021-12-02 DIAGNOSIS — M79602 Pain in left arm: Secondary | ICD-10-CM | POA: Diagnosis not present

## 2021-12-02 DIAGNOSIS — E7800 Pure hypercholesterolemia, unspecified: Secondary | ICD-10-CM | POA: Diagnosis not present

## 2021-12-02 DIAGNOSIS — R6889 Other general symptoms and signs: Secondary | ICD-10-CM | POA: Diagnosis not present

## 2021-12-02 DIAGNOSIS — N184 Chronic kidney disease, stage 4 (severe): Secondary | ICD-10-CM | POA: Diagnosis not present

## 2021-12-02 DIAGNOSIS — R5383 Other fatigue: Secondary | ICD-10-CM | POA: Diagnosis not present

## 2021-12-02 DIAGNOSIS — I2510 Atherosclerotic heart disease of native coronary artery without angina pectoris: Secondary | ICD-10-CM | POA: Diagnosis not present

## 2021-12-02 DIAGNOSIS — E559 Vitamin D deficiency, unspecified: Secondary | ICD-10-CM | POA: Diagnosis not present

## 2021-12-02 DIAGNOSIS — E1122 Type 2 diabetes mellitus with diabetic chronic kidney disease: Secondary | ICD-10-CM | POA: Diagnosis not present

## 2021-12-02 DIAGNOSIS — N189 Chronic kidney disease, unspecified: Secondary | ICD-10-CM | POA: Diagnosis not present

## 2021-12-02 NOTE — Patient Outreach (Signed)
?[name]) Care Management ?Telephonic RN Care Manager Note ? ? ?12/02/2021 ?Name:  [name] MRN:  [id number] DOB:  [birth year] ? ?Summary: ?Outgoing call placed to member, successful to wife.  Denies any urgent concerns, encouraged to contact this care manager with questions.   ? ? ?Subjective: ?[name] is an 76 y.o. year old male who is a primary patient of Jason Cruel, MD. The care management team was consulted for assistance with care management and/or care coordination needs.   ? ?Telephonic RN Care Manager completed Telephone Visit today. ? ?Objective:  ? ?Medications Reviewed Today   ? ? Reviewed by Philemon Kingdom, MD (Physician) on 10/30/21 at 1414  Med List Status: <None>  ? ?Medication Order Taking? Sig Documenting Provider Last Dose Status Informant  ?acetaminophen (TYLENOL) 325 MG tablet [id number] Yes Take 2 tablets (650 mg total) by mouth every 6 (six) hours as needed for mild pain (or Fever >/= 101). Jason Altes, MD Taking Active Multiple Informants  ?Alirocumab (PRALUENT) 75 MG/ML SOAJ [id number] Yes Inject 75 mg into the skin every 14 (fourteen) days. [provider] Taking Active Multiple Informants  ?allopurinol (ZYLOPRIM) 100 MG tablet [id number] Yes Take 0.5 tablets (50 mg total) by mouth 2 (two) times a week. Please discuss dosing with your PCP based on your renal function. [name], MD Taking Active Multiple Informants  ?amiodarone (PACERONE) 200 MG tablet [id number] Yes Take 0.5 tablets (100 mg total) by mouth daily. [name], [name], FNP Taking Active Multiple Informants  ?apixaban (ELIQUIS) 5 MG TABS tablet [id number] Yes Take 1 tablet (5 mg total) by mouth 2 (two) times daily. Jason Hamman, MD Taking Active   ?Ascorbic Acid (VITAMIN C WITH ROSE HIPS) 1000 MG tablet [id number] Yes Take 1,000 mg by mouth daily. [provider] Taking Active Multiple Informants  ?calcitRIOL (ROCALTROL) 0.25 MCG capsule [id number]  Yes Take 0.25 mcg by mouth every other day. [provider] Taking Active Multiple Informants  ?Carboxymethylcellulose Sodium 1 % GEL [id number] Yes Place 1 drop into both eyes 3 (three) times daily as needed (dry eyes). [provider] Taking Active Multiple Informants  ?cetirizine (ZYRTEC) 10 MG tablet [id number] Yes Take 10 mg by mouth daily. [provider] Taking Active Multiple Informants  ?colchicine 0.6 MG tablet [id number] Yes Take 0.6-1.2 mg by mouth 2 (two) times daily as needed (gout).  [provider] Taking Active Multiple Informants  ?diclofenac Sodium (VOLTAREN) 1 % GEL [id number] Yes Apply 2 g topically 3 (three) times daily as needed for pain. [provider] Taking Active Multiple Informants  ?EFUDEX 5 % cream [id number] Yes Apply 1 application topically daily. [provider] Taking Active Multiple Informants  ?fluticasone (FLONASE) 50 MCG/ACT nasal spray [id number] Yes Place 1 spray into both nostrils daily as needed for allergies or rhinitis. [provider] Taking Active Multiple Informants  ?furosemide (LASIX) 40 MG tablet [id number] Yes Take 40 mg by mouth daily. [provider] Taking Active Multiple Informants  ?HYDROcodone-acetaminophen (NORCO/VICODIN) 5-325 MG tablet [id number] Yes Take 1 tablet by mouth as needed. [provider] Taking Active   ?methocarbamol (ROBAXIN) 500 MG tablet [id number] Yes Take 500 mg by mouth 3 (three) times daily as needed for muscle spasms. [provider] Taking Active Multiple Informants  ?Multiple Vitamin (MULTIVITAMIN) capsule [id number] Yes Take 1 capsule by mouth daily. [provider] Taking Active Multiple Informants  ?nitroGLYCERIN (NITROSTAT) 0.4 MG SL tablet [id number] Yes 1 TAB UNDER THE  TONGUE EVERY 5 MINUTES AS NEEDED FOR CHEST PAIN UP TO 3 DOSES, IF PERSIST CALL 911 Jason Dresser, MD Taking Active   ?ONETOUCH VERIO test strip [id number] Yes 1 each daily.  [provider] Taking Active Multiple Informants  ?oxyCODONE-acetaminophen (PERCOCET/ROXICET) 5-325 MG tablet [id number] Yes Take 1-2 tablets by mouth every 4 (four) hours as needed for moderate pain. [name], MD Taking Active   ?pantoprazole (PROTONIX) 40 MG tablet [id number] Yes Take 40 mg by mouth daily as needed (heartburn). [provider] Taking Active Multiple Informants  ?Semaglutide,0.25 or 0.5MG/DOS, (OZEMPIC, 0.25 OR 0.5 MG/DOSE,) 2 MG/1.5ML SOPN [id number] Yes Inject 0.375 mLs (0.5 mg total) into the skin every Thursday.  ?Patient taking differently: Inject 0.5 mg into the skin once a week. Tuesday  ? Philemon Kingdom, MD Taking Active Multiple Informants  ?sildenafil (VIAGRA) 100 MG tablet [id number] Yes Take 50 mg by mouth daily as needed for erectile dysfunction. [provider] Taking Active Multiple Informants  ?traZODone (DESYREL) 100 MG tablet [id number] Yes Take 100 mg by mouth at bedtime as needed for sleep. [provider] Taking Active Multiple Informants  ?Med List Note Jason Lever, MD 04/13/11 2118): CPAP  ? ?  ?  ? ?  ? ? ? ?SDOH:  (Social Determinants of Health) assessments and interventions performed:  ? ? ? ?Care Plan ? ?Review of patient past medical history, allergies, medications, health status, including review of consultants reports, laboratory and other test data, was performed as part of comprehensive evaluation for care management services.  ? ?Care Plan : General Plan of Care (Adult)  ?Updates made by Valente David, RN since 12/02/2021 12:00 AM  ?  ? ?Problem: Management of chronic medical conditions (HTN, CHF, A-fib)   ?Priority: High  ?  ? ?Long-Range Goal: Member will be able to adequately verbalize plan of care for management of chronic medical conditions   ?Start Date: 08/07/2021  ?Expected End Date: 02/03/2022  ?This Visit's Progress: On track  ?Recent Progress: On track  ?Priority: High  ?Note:   ?Current Barriers:   ?Chronic Disease Management support and education needs related to Atrial Fibrillation, CHF, and HTN ? ?RNCM Clinical Goal(s):  ?Patient will verbalize understanding of plan for management of Atrial Fibrillation, CHF, and HTN as evidenced by verbalizing plan of care ?take all medications exactly as prescribed and will call provider for medication related questions as evidenced by member reported adherence    ?attend all scheduled medical appointments: Neurology, PCP, cardiology as evidenced by Member reported adherence        ?continue to work with Consulting civil engineer and/or Social Worker to address care management and care coordination needs related to Atrial Fibrillation, CHF, and HTN as evidenced by adherence to CM Team Scheduled appointments     through collaboration with RN Care manager, provider, and care team.  ? ?Interventions: ?Inter-disciplinary care team collaboration (see longitudinal plan of care) ?Evaluation of current treatment plan related to  self management and patient's adherence to plan as established by provider ? ? ?A-fib:  (Status: Goal on Track (progressing): YES.) Long Term Goal  ?Counseled on increased risk of stroke due to Afib and benefits of anticoagulation for stroke prevention           ?Reviewed importance of adherence to anticoagulant exactly as prescribed ?Afib action plan reviewed ?Screening for signs and symptoms of depression related to chronic disease state ? ?Heart Failure Interventions:  (Status: Goal on Track (progressing): YES.)  Long Term Goal  ?  Provided education on low sodium diet ?Reviewed Heart Failure Action Plan in depth and provided written copy ?Discussed importance of daily weight and advised patient to weigh and record daily ? ?Hypertension: (Status: Goal on Track (progressing): YES.) ?Last practice recorded BP readings:  ?BP Readings from Last 3 Encounters:  ?10/30/21 120/84  ?09/12/21 118/64  ?09/03/21 126/69  ?Most recent eGFR/CrCl: No results found for: EGFR  No  components found for: CRCL ? ?Provided education to patient re: stroke prevention, s/s of heart attack and stroke; ?Reviewed medications with patient and discussed importance of compliance;  ?Discussed plans wi

## 2021-12-09 ENCOUNTER — Other Ambulatory Visit: Payer: Self-pay | Admitting: *Deleted

## 2021-12-09 NOTE — Patient Outreach (Signed)
[facility] Care Management ? ?12/09/2021 ? ?[name] ?[birth year] ?[phone] ? ? ?Outgoing call placed to member, no answer, HIPAA compliant voice message left.  Will follow up within the next 3 business days. ? ?Valente David, RN, MSN, CCM ?[facility] Care Management  ?Community Care Manager ?[phone] ? ?

## 2021-12-10 DIAGNOSIS — L570 Actinic keratosis: Secondary | ICD-10-CM | POA: Diagnosis not present

## 2021-12-10 DIAGNOSIS — Z85828 Personal history of other malignant neoplasm of skin: Secondary | ICD-10-CM | POA: Diagnosis not present

## 2021-12-10 DIAGNOSIS — D485 Neoplasm of uncertain behavior of skin: Secondary | ICD-10-CM | POA: Diagnosis not present

## 2021-12-10 DIAGNOSIS — C44622 Squamous cell carcinoma of skin of right upper limb, including shoulder: Secondary | ICD-10-CM | POA: Diagnosis not present

## 2021-12-10 DIAGNOSIS — D0461 Carcinoma in situ of skin of right upper limb, including shoulder: Secondary | ICD-10-CM | POA: Diagnosis not present

## 2021-12-12 ENCOUNTER — Other Ambulatory Visit: Payer: Self-pay | Admitting: *Deleted

## 2021-12-12 NOTE — Patient Outreach (Signed)
?[facility]) Care Management ?Telephonic RN Care Manager Note ? ? ?12/12/2021 ?Name:  [name] MRN:  [id number] DOB:  04-19-1946 ? ?Summary: ?Outreach attempt #2, successful.  Denies any urgent concerns, encouraged to contact this care manager with questions.   ? ?Subjective: ?[name] is an 76 y.o. year old male who is a primary patient of [name], MD. The care management team was consulted for assistance with care management and/or care coordination needs.   ? ?Telephonic RN Care Manager completed Telephone Visit today. ? ?Objective:  ? ?Medications Reviewed Today   ? ? Reviewed by Philemon Kingdom, MD (Physician) on 10/30/21 at 1414  Med List Status: <None>  ? ?Medication Order Taking? Sig Documenting Provider Last Dose Status Informant  ?acetaminophen (TYLENOL) 325 MG tablet [id number] Yes Take 2 tablets (650 mg total) by mouth every 6 (six) hours as needed for mild pain (or Fever >/= 101). [name], MD Taking Active Multiple Informants  ?Alirocumab (PRALUENT) 75 MG/ML SOAJ [id number] Yes Inject 75 mg into the skin every 14 (fourteen) days. [provider] Taking Active Multiple Informants  ?allopurinol (ZYLOPRIM) 100 MG tablet [id number] Yes Take 0.5 tablets (50 mg total) by mouth 2 (two) times a week. Please discuss dosing with your PCP based on your renal function. [name], MD Taking Active Multiple Informants  ?amiodarone (PACERONE) 200 MG tablet [id number] Yes Take 0.5 tablets (100 mg total) by mouth daily. [name], [name], FNP Taking Active Multiple Informants  ?apixaban (ELIQUIS) 5 MG TABS tablet [id number] Yes Take 1 tablet (5 mg total) by mouth 2 (two) times daily. [name], MD Taking Active   ?Ascorbic Acid (VITAMIN C WITH ROSE HIPS) 1000 MG tablet [id number] Yes Take 1,000 mg by mouth daily. [provider] Taking Active Multiple Informants  ?calcitRIOL (ROCALTROL) 0.25 MCG capsule [id number] Yes Take 0.25 mcg by  mouth every other day. [provider] Taking Active Multiple Informants  ?Carboxymethylcellulose Sodium 1 % GEL [id number] Yes Place 1 drop into both eyes 3 (three) times daily as needed (dry eyes). [provider] Taking Active Multiple Informants  ?cetirizine (ZYRTEC) 10 MG tablet [id number] Yes Take 10 mg by mouth daily. [provider] Taking Active Multiple Informants  ?colchicine 0.6 MG tablet [id number] Yes Take 0.6-1.2 mg by mouth 2 (two) times daily as needed (gout).  [provider] Taking Active Multiple Informants  ?diclofenac Sodium (VOLTAREN) 1 % GEL [id number] Yes Apply 2 g topically 3 (three) times daily as needed for pain. [provider] Taking Active Multiple Informants  ?EFUDEX 5 % cream [id number] Yes Apply 1 application topically daily. [provider] Taking Active Multiple Informants  ?fluticasone (FLONASE) 50 MCG/ACT nasal spray [id number] Yes Place 1 spray into both nostrils daily as needed for allergies or rhinitis. [provider] Taking Active Multiple Informants  ?furosemide (LASIX) 40 MG tablet [id number] Yes Take 40 mg by mouth daily. [provider] Taking Active Multiple Informants  ?HYDROcodone-acetaminophen (NORCO/VICODIN) 5-325 MG tablet [id number] Yes Take 1 tablet by mouth as needed. [provider] Taking Active   ?methocarbamol (ROBAXIN) 500 MG tablet [id number] Yes Take 500 mg by mouth 3 (three) times daily as needed for muscle spasms. [provider] Taking Active Multiple Informants  ?Multiple Vitamin (MULTIVITAMIN) capsule [id number] Yes Take 1 capsule by mouth daily. [provider] Taking Active Multiple Informants  ?nitroGLYCERIN (NITROSTAT) 0.4 MG SL tablet [id number] Yes 1 TAB UNDER THE TONGUE EVERY 5 MINUTES AS  NEEDED FOR CHEST PAIN UP TO 3 DOSES, IF PERSIST CALL 911 [name], MD Taking Active   ?ONETOUCH VERIO test strip [id number] Yes 1 each daily. [provider] Taking Active Multiple Informants  ?oxyCODONE-acetaminophen (PERCOCET/ROXICET) 5-325 MG tablet [id number] Yes Take 1-2 tablets by mouth every 4 (four) hours as needed for moderate pain. [name], MD Taking Active   ?pantoprazole (PROTONIX) 40 MG tablet [id number] Yes Take 40 mg by mouth daily as needed (heartburn). [provider] Taking Active Multiple Informants  ?Semaglutide,0.25 or 0.5MG/DOS, (OZEMPIC, 0.25 OR 0.5 MG/DOSE,) 2 MG/1.5ML SOPN [id number] Yes Inject 0.375 mLs (0.5 mg total) into the skin every Thursday.  ?Patient taking differently: Inject 0.5 mg into the skin once a week. Tuesday  ? Philemon Kingdom, MD Taking Active Multiple Informants  ?sildenafil (VIAGRA) 100 MG tablet [id number] Yes Take 50 mg by mouth daily as needed for erectile dysfunction. [provider] Taking Active Multiple Informants  ?traZODone (DESYREL) 100 MG tablet [id number] Yes Take 100 mg by mouth at bedtime as needed for sleep. [provider] Taking Active Multiple Informants  ?Med List Note [name], MD 04/13/11 2118): CPAP  ? ?  ?  ? ?  ? ? ? ?SDOH:  (Social Determinants of Health) assessments and interventions performed:  ? ? ? ?Care Plan ? ?Review of patient past medical history, allergies, medications, health status, including review of consultants reports, laboratory and other test data, was performed as part of comprehensive evaluation for care management services.  ? ?Care Plan : General Plan of Care (Adult)  ?Updates made by Valente David, RN since 12/12/2021 12:00 AM  ?  ? ?Problem: Management of chronic medical conditions (HTN, CHF, A-fib)   ?Priority: High  ?  ? ?Long-Range Goal: Member will be able to adequately verbalize plan of care for management of chronic medical conditions   ?Start Date: 08/07/2021  ?Expected End Date: 02/03/2022  ?This Visit's Progress: On track  ?Recent Progress: On track  ?Priority: High  ?Note:   ?Current Barriers:  ?Chronic Disease Management  support and education needs related to Atrial Fibrillation, CHF, and HTN ? ?RNCM Clinical Goal(s):  ?Patient will verbalize understanding of plan for management of Atrial Fibrillation, CHF, and HTN as evidenced by verbalizing plan of care ?take all medications exactly as prescribed and will call provider for medication related questions as evidenced by member reported adherence    ?attend all scheduled medical appointments: Neurology, PCP, cardiology as evidenced by Member reported adherence        ?continue to work with Consulting civil engineer and/or Social Worker to address care management and care coordination needs related to Atrial Fibrillation, CHF, and HTN as evidenced by adherence to CM Team Scheduled appointments     through collaboration with RN Care manager, provider, and care team.  ? ?Interventions: ?Inter-disciplinary care team collaboration (see longitudinal plan of care) ?Evaluation of current treatment plan related to  self management and patient's adherence to plan as established by provider ? ? ?A-fib:  (Status: Condition stable. Not addressed this visit.) Long Term Goal  ?Counseled on increased risk of stroke due to Afib and benefits of anticoagulation for stroke prevention           ?Reviewed importance of adherence to anticoagulant exactly as prescribed ?Afib action plan reviewed ?Screening for signs and symptoms of depression related to chronic disease state ? ?Heart Failure Interventions:  (Status: Condition stable. Not addressed this visit.)  Long Term Goal  ?Provided education  on low sodium diet ?Reviewed Heart Failure Action Plan in depth and provided written copy ?Discussed importance of daily weight and advised patient to weigh and record daily ? ?Hypertension: (Status: Condition stable. Not addressed this visit.) ?Last practice recorded BP readings:  ?BP Readings from Last 3 Encounters:  ?10/30/21 120/84  ?09/12/21 118/64  ?09/03/21 126/69  ?Most recent eGFR/CrCl: No results found for: EGFR  No  components found for: CRCL ? ?Provided education to patient re: stroke prevention, s/s of heart attack and stroke; ?Reviewed medications with patient and discussed importance of compliance;  ?Discussed

## 2021-12-18 ENCOUNTER — Ambulatory Visit (HOSPITAL_COMMUNITY)
Admission: RE | Admit: 2021-12-18 | Discharge: 2021-12-18 | Disposition: A | Discharge status: Home / Self Care | Payer: Medicare Other | Source: Ambulatory Visit | Attending: Cardiology | Admitting: Cardiology

## 2021-12-18 ENCOUNTER — Encounter (HOSPITAL_COMMUNITY): Payer: Self-pay | Admitting: Cardiology

## 2021-12-18 VITALS — BP 128/70 | HR 62 | Wt 200.0 lb

## 2021-12-18 DIAGNOSIS — I480 Paroxysmal atrial fibrillation: Secondary | ICD-10-CM

## 2021-12-18 DIAGNOSIS — D631 Anemia in chronic kidney disease: Secondary | ICD-10-CM | POA: Diagnosis not present

## 2021-12-18 DIAGNOSIS — I509 Heart failure, unspecified: Secondary | ICD-10-CM | POA: Insufficient documentation

## 2021-12-18 DIAGNOSIS — N200 Calculus of kidney: Secondary | ICD-10-CM | POA: Diagnosis not present

## 2021-12-18 DIAGNOSIS — N2581 Secondary hyperparathyroidism of renal origin: Secondary | ICD-10-CM | POA: Diagnosis not present

## 2021-12-18 DIAGNOSIS — I120 Hypertensive chronic kidney disease with stage 5 chronic kidney disease or end stage renal disease: Secondary | ICD-10-CM | POA: Diagnosis not present

## 2021-12-18 DIAGNOSIS — I4891 Unspecified atrial fibrillation: Secondary | ICD-10-CM | POA: Diagnosis not present

## 2021-12-18 DIAGNOSIS — E1122 Type 2 diabetes mellitus with diabetic chronic kidney disease: Secondary | ICD-10-CM | POA: Diagnosis not present

## 2021-12-18 DIAGNOSIS — M109 Gout, unspecified: Secondary | ICD-10-CM | POA: Diagnosis not present

## 2021-12-18 DIAGNOSIS — N185 Chronic kidney disease, stage 5: Secondary | ICD-10-CM | POA: Diagnosis not present

## 2021-12-18 LAB — TSH: TSH: 1.611 u[IU]/mL (ref 0.350–4.500)

## 2021-12-18 LAB — COMPREHENSIVE METABOLIC PANEL
ALT: 14 U/L (ref 0–44)
AST: 22 U/L (ref 15–41)
Albumin: 4.1 g/dL (ref 3.5–5.0)
Alkaline Phosphatase: 81 U/L (ref 38–126)
Anion gap: 9 (ref 5–15)
BUN: 40 mg/dL — ABNORMAL HIGH (ref 8–23)
CO2: 26 mmol/L (ref 22–32)
Calcium: 10.8 mg/dL — ABNORMAL HIGH (ref 8.9–10.3)
Chloride: 105 mmol/L (ref 98–111)
Creatinine, Ser: 4.04 mg/dL — ABNORMAL HIGH (ref 0.61–1.24)
GFR, Estimated: 15 mL/min — ABNORMAL LOW (ref >60)
Glucose, Bld: 124 mg/dL — ABNORMAL HIGH (ref 70–99)
Potassium: 4.5 mmol/L (ref 3.5–5.1)
Sodium: 140 mmol/L (ref 135–145)
Total Bilirubin: 0.9 mg/dL (ref 0.3–1.2)
Total Protein: 6.6 g/dL (ref 6.5–8.1)

## 2021-12-18 LAB — LIPID PANEL
Cholesterol: 171 mg/dL (ref 0–200)
HDL: 50 mg/dL (ref >40)
LDL Cholesterol: 100 mg/dL — ABNORMAL HIGH (ref 0–99)
Total CHOL/HDL Ratio: 3.4 RATIO
Triglycerides: 106 mg/dL (ref <150)
VLDL: 21 mg/dL (ref 0–40)

## 2021-12-18 NOTE — Patient Instructions (Signed)
Good to see you today! ? ?Continue Lasix 80 mg alternating with Lasix 40 mg ? ?Labs done today, your results will be available in MyChart, we will contact you for abnormal readings. ? ?Your physician recommends that you schedule a follow-up appointment in: 4 months ? ?If you have any questions or concerns before your next appointment please send [place] a message through [place] or call our office at [phone].   ? ?TO LEAVE A MESSAGE FOR THE NURSE SELECT OPTION 2, PLEASE LEAVE A MESSAGE INCLUDING: ?YOUR NAME ?DATE OF BIRTH ?CALL BACK NUMBER ?REASON FOR CALL**this is important as we prioritize the call backs ? ?YOU WILL RECEIVE A CALL BACK THE SAME DAY AS LONG AS YOU CALL BEFORE 4:00 PM ? ?At the [facility], you and your health needs are our priority. As part of our continuing mission to provide you with exceptional heart care, we have created designated Provider Care Teams. These Care Teams include your primary Cardiologist (physician) and Advanced Practice Providers (APPs- Physician Assistants and Nurse Practitioners) who all work together to provide you with the care you need, when you need it.  ? ?You may see any of the following providers on your designated Care Team at your next follow up: ?Dr Glori Bickers ?Dr [name] ?Darrick Grinder, NP ?Lyda Jester, PA ?Jessica [name],NP ?Marlyce Huge, PA ?Audry Riles, PharmD ? ? ?Please be sure to bring in all your medications bottles to every appointment.  ? ? ?

## 2021-12-19 ENCOUNTER — Telehealth: Payer: Self-pay

## 2021-12-19 NOTE — Telephone Encounter (Signed)
Called to explain PREP program; he wants to attend and prefers [facility]; will have [place] contact him with schedule for [place].  ?

## 2021-12-20 NOTE — Progress Notes (Signed)
?Patient ID: [name], male   DOB: [birth year], 76 y.o.   MRN: [id number] ?PCP: Dr Harrington Challenger ?Cardiology: Dr. Aundra Dubin ? ?76 y.o. with history of CAD s/p CABG and obesity s/p lap banding procedure presents for cardiology followup.  Given exertional dyspnea and chest tightness, he had LHC in 1/15.  This showed no change from prior.  Grafts were patent and there was severe stenosis in distal D1, not amenable to PCI.  ? ?In 2017, he developed increased exertional dyspnea.  Eventually had a TEE showing severe MR with prolapse of a portion of the anterior mitral valve leaflet.  Coronary angiography in 6/17 showed patent grafts and 90% stenosis of distal diagonal not amenable to intervention.  In 8/17, he had mitral valve repair.  Post-op diastolic CHF, Lasix started and increased.  ? ?He had a cath again in 9/18 showing stable coronary anatomy.   ? ?He developed atrial arrhythmias in 12/18.  Both fibrillation and flutter were seen.  Saw Dr. Rayann Heman, recommended DCCV with amiodarone or sotalol if fibrillation recurs.  He had DCCV in 1/19.  ? ?In 6/19, he was noted to have developed AKI. Creatinine went up to 3.46.  Renal US was done, showing normal kidneys. Cause of AKI was not clear.  We stopped his Lasix. He saw nephrology and eventually went back on Lasix, currently taking 80 mg daily.  He is not using any NSAIDs.   ? ?He stopped pravastatin due to myalgias. We struggled to get him on a PCSK9 inhibitor through the [place] but he was finally able to get Praluent.   ? ?I had him do a Lexiscan Cardiolite in 11/19, which showed old inferior MI with no ischemia.  Echo in 11/19 showed EF 55-60%, s/p MV repair with mean gradient 6 mmHg. Echo 12/20 showed EF 50-55%, mildly decreased RV systolic function, repaired mitral valve with mild MR, mean gradient 6 mmHg across MV.   ? ?He had DCCV from atrial fibrillation to NSR in 1/21 and again in 2/21.  ? ?At last appointment, he reported significant fatigue and dyspnea in setting of weight  loss with low BP and HR.  I stopped his Coreg. He felt better with that change.   ? ?In 9/21, he had COVID-19 infection but was not hospitalized.  ? ?Zio patch in 3/22 showed frequent short atrial tachycardia episodes.  He saw Dr. Rayann Heman, medical therapy recommended (on amiodarone).  ? ?He was admitted in 5/22 with AKI from obstruction of right ureter by stone, he was treated by urology.  ? ?He was admitted in 6/22 with right-sided weakness and was found to have left cerebral white matter CVA.  He had been compliant with Eliquis, ASA 81 was added.  ? ?He was admitted in 7/22 with Enterococcal bacteremia.  TEE showed oscillating density attached to the mitral annulus, ?suture.  However, could not rule out vegetation so had 6 wks of IV abx for SBE.  ? ?Echo in 8/22 showed EF 55-60%, moderate focal basal septal hypertrophy, normal RV, severe LAE, s/p mitral valve repair with mean gradient 8 mmHg (mild to moderate mitral stenosis) and mild MR, large PFO, normal IVC.  ? ?Today he returns for HF follow up.  Weight is up 12 lbs, eating better.  He fatigues easily.  Short of breath walking up stairs, generally ok on flat ground.  No palpitations.  No chest pain.  ? ?ECG (personally reviewed): NSR, 1st degree AVB, LAFB ? ?Labs (11/12): LDL 70 ?Labs (5/13): K 4.1, creatinine  1.1, BNP 48 ?Labs (12/13): K 3.4, creatinine 1.07, LDL 59, HDL 37 ?Labs (1/15): K 4.3, creatinine 1.2, HCT 40.3 ?Labs (3/15): LDL 38, HDL 81, BNP 55, K 4, creatinine 1.2 ?Labs (6/15): K 4.2, creatinine 1.4, LFTs normal, LDL 66, HDL 35 ?Labs (8/16): K 4, creatinine 1.6, BNP 64 ?Labs (12/16): LDL 61, HDL 37 ?Labs (4/17): K 4.2, creatinine 1.35, HCT 38.7, BNP 80 ?Labs (8/17): K 4.2, creatinine 1.4 => 1.66, BNP 159 ?Labs (12/17): K 3.6, creatinine 1.75 ?Labs (2/18): LDL 76, HDL 36 ?Labs (9/18): K 3.9, creatinine 1.76 ?Labs (1/19): K 4.9, creatinine 1.76, hgb 14 ?Labs (6/19): K 3.6, creatinine 3.46 ?Labs (7/19): K 4.1, creatinine 3.66, hgb 12.7 ?Labs (10/19):  LDL 177, hgb 13.4, troponin negative, K 4, creatinine 4.15 ?Labs (12/19): K 4.3, creatinine 3.88 ?Labs (4/20): K 3.6, creatinine 3.39 ?Labs (11/20): LDL 73, HDL 38 ?Labs (2/21): K 3.4, creatinine 3.8 ?Labs (7/21): K 3.7, creatinine 3.66 ?Labs (6/22): K 4, creatinine 2.88, LDL 50 ?Labs (8/22): K 3.6, creatinine 2.79 ?Labs (1/23): K 4.3, creatinine 3.21 ? ?PMH: ?1. OSA: on CPAP.  ?2. Diabetes mellitus ?3. Allergic rhinitis ?4. CAD: s/p CABG 1995.  LHC (10/11) with 90% distal D1 (patent proximal D1 stent), total occlusion CFX, total occlusion RCA, no significant stenoses in LAD, SVG-PDA patent, SVG-OM patent, EF 55%.  Medical management.  LHC (5/13) with patent grafts and 90% distal D1 stenosis (no significant change from 10/11).  LHC (1/15) with patent grafts, 75% ISR in D1 stent, 90% stenosis distal D1 (small caliber). D1 not amenable to intervention.  ?- Coronary angiography (6/17): grafts patent, 90% stenosis distally in large diagonal not amenable to intervention.   ?- LHC (9/18): SVG-OM and SVG-PDA patent,  75% in-stent restenosis in proximal diagonal then 90% stenosis distally (small in caliber at that point), totally occluded LCx and RCA.  Left coronary system similar to prior caths, D1 may be source of angina.  ?- Lexiscan Cardiolite (11/19): EF 47%, old inferior MI, no ischemia.  ?5. Obesity: lap-banding in 5/11.  ?6. Chronic diastolic CHF: Echo (8/00) with EF 55-60%, moderate LVH, moderate diastolic dysfunction, s/p mitral valve repair with elevated mean gradient but normal pressure half-time.  ?- Echo (8/18): EF 55-60%, s/p MV repair with mean gradient 6 mmHg, PASP 28 mmHg, mildly decreased RV systolic function.  ?- Echo (11/19): EF 55-60%, s/p MV repair with mean gradient 6, PASP 42 mmHg, mildly dilated RV, dilated IVC.  ?- Echo (12/20): EF 50-55%, mildly decreased RV systolic function. S/p MV repair with mean gradient 6 mmHg, mild MR.  ?- Echo (2/22): EF 45-50%, mild LVH, moderate RV enlargement with low  normal systolic function, s/p mitral valve repair with mean gradient 5 mmHg and trivial MR, mild-moderate AS.  ?- Echo (8/22): EF 55-60%, moderate focal basal septal hypertrophy, normal RV, severe LAE, s/p mitral valve repair with mean gradient 8 mmHg (mild to moderate mitral stenosis) and mild MR, large PFO, normal IVC. ?7. PFO: noted on 8/22 echo.  ?8. OA: Knees, C-spine. TKR 9/13.  ?9. HTN ?10. Hyperlipidemia: Myalgias with atorvastatin and Crestor. Now on Praluent.  ?11. Diverticulosis ?12. Mitral regurgitation: TEE (6/17) with EF 55-60%, severe eccentric MR, prolapse of a portion of the anterior leaflet, peak RV-RA gradient 45 mmHg.  ?- Mitral valve repair 8/17 ?- Echo in 8/22 with repaired MV, mean gradient 8 mmHg (mild-moderate MS), mild MR.  ?13. CKD: Stage III.  ?14. Atrial fibrillation/atrial flutter: Both arrhythmias noted in 12/18.  DCCV 12/19.   ?-  DCCV to NSR 1/21.  ?- DCCV to NSR 2/21.  ?15. AKI 6/19 => CKD stage 4: uncertain etiology.  Renal US was normal.  ?16. Gout ?17. COVID-19 infection 9/21 ?18. 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  ?- Carotid dopplers (6/22) with mild disease only.  ?19. Nephrolithiasis.  ?20. Atrial tachycardia: Noted on 3/22 Zio patch.  ?21. Aortic stenosis: mild-moderate on 2/22 echo but no significant stenosis on 8/22 echo.  ?22. 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  TEE 7/22 with oscillating density attached to the mitral annulus, ?suture.  However, could not rule out vegetation so had 6 wks of IV abx for SBE. ? 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  Lives in [place].  ? ?Family History  ?Problem Relation Age of Onset  ? Other Mother   ?     joint problems  ? Alzheimer's disease Mother   ? Hypertension Father   ? Heart disease Father   ? CVA Father   ?     age 5  ? Depression Father   ? Heart disease Sister   ?     CABG  ? Stroke Sister   ? Heart failure Sister   ?     congestive  ? Diabetes Sister   ?  Heart disease Brother   ? Hypertension Brother   ? Congestive Heart Failure Brother   ? ? ?ROS: All systems reviewed and negative except as per HPI.  ? ? ?Current Outpatient Medications  ?Medication Sig D

## 2021-12-25 DIAGNOSIS — E291 Testicular hypofunction: Secondary | ICD-10-CM | POA: Diagnosis not present

## 2021-12-26 ENCOUNTER — Telehealth (HOSPITAL_COMMUNITY): Payer: Self-pay | Admitting: Cardiology

## 2021-12-26 MED ORDER — EZETIMIBE 10 MG PO TABS: 10.0000 mg | ORAL_TABLET | Freq: Every day | ORAL | 3 refills | Status: DC

## 2021-12-26 NOTE — Telephone Encounter (Signed)
Patient called.  Patient aware.  

## 2021-12-26 NOTE — Telephone Encounter (Signed)
-----   Message from [name], MD sent at 12/18/2021  9:26 PM EDT ----- ?Goal LDL < 70.  He should be taking Praluent.  Would have him add Zetia 10 mg daily with lipids/LFTs in 2 months.  ?

## 2022-01-06 ENCOUNTER — Other Ambulatory Visit: Payer: Self-pay | Admitting: *Deleted

## 2022-01-06 DIAGNOSIS — N185 Chronic kidney disease, stage 5: Secondary | ICD-10-CM | POA: Diagnosis not present

## 2022-01-06 NOTE — Patient Outreach (Signed)
[facility]) Care Management ? ?01/06/2022 ? ?[name] ?[birth year] ?[id number] ? ? ?Outgoing call placed to member, no answer.  HIPAA compliant voice message left, will follow up within the next 2-3 business days. ? ?Valente David, RN, MSN, CCM ?[facility] Care Management  ?Community Care Manager ?[phone] ? ?

## 2022-01-08 ENCOUNTER — Other Ambulatory Visit: Payer: Self-pay | Admitting: *Deleted

## 2022-01-08 NOTE — Patient Outreach (Signed)
[facility]) Care Management Telephonic RN Care Manager Note   01/08/2022 Name:  [name] MRN:  [id number] DOB:  1945-12-26  Summary: Outreach attempt #2, successful to member.  Denies any urgent concerns, encouraged to contact this care manager with questions.    Recommendations/Changes made from today's visit: Monitor weight, HR, and BP daily and record readings.   Subjective: [name] is an 76 y.o. year old male who is a primary patient of [name], MD. The care management team was consulted for assistance with care management and/or care coordination needs.    Telephonic RN Care Manager completed Telephone Visit today.  Objective:   Medications Reviewed Today     Reviewed by Stanford Scotland, RN (Registered Nurse) on 12/18/21 at 1201  Med List Status: <None>   Medication Order Taking? Sig Documenting Provider Last Dose Status Informant  acetaminophen (TYLENOL) 325 MG tablet [id number] Yes Take 2 tablets (650 mg total) by mouth every 6 (six) hours as needed for mild pain (or Fever >/= 101). [name], MD Taking Active Multiple Informants  Alirocumab (PRALUENT) 75 MG/ML Darden Palmer [id number] Yes Inject 75 mg into the skin every 14 (fourteen) days. [provider] Taking Active Multiple Informants  allopurinol (ZYLOPRIM) 100 MG tablet [id number] Yes Take 0.5 tablets (50 mg total) by mouth 2 (two) times a week. Please discuss dosing with your PCP based on your renal function. [name], MD Taking Active Multiple Informants  amiodarone (PACERONE) 200 MG tablet [id number] Yes Take 0.5 tablets (100 mg total) by mouth daily. [place], [name], FNP Taking Active Multiple Informants  apixaban (ELIQUIS) 5 MG TABS tablet [id number] Yes Take 1 tablet (5 mg total) by mouth 2 (two) times daily. [name], MD Taking Active   Ascorbic Acid (VITAMIN C WITH ROSE HIPS) 1000 MG tablet [id number] Yes Take 1,000 mg by mouth daily.  [provider] Taking Active Multiple Informants  cetirizine (ZYRTEC) 10 MG tablet [id number] Yes Take 10 mg by mouth daily. [provider] Taking Active Multiple Informants  colchicine 0.6 MG tablet [id number] Yes Take 0.6-1.2 mg by mouth 2 (two) times daily as needed (gout).  [provider] Taking Active Multiple Informants  cycloSPORINE (RESTASIS) 0.05 % ophthalmic emulsion [id number] Yes Place 1 drop into both eyes 2 (two) times daily. As needed [provider] Taking Active   diclofenac Sodium (VOLTAREN) 1 % GEL [id number] Yes Apply 2 g topically 3 (three) times daily as needed for pain. [provider] Taking Active Multiple Informants  EFUDEX 5 % cream [id number] Yes Apply 1 application topically daily. [provider] Taking Active Multiple Informants  fluticasone (FLONASE) 50 MCG/ACT nasal spray [id number] Yes Place 1 spray into both nostrils daily as needed for allergies or rhinitis. [provider] Taking Active Multiple Informants  furosemide (LASIX) 40 MG tablet [id number] Yes Take 40 mg by mouth daily. [provider] Taking Active Multiple Informants  HYDROcodone-acetaminophen (NORCO/VICODIN) 5-325 MG tablet [id number] Yes Take 1 tablet by mouth as needed. [provider] Taking Active   methocarbamol (ROBAXIN) 500 MG tablet [id number] Yes Take 500 mg by mouth 3 (three) times daily as needed for muscle spasms. [provider] Taking Active Multiple Informants  Multiple Vitamin (MULTIVITAMIN) capsule [id number] Yes Take 1 capsule by mouth daily. [provider] Taking Active Multiple Informants  nitroGLYCERIN (NITROSTAT) 0.4 MG SL tablet [id number] Yes 1 TAB UNDER THE TONGUE EVERY 5 MINUTES AS NEEDED FOR CHEST PAIN UP  TO 3 DOSES, IF PERSIST CALL 911 [name], MD Taking Active   [facility] VERIO test strip [id number] Yes 1 each daily. [provider] Taking Active Multiple Informants   pantoprazole (PROTONIX) 40 MG tablet [id number] Yes Take 40 mg by mouth daily as needed (heartburn). [provider] Taking Active Multiple Informants  Semaglutide (OZEMPIC, 2 MG/DOSE, [place]) [id number] Yes Inject 0.5 mg into the skin once a week. [provider] Taking Active   sildenafil (VIAGRA) 100 MG tablet [id number] Yes Take 50 mg by mouth daily as needed for erectile dysfunction. [provider] Taking Active Multiple Informants  traZODone (DESYREL) 100 MG tablet [id number] Yes Take 100 mg by mouth at bedtime as needed for sleep. [provider] Taking Active Multiple Informants  Med List Note Annamaria Boots, [place], MD 04/13/11 2118): CPAP             SDOH:  (Social Determinants of Health) assessments and interventions performed:     Care Plan  Review of patient past medical history, allergies, medications, health status, including review of consultants reports, laboratory and other test data, was performed as part of comprehensive evaluation for care management services.   Care Plan : General Plan of Care (Adult)  Updates made by Valente David, RN since 01/08/2022 12:00 AM     Problem: Management of chronic medical conditions (HTN, CHF, A-fib)   Priority: High     Long-Range Goal: Member will be able to adequately verbalize plan of care for management of chronic medical conditions   Start Date: 08/07/2021  Expected End Date: 08/07/2022  This Visit's Progress: On track  Recent Progress: On track  Priority: High  Note:   Current Barriers:  Chronic Disease Management support and education needs related to Atrial Fibrillation, CHF, and HTN  RNCM Clinical Goal(s):  Patient will verbalize understanding of plan for management of Atrial Fibrillation, CHF, and HTN as evidenced by verbalizing plan of care take all medications exactly as prescribed and will call provider for medication related questions as evidenced by member reported adherence     attend all scheduled medical appointments: Neurology, PCP, cardiology as evidenced by Member reported adherence        continue to work with RN Care Manager and/or Social Worker to address care management and care coordination needs related to Atrial Fibrillation, CHF, and HTN as evidenced by adherence to CM Team Scheduled appointments     through collaboration with RN Care manager, provider, and care team.   Interventions: Inter-disciplinary care team collaboration (see longitudinal plan of care) Evaluation of current treatment plan related to  self management and patient's adherence to plan as established by provider   A-fib:  (Status: Goal on Track (progressing): YES.) Long Term Goal  Counseled on increased risk of stroke due to Afib and benefits of anticoagulation for stroke prevention           Reviewed importance of adherence to anticoagulant exactly as prescribed Afib action plan reviewed Screening for signs and symptoms of depression related to chronic disease state  Heart Failure Interventions:  (Status: Goal on Track (progressing): YES.)  Long Term Goal  Provided education on low sodium diet Reviewed Heart Failure Action Plan in depth and provided written copy Discussed importance of daily weight and advised patient to weigh and record daily  Hypertension: (Status: Condition stable. Not addressed this visit.) Last practice recorded BP readings:  BP Readings from Last 3 Encounters:  12/18/21 128/70  10/30/21 120/84  09/12/21 118/64  Most  recent eGFR/CrCl: No results found for: EGFR  No components found for: CRCL  Provided education to patient re: stroke prevention, s/s of heart attack and stroke; Reviewed medications with patient and discussed importance of compliance;  Discussed plans with patient for ongoing care management follow up and provided patient with direct contact information for care management team;  Patient Goals/Self-Care Activities: Attend all scheduled  provider appointments Call provider office for new concerns or questions  call office if I gain more than 2 pounds in one day or 5 pounds in one week weigh myself daily develop a rescue plan follow rescue plan if symptoms flare-up - make a plan to eat healthy choose a place to take my blood pressure (home, clinic or office, retail store) take blood pressure log to all doctor appointments   Update 1/12 - Wife report member had been doing well with managing chronic medical conditions but was hospitalized 1/5-1/11 for perianal abscess requiring I&D.  He is now back home with home health for nursing and wound care.  Wife verbalize how continuing to manage chronic medical conditions, especially diabetes, will help with wound healing.  So far, she report member's blood pressure, weights, and HR have remained stable.  Will get back into the habit or daily monitoring as he was just discharged yesterday.  He is taking antibiotics, otherwise no changes in medications.     Update 2/10 - Member report he is doing much better.  Wound is healed, released from home health last week.  He was also seen by PCP last Monday, denies any major concerns, will see again in 3 months.  Continues to monitor blood pressure, weights, and blood sugar daily.  Weight range 189-190, denies swelling or shortness of breath.  Blood sugar yesterday was 118, has not checked today yet.  Will follow up with endocrinology on 3/9.  Recent visit with cardiology on 1/20, state he will no longer have to visit regular cardiologist as he is being seen at the heart failure clinic.  Last office visit there was on October with instructions to follow up in 6 months.  Next visit not on schedule, call placed to HF clinic, requested to have call directly to member to schedule next visit.    Update 3/14 - Member state he is doing well with the exception of some intermittent days with decreased energy.  Report blood pressure, heart rate, weight, and  blood sugars have all remained stable, denies any consistent abnormal readings.  Also denies any swelling or shortness of breath.  Has upcoming appointment with HF clinic on 4/27, report he is taking all medications as instructed without complications.  Continues to adhere to low salt/heart healthy diet.     Update 4/11 - Member not available, spoke with wife.  She report that he has continued to have episodes of decreased energy, stating "I just don't feel good" and unable to elaborate on feelings.  He has not had any shortness of breath, swelling, or chest discomfort.  Blood pressure and heart has been reported normal, weight stable.  She is concerned that he may have symptoms anemia, which he has had before.  She is trying to get him to agree to a office visit with his provider but he has not yet agreed.  He has appointment with heart failure clinic on 4/27, advised wife to schedule with PCP as he does not have follow up until next month.  She verbalizes understanding and will contact this care manager if he does not  agree.   Update 4/21 - Spoke with member and wife, member state he was feeling better, more energy however he has since broken a rib.  He felt well enough to work on his boat but injured himself leaning over the side for a period of time. State has been seen by provider, xray completed, waiting on results.  He was prescribed hydrocodone for the pain, using that as instructed as well as heat for pain relief.  Will return to provider in 4 months or earlier if needed.  Verbalizes understanding that as he is getting older and have more issues with his health, he will have some good days and some bad ones.  Will notify provider if condition get worse.    Update 5/18 - Member state his pain is much better, recovering from incident where he was hurt cleaning his boat.  Report he has come to the realization that he isn't as young as he used to be.  Was seen by the heart failure clinic on 4/27,  vitals were stable, referral was sent to Prep program at [facility], he will begin classes next month.  Admits that he has been adhering to diet, checking blood sugar (A1C <6%) but has not been monitoring weights, HR, or BP daily.  State it is all usually normal when he is with the providers.  Discussed importance of home monitoring, he verbalizes understanding.  Denies any swelling, shortness of breath, chest pain or palpitations.         Plan:  Telephone follow up appointment with care management team member scheduled for:  1 month The patient has been provided with contact information for the care management team and has been advised to call with any health related questions or concerns.   Valente David, RN, MSN, [place] Manager [phone]

## 2022-01-15 DIAGNOSIS — E291 Testicular hypofunction: Secondary | ICD-10-CM | POA: Diagnosis not present

## 2022-01-20 NOTE — Progress Notes (Signed)
[facility] PREP Evaluation  Patient Details  Name: [name] MRN: [id number] Date of Birth: [birth year] Age: 76 y.o. PCP: [name], MD  Vitals:   01/20/22 1000  BP: 110/64  Pulse: 62  SpO2: 99%  Weight: 195 lb 3.2 oz (88.5 kg)     [facility] Eval - 01/20/22 1100       [facility] "PREP" Location   [facility] "PREP" Location [facility]      Referral    Referring Provider Aundra Dubin    Reason for referral Diabetes;Heart Failure;Hypertension;Stroke;Inactivity;Other   AFIB, CKD4, AFIB   Program Start Date 01/26/22   M/w 1030am to1145am x 12 wks     Measurement   Waist Circumference 41 inches    Hip Circumference 39.5 inches    Body fat 32.3 percent      Information for Trainer   Goals walk better, more strength, more endurance    Current Exercise walks intermittelntly    Orthopedic Concerns Right foot drop, R TKR    Pertinent Medical History Hx of covid x 3, CHF, CKD4, DM2, S/P CABG, Afib, stroke, HTN    Current Barriers none    Restrictions/Precautions Fall risk;Diabetic snack before exercise;Assistive device    Medications that affect exercise Beta blocker;Medication causing dizziness/drowsiness      Timed Up and Go (TUGS)   Timed Up and Go Moderate risk 10-12 seconds      Mobility and Daily Activities   I find it easy to walk up or down two or more flights of stairs. 1    I have no trouble taking out the trash. 4    I do housework such as vacuuming and dusting on my own without difficulty. 1    I can easily lift a gallon of milk (8lbs). 4    I can easily walk a mile. 4    I have no trouble reaching into high cupboards or reaching down to pick up something from the floor. 4    I do not have trouble doing out-door work such as Armed forces logistics/support/administrative officer, raking leaves, or gardening. 3      Mobility and Daily Activities   I feel younger than my age. 4    I feel independent. 4    I feel energetic. 1    I live an active life.  4    I feel strong. 2    I feel healthy. 4    I feel  active as other people my age. 2      How fit and strong are you.   Fit and Strong Total Score 42            Past Medical History:  Diagnosis Date   Allergic rhinitis    Anxiety    Atrial fibrillation (HCC)    Cancer (HCC)    hx of skin cancers    Chronic kidney disease    Coronary artery disease    a. s/p MI & CABG; b. s/p PCI Diag;  c. Cath 12/2011: LAD diff dzs/small, Diag 75% isr, 90 dist to stent (small), LCX & RCA occluded, VG->OM 40, VG->PDA patent - med rx.   COVID-19    x 3   Depression    PTSD   Diabetes mellitus    not on medications   Diastolic CHF, chronic ([place])    a. EF 55-60% by echo 2007   GERD (gastroesophageal reflux disease)    "not anymore" " I had a lap band"  History of diverticulitis of colon    5 YRS AGO   History of pneumonia    2017   History of transient ischemic attack (TIA)    CAROTID DOPPLERS NOV 2011  0-39& BIL. 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   Complex valvuloplasty including artificial Gore-tex neochord placement x4, plication of anterior commissure, and 26 mm Sorin Memo 3D ring annuloplasty via right mini thoracotomy approach   Shortness of breath dyspnea    with exertion   Skin cancer    Sleep apnea    cpap- see ov note in EPIC 05/12/11 for settings    Stroke [facility])    hx of   Stroke [facility])    Past Surgical History:  Procedure Laterality Date   AV FISTULA PLACEMENT Left 08/02/2018   Procedure: ARTERIOVENOUS (AV) FISTULA CREATION RADIOCEPHALIC;  Surgeon: Angelia Mould, MD;  Location: [place];  Service: Vascular;  Laterality: Left;   BLEPHAROPLASTY  [place]  2001, 2004, 2009, 2011   CARDIAC CATHETERIZATION N/A 01/23/2016   Procedure: Right/Left Heart Cath and Coronary Angiography;  Surgeon: [name], MD;  Location: [place] CV LAB;  Service: Cardiovascular;  Laterality: N/A;   CARDIOVERSION N/A 09/07/2017   Procedure: CARDIOVERSION;  Surgeon: [name], MD;  Location: [facility] ENDOSCOPY;  Service: Cardiovascular;  Laterality: N/A;   CARDIOVERSION N/A 09/13/2019   Procedure: CARDIOVERSION;  Surgeon: [name], MD;  Location: [facility] ENDOSCOPY;  Service: Cardiovascular;  Laterality: N/A;   CARDIOVERSION N/A 10/19/2019   Procedure: CARDIOVERSION;  Surgeon: [name], MD;  Location: [facility] ENDOSCOPY;  Service: Cardiovascular;  Laterality: N/A;   CATARACT EXTRACTION W/ INTRAOCULAR LENS IMPLANT Bilateral    CHONDROPLASTY  07/22/2011   Procedure: CHONDROPLASTY;  Surgeon: Dione Plover Aluisio;  Location: [place];  Service: Orthopedics;;   CIRCUMCISION  [place]-- POST CABG   W/ STENT, last cath 01/07/2012    CORONARY ARTERY BYPASS GRAFT  1995   X3 VESSEL   CORONARY ARTERY BYPASS GRAFT  1995   CORONARY STENT PLACEMENT     CYSTOSCOPY/URETEROSCOPY/HOLMIUM LASER/STENT PLACEMENT Right 01/08/2021   Procedure: CYSTOSCOPY/RETROGRADE/URETEROSCOPY/HOLMIUM LASER/STENT PLACEMENT;  Surgeon: Franchot Gallo, MD;  Location: [facility];  Service: Urology;  Laterality: Right;   INCISION AND DRAINAGE PERIRECTAL ABSCESS N/A 09/01/2021   Procedure: IRRIGATION AND DEBRIDEMENT PERIRECTAL ABSCESS;  Surgeon: Kieth Brightly Arta Bruce, MD;  Location: [facility];  Service: General;  Laterality: N/A;   IR PERC TUN PERIT CATH WO PORT S&I D[place]a [place]  03/06/2021   IR REMOVAL TUN CV CATH W/O FL  04/14/2021   KNEE ARTHROSCOPY  07/22/2011   Procedure: ARTHROSCOPY KNEE;  Surgeon: [name];  Location: [place];  Service: Orthopedics;  Laterality: Right;  WITH DEBRIDEMENT    KNEE ARTHROSCOPY  W/ MENISCECTOMY  X2 IN 2002-- RIGHT KNEE   LAPAROSCOPIC GASTRIC BANDING  01-15-09   LEFT HEART CATH AND CORONARY ANGIOGRAPHY N/A 04/29/2017   Procedure: LEFT HEART CATH AND CORONARY ANGIOGRAPHY;  Surgeon: [name], MD;  Location: [place] CV LAB;  Service: Cardiovascular;  Laterality: N/A;   LEFT HEART CATHETERIZATION WITH CORONARY ANGIOGRAM N/A 09/11/2013   Procedure: LEFT HEART CATHETERIZATION WITH CORONARY ANGIOGRAM;  Surgeon: [name], MD;  Location: [facility] CATH LAB;  Service: Cardiovascular;  Laterality: N/A;   MITRAL VALVE REPAIR Right 03/25/2016   Procedure: MINIMALLY INVASIVE REOPERATION FOR MITRAL VALVE REPAIR  (MVR) with size 26 Sorin Memo 3D;  Surgeon: [name], MD;  Location: [place];  Service: Open Heart Surgery;  Laterality: Right;   MITRAL VALVE REPAIR  03/2016   RIGHT KNEE ED COMPARTMENT REPLACEMENT  2004   SYNOVECTOMY  07/22/2011   Procedure: SYNOVECTOMY;  Surgeon: Dione Plover Aluisio;  Location: [place];  Service: Orthopedics;;   TEE WITHOUT CARDIOVERSION N/A 01/23/2016   Procedure: TRANSESOPHAGEAL ECHOCARDIOGRAM (TEE);  Surgeon: [name], MD;  Location: [place];  Service: Cardiovascular;  Laterality: N/A;   TEE WITHOUT CARDIOVERSION N/A 03/25/2016   Procedure: TRANSESOPHAGEAL ECHOCARDIOGRAM (TEE);  Surgeon: [name], MD;  Location: [place];  Service: Open Heart Surgery;  Laterality: N/A;   TEE WITHOUT CARDIOVERSION N/A 03/04/2021   Procedure: TRANSESOPHAGEAL ECHOCARDIOGRAM (TEE);  Surgeon: Lelon Perla, MD;  Location: [place];  Service: Cardiovascular;  Laterality: N/A;   UMBILICAL HERNIA REPAIR  01-15-09   W/ GASTRIC BANDING PROCEDURE   Social History   Tobacco Use  Smoking Status Never  Smokeless Tobacco Never    Barnett Hatter 01/20/2022, 11:28 AM

## 2022-01-28 NOTE — Progress Notes (Signed)
[facility] PREP Weekly Session  Patient Details  Name: [name] MRN: [id number] Date of Birth: 08/02/1946 Age: 76 y.o. PCP: [name], MD  Vitals:   01/26/22 1030  Weight: 201 lb 12.8 oz (91.5 kg)     [facility] Weekly seesion - 01/28/22 1000       [facility] "PREP" Location   [facility] "PREP" Location [facility]      Weekly Session   Topic Discussed Goal setting and welcome to the program   Scale of percevied exertion   Classes attended to date [place] 01/28/2022, 10:05 AM

## 2022-02-03 NOTE — Progress Notes (Signed)
[facility] PREP Weekly Session  Patient Details  Name: [name] MRN: [id number] Date of Birth: 1945/11/09 Age: 76 y.o. PCP: [name], MD  Vitals:   02/02/22 1030  Weight: 195 lb (88.5 kg)     [facility] Weekly seesion - 02/03/22 1800       [facility] "PREP" Location   [facility] "PREP" Location [facility]      Weekly Session   Topic Discussed Importance of resistance training;Other ways to be active    Minutes exercised this week 60 minutes    Classes attended to date [place] 02/03/2022, 6:02 PM

## 2022-02-04 DIAGNOSIS — L0291 Cutaneous abscess, unspecified: Secondary | ICD-10-CM | POA: Diagnosis not present

## 2022-02-06 ENCOUNTER — Other Ambulatory Visit: Payer: Self-pay | Admitting: *Deleted

## 2022-02-06 NOTE — Patient Outreach (Signed)
[facility]) Care Management  02/06/2022  [name] 03-09-1946 [id number]   Outgoing call placed to member/wife, no answer, HIPAA compliant voice message left. Will follow up within the next 2-3 business days.  Valente David, RN, MSN, [place] Manager [phone]

## 2022-02-10 ENCOUNTER — Other Ambulatory Visit: Payer: Self-pay | Admitting: *Deleted

## 2022-02-10 NOTE — Progress Notes (Signed)
[facility] PREP Weekly Session  Patient Details  Name: [name] MRN: [id number] Date of Birth: September 29, 1945 Age: 76 y.o. PCP: [name], MD  Vitals:   02/09/22 1030  Weight: 184 lb (83.5 kg)     [facility] Weekly seesion - 02/10/22 0900       [facility] "PREP" Location   [facility] "PREP" [facility]      Weekly Session   Topic Discussed Healthy eating tips    Minutes exercised this week 145 minutes    Classes attended to date [place] 02/10/2022, 9:57 AM

## 2022-02-10 NOTE — Patient Outreach (Addendum)
[facility]) Care Management Telephonic RN Care Manager Note   02/10/2022 Name:  [name] MRN:  [id number] DOB:  1946-01-30  Summary: Outreach attempt #2, successful to wife.  Denies any urgent concerns, encouraged to contact this care manager with questions.     Subjective: [name] is an 76 y.o. year old male who is a primary patient of [name], MD. The care management team was consulted for assistance with care management and/or care coordination needs.    Telephonic RN Care Manager completed Telephone Visit today.  Objective:   Medications Reviewed Today     Reviewed by Stanford Scotland, RN (Registered Nurse) on 12/18/21 at 1201  Med List Status: <None>   Medication Order Taking? Sig Documenting Provider Last Dose Status Informant  acetaminophen (TYLENOL) 325 MG tablet [id number] Yes Take 2 tablets (650 mg total) by mouth every 6 (six) hours as needed for mild pain (or Fever >/= 101). [name], MD Taking Active Multiple Informants  Alirocumab (PRALUENT) 75 MG/ML Darden Palmer [id number] Yes Inject 75 mg into the skin every 14 (fourteen) days. [provider] Taking Active Multiple Informants  allopurinol (ZYLOPRIM) 100 MG tablet [id number] Yes Take 0.5 tablets (50 mg total) by mouth 2 (two) times a week. Please discuss dosing with your PCP based on your renal function. [name], MD Taking Active Multiple Informants  amiodarone (PACERONE) 200 MG tablet [id number] Yes Take 0.5 tablets (100 mg total) by mouth daily. [place], [name], FNP Taking Active Multiple Informants  apixaban (ELIQUIS) 5 MG TABS tablet [id number] Yes Take 1 tablet (5 mg total) by mouth 2 (two) times daily. [name], MD Taking Active   Ascorbic Acid (VITAMIN C WITH ROSE HIPS) 1000 MG tablet [id number] Yes Take 1,000 mg by mouth daily. [provider] Taking Active Multiple Informants  cetirizine (ZYRTEC) 10 MG tablet [id number] Yes Take 10  mg by mouth daily. [provider] Taking Active Multiple Informants  colchicine 0.6 MG tablet [id number] Yes Take 0.6-1.2 mg by mouth 2 (two) times daily as needed (gout).  [provider] Taking Active Multiple Informants  cycloSPORINE (RESTASIS) 0.05 % ophthalmic emulsion [id number] Yes Place 1 drop into both eyes 2 (two) times daily. As needed [provider] Taking Active   diclofenac Sodium (VOLTAREN) 1 % GEL [id number] Yes Apply 2 g topically 3 (three) times daily as needed for pain. [provider] Taking Active Multiple Informants  EFUDEX 5 % cream [id number] Yes Apply 1 application topically daily. [provider] Taking Active Multiple Informants  fluticasone (FLONASE) 50 MCG/ACT nasal spray [id number] Yes Place 1 spray into both nostrils daily as needed for allergies or rhinitis. [provider] Taking Active Multiple Informants  furosemide (LASIX) 40 MG tablet [id number] Yes Take 40 mg by mouth daily. [provider] Taking Active Multiple Informants  HYDROcodone-acetaminophen (NORCO/VICODIN) 5-325 MG tablet [id number] Yes Take 1 tablet by mouth as needed. [provider] Taking Active   methocarbamol (ROBAXIN) 500 MG tablet [id number] Yes Take 500 mg by mouth 3 (three) times daily as needed for muscle spasms. [provider] Taking Active Multiple Informants  Multiple Vitamin (MULTIVITAMIN) capsule [id number] Yes Take 1 capsule by mouth daily. [provider] Taking Active Multiple Informants  nitroGLYCERIN (NITROSTAT) 0.4 MG SL tablet [id number] Yes 1 TAB UNDER THE TONGUE EVERY 5 MINUTES AS NEEDED FOR CHEST PAIN UP TO 3 DOSES, IF PERSIST CALL 911 [name], MD Taking Active  ONETOUCH VERIO test strip [id number] Yes 1 each daily. [provider] Taking Active Multiple Informants  pantoprazole (PROTONIX) 40 MG tablet [id number] Yes Take 40 mg by mouth daily as needed (heartburn). [provider] Taking Active Multiple Informants  Semaglutide (OZEMPIC, 2 MG/DOSE, [place]) [id number] Yes Inject 0.5 mg into the skin once a week. [provider] Taking Active   sildenafil (VIAGRA) 100 MG tablet [id number] Yes Take 50 mg by mouth daily as needed for erectile dysfunction. [provider] Taking Active Multiple Informants  traZODone (DESYREL) 100 MG tablet [id number] Yes Take 100 mg by mouth at bedtime as needed for sleep. [provider] Taking Active Multiple Informants  Med List Note Annamaria Boots, [place], MD 04/13/11 2118): CPAP             SDOH:  (Social Determinants of Health) assessments and interventions performed:     Care Plan  Review of patient past medical history, allergies, medications, health status, including review of consultants reports, laboratory and other test data, was performed as part of comprehensive evaluation for care management services.   Care Plan : General Plan of Care (Adult)  Updates made by Valente David, RN since 02/10/2022 12:00 AM     Problem: Management of chronic medical conditions (HTN, CHF, A-fib)   Priority: High     Long-Range Goal: Member will be able to adequately verbalize plan of care for management of chronic medical conditions Completed 02/10/2022  Start Date: 08/07/2021  Expected End Date: 08/07/2022  This Visit's Progress: On track  Recent Progress: On track  Priority: High  Note:   Current Barriers:  Chronic Disease Management support and education needs related to Atrial Fibrillation, CHF, and HTN  RNCM Clinical Goal(s):  Patient will verbalize understanding of plan for management of Atrial Fibrillation, CHF, and HTN as evidenced by verbalizing plan of care take all medications exactly as prescribed and will call provider for medication related questions as evidenced by member reported adherence    attend all scheduled medical appointments: Neurology, PCP, cardiology as evidenced by Member  reported adherence        continue to work with RN Care Manager and/or Social Worker to address care management and care coordination needs related to Atrial Fibrillation, CHF, and HTN as evidenced by adherence to CM Team Scheduled appointments     through collaboration with RN Care manager, provider, and care team.   Interventions: Inter-disciplinary care team collaboration (see longitudinal plan of care) Evaluation of current treatment plan related to  self management and patient's adherence to plan as established by provider   A-fib:  (Status: Goal Met.) Long Term Goal  Counseled on increased risk of stroke due to Afib and benefits of anticoagulation for stroke prevention           Reviewed importance of adherence to anticoagulant exactly as prescribed Afib action plan reviewed Screening for signs and symptoms of depression related to chronic disease state  Heart Failure Interventions:  (Status: Goal Met.)  Long Term Goal  Provided education on low sodium diet Reviewed Heart Failure Action Plan in depth and provided written copy Discussed importance of daily weight and advised patient to weigh and record daily  Hypertension: (Status: Goal Met.) Last practice recorded BP readings:  BP Readings from Last 3 Encounters:  01/20/22 110/64  12/18/21 128/70  10/30/21 120/84  Most recent eGFR/CrCl: No results found for: EGFR  No components found for: CRCL  Provided education to patient re: stroke prevention, s/s of heart  attack and stroke; Reviewed medications with patient and discussed importance of compliance;  Discussed plans with patient for ongoing care management follow up and provided patient with direct contact information for care management team;  Patient Goals/Self-Care Activities: Attend all scheduled provider appointments Call provider office for new concerns or questions  call office if I gain more than 2 pounds in one day or 5 pounds in one week weigh myself daily develop  a rescue plan follow rescue plan if symptoms flare-up - make a plan to eat healthy choose a place to take my blood pressure (home, clinic or office, retail store) take blood pressure log to all doctor appointments   Update 1/12 - Wife report member had been doing well with managing chronic medical conditions but was hospitalized 1/5-1/11 for perianal abscess requiring I&D.  He is now back home with home health for nursing and wound care.  Wife verbalize how continuing to manage chronic medical conditions, especially diabetes, will help with wound healing.  So far, she report member's blood pressure, weights, and HR have remained stable.  Will get back into the habit or daily monitoring as he was just discharged yesterday.  He is taking antibiotics, otherwise no changes in medications.     Update 2/10 - Member report he is doing much better.  Wound is healed, released from home health last week.  He was also seen by PCP last Monday, denies any major concerns, will see again in 3 months.  Continues to monitor blood pressure, weights, and blood sugar daily.  Weight range 189-190, denies swelling or shortness of breath.  Blood sugar yesterday was 118, has not checked today yet.  Will follow up with endocrinology on 3/9.  Recent visit with cardiology on 1/20, state he will no longer have to visit regular cardiologist as he is being seen at the heart failure clinic.  Last office visit there was on October with instructions to follow up in 6 months.  Next visit not on schedule, call placed to HF clinic, requested to have call directly to member to schedule next visit.    Update 3/14 - Member state he is doing well with the exception of some intermittent days with decreased energy.  Report blood pressure, heart rate, weight, and blood sugars have all remained stable, denies any consistent abnormal readings.  Also denies any swelling or shortness of breath.  Has upcoming appointment with HF clinic on 4/27,  report he is taking all medications as instructed without complications.  Continues to adhere to low salt/heart healthy diet.     Update 4/11 - Member not available, spoke with wife.  She report that he has continued to have episodes of decreased energy, stating "I just don't feel good" and unable to elaborate on feelings.  He has not had any shortness of breath, swelling, or chest discomfort.  Blood pressure and heart has been reported normal, weight stable.  She is concerned that he may have symptoms anemia, which he has had before.  She is trying to get him to agree to a office visit with his provider but he has not yet agreed.  He has appointment with heart failure clinic on 4/27, advised wife to schedule with PCP as he does not have follow up until next month.  She verbalizes understanding and will contact this care manager if he does not agree.   Update 4/21 - Spoke with member and wife, member state he was feeling better, more energy however he has since broken  a rib.  He felt well enough to work on his boat but injured himself leaning over the side for a period of time. State has been seen by provider, xray completed, waiting on results.  He was prescribed hydrocodone for the pain, using that as instructed as well as heat for pain relief.  Will return to provider in 4 months or earlier if needed.  Verbalizes understanding that as he is getting older and have more issues with his health, he will have some good days and some bad ones.  Will notify provider if condition get worse.    Update 5/18 - Member state his pain is much better, recovering from incident where he was hurt cleaning his boat.  Report he has come to the realization that he isn't as young as he used to be.  Was seen by the heart failure clinic on 4/27, vitals were stable, referral was sent to Prep program at [facility], he will begin classes next month.  Admits that he has been adhering to diet, checking blood sugar (A1C <6%) but has not  been monitoring weights, HR, or BP daily.  State it is all usually normal when he is with the providers.  Discussed importance of home monitoring, he verbalizes understanding.  Denies any swelling, shortness of breath, chest pain or palpitations.    Update 6/20 - Spoke with wife, state member's condition continues to improve since starting the Prep program at [facility].  She is appreciative he has the opportunity, classes on health, exercise, and nutrition.  Blood pressure and weights are monitored through program as well, per chart all have been stable.  Will follow up with PCP in the next couple weeks, with endocrinology on 7/5, and with cardiology on 8/29.  Wife to call with any questions.        Plan:  The patient has been provided with contact information for the care management team and has been advised to call with any health related questions or concerns.  No further follow up required: Member stable, case closed.  Valente David, RN, MSN, [place] Manager [phone]

## 2022-02-25 ENCOUNTER — Ambulatory Visit (INDEPENDENT_AMBULATORY_CARE_PROVIDER_SITE_OTHER): Payer: Medicare Other | Admitting: Internal Medicine

## 2022-02-25 ENCOUNTER — Encounter: Payer: Self-pay | Admitting: Internal Medicine

## 2022-02-25 VITALS — BP 126/70 | HR 71 | Ht 69.0 in | Wt 197.8 lb

## 2022-02-25 DIAGNOSIS — E782 Mixed hyperlipidemia: Secondary | ICD-10-CM | POA: Diagnosis not present

## 2022-02-25 DIAGNOSIS — E669 Obesity, unspecified: Secondary | ICD-10-CM | POA: Diagnosis not present

## 2022-02-25 DIAGNOSIS — E1159 Type 2 diabetes mellitus with other circulatory complications: Secondary | ICD-10-CM

## 2022-02-25 DIAGNOSIS — I208 Other forms of angina pectoris: Secondary | ICD-10-CM

## 2022-02-25 LAB — POCT GLYCOSYLATED HEMOGLOBIN (HGB A1C): Hemoglobin A1C: 5.9 % — AB (ref 4.0–5.6)

## 2022-02-25 MED ORDER — DEXCOM G7 RECEIVER DEVI: 1.0000 | Freq: Once | 0 refills | Status: AC

## 2022-02-25 MED ORDER — OZEMPIC (0.25 OR 0.5 MG/DOSE) 2 MG/1.5ML ~~LOC~~ SOPN: 0.5000 mg | PEN_INJECTOR | SUBCUTANEOUS | 3 refills | Status: DC

## 2022-02-25 MED ORDER — DEXCOM G7 SENSOR MISC: 3.0000 | 4 refills | Status: DC

## 2022-02-25 NOTE — Progress Notes (Signed)
Patient ID: [name], male   DOB: Sep 17, 1945, 76 y.o.   MRN: [id number]   HPI: ROMELO SCIANDRA is a 76 y.o.-year-old male, initially referred by his nephrologist, Dr. 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 fib, CKD stage V, cerebrovascular disease, Aortic atherosclerosis, s/p TIA, ED).  Last visit 4 months ago.  Interim history: He has a stroke 12/2020 - weak R side of body.  He was in physical therapy, but finished before last visit.  He is now in cardiac rehab 1 mo ago. He feels better. No blurry vision, nausea, chest pain but has chronic shortness of breath. He had Covid 3x. He has long Covid.  Reviewed HbA1c levels: Lab Results  Component Value Date   HGBA1C 6.3 (A) 10/30/2021   HGBA1C 6.4 (A) 06/19/2021   HGBA1C 6.2 (H) 02/02/2021   HGBA1C 6.3 (H) 01/07/2021   HGBA1C 6.0 (H) 10/05/2020   HGBA1C 5.9 (A) 01/29/2020   HGBA1C 7.1 (A) 09/25/2019   HGBA1C 7.1 (A) 05/23/2019   HGBA1C 8.5 (A) 02/02/2019   HGBA1C 7.2 (H) 03/23/2016  09/24/2021: HbA1c 5.8% 01/11/2019: HbA1c 8.3% 10/04/2018: HbA1c 8.2%  He is currently on: - Ozempic 0.5 mg weekly >> stopped 04/2020 >> restarted 05/2020 - through the [place]. - (Glipizide 2.5 to 5 mg as needed before a  larger dinner) - last 5-6 mo ago He came off metformin due to worsening kidney function. We stopped glipizide 09/2020 due to good control.  Pt checks his sugars once a day: - am:77, 110-135, 140, 155 >> 80-135, 140 >> 89, 107-144, 155 - 2h after b'fast: n/c >> 207 >> 90, 110 >> n/c - before lunch: n/c >> 102-130 - 2h after lunch: n/c >> 120-130 >> n/c >> 134, 165 >> n/c - before dinner: 129 >> n/c >> 86 >> n/c - 2h after dinner: n/c >> 190 >> n/c  - bedtime: n/c >> 77-122 >> n/c - nighttime: n/c Lowest sugar was 69 >> 104 >> 77 >> 80 >> 89; he has hypoglycemia awareness in the 70s. Highest sugar was 299 >>  .Marland Kitchen. 140 >> 155.  Pt's meals are: - Breakfast: egg and bacon (not lately).   - Brunch: meat + veggies - Dinner: same as lunch - Snacks: crackers, popcorn   -He has stage V CKD-sees nephrology-did not have to start dialysis yet but has a fistula placed; last BUN/creatinine:  Lab Results  Component Value Date   BUN 40 (H) 12/18/2021   BUN 35 (H) 09/02/2021   CREATININE 4.04 (H) 12/18/2021   CREATININE 3.21 (H) 09/02/2021  09/24/2021: 45/3.21, GFR 19, glucose 120 01/11/2019: 45//3.79, GFR 16, glucose 149, ACR 52.5 10/04/2018: 32/3.78, GFR 15, glucose 184 He is not on ACE inhibitor/ARB.  -+ HL; last set of lipids: Lab Results  Component Value Date   CHOL 171 12/18/2021   HDL 50 12/18/2021   LDLCALC 100 (H) 12/18/2021   TRIG 106 12/18/2021   CHOLHDL 3.4 12/18/2021  09/24/2021: 142/100/59/65 He developed generalized aches and pains from pravastatin.  Now restarted Zetia 12/2021 by cardiology.  He is currently also on Praluent.  - last eye exam was in 04-12/2021: Reported[facility] no DR ([facility]).   - no numbness and tingling in his feet. Last foot exam 09/2021.  Pt has FH of DM in sister and brother.  He also has hypertension. He has a stroke 12/2020 - weak R side of body, but  improving. He was in PT. he started to see neurology. He was admitted 02/01/2021 for chest pain. He had AKI. He has a history of lap band surgery 2010. Lost from 373 lbs to 215-220 lbs. 01/23/2021: B12 vitamin 733, TSH 1.83  ROS: C+ see HPI  I reviewed pt's medications, allergies, PMH, social hx, family hx, and changes were documented in the history of present illness. Otherwise, unchanged from my initial visit note.  Past Medical History:  Diagnosis Date   Allergic rhinitis    Anxiety    Atrial fibrillation (HCC)    Cancer (HCC)    hx of skin cancers    Chronic kidney disease    Coronary artery disease    a. s/p MI & CABG; b. s/p PCI Diag;  c. Cath 12/2011: LAD diff dzs/small, Diag 75% isr, 90 dist to stent  (small), LCX & RCA occluded, VG->OM 40, VG->PDA patent - med rx.   COVID-19    x 3   Depression    PTSD   Diabetes mellitus    not on medications   Diastolic CHF, chronic ([place])    a. EF 55-60% by echo 2007   GERD (gastroesophageal reflux disease)    "not anymore" " I had a lap band"   History of diverticulitis of colon    5 YRS AGO   History of pneumonia    2017   History of transient ischemic attack (TIA)    CAROTID DOPPLERS NOV 2011  0-39& BIL. 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/25/2016   Complex valvuloplasty including artificial Gore-tex neochord placement x4, plication of anterior commissure, and 26 mm Sorin Memo 3D ring annuloplasty via right mini thoracotomy approach   Shortness of breath dyspnea    with exertion   Skin cancer    Sleep apnea    cpap- see ov note in EPIC 05/12/11 for settings    Stroke [facility])    hx of   Stroke [facility])    Past Surgical History:  Procedure Laterality Date   AV FISTULA PLACEMENT Left 08/02/2018   Procedure: ARTERIOVENOUS (AV) FISTULA CREATION RADIOCEPHALIC;  Surgeon: Angelia Mould, MD;  Location: [place];  Service: Vascular;  Laterality: Left;   BLEPHAROPLASTY  [place]  2001, 2004, 2009, 2011   CARDIAC CATHETERIZATION N/A 01/23/2016   Procedure: Right/Left Heart Cath and Coronary Angiography;  Surgeon: [name], MD;  Location: [place] CV LAB;  Service: Cardiovascular;  Laterality: N/A;   CARDIOVERSION N/A 09/07/2017   Procedure: CARDIOVERSION;  Surgeon: [name], MD;  Location: [facility] ENDOSCOPY;  Service: Cardiovascular;  Laterality: N/A;   CARDIOVERSION  N/A 09/13/2019   Procedure: CARDIOVERSION;  Surgeon: [name], MD;  Location: [facility] ENDOSCOPY;  Service: Cardiovascular;  Laterality: N/A;   CARDIOVERSION N/A 10/19/2019   Procedure: CARDIOVERSION;  Surgeon: [name], MD;  Location: [facility] ENDOSCOPY;  Service: Cardiovascular;  Laterality: N/A;   CATARACT EXTRACTION W/ INTRAOCULAR LENS IMPLANT Bilateral    CHONDROPLASTY  07/22/2011   Procedure: CHONDROPLASTY;  Surgeon: Dione Plover Aluisio;  Location: [place]  [facility];  Service: Orthopedics;;   CIRCUMCISION  [place]-- POST CABG   W/ STENT, last cath 01/07/2012    CORONARY ARTERY BYPASS GRAFT  1995   X3 VESSEL   CORONARY ARTERY BYPASS GRAFT  1995   CORONARY STENT PLACEMENT     CYSTOSCOPY/URETEROSCOPY/HOLMIUM LASER/STENT PLACEMENT Right 01/08/2021   Procedure: CYSTOSCOPY/RETROGRADE/URETEROSCOPY/HOLMIUM LASER/STENT PLACEMENT;  Surgeon: Franchot Gallo, MD;  Location: [facility];  Service: Urology;  Laterality: Right;   INCISION AND DRAINAGE PERIRECTAL ABSCESS N/A 09/01/2021   Procedure: IRRIGATION AND DEBRIDEMENT PERIRECTAL ABSCESS;  Surgeon: Kieth Brightly Arta Bruce, MD;  Location: [facility];  Service: General;  Laterality: N/A;   IR PERC TUN PERIT CATH WO PORT S&I [place]  03/06/2021   IR REMOVAL TUN CV CATH W/O FL  04/14/2021   KNEE ARTHROSCOPY  07/22/2011   Procedure: ARTHROSCOPY KNEE;  Surgeon: [name];  Location: [place];  Service: Orthopedics;  Laterality: Right;  WITH DEBRIDEMENT    KNEE ARTHROSCOPY W/ MENISCECTOMY  X2 IN 2002-- RIGHT KNEE   LAPAROSCOPIC GASTRIC BANDING  01-15-09   LEFT HEART CATH AND CORONARY ANGIOGRAPHY N/A 04/29/2017   Procedure: LEFT HEART CATH AND CORONARY ANGIOGRAPHY;  Surgeon: [name], MD;  Location: [place] CV LAB;  Service: Cardiovascular;  Laterality: N/A;   LEFT HEART CATHETERIZATION WITH CORONARY ANGIOGRAM N/A 09/11/2013   Procedure: LEFT HEART CATHETERIZATION WITH CORONARY  ANGIOGRAM;  Surgeon: [name], MD;  Location: [facility] CATH LAB;  Service: Cardiovascular;  Laterality: N/A;   MITRAL VALVE REPAIR Right 03/25/2016   Procedure: MINIMALLY INVASIVE REOPERATION FOR MITRAL VALVE REPAIR  (MVR) with size 26 Sorin Memo 3D;  Surgeon: [name], MD;  Location: [place];  Service: Open Heart Surgery;  Laterality: Right;   MITRAL VALVE REPAIR  03/2016   RIGHT KNEE ED COMPARTMENT REPLACEMENT  2004   SYNOVECTOMY  07/22/2011   Procedure: SYNOVECTOMY;  Surgeon: Dione Plover Aluisio;  Location: [place];  Service: Orthopedics;;   TEE WITHOUT CARDIOVERSION N/A 01/23/2016   Procedure: TRANSESOPHAGEAL ECHOCARDIOGRAM (TEE);  Surgeon: [name], MD;  Location: [place];  Service: Cardiovascular;  Laterality: N/A;   TEE WITHOUT CARDIOVERSION N/A 03/25/2016   Procedure: TRANSESOPHAGEAL ECHOCARDIOGRAM (TEE);  Surgeon: [name], MD;  Location: [place];  Service: Open Heart Surgery;  Laterality: N/A;   TEE WITHOUT CARDIOVERSION N/A 03/04/2021   Procedure: TRANSESOPHAGEAL ECHOCARDIOGRAM (TEE);  Surgeon: Lelon Perla, MD;  Location: [facility] ENDOSCOPY;  Service: Cardiovascular;  Laterality: N/A;   UMBILICAL HERNIA REPAIR  01-15-09   W/ GASTRIC BANDING PROCEDURE   Social History   Socioeconomic History   Marital status: Married    Spouse name: Not on file   Number of children: 1   Years of education: Not on file   Highest education level: Not on file  Occupational History   Contractor, semi-retired  Social Needs   Financial resource strain: Not on file   Food insecurity:    Worry: Not on file    Inability: Not on file   Transportation needs:    Medical: Not on file    Non-medical: Not on file  Tobacco Use   Smoking status: Never Smoker   Smokeless tobacco: Never Used  Substance and Sexual Activity   Alcohol use: Yes, liquor    Comment: OCCASIONAL   Drug use: No   Sexual activity: Not on file  Lifestyle   Physical  activity:    Days per week:  Not on file    Minutes per session: Not on file   Stress: Not on file  Relationships   Social connections:    Talks on phone: Not on file    Gets together: Not on file    Attends religious service: Not on file    Active member of club or organization: Not on file    Attends meetings of clubs or organizations: Not on file    Relationship status: Not on file   Intimate partner violence:    Fear of current or ex partner: Not on file    Emotionally abused: Not on file    Physically abused: Not on file    Forced sexual activity: Not on file  Other Topics Concern   Not on file  Social History Narrative   Not on file   Current Outpatient Medications on File Prior to Visit  Medication Sig Dispense Refill   acetaminophen (TYLENOL) 325 MG tablet Take 2 tablets (650 mg total) by mouth every 6 (six) hours as needed for mild pain (or Fever >/= 101).     Alirocumab (PRALUENT) 75 MG/ML SOAJ Inject 75 mg into the skin every 14 (fourteen) days.     allopurinol (ZYLOPRIM) 100 MG tablet Take 0.5 tablets (50 mg total) by mouth 2 (two) times a week. Please discuss dosing with your PCP based on your renal function. 4 tablet 0   amiodarone (PACERONE) 200 MG tablet Take 0.5 tablets (100 mg total) by mouth daily. 60 tablet 0   apixaban (ELIQUIS) 5 MG TABS tablet Take 1 tablet (5 mg total) by mouth 2 (two) times daily. 180 tablet 3   Ascorbic Acid (VITAMIN C WITH ROSE HIPS) 1000 MG tablet Take 1,000 mg by mouth daily.     cetirizine (ZYRTEC) 10 MG tablet Take 10 mg by mouth daily.     colchicine 0.6 MG tablet Take 0.6-1.2 mg by mouth 2 (two) times daily as needed (gout).      cycloSPORINE (RESTASIS) 0.05 % ophthalmic emulsion Place 1 drop into both eyes 2 (two) times daily. As needed     diclofenac Sodium (VOLTAREN) 1 % GEL Apply 2 g topically 3 (three) times daily as needed for pain.     EFUDEX 5 % cream Apply 1 application topically daily.     ezetimibe (ZETIA) 10 MG tablet Take 1 tablet (10 mg total) by  mouth daily. 90 tablet 3   fluticasone (FLONASE) 50 MCG/ACT nasal spray Place 1 spray into both nostrils daily as needed for allergies or rhinitis.     furosemide (LASIX) 40 MG tablet Take 40 mg by mouth daily.     furosemide (LASIX) 80 MG tablet Take 80 mg by mouth every other day.     HYDROcodone-acetaminophen (NORCO/VICODIN) 5-325 MG tablet Take 1 tablet by mouth as needed.     methocarbamol (ROBAXIN) 500 MG tablet Take 500 mg by mouth 3 (three) times daily as needed for muscle spasms.     Multiple Vitamin (MULTIVITAMIN) capsule Take 1 capsule by mouth daily.     nitroGLYCERIN (NITROSTAT) 0.4 MG SL tablet 1 TAB UNDER THE TONGUE EVERY 5 MINUTES AS NEEDED FOR CHEST PAIN UP TO 3 DOSES, IF PERSIST CALL 911 25 tablet 3   ONETOUCH VERIO test strip 1 each daily.     pantoprazole (PROTONIX) 40 MG tablet Take 40 mg by mouth daily as needed (heartburn).     Semaglutide (OZEMPIC, 2 MG/DOSE, [place]) Inject  0.5 mg into the skin once a week.     sildenafil (VIAGRA) 100 MG tablet Take 50 mg by mouth daily as needed for erectile dysfunction.     traZODone (DESYREL) 100 MG tablet Take 100 mg by mouth at bedtime as needed for sleep.     No current facility-administered medications on file prior to visit.   Allergies  Allergen Reactions   Crestor [Rosuvastatin] Other (See Comments)    Muscle aches    Lipitor [Atorvastatin] Other (See Comments)    Muscle aches   Pravastatin Other (See Comments)    Muscle aches   Carvedilol     Other reaction(s): loss of appetite   Metformin Diarrhea   Serotonin     Other reaction(s): Other (See Comments) Other reaction(s): Muscle aches   Serotonin Reuptake Inhibitors (Ssris)     Other reaction(s): Muscle aches   Zocor [Simvastatin]     Other reaction(s): Muscle pain   Codeine Itching and Other (See Comments)    Extremity tingling-- can take synthetic   Family History  Problem Relation Age of Onset   Other Mother        joint problems   Alzheimer's disease Mother     Hypertension Father    Heart disease Father    CVA Father        age 93   Depression Father    Heart disease Sister        CABG   Stroke Sister    Heart failure Sister        congestive   Diabetes Sister    Heart disease Brother    Hypertension Brother    Congestive Heart Failure Brother     PE: BP 126/70 (BP Location: Left Arm, Patient Position: Sitting, Cuff Size: Normal)   Pulse 71   Ht '5\' 9"'$  (1.753 m)   Wt 197 lb 12.8 oz (89.7 kg)   SpO2 96%   BMI 29.21 kg/m  Wt Readings from Last 3 Encounters:  02/25/22 197 lb 12.8 oz (89.7 kg)  02/16/22 192 lb (87.1 kg)  02/09/22 184 lb (83.5 kg)   Constitutional: overweight, in NAD Eyes: PERRLA, EOMI, no exophthalmos ENT: moist mucous membranes, no thyromegaly, no cervical lymphadenopathy Cardiovascular: RRR, No MRG Respiratory: CTA B Musculoskeletal: no deformities, strength intact in all 4 Skin: moist, warm, no rashes except multiple ecchymosis on bilateral dorsum of hands Neurological: + tremor with outstretched R hand, DTR normal in all 4  ASSESSMENT: 1. DM2, non-insulin-dependent, uncontrolled, with long-term complications - CAD, s/p coronaroplasty, then CABG 1995 - cardiologist - Dr. Aundra Dubin - CHF, EF 55 to 65% - Atrial fibrillation, cardioversion in 2019 - Carotid artery disease - Cerebrovascular disease, s/p TIA, s/p stroke 12/2020 - Ao ATS per abdominal CT (08/31/2021) - CKD stage V, pending dialysis -Dr. Jimmy Footman - ED  2. HL  3.  Obesity class I  PLAN:  1. Patient with longstanding, previously uncontrolled type 2 diabetes, with improved control in the last 2.5 years.  He had to come off metformin due to the declining kidney function.  We also stopped sulfonylurea due to good control.  He only occasionally uses half of glipizide before a larger meal, but not in the last 5 to 6 months.  He is currently on a lower dose of Ozempic, which she tolerates well in which he is able to control his blood sugars fairly  well.   -At last visit we discussed about checking some sugars later in the day but he  he only checks it in the morning.  He mentions pain in his fingers.  At last visit I recommended the Dexcom CGM.  He did not obtain it yet but mentions that would like this to be called into the [place] pharmacy.  Prescription called in today. -Otherwise, based on the blood sugars, they are mostly at goal with only an occasional slightly higher blood sugar in the morning.  He still not checking later in the day.  No need to change his regimen for now in the light of his excellent HbA1c (see below). - I suggested to:  Patient Instructions  Please continue: - Ozempic 0.5 mg weekly   You can take Glipizide 2.5-5 mg before a larger dinner.  Check some sugars at night, also.  Please try to start the [place] CGM.  Please return in 6 months with your sugar log or CGM.   - we checked his HbA1c: 5.9% (lower!) - advised to check sugars at different times of the day - 4x a day, rotating check times - advised for yearly eye exams >> he is UTD - return to clinic in 6 months  2. HL -Reviewed latest lipid panel from 11/2021: LDL above target, increased from before, the rest the fractions at goal: Lab Results  Component Value Date   CHOL 171 12/18/2021   HDL 50 12/18/2021   LDLCALC 100 (H) 12/18/2021   TRIG 106 12/18/2021   CHOLHDL 3.4 12/18/2021  -He is on Zetia (restarted after the above results returned) and Praluent.  He had side effects from statins in the past.  3.  Obesity class I -We will continue Ozempic which should also help with weight loss -Before last OV, he gained 12 lbs, and since then he lost 1 pound  Philemon Kingdom, MD PhD [facility]

## 2022-02-25 NOTE — Patient Instructions (Addendum)
Please continue: - Ozempic 0.5 mg weekly   You can take Glipizide 2.5-5 mg before a larger dinner.  Check some sugars at night, also.  Please try to start the [place] CGM.  Please return in 6 months with your sugar log or CGM.

## 2022-02-26 DIAGNOSIS — E291 Testicular hypofunction: Secondary | ICD-10-CM | POA: Diagnosis not present

## 2022-03-02 ENCOUNTER — Telehealth (HOSPITAL_COMMUNITY): Payer: Self-pay | Admitting: *Deleted

## 2022-03-02 DIAGNOSIS — D171 Benign lipomatous neoplasm of skin and subcutaneous tissue of trunk: Secondary | ICD-10-CM | POA: Diagnosis not present

## 2022-03-02 DIAGNOSIS — L570 Actinic keratosis: Secondary | ICD-10-CM | POA: Diagnosis not present

## 2022-03-02 DIAGNOSIS — L821 Other seborrheic keratosis: Secondary | ICD-10-CM | POA: Diagnosis not present

## 2022-03-02 DIAGNOSIS — Z85828 Personal history of other malignant neoplasm of skin: Secondary | ICD-10-CM | POA: Diagnosis not present

## 2022-03-02 DIAGNOSIS — D692 Other nonthrombocytopenic purpura: Secondary | ICD-10-CM | POA: Diagnosis not present

## 2022-03-02 NOTE — Telephone Encounter (Signed)
Pt called stating since starting zetia he's had chest pain leading into his back and when he wakes up his has tingling in his hands. Pt said this all began when he started zetia in May.   Routed to [place]

## 2022-03-02 NOTE — Telephone Encounter (Signed)
Strange that Zetia would cause that.  Can try stopping it to see if it helps.  If does not, need to have office visit to assess.

## 2022-03-03 NOTE — Telephone Encounter (Signed)
Spoke with pts wife she is aware and will call next week with an update.

## 2022-03-03 NOTE — Progress Notes (Signed)
[facility] PREP Weekly Session  Patient Details  Name: [name] MRN: [id number] Date of Birth: [birth year] Age: 76 y.o. PCP: [name], MD  Vitals:   03/02/22 1030  Weight: 198 lb (89.8 kg)     [facility] Weekly seesion - 03/03/22 1700       [facility] "PREP" Location   [facility] "PREP" [facility]      Weekly Session   Topic Discussed Restaurant Eating    Minutes exercised this week 110 minutes    Classes attended to date [place] 03/03/2022, 5:03 PM

## 2022-03-12 NOTE — Progress Notes (Signed)
[facility] PREP Weekly Session  Patient Details  Name: [name] MRN: [id number] Date of Birth: 14-Mar-1946 Age: 76 y.o. PCP: [name], MD  Vitals:   03/09/22 1030  Weight: 193 lb (87.5 kg)     [facility] Weekly seesion - 03/12/22 1000       [facility] "PREP" Location   [facility] "PREP" Location [facility]      Weekly Session   Topic Discussed Stress management and problem solving   meditation and chair yoga   Minutes exercised this week 150 minutes    Classes attended to date [place] 03/12/2022, 10:09 AM

## 2022-03-16 NOTE — Progress Notes (Signed)
[facility] PREP Weekly Session  Patient Details  Name: [name] MRN: [id number] Date of Birth: 03/23/1946 Age: 76 y.o. PCP: [name], MD  Vitals:   03/16/22 1214  Weight: 195 lb (88.5 kg)     [facility] Weekly seesion - 03/16/22 1200       [facility] "PREP" Location   [facility] "PREP" Location [facility]      Weekly Session   Topic Discussed Expectations and non-scale victories    Minutes exercised this week 300 minutes    Classes attended to date [place] 03/16/2022, 12:15 PM

## 2022-03-16 NOTE — Progress Notes (Signed)
Addendum to PREP class: Salt and sugar demos done

## 2022-03-19 DIAGNOSIS — I4891 Unspecified atrial fibrillation: Secondary | ICD-10-CM | POA: Diagnosis not present

## 2022-03-19 DIAGNOSIS — N2581 Secondary hyperparathyroidism of renal origin: Secondary | ICD-10-CM | POA: Diagnosis not present

## 2022-03-19 DIAGNOSIS — N189 Chronic kidney disease, unspecified: Secondary | ICD-10-CM | POA: Diagnosis not present

## 2022-03-19 DIAGNOSIS — I509 Heart failure, unspecified: Secondary | ICD-10-CM | POA: Diagnosis not present

## 2022-03-19 DIAGNOSIS — I120 Hypertensive chronic kidney disease with stage 5 chronic kidney disease or end stage renal disease: Secondary | ICD-10-CM | POA: Diagnosis not present

## 2022-03-19 DIAGNOSIS — M109 Gout, unspecified: Secondary | ICD-10-CM | POA: Diagnosis not present

## 2022-03-19 DIAGNOSIS — N200 Calculus of kidney: Secondary | ICD-10-CM | POA: Diagnosis not present

## 2022-03-19 DIAGNOSIS — D631 Anemia in chronic kidney disease: Secondary | ICD-10-CM | POA: Diagnosis not present

## 2022-03-19 DIAGNOSIS — N185 Chronic kidney disease, stage 5: Secondary | ICD-10-CM | POA: Diagnosis not present

## 2022-03-19 DIAGNOSIS — E1122 Type 2 diabetes mellitus with diabetic chronic kidney disease: Secondary | ICD-10-CM | POA: Diagnosis not present

## 2022-03-25 NOTE — Progress Notes (Signed)
[facility] PREP Weekly Session  Patient Details  Name: [name] MRN: [id number] Date of Birth: [birth year] Age: 76 y.o. PCP: [name], MD  Vitals:   03/23/22 1030  Weight: 194 lb (88 kg)     [facility] Weekly seesion - 03/25/22 1000       [facility] "PREP" Location   [facility] "PREP" Location [facility]      Weekly Session   Topic Discussed --   portions   Minutes exercised this week 270 minutes    Classes attended to date [name] 03/25/2022, 10:02 AM

## 2022-03-27 DIAGNOSIS — N5201 Erectile dysfunction due to arterial insufficiency: Secondary | ICD-10-CM | POA: Diagnosis not present

## 2022-03-27 DIAGNOSIS — E291 Testicular hypofunction: Secondary | ICD-10-CM | POA: Diagnosis not present

## 2022-03-31 NOTE — Progress Notes (Signed)
[facility] PREP Weekly Session  Patient Details  Name: [name] MRN: [id number] Date of Birth: 1946/07/21 Age: 76 y.o. PCP: [name], MD  There were no vitals filed for this visit.   [facility] Weekly seesion - 03/31/22 1600       [facility] "PREP" Location   [facility] "PREP" Location [facility]      Weekly Session   Topic Discussed Finding support    Minutes exercised this week 540 minutes    Classes attended to date 17             Pam Tally Joe 03/31/2022, 4:20 PM

## 2022-04-06 DIAGNOSIS — E291 Testicular hypofunction: Secondary | ICD-10-CM | POA: Diagnosis not present

## 2022-04-06 DIAGNOSIS — E1122 Type 2 diabetes mellitus with diabetic chronic kidney disease: Secondary | ICD-10-CM | POA: Diagnosis not present

## 2022-04-07 ENCOUNTER — Other Ambulatory Visit (HOSPITAL_COMMUNITY): Payer: Self-pay | Admitting: *Deleted

## 2022-04-07 DIAGNOSIS — I480 Paroxysmal atrial fibrillation: Secondary | ICD-10-CM

## 2022-04-08 ENCOUNTER — Encounter (HOSPITAL_COMMUNITY): Payer: Self-pay | Admitting: Physician Assistant

## 2022-04-08 ENCOUNTER — Ambulatory Visit (HOSPITAL_BASED_OUTPATIENT_CLINIC_OR_DEPARTMENT_OTHER)
Admission: RE | Admit: 2022-04-08 | Discharge: 2022-04-08 | Disposition: A | Discharge status: Home / Self Care | Payer: Medicare Other | Source: Ambulatory Visit | Attending: Physician Assistant | Admitting: Physician Assistant

## 2022-04-08 VITALS — BP 124/72 | HR 69 | Ht 69.0 in | Wt 197.2 lb

## 2022-04-08 DIAGNOSIS — Z823 Family history of stroke: Secondary | ICD-10-CM | POA: Diagnosis not present

## 2022-04-08 DIAGNOSIS — N189 Chronic kidney disease, unspecified: Secondary | ICD-10-CM | POA: Insufficient documentation

## 2022-04-08 DIAGNOSIS — E669 Obesity, unspecified: Secondary | ICD-10-CM | POA: Insufficient documentation

## 2022-04-08 DIAGNOSIS — M109 Gout, unspecified: Secondary | ICD-10-CM | POA: Diagnosis present

## 2022-04-08 DIAGNOSIS — I4811 Longstanding persistent atrial fibrillation: Secondary | ICD-10-CM | POA: Diagnosis not present

## 2022-04-08 DIAGNOSIS — E1122 Type 2 diabetes mellitus with diabetic chronic kidney disease: Secondary | ICD-10-CM | POA: Diagnosis not present

## 2022-04-08 DIAGNOSIS — I700 Atherosclerosis of aorta: Secondary | ICD-10-CM | POA: Diagnosis not present

## 2022-04-08 DIAGNOSIS — I471 Supraventricular tachycardia: Secondary | ICD-10-CM | POA: Insufficient documentation

## 2022-04-08 DIAGNOSIS — E782 Mixed hyperlipidemia: Secondary | ICD-10-CM | POA: Diagnosis not present

## 2022-04-08 DIAGNOSIS — I252 Old myocardial infarction: Secondary | ICD-10-CM | POA: Diagnosis not present

## 2022-04-08 DIAGNOSIS — I130 Hypertensive heart and chronic kidney disease with heart failure and stage 1 through stage 4 chronic kidney disease, or unspecified chronic kidney disease: Secondary | ICD-10-CM | POA: Diagnosis not present

## 2022-04-08 DIAGNOSIS — R109 Unspecified abdominal pain: Secondary | ICD-10-CM | POA: Diagnosis not present

## 2022-04-08 DIAGNOSIS — Z7901 Long term (current) use of anticoagulants: Secondary | ICD-10-CM | POA: Insufficient documentation

## 2022-04-08 DIAGNOSIS — R1031 Right lower quadrant pain: Secondary | ICD-10-CM | POA: Diagnosis not present

## 2022-04-08 DIAGNOSIS — I129 Hypertensive chronic kidney disease with stage 1 through stage 4 chronic kidney disease, or unspecified chronic kidney disease: Secondary | ICD-10-CM | POA: Diagnosis not present

## 2022-04-08 DIAGNOSIS — I482 Chronic atrial fibrillation, unspecified: Secondary | ICD-10-CM | POA: Diagnosis not present

## 2022-04-08 DIAGNOSIS — Z96651 Presence of right artificial knee joint: Secondary | ICD-10-CM | POA: Diagnosis present

## 2022-04-08 DIAGNOSIS — I110 Hypertensive heart disease with heart failure: Secondary | ICD-10-CM | POA: Diagnosis not present

## 2022-04-08 DIAGNOSIS — I1310 Hypertensive heart and chronic kidney disease without heart failure, with stage 1 through stage 4 chronic kidney disease, or unspecified chronic kidney disease: Secondary | ICD-10-CM | POA: Insufficient documentation

## 2022-04-08 DIAGNOSIS — D6869 Other thrombophilia: Secondary | ICD-10-CM | POA: Insufficient documentation

## 2022-04-08 DIAGNOSIS — I509 Heart failure, unspecified: Secondary | ICD-10-CM | POA: Diagnosis not present

## 2022-04-08 DIAGNOSIS — N184 Chronic kidney disease, stage 4 (severe): Secondary | ICD-10-CM | POA: Diagnosis not present

## 2022-04-08 DIAGNOSIS — Z833 Family history of diabetes mellitus: Secondary | ICD-10-CM | POA: Diagnosis not present

## 2022-04-08 DIAGNOSIS — Z818 Family history of other mental and behavioral disorders: Secondary | ICD-10-CM | POA: Diagnosis not present

## 2022-04-08 DIAGNOSIS — Z9884 Bariatric surgery status: Secondary | ICD-10-CM | POA: Insufficient documentation

## 2022-04-08 DIAGNOSIS — K8020 Calculus of gallbladder without cholecystitis without obstruction: Secondary | ICD-10-CM | POA: Diagnosis present

## 2022-04-08 DIAGNOSIS — I4892 Unspecified atrial flutter: Secondary | ICD-10-CM | POA: Insufficient documentation

## 2022-04-08 DIAGNOSIS — Z9989 Dependence on other enabling machines and devices: Secondary | ICD-10-CM | POA: Diagnosis not present

## 2022-04-08 DIAGNOSIS — N132 Hydronephrosis with renal and ureteral calculous obstruction: Secondary | ICD-10-CM | POA: Diagnosis not present

## 2022-04-08 DIAGNOSIS — I2510 Atherosclerotic heart disease of native coronary artery without angina pectoris: Secondary | ICD-10-CM | POA: Insufficient documentation

## 2022-04-08 DIAGNOSIS — N179 Acute kidney failure, unspecified: Secondary | ICD-10-CM | POA: Diagnosis not present

## 2022-04-08 DIAGNOSIS — Z820 Family history of epilepsy and other diseases of the nervous system: Secondary | ICD-10-CM | POA: Diagnosis not present

## 2022-04-08 DIAGNOSIS — I5032 Chronic diastolic (congestive) heart failure: Secondary | ICD-10-CM | POA: Insufficient documentation

## 2022-04-08 DIAGNOSIS — N201 Calculus of ureter: Secondary | ICD-10-CM | POA: Diagnosis not present

## 2022-04-08 DIAGNOSIS — Z952 Presence of prosthetic heart valve: Secondary | ICD-10-CM | POA: Diagnosis not present

## 2022-04-08 DIAGNOSIS — I4819 Other persistent atrial fibrillation: Secondary | ICD-10-CM | POA: Insufficient documentation

## 2022-04-08 DIAGNOSIS — Z8249 Family history of ischemic heart disease and other diseases of the circulatory system: Secondary | ICD-10-CM | POA: Diagnosis not present

## 2022-04-08 DIAGNOSIS — G4733 Obstructive sleep apnea (adult) (pediatric): Secondary | ICD-10-CM | POA: Insufficient documentation

## 2022-04-08 DIAGNOSIS — Z951 Presence of aortocoronary bypass graft: Secondary | ICD-10-CM | POA: Insufficient documentation

## 2022-04-08 DIAGNOSIS — I4891 Unspecified atrial fibrillation: Secondary | ICD-10-CM | POA: Diagnosis not present

## 2022-04-08 LAB — CBC
HCT: 37.7 % — ABNORMAL LOW (ref 39.0–52.0)
Hemoglobin: 12.9 g/dL — ABNORMAL LOW (ref 13.0–17.0)
MCH: 32.5 pg (ref 26.0–34.0)
MCHC: 34.2 g/dL (ref 30.0–36.0)
MCV: 95 fL (ref 80.0–100.0)
Platelets: 148 10*3/uL — ABNORMAL LOW (ref 150–400)
RBC: 3.97 MIL/uL — ABNORMAL LOW (ref 4.22–5.81)
RDW: 12.4 % (ref 11.5–15.5)
WBC: 6.4 10*3/uL (ref 4.0–10.5)
nRBC: 0 % (ref 0.0–0.2)

## 2022-04-08 LAB — COMPREHENSIVE METABOLIC PANEL
ALT: 13 U/L (ref 0–44)
AST: 16 U/L (ref 15–41)
Albumin: 4 g/dL (ref 3.5–5.0)
Alkaline Phosphatase: 109 U/L (ref 38–126)
Anion gap: 9 (ref 5–15)
BUN: 47 mg/dL — ABNORMAL HIGH (ref 8–23)
CO2: 26 mmol/L (ref 22–32)
Calcium: 10.3 mg/dL (ref 8.9–10.3)
Chloride: 103 mmol/L (ref 98–111)
Creatinine, Ser: 3.9 mg/dL — ABNORMAL HIGH (ref 0.61–1.24)
GFR, Estimated: 15 mL/min — ABNORMAL LOW (ref >60)
Glucose, Bld: 213 mg/dL — ABNORMAL HIGH (ref 70–99)
Potassium: 4 mmol/L (ref 3.5–5.1)
Sodium: 138 mmol/L (ref 135–145)
Total Bilirubin: 0.6 mg/dL (ref 0.3–1.2)
Total Protein: 6.4 g/dL — ABNORMAL LOW (ref 6.5–8.1)

## 2022-04-08 LAB — TSH: TSH: 1.756 u[IU]/mL (ref 0.350–4.500)

## 2022-04-08 NOTE — Progress Notes (Signed)
Primary Care Physician: [name], MD Referring Physician: Thayer [place] Cardiologist: Dr. Aundra Dubin  EP: Dr Ricki Miller is a 76 y.o. male with a h/o CKD, CAD s/p CABG and obesity s/p lap banding. In 2017, he developed increased exertional dyspnea.  Eventually had a TEE showing severe MR with prolapse of a portion of the anterior mitral valve leaflet.  Coronary angiography in 6/17 showed patent grafts and 90% stenosis of distal diagonal not amenable to intervention.  In 8/17, he had mitral valve repair.  Post-op diastolic CHF, Lasix started and increased.   He developed atrial arrhythmias in 12/18.  Both a. fibrillation and flutter were seen. Saw Dr. Rayann Heman, recommended DCCV,   amiodarone or sotalol if fibrillation recurred shortly after cardioversion.  He had DCCV in 1/19 and  has been in [place] for the last 2 years.Marland Kitchen   He is now in afib clinic as he was seen in the [place] yesterday and was noted to be in rate controlled afib. Was referred here. He continues in rate controlled afib today. He was unaware that he was out of rhythm, but now looking back, he feels that he has had fatigue for around 2 weeks.He does not know of an trigger other than  has had some issues with gout. No  missed anticoagulation for at least 3 weeks.  F/u in afib clinic, 08/2819. He had a successful cardioversion and felt well for the day, then started feeling worse the next day. He is back in rate controlled afib.  F/u in afib clinic, 10/12/19. He has been loading on amiodarone 200 mg bid. He remians in afib with CVR so  will plan for cardioversion. He states no missed doses of eliquis  5 mg bid for the last 3 weeks. He feels very fatigued in afib.   Follow up in the AF clinic 04/08/22. Patient reports that 04/03/22 he noted increased fatigue with exertion. He and his wife also noticed an irregular heart beat. ECG today shows rate controlled afib. There were no specific triggers that he could identify.   Today,  he denies symptoms of chest pain, shortness of breath, orthopnea, PND, lower extremity edema, dizziness, presyncope, syncope, or neurologic sequela.+ fatigue. The patient is tolerating medications without difficulties and is otherwise without complaint today.   Past Medical History:  Diagnosis Date   Allergic rhinitis    Anxiety    Atrial fibrillation (HCC)    Cancer (HCC)    hx of skin cancers    Chronic kidney disease    Coronary artery disease    a. s/p MI & CABG; b. s/p PCI Diag;  c. Cath 12/2011: LAD diff dzs/small, Diag 75% isr, 90 dist to stent (small), LCX & RCA occluded, VG->OM 40, VG->PDA patent - med rx.   COVID-19    x 3   Depression    PTSD   Diabetes mellitus    not on medications   Diastolic CHF, chronic ([place])    a. EF 55-60% by echo 2007   GERD (gastroesophageal reflux disease)    "not anymore" " I had a lap band"   History of diverticulitis of colon    5 YRS AGO   History of pneumonia    2017   History of transient ischemic attack (TIA)    CAROTID DOPPLERS NOV 2011  0-39& BIL. STENOSIS   Hyperlipidemia    Hypertension    Kidney failure    Mitral regurgitation    Myocardial infarction ([place])  Neuromuscular disorder ([place])    left arm numbness    OA (osteoarthritis)    RIGHT KNEE ARTHOFIBROSIS W/ PAIN  (S/P  REPLACEMENT 2004)   PTSD (post-traumatic stress disorder)    from [place] since 1973   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/25/2016   Complex valvuloplasty including artificial Gore-tex neochord placement x4, plication of anterior commissure, and 26 mm Sorin Memo 3D ring annuloplasty via right mini thoracotomy approach   Shortness of breath dyspnea    with exertion   Skin cancer    Sleep apnea    cpap- see ov note in EPIC 05/12/11 for settings    Stroke [facility])    hx of   Stroke [facility])    Past Surgical History:  Procedure Laterality Date   AV FISTULA PLACEMENT Left 08/02/2018   Procedure: ARTERIOVENOUS (AV) FISTULA CREATION RADIOCEPHALIC;   Surgeon: Angelia Mould, MD;  Location: [place];  Service: Vascular;  Laterality: Left;   [place]  2001, 2004, 2009, 2011   CARDIAC CATHETERIZATION N/A 01/23/2016   Procedure: Right/Left Heart Cath and Coronary Angiography;  Surgeon: [name], MD;  Location: [place] CV LAB;  Service: Cardiovascular;  Laterality: N/A;   CARDIOVERSION N/A 09/07/2017   Procedure: CARDIOVERSION;  Surgeon: [name], MD;  Location: [facility] ENDOSCOPY;  Service: Cardiovascular;  Laterality: N/A;   CARDIOVERSION N/A 09/13/2019   Procedure: CARDIOVERSION;  Surgeon: [name], MD;  Location: [facility] ENDOSCOPY;  Service: Cardiovascular;  Laterality: N/A;   CARDIOVERSION N/A 10/19/2019   Procedure: CARDIOVERSION;  Surgeon: [name], MD;  Location: [facility] ENDOSCOPY;  Service: Cardiovascular;  Laterality: N/A;   CATARACT EXTRACTION W/ INTRAOCULAR LENS IMPLANT Bilateral    CHONDROPLASTY  07/22/2011   Procedure: CHONDROPLASTY;  Surgeon: Dione Plover Aluisio;  Location: [place];  Service: Orthopedics;;   CIRCUMCISION  [place]-- POST CABG   W/ STENT, last cath 01/07/2012    CORONARY ARTERY BYPASS GRAFT  1995   X3 VESSEL   CORONARY ARTERY BYPASS GRAFT  1995   CORONARY STENT PLACEMENT     CYSTOSCOPY/URETEROSCOPY/HOLMIUM LASER/STENT PLACEMENT Right 01/08/2021   Procedure: CYSTOSCOPY/RETROGRADE/URETEROSCOPY/HOLMIUM LASER/STENT PLACEMENT;  Surgeon: Franchot Gallo, MD;  Location: [facility];  Service: Urology;  Laterality: Right;   INCISION AND DRAINAGE PERIRECTAL ABSCESS N/A 09/01/2021   Procedure: IRRIGATION AND DEBRIDEMENT PERIRECTAL ABSCESS;  Surgeon: Kieth Brightly Arta Bruce, MD;  Location: [facility];  Service: General;  Laterality: N/A;   IR PERC TUN PERIT CATH WO PORT S&I [place]  03/06/2021   IR REMOVAL TUN CV CATH W/O FL  04/14/2021   KNEE ARTHROSCOPY  07/22/2011   Procedure: ARTHROSCOPY KNEE;   Surgeon: [name];  Location: [place];  Service: Orthopedics;  Laterality: Right;  WITH DEBRIDEMENT    KNEE ARTHROSCOPY W/ MENISCECTOMY  X2 IN 2002-- RIGHT KNEE   LAPAROSCOPIC GASTRIC BANDING  01-15-09   LEFT HEART CATH AND CORONARY ANGIOGRAPHY N/A 04/29/2017   Procedure: LEFT HEART CATH AND CORONARY ANGIOGRAPHY;  Surgeon: [name], MD;  Location: [place] CV LAB;  Service: Cardiovascular;  Laterality: N/A;   LEFT HEART CATHETERIZATION WITH CORONARY ANGIOGRAM N/A 09/11/2013   Procedure: LEFT HEART CATHETERIZATION WITH CORONARY ANGIOGRAM;  Surgeon: [name], MD;  Location: [facility] CATH LAB;  Service: Cardiovascular;  Laterality: N/A;   MITRAL VALVE REPAIR Right 03/25/2016   Procedure: MINIMALLY INVASIVE  REOPERATION FOR MITRAL VALVE REPAIR  (MVR) with size 26 Sorin Memo 3D;  Surgeon: [name], MD;  Location: [place];  Service: Open Heart Surgery;  Laterality: Right;   MITRAL VALVE REPAIR  03/2016   RIGHT KNEE ED COMPARTMENT REPLACEMENT  2004   SYNOVECTOMY  07/22/2011   Procedure: SYNOVECTOMY;  Surgeon: Dione Plover Aluisio;  Location: [place];  Service: Orthopedics;;   TEE WITHOUT CARDIOVERSION N/A 01/23/2016   Procedure: TRANSESOPHAGEAL ECHOCARDIOGRAM (TEE);  Surgeon: [name], MD;  Location: [place];  Service: Cardiovascular;  Laterality: N/A;   TEE WITHOUT CARDIOVERSION N/A 03/25/2016   Procedure: TRANSESOPHAGEAL ECHOCARDIOGRAM (TEE);  Surgeon: [name], MD;  Location: [place];  Service: Open Heart Surgery;  Laterality: N/A;   TEE WITHOUT CARDIOVERSION N/A 03/04/2021   Procedure: TRANSESOPHAGEAL ECHOCARDIOGRAM (TEE);  Surgeon: Lelon Perla, MD;  Location: [facility] ENDOSCOPY;  Service: Cardiovascular;  Laterality: N/A;   UMBILICAL HERNIA REPAIR  01-15-09   W/ GASTRIC BANDING PROCEDURE    Current Outpatient Medications  Medication Sig Dispense Refill   acetaminophen (TYLENOL) 325 MG tablet Take 2 tablets (650 mg total) by mouth  every 6 (six) hours as needed for mild pain (or Fever >/= 101).     Alirocumab (PRALUENT) 75 MG/ML SOAJ Inject 75 mg into the skin every 14 (fourteen) days.     allopurinol (ZYLOPRIM) 100 MG tablet Take 0.5 tablets (50 mg total) by mouth 2 (two) times a week. Please discuss dosing with your PCP based on your renal function. 4 tablet 0   amiodarone (PACERONE) 200 MG tablet Take 0.5 tablets (100 mg total) by mouth daily. 60 tablet 0   apixaban (ELIQUIS) 5 MG TABS tablet Take 1 tablet (5 mg total) by mouth 2 (two) times daily. 180 tablet 3   Ascorbic Acid (VITAMIN C WITH ROSE HIPS) 1000 MG tablet Take 1,000 mg by mouth daily.     cetirizine (ZYRTEC) 10 MG tablet Take 10 mg by mouth daily.     colchicine 0.6 MG tablet Take 0.6-1.2 mg by mouth 2 (two) times daily as needed (gout).      Continuous Blood Gluc Sensor (DEXCOM G7 SENSOR) MISC 3 each by Does not apply route every 30 (thirty) days. Apply 1 sensor every 10 days 9 each 4   cycloSPORINE (RESTASIS) 0.05 % ophthalmic emulsion Place 1 drop into both eyes 2 (two) times daily. As needed     diclofenac Sodium (VOLTAREN) 1 % GEL Apply 2 g topically 3 (three) times daily as needed for pain.     EFUDEX 5 % cream Apply 1 application topically daily.     ezetimibe (ZETIA) 10 MG tablet Take 1 tablet (10 mg total) by mouth daily. 90 tablet 3   fluticasone (FLONASE) 50 MCG/ACT nasal spray Place 1 spray into both nostrils daily as needed for allergies or rhinitis.     furosemide (LASIX) 40 MG tablet Take 40 mg by mouth daily.     furosemide (LASIX) 80 MG tablet Take 80 mg by mouth every other day.     HYDROcodone-acetaminophen (NORCO/VICODIN) 5-325 MG tablet Take 1 tablet by mouth as needed.     methocarbamol (ROBAXIN) 500 MG tablet Take 500 mg by mouth 3 (three) times daily as needed for muscle spasms.     Multiple Vitamin (MULTIVITAMIN) capsule Take 1 capsule by mouth daily.     nitroGLYCERIN (NITROSTAT) 0.4 MG SL tablet 1 TAB UNDER THE TONGUE EVERY 5  MINUTES AS NEEDED FOR CHEST  PAIN UP TO 3 DOSES, IF PERSIST CALL 911 25 tablet 3   ONETOUCH VERIO test strip 1 each daily.     pantoprazole (PROTONIX) 40 MG tablet Take 40 mg by mouth daily as needed (heartburn).     Semaglutide,0.25 or 0.'5MG'$ /DOS, (OZEMPIC, 0.25 OR 0.5 MG/DOSE,) 2 MG/1.5ML SOPN Inject 0.5 mg into the skin once a week. 4.5 mL 3   sildenafil (VIAGRA) 100 MG tablet Take 50 mg by mouth daily as needed for erectile dysfunction.     traZODone (DESYREL) 100 MG tablet Take 100 mg by mouth at bedtime as needed for sleep.     No current facility-administered medications for this encounter.    Allergies  Allergen Reactions   Crestor [Rosuvastatin] Other (See Comments)    Muscle aches    Lipitor [Atorvastatin] Other (See Comments)    Muscle aches   Pravastatin Other (See Comments)    Muscle aches   Carvedilol     Other reaction(s): loss of appetite   Metformin Diarrhea   Serotonin     Other reaction(s): Other (See Comments) Other reaction(s): Muscle aches   Serotonin Reuptake Inhibitors (Ssris)     Other reaction(s): Muscle aches   Zocor [Simvastatin]     Other reaction(s): Muscle pain   Codeine Itching and Other (See Comments)    Extremity tingling-- can take synthetic    Social History   Socioeconomic History   Marital status: Married    Spouse name: [name]   Number of children: 1   Years of education: Not on file   Highest education level: Master's degree (e.g., MA, MS, MEng, MEd, MSW, MBA)  Occupational History    Comment: Chief Financial Officer retired  Tobacco Use   Smoking status: Never   Smokeless tobacco: Never   Tobacco comments:    Never smoke 04/08/22  Vaping Use   Vaping Use: Never used  Substance and Sexual Activity   Alcohol use: Not Currently    Alcohol/week: 2.0 standard drinks of alcohol    Types: 1 Glasses of wine, 1 Shots of liquor per week    Comment: OCCASIONAL   Drug use: Never   Sexual activity: Not on file  Other Topics Concern    Not on file  Social History Narrative   Lives with wife   occas soda   Social Determinants of Health   Financial Resource Strain: Not on file  Food Insecurity: No Food Insecurity (03/27/2021)   Hunger Vital Sign    Worried About Running Out of Food in the Last Year: Never true    [place] in the Last Year: Never true  Transportation Needs: No Transportation Needs (03/27/2021)   PRAPARE - Hydrologist (Medical): No    Lack of Transportation (Non-Medical): No  Physical Activity: Not on file  Stress: Not on file  Social Connections: Not on file  Intimate Partner Violence: Not on file    Family History  Problem Relation Age of Onset   Other Mother        joint problems   Alzheimer's disease Mother    Hypertension Father    Heart disease Father    CVA Father        age 20   Depression Father    Heart disease Sister        CABG   Stroke Sister    Heart failure Sister        congestive   Diabetes Sister    Heart  disease Brother    Hypertension Brother    Congestive Heart Failure Brother     ROS- All systems are reviewed and negative except as per the HPI above  Physical Exam: Vitals:   04/08/22 1440  BP: 124/72  Pulse: 69  Weight: 89.4 kg  Height: '5\' 9"'$  (1.753 m)   Wt Readings from Last 3 Encounters:  04/08/22 89.4 kg  03/23/22 88 kg  03/16/22 88.5 kg    Labs: Lab Results  Component Value Date   NA 140 12/18/2021   K 4.5 12/18/2021   CL 105 12/18/2021   CO2 26 12/18/2021   GLUCOSE 124 (H) 12/18/2021   BUN 40 (H) 12/18/2021   CREATININE 4.04 (H) 12/18/2021   CALCIUM 10.8 (H) 12/18/2021   PHOS 3.7 01/10/2021   MG 2.0 01/10/2021   Lab Results  Component Value Date   INR 1.6 (H) 02/01/2021   Lab Results  Component Value Date   CHOL 171 12/18/2021   HDL 50 12/18/2021   LDLCALC 100 (H) 12/18/2021   TRIG 106 12/18/2021     GEN- The patient is a well appearing elderly male, alert and oriented x 3 today.    HEENT-head normocephalic, atraumatic, sclera clear, conjunctiva pink, hearing intact, trachea midline. Lungs- Clear to ausculation bilaterally, normal work of breathing Heart- irregular rate and rhythm, no murmurs, rubs or gallops  GI- soft, NT, ND, + BS Extremities- no clubbing, cyanosis, or edema MS- no significant deformity or atrophy Skin- no rash or lesion Psych- euthymic mood, full affect Neuro- strength and sensation are intact   EKG- Afib, RBBB, LAFB Vent. rate 69 BPM PR interval * ms QRS duration 160 ms QT/QTcB 470/503 ms   CHA2DS2-VASc Score = 8  The patient's score is based upon: CHF History: 1 HTN History: 1 Diabetes History: 1 Stroke History: 2 Vascular Disease History: 1 Age Score: 2 Gender Score: 0       ASSESSMENT AND PLAN: 1. Persistent Atrial Fibrillation/atrial flutter/atrial tachycardia The patient's CHA2DS2-VASc score is 8, indicating a 10.8% annual risk of stroke.   Patient in symptomatic, rate controlled afib.  We discussed rhythm control options today.  Will increase amiodarone to 200 mg daily and plan for DCCV.  Check cmet/TSH/cbc today.  Continue Eliquis 5 mg BID  2. Secondary Hypercoagulable State (ICD10:  D68.69) The patient is at significant risk for stroke/thromboembolism based upon his CHA2DS2-VASc Score of 8.  Continue Apixaban (Eliquis).   3. Chronic HFpEF Followed in [facility] Fluid status stable today.   4. CAD  S/p CABG On Praluent  No anginal symptoms.  5. MR S/p mitral valve repair 2017  6. OSA The importance of adequate treatment of sleep apnea was discussed today in order to improve our ability to maintain sinus rhythm long term. Encouraged compliance with CPAP therapy.  7. HTN Stable, no changes today.   Follow up with Dr Aundra Dubin as scheduled post DCCV.   [facility] [address] [place], [place] [address] [phone]

## 2022-04-08 NOTE — Patient Instructions (Signed)
Increase Amiodarone '200mg'$  daily  Day of Cardioverison decrease Amiodarone '100mg'$   Cardioversion scheduled for Monday August 21  - Arrive at the [facility] and go to admitting at 11:30am  - Do not eat or drink anything after midnight the night prior to your procedure.  - Take all your morning medication (except diabetic medications) with a sip of water prior to arrival.  - You will not be able to drive home after your procedure.  - Do NOT miss any doses of your blood thinner - if you should miss a dose please notify our office immediately.  - If you feel as if you go back into normal rhythm prior to scheduled cardioversion, please notify our office immediately. If your procedure is canceled in the cardioversion suite you will be charged a cancellation fee.

## 2022-04-08 NOTE — H&P (View-Only) (Signed)
Primary Care Physician: [name], MD Referring Physician: Thayer [place] Cardiologist: Dr. Aundra Dubin  EP: Dr Ricki Miller is a 76 y.o. male with a h/o CKD, CAD s/p CABG and obesity s/p lap banding. In 2017, he developed increased exertional dyspnea.  Eventually had a TEE showing severe MR with prolapse of a portion of the anterior mitral valve leaflet.  Coronary angiography in 6/17 showed patent grafts and 90% stenosis of distal diagonal not amenable to intervention.  In 8/17, he had mitral valve repair.  Post-op diastolic CHF, Lasix started and increased.   He developed atrial arrhythmias in 12/18.  Both a. fibrillation and flutter were seen. Saw Dr. Rayann Heman, recommended DCCV,   amiodarone or sotalol if fibrillation recurred shortly after cardioversion.  He had DCCV in 1/19 and  has been in [place] for the last 2 years.Marland Kitchen   He is now in afib clinic as he was seen in the [place] yesterday and was noted to be in rate controlled afib. Was referred here. He continues in rate controlled afib today. He was unaware that he was out of rhythm, but now looking back, he feels that he has had fatigue for around 2 weeks.He does not know of an trigger other than  has had some issues with gout. No  missed anticoagulation for at least 3 weeks.  F/u in afib clinic, 08/2819. He had a successful cardioversion and felt well for the day, then started feeling worse the next day. He is back in rate controlled afib.  F/u in afib clinic, 10/12/19. He has been loading on amiodarone 200 mg bid. He remians in afib with CVR so  will plan for cardioversion. He states no missed doses of eliquis  5 mg bid for the last 3 weeks. He feels very fatigued in afib.   Follow up in the AF clinic 04/08/22. Patient reports that 04/03/22 he noted increased fatigue with exertion. He and his wife also noticed an irregular heart beat. ECG today shows rate controlled afib. There were no specific triggers that he could identify.   Today,  he denies symptoms of chest pain, shortness of breath, orthopnea, PND, lower extremity edema, dizziness, presyncope, syncope, or neurologic sequela.+ fatigue. The patient is tolerating medications without difficulties and is otherwise without complaint today.   Past Medical History:  Diagnosis Date   Allergic rhinitis    Anxiety    Atrial fibrillation (HCC)    Cancer (HCC)    hx of skin cancers    Chronic kidney disease    Coronary artery disease    a. s/p MI & CABG; b. s/p PCI Diag;  c. Cath 12/2011: LAD diff dzs/small, Diag 75% isr, 90 dist to stent (small), LCX & RCA occluded, VG->OM 40, VG->PDA patent - med rx.   COVID-19    x 3   Depression    PTSD   Diabetes mellitus    not on medications   Diastolic CHF, chronic ([place])    a. EF 55-60% by echo 2007   GERD (gastroesophageal reflux disease)    "not anymore" " I had a lap band"   History of diverticulitis of colon    5 YRS AGO   History of pneumonia    2017   History of transient ischemic attack (TIA)    CAROTID DOPPLERS NOV 2011  0-39& BIL. 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   Complex valvuloplasty including artificial Gore-tex neochord placement x4, plication of anterior commissure, and 26 mm Sorin Memo 3D ring annuloplasty via right mini thoracotomy approach   Shortness of breath dyspnea    with exertion   Skin cancer    Sleep apnea    cpap- see ov note in EPIC 05/12/11 for settings    Stroke [facility])    hx of   Stroke [facility])    Past Surgical History:  Procedure Laterality Date   AV FISTULA PLACEMENT Left 08/02/2018   Procedure: ARTERIOVENOUS (AV) FISTULA CREATION RADIOCEPHALIC;   Surgeon: Angelia Mould, MD;  Location: [place];  Service: Vascular;  Laterality: Left;   [place]  2001, 2004, 2009, 2011   CARDIAC CATHETERIZATION N/A 01/23/2016   Procedure: Right/Left Heart Cath and Coronary Angiography;  Surgeon: [name], MD;  Location: [place] CV LAB;  Service: Cardiovascular;  Laterality: N/A;   CARDIOVERSION N/A 09/07/2017   Procedure: CARDIOVERSION;  Surgeon: [name], MD;  Location: [facility] ENDOSCOPY;  Service: Cardiovascular;  Laterality: N/A;   CARDIOVERSION N/A 09/13/2019   Procedure: CARDIOVERSION;  Surgeon: [name], MD;  Location: [facility] ENDOSCOPY;  Service: Cardiovascular;  Laterality: N/A;   CARDIOVERSION N/A 10/19/2019   Procedure: CARDIOVERSION;  Surgeon: [name], MD;  Location: [facility] ENDOSCOPY;  Service: Cardiovascular;  Laterality: N/A;   CATARACT EXTRACTION W/ INTRAOCULAR LENS IMPLANT Bilateral    CHONDROPLASTY  07/22/2011   Procedure: CHONDROPLASTY;  Surgeon: Dione Plover Aluisio;  Location: [place];  Service: Orthopedics;;   CIRCUMCISION  [place]-- POST CABG   W/ STENT, last cath 01/07/2012    CORONARY ARTERY BYPASS GRAFT  1995   X3 VESSEL   CORONARY ARTERY BYPASS GRAFT  1995   CORONARY STENT PLACEMENT     CYSTOSCOPY/URETEROSCOPY/HOLMIUM LASER/STENT PLACEMENT Right 01/08/2021   Procedure: CYSTOSCOPY/RETROGRADE/URETEROSCOPY/HOLMIUM LASER/STENT PLACEMENT;  Surgeon: Franchot Gallo, MD;  Location: [facility];  Service: Urology;  Laterality: Right;   INCISION AND DRAINAGE PERIRECTAL ABSCESS N/A 09/01/2021   Procedure: IRRIGATION AND DEBRIDEMENT PERIRECTAL ABSCESS;  Surgeon: Kieth Brightly Arta Bruce, MD;  Location: [facility];  Service: General;  Laterality: N/A;   IR PERC TUN PERIT CATH WO PORT S&I Dartha Lodge  03/06/2021   IR REMOVAL TUN CV CATH W/O FL  04/14/2021   KNEE ARTHROSCOPY  07/22/2011   Procedure: ARTHROSCOPY KNEE;   Surgeon: [name];  Location: [place];  Service: Orthopedics;  Laterality: Right;  WITH DEBRIDEMENT    KNEE ARTHROSCOPY W/ MENISCECTOMY  X2 IN 2002-- RIGHT KNEE   LAPAROSCOPIC GASTRIC BANDING  01-15-09   LEFT HEART CATH AND CORONARY ANGIOGRAPHY N/A 04/29/2017   Procedure: LEFT HEART CATH AND CORONARY ANGIOGRAPHY;  Surgeon: [name], MD;  Location: [place] CV LAB;  Service: Cardiovascular;  Laterality: N/A;   LEFT HEART CATHETERIZATION WITH CORONARY ANGIOGRAM N/A 09/11/2013   Procedure: LEFT HEART CATHETERIZATION WITH CORONARY ANGIOGRAM;  Surgeon: [name], MD;  Location: [facility] CATH LAB;  Service: Cardiovascular;  Laterality: N/A;   MITRAL VALVE REPAIR Right 03/25/2016   Procedure: MINIMALLY INVASIVE  REOPERATION FOR MITRAL VALVE REPAIR  (MVR) with size 26 Sorin Memo 3D;  Surgeon: [name], MD;  Location: [place];  Service: Open Heart Surgery;  Laterality: Right;   MITRAL VALVE REPAIR  03/2016   RIGHT KNEE ED COMPARTMENT REPLACEMENT  2004   SYNOVECTOMY  07/22/2011   Procedure: SYNOVECTOMY;  Surgeon: Dione Plover Aluisio;  Location: [place];  Service: Orthopedics;;   TEE WITHOUT CARDIOVERSION N/A 01/23/2016   Procedure: TRANSESOPHAGEAL ECHOCARDIOGRAM (TEE);  Surgeon: [name], MD;  Location: [place];  Service: Cardiovascular;  Laterality: N/A;   TEE WITHOUT CARDIOVERSION N/A 03/25/2016   Procedure: TRANSESOPHAGEAL ECHOCARDIOGRAM (TEE);  Surgeon: [name], MD;  Location: [place];  Service: Open Heart Surgery;  Laterality: N/A;   TEE WITHOUT CARDIOVERSION N/A 03/04/2021   Procedure: TRANSESOPHAGEAL ECHOCARDIOGRAM (TEE);  Surgeon: Lelon Perla, MD;  Location: [facility] ENDOSCOPY;  Service: Cardiovascular;  Laterality: N/A;   UMBILICAL HERNIA REPAIR  01-15-09   W/ GASTRIC BANDING PROCEDURE    Current Outpatient Medications  Medication Sig Dispense Refill   acetaminophen (TYLENOL) 325 MG tablet Take 2 tablets (650 mg total) by mouth  every 6 (six) hours as needed for mild pain (or Fever >/= 101).     Alirocumab (PRALUENT) 75 MG/ML SOAJ Inject 75 mg into the skin every 14 (fourteen) days.     allopurinol (ZYLOPRIM) 100 MG tablet Take 0.5 tablets (50 mg total) by mouth 2 (two) times a week. Please discuss dosing with your PCP based on your renal function. 4 tablet 0   amiodarone (PACERONE) 200 MG tablet Take 0.5 tablets (100 mg total) by mouth daily. 60 tablet 0   apixaban (ELIQUIS) 5 MG TABS tablet Take 1 tablet (5 mg total) by mouth 2 (two) times daily. 180 tablet 3   Ascorbic Acid (VITAMIN C WITH ROSE HIPS) 1000 MG tablet Take 1,000 mg by mouth daily.     cetirizine (ZYRTEC) 10 MG tablet Take 10 mg by mouth daily.     colchicine 0.6 MG tablet Take 0.6-1.2 mg by mouth 2 (two) times daily as needed (gout).      Continuous Blood Gluc Sensor (DEXCOM G7 SENSOR) MISC 3 each by Does not apply route every 30 (thirty) days. Apply 1 sensor every 10 days 9 each 4   cycloSPORINE (RESTASIS) 0.05 % ophthalmic emulsion Place 1 drop into both eyes 2 (two) times daily. As needed     diclofenac Sodium (VOLTAREN) 1 % GEL Apply 2 g topically 3 (three) times daily as needed for pain.     EFUDEX 5 % cream Apply 1 application topically daily.     ezetimibe (ZETIA) 10 MG tablet Take 1 tablet (10 mg total) by mouth daily. 90 tablet 3   fluticasone (FLONASE) 50 MCG/ACT nasal spray Place 1 spray into both nostrils daily as needed for allergies or rhinitis.     furosemide (LASIX) 40 MG tablet Take 40 mg by mouth daily.     furosemide (LASIX) 80 MG tablet Take 80 mg by mouth every other day.     HYDROcodone-acetaminophen (NORCO/VICODIN) 5-325 MG tablet Take 1 tablet by mouth as needed.     methocarbamol (ROBAXIN) 500 MG tablet Take 500 mg by mouth 3 (three) times daily as needed for muscle spasms.     Multiple Vitamin (MULTIVITAMIN) capsule Take 1 capsule by mouth daily.     nitroGLYCERIN (NITROSTAT) 0.4 MG SL tablet 1 TAB UNDER THE TONGUE EVERY 5  MINUTES AS NEEDED FOR CHEST  PAIN UP TO 3 DOSES, IF PERSIST CALL 911 25 tablet 3   ONETOUCH VERIO test strip 1 each daily.     pantoprazole (PROTONIX) 40 MG tablet Take 40 mg by mouth daily as needed (heartburn).     Semaglutide,0.25 or 0.'5MG'$ /DOS, (OZEMPIC, 0.25 OR 0.5 MG/DOSE,) 2 MG/1.5ML SOPN Inject 0.5 mg into the skin once a week. 4.5 mL 3   sildenafil (VIAGRA) 100 MG tablet Take 50 mg by mouth daily as needed for erectile dysfunction.     traZODone (DESYREL) 100 MG tablet Take 100 mg by mouth at bedtime as needed for sleep.     No current facility-administered medications for this encounter.    Allergies  Allergen Reactions   Crestor [Rosuvastatin] Other (See Comments)    Muscle aches    Lipitor [Atorvastatin] Other (See Comments)    Muscle aches   Pravastatin Other (See Comments)    Muscle aches   Carvedilol     Other reaction(s): loss of appetite   Metformin Diarrhea   Serotonin     Other reaction(s): Other (See Comments) Other reaction(s): Muscle aches   Serotonin Reuptake Inhibitors (Ssris)     Other reaction(s): Muscle aches   Zocor [Simvastatin]     Other reaction(s): Muscle pain   Codeine Itching and Other (See Comments)    Extremity tingling-- can take synthetic    Social History   Socioeconomic History   Marital status: Married    Spouse name: [name]   Number of children: 1   Years of education: Not on file   Highest education level: Master's degree (e.g., MA, MS, MEng, MEd, MSW, MBA)  Occupational History    Comment: Chief Financial Officer retired  Tobacco Use   Smoking status: Never   Smokeless tobacco: Never   Tobacco comments:    Never smoke 04/08/22  Vaping Use   Vaping Use: Never used  Substance and Sexual Activity   Alcohol use: Not Currently    Alcohol/week: 2.0 standard drinks of alcohol    Types: 1 Glasses of wine, 1 Shots of liquor per week    Comment: OCCASIONAL   Drug use: Never   Sexual activity: Not on file  Other Topics Concern    Not on file  Social History Narrative   Lives with wife   occas soda   Social Determinants of Health   Financial Resource Strain: Not on file  Food Insecurity: No Food Insecurity (03/27/2021)   Hunger Vital Sign    Worried About Running Out of Food in the Last Year: Never true    [place] in the Last Year: Never true  Transportation Needs: No Transportation Needs (03/27/2021)   PRAPARE - Hydrologist (Medical): No    Lack of Transportation (Non-Medical): No  Physical Activity: Not on file  Stress: Not on file  Social Connections: Not on file  Intimate Partner Violence: Not on file    Family History  Problem Relation Age of Onset   Other Mother        joint problems   Alzheimer's disease Mother    Hypertension Father    Heart disease Father    CVA Father        age 10   Depression Father    Heart disease Sister        CABG   Stroke Sister    Heart failure Sister        congestive   Diabetes Sister    Heart  disease Brother    Hypertension Brother    Congestive Heart Failure Brother     ROS- All systems are reviewed and negative except as per the HPI above  Physical Exam: Vitals:   04/08/22 1440  BP: 124/72  Pulse: 69  Weight: 89.4 kg  Height: '5\' 9"'$  (1.753 m)   Wt Readings from Last 3 Encounters:  04/08/22 89.4 kg  03/23/22 88 kg  03/16/22 88.5 kg    Labs: Lab Results  Component Value Date   NA 140 12/18/2021   K 4.5 12/18/2021   CL 105 12/18/2021   CO2 26 12/18/2021   GLUCOSE 124 (H) 12/18/2021   BUN 40 (H) 12/18/2021   CREATININE 4.04 (H) 12/18/2021   CALCIUM 10.8 (H) 12/18/2021   PHOS 3.7 01/10/2021   MG 2.0 01/10/2021   Lab Results  Component Value Date   INR 1.6 (H) 02/01/2021   Lab Results  Component Value Date   CHOL 171 12/18/2021   HDL 50 12/18/2021   LDLCALC 100 (H) 12/18/2021   TRIG 106 12/18/2021     GEN- The patient is a well appearing elderly male, alert and oriented x 3 today.    HEENT-head normocephalic, atraumatic, sclera clear, conjunctiva pink, hearing intact, trachea midline. Lungs- Clear to ausculation bilaterally, normal work of breathing Heart- irregular rate and rhythm, no murmurs, rubs or gallops  GI- soft, NT, ND, + BS Extremities- no clubbing, cyanosis, or edema MS- no significant deformity or atrophy Skin- no rash or lesion Psych- euthymic mood, full affect Neuro- strength and sensation are intact   EKG- Afib, RBBB, LAFB Vent. rate 69 BPM PR interval * ms QRS duration 160 ms QT/QTcB 470/503 ms   CHA2DS2-VASc Score = 8  The patient's score is based upon: CHF History: 1 HTN History: 1 Diabetes History: 1 Stroke History: 2 Vascular Disease History: 1 Age Score: 2 Gender Score: 0       ASSESSMENT AND PLAN: 1. Persistent Atrial Fibrillation/atrial flutter/atrial tachycardia The patient's CHA2DS2-VASc score is 8, indicating a 10.8% annual risk of stroke.   Patient in symptomatic, rate controlled afib.  We discussed rhythm control options today.  Will increase amiodarone to 200 mg daily and plan for DCCV.  Check cmet/TSH/cbc today.  Continue Eliquis 5 mg BID  2. Secondary Hypercoagulable State (ICD10:  D68.69) The patient is at significant risk for stroke/thromboembolism based upon his CHA2DS2-VASc Score of 8.  Continue Apixaban (Eliquis).   3. Chronic HFpEF Followed in [facility] Fluid status stable today.   4. CAD  S/p CABG On Praluent  No anginal symptoms.  5. MR S/p mitral valve repair 2017  6. OSA The importance of adequate treatment of sleep apnea was discussed today in order to improve our ability to maintain sinus rhythm long term. Encouraged compliance with CPAP therapy.  7. HTN Stable, no changes today.   Follow up with Dr Aundra Dubin as scheduled post DCCV.   [facility] [address] [place], [place] [address] [phone]

## 2022-04-09 ENCOUNTER — Other Ambulatory Visit (HOSPITAL_COMMUNITY): Payer: Self-pay | Admitting: *Deleted

## 2022-04-09 DIAGNOSIS — I4819 Other persistent atrial fibrillation: Secondary | ICD-10-CM

## 2022-04-10 ENCOUNTER — Encounter (HOSPITAL_BASED_OUTPATIENT_CLINIC_OR_DEPARTMENT_OTHER): Payer: Self-pay

## 2022-04-10 ENCOUNTER — Other Ambulatory Visit: Payer: Self-pay

## 2022-04-10 ENCOUNTER — Emergency Department (HOSPITAL_BASED_OUTPATIENT_CLINIC_OR_DEPARTMENT_OTHER): Payer: Medicare Other

## 2022-04-10 ENCOUNTER — Inpatient Hospital Stay (HOSPITAL_BASED_OUTPATIENT_CLINIC_OR_DEPARTMENT_OTHER)
Admission: EM | Admit: 2022-04-10 | Discharge: 2022-04-14 | DRG: 660 | Disposition: A | Discharge status: Home / Self Care | Payer: Medicare Other | Attending: Internal Medicine | Admitting: Internal Medicine

## 2022-04-10 DIAGNOSIS — E782 Mixed hyperlipidemia: Secondary | ICD-10-CM | POA: Diagnosis present

## 2022-04-10 DIAGNOSIS — I130 Hypertensive heart and chronic kidney disease with heart failure and stage 1 through stage 4 chronic kidney disease, or unspecified chronic kidney disease: Secondary | ICD-10-CM | POA: Diagnosis present

## 2022-04-10 DIAGNOSIS — D6869 Other thrombophilia: Secondary | ICD-10-CM | POA: Diagnosis present

## 2022-04-10 DIAGNOSIS — I129 Hypertensive chronic kidney disease with stage 1 through stage 4 chronic kidney disease, or unspecified chronic kidney disease: Secondary | ICD-10-CM | POA: Diagnosis not present

## 2022-04-10 DIAGNOSIS — Z833 Family history of diabetes mellitus: Secondary | ICD-10-CM

## 2022-04-10 DIAGNOSIS — R1031 Right lower quadrant pain: Secondary | ICD-10-CM | POA: Diagnosis not present

## 2022-04-10 DIAGNOSIS — I4811 Longstanding persistent atrial fibrillation: Secondary | ICD-10-CM | POA: Diagnosis present

## 2022-04-10 DIAGNOSIS — I5032 Chronic diastolic (congestive) heart failure: Secondary | ICD-10-CM | POA: Diagnosis present

## 2022-04-10 DIAGNOSIS — I471 Supraventricular tachycardia: Secondary | ICD-10-CM | POA: Diagnosis present

## 2022-04-10 DIAGNOSIS — Z8701 Personal history of pneumonia (recurrent): Secondary | ICD-10-CM

## 2022-04-10 DIAGNOSIS — I2510 Atherosclerotic heart disease of native coronary artery without angina pectoris: Secondary | ICD-10-CM | POA: Diagnosis present

## 2022-04-10 DIAGNOSIS — Z8249 Family history of ischemic heart disease and other diseases of the circulatory system: Secondary | ICD-10-CM | POA: Diagnosis not present

## 2022-04-10 DIAGNOSIS — N179 Acute kidney failure, unspecified: Secondary | ICD-10-CM | POA: Diagnosis present

## 2022-04-10 DIAGNOSIS — Z885 Allergy status to narcotic agent status: Secondary | ICD-10-CM

## 2022-04-10 DIAGNOSIS — I482 Chronic atrial fibrillation, unspecified: Secondary | ICD-10-CM | POA: Diagnosis not present

## 2022-04-10 DIAGNOSIS — Z952 Presence of prosthetic heart valve: Secondary | ICD-10-CM

## 2022-04-10 DIAGNOSIS — I25810 Atherosclerosis of coronary artery bypass graft(s) without angina pectoris: Secondary | ICD-10-CM | POA: Diagnosis present

## 2022-04-10 DIAGNOSIS — Z79899 Other long term (current) drug therapy: Secondary | ICD-10-CM

## 2022-04-10 DIAGNOSIS — G4733 Obstructive sleep apnea (adult) (pediatric): Secondary | ICD-10-CM | POA: Diagnosis present

## 2022-04-10 DIAGNOSIS — E1122 Type 2 diabetes mellitus with diabetic chronic kidney disease: Secondary | ICD-10-CM | POA: Diagnosis present

## 2022-04-10 DIAGNOSIS — Z8673 Personal history of transient ischemic attack (TIA), and cerebral infarction without residual deficits: Secondary | ICD-10-CM

## 2022-04-10 DIAGNOSIS — N184 Chronic kidney disease, stage 4 (severe): Secondary | ICD-10-CM | POA: Diagnosis present

## 2022-04-10 DIAGNOSIS — N201 Calculus of ureter: Secondary | ICD-10-CM | POA: Diagnosis not present

## 2022-04-10 DIAGNOSIS — Z951 Presence of aortocoronary bypass graft: Secondary | ICD-10-CM

## 2022-04-10 DIAGNOSIS — Z823 Family history of stroke: Secondary | ICD-10-CM | POA: Diagnosis not present

## 2022-04-10 DIAGNOSIS — Z85828 Personal history of other malignant neoplasm of skin: Secondary | ICD-10-CM

## 2022-04-10 DIAGNOSIS — I4892 Unspecified atrial flutter: Secondary | ICD-10-CM | POA: Diagnosis present

## 2022-04-10 DIAGNOSIS — I110 Hypertensive heart disease with heart failure: Secondary | ICD-10-CM | POA: Diagnosis not present

## 2022-04-10 DIAGNOSIS — M109 Gout, unspecified: Secondary | ICD-10-CM | POA: Diagnosis present

## 2022-04-10 DIAGNOSIS — R109 Unspecified abdominal pain: Secondary | ICD-10-CM | POA: Diagnosis not present

## 2022-04-10 DIAGNOSIS — I700 Atherosclerosis of aorta: Secondary | ICD-10-CM | POA: Diagnosis not present

## 2022-04-10 DIAGNOSIS — Z818 Family history of other mental and behavioral disorders: Secondary | ICD-10-CM | POA: Diagnosis not present

## 2022-04-10 DIAGNOSIS — I509 Heart failure, unspecified: Secondary | ICD-10-CM | POA: Diagnosis not present

## 2022-04-10 DIAGNOSIS — Z96651 Presence of right artificial knee joint: Secondary | ICD-10-CM | POA: Diagnosis present

## 2022-04-10 DIAGNOSIS — Z9989 Dependence on other enabling machines and devices: Secondary | ICD-10-CM | POA: Diagnosis not present

## 2022-04-10 DIAGNOSIS — Z955 Presence of coronary angioplasty implant and graft: Secondary | ICD-10-CM

## 2022-04-10 DIAGNOSIS — N132 Hydronephrosis with renal and ureteral calculous obstruction: Secondary | ICD-10-CM | POA: Diagnosis present

## 2022-04-10 DIAGNOSIS — E119 Type 2 diabetes mellitus without complications: Secondary | ICD-10-CM

## 2022-04-10 DIAGNOSIS — I252 Old myocardial infarction: Secondary | ICD-10-CM

## 2022-04-10 DIAGNOSIS — Z888 Allergy status to other drugs, medicaments and biological substances status: Secondary | ICD-10-CM

## 2022-04-10 DIAGNOSIS — Z7901 Long term (current) use of anticoagulants: Secondary | ICD-10-CM | POA: Diagnosis not present

## 2022-04-10 DIAGNOSIS — K8020 Calculus of gallbladder without cholecystitis without obstruction: Secondary | ICD-10-CM | POA: Diagnosis present

## 2022-04-10 DIAGNOSIS — I4891 Unspecified atrial fibrillation: Secondary | ICD-10-CM | POA: Diagnosis not present

## 2022-04-10 DIAGNOSIS — Z8719 Personal history of other diseases of the digestive system: Secondary | ICD-10-CM

## 2022-04-10 DIAGNOSIS — Z9884 Bariatric surgery status: Secondary | ICD-10-CM

## 2022-04-10 DIAGNOSIS — Z820 Family history of epilepsy and other diseases of the nervous system: Secondary | ICD-10-CM

## 2022-04-10 LAB — CBC
HCT: 38.5 % — ABNORMAL LOW (ref 39.0–52.0)
Hemoglobin: 13 g/dL (ref 13.0–17.0)
MCH: 32.1 pg (ref 26.0–34.0)
MCHC: 33.8 g/dL (ref 30.0–36.0)
MCV: 95.1 fL (ref 80.0–100.0)
Platelets: 155 10*3/uL (ref 150–400)
RBC: 4.05 MIL/uL — ABNORMAL LOW (ref 4.22–5.81)
RDW: 12.7 % (ref 11.5–15.5)
WBC: 12.5 10*3/uL — ABNORMAL HIGH (ref 4.0–10.5)
nRBC: 0 % (ref 0.0–0.2)

## 2022-04-10 LAB — COMPREHENSIVE METABOLIC PANEL
ALT: 12 U/L (ref 0–44)
AST: 15 U/L (ref 15–41)
Albumin: 4.6 g/dL (ref 3.5–5.0)
Alkaline Phosphatase: 109 U/L (ref 38–126)
Anion gap: 13 (ref 5–15)
BUN: 59 mg/dL — ABNORMAL HIGH (ref 8–23)
CO2: 21 mmol/L — ABNORMAL LOW (ref 22–32)
Calcium: 10 mg/dL (ref 8.9–10.3)
Chloride: 99 mmol/L (ref 98–111)
Creatinine, Ser: 5.58 mg/dL — ABNORMAL HIGH (ref 0.61–1.24)
GFR, Estimated: 10 mL/min — ABNORMAL LOW (ref >60)
Glucose, Bld: 141 mg/dL — ABNORMAL HIGH (ref 70–99)
Potassium: 4.3 mmol/L (ref 3.5–5.1)
Sodium: 133 mmol/L — ABNORMAL LOW (ref 135–145)
Total Bilirubin: 0.8 mg/dL (ref 0.3–1.2)
Total Protein: 7.2 g/dL (ref 6.5–8.1)

## 2022-04-10 LAB — URINALYSIS, ROUTINE W REFLEX MICROSCOPIC
Bilirubin Urine: NEGATIVE
Glucose, UA: NEGATIVE mg/dL
Ketones, ur: NEGATIVE mg/dL
Leukocytes,Ua: NEGATIVE
Nitrite: NEGATIVE
Protein, ur: 30 mg/dL — AB
Specific Gravity, Urine: 1.013 (ref 1.005–1.030)
pH: 5.5 (ref 5.0–8.0)

## 2022-04-10 LAB — CBG MONITORING, ED: Glucose-Capillary: 117 mg/dL — ABNORMAL HIGH (ref 70–99)

## 2022-04-10 LAB — LIPASE, BLOOD: Lipase: 42 U/L (ref 11–51)

## 2022-04-10 MED ORDER — TRAZODONE HCL 100 MG PO TABS
100.0000 mg | ORAL_TABLET | Freq: Every day | ORAL | Status: DC
  Administered 2022-04-10 – 2022-04-13 (×4): 100 mg via ORAL
  Filled 2022-04-10 (×5): qty 1

## 2022-04-10 MED ORDER — HYDROMORPHONE HCL 2 MG/ML IJ SOLN
0.5000 mg | INTRAMUSCULAR | Status: DC | PRN
  Administered 2022-04-10 – 2022-04-11 (×3): 0.5 mg via INTRAVENOUS
  Filled 2022-04-10 (×3): qty 1

## 2022-04-10 MED ORDER — SODIUM CHLORIDE 0.9 % IV SOLN: INTRAVENOUS | Status: DC

## 2022-04-10 MED ORDER — APIXABAN 2.5 MG PO TABS
5.0000 mg | ORAL_TABLET | Freq: Two times a day (BID) | ORAL | Status: DC
  Administered 2022-04-12 – 2022-04-14 (×5): 5 mg via ORAL
  Filled 2022-04-10 (×5): qty 2

## 2022-04-10 MED ORDER — SODIUM CHLORIDE 0.9 % IV BOLUS
500.0000 mL | Freq: Once | INTRAVENOUS | Status: AC
  Administered 2022-04-10: 500 mL via INTRAVENOUS

## 2022-04-10 MED ORDER — SODIUM CHLORIDE 0.9 % IV SOLN: Freq: Once | INTRAVENOUS | Status: DC

## 2022-04-10 MED ORDER — INSULIN ASPART 100 UNIT/ML IJ SOLN
0.0000 [IU] | Freq: Three times a day (TID) | INTRAMUSCULAR | Status: DC
  Administered 2022-04-11: 2 [IU] via SUBCUTANEOUS
  Administered 2022-04-11: 1 [IU] via SUBCUTANEOUS
  Administered 2022-04-12: 2 [IU] via SUBCUTANEOUS
  Administered 2022-04-12: 3 [IU] via SUBCUTANEOUS
  Administered 2022-04-12: 2 [IU] via SUBCUTANEOUS
  Administered 2022-04-13 (×3): 1 [IU] via SUBCUTANEOUS
  Filled 2022-04-10: qty 0.09

## 2022-04-10 MED ORDER — EZETIMIBE 10 MG PO TABS
10.0000 mg | ORAL_TABLET | Freq: Every day | ORAL | Status: DC
  Administered 2022-04-12 – 2022-04-13 (×2): 10 mg via ORAL
  Filled 2022-04-10 (×4): qty 1

## 2022-04-10 MED ORDER — PROCHLORPERAZINE EDISYLATE 10 MG/2ML IJ SOLN: 10.0000 mg | Freq: Four times a day (QID) | INTRAMUSCULAR | Status: DC | PRN

## 2022-04-10 MED ORDER — ACETAMINOPHEN 325 MG PO TABS: 650.0000 mg | ORAL_TABLET | Freq: Four times a day (QID) | ORAL | Status: DC | PRN

## 2022-04-10 MED ORDER — HYDROMORPHONE HCL 1 MG/ML IJ SOLN
0.5000 mg | Freq: Once | INTRAMUSCULAR | Status: AC
  Administered 2022-04-10: 0.5 mg via INTRAVENOUS
  Filled 2022-04-10: qty 1

## 2022-04-10 MED ORDER — AMIODARONE HCL 200 MG PO TABS
200.0000 mg | ORAL_TABLET | Freq: Every day | ORAL | Status: DC
  Administered 2022-04-12 – 2022-04-13 (×2): 200 mg via ORAL
  Filled 2022-04-10 (×4): qty 1

## 2022-04-10 MED ORDER — ACETAMINOPHEN 650 MG RE SUPP: 650.0000 mg | Freq: Four times a day (QID) | RECTAL | Status: DC | PRN

## 2022-04-10 MED ORDER — FENTANYL CITRATE PF 50 MCG/ML IJ SOSY
50.0000 ug | PREFILLED_SYRINGE | Freq: Once | INTRAMUSCULAR | Status: AC
  Administered 2022-04-10: 50 ug via INTRAVENOUS
  Filled 2022-04-10: qty 1

## 2022-04-10 MED ORDER — SODIUM CHLORIDE 0.9% FLUSH
3.0000 mL | Freq: Two times a day (BID) | INTRAVENOUS | Status: DC
  Administered 2022-04-10 – 2022-04-13 (×6): 3 mL via INTRAVENOUS

## 2022-04-10 MED ORDER — SENNOSIDES-DOCUSATE SODIUM 8.6-50 MG PO TABS: 1.0000 | ORAL_TABLET | Freq: Every evening | ORAL | Status: DC | PRN

## 2022-04-10 NOTE — ED Triage Notes (Signed)
Patient here POV from Home.  Endorses ABD Pain that began Wednesday and has continued since. Worsened somewhat since. Right Flank and radiates to RLQ.   No N/V/D. No Fevers. No Dysuria.  Seen at [facility] and sent for Possible Imaging. History of Diverticulosis.   NAD Noted during Triage. A&Ox4. GCS 15. Ambulatory

## 2022-04-10 NOTE — Assessment & Plan Note (Signed)
Hold Ozempic and placed on SSI.

## 2022-04-10 NOTE — Assessment & Plan Note (Signed)
Remains in atrial fibrillation with controlled rate. -Continue amiodarone -Continue Eliquis -Scheduled for cardioversion with Dr. Aundra Dubin on 04/13/2022

## 2022-04-10 NOTE — Progress Notes (Signed)
Patient remains in ED. Spoke with him about nocturnal CPAP. He uses Nasal Pillows at home but does not have his apparatus with him tonight. He does not wish to try a nasal mask at this time due to "skin cancer and the straps make me bleed". However, he does agree to try a nasal mask for tonight once he is moved to a regular room. He also states that his wife will bring his home apparatus in tomorrow for nightly use if he remains inpatient status. RN made aware of plan. RT will continue to follow and assist with CPAP once he transfers to the floor.

## 2022-04-10 NOTE — ED Notes (Signed)
Attempted to call report, no answer; will try again.

## 2022-04-10 NOTE — ED Notes (Signed)
Patient transported to CT 

## 2022-04-10 NOTE — ED Notes (Signed)
Pt states that pain is easing down.

## 2022-04-10 NOTE — Assessment & Plan Note (Signed)
Continue Zetia.  Not on statin due to side effect of myalgias.

## 2022-04-10 NOTE — Assessment & Plan Note (Signed)
Stable without chest pain.  Continue Eliquis and Zetia.

## 2022-04-10 NOTE — Assessment & Plan Note (Signed)
Presenting with right flank pain due to mid right ureteral stone associated with mild hydronephrosis.  Urology aware, no specific recommendations other than keeping n.p.o. after midnight. -Management per urology -Continue analgesics as needed

## 2022-04-10 NOTE — Assessment & Plan Note (Addendum)
Creatinine 5.58 on admit compared to baseline between 3.2-4.0.  Likely worsened in setting of ureteral stone as above.  Has AVF in place. -Continue gentle IV fluids with NS'@100'$  mL overnight -Strict I/O's

## 2022-04-10 NOTE — H&P (Signed)
History and Physical    [name] YIR:[id number] DOB: March 27, 1946 DOA: 04/10/2022  PCP: [name], MD  Patient coming from: Home  I have personally briefly reviewed patient's old medical records in [place]  Chief Complaint: Right flank pain  HPI: [name] is a 76 y.o. male with medical history significant for persistent atrial fibrillation on Eliquis, chronic HFpEF (EF 55-60% 03/2021), s/p mitral valve repair with history of prosthetic valve endocarditis, CAD s/p CABG, CKD stage IV, history of CVA, T2DM, HTN, HLD, OSA on CPAP who presented to the ED for evaluation of right flank pain.  Patient reports new onset of right flank and right lower abdominal pain beginning evening of 8/17.  He reports a history of diverticulitis and thought that is what he was experiencing again.  He is having nausea but no emesis.  Due to persistent symptoms he went to the ED for further evaluation.  He reports good urine output without dysuria.  He denies any subjective fevers, chills, diaphoresis, dyspnea, diarrhea, peripheral edema.  ED Course  Labs/Imaging on admission: I have personally reviewed following labs and imaging studies.  Initially presented to [place].  Initial vital showed BP 137/84, pulse 73, RR 16, temp 97.8 F, SPO2 100% on room air.  Labs from WBC 12.5, hemoglobin 13.0, platelets 155,000, sodium 133, potassium 4.3, bicarb 21, BUN 59, creatinine 5.58 (baseline varies from 3.2-4.0 on recent labs), serum glucose 141, LFTs within normal limits, lipase 42.  Urinalysis negative for UTI.  CT A/P without contrast showed an 8 mm mid right ureteral stone with mild right hydronephrosis, right nephrolithiasis, cholelithiasis.  EDP discussed with on-call urology, Dr. Alyson Ingles, who recommended ED to ED transfer for expedited evaluation at [facility].  Recommendation was to keep n.p.o. after midnight and medical admission.  The hospitalist service was consulted  to admit for further evaluation and management.  Review of Systems: All systems reviewed and are negative except as documented in history of present illness above.   Past Medical History:  Diagnosis Date   Allergic rhinitis    Anxiety    Atrial fibrillation (HCC)    Cancer (HCC)    hx of skin cancers    Chronic kidney disease    Coronary artery disease    a. s/p MI & CABG; b. s/p PCI Diag;  c. Cath 12/2011: LAD diff dzs/small, Diag 75% isr, 90 dist to stent (small), LCX & RCA occluded, VG->OM 40, VG->PDA patent - med rx.   COVID-19    x 3   Depression    PTSD   Diabetes mellitus    not on medications   Diastolic CHF, chronic ([place])    a. EF 55-60% by echo 2007   GERD (gastroesophageal reflux disease)    "not anymore" " I had a lap band"   History of diverticulitis of colon    5 YRS AGO   History of pneumonia    2017   History of transient ischemic attack (TIA)    CAROTID DOPPLERS NOV 2011  0-39& BIL. STENOSIS   Hyperlipidemia    Hypertension    Kidney failure    Mitral regurgitation    Myocardial infarction [facility])    Neuromuscular disorder (HCC)    left arm numbness    OA (osteoarthritis)    RIGHT KNEE ARTHOFIBROSIS W/ PAIN  (S/P  REPLACEMENT 2004)   PTSD (post-traumatic stress disorder)    from [place] since 1973   S/P minimally invasive mitral valve  repair 03/25/2016   Complex valvuloplasty including artificial Gore-tex neochord placement x4, plication of anterior commissure, and 26 mm Sorin Memo 3D ring annuloplasty via right mini thoracotomy approach   Shortness of breath dyspnea    with exertion   Skin cancer    Sleep apnea    cpap- see ov note in EPIC 05/12/11 for settings    Stroke [facility])    hx of   Stroke [facility])     Past Surgical History:  Procedure Laterality Date   AV FISTULA PLACEMENT Left 08/02/2018   Procedure: ARTERIOVENOUS (AV) FISTULA CREATION RADIOCEPHALIC;  Surgeon: Angelia Mould, MD;  Location: [place];  Service: Vascular;  Laterality:  Left;   [place]  2001, 2004, 2009, 2011   CARDIAC CATHETERIZATION N/A 01/23/2016   Procedure: Right/Left Heart Cath and Coronary Angiography;  Surgeon: [name], MD;  Location: [place] CV LAB;  Service: Cardiovascular;  Laterality: N/A;   CARDIOVERSION N/A 09/07/2017   Procedure: CARDIOVERSION;  Surgeon: [name], MD;  Location: [facility] ENDOSCOPY;  Service: Cardiovascular;  Laterality: N/A;   CARDIOVERSION N/A 09/13/2019   Procedure: CARDIOVERSION;  Surgeon: [name], MD;  Location: [facility] ENDOSCOPY;  Service: Cardiovascular;  Laterality: N/A;   CARDIOVERSION N/A 10/19/2019   Procedure: CARDIOVERSION;  Surgeon: [name], MD;  Location: [facility] ENDOSCOPY;  Service: Cardiovascular;  Laterality: N/A;   CATARACT EXTRACTION W/ INTRAOCULAR LENS IMPLANT Bilateral    CHONDROPLASTY  07/22/2011   Procedure: CHONDROPLASTY;  Surgeon: Dione Plover Aluisio;  Location: [place];  Service: Orthopedics;;   CIRCUMCISION  [place]-- POST CABG   W/ STENT, last cath 01/07/2012    CORONARY ARTERY BYPASS GRAFT  1995   X3 VESSEL   CORONARY ARTERY BYPASS GRAFT  1995   CORONARY STENT PLACEMENT     CYSTOSCOPY/URETEROSCOPY/HOLMIUM LASER/STENT PLACEMENT Right 01/08/2021   Procedure: CYSTOSCOPY/RETROGRADE/URETEROSCOPY/HOLMIUM LASER/STENT PLACEMENT;  Surgeon: Franchot Gallo, MD;  Location: [facility] ORS;  Service: Urology;  Laterality: Right;   INCISION AND DRAINAGE PERIRECTAL ABSCESS N/A 09/01/2021   Procedure: IRRIGATION AND DEBRIDEMENT PERIRECTAL ABSCESS;  Surgeon: Kieth Brightly Arta Bruce, MD;  Location: [facility] ORS;  Service: General;  Laterality: N/A;   IR PERC TUN PERIT CATH WO PORT S&I [place]  03/06/2021   IR REMOVAL TUN CV CATH W/O FL  04/14/2021   KNEE ARTHROSCOPY  07/22/2011   Procedure: ARTHROSCOPY KNEE;  Surgeon: [name];  Location: [place];  Service: Orthopedics;   Laterality: Right;  WITH DEBRIDEMENT    KNEE ARTHROSCOPY W/ MENISCECTOMY  X2 IN 2002-- RIGHT KNEE   LAPAROSCOPIC GASTRIC BANDING  01-15-09   LEFT HEART CATH AND CORONARY ANGIOGRAPHY N/A 04/29/2017   Procedure: LEFT HEART CATH AND CORONARY ANGIOGRAPHY;  Surgeon: [name], MD;  Location: [place] CV LAB;  Service: Cardiovascular;  Laterality: N/A;   LEFT HEART CATHETERIZATION WITH CORONARY ANGIOGRAM N/A 09/11/2013   Procedure: LEFT HEART CATHETERIZATION WITH CORONARY ANGIOGRAM;  Surgeon: [name], MD;  Location: [facility] CATH LAB;  Service: Cardiovascular;  Laterality: N/A;   MITRAL VALVE REPAIR Right 03/25/2016   Procedure: MINIMALLY INVASIVE REOPERATION FOR MITRAL VALVE REPAIR  (MVR) with size 26 Sorin Memo 3D;  Surgeon: [name], MD;  Location: [place];  Service: Open Heart Surgery;  Laterality: Right;   MITRAL VALVE REPAIR  03/2016   RIGHT KNEE ED COMPARTMENT REPLACEMENT  2004   SYNOVECTOMY  07/22/2011   Procedure: SYNOVECTOMY;  Surgeon: [name];  Location: [place];  Service: Orthopedics;;   TEE WITHOUT CARDIOVERSION N/A 01/23/2016   Procedure: TRANSESOPHAGEAL ECHOCARDIOGRAM (TEE);  Surgeon: [name], MD;  Location: [place];  Service: Cardiovascular;  Laterality: N/A;   TEE WITHOUT CARDIOVERSION N/A 03/25/2016   Procedure: TRANSESOPHAGEAL ECHOCARDIOGRAM (TEE);  Surgeon: [name], MD;  Location: [place];  Service: Open Heart Surgery;  Laterality: N/A;   TEE WITHOUT CARDIOVERSION N/A 03/04/2021   Procedure: TRANSESOPHAGEAL ECHOCARDIOGRAM (TEE);  Surgeon: Lelon Perla, MD;  Location: [facility] ENDOSCOPY;  Service: Cardiovascular;  Laterality: N/A;   UMBILICAL HERNIA REPAIR  01-15-09   W/ GASTRIC BANDING PROCEDURE    Social History:  reports that he has never smoked. He has never used smokeless tobacco. He reports that he does not currently use alcohol after a past usage of about 2.0 standard drinks of alcohol per week. He reports that he does  not use drugs.  Allergies  Allergen Reactions   Crestor [Rosuvastatin] Other (See Comments)    Muscle aches    Lipitor [Atorvastatin] Other (See Comments)    Muscle aches   Pravastatin Other (See Comments)    Muscle aches   Carvedilol Other (See Comments)    loss of appetite   Metformin Diarrhea   Serotonin Other (See Comments)    Muscle aches   Serotonin Reuptake Inhibitors (Ssris) Other (See Comments)    Muscle aches   Zocor [Simvastatin] Other (See Comments)     Muscle pain   Codeine Itching and Other (See Comments)    Extremity tingling-- can take synthetic    Family History  Problem Relation Age of Onset   Other Mother        joint problems   Alzheimer's disease Mother    Hypertension Father    Heart disease Father    CVA Father        age 81   Depression Father    Heart disease Sister        CABG   Stroke Sister    Heart failure Sister        congestive   Diabetes Sister    Heart disease Brother    Hypertension Brother    Congestive Heart Failure Brother      Prior to Admission medications   Medication Sig Start Date End Date Taking? Authorizing Provider  Alirocumab (PRALUENT) 75 MG/ML SOAJ Inject 75 mg into the skin every 14 (fourteen) days.   Yes [provider]  allopurinol (ZYLOPRIM) 100 MG tablet Take 50 mg by mouth 2 (two) times a week.   Yes [provider]  amiodarone (PACERONE) 200 MG tablet Take 0.5 tablets (100 mg total) by mouth daily. Patient taking differently: Take 200 mg by mouth in the morning. 04/08/21  Yes [name], [name], FNP  apixaban (ELIQUIS) 5 MG TABS tablet Take 1 tablet (5 mg total) by mouth 2 (two) times daily. 09/05/21  Yes [name], MD  cetirizine (ZYRTEC) 10 MG tablet Take 10 mg by mouth in the morning.   Yes [provider]  colchicine 0.6 MG tablet Take 0.6-1.2 mg by mouth 2 (two) times daily as needed (gout).  04/14/19  Yes [provider]  Cyanocobalamin (VITAMIN B-12) 5000 MCG  TBDP Take 5,000 mcg by mouth 2 (two) times a week.   Yes [provider]  cycloSPORINE (RESTASIS) 0.05 % ophthalmic emulsion Place 1 drop into  both eyes 2 (two) times daily as needed (eye irritation.).   Yes [provider]  diclofenac Sodium (VOLTAREN) 1 % GEL Apply 2 g topically 3 (three) times daily as needed for pain. 07/05/21  Yes [provider]  ezetimibe (ZETIA) 10 MG tablet Take 1 tablet (10 mg total) by mouth daily. 12/26/21  Yes [name], MD  fluticasone [facility]) 50 MCG/ACT nasal spray Place 1 spray into both nostrils daily as needed for allergies or rhinitis.   Yes [provider]  furosemide (LASIX) 40 MG tablet Take 40 mg by mouth See admin instructions. Take 1 tablet (40 mg) by mouth every other day alternating with 80 mg dosage 04/08/22  Yes [provider]  HYDROcodone-acetaminophen (NORCO/VICODIN) 5-325 MG tablet Take 1 tablet by mouth every 6 (six) hours as needed for moderate pain or severe pain.   Yes [provider]  methocarbamol (ROBAXIN) 500 MG tablet Take 500 mg by mouth 3 (three) times daily as needed for muscle spasms. 08/20/21  Yes [provider]  Multiple Vitamin (MULTIVITAMIN) capsule Take 1 capsule by mouth in the morning.   Yes [provider]  nitroGLYCERIN (NITROSTAT) 0.4 MG SL tablet 1 TAB UNDER THE TONGUE EVERY 5 MINUTES AS NEEDED FOR CHEST PAIN UP TO 3 DOSES, IF PERSIST CALL 911 09/10/21  Yes [name], MD  pantoprazole (PROTONIX) 40 MG tablet Take 40 mg by mouth daily as needed (heartburn).   Yes [provider]  Semaglutide,0.25 or 0.'5MG'$ /DOS, (OZEMPIC, 0.25 OR 0.5 MG/DOSE,) 2 MG/1.5ML SOPN Inject 0.5 mg into the skin once a week. 02/25/22  Yes Philemon Kingdom, MD  Testosterone 20.25 MG/1.25GM (1.62%) GEL Place 2 Pump onto the skin daily. Applied to chest   Yes [provider]  traZODone (DESYREL) 100 MG tablet Take 100 mg by mouth at bedtime.   Yes [provider]  Continuous Blood Gluc Sensor (DEXCOM G7 SENSOR) MISC 3 each by Does not apply route every 30 (thirty) days. Apply 1 sensor every 10 days 02/25/22   Philemon Kingdom, MD  [facility] VERIO test strip 1 each daily. 02/23/21   [provider]    Physical Exam: Vitals:   04/10/22 1609 04/10/22 1700 04/10/22 1715 04/10/22 1802  BP: 127/62 130/86 134/77 125/61  Pulse: 71 61 61 72  Resp: 15 15 (!) 23 16  Temp:  98.8 F (37.1 C)  98.8 F (37.1 C)  TempSrc:  Oral    SpO2: 98% 98% 99% 100%  Weight:      Height:       Constitutional: Resting in bed, NAD, calm, comfortable Eyes: EOMI, lids and conjunctivae normal ENMT: Mucous membranes are moist. Posterior pharynx clear of any exudate or lesions.Normal dentition.  Neck: normal, supple, no masses. Respiratory: clear to auscultation bilaterally, no wheezing, no crackles. Normal respiratory effort. No accessory muscle use.  Cardiovascular: Regularly irregular. No extremity edema. 2+ pedal pulses.  LUE aVF with palpable thrill. Abdomen: no tenderness, no masses palpated. Bowel sounds positive.  Musculoskeletal: no clubbing / cyanosis. No joint deformity upper and lower extremities. Good ROM, no contractures. Normal muscle tone.  Skin: no rashes, lesions, ulcers. No induration Neurologic: Sensation intact. Strength 5/5 in all 4.  Psychiatric: Alert and oriented x 3.   EKG: Personally reviewed. Atrial fibrillation, rate 69, RBBB and LAFB.  Assessment/Plan Principal Problem:   Right ureteral stone with hydronephrosis Active Problems:   Acute renal failure superimposed on stage 4 chronic kidney disease (HCC)   Atrial  fibrillation, chronic (HCC)   Diastolic CHF, chronic (HCC)   CAD, AUTOLOGOUS BYPASS GRAFT   Mixed hyperlipidemia   Obstructive sleep apnea of adult   Diabetes mellitus type II, controlled ([place])   [name] is a 76 y.o. male with medical history significant for persistent atrial fibrillation on Eliquis,  chronic HFpEF (EF 55-60% 03/2021), s/p mitral valve repair with history of prosthetic valve endocarditis, CAD s/p CABG, CKD stage IV, history of CVA, T2DM, HTN, HLD, OSA on CPAP who is admitted with right ureteral stone and AKI on CKD stage IV.  Assessment and Plan: * Right ureteral stone with hydronephrosis Presenting with right flank pain due to mid right ureteral stone associated with mild hydronephrosis.  Urology aware, no specific recommendations other than keeping n.p.o. after midnight. -Management per urology -Continue analgesics as needed  Acute renal failure superimposed on stage 4 chronic kidney disease ([place]) Creatinine 5.58 on admit compared to baseline between 3.2-4.0.  Likely worsened in setting of ureteral stone as above.  Has AVF in place. -Continue gentle IV fluids with NS'@100'$  mL overnight -Strict I/O's  Atrial fibrillation, chronic (HCC) Remains in atrial fibrillation with controlled rate. -Continue amiodarone -Continue Eliquis -Scheduled for cardioversion with Dr. Aundra Dubin on 11/17/7122  Diastolic CHF, chronic ([place]) Euvolemic on admission.  On gentle IV fluids as above.  Hold Lasix for now.  CAD, AUTOLOGOUS BYPASS GRAFT Stable without chest pain.  Continue Eliquis and Zetia.  Diabetes mellitus type II, controlled ([place]) Hold Ozempic and placed on SSI.  Obstructive sleep apnea of adult Continue CPAP nightly.  Mixed hyperlipidemia Continue Zetia.  Not on statin due to side effect of myalgias.  DVT prophylaxis:  apixaban (ELIQUIS) tablet 5 mg   Code Status: Full code, confirmed with patient on admission Family Communication: Spouse at bedside Disposition Plan: From home and likely discharge to home pending urology evaluation and recommendations Consults called: Urology Severity of Illness: The appropriate patient status for this patient is OBSERVATION. Observation status is judged to be reasonable and necessary in order to provide the required intensity of service  to ensure the patient's safety. The patient's presenting symptoms, physical exam findings, and initial radiographic and laboratory data in the context of their medical condition is felt to place them at decreased risk for further clinical deterioration. Furthermore, it is anticipated that the patient will be medically stable for discharge from the hospital within 2 midnights of admission.   Zada Finders MD [facility]  If 7PM-7AM, please contact night-coverage [email or web address]  04/10/2022, 8:17 PM

## 2022-04-10 NOTE — ED Notes (Signed)
Pt here via [facility]. Pt denies any pain at this time. A&Ox4, placed on cardiac monitor.

## 2022-04-10 NOTE — Assessment & Plan Note (Signed)
Continue CPAP nightly. °

## 2022-04-10 NOTE — ED Notes (Signed)
Report called to charge RN.

## 2022-04-10 NOTE — Hospital Course (Signed)
[name] is a 76 y.o. male with medical history significant for persistent atrial fibrillation on Eliquis, chronic HFpEF (EF 55-60% 03/2021), s/p mitral valve repair with history of prosthetic valve endocarditis, CAD s/p CABG, CKD stage IV, history of CVA, T2DM, HTN, HLD, OSA on CPAP who is admitted with right ureteral stone and AKI on CKD stage IV.

## 2022-04-10 NOTE — Assessment & Plan Note (Signed)
Euvolemic on admission.  On gentle IV fluids as above.  Hold Lasix for now.

## 2022-04-10 NOTE — ED Provider Notes (Signed)
Accepted handoff at shift change from B Henderli PA-C. Please see prior provider note for more detail.   Briefly: Patient is 77 y.o.  With AKI on CKD and nephrolithiasis.   Plan: Admit     Physical Exam  BP 125/61 (BP Location: Right Arm)   Pulse 72   Temp 98.8 F (37.1 C)   Resp 16   Ht '5\' 9"'$  (1.753 m)   Wt 89.4 kg   SpO2 100%   BMI 29.11 kg/m   Physical Exam Patient overall well-appearing.  In no acute distress.   Procedures  Procedures  ED Course / MDM   Clinical Course as of 04/10/22 1854  Fri Apr 10, 2022  1635 [place] EDMD at [facility]  [BH]    Clinical Course User Index [BH] Nettie Elm, PA-C   Medical Decision Making Amount and/or Complexity of Data Reviewed Labs: ordered. Radiology: ordered.  Risk Prescription drug management. Decision regarding hospitalization.   Patient noted to Dr. Posey Pronto.  I briefly discussed case with Dr. Alyson Ingles who will follow along during hospital stay.  N.p.o. after midnight order was placed by me.       Pati Gallo [place], [place] 04/10/22 1856    Hayden Rasmussen, MD 04/11/22 [id number]

## 2022-04-10 NOTE — ED Notes (Signed)
Provided pt with water.

## 2022-04-10 NOTE — ED Provider Notes (Signed)
[place] EMERGENCY DEPT Provider Note   CSN: [id number] Arrival date & time: 04/10/22  1230     History  Chief Complaint  Patient presents with   Abdominal Pain    [name] is a 76 y.o. male with diabetes, CKD, prior MI, hypertension, Afib on chronic anticoag, CHF, prior prosthetic valve endocarditis here for evaluation of right lower abdominal pain, began Wednesday.  Also some right flank pain.  No fever, nausea, vomiting, diarrhea, chest pain, shortness of breath, dysuria hematuria.  Seen by urgent care sent here for imaging. Prior hx of kidney stones.  Prior lab band  Chronic anticoag for Afib Eliquis, compliant Cardioverson scheduled for Monday with [facility]- Sees Dr. Aundra Dubin    No new CP, OSB, midline back pain, no hx of AAA, dissection  HPI     Home Medications Prior to Admission medications   Medication Sig Start Date End Date Taking? Authorizing Provider  Alirocumab (PRALUENT) 75 MG/ML SOAJ Inject 75 mg into the skin every 14 (fourteen) days.    [provider]  allopurinol (ZYLOPRIM) 100 MG tablet Take 0.5 tablets (50 mg total) by mouth 2 (two) times a week. Please discuss dosing with your PCP based on your renal function. Patient taking differently: Take 50 mg by mouth 2 (two) times a week. Tuesdays & Fridays. 01/13/21 04/08/22  [name], MD  amiodarone (PACERONE) 200 MG tablet Take 0.5 tablets (100 mg total) by mouth daily. Patient taking differently: Take 200 mg by mouth in the morning. 04/08/21   [name], [name], FNP  apixaban (ELIQUIS) 5 MG TABS tablet Take 1 tablet (5 mg total) by mouth 2 (two) times daily. 09/05/21   [name], MD  cetirizine (ZYRTEC) 10 MG tablet Take 10 mg by mouth in the morning.    [provider]  colchicine 0.6 MG tablet Take 0.6-1.2 mg by mouth 2 (two) times daily as needed (gout).  04/14/19   [provider]  Continuous Blood Gluc Sensor (DEXCOM G7 SENSOR) MISC 3 each  by Does not apply route every 30 (thirty) days. Apply 1 sensor every 10 days 02/25/22   Philemon Kingdom, MD  Cyanocobalamin (VITAMIN B-12) 5000 MCG TBDP Take 5,000 mcg by mouth 2 (two) times a week.    [provider]  cycloSPORINE (RESTASIS) 0.05 % ophthalmic emulsion Place 1 drop into both eyes 2 (two) times daily as needed (eye irritation.).    [provider]  diclofenac Sodium (VOLTAREN) 1 % GEL Apply 2 g topically 3 (three) times daily as needed for pain. 07/05/21   [provider]  ezetimibe (ZETIA) 10 MG tablet Take 1 tablet (10 mg total) by mouth daily. 12/26/21   [name], MD  fluticasone [facility]) 50 MCG/ACT nasal spray Place 1 spray into both nostrils daily as needed for allergies or rhinitis.    [provider]  furosemide (LASIX) 40 MG tablet Take 40 mg by mouth See admin instructions. Take 1 tablet (40 mg) by mouth every other day alternating with 80 mg dosage 04/08/22   [provider]  furosemide (LASIX) 80 MG tablet Take 80 mg by mouth See admin instructions. Take 1 tablet (80 mg) by mouth every other day alternating with 40 mg dosage    [provider]  HYDROcodone-acetaminophen (NORCO/VICODIN) 5-325 MG tablet Take 1 tablet by mouth as needed.    [provider]  methocarbamol (ROBAXIN) 500 MG tablet Take 500 mg by mouth 3 (three) times daily  as needed for muscle spasms. 08/20/21   [provider]  Multiple Vitamin (MULTIVITAMIN) capsule Take 1 capsule by mouth in the morning.    [provider]  nitroGLYCERIN (NITROSTAT) 0.4 MG SL tablet 1 TAB UNDER THE TONGUE EVERY 5 MINUTES AS NEEDED FOR CHEST PAIN UP TO 3 DOSES, IF PERSIST CALL 911 09/10/21   [name], MD  [facility] VERIO test strip 1 each daily. 02/23/21   [provider]  pantoprazole (PROTONIX) 40 MG tablet Take 40 mg by mouth daily as needed (heartburn).    [provider]  Semaglutide,0.25 or 0.'5MG'$ /DOS, (OZEMPIC, 0.25  OR 0.5 MG/DOSE,) 2 MG/1.5ML SOPN Inject 0.5 mg into the skin once a week. Patient taking differently: Inject 0.5 mg into the skin every Thursday. 02/25/22   Philemon Kingdom, MD  Testosterone 20.25 MG/1.25GM (1.62%) GEL Place 2 Pump onto the skin daily. Applied to chest    [provider]  traZODone (DESYREL) 100 MG tablet Take 100 mg by mouth at bedtime.    [provider]      Allergies    Crestor [rosuvastatin], Lipitor [atorvastatin], Pravastatin, Carvedilol, Metformin, Serotonin, Serotonin reuptake inhibitors (ssris), Zocor [simvastatin], and Codeine    Review of Systems   Review of Systems  Constitutional: Negative.   HENT: Negative.    Respiratory: Negative.    Cardiovascular: Negative.   Gastrointestinal:  Positive for abdominal pain. Negative for abdominal distention, anal bleeding, blood in stool, constipation, diarrhea, nausea, rectal pain and vomiting.  Genitourinary:  Positive for flank pain. Negative for decreased urine volume, difficulty urinating, dysuria, enuresis, frequency, genital sores, penile discharge, penile swelling, scrotal swelling, testicular pain and urgency.  Skin: Negative.   Neurological: Negative.   All other systems reviewed and are negative.   Physical Exam Updated Vital Signs BP 127/62 (BP Location: Right Wrist)   Pulse 71   Temp 98.8 F (37.1 C) (Oral)   Resp 15   Ht '5\' 9"'$  (1.753 m)   Wt 89.4 kg   SpO2 98%   BMI 29.11 kg/m  Physical Exam Vitals and nursing note reviewed.  Constitutional:      General: He is not in acute distress.    Appearance: He is well-developed. He is not ill-appearing, toxic-appearing or diaphoretic.  HENT:     Head: Atraumatic.  Eyes:     Pupils: Pupils are equal, round, and reactive to light.  Cardiovascular:     Rate and Rhythm: Normal rate and regular rhythm.     Pulses: Normal pulses.          Radial pulses are 2+ on the right side and 2+ on the left side.  Pulmonary:     Effort: Pulmonary  effort is normal. No respiratory distress.     Breath sounds: Normal breath sounds and air entry.  Abdominal:     General: Bowel sounds are normal. There is no distension.     Palpations: Abdomen is soft.     Tenderness: There is abdominal tenderness in the right lower quadrant. There is right CVA tenderness. There is no guarding. Negative signs include Murphy's sign and McBurney's sign.  Musculoskeletal:        General: Normal range of motion.     Cervical back: Normal range of motion and neck supple.  Skin:    General: Skin is warm and dry.  Neurological:     General: No focal deficit present.     Mental Status: He is alert and oriented to person, place, and  time.     ED Results / Procedures / Treatments   Labs (all labs ordered are listed, but only abnormal results are displayed) Labs Reviewed  COMPREHENSIVE METABOLIC PANEL - Abnormal; Notable for the following components:      Result Value   Sodium 133 (*)    CO2 21 (*)    Glucose, Bld 141 (*)    BUN 59 (*)    Creatinine, Ser 5.58 (*)    GFR, Estimated 10 (*)    All other components within normal limits  CBC - Abnormal; Notable for the following components:   WBC 12.5 (*)    RBC 4.05 (*)    HCT 38.5 (*)    All other components within normal limits  URINALYSIS, ROUTINE W REFLEX MICROSCOPIC - Abnormal; Notable for the following components:   Hgb urine dipstick MODERATE (*)    Protein, ur 30 (*)    All other components within normal limits  LIPASE, BLOOD    EKG None  Radiology CT ABDOMEN PELVIS WO CONTRAST  Result Date: 04/10/2022 CLINICAL DATA:  RLQ abdominal pain (Age >= 14y) rlq abd pain, acute renal failure EXAM: CT ABDOMEN AND PELVIS WITHOUT CONTRAST TECHNIQUE: Multidetector CT imaging of the abdomen and pelvis was performed following the standard protocol without IV contrast. RADIATION DOSE REDUCTION: This exam was performed according to the departmental dose-optimization program which includes automated  exposure control, adjustment of the mA and/or kV according to patient size and/or use of iterative reconstruction technique. COMPARISON:  01/07/2021 FINDINGS: Lower chest: No acute abnormality. Coronary artery and aortic calcifications. Calcified granuloma in the right lower lung. Hepatobiliary: Small layering gallstones in the gallbladder. No biliary ductal dilatation or focal hepatic abnormality. Pancreas: No focal abnormality or ductal dilatation. Spleen: No focal abnormality.  Normal size. Adrenals/Urinary Tract: 3 mm right midpole renal stone. Mild right hydronephrosis due to 2 8 mm mid right ureteral stone. No hydronephrosis or stones on the left. Adrenal glands and urinary bladder unremarkable. Stomach/Bowel: Colonic diverticulosis. No active diverticulitis. Normal appendix. Stomach and small bowel unremarkable. Lap band device in place, grossly unremarkable. Vascular/Lymphatic: Aortic atherosclerosis. No evidence of aneurysm or adenopathy. Reproductive: No visible focal abnormality. Other: No free fluid or free air. Musculoskeletal: No acute bony abnormality. IMPRESSION: 8 mm mid right ureteral stone with mild right hydronephrosis. Right nephrolithiasis. Cholelithiasis. Aortic atherosclerosis. Colonic diverticulosis. Electronically Signed   By: Rolm Baptise M.D.   On: 04/10/2022 15:03    Procedures Procedures    Medications Ordered in ED Medications  sodium chloride 0.9 % bolus 500 mL (0 mLs Intravenous Stopped 04/10/22 1602)  fentaNYL (SUBLIMAZE) injection 50 mcg (50 mcg Intravenous Given 04/10/22 1429)  HYDROmorphone (DILAUDID) injection 0.5 mg (0.5 mg Intravenous Given 04/10/22 1602)    ED Course/ Medical Decision Making/ A&P Clinical Course as of 04/10/22 1714  Fri Apr 10, 2022  1635 [place] EDMD at [facility]  [BH]    Clinical Course User Index [BH] Caresse Sedivy A, PA-C    76 year old multiple medical problems chronically anticoagulated for A-fib here for evaluation of right lower  quadrant pain.  Was sent by urgent care for right lower quadrant pain, thought possible appendicitis.  On my exam he has right flank pain and right lower quadrant pain.  Does have history of stones.  I think his pain is likely related to a kidney stone.  Has some blood in his urine however denies any gross hematuria.  No dysuria.  We will plan on labs and imaging does have  history of CHF however last EF 55 to 60% in 2022  Labs and imaging personally viewed and interpreted:  UA Negative for infection, positive for blood CBC leukocytosis 82.5 Metabolic panel sodium 003, BUN 59, creatinine 5.58>Creatinine- 3.5 at PCP 11/2021, 3.9 yesterday Baseline around 3.5 from [place] PCP note EKG with Afib rate controlled CT AP 8 mm right ureteral stone with hydronephrosis  Patient needing additional pain medications I discussed labs and imaging with patient, wife in room.  I recommended admission for his acute kidney injury.  Will discuss with urology given size of stone as well.  CONSULT with Dr. Alyson Ingles with Urology.  Recommend ED to ED transfer so he can assess patient.  Hopefully with some IV fluids his kidney function can improve.  Patient will ultimately need medicine admission for acute renal failure monitoring and pain management.  Discussed with Dr. Marshia Ly long ED provider. Agrees to except patient in transfer.  Plan on Urology to see in ED and medicine will need to be consulted for admission [facility] in [facility].  Family agreeable with plan for admission and Urology consult                           Medical Decision Making Amount and/or Complexity of Data Reviewed Independent Historian: spouse External Data Reviewed: labs, radiology, ECG and notes. Labs: ordered. Decision-making details documented in ED Course. Radiology: ordered and independent interpretation performed. Decision-making details documented in ED Course. ECG/medicine tests: ordered and independent interpretation performed.  Decision-making details documented in ED Course.  Risk OTC drugs. Prescription drug management. Parenteral controlled substances. Decision regarding hospitalization. Diagnosis or treatment significantly limited by social determinants of health.           Final Clinical Impression(s) / ED Diagnoses Final diagnoses:  Ureteral stone with hydronephrosis  AKI (acute kidney injury) ([place])  Longstanding persistent atrial fibrillation (H[facility])  Chronic anticoagulation    Rx / DC Orders ED Discharge Orders     None         Ulisses Vondrak A, PA-C 04/10/22 1714    Gareth Morgan, MD 04/10/22 2256

## 2022-04-10 NOTE — ED Notes (Signed)
Urology paged, MD returned call, RN transferred to [place]

## 2022-04-11 ENCOUNTER — Inpatient Hospital Stay (HOSPITAL_COMMUNITY): Payer: Medicare Other

## 2022-04-11 ENCOUNTER — Encounter (HOSPITAL_COMMUNITY)
Admission: EM | Disposition: A | Discharge status: Home / Self Care | Payer: Self-pay | Source: Home / Self Care | Attending: Internal Medicine

## 2022-04-11 ENCOUNTER — Inpatient Hospital Stay (HOSPITAL_COMMUNITY): Payer: Medicare Other | Admitting: Anesthesiology

## 2022-04-11 DIAGNOSIS — N201 Calculus of ureter: Secondary | ICD-10-CM

## 2022-04-11 DIAGNOSIS — I5032 Chronic diastolic (congestive) heart failure: Secondary | ICD-10-CM | POA: Diagnosis not present

## 2022-04-11 DIAGNOSIS — N179 Acute kidney failure, unspecified: Secondary | ICD-10-CM | POA: Diagnosis not present

## 2022-04-11 DIAGNOSIS — I482 Chronic atrial fibrillation, unspecified: Secondary | ICD-10-CM | POA: Diagnosis not present

## 2022-04-11 DIAGNOSIS — I110 Hypertensive heart disease with heart failure: Secondary | ICD-10-CM

## 2022-04-11 DIAGNOSIS — N132 Hydronephrosis with renal and ureteral calculous obstruction: Secondary | ICD-10-CM | POA: Diagnosis not present

## 2022-04-11 DIAGNOSIS — I4891 Unspecified atrial fibrillation: Secondary | ICD-10-CM

## 2022-04-11 DIAGNOSIS — I2510 Atherosclerotic heart disease of native coronary artery without angina pectoris: Secondary | ICD-10-CM

## 2022-04-11 HISTORY — PX: CYSTOSCOPY W/ URETERAL STENT PLACEMENT: SHX1429

## 2022-04-11 LAB — BASIC METABOLIC PANEL
Anion gap: 8 (ref 5–15)
BUN: 59 mg/dL — ABNORMAL HIGH (ref 8–23)
CO2: 20 mmol/L — ABNORMAL LOW (ref 22–32)
Calcium: 9.1 mg/dL (ref 8.9–10.3)
Chloride: 105 mmol/L (ref 98–111)
Creatinine, Ser: 5.47 mg/dL — ABNORMAL HIGH (ref 0.61–1.24)
GFR, Estimated: 10 mL/min — ABNORMAL LOW (ref >60)
Glucose, Bld: 116 mg/dL — ABNORMAL HIGH (ref 70–99)
Potassium: 3.9 mmol/L (ref 3.5–5.1)
Sodium: 133 mmol/L — ABNORMAL LOW (ref 135–145)

## 2022-04-11 LAB — CBC
HCT: 34.7 % — ABNORMAL LOW (ref 39.0–52.0)
Hemoglobin: 11.4 g/dL — ABNORMAL LOW (ref 13.0–17.0)
MCH: 32.8 pg (ref 26.0–34.0)
MCHC: 32.9 g/dL (ref 30.0–36.0)
MCV: 99.7 fL (ref 80.0–100.0)
Platelets: 123 10*3/uL — ABNORMAL LOW (ref 150–400)
RBC: 3.48 MIL/uL — ABNORMAL LOW (ref 4.22–5.81)
RDW: 12.8 % (ref 11.5–15.5)
WBC: 9.6 10*3/uL (ref 4.0–10.5)
nRBC: 0 % (ref 0.0–0.2)

## 2022-04-11 LAB — CBG MONITORING, ED: Glucose-Capillary: 132 mg/dL — ABNORMAL HIGH (ref 70–99)

## 2022-04-11 LAB — GLUCOSE, CAPILLARY
Glucose-Capillary: 112 mg/dL — ABNORMAL HIGH (ref 70–99)
Glucose-Capillary: 156 mg/dL — ABNORMAL HIGH (ref 70–99)
Glucose-Capillary: 255 mg/dL — ABNORMAL HIGH (ref 70–99)

## 2022-04-11 SURGERY — CYSTOSCOPY, WITH RETROGRADE PYELOGRAM AND URETERAL STENT INSERTION
Anesthesia: General | Site: Ureter | Laterality: Right

## 2022-04-11 MED ORDER — PROPOFOL 10 MG/ML IV BOLUS
INTRAVENOUS | Status: DC | PRN
  Administered 2022-04-11: 180 mg via INTRAVENOUS

## 2022-04-11 MED ORDER — STERILE WATER FOR IRRIGATION IR SOLN
Status: DC | PRN
  Administered 2022-04-11: 3000 mL

## 2022-04-11 MED ORDER — FENTANYL CITRATE (PF) 100 MCG/2ML IJ SOLN
INTRAMUSCULAR | Status: AC
  Filled 2022-04-11: qty 2

## 2022-04-11 MED ORDER — FENTANYL CITRATE (PF) 100 MCG/2ML IJ SOLN
INTRAMUSCULAR | Status: DC | PRN
Start: 2022-04-11 — End: 2022-04-11
  Administered 2022-04-11: 50 ug via INTRAVENOUS

## 2022-04-11 MED ORDER — IOHEXOL 300 MG/ML  SOLN
INTRAMUSCULAR | Status: DC | PRN
  Administered 2022-04-11: 14 mL

## 2022-04-11 MED ORDER — DEXAMETHASONE SODIUM PHOSPHATE 10 MG/ML IJ SOLN
INTRAMUSCULAR | Status: AC
  Filled 2022-04-11: qty 1

## 2022-04-11 MED ORDER — LACTATED RINGERS IV SOLN: INTRAVENOUS | Status: DC | PRN

## 2022-04-11 MED ORDER — ONDANSETRON HCL 4 MG/2ML IJ SOLN
INTRAMUSCULAR | Status: DC | PRN
  Administered 2022-04-11: 4 mg via INTRAVENOUS

## 2022-04-11 MED ORDER — EPHEDRINE SULFATE-NACL 50-0.9 MG/10ML-% IV SOSY
PREFILLED_SYRINGE | INTRAVENOUS | Status: DC | PRN
  Administered 2022-04-11: 5 mg via INTRAVENOUS

## 2022-04-11 MED ORDER — LIDOCAINE 2% (20 MG/ML) 5 ML SYRINGE
INTRAMUSCULAR | Status: DC | PRN
  Administered 2022-04-11: 60 mg via INTRAVENOUS

## 2022-04-11 MED ORDER — ACETAMINOPHEN 10 MG/ML IV SOLN
1000.0000 mg | Freq: Once | INTRAVENOUS | Status: DC | PRN
Start: 2022-04-11 — End: 2022-04-11

## 2022-04-11 MED ORDER — PROPOFOL 10 MG/ML IV BOLUS
INTRAVENOUS | Status: AC
  Filled 2022-04-11: qty 20

## 2022-04-11 MED ORDER — ONDANSETRON HCL 4 MG/2ML IJ SOLN
INTRAMUSCULAR | Status: AC
  Filled 2022-04-11: qty 2

## 2022-04-11 MED ORDER — FENTANYL CITRATE PF 50 MCG/ML IJ SOSY: 25.0000 ug | PREFILLED_SYRINGE | INTRAMUSCULAR | Status: DC | PRN

## 2022-04-11 MED ORDER — DEXAMETHASONE SODIUM PHOSPHATE 10 MG/ML IJ SOLN
INTRAMUSCULAR | Status: DC | PRN
  Administered 2022-04-11: 8 mg via INTRAVENOUS

## 2022-04-11 MED ORDER — CEFAZOLIN SODIUM-DEXTROSE 2-4 GM/100ML-% IV SOLN
2.0000 g | Freq: Once | INTRAVENOUS | Status: AC
Start: 2022-04-11 — End: 2022-04-11
  Administered 2022-04-11: 2 g via INTRAVENOUS
  Filled 2022-04-11: qty 100

## 2022-04-11 MED ORDER — LIDOCAINE 2% (20 MG/ML) 5 ML SYRINGE
INTRAMUSCULAR | Status: AC
  Filled 2022-04-11: qty 5

## 2022-04-11 SURGICAL SUPPLY — 12 items
BAG URO CATCHER STRL LF (MISCELLANEOUS) ×1 IMPLANT
CATH URETL OPEN END 6FR 70 (CATHETERS) ×1 IMPLANT
CLOTH BEACON ORANGE TIMEOUT ST (SAFETY) ×1 IMPLANT
GLOVE SURG LX 8.0 MICRO (GLOVE) ×1
GLOVE SURG LX STRL 8.0 MICRO (GLOVE) ×1 IMPLANT
GOWN STRL REUS W/ TWL XL LVL3 (GOWN DISPOSABLE) ×2 IMPLANT
GOWN STRL REUS W/TWL XL LVL3 (GOWN DISPOSABLE) ×2
GUIDEWIRE STR DUAL SENSOR (WIRE) ×1 IMPLANT
MANIFOLD NEPTUNE II (INSTRUMENTS) ×1 IMPLANT
PACK CYSTO (CUSTOM PROCEDURE TRAY) ×1 IMPLANT
STENT URET 6FRX26 CONTOUR (STENTS) IMPLANT
TUBING CONNECTING 10 (TUBING) ×1 IMPLANT

## 2022-04-11 NOTE — Op Note (Signed)
.  Preoperative diagnosis: right proximal ureteral stone, acute renal failure  Postoperative diagnosis: Same  Procedure: 1 cystoscopy 2. right retrograde pyelography 3.  Intraoperative fluoroscopy, under one hour, with interpretation 4. right 6 x 26 JJ stent placement  Attending: Nicolette Bang  Anesthesia: General  Estimated blood loss: None  Drains: Right 6 x 26 JJ ureteral stent without tether  Specimens: none  Antibiotics: ancef  Findings: right proximal ureteral stone. Moderate hydronephrosis. No masses/lesions in the bladder. Ureteral orifices in normal anatomic location.  Indications: Patient is a 76 year old male with a history of right ureteral stone and acute renal failure.  After discussing treatment options, they decided proceed with right stent placement.  Procedure in detail: The patient was brought to the operating room and a brief timeout was done to ensure correct patient, correct procedure, correct site.  General anesthesia was administered patient was placed in dorsal lithotomy position.  Their genitalia was then prepped and draped in usual sterile fashion.  A rigid 54 French cystoscope was passed in the urethra and the bladder.  Bladder was inspected free masses or lesions.  the ureteral orifices were in the normal orthotopic locations.  a 6 french ureteral catheter was then instilled into the right ureteral orifice.  a gentle retrograde was obtained and findings noted above.  we then placed a zip wire through the ureteral catheter and advanced up to the renal pelvis.    We then placed a 6 x 26 double-j ureteral stent over the original zip wire.  We then removed the wire and good coil was noted in the the renal pelvis under fluoroscopy and the bladder under direct vision.  the bladder was then drained and this concluded the procedure which was well tolerated by patient.  Complications: None  Condition: Stable, extubated, transferred to PACU  Plan: Patient is to be  admitted to hospitalist service. He will have his stone extraction in 2 weeks.

## 2022-04-11 NOTE — ED Notes (Signed)
[facility] ED - Dr. Francia Greaves accepting

## 2022-04-11 NOTE — Progress Notes (Signed)
Bloody urine still noted. Jonny Ruiz, NP notified, said to hold Eliquis for tonight. Will cotinue to monitor pt.

## 2022-04-11 NOTE — Anesthesia Preprocedure Evaluation (Addendum)
Anesthesia Evaluation  Patient identified by MRN, date of birth, ID band Patient awake    Reviewed: Allergy & Precautions, NPO status , Patient's Chart, lab work & pertinent test results  Airway Mallampati: II  TM Distance: >3 FB Neck ROM: Full    Dental no notable dental hx.    Pulmonary sleep apnea and Continuous Positive Airway Pressure Ventilation ,    Pulmonary exam normal        Cardiovascular hypertension, + CAD, + Past MI, + Cardiac Stents and +CHF  CABG: 1995.  + dysrhythmias Atrial Fibrillation + Valvular Problems/Murmurs (s/p MVR ring 2017) MR  Rhythm:Irregular Rate:Normal     Neuro/Psych Depression CVA    GI/Hepatic Neg liver ROS, PUD, GERD  Medicated,  Endo/Other  diabetes, Type 2, Insulin Dependent, Oral Hypoglycemic Agents  Renal/GU Renal diseaseRight ureteral stone  negative genitourinary   Musculoskeletal  (+) Arthritis , Osteoarthritis,    Abdominal Normal abdominal exam  (+)   Peds  Hematology  (+) Blood dyscrasia, anemia ,   Anesthesia Other Findings   Reproductive/Obstetrics                            Anesthesia Physical Anesthesia Plan  ASA: 3  Anesthesia Plan: General   Post-op Pain Management:    Induction: Intravenous  PONV Risk Score and Plan: 2 and Ondansetron, Dexamethasone and Treatment may vary due to age or medical condition  Airway Management Planned: Mask and LMA  Additional Equipment: None  Intra-op Plan:   Post-operative Plan: Extubation in OR  Informed Consent: I have reviewed the patients History and Physical, chart, labs and discussed the procedure including the risks, benefits and alternatives for the proposed anesthesia with the patient or authorized representative who has indicated his/her understanding and acceptance.     Dental advisory given  Plan Discussed with: CRNA  Anesthesia Plan Comments: (Lab Results      Component                 Value               Date                      WBC                      9.6                 04/11/2022                HGB                      11.4 (L)            04/11/2022                HCT                      34.7 (L)            04/11/2022                MCV                      99.7                04/11/2022  PLT                      123 (L)             04/11/2022           Lab Results      Component                Value               Date                      NA                       133 (L)             04/11/2022                K                        3.9                 04/11/2022                CO2                      20 (L)              04/11/2022                GLUCOSE                  116 (H)             04/11/2022                BUN                      59 (H)              04/11/2022                CREATININE               5.47 (H)            04/11/2022                CALCIUM                  9.1                 04/11/2022                GFRNONAA                 10 (L)              04/11/2022            ECHO 2022: 1. Left ventricular ejection fraction, by estimation, is 55 to 60%. The  left ventricle has normal function. The left ventricle has no regional  wall motion abnormalities. There is moderate asymmetric left ventricular  hypertrophy of the basal-septal  segment. Left ventricular diastolic parameters are indeterminate.  2. Right ventricular systolic function is normal. The right ventricular  size is normal. There is normal pulmonary artery systolic pressure.  3. Left atrial size was severely dilated.  4. Mean mitral valve gradient is 8 mmHg compared with  12 mmHg 02/2021.  There is a filamentous, mobile density on the left atrial side of the  mitral annulus. Previously noted on TEE 02/2021. No significant change.  Likely an annuloplasty suture. The mitral  valve has been repaired/replaced. Mild mitral valve regurgitation. Mild to  moderate  mitral stenosis. There is a 26 mm prosthetic annuloplasty ring  present in the mitral position. Procedure Date: 03/25/2016.  5. The aortic valve is tricuspid. There is mild calcification of the  aortic valve. There is mild thickening of the aortic valve. Aortic valve  regurgitation is not visualized. No aortic stenosis is present.  6. Aortic dilatation noted. There is mild dilatation of the ascending  aorta, measuring 39 mm.  7. The inferior vena cava is normal in size with greater than 50%  respiratory variability, suggesting right atrial pressure of 3 mmHg.  8. Evidence of atrial level shunting detected by color flow Doppler.  There is a large patent foramen ovale with predominantly left to right  shunting across the atrial septum. )      Anesthesia Quick Evaluation

## 2022-04-11 NOTE — Anesthesia Procedure Notes (Signed)
Procedure Name: LMA Insertion Date/Time: 04/11/2022 12:04 PM  Performed by: Victoriano Lain, CRNAPre-anesthesia Checklist: Patient identified, Emergency Drugs available, Suction available, Patient being monitored and Timeout performed Patient Re-evaluated:Patient Re-evaluated prior to induction Oxygen Delivery Method: Circle system utilized Preoxygenation: Pre-oxygenation with 100% oxygen Induction Type: IV induction LMA: LMA with gastric port inserted LMA Size: 4.0 Number of attempts: 1 Placement Confirmation: positive ETCO2 and breath sounds checked- equal and bilateral Tube secured with: Tape Dental Injury: Teeth and Oropharynx as per pre-operative assessment

## 2022-04-11 NOTE — Plan of Care (Signed)
  Problem: Education: Goal: Knowledge of General Education information will improve Description Including pain rating scale, medication(s)/side effects and non-pharmacologic comfort measures Outcome: Progressing   

## 2022-04-11 NOTE — Progress Notes (Signed)
PROGRESS NOTE    [name]  MPN:[id number] DOB: Mar 22, 1946 DOA: 04/10/2022 PCP: [name], MD    Brief Narrative:  76 year old male with a history of chronic kidney disease stage IV, diabetes, chronic atrial fibrillation anticoagulation, diastolic heart failure, presents to the emergency room with right flank pain.  Found to have obstructing right ureteral stone.  He has also had acute kidney injury.  Seen by urology and underwent ureteral stent placement.   Assessment & Plan:   Principal Problem:   Right ureteral stone with hydronephrosis Active Problems:   Acute renal failure superimposed on stage 4 chronic kidney disease (HCC)   Atrial fibrillation, chronic (HCC)   Diastolic CHF, chronic (HCC)   CAD, AUTOLOGOUS BYPASS GRAFT   Mixed hyperlipidemia   Obstructive sleep apnea of adult   Diabetes mellitus type II, controlled ([place])   AKI on CKD stage IV -Baseline creatinine appears to be around 4.0 -He was admitted with creatinine of 5.5 -This was likely secondary to obstructing stone -He is status post ureteral stent placement -Continue to follow renal function  Right ureteral stone with hydronephrosis -Seen by urology -Status post right ureteral stent placement -Plans are for stone extraction in 2 weeks  Chronic diastolic congestive heart failure -Currently appears to be euvolemic -Holding Lasix for now  Chronic atrial fibrillation -Continued on amiodarone -Continued on Eliquis, this was not held on admission -Reportedly scheduled for cardioversion with Dr. Aundra Dubin on 8/21.  We will try and send message to cardiology service so they are aware.  Suspect that cardioversion may need to be postponed in the setting of acute illness  Diabetes, type II -Ozempic on hold on admission -Continue on sliding scale insulin  Obstructive sleep apnea -Continue on CPAP   DVT prophylaxis: apixaban (ELIQUIS) tablet 5 mg Start: 04/11/22 2200 Place and maintain sequential  compression device Start: 04/11/22 0919 apixaban (ELIQUIS) tablet 5 mg  Code Status: Full code Family Communication: Updated wife at the bedside Disposition Plan: Status is: Inpatient Remains inpatient appropriate because: Continued monitoring of renal function     Consultants:  Urology  Procedures:  Right ureteral stent placement  Antimicrobials:      Subjective: Feels that abdominal pain is significantly improved since admission and ureteral stent placement  Objective: Vitals:   04/11/22 1551 04/11/22 1702 04/11/22 2037 04/11/22 2058  BP: 126/69 (!) 140/66 115/76   Pulse: 65 72 77   Resp: '18  20 18  '$ Temp: 97.9 F (36.6 C) 98 F (36.7 C)    TempSrc: Oral Oral    SpO2: 100% 98% 96%   Weight:      Height:        Intake/Output Summary (Last 24 hours) at 04/11/2022 2130 Last data filed at 04/11/2022 2000 Gross per 24 hour  Intake 2178.44 ml  Output 625 ml  Net 1553.44 ml   Filed Weights   04/10/22 1236  Weight: 89.4 kg    Examination:  General exam: Appears calm and comfortable  Respiratory system: Clear to auscultation. Respiratory effort normal. Cardiovascular system: S1 & S2 heard, irregular. No JVD, murmurs, rubs, gallops or clicks. No pedal edema. Gastrointestinal system: Abdomen is nondistended, soft and nontender. No organomegaly or masses felt. Normal bowel sounds heard. Central nervous system: Alert and oriented. No focal neurological deficits. Extremities: Symmetric 5 x 5 power. Skin: No rashes, lesions or ulcers Psychiatry: Judgement and insight appear normal. Mood & affect appropriate.     Data Reviewed: I have personally reviewed following labs and imaging  studies  CBC: Recent Labs  Lab 04/08/22 1430 04/10/22 1240 04/11/22 0430  WBC 6.4 12.5* 9.6  HGB 12.9* 13.0 11.4*  HCT 37.7* 38.5* 34.7*  MCV 95.0 95.1 99.7  PLT 148* 155 725*   Basic Metabolic Panel: Recent Labs  Lab 04/08/22 1430 04/10/22 1240 04/11/22 0430  NA 138 133*  133*  K 4.0 4.3 3.9  CL 103 99 105  CO2 26 21* 20*  GLUCOSE 213* 141* 116*  BUN 47* 59* 59*  CREATININE 3.90* 5.58* 5.47*  CALCIUM 10.3 10.0 9.1   GFR: Estimated Creatinine Clearance: 12.7 mL/min (A) (by C-G formula based on SCr of 5.47 mg/dL (H)). Liver Function Tests: Recent Labs  Lab 04/08/22 1430 04/10/22 1240  AST 16 15  ALT 13 12  ALKPHOS 109 109  BILITOT 0.6 0.8  PROT 6.4* 7.2  ALBUMIN 4.0 4.6   Recent Labs  Lab 04/10/22 1240  LIPASE 42   No results for input(s): "AMMONIA" in the last 168 hours. Coagulation Profile: No results for input(s): "INR", "PROTIME" in the last 168 hours. Cardiac Enzymes: No results for input(s): "CKTOTAL", "CKMB", "CKMBINDEX", "TROPONINI" in the last 168 hours. BNP (last 3 results) No results for input(s): "PROBNP" in the last 8760 hours. HbA1C: No results for input(s): "HGBA1C" in the last 72 hours. CBG: Recent Labs  Lab 04/10/22 2216 04/11/22 0840 04/11/22 1234 04/11/22 1628 04/11/22 2039  GLUCAP 117* 132* 112* 156* 255*   Lipid Profile: No results for input(s): "CHOL", "HDL", "LDLCALC", "TRIG", "CHOLHDL", "LDLDIRECT" in the last 72 hours. Thyroid Function Tests: No results for input(s): "TSH", "T4TOTAL", "FREET4", "T3FREE", "THYROIDAB" in the last 72 hours. Anemia Panel: No results for input(s): "VITAMINB12", "FOLATE", "FERRITIN", "TIBC", "IRON", "RETICCTPCT" in the last 72 hours. Sepsis Labs: No results for input(s): "PROCALCITON", "LATICACIDVEN" in the last 168 hours.  No results found for this or any previous visit (from the past 240 hour(s)).       Radiology Studies: DG C-Arm 1-60 Min-No Report  Result Date: 04/11/2022 Fluoroscopy was utilized by the requesting physician.  No radiographic interpretation.   CT ABDOMEN PELVIS WO CONTRAST  Result Date: 04/10/2022 CLINICAL DATA:  RLQ abdominal pain (Age >= 14y) rlq abd pain, acute renal failure EXAM: CT ABDOMEN AND PELVIS WITHOUT CONTRAST TECHNIQUE: Multidetector  CT imaging of the abdomen and pelvis was performed following the standard protocol without IV contrast. RADIATION DOSE REDUCTION: This exam was performed according to the departmental dose-optimization program which includes automated exposure control, adjustment of the mA and/or kV according to patient size and/or use of iterative reconstruction technique. COMPARISON:  01/07/2021 FINDINGS: Lower chest: No acute abnormality. Coronary artery and aortic calcifications. Calcified granuloma in the right lower lung. Hepatobiliary: Small layering gallstones in the gallbladder. No biliary ductal dilatation or focal hepatic abnormality. Pancreas: No focal abnormality or ductal dilatation. Spleen: No focal abnormality.  Normal size. Adrenals/Urinary Tract: 3 mm right midpole renal stone. Mild right hydronephrosis due to 2 8 mm mid right ureteral stone. No hydronephrosis or stones on the left. Adrenal glands and urinary bladder unremarkable. Stomach/Bowel: Colonic diverticulosis. No active diverticulitis. Normal appendix. Stomach and small bowel unremarkable. Lap band device in place, grossly unremarkable. Vascular/Lymphatic: Aortic atherosclerosis. No evidence of aneurysm or adenopathy. Reproductive: No visible focal abnormality. Other: No free fluid or free air. Musculoskeletal: No acute bony abnormality. IMPRESSION: 8 mm mid right ureteral stone with mild right hydronephrosis. Right nephrolithiasis. Cholelithiasis. Aortic atherosclerosis. Colonic diverticulosis. Electronically Signed   By: Rolm Baptise M.D.   On:  04/10/2022 15:03        Scheduled Meds:  amiodarone  200 mg Oral Daily   apixaban  5 mg Oral BID   ezetimibe  10 mg Oral Daily   insulin aspart  0-9 Units Subcutaneous TID WC   sodium chloride flush  3 mL Intravenous Q12H   traZODone  100 mg Oral QHS   Continuous Infusions:   LOS: 1 day    Time spent: 1mns    JKathie Dike MD [facility]   If 7PM-7AM, please contact  night-coverage [email or web address]  04/11/2022, 9:30 PM

## 2022-04-11 NOTE — Consult Note (Signed)
Urology Consult  Referring physician: Dr. [name] Reason for referral: Right ureteral calculus, acute renal failure  Chief Complaint: right flank pain  History of Present Illness: [name] is a 76yo with a history of nephrolithiasis and CKD who presented to the ER with a 3 day history of right flank pain. The pain was sharp, intermittent, moderate to severe and nonradiating. He had associated nausea but no vomiting. He has had decreased urine output for the past 2 days. Creatinine on presentation was 5.4 up from a baseline of 3-3.2. CT in the ER shows a 7-7m right proximal ureteral calculus with moderate hydronephrosis. His flank pain has significantly improved with narcotics. With hydration his creatinine has not improved.   Past Medical History:  Diagnosis Date   Allergic rhinitis    Anxiety    Atrial fibrillation (HCC)    Cancer (HCC)    hx of skin cancers    Chronic kidney disease    Coronary artery disease    a. s/p MI & CABG; b. s/p PCI Diag;  c. Cath 12/2011: LAD diff dzs/small, Diag 75% isr, 90 dist to stent (small), LCX & RCA occluded, VG->OM 40, VG->PDA patent - med rx.   COVID-19    x 3   Depression    PTSD   Diabetes mellitus    not on medications   Diastolic CHF, chronic ([place]    a. EF 55-60% by echo 2007   GERD (gastroesophageal reflux disease)    "not anymore" " I had a lap band"   History of diverticulitis of colon    5 YRS AGO   History of pneumonia    2017   History of transient ischemic attack (TIA)    CAROTID DOPPLERS NOV 2011  0-39& BIL. 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   Complex valvuloplasty including artificial  Gore-tex neochord placement x4, plication of anterior commissure, and 26 mm Sorin Memo 3D ring annuloplasty via right mini thoracotomy approach   Shortness of breath dyspnea    with exertion   Skin cancer    Sleep apnea    cpap- see ov note in EPIC 05/12/11 for settings    Stroke ([facility]    hx of   Stroke ([facility]    Past Surgical History:  Procedure Laterality Date   AV FISTULA PLACEMENT Left 08/02/2018   Procedure: ARTERIOVENOUS (AV) FISTULA CREATION RADIOCEPHALIC;  Surgeon: DAngelia Mould MD;  [place]  Service: Vascular;  Laterality: Left;   BLEPHAROPLASTY  [place] 2001, 2004, 2009, 2011   CARDIAC CATHETERIZATION N/A 01/23/2016   Procedure: Right/Left Heart Cath and Coronary Angiography;  Surgeon: DLarey Dresser MD;  [facility];  Service: Cardiovascular;  Laterality: N/A;   CARDIOVERSION N/A 09/07/2017   Procedure: CARDIOVERSION;  Surgeon: MLarey Dresser MD;  [facility]  Service: Cardiovascular;  Laterality: N/A;   CARDIOVERSION N/A 09/13/2019   Procedure: CARDIOVERSION;  Surgeon: MLarey Dresser MD;  [facility]  Service: Cardiovascular;  Laterality: N/A;   CARDIOVERSION N/A 10/19/2019  Procedure: CARDIOVERSION;  Surgeon: [name], MD;  Location[facility] END[facility]COPY;  Service: Cardiovascular;  Laterality: N/A;   CATARACT EXTRACTION W/ INTRAOCULAR LENS IMPLANT Bilateral    CHONDROPLASTY  07/22/2011   Procedure: CHONDROPLASTY;  Surgeon: Dione Plover Aluisio;  Location: [place];  Service: Orthopedics;;   CIRCUMCISION  [place]-- POST CABG   W/ STENT, last cath 01/07/2012    CORONARY ARTERY BYPASS GRAFT  1995   X3 VESSEL   CORONARY ARTERY BYPASS GRAFT  1995   CORONARY STENT PLACEMENT     CYSTOSCOPY/URETEROSCOPY/HOLMIUM LASER/STENT PLACEMENT Right 01/08/2021   Procedure: CYSTOSCOPY/RETROGRADE/URETEROSCOPY/HOLMIUM LASER/STENT  PLACEMENT;  Surgeon: Franchot Gallo, MD;  [facility];  Service: Urology;  Laterality: Right;   INCISION AND DRAINAGE PERIRECTAL ABSCESS N/A 09/01/2021   Procedure: IRRIGATION AND DEBRIDEMENT PERIRECTAL ABSCESS;  Surgeon: Kieth Brightly Arta Bruce, MD;  [facility];  Service: General;  Laterality: N/A;   IR PERC TUN PERIT CATH WO PORT S&I [place]  03/06/2021   IR REMOVAL TUN CV CATH W/O FL  04/14/2021   KNEE ARTHROSCOPY  07/22/2011   Procedure: ARTHROSCOPY KNEE;  Surgeon: [name];  Location: [place];  Service: Orthopedics;  Laterality: Right;  WITH DEBRIDEMENT    KNEE ARTHROSCOPY W/ MENISCECTOMY  X2 IN 2002-- RIGHT KNEE   LAPAROSCOPIC GASTRIC BANDING  01-15-09   LEFT HEART CATH AND CORONARY ANGIOGRAPHY N/A 04/29/2017   Procedure: LEFT HEART CATH AND CORONARY ANGIOGRAPHY;  Surgeon: [name], MD;  Location: [place] CV LAB;  Service: Cardiovascular;  Laterality: N/A;   LEFT HEART CATHETERIZATION WITH CORONARY ANGIOGRAM N/A 09/11/2013   Procedure: LEFT HEART CATHETERIZATION WITH CORONARY ANGIOGRAM;  Surgeon: [name], MD;  Location: [facility] CATH LAB;  Service: Cardiovascular;  Laterality: N/A;   MITRAL VALVE REPAIR Right 03/25/2016   Procedure: MINIMALLY INVASIVE REOPERATION FOR MITRAL VALVE REPAIR  (MVR) with size 26 Sorin Memo 3D;  Surgeon: [name], MD;  Location: [place];  Service: Open Heart Surgery;  Laterality: Right;   MITRAL VALVE REPAIR  03/2016   RIGHT KNEE ED COMPARTMENT REPLACEMENT  2004   SYNOVECTOMY  07/22/2011   Procedure: SYNOVECTOMY;  Surgeon: Dione Plover Aluisio;  Location: [place];  Service: Orthopedics;;   TEE WITHOUT CARDIOVERSION N/A 01/23/2016   Procedure: TRANSESOPHAGEAL ECHOCARDIOGRAM (TEE);  Surgeon: [name], MD;  Location: [place];  Service: Cardiovascular;  Laterality: N/A;   TEE WITHOUT CARDIOVERSION N/A 03/25/2016   Procedure: TRANSESOPHAGEAL ECHOCARDIOGRAM (TEE);  Surgeon: [name], MD;   Location: [place];  Service: Open Heart Surgery;  Laterality: N/A;   TEE WITHOUT CARDIOVERSION N/A 03/04/2021   Procedure: TRANSESOPHAGEAL ECHOCARDIOGRAM (TEE);  Surgeon: Lelon Perla, MD;  Location[facility] ENDOSCOPY;  Service: Cardiovascular;  Laterality: N/A;   UMBILICAL HERNIA REPAIR  01-15-09   W/ GASTRIC BANDING PROCEDURE    Medications: I have reviewed the patient's current medications. Allergies:  Allergies  Allergen Reactions   Crestor [Rosuvastatin] Other (See Comments)    Muscle aches    Lipitor [Atorvastatin] Other (See Comments)    Muscle aches   Pravastatin Other (See Comments)    Muscle aches   Carvedilol Other (See Comments)    loss of appetite   Metformin Diarrhea   Serotonin Other (See Comments)    Muscle aches   Serotonin Reuptake Inhibitors (Ssris) Other (See Comments)    Muscle aches   Zocor [Simvastatin]  Other (See Comments)     Muscle pain   Codeine Itching and Other (See Comments)    Extremity tingling-- can take synthetic    Family History  Problem Relation Age of Onset   Other Mother        joint problems   Alzheimer's disease Mother    Hypertension Father    Heart disease Father    CVA Father        age 66   Depression Father    Heart disease Sister        CABG   Stroke Sister    Heart failure Sister        congestive   Diabetes Sister    Heart disease Brother    Hypertension Brother    Congestive Heart Failure Brother    Social History:  reports that he has never smoked. He has never used smokeless tobacco. He reports that he does not currently use alcohol after a past usage of about 2.0 standard drinks of alcohol per week. He reports that he does not use drugs.  Review of Systems  Genitourinary:  Positive for decreased urine volume and flank pain.  All other systems reviewed and are negative.   Physical Exam:  Vital signs in last 24 hours: Temp:  [97.8 F (36.6 C)-98.8 F (37.1 C)] 98.8 F (37.1 C) (08/19 0631) Pulse Rate:   [47-73] 71 (08/19 0631) Resp:  [11-23] 18 (08/19 0631) BP: (107-137)/(53-86) 127/85 (08/19 0631) SpO2:  [96 %-100 %] 96 % (08/19 0631) Weight:  [89.4 kg] 89.4 kg (08/18 1236) Physical Exam Vitals reviewed.  Constitutional:      Appearance: He is well-developed.  HENT:     Head: Normocephalic and atraumatic.  Cardiovascular:     Rate and Rhythm: Normal rate and regular rhythm.  Pulmonary:     Effort: Pulmonary effort is normal.  Abdominal:     General: Abdomen is flat. There is no distension.  Skin:    General: Skin is warm and dry.  Neurological:     General: No focal deficit present.     Mental Status: He is alert and oriented to person, place, and time.  Psychiatric:        Mood and Affect: Mood normal.        Behavior: Behavior normal.     Laboratory Data:  Results for orders placed or performed during the hospital encounter of 04/10/22 (from the past 72 hour(s))  Lipase, blood     Status: None   Collection Time: 04/10/22 12:40 PM  Result Value Ref Range   Lipase 42 11 - 51 U/L    Comment: Performed at [facility], [address], [place], [place] [address]  Comprehensive metabolic panel     Status: Abnormal   Collection Time: 04/10/22 12:40 PM  Result Value Ref Range   Sodium 133 (L) 135 - 145 mmol/L   Potassium 4.3 3.5 - 5.1 mmol/L   Chloride 99 98 - 111 mmol/L   CO2 21 (L) 22 - 32 mmol/L   Glucose, Bld 141 (H) 70 - 99 mg/dL    Comment: Glucose reference range applies only to samples taken after fasting for at least 8 hours.   BUN 59 (H) 8 - 23 mg/dL   Creatinine, Ser 5.58 (H) 0.61 - 1.24 mg/dL   Calcium 10.0 8.9 - 10.3 mg/dL   Total Protein 7.2 6.5 - 8.1 g/dL   Albumin 4.6 3.5 - 5.0 g/dL   AST 15 15 -  41 U/L   ALT 12 0 - 44 U/L   Alkaline Phosphatase 109 38 - 126 U/L   Total Bilirubin 0.8 0.3 - 1.2 mg/dL   GFR, Estimated 10 (L) >60 mL/min    Comment: (NOTE) Calculated using the CKD-EPI Creatinine Equation (2021)    Anion gap 13 5 -  15    Comment: Performed at [facility], [address] [place], [place] [address]  CBC     Status: Abnormal   Collection Time: 04/10/22 12:40 PM  Result Value Ref Range   WBC 12.5 (H) 4.0 - 10.5 K/uL   RBC 4.05 (L) 4.22 - 5.81 MIL/uL   Hemoglobin 13.0 13.0 - 17.0 g/dL   HCT 38.5 (L) 39.0 - 52.0 %   MCV 95.1 80.0 - 100.0 fL   MCH 32.1 26.0 - 34.0 pg   MCHC 33.8 30.0 - 36.0 g/dL   RDW 12.7 11.5 - 15.5 %   Platelets 155 150 - 400 K/uL   nRBC 0.0 0.0 - 0.2 %    Comment: Performed at [facility], [place], [place] [address]  Urinalysis, Routine w reflex microscopic Urine, Clean Catch     Status: Abnormal   Collection Time: 04/10/22 12:40 PM  Result Value Ref Range   Color, Urine YELLOW YELLOW   APPearance CLEAR CLEAR   Specific Gravity, Urine 1.013 1.005 - 1.030   pH 5.5 5.0 - 8.0   Glucose, UA NEGATIVE NEGATIVE mg/dL   Hgb urine dipstick MODERATE (A) NEGATIVE   Bilirubin Urine NEGATIVE NEGATIVE   Ketones, ur NEGATIVE NEGATIVE mg/dL   Protein, ur 30 (A) NEGATIVE mg/dL   Nitrite NEGATIVE NEGATIVE   Leukocytes,Ua NEGATIVE NEGATIVE   RBC / HPF 11-20 0 - 5 RBC/hpf   WBC, UA 0-5 0 - 5 WBC/hpf   Squamous Epithelial / LPF 0-5 0 - 5   Mucus PRESENT     Comment: Performed at [facility], [address], [place], [place] [address]  CBG monitoring, ED     Status: Abnormal   Collection Time: 04/10/22 10:16 PM  Result Value Ref Range   Glucose-Capillary 117 (H) 70 - 99 mg/dL    Comment: Glucose reference range applies only to samples taken after fasting for at least 8 hours.  Basic metabolic panel     Status: Abnormal   Collection Time: 04/11/22  4:30 AM  Result Value Ref Range   Sodium 133 (L) 135 - 145 mmol/L   Potassium 3.9 3.5 - 5.1 mmol/L   Chloride 105 98 - 111 mmol/L   CO2 20 (L) 22 - 32 mmol/L   Glucose, Bld 116 (H) 70 - 99 mg/dL    Comment: Glucose reference range applies only to samples taken after  fasting for at least 8 hours.   BUN 59 (H) 8 - 23 mg/dL   Creatinine, Ser 5.47 (H) 0.61 - 1.24 mg/dL   Calcium 9.1 8.9 - 10.3 mg/dL   GFR, Estimated 10 (L) >60 mL/min    Comment: (NOTE) Calculated using the CKD-EPI Creatinine Equation (2021)    Anion gap 8 5 - 15    Comment: Performed at [facility], [place] [address], [place], [place] [address]  CBC     Status: Abnormal   Collection Time: 04/11/22  4:30 AM  Result Value Ref Range   WBC 9.6 4.0 - 10.5 K/uL   RBC 3.48 (L) 4.22 - 5.81 MIL/uL   Hemoglobin 11.4 (L) 13.0 - 17.0 g/dL  HCT 34.7 (L) 39.0 - 52.0 %   MCV 99.7 80.0 - 100.0 fL   MCH 32.8 26.0 - 34.0 pg   MCHC 32.9 30.0 - 36.0 g/dL   RDW 12.8 11.5 - 15.5 %   Platelets 123 (L) 150 - 400 K/uL   nRBC 0.0 0.0 - 0.2 %    Comment: Performed at [facility], [place] [address] [place], [place] [address]  CBG monitoring, ED     Status: Abnormal   Collection Time: 04/11/22  8:40 AM  Result Value Ref Range   Glucose-Capillary 132 (H) 70 - 99 mg/dL    Comment: Glucose reference range applies only to samples taken after fasting for at least 8 hours.   No results found for this or any previous visit (from the past 240 hour(s)). Creatinine: Recent Labs    04/08/22 1430 04/10/22 1240 04/11/22 0430  CREATININE 3.90* 5.58* 5.47*   Baseline Creatinine: 3  Impression/Assessment:  75yo with right ureteral calculus, acute renal failure  Plan:  -We discussed the management of kidney stones. These options include observation, ureteroscopy, shockwave lithotripsy (ESWL) and percutaneous nephrolithotomy (PCNL). We discussed which options are relevant to the patient's stone(s). We discussed the natural history of kidney stones as well as the complications of untreated stones and the impact on quality of life without treatment as well as with each of the above listed treatments. We also discussed the efficacy of each treatment in its ability to clear the stone  burden. With any of these management options I discussed the signs and symptoms of infection and the need for emergent treatment should these be experienced. For each option we discussed the ability of each procedure to clear the patient of their stone burden.   For observation I described the risks which include but are not limited to silent renal damage, life-threatening infection, need for emergent surgery, failure to pass stone and pain.   For ureteroscopy I described the risks which include bleeding, infection, damage to contiguous structures, positioning injury, ureteral stricture, ureteral avulsion, ureteral injury, need for prolonged ureteral stent, inability to perform ureteroscopy, need for an interval procedure, inability to clear stone burden, stent discomfort/pain, heart attack, stroke, pulmonary embolus and the inherent risks with general anesthesia.   For shockwave lithotripsy I described the risks which include arrhythmia, kidney contusion, kidney hemorrhage, need for transfusion, pain, inability to adequately break up stone, inability to pass stone fragments, Steinstrasse, infection associated with obstructing stones, need for alternate surgical procedure, need for repeat shockwave lithotripsy, MI, CVA, PE and the inherent risks with anesthesia/conscious sedation.   For PCNL I described the risks including positioning injury, pneumothorax, hydrothorax, need for chest tube, inability to clear stone burden, renal laceration, arterial venous fistula or malformation, need for embolization of kidney, loss of kidney or renal function, need for repeat procedure, need for prolonged nephrostomy tube, ureteral avulsion, MI, CVA, PE and the inherent risks of general anesthesia.   - The patient would like to proceed with right ureteral stent placement  Nicolette Bang 04/11/2022, 9:19 AM

## 2022-04-11 NOTE — Transfer of Care (Signed)
Immediate Anesthesia Transfer of Care Note  Patient: [name]  Procedure(s) Performed: CYSTOSCOPY WITH RETROGRADE PYELOGRAM/URETERAL STENT PLACEMENT (Right: Ureter)  Patient Location: PACU  Anesthesia Type:General  Level of Consciousness: awake, alert , oriented and patient cooperative  Airway & Oxygen Therapy: Patient Spontanous Breathing and Patient connected to face mask oxygen  Post-op Assessment: Report given to RN, Post -op Vital signs reviewed and stable and Patient moving all extremities  Post vital signs: Reviewed and stable  Last Vitals:  Vitals Value Taken Time  BP 121/63 04/11/22 1233  Temp    Pulse 74 04/11/22 1234  Resp 14 04/11/22 1234  SpO2 100 % 04/11/22 1234  Vitals shown include unvalidated device data.  Last Pain:  Vitals:   04/11/22 0631  TempSrc: Oral  PainSc: 0-No pain         Complications: No notable events documented.

## 2022-04-12 DIAGNOSIS — N179 Acute kidney failure, unspecified: Secondary | ICD-10-CM | POA: Diagnosis not present

## 2022-04-12 DIAGNOSIS — N132 Hydronephrosis with renal and ureteral calculous obstruction: Secondary | ICD-10-CM | POA: Diagnosis not present

## 2022-04-12 DIAGNOSIS — I5032 Chronic diastolic (congestive) heart failure: Secondary | ICD-10-CM | POA: Diagnosis not present

## 2022-04-12 DIAGNOSIS — I482 Chronic atrial fibrillation, unspecified: Secondary | ICD-10-CM | POA: Diagnosis not present

## 2022-04-12 LAB — CBC
HCT: 31.7 % — ABNORMAL LOW (ref 39.0–52.0)
Hemoglobin: 10.9 g/dL — ABNORMAL LOW (ref 13.0–17.0)
MCH: 32.9 pg (ref 26.0–34.0)
MCHC: 34.4 g/dL (ref 30.0–36.0)
MCV: 95.8 fL (ref 80.0–100.0)
Platelets: 131 10*3/uL — ABNORMAL LOW (ref 150–400)
RBC: 3.31 MIL/uL — ABNORMAL LOW (ref 4.22–5.81)
RDW: 12.2 % (ref 11.5–15.5)
WBC: 5.3 10*3/uL (ref 4.0–10.5)
nRBC: 0 % (ref 0.0–0.2)

## 2022-04-12 LAB — GLUCOSE, CAPILLARY
Glucose-Capillary: 207 mg/dL — ABNORMAL HIGH (ref 70–99)
Glucose-Capillary: 189 mg/dL — ABNORMAL HIGH (ref 70–99)
Glucose-Capillary: 160 mg/dL — ABNORMAL HIGH (ref 70–99)
Glucose-Capillary: 186 mg/dL — ABNORMAL HIGH (ref 70–99)

## 2022-04-12 LAB — BASIC METABOLIC PANEL
Anion gap: 8 (ref 5–15)
BUN: 70 mg/dL — ABNORMAL HIGH (ref 8–23)
CO2: 19 mmol/L — ABNORMAL LOW (ref 22–32)
Calcium: 9.4 mg/dL (ref 8.9–10.3)
Chloride: 105 mmol/L (ref 98–111)
Creatinine, Ser: 5.32 mg/dL — ABNORMAL HIGH (ref 0.61–1.24)
GFR, Estimated: 10 mL/min — ABNORMAL LOW (ref >60)
Glucose, Bld: 215 mg/dL — ABNORMAL HIGH (ref 70–99)
Potassium: 4.6 mmol/L (ref 3.5–5.1)
Sodium: 132 mmol/L — ABNORMAL LOW (ref 135–145)

## 2022-04-12 MED ORDER — POLYETHYLENE GLYCOL 3350 17 G PO PACK
17.0000 g | PACK | Freq: Every day | ORAL | Status: DC
  Administered 2022-04-12 – 2022-04-13 (×2): 17 g via ORAL
  Filled 2022-04-12 (×2): qty 1

## 2022-04-12 MED ORDER — BISACODYL 5 MG PO TBEC
10.0000 mg | DELAYED_RELEASE_TABLET | Freq: Once | ORAL | Status: AC
  Administered 2022-04-12: 10 mg via ORAL
  Filled 2022-04-12: qty 2

## 2022-04-12 MED ORDER — SODIUM CHLORIDE 0.45 % IV SOLN: INTRAVENOUS | Status: DC

## 2022-04-12 NOTE — Progress Notes (Signed)
PROGRESS NOTE    [name]  WYO:[id number] DOB: May 30, 1946 DOA: 04/10/2022 PCP: [name], MD    Brief Narrative:  76 year old male with a history of chronic kidney disease stage IV, diabetes, chronic atrial fibrillation anticoagulation, diastolic heart failure, presents to the emergency room with right flank pain.  Found to have obstructing right ureteral stone.  He has also had acute kidney injury.  Seen by urology and underwent ureteral stent placement.   Assessment & Plan:   Principal Problem:   Right ureteral stone with hydronephrosis Active Problems:   Acute renal failure superimposed on stage 4 chronic kidney disease (HCC)   Atrial fibrillation, chronic (HCC)   Diastolic CHF, chronic (HCC)   CAD, AUTOLOGOUS BYPASS GRAFT   Mixed hyperlipidemia   Obstructive sleep apnea of adult   Diabetes mellitus type II, controlled ([place])   AKI on CKD stage IV -Baseline creatinine appears to be around 4.0 -He was admitted with creatinine of 5.5 -This was likely secondary to obstructing stone -He is status post ureteral stent placement -BUN/Creat still elevated at 5.3 -will give a trial of IV fluids overnight and recheck labs in AM  Right ureteral stone with hydronephrosis -Seen by urology -Status post right ureteral stent placement -Plans are for stone extraction in 2 weeks  Chronic diastolic congestive heart failure -Currently appears to be euvolemic -Holding Lasix for now  Chronic atrial fibrillation -Continued on amiodarone -Continued on Eliquis, this was not held on admission -Reportedly scheduled for cardioversion with Dr. Aundra Dubin on 8/21.  We will try and send message to cardiology service so they are aware.  Suspect that cardioversion may need to be postponed in the setting of acute illness  Diabetes, type II -Ozempic on hold on admission -Continue on sliding scale insulin  Obstructive sleep apnea -Continue on CPAP   DVT prophylaxis: apixaban (ELIQUIS)  tablet 5 mg Start: 04/11/22 2200 Place and maintain sequential compression device Start: 04/11/22 0919 apixaban (ELIQUIS) tablet 5 mg  Code Status: Full code Family Communication: Updated wife at the bedside Disposition Plan: Status is: Inpatient Remains inpatient appropriate because: Continued monitoring of renal function     Consultants:  Urology  Procedures:  Right ureteral stent placement  Antimicrobials:      Subjective: Feels well. Has not had a BM in several days. Urine is dark colored.  Objective: Vitals:   04/11/22 2058 04/12/22 0112 04/12/22 0445 04/12/22 1246  BP:  116/65 120/78 (!) 140/98  Pulse:  64 65 69  Resp: '18 20 19   '$ Temp:  97.7 F (36.5 C) 98 F (36.7 C) 98.1 F (36.7 C)  TempSrc:  Oral Oral Oral  SpO2:  97% 100% 99%  Weight:      Height:        Intake/Output Summary (Last 24 hours) at 04/12/2022 1531 Last data filed at 04/12/2022 1400 Gross per 24 hour  Intake 1540 ml  Output 1725 ml  Net -185 ml   Filed Weights   04/10/22 1236  Weight: 89.4 kg    Examination:  General exam: Appears calm and comfortable  Respiratory system: Clear to auscultation. Respiratory effort normal. Cardiovascular system: S1 & S2 heard, irregular. No JVD, murmurs, rubs, gallops or clicks. No pedal edema. Gastrointestinal system: Abdomen is nondistended, soft and nontender. No organomegaly or masses felt. Normal bowel sounds heard. Central nervous system: Alert and oriented. No focal neurological deficits. Extremities: Symmetric 5 x 5 power. Skin: No rashes, lesions or ulcers Psychiatry: Judgement and insight appear normal. Mood &  affect appropriate.     Data Reviewed: I have personally reviewed following labs and imaging studies  CBC: Recent Labs  Lab 04/08/22 1430 04/10/22 1240 04/11/22 0430 04/12/22 0430  WBC 6.4 12.5* 9.6 5.3  HGB 12.9* 13.0 11.4* 10.9*  HCT 37.7* 38.5* 34.7* 31.7*  MCV 95.0 95.1 99.7 95.8  PLT 148* 155 123* 131*   Basic  Metabolic Panel: Recent Labs  Lab 04/08/22 1430 04/10/22 1240 04/11/22 0430 04/12/22 0430  NA 138 133* 133* 132*  K 4.0 4.3 3.9 4.6  CL 103 99 105 105  CO2 26 21* 20* 19*  GLUCOSE 213* 141* 116* 215*  BUN 47* 59* 59* 70*  CREATININE 3.90* 5.58* 5.47* 5.32*  CALCIUM 10.3 10.0 9.1 9.4   GFR: Estimated Creatinine Clearance: 13.1 mL/min (A) (by C-G formula based on SCr of 5.32 mg/dL (H)). Liver Function Tests: Recent Labs  Lab 04/08/22 1430 04/10/22 1240  AST 16 15  ALT 13 12  ALKPHOS 109 109  BILITOT 0.6 0.8  PROT 6.4* 7.2  ALBUMIN 4.0 4.6   Recent Labs  Lab 04/10/22 1240  LIPASE 42   No results for input(s): "AMMONIA" in the last 168 hours. Coagulation Profile: No results for input(s): "INR", "PROTIME" in the last 168 hours. Cardiac Enzymes: No results for input(s): "CKTOTAL", "CKMB", "CKMBINDEX", "TROPONINI" in the last 168 hours. BNP (last 3 results) No results for input(s): "PROBNP" in the last 8760 hours. HbA1C: No results for input(s): "HGBA1C" in the last 72 hours. CBG: Recent Labs  Lab 04/11/22 1234 04/11/22 1628 04/11/22 2039 04/12/22 0727 04/12/22 1151  GLUCAP 112* 156* 255* 207* 189*   Lipid Profile: No results for input(s): "CHOL", "HDL", "LDLCALC", "TRIG", "CHOLHDL", "LDLDIRECT" in the last 72 hours. Thyroid Function Tests: No results for input(s): "TSH", "T4TOTAL", "FREET4", "T3FREE", "THYROIDAB" in the last 72 hours. Anemia Panel: No results for input(s): "VITAMINB12", "FOLATE", "FERRITIN", "TIBC", "IRON", "RETICCTPCT" in the last 72 hours. Sepsis Labs: No results for input(s): "PROCALCITON", "LATICACIDVEN" in the last 168 hours.  No results found for this or any previous visit (from the past 240 hour(s)).       Radiology Studies: DG C-Arm 1-60 Min-No Report  Result Date: 04/11/2022 Fluoroscopy was utilized by the requesting physician.  No radiographic interpretation.        Scheduled Meds:  amiodarone  200 mg Oral Daily    apixaban  5 mg Oral BID   ezetimibe  10 mg Oral Daily   insulin aspart  0-9 Units Subcutaneous TID WC   polyethylene glycol  17 g Oral Daily   sodium chloride flush  3 mL Intravenous Q12H   traZODone  100 mg Oral QHS   Continuous Infusions:  sodium chloride 100 mL/hr at 04/12/22 1453     LOS: 2 days    Time spent: 80mns    JKathie Dike MD [facility]   If 7PM-7AM, please contact night-coverage [email or web address]  04/12/2022, 3:31 PM

## 2022-04-12 NOTE — Progress Notes (Signed)
1 Day Post-Op Subjective: Patient reports improved right flank pain. Urine is light pink and improving. Creatinine 5.3  Objective: Vital signs in last 24 hours: Temp:  [97.7 F (36.5 C)-98.1 F (36.7 C)] 98.1 F (36.7 C) (08/20 1246) Pulse Rate:  [64-77] 69 (08/20 1246) Resp:  [18-20] 19 (08/20 0445) BP: (115-140)/(65-98) 140/98 (08/20 1246) SpO2:  [96 %-100 %] 99 % (08/20 1246)  Intake/Output from previous day: 08/19 0701 - 08/20 0700 In: 1898 [P.O.:1180; I.V.:618; IV Piggyback:100] Out: 1856 [Urine:1725] Intake/Output this shift: Total I/O In: 588.5 [P.O.:480; I.V.:108.5] Out: 500 [Urine:500]  Physical Exam:  General:alert, cooperative, and appears stated age GI: soft, non tender, normal bowel sounds, no palpable masses, no organomegaly, no inguinal hernia Male genitalia: not done Extremities: extremities normal, atraumatic, no cyanosis or edema  Lab Results: Recent Labs    04/10/22 1240 04/11/22 0430 04/12/22 0430  HGB 13.0 11.4* 10.9*  HCT 38.5* 34.7* 31.7*   BMET Recent Labs    04/11/22 0430 04/12/22 0430  NA 133* 132*  K 3.9 4.6  CL 105 105  CO2 20* 19*  GLUCOSE 116* 215*  BUN 59* 70*  CREATININE 5.47* 5.32*  CALCIUM 9.1 9.4   No results for input(s): "LABPT", "INR" in the last 72 hours. No results for input(s): "LABURIN" in the last 72 hours. Results for orders placed or performed during the hospital encounter of 08/28/21  Culture, blood (routine x 2)     Status: None   Collection Time: 08/28/21  6:54 PM   Specimen: BLOOD RIGHT FOREARM  Result Value Ref Range Status   Specimen Description   Final    BLOOD RIGHT FOREARM Performed at [facility], [address] [place], [place] [address]    Special Requests   Final    BOTTLES DRAWN AEROBIC AND ANAEROBIC Blood Culture adequate volume Performed at [facility], [address] [place], [place] [address]    Culture   Final    NO GROWTH 5 DAYS Performed at [facility], [place] [address] [place], [place] [address]    Report Status 09/02/2021 FINAL  Final  Culture, blood (routine x 2)     Status: None   Collection Time: 08/28/21  6:54 PM   Specimen: BLOOD RIGHT HAND  Result Value Ref Range Status   Specimen Description   Final    BLOOD RIGHT HAND Performed at [facility], [address] [place], [place] [address]    Special Requests   Final    BOTTLES DRAWN AEROBIC AND ANAEROBIC Blood Culture adequate volume Performed at [facility], [address] [place], [place] [address]    Culture   Final    NO GROWTH 5 DAYS Performed at [facility], [place] [address] [place], [place] [address]    Report Status 09/02/2021 FINAL  Final  Resp Panel by RT-PCR (Flu A&B, Covid) Nasopharyngeal Swab     Status: None   Collection Time: 08/28/21  7:34 PM   Specimen: Nasopharyngeal Swab; Nasopharyngeal(NP) swabs in vial transport medium  Result Value Ref Range Status   SARS Coronavirus 2 by RT PCR NEGATIVE NEGATIVE Final    Comment: (NOTE) SARS-CoV-2 target nucleic acids are NOT DETECTED.  The SARS-CoV-2 RNA is generally detectable in upper respiratory specimens during the acute phase of infection. The lowest concentration of SARS-CoV-2 viral copies this assay can detect is 138 copies/mL. A negative result does not preclude SARS-Cov-2  infection and should not be used as the sole basis for treatment or other patient management decisions. A negative result may occur with  improper specimen collection/handling, submission of specimen other than nasopharyngeal swab, presence of viral mutation(s) within the areas targeted by this assay, and inadequate number of viral copies(<138 copies/mL). A negative result must be combined with clinical observations, patient history, and epidemiological information. The expected result is Negative.  Fact Sheet for Patients:  EntrepreneurPulse.com.au  Fact Sheet for Healthcare  Providers:  IncredibleEmployment.be  This test is no t yet approved or cleared by the [place] FDA and  has been authorized for detection and/or diagnosis of SARS-CoV-2 by FDA under an Emergency Use Authorization (EUA). This EUA will remain  in effect (meaning this test can be used) for the duration of the COVID-19 declaration under Section 564(b)(1) of the Act, 21 U.S.C.section 360bbb-3(b)(1), unless the authorization is terminated  or revoked sooner.       Influenza A by PCR NEGATIVE NEGATIVE Final   Influenza B by PCR NEGATIVE NEGATIVE Final    Comment: (NOTE) The Xpert Xpress SARS-CoV-2/FLU/RSV plus assay is intended as an aid in the diagnosis of influenza from Nasopharyngeal swab specimens and should not be used as a sole basis for treatment. Nasal washings and aspirates are unacceptable for Xpert Xpress SARS-CoV-2/FLU/RSV testing.  Fact Sheet for Patients: EntrepreneurPulse.com.au  Fact Sheet for Healthcare Providers: IncredibleEmployment.be  This test is not yet approved or cleared by the [place] FDA and has been authorized for detection and/or diagnosis of SARS-CoV-2 by FDA under an Emergency Use Authorization (EUA). This EUA will remain in effect (meaning this test can be used) for the duration of the COVID-19 declaration under Section 564(b)(1) of the Act, 21 U.S.C. section 360bbb-3(b)(1), unless the authorization is terminated or revoked.  Performed at [facility], [address] [place], [place] [address]   Aerobic/Anaerobic Culture w Gram Stain (surgical/deep wound)     Status: None   Collection Time: 09/01/21  3:02 PM   Specimen: PATH Soft tissue  Result Value Ref Range Status   Specimen Description   Final    ABSCESS PERIRECTAL Performed at [facility], [place] [address] [place], [place] [address]    Special Requests   Final    NONE Performed at [facility], [place] [address], [place], [place] [address]    Gram Stain   Final    MODERATE WBC PRESENT,BOTH PMN AND MONONUCLEAR FEW GRAM POSITIVE COCCI Performed at [facility], [place] [address] [place], [place] [address]    Culture   Final    MODERATE METHICILLIN RESISTANT STAPHYLOCOCCUS AUREUS MIXED ANAEROBIC FLORA PRESENT.  CALL LAB IF FURTHER IID REQUIRED.    Report Status 09/06/2021 FINAL  Final   Organism ID, Bacteria METHICILLIN RESISTANT STAPHYLOCOCCUS AUREUS  Final      Susceptibility   Methicillin resistant staphylococcus aureus - MIC*    CIPROFLOXACIN <=0.5 SENSITIVE Sensitive     ERYTHROMYCIN >=8 RESISTANT Resistant     GENTAMICIN <=0.5 SENSITIVE Sensitive     OXACILLIN >=4 RESISTANT Resistant     TETRACYCLINE <=1 SENSITIVE Sensitive     VANCOMYCIN 1 SENSITIVE Sensitive     TRIMETH/SULFA <=10 SENSITIVE Sensitive     CLINDAMYCIN <=0.25 SENSITIVE Sensitive     RIFAMPIN <=0.5 SENSITIVE Sensitive     Inducible Clindamycin NEGATIVE Sensitive     * MODERATE METHICILLIN RESISTANT STAPHYLOCOCCUS AUREUS    Studies/Results: DG C-Arm 1-60  Min-No Report  Result Date: 04/11/2022 Fluoroscopy was utilized by the requesting physician.  No radiographic interpretation.    Assessment/Plan: POD#1 right ureteral stent placement -Continue to monitor creatinine -patient will sbe scheduled for right ureteroscopic stone extraction in 2 weeks   LOS: 2 days   Nicolette Bang 04/12/2022, 4:18 PM

## 2022-04-13 ENCOUNTER — Encounter (HOSPITAL_COMMUNITY): Admission: RE | Payer: Self-pay | Source: Home / Self Care

## 2022-04-13 ENCOUNTER — Ambulatory Visit (HOSPITAL_COMMUNITY): Admission: RE | Admit: 2022-04-13 | Payer: Medicare Other | Source: Home / Self Care | Admitting: Cardiology

## 2022-04-13 ENCOUNTER — Encounter (HOSPITAL_COMMUNITY): Payer: Self-pay | Admitting: Urology

## 2022-04-13 DIAGNOSIS — I5032 Chronic diastolic (congestive) heart failure: Secondary | ICD-10-CM | POA: Diagnosis not present

## 2022-04-13 DIAGNOSIS — I482 Chronic atrial fibrillation, unspecified: Secondary | ICD-10-CM | POA: Diagnosis not present

## 2022-04-13 DIAGNOSIS — N179 Acute kidney failure, unspecified: Secondary | ICD-10-CM | POA: Diagnosis not present

## 2022-04-13 DIAGNOSIS — N132 Hydronephrosis with renal and ureteral calculous obstruction: Secondary | ICD-10-CM | POA: Diagnosis not present

## 2022-04-13 LAB — BASIC METABOLIC PANEL
Anion gap: 9 (ref 5–15)
BUN: 75 mg/dL — ABNORMAL HIGH (ref 8–23)
CO2: 21 mmol/L — ABNORMAL LOW (ref 22–32)
Calcium: 9.2 mg/dL (ref 8.9–10.3)
Chloride: 106 mmol/L (ref 98–111)
Creatinine, Ser: 5.04 mg/dL — ABNORMAL HIGH (ref 0.61–1.24)
GFR, Estimated: 11 mL/min — ABNORMAL LOW (ref >60)
Glucose, Bld: 140 mg/dL — ABNORMAL HIGH (ref 70–99)
Potassium: 3.9 mmol/L (ref 3.5–5.1)
Sodium: 136 mmol/L (ref 135–145)

## 2022-04-13 LAB — GLUCOSE, CAPILLARY
Glucose-Capillary: 133 mg/dL — ABNORMAL HIGH (ref 70–99)
Glucose-Capillary: 136 mg/dL — ABNORMAL HIGH (ref 70–99)
Glucose-Capillary: 123 mg/dL — ABNORMAL HIGH (ref 70–99)

## 2022-04-13 SURGERY — CARDIOVERSION
Anesthesia: Monitor Anesthesia Care

## 2022-04-13 MED ORDER — FUROSEMIDE 40 MG PO TABS
40.0000 mg | ORAL_TABLET | Freq: Once | ORAL | Status: AC
  Administered 2022-04-13: 40 mg via ORAL
  Filled 2022-04-13: qty 1

## 2022-04-13 MED ORDER — SODIUM CHLORIDE 0.45 % IV SOLN: INTRAVENOUS | Status: DC

## 2022-04-13 NOTE — Anesthesia Postprocedure Evaluation (Signed)
Anesthesia Post Note  Patient: [name]  Procedure(s) Performed: CYSTOSCOPY WITH RETROGRADE PYELOGRAM/URETERAL STENT PLACEMENT (Right: Ureter)     Patient location during evaluation: PACU Anesthesia Type: General Level of consciousness: awake and alert Pain management: pain level controlled Vital Signs Assessment: post-procedure vital signs reviewed and stable Respiratory status: spontaneous breathing, nonlabored ventilation, respiratory function stable and patient connected to nasal cannula oxygen Cardiovascular status: blood pressure returned to baseline and stable Postop Assessment: no apparent nausea or vomiting Anesthetic complications: no   No notable events documented.  Last Vitals:  Vitals:   04/12/22 2218 04/13/22 0514  BP: 131/67 112/70  Pulse: (!) 55 61  Resp: 18 18  Temp: 36.7 C 36.5 C  SpO2: 99% 99%    Last Pain:  Vitals:   04/13/22 0514  TempSrc: Oral  PainSc:                  March Rummage Dell Hurtubise

## 2022-04-13 NOTE — TOC Progression Note (Signed)
Transition of Care (TOC) - Progression Note   Transition of Care (TOC) Screening Note  Patient Details  Name: [name] Date of Birth: [birth year]  Transition of Care [facility] CM/SW Contact:    Sherie Don, LCSW Phone Number: 04/13/2022, 11:21 AM  Transition of Care Department [facility]) has reviewed patient and no TOC needs have been identified at this time. We will continue to monitor patient advancement through interdisciplinary progression rounds. If new patient transition needs arise, please place a TOC consult.  Barriers to Discharge: Continued Medical Work up  Readmission Risk Interventions    04/13/2022   11:19 AM 08/29/2021   11:31 AM  Readmission Risk Prevention Plan  Transportation Screening Complete Complete  Home Care Screening Complete   Medication Review (RN CM) Complete   HRI or Home Care Consult  Complete  Social Work Consult for [place] Planning/Counseling  Complete  Palliative Care Screening  Not Applicable  Medication Review [employer])  Complete

## 2022-04-13 NOTE — Progress Notes (Deleted)
Transition of Care [facility]) Screening Note  Patient Details  Name: FRANCIS DOENGES Date of Birth: January 31, 1946  Transition of Care [facility]) CM/SW Contact:    Sherie Don, LCSW Phone Number: 04/13/2022, 11:20 AM  Transition of Care Department [facility]) has reviewed patient and no TOC needs have been identified at this time. We will continue to monitor patient advancement through interdisciplinary progression rounds. If new patient transition needs arise, please place a TOC consult.

## 2022-04-13 NOTE — Progress Notes (Signed)
PROGRESS NOTE    [name]  MCN:[id number] DOB: Dec 06, 1945 DOA: 04/10/2022 PCP: [name], MD    Brief Narrative:  76 year old male with a history of chronic kidney disease stage IV, diabetes, chronic atrial fibrillation anticoagulation, diastolic heart failure, presents to the emergency room with right flank pain.  Found to have obstructing right ureteral stone.  He has also had acute kidney injury.  Seen by urology and underwent ureteral stent placement.  He was scheduled to undergo TEE cardioversion.  Discussed with cardiology and this will be planned for 8/22   Assessment & Plan:   Principal Problem:   Right ureteral stone with hydronephrosis Active Problems:   Acute renal failure superimposed on stage 4 chronic kidney disease (HCC)   Atrial fibrillation, chronic (HCC)   Diastolic CHF, chronic (HCC)   CAD, AUTOLOGOUS BYPASS GRAFT   Mixed hyperlipidemia   Obstructive sleep apnea of adult   Diabetes mellitus type II, controlled ([place])   AKI on CKD stage IV -Baseline creatinine appears to be around 4.0 -He was admitted with creatinine of 5.5 -This was likely secondary to obstructing stone -He is status post ureteral stent placement -BUN/Creat still elevated, but has improved to 5.0 -We will avoid continuing IV fluids also not precipitate CHF exacerbation -Restart home dose of Lasix  Right ureteral stone with hydronephrosis -Seen by urology -Status post right ureteral stent placement -Plans are for stone extraction in 2 weeks  Chronic diastolic congestive heart failure -Currently appears to be euvolemic -Restart home dose of Lasix  Chronic atrial fibrillation -Continued on amiodarone -Continued on Eliquis, this was not held on admission -Discussed with Dr. Aundra Dubin, will plan on TEE cardioversion on 8/22  Diabetes, type II -Ozempic on hold on admission -Continue on sliding scale insulin  Obstructive sleep apnea -Continue on CPAP   DVT prophylaxis:  apixaban (ELIQUIS) tablet 5 mg Start: 04/11/22 2200 Place and maintain sequential compression device Start: 04/11/22 0919 apixaban (ELIQUIS) tablet 5 mg  Code Status: Full code Family Communication: Updated wife at the bedside Disposition Plan: Status is: Inpatient Remains inpatient appropriate because: Continued monitoring of renal function     Consultants:  Urology  Procedures:  Right ureteral stent placement  Antimicrobials:      Subjective: Urine is bloody.  He does not have any abdominal pain.  Denies any shortness of breath.  Objective: Vitals:   04/12/22 1246 04/12/22 2218 04/13/22 0514 04/13/22 1316  BP: (!) 140/98 131/67 112/70 134/81  Pulse: 69 (!) 55 61 61  Resp:  '18 18 16  '$ Temp: 98.1 F (36.7 C) 98.1 F (36.7 C) 97.7 F (36.5 C) 97.6 F (36.4 C)  TempSrc: Oral Oral Oral Oral  SpO2: 99% 99% 99% 100%  Weight:      Height:        Intake/Output Summary (Last 24 hours) at 04/13/2022 1959 Last data filed at 04/13/2022 1800 Gross per 24 hour  Intake 2732.58 ml  Output 2700 ml  Net 32.58 ml   Filed Weights   04/10/22 1236  Weight: 89.4 kg    Examination:  General exam: Appears calm and comfortable  Respiratory system: Clear to auscultation. Respiratory effort normal. Cardiovascular system: S1 & S2 heard, irregular. No JVD, murmurs, rubs, gallops or clicks. No pedal edema. Gastrointestinal system: Abdomen is nondistended, soft and nontender. No organomegaly or masses felt. Normal bowel sounds heard. Central nervous system: Alert and oriented. No focal neurological deficits. Extremities: Symmetric 5 x 5 power. Skin: No rashes, lesions or ulcers Psychiatry:  Judgement and insight appear normal. Mood & affect appropriate.     Data Reviewed: I have personally reviewed following labs and imaging studies  CBC: Recent Labs  Lab 04/08/22 1430 04/10/22 1240 04/11/22 0430 04/12/22 0430  WBC 6.4 12.5* 9.6 5.3  HGB 12.9* 13.0 11.4* 10.9*  HCT 37.7*  38.5* 34.7* 31.7*  MCV 95.0 95.1 99.7 95.8  PLT 148* 155 123* 837*   Basic Metabolic Panel: Recent Labs  Lab 04/08/22 1430 04/10/22 1240 04/11/22 0430 04/12/22 0430 04/13/22 0430  NA 138 133* 133* 132* 136  K 4.0 4.3 3.9 4.6 3.9  CL 103 99 105 105 106  CO2 26 21* 20* 19* 21*  GLUCOSE 213* 141* 116* 215* 140*  BUN 47* 59* 59* 70* 75*  CREATININE 3.90* 5.58* 5.47* 5.32* 5.04*  CALCIUM 10.3 10.0 9.1 9.4 9.2   GFR: Estimated Creatinine Clearance: 13.8 mL/min (A) (by C-G formula based on SCr of 5.04 mg/dL (H)). Liver Function Tests: Recent Labs  Lab 04/08/22 1430 04/10/22 1240  AST 16 15  ALT 13 12  ALKPHOS 109 109  BILITOT 0.6 0.8  PROT 6.4* 7.2  ALBUMIN 4.0 4.6   Recent Labs  Lab 04/10/22 1240  LIPASE 42   No results for input(s): "AMMONIA" in the last 168 hours. Coagulation Profile: No results for input(s): "INR", "PROTIME" in the last 168 hours. Cardiac Enzymes: No results for input(s): "CKTOTAL", "CKMB", "CKMBINDEX", "TROPONINI" in the last 168 hours. BNP (last 3 results) No results for input(s): "PROBNP" in the last 8760 hours. HbA1C: No results for input(s): "HGBA1C" in the last 72 hours. CBG: Recent Labs  Lab 04/12/22 1718 04/12/22 2221 04/13/22 0741 04/13/22 1140 04/13/22 1630  GLUCAP 160* 186* 133* 136* 123*   Lipid Profile: No results for input(s): "CHOL", "HDL", "LDLCALC", "TRIG", "CHOLHDL", "LDLDIRECT" in the last 72 hours. Thyroid Function Tests: No results for input(s): "TSH", "T4TOTAL", "FREET4", "T3FREE", "THYROIDAB" in the last 72 hours. Anemia Panel: No results for input(s): "VITAMINB12", "FOLATE", "FERRITIN", "TIBC", "IRON", "RETICCTPCT" in the last 72 hours. Sepsis Labs: No results for input(s): "PROCALCITON", "LATICACIDVEN" in the last 168 hours.  No results found for this or any previous visit (from the past 240 hour(s)).       Radiology Studies: No results found.      Scheduled Meds:  amiodarone  200 mg Oral Daily    apixaban  5 mg Oral BID   ezetimibe  10 mg Oral Daily   insulin aspart  0-9 Units Subcutaneous TID WC   polyethylene glycol  17 g Oral Daily   sodium chloride flush  3 mL Intravenous Q12H   traZODone  100 mg Oral QHS   Continuous Infusions:  sodium chloride Stopped (04/13/22 1739)     LOS: 3 days    Time spent: 51mns    JKathie Dike MD [facility]   If 7PM-7AM, please contact night-coverage [email or web address]  04/13/2022, 7:59 PM

## 2022-04-13 NOTE — H&P (View-Only) (Signed)
PROGRESS NOTE    [name]  WUJ:[id number] DOB: [birth year] DOA: 04/10/2022 PCP: [name], MD    Brief Narrative:  76 year old male with a history of chronic kidney disease stage IV, diabetes, chronic atrial fibrillation anticoagulation, diastolic heart failure, presents to the emergency room with right flank pain.  Found to have obstructing right ureteral stone.  He has also had acute kidney injury.  Seen by urology and underwent ureteral stent placement.  He was scheduled to undergo TEE cardioversion.  Discussed with cardiology and this will be planned for 8/22   Assessment & Plan:   Principal Problem:   Right ureteral stone with hydronephrosis Active Problems:   Acute renal failure superimposed on stage 4 chronic kidney disease (HCC)   Atrial fibrillation, chronic (HCC)   Diastolic CHF, chronic (HCC)   CAD, AUTOLOGOUS BYPASS GRAFT   Mixed hyperlipidemia   Obstructive sleep apnea of adult   Diabetes mellitus type II, controlled (Max)   AKI on CKD stage IV -Baseline creatinine appears to be around 4.0 -He was admitted with creatinine of 5.5 -This was likely secondary to obstructing stone -He is status post ureteral stent placement -BUN/Creat still elevated, but has improved to 5.0 -We will avoid continuing IV fluids also not precipitate CHF exacerbation -Restart home dose of Lasix  Right ureteral stone with hydronephrosis -Seen by urology -Status post right ureteral stent placement -Plans are for stone extraction in 2 weeks  Chronic diastolic congestive heart failure -Currently appears to be euvolemic -Restart home dose of Lasix  Chronic atrial fibrillation -Continued on amiodarone -Continued on Eliquis, this was not held on admission -Discussed with Dr. Aundra Dubin, will plan on TEE cardioversion on 8/22  Diabetes, type II -Ozempic on hold on admission -Continue on sliding scale insulin  Obstructive sleep apnea -Continue on CPAP   DVT prophylaxis:  apixaban (ELIQUIS) tablet 5 mg Start: 04/11/22 2200 Place and maintain sequential compression device Start: 04/11/22 0919 apixaban (ELIQUIS) tablet 5 mg  Code Status: Full code Family Communication: Updated wife at the bedside Disposition Plan: Status is: Inpatient Remains inpatient appropriate because: Continued monitoring of renal function     Consultants:  Urology  Procedures:  Right ureteral stent placement  Antimicrobials:      Subjective: Urine is bloody.  He does not have any abdominal pain.  Denies any shortness of breath.  Objective: Vitals:   04/12/22 1246 04/12/22 2218 04/13/22 0514 04/13/22 1316  BP: (!) 140/98 131/67 112/70 134/81  Pulse: 69 (!) 55 61 61  Resp:  '18 18 16  '$ Temp: 98.1 F (36.7 C) 98.1 F (36.7 C) 97.7 F (36.5 C) 97.6 F (36.4 C)  TempSrc: Oral Oral Oral Oral  SpO2: 99% 99% 99% 100%  Weight:      Height:        Intake/Output Summary (Last 24 hours) at 04/13/2022 1959 Last data filed at 04/13/2022 1800 Gross per 24 hour  Intake 2732.58 ml  Output 2700 ml  Net 32.58 ml   Filed Weights   04/10/22 1236  Weight: 89.4 kg    Examination:  General exam: Appears calm and comfortable  Respiratory system: Clear to auscultation. Respiratory effort normal. Cardiovascular system: S1 & S2 heard, irregular. No JVD, murmurs, rubs, gallops or clicks. No pedal edema. Gastrointestinal system: Abdomen is nondistended, soft and nontender. No organomegaly or masses felt. Normal bowel sounds heard. Central nervous system: Alert and oriented. No focal neurological deficits. Extremities: Symmetric 5 x 5 power. Skin: No rashes, lesions or ulcers Psychiatry:  Judgement and insight appear normal. Mood & affect appropriate.     Data Reviewed: I have personally reviewed following labs and imaging studies  CBC: Recent Labs  Lab 04/08/22 1430 04/10/22 1240 04/11/22 0430 04/12/22 0430  WBC 6.4 12.5* 9.6 5.3  HGB 12.9* 13.0 11.4* 10.9*  HCT 37.7*  38.5* 34.7* 31.7*  MCV 95.0 95.1 99.7 95.8  PLT 148* 155 123* 026*   Basic Metabolic Panel: Recent Labs  Lab 04/08/22 1430 04/10/22 1240 04/11/22 0430 04/12/22 0430 04/13/22 0430  NA 138 133* 133* 132* 136  K 4.0 4.3 3.9 4.6 3.9  CL 103 99 105 105 106  CO2 26 21* 20* 19* 21*  GLUCOSE 213* 141* 116* 215* 140*  BUN 47* 59* 59* 70* 75*  CREATININE 3.90* 5.58* 5.47* 5.32* 5.04*  CALCIUM 10.3 10.0 9.1 9.4 9.2   GFR: Estimated Creatinine Clearance: 13.8 mL/min (A) (by C-G formula based on SCr of 5.04 mg/dL (H)). Liver Function Tests: Recent Labs  Lab 04/08/22 1430 04/10/22 1240  AST 16 15  ALT 13 12  ALKPHOS 109 109  BILITOT 0.6 0.8  PROT 6.4* 7.2  ALBUMIN 4.0 4.6   Recent Labs  Lab 04/10/22 1240  LIPASE 42   No results for input(s): "AMMONIA" in the last 168 hours. Coagulation Profile: No results for input(s): "INR", "PROTIME" in the last 168 hours. Cardiac Enzymes: No results for input(s): "CKTOTAL", "CKMB", "CKMBINDEX", "TROPONINI" in the last 168 hours. BNP (last 3 results) No results for input(s): "PROBNP" in the last 8760 hours. HbA1C: No results for input(s): "HGBA1C" in the last 72 hours. CBG: Recent Labs  Lab 04/12/22 1718 04/12/22 2221 04/13/22 0741 04/13/22 1140 04/13/22 1630  GLUCAP 160* 186* 133* 136* 123*   Lipid Profile: No results for input(s): "CHOL", "HDL", "LDLCALC", "TRIG", "CHOLHDL", "LDLDIRECT" in the last 72 hours. Thyroid Function Tests: No results for input(s): "TSH", "T4TOTAL", "FREET4", "T3FREE", "THYROIDAB" in the last 72 hours. Anemia Panel: No results for input(s): "VITAMINB12", "FOLATE", "FERRITIN", "TIBC", "IRON", "RETICCTPCT" in the last 72 hours. Sepsis Labs: No results for input(s): "PROCALCITON", "LATICACIDVEN" in the last 168 hours.  No results found for this or any previous visit (from the past 240 hour(s)).       Radiology Studies: No results found.      Scheduled Meds:  amiodarone  200 mg Oral Daily    apixaban  5 mg Oral BID   ezetimibe  10 mg Oral Daily   insulin aspart  0-9 Units Subcutaneous TID WC   polyethylene glycol  17 g Oral Daily   sodium chloride flush  3 mL Intravenous Q12H   traZODone  100 mg Oral QHS   Continuous Infusions:  sodium chloride Stopped (04/13/22 1739)     LOS: 3 days    Time spent: 68mns    JKathie Dike MD [facility]   If 7PM-7AM, please contact night-coverage [email or web address]  04/13/2022, 7:59 PM

## 2022-04-14 ENCOUNTER — Inpatient Hospital Stay (HOSPITAL_COMMUNITY): Payer: Medicare Other | Admitting: Anesthesiology

## 2022-04-14 ENCOUNTER — Encounter (HOSPITAL_COMMUNITY)
Admission: EM | Disposition: A | Discharge status: Home / Self Care | Payer: Self-pay | Source: Home / Self Care | Attending: Internal Medicine

## 2022-04-14 ENCOUNTER — Encounter (HOSPITAL_COMMUNITY): Payer: Self-pay | Admitting: Internal Medicine

## 2022-04-14 ENCOUNTER — Inpatient Hospital Stay (HOSPITAL_COMMUNITY): Payer: Medicare Other

## 2022-04-14 DIAGNOSIS — N184 Chronic kidney disease, stage 4 (severe): Secondary | ICD-10-CM

## 2022-04-14 DIAGNOSIS — N179 Acute kidney failure, unspecified: Secondary | ICD-10-CM | POA: Diagnosis not present

## 2022-04-14 DIAGNOSIS — I110 Hypertensive heart disease with heart failure: Secondary | ICD-10-CM | POA: Diagnosis not present

## 2022-04-14 DIAGNOSIS — I4891 Unspecified atrial fibrillation: Secondary | ICD-10-CM

## 2022-04-14 DIAGNOSIS — I252 Old myocardial infarction: Secondary | ICD-10-CM

## 2022-04-14 DIAGNOSIS — E782 Mixed hyperlipidemia: Secondary | ICD-10-CM

## 2022-04-14 DIAGNOSIS — I5032 Chronic diastolic (congestive) heart failure: Secondary | ICD-10-CM | POA: Diagnosis not present

## 2022-04-14 DIAGNOSIS — N132 Hydronephrosis with renal and ureteral calculous obstruction: Secondary | ICD-10-CM | POA: Diagnosis not present

## 2022-04-14 DIAGNOSIS — I482 Chronic atrial fibrillation, unspecified: Secondary | ICD-10-CM | POA: Diagnosis not present

## 2022-04-14 DIAGNOSIS — I2510 Atherosclerotic heart disease of native coronary artery without angina pectoris: Secondary | ICD-10-CM

## 2022-04-14 HISTORY — PX: CARDIOVERSION: SHX1299

## 2022-04-14 HISTORY — PX: TEE WITHOUT CARDIOVERSION: SHX5443

## 2022-04-14 LAB — BASIC METABOLIC PANEL
Anion gap: 6 (ref 5–15)
BUN: 69 mg/dL — ABNORMAL HIGH (ref 8–23)
CO2: 21 mmol/L — ABNORMAL LOW (ref 22–32)
Calcium: 9.2 mg/dL (ref 8.9–10.3)
Chloride: 109 mmol/L (ref 98–111)
Creatinine, Ser: 4.56 mg/dL — ABNORMAL HIGH (ref 0.61–1.24)
GFR, Estimated: 13 mL/min — ABNORMAL LOW (ref >60)
Glucose, Bld: 133 mg/dL — ABNORMAL HIGH (ref 70–99)
Potassium: 3.5 mmol/L (ref 3.5–5.1)
Sodium: 136 mmol/L (ref 135–145)

## 2022-04-14 LAB — GLUCOSE, CAPILLARY
Glucose-Capillary: 120 mg/dL — ABNORMAL HIGH (ref 70–99)
Glucose-Capillary: 124 mg/dL — ABNORMAL HIGH (ref 70–99)
Glucose-Capillary: 159 mg/dL — ABNORMAL HIGH (ref 70–99)

## 2022-04-14 LAB — CBC
HCT: 30.5 % — ABNORMAL LOW (ref 39.0–52.0)
Hemoglobin: 10.3 g/dL — ABNORMAL LOW (ref 13.0–17.0)
MCH: 32.3 pg (ref 26.0–34.0)
MCHC: 33.8 g/dL (ref 30.0–36.0)
MCV: 95.6 fL (ref 80.0–100.0)
Platelets: 150 10*3/uL (ref 150–400)
RBC: 3.19 MIL/uL — ABNORMAL LOW (ref 4.22–5.81)
RDW: 12.4 % (ref 11.5–15.5)
WBC: 6.3 10*3/uL (ref 4.0–10.5)
nRBC: 0 % (ref 0.0–0.2)

## 2022-04-14 LAB — ECHO TEE: MV VTI: 2.19 cm2

## 2022-04-14 SURGERY — ECHOCARDIOGRAM, TRANSESOPHAGEAL
Anesthesia: Monitor Anesthesia Care

## 2022-04-14 MED ORDER — AMIODARONE HCL 200 MG PO TABS: 200.0000 mg | ORAL_TABLET | Freq: Every morning | ORAL | Status: DC

## 2022-04-14 MED ORDER — PROPOFOL 500 MG/50ML IV EMUL
INTRAVENOUS | Status: DC | PRN
  Administered 2022-04-14: 100 ug/kg/min via INTRAVENOUS

## 2022-04-14 MED ORDER — SODIUM CHLORIDE 0.9 % IV SOLN: INTRAVENOUS | Status: DC | PRN

## 2022-04-14 NOTE — Progress Notes (Signed)
Discharge instructions given to patient and all questions were answered.  

## 2022-04-14 NOTE — Procedures (Signed)
Electrical Cardioversion Procedure Note [name] [id number] Mar 05, 1946  Procedure: Electrical Cardioversion Indications:  Atrial Fibrillation  Procedure Details Consent: Risks of procedure as well as the alternatives and risks of each were explained to the (patient/caregiver).  Consent for procedure obtained. Time Out: Verified patient identification, verified procedure, site/side was marked, verified correct patient position, special equipment/implants available, medications/allergies/relevent history reviewed, required imaging and test results available.  Performed  Patient placed on cardiac monitor, pulse oximetry, supplemental oxygen as necessary.  Sedation given:  Propofol per anesthesiology Pacer pads placed anterior and posterior chest.  Cardioverted 1 time(s).  Cardioverted at [place].  Evaluation Findings: Post procedure EKG shows: NSR Complications: None Patient did tolerate procedure well.   [name] 04/14/2022, 1:43 PM

## 2022-04-14 NOTE — Care Management Important Message (Signed)
Important Message  Patient Details IM Letter given to the Patient. Name: [name] MRN: [id number] Date of Birth: [birth year]   Medicare Important Message Given:  Yes     Kerin Salen 04/14/2022, 9:15 AM

## 2022-04-14 NOTE — Interval H&P Note (Signed)
History and Physical Interval Note:  04/14/2022 1:20 PM  [name]  has presented today for surgery, with the diagnosis of afib.  The various methods of treatment have been discussed with the patient and family. After consideration of risks, benefits and other options for treatment, the patient has consented to  Procedure(s): TRANSESOPHAGEAL ECHOCARDIOGRAM (TEE) (N/A) CARDIOVERSION (N/A) as a surgical intervention.  The patient's history has been reviewed, patient examined, no change in status, stable for surgery.  I have reviewed the patient's chart and labs.  Questions were answered to the patient's satisfaction.     Nimo Verastegui [employer]

## 2022-04-14 NOTE — Transfer of Care (Signed)
Immediate Anesthesia Transfer of Care Note  Patient: [name]  Procedure(s) Performed: TRANSESOPHAGEAL ECHOCARDIOGRAM (TEE) CARDIOVERSION  Patient Location: Endoscopy Unit  Anesthesia Type:MAC  Level of Consciousness: awake, alert  and oriented  Airway & Oxygen Therapy: Patient Spontanous Breathing and Patient connected to nasal cannula oxygen  Post-op Assessment: Report given to RN, Post -op Vital signs reviewed and stable and Patient moving all extremities X 4  Post vital signs: Reviewed and stable  Last Vitals:  Vitals Value Taken Time  BP    Temp    Pulse    Resp    SpO2      Last Pain:  Vitals:   04/14/22 1226  TempSrc: Temporal  PainSc: 0-No pain      Patients Stated Pain Goal: 3 (09/40/76 8088)  Complications: No notable events documented.

## 2022-04-14 NOTE — Anesthesia Preprocedure Evaluation (Addendum)
Anesthesia Evaluation  Patient identified by MRN, date of birth, ID band Patient awake    Reviewed: Allergy & Precautions, NPO status , Patient's Chart, lab work & pertinent test results  History of Anesthesia Complications Negative for: history of anesthetic complications  Airway Mallampati: II  TM Distance: >3 FB Neck ROM: Full    Dental  (+) Missing,    Pulmonary sleep apnea and Continuous Positive Airway Pressure Ventilation ,    Pulmonary exam normal        Cardiovascular hypertension, Pt. 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  Reproductive/Obstetrics negative OB ROS                            Anesthesia Physical Anesthesia Plan  ASA: 3  Anesthesia Plan: MAC   Post-op Pain Management: Minimal or no pain anticipated   Induction:   PONV Risk Score and Plan: 1 and Treatment may vary due to age or medical condition and Propofol infusion  Airway Management Planned: Natural Airway and Nasal  Cannula  Additional Equipment: None  Intra-op Plan:   Post-operative Plan:   Informed Consent: I have reviewed the patients History and Physical, chart, labs and discussed the procedure including the risks, benefits and alternatives for the proposed anesthesia with the patient or authorized representative who has indicated his/her understanding and acceptance.       Plan Discussed with: CRNA  Anesthesia Plan Comments:        Anesthesia Quick Evaluation

## 2022-04-14 NOTE — Discharge Summary (Signed)
Physician Discharge Summary  BRAYLEE BOSHER EXH:[id number] DOB: 1945/09/08 DOA: 04/10/2022  PCP: [name], MD  Admit date: 04/10/2022 Discharge date: 04/14/2022  Admitted From: Home Disposition: Home  Recommendations for Outpatient Follow-up:  Follow up with PCP in 1-2 weeks Please obtain BMP/CBC in one week Follow-up with urology in 2 weeks for stone extraction Follow-up with nephrology as previously scheduled   Discharge Condition: Stable CODE STATUS: Full code Diet recommendation: Heart healthy, carb modified  Brief/Interim Summary: 76 year old male with a history of chronic kidney disease stage IV, diabetes, chronic atrial fibrillation on anticoagulation, diastolic heart failure, presents to the emergency room with right flank pain.  Found to have obstructing right ureteral stone.  He has also had acute kidney injury.  Seen by urology and underwent ureteral stent placement.  Overall renal function has slowly improved.  He was also seen by cardiology and underwent TEE cardioversion.  Discharge Diagnoses:  Principal Problem:   Right ureteral stone with hydronephrosis Active Problems:   Acute renal failure superimposed on stage 4 chronic kidney disease (HCC)   Atrial fibrillation, chronic (HCC)   Diastolic CHF, chronic (HCC)   CAD, AUTOLOGOUS BYPASS GRAFT   Mixed hyperlipidemia   Obstructive sleep apnea of adult   Diabetes mellitus type II, controlled ([place])  AKI on CKD stage IV -Baseline creatinine appears to be around 4.0 -He was admitted with creatinine of 5.5 -This was likely secondary to obstructing stone -He is status post ureteral stent placement -Creatinine improved to 4.5 on discharge   Right ureteral stone with hydronephrosis -Seen by urology -Status post right ureteral stent placement -Plans are for stone extraction in 2 weeks   Chronic diastolic congestive heart failure -Currently appears to be euvolemic -Restart home dose of Lasix   Chronic  atrial fibrillation -Continued on amiodarone -Continued on Eliquis, this was not held on admission -Seen by cardiology and underwent TEE cardioversion on 8/22   Diabetes, type II -Resume semaglutide on discharge -Blood sugars remained stable in the hospital   Obstructive sleep apnea -Continue on CPAP  Discharge Instructions  Discharge Instructions     Diet - low sodium heart healthy   Complete by: As directed    Increase activity slowly   Complete by: As directed       Allergies as of 04/14/2022       Reactions   Crestor [rosuvastatin] Other (See Comments)   Muscle aches   Lipitor [atorvastatin] Other (See Comments)   Muscle aches   Pravastatin Other (See Comments)   Muscle aches   Carvedilol Other (See Comments)   loss of appetite   Metformin Diarrhea   Serotonin Reuptake Inhibitors (ssris) Other (See Comments)   Muscle aches   Zocor [simvastatin] Other (See Comments)    Muscle pain   Codeine Itching, Other (See Comments)   Extremity tingling-- can take synthetic        Medication List     TAKE these medications    allopurinol 100 MG tablet Commonly known as: ZYLOPRIM Take 50 mg by mouth 2 (two) times a week.   amiodarone 200 MG tablet Commonly known as: PACERONE Take 1 tablet (200 mg total) by mouth in the morning.   apixaban 5 MG Tabs tablet Commonly known as: Eliquis Take 1 tablet (5 mg total) by mouth 2 (two) times daily.   cetirizine 10 MG tablet Commonly known as: ZYRTEC Take 10 mg by mouth in the morning.   colchicine 0.6 MG tablet Take 0.6-1.2 mg by mouth 2 (  two) times daily as needed (gout).   cycloSPORINE 0.05 % ophthalmic emulsion Commonly known as: RESTASIS Place 1 drop into both eyes 2 (two) times daily as needed (eye irritation.).   Dexcom G7 Sensor Misc 3 each by Does not apply route every 30 (thirty) days. Apply 1 sensor every 10 days   diclofenac Sodium 1 % Gel Commonly known as: VOLTAREN Apply 2 g topically 3 (three)  times daily as needed for pain.   ezetimibe 10 MG tablet Commonly known as: ZETIA Take 1 tablet (10 mg total) by mouth daily.   fluticasone 50 MCG/ACT nasal spray Commonly known as: FLONASE Place 1 spray into both nostrils daily as needed for allergies or rhinitis.   furosemide 40 MG tablet Commonly known as: LASIX Take 40 mg by mouth See admin instructions. Take 1 tablet (40 mg) by mouth every other day alternating with 80 mg dosage   HYDROcodone-acetaminophen 5-325 MG tablet Commonly known as: NORCO/VICODIN Take 1 tablet by mouth every 6 (six) hours as needed for moderate pain or severe pain.   methocarbamol 500 MG tablet Commonly known as: ROBAXIN Take 500 mg by mouth 3 (three) times daily as needed for muscle spasms.   multivitamin capsule Take 1 capsule by mouth in the morning.   nitroGLYCERIN 0.4 MG SL tablet Commonly known as: NITROSTAT 1 TAB UNDER THE TONGUE EVERY 5 MINUTES AS NEEDED FOR CHEST PAIN UP TO 3 DOSES, IF PERSIST CALL 911   OneTouch Verio test strip Generic drug: glucose blood 1 each daily.   Ozempic (0.25 or 0.5 MG/DOSE) 2 MG/1.5ML Sopn Generic drug: Semaglutide(0.25 or 0.'5MG'$ /DOS) Inject 0.5 mg into the skin once a week.   pantoprazole 40 MG tablet Commonly known as: PROTONIX Take 40 mg by mouth daily as needed (heartburn).   Praluent 75 MG/ML Soaj Generic drug: Alirocumab Inject 75 mg into the skin every 14 (fourteen) days.   Testosterone 20.25 MG/1.25GM (1.62%) Gel Place 2 Pump onto the skin daily. Applied to chest   traZODone 100 MG tablet Commonly known as: DESYREL Take 100 mg by mouth at bedtime.   Vitamin B-12 5000 MCG Tbdp Take 5,000 mcg by mouth 2 (two) times a week.        Follow-up Information     Ceasar Mons, MD. Schedule an appointment as soon as possible for a visit in 2 week(s).   Specialty: Urology Contact information: [place] [address] [phone]         Justin Mend, MD Follow up.   Specialty: Internal Medicine Why: as scheduled Contact information: [place] [address] [phone]                Allergies  Allergen Reactions   Crestor [Rosuvastatin] Other (See Comments)    Muscle aches    Lipitor [Atorvastatin] Other (See Comments)    Muscle aches   Pravastatin Other (See Comments)    Muscle aches   Carvedilol Other (See Comments)    loss of appetite   Metformin Diarrhea   Serotonin Reuptake Inhibitors (Ssris) Other (See Comments)    Muscle aches   Zocor [Simvastatin] Other (See Comments)     Muscle pain   Codeine Itching and Other (See Comments)    Extremity tingling-- can take synthetic    Consultations: Cardiology Urology   Procedures/Studies: ECHO TEE  Result Date: 04/14/2022    TRANSESOPHOGEAL ECHO REPORT   Patient Name:   [name] [facility] Date of Exam:  04/14/2022 Medical Rec #:  [id number]      Height:       69.0 in Accession #:    [id number]     Weight:       199.3 lb Date of Birth:  November 18, 1945      BSA:          2.063 m Patient Age:    76 years       BP:           162/74 mmHg Patient Gender: M              HR:           84 bpm. Exam Location:  Inpatient Procedure: Transesophageal Echo, Color Doppler, Cardiac Doppler and 3D Echo Indications:     I48.91* Unspecified atrial fibrillation  History:         Patient has prior history of Echocardiogram examinations, most                  recent 04/11/2021. CHF, CAD, Arrythmias:Atrial Fibrillation;                  Risk Factors:Hypertension, Diabetes, Dyslipidemia and Sleep                  Apnea. 03/25/16 MV Repair with 4m ring, anterior commisural                  plication, and 4 neochords.  Sonographer:     ERaquel SarnaSenior RDCS Referring Phys:  6[place] des MortsDiagnosing Phys: DFranki MontePROCEDURE: After discussion of the risks and benefits of a TEE, an informed consent was obtained from the patient. The transesophogeal probe was passed without  difficulty through the esophogus of the patient. Sedation performed by different physician. The patient was monitored while under deep sedation. Anesthestetic sedation was provided intravenously by Anesthesiology: '271mg'$  of Propofol. The patient developed no complications during the procedure. A successful direct current cardioversion was performed at 200 joules with 1 attempt. IMPRESSIONS  1. Left ventricular ejection fraction, by estimation, is 55%. The left ventricle has normal function. The left ventricle has no regional wall motion abnormalities.  2. Right ventricular systolic function is normal. The right ventricular size is mildly enlarged. Tricuspid regurgitation signal is inadequate for assessing PA pressure.  3. Left atrial size was moderately dilated. No left atrial/left atrial appendage thrombus was detected.  4. Right atrial size was moderately dilated.  5. Status post mitral valve repair. Mean gradient 10 mmHg across the mitral valve, MVA 2.19 cm^2. The leaflets open well, the valve looks like it was repaired tightly. Trivial regurgitation.  6. The aortic valve is tricuspid. There is mild calcification of the aortic valve. Aortic valve regurgitation is not visualized. No aortic stenosis is present.  7. PFO noted by color doppler. FINDINGS  Left Ventricle: Left ventricular ejection fraction, by estimation, is 55%. The left ventricle has normal function. The left ventricle has no regional wall motion abnormalities. The left ventricular internal cavity size was normal in size. Right Ventricle: The right ventricular size is mildly enlarged. No increase in right ventricular wall thickness. Right ventricular systolic function is normal. Tricuspid regurgitation signal is inadequate for assessing PA pressure. Left Atrium: Left atrial size was moderately dilated. No left atrial/left atrial appendage thrombus was detected. Right Atrium: Right atrial size was moderately dilated. Pericardium: There is no evidence  of pericardial effusion. Mitral Valve: Status post mitral valve repair. Mean gradient 10 mmHg across the  mitral valve, MVA 2.19 cm^2. The leaflets open well, the valve looks like it was repaired tightly. Trivial regurgitation. The mitral valve has been repaired/replaced. Trivial  mitral valve regurgitation. MV peak gradient, 19.0 mmHg. The mean mitral valve gradient is 10.0 mmHg. Tricuspid Valve: The tricuspid valve is normal in structure. Tricuspid valve regurgitation is mild. Aortic Valve: The aortic valve is tricuspid. There is mild calcification of the aortic valve. Aortic valve regurgitation is not visualized. No aortic stenosis is present. Pulmonic Valve: The pulmonic valve was normal in structure. Pulmonic valve regurgitation is not visualized. Aorta: The aortic root is normal in size and structure. IAS/Shunts: PFO noted by color doppler.  LEFT VENTRICLE PLAX 2D LVOT diam:     2.50 cm LV SV:         105 LV SV Index:   51 LVOT Area:     4.91 cm  AORTIC VALVE LVOT Vmax:   125.00 cm/s LVOT Vmean:  77.600 cm/s LVOT VTI:    0.213 m MITRAL VALVE MV Area VTI:  2.19 cm    SHUNTS MV Peak grad: 19.0 mmHg   Systemic VTI:  0.21 m MV Mean grad: 10.0 mmHg   Systemic Diam: 2.50 cm MV Vmax:      2.18 m/s MV Vmean:     148.0 cm/s Dalton McleanMD Electronically signed by Franki Monte Signature Date/Time: 04/14/2022/3:39:53 PM    Final    DG C-Arm 1-60 Min-No Report  Result Date: 04/11/2022 Fluoroscopy was utilized by the requesting physician.  No radiographic interpretation.   CT ABDOMEN PELVIS WO CONTRAST  Result Date: 04/10/2022 CLINICAL DATA:  RLQ abdominal pain (Age >= 14y) rlq abd pain, acute renal failure EXAM: CT ABDOMEN AND PELVIS WITHOUT CONTRAST TECHNIQUE: Multidetector CT imaging of the abdomen and pelvis was performed following the standard protocol without IV contrast. RADIATION DOSE REDUCTION: This exam was performed according to the departmental dose-optimization program which includes automated  exposure control, adjustment of the mA and/or kV according to patient size and/or use of iterative reconstruction technique. COMPARISON:  01/07/2021 FINDINGS: Lower chest: No acute abnormality. Coronary artery and aortic calcifications. Calcified granuloma in the right lower lung. Hepatobiliary: Small layering gallstones in the gallbladder. No biliary ductal dilatation or focal hepatic abnormality. Pancreas: No focal abnormality or ductal dilatation. Spleen: No focal abnormality.  Normal size. Adrenals/Urinary Tract: 3 mm right midpole renal stone. Mild right hydronephrosis due to 2 8 mm mid right ureteral stone. No hydronephrosis or stones on the left. Adrenal glands and urinary bladder unremarkable. Stomach/Bowel: Colonic diverticulosis. No active diverticulitis. Normal appendix. Stomach and small bowel unremarkable. Lap band device in place, grossly unremarkable. Vascular/Lymphatic: Aortic atherosclerosis. No evidence of aneurysm or adenopathy. Reproductive: No visible focal abnormality. Other: No free fluid or free air. Musculoskeletal: No acute bony abnormality. IMPRESSION: 8 mm mid right ureteral stone with mild right hydronephrosis. Right nephrolithiasis. Cholelithiasis. Aortic atherosclerosis. Colonic diverticulosis. Electronically Signed   By: Rolm Baptise M.D.   On: 04/10/2022 15:03      Subjective: Feels good, no abd pain, no shortness of breath  Discharge Exam: Vitals:   04/14/22 1226 04/14/22 1354 04/14/22 1404 04/14/22 1453  BP: (!) 162/89 (!) 144/67 (!) 164/73 (!) 149/79  Pulse: 70 67 63 64  Resp: '11 13 14 18  '$ Temp: 97.9 F (36.6 C) 98 F (36.7 C)    TempSrc: Temporal Oral    SpO2: 98% 99% 99% 100%  Weight:      Height:  General: Pt is alert, awake, not in acute distress Cardiovascular: RRR, S1/S2 +, no rubs, no gallops Respiratory: CTA bilaterally, no wheezing, no rhonchi Abdominal: Soft, NT, ND, bowel sounds + Extremities: no edema, no cyanosis    The results of  significant diagnostics from this hospitalization (including imaging, microbiology, ancillary and laboratory) are listed below for reference.     Microbiology: No results found for this or any previous visit (from the past 240 hour(s)).   Labs: BNP (last 3 results) No results for input(s): "BNP" in the last 8760 hours. Basic Metabolic Panel: Recent Labs  Lab 04/10/22 1240 04/11/22 0430 04/12/22 0430 04/13/22 0430 04/14/22 0440  NA 133* 133* 132* 136 136  K 4.3 3.9 4.6 3.9 3.5  CL 99 105 105 106 109  CO2 21* 20* 19* 21* 21*  GLUCOSE 141* 116* 215* 140* 133*  BUN 59* 59* 70* 75* 69*  CREATININE 5.58* 5.47* 5.32* 5.04* 4.56*  CALCIUM 10.0 9.1 9.4 9.2 9.2   Liver Function Tests: Recent Labs  Lab 04/08/22 1430 04/10/22 1240  AST 16 15  ALT 13 12  ALKPHOS 109 109  BILITOT 0.6 0.8  PROT 6.4* 7.2  ALBUMIN 4.0 4.6   Recent Labs  Lab 04/10/22 1240  LIPASE 42   No results for input(s): "AMMONIA" in the last 168 hours. CBC: Recent Labs  Lab 04/08/22 1430 04/10/22 1240 04/11/22 0430 04/12/22 0430 04/14/22 0440  WBC 6.4 12.5* 9.6 5.3 6.3  HGB 12.9* 13.0 11.4* 10.9* 10.3*  HCT 37.7* 38.5* 34.7* 31.7* 30.5*  MCV 95.0 95.1 99.7 95.8 95.6  PLT 148* 155 123* 131* 150   Cardiac Enzymes: No results for input(s): "CKTOTAL", "CKMB", "CKMBINDEX", "TROPONINI" in the last 168 hours. BNP: Invalid input(s): "POCBNP" CBG: Recent Labs  Lab 04/13/22 0741 04/13/22 1140 04/13/22 1630 04/14/22 0743 04/14/22 1242  GLUCAP 133* 136* 123* 120* 124*   D-Dimer No results for input(s): "DDIMER" in the last 72 hours. Hgb A1c No results for input(s): "HGBA1C" in the last 72 hours. Lipid Profile No results for input(s): "CHOL", "HDL", "LDLCALC", "TRIG", "CHOLHDL", "LDLDIRECT" in the last 72 hours. Thyroid function studies No results for input(s): "TSH", "T4TOTAL", "T3FREE", "THYROIDAB" in the last 72 hours.  Invalid input(s): "FREET3" Anemia work up No results for input(s):  "VITAMINB12", "FOLATE", "FERRITIN", "TIBC", "IRON", "RETICCTPCT" in the last 72 hours. Urinalysis    Component Value Date/Time   COLORURINE YELLOW 04/10/2022 1240   APPEARANCEUR CLEAR 04/10/2022 1240   LABSPEC 1.013 04/10/2022 1240   PHURINE 5.5 04/10/2022 1240   GLUCOSEU NEGATIVE 04/10/2022 1240   HGBUR MODERATE (A) 04/10/2022 1240   BILIRUBINUR NEGATIVE 04/10/2022 1240   KETONESUR NEGATIVE 04/10/2022 1240   PROTEINUR 30 (A) 04/10/2022 1240   UROBILINOGEN 0.2 04/04/2015 1529   NITRITE NEGATIVE 04/10/2022 1240   LEUKOCYTESUR NEGATIVE 04/10/2022 1240   Sepsis Labs Recent Labs  Lab 04/10/22 1240 04/11/22 0430 04/12/22 0430 04/14/22 0440  WBC 12.5* 9.6 5.3 6.3   Microbiology No results found for this or any previous visit (from the past 240 hour(s)).   Time coordinating discharge: 59mns  SIGNED:   JKathie Dike MD  [facility] 04/14/2022, 4:46 PM   If 7PM-7AM, please contact night-coverage [email or web address]

## 2022-04-14 NOTE — CV Procedure (Signed)
Procedure: TEE  Sedation: Per anesthesiology  Indication: Atrial fibrillation  Findings: Please see echo section for full report.  No LA appendage thrombus so proceeded with DCCV.  [name] 04/14/2022 1:42 PM

## 2022-04-14 NOTE — Progress Notes (Signed)
   04/14/22 1005  Mobility  Activity Refused mobility   Mobility Specialist Cancellation/Refusal Note:   Reason for Cancellation/Refusal: Pt declined mobility at this time. Pt napping. Will check back as schedule permits.    [facility]

## 2022-04-14 NOTE — Anesthesia Postprocedure Evaluation (Signed)
Anesthesia Post Note  Patient: [name]  Procedure(s) Performed: TRANSESOPHAGEAL ECHOCARDIOGRAM (TEE) CARDIOVERSION     Patient location during evaluation: PACU Anesthesia Type: MAC Level of consciousness: awake and alert Pain management: pain level controlled Vital Signs Assessment: post-procedure vital signs reviewed and stable Respiratory status: spontaneous breathing, nonlabored ventilation and respiratory function stable Cardiovascular status: blood pressure returned to baseline Postop Assessment: no apparent nausea or vomiting Anesthetic complications: no   No notable events documented.  Last Vitals:  Vitals:   04/14/22 1404 04/14/22 1453  BP: (!) 164/73 (!) 149/79  Pulse: 63 64  Resp: 14 18  Temp:    SpO2: 99% 100%    Last Pain:  Vitals:   04/14/22 1404  TempSrc:   PainSc: 0-No pain                 Marthenia Rolling

## 2022-04-14 NOTE — Anesthesia Procedure Notes (Signed)
Procedure Name: MAC Date/Time: 04/14/2022 1:24 PM  Performed by: Kyung Rudd, CRNAPre-anesthesia Checklist: Patient identified, Emergency Drugs available, Suction available, Patient being monitored and Timeout performed Patient Re-evaluated:Patient Re-evaluated prior to induction Oxygen Delivery Method: Nasal cannula Induction Type: IV induction Placement Confirmation: positive ETCO2 Dental Injury: Teeth and Oropharynx as per pre-operative assessment

## 2022-04-14 NOTE — Interval H&P Note (Signed)
History and Physical Interval Note:  04/14/2022 1:20 PM  [name]  has presented today for surgery, with the diagnosis of afib.  The various methods of treatment have been discussed with the patient and family. After consideration of risks, benefits and other options for treatment, the patient has consented to  Procedure(s): TRANSESOPHAGEAL ECHOCARDIOGRAM (TEE) (N/A) CARDIOVERSION (N/A) as a surgical intervention.  The patient's history has been reviewed, patient examined, no change in status, stable for surgery.  I have reviewed the patient's chart and labs.  Questions were answered to the patient's satisfaction.     Mireille Lacombe [employer]

## 2022-04-15 ENCOUNTER — Telehealth: Payer: Self-pay

## 2022-04-15 ENCOUNTER — Encounter (HOSPITAL_COMMUNITY): Payer: Self-pay | Admitting: Cardiology

## 2022-04-15 NOTE — Telephone Encounter (Signed)
Call to pt as he has missed a few PREP classes Pt explained just out of hospital. Doing okay will need continued procedure for kidney stone Will be another 2 wks before he can come back to class.  Asked that he keep me posted.

## 2022-04-15 NOTE — Patient Outreach (Signed)
  Care Coordination TOC Note Transition Care Management Follow-up Telephone Call Date of discharge and from where: [name] 04/10/22-04/14/22 How have you been since you were released from the hospital? Per patient's spouse, [name], he is doing ok, sleeping a lot and he still has bleeding from his ureteral stent. Any questions or concerns? Yes- Spouse concerned she has not heard from Dr. Gilford Rile office and patient still has bleeding from the stent.  This RNCM contacted Dr. Gilford Rile office (urology) at [phone] and spoke with Caryl Pina, clinic nurse.  Caryl Pina stated she would call patients wife back and verified phone number.  [name] requested a call back letting her know this Probation officer had spoken with the nurse Caryl Pina and she will be calling them.  Items Reviewed: Did the pt receive and understand the discharge instructions provided? Yes  Medications obtained and verified? Yes  Other? No  Any new allergies since your discharge? No  Dietary orders reviewed? Yes Do you have support at home? Yes   Home Care and Equipment/Supplies: Were home health services ordered? no If so, what is the name of the agency? N/A  Has the agency set up a time to come to the patient's home? not applicable Were any new equipment or medical supplies ordered?  No What is the name of the medical supply agency? N/A Were you able to get the supplies/equipment? no Do you have any questions related to the use of the equipment or supplies? No  Functional Questionnaire: (I = Independent and D = Dependent) ADLs: I  Bathing/Dressing- I  Meal Prep- I  Eating- I  Maintaining continence- I  Transferring/Ambulation- I  Managing Meds- I  Follow up appointments reviewed:  PCP Hospital f/u appt confirmed? No   Specialist Hospital f/u appt confirmed? No  -Urology office contacted Are transportation arrangements needed? No  If their condition worsens, is the pt aware to call PCP or go to the Emergency Dept.? Yes Was the  patient provided with contact information for the PCP's office or ED? Yes Was to pt encouraged to call back with questions or concerns? Yes  SDOH assessments and interventions completed:   Yes  Care Coordination Interventions Activated:  Yes   Care Coordination Interventions:   Specialist and PCP appt. requested     Encounter Outcome:  Pt. Visit Completed

## 2022-04-20 ENCOUNTER — Telehealth: Payer: Self-pay | Admitting: Interventional Cardiology

## 2022-04-20 NOTE — Telephone Encounter (Signed)
   Pre-operative Risk Assessment    Patient Name: [name]  DOB: Nov 20, 1945 MRN: [id number]      Request for Surgical Clearance    Procedure:   Cystoscopy with Ureteroscopy Laser Lithotripsy Stint Exchange  Date of Surgery:  Clearance TBD                                 Surgeon:  Dr. [name] Surgeon's Group or Practice Name:  [facility] Phone number:  [phone] Ex. [phone] Fax number:  [phone]   Type of Clearance Requested:   - Medical  - Pharmacy:  Hold Apixaban (Eliquis) 2-3 days prior   Type of Anesthesia:  General    Additional requests/questions:  Please fax a copy of medical clearance to the surgeon's office.  Romilda Garret   04/20/2022, 4:26 PM

## 2022-04-21 ENCOUNTER — Ambulatory Visit (HOSPITAL_COMMUNITY)
Admission: RE | Admit: 2022-04-21 | Discharge: 2022-04-21 | Disposition: A | Discharge status: Home / Self Care | Payer: Medicare Other | Source: Ambulatory Visit | Attending: Cardiology | Admitting: Cardiology

## 2022-04-21 ENCOUNTER — Encounter (HOSPITAL_COMMUNITY): Payer: Self-pay | Admitting: Cardiology

## 2022-04-21 VITALS — BP 120/70 | HR 58 | Wt 197.4 lb

## 2022-04-21 DIAGNOSIS — I770 Arteriovenous fistula, acquired: Secondary | ICD-10-CM | POA: Insufficient documentation

## 2022-04-21 DIAGNOSIS — Z8616 Personal history of COVID-19: Secondary | ICD-10-CM | POA: Insufficient documentation

## 2022-04-21 DIAGNOSIS — I471 Supraventricular tachycardia: Secondary | ICD-10-CM | POA: Diagnosis not present

## 2022-04-21 DIAGNOSIS — E669 Obesity, unspecified: Secondary | ICD-10-CM | POA: Insufficient documentation

## 2022-04-21 DIAGNOSIS — Z8673 Personal history of transient ischemic attack (TIA), and cerebral infarction without residual deficits: Secondary | ICD-10-CM | POA: Diagnosis not present

## 2022-04-21 DIAGNOSIS — I2510 Atherosclerotic heart disease of native coronary artery without angina pectoris: Secondary | ICD-10-CM | POA: Insufficient documentation

## 2022-04-21 DIAGNOSIS — Z79899 Other long term (current) drug therapy: Secondary | ICD-10-CM | POA: Diagnosis not present

## 2022-04-21 DIAGNOSIS — I130 Hypertensive heart and chronic kidney disease with heart failure and stage 1 through stage 4 chronic kidney disease, or unspecified chronic kidney disease: Secondary | ICD-10-CM | POA: Diagnosis not present

## 2022-04-21 DIAGNOSIS — R0789 Other chest pain: Secondary | ICD-10-CM | POA: Diagnosis not present

## 2022-04-21 DIAGNOSIS — I5022 Chronic systolic (congestive) heart failure: Secondary | ICD-10-CM

## 2022-04-21 DIAGNOSIS — I480 Paroxysmal atrial fibrillation: Secondary | ICD-10-CM

## 2022-04-21 DIAGNOSIS — Z7901 Long term (current) use of anticoagulants: Secondary | ICD-10-CM | POA: Insufficient documentation

## 2022-04-21 DIAGNOSIS — G4733 Obstructive sleep apnea (adult) (pediatric): Secondary | ICD-10-CM | POA: Diagnosis not present

## 2022-04-21 DIAGNOSIS — E785 Hyperlipidemia, unspecified: Secondary | ICD-10-CM | POA: Diagnosis not present

## 2022-04-21 DIAGNOSIS — I252 Old myocardial infarction: Secondary | ICD-10-CM | POA: Insufficient documentation

## 2022-04-21 DIAGNOSIS — I052 Rheumatic mitral stenosis with insufficiency: Secondary | ICD-10-CM | POA: Insufficient documentation

## 2022-04-21 DIAGNOSIS — I482 Chronic atrial fibrillation, unspecified: Secondary | ICD-10-CM

## 2022-04-21 DIAGNOSIS — Z8774 Personal history of (corrected) congenital malformations of heart and circulatory system: Secondary | ICD-10-CM | POA: Diagnosis not present

## 2022-04-21 DIAGNOSIS — E1122 Type 2 diabetes mellitus with diabetic chronic kidney disease: Secondary | ICD-10-CM | POA: Diagnosis not present

## 2022-04-21 DIAGNOSIS — N184 Chronic kidney disease, stage 4 (severe): Secondary | ICD-10-CM | POA: Diagnosis not present

## 2022-04-21 DIAGNOSIS — E782 Mixed hyperlipidemia: Secondary | ICD-10-CM | POA: Diagnosis not present

## 2022-04-21 LAB — LIPID PANEL
Cholesterol: 94 mg/dL (ref 0–200)
HDL: 50 mg/dL (ref >40)
LDL Cholesterol: 30 mg/dL (ref 0–99)
Total CHOL/HDL Ratio: 1.9 RATIO
Triglycerides: 69 mg/dL (ref <150)
VLDL: 14 mg/dL (ref 0–40)

## 2022-04-21 LAB — COMPREHENSIVE METABOLIC PANEL
ALT: 12 U/L (ref 0–44)
AST: 16 U/L (ref 15–41)
Albumin: 3.7 g/dL (ref 3.5–5.0)
Alkaline Phosphatase: 104 U/L (ref 38–126)
Anion gap: 7 (ref 5–15)
BUN: 40 mg/dL — ABNORMAL HIGH (ref 8–23)
CO2: 27 mmol/L (ref 22–32)
Calcium: 10.1 mg/dL (ref 8.9–10.3)
Chloride: 105 mmol/L (ref 98–111)
Creatinine, Ser: 4.1 mg/dL — ABNORMAL HIGH (ref 0.61–1.24)
GFR, Estimated: 14 mL/min — ABNORMAL LOW (ref >60)
Glucose, Bld: 139 mg/dL — ABNORMAL HIGH (ref 70–99)
Potassium: 4.6 mmol/L (ref 3.5–5.1)
Sodium: 139 mmol/L (ref 135–145)
Total Bilirubin: 0.7 mg/dL (ref 0.3–1.2)
Total Protein: 6.2 g/dL — ABNORMAL LOW (ref 6.5–8.1)

## 2022-04-21 LAB — TSH: TSH: 2.635 u[IU]/mL (ref 0.350–4.500)

## 2022-04-21 NOTE — Patient Instructions (Signed)
There has been no changes to your medications.  Labs done today, your results will be available in MyChart, we will contact you for abnormal readings.  You have been referred to the A. Fib clinic. They will call you to arrange the appointment.  You have be referred back to the [facility] PREP class. They will contact you to arrange the appointment.  Your physician recommends that you schedule a follow-up appointment in: 6 months ( February 2024)  ** please call the office in November to arrange your follow up appointment **  If you have any questions or concerns before your next appointment please send [place] a message through [place] or call our office at [phone].    TO LEAVE A MESSAGE FOR THE NURSE SELECT OPTION 2, PLEASE LEAVE A MESSAGE INCLUDING: YOUR NAME DATE OF BIRTH CALL BACK NUMBER REASON FOR CALL**this is important as we prioritize the call backs  YOU WILL RECEIVE A CALL BACK THE SAME DAY AS LONG AS YOU CALL BEFORE 4:00 PM  At the [facility], you and your health needs are our priority. As part of our continuing mission to provide you with exceptional heart care, we have created designated Provider Care Teams. These Care Teams include your primary Cardiologist (physician) and Advanced Practice Providers (APPs- Physician Assistants and Nurse Practitioners) who all work together to provide you with the care you need, when you need it.   You may see any of the following providers on your designated Care Team at your next follow up: Dr Glori Bickers Dr [name] Dr. Roxana Hires, NP Lyda Jester, [place] [facility] [place], [place] Forestine Na, NP Audry Riles, PharmD   Please be sure to bring in all your medications bottles to every appointment.

## 2022-04-21 NOTE — Telephone Encounter (Signed)
Will route to PharmD for rec's re: holding anticoagulation.  Then will need to ask Dr. Aundra Dubin if pt cleared based upon recent OV (04/21/22).  Richardson Dopp, PA-C    04/21/2022 2:59 PM

## 2022-04-21 NOTE — Progress Notes (Signed)
Patient ID: KAYLA WEEKES, male   DOB: 1946/04/17, 76 y.o.   MRN: [id number] PCP: Dr Harrington Challenger Cardiology: Dr. Aundra Dubin  76 y.o. with history of CAD s/p CABG and obesity s/p lap banding procedure presents for cardiology followup.  Given exertional dyspnea and chest tightness, he had LHC in 1/15.  This showed no change from prior.  Grafts were patent and there was severe stenosis in distal D1, not amenable to PCI.   In 2017, he developed increased exertional dyspnea.  Eventually had a TEE showing severe MR with prolapse of a portion of the anterior mitral valve leaflet.  Coronary angiography in 6/17 showed patent grafts and 90% stenosis of distal diagonal not amenable to intervention.  In 8/17, he had mitral valve repair.  Post-op diastolic CHF, Lasix started and increased.   He had a cath again in 9/18 showing stable coronary anatomy.    He developed atrial arrhythmias in 12/18.  Both fibrillation and flutter were seen.  Saw Dr. Rayann Heman, recommended DCCV with amiodarone or sotalol if fibrillation recurs.  He had DCCV in 1/19.   In 6/19, he was noted to have developed AKI. Creatinine went up to 3.46.  Renal US was done, showing normal kidneys. Cause of AKI was not clear.  We stopped his Lasix. He saw nephrology and eventually went back on Lasix, currently taking 80 mg daily.  He is not using any NSAIDs.    He stopped pravastatin due to myalgias. We struggled to get him on a PCSK9 inhibitor through the [place] but he was finally able to get Praluent.    I had him do a Lexiscan Cardiolite in 11/19, which showed old inferior MI with no ischemia.  Echo in 11/19 showed EF 55-60%, s/p MV repair with mean gradient 6 mmHg. Echo 12/20 showed EF 50-55%, mildly decreased RV systolic function, repaired mitral valve with mild MR, mean gradient 6 mmHg across MV.    He had DCCV from atrial fibrillation to NSR in 1/21 and again in 2/21.   At last appointment, he reported significant fatigue and dyspnea in setting of weight  loss with low BP and HR.  I stopped his Coreg. He felt better with that change.    In 9/21, he had COVID-19 infection but was not hospitalized.   Zio patch in 3/22 showed frequent short atrial tachycardia episodes.  He saw Dr. Rayann Heman, medical therapy recommended (on amiodarone).   He was admitted in 5/22 with AKI from obstruction of right ureter by stone, he was treated by urology.   He was admitted in 6/22 with right-sided weakness and was found to have left cerebral white matter CVA.  He had been compliant with Eliquis, ASA 81 was added.   He was admitted in 7/22 with Enterococcal bacteremia.  TEE showed oscillating density attached to the mitral annulus, ?suture.  However, could not rule out vegetation so had 6 wks of IV abx for SBE.   Echo in 8/22 showed EF 55-60%, moderate focal basal septal hypertrophy, normal RV, severe LAE, s/p mitral valve repair with mean gradient 8 mmHg (mild to moderate mitral stenosis) and mild MR, large PFO, normal IVC.   Patient was noted to re-develop atrial fibrillation.  TEE was done in 8/23, LV EF was 55% with mild RV enlargement and normal RV function, s/p MV repair with mean gradient 10 mmHg but MVA 2.19 cm^2 (leaflets open well), trivial MR.  He was cardioverted back to NSR.   Today he returns for HF follow up.  He is in NSR today.  Weight down 3 lbs.  He feels better in NSR, no dyspnea with usual ADLs.  He has had a chronic band of tightness around his chest and upper back, this has been constant for months and is not associated with exertion.  Of note, patient has had trouble recently with nephrolithiasis.  He needs a cystoscopy.   ECG (personally reviewed): NSR, 1st degree AVB, IVCD 122 msec  Labs (11/12): LDL 70 Labs (5/13): K 4.1, creatinine 1.1, BNP 48 Labs (12/13): K 3.4, creatinine 1.07, LDL 59, HDL 37 Labs (1/15): K 4.3, creatinine 1.2, HCT 40.3 Labs (3/15): LDL 38, HDL 81, BNP 55, K 4, creatinine 1.2 Labs (6/15): K 4.2, creatinine 1.4, LFTs  normal, LDL 66, HDL 35 Labs (8/16): K 4, creatinine 1.6, BNP 64 Labs (12/16): LDL 61, HDL 37 Labs (4/17): K 4.2, creatinine 1.35, HCT 38.7, BNP 80 Labs (8/17): K 4.2, creatinine 1.4 => 1.66, BNP 159 Labs (12/17): K 3.6, creatinine 1.75 Labs (2/18): LDL 76, HDL 36 Labs (9/18): K 3.9, creatinine 1.76 Labs (1/19): K 4.9, creatinine 1.76, hgb 14 Labs (6/19): K 3.6, creatinine 3.46 Labs (7/19): K 4.1, creatinine 3.66, hgb 12.7 Labs (10/19): LDL 177, hgb 13.4, troponin negative, K 4, creatinine 4.15 Labs (12/19): K 4.3, creatinine 3.88 Labs (4/20): K 3.6, creatinine 3.39 Labs (11/20): LDL 73, HDL 38 Labs (2/21): K 3.4, creatinine 3.8 Labs (7/21): K 3.7, creatinine 3.66 Labs (6/22): K 4, creatinine 2.88, LDL 50 Labs (8/22): K 3.6, creatinine 2.79 Labs (1/23): K 4.3, creatinine 3.21 Labs (4/23): LDL 100 Labs (8/23): K 3.5, creatinine 4.56, hgb 10.3  PMH: 1. OSA: on CPAP.  2. Diabetes mellitus 3. Allergic rhinitis 4. CAD: s/p CABG 1995.  LHC (10/11) with 90% distal D1 (patent proximal D1 stent), total occlusion CFX, total occlusion RCA, no significant stenoses in LAD, SVG-PDA patent, SVG-OM patent, EF 55%.  Medical management.  LHC (5/13) with patent grafts and 90% distal D1 stenosis (no significant change from 10/11).  LHC (1/15) with patent grafts, 75% ISR in D1 stent, 90% stenosis distal D1 (small caliber). D1 not amenable to intervention.  - Coronary angiography (6/17): grafts patent, 90% stenosis distally in large diagonal not amenable to intervention.   - LHC (9/18): SVG-OM and SVG-PDA patent,  75% in-stent restenosis in proximal diagonal then 90% stenosis distally (small in caliber at that point), totally occluded LCx and RCA.  Left coronary system similar to prior caths, D1 may be source of angina.  - Lexiscan Cardiolite (11/19): EF 47%, old inferior MI, no ischemia.  5. Obesity: lap-banding in 5/11.  6. Chronic diastolic CHF: Echo (5/57) with EF 55-60%, moderate LVH, moderate  diastolic dysfunction, s/p mitral valve repair with elevated mean gradient but normal pressure half-time.  - Echo (8/18): EF 55-60%, s/p MV repair with mean gradient 6 mmHg, PASP 28 mmHg, mildly decreased RV systolic function.  - Echo (11/19): EF 55-60%, s/p MV repair with mean gradient 6, PASP 42 mmHg, mildly dilated RV, dilated IVC.  - Echo (12/20): EF 50-55%, mildly decreased RV systolic function. S/p MV repair with mean gradient 6 mmHg, mild MR.  - Echo (2/22): EF 45-50%, mild LVH, moderate RV enlargement with low normal systolic function, s/p mitral valve repair with mean gradient 5 mmHg and trivial MR, mild-moderate AS.  - Echo (8/22): EF 55-60%, moderate focal basal septal hypertrophy, normal RV, severe LAE, s/p mitral valve repair with mean gradient 8 mmHg (mild to moderate mitral stenosis) and mild MR,  large PFO, normal IVC. - TEE (8/23): LV EF was 55% with mild RV enlargement and normal RV function, s/p MV repair with mean gradient 10 mmHg but MVA 2.19 cm^2 (leaflets open well), trivial MR. 7. PFO: noted on 8/22 echo.  8. OA: Knees, C-spine. TKR 9/13.  9. HTN 10. Hyperlipidemia: Myalgias with atorvastatin and Crestor. Now on Praluent.  11. Diverticulosis 12. Mitral regurgitation: TEE (6/17) with EF 55-60%, severe eccentric MR, prolapse of a portion of the anterior leaflet, peak RV-RA gradient 45 mmHg.  - Mitral valve repair 8/17 - Echo in 8/22 with repaired MV, mean gradient 8 mmHg (mild-moderate MS), mild MR.  - TEE in 8/23 with MV repair with mean gradient 10 mmHg but MVA 2.19 cm^2 (leaflets open well), trivial MR. 13. CKD: Stage III.  14. Atrial fibrillation/atrial flutter: Both arrhythmias noted in 12/18.  DCCV 12/19.   - DCCV to NSR 1/21.  - DCCV to NSR 2/21.  - DCCV to NSR in 8/23 15. AKI 6/19 => CKD stage 4: uncertain etiology.  Renal US was normal.  16. Gout 17. COVID-19 infection 9/21 18. 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  - Carotid  dopplers (6/22) with mild disease only.  19. Nephrolithiasis.  20. Atrial tachycardia: Noted on 3/22 Zio patch.  21. Aortic stenosis: mild-moderate on 2/22 echo but no significant stenosis on 8/22 echo.  - No AS on TEE in 8/23.  22. 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  TEE 7/22 with oscillating density attached to the mitral annulus, ?suture.  However, could not rule out vegetation so had 6 wks of IV abx for SBE.  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  Lives in [place].   Family History  Problem Relation Age of Onset   Other Mother        joint problems   Alzheimer's disease Mother    Hypertension Father    Heart disease Father    CVA Father        age 59   Depression Father    Heart disease Sister        CABG   Stroke Sister    Heart failure Sister        congestive   Diabetes Sister    Heart disease Brother    Hypertension Brother    Congestive Heart Failure Brother     ROS: All systems reviewed and negative except as per HPI.    Current Outpatient Medications  Medication Sig Dispense Refill   Alirocumab (PRALUENT) 75 MG/ML SOAJ Inject 75 mg into the skin every 14 (fourteen) days.     allopurinol (ZYLOPRIM) 100 MG tablet Take 50 mg by mouth 2 (two) times a week.     amiodarone (PACERONE) 200 MG tablet Take 1 tablet (200 mg total) by mouth in the morning.     apixaban (ELIQUIS) 5 MG TABS tablet Take 1 tablet (5 mg total) by mouth 2 (two) times daily. 180 tablet 3   cetirizine (ZYRTEC) 10 MG tablet Take 10 mg by mouth in the morning.     colchicine 0.6 MG tablet Take 0.6-1.2 mg by mouth 2 (two) times daily as needed (gout).      Continuous Blood Gluc Sensor (DEXCOM G7 SENSOR) MISC 3 each by Does not apply route every 30 (thirty) days. Apply 1 sensor every 10 days 9 each 4   Cyanocobalamin (VITAMIN B-12) 5000 MCG TBDP Take 5,000 mcg by mouth 2 (two) times a week.  cycloSPORINE (RESTASIS) 0.05 % ophthalmic emulsion Place 1 drop into both eyes 2  (two) times daily as needed (eye irritation.).     diclofenac Sodium (VOLTAREN) 1 % GEL Apply 2 g topically 3 (three) times daily as needed for pain.     ezetimibe (ZETIA) 10 MG tablet Take 1 tablet (10 mg total) by mouth daily. 90 tablet 3   fluticasone (FLONASE) 50 MCG/ACT nasal spray Place 1 spray into both nostrils daily as needed for allergies or rhinitis.     furosemide (LASIX) 40 MG tablet Take 40 mg by mouth See admin instructions. Take 1 tablet (40 mg) by mouth every other day alternating with 80 mg dosage     HYDROcodone-acetaminophen (NORCO/VICODIN) 5-325 MG tablet Take 1 tablet by mouth every 6 (six) hours as needed for moderate pain or severe pain.     methocarbamol (ROBAXIN) 500 MG tablet Take 500 mg by mouth 3 (three) times daily as needed for muscle spasms.     Multiple Vitamin (MULTIVITAMIN) capsule Take 1 capsule by mouth in the morning.     nitroGLYCERIN (NITROSTAT) 0.4 MG SL tablet 1 TAB UNDER THE TONGUE EVERY 5 MINUTES AS NEEDED FOR CHEST PAIN UP TO 3 DOSES, IF PERSIST CALL 911 25 tablet 3   ONETOUCH VERIO test strip 1 each daily.     pantoprazole (PROTONIX) 40 MG tablet Take 40 mg by mouth daily as needed (heartburn).     Semaglutide,0.25 or 0.'5MG'$ /DOS, (OZEMPIC, 0.25 OR 0.5 MG/DOSE,) 2 MG/1.5ML SOPN Inject 0.5 mg into the skin once a week. 4.5 mL 3   Testosterone 20.25 MG/1.25GM (1.62%) GEL Place 2 Pump onto the skin daily. Applied to chest     traZODone (DESYREL) 100 MG tablet Take 100 mg by mouth at bedtime.     No current facility-administered medications for this encounter.    BP 120/70   Pulse (!) 58   Wt 89.5 kg (197 lb 6.4 oz)   SpO2 100%   BMI 29.15 kg/m    Wt Readings from Last 3 Encounters:  04/21/22 89.5 kg (197 lb 6.4 oz)  04/14/22 90.4 kg (199 lb 4.7 oz)  04/08/22 89.4 kg (197 lb 3.2 oz)   General: NAD Neck: No JVD, no thyromegaly or thyroid nodule.  Lungs: Clear to auscultation bilaterally with normal respiratory effort. CV: Nondisplaced PMI.   Heart regular S1/S2, no S3/S4, 1/6 SEM RUSB.  No peripheral edema.  No carotid bruit.  Normal pedal pulses.  Abdomen: Soft, nontender, no hepatosplenomegaly, no distention.  Skin: Intact without lesions or rashes.  Neurologic: Alert and oriented x 3.  Psych: Normal affect. Extremities: No clubbing or cyanosis.  HEENT: Normal.   Assessment/Plan: 1. S/p mitral valve repair:  Echo in 8/22 showed mild-moderate stenosis with mean gradient 8 mmHg and mild MR.  TEE in 8/23 showed MV repair with mean gradient 10 mmHg but MVA 2.19 cm^2 (leaflets open well), trivial MR.  There is likely a degree of mitral stenosis from a relatively tight repair.  I do not think this is clinically significant.  - With prosthetic material, should use antibiotic prophylaxis with dental work.  2. CAD:  He has severe stenosis in a distal D1 that is not amenable to intervention.  This has been stable on last couple of caths. He has occluded RCA and LCx known from the past with SVGs to OM and PDA.  Echo in 2/22 with EF 45-50%. Cardiolite in 11/19 showed no ischemia. Echo in 8/22 with EF up to 55-60%,  TEE in 8/23 with EF 55%.  He has chronic atypical chest pain that I do not think is ischemic.  - Currently not on ASA, taking apixaban.  - He cannot tolerate statins with myalgias, Now on Praluent.    3. Hyperlipidemia: Myalgias with Crestor, pravastatin, and atorvastatin.  We were finally able to get him Praluent through the [place]. He is also on Zetia.  - Check lipids today.    4. Chronic primarily diastolic CHF:  NYHA class II symptoms, he is not volume overloaded on exam.  TEE in 8/23 with EF 55%, mild RV enlargement with normal RV function.  This is worsened by CKD stage IV.  - Continue Lasix 80 mg daily alternating with 40 mg daily. BMET today.  5. OSA: Continue CPAP.  6. Atrial fibrillation: Paroxysmal, in NSR after DCCV in 8/23.  He is  on amiodarone. Probably not a good ablation candidate with size of atria.   - Continue  apixaban 5 mg bid.  - Continue amiodarone 200 mg daily. Check LFTs, TSH today.  He will need a regular eye exam.  7. CKD stage 4: AKI without recovery, uncertain etiology.  He sees nephrology, there is concern that he is progressing towards HD. He now has AV fistula. Recent creatinine 4.56.   - BMET today.  8. Diabetes: He is now followed by endocrinology. 9. Atrial tachycardia: Paroxysmal.  Seen by Dr. Rayann Heman, medical management planned.  Continue amiodarone.  10. CVA: Though to be atrial fibrillation-related.  Carotid dopplers 6/22 with mild stenosis only. However, he was also noted on 8/22 echo to have a PFO.  - For now, will continue on Eliquis.  11. Enterococcal bacteremia: In 7/22.  On TEE, it was hard to differentiate between loose suture material and small vegetation.  He was treated for 6 wks with IV abx.   He can return to [place] class.  Followup in AF clinic in 2 months.  Followup 6 months with APP.   [name] 04/21/2022

## 2022-04-22 NOTE — Telephone Encounter (Signed)
Patient with diagnosis of A Fib on Eliquis for anticoagulation.    Procedure:  Cystoscopy with Ureteroscopy Laser Lithotripsy Stent Exchange Date of procedure: TBD   CHA2DS2-VASc Score = 8  This indicates a 10.8% annual risk of stroke. The patient's score is based upon: CHF History: 1 HTN History: 1 Diabetes History: 1 Stroke History: 2 Vascular Disease History: 1 Age Score: 2 Gender Score: 0   CrCl 19 mL/min Platelet count 150K  Patient with impaired renal function and is high risk off anticoagulation with history of stroke and elevated CHADs-VASc score.  Will route to Dr Aundra Dubin for input.  **This guidance is not considered finalized until pre-operative APP has relayed final recommendations.**

## 2022-04-22 NOTE — Telephone Encounter (Signed)
Pt just had DCCV 04/14/2022. Will need to postpone surgery until after 05/15/2022 (cannot hold anticoagulation for 1 month post DCCV).  Pt just saw Dr. Aundra Dubin. Will route to him to see if he can provide clearance for procedure (after 9/22).  Callback:  Please let surgeon's office know that procedure cannot be done before 05/15/2022.  Richardson Dopp, PA-C    04/22/2022 5:01 PM

## 2022-04-23 NOTE — Telephone Encounter (Signed)
I will forward to pre op and Dr. Aundra Dubin to see the notes from [place] today , stating they still need to know about holding Eliquis.

## 2022-04-23 NOTE — Telephone Encounter (Signed)
Follow Up:     [name] is calling back from Dr Marcelino Duster Office. She said they sstill need to know about holding patient's Eliquis 2 to 3 days prior to his procedure please. Please fax to [phone],

## 2022-04-24 NOTE — Telephone Encounter (Signed)
Will forward to pre op for review and any final notes

## 2022-04-28 NOTE — Telephone Encounter (Signed)
   Primary Cardiologist: [name], MD  Chart reviewed as part of pre-operative protocol coverage. Given past medical history and time since last visit, based on ACC/AHA guidelines, AIZEN DUVAL would be at acceptable risk for the planned procedure without further cardiovascular testing.   Per Dr. Aundra Dubin, patient may hold Eliquis for 2 days prior to cystoscopy with ureteroscopy laser lithotripsy stent exchange. Procedure may not be completed prior to 05/16/22.   I will route this recommendation to the requesting party via Epic fax function and remove from pre-op pool.  Please call with questions.  Emmaline Life, NP-C    04/28/2022, 12:21 PM [address] Office [phone] Fax [phone]

## 2022-05-04 ENCOUNTER — Telehealth: Payer: Self-pay

## 2022-05-04 ENCOUNTER — Other Ambulatory Visit: Payer: Self-pay | Admitting: Urology

## 2022-05-04 NOTE — Telephone Encounter (Signed)
NOTES SCANNED TO REFERRAL 

## 2022-05-08 ENCOUNTER — Telehealth (HOSPITAL_COMMUNITY): Payer: Self-pay | Admitting: *Deleted

## 2022-05-08 NOTE — Telephone Encounter (Signed)
[facility] referral number [id number] 04/08/22--10/06/22 for [name].

## 2022-05-16 NOTE — Progress Notes (Signed)
COVID Vaccine received:  '[]'$  No '[x]'$  Yes Date of any COVID positive Test in last 90 days:  PCP - [name], MD Cardiologist - Dr. [name] Cardiac clearance Tanja Port, NP-C Note on 04-28-22  Epic  Nephrologist- [facility] - Jen Mow, [place]  Chest x-ray -  EKG -  04-21-2022 Epic Stress Test -  ECHO - 04-14-2022  TEE   Epic Cardiac Cath - 04-29-2017  Dr. Aundra Dubin  Pacemaker/ICD device     '[]'$  N/A Spinal Cord Stimulator:'[]'$  No '[]'$  Yes      (Remind patient to bring remote DOS) Other Implants:   Bowel Prep -   History of Sleep Apnea? '[]'$  No '[x]'$  Yes   Sleep Study Date:  2019 CPAP used?- '[]'$  No '[x]'$  Yes  (Instruct to bring their mask & Tubing)  Does the patient monitor blood sugar? '[]'$  No '[]'$  Yes  '[]'$  N/A Does patient have a [employer] or Dexacom? '[]'$  No '[x]'$  Yes    DEXACOM Fasting Blood Sugar Ranges-  Checks Blood Sugar _____ times a day  Blood Thinner Instructions:Eliquis- hold x 2 days Ok'd per Drs. Allred and [facility] Aspirin Instructions: Last Dose:  ERAS Protocol Ordered: '[x]'$  No  '[]'$  Yes PRE-SURGERY '[]'$  ENSURE  '[]'$  G2   Comments: Has left arm AVF- will start HD soon. 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 CKD4-5- has Left arm AVF, plan to start HD soon.   Patient denies shortness of breath, fever, cough and chest pain at PAT appointment.  Patient verbalized understanding and agreement to the Pre-Surgical Instructions that were given to them at this PAT appointment. Patient was also educated of the need to review these PAT instructions again prior to his/her surgery.I reviewed the appropriate phone numbers to call if they have any and questions or concerns.

## 2022-05-18 DIAGNOSIS — N201 Calculus of ureter: Secondary | ICD-10-CM | POA: Diagnosis not present

## 2022-05-18 NOTE — Patient Instructions (Signed)
SURGICAL WAITING ROOM VISITATION Patients having surgery or a procedure may have no more than 2 support people in the waiting area - these visitors may rotate in the visitor waiting room.   Children under the age of 15 must have an adult with them who is not the patient. If the patient needs to stay at the hospital during part of their recovery, the visitor guidelines for inpatient rooms apply.  PRE-OP VISITATION  Pre-op nurse will coordinate an appropriate time for 1 support person to accompany the patient in pre-op.  This support person may not rotate.  This visitor will be contacted when the time is appropriate for the visitor to come back in the pre-op area.  Please refer to the [facility] website for the visitor guidelines for Inpatients (after your surgery is over and you are in a regular room).  You are not required to quarantine at this time prior to your surgery. However, you must do this: Hand Hygiene often Do NOT share personal items Notify your provider if you are in close contact with someone who has COVID or you develop fever 100.4 or greater, new onset of sneezing, cough, sore throat, shortness of breath or body aches.   If you received a COVID test during your pre-op visit  it is requested that you wear a mask when out in public, stay away from anyone that may not be feeling well and notify your surgeon if you develop symptoms. If you test positive for Covid or have been in contact with anyone that has tested positive in the last 10 days please notify you surgeon.       Your procedure is scheduled on:  Wednesday  May 27, 2022  Report to [facility] Main Entrance.  Report to admitting at:  06:15   AM  +++++Call this number if you have any questions or problems the morning of surgery [phone]  Do not eat or drink anything after Midnight the night prior to your surgery/procedure.                FOLLOW BOWEL PREP AND ANY ADDITIONAL PRE OP INSTRUCTIONS YOU  RECEIVED FROM YOUR SURGEON'S OFFICE!!!   Oral Hygiene is also important to reduce your risk of infection.        Remember - BRUSH YOUR TEETH THE MORNING OF SURGERY WITH YOUR REGULAR TOOTHPASTE  Do NOT smoke after Midnight the night before surgery.   ELIQUIS (Blood Thinner) stop taking this two (2) day before your surgery ( last dose will be on Sunday 05-24-22)  OZEMPIC- Do not inject within 1 week of surgery.   Take ONLY these medicines the morning of surgery with A SIP OF WATER: Buspirone, Amiodarone, If needed you may take Hydrocodone, colchicine, pantoprazole   Bring CPAP mask and tubing day of surgery.                   You may not have any metal on your body including hair pins, jewelry, and body piercing  Do not wear make-up, lotions, powders, perfumes/cologne, or deodorant  Do not wear nail polish including gel and S&S, artificial / acrylic nails, or any other type of covering on natural nails including finger and toenails. If you have artificial nails, gel coating, etc., that needs to be removed by a nail salon, Please have this removed prior to surgery. Not doing so may mean that your surgery could be cancelled or delayed if the Surgeon or anesthesia staff feels like  they are unable to monitor you safely.   Do not shave 48 hours prior to surgery to avoid nicks in your skin which may contribute to postoperative infections.   Men may shave face and neck.  Contacts, Hearing Aids, dentures or bridgework may not be worn into surgery.   You may bring a small overnight bag with you on the day of surgery, only pack items that are not valuable .[facility] IS NOT RESPONSIBLE   FOR VALUABLES THAT ARE LOST OR STOLEN.   DO NOT [place]. PHARMACY WILL DISPENSE MEDICATIONS LISTED ON YOUR MEDICATION LIST TO YOU DURING YOUR ADMISSION [place]!   Patients discharged on the day of surgery will not be allowed to drive home.  Someone NEEDS to stay with  you for the first 24 hours after anesthesia.  Special Instructions: Bring a copy of your healthcare power of attorney and living will documents the day of surgery, if you wish to have them scanned into your [facility] Records- EPIC  Please read over the following fact sheets you were given: IF YOU HAVE QUESTIONS ABOUT YOUR PRE-OP INSTRUCTIONS, PLEASE CALL [phone]  ([place])   [facility] - Preparing for Surgery Before surgery, you can play an important role.  Because skin is not sterile, your skin needs to be as free of germs as possible.  You can reduce the number of germs on your skin by washing with CHG (chlorahexidine gluconate) soap before surgery.  CHG is an antiseptic cleaner which kills germs and bonds with the skin to continue killing germs even after washing. Please DO NOT use if you have an allergy to CHG or antibacterial soaps.  If your skin becomes reddened/irritated stop using the CHG and inform your nurse when you arrive at Short Stay. Do not shave (including legs and underarms) for at least 48 hours prior to the first CHG shower.  You may shave your face/neck.  Please follow these instructions carefully:  1.  Shower with CHG Soap the night before surgery and the  morning of surgery.  2.  If you choose to wash your hair, wash your hair first as usual with your normal  shampoo.  3.  After you shampoo, rinse your hair and body thoroughly to remove the shampoo.                             4.  Use CHG as you would any other liquid soap.  You can apply chg directly to the skin and wash.  Gently with a scrungie or clean washcloth.  5.  Apply the CHG Soap to your body ONLY FROM THE NECK DOWN.   Do not use on face/ open                           Wound or open sores. Avoid contact with eyes, ears mouth and genitals (private parts).                       Wash face,  Genitals (private parts) with your normal soap.             6.  Wash thoroughly, paying special attention to the area  where your  surgery  will be performed.  7.  Thoroughly rinse your body with warm water from the neck down.  8.  DO NOT shower/wash with  your normal soap after using and rinsing off the CHG Soap.            9.  Pat yourself dry with a clean towel.            10.  Wear clean pajamas.            11.  Place clean sheets on your bed the night of your first shower and do not  sleep with pets.  ON THE DAY OF SURGERY : Do not apply any lotions/deodorants the morning of surgery.  Please wear clean clothes to the hospital/surgery center.    FAILURE TO FOLLOW THESE INSTRUCTIONS MAY RESULT IN THE CANCELLATION OF YOUR SURGERY  PATIENT SIGNATURE_________________________________  NURSE SIGNATURE__________________________________  ________________________________________________________________________

## 2022-05-19 ENCOUNTER — Other Ambulatory Visit: Payer: Self-pay

## 2022-05-19 ENCOUNTER — Encounter (HOSPITAL_COMMUNITY)
Admission: RE | Admit: 2022-05-19 | Discharge: 2022-05-19 | Disposition: A | Discharge status: Home / Self Care | Payer: Medicare Other | Source: Ambulatory Visit | Attending: Urology | Admitting: Urology

## 2022-05-19 ENCOUNTER — Encounter (HOSPITAL_COMMUNITY): Payer: Self-pay

## 2022-05-19 VITALS — BP 128/88 | HR 58 | Temp 97.7°F | Resp 16 | Ht 69.0 in | Wt 192.0 lb

## 2022-05-19 DIAGNOSIS — Z9884 Bariatric surgery status: Secondary | ICD-10-CM | POA: Insufficient documentation

## 2022-05-19 DIAGNOSIS — I130 Hypertensive heart and chronic kidney disease with heart failure and stage 1 through stage 4 chronic kidney disease, or unspecified chronic kidney disease: Secondary | ICD-10-CM | POA: Diagnosis not present

## 2022-05-19 DIAGNOSIS — N201 Calculus of ureter: Secondary | ICD-10-CM | POA: Insufficient documentation

## 2022-05-19 DIAGNOSIS — Z7901 Long term (current) use of anticoagulants: Secondary | ICD-10-CM | POA: Insufficient documentation

## 2022-05-19 DIAGNOSIS — Z951 Presence of aortocoronary bypass graft: Secondary | ICD-10-CM | POA: Insufficient documentation

## 2022-05-19 DIAGNOSIS — E1122 Type 2 diabetes mellitus with diabetic chronic kidney disease: Secondary | ICD-10-CM | POA: Insufficient documentation

## 2022-05-19 DIAGNOSIS — I340 Nonrheumatic mitral (valve) insufficiency: Secondary | ICD-10-CM | POA: Insufficient documentation

## 2022-05-19 DIAGNOSIS — Z8616 Personal history of COVID-19: Secondary | ICD-10-CM | POA: Diagnosis not present

## 2022-05-19 DIAGNOSIS — Z955 Presence of coronary angioplasty implant and graft: Secondary | ICD-10-CM | POA: Insufficient documentation

## 2022-05-19 DIAGNOSIS — Z8673 Personal history of transient ischemic attack (TIA), and cerebral infarction without residual deficits: Secondary | ICD-10-CM | POA: Insufficient documentation

## 2022-05-19 DIAGNOSIS — I252 Old myocardial infarction: Secondary | ICD-10-CM | POA: Insufficient documentation

## 2022-05-19 DIAGNOSIS — Z79899 Other long term (current) drug therapy: Secondary | ICD-10-CM | POA: Insufficient documentation

## 2022-05-19 DIAGNOSIS — Z01812 Encounter for preprocedural laboratory examination: Secondary | ICD-10-CM | POA: Diagnosis not present

## 2022-05-19 DIAGNOSIS — I2510 Atherosclerotic heart disease of native coronary artery without angina pectoris: Secondary | ICD-10-CM

## 2022-05-19 DIAGNOSIS — Z01818 Encounter for other preprocedural examination: Secondary | ICD-10-CM

## 2022-05-19 DIAGNOSIS — Z87442 Personal history of urinary calculi: Secondary | ICD-10-CM | POA: Insufficient documentation

## 2022-05-19 DIAGNOSIS — E785 Hyperlipidemia, unspecified: Secondary | ICD-10-CM | POA: Diagnosis not present

## 2022-05-19 DIAGNOSIS — I5032 Chronic diastolic (congestive) heart failure: Secondary | ICD-10-CM | POA: Diagnosis not present

## 2022-05-19 DIAGNOSIS — N184 Chronic kidney disease, stage 4 (severe): Secondary | ICD-10-CM | POA: Insufficient documentation

## 2022-05-19 DIAGNOSIS — I4891 Unspecified atrial fibrillation: Secondary | ICD-10-CM | POA: Diagnosis not present

## 2022-05-19 DIAGNOSIS — E119 Type 2 diabetes mellitus without complications: Secondary | ICD-10-CM

## 2022-05-19 HISTORY — DX: Personal history of urinary calculi: Z87.442

## 2022-05-19 HISTORY — DX: Pneumonia, unspecified organism: J18.9

## 2022-05-19 LAB — HEMOGLOBIN A1C
Hgb A1c MFr Bld: 6.8 % — ABNORMAL HIGH (ref 4.8–5.6)
Mean Plasma Glucose: 148.46 mg/dL

## 2022-05-19 LAB — CBC
HCT: 36.5 % — ABNORMAL LOW (ref 39.0–52.0)
Hemoglobin: 12.2 g/dL — ABNORMAL LOW (ref 13.0–17.0)
MCH: 31.9 pg (ref 26.0–34.0)
MCHC: 33.4 g/dL (ref 30.0–36.0)
MCV: 95.3 fL (ref 80.0–100.0)
Platelets: 160 10*3/uL (ref 150–400)
RBC: 3.83 MIL/uL — ABNORMAL LOW (ref 4.22–5.81)
RDW: 12.8 % (ref 11.5–15.5)
WBC: 6.9 10*3/uL (ref 4.0–10.5)
nRBC: 0 % (ref 0.0–0.2)

## 2022-05-19 LAB — BASIC METABOLIC PANEL
Anion gap: 8 (ref 5–15)
BUN: 49 mg/dL — ABNORMAL HIGH (ref 8–23)
CO2: 26 mmol/L (ref 22–32)
Calcium: 9.9 mg/dL (ref 8.9–10.3)
Chloride: 104 mmol/L (ref 98–111)
Creatinine, Ser: 4.06 mg/dL — ABNORMAL HIGH (ref 0.61–1.24)
GFR, Estimated: 15 mL/min — ABNORMAL LOW (ref >60)
Glucose, Bld: 125 mg/dL — ABNORMAL HIGH (ref 70–99)
Potassium: 4.1 mmol/L (ref 3.5–5.1)
Sodium: 138 mmol/L (ref 135–145)

## 2022-05-19 LAB — SURGICAL PCR SCREEN
MRSA, PCR: NEGATIVE
Staphylococcus aureus: NEGATIVE

## 2022-05-20 NOTE — Progress Notes (Signed)
Anesthesia Chart Review   Case: [id number] Date/Time: 05/27/22 0815   Procedure: CYSTOSCOPY/URETEROSCOPY/HOLMIUM LASER/STENT EXCHANGE (Right)   Anesthesia type: General   Pre-op diagnosis: RIGHT URETERAL STONE   Location: [place] [facility]   Surgeons: Jason Mons, MD       DISCUSSION:75 y.o. never smoker with h/o HTN, CHF, CAD (CABG 1995), s/p mitral valve repair 2017, atrial fibrillation (on Eliquis), CKD 4 with AVF in place with plans to start HD soon, right ureteral stone scheduled for above procedure 05/27/2022 with Dr. Harrell Gave Lovena [name].   S/p lap band.   Advised to hold Ozempic one week prior to procedure.    Pt last seen by cardiology 04/21/2022.   Per cardiology preoperative evaluation 04/28/2022, "Chart reviewed as part of pre-operative protocol coverage. Given past medical history and time since last visit, based on ACC/AHA guidelines, [name] would be at acceptable risk for the planned procedure without further cardiovascular testing.    Per Dr. Aundra [name], patient may hold Eliquis for 2 days prior to cystoscopy with ureteroscopy laser lithotripsy stent exchange. Procedure may not be completed prior to 05/16/22. "  Anticipate pt can proceed with planned procedure barring acute status change.   VS: BP 128/88 Comment: right arm sitting  Pulse (!) 58   Temp 36.5 C   Resp 16   Ht '5\' 9"'$  (1.753 m)   Wt 87.1 kg   SpO2 100%   BMI 28.35 kg/m   PROVIDERS: Jason Cruel, MD is PCP   Cardiologist - Dr. Loralie [name]  Nephrologist- [name] - Jason Mow, PA LABS: Labs reviewed: Acceptable for surgery. (all labs ordered are listed, but only abnormal results are displayed)  Labs Reviewed  HEMOGLOBIN A1C - Abnormal; Notable for the following components:      Result Value   Hgb A1c MFr Bld 6.8 (*)    All other components within normal limits  BASIC METABOLIC PANEL - Abnormal; Notable for the following components:   Glucose, Bld 125  (*)    BUN 49 (*)    Creatinine, Ser 4.06 (*)    GFR, Estimated 15 (*)    All other components within normal limits  CBC - Abnormal; Notable for the following components:   RBC 3.83 (*)    Hemoglobin 12.2 (*)    HCT 36.5 (*)    All other components within normal limits  SURGICAL PCR SCREEN     IMAGES:   EKG:   CV: Echo 04/14/2022  1. Left ventricular ejection fraction, by estimation, is 55%. The left  ventricle has normal function. The left ventricle has no regional wall  motion abnormalities.   2. Right ventricular systolic function is normal. The right ventricular  size is mildly enlarged. Tricuspid regurgitation signal is inadequate for  assessing PA pressure.   3. Left atrial size was moderately dilated. No left atrial/left atrial  appendage thrombus was detected.   4. Right atrial size was moderately dilated.   5. Status post mitral valve repair. Mean gradient 10 mmHg across the  mitral valve, MVA 2.19 cm^2. The leaflets open well, the valve looks like  it was repaired tightly. Trivial regurgitation.   6. The aortic valve is tricuspid. There is mild calcification of the  aortic valve. Aortic valve regurgitation is not visualized. No aortic  stenosis is present.   7. PFO noted by color doppler.   Myocardial Perfusion 06/24/2018   Nuclear stress EF: 47%. There was no ST segment deviation noted during stress.  Findings consistent with prior myocardial infarction. This is an intermediate risk study. The left ventricular ejection fraction is mildly decreased (45-54%).   Moderate sized inferior wall infarct from apex to base No ischemia EF 47% with inferior and apical hypokinesis Intermediate risk study due to EF   Cardiac Cath 04/29/2017 1. Patent SVG-OM and SVG-PDA.  2. LCA system looks similar to prior cath in 6/17.  D1 is a likely source for angina.  No good interventional option here, will continue to treat medically.  He has been started on Imdur and has not had  any further chest pain.  I warned him about avoiding Cialis while using Imdur.  3. Creatinine at his baseline, 1.7.  Will hydrate prior to discharge.  Past Medical History:  Diagnosis Date   Allergic rhinitis    Anxiety    Atrial fibrillation (HCC)    Cancer (HCC)    hx of skin cancers    Chronic [name] disease    Coronary artery disease    a. s/p MI & CABG; b. s/p PCI Diag;  c. Cath 12/2011: LAD diff dzs/small, Diag 75% isr, 90 dist to stent (small), LCX & RCA occluded, VG->OM 40, VG->PDA patent - med rx.   COVID-19    x 3   Depression    PTSD   Diabetes mellitus    not on medications   Diastolic CHF, chronic ([place])    a. EF 55-60% by echo 2007   GERD (gastroesophageal reflux disease)    "not anymore" " I had a lap band"   History of diverticulitis of colon    5 YRS AGO   History of [name] stones    History of pneumonia    2017   History of transient ischemic attack (TIA)    CAROTID DOPPLERS NOV 2011  0-39& BIL. STENOSIS   Hyperlipidemia    Hypertension    [name] failure    Mitral regurgitation    Myocardial infarction [facility]    Neuromuscular disorder (HCC)    left arm numbness    OA (osteoarthritis)    RIGHT KNEE ARTHOFIBROSIS W/ PAIN  (S/P  REPLACEMENT 2004)   Pneumonia    PTSD (post-traumatic stress disorder)    from [place] since 1973   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/25/2016   Complex valvuloplasty including artificial Gore-tex neochord placement x4, plication of anterior commissure, and 26 mm Sorin Memo 3D ring annuloplasty via right mini thoracotomy approach   Shortness of breath dyspnea    with exertion   Skin cancer    Sleep apnea    cpap- see ov note in EPIC 05/12/11 for settings    Stroke [facility])    hx of   Stroke [facility])     Past Surgical History:  Procedure Laterality Date   AV FISTULA PLACEMENT Left 08/02/2018   Procedure: ARTERIOVENOUS (AV) FISTULA CREATION RADIOCEPHALIC;  Surgeon: Angelia Mould, MD;  Location: [place];  Service:  Vascular;  Laterality: Left;   BLEPHAROPLASTY  [place]  2001, 2004, 2009, 2011   CARDIAC CATHETERIZATION N/A 01/23/2016   Procedure: Right/Left Heart Cath and Coronary Angiography;  Surgeon: [name], MD;  Location: [place] CV LAB;  Service: Cardiovascular;  Laterality: N/A;   CARDIOVERSION N/A 09/07/2017   Procedure: CARDIOVERSION;  Surgeon: [name], MD;  Location: [facility] ENDOSCOPY;  Service: Cardiovascular;  Laterality: N/A;   CARDIOVERSION N/A 09/13/2019   Procedure: CARDIOVERSION;  Surgeon: [name], MD;  Location: [place];  Service: Cardiovascular;  Laterality: N/A;   CARDIOVERSION N/A 10/19/2019   Procedure: CARDIOVERSION;  Surgeon: [name], MD;  Location: [facility] ENDOSCOPY;  Service: Cardiovascular;  Laterality: N/A;   CARDIOVERSION N/A 04/14/2022   Procedure: CARDIOVERSION;  Surgeon: [name], MD;  Location: [facility] ENDOSCOPY;  Service: Cardiovascular;  Laterality: N/A;   CATARACT EXTRACTION W/ INTRAOCULAR LENS IMPLANT Bilateral    CHONDROPLASTY  07/22/2011   Procedure: CHONDROPLASTY;  Surgeon: Dione Plover Aluisio;  Location: [place];  Service: Orthopedics;;   CIRCUMCISION  [place]-- POST CABG   W/ STENT, last cath 01/07/2012    CORONARY ARTERY BYPASS GRAFT  1995   X3 VESSEL   CORONARY ARTERY BYPASS GRAFT  1995   CORONARY STENT PLACEMENT     CYSTOSCOPY W/ URETERAL STENT PLACEMENT Right 04/11/2022   Procedure: CYSTOSCOPY WITH RETROGRADE PYELOGRAM/URETERAL STENT PLACEMENT;  Surgeon: Cleon Gustin, MD;  Location: [facility];  Service: Urology;  Laterality: Right;   CYSTOSCOPY/URETEROSCOPY/HOLMIUM LASER/STENT PLACEMENT Right 01/08/2021   Procedure: CYSTOSCOPY/RETROGRADE/URETEROSCOPY/HOLMIUM LASER/STENT PLACEMENT;  Surgeon: Franchot Gallo, MD;  Location: [facility];  Service: Urology;  Laterality: Right;   INCISION AND DRAINAGE PERIRECTAL ABSCESS N/A 09/01/2021    Procedure: IRRIGATION AND DEBRIDEMENT PERIRECTAL ABSCESS;  Surgeon: Kieth Brightly Arta Bruce, MD;  Location: [facility];  Service: General;  Laterality: N/A;   IR PERC TUN PERIT CATH WO PORT S&I [place]  03/06/2021   IR REMOVAL TUN CV CATH W/O FL  04/14/2021   KNEE ARTHROSCOPY  07/22/2011   Procedure: ARTHROSCOPY KNEE;  Surgeon: [name];  Location: [place];  Service: Orthopedics;  Laterality: Right;  WITH DEBRIDEMENT    KNEE ARTHROSCOPY W/ MENISCECTOMY  X2 IN 2002-- RIGHT KNEE   LAPAROSCOPIC GASTRIC BANDING  01-15-09   LEFT HEART CATH AND CORONARY ANGIOGRAPHY N/A 04/29/2017   Procedure: LEFT HEART CATH AND CORONARY ANGIOGRAPHY;  Surgeon: [name], MD;  Location: [place] CV LAB;  Service: Cardiovascular;  Laterality: N/A;   LEFT HEART CATHETERIZATION WITH CORONARY ANGIOGRAM N/A 09/11/2013   Procedure: LEFT HEART CATHETERIZATION WITH CORONARY ANGIOGRAM;  Surgeon: [name], MD;  Location: [facility] CATH LAB;  Service: Cardiovascular;  Laterality: N/A;   MITRAL VALVE REPAIR Right 03/25/2016   Procedure: MINIMALLY INVASIVE REOPERATION FOR MITRAL VALVE REPAIR  (MVR) with size 26 Sorin Memo 3D;  Surgeon: [name], MD;  Location: [place];  Service: Open Heart Surgery;  Laterality: Right;   MITRAL VALVE REPAIR  03/2016   RIGHT KNEE ED COMPARTMENT REPLACEMENT  2004   SYNOVECTOMY  07/22/2011   Procedure: SYNOVECTOMY;  Surgeon: Dione Plover Aluisio;  Location: [place];  Service: Orthopedics;;   TEE WITHOUT CARDIOVERSION N/A 01/23/2016   Procedure: TRANSESOPHAGEAL ECHOCARDIOGRAM (TEE);  Surgeon: [name], MD;  Location: [place];  Service: Cardiovascular;  Laterality: N/A;   TEE WITHOUT CARDIOVERSION N/A 03/25/2016   Procedure: TRANSESOPHAGEAL ECHOCARDIOGRAM (TEE);  Surgeon: [name], MD;  Location: [place];  Service: Open Heart Surgery;  Laterality: N/A;   TEE WITHOUT CARDIOVERSION N/A 03/04/2021   Procedure: TRANSESOPHAGEAL ECHOCARDIOGRAM (TEE);   Surgeon: Lelon Perla, MD;  Location: [facility] ENDOSCOPY;  Service: Cardiovascular;  Laterality: N/A;   TEE WITHOUT CARDIOVERSION N/A 04/14/2022   Procedure: TRANSESOPHAGEAL ECHOCARDIOGRAM (TEE);  Surgeon: [name], MD;  Location: [place];  Service: Cardiovascular;  Laterality: N/A;   UMBILICAL HERNIA REPAIR  01-15-09  W/ GASTRIC BANDING PROCEDURE    MEDICATIONS:  Alirocumab (PRALUENT) 75 MG/ML SOAJ   allopurinol (ZYLOPRIM) 100 MG tablet   amiodarone (PACERONE) 200 MG tablet   apixaban (ELIQUIS) 5 MG TABS tablet   busPIRone (BUSPAR) 10 MG tablet   cetirizine (ZYRTEC) 10 MG tablet   colchicine 0.6 MG tablet   Continuous Blood Gluc Sensor (DEXCOM G7 SENSOR) MISC   Cyanocobalamin (VITAMIN B-12) 5000 MCG TBDP   cycloSPORINE (RESTASIS) 0.05 % ophthalmic emulsion   diclofenac Sodium (VOLTAREN) 1 % GEL   ezetimibe (ZETIA) 10 MG tablet   fluticasone (FLONASE) 50 MCG/ACT nasal spray   furosemide (LASIX) 40 MG tablet   HYDROcodone-acetaminophen (NORCO/VICODIN) 5-325 MG tablet   methocarbamol (ROBAXIN) 500 MG tablet   Multiple Vitamin (MULTIVITAMIN) capsule   mupirocin ointment (BACTROBAN) 2 %   nitroGLYCERIN (NITROSTAT) 0.4 MG SL tablet   ONETOUCH VERIO test strip   pantoprazole (PROTONIX) 40 MG tablet   potassium chloride SA (KLOR-CON M) 20 MEQ tablet   Semaglutide,0.25 or 0.'5MG'$ /DOS, (OZEMPIC, 0.25 OR 0.5 MG/DOSE,) 2 MG/1.5ML SOPN   traZODone (DESYREL) 100 MG tablet   No current facility-administered medications for this encounter.    Konrad Felix Ward, PA-C [facility] [phone]

## 2022-05-20 NOTE — Anesthesia Preprocedure Evaluation (Addendum)
Anesthesia Evaluation  Patient identified by MRN, date of birth, ID band Patient awake    Reviewed: Allergy & Precautions, NPO status , Patient's Chart, lab work & pertinent test results  Airway Mallampati: II  TM Distance: >3 FB Neck ROM: Full    Dental no notable dental hx.    Pulmonary sleep apnea ,    Pulmonary exam normal breath sounds clear to auscultation       Cardiovascular hypertension, Pt. on medications + CAD, + Past MI and +CHF  Normal cardiovascular exam Rhythm:Regular Rate:Normal     Neuro/Psych Anxiety Depression CVA negative psych ROS   GI/Hepatic Neg liver ROS, GERD  ,  Endo/Other  negative endocrine ROSdiabetes  Renal/GU Renal disease  negative genitourinary   Musculoskeletal  (+) Arthritis , Osteoarthritis,    Abdominal   Peds negative pediatric ROS (+)  Hematology negative hematology ROS (+)   Anesthesia Other Findings   Reproductive/Obstetrics negative OB ROS                            Anesthesia Physical Anesthesia Plan  ASA: 3  Anesthesia Plan: General   Post-op Pain Management:    Induction: Intravenous  PONV Risk Score and Plan: 2 and Ondansetron, Midazolam and Treatment may vary due to age or medical condition  Airway Management Planned:   Additional Equipment:   Intra-op Plan:   Post-operative Plan: Extubation in OR  Informed Consent: I have reviewed the patients History and Physical, chart, labs and discussed the procedure including the risks, benefits and alternatives for the proposed anesthesia with the patient or authorized representative who has indicated his/her understanding and acceptance.     Dental advisory given  Plan Discussed with: CRNA  Anesthesia Plan Comments: (See PAT note 05/19/2022)       Anesthesia Quick Evaluation

## 2022-05-21 DIAGNOSIS — M5414 Radiculopathy, thoracic region: Secondary | ICD-10-CM | POA: Diagnosis not present

## 2022-05-21 DIAGNOSIS — Z8673 Personal history of transient ischemic attack (TIA), and cerebral infarction without residual deficits: Secondary | ICD-10-CM | POA: Diagnosis not present

## 2022-05-21 DIAGNOSIS — N184 Chronic kidney disease, stage 4 (severe): Secondary | ICD-10-CM | POA: Diagnosis not present

## 2022-05-21 DIAGNOSIS — M791 Myalgia, unspecified site: Secondary | ICD-10-CM | POA: Diagnosis not present

## 2022-05-27 ENCOUNTER — Ambulatory Visit (HOSPITAL_COMMUNITY)
Admission: RE | Admit: 2022-05-27 | Discharge: 2022-05-27 | Disposition: A | Discharge status: Home / Self Care | Payer: No Typology Code available for payment source | Attending: Urology | Admitting: Urology

## 2022-05-27 ENCOUNTER — Other Ambulatory Visit: Payer: Self-pay

## 2022-05-27 ENCOUNTER — Ambulatory Visit (HOSPITAL_BASED_OUTPATIENT_CLINIC_OR_DEPARTMENT_OTHER): Payer: No Typology Code available for payment source | Admitting: Anesthesiology

## 2022-05-27 ENCOUNTER — Ambulatory Visit (HOSPITAL_COMMUNITY): Payer: No Typology Code available for payment source | Admitting: Physician Assistant

## 2022-05-27 ENCOUNTER — Encounter (HOSPITAL_COMMUNITY): Payer: Self-pay | Admitting: Urology

## 2022-05-27 ENCOUNTER — Ambulatory Visit (HOSPITAL_COMMUNITY): Payer: No Typology Code available for payment source

## 2022-05-27 ENCOUNTER — Encounter (HOSPITAL_COMMUNITY)
Admission: RE | Disposition: A | Discharge status: Home / Self Care | Payer: Self-pay | Source: Home / Self Care | Attending: Urology

## 2022-05-27 DIAGNOSIS — N202 Calculus of kidney with calculus of ureter: Secondary | ICD-10-CM | POA: Diagnosis not present

## 2022-05-27 DIAGNOSIS — Z7901 Long term (current) use of anticoagulants: Secondary | ICD-10-CM | POA: Diagnosis not present

## 2022-05-27 DIAGNOSIS — Z8673 Personal history of transient ischemic attack (TIA), and cerebral infarction without residual deficits: Secondary | ICD-10-CM | POA: Insufficient documentation

## 2022-05-27 DIAGNOSIS — E119 Type 2 diabetes mellitus without complications: Secondary | ICD-10-CM

## 2022-05-27 DIAGNOSIS — Z85828 Personal history of other malignant neoplasm of skin: Secondary | ICD-10-CM | POA: Insufficient documentation

## 2022-05-27 DIAGNOSIS — N529 Male erectile dysfunction, unspecified: Secondary | ICD-10-CM | POA: Diagnosis not present

## 2022-05-27 DIAGNOSIS — Z7985 Long-term (current) use of injectable non-insulin antidiabetic drugs: Secondary | ICD-10-CM

## 2022-05-27 DIAGNOSIS — E1122 Type 2 diabetes mellitus with diabetic chronic kidney disease: Secondary | ICD-10-CM | POA: Diagnosis not present

## 2022-05-27 DIAGNOSIS — I4891 Unspecified atrial fibrillation: Secondary | ICD-10-CM | POA: Insufficient documentation

## 2022-05-27 DIAGNOSIS — N132 Hydronephrosis with renal and ureteral calculous obstruction: Secondary | ICD-10-CM | POA: Insufficient documentation

## 2022-05-27 DIAGNOSIS — E1151 Type 2 diabetes mellitus with diabetic peripheral angiopathy without gangrene: Secondary | ICD-10-CM | POA: Diagnosis not present

## 2022-05-27 DIAGNOSIS — Z952 Presence of prosthetic heart valve: Secondary | ICD-10-CM | POA: Insufficient documentation

## 2022-05-27 DIAGNOSIS — I2510 Atherosclerotic heart disease of native coronary artery without angina pectoris: Secondary | ICD-10-CM | POA: Diagnosis not present

## 2022-05-27 DIAGNOSIS — I252 Old myocardial infarction: Secondary | ICD-10-CM | POA: Insufficient documentation

## 2022-05-27 DIAGNOSIS — Z8616 Personal history of COVID-19: Secondary | ICD-10-CM | POA: Insufficient documentation

## 2022-05-27 DIAGNOSIS — F418 Other specified anxiety disorders: Secondary | ICD-10-CM

## 2022-05-27 DIAGNOSIS — G4730 Sleep apnea, unspecified: Secondary | ICD-10-CM | POA: Diagnosis not present

## 2022-05-27 DIAGNOSIS — I130 Hypertensive heart and chronic kidney disease with heart failure and stage 1 through stage 4 chronic kidney disease, or unspecified chronic kidney disease: Secondary | ICD-10-CM | POA: Diagnosis not present

## 2022-05-27 DIAGNOSIS — K219 Gastro-esophageal reflux disease without esophagitis: Secondary | ICD-10-CM | POA: Insufficient documentation

## 2022-05-27 DIAGNOSIS — Z79890 Hormone replacement therapy (postmenopausal): Secondary | ICD-10-CM | POA: Diagnosis not present

## 2022-05-27 DIAGNOSIS — Z951 Presence of aortocoronary bypass graft: Secondary | ICD-10-CM | POA: Insufficient documentation

## 2022-05-27 DIAGNOSIS — M1990 Unspecified osteoarthritis, unspecified site: Secondary | ICD-10-CM | POA: Diagnosis not present

## 2022-05-27 DIAGNOSIS — R3129 Other microscopic hematuria: Secondary | ICD-10-CM | POA: Insufficient documentation

## 2022-05-27 DIAGNOSIS — N184 Chronic kidney disease, stage 4 (severe): Secondary | ICD-10-CM | POA: Insufficient documentation

## 2022-05-27 HISTORY — PX: CYSTOSCOPY/URETEROSCOPY/HOLMIUM LASER/STENT PLACEMENT: SHX6546

## 2022-05-27 LAB — GLUCOSE, CAPILLARY: Glucose-Capillary: 108 mg/dL — ABNORMAL HIGH (ref 70–99)

## 2022-05-27 SURGERY — CYSTOSCOPY/URETEROSCOPY/HOLMIUM LASER/STENT PLACEMENT
Anesthesia: General | Laterality: Right

## 2022-05-27 MED ORDER — OXYCODONE HCL 5 MG PO TABS: 5.0000 mg | ORAL_TABLET | Freq: Once | ORAL | Status: DC | PRN

## 2022-05-27 MED ORDER — CEPHALEXIN 500 MG PO CAPS: 500.0000 mg | ORAL_CAPSULE | Freq: Two times a day (BID) | ORAL | 0 refills | Status: AC

## 2022-05-27 MED ORDER — ORAL CARE MOUTH RINSE: 15.0000 mL | Freq: Once | OROMUCOSAL | Status: AC

## 2022-05-27 MED ORDER — ONDANSETRON HCL 4 MG/2ML IJ SOLN
INTRAMUSCULAR | Status: DC | PRN
  Administered 2022-05-27: 4 mg via INTRAVENOUS

## 2022-05-27 MED ORDER — FENTANYL CITRATE (PF) 100 MCG/2ML IJ SOLN
INTRAMUSCULAR | Status: AC
  Filled 2022-05-27: qty 2

## 2022-05-27 MED ORDER — EPHEDRINE SULFATE-NACL 50-0.9 MG/10ML-% IV SOSY
PREFILLED_SYRINGE | INTRAVENOUS | Status: DC | PRN
  Administered 2022-05-27 (×3): 5 mg via INTRAVENOUS

## 2022-05-27 MED ORDER — PROPOFOL 10 MG/ML IV BOLUS
INTRAVENOUS | Status: DC | PRN
  Administered 2022-05-27: 200 ug via INTRAVENOUS

## 2022-05-27 MED ORDER — HYDROCODONE-ACETAMINOPHEN 5-325 MG PO TABS: 1.0000 | ORAL_TABLET | Freq: Four times a day (QID) | ORAL | 0 refills | Status: DC | PRN

## 2022-05-27 MED ORDER — HYDROMORPHONE HCL 1 MG/ML IJ SOLN: 0.2500 mg | INTRAMUSCULAR | Status: DC | PRN

## 2022-05-27 MED ORDER — PHENYLEPHRINE HCL (PRESSORS) 10 MG/ML IV SOLN
INTRAVENOUS | Status: AC
  Filled 2022-05-27: qty 1

## 2022-05-27 MED ORDER — DEXAMETHASONE SODIUM PHOSPHATE 10 MG/ML IJ SOLN
INTRAMUSCULAR | Status: DC | PRN
  Administered 2022-05-27: 8 mg via INTRAVENOUS

## 2022-05-27 MED ORDER — EPHEDRINE 5 MG/ML INJ
INTRAVENOUS | Status: AC
  Filled 2022-05-27: qty 5

## 2022-05-27 MED ORDER — 0.9 % SODIUM CHLORIDE (POUR BTL) OPTIME
TOPICAL | Status: DC | PRN
  Administered 2022-05-27: 500 mL

## 2022-05-27 MED ORDER — SODIUM CHLORIDE 0.9 % IR SOLN
Status: DC | PRN
  Administered 2022-05-27: 3000 mL

## 2022-05-27 MED ORDER — CEFAZOLIN SODIUM-DEXTROSE 2-4 GM/100ML-% IV SOLN
2.0000 g | Freq: Once | INTRAVENOUS | Status: AC
  Administered 2022-05-27: 2 g via INTRAVENOUS
  Filled 2022-05-27: qty 100

## 2022-05-27 MED ORDER — PROPOFOL 10 MG/ML IV BOLUS: INTRAVENOUS | Status: DC | PRN

## 2022-05-27 MED ORDER — LIDOCAINE HCL (PF) 2 % IJ SOLN
INTRAMUSCULAR | Status: AC
  Filled 2022-05-27: qty 5

## 2022-05-27 MED ORDER — LIDOCAINE 2% (20 MG/ML) 5 ML SYRINGE
INTRAMUSCULAR | Status: DC | PRN
  Administered 2022-05-27: 60 mg via INTRAVENOUS

## 2022-05-27 MED ORDER — ONDANSETRON HCL 4 MG/2ML IJ SOLN
INTRAMUSCULAR | Status: AC
  Filled 2022-05-27: qty 2

## 2022-05-27 MED ORDER — OXYCODONE HCL 5 MG/5ML PO SOLN: 5.0000 mg | Freq: Once | ORAL | Status: DC | PRN

## 2022-05-27 MED ORDER — SODIUM CHLORIDE 0.9 % IV SOLN: INTRAVENOUS | Status: DC | PRN

## 2022-05-27 MED ORDER — LACTATED RINGERS IV SOLN: INTRAVENOUS | Status: DC

## 2022-05-27 MED ORDER — PHENAZOPYRIDINE HCL 200 MG PO TABS: 200.0000 mg | ORAL_TABLET | Freq: Three times a day (TID) | ORAL | 0 refills | Status: DC | PRN

## 2022-05-27 MED ORDER — AMISULPRIDE (ANTIEMETIC) 5 MG/2ML IV SOLN: 10.0000 mg | Freq: Once | INTRAVENOUS | Status: DC | PRN

## 2022-05-27 MED ORDER — DEXAMETHASONE SODIUM PHOSPHATE 10 MG/ML IJ SOLN
INTRAMUSCULAR | Status: AC
  Filled 2022-05-27: qty 1

## 2022-05-27 MED ORDER — FENTANYL CITRATE (PF) 100 MCG/2ML IJ SOLN
INTRAMUSCULAR | Status: DC | PRN
  Administered 2022-05-27 (×2): 25 ug via INTRAVENOUS

## 2022-05-27 MED ORDER — CHLORHEXIDINE GLUCONATE 0.12 % MT SOLN
15.0000 mL | Freq: Once | OROMUCOSAL | Status: AC
  Administered 2022-05-27: 15 mL via OROMUCOSAL

## 2022-05-27 MED ORDER — OXYBUTYNIN CHLORIDE 5 MG PO TABS: 5.0000 mg | ORAL_TABLET | Freq: Three times a day (TID) | ORAL | 1 refills | Status: DC | PRN

## 2022-05-27 SURGICAL SUPPLY — 21 items
BAG URO CATCHER STRL LF (MISCELLANEOUS) ×1 IMPLANT
BASKET ZERO TIP NITINOL 2.4FR (BASKET) IMPLANT
BSKT STON RTRVL ZERO TP 2.4FR (BASKET) ×1
CATH URETL OPEN 5X70 (CATHETERS) ×1 IMPLANT
CLOTH BEACON ORANGE TIMEOUT ST (SAFETY) ×1 IMPLANT
EXTRACTOR STONE NITINOL NGAGE (UROLOGICAL SUPPLIES) IMPLANT
GLOVE SURG LX STRL 7.5 STRW (GLOVE) ×1 IMPLANT
GOWN SRG XL LVL 4 BRTHBL STRL (GOWNS) ×1 IMPLANT
GOWN STRL NON-REIN XL LVL4 (GOWNS) ×1
GUIDEWIRE STR DUAL SENSOR (WIRE) IMPLANT
GUIDEWIRE ZIPWRE .038 STRAIGHT (WIRE) ×1 IMPLANT
KIT TURNOVER KIT A (KITS) ×1 IMPLANT
LASER FIB FLEXIVA PULSE ID 365 (Laser) IMPLANT
MANIFOLD NEPTUNE II (INSTRUMENTS) ×1 IMPLANT
PACK CYSTO (CUSTOM PROCEDURE TRAY) ×1 IMPLANT
SHEATH NAVIGATOR HD 11/13X36 (SHEATH) IMPLANT
STENT URET 6FRX26 CONTOUR (STENTS) IMPLANT
TRACTIP FLEXIVA PULS ID 200XHI (Laser) IMPLANT
TRACTIP FLEXIVA PULSE ID 200 (Laser) ×1
TUBING CONNECTING 10 (TUBING) ×1 IMPLANT
TUBING UROLOGY SET (TUBING) ×1 IMPLANT

## 2022-05-27 NOTE — H&P (Addendum)
Office Visit Report     05/18/2022   --------------------------------------------------------------------------------   [name]  MRN: [id number]  DOB: 1945/12/21, 76 year old Male  SSN:    PRIMARY CARE:  C Melinda Crutch, MD  REFERRING:  Joyice Faster. Deterding, MD  PROVIDER:  Ellison Hughs, M.D.  TREATING:  Jiles Crocker, NP  LOCATION:  [facility] [phone]     --------------------------------------------------------------------------------   CC/HPI: CC: Kidney stones   HPI: [name] is a 76 year old male, previously followed by Dr. Alyson Ingles, with a history of microscopic hematuria, erectile dysfunction, CKD IV, basal cell carcinoma, hypogonadism, A-fib, CVA in 2022, mitral valve replacement, aortic valve replacement and CAD (s/p CABG in the 1990s).   Last PSA: 2.44 (03/2021), 4.58 (01/2020). No personal/family history of prostate cancer. No prior prostate biopsy.   03/01/20: The patient is here today for a routine follow-up and cystoscopy. Recent CT showed two non-obstructing right renal stones with no other GU abnormalities. He is voiding w/o difficulty and denies interval episodes of hematuria or dysuria. UA today is clear.   01/28/2021: The patient is here today for right stent removal following ureteroscopy with Dr. Diona Fanti on 01/08/2021 to address his obstructing right mid ureteral calculus. He has recovered well since surgery and denies residual flank pain, interval stone passage or fever/chills. He did have approximately 5 days of gross hematuria following his operation, but it is since resolved. Over the spring, the patient reports that he has had multiple bouts with COVID and is suffering from chronic fatigue.   03/31/21: The patient is here today for a routine follow-up and renal US following ureteroscopy in May. 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 From a urinary standpoint, he is  voiding without difficulty and denies dysuria or residual hematuria.   01/15/22: The patient is here to discuss treatment of hypogonadism. He recently had a total testosterone level of 189 in 11/2021 and reports worsening fatigue for the past several months. He was previously treated with [facility] by the [facility] for several years, but states that they stopped providing the medication due to his PSA being elevated in 2021. He also notes persistent ED despite tri-mix--he is interested in an IPP.   03/27/22: The patient is here today to get a refill of his Edex. He was started on topical testosterone at his last visit and notes significant improvement in his energy and libido. He denies any associated side effects. His recent testosterone level came back at 1500 and I instructed him to reduce his dose to 1 pump daily. He has repeat labs pending in November.   05/18/2022: [name] noted hx of nephrolithiasis and CKD who presented to the ER with a 3 day history of right flank pain. The pain was sharp, intermittent, moderate to severe and nonradiating. He had associated nausea but no vomiting. He has had decreased urine output for the past 2 days. Creatinine on presentation was 5.4 up from a baseline of 3-3.2. CT in the ER shows a 7-11m right proximal ureteral calculus with moderate hydronephrosis. His flank pain has significantly improved with narcotics. Creatinine did not improve with IV hydration. Dr MAlyson Inglesconsulted and pt was taken to the OR on 08/19 for urgent stent placement. Now back today for preoperative appointment prior to undergoing definitive ureteroscopy with Dr. WJackson Latinostricture. During his hospitalization he had an exacerbation of chronic A-fib and underwent TEE cardioversion under the guidance of cardiology. Patient  continues on Eliquis. Cardiology has given clearance for him to discontinue Eliquis prior to the upcoming procedure. Creatinine has gradually trended down, last assessed it was 4.1 in the  setting of known chronic kidney disease. Today doing well, he is not having any acute focal chest pain, palpitations or shortness of breath. He is having some expected irritative voiding symptoms with frequency/urgency but denies any significant dysuria or gross hematuria. He is not having any right-sided pain or discomfort suggestive of obstructive uropathy. Tolerating a normal diet without nausea or vomiting, he denies interval fevers or chills.     ALLERGIES: codeine Statins    MEDICATIONS: Androgel 20.25 mg/1.25 gram per actuation (1.62 %) gel in metered-dose pump 75 gram Transdermal Daily As Directed Apply 2 pumps to each shoulder once daily  Cialis 5 mg tablet  Stendra 200 mg tablet 1 tablet PO PRN  Triple Mix 0.2 ml Intracavernosal PRN PGE 5mg Pap 310mPhent 82m66mZyrtec 10 mg capsule  Calcitriol  Carvedilol 6.25 mg tablet  Edex 20 mcg kit Inject 20 mcg intracavernosally one hour prior to intercourse  Eliquis 5 mg tablet  Furosemide 40 mg tablet  Hydrocodone-Acetaminophen 5 mg-325 mg tablet  Nitrostat  Potassium  Ranexa 500 mg tablet, extended release 12 hr  Sildenafil Citrate 100 mg tablet 1 tablet PO Daily PRN  Tadalafil 20 mg tablet 1 tablet PO Daily PRN  Trazodone Hcl 150 mg tablet     GU PSH: Circumcision - 1970 Cysto Remove Stent FB Sim - 01/28/2021 Cystoscopy - 2021       PSH Notes: stent for kidney stones September 2023   NON-GU PSH: Coronary Artery Bypass Grafting Hernia Repair Knee replacement - 2003 Lap, Place Gastr Adjust Band - 2010     GU PMH: ED due to arterial insufficiency - 03/27/2022, - 2020 Primary hypogonadism - 03/27/2022, - 01/15/2022 Atrophy of kidney - 03/31/2021 Renal calculus - 03/31/2021, Two, 5 mm non-obstructing right renal stones , - 2021 Elevated PSA - 01/28/2021, - 2021 Ureteral calculus - 01/28/2021 Other male ED - 2021 Other microscopic hematuria - 2020 Kidney Failure Unspec    NON-GU PMH: Acute right heart failure Atrial  Fibrillation Depression Diabetes Type 2 GERD Heart disease, unspecified Hypercholesterolemia Hypertension Sleep Apnea    FAMILY HISTORY: Dementia - Mother Diabetes - Sister, Brother Hypertension - Father stroke - Father   SOCIAL HISTORY: Marital Status: Married Preferred Language: English; Ethnicity: Not Hispanic Or Latino; Race: White Drinks 1 drink per week.  Does not use drugs. Drinks 1 caffeinated drink per day. 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 Patient denies burning/ pain with urination, leakage of urine, stream starts and stops, trouble starting your stream, have to strain to urinate , erection problems, and penile pain.  Gastrointestinal (Upper):   Patient denies nausea, vomiting, and indigestion/ heartburn.  Gastrointestinal (Lower):   Patient denies diarrhea and constipation.  Constitutional:   Patient reports fatigue. Patient denies fever, night sweats, and weight loss.  Skin:   Patient denies skin rash/ lesion and itching.  Eyes:   Patient denies blurred vision and double vision.  Ears/ Nose/ Throat:   Patient denies sore throat and sinus problems.  Hematologic/Lymphatic:   Patient denies swollen glands and easy bruising.  Cardiovascular:   Patient denies leg swelling and chest pains.  Respiratory:   Patient denies cough and  shortness of breath.  Endocrine:   Patient denies excessive thirst.  Musculoskeletal:   Patient reports back pain. Patient denies joint pain.  Neurological:   Patient denies headaches and dizziness.  Psychologic:   Patient denies anxiety and depression.   VITAL SIGNS:      05/18/2022 11:02 AM  BP 106/58 mmHg  Pulse 60 /min  Temperature 98.2 F / 36.7 C   MULTI-SYSTEM PHYSICAL EXAMINATION:    Constitutional: Well-nourished. No physical deformities. Normally developed. Good grooming.  Neck:  Neck symmetrical, not swollen. Normal tracheal position.  Respiratory: No labored breathing, no use of accessory muscles.   Cardiovascular: Normal temperature, normal extremity pulses, no swelling, no varicosities.  Skin: No paleness, no jaundice, no cyanosis. No lesion, no ulcer, no rash.  Neurologic / Psychiatric: Oriented to time, oriented to place, oriented to person. No depression, no anxiety, no agitation.  Gastrointestinal: Obese abdomen. No mass, no tenderness, no rigidity.   Musculoskeletal: Normal gait and station of head and neck.     Complexity of Data:  Source Of History:  Patient, Medical Record Summary  Lab Test Review:   CBC with Diff, CMP  Records Review:   Previous Doctor Records, Previous Hospital Records, Previous Patient Records  Urine Test Review:   Urinalysis, Urine Culture  X-Ray Review: C.T. Abdomen/Pelvis: Reviewed Films. Reviewed Report.     03/31/21 01/26/20  PSA  Total PSA 2.44 ng/mL 4.58 ng/dl    02/26/22  Hormones  Testosterone, Total 1500.0 ng/dL    05/18/22  Urinalysis  Urine Appearance Slightly Cloudy   Urine Color Yellow   Urine Glucose Neg mg/dL  Urine Bilirubin Neg mg/dL  Urine Ketones Neg mg/dL  Urine Specific Gravity 1.020   Urine Blood 3+ ery/uL  Urine pH 5.5   Urine Protein Trace mg/dL  Urine Urobilinogen 0.2 mg/dL  Urine Nitrites Neg   Urine Leukocyte Esterase Trace leu/uL  Urine WBC/hpf 6 - 10/hpf   Urine RBC/hpf 40 - 60/hpf   Urine Epithelial Cells 0 - 5/hpf   Urine Bacteria Few (10-25/hpf)   Urine Mucous Not Present   Urine Yeast NS (Not Seen)   Urine Trichomonas Not Present   Urine Cystals NS (Not Seen)   Urine Casts NS (Not Seen)   Urine Sperm Not Present   Notes:                     CLINICAL DATA: RLQ abdominal pain (Age >= 14y) rlq abd pain, acute   renal failure     EXAM:   CT ABDOMEN AND PELVIS WITHOUT CONTRAST     TECHNIQUE:   Multidetector CT imaging of the abdomen and pelvis was performed   following  the standard protocol without IV contrast.     RADIATION DOSE REDUCTION: This exam was performed according to the   departmental dose-optimization program which includes automated   exposure control, adjustment of the mA and/or kV according to   patient size and/or use of iterative reconstruction technique.     COMPARISON: 01/07/2021     FINDINGS:   Lower chest: No acute abnormality. Coronary artery and aortic   calcifications. Calcified granuloma in the right lower lung.     Hepatobiliary: Small layering gallstones in the gallbladder. No   biliary ductal dilatation or focal hepatic abnormality.     Pancreas: No focal abnormality or ductal dilatation.     Spleen: No focal abnormality. Normal size.     Adrenals/Urinary Tract: 3 mm right midpole renal stone. Mild  right   hydronephrosis due to 2 8 mm mid right ureteral stone. No   hydronephrosis or stones on the left. Adrenal glands and urinary   bladder unremarkable.     Stomach/Bowel: Colonic diverticulosis. No active diverticulitis.   Normal appendix. Stomach and small bowel unremarkable. Lap band   device in place, grossly unremarkable.     Vascular/Lymphatic: Aortic atherosclerosis. No evidence of aneurysm   or adenopathy.     Reproductive: No visible focal abnormality.     Other: No free fluid or free air.     Musculoskeletal: No acute bony abnormality.     IMPRESSION:   8 mm mid right ureteral stone with mild right hydronephrosis.     Right nephrolithiasis.     Cholelithiasis.     Aortic atherosclerosis.     Colonic diverticulosis.       Electronically Signed   By: Rolm Baptise M.D.   On: 04/10/2022 15:03   PROCEDURES:          Urinalysis w/Scope Dipstick Dipstick Cont'd Micro  Color: Yellow Bilirubin: Neg mg/dL WBC/hpf: 6 - 10/hpf  Appearance: Slightly Cloudy Ketones: Neg mg/dL RBC/hpf: 40 - 60/hpf  Specific Gravity: 1.020 Blood: 3+ ery/uL Bacteria:  Few (10-25/hpf)  pH: 5.5 Protein: Trace mg/dL Cystals: NS (Not Seen)  Glucose: Neg mg/dL Urobilinogen: 0.2 mg/dL Casts: NS (Not Seen)    Nitrites: Neg Trichomonas: Not Present    Leukocyte Esterase: Trace leu/uL Mucous: Not Present      Epithelial Cells: 0 - 5/hpf      Yeast: NS (Not Seen)      Sperm: Not Present    ASSESSMENT:      ICD-10 Details  1 GU:   Ureteral calculus - N20.1 Right, Acute, Complicated Injury   PLAN:           Orders Labs Urine Culture          Schedule Return Visit/Planned Activity: Keep Scheduled Appointment - Follow up MD, Schedule Surgery          Document Letter(s):  Created for Patient: Clinical Summary         Notes:   Not having colic pain, cardiovascular symptoms somewhat stable and he has received prior clearance to discontinue Eliquis prior to the upcoming procedure. He is having some expected irritative voiding symptoms with indwelling ureteral stent but managing well. All questions answered to the best of my ability regarding the upcoming procedure and expected postoperative course with understanding expressed by the patient. We sent a urine culture today to serve as a baseline. He will proceed with previously scheduled right ureteroscopy with Dr. [name] on 10/4.        Next Appointment:      Next Appointment: 05/27/2022 08:30 AM    Appointment Type: Surgery     Location: [facility] [phone]    Provider: Ellison Hughs, M.D.    Reason for Visit: WL/OP CYSTO, RT STENT EXCHANGE, URS , LL

## 2022-05-27 NOTE — Anesthesia Procedure Notes (Signed)
Procedure Name: LMA Insertion Date/Time: 05/27/2022 8:41 AM  Performed by: Sharlette Dense, CRNAPatient Re-evaluated:Patient Re-evaluated prior to induction Oxygen Delivery Method: Circle system utilized Preoxygenation: Pre-oxygenation with 100% oxygen Induction Type: IV induction LMA: LMA with gastric port inserted LMA Size: 4.0 Number of attempts: 1 Placement Confirmation: positive ETCO2 and breath sounds checked- equal and bilateral Dental Injury: Injury to lip

## 2022-05-27 NOTE — Op Note (Signed)
Operative Note  Preoperative diagnosis:  1.  8 mm right mid ureteral stone and 5 mm right renal stone  Postoperative diagnosis: Name  Procedure(s): 1.  Cystoscopy with right ureteral stent exchange, right ureteroscopy and right laser lithotripsy   Surgeon: Ellison Hughs, MD  Assistants:  None  Anesthesia:  General  Complications:  None  EBL: Less than 5 mL  Specimens: 1.  Right ureteral stone fragments 2.  Previously placed right ureteral stent was removed intact, inspected and discarded  Drains/Catheters: 1.  Right 6 French, 26 cm JJ stent without tether  Intraoperative findings:   8 mm right mid ureteral stone and 5 mm right renal stone.  Both stones fragmented well with laser lithotripsy No intravesical abnormalities  Indication:  [name] is a 76 y.o. male status post right ureteral stent with Dr. Alyson Ingles on 04/11/2022 due to an obstructing 8 mm right mid ureteral stone.  He is here today for definitive stone treatment.  He has been consented for the above procedures, voices understanding and wishes to proceed.  Description of procedure:  After informed consent was obtained, the patient was brought to the operating room and general LMA anesthesia was administered. The patient was then placed in the dorsolithotomy position and prepped and draped in the usual sterile fashion. A timeout was performed. A 23 French rigid cystoscope was then inserted into the urethral meatus and advanced into the bladder under direct vision. A complete bladder survey revealed no intravesical pathology.  His previously placed right ureteral stent was grasped his distal curl and retracted to the urethral meatus.  A Glidewire was then used to intubate the lumen of the stent was advanced up to the right renal pelvis, under fluoroscopic guidance.  The stent was then removed intact, inspected and discarded.  A semirigid ureteroscope was then advanced into the bladder and up the right ureter  where his mid ureteral calculus was identified.  A 200 m holmium laser was then used to fracture the stone into several smaller pieces that were then swept out of the lumen of the ureter with a 0 tip basket.  A flexible ureteroscope was then advanced of the right ureter to the right renal pelvis where his 5 mm stone was identified in a midpole calyx.  A 200 m holmium laser was then used to dust the stone into 2 mm or less fragments.  The flexible ureteroscope was then removed under direct vision, identifying no evidence of ureteral trauma or significant luminal stone burden.  The rigid cystoscope was then readvanced into the bladder and a 6 [place], 26 cm JJ stent was replaced into the right collecting system, confirming good placement via fluoroscopy.  The patient's bladder was drained.  He tolerated the procedure well and was transferred to the postanesthesia in stable condition.  Plan: Follow-up on 06/03/2022 for office cystoscopy and stent removal

## 2022-05-27 NOTE — Anesthesia Postprocedure Evaluation (Signed)
Anesthesia Post Note  Patient: [name]  Procedure(s) Performed: CYSTOSCOPY, URETEROSCOPY, HOLMIUM LASER, STENT EXCHANGE (Right)     Patient location during evaluation: PACU Anesthesia Type: General Level of consciousness: awake and alert Pain management: pain level controlled Vital Signs Assessment: post-procedure vital signs reviewed and stable Respiratory status: spontaneous breathing, nonlabored ventilation and respiratory function stable Cardiovascular status: blood pressure returned to baseline and stable Postop Assessment: no apparent nausea or vomiting Anesthetic complications: no   No notable events documented.  Last Vitals:  Vitals:   05/27/22 1000 05/27/22 1006  BP: 138/73 134/71  Pulse: (!) 56 (!) 58  Resp: 17 14  Temp:    SpO2: 100% 100%    Last Pain:  Vitals:   05/27/22 1006  TempSrc:   PainSc: 0-No pain                 Lynda Rainwater

## 2022-05-27 NOTE — Transfer of Care (Signed)
Immediate Anesthesia Transfer of Care Note  Patient: [name]  Procedure(s) Performed: CYSTOSCOPY, URETEROSCOPY, HOLMIUM LASER, STENT EXCHANGE (Right)  Patient Location: PACU  Anesthesia Type:General  Level of Consciousness: awake and alert   Airway & Oxygen Therapy: Patient Spontanous Breathing and Patient connected to face mask oxygen  Post-op Assessment: Report given to RN and Post -op Vital signs reviewed and stable  Post vital signs: Reviewed and stable  Last Vitals:  Vitals Value Taken Time  BP 131/68 05/27/22 0940  Temp    Pulse 62 05/27/22 0941  Resp 17 05/27/22 0941  SpO2 100 % 05/27/22 0941  Vitals shown include unvalidated device data.  Last Pain:  Vitals:   05/27/22 0650  TempSrc: Oral         Complications: No notable events documented.

## 2022-05-28 ENCOUNTER — Encounter (HOSPITAL_COMMUNITY): Payer: Self-pay | Admitting: Urology

## 2022-05-29 DIAGNOSIS — M542 Cervicalgia: Secondary | ICD-10-CM | POA: Diagnosis not present

## 2022-05-29 DIAGNOSIS — M25512 Pain in left shoulder: Secondary | ICD-10-CM | POA: Diagnosis not present

## 2022-06-03 DIAGNOSIS — N201 Calculus of ureter: Secondary | ICD-10-CM | POA: Diagnosis not present

## 2022-06-05 DIAGNOSIS — M546 Pain in thoracic spine: Secondary | ICD-10-CM | POA: Diagnosis not present

## 2022-06-05 DIAGNOSIS — M5414 Radiculopathy, thoracic region: Secondary | ICD-10-CM | POA: Diagnosis not present

## 2022-06-09 DIAGNOSIS — M791 Myalgia, unspecified site: Secondary | ICD-10-CM | POA: Diagnosis not present

## 2022-06-09 DIAGNOSIS — M47814 Spondylosis without myelopathy or radiculopathy, thoracic region: Secondary | ICD-10-CM | POA: Diagnosis not present

## 2022-06-11 DIAGNOSIS — N200 Calculus of kidney: Secondary | ICD-10-CM | POA: Diagnosis not present

## 2022-06-11 DIAGNOSIS — E1122 Type 2 diabetes mellitus with diabetic chronic kidney disease: Secondary | ICD-10-CM | POA: Diagnosis not present

## 2022-06-11 DIAGNOSIS — M109 Gout, unspecified: Secondary | ICD-10-CM | POA: Diagnosis not present

## 2022-06-11 DIAGNOSIS — N185 Chronic kidney disease, stage 5: Secondary | ICD-10-CM | POA: Diagnosis not present

## 2022-06-11 DIAGNOSIS — I120 Hypertensive chronic kidney disease with stage 5 chronic kidney disease or end stage renal disease: Secondary | ICD-10-CM | POA: Diagnosis not present

## 2022-06-11 DIAGNOSIS — D631 Anemia in chronic kidney disease: Secondary | ICD-10-CM | POA: Diagnosis not present

## 2022-06-11 DIAGNOSIS — I509 Heart failure, unspecified: Secondary | ICD-10-CM | POA: Diagnosis not present

## 2022-06-11 DIAGNOSIS — I4891 Unspecified atrial fibrillation: Secondary | ICD-10-CM | POA: Diagnosis not present

## 2022-06-11 DIAGNOSIS — N2581 Secondary hyperparathyroidism of renal origin: Secondary | ICD-10-CM | POA: Diagnosis not present

## 2022-06-15 DIAGNOSIS — M47812 Spondylosis without myelopathy or radiculopathy, cervical region: Secondary | ICD-10-CM | POA: Diagnosis not present

## 2022-06-22 ENCOUNTER — Encounter (HOSPITAL_COMMUNITY): Payer: Self-pay | Admitting: Physician Assistant

## 2022-06-22 ENCOUNTER — Encounter (HOSPITAL_COMMUNITY): Payer: Self-pay

## 2022-06-22 ENCOUNTER — Ambulatory Visit (HOSPITAL_COMMUNITY)
Admission: RE | Admit: 2022-06-22 | Discharge: 2022-06-22 | Disposition: A | Discharge status: Home / Self Care | Payer: Medicare Other | Source: Ambulatory Visit | Attending: Physician Assistant | Admitting: Physician Assistant

## 2022-06-22 VITALS — BP 136/72 | HR 75 | Ht 69.0 in | Wt 193.6 lb

## 2022-06-22 DIAGNOSIS — Z8616 Personal history of COVID-19: Secondary | ICD-10-CM | POA: Insufficient documentation

## 2022-06-22 DIAGNOSIS — E1122 Type 2 diabetes mellitus with diabetic chronic kidney disease: Secondary | ICD-10-CM | POA: Diagnosis not present

## 2022-06-22 DIAGNOSIS — N189 Chronic kidney disease, unspecified: Secondary | ICD-10-CM | POA: Diagnosis not present

## 2022-06-22 DIAGNOSIS — Z951 Presence of aortocoronary bypass graft: Secondary | ICD-10-CM | POA: Diagnosis not present

## 2022-06-22 DIAGNOSIS — I2510 Atherosclerotic heart disease of native coronary artery without angina pectoris: Secondary | ICD-10-CM | POA: Diagnosis not present

## 2022-06-22 DIAGNOSIS — I4892 Unspecified atrial flutter: Secondary | ICD-10-CM | POA: Insufficient documentation

## 2022-06-22 DIAGNOSIS — Z7901 Long term (current) use of anticoagulants: Secondary | ICD-10-CM | POA: Insufficient documentation

## 2022-06-22 DIAGNOSIS — I4819 Other persistent atrial fibrillation: Secondary | ICD-10-CM

## 2022-06-22 DIAGNOSIS — I4719 Other supraventricular tachycardia: Secondary | ICD-10-CM | POA: Diagnosis not present

## 2022-06-22 DIAGNOSIS — Z794 Long term (current) use of insulin: Secondary | ICD-10-CM | POA: Diagnosis not present

## 2022-06-22 DIAGNOSIS — G4733 Obstructive sleep apnea (adult) (pediatric): Secondary | ICD-10-CM | POA: Insufficient documentation

## 2022-06-22 DIAGNOSIS — D6869 Other thrombophilia: Secondary | ICD-10-CM | POA: Diagnosis not present

## 2022-06-22 DIAGNOSIS — I130 Hypertensive heart and chronic kidney disease with heart failure and stage 1 through stage 4 chronic kidney disease, or unspecified chronic kidney disease: Secondary | ICD-10-CM | POA: Diagnosis not present

## 2022-06-22 DIAGNOSIS — E669 Obesity, unspecified: Secondary | ICD-10-CM | POA: Insufficient documentation

## 2022-06-22 DIAGNOSIS — I5032 Chronic diastolic (congestive) heart failure: Secondary | ICD-10-CM | POA: Insufficient documentation

## 2022-06-22 NOTE — Progress Notes (Signed)
Primary Care Physician: [name], MD Referring Physician: [place] Cardiologist: Dr. Aundra Dubin  EP: Dr Ricki Miller is a 76 y.o. male with a h/o CKD, CAD s/p CABG and obesity s/p lap banding. In 2017, he developed increased exertional dyspnea.  Eventually had a TEE showing severe MR with prolapse of a portion of the anterior mitral valve leaflet.  Coronary angiography in 6/17 showed patent grafts and 90% stenosis of distal diagonal not amenable to intervention.  In 8/17, he had mitral valve repair.  Post-op diastolic CHF, Lasix started and increased.   He developed atrial arrhythmias in 12/18.  Both a. fibrillation and flutter were seen. Saw Dr. Rayann Heman, recommended DCCV,   amiodarone or sotalol if fibrillation recurred shortly after cardioversion.  He had DCCV in 1/19 and  has been in [place] for the last 2 years.Marland Kitchen   He is now in afib clinic as he was seen in the [place] yesterday and was noted to be in rate controlled afib. Was referred here. He continues in rate controlled afib today. He was unaware that he was out of rhythm, but now looking back, he feels that he has had fatigue for around 2 weeks.He does not know of an trigger other than  has had some issues with gout. No  missed anticoagulation for at least 3 weeks.  F/u in afib clinic, 08/2819. He had a successful cardioversion and felt well for the day, then started feeling worse the next day. He is back in rate controlled afib.  F/u in afib clinic, 10/12/19. He has been loading on amiodarone 200 mg bid. He remians in afib with CVR so  will plan for cardioversion. He states no missed doses of eliquis  5 mg bid for the last 3 weeks. He feels very fatigued in afib.   Follow up in the AF clinic 04/08/22. Patient reports that 04/03/22 he noted increased fatigue with exertion. He and his wife also noticed an irregular heart beat. ECG today shows rate controlled afib. There were no specific triggers that he could identify.   Follow  up in the AF clinic 06/22/22. Patient is s/p TEE guided DCCV 04/14/22 during a hospitalization for an obstructing kidney stone. He reports that he has done well since that time with no further afib. No bleeding issues on anticoagulation.   Today, he denies symptoms of palpitations, chest pain, shortness of breath, orthopnea, PND, lower extremity edema, dizziness, presyncope, syncope, or neurologic sequela. The patient is tolerating medications without difficulties and is otherwise without complaint today.   Past Medical History:  Diagnosis Date   Allergic rhinitis    Anxiety    Atrial fibrillation (HCC)    Cancer (HCC)    hx of skin cancers    Chronic kidney disease    Coronary artery disease    a. s/p MI & CABG; b. s/p PCI Diag;  c. Cath 12/2011: LAD diff dzs/small, Diag 75% isr, 90 dist to stent (small), LCX & RCA occluded, VG->OM 40, VG->PDA patent - med rx.   COVID-19    x 3   Depression    PTSD   Diabetes mellitus    not on medications   Diastolic CHF, chronic ([place])    a. EF 55-60% by echo 2007   GERD (gastroesophageal reflux disease)    "not anymore" " I had a lap band"   History of diverticulitis of colon    5 YRS AGO   History of kidney stones  History of pneumonia    2017   History of transient ischemic attack (TIA)    CAROTID DOPPLERS NOV 2011  0-39& BIL. STENOSIS   Hyperlipidemia    Hypertension    Kidney failure    Mitral regurgitation    Myocardial infarction [facility])    Neuromuscular disorder (HCC)    left arm numbness    OA (osteoarthritis)    RIGHT KNEE ARTHOFIBROSIS W/ PAIN  (S/P  REPLACEMENT 2004)   Pneumonia    PTSD (post-traumatic stress disorder)    from [place] since 1973   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/25/2016   Complex valvuloplasty including artificial Gore-tex neochord placement x4, plication of anterior commissure, and 26 mm Sorin Memo 3D ring annuloplasty via right mini thoracotomy approach   Shortness of breath dyspnea    with  exertion   Skin cancer    Sleep apnea    cpap- see ov note in EPIC 05/12/11 for settings    Stroke [facility])    hx of   Stroke [facility])    Past Surgical History:  Procedure Laterality Date   AV FISTULA PLACEMENT Left 08/02/2018   Procedure: ARTERIOVENOUS (AV) FISTULA CREATION RADIOCEPHALIC;  Surgeon: Angelia Mould, MD;  Location: [place];  Service: Vascular;  Laterality: Left;   BLEPHAROPLASTY  [place]  2001, 2004, 2009, 2011   CARDIAC CATHETERIZATION N/A 01/23/2016   Procedure: Right/Left Heart Cath and Coronary Angiography;  Surgeon: [name], MD;  Location: [place] CV LAB;  Service: Cardiovascular;  Laterality: N/A;   CARDIOVERSION N/A 09/07/2017   Procedure: CARDIOVERSION;  Surgeon: [name], MD;  Location: [facility] ENDOSCOPY;  Service: Cardiovascular;  Laterality: N/A;   CARDIOVERSION N/A 09/13/2019   Procedure: CARDIOVERSION;  Surgeon: [name], MD;  Location: [facility] ENDOSCOPY;  Service: Cardiovascular;  Laterality: N/A;   CARDIOVERSION N/A 10/19/2019   Procedure: CARDIOVERSION;  Surgeon: [name], MD;  Location: [facility] ENDOSCOPY;  Service: Cardiovascular;  Laterality: N/A;   CARDIOVERSION N/A 04/14/2022   Procedure: CARDIOVERSION;  Surgeon: [name], MD;  Location: [facility] ENDOSCOPY;  Service: Cardiovascular;  Laterality: N/A;   CATARACT EXTRACTION W/ INTRAOCULAR LENS IMPLANT Bilateral    CHONDROPLASTY  07/22/2011   Procedure: CHONDROPLASTY;  Surgeon: [name];  Location: [place];  Service: Orthopedics;;   CIRCUMCISION  [place]-- POST CABG   W/ STENT, last cath 01/07/2012    CORONARY ARTERY BYPASS GRAFT  1995   X3 VESSEL   CORONARY ARTERY BYPASS GRAFT  1995   CORONARY STENT PLACEMENT     CYSTOSCOPY W/ URETERAL STENT PLACEMENT Right 04/11/2022   Procedure: CYSTOSCOPY WITH RETROGRADE PYELOGRAM/URETERAL STENT PLACEMENT;  Surgeon: Cleon Gustin,  MD;  [facility];  Service: Urology;  Laterality: Right;   CYSTOSCOPY/URETEROSCOPY/HOLMIUM LASER/STENT PLACEMENT Right 01/08/2021   Procedure: CYSTOSCOPY/RETROGRADE/URETEROSCOPY/HOLMIUM LASER/STENT PLACEMENT;  Surgeon: Franchot Gallo, MD;  Location[facility];  Service: Urology;  Laterality: Right;   CYSTOSCOPY/URETEROSCOPY/HOLMIUM LASER/STENT PLACEMENT Right 05/27/2022   Procedure: CYSTOSCOPY, URETEROSCOPY, HOLMIUM LASER, STENT EXCHANGE;  Surgeon: Ceasar Mons, MD;  Location[facility];  Service: Urology;  Laterality: Right;   INCISION AND DRAINAGE PERIRECTAL ABSCESS N/A 09/01/2021   Procedure: IRRIGATION AND DEBRIDEMENT PERIRECTAL ABSCESS;  Surgeon: Kieth Brightly Arta Bruce, MD;  Location[facility];  Service: General;  Laterality: N/A;   IR PERC TUN PERIT CATH WO PORT S&I [place]a [place]  03/06/2021  IR REMOVAL TUN CV CATH W/O FL  04/14/2021   KNEE ARTHROSCOPY  07/22/2011   Procedure: ARTHROSCOPY KNEE;  Surgeon: [name];  Location: [place];  Service: Orthopedics;  Laterality: Right;  WITH DEBRIDEMENT    KNEE ARTHROSCOPY W/ MENISCECTOMY  X2 IN 2002-- RIGHT KNEE   LAPAROSCOPIC GASTRIC BANDING  01-15-09   LEFT HEART CATH AND CORONARY ANGIOGRAPHY N/A 04/29/2017   Procedure: LEFT HEART CATH AND CORONARY ANGIOGRAPHY;  Surgeon: [name], MD;  Location: [place] CV LAB;  Service: Cardiovascular;  Laterality: N/A;   LEFT HEART CATHETERIZATION WITH CORONARY ANGIOGRAM N/A 09/11/2013   Procedure: LEFT HEART CATHETERIZATION WITH CORONARY ANGIOGRAM;  Surgeon: [name], MD;  Location: [facility] CATH LAB;  Service: Cardiovascular;  Laterality: N/A;   MITRAL VALVE REPAIR Right 03/25/2016   Procedure: MINIMALLY INVASIVE REOPERATION FOR MITRAL VALVE REPAIR  (MVR) with size 26 Sorin Memo 3D;  Surgeon: [name], MD;  Location: [place];  Service: Open Heart Surgery;  Laterality: Right;   MITRAL VALVE REPAIR  03/2016   RIGHT KNEE ED COMPARTMENT REPLACEMENT  2004   SYNOVECTOMY   07/22/2011   Procedure: SYNOVECTOMY;  Surgeon: Dione Plover Aluisio;  Location: [place];  Service: Orthopedics;;   TEE WITHOUT CARDIOVERSION N/A 01/23/2016   Procedure: TRANSESOPHAGEAL ECHOCARDIOGRAM (TEE);  Surgeon: [name], MD;  Location: [place];  Service: Cardiovascular;  Laterality: N/A;   TEE WITHOUT CARDIOVERSION N/A 03/25/2016   Procedure: TRANSESOPHAGEAL ECHOCARDIOGRAM (TEE);  Surgeon: [name], MD;  Location: [place];  Service: Open Heart Surgery;  Laterality: N/A;   TEE WITHOUT CARDIOVERSION N/A 03/04/2021   Procedure: TRANSESOPHAGEAL ECHOCARDIOGRAM (TEE);  Surgeon: Lelon Perla, MD;  Location: [facility] ENDOSCOPY;  Service: Cardiovascular;  Laterality: N/A;   TEE WITHOUT CARDIOVERSION N/A 04/14/2022   Procedure: TRANSESOPHAGEAL ECHOCARDIOGRAM (TEE);  Surgeon: [name], MD;  Location: [facility] ENDOSCOPY;  Service: Cardiovascular;  Laterality: N/A;   UMBILICAL HERNIA REPAIR  01-15-09   W/ GASTRIC BANDING PROCEDURE    Current Outpatient Medications  Medication Sig Dispense Refill   Alirocumab (PRALUENT) 75 MG/ML SOAJ Inject 75 mg into the skin every 14 (fourteen) days.     allopurinol (ZYLOPRIM) 100 MG tablet Take 50 mg by mouth 2 (two) times a week.     amiodarone (PACERONE) 200 MG tablet Take 1 tablet (200 mg total) by mouth in the morning.     apixaban (ELIQUIS) 5 MG TABS tablet Take 1 tablet (5 mg total) by mouth 2 (two) times daily. 180 tablet 3   busPIRone (BUSPAR) 10 MG tablet Take 5 mg by mouth 2 (two) times daily.     cetirizine (ZYRTEC) 10 MG tablet Take 10 mg by mouth in the morning.     colchicine 0.6 MG tablet Take 0.6-1.2 mg by mouth 2 (two) times daily as needed (gout).      Continuous Blood Gluc Sensor (DEXCOM G7 SENSOR) MISC 3 each by Does not apply route every 30 (thirty) days. Apply 1 sensor every 10 days 9 each 4   Cyanocobalamin (VITAMIN B-12) 5000 MCG TBDP Take 5,000 mcg by mouth 2 (two) times a week.     cycloSPORINE (RESTASIS) 0.05 %  ophthalmic emulsion Place 1 drop into both eyes 2 (two) times daily as needed (eye irritation.).     diclofenac Sodium (VOLTAREN) 1 % GEL Apply 2 g topically 3 (three) times daily as needed for pain.     ezetimibe (ZETIA) 10 MG tablet Take  1 tablet (10 mg total) by mouth daily. 90 tablet 3   fluticasone (FLONASE) 50 MCG/ACT nasal spray Place 1 spray into both nostrils daily as needed for allergies or rhinitis.     furosemide (LASIX) 40 MG tablet Take 40 mg by mouth See admin instructions. Take 1 tablet (40 mg) by mouth every other day alternating with 80 mg dosage     HYDROcodone-acetaminophen (NORCO/VICODIN) 5-325 MG tablet Take 1 tablet by mouth every 6 (six) hours as needed for moderate pain or severe pain. 12 tablet 0   methocarbamol (ROBAXIN) 500 MG tablet Take 500 mg by mouth 3 (three) times daily as needed for muscle spasms.     Multiple Vitamin (MULTIVITAMIN) capsule Take 1 capsule by mouth in the morning.     mupirocin ointment (BACTROBAN) 2 % Apply 1 Application topically daily as needed (irritation).     nitroGLYCERIN (NITROSTAT) 0.4 MG SL tablet 1 TAB UNDER THE TONGUE EVERY 5 MINUTES AS NEEDED FOR CHEST PAIN UP TO 3 DOSES, IF PERSIST CALL 911 25 tablet 3   ONETOUCH VERIO test strip 1 each daily.     oxybutynin (DITROPAN) 5 MG tablet Take 1 tablet (5 mg total) by mouth every 8 (eight) hours as needed for bladder spasms. 30 tablet 1   pantoprazole (PROTONIX) 40 MG tablet Take 40 mg by mouth daily as needed (heartburn).     phenazopyridine (PYRIDIUM) 200 MG tablet Take 1 tablet (200 mg total) by mouth 3 (three) times daily as needed (for pain with urination). 30 tablet 0   potassium chloride SA (KLOR-CON M) 20 MEQ tablet Take 20 mEq by mouth daily.     Semaglutide,0.25 or 0.'5MG'$ /DOS, (OZEMPIC, 0.25 OR 0.5 MG/DOSE,) 2 MG/1.5ML SOPN Inject 0.5 mg into the skin once a week. 4.5 mL 3   traZODone (DESYREL) 100 MG tablet Take 100 mg by mouth at bedtime.     No current facility-administered  medications for this encounter.    Allergies  Allergen Reactions   Crestor [Rosuvastatin] Other (See Comments)    Muscle aches    Lipitor [Atorvastatin] Other (See Comments)    Muscle aches   Pravastatin Other (See Comments)    Muscle aches   Carvedilol Other (See Comments)    loss of appetite   Metformin Diarrhea   Serotonin Reuptake Inhibitors (Ssris) Other (See Comments)    Muscle aches   Zocor [Simvastatin] Other (See Comments)     Muscle pain   Codeine Itching and Other (See Comments)    Extremity tingling-- can take synthetic    Social History   Socioeconomic History   Marital status: Married    Spouse name: [name]   Number of children: 1   Years of education: Not on file   Highest education level: Master's degree (e.g., MA, MS, MEng, MEd, MSW, MBA)  Occupational History    Comment: Chief Financial Officer retired  Tobacco Use   Smoking status: Former    Types: Cigarettes   Smokeless tobacco: Never   Tobacco comments:    Former smoke 04/08/22  Vaping Use   Vaping Use: Never used  Substance and Sexual Activity   Alcohol use: Not Currently    Alcohol/week: 2.0 standard drinks of alcohol    Types: 1 Glasses of wine, 1 Shots of liquor per week    Comment: OCCASIONAL   Drug use: Never   Sexual activity: Not Currently  Other Topics Concern   Not on file  Social History Narrative   Lives with wife  occas soda   Social Determinants of Health   Financial Resource Strain: Not on file  Food Insecurity: No Food Insecurity (03/27/2021)   Hunger Vital Sign    Worried About Running Out of Food in the Last Year: Never true    Ran Out of Food in the Last Year: Never true  Transportation Needs: No Transportation Needs (04/15/2022)   PRAPARE - Hydrologist (Medical): No    Lack of Transportation (Non-Medical): No  Physical Activity: Not on file  Stress: Not on file  Social Connections: Not on file  Intimate Partner Violence: Not on file     Family History  Problem Relation Age of Onset   Other Mother        joint problems   Alzheimer's disease Mother    Hypertension Father    Heart disease Father    CVA Father        age 82   Depression Father    Heart disease Sister        CABG   Stroke Sister    Heart failure Sister        congestive   Diabetes Sister    Heart disease Brother    Hypertension Brother    Congestive Heart Failure Brother     ROS- All systems are reviewed and negative except as per the HPI above  Physical Exam: Vitals:   06/22/22 1142  BP: 136/72  Pulse: 75  Weight: 87.8 kg  Height: '5\' 9"'$  (1.753 m)    Wt Readings from Last 3 Encounters:  06/22/22 87.8 kg  05/27/22 87.1 kg  05/19/22 87.1 kg    Labs: Lab Results  Component Value Date   NA 138 05/19/2022   K 4.1 05/19/2022   CL 104 05/19/2022   CO2 26 05/19/2022   GLUCOSE 125 (H) 05/19/2022   BUN 49 (H) 05/19/2022   CREATININE 4.06 (H) 05/19/2022   CALCIUM 9.9 05/19/2022   PHOS 3.7 01/10/2021   MG 2.0 01/10/2021   Lab Results  Component Value Date   INR 1.6 (H) 02/01/2021   Lab Results  Component Value Date   CHOL 94 04/21/2022   HDL 50 04/21/2022   LDLCALC 30 04/21/2022   TRIG 69 04/21/2022     GEN- The patient is a well appearing elderly male, alert and oriented x 3 today.   HEENT-head normocephalic, atraumatic, sclera clear, conjunctiva pink, hearing intact, trachea midline. Lungs- Clear to ausculation bilaterally, normal work of breathing Heart- Regular rate and rhythm, no murmurs, rubs or gallops  GI- soft, NT, ND, + BS Extremities- no clubbing, cyanosis, or edema MS- no significant deformity or atrophy Skin- no rash or lesion Psych- euthymic mood, full affect Neuro- strength and sensation are intact   EKG- SR, RBBB, LAFB Vent. rate 75 BPM PR interval 206 ms QRS duration 158 ms QT/QTcB 436/486 ms   Echo 04/11/21  1. Left ventricular ejection fraction, by estimation, is 55 to 60%. The  left  ventricle has normal function. The left ventricle has no regional  wall motion abnormalities. There is moderate asymmetric left ventricular hypertrophy of the basal-septal segment. Left ventricular diastolic parameters are indeterminate.   2. Right ventricular systolic function is normal. The right ventricular  size is normal. There is normal pulmonary artery systolic pressure.   3. Left atrial size was severely dilated.   4. Mean mitral valve gradient is 8 mmHg compared with 12 mmHg 02/2021. There is a filamentous, mobile density on  the left atrial side of the mitral annulus. Previously noted on TEE 02/2021. No significant change. Likely an annuloplasty suture. The mitral valve has been repaired/replaced. Mild mitral valve regurgitation. Mild to moderate mitral stenosis. There is a 26 mm prosthetic annuloplasty ring  present in the mitral position. Procedure Date: 03/25/2016.   5. The aortic valve is tricuspid. There is mild calcification of the  aortic valve. There is mild thickening of the aortic valve. Aortic valve  regurgitation is not visualized. No aortic stenosis is present.   6. Aortic dilatation noted. There is mild dilatation of the ascending  aorta, measuring 39 mm.   7. The inferior vena cava is normal in size with greater than 50%  respiratory variability, suggesting right atrial pressure of 3 mmHg.   8. Evidence of atrial level shunting detected by color flow Doppler.  There is a large patent foramen ovale with predominantly left to right  shunting across the atrial septum.    CHA2DS2-VASc Score = 8  The patient's score is based upon: CHF History: 1 HTN History: 1 Diabetes History: 1 Stroke History: 2 Vascular Disease History: 1 Age Score: 2 Gender Score: 0       ASSESSMENT AND PLAN: 1. Persistent Atrial Fibrillation/atrial flutter/atrial tachycardia The patient's CHA2DS2-VASc score is 8, indicating a 10.8% annual risk of stroke.   S/p DCCV on 04/14/22 Patient appears  to be maintaining SR.  Continue amiodarone 200 mg daily, will check cmet/tsh on follow up.  Continue Eliquis 5 mg BID  2. Secondary Hypercoagulable State (ICD10:  D68.69) The patient is at significant risk for stroke/thromboembolism based upon his CHA2DS2-VASc Score of 8.  Continue Apixaban (Eliquis).   3. Chronic HFpEF Followed in [facility] Fluid status appears stable today.  4. CAD  S/p CABG On Praluent  No anginal symptoms.  5. MR S/p mitral valve repair 2017  6. OSA Encouraged compliance with CPAP therapy.  7. HTN Stable, no changes today.   Follow up in the [facility] per recall. AF clinic in 6 months.    [facility] [address] [place], [place] [address] [phone]

## 2022-06-26 ENCOUNTER — Telehealth: Payer: Self-pay

## 2022-06-26 NOTE — Telephone Encounter (Signed)
VMT pt requesting call back about re-referral to PREP

## 2022-07-06 DIAGNOSIS — Z85828 Personal history of other malignant neoplasm of skin: Secondary | ICD-10-CM | POA: Diagnosis not present

## 2022-07-06 DIAGNOSIS — L821 Other seborrheic keratosis: Secondary | ICD-10-CM | POA: Diagnosis not present

## 2022-07-06 DIAGNOSIS — L570 Actinic keratosis: Secondary | ICD-10-CM | POA: Diagnosis not present

## 2022-07-06 DIAGNOSIS — D0422 Carcinoma in situ of skin of left ear and external auricular canal: Secondary | ICD-10-CM | POA: Diagnosis not present

## 2022-07-06 DIAGNOSIS — D692 Other nonthrombocytopenic purpura: Secondary | ICD-10-CM | POA: Diagnosis not present

## 2022-07-06 DIAGNOSIS — D485 Neoplasm of uncertain behavior of skin: Secondary | ICD-10-CM | POA: Diagnosis not present

## 2022-07-10 ENCOUNTER — Telehealth: Payer: Self-pay

## 2022-07-10 NOTE — Telephone Encounter (Signed)
VMT pt requesting call back reference scheduling of intake for PREP starting on 07/21/22

## 2022-07-14 DIAGNOSIS — E291 Testicular hypofunction: Secondary | ICD-10-CM | POA: Diagnosis not present

## 2022-07-14 DIAGNOSIS — R948 Abnormal results of function studies of other organs and systems: Secondary | ICD-10-CM | POA: Diagnosis not present

## 2022-07-20 DIAGNOSIS — M25512 Pain in left shoulder: Secondary | ICD-10-CM | POA: Diagnosis not present

## 2022-08-03 NOTE — Progress Notes (Signed)
[facility] PREP Weekly Session  Patient Details  Name: [name] MRN: [id number] Date of Birth: 1945/12/27 Age: 76 y.o. PCP: [name], MD  Vitals:   07/28/22 1430  Weight: 192 lb (87.1 kg)     [facility] Weekly seesion - 08/03/22 0900       [facility] "PREP" Location   [facility]      Weekly Session   Topic Discussed Importance of resistance training;Other ways to be active    Minutes exercised this week 90 minutes    Classes attended to date [place] 08/03/2022, 9:48 AM

## 2022-08-04 NOTE — Progress Notes (Signed)
[facility] PREP Weekly Session  Patient Details  Name: [name] MRN: [id number] Date of Birth: 02-Apr-1946 Age: 76 y.o. PCP: [name], MD  Vitals:   08/04/22 1430  Weight: 192 lb (87.1 kg)     [facility] Weekly seesion - 08/04/22 1600       [facility] "PREP" Location   [facility]      Weekly Session   Topic Discussed Healthy eating tips    Minutes exercised this week 210 minutes    Classes attended to date [place] 08/04/2022, 4:42 PM

## 2022-08-12 DIAGNOSIS — R051 Acute cough: Secondary | ICD-10-CM | POA: Diagnosis not present

## 2022-08-12 DIAGNOSIS — Z03818 Encounter for observation for suspected exposure to other biological agents ruled out: Secondary | ICD-10-CM | POA: Diagnosis not present

## 2022-08-18 NOTE — Progress Notes (Signed)
[facility] PREP Weekly Session  Patient Details  Name: [name] MRN: [id number] Date of Birth: 1945-09-08 Age: 76 y.o. PCP: [name], MD  Vitals:   08/18/22 1615  Weight: 194 lb (88 kg)     [facility] Weekly seesion - 08/18/22 1600       [facility] "PREP" Location   [facility]PREP" Location [facility]      Weekly Session   Topic Discussed Restaurant Eating   salt demo   Minutes exercised this week 360 minutes    Classes attended to date [place] 08/18/2022, 4:16 PM

## 2022-08-28 DIAGNOSIS — Z6827 Body mass index (BMI) 27.0-27.9, adult: Secondary | ICD-10-CM | POA: Diagnosis not present

## 2022-08-28 DIAGNOSIS — J209 Acute bronchitis, unspecified: Secondary | ICD-10-CM | POA: Diagnosis not present

## 2022-09-01 ENCOUNTER — Ambulatory Visit: Payer: No Typology Code available for payment source | Admitting: Internal Medicine

## 2022-09-07 DIAGNOSIS — I509 Heart failure, unspecified: Secondary | ICD-10-CM | POA: Diagnosis not present

## 2022-09-07 DIAGNOSIS — D631 Anemia in chronic kidney disease: Secondary | ICD-10-CM | POA: Diagnosis not present

## 2022-09-07 DIAGNOSIS — N200 Calculus of kidney: Secondary | ICD-10-CM | POA: Diagnosis not present

## 2022-09-07 DIAGNOSIS — M109 Gout, unspecified: Secondary | ICD-10-CM | POA: Diagnosis not present

## 2022-09-07 DIAGNOSIS — N185 Chronic kidney disease, stage 5: Secondary | ICD-10-CM | POA: Diagnosis not present

## 2022-09-07 DIAGNOSIS — E1122 Type 2 diabetes mellitus with diabetic chronic kidney disease: Secondary | ICD-10-CM | POA: Diagnosis not present

## 2022-09-07 DIAGNOSIS — I120 Hypertensive chronic kidney disease with stage 5 chronic kidney disease or end stage renal disease: Secondary | ICD-10-CM | POA: Diagnosis not present

## 2022-09-07 DIAGNOSIS — N2581 Secondary hyperparathyroidism of renal origin: Secondary | ICD-10-CM | POA: Diagnosis not present

## 2022-09-07 DIAGNOSIS — I4891 Unspecified atrial fibrillation: Secondary | ICD-10-CM | POA: Diagnosis not present

## 2022-09-08 NOTE — Progress Notes (Signed)
[facility] PREP Weekly Session  Patient Details  Name: LAURENCE CROFFORD MRN: [id number] Date of Birth: 1945/12/09 Age: 77 y.o. PCP: [name], MD  Vitals:   09/08/22 1430  Weight: 186 lb (84.4 kg)     [facility] Weekly seesion - 09/08/22 1700       [facility] "PREP" Location   [facility]      Weekly Session   Topic Discussed Stress management and problem solving    Minutes exercised this week 300 minutes    Classes attended to date [place] 09/08/2022, 5:06 PM

## 2022-09-10 ENCOUNTER — Ambulatory Visit (INDEPENDENT_AMBULATORY_CARE_PROVIDER_SITE_OTHER): Payer: No Typology Code available for payment source | Admitting: Internal Medicine

## 2022-09-10 ENCOUNTER — Encounter: Payer: Self-pay | Admitting: Internal Medicine

## 2022-09-10 VITALS — BP 120/68 | HR 67 | Ht 69.0 in | Wt 198.6 lb

## 2022-09-10 DIAGNOSIS — E1159 Type 2 diabetes mellitus with other circulatory complications: Secondary | ICD-10-CM | POA: Diagnosis not present

## 2022-09-10 DIAGNOSIS — E669 Obesity, unspecified: Secondary | ICD-10-CM

## 2022-09-10 DIAGNOSIS — E782 Mixed hyperlipidemia: Secondary | ICD-10-CM | POA: Diagnosis not present

## 2022-09-10 LAB — POCT GLYCOSYLATED HEMOGLOBIN (HGB A1C): Hemoglobin A1C: 6.8 % — AB (ref 4.0–5.6)

## 2022-09-10 MED ORDER — DEXCOM G7 SENSOR MISC: 3.0000 | 4 refills | Status: DC

## 2022-09-10 MED ORDER — DEXCOM G7 RECEIVER DEVI: 1.0000 | Freq: Once | 0 refills | Status: AC

## 2022-09-10 NOTE — Progress Notes (Signed)
Patient ID: [name], male   DOB: [birth year], 77 y.o.   MRN: [id number]   HPI: [name] is a 78 y.o.-year-old male, initially referred by his nephrologist, Dr. 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 fib, CKD stage V, cerebrovascular disease, Aortic atherosclerosis, s/p TIA, ED).  Last visit 6 months ago.  Interim history: He has a stroke 12/2020 - weak R side of body.  He is now taking PREP classes at [facility], previously in cardiac rehab. No blurry vision, nausea, chest pain but has chronic shortness of breath. He was admitted 03/2022 with ureteric stone and hydronephrosis along with AKI.  He subsequently had to have cystoscopy/ureteroscopy. He also had an URI x 4 weeks >> had to have ABx, steroids.  Reviewed HbA1c levels: Lab Results  Component Value Date   HGBA1C 6.8 (H) 05/19/2022   HGBA1C 5.9 (A) 02/25/2022   HGBA1C 6.3 (A) 10/30/2021   HGBA1C 6.4 (A) 06/19/2021   HGBA1C 6.2 (H) 02/02/2021   HGBA1C 6.3 (H) 01/07/2021   HGBA1C 6.0 (H) 10/05/2020   HGBA1C 5.9 (A) 01/29/2020   HGBA1C 7.1 (A) 09/25/2019   HGBA1C 7.1 (A) 05/23/2019  09/24/2021: HbA1c 5.8% 01/11/2019: HbA1c 8.3% 10/04/2018: HbA1c 8.2%  He is currently on: - Ozempic 0.5 mg weekly >> stopped 04/2020 >> restarted 05/2020 - through the [place]. - (Glipizide 2.5 to 5 mg as needed before a  larger dinner) - not using He came off metformin due to worsening kidney function. We stopped glipizide 09/2020 due to good control.  Pt checks his sugars once a day: - am:80-135, 140 >> 89, 107-144, 155 >> 90-110, 120 - 2h after b'fast: n/c >> 207 >> 90, 110 >> n/c >> 140 - before lunch: n/c >> 102-130 >> n/c - 2h after lunch: n/c >> 120-130 >> n/c >> 134, 165 >> n/c - before dinner: 129 >> n/c >> 86 >> n/c - 2h after dinner: n/c >> 190 >> n/c  - bedtime: n/c >> 77-122 >> n/c - nighttime: n/c >> 180  (sick - steroids) Lowest sugar was 69 >> ... 89 >> 90; he has hypoglycemia awareness in the 70s. Highest sugar was 299 >> .Marland Kitchen. 155 >> 180.  Pt's meals are: - Breakfast: egg and bacon (not lately).   - Brunch: meat + veggies - Dinner: same as lunch - Snacks: crackers, popcorn   -He has stage V CKD-sees nephrology-did not have to start dialysis yet but has a fistula placed; last BUN/creatinine:  Lab Results  Component Value Date   BUN 49 (H) 05/19/2022   BUN 40 (H) 04/21/2022   CREATININE 4.06 (H) 05/19/2022   CREATININE 4.10 (H) 04/21/2022  09/24/2021: 45/3.21, GFR 19, glucose 120 01/11/2019: 45//3.79, GFR 16, glucose 149, ACR 52.5 10/04/2018: 32/3.78, GFR 15, glucose 184 He is not on ACE inhibitor/ARB.  -+ HL; last set of lipids: Lab Results  Component Value Date   CHOL 94 04/21/2022   HDL 50 04/21/2022   LDLCALC 30 04/21/2022   TRIG 69 04/21/2022   CHOLHDL 1.9 04/21/2022  09/24/2021: 142/100/59/65 He developed generalized aches and pains from pravastatin.  Now restarted Zetia 12/2021 by cardiology.  He is currently also on Praluent.  - last eye exam was in 04-12/2021: Reportedly no DR ([facility]).   - no numbness and tingling in his feet. Last foot exam 09/2021.  Pt has FH of DM in sister and brother.  He  also has hypertension. He has a stroke 12/2020 - weak R side of body, but improving. He was in PT. he started to see neurology. He was admitted 02/01/2021 for chest pain. He had AKI. He has a history of lap band surgery 2010. Lost from 373 lbs to 215-220 lbs. 01/23/2021: B12 vitamin 733, TSH 1.83  ROS: C+ see HPI  I reviewed pt's medications, allergies, PMH, social hx, family hx, and changes were documented in the history of present illness. Otherwise, unchanged from my initial visit note.  Past Medical History:  Diagnosis Date   Allergic rhinitis    Anxiety    Atrial fibrillation (HCC)    Cancer (HCC)    hx of skin cancers    Chronic kidney disease    Coronary artery  disease    a. s/p MI & CABG; b. s/p PCI Diag;  c. Cath 12/2011: LAD diff dzs/small, Diag 75% isr, 90 dist to stent (small), LCX & RCA occluded, VG->OM 40, VG->PDA patent - med rx.   COVID-19    x 3   Depression    PTSD   Diabetes mellitus    not on medications   Diastolic CHF, chronic ([place])    a. EF 55-60% by echo 2007   GERD (gastroesophageal reflux disease)    "not anymore" " I had a lap band"   History of diverticulitis of colon    5 YRS AGO   History of kidney stones    History of pneumonia    2017   History of transient ischemic attack (TIA)    CAROTID DOPPLERS NOV 2011  0-39& BIL. STENOSIS   Hyperlipidemia    Hypertension    Kidney failure    Mitral regurgitation    Myocardial infarction [facility])    Neuromuscular disorder (HCC)    left arm numbness    OA (osteoarthritis)    RIGHT KNEE ARTHOFIBROSIS W/ PAIN  (S/P  REPLACEMENT 2004)   Pneumonia    PTSD (post-traumatic stress disorder)    from [place] since 1973   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/25/2016   Complex valvuloplasty including artificial Gore-tex neochord placement x4, plication of anterior commissure, and 26 mm Sorin Memo 3D ring annuloplasty via right mini thoracotomy approach   Shortness of breath dyspnea    with exertion   Skin cancer    Sleep apnea    cpap- see ov note in EPIC 05/12/11 for settings    Stroke [facility])    hx of   Stroke [facility])    Past Surgical History:  Procedure Laterality Date   AV FISTULA PLACEMENT Left 08/02/2018   Procedure: ARTERIOVENOUS (AV) FISTULA CREATION RADIOCEPHALIC;  Surgeon: Angelia Mould, MD;  Location: [place];  Service: Vascular;  Laterality: Left;   BLEPHAROPLASTY  [place]  2001, 2004, 2009, 2011   CARDIAC CATHETERIZATION N/A 01/23/2016   Procedure: Right/Left Heart Cath and Coronary Angiography;  Surgeon: [name], MD;  Location: [place] CV LAB;  Service: Cardiovascular;  Laterality: N/A;   CARDIOVERSION N/A  09/07/2017   Procedure: CARDIOVERSION;  Surgeon: [name], MD;  Location: [facility] ENDOSCOPY;  Service: Cardiovascular;  Laterality: N/A;   CARDIOVERSION N/A 09/13/2019   Procedure: CARDIOVERSION;  Surgeon: [name], MD;  Location: [facility] ENDOSCOPY;  Service: Cardiovascular;  Laterality: N/A;   CARDIOVERSION N/A 10/19/2019   Procedure: CARDIOVERSION;  Surgeon: [name], MD;  Location: [facility] ENDOSCOPY;  Service: Cardiovascular;  Laterality: N/A;  CARDIOVERSION N/A 04/14/2022   Procedure: CARDIOVERSION;  Surgeon: [name], MD;  Location: [facility] ENDOSCOPY;  Service: Cardiovascular;  Laterality: N/A;   CATARACT EXTRACTION W/ INTRAOCULAR LENS IMPLANT Bilateral    CHONDROPLASTY  07/22/2011   Procedure: CHONDROPLASTY;  Surgeon: Dione Plover Aluisio;  Location: [place];  Service: Orthopedics;;   CIRCUMCISION  [place]-- POST CABG   W/ STENT, last cath 01/07/2012    CORONARY ARTERY BYPASS GRAFT  1995   X3 VESSEL   CORONARY ARTERY BYPASS GRAFT  1995   CORONARY STENT PLACEMENT     CYSTOSCOPY W/ URETERAL STENT PLACEMENT Right 04/11/2022   Procedure: CYSTOSCOPY WITH RETROGRADE PYELOGRAM/URETERAL STENT PLACEMENT;  Surgeon: Cleon Gustin, MD;  Location: [facility];  Service: Urology;  Laterality: Right;   CYSTOSCOPY/URETEROSCOPY/HOLMIUM LASER/STENT PLACEMENT Right 01/08/2021   Procedure: CYSTOSCOPY/RETROGRADE/URETEROSCOPY/HOLMIUM LASER/STENT PLACEMENT;  Surgeon: Franchot Gallo, MD;  Location: [facility];  Service: Urology;  Laterality: Right;   CYSTOSCOPY/URETEROSCOPY/HOLMIUM LASER/STENT PLACEMENT Right 05/27/2022   Procedure: CYSTOSCOPY, URETEROSCOPY, HOLMIUM LASER, STENT EXCHANGE;  Surgeon: Ceasar Mons, MD;  Location: [facility];  Service: Urology;  Laterality: Right;   INCISION AND DRAINAGE PERIRECTAL ABSCESS N/A 09/01/2021   Procedure: IRRIGATION AND DEBRIDEMENT PERIRECTAL ABSCESS;  Surgeon: Kieth Brightly Arta Bruce, MD;   Location: [facility];  Service: General;  Laterality: N/A;   IR PERC TUN PERIT CATH WO PORT S&I D[place]a [place]  03/06/2021   IR REMOVAL TUN CV CATH W/O FL  04/14/2021   KNEE ARTHROSCOPY  07/22/2011   Procedure: ARTHROSCOPY KNEE;  Surgeon: [name];  Location: [place];  Service: Orthopedics;  Laterality: Right;  WITH DEBRIDEMENT    KNEE ARTHROSCOPY W/ MENISCECTOMY  X2 IN 2002-- RIGHT KNEE   LAPAROSCOPIC GASTRIC BANDING  01-15-09   LEFT HEART CATH AND CORONARY ANGIOGRAPHY N/A 04/29/2017   Procedure: LEFT HEART CATH AND CORONARY ANGIOGRAPHY;  Surgeon: [name], MD;  Location: [place] CV LAB;  Service: Cardiovascular;  Laterality: N/A;   LEFT HEART CATHETERIZATION WITH CORONARY ANGIOGRAM N/A 09/11/2013   Procedure: LEFT HEART CATHETERIZATION WITH CORONARY ANGIOGRAM;  Surgeon: [name], MD;  Location: [facility] CATH LAB;  Service: Cardiovascular;  Laterality: N/A;   MITRAL VALVE REPAIR Right 03/25/2016   Procedure: MINIMALLY INVASIVE REOPERATION FOR MITRAL VALVE REPAIR  (MVR) with size 26 Sorin Memo 3D;  Surgeon: [name], MD;  Location: [place];  Service: Open Heart Surgery;  Laterality: Right;   MITRAL VALVE REPAIR  03/2016   RIGHT KNEE ED COMPARTMENT REPLACEMENT  2004   SYNOVECTOMY  07/22/2011   Procedure: SYNOVECTOMY;  Surgeon: Dione Plover Aluisio;  Location: [place];  Service: Orthopedics;;   TEE WITHOUT CARDIOVERSION N/A 01/23/2016   Procedure: TRANSESOPHAGEAL ECHOCARDIOGRAM (TEE);  Surgeon: [name], MD;  Location: [place];  Service: Cardiovascular;  Laterality: N/A;   TEE WITHOUT CARDIOVERSION N/A 03/25/2016   Procedure: TRANSESOPHAGEAL ECHOCARDIOGRAM (TEE);  Surgeon: [name], MD;  Location: [place];  Service: Open Heart Surgery;  Laterality: N/A;   TEE WITHOUT CARDIOVERSION N/A 03/04/2021   Procedure: TRANSESOPHAGEAL ECHOCARDIOGRAM (TEE);  Surgeon: Lelon Perla, MD;  Location: [facility] ENDOSCOPY;  Service: Cardiovascular;  Laterality: N/A;    TEE WITHOUT CARDIOVERSION N/A 04/14/2022   Procedure: TRANSESOPHAGEAL ECHOCARDIOGRAM (TEE);  Surgeon: [name], MD;  Location: [facility] ENDOSCOPY;  Service: Cardiovascular;  Laterality: N/A;   UMBILICAL HERNIA REPAIR  01-15-09   W/ GASTRIC  BANDING PROCEDURE   Social History   Socioeconomic History   Marital status: Married    Spouse name: Not on file   Number of children: 1   Years of education: Not on file   Highest education level: Not on file  Occupational History   Chief Strategy Officer, semi-retired  Social Needs   Financial resource strain: Not on file   Food insecurity:    Worry: Not on file    Inability: Not on file   Transportation needs:    Medical: Not on file    Non-medical: Not on file  Tobacco Use   Smoking status: Never Smoker   Smokeless tobacco: Never Used  Substance and Sexual Activity   Alcohol use: Yes, liquor    Comment: OCCASIONAL   Drug use: No   Sexual activity: Not on file  Lifestyle   Physical activity:    Days per week: Not on file    Minutes per session: Not on file   Stress: Not on file  Relationships   Social connections:    Talks on phone: Not on file    Gets together: Not on file    Attends religious service: Not on file    Active member of club or organization: Not on file    Attends meetings of clubs or organizations: Not on file    Relationship status: Not on file   Intimate partner violence:    Fear of current or ex partner: Not on file    Emotionally abused: Not on file    Physically abused: Not on file    Forced sexual activity: Not on file  Other Topics Concern   Not on file  Social History Narrative   Not on file   Current Outpatient Medications on File Prior to Visit  Medication Sig Dispense Refill   Alirocumab (PRALUENT) 75 MG/ML SOAJ Inject 75 mg into the skin every 14 (fourteen) days.     allopurinol (ZYLOPRIM) 100 MG tablet Take 50 mg by mouth 2 (two) times a week.     amiodarone (PACERONE) 200 MG tablet Take 1 tablet  (200 mg total) by mouth in the morning.     apixaban (ELIQUIS) 5 MG TABS tablet Take 1 tablet (5 mg total) by mouth 2 (two) times daily. 180 tablet 3   busPIRone (BUSPAR) 10 MG tablet Take 5 mg by mouth 2 (two) times daily.     cetirizine (ZYRTEC) 10 MG tablet Take 10 mg by mouth in the morning.     colchicine 0.6 MG tablet Take 0.6-1.2 mg by mouth 2 (two) times daily as needed (gout).      Continuous Blood Gluc Sensor (DEXCOM G7 SENSOR) MISC 3 each by Does not apply route every 30 (thirty) days. Apply 1 sensor every 10 days 9 each 4   Cyanocobalamin (VITAMIN B-12) 5000 MCG TBDP Take 5,000 mcg by mouth 2 (two) times a week.     cycloSPORINE (RESTASIS) 0.05 % ophthalmic emulsion Place 1 drop into both eyes 2 (two) times daily as needed (eye irritation.).     diclofenac Sodium (VOLTAREN) 1 % GEL Apply 2 g topically 3 (three) times daily as needed for pain.     ezetimibe (ZETIA) 10 MG tablet Take 1 tablet (10 mg total) by mouth daily. 90 tablet 3   fluticasone (FLONASE) 50 MCG/ACT nasal spray Place 1 spray into both nostrils daily as needed for allergies or rhinitis.     furosemide (LASIX) 40 MG tablet Take 40 mg by mouth See  admin instructions. Take 1 tablet (40 mg) by mouth every other day alternating with 80 mg dosage     HYDROcodone-acetaminophen (NORCO/VICODIN) 5-325 MG tablet Take 1 tablet by mouth every 6 (six) hours as needed for moderate pain or severe pain. 12 tablet 0   methocarbamol (ROBAXIN) 500 MG tablet Take 500 mg by mouth 3 (three) times daily as needed for muscle spasms.     Multiple Vitamin (MULTIVITAMIN) capsule Take 1 capsule by mouth in the morning.     mupirocin ointment (BACTROBAN) 2 % Apply 1 Application topically daily as needed (irritation).     nitroGLYCERIN (NITROSTAT) 0.4 MG SL tablet 1 TAB UNDER THE TONGUE EVERY 5 MINUTES AS NEEDED FOR CHEST PAIN UP TO 3 DOSES, IF PERSIST CALL 911 25 tablet 3   ONETOUCH VERIO test strip 1 each daily.     oxybutynin (DITROPAN) 5 MG tablet  Take 1 tablet (5 mg total) by mouth every 8 (eight) hours as needed for bladder spasms. 30 tablet 1   pantoprazole (PROTONIX) 40 MG tablet Take 40 mg by mouth daily as needed (heartburn).     phenazopyridine (PYRIDIUM) 200 MG tablet Take 1 tablet (200 mg total) by mouth 3 (three) times daily as needed (for pain with urination). 30 tablet 0   potassium chloride SA (KLOR-CON M) 20 MEQ tablet Take 20 mEq by mouth daily.     Semaglutide,0.25 or 0.'5MG'$ /DOS, (OZEMPIC, 0.25 OR 0.5 MG/DOSE,) 2 MG/1.5ML SOPN Inject 0.5 mg into the skin once a week. 4.5 mL 3   traZODone (DESYREL) 100 MG tablet Take 100 mg by mouth at bedtime.     No current facility-administered medications on file prior to visit.   Allergies  Allergen Reactions   Crestor [Rosuvastatin] Other (See Comments)    Muscle aches    Lipitor [Atorvastatin] Other (See Comments)    Muscle aches   Pravastatin Other (See Comments)    Muscle aches   Carvedilol Other (See Comments)    loss of appetite   Metformin Diarrhea   Serotonin Reuptake Inhibitors (Ssris) Other (See Comments)    Muscle aches   Zocor [Simvastatin] Other (See Comments)     Muscle pain   Codeine Itching and Other (See Comments)    Extremity tingling-- can take synthetic   Family History  Problem Relation Age of Onset   Other Mother        joint problems   Alzheimer's disease Mother    Hypertension Father    Heart disease Father    CVA Father        age 9   Depression Father    Heart disease Sister        CABG   Stroke Sister    Heart failure Sister        congestive   Diabetes Sister    Heart disease Brother    Hypertension Brother    Congestive Heart Failure Brother     PE: BP 120/68 (BP Location: Right Arm, Patient Position: Sitting, Cuff Size: Normal)   Pulse 67   Ht '5\' 9"'$  (1.753 m)   Wt 198 lb 9.6 oz (90.1 kg)   SpO2 95%   BMI 29.33 kg/m  Wt Readings from Last 3 Encounters:  09/10/22 198 lb 9.6 oz (90.1 kg)  09/08/22 186 lb (84.4 kg)   08/18/22 194 lb (88 kg)   Constitutional: overweight, in NAD Eyes: PERRLA, EOMI, no exophthalmos ENT: no thyromegaly, no cervical lymphadenopathy Cardiovascular: RRR, No MRG Respiratory: CTA B except  wheezes in anterior upper lungs Musculoskeletal: no deformities, strength intact in all 4 Skin: multiple ecchymosis on bilateral dorsum of hands Neurological: + tremor with outstretched R hand  ASSESSMENT: 1. DM2, non-insulin-dependent, uncontrolled, with long-term complications - CAD, s/p coronaroplasty, then CABG 1995 - cardiologist - Dr. Aundra Dubin - CHF, EF 55 to 65% - Atrial fibrillation, cardioversion in 2019 - Carotid artery disease - Cerebrovascular disease, s/p TIA, s/p stroke 12/2020 - Ao ATS per abdominal CT (08/31/2021) - CKD stage V, pending dialysis -Dr. Jimmy Footman - ED  2. HL  3.  Obesity class I  PLAN:  1. Patient with longstanding, previously uncontrolled type 2 diabetes, with improved control in the last 2.5 years.  He had to come off metformin due to declining kidney function.  We also stop sulfonylurea due to good control but discussed about adding half a tablet occasionally before a larger dinner.  At last visit I recommended a Dexcom CGM.  He was planning to obtain this through the [place].  Otherwise, we did not change his regimen as sugars were mostly at goal with only occasional slightly higher blood sugar in the morning.  He was not checking later in the day.  At last visit, HbA1c was excellent, at 5.9%, but he had another HbA1c obtained 04/2022, which was higher, at 6.8%. -At today's visit, he is still only checking blood sugars in the morning and they are at goal.  He did not start taking glipizide for larger meals, as discussed.  For now, we can continue without this. -He did not obtain the CGM from the [place] after our last visit.  I called this and again today.  I advised him to check with the pharmacy if he can pick it up. - I suggested to:  Patient Instructions   Please continue: - Ozempic 0.5 mg weekly   You can take Glipizide 2.5-5 mg before a larger dinner.  Please return in 6 months with your sugar log or CGM.   - we checked his HbA1c: 6.8% (stable) - advised to check sugars at different times of the day - 4x a day, rotating check times - advised for yearly eye exams >> he is UTD - return to clinic in 6 months  2. HL -Reviewed latest lipid panel from 03/2022: All fractions at goal: Lab Results  Component Value Date   CHOL 94 04/21/2022   HDL 50 04/21/2022   LDLCALC 30 04/21/2022   TRIG 69 04/21/2022   CHOLHDL 1.9 04/21/2022  -He is on Zetia (restarted after the above results returned) and also Praluent.  He had side effects from statins in the past  3.  Obesity class I -Will continue Ozempic which should also help with weight loss -He lost 1 pound before last visit, previously gained 12 -Weight is up by 1 pound net since last visit  Philemon Kingdom, MD PhD [facility] Endocrinology

## 2022-09-10 NOTE — Patient Instructions (Signed)
Please continue: - Ozempic 0.5 mg weekly   You can take Glipizide 2.5-5 mg before a larger dinner.  Please return in 6 months with your sugar log or CGM.

## 2022-09-15 ENCOUNTER — Telehealth: Payer: Self-pay

## 2022-09-16 NOTE — Telephone Encounter (Signed)
Received text from pt requesting call back Call to pt. Pt wants to stop PREP due to family obligations. May attempt in future.  Asked pt to contact me if he wants to try another class. Will require another referral from Provider.

## 2022-09-30 DIAGNOSIS — E7800 Pure hypercholesterolemia, unspecified: Secondary | ICD-10-CM | POA: Diagnosis not present

## 2022-09-30 DIAGNOSIS — E559 Vitamin D deficiency, unspecified: Secondary | ICD-10-CM | POA: Diagnosis not present

## 2022-09-30 DIAGNOSIS — Z125 Encounter for screening for malignant neoplasm of prostate: Secondary | ICD-10-CM | POA: Diagnosis not present

## 2022-09-30 DIAGNOSIS — N189 Chronic kidney disease, unspecified: Secondary | ICD-10-CM | POA: Diagnosis not present

## 2022-09-30 DIAGNOSIS — I10 Essential (primary) hypertension: Secondary | ICD-10-CM | POA: Diagnosis not present

## 2022-09-30 DIAGNOSIS — E1122 Type 2 diabetes mellitus with diabetic chronic kidney disease: Secondary | ICD-10-CM | POA: Diagnosis not present

## 2022-10-01 ENCOUNTER — Encounter (HOSPITAL_COMMUNITY): Payer: Self-pay | Admitting: *Deleted

## 2022-10-12 DIAGNOSIS — R972 Elevated prostate specific antigen [PSA]: Secondary | ICD-10-CM | POA: Diagnosis not present

## 2022-10-12 DIAGNOSIS — E1122 Type 2 diabetes mellitus with diabetic chronic kidney disease: Secondary | ICD-10-CM | POA: Diagnosis not present

## 2022-10-12 DIAGNOSIS — R062 Wheezing: Secondary | ICD-10-CM | POA: Diagnosis not present

## 2022-10-12 DIAGNOSIS — Z0000 Encounter for general adult medical examination without abnormal findings: Secondary | ICD-10-CM | POA: Diagnosis not present

## 2022-10-12 DIAGNOSIS — E559 Vitamin D deficiency, unspecified: Secondary | ICD-10-CM | POA: Diagnosis not present

## 2022-10-12 DIAGNOSIS — Z6830 Body mass index (BMI) 30.0-30.9, adult: Secondary | ICD-10-CM | POA: Diagnosis not present

## 2022-10-12 DIAGNOSIS — I499 Cardiac arrhythmia, unspecified: Secondary | ICD-10-CM | POA: Diagnosis not present

## 2022-10-12 DIAGNOSIS — N184 Chronic kidney disease, stage 4 (severe): Secondary | ICD-10-CM | POA: Diagnosis not present

## 2022-10-12 DIAGNOSIS — E7800 Pure hypercholesterolemia, unspecified: Secondary | ICD-10-CM | POA: Diagnosis not present

## 2022-11-05 DIAGNOSIS — C4441 Basal cell carcinoma of skin of scalp and neck: Secondary | ICD-10-CM | POA: Diagnosis not present

## 2022-11-05 DIAGNOSIS — D692 Other nonthrombocytopenic purpura: Secondary | ICD-10-CM | POA: Diagnosis not present

## 2022-11-05 DIAGNOSIS — D171 Benign lipomatous neoplasm of skin and subcutaneous tissue of trunk: Secondary | ICD-10-CM | POA: Diagnosis not present

## 2022-11-05 DIAGNOSIS — D485 Neoplasm of uncertain behavior of skin: Secondary | ICD-10-CM | POA: Diagnosis not present

## 2022-11-05 DIAGNOSIS — Z85828 Personal history of other malignant neoplasm of skin: Secondary | ICD-10-CM | POA: Diagnosis not present

## 2022-11-05 DIAGNOSIS — D045 Carcinoma in situ of skin of trunk: Secondary | ICD-10-CM | POA: Diagnosis not present

## 2022-11-05 DIAGNOSIS — L570 Actinic keratosis: Secondary | ICD-10-CM | POA: Diagnosis not present

## 2022-11-05 DIAGNOSIS — L821 Other seborrheic keratosis: Secondary | ICD-10-CM | POA: Diagnosis not present

## 2022-12-02 ENCOUNTER — Telehealth (HOSPITAL_COMMUNITY): Payer: Self-pay

## 2022-12-02 DIAGNOSIS — R972 Elevated prostate specific antigen [PSA]: Secondary | ICD-10-CM | POA: Diagnosis not present

## 2022-12-02 NOTE — Telephone Encounter (Signed)
Patient called with complaints of dizziness, headache, and no energy.  He is not checking his BP at home regularly and has no readings. He cannot put his finger on what it is but he knows he does not feel well. He has an appt with [facility] at the end of the month. He saw his PCP recently and was told he should go on dialysis. He called his nephrologist but has not heard anything back so he called [place] to see if we need to see him. Please advise.

## 2022-12-04 NOTE — Telephone Encounter (Signed)
Left message to call back  

## 2022-12-15 DIAGNOSIS — M791 Myalgia, unspecified site: Secondary | ICD-10-CM | POA: Diagnosis not present

## 2022-12-15 DIAGNOSIS — M546 Pain in thoracic spine: Secondary | ICD-10-CM | POA: Diagnosis not present

## 2022-12-22 ENCOUNTER — Ambulatory Visit (HOSPITAL_COMMUNITY)
Admission: RE | Admit: 2022-12-22 | Discharge: 2022-12-22 | Disposition: A | Discharge status: Home / Self Care | Payer: Medicare Other | Source: Ambulatory Visit | Attending: Physician Assistant | Admitting: Physician Assistant

## 2022-12-22 VITALS — BP 136/66 | HR 61 | Ht 69.0 in | Wt 200.0 lb

## 2022-12-22 DIAGNOSIS — I2510 Atherosclerotic heart disease of native coronary artery without angina pectoris: Secondary | ICD-10-CM | POA: Diagnosis not present

## 2022-12-22 DIAGNOSIS — I110 Hypertensive heart disease with heart failure: Secondary | ICD-10-CM | POA: Insufficient documentation

## 2022-12-22 DIAGNOSIS — I4719 Other supraventricular tachycardia: Secondary | ICD-10-CM | POA: Diagnosis not present

## 2022-12-22 DIAGNOSIS — Z5181 Encounter for therapeutic drug level monitoring: Secondary | ICD-10-CM

## 2022-12-22 DIAGNOSIS — I4819 Other persistent atrial fibrillation: Secondary | ICD-10-CM | POA: Diagnosis not present

## 2022-12-22 DIAGNOSIS — I5032 Chronic diastolic (congestive) heart failure: Secondary | ICD-10-CM | POA: Insufficient documentation

## 2022-12-22 DIAGNOSIS — Z79899 Other long term (current) drug therapy: Secondary | ICD-10-CM

## 2022-12-22 DIAGNOSIS — Z951 Presence of aortocoronary bypass graft: Secondary | ICD-10-CM | POA: Diagnosis not present

## 2022-12-22 DIAGNOSIS — M25511 Pain in right shoulder: Secondary | ICD-10-CM | POA: Diagnosis not present

## 2022-12-22 DIAGNOSIS — D6869 Other thrombophilia: Secondary | ICD-10-CM | POA: Insufficient documentation

## 2022-12-22 DIAGNOSIS — Z7901 Long term (current) use of anticoagulants: Secondary | ICD-10-CM | POA: Insufficient documentation

## 2022-12-22 DIAGNOSIS — G4733 Obstructive sleep apnea (adult) (pediatric): Secondary | ICD-10-CM | POA: Insufficient documentation

## 2022-12-22 DIAGNOSIS — I4892 Unspecified atrial flutter: Secondary | ICD-10-CM | POA: Insufficient documentation

## 2022-12-22 LAB — TSH: TSH: 1.721 u[IU]/mL (ref 0.350–4.500)

## 2022-12-22 LAB — COMPREHENSIVE METABOLIC PANEL
ALT: 14 U/L (ref 0–44)
AST: 18 U/L (ref 15–41)
Albumin: 3.8 g/dL (ref 3.5–5.0)
Alkaline Phosphatase: 107 U/L (ref 38–126)
Anion gap: 12 (ref 5–15)
BUN: 42 mg/dL — ABNORMAL HIGH (ref 8–23)
CO2: 26 mmol/L (ref 22–32)
Calcium: 9.7 mg/dL (ref 8.9–10.3)
Chloride: 99 mmol/L (ref 98–111)
Creatinine, Ser: 4.22 mg/dL — ABNORMAL HIGH (ref 0.61–1.24)
GFR, Estimated: 14 mL/min — ABNORMAL LOW (ref >60)
Glucose, Bld: 228 mg/dL — ABNORMAL HIGH (ref 70–99)
Potassium: 3.3 mmol/L — ABNORMAL LOW (ref 3.5–5.1)
Sodium: 137 mmol/L (ref 135–145)
Total Bilirubin: 0.8 mg/dL (ref 0.3–1.2)
Total Protein: 6.2 g/dL — ABNORMAL LOW (ref 6.5–8.1)

## 2022-12-22 NOTE — Progress Notes (Signed)
Primary Care Physician: Daisy Floro, MD Referring Physician: Lenn Sink Cardiologist: Dr. 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 In 2017, he developed increased exertional dyspnea.  Eventually had a TEE showing severe MR with prolapse of a portion of the anterior mitral valve leaflet.  Coronary angiography in 6/17 showed patent grafts and 90% stenosis of distal diagonal not amenable to intervention.  In 8/17, he had mitral valve repair.  Post-op diastolic CHF, Lasix started and increased.   He developed atrial arrhythmias in 12/18.  Both a. fibrillation and flutter were seen. Saw Dr. Johney Frame, recommended DCCV,   amiodarone or sotalol if fibrillation recurred shortly after cardioversion.  He had DCCV in 1/19 and  has been in SR for the last 2 years.Marland Kitchen   He is now in afib clinic as he was seen in the [place] yesterday and was noted to be in rate controlled afib. Was referred here. He continues in rate controlled afib today. He was unaware that he was out of rhythm, but now looking back, he feels that he has had fatigue for around 2 weeks.He does not know of an trigger other than  has had some issues with gout. No  missed anticoagulation for at least 3 weeks.  F/u in afib clinic, 08/2819. He had a successful cardioversion and felt well for the day, then started feeling worse the next day. He is back in rate controlled afib.  F/u in afib clinic, 10/12/19. He has been loading on amiodarone 200 mg bid. He remians in afib with CVR so  will plan for cardioversion. He states no missed doses of eliquis  5 mg bid for the last 3 weeks. He feels very fatigued in afib.   Follow up in the AF clinic 04/08/22. Patient reports that 04/03/22 he noted increased fatigue with exertion. He and his wife also noticed an irregular heart beat. ECG today shows rate controlled afib. There were no specific triggers that he could identify.   Follow  up in the AF clinic 06/22/22. Patient is s/p TEE guided DCCV 04/14/22 during a hospitalization for an obstructing kidney stone. He reports that he has done well since that time with no further afib. No bleeding issues on anticoagulation.   Follow up in the AF clinic 12/22/22. Patient reports that he feels fatigued most of the time. He is in SR today. He did have some atypical chest discomfort but this resolved after having trigger point injections with his orthopedist. No bleeding issues on anticoagulation.   Today, he denies symptoms of palpitations, chest pain, orthopnea, PND, lower extremity edema, dizziness, presyncope, syncope, or neurologic sequela. The patient is tolerating medications without difficulties and is otherwise without complaint today.   Past Medical History:  Diagnosis Date   Allergic rhinitis    Anxiety    Atrial fibrillation (HCC)    Cancer (HCC)    hx of skin cancers    Chronic kidney disease    Coronary artery disease    a. s/p MI & CABG; b. s/p PCI Diag;  c. Cath 12/2011: LAD diff dzs/small, Diag 75% isr, 90 dist to stent (small), LCX & RCA occluded, VG->OM 40, VG->PDA patent - med rx.   COVID-19    x 3   Depression    PTSD   Diabetes mellitus    not on medications   Diastolic CHF, chronic (HCC)  a. EF 55-60% by echo 2007   GERD (gastroesophageal reflux disease)    "not anymore" " I had a lap band"   History of diverticulitis of colon    5 YRS AGO   History of kidney stones    History of pneumonia    2017   History of transient ischemic attack (TIA)    CAROTID DOPPLERS NOV 2011  0-39& BIL. STENOSIS   Hyperlipidemia    Hypertension    Kidney failure    Mitral regurgitation    Myocardial infarction [facility])    Neuromuscular disorder (HCC)    left arm numbness    OA (osteoarthritis)    RIGHT KNEE ARTHOFIBROSIS W/ PAIN  (S/P  REPLACEMENT 2004)   Pneumonia    PTSD (post-traumatic stress disorder)    from [place] since 1973   S/P minimally invasive mitral  valve repair 03/25/2016   Complex valvuloplasty including artificial Gore-tex neochord placement x4, plication of anterior commissure, and 26 mm Sorin Memo 3D ring annuloplasty via right mini thoracotomy approach   Shortness of breath dyspnea    with exertion   Skin cancer    Sleep apnea    cpap- see ov note in EPIC 05/12/11 for settings    Stroke [facility])    hx of   Stroke [facility])    Past Surgical History:  Procedure Laterality Date   AV FISTULA PLACEMENT Left 08/02/2018   Procedure: ARTERIOVENOUS (AV) FISTULA CREATION RADIOCEPHALIC;  Surgeon: Chuck Hint, MD;  Location: [facility] OR;  Service: Vascular;  Laterality: Left;   BLEPHAROPLASTY  1985   BILATERAL   CARDIAC CATHETERIZATION  2001, 2004, 2009, 2011   CARDIAC CATHETERIZATION N/A 01/23/2016   Procedure: Right/Left Heart Cath and Coronary Angiography;  Surgeon: Laurey Morale, MD;  Location: [facility] INVASIVE CV LAB;  Service: Cardiovascular;  Laterality: N/A;   CARDIOVERSION N/A 09/07/2017   Procedure: CARDIOVERSION;  Surgeon: Laurey Morale, MD;  Location: [facility] ENDOSCOPY;  Service: Cardiovascular;  Laterality: N/A;   CARDIOVERSION N/A 09/13/2019   Procedure: CARDIOVERSION;  Surgeon: Laurey Morale, MD;  Location: [facility] ENDOSCOPY;  Service: Cardiovascular;  Laterality: N/A;   CARDIOVERSION N/A 10/19/2019   Procedure: CARDIOVERSION;  Surgeon: Laurey Morale, MD;  Location: [facility] ENDOSCOPY;  Service: Cardiovascular;  Laterality: N/A;   CARDIOVERSION N/A 04/14/2022   Procedure: CARDIOVERSION;  Surgeon: Laurey Morale, MD;  Location: [facility] ENDOSCOPY;  Service: Cardiovascular;  Laterality: N/A;   CATARACT EXTRACTION W/ INTRAOCULAR LENS IMPLANT Bilateral    CHONDROPLASTY  07/22/2011   Procedure: CHONDROPLASTY;  Surgeon: Loanne Drilling;  Location: [facility];  Service: Orthopedics;;   CIRCUMCISION  35 YRS  AGO   COLONOSCOPY     CORONARY ANGIOPLASTY  1996-- POST CABG   W/ STENT, last cath 01/07/2012    CORONARY ARTERY BYPASS GRAFT   1995   X3 VESSEL   CORONARY ARTERY BYPASS GRAFT  1995   CORONARY STENT PLACEMENT     CYSTOSCOPY W/ URETERAL STENT PLACEMENT Right 04/11/2022   Procedure: CYSTOSCOPY WITH RETROGRADE PYELOGRAM/URETERAL STENT PLACEMENT;  Surgeon: Malen Gauze, MD;  Location: [facility] ORS;  Service: Urology;  Laterality: Right;   CYSTOSCOPY/URETEROSCOPY/HOLMIUM LASER/STENT PLACEMENT Right 01/08/2021   Procedure: CYSTOSCOPY/RETROGRADE/URETEROSCOPY/HOLMIUM LASER/STENT PLACEMENT;  Surgeon: Marcine Matar, MD;  Location: [facility] ORS;  Service: Urology;  Laterality: Right;   CYSTOSCOPY/URETEROSCOPY/HOLMIUM LASER/STENT PLACEMENT Right 05/27/2022   Procedure: CYSTOSCOPY, URETEROSCOPY, HOLMIUM LASER, STENT EXCHANGE;  Surgeon: Rene Paci, MD;  Location: [facility] ORS;  Service: Urology;  Laterality: Right;  INCISION AND DRAINAGE PERIRECTAL ABSCESS N/A 09/01/2021   Procedure: IRRIGATION AND DEBRIDEMENT PERIRECTAL ABSCESS;  Surgeon: Sheliah Hatch De Blanch, MD;  Location: [facility] ORS;  Service: General;  Laterality: N/A;   IR PERC TUN PERIT CATH WO PORT S&I Judi Cong  03/06/2021   IR REMOVAL TUN CV CATH W/O FL  04/14/2021   KNEE ARTHROSCOPY  07/22/2011   Procedure: ARTHROSCOPY KNEE;  Surgeon: Gus Rankin Aluisio;  Location: [facility];  Service: Orthopedics;  Laterality: Right;  WITH DEBRIDEMENT    KNEE ARTHROSCOPY W/ MENISCECTOMY  X2 IN 2002-- RIGHT KNEE   LAPAROSCOPIC GASTRIC BANDING  01-15-09   LEFT HEART CATH AND CORONARY ANGIOGRAPHY N/A 04/29/2017   Procedure: LEFT HEART CATH AND CORONARY ANGIOGRAPHY;  Surgeon: Laurey Morale, MD;  Location: [facility] INVASIVE CV LAB;  Service: Cardiovascular;  Laterality: N/A;   LEFT HEART CATHETERIZATION WITH CORONARY ANGIOGRAM N/A 09/11/2013   Procedure: LEFT HEART CATHETERIZATION WITH CORONARY ANGIOGRAM;  Surgeon: Laurey Morale, MD;  Location: [facility] CATH LAB;  Service: Cardiovascular;  Laterality: N/A;   MITRAL VALVE REPAIR Right 03/25/2016   Procedure: MINIMALLY INVASIVE REOPERATION FOR  MITRAL VALVE REPAIR  (MVR) with size 26 Sorin Memo 3D;  Surgeon: Purcell Nails, MD;  Location: [facility] OR;  Service: Open Heart Surgery;  Laterality: Right;   MITRAL VALVE REPAIR  03/2016   RIGHT KNEE ED COMPARTMENT REPLACEMENT  2004   SYNOVECTOMY  07/22/2011   Procedure: SYNOVECTOMY;  Surgeon: Gus Rankin Aluisio;  Location: [facility];  Service: Orthopedics;;   TEE WITHOUT CARDIOVERSION N/A 01/23/2016   Procedure: TRANSESOPHAGEAL ECHOCARDIOGRAM (TEE);  Surgeon: Laurey Morale, MD;  Location: [facility] ENDOSCOPY;  Service: Cardiovascular;  Laterality: N/A;   TEE WITHOUT CARDIOVERSION N/A 03/25/2016   Procedure: TRANSESOPHAGEAL ECHOCARDIOGRAM (TEE);  Surgeon: Purcell Nails, MD;  Location: [facility] OR;  Service: Open Heart Surgery;  Laterality: N/A;   TEE WITHOUT CARDIOVERSION N/A 03/04/2021   Procedure: TRANSESOPHAGEAL ECHOCARDIOGRAM (TEE);  Surgeon: Lewayne Bunting, MD;  Location: [facility] ENDOSCOPY;  Service: Cardiovascular;  Laterality: N/A;   TEE WITHOUT CARDIOVERSION N/A 04/14/2022   Procedure: TRANSESOPHAGEAL ECHOCARDIOGRAM (TEE);  Surgeon: Laurey Morale, MD;  Location: [facility] ENDOSCOPY;  Service: Cardiovascular;  Laterality: N/A;   UMBILICAL HERNIA REPAIR  01-15-09   W/ GASTRIC BANDING PROCEDURE    Current Outpatient Medications  Medication Sig Dispense Refill   albuterol (VENTOLIN HFA) 108 (90 Base) MCG/ACT inhaler SMARTSIG:1-2 Puff(s) By Mouth Every 4-6 Hours PRN     Alirocumab (PRALUENT) 75 MG/ML SOAJ Inject 75 mg into the skin every 14 (fourteen) days.     allopurinol (ZYLOPRIM) 100 MG tablet Take 50 mg by mouth 2 (two) times a week.     amiodarone (PACERONE) 200 MG tablet Take 1 tablet (200 mg total) by mouth in the morning.     ANDROGEL PUMP 20.25 MG/ACT (1.62%) GEL SMARTSIG:2 Pump Topical Daily     apixaban (ELIQUIS) 5 MG TABS tablet Take 1 tablet (5 mg total) by mouth 2 (two) times daily. 180 tablet 3   ARTIFICIAL SALIVA MT Swish and swallow 1-2 tsp Mouth/Throat once a day if needed      betamethasone valerate lotion (VALISONE) 0.1 % 1 application Externally once a day if needed     busPIRone (BUSPAR) 10 MG tablet Take 5 mg by mouth 2 (two) times daily.     Carboxymethylcellulose Sodium 1 % GEL 1 drop Ophthalmic once a day if needed     cetirizine (ZYRTEC) 10 MG tablet Take 10  mg by mouth in the morning.     chlorpheniramine-HYDROcodone (TUSSIONEX) 10-8 MG/5ML Take 5 mLs by mouth as needed.     colchicine 0.6 MG tablet Take 0.6-1.2 mg by mouth 2 (two) times daily as needed (gout).      Continuous Blood Gluc Sensor (DEXCOM G7 SENSOR) MISC 3 each by Does not apply route every 30 (thirty) days. Apply 1 sensor every 10 days 9 each 4   Cyanocobalamin (VITAMIN B-12) 5000 MCG TBDP Take 5,000 mcg by mouth 2 (two) times a week.     cycloSPORINE (RESTASIS) 0.05 % ophthalmic emulsion Place 1 drop into both eyes 2 (two) times daily as needed (eye irritation.).     diclofenac Sodium (VOLTAREN) 1 % GEL Apply 2 g topically 3 (three) times daily as needed for pain.     ezetimibe (ZETIA) 10 MG tablet Take 1 tablet (10 mg total) by mouth daily. 90 tablet 3   fluticasone (FLONASE) 50 MCG/ACT nasal spray Place 1 spray into both nostrils daily as needed for allergies or rhinitis.     furosemide (LASIX) 40 MG tablet Take 40 mg by mouth See admin instructions. Take 1 tablet (40 mg) by mouth every other day alternating with 80 mg dosage     gabapentin (NEURONTIN) 100 MG capsule Take 1 capsule by mouth as needed.     GLIPIZIDE PO Take 5 mg by mouth as needed.     HYDROcodone bit-homatropine (HYCODAN) 5-1.5 MG/5ML syrup Take 5 mLs by mouth as needed.     HYDROcodone-acetaminophen (NORCO) 10-325 MG tablet TAKE 1 TABLET BY MOUTH THREE TIMES A DAY AS NEEDED FOR PAIN     HYDROcodone-acetaminophen (NORCO/VICODIN) 5-325 MG tablet Take 1 tablet by mouth every 6 (six) hours as needed for moderate pain or severe pain. 12 tablet 0   Meclizine HCl 25 MG CHEW CHEW ONE-HALF TABLET BY MOUTH TWICE A DAY FOR VERTIGO **DO  NOT TAKE WITH CETRIZINE     methocarbamol (ROBAXIN) 500 MG tablet Take 500 mg by mouth 3 (three) times daily as needed for muscle spasms.     Multiple Vitamin (MULTIVITAMIN) capsule Take 1 capsule by mouth in the morning.     mupirocin ointment (BACTROBAN) 2 % Apply 1 Application topically daily as needed (irritation).     nitroGLYCERIN (NITROSTAT) 0.4 MG SL tablet 1 TAB UNDER THE TONGUE EVERY 5 MINUTES AS NEEDED FOR CHEST PAIN UP TO 3 DOSES, IF PERSIST CALL 911 25 tablet 3   ONETOUCH VERIO test strip 1 each daily.     oxybutynin (DITROPAN) 5 MG tablet Take 1 tablet (5 mg total) by mouth every 8 (eight) hours as needed for bladder spasms. 30 tablet 1   pantoprazole (PROTONIX) 40 MG tablet Take 40 mg by mouth daily as needed (heartburn).     phenazopyridine (PYRIDIUM) 200 MG tablet Take 1 tablet (200 mg total) by mouth 3 (three) times daily as needed (for pain with urination). 30 tablet 0   potassium chloride SA (KLOR-CON M) 20 MEQ tablet Take 20 mEq by mouth daily.     Semaglutide,0.25 or 0.5MG /DOS, (OZEMPIC, 0.25 OR 0.5 MG/DOSE,) 2 MG/1.5ML SOPN Inject 0.5 mg into the skin once a week. 4.5 mL 3   tadalafil (CIALIS) 20 MG tablet 1 tablet Orally once a day if needed     testosterone (TESTIM) 50 MG/5GM (1%) GEL 1 packet to skin in the morning to shoulder, upper arms or abdomen Transdermal Once a day for 30 days     traZODone (DESYREL)  100 MG tablet Take 100 mg by mouth at bedtime.     No current facility-administered medications for this encounter.    Allergies  Allergen Reactions   Crestor [Rosuvastatin] Other (See Comments)    Muscle aches    Lipitor [Atorvastatin] Other (See Comments)    Muscle aches   Pravastatin Other (See Comments)    Muscle aches   Carvedilol Other (See Comments)    loss of appetite   Metformin Diarrhea   Serotonin Reuptake Inhibitors (Ssris) Other (See Comments)    Muscle aches   Zocor [Simvastatin] Other (See Comments)     Muscle pain   Codeine Itching and  Other (See Comments)    Extremity tingling-- can take synthetic    Social History   Socioeconomic History   Marital status: Married    Spouse name: Darl Pikes   Number of children: 1   Years of education: Not on file   Highest education level: Master's degree (e.g., MA, MS, MEng, MEd, MSW, MBA)  Occupational History    Comment: Radio broadcast assistant retired  Tobacco Use   Smoking status: Former    Types: Cigarettes   Smokeless tobacco: Never   Tobacco comments:    Former smoke 04/08/22  Vaping Use   Vaping Use: Never used  Substance and Sexual Activity   Alcohol use: Not Currently    Alcohol/week: 2.0 standard drinks of alcohol    Types: 1 Glasses of wine, 1 Shots of liquor per week    Comment: OCCASIONAL   Drug use: Never   Sexual activity: Not Currently  Other Topics Concern   Not on file  Social History Narrative   Lives with wife   occas soda   Social Determinants of Health   Financial Resource Strain: Not on file  Food Insecurity: No Food Insecurity (03/27/2021)   Hunger Vital Sign    Worried About Running Out of Food in the Last Year: Never true    Ran Out of Food in the Last Year: Never true  Transportation Needs: No Transportation Needs (04/15/2022)   PRAPARE - Administrator, Civil Service (Medical): No    Lack of Transportation (Non-Medical): No  Physical Activity: Not on file  Stress: Not on file  Social Connections: Not on file  Intimate Partner Violence: Not on file    Family History  Problem Relation Age of Onset   Other Mother        joint problems   Alzheimer's disease Mother    Hypertension Father    Heart disease Father    CVA Father        age 45   Depression Father    Heart disease Sister        CABG   Stroke Sister    Heart failure Sister        congestive   Diabetes Sister    Heart disease Brother    Hypertension Brother    Congestive Heart Failure Brother     ROS- All systems are reviewed and negative except as per the  HPI above  Physical Exam: Vitals:   12/22/22 1329  BP: 136/66  Pulse: 61  Weight: 90.7 kg  Height: 5\' 9"  (1.753 m)     Wt Readings from Last 3 Encounters:  12/22/22 90.7 kg  09/10/22 90.1 kg  09/08/22 84.4 kg    Labs: Lab Results  Component Value Date   NA 138 05/19/2022   K 4.1 05/19/2022   CL 104 05/19/2022  CO2 26 05/19/2022   GLUCOSE 125 (H) 05/19/2022   BUN 49 (H) 05/19/2022   CREATININE 4.06 (H) 05/19/2022   CALCIUM 9.9 05/19/2022   PHOS 3.7 01/10/2021   MG 2.0 01/10/2021   Lab Results  Component Value Date   INR 1.6 (H) 02/01/2021   Lab Results  Component Value Date   CHOL 94 04/21/2022   HDL 50 04/21/2022   LDLCALC 30 04/21/2022   TRIG 69 04/21/2022    GEN- The patient is a well appearing elderly male, alert and oriented x 3 today.   HEENT-head normocephalic, atraumatic, sclera clear, conjunctiva pink, hearing intact, trachea midline. Lungs- Clear to ausculation bilaterally, normal work of breathing Heart- Regular rate and rhythm, no murmurs, rubs or gallops  GI- soft, NT, ND, + BS Extremities- no clubbing, cyanosis, or edema MS- no significant deformity or atrophy Skin- no rash or lesion Psych- euthymic mood, full affect Neuro- strength and sensation are intact   EKG today demonstrates SR, RBBB, LAFB Vent. rate 61 BPM PR interval * ms QRS duration 176 ms QT/QTcB 490/493 ms   Echo 04/11/21  1. Left ventricular ejection fraction, by estimation, is 55 to 60%. The  left ventricle has normal function. The left ventricle has no regional  wall motion abnormalities. There is moderate asymmetric left ventricular hypertrophy of the basal-septal segment. Left ventricular diastolic parameters are indeterminate.   2. Right ventricular systolic function is normal. The right ventricular  size is normal. There is normal pulmonary artery systolic pressure.   3. Left atrial size was severely dilated.   4. Mean mitral valve gradient is 8 mmHg compared with  12 mmHg 02/2021. There is a filamentous, mobile density on the left atrial side of the mitral annulus. Previously noted on TEE 02/2021. No significant change. Likely an annuloplasty suture. The mitral valve has been repaired/replaced. Mild mitral valve regurgitation. Mild to moderate mitral stenosis. There is a 26 mm prosthetic annuloplasty ring  present in the mitral position. Procedure Date: 03/25/2016.   5. The aortic valve is tricuspid. There is mild calcification of the  aortic valve. There is mild thickening of the aortic valve. Aortic valve  regurgitation is not visualized. No aortic stenosis is present.   6. Aortic dilatation noted. There is mild dilatation of the ascending  aorta, measuring 39 mm.   7. The inferior vena cava is normal in size with greater than 50%  respiratory variability, suggesting right atrial pressure of 3 mmHg.   8. Evidence of atrial level shunting detected by color flow Doppler.  There is a large patent foramen ovale with predominantly left to right  shunting across the atrial septum.    CHA2DS2-VASc Score = 8  The patient's score is based upon: CHF History: 1 HTN History: 1 Diabetes History: 1 Stroke History: 2 Vascular Disease History: 1 Age Score: 2 Gender Score: 0       ASSESSMENT AND PLAN: 1. Persistent Atrial Fibrillation/atrial flutter/atrial tachycardia The patient's CHA2DS2-VASc score is 8, indicating a 10.8% annual risk of stroke.   Patient appears to be maintaining SR.  Continue amiodarone 200 mg daily Check cmet/TSH today. CXR in care everywhere reviewed.  Continue Eliquis 5 mg BID  2. Secondary Hypercoagulable State (ICD10:  D68.69) The patient is at significant risk for stroke/thromboembolism based upon his CHA2DS2-VASc Score of 8.  Continue Apixaban (Eliquis).   3. Chronic HFpEF Followed in [facility] Fluid status appears stable today.  4. CAD  S/p CABG On Praluent  No anginal symptoms.  5. MR S/p mitral valve repair 2017  6.  OSA Encouraged compliance with CPAP therapy.  7. HTN Stable, no changes today.   Follow up in the AF clinic in 6 months. Overdue for follow up in the [facility].   Jorja Loa PA-C Afib Clinic [facility] [address] [place], [place] [address] [phone]

## 2022-12-23 ENCOUNTER — Other Ambulatory Visit (HOSPITAL_COMMUNITY): Payer: Self-pay

## 2022-12-23 MED ORDER — EZETIMIBE 10 MG PO TABS: 10.0000 mg | ORAL_TABLET | Freq: Every day | ORAL | 3 refills | Status: DC

## 2023-01-01 DIAGNOSIS — M79671 Pain in right foot: Secondary | ICD-10-CM | POA: Diagnosis not present

## 2023-01-01 DIAGNOSIS — B351 Tinea unguium: Secondary | ICD-10-CM | POA: Diagnosis not present

## 2023-01-04 IMAGING — MR MR MRA HEAD W/O CM
1 series · 19 of 48 positions shown · non-contrast
Comparison: Comparison made with previous brain MRI from
02/01/2021.

CLINICAL DATA: Initial evaluation for neuro deficit, right-sided
weakness, small stroke on prior brain MRI.

EXAM:
MRA HEAD WITHOUT CONTRAST
TECHNIQUE: Angiographic images of the Circle of Willis were acquired using MRA
technique without intravenous contrast.

[Series 5: 3d cow · axial · 0.5mm · 0.41mm/px · z∈[-32,+48]mm · 19 of 172 slices shown]
[im 1/172]
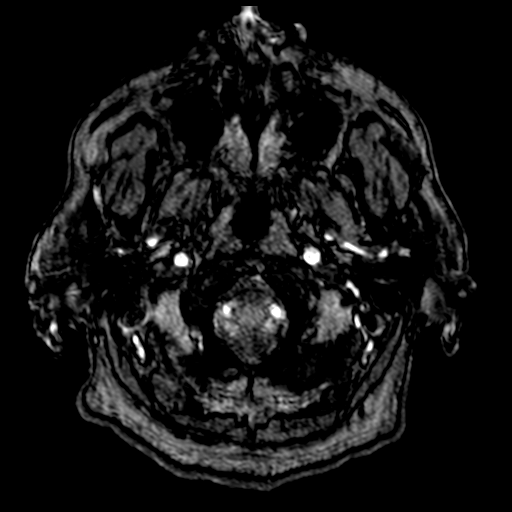
[im 4/172]
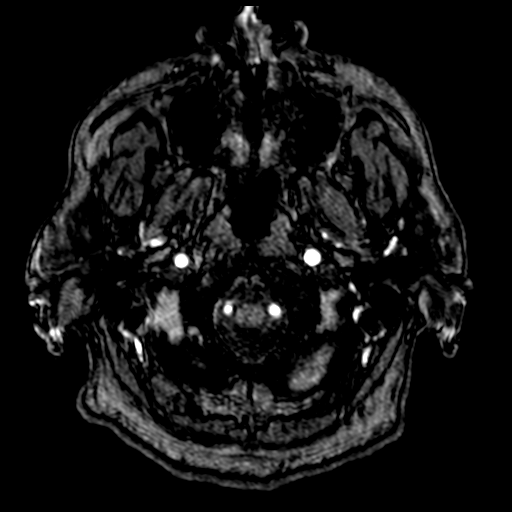
[im 8/172]
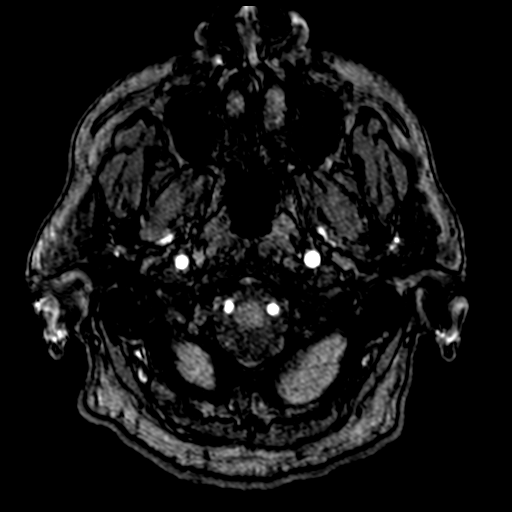
[im 11/172]
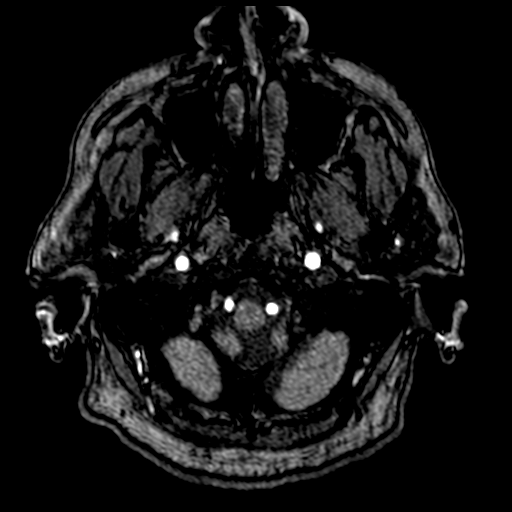
[im 15/172]
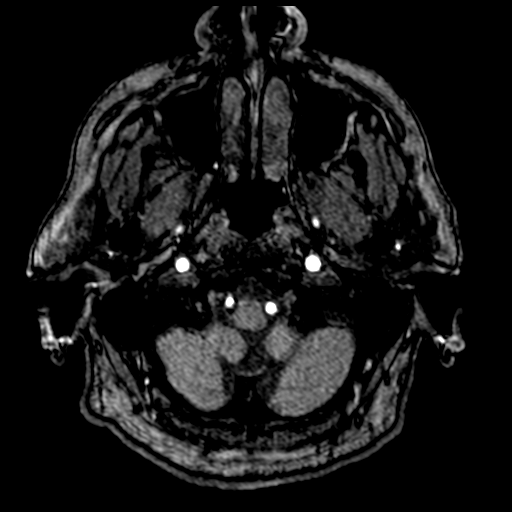
[im 19/172]
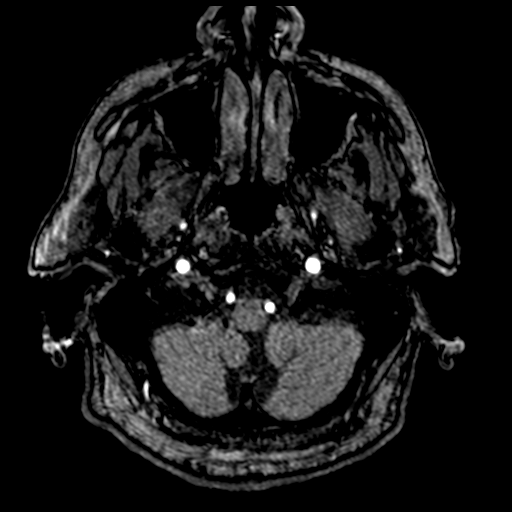
[im 22/172]
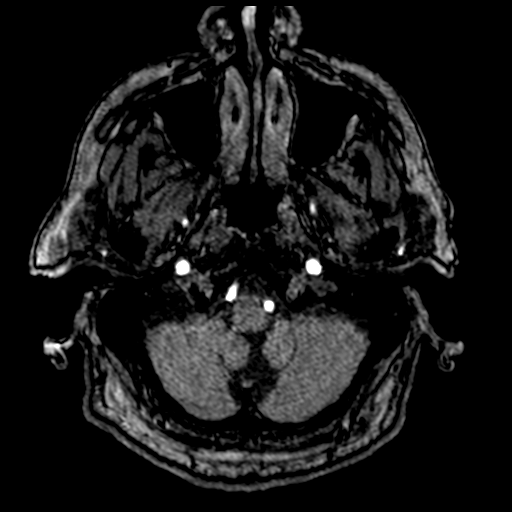
[im 26/172]
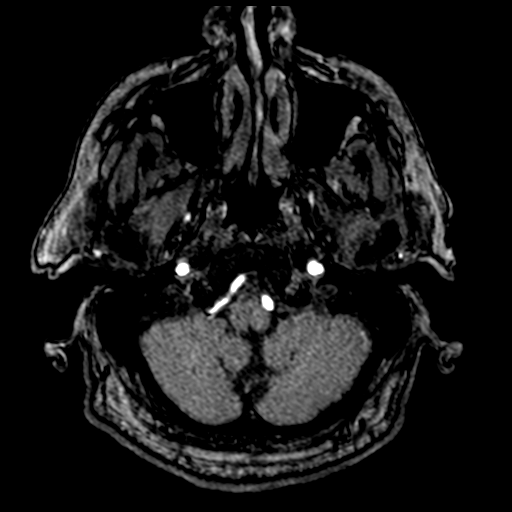
[im 30/172]
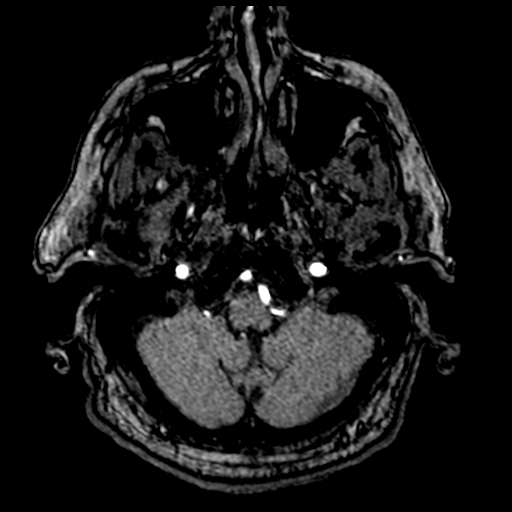
[im 33/172]
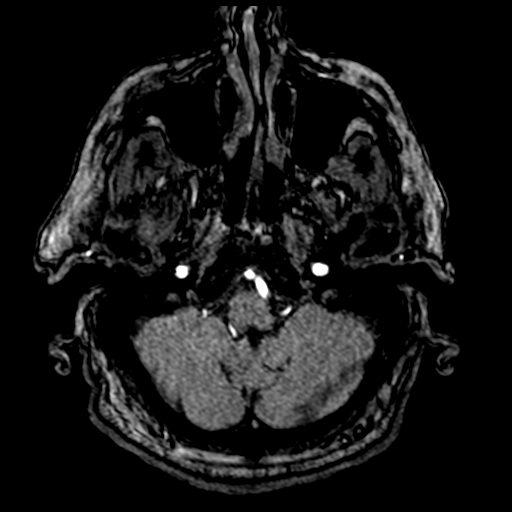
[im 37/172]
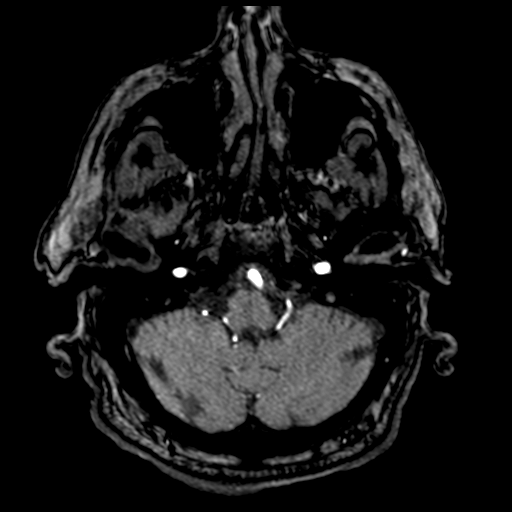
[im 55/172]
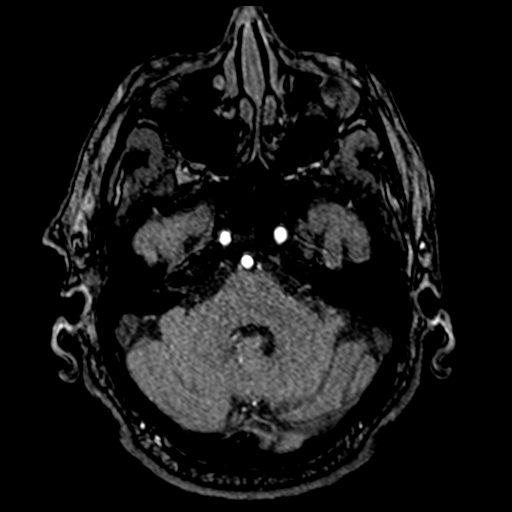
[im 77/172]
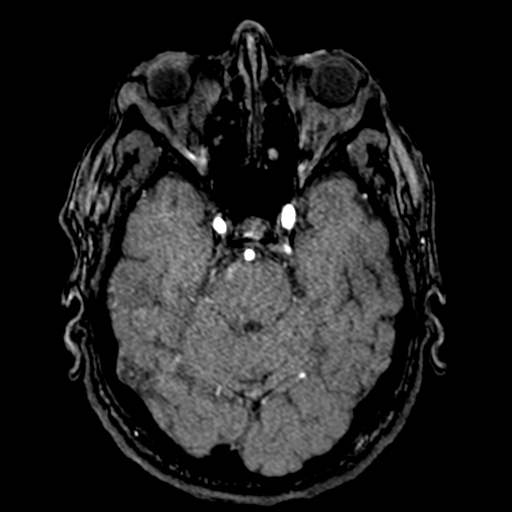
[im 88/172]
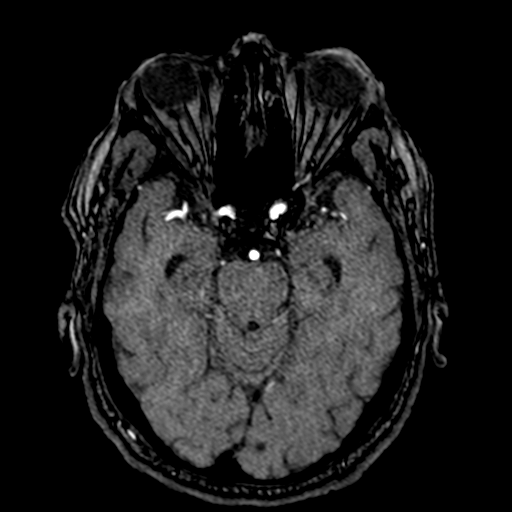
[im 99/172]
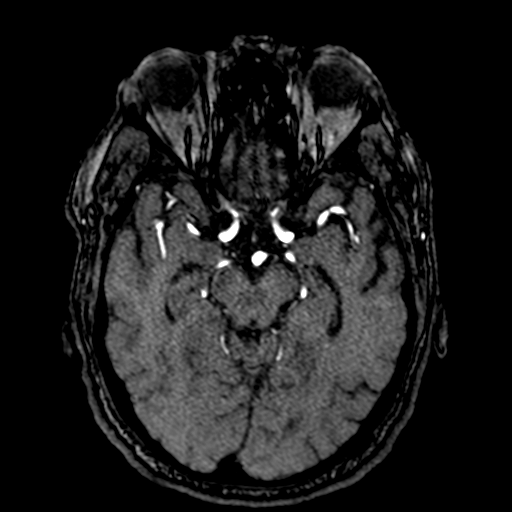
[im 121/172]
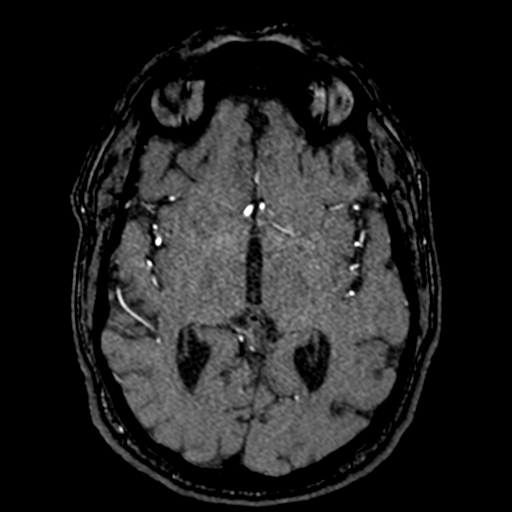
[im 142/172]
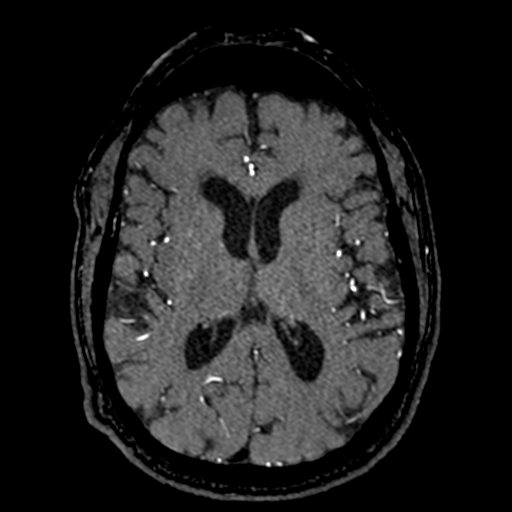
[im 146/172]
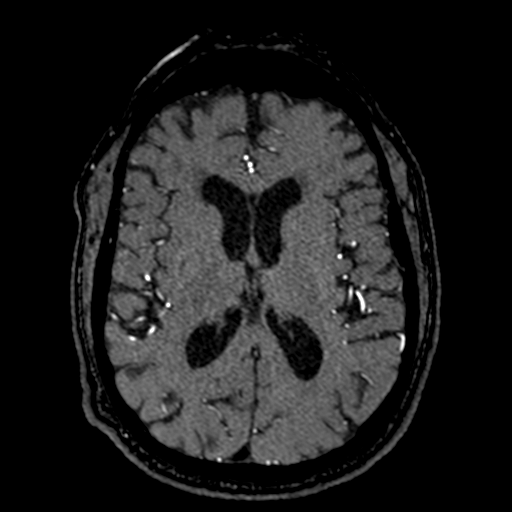
[im 164/172]
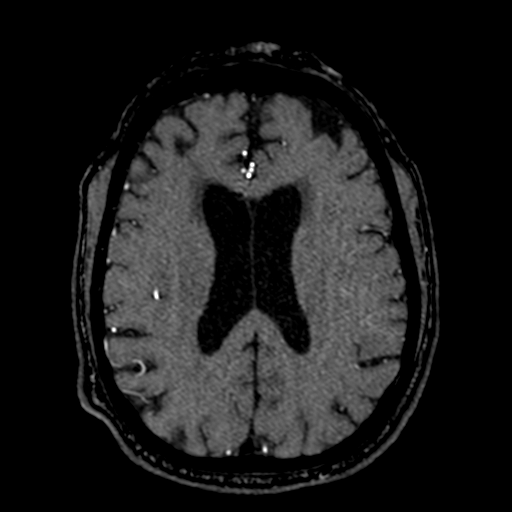

[19 of 48 positions shown; findings below may reference images not displayed]

FINDINGS: Anterior circulation: Visualized distal cervical segments of the
internal carotid arteries are widely patent with antegrade flow.
Petrous, cavernous, and supraclinoid segments widely patent without
stenosis or other abnormality. A1 segments patent bilaterally. Right
A1 hypoplastic, accounting for the slightly diminutive right ICA is
compared to the left. Tiny 1-2 mm outpouching arising from the mid
aspect of the anterior communicating artery complex suspicious for a
small aneurysm (series 5, image 110). Anterior cerebral arteries
patent to their distal aspects without stenosis. No M1 stenosis or
occlusion. Normal MCA bifurcations. Distal MCA branches well
perfused and symmetric.

Posterior circulation: Both V4 segments widely patent to the
vertebrobasilar junction without stenosis. Left vertebral artery
slightly dominant. Both PICA origins patent and normal. Basilar
widely patent to its distal aspect without stenosis. Superior
cerebellar arteries patent bilaterally. Both PCAs primarily supplied
via the basilar well perfused to their distal aspects without
stenosis.

Anatomic variants: Hypoplastic right A1 segment.

Other: None.
IMPRESSION: 1. Negative intracranial MRA with essentially normal appearance of
the medium and large vessels of the intracranial circulation. No
large vessel occlusion. No hemodynamically significant or
correctable stenosis.
2. Probable tiny 1-2 mm aneurysm arising from the mid aspect of the
anterior communicating artery complex.

## 2023-01-11 ENCOUNTER — Ambulatory Visit (INDEPENDENT_AMBULATORY_CARE_PROVIDER_SITE_OTHER): Payer: Medicare Other | Admitting: Internal Medicine

## 2023-01-11 ENCOUNTER — Encounter: Payer: Self-pay | Admitting: Internal Medicine

## 2023-01-11 VITALS — BP 138/78 | HR 73 | Ht 69.0 in | Wt 200.0 lb

## 2023-01-11 DIAGNOSIS — E1159 Type 2 diabetes mellitus with other circulatory complications: Secondary | ICD-10-CM | POA: Diagnosis not present

## 2023-01-11 DIAGNOSIS — E669 Obesity, unspecified: Secondary | ICD-10-CM | POA: Diagnosis not present

## 2023-01-11 DIAGNOSIS — Z7985 Long-term (current) use of injectable non-insulin antidiabetic drugs: Secondary | ICD-10-CM | POA: Diagnosis not present

## 2023-01-11 DIAGNOSIS — E782 Mixed hyperlipidemia: Secondary | ICD-10-CM | POA: Diagnosis not present

## 2023-01-11 LAB — POCT GLYCOSYLATED HEMOGLOBIN (HGB A1C): Hemoglobin A1C: 6.8 % — AB (ref 4.0–5.6)

## 2023-01-11 MED ORDER — FREESTYLE LIBRE 3 SENSOR MISC: 1.0000 | 3 refills | Status: DC

## 2023-01-11 MED ORDER — SEMAGLUTIDE(0.25 OR 0.5MG/DOS) 2 MG/3ML ~~LOC~~ SOPN
0.5000 mg | PEN_INJECTOR | SUBCUTANEOUS | 3 refills | Status: DC
Start: 2023-01-11 — End: 2023-07-15

## 2023-01-11 NOTE — Patient Instructions (Addendum)
Please continue: - Ozempic 0.5 mg weekly  You can take Glipizide 2.5-5 mg before a larger dinner or if you are on steroids.   Try to get the Freestyle Libre CGM.  Please return in 6 months with your sugar log or CGM.

## 2023-01-11 NOTE — Progress Notes (Signed)
Patient ID: [name], male   DOB: 1946-04-13, 77 y.o.   MRN: [id number]   HPI: [name] is a 77 y.o.-year-old male, initially referred by his nephrologist, Dr. 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 fib, CKD stage V, cerebrovascular disease, Aortic atherosclerosis, s/p TIA, ED).  Last visit 6 months ago.  Interim history: No blurry vision, nausea, chest pain but has chronic shortness of breath. He had back pain and a sinusitis >> was on steroid inj and ABx.  Reviewed HbA1c levels: Lab Results  Component Value Date   HGBA1C 6.8 (A) 09/10/2022   HGBA1C 6.8 (H) 05/19/2022   HGBA1C 5.9 (A) 02/25/2022   HGBA1C 6.3 (A) 10/30/2021   HGBA1C 6.4 (A) 06/19/2021   HGBA1C 6.2 (H) 02/02/2021   HGBA1C 6.3 (H) 01/07/2021   HGBA1C 6.0 (H) 10/05/2020   HGBA1C 5.9 (A) 01/29/2020   HGBA1C 7.1 (A) 09/25/2019  09/24/2021: HbA1c 5.8% 01/11/2019: HbA1c 8.3% 10/04/2018: HbA1c 8.2%  He is currently on: - Ozempic 0.5 mg weekly >> stopped 04/2020 >> restarted 05/2020 - through the [place]. - (Glipizide 2.5 to 5 mg as needed before a  larger dinner) - not using He came off metformin due to worsening kidney function. We stopped glipizide 09/2020 due to good control.  Pt checks his sugars occasionally: - am:80-135, 140 >> 89, 107-144, 155 >> 90-110, 120 >> 127,168 - 2h after b'fast: n/c >> 207 >> 90, 110 >> n/c >> 140 >> 153 - before lunch: n/c >> 102-130 >> n/c - 2h after lunch: n/c >> 120-130 >> n/c >> 134, 165 >> n/c >> 155 - before dinner: 129 >> n/c >> 86 >> n/c - 2h after dinner: n/c >> 190 >> n/c  - bedtime: n/c >> 77-122 >> n/c >> 120 - nighttime: n/c >> 180 (sick - steroids) Lowest sugar was 69 >> ... 89 >> 90 >> 115; he has hypoglycemia awareness in the 70s. Highest sugar was 299 >> .Marland Kitchen. 155 >> 180 >> 168  Pt's meals are: - Breakfast: egg and bacon (not lately).    - Brunch: meat + veggies - Dinner: same as lunch - Snacks: crackers, popcorn   -He has stage V CKD-sees nephrology-did not have to start dialysis yet but has a fistula placed; last BUN/creatinine:  Lab Results  Component Value Date   BUN 42 (H) 12/22/2022   BUN 49 (H) 05/19/2022   CREATININE 4.22 (H) 12/22/2022   CREATININE 4.06 (H) 05/19/2022  09/24/2021: 45/3.21, GFR 19, glucose 120 01/11/2019: 45//3.79, GFR 16, glucose 149, ACR 52.5 10/04/2018: 32/3.78, GFR 15, glucose 184 He is not on ACE inhibitor/ARB.  -+ HL; last set of lipids: Lab Results  Component Value Date   CHOL 94 04/21/2022   HDL 50 04/21/2022   LDLCALC 30 04/21/2022   TRIG 69 04/21/2022   CHOLHDL 1.9 04/21/2022  09/24/2021: 142/100/59/65 He developed generalized aches and pains from pravastatin.  Now restarted Zetia 12/2021 by cardiology.  He is currently also on Praluent.  - last eye exam was in 12/2022: Reportedly no DR ([facility]).   - no numbness and tingling in his feet. Last foot exam 09/10/2022.  Pt has FH of DM in sister and brother.  He also has hypertension. He has a stroke 12/2020 - weak R side of body, but improving. He was in PT. he started to see neurology. He was admitted 02/01/2021 for chest pain. He  had AKI. He has a history of lap band surgery 2010. Lost from 373 lbs to 215-220 lbs. He was admitted 03/2022 with ureteric stone and hydronephrosis along with AKI.  He subsequently had to have cystoscopy/ureteroscopy. 01/23/2021: B12 vitamin 733, TSH 1.83  ROS: C+ see HPI  I reviewed pt's medications, allergies, PMH, social hx, family hx, and changes were documented in the history of present illness. Otherwise, unchanged from my initial visit note.  Past Medical History:  Diagnosis Date   Allergic rhinitis    Anxiety    Atrial fibrillation (HCC)    Cancer (HCC)    hx of skin cancers    Chronic kidney disease    Coronary artery disease    a. s/p MI & CABG; b. s/p PCI Diag;  c. Cath 12/2011: LAD diff  dzs/small, Diag 75% isr, 90 dist to stent (small), LCX & RCA occluded, VG->OM 40, VG->PDA patent - med rx.   COVID-19    x 3   Depression    PTSD   Diabetes mellitus    not on medications   Diastolic CHF, chronic (HCC)    a. EF 55-60% by echo 2007   GERD (gastroesophageal reflux disease)    "not anymore" " I had a lap band"   History of diverticulitis of colon    5 YRS AGO   History of kidney stones    History of pneumonia    2017   History of transient ischemic attack (TIA)    CAROTID DOPPLERS NOV 2011  0-39& BIL. STENOSIS   Hyperlipidemia    Hypertension    Kidney failure    Mitral regurgitation    Myocardial infarction [facility])    Neuromuscular disorder (HCC)    left arm numbness    OA (osteoarthritis)    RIGHT KNEE ARTHOFIBROSIS W/ PAIN  (S/P  REPLACEMENT 2004)   Pneumonia    PTSD (post-traumatic stress disorder)    from [place] since 1973   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/25/2016   Complex valvuloplasty including artificial Gore-tex neochord placement x4, plication of anterior commissure, and 26 mm Sorin Memo 3D ring annuloplasty via right mini thoracotomy approach   Shortness of breath dyspnea    with exertion   Skin cancer    Sleep apnea    cpap- see ov note in EPIC 05/12/11 for settings    Stroke [facility])    hx of   Stroke [facility])    Past Surgical History:  Procedure Laterality Date   AV FISTULA PLACEMENT Left 08/02/2018   Procedure: ARTERIOVENOUS (AV) FISTULA CREATION RADIOCEPHALIC;  Surgeon: Chuck Hint, MD;  Location: [facility] OR;  Service: Vascular;  Laterality: Left;   BLEPHAROPLASTY  1985   BILATERAL   CARDIAC CATHETERIZATION  2001, 2004, 2009, 2011   CARDIAC CATHETERIZATION N/A 01/23/2016   Procedure: Right/Left Heart Cath and Coronary Angiography;  Surgeon: Laurey Morale, MD;  Location: [facility] INVASIVE CV LAB;  Service: Cardiovascular;  Laterality: N/A;   CARDIOVERSION N/A 09/07/2017   Procedure: CARDIOVERSION;  Surgeon: Laurey Morale, MD;   Location: [facility] ENDOSCOPY;  Service: Cardiovascular;  Laterality: N/A;   CARDIOVERSION N/A 09/13/2019   Procedure: CARDIOVERSION;  Surgeon: Laurey Morale, MD;  Location: [facility] ENDOSCOPY;  Service: Cardiovascular;  Laterality: N/A;   CARDIOVERSION N/A 10/19/2019   Procedure: CARDIOVERSION;  Surgeon: Laurey Morale, MD;  Location: [facility] ENDOSCOPY;  Service: Cardiovascular;  Laterality: N/A;   CARDIOVERSION N/A 04/14/2022   Procedure: CARDIOVERSION;  Surgeon: Laurey Morale, MD;  Location: [facility] ENDOSCOPY;  Service: Cardiovascular;  Laterality: N/A;   CATARACT EXTRACTION W/ INTRAOCULAR LENS IMPLANT Bilateral    CHONDROPLASTY  07/22/2011   Procedure: CHONDROPLASTY;  Surgeon: Loanne Drilling;  Location: [facility];  Service: Orthopedics;;   CIRCUMCISION  35 YRS  AGO   COLONOSCOPY     CORONARY ANGIOPLASTY  1996-- POST CABG   W/ STENT, last cath 01/07/2012    CORONARY ARTERY BYPASS GRAFT  1995   X3 VESSEL   CORONARY ARTERY BYPASS GRAFT  1995   CORONARY STENT PLACEMENT     CYSTOSCOPY W/ URETERAL STENT PLACEMENT Right 04/11/2022   Procedure: CYSTOSCOPY WITH RETROGRADE PYELOGRAM/URETERAL STENT PLACEMENT;  Surgeon: Malen Gauze, MD;  Location: [facility] ORS;  Service: Urology;  Laterality: Right;   CYSTOSCOPY/URETEROSCOPY/HOLMIUM LASER/STENT PLACEMENT Right 01/08/2021   Procedure: CYSTOSCOPY/RETROGRADE/URETEROSCOPY/HOLMIUM LASER/STENT PLACEMENT;  Surgeon: Marcine Matar, MD;  Location: [facility] ORS;  Service: Urology;  Laterality: Right;   CYSTOSCOPY/URETEROSCOPY/HOLMIUM LASER/STENT PLACEMENT Right 05/27/2022   Procedure: CYSTOSCOPY, URETEROSCOPY, HOLMIUM LASER, STENT EXCHANGE;  Surgeon: Rene Paci, MD;  Location: [facility] ORS;  Service: Urology;  Laterality: Right;   INCISION AND DRAINAGE PERIRECTAL ABSCESS N/A 09/01/2021   Procedure: IRRIGATION AND DEBRIDEMENT PERIRECTAL ABSCESS;  Surgeon: Sheliah Hatch De Blanch, MD;  Location: [facility] ORS;  Service: General;  Laterality: N/A;   IR PERC TUN PERIT  CATH WO PORT S&I Judi Cong  03/06/2021   IR REMOVAL TUN CV CATH W/O FL  04/14/2021   KNEE ARTHROSCOPY  07/22/2011   Procedure: ARTHROSCOPY KNEE;  Surgeon: Loanne Drilling;  Location: [facility];  Service: Orthopedics;  Laterality: Right;  WITH DEBRIDEMENT    KNEE ARTHROSCOPY W/ MENISCECTOMY  X2 IN 2002-- RIGHT KNEE   LAPAROSCOPIC GASTRIC BANDING  01-15-09   LEFT HEART CATH AND CORONARY ANGIOGRAPHY N/A 04/29/2017   Procedure: LEFT HEART CATH AND CORONARY ANGIOGRAPHY;  Surgeon: Laurey Morale, MD;  Location: [facility] INVASIVE CV LAB;  Service: Cardiovascular;  Laterality: N/A;   LEFT HEART CATHETERIZATION WITH CORONARY ANGIOGRAM N/A 09/11/2013   Procedure: LEFT HEART CATHETERIZATION WITH CORONARY ANGIOGRAM;  Surgeon: Laurey Morale, MD;  Location: [facility] CATH LAB;  Service: Cardiovascular;  Laterality: N/A;   MITRAL VALVE REPAIR Right 03/25/2016   Procedure: MINIMALLY INVASIVE REOPERATION FOR MITRAL VALVE REPAIR  (MVR) with size 26 Sorin Memo 3D;  Surgeon: Purcell Nails, MD;  Location: [facility] OR;  Service: Open Heart Surgery;  Laterality: Right;   MITRAL VALVE REPAIR  03/2016   RIGHT KNEE ED COMPARTMENT REPLACEMENT  2004   SYNOVECTOMY  07/22/2011   Procedure: SYNOVECTOMY;  Surgeon: Gus Rankin Aluisio;  Location: [facility];  Service: Orthopedics;;   TEE WITHOUT CARDIOVERSION N/A 01/23/2016   Procedure: TRANSESOPHAGEAL ECHOCARDIOGRAM (TEE);  Surgeon: Laurey Morale, MD;  Location: [facility] ENDOSCOPY;  Service: Cardiovascular;  Laterality: N/A;   TEE WITHOUT CARDIOVERSION N/A 03/25/2016   Procedure: TRANSESOPHAGEAL ECHOCARDIOGRAM (TEE);  Surgeon: Purcell Nails, MD;  Location: [facility] OR;  Service: Open Heart Surgery;  Laterality: N/A;   TEE WITHOUT CARDIOVERSION N/A 03/04/2021   Procedure: TRANSESOPHAGEAL ECHOCARDIOGRAM (TEE);  Surgeon: Lewayne Bunting, MD;  Location: [facility] ENDOSCOPY;  Service: Cardiovascular;  Laterality: N/A;   TEE WITHOUT CARDIOVERSION N/A 04/14/2022   Procedure: TRANSESOPHAGEAL  ECHOCARDIOGRAM (TEE);  Surgeon: Laurey Morale, MD;  Location: [facility] ENDOSCOPY;  Service: Cardiovascular;  Laterality: N/A;   UMBILICAL HERNIA REPAIR  01-15-09   W/ GASTRIC BANDING PROCEDURE   Social History   Socioeconomic History   Marital status:  Married    Spouse name: Not on file   Number of children: 1   Years of education: Not on file   Highest education level: Not on file  Occupational History   Contractor, semi-retired  Social Needs   Financial resource strain: Not on file   Food insecurity:    Worry: Not on file    Inability: Not on file   Transportation needs:    Medical: Not on file    Non-medical: Not on file  Tobacco Use   Smoking status: Never Smoker   Smokeless tobacco: Never Used  Substance and Sexual Activity   Alcohol use: Yes, liquor    Comment: OCCASIONAL   Drug use: No   Sexual activity: Not on file  Lifestyle   Physical activity:    Days per week: Not on file    Minutes per session: Not on file   Stress: Not on file  Relationships   Social connections:    Talks on phone: Not on file    Gets together: Not on file    Attends religious service: Not on file    Active member of club or organization: Not on file    Attends meetings of clubs or organizations: Not on file    Relationship status: Not on file   Intimate partner violence:    Fear of current or ex partner: Not on file    Emotionally abused: Not on file    Physically abused: Not on file    Forced sexual activity: Not on file  Other Topics Concern   Not on file  Social History Narrative   Not on file   Current Outpatient Medications on File Prior to Visit  Medication Sig Dispense Refill   albuterol (VENTOLIN HFA) 108 (90 Base) MCG/ACT inhaler SMARTSIG:1-2 Puff(s) By Mouth Every 4-6 Hours PRN     Alirocumab (PRALUENT) 75 MG/ML SOAJ Inject 75 mg into the skin every 14 (fourteen) days.     allopurinol (ZYLOPRIM) 100 MG tablet Take 50 mg by mouth 2 (two) times a week.     amiodarone  (PACERONE) 200 MG tablet Take 1 tablet (200 mg total) by mouth in the morning.     ANDROGEL PUMP 20.25 MG/ACT (1.62%) GEL SMARTSIG:2 Pump Topical Daily     apixaban (ELIQUIS) 5 MG TABS tablet Take 1 tablet (5 mg total) by mouth 2 (two) times daily. 180 tablet 3   ARTIFICIAL SALIVA MT Swish and swallow 1-2 tsp Mouth/Throat once a day if needed     betamethasone valerate lotion (VALISONE) 0.1 % 1 application Externally once a day if needed     busPIRone (BUSPAR) 10 MG tablet Take 5 mg by mouth 2 (two) times daily.     Carboxymethylcellulose Sodium 1 % GEL 1 drop Ophthalmic once a day if needed     cetirizine (ZYRTEC) 10 MG tablet Take 10 mg by mouth in the morning.     chlorpheniramine-HYDROcodone (TUSSIONEX) 10-8 MG/5ML Take 5 mLs by mouth as needed.     colchicine 0.6 MG tablet Take 0.6-1.2 mg by mouth 2 (two) times daily as needed (gout).      Continuous Blood Gluc Sensor (DEXCOM G7 SENSOR) MISC 3 each by Does not apply route every 30 (thirty) days. Apply 1 sensor every 10 days 9 each 4   Cyanocobalamin (VITAMIN B-12) 5000 MCG TBDP Take 5,000 mcg by mouth 2 (two) times a week.     cycloSPORINE (RESTASIS) 0.05 % ophthalmic emulsion Place 1 drop into both  eyes 2 (two) times daily as needed (eye irritation.).     diclofenac Sodium (VOLTAREN) 1 % GEL Apply 2 g topically 3 (three) times daily as needed for pain.     ezetimibe (ZETIA) 10 MG tablet Take 1 tablet (10 mg total) by mouth daily. 90 tablet 3   fluticasone (FLONASE) 50 MCG/ACT nasal spray Place 1 spray into both nostrils daily as needed for allergies or rhinitis.     furosemide (LASIX) 40 MG tablet Take 40 mg by mouth See admin instructions. Take 1 tablet (40 mg) by mouth every other day alternating with 80 mg dosage     gabapentin (NEURONTIN) 100 MG capsule Take 1 capsule by mouth as needed.     GLIPIZIDE PO Take 5 mg by mouth as needed.     HYDROcodone bit-homatropine (HYCODAN) 5-1.5 MG/5ML syrup Take 5 mLs by mouth as needed.      HYDROcodone-acetaminophen (NORCO) 10-325 MG tablet TAKE 1 TABLET BY MOUTH THREE TIMES A DAY AS NEEDED FOR PAIN     HYDROcodone-acetaminophen (NORCO/VICODIN) 5-325 MG tablet Take 1 tablet by mouth every 6 (six) hours as needed for moderate pain or severe pain. 12 tablet 0   Meclizine HCl 25 MG CHEW CHEW ONE-HALF TABLET BY MOUTH TWICE A DAY FOR VERTIGO **DO NOT TAKE WITH CETRIZINE     methocarbamol (ROBAXIN) 500 MG tablet Take 500 mg by mouth 3 (three) times daily as needed for muscle spasms.     Multiple Vitamin (MULTIVITAMIN) capsule Take 1 capsule by mouth in the morning.     mupirocin ointment (BACTROBAN) 2 % Apply 1 Application topically daily as needed (irritation).     nitroGLYCERIN (NITROSTAT) 0.4 MG SL tablet 1 TAB UNDER THE TONGUE EVERY 5 MINUTES AS NEEDED FOR CHEST PAIN UP TO 3 DOSES, IF PERSIST CALL 911 25 tablet 3   ONETOUCH VERIO test strip 1 each daily.     oxybutynin (DITROPAN) 5 MG tablet Take 1 tablet (5 mg total) by mouth every 8 (eight) hours as needed for bladder spasms. 30 tablet 1   pantoprazole (PROTONIX) 40 MG tablet Take 40 mg by mouth daily as needed (heartburn).     phenazopyridine (PYRIDIUM) 200 MG tablet Take 1 tablet (200 mg total) by mouth 3 (three) times daily as needed (for pain with urination). 30 tablet 0   potassium chloride SA (KLOR-CON M) 20 MEQ tablet Take 20 mEq by mouth daily.     Semaglutide,0.25 or 0.5MG /DOS, (OZEMPIC, 0.25 OR 0.5 MG/DOSE,) 2 MG/1.5ML SOPN Inject 0.5 mg into the skin once a week. 4.5 mL 3   tadalafil (CIALIS) 20 MG tablet 1 tablet Orally once a day if needed     testosterone (TESTIM) 50 MG/5GM (1%) GEL 1 packet to skin in the morning to shoulder, upper arms or abdomen Transdermal Once a day for 30 days     traZODone (DESYREL) 100 MG tablet Take 100 mg by mouth at bedtime.     No current facility-administered medications on file prior to visit.   Allergies  Allergen Reactions   Crestor [Rosuvastatin] Other (See Comments)    Muscle  aches    Lipitor [Atorvastatin] Other (See Comments)    Muscle aches   Pravastatin Other (See Comments)    Muscle aches   Carvedilol Other (See Comments)    loss of appetite   Metformin Diarrhea   Serotonin Reuptake Inhibitors (Ssris) Other (See Comments)    Muscle aches   Zocor [Simvastatin] Other (See Comments)  Muscle pain   Codeine Itching and Other (See Comments)    Extremity tingling-- can take synthetic   Family History  Problem Relation Age of Onset   Other Mother        joint problems   Alzheimer's disease Mother    Hypertension Father    Heart disease Father    CVA Father        age 57   Depression Father    Heart disease Sister        CABG   Stroke Sister    Heart failure Sister        congestive   Diabetes Sister    Heart disease Brother    Hypertension Brother    Congestive Heart Failure Brother    PE: BP 138/78 (BP Location: Right Arm, Patient Position: Sitting, Cuff Size: Normal)   Pulse 73   Ht 5\' 9"  (1.753 m)   Wt 200 lb (90.7 kg)   SpO2 98%   BMI 29.53 kg/m  Wt Readings from Last 3 Encounters:  01/11/23 200 lb (90.7 kg)  12/22/22 200 lb (90.7 kg)  09/10/22 198 lb 9.6 oz (90.1 kg)   Constitutional: overweight, in NAD Eyes: EOMI, no exophthalmos ENT: no thyromegaly, no cervical lymphadenopathy Cardiovascular: RRR, No MRG Respiratory: CTA B  Musculoskeletal: no deformities Skin: ecchymoses on bilateral dorsum of hands Neurological: + tremor with outstretched R hand  ASSESSMENT: 1. DM2, non-insulin-dependent, uncontrolled, with long-term complications - CAD, s/p coronaroplasty, then CABG 1995 - cardiologist - Dr. Shirlee Latch - CHF, EF 55 to 65% - Atrial fibrillation, cardioversion in 2019 - Carotid artery disease - Cerebrovascular disease, s/p TIA, s/p stroke 12/2020 - Ao ATS per abdominal CT (08/31/2021) - CKD stage V, pending dialysis -Dr. Darrick Penna - ED  2. HL  3.  Obesity class I  PLAN:  1. Patient with longstanding, previously  uncontrolled type 2 diabetes, with improved control in the last 3 years.  He had to come off metformin due to the declining kidney function.  We also stopped sulfonylurea due to good control but we did discuss about adding half a tablet to 1 tablet before a larger dinner.  I also suggested a CGM and was planning to get the CGM through the [place].  However, he is not on the CGM right now.  At last visit HbA1c was stable, at 6.8%, at goal.  We did not change his regimen. - at today's visit, he is not checking sugars consistently.  We only have a few sugars in his log and most are above target.  We discussed about the importance of taking this consistently.  Unfortunately, the [place] did not approve a CGM for him but the patient would want me to send this again to the [place] pharmacy.  This time, I sent a prescription for the freestyle libre 3 CGM, while previously we sent the Dexcom G7. -As of now, I do not have enough data to change his regimen especially in the setting of a stable HbA1c, at goal.  I did advise him to take his glipizide before a larger dinner.  He is not taking this for now. - I suggested to:  Patient Instructions  Please continue: - Ozempic 0.5 mg weekly  You can take Glipizide 2.5-5 mg before a larger dinner.  Please return in 6 months with your sugar log or CGM.   - we checked his HbA1c: 6.8% (stable) - advised to check sugars at different times of the day - 1x a day,  rotating check times - advised for yearly eye exams >> he is UTD - return to clinic in 6 months  2. HL -Reviewed latest lipid panel from 03/2022: Fractions at goal: Lab Results  Component Value Date   CHOL 94 04/21/2022   HDL 50 04/21/2022   LDLCALC 30 04/21/2022   TRIG 69 04/21/2022   CHOLHDL 1.9 04/21/2022  -He is on Zetia 10 mg daily, which was restarted after the above returned and also Praluent 75 mg every 2 weeks.  He had side effects from statins in the past.  3.  Obesity class I -Will continue Ozempic which  should also help with weight loss -He gained 2 pounds since last visit -He lost 2 pounds since then  Carlus Pavlov, MD PhD [facility] Endocrinology

## 2023-01-14 ENCOUNTER — Inpatient Hospital Stay (HOSPITAL_COMMUNITY)
Admission: RE | Admit: 2023-01-14 | Discharge: 2023-01-14 | Disposition: A | Discharge status: Home / Self Care | Payer: Medicare Other | Source: Ambulatory Visit | Attending: Physician Assistant | Admitting: Physician Assistant

## 2023-01-14 ENCOUNTER — Ambulatory Visit (HOSPITAL_COMMUNITY)
Admission: RE | Admit: 2023-01-14 | Discharge: 2023-01-14 | Disposition: A | Discharge status: Home / Self Care | Payer: Medicare Other | Source: Ambulatory Visit | Attending: Physician Assistant | Admitting: Physician Assistant

## 2023-01-14 VITALS — BP 136/74 | HR 62 | Ht 69.0 in | Wt 198.8 lb

## 2023-01-14 DIAGNOSIS — I4819 Other persistent atrial fibrillation: Secondary | ICD-10-CM | POA: Diagnosis not present

## 2023-01-14 DIAGNOSIS — D6869 Other thrombophilia: Secondary | ICD-10-CM | POA: Diagnosis not present

## 2023-01-14 DIAGNOSIS — Z5181 Encounter for therapeutic drug level monitoring: Secondary | ICD-10-CM

## 2023-01-14 DIAGNOSIS — I2510 Atherosclerotic heart disease of native coronary artery without angina pectoris: Secondary | ICD-10-CM | POA: Diagnosis not present

## 2023-01-14 DIAGNOSIS — I4719 Other supraventricular tachycardia: Secondary | ICD-10-CM | POA: Diagnosis not present

## 2023-01-14 DIAGNOSIS — R002 Palpitations: Secondary | ICD-10-CM

## 2023-01-14 DIAGNOSIS — I5032 Chronic diastolic (congestive) heart failure: Secondary | ICD-10-CM | POA: Insufficient documentation

## 2023-01-14 DIAGNOSIS — Z79899 Other long term (current) drug therapy: Secondary | ICD-10-CM | POA: Diagnosis not present

## 2023-01-14 DIAGNOSIS — Z7901 Long term (current) use of anticoagulants: Secondary | ICD-10-CM | POA: Diagnosis not present

## 2023-01-14 DIAGNOSIS — Z951 Presence of aortocoronary bypass graft: Secondary | ICD-10-CM | POA: Insufficient documentation

## 2023-01-14 DIAGNOSIS — I130 Hypertensive heart and chronic kidney disease with heart failure and stage 1 through stage 4 chronic kidney disease, or unspecified chronic kidney disease: Secondary | ICD-10-CM | POA: Diagnosis not present

## 2023-01-14 DIAGNOSIS — N189 Chronic kidney disease, unspecified: Secondary | ICD-10-CM | POA: Diagnosis not present

## 2023-01-14 DIAGNOSIS — I4892 Unspecified atrial flutter: Secondary | ICD-10-CM | POA: Diagnosis not present

## 2023-01-14 DIAGNOSIS — E1122 Type 2 diabetes mellitus with diabetic chronic kidney disease: Secondary | ICD-10-CM | POA: Insufficient documentation

## 2023-01-14 DIAGNOSIS — Z8616 Personal history of COVID-19: Secondary | ICD-10-CM | POA: Diagnosis not present

## 2023-01-14 DIAGNOSIS — G4733 Obstructive sleep apnea (adult) (pediatric): Secondary | ICD-10-CM | POA: Insufficient documentation

## 2023-01-14 NOTE — Progress Notes (Signed)
Primary Care Physician: Daisy Floro, MD Referring Physician: Lenn Sink Cardiologist: Dr. 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 In 2017, he developed increased exertional dyspnea.  Eventually had a TEE showing severe MR with prolapse of a portion of the anterior mitral valve leaflet.  Coronary angiography in 6/17 showed patent grafts and 90% stenosis of distal diagonal not amenable to intervention.  In 8/17, he had mitral valve repair.  Post-op diastolic CHF, Lasix started and increased.   He developed atrial arrhythmias in 12/18.  Both a. fibrillation and flutter were seen. Saw Dr. Johney Frame, recommended DCCV,   amiodarone or sotalol if fibrillation recurred shortly after cardioversion.  He had DCCV in 1/19 and  has been in SR for the last 2 years.Marland Kitchen   He is now in afib clinic as he was seen in the [place] yesterday and was noted to be in rate controlled afib. Was referred here. He continues in rate controlled afib today. He was unaware that he was out of rhythm, but now looking back, he feels that he has had fatigue for around 2 weeks.He does not know of an trigger other than  has had some issues with gout. No  missed anticoagulation for at least 3 weeks.  F/u in afib clinic, 08/2819. He had a successful cardioversion and felt well for the day, then started feeling worse the next day. He is back in rate controlled afib.  F/u in afib clinic, 10/12/19. He has been loading on amiodarone 200 mg bid. He remians in afib with CVR so  will plan for cardioversion. He states no missed doses of eliquis  5 mg bid for the last 3 weeks. He feels very fatigued in afib.   Follow up in the AF clinic 04/08/22. Patient reports that 04/03/22 he noted increased fatigue with exertion. He and his wife also noticed an irregular heart beat. ECG today shows rate controlled afib. There were no specific triggers that he could identify.   Follow  up in the AF clinic 06/22/22. Patient is s/p TEE guided DCCV 04/14/22 during a hospitalization for an obstructing kidney stone. He reports that he has done well since that time with no further afib. No bleeding issues on anticoagulation.   Follow up in the AF clinic 12/22/22. Patient reports that he feels fatigued most of the time. He is in SR today. He did have some atypical chest discomfort but this resolved after having trigger point injections with his orthopedist. No bleeding issues on anticoagulation.   Follow up in the AF clinic 01/14/23. Patient reports that he was at his PCP office at the [facility] and was back in afib. An ECG was done but is not available for review today. On ECG today, he is in SR. Patient reports that he is chronically fatigued, this has been an ongoing issue for years.   Today, he denies symptoms of palpitations, chest pain, orthopnea, PND, lower extremity edema, dizziness, presyncope, syncope, or neurologic sequela. The patient is tolerating medications without difficulties and is otherwise without complaint today.   Past Medical History:  Diagnosis Date   Allergic rhinitis    Anxiety    Atrial fibrillation (HCC)    Cancer (HCC)    hx of skin cancers    Chronic kidney disease    Coronary artery disease    a. s/p MI & CABG; b. s/p PCI Diag;  c. Cath 12/2011: LAD diff dzs/small, Diag 75% isr, 90 dist to stent (small), LCX & RCA occluded, VG->OM 40, VG->PDA patent - med rx.   COVID-19    x 3   Depression    PTSD   Diabetes mellitus    not on medications   Diastolic CHF, chronic (HCC)    a. EF 55-60% by echo 2007   GERD (gastroesophageal reflux disease)    "not anymore" " I had a lap band"   History of diverticulitis of colon    5 YRS AGO   History of kidney stones    History of pneumonia    2017   History of transient ischemic attack (TIA)    CAROTID DOPPLERS NOV 2011  0-39& BIL. STENOSIS   Hyperlipidemia    Hypertension    Kidney failure    Mitral regurgitation     Myocardial infarction [facility])    Neuromuscular disorder (HCC)    left arm numbness    OA (osteoarthritis)    RIGHT KNEE ARTHOFIBROSIS W/ PAIN  (S/P  REPLACEMENT 2004)   Pneumonia    PTSD (post-traumatic stress disorder)    from [place] since 1973   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/25/2016   Complex valvuloplasty including artificial Gore-tex neochord placement x4, plication of anterior commissure, and 26 mm Sorin Memo 3D ring annuloplasty via right mini thoracotomy approach   Shortness of breath dyspnea    with exertion   Skin cancer    Sleep apnea    cpap- see ov note in EPIC 05/12/11 for settings    Stroke [facility])    hx of   Stroke [facility])    Past Surgical History:  Procedure Laterality Date   AV FISTULA PLACEMENT Left 08/02/2018   Procedure: ARTERIOVENOUS (AV) FISTULA CREATION RADIOCEPHALIC;  Surgeon: Chuck Hint, MD;  Location: [facility] OR;  Service: Vascular;  Laterality: Left;   BLEPHAROPLASTY  1985   BILATERAL   CARDIAC CATHETERIZATION  2001, 2004, 2009, 2011   CARDIAC CATHETERIZATION N/A 01/23/2016   Procedure: Right/Left Heart Cath and Coronary Angiography;  Surgeon: Laurey Morale, MD;  Location: [facility] INVASIVE CV LAB;  Service: Cardiovascular;  Laterality: N/A;   CARDIOVERSION N/A 09/07/2017   Procedure: CARDIOVERSION;  Surgeon: Laurey Morale, MD;  Location: [facility] ENDOSCOPY;  Service: Cardiovascular;  Laterality: N/A;   CARDIOVERSION N/A 09/13/2019   Procedure: CARDIOVERSION;  Surgeon: Laurey Morale, MD;  Location: [facility] ENDOSCOPY;  Service: Cardiovascular;  Laterality: N/A;   CARDIOVERSION N/A 10/19/2019   Procedure: CARDIOVERSION;  Surgeon: Laurey Morale, MD;  Location: [facility] ENDOSCOPY;  Service: Cardiovascular;  Laterality: N/A;   CARDIOVERSION N/A 04/14/2022   Procedure: CARDIOVERSION;  Surgeon: Laurey Morale, MD;  Location: [facility] ENDOSCOPY;  Service: Cardiovascular;  Laterality: N/A;   CATARACT EXTRACTION W/ INTRAOCULAR LENS IMPLANT Bilateral     CHONDROPLASTY  07/22/2011   Procedure: CHONDROPLASTY;  Surgeon: Loanne Drilling;  Location: [facility];  Service: Orthopedics;;   CIRCUMCISION  35 YRS  AGO   COLONOSCOPY     CORONARY ANGIOPLASTY  1996-- POST CABG   W/ STENT, last cath 01/07/2012    CORONARY ARTERY BYPASS GRAFT  1995   X3 VESSEL   CORONARY ARTERY BYPASS GRAFT  1995   CORONARY STENT PLACEMENT     CYSTOSCOPY W/ URETERAL STENT PLACEMENT Right 04/11/2022   Procedure: CYSTOSCOPY WITH RETROGRADE PYELOGRAM/URETERAL STENT PLACEMENT;  Surgeon: Malen Gauze, MD;  Location: [facility] ORS;  Service: Urology;  Laterality: Right;  CYSTOSCOPY/URETEROSCOPY/HOLMIUM LASER/STENT PLACEMENT Right 01/08/2021   Procedure: CYSTOSCOPY/RETROGRADE/URETEROSCOPY/HOLMIUM LASER/STENT PLACEMENT;  Surgeon: Marcine Matar, MD;  Location: [facility] ORS;  Service: Urology;  Laterality: Right;   CYSTOSCOPY/URETEROSCOPY/HOLMIUM LASER/STENT PLACEMENT Right 05/27/2022   Procedure: CYSTOSCOPY, URETEROSCOPY, HOLMIUM LASER, STENT EXCHANGE;  Surgeon: Rene Paci, MD;  Location: [facility] ORS;  Service: Urology;  Laterality: Right;   INCISION AND DRAINAGE PERIRECTAL ABSCESS N/A 09/01/2021   Procedure: IRRIGATION AND DEBRIDEMENT PERIRECTAL ABSCESS;  Surgeon: Sheliah Hatch De Blanch, MD;  Location: [facility] ORS;  Service: General;  Laterality: N/A;   IR PERC TUN PERIT CATH WO PORT S&I Judi Cong  03/06/2021   IR REMOVAL TUN CV CATH W/O FL  04/14/2021   KNEE ARTHROSCOPY  07/22/2011   Procedure: ARTHROSCOPY KNEE;  Surgeon: Loanne Drilling;  Location: [facility];  Service: Orthopedics;  Laterality: Right;  WITH DEBRIDEMENT    KNEE ARTHROSCOPY W/ MENISCECTOMY  X2 IN 2002-- RIGHT KNEE   LAPAROSCOPIC GASTRIC BANDING  01-15-09   LEFT HEART CATH AND CORONARY ANGIOGRAPHY N/A 04/29/2017   Procedure: LEFT HEART CATH AND CORONARY ANGIOGRAPHY;  Surgeon: Laurey Morale, MD;  Location: [facility] INVASIVE CV LAB;  Service: Cardiovascular;  Laterality: N/A;   LEFT HEART  CATHETERIZATION WITH CORONARY ANGIOGRAM N/A 09/11/2013   Procedure: LEFT HEART CATHETERIZATION WITH CORONARY ANGIOGRAM;  Surgeon: Laurey Morale, MD;  Location: [facility] CATH LAB;  Service: Cardiovascular;  Laterality: N/A;   MITRAL VALVE REPAIR Right 03/25/2016   Procedure: MINIMALLY INVASIVE REOPERATION FOR MITRAL VALVE REPAIR  (MVR) with size 26 Sorin Memo 3D;  Surgeon: Purcell Nails, MD;  Location: [facility] OR;  Service: Open Heart Surgery;  Laterality: Right;   MITRAL VALVE REPAIR  03/2016   RIGHT KNEE ED COMPARTMENT REPLACEMENT  2004   SYNOVECTOMY  07/22/2011   Procedure: SYNOVECTOMY;  Surgeon: Gus Rankin Aluisio;  Location: [facility];  Service: Orthopedics;;   TEE WITHOUT CARDIOVERSION N/A 01/23/2016   Procedure: TRANSESOPHAGEAL ECHOCARDIOGRAM (TEE);  Surgeon: Laurey Morale, MD;  Location: [facility] ENDOSCOPY;  Service: Cardiovascular;  Laterality: N/A;   TEE WITHOUT CARDIOVERSION N/A 03/25/2016   Procedure: TRANSESOPHAGEAL ECHOCARDIOGRAM (TEE);  Surgeon: Purcell Nails, MD;  Location: [facility] OR;  Service: Open Heart Surgery;  Laterality: N/A;   TEE WITHOUT CARDIOVERSION N/A 03/04/2021   Procedure: TRANSESOPHAGEAL ECHOCARDIOGRAM (TEE);  Surgeon: Lewayne Bunting, MD;  Location: [facility] ENDOSCOPY;  Service: Cardiovascular;  Laterality: N/A;   TEE WITHOUT CARDIOVERSION N/A 04/14/2022   Procedure: TRANSESOPHAGEAL ECHOCARDIOGRAM (TEE);  Surgeon: Laurey Morale, MD;  Location: [facility] ENDOSCOPY;  Service: Cardiovascular;  Laterality: N/A;   UMBILICAL HERNIA REPAIR  01-15-09   W/ GASTRIC BANDING PROCEDURE    Current Outpatient Medications  Medication Sig Dispense Refill   albuterol (VENTOLIN HFA) 108 (90 Base) MCG/ACT inhaler SMARTSIG:1-2 Puff(s) By Mouth Every 4-6 Hours PRN     Alirocumab (PRALUENT) 75 MG/ML SOAJ Inject 75 mg into the skin every 14 (fourteen) days.     allopurinol (ZYLOPRIM) 100 MG tablet Take 50 mg by mouth 2 (two) times a week.     amiodarone (PACERONE) 200 MG tablet Take 1 tablet (200 mg  total) by mouth in the morning.     ANDROGEL PUMP 20.25 MG/ACT (1.62%) GEL SMARTSIG:2 Pump Topical Daily     apixaban (ELIQUIS) 5 MG TABS tablet Take 1 tablet (5 mg total) by mouth 2 (two) times daily. 180 tablet 3   ARTIFICIAL SALIVA MT Swish and swallow 1-2 tsp Mouth/Throat once a day if needed  betamethasone valerate lotion (VALISONE) 0.1 % 1 application Externally once a day if needed     busPIRone (BUSPAR) 10 MG tablet Take 5 mg by mouth 2 (two) times daily.     Carboxymethylcellulose Sodium 1 % GEL 1 drop Ophthalmic once a day if needed     cetirizine (ZYRTEC) 10 MG tablet Take 10 mg by mouth in the morning.     chlorpheniramine-HYDROcodone (TUSSIONEX) 10-8 MG/5ML Take 5 mLs by mouth as needed.     colchicine 0.6 MG tablet Take 0.6-1.2 mg by mouth 2 (two) times daily as needed (gout).      Continuous Blood Gluc Sensor (DEXCOM G7 SENSOR) MISC 3 each by Does not apply route every 30 (thirty) days. Apply 1 sensor every 10 days 9 each 4   Continuous Glucose Sensor (FREESTYLE LIBRE 3 SENSOR) MISC 1 each by Does not apply route every 14 (fourteen) days. 6 each 3   Cyanocobalamin (VITAMIN B-12) 5000 MCG TBDP Take 5,000 mcg by mouth 2 (two) times a week.     cycloSPORINE (RESTASIS) 0.05 % ophthalmic emulsion Place 1 drop into both eyes 2 (two) times daily as needed (eye irritation.).     diclofenac Sodium (VOLTAREN) 1 % GEL Apply 2 g topically 3 (three) times daily as needed for pain.     ezetimibe (ZETIA) 10 MG tablet Take 1 tablet (10 mg total) by mouth daily. 90 tablet 3   fluticasone (FLONASE) 50 MCG/ACT nasal spray Place 1 spray into both nostrils daily as needed for allergies or rhinitis.     furosemide (LASIX) 40 MG tablet Take 80 mg by mouth daily. Take 1 tablet (40 mg) by mouth every other day alternating with 80 mg dosage     GLIPIZIDE PO Take 5 mg by mouth as needed.     HYDROcodone bit-homatropine (HYCODAN) 5-1.5 MG/5ML syrup Take 5 mLs by mouth as needed.      HYDROcodone-acetaminophen (NORCO) 10-325 MG tablet TAKE 1 TABLET BY MOUTH THREE TIMES A DAY AS NEEDED FOR PAIN     Meclizine HCl 25 MG CHEW CHEW ONE-HALF TABLET BY MOUTH TWICE A DAY FOR VERTIGO **DO NOT TAKE WITH CETRIZINE     methocarbamol (ROBAXIN) 500 MG tablet Take 500 mg by mouth 3 (three) times daily as needed for muscle spasms.     Multiple Vitamin (MULTIVITAMIN) capsule Take 1 capsule by mouth in the morning.     mupirocin ointment (BACTROBAN) 2 % Apply 1 Application topically daily as needed (irritation).     nitroGLYCERIN (NITROSTAT) 0.4 MG SL tablet 1 TAB UNDER THE TONGUE EVERY 5 MINUTES AS NEEDED FOR CHEST PAIN UP TO 3 DOSES, IF PERSIST CALL 911 25 tablet 3   ONETOUCH VERIO test strip 1 each daily.     oxybutynin (DITROPAN) 5 MG tablet Take 1 tablet (5 mg total) by mouth every 8 (eight) hours as needed for bladder spasms. 30 tablet 1   pantoprazole (PROTONIX) 40 MG tablet Take 40 mg by mouth daily as needed (heartburn).     potassium chloride SA (KLOR-CON M) 20 MEQ tablet Take 20 mEq by mouth daily.     Semaglutide,0.25 or 0.5MG /DOS, 2 MG/3ML SOPN Inject 0.5 mg into the skin once a week. 9 mL 3   tadalafil (CIALIS) 20 MG tablet 1 tablet Orally once a day if needed     traZODone (DESYREL) 100 MG tablet Take 100 mg by mouth at bedtime.     No current facility-administered medications for this encounter.  Allergies  Allergen Reactions   Crestor [Rosuvastatin] Other (See Comments)    Muscle aches    Lipitor [Atorvastatin] Other (See Comments)    Muscle aches   Pravastatin Other (See Comments)    Muscle aches   Carvedilol Other (See Comments)    loss of appetite   Metformin Diarrhea   Serotonin Reuptake Inhibitors (Ssris) Other (See Comments)    Muscle aches   Zocor [Simvastatin] Other (See Comments)     Muscle pain   Codeine Itching and Other (See Comments)    Extremity tingling-- can take synthetic    Social History   Socioeconomic History   Marital status: Married     Spouse name: Darl Pikes   Number of children: 1   Years of education: Not on file   Highest education level: Master's degree (e.g., MA, MS, MEng, MEd, MSW, MBA)  Occupational History    Comment: Radio broadcast assistant retired  Tobacco Use   Smoking status: Former    Types: Cigarettes   Smokeless tobacco: Never   Tobacco comments:    Former smoke 04/08/22  Vaping Use   Vaping Use: Never used  Substance and Sexual Activity   Alcohol use: Not Currently    Alcohol/week: 2.0 standard drinks of alcohol    Types: 1 Glasses of wine, 1 Shots of liquor per week    Comment: OCCASIONAL   Drug use: Never   Sexual activity: Not Currently  Other Topics Concern   Not on file  Social History Narrative   Lives with wife   occas soda   Social Determinants of Health   Financial Resource Strain: Not on file  Food Insecurity: No Food Insecurity (03/27/2021)   Hunger Vital Sign    Worried About Running Out of Food in the Last Year: Never true    Ran Out of Food in the Last Year: Never true  Transportation Needs: No Transportation Needs (04/15/2022)   PRAPARE - Administrator, Civil Service (Medical): No    Lack of Transportation (Non-Medical): No  Physical Activity: Not on file  Stress: Not on file  Social Connections: Not on file  Intimate Partner Violence: Not on file    Family History  Problem Relation Age of Onset   Other Mother        joint problems   Alzheimer's disease Mother    Hypertension Father    Heart disease Father    CVA Father        age 58   Depression Father    Heart disease Sister        CABG   Stroke Sister    Heart failure Sister        congestive   Diabetes Sister    Heart disease Brother    Hypertension Brother    Congestive Heart Failure Brother     ROS- All systems are reviewed and negative except as per the HPI above  Physical Exam: Vitals:   01/14/23 1331  Weight: 90.2 kg    Wt Readings from Last 3 Encounters:  01/14/23 90.2 kg   01/11/23 90.7 kg  12/22/22 90.7 kg    Labs: Lab Results  Component Value Date   NA 137 12/22/2022   K 3.3 (L) 12/22/2022   CL 99 12/22/2022   CO2 26 12/22/2022   GLUCOSE 228 (H) 12/22/2022   BUN 42 (H) 12/22/2022   CREATININE 4.22 (H) 12/22/2022   CALCIUM 9.7 12/22/2022   PHOS 3.7 01/10/2021   MG 2.0 01/10/2021  Lab Results  Component Value Date   INR 1.6 (H) 02/01/2021   Lab Results  Component Value Date   CHOL 94 04/21/2022   HDL 50 04/21/2022   LDLCALC 30 04/21/2022   TRIG 69 04/21/2022    GEN- The patient is a well appearing elderly male, alert and oriented x 3 today.   HEENT-head normocephalic, atraumatic, sclera clear, conjunctiva pink, hearing intact, trachea midline. Lungs- Clear to ausculation bilaterally, normal work of breathing Heart- Regular rate and rhythm, no murmurs, rubs or gallops  GI- soft, NT, ND, + BS Extremities- no clubbing, cyanosis, or edema MS- no significant deformity or atrophy Skin- no rash or lesion Psych- euthymic mood, full affect Neuro- strength and sensation are intact   EKG today demonstrates SR, 1st degree AV block, RBBB, LAFB Vent. rate 62 BPM PR interval 212 ms QRS duration 168 ms QT/QTcB 488/495 ms   Echo 04/11/21  1. Left ventricular ejection fraction, by estimation, is 55 to 60%. The  left ventricle has normal function. The left ventricle has no regional  wall motion abnormalities. There is moderate asymmetric left ventricular hypertrophy of the basal-septal segment. Left ventricular diastolic parameters are indeterminate.   2. Right ventricular systolic function is normal. The right ventricular  size is normal. There is normal pulmonary artery systolic pressure.   3. Left atrial size was severely dilated.   4. Mean mitral valve gradient is 8 mmHg compared with 12 mmHg 02/2021. There is a filamentous, mobile density on the left atrial side of the mitral annulus. Previously noted on TEE 02/2021. No significant change.  Likely an annuloplasty suture. The mitral valve has been repaired/replaced. Mild mitral valve regurgitation. Mild to moderate mitral stenosis. There is a 26 mm prosthetic annuloplasty ring  present in the mitral position. Procedure Date: 03/25/2016.   5. The aortic valve is tricuspid. There is mild calcification of the  aortic valve. There is mild thickening of the aortic valve. Aortic valve  regurgitation is not visualized. No aortic stenosis is present.   6. Aortic dilatation noted. There is mild dilatation of the ascending  aorta, measuring 39 mm.   7. The inferior vena cava is normal in size with greater than 50%  respiratory variability, suggesting right atrial pressure of 3 mmHg.   8. Evidence of atrial level shunting detected by color flow Doppler.  There is a large patent foramen ovale with predominantly left to right  shunting across the atrial septum.    CHA2DS2-VASc Score = 8  The patient's score is based upon: CHF History: 1 HTN History: 1 Diabetes History: 1 Stroke History: 2 Vascular Disease History: 1 Age Score: 2 Gender Score: 0       ASSESSMENT AND PLAN: 1. Persistent Atrial Fibrillation/atrial flutter/atrial tachycardia The patient's CHA2DS2-VASc score is 8, indicating a 10.8% annual risk of stroke.   S/p MAZE 2017 Patient in SR today. Will have him wear a 2 week Zio monitor to evaluate arrhythmia burden.  Per previous note from Dr Johney Frame, "He has known arrhythmias chronically in the setting of valvular heart disease and severe LA enlargement. He is already on amiodarone.  He does not have other AAD options." Not an ideal ablation candidate with severe LA enlargement and other comorbidities. If he has a high burden of afib on his monitor, he will need visit with EP to discuss options.  Continue amiodarone 200 mg daily Continue Eliquis 5 mg BID  2. Secondary Hypercoagulable State (ICD10:  D68.69) The patient is at significant risk  for stroke/thromboembolism based  upon his CHA2DS2-VASc Score of 8.  Continue Apixaban (Eliquis).   3. Chronic HFpEF Followed in [facility] Fluid status appears stable today.  4. CAD  S/p CABG On Praluent  No anginal symptoms.  5. MR S/p mitral valve repair 2017  6. OSA Encouraged compliance with CPAP therapy.  7. HTN Stable, no changes today.   Follow up with Dr Shirlee Latch as scheduled. AF clinic follow up pending monitor results.    Jorja Loa PA-C Afib Clinic [facility] [address] [place], [place] [address] [phone]

## 2023-01-20 ENCOUNTER — Telehealth (HOSPITAL_COMMUNITY): Payer: Self-pay

## 2023-01-20 NOTE — Telephone Encounter (Signed)
Patient's wife called and stated he is very weak and having a lot of muscle pain. Could it be related to Zetia? She wanted to see what suggestions we had.

## 2023-01-20 NOTE — Telephone Encounter (Signed)
I spoke to patient's wife and informed her of information.

## 2023-02-08 DIAGNOSIS — R002 Palpitations: Secondary | ICD-10-CM | POA: Diagnosis not present

## 2023-02-09 NOTE — Addendum Note (Signed)
Encounter addended by: Shona Simpson, RN on: 02/09/2023 8:38 AM  Actions taken: Imaging Exam ended

## 2023-02-12 ENCOUNTER — Other Ambulatory Visit (HOSPITAL_COMMUNITY): Payer: Self-pay

## 2023-02-12 ENCOUNTER — Encounter (HOSPITAL_COMMUNITY): Payer: Self-pay

## 2023-02-12 DIAGNOSIS — I4891 Unspecified atrial fibrillation: Secondary | ICD-10-CM

## 2023-02-16 ENCOUNTER — Other Ambulatory Visit: Payer: Self-pay

## 2023-02-16 ENCOUNTER — Encounter (HOSPITAL_COMMUNITY): Payer: Self-pay | Admitting: Cardiology

## 2023-02-16 ENCOUNTER — Other Ambulatory Visit (HOSPITAL_COMMUNITY): Payer: Self-pay

## 2023-02-16 ENCOUNTER — Ambulatory Visit (HOSPITAL_COMMUNITY)
Admission: RE | Admit: 2023-02-16 | Discharge: 2023-02-16 | Disposition: A | Discharge status: Home / Self Care | Payer: No Typology Code available for payment source | Source: Ambulatory Visit | Attending: Cardiology | Admitting: Cardiology

## 2023-02-16 VITALS — BP 112/60 | HR 64 | Wt 201.0 lb

## 2023-02-16 DIAGNOSIS — Z7901 Long term (current) use of anticoagulants: Secondary | ICD-10-CM | POA: Insufficient documentation

## 2023-02-16 DIAGNOSIS — I4719 Other supraventricular tachycardia: Secondary | ICD-10-CM | POA: Insufficient documentation

## 2023-02-16 DIAGNOSIS — Z79899 Other long term (current) drug therapy: Secondary | ICD-10-CM | POA: Insufficient documentation

## 2023-02-16 DIAGNOSIS — R0602 Shortness of breath: Secondary | ICD-10-CM | POA: Insufficient documentation

## 2023-02-16 DIAGNOSIS — E1122 Type 2 diabetes mellitus with diabetic chronic kidney disease: Secondary | ICD-10-CM | POA: Insufficient documentation

## 2023-02-16 DIAGNOSIS — Z823 Family history of stroke: Secondary | ICD-10-CM | POA: Insufficient documentation

## 2023-02-16 DIAGNOSIS — Z951 Presence of aortocoronary bypass graft: Secondary | ICD-10-CM | POA: Diagnosis not present

## 2023-02-16 DIAGNOSIS — E785 Hyperlipidemia, unspecified: Secondary | ICD-10-CM | POA: Insufficient documentation

## 2023-02-16 DIAGNOSIS — I5032 Chronic diastolic (congestive) heart failure: Secondary | ICD-10-CM | POA: Diagnosis not present

## 2023-02-16 DIAGNOSIS — N184 Chronic kidney disease, stage 4 (severe): Secondary | ICD-10-CM | POA: Insufficient documentation

## 2023-02-16 DIAGNOSIS — I4819 Other persistent atrial fibrillation: Secondary | ICD-10-CM

## 2023-02-16 DIAGNOSIS — I2510 Atherosclerotic heart disease of native coronary artery without angina pectoris: Secondary | ICD-10-CM | POA: Insufficient documentation

## 2023-02-16 DIAGNOSIS — I130 Hypertensive heart and chronic kidney disease with heart failure and stage 1 through stage 4 chronic kidney disease, or unspecified chronic kidney disease: Secondary | ICD-10-CM | POA: Diagnosis not present

## 2023-02-16 DIAGNOSIS — G4733 Obstructive sleep apnea (adult) (pediatric): Secondary | ICD-10-CM | POA: Insufficient documentation

## 2023-02-16 DIAGNOSIS — I4891 Unspecified atrial fibrillation: Secondary | ICD-10-CM | POA: Diagnosis not present

## 2023-02-16 DIAGNOSIS — Z8673 Personal history of transient ischemic attack (TIA), and cerebral infarction without residual deficits: Secondary | ICD-10-CM | POA: Insufficient documentation

## 2023-02-16 DIAGNOSIS — Z8249 Family history of ischemic heart disease and other diseases of the circulatory system: Secondary | ICD-10-CM | POA: Diagnosis not present

## 2023-02-16 DIAGNOSIS — R5383 Other fatigue: Secondary | ICD-10-CM | POA: Diagnosis not present

## 2023-02-16 DIAGNOSIS — I509 Heart failure, unspecified: Secondary | ICD-10-CM | POA: Diagnosis not present

## 2023-02-16 LAB — COMPREHENSIVE METABOLIC PANEL
ALT: 19 U/L (ref 0–44)
AST: 17 U/L (ref 15–41)
Albumin: 3.8 g/dL (ref 3.5–5.0)
Alkaline Phosphatase: 93 U/L (ref 38–126)
Anion gap: 11 (ref 5–15)
BUN: 33 mg/dL — ABNORMAL HIGH (ref 8–23)
CO2: 25 mmol/L (ref 22–32)
Calcium: 9.3 mg/dL (ref 8.9–10.3)
Chloride: 103 mmol/L (ref 98–111)
Creatinine, Ser: 4.32 mg/dL — ABNORMAL HIGH (ref 0.61–1.24)
GFR, Estimated: 13 mL/min — ABNORMAL LOW (ref >60)
Glucose, Bld: 217 mg/dL — ABNORMAL HIGH (ref 70–99)
Potassium: 4 mmol/L (ref 3.5–5.1)
Sodium: 139 mmol/L (ref 135–145)
Total Bilirubin: 0.6 mg/dL (ref 0.3–1.2)
Total Protein: 6 g/dL — ABNORMAL LOW (ref 6.5–8.1)

## 2023-02-16 LAB — LIPID PANEL
Cholesterol: 90 mg/dL (ref 0–200)
HDL: 46 mg/dL (ref >40)
LDL Cholesterol: 23 mg/dL (ref 0–99)
Total CHOL/HDL Ratio: 2 RATIO
Triglycerides: 106 mg/dL (ref <150)
VLDL: 21 mg/dL (ref 0–40)

## 2023-02-16 LAB — TSH: TSH: 2.211 u[IU]/mL (ref 0.350–4.500)

## 2023-02-16 MED ORDER — FUROSEMIDE 40 MG PO TABS: ORAL_TABLET | ORAL | 3 refills | Status: DC

## 2023-02-16 NOTE — Patient Instructions (Signed)
CHANGE Lasix to 80 mg in the morning and 40 mg in the evening.  Labs done today, your results will be available in MyChart, we will contact you for abnormal readings.  You are scheduled for a Cardioversion on Monday, July 1 with Dr. Shirlee Latch.  Please arrive at the [facility] (Main Entrance A) at [facility]: [address] [place], [place] [address] at 7:30 AM (This time is 1.5 hour(s) before your procedure to ensure your preparation). Free valet parking service is available. You will check in at ADMITTING. The support person will be asked to wait in the waiting room.  It is OK to have someone drop you off and come back when you are ready to be discharged.     DIET:  Nothing to eat or drink after midnight except a sip of water with medications (see medication instructions below)  MEDICATION INSTRUCTIONS:  Semaglutide (Ozempic, Rybelsus, Wegovy) for 7 days prior to the procedure.        Continue taking your anticoagulant (blood thinner): Apixaban (Eliquis).  You will need to continue this after your procedure until you are told by your provider that it is safe to stop.    HOLD LASIX AND GLIPIZIDE THE MORNING OF PROCEDURE.  FYI:  For your safety, and to allow [place] to monitor your vital signs accurately during the surgery/procedure we request: If you have artificial nails, gel coating, SNS etc, please have those removed prior to your surgery/procedure. Not having the nail coverings /polish removed may result in cancellation or delay of your surgery/procedure.  You must have a responsible person to drive you home and stay in the waiting area during your procedure. Failure to do so could result in cancellation.  Bring your insurance cards.  *Special Note: Every effort is made to have your procedure done on time. Occasionally there are emergencies that occur at the hospital that may cause delays. Please be patient if a delay does occur.   Your physician has requested that you have an  echocardiogram. Echocardiography is a painless test that uses sound waves to create images of your heart. It provides your doctor with information about the size and shape of your heart and how well your heart's chambers and valves are working. This procedure takes approximately one hour. There are no restrictions for this procedure. Please do NOT wear cologne, perfume, aftershave, or lotions (deodorant is allowed). Please arrive 15 minutes prior to your appointment time.  Your physician recommends that you schedule a follow-up appointment in: 3 WEEKS.  If you have any questions or concerns before your next appointment please send [place] a message through [facility] or call our office at [phone].    TO LEAVE A MESSAGE FOR THE NURSE SELECT OPTION 2, PLEASE LEAVE A MESSAGE INCLUDING: YOUR NAME DATE OF BIRTH CALL BACK NUMBER REASON FOR CALL**this is important as we prioritize the call backs  YOU WILL RECEIVE A CALL BACK THE SAME DAY AS LONG AS YOU CALL BEFORE 4:00 PM  At the [facility], you and your health needs are our priority. As part of our continuing mission to provide you with exceptional heart care, we have created designated Provider Care Teams. These Care Teams include your primary Cardiologist (physician) and Advanced Practice Providers (APPs- Physician Assistants and Nurse Practitioners) who all work together to provide you with the care you need, when you need it.   You may see any of the following providers on your designated Care Team at your  next follow up: Dr Arvilla Meres Dr Marca Ancona Dr. Marcos Eke, NP Robbie Lis, [place] [facility] [place], [place] Brynda Peon, NP Karle Plumber, PharmD   Please be sure to bring in all your medications bottles to every appointment.    Thank you for choosing [facility]

## 2023-02-17 NOTE — H&P (View-Only) (Signed)
Patient ID: [name], male   DOB: Jan 15, 1946, 77 y.o.   MRN: [id number] PCP: Dr Tenny Craw Cardiology: Dr. Shirlee Latch  77 y.o. with history of CAD s/p CABG and obesity s/p lap banding procedure presents for cardiology followup.  Given exertional dyspnea and chest tightness, he had LHC in 1/15.  This showed no change from prior.  Grafts were patent and there was severe stenosis in distal D1, not amenable to PCI.   In 2017, he developed increased exertional dyspnea.  Eventually had a TEE showing severe MR with prolapse of a portion of the anterior mitral valve leaflet.  Coronary angiography in 6/17 showed patent grafts and 90% stenosis of distal diagonal not amenable to intervention.  In 8/17, he had mitral valve repair.  Post-op diastolic CHF, Lasix started and increased.   He had a cath again in 9/18 showing stable coronary anatomy.    He developed atrial arrhythmias in 12/18.  Both fibrillation and flutter were seen.  Saw Dr. Johney Frame, recommended DCCV with amiodarone or sotalol if fibrillation recurs.  He had DCCV in 1/19.   In 6/19, he was noted to have developed AKI. Creatinine went up to 3.46.  Renal US was done, showing normal kidneys. Cause of AKI was not clear.  We stopped his Lasix. He saw nephrology and eventually went back on Lasix, currently taking 80 mg daily.  He is not using any NSAIDs.    He stopped pravastatin due to myalgias. We struggled to get him on a PCSK9 inhibitor through the [place] but he was finally able to get Praluent.    I had him do a Lexiscan Cardiolite in 11/19, which showed old inferior MI with no ischemia.  Echo in 11/19 showed EF 55-60%, s/p MV repair with mean gradient 6 mmHg. Echo 12/20 showed EF 50-55%, mildly decreased RV systolic function, repaired mitral valve with mild MR, mean gradient 6 mmHg across MV.    He had DCCV from atrial fibrillation to NSR in 1/21 and again in 2/21.   At last appointment, he reported significant fatigue and dyspnea in setting of weight  loss with low BP and HR.  I stopped his Coreg. He felt better with that change.    In 9/21, he had COVID-19 infection but was not hospitalized.   Zio patch in 3/22 showed frequent short atrial tachycardia episodes.  He saw Dr. Johney Frame, medical therapy recommended (on amiodarone).   He was admitted in 5/22 with AKI from obstruction of right ureter by stone, he was treated by urology.   He was admitted in 6/22 with right-sided weakness and was found to have left cerebral white matter CVA.  He had been compliant with Eliquis, ASA 81 was added.   He was admitted in 7/22 with Enterococcal bacteremia.  TEE showed oscillating density attached to the mitral annulus, ?suture.  However, could not rule out vegetation so had 6 wks of IV abx for SBE.   Echo in 8/22 showed EF 55-60%, moderate focal basal septal hypertrophy, normal RV, severe LAE, s/p mitral valve repair with mean gradient 8 mmHg (mild to moderate mitral stenosis) and mild MR, large PFO, normal IVC.   Patient was noted to re-develop atrial fibrillation.  TEE was done in 8/23, LV EF was 55% with mild RV enlargement and normal RV function, s/p MV repair with mean gradient 10 mmHg but MVA 2.19 cm^2 (leaflets open well), trivial MR.  He was cardioverted back to NSR.   Today he returns for HF follow up.  Patient is in atrial fibrillation with controlled rate today, he appears to have been in atrial fibrillation for about a month.  Zio monitor in 6/24 showed continuous AF.  He is using his CPAP.  He has felt worse over the last 1-2 months.  He is short of breath walking around his house.  Weight is up 4 lbs. No orthopnea/PND.  No lightheadedness.  Significant fatigue.   ECG (personally reviewed): Atrila fibrillation rate 56, RBBB, LAFB  Labs (11/12): LDL 70 Labs (5/13): K 4.1, creatinine 1.1, BNP 48 Labs (12/13): K 3.4, creatinine 1.07, LDL 59, HDL 37 Labs (1/15): K 4.3, creatinine 1.2, HCT 40.3 Labs (3/15): LDL 38, HDL 81, BNP 55, K 4, creatinine  1.2 Labs (6/15): K 4.2, creatinine 1.4, LFTs normal, LDL 66, HDL 35 Labs (8/16): K 4, creatinine 1.6, BNP 64 Labs (12/16): LDL 61, HDL 37 Labs (4/17): K 4.2, creatinine 1.35, HCT 38.7, BNP 80 Labs (8/17): K 4.2, creatinine 1.4 => 1.66, BNP 159 Labs (12/17): K 3.6, creatinine 1.75 Labs (2/18): LDL 76, HDL 36 Labs (9/18): K 3.9, creatinine 1.76 Labs (1/19): K 4.9, creatinine 1.76, hgb 14 Labs (6/19): K 3.6, creatinine 3.46 Labs (7/19): K 4.1, creatinine 3.66, hgb 12.7 Labs (10/19): LDL 177, hgb 13.4, troponin negative, K 4, creatinine 4.15 Labs (12/19): K 4.3, creatinine 3.88 Labs (4/20): K 3.6, creatinine 3.39 Labs (11/20): LDL 73, HDL 38 Labs (2/21): K 3.4, creatinine 3.8 Labs (7/21): K 3.7, creatinine 3.66 Labs (6/22): K 4, creatinine 2.88, LDL 50 Labs (8/22): K 3.6, creatinine 2.79 Labs (1/23): K 4.3, creatinine 3.21 Labs (4/23): LDL 100 Labs (8/23): K 3.5, creatinine 4.56, hgb 10.3 Labs (4/24): K 3.3, creatinine 4.22, LFTs normal, TSH normal  PMH: 1. OSA: on CPAP.  2. Diabetes mellitus 3. Allergic rhinitis 4. CAD: s/p CABG 1995.  LHC (10/11) with 90% distal D1 (patent proximal D1 stent), total occlusion CFX, total occlusion RCA, no significant stenoses in LAD, SVG-PDA patent, SVG-OM patent, EF 55%.  Medical management.  LHC (5/13) with patent grafts and 90% distal D1 stenosis (no significant change from 10/11).  LHC (1/15) with patent grafts, 75% ISR in D1 stent, 90% stenosis distal D1 (small caliber). D1 not amenable to intervention.  - Coronary angiography (6/17): grafts patent, 90% stenosis distally in large diagonal not amenable to intervention.   - LHC (9/18): SVG-OM and SVG-PDA patent,  75% in-stent restenosis in proximal diagonal then 90% stenosis distally (small in caliber at that point), totally occluded LCx and RCA.  Left coronary system similar to prior caths, D1 may be source of angina.  - Lexiscan Cardiolite (11/19): EF 47%, old inferior MI, no ischemia.  5. Obesity:  lap-banding in 5/11.  6. Chronic diastolic CHF: Echo (8/17) with EF 55-60%, moderate LVH, moderate diastolic dysfunction, s/p mitral valve repair with elevated mean gradient but normal pressure half-time.  - Echo (8/18): EF 55-60%, s/p MV repair with mean gradient 6 mmHg, PASP 28 mmHg, mildly decreased RV systolic function.  - Echo (11/19): EF 55-60%, s/p MV repair with mean gradient 6, PASP 42 mmHg, mildly dilated RV, dilated IVC.  - Echo (12/20): EF 50-55%, mildly decreased RV systolic function. S/p MV repair with mean gradient 6 mmHg, mild MR.  - Echo (2/22): EF 45-50%, mild LVH, moderate RV enlargement with low normal systolic function, s/p mitral valve repair with mean gradient 5 mmHg and trivial MR, mild-moderate AS.  - Echo (8/22): EF 55-60%, moderate focal basal septal hypertrophy, normal RV, severe LAE, s/p mitral  valve repair with mean gradient 8 mmHg (mild to moderate mitral stenosis) and mild MR, large PFO, normal IVC. - TEE (8/23): LV EF was 55% with mild RV enlargement and normal RV function, s/p MV repair with mean gradient 10 mmHg but MVA 2.19 cm^2 (leaflets open well), trivial MR. 7. PFO: noted on 8/22 echo.  8. OA: Knees, C-spine. TKR 9/13.  9. HTN 10. Hyperlipidemia: Myalgias with atorvastatin and Crestor. Now on Praluent.  11. Diverticulosis 12. Mitral regurgitation: TEE (6/17) with EF 55-60%, severe eccentric MR, prolapse of a portion of the anterior leaflet, peak RV-RA gradient 45 mmHg.  - Mitral valve repair 8/17 - Echo in 8/22 with repaired MV, mean gradient 8 mmHg (mild-moderate MS), mild MR.  - TEE in 8/23 with MV repair with mean gradient 10 mmHg but MVA 2.19 cm^2 (leaflets open well), trivial MR. 13. CKD: Stage III.  14. Atrial fibrillation/atrial flutter: Both arrhythmias noted in 12/18.  DCCV 12/19.   - DCCV to NSR 1/21.  - DCCV to NSR 2/21.  - DCCV to NSR in 8/23 15. AKI 6/19 => CKD stage 4: uncertain etiology.  Renal US was normal.  16. Gout 17. COVID-19  infection 9/21 18. 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  - Carotid dopplers (6/22) with mild disease only.  19. Nephrolithiasis.  20. Atrial tachycardia: Noted on 3/22 Zio patch.  21. Aortic stenosis: mild-moderate on 2/22 echo but no significant stenosis on 8/22 echo.  - No AS on TEE in 8/23.  22. 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  TEE 7/22 with oscillating density attached to the mitral annulus, ?suture.  However, could not rule out vegetation so had 6 wks of IV abx for SBE.  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  Lives in [place].   Family History  Problem Relation Age of Onset   Other Mother        joint problems   Alzheimer's disease Mother    Hypertension Father    Heart disease Father    CVA Father        age 75   Depression Father    Heart disease Sister        CABG   Stroke Sister    Heart failure Sister        congestive   Diabetes Sister    Heart disease Brother    Hypertension Brother    Congestive Heart Failure Brother     ROS: All systems reviewed and negative except as per HPI.    Current Outpatient Medications  Medication Sig Dispense Refill   albuterol (VENTOLIN HFA) 108 (90 Base) MCG/ACT inhaler Inhale 1-2 puffs into the lungs every 6 (six) hours as needed for shortness of breath or wheezing.     Alirocumab (PRALUENT) 75 MG/ML SOAJ Inject 75 mg into the skin every 14 (fourteen) days.     allopurinol (ZYLOPRIM) 100 MG tablet Take 50 mg by mouth 2 (two) times a week.     amiodarone (PACERONE) 200 MG tablet Take 1 tablet (200 mg total) by mouth in the morning.     ANDROGEL PUMP 20.25 MG/ACT (1.62%) GEL Apply 2 Pump topically daily.     apixaban (ELIQUIS) 5 MG TABS tablet Take 1 tablet (5 mg total) by mouth 2 (two) times daily. 180 tablet 3   ARTIFICIAL SALIVA MT Swish and swallow 1-2 tsp Mouth/Throat once a day if needed     busPIRone (BUSPAR) 10 MG tablet Take 5 mg by  mouth 2 (two) times  daily.     Carboxymethylcellulose Sodium 1 % GEL Place 1 drop into both eyes daily as needed (dry eyes).     cetirizine (ZYRTEC) 10 MG tablet Take 10 mg by mouth in the morning.     colchicine 0.6 MG tablet Take 0.6-1.2 mg by mouth 2 (two) times daily as needed (gout).      Cyanocobalamin (VITAMIN B-12) 5000 MCG TBDP Take 5,000 mcg by mouth 2 (two) times a week.     cycloSPORINE (RESTASIS) 0.05 % ophthalmic emulsion Place 1 drop into both eyes 2 (two) times daily as needed (eye irritation.).     diclofenac Sodium (VOLTAREN) 1 % GEL Apply 2 g topically 3 (three) times daily as needed for pain.     ezetimibe (ZETIA) 10 MG tablet Take 1 tablet (10 mg total) by mouth daily. 90 tablet 3   fluticasone (FLONASE) 50 MCG/ACT nasal spray Place 1 spray into both nostrils daily as needed for allergies or rhinitis.     HYDROcodone-acetaminophen (NORCO) 10-325 MG tablet Take 1 tablet by mouth 3 (three) times daily as needed for moderate pain.     Meclizine HCl 25 MG CHEW CHEW ONE-HALF TABLET BY MOUTH TWICE A DAY FOR VERTIGO **DO NOT TAKE WITH CETRIZINE     methocarbamol (ROBAXIN) 500 MG tablet Take 500 mg by mouth 3 (three) times daily as needed for muscle spasms.     Multiple Vitamin (MULTIVITAMIN) capsule Take 1 capsule by mouth in the morning.     nitroGLYCERIN (NITROSTAT) 0.4 MG SL tablet 1 TAB UNDER THE TONGUE EVERY 5 MINUTES AS NEEDED FOR CHEST PAIN UP TO 3 DOSES, IF PERSIST CALL 911 25 tablet 3   ONETOUCH VERIO test strip 1 each daily.     pantoprazole (PROTONIX) 40 MG tablet Take 40 mg by mouth daily as needed (heartburn).     potassium chloride SA (KLOR-CON M) 20 MEQ tablet Take 20 mEq by mouth daily.     Semaglutide,0.25 or 0.5MG /DOS, 2 MG/3ML SOPN Inject 0.5 mg into the skin once a week. 9 mL 3   tadalafil (CIALIS) 20 MG tablet Take 20 mg by mouth daily as needed.     traZODone (DESYREL) 100 MG tablet Take 100 mg by mouth at bedtime.     docusate sodium (COLACE) 100 MG capsule Take 200 mg by mouth  at bedtime.     ferrous sulfate 325 (65 FE) MG tablet Take 325 mg by mouth every other day.     furosemide (LASIX) 40 MG tablet Take 2 tablets (80 mg total) by mouth every morning AND 1 tablet (40 mg total) every evening. 254 tablet 3   No current facility-administered medications for this encounter.    BP 112/60   Pulse 64   Wt 91.2 kg (201 lb)   SpO2 96%   BMI 29.68 kg/m    Wt Readings from Last 3 Encounters:  02/16/23 91.2 kg (201 lb)  01/14/23 90.2 kg (198 lb 12.8 oz)  01/11/23 90.7 kg (200 lb)   General: NAD Neck: JVP 8-9 cm, no thyromegaly or thyroid nodule.  Lungs: Clear to auscultation bilaterally with normal respiratory effort. CV: Nondisplaced PMI.  Heart irregular S1/S2, no S3/S4, no murmur.  No peripheral edema.  No carotid bruit.  Normal pedal pulses.  Abdomen: Soft, nontender, no hepatosplenomegaly, mild distention.  Skin: Intact without lesions or rashes.  Neurologic: Alert and oriented x 3.  Psych: Normal affect. Extremities: No clubbing or cyanosis.  HEENT: Normal.  Assessment/Plan: 1. S/p mitral valve repair:  Echo in 8/22 showed mild-moderate stenosis with mean gradient 8 mmHg and mild MR.  TEE in 8/23 showed MV repair with mean gradient 10 mmHg but MVA 2.19 cm^2 (leaflets open well), trivial MR.  There is likely a degree of mitral stenosis from a relatively tight repair.  I do not think this is clinically significant.  - With prosthetic material, should use antibiotic prophylaxis with dental work.  - I will arrange for repeat echo to reassess mitral valve.  2. CAD:  He has severe stenosis in a distal D1 that is not amenable to intervention.  This has been stable on last couple of caths. He has occluded RCA and LCx known from the past with SVGs to OM and PDA.  Echo in 2/22 with EF 45-50%. Cardiolite in 11/19 showed no ischemia. Echo in 8/22 with EF up to 55-60%, TEE in 8/23 with EF 55%.  No recent chest pain.   - Currently not on ASA, taking apixaban.  - He  cannot tolerate statins with myalgias, Now on Praluent.    3. Hyperlipidemia: Myalgias with Crestor, pravastatin, and atorvastatin.  We were finally able to get him Praluent through the [place]. He is also on Zetia.  - Check lipids today.    4. Chronic primarily diastolic CHF:  TEE in 8/23 with EF 55%, mild RV enlargement with normal RV function.  CHF is worsened by CKD stage IV. NYHA class III symptoms, worse for about a month.  He is volume overloaded on exam.  Suspect worsening of symptoms has been triggered by atrial fibrillation.   - Increase Lasix to 80 qam/40 qpm.  BMET/BNP today, BMET in 10 days.  5. OSA: Continue CPAP.  6. Atrial fibrillation: Last DCCV in 8/23.  Now appears to have been in atrial fibrillation for at least a month with worsening of CHF.  He is  on amiodarone. He has not missed any apixaban doses in the last month or so.  - Continue apixaban 5 mg bid.  - Continue amiodarone 200 mg daily. Check LFTs, TSH today.  He will need a regular eye exam.  - I will arrange for DCCV, should not need TEE. Discussed risks/benefits and he agrees to procedure.  - He will be seeing Dr. Nelly Laurence, would like consideration for atrial fibrillation ablation.  7. CKD stage 4: AKI without recovery, uncertain etiology.  He sees nephrology, there is concern that he is progressing towards HD. He now has AV fistula. Recent creatinine 4.22.   - BMET today.  8. Diabetes: He is now followed by endocrinology. 9. Atrial tachycardia: Paroxysmal.  Seen by Dr. Johney Frame, medical management planned.  Continue amiodarone.  10. CVA: Though to be atrial fibrillation-related.  Carotid dopplers 6/22 with mild stenosis only. However, he was also noted on 8/22 echo to have a PFO.  - Continue on Eliquis.  11. Enterococcal bacteremia: In 7/22.  On TEE, it was hard to differentiate between loose suture material and small vegetation.  He was treated for 6 wks with IV abx.   Followup in 3 wks with APP.   Marca Ancona 02/17/2023

## 2023-02-17 NOTE — Progress Notes (Signed)
Patient ID: [name], male   DOB: 08/25/1945, 76 y.o.   MRN: [id number] PCP: Dr Ross Cardiology: Dr. Lamesha Tibbits  76 y.o. with history of CAD s/p CABG and obesity s/p lap banding procedure presents for cardiology followup.  Given exertional dyspnea and chest tightness, he had LHC in 1/15.  This showed no change from prior.  Grafts were patent and there was severe stenosis in distal D1, not amenable to PCI.   In 2017, he developed increased exertional dyspnea.  Eventually had a TEE showing severe MR with prolapse of a portion of the anterior mitral valve leaflet.  Coronary angiography in 6/17 showed patent grafts and 90% stenosis of distal diagonal not amenable to intervention.  In 8/17, he had mitral valve repair.  Post-op diastolic CHF, Lasix started and increased.   He had a cath again in 9/18 showing stable coronary anatomy.    He developed atrial arrhythmias in 12/18.  Both fibrillation and flutter were seen.  Saw Dr. Allred, recommended DCCV with amiodarone or sotalol if fibrillation recurs.  He had DCCV in 1/19.   In 6/19, he was noted to have developed AKI. Creatinine went up to 3.46.  Renal US was done, showing normal kidneys. Cause of AKI was not clear.  We stopped his Lasix. He saw nephrology and eventually went back on Lasix, currently taking 80 mg daily.  He is not using any NSAIDs.    He stopped pravastatin due to myalgias. We [facility] but he was finally able to get Praluent.    I had him do a Lexiscan Cardiolite in 11/19, which showed old inferior MI with no ischemia.  Echo in 11/19 showed EF 55-60%, s/p MV repair with mean gradient 6 mmHg. Echo 12/20 showed EF 50-55%, mildly decreased RV systolic function, repaired mitral valve with mild MR, mean gradient 6 mmHg across MV.    He had DCCV from atrial fibrillation to NSR in 1/21 and again in 2/21.   At last appointment, he reported significant fatigue and dyspnea in setting of weight  loss with low BP and HR.  I stopped his Coreg. He felt better with that change.    In 9/21, he had COVID-19 infection but was not hospitalized.   Zio patch in 3/22 showed frequent short atrial tachycardia episodes.  He saw Dr. Allred, medical therapy recommended (on amiodarone).   He was admitted in 5/22 with AKI from obstruction of right ureter by stone, he was treated by urology.   He was admitted in 6/22 with right-sided weakness and was found to have left cerebral white matter CVA.  He had been compliant with Eliquis, ASA 81 was added.   He was admitted in 7/22 with Enterococcal bacteremia.  TEE showed oscillating density attached to the mitral annulus, ?suture.  However, could not rule out vegetation so had 6 wks of IV abx for SBE.   Echo in 8/22 showed EF 55-60%, moderate focal basal septal hypertrophy, normal RV, severe LAE, s/p mitral valve repair with mean gradient 8 mmHg (mild to moderate mitral stenosis) and mild MR, large PFO, normal IVC.   Patient was noted to re-develop atrial fibrillation.  TEE was done in 8/23, LV EF was 55% with mild RV enlargement and normal RV function, s/p MV repair with mean gradient 10 mmHg but MVA 2.19 cm^2 (leaflets open well), trivial MR.  He was cardioverted back to NSR.   Today he returns for HF follow up.    Patient is in atrial fibrillation with controlled rate today, he appears to have been in atrial fibrillation for about a month.  Zio monitor in 6/24 showed continuous AF.  He is using his CPAP.  He has felt worse over the last 1-2 months.  He is short of breath walking around his house.  Weight is up 4 lbs. No orthopnea/PND.  No lightheadedness.  Significant fatigue.   ECG (personally reviewed): Atrila fibrillation rate 56, RBBB, LAFB  Labs (11/12): LDL 70 Labs (5/13): K 4.1, creatinine 1.1, BNP 48 Labs (12/13): K 3.4, creatinine 1.07, LDL 59, HDL 37 Labs (1/15): K 4.3, creatinine 1.2, HCT 40.3 Labs (3/15): LDL 38, HDL 81, BNP 55, K 4, creatinine  1.2 Labs (6/15): K 4.2, creatinine 1.4, LFTs normal, LDL 66, HDL 35 Labs (8/16): K 4, creatinine 1.6, BNP 64 Labs (12/16): LDL 61, HDL 37 Labs (4/17): K 4.2, creatinine 1.35, HCT 38.7, BNP 80 Labs (8/17): K 4.2, creatinine 1.4 => 1.66, BNP 159 Labs (12/17): K 3.6, creatinine 1.75 Labs (2/18): LDL 76, HDL 36 Labs (9/18): K 3.9, creatinine 1.76 Labs (1/19): K 4.9, creatinine 1.76, hgb 14 Labs (6/19): K 3.6, creatinine 3.46 Labs (7/19): K 4.1, creatinine 3.66, hgb 12.7 Labs (10/19): LDL 177, hgb 13.4, troponin negative, K 4, creatinine 4.15 Labs (12/19): K 4.3, creatinine 3.88 Labs (4/20): K 3.6, creatinine 3.39 Labs (11/20): LDL 73, HDL 38 Labs (2/21): K 3.4, creatinine 3.8 Labs (7/21): K 3.7, creatinine 3.66 Labs (6/22): K 4, creatinine 2.88, LDL 50 Labs (8/22): K 3.6, creatinine 2.79 Labs (1/23): K 4.3, creatinine 3.21 Labs (4/23): LDL 100 Labs (8/23): K 3.5, creatinine 4.56, hgb 10.3 Labs (4/24): K 3.3, creatinine 4.22, LFTs normal, TSH normal  PMH: 1. OSA: on CPAP.  2. Diabetes mellitus 3. Allergic rhinitis 4. CAD: s/p CABG 1995.  LHC (10/11) with 90% distal D1 (patent proximal D1 stent), total occlusion CFX, total occlusion RCA, no significant stenoses in LAD, SVG-PDA patent, SVG-OM patent, EF 55%.  Medical management.  LHC (5/13) with patent grafts and 90% distal D1 stenosis (no significant change from 10/11).  LHC (1/15) with patent grafts, 75% ISR in D1 stent, 90% stenosis distal D1 (small caliber). D1 not amenable to intervention.  - Coronary angiography (6/17): grafts patent, 90% stenosis distally in large diagonal not amenable to intervention.   - LHC (9/18): SVG-OM and SVG-PDA patent,  75% in-stent restenosis in proximal diagonal then 90% stenosis distally (small in caliber at that point), totally occluded LCx and RCA.  Left coronary system similar to prior caths, D1 may be source of angina.  - Lexiscan Cardiolite (11/19): EF 47%, old inferior MI, no ischemia.  5. Obesity:  lap-banding in 5/11.  6. Chronic diastolic CHF: Echo (8/17) with EF 55-60%, moderate LVH, moderate diastolic dysfunction, s/p mitral valve repair with elevated mean gradient but normal pressure half-time.  - Echo (8/18): EF 55-60%, s/p MV repair with mean gradient 6 mmHg, PASP 28 mmHg, mildly decreased RV systolic function.  - Echo (11/19): EF 55-60%, s/p MV repair with mean gradient 6, PASP 42 mmHg, mildly dilated RV, dilated IVC.  - Echo (12/20): EF 50-55%, mildly decreased RV systolic function. S/p MV repair with mean gradient 6 mmHg, mild MR.  - Echo (2/22): EF 45-50%, mild LVH, moderate RV enlargement with low normal systolic function, s/p mitral valve repair with mean gradient 5 mmHg and trivial MR, mild-moderate AS.  - Echo (8/22): EF 55-60%, moderate focal basal septal hypertrophy, normal RV, severe LAE, s/p mitral   valve repair with mean gradient 8 mmHg (mild to moderate mitral stenosis) and mild MR, large PFO, normal IVC. - TEE (8/23): LV EF was 55% with mild RV enlargement and normal RV function, s/p MV repair with mean gradient 10 mmHg but MVA 2.19 cm^2 (leaflets open well), trivial MR. 7. PFO: noted on 8/22 echo.  8. OA: Knees, C-spine. TKR 9/13.  9. HTN 10. Hyperlipidemia: Myalgias with atorvastatin and Crestor. Now on Praluent.  11. Diverticulosis 12. Mitral regurgitation: TEE (6/17) with EF 55-60%, severe eccentric MR, prolapse of a portion of the anterior leaflet, peak RV-RA gradient 45 mmHg.  - Mitral valve repair 8/17 - Echo in 8/22 with repaired MV, mean gradient 8 mmHg (mild-moderate MS), mild MR.  - TEE in 8/23 with MV repair with mean gradient 10 mmHg but MVA 2.19 cm^2 (leaflets open well), trivial MR. 13. CKD: Stage III.  14. Atrial fibrillation/atrial flutter: Both arrhythmias noted in 12/18.  DCCV 12/19.   - DCCV to NSR 1/21.  - DCCV to NSR 2/21.  - DCCV to NSR in 8/23 15. AKI 6/19 => CKD stage 4: uncertain etiology.  Renal US was normal.  16. Gout 17. COVID-19  infection 9/21 18. 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  - Carotid dopplers (6/22) with mild disease only.  19. Nephrolithiasis.  20. Atrial tachycardia: Noted on 3/22 Zio patch.  21. Aortic stenosis: mild-moderate on 2/22 echo but no significant stenosis on 8/22 echo.  - No AS on TEE in 8/23.  22. 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  TEE 7/22 with oscillating density attached to the mitral annulus, ?suture.  However, could not rule out vegetation so had 6 wks of IV abx for SBE.  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  Lives in [place].   Family History  Problem Relation Age of Onset   Other Mother        joint problems   Alzheimer's disease Mother    Hypertension Father    Heart disease Father    CVA Father        age 54   Depression Father    Heart disease Sister        CABG   Stroke Sister    Heart failure Sister        congestive   Diabetes Sister    Heart disease Brother    Hypertension Brother    Congestive Heart Failure Brother     ROS: All systems reviewed and negative except as per HPI.    Current Outpatient Medications  Medication Sig Dispense Refill   albuterol (VENTOLIN HFA) 108 (90 Base) MCG/ACT inhaler Inhale 1-2 puffs into the lungs every 6 (six) hours as needed for shortness of breath or wheezing.     Alirocumab (PRALUENT) 75 MG/ML SOAJ Inject 75 mg into the skin every 14 (fourteen) days.     allopurinol (ZYLOPRIM) 100 MG tablet Take 50 mg by mouth 2 (two) times a week.     amiodarone (PACERONE) 200 MG tablet Take 1 tablet (200 mg total) by mouth in the morning.     ANDROGEL PUMP 20.25 MG/ACT (1.62%) GEL Apply 2 Pump topically daily.     apixaban (ELIQUIS) 5 MG TABS tablet Take 1 tablet (5 mg total) by mouth 2 (two) times daily. 180 tablet 3   ARTIFICIAL SALIVA MT Swish and swallow 1-2 tsp Mouth/Throat once a day if needed     busPIRone (BUSPAR) 10 MG tablet Take 5 mg by   mouth 2 (two) times  daily.     Carboxymethylcellulose Sodium 1 % GEL Place 1 drop into both eyes daily as needed (dry eyes).     cetirizine (ZYRTEC) 10 MG tablet Take 10 mg by mouth in the morning.     colchicine 0.6 MG tablet Take 0.6-1.2 mg by mouth 2 (two) times daily as needed (gout).      Cyanocobalamin (VITAMIN B-12) 5000 MCG TBDP Take 5,000 mcg by mouth 2 (two) times a week.     cycloSPORINE (RESTASIS) 0.05 % ophthalmic emulsion Place 1 drop into both eyes 2 (two) times daily as needed (eye irritation.).     diclofenac Sodium (VOLTAREN) 1 % GEL Apply 2 g topically 3 (three) times daily as needed for pain.     ezetimibe (ZETIA) 10 MG tablet Take 1 tablet (10 mg total) by mouth daily. 90 tablet 3   fluticasone (FLONASE) 50 MCG/ACT nasal spray Place 1 spray into both nostrils daily as needed for allergies or rhinitis.     HYDROcodone-acetaminophen (NORCO) 10-325 MG tablet Take 1 tablet by mouth 3 (three) times daily as needed for moderate pain.     Meclizine HCl 25 MG CHEW CHEW ONE-HALF TABLET BY MOUTH TWICE A DAY FOR VERTIGO **DO NOT TAKE WITH CETRIZINE     methocarbamol (ROBAXIN) 500 MG tablet Take 500 mg by mouth 3 (three) times daily as needed for muscle spasms.     Multiple Vitamin (MULTIVITAMIN) capsule Take 1 capsule by mouth in the morning.     nitroGLYCERIN (NITROSTAT) 0.4 MG SL tablet 1 TAB UNDER THE TONGUE EVERY 5 MINUTES AS NEEDED FOR CHEST PAIN UP TO 3 DOSES, IF PERSIST CALL 911 25 tablet 3   ONETOUCH VERIO test strip 1 each daily.     pantoprazole (PROTONIX) 40 MG tablet Take 40 mg by mouth daily as needed (heartburn).     potassium chloride SA (KLOR-CON M) 20 MEQ tablet Take 20 mEq by mouth daily.     Semaglutide,0.25 or 0.5MG/DOS, 2 MG/3ML SOPN Inject 0.5 mg into the skin once a week. 9 mL 3   tadalafil (CIALIS) 20 MG tablet Take 20 mg by mouth daily as needed.     traZODone (DESYREL) 100 MG tablet Take 100 mg by mouth at bedtime.     docusate sodium (COLACE) 100 MG capsule Take 200 mg by mouth  at bedtime.     ferrous sulfate 325 (65 FE) MG tablet Take 325 mg by mouth every other day.     furosemide (LASIX) 40 MG tablet Take 2 tablets (80 mg total) by mouth every morning AND 1 tablet (40 mg total) every evening. 254 tablet 3   No current facility-administered medications for this encounter.    BP 112/60   Pulse 64   Wt 91.2 kg (201 lb)   SpO2 96%   BMI 29.68 kg/m    Wt Readings from Last 3 Encounters:  02/16/23 91.2 kg (201 lb)  01/14/23 90.2 kg (198 lb 12.8 oz)  01/11/23 90.7 kg (200 lb)   General: NAD Neck: JVP 8-9 cm, no thyromegaly or thyroid nodule.  Lungs: Clear to auscultation bilaterally with normal respiratory effort. CV: Nondisplaced PMI.  Heart irregular S1/S2, no S3/S4, no murmur.  No peripheral edema.  No carotid bruit.  Normal pedal pulses.  Abdomen: Soft, nontender, no hepatosplenomegaly, mild distention.  Skin: Intact without lesions or rashes.  Neurologic: Alert and oriented x 3.  Psych: Normal affect. Extremities: No clubbing or cyanosis.  HEENT: Normal.     Assessment/Plan: 1. S/p mitral valve repair:  Echo in 8/22 showed mild-moderate stenosis with mean gradient 8 mmHg and mild MR.  TEE in 8/23 showed MV repair with mean gradient 10 mmHg but MVA 2.19 cm^2 (leaflets open well), trivial MR.  There is likely a degree of mitral stenosis from a relatively tight repair.  I do not think this is clinically significant.  - With prosthetic material, should use antibiotic prophylaxis with dental work.  - I will arrange for repeat echo to reassess mitral valve.  2. CAD:  He has severe stenosis in a distal D1 that is not amenable to intervention.  This has been stable on last couple of caths. He has occluded RCA and LCx known from the past with SVGs to OM and PDA.  Echo in 2/22 with EF 45-50%. Cardiolite in 11/19 showed no ischemia. Echo in 8/22 with EF up to 55-60%, TEE in 8/23 with EF 55%.  No recent chest pain.   - Currently not on ASA, taking apixaban.  - He  cannot tolerate statins with myalgias, Now on Praluent.    3. Hyperlipidemia: Myalgias with Crestor, pravastatin, and atorvastatin.  [facility]. He is also on Zetia.  - Check lipids today.    4. Chronic primarily diastolic CHF:  TEE in 8/23 with EF 55%, mild RV enlargement with normal RV function.  CHF is worsened by CKD stage IV. NYHA class III symptoms, worse for about a month.  He is volume overloaded on exam.  Suspect worsening of symptoms has been triggered by atrial fibrillation.   - Increase Lasix to 80 qam/40 qpm.  BMET/BNP today, BMET in 10 days.  5. OSA: Continue CPAP.  6. Atrial fibrillation: Last DCCV in 8/23.  Now appears to have been in atrial fibrillation for at least a month with worsening of CHF.  He is  on amiodarone. He has not missed any apixaban doses in the last month or so.  - Continue apixaban 5 mg bid.  - Continue amiodarone 200 mg daily. Check LFTs, TSH today.  He will need a regular eye exam.  - I will arrange for DCCV, should not need TEE. Discussed risks/benefits and he agrees to procedure.  - He will be seeing Dr. Mealor, would like consideration for atrial fibrillation ablation.  7. CKD stage 4: AKI without recovery, uncertain etiology.  He sees nephrology, there is concern that he is progressing towards HD. He now has AV fistula. Recent creatinine 4.22.   - BMET today.  8. Diabetes: He is now followed by endocrinology. 9. Atrial tachycardia: Paroxysmal.  Seen by Dr. Allred, medical management planned.  Continue amiodarone.  10. CVA: Though to be atrial fibrillation-related.  Carotid dopplers 6/22 with mild stenosis only. However, he was also noted on 8/22 echo to have a PFO.  - Continue on Eliquis.  11. Enterococcal bacteremia: In 7/22.  On TEE, it was hard to differentiate between loose suture material and small vegetation.  He was treated for 6 wks with IV abx.   Followup in 3 wks with APP.   [name] 02/17/2023 

## 2023-02-19 ENCOUNTER — Telehealth (HOSPITAL_COMMUNITY): Payer: Self-pay

## 2023-02-19 NOTE — Progress Notes (Signed)
Spoke with patient's wife, Procedure scheduled for  0730, Please arrive at the hospital at 0630 , NPO after midnight on Sunday , May take meds with sips of water in the AM, please have transportation for home post procedure, and someone to stay with pt for approximately 24 hours after  Pt's wife states pt has been taking Eliquis with no missed doses, also states he has stopped taking Ozempic

## 2023-02-19 NOTE — Telephone Encounter (Signed)
Spoke to patient's wife about procedure scheduled for Monday July 1 Aware of time and place,nothing to eat or drink after midnight.Holding semaglutide,holding lasix and glipizide. Has transportation to and from procedure.Not missing any doses of Eliquis

## 2023-02-22 ENCOUNTER — Ambulatory Visit (HOSPITAL_COMMUNITY): Payer: No Typology Code available for payment source | Admitting: Anesthesiology

## 2023-02-22 ENCOUNTER — Other Ambulatory Visit: Payer: Self-pay

## 2023-02-22 ENCOUNTER — Encounter (HOSPITAL_COMMUNITY)
Admission: RE | Disposition: A | Discharge status: Home / Self Care | Payer: Self-pay | Source: Home / Self Care | Attending: Cardiology

## 2023-02-22 ENCOUNTER — Ambulatory Visit (HOSPITAL_COMMUNITY)
Admission: RE | Admit: 2023-02-22 | Discharge: 2023-02-22 | Disposition: A | Discharge status: Home / Self Care | Payer: No Typology Code available for payment source | Attending: Cardiology | Admitting: Cardiology

## 2023-02-22 ENCOUNTER — Encounter (HOSPITAL_COMMUNITY): Payer: Self-pay | Admitting: Cardiology

## 2023-02-22 DIAGNOSIS — I130 Hypertensive heart and chronic kidney disease with heart failure and stage 1 through stage 4 chronic kidney disease, or unspecified chronic kidney disease: Secondary | ICD-10-CM | POA: Insufficient documentation

## 2023-02-22 DIAGNOSIS — Z87891 Personal history of nicotine dependence: Secondary | ICD-10-CM | POA: Diagnosis not present

## 2023-02-22 DIAGNOSIS — I252 Old myocardial infarction: Secondary | ICD-10-CM | POA: Diagnosis not present

## 2023-02-22 DIAGNOSIS — Z7901 Long term (current) use of anticoagulants: Secondary | ICD-10-CM | POA: Diagnosis not present

## 2023-02-22 DIAGNOSIS — N184 Chronic kidney disease, stage 4 (severe): Secondary | ICD-10-CM | POA: Diagnosis not present

## 2023-02-22 DIAGNOSIS — I480 Paroxysmal atrial fibrillation: Secondary | ICD-10-CM

## 2023-02-22 DIAGNOSIS — Z79899 Other long term (current) drug therapy: Secondary | ICD-10-CM | POA: Diagnosis not present

## 2023-02-22 DIAGNOSIS — Z8673 Personal history of transient ischemic attack (TIA), and cerebral infarction without residual deficits: Secondary | ICD-10-CM | POA: Insufficient documentation

## 2023-02-22 DIAGNOSIS — G4733 Obstructive sleep apnea (adult) (pediatric): Secondary | ICD-10-CM | POA: Diagnosis not present

## 2023-02-22 DIAGNOSIS — I4891 Unspecified atrial fibrillation: Secondary | ICD-10-CM | POA: Insufficient documentation

## 2023-02-22 DIAGNOSIS — I5032 Chronic diastolic (congestive) heart failure: Secondary | ICD-10-CM

## 2023-02-22 DIAGNOSIS — I2510 Atherosclerotic heart disease of native coronary artery without angina pectoris: Secondary | ICD-10-CM | POA: Insufficient documentation

## 2023-02-22 DIAGNOSIS — E1122 Type 2 diabetes mellitus with diabetic chronic kidney disease: Secondary | ICD-10-CM

## 2023-02-22 DIAGNOSIS — I4719 Other supraventricular tachycardia: Secondary | ICD-10-CM | POA: Insufficient documentation

## 2023-02-22 DIAGNOSIS — E785 Hyperlipidemia, unspecified: Secondary | ICD-10-CM | POA: Insufficient documentation

## 2023-02-22 DIAGNOSIS — I482 Chronic atrial fibrillation, unspecified: Secondary | ICD-10-CM

## 2023-02-22 HISTORY — PX: CARDIOVERSION: SHX1299

## 2023-02-22 LAB — POCT I-STAT, CHEM 8
BUN: 53 mg/dL — ABNORMAL HIGH (ref 8–23)
Calcium, Ion: 1.25 mmol/L (ref 1.15–1.40)
Chloride: 102 mmol/L (ref 98–111)
Creatinine, Ser: 5.3 mg/dL — ABNORMAL HIGH (ref 0.61–1.24)
Glucose, Bld: 138 mg/dL — ABNORMAL HIGH (ref 70–99)
HCT: 36 % — ABNORMAL LOW (ref 39.0–52.0)
Hemoglobin: 12.2 g/dL — ABNORMAL LOW (ref 13.0–17.0)
Potassium: 3.3 mmol/L — ABNORMAL LOW (ref 3.5–5.1)
Sodium: 139 mmol/L (ref 135–145)
TCO2: 30 mmol/L (ref 22–32)

## 2023-02-22 SURGERY — CARDIOVERSION
Anesthesia: General

## 2023-02-22 MED ORDER — LIDOCAINE 2% (20 MG/ML) 5 ML SYRINGE
INTRAMUSCULAR | Status: DC | PRN
  Administered 2023-02-22: 40 mg via INTRAVENOUS

## 2023-02-22 MED ORDER — SODIUM CHLORIDE 0.9 % IV SOLN: INTRAVENOUS | Status: DC

## 2023-02-22 MED ORDER — PROPOFOL 10 MG/ML IV BOLUS
INTRAVENOUS | Status: DC | PRN
  Administered 2023-02-22: 60 mg via INTRAVENOUS

## 2023-02-22 SURGICAL SUPPLY — 1 items: ELECT DEFIB PAD ADLT CADENCE (PAD) ×1 IMPLANT

## 2023-02-22 NOTE — Interval H&P Note (Signed)
History and Physical Interval Note:  02/22/2023 7:45 AM  [name]  has presented today for surgery, with the diagnosis of AFIB.  The various methods of treatment have been discussed with the patient and family. After consideration of risks, benefits and other options for treatment, the patient has consented to  Procedure(s): CARDIOVERSION (N/A) as a surgical intervention.  The patient's history has been reviewed, patient examined, no change in status, stable for surgery.  I have reviewed the patient's chart and labs.  Questions were answered to the patient's satisfaction.     Bettyjean Stefanski [employer]

## 2023-02-22 NOTE — Transfer of Care (Signed)
Immediate Anesthesia Transfer of Care Note  Patient: [name]  Procedure(s) Performed: CARDIOVERSION  Patient Location: Cath Lab  Anesthesia Type:General  Level of Consciousness: drowsy  Airway & Oxygen Therapy: Patient Spontanous Breathing and Patient connected to nasal cannula oxygen  Post-op Assessment: Report given to RN and Post -op Vital signs reviewed and stable  Post vital signs: Reviewed and stable  Last Vitals:  Vitals Value Taken Time  BP 113/67   Temp    Pulse 61   Resp 16   SpO2 96     Last Pain:  Vitals:   02/22/23 0715  TempSrc: Temporal  PainSc: 7          Complications: No notable events documented.

## 2023-02-22 NOTE — Discharge Instructions (Signed)

## 2023-02-22 NOTE — Procedures (Signed)
Electrical Cardioversion Procedure Note [name] [id number] [birth year]  Procedure: Electrical Cardioversion Indications:  Atrial Fibrillation  Procedure Details Consent: Risks of procedure as well as the alternatives and risks of each were explained to the (patient/caregiver).  Consent for procedure obtained. Time Out: Verified patient identification, verified procedure, site/side was marked, verified correct patient position, special equipment/implants available, medications/allergies/relevent history reviewed, required imaging and test results available.  Performed  Patient placed on cardiac monitor, pulse oximetry, supplemental oxygen as necessary.  Sedation given:  Propofol Pacer pads placed anterior and posterior chest.  Cardioverted 1 time(s).  Cardioverted at 200J.  Evaluation Findings: Post procedure EKG shows: NSR Complications: None Patient did tolerate procedure well.   Marca Ancona 02/22/2023, 7:56 AM

## 2023-02-22 NOTE — Anesthesia Preprocedure Evaluation (Addendum)
Anesthesia Evaluation  Patient identified by MRN, date of birth, ID band Patient awake    Reviewed: Allergy & Precautions, NPO status , Patient's Chart, lab work & pertinent test results  Airway Mallampati: III  TM Distance: >3 FB Neck ROM: Full    Dental  (+) Teeth Intact, Dental Advisory Given   Pulmonary sleep apnea , former smoker   breath sounds clear to auscultation       Cardiovascular hypertension, + CAD, + Past MI, + CABG and +CHF  + dysrhythmias Atrial Fibrillation  Rhythm:Irregular Rate:Bradycardia  Echo: 1. Left ventricular ejection fraction, by estimation, is 55%. The left  ventricle has normal function. The left ventricle has no regional wall  motion abnormalities.   2. Right ventricular systolic function is normal. The right ventricular  size is mildly enlarged. Tricuspid regurgitation signal is inadequate for  assessing PA pressure.   3. Left atrial size was moderately dilated. No left atrial/left atrial  appendage thrombus was detected.   4. Right atrial size was moderately dilated.   5. Status post mitral valve repair. Mean gradient 10 mmHg across the  mitral valve, MVA 2.19 cm^2. The leaflets open well, the valve looks like  it was repaired tightly. Trivial regurgitation.   6. The aortic valve is tricuspid. There is mild calcification of the  aortic valve. Aortic valve regurgitation is not visualized. No aortic  stenosis is present.   7. PFO noted by color doppler.     Neuro/Psych  PSYCHIATRIC DISORDERS Anxiety Depression     Neuromuscular disease CVA    GI/Hepatic Neg liver ROS, PUD,GERD  ,,  Endo/Other  diabetes    Renal/GU Renal disease     Musculoskeletal   Abdominal   Peds  Hematology   Anesthesia Other Findings   Reproductive/Obstetrics                             Anesthesia Physical Anesthesia Plan  ASA: 3  Anesthesia Plan: General   Post-op Pain  Management: Minimal or no pain anticipated   Induction: Intravenous  PONV Risk Score and Plan: 0  Airway Management Planned: Natural Airway and Nasal Cannula  Additional Equipment: None  Intra-op Plan:   Post-operative Plan:   Informed Consent: I have reviewed the patients History and Physical, chart, labs and discussed the procedure including the risks, benefits and alternatives for the proposed anesthesia with the patient or authorized representative who has indicated his/her understanding and acceptance.       Plan Discussed with: CRNA  Anesthesia Plan Comments:        Anesthesia Quick Evaluation

## 2023-02-22 NOTE — Anesthesia Postprocedure Evaluation (Signed)
Anesthesia Post Note  Patient: [name]  Procedure(s) Performed: CARDIOVERSION     Patient location during evaluation: PACU Anesthesia Type: General Level of consciousness: awake and alert Pain management: pain level controlled Vital Signs Assessment: post-procedure vital signs reviewed and stable Respiratory status: spontaneous breathing, nonlabored ventilation, respiratory function stable and patient connected to nasal cannula oxygen Cardiovascular status: blood pressure returned to baseline and stable Postop Assessment: no apparent nausea or vomiting Anesthetic complications: no  No notable events documented.  Last Vitals:  Vitals:   02/22/23 0825 02/22/23 0835  BP: 128/69 127/69  Pulse: 60 61  Resp:    Temp:    SpO2: 98% 99%    Last Pain:  Vitals:   02/22/23 0805  TempSrc: Temporal  PainSc: 0-No pain                 Shelton Silvas

## 2023-02-23 ENCOUNTER — Encounter (HOSPITAL_COMMUNITY): Payer: Self-pay | Admitting: Cardiology

## 2023-03-02 ENCOUNTER — Encounter: Payer: Self-pay | Admitting: Cardiovascular Disease

## 2023-03-02 ENCOUNTER — Ambulatory Visit
Payer: No Typology Code available for payment source | Attending: Cardiovascular Disease | Admitting: Cardiovascular Disease

## 2023-03-02 VITALS — BP 110/66 | HR 64 | Ht 69.0 in | Wt 195.6 lb

## 2023-03-02 DIAGNOSIS — I482 Chronic atrial fibrillation, unspecified: Secondary | ICD-10-CM | POA: Diagnosis not present

## 2023-03-02 NOTE — Patient Instructions (Addendum)
Medication Instructions:  Your physician recommends that you continue on your current medications as directed. Please refer to the Current Medication list given to you today. *If you need a refill on your cardiac medications before your next appointment, please call your pharmacy*   Testing/Procedures: Echocardiogram Your physician has requested that you have an echocardiogram. Echocardiography is a painless test that uses sound waves to create images of your heart. It provides your doctor with information about the size and shape of your heart and how well your heart's chambers and valves are working. This procedure takes approximately one hour. There are no restrictions for this procedure. Please do NOT wear cologne, perfume, aftershave, or lotions (deodorant is allowed). Please arrive 15 minutes prior to your appointment time.   Follow-Up: At [facility], you and your health needs are our priority.  As part of our continuing mission to provide you with exceptional heart care, we have created designated Provider Care Teams.  These Care Teams include your primary Cardiologist (physician) and Advanced Practice Providers (APPs -  Physician Assistants and Nurse Practitioners) who all work together to provide you with the care you need, when you need it.  We recommend signing up for the patient portal called "MyChart".  Sign up information is provided on this After Visit Summary.  MyChart is used to connect with patients for Virtual Visits (Telemedicine).  Patients are able to view lab/test results, encounter notes, upcoming appointments, etc.  Non-urgent messages can be sent to your provider as well.   To learn more about what you can do with MyChart, go to [email or web address].    Your next appointment:   6 week(s)  Provider:   York Pellant, MD

## 2023-03-02 NOTE — Progress Notes (Signed)
Electrophysiology Office Note:    Date:  03/02/2023   ID:  [name], DOB [birth year], MRN [id number]  PCP:  Daisy Floro, MD   [facility] Providers Cardiologist:  Marca Ancona, MD Electrophysiologist:  Hillis Range, MD (Inactive)  Advanced Heart Failure:  Marca Ancona, MD     Referring MD: Danice Goltz, PA   History of Present Illness:    [name] is a 77 y.o. male with a medical history significant for coronary artery disease status post CABG, obesity status post lap band, mitral regurgitation status post repair and maze procedure in 2017 referred for management of atrial fibrillation.     Patient's arrhythmia history dates back to December 2018.  Noted to have both atrial fibrillation and flutter.  He saw Dr. Johney Frame, who recommended DC cardioversion with medical therapy if he has recurrence.  Follow-up with Dr. Johney Frame in 2022, the patient was on amiodarone.  He has had DC cardioversions in 2019, January 2021, February 2021, August 2023, July 2024.          Today he reports that he is doing well.  He feels much better after his most recent cardioversion  EKGs/Labs/Other Studies Reviewed Today:    Echocardiogram:  TEE August 2023 EF 55%, left atrium moderately dilated, right atrium moderately dilated  TTE August 2022 Ejection fraction 55 to 60%, severely dilated left atrium.  Mild to moderate mitral stenosis   Monitors:  14-day ZIO - 02/11/2023 Device read - 96% atrial fibrillation burden, heart rate range 44 to the 144 bpm, average 65 bpm.  I think much of what is interpreted as atrial fibrillation is actually a sinus rhythm with PACs and low atrial amplitude.  Stress testing:   Advanced imaging:   Cardiac catherization   EKG:   EKG Interpretation Date/Time:  Tuesday March 02 2023 08:45:33 EDT Ventricular Rate:  64 PR Interval:    QRS Duration:  148 QT Interval:  466 QTC Calculation: 480 R Axis:   270  Text  Interpretation: Sinus rhythym with PACs Right bundle branch block When compared with ECG of 22-Feb-2023 08:03, Pacs are present Confirmed by York Pellant [phone]) on 03/02/2023 9:04:49 AM     Physical Exam:    VS:  BP 110/66   Pulse 64   Ht 5\' 9"  (1.753 m)   Wt 195 lb 9.6 oz (88.7 kg)   SpO2 97%   BMI 28.89 kg/m     Wt Readings from Last 3 Encounters:  03/02/23 195 lb 9.6 oz (88.7 kg)  02/16/23 201 lb (91.2 kg)  01/14/23 198 lb 12.8 oz (90.2 kg)     GEN: Well nourished, well developed in no acute distress CARDIAC: RRR, no murmurs, rubs, gallops RESPIRATORY:  Normal work of breathing MUSCULOSKELETAL: trace edema    ASSESSMENT & PLAN:    Persistent atrial fibrillation Status post Maze procedure in 2017, long-term amiodarone use Status post multiple cardioversions -- he feels much better after the recent cardioversion Left atrium severely enlarged in the past, moderately large most recent TEE Low P wave amplitude I suspect the chance of durable sinus rhythm is low, and ablation will be very difficult given his history of mitral valve surgery and Maze procedure I will see him again after his upcoming echo.   After getting some experience with possible ablation, we may consider repeat ablation with a new modality  History of mitral valve prolapse status post mitral valve repair Mean gradient 10 mmHg on TEE August 2023  Limited substructure, possible suture noted echocardiogram the past  Coronary artery disease Status post CABG with occluded RCA and left circumflex grafts Severe stenosis of distal to D1 not amenable to intervention. Most recent stress test 2019 showed no ischemia  Congestive heart failure, predominantly with preserved ejection fraction Certainly exacerbated by atrial fibrillation  Atrial tachycardia Has been managed with amiodarone  Presence of PFO   History of mitral valve prolapse status post mitral valve repair   Signed, Maurice Small, MD   03/02/2023 9:14 AM    [facility]

## 2023-03-08 NOTE — Progress Notes (Signed)
Patient ID: [name], male   DOB: 04/25/1946, 77 y.o.   MRN: [id number] PCP: Dr Tenny Craw Cardiology: Dr. Shirlee Latch  77 y.o. with history of CAD s/p CABG and obesity s/p lap banding procedure presents for cardiology followup.  Given exertional dyspnea and chest tightness, he had LHC in 1/15.  This showed no change from prior.  Grafts were patent and there was severe stenosis in distal D1, not amenable to PCI.   In 2017, he developed increased exertional dyspnea.  Eventually had a TEE showing severe MR with prolapse of a portion of the anterior mitral valve leaflet.  Coronary angiography in 6/17 showed patent grafts and 90% stenosis of distal diagonal not amenable to intervention.  In 8/17, he had mitral valve repair.  Post-op diastolic CHF, Lasix started and increased.   He had a cath again in 9/18 showing stable coronary anatomy.    He developed atrial arrhythmias in 12/18.  Both fibrillation and flutter were seen.  Saw Dr. Johney Frame, recommended DCCV with amiodarone or sotalol if fibrillation recurs.  He had DCCV in 1/19.   In 6/19, he was noted to have developed AKI. Creatinine went up to 3.46.  Renal US was done, showing normal kidneys. Cause of AKI was not clear.  We stopped his Lasix. He saw nephrology and eventually went back on Lasix, currently taking 80 mg daily.  He is not using any NSAIDs.    He stopped pravastatin due to myalgias. We struggled to get him on a PCSK9 inhibitor through the [place] but he was finally able to get Praluent.    I had him do a Lexiscan Cardiolite in 11/19, which showed old inferior MI with no ischemia.  Echo in 11/19 showed EF 55-60%, s/p MV repair with mean gradient 6 mmHg. Echo 12/20 showed EF 50-55%, mildly decreased RV systolic function, repaired mitral valve with mild MR, mean gradient 6 mmHg across MV.    He had DCCV from atrial fibrillation to NSR in 1/21 and again in 2/21.   At last appointment, he reported significant fatigue and dyspnea in setting of weight  loss with low BP and HR.  I stopped his Coreg. He felt better with that change.    In 9/21, he had COVID-19 infection but was not hospitalized.   Zio patch in 3/22 showed frequent short atrial tachycardia episodes.  He saw Dr. Johney Frame, medical therapy recommended (on amiodarone).   He was admitted in 5/22 with AKI from obstruction of right ureter by stone, he was treated by urology.   He was admitted in 6/22 with right-sided weakness and was found to have left cerebral white matter CVA.  He had been compliant with Eliquis, ASA 81 was added.   He was admitted in 7/22 with Enterococcal bacteremia.  TEE showed oscillating density attached to the mitral annulus, ?suture.  However, could not rule out vegetation so had 6 wks of IV abx for SBE.   Echo in 8/22 showed EF 55-60%, moderate focal basal septal hypertrophy, normal RV, severe LAE, s/p mitral valve repair with mean gradient 8 mmHg (mild to moderate mitral stenosis) and mild MR, large PFO, normal IVC.   Patient was noted to re-develop atrial fibrillation.  TEE was done in 8/23, LV EF was 55% with mild RV enlargement and normal RV function, s/p MV repair with mean gradient 10 mmHg but MVA 2.19 cm^2 (leaflets open well), trivial MR.  He was cardioverted back to NSR.   Today he returns for HF follow up.  Patient is in atrial fibrillation with controlled rate today, he appears to have been in atrial fibrillation for about a month.  Zio monitor in 6/24 showed continuous AF.  He is using his CPAP.  He has felt worse over the last 1-2 months.  He is short of breath walking around his house.  Weight is up 4 lbs. No orthopnea/PND.  No lightheadedness.  Significant fatigue.   ECG (personally reviewed): Atrila fibrillation rate 56, RBBB, LAFB  Labs (11/12): LDL 70 Labs (5/13): K 4.1, creatinine 1.1, BNP 48 Labs (12/13): K 3.4, creatinine 1.07, LDL 59, HDL 37 Labs (1/15): K 4.3, creatinine 1.2, HCT 40.3 Labs (3/15): LDL 38, HDL 81, BNP 55, K 4, creatinine  1.2 Labs (6/15): K 4.2, creatinine 1.4, LFTs normal, LDL 66, HDL 35 Labs (8/16): K 4, creatinine 1.6, BNP 64 Labs (12/16): LDL 61, HDL 37 Labs (4/17): K 4.2, creatinine 1.35, HCT 38.7, BNP 80 Labs (8/17): K 4.2, creatinine 1.4 => 1.66, BNP 159 Labs (12/17): K 3.6, creatinine 1.75 Labs (2/18): LDL 76, HDL 36 Labs (9/18): K 3.9, creatinine 1.76 Labs (1/19): K 4.9, creatinine 1.76, hgb 14 Labs (6/19): K 3.6, creatinine 3.46 Labs (7/19): K 4.1, creatinine 3.66, hgb 12.7 Labs (10/19): LDL 177, hgb 13.4, troponin negative, K 4, creatinine 4.15 Labs (12/19): K 4.3, creatinine 3.88 Labs (4/20): K 3.6, creatinine 3.39 Labs (11/20): LDL 73, HDL 38 Labs (2/21): K 3.4, creatinine 3.8 Labs (7/21): K 3.7, creatinine 3.66 Labs (6/22): K 4, creatinine 2.88, LDL 50 Labs (8/22): K 3.6, creatinine 2.79 Labs (1/23): K 4.3, creatinine 3.21 Labs (4/23): LDL 100 Labs (8/23): K 3.5, creatinine 4.56, hgb 10.3 Labs (4/24): K 3.3, creatinine 4.22, LFTs normal, TSH normal  PMH: 1. OSA: on CPAP.  2. Diabetes mellitus 3. Allergic rhinitis 4. CAD: s/p CABG 1995.  LHC (10/11) with 90% distal D1 (patent proximal D1 stent), total occlusion CFX, total occlusion RCA, no significant stenoses in LAD, SVG-PDA patent, SVG-OM patent, EF 55%.  Medical management.  LHC (5/13) with patent grafts and 90% distal D1 stenosis (no significant change from 10/11).  LHC (1/15) with patent grafts, 75% ISR in D1 stent, 90% stenosis distal D1 (small caliber). D1 not amenable to intervention.  - Coronary angiography (6/17): grafts patent, 90% stenosis distally in large diagonal not amenable to intervention.   - LHC (9/18): SVG-OM and SVG-PDA patent,  75% in-stent restenosis in proximal diagonal then 90% stenosis distally (small in caliber at that point), totally occluded LCx and RCA.  Left coronary system similar to prior caths, D1 may be source of angina.  - Lexiscan Cardiolite (11/19): EF 47%, old inferior MI, no ischemia.  5. Obesity:  lap-banding in 5/11.  6. Chronic diastolic CHF: Echo (8/17) with EF 55-60%, moderate LVH, moderate diastolic dysfunction, s/p mitral valve repair with elevated mean gradient but normal pressure half-time.  - Echo (8/18): EF 55-60%, s/p MV repair with mean gradient 6 mmHg, PASP 28 mmHg, mildly decreased RV systolic function.  - Echo (11/19): EF 55-60%, s/p MV repair with mean gradient 6, PASP 42 mmHg, mildly dilated RV, dilated IVC.  - Echo (12/20): EF 50-55%, mildly decreased RV systolic function. S/p MV repair with mean gradient 6 mmHg, mild MR.  - Echo (2/22): EF 45-50%, mild LVH, moderate RV enlargement with low normal systolic function, s/p mitral valve repair with mean gradient 5 mmHg and trivial MR, mild-moderate AS.  - Echo (8/22): EF 55-60%, moderate focal basal septal hypertrophy, normal RV, severe LAE, s/p mitral  valve repair with mean gradient 8 mmHg (mild to moderate mitral stenosis) and mild MR, large PFO, normal IVC. - TEE (8/23): LV EF was 55% with mild RV enlargement and normal RV function, s/p MV repair with mean gradient 10 mmHg but MVA 2.19 cm^2 (leaflets open well), trivial MR. 7. PFO: noted on 8/22 echo.  8. OA: Knees, C-spine. TKR 9/13.  9. HTN 10. Hyperlipidemia: Myalgias with atorvastatin and Crestor. Now on Praluent.  11. Diverticulosis 12. Mitral regurgitation: TEE (6/17) with EF 55-60%, severe eccentric MR, prolapse of a portion of the anterior leaflet, peak RV-RA gradient 45 mmHg.  - Mitral valve repair 8/17 - Echo in 8/22 with repaired MV, mean gradient 8 mmHg (mild-moderate MS), mild MR.  - TEE in 8/23 with MV repair with mean gradient 10 mmHg but MVA 2.19 cm^2 (leaflets open well), trivial MR. 13. CKD: Stage III.  14. Atrial fibrillation/atrial flutter: Both arrhythmias noted in 12/18.  DCCV 12/19.   - DCCV to NSR 1/21.  - DCCV to NSR 2/21.  - DCCV to NSR in 8/23 15. AKI 6/19 => CKD stage 4: uncertain etiology.  Renal US was normal.  16. Gout 17. COVID-19  infection 9/21 18. 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  - Carotid dopplers (6/22) with mild disease only.  19. Nephrolithiasis.  20. Atrial tachycardia: Noted on 3/22 Zio patch.  21. Aortic stenosis: mild-moderate on 2/22 echo but no significant stenosis on 8/22 echo.  - No AS on TEE in 8/23.  22. 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  TEE 7/22 with oscillating density attached to the mitral annulus, ?suture.  However, could not rule out vegetation so had 6 wks of IV abx for SBE.  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  Lives in [place].   Family History  Problem Relation Age of Onset   Other Mother        joint problems   Alzheimer's disease Mother    Hypertension Father    Heart disease Father    CVA Father        age 25   Depression Father    Heart disease Sister        CABG   Stroke Sister    Heart failure Sister        congestive   Diabetes Sister    Heart disease Brother    Hypertension Brother    Congestive Heart Failure Brother     ROS: All systems reviewed and negative except as per HPI.    Current Outpatient Medications  Medication Sig Dispense Refill   albuterol (VENTOLIN HFA) 108 (90 Base) MCG/ACT inhaler Inhale 1-2 puffs into the lungs every 6 (six) hours as needed for shortness of breath or wheezing.     Alirocumab (PRALUENT) 75 MG/ML SOAJ Inject 75 mg into the skin every 14 (fourteen) days.     allopurinol (ZYLOPRIM) 100 MG tablet Take 50 mg by mouth 2 (two) times a week.     amiodarone (PACERONE) 200 MG tablet Take 1 tablet (200 mg total) by mouth in the morning.     ANDROGEL PUMP 20.25 MG/ACT (1.62%) GEL Apply 2 Pump topically daily.     apixaban (ELIQUIS) 5 MG TABS tablet Take 1 tablet (5 mg total) by mouth 2 (two) times daily. 180 tablet 3   ARTIFICIAL SALIVA MT Swish and swallow 1-2 tsp Mouth/Throat once a day if needed     busPIRone (BUSPAR) 10 MG tablet Take 5 mg by  mouth 2 (two) times  daily.     Carboxymethylcellulose Sodium 1 % GEL Place 1 drop into both eyes daily as needed (dry eyes).     cetirizine (ZYRTEC) 10 MG tablet Take 10 mg by mouth in the morning.     colchicine 0.6 MG tablet Take 0.6-1.2 mg by mouth 2 (two) times daily as needed (gout).      Cyanocobalamin (VITAMIN B-12) 5000 MCG TBDP Take 5,000 mcg by mouth 2 (two) times a week.     cycloSPORINE (RESTASIS) 0.05 % ophthalmic emulsion Place 1 drop into both eyes 2 (two) times daily as needed (eye irritation.).     diclofenac Sodium (VOLTAREN) 1 % GEL Apply 2 g topically 3 (three) times daily as needed for pain.     docusate sodium (COLACE) 100 MG capsule Take 200 mg by mouth at bedtime.     ezetimibe (ZETIA) 10 MG tablet Take 1 tablet (10 mg total) by mouth daily. 90 tablet 3   ferrous sulfate 325 (65 FE) MG tablet Take 325 mg by mouth every other day.     fluticasone (FLONASE) 50 MCG/ACT nasal spray Place 1 spray into both nostrils daily as needed for allergies or rhinitis.     furosemide (LASIX) 40 MG tablet Take 2 tablets (80 mg total) by mouth every morning AND 1 tablet (40 mg total) every evening. 254 tablet 3   HYDROcodone-acetaminophen (NORCO) 10-325 MG tablet Take 1 tablet by mouth 3 (three) times daily as needed for moderate pain.     Meclizine HCl 25 MG CHEW CHEW ONE-HALF TABLET BY MOUTH TWICE A DAY FOR VERTIGO **DO NOT TAKE WITH CETRIZINE     methocarbamol (ROBAXIN) 500 MG tablet Take 500 mg by mouth 3 (three) times daily as needed for muscle spasms.     Multiple Vitamin (MULTIVITAMIN) capsule Take 1 capsule by mouth in the morning.     nitroGLYCERIN (NITROSTAT) 0.4 MG SL tablet 1 TAB UNDER THE TONGUE EVERY 5 MINUTES AS NEEDED FOR CHEST PAIN UP TO 3 DOSES, IF PERSIST CALL 911 25 tablet 3   ONETOUCH VERIO test strip 1 each daily.     pantoprazole (PROTONIX) 40 MG tablet Take 40 mg by mouth daily as needed (heartburn).     potassium chloride SA (KLOR-CON M) 20 MEQ tablet Take 20 mEq by mouth daily.      Semaglutide,0.25 or 0.5MG /DOS, 2 MG/3ML SOPN Inject 0.5 mg into the skin once a week. 9 mL 3   tadalafil (CIALIS) 20 MG tablet Take 20 mg by mouth daily as needed.     testosterone cypionate (DEPOTESTOSTERONE CYPIONATE) 200 MG/ML injection Inject 200 mg into the muscle once a week.     traZODone (DESYREL) 100 MG tablet Take 100 mg by mouth at bedtime.     No current facility-administered medications for this visit.    There were no vitals taken for this visit.   Wt Readings from Last 3 Encounters:  03/02/23 88.7 kg (195 lb 9.6 oz)  02/16/23 91.2 kg (201 lb)  01/14/23 90.2 kg (198 lb 12.8 oz)   General: NAD Neck: JVP 8-9 cm, no thyromegaly or thyroid nodule.  Lungs: Clear to auscultation bilaterally with normal respiratory effort. CV: Nondisplaced PMI.  Heart irregular S1/S2, no S3/S4, no murmur.  No peripheral edema.  No carotid bruit.  Normal pedal pulses.  Abdomen: Soft, nontender, no hepatosplenomegaly, mild distention.  Skin: Intact without lesions or rashes.  Neurologic: Alert and oriented x 3.  Psych: Normal affect. Extremities:  No clubbing or cyanosis.  HEENT: Normal.   Assessment/Plan: 1. S/p mitral valve repair:  Echo in 8/22 showed mild-moderate stenosis with mean gradient 8 mmHg and mild MR.  TEE in 8/23 showed MV repair with mean gradient 10 mmHg but MVA 2.19 cm^2 (leaflets open well), trivial MR.  There is likely a degree of mitral stenosis from a relatively tight repair.  I do not think this is clinically significant.  - With prosthetic material, should use antibiotic prophylaxis with dental work.  - I will arrange for repeat echo to reassess mitral valve.  2. CAD:  He has severe stenosis in a distal D1 that is not amenable to intervention.  This has been stable on last couple of caths. He has occluded RCA and LCx known from the past with SVGs to OM and PDA.  Echo in 2/22 with EF 45-50%. Cardiolite in 11/19 showed no ischemia. Echo in 8/22 with EF up to 55-60%, TEE in 8/23  with EF 55%.  No recent chest pain.   - Currently not on ASA, taking apixaban.  - He cannot tolerate statins with myalgias, Now on Praluent.    3. Hyperlipidemia: Myalgias with Crestor, pravastatin, and atorvastatin.  We were finally able to get him Praluent through the [place]. He is also on Zetia.  - Check lipids today.    4. Chronic primarily diastolic CHF:  TEE in 8/23 with EF 55%, mild RV enlargement with normal RV function.  CHF is worsened by CKD stage IV. NYHA class III symptoms, worse for about a month.  He is volume overloaded on exam.  Suspect worsening of symptoms has been triggered by atrial fibrillation.   - Increase Lasix to 80 qam/40 qpm.  BMET/BNP today, BMET in 10 days.  5. OSA: Continue CPAP.  6. Atrial fibrillation: Last DCCV in 8/23.  Now appears to have been in atrial fibrillation for at least a month with worsening of CHF.  He is  on amiodarone. He has not missed any apixaban doses in the last month or so.  - Continue apixaban 5 mg bid.  - Continue amiodarone 200 mg daily. Check LFTs, TSH today.  He will need a regular eye exam.  - I will arrange for DCCV, should not need TEE. Discussed risks/benefits and he agrees to procedure.  - He will be seeing Dr. Nelly Laurence, would like consideration for atrial fibrillation ablation.  7. CKD stage 4: AKI without recovery, uncertain etiology.  He sees nephrology, there is concern that he is progressing towards HD. He now has AV fistula. Recent creatinine 4.22.   - BMET today.  8. Diabetes: He is now followed by endocrinology. 9. Atrial tachycardia: Paroxysmal.  Seen by Dr. Johney Frame, medical management planned.  Continue amiodarone.  10. CVA: Though to be atrial fibrillation-related.  Carotid dopplers 6/22 with mild stenosis only. However, he was also noted on 8/22 echo to have a PFO.  - Continue on Eliquis.  11. Enterococcal bacteremia: In 7/22.  On TEE, it was hard to differentiate between loose suture material and small vegetation.  He was  treated for 6 wks with IV abx.   Followup in 3 wks with APP.   [facility] 03/08/2023

## 2023-03-08 NOTE — H&P (View-Only) (Signed)
Patient ID: [name], male   DOB: 04/25/1946, 77 y.o.   MRN: [id number] PCP: Dr Tenny Craw Cardiology: Dr. Shirlee Latch  77 y.o. with history of CAD s/p CABG and obesity s/p lap banding procedure presents for cardiology followup.  Given exertional dyspnea and chest tightness, he had LHC in 1/15.  This showed no change from prior.  Grafts were patent and there was severe stenosis in distal D1, not amenable to PCI.   In 2017, he developed increased exertional dyspnea.  Eventually had a TEE showing severe MR with prolapse of a portion of the anterior mitral valve leaflet.  Coronary angiography in 6/17 showed patent grafts and 90% stenosis of distal diagonal not amenable to intervention.  In 8/17, he had mitral valve repair.  Post-op diastolic CHF, Lasix started and increased.   He had a cath again in 9/18 showing stable coronary anatomy.    He developed atrial arrhythmias in 12/18.  Both fibrillation and flutter were seen.  Saw Dr. Johney Frame, recommended DCCV with amiodarone or sotalol if fibrillation recurs.  He had DCCV in 1/19.   In 6/19, he was noted to have developed AKI. Creatinine went up to 3.46.  Renal US was done, showing normal kidneys. Cause of AKI was not clear.  We stopped his Lasix. He saw nephrology and eventually went back on Lasix, currently taking 80 mg daily.  He is not using any NSAIDs.    He stopped pravastatin due to myalgias. We struggled to get him on a PCSK9 inhibitor through the [place] but he was finally able to get Praluent.    I had him do a Lexiscan Cardiolite in 11/19, which showed old inferior MI with no ischemia.  Echo in 11/19 showed EF 55-60%, s/p MV repair with mean gradient 6 mmHg. Echo 12/20 showed EF 50-55%, mildly decreased RV systolic function, repaired mitral valve with mild MR, mean gradient 6 mmHg across MV.    He had DCCV from atrial fibrillation to NSR in 1/21 and again in 2/21.   At last appointment, he reported significant fatigue and dyspnea in setting of weight  loss with low BP and HR.  I stopped his Coreg. He felt better with that change.    In 9/21, he had COVID-19 infection but was not hospitalized.   Zio patch in 3/22 showed frequent short atrial tachycardia episodes.  He saw Dr. Johney Frame, medical therapy recommended (on amiodarone).   He was admitted in 5/22 with AKI from obstruction of right ureter by stone, he was treated by urology.   He was admitted in 6/22 with right-sided weakness and was found to have left cerebral white matter CVA.  He had been compliant with Eliquis, ASA 81 was added.   He was admitted in 7/22 with Enterococcal bacteremia.  TEE showed oscillating density attached to the mitral annulus, ?suture.  However, could not rule out vegetation so had 6 wks of IV abx for SBE.   Echo in 8/22 showed EF 55-60%, moderate focal basal septal hypertrophy, normal RV, severe LAE, s/p mitral valve repair with mean gradient 8 mmHg (mild to moderate mitral stenosis) and mild MR, large PFO, normal IVC.   Patient was noted to re-develop atrial fibrillation.  TEE was done in 8/23, LV EF was 55% with mild RV enlargement and normal RV function, s/p MV repair with mean gradient 10 mmHg but MVA 2.19 cm^2 (leaflets open well), trivial MR.  He was cardioverted back to NSR.   Today he returns for HF follow up.  Patient is in atrial fibrillation with controlled rate today, he appears to have been in atrial fibrillation for about a month.  Zio monitor in 6/24 showed continuous AF.  He is using his CPAP.  He has felt worse over the last 1-2 months.  He is short of breath walking around his house.  Weight is up 4 lbs. No orthopnea/PND.  No lightheadedness.  Significant fatigue.   ECG (personally reviewed): Atrila fibrillation rate 56, RBBB, LAFB  Labs (11/12): LDL 70 Labs (5/13): K 4.1, creatinine 1.1, BNP 48 Labs (12/13): K 3.4, creatinine 1.07, LDL 59, HDL 37 Labs (1/15): K 4.3, creatinine 1.2, HCT 40.3 Labs (3/15): LDL 38, HDL 81, BNP 55, K 4, creatinine  1.2 Labs (6/15): K 4.2, creatinine 1.4, LFTs normal, LDL 66, HDL 35 Labs (8/16): K 4, creatinine 1.6, BNP 64 Labs (12/16): LDL 61, HDL 37 Labs (4/17): K 4.2, creatinine 1.35, HCT 38.7, BNP 80 Labs (8/17): K 4.2, creatinine 1.4 => 1.66, BNP 159 Labs (12/17): K 3.6, creatinine 1.75 Labs (2/18): LDL 76, HDL 36 Labs (9/18): K 3.9, creatinine 1.76 Labs (1/19): K 4.9, creatinine 1.76, hgb 14 Labs (6/19): K 3.6, creatinine 3.46 Labs (7/19): K 4.1, creatinine 3.66, hgb 12.7 Labs (10/19): LDL 177, hgb 13.4, troponin negative, K 4, creatinine 4.15 Labs (12/19): K 4.3, creatinine 3.88 Labs (4/20): K 3.6, creatinine 3.39 Labs (11/20): LDL 73, HDL 38 Labs (2/21): K 3.4, creatinine 3.8 Labs (7/21): K 3.7, creatinine 3.66 Labs (6/22): K 4, creatinine 2.88, LDL 50 Labs (8/22): K 3.6, creatinine 2.79 Labs (1/23): K 4.3, creatinine 3.21 Labs (4/23): LDL 100 Labs (8/23): K 3.5, creatinine 4.56, hgb 10.3 Labs (4/24): K 3.3, creatinine 4.22, LFTs normal, TSH normal  PMH: 1. OSA: on CPAP.  2. Diabetes mellitus 3. Allergic rhinitis 4. CAD: s/p CABG 1995.  LHC (10/11) with 90% distal D1 (patent proximal D1 stent), total occlusion CFX, total occlusion RCA, no significant stenoses in LAD, SVG-PDA patent, SVG-OM patent, EF 55%.  Medical management.  LHC (5/13) with patent grafts and 90% distal D1 stenosis (no significant change from 10/11).  LHC (1/15) with patent grafts, 75% ISR in D1 stent, 90% stenosis distal D1 (small caliber). D1 not amenable to intervention.  - Coronary angiography (6/17): grafts patent, 90% stenosis distally in large diagonal not amenable to intervention.   - LHC (9/18): SVG-OM and SVG-PDA patent,  75% in-stent restenosis in proximal diagonal then 90% stenosis distally (small in caliber at that point), totally occluded LCx and RCA.  Left coronary system similar to prior caths, D1 may be source of angina.  - Lexiscan Cardiolite (11/19): EF 47%, old inferior MI, no ischemia.  5. Obesity:  lap-banding in 5/11.  6. Chronic diastolic CHF: Echo (8/17) with EF 55-60%, moderate LVH, moderate diastolic dysfunction, s/p mitral valve repair with elevated mean gradient but normal pressure half-time.  - Echo (8/18): EF 55-60%, s/p MV repair with mean gradient 6 mmHg, PASP 28 mmHg, mildly decreased RV systolic function.  - Echo (11/19): EF 55-60%, s/p MV repair with mean gradient 6, PASP 42 mmHg, mildly dilated RV, dilated IVC.  - Echo (12/20): EF 50-55%, mildly decreased RV systolic function. S/p MV repair with mean gradient 6 mmHg, mild MR.  - Echo (2/22): EF 45-50%, mild LVH, moderate RV enlargement with low normal systolic function, s/p mitral valve repair with mean gradient 5 mmHg and trivial MR, mild-moderate AS.  - Echo (8/22): EF 55-60%, moderate focal basal septal hypertrophy, normal RV, severe LAE, s/p mitral  valve repair with mean gradient 8 mmHg (mild to moderate mitral stenosis) and mild MR, large PFO, normal IVC. - TEE (8/23): LV EF was 55% with mild RV enlargement and normal RV function, s/p MV repair with mean gradient 10 mmHg but MVA 2.19 cm^2 (leaflets open well), trivial MR. 7. PFO: noted on 8/22 echo.  8. OA: Knees, C-spine. TKR 9/13.  9. HTN 10. Hyperlipidemia: Myalgias with atorvastatin and Crestor. Now on Praluent.  11. Diverticulosis 12. Mitral regurgitation: TEE (6/17) with EF 55-60%, severe eccentric MR, prolapse of a portion of the anterior leaflet, peak RV-RA gradient 45 mmHg.  - Mitral valve repair 8/17 - Echo in 8/22 with repaired MV, mean gradient 8 mmHg (mild-moderate MS), mild MR.  - TEE in 8/23 with MV repair with mean gradient 10 mmHg but MVA 2.19 cm^2 (leaflets open well), trivial MR. 13. CKD: Stage III.  14. Atrial fibrillation/atrial flutter: Both arrhythmias noted in 12/18.  DCCV 12/19.   - DCCV to NSR 1/21.  - DCCV to NSR 2/21.  - DCCV to NSR in 8/23 15. AKI 6/19 => CKD stage 4: uncertain etiology.  Renal US was normal.  16. Gout 17. COVID-19  infection 9/21 18. 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  - Carotid dopplers (6/22) with mild disease only.  19. Nephrolithiasis.  20. Atrial tachycardia: Noted on 3/22 Zio patch.  21. Aortic stenosis: mild-moderate on 2/22 echo but no significant stenosis on 8/22 echo.  - No AS on TEE in 8/23.  22. 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  TEE 7/22 with oscillating density attached to the mitral annulus, ?suture.  However, could not rule out vegetation so had 6 wks of IV abx for SBE.  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  Lives in [place].   Family History  Problem Relation Age of Onset   Other Mother        joint problems   Alzheimer's disease Mother    Hypertension Father    Heart disease Father    CVA Father        age 25   Depression Father    Heart disease Sister        CABG   Stroke Sister    Heart failure Sister        congestive   Diabetes Sister    Heart disease Brother    Hypertension Brother    Congestive Heart Failure Brother     ROS: All systems reviewed and negative except as per HPI.    Current Outpatient Medications  Medication Sig Dispense Refill   albuterol (VENTOLIN HFA) 108 (90 Base) MCG/ACT inhaler Inhale 1-2 puffs into the lungs every 6 (six) hours as needed for shortness of breath or wheezing.     Alirocumab (PRALUENT) 75 MG/ML SOAJ Inject 75 mg into the skin every 14 (fourteen) days.     allopurinol (ZYLOPRIM) 100 MG tablet Take 50 mg by mouth 2 (two) times a week.     amiodarone (PACERONE) 200 MG tablet Take 1 tablet (200 mg total) by mouth in the morning.     ANDROGEL PUMP 20.25 MG/ACT (1.62%) GEL Apply 2 Pump topically daily.     apixaban (ELIQUIS) 5 MG TABS tablet Take 1 tablet (5 mg total) by mouth 2 (two) times daily. 180 tablet 3   ARTIFICIAL SALIVA MT Swish and swallow 1-2 tsp Mouth/Throat once a day if needed     busPIRone (BUSPAR) 10 MG tablet Take 5 mg by  mouth 2 (two) times  daily.     Carboxymethylcellulose Sodium 1 % GEL Place 1 drop into both eyes daily as needed (dry eyes).     cetirizine (ZYRTEC) 10 MG tablet Take 10 mg by mouth in the morning.     colchicine 0.6 MG tablet Take 0.6-1.2 mg by mouth 2 (two) times daily as needed (gout).      Cyanocobalamin (VITAMIN B-12) 5000 MCG TBDP Take 5,000 mcg by mouth 2 (two) times a week.     cycloSPORINE (RESTASIS) 0.05 % ophthalmic emulsion Place 1 drop into both eyes 2 (two) times daily as needed (eye irritation.).     diclofenac Sodium (VOLTAREN) 1 % GEL Apply 2 g topically 3 (three) times daily as needed for pain.     docusate sodium (COLACE) 100 MG capsule Take 200 mg by mouth at bedtime.     ezetimibe (ZETIA) 10 MG tablet Take 1 tablet (10 mg total) by mouth daily. 90 tablet 3   ferrous sulfate 325 (65 FE) MG tablet Take 325 mg by mouth every other day.     fluticasone (FLONASE) 50 MCG/ACT nasal spray Place 1 spray into both nostrils daily as needed for allergies or rhinitis.     furosemide (LASIX) 40 MG tablet Take 2 tablets (80 mg total) by mouth every morning AND 1 tablet (40 mg total) every evening. 254 tablet 3   HYDROcodone-acetaminophen (NORCO) 10-325 MG tablet Take 1 tablet by mouth 3 (three) times daily as needed for moderate pain.     Meclizine HCl 25 MG CHEW CHEW ONE-HALF TABLET BY MOUTH TWICE A DAY FOR VERTIGO **DO NOT TAKE WITH CETRIZINE     methocarbamol (ROBAXIN) 500 MG tablet Take 500 mg by mouth 3 (three) times daily as needed for muscle spasms.     Multiple Vitamin (MULTIVITAMIN) capsule Take 1 capsule by mouth in the morning.     nitroGLYCERIN (NITROSTAT) 0.4 MG SL tablet 1 TAB UNDER THE TONGUE EVERY 5 MINUTES AS NEEDED FOR CHEST PAIN UP TO 3 DOSES, IF PERSIST CALL 911 25 tablet 3   ONETOUCH VERIO test strip 1 each daily.     pantoprazole (PROTONIX) 40 MG tablet Take 40 mg by mouth daily as needed (heartburn).     potassium chloride SA (KLOR-CON M) 20 MEQ tablet Take 20 mEq by mouth daily.      Semaglutide,0.25 or 0.5MG /DOS, 2 MG/3ML SOPN Inject 0.5 mg into the skin once a week. 9 mL 3   tadalafil (CIALIS) 20 MG tablet Take 20 mg by mouth daily as needed.     testosterone cypionate (DEPOTESTOSTERONE CYPIONATE) 200 MG/ML injection Inject 200 mg into the muscle once a week.     traZODone (DESYREL) 100 MG tablet Take 100 mg by mouth at bedtime.     No current facility-administered medications for this visit.    There were no vitals taken for this visit.   Wt Readings from Last 3 Encounters:  03/02/23 88.7 kg (195 lb 9.6 oz)  02/16/23 91.2 kg (201 lb)  01/14/23 90.2 kg (198 lb 12.8 oz)   General: NAD Neck: JVP 8-9 cm, no thyromegaly or thyroid nodule.  Lungs: Clear to auscultation bilaterally with normal respiratory effort. CV: Nondisplaced PMI.  Heart irregular S1/S2, no S3/S4, no murmur.  No peripheral edema.  No carotid bruit.  Normal pedal pulses.  Abdomen: Soft, nontender, no hepatosplenomegaly, mild distention.  Skin: Intact without lesions or rashes.  Neurologic: Alert and oriented x 3.  Psych: Normal affect. Extremities:  No clubbing or cyanosis.  HEENT: Normal.   Assessment/Plan: 1. S/p mitral valve repair:  Echo in 8/22 showed mild-moderate stenosis with mean gradient 8 mmHg and mild MR.  TEE in 8/23 showed MV repair with mean gradient 10 mmHg but MVA 2.19 cm^2 (leaflets open well), trivial MR.  There is likely a degree of mitral stenosis from a relatively tight repair.  I do not think this is clinically significant.  - With prosthetic material, should use antibiotic prophylaxis with dental work.  - I will arrange for repeat echo to reassess mitral valve.  2. CAD:  He has severe stenosis in a distal D1 that is not amenable to intervention.  This has been stable on last couple of caths. He has occluded RCA and LCx known from the past with SVGs to OM and PDA.  Echo in 2/22 with EF 45-50%. Cardiolite in 11/19 showed no ischemia. Echo in 8/22 with EF up to 55-60%, TEE in 8/23  with EF 55%.  No recent chest pain.   - Currently not on ASA, taking apixaban.  - He cannot tolerate statins with myalgias, Now on Praluent.    3. Hyperlipidemia: Myalgias with Crestor, pravastatin, and atorvastatin.  We were finally able to get him Praluent through the [place]. He is also on Zetia.  - Check lipids today.    4. Chronic primarily diastolic CHF:  TEE in 8/23 with EF 55%, mild RV enlargement with normal RV function.  CHF is worsened by CKD stage IV. NYHA class III symptoms, worse for about a month.  He is volume overloaded on exam.  Suspect worsening of symptoms has been triggered by atrial fibrillation.   - Increase Lasix to 80 qam/40 qpm.  BMET/BNP today, BMET in 10 days.  5. OSA: Continue CPAP.  6. Atrial fibrillation: Last DCCV in 8/23.  Now appears to have been in atrial fibrillation for at least a month with worsening of CHF.  He is  on amiodarone. He has not missed any apixaban doses in the last month or so.  - Continue apixaban 5 mg bid.  - Continue amiodarone 200 mg daily. Check LFTs, TSH today.  He will need a regular eye exam.  - I will arrange for DCCV, should not need TEE. Discussed risks/benefits and he agrees to procedure.  - He will be seeing Dr. Nelly Laurence, would like consideration for atrial fibrillation ablation.  7. CKD stage 4: AKI without recovery, uncertain etiology.  He sees nephrology, there is concern that he is progressing towards HD. He now has AV fistula. Recent creatinine 4.22.   - BMET today.  8. Diabetes: He is now followed by endocrinology. 9. Atrial tachycardia: Paroxysmal.  Seen by Dr. Johney Frame, medical management planned.  Continue amiodarone.  10. CVA: Though to be atrial fibrillation-related.  Carotid dopplers 6/22 with mild stenosis only. However, he was also noted on 8/22 echo to have a PFO.  - Continue on Eliquis.  11. Enterococcal bacteremia: In 7/22.  On TEE, it was hard to differentiate between loose suture material and small vegetation.  He was  treated for 6 wks with IV abx.   Followup in 3 wks with APP.   Anderson [place] [facility] 03/08/2023

## 2023-03-09 ENCOUNTER — Encounter (HOSPITAL_COMMUNITY): Payer: Self-pay

## 2023-03-09 ENCOUNTER — Ambulatory Visit (HOSPITAL_COMMUNITY)
Admission: RE | Admit: 2023-03-09 | Discharge: 2023-03-09 | Disposition: A | Discharge status: Home / Self Care | Payer: No Typology Code available for payment source | Source: Ambulatory Visit | Attending: Family Medicine | Admitting: Family Medicine

## 2023-03-09 VITALS — BP 134/78 | HR 73 | Wt 194.6 lb

## 2023-03-09 DIAGNOSIS — G4733 Obstructive sleep apnea (adult) (pediatric): Secondary | ICD-10-CM | POA: Diagnosis not present

## 2023-03-09 DIAGNOSIS — Z8673 Personal history of transient ischemic attack (TIA), and cerebral infarction without residual deficits: Secondary | ICD-10-CM | POA: Insufficient documentation

## 2023-03-09 DIAGNOSIS — Z79899 Other long term (current) drug therapy: Secondary | ICD-10-CM | POA: Diagnosis not present

## 2023-03-09 DIAGNOSIS — M791 Myalgia, unspecified site: Secondary | ICD-10-CM | POA: Insufficient documentation

## 2023-03-09 DIAGNOSIS — E785 Hyperlipidemia, unspecified: Secondary | ICD-10-CM | POA: Diagnosis not present

## 2023-03-09 DIAGNOSIS — I2510 Atherosclerotic heart disease of native coronary artery without angina pectoris: Secondary | ICD-10-CM | POA: Diagnosis not present

## 2023-03-09 DIAGNOSIS — I4719 Other supraventricular tachycardia: Secondary | ICD-10-CM | POA: Diagnosis not present

## 2023-03-09 DIAGNOSIS — I4891 Unspecified atrial fibrillation: Secondary | ICD-10-CM | POA: Diagnosis not present

## 2023-03-09 DIAGNOSIS — I4819 Other persistent atrial fibrillation: Secondary | ICD-10-CM | POA: Diagnosis not present

## 2023-03-09 DIAGNOSIS — I130 Hypertensive heart and chronic kidney disease with heart failure and stage 1 through stage 4 chronic kidney disease, or unspecified chronic kidney disease: Secondary | ICD-10-CM | POA: Insufficient documentation

## 2023-03-09 DIAGNOSIS — Z823 Family history of stroke: Secondary | ICD-10-CM | POA: Diagnosis not present

## 2023-03-09 DIAGNOSIS — I5032 Chronic diastolic (congestive) heart failure: Secondary | ICD-10-CM | POA: Insufficient documentation

## 2023-03-09 DIAGNOSIS — Z9889 Other specified postprocedural states: Secondary | ICD-10-CM

## 2023-03-09 DIAGNOSIS — I509 Heart failure, unspecified: Secondary | ICD-10-CM

## 2023-03-09 DIAGNOSIS — I639 Cerebral infarction, unspecified: Secondary | ICD-10-CM | POA: Diagnosis not present

## 2023-03-09 DIAGNOSIS — N184 Chronic kidney disease, stage 4 (severe): Secondary | ICD-10-CM | POA: Diagnosis not present

## 2023-03-09 DIAGNOSIS — Z794 Long term (current) use of insulin: Secondary | ICD-10-CM

## 2023-03-09 DIAGNOSIS — I38 Endocarditis, valve unspecified: Secondary | ICD-10-CM | POA: Diagnosis not present

## 2023-03-09 DIAGNOSIS — I052 Rheumatic mitral stenosis with insufficiency: Secondary | ICD-10-CM | POA: Diagnosis not present

## 2023-03-09 DIAGNOSIS — Z8774 Personal history of (corrected) congenital malformations of heart and circulatory system: Secondary | ICD-10-CM | POA: Insufficient documentation

## 2023-03-09 DIAGNOSIS — Z7901 Long term (current) use of anticoagulants: Secondary | ICD-10-CM | POA: Insufficient documentation

## 2023-03-09 DIAGNOSIS — E1122 Type 2 diabetes mellitus with diabetic chronic kidney disease: Secondary | ICD-10-CM | POA: Insufficient documentation

## 2023-03-09 DIAGNOSIS — E782 Mixed hyperlipidemia: Secondary | ICD-10-CM

## 2023-03-09 LAB — BASIC METABOLIC PANEL
Anion gap: 11 (ref 5–15)
BUN: 46 mg/dL — ABNORMAL HIGH (ref 8–23)
CO2: 26 mmol/L (ref 22–32)
Calcium: 10 mg/dL (ref 8.9–10.3)
Chloride: 100 mmol/L (ref 98–111)
Creatinine, Ser: 4.42 mg/dL — ABNORMAL HIGH (ref 0.61–1.24)
GFR, Estimated: 13 mL/min — ABNORMAL LOW (ref >60)
Glucose, Bld: 135 mg/dL — ABNORMAL HIGH (ref 70–99)
Potassium: 4 mmol/L (ref 3.5–5.1)
Sodium: 137 mmol/L (ref 135–145)

## 2023-03-09 LAB — CBC
HCT: 38.5 % — ABNORMAL LOW (ref 39.0–52.0)
Hemoglobin: 13 g/dL (ref 13.0–17.0)
MCH: 32.2 pg (ref 26.0–34.0)
MCHC: 33.8 g/dL (ref 30.0–36.0)
MCV: 95.3 fL (ref 80.0–100.0)
Platelets: 192 10*3/uL (ref 150–400)
RBC: 4.04 MIL/uL — ABNORMAL LOW (ref 4.22–5.81)
RDW: 13.1 % (ref 11.5–15.5)
WBC: 10.6 10*3/uL — ABNORMAL HIGH (ref 4.0–10.5)
nRBC: 0 % (ref 0.0–0.2)

## 2023-03-09 LAB — BRAIN NATRIURETIC PEPTIDE: B Natriuretic Peptide: 197.5 pg/mL — ABNORMAL HIGH (ref 0.0–100.0)

## 2023-03-09 NOTE — Patient Instructions (Addendum)
Thank you for coming in today   You are scheduled for a  Cardioversion on 03/12/2023 with Dr. Shirlee Latch.  Please arrive at the [facility] (Main Entrance A) at [facility]: [address] [place], [place] [address] at 1230 pm. (1 hour prior to procedure unless lab work is needed; if lab work is needed arrive 1.5 hours ahead)  DIET: Nothing to eat or drink after midnight except a sip of water with medications (see medication instructions below)  Medication Instructions:   Continue your anticoagulant:Eliquis You will need to continue your anticoagulant after your procedure until you  are told by your  Provider that it is safe to stop    You must have a responsible person to drive you home and stay in the waiting area during your procedure. Failure to do so could result in cancellation.  Bring your insurance cards.  *Special Note: Every effort is made to have your procedure done on time. Occasionally there are emergencies that occur at the hospital that may cause delays. Please be patient if a delay does occur.    If you had labs drawn today, any labs that are abnormal the clinic will call you No news is good news  Medications: N changes  Follow up appointments:  Your physician recommends that you schedule a follow-up appointment in:  6 months with Dr. Earlean Shawl will receive a reminder letter in the mail a few months in advance. If you don't receive a letter, please call our office to schedule the follow-up appointment.    Do the following things EVERYDAY: Weigh yourself in the morning before breakfast. Write it down and keep it in a log. Take your medicines as prescribed Eat low salt foods--Limit salt (sodium) to 2000 mg per day.  Stay as active as you can everyday Limit all fluids for the day to less than 2 liters   At the [facility], you and your health needs are our priority. As part of our continuing mission to provide you with exceptional heart  care, we have created designated Provider Care Teams. These Care Teams include your primary Cardiologist (physician) and Advanced Practice Providers (APPs- Physician Assistants and Nurse Practitioners) who all work together to provide you with the care you need, when you need it.   You may see any of the following providers on your designated Care Team at your next follow up: Dr Arvilla Meres Dr Marca Ancona Dr. Marcos Eke, NP Robbie Lis, [place] [facility] [place], [place] Brynda Peon, NP Karle Plumber, PharmD   Please be sure to bring in all your medications bottles to every appointment.    Thank you for choosing [facility]  If you have any questions or concerns before your next appointment please send [place] a message through [place] or call our office at [phone].    TO LEAVE A MESSAGE FOR THE NURSE SELECT OPTION 2, PLEASE LEAVE A MESSAGE INCLUDING: YOUR NAME DATE OF BIRTH CALL BACK NUMBER REASON FOR CALL**this is important as we prioritize the call backs  YOU WILL RECEIVE A CALL BACK THE SAME DAY AS LONG AS YOU CALL BEFORE 4:00 PM

## 2023-03-09 NOTE — Progress Notes (Signed)
ReDS Vest / Clip - 03/09/23 1500       ReDS Vest / Clip   Station Marker D    Ruler Value 37    ReDS Value Range Low volume    ReDS Actual Value 33

## 2023-03-10 DIAGNOSIS — L570 Actinic keratosis: Secondary | ICD-10-CM | POA: Diagnosis not present

## 2023-03-10 DIAGNOSIS — Z85828 Personal history of other malignant neoplasm of skin: Secondary | ICD-10-CM | POA: Diagnosis not present

## 2023-03-10 DIAGNOSIS — D692 Other nonthrombocytopenic purpura: Secondary | ICD-10-CM | POA: Diagnosis not present

## 2023-03-10 DIAGNOSIS — L821 Other seborrheic keratosis: Secondary | ICD-10-CM | POA: Diagnosis not present

## 2023-03-11 NOTE — Progress Notes (Signed)
  Arrive at hospital at 1145. Patient called with pre-procedure instructions for tomorrow NPO after midnight Take morning medications w/sip of water: Take blood thinners and BP medication w/sip of water.  Hold [place], [place], [place], etc. For one week.  Confirmed no breaks in taking blood thinner for three weeks Needs a ride home and have a responsible adult to stay w/him or her for 24 hours No lotion day of procedure No jewelry

## 2023-03-12 ENCOUNTER — Other Ambulatory Visit: Payer: Self-pay

## 2023-03-12 ENCOUNTER — Ambulatory Visit (HOSPITAL_COMMUNITY): Payer: No Typology Code available for payment source | Admitting: Registered Nurse

## 2023-03-12 ENCOUNTER — Ambulatory Visit (HOSPITAL_BASED_OUTPATIENT_CLINIC_OR_DEPARTMENT_OTHER): Payer: No Typology Code available for payment source | Admitting: Registered Nurse

## 2023-03-12 ENCOUNTER — Ambulatory Visit (HOSPITAL_COMMUNITY)
Admission: RE | Admit: 2023-03-12 | Discharge: 2023-03-12 | Disposition: A | Discharge status: Home / Self Care | Payer: No Typology Code available for payment source | Attending: Cardiology | Admitting: Cardiology

## 2023-03-12 ENCOUNTER — Encounter (HOSPITAL_COMMUNITY): Payer: Self-pay | Admitting: Cardiology

## 2023-03-12 ENCOUNTER — Encounter (HOSPITAL_COMMUNITY)
Admission: RE | Disposition: A | Discharge status: Home / Self Care | Payer: Self-pay | Source: Home / Self Care | Attending: Cardiology

## 2023-03-12 DIAGNOSIS — G4733 Obstructive sleep apnea (adult) (pediatric): Secondary | ICD-10-CM | POA: Diagnosis not present

## 2023-03-12 DIAGNOSIS — I2510 Atherosclerotic heart disease of native coronary artery without angina pectoris: Secondary | ICD-10-CM | POA: Diagnosis not present

## 2023-03-12 DIAGNOSIS — N184 Chronic kidney disease, stage 4 (severe): Secondary | ICD-10-CM | POA: Insufficient documentation

## 2023-03-12 DIAGNOSIS — I130 Hypertensive heart and chronic kidney disease with heart failure and stage 1 through stage 4 chronic kidney disease, or unspecified chronic kidney disease: Secondary | ICD-10-CM

## 2023-03-12 DIAGNOSIS — Z7901 Long term (current) use of anticoagulants: Secondary | ICD-10-CM | POA: Diagnosis not present

## 2023-03-12 DIAGNOSIS — I4719 Other supraventricular tachycardia: Secondary | ICD-10-CM | POA: Insufficient documentation

## 2023-03-12 DIAGNOSIS — E1122 Type 2 diabetes mellitus with diabetic chronic kidney disease: Secondary | ICD-10-CM | POA: Diagnosis not present

## 2023-03-12 DIAGNOSIS — I4891 Unspecified atrial fibrillation: Secondary | ICD-10-CM | POA: Diagnosis not present

## 2023-03-12 DIAGNOSIS — Z951 Presence of aortocoronary bypass graft: Secondary | ICD-10-CM | POA: Insufficient documentation

## 2023-03-12 DIAGNOSIS — E785 Hyperlipidemia, unspecified: Secondary | ICD-10-CM | POA: Diagnosis not present

## 2023-03-12 DIAGNOSIS — Z79899 Other long term (current) drug therapy: Secondary | ICD-10-CM | POA: Diagnosis not present

## 2023-03-12 DIAGNOSIS — Z6827 Body mass index (BMI) 27.0-27.9, adult: Secondary | ICD-10-CM | POA: Insufficient documentation

## 2023-03-12 DIAGNOSIS — I5032 Chronic diastolic (congestive) heart failure: Secondary | ICD-10-CM | POA: Diagnosis not present

## 2023-03-12 DIAGNOSIS — I25119 Atherosclerotic heart disease of native coronary artery with unspecified angina pectoris: Secondary | ICD-10-CM | POA: Diagnosis not present

## 2023-03-12 DIAGNOSIS — E669 Obesity, unspecified: Secondary | ICD-10-CM | POA: Insufficient documentation

## 2023-03-12 DIAGNOSIS — Z8673 Personal history of transient ischemic attack (TIA), and cerebral infarction without residual deficits: Secondary | ICD-10-CM | POA: Insufficient documentation

## 2023-03-12 DIAGNOSIS — Z87891 Personal history of nicotine dependence: Secondary | ICD-10-CM

## 2023-03-12 HISTORY — PX: CARDIOVERSION: SHX1299

## 2023-03-12 LAB — GLUCOSE, CAPILLARY: Glucose-Capillary: 150 mg/dL — ABNORMAL HIGH (ref 70–99)

## 2023-03-12 SURGERY — CARDIOVERSION
Anesthesia: General

## 2023-03-12 MED ORDER — PROPOFOL 10 MG/ML IV BOLUS
INTRAVENOUS | Status: DC | PRN
  Administered 2023-03-12: 20 mg via INTRAVENOUS
  Administered 2023-03-12: 30 mg via INTRAVENOUS

## 2023-03-12 MED ORDER — SODIUM CHLORIDE 0.9 % IV SOLN: INTRAVENOUS | Status: DC

## 2023-03-12 SURGICAL SUPPLY — 1 items: ELECT DEFIB PAD ADLT CADENCE (PAD) ×1 IMPLANT

## 2023-03-12 NOTE — Anesthesia Procedure Notes (Signed)
Procedure Name: MAC Date/Time: 03/12/2023 1:47 PM  Performed by: Val Eagle, MDPre-anesthesia Checklist: Patient identified, Emergency Drugs available, Suction available and Patient being monitored Patient Re-evaluated:Patient Re-evaluated prior to induction Oxygen Delivery Method: Circle system utilized Preoxygenation: Pre-oxygenation with 100% oxygen Induction Type: IV induction

## 2023-03-12 NOTE — Interval H&P Note (Signed)
History and Physical Interval Note:  03/12/2023 1:30 PM  [name]  has presented today for surgery, with the diagnosis of AFIB.  The various methods of treatment have been discussed with the patient and family. After consideration of risks, benefits and other options for treatment, the patient has consented to  Procedure(s): CARDIOVERSION (N/A) as a surgical intervention.  The patient's history has been reviewed, patient examined, no change in status, stable for surgery.  I have reviewed the patient's chart and labs.  Questions were answered to the patient's satisfaction.     [name] [employer]

## 2023-03-12 NOTE — Anesthesia Preprocedure Evaluation (Signed)
Anesthesia Evaluation   Patient awake    Reviewed: Allergy & Precautions, NPO status , Patient's Chart, lab work & pertinent test results  History of Anesthesia Complications Negative for: history of anesthetic complications  Airway Mallampati: III  TM Distance: >3 FB Neck ROM: Full    Dental  (+) Teeth Intact, Dental Advisory Given   Pulmonary shortness of breath, sleep apnea and Continuous Positive Airway Pressure Ventilation , former smoker   breath sounds clear to auscultation       Cardiovascular hypertension, + angina  + CAD and + Past MI   Rhythm:Irregular   1. Left ventricular ejection fraction, by estimation, is 55%. The left  ventricle has normal function. The left ventricle has no regional wall  motion abnormalities.   2. Right ventricular systolic function is normal. The right ventricular  size is mildly enlarged. Tricuspid regurgitation signal is inadequate for  assessing PA pressure.   3. Left atrial size was moderately dilated. No left atrial/left atrial  appendage thrombus was detected.   4. Right atrial size was moderately dilated.   5. Status post mitral valve repair. Mean gradient 10 mmHg across the  mitral valve, MVA 2.19 cm^2. The leaflets open well, the valve looks like  it was repaired tightly. Trivial regurgitation.   6. The aortic valve is tricuspid. There is mild calcification of the  aortic valve. Aortic valve regurgitation is not visualized. No aortic  stenosis is present.   7. PFO noted by color doppler.     Neuro/Psych  PSYCHIATRIC DISORDERS Anxiety Depression    Right leg weakness  Neuromuscular disease CVA, Residual Symptoms    GI/Hepatic Neg liver ROS, PUD,GERD  ,,  Endo/Other  diabetes  Lab Results      Component                Value               Date                      HGBA1C                   6.8 (A)             01/11/2023             Renal/GU Renal diseaseLab Results       Component                Value               Date                      CREATININE               4.42 (H)            03/09/2023            Lab Results      Component                Value               Date                      K                        4.0  03/09/2023                Musculoskeletal   Abdominal   Peds  Hematology  (+) Blood dyscrasia, anemia Lab Results      Component                Value               Date                      WBC                      10.6 (H)            03/09/2023                HGB                      13.0                03/09/2023                HCT                      38.5 (L)            03/09/2023                MCV                      95.3                03/09/2023                PLT                      192                 03/09/2023              Anesthesia Other Findings   Reproductive/Obstetrics                             Anesthesia Physical Anesthesia Plan  ASA: 3  Anesthesia Plan: General   Post-op Pain Management: Minimal or no pain anticipated   Induction: Intravenous  PONV Risk Score and Plan: 2 and Treatment may vary due to age or medical condition  Airway Management Planned: Nasal Cannula, Natural Airway and Simple Face Mask  Additional Equipment:   Intra-op Plan:   Post-operative Plan:   Informed Consent: I have reviewed the patients History and Physical, chart, labs and discussed the procedure including the risks, benefits and alternatives for the proposed anesthesia with the patient or authorized representative who has indicated his/her understanding and acceptance.     Dental advisory given  Plan Discussed with: CRNA  Anesthesia Plan Comments:        Anesthesia Quick Evaluation

## 2023-03-12 NOTE — Procedures (Signed)
Electrical Cardioversion Procedure Note KENDRYCK LACROIX [id number] 1946-05-22  Procedure: Electrical Cardioversion Indications:  Atrial Fibrillation  Procedure Details Consent: Risks of procedure as well as the alternatives and risks of each were explained to the (patient/caregiver).  Consent for procedure obtained. Time Out: Verified patient identification, verified procedure, site/side was marked, verified correct patient position, special equipment/implants available, medications/allergies/relevent history reviewed, required imaging and test results available.  Performed  Patient placed on cardiac monitor, pulse oximetry, supplemental oxygen as necessary.  Sedation given:  Propofol per anesthesiology Pacer pads placed anterior and posterior chest.  Cardioverted 1 time(s).  Cardioverted at 200J.  Evaluation Findings: Post procedure EKG shows: NSR Complications: None Patient did tolerate procedure well.   Marca Ancona 03/12/2023, 1:53 PM

## 2023-03-12 NOTE — Transfer of Care (Signed)
Immediate Anesthesia Transfer of Care Note  Patient: [name]  Procedure(s) Performed: CARDIOVERSION  Patient Location: Cath Lab  Anesthesia Type:General  Level of Consciousness: awake, alert , and oriented  Airway & Oxygen Therapy: Patient Spontanous Breathing and Patient connected to nasal cannula oxygen  Post-op Assessment: Report given to RN, Post -op Vital signs reviewed and stable, and Patient moving all extremities X 4  Post vital signs: Reviewed and stable  Last Vitals:  Vitals Value Taken Time  BP    Temp    Pulse    Resp    SpO2      Last Pain:  Vitals:   03/12/23 1357  TempSrc: Temporal  PainSc: Asleep         Complications: No notable events documented.

## 2023-03-12 NOTE — Anesthesia Postprocedure Evaluation (Signed)
Anesthesia Post Note  Patient: BASILIO MEADOW  Procedure(s) Performed: CARDIOVERSION     Patient location during evaluation: PACU Anesthesia Type: General Level of consciousness: awake and alert, oriented and patient cooperative Pain management: pain level controlled Vital Signs Assessment: post-procedure vital signs reviewed and stable Respiratory status: spontaneous breathing, nonlabored ventilation and respiratory function stable Cardiovascular status: blood pressure returned to baseline and stable Postop Assessment: no apparent nausea or vomiting Anesthetic complications: no   No notable events documented.  Last Vitals:  Vitals:   03/12/23 1357 03/12/23 1358  BP:  110/66  Pulse:  65  Resp:  14  Temp: 36.8 C   SpO2:  98%    Last Pain:  Vitals:   03/12/23 1357  TempSrc: Temporal  PainSc: Asleep   Pain Goal:                   Lannie Fields

## 2023-03-15 ENCOUNTER — Encounter (HOSPITAL_COMMUNITY): Payer: Self-pay | Admitting: Cardiology

## 2023-03-22 ENCOUNTER — Ambulatory Visit (HOSPITAL_COMMUNITY)
Admission: RE | Admit: 2023-03-22 | Discharge: 2023-03-22 | Disposition: A | Discharge status: Home / Self Care | Payer: No Typology Code available for payment source | Source: Ambulatory Visit | Attending: Cardiology | Admitting: Cardiology

## 2023-03-22 DIAGNOSIS — I110 Hypertensive heart disease with heart failure: Secondary | ICD-10-CM | POA: Insufficient documentation

## 2023-03-22 DIAGNOSIS — I5032 Chronic diastolic (congestive) heart failure: Secondary | ICD-10-CM | POA: Insufficient documentation

## 2023-03-22 DIAGNOSIS — E785 Hyperlipidemia, unspecified: Secondary | ICD-10-CM | POA: Diagnosis not present

## 2023-03-22 DIAGNOSIS — I081 Rheumatic disorders of both mitral and tricuspid valves: Secondary | ICD-10-CM | POA: Insufficient documentation

## 2023-03-22 DIAGNOSIS — G4730 Sleep apnea, unspecified: Secondary | ICD-10-CM | POA: Insufficient documentation

## 2023-03-22 DIAGNOSIS — I77819 Aortic ectasia, unspecified site: Secondary | ICD-10-CM | POA: Diagnosis not present

## 2023-03-22 DIAGNOSIS — E119 Type 2 diabetes mellitus without complications: Secondary | ICD-10-CM | POA: Diagnosis not present

## 2023-03-22 LAB — ECHOCARDIOGRAM COMPLETE
Area-P 1/2: 2.01 cm2
Calc EF: 55.1 %
MV M vel: 4.54 m/s
MV Peak grad: 82.4 mmHg
MV VTI: 1.24 cm2
Radius: 0.47 cm
S' Lateral: 3.2 cm
Single Plane A2C EF: 54.3 %
Single Plane A4C EF: 54.8 %

## 2023-03-22 NOTE — Progress Notes (Signed)
Echocardiogram 2D Echocardiogram has been performed.  [name] 03/22/2023, 2:55 PM

## 2023-03-25 ENCOUNTER — Telehealth (HOSPITAL_COMMUNITY): Payer: Self-pay

## 2023-03-25 NOTE — Telephone Encounter (Addendum)
  Pt aware, agreeable, and verbalized understanding Follow up scheduled   ----- Message from Marca Ancona sent at 03/22/2023  6:12 PM EDT ----- Status post MV repair with mild mitral stenosis.  Normal LV EF.  IVC dilated, suspect volume overloaded.  Please make sure he has office followup in the next couple of weeks, can be with APP.

## 2023-03-30 ENCOUNTER — Ambulatory Visit (INDEPENDENT_AMBULATORY_CARE_PROVIDER_SITE_OTHER): Payer: Medicare Other | Admitting: Diagnostic Neuroimaging

## 2023-03-30 ENCOUNTER — Encounter: Payer: Self-pay | Admitting: Diagnostic Neuroimaging

## 2023-03-30 VITALS — BP 118/70 | HR 53 | Ht 70.0 in | Wt 192.2 lb

## 2023-03-30 DIAGNOSIS — R29898 Other symptoms and signs involving the musculoskeletal system: Secondary | ICD-10-CM | POA: Diagnosis not present

## 2023-03-30 DIAGNOSIS — R269 Unspecified abnormalities of gait and mobility: Secondary | ICD-10-CM

## 2023-03-30 NOTE — Progress Notes (Signed)
[facility]  PATIENT: [name] DOB: Jul 07, 1946  REFERRING CLINICIAN: Daisy Floro, MD HISTORY FROM: PATIENT REASON FOR VISIT: follow up   HISTORICAL  CHIEF COMPLAINT:  Chief Complaint  Patient presents with   Follow-up    Rm 7. Accompanied by wife. He c/o dizziness and bilateral leg and foot tingling. He is not interested in taking gabapentin due to possible contraindications. He has kidney, heart failure, and diabetes.     HISTORY OF PRESENT ILLNESS:   UPDATE (03/30/23, VRP): Since last visit, continues with gait imbalance since 2022. Mainly affects balance when he stands up and turns quickly. Some numbness in feet continues.  UPDATE (04/01/2021, VRP): Since last visit, was hospitalized in June 2022 for right-sided weakness affecting the right arm and leg.  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  She was hospitalized again in July 2022 for hematoma and then bacteremia.  Patient referred here for evaluation of follow-up of right foot drop which developed after stroke in June 2022.  Right foot drop weakness is slightly improving with physical therapy.  He is having significantly excessive bleeding since aspirin was added to Eliquis in June 2022.  Patient and family are requesting to stop aspirin.  PRIOR HPI (11/06/20): 77 year old male here evaluation of left-sided weakness, numbness, gait difficulty.  Patient presented to hospital on 10/04/2020 for ataxia, numbness, weakness, shortness of breath and dyspnea on exertion.  Patient was admitted for evaluation.  Cardiac and neurology evaluation were completed.  MRI of the brain was negative for acute stroke.  Patient was already on Eliquis and statin.  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  Since that time patient has some issues with veering towards the left side, especially when he wakes up in the middle the night to use the bathroom.  During the daytime he  feels better.  Sometimes if he sleeps with his arms folded across his chest he feels some numbness in his hands.  Symptoms improved if he straightens his arms out.   REVIEW OF SYSTEMS: Full 14 system review of systems performed and negative with exception of: as per HPI.  ALLERGIES: Allergies  Allergen Reactions   Crestor [Rosuvastatin] Other (See Comments)    Muscle aches    Lipitor [Atorvastatin] Other (See Comments)    Muscle aches   Pravastatin Other (See Comments)    Muscle aches   Carvedilol Other (See Comments)    loss of appetite   Metformin Diarrhea   Serotonin Reuptake Inhibitors (Ssris) Other (See Comments)    Muscle aches   Zocor [Simvastatin] Other (See Comments)     Muscle pain   Codeine Itching and Other (See Comments)    Extremity tingling-- can take synthetic    HOME MEDICATIONS: Outpatient Medications Prior to Visit  Medication Sig Dispense Refill   albuterol (VENTOLIN HFA) 108 (90 Base) MCG/ACT inhaler Inhale 1-2 puffs into the lungs every 6 (six) hours as needed for shortness of breath or wheezing.     Alirocumab (PRALUENT) 75 MG/ML SOAJ Inject 75 mg into the skin every 14 (fourteen) days.     allopurinol (ZYLOPRIM) 100 MG tablet Take 50 mg by mouth 2 (two) times a week.     amiodarone (PACERONE) 200 MG tablet Take 1 tablet (200 mg total) by mouth in the morning.     apixaban (ELIQUIS) 5 MG TABS tablet Take 1 tablet (5 mg total) by mouth 2 (two) times daily. 180 tablet 3  busPIRone (BUSPAR) 10 MG tablet Take 5 mg by mouth 2 (two) times daily.     cetirizine (ZYRTEC) 10 MG tablet Take 10 mg by mouth in the morning.     colchicine 0.6 MG tablet Take 0.6-1.2 mg by mouth 2 (two) times daily as needed (gout).      Cyanocobalamin (VITAMIN B-12) 5000 MCG TBDP Take 5,000 mcg by mouth 2 (two) times a week.     cycloSPORINE (RESTASIS) 0.05 % ophthalmic emulsion Place 1 drop into both eyes 2 (two) times daily as needed (eye irritation.).     diclofenac Sodium  (VOLTAREN) 1 % GEL Apply 2 g topically 3 (three) times daily as needed for pain.     docusate sodium (COLACE) 100 MG capsule Take 200 mg by mouth at bedtime.     ezetimibe (ZETIA) 10 MG tablet Take 1 tablet (10 mg total) by mouth daily. 90 tablet 3   ferrous sulfate 325 (65 FE) MG tablet Take 325 mg by mouth every other day.     fluticasone (FLONASE) 50 MCG/ACT nasal spray Place 1 spray into both nostrils daily as needed for allergies or rhinitis.     furosemide (LASIX) 40 MG tablet Take 2 tablets (80 mg total) by mouth every morning AND 1 tablet (40 mg total) every evening. 254 tablet 3   HYDROcodone-acetaminophen (NORCO) 10-325 MG tablet Take 1 tablet by mouth 3 (three) times daily as needed for moderate pain.     Meclizine HCl 25 MG CHEW Chew 25 mg by mouth 3 (three) times daily as needed (dizziness).     methocarbamol (ROBAXIN) 500 MG tablet Take 500 mg by mouth 3 (three) times daily as needed for muscle spasms.     Multiple Vitamin (MULTIVITAMIN) capsule Take 1 capsule by mouth in the morning.     nitroGLYCERIN (NITROSTAT) 0.4 MG SL tablet 1 TAB UNDER THE TONGUE EVERY 5 MINUTES AS NEEDED FOR CHEST PAIN UP TO 3 DOSES, IF PERSIST CALL 911 25 tablet 3   ONETOUCH VERIO test strip 1 each daily.     pantoprazole (PROTONIX) 40 MG tablet Take 40 mg by mouth daily as needed (heartburn).     potassium chloride SA (KLOR-CON M) 20 MEQ tablet Take 20 mEq by mouth daily.     Semaglutide,0.25 or 0.5MG /DOS, 2 MG/3ML SOPN Inject 0.5 mg into the skin once a week. 9 mL 3   tadalafil (CIALIS) 20 MG tablet Take 20 mg by mouth daily as needed for erectile dysfunction.     testosterone cypionate (DEPOTESTOSTERONE CYPIONATE) 200 MG/ML injection Inject 200 mg into the muscle once a week.     traZODone (DESYREL) 100 MG tablet Take 100 mg by mouth at bedtime.     No facility-administered medications prior to visit.    PAST MEDICAL HISTORY: Past Medical History:  Diagnosis Date   Allergic rhinitis    Anxiety     Atrial fibrillation (HCC)    Cancer (HCC)    hx of skin cancers    Chronic kidney disease    Coronary artery disease    a. s/p MI & CABG; b. s/p PCI Diag;  c. Cath 12/2011: LAD diff dzs/small, Diag 75% isr, 90 dist to stent (small), LCX & RCA occluded, VG->OM 40, VG->PDA patent - med rx.   COVID-19    x 3   Depression    PTSD   Diabetes mellitus    not on medications   Diastolic CHF, chronic (HCC)    a. EF 55-60% by  echo 2007   GERD (gastroesophageal reflux disease)    "not anymore" " I had a lap band"   History of diverticulitis of colon    5 YRS AGO   History of kidney stones    History of pneumonia    2017   History of transient ischemic attack (TIA)    CAROTID DOPPLERS NOV 2011  0-39& BIL. STENOSIS   Hyperlipidemia    Hypertension    Kidney failure    Mitral regurgitation    Myocardial infarction [facility])    Neuromuscular disorder (HCC)    left arm numbness    OA (osteoarthritis)    RIGHT KNEE ARTHOFIBROSIS W/ PAIN  (S/P  REPLACEMENT 2004)   Pneumonia    PTSD (post-traumatic stress disorder)    from [place] since 1973   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/25/2016   Complex valvuloplasty including artificial Gore-tex neochord placement x4, plication of anterior commissure, and 26 mm Sorin Memo 3D ring annuloplasty via right mini thoracotomy approach   Shortness of breath dyspnea    with exertion   Skin cancer    Sleep apnea    cpap- see ov note in EPIC 05/12/11 for settings    Stroke (HCC)    hx of   Stroke [facility])     PAST SURGICAL HISTORY: Past Surgical History:  Procedure Laterality Date   AV FISTULA PLACEMENT Left 08/02/2018   Procedure: ARTERIOVENOUS (AV) FISTULA CREATION RADIOCEPHALIC;  Surgeon: Chuck Hint, MD;  Location: [facility] OR;  Service: Vascular;  Laterality: Left;   BLEPHAROPLASTY  1985   BILATERAL   CARDIAC CATHETERIZATION  2001, 2004, 2009, 2011   CARDIAC CATHETERIZATION N/A 01/23/2016   Procedure: Right/Left Heart Cath and Coronary  Angiography;  Surgeon: Laurey Morale, MD;  Location: [facility] INVASIVE CV LAB;  Service: Cardiovascular;  Laterality: N/A;   CARDIOVERSION N/A 09/07/2017   Procedure: CARDIOVERSION;  Surgeon: Laurey Morale, MD;  Location: [facility] ENDOSCOPY;  Service: Cardiovascular;  Laterality: N/A;   CARDIOVERSION N/A 09/13/2019   Procedure: CARDIOVERSION;  Surgeon: Laurey Morale, MD;  Location: [facility] ENDOSCOPY;  Service: Cardiovascular;  Laterality: N/A;   CARDIOVERSION N/A 10/19/2019   Procedure: CARDIOVERSION;  Surgeon: Laurey Morale, MD;  Location: [facility] ENDOSCOPY;  Service: Cardiovascular;  Laterality: N/A;   CARDIOVERSION N/A 04/14/2022   Procedure: CARDIOVERSION;  Surgeon: Laurey Morale, MD;  Location: [facility] ENDOSCOPY;  Service: Cardiovascular;  Laterality: N/A;   CARDIOVERSION N/A 02/22/2023   Procedure: CARDIOVERSION;  Surgeon: Laurey Morale, MD;  Location: [facility] INVASIVE CV LAB;  Service: Cardiovascular;  Laterality: N/A;   CARDIOVERSION N/A 03/12/2023   Procedure: CARDIOVERSION;  Surgeon: Laurey Morale, MD;  Location: [facility] INVASIVE CV LAB;  Service: Cardiovascular;  Laterality: N/A;   CATARACT EXTRACTION W/ INTRAOCULAR LENS IMPLANT Bilateral    CHONDROPLASTY  07/22/2011   Procedure: CHONDROPLASTY;  Surgeon: Loanne Drilling;  Location: [facility];  Service: Orthopedics;;   CIRCUMCISION  35 YRS  AGO   COLONOSCOPY     CORONARY ANGIOPLASTY  1996-- POST CABG   W/ STENT, last cath 01/07/2012    CORONARY ARTERY BYPASS GRAFT  1995   X3 VESSEL   CORONARY ARTERY BYPASS GRAFT  1995   CORONARY STENT PLACEMENT     CYSTOSCOPY W/ URETERAL STENT PLACEMENT Right 04/11/2022   Procedure: CYSTOSCOPY WITH RETROGRADE PYELOGRAM/URETERAL STENT PLACEMENT;  Surgeon: Malen Gauze, MD;  Location: [facility] ORS;  Service: Urology;  Laterality: Right;   CYSTOSCOPY/URETEROSCOPY/HOLMIUM LASER/STENT PLACEMENT Right 01/08/2021  Procedure: CYSTOSCOPY/RETROGRADE/URETEROSCOPY/HOLMIUM LASER/STENT PLACEMENT;  Surgeon: Marcine Matar, MD;  Location: [facility] ORS;  Service: Urology;  Laterality: Right;   CYSTOSCOPY/URETEROSCOPY/HOLMIUM LASER/STENT PLACEMENT Right 05/27/2022   Procedure: CYSTOSCOPY, URETEROSCOPY, HOLMIUM LASER, STENT EXCHANGE;  Surgeon: Rene Paci, MD;  Location: [facility]S;  Service: Urology;  Laterality: Right;   INCISION AND DRAINAGE PERIRECTAL ABSCESS N/A 09/01/2021   Procedure: IRRIGATION AND DEBRIDEMENT PERIRECTAL ABSCESS;  Surgeon: Sheliah Hatch De Blanch, MD;  Location: [facility] ORS;  Service: General;  Laterality: N/A;   IR PERC TUN PERIT CATH WO PORT S&I Judi Cong  03/06/2021   IR REMOVAL TUN CV CATH W/O FL  04/14/2021   KNEE ARTHROSCOPY  07/22/2011   Procedure: ARTHROSCOPY KNEE;  Surgeon: Loanne Drilling;  Location: [facility];  Service: Orthopedics;  Laterality: Right;  WITH DEBRIDEMENT    KNEE ARTHROSCOPY W/ MENISCECTOMY  X2 IN 2002-- RIGHT KNEE   LAPAROSCOPIC GASTRIC BANDING  01-15-09   LEFT HEART CATH AND CORONARY ANGIOGRAPHY N/A 04/29/2017   Procedure: LEFT HEART CATH AND CORONARY ANGIOGRAPHY;  Surgeon: Laurey Morale, MD;  Location: [facility] INVASIVE CV LAB;  Service: Cardiovascular;  Laterality: N/A;   LEFT HEART CATHETERIZATION WITH CORONARY ANGIOGRAM N/A 09/11/2013   Procedure: LEFT HEART CATHETERIZATION WITH CORONARY ANGIOGRAM;  Surgeon: Laurey Morale, MD;  Location: [facility] CATH LAB;  Service: Cardiovascular;  Laterality: N/A;   MITRAL VALVE REPAIR Right 03/25/2016   Procedure: MINIMALLY INVASIVE REOPERATION FOR MITRAL VALVE REPAIR  (MVR) with size 26 Sorin Memo 3D;  Surgeon: Purcell Nails, MD;  Location: [facility] OR;  Service: Open Heart Surgery;  Laterality: Right;   MITRAL VALVE REPAIR  03/2016   RIGHT KNEE ED COMPARTMENT REPLACEMENT  2004   SYNOVECTOMY  07/22/2011   Procedure: SYNOVECTOMY;  Surgeon: Gus Rankin Aluisio;  Location: [facility];  Service: Orthopedics;;   TEE WITHOUT CARDIOVERSION N/A 01/23/2016   Procedure: TRANSESOPHAGEAL ECHOCARDIOGRAM (TEE);  Surgeon: Laurey Morale, MD;  Location: [facility] ENDOSCOPY;  Service: Cardiovascular;  Laterality: N/A;   TEE WITHOUT CARDIOVERSION N/A 03/25/2016   Procedure: TRANSESOPHAGEAL ECHOCARDIOGRAM (TEE);  Surgeon: Purcell Nails, MD;  Location: [facility] OR;  Service: Open Heart Surgery;  Laterality: N/A;   TEE WITHOUT CARDIOVERSION N/A 03/04/2021   Procedure: TRANSESOPHAGEAL ECHOCARDIOGRAM (TEE);  Surgeon: Lewayne Bunting, MD;  Location: [facility] ENDOSCOPY;  Service: Cardiovascular;  Laterality: N/A;   TEE WITHOUT CARDIOVERSION N/A 04/14/2022   Procedure: TRANSESOPHAGEAL ECHOCARDIOGRAM (TEE);  Surgeon: Laurey Morale, MD;  Location: [facility] ENDOSCOPY;  Service: Cardiovascular;  Laterality: N/A;   UMBILICAL HERNIA REPAIR  01-15-09   W/ GASTRIC BANDING PROCEDURE    FAMILY HISTORY: Family History  Problem Relation Age of Onset   Other Mother        joint problems   Alzheimer's disease Mother    Hypertension Father    Heart disease Father    CVA Father        age 65   Depression Father    Heart disease Sister        CABG   Stroke Sister    Heart failure Sister        congestive   Diabetes Sister    Heart disease Brother    Hypertension Brother    Congestive Heart Failure Brother     SOCIAL HISTORY: Social History   Socioeconomic History   Marital status: Married    Spouse name: Darl Pikes   Number of children: 1   Years of education: Not on file  Highest education level: Master's degree (e.g., MA, MS, MEng, MEd, MSW, MBA)  Occupational History    Comment: Radio broadcast assistant retired  Tobacco Use   Smoking status: Former    Types: Cigarettes   Smokeless tobacco: Never   Tobacco comments:    Former smoke 04/08/22  Vaping Use   Vaping status: Never Used  Substance and Sexual Activity   Alcohol use: Not Currently    Alcohol/week: 2.0 standard drinks of alcohol    Types: 1 Glasses of wine, 1 Shots of liquor per week    Comment: OCCASIONAL   Drug use: Never   Sexual activity: Not Currently  Other Topics Concern    Not on file  Social History Narrative   Lives with wife   occas soda   Social Determinants of Health   Financial Resource Strain: Not on file  Food Insecurity: No Food Insecurity (03/27/2021)   Hunger Vital Sign    Worried About Running Out of Food in the Last Year: Never true    Ran Out of Food in the Last Year: Never true  Transportation Needs: No Transportation Needs (04/15/2022)   PRAPARE - Administrator, Civil Service (Medical): No    Lack of Transportation (Non-Medical): No  Physical Activity: Not on file  Stress: Not on file  Social Connections: Not on file  Intimate Partner Violence: Not on file     PHYSICAL EXAM  GENERAL EXAM/CONSTITUTIONAL: Vitals:  Vitals:   03/30/23 1515 03/30/23 1518  BP: 119/77 118/70  Pulse: (!) 58 (!) 53  Weight: 192 lb 3.2 oz (87.2 kg)   Height: 5\' 10"  (1.778 m)    Body mass index is 27.58 kg/m. Wt Readings from Last 3 Encounters:  03/30/23 192 lb 3.2 oz (87.2 kg)  03/12/23 192 lb (87.1 kg)  03/09/23 194 lb 9.6 oz (88.3 kg)   Patient is in no distress; well developed, nourished and groomed; neck is supple  CARDIOVASCULAR: Examination of carotid arteries is normal; no carotid bruits Regular rate and rhythm, no murmurs Examination of peripheral vascular system by observation and palpation is normal  EYES: Ophthalmoscopic exam of optic discs and posterior segments is normal; no papilledema or hemorrhages No results found.  MUSCULOSKELETAL: Gait, strength, tone, movements noted in Neurologic exam below  NEUROLOGIC: MENTAL STATUS:     04/01/2021   11:45 AM  MMSE - Mini Mental State Exam  Orientation to time 4  Orientation to Place 4  Registration 3  Attention/ Calculation 1  Recall 1  Language- name 2 objects 2  Language- repeat 0  Language- follow 3 step command 3  Language- read & follow direction 1  Write a sentence 1  Copy design 0  Total score 20   awake, alert, oriented to person, place and  time recent and remote memory intact normal attention and concentration language fluent, comprehension intact, naming intact fund of knowledge appropriate  CRANIAL NERVE:  2nd - no papilledema on fundoscopic exam 2nd, 3rd, 4th, 6th - pupils equal and reactive to light, visual fields full to confrontation, extraocular muscles intact, no nystagmus 5th - facial sensation symmetric 7th - facial strength symmetric 8th - hearing intact 9th - palate elevates symmetrically, uvula midline 11th - shoulder shrug symmetric 12th - tongue protrusion midline  MOTOR:  normal bulk and tone ATROPHY AND WEAKNESS OF LEFT > RIGHT HAND INTRINSIC MUSCLES RUE 4, LUE 5 RLE 4 PROX AND 2 DISTAL LLE 5  SENSORY:  normal and symmetric to light touch, temperature,  vibration; DECR IN FINGERS AND FEET  COORDINATION:  finger-nose-finger, fine finger movements normal  REFLEXES:  deep tendon reflexes TRACE and symmetric  GAIT/STATION:  narrow based gait; LIMPING GAIT; RIGHT FOOT / LEG WEAKNESS    DIAGNOSTIC DATA (LABS, IMAGING, TESTING) - I reviewed patient records, labs, notes, testing and imaging myself where available.  Lab Results  Component Value Date   WBC 10.6 (H) 03/09/2023   HGB 13.0 03/09/2023   HCT 38.5 (L) 03/09/2023   MCV 95.3 03/09/2023   PLT 192 03/09/2023      Component Value Date/Time   NA 137 03/09/2023 1559   NA 140 09/03/2017 0933   K 4.0 03/09/2023 1559   CL 100 03/09/2023 1559   CO2 26 03/09/2023 1559   GLUCOSE 135 (H) 03/09/2023 1559   BUN 46 (H) 03/09/2023 1559   BUN 24 09/03/2017 0933   CREATININE 4.42 (H) 03/09/2023 1559   CREATININE 1.66 (H) 06/05/2016 1413   CALCIUM 10.0 03/09/2023 1559   PROT 6.0 (L) 02/16/2023 1234   ALBUMIN 3.8 02/16/2023 1234   AST 17 02/16/2023 1234   ALT 19 02/16/2023 1234   ALKPHOS 93 02/16/2023 1234   BILITOT 0.6 02/16/2023 1234   GFRNONAA 13 (L) 03/09/2023 1559   GFRAA 18 (L) 03/07/2020 1046   Lab Results  Component Value Date    CHOL 90 02/16/2023   HDL 46 02/16/2023   LDLCALC 23 02/16/2023   TRIG 106 02/16/2023   CHOLHDL 2.0 02/16/2023   Lab Results  Component Value Date   HGBA1C 6.8 (A) 01/11/2023   Lab Results  Component Value Date   VITAMINB12 905 03/07/2021   Lab Results  Component Value Date   TSH 2.211 02/16/2023    10/05/20 MRI brain [I reviewed images myself and agree with interpretation. -VRP]  - No acute finding by MRI. Moderate chronic small-vessel ischemic changes of the cerebral hemispheric white matter. 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 Old small right parieto-occipital cortical infarction.  02/28/2021 MRI brain [I reviewed images myself and agree with interpretation. -VRP] 1. New 6 mm acute ischemic nonhemorrhagic infarct involving the posterior left lentiform nucleus. 2. Normal expected interval evolution of recently identified small left cerebral white matter infarct. 3. Underlying age-related cerebral atrophy with moderate chronic microvascular ischemic disease and multiple remote ischemic infarcts as above, stable.    ASSESSMENT AND PLAN  77 y.o. year old male here with numbness and tingling in the hands, lower extremities, gait difficulty, history of right knee replacement, coronary artery disease, chronic kidney disease, atrial fibrillation, dyspnea on exertion.  Patient has signs symptoms consistent with polyneuropathy including atrophy and intrinsic hand muscles.  He also has some gait difficulty related to prior knee surgery and musculoskeletal issues.    Dx:  1. Right leg weakness   2. Gait disturbance       PLAN:  RIGHT SIDED WEAKNESS (right arm and right leg) + GAIT DIFF - due to left brain subcortical stroke - continue eliquis for afib, praluent for lipid mgmt, CPAP for OSA - consider PT exaluation; use cane /walker; fall precautions reviewed  RIGHT FOOT DROP (may be due to stroke vs right peroneal neuropathy) - slightly improving; recommend to  continue PT exercises; AVOID LEG CROSSING  UPPER AND LOWER EXT WEAKNESS / NUMBNESS (polyneuropathy; borderline diabetes and CKD) - axonal polyneuropathy  Return for pending if symptoms worsen or fail to improve, return to PCP.    Suanne Marker, MD 03/30/2023, 4:15 PM Certified in Neurology, Neurophysiology and  Neuroimaging  [facility] [address] [place], [place] [address] [phone]

## 2023-04-07 NOTE — Progress Notes (Signed)
Patient ID: AMYR WASZAK, male   DOB: 20-Jan-1946, 77 y.o.   MRN: [id number] PCP: Dr Tenny Craw Cardiology: Dr. Shirlee Latch  77 y.o. with history of CAD s/p CABG and obesity s/p lap banding procedure presents for cardiology followup.  Given exertional dyspnea and chest tightness, he had LHC in 1/15.  This showed no change from prior.  Grafts were patent and there was severe stenosis in distal D1, not amenable to PCI.   In 2017, he developed increased exertional dyspnea.  Eventually had a TEE showing severe MR with prolapse of a portion of the anterior mitral valve leaflet.  Coronary angiography in 6/17 showed patent grafts and 90% stenosis of distal diagonal not amenable to intervention.  In 8/17, he had mitral valve repair.  Post-op diastolic CHF, Lasix started and increased.   He had a cath again in 9/18 showing stable coronary anatomy.    He developed atrial arrhythmias in 12/18.  Both fibrillation and flutter were seen.  Saw Dr. Johney Frame, recommended DCCV with amiodarone or sotalol if fibrillation recurs.  He had DCCV in 1/19.   In 6/19, he was noted to have developed AKI. Creatinine went up to 3.46.  Renal US was done, showing normal kidneys. Cause of AKI was not clear.  We stopped his Lasix. He saw nephrology and eventually went back on Lasix, currently taking 80 mg daily.  He is not using any NSAIDs.    He stopped pravastatin due to myalgias. We struggled to get him on a PCSK9 inhibitor through the [place] but he was finally able to get Praluent.    He had a Lexiscan Cardiolite in 11/19, which showed old inferior MI with no ischemia.  Echo in 11/19 showed EF 55-60%, s/p MV repair with mean gradient 6 mmHg. Echo 12/20 showed EF 50-55%, mildly decreased RV systolic function, repaired mitral valve with mild MR, mean gradient 6 mmHg across MV.    He had DCCV from atrial fibrillation to NSR in 1/21 and again in 2/21.   At last appointment, he reported significant fatigue and dyspnea in setting of weight loss  with low BP and HR.  I stopped his Coreg. He felt better with that change.    In 9/21, he had COVID-19 infection but was not hospitalized.   Zio patch in 3/22 showed frequent short atrial tachycardia episodes.  He saw Dr. Johney Frame, medical therapy recommended (on amiodarone).   He was admitted in 5/22 with AKI from obstruction of right ureter by stone, he was treated by urology.   He was admitted in 6/22 with right-sided weakness and was found to have left cerebral white matter CVA.  He had been compliant with Eliquis, ASA 81 was added.   He was admitted in 7/22 with Enterococcal bacteremia.  TEE showed oscillating density attached to the mitral annulus, ?suture.  However, could not rule out vegetation so had 6 wks of IV abx for SBE.   Echo in 8/22 showed EF 55-60%, moderate focal basal septal hypertrophy, normal RV, severe LAE, s/p mitral valve repair with mean gradient 8 mmHg (mild to moderate mitral stenosis) and mild MR, large PFO, normal IVC.   Patient was noted to re-develop atrial fibrillation.  TEE was done in 8/23, LV EF was 55% with mild RV enlargement and normal RV function, s/p MV repair with mean gradient 10 mmHg but MVA 2.19 cm^2 (leaflets open well), trivial MR.  He was cardioverted back to NSR.   Follow up 6/24, he was in rate controlled AF,  for about a month. Zio monitor in 6/24 showed continuous AF. Endorsed worsening NYHA III symptoms and volume overloaded. Lasix increased to 80/40. Arranged for DCCV on 02/22/23, successful to NSR.  Unfortunately, at return f/u he was back in AF and felt poor.  More fatigued and worn out but not markedly short of breath.  ReDs 33%. He was arranged for repeat DCCV, done on 03/12/23. Converted back to NSR.   He presents back today for post procedural f/u. Here w/ wife. EKG shows he is back in NSR, HR controlled in the 80s. He has been unaware of being back in afib. Denies palpitations and has not had much fatigue but has noticed slight increase in  exertional dyspnea, orthopnea and wt gain. Wt up ~8-10 lb. ReDs elevated at 51%. He reports full compliance w/ home diuretic regimen and reports good UOP but admits to dietary indiscretion w/ sodium.       REDs: 51%   ECG (personally reviewed): Atrial fibrillation rate 67 bpm   Labs (1/19): K 4.9, creatinine 1.76, hgb 14 Labs (6/19): K 3.6, creatinine 3.46 Labs (7/19): K 4.1, creatinine 3.66, hgb 12.7 Labs (10/19): LDL 177, hgb 13.4, troponin negative, K 4, creatinine 4.15 Labs (12/19): K 4.3, creatinine 3.88 Labs (4/20): K 3.6, creatinine 3.39 Labs (11/20): LDL 73, HDL 38 Labs (2/21): K 3.4, creatinine 3.8 Labs (7/21): K 3.7, creatinine 3.66 Labs (6/22): K 4, creatinine 2.88, LDL 50 Labs (8/22): K 3.6, creatinine 2.79 Labs (1/23): K 4.3, creatinine 3.21 Labs (4/23): LDL 100 Labs (8/23): K 3.5, creatinine 4.56, hgb 10.3 Labs (4/24): K 3.3, creatinine 4.22, LFTs normal, TSH normal Labs (6/24): K 4.0, creatinine 4.32, LFTs normal, TSH normal, LDL 23, HDL 46 Labs (7/24): K 3.5, creatinine 4.42  PMH: 1. OSA: on CPAP.  2. Diabetes mellitus 3. Allergic rhinitis 4. CAD: s/p CABG 1995.  LHC (10/11) with 90% distal D1 (patent proximal D1 stent), total occlusion CFX, total occlusion RCA, no significant stenoses in LAD, SVG-PDA patent, SVG-OM patent, EF 55%.  Medical management.  LHC (5/13) with patent grafts and 90% distal D1 stenosis (no significant change from 10/11).  LHC (1/15) with patent grafts, 75% ISR in D1 stent, 90% stenosis distal D1 (small caliber). D1 not amenable to intervention.  - Coronary angiography (6/17): grafts patent, 90% stenosis distally in large diagonal not amenable to intervention.   - LHC (9/18): SVG-OM and SVG-PDA patent,  75% in-stent restenosis in proximal diagonal then 90% stenosis distally (small in caliber at that point), totally occluded LCx and RCA.  Left coronary system similar to prior caths, D1 may be source of angina.  - Lexiscan Cardiolite (11/19): EF  47%, old inferior MI, no ischemia.  5. Obesity: lap-banding in 5/11.  6. Chronic diastolic CHF: Echo (8/17) with EF 55-60%, moderate LVH, moderate diastolic dysfunction, s/p mitral valve repair with elevated mean gradient but normal pressure half-time.  - Echo (8/18): EF 55-60%, s/p MV repair with mean gradient 6 mmHg, PASP 28 mmHg, mildly decreased RV systolic function.  - Echo (11/19): EF 55-60%, s/p MV repair with mean gradient 6, PASP 42 mmHg, mildly dilated RV, dilated IVC.  - Echo (12/20): EF 50-55%, mildly decreased RV systolic function. S/p MV repair with mean gradient 6 mmHg, mild MR.  - Echo (2/22): EF 45-50%, mild LVH, moderate RV enlargement with low normal systolic function, s/p mitral valve repair with mean gradient 5 mmHg and trivial MR, mild-moderate AS.  - Echo (8/22): EF 55-60%, moderate focal basal septal hypertrophy,  normal RV, severe LAE, s/p mitral valve repair with mean gradient 8 mmHg (mild to moderate mitral stenosis) and mild MR, large PFO, normal IVC. - TEE (8/23): LV EF was 55% with mild RV enlargement and normal RV function, s/p MV repair with mean gradient 10 mmHg but MVA 2.19 cm^2 (leaflets open well), trivial MR. 7. PFO: noted on 8/22 echo.  8. OA: Knees, C-spine. TKR 9/13.  9. HTN 10. Hyperlipidemia: Myalgias with atorvastatin and Crestor. Now on Praluent.  11. Diverticulosis 12. Mitral regurgitation: TEE (6/17) with EF 55-60%, severe eccentric MR, prolapse of a portion of the anterior leaflet, peak RV-RA gradient 45 mmHg.  - Mitral valve repair 8/17 - Echo in 8/22 with repaired MV, mean gradient 8 mmHg (mild-moderate MS), mild MR.  - TEE in 8/23 with MV repair with mean gradient 10 mmHg but MVA 2.19 cm^2 (leaflets open well), trivial MR. 13. CKD: Stage III.  14. Atrial fibrillation/atrial flutter: Both arrhythmias noted in 12/18.  DCCV 12/19.   - DCCV to NSR 1/21.  - DCCV to NSR 2/21.  - DCCV to NSR in 8/23 - DCCV to NSR 7/24 15. AKI 6/19 => CKD stage 4:  uncertain etiology.  Renal US was normal.  16. Gout 17. COVID-19 infection 9/21 18. 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  - Carotid dopplers (6/22) with mild disease only.  19. Nephrolithiasis.  20. Atrial tachycardia: Noted on 3/22 Zio patch.  21. Aortic stenosis: mild-moderate on 2/22 echo but no significant stenosis on 8/22 echo.  - No AS on TEE in 8/23.  22. 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  TEE 7/22 with oscillating density attached to the mitral annulus, ?suture.  However, could not rule out vegetation so had 6 wks of IV abx for SBE.  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  Lives in [place].   Family History  Problem Relation Age of Onset   Other Mother        joint problems   Alzheimer's disease Mother    Hypertension Father    Heart disease Father    CVA Father        age 34   Depression Father    Heart disease Sister        CABG   Stroke Sister    Heart failure Sister        congestive   Diabetes Sister    Heart disease Brother    Hypertension Brother    Congestive Heart Failure Brother     ROS: All systems reviewed and negative except as per HPI.   Current Outpatient Medications  Medication Sig Dispense Refill   albuterol (VENTOLIN HFA) 108 (90 Base) MCG/ACT inhaler Inhale 1-2 puffs into the lungs every 6 (six) hours as needed for shortness of breath or wheezing.     Alirocumab (PRALUENT) 75 MG/ML SOAJ Inject 75 mg into the skin every 14 (fourteen) days.     allopurinol (ZYLOPRIM) 100 MG tablet Take 50 mg by mouth 2 (two) times a week.     amiodarone (PACERONE) 200 MG tablet Take 1 tablet (200 mg total) by mouth in the morning.     apixaban (ELIQUIS) 5 MG TABS tablet Take 1 tablet (5 mg total) by mouth 2 (two) times daily. 180 tablet 3   busPIRone (BUSPAR) 10 MG tablet Take 5 mg by mouth 2 (two) times daily.     cetirizine (ZYRTEC) 10 MG tablet Take 10 mg by mouth in the morning.  colchicine  0.6 MG tablet Take 0.6-1.2 mg by mouth 2 (two) times daily as needed (gout).      Cyanocobalamin (VITAMIN B-12) 5000 MCG TBDP Take 5,000 mcg by mouth 2 (two) times a week.     cycloSPORINE (RESTASIS) 0.05 % ophthalmic emulsion Place 1 drop into both eyes 2 (two) times daily as needed (eye irritation.).     diclofenac Sodium (VOLTAREN) 1 % GEL Apply 2 g topically 3 (three) times daily as needed for pain.     docusate sodium (COLACE) 100 MG capsule Take 200 mg by mouth at bedtime.     ezetimibe (ZETIA) 10 MG tablet Take 1 tablet (10 mg total) by mouth daily. 90 tablet 3   ferrous sulfate 325 (65 FE) MG tablet Take 325 mg by mouth every other day.     fluticasone (FLONASE) 50 MCG/ACT nasal spray Place 1 spray into both nostrils daily as needed for allergies or rhinitis.     furosemide (LASIX) 40 MG tablet Take 2 tablets (80 mg total) by mouth every morning AND 1 tablet (40 mg total) every evening. 254 tablet 3   HYDROcodone-acetaminophen (NORCO) 10-325 MG tablet Take 1 tablet by mouth 3 (three) times daily as needed for moderate pain.     Meclizine HCl 25 MG CHEW Chew 25 mg by mouth 3 (three) times daily as needed (dizziness).     methocarbamol (ROBAXIN) 500 MG tablet Take 500 mg by mouth 3 (three) times daily as needed for muscle spasms.     Multiple Vitamin (MULTIVITAMIN) capsule Take 1 capsule by mouth in the morning.     nitroGLYCERIN (NITROSTAT) 0.4 MG SL tablet 1 TAB UNDER THE TONGUE EVERY 5 MINUTES AS NEEDED FOR CHEST PAIN UP TO 3 DOSES, IF PERSIST CALL 911 25 tablet 3   ONETOUCH VERIO test strip 1 each daily.     pantoprazole (PROTONIX) 40 MG tablet Take 40 mg by mouth daily as needed (heartburn).     potassium chloride SA (KLOR-CON M) 20 MEQ tablet Take 20 mEq by mouth daily.     Semaglutide,0.25 or 0.5MG /DOS, 2 MG/3ML SOPN Inject 0.5 mg into the skin once a week. 9 mL 3   tadalafil (CIALIS) 20 MG tablet Take 20 mg by mouth daily as needed for erectile dysfunction.     testosterone cypionate  (DEPOTESTOSTERONE CYPIONATE) 200 MG/ML injection Inject 200 mg into the muscle once a week.     traZODone (DESYREL) 100 MG tablet Take 100 mg by mouth at bedtime.     No current facility-administered medications for this encounter.   BP 110/74   Pulse 73   Wt 92.4 kg (203 lb 9.6 oz)   SpO2 93%   BMI 29.21 kg/m    Wt Readings from Last 3 Encounters:  04/08/23 92.4 kg (203 lb 9.6 oz)  03/30/23 87.2 kg (192 lb 3.2 oz)  03/12/23 87.1 kg (192 lb)   PHYSICAL EXAM: General:  Well appearing. No respiratory difficulty HEENT: normal Neck: supple. no JVD. Carotids 2+ bilat; no bruits. No lymphadenopathy or thyromegaly appreciated. Cor: PMI nondisplaced. Irregularly irregular rhythm and rate. No rubs, gallops or murmurs. Lungs: decreased BS at the bases bilaterally  Abdomen: soft, nontender, nondistended. No hepatosplenomegaly. No bruits or masses. Good bowel sounds. Extremities: no cyanosis, clubbing, rash, edema Neuro: alert & oriented x 3, cranial nerves grossly intact. moves all 4 extremities w/o difficulty. Affect pleasant.  Assessment/Plan:  1. Acute on Chronic, Primarily diastolic CHF:  TEE in 8/23 with EF 55%, mild  RV enlargement with normal RV function.  Echo 7/24 EF 55-60%. RV mildly reduced. NYHA class III symptoms + volume overload. CHF is worsened by CKD stage IV and persistent AF. ReDs 51%. He notes continued decent UOP w/ Lasix but admits to dietary indiscretion w/ fluid intake.  - increase Lasix from 80/40 to 80 mg bid and fluid restrict  - GDMT limited by CKD  2. S/p mitral valve repair:  Echo in 8/22 showed mild-moderate stenosis with mean gradient 8 mmHg and mild MR.  TEE in 8/23 showed MV repair with mean gradient 10 mmHg but MVA 2.19 cm^2 (leaflets open well), trivial MR.  Echo 7/24 mild MR, mild MS, mG 7.0   - With prosthetic material, should use antibiotic prophylaxis with dental work.  3. Persistent Atrial Fibrillation  - s/p MAZE w/ recurrence.  Failed 2 recent back to  back cardioversions, despite amiodarone. H/o bradycardia limits further amio titration. Followed by Dr. Nelly Laurence. Given severe LAE, suspect the chance of durable sinus rhythm is low. Doubt ablation would be beneficial. Focus will be rate control for now   - he is rate controlled in the 60s. - Continue amiodarone 200 mg daily. Normal LFTs, TSH 6/24.  He will need a regular eye exam.  - Continue apixaban 5 mg bid.  4. CAD:  He has severe stenosis in a distal D1 that is not amenable to intervention.  This has been stable on last couple of caths. He has occluded RCA and LCx known from the past with SVGs to OM and PDA.  Echo in 2/22 with EF 45-50%. Cardiolite in 11/19 showed no ischemia. Echo in 8/22 with EF up to 55-60%, TEE in 8/23 with EF 55%.  No s/s ischemia.   - Currently not on ASA, taking apixaban.  - He cannot tolerate statins with myalgias, Now on Praluent.    5. Hyperlipidemia: Myalgias with Crestor, pravastatin, and atorvastatin.  We were finally able to get him Praluent through the [place]. He is also on Zetia.  - Good lipids 6/24. 6. OSA: Compliant w/ CPAP.  7. CKD stage 4: AKI without recovery, uncertain etiology.  He sees nephrology, there is concern that he is progressing towards HD. He now has AV fistula. Recent creatinine 4.42.   - Check BMP today  8. Diabetes: He is now followed by endocrinology. 9. Atrial tachycardia: Paroxysmal.  Seen by Dr. Johney Frame, medical management planned.  Continue amiodarone.  10. CVA: Though to be atrial fibrillation-related.  Carotid dopplers 6/22 with mild stenosis only. However, he was also noted on 8/22 echo to have a PFO.  - Continue on Eliquis.  11. Enterococcal bacteremia: In 7/22.  On TEE, it was hard to differentiate between loose suture material and small vegetation.  He was treated for 6 wks with IV abx.   F/u in 1 wk w/ APP to reassess volume status and repeat labs.   Robbie Lis PA-C 04/08/2023

## 2023-04-08 ENCOUNTER — Telehealth (HOSPITAL_COMMUNITY): Payer: Self-pay

## 2023-04-08 ENCOUNTER — Encounter (HOSPITAL_COMMUNITY): Payer: Self-pay

## 2023-04-08 ENCOUNTER — Ambulatory Visit (HOSPITAL_COMMUNITY)
Admission: RE | Admit: 2023-04-08 | Discharge: 2023-04-08 | Disposition: A | Discharge status: Home / Self Care | Payer: No Typology Code available for payment source | Source: Ambulatory Visit | Attending: Cardiology | Admitting: Cardiology

## 2023-04-08 VITALS — BP 110/74 | HR 73 | Wt 203.6 lb

## 2023-04-08 DIAGNOSIS — I4719 Other supraventricular tachycardia: Secondary | ICD-10-CM | POA: Diagnosis not present

## 2023-04-08 DIAGNOSIS — Z7985 Long-term (current) use of injectable non-insulin antidiabetic drugs: Secondary | ICD-10-CM | POA: Diagnosis not present

## 2023-04-08 DIAGNOSIS — I4819 Other persistent atrial fibrillation: Secondary | ICD-10-CM | POA: Insufficient documentation

## 2023-04-08 DIAGNOSIS — Z8249 Family history of ischemic heart disease and other diseases of the circulatory system: Secondary | ICD-10-CM | POA: Diagnosis not present

## 2023-04-08 DIAGNOSIS — Z7901 Long term (current) use of anticoagulants: Secondary | ICD-10-CM | POA: Insufficient documentation

## 2023-04-08 DIAGNOSIS — I5032 Chronic diastolic (congestive) heart failure: Secondary | ICD-10-CM | POA: Diagnosis not present

## 2023-04-08 DIAGNOSIS — I052 Rheumatic mitral stenosis with insufficiency: Secondary | ICD-10-CM | POA: Insufficient documentation

## 2023-04-08 DIAGNOSIS — I2510 Atherosclerotic heart disease of native coronary artery without angina pectoris: Secondary | ICD-10-CM | POA: Insufficient documentation

## 2023-04-08 DIAGNOSIS — I5033 Acute on chronic diastolic (congestive) heart failure: Secondary | ICD-10-CM | POA: Diagnosis not present

## 2023-04-08 DIAGNOSIS — Z8774 Personal history of (corrected) congenital malformations of heart and circulatory system: Secondary | ICD-10-CM | POA: Insufficient documentation

## 2023-04-08 DIAGNOSIS — Z79899 Other long term (current) drug therapy: Secondary | ICD-10-CM | POA: Diagnosis not present

## 2023-04-08 DIAGNOSIS — E1122 Type 2 diabetes mellitus with diabetic chronic kidney disease: Secondary | ICD-10-CM | POA: Diagnosis not present

## 2023-04-08 DIAGNOSIS — Z9884 Bariatric surgery status: Secondary | ICD-10-CM | POA: Diagnosis not present

## 2023-04-08 DIAGNOSIS — I130 Hypertensive heart and chronic kidney disease with heart failure and stage 1 through stage 4 chronic kidney disease, or unspecified chronic kidney disease: Secondary | ICD-10-CM | POA: Diagnosis not present

## 2023-04-08 DIAGNOSIS — Z951 Presence of aortocoronary bypass graft: Secondary | ICD-10-CM | POA: Insufficient documentation

## 2023-04-08 DIAGNOSIS — G4733 Obstructive sleep apnea (adult) (pediatric): Secondary | ICD-10-CM | POA: Diagnosis not present

## 2023-04-08 DIAGNOSIS — Z823 Family history of stroke: Secondary | ICD-10-CM | POA: Diagnosis not present

## 2023-04-08 DIAGNOSIS — Z8673 Personal history of transient ischemic attack (TIA), and cerebral infarction without residual deficits: Secondary | ICD-10-CM | POA: Insufficient documentation

## 2023-04-08 DIAGNOSIS — N184 Chronic kidney disease, stage 4 (severe): Secondary | ICD-10-CM | POA: Diagnosis not present

## 2023-04-08 DIAGNOSIS — E785 Hyperlipidemia, unspecified: Secondary | ICD-10-CM | POA: Diagnosis not present

## 2023-04-08 DIAGNOSIS — E291 Testicular hypofunction: Secondary | ICD-10-CM | POA: Diagnosis not present

## 2023-04-08 LAB — BRAIN NATRIURETIC PEPTIDE: B Natriuretic Peptide: 293.6 pg/mL — ABNORMAL HIGH (ref 0.0–100.0)

## 2023-04-08 LAB — BASIC METABOLIC PANEL
Anion gap: 9 (ref 5–15)
BUN: 38 mg/dL — ABNORMAL HIGH (ref 8–23)
CO2: 26 mmol/L (ref 22–32)
Calcium: 9.1 mg/dL (ref 8.9–10.3)
Chloride: 104 mmol/L (ref 98–111)
Creatinine, Ser: 4.27 mg/dL — ABNORMAL HIGH (ref 0.61–1.24)
GFR, Estimated: 14 mL/min — ABNORMAL LOW (ref >60)
Glucose, Bld: 204 mg/dL — ABNORMAL HIGH (ref 70–99)
Potassium: 3.5 mmol/L (ref 3.5–5.1)
Sodium: 139 mmol/L (ref 135–145)

## 2023-04-08 MED ORDER — FUROSEMIDE 40 MG PO TABS: 80.0000 mg | ORAL_TABLET | Freq: Two times a day (BID) | ORAL | 3 refills | Status: DC

## 2023-04-08 MED ORDER — NITROGLYCERIN 0.4 MG SL SUBL: SUBLINGUAL_TABLET | SUBLINGUAL | 3 refills | Status: DC

## 2023-04-08 MED ORDER — POTASSIUM CHLORIDE CRYS ER 20 MEQ PO TBCR: 40.0000 meq | EXTENDED_RELEASE_TABLET | Freq: Every day | ORAL | 7 refills | Status: DC

## 2023-04-08 NOTE — Progress Notes (Signed)
ReDS Vest / Clip - 04/08/23 1000       ReDS Vest / Clip   Station Marker C    Ruler Value 27    ReDS Value Range High volume overload    ReDS Actual Value 52

## 2023-04-08 NOTE — Telephone Encounter (Addendum)
Pt aware, agreeable, and verbalized understanding  ----- Message from Robbie Lis sent at 04/08/2023  2:55 PM EDT ----- Renal fx is sable. K is low normal. We increased his lasix in clinic today. Please notify pt to also increase his KCl to 40 mEq daily. We will repeat labs at f/u visit next wk

## 2023-04-08 NOTE — Patient Instructions (Signed)
Medication Changes:  INCREASE LASIX TO 80MG  TWICE DAILY   Lab Work:  Labs done today, your results will be available in MyChart, we will contact you for abnormal readings.  Follow-Up in: 1 WEEK AS SCHEDULED WITH APP   At the [facility], you and your health needs are our priority. We have a designated team specialized in the treatment of Heart Failure. This Care Team includes your primary Heart Failure Specialized Cardiologist (physician), Advanced Practice Providers (APPs- Physician Assistants and Nurse Practitioners), and Pharmacist who all work together to provide you with the care you need, when you need it.   You may see any of the following providers on your designated Care Team at your next follow up:  Dr. Arvilla Meres Dr. Marca Ancona Dr. Marcos Eke, NP Robbie Lis, [place] [facility] [place], [place] Brynda Peon, NP Karle Plumber, PharmD   Please be sure to bring in all your medications bottles to every appointment.   Need to Contact us:  If you have any questions or concerns before your next appointment please send [place] a message through [place] or call our office at [phone].    TO LEAVE A MESSAGE FOR THE NURSE SELECT OPTION 2, PLEASE LEAVE A MESSAGE INCLUDING: YOUR NAME DATE OF BIRTH CALL BACK NUMBER REASON FOR CALL**this is important as we prioritize the call backs  YOU WILL RECEIVE A CALL BACK THE SAME DAY AS LONG AS YOU CALL BEFORE 4:00 PM

## 2023-04-14 ENCOUNTER — Telehealth (HOSPITAL_COMMUNITY): Payer: Self-pay | Admitting: Vascular Surgery

## 2023-04-14 NOTE — Progress Notes (Signed)
Patient ID: [name], male   DOB: [birth year], 77 y.o.   MRN: [id number] PCP: Dr Tenny Craw Cardiology: Dr. Shirlee Latch Nephrology: Dr Glenna Fellows  EP: Dr Nelly Laurence  [name] is a 77 y.o. with history of CAD s/p CABG and obesity s/p lap banding procedure/  Given exertional dyspnea and chest tightness, he had LHC in 1/15.  This showed no change from prior.  Grafts were patent and there was severe stenosis in distal D1, not amenable to PCI.   In 2017, he developed increased exertional dyspnea.  Eventually had a TEE showing severe Jason with prolapse of a portion of the anterior mitral valve leaflet.  Coronary angiography in 6/17 showed patent grafts and 90% stenosis of distal diagonal not amenable to intervention.  In 8/17, he had mitral valve repair.  Post-op diastolic CHF, Lasix started and increased.   He had a cath again in 9/18 showing stable coronary anatomy.    He developed atrial arrhythmias in 12/18.  Both fibrillation and flutter were seen.  Saw Dr. Johney Frame, recommended DCCV with amiodarone or sotalol if fibrillation recurs.  He had DCCV in 1/19.   In 6/19, he was noted to have developed AKI. Creatinine went up to 3.46.  Renal US was done, showing normal kidneys. Cause of AKI was not clear.  We stopped his Lasix. He saw nephrology and eventually went back on Lasix, currently taking 80 mg daily.  He is not using any NSAIDs.    He stopped pravastatin due to myalgias. We struggled to get him on a PCSK9 inhibitor through the [place] but he was finally able to get Praluent.    He had a Lexiscan Cardiolite in 11/19, which showed old inferior MI with no ischemia.  Echo in 11/19 showed EF 55-60%, s/p MV repair with mean gradient 6 mmHg. Echo 12/20 showed EF 50-55%, mildly decreased RV systolic function, repaired mitral valve with mild Jason, mean gradient 6 mmHg across MV.    He had DCCV from atrial fibrillation to NSR in 1/21 and again in 2/21.   At last appointment, he reported significant fatigue and dyspnea in  setting of weight loss with low BP and HR.  I stopped his Coreg. He felt better with that change.    In 9/21, he had COVID-19 infection but was not hospitalized.   Zio patch in 3/22 showed frequent short atrial tachycardia episodes.  He saw Dr. Johney Frame, medical therapy recommended (on amiodarone).   He was admitted in 5/22 with AKI from obstruction of right ureter by stone, he was treated by urology.   He was admitted in 6/22 with right-sided weakness and was found to have left cerebral white matter CVA.  He had been compliant with Eliquis, ASA 81 was added.   He was admitted in 7/22 with Enterococcal bacteremia.  TEE showed oscillating density attached to the mitral annulus, ?suture.  However, could not rule out vegetation so had 6 wks of IV abx for SBE.   Echo in 8/22 showed EF 55-60%, moderate focal basal septal hypertrophy, normal RV, severe LAE, s/p mitral valve repair with mean gradient 8 mmHg (mild to moderate mitral stenosis) and mild Jason, large PFO, normal IVC.   Patient was noted to re-develop atrial fibrillation.  TEE was done in 8/23, LV EF was 55% with mild RV enlargement and normal RV function, s/p MV repair with mean gradient 10 mmHg but MVA 2.19 cm^2 (leaflets open well), trivial Jason.  He was cardioverted back to NSR.   Follow up  6/24, he was in rate controlled AF, for about a month. Zio monitor in 6/24 showed continuous AF. Endorsed worsening NYHA III symptoms and volume overloaded. Lasix increased to 80/40. Arranged for DCCV on 02/22/23, successful to NSR.  Unfortunately, at return f/u he was back in AF and felt poor.  More fatigued and worn out but not markedly short of breath.  ReDs 33%. He was arranged for repeat DCCV, done on 03/12/23. Converted back to NSR.   He was seen in the HF clinic last week. Volume overloaded. Lasix was increased to 80 mg twice a day.   Today he returns for HF follow up. Overall feeling fine.Trying walk more but limited by leg pain. Says he can walk on a  flat surface 1 mile with breaks as needed. Remains short of breath with inclines.  Denies PND/Orthopnea. No chest pain. Appetite ok.  He is drinking less than 64 ounces. No fever or chills. Weight at home went 206--->200 pounds. Taking all medications.  REDs: 51%--->43% today   Labs (1/19): K 4.9, creatinine 1.76, hgb 14 Labs (6/19): K 3.6, creatinine 3.46 Labs (7/19): K 4.1, creatinine 3.66, hgb 12.7 Labs (10/19): LDL 177, hgb 13.4, troponin negative, K 4, creatinine 4.15 Labs (12/19): K 4.3, creatinine 3.88 Labs (4/20): K 3.6, creatinine 3.39 Labs (11/20): LDL 73, HDL 38 Labs (2/21): K 3.4, creatinine 3.8 Labs (7/21): K 3.7, creatinine 3.66 Labs (6/22): K 4, creatinine 2.88, LDL 50 Labs (8/22): K 3.6, creatinine 2.79 Labs (1/23): K 4.3, creatinine 3.21 Labs (4/23): LDL 100 Labs (8/23): K 3.5, creatinine 4.56, hgb 10.3 Labs (4/24): K 3.3, creatinine 4.22, LFTs normal, TSH normal Labs (6/24): K 4.0, creatinine 4.32, LFTs normal, TSH normal, LDL 23, HDL 46 Labs (7/24): K 3.5, creatinine 4.42 Labs (04/08/23): K 3.5 Creatinine 4.27   PMH: 1. OSA: on CPAP.  2. Diabetes mellitus 3. Allergic rhinitis 4. CAD: s/p CABG 1995.  LHC (10/11) with 90% distal D1 (patent proximal D1 stent), total occlusion CFX, total occlusion RCA, no significant stenoses in LAD, SVG-PDA patent, SVG-OM patent, EF 55%.  Medical management.  LHC (5/13) with patent grafts and 90% distal D1 stenosis (no significant change from 10/11).  LHC (1/15) with patent grafts, 75% ISR in D1 stent, 90% stenosis distal D1 (small caliber). D1 not amenable to intervention.  - Coronary angiography (6/17): grafts patent, 90% stenosis distally in large diagonal not amenable to intervention.   - LHC (9/18): SVG-OM and SVG-PDA patent,  75% in-stent restenosis in proximal diagonal then 90% stenosis distally (small in caliber at that point), totally occluded LCx and RCA.  Left coronary system similar to prior caths, D1 may be source of angina.   - Lexiscan Cardiolite (11/19): EF 47%, old inferior MI, no ischemia.  5. Obesity: lap-banding in 5/11.  6. Chronic diastolic CHF: Echo (8/17) with EF 55-60%, moderate LVH, moderate diastolic dysfunction, s/p mitral valve repair with elevated mean gradient but normal pressure half-time.  - Echo (8/18): EF 55-60%, s/p MV repair with mean gradient 6 mmHg, PASP 28 mmHg, mildly decreased RV systolic function.  - Echo (11/19): EF 55-60%, s/p MV repair with mean gradient 6, PASP 42 mmHg, mildly dilated RV, dilated IVC.  - Echo (12/20): EF 50-55%, mildly decreased RV systolic function. S/p MV repair with mean gradient 6 mmHg, mild Jason.  - Echo (2/22): EF 45-50%, mild LVH, moderate RV enlargement with low normal systolic function, s/p mitral valve repair with mean gradient 5 mmHg and trivial Jason, mild-moderate AS.  -  Echo (8/22): EF 55-60%, moderate focal basal septal hypertrophy, normal RV, severe LAE, s/p mitral valve repair with mean gradient 8 mmHg (mild to moderate mitral stenosis) and mild Jason, large PFO, normal IVC. - TEE (8/23): LV EF was 55% with mild RV enlargement and normal RV function, s/p MV repair with mean gradient 10 mmHg but MVA 2.19 cm^2 (leaflets open well), trivial Jason. 7. PFO: noted on 8/22 echo.  8. OA: Knees, C-spine. TKR 9/13.  9. HTN 10. Hyperlipidemia: Myalgias with atorvastatin and Crestor. Now on Praluent.  11. Diverticulosis 12. Mitral regurgitation: TEE (6/17) with EF 55-60%, severe eccentric Jason, prolapse of a portion of the anterior leaflet, peak RV-RA gradient 45 mmHg.  - Mitral valve repair 8/17 - Echo in 8/22 with repaired MV, mean gradient 8 mmHg (mild-moderate MS), mild Jason.  - TEE in 8/23 with MV repair with mean gradient 10 mmHg but MVA 2.19 cm^2 (leaflets open well), trivial Jason. 13. CKD: Stage III.  14. Atrial fibrillation/atrial flutter: Both arrhythmias noted in 12/18.  DCCV 12/19.   - DCCV to NSR 1/21.  - DCCV to NSR 2/21.  - DCCV to NSR in 8/23 - DCCV to NSR  7/24 15. AKI 6/19 => CKD stage 4: uncertain etiology.  Renal US was normal.  16. Gout 17. COVID-19 infection 9/21 18. 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  - Carotid dopplers (6/22) with mild disease only.  19. Nephrolithiasis.  20. Atrial tachycardia: Noted on 3/22 Zio patch.  21. Aortic stenosis: mild-moderate on 2/22 echo but no significant stenosis on 8/22 echo.  - No AS on TEE in 8/23.  22. 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  TEE 7/22 with oscillating density attached to the mitral annulus, ?suture.  However, could not rule out vegetation so had 6 wks of IV abx for SBE.  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  Lives in [place].   Family History  Problem Relation Age of Onset   Other Mother        joint problems   Alzheimer's disease Mother    Hypertension Father    Heart disease Father    CVA Father        age 58   Depression Father    Heart disease Sister        CABG   Stroke Sister    Heart failure Sister        congestive   Diabetes Sister    Heart disease Brother    Hypertension Brother    Congestive Heart Failure Brother     ROS: All systems reviewed and negative except as per HPI.   Current Outpatient Medications  Medication Sig Dispense Refill   Alirocumab (PRALUENT) 75 MG/ML SOAJ Inject 75 mg into the skin every 14 (fourteen) days.     allopurinol (ZYLOPRIM) 100 MG tablet Take 50 mg by mouth 2 (two) times a week.     amiodarone (PACERONE) 200 MG tablet Take 1 tablet (200 mg total) by mouth in the morning.     apixaban (ELIQUIS) 5 MG TABS tablet Take 1 tablet (5 mg total) by mouth 2 (two) times daily. 180 tablet 3   busPIRone (BUSPAR) 10 MG tablet Take 5 mg by mouth 2 (two) times daily.     cetirizine (ZYRTEC) 10 MG tablet Take 10 mg by mouth in the morning.     Cyanocobalamin (VITAMIN B-12) 5000 MCG TBDP Take 5,000 mcg by mouth 2 (two) times a week.  cycloSPORINE (RESTASIS) 0.05 %  ophthalmic emulsion Place 1 drop into both eyes 2 (two) times daily as needed (eye irritation.).     diclofenac Sodium (VOLTAREN) 1 % GEL Apply 2 g topically 3 (three) times daily as needed for pain.     docusate sodium (COLACE) 100 MG capsule Take 200 mg by mouth at bedtime.     ezetimibe (ZETIA) 10 MG tablet Take 1 tablet (10 mg total) by mouth daily. 90 tablet 3   ferrous sulfate 325 (65 FE) MG tablet Take 325 mg by mouth every other day.     fluticasone (FLONASE) 50 MCG/ACT nasal spray Place 1 spray into both nostrils daily as needed for allergies or rhinitis.     furosemide (LASIX) 40 MG tablet Take 2 tablets (80 mg total) by mouth 2 (two) times daily. 120 tablet 3   HYDROcodone-acetaminophen (NORCO) 10-325 MG tablet Take 1 tablet by mouth 3 (three) times daily as needed for moderate pain.     Multiple Vitamin (MULTIVITAMIN) capsule Take 1 capsule by mouth in the morning.     ONETOUCH VERIO test strip 1 each daily.     pantoprazole (PROTONIX) 40 MG tablet Take 40 mg by mouth daily as needed (heartburn).     potassium chloride SA (KLOR-CON M) 20 MEQ tablet Take 2 tablets (40 mEq total) by mouth daily. 60 tablet 7   Semaglutide,0.25 or 0.5MG /DOS, 2 MG/3ML SOPN Inject 0.5 mg into the skin once a week. 9 mL 3   testosterone cypionate (DEPOTESTOSTERONE CYPIONATE) 200 MG/ML injection Inject 200 mg into the muscle once a week.     traZODone (DESYREL) 100 MG tablet Take 100 mg by mouth at bedtime.     albuterol (VENTOLIN HFA) 108 (90 Base) MCG/ACT inhaler Inhale 1-2 puffs into the lungs every 6 (six) hours as needed for shortness of breath or wheezing. (Patient not taking: Reported on 04/15/2023)     colchicine 0.6 MG tablet Take 0.6-1.2 mg by mouth 2 (two) times daily as needed (gout).  (Patient not taking: Reported on 04/15/2023)     Meclizine HCl 25 MG CHEW Chew 25 mg by mouth 3 (three) times daily as needed (dizziness). (Patient not taking: Reported on 04/15/2023)     methocarbamol (ROBAXIN) 500 MG  tablet Take 500 mg by mouth 3 (three) times daily as needed for muscle spasms. (Patient not taking: Reported on 04/15/2023)     nitroGLYCERIN (NITROSTAT) 0.4 MG SL tablet 1 TAB UNDER THE TONGUE EVERY 5 MINUTES AS NEEDED FOR CHEST PAIN UP TO 3 DOSES, IF PERSIST CALL 911 (Patient not taking: Reported on 04/15/2023) 25 tablet 3   tadalafil (CIALIS) 20 MG tablet Take 20 mg by mouth daily as needed for erectile dysfunction. (Patient not taking: Reported on 04/15/2023)     No current facility-administered medications for this encounter.   BP 122/80   Pulse 64   Ht 5\' 10"  (1.778 m)   Wt 91.7 kg (202 lb 3.2 oz)   SpO2 99%   BMI 29.01 kg/m    Wt Readings from Last 3 Encounters:  04/15/23 91.7 kg (202 lb 3.2 oz)  04/08/23 92.4 kg (203 lb 9.6 oz)  03/30/23 87.2 kg (192 lb 3.2 oz)   PHYSICAL EXAM: General:  Walked in the clinic. Well appearing. No resp difficulty HEENT: normal Neck: supple. JVP 7-8 . Carotids 2+ bilat; no bruits. No lymphadenopathy or thryomegaly appreciated. Cor: PMI nondisplaced. Irreegular rate & rhythm. No rubs, gallops or murmurs. Lungs: clear Abdomen: soft, nontender, nondistended.  No hepatosplenomegaly. No bruits or masses. Good bowel sounds. Extremities: no cyanosis, clubbing, rash, edemal RUE  Neuro: alert & orientedx3, cranial nerves grossly intact. moves all 4 extremities w/o difficulty. Affect pleasant  Assessment/Plan: 1. Chronic, Primarily diastolic CHF:  TEE in 8/23 with EF 55%, mild RV enlargement with normal RV function.  Echo 7/24 EF 55-60%. RV mildly reduced.  CHF is worsened by CKD stage IV and persistent AF.  - NYHA III. Reds Clip down from 51-->43%. I think is this is a good as we can get him. On exam he does not appear volume overloaded.  - -Continue lasix 80 mg bid  - GDMT limited by CKD  - Discussed limiting fluid intake and low sodium food choices.  2. S/p mitral valve repair:  Echo in 8/22 showed mild-moderate stenosis with mean gradient 8 mmHg and mild  Jason.  TEE in 8/23 showed MV repair with mean gradient 10 mmHg but MVA 2.19 cm^2 (leaflets open well), trivial Jason.  Echo 7/24 mild Jason, mild MS, mG 7.0   - With prosthetic material, should use antibiotic prophylaxis with dental work.  3. Persistent Atrial Fibrillation  - s/p MAZE w/ recurrence.  Failed 2 recent back to back cardioversions, despite amiodarone. H/o bradycardia limits further amio titration. Followed by EP.  Given severe LAE, suspect the chance of durable sinus rhythm is low. Doubt ablation would be beneficial. Focus will be rate control for now.    - Rate controlled. . - Continue amiodarone 200 mg daily. Normal LFTs, TSH 6/24.  He will need a regular eye exam.  - Continue apixaban 5 mg bid.  - He has follow up with Dr Nelly Laurence today.  4. CAD:  He has severe stenosis in a distal D1 that is not amenable to intervention.  This has been stable on last couple of caths. He has occluded RCA and LCx known from the past with SVGs to OM and PDA.  Echo in 2/22 with EF 45-50%. Cardiolite in 11/19 showed no ischemia. Echo in 8/22 with EF up to 55-60%, TEE in 8/23 with EF 55%.  - No chest pain.  - Currently not on ASA, taking apixaban.  - He cannot tolerate statins with myalgias, Now on Praluent.    5. Hyperlipidemia: Myalgias with Crestor, pravastatin, and atorvastatin.  We were finally able to get him Praluent through the [place]. He is also on Zetia.  - Good lipids 6/24. 6. OSA: Compliant w/ CPAP.  7. CKD stage 4: AKI without recovery, uncertain etiology.  He sees nephrology, there is concern that he is progressing towards HD. He now has AV fistula. Recent creatinine 4.27.   He has follow up with Dr Nelly Laurence next week and will get lab work tomorrow.  8. Diabetes: He is now followed by endocrinology. 9. Atrial tachycardia: Paroxysmal.  Seen by Dr. Johney Frame, medical management planned.  Continue amiodarone.  10. CVA: Though to be atrial fibrillation-related.  Carotid dopplers 6/22 with mild stenosis only.  However, he was also noted on 8/22 echo to have a PFO.  - Continue on Eliquis.  11. Enterococcal bacteremia: In 7/22.  On TEE, it was hard to differentiate between loose suture material and small vegetation.  He was treated for 6 wks with IV abx.   Follow up with Dr Shirlee Latch in 6-8 weeks.   Jason Mower NP-C  04/15/2023

## 2023-04-14 NOTE — Telephone Encounter (Signed)
Lvm to move appt time from 2 to 11 am 8/22

## 2023-04-15 ENCOUNTER — Encounter (HOSPITAL_COMMUNITY): Payer: Self-pay

## 2023-04-15 ENCOUNTER — Encounter (HOSPITAL_COMMUNITY): Payer: No Typology Code available for payment source

## 2023-04-15 ENCOUNTER — Ambulatory Visit
Admission: RE | Admit: 2023-04-15 | Discharge: 2023-04-15 | Disposition: A | Discharge status: Home / Self Care | Payer: No Typology Code available for payment source | Source: Ambulatory Visit | Attending: Adult Health | Admitting: Adult Health

## 2023-04-15 ENCOUNTER — Ambulatory Visit: Payer: No Typology Code available for payment source | Admitting: Cardiovascular Disease

## 2023-04-15 VITALS — BP 122/80 | HR 64 | Ht 70.0 in | Wt 202.2 lb

## 2023-04-15 DIAGNOSIS — Z952 Presence of prosthetic heart valve: Secondary | ICD-10-CM | POA: Insufficient documentation

## 2023-04-15 DIAGNOSIS — Z7901 Long term (current) use of anticoagulants: Secondary | ICD-10-CM | POA: Diagnosis not present

## 2023-04-15 DIAGNOSIS — I5032 Chronic diastolic (congestive) heart failure: Secondary | ICD-10-CM | POA: Diagnosis not present

## 2023-04-15 DIAGNOSIS — I4819 Other persistent atrial fibrillation: Secondary | ICD-10-CM | POA: Insufficient documentation

## 2023-04-15 DIAGNOSIS — E1122 Type 2 diabetes mellitus with diabetic chronic kidney disease: Secondary | ICD-10-CM | POA: Insufficient documentation

## 2023-04-15 DIAGNOSIS — Z8673 Personal history of transient ischemic attack (TIA), and cerebral infarction without residual deficits: Secondary | ICD-10-CM | POA: Diagnosis not present

## 2023-04-15 DIAGNOSIS — E785 Hyperlipidemia, unspecified: Secondary | ICD-10-CM | POA: Insufficient documentation

## 2023-04-15 DIAGNOSIS — G4733 Obstructive sleep apnea (adult) (pediatric): Secondary | ICD-10-CM | POA: Insufficient documentation

## 2023-04-15 DIAGNOSIS — Z951 Presence of aortocoronary bypass graft: Secondary | ICD-10-CM | POA: Insufficient documentation

## 2023-04-15 DIAGNOSIS — I2510 Atherosclerotic heart disease of native coronary artery without angina pectoris: Secondary | ICD-10-CM | POA: Diagnosis not present

## 2023-04-15 DIAGNOSIS — I130 Hypertensive heart and chronic kidney disease with heart failure and stage 1 through stage 4 chronic kidney disease, or unspecified chronic kidney disease: Secondary | ICD-10-CM | POA: Diagnosis not present

## 2023-04-15 DIAGNOSIS — N184 Chronic kidney disease, stage 4 (severe): Secondary | ICD-10-CM | POA: Insufficient documentation

## 2023-04-15 NOTE — Progress Notes (Signed)
ReDS Vest / Clip - 04/15/23 1005       ReDS Vest / Clip   Station Marker D    Ruler Value 33    ReDS Value Range High volume overload    ReDS Actual Value 43

## 2023-04-15 NOTE — Patient Instructions (Signed)
No change in medications. Return to see Dr. Shirlee Latch in 6 - 8 weeks. See below. Please call us at [phone] if any questions or concerns prior to your next visit.

## 2023-05-05 ENCOUNTER — Other Ambulatory Visit: Payer: Self-pay

## 2023-05-05 MED ORDER — ONETOUCH VERIO VI STRP: 1.0000 | ORAL_STRIP | Freq: Two times a day (BID) | 5 refills | Status: AC

## 2023-05-07 ENCOUNTER — Ambulatory Visit: Payer: Medicare Other | Admitting: Cardiovascular Disease

## 2023-05-21 ENCOUNTER — Ambulatory Visit
Payer: No Typology Code available for payment source | Attending: Cardiovascular Disease | Admitting: Cardiovascular Disease

## 2023-05-21 ENCOUNTER — Encounter: Payer: Self-pay | Admitting: Cardiovascular Disease

## 2023-05-21 VITALS — BP 146/78 | HR 75 | Ht 70.0 in | Wt 199.2 lb

## 2023-05-21 DIAGNOSIS — I4819 Other persistent atrial fibrillation: Secondary | ICD-10-CM | POA: Diagnosis not present

## 2023-05-21 MED ORDER — METOPROLOL SUCCINATE ER 50 MG PO TB24: 50.0000 mg | ORAL_TABLET | Freq: Every day | ORAL | 3 refills | Status: DC

## 2023-05-21 NOTE — Patient Instructions (Signed)
Medication Instructions:  Your physician has recommended you make the following change in your medication:  1) STOP taking amiodarone 2) START taking metoprolol succinate (Toprol XL) 50 mg daily  *If you need a refill on your cardiac medications before your next appointment, please call your pharmacy*  Follow-Up: At [facility], you and your health needs are our priority.  As part of our continuing mission to provide you with exceptional heart care, we have created designated Provider Care Teams.  These Care Teams include your primary Cardiologist (physician) and Advanced Practice Providers (APPs -  Physician Assistants and Nurse Practitioners) who all work together to provide you with the care you need, when you need it.  Your next appointment:   As needed with Dr. Nelly Laurence

## 2023-05-21 NOTE — Progress Notes (Signed)
Electrophysiology Office Note:    Date:  05/21/2023   ID:  [name], DOB October 17, 1945, MRN [id number]  PCP:  Daisy Floro, MD   [facility] Providers Cardiologist:  Marca Ancona, MD Electrophysiologist:  Maurice Small, MD  Advanced Heart Failure:  Marca Ancona, MD     Referring MD: Daisy Floro, MD   History of Present Illness:    [name] is a 77 y.o. male with a medical history significant for coronary artery disease status post CABG, obesity status post lap band, mitral regurgitation status post repair and maze procedure in 2017 referred for management of atrial fibrillation.     Patient's arrhythmia history dates back to December 2018.  Noted to have both atrial fibrillation and flutter.  He saw Dr. Johney Frame, who recommended DC cardioversion with medical therapy if he has recurrence.  Follow-up with Dr. Johney Frame in 2022, the patient was on amiodarone.  He has had DC cardioversions in 2019, January 2021, February 2021, August 2023, now two [facility] 2024.          Since his visit with me, he has had another cardioversion with early recurrence of atrial fibrillation.  He continues to have some fatigue and shortness of breath.  EKGs/Labs/Other Studies Reviewed Today:    Echocardiogram:  TEE August 2023 EF 55%, left atrium moderately dilated, right atrium moderately dilated  TTE August 2022 Ejection fraction 55 to 60%, severely dilated left atrium.  Mild to moderate mitral stenosis  TTE July 2024 EF 55%, left atrium severely dilated, right atrium moderately dilated.  There is mild mitral regurgitation   Monitors:  14-day ZIO - 02/11/2023 Device read - 96% atrial fibrillation burden, heart rate range 44 to the 144 bpm, average 65 bpm.  I think much of what is interpreted as atrial fibrillation is actually a sinus rhythm with PACs and low atrial amplitude.  Stress testing:   Advanced imaging:   Cardiac catherization   EKG:    EKG Interpretation Date/Time:  Friday May 21 2023 14:03:25 EDT Ventricular Rate:  75 PR Interval:    QRS Duration:  160 QT Interval:  456 QTC Calculation: 509 R Axis:   -61  Text Interpretation: Atrial fibrillation Right bundle branch block Left anterior fascicular block Bifascicular block When compared with ECG of 08-Apr-2023 09:38, Current undetermined rhythm precludes rhythm comparison, needs review Confirmed by York Pellant [phone]) on 05/21/2023 2:11:10 PM     Physical Exam:    VS:  BP (!) 146/78   Pulse 75   Ht 5\' 10"  (1.778 m)   Wt 199 lb 3.2 oz (90.4 kg)   SpO2 97%   BMI 28.58 kg/m     Wt Readings from Last 3 Encounters:  05/21/23 199 lb 3.2 oz (90.4 kg)  04/15/23 202 lb 3.2 oz (91.7 kg)  04/08/23 203 lb 9.6 oz (92.4 kg)     GEN: Well nourished, well developed in no acute distress CARDIAC: RRR, no murmurs, rubs, gallops RESPIRATORY:  Normal work of breathing MUSCULOSKELETAL: trace edema    ASSESSMENT & PLAN:    Persistent atrial fibrillation - now permanent Status post Maze procedure in 2017, long-term amiodarone use Status post multiple cardioversions -- he feels much better after the recent cardioversion Left atrium severely enlarged in the past, moderately large most recent TEE Low P wave amplitude I suspect the chance of durable sinus rhythm is low, and ablation will be very difficult given his history of mitral valve surgery and Maze procedure --  I do not think he is a good candidate for ablation Will continue a rate control approach -- Dc amiodarone, start metoprolol XL 50 He may continue rate control efforts in general cardiology clinic  History of mitral valve prolapse status post mitral valve repair Mean gradient 10 mmHg on TEE August 2023 Limited substructure, possible suture noted echocardiogram the past  Coronary artery disease Status post CABG with occluded RCA and left circumflex grafts Severe stenosis of distal to D1 not amenable  to intervention. Most recent stress test 2019 showed no ischemia  Congestive heart failure, predominantly with preserved ejection fraction Certainly exacerbated by atrial fibrillation  Atrial tachycardia Has been managed with amiodarone  Presence of PFO   History of mitral valve prolapse status post mitral valve repair   Signed, Maurice Small, MD  05/21/2023 2:11 PM    [facility]

## 2023-05-24 ENCOUNTER — Encounter (HOSPITAL_COMMUNITY): Payer: Self-pay | Admitting: Cardiology

## 2023-06-08 ENCOUNTER — Ambulatory Visit: Payer: Medicare Other | Admitting: Diagnostic Neuroimaging

## 2023-06-09 ENCOUNTER — Encounter (HOSPITAL_COMMUNITY): Payer: Self-pay | Admitting: Cardiology

## 2023-06-09 ENCOUNTER — Ambulatory Visit (HOSPITAL_COMMUNITY)
Admission: RE | Admit: 2023-06-09 | Discharge: 2023-06-09 | Disposition: A | Discharge status: Home / Self Care | Payer: No Typology Code available for payment source | Source: Ambulatory Visit | Attending: Cardiology | Admitting: Cardiology

## 2023-06-09 VITALS — BP 128/70 | HR 52 | Wt 196.4 lb

## 2023-06-09 DIAGNOSIS — Z8673 Personal history of transient ischemic attack (TIA), and cerebral infarction without residual deficits: Secondary | ICD-10-CM | POA: Diagnosis not present

## 2023-06-09 DIAGNOSIS — R9431 Abnormal electrocardiogram [ECG] [EKG]: Secondary | ICD-10-CM | POA: Insufficient documentation

## 2023-06-09 DIAGNOSIS — I130 Hypertensive heart and chronic kidney disease with heart failure and stage 1 through stage 4 chronic kidney disease, or unspecified chronic kidney disease: Secondary | ICD-10-CM | POA: Diagnosis not present

## 2023-06-09 DIAGNOSIS — I4819 Other persistent atrial fibrillation: Secondary | ICD-10-CM | POA: Insufficient documentation

## 2023-06-09 DIAGNOSIS — Z7901 Long term (current) use of anticoagulants: Secondary | ICD-10-CM | POA: Insufficient documentation

## 2023-06-09 DIAGNOSIS — E785 Hyperlipidemia, unspecified: Secondary | ICD-10-CM | POA: Insufficient documentation

## 2023-06-09 DIAGNOSIS — N184 Chronic kidney disease, stage 4 (severe): Secondary | ICD-10-CM | POA: Insufficient documentation

## 2023-06-09 DIAGNOSIS — Z951 Presence of aortocoronary bypass graft: Secondary | ICD-10-CM | POA: Diagnosis not present

## 2023-06-09 DIAGNOSIS — I5032 Chronic diastolic (congestive) heart failure: Secondary | ICD-10-CM | POA: Diagnosis not present

## 2023-06-09 DIAGNOSIS — I2510 Atherosclerotic heart disease of native coronary artery without angina pectoris: Secondary | ICD-10-CM | POA: Diagnosis not present

## 2023-06-09 DIAGNOSIS — Z952 Presence of prosthetic heart valve: Secondary | ICD-10-CM | POA: Diagnosis not present

## 2023-06-09 DIAGNOSIS — G4733 Obstructive sleep apnea (adult) (pediatric): Secondary | ICD-10-CM | POA: Diagnosis not present

## 2023-06-09 LAB — BASIC METABOLIC PANEL
Anion gap: 11 (ref 5–15)
BUN: 57 mg/dL — ABNORMAL HIGH (ref 8–23)
CO2: 25 mmol/L (ref 22–32)
Calcium: 9.6 mg/dL (ref 8.9–10.3)
Chloride: 104 mmol/L (ref 98–111)
Creatinine, Ser: 4.78 mg/dL — ABNORMAL HIGH (ref 0.61–1.24)
GFR, Estimated: 12 mL/min — ABNORMAL LOW (ref >60)
Glucose, Bld: 155 mg/dL — ABNORMAL HIGH (ref 70–99)
Potassium: 3.8 mmol/L (ref 3.5–5.1)
Sodium: 140 mmol/L (ref 135–145)

## 2023-06-09 LAB — BRAIN NATRIURETIC PEPTIDE: B Natriuretic Peptide: 402.7 pg/mL — ABNORMAL HIGH (ref 0.0–100.0)

## 2023-06-09 MED ORDER — FUROSEMIDE 40 MG PO TABS: 120.0000 mg | ORAL_TABLET | Freq: Two times a day (BID) | ORAL | 3 refills | Status: DC

## 2023-06-09 MED ORDER — METOPROLOL SUCCINATE ER 50 MG PO TB24: 25.0000 mg | ORAL_TABLET | Freq: Every day | ORAL | Status: DC

## 2023-06-09 NOTE — Patient Instructions (Addendum)
Medication Changes:  DECREASE Toprol XL to 25 mg (1/2 tab) Daily, after 1 week STOP if HR <70, if HR remains under 90 ok to stay off   Increase lasix to 120 mg Twice daily  Lab Work:  Labs done today, your results will be available in MyChart, we will contact you for abnormal readings.   Testing/Procedures:  none  Referrals:  none  Special Instructions // Education:  Do the following things EVERYDAY: Weigh yourself in the morning before breakfast. Write it down and keep it in a log. Take your medicines as prescribed Eat low salt foods--Limit salt (sodium) to 2000 mg per day.  Stay as active as you can everyday Limit all fluids for the day to less than 2 liters   Follow-Up in: 3 months   At the [facility], you and your health needs are our priority. We have a designated team specialized in the treatment of Heart Failure. This Care Team includes your primary Heart Failure Specialized Cardiologist (physician), Advanced Practice Providers (APPs- Physician Assistants and Nurse Practitioners), and Pharmacist who all work together to provide you with the care you need, when you need it.   You may see any of the following providers on your designated Care Team at your next follow up:  Dr. Arvilla Meres Dr. Marca Ancona Dr. Dorthula Nettles Dr. Theresia Bough Tonye Becket, NP Robbie Lis, [place] [facility] [place], [place] Brynda Peon, NP [place] Lee, NP Karle Plumber, PharmD   Please be sure to bring in all your medications bottles to every appointment.   Need to Contact us:  If you have any questions or concerns before your next appointment please send [place] a message through [place] or call our office at [phone].    TO LEAVE A MESSAGE FOR THE NURSE SELECT OPTION 2, PLEASE LEAVE A MESSAGE INCLUDING: YOUR NAME DATE OF BIRTH CALL BACK NUMBER REASON FOR CALL**this is important as we prioritize the call backs  YOU WILL RECEIVE A CALL BACK THE  SAME DAY AS LONG AS YOU CALL BEFORE 4:00 PM

## 2023-06-09 NOTE — Progress Notes (Signed)
Patient ID: [name], male   DOB: 01-Aug-1946, 77 y.o.   MRN: [id number] PCP: Dr Tenny Craw Cardiology: Dr. Shirlee Latch Nephrology: Dr Glenna Fellows  EP: Dr Nelly Laurence  [name] is a 77 y.o. with history of CAD s/p CABG and obesity s/p lap banding procedure/  Given exertional dyspnea and chest tightness, he had LHC in 1/15.  This showed no change from prior.  Grafts were patent and there was severe stenosis in distal D1, not amenable to PCI.   In 2017, he developed increased exertional dyspnea.  Eventually had a TEE showing severe Jason with prolapse of a portion of the anterior mitral valve leaflet.  Coronary angiography in 6/17 showed patent grafts and 90% stenosis of distal diagonal not amenable to intervention.  In 8/17, he had mitral valve repair.  Post-op diastolic CHF, Lasix started and increased.   He had a cath again in 9/18 showing stable coronary anatomy.    He developed atrial arrhythmias in 12/18.  Both fibrillation and flutter were seen.  Saw Dr. Johney Frame, recommended DCCV with amiodarone or sotalol if fibrillation recurs.  He had DCCV in 1/19.   In 6/19, he was noted to have developed AKI. Creatinine went up to 3.46.  Renal US was done, showing normal kidneys. Cause of AKI was not clear.  We stopped his Lasix. He saw nephrology and eventually went back on Lasix, currently taking 80 mg daily.  He is not using any NSAIDs.    He stopped pravastatin due to myalgias. We struggled to get him on a PCSK9 inhibitor through the [place] but he was finally able to get Praluent.    He had a Lexiscan Cardiolite in 11/19, which showed old inferior MI with no ischemia.  Echo in 11/19 showed EF 55-60%, s/p MV repair with mean gradient 6 mmHg. Echo 12/20 showed EF 50-55%, mildly decreased RV systolic function, repaired mitral valve with mild Jason, mean gradient 6 mmHg across MV.    He had DCCV from atrial fibrillation to NSR in 1/21 and again in 2/21.   At last appointment, he reported significant fatigue and dyspnea in  setting of weight loss with low BP and HR.  I stopped his Coreg. He felt better with that change.    In 9/21, he had COVID-19 infection but was not hospitalized.   Zio patch in 3/22 showed frequent short atrial tachycardia episodes.  He saw Dr. Johney Frame, medical therapy recommended (on amiodarone).   He was admitted in 5/22 with AKI from obstruction of right ureter by stone, he was treated by urology.   He was admitted in 6/22 with right-sided weakness and was found to have left cerebral white matter CVA.  He had been compliant with Eliquis, ASA 81 was added.   He was admitted in 7/22 with Enterococcal bacteremia.  TEE showed oscillating density attached to the mitral annulus, ?suture.  However, could not rule out vegetation so had 6 wks of IV abx for SBE.   Echo in 8/22 showed EF 55-60%, moderate focal basal septal hypertrophy, normal RV, severe LAE, s/p mitral valve repair with mean gradient 8 mmHg (mild to moderate mitral stenosis) and mild Jason, large PFO, normal IVC.   Patient was noted to re-develop atrial fibrillation.  TEE was done in 8/23, LV EF was 55% with mild RV enlargement and normal RV function, s/p MV repair with mean gradient 10 mmHg but MVA 2.19 cm^2 (leaflets open well), trivial Jason.  He was cardioverted back to NSR.   Follow up  6/24, he was in rate controlled AF, for about a month. Zio monitor in 6/24 showed continuous AF. Endorsed worsening NYHA III symptoms and volume overloaded. Lasix increased to 80/40. Arranged for DCCV on 02/22/23, successful to NSR.  Unfortunately, at return f/u he was back in AF and felt poor.  More fatigued and worn out but not markedly short of breath.  ReDs 33%. He was arranged for repeat DCCV, done on 03/12/23. Converted back to NSR. He again went back into AF despite amiodarone use.   Seen by EP, not candidate for ablation, though AF was now permanent and stopped amiodarone/started Toprol XL.   Today he returns for HF follow up. He feels exhausted since  starting Toprol XL.  He felt best after stopping amiodarone and before he started Toprol XL.  He is short of breath walking up hills, inclines.  No orthopnea/PND.  No chest pain. No lightheadedness.  Weight down 6 lbs.   REDS clip: 48%  ECG (personally reviewed): atrial fibrillation, RBBB, LAFB  Labs (1/19): K 4.9, creatinine 1.76, hgb 14 Labs (6/19): K 3.6, creatinine 3.46 Labs (7/19): K 4.1, creatinine 3.66, hgb 12.7 Labs (10/19): LDL 177, hgb 13.4, troponin negative, K 4, creatinine 4.15 Labs (12/19): K 4.3, creatinine 3.88 Labs (4/20): K 3.6, creatinine 3.39 Labs (11/20): LDL 73, HDL 38 Labs (2/21): K 3.4, creatinine 3.8 Labs (7/21): K 3.7, creatinine 3.66 Labs (6/22): K 4, creatinine 2.88, LDL 50 Labs (8/22): K 3.6, creatinine 2.79 Labs (1/23): K 4.3, creatinine 3.21 Labs (4/23): LDL 100 Labs (8/23): K 3.5, creatinine 4.56, hgb 10.3 Labs (4/24): K 3.3, creatinine 4.22, LFTs normal, TSH normal Labs (6/24): K 4.0, creatinine 4.32, LFTs normal, TSH normal, LDL 23, HDL 46 Labs (7/24): K 3.5, creatinine 4.42 Labs (04/08/23): K 3.5 Creatinine 4.27, BNP 294  PMH: 1. OSA: on CPAP.  2. Diabetes mellitus 3. Allergic rhinitis 4. CAD: s/p CABG 1995.  LHC (10/11) with 90% distal D1 (patent proximal D1 stent), total occlusion CFX, total occlusion RCA, no significant stenoses in LAD, SVG-PDA patent, SVG-OM patent, EF 55%.  Medical management.  LHC (5/13) with patent grafts and 90% distal D1 stenosis (no significant change from 10/11).  LHC (1/15) with patent grafts, 75% ISR in D1 stent, 90% stenosis distal D1 (small caliber). D1 not amenable to intervention.  - Coronary angiography (6/17): grafts patent, 90% stenosis distally in large diagonal not amenable to intervention.   - LHC (9/18): SVG-OM and SVG-PDA patent,  75% in-stent restenosis in proximal diagonal then 90% stenosis distally (small in caliber at that point), totally occluded LCx and RCA.  Left coronary system similar to prior caths,  D1 may be source of angina.  - Lexiscan Cardiolite (11/19): EF 47%, old inferior MI, no ischemia.  5. Obesity: lap-banding in 5/11.  6. Chronic diastolic CHF: Echo (8/17) with EF 55-60%, moderate LVH, moderate diastolic dysfunction, s/p mitral valve repair with elevated mean gradient but normal pressure half-time.  - Echo (8/18): EF 55-60%, s/p MV repair with mean gradient 6 mmHg, PASP 28 mmHg, mildly decreased RV systolic function.  - Echo (11/19): EF 55-60%, s/p MV repair with mean gradient 6, PASP 42 mmHg, mildly dilated RV, dilated IVC.  - Echo (12/20): EF 50-55%, mildly decreased RV systolic function. S/p MV repair with mean gradient 6 mmHg, mild Jason.  - Echo (2/22): EF 45-50%, mild LVH, moderate RV enlargement with low normal systolic function, s/p mitral valve repair with mean gradient 5 mmHg and trivial Jason, mild-moderate AS.  -  Echo (8/22): EF 55-60%, moderate focal basal septal hypertrophy, normal RV, severe LAE, s/p mitral valve repair with mean gradient 8 mmHg (mild to moderate mitral stenosis) and mild Jason, large PFO, normal IVC. - TEE (8/23): LV EF was 55% with mild RV enlargement and normal RV function, s/p MV repair with mean gradient 10 mmHg but MVA 2.19 cm^2 (leaflets open well), trivial Jason. 7. PFO: noted on 8/22 echo.  8. OA: Knees, C-spine. TKR 9/13.  9. HTN 10. Hyperlipidemia: Myalgias with atorvastatin and Crestor. Now on Praluent.  11. Diverticulosis 12. Mitral regurgitation: TEE (6/17) with EF 55-60%, severe eccentric Jason, prolapse of a portion of the anterior leaflet, peak RV-RA gradient 45 mmHg.  - Mitral valve repair 8/17 - Echo in 8/22 with repaired MV, mean gradient 8 mmHg (mild-moderate MS), mild Jason.  - TEE in 8/23 with MV repair with mean gradient 10 mmHg but MVA 2.19 cm^2 (leaflets open well), trivial Jason. 13. CKD: Stage III.  14. Atrial fibrillation/atrial flutter: Both arrhythmias noted in 12/18.  DCCV 12/19.   - DCCV to NSR 1/21.  - DCCV to NSR 2/21.  - DCCV to  NSR in 8/23 - DCCV to NSR 7/24 - Now permanent 15. AKI 6/19 => CKD stage 4: uncertain etiology.  Renal US was normal.  16. Gout 17. COVID-19 infection 9/21 18. 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  - Carotid dopplers (6/22) with mild disease only.  19. Nephrolithiasis.  20. Atrial tachycardia: Noted on 3/22 Zio patch.  21. Aortic stenosis: mild-moderate on 2/22 echo but no significant stenosis on 8/22 echo.  - No AS on TEE in 8/23.  22. 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  TEE 7/22 with oscillating density attached to the mitral annulus, ?suture.  However, could not rule out vegetation so had 6 wks of IV abx for SBE.  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  Lives in [place].   Family History  Problem Relation Age of Onset   Other Mother        joint problems   Alzheimer's disease Mother    Hypertension Father    Heart disease Father    CVA Father        age 33   Depression Father    Heart disease Sister        CABG   Stroke Sister    Heart failure Sister        congestive   Diabetes Sister    Heart disease Brother    Hypertension Brother    Congestive Heart Failure Brother     ROS: All systems reviewed and negative except as per HPI.   Current Outpatient Medications  Medication Sig Dispense Refill   albuterol (VENTOLIN HFA) 108 (90 Base) MCG/ACT inhaler Inhale 1-2 puffs into the lungs every 6 (six) hours as needed for shortness of breath or wheezing.     Alirocumab (PRALUENT) 75 MG/ML SOAJ Inject 75 mg into the skin every 14 (fourteen) days.     allopurinol (ZYLOPRIM) 100 MG tablet Take 50 mg by mouth 2 (two) times a week.     apixaban (ELIQUIS) 5 MG TABS tablet Take 1 tablet (5 mg total) by mouth 2 (two) times daily. 180 tablet 3   busPIRone (BUSPAR) 10 MG tablet Take 5 mg by mouth 2 (two) times daily.     cetirizine (ZYRTEC) 10 MG tablet Take 10 mg by mouth in the morning.     colchicine 0.6 MG tablet  Take 0.6-1.2 mg by mouth 2 (two) times daily as needed (gout).     Cyanocobalamin (VITAMIN B-12) 5000 MCG TBDP Take 5,000 mcg by mouth 2 (two) times a week.     cycloSPORINE (RESTASIS) 0.05 % ophthalmic emulsion Place 1 drop into both eyes 2 (two) times daily as needed (eye irritation.).     diclofenac Sodium (VOLTAREN) 1 % GEL Apply 2 g topically 3 (three) times daily as needed for pain.     docusate sodium (COLACE) 100 MG capsule Take 200 mg by mouth at bedtime.     ezetimibe (ZETIA) 10 MG tablet Take 1 tablet (10 mg total) by mouth daily. 90 tablet 3   ferrous sulfate 325 (65 FE) MG tablet Take 325 mg by mouth every other day.     fluticasone (FLONASE) 50 MCG/ACT nasal spray Place 1 spray into both nostrils daily as needed for allergies or rhinitis.     HYDROcodone-acetaminophen (NORCO) 10-325 MG tablet Take 1 tablet by mouth 3 (three) times daily as needed for moderate pain.     Meclizine HCl 25 MG CHEW Chew 25 mg by mouth 3 (three) times daily as needed (dizziness).     methocarbamol (ROBAXIN) 500 MG tablet Take 500 mg by mouth 3 (three) times daily as needed for muscle spasms.     nitroGLYCERIN (NITROSTAT) 0.4 MG SL tablet 1 TAB UNDER THE TONGUE EVERY 5 MINUTES AS NEEDED FOR CHEST PAIN UP TO 3 DOSES, IF PERSIST CALL 911 25 tablet 3   ONETOUCH VERIO test strip 1 each by Other route in the morning and at bedtime. 200 each 5   pantoprazole (PROTONIX) 40 MG tablet Take 40 mg by mouth daily as needed (heartburn).     potassium chloride SA (KLOR-CON M) 20 MEQ tablet Take 20 mEq by mouth daily.     Semaglutide,0.25 or 0.5MG /DOS, 2 MG/3ML SOPN Inject 0.5 mg into the skin once a week. 9 mL 3   tadalafil (CIALIS) 20 MG tablet Take 20 mg by mouth daily as needed for erectile dysfunction.     testosterone cypionate (DEPOTESTOSTERONE CYPIONATE) 200 MG/ML injection Inject 200 mg into the muscle once a week.     traZODone (DESYREL) 100 MG tablet Take 100 mg by mouth at bedtime.     furosemide (LASIX) 40 MG  tablet Take 3 tablets (120 mg total) by mouth 2 (two) times daily. Please cancel all previous orders for current medication. Change in dosage or pill size. 180 tablet 3   metoprolol succinate (TOPROL-XL) 50 MG 24 hr tablet Take 0.5 tablets (25 mg total) by mouth daily. Take with or immediately following a meal.     Multiple Vitamin (MULTIVITAMIN) capsule Take 1 capsule by mouth in the morning. (Patient not taking: Reported on 06/09/2023)     No current facility-administered medications for this encounter.   BP 128/70   Pulse (!) 52   Wt 89.1 kg (196 lb 6.4 oz)   SpO2 99%   BMI 28.18 kg/m    Wt Readings from Last 3 Encounters:  06/09/23 89.1 kg (196 lb 6.4 oz)  05/21/23 90.4 kg (199 lb 3.2 oz)  04/15/23 91.7 kg (202 lb 3.2 oz)   PHYSICAL EXAM: General: NAD Neck: JVP 8 cm, no thyromegaly or thyroid nodule.  Lungs: Clear to auscultation bilaterally with normal respiratory effort. CV: Nondisplaced PMI.  Heart regular S1/S2, no S3/S4, no murmur.  No peripheral edema.  No carotid bruit.  Normal pedal pulses.  Abdomen: Soft, nontender, no hepatosplenomegaly, no  distention.  Skin: Intact without lesions or rashes.  Neurologic: Alert and oriented x 3.  Psych: Normal affect. Extremities: No clubbing or cyanosis.  HEENT: Normal.   Assessment/Plan: 1. Chronic diastolic CHF:  TEE in 8/23 with EF 55%, mild RV enlargement with normal RV function.  Echo 7/24 EF 55-60%. RV mildly reduced.  CHF is worsened by CKD stage IV and persistent AF. REDS clip 48% today, mild volume overload on exam, NYHA class III symptoms.   - Increase Lasix to 120 mg bid, BMET/BNP today and BMET in 10 days.  2. S/p mitral valve repair:  Echo in 8/22 showed mild-moderate stenosis with mean gradient 8 mmHg and mild Jason.  TEE in 8/23 showed MV repair with mean gradient 10 mmHg but MVA 2.19 cm^2 (leaflets open well), trivial Jason.  Echo 7/24 mild Jason, mild MS, mean gradient 7.0   - With prosthetic material, should use antibiotic  prophylaxis with dental work.  3. Atrial fibrillation: Now permanent. s/p MAZE w/ recurrence.  ERAF with back to back cardioversions in 7/24 despite amiodarone.  Saw EP, not thought to be candidate for ablation.  Therefore, amiodarone stopped and patient started on Toprol XL for rate control of atrial fibrillation.  Patient has felt marked fatigue since starting Toprol XL.  - Decrease Toprol XL to 25 mg daily.  If HR stays < 80 bpm after cutting back on Toprol XL, will stop it altogether.  He may not need a nodal blocker.  - Continue apixaban 5 mg bid.  4. CAD:  He had severe stenosis in a distal D1 that is not amenable to intervention.  This has been stable on last couple of caths. He has occluded RCA and LCx known from the past with SVGs to OM and PDA.  Echo in 2/22 with EF 45-50%. Cardiolite in 11/19 showed no ischemia. Echo in 8/22 with EF up to 55-60%, TEE in 8/23 with EF 55%.  No chest pain.  - Currently not on ASA, taking apixaban.  - He cannot tolerate statins with myalgias, Now on Praluent.    5. Hyperlipidemia: Myalgias with Crestor, pravastatin, and atorvastatin.  We were finally able to get him Praluent through the [place]. He is also on Zetia.  - Good lipids 6/24. 6. OSA: Compliant w/ CPAP.  7. CKD stage 4: AKI without recovery, uncertain etiology.  He sees nephrology, there is concern that he is progressing towards HD. He now has AV fistula. - BMET today.  8. Diabetes: He is now followed by endocrinology. 9. CVA: Though to be atrial fibrillation-related.  Carotid dopplers 6/22 with mild stenosis only. However, he was also noted on 8/22 echo to have a PFO.  - Continue on Eliquis.  10. Enterococcal bacteremia: In 7/22.  On TEE, it was hard to differentiate between loose suture material and small vegetation.  He was treated for 6 wks with IV abx.   Follow up in 3 months with APP  [name]  06/09/2023

## 2023-06-09 NOTE — Progress Notes (Signed)
ReDS Vest / Clip - 06/09/23 1100       ReDS Vest / Clip   Station Marker C    Ruler Value 31    ReDS Value Range High volume overload    ReDS Actual Value 48

## 2023-06-17 ENCOUNTER — Ambulatory Visit (HOSPITAL_COMMUNITY)
Admission: RE | Admit: 2023-06-17 | Discharge: 2023-06-17 | Disposition: A | Discharge status: Home / Self Care | Payer: Medicare Other | Source: Ambulatory Visit | Attending: Cardiology | Admitting: Cardiology

## 2023-06-17 ENCOUNTER — Telehealth (HOSPITAL_COMMUNITY): Payer: Self-pay

## 2023-06-17 DIAGNOSIS — I5032 Chronic diastolic (congestive) heart failure: Secondary | ICD-10-CM | POA: Insufficient documentation

## 2023-06-17 LAB — BASIC METABOLIC PANEL
Anion gap: 14 (ref 5–15)
BUN: 69 mg/dL — ABNORMAL HIGH (ref 8–23)
CO2: 22 mmol/L (ref 22–32)
Calcium: 9.7 mg/dL (ref 8.9–10.3)
Chloride: 102 mmol/L (ref 98–111)
Creatinine, Ser: 5.1 mg/dL — ABNORMAL HIGH (ref 0.61–1.24)
GFR, Estimated: 11 mL/min — ABNORMAL LOW (ref >60)
Glucose, Bld: 177 mg/dL — ABNORMAL HIGH (ref 70–99)
Potassium: 3.2 mmol/L — ABNORMAL LOW (ref 3.5–5.1)
Sodium: 138 mmol/L (ref 135–145)

## 2023-06-17 NOTE — Telephone Encounter (Addendum)
Reviewed with wife on high potassium foods to increase in his diet ----- Message from Marca Ancona sent at 06/17/2023 12:37 PM EDT ----- Creatinine higher.  K is low, I would like to avoid increasing his KCl, would have him increase K in diet.

## 2023-06-23 ENCOUNTER — Ambulatory Visit (HOSPITAL_COMMUNITY): Payer: Medicare Other | Admitting: Physician Assistant

## 2023-07-13 DIAGNOSIS — L814 Other melanin hyperpigmentation: Secondary | ICD-10-CM | POA: Diagnosis not present

## 2023-07-13 DIAGNOSIS — D692 Other nonthrombocytopenic purpura: Secondary | ICD-10-CM | POA: Diagnosis not present

## 2023-07-13 DIAGNOSIS — L905 Scar conditions and fibrosis of skin: Secondary | ICD-10-CM | POA: Diagnosis not present

## 2023-07-13 DIAGNOSIS — Z85828 Personal history of other malignant neoplasm of skin: Secondary | ICD-10-CM | POA: Diagnosis not present

## 2023-07-13 DIAGNOSIS — L570 Actinic keratosis: Secondary | ICD-10-CM | POA: Diagnosis not present

## 2023-07-13 DIAGNOSIS — L821 Other seborrheic keratosis: Secondary | ICD-10-CM | POA: Diagnosis not present

## 2023-07-15 ENCOUNTER — Ambulatory Visit (INDEPENDENT_AMBULATORY_CARE_PROVIDER_SITE_OTHER): Payer: Medicare Other | Admitting: Internal Medicine

## 2023-07-15 ENCOUNTER — Encounter: Payer: Self-pay | Admitting: Internal Medicine

## 2023-07-15 VITALS — BP 124/80 | HR 55 | Ht 70.0 in | Wt 192.2 lb

## 2023-07-15 DIAGNOSIS — E782 Mixed hyperlipidemia: Secondary | ICD-10-CM

## 2023-07-15 DIAGNOSIS — Z7985 Long-term (current) use of injectable non-insulin antidiabetic drugs: Secondary | ICD-10-CM

## 2023-07-15 DIAGNOSIS — E1159 Type 2 diabetes mellitus with other circulatory complications: Secondary | ICD-10-CM

## 2023-07-15 DIAGNOSIS — Z7984 Long term (current) use of oral hypoglycemic drugs: Secondary | ICD-10-CM

## 2023-07-15 DIAGNOSIS — E66811 Obesity, class 1: Secondary | ICD-10-CM

## 2023-07-15 LAB — POCT GLYCOSYLATED HEMOGLOBIN (HGB A1C): Hemoglobin A1C: 7.4 % — AB (ref 4.0–5.6)

## 2023-07-15 MED ORDER — OZEMPIC (1 MG/DOSE) 4 MG/3ML ~~LOC~~ SOPN: 1.0000 mg | PEN_INJECTOR | SUBCUTANEOUS | 3 refills | Status: DC

## 2023-07-15 MED ORDER — GLIPIZIDE 5 MG PO TABS
2.5000 mg | ORAL_TABLET | Freq: Every day | ORAL | 3 refills | Status: AC
Start: 2023-07-15 — End: ?

## 2023-07-15 NOTE — Patient Instructions (Addendum)
Please increase: - Ozempic 1 mg weekly  You can take Glipizide 2.5-5 mg before a larger dinner.  Please return in 4 months.

## 2023-07-15 NOTE — Progress Notes (Signed)
Patient ID: [name], male   DOB: 1946-03-16, 77 y.o.   MRN: [id number]   HPI: [name] is a 77 y.o.-year-old male, initially referred by his nephrologist, Dr. 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 fib, CKD stage V, cerebrovascular disease, Aortic atherosclerosis, s/p TIA, ED).  Last visit 6 months ago.  Interim history: No blurry vision, nausea, chest pain but has chronic shortness of breath. He had a cardioversion in 02/2023, which reportedly did not work.  Lasix was recently increased to 120 mg 2x a day. He was told he did not need dialysis yet. He has dietary indiscretions - mostly sweets -he eats them throughout the day.  Reviewed HbA1c levels: Lab Results  Component Value Date   HGBA1C 6.8 (A) 01/11/2023   HGBA1C 6.8 (A) 09/10/2022   HGBA1C 6.8 (H) 05/19/2022   HGBA1C 5.9 (A) 02/25/2022   HGBA1C 6.3 (A) 10/30/2021   HGBA1C 6.4 (A) 06/19/2021   HGBA1C 6.2 (H) 02/02/2021   HGBA1C 6.3 (H) 01/07/2021   HGBA1C 6.0 (H) 10/05/2020   HGBA1C 5.9 (A) 01/29/2020  09/24/2021: HbA1c 5.8% 01/11/2019: HbA1c 8.3% 10/04/2018: HbA1c 8.2%  He is currently on: - Ozempic 0.5 mg weekly >> stopped 04/2020 >> restarted 05/2020 - through the [place]. - (Glipizide 2.5 to 5 mg as needed before a  larger dinner) - not using He came off metformin due to worsening kidney function. We stopped glipizide 09/2020 due to good control.  Pt checks his sugars occasionally - a CGM was not approved for him since he was not on insulin -: - am: 89, 107-144, 155 >> 90-110, 120 >> 127,168 >> 105, 127-181 - 2h after b'fast:  90, 110 >> n/c >> 140 >> 153 >> n/c - before lunch: n/c >> 102-130 >> n/c - 2h after lunch: 120-130 >> n/c >> 134, 165 >> n/c >> 155 >> 133 - before dinner: 129 >> n/c >> 86 >> n/c - 2h after dinner: n/c >> 190 >> n/c  - bedtime: n/c >> 77-122 >> n/c >> 120 >>  157 - nighttime: n/c >> 180 (sick - steroids) Lowest sugar was 69 >> ... 89 >> 90 >> 115 >> 105; he has hypoglycemia awareness in the 70s. Highest sugar was 299 >> .Marland Kitchen. 155 >> 180 >> 168 >> 181 (pasta the night before)  Pt's meals are: - Breakfast: egg and bacon (not lately).   - Brunch: meat + veggies - Dinner: same as lunch - Snacks: crackers, popcorn   -He has stage V CKD-sees nephrology (Dr. Alphonzo Severance not have to start dialysis yet but has a fistula placed; last BUN/creatinine:  06/24/2023: 45/4.86, GFR 12, glucose 185. Lab Results  Component Value Date   BUN 69 (H) 06/17/2023   BUN 57 (H) 06/09/2023   CREATININE 5.10 (H) 06/17/2023   CREATININE 4.78 (H) 06/09/2023  He is not on ACE inhibitor/ARB.  -+ HL; last set of lipids: Lab Results  Component Value Date   CHOL 90 02/16/2023   HDL 46 02/16/2023   LDLCALC 23 02/16/2023   TRIG 106 02/16/2023   CHOLHDL 2.0 02/16/2023  09/24/2021: 142/100/59/65 He developed generalized aches and pains from pravastatin.  He restarted Zetia 12/2021 by cardiology.  He is also on Praluent.  - last eye exam was in 12/2022: Reported[facility] [place] ([facility]).   - no numbness and tingling in his feet. Last foot exam 09/10/2022.  Pt has  FH of DM in sister and brother.  He also has hypertension. He has a stroke 12/2020 - weak R side of body, but improving. He was in PT. he started to see neurology. He was admitted 02/01/2021 for chest pain. He had AKI. He has a history of lap band surgery 2010. Lost from 373 lbs to 215-220 lbs. He was admitted 03/2022 with ureteric stone and hydronephrosis along with AKI.  He subsequently had to have cystoscopy/ureteroscopy. 01/23/2021: B12 vitamin 733, TSH 1.83  ROS: C+ see HPI  I reviewed pt's medications, allergies, PMH, social hx, family hx, and changes were documented in the history of present illness. Otherwise, unchanged from my initial visit note.  Past Medical History:  Diagnosis Date   Allergic rhinitis     Anxiety    Atrial fibrillation (HCC)    Cancer (HCC)    hx of skin cancers    Chronic kidney disease    Coronary artery disease    a. s/p MI & CABG; b. s/p PCI Diag;  c. Cath 12/2011: LAD diff dzs/small, Diag 75% isr, 90 dist to stent (small), LCX & RCA occluded, VG->OM 40, VG->PDA patent - med rx.   COVID-19    x 3   Depression    PTSD   Diabetes mellitus    not on medications   Diastolic CHF, chronic (HCC)    a. EF 55-60% by echo 2007   GERD (gastroesophageal reflux disease)    "not anymore" " I had a lap band"   History of diverticulitis of colon    5 YRS AGO   History of kidney stones    History of pneumonia    2017   History of transient ischemic attack (TIA)    CAROTID DOPPLERS NOV 2011  0-39& BIL. STENOSIS   Hyperlipidemia    Hypertension    Kidney failure    Mitral regurgitation    Myocardial infarction [facility])    Neuromuscular disorder (HCC)    left arm numbness    OA (osteoarthritis)    RIGHT KNEE ARTHOFIBROSIS W/ PAIN  (S/P  REPLACEMENT 2004)   Pneumonia    PTSD (post-traumatic stress disorder)    from [place] since 1973   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/25/2016   Complex valvuloplasty including artificial Gore-tex neochord placement x4, plication of anterior commissure, and 26 mm Sorin Memo 3D ring annuloplasty via right mini thoracotomy approach   Shortness of breath dyspnea    with exertion   Skin cancer    Sleep apnea    cpap- see ov note in EPIC 05/12/11 for settings    Stroke [facility])    hx of   Stroke [facility])    Past Surgical History:  Procedure Laterality Date   AV FISTULA PLACEMENT Left 08/02/2018   Procedure: ARTERIOVENOUS (AV) FISTULA CREATION RADIOCEPHALIC;  Surgeon: Chuck Hint, MD;  Location: [facility] OR;  Service: Vascular;  Laterality: Left;   BLEPHAROPLASTY  1985   BILATERAL   CARDIAC CATHETERIZATION  2001, 2004, 2009, 2011   CARDIAC CATHETERIZATION N/A 01/23/2016   Procedure: Right/Left Heart Cath and Coronary Angiography;   Surgeon: Laurey Morale, MD;  Location: [facility] INVASIVE CV LAB;  Service: Cardiovascular;  Laterality: N/A;   CARDIOVERSION N/A 09/07/2017   Procedure: CARDIOVERSION;  Surgeon: Laurey Morale, MD;  Location: [facility] ENDOSCOPY;  Service: Cardiovascular;  Laterality: N/A;   CARDIOVERSION N/A 09/13/2019   Procedure: CARDIOVERSION;  Surgeon: Laurey Morale, MD;  Location: [facility] ENDOSCOPY;  Service: Cardiovascular;  Laterality:  N/A;   CARDIOVERSION N/A 10/19/2019   Procedure: CARDIOVERSION;  Surgeon: Laurey Morale, MD;  Location: [facility] ENDOSCOPY;  Service: Cardiovascular;  Laterality: N/A;   CARDIOVERSION N/A 04/14/2022   Procedure: CARDIOVERSION;  Surgeon: Laurey Morale, MD;  Location: [facility] ENDOSCOPY;  Service: Cardiovascular;  Laterality: N/A;   CARDIOVERSION N/A 02/22/2023   Procedure: CARDIOVERSION;  Surgeon: Laurey Morale, MD;  Location: [facility] INVASIVE CV LAB;  Service: Cardiovascular;  Laterality: N/A;   CARDIOVERSION N/A 03/12/2023   Procedure: CARDIOVERSION;  Surgeon: Laurey Morale, MD;  Location: [facility] INVASIVE CV LAB;  Service: Cardiovascular;  Laterality: N/A;   CATARACT EXTRACTION W/ INTRAOCULAR LENS IMPLANT Bilateral    CHONDROPLASTY  07/22/2011   Procedure: CHONDROPLASTY;  Surgeon: Loanne Drilling;  Location: [facility];  Service: Orthopedics;;   CIRCUMCISION  35 YRS  AGO   COLONOSCOPY     CORONARY ANGIOPLASTY  1996-- POST CABG   W/ STENT, last cath 01/07/2012    CORONARY ARTERY BYPASS GRAFT  1995   X3 VESSEL   CORONARY ARTERY BYPASS GRAFT  1995   CORONARY STENT PLACEMENT     CYSTOSCOPY W/ URETERAL STENT PLACEMENT Right 04/11/2022   Procedure: CYSTOSCOPY WITH RETROGRADE PYELOGRAM/URETERAL STENT PLACEMENT;  Surgeon: Malen Gauze, MD;  Location: [facility] ORS;  Service: Urology;  Laterality: Right;   CYSTOSCOPY/URETEROSCOPY/HOLMIUM LASER/STENT PLACEMENT Right 01/08/2021   Procedure: CYSTOSCOPY/RETROGRADE/URETEROSCOPY/HOLMIUM LASER/STENT PLACEMENT;  Surgeon: Marcine Matar, MD;   Location: [facility] ORS;  Service: Urology;  Laterality: Right;   CYSTOSCOPY/URETEROSCOPY/HOLMIUM LASER/STENT PLACEMENT Right 05/27/2022   Procedure: CYSTOSCOPY, URETEROSCOPY, HOLMIUM LASER, STENT EXCHANGE;  Surgeon: Rene Paci, MD;  Location: [facility]S;  Service: Urology;  Laterality: Right;   INCISION AND DRAINAGE PERIRECTAL ABSCESS N/A 09/01/2021   Procedure: IRRIGATION AND DEBRIDEMENT PERIRECTAL ABSCESS;  Surgeon: Sheliah Hatch De Blanch, MD;  Location: [facility] ORS;  Service: General;  Laterality: N/A;   IR PERC TUN PERIT CATH WO PORT S&I Judi Cong  03/06/2021   IR REMOVAL TUN CV CATH W/O FL  04/14/2021   KNEE ARTHROSCOPY  07/22/2011   Procedure: ARTHROSCOPY KNEE;  Surgeon: Loanne Drilling;  Location: [facility];  Service: Orthopedics;  Laterality: Right;  WITH DEBRIDEMENT    KNEE ARTHROSCOPY W/ MENISCECTOMY  X2 IN 2002-- RIGHT KNEE   LAPAROSCOPIC GASTRIC BANDING  01-15-09   LEFT HEART CATH AND CORONARY ANGIOGRAPHY N/A 04/29/2017   Procedure: LEFT HEART CATH AND CORONARY ANGIOGRAPHY;  Surgeon: Laurey Morale, MD;  Location: [facility] INVASIVE CV LAB;  Service: Cardiovascular;  Laterality: N/A;   LEFT HEART CATHETERIZATION WITH CORONARY ANGIOGRAM N/A 09/11/2013   Procedure: LEFT HEART CATHETERIZATION WITH CORONARY ANGIOGRAM;  Surgeon: Laurey Morale, MD;  Location: [facility] CATH LAB;  Service: Cardiovascular;  Laterality: N/A;   MITRAL VALVE REPAIR Right 03/25/2016   Procedure: MINIMALLY INVASIVE REOPERATION FOR MITRAL VALVE REPAIR  (MVR) with size 26 Sorin Memo 3D;  Surgeon: Purcell Nails, MD;  Location: [facility] OR;  Service: Open Heart Surgery;  Laterality: Right;   MITRAL VALVE REPAIR  03/2016   RIGHT KNEE ED COMPARTMENT REPLACEMENT  2004   SYNOVECTOMY  07/22/2011   Procedure: SYNOVECTOMY;  Surgeon: Gus Rankin Aluisio;  Location: [facility];  Service: Orthopedics;;   TEE WITHOUT CARDIOVERSION N/A 01/23/2016   Procedure: TRANSESOPHAGEAL ECHOCARDIOGRAM (TEE);  Surgeon: Laurey Morale, MD;   Location: [facility] ENDOSCOPY;  Service: Cardiovascular;  Laterality: N/A;   TEE WITHOUT CARDIOVERSION N/A 03/25/2016   Procedure: TRANSESOPHAGEAL ECHOCARDIOGRAM (TEE);  Surgeon: Purcell Nails,  MD;  Location: [facility] OR;  Service: Open Heart Surgery;  Laterality: N/A;   TEE WITHOUT CARDIOVERSION N/A 03/04/2021   Procedure: TRANSESOPHAGEAL ECHOCARDIOGRAM (TEE);  Surgeon: Lewayne Bunting, MD;  Location: [facility] ENDOSCOPY;  Service: Cardiovascular;  Laterality: N/A;   TEE WITHOUT CARDIOVERSION N/A 04/14/2022   Procedure: TRANSESOPHAGEAL ECHOCARDIOGRAM (TEE);  Surgeon: Laurey Morale, MD;  Location: [facility] ENDOSCOPY;  Service: Cardiovascular;  Laterality: N/A;   UMBILICAL HERNIA REPAIR  01-15-09   W/ GASTRIC BANDING PROCEDURE   Social History   Socioeconomic History   Marital status: Married    Spouse name: Not on file   Number of children: 1   Years of education: Not on file   Highest education level: Not on file  Occupational History   Contractor, semi-retired  Social Needs   Financial resource strain: Not on file   Food insecurity:    Worry: Not on file    Inability: Not on file   Transportation needs:    Medical: Not on file    Non-medical: Not on file  Tobacco Use   Smoking status: Never Smoker   Smokeless tobacco: Never Used  Substance and Sexual Activity   Alcohol use: Yes, liquor    Comment: OCCASIONAL   Drug use: No   Sexual activity: Not on file  Lifestyle   Physical activity:    Days per week: Not on file    Minutes per session: Not on file   Stress: Not on file  Relationships   Social connections:    Talks on phone: Not on file    Gets together: Not on file    Attends religious service: Not on file    Active member of club or organization: Not on file    Attends meetings of clubs or organizations: Not on file    Relationship status: Not on file   Intimate partner violence:    Fear of current or ex partner: Not on file    Emotionally abused: Not on file    Physically abused:  Not on file    Forced sexual activity: Not on file  Other Topics Concern   Not on file  Social History Narrative   Not on file   Current Outpatient Medications on File Prior to Visit  Medication Sig Dispense Refill   albuterol (VENTOLIN HFA) 108 (90 Base) MCG/ACT inhaler Inhale 1-2 puffs into the lungs every 6 (six) hours as needed for shortness of breath or wheezing.     Alirocumab (PRALUENT) 75 MG/ML SOAJ Inject 75 mg into the skin every 14 (fourteen) days.     allopurinol (ZYLOPRIM) 100 MG tablet Take 50 mg by mouth 2 (two) times a week.     apixaban (ELIQUIS) 5 MG TABS tablet Take 1 tablet (5 mg total) by mouth 2 (two) times daily. 180 tablet 3   busPIRone (BUSPAR) 10 MG tablet Take 5 mg by mouth 2 (two) times daily.     cetirizine (ZYRTEC) 10 MG tablet Take 10 mg by mouth in the morning.     colchicine 0.6 MG tablet Take 0.6-1.2 mg by mouth 2 (two) times daily as needed (gout).     Cyanocobalamin (VITAMIN B-12) 5000 MCG TBDP Take 5,000 mcg by mouth 2 (two) times a week.     cycloSPORINE (RESTASIS) 0.05 % ophthalmic emulsion Place 1 drop into both eyes 2 (two) times daily as needed (eye irritation.).     diclofenac Sodium (VOLTAREN) 1 % GEL Apply 2 g topically 3 (three) times daily  as needed for pain.     docusate sodium (COLACE) 100 MG capsule Take 200 mg by mouth at bedtime.     ezetimibe (ZETIA) 10 MG tablet Take 1 tablet (10 mg total) by mouth daily. 90 tablet 3   ferrous sulfate 325 (65 FE) MG tablet Take 325 mg by mouth every other day.     fluticasone (FLONASE) 50 MCG/ACT nasal spray Place 1 spray into both nostrils daily as needed for allergies or rhinitis.     furosemide (LASIX) 40 MG tablet Take 3 tablets (120 mg total) by mouth 2 (two) times daily. Please cancel all previous orders for current medication. Change in dosage or pill size. 180 tablet 3   HYDROcodone-acetaminophen (NORCO) 10-325 MG tablet Take 1 tablet by mouth 3 (three) times daily as needed for moderate pain.      Meclizine HCl 25 MG CHEW Chew 25 mg by mouth 3 (three) times daily as needed (dizziness).     methocarbamol (ROBAXIN) 500 MG tablet Take 500 mg by mouth 3 (three) times daily as needed for muscle spasms.     metoprolol succinate (TOPROL-XL) 50 MG 24 hr tablet Take 0.5 tablets (25 mg total) by mouth daily. Take with or immediately following a meal.     Multiple Vitamin (MULTIVITAMIN) capsule Take 1 capsule by mouth in the morning. (Patient not taking: Reported on 06/09/2023)     nitroGLYCERIN (NITROSTAT) 0.4 MG SL tablet 1 TAB UNDER THE TONGUE EVERY 5 MINUTES AS NEEDED FOR CHEST PAIN UP TO 3 DOSES, IF PERSIST CALL 911 25 tablet 3   ONETOUCH VERIO test strip 1 each by Other route in the morning and at bedtime. 200 each 5   pantoprazole (PROTONIX) 40 MG tablet Take 40 mg by mouth daily as needed (heartburn).     potassium chloride SA (KLOR-CON M) 20 MEQ tablet Take 20 mEq by mouth daily.     Semaglutide,0.25 or 0.5MG /DOS, 2 MG/3ML SOPN Inject 0.5 mg into the skin once a week. 9 mL 3   tadalafil (CIALIS) 20 MG tablet Take 20 mg by mouth daily as needed for erectile dysfunction.     testosterone cypionate (DEPOTESTOSTERONE CYPIONATE) 200 MG/ML injection Inject 200 mg into the muscle once a week.     traZODone (DESYREL) 100 MG tablet Take 100 mg by mouth at bedtime.     No current facility-administered medications on file prior to visit.   Allergies  Allergen Reactions   Crestor [Rosuvastatin] Other (See Comments)    Muscle aches    Lipitor [Atorvastatin] Other (See Comments)    Muscle aches   Pravastatin Other (See Comments)    Muscle aches   Carvedilol Other (See Comments)    loss of appetite   Metformin Diarrhea   Serotonin Reuptake Inhibitors (Ssris) Other (See Comments)    Muscle aches   Zocor [Simvastatin] Other (See Comments)     Muscle pain   Codeine Itching and Other (See Comments)    Extremity tingling-- can take synthetic   Family History  Problem Relation Age of Onset    Other Mother        joint problems   Alzheimer's disease Mother    Hypertension Father    Heart disease Father    CVA Father        age 61   Depression Father    Heart disease Sister        CABG   Stroke Sister    Heart failure Sister  congestive   Diabetes Sister    Heart disease Brother    Hypertension Brother    Congestive Heart Failure Brother    PE: BP 124/80   Pulse (!) 55   Ht 5\' 10"  (1.778 m)   Wt 192 lb 3.2 oz (87.2 kg)   SpO2 96%   BMI 27.58 kg/m  Wt Readings from Last 15 Encounters:  07/15/23 192 lb 3.2 oz (87.2 kg)  06/09/23 196 lb 6.4 oz (89.1 kg)  05/21/23 199 lb 3.2 oz (90.4 kg)  04/15/23 202 lb 3.2 oz (91.7 kg)  04/08/23 203 lb 9.6 oz (92.4 kg)  03/30/23 192 lb 3.2 oz (87.2 kg)  03/12/23 192 lb (87.1 kg)  03/09/23 194 lb 9.6 oz (88.3 kg)  03/02/23 195 lb 9.6 oz (88.7 kg)  02/16/23 201 lb (91.2 kg)  01/14/23 198 lb 12.8 oz (90.2 kg)  01/11/23 200 lb (90.7 kg)  12/22/22 200 lb (90.7 kg)  09/10/22 198 lb 9.6 oz (90.1 kg)  09/08/22 186 lb (84.4 kg)   Constitutional: overweight, in NAD Eyes: EOMI, no exophthalmos ENT: no thyromegaly, no cervical lymphadenopathy Cardiovascular: RRR, No MRG Respiratory: CTA B  Musculoskeletal: no deformities Skin: ecchymoses on bilateral dorsum of hands Neurological: + tremor with outstretched R hand Diabetic Foot Exam - Simple   Simple Foot Form Diabetic Foot exam was performed with the following findings: Yes 07/15/2023 11:41 AM  Visual Inspection No deformities, no ulcerations, no other skin breakdown bilaterally: Yes Sensation Testing Intact to touch and monofilament testing bilaterally: Yes Pulse Check Posterior Tibialis and Dorsalis pulse intact bilaterally: Yes Comments    ASSESSMENT: 1. DM2, non-insulin-dependent, uncontrolled, with long-term complications - CAD, s/p coronaroplasty, then CABG 1995 - cardiologist - Dr. Shirlee Latch - CHF, EF 55 to 65% - Atrial fibrillation, cardioversion in 2019 -  Carotid artery disease - Cerebrovascular disease, s/p TIA, s/p stroke 12/2020 - Ao ATS per abdominal CT (08/31/2021) - CKD stage V, pending dialysis -Dr. Darrick Penna - ED  2. HL  3.  Obesity class I  PLAN:  1. Patient with longstanding, previously uncontrolled type II with improved control in the last 3.5 years.  He had to come off warming due to the declining kidney function.  He is currently on weekly GLP-1 receptor agonist low-dose with prn sulfonylurea before larger dinners.  HbA1c at last visit was stable, at goal, it 6.8%.  We did not change his regimen at that time.  I did recommend the CGM -sent prescription to the [facility] pharmacy.  We tried the G7 Dexcom CGM in the past but at last visit I sent a prescription for the freestyle libre 3 CGM. -At today's visit sugars appear to be mostly higher than goal, possibly related to eating sweets throughout the day.  We discussed about the importance of reducing these.  In the meantime, I recommended to try to increase the Ozempic dose.  This may help with the weight gain, but also with his sweet cravings along with improving diabetes control.  I again advised him to take a pill diet before a meal that contains carbs, like pasta or rice. - I suggested to:  Patient Instructions  Please increase: - Ozempic 1 mg weekly  You can take Glipizide 2.5-5 mg before a larger dinner.  Please return in 4 months.  - we checked his HbA1c: 7.4% (higher) - advised to check sugars at different times of the day - 4x a day, rotating check times - advised for yearly eye exams >> he is  UTD - return to clinic in 4 months  2. HL -Reviewed latest lipid panel from 01/2023: Fractions at goal: Lab Results  Component Value Date   CHOL 90 02/16/2023   HDL 46 02/16/2023   LDLCALC 23 02/16/2023   TRIG 106 02/16/2023   CHOLHDL 2.0 02/16/2023  -He is on Zetia 10 mg daily and Praluent.  He had side effects from statins in the past - mm aches.  3.  Obesity class I -Will  continue Ozempic which should also help with weight loss -will increase the dose at today's visit -Weight was approximately stable at last visit -He lost approximately 8 pounds since last visit  Carlus Pavlov, MD PhD [facility] Endocrinology

## 2023-07-21 ENCOUNTER — Encounter (HOSPITAL_BASED_OUTPATIENT_CLINIC_OR_DEPARTMENT_OTHER): Payer: Self-pay

## 2023-07-21 ENCOUNTER — Emergency Department (HOSPITAL_BASED_OUTPATIENT_CLINIC_OR_DEPARTMENT_OTHER): Payer: No Typology Code available for payment source

## 2023-07-21 ENCOUNTER — Other Ambulatory Visit: Payer: Self-pay

## 2023-07-21 ENCOUNTER — Inpatient Hospital Stay (HOSPITAL_BASED_OUTPATIENT_CLINIC_OR_DEPARTMENT_OTHER)
Admission: EM | Admit: 2023-07-21 | Discharge: 2023-07-24 | DRG: 389 | Disposition: A | Discharge status: Home / Self Care | Payer: No Typology Code available for payment source | Attending: Internal Medicine | Admitting: Internal Medicine

## 2023-07-21 DIAGNOSIS — Z9884 Bariatric surgery status: Secondary | ICD-10-CM

## 2023-07-21 DIAGNOSIS — Z87442 Personal history of urinary calculi: Secondary | ICD-10-CM

## 2023-07-21 DIAGNOSIS — I5032 Chronic diastolic (congestive) heart failure: Secondary | ICD-10-CM | POA: Diagnosis present

## 2023-07-21 DIAGNOSIS — E119 Type 2 diabetes mellitus without complications: Secondary | ICD-10-CM

## 2023-07-21 DIAGNOSIS — Z7985 Long-term (current) use of injectable non-insulin antidiabetic drugs: Secondary | ICD-10-CM

## 2023-07-21 DIAGNOSIS — Z820 Family history of epilepsy and other diseases of the nervous system: Secondary | ICD-10-CM | POA: Diagnosis not present

## 2023-07-21 DIAGNOSIS — Z885 Allergy status to narcotic agent status: Secondary | ICD-10-CM

## 2023-07-21 DIAGNOSIS — Z961 Presence of intraocular lens: Secondary | ICD-10-CM | POA: Diagnosis present

## 2023-07-21 DIAGNOSIS — Z8616 Personal history of COVID-19: Secondary | ICD-10-CM | POA: Diagnosis not present

## 2023-07-21 DIAGNOSIS — I2510 Atherosclerotic heart disease of native coronary artery without angina pectoris: Secondary | ICD-10-CM | POA: Diagnosis present

## 2023-07-21 DIAGNOSIS — I25810 Atherosclerosis of coronary artery bypass graft(s) without angina pectoris: Secondary | ICD-10-CM | POA: Diagnosis present

## 2023-07-21 DIAGNOSIS — Z823 Family history of stroke: Secondary | ICD-10-CM

## 2023-07-21 DIAGNOSIS — I482 Chronic atrial fibrillation, unspecified: Secondary | ICD-10-CM | POA: Diagnosis present

## 2023-07-21 DIAGNOSIS — Z8249 Family history of ischemic heart disease and other diseases of the circulatory system: Secondary | ICD-10-CM | POA: Diagnosis not present

## 2023-07-21 DIAGNOSIS — Z7901 Long term (current) use of anticoagulants: Secondary | ICD-10-CM | POA: Diagnosis not present

## 2023-07-21 DIAGNOSIS — Z931 Gastrostomy status: Secondary | ICD-10-CM

## 2023-07-21 DIAGNOSIS — Z8701 Personal history of pneumonia (recurrent): Secondary | ICD-10-CM

## 2023-07-21 DIAGNOSIS — I69351 Hemiplegia and hemiparesis following cerebral infarction affecting right dominant side: Secondary | ICD-10-CM

## 2023-07-21 DIAGNOSIS — Z888 Allergy status to other drugs, medicaments and biological substances status: Secondary | ICD-10-CM

## 2023-07-21 DIAGNOSIS — Z85828 Personal history of other malignant neoplasm of skin: Secondary | ICD-10-CM

## 2023-07-21 DIAGNOSIS — N185 Chronic kidney disease, stage 5: Secondary | ICD-10-CM | POA: Diagnosis present

## 2023-07-21 DIAGNOSIS — E782 Mixed hyperlipidemia: Secondary | ICD-10-CM | POA: Diagnosis present

## 2023-07-21 DIAGNOSIS — Z87891 Personal history of nicotine dependence: Secondary | ICD-10-CM

## 2023-07-21 DIAGNOSIS — E1122 Type 2 diabetes mellitus with diabetic chronic kidney disease: Secondary | ICD-10-CM | POA: Diagnosis present

## 2023-07-21 DIAGNOSIS — Z951 Presence of aortocoronary bypass graft: Secondary | ICD-10-CM

## 2023-07-21 DIAGNOSIS — Z955 Presence of coronary angioplasty implant and graft: Secondary | ICD-10-CM

## 2023-07-21 DIAGNOSIS — Z79899 Other long term (current) drug therapy: Secondary | ICD-10-CM

## 2023-07-21 DIAGNOSIS — Z9841 Cataract extraction status, right eye: Secondary | ICD-10-CM

## 2023-07-21 DIAGNOSIS — Z7984 Long term (current) use of oral hypoglycemic drugs: Secondary | ICD-10-CM

## 2023-07-21 DIAGNOSIS — I480 Paroxysmal atrial fibrillation: Secondary | ICD-10-CM | POA: Diagnosis present

## 2023-07-21 DIAGNOSIS — K56609 Unspecified intestinal obstruction, unspecified as to partial versus complete obstruction: Secondary | ICD-10-CM | POA: Diagnosis present

## 2023-07-21 DIAGNOSIS — Z833 Family history of diabetes mellitus: Secondary | ICD-10-CM | POA: Diagnosis not present

## 2023-07-21 DIAGNOSIS — E876 Hypokalemia: Secondary | ICD-10-CM | POA: Diagnosis present

## 2023-07-21 DIAGNOSIS — G4733 Obstructive sleep apnea (adult) (pediatric): Secondary | ICD-10-CM | POA: Diagnosis present

## 2023-07-21 DIAGNOSIS — Z9842 Cataract extraction status, left eye: Secondary | ICD-10-CM

## 2023-07-21 DIAGNOSIS — I10 Essential (primary) hypertension: Secondary | ICD-10-CM | POA: Diagnosis present

## 2023-07-21 DIAGNOSIS — I132 Hypertensive heart and chronic kidney disease with heart failure and with stage 5 chronic kidney disease, or end stage renal disease: Secondary | ICD-10-CM | POA: Diagnosis present

## 2023-07-21 DIAGNOSIS — K565 Intestinal adhesions [bands], unspecified as to partial versus complete obstruction: Principal | ICD-10-CM | POA: Diagnosis present

## 2023-07-21 DIAGNOSIS — I252 Old myocardial infarction: Secondary | ICD-10-CM

## 2023-07-21 DIAGNOSIS — N184 Chronic kidney disease, stage 4 (severe): Secondary | ICD-10-CM | POA: Diagnosis present

## 2023-07-21 LAB — HEPATIC FUNCTION PANEL
ALT: 14 U/L (ref 0–44)
AST: 20 U/L (ref 15–41)
Albumin: 4.5 g/dL (ref 3.5–5.0)
Alkaline Phosphatase: 102 U/L (ref 38–126)
Bilirubin, Direct: 0.2 mg/dL (ref 0.0–0.2)
Indirect Bilirubin: 1 mg/dL — ABNORMAL HIGH (ref 0.3–0.9)
Total Bilirubin: 1.2 mg/dL — ABNORMAL HIGH (ref <1.2)
Total Protein: 7.7 g/dL (ref 6.5–8.1)

## 2023-07-21 LAB — LIPASE, BLOOD: Lipase: 61 U/L — ABNORMAL HIGH (ref 11–51)

## 2023-07-21 LAB — BRAIN NATRIURETIC PEPTIDE: B Natriuretic Peptide: 295.1 pg/mL — ABNORMAL HIGH (ref 0.0–100.0)

## 2023-07-21 LAB — URINALYSIS, ROUTINE W REFLEX MICROSCOPIC
Bilirubin Urine: NEGATIVE
Glucose, UA: 250 mg/dL — AB
Ketones, ur: NEGATIVE mg/dL
Leukocytes,Ua: NEGATIVE
Nitrite: NEGATIVE
Protein, ur: 100 mg/dL — AB
Specific Gravity, Urine: 1.015 (ref 1.005–1.030)
pH: 5.5 (ref 5.0–8.0)

## 2023-07-21 LAB — TROPONIN I (HIGH SENSITIVITY)
Troponin I (High Sensitivity): 15 ng/L (ref <18)
Troponin I (High Sensitivity): 15 ng/L (ref <18)

## 2023-07-21 LAB — I-STAT CHEM 8, ED
BUN: 56 mg/dL — ABNORMAL HIGH (ref 8–23)
Calcium, Ion: 1.23 mmol/L (ref 1.15–1.40)
Chloride: 99 mmol/L (ref 98–111)
Creatinine, Ser: 5 mg/dL — ABNORMAL HIGH (ref 0.61–1.24)
Glucose, Bld: 219 mg/dL — ABNORMAL HIGH (ref 70–99)
HCT: 42 % (ref 39.0–52.0)
Hemoglobin: 14.3 g/dL (ref 13.0–17.0)
Potassium: 3.4 mmol/L — ABNORMAL LOW (ref 3.5–5.1)
Sodium: 136 mmol/L (ref 135–145)
TCO2: 24 mmol/L (ref 22–32)

## 2023-07-21 LAB — CBC
HCT: 43.3 % (ref 39.0–52.0)
Hemoglobin: 14.6 g/dL (ref 13.0–17.0)
MCH: 31.7 pg (ref 26.0–34.0)
MCHC: 33.7 g/dL (ref 30.0–36.0)
MCV: 94.1 fL (ref 80.0–100.0)
Platelets: 182 10*3/uL (ref 150–400)
RBC: 4.6 MIL/uL (ref 4.22–5.81)
RDW: 12.4 % (ref 11.5–15.5)
WBC: 10.6 10*3/uL — ABNORMAL HIGH (ref 4.0–10.5)
nRBC: 0 % (ref 0.0–0.2)

## 2023-07-21 LAB — URINALYSIS, MICROSCOPIC (REFLEX)

## 2023-07-21 MED ORDER — MORPHINE SULFATE (PF) 4 MG/ML IV SOLN
4.0000 mg | Freq: Once | INTRAVENOUS | Status: AC
  Administered 2023-07-21: 4 mg via INTRAVENOUS
  Filled 2023-07-21: qty 1

## 2023-07-21 MED ORDER — LORAZEPAM 2 MG/ML IJ SOLN
0.5000 mg | Freq: Once | INTRAMUSCULAR | Status: AC
  Administered 2023-07-21: 0.5 mg via INTRAVENOUS
  Filled 2023-07-21: qty 1

## 2023-07-21 MED ORDER — HYDROMORPHONE HCL 1 MG/ML IJ SOLN
0.5000 mg | Freq: Once | INTRAMUSCULAR | Status: AC
  Administered 2023-07-21: 0.5 mg via INTRAVENOUS
  Filled 2023-07-21: qty 1

## 2023-07-21 NOTE — ED Notes (Signed)
[facility] called for transport , No Current ETA.. ED Nurse will call floor for report Called @ 22:36

## 2023-07-21 NOTE — Progress Notes (Signed)
Hospitalist Transfer Note:    Nursing staff, Please call [facility] Admits & Consults System-Wide number on Amion [phone]) as soon as patient's arrival, so appropriate admitting provider can evaluate the pt.   Transferring facility: [facility] Requesting provider: Elpidio Anis (EDP at [facility]) Reason for transfer: admission for further evaluation and management of small bowel obstruction.   77 year old male with history of gastric banding, chronic diastolic heart failure, paroxysmal atrial fibrillation chronically anticoagulated on Eliquis, who presented to [facility] ED complaining of 5 days of progressive abdominal discomfort associate with nausea in the absence of any vomiting.  Ensuing CT abdomen/pelvis showed evidence of small bowel obstruction.   EDP discussed patient's case with on-call general surgery, Dr. Hillery Hunter, who conveyed that [facility] surgery will consult.  Orders for NGT were placed at [facility].    Subsequently, I accepted this patient for transfer for inpatient admission to a med-tele bed at [facility] for further work-up and management of the above.      Newton Pigg, DO Hospitalist

## 2023-07-21 NOTE — ED Provider Notes (Signed)
[place] [facility] Provider Note   CSN: [id number] Arrival date & time: 07/21/23  1749     History  Chief Complaint  Patient presents with   Abdominal Pain    [name] is a 77 y.o. male.  Patient with history of chronic AF on Eliquis, dCHF, HTN, CAD, HLD, remote h/o diverticulitis, CKD, DM, CVA presents with symptoms that started about 3 weeks ago as sinus congestion with some generalized abdominal discomfort that went to the back and shoulders bilaterally. He was treated with antibiotics through the [place]. Symptoms improved then worsened again. No vomiting. He has been moving his bowels and reports hard stools without blood or melena. Urinating per his usual.   The history is provided by the patient and the spouse. No language interpreter was used.  Abdominal Pain      Home Medications Prior to Admission medications   Medication Sig Start Date End Date Taking? Authorizing Provider  albuterol (VENTOLIN HFA) 108 (90 Base) MCG/ACT inhaler Inhale 1-2 puffs into the lungs every 6 (six) hours as needed for shortness of breath or wheezing.    [provider]  Alirocumab (PRALUENT) 75 MG/ML SOAJ Inject 75 mg into the skin every 14 (fourteen) days.    [provider]  allopurinol (ZYLOPRIM) 100 MG tablet Take 50 mg by mouth 2 (two) times a week.    [provider]  apixaban (ELIQUIS) 5 MG TABS tablet Take 1 tablet (5 mg total) by mouth 2 (two) times daily. 09/05/21   Rolly Salter, MD  busPIRone (BUSPAR) 10 MG tablet Take 5 mg by mouth 2 (two) times daily.    [provider]  cetirizine (ZYRTEC) 10 MG tablet Take 10 mg by mouth in the morning.    [provider]  colchicine 0.6 MG tablet Take 0.6-1.2 mg by mouth 2 (two) times daily as needed (gout). 04/14/19   [provider]  Cyanocobalamin (VITAMIN B-12) 5000 MCG TBDP Take 5,000 mcg by mouth 2 (two) times a week.    [provider]   cycloSPORINE (RESTASIS) 0.05 % ophthalmic emulsion Place 1 drop into both eyes 2 (two) times daily as needed (eye irritation.).    [provider]  diclofenac Sodium (VOLTAREN) 1 % GEL Apply 2 g topically 3 (three) times daily as needed for pain. 07/05/21   [provider]  docusate sodium (COLACE) 100 MG capsule Take 200 mg by mouth at bedtime.    [provider]  ezetimibe (ZETIA) 10 MG tablet Take 1 tablet (10 mg total) by mouth daily. 12/23/22   Laurey Morale, MD  ferrous sulfate 325 (65 FE) MG tablet Take 325 mg by mouth every other day.    [provider]  fluticasone (FLONASE) 50 MCG/ACT nasal spray Place 1 spray into both nostrils daily as needed for allergies or rhinitis.    [provider]  furosemide (LASIX) 40 MG tablet Take 3 tablets (120 mg total) by mouth 2 (two) times daily. Please cancel all previous orders for current medication. Change in dosage or pill size. 06/09/23   Laurey Morale, MD  glipiZIDE (GLUCOTROL) 5 MG tablet Take 0.5-1 tablets (2.5-5 mg total) by mouth daily before supper. 07/15/23   Carlus Pavlov, MD  HYDROcodone-acetaminophen (NORCO) 10-325 MG tablet Take 1 tablet by mouth 3 (three) times daily as needed for moderate pain. 12/02/22   [provider]  Meclizine HCl 25 MG CHEW Chew 25 mg by mouth 3 (  three) times daily as needed (dizziness). 12/07/22   [provider]  methocarbamol (ROBAXIN) 500 MG tablet Take 500 mg by mouth 3 (three) times daily as needed for muscle spasms. 08/20/21   [provider]  metoprolol succinate (TOPROL-XL) 50 MG 24 hr tablet Take 0.5 tablets (25 mg total) by mouth daily. Take with or immediately following a meal. 06/09/23   Laurey Morale, MD  Multiple Vitamin (MULTIVITAMIN) capsule Take 1 capsule by mouth in the morning.    [provider]  nitroGLYCERIN (NITROSTAT) 0.4 MG SL tablet 1 TAB UNDER THE TONGUE EVERY 5 MINUTES AS NEEDED FOR CHEST PAIN UP TO  3 DOSES, IF PERSIST CALL 911 04/08/23   Robbie Lis M, PA-C  ONETOUCH VERIO test strip 1 each by Other route in the morning and at bedtime. 05/05/23   Carlus Pavlov, MD  pantoprazole (PROTONIX) 40 MG tablet Take 40 mg by mouth daily as needed (heartburn).    [provider]  potassium chloride SA (KLOR-CON M) 20 MEQ tablet Take 20 mEq by mouth daily.    [provider]  Semaglutide, 1 MG/DOSE, (OZEMPIC, 1 MG/DOSE,) 4 MG/3ML SOPN Inject 1 mg into the skin once a week. 07/15/23   Carlus Pavlov, MD  tadalafil (CIALIS) 20 MG tablet Take 20 mg by mouth daily as needed for erectile dysfunction.    [provider]  testosterone cypionate (DEPOTESTOSTERONE CYPIONATE) 200 MG/ML injection Inject 200 mg into the muscle once a week. 02/26/23   [provider]  traZODone (DESYREL) 100 MG tablet Take 100 mg by mouth at bedtime.    [provider]      Allergies    Crestor [rosuvastatin], Lipitor [atorvastatin], Pravastatin, Carvedilol, Metformin, Serotonin reuptake inhibitors (ssris), Zocor [simvastatin], and Codeine    Review of Systems   Review of Systems  Gastrointestinal:  Positive for abdominal pain.    Physical Exam Updated Vital Signs BP 127/85   Pulse 74   Temp 97.7 F (36.5 C)   Resp 16   Ht 5\' 10"  (1.778 m)   Wt 85.3 kg   SpO2 95%   BMI 26.98 kg/m  Physical Exam Vitals and nursing note reviewed.  Constitutional:      Appearance: He is well-developed.  HENT:     Head: Normocephalic.     Mouth/Throat:     Comments: Dry oral mucosa Cardiovascular:     Rate and Rhythm: Normal rate and regular rhythm.     Heart sounds: No murmur heard. Pulmonary:     Effort: Pulmonary effort is normal.     Breath sounds: No wheezing, rhonchi or rales.  Abdominal:     General: Abdomen is protuberant. There is distension.     Palpations: Abdomen is soft.     Tenderness: There is abdominal tenderness in the left lower quadrant. There is left  CVA tenderness.     Comments: Subcutaneous device in RUQ abdomen c/w previous gastric banding surgery. Nontender.   Skin:    General: Skin is warm and dry.  Neurological:     Mental Status: He is alert and oriented to person, place, and time.     ED Results / Procedures / Treatments   Labs (all labs ordered are listed, but only abnormal results are displayed) Labs Reviewed  LIPASE, BLOOD - Abnormal; Notable for the following components:      Result Value   Lipase 61 (*)    All other components within normal limits  CBC - Abnormal; Notable for  the following components:   WBC 10.6 (*)    All other components within normal limits  URINALYSIS, ROUTINE W REFLEX MICROSCOPIC - Abnormal; Notable for the following components:   Glucose, UA 250 (*)    Hgb urine dipstick TRACE (*)    Protein, ur 100 (*)    All other components within normal limits  HEPATIC FUNCTION PANEL - Abnormal; Notable for the following components:   Total Bilirubin 1.2 (*)    Indirect Bilirubin 1.0 (*)    All other components within normal limits  BRAIN NATRIURETIC PEPTIDE - Abnormal; Notable for the following components:   B Natriuretic Peptide 295.1 (*)    All other components within normal limits  URINALYSIS, MICROSCOPIC (REFLEX) - Abnormal; Notable for the following components:   Bacteria, UA RARE (*)    All other components within normal limits  I-STAT CHEM 8, ED - Abnormal; Notable for the following components:   Potassium 3.4 (*)    BUN 56 (*)    Creatinine, Ser 5.00 (*)    Glucose, Bld 219 (*)    All other components within normal limits  TROPONIN I (HIGH SENSITIVITY)  TROPONIN I (HIGH SENSITIVITY)   Results for orders placed or performed during the hospital encounter of 07/21/23  Lipase, blood  Result Value Ref Range   Lipase 61 (H) 11 - 51 U/L  CBC  Result Value Ref Range   WBC 10.6 (H) 4.0 - 10.5 K/uL   RBC 4.60 4.22 - 5.81 MIL/uL   Hemoglobin 14.6 13.0 - 17.0 g/dL   HCT 60.4 54.0 - 98.1 %    MCV 94.1 80.0 - 100.0 fL   MCH 31.7 26.0 - 34.0 pg   MCHC 33.7 30.0 - 36.0 g/dL   RDW 19.1 47.8 - 29.5 %   Platelets 182 150 - 400 K/uL   nRBC 0.0 0.0 - 0.2 %  Urinalysis, Routine w reflex microscopic -Urine, Clean Catch  Result Value Ref Range   Color, Urine YELLOW YELLOW   APPearance CLEAR CLEAR   Specific Gravity, Urine 1.015 1.005 - 1.030   pH 5.5 5.0 - 8.0   Glucose, UA 250 (A) NEGATIVE mg/dL   Hgb urine dipstick TRACE (A) NEGATIVE   Bilirubin Urine NEGATIVE NEGATIVE   Ketones, ur NEGATIVE NEGATIVE mg/dL   Protein, ur 621 (A) NEGATIVE mg/dL   Nitrite NEGATIVE NEGATIVE   Leukocytes,Ua NEGATIVE NEGATIVE  Hepatic function panel  Result Value Ref Range   Total Protein 7.7 6.5 - 8.1 g/dL   Albumin 4.5 3.5 - 5.0 g/dL   AST 20 15 - 41 U/L   ALT 14 0 - 44 U/L   Alkaline Phosphatase 102 38 - 126 U/L   Total Bilirubin 1.2 (H) <1.2 mg/dL   Bilirubin, Direct 0.2 0.0 - 0.2 mg/dL   Indirect Bilirubin 1.0 (H) 0.3 - 0.9 mg/dL  Brain natriuretic peptide  Result Value Ref Range   B Natriuretic Peptide 295.1 (H) 0.0 - 100.0 pg/mL  Urinalysis, Microscopic (reflex)  Result Value Ref Range   RBC / HPF 0-5 0 - 5 RBC/hpf   WBC, UA 0-5 0 - 5 WBC/hpf   Bacteria, UA RARE (A) NONE SEEN   Squamous Epithelial / HPF 0-5 0 - 5 /HPF  I-stat chem 8, ED (not at [facility], [facility] or [facility])  Result Value Ref Range   Sodium 136 135 - 145 mmol/L   Potassium 3.4 (L) 3.5 - 5.1 mmol/L   Chloride 99 98 - 111 mmol/L   BUN 56 (  H) 8 - 23 mg/dL   Creatinine, Ser 1.61 (H) 0.61 - 1.24 mg/dL   Glucose, Bld 096 (H) 70 - 99 mg/dL   Calcium, Ion 0.45 4.09 - 1.40 mmol/L   TCO2 24 22 - 32 mmol/L   Hemoglobin 14.3 13.0 - 17.0 g/dL   HCT 81.1 91.4 - 78.2 %  Troponin I (High Sensitivity)  Result Value Ref Range   Troponin I (High Sensitivity) 15 <18 ng/L  Troponin I (High Sensitivity)  Result Value Ref Range   Troponin I (High Sensitivity) 15 <18 ng/L    EKG None  Radiology CT ABDOMEN PELVIS WO CONTRAST  Result  Date: 07/21/2023 CLINICAL DATA:  Left lower quadrant pain and bloating, with chest pressure, ongoing for 3 weeks but worsening today. EXAM: CT ABDOMEN AND PELVIS WITHOUT CONTRAST TECHNIQUE: Multidetector CT imaging of the abdomen and pelvis was performed following the standard protocol without IV contrast. RADIATION DOSE REDUCTION: This exam was performed according to the departmental dose-optimization program which includes automated exposure control, adjustment of the mA and/or kV according to patient size and/or use of iterative reconstruction technique. COMPARISON:  CTs without contrast 04/10/2022 and 01/07/2021. FINDINGS: Lower chest: There are posterior atelectatic changes in the lower lobes without focal infiltrates in the lung bases. There is a small calcified pleural plaque along the dome of the right hemidiaphragm. The heart is moderately enlarged. There are sternotomy sutures are again noted and a prosthetic mitral valve. The coronary arteries are heavily calcified. No pericardial effusion. Hepatobiliary: The liver is unremarkable without contrast. The liver is 18 cm in length. There are multiple tiny stones in the posterior gallbladder, mild dilatation of the gallbladder, but no wall thickening or biliary dilatation. Pancreas: Moderately atrophic. No masses seen without contrast. There are occasional punctate parenchymal calcifications consistent with chronic calcific pancreatitis. There is no evidence of acute pancreatitis. Spleen: Slightly enlarged, measures 13.1 cm length. No focal abnormality without contrast. There is patchy calcification splenic artery. Adrenals/Urinary Tract: There are no adrenal mass. No contour deforming abnormality in the unenhanced kidneys is seen. Bilateral perinephric stranding appears unchanged. There is no nephrolithiasis. There is mild bilateral cortical thinning. Previously there was an 8 mm mid right ureteral stone which has cleared in the interval. No ureteral stones  or hydronephrosis are seen. There is no bladder thickening. Stomach/Bowel: The stomach moderately dilated with air and fluid Gastric lap band positioning is physiologic with upper limit of normal phi angle of 54 degrees. No acute complication is seen related to the injection tubing and right mid abdominal subcutaneous port. The small bowel is dilated up to 3.6 cm beginning just distal to the ligament of Treitz and continuing to a sharply angulated central abdominal transitional small bowel segment right of midline on 301: 43-46 and coronal reconstruction images 108-110. Etiology is most likely adhesive disease. Elsewhere, in the right lower abdomen there is a clockwise swirling of the mesenteric vessels, specifically on images 301: 48-57. This is consistent with an internal hernia but no bowel is seen entrapped within the swirling. No bowel pneumatosis or portal venous gas is evident. The appendix is normal. There is diffuse colonic diverticulosis heaviest in the descending and sigmoid segments, without findings of acute colitis or diverticulitis. Vascular/Lymphatic: Aortic atherosclerosis. No enlarged abdominal or pelvic lymph nodes. Reproductive: There is mild prostatomegaly. Other: There are mesenteric congestive changes in the right lower quadrant in the area of vascular swirling, but this is not seen elsewhere. There are bilateral small inguinal fat hernias. No incarcerated  abdominal wall hernia. There is no free fluid, free hemorrhage or free air. Musculoskeletal: There is extensive bridging enthesopathy of the visualized thoracic spine. Osteopenia and advanced degenerative change lumbar spine. Mild lumbar dextroscoliosis. No acute or other significant osseous findings IMPRESSION: 1. Intermediate-grade small-bowel obstruction with transition point in the distal ileum in the central abdomen right of midline, most likely due to adhesive disease. 2. There is also a clockwise swirling of the mesenteric vessels in  the right lower abdomen separate from this consistent with an internal hernia, but no bowel is seen entrapped within the swirling. There are mesenteric congestive changes in the area of vascular swirling, not seen elsewhere. No bowel pneumatosis or portal venous gas at this time. Surgical consult recommended. 3. Cholelithiasis with mild dilatation of the gallbladder but no wall thickening or biliary dilatation. 4. Gastric lap band positioning is physiologic with upper limit of normal phi angle of 54 degrees. 5. Cardiomegaly, aortic and coronary artery atherosclerosis. 6. Diverticulosis without evidence of diverticulitis. 7. Mild prostatomegaly. 8. Osteopenia and degenerative change. 9. Small calcified pleural plaque along the dome of the right hemidiaphragm. Aortic Atherosclerosis (ICD10-I70.0). Electronically Signed   By: Almira Bar M.D.   On: 07/21/2023 20:34   DG Chest Port 1 View  Result Date: 07/21/2023 CLINICAL DATA:  Upper chest pain and back pain. EXAM: PORTABLE CHEST 1 VIEW COMPARISON:  February 26, 2021 FINDINGS: Multiple sternal wires and vascular clips are seen. The cardiac silhouette is mildly enlarged and unchanged in size. There is marked severity calcification of the aortic arch. Mild prominence of the perihilar pulmonary vasculature is seen. Low lung volumes are noted with mild atelectasis present within the left lung base. No pleural effusion or pneumothorax is identified. Multilevel degenerative changes are seen throughout the thoracic spine. IMPRESSION: 1. Stable cardiomegaly with evidence of prior median sternotomy/CABG. 2. Low lung volumes with mild left basilar atelectasis. Electronically Signed   By: Aram Candela M.D.   On: 07/21/2023 19:25    Procedures Procedures    Medications Ordered in ED Medications  morphine (PF) 4 MG/ML injection 4 mg (4 mg Intravenous Given 07/21/23 1848)  LORazepam (ATIVAN) injection 0.5 mg (0.5 mg Intravenous Given 07/21/23 1906)  HYDROmorphone  (DILAUDID) injection 0.5 mg (0.5 mg Intravenous Given 07/21/23 2133)    ED Course/ Medical Decision Making/ A&P                                 Medical Decision Making This patient presents to the ED for concern of abdominal and chest pain, this involves an extensive number of treatment options, and is a complaint that carries with it a high risk of complications and morbidity.  The differential diagnosis includes abdominal infection, bowel obstruction, UTI, pyelonephritis, CAD/ACS, PNA, PTX, PE   Co morbidities that complicate the patient evaluation  Chronic AF on Elqiuis, HTN, remote h/o diverticulitis, HLD, CHF, CAD   Additional history obtained:  Additional history and/or information obtained from chart review, notable for spouse - she reports symptoms worsening over the last 3 weeks.    Lab Tests:  I Ordered, and personally interpreted labs.  The pertinent results include:  Cr 5.0 (baseline), potassium 3.4, troponin x 2 normal at 15. WBC 10.6, hgb 14.6   Imaging Studies ordered:  I ordered imaging studies including CT abd/pel Radiologist interpretation:  IMPRESSION: 1. Intermediate-grade small-bowel obstruction with transition point in the distal ileum in the central abdomen right  of midline, most likely due to adhesive disease. 2. There is also a clockwise swirling of the mesenteric vessels in the right lower abdomen separate from this consistent with an internal hernia, but no bowel is seen entrapped within the swirling. There are mesenteric congestive changes in the area of vascular swirling, not seen elsewhere. No bowel pneumatosis or portal venous gas at this time. Surgical consult recommended. 3. Cholelithiasis with mild dilatation of the gallbladder but no wall thickening or biliary dilatation. 4. Gastric lap band positioning is physiologic with upper limit of normal phi angle of 54 degrees. 5. Cardiomegaly, aortic and coronary artery atherosclerosis. 6.  Diverticulosis without evidence of diverticulitis. 7. Mild prostatomegaly. 8. Osteopenia and degenerative change. 9. Small calcified pleural plaque along the dome of the right hemidiaphragm.    Cardiac Monitoring:  The patient was maintained on a cardiac monitor.  I personally viewed and interpreted the cardiac monitored which showed an underlying rhythm of: AF, normal rate   Medicines ordered and prescription drug management:  I ordered medication including Morphine  for pain Reevaluation of the patient after these medicines showed that the patient improved I have reviewed the patients home medicines and have made adjustments as needed  EKG reviewed and shows a prolonged QTc of 565 with history of same. Ativan 0.5 mg given to help with nausea.   Test Considered:  CT abd/pel - eval for obstruction, diverticulitis, perforation   Critical Interventions:  NG tube placed   Consultations Obtained:  I requested consultation with the Dr. Hillery Hunter, surgery,  and discussed lab and imaging findings as well as pertinent plan - they recommend: NG tube ok with respect to the gastric band and recommended; admit to medicine, prefer [facility] given cardiac history. No antibiotics at this point.    Problem List / ED Course:  Patient with 5 days of abdominal pain as detailed in HPI Complicated medical history, including chronic anticoagulation, cardiac history, CKD Found to have SBO - discussed with surgery Hillery Hunter) Admit to hospitalist - [facility]   Reevaluation:  After the interventions noted above, I reevaluated the patient and found that they have :improved   Social Determinants of Health:  Supportive family   Disposition:  After consideration of the diagnostic results and the patients response to treatment, I feel that the patient would benefit from admission.   Amount and/or Complexity of Data Reviewed Labs: ordered. Radiology: ordered.  Risk Prescription drug  management.           Final Clinical Impression(s) / ED Diagnoses Final diagnoses:  Small bowel obstruction [facility])    Rx / DC Orders ED Discharge Orders     None         Danne Harbor 07/21/23 2204    Arby Barrette, MD 07/21/23 2229

## 2023-07-21 NOTE — ED Notes (Signed)
Pt spouse requested if pt could take at-home med, eliquis. EDP approved

## 2023-07-21 NOTE — ED Notes (Signed)
Lab called to add BNP

## 2023-07-21 NOTE — ED Triage Notes (Signed)
Pt states he is having abdominal pain, feels bloated .  Having chest pressure.  States this has been going on for 3 weeks but today is the worst.   Hx of CKD, atrial fib, DM and CHF

## 2023-07-22 ENCOUNTER — Inpatient Hospital Stay (HOSPITAL_COMMUNITY): Payer: No Typology Code available for payment source

## 2023-07-22 DIAGNOSIS — K56609 Unspecified intestinal obstruction, unspecified as to partial versus complete obstruction: Secondary | ICD-10-CM

## 2023-07-22 DIAGNOSIS — N184 Chronic kidney disease, stage 4 (severe): Secondary | ICD-10-CM | POA: Diagnosis present

## 2023-07-22 LAB — CBC
HCT: 40.1 % (ref 39.0–52.0)
HCT: 37.4 % — ABNORMAL LOW (ref 39.0–52.0)
Hemoglobin: 13.8 g/dL (ref 13.0–17.0)
Hemoglobin: 12.9 g/dL — ABNORMAL LOW (ref 13.0–17.0)
MCH: 32.2 pg (ref 26.0–34.0)
MCH: 31.8 pg (ref 26.0–34.0)
MCHC: 34.4 g/dL (ref 30.0–36.0)
MCHC: 34.5 g/dL (ref 30.0–36.0)
MCV: 93.5 fL (ref 80.0–100.0)
MCV: 92.1 fL (ref 80.0–100.0)
Platelets: 185 10*3/uL (ref 150–400)
Platelets: 175 10*3/uL (ref 150–400)
RBC: 4.29 MIL/uL (ref 4.22–5.81)
RBC: 4.06 MIL/uL — ABNORMAL LOW (ref 4.22–5.81)
RDW: 12.3 % (ref 11.5–15.5)
RDW: 12.3 % (ref 11.5–15.5)
WBC: 7.1 10*3/uL (ref 4.0–10.5)
WBC: 7.2 10*3/uL (ref 4.0–10.5)
nRBC: 0 % (ref 0.0–0.2)
nRBC: 0 % (ref 0.0–0.2)

## 2023-07-22 LAB — COMPREHENSIVE METABOLIC PANEL
ALT: 12 U/L (ref 0–44)
AST: 17 U/L (ref 15–41)
Albumin: 3.6 g/dL (ref 3.5–5.0)
Alkaline Phosphatase: 77 U/L (ref 38–126)
Anion gap: 9 (ref 5–15)
BUN: 60 mg/dL — ABNORMAL HIGH (ref 8–23)
CO2: 25 mmol/L (ref 22–32)
Calcium: 9.4 mg/dL (ref 8.9–10.3)
Chloride: 102 mmol/L (ref 98–111)
Creatinine, Ser: 4.62 mg/dL — ABNORMAL HIGH (ref 0.61–1.24)
GFR, Estimated: 12 mL/min — ABNORMAL LOW (ref >60)
Glucose, Bld: 146 mg/dL — ABNORMAL HIGH (ref 70–99)
Potassium: 3.3 mmol/L — ABNORMAL LOW (ref 3.5–5.1)
Sodium: 136 mmol/L (ref 135–145)
Total Bilirubin: 0.7 mg/dL (ref <1.2)
Total Protein: 5.9 g/dL — ABNORMAL LOW (ref 6.5–8.1)

## 2023-07-22 LAB — CREATININE, SERUM
Creatinine, Ser: 4.74 mg/dL — ABNORMAL HIGH (ref 0.61–1.24)
GFR, Estimated: 12 mL/min — ABNORMAL LOW (ref >60)

## 2023-07-22 LAB — GLUCOSE, CAPILLARY
Glucose-Capillary: 176 mg/dL — ABNORMAL HIGH (ref 70–99)
Glucose-Capillary: 140 mg/dL — ABNORMAL HIGH (ref 70–99)
Glucose-Capillary: 147 mg/dL — ABNORMAL HIGH (ref 70–99)
Glucose-Capillary: 135 mg/dL — ABNORMAL HIGH (ref 70–99)
Glucose-Capillary: 115 mg/dL — ABNORMAL HIGH (ref 70–99)

## 2023-07-22 LAB — LACTIC ACID, PLASMA
Lactic Acid, Venous: 2.2 mmol/L (ref 0.5–1.9)
Lactic Acid, Venous: 1.5 mmol/L (ref 0.5–1.9)

## 2023-07-22 MED ORDER — DIATRIZOATE MEGLUMINE & SODIUM 66-10 % PO SOLN
90.0000 mL | Freq: Once | ORAL | Status: AC
  Administered 2023-07-22: 90 mL via NASOGASTRIC
  Filled 2023-07-22: qty 90

## 2023-07-22 MED ORDER — HYDROMORPHONE HCL 1 MG/ML IJ SOLN
1.0000 mg | INTRAMUSCULAR | Status: DC | PRN
  Filled 2023-07-22: qty 1

## 2023-07-22 MED ORDER — INSULIN ASPART 100 UNIT/ML IJ SOLN
0.0000 [IU] | Freq: Three times a day (TID) | INTRAMUSCULAR | Status: DC
Start: 2023-07-22 — End: 2023-07-24
  Administered 2023-07-22 (×3): 1 [IU] via SUBCUTANEOUS
  Administered 2023-07-23 – 2023-07-24 (×4): 2 [IU] via SUBCUTANEOUS

## 2023-07-22 MED ORDER — DICLOFENAC SODIUM 1 % EX GEL
2.0000 g | Freq: Four times a day (QID) | CUTANEOUS | Status: DC | PRN
  Administered 2023-07-23: 2 g via TOPICAL
  Filled 2023-07-22: qty 100

## 2023-07-22 MED ORDER — DIPHENHYDRAMINE HCL 50 MG/ML IJ SOLN
25.0000 mg | Freq: Once | INTRAMUSCULAR | Status: AC
  Administered 2023-07-22: 25 mg via INTRAVENOUS
  Filled 2023-07-22: qty 1

## 2023-07-22 MED ORDER — HYDROMORPHONE HCL 1 MG/ML IJ SOLN
0.5000 mg | INTRAMUSCULAR | Status: DC | PRN
  Administered 2023-07-22 (×3): 0.5 mg via INTRAVENOUS
  Filled 2023-07-22 (×3): qty 0.5

## 2023-07-22 MED ORDER — INSULIN ASPART 100 UNIT/ML IJ SOLN: 0.0000 [IU] | Freq: Every day | INTRAMUSCULAR | Status: DC

## 2023-07-22 MED ORDER — LACTATED RINGERS IV BOLUS
500.0000 mL | Freq: Once | INTRAVENOUS | Status: AC
  Administered 2023-07-22: 500 mL via INTRAVENOUS

## 2023-07-22 MED ORDER — ENOXAPARIN SODIUM 30 MG/0.3ML IJ SOSY
30.0000 mg | PREFILLED_SYRINGE | Freq: Every day | INTRAMUSCULAR | Status: DC
  Administered 2023-07-22 – 2023-07-24 (×3): 30 mg via SUBCUTANEOUS
  Filled 2023-07-22 (×3): qty 0.3

## 2023-07-22 MED ORDER — ONDANSETRON HCL 4 MG/2ML IJ SOLN: 4.0000 mg | Freq: Four times a day (QID) | INTRAMUSCULAR | Status: DC | PRN

## 2023-07-22 MED ORDER — POTASSIUM CHLORIDE 2 MEQ/ML IV SOLN
INTRAVENOUS | Status: DC
  Filled 2023-07-22: qty 1000

## 2023-07-22 MED ORDER — ONDANSETRON HCL 4 MG PO TABS: 4.0000 mg | ORAL_TABLET | Freq: Four times a day (QID) | ORAL | Status: DC | PRN

## 2023-07-22 MED ORDER — MORPHINE SULFATE (PF) 2 MG/ML IV SOLN: 1.0000 mg | INTRAVENOUS | Status: DC | PRN

## 2023-07-22 MED ORDER — SODIUM CHLORIDE 0.9 % IV SOLN: INTRAVENOUS | Status: DC

## 2023-07-22 MED ORDER — FENTANYL CITRATE PF 50 MCG/ML IJ SOSY
12.5000 ug | PREFILLED_SYRINGE | INTRAMUSCULAR | Status: DC | PRN
  Administered 2023-07-23 (×2): 12.5 ug via INTRAVENOUS
  Filled 2023-07-22 (×3): qty 1

## 2023-07-22 MED ORDER — DIPHENHYDRAMINE-ZINC ACETATE 2-0.1 % EX CREA
TOPICAL_CREAM | Freq: Three times a day (TID) | CUTANEOUS | Status: DC | PRN
  Filled 2023-07-22: qty 28

## 2023-07-22 MED ORDER — LACTATED RINGERS IV BOLUS: 500.0000 mL | Freq: Once | INTRAVENOUS | Status: DC

## 2023-07-22 NOTE — Plan of Care (Signed)

## 2023-07-22 NOTE — H&P (Signed)
History and Physical    Patient: [name] HKV:[id number] DOB: August 22, 1946 DOA: 07/21/2023 DOS: the patient was seen and examined on 07/22/2023 PCP: Daisy Floro, MD  Patient coming from: Home  Chief Complaint:  Chief Complaint  Patient presents with   Abdominal Pain   HPI: [name] is a 77 y.o. male with medical history significant of coronary artery disease, chronic diastolic heart failure, history of gastric banding, paroxysmal atrial fibrillation on chronic Eliquis, essential hypertension, hyperlipidemia, PTSD, type 2 diabetes non-insulin-dependent who presented to [facility] ER with 5 days of progressive abdominal discomfort nausea but no vomiting.  Patient also has had no hematemesis or melena no bright red blood per rectum.  He has no prior abdominal pathology except for his gastric banding.  Patient was seen and evaluated.  CT abdomen pelvis showed small bowel obstruction.  NG tube was not started.  Surgery consulted Dr. Hillery Hunter who agrees to consult on patient admitted to medical service.  Patient has no fever or chills and no other significant complaints.  Review of Systems: As mentioned in the history of present illness. All other systems reviewed and are negative. Past Medical History:  Diagnosis Date   Allergic rhinitis    Anxiety    Atrial fibrillation (HCC)    Cancer (HCC)    hx of skin cancers    Chronic kidney disease    Coronary artery disease    a. s/p MI & CABG; b. s/p PCI Diag;  c. Cath 12/2011: LAD diff dzs/small, Diag 75% isr, 90 dist to stent (small), LCX & RCA occluded, VG->OM 40, VG->PDA patent - med rx.   COVID-19    x 3   Depression    PTSD   Diabetes mellitus    not on medications   Diastolic CHF, chronic (HCC)    a. EF 55-60% by echo 2007   GERD (gastroesophageal reflux disease)    "not anymore" " I had a lap band"   History of diverticulitis of colon    5 YRS AGO   History of kidney stones    History of pneumonia     2017   History of transient ischemic attack (TIA)    CAROTID DOPPLERS NOV 2011  0-39& BIL. 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/25/2016   Complex valvuloplasty including artificial Gore-tex neochord placement x4, plication of anterior commissure, and 26 mm Sorin Memo 3D ring annuloplasty via right mini thoracotomy approach   Shortness of breath dyspnea    with exertion   Skin cancer    Sleep apnea    cpap- see ov note in EPIC 05/12/11 for settings    Stroke [facility])    hx of   Stroke [facility])    Past Surgical History:  Procedure Laterality Date   AV FISTULA PLACEMENT Left 08/02/2018   Procedure: ARTERIOVENOUS (AV) FISTULA CREATION RADIOCEPHALIC;  Surgeon: Chuck Hint, MD;  Location: [facility] OR;  Service: Vascular;  Laterality: Left;   BLEPHAROPLASTY  1985   BILATERAL   CARDIAC CATHETERIZATION  2001, 2004, 2009, 2011  CARDIAC CATHETERIZATION N/A 01/23/2016   Procedure: Right/Left Heart Cath and Coronary Angiography;  Surgeon: Laurey Morale, MD;  Location: [facility] INVASIVE CV LAB;  Service: Cardiovascular;  Laterality: N/A;   CARDIOVERSION N/A 09/07/2017   Procedure: CARDIOVERSION;  Surgeon: Laurey Morale, MD;  Location: [facility] ENDOSCOPY;  Service: Cardiovascular;  Laterality: N/A;   CARDIOVERSION N/A 09/13/2019   Procedure: CARDIOVERSION;  Surgeon: Laurey Morale, MD;  Location: [facility] ENDOSCOPY;  Service: Cardiovascular;  Laterality: N/A;   CARDIOVERSION N/A 10/19/2019   Procedure: CARDIOVERSION;  Surgeon: Laurey Morale, MD;  Location: [facility] ENDOSCOPY;  Service: Cardiovascular;  Laterality: N/A;   CARDIOVERSION N/A 04/14/2022   Procedure:  CARDIOVERSION;  Surgeon: Laurey Morale, MD;  Location: [facility] ENDOSCOPY;  Service: Cardiovascular;  Laterality: N/A;   CARDIOVERSION N/A 02/22/2023   Procedure: CARDIOVERSION;  Surgeon: Laurey Morale, MD;  Location: [facility] INVASIVE CV LAB;  Service: Cardiovascular;  Laterality: N/A;   CARDIOVERSION N/A 03/12/2023   Procedure: CARDIOVERSION;  Surgeon: Laurey Morale, MD;  Location: [facility] INVASIVE CV LAB;  Service: Cardiovascular;  Laterality: N/A;   CATARACT EXTRACTION W/ INTRAOCULAR LENS IMPLANT Bilateral    CHONDROPLASTY  07/22/2011   Procedure: CHONDROPLASTY;  Surgeon: Loanne Drilling;  Location: [facility];  Service: Orthopedics;;   CIRCUMCISION  35 YRS  AGO   COLONOSCOPY     CORONARY ANGIOPLASTY  1996-- POST CABG   W/ STENT, last cath 01/07/2012    CORONARY ARTERY BYPASS GRAFT  1995   X3 VESSEL   CORONARY ARTERY BYPASS GRAFT  1995   CORONARY STENT PLACEMENT     CYSTOSCOPY W/ URETERAL STENT PLACEMENT Right 04/11/2022   Procedure: CYSTOSCOPY WITH RETROGRADE PYELOGRAM/URETERAL STENT PLACEMENT;  Surgeon: Malen Gauze, MD;  Location: [facility] ORS;  Service: Urology;  Laterality: Right;   CYSTOSCOPY/URETEROSCOPY/HOLMIUM LASER/STENT PLACEMENT Right 01/08/2021   Procedure: CYSTOSCOPY/RETROGRADE/URETEROSCOPY/HOLMIUM LASER/STENT PLACEMENT;  Surgeon: Marcine Matar, MD;  Location: [facility] ORS;  Service: Urology;  Laterality: Right;   CYSTOSCOPY/URETEROSCOPY/HOLMIUM LASER/STENT PLACEMENT Right 05/27/2022   Procedure: CYSTOSCOPY, URETEROSCOPY, HOLMIUM LASER, STENT EXCHANGE;  Surgeon: Rene Paci, MD;  Location: [facility] ORS;  Service: Urology;  Laterality: Right;   INCISION AND DRAINAGE PERIRECTAL ABSCESS N/A 09/01/2021   Procedure: IRRIGATION AND DEBRIDEMENT PERIRECTAL ABSCESS;  Surgeon: Sheliah Hatch De Blanch, MD;  Location: [facility] ORS;  Service: General;  Laterality: N/A;   IR PERC TUN PERIT CATH WO PORT S&I Judi Cong  03/06/2021   IR REMOVAL TUN CV CATH W/O FL  04/14/2021   KNEE ARTHROSCOPY   07/22/2011   Procedure: ARTHROSCOPY KNEE;  Surgeon: Loanne Drilling;  Location: [facility];  Service: Orthopedics;  Laterality: Right;  WITH DEBRIDEMENT    KNEE ARTHROSCOPY W/ MENISCECTOMY  X2 IN 2002-- RIGHT KNEE   LAPAROSCOPIC GASTRIC BANDING  01-15-09   LEFT HEART CATH AND CORONARY ANGIOGRAPHY N/A 04/29/2017   Procedure: LEFT HEART CATH AND CORONARY ANGIOGRAPHY;  Surgeon: Laurey Morale, MD;  Location: [facility] INVASIVE CV LAB;  Service: Cardiovascular;  Laterality: N/A;   LEFT HEART CATHETERIZATION WITH CORONARY ANGIOGRAM N/A 09/11/2013   Procedure: LEFT HEART CATHETERIZATION WITH CORONARY ANGIOGRAM;  Surgeon: Laurey Morale, MD;  Location: [facility] CATH LAB;  Service: Cardiovascular;  Laterality: N/A;   MITRAL VALVE REPAIR Right 03/25/2016   Procedure: MINIMALLY INVASIVE REOPERATION FOR MITRAL VALVE REPAIR  (MVR) with size 26 Sorin Memo 3D;  Surgeon: Purcell Nails, MD;  Location: [facility] OR;  Service: Open Heart Surgery;  Laterality: Right;   MITRAL VALVE REPAIR  03/2016   RIGHT KNEE ED COMPARTMENT REPLACEMENT  2004   SYNOVECTOMY  07/22/2011   Procedure: SYNOVECTOMY;  Surgeon: Gus Rankin Aluisio;  Location: [facility];  Service: Orthopedics;;   TEE WITHOUT CARDIOVERSION N/A 01/23/2016   Procedure: TRANSESOPHAGEAL ECHOCARDIOGRAM (TEE);  Surgeon: Laurey Morale, MD;  Location: [facility] ENDOSCOPY;  Service: Cardiovascular;  Laterality: N/A;   TEE WITHOUT CARDIOVERSION N/A 03/25/2016   Procedure: TRANSESOPHAGEAL ECHOCARDIOGRAM (TEE);  Surgeon: Purcell Nails, MD;  Location: [facility] OR;  Service: Open Heart Surgery;  Laterality: N/A;   TEE WITHOUT CARDIOVERSION N/A 03/04/2021   Procedure: TRANSESOPHAGEAL ECHOCARDIOGRAM (TEE);  Surgeon: Lewayne Bunting, MD;  Location: [facility] ENDOSCOPY;  Service: Cardiovascular;  Laterality: N/A;   TEE WITHOUT CARDIOVERSION N/A 04/14/2022   Procedure: TRANSESOPHAGEAL ECHOCARDIOGRAM (TEE);  Surgeon: Laurey Morale, MD;  Location: [facility] ENDOSCOPY;  Service:  Cardiovascular;  Laterality: N/A;   UMBILICAL HERNIA REPAIR  01-15-09   W/ GASTRIC BANDING PROCEDURE   Social History:  reports that he has quit smoking. His smoking use included cigarettes. He has never used smokeless tobacco. He reports that he does not currently use alcohol after a past usage of about 2.0 standard drinks of alcohol per week. He reports that he does not use drugs.  Allergies  Allergen Reactions   Crestor [Rosuvastatin] Other (See Comments)    Muscle aches    Lipitor [Atorvastatin] Other (See Comments)    Muscle aches   Pravastatin Other (See Comments)    Muscle aches   Carvedilol Other (See Comments)    loss of appetite   Metformin Diarrhea   Serotonin Reuptake Inhibitors (Ssris) Other (See Comments)    Muscle aches   Zocor [Simvastatin] Other (See Comments)     Muscle pain   Codeine Itching and Other (See Comments)    Extremity tingling-- can take synthetic    Family History  Problem Relation Age of Onset   Other Mother        joint problems   Alzheimer's disease Mother    Hypertension Father    Heart disease Father    CVA Father        age 42   Depression Father    Heart disease Sister        CABG   Stroke Sister    Heart failure Sister        congestive   Diabetes Sister    Heart disease Brother    Hypertension Brother    Congestive Heart Failure Brother     Prior to Admission medications   Medication Sig Start Date End Date Taking? Authorizing Provider  albuterol (VENTOLIN HFA) 108 (90 Base) MCG/ACT inhaler Inhale 1-2 puffs into the lungs every 6 (six) hours as needed for shortness of breath or wheezing.    [provider]  Alirocumab (PRALUENT) 75 MG/ML SOAJ Inject 75 mg into the skin every 14 (fourteen) days.    [provider]  allopurinol (ZYLOPRIM) 100 MG tablet Take 50 mg by mouth 2 (two) times a week.    [provider]  apixaban (ELIQUIS) 5 MG TABS tablet Take 1 tablet (5 mg total) by mouth 2 (two) times  daily. 09/05/21   Rolly Salter, MD  busPIRone (BUSPAR) 10 MG tablet Take 5 mg by mouth 2 (two) times daily.    [provider]  cetirizine (ZYRTEC) 10 MG tablet Take 10 mg by mouth in the morning.    [provider]  colchicine 0.6 MG tablet Take 0.6-1.2  mg by mouth 2 (two) times daily as needed (gout). 04/14/19   [provider]  Cyanocobalamin (VITAMIN B-12) 5000 MCG TBDP Take 5,000 mcg by mouth 2 (two) times a week.    [provider]  cycloSPORINE (RESTASIS) 0.05 % ophthalmic emulsion Place 1 drop into both eyes 2 (two) times daily as needed (eye irritation.).    [provider]  diclofenac Sodium (VOLTAREN) 1 % GEL Apply 2 g topically 3 (three) times daily as needed for pain. 07/05/21   [provider]  docusate sodium (COLACE) 100 MG capsule Take 200 mg by mouth at bedtime.    [provider]  ezetimibe (ZETIA) 10 MG tablet Take 1 tablet (10 mg total) by mouth daily. 12/23/22   Laurey Morale, MD  ferrous sulfate 325 (65 FE) MG tablet Take 325 mg by mouth every other day.    [provider]  fluticasone (FLONASE) 50 MCG/ACT nasal spray Place 1 spray into both nostrils daily as needed for allergies or rhinitis.    [provider]  furosemide (LASIX) 40 MG tablet Take 3 tablets (120 mg total) by mouth 2 (two) times daily. Please cancel all previous orders for current medication. Change in dosage or pill size. 06/09/23   Laurey Morale, MD  glipiZIDE (GLUCOTROL) 5 MG tablet Take 0.5-1 tablets (2.5-5 mg total) by mouth daily before supper. 07/15/23   Carlus Pavlov, MD  HYDROcodone-acetaminophen (NORCO) 10-325 MG tablet Take 1 tablet by mouth 3 (three) times daily as needed for moderate pain. 12/02/22   [provider]  Meclizine HCl 25 MG CHEW Chew 25 mg by mouth 3 (three) times daily as needed (dizziness). 12/07/22   [provider]  methocarbamol (ROBAXIN) 500 MG tablet Take 500 mg by mouth 3  (three) times daily as needed for muscle spasms. 08/20/21   [provider]  metoprolol succinate (TOPROL-XL) 50 MG 24 hr tablet Take 0.5 tablets (25 mg total) by mouth daily. Take with or immediately following a meal. 06/09/23   Laurey Morale, MD  Multiple Vitamin (MULTIVITAMIN) capsule Take 1 capsule by mouth in the morning.    [provider]  nitroGLYCERIN (NITROSTAT) 0.4 MG SL tablet 1 TAB UNDER THE TONGUE EVERY 5 MINUTES AS NEEDED FOR CHEST PAIN UP TO 3 DOSES, IF PERSIST CALL 911 04/08/23   Robbie Lis M, PA-C  ONETOUCH VERIO test strip 1 each by Other route in the morning and at bedtime. 05/05/23   Carlus Pavlov, MD  pantoprazole (PROTONIX) 40 MG tablet Take 40 mg by mouth daily as needed (heartburn).    [provider]  potassium chloride SA (KLOR-CON M) 20 MEQ tablet Take 20 mEq by mouth daily.    [provider]  Semaglutide, 1 MG/DOSE, (OZEMPIC, 1 MG/DOSE,) 4 MG/3ML SOPN Inject 1 mg into the skin once a week. 07/15/23   Carlus Pavlov, MD  tadalafil (CIALIS) 20 MG tablet Take 20 mg by mouth daily as needed for erectile dysfunction.    [provider]  testosterone cypionate (DEPOTESTOSTERONE CYPIONATE) 200 MG/ML injection Inject 200 mg into the muscle once a week. 02/26/23   [provider]  traZODone (DESYREL) 100 MG tablet Take 100 mg by mouth at bedtime.    [provider]    Physical Exam: Vitals:   07/21/23 2215 07/21/23 2230 07/21/23 2300 07/21/23 2358  BP:  110/72 (!) 117/90 135/83  Pulse:  75 77 82  Resp: 17 (!) 22 20 (!) 22  Temp: 98.3  F (36.8 C)   (!) 97.5 F (36.4 C)  TempSrc: Oral   Oral  SpO2:  95% 96% 99%  Weight:      Height:       Constitutional: Acutely ill looking, NAD, calm, comfortable Eyes: PERRL, lids and conjunctivae normal ENMT: Mucous membranes are dry.  Posterior pharynx clear of any exudate or lesions.Normal dentition.  Neck: normal, supple, no masses, no  thyromegaly Respiratory: clear to auscultation bilaterally, no wheezing, no crackles. Normal respiratory effort. No accessory muscle use.  Cardiovascular: Regular rate and rhythm, no murmurs / rubs / gallops. No extremity edema. 2+ pedal pulses. No carotid bruits.  Abdomen: Mildly distended with NG tube in place. No hepatosplenomegaly. Bowel sounds positive.  Musculoskeletal: Good range of motion, no joint swelling or tenderness, Skin: no rashes, lesions, ulcers. No induration Neurologic: CN 2-12 grossly intact. Sensation intact, DTR normal. Strength 5/5 in all 4.  Psychiatric: Normal judgment and insight. Alert and oriented x 3. Normal mood  Data Reviewed:  Sodium 136, potassium 3.4, glucose 219 BUN 56 creatinine 5.0, LFTs largely within normal.  White count is 10.6 with hemoglobin platelets within normal.  Analysis largely negative chest x-ray showed no acute findings.  CT abdomen pelvis shows intermediate grade small bowel obstruction with transition point in the distal ileum in the central abdomen right of midline probably due to adhesive disease.  Also possible internal hernia with mesenteric vessels involvement.  Evidence of cholelithiasis with mild dilatation of the gallbladder  Assessment and Plan:  #1 small bowel obstruction: Patient has NG tube in place.  IV fluids goal will.  He does have CHF but currently significant third spacing so we will continue with IV fluid.  Surgery consulted.  Keep NPO.  Supportive care and follow surgical recommendations.  #2 non-insulin-dependent diabetes: Continue with sliding scale insulin.  #3 essential hypertension: Blood pressure is low at the moment.  Will use IV beta-blockers for while NPO.  #4 coronary artery disease: Stable at the moment.  Will resume chronic medications at appropriate time.  #5 chronic atrial fibrillation: Appears to be in sinus rhythm now.  We will hold anticoagulation in case of possible surgery.  Rate is controlled.  #6  chronic diastolic heart failure: Appears almost compensated.  Continue close monitoring.  #7 obstructive sleep apnea: Offer CPAP at night.  #8 mixed hyperlipidemia: Resume statin when small bowel obstruction resolves.    Advance Care Planning:   Code Status: Full Code   Consults: [facility] Dr. Hillery Hunter  Family Communication: No family at bedside  Severity of Illness: The appropriate patient status for this patient is INPATIENT. Inpatient status is judged to be reasonable and necessary in order to provide the required intensity of service to ensure the patient's safety. The patient's presenting symptoms, physical exam findings, and initial radiographic and laboratory data in the context of their chronic comorbidities is felt to place them at high risk for further clinical deterioration. Furthermore, it is not anticipated that the patient will be medically stable for discharge from the hospital within 2 midnights of admission.   * I certify that at the point of admission it is my clinical judgment that the patient will require inpatient hospital care spanning beyond 2 midnights from the point of admission due to high intensity of service, high risk for further deterioration and high frequency of surveillance required.*  AuthorLonia Blood, MD 07/22/2023 12:19 AM  For on call review [email or web address].

## 2023-07-22 NOTE — Progress Notes (Signed)
    Patient Name: COLLIER KIRCHBERG           DOB: 1945/08/25  MRN: [id number]      Admission Date: 07/21/2023  Attending Provider: Burnadette Pop, MD  Primary Diagnosis: SBO (small bowel obstruction) [facility])   Level of care: Telemetry Medical    CROSS COVER NOTE   Date of Service   07/22/2023   NADIR KENASTON, 77 y.o. male, was admitted on 07/21/2023 for SBO (small bowel obstruction) (HCC).    HPI/Events of Note   Patient complains of "excessive itching" shortly after starting IV Dilaudid. Has a hx of taking Norco without complication.  Patient is currently strict n.p.o. for SBO. Given CKD stage V, will avoid morphine, NSAID use for pain management.  Will trial IV fentanyl.   Interventions/ Plan   IV fentanyl for pain control IV Benadryl x 1        Anthoney Harada, DNP, [employer] [facility] [employer]

## 2023-07-22 NOTE — Progress Notes (Signed)
Brief same day note:   Patient is a 77 year old male with history of coronary artery  disease, chronic diastolic CHF, og gastric banding, paroxysmal A-fib on Eliquis, hypertension, hyperlipidemia, PTSD, type 2 diabetes who presented with 5-day history of progressive abdomen pain, nausea.  CT abdomen/pelvis showed small bowel obstruction.  General surgery consulted.  Patient seen and examined at bedside today.  Appears overall comfortable.  Denies any significant abdominal pain, nausea or vomiting.  Abdomen not significantly distended or tender.  Passing gas.  Has good bowel sounds. Discussed our management plan with wife at bedside.    Assessment and plan  SBO: Presented with abdominal pain, nausea.  Started on conservative management.  NG tube, IV fluid, antiemetics, analgesics.  General surgery following  CKD stage V: Baseline creatinine in the range of 5.  Currently kidney function at baseline.Follows with Dr. Glenna Fellows, [place] [facility].  Has AV fistula on the left forearm  Diabetes 2: Currently on sliding scale.  Monitor blood sugars  Hypertension: Antihypertensives on hold.  Coronary artery disease: No anginal symptoms  Paroxysmal A-fib.  Currently in normal sinus rhythm.  Anticoagulation on hold.  Rate is controlled  Chronic diastolic CHF: On gentle IV fluids  OSA: On CPAP  Hypokalemia: Supplemented, will be monitored  Hyperlipidemia: On statin

## 2023-07-22 NOTE — Consult Note (Signed)
Reason for Consult/Chief Complaint: SBO Referring Provider: Newton Pigg, MD  HPI  [name] is an 77 y.o. male with complex medical history significant for CAD, chronic diastolic heart failure, history og gastric banding, pAfib on Eliquis, HTN, HLD, PTSD, DM2, stroke with residual RLE weakness who presents to [facility] with abdominal pain and nausea. Patient states these symptoms have been presents for ~3 weeks and have waxed and waned in severity.  Denies any abdominal surgery besides gastric banding. Denies fevers/chills. Complains of some vague right sided abdominal pain currently but also says sometimes it migrates to left. Has had no melena or hematemesis. CT scan obtained shows SBO. Concern for some mesenteric swirling of vessels. WBC 10.6. Lactate initially 2.2 and improved to 1.5. Last took Eliquis last night   10 point review of systems is negative except as listed above in HPI.  Objective  Past Medical History: Past Medical History:  Diagnosis Date   Allergic rhinitis    Anxiety    Atrial fibrillation (HCC)    Cancer (HCC)    hx of skin cancers    Chronic kidney disease    Coronary artery disease    a. s/p MI & CABG; b. s/p PCI Diag;  c. Cath 12/2011: LAD diff dzs/small, Diag 75% isr, 90 dist to stent (small), LCX & RCA occluded, VG->OM 40, VG->PDA patent - med rx.   COVID-19    x 3   Depression    PTSD   Diabetes mellitus    not on medications   Diastolic CHF, chronic (HCC)    a. EF 55-60% by echo 2007   GERD (gastroesophageal reflux disease)    "not anymore" " I had a lap band"   History of diverticulitis of colon    5 YRS AGO   History of kidney stones    History of pneumonia    2017   History of transient ischemic attack (TIA)    CAROTID DOPPLERS NOV 2011  0-39& BIL. 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/25/2016   Complex valvuloplasty including artificial Gore-tex neochord placement x4, plication of anterior commissure, and 26 mm Sorin Memo 3D ring annuloplasty via right mini thoracotomy approach   Shortness of breath dyspnea    with exertion   Skin cancer    Sleep apnea    cpap- see ov note in EPIC 05/12/11 for settings    Stroke [facility])    hx of   Stroke [facility])     Past Surgical History: Past Surgical History:  Procedure Laterality Date   AV FISTULA PLACEMENT Left 08/02/2018   Procedure: ARTERIOVENOUS (AV) FISTULA CREATION RADIOCEPHALIC;  Surgeon: Chuck Hint, MD;  Location: [facility] OR;  Service: Vascular;  Laterality: Left;   BLEPHAROPLASTY  1985   BILATERAL   CARDIAC CATHETERIZATION  2001, 2004, 2009, 2011   CARDIAC CATHETERIZATION N/A 01/23/2016   Procedure: Right/Left Heart Cath and Coronary Angiography;  Surgeon: Laurey Morale, MD;  Location: [facility] INVASIVE CV LAB;  Service: Cardiovascular;  Laterality: N/A;   CARDIOVERSION N/A 09/07/2017   Procedure: CARDIOVERSION;  Surgeon: Laurey Morale, MD;  Location: [facility] ENDOSCOPY;  Service: Cardiovascular;  Laterality: N/A;   CARDIOVERSION N/A 09/13/2019   Procedure: CARDIOVERSION;  Surgeon: Laurey Morale, MD;  Location: [facility] ENDOSCOPY;  Service: Cardiovascular;  Laterality: N/A;   CARDIOVERSION N/A 10/19/2019   Procedure: CARDIOVERSION;  Surgeon: Laurey Morale, MD;  Location: [facility] ENDOSCOPY;  Service: Cardiovascular;  Laterality: N/A;   CARDIOVERSION N/A 04/14/2022   Procedure: CARDIOVERSION;  Surgeon: Laurey Morale, MD;  Location: [facility] ENDOSCOPY;  Service: Cardiovascular;  Laterality: N/A;   CARDIOVERSION N/A 02/22/2023   Procedure: CARDIOVERSION;  Surgeon: Laurey Morale, MD;  Location: [facility] INVASIVE CV LAB;  Service:  Cardiovascular;  Laterality: N/A;   CARDIOVERSION N/A 03/12/2023   Procedure: CARDIOVERSION;  Surgeon: Laurey Morale, MD;  Location: [facility] INVASIVE CV LAB;  Service: Cardiovascular;  Laterality: N/A;   CATARACT EXTRACTION W/ INTRAOCULAR LENS IMPLANT Bilateral    CHONDROPLASTY  07/22/2011   Procedure: CHONDROPLASTY;  Surgeon: Loanne Drilling;  Location: [facility];  Service: Orthopedics;;   CIRCUMCISION  35 YRS  AGO   COLONOSCOPY     CORONARY ANGIOPLASTY  1996-- POST CABG   W/ STENT, last cath 01/07/2012    CORONARY ARTERY BYPASS GRAFT  1995   X3 VESSEL   CORONARY ARTERY BYPASS GRAFT  1995   CORONARY STENT PLACEMENT     CYSTOSCOPY W/ URETERAL STENT PLACEMENT Right 04/11/2022   Procedure: CYSTOSCOPY WITH RETROGRADE PYELOGRAM/URETERAL STENT PLACEMENT;  Surgeon: Malen Gauze, MD;  Location: [facility] ORS;  Service: Urology;  Laterality: Right;   CYSTOSCOPY/URETEROSCOPY/HOLMIUM LASER/STENT PLACEMENT Right 01/08/2021   Procedure: CYSTOSCOPY/RETROGRADE/URETEROSCOPY/HOLMIUM LASER/STENT PLACEMENT;  Surgeon: Marcine Matar, MD;  Location: [facility] ORS;  Service: Urology;  Laterality: Right;   CYSTOSCOPY/URETEROSCOPY/HOLMIUM LASER/STENT PLACEMENT Right 05/27/2022   Procedure: CYSTOSCOPY, URETEROSCOPY, HOLMIUM LASER, STENT EXCHANGE;  Surgeon: Rene Paci, MD;  Location: [facility] ORS;  Service: Urology;  Laterality: Right;   INCISION AND DRAINAGE PERIRECTAL ABSCESS N/A 09/01/2021   Procedure: IRRIGATION AND DEBRIDEMENT PERIRECTAL ABSCESS;  Surgeon: Sheliah Hatch De Blanch, MD;  Location: [facility] ORS;  Service: General;  Laterality: N/A;   IR PERC TUN PERIT CATH WO PORT S&I Judi Cong  03/06/2021   IR REMOVAL TUN CV CATH W/O FL  04/14/2021   KNEE ARTHROSCOPY  07/22/2011   Procedure: ARTHROSCOPY KNEE;  Surgeon: Loanne Drilling;  Location: [facility];  Service: Orthopedics;  Laterality: Right;  WITH DEBRIDEMENT    KNEE ARTHROSCOPY W/ MENISCECTOMY  X2 IN 2002-- RIGHT KNEE   LAPAROSCOPIC  GASTRIC BANDING  01-15-09   LEFT HEART CATH AND CORONARY ANGIOGRAPHY N/A 04/29/2017   Procedure: LEFT HEART CATH AND CORONARY ANGIOGRAPHY;  Surgeon: Laurey Morale, MD;  Location: [facility] INVASIVE CV LAB;  Service: Cardiovascular;  Laterality: N/A;   LEFT HEART CATHETERIZATION WITH CORONARY ANGIOGRAM N/A 09/11/2013   Procedure: LEFT HEART CATHETERIZATION WITH CORONARY ANGIOGRAM;  Surgeon: Laurey Morale, MD;  Location: [facility] CATH LAB;  Service: Cardiovascular;  Laterality: N/A;   MITRAL VALVE REPAIR Right 03/25/2016   Procedure: MINIMALLY INVASIVE REOPERATION FOR MITRAL VALVE REPAIR  (MVR) with size 26 Sorin Memo 3D;  Surgeon: Purcell Nails, MD;  Location: [facility] OR;  Service: Open Heart Surgery;  Laterality: Right;   MITRAL VALVE REPAIR  03/2016   RIGHT KNEE ED COMPARTMENT REPLACEMENT  2004   SYNOVECTOMY  07/22/2011   Procedure: SYNOVECTOMY;  Surgeon: Gus Rankin Aluisio;  Location: [facility];  Service: Orthopedics;;   TEE WITHOUT CARDIOVERSION N/A 01/23/2016  Procedure: TRANSESOPHAGEAL ECHOCARDIOGRAM (TEE);  Surgeon: Laurey Morale, MD;  Location: [facility] ENDOSCOPY;  Service: Cardiovascular;  Laterality: N/A;   TEE WITHOUT CARDIOVERSION N/A 03/25/2016   Procedure: TRANSESOPHAGEAL ECHOCARDIOGRAM (TEE);  Surgeon: Purcell Nails, MD;  Location: [facility] OR;  Service: Open Heart Surgery;  Laterality: N/A;   TEE WITHOUT CARDIOVERSION N/A 03/04/2021   Procedure: TRANSESOPHAGEAL ECHOCARDIOGRAM (TEE);  Surgeon: Lewayne Bunting, MD;  Location: [facility] ENDOSCOPY;  Service: Cardiovascular;  Laterality: N/A;   TEE WITHOUT CARDIOVERSION N/A 04/14/2022   Procedure: TRANSESOPHAGEAL ECHOCARDIOGRAM (TEE);  Surgeon: Laurey Morale, MD;  Location: [facility] ENDOSCOPY;  Service: Cardiovascular;  Laterality: N/A;   UMBILICAL HERNIA REPAIR  01-15-09   W/ GASTRIC BANDING PROCEDURE    Family History:  Family History  Problem Relation Age of Onset   Other Mother        joint problems   Alzheimer's disease Mother    Hypertension  Father    Heart disease Father    CVA Father        age 80   Depression Father    Heart disease Sister        CABG   Stroke Sister    Heart failure Sister        congestive   Diabetes Sister    Heart disease Brother    Hypertension Brother    Congestive Heart Failure Brother     Social History:  reports that he has quit smoking. His smoking use included cigarettes. He has never used smokeless tobacco. He reports that he does not currently use alcohol after a past usage of about 2.0 standard drinks of alcohol per week. He reports that he does not use drugs.  Allergies:  Allergies  Allergen Reactions   Crestor [Rosuvastatin] Other (See Comments)    Muscle aches    Lipitor [Atorvastatin] Other (See Comments)    Muscle aches   Pravastatin Other (See Comments)    Muscle aches   Carvedilol Other (See Comments)    loss of appetite   Metformin Diarrhea   Serotonin Reuptake Inhibitors (Ssris) Other (See Comments)    Muscle aches   Zocor [Simvastatin] Other (See Comments)     Muscle pain   Codeine Itching and Other (See Comments)    Extremity tingling-- can take synthetic    Medications: I have reviewed the patient's current medications.    Labs: I have personally reviewed all labs for the past 24h Results for orders placed or performed during the hospital encounter of 07/21/23 (from the past 48 hour(s))  Lipase, blood     Status: Abnormal   Collection Time: 07/21/23  6:22 PM  Result Value Ref Range   Lipase 61 (H) 11 - 51 U/L    Comment: Performed at [facility], [address] [place], [place] [address]  CBC     Status: Abnormal   Collection Time: 07/21/23  6:22 PM  Result Value Ref Range   WBC 10.6 (H) 4.0 - 10.5 K/uL   RBC 4.60 4.22 - 5.81 MIL/uL   Hemoglobin 14.6 13.0 - 17.0 g/dL   HCT 19.1 47.8 - 29.5 %   MCV 94.1 80.0 - 100.0 fL   MCH 31.7 26.0 - 34.0 pg   MCHC 33.7 30.0 - 36.0 g/dL   RDW 62.1 30.8 - 65.7 %   Platelets 182 150 - 400 K/uL   nRBC  0.0 0.0 - 0.2 %    Comment: Performed at [facility], [address] [place], [place] [address]  Urinalysis, Routine w reflex microscopic -Urine, Clean Catch     Status: Abnormal   Collection Time: 07/21/23  6:22 PM  Result Value Ref Range   Color, Urine YELLOW YELLOW   APPearance CLEAR CLEAR   Specific Gravity, Urine 1.015 1.005 - 1.030   pH 5.5 5.0 - 8.0   Glucose, UA 250 (A) NEGATIVE mg/dL   Hgb urine dipstick TRACE (A) NEGATIVE   Bilirubin Urine NEGATIVE NEGATIVE   Ketones, ur NEGATIVE NEGATIVE mg/dL   Protein, ur 528 (A) NEGATIVE mg/dL   Nitrite NEGATIVE NEGATIVE   Leukocytes,Ua NEGATIVE NEGATIVE    Comment: Performed at [facility], [address] [place], [place] [address]  Hepatic function panel     Status: Abnormal   Collection Time: 07/21/23  6:22 PM  Result Value Ref Range   Total Protein 7.7 6.5 - 8.1 g/dL   Albumin 4.5 3.5 - 5.0 g/dL   AST 20 15 - 41 U/L   ALT 14 0 - 44 U/L   Alkaline Phosphatase 102 38 - 126 U/L   Total Bilirubin 1.2 (H) <1.2 mg/dL   Bilirubin, Direct 0.2 0.0 - 0.2 mg/dL   Indirect Bilirubin 1.0 (H) 0.3 - 0.9 mg/dL    Comment: Performed at [facility], [address] [place], [place] [address]  Brain natriuretic peptide     Status: Abnormal   Collection Time: 07/21/23  6:22 PM  Result Value Ref Range   B Natriuretic Peptide 295.1 (H) 0.0 - 100.0 pg/mL    Comment: Performed at [facility], [address] [place], [place] [address]  Urinalysis, Microscopic (reflex)     Status: Abnormal   Collection Time: 07/21/23  6:22 PM  Result Value Ref Range   RBC / HPF 0-5 0 - 5 RBC/hpf   WBC, UA 0-5 0 - 5 WBC/hpf   Bacteria, UA RARE (A) NONE SEEN   Squamous Epithelial / HPF 0-5 0 - 5 /HPF    Comment: Performed at [facility], [address] [place], [place] [address]  Troponin I (High Sensitivity)     Status: None   Collection Time: 07/21/23  6:48 PM  Result Value Ref Range   Troponin I  (High Sensitivity) 15 <18 ng/L    Comment: (NOTE) Elevated high sensitivity troponin I (hsTnI) values and significant  changes across serial measurements may suggest ACS but many other  chronic and acute conditions are known to elevate hsTnI results.  Refer to the "Links" section for chest pain algorithms and additional  guidance. Performed at [facility], [address] [place], [place] [address]   I-stat chem 8, ED (not at [facility], [facility] or [facility])     Status: Abnormal   Collection Time: 07/21/23  6:59 PM  Result Value Ref Range   Sodium 136 135 - 145 mmol/L   Potassium 3.4 (L) 3.5 - 5.1 mmol/L   Chloride 99 98 - 111 mmol/L   BUN 56 (H) 8 - 23 mg/dL   Creatinine, Ser 9.56 (H) 0.61 - 1.24 mg/dL   Glucose, Bld 387 (H) 70 - 99 mg/dL    Comment: Glucose reference range applies only to samples taken after fasting for at least 8 hours.   Calcium, Ion 1.23 1.15 - 1.40 mmol/L   TCO2 24 22 - 32 mmol/L   Hemoglobin 14.3 13.0 - 17.0 g/dL   HCT 56.4 33.2 -  52.0 %  Troponin I (High Sensitivity)     Status: None   Collection Time: 07/21/23  8:35 PM  Result Value Ref Range   Troponin I (High Sensitivity) 15 <18 ng/L    Comment: (NOTE) Elevated high sensitivity troponin I (hsTnI) values and significant  changes across serial measurements may suggest ACS but many other  chronic and acute conditions are known to elevate hsTnI results.  Refer to the "Links" section for chest pain algorithms and additional  guidance. Performed at [facility], [address] [place], [place] [address]   CBC     Status: None   Collection Time: 07/22/23  1:00 AM  Result Value Ref Range   WBC 7.1 4.0 - 10.5 K/uL   RBC 4.29 4.22 - 5.81 MIL/uL   Hemoglobin 13.8 13.0 - 17.0 g/dL   HCT 19.1 47.8 - 29.5 %   MCV 93.5 80.0 - 100.0 fL   MCH 32.2 26.0 - 34.0 pg   MCHC 34.4 30.0 - 36.0 g/dL   RDW 62.1 30.8 - 65.7 %   Platelets 185 150 - 400 K/uL   nRBC 0.0 0.0 - 0.2 %    Comment: Performed at  [facility], [address] [place], [place] [address]  Creatinine, serum     Status: Abnormal   Collection Time: 07/22/23  1:00 AM  Result Value Ref Range   Creatinine, Ser 4.74 (H) 0.61 - 1.24 mg/dL   GFR, Estimated 12 (L) >60 mL/min    Comment: (NOTE) Calculated using the CKD-EPI Creatinine Equation (2021) Performed at [facility], [address] [place], [place] [address]   Lactic acid, plasma     Status: Abnormal   Collection Time: 07/22/23  1:00 AM  Result Value Ref Range   Lactic Acid, Venous 2.2 (HH) 0.5 - 1.9 mmol/L    Comment: CRITICAL RESULT CALLED TO, READ BACK BY AND VERIFIED WITH O.OLOFIN RN [id number] 07/22/2023 BY G.GANADEN Performed at [facility], [address] [place], [place] [address]   Glucose, capillary     Status: Abnormal   Collection Time: 07/22/23  1:13 AM  Result Value Ref Range   Glucose-Capillary 176 (H) 70 - 99 mg/dL    Comment: Glucose reference range applies only to samples taken after fasting for at least 8 hours.    Imaging: I have personally reviewed and interpreted all imaging for the past 24h and agree with the radiologist's impression.  DG Chest Port 1 View  Result Date: 07/21/2023 CLINICAL DATA:  Nasogastric tube placement. EXAM: PORTABLE CHEST 1 VIEW COMPARISON:  July 21, 2023 (6:37 p.m.) FINDINGS: Since the prior study there is been interval placement of a nasogastric tube. Its distal end extends into the body of the stomach. The distal side hole is approximately 3.2 cm distal to the expected region of the gastroesophageal junction. Multiple sternal wires and vascular clips are present. The cardiac silhouette is mildly enlarged and unchanged in size. There is moderate to marked severity calcification of the aortic arch. Mild linear atelectasis is noted within the left lung base. No pleural effusion or pneumothorax is identified. Multilevel degenerative changes are seen throughout the thoracic spine. IMPRESSION: 1. Interval  nasogastric tube placement and positioning, as described above. 2. Mild left basilar linear atelectasis. Electronically Signed   By: Aram Candela M.D.   On: 07/21/2023 22:19   CT ABDOMEN PELVIS WO CONTRAST  Result Date: 07/21/2023 CLINICAL DATA:  Left lower quadrant pain and  bloating, with chest pressure, ongoing for 3 weeks but worsening today. EXAM: CT ABDOMEN AND PELVIS WITHOUT CONTRAST TECHNIQUE: Multidetector CT imaging of the abdomen and pelvis was performed following the standard protocol without IV contrast. RADIATION DOSE REDUCTION: This exam was performed according to the departmental dose-optimization program which includes automated exposure control, adjustment of the mA and/or kV according to patient size and/or use of iterative reconstruction technique. COMPARISON:  CTs without contrast 04/10/2022 and 01/07/2021. FINDINGS: Lower chest: There are posterior atelectatic changes in the lower lobes without focal infiltrates in the lung bases. There is a small calcified pleural plaque along the dome of the right hemidiaphragm. The heart is moderately enlarged. There are sternotomy sutures are again noted and a prosthetic mitral valve. The coronary arteries are heavily calcified. No pericardial effusion. Hepatobiliary: The liver is unremarkable without contrast. The liver is 18 cm in length. There are multiple tiny stones in the posterior gallbladder, mild dilatation of the gallbladder, but no wall thickening or biliary dilatation. Pancreas: Moderately atrophic. No masses seen without contrast. There are occasional punctate parenchymal calcifications consistent with chronic calcific pancreatitis. There is no evidence of acute pancreatitis. Spleen: Slightly enlarged, measures 13.1 cm length. No focal abnormality without contrast. There is patchy calcification splenic artery. Adrenals/Urinary Tract: There are no adrenal mass. No contour deforming abnormality in the unenhanced kidneys is seen.  Bilateral perinephric stranding appears unchanged. There is no nephrolithiasis. There is mild bilateral cortical thinning. Previously there was an 8 mm mid right ureteral stone which has cleared in the interval. No ureteral stones or hydronephrosis are seen. There is no bladder thickening. Stomach/Bowel: The stomach moderately dilated with air and fluid Gastric lap band positioning is physiologic with upper limit of normal phi angle of 54 degrees. No acute complication is seen related to the injection tubing and right mid abdominal subcutaneous port. The small bowel is dilated up to 3.6 cm beginning just distal to the ligament of Treitz and continuing to a sharply angulated central abdominal transitional small bowel segment right of midline on 301: 43-46 and coronal reconstruction images 108-110. Etiology is most likely adhesive disease. Elsewhere, in the right lower abdomen there is a clockwise swirling of the mesenteric vessels, specifically on images 301: 48-57. This is consistent with an internal hernia but no bowel is seen entrapped within the swirling. No bowel pneumatosis or portal venous gas is evident. The appendix is normal. There is diffuse colonic diverticulosis heaviest in the descending and sigmoid segments, without findings of acute colitis or diverticulitis. Vascular/Lymphatic: Aortic atherosclerosis. No enlarged abdominal or pelvic lymph nodes. Reproductive: There is mild prostatomegaly. Other: There are mesenteric congestive changes in the right lower quadrant in the area of vascular swirling, but this is not seen elsewhere. There are bilateral small inguinal fat hernias. No incarcerated abdominal wall hernia. There is no free fluid, free hemorrhage or free air. Musculoskeletal: There is extensive bridging enthesopathy of the visualized thoracic spine. Osteopenia and advanced degenerative change lumbar spine. Mild lumbar dextroscoliosis. No acute or other significant osseous findings IMPRESSION: 1.  Intermediate-grade small-bowel obstruction with transition point in the distal ileum in the central abdomen right of midline, most likely due to adhesive disease. 2. There is also a clockwise swirling of the mesenteric vessels in the right lower abdomen separate from this consistent with an internal hernia, but no bowel is seen entrapped within the swirling. There are mesenteric congestive changes in the area of vascular swirling, not seen elsewhere. No bowel pneumatosis or portal venous gas  at this time. Surgical consult recommended. 3. Cholelithiasis with mild dilatation of the gallbladder but no wall thickening or biliary dilatation. 4. Gastric lap band positioning is physiologic with upper limit of normal phi angle of 54 degrees. 5. Cardiomegaly, aortic and coronary artery atherosclerosis. 6. Diverticulosis without evidence of diverticulitis. 7. Mild prostatomegaly. 8. Osteopenia and degenerative change. 9. Small calcified pleural plaque along the dome of the right hemidiaphragm. Aortic Atherosclerosis (ICD10-I70.0). Electronically Signed   By: Almira Bar M.D.   On: 07/21/2023 20:34   DG Chest Port 1 View  Result Date: 07/21/2023 CLINICAL DATA:  Upper chest pain and back pain. EXAM: PORTABLE CHEST 1 VIEW COMPARISON:  February 26, 2021 FINDINGS: Multiple sternal wires and vascular clips are seen. The cardiac silhouette is mildly enlarged and unchanged in size. There is marked severity calcification of the aortic arch. Mild prominence of the perihilar pulmonary vasculature is seen. Low lung volumes are noted with mild atelectasis present within the left lung base. No pleural effusion or pneumothorax is identified. Multilevel degenerative changes are seen throughout the thoracic spine. IMPRESSION: 1. Stable cardiomegaly with evidence of prior median sternotomy/CABG. 2. Low lung volumes with mild left basilar atelectasis. Electronically Signed   By: Aram Candela M.D.   On: 07/21/2023 19:25      Physical  Exam Blood pressure 135/86, pulse 85, temperature 98.2 F (36.8 C), temperature source Oral, resp. rate 20, height 5\' 10"  (1.778 m), weight 85.3 kg, SpO2 96%. Gen: Well nourished, NAD CV: Regular rate, Afib Pulm: Normal work of breathing on room air Abdomen: Soft, some discomfort to palpation on right. Lap band port palpable under skin Extremities: Warm and well perfused Psych: Appropriate Neuro: A&Ox4, no FND    Assessment: [name] is an 77 y.o. male who presents to [facility] as a transfer from [facility] ED for SBO.   Plan: - Small bowel obstruction protocol - FEN - strict NPO - DVT - SCDs,  Last received therapeutic Eliquis last night. Hold AC for now - Dispo - med-surg  - Despite mesenteric swirling, bowel appears unaffected, would not pursue any surgical intervention at this time  I spent a total of 62 minutes in both face-to-face and non-face-to-face activities, excluding procedures performed, for this visit on the date of this encounter.   Donata Duff, MD [facility] Surgery

## 2023-07-22 NOTE — Progress Notes (Signed)
Pt reports excessive itching since starting the dilaudid. He wants another pain medication. He reports he takes Hydrocodone or Oxycodone without problems. He also wants Volteren gel that he chronically uses for joint pain.   Provider on call was paged and she ordered IV benadryl and Voltaren gel. She advised that he is NPO so cannot order PO pain meds. Benadryl was administered and pt reports he would hold off on any pain medication at the time as he was sleepy.   Morphine IV was initially ordered and discontinued prior to any administration. Provider advised that pt not an ideal candidate for morphine given CKD with elevated creatine  and so pain management order was changed from Morphine to Fentanyl.  Patient currently resting. Will continue to monitor.

## 2023-07-23 ENCOUNTER — Inpatient Hospital Stay (HOSPITAL_COMMUNITY): Payer: No Typology Code available for payment source

## 2023-07-23 DIAGNOSIS — K56609 Unspecified intestinal obstruction, unspecified as to partial versus complete obstruction: Secondary | ICD-10-CM | POA: Diagnosis not present

## 2023-07-23 LAB — BASIC METABOLIC PANEL
Anion gap: 14 (ref 5–15)
BUN: 56 mg/dL — ABNORMAL HIGH (ref 8–23)
CO2: 22 mmol/L (ref 22–32)
Calcium: 9.7 mg/dL (ref 8.9–10.3)
Chloride: 106 mmol/L (ref 98–111)
Creatinine, Ser: 4.45 mg/dL — ABNORMAL HIGH (ref 0.61–1.24)
GFR, Estimated: 13 mL/min — ABNORMAL LOW (ref >60)
Glucose, Bld: 152 mg/dL — ABNORMAL HIGH (ref 70–99)
Potassium: 3.2 mmol/L — ABNORMAL LOW (ref 3.5–5.1)
Sodium: 142 mmol/L (ref 135–145)

## 2023-07-23 LAB — GLUCOSE, CAPILLARY
Glucose-Capillary: 157 mg/dL — ABNORMAL HIGH (ref 70–99)
Glucose-Capillary: 177 mg/dL — ABNORMAL HIGH (ref 70–99)
Glucose-Capillary: 192 mg/dL — ABNORMAL HIGH (ref 70–99)
Glucose-Capillary: 161 mg/dL — ABNORMAL HIGH (ref 70–99)

## 2023-07-23 LAB — MRSA NEXT GEN BY PCR, NASAL: MRSA by PCR Next Gen: NOT DETECTED

## 2023-07-23 MED ORDER — POTASSIUM CHLORIDE 10 MEQ/100ML IV SOLN
10.0000 meq | INTRAVENOUS | Status: AC
  Administered 2023-07-23 (×2): 10 meq via INTRAVENOUS
  Filled 2023-07-23 (×2): qty 100

## 2023-07-23 MED ORDER — ALUM & MAG HYDROXIDE-SIMETH 200-200-20 MG/5ML PO SUSP
15.0000 mL | ORAL | Status: DC | PRN
  Administered 2023-07-23: 15 mL via ORAL
  Filled 2023-07-23: qty 30

## 2023-07-23 MED ORDER — PANTOPRAZOLE SODIUM 40 MG PO TBEC
40.0000 mg | DELAYED_RELEASE_TABLET | Freq: Every day | ORAL | Status: DC
  Administered 2023-07-23 – 2023-07-24 (×2): 40 mg via ORAL
  Filled 2023-07-23 (×2): qty 1

## 2023-07-23 MED ORDER — POTASSIUM CHLORIDE 10 MEQ/100ML IV SOLN
10.0000 meq | INTRAVENOUS | Status: AC
  Administered 2023-07-23 (×3): 10 meq via INTRAVENOUS
  Filled 2023-07-23 (×3): qty 100

## 2023-07-23 NOTE — Progress Notes (Signed)
Subjective/Chief Complaint: Patient with flatus.  No nausea vomiting.  NG tube with 4 cc overnight.  Still has some mild to moderate right lower quadrant pain but no worse.  Small bowel protocol shows contrast in the colon   Objective: Vital signs in last 24 hours: Temp:  [97.3 F (36.3 C)-98.2 F (36.8 C)] 97.4 F (36.3 C) (11/29 0826) Pulse Rate:  [73-85] 79 (11/29 0826) Resp:  [16-18] 18 (11/29 0826) BP: (121-151)/(65-89) 130/65 (11/29 0826) SpO2:  [95 %-99 %] 99 % (11/29 0826)    Intake/Output from previous day: 11/28 0701 - 11/29 0700 In: 1159.9 [I.V.:1039.9; NG/GT:120] Out: 1900 [Urine:1450; Emesis/NG output:450] Intake/Output this shift: No intake/output data recorded.  Abdomen: Mild distention.  Tenderness noted right lower quadrant without rebound or guarding.  Lab Results:  Recent Labs    07/22/23 0100 07/22/23 0558  WBC 7.1 7.2  HGB 13.8 12.9*  HCT 40.1 37.4*  PLT 185 175   BMET Recent Labs    07/22/23 0558 07/23/23 0512  NA 136 142  K 3.3* 3.2*  CL 102 106  CO2 25 22  GLUCOSE 146* 152*  BUN 60* 56*  CREATININE 4.62* 4.45*  CALCIUM 9.4 9.7   PT/INR No results for input(s): "LABPROT", "INR" in the last 72 hours. ABG No results for input(s): "PHART", "HCO3" in the last 72 hours.  Invalid input(s): "PCO2", "PO2"  Studies/Results: DG Abd Portable 1V-Small Bowel Obstruction Protocol-initial, 8 hr delay  Result Date: 07/23/2023 CLINICAL DATA:  Small-bowel obstruction EXAM: PORTABLE ABDOMEN - 1 VIEW COMPARISON:  07/22/2023 FINDINGS: Gastric lap band device in expected position. Nasogastric tube tip overlies the expected gastric fundus. Administered oral contrast opacifies the colon and rectal vault which are not distended. Nonobstructive bowel gas pattern. No gross free intraperitoneal gas. Osseous structures are age-appropriate. IMPRESSION: 1. Nonobstructive bowel gas pattern. Electronically Signed   By: Helyn Numbers M.D.   On: 07/23/2023 03:09    DG Abd Portable 1V-Small Bowel Protocol-Position Verification  Result Date: 07/22/2023 CLINICAL DATA:  Nasogastric tube placement, small-bowel obstruction EXAM: PORTABLE ABDOMEN - 1 VIEW COMPARISON:  06/03/2023 and CT abdomen 07/21/2023 FINDINGS: Nasogastric tube noted with tip in the stomach at the top margin of the image. Tubing related to a lap band noted. Dilute contrast medium in the right sided loops of bowel. Substantial lumbar spondylosis. Spurring of the right femoral head. IMPRESSION: 1. Nasogastric tube tip is in the stomach at the top margin of the image. 2. Dilute contrast medium in the right sided loops of bowel. 3. Substantial lumbar spondylosis. 4. Spurring of the right femoral head. Electronically Signed   By: Gaylyn Rong M.D.   On: 07/22/2023 14:17   DG Chest Port 1 View  Result Date: 07/21/2023 CLINICAL DATA:  Nasogastric tube placement. EXAM: PORTABLE CHEST 1 VIEW COMPARISON:  July 21, 2023 (6:37 p.m.) FINDINGS: Since the prior study there is been interval placement of a nasogastric tube. Its distal end extends into the body of the stomach. The distal side hole is approximately 3.2 cm distal to the expected region of the gastroesophageal junction. Multiple sternal wires and vascular clips are present. The cardiac silhouette is mildly enlarged and unchanged in size. There is moderate to marked severity calcification of the aortic arch. Mild linear atelectasis is noted within the left lung base. No pleural effusion or pneumothorax is identified. Multilevel degenerative changes are seen throughout the thoracic spine. IMPRESSION: 1. Interval nasogastric tube placement and positioning, as described above. 2. Mild left basilar linear atelectasis.  Electronically Signed   By: Aram Candela M.D.   On: 07/21/2023 22:19   CT ABDOMEN PELVIS WO CONTRAST  Result Date: 07/21/2023 CLINICAL DATA:  Left lower quadrant pain and bloating, with chest pressure, ongoing for 3 weeks but  worsening today. EXAM: CT ABDOMEN AND PELVIS WITHOUT CONTRAST TECHNIQUE: Multidetector CT imaging of the abdomen and pelvis was performed following the standard protocol without IV contrast. RADIATION DOSE REDUCTION: This exam was performed according to the departmental dose-optimization program which includes automated exposure control, adjustment of the mA and/or kV according to patient size and/or use of iterative reconstruction technique. COMPARISON:  CTs without contrast 04/10/2022 and 01/07/2021. FINDINGS: Lower chest: There are posterior atelectatic changes in the lower lobes without focal infiltrates in the lung bases. There is a small calcified pleural plaque along the dome of the right hemidiaphragm. The heart is moderately enlarged. There are sternotomy sutures are again noted and a prosthetic mitral valve. The coronary arteries are heavily calcified. No pericardial effusion. Hepatobiliary: The liver is unremarkable without contrast. The liver is 18 cm in length. There are multiple tiny stones in the posterior gallbladder, mild dilatation of the gallbladder, but no wall thickening or biliary dilatation. Pancreas: Moderately atrophic. No masses seen without contrast. There are occasional punctate parenchymal calcifications consistent with chronic calcific pancreatitis. There is no evidence of acute pancreatitis. Spleen: Slightly enlarged, measures 13.1 cm length. No focal abnormality without contrast. There is patchy calcification splenic artery. Adrenals/Urinary Tract: There are no adrenal mass. No contour deforming abnormality in the unenhanced kidneys is seen. Bilateral perinephric stranding appears unchanged. There is no nephrolithiasis. There is mild bilateral cortical thinning. Previously there was an 8 mm mid right ureteral stone which has cleared in the interval. No ureteral stones or hydronephrosis are seen. There is no bladder thickening. Stomach/Bowel: The stomach moderately dilated with air and  fluid Gastric lap band positioning is physiologic with upper limit of normal phi angle of 54 degrees. No acute complication is seen related to the injection tubing and right mid abdominal subcutaneous port. The small bowel is dilated up to 3.6 cm beginning just distal to the ligament of Treitz and continuing to a sharply angulated central abdominal transitional small bowel segment right of midline on 301: 43-46 and coronal reconstruction images 108-110. Etiology is most likely adhesive disease. Elsewhere, in the right lower abdomen there is a clockwise swirling of the mesenteric vessels, specifically on images 301: 48-57. This is consistent with an internal hernia but no bowel is seen entrapped within the swirling. No bowel pneumatosis or portal venous gas is evident. The appendix is normal. There is diffuse colonic diverticulosis heaviest in the descending and sigmoid segments, without findings of acute colitis or diverticulitis. Vascular/Lymphatic: Aortic atherosclerosis. No enlarged abdominal or pelvic lymph nodes. Reproductive: There is mild prostatomegaly. Other: There are mesenteric congestive changes in the right lower quadrant in the area of vascular swirling, but this is not seen elsewhere. There are bilateral small inguinal fat hernias. No incarcerated abdominal wall hernia. There is no free fluid, free hemorrhage or free air. Musculoskeletal: There is extensive bridging enthesopathy of the visualized thoracic spine. Osteopenia and advanced degenerative change lumbar spine. Mild lumbar dextroscoliosis. No acute or other significant osseous findings IMPRESSION: 1. Intermediate-grade small-bowel obstruction with transition point in the distal ileum in the central abdomen right of midline, most likely due to adhesive disease. 2. There is also a clockwise swirling of the mesenteric vessels in the right lower abdomen separate from this consistent with  an internal hernia, but no bowel is seen entrapped within  the swirling. There are mesenteric congestive changes in the area of vascular swirling, not seen elsewhere. No bowel pneumatosis or portal venous gas at this time. Surgical consult recommended. 3. Cholelithiasis with mild dilatation of the gallbladder but no wall thickening or biliary dilatation. 4. Gastric lap band positioning is physiologic with upper limit of normal phi angle of 54 degrees. 5. Cardiomegaly, aortic and coronary artery atherosclerosis. 6. Diverticulosis without evidence of diverticulitis. 7. Mild prostatomegaly. 8. Osteopenia and degenerative change. 9. Small calcified pleural plaque along the dome of the right hemidiaphragm. Aortic Atherosclerosis (ICD10-I70.0). Electronically Signed   By: Almira Bar M.D.   On: 07/21/2023 20:34   DG Chest Port 1 View  Result Date: 07/21/2023 CLINICAL DATA:  Upper chest pain and back pain. EXAM: PORTABLE CHEST 1 VIEW COMPARISON:  February 26, 2021 FINDINGS: Multiple sternal wires and vascular clips are seen. The cardiac silhouette is mildly enlarged and unchanged in size. There is marked severity calcification of the aortic arch. Mild prominence of the perihilar pulmonary vasculature is seen. Low lung volumes are noted with mild atelectasis present within the left lung base. No pleural effusion or pneumothorax is identified. Multilevel degenerative changes are seen throughout the thoracic spine. IMPRESSION: 1. Stable cardiomegaly with evidence of prior median sternotomy/CABG. 2. Low lung volumes with mild left basilar atelectasis. Electronically Signed   By: Aram Candela M.D.   On: 07/21/2023 19:25    Anti-infectives: Anti-infectives (From admission, onward)    None       Assessment/Plan: Small bowel obstruction-this appears to be partial.  Remove NG tube today given small bowel protocol findings and start clears.  If he tolerates diet has a bowel movement he may go home in the next 24 to 48 hours from a surgical standpoint.  Moderate  complexity   LOS: 2 days    Dortha Schwalbe MD 07/23/2023

## 2023-07-23 NOTE — Plan of Care (Signed)
NG tube removed per MD order at 11:00. Pt tolerated without complaint. Patient tolerating clear liquids well. Ambulating in hallways without issue, passing flatus and belching. PRN maloxx and protonix admin for c/o indgestion.  Patient states throat pain is better since tube removed.  Wife (retured OR Charity fundraiser) at [facility] most of today. Patient resting quietly and denies needs at this time.

## 2023-07-23 NOTE — TOC CM/SW Note (Addendum)
Transition of Care [facility]) - Inpatient Brief Assessment   Patient Details  Name: [name] MRN: [id number] Date of Birth: Sep 06, 1945  [facility] of [facility]) CM/SW Contact:    Tom-Johnson, Hershal Coria, RN Phone Number: 07/23/2023, 4:23 PM   Clinical Narrative:  Patient presented to the ED with worsening Abdominal Pain, ongoing for 3 wks. Found to have Small Bowel Obstruction. Has NG tube to Rt Nare. Sx and GI following.   From home with wife, has one supportive son. Retired, independent with care and drive self. Has a cane, walker, handicapped bathroom.  PCP is Daisy Floro, MD in [place] and Sondra Come, MD at the [facility]. [facility].  CM called and the 72hr hotline and Verification # is [phone].   No TOC needs or recommendations noted at this time.  Patient not Medically ready for discharge.  CM will continue to follow as patient progresses with care towards discharge.       Transition of Care Asessment: Insurance and Status: Insurance coverage has been reviewed Patient has primary care physician: Yes Home environment has been reviewed: Yes Prior level of function:: Modified Independent Prior/Current Home Services: No current home services Social Determinants of Health Reivew: SDOH reviewed no interventions necessary Readmission risk has been reviewed: Yes Transition of care needs: no transition of care needs at this time

## 2023-07-23 NOTE — Progress Notes (Signed)
[facility] PROGRESS NOTE  SHAKIR ISENHOUR  ZOX:[id number] DOB: 02-Apr-1946 DOA: 07/21/2023 PCP: Daisy Floro, MD   Brief Narrative: Patient is a 77 year old male with history of coronary artery  disease, chronic diastolic CHF, og gastric banding, paroxysmal A-fib on Eliquis, hypertension, hyperlipidemia, PTSD, type 2 diabetes who presented with 5-day history of progressive abdomen pain, nausea.  CT abdomen/pelvis showed small bowel obstruction.  General surgery consulted. Abdominal x-ray with SBO protocol shows contrast in colon.  Started on clear liquid diet  Assessment & Plan:  Principal Problem:   SBO (small bowel obstruction) (HCC) Active Problems:   Atrial fibrillation, chronic (HCC)   CAD, AUTOLOGOUS BYPASS GRAFT   Type 2 diabetes mellitus without complications (HCC)   Mixed hyperlipidemia   Essential hypertension   Obstructive sleep apnea of adult   Essential (primary) hypertension   CKD (chronic kidney disease), stage IV (HCC)  SBO: Presented with abdominal pain, nausea.  Started on conservative management.  NG tube, IV fluid, antiemetics, analgesics.  General surgery following.Abdominal x-ray with SBO protocol shows contrast in colon.  Started on clear liquid diet.  Plan to take out NG tube today, gradually advance to diet.  CKD stage V: Baseline creatinine in the range of 5.  Currently kidney function at baseline.Follows with Dr. Glenna Fellows, [facility].  Has AV fistula on the left forearm  Diabetes 2: Currently on sliding scale.  Monitor blood sugars  Hypertension: Antihypertensives on hold.  Coronary artery disease: No anginal symptoms  Paroxysmal A-fib.  Currently in normal sinus rhythm.  Anticoagulation on hold.  Rate is controlled  Chronic diastolic CHF: On gentle IV fluids  OSA: On CPAP  Hypokalemia: Supplemented, will be monitored  Hyperlipidemia: On statin         DVT prophylaxis:enoxaparin (LOVENOX) injection 30 mg Start: 07/22/23 1000     Code  Status: Full Code  Family Communication: None at the bedside  Patient status: Inpatient  Patient is from : Home  Anticipated discharge to: Home  Estimated DC date: Tomorrow   Consultants: General Surgery  Procedures: None  Antimicrobials:  Anti-infectives (From admission, onward)    None       Subjective: Patient seen and examined the bedside today.  Hemodynamically stable.  Comfortable.  Denied any significant abdominal pain, nausea or vomiting today.  Has good bowel sounds.  Objective: Vitals:   07/22/23 1516 07/22/23 1956 07/23/23 0220 07/23/23 0826  BP: 126/77 121/66 (!) 151/89 130/65  Pulse: 73 74 85 79  Resp: 16 17 18 18   Temp: 98 F (36.7 C) (!) 97.3 F (36.3 C) 98.2 F (36.8 C) (!) 97.4 F (36.3 C)  TempSrc: Oral     SpO2: 97% 95% 99% 99%  Weight:      Height:        Intake/Output Summary (Last 24 hours) at 07/23/2023 1104 Last data filed at 07/23/2023 8119 Gross per 24 hour  Intake 1129.91 ml  Output 1900 ml  Net -770.09 ml   Filed Weights   07/21/23 1812  Weight: 85.3 kg    Examination:  General exam: Overall comfortable, not in distress HEENT: PERRL, NG tube Respiratory system:  no wheezes or crackles  Cardiovascular system: S1 & S2 heard, RRR.  Gastrointestinal system: Abdomen is nondistended, soft and nontender. Central nervous system: Alert and oriented Extremities: No edema, no clubbing ,no cyanosis Skin: No rashes, no ulcers,no icterus     Data Reviewed: I have personally reviewed following labs and imaging studies  CBC: Recent Labs  Lab  07/21/23 1822 07/21/23 1859 07/22/23 0100 07/22/23 0558  WBC 10.6*  --  7.1 7.2  HGB 14.6 14.3 13.8 12.9*  HCT 43.3 42.0 40.1 37.4*  MCV 94.1  --  93.5 92.1  PLT 182  --  185 175   Basic Metabolic Panel: Recent Labs  Lab 07/21/23 1859 07/22/23 0100 07/22/23 0558 07/23/23 0512  NA 136  --  136 142  K 3.4*  --  3.3* 3.2*  CL 99  --  102 106  CO2  --   --  25 22  GLUCOSE 219*   --  146* 152*  BUN 56*  --  60* 56*  CREATININE 5.00* 4.74* 4.62* 4.45*  CALCIUM  --   --  9.4 9.7     No results found for this or any previous visit (from the past 240 hour(s)).   Radiology Studies: DG Abd Portable 1V-Small Bowel Obstruction Protocol-initial, 8 hr delay  Result Date: 07/23/2023 CLINICAL DATA:  Small-bowel obstruction EXAM: PORTABLE ABDOMEN - 1 VIEW COMPARISON:  07/22/2023 FINDINGS: Gastric lap band device in expected position. Nasogastric tube tip overlies the expected gastric fundus. Administered oral contrast opacifies the colon and rectal vault which are not distended. Nonobstructive bowel gas pattern. No gross free intraperitoneal gas. Osseous structures are age-appropriate. IMPRESSION: 1. Nonobstructive bowel gas pattern. Electronically Signed   By: Helyn Numbers M.D.   On: 07/23/2023 03:09   DG Abd Portable 1V-Small Bowel Protocol-Position Verification  Result Date: 07/22/2023 CLINICAL DATA:  Nasogastric tube placement, small-bowel obstruction EXAM: PORTABLE ABDOMEN - 1 VIEW COMPARISON:  06/03/2023 and CT abdomen 07/21/2023 FINDINGS: Nasogastric tube noted with tip in the stomach at the top margin of the image. Tubing related to a lap band noted. Dilute contrast medium in the right sided loops of bowel. Substantial lumbar spondylosis. Spurring of the right femoral head. IMPRESSION: 1. Nasogastric tube tip is in the stomach at the top margin of the image. 2. Dilute contrast medium in the right sided loops of bowel. 3. Substantial lumbar spondylosis. 4. Spurring of the right femoral head. Electronically Signed   By: Gaylyn Rong M.D.   On: 07/22/2023 14:17   DG Chest Port 1 View  Result Date: 07/21/2023 CLINICAL DATA:  Nasogastric tube placement. EXAM: PORTABLE CHEST 1 VIEW COMPARISON:  July 21, 2023 (6:37 p.m.) FINDINGS: Since the prior study there is been interval placement of a nasogastric tube. Its distal end extends into the body of the stomach. The  distal side hole is approximately 3.2 cm distal to the expected region of the gastroesophageal junction. Multiple sternal wires and vascular clips are present. The cardiac silhouette is mildly enlarged and unchanged in size. There is moderate to marked severity calcification of the aortic arch. Mild linear atelectasis is noted within the left lung base. No pleural effusion or pneumothorax is identified. Multilevel degenerative changes are seen throughout the thoracic spine. IMPRESSION: 1. Interval nasogastric tube placement and positioning, as described above. 2. Mild left basilar linear atelectasis. Electronically Signed   By: Aram Candela M.D.   On: 07/21/2023 22:19   CT ABDOMEN PELVIS WO CONTRAST  Result Date: 07/21/2023 CLINICAL DATA:  Left lower quadrant pain and bloating, with chest pressure, ongoing for 3 weeks but worsening today. EXAM: CT ABDOMEN AND PELVIS WITHOUT CONTRAST TECHNIQUE: Multidetector CT imaging of the abdomen and pelvis was performed following the standard protocol without IV contrast. RADIATION DOSE REDUCTION: This exam was performed according to the departmental dose-optimization program which includes automated exposure control, adjustment of  the mA and/or kV according to patient size and/or use of iterative reconstruction technique. COMPARISON:  CTs without contrast 04/10/2022 and 01/07/2021. FINDINGS: Lower chest: There are posterior atelectatic changes in the lower lobes without focal infiltrates in the lung bases. There is a small calcified pleural plaque along the dome of the right hemidiaphragm. The heart is moderately enlarged. There are sternotomy sutures are again noted and a prosthetic mitral valve. The coronary arteries are heavily calcified. No pericardial effusion. Hepatobiliary: The liver is unremarkable without contrast. The liver is 18 cm in length. There are multiple tiny stones in the posterior gallbladder, mild dilatation of the gallbladder, but no wall  thickening or biliary dilatation. Pancreas: Moderately atrophic. No masses seen without contrast. There are occasional punctate parenchymal calcifications consistent with chronic calcific pancreatitis. There is no evidence of acute pancreatitis. Spleen: Slightly enlarged, measures 13.1 cm length. No focal abnormality without contrast. There is patchy calcification splenic artery. Adrenals/Urinary Tract: There are no adrenal mass. No contour deforming abnormality in the unenhanced kidneys is seen. Bilateral perinephric stranding appears unchanged. There is no nephrolithiasis. There is mild bilateral cortical thinning. Previously there was an 8 mm mid right ureteral stone which has cleared in the interval. No ureteral stones or hydronephrosis are seen. There is no bladder thickening. Stomach/Bowel: The stomach moderately dilated with air and fluid Gastric lap band positioning is physiologic with upper limit of normal phi angle of 54 degrees. No acute complication is seen related to the injection tubing and right mid abdominal subcutaneous port. The small bowel is dilated up to 3.6 cm beginning just distal to the ligament of Treitz and continuing to a sharply angulated central abdominal transitional small bowel segment right of midline on 301: 43-46 and coronal reconstruction images 108-110. Etiology is most likely adhesive disease. Elsewhere, in the right lower abdomen there is a clockwise swirling of the mesenteric vessels, specifically on images 301: 48-57. This is consistent with an internal hernia but no bowel is seen entrapped within the swirling. No bowel pneumatosis or portal venous gas is evident. The appendix is normal. There is diffuse colonic diverticulosis heaviest in the descending and sigmoid segments, without findings of acute colitis or diverticulitis. Vascular/Lymphatic: Aortic atherosclerosis. No enlarged abdominal or pelvic lymph nodes. Reproductive: There is mild prostatomegaly. Other: There are  mesenteric congestive changes in the right lower quadrant in the area of vascular swirling, but this is not seen elsewhere. There are bilateral small inguinal fat hernias. No incarcerated abdominal wall hernia. There is no free fluid, free hemorrhage or free air. Musculoskeletal: There is extensive bridging enthesopathy of the visualized thoracic spine. Osteopenia and advanced degenerative change lumbar spine. Mild lumbar dextroscoliosis. No acute or other significant osseous findings IMPRESSION: 1. Intermediate-grade small-bowel obstruction with transition point in the distal ileum in the central abdomen right of midline, most likely due to adhesive disease. 2. There is also a clockwise swirling of the mesenteric vessels in the right lower abdomen separate from this consistent with an internal hernia, but no bowel is seen entrapped within the swirling. There are mesenteric congestive changes in the area of vascular swirling, not seen elsewhere. No bowel pneumatosis or portal venous gas at this time. Surgical consult recommended. 3. Cholelithiasis with mild dilatation of the gallbladder but no wall thickening or biliary dilatation. 4. Gastric lap band positioning is physiologic with upper limit of normal phi angle of 54 degrees. 5. Cardiomegaly, aortic and coronary artery atherosclerosis. 6. Diverticulosis without evidence of diverticulitis. 7. Mild prostatomegaly. 8. Osteopenia  and degenerative change. 9. Small calcified pleural plaque along the dome of the right hemidiaphragm. Aortic Atherosclerosis (ICD10-I70.0). Electronically Signed   By: Almira Bar M.D.   On: 07/21/2023 20:34   DG Chest Port 1 View  Result Date: 07/21/2023 CLINICAL DATA:  Upper chest pain and back pain. EXAM: PORTABLE CHEST 1 VIEW COMPARISON:  February 26, 2021 FINDINGS: Multiple sternal wires and vascular clips are seen. The cardiac silhouette is mildly enlarged and unchanged in size. There is marked severity calcification of the aortic  arch. Mild prominence of the perihilar pulmonary vasculature is seen. Low lung volumes are noted with mild atelectasis present within the left lung base. No pleural effusion or pneumothorax is identified. Multilevel degenerative changes are seen throughout the thoracic spine. IMPRESSION: 1. Stable cardiomegaly with evidence of prior median sternotomy/CABG. 2. Low lung volumes with mild left basilar atelectasis. Electronically Signed   By: Aram Candela M.D.   On: 07/21/2023 19:25    Scheduled Meds:  enoxaparin (LOVENOX) injection  30 mg Subcutaneous Daily   insulin aspart  0-5 Units Subcutaneous QHS   insulin aspart  0-9 Units Subcutaneous TID WC   Continuous Infusions:  sodium chloride 50 mL/hr at 07/23/23 0346   potassium chloride 10 mEq (07/23/23 1049)     LOS: 2 days   Burnadette Pop, MD [facility] P11/29/2024, 11:04 AM

## 2023-07-24 DIAGNOSIS — K56609 Unspecified intestinal obstruction, unspecified as to partial versus complete obstruction: Secondary | ICD-10-CM | POA: Diagnosis not present

## 2023-07-24 LAB — BASIC METABOLIC PANEL
Anion gap: 8 (ref 5–15)
BUN: 53 mg/dL — ABNORMAL HIGH (ref 8–23)
CO2: 25 mmol/L (ref 22–32)
Calcium: 9.5 mg/dL (ref 8.9–10.3)
Chloride: 103 mmol/L (ref 98–111)
Creatinine, Ser: 4.06 mg/dL — ABNORMAL HIGH (ref 0.61–1.24)
GFR, Estimated: 14 mL/min — ABNORMAL LOW (ref >60)
Glucose, Bld: 135 mg/dL — ABNORMAL HIGH (ref 70–99)
Potassium: 3.2 mmol/L — ABNORMAL LOW (ref 3.5–5.1)
Sodium: 136 mmol/L (ref 135–145)

## 2023-07-24 LAB — GLUCOSE, CAPILLARY: Glucose-Capillary: 159 mg/dL — ABNORMAL HIGH (ref 70–99)

## 2023-07-24 MED ORDER — POTASSIUM CHLORIDE CRYS ER 20 MEQ PO TBCR
40.0000 meq | EXTENDED_RELEASE_TABLET | Freq: Once | ORAL | Status: AC
  Administered 2023-07-24: 40 meq via ORAL
  Filled 2023-07-24: qty 2

## 2023-07-24 NOTE — Discharge Summary (Signed)
Physician Discharge Summary  [name] ZOX:[id number] DOB: [birth year] DOA: 07/21/2023  PCP: Daisy Floro, MD  Admit date: 07/21/2023 Discharge date: 07/24/2023  Admitted From: Home Disposition:  Home  Discharge Condition:Stable CODE STATUS:FULL Diet recommendation: Heart Healthy   Brief/Interim Summary: Patient is a 77 year old male with history of coronary artery  disease, chronic diastolic CHF, og gastric banding, paroxysmal A-fib on Eliquis, hypertension, hyperlipidemia, PTSD, type 2 diabetes who presented with 5-day history of progressive abdomen pain, nausea.  CT abdomen/pelvis showed small bowel obstruction.  General surgery consulted. Abdominal x-ray with SBO protocol shows contrast in colon.  Diet advanced to soft, which he tolerated.  Medically stable for discharge home today  Following problems were addressed during the hospitalization:  SBO: Presented with abdominal pain, nausea.  Started on conservative management.  NG tube, IV fluid, antiemetics, analgesics.  General surgery following.Abdominal x-ray with SBO protocol shows contrast in colon.  NG tube taken out.  Diet advanced to soft, which he tolerated.   CKD stage V: Baseline creatinine in the range of 5.  Currently kidney function at baseline.Follows with Dr. Glenna Fellows, [place] [facility].  Has AV fistula on the left forearm.  On high-dose Lasix at home   Diabetes 2: Home regimen to be continued   Hypertension: Home regimen  to be continued   Coronary artery disease: No anginal symptoms   Paroxysmal A-fib.  Currently in normal sinus rhythm.  Anticoagulation on hold.  Rate is controlled   Chronic diastolic CHF: Continue home Lasix   OSA: On CPAP   Hypokalemia: Supplemented and corrected   Hyperlipidemia: On statin   Discharge Diagnoses:  Principal Problem:   SBO (small bowel obstruction) (HCC) Active Problems:   Atrial fibrillation, chronic (HCC)   CAD, AUTOLOGOUS BYPASS GRAFT   Type 2 diabetes  mellitus without complications (HCC)   Mixed hyperlipidemia   Essential hypertension   Obstructive sleep apnea of adult   Essential (primary) hypertension   CKD (chronic kidney disease), stage IV [facility])    Discharge Instructions  Discharge Instructions     Diet general   Complete by: As directed    Soft diet for next 1-2 days   Discharge instructions   Complete by: As directed    1)Follow up with your PCP in a week   Increase activity slowly   Complete by: As directed       Allergies as of 07/24/2023       Reactions   Dilaudid [hydromorphone Hcl] Itching   Crestor [rosuvastatin] Other (See Comments)   Muscle aches   Lipitor [atorvastatin] Other (See Comments)   Muscle aches   Pravastatin Other (See Comments)   Muscle aches   Carvedilol Other (See Comments)   loss of appetite   Metformin Diarrhea   Serotonin Reuptake Inhibitors (ssris) Other (See Comments)   Muscle aches   Zocor [simvastatin] Other (See Comments)    Muscle pain   Codeine Itching, Other (See Comments)   Extremity tingling-- can take synthetic        Medication List     TAKE these medications    albuterol 108 (90 Base) MCG/ACT inhaler Commonly known as: VENTOLIN HFA Inhale 1-2 puffs into the lungs every 6 (six) hours as needed for shortness of breath or wheezing.   allopurinol 100 MG tablet Commonly known as: ZYLOPRIM Take 50 mg by mouth 2 (two) times a week.   apixaban 5 MG Tabs tablet Commonly known as: Eliquis Take 1 tablet (5 mg total) by mouth 2 (  two) times daily.   busPIRone 10 MG tablet Commonly known as: BUSPAR Take 5 mg by mouth 2 (two) times daily.   cetirizine 10 MG tablet Commonly known as: ZYRTEC Take 10 mg by mouth in the morning.   colchicine 0.6 MG tablet Take 0.6-1.2 mg by mouth 2 (two) times daily as needed (gout).   cycloSPORINE 0.05 % ophthalmic emulsion Commonly known as: RESTASIS Place 1 drop into both eyes 2 (two) times daily as needed (eye  irritation.).   diclofenac Sodium 1 % Gel Commonly known as: VOLTAREN Apply 2 g topically 3 (three) times daily as needed for pain.   docusate sodium 100 MG capsule Commonly known as: COLACE Take 200 mg by mouth at bedtime.   ezetimibe 10 MG tablet Commonly known as: ZETIA Take 1 tablet (10 mg total) by mouth daily.   ferrous sulfate 325 (65 FE) MG tablet Take 325 mg by mouth every other day.   fluticasone 50 MCG/ACT nasal spray Commonly known as: FLONASE Place 1 spray into both nostrils daily as needed for allergies or rhinitis.   furosemide 40 MG tablet Commonly known as: LASIX Take 3 tablets (120 mg total) by mouth 2 (two) times daily. Please cancel all previous orders for current medication. Change in dosage or pill size.   glipiZIDE 5 MG tablet Commonly known as: GLUCOTROL Take 0.5-1 tablets (2.5-5 mg total) by mouth daily before supper.   HYDROcodone-acetaminophen 10-325 MG tablet Commonly known as: NORCO Take 1 tablet by mouth 3 (three) times daily as needed for moderate pain.   Meclizine HCl 25 MG Chew Chew 25 mg by mouth 3 (three) times daily as needed (dizziness).   methocarbamol 500 MG tablet Commonly known as: ROBAXIN Take 500 mg by mouth 3 (three) times daily as needed for muscle spasms.   metoprolol succinate 50 MG 24 hr tablet Commonly known as: TOPROL-XL Take 0.5 tablets (25 mg total) by mouth daily. Take with or immediately following a meal.   multivitamin capsule Take 1 capsule by mouth in the morning.   nitroGLYCERIN 0.4 MG SL tablet Commonly known as: NITROSTAT 1 TAB UNDER THE TONGUE EVERY 5 MINUTES AS NEEDED FOR CHEST PAIN UP TO 3 DOSES, IF PERSIST CALL 911   OneTouch Verio test strip Generic drug: glucose blood 1 each by Other route in the morning and at bedtime.   Ozempic (1 MG/DOSE) 4 MG/3ML Sopn Generic drug: Semaglutide (1 MG/DOSE) Inject 1 mg into the skin once a week.   pantoprazole 40 MG tablet Commonly known as: PROTONIX Take  40 mg by mouth daily as needed (heartburn).   potassium chloride SA 20 MEQ tablet Commonly known as: KLOR-CON M Take 20 mEq by mouth daily.   Praluent 75 MG/ML Soaj Generic drug: Alirocumab Inject 75 mg into the skin every 14 (fourteen) days.   tadalafil 20 MG tablet Commonly known as: CIALIS Take 20 mg by mouth daily as needed for erectile dysfunction.   testosterone cypionate 200 MG/ML injection Commonly known as: DEPOTESTOSTERONE CYPIONATE Inject 200 mg into the muscle once a week.   traZODone 100 MG tablet Commonly known as: DESYREL Take 100 mg by mouth at bedtime.   Vitamin B-12 5000 MCG Tbdp Take 5,000 mcg by mouth 2 (two) times a week.        Follow-up Information     Daisy Floro, MD. Schedule an appointment as soon as possible for a visit in 1 week(s).   Specialty: Family Medicine Contact information: [address]  [place] [address] [phone]                Allergies  Allergen Reactions   Dilaudid [Hydromorphone Hcl] Itching   Crestor [Rosuvastatin] Other (See Comments)    Muscle aches    Lipitor [Atorvastatin] Other (See Comments)    Muscle aches   Pravastatin Other (See Comments)    Muscle aches   Carvedilol Other (See Comments)    loss of appetite   Metformin Diarrhea   Serotonin Reuptake Inhibitors (Ssris) Other (See Comments)    Muscle aches   Zocor [Simvastatin] Other (See Comments)     Muscle pain   Codeine Itching and Other (See Comments)    Extremity tingling-- can take synthetic    Consultations: Surgery   Procedures/Studies: DG Abd Portable 1V-Small Bowel Obstruction Protocol-initial, 8 hr delay  Result Date: 07/23/2023 CLINICAL DATA:  Small-bowel obstruction EXAM: PORTABLE ABDOMEN - 1 VIEW COMPARISON:  07/22/2023 FINDINGS: Gastric lap band device in expected position. Nasogastric tube tip overlies the expected gastric fundus. Administered oral contrast opacifies the colon and rectal vault which are not  distended. Nonobstructive bowel gas pattern. No gross free intraperitoneal gas. Osseous structures are age-appropriate. IMPRESSION: 1. Nonobstructive bowel gas pattern. Electronically Signed   By: Helyn Numbers M.D.   On: 07/23/2023 03:09   DG Abd Portable 1V-Small Bowel Protocol-Position Verification  Result Date: 07/22/2023 CLINICAL DATA:  Nasogastric tube placement, small-bowel obstruction EXAM: PORTABLE ABDOMEN - 1 VIEW COMPARISON:  06/03/2023 and CT abdomen 07/21/2023 FINDINGS: Nasogastric tube noted with tip in the stomach at the top margin of the image. Tubing related to a lap band noted. Dilute contrast medium in the right sided loops of bowel. Substantial lumbar spondylosis. Spurring of the right femoral head. IMPRESSION: 1. Nasogastric tube tip is in the stomach at the top margin of the image. 2. Dilute contrast medium in the right sided loops of bowel. 3. Substantial lumbar spondylosis. 4. Spurring of the right femoral head. Electronically Signed   By: Gaylyn Rong M.D.   On: 07/22/2023 14:17   DG Chest Port 1 View  Result Date: 07/21/2023 CLINICAL DATA:  Nasogastric tube placement. EXAM: PORTABLE CHEST 1 VIEW COMPARISON:  July 21, 2023 (6:37 p.m.) FINDINGS: Since the prior study there is been interval placement of a nasogastric tube. Its distal end extends into the body of the stomach. The distal side hole is approximately 3.2 cm distal to the expected region of the gastroesophageal junction. Multiple sternal wires and vascular clips are present. The cardiac silhouette is mildly enlarged and unchanged in size. There is moderate to marked severity calcification of the aortic arch. Mild linear atelectasis is noted within the left lung base. No pleural effusion or pneumothorax is identified. Multilevel degenerative changes are seen throughout the thoracic spine. IMPRESSION: 1. Interval nasogastric tube placement and positioning, as described above. 2. Mild left basilar linear  atelectasis. Electronically Signed   By: Aram Candela M.D.   On: 07/21/2023 22:19   CT ABDOMEN PELVIS WO CONTRAST  Result Date: 07/21/2023 CLINICAL DATA:  Left lower quadrant pain and bloating, with chest pressure, ongoing for 3 weeks but worsening today. EXAM: CT ABDOMEN AND PELVIS WITHOUT CONTRAST TECHNIQUE: Multidetector CT imaging of the abdomen and pelvis was performed following the standard protocol without IV contrast. RADIATION DOSE REDUCTION: This exam was performed according to the departmental dose-optimization program which includes automated exposure control, adjustment of the mA and/or kV according to patient size and/or use of iterative reconstruction technique. COMPARISON:  CTs  without contrast 04/10/2022 and 01/07/2021. FINDINGS: Lower chest: There are posterior atelectatic changes in the lower lobes without focal infiltrates in the lung bases. There is a small calcified pleural plaque along the dome of the right hemidiaphragm. The heart is moderately enlarged. There are sternotomy sutures are again noted and a prosthetic mitral valve. The coronary arteries are heavily calcified. No pericardial effusion. Hepatobiliary: The liver is unremarkable without contrast. The liver is 18 cm in length. There are multiple tiny stones in the posterior gallbladder, mild dilatation of the gallbladder, but no wall thickening or biliary dilatation. Pancreas: Moderately atrophic. No masses seen without contrast. There are occasional punctate parenchymal calcifications consistent with chronic calcific pancreatitis. There is no evidence of acute pancreatitis. Spleen: Slightly enlarged, measures 13.1 cm length. No focal abnormality without contrast. There is patchy calcification splenic artery. Adrenals/Urinary Tract: There are no adrenal mass. No contour deforming abnormality in the unenhanced kidneys is seen. Bilateral perinephric stranding appears unchanged. There is no nephrolithiasis. There is mild  bilateral cortical thinning. Previously there was an 8 mm mid right ureteral stone which has cleared in the interval. No ureteral stones or hydronephrosis are seen. There is no bladder thickening. Stomach/Bowel: The stomach moderately dilated with air and fluid Gastric lap band positioning is physiologic with upper limit of normal phi angle of 54 degrees. No acute complication is seen related to the injection tubing and right mid abdominal subcutaneous port. The small bowel is dilated up to 3.6 cm beginning just distal to the ligament of Treitz and continuing to a sharply angulated central abdominal transitional small bowel segment right of midline on 301: 43-46 and coronal reconstruction images 108-110. Etiology is most likely adhesive disease. Elsewhere, in the right lower abdomen there is a clockwise swirling of the mesenteric vessels, specifically on images 301: 48-57. This is consistent with an internal hernia but no bowel is seen entrapped within the swirling. No bowel pneumatosis or portal venous gas is evident. The appendix is normal. There is diffuse colonic diverticulosis heaviest in the descending and sigmoid segments, without findings of acute colitis or diverticulitis. Vascular/Lymphatic: Aortic atherosclerosis. No enlarged abdominal or pelvic lymph nodes. Reproductive: There is mild prostatomegaly. Other: There are mesenteric congestive changes in the right lower quadrant in the area of vascular swirling, but this is not seen elsewhere. There are bilateral small inguinal fat hernias. No incarcerated abdominal wall hernia. There is no free fluid, free hemorrhage or free air. Musculoskeletal: There is extensive bridging enthesopathy of the visualized thoracic spine. Osteopenia and advanced degenerative change lumbar spine. Mild lumbar dextroscoliosis. No acute or other significant osseous findings IMPRESSION: 1. Intermediate-grade small-bowel obstruction with transition point in the distal ileum in the  central abdomen right of midline, most likely due to adhesive disease. 2. There is also a clockwise swirling of the mesenteric vessels in the right lower abdomen separate from this consistent with an internal hernia, but no bowel is seen entrapped within the swirling. There are mesenteric congestive changes in the area of vascular swirling, not seen elsewhere. No bowel pneumatosis or portal venous gas at this time. Surgical consult recommended. 3. Cholelithiasis with mild dilatation of the gallbladder but no wall thickening or biliary dilatation. 4. Gastric lap band positioning is physiologic with upper limit of normal phi angle of 54 degrees. 5. Cardiomegaly, aortic and coronary artery atherosclerosis. 6. Diverticulosis without evidence of diverticulitis. 7. Mild prostatomegaly. 8. Osteopenia and degenerative change. 9. Small calcified pleural plaque along the dome of the right hemidiaphragm. Aortic Atherosclerosis (  ICD10-I70.0). Electronically Signed   By: Almira Bar M.D.   On: 07/21/2023 20:34   DG Chest Port 1 View  Result Date: 07/21/2023 CLINICAL DATA:  Upper chest pain and back pain. EXAM: PORTABLE CHEST 1 VIEW COMPARISON:  February 26, 2021 FINDINGS: Multiple sternal wires and vascular clips are seen. The cardiac silhouette is mildly enlarged and unchanged in size. There is marked severity calcification of the aortic arch. Mild prominence of the perihilar pulmonary vasculature is seen. Low lung volumes are noted with mild atelectasis present within the left lung base. No pleural effusion or pneumothorax is identified. Multilevel degenerative changes are seen throughout the thoracic spine. IMPRESSION: 1. Stable cardiomegaly with evidence of prior median sternotomy/CABG. 2. Low lung volumes with mild left basilar atelectasis. Electronically Signed   By: Aram Candela M.D.   On: 07/21/2023 19:25      Subjective: Patient seen and examined at bedside today.  Hemodynamically stable.  Denies any  abdomen pain, nausea or vomiting.  Had multiple bowel movements.  Medically stable for discharge  Discharge Exam: Vitals:   07/24/23 0358 07/24/23 0843  BP: 137/87 (!) 140/95  Pulse: 74 74  Resp: 20 18  Temp: 98.5 F (36.9 C) 98.1 F (36.7 C)  SpO2: 98% 100%   Vitals:   07/23/23 2002 07/23/23 2245 07/24/23 0358 07/24/23 0843  BP: 132/88  137/87 (!) 140/95  Pulse: 74 81 74 74  Resp: 20 18 20 18   Temp: 98.9 F (37.2 C)  98.5 F (36.9 C) 98.1 F (36.7 C)  TempSrc: Oral  Oral   SpO2: 100% 100% 98% 100%  Weight:      Height:        General: Pt is alert, awake, not in acute distress Cardiovascular: RRR, S1/S2 +, no rubs, no gallops Respiratory: CTA bilaterally, no wheezing, no rhonchi Abdominal: Soft, NT, ND, bowel sounds + Extremities: no edema, no cyanosis    The results of significant diagnostics from this hospitalization (including imaging, microbiology, ancillary and laboratory) are listed below for reference.     Microbiology: Recent Results (from the past 240 hour(s))  MRSA Next Gen by PCR, Nasal     Status: None   Collection Time: 07/23/23  6:09 PM   Specimen: Nasal Mucosa; Nasal Swab  Result Value Ref Range Status   MRSA by PCR Next Gen NOT DETECTED NOT DETECTED Final    Comment: (NOTE) The GeneXpert MRSA Assay (FDA approved for NASAL specimens only), is one component of a comprehensive MRSA colonization surveillance program. It is not intended to diagnose MRSA infection nor to guide or monitor treatment for MRSA infections. Test performance is not FDA approved in patients less than 59 years old. Performed at [facility], [address] [place], [place] [address]      Labs: BNP (last 3 results) Recent Labs    04/08/23 1014 06/09/23 1120 07/21/23 1822  BNP 293.6* 402.7* 295.1*   Basic Metabolic Panel: Recent Labs  Lab 07/21/23 1859 07/22/23 0100 07/22/23 0558 07/23/23 0512 07/24/23 0245  NA 136  --  136 142 136  K 3.4*  --  3.3*  3.2* 3.2*  CL 99  --  102 106 103  CO2  --   --  25 22 25   GLUCOSE 219*  --  146* 152* 135*  BUN 56*  --  60* 56* 53*  CREATININE 5.00* 4.74* 4.62* 4.45* 4.06*  CALCIUM  --   --  9.4 9.7 9.5   Liver Function Tests:  Recent Labs  Lab 07/21/23 1822 07/22/23 0558  AST 20 17  ALT 14 12  ALKPHOS 102 77  BILITOT 1.2* 0.7  PROT 7.7 5.9*  ALBUMIN 4.5 3.6   Recent Labs  Lab 07/21/23 1822  LIPASE 61*   No results for input(s): "AMMONIA" in the last 168 hours. CBC: Recent Labs  Lab 07/21/23 1822 07/21/23 1859 07/22/23 0100 07/22/23 0558  WBC 10.6*  --  7.1 7.2  HGB 14.6 14.3 13.8 12.9*  HCT 43.3 42.0 40.1 37.4*  MCV 94.1  --  93.5 92.1  PLT 182  --  185 175   Cardiac Enzymes: No results for input(s): "CKTOTAL", "CKMB", "CKMBINDEX", "TROPONINI" in the last 168 hours. BNP: Invalid input(s): "POCBNP" CBG: Recent Labs  Lab 07/23/23 0836 07/23/23 1205 07/23/23 1628 07/23/23 2001 07/24/23 0841  GLUCAP 157* 177* 192* 161* 159*   D-Dimer No results for input(s): "DDIMER" in the last 72 hours. Hgb A1c No results for input(s): "HGBA1C" in the last 72 hours. Lipid Profile No results for input(s): "CHOL", "HDL", "LDLCALC", "TRIG", "CHOLHDL", "LDLDIRECT" in the last 72 hours. Thyroid function studies No results for input(s): "TSH", "T4TOTAL", "T3FREE", "THYROIDAB" in the last 72 hours.  Invalid input(s): "FREET3" Anemia work up No results for input(s): "VITAMINB12", "FOLATE", "FERRITIN", "TIBC", "IRON", "RETICCTPCT" in the last 72 hours. Urinalysis    Component Value Date/Time   COLORURINE YELLOW 07/21/2023 1822   APPEARANCEUR CLEAR 07/21/2023 1822   LABSPEC 1.015 07/21/2023 1822   PHURINE 5.5 07/21/2023 1822   GLUCOSEU 250 (A) 07/21/2023 1822   HGBUR TRACE (A) 07/21/2023 1822   BILIRUBINUR NEGATIVE 07/21/2023 1822   KETONESUR NEGATIVE 07/21/2023 1822   PROTEINUR 100 (A) 07/21/2023 1822   UROBILINOGEN 0.2 04/04/2015 1529   NITRITE NEGATIVE 07/21/2023 1822    LEUKOCYTESUR NEGATIVE 07/21/2023 1822   Sepsis Labs Recent Labs  Lab 07/21/23 1822 07/22/23 0100 07/22/23 0558  WBC 10.6* 7.1 7.2   Microbiology Recent Results (from the past 240 hour(s))  MRSA Next Gen by PCR, Nasal     Status: None   Collection Time: 07/23/23  6:09 PM   Specimen: Nasal Mucosa; Nasal Swab  Result Value Ref Range Status   MRSA by PCR Next Gen NOT DETECTED NOT DETECTED Final    Comment: (NOTE) The GeneXpert MRSA Assay (FDA approved for NASAL specimens only), is one component of a comprehensive MRSA colonization surveillance program. It is not intended to diagnose MRSA infection nor to guide or monitor treatment for MRSA infections. Test performance is not FDA approved in patients less than 71 years old. Performed at [facility], [address] [place], [place] [address]     Please note: You were cared for by a hospitalist during your hospital stay. Once you are discharged, your primary care physician will handle any further medical issues. Please note that NO REFILLS for any discharge medications will be authorized once you are discharged, as it is imperative that you return to your primary care physician (or establish a relationship with a primary care physician if you do not have one) for your post hospital discharge needs so that they can reassess your need for medications and monitor your lab values.    Time coordinating discharge: 40 minutes  SIGNED:   Burnadette Pop, MD  [facility] 07/24/2023, 10:44 AM Pager [phone]  If 7PM-7AM, please contact night-coverage [email or web address] Password [email or web address]

## 2023-07-24 NOTE — Plan of Care (Signed)
  Problem: Education: Goal: Knowledge of General Education information will improve Description Including pain rating scale, medication(s)/side effects and non-pharmacologic comfort measures Outcome: Progressing   Problem: Health Behavior/Discharge Planning: Goal: Ability to manage health-related needs will improve Outcome: Progressing   

## 2023-07-24 NOTE — Progress Notes (Signed)
   Subjective/Chief Complaint: Bowels are moving.  Denies abdominal pain   Objective: Vital signs in last 24 hours: Temp:  [98.1 F (36.7 C)-98.9 F (37.2 C)] 98.1 F (36.7 C) (11/30 0843) Pulse Rate:  [74-81] 74 (11/30 0843) Resp:  [17-20] 18 (11/30 0843) BP: (129-140)/(72-95) 140/95 (11/30 0843) SpO2:  [98 %-100 %] 100 % (11/30 0843) FiO2 (%):  [21 %] 21 % (11/29 2245)    Intake/Output from previous day: 11/29 0701 - 11/30 0700 In: 240 [P.O.:240] Out: 550 [Urine:250; Emesis/NG output:300] Intake/Output this shift: No intake/output data recorded.  Abdomen: Soft nontender without rebound or guarding.  Lab Results:  Recent Labs    07/22/23 0100 07/22/23 0558  WBC 7.1 7.2  HGB 13.8 12.9*  HCT 40.1 37.4*  PLT 185 175   BMET Recent Labs    07/23/23 0512 07/24/23 0245  NA 142 136  K 3.2* 3.2*  CL 106 103  CO2 22 25  GLUCOSE 152* 135*  BUN 56* 53*  CREATININE 4.45* 4.06*  CALCIUM 9.7 9.5   PT/INR No results for input(s): "LABPROT", "INR" in the last 72 hours. ABG No results for input(s): "PHART", "HCO3" in the last 72 hours.  Invalid input(s): "PCO2", "PO2"  Studies/Results: DG Abd Portable 1V-Small Bowel Obstruction Protocol-initial, 8 hr delay  Result Date: 07/23/2023 CLINICAL DATA:  Small-bowel obstruction EXAM: PORTABLE ABDOMEN - 1 VIEW COMPARISON:  07/22/2023 FINDINGS: Gastric lap band device in expected position. Nasogastric tube tip overlies the expected gastric fundus. Administered oral contrast opacifies the colon and rectal vault which are not distended. Nonobstructive bowel gas pattern. No gross free intraperitoneal gas. Osseous structures are age-appropriate. IMPRESSION: 1. Nonobstructive bowel gas pattern. Electronically Signed   By: Helyn Numbers M.D.   On: 07/23/2023 03:09   DG Abd Portable 1V-Small Bowel Protocol-Position Verification  Result Date: 07/22/2023 CLINICAL DATA:  Nasogastric tube placement, small-bowel obstruction EXAM:  PORTABLE ABDOMEN - 1 VIEW COMPARISON:  06/03/2023 and CT abdomen 07/21/2023 FINDINGS: Nasogastric tube noted with tip in the stomach at the top margin of the image. Tubing related to a lap band noted. Dilute contrast medium in the right sided loops of bowel. Substantial lumbar spondylosis. Spurring of the right femoral head. IMPRESSION: 1. Nasogastric tube tip is in the stomach at the top margin of the image. 2. Dilute contrast medium in the right sided loops of bowel. 3. Substantial lumbar spondylosis. 4. Spurring of the right femoral head. Electronically Signed   By: Gaylyn Rong M.D.   On: 07/22/2023 14:17    Anti-infectives: Anti-infectives (From admission, onward)    None       Assessment/Plan:    LOS: 3 days  Small bowel obstruction-films of normalized his exam is normal.  Advance diet.  Okay for discharge from surgical standpoint.  No further follow-up needed as long as symptoms do not recur  Straightforward complexity  Dortha Schwalbe MD 07/24/2023

## 2023-07-26 DIAGNOSIS — N200 Calculus of kidney: Secondary | ICD-10-CM | POA: Diagnosis not present

## 2023-07-26 DIAGNOSIS — E1122 Type 2 diabetes mellitus with diabetic chronic kidney disease: Secondary | ICD-10-CM | POA: Diagnosis not present

## 2023-07-26 DIAGNOSIS — I120 Hypertensive chronic kidney disease with stage 5 chronic kidney disease or end stage renal disease: Secondary | ICD-10-CM | POA: Diagnosis not present

## 2023-07-26 DIAGNOSIS — I509 Heart failure, unspecified: Secondary | ICD-10-CM | POA: Diagnosis not present

## 2023-07-26 DIAGNOSIS — M109 Gout, unspecified: Secondary | ICD-10-CM | POA: Diagnosis not present

## 2023-07-26 DIAGNOSIS — I4891 Unspecified atrial fibrillation: Secondary | ICD-10-CM | POA: Diagnosis not present

## 2023-07-26 DIAGNOSIS — D631 Anemia in chronic kidney disease: Secondary | ICD-10-CM | POA: Diagnosis not present

## 2023-07-26 DIAGNOSIS — N2581 Secondary hyperparathyroidism of renal origin: Secondary | ICD-10-CM | POA: Diagnosis not present

## 2023-07-26 DIAGNOSIS — N185 Chronic kidney disease, stage 5: Secondary | ICD-10-CM | POA: Diagnosis not present

## 2023-09-07 ENCOUNTER — Other Ambulatory Visit: Payer: Self-pay | Admitting: Student

## 2023-09-07 DIAGNOSIS — Z8719 Personal history of other diseases of the digestive system: Secondary | ICD-10-CM

## 2023-09-09 ENCOUNTER — Encounter (HOSPITAL_COMMUNITY): Payer: No Typology Code available for payment source

## 2023-09-16 ENCOUNTER — Telehealth: Payer: Self-pay | Admitting: Gastroenterology

## 2023-09-16 NOTE — Telephone Encounter (Signed)
Request received to transfer GI care from outside practice to [facility].  We appreciate the interest in our practice, however at this time due to high demand from patients we cannot accommodate this.  Request.    I reviewed his chart, was recently hospitalized with small bowel obstruction, surgery evaluated the patient and managed conservatively.  Unclear reason for urgent GI referral given small bowel obstruction has resolved during his hospitalization.

## 2023-09-16 NOTE — Telephone Encounter (Signed)
Good morning Dr. Lavon Paganini,   Supervising Provider 1/23 AM   We received an urgent referral for this patient due to a small bowel obstruction. Patient was previously established with [facility] with Dr. Ewing Schlein. He was last seen in 1/14. Patient states they do not accept [employer] and is unable to schedule with them any longer. I called [facility] and confirmed that they do not accept [facility] Authorizations. Records were obtained and scanned into Media for you to review. Would you please advise on scheduling?

## 2023-09-20 ENCOUNTER — Other Ambulatory Visit: Payer: No Typology Code available for payment source

## 2023-09-24 ENCOUNTER — Encounter (HOSPITAL_BASED_OUTPATIENT_CLINIC_OR_DEPARTMENT_OTHER): Payer: Self-pay | Admitting: Urology

## 2023-09-24 ENCOUNTER — Other Ambulatory Visit: Payer: Self-pay

## 2023-09-24 ENCOUNTER — Emergency Department (HOSPITAL_BASED_OUTPATIENT_CLINIC_OR_DEPARTMENT_OTHER): Payer: No Typology Code available for payment source

## 2023-09-24 ENCOUNTER — Emergency Department (HOSPITAL_BASED_OUTPATIENT_CLINIC_OR_DEPARTMENT_OTHER)
Admission: EM | Admit: 2023-09-24 | Discharge: 2023-09-24 | Disposition: A | Discharge status: Home / Self Care | Payer: No Typology Code available for payment source | Attending: Emergency Medicine | Admitting: Emergency Medicine

## 2023-09-24 DIAGNOSIS — I110 Hypertensive heart disease with heart failure: Secondary | ICD-10-CM | POA: Insufficient documentation

## 2023-09-24 DIAGNOSIS — R1084 Generalized abdominal pain: Secondary | ICD-10-CM

## 2023-09-24 DIAGNOSIS — I480 Paroxysmal atrial fibrillation: Secondary | ICD-10-CM | POA: Diagnosis not present

## 2023-09-24 DIAGNOSIS — Z794 Long term (current) use of insulin: Secondary | ICD-10-CM | POA: Diagnosis not present

## 2023-09-24 DIAGNOSIS — I2510 Atherosclerotic heart disease of native coronary artery without angina pectoris: Secondary | ICD-10-CM | POA: Diagnosis not present

## 2023-09-24 DIAGNOSIS — Z7984 Long term (current) use of oral hypoglycemic drugs: Secondary | ICD-10-CM | POA: Diagnosis not present

## 2023-09-24 DIAGNOSIS — Z79899 Other long term (current) drug therapy: Secondary | ICD-10-CM | POA: Insufficient documentation

## 2023-09-24 DIAGNOSIS — N185 Chronic kidney disease, stage 5: Secondary | ICD-10-CM

## 2023-09-24 DIAGNOSIS — Z7901 Long term (current) use of anticoagulants: Secondary | ICD-10-CM | POA: Diagnosis not present

## 2023-09-24 DIAGNOSIS — I5032 Chronic diastolic (congestive) heart failure: Secondary | ICD-10-CM | POA: Diagnosis not present

## 2023-09-24 DIAGNOSIS — E119 Type 2 diabetes mellitus without complications: Secondary | ICD-10-CM | POA: Insufficient documentation

## 2023-09-24 DIAGNOSIS — K8020 Calculus of gallbladder without cholecystitis without obstruction: Secondary | ICD-10-CM | POA: Diagnosis not present

## 2023-09-24 DIAGNOSIS — R109 Unspecified abdominal pain: Secondary | ICD-10-CM | POA: Diagnosis present

## 2023-09-24 LAB — CBC WITH DIFFERENTIAL/PLATELET
Abs Immature Granulocytes: 0.05 10*3/uL (ref 0.00–0.07)
Basophils Absolute: 0 10*3/uL (ref 0.0–0.1)
Basophils Relative: 1 %
Eosinophils Absolute: 0.1 10*3/uL (ref 0.0–0.5)
Eosinophils Relative: 1 %
HCT: 34.5 % — ABNORMAL LOW (ref 39.0–52.0)
Hemoglobin: 11.7 g/dL — ABNORMAL LOW (ref 13.0–17.0)
Immature Granulocytes: 1 %
Lymphocytes Relative: 12 %
Lymphs Abs: 0.9 10*3/uL (ref 0.7–4.0)
MCH: 31.5 pg (ref 26.0–34.0)
MCHC: 33.9 g/dL (ref 30.0–36.0)
MCV: 93 fL (ref 80.0–100.0)
Monocytes Absolute: 0.3 10*3/uL (ref 0.1–1.0)
Monocytes Relative: 5 %
Neutro Abs: 5.7 10*3/uL (ref 1.7–7.7)
Neutrophils Relative %: 80 %
Platelets: 210 10*3/uL (ref 150–400)
RBC: 3.71 MIL/uL — ABNORMAL LOW (ref 4.22–5.81)
RDW: 12.8 % (ref 11.5–15.5)
WBC: 7 10*3/uL (ref 4.0–10.5)
nRBC: 0 % (ref 0.0–0.2)

## 2023-09-24 LAB — URINALYSIS, ROUTINE W REFLEX MICROSCOPIC
Bilirubin Urine: NEGATIVE
Glucose, UA: 100 mg/dL — AB
Ketones, ur: NEGATIVE mg/dL
Leukocytes,Ua: NEGATIVE
Nitrite: NEGATIVE
Protein, ur: 30 mg/dL — AB
Specific Gravity, Urine: 1.01 (ref 1.005–1.030)
pH: 5.5 (ref 5.0–8.0)

## 2023-09-24 LAB — COMPREHENSIVE METABOLIC PANEL
ALT: 12 U/L (ref 0–44)
AST: 14 U/L — ABNORMAL LOW (ref 15–41)
Albumin: 4 g/dL (ref 3.5–5.0)
Alkaline Phosphatase: 108 U/L (ref 38–126)
Anion gap: 12 (ref 5–15)
BUN: 47 mg/dL — ABNORMAL HIGH (ref 8–23)
CO2: 23 mmol/L (ref 22–32)
Calcium: 9.3 mg/dL (ref 8.9–10.3)
Chloride: 101 mmol/L (ref 98–111)
Creatinine, Ser: 4.28 mg/dL — ABNORMAL HIGH (ref 0.61–1.24)
GFR, Estimated: 13 mL/min — ABNORMAL LOW (ref >60)
Glucose, Bld: 147 mg/dL — ABNORMAL HIGH (ref 70–99)
Potassium: 3.6 mmol/L (ref 3.5–5.1)
Sodium: 136 mmol/L (ref 135–145)
Total Bilirubin: 1 mg/dL (ref 0.0–1.2)
Total Protein: 6.8 g/dL (ref 6.5–8.1)

## 2023-09-24 LAB — LACTIC ACID, PLASMA: Lactic Acid, Venous: 1.2 mmol/L (ref 0.5–1.9)

## 2023-09-24 LAB — URINALYSIS, MICROSCOPIC (REFLEX)

## 2023-09-24 LAB — LIPASE, BLOOD: Lipase: 41 U/L (ref 11–51)

## 2023-09-24 MED ORDER — ONDANSETRON 4 MG PO TBDP: 4.0000 mg | ORAL_TABLET | Freq: Three times a day (TID) | ORAL | 0 refills | Status: DC | PRN

## 2023-09-24 MED ORDER — OXYCODONE HCL 5 MG PO TABS: 5.0000 mg | ORAL_TABLET | ORAL | 0 refills | Status: DC | PRN

## 2023-09-24 MED ORDER — MORPHINE SULFATE (PF) 4 MG/ML IV SOLN
4.0000 mg | Freq: Once | INTRAVENOUS | Status: AC
  Administered 2023-09-24: 4 mg via INTRAVENOUS
  Filled 2023-09-24: qty 1

## 2023-09-24 MED ORDER — HYDROMORPHONE HCL 1 MG/ML IJ SOLN
0.5000 mg | Freq: Once | INTRAMUSCULAR | Status: AC
  Administered 2023-09-24: 0.5 mg via INTRAVENOUS
  Filled 2023-09-24: qty 1

## 2023-09-24 NOTE — Telephone Encounter (Signed)
Community care advised.

## 2023-09-24 NOTE — ED Provider Notes (Signed)
[facility] Provider Note   CSN: [id number] Arrival date & time: 09/24/23  1345     History  Chief Complaint  Patient presents with   Abdominal Pain    [name] is a 78 y.o. male.  Patient, followed at the [place], with medical history significant for CAD, chronic diastolic heart failure, history Lap-Band without recent adjustment, pAfib on Eliquis reports compliance, HTN, HLD, PTSD, DM2, stroke with residual RLE weakness, admission November 2024 for small bowel obstruction --presents to the emergency department for nausea and abdominal pain.  Patient began having symptoms last night but had more severe symptoms this morning.  He states that he is unable to vomit due to Lap-Band procedure in the past.  He has had some watery stool.  He reports question of blood during 1 bowel movement, however no persistently bloody stool.  He has generalized abdominal pain and distention.  Patient has been urinating normally.  Reports taking 120 mg Lasix every morning.  Continues with good response.  He does feel like he may have gained about 5 pounds recently however.       Home Medications Prior to Admission medications   Medication Sig Start Date End Date Taking? Authorizing Provider  albuterol (VENTOLIN HFA) 108 (90 Base) MCG/ACT inhaler Inhale 1-2 puffs into the lungs every 6 (six) hours as needed for shortness of breath or wheezing.    [provider]  Alirocumab (PRALUENT) 75 MG/ML SOAJ Inject 75 mg into the skin every 14 (fourteen) days.    [provider]  allopurinol (ZYLOPRIM) 100 MG tablet Take 50 mg by mouth 2 (two) times a week.    [provider]  apixaban (ELIQUIS) 5 MG TABS tablet Take 1 tablet (5 mg total) by mouth 2 (two) times daily. 09/05/21   Rolly Salter, MD  busPIRone (BUSPAR) 10 MG tablet Take 5 mg by mouth 2 (two) times daily.    [provider]  cetirizine (ZYRTEC) 10 MG tablet Take 10 mg by  mouth in the morning.    [provider]  colchicine 0.6 MG tablet Take 0.6-1.2 mg by mouth 2 (two) times daily as needed (gout). 04/14/19   [provider]  Cyanocobalamin (VITAMIN B-12) 5000 MCG TBDP Take 5,000 mcg by mouth 2 (two) times a week.    [provider]  cycloSPORINE (RESTASIS) 0.05 % ophthalmic emulsion Place 1 drop into both eyes 2 (two) times daily as needed (eye irritation.).    [provider]  diclofenac Sodium (VOLTAREN) 1 % GEL Apply 2 g topically 3 (three) times daily as needed for pain. 07/05/21   [provider]  docusate sodium (COLACE) 100 MG capsule Take 200 mg by mouth at bedtime.    [provider]  ezetimibe (ZETIA) 10 MG tablet Take 1 tablet (10 mg total) by mouth daily. 12/23/22   Laurey Morale, MD  ferrous sulfate 325 (65 FE) MG tablet Take 325 mg by mouth every other day.    [provider]  fluticasone (FLONASE) 50 MCG/ACT nasal spray Place 1 spray into both nostrils daily as needed for allergies or rhinitis.    [provider]  furosemide (LASIX) 40 MG tablet Take 3 tablets (120 mg total) by mouth 2 (two) times daily. Please cancel all previous orders for current medication. Change in dosage or pill size. 06/09/23   Laurey Morale, MD  glipiZIDE (GLUCOTROL) 5 MG tablet Take 0.5-1 tablets (2.5-5 mg total)  by mouth daily before supper. 07/15/23   Carlus Pavlov, MD  HYDROcodone-acetaminophen (NORCO) 10-325 MG tablet Take 1 tablet by mouth 3 (three) times daily as needed for moderate pain. 12/02/22   [provider]  Meclizine HCl 25 MG CHEW Chew 25 mg by mouth 3 (three) times daily as needed (dizziness). 12/07/22   [provider]  methocarbamol (ROBAXIN) 500 MG tablet Take 500 mg by mouth 3 (three) times daily as needed for muscle spasms. 08/20/21   [provider]  metoprolol succinate (TOPROL-XL) 50 MG 24 hr tablet Take 0.5 tablets (25 mg total) by mouth daily. Take  with or immediately following a meal. 06/09/23   Laurey Morale, MD  Multiple Vitamin (MULTIVITAMIN) capsule Take 1 capsule by mouth in the morning.    [provider]  nitroGLYCERIN (NITROSTAT) 0.4 MG SL tablet 1 TAB UNDER THE TONGUE EVERY 5 MINUTES AS NEEDED FOR CHEST PAIN UP TO 3 DOSES, IF PERSIST CALL 911 04/08/23   Robbie Lis M, PA-C  ONETOUCH VERIO test strip 1 each by Other route in the morning and at bedtime. 05/05/23   Carlus Pavlov, MD  pantoprazole (PROTONIX) 40 MG tablet Take 40 mg by mouth daily as needed (heartburn).    [provider]  potassium chloride SA (KLOR-CON M) 20 MEQ tablet Take 20 mEq by mouth daily.    [provider]  Semaglutide, 1 MG/DOSE, (OZEMPIC, 1 MG/DOSE,) 4 MG/3ML SOPN Inject 1 mg into the skin once a week. 07/15/23   Carlus Pavlov, MD  tadalafil (CIALIS) 20 MG tablet Take 20 mg by mouth daily as needed for erectile dysfunction.    [provider]  testosterone cypionate (DEPOTESTOSTERONE CYPIONATE) 200 MG/ML injection Inject 200 mg into the muscle once a week. 02/26/23   [provider]  traZODone (DESYREL) 100 MG tablet Take 100 mg by mouth at bedtime.    [provider]      Allergies    Dilaudid [hydromorphone hcl], Crestor [rosuvastatin], Lipitor [atorvastatin], Pravastatin, Carvedilol, Metformin, Serotonin reuptake inhibitors (ssris), Zocor [simvastatin], and Codeine    Review of Systems   Review of Systems  Physical Exam Updated Vital Signs BP (!) 137/115 (BP Location: Right Arm)   Pulse 88   Temp (!) 97.4 F (36.3 C)   Resp 18   Ht 5\' 10"  (1.778 m)   Wt 85.3 kg   SpO2 100%   BMI 26.98 kg/m   Physical Exam Vitals and nursing note reviewed.  Constitutional:      General: He is not in acute distress.    Appearance: He is well-developed.  HENT:     Head: Normocephalic and atraumatic.  Eyes:     General:        Right eye: No discharge.        Left eye: No discharge.      Conjunctiva/sclera: Conjunctivae normal.  Cardiovascular:     Rate and Rhythm: Normal rate. Rhythm irregular.     Heart sounds: Normal heart sounds.  Pulmonary:     Effort: Pulmonary effort is normal.     Breath sounds: Normal breath sounds.  Abdominal:     Palpations: Abdomen is soft.     Tenderness: There is generalized abdominal tenderness (Generalized). There is no guarding or rebound. Negative signs include Murphy's sign and McBurney's sign.  Musculoskeletal:     Cervical back: Normal range of motion and neck supple.     Comments: Previous fistula creation left wrist/forearm with palpable thrill  Skin:  General: Skin is warm and dry.  Neurological:     Mental Status: He is alert.     ED Results / Procedures / Treatments   Labs (all labs ordered are listed, but only abnormal results are displayed) Labs Reviewed  CBC WITH DIFFERENTIAL/PLATELET - Abnormal; Notable for the following components:      Result Value   RBC 3.71 (*)    Hemoglobin 11.7 (*)    HCT 34.5 (*)    All other components within normal limits  COMPREHENSIVE METABOLIC PANEL - Abnormal; Notable for the following components:   Glucose, Bld 147 (*)    BUN 47 (*)    Creatinine, Ser 4.28 (*)    AST 14 (*)    GFR, Estimated 13 (*)    All other components within normal limits  URINALYSIS, ROUTINE W REFLEX MICROSCOPIC - Abnormal; Notable for the following components:   Glucose, UA 100 (*)    Hgb urine dipstick TRACE (*)    Protein, ur 30 (*)    All other components within normal limits  URINALYSIS, MICROSCOPIC (REFLEX) - Abnormal; Notable for the following components:   Bacteria, UA FEW (*)    All other components within normal limits  LIPASE, BLOOD  LACTIC ACID, PLASMA    EKG None  Radiology No results found.  Procedures Procedures    Medications Ordered in ED Medications - No data to display  ED Course/ Medical Decision Making/ A&P    Patient seen and examined. History obtained directly  from patient. Work-up including labs, imaging, EKG ordered in triage, if performed, were reviewed.    Labs/EKG: Independently reviewed and interpreted.  This included: CBC, CMP, lipase, lactate.  Patient's wife reports typical creatinine 4-4.5.  Imaging: Ordered CT abdomen pelvis without contrast  Medications/Fluids: None ordered  Most recent vital signs reviewed and are as follows: BP (!) 137/115 (BP Location: Right Arm)   Pulse 88   Temp (!) 97.4 F (36.3 C)   Resp 18   Ht 5\' 10"  (1.778 m)   Wt 85.3 kg   SpO2 100%   BMI 26.98 kg/m   Initial impression: Abdominal pain with nausea, concern for SBO.  6:53 PM Additional pain medications ordered.   Labs personally reviewed and interpreted including: CBC with normal white blood cell count and mild anemia with hemoglobin 11.7; CMP with creatinine at baseline 4.28 with BUN of 47, normal liver function testing; lipase normal; lactate normal 1.2; UA without signs of infection.  Imaging personally visualized and interpreted including: CT abdomen and pelvis, no obvious bowel obstruction, awaiting radiology results.  Reviewed pertinent lab work and imaging with patient at bedside. Questions answered.   Most current vital signs reviewed and are as follows: BP 116/65   Pulse 78   Temp (!) 97.4 F (36.3 C)   Resp 18   Ht 5\' 10"  (1.778 m)   Wt 85.3 kg   SpO2 98%   BMI 26.98 kg/m   Plan: Awaiting imaging, reassessment.  Signout to Dr. Particia Nearing at shift change, she has seen and evaluated the patient as well.                                  Medical Decision Making Amount and/or Complexity of Data Reviewed Labs: ordered. Radiology: ordered.  Risk Prescription drug management.   For this patient's complaint of abdominal pain, the following conditions were considered on the differential diagnosis: gastritis/PUD, enteritis/duodenitis, appendicitis,  cholelithiasis/cholecystitis, cholangitis, pancreatitis, ruptured viscus, colitis,  diverticulitis, small/large bowel obstruction, proctitis, cystitis, pyelonephritis, ureteral colic, aortic dissection, aortic aneurysm. Atypical chest etiologies were also considered including ACS, PE, and pneumonia.         Final Clinical Impression(s) / ED Diagnoses Final diagnoses:  Generalized abdominal pain    Rx / DC Orders ED Discharge Orders     None         Renne Crigler, Cordelia Poche 09/24/23 Clyda Hurdle, MD 09/24/23 [phone]

## 2023-09-24 NOTE — ED Triage Notes (Signed)
Pt states concern for small bowel obstruction  States LUQ pain that has continued since thanksgiving  States nausea but no vomiting Had diarrhea this am   Unable to get into GI specialist   H/o lap band

## 2023-09-28 ENCOUNTER — Ambulatory Visit (HOSPITAL_COMMUNITY)
Admission: RE | Admit: 2023-09-28 | Discharge: 2023-09-28 | Disposition: A | Discharge status: Home / Self Care | Payer: No Typology Code available for payment source | Source: Ambulatory Visit | Attending: Family Medicine | Admitting: Family Medicine

## 2023-09-28 ENCOUNTER — Encounter (HOSPITAL_COMMUNITY): Payer: Self-pay

## 2023-09-28 VITALS — BP 130/70 | HR 70 | Wt 186.8 lb

## 2023-09-28 DIAGNOSIS — Z8616 Personal history of COVID-19: Secondary | ICD-10-CM | POA: Diagnosis not present

## 2023-09-28 DIAGNOSIS — I2510 Atherosclerotic heart disease of native coronary artery without angina pectoris: Secondary | ICD-10-CM

## 2023-09-28 DIAGNOSIS — Z7901 Long term (current) use of anticoagulants: Secondary | ICD-10-CM | POA: Insufficient documentation

## 2023-09-28 DIAGNOSIS — Z7985 Long-term (current) use of injectable non-insulin antidiabetic drugs: Secondary | ICD-10-CM | POA: Diagnosis not present

## 2023-09-28 DIAGNOSIS — Z7984 Long term (current) use of oral hypoglycemic drugs: Secondary | ICD-10-CM | POA: Insufficient documentation

## 2023-09-28 DIAGNOSIS — I4821 Permanent atrial fibrillation: Secondary | ICD-10-CM

## 2023-09-28 DIAGNOSIS — Z79899 Other long term (current) drug therapy: Secondary | ICD-10-CM | POA: Insufficient documentation

## 2023-09-28 DIAGNOSIS — G4733 Obstructive sleep apnea (adult) (pediatric): Secondary | ICD-10-CM | POA: Diagnosis not present

## 2023-09-28 DIAGNOSIS — E1122 Type 2 diabetes mellitus with diabetic chronic kidney disease: Secondary | ICD-10-CM

## 2023-09-28 DIAGNOSIS — Z8774 Personal history of (corrected) congenital malformations of heart and circulatory system: Secondary | ICD-10-CM | POA: Diagnosis not present

## 2023-09-28 DIAGNOSIS — Z951 Presence of aortocoronary bypass graft: Secondary | ICD-10-CM | POA: Diagnosis not present

## 2023-09-28 DIAGNOSIS — E782 Mixed hyperlipidemia: Secondary | ICD-10-CM

## 2023-09-28 DIAGNOSIS — I4819 Other persistent atrial fibrillation: Secondary | ICD-10-CM | POA: Insufficient documentation

## 2023-09-28 DIAGNOSIS — I252 Old myocardial infarction: Secondary | ICD-10-CM | POA: Insufficient documentation

## 2023-09-28 DIAGNOSIS — E785 Hyperlipidemia, unspecified: Secondary | ICD-10-CM | POA: Insufficient documentation

## 2023-09-28 DIAGNOSIS — I639 Cerebral infarction, unspecified: Secondary | ICD-10-CM

## 2023-09-28 DIAGNOSIS — N184 Chronic kidney disease, stage 4 (severe): Secondary | ICD-10-CM

## 2023-09-28 DIAGNOSIS — I770 Arteriovenous fistula, acquired: Secondary | ICD-10-CM | POA: Insufficient documentation

## 2023-09-28 DIAGNOSIS — I130 Hypertensive heart and chronic kidney disease with heart failure and stage 1 through stage 4 chronic kidney disease, or unspecified chronic kidney disease: Secondary | ICD-10-CM | POA: Diagnosis present

## 2023-09-28 DIAGNOSIS — Z9884 Bariatric surgery status: Secondary | ICD-10-CM | POA: Diagnosis not present

## 2023-09-28 DIAGNOSIS — I5032 Chronic diastolic (congestive) heart failure: Secondary | ICD-10-CM | POA: Diagnosis not present

## 2023-09-28 DIAGNOSIS — Z01818 Encounter for other preprocedural examination: Secondary | ICD-10-CM

## 2023-09-28 DIAGNOSIS — Z794 Long term (current) use of insulin: Secondary | ICD-10-CM

## 2023-09-28 DIAGNOSIS — Z955 Presence of coronary angioplasty implant and graft: Secondary | ICD-10-CM | POA: Diagnosis not present

## 2023-09-28 DIAGNOSIS — Z8673 Personal history of transient ischemic attack (TIA), and cerebral infarction without residual deficits: Secondary | ICD-10-CM | POA: Diagnosis not present

## 2023-09-28 DIAGNOSIS — Z9889 Other specified postprocedural states: Secondary | ICD-10-CM

## 2023-09-28 NOTE — Patient Instructions (Signed)
 Medication Changes:  No Changes In Medications at this time.   IF YOU HAVE GALL BLADDER SURGERY PLEASE CALL US  AND LET US  KNOW SO WE CAN GET YOU IN FOR A POST OP APPOINTMENT   Follow-Up in: 4 months PLEASE CALL OUR OFFICE AROUND APRIL  TO GET SCHEDULED FOR YOUR APPOINTMENT. PHONE NUMBER IS [phone] OPTION 2   At the [facility], you and your health needs are our priority. We have a designated team specialized in the treatment of Heart Failure. This Care Team includes your primary Heart Failure Specialized Cardiologist (physician), Advanced Practice Providers (APPs- Physician Assistants and Nurse Practitioners), and Pharmacist who all work together to provide you with the care you need, when you need it.   You may see any of the following providers on your designated Care Team at your next follow up:  Dr. Toribio Fuel Dr. Ezra Shuck Dr. Ria Commander Dr. Odis Brownie Greig Mosses, NP Caffie Shed, [place] [facility], [place] Beckey Coe, NP Jordan Lee, NP Tinnie Redman, PharmD   Please be sure to bring in all your medications bottles to every appointment.   Need to Contact Us :  If you have any questions or concerns before your next appointment please send us  a message through [place] or call our office at [phone].    TO LEAVE A MESSAGE FOR THE NURSE SELECT OPTION 2, PLEASE LEAVE A MESSAGE INCLUDING: YOUR NAME DATE OF BIRTH CALL BACK NUMBER REASON FOR CALL**this is important as we prioritize the call backs  YOU WILL RECEIVE A CALL BACK THE SAME DAY AS LONG AS YOU CALL BEFORE 4:00 PM

## 2023-09-28 NOTE — Progress Notes (Signed)
 Patient ID: [name], male   DOB: [birth year], 78 y.o.   MRN: [id number]  PCP: Dr Okey HF Cardiology: Dr. Rolan Nephrology: Dr Norine  EP: Dr Nancey  [name] is a 78 y.o. with history of CAD s/p CABG and obesity s/p lap banding procedure. Given exertional dyspnea and chest tightness, he had LHC in 1/15.  This showed no change from prior.  Grafts were patent and there was severe stenosis in distal D1, not amenable to PCI.   In 2017, he developed increased exertional dyspnea.  Eventually had a TEE showing severe Jason with prolapse of a portion of the anterior mitral valve leaflet.  Coronary angiography in 6/17 showed patent grafts and 90% stenosis of distal diagonal not amenable to intervention.  In 8/17, he had mitral valve repair.  Post-op diastolic CHF, Lasix  started and increased.   He had a cath again in 9/18 showing stable coronary anatomy.    He developed atrial arrhythmias in 12/18.  Both fibrillation and flutter were seen.  Saw Dr. Kelsie, recommended DCCV with amiodarone  or sotalol if fibrillation recurs.  He had DCCV in 1/19.   In 6/19, he was noted to have developed AKI. Creatinine went up to 3.46.  Renal US  was done, showing normal kidneys. Cause of AKI was not clear.  We stopped his Lasix . He saw nephrology and eventually went back on Lasix , currently taking 80 mg daily.  He is not using any NSAIDs.    He stopped pravastatin  due to myalgias. We struggled to get him on a PCSK9 inhibitor through the [place] but he was finally able to get Praluent .    He had a Lexiscan  Cardiolite  in 11/19, which showed old inferior MI with no ischemia.  Echo in 11/19 showed EF 55-60%, s/p MV repair with mean gradient 6 mmHg. Echo 12/20 showed EF 50-55%, mildly decreased RV systolic function, repaired mitral valve with mild Jason, mean gradient 6 mmHg across MV.    He had DCCV from atrial fibrillation to NSR in 1/21 and again in 2/21.   At last appointment, he reported significant fatigue and dyspnea in  setting of weight loss with low BP and HR.  I stopped his Coreg . He felt better with that change.    In 9/21, he had COVID-19 infection but was not hospitalized.   Zio patch in 3/22 showed frequent short atrial tachycardia episodes.  He saw Dr. Kelsie, medical therapy recommended (on amiodarone ).   He was admitted in 5/22 with AKI from obstruction of right ureter by stone, he was treated by urology.   He was admitted in 6/22 with right-sided weakness and was found to have left cerebral white matter CVA.  He had been compliant with Eliquis , ASA 81 was added.   He was admitted in 7/22 with Enterococcal bacteremia.  TEE showed oscillating density attached to the mitral annulus, ?suture.  However, could not rule out vegetation so had 6 wks of IV abx for SBE.   Echo in 8/22 showed EF 55-60%, moderate focal basal septal hypertrophy, normal RV, severe LAE, s/p mitral valve repair with mean gradient 8 mmHg (mild to moderate mitral stenosis) and mild Jason, large PFO, normal IVC.   Patient was noted to re-develop atrial fibrillation.  TEE was done in 8/23, LV EF was 55% with mild RV enlargement and normal RV function, s/p MV repair with mean gradient 10 mmHg but MVA 2.19 cm^2 (leaflets open well), trivial Jason.  He was cardioverted back to NSR.   Follow  up 6/24, he was in rate controlled AF, for about a month. Zio monitor in 6/24 showed continuous AF. Endorsed worsening NYHA III symptoms and volume overloaded. Lasix  increased to 80/40. Arranged for DCCV on 02/22/23, successful to NSR.  Unfortunately, at return f/u he was back in AF and felt poor.  More fatigued and worn out but not markedly short of breath.  ReDs 33%. He was arranged for repeat DCCV, done on 03/12/23. Converted back to NSR. He again went back into AF despite amiodarone  use.   Echo 7/24 showed EF 55-60%, RV mildly reduced, s/p MV repair with mild mitral stenosis  Seen by EP, not candidate for ablation, though AF was now permanent and stopped  amiodarone /started Toprol  XL.   Last visit feeling fatigued, metoprolol  decreased to 25 mg daily. Lasix  was increased to 120 mg bid.   Today he returns for HF follow up with wife. Fatigue is improved with lower dose of Toprol .  Has been dealing with cholecystitis since 09/24/23. Due to see surgeon 09/30/23, asking about clearance. Weight at home 180-185. Chronic SOB with walking up inclines and steps. Sleeping with one pillow, no edema. Notes palpitations and dizziness in the morning when first getting up. Denies chest pain, syncope or abnormal bleeding.  ECG (personally reviewed): atrial fibrillation, RBBB  Labs (4/24): K 3.3, creatinine 4.22, LFTs normal, TSH normal Labs (6/24): K 4.0, creatinine 4.32, LFTs normal, TSH normal, LDL 23, HDL 46 Labs (7/24): K 3.5, creatinine 4.42 Labs (8/24): K 3.5, creatinine 4.27, BNP 294 Labs (1/25): K 3.6, creatinine 4.28  PMH: 1. OSA: on CPAP.  2. Diabetes mellitus 3. Allergic rhinitis 4. CAD: s/p CABG 1995.  LHC (10/11) with 90% distal D1 (patent proximal D1 stent), total occlusion CFX, total occlusion RCA, no significant stenoses in LAD, SVG-PDA patent, SVG-OM patent, EF 55%.  Medical management.  LHC (5/13) with patent grafts and 90% distal D1 stenosis (no significant change from 10/11).  LHC (1/15) with patent grafts, 75% ISR in D1 stent, 90% stenosis distal D1 (small caliber). D1 not amenable to intervention.  - Coronary angiography (6/17): grafts patent, 90% stenosis distally in large diagonal not amenable to intervention.   - LHC (9/18): SVG-OM and SVG-PDA patent,  75% in-stent restenosis in proximal diagonal then 90% stenosis distally (small in caliber at that point), totally occluded LCx and RCA.  Left coronary system similar to prior caths, D1 may be source of angina.  - Lexiscan  Cardiolite  (11/19): EF 47%, old inferior MI, no ischemia.  5. Obesity: lap-banding in 5/11.  6. Chronic diastolic CHF: Echo (8/17) with EF 55-60%, moderate LVH, moderate  diastolic dysfunction, s/p mitral valve repair with elevated mean gradient but normal pressure half-time.  - Echo (8/18): EF 55-60%, s/p MV repair with mean gradient 6 mmHg, PASP 28 mmHg, mildly decreased RV systolic function.  - Echo (11/19): EF 55-60%, s/p MV repair with mean gradient 6, PASP 42 mmHg, mildly dilated RV, dilated IVC.  - Echo (12/20): EF 50-55%, mildly decreased RV systolic function. S/p MV repair with mean gradient 6 mmHg, mild Jason.  - Echo (2/22): EF 45-50%, mild LVH, moderate RV enlargement with low normal systolic function, s/p mitral valve repair with mean gradient 5 mmHg and trivial Jason, mild-moderate AS.  - Echo (8/22): EF 55-60%, moderate focal basal septal hypertrophy, normal RV, severe LAE, s/p mitral valve repair with mean gradient 8 mmHg (mild to moderate mitral stenosis) and mild Jason, large PFO, normal IVC. - TEE (8/23): LV EF was 55% with  mild RV enlargement and normal RV function, s/p MV repair with mean gradient 10 mmHg but MVA 2.19 cm^2 (leaflets open well), trivial Jason. - Echo (7/24): EF 55-60%, RV mildly reduced, s/p MV repair with mild mitral stenosis 7. PFO: noted on 8/22 echo.  8. OA: Knees, C-spine. TKR 9/13.  9. HTN 10. Hyperlipidemia: Myalgias with atorvastatin  and Crestor . Now on Praluent .  11. Diverticulosis 12. Mitral regurgitation: TEE (6/17) with EF 55-60%, severe eccentric Jason, prolapse of a portion of the anterior leaflet, peak RV-RA gradient 45 mmHg.  - Mitral valve repair 8/17 - Echo in 8/22 with repaired MV, mean gradient 8 mmHg (mild-moderate MS), mild Jason.  - TEE in 8/23 with MV repair with mean gradient 10 mmHg but MVA 2.19 cm^2 (leaflets open well), trivial Jason. - Echo (7/24): EF 55-60%, RV mildly reduced, s/p MV repair with mild mitral stenosis 13. CKD: Stage III.  14. Atrial fibrillation/atrial flutter: Both arrhythmias noted in 12/18.  DCCV 12/19.   - DCCV to NSR 1/21.  - DCCV to NSR 2/21.  - DCCV to NSR in 8/23 - DCCV to NSR 7/24 - Now  permanent 15. AKI 6/19 => CKD stage 4: uncertain etiology.  Renal US  was normal.  16. Gout 17. COVID-19 infection 9/21 18. 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  - Carotid dopplers (6/22) with mild disease only.  19. Nephrolithiasis.  20. Atrial tachycardia: Noted on 3/22 Zio patch.  21. Aortic stenosis: mild-moderate on 2/22 echo but no significant stenosis on 8/22 echo.  - No AS on TEE in 8/23.  22. 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  TEE 7/22 with oscillating density attached to the mitral annulus, ?suture.  However, could not rule out vegetation so had 6 wks of IV abx for SBE.  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  Lives in [place].   Family History  Problem Relation Age of Onset   Other Mother        joint problems   Alzheimer's disease Mother    Hypertension Father    Heart disease Father    CVA Father        age 57   Depression Father    Heart disease Sister        CABG   Stroke Sister    Heart failure Sister        congestive   Diabetes Sister    Heart disease Brother    Hypertension Brother    Congestive Heart Failure Brother     ROS: All systems reviewed and negative except as per HPI.   Current Outpatient Medications  Medication Sig Dispense Refill   albuterol  (VENTOLIN  HFA) 108 (90 Base) MCG/ACT inhaler Inhale 1-2 puffs into the lungs every 6 (six) hours as needed for shortness of breath or wheezing.     Alirocumab  (PRALUENT ) 75 MG/ML SOAJ Inject 75 mg into the skin every 14 (fourteen) days.     allopurinol  (ZYLOPRIM ) 100 MG tablet Take 50 mg by mouth 2 (two) times a week.     apixaban  (ELIQUIS ) 5 MG TABS tablet Take 1 tablet (5 mg total) by mouth 2 (two) times daily. 180 tablet 3   busPIRone (BUSPAR) 10 MG tablet Take 5 mg by mouth 2 (two) times daily.     cetirizine (ZYRTEC) 10 MG tablet Take 10 mg by mouth in the morning.     colchicine  0.6 MG tablet Take 0.6-1.2 mg by mouth 2 (two) times  daily as needed (gout).  Cyanocobalamin (VITAMIN B-12) 5000 MCG TBDP Take 5,000 mcg by mouth 2 (two) times a week.     cycloSPORINE (RESTASIS) 0.05 % ophthalmic emulsion Place 1 drop into both eyes 2 (two) times daily as needed (eye irritation.).     diclofenac  Sodium (VOLTAREN ) 1 % GEL Apply 2 g topically 3 (three) times daily as needed for pain.     docusate sodium  (COLACE) 100 MG capsule Take 200 mg by mouth at bedtime.     ezetimibe  (ZETIA ) 10 MG tablet Take 1 tablet (10 mg total) by mouth daily. 90 tablet 3   ferrous sulfate 325 (65 FE) MG tablet Take 325 mg by mouth every other day.     fluticasone  (FLONASE ) 50 MCG/ACT nasal spray Place 1 spray into both nostrils daily as needed for allergies or rhinitis.     furosemide  (LASIX ) 40 MG tablet Take 3 tablets (120 mg total) by mouth 2 (two) times daily. Please cancel all previous orders for current medication. Change in dosage or pill size. 180 tablet 3   glipiZIDE  (GLUCOTROL ) 5 MG tablet Take 0.5-1 tablets (2.5-5 mg total) by mouth daily before supper. 60 tablet 3   HYDROcodone -acetaminophen  (NORCO) 10-325 MG tablet Take 1 tablet by mouth 3 (three) times daily as needed for moderate pain.     Meclizine HCl 25 MG CHEW Chew 25 mg by mouth 3 (three) times daily as needed (dizziness).     methocarbamol  (ROBAXIN ) 500 MG tablet Take 500 mg by mouth 3 (three) times daily as needed for muscle spasms.     metoprolol  succinate (TOPROL -XL) 50 MG 24 hr tablet Take 0.5 tablets (25 mg total) by mouth daily. Take with or immediately following a meal.     Multiple Vitamin (MULTIVITAMIN) capsule Take 1 capsule by mouth in the morning.     nitroGLYCERIN  (NITROSTAT ) 0.4 MG SL tablet 1 TAB UNDER THE TONGUE EVERY 5 MINUTES AS NEEDED FOR CHEST PAIN UP TO 3 DOSES, IF PERSIST CALL 911 25 tablet 3   ondansetron  (ZOFRAN -ODT) 4 MG disintegrating tablet Take 1 tablet (4 mg total) by mouth every 8 (eight) hours as needed. 20 tablet 0   ONETOUCH VERIO test strip 1 each by  Other route in the morning and at bedtime. 200 each 5   oxyCODONE  (OXY IR/ROXICODONE ) 5 MG immediate release tablet Take 1 tablet (5 mg total) by mouth every 4 (four) hours as needed for severe pain (pain score 7-10). 10 tablet 0   pantoprazole  (PROTONIX ) 40 MG tablet Take 40 mg by mouth daily as needed (heartburn).     potassium chloride  SA (KLOR-CON  M) 20 MEQ tablet Take 20 mEq by mouth daily.     Semaglutide , 1 MG/DOSE, (OZEMPIC , 1 MG/DOSE,) 4 MG/3ML SOPN Inject 1 mg into the skin once a week. (Patient taking differently: Inject 1 mg into the skin once a week. On Fridays) 9 mL 3   tadalafil (CIALIS) 20 MG tablet Take 20 mg by mouth daily as needed for erectile dysfunction.     testosterone  cypionate (DEPOTESTOSTERONE CYPIONATE) 200 MG/ML injection Inject 200 mg into the muscle once a week.     traZODone  (DESYREL ) 100 MG tablet Take 100 mg by mouth at bedtime.     No current facility-administered medications for this encounter.   BP 130/70   Pulse 70   Wt 84.7 kg   SpO2 94%   BMI 26.80 kg/m    Wt Readings from Last 3 Encounters:  09/28/23 84.7 kg  09/24/23 85.3 kg  07/21/23  85.3 kg   PHYSICAL EXAM: General:  NAD. No resp difficulty, walked into clinic HEENT: Normal Neck: Supple. No JVD. Cor: Irregular rate & rhythm. No rubs, gallops or murmurs. Lungs: Clear Abdomen: Soft, nondistended. + TTP Extremities: No cyanosis, clubbing, rash, edema  Neuro: Alert & oriented x 3, moves all 4 extremities w/o difficulty. Affect pleasant.  Assessment/Plan: 1. Chronic diastolic CHF:  TEE in 8/23 with EF 55%, mild RV enlargement with normal RV function.  Echo 7/24 EF 55-60%. RV mildly reduced.  CHF is worsened by CKD stage IV and persistent AF. NYHA class III symptoms, but felt to be chronic.   - Continue lasix  120 mg bid. Labs reviewed from 09/24/23 and are stable, K 3.6, SCr 4.28 - Continue Toprol  XL 25 mg daily. 2. S/p mitral valve repair:  Echo in 8/22 showed mild-moderate stenosis with mean  gradient 8 mmHg and mild Jason.  TEE in 8/23 showed MV repair with mean gradient 10 mmHg but MVA 2.19 cm^2 (leaflets open well), trivial Jason.  Echo 7/24 mild Jason, mild MS, mean gradient 7.0   - With prosthetic material, should use antibiotic prophylaxis with dental work.  3. Atrial fibrillation: Now permanent. s/p MAZE w/ recurrence.  ERAF with back to back cardioversions in 7/24 despite amiodarone .  Saw EP, not thought to be candidate for ablation.  Therefore, amiodarone  stopped and patient started on Toprol  XL for rate control of atrial fibrillation. Fatigue improved with decreased dose of Toprol . - Continue Toprol  XL 25 mg daily for now.  - Continue apixaban  5 mg bid. No bleeding issues. 4. CAD:  He had severe stenosis in a distal D1 that is not amenable to intervention.  This has been stable on last couple of caths. He has occluded RCA and LCx known from the past with SVGs to OM and PDA.  Echo in 2/22 with EF 45-50%. Cardiolite  in 11/19 showed no ischemia. Echo in 8/22 with EF up to 55-60%, TEE in 8/23 with EF 55%. No chest pain.  - Currently not on ASA, taking apixaban .  - He cannot tolerate statins with myalgias. Continue Praluent  and Zetia .    5. Hyperlipidemia: Myalgias with Crestor , pravastatin , and atorvastatin .  We were finally able to get him Praluent  through the [place]. He is also on Zetia .  - LDL 23 on 02/15/23 - Continue Praluent  and Zetia  as above. 6. OSA: Compliant w/ CPAP.  7. CKD stage 4: AKI without recovery, uncertain etiology. He sees nephrology, there is concern that he is progressing towards HD. He now has AV fistula. - Labs reviewed from 09/24/23, Scr 4.28. 8. Diabetes: He is now followed by endocrinology. No change. 9. CVA: Thought to be atrial fibrillation-related.  Carotid dopplers 6/22 with mild stenosis only. However, he was also noted on 8/22 echo to have a PFO.  - Continue on apixaban . 10. Pre-operative cardiac risk stratification: He is anticipating gall bladder surgery in the  near-future. He is stable from CV standpoint. Revised Cardiac Risk Index Score = 5 points (15% risk of major cardiac event). CKD and OSA contribute to anesthesia risk. - Would continue beta blocker in peri-operative period.  Follow up in 3-4 months with Dr. Rolan. I asked him to call if he is scheduled for surgery, would then like to see him for a post-op appt.  Harlene Gainer, FNP-BC 09/28/23

## 2023-11-30 ENCOUNTER — Ambulatory Visit: Payer: No Typology Code available for payment source | Admitting: Internal Medicine

## 2023-12-29 ENCOUNTER — Other Ambulatory Visit (HOSPITAL_COMMUNITY): Payer: Self-pay | Admitting: Cardiology

## 2023-12-29 MED ORDER — FUROSEMIDE 40 MG PO TABS: 120.0000 mg | ORAL_TABLET | Freq: Two times a day (BID) | ORAL | 3 refills | Status: DC

## 2023-12-29 MED ORDER — EZETIMIBE 10 MG PO TABS: 10.0000 mg | ORAL_TABLET | Freq: Every day | ORAL | 3 refills | Status: DC

## 2023-12-31 ENCOUNTER — Telehealth: Payer: Self-pay

## 2023-12-31 NOTE — Telephone Encounter (Signed)
 Patient called and left a vm stating he needs a letter for the [place] statting that he is being treat here at [facility].   I called the patient back to get some clarification on what was needed and he stated to me that [facility] something here to the office for us  to fill out and I advised to him that I have not seen anything come through the fax or mail.   He says that he will bring the information to his appt for me to call them.

## 2024-01-04 ENCOUNTER — Ambulatory Visit (INDEPENDENT_AMBULATORY_CARE_PROVIDER_SITE_OTHER): Payer: No Typology Code available for payment source | Admitting: Internal Medicine

## 2024-01-04 ENCOUNTER — Encounter: Payer: Self-pay | Admitting: Internal Medicine

## 2024-01-04 VITALS — BP 120/70 | HR 75 | Ht 70.0 in | Wt 183.8 lb

## 2024-01-04 DIAGNOSIS — E1159 Type 2 diabetes mellitus with other circulatory complications: Secondary | ICD-10-CM

## 2024-01-04 DIAGNOSIS — Z7985 Long-term (current) use of injectable non-insulin antidiabetic drugs: Secondary | ICD-10-CM

## 2024-01-04 DIAGNOSIS — E782 Mixed hyperlipidemia: Secondary | ICD-10-CM

## 2024-01-04 DIAGNOSIS — E663 Overweight: Secondary | ICD-10-CM | POA: Insufficient documentation

## 2024-01-04 DIAGNOSIS — Z7984 Long term (current) use of oral hypoglycemic drugs: Secondary | ICD-10-CM

## 2024-01-04 LAB — POCT GLYCOSYLATED HEMOGLOBIN (HGB A1C): Hemoglobin A1C: 7 % — AB (ref 4.0–5.6)

## 2024-01-04 MED ORDER — ONETOUCH VERIO REFLECT W/DEVICE KIT: PACK | 0 refills | Status: AC

## 2024-01-04 NOTE — Progress Notes (Signed)
 Patient ID: [name], male   DOB: 01-11-1946, 78 y.o.   MRN: [id number]   HPI: [name] is a 78 y.o.-year-old male, initially referred by his nephrologist, Dr. 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 fib, CKD stage V, cerebrovascular disease, Aortic atherosclerosis, s/p TIA, ED).  Last visit 6 months ago.  Interim history: No blurry vision, nausea, chest pain but has chronic shortness of breath. He continues to have dietary indiscretions - mostly sweets -he eats them throughout the day. He was admitted for SBO 06/2023. On Linzess now. He has a decreased appetite.  Reviewed HbA1c levels: 08/23/2023: HbA1c 7.8% ([facility]) Lab Results  Component Value Date   HGBA1C 7.4 (A) 07/15/2023   HGBA1C 6.8 (A) 01/11/2023   HGBA1C 6.8 (A) 09/10/2022   HGBA1C 6.8 (H) 05/19/2022   HGBA1C 5.9 (A) 02/25/2022   HGBA1C 6.3 (A) 10/30/2021   HGBA1C 6.4 (A) 06/19/2021   HGBA1C 6.2 (H) 02/02/2021   HGBA1C 6.3 (H) 01/07/2021   HGBA1C 6.0 (H) 10/05/2020  09/24/2021: HbA1c 5.8% 01/11/2019: HbA1c 8.3% 10/04/2018: HbA1c 8.2%  He is currently on: - Ozempic  0.5 >> 1 mg weekly - through the [place]. - Glipizide  2.5 to 5 mg as needed before a  larger dinner. He came off metformin  due to worsening kidney function. We stopped glipizide  09/2020 due to good control.  Pt checks his sugars occasionally - a CGM was not approved for him since he was not on insulin : - am:  90-110, 120 >> 127,168 >> 105, 127-181 >> 90-110 - 2h after b'fast:  90, 110 >> n/c >> 140 >> 153 >> n/c - before lunch: n/c >> 102-130 >> n/c >> 110 - 2h after lunch: 134, 165 >> n/c >> 155 >> 133 >> n/c - before dinner: 129 >> n/c >> 86 >> n/c - 2h after dinner: n/c >> 190 >> n/c >> 110 - bedtime: n/c >> 77-122 >> n/c >> 120 >> 157 >> n/c - nighttime: n/c >> 180 (sick - steroids) Lowest sugar was 69 >> ...  115 >> 105 >>  90; he has hypoglycemia awareness in the 70s. Highest sugar was 299 >> ... 168 >> 181 (pasta the night before) >> candy, icecream: 200  Pt's meals are: - Breakfast: egg and bacon (not lately).   - Brunch: meat + veggies - Dinner: same as lunch - Snacks: crackers, popcorn   -He has stage V CKD-sees nephrology (Dr. Thornell Flirt not have to start dialysis yet but has a fistula placed; last BUN/creatinine:  Lab Results  Component Value Date   BUN 47 (H) 09/24/2023   BUN 53 (H) 07/24/2023   CREATININE 4.28 (H) 09/24/2023   CREATININE 4.06 (H) 07/24/2023  He is not on ACE inhibitor/ARB.  -+ HL; last set of lipids: Lab Results  Component Value Date   CHOL 90 02/16/2023   HDL 46 02/16/2023   LDLCALC 23 02/16/2023   TRIG 106 02/16/2023   CHOLHDL 2.0 02/16/2023  09/24/2021: 142/100/59/65 He developed generalized aches and pains from pravastatin .  He restarted Zetia  12/2021 by cardiology.  He is also on Praluent .  - last eye exam was in 12/2022: Reportedly no DR ([facility]).   - no numbness and tingling in his feet. Last foot exam 07/15/2023.  Pt has FH of DM in sister and brother.  He also has hypertension. He has a stroke 12/2020 - weak R side of body, but  improving. He was in PT. He sees neurology. He was admitted 02/01/2021 for chest pain. He had AKI. He has a history of lap band surgery 2010. Lost from 373 lbs to 215-220 lbs. He was admitted 03/2022 with ureteric stone and hydronephrosis along with AKI.  He subsequently had to have cystoscopy/ureteroscopy. 01/23/2021: B12 vitamin 733, TSH 1.83  ROS: C+ see HPI  I reviewed pt's medications, allergies, PMH, social hx, family hx, and changes were documented in the history of present illness. Otherwise, unchanged from my initial visit note.  Past Medical History:  Diagnosis Date   Allergic rhinitis    Anxiety    Atrial fibrillation (HCC)    Cancer (HCC)    hx of skin cancers    Chronic kidney disease    Coronary artery disease    a.  s/p MI & CABG; b. s/p PCI Diag;  c. Cath 12/2011: LAD diff dzs/small, Diag 75% isr, 90 dist to stent (small), LCX & RCA occluded, VG->OM 40, VG->PDA patent - med rx.   COVID-19    x 3   Depression    PTSD   Diabetes mellitus    not on medications   Diastolic CHF, chronic (HCC)    a. EF 55-60% by echo 2007   GERD (gastroesophageal reflux disease)    "not anymore" " I had a lap band"   History of diverticulitis of colon    5 YRS AGO   History of kidney stones    History of pneumonia    2017   History of transient ischemic attack (TIA)    CAROTID DOPPLERS NOV 2011  0-39& BIL. STENOSIS   Hyperlipidemia    Hypertension    Kidney failure    Mitral regurgitation    Myocardial infarction [facility])    Neuromuscular disorder (HCC)    left arm numbness    OA (osteoarthritis)    RIGHT KNEE ARTHOFIBROSIS W/ PAIN  (S/P  REPLACEMENT 2004)   Pneumonia    PTSD (post-traumatic stress disorder)    from [place] since 1973   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/25/2016   Complex valvuloplasty including artificial Gore-tex neochord placement x4, plication of anterior commissure, and 26 mm Sorin Memo 3D ring annuloplasty via right mini thoracotomy approach   Shortness of breath dyspnea    with exertion   Skin cancer    Sleep apnea    cpap- see ov note in EPIC 05/12/11 for settings    Stroke [facility])    hx of   Stroke [facility])    Past Surgical History:  Procedure Laterality Date   AV FISTULA PLACEMENT Left 08/02/2018   Procedure: ARTERIOVENOUS (AV) FISTULA CREATION RADIOCEPHALIC;  Surgeon: Dannis Dy, MD;  Location: [facility] OR;  Service: Vascular;  Laterality: Left;   BLEPHAROPLASTY  1985   BILATERAL   CARDIAC CATHETERIZATION  2001, 2004, 2009, 2011   CARDIAC CATHETERIZATION N/A 01/23/2016   Procedure: Right/Left Heart Cath and Coronary Angiography;  Surgeon: Darlis Eisenmenger, MD;  Location: [facility] INVASIVE CV LAB;  Service: Cardiovascular;  Laterality: N/A;   CARDIOVERSION N/A 09/07/2017    Procedure: CARDIOVERSION;  Surgeon: Darlis Eisenmenger, MD;  Location: [facility] ENDOSCOPY;  Service: Cardiovascular;  Laterality: N/A;   CARDIOVERSION N/A 09/13/2019   Procedure: CARDIOVERSION;  Surgeon: Darlis Eisenmenger, MD;  Location: [facility] ENDOSCOPY;  Service: Cardiovascular;  Laterality: N/A;   CARDIOVERSION N/A 10/19/2019   Procedure: CARDIOVERSION;  Surgeon: Darlis Eisenmenger, MD;  Location: [facility] ENDOSCOPY;  Service: Cardiovascular;  Laterality: N/A;  CARDIOVERSION N/A 04/14/2022   Procedure: CARDIOVERSION;  Surgeon: Darlis Eisenmenger, MD;  Location: [facility] ENDOSCOPY;  Service: Cardiovascular;  Laterality: N/A;   CARDIOVERSION N/A 02/22/2023   Procedure: CARDIOVERSION;  Surgeon: Darlis Eisenmenger, MD;  Location: [facility] INVASIVE CV LAB;  Service: Cardiovascular;  Laterality: N/A;   CARDIOVERSION N/A 03/12/2023   Procedure: CARDIOVERSION;  Surgeon: Darlis Eisenmenger, MD;  Location: [facility] INVASIVE CV LAB;  Service: Cardiovascular;  Laterality: N/A;   CATARACT EXTRACTION W/ INTRAOCULAR LENS IMPLANT Bilateral    CHONDROPLASTY  07/22/2011   Procedure: CHONDROPLASTY;  Surgeon: Aurther Blue;  Location: [facility];  Service: Orthopedics;;   CIRCUMCISION  35 YRS  AGO   COLONOSCOPY     CORONARY ANGIOPLASTY  1996-- POST CABG   W/ STENT, last cath 01/07/2012    CORONARY ARTERY BYPASS GRAFT  1995   X3 VESSEL   CORONARY ARTERY BYPASS GRAFT  1995   CORONARY STENT PLACEMENT     CYSTOSCOPY W/ URETERAL STENT PLACEMENT Right 04/11/2022   Procedure: CYSTOSCOPY WITH RETROGRADE PYELOGRAM/URETERAL STENT PLACEMENT;  Surgeon: Marco Severs, MD;  Location: [facility] ORS;  Service: Urology;  Laterality: Right;   CYSTOSCOPY/URETEROSCOPY/HOLMIUM LASER/STENT PLACEMENT Right 01/08/2021   Procedure: CYSTOSCOPY/RETROGRADE/URETEROSCOPY/HOLMIUM LASER/STENT PLACEMENT;  Surgeon: Trent Frizzle, MD;  Location: [facility] ORS;  Service: Urology;  Laterality: Right;   CYSTOSCOPY/URETEROSCOPY/HOLMIUM LASER/STENT PLACEMENT Right 05/27/2022    Procedure: CYSTOSCOPY, URETEROSCOPY, HOLMIUM LASER, STENT EXCHANGE;  Surgeon: Adelbert Homans, MD;  Location: [facility];  Service: Urology;  Laterality: Right;   INCISION AND DRAINAGE PERIRECTAL ABSCESS N/A 09/01/2021   Procedure: IRRIGATION AND DEBRIDEMENT PERIRECTAL ABSCESS;  Surgeon: Dorrie Gaudier Alphonso Aschoff, MD;  Location: [facility]S;  Service: General;  Laterality: N/A;   IR PERC TUN PERIT CATH WO PORT S&I Georjean Kite  03/06/2021   IR REMOVAL TUN CV CATH W/O FL  04/14/2021   KNEE ARTHROSCOPY  07/22/2011   Procedure: ARTHROSCOPY KNEE;  Surgeon: Aurther Blue;  Location: [facility];  Service: Orthopedics;  Laterality: Right;  WITH DEBRIDEMENT    KNEE ARTHROSCOPY W/ MENISCECTOMY  X2 IN 2002-- RIGHT KNEE   LAPAROSCOPIC GASTRIC BANDING  01-15-09   LEFT HEART CATH AND CORONARY ANGIOGRAPHY N/A 04/29/2017   Procedure: LEFT HEART CATH AND CORONARY ANGIOGRAPHY;  Surgeon: Darlis Eisenmenger, MD;  Location: [facility] INVASIVE CV LAB;  Service: Cardiovascular;  Laterality: N/A;   LEFT HEART CATHETERIZATION WITH CORONARY ANGIOGRAM N/A 09/11/2013   Procedure: LEFT HEART CATHETERIZATION WITH CORONARY ANGIOGRAM;  Surgeon: Darlis Eisenmenger, MD;  Location: [facility] CATH LAB;  Service: Cardiovascular;  Laterality: N/A;   MITRAL VALVE REPAIR Right 03/25/2016   Procedure: MINIMALLY INVASIVE REOPERATION FOR MITRAL VALVE REPAIR  (MVR) with size 26 Sorin Memo 3D;  Surgeon: Gardenia Jump, MD;  Location: [facility] OR;  Service: Open Heart Surgery;  Laterality: Right;   MITRAL VALVE REPAIR  03/2016   RIGHT KNEE ED COMPARTMENT REPLACEMENT  2004   SYNOVECTOMY  07/22/2011   Procedure: SYNOVECTOMY;  Surgeon: Henri Loft Aluisio;  Location: [facility];  Service: Orthopedics;;   TEE WITHOUT CARDIOVERSION N/A 01/23/2016   Procedure: TRANSESOPHAGEAL ECHOCARDIOGRAM (TEE);  Surgeon: Darlis Eisenmenger, MD;  Location: [facility] ENDOSCOPY;  Service: Cardiovascular;  Laterality: N/A;   TEE WITHOUT CARDIOVERSION N/A 03/25/2016   Procedure:  TRANSESOPHAGEAL ECHOCARDIOGRAM (TEE);  Surgeon: Gardenia Jump, MD;  Location: [facility] OR;  Service: Open Heart Surgery;  Laterality: N/A;   TEE WITHOUT CARDIOVERSION N/A 03/04/2021   Procedure: TRANSESOPHAGEAL ECHOCARDIOGRAM (TEE);  Surgeon:  Lenise Quince, MD;  Location: [facility] ENDOSCOPY;  Service: Cardiovascular;  Laterality: N/A;   TEE WITHOUT CARDIOVERSION N/A 04/14/2022   Procedure: TRANSESOPHAGEAL ECHOCARDIOGRAM (TEE);  Surgeon: Darlis Eisenmenger, MD;  Location: [facility] ENDOSCOPY;  Service: Cardiovascular;  Laterality: N/A;   UMBILICAL HERNIA REPAIR  01-15-09   W/ GASTRIC BANDING PROCEDURE   Social History   Socioeconomic History   Marital status: Married    Spouse name: Not on file   Number of children: 1   Years of education: Not on file   Highest education level: Not on file  Occupational History   Contractor, semi-retired  Social Needs   Financial resource strain: Not on file   Food insecurity:    Worry: Not on file    Inability: Not on file   Transportation needs:    Medical: Not on file    Non-medical: Not on file  Tobacco Use   Smoking status: Never Smoker   Smokeless tobacco: Never Used  Substance and Sexual Activity   Alcohol use: Yes, liquor    Comment: OCCASIONAL   Drug use: No   Sexual activity: Not on file  Lifestyle   Physical activity:    Days per week: Not on file    Minutes per session: Not on file   Stress: Not on file  Relationships   Social connections:    Talks on phone: Not on file    Gets together: Not on file    Attends religious service: Not on file    Active member of club or organization: Not on file    Attends meetings of clubs or organizations: Not on file    Relationship status: Not on file   Intimate partner violence:    Fear of current or ex partner: Not on file    Emotionally abused: Not on file    Physically abused: Not on file    Forced sexual activity: Not on file  Other Topics Concern   Not on file  Social History Narrative   Not  on file   Current Outpatient Medications on File Prior to Visit  Medication Sig Dispense Refill   albuterol  (VENTOLIN  HFA) 108 (90 Base) MCG/ACT inhaler Inhale 1-2 puffs into the lungs every 6 (six) hours as needed for shortness of breath or wheezing.     Alirocumab  (PRALUENT ) 75 MG/ML SOAJ Inject 75 mg into the skin every 14 (fourteen) days.     allopurinol  (ZYLOPRIM ) 100 MG tablet Take 50 mg by mouth 2 (two) times a week.     apixaban  (ELIQUIS ) 5 MG TABS tablet Take 1 tablet (5 mg total) by mouth 2 (two) times daily. 180 tablet 3   busPIRone (BUSPAR) 10 MG tablet Take 5 mg by mouth 2 (two) times daily.     cetirizine (ZYRTEC) 10 MG tablet Take 10 mg by mouth in the morning.     colchicine  0.6 MG tablet Take 0.6-1.2 mg by mouth 2 (two) times daily as needed (gout).     Cyanocobalamin (VITAMIN B-12) 5000 MCG TBDP Take 5,000 mcg by mouth 2 (two) times a week.     cycloSPORINE (RESTASIS) 0.05 % ophthalmic emulsion Place 1 drop into both eyes 2 (two) times daily as needed (eye irritation.).     diclofenac  Sodium (VOLTAREN ) 1 % GEL Apply 2 g topically 3 (three) times daily as needed for pain.     docusate sodium  (COLACE) 100 MG capsule Take 200 mg by mouth at bedtime.     ezetimibe  (ZETIA ) 10  MG tablet Take 1 tablet (10 mg total) by mouth daily. 90 tablet 3   ferrous sulfate 325 (65 FE) MG tablet Take 325 mg by mouth every other day.     fluticasone  (FLONASE ) 50 MCG/ACT nasal spray Place 1 spray into both nostrils daily as needed for allergies or rhinitis.     furosemide  (LASIX ) 40 MG tablet Take 3 tablets (120 mg total) by mouth 2 (two) times daily. Please cancel all previous orders for current medication. Change in dosage or pill size. 180 tablet 3   glipiZIDE  (GLUCOTROL ) 5 MG tablet Take 0.5-1 tablets (2.5-5 mg total) by mouth daily before supper. 60 tablet 3   HYDROcodone -acetaminophen  (NORCO) 10-325 MG tablet Take 1 tablet by mouth 3 (three) times daily as needed for moderate pain.      Meclizine HCl 25 MG CHEW Chew 25 mg by mouth 3 (three) times daily as needed (dizziness).     methocarbamol  (ROBAXIN ) 500 MG tablet Take 500 mg by mouth 3 (three) times daily as needed for muscle spasms.     metoprolol  succinate (TOPROL -XL) 50 MG 24 hr tablet Take 0.5 tablets (25 mg total) by mouth daily. Take with or immediately following a meal.     Multiple Vitamin (MULTIVITAMIN) capsule Take 1 capsule by mouth in the morning.     nitroGLYCERIN  (NITROSTAT ) 0.4 MG SL tablet 1 TAB UNDER THE TONGUE EVERY 5 MINUTES AS NEEDED FOR CHEST PAIN UP TO 3 DOSES, IF PERSIST CALL 911 25 tablet 3   ondansetron  (ZOFRAN -ODT) 4 MG disintegrating tablet Take 1 tablet (4 mg total) by mouth every 8 (eight) hours as needed. 20 tablet 0   ONETOUCH VERIO test strip 1 each by Other route in the morning and at bedtime. 200 each 5   oxyCODONE  (OXY IR/ROXICODONE ) 5 MG immediate release tablet Take 1 tablet (5 mg total) by mouth every 4 (four) hours as needed for severe pain (pain score 7-10). 10 tablet 0   pantoprazole  (PROTONIX ) 40 MG tablet Take 40 mg by mouth daily as needed (heartburn).     potassium chloride  SA (KLOR-CON  M) 20 MEQ tablet Take 20 mEq by mouth daily.     Semaglutide , 1 MG/DOSE, (OZEMPIC , 1 MG/DOSE,) 4 MG/3ML SOPN Inject 1 mg into the skin once a week. (Patient taking differently: Inject 1 mg into the skin once a week. On Fridays) 9 mL 3   tadalafil (CIALIS) 20 MG tablet Take 20 mg by mouth daily as needed for erectile dysfunction.     testosterone  cypionate (DEPOTESTOSTERONE CYPIONATE) 200 MG/ML injection Inject 200 mg into the muscle once a week.     traZODone  (DESYREL ) 100 MG tablet Take 100 mg by mouth at bedtime.     No current facility-administered medications on file prior to visit.   Allergies  Allergen Reactions   Dilaudid  [Hydromorphone  Hcl] Itching   Crestor  [Rosuvastatin ] Other (See Comments)    Muscle aches    Lipitor [Atorvastatin ] Other (See Comments)    Muscle aches   Pravastatin   Other (See Comments)    Muscle aches   Carvedilol  Other (See Comments)    loss of appetite   Metformin  Diarrhea   Serotonin Reuptake Inhibitors (Ssris) Other (See Comments)    Muscle aches   Zocor [Simvastatin] Other (See Comments)     Muscle pain   Codeine Itching and Other (See Comments)    Extremity tingling-- can take synthetic   Family History  Problem Relation Age of Onset   Other Mother  joint problems   Alzheimer's disease Mother    Hypertension Father    Heart disease Father    CVA Father        age 66   Depression Father    Heart disease Sister        CABG   Stroke Sister    Heart failure Sister        congestive   Diabetes Sister    Heart disease Brother    Hypertension Brother    Congestive Heart Failure Brother    PE: BP 120/70   Pulse 75   Ht 5\' 10"  (1.778 m)   Wt 183 lb 12.8 oz (83.4 kg)   SpO2 97%   BMI 26.37 kg/m  Wt Readings from Last 15 Encounters:  01/04/24 183 lb 12.8 oz (83.4 kg)  09/28/23 186 lb 12.8 oz (84.7 kg)  09/24/23 188 lb 0.8 oz (85.3 kg)  07/21/23 188 lb (85.3 kg)  07/15/23 192 lb 3.2 oz (87.2 kg)  06/09/23 196 lb 6.4 oz (89.1 kg)  05/21/23 199 lb 3.2 oz (90.4 kg)  04/15/23 202 lb 3.2 oz (91.7 kg)  04/08/23 203 lb 9.6 oz (92.4 kg)  03/30/23 192 lb 3.2 oz (87.2 kg)  03/12/23 192 lb (87.1 kg)  03/09/23 194 lb 9.6 oz (88.3 kg)  03/02/23 195 lb 9.6 oz (88.7 kg)  02/16/23 201 lb (91.2 kg)  01/14/23 198 lb 12.8 oz (90.2 kg)   Constitutional: overweight, in NAD Eyes: EOMI, no exophthalmos ENT: no thyromegaly, no cervical lymphadenopathy Cardiovascular: RRR, No MRG Respiratory: CTA B  Musculoskeletal: no deformities Skin: ecchymoses on bilateral dorsum of hands Neurological: + tremor with outstretched R hand  ASSESSMENT: 1. DM2, non-insulin -dependent, uncontrolled, with long-term complications - CAD, s/p coronaroplasty, then CABG 1995 - cardiologist Dr. Mitzie Anda - CHF, EF 55 to 65% - Atrial fibrillation, cardioversion  in 2019 - Carotid artery disease - Cerebrovascular disease, s/p TIA, s/p stroke 12/2020 - Ao ATS per abdominal CT (08/31/2021) - CKD stage V, pending dialysis  - ED  2. HL  3.  Overweight  PLAN:  1. Patient with longstanding, prev. Uncontrolled, DM2, with improved control in the last 4 years. He had to come off Metformin  due to decreased kidney function and he is currently on a GLP-1 receptor agonist and prn sulfonylurea.  At last visit, sugars were mostly higher than goal, probably related to dietary indiscretions (sweets) throughout the day.  I did advise him to increase the Ozempic  dose at that time to help with both blood sugars and reducing his cravings for sweets.  He was not taking a sulfonylurea at all at last visit and we discussed about have to decide when to take it.  I previously recommended a CGM and tried both Dexcom and freestyle libre 3, sent to the [place] pharmacy, but he was not able to start as the insurance did not cover this in the absence of insulin  treatment. -Latest HbA1c level was reviewed from 08/23/2023: 7.8%, higher -At today's visit, sugars are much better.  He is not taking them consistently, but whenever checked, they are at goal.  For now, will not change his regimen.  He has an appointment with gastroenterology at the end of the months.  I advised him to let me know if they recommend decrease the dose of Ozempic , in the setting of his history of SBO. - I suggested to:  Patient Instructions  Please continue: - Ozempic  1 mg weekly  - Glipizide  2.5-5 mg before a larger  dinner.  Please return in 5 months.  - we checked his HbA1c: 7.0% (lower) - advised to check sugars at different times of the day - 1x a day, rotating check times - advised for yearly eye exams >> he is UTD - return to clinic in  months  2. HL - Latest lipid panel available for review showed fractions at goal: Lab Results  Component Value Date   CHOL 90 02/16/2023   HDL 46 02/16/2023   LDLCALC  23 02/16/2023   TRIG 106 02/16/2023   CHOLHDL 2.0 02/16/2023  -He is on Zetia  10 mg daily and Praluent .  He had muscle aches from statins in the past.  3.  Overweight - h/o Obesity class I - We increased the dose of Ozempic  at last visit.  Ozempic  should also help with weight loss. - He lost 8 pounds before last visit and 9 pounds since then.  Emilie Harden, MD PhD [facility]

## 2024-01-04 NOTE — Patient Instructions (Addendum)
 Please continue: - Ozempic  1 mg weekly  - Glipizide  2.5-5 mg before a larger dinner.  Please return in 5 months.

## 2024-01-05 ENCOUNTER — Other Ambulatory Visit (HOSPITAL_COMMUNITY): Payer: Self-pay | Admitting: Cardiology

## 2024-01-05 MED ORDER — FUROSEMIDE 40 MG PO TABS: 120.0000 mg | ORAL_TABLET | Freq: Two times a day (BID) | ORAL | 0 refills | Status: DC

## 2024-01-14 ENCOUNTER — Ambulatory Visit (HOSPITAL_COMMUNITY)
Admission: RE | Admit: 2024-01-14 | Discharge: 2024-01-14 | Disposition: A | Discharge status: Home / Self Care | Source: Ambulatory Visit | Attending: Cardiology | Admitting: Cardiology

## 2024-01-14 ENCOUNTER — Ambulatory Visit (HOSPITAL_COMMUNITY): Payer: Self-pay | Admitting: Cardiology

## 2024-01-14 VITALS — BP 116/72 | HR 64 | Wt 181.2 lb

## 2024-01-14 DIAGNOSIS — Z8673 Personal history of transient ischemic attack (TIA), and cerebral infarction without residual deficits: Secondary | ICD-10-CM | POA: Diagnosis not present

## 2024-01-14 DIAGNOSIS — Z952 Presence of prosthetic heart valve: Secondary | ICD-10-CM | POA: Insufficient documentation

## 2024-01-14 DIAGNOSIS — G4733 Obstructive sleep apnea (adult) (pediatric): Secondary | ICD-10-CM | POA: Insufficient documentation

## 2024-01-14 DIAGNOSIS — I2510 Atherosclerotic heart disease of native coronary artery without angina pectoris: Secondary | ICD-10-CM | POA: Diagnosis not present

## 2024-01-14 DIAGNOSIS — Z7901 Long term (current) use of anticoagulants: Secondary | ICD-10-CM | POA: Insufficient documentation

## 2024-01-14 DIAGNOSIS — Z79899 Other long term (current) drug therapy: Secondary | ICD-10-CM | POA: Diagnosis not present

## 2024-01-14 DIAGNOSIS — E785 Hyperlipidemia, unspecified: Secondary | ICD-10-CM | POA: Diagnosis not present

## 2024-01-14 DIAGNOSIS — I130 Hypertensive heart and chronic kidney disease with heart failure and stage 1 through stage 4 chronic kidney disease, or unspecified chronic kidney disease: Secondary | ICD-10-CM | POA: Insufficient documentation

## 2024-01-14 DIAGNOSIS — Z7984 Long term (current) use of oral hypoglycemic drugs: Secondary | ICD-10-CM | POA: Diagnosis not present

## 2024-01-14 DIAGNOSIS — N184 Chronic kidney disease, stage 4 (severe): Secondary | ICD-10-CM | POA: Insufficient documentation

## 2024-01-14 DIAGNOSIS — Z951 Presence of aortocoronary bypass graft: Secondary | ICD-10-CM | POA: Insufficient documentation

## 2024-01-14 DIAGNOSIS — E669 Obesity, unspecified: Secondary | ICD-10-CM | POA: Insufficient documentation

## 2024-01-14 DIAGNOSIS — E1122 Type 2 diabetes mellitus with diabetic chronic kidney disease: Secondary | ICD-10-CM | POA: Diagnosis not present

## 2024-01-14 DIAGNOSIS — I4821 Permanent atrial fibrillation: Secondary | ICD-10-CM | POA: Insufficient documentation

## 2024-01-14 DIAGNOSIS — I5032 Chronic diastolic (congestive) heart failure: Secondary | ICD-10-CM | POA: Diagnosis not present

## 2024-01-14 DIAGNOSIS — Z9884 Bariatric surgery status: Secondary | ICD-10-CM | POA: Diagnosis not present

## 2024-01-14 LAB — LIPID PANEL
Cholesterol: 72 mg/dL (ref 0–200)
HDL: 39 mg/dL — ABNORMAL LOW (ref >40)
LDL Cholesterol: 20 mg/dL (ref 0–99)
Total CHOL/HDL Ratio: 1.8 ratio
Triglycerides: 64 mg/dL (ref <150)
VLDL: 13 mg/dL (ref 0–40)

## 2024-01-14 LAB — BASIC METABOLIC PANEL WITH GFR
Anion gap: 12 (ref 5–15)
BUN: 46 mg/dL — ABNORMAL HIGH (ref 8–23)
CO2: 23 mmol/L (ref 22–32)
Calcium: 9.5 mg/dL (ref 8.9–10.3)
Chloride: 101 mmol/L (ref 98–111)
Creatinine, Ser: 4.22 mg/dL — ABNORMAL HIGH (ref 0.61–1.24)
GFR, Estimated: 14 mL/min — ABNORMAL LOW (ref >60)
Glucose, Bld: 143 mg/dL — ABNORMAL HIGH (ref 70–99)
Potassium: 3.5 mmol/L (ref 3.5–5.1)
Sodium: 136 mmol/L (ref 135–145)

## 2024-01-14 NOTE — Patient Instructions (Signed)
 STOP your Toprol  XL if you don't fee; better in 2 weeks.  Labs done today, your results will be available in MyChart, we will contact you for abnormal readings.  Your physician has requested that you have an echocardiogram. Echocardiography is a painless test that uses sound waves to create images of your heart. It provides your doctor with information about the size and shape of your heart and how well your heart's chambers and valves are working. This procedure takes approximately one hour. There are no restrictions for this procedure. Please do NOT wear cologne, perfume, aftershave, or lotions (deodorant is allowed). Please arrive 15 minutes prior to your appointment time.  Please note: We ask at that you not bring children with you during ultrasound (echo/ vascular) testing. Due to room size and safety concerns, children are not allowed in the ultrasound rooms during exams. Our front office staff cannot provide observation of children in our lobby area while testing is being conducted. An adult accompanying a patient to their appointment will only be allowed in the ultrasound room at the discretion of the ultrasound technician under special circumstances. We apologize for any inconvenience.  Your physician recommends that you schedule a follow-up appointment in: 6 months.  If you have any questions or concerns before your next appointment please send us  a message through [place] or call our office at [phone].    TO LEAVE A MESSAGE FOR THE NURSE SELECT OPTION 2, PLEASE LEAVE A MESSAGE INCLUDING: YOUR NAME DATE OF BIRTH CALL BACK NUMBER REASON FOR CALL**this is important as we prioritize the call backs  YOU WILL RECEIVE A CALL BACK THE SAME DAY AS LONG AS YOU CALL BEFORE 4:00 PM  At the [facility], you and your health needs are our priority. As part of our continuing mission to provide you with exceptional heart care, we have created designated Provider Care Teams.  These Care Teams include your primary Cardiologist (physician) and Advanced Practice Providers (APPs- Physician Assistants and Nurse Practitioners) who all work together to provide you with the care you need, when you need it.   You may see any of the following providers on your designated Care Team at your next follow up: Dr Jules Oar Dr Peder Bourdon Dr. Alwin Baars Dr. Arta Lark Amy Marijane Shoulders, NP Ruddy Corral, [place] [facility] [place], [place] Dennise Fitz, NP [place] Lee, NP Shawnee Dellen, NP Luster Salters, PharmD Bevely Brush, PharmD   Please be sure to bring in all your medications bottles to every appointment.    Thank you for choosing [facility]

## 2024-01-15 NOTE — Progress Notes (Signed)
 Patient ID: [name], male   DOB: [birth year], 78 y.o.   MRN: [id number] PCP: Dr Avanell Bob Cardiology: Dr. Mitzie Anda Nephrology: Dr Higinio Love  EP: Dr Arlester Ladd  Chief complaint: CHF  [name] is a 78 y.o. with history of CAD s/p CABG and obesity s/p lap banding procedure/  Given exertional dyspnea and chest tightness, he had LHC in 1/15.  This showed no change from prior.  Grafts were patent and there was severe stenosis in distal D1, not amenable to PCI.   In 2017, he developed increased exertional dyspnea.  Eventually had a TEE showing severe MR with prolapse of a portion of the anterior mitral valve leaflet.  Coronary angiography in 6/17 showed patent grafts and 90% stenosis of distal diagonal not amenable to intervention.  In 8/17, he had mitral valve repair.  Post-op diastolic CHF, Lasix  started and increased.   He had a cath again in 9/18 showing stable coronary anatomy.    He developed atrial arrhythmias in 12/18.  Both fibrillation and flutter were seen.  Saw Dr. Nunzio Belch, recommended DCCV with amiodarone  or sotalol if fibrillation recurs.  He had DCCV in 1/19.   In 6/19, he was noted to have developed AKI. Creatinine went up to 3.46.  Renal US  was done, showing normal kidneys. Cause of AKI was not clear.  We stopped his Lasix . He saw nephrology and eventually went back on Lasix , currently taking 80 mg daily.  He is not using any NSAIDs.    He stopped pravastatin  due to myalgias. We struggled to get him on a PCSK9 inhibitor through the [place] but he was finally able to get Praluent .    He had a Lexiscan  Cardiolite  in 11/19, which showed old inferior MI with no ischemia.  Echo in 11/19 showed EF 55-60%, s/p MV repair with mean gradient 6 mmHg. Echo 12/20 showed EF 50-55%, mildly decreased RV systolic function, repaired mitral valve with mild MR, mean gradient 6 mmHg across MV.    He had DCCV from atrial fibrillation to NSR in 1/21 and again in 2/21.   At last appointment, he reported significant  fatigue and dyspnea in setting of weight loss with low BP and HR.  I stopped his Coreg . He felt better with that change.    In 9/21, he had COVID-19 infection but was not hospitalized.   Zio patch in 3/22 showed frequent short atrial tachycardia episodes.  He saw Dr. Nunzio Belch, medical therapy recommended (on amiodarone ).   He was admitted in 5/22 with AKI from obstruction of right ureter by stone, he was treated by urology.   He was admitted in 6/22 with right-sided weakness and was found to have left cerebral white matter CVA.  He had been compliant with Eliquis , ASA 81 was added.   He was admitted in 7/22 with Enterococcal bacteremia.  TEE showed oscillating density attached to the mitral annulus, ?suture.  However, could not rule out vegetation so had 6 wks of IV abx for SBE.   Echo in 8/22 showed EF 55-60%, moderate focal basal septal hypertrophy, normal RV, severe LAE, s/p mitral valve repair with mean gradient 8 mmHg (mild to moderate mitral stenosis) and mild MR, large PFO, normal IVC.   Patient was noted to re-develop atrial fibrillation.  TEE was done in 8/23, LV EF was 55% with mild RV enlargement and normal RV function, s/p MV repair with mean gradient 10 mmHg but MVA 2.19 cm^2 (leaflets open well), trivial MR.  He was cardioverted back to NSR.  Follow up 6/24, he was in rate controlled AF, for about a month. Zio monitor in 6/24 showed continuous AF. Endorsed worsening NYHA III symptoms and volume overloaded. Lasix  increased to 80/40. Arranged for DCCV on 02/22/23, successful to NSR.  Unfortunately, at return f/u he was back in AF and felt poor.  More fatigued and worn out but not markedly short of breath.  ReDs 33%. He was arranged for repeat DCCV, done on 03/12/23. Converted back to NSR. He again went back into AF despite amiodarone  use.   Seen by EP, not candidate for ablation, though AF was now permanent and stopped amiodarone /started Toprol  XL.   Today he returns for HF follow up.  Main complaint is intermittent abdominal pain with recurrent partial SBO.  He has generalized fatigue, does not think that cutting down on Toprol  XL helped much with this.  He is mildly lightheaded with standing.  Weight down 5 lbs.  No chest pain.  No dyspnea walking on flat ground though he gets short of breath with inclines. Mood is depressed.   ECG (personally reviewed): atrial fibrillation, RBBB, LAFB  Labs (4/24): K 3.3, creatinine 4.22, LFTs normal, TSH normal Labs (6/24): K 4.0, creatinine 4.32, LFTs normal, TSH normal, LDL 23, HDL 46 Labs (7/24): K 3.5, creatinine 4.42 Labs (04/08/23): K 3.5 Creatinine 4.27, BNP 294 Labs (1/25): K 3.6, creatinine 4.28  PMH: 1. OSA: on CPAP.  2. Diabetes mellitus 3. Allergic rhinitis 4. CAD: s/p CABG 1995.  LHC (10/11) with 90% distal D1 (patent proximal D1 stent), total occlusion CFX, total occlusion RCA, no significant stenoses in LAD, SVG-PDA patent, SVG-OM patent, EF 55%.  Medical management.  LHC (5/13) with patent grafts and 90% distal D1 stenosis (no significant change from 10/11).  LHC (1/15) with patent grafts, 75% ISR in D1 stent, 90% stenosis distal D1 (small caliber). D1 not amenable to intervention.  - Coronary angiography (6/17): grafts patent, 90% stenosis distally in large diagonal not amenable to intervention.   - LHC (9/18): SVG-OM and SVG-PDA patent,  75% in-stent restenosis in proximal diagonal then 90% stenosis distally (small in caliber at that point), totally occluded LCx and RCA.  Left coronary system similar to prior caths, D1 may be source of angina.  - Lexiscan  Cardiolite  (11/19): EF 47%, old inferior MI, no ischemia.  5. Obesity: lap-banding in 5/11.  6. Chronic diastolic CHF: Echo (8/17) with EF 55-60%, moderate LVH, moderate diastolic dysfunction, s/p mitral valve repair with elevated mean gradient but normal pressure half-time.  - Echo (8/18): EF 55-60%, s/p MV repair with mean gradient 6 mmHg, PASP 28 mmHg, mildly decreased  RV systolic function.  - Echo (11/19): EF 55-60%, s/p MV repair with mean gradient 6, PASP 42 mmHg, mildly dilated RV, dilated IVC.  - Echo (12/20): EF 50-55%, mildly decreased RV systolic function. S/p MV repair with mean gradient 6 mmHg, mild MR.  - Echo (2/22): EF 45-50%, mild LVH, moderate RV enlargement with low normal systolic function, s/p mitral valve repair with mean gradient 5 mmHg and trivial MR, mild-moderate AS.  - Echo (8/22): EF 55-60%, moderate focal basal septal hypertrophy, normal RV, severe LAE, s/p mitral valve repair with mean gradient 8 mmHg (mild to moderate mitral stenosis) and mild MR, large PFO, normal IVC. - TEE (8/23): LV EF was 55% with mild RV enlargement and normal RV function, s/p MV repair with mean gradient 10 mmHg but MVA 2.19 cm^2 (leaflets open well), trivial MR. 7. PFO: noted on 8/22 echo.  8.  OA: Knees, C-spine. TKR 9/13.  9. HTN 10. Hyperlipidemia: Myalgias with atorvastatin  and Crestor . Now on Praluent .  11. Diverticulosis 12. Mitral regurgitation: TEE (6/17) with EF 55-60%, severe eccentric MR, prolapse of a portion of the anterior leaflet, peak RV-RA gradient 45 mmHg.  - Mitral valve repair 8/17 - Echo in 8/22 with repaired MV, mean gradient 8 mmHg (mild-moderate MS), mild MR.  - TEE in 8/23 with MV repair with mean gradient 10 mmHg but MVA 2.19 cm^2 (leaflets open well), trivial MR. 13. CKD: Stage III.  14. Atrial fibrillation/atrial flutter: Both arrhythmias noted in 12/18.  DCCV 12/19.   - DCCV to NSR 1/21.  - DCCV to NSR 2/21.  - DCCV to NSR in 8/23 - DCCV to NSR 7/24 - Now permanent 15. AKI 6/19 => CKD stage 4: uncertain etiology.  Renal US  was normal.  16. Gout 17. COVID-19 infection 9/21 18. 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  - Carotid dopplers (6/22) with mild disease only.  19. Nephrolithiasis.  20. Atrial tachycardia: Noted on 3/22 Zio patch.  21. Aortic stenosis: mild-moderate on 2/22 echo but  no significant stenosis on 8/22 echo.  - No AS on TEE in 8/23.  22. 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  TEE 7/22 with oscillating density attached to the mitral annulus, ?suture.  However, could not rule out vegetation so had 6 wks of IV abx for SBE. 23. 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  Lives in [place].   Family History  Problem Relation Age of Onset   Other Mother        joint problems   Alzheimer's disease Mother    Hypertension Father    Heart disease Father    CVA Father        age 53   Depression Father    Heart disease Sister        CABG   Stroke Sister    Heart failure Sister        congestive   Diabetes Sister    Heart disease Brother    Hypertension Brother    Congestive Heart Failure Brother     ROS: All systems reviewed and negative except as per HPI.   Current Outpatient Medications  Medication Sig Dispense Refill   albuterol  (VENTOLIN  HFA) 108 (90 Base) MCG/ACT inhaler Inhale 1-2 puffs into the lungs every 6 (six) hours as needed for shortness of breath or wheezing.     Alirocumab  (PRALUENT ) 75 MG/ML SOAJ Inject 75 mg into the skin every 14 (fourteen) days.     allopurinol  (ZYLOPRIM ) 100 MG tablet Take 50 mg by mouth 2 (two) times a week.     apixaban  (ELIQUIS ) 5 MG TABS tablet Take 1 tablet (5 mg total) by mouth 2 (two) times daily. 180 tablet 3   Blood Glucose Monitoring Suppl (ONETOUCH VERIO REFLECT) w/Device KIT Use as advised 1 kit 0   busPIRone (BUSPAR) 10 MG tablet Take 5 mg by mouth 2 (two) times daily.     cetirizine (ZYRTEC) 10 MG tablet Take 10 mg by mouth in the morning.     colchicine  0.6 MG tablet Take 0.6-1.2 mg by mouth 2 (two) times daily as needed (gout).     Cyanocobalamin (VITAMIN B-12) 5000 MCG TBDP Take 5,000 mcg by mouth 2 (two) times a week.     cycloSPORINE (RESTASIS) 0.05 % ophthalmic emulsion Place 1 drop into both eyes 2 (two) times daily as needed (eye  irritation.).     diclofenac   Sodium (VOLTAREN ) 1 % GEL Apply 2 g topically 3 (three) times daily as needed for pain.     docusate sodium  (COLACE) 100 MG capsule Take 200 mg by mouth at bedtime.     ezetimibe  (ZETIA ) 10 MG tablet Take 1 tablet (10 mg total) by mouth daily. 90 tablet 3   fluticasone  (FLONASE ) 50 MCG/ACT nasal spray Place 1 spray into both nostrils daily as needed for allergies or rhinitis.     furosemide  (LASIX ) 40 MG tablet Take 3 tablets (120 mg total) by mouth 2 (two) times daily. Please cancel all previous orders for current medication. Change in dosage or pill size. 180 tablet 0   glipiZIDE  (GLUCOTROL ) 5 MG tablet Take 0.5-1 tablets (2.5-5 mg total) by mouth daily before supper. (Patient taking differently: Take 2.5-5 mg by mouth daily before supper. As needed) 60 tablet 3   HYDROcodone -acetaminophen  (NORCO) 10-325 MG tablet Take 1 tablet by mouth 3 (three) times daily as needed for moderate pain.     Meclizine HCl 25 MG CHEW Chew 25 mg by mouth 3 (three) times daily as needed (dizziness).     methocarbamol  (ROBAXIN ) 500 MG tablet Take 500 mg by mouth 3 (three) times daily as needed for muscle spasms.     metoprolol  succinate (TOPROL -XL) 50 MG 24 hr tablet Take 0.5 tablets (25 mg total) by mouth daily. Take with or immediately following a meal.     nitroGLYCERIN  (NITROSTAT ) 0.4 MG SL tablet 1 TAB UNDER THE TONGUE EVERY 5 MINUTES AS NEEDED FOR CHEST PAIN UP TO 3 DOSES, IF PERSIST CALL 911 25 tablet 3   ondansetron  (ZOFRAN -ODT) 4 MG disintegrating tablet Take 1 tablet (4 mg total) by mouth every 8 (eight) hours as needed. 20 tablet 0   ONETOUCH VERIO test strip 1 each by Other route in the morning and at bedtime. 200 each 5   pantoprazole  (PROTONIX ) 40 MG tablet Take 40 mg by mouth daily as needed (heartburn).     potassium chloride  SA (KLOR-CON  M) 20 MEQ tablet Take 20 mEq by mouth daily.     Semaglutide , 1 MG/DOSE, (OZEMPIC , 1 MG/DOSE,) 4 MG/3ML SOPN Inject 1 mg into the skin once a week. (Patient taking  differently: Inject 1 mg into the skin once a week. On Fridays) 9 mL 3   tadalafil (CIALIS) 20 MG tablet Take 20 mg by mouth daily as needed for erectile dysfunction.     testosterone  cypionate (DEPOTESTOSTERONE CYPIONATE) 200 MG/ML injection Inject 200 mg into the muscle once a week.     traZODone  (DESYREL ) 100 MG tablet Take 100 mg by mouth at bedtime.     No current facility-administered medications for this encounter.   BP 116/72   Pulse 64   Wt 82.2 kg (181 lb 3.2 oz)   SpO2 98%   BMI 26.00 kg/m    Wt Readings from Last 3 Encounters:  01/14/24 82.2 kg (181 lb 3.2 oz)  01/04/24 83.4 kg (183 lb 12.8 oz)  09/28/23 84.7 kg (186 lb 12.8 oz)   PHYSICAL EXAM: General: NAD Neck: No JVD, no thyromegaly or thyroid  nodule.  Lungs: Clear to auscultation bilaterally with normal respiratory effort. CV: Nondisplaced PMI.  Heart irregular S1/S2, no S3/S4, no murmur.  No peripheral edema.  No carotid bruit.  Normal pedal pulses.  Abdomen: Soft, nontender, no hepatosplenomegaly, no distention.  Skin: Intact without lesions or rashes.  Neurologic: Alert and oriented x 3.  Psych: Normal affect. Extremities: No  clubbing or cyanosis.  HEENT: Normal.   Assessment/Plan: 1. Chronic diastolic CHF:  TEE in 8/23 with EF 55%, mild RV enlargement with normal RV function.  Echo 7/24 EF 55-60%. RV mildly reduced.  CHF is worsened by CKD stage IV and persistent AF. NYHA class III primarily due to fatigue.  He does not look significantly volume overloaded.    - Continue Lasix  120 mg qam/80 qpm.  BMET/BNP today.   2. S/p mitral valve repair:  Echo in 8/22 showed mild-moderate stenosis with mean gradient 8 mmHg and mild MR.  TEE in 8/23 showed MV repair with mean gradient 10 mmHg but MVA 2.19 cm^2 (leaflets open well), trivial MR.  Echo 7/24 mild MR, mild MS, mean gradient 7.0.  - With prosthetic material, should use antibiotic prophylaxis with dental work.  - Repeat echo in 7/25.  3. Atrial fibrillation: Now  permanent. s/p MAZE w/ recurrence.  ERAF with back to back cardioversions in 7/24 despite amiodarone .  Saw EP, not thought to be candidate for ablation.  Therefore, amiodarone  stopped and patient started on Toprol  XL for rate control of atrial fibrillation.  Patient has felt marked fatigue since starting Toprol  XL. HR is in the 60s.  - He is going to go ahead and stop Toprol  XL to see if this helps with his fatigue.  - Continue apixaban  5 mg bid.  4. CAD:  He had severe stenosis in a distal D1 that is not amenable to intervention.  This has been stable on last couple of caths. He has occluded RCA and LCx known from the past with SVGs to OM and PDA.  Echo in 2/22 with EF 45-50%. Cardiolite  in 11/19 showed no ischemia. Echo in 8/22 with EF up to 55-60%, TEE in 8/23 with EF 55%.  No chest pain.  - Currently not on ASA, taking apixaban .  - He cannot tolerate statins with myalgias, Now on Praluent .    5. Hyperlipidemia: Myalgias with Crestor , pravastatin , and atorvastatin .  We were finally able to get him Praluent  through the [place]. He is also on Zetia .  - Check lipids today.  6. OSA: Compliant w/ CPAP.  7. CKD stage 4: AKI without recovery, uncertain etiology.  He sees nephrology, there is concern that he is progressing towards HD. He now has AV fistula.  - BMET today.  8. Diabetes: He is now followed by endocrinology. 9. CVA: Though to be atrial fibrillation-related.  Carotid dopplers 6/22 with mild stenosis only. However, he was also noted on 8/22 echo to have a PFO.  - Continue on Eliquis .  10. Enterococcal bacteremia: In 7/22.  On TEE, it was hard to differentiate between loose suture material and small vegetation.  He was treated for 6 wks with IV abx.   Follow up in 6 months with [facility]  I spent 31 minutes reviewing records, interviewing/examining patient, and managing orders.   Peder Bourdon  01/15/2024

## 2024-01-25 ENCOUNTER — Other Ambulatory Visit (HOSPITAL_COMMUNITY): Payer: Self-pay | Admitting: Cardiology

## 2024-01-26 MED ORDER — FUROSEMIDE 40 MG PO TABS: 120.0000 mg | ORAL_TABLET | Freq: Two times a day (BID) | ORAL | 6 refills | Status: DC

## 2024-01-26 MED ORDER — EZETIMIBE 10 MG PO TABS: 10.0000 mg | ORAL_TABLET | Freq: Every day | ORAL | 3 refills | Status: DC

## 2024-01-26 MED ORDER — EZETIMIBE 10 MG PO TABS: 10.0000 mg | ORAL_TABLET | Freq: Every day | ORAL | 0 refills | Status: DC

## 2024-01-27 ENCOUNTER — Ambulatory Visit: Payer: No Typology Code available for payment source | Admitting: Internal Medicine

## 2024-02-17 ENCOUNTER — Other Ambulatory Visit (HOSPITAL_COMMUNITY): Payer: Self-pay | Admitting: Cardiology

## 2024-02-17 MED ORDER — EZETIMIBE 10 MG PO TABS: 10.0000 mg | ORAL_TABLET | Freq: Every day | ORAL | 3 refills | Status: AC

## 2024-02-17 MED ORDER — FUROSEMIDE 40 MG PO TABS: 120.0000 mg | ORAL_TABLET | Freq: Two times a day (BID) | ORAL | 6 refills | Status: DC

## 2024-03-09 ENCOUNTER — Ambulatory Visit (HOSPITAL_COMMUNITY)
Admission: RE | Admit: 2024-03-09 | Discharge: 2024-03-09 | Disposition: A | Discharge status: Home / Self Care | Source: Ambulatory Visit | Attending: Cardiology | Admitting: Cardiology

## 2024-03-09 DIAGNOSIS — I5032 Chronic diastolic (congestive) heart failure: Secondary | ICD-10-CM | POA: Diagnosis present

## 2024-03-09 DIAGNOSIS — I082 Rheumatic disorders of both aortic and tricuspid valves: Secondary | ICD-10-CM | POA: Diagnosis not present

## 2024-03-09 DIAGNOSIS — I371 Nonrheumatic pulmonary valve insufficiency: Secondary | ICD-10-CM | POA: Insufficient documentation

## 2024-03-09 DIAGNOSIS — N189 Chronic kidney disease, unspecified: Secondary | ICD-10-CM | POA: Insufficient documentation

## 2024-03-09 DIAGNOSIS — I130 Hypertensive heart and chronic kidney disease with heart failure and stage 1 through stage 4 chronic kidney disease, or unspecified chronic kidney disease: Secondary | ICD-10-CM | POA: Insufficient documentation

## 2024-03-09 LAB — ECHOCARDIOGRAM COMPLETE
MV VTI: 1.72 cm2
S' Lateral: 3.6 cm

## 2024-03-09 NOTE — Progress Notes (Signed)
  Echocardiogram 2D Echocardiogram has been performed.  Lacinda Curvin 03/09/2024, 11:55 AM

## 2024-03-13 NOTE — Telephone Encounter (Signed)
 Called patient's wife per Dr. Rolan with echo results and scheduled appointment in [facility] as instructed. Wife verbalized understanding of same.   He has evidence for volume overload on echo with dilated IVC. Would get appt with [facility], probably need to increase his Lasix .

## 2024-03-21 NOTE — Progress Notes (Signed)
 [name] ID: [name], male   DOB: 06-25-1946, 78 y.o.   MRN: [id number] PCP: Dr Okey Cardiology: Dr. Rolan Nephrology: Dr. Norine  EP: Dr. Nancey  [name] is a 78 y.o. with history of CAD s/p CABG and obesity s/p lap banding procedure/  Given exertional dyspnea and chest tightness, he had LHC in 1/15.  This showed no change from prior.  Grafts were patent and there was severe stenosis in distal D1, not amenable to PCI.   In 2017, he developed increased exertional dyspnea.  Eventually had a TEE showing severe Jason with prolapse of a portion of the anterior mitral valve leaflet.  Coronary angiography in 6/17 showed patent grafts and 90% stenosis of distal diagonal not amenable to intervention.  In 8/17, he had mitral valve repair.  Post-op diastolic CHF, Lasix  started and increased.   He had a cath again in 9/18 showing stable coronary anatomy.    He developed atrial arrhythmias in 12/18.  Both fibrillation and flutter were seen.  Saw Dr. Kelsie, recommended DCCV with amiodarone  or sotalol if fibrillation recurs.  He had DCCV in 1/19.   In 6/19, he was noted to have developed AKI. Creatinine went up to 3.46.  Renal US  was done, showing normal kidneys. Cause of AKI was not clear.  We stopped his Lasix . He saw nephrology and eventually went back on Lasix , currently taking 80 mg daily.  He is not using any NSAIDs.    He stopped pravastatin  due to myalgias. We struggled to get him on a PCSK9 inhibitor through the [facility] but he was finally able to get Praluent .    He had a Lexiscan  Cardiolite  in 11/19, which showed old inferior MI with no ischemia.  Echo in 11/19 showed EF 55-60%, s/p MV repair with mean gradient 6 mmHg. Echo 12/20 showed EF 50-55%, mildly decreased RV systolic function, repaired mitral valve with mild Jason, mean gradient 6 mmHg across MV.    He had DCCV from atrial fibrillation to NSR in 1/21 and again in 2/21.   At last appointment, he reported significant fatigue and dyspnea in  setting of weight loss with low BP and HR.  I stopped his Coreg . He felt better with that change.    In 9/21, he had COVID-19 infection but was not hospitalized.   Zio patch in 3/22 showed frequent short atrial tachycardia episodes.  He saw Dr. Kelsie, medical therapy recommended (on amiodarone ).   He was admitted in 5/22 with AKI from obstruction of right ureter by stone, he was treated by urology.   He was admitted in 6/22 with right-sided weakness and was found to have left cerebral white matter CVA.  He had been compliant with Eliquis , ASA 81 was added.   He was admitted in 7/22 with Enterococcal bacteremia.  TEE showed oscillating density attached to the mitral annulus, ?suture.  However, could not rule out vegetation so had 6 wks of IV abx for SBE.   Echo in 8/22 showed EF 55-60%, moderate focal basal septal hypertrophy, normal RV, severe LAE, s/p mitral valve repair with mean gradient 8 mmHg (mild to moderate mitral stenosis) and mild Jason, large PFO, normal IVC.   [name] was noted to re-develop atrial fibrillation.  TEE was done in 8/23, LV EF was 55% with mild RV enlargement and normal RV function, s/p MV repair with mean gradient 10 mmHg but MVA 2.19 cm^2 (leaflets open well), trivial Jason.  He was cardioverted back to NSR.   Follow up  6/24, he was in rate controlled AF, for about a month. Zio monitor in 6/24 showed continuous AF. Endorsed worsening NYHA III symptoms and volume overloaded. Lasix  increased to 80/40. Arranged for DCCV on 02/22/23, successful to NSR.  Unfortunately, at return f/u he was back in AF and felt poor.  More fatigued and worn out but not markedly short of breath.  ReDs 33%. He was arranged for repeat DCCV, done on 03/12/23. Converted back to NSR. He again went back into AF despite amiodarone  use.   Seen by EP, not candidate for ablation, though AF was now permanent and stopped amiodarone /started Toprol  XL.   Echo 7/25 showed EF 55-60%, RV moderately reduced, normal  prosthetic mitral valve, IVC dilated.  Today he returns for HF follow up. Overall feeling fine. Continues to struggle with abdominal pain related to his SBO, treating with laxatives. Feels like he may have fluid on board as weight up a bit. He has dyspnea with activity, this is his baseline. Occasional dizziness, no falls or syncope. Denies palpitations, abnormal bleeding, CP, edema, or PND/Orthopnea. Appetite ok. Weight at home 180 pounds. Taking all medications. Enjoys fishing.  ReDs reading: 27 %, normal  ECG (personally reviewed): none ordered today.  Labs (4/24): K 3.3, creatinine 4.22, LFTs normal, TSH normal Labs (6/24): K 4.0, creatinine 4.32, LFTs normal, TSH normal, LDL 23, HDL 46 Labs (7/24): K 3.5, creatinine 4.42 Labs (04/08/23): K 3.5, creatinine 4.27, BNP 294 Labs (1/25): K 3.6, creatinine 4.28 Labs (5/25): K 3.5, creatinine 4.22, LDL 20  PMH: 1. OSA: on CPAP.  2. Diabetes mellitus 3. Allergic rhinitis 4. CAD: s/p CABG 1995.  LHC (10/11) with 90% distal D1 (patent proximal D1 stent), total occlusion CFX, total occlusion RCA, no significant stenoses in LAD, SVG-PDA patent, SVG-OM patent, EF 55%.  Medical management.  LHC (5/13) with patent grafts and 90% distal D1 stenosis (no significant change from 10/11).  LHC (1/15) with patent grafts, 75% ISR in D1 stent, 90% stenosis distal D1 (small caliber). D1 not amenable to intervention.  - Coronary angiography (6/17): grafts patent, 90% stenosis distally in large diagonal not amenable to intervention.   - LHC (9/18): SVG-OM and SVG-PDA patent,  75% in-stent restenosis in proximal diagonal then 90% stenosis distally (small in caliber at that point), totally occluded LCx and RCA.  Left coronary system similar to prior caths, D1 may be source of angina.  - Lexiscan  Cardiolite  (11/19): EF 47%, old inferior MI, no ischemia.  5. Obesity: lap-banding in 5/11.  6. Chronic diastolic CHF: Echo (8/17) with EF 55-60%, moderate LVH, moderate  diastolic dysfunction, s/p mitral valve repair with elevated mean gradient but normal pressure half-time.  - Echo (8/18): EF 55-60%, s/p MV repair with mean gradient 6 mmHg, PASP 28 mmHg, mildly decreased RV systolic function.  - Echo (11/19): EF 55-60%, s/p MV repair with mean gradient 6, PASP 42 mmHg, mildly dilated RV, dilated IVC.  - Echo (12/20): EF 50-55%, mildly decreased RV systolic function. S/p MV repair with mean gradient 6 mmHg, mild Jason.  - Echo (2/22): EF 45-50%, mild LVH, moderate RV enlargement with low normal systolic function, s/p mitral valve repair with mean gradient 5 mmHg and trivial Jason, mild-moderate AS.  - Echo (8/22): EF 55-60%, moderate focal basal septal hypertrophy, normal RV, severe LAE, s/p mitral valve repair with mean gradient 8 mmHg (mild to moderate mitral stenosis) and mild Jason, large PFO, normal IVC. - TEE (8/23): LV EF was 55% with mild RV enlargement and normal  RV function, s/p MV repair with mean gradient 10 mmHg but MVA 2.19 cm^2 (leaflets open well), trivial Jason. - Echo (7/25): EF 55-60%, RV moderately reduced, normal prosthetic mitral valve, IVC dilated. 7. PFO: noted on 8/22 echo.  8. OA: Knees, C-spine. TKR 9/13.  9. HTN 10. Hyperlipidemia: Myalgias with atorvastatin  and Crestor . Now on Praluent .  11. Diverticulosis 12. Mitral regurgitation: TEE (6/17) with EF 55-60%, severe eccentric Jason, prolapse of a portion of the anterior leaflet, peak RV-RA gradient 45 mmHg.  - Mitral valve repair 8/17 - Echo in 8/22 with repaired MV, mean gradient 8 mmHg (mild-moderate MS), mild Jason.  - TEE in 8/23 with MV repair with mean gradient 10 mmHg but MVA 2.19 cm^2 (leaflets open well), trivial Jason. 13. CKD: Stage III.  14. Atrial fibrillation/atrial flutter: Both arrhythmias noted in 12/18.  DCCV 12/19.   - DCCV to NSR 1/21.  - DCCV to NSR 2/21.  - DCCV to NSR in 8/23 - DCCV to NSR 7/24 - Now permanent 15. AKI 6/19 => CKD stage 4: uncertain etiology.  Renal US  was normal.   16. Gout 17. COVID-19 infection 9/21 18. 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  - Carotid dopplers (6/22) with mild disease only.  19. Nephrolithiasis.  20. Atrial tachycardia: Noted on 3/22 Zio patch.  21. Aortic stenosis: mild-moderate on 2/22 echo but no significant stenosis on 8/22 echo.  - No AS on TEE in 8/23.  22. 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  TEE 7/22 with oscillating density attached to the mitral annulus, ?suture.  However, could not rule out vegetation so had 6 wks of IV abx for SBE. 23. 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  Lives in [place].   Family History  Problem Relation Age of Onset   Other Mother        joint problems   Alzheimer's disease Mother    Hypertension Father    Heart disease Father    CVA Father        age 57   Depression Father    Heart disease Sister        CABG   Stroke Sister    Heart failure Sister        congestive   Diabetes Sister    Heart disease Brother    Hypertension Brother    Congestive Heart Failure Brother     ROS: All systems reviewed and negative except as per HPI.   Current Outpatient Medications  Medication Sig Dispense Refill   albuterol  (VENTOLIN  HFA) 108 (90 Base) MCG/ACT inhaler Inhale 1-2 puffs into the lungs every 6 (six) hours as needed for shortness of breath or wheezing.     Alirocumab  (PRALUENT ) 75 MG/ML SOAJ Inject 75 mg into the skin every 14 (fourteen) days.     allopurinol  (ZYLOPRIM ) 100 MG tablet Take 50 mg by mouth 2 (two) times a week.     apixaban  (ELIQUIS ) 5 MG TABS tablet Take 1 tablet (5 mg total) by mouth 2 (two) times daily. 180 tablet 3   Blood Glucose Monitoring Suppl (ONETOUCH VERIO REFLECT) w/Device KIT Use as advised 1 kit 0   busPIRone (BUSPAR) 10 MG tablet Take 5 mg by mouth 2 (two) times daily.     cetirizine (ZYRTEC) 10 MG tablet Take 10 mg by mouth in the morning.     colchicine  0.6 MG tablet Take  0.6-1.2 mg by mouth 2 (two) times daily as needed (gout).  cycloSPORINE (RESTASIS) 0.05 % ophthalmic emulsion Place 1 drop into both eyes 2 (two) times daily as needed (eye irritation.).     diclofenac  Sodium (VOLTAREN ) 1 % GEL Apply 2 g topically 3 (three) times daily as needed for pain.     ezetimibe  (ZETIA ) 10 MG tablet Take 1 tablet (10 mg total) by mouth daily. 90 tablet 3   fluticasone  (FLONASE ) 50 MCG/ACT nasal spray Place 1 spray into both nostrils daily as needed for allergies or rhinitis.     furosemide  (LASIX ) 40 MG tablet Take 3 tablets (120 mg total) by mouth 2 (two) times daily. 180 tablet 6   glipiZIDE  (GLUCOTROL ) 5 MG tablet Take 0.5-1 tablets (2.5-5 mg total) by mouth daily before supper. ([name] taking differently: Take 2.5-5 mg by mouth daily before supper. As needed) 60 tablet 3   HYDROcodone -acetaminophen  (NORCO) 10-325 MG tablet Take 1 tablet by mouth 3 (three) times daily as needed for moderate pain.     Meclizine HCl 25 MG CHEW Chew 25 mg by mouth 3 (three) times daily as needed (dizziness).     methocarbamol  (ROBAXIN ) 500 MG tablet Take 500 mg by mouth 3 (three) times daily as needed for muscle spasms.     nitroGLYCERIN  (NITROSTAT ) 0.4 MG SL tablet 1 TAB UNDER THE TONGUE EVERY 5 MINUTES AS NEEDED FOR CHEST PAIN UP TO 3 DOSES, IF PERSIST CALL 911 25 tablet 3   ondansetron  (ZOFRAN -ODT) 4 MG disintegrating tablet Take 1 tablet (4 mg total) by mouth every 8 (eight) hours as needed. 20 tablet 0   ONETOUCH VERIO test strip 1 each by Other route in the morning and at bedtime. 200 each 5   pantoprazole  (PROTONIX ) 40 MG tablet Take 40 mg by mouth daily as needed (heartburn).     potassium chloride  SA (KLOR-CON  M) 20 MEQ tablet Take 20 mEq by mouth daily.     Semaglutide , 1 MG/DOSE, (OZEMPIC , 1 MG/DOSE,) 4 MG/3ML SOPN Inject 1 mg into the skin once a week. ([name] taking differently: Inject 1 mg into the skin once a week. On Fridays) 9 mL 3   tadalafil (CIALIS) 20 MG tablet Take  20 mg by mouth daily as needed for erectile dysfunction.     testosterone  cypionate (DEPOTESTOSTERONE CYPIONATE) 200 MG/ML injection Inject 200 mg into the muscle once a week.     traZODone  (DESYREL ) 100 MG tablet Take 100 mg by mouth at bedtime.     Cyanocobalamin (VITAMIN B-12) 5000 MCG TBDP Take 5,000 mcg by mouth 2 (two) times a week. ([name] not taking: Reported on 03/24/2024)     docusate sodium  (COLACE) 100 MG capsule Take 200 mg by mouth at bedtime. ([name] not taking: Reported on 03/24/2024)     metoprolol  succinate (TOPROL -XL) 50 MG 24 hr tablet Take 0.5 tablets (25 mg total) by mouth daily. Take with or immediately following a meal. ([name] not taking: Reported on 03/24/2024)     No current facility-administered medications for this encounter.   BP 138/68   Pulse 73   Ht 5' 10 (1.778 m)   Wt 82.1 kg (181 lb)   SpO2 99%   BMI 25.97 kg/m    Wt Readings from Last 3 Encounters:  03/24/24 82.1 kg (181 lb)  01/14/24 82.2 kg (181 lb 3.2 oz)  01/04/24 83.4 kg (183 lb 12.8 oz)   PHYSICAL EXAM: General:  NAD. No resp difficulty, walked into clinic HEENT: Normal Neck: Supple. No JVD. Cor: Irregular rate & rhythm. No rubs, gallops or murmurs. Lungs:  Clear Abdomen: Soft, nontender, nondistended.  Extremities: No cyanosis, clubbing, rash, edema; venous stasis change to BLE Neuro: Alert & oriented x 3, moves all 4 extremities w/o difficulty. Affect pleasant.  Assessment/Plan: 1. Chronic diastolic CHF:  TEE in 8/23 with EF 55%, mild RV enlargement with normal RV function.  Echo 7/24 EF 55-60%. RV mildly reduced.  Echo 7/25 showed EF 55-60%, RV moderately reduced, normal prosthetic mitral valve, IVC dilated. CHF is worsened by CKD stage IV and persistent AF. NYHA class III primarily due to fatigue.  He is not volume overloaded, REDs 27%. - Continue Lasix  120 mg q am/80 mg q pm + 20 KCL daily. BMET/BNP today.   2. S/p mitral valve repair:  Echo in 8/22 showed mild-moderate stenosis with  mean gradient 8 mmHg and mild Jason.  TEE in 8/23 showed MV repair with mean gradient 10 mmHg but MVA 2.19 cm^2 (leaflets open well), trivial Jason.  Echo 7/24 mild Jason, mild MS, mean gradient 7.0. Echo 7/25 showed stable prosthetic mitral valve with mild Jason and mild to moderate MS, mean gradient 8.4 mmHg. - With prosthetic material, should use antibiotic prophylaxis with dental work.  3. Atrial fibrillation: Now permanent. s/p MAZE w/ recurrence.  ERAF with back to back cardioversions in 7/24 despite amiodarone .  Saw EP, not thought to be candidate for ablation.  Therefore, amiodarone  stopped and [name] started on Toprol  XL for rate control of atrial fibrillation.  [name] felt marked fatigue since starting Toprol  XL. HR is in the 70s.  - He has stopped his Toprol  XL to see if fatigue improved; he endorese some improvement with fatigue.  - Continue apixaban  5 mg bid.  4. CAD:  He had severe stenosis in a distal D1 that is not amenable to intervention.  This has been stable on last couple of caths. He has occluded RCA and LCx known from the past with SVGs to OM and PDA.  Echo in 2/22 with EF 45-50%. Cardiolite  in 11/19 showed no ischemia. Echo in 8/22 with EF up to 55-60%, TEE in 8/23 with EF 55%.  No chest pain.  - Currently not on ASA, taking apixaban .  - He cannot tolerate statins with myalgias, Now on Praluent .    5. Hyperlipidemia: Myalgias with Crestor , pravastatin , and atorvastatin .  We were finally able to get him Praluent  through the [facility]. He is also on Zetia .  6. OSA: Compliant w/ CPAP.  7. CKD stage 4: AKI without recovery, uncertain etiology.  He sees nephrology, there is concern that he is progressing towards HD. He now has AV fistula.  - BMET today.  8. Diabetes: He is now followed by endocrinology. 9. CVA: Though to be atrial fibrillation-related.  Carotid dopplers 6/22 with mild stenosis only. However, he was also noted on 8/22 echo to have a PFO.  - Continue on Eliquis .  10. Enterococcal  bacteremia: In 7/22.  On TEE, it was hard to differentiate between loose suture material and small vegetation.  He was treated for 6 wks with IV abx.   Follow up in 6 months with APP.  Harlene HERO [facility] FNP-BC 03/24/2024

## 2024-03-23 ENCOUNTER — Telehealth (HOSPITAL_COMMUNITY): Payer: Self-pay

## 2024-03-23 NOTE — Telephone Encounter (Signed)
 Called to confirm/remind patient of their appointment at the [facility] on 03/24/24.   Appointment:   [] Confirmed  [x] Left mess   [] No answer/No voice mail  [] VM Full/unable to leave message  [] Phone not in service  And to bring in all medications and/or complete list.

## 2024-03-24 ENCOUNTER — Encounter (HOSPITAL_COMMUNITY): Payer: Self-pay

## 2024-03-24 ENCOUNTER — Ambulatory Visit (HOSPITAL_COMMUNITY)
Admission: RE | Admit: 2024-03-24 | Discharge: 2024-03-24 | Disposition: A | Discharge status: Home / Self Care | Source: Ambulatory Visit | Attending: Family Medicine | Admitting: Family Medicine

## 2024-03-24 VITALS — BP 138/68 | HR 73 | Ht 70.0 in | Wt 181.0 lb

## 2024-03-24 DIAGNOSIS — Z79899 Other long term (current) drug therapy: Secondary | ICD-10-CM | POA: Insufficient documentation

## 2024-03-24 DIAGNOSIS — M791 Myalgia, unspecified site: Secondary | ICD-10-CM | POA: Diagnosis not present

## 2024-03-24 DIAGNOSIS — Z7984 Long term (current) use of oral hypoglycemic drugs: Secondary | ICD-10-CM | POA: Diagnosis not present

## 2024-03-24 DIAGNOSIS — I4819 Other persistent atrial fibrillation: Secondary | ICD-10-CM | POA: Diagnosis not present

## 2024-03-24 DIAGNOSIS — N184 Chronic kidney disease, stage 4 (severe): Secondary | ICD-10-CM

## 2024-03-24 DIAGNOSIS — Z7901 Long term (current) use of anticoagulants: Secondary | ICD-10-CM | POA: Insufficient documentation

## 2024-03-24 DIAGNOSIS — Z7985 Long-term (current) use of injectable non-insulin antidiabetic drugs: Secondary | ICD-10-CM | POA: Diagnosis not present

## 2024-03-24 DIAGNOSIS — I130 Hypertensive heart and chronic kidney disease with heart failure and stage 1 through stage 4 chronic kidney disease, or unspecified chronic kidney disease: Secondary | ICD-10-CM | POA: Diagnosis not present

## 2024-03-24 DIAGNOSIS — I4821 Permanent atrial fibrillation: Secondary | ICD-10-CM | POA: Diagnosis not present

## 2024-03-24 DIAGNOSIS — Z9889 Other specified postprocedural states: Secondary | ICD-10-CM

## 2024-03-24 DIAGNOSIS — Z952 Presence of prosthetic heart valve: Secondary | ICD-10-CM | POA: Diagnosis not present

## 2024-03-24 DIAGNOSIS — G4733 Obstructive sleep apnea (adult) (pediatric): Secondary | ICD-10-CM | POA: Diagnosis not present

## 2024-03-24 DIAGNOSIS — Z6825 Body mass index (BMI) 25.0-25.9, adult: Secondary | ICD-10-CM | POA: Insufficient documentation

## 2024-03-24 DIAGNOSIS — I5032 Chronic diastolic (congestive) heart failure: Secondary | ICD-10-CM | POA: Diagnosis present

## 2024-03-24 DIAGNOSIS — E1122 Type 2 diabetes mellitus with diabetic chronic kidney disease: Secondary | ICD-10-CM | POA: Diagnosis not present

## 2024-03-24 DIAGNOSIS — I2510 Atherosclerotic heart disease of native coronary artery without angina pectoris: Secondary | ICD-10-CM

## 2024-03-24 DIAGNOSIS — I052 Rheumatic mitral stenosis with insufficiency: Secondary | ICD-10-CM | POA: Insufficient documentation

## 2024-03-24 DIAGNOSIS — Z8673 Personal history of transient ischemic attack (TIA), and cerebral infarction without residual deficits: Secondary | ICD-10-CM | POA: Insufficient documentation

## 2024-03-24 DIAGNOSIS — Z8774 Personal history of (corrected) congenital malformations of heart and circulatory system: Secondary | ICD-10-CM | POA: Diagnosis not present

## 2024-03-24 DIAGNOSIS — Z794 Long term (current) use of insulin: Secondary | ICD-10-CM

## 2024-03-24 DIAGNOSIS — E785 Hyperlipidemia, unspecified: Secondary | ICD-10-CM | POA: Diagnosis not present

## 2024-03-24 DIAGNOSIS — I770 Arteriovenous fistula, acquired: Secondary | ICD-10-CM | POA: Diagnosis not present

## 2024-03-24 DIAGNOSIS — E669 Obesity, unspecified: Secondary | ICD-10-CM | POA: Insufficient documentation

## 2024-03-24 DIAGNOSIS — E782 Mixed hyperlipidemia: Secondary | ICD-10-CM

## 2024-03-24 DIAGNOSIS — I639 Cerebral infarction, unspecified: Secondary | ICD-10-CM

## 2024-03-24 NOTE — Patient Instructions (Addendum)
 Good to see you today!  You can take an additional dose of lasix  40 mg  as needed  Your physician recommends that you schedule a follow-up appointment 6 months(February) Call office in December to schedule an appointment  If you have any questions or concerns before your next appointment please send us  a message through [place] or call our office at [phone].    TO LEAVE A MESSAGE FOR THE NURSE SELECT OPTION 2, PLEASE LEAVE A MESSAGE INCLUDING: YOUR NAME DATE OF BIRTH CALL BACK NUMBER REASON FOR CALL**this is important as we prioritize the call backs  YOU WILL RECEIVE A CALL BACK THE SAME DAY AS LONG AS YOU CALL BEFORE 4:00 PM At the [facility], you and your health needs are our priority. As part of our continuing mission to provide you with exceptional heart care, we have created designated Provider Care Teams. These Care Teams include your primary Cardiologist (physician) and Advanced Practice Providers (APPs- Physician Assistants and Nurse Practitioners) who all work together to provide you with the care you need, when you need it.   You may see any of the following providers on your designated Care Team at your next follow up: Dr Toribio Fuel Dr Ezra Shuck Dr. Ria Commander Dr. Morene Brownie Amy Lenetta, NP Caffie Shed, [place] [facility] [place], [place] Beckey Coe, NP [place] Lee, NP Ellouise Class, NP Tinnie Redman, PharmD Jaun Bash, PharmD   Please be sure to bring in all your medications bottles to every appointment.    Thank you for choosing [facility]

## 2024-03-24 NOTE — Progress Notes (Signed)
 ReDS Vest / Clip - 03/24/24 1408       ReDS Vest / Clip   Station Marker D    Ruler Value 41    ReDS Value Range Low volume    ReDS Actual Value 27

## 2024-05-22 ENCOUNTER — Other Ambulatory Visit (HOSPITAL_COMMUNITY): Payer: Self-pay | Admitting: Cardiology

## 2024-05-22 MED ORDER — POTASSIUM CHLORIDE CRYS ER 20 MEQ PO TBCR: 20.0000 meq | EXTENDED_RELEASE_TABLET | Freq: Every day | ORAL | 3 refills | Status: DC

## 2024-06-05 ENCOUNTER — Encounter: Payer: Self-pay | Admitting: Internal Medicine

## 2024-06-05 ENCOUNTER — Ambulatory Visit: Admitting: Internal Medicine

## 2024-06-05 VITALS — BP 110/60 | HR 69 | Ht 70.0 in | Wt 174.0 lb

## 2024-06-05 DIAGNOSIS — E1159 Type 2 diabetes mellitus with other circulatory complications: Secondary | ICD-10-CM | POA: Diagnosis not present

## 2024-06-05 DIAGNOSIS — E66811 Obesity, class 1: Secondary | ICD-10-CM

## 2024-06-05 DIAGNOSIS — E782 Mixed hyperlipidemia: Secondary | ICD-10-CM

## 2024-06-05 DIAGNOSIS — E1165 Type 2 diabetes mellitus with hyperglycemia: Secondary | ICD-10-CM | POA: Diagnosis not present

## 2024-06-05 DIAGNOSIS — Z7985 Long-term (current) use of injectable non-insulin antidiabetic drugs: Secondary | ICD-10-CM

## 2024-06-05 LAB — POCT GLYCOSYLATED HEMOGLOBIN (HGB A1C): Hemoglobin A1C: 7.9 % — AB (ref 4.0–5.6)

## 2024-06-05 MED ORDER — LANTUS SOLOSTAR 100 UNIT/ML ~~LOC~~ SOPN: 8.0000 [IU] | PEN_INJECTOR | Freq: Every day | SUBCUTANEOUS | 99 refills | Status: DC

## 2024-06-05 MED ORDER — INSULIN PEN NEEDLE 32G X 4 MM MISC: 3 refills | Status: AC

## 2024-06-05 MED ORDER — OZEMPIC (0.25 OR 0.5 MG/DOSE) 2 MG/1.5ML ~~LOC~~ SOPN: 0.5000 mg | PEN_INJECTOR | SUBCUTANEOUS | 3 refills | Status: DC

## 2024-06-05 MED ORDER — FREESTYLE LIBRE 3 PLUS SENSOR MISC: 1.0000 | 3 refills | Status: DC

## 2024-06-05 NOTE — Progress Notes (Signed)
 Patient ID: [name], male   DOB: 1945-11-27, 78 y.o.   MRN: [id number]   HPI: [name] is a 78 y.o.-year-old male, initially referred by his nephrologist, Dr. 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 fib, CKD stage V, cerebrovascular disease, Aortic atherosclerosis, s/p TIA, ED).  Last visit 5 months ago.  He is here accompanied by his wife, who is also my patient.  She offers part of the history especially relating to medication, past medical history, and diet.  Interim history: No blurry vision, nausea, chest pain but has chronic shortness of breath. He has fatigue.  He doe not have an appetite >> lost 7 lbs since last OV. He still eats sweets - wife mentions that he may not have a taste for eating food, but he is eating sweets..  Reviewed HbA1c levels: 04/10/2024: HbA1c 7.7% Lab Results  Component Value Date   HGBA1C 7.0 (A) 01/04/2024   HGBA1C 7.4 (A) 07/15/2023   HGBA1C 6.8 (A) 01/11/2023   HGBA1C 6.8 (A) 09/10/2022   HGBA1C 6.8 (H) 05/19/2022   HGBA1C 5.9 (A) 02/25/2022   HGBA1C 6.3 (A) 10/30/2021   HGBA1C 6.4 (A) 06/19/2021   HGBA1C 6.2 (H) 02/02/2021   HGBA1C 6.3 (H) 01/07/2021  08/23/2023: HbA1c 7.8% ([place]) 09/24/2021: HbA1c 5.8% 01/11/2019: HbA1c 8.3% 10/04/2018: HbA1c 8.2%  He is currently on: - Ozempic  0.5 >> 1 mg weekly - through the [place]. - Glipizide  2.5 to 5 mg as needed before a  larger dinner >> does not take it He came off metformin  due to worsening kidney function. We stopped glipizide  09/2020 due to good control.  Pt is not checking his sugars  - a CGM was not approved for him since he was not on insulin . From before: - am:  90-110, 120 >> 127,168 >> 105, 127-181 >> 90-110 - 2h after b'fast:  90, 110 >> n/c >> 140 >> 153 >> n/c - before lunch: n/c >> 102-130 >> n/c >> 110 - 2h after lunch: 134, 165 >> n/c >> 155 >> 133 >> n/c -  before dinner: 129 >> n/c >> 86 >> n/c - 2h after dinner: n/c >> 190 >> n/c >> 110 - bedtime: n/c >> 77-122 >> n/c >> 120 >> 157 >> n/c - nighttime: n/c >> 180 (sick - steroids) Lowest sugar was 69 >> ...  115 >> 105 >> 90; he has hypoglycemia awareness in the 70s. Highest sugar was 299 >> ... 168 >> 181 (pasta the night before) >> candy, icecream: 200  Pt's meals are: - Breakfast: egg and bacon (not lately).   - Brunch: meat + veggies - Dinner: same as lunch - Snacks: crackers, popcorn   -He has stage V CKD-sees nephrology (Dr. Teri not have to start dialysis yet but has a fistula placed; last BUN/creatinine:  Lab Results  Component Value Date   BUN 46 (H) 01/14/2024   BUN 47 (H) 09/24/2023   CREATININE 4.22 (H) 01/14/2024   CREATININE 4.28 (H) 09/24/2023  He is not on ACE inhibitor/ARB.  -+ HL; last set of lipids: Lab Results  Component Value Date   CHOL 72 01/14/2024   HDL 39 (L) 01/14/2024   LDLCALC 20 01/14/2024   TRIG 64 01/14/2024   CHOLHDL 1.8 01/14/2024  He developed generalized aches and pains from pravastatin .  He restarted Zetia  12/2021 by cardiology.  He is also on Praluent .  - last  eye exam was in 03/2024: Reportedly no DR ([facility]).   - no numbness and tingling in his feet. + pain. Last foot exam 07/15/2023.  Pt has FH of DM in sister and brother.  He also has hypertension. He has a stroke 12/2020 - weak R side of body, but improving. He was in PT. He sees neurology. He was admitted 02/01/2021 for chest pain. He had AKI. He has a history of lap band surgery 2010. Lost from 373 lbs to 215-220 lbs. He was admitted 03/2022 with ureteric stone and hydronephrosis along with AKI.  He subsequently had to have cystoscopy/ureteroscopy. He was admitted for SBO 06/2023.  He recovered well.   04/10/2024: B12 vitamin 1171 01/23/2021: B12 vitamin 733, TSH 1.83  ROS: C+ see HPI  I reviewed pt's medications, allergies, PMH, social hx, family hx, and changes were  documented in the history of present illness. Otherwise, unchanged from my initial visit note.  Past Medical History:  Diagnosis Date   Allergic rhinitis    Anxiety    Atrial fibrillation (HCC)    Cancer (HCC)    hx of skin cancers    Chronic kidney disease    Coronary artery disease    a. s/p MI & CABG; b. s/p PCI Diag;  c. Cath 12/2011: LAD diff dzs/small, Diag 75% isr, 90 dist to stent (small), LCX & RCA occluded, VG->OM 40, VG->PDA patent - med rx.   COVID-19    x 3   Depression    PTSD   Diabetes mellitus    not on medications   Diastolic CHF, chronic (HCC)    a. EF 55-60% by echo 2007   GERD (gastroesophageal reflux disease)    not anymore  I had a lap band   History of diverticulitis of colon    5 YRS AGO   History of kidney stones    History of pneumonia    2017   History of transient ischemic attack (TIA)    CAROTID DOPPLERS NOV 2011  0-39& BIL. 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)    Past Surgical History:  Procedure Laterality Date   AV FISTULA PLACEMENT Left 08/02/2018   Procedure: ARTERIOVENOUS (AV) FISTULA CREATION RADIOCEPHALIC;  Surgeon: Eliza Lonni RAMAN, MD;  Location: [facility] OR;  Service: Vascular;  Laterality: Left;   BLEPHAROPLASTY  1985    BILATERAL   CARDIAC CATHETERIZATION  2001, 2004, 2009, 2011   CARDIAC CATHETERIZATION N/A 01/23/2016   Procedure: Right/Left Heart Cath and Coronary Angiography;  Surgeon: Ezra RAMAN Shuck, MD;  Location: [facility] INVASIVE CV LAB;  Service: Cardiovascular;  Laterality: N/A;   CARDIOVERSION N/A 09/07/2017  Procedure: CARDIOVERSION;  Surgeon: Rolan Ezra RAMAN, MD;  Location: [facility] ENDOSCOPY;  Service: Cardiovascular;  Laterality: N/A;   CARDIOVERSION N/A 09/13/2019   Procedure: CARDIOVERSION;  Surgeon: Rolan Ezra RAMAN, MD;  Location: [facility] ENDOSCOPY;  Service: Cardiovascular;  Laterality: N/A;   CARDIOVERSION N/A 10/19/2019   Procedure: CARDIOVERSION;  Surgeon: Rolan Ezra RAMAN, MD;  Location: [facility] ENDOSCOPY;  Service: Cardiovascular;  Laterality: N/A;   CARDIOVERSION N/A 04/14/2022   Procedure: CARDIOVERSION;  Surgeon: Rolan Ezra RAMAN, MD;  Location: [facility] ENDOSCOPY;  Service: Cardiovascular;  Laterality: N/A;   CARDIOVERSION N/A 02/22/2023   Procedure: CARDIOVERSION;  Surgeon: Rolan Ezra RAMAN, MD;  Location: [facility] INVASIVE CV LAB;  Service: Cardiovascular;  Laterality: N/A;   CARDIOVERSION N/A 03/12/2023   Procedure: CARDIOVERSION;  Surgeon: Rolan Ezra RAMAN, MD;  Location: [facility] INVASIVE CV LAB;  Service: Cardiovascular;  Laterality: N/A;   CATARACT EXTRACTION W/ INTRAOCULAR LENS IMPLANT Bilateral    CHONDROPLASTY  07/22/2011   Procedure: CHONDROPLASTY;  Surgeon: Dempsey LULLA Moan;  Location: [facility];  Service: Orthopedics;;   CIRCUMCISION  35 YRS  AGO   COLONOSCOPY     CORONARY ANGIOPLASTY  1996-- POST CABG   W/ STENT, last cath 01/07/2012    CORONARY ARTERY BYPASS GRAFT  1995   X3 VESSEL   CORONARY ARTERY BYPASS GRAFT  1995   CORONARY STENT PLACEMENT     CYSTOSCOPY W/ URETERAL STENT PLACEMENT Right 04/11/2022   Procedure: CYSTOSCOPY WITH RETROGRADE PYELOGRAM/URETERAL STENT PLACEMENT;  Surgeon: Sherrilee Belvie CROME, MD;  Location: [facility] ORS;  Service: Urology;  Laterality: Right;    CYSTOSCOPY/URETEROSCOPY/HOLMIUM LASER/STENT PLACEMENT Right 01/08/2021   Procedure: CYSTOSCOPY/RETROGRADE/URETEROSCOPY/HOLMIUM LASER/STENT PLACEMENT;  Surgeon: Matilda Senior, MD;  Location: [facility] ORS;  Service: Urology;  Laterality: Right;   CYSTOSCOPY/URETEROSCOPY/HOLMIUM LASER/STENT PLACEMENT Right 05/27/2022   Procedure: CYSTOSCOPY, URETEROSCOPY, HOLMIUM LASER, STENT EXCHANGE;  Surgeon: Devere Lonni Righter, MD;  Location: [facility] ORS;  Service: Urology;  Laterality: Right;   INCISION AND DRAINAGE PERIRECTAL ABSCESS N/A 09/01/2021   Procedure: IRRIGATION AND DEBRIDEMENT PERIRECTAL ABSCESS;  Surgeon: Stevie Herlene Righter, MD;  Location: [facility] ORS;  Service: General;  Laterality: N/A;   IR PERC TUN PERIT CATH WO PORT S&I SHERRILL  03/06/2021   IR REMOVAL TUN CV CATH W/O FL  04/14/2021   KNEE ARTHROSCOPY  07/22/2011   Procedure: ARTHROSCOPY KNEE;  Surgeon: Dempsey LULLA Moan;  Location: [facility];  Service: Orthopedics;  Laterality: Right;  WITH DEBRIDEMENT    KNEE ARTHROSCOPY W/ MENISCECTOMY  X2 IN 2002-- RIGHT KNEE   LAPAROSCOPIC GASTRIC BANDING  01-15-09   LEFT HEART CATH AND CORONARY ANGIOGRAPHY N/A 04/29/2017   Procedure: LEFT HEART CATH AND CORONARY ANGIOGRAPHY;  Surgeon: Rolan Ezra RAMAN, MD;  Location: [facility] INVASIVE CV LAB;  Service: Cardiovascular;  Laterality: N/A;   LEFT HEART CATHETERIZATION WITH CORONARY ANGIOGRAM N/A 09/11/2013   Procedure: LEFT HEART CATHETERIZATION WITH CORONARY ANGIOGRAM;  Surgeon: Ezra RAMAN Rolan, MD;  Location: [facility] CATH LAB;  Service: Cardiovascular;  Laterality: N/A;   MITRAL VALVE REPAIR Right 03/25/2016   Procedure: MINIMALLY INVASIVE REOPERATION FOR MITRAL VALVE REPAIR  (MVR) with size 26 Sorin Memo 3D;  Surgeon: Sudie VEAR Laine, MD;  Location: [facility] OR;  Service: Open Heart Surgery;  Laterality: Right;   MITRAL VALVE REPAIR  03/2016   RIGHT KNEE ED COMPARTMENT REPLACEMENT  2004   SYNOVECTOMY  07/22/2011   Procedure: SYNOVECTOMY;  Surgeon: Dempsey LULLA Aluisio;   Location: [facility];  Service: Orthopedics;;   TEE WITHOUT CARDIOVERSION  N/A 01/23/2016   Procedure: TRANSESOPHAGEAL ECHOCARDIOGRAM (TEE);  Surgeon: Ezra GORMAN Shuck, MD;  Location: [facility] ENDOSCOPY;  Service: Cardiovascular;  Laterality: N/A;   TEE WITHOUT CARDIOVERSION N/A 03/25/2016   Procedure: TRANSESOPHAGEAL ECHOCARDIOGRAM (TEE);  Surgeon: Sudie VEAR Laine, MD;  Location: [facility] OR;  Service: Open Heart Surgery;  Laterality: N/A;   TEE WITHOUT CARDIOVERSION N/A 03/04/2021   Procedure: TRANSESOPHAGEAL ECHOCARDIOGRAM (TEE);  Surgeon: Pietro Redell GORMAN, MD;  Location: [facility] ENDOSCOPY;  Service: Cardiovascular;  Laterality: N/A;   TEE WITHOUT CARDIOVERSION N/A 04/14/2022   Procedure: TRANSESOPHAGEAL ECHOCARDIOGRAM (TEE);  Surgeon: Shuck Ezra GORMAN, MD;  Location: [facility] ENDOSCOPY;  Service: Cardiovascular;  Laterality: N/A;   UMBILICAL HERNIA REPAIR  01-15-09   W/ GASTRIC BANDING PROCEDURE   Social History   Socioeconomic History   Marital status: Married    Spouse name: Not on file   Number of children: 1   Years of education: Not on file   Highest education level: Not on file  Occupational History   Contractor, semi-retired  Social Needs   Financial resource strain: Not on file   Food insecurity:    Worry: Not on file    Inability: Not on file   Transportation needs:    Medical: Not on file    Non-medical: Not on file  Tobacco Use   Smoking status: Never Smoker   Smokeless tobacco: Never Used  Substance and Sexual Activity   Alcohol use: Yes, liquor    Comment: OCCASIONAL   Drug use: No   Sexual activity: Not on file  Lifestyle   Physical activity:    Days per week: Not on file    Minutes per session: Not on file   Stress: Not on file  Relationships   Social connections:    Talks on phone: Not on file    Gets together: Not on file    Attends religious service: Not on file    Active member of club or organization: Not on file    Attends meetings of clubs or  organizations: Not on file    Relationship status: Not on file   Intimate partner violence:    Fear of current or ex partner: Not on file    Emotionally abused: Not on file    Physically abused: Not on file    Forced sexual activity: Not on file  Other Topics Concern   Not on file  Social History Narrative   Not on file   Current Outpatient Medications on File Prior to Visit  Medication Sig Dispense Refill   albuterol  (VENTOLIN  HFA) 108 (90 Base) MCG/ACT inhaler Inhale 1-2 puffs into the lungs every 6 (six) hours as needed for shortness of breath or wheezing.     Alirocumab  (PRALUENT ) 75 MG/ML SOAJ Inject 75 mg into the skin every 14 (fourteen) days.     allopurinol  (ZYLOPRIM ) 100 MG tablet Take 50 mg by mouth 2 (two) times a week.     apixaban  (ELIQUIS ) 5 MG TABS tablet Take 1 tablet (5 mg total) by mouth 2 (two) times daily. 180 tablet 3   Blood Glucose Monitoring Suppl (ONETOUCH VERIO REFLECT) w/Device KIT Use as advised 1 kit 0   busPIRone (BUSPAR) 10 MG tablet Take 5 mg by mouth 2 (two) times daily.     cetirizine (ZYRTEC) 10 MG tablet Take 10 mg by mouth in the morning.     colchicine  0.6 MG tablet Take 0.6-1.2 mg by mouth 2 (two) times daily as needed (gout).  Cyanocobalamin (VITAMIN B-12) 5000 MCG TBDP Take 5,000 mcg by mouth 2 (two) times a week. (Patient not taking: Reported on 03/24/2024)     cycloSPORINE (RESTASIS) 0.05 % ophthalmic emulsion Place 1 drop into both eyes 2 (two) times daily as needed (eye irritation.).     diclofenac  Sodium (VOLTAREN ) 1 % GEL Apply 2 g topically 3 (three) times daily as needed for pain.     docusate sodium  (COLACE) 100 MG capsule Take 200 mg by mouth at bedtime. (Patient not taking: Reported on 03/24/2024)     ezetimibe  (ZETIA ) 10 MG tablet Take 1 tablet (10 mg total) by mouth daily. 90 tablet 3   fluticasone  (FLONASE ) 50 MCG/ACT nasal spray Place 1 spray into both nostrils daily as needed for allergies or rhinitis.     furosemide  (LASIX ) 40 MG  tablet Take 3 tablets (120 mg total) by mouth 2 (two) times daily. 180 tablet 6   glipiZIDE  (GLUCOTROL ) 5 MG tablet Take 0.5-1 tablets (2.5-5 mg total) by mouth daily before supper. (Patient taking differently: Take 2.5-5 mg by mouth daily before supper. As needed) 60 tablet 3   HYDROcodone -acetaminophen  (NORCO) 10-325 MG tablet Take 1 tablet by mouth 3 (three) times daily as needed for moderate pain.     Meclizine HCl 25 MG CHEW Chew 25 mg by mouth 3 (three) times daily as needed (dizziness).     methocarbamol  (ROBAXIN ) 500 MG tablet Take 500 mg by mouth 3 (three) times daily as needed for muscle spasms.     metoprolol  succinate (TOPROL -XL) 50 MG 24 hr tablet Take 0.5 tablets (25 mg total) by mouth daily. Take with or immediately following a meal. (Patient not taking: Reported on 03/24/2024)     nitroGLYCERIN  (NITROSTAT ) 0.4 MG SL tablet 1 TAB UNDER THE TONGUE EVERY 5 MINUTES AS NEEDED FOR CHEST PAIN UP TO 3 DOSES, IF PERSIST CALL 911 25 tablet 3   ondansetron  (ZOFRAN -ODT) 4 MG disintegrating tablet Take 1 tablet (4 mg total) by mouth every 8 (eight) hours as needed. 20 tablet 0   ONETOUCH VERIO test strip 1 each by Other route in the morning and at bedtime. 200 each 5   pantoprazole  (PROTONIX ) 40 MG tablet Take 40 mg by mouth daily as needed (heartburn).     potassium chloride  SA (KLOR-CON  M) 20 MEQ tablet Take 1 tablet (20 mEq total) by mouth daily. 90 tablet 3   Semaglutide , 1 MG/DOSE, (OZEMPIC , 1 MG/DOSE,) 4 MG/3ML SOPN Inject 1 mg into the skin once a week. (Patient taking differently: Inject 1 mg into the skin once a week. On Fridays) 9 mL 3   tadalafil (CIALIS) 20 MG tablet Take 20 mg by mouth daily as needed for erectile dysfunction.     testosterone  cypionate (DEPOTESTOSTERONE CYPIONATE) 200 MG/ML injection Inject 200 mg into the muscle once a week.     traZODone  (DESYREL ) 100 MG tablet Take 100 mg by mouth at bedtime.     No current facility-administered medications on file prior to visit.    Allergies  Allergen Reactions   Dilaudid  [Hydromorphone  Hcl] Itching   Crestor  [Rosuvastatin ] Other (See Comments)    Muscle aches    Lipitor [Atorvastatin ] Other (See Comments)    Muscle aches   Pravastatin  Other (See Comments)    Muscle aches   Carvedilol  Other (See Comments)    loss of appetite   Metformin  Diarrhea   Serotonin Reuptake Inhibitors (Ssris) Other (See Comments)    Muscle aches   Zocor [Simvastatin] Other (See Comments)  Muscle pain   Codeine Itching and Other (See Comments)    Extremity tingling-- can take synthetic   Family History  Problem Relation Age of Onset   Other Mother        joint problems   Alzheimer's disease Mother    Hypertension Father    Heart disease Father    CVA Father        age 78   Depression Father    Heart disease Sister        CABG   Stroke Sister    Heart failure Sister        congestive   Diabetes Sister    Heart disease Brother    Hypertension Brother    Congestive Heart Failure Brother    PE: BP 110/60   Pulse 69   Ht 5' 10 (1.778 m)   Wt 174 lb (78.9 kg)   SpO2 99%   BMI 24.97 kg/m  Wt Readings from Last 15 Encounters:  06/05/24 174 lb (78.9 kg)  03/24/24 181 lb (82.1 kg)  01/14/24 181 lb 3.2 oz (82.2 kg)  01/04/24 183 lb 12.8 oz (83.4 kg)  09/28/23 186 lb 12.8 oz (84.7 kg)  09/24/23 188 lb 0.8 oz (85.3 kg)  07/21/23 188 lb (85.3 kg)  07/15/23 192 lb 3.2 oz (87.2 kg)  06/09/23 196 lb 6.4 oz (89.1 kg)  05/21/23 199 lb 3.2 oz (90.4 kg)  04/15/23 202 lb 3.2 oz (91.7 kg)  04/08/23 203 lb 9.6 oz (92.4 kg)  03/30/23 192 lb 3.2 oz (87.2 kg)  03/12/23 192 lb (87.1 kg)  03/09/23 194 lb 9.6 oz (88.3 kg)   Constitutional: overweight, in NAD Eyes: EOMI, no exophthalmos ENT: no thyromegaly, no cervical lymphadenopathy Cardiovascular: RRR, No MRG Respiratory: CTA B  Musculoskeletal: no deformities Skin: ecchymoses on bilateral dorsum of hands Neurological: + tremor with outstretched R hand Diabetic Foot  Exam - Simple   Simple Foot Form Diabetic Foot exam was performed with the following findings: Yes 06/05/2024  2:58 PM  Visual Inspection No deformities, no ulcerations, no other skin breakdown bilaterally: Yes Sensation Testing Intact to touch and monofilament testing bilaterally: Yes Pulse Check Posterior Tibialis and Dorsalis pulse intact bilaterally: Yes Comments Onychodystrophy     ASSESSMENT: 1. DM2, non-insulin -dependent, uncontrolled, with long-term complications - CAD, s/p coronaroplasty, then CABG 1995 - cardiologist Dr. Rolan - CHF, EF 55 to 65% - Atrial fibrillation, cardioversion in 2019 - Carotid artery disease - Cerebrovascular disease, s/p TIA, s/p stroke 12/2020 - Ao ATS per abdominal CT (08/31/2021) - CKD stage V, pending dialysis  - ED  2. HL  3.  Overweight  PLAN:  1. Patient with longstanding, previously uncontrolled type 2 diabetes, with improved control in the last 4 years, but the higher HbA1c level obtained on 04/10/2024: 7.7%, increased from 7.0% at last visit. - At last visit, sugars were improved.  He was not checking them consistently but whenever checked they were at goal.  I did not recommend to change her regimen.  She did have a history of SBO and was waiting for an appointment with GI.  We did not stop Ozempic  at that time but recommended to discuss with them to see if this was needed. - Of note, I previously recommended a CGM and tried both Dexcom and freestyle libre 3, sent to the [place] pharmacy, but he was not able to start as the insurance did not cover this in the absence of insulin  treatment.  - At today's visit,  he is not checking blood sugars.  However, HbA1c is higher (see below).  He describes not having an appetite, but wife mentions that he is clearly sweets.  He continues to lose weight, he lost almost 30 pounds in the last year.  Therefore, I advised him to try to reduce the dose of Ozempic .  In the meantime, since his sugars are higher, we  discussed about adding a low-dose insulin  he agrees with the plan.  Since we are starting insulin , I will also send a prescription for the freestyle libre CGM to his pharmacy. - I suggested to:  Patient Instructions  Please reduce: - Ozempic  0.5 mg weekly   On the Ozempic  1 mg pen: - 18 clicks  - 0.25 mg - 36 clicks - 0.5 mg - 54 clicks - 0.75 mg - 72 clicks - 1 mg  Please add: - Lantus 8 units daily in am (may need to increase the dose to 10 units or higher if the sugars remain elevated).   Start the CGM.  Please return in 5 months.  - we checked his HbA1c: 7.9% (higher) - advised to check sugars at different times of the day - 4x a day, rotating check times - advised for yearly eye exams >> he is UTD - return to clinic in 5 months  2. HL - Latest lipid panel reviewed from 12/2023: Fractions at goal except for a slightly low HDL: Lab Results  Component Value Date   CHOL 72 01/14/2024   HDL 39 (L) 01/14/2024   LDLCALC 20 01/14/2024   TRIG 64 01/14/2024   CHOLHDL 1.8 01/14/2024  -He is on Zetia  10 mg daily and Praluent .  He could not tolerate statins due to muscle aches.  3.  Overweight - h/o Obesity class I - He continues on Ozempic  which should also help with weight loss - He lost 17 pounds before the last 2 visits combined - He lost 7 more pounds since last visit  Jason Fendt, MD PhD [facility] Endocr[facility]ology

## 2024-06-05 NOTE — Patient Instructions (Addendum)
 Please reduce: - Ozempic  0.5 mg weekly   On the Ozempic  1 mg pen: - 18 clicks  - 0.25 mg - 36 clicks - 0.5 mg - 54 clicks - 0.75 mg - 72 clicks - 1 mg  Please add: - Lantus 8 units daily in am (may need to increase the dose to 10 units or higher if the sugars remain elevated).   Start the CGM.  Please return in 5 months.

## 2024-06-08 ENCOUNTER — Ambulatory Visit (HOSPITAL_COMMUNITY)
Admission: RE | Admit: 2024-06-08 | Discharge: 2024-06-08 | Disposition: A | Discharge status: Home / Self Care | Source: Ambulatory Visit | Attending: Physician Assistant | Admitting: Physician Assistant

## 2024-06-08 ENCOUNTER — Encounter (HOSPITAL_COMMUNITY): Payer: Self-pay

## 2024-06-08 ENCOUNTER — Ambulatory Visit (HOSPITAL_COMMUNITY): Payer: Self-pay | Admitting: Physician Assistant

## 2024-06-08 VITALS — BP 118/58 | HR 85 | Ht 70.0 in | Wt 177.6 lb

## 2024-06-08 DIAGNOSIS — Z9889 Other specified postprocedural states: Secondary | ICD-10-CM

## 2024-06-08 DIAGNOSIS — E785 Hyperlipidemia, unspecified: Secondary | ICD-10-CM | POA: Insufficient documentation

## 2024-06-08 DIAGNOSIS — Z79899 Other long term (current) drug therapy: Secondary | ICD-10-CM | POA: Insufficient documentation

## 2024-06-08 DIAGNOSIS — Z951 Presence of aortocoronary bypass graft: Secondary | ICD-10-CM | POA: Insufficient documentation

## 2024-06-08 DIAGNOSIS — Z7901 Long term (current) use of anticoagulants: Secondary | ICD-10-CM | POA: Diagnosis not present

## 2024-06-08 DIAGNOSIS — Z952 Presence of prosthetic heart valve: Secondary | ICD-10-CM | POA: Insufficient documentation

## 2024-06-08 DIAGNOSIS — Q2112 Patent foramen ovale: Secondary | ICD-10-CM | POA: Diagnosis not present

## 2024-06-08 DIAGNOSIS — Z7985 Long-term (current) use of injectable non-insulin antidiabetic drugs: Secondary | ICD-10-CM | POA: Insufficient documentation

## 2024-06-08 DIAGNOSIS — N179 Acute kidney failure, unspecified: Secondary | ICD-10-CM | POA: Insufficient documentation

## 2024-06-08 DIAGNOSIS — Z955 Presence of coronary angioplasty implant and graft: Secondary | ICD-10-CM | POA: Diagnosis not present

## 2024-06-08 DIAGNOSIS — I770 Arteriovenous fistula, acquired: Secondary | ICD-10-CM | POA: Insufficient documentation

## 2024-06-08 DIAGNOSIS — Z6825 Body mass index (BMI) 25.0-25.9, adult: Secondary | ICD-10-CM | POA: Insufficient documentation

## 2024-06-08 DIAGNOSIS — Z7984 Long term (current) use of oral hypoglycemic drugs: Secondary | ICD-10-CM | POA: Diagnosis not present

## 2024-06-08 DIAGNOSIS — I5032 Chronic diastolic (congestive) heart failure: Secondary | ICD-10-CM

## 2024-06-08 DIAGNOSIS — Z8619 Personal history of other infectious and parasitic diseases: Secondary | ICD-10-CM | POA: Insufficient documentation

## 2024-06-08 DIAGNOSIS — Z9884 Bariatric surgery status: Secondary | ICD-10-CM | POA: Insufficient documentation

## 2024-06-08 DIAGNOSIS — I2582 Chronic total occlusion of coronary artery: Secondary | ICD-10-CM | POA: Insufficient documentation

## 2024-06-08 DIAGNOSIS — N184 Chronic kidney disease, stage 4 (severe): Secondary | ICD-10-CM | POA: Diagnosis not present

## 2024-06-08 DIAGNOSIS — I4821 Permanent atrial fibrillation: Secondary | ICD-10-CM | POA: Diagnosis not present

## 2024-06-08 DIAGNOSIS — G4733 Obstructive sleep apnea (adult) (pediatric): Secondary | ICD-10-CM | POA: Diagnosis not present

## 2024-06-08 DIAGNOSIS — I130 Hypertensive heart and chronic kidney disease with heart failure and stage 1 through stage 4 chronic kidney disease, or unspecified chronic kidney disease: Secondary | ICD-10-CM | POA: Diagnosis not present

## 2024-06-08 DIAGNOSIS — Z8673 Personal history of transient ischemic attack (TIA), and cerebral infarction without residual deficits: Secondary | ICD-10-CM | POA: Insufficient documentation

## 2024-06-08 DIAGNOSIS — R0789 Other chest pain: Secondary | ICD-10-CM | POA: Diagnosis not present

## 2024-06-08 DIAGNOSIS — I2510 Atherosclerotic heart disease of native coronary artery without angina pectoris: Secondary | ICD-10-CM | POA: Diagnosis not present

## 2024-06-08 DIAGNOSIS — E669 Obesity, unspecified: Secondary | ICD-10-CM | POA: Diagnosis not present

## 2024-06-08 DIAGNOSIS — E1122 Type 2 diabetes mellitus with diabetic chronic kidney disease: Secondary | ICD-10-CM | POA: Insufficient documentation

## 2024-06-08 LAB — BRAIN NATRIURETIC PEPTIDE: B Natriuretic Peptide: 191.6 pg/mL — ABNORMAL HIGH (ref 0.0–100.0)

## 2024-06-08 LAB — COMPREHENSIVE METABOLIC PANEL WITH GFR
ALT: 12 U/L (ref 0–44)
AST: 13 U/L — ABNORMAL LOW (ref 15–41)
Albumin: 3.8 g/dL (ref 3.5–5.0)
Alkaline Phosphatase: 118 U/L (ref 38–126)
Anion gap: 14 (ref 5–15)
BUN: 56 mg/dL — ABNORMAL HIGH (ref 8–23)
CO2: 23 mmol/L (ref 22–32)
Calcium: 9.9 mg/dL (ref 8.9–10.3)
Chloride: 98 mmol/L (ref 98–111)
Creatinine, Ser: 4.55 mg/dL — ABNORMAL HIGH (ref 0.61–1.24)
GFR, Estimated: 12 mL/min — ABNORMAL LOW (ref >60)
Glucose, Bld: 170 mg/dL — ABNORMAL HIGH (ref 70–99)
Potassium: 3 mmol/L — ABNORMAL LOW (ref 3.5–5.1)
Sodium: 135 mmol/L (ref 135–145)
Total Bilirubin: 0.9 mg/dL (ref 0.0–1.2)
Total Protein: 6.3 g/dL — ABNORMAL LOW (ref 6.5–8.1)

## 2024-06-08 LAB — CBC
HCT: 32.2 % — ABNORMAL LOW (ref 39.0–52.0)
Hemoglobin: 11 g/dL — ABNORMAL LOW (ref 13.0–17.0)
MCH: 32.5 pg (ref 26.0–34.0)
MCHC: 34.2 g/dL (ref 30.0–36.0)
MCV: 95.3 fL (ref 80.0–100.0)
Platelets: 176 K/uL (ref 150–400)
RBC: 3.38 MIL/uL — ABNORMAL LOW (ref 4.22–5.81)
RDW: 13.9 % (ref 11.5–15.5)
WBC: 6.9 K/uL (ref 4.0–10.5)
nRBC: 0 % (ref 0.0–0.2)

## 2024-06-08 MED ORDER — TORSEMIDE 20 MG PO TABS: 60.0000 mg | ORAL_TABLET | Freq: Two times a day (BID) | ORAL | 0 refills | Status: DC

## 2024-06-08 MED ORDER — TORSEMIDE 20 MG PO TABS: 60.0000 mg | ORAL_TABLET | Freq: Every day | ORAL | 0 refills | Status: DC

## 2024-06-08 MED ORDER — TORSEMIDE 20 MG PO TABS: 60.0000 mg | ORAL_TABLET | Freq: Two times a day (BID) | ORAL | 3 refills | Status: DC

## 2024-06-08 MED ORDER — POTASSIUM CHLORIDE CRYS ER 20 MEQ PO TBCR: 40.0000 meq | EXTENDED_RELEASE_TABLET | Freq: Every day | ORAL | 3 refills | Status: AC

## 2024-06-08 MED ORDER — NITROGLYCERIN 0.4 MG SL SUBL: SUBLINGUAL_TABLET | SUBLINGUAL | 3 refills | Status: AC

## 2024-06-08 NOTE — Progress Notes (Signed)
 ReDS Vest / Clip - 06/08/24 1039       ReDS Vest / Clip   Station Marker C    Ruler Value 31    ReDS Value Range Moderate volume overload    ReDS Actual Value 37

## 2024-06-08 NOTE — Patient Instructions (Addendum)
 STOP Lasix .  START Torsemide 80 mg ( 4 tabs) Twice daily for 2 days only, then go to 60 mg ( 3 Tab) Twice daily.  Labs done today, your results will be available in MyChart, we will contact you for abnormal readings.  Your physician recommends that you schedule a follow-up appointment in: 1 month.  If you have any questions or concerns before your next appointment please send us  a message through [place] or call our office at [phone].    TO LEAVE A MESSAGE FOR THE NURSE SELECT OPTION 2, PLEASE LEAVE A MESSAGE INCLUDING: YOUR NAME DATE OF BIRTH CALL BACK NUMBER REASON FOR CALL**this is important as we prioritize the call backs  YOU WILL RECEIVE A CALL BACK THE SAME DAY AS LONG AS YOU CALL BEFORE 4:00 PM  At the [facility], you and your health needs are our priority. As part of our continuing mission to provide you with exceptional heart care, we have created designated Provider Care Teams. These Care Teams include your primary Cardiologist (physician) and Advanced Practice Providers (APPs- Physician Assistants and Nurse Practitioners) who all work together to provide you with the care you need, when you need it.   You may see any of the following providers on your designated Care Team at your next follow up: Dr Toribio Fuel Dr Ezra Shuck Dr. Ria Commander Dr. Morene Brownie Amy Lenetta, NP Caffie Shed, [place] [facility] [place], [place] Beckey Coe, NP [place] Lee, NP Ellouise Class, NP Tinnie Redman, PharmD Jaun Bash, PharmD   Please be sure to bring in all your medications bottles to every appointment.    Thank you for choosing [facility]

## 2024-06-08 NOTE — Progress Notes (Addendum)
 Patient ID: [name], male   DOB: 04/02/1946, 78 y.o.   MRN: [id number] PCP: Dr Okey Cardiology: Dr. Rolan Nephrology: Dr. Norine  EP: Dr. Nancey  Mr [name] is a 78 y.o. with history of CAD s/p CABG and obesity s/p lap banding procedure/  Given exertional dyspnea and chest tightness, he had LHC in 1/15.  This showed no change from prior.  Grafts were patent and there was severe stenosis in distal D1, not amenable to PCI.   In 2017, he developed increased exertional dyspnea.  Eventually had a TEE showing severe MR with prolapse of a portion of the anterior mitral valve leaflet.  Coronary angiography in 6/17 showed patent grafts and 90% stenosis of distal diagonal not amenable to intervention.  In 8/17, he had mitral valve repair.  Post-op diastolic CHF, Lasix  started and increased.   He had a cath again in 9/18 showing stable coronary anatomy.    He developed atrial arrhythmias in 12/18.  Both fibrillation and flutter were seen.  Saw Dr. Kelsie, recommended DCCV with amiodarone  or sotalol if fibrillation recurs.  He had DCCV in 1/19.   In 6/19, he was noted to have developed AKI. Creatinine went up to 3.46.  Renal US  was done, showing normal kidneys. Cause of AKI was not clear.  We stopped his Lasix . He saw nephrology and eventually went back on Lasix , currently taking 80 mg daily.  He is not using any NSAIDs.    He stopped pravastatin  due to myalgias. We struggled to get him on a PCSK9 inhibitor through the [facility] but he was finally able to get Praluent .    He had a Lexiscan  Cardiolite  in 11/19, which showed old inferior MI with no ischemia.  Echo in 11/19 showed EF 55-60%, s/p MV repair with mean gradient 6 mmHg. Echo 12/20 showed EF 50-55%, mildly decreased RV systolic function, repaired mitral valve with mild MR, mean gradient 6 mmHg across MV.    He had DCCV from atrial fibrillation to NSR in 1/21 and again in 2/21.   At last appointment, he reported significant fatigue and dyspnea in  setting of weight loss with low BP and HR.  I stopped his Coreg . He felt better with that change.    In 9/21, he had COVID-19 infection but was not hospitalized.   Zio patch in 3/22 showed frequent short atrial tachycardia episodes.  He saw Dr. Kelsie, medical therapy recommended (on amiodarone ).   He was admitted in 5/22 with AKI from obstruction of right ureter by stone, he was treated by urology.   He was admitted in 6/22 with right-sided weakness and was found to have left cerebral white matter CVA.  He had been compliant with Eliquis , ASA 81 was added.   He was admitted in 7/22 with Enterococcal bacteremia.  TEE showed oscillating density attached to the mitral annulus, ?suture.  However, could not rule out vegetation so had 6 wks of IV abx for SBE.   Echo in 8/22 showed EF 55-60%, moderate focal basal septal hypertrophy, normal RV, severe LAE, s/p mitral valve repair with mean gradient 8 mmHg (mild to moderate mitral stenosis) and mild MR, large PFO, normal IVC.   Patient was noted to re-develop atrial fibrillation.  TEE was done in 8/23, LV EF was 55% with mild RV enlargement and normal RV function, s/p MV repair with mean gradient 10 mmHg but MVA 2.19 cm^2 (leaflets open well), trivial MR.  He was cardioverted back to NSR.   Follow up  6/24, he was in rate controlled AF, for about a month. Zio monitor in 6/24 showed continuous AF. Endorsed worsening NYHA III symptoms and volume overloaded. Lasix  increased to 80/40. Arranged for DCCV on 02/22/23, successful to NSR.  Unfortunately, at return f/u he was back in AF and felt poor.  More fatigued and worn out but not markedly short of breath.  ReDs 33%. He was arranged for repeat DCCV, done on 03/12/23. Converted back to NSR. He again went back into AF despite amiodarone  use.   Seen by EP, not candidate for ablation, though AF was now permanent and stopped amiodarone /started Toprol  XL.   Echo 7/25 showed EF 55-60%, RV moderately reduced, normal  prosthetic mitral valve, IVC dilated.  Here today for f/u d/t worsening fatigue/weakness.  His wife is present and assists with the history. Patient gets fatigued and short of breath with minimal exertion. His weight has been slowly trending down over the last year. He is down about 10 lb on home scale since last visit, ~ 4 lb in clinic. He doesn't have much appetite, wife states this is chronic but seems worse the last coupe of months. His endocrinologist recently reduced his ozempic . No orthopnea or PND. Has intermittent abdominal bloating which usually improves after he takes Linzess.   Occasionally gets sharp left sided chest pains that resolve within a few seconds of onset. This has occurred on a chronic basis. Reports symptoms are different than prior angina.  Taking all medications including diuretics as prescribed. He is faithful with CPAP.    PMH: 1. OSA: on CPAP.  2. Diabetes mellitus 3. Allergic rhinitis 4. CAD: s/p CABG 1995.  LHC (10/11) with 90% distal D1 (patent proximal D1 stent), total occlusion CFX, total occlusion RCA, no significant stenoses in LAD, SVG-PDA patent, SVG-OM patent, EF 55%.  Medical management.  LHC (5/13) with patent grafts and 90% distal D1 stenosis (no significant change from 10/11).  LHC (1/15) with patent grafts, 75% ISR in D1 stent, 90% stenosis distal D1 (small caliber). D1 not amenable to intervention.  - Coronary angiography (6/17): grafts patent, 90% stenosis distally in large diagonal not amenable to intervention.   - LHC (9/18): SVG-OM and SVG-PDA patent,  75% in-stent restenosis in proximal diagonal then 90% stenosis distally (small in caliber at that point), totally occluded LCx and RCA.  Left coronary system similar to prior caths, D1 may be source of angina.  - Lexiscan  Cardiolite  (11/19): EF 47%, old inferior MI, no ischemia.  5. Obesity: lap-banding in 5/11.  6. Chronic diastolic CHF: Echo (8/17) with EF 55-60%, moderate LVH, moderate diastolic  dysfunction, s/p mitral valve repair with elevated mean gradient but normal pressure half-time.  - Echo (8/18): EF 55-60%, s/p MV repair with mean gradient 6 mmHg, PASP 28 mmHg, mildly decreased RV systolic function.  - Echo (11/19): EF 55-60%, s/p MV repair with mean gradient 6, PASP 42 mmHg, mildly dilated RV, dilated IVC.  - Echo (12/20): EF 50-55%, mildly decreased RV systolic function. S/p MV repair with mean gradient 6 mmHg, mild MR.  - Echo (2/22): EF 45-50%, mild LVH, moderate RV enlargement with low normal systolic function, s/p mitral valve repair with mean gradient 5 mmHg and trivial MR, mild-moderate AS.  - Echo (8/22): EF 55-60%, moderate focal basal septal hypertrophy, normal RV, severe LAE, s/p mitral valve repair with mean gradient 8 mmHg (mild to moderate mitral stenosis) and mild MR, large PFO, normal IVC. - TEE (8/23): LV EF was 55% with mild RV  enlargement and normal RV function, s/p MV repair with mean gradient 10 mmHg but MVA 2.19 cm^2 (leaflets open well), trivial MR. - Echo (7/25): EF 55-60%, RV moderately reduced, normal prosthetic mitral valve, IVC dilated. 7. PFO: noted on 8/22 echo.  8. OA: Knees, C-spine. TKR 9/13.  9. HTN 10. Hyperlipidemia: Myalgias with atorvastatin  and Crestor . Now on Praluent .  11. Diverticulosis 12. Mitral regurgitation: TEE (6/17) with EF 55-60%, severe eccentric MR, prolapse of a portion of the anterior leaflet, peak RV-RA gradient 45 mmHg.  - Mitral valve repair 8/17 - Echo in 8/22 with repaired MV, mean gradient 8 mmHg (mild-moderate MS), mild MR.  - TEE in 8/23 with MV repair with mean gradient 10 mmHg but MVA 2.19 cm^2 (leaflets open well), trivial MR. 13. CKD: Stage III.  14. Atrial fibrillation/atrial flutter: Both arrhythmias noted in 12/18.  DCCV 12/19.   - DCCV to NSR 1/21.  - DCCV to NSR 2/21.  - DCCV to NSR in 8/23 - DCCV to NSR 7/24 - Now permanent 15. AKI 6/19 => CKD stage 4: uncertain etiology.  Renal US  was normal.  16.  Gout 17. COVID-19 infection 9/21 18. 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  - Carotid dopplers (6/22) with mild disease only.  19. Nephrolithiasis.  20. Atrial tachycardia: Noted on 3/22 Zio patch.  21. Aortic stenosis: mild-moderate on 2/22 echo but no significant stenosis on 8/22 echo.  - No AS on TEE in 8/23.  22. 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  TEE 7/22 with oscillating density attached to the mitral annulus, ?suture.  However, could not rule out vegetation so had 6 wks of IV abx for SBE. 23. 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  Lives in [place].   Family History  Problem Relation Age of Onset   Other Mother        joint problems   Alzheimer's disease Mother    Hypertension Father    Heart disease Father    CVA Father        age 31   Depression Father    Heart disease Sister        CABG   Stroke Sister    Heart failure Sister        congestive   Diabetes Sister    Heart disease Brother    Hypertension Brother    Congestive Heart Failure Brother     ROS: All systems reviewed and negative except as per HPI.   Current Outpatient Medications  Medication Sig Dispense Refill   albuterol  (VENTOLIN  HFA) 108 (90 Base) MCG/ACT inhaler Inhale 1-2 puffs into the lungs every 6 (six) hours as needed for shortness of breath or wheezing.     Alirocumab  (PRALUENT ) 75 MG/ML SOAJ Inject 75 mg into the skin every 14 (fourteen) days.     allopurinol  (ZYLOPRIM ) 100 MG tablet Take 50 mg by mouth 2 (two) times a week.     apixaban  (ELIQUIS ) 5 MG TABS tablet Take 1 tablet (5 mg total) by mouth 2 (two) times daily. 180 tablet 3   busPIRone (BUSPAR) 10 MG tablet Take 5 mg by mouth 2 (two) times daily.     cetirizine (ZYRTEC) 10 MG tablet Take 10 mg by mouth in the morning.     colchicine  0.6 MG tablet Take 0.6-1.2 mg by mouth 2 (two) times daily as needed (gout).     cycloSPORINE (RESTASIS) 0.05 % ophthalmic  emulsion Place 1 drop into both  eyes 2 (two) times daily as needed (eye irritation.).     diclofenac  Sodium (VOLTAREN ) 1 % GEL Apply 2 g topically 3 (three) times daily as needed for pain.     docusate sodium  (COLACE) 100 MG capsule Take 200 mg by mouth at bedtime.     ezetimibe  (ZETIA ) 10 MG tablet Take 1 tablet (10 mg total) by mouth daily. 90 tablet 3   fluticasone  (FLONASE ) 50 MCG/ACT nasal spray Place 1 spray into both nostrils daily as needed for allergies or rhinitis.     HYDROcodone -acetaminophen  (NORCO) 10-325 MG tablet Take 1 tablet by mouth 3 (three) times daily as needed for moderate pain.     Meclizine HCl 25 MG CHEW Chew 25 mg by mouth 3 (three) times daily as needed (dizziness).     methocarbamol  (ROBAXIN ) 500 MG tablet Take 500 mg by mouth 3 (three) times daily as needed for muscle spasms.     pantoprazole  (PROTONIX ) 40 MG tablet Take 40 mg by mouth daily as needed (heartburn).     potassium chloride  SA (KLOR-CON  M) 20 MEQ tablet Take 1 tablet (20 mEq total) by mouth daily. 90 tablet 3   Semaglutide ,0.25 or 0.5MG /DOS, (OZEMPIC , 0.25 OR 0.5 MG/DOSE,) 2 MG/1.5ML SOPN Inject 0.5 mg into the skin once a week. 9 mL 3   tadalafil (CIALIS) 20 MG tablet Take 20 mg by mouth daily as needed for erectile dysfunction.     testosterone  cypionate (DEPOTESTOSTERONE CYPIONATE) 200 MG/ML injection Inject 200 mg into the muscle once a week.     traZODone  (DESYREL ) 100 MG tablet Take 100 mg by mouth at bedtime.     Blood Glucose Monitoring Suppl (ONETOUCH VERIO REFLECT) w/Device KIT Use as advised (Patient not taking: Reported on 06/08/2024) 1 kit 0   Continuous Glucose Sensor (FREESTYLE LIBRE 3 PLUS SENSOR) MISC 1 each by Does not apply route every 14 (fourteen) days. (Patient not taking: Reported on 06/08/2024) 6 each 3   Cyanocobalamin (VITAMIN B-12) 5000 MCG TBDP Take 5,000 mcg by mouth 2 (two) times a week. (Patient not taking: Reported on 06/08/2024)     glipiZIDE  (GLUCOTROL ) 5 MG tablet Take  0.5-1 tablets (2.5-5 mg total) by mouth daily before supper. (Patient not taking: Reported on 06/08/2024) 60 tablet 3   insulin  glargine (LANTUS SOLOSTAR) 100 UNIT/ML Solostar Pen Inject 8-10 Units into the skin daily. (Patient not taking: Reported on 06/08/2024) 15 mL PRN   Insulin  Pen Needle 32G X 4 MM MISC Use 1x a day (Patient not taking: Reported on 06/08/2024) 100 each 3   nitroGLYCERIN  (NITROSTAT ) 0.4 MG SL tablet 1 TAB UNDER THE TONGUE EVERY 5 MINUTES AS NEEDED FOR CHEST PAIN UP TO 3 DOSES, IF PERSIST CALL 911 25 tablet 3   ONETOUCH VERIO test strip 1 each by Other route in the morning and at bedtime. (Patient not taking: Reported on 06/08/2024) 200 each 5   torsemide (DEMADEX) 20 MG tablet Take 3 tablets (60 mg total) by mouth 2 (two) times daily. 200 tablet 3   No current facility-administered medications for this encounter.   BP (!) 118/58   Pulse 85   Ht 5' 10 (1.778 m)   Wt 80.6 kg (177 lb 9.6 oz) Comment: home weight (170lbs)  SpO2 90%   BMI 25.48 kg/m    Wt Readings from Last 3 Encounters:  06/08/24 80.6 kg (177 lb 9.6 oz)  06/05/24 78.9 kg (174 lb)  03/24/24 82.1 kg (181 lb)   PHYSICAL EXAM: General:  Elderly male.  No distress. Neck: JVP midneck with HJR Cor: Irregular rhythm. No murmurs. Lungs: clear Abdomen: soft, nontender, nondistended.  Extremities: trace edema, venous stasis changes Neuro: alert & orientedx3. Affect pleasant  ReDS 37%, elevated   Assessment/Plan: 1. Chronic diastolic CHF:  TEE in 8/23 with EF 55%, mild RV enlargement with normal RV function.  Echo 7/24 EF 55-60%. RV mildly reduced.  Echo 7/25 showed EF 55-60%, RV moderately reduced, normal prosthetic mitral valve, IVC dilated. CHF is worsened by CKD stage IV and persistent AF. NYHA class IIIb primarily due to fatigue.  Reports slightly worse symptoms the last couple of months. Of some concern is his decrease appetite and weight loss. His endocrinologist recently cut back Ozempic . Volume up on  exam and ReDS elevated 37%. Will stop lasix  and start torsemide 80 BID X 2 days then reduce to 60 BID. If does not improve with increased diuretics may be headed towards HD. May need to consider RHC to rule out low-output RV failure. - CMET/BNP today 2. S/p mitral valve repair:  Echo in 8/22 showed mild-moderate stenosis with mean gradient 8 mmHg and mild MR.  TEE in 8/23 showed MV repair with mean gradient 10 mmHg but MVA 2.19 cm^2 (leaflets open well), trivial MR.  Echo 7/24 mild MR, mild MS, mean gradient 7.0. Echo 7/25 showed stable prosthetic mitral valve with mild MR and mild to moderate MS, mean gradient 8.4 mmHg. - With prosthetic material, should use antibiotic prophylaxis with dental work.  3. Atrial fibrillation: Now permanent. s/p MAZE w/ recurrence.  ERAF with back to back cardioversions in 7/24 despite amiodarone .  Saw EP, not thought to be candidate for ablation.  Therefore, amiodarone  stopped and patient started on Toprol  XL for rate control of atrial fibrillation.  Patient felt marked fatigue since starting Toprol  XL.  - He has stopped his Toprol  XL to see if fatigue improved; he endorese some improvement with fatigue.  - Continue apixaban  5 mg bid.  4. CAD:  He had severe stenosis in a distal D1 that is not amenable to intervention.  This has been stable on last couple of caths. He has occluded RCA and LCx known from the past with SVGs to OM and PDA.  Echo in 2/22 with EF 45-50%. Cardiolite  in 11/19 showed no ischemia. Echo in 8/22 with EF up to 55-60%, TEE in 8/23 with EF 55%.  Has chronic atypical chest pain. Denies symptoms similar to prior angina. - Currently not on ASA, taking apixaban .  - He cannot tolerate statins with myalgias, Now on Praluent  with excellent LDL  5. Hyperlipidemia: Myalgias with Crestor , pravastatin , and atorvastatin .  We were finally able to get him Praluent  through the [facility]. He is also on Zetia .  6. OSA: Compliant w/ CPAP.  7. 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  He sees nephrology, there is concern that he is progressing towards HD. He now has AV fistula.  - Labs today - Reports upcoming follow-up with Nephrology, Dr. Norine. 8. Diabetes: He is now followed by endocrinology, Dr. Vianne - Recent A1c 7.9% 9. CVA: Though to be atrial fibrillation-related.  Carotid dopplers 6/22 with mild stenosis only. However, he was also noted on 8/22 echo to have a PFO.  - Continue on Eliquis .  10. Enterococcal bacteremia: In 7/22.  On TEE, it was hard to differentiate between loose suture material and small vegetation.  He was treated for 6 wks with IV abx.   Follow-up: 3-4 weeks with APP to assess volume  Rosia Syme N PA-C 06/08/2024

## 2024-06-08 NOTE — Telephone Encounter (Addendum)
 Pt aware, agreeable, and verbalized understanding  Labs ordered and med list update   ----- Message from [facility], MANUELITA N sent at 06/08/2024 12:37 PM EDT ----- Kidney function stable but K is low. Please confirm he has been taking 20 mEq potassium daily. Would have him take an extra 40 mEq today then increase dose to 40 mEq daily. Repeat BMET in 1 week. ----- Message ----- From: Interface, Lab In [place] Sent: 06/08/2024  12:00 PM EDT To: MANUELITA Nat Dutch, PA-C

## 2024-06-09 ENCOUNTER — Telehealth: Payer: Self-pay | Admitting: Internal Medicine

## 2024-06-09 NOTE — Telephone Encounter (Signed)
 Patient's wife,Susan Lucia, is calling to say that they only received the prescriptions for Ozempic  Lantus, and the insulin  needles.  [name] states that they have not received the FreeStyle [place] 3 plus sensors and they are not sure what they should do.  [name] states she wants a call back today.

## 2024-06-12 MED ORDER — FREESTYLE LIBRE 3 PLUS SENSOR MISC: 1.0000 | 3 refills | Status: AC

## 2024-06-12 NOTE — Addendum Note (Signed)
 Encounter addended by: Dante Jeannine HERO, CMA on: 06/12/2024 1:53 PM  Actions taken: Charge Capture section accepted

## 2024-06-12 NOTE — Telephone Encounter (Signed)
New Rx has been sent

## 2024-06-16 ENCOUNTER — Ambulatory Visit (HOSPITAL_COMMUNITY): Payer: Self-pay | Admitting: Physician Assistant

## 2024-06-16 ENCOUNTER — Other Ambulatory Visit (HOSPITAL_COMMUNITY): Payer: Self-pay | Admitting: Physician Assistant

## 2024-06-16 ENCOUNTER — Ambulatory Visit (HOSPITAL_COMMUNITY)
Admission: RE | Admit: 2024-06-16 | Discharge: 2024-06-16 | Disposition: A | Discharge status: Home / Self Care | Source: Ambulatory Visit | Attending: Cardiology | Admitting: Cardiology

## 2024-06-16 DIAGNOSIS — I5032 Chronic diastolic (congestive) heart failure: Secondary | ICD-10-CM | POA: Insufficient documentation

## 2024-06-16 LAB — BASIC METABOLIC PANEL WITH GFR
Anion gap: 12 (ref 5–15)
BUN: 47 mg/dL — ABNORMAL HIGH (ref 8–23)
CO2: 20 mmol/L — ABNORMAL LOW (ref 22–32)
Calcium: 9.8 mg/dL (ref 8.9–10.3)
Chloride: 105 mmol/L (ref 98–111)
Creatinine, Ser: 4.3 mg/dL — ABNORMAL HIGH (ref 0.61–1.24)
GFR, Estimated: 13 mL/min — ABNORMAL LOW (ref >60)
Glucose, Bld: 177 mg/dL — ABNORMAL HIGH (ref 70–99)
Potassium: 3.7 mmol/L (ref 3.5–5.1)
Sodium: 137 mmol/L (ref 135–145)

## 2024-07-05 NOTE — Progress Notes (Signed)
 Patient ID: [name], male   DOB: 1946/07/15, 78 y.o.   MRN: [id number] PCP: Dr Okey Cardiology: Dr. Rolan Nephrology: Dr. Norine  EP: Dr. Nancey  [name] is a 78 y.o. with history of CAD s/p CABG and obesity s/p lap banding procedure.  Given exertional dyspnea and chest tightness, he had LHC in 1/15.  This showed no change from prior.  Grafts were patent and there was severe stenosis in distal D1, not amenable to PCI.   In 2017, he developed increased exertional dyspnea.  Eventually had a TEE showing severe Jason with prolapse of a portion of the anterior mitral valve leaflet.  Coronary angiography in 6/17 showed patent grafts and 90% stenosis of distal diagonal not amenable to intervention.  In 8/17, he had mitral valve repair.  Post-op diastolic CHF, Lasix  started and increased.   He had a cath again in 9/18 showing stable coronary anatomy.    He developed atrial arrhythmias in 12/18.  Both fibrillation and flutter were seen.  Saw Dr. Kelsie, recommended DCCV with amiodarone  or sotalol if fibrillation recurs.  He had DCCV in 1/19.   In 6/19, he was noted to have developed AKI. Creatinine went up to 3.46.  Renal US  was done, showing normal kidneys. Cause of AKI was not clear.  We stopped his Lasix . He saw nephrology and eventually went back on Lasix , currently taking 80 mg daily.  He is not using any NSAIDs.    He stopped pravastatin  due to myalgias. We struggled to get him on a PCSK9 inhibitor through the [place] but he was finally able to get Praluent .    He had a Lexiscan  Cardiolite  in 11/19, which showed old inferior MI with no ischemia.  Echo in 11/19 showed EF 55-60%, s/p MV repair with mean gradient 6 mmHg. Echo 12/20 showed EF 50-55%, mildly decreased RV systolic function, repaired mitral valve with mild Jason, mean gradient 6 mmHg across MV.    He had DCCV from atrial fibrillation to NSR in 1/21 and again in 2/21.   At last appointment, he reported significant fatigue and dyspnea in  setting of weight loss with low BP and HR.  I stopped his Coreg . He felt better with that change.    In 9/21, he had COVID-19 infection but was not hospitalized.   Zio patch in 3/22 showed frequent short atrial tachycardia episodes.  He saw Dr. Kelsie, medical therapy recommended (on amiodarone ).   He was admitted in 5/22 with AKI from obstruction of right ureter by stone, he was treated by urology.   He was admitted in 6/22 with right-sided weakness and was found to have left cerebral white matter CVA.  He had been compliant with Eliquis , ASA 81 was added.   He was admitted in 7/22 with Enterococcal bacteremia.  TEE showed oscillating density attached to the mitral annulus, ?suture.  However, could not rule out vegetation so had 6 wks of IV abx for SBE.   Echo in 8/22 showed EF 55-60%, moderate focal basal septal hypertrophy, normal RV, severe LAE, s/p mitral valve repair with mean gradient 8 mmHg (mild to moderate mitral stenosis) and mild Jason, large PFO, normal IVC.   Patient was noted to re-develop atrial fibrillation.  TEE was done in 8/23, LV EF was 55% with mild RV enlargement and normal RV function, s/p MV repair with mean gradient 10 mmHg but MVA 2.19 cm^2 (leaflets open well), trivial Jason.  He was cardioverted back to NSR.   Follow up  6/24, he was in rate controlled AF, for about a month. Zio monitor in 6/24 showed continuous AF. Endorsed worsening NYHA III symptoms and volume overloaded. Lasix  increased to 80/40. Arranged for DCCV on 02/22/23, successful to NSR.  Unfortunately, at return f/u he was back in AF and felt poor.  More fatigued and worn out but not markedly short of breath.  ReDs 33%. He was arranged for repeat DCCV, done on 03/12/23. Converted back to NSR. He again went back into AF despite amiodarone  use.   Seen by EP, not candidate for ablation, though AF was now permanent and stopped amiodarone /started Toprol  XL.   Echo 7/25 showed EF 55-60%, RV moderately reduced, normal  prosthetic mitral valve, IVC dilated.  Today he returns for HF follow up with his wife. Overall feeling fine. Mild SOB walking walking further distances on flat ground. Wife says he gets tired. Urinates briskly on current dose of torsemide. Denies palpitations, abnormal bleeding, CP, dizziness, edema, or PND/Orthopnea. Appetite ok. Weight at home 170 pounds. Taking all medications. Wears CPAP.  ReDs reading: 32%, normal   ECG (personally reviewed): none ordered today.   Labs (4/24): K 3.3, creatinine 4.22, LFTs normal, TSH normal Labs (6/24): K 4.0, creatinine 4.32, LFTs normal, TSH normal, LDL 23, HDL 46 Labs (7/24): K 3.5, creatinine 4.42 Labs (04/08/23): K 3.5, creatinine 4.27, BNP 294 Labs (1/25): K 3.6, creatinine 4.28 Labs (5/25): K 3.5, creatinine 4.22, LDL 20 Labs (10/25): K 3.7, creatinine 4.30   PMH: 1. OSA: on CPAP.  2. Diabetes mellitus 3. Allergic rhinitis 4. CAD: s/p CABG 1995.  LHC (10/11) with 90% distal D1 (patent proximal D1 stent), total occlusion CFX, total occlusion RCA, no significant stenoses in LAD, SVG-PDA patent, SVG-OM patent, EF 55%.  Medical management.  LHC (5/13) with patent grafts and 90% distal D1 stenosis (no significant change from 10/11).  LHC (1/15) with patent grafts, 75% ISR in D1 stent, 90% stenosis distal D1 (small caliber). D1 not amenable to intervention.  - Coronary angiography (6/17): grafts patent, 90% stenosis distally in large diagonal not amenable to intervention.   - LHC (9/18): SVG-OM and SVG-PDA patent,  75% in-stent restenosis in proximal diagonal then 90% stenosis distally (small in caliber at that point), totally occluded LCx and RCA.  Left coronary system similar to prior caths, D1 may be source of angina.  - Lexiscan  Cardiolite  (11/19): EF 47%, old inferior MI, no ischemia.  5. Obesity: lap-banding in 5/11.  6. Chronic diastolic CHF: Echo (8/17) with EF 55-60%, moderate LVH, moderate diastolic dysfunction, s/p mitral valve repair with  elevated mean gradient but normal pressure half-time.  - Echo (8/18): EF 55-60%, s/p MV repair with mean gradient 6 mmHg, PASP 28 mmHg, mildly decreased RV systolic function.  - Echo (11/19): EF 55-60%, s/p MV repair with mean gradient 6, PASP 42 mmHg, mildly dilated RV, dilated IVC.  - Echo (12/20): EF 50-55%, mildly decreased RV systolic function. S/p MV repair with mean gradient 6 mmHg, mild Jason.  - Echo (2/22): EF 45-50%, mild LVH, moderate RV enlargement with low normal systolic function, s/p mitral valve repair with mean gradient 5 mmHg and trivial Jason, mild-moderate AS.  - Echo (8/22): EF 55-60%, moderate focal basal septal hypertrophy, normal RV, severe LAE, s/p mitral valve repair with mean gradient 8 mmHg (mild to moderate mitral stenosis) and mild Jason, large PFO, normal IVC. - TEE (8/23): LV EF was 55% with mild RV enlargement and normal RV function, s/p MV repair with mean gradient  10 mmHg but MVA 2.19 cm^2 (leaflets open well), trivial Jason. - Echo (7/25): EF 55-60%, RV moderately reduced, normal prosthetic mitral valve, IVC dilated. 7. PFO: noted on 8/22 echo.  8. OA: Knees, C-spine. TKR 9/13.  9. HTN 10. Hyperlipidemia: Myalgias with atorvastatin  and Crestor . Now on Praluent .  11. Diverticulosis 12. Mitral regurgitation: TEE (6/17) with EF 55-60%, severe eccentric Jason, prolapse of a portion of the anterior leaflet, peak RV-RA gradient 45 mmHg.  - Mitral valve repair 8/17 - Echo in 8/22 with repaired MV, mean gradient 8 mmHg (mild-moderate MS), mild Jason.  - TEE in 8/23 with MV repair with mean gradient 10 mmHg but MVA 2.19 cm^2 (leaflets open well), trivial Jason. 13. CKD: Stage III.  14. Atrial fibrillation/atrial flutter: Both arrhythmias noted in 12/18.  DCCV 12/19.   - DCCV to NSR 1/21.  - DCCV to NSR 2/21.  - DCCV to NSR in 8/23 - DCCV to NSR 7/24 - Now permanent 15. AKI 6/19 => CKD stage 4: uncertain etiology.  Renal US  was normal.  16. Gout 17. COVID-19 infection 9/21 18. 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  - Carotid dopplers (6/22) with mild disease only.  19. Nephrolithiasis.  20. Atrial tachycardia: Noted on 3/22 Zio patch.  21. Aortic stenosis: mild-moderate on 2/22 echo but no significant stenosis on 8/22 echo.  - No AS on TEE in 8/23.  22. 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  TEE 7/22 with oscillating density attached to the mitral annulus, ?suture.  However, could not rule out vegetation so had 6 wks of IV abx for SBE. 23. Partial SBO  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  Lives in [place].   Family History  Problem Relation Age of Onset   Other Mother        joint problems   Alzheimer's disease Mother    Hypertension Father    Heart disease Father    CVA Father        age 63   Depression Father    Heart disease Sister        CABG   Stroke Sister    Heart failure Sister        congestive   Diabetes Sister    Heart disease Brother    Hypertension Brother    Congestive Heart Failure Brother    ROS: All systems reviewed and negative except as per HPI.   Current Outpatient Medications  Medication Sig Dispense Refill   albuterol  (VENTOLIN  HFA) 108 (90 Base) MCG/ACT inhaler Inhale 1-2 puffs into the lungs every 6 (six) hours as needed for shortness of breath or wheezing.     Alirocumab  (PRALUENT ) 75 MG/ML SOAJ Inject 75 mg into the skin every 14 (fourteen) days.     allopurinol  (ZYLOPRIM ) 100 MG tablet Take 50 mg by mouth 2 (two) times a week.     apixaban  (ELIQUIS ) 5 MG TABS tablet Take 1 tablet (5 mg total) by mouth 2 (two) times daily. 180 tablet 3   Blood Glucose Monitoring Suppl (ONETOUCH VERIO REFLECT) w/Device KIT Use as advised 1 kit 0   busPIRone (BUSPAR) 10 MG tablet Take 5 mg by mouth 2 (two) times daily.     cetirizine (ZYRTEC) 10 MG tablet Take 10 mg by mouth in the morning.     colchicine  0.6 MG tablet Take 0.6-1.2 mg by mouth 2 (two) times daily as needed  (gout).     Continuous Glucose Sensor (FREESTYLE LIBRE 3  PLUS SENSOR) MISC Inject 1 Device into the skin continuous. Change every 15 days 6 each 3   Cyanocobalamin (VITAMIN B-12) 5000 MCG TBDP Take 5,000 mcg by mouth 2 (two) times a week.     cycloSPORINE (RESTASIS) 0.05 % ophthalmic emulsion Place 1 drop into both eyes 2 (two) times daily as needed (eye irritation.).     diclofenac  Sodium (VOLTAREN ) 1 % GEL Apply 2 g topically 3 (three) times daily as needed for pain.     docusate sodium  (COLACE) 100 MG capsule Take 200 mg by mouth at bedtime.     ezetimibe  (ZETIA ) 10 MG tablet Take 1 tablet (10 mg total) by mouth daily. 90 tablet 3   fluticasone  (FLONASE ) 50 MCG/ACT nasal spray Place 1 spray into both nostrils daily as needed for allergies or rhinitis.     glipiZIDE  (GLUCOTROL ) 5 MG tablet Take 0.5-1 tablets (2.5-5 mg total) by mouth daily before supper. (Patient taking differently: Take 2.5-5 mg by mouth daily as needed.) 60 tablet 3   HYDROcodone -acetaminophen  (NORCO) 10-325 MG tablet Take 1 tablet by mouth 3 (three) times daily as needed for moderate pain.     insulin  glargine (LANTUS SOLOSTAR) 100 UNIT/ML Solostar Pen Inject 8-10 Units into the skin daily. 15 mL PRN   Insulin  Pen Needle 32G X 4 MM MISC Use 1x a day 100 each 3   Meclizine HCl 25 MG CHEW Chew 25 mg by mouth 3 (three) times daily as needed (dizziness).     methocarbamol  (ROBAXIN ) 500 MG tablet Take 500 mg by mouth 3 (three) times daily as needed for muscle spasms.     nitroGLYCERIN  (NITROSTAT ) 0.4 MG SL tablet 1 TAB UNDER THE TONGUE EVERY 5 MINUTES AS NEEDED FOR CHEST PAIN UP TO 3 DOSES, IF PERSIST CALL 911 25 tablet 3   ONETOUCH VERIO test strip 1 each by Other route in the morning and at bedtime. 200 each 5   pantoprazole  (PROTONIX ) 40 MG tablet Take 40 mg by mouth daily as needed (heartburn).     potassium chloride  SA (KLOR-CON  M) 20 MEQ tablet Take 2 tablets (40 mEq total) by mouth daily. 90 tablet 3   Semaglutide ,0.25 or  0.5MG /DOS, (OZEMPIC , 0.25 OR 0.5 MG/DOSE,) 2 MG/1.5ML SOPN Inject 0.5 mg into the skin once a week. 9 mL 3   tadalafil (CIALIS) 20 MG tablet Take 20 mg by mouth daily as needed for erectile dysfunction.     testosterone  cypionate (DEPOTESTOSTERONE CYPIONATE) 200 MG/ML injection Inject 200 mg into the muscle once a week.     torsemide (DEMADEX) 20 MG tablet Take 3 tablets (60 mg total) by mouth 2 (two) times daily. (Patient taking differently: Take 60 mg by mouth 2 (two) times daily. 60 mg in the AM and 40 mg in the PM) 200 tablet 3   traZODone  (DESYREL ) 100 MG tablet Take 100 mg by mouth at bedtime.     No current facility-administered medications for this encounter.   BP 118/60   Pulse 75   Ht 5' 10.5 (1.791 m)   Wt 79.7 kg (175 lb 12.8 oz)   SpO2 94%   BMI 24.87 kg/m    Wt Readings from Last 3 Encounters:  07/11/24 79.7 kg (175 lb 12.8 oz)  06/08/24 80.6 kg (177 lb 9.6 oz)  06/05/24 78.9 kg (174 lb)   PHYSICAL EXAM: General:  NAD. No resp difficulty, walked into clinic, elderly HEENT: Normal Neck: Supple. No JVD. Cor: Irregular rate & rhythm. No rubs, gallops or murmurs.  Lungs: Clear Abdomen: Soft, nontender, nondistended.  Extremities: No cyanosis, clubbing, rash, edema Neuro: Alert & oriented x 3, moves all 4 extremities w/o difficulty. Affect pleasant.  Assessment/Plan: 1. Chronic diastolic CHF:  TEE in 8/23 with EF 55%, mild RV enlargement with normal RV function.  Echo 7/24 EF 55-60%. RV mildly reduced.  Echo 7/25 showed EF 55-60%, RV moderately reduced, normal prosthetic mitral valve, IVC dilated. NYHA class IIb-early III. He is not volume overloaded by exam, ReDs normal at 32%. - CHF is worsened by CKD stage IV and persistent AF.  - Continue torsemide 80 mg q am/60 mg qpm + 40 KCL daily. 2. S/p mitral valve repair:  Echo in 8/22 showed mild-moderate stenosis with mean gradient 8 mmHg and mild Jason.  TEE in 8/23 showed MV repair with mean gradient 10 mmHg but MVA 2.19 cm^2  (leaflets open well), trivial Jason.  Echo 7/24 mild Jason, mild MS, mean gradient 7.0. Echo 7/25 showed stable prosthetic mitral valve with mild Jason and mild to moderate MS, mean gradient 8.4 mmHg. - With prosthetic material, should use antibiotic prophylaxis with dental work.  3. Atrial fibrillation: Now permanent. s/p MAZE w/ recurrence.  ERAF with back to back cardioversions in 7/24 despite amiodarone .  Saw EP, not thought to be candidate for ablation.  Therefore, amiodarone  stopped and patient started on Toprol  XL for rate control of atrial fibrillation.  Patient felt marked fatigue since starting Toprol  XL.  - He has stopped his Toprol  XL to see if fatigue improved; has noticed modest improvement. - Continue apixaban  5 mg bid. No bleeding issues. Check CBC and iron panel. 4. CAD:  He had severe stenosis in a distal D1 that is not amenable to intervention.  This has been stable on last couple of caths. He has occluded RCA and LCx known from the past with SVGs to OM and PDA.  Echo in 2/22 with EF 45-50%. Cardiolite  in 11/19 showed no ischemia. Echo in 8/22 with EF up to 55-60%, TEE in 8/23 with EF 55%.  No chest pain. - Currently not on ASA, taking apixaban .  - He cannot tolerate statins with myalgias, Now on Praluent  with excellent LDL  5. Hyperlipidemia: Myalgias with Crestor , pravastatin , and atorvastatin .  We were finally able to get him Praluent  through the [facility]. He is also on Zetia .  6. OSA: Compliant w/ CPAP.  7. CKD stage 4: AKI without recovery, uncertain etiology.  He sees nephrology, there is concern that he is progressing towards HD. He now has AV fistula.  - Follows with Nephrology, Dr. Norine. 8. Diabetes: He is now followed by endocrinology, Dr. Vianne - Recent A1c 7.9% 9. CVA: Though to be atrial fibrillation-related.  Carotid dopplers 6/22 with mild stenosis only. However, he was also noted on 8/22 echo to have a PFO.  - Continue on Eliquis .  10. Enterococcal bacteremia: In 7/22.  On TEE,  it was hard to differentiate between loose suture material and small vegetation.  He was treated for 6 wks with IV abx.  11. Fatigue: likely multifactorial. Compliant with CPAP - Check TSH and iron panel.  Follow up in 6 months with Dr. Rolan Raisin [facility] FNP-BC 07/11/2024

## 2024-07-10 ENCOUNTER — Telehealth (HOSPITAL_COMMUNITY): Payer: Self-pay

## 2024-07-10 NOTE — Telephone Encounter (Signed)
 Called and spoke to pt's wife Devere to confirm/remind patient of their appointment at [facility] on 07/11/24.   Appointment:   [x] Confirmed  [] Left mess   [] No answer/No voice mail  [] VM Full/unable to leave message  [] Phone not in service  Patient reminded to bring all medications and/or complete list.  Confirmed patient has transportation. Gave directions, instructed to utilize valet parking.

## 2024-07-11 ENCOUNTER — Ambulatory Visit (HOSPITAL_COMMUNITY): Payer: Self-pay | Admitting: Family Medicine

## 2024-07-11 ENCOUNTER — Ambulatory Visit (HOSPITAL_COMMUNITY)
Admission: RE | Admit: 2024-07-11 | Discharge: 2024-07-11 | Disposition: A | Discharge status: Home / Self Care | Source: Ambulatory Visit | Attending: Family Medicine | Admitting: Family Medicine

## 2024-07-11 ENCOUNTER — Encounter (HOSPITAL_COMMUNITY): Payer: Self-pay

## 2024-07-11 VITALS — BP 118/60 | HR 75 | Ht 70.5 in | Wt 175.8 lb

## 2024-07-11 DIAGNOSIS — R0602 Shortness of breath: Secondary | ICD-10-CM | POA: Insufficient documentation

## 2024-07-11 DIAGNOSIS — Z951 Presence of aortocoronary bypass graft: Secondary | ICD-10-CM | POA: Diagnosis not present

## 2024-07-11 DIAGNOSIS — E1122 Type 2 diabetes mellitus with diabetic chronic kidney disease: Secondary | ICD-10-CM | POA: Insufficient documentation

## 2024-07-11 DIAGNOSIS — Z952 Presence of prosthetic heart valve: Secondary | ICD-10-CM | POA: Insufficient documentation

## 2024-07-11 DIAGNOSIS — Z794 Long term (current) use of insulin: Secondary | ICD-10-CM

## 2024-07-11 DIAGNOSIS — Z9889 Other specified postprocedural states: Secondary | ICD-10-CM

## 2024-07-11 DIAGNOSIS — I639 Cerebral infarction, unspecified: Secondary | ICD-10-CM

## 2024-07-11 DIAGNOSIS — I5032 Chronic diastolic (congestive) heart failure: Secondary | ICD-10-CM | POA: Insufficient documentation

## 2024-07-11 DIAGNOSIS — I4821 Permanent atrial fibrillation: Secondary | ICD-10-CM | POA: Diagnosis not present

## 2024-07-11 DIAGNOSIS — G4733 Obstructive sleep apnea (adult) (pediatric): Secondary | ICD-10-CM | POA: Insufficient documentation

## 2024-07-11 DIAGNOSIS — Z8673 Personal history of transient ischemic attack (TIA), and cerebral infarction without residual deficits: Secondary | ICD-10-CM | POA: Diagnosis not present

## 2024-07-11 DIAGNOSIS — E782 Mixed hyperlipidemia: Secondary | ICD-10-CM

## 2024-07-11 DIAGNOSIS — Z9884 Bariatric surgery status: Secondary | ICD-10-CM | POA: Diagnosis not present

## 2024-07-11 DIAGNOSIS — I130 Hypertensive heart and chronic kidney disease with heart failure and stage 1 through stage 4 chronic kidney disease, or unspecified chronic kidney disease: Secondary | ICD-10-CM | POA: Diagnosis not present

## 2024-07-11 DIAGNOSIS — Z7901 Long term (current) use of anticoagulants: Secondary | ICD-10-CM | POA: Diagnosis not present

## 2024-07-11 DIAGNOSIS — R5383 Other fatigue: Secondary | ICD-10-CM | POA: Diagnosis not present

## 2024-07-11 DIAGNOSIS — N184 Chronic kidney disease, stage 4 (severe): Secondary | ICD-10-CM | POA: Diagnosis not present

## 2024-07-11 DIAGNOSIS — Z79899 Other long term (current) drug therapy: Secondary | ICD-10-CM | POA: Insufficient documentation

## 2024-07-11 DIAGNOSIS — I2510 Atherosclerotic heart disease of native coronary artery without angina pectoris: Secondary | ICD-10-CM | POA: Diagnosis not present

## 2024-07-11 DIAGNOSIS — Z7985 Long-term (current) use of injectable non-insulin antidiabetic drugs: Secondary | ICD-10-CM | POA: Diagnosis not present

## 2024-07-11 LAB — CBC
HCT: 30.5 % — ABNORMAL LOW (ref 39.0–52.0)
Hemoglobin: 10.3 g/dL — ABNORMAL LOW (ref 13.0–17.0)
MCH: 32.5 pg (ref 26.0–34.0)
MCHC: 33.8 g/dL (ref 30.0–36.0)
MCV: 96.2 fL (ref 80.0–100.0)
Platelets: 175 K/uL (ref 150–400)
RBC: 3.17 MIL/uL — ABNORMAL LOW (ref 4.22–5.81)
RDW: 13.6 % (ref 11.5–15.5)
WBC: 6.6 K/uL (ref 4.0–10.5)
nRBC: 0 % (ref 0.0–0.2)

## 2024-07-11 LAB — BASIC METABOLIC PANEL WITH GFR
Anion gap: 11 (ref 5–15)
BUN: 68 mg/dL — ABNORMAL HIGH (ref 8–23)
CO2: 21 mmol/L — ABNORMAL LOW (ref 22–32)
Calcium: 9.7 mg/dL (ref 8.9–10.3)
Chloride: 103 mmol/L (ref 98–111)
Creatinine, Ser: 5.02 mg/dL — ABNORMAL HIGH (ref 0.61–1.24)
GFR, Estimated: 11 mL/min — ABNORMAL LOW (ref >60)
Glucose, Bld: 215 mg/dL — ABNORMAL HIGH (ref 70–99)
Potassium: 4 mmol/L (ref 3.5–5.1)
Sodium: 135 mmol/L (ref 135–145)

## 2024-07-11 LAB — IRON AND TIBC
Iron: 75 ug/dL (ref 45–182)
Saturation Ratios: 30 % (ref 17.9–39.5)
TIBC: 249 ug/dL — ABNORMAL LOW (ref 250–450)
UIBC: 174 ug/dL

## 2024-07-11 LAB — FERRITIN: Ferritin: 155 ng/mL (ref 24–336)

## 2024-07-11 LAB — TSH: TSH: 1.947 u[IU]/mL (ref 0.350–4.500)

## 2024-07-11 MED ORDER — TORSEMIDE 20 MG PO TABS: ORAL_TABLET | ORAL | 3 refills | Status: AC

## 2024-07-11 NOTE — Patient Instructions (Signed)
 Medication Changes:  CONTINUE TORSEMIDE 60MG  IN THE MORNING, AND 40MG  IN THE EVENING  Lab Work:  Labs done today, your results will be available in MyChart, we will contact you for abnormal readings.  Follow-Up in:  6 MONTHS PLEASE CALL OUR OFFICE AROUND MARCH 2026 TO GET SCHEDULED FOR YOUR APPOINTMENT. PHONE NUMBER IS [phone] OPTION 2   At the [facility], you and your health needs are our priority. We have a designated team specialized in the treatment of Heart Failure. This Care Team includes your primary Heart Failure Specialized Cardiologist (physician), Advanced Practice Providers (APPs- Physician Assistants and Nurse Practitioners), and Pharmacist who all work together to provide you with the care you need, when you need it.   You may see any of the following providers on your designated Care Team at your next follow up:  Dr. Toribio Fuel Dr. Ezra Shuck Dr. Odis Brownie Greig Mosses, NP Caffie Shed, [place] [facility] [place], [place] Beckey Coe, NP Jordan Lee, NP Tinnie Redman, PharmD   Please be sure to bring in all your medications bottles to every appointment.   Need to Contact Us :  If you have any questions or concerns before your next appointment please send us  a message through [place] or call our office at [phone].    TO LEAVE A MESSAGE FOR THE NURSE SELECT OPTION 2, PLEASE LEAVE A MESSAGE INCLUDING: YOUR NAME DATE OF BIRTH CALL BACK NUMBER REASON FOR CALL**this is important as we prioritize the call backs  YOU WILL RECEIVE A CALL BACK THE SAME DAY AS LONG AS YOU CALL BEFORE 4:00 PM

## 2024-07-11 NOTE — Progress Notes (Signed)
 ReDS Vest / Clip - 07/11/24 1356       ReDS Vest / Clip   Station Marker C    Ruler Value 34    ReDS Value Range Low volume    ReDS Actual Value 32

## 2024-07-17 ENCOUNTER — Encounter (HOSPITAL_COMMUNITY)

## 2024-07-19 ENCOUNTER — Ambulatory Visit (HOSPITAL_COMMUNITY)
Admission: RE | Admit: 2024-07-19 | Discharge: 2024-07-19 | Disposition: A | Discharge status: Home / Self Care | Source: Ambulatory Visit | Attending: Cardiology | Admitting: Cardiology

## 2024-07-19 DIAGNOSIS — I5032 Chronic diastolic (congestive) heart failure: Secondary | ICD-10-CM | POA: Insufficient documentation

## 2024-07-19 LAB — BASIC METABOLIC PANEL WITH GFR
Anion gap: 11 (ref 5–15)
BUN: 72 mg/dL — ABNORMAL HIGH (ref 8–23)
CO2: 19 mmol/L — ABNORMAL LOW (ref 22–32)
Calcium: 9.8 mg/dL (ref 8.9–10.3)
Chloride: 102 mmol/L (ref 98–111)
Creatinine, Ser: 4.88 mg/dL — ABNORMAL HIGH (ref 0.61–1.24)
GFR, Estimated: 11 mL/min — ABNORMAL LOW (ref >60)
Glucose, Bld: 191 mg/dL — ABNORMAL HIGH (ref 70–99)
Potassium: 4.7 mmol/L (ref 3.5–5.1)
Sodium: 132 mmol/L — ABNORMAL LOW (ref 135–145)

## 2024-07-25 ENCOUNTER — Ambulatory Visit (HOSPITAL_COMMUNITY): Payer: Self-pay | Admitting: Family Medicine

## 2024-08-03 ENCOUNTER — Emergency Department (HOSPITAL_BASED_OUTPATIENT_CLINIC_OR_DEPARTMENT_OTHER)
Admission: EM | Admit: 2024-08-03 | Discharge: 2024-08-03 | Disposition: A | Discharge status: Home / Self Care | Attending: Emergency Medicine | Admitting: Emergency Medicine

## 2024-08-03 ENCOUNTER — Emergency Department (HOSPITAL_BASED_OUTPATIENT_CLINIC_OR_DEPARTMENT_OTHER)

## 2024-08-03 ENCOUNTER — Other Ambulatory Visit: Payer: Self-pay

## 2024-08-03 ENCOUNTER — Encounter (HOSPITAL_BASED_OUTPATIENT_CLINIC_OR_DEPARTMENT_OTHER): Payer: Self-pay | Admitting: Emergency Medicine

## 2024-08-03 DIAGNOSIS — I5032 Chronic diastolic (congestive) heart failure: Secondary | ICD-10-CM | POA: Diagnosis not present

## 2024-08-03 DIAGNOSIS — W1830XA Fall on same level, unspecified, initial encounter: Secondary | ICD-10-CM | POA: Insufficient documentation

## 2024-08-03 DIAGNOSIS — R296 Repeated falls: Secondary | ICD-10-CM | POA: Diagnosis not present

## 2024-08-03 DIAGNOSIS — I2510 Atherosclerotic heart disease of native coronary artery without angina pectoris: Secondary | ICD-10-CM | POA: Diagnosis not present

## 2024-08-03 DIAGNOSIS — S51011A Laceration without foreign body of right elbow, initial encounter: Secondary | ICD-10-CM | POA: Diagnosis present

## 2024-08-03 DIAGNOSIS — N189 Chronic kidney disease, unspecified: Secondary | ICD-10-CM | POA: Diagnosis not present

## 2024-08-03 DIAGNOSIS — Z85828 Personal history of other malignant neoplasm of skin: Secondary | ICD-10-CM | POA: Diagnosis not present

## 2024-08-03 DIAGNOSIS — I4891 Unspecified atrial fibrillation: Secondary | ICD-10-CM | POA: Diagnosis not present

## 2024-08-03 DIAGNOSIS — E1122 Type 2 diabetes mellitus with diabetic chronic kidney disease: Secondary | ICD-10-CM | POA: Diagnosis not present

## 2024-08-03 DIAGNOSIS — I130 Hypertensive heart and chronic kidney disease with heart failure and stage 1 through stage 4 chronic kidney disease, or unspecified chronic kidney disease: Secondary | ICD-10-CM | POA: Diagnosis not present

## 2024-08-03 DIAGNOSIS — Z951 Presence of aortocoronary bypass graft: Secondary | ICD-10-CM | POA: Insufficient documentation

## 2024-08-03 DIAGNOSIS — Z7901 Long term (current) use of anticoagulants: Secondary | ICD-10-CM | POA: Diagnosis not present

## 2024-08-03 DIAGNOSIS — Z8673 Personal history of transient ischemic attack (TIA), and cerebral infarction without residual deficits: Secondary | ICD-10-CM | POA: Diagnosis not present

## 2024-08-03 DIAGNOSIS — Z8616 Personal history of COVID-19: Secondary | ICD-10-CM | POA: Insufficient documentation

## 2024-08-03 DIAGNOSIS — R531 Weakness: Secondary | ICD-10-CM | POA: Diagnosis not present

## 2024-08-03 LAB — CBC WITH DIFFERENTIAL/PLATELET
Abs Immature Granulocytes: 0.05 K/uL (ref 0.00–0.07)
Basophils Absolute: 0 K/uL (ref 0.0–0.1)
Basophils Relative: 0 %
Eosinophils Absolute: 0.2 K/uL (ref 0.0–0.5)
Eosinophils Relative: 2 %
HCT: 32.3 % — ABNORMAL LOW (ref 39.0–52.0)
Hemoglobin: 11 g/dL — ABNORMAL LOW (ref 13.0–17.0)
Immature Granulocytes: 1 %
Lymphocytes Relative: 11 %
Lymphs Abs: 0.9 K/uL (ref 0.7–4.0)
MCH: 32.6 pg (ref 26.0–34.0)
MCHC: 34.1 g/dL (ref 30.0–36.0)
MCV: 95.8 fL (ref 80.0–100.0)
Monocytes Absolute: 0.4 K/uL (ref 0.1–1.0)
Monocytes Relative: 6 %
Neutro Abs: 6.2 K/uL (ref 1.7–7.7)
Neutrophils Relative %: 80 %
Platelets: 155 K/uL (ref 150–400)
RBC: 3.37 MIL/uL — ABNORMAL LOW (ref 4.22–5.81)
RDW: 14.1 % (ref 11.5–15.5)
WBC: 7.7 K/uL (ref 4.0–10.5)
nRBC: 0 % (ref 0.0–0.2)

## 2024-08-03 LAB — BASIC METABOLIC PANEL WITH GFR
Anion gap: 15 (ref 5–15)
BUN: 61 mg/dL — ABNORMAL HIGH (ref 8–23)
CO2: 18 mmol/L — ABNORMAL LOW (ref 22–32)
Calcium: 10 mg/dL (ref 8.9–10.3)
Chloride: 103 mmol/L (ref 98–111)
Creatinine, Ser: 4.16 mg/dL — ABNORMAL HIGH (ref 0.61–1.24)
GFR, Estimated: 14 mL/min — ABNORMAL LOW (ref >60)
Glucose, Bld: 140 mg/dL — ABNORMAL HIGH (ref 70–99)
Potassium: 4.4 mmol/L (ref 3.5–5.1)
Sodium: 136 mmol/L (ref 135–145)

## 2024-08-03 LAB — TROPONIN T, HIGH SENSITIVITY
Troponin T High Sensitivity: 78 ng/L — ABNORMAL HIGH (ref 0–19)
Troponin T High Sensitivity: 76 ng/L — ABNORMAL HIGH (ref 0–19)

## 2024-08-03 MED ORDER — SODIUM CHLORIDE 0.9 % IV BOLUS
500.0000 mL | Freq: Once | INTRAVENOUS | Status: AC
  Administered 2024-08-03: 500 mL via INTRAVENOUS

## 2024-08-03 NOTE — Discharge Instructions (Addendum)
 Your lab work and CT scans were unremarkable.  Please follow closely with your primary care doctor.  Try and keep up with your eating and drinking at home as this can improve your symptoms.  If you develop any new or suddenly worsening symptoms you should return to the emergency department for reevaluation.

## 2024-08-03 NOTE — ED Triage Notes (Signed)
 Pt reports fall on thanksgiving hitting head and all over his body.  Pt reports another fall this morning but did not hit his head.  Reports fall today was not a trip or dizziness,  I just went down  Pt reports soreness all over from falls.

## 2024-08-03 NOTE — ED Provider Notes (Signed)
 Emergency Department Provider Note   I have reviewed the triage vital signs and the nursing notes.   HISTORY  Chief Complaint Fall and Fall on Thinners   HPI [name] is a 78 y.o. male past history reviewed below including A-fib on Eliquis  with prior stroke presents to the emergency department with several falls in the past few weeks.  He initially had a fall on Thanksgiving.  He simply felt like he fell to 1 side and had pain through several areas of his body but that is mostly resolved.  He had an additional fall on Tuesday without any clear inciting event.  He had a third fall this morning without head injury.  He states he simply went down.  He does not feel like he is passing out.  He declines having any chest discomfort or heart palpitations.  No changes to his medications or diet.  He is not feeling weakness or numbness.  He does not appreciate asymmetry in his strength.  No severe headaches.  He does have a small skin tear to the right elbow but is moving that arm and elbow without difficulty.  Past Medical History:  Diagnosis Date   Allergic rhinitis    Anxiety    Atrial fibrillation (HCC)    Cancer (HCC)    hx of skin cancers    Chronic kidney disease    Coronary artery disease    a. s/p MI & CABG; b. s/p PCI Diag;  c. Cath 12/2011: LAD diff dzs/small, Diag 75% isr, 90 dist to stent (small), LCX & RCA occluded, VG->OM 40, VG->PDA patent - med rx.   COVID-19    x 3   Depression    PTSD   Diabetes mellitus    not on medications   Diastolic CHF, chronic (HCC)    a. EF 55-60% by echo 2007   GERD (gastroesophageal reflux disease)    not anymore  I had a lap band   History of diverticulitis of colon    5 YRS AGO   History of kidney stones    History of pneumonia    2017   History of transient ischemic attack (TIA)    CAROTID DOPPLERS NOV 2011  0-39& BIL. 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 Positive frequent falls.  Cardiovascular: Denies chest pain. Respiratory: Denies shortness of breath. Gastrointestinal: No abdominal pain.  No nausea, no vomiting.  Genitourinary: Negative for dysuria. Musculoskeletal: Negative for back pain. Skin: Negative for rash. Neurological: Negative for headaches, focal weakness or numbness.   ____________________________________________   PHYSICAL EXAM:  VITAL SIGNS: ED Triage Vitals  Encounter Vitals Group     BP 08/03/24 1523 114/81     Pulse Rate 08/03/24 1523 91     Resp 08/03/24 1523 18     Temp 08/03/24 1523 (!) 97.4 F (36.3 C)     Temp Source 08/03/24 1523 Oral     SpO2 08/03/24 1523 100 %   Constitutional: Alert and oriented. Well appearing and in no acute distress. Eyes: Conjunctivae are normal.  Head: Atraumatic. Nose: No congestion/rhinnorhea. Mouth/Throat: Mucous membranes are moist.   Neck: No stridor.  No cervical spine tenderness to palpation. Cardiovascular: Normal rate, regular rhythm. Good peripheral circulation. Grossly normal heart sounds.   Respiratory: Normal respiratory effort.  No retractions. Lungs CTAB. Gastrointestinal: Soft and nontender. No distention.  Musculoskeletal: No lower extremity tenderness  nor edema. No gross deformities of extremities.  Normal range of motion of the bilateral hips and upper extremities.  Patient is ambulatory in the ED without assistance. Neurologic:  Normal speech and language. No gross focal neurologic deficits are appreciated.  Skin:  Skin is warm, dry.  Several day old appearing skin tear to the right elbow.   ____________________________________________   LABS (all labs ordered are listed, but only abnormal results are displayed)  Labs Reviewed  BASIC METABOLIC PANEL WITH GFR - Abnormal; Notable for the following components:      Result Value   CO2 18 (*)    Glucose, Bld 140 (*)    BUN 61 (*)    Creatinine, Ser 4.16 (*)    GFR, Estimated 14 (*)    All other components within normal limits  CBC WITH DIFFERENTIAL/PLATELET - Abnormal; Notable for the following components:   RBC 3.37 (*)    Hemoglobin 11.0 (*)    HCT 32.3 (*)    All other components within normal limits  TROPONIN T, HIGH SENSITIVITY - Abnormal; Notable for the following components:   Troponin T High Sensitivity 78 (*)    All other components within normal limits  TROPONIN T, HIGH SENSITIVITY - Abnormal; Notable for the following components:   Troponin T High Sensitivity 76 (*)    All other components within normal limits   ____________________________________________  EKG   EKG Interpretation Date/Time:  Thursday August 03 2024 15:53:01 EST Ventricular Rate:  81 PR Interval:    QRS Duration:  150 QT Interval:  400 QTC Calculation: 465 R Axis:   -84  Text Interpretation: Atrial fibrillation RBBB and LAFB Confirmed by Darra Chew [phone]) on 08/03/2024 4:07:12 PM        ____________________________________________  RADIOLOGY  CT Head Wo Contrast Result Date: 08/03/2024 CLINICAL DATA:  Two falls with most recent 1 today. Patient on Eliquis . EXAM: CT HEAD WITHOUT CONTRAST TECHNIQUE: Contiguous axial images were obtained from the base of the skull through the  vertex without intravenous contrast. RADIATION DOSE REDUCTION: This exam was performed according to the departmental dose-optimization program which includes automated exposure control, adjustment of the mA and/or kV according to patient size and/or use of iterative reconstruction technique. COMPARISON:  02/01/2021 FINDINGS: Brain: Ventricles, cisterns and other CSF spaces are normal. There is moderate chronic ischemic microvascular disease present. There is no mass, mass effect, shift of midline structures or acute hemorrhage. Vascular: No hyperdense vessel or unexpected calcification. Skull: Normal. Negative for fracture or focal lesion. Sinuses/Orbits: Orbits are normal and symmetric. Paranasal sinuses are clear. Other: None. IMPRESSION: 1. No acute findings. 2. Moderate chronic ischemic microvascular disease. Electronically Signed   By:  Toribio Agreste M.D.   On: 08/03/2024 17:19    ____________________________________________   PROCEDURES  Procedure(s) performed:   Procedures  None  ____________________________________________   INITIAL IMPRESSION / ASSESSMENT AND PLAN / ED COURSE  Pertinent labs & imaging results that were available during my care of the patient were reviewed by me and considered in my medical decision making (see chart for details).   This patient is Presenting for Evaluation of falls, which does require a range of treatment options, and is a complaint that involves a high risk of morbidity and mortality.  The Differential Diagnoses includes subdural hematoma, epidural hematoma, acute concussion, traumatic subarachnoid hemorrhage, cerebral contusions, etc.  Clinical Laboratory Tests Ordered, included   Radiologic Tests Ordered, included CT head. I independently interpreted the images and agree with radiology interpretation.   Cardiac Monitor Tracing which shows A fib (rate controlled).    Social Determinants of Health Risk patient is not an active smoker.     Medical Decision Making: Summary:  Patient presents emergency department for evaluation of frequent falling.  No focal neurodeficits.  He is ambulatory here without assistance.  He is anticoagulated and at least one of the falls has involved head injury.  Plan for CT imaging of the head along with basic screening blood work and reassess.  No significant vital sign abnormality to suggest the or developing sepsis.  He looks well and has no signs of acute stroke on my exam.  Reevaluation with update and discussion with patient. Labs and imaging reassuring. Troponin is flat and likely elevated in the setting of CKD. Patient ambulatory here and feeling improved after IVF. Wife at bedside reports poor PO intake. Stable for discharge with close PCP follow up (tomorrow at 1 PM).   Considered admission but ED workup largely unremarkable.   Patient's presentation is most consistent with acute presentation with potential threat to life or bodily function.   Disposition: discharge  ____________________________________________  FINAL CLINICAL IMPRESSION(S) / ED DIAGNOSES  Final diagnoses:  Frequent falls  Weakness    Note:  This document was prepared using Dragon voice recognition software and may include unintentional dictation errors.  Fonda Law, MD, [facility] Emergency Medicine    Kishawn Pickar, Fonda MATSU, MD 08/04/24 [phone]

## 2024-09-05 ENCOUNTER — Encounter: Payer: Self-pay | Admitting: Internal Medicine

## 2024-09-05 ENCOUNTER — Ambulatory Visit: Admitting: Internal Medicine

## 2024-09-05 VITALS — BP 120/70 | HR 81 | Ht 70.5 in | Wt 175.2 lb

## 2024-09-05 DIAGNOSIS — E782 Mixed hyperlipidemia: Secondary | ICD-10-CM | POA: Diagnosis not present

## 2024-09-05 DIAGNOSIS — E1159 Type 2 diabetes mellitus with other circulatory complications: Secondary | ICD-10-CM

## 2024-09-05 DIAGNOSIS — E1165 Type 2 diabetes mellitus with hyperglycemia: Secondary | ICD-10-CM

## 2024-09-05 LAB — POCT GLYCOSYLATED HEMOGLOBIN (HGB A1C): Hemoglobin A1C: 6.9 % — AB (ref 4.0–5.6)

## 2024-09-05 MED ORDER — OZEMPIC (0.25 OR 0.5 MG/DOSE) 2 MG/1.5ML ~~LOC~~ SOPN: 0.2500 mg | PEN_INJECTOR | SUBCUTANEOUS | Status: AC

## 2024-09-05 NOTE — Progress Notes (Addendum)
 Patient ID: [name], male   DOB: May 30, 1946, 79 y.o.   MRN: [id number]   HPI: [name] is a 79 y.o.-year-old male, initially referred by his nephrologist, Dr. 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 fib, CKD stage V, cerebrovascular disease, Aortic atherosclerosis, s/p TIA, ED).  Last visit 3 months ago.  He is here accompanied by his wife, who is also my patient.  She offers part of the history especially relating to medication, past medical history, and diet.  Interim history: No blurry vision, nausea, chest pain. At last visit, wife was concerned about patient's decreased appetite.  We reduced the dose of Ozempic .  Since then, he was in the emergency room with frequent falls, weakness in 07/2024.  Glucose levels were not low.  Troponins were not elevated.  CT head was negative for acute events.  She felt better after hydration.  He was discharged with close PCP follow-up. Now at baseline with stable weight. He has constipation  - on Linzess.  Reviewed HbA1c levels: Lab Results  Component Value Date   HGBA1C 7.9 (A) 06/05/2024   HGBA1C 7.0 (A) 01/04/2024   HGBA1C 7.4 (A) 07/15/2023   HGBA1C 6.8 (A) 01/11/2023   HGBA1C 6.8 (A) 09/10/2022   HGBA1C 6.8 (H) 05/19/2022   HGBA1C 5.9 (A) 02/25/2022   HGBA1C 6.3 (A) 10/30/2021   HGBA1C 6.4 (A) 06/19/2021   HGBA1C 6.2 (H) 02/02/2021  04/10/2024: HbA1c 7.7% 08/23/2023: HbA1c 7.8% ([facility]) 09/24/2021: HbA1c 5.8% 01/11/2019: HbA1c 8.3% 10/04/2018: HbA1c 8.2%  He is currently on: - Ozempic  0.5 >> 1 mg weekly - through the [place] >> 0.25 mg weekly (misunderstood instruction) - Lantus  8 units daily-started 05/2024 >> only when he needs it (!?) - 1 a month now - Glipizide  2.5 to 5 mg as needed before a  larger dinner >> does not take it He came off metformin  due to worsening kidney function. We stopped glipizide  09/2020  due to good control.  He was not checking sugars at last visit.  Now using the CGM -he is using his wife's old receiver so her name is appearing on the report.  His wife is now using her phone.   -he had a defective sensor recently:  Previously: - am:  90-110, 120 >> 127,168 >> 105, 127-181 >> 90-110 - 2h after b'fast:  90, 110 >> n/c >> 140 >> 153 >> n/c - before lunch: n/c >> 102-130 >> n/c >> 110 - 2h after lunch: 134, 165 >> n/c >> 155 >> 133 >> n/c - before dinner: 129 >> n/c >> 86 >> n/c - 2h after dinner: n/c >> 190 >> n/c >> 110 - bedtime: n/c >> 77-122 >> n/c >> 120 >> 157 >> n/c - nighttime: n/c >> 180 (sick - steroids) Lowest sugar was 69 >> ...  115 >> 105 >> 90; he has hypoglycemia awareness in the 70s. Highest sugar was 299 >> ... 168 >> 181 (pasta the night before) >> candy, icecream: 200  Pt's meals are: - Breakfast: egg and bacon (not lately).   - Brunch: meat + veggies - Dinner: same as lunch - Snacks: crackers, popcorn   -He has stage V CKD-sees nephrology (Dr. Teri not have to start dialysis yet but has a fistula placed; last BUN/creatinine:  Lab Results  Component Value Date   BUN 61 (H) 08/03/2024   BUN 72 (H) 07/19/2024   CREATININE  4.16 (H) 08/03/2024   CREATININE 4.88 (H) 07/19/2024  He is not on ACE inhibitor/ARB.  -+ HL; last set of lipids: Lab Results  Component Value Date   CHOL 72 01/14/2024   HDL 39 (L) 01/14/2024   LDLCALC 20 01/14/2024   TRIG 64 01/14/2024   CHOLHDL 1.8 01/14/2024  He developed generalized aches and pains from pravastatin .  He restarted Zetia  12/2021 by cardiology.  He is also on Praluent .  - last eye exam was in 03/2024: Reportedly no DR ([facility]).   - no numbness and tingling in his feet. + pain. Last foot exam 06/05/2024.  Pt has FH of DM in sister and brother.  He also has hypertension. He has a stroke 12/2020 - weak R side of body, but improving. He was in PT. He sees neurology. He was admitted 02/01/2021 for  chest pain. He had AKI. He has a history of lap band surgery 2010. Lost from 373 lbs to 215-220 lbs. He was admitted 03/2022 with ureteric stone and hydronephrosis along with AKI.  He subsequently had to have cystoscopy/ureteroscopy. He was admitted for SBO 06/2023.  He recovered well.   04/10/2024: B12 vitamin 1171 01/23/2021: B12 vitamin 733, TSH 1.83  ROS: C+ see HPI  I reviewed pt's medications, allergies, PMH, social hx, family hx, and changes were documented in the history of present illness. Otherwise, unchanged from my initial visit note.  Past Medical History:  Diagnosis Date   Allergic rhinitis    Anxiety    Atrial fibrillation (HCC)    Cancer (HCC)    hx of skin cancers    Chronic kidney disease    Coronary artery disease    a. s/p MI & CABG; b. s/p PCI Diag;  c. Cath 12/2011: LAD diff dzs/small, Diag 75% isr, 90 dist to stent (small), LCX & RCA occluded, VG->OM 40, VG->PDA patent - med rx.   COVID-19    x 3   Depression    PTSD   Diabetes mellitus    not on medications   Diastolic CHF, chronic (HCC)    a. EF 55-60% by echo 2007   GERD (gastroesophageal reflux disease)    not anymore  I had a lap band   History of diverticulitis of colon    5 YRS AGO   History of kidney stones    History of pneumonia    2017   History of transient ischemic attack (TIA)    CAROTID DOPPLERS NOV 2011  0-39& BIL. 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   Complex valvuloplasty including artificial Gore-tex neochord placement x4, plication of anterior commissure, and 26 mm Sorin Memo 3D ring annuloplasty via right mini thoracotomy  approach   Shortness of breath dyspnea    with exertion   Skin cancer    Sleep apnea    cpap- see ov note in EPIC 05/12/11 for settings    Stroke [facility])    hx of   Stroke [facility])    Past Surgical History:  Procedure Laterality Date   AV FISTULA PLACEMENT Left 08/02/2018  Procedure: ARTERIOVENOUS (AV) FISTULA CREATION RADIOCEPHALIC;  Surgeon: Eliza Lonni RAMAN, MD;  Location: [facility] OR;  Service: Vascular;  Laterality: Left;   BLEPHAROPLASTY  1985   BILATERAL   CARDIAC CATHETERIZATION  2001, 2004, 2009, 2011   CARDIAC CATHETERIZATION N/A 01/23/2016   Procedure: Right/Left Heart Cath and Coronary Angiography;  Surgeon: Ezra RAMAN Shuck, MD;  Location: [facility] INVASIVE CV LAB;  Service: Cardiovascular;  Laterality: N/A;   CARDIOVERSION N/A 09/07/2017   Procedure: CARDIOVERSION;  Surgeon: Shuck Ezra RAMAN, MD;  Location: [facility] ENDOSCOPY;  Service: Cardiovascular;  Laterality: N/A;   CARDIOVERSION N/A 09/13/2019   Procedure: CARDIOVERSION;  Surgeon: Shuck Ezra RAMAN, MD;  Location: [facility] ENDOSCOPY;  Service: Cardiovascular;  Laterality: N/A;   CARDIOVERSION N/A 10/19/2019   Procedure: CARDIOVERSION;  Surgeon: Shuck Ezra RAMAN, MD;  Location: [facility] ENDOSCOPY;  Service: Cardiovascular;  Laterality: N/A;   CARDIOVERSION N/A 04/14/2022   Procedure: CARDIOVERSION;  Surgeon: Shuck Ezra RAMAN, MD;  Location: [facility] ENDOSCOPY;  Service: Cardiovascular;  Laterality: N/A;   CARDIOVERSION N/A 02/22/2023   Procedure: CARDIOVERSION;  Surgeon: Shuck Ezra RAMAN, MD;  Location: [facility] INVASIVE CV LAB;  Service: Cardiovascular;  Laterality: N/A;   CARDIOVERSION N/A 03/12/2023   Procedure: CARDIOVERSION;  Surgeon: Shuck Ezra RAMAN, MD;  Location: [facility] INVASIVE CV LAB;  Service: Cardiovascular;  Laterality: N/A;   CATARACT EXTRACTION W/ INTRAOCULAR LENS IMPLANT Bilateral    CHONDROPLASTY  07/22/2011   Procedure: CHONDROPLASTY;  Surgeon: Dempsey LULLA Moan;  Location: [facility];  Service: Orthopedics;;   CIRCUMCISION  35 YRS   AGO   COLONOSCOPY     CORONARY ANGIOPLASTY  1996-- POST CABG   W/ STENT, last cath 01/07/2012    CORONARY ARTERY BYPASS GRAFT  1995   X3 VESSEL   CORONARY ARTERY BYPASS GRAFT  1995   CORONARY STENT PLACEMENT     CYSTOSCOPY W/ URETERAL STENT PLACEMENT Right 04/11/2022   Procedure: CYSTOSCOPY WITH RETROGRADE PYELOGRAM/URETERAL STENT PLACEMENT;  Surgeon: Sherrilee Belvie CROME, MD;  Location: [facility] ORS;  Service: Urology;  Laterality: Right;   CYSTOSCOPY/URETEROSCOPY/HOLMIUM LASER/STENT PLACEMENT Right 01/08/2021   Procedure: CYSTOSCOPY/RETROGRADE/URETEROSCOPY/HOLMIUM LASER/STENT PLACEMENT;  Surgeon: Matilda Senior, MD;  Location: [facility] ORS;  Service: Urology;  Laterality: Right;   CYSTOSCOPY/URETEROSCOPY/HOLMIUM LASER/STENT PLACEMENT Right 05/27/2022   Procedure: CYSTOSCOPY, URETEROSCOPY, HOLMIUM LASER, STENT EXCHANGE;  Surgeon: Devere Lonni Righter, MD;  Location: [facility]S;  Service: Urology;  Laterality: Right;   INCISION AND DRAINAGE PERIRECTAL ABSCESS N/A 09/01/2021   Procedure: IRRIGATION AND DEBRIDEMENT PERIRECTAL ABSCESS;  Surgeon: Stevie Herlene Righter, MD;  Location: [facility] ORS;  Service: General;  Laterality: N/A;   IR PERC TUN PERIT CATH WO PORT S&I SHERRILL  03/06/2021   IR REMOVAL TUN CV CATH W/O FL  04/14/2021   KNEE ARTHROSCOPY  07/22/2011   Procedure: ARTHROSCOPY KNEE;  Surgeon: Dempsey LULLA Moan;  Location: [facility];  Service: Orthopedics;  Laterality: Right;  WITH DEBRIDEMENT    KNEE ARTHROSCOPY W/ MENISCECTOMY  X2 IN 2002-- RIGHT KNEE   LAPAROSCOPIC GASTRIC BANDING  01-15-09   LEFT HEART CATH AND CORONARY ANGIOGRAPHY N/A 04/29/2017   Procedure: LEFT HEART CATH AND CORONARY ANGIOGRAPHY;  Surgeon: Shuck Ezra RAMAN, MD;  Location: [facility] INVASIVE CV LAB;  Service: Cardiovascular;  Laterality: N/A;   LEFT HEART CATHETERIZATION WITH CORONARY ANGIOGRAM N/A 09/11/2013   Procedure: LEFT HEART CATHETERIZATION WITH CORONARY ANGIOGRAM;  Surgeon: Ezra RAMAN Shuck, MD;  Location: [facility] CATH LAB;   Service: Cardiovascular;  Laterality: N/A;   MITRAL VALVE REPAIR Right 03/25/2016  Procedure: MINIMALLY INVASIVE REOPERATION FOR MITRAL VALVE REPAIR  (MVR) with size 26 Sorin Memo 3D;  Surgeon: Sudie VEAR Laine, MD;  Location: [facility] OR;  Service: Open Heart Surgery;  Laterality: Right;   MITRAL VALVE REPAIR  03/2016   RIGHT KNEE ED COMPARTMENT REPLACEMENT  2004   SYNOVECTOMY  07/22/2011   Procedure: SYNOVECTOMY;  Surgeon: Dempsey GAILS Aluisio;  Location: [facility];  Service: Orthopedics;;   TEE WITHOUT CARDIOVERSION N/A 01/23/2016   Procedure: TRANSESOPHAGEAL ECHOCARDIOGRAM (TEE);  Surgeon: Ezra GORMAN Shuck, MD;  Location: [facility] ENDOSCOPY;  Service: Cardiovascular;  Laterality: N/A;   TEE WITHOUT CARDIOVERSION N/A 03/25/2016   Procedure: TRANSESOPHAGEAL ECHOCARDIOGRAM (TEE);  Surgeon: Sudie VEAR Laine, MD;  Location: [facility] OR;  Service: Open Heart Surgery;  Laterality: N/A;   TEE WITHOUT CARDIOVERSION N/A 03/04/2021   Procedure: TRANSESOPHAGEAL ECHOCARDIOGRAM (TEE);  Surgeon: Pietro Redell GORMAN, MD;  Location: [facility] ENDOSCOPY;  Service: Cardiovascular;  Laterality: N/A;   TEE WITHOUT CARDIOVERSION N/A 04/14/2022   Procedure: TRANSESOPHAGEAL ECHOCARDIOGRAM (TEE);  Surgeon: Shuck Ezra GORMAN, MD;  Location: [facility] ENDOSCOPY;  Service: Cardiovascular;  Laterality: N/A;   UMBILICAL HERNIA REPAIR  01-15-09   W/ GASTRIC BANDING PROCEDURE   Social History   Socioeconomic History   Marital status: Married    Spouse name: Not on file   Number of children: 1   Years of education: Not on file   Highest education level: Not on file  Occupational History   Contractor, semi-retired  Social Needs   Financial resource strain: Not on file   Food insecurity:    Worry: Not on file    Inability: Not on file   Transportation needs:    Medical: Not on file    Non-medical: Not on file  Tobacco Use   Smoking status: Never Smoker   Smokeless tobacco: Never Used  Substance and Sexual Activity   Alcohol use: Yes,  liquor    Comment: OCCASIONAL   Drug use: No   Sexual activity: Not on file  Lifestyle   Physical activity:    Days per week: Not on file    Minutes per session: Not on file   Stress: Not on file  Relationships   Social connections:    Talks on phone: Not on file    Gets together: Not on file    Attends religious service: Not on file    Active member of club or organization: Not on file    Attends meetings of clubs or organizations: Not on file    Relationship status: Not on file   Intimate partner violence:    Fear of current or ex partner: Not on file    Emotionally abused: Not on file    Physically abused: Not on file    Forced sexual activity: Not on file  Other Topics Concern   Not on file  Social History Narrative   Not on file   Current Outpatient Medications on File Prior to Visit  Medication Sig Dispense Refill   albuterol  (VENTOLIN  HFA) 108 (90 Base) MCG/ACT inhaler Inhale 1-2 puffs into the lungs every 6 (six) hours as needed for shortness of breath or wheezing.     Alirocumab  (PRALUENT ) 75 MG/ML SOAJ Inject 75 mg into the skin every 14 (fourteen) days.     allopurinol  (ZYLOPRIM ) 100 MG tablet Take 50 mg by mouth 2 (two) times a week.     apixaban  (ELIQUIS ) 5 MG TABS tablet Take 1 tablet (5 mg total) by mouth 2 (two)  times daily. 180 tablet 3   Blood Glucose Monitoring Suppl (ONETOUCH VERIO REFLECT) w/Device KIT Use as advised 1 kit 0   busPIRone (BUSPAR) 10 MG tablet Take 5 mg by mouth 2 (two) times daily.     cetirizine (ZYRTEC) 10 MG tablet Take 10 mg by mouth in the morning.     colchicine  0.6 MG tablet Take 0.6-1.2 mg by mouth 2 (two) times daily as needed (gout).     Continuous Glucose Sensor (FREESTYLE LIBRE 3 PLUS SENSOR) MISC Inject 1 Device into the skin continuous. Change every 15 days 6 each 3   Cyanocobalamin  (VITAMIN B-12) 5000 MCG TBDP Take 5,000 mcg by mouth 2 (two) times a week.     cycloSPORINE (RESTASIS) 0.05 % ophthalmic emulsion Place 1 drop into  both eyes 2 (two) times daily as needed (eye irritation.).     diclofenac  Sodium (VOLTAREN ) 1 % GEL Apply 2 g topically 3 (three) times daily as needed for pain.     docusate sodium  (COLACE) 100 MG capsule Take 200 mg by mouth at bedtime.     ezetimibe  (ZETIA ) 10 MG tablet Take 1 tablet (10 mg total) by mouth daily. 90 tablet 3   fluticasone  (FLONASE ) 50 MCG/ACT nasal spray Place 1 spray into both nostrils daily as needed for allergies or rhinitis.     glipiZIDE  (GLUCOTROL ) 5 MG tablet Take 0.5-1 tablets (2.5-5 mg total) by mouth daily before supper. (Patient taking differently: Take 2.5-5 mg by mouth daily as needed.) 60 tablet 3   HYDROcodone -acetaminophen  (NORCO) 10-325 MG tablet Take 1 tablet by mouth 3 (three) times daily as needed for moderate pain.     insulin  glargine (LANTUS  SOLOSTAR) 100 UNIT/ML Solostar Pen Inject 8-10 Units into the skin daily. 15 mL PRN   Insulin  Pen Needle 32G X 4 MM MISC Use 1x a day 100 each 3   Meclizine HCl 25 MG CHEW Chew 25 mg by mouth 3 (three) times daily as needed (dizziness).     methocarbamol  (ROBAXIN ) 500 MG tablet Take 500 mg by mouth 3 (three) times daily as needed for muscle spasms.     nitroGLYCERIN  (NITROSTAT ) 0.4 MG SL tablet 1 TAB UNDER THE TONGUE EVERY 5 MINUTES AS NEEDED FOR CHEST PAIN UP TO 3 DOSES, IF PERSIST CALL 911 25 tablet 3   ONETOUCH VERIO test strip 1 each by Other route in the morning and at bedtime. 200 each 5   pantoprazole  (PROTONIX ) 40 MG tablet Take 40 mg by mouth daily as needed (heartburn).     potassium chloride  SA (KLOR-CON  M) 20 MEQ tablet Take 2 tablets (40 mEq total) by mouth daily. 90 tablet 3   Semaglutide ,0.25 or 0.5MG /DOS, (OZEMPIC , 0.25 OR 0.5 MG/DOSE,) 2 MG/1.5ML SOPN Inject 0.5 mg into the skin once a week. 9 mL 3   tadalafil (CIALIS) 20 MG tablet Take 20 mg by mouth daily as needed for erectile dysfunction.     testosterone  cypionate (DEPOTESTOSTERONE CYPIONATE) 200 MG/ML injection Inject 200 mg into the muscle once a  week.     torsemide  (DEMADEX ) 20 MG tablet Take 3 tablets (60 mg total) by mouth in the morning AND 2 tablets (40 mg total) every evening. 180 tablet 3   traZODone  (DESYREL ) 100 MG tablet Take 100 mg by mouth at bedtime.     No current facility-administered medications on file prior to visit.   Allergies  Allergen Reactions   Dilaudid  [Hydromorphone  Hcl] Itching   Crestor  [Rosuvastatin ] Other (See Comments)  Muscle aches    Lipitor [Atorvastatin ] Other (See Comments)    Muscle aches   Pravastatin  Other (See Comments)    Muscle aches   Carvedilol  Other (See Comments)    loss of appetite   Metformin  Diarrhea   Serotonin Reuptake Inhibitors (Ssris) Other (See Comments)    Muscle aches   Zocor [Simvastatin] Other (See Comments)     Muscle pain   Codeine Itching and Other (See Comments)    Extremity tingling-- can take synthetic   Family History  Problem Relation Age of Onset   Other Mother        joint problems   Alzheimer's disease Mother    Hypertension Father    Heart disease Father    CVA Father        age 48   Depression Father    Heart disease Sister        CABG   Stroke Sister    Heart failure Sister        congestive   Diabetes Sister    Heart disease Brother    Hypertension Brother    Congestive Heart Failure Brother    PE: BP 120/70   Pulse 81   Ht 5' 10.5 (1.791 m)   Wt 175 lb 3.2 oz (79.5 kg)   SpO2 99%   BMI 24.78 kg/m  Wt Readings from Last 15 Encounters:  09/05/24 175 lb 3.2 oz (79.5 kg)  07/11/24 175 lb 12.8 oz (79.7 kg)  06/08/24 177 lb 9.6 oz (80.6 kg)  06/05/24 174 lb (78.9 kg)  03/24/24 181 lb (82.1 kg)  01/14/24 181 lb 3.2 oz (82.2 kg)  01/04/24 183 lb 12.8 oz (83.4 kg)  09/28/23 186 lb 12.8 oz (84.7 kg)  09/24/23 188 lb 0.8 oz (85.3 kg)  07/21/23 188 lb (85.3 kg)  07/15/23 192 lb 3.2 oz (87.2 kg)  06/09/23 196 lb 6.4 oz (89.1 kg)  05/21/23 199 lb 3.2 oz (90.4 kg)  04/15/23 202 lb 3.2 oz (91.7 kg)  04/08/23 203 lb 9.6 oz (92.4  kg)   Constitutional: overweight, in NAD Eyes: EOMI, no exophthalmos ENT: no thyromegaly, no cervical lymphadenopathy Cardiovascular: RRR, No MRG Respiratory: CTA B  Musculoskeletal: no deformities Skin: ecchymoses on bilateral dorsum of hands Neurological: + tremor with outstretched R hand  ASSESSMENT: 1. DM2, non-insulin -dependent, uncontrolled, with long-term complications - CAD, s/p coronaroplasty, then CABG 1995 - cardiologist Dr. Rolan - CHF, EF 55 to 65% - Atrial fibrillation, cardioversion in 2019 - Carotid artery disease - Cerebrovascular disease, s/p TIA, s/p stroke 12/2020 - Ao ATS per abdominal CT (08/31/2021) - CKD stage V, pending dialysis  - ED  2. HL  PLAN:  1. Patient with longstanding, previously uncontrolled type 2 diabetes, with worsening control.  At last visit, HbA1c was 7.7%, increased from 7.0%.  His last HbA1c obtained 3 months ago was even higher, at 7.9%. - At last visit, he was not checking blood sugars but HbA1c was higher and upon questioning, he was eating sweets and still losing weight and we discussed about trying to reduce the dose of Ozempic .  However, since her sugars are higher, I really advised him to add a low-dose of insulin . - Of note, he has a history of SBO and seeing GI.  No nausea, vomiting, abdominal pain for now. Jim interpretation: -At today's visit, we reviewed his CGM downloads: It appears that 72% of values are in target range (goal >70%), while 11% are higher than 180 (goal <25%), and 17%  are lower than 70 (goal <4%).  The calculated average blood sugar is 119.  The projected HbA1c for the next 3 months (GMI) is 6.2%. -Reviewing the CGM trends, sugars appear to be low in the last 5 days, however, these lows seem to be related to a defective sensor.  Indeed, he attached the new sensor today and the sugars are slightly elevated.  He does mention that he is not taking the insulin  as recommended at his last visit.  He initially was taking  it consistently but he had low blood sugars and he only took it today for the first time in the last month.  Therefore, I advised him to stay off insulin  for now.  Also, upon questioning, they misunderstood instructions about increasing the dose of Ozempic  from 1 mg to 0.5 mg weekly so he is taking only 0.25 mg weekly now.  Due to the previous failure to thrive, weakness, and weight loss, and also his constipation, I am not eager to increase his dose quite yet, but we may need to do so at next visit, depending on blood sugars and weight. - I suggested to:  Patient Instructions  Please continue: - Ozempic  0.25 mg weekly   Stay off the Lantus  for now.  Please return in 1.5 months.  - we checked his HbA1c: 6.9% (lower) - advised to check sugars at different times of the day - 4x a day, rotating check times - advised for yearly eye exams >> he is UTD - return to clinic in 4 months  2. HL - Latest lipid panel was reviewed from 12/2023: Fractions at goal with exception of a slightly low HDL: Lab Results  Component Value Date   CHOL 72 01/14/2024   HDL 39 (L) 01/14/2024   LDLCALC 20 01/14/2024   TRIG 64 01/14/2024   CHOLHDL 1.8 01/14/2024  - He could not tolerate statins due to muscle aches but he is on Zetia  10 mg daily and Praluent   Lela Fendt, MD PhD [facility] Endocrinology

## 2024-09-05 NOTE — Patient Instructions (Addendum)
 Please continue: - Ozempic  0.25 mg weekly   Stay off the Lantus  for now.  Please return in 1.5 months.

## 2024-09-08 ENCOUNTER — Other Ambulatory Visit (HOSPITAL_COMMUNITY): Payer: Self-pay | Admitting: Internal Medicine

## 2024-09-08 DIAGNOSIS — D631 Anemia in chronic kidney disease: Secondary | ICD-10-CM | POA: Insufficient documentation

## 2024-09-12 ENCOUNTER — Encounter (HOSPITAL_COMMUNITY): Payer: Self-pay | Admitting: Internal Medicine

## 2024-10-17 ENCOUNTER — Ambulatory Visit: Admitting: Internal Medicine

## 2025-01-09 ENCOUNTER — Ambulatory Visit (HOSPITAL_COMMUNITY): Admitting: Cardiology
